# Patient Record
Sex: Female | Born: 1950 | Race: White | Hispanic: No | Marital: Single | Smoking: Former smoker
Health system: Southern US, Community
[De-identification: ages and names within clinical notes are randomized; demographics above are authoritative.]

## PROBLEM LIST (undated history)

## (undated) DIAGNOSIS — I10 Essential (primary) hypertension: Secondary | ICD-10-CM

## (undated) DIAGNOSIS — I509 Heart failure, unspecified: Secondary | ICD-10-CM

## (undated) DIAGNOSIS — M1990 Unspecified osteoarthritis, unspecified site: Secondary | ICD-10-CM

## (undated) DIAGNOSIS — R519 Headache, unspecified: Secondary | ICD-10-CM

## (undated) DIAGNOSIS — E785 Hyperlipidemia, unspecified: Secondary | ICD-10-CM

## (undated) DIAGNOSIS — F0150 Vascular dementia without behavioral disturbance: Secondary | ICD-10-CM

## (undated) DIAGNOSIS — F32A Depression, unspecified: Secondary | ICD-10-CM

## (undated) DIAGNOSIS — T7840XA Allergy, unspecified, initial encounter: Secondary | ICD-10-CM

## (undated) DIAGNOSIS — J449 Chronic obstructive pulmonary disease, unspecified: Secondary | ICD-10-CM

## (undated) DIAGNOSIS — D649 Anemia, unspecified: Secondary | ICD-10-CM

## (undated) DIAGNOSIS — G20A1 Parkinson's disease without dyskinesia, without mention of fluctuations: Secondary | ICD-10-CM

## (undated) DIAGNOSIS — F419 Anxiety disorder, unspecified: Secondary | ICD-10-CM

## (undated) DIAGNOSIS — I341 Nonrheumatic mitral (valve) prolapse: Secondary | ICD-10-CM

## (undated) DIAGNOSIS — N189 Chronic kidney disease, unspecified: Secondary | ICD-10-CM

## (undated) DIAGNOSIS — K219 Gastro-esophageal reflux disease without esophagitis: Secondary | ICD-10-CM

## (undated) DIAGNOSIS — G20 Parkinson's disease: Secondary | ICD-10-CM

## (undated) DIAGNOSIS — F329 Major depressive disorder, single episode, unspecified: Secondary | ICD-10-CM

## (undated) HISTORY — DX: Hyperlipidemia, unspecified: E78.5

## (undated) HISTORY — PX: KNEE SURGERY: SHX244

## (undated) HISTORY — DX: Nonrheumatic mitral (valve) prolapse: I34.1

## (undated) HISTORY — PX: APPENDECTOMY: SHX54

## (undated) HISTORY — DX: Gastro-esophageal reflux disease without esophagitis: K21.9

## (undated) HISTORY — DX: Anxiety disorder, unspecified: F41.9

## (undated) HISTORY — DX: Unspecified osteoarthritis, unspecified site: M19.90

## (undated) HISTORY — DX: Essential (primary) hypertension: I10

## (undated) HISTORY — DX: Depression, unspecified: F32.A

## (undated) HISTORY — PX: ABDOMINAL HYSTERECTOMY: SHX81

## (undated) HISTORY — PX: TUBAL LIGATION: SHX77

## (undated) HISTORY — DX: Anemia, unspecified: D64.9

## (undated) HISTORY — DX: Allergy, unspecified, initial encounter: T78.40XA

## (undated) HISTORY — DX: Major depressive disorder, single episode, unspecified: F32.9

## (undated) SURGERY — ESOPHAGOGASTRODUODENOSCOPY (EGD) WITH PROPOFOL
Anesthesia: Monitor Anesthesia Care

---

## 1986-12-17 HISTORY — PX: BREAST SURGERY: SHX581

## 1998-03-31 ENCOUNTER — Inpatient Hospital Stay (HOSPITAL_COMMUNITY): Admission: RE | Admit: 1998-03-31 | Discharge: 1998-04-01 | Payer: Self-pay | Admitting: Obstetrics and Gynecology

## 1999-01-12 ENCOUNTER — Other Ambulatory Visit: Admission: RE | Admit: 1999-01-12 | Discharge: 1999-01-12 | Payer: Self-pay | Admitting: *Deleted

## 1999-12-25 ENCOUNTER — Other Ambulatory Visit: Admission: RE | Admit: 1999-12-25 | Discharge: 1999-12-25 | Payer: Self-pay | Admitting: Obstetrics and Gynecology

## 2000-01-05 ENCOUNTER — Emergency Department (HOSPITAL_COMMUNITY): Admission: EM | Admit: 2000-01-05 | Discharge: 2000-01-05 | Payer: Self-pay | Admitting: Emergency Medicine

## 2000-01-05 ENCOUNTER — Encounter: Payer: Self-pay | Admitting: Emergency Medicine

## 2000-08-12 ENCOUNTER — Encounter: Payer: Self-pay | Admitting: Family Medicine

## 2000-08-12 ENCOUNTER — Encounter: Admission: RE | Admit: 2000-08-12 | Discharge: 2000-08-12 | Payer: Self-pay | Admitting: Family Medicine

## 2000-09-17 ENCOUNTER — Inpatient Hospital Stay (HOSPITAL_COMMUNITY): Admission: EM | Admit: 2000-09-17 | Discharge: 2000-09-23 | Payer: Self-pay | Admitting: Emergency Medicine

## 2000-09-17 ENCOUNTER — Encounter: Payer: Self-pay | Admitting: Emergency Medicine

## 2000-11-26 ENCOUNTER — Inpatient Hospital Stay (HOSPITAL_COMMUNITY): Admission: EM | Admit: 2000-11-26 | Discharge: 2000-11-28 | Payer: Self-pay | Admitting: Emergency Medicine

## 2000-11-26 ENCOUNTER — Encounter: Payer: Self-pay | Admitting: *Deleted

## 2001-01-01 ENCOUNTER — Ambulatory Visit (HOSPITAL_COMMUNITY): Admission: RE | Admit: 2001-01-01 | Discharge: 2001-01-01 | Payer: Self-pay | Admitting: Internal Medicine

## 2001-02-01 ENCOUNTER — Ambulatory Visit (HOSPITAL_COMMUNITY): Admission: RE | Admit: 2001-02-01 | Discharge: 2001-02-01 | Payer: Self-pay | Admitting: Family Medicine

## 2001-02-01 ENCOUNTER — Encounter: Payer: Self-pay | Admitting: Family Medicine

## 2001-03-10 ENCOUNTER — Encounter: Payer: Self-pay | Admitting: Neurology

## 2001-03-10 ENCOUNTER — Encounter: Admission: RE | Admit: 2001-03-10 | Discharge: 2001-03-10 | Payer: Self-pay | Admitting: Neurology

## 2001-06-10 ENCOUNTER — Emergency Department (HOSPITAL_COMMUNITY): Admission: EM | Admit: 2001-06-10 | Discharge: 2001-06-10 | Payer: Self-pay | Admitting: *Deleted

## 2001-12-12 ENCOUNTER — Emergency Department (HOSPITAL_COMMUNITY): Admission: EM | Admit: 2001-12-12 | Discharge: 2001-12-12 | Payer: Self-pay | Admitting: Emergency Medicine

## 2001-12-12 ENCOUNTER — Encounter: Payer: Self-pay | Admitting: Emergency Medicine

## 2002-02-10 ENCOUNTER — Emergency Department (HOSPITAL_COMMUNITY): Admission: EM | Admit: 2002-02-10 | Discharge: 2002-02-10 | Payer: Self-pay | Admitting: Emergency Medicine

## 2002-02-10 ENCOUNTER — Encounter: Payer: Self-pay | Admitting: Emergency Medicine

## 2002-08-06 ENCOUNTER — Encounter: Payer: Self-pay | Admitting: Specialist

## 2002-08-06 ENCOUNTER — Encounter: Admission: RE | Admit: 2002-08-06 | Discharge: 2002-08-06 | Payer: Self-pay | Admitting: *Deleted

## 2003-06-24 ENCOUNTER — Ambulatory Visit (HOSPITAL_COMMUNITY): Admission: RE | Admit: 2003-06-24 | Discharge: 2003-06-24 | Payer: Self-pay | Admitting: Specialist

## 2003-12-18 HISTORY — PX: KNEE SURGERY: SHX244

## 2005-12-07 ENCOUNTER — Emergency Department (HOSPITAL_COMMUNITY): Admission: EM | Admit: 2005-12-07 | Discharge: 2005-12-07 | Payer: Self-pay | Admitting: Emergency Medicine

## 2006-04-15 ENCOUNTER — Emergency Department (HOSPITAL_COMMUNITY): Admission: EM | Admit: 2006-04-15 | Discharge: 2006-04-16 | Payer: Self-pay | Admitting: Emergency Medicine

## 2006-04-19 ENCOUNTER — Emergency Department (HOSPITAL_COMMUNITY): Admission: EM | Admit: 2006-04-19 | Discharge: 2006-04-19 | Payer: Self-pay | Admitting: Emergency Medicine

## 2006-06-06 ENCOUNTER — Encounter: Admission: RE | Admit: 2006-06-06 | Discharge: 2006-06-06 | Payer: Self-pay | Admitting: Ophthalmology

## 2006-06-11 ENCOUNTER — Emergency Department (HOSPITAL_COMMUNITY): Admission: EM | Admit: 2006-06-11 | Discharge: 2006-06-12 | Payer: Self-pay | Admitting: Emergency Medicine

## 2006-07-29 ENCOUNTER — Emergency Department (HOSPITAL_COMMUNITY): Admission: EM | Admit: 2006-07-29 | Discharge: 2006-07-29 | Payer: Self-pay | Admitting: Emergency Medicine

## 2006-10-01 ENCOUNTER — Emergency Department (HOSPITAL_COMMUNITY): Admission: EM | Admit: 2006-10-01 | Discharge: 2006-10-01 | Payer: Self-pay | Admitting: Emergency Medicine

## 2006-10-29 ENCOUNTER — Emergency Department (HOSPITAL_COMMUNITY): Admission: EM | Admit: 2006-10-29 | Discharge: 2006-10-29 | Payer: Self-pay | Admitting: Emergency Medicine

## 2007-01-15 ENCOUNTER — Emergency Department (HOSPITAL_COMMUNITY): Admission: EM | Admit: 2007-01-15 | Discharge: 2007-01-15 | Payer: Self-pay | Admitting: Emergency Medicine

## 2007-04-04 ENCOUNTER — Emergency Department (HOSPITAL_COMMUNITY): Admission: EM | Admit: 2007-04-04 | Discharge: 2007-04-04 | Payer: Self-pay | Admitting: Emergency Medicine

## 2007-04-21 IMAGING — CT CT CHEST W/ CM
2 of 4 series · 15 of 36 positions shown, 18 images · IV contrast (APPLIED)
Comparison: Chest x-ray 04/15/2006

CLINICAL DATA: Abnormal chest x-ray with lingular density.

CHEST CT WITH CONTRAST
TECHNIQUE: Multidetector CT imaging of the chest was performed following the
standard protocol during bolus administration of intravenous contrast.
Contrast:  100 cc Omnipaque 300

[Series 2: routine chest 5.0 st · axial · 0.62mm/px · z∈[-357,-57]mm · 12 of 70 slices shown, 15 images]
[im 5/70  mediastinal]
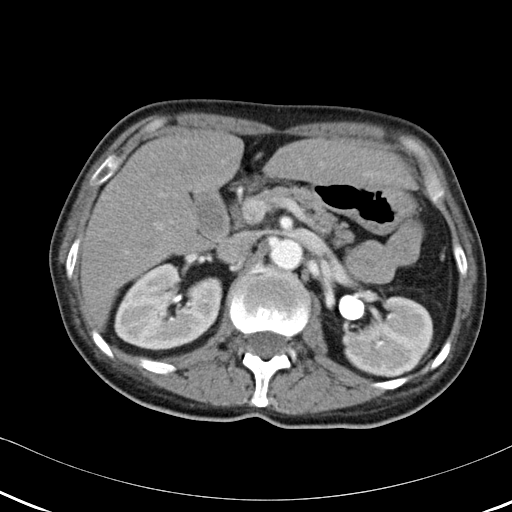
[im 5/70  lung]
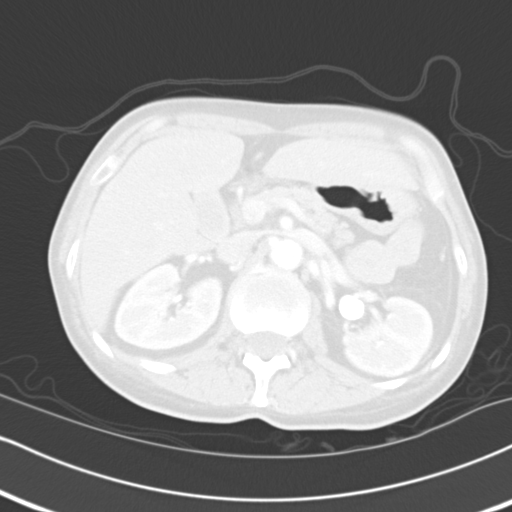
[im 10/70  lung]
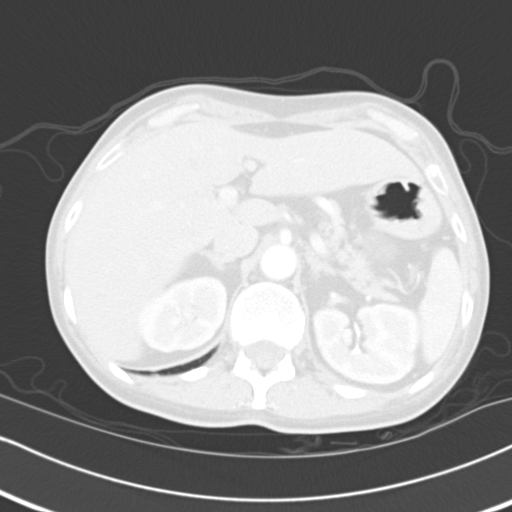
[im 15/70  lung]
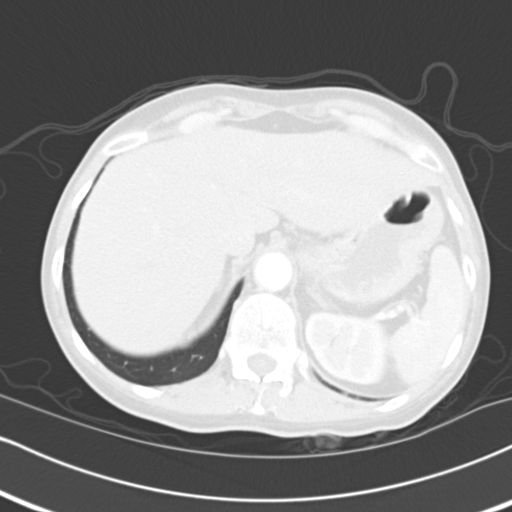
[im 20/70  lung]
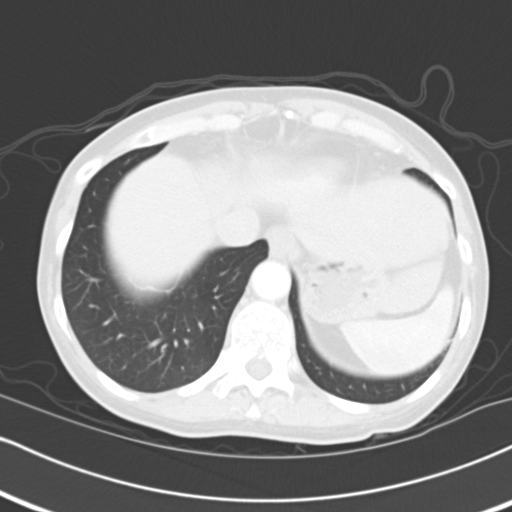
[im 25/70  mediastinal]
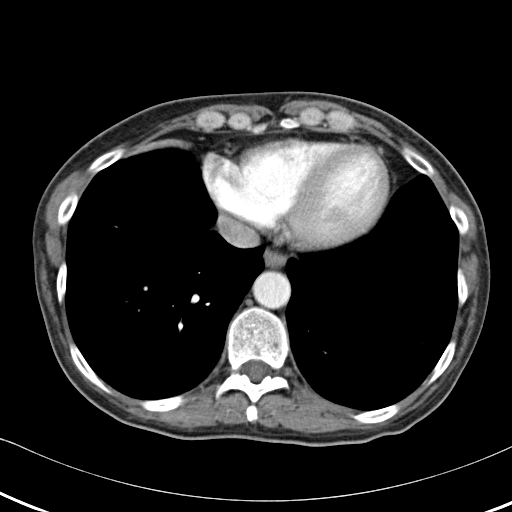
[im 25/70  lung]
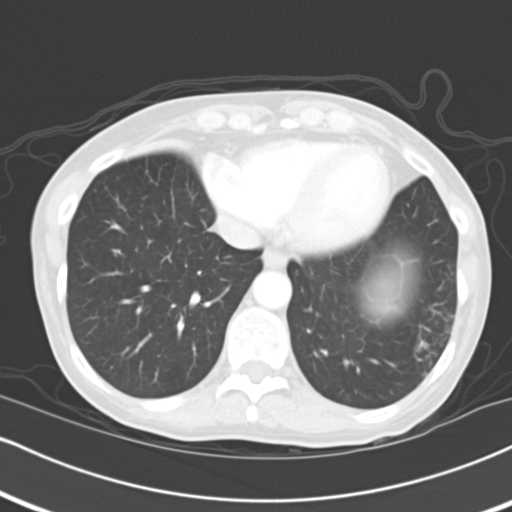
[im 30/70  lung]
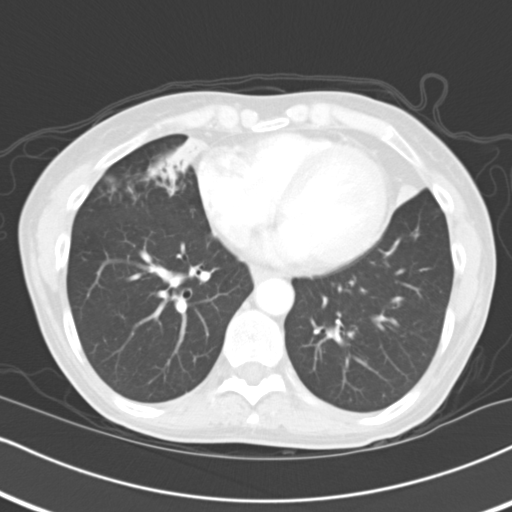
[im 40/70  lung]
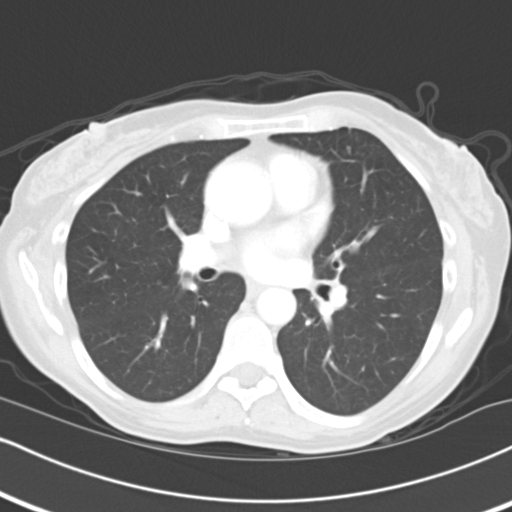
[im 45/70  lung]
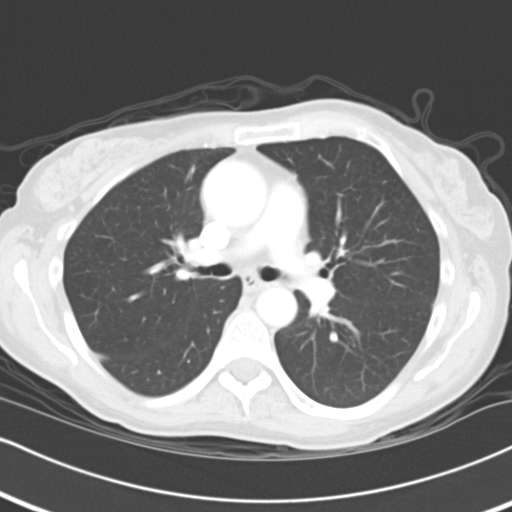
[im 50/70  mediastinal]
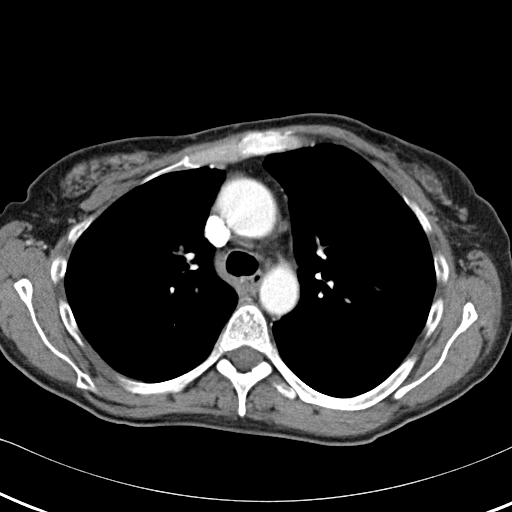
[im 50/70  lung]
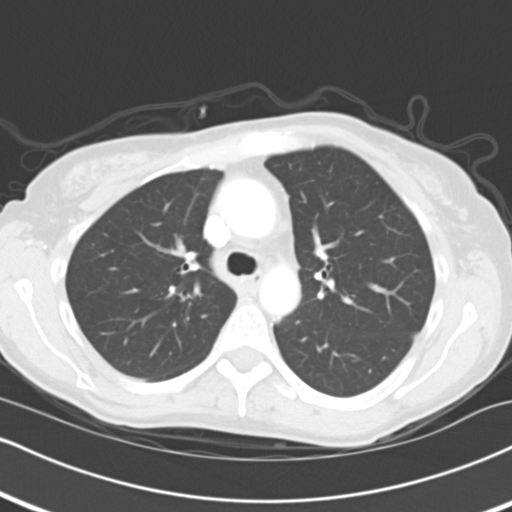
[im 55/70  lung]
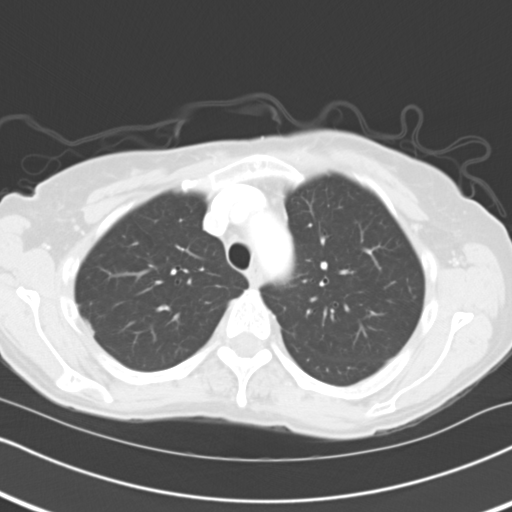
[im 60/70  lung]
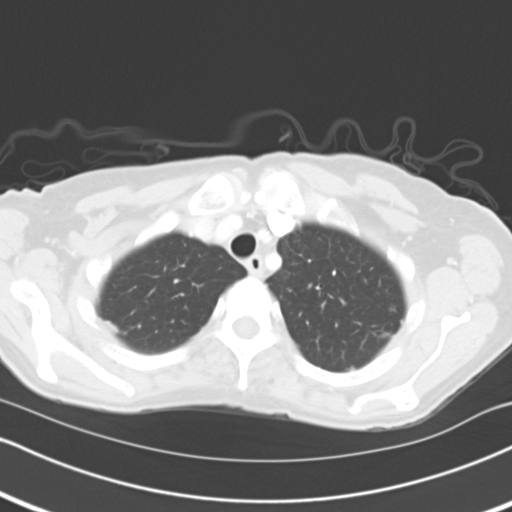
[im 65/70  lung]
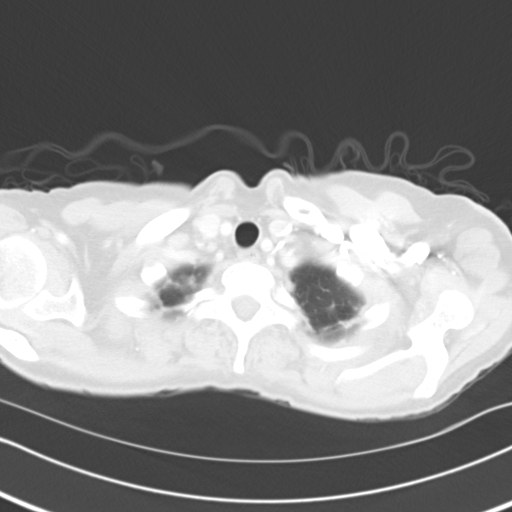

[Series 5: cor · coronal · 0.68mm/px · 3 of 103 slices shown]
[im 21/103  lung]
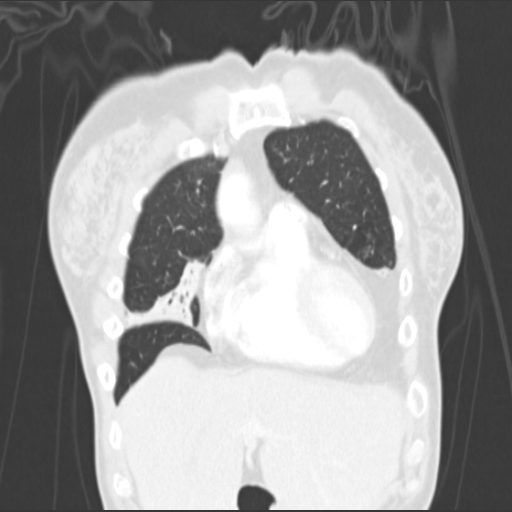
[im 41/103  lung]
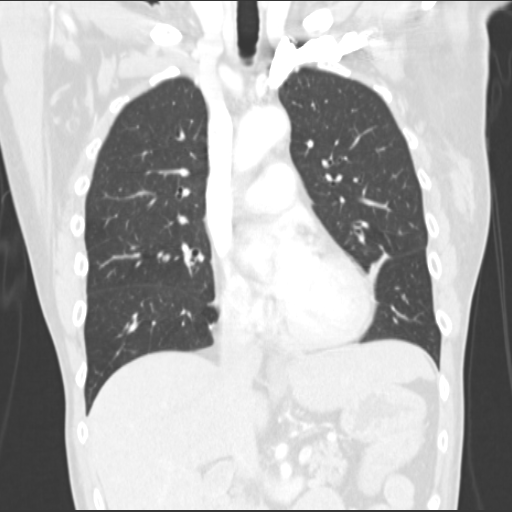
[im 62/103  lung]
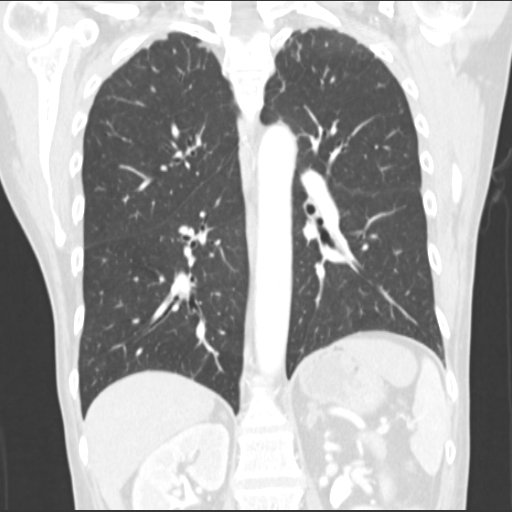

[15 of 36 positions shown; findings below may reference images not displayed]

FINDINGS: Both within the lingula and right middle lobe, there is airspace
disease with air bronchograms. This most likely atelectasis or scarring although
an inflammatory process/pneumonia cannot be completely excluded. No underlying
mass lesions seen to suggest a postobstructive process. No effusions.

There is a 5 mm nodule in the posterior left upper lobe on image 13 and a 2-3 mm
nodule in the right lower lobe on image 38.

Heart is normal size. No mediastinal, hilar, or axillary adenopathy.

IMPRESSION

Lingular and right middle lobe air space opacities. I favor these represent
atelectasis or possibly scarring although pneumonia is not excluded. Recommend
follow-up to resolution.

Two small nodules seen as described above, too small to characterize. These can
be followed with repeat chest CT in 6-12 months.

## 2007-07-29 ENCOUNTER — Emergency Department (HOSPITAL_COMMUNITY): Admission: EM | Admit: 2007-07-29 | Discharge: 2007-07-29 | Payer: Self-pay | Admitting: Emergency Medicine

## 2007-09-20 ENCOUNTER — Emergency Department (HOSPITAL_COMMUNITY): Admission: EM | Admit: 2007-09-20 | Discharge: 2007-09-20 | Payer: Self-pay | Admitting: Emergency Medicine

## 2007-11-18 ENCOUNTER — Ambulatory Visit: Payer: Self-pay | Admitting: Internal Medicine

## 2007-11-18 ENCOUNTER — Observation Stay (HOSPITAL_COMMUNITY): Admission: AD | Admit: 2007-11-18 | Discharge: 2007-11-19 | Payer: Self-pay | Admitting: Family Medicine

## 2007-12-12 ENCOUNTER — Encounter: Admission: RE | Admit: 2007-12-12 | Discharge: 2007-12-12 | Payer: Self-pay | Admitting: Internal Medicine

## 2008-01-09 ENCOUNTER — Ambulatory Visit: Payer: Self-pay | Admitting: Internal Medicine

## 2008-01-14 ENCOUNTER — Ambulatory Visit: Payer: Self-pay | Admitting: Internal Medicine

## 2008-01-14 ENCOUNTER — Encounter: Payer: Self-pay | Admitting: Internal Medicine

## 2008-01-19 ENCOUNTER — Ambulatory Visit (HOSPITAL_COMMUNITY): Admission: RE | Admit: 2008-01-19 | Discharge: 2008-01-19 | Payer: Self-pay | Admitting: Internal Medicine

## 2008-01-21 IMAGING — CT CT PELVIS W/ CM
2 of 4 series · 14 of 32 positions shown, 19 images · IV contrast ([ID]/WATER & 100 ML OMNI 300)
Comparison: 04/15/06.

CLINICAL DATA: Severe right lower quadrant pain with nausea. 
ABDOMEN CT WITH CONTRAST:
TECHNIQUE: Multidetector CT imaging of the abdomen was performed following the standard protocol during bolus administration of intravenous contrast.
Contrast:  100 cc Omnipaque 300.
TECHNIQUE: Multidetector CT imaging of the pelvis was performed following the standard protocol during bolus administration of intravenous contrast.

[Series 2: routine abdomen · axial · 0.76mm/px · z∈[-474,-169]mm · 6 of 87 slices shown, 11 images]
[im 13/87  soft-tissue]
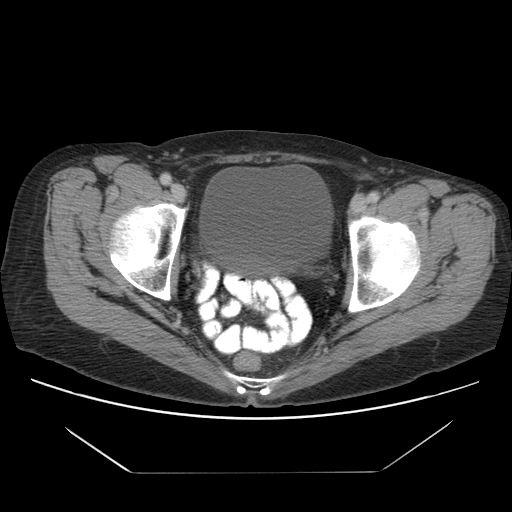
[im 13/87  bone]
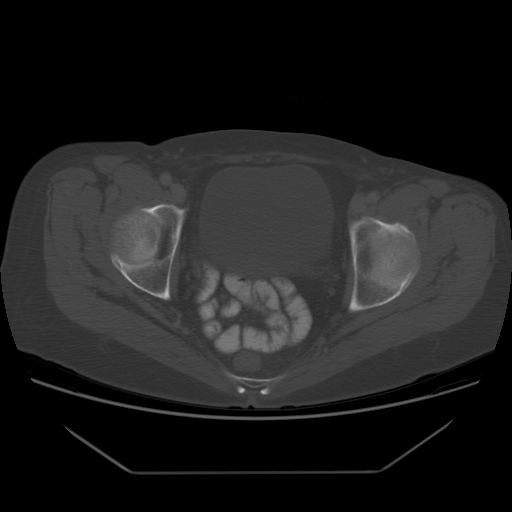
[im 25/87  soft-tissue]
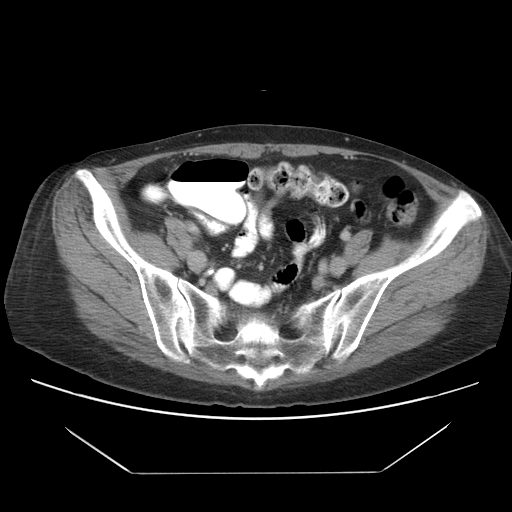
[im 37/87  soft-tissue]
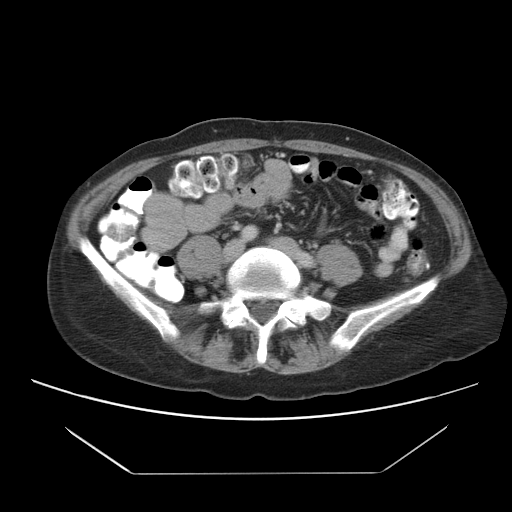
[im 37/87  lung]
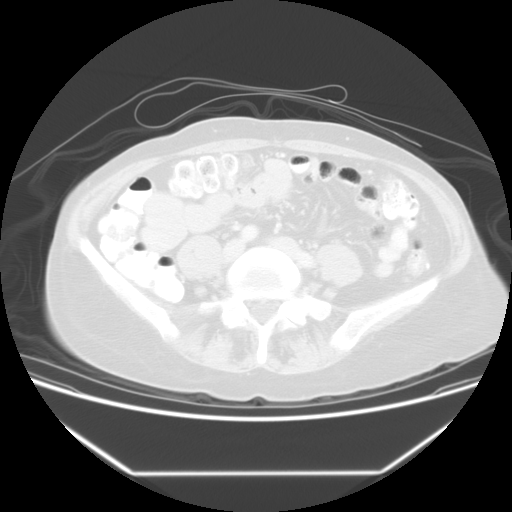
[im 50/87  soft-tissue]
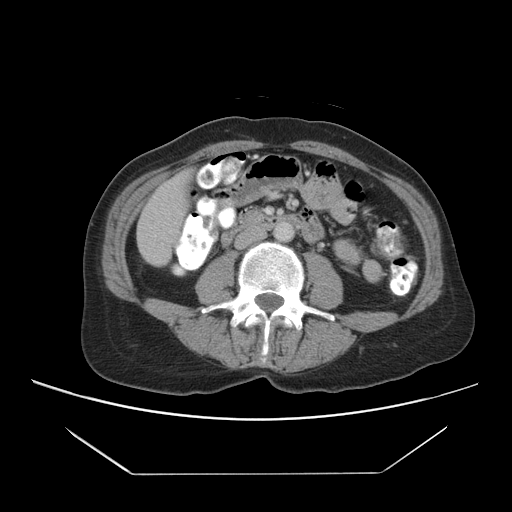
[im 50/87  lung]
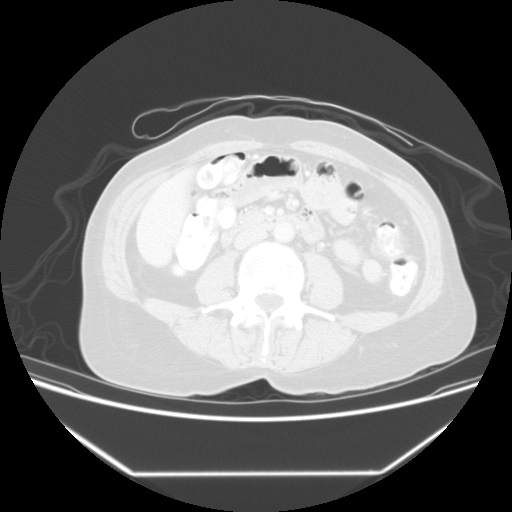
[im 62/87  soft-tissue]
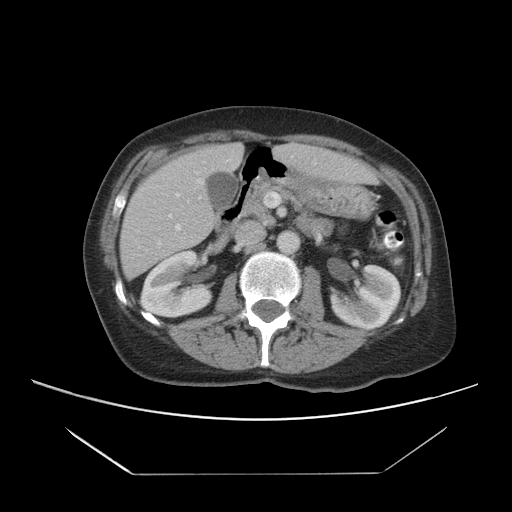
[im 62/87  lung]
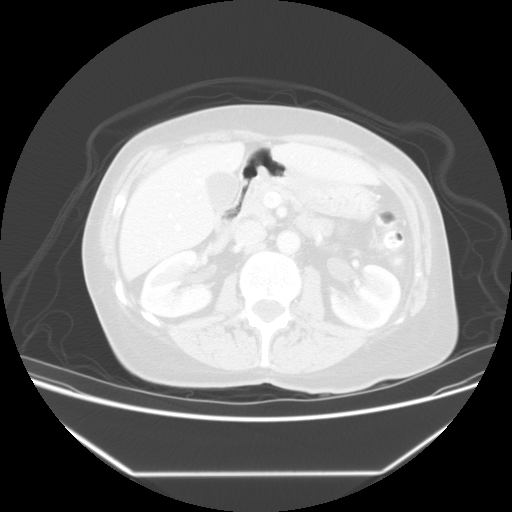
[im 74/87  soft-tissue]
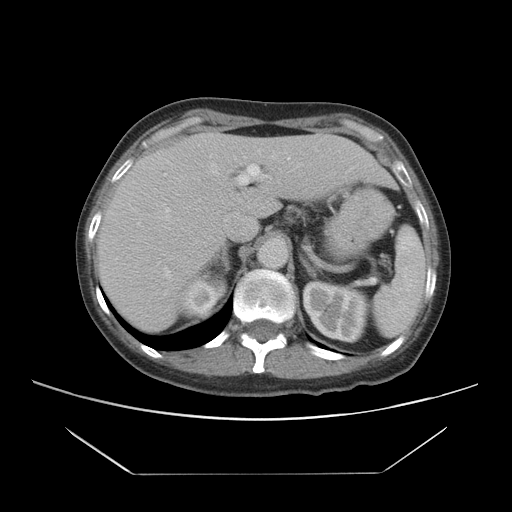
[im 74/87  lung]
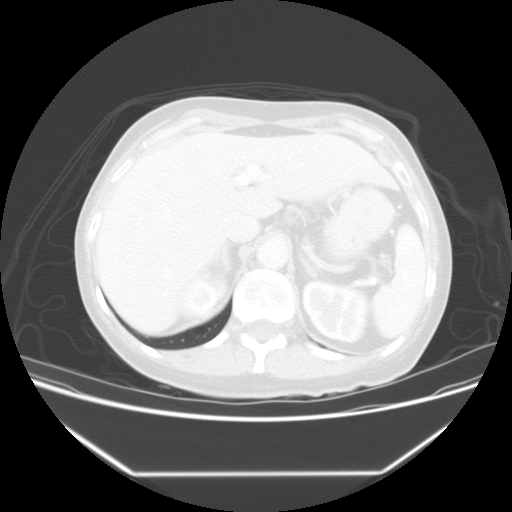

[Series 400: sagittal a/p · sagittal · 0.90mm/px · 8 of 139 slices shown]
[im 13/139  soft-tissue]
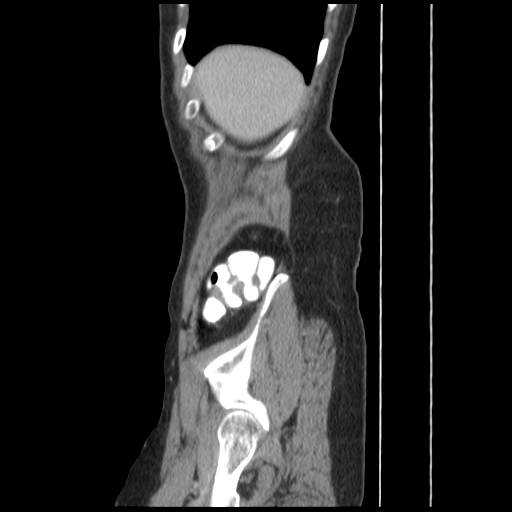
[im 26/139  soft-tissue]
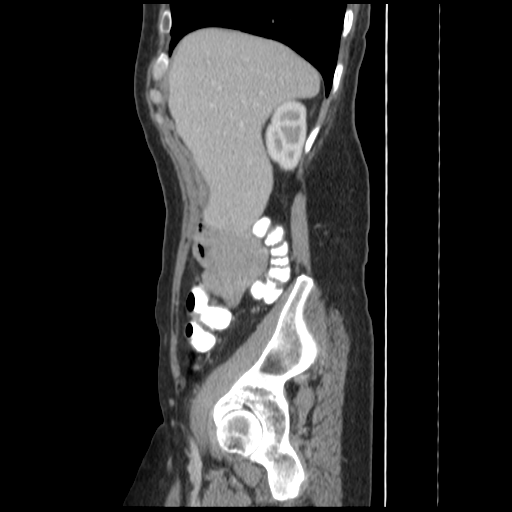
[im 51/139  soft-tissue]
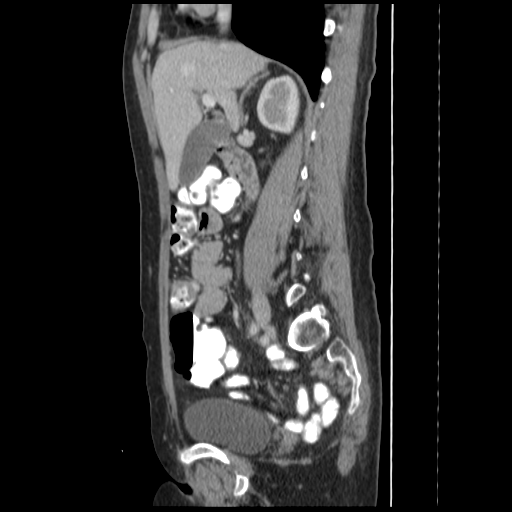
[im 63/139  soft-tissue]
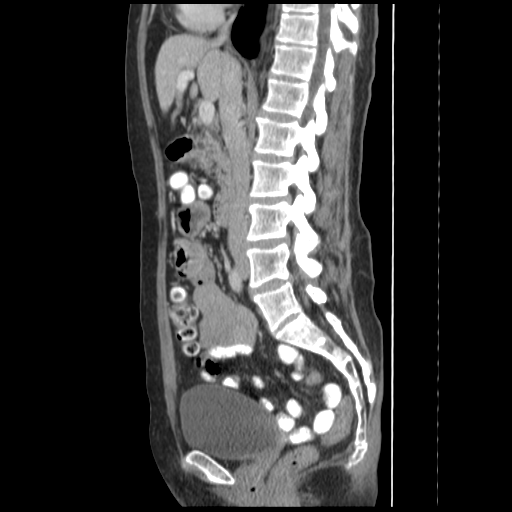
[im 76/139  soft-tissue]
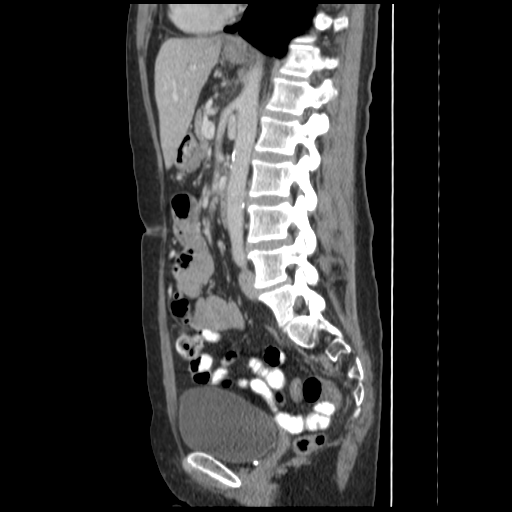
[im 88/139  soft-tissue]
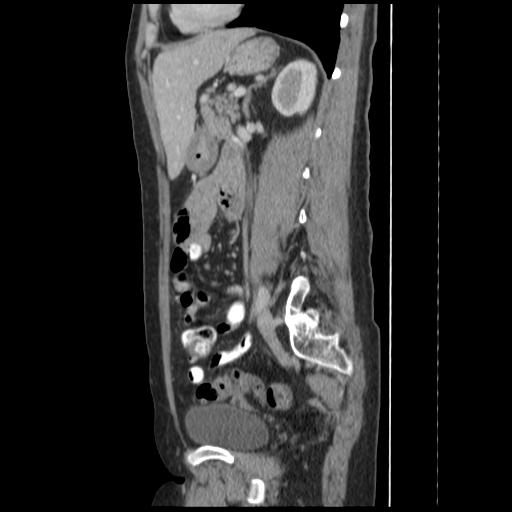
[im 113/139  soft-tissue]
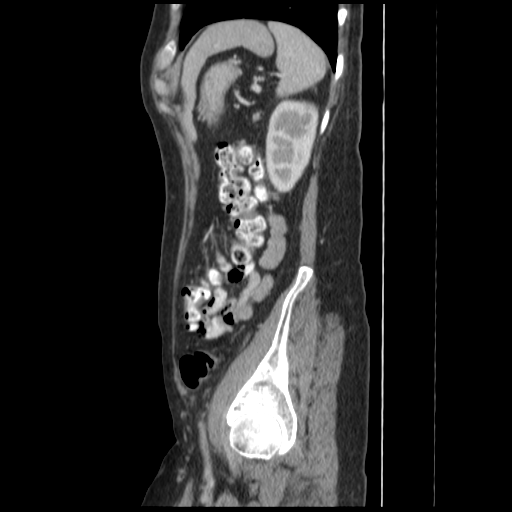
[im 126/139  soft-tissue]
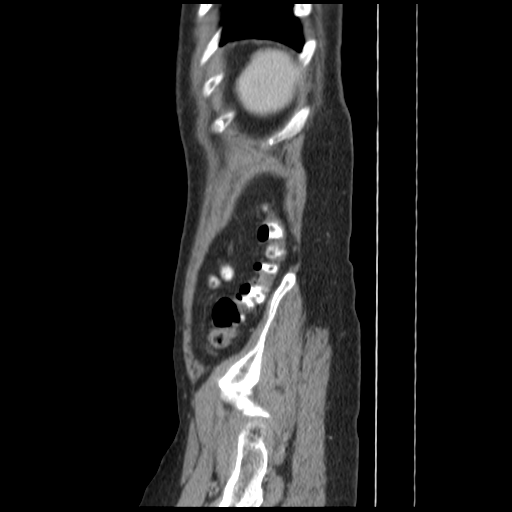

[14 of 32 positions shown; findings below may reference images not displayed]

FINDINGS: The liver measures 18.5 cm.  No focal lesion.  The gallbladder, adrenal glands, kidneys, spleen, pancreas unremarkable.  There is thickening of the gastric antrum measuring 11.5 cm.  This is similar to 04/15/06. 
Small bowel unremarkable.  No pathologically enlarged lymph nodes.  No free fluid.
IMPRESSION: 1.  Persistent gastric antral wall thickening. Upper endoscopy may be useful in further evaluation, as clinically indicated.  
2.  Hepatomegaly.
PELVIS CT WITH CONTRAST:
FINDINGS: Colon unremarkable.  Appendix is not visualized.  No free fluid.  The uterus has been removed.   No pathologically enlarged lymph nodes.  No worrisome lytic or sclerotic lesions.
IMPRESSION: No acute findings.

## 2008-02-04 ENCOUNTER — Ambulatory Visit (HOSPITAL_COMMUNITY): Admission: RE | Admit: 2008-02-04 | Discharge: 2008-02-04 | Payer: Self-pay | Admitting: Internal Medicine

## 2008-02-11 ENCOUNTER — Ambulatory Visit: Payer: Self-pay | Admitting: Internal Medicine

## 2008-04-09 IMAGING — CR DG KNEE COMPLETE 4+V*L*
4 series · 4 of 4 positions shown · non-contrast
Comparison: None.

Examination: Left knee, 4 views.
COMPARISON: None

HISTORY: Fall.

[t knee ap left]
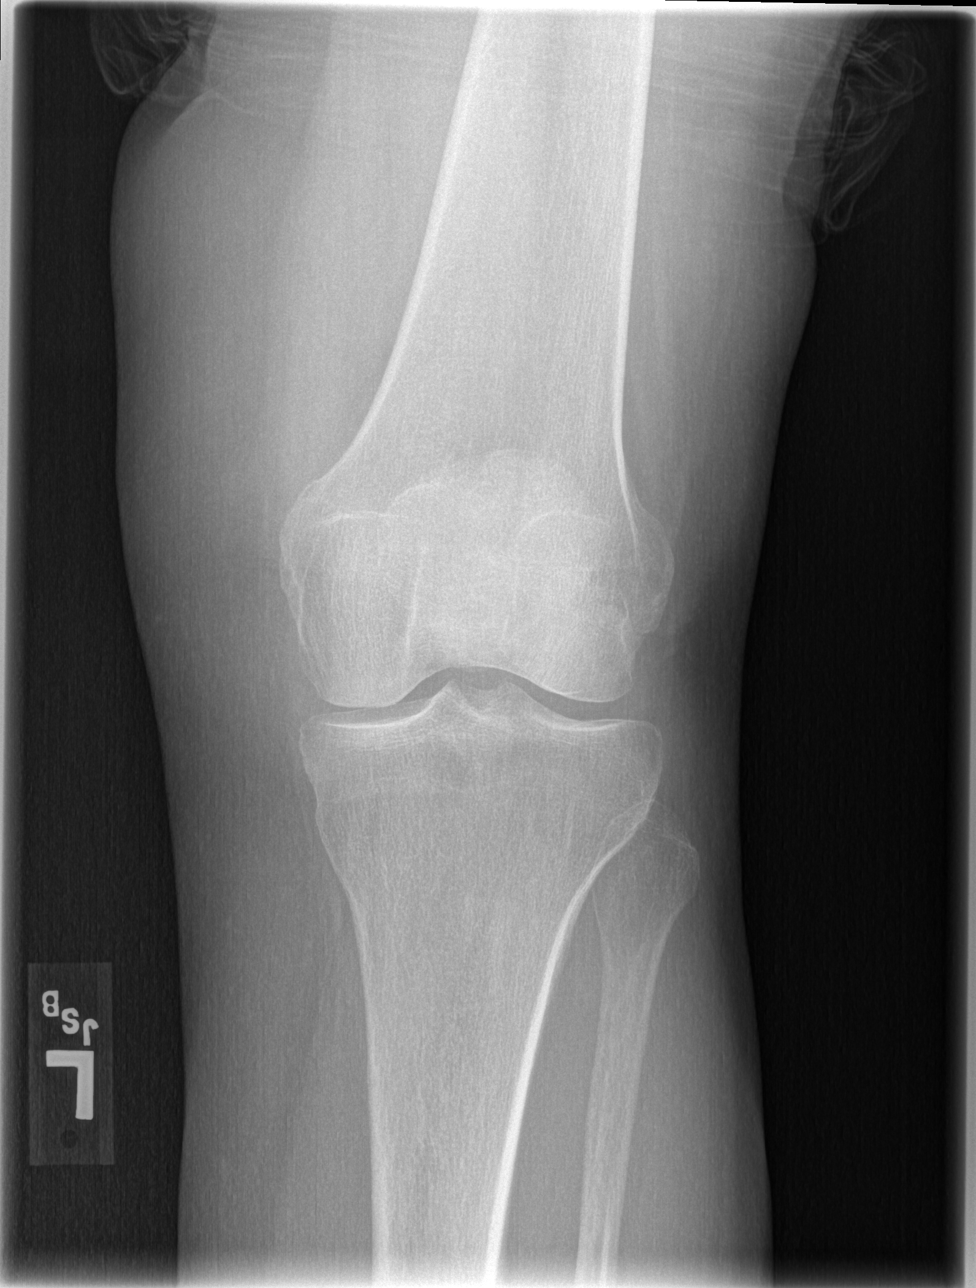

[t knee oblique left (1 of 2)]
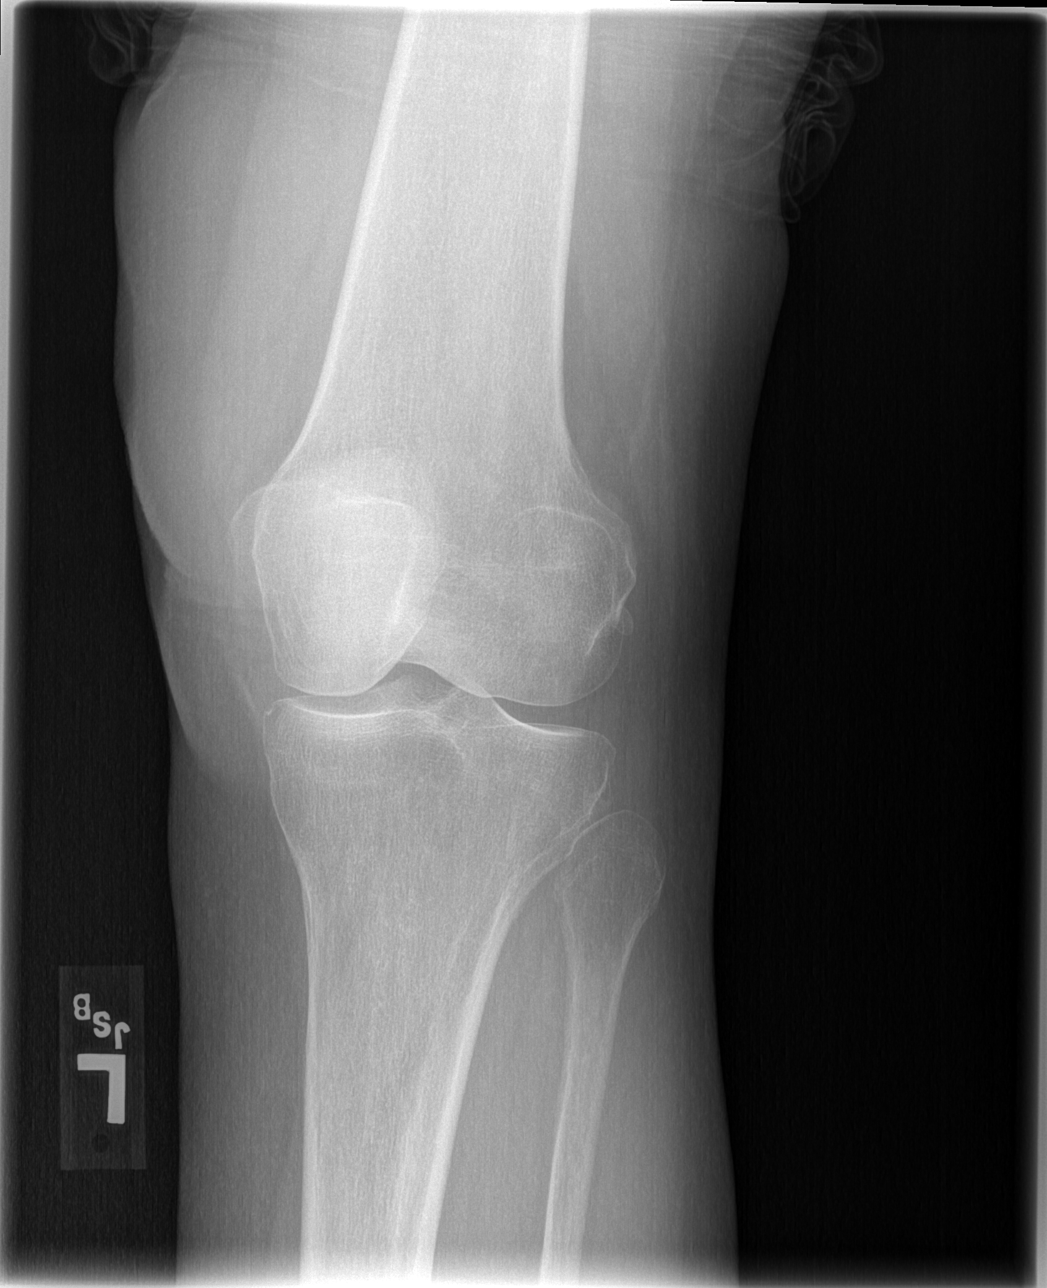

[t knee oblique left (2 of 2)]
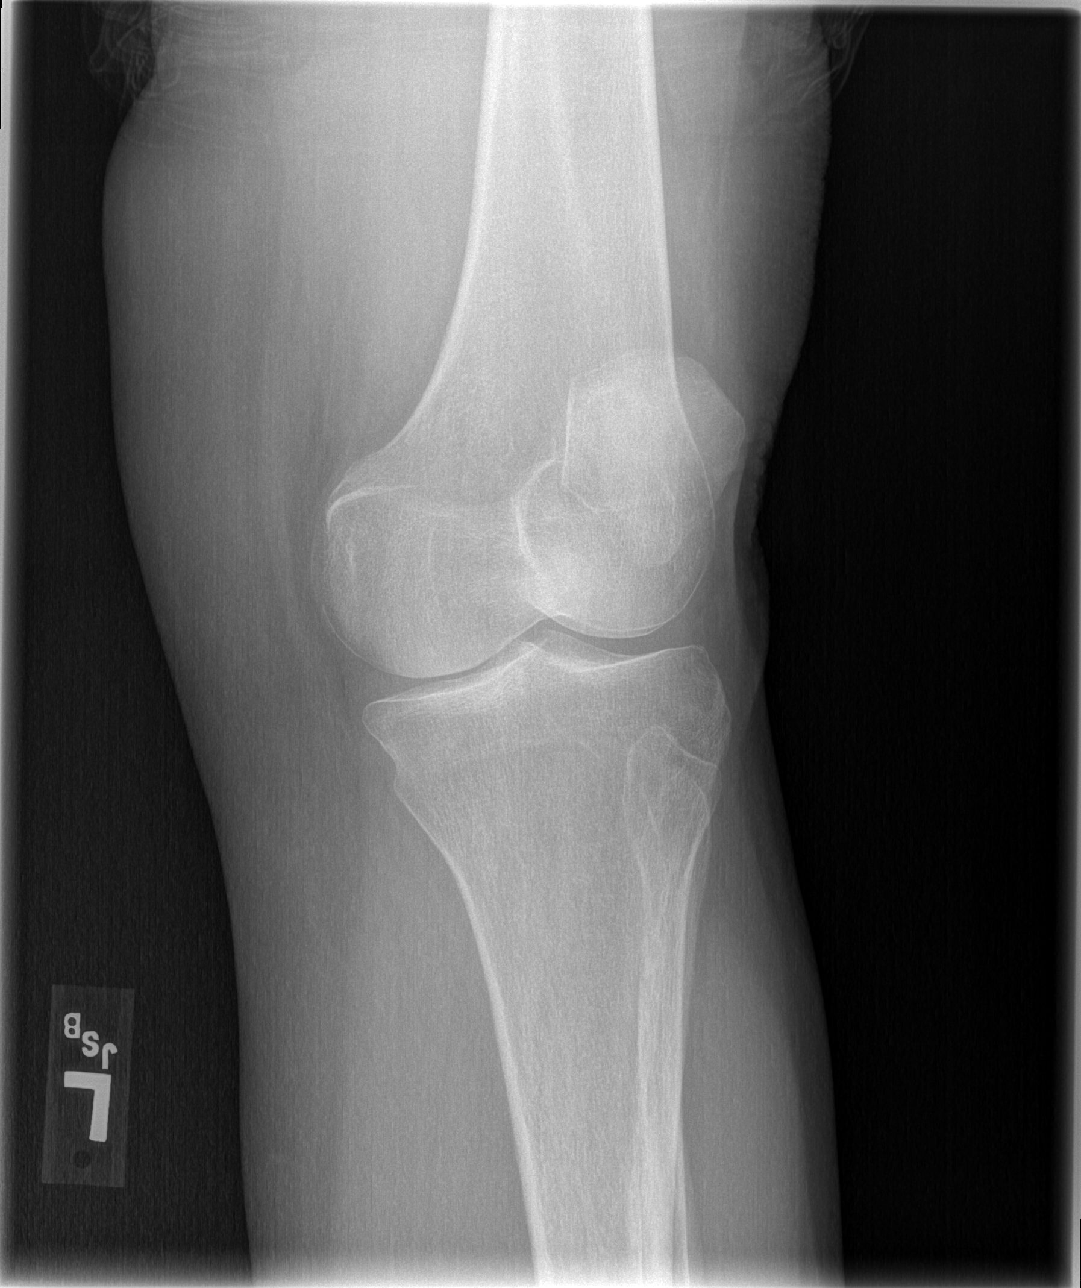

[t knee lat left]
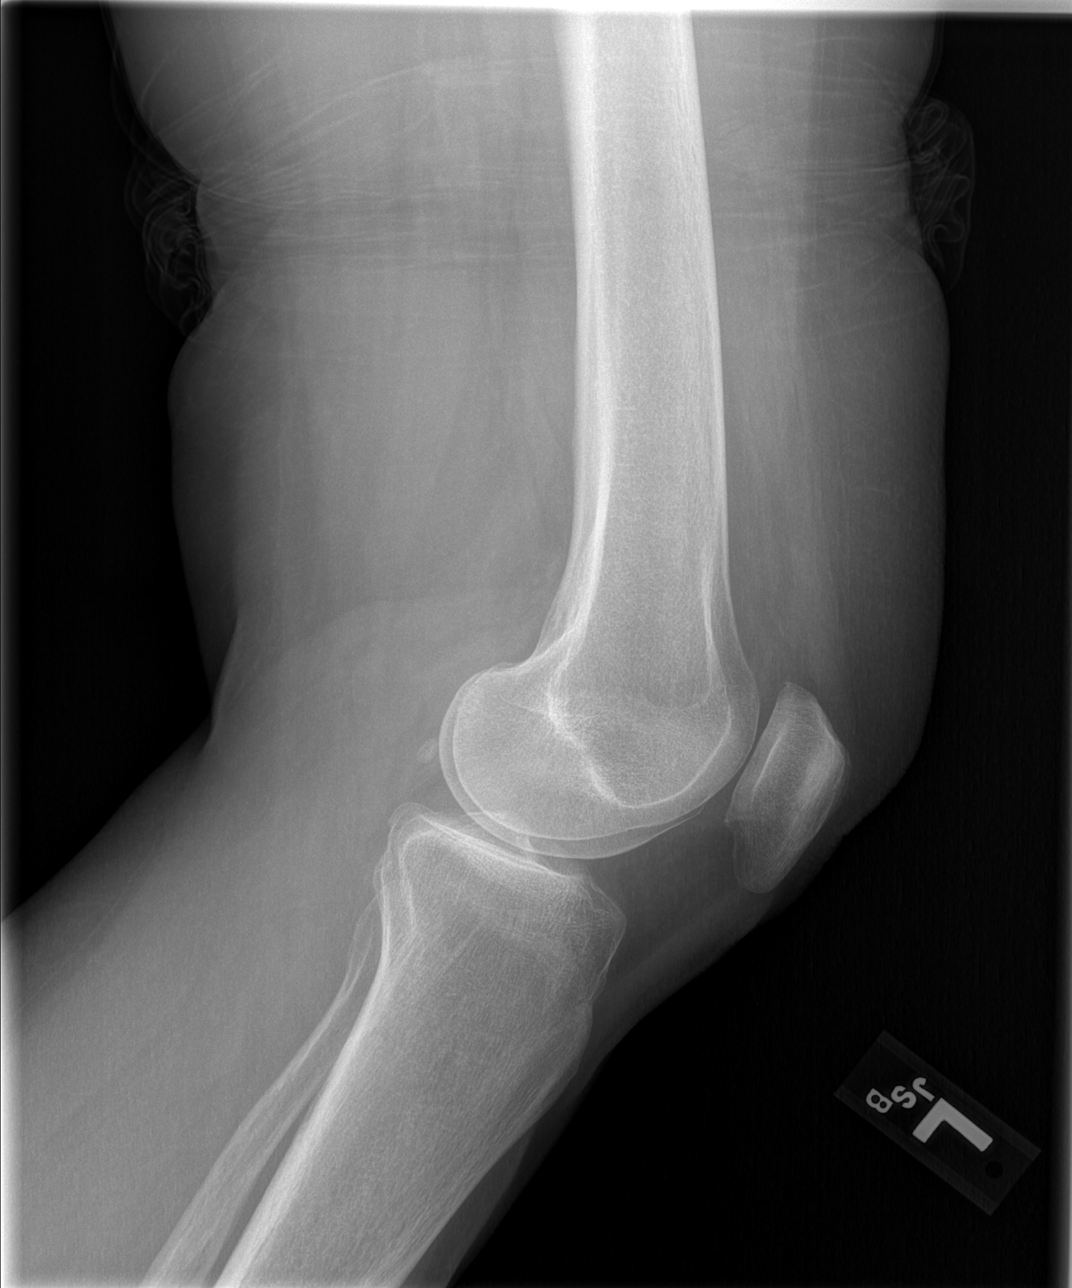

[4 of 4 positions shown; findings below may reference images not displayed]

FINDINGS: There is no joint effusion.

The quadriceps and patellar tendons are intact.

Bones are mildly osteopenic.
IMPRESSION: 1. No acute osseous abnormality.

## 2008-08-03 IMAGING — CR DG THORACIC SPINE 2V
3 series · 3 of 3 positions shown · non-contrast
Comparison: None.

CLINICAL DATA: 56-yaer-old with back pain.
 THORACIC SPINE ? 3 VIEW:

[view not recorded (1 of 3)]
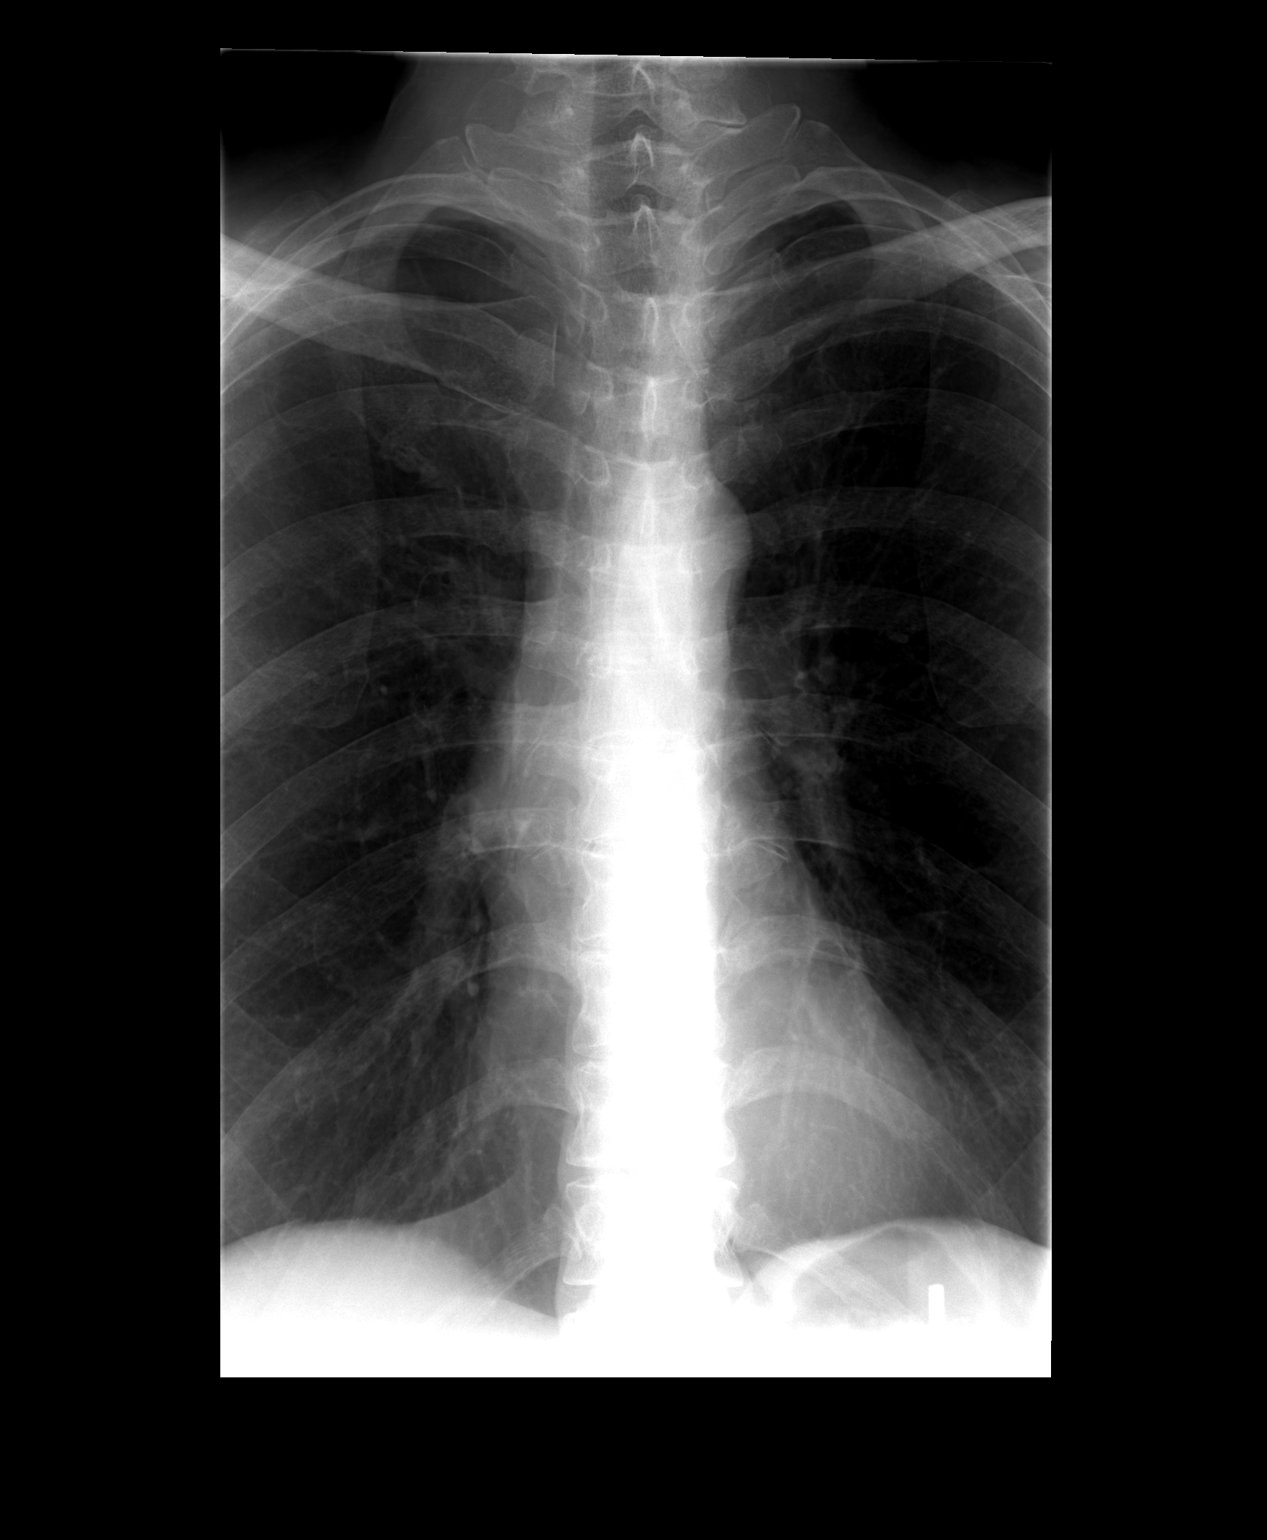

[view not recorded (2 of 3)]
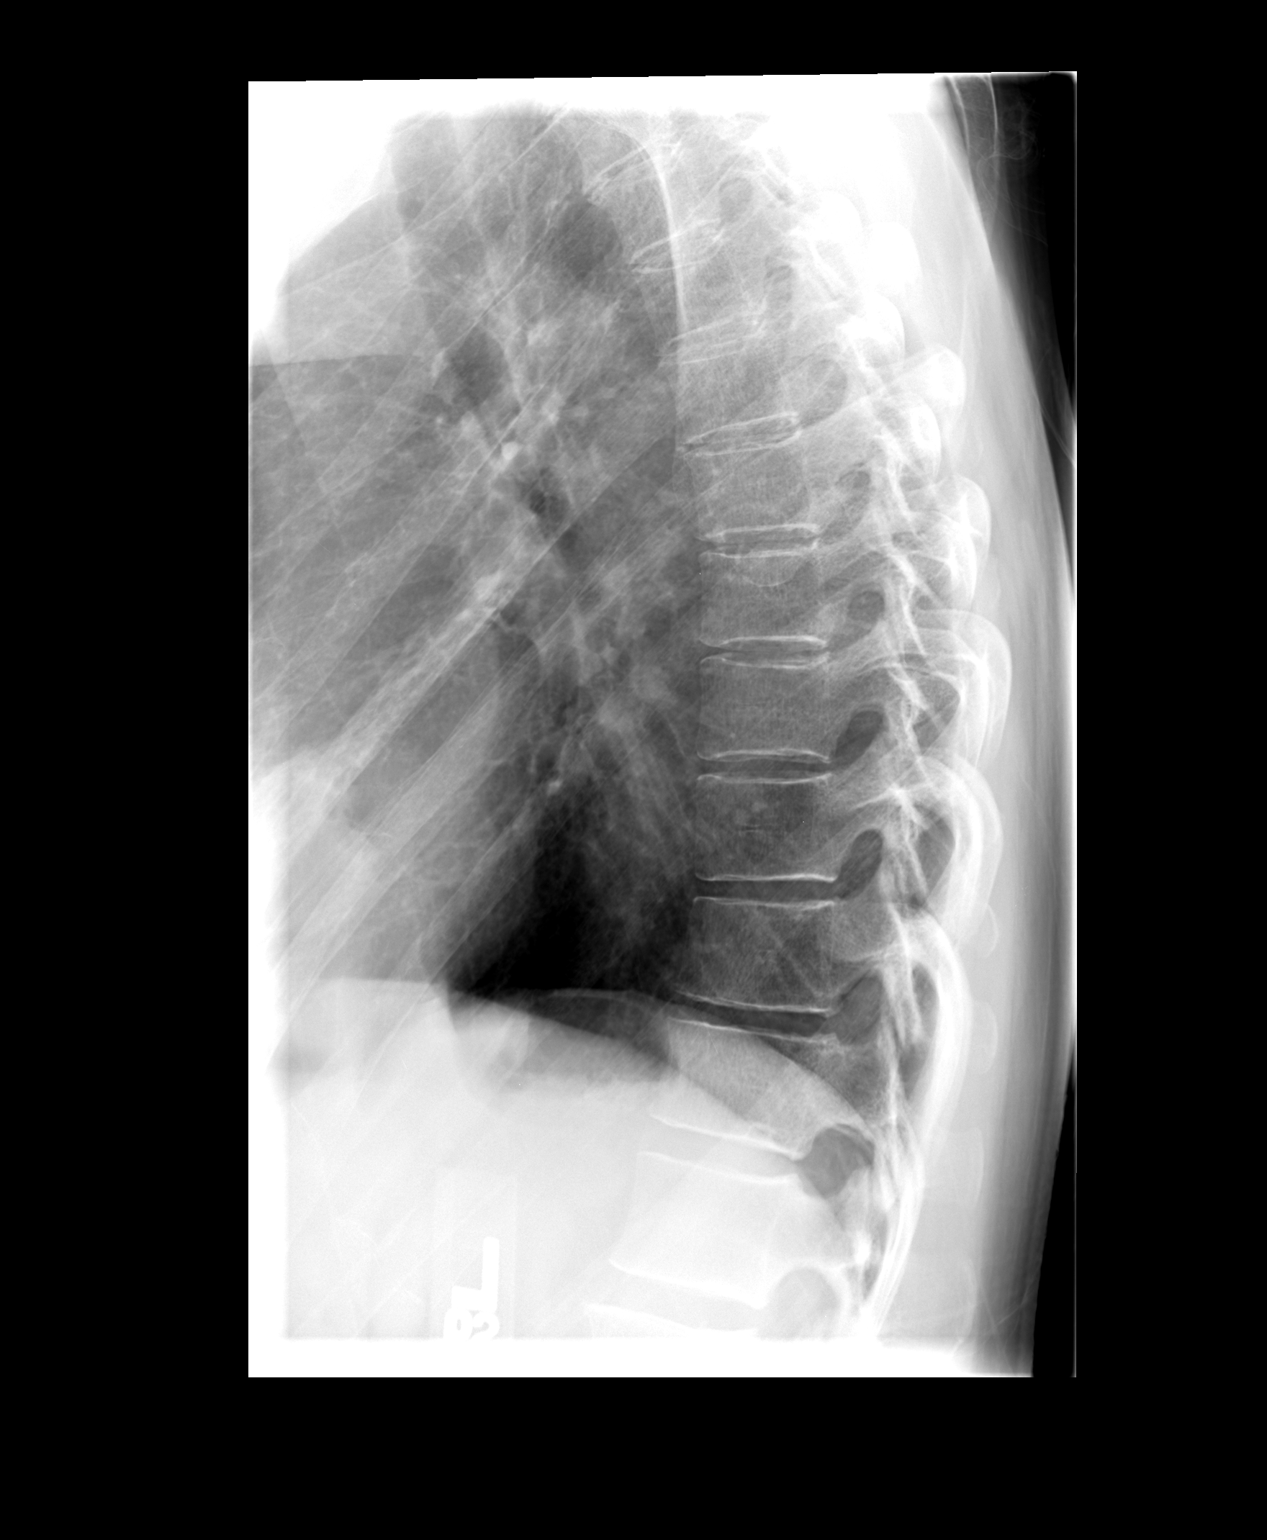

[view not recorded (3 of 3)]
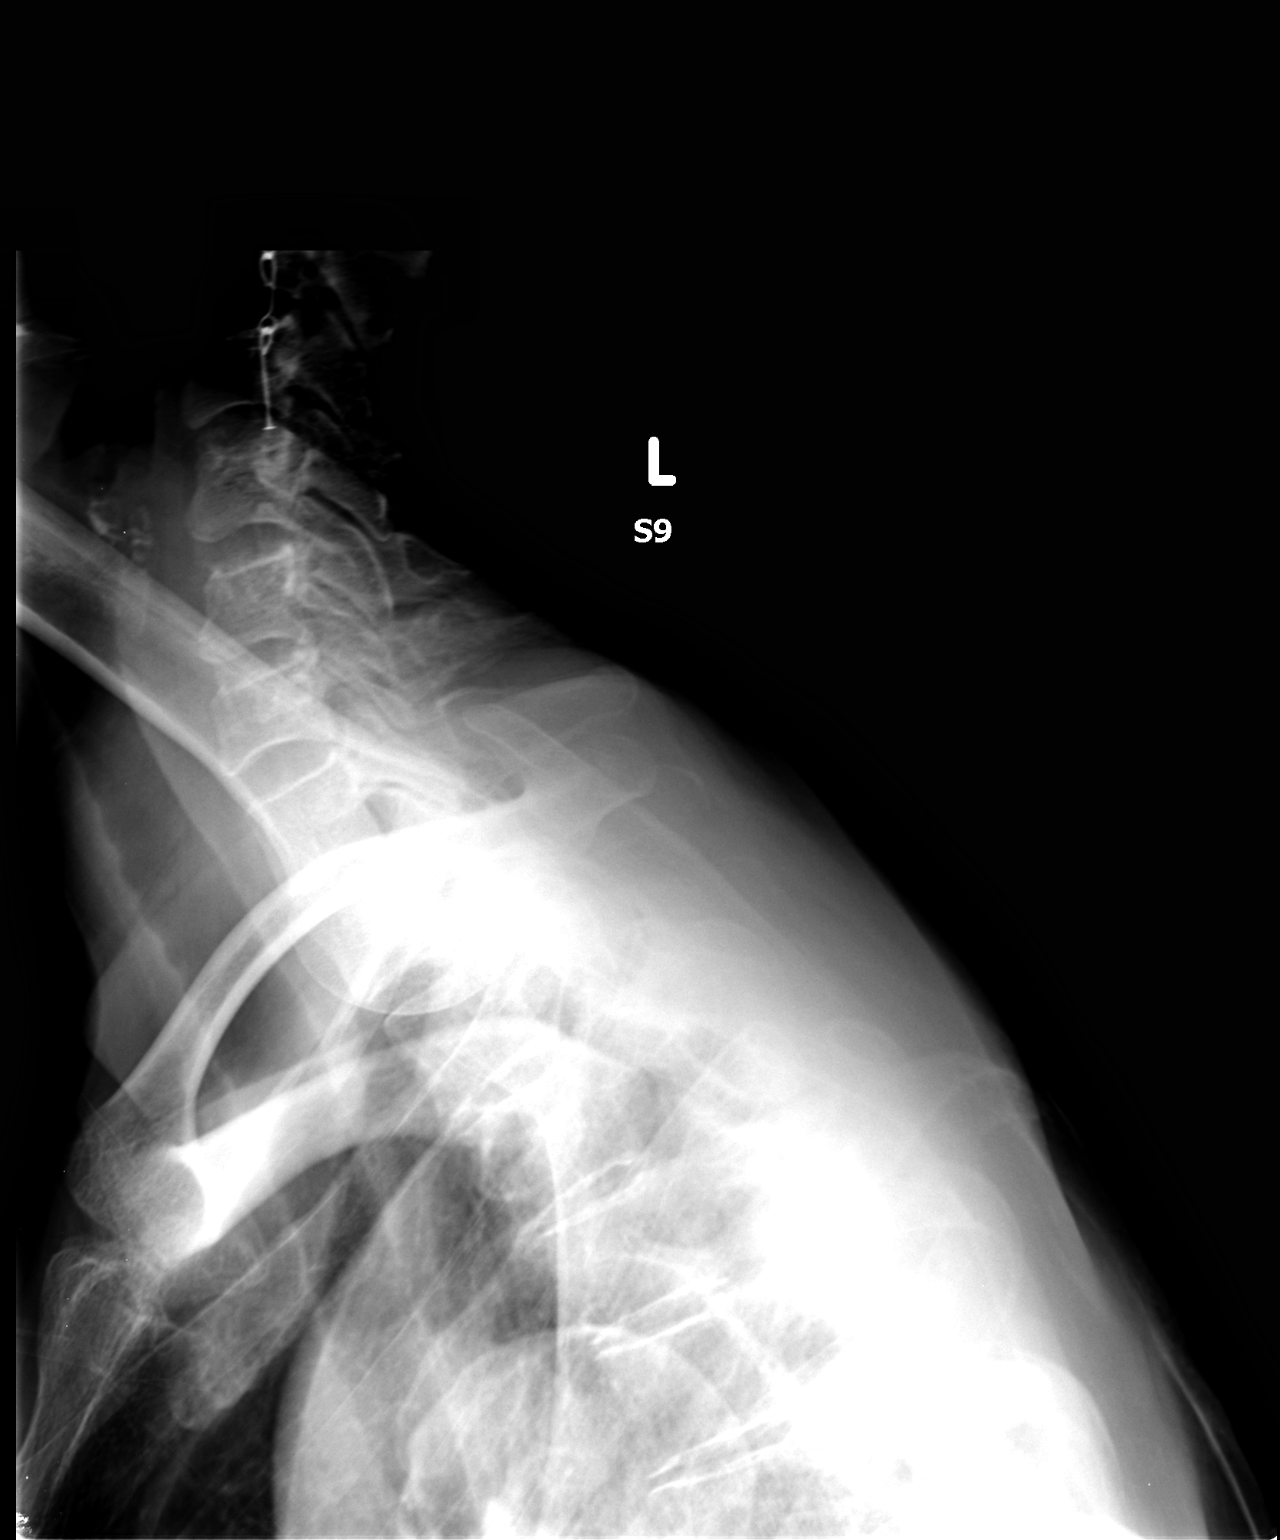

[3 of 3 positions shown; findings below may reference images not displayed]

FINDINGS: The lateral film demonstrates normal alignment.   Disc spaces and vertebral bodies are maintained.  Minimal degenerative changes.  No abnormal paraspinal soft tissue swelling.  Visualized posterior ribs are grossly normal and the visualized lungs are unremarkable.
IMPRESSION: Normal alignment and no acute bony findings.

## 2008-09-25 IMAGING — CR DG CHEST 2V
2 series · 2 of 2 positions shown · non-contrast
Comparison: Thoracic spine of 07/29/07.

CLINICAL DATA: Coughing up blood.
 CHEST ? 2 VIEW:

[w chest pa]
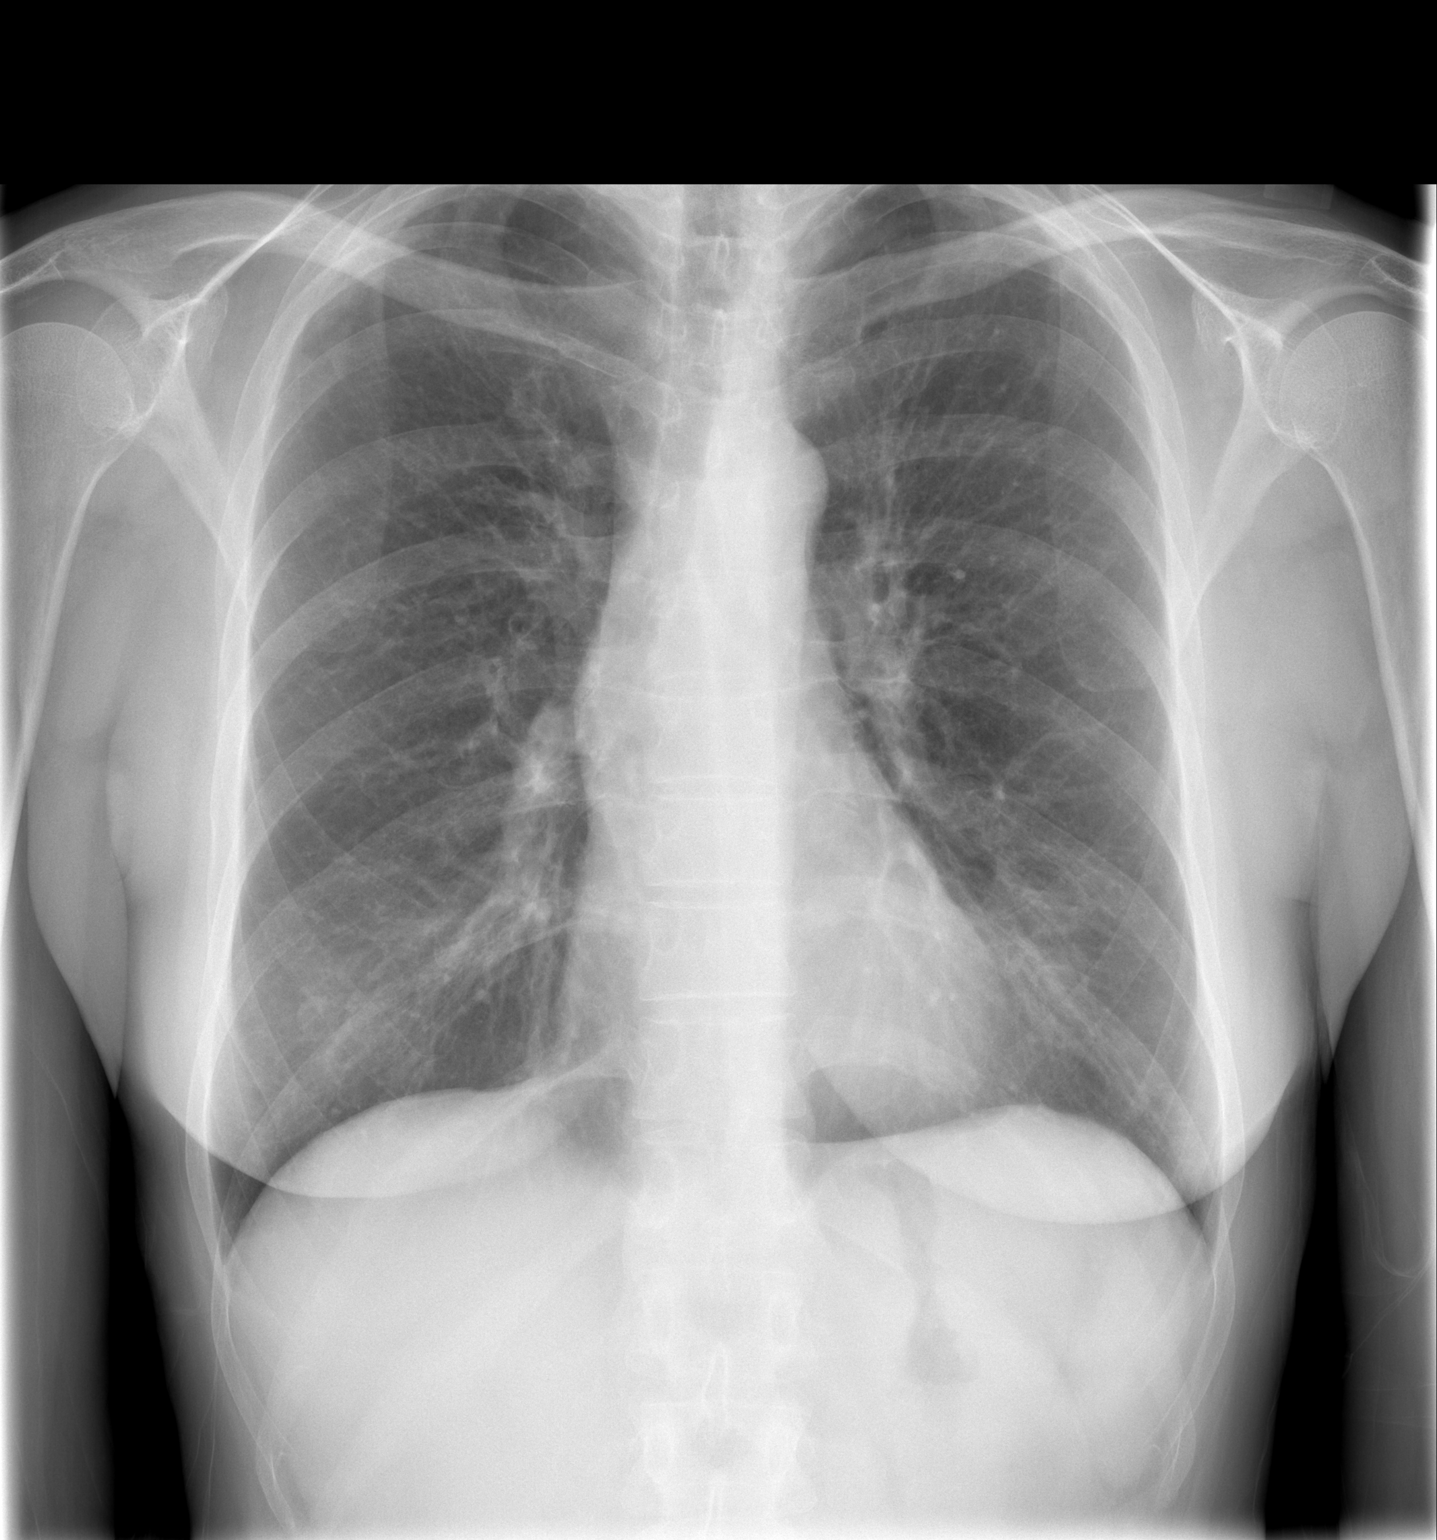

[w chest lat]
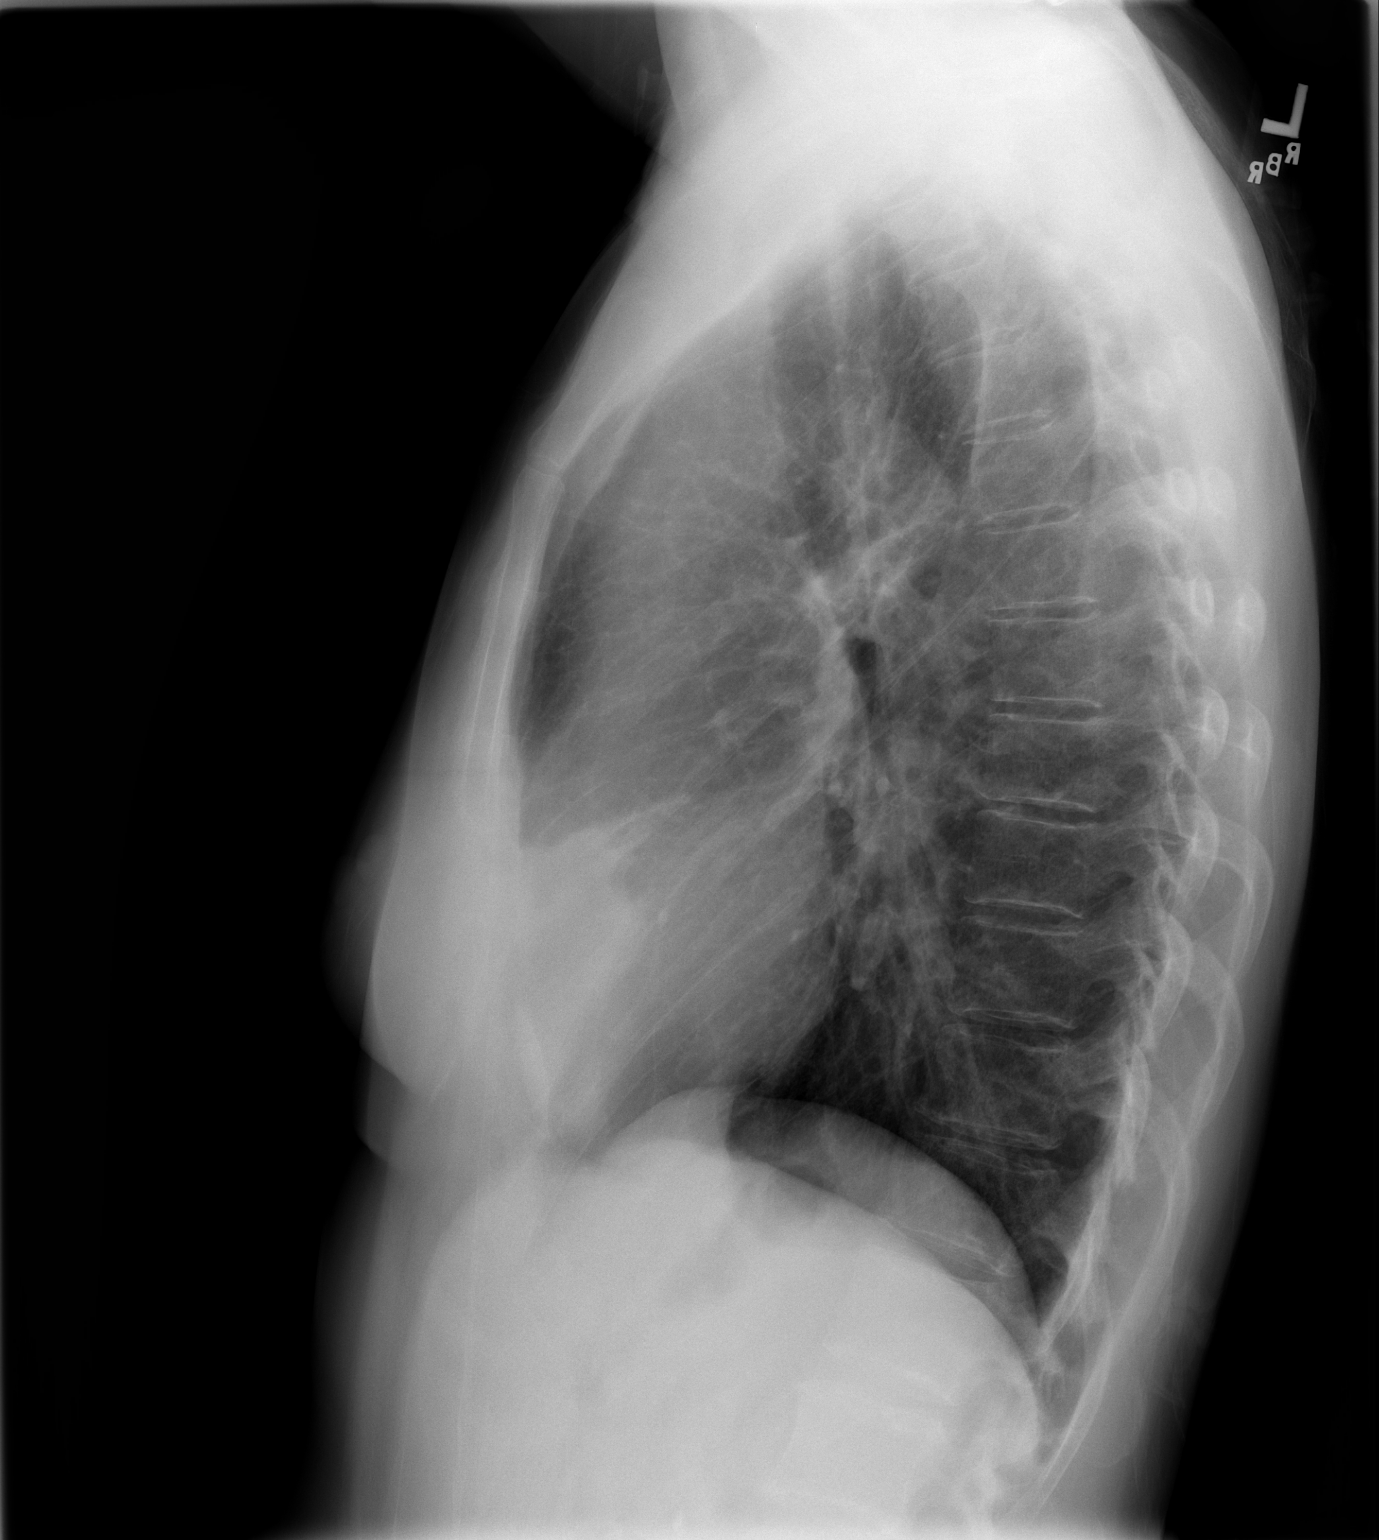

[2 of 2 positions shown; findings below may reference images not displayed]

FINDINGS: Normal mediastinum and cardiac silhouette.  Costophrenic angles are clear.  No evidence of effusion, infiltrate or pneumothorax.  No bony abnormality.
IMPRESSION: No acute cardiopulmonary disease.

## 2008-12-17 IMAGING — CT CT ABDOMEN W/ CM
2 of 5 series · 17 of 46 positions shown, 19 images · IV contrast (READICAT/WATER & [ID] OMNI 300)
Comparison: 01/15/07.

CLINICAL DATA: Lower abdominal pain for several weeks.  Previous hysterotomy and appendectomy.
 ABDOMEN CT WITH CONTRAST:
TECHNIQUE: Multidetector CT imaging of the abdomen was performed following the standard protocol during bolus administration of intravenous contrast.
 Contrast:  100 cc of Omnipaque 300.
TECHNIQUE: Multidetector CT imaging of the pelvis was performed following the standard protocol during bolus administration of intravenous contrast.

[Series 3: routine abdomen · axial · 0.70mm/px · z∈[-444,-44]mm · 14 of 88 slices shown, 16 images]
[im 5/88  soft-tissue]
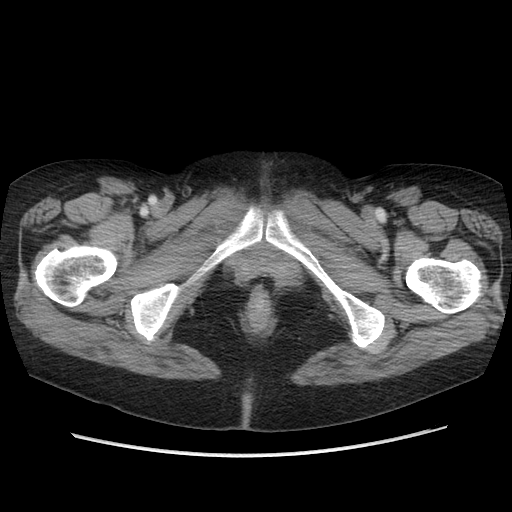
[im 5/88  bone]
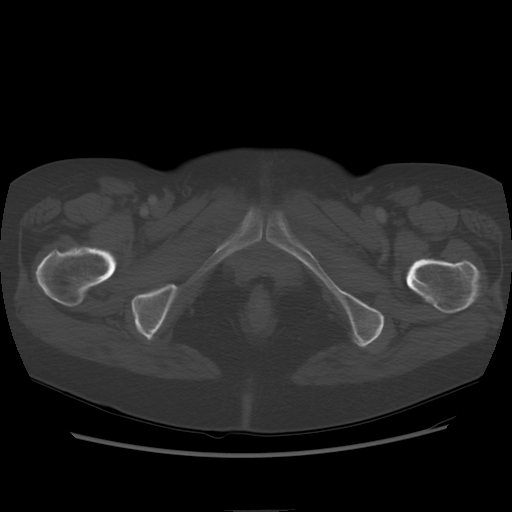
[im 14/88  soft-tissue]
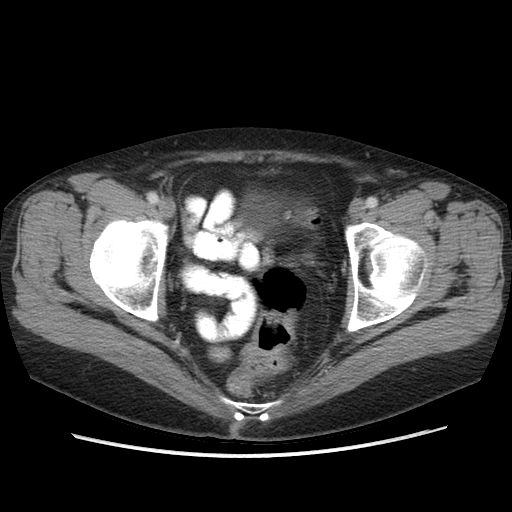
[im 18/88  soft-tissue]
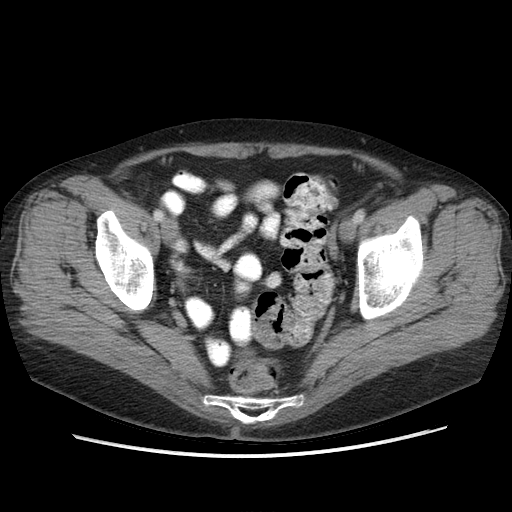
[im 22/88  soft-tissue]
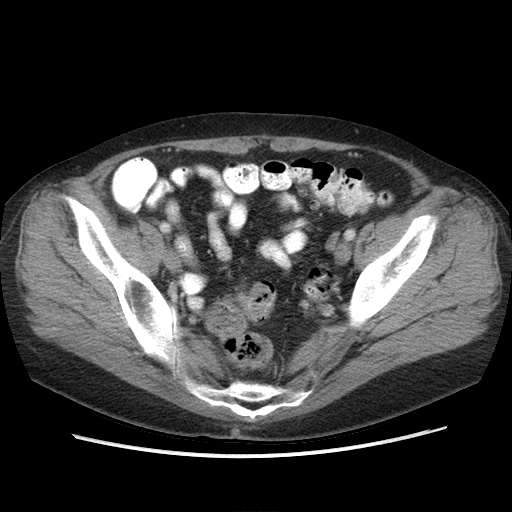
[im 31/88  soft-tissue]
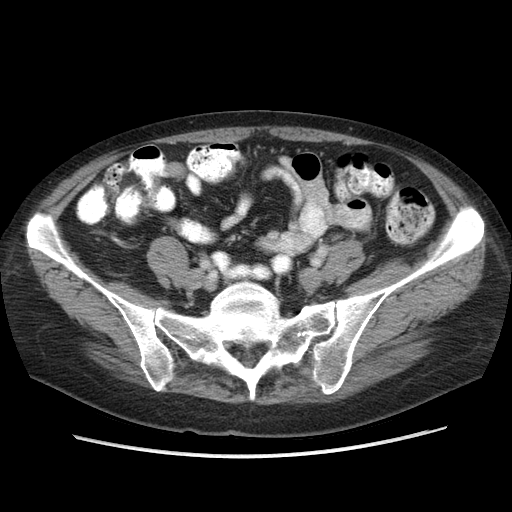
[im 35/88  soft-tissue]
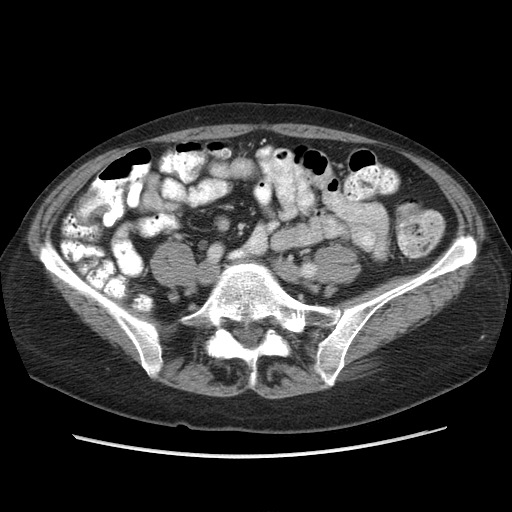
[im 40/88  soft-tissue]
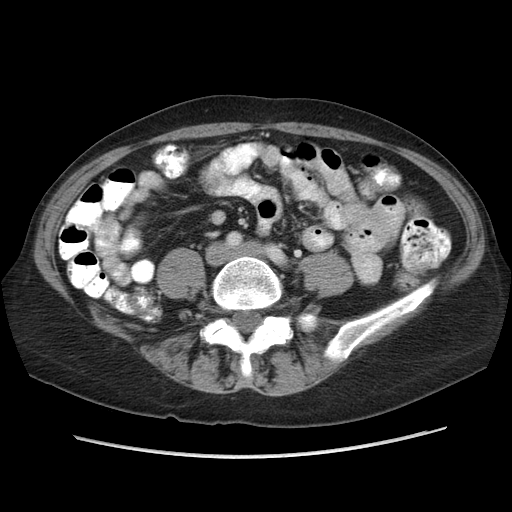
[im 48/88  soft-tissue]
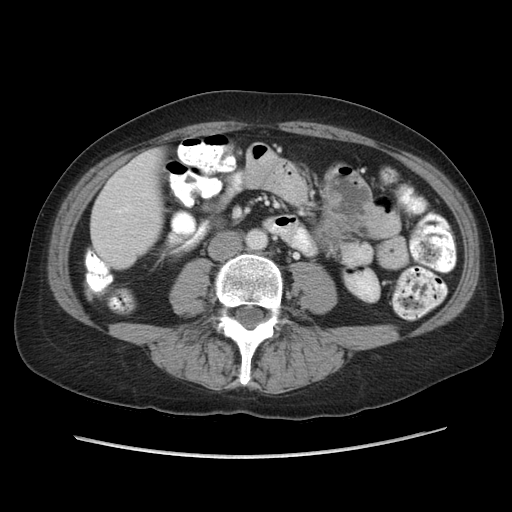
[im 53/88  soft-tissue]
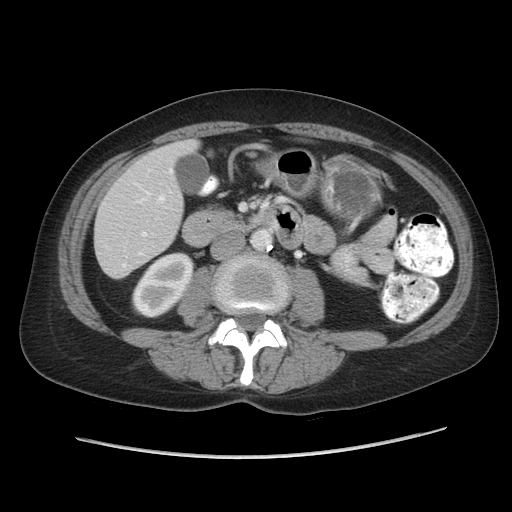
[im 53/88  bone]
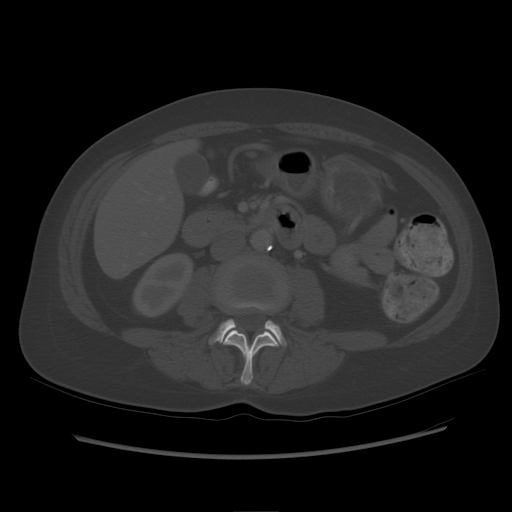
[im 57/88  soft-tissue]
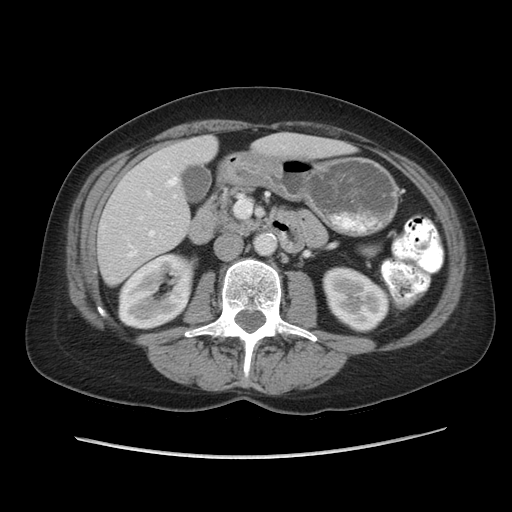
[im 66/88  soft-tissue]
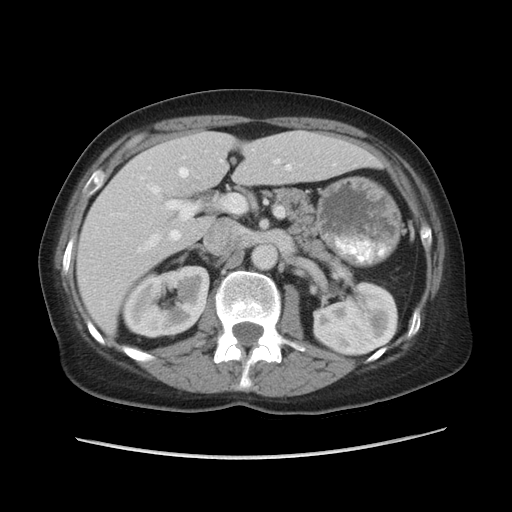
[im 70/88  soft-tissue]
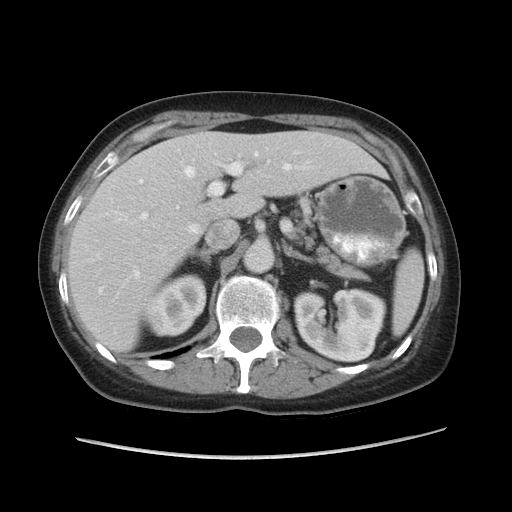
[im 74/88  soft-tissue]
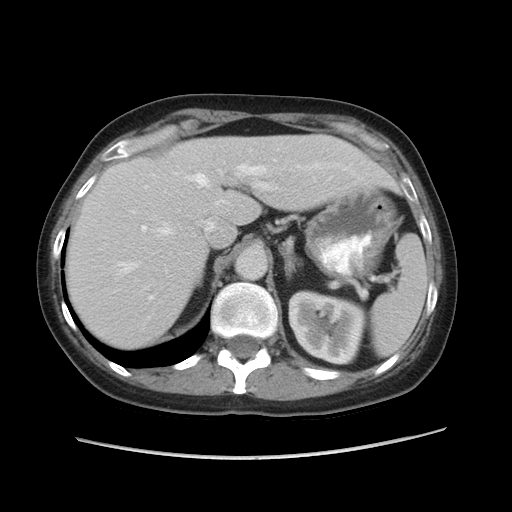
[im 83/88  soft-tissue]
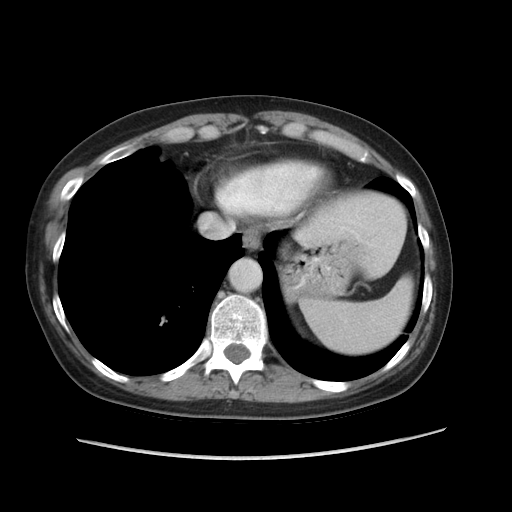

[Series 602: sagittal body · sagittal · 0.89mm/px · 3 of 145 slices shown]
[im 49/145  soft-tissue]
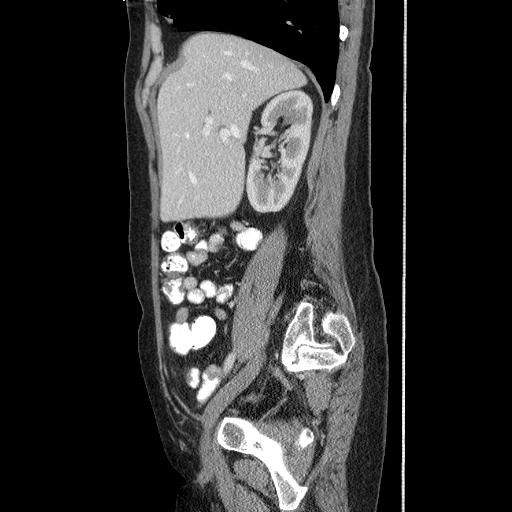
[im 65/145  soft-tissue]
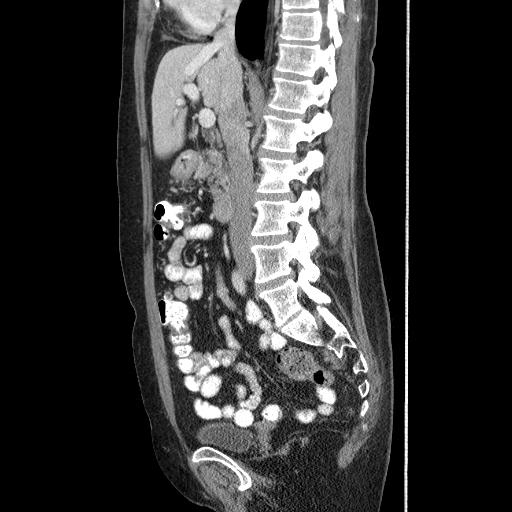
[im 81/145  soft-tissue]
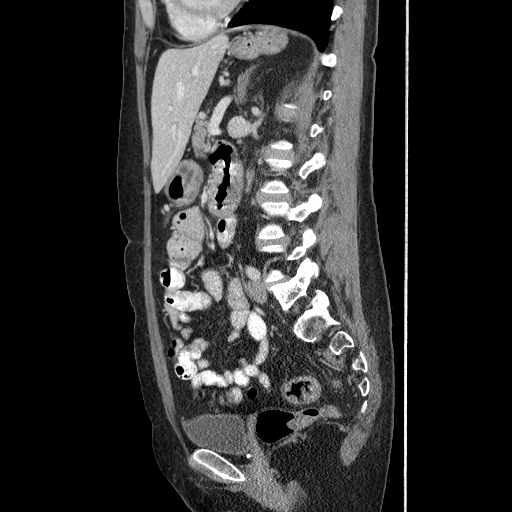

[17 of 46 positions shown; findings below may reference images not displayed]

FINDINGS: The scan demonstrates a 5 mm lesion in the inferior aspect of the right lobe of the liver laterally consistent with a tiny hemangioma and, with benefit of retrospection, it is unchanged since 01/15/07.  The remainder of the liver is normal.  The spleen, pancreas, adrenal glands, and kidneys appear normal.  No dilated loops of large or small bowel.  No diverticular disease or adenopathy.  No significant bony abnormality.  
 There is a small patchy area of atelectasis or infiltrate at the left base posterolaterally as well as in the lateral aspect of the right middle lobe and anterolaterally at the right base. These are new since the prior study.
IMPRESSION: Benign appearing abdomen.  Tiny patchy areas of atelectasis or inflammation in the lung bases. 
 PELVIS CT WITH CONTRAST:
FINDINGS: Uterus and right ovary appear to have been removed. The left ovary is normal.  There are a few diverticula in the mid to proximal sigmoid portion of the colon with no evidence of diverticulitis.  No free fluid or adenopathy or significant bony abnormality.
IMPRESSION: Mild sigmoid diverticulosis.

## 2009-01-24 IMAGING — RF DG UGI W/ KUB
19 of 24 series · 19 of 24 positions shown · non-contrast
Comparison: None available.

CLINICAL DATA: Left abdominal pain/history of ulcers.  
 KUB UGI:
TECHNIQUE: Routine double contrast upper GI.

[Series 1: run · 1 of 1 slices shown (1 of 19)]
[im 1/1]
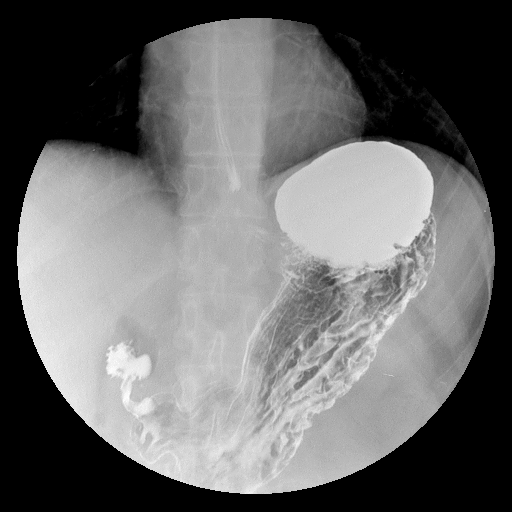

[Series 2: run · 1 of 1 slices shown (2 of 19)]
[im 1/1]
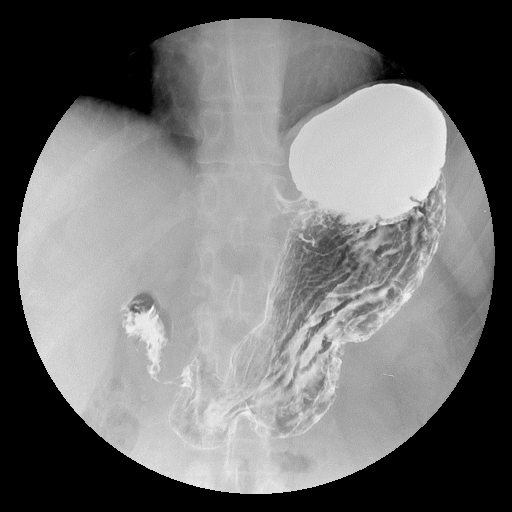

[Series 4: run · 1 of 1 slices shown (3 of 19)]
[im 1/1]
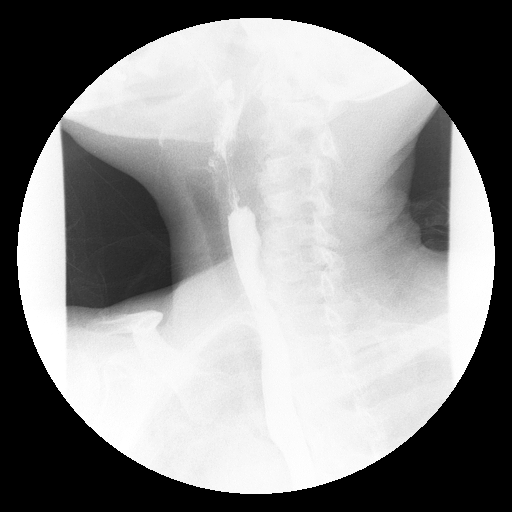

[Series 5: run · 1 of 1 slices shown (4 of 19)]
[im 1/1]
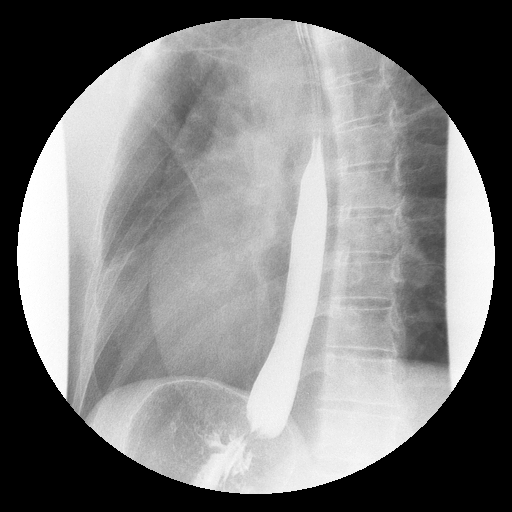

[Series 6: run · 1 of 1 slices shown (5 of 19)]
[im 1/1]
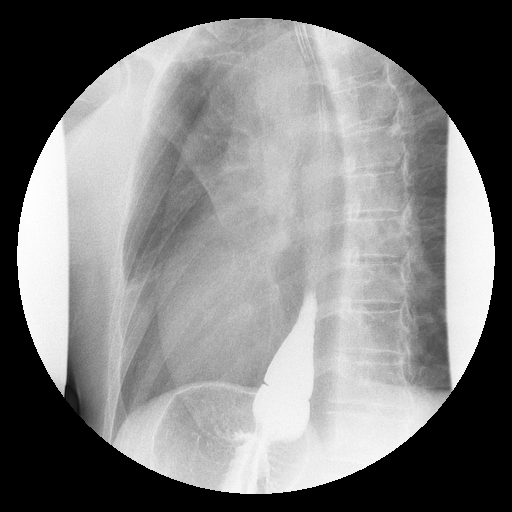

[Series 7: run · 1 of 1 slices shown (6 of 19)]
[im 1/1]
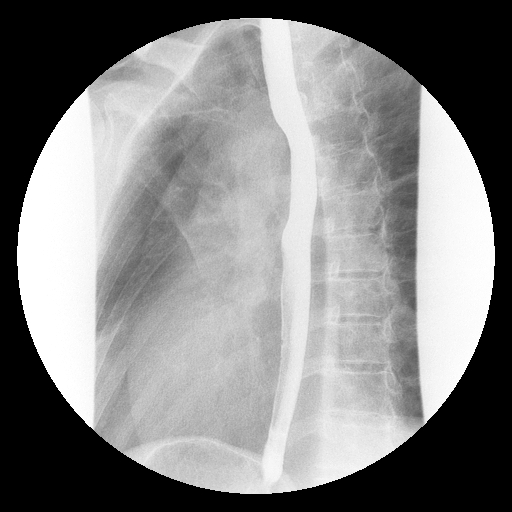

[Series 9: run · 1 of 1 slices shown (7 of 19)]
[im 1/1]
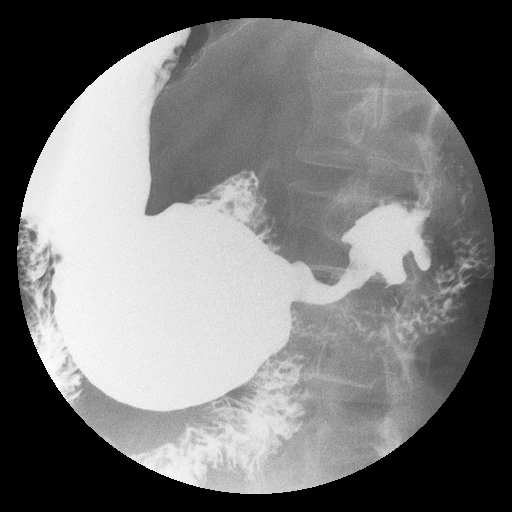

[Series 10: run · 1 of 1 slices shown (8 of 19)]
[im 1/1]
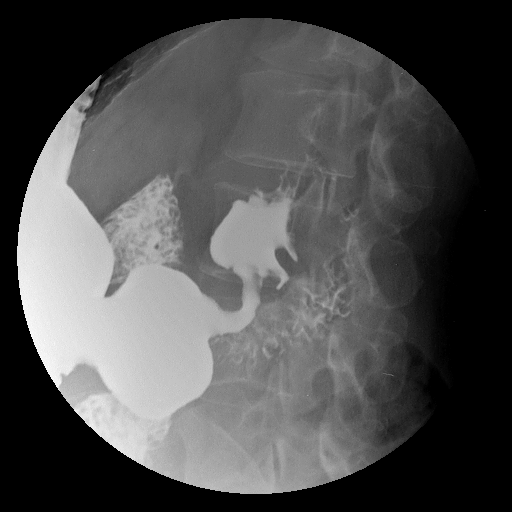

[Series 11: run · 1 of 1 slices shown (9 of 19)]
[im 1/1]
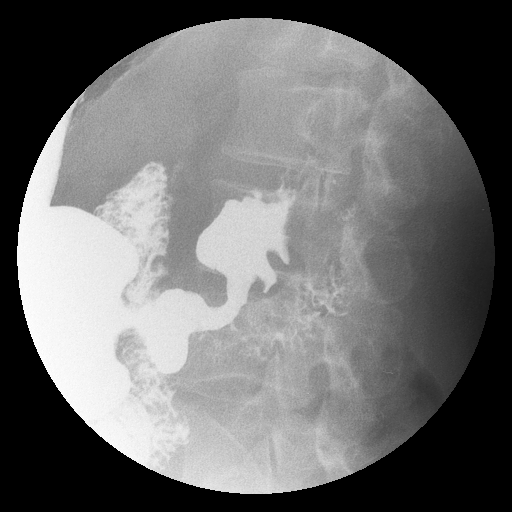

[Series 13: run · 1 of 1 slices shown (10 of 19)]
[im 1/1]
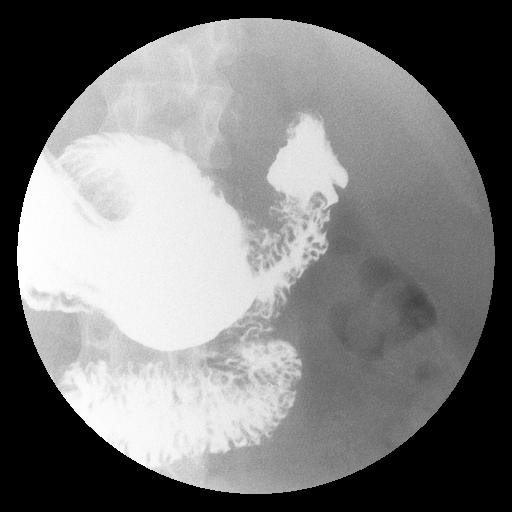

[Series 14: run · 1 of 1 slices shown (11 of 19)]
[im 1/1]
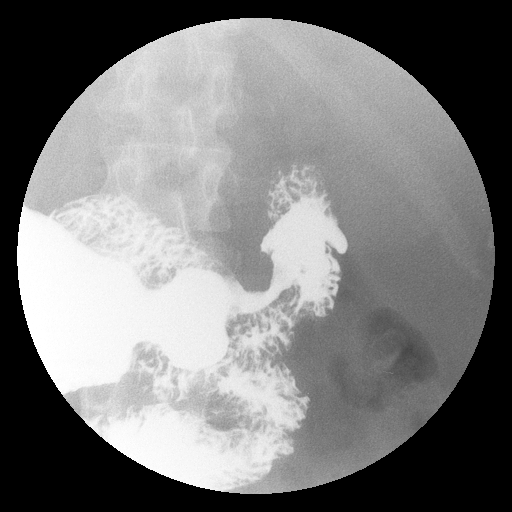

[Series 15: run · 1 of 1 slices shown (12 of 19)]
[im 1/1]
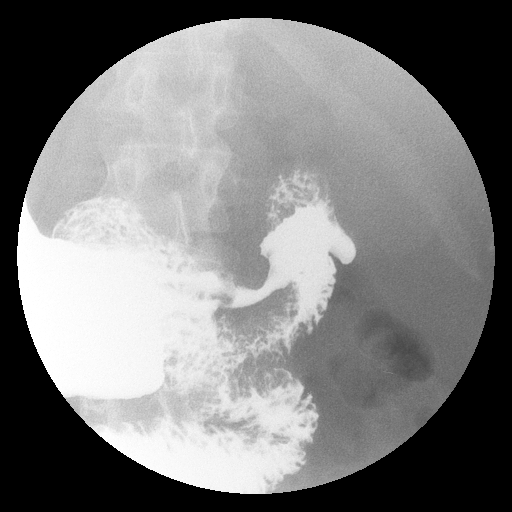

[Series 16: run · 1 of 1 slices shown (13 of 19)]
[im 1/1]
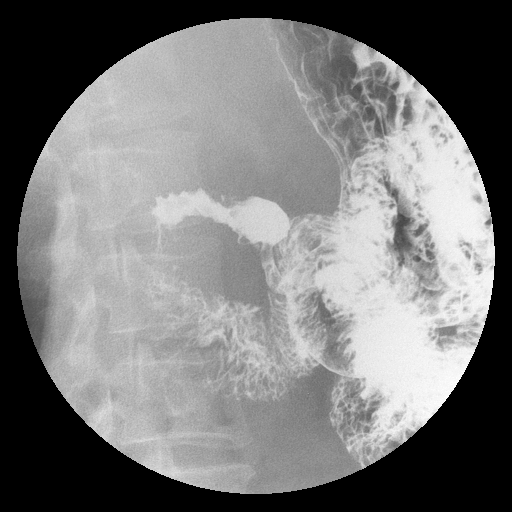

[Series 18: run · 1 of 1 slices shown (14 of 19)]
[im 1/1]
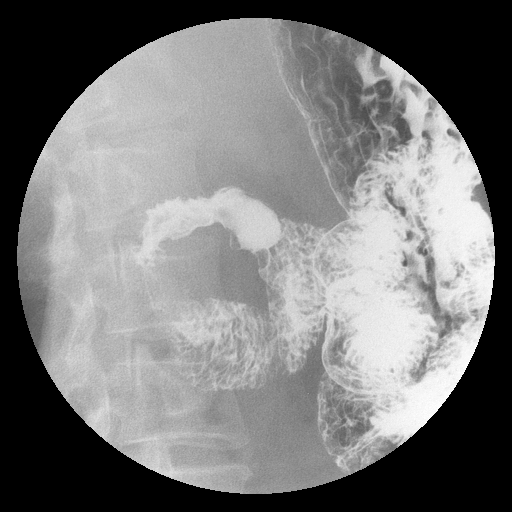

[Series 19: run · 1 of 1 slices shown (15 of 19)]
[im 1/1]
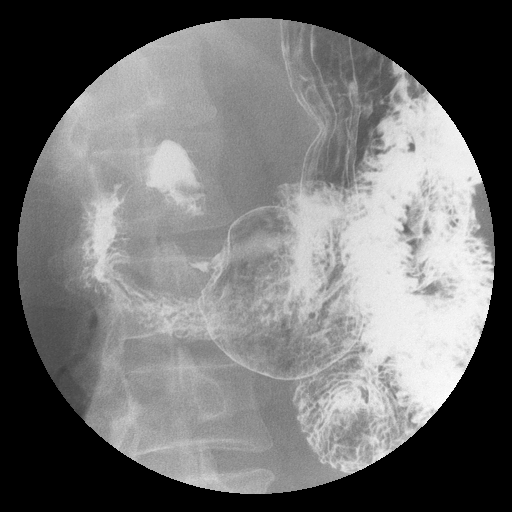

[Series 20: run · 1 of 1 slices shown (16 of 19)]
[im 1/1]
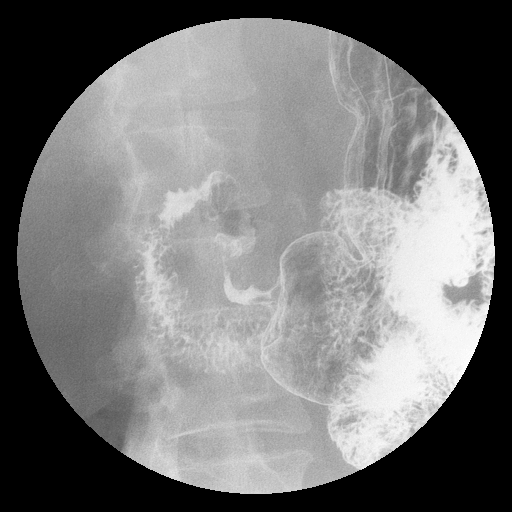

[Series 21: run · 1 of 1 slices shown (17 of 19)]
[im 1/1]
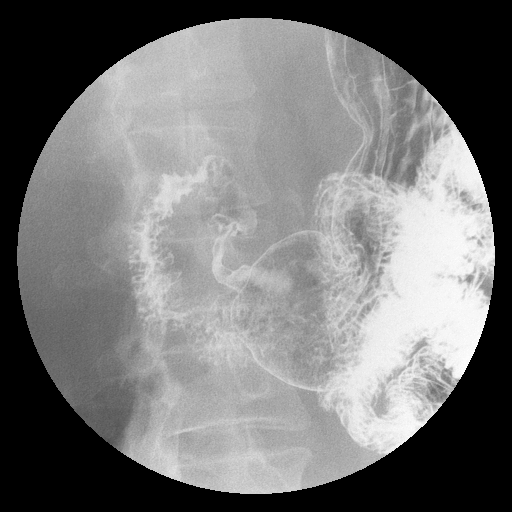

[Series 23: run · 1 of 1 slices shown (18 of 19)]
[im 1/1]
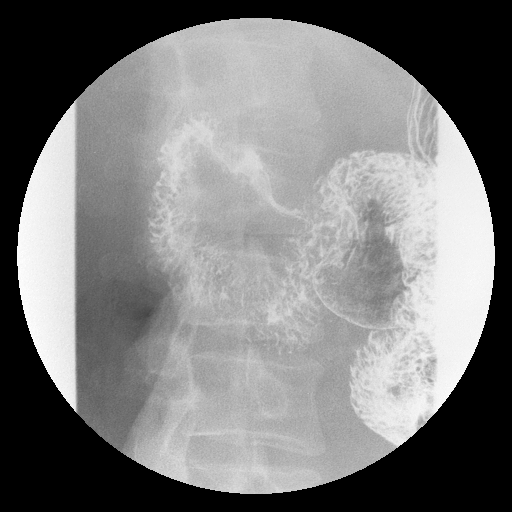

[Series 24: run · 1 of 1 slices shown (19 of 19)]
[im 1/1]
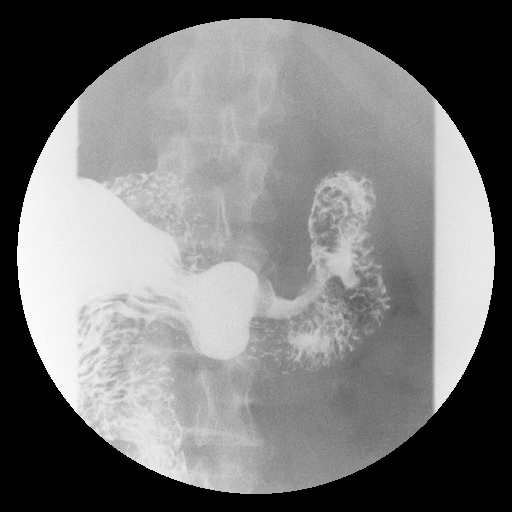

[19 of 24 positions shown; findings below may reference images not displayed]

FINDINGS: Scout KUB unremarkable.
 Swallowing mechanism normal.  No penetration or aspiration.  There is a small sliding hiatal hernia with the Valsalva maneuver demonstrated by a Schatzki ring.  There is a trace of gastroesophageal reflux noted. 
 The proximal and mid stomach are normal.  However, there is fixed narrowing of the distal antrum of the stomach over a relatively short segment that blends in with the pylorus.  There is also deformity of the duodenal bulb, several diverticular-like projections coming off of the greater curvature side.  
 I do not see any definite active ulceration.  Deformities of the distal antrum, pylorus, and duodenal bulb are most likely related to prior peptic disease with scarring.  We have no prior GI studies on this patient.  I do not known whether or not she has been endoscoped.  These findings need clinical correlation with consideration for endoscopy, if endoscopy has not been done recently.  The concern is that the fixed narrowing of the distal antrum and pylorus could be due to infiltrative disease such as lymphoma.  I would be happy to discuss this with you in light of any other pertinent clinical data.  
 C-loop, remainder of duodenum, and proximal small bowel that visualized are normal.
IMPRESSION: 1.  Small sliding hiatal hernia with a trace of reflux. 
 2.  Fixed narrowing of the distal antrum/pylorus, plus deformity of the duodenal bulb.  Findings most consistent with scarring from peptic ulcer disease, but there are no prior studies for correlation.  See above discussion.

## 2010-10-31 ENCOUNTER — Emergency Department (HOSPITAL_COMMUNITY)
Admission: EM | Admit: 2010-10-31 | Discharge: 2010-10-31 | Payer: Self-pay | Source: Home / Self Care | Admitting: Emergency Medicine

## 2010-12-20 ENCOUNTER — Emergency Department (HOSPITAL_COMMUNITY)
Admission: EM | Admit: 2010-12-20 | Discharge: 2010-12-20 | Payer: Self-pay | Source: Home / Self Care | Admitting: Emergency Medicine

## 2010-12-27 ENCOUNTER — Encounter: Payer: Self-pay | Admitting: Internal Medicine

## 2011-01-04 ENCOUNTER — Encounter
Admission: RE | Admit: 2011-01-04 | Discharge: 2011-01-04 | Payer: Self-pay | Source: Home / Self Care | Attending: Specialist | Admitting: Specialist

## 2011-01-07 ENCOUNTER — Encounter: Payer: Self-pay | Admitting: Internal Medicine

## 2011-01-07 ENCOUNTER — Encounter: Payer: Self-pay | Admitting: Family Medicine

## 2011-01-18 NOTE — Letter (Signed)
Summary: Colonoscopy Date Change Letter  [facility]  [address] [place], [place] [address]   Phone: [phone]  Fax: [phone]      December 27, 2010 MRN: [id number]   AVYNN KLASSEN [address] [place], [place]  [address]   Dear Ms. CALOCA,   Previously you were recommended to have a repeat colonoscopy around this time. Your chart was recently reviewed by Dr. Lina Sar of [facility]. Follow up colonoscopy is now recommended in January 2019. This revised recommendation is based on current, nationally recognized guidelines for colorectal cancer screening and polyp surveillance. These guidelines are endorsed by the American Cancer Society, [employer] on Colorectal Cancer as well as numerous other major medical organizations.  Please understand that our recommendation assumes that you do not have any new symptoms such as bleeding, a change in bowel habits, anemia, or significant abdominal discomfort. If you do have any concerning GI symptoms or want to discuss the guideline recommendations, please call to arrange an office visit at your earliest convenience. Otherwise we will keep you in our reminder system and contact you 1-2 months prior to the date listed above to schedule your next colonoscopy.  Thank you,  Hedwig Morton. Juanda Chance, M.D.  [facility] [phone]

## 2011-05-01 NOTE — Discharge Summary (Signed)
NAMEJAEDYN, MARRUFO                 ACCOUNT NO.:  [id number]   MEDICAL RECORD NO.:  [id number]          PATIENT TYPE:  INP   LOCATION:  6715                         FACILITY:  [facility]   PHYSICIAN:  Zenaida Deed. Mayford Knife, M.D.DATE OF BIRTH:  May 14, 1951   DATE OF ADMISSION:  11/18/2007  DATE OF DISCHARGE:  11/19/2007                               DISCHARGE SUMMARY   REASON FOR ADMISSION:  Dehydration.   COURSE OF TREATMENT RENDERED:  Ms. Munley is a 60 year old female who  presented to an Urgent Care Center after several days of intractable  nausea and vomiting.  She was found to be somewhat dehydrated at that  visit and was felt to benefit from IV hydration.  She was admitted and  treated with bolus of IV fluid and they continue at maintenance IV fluid  until she was able to tolerate a clear diet without vomiting.  In  addition, she was started on Zofran 4 mg every 6 hours as needed and  with this regimen, she was able to again tolerate clear fluids.   PERTINENT LABORATORY FINDINGS:  Chemistry was within normal limits with  a sodium of 145, potassium 3.7, chloride 109, bicarb 30, BUN 24,  creatinine 0.72, glucose 103, and calcium was 8.8.  LFTs were within  normal limits.  CBC revealed white blood cell count 8.0, hemoglobin  11.3, hematocrit 34.1, and platelets 333,000 with a normal differential.  Fecal occult blood screen was negative.   COMPLICATIONS AND INFECTIONS:  None.   FINAL DIAGNOSIS:  Viral gastroenteritis with subsequent dehydration.   CONDITION ON DISCHARGE:  Improved.   MEDICATIONS:  1. Omeprazole 20 mg p.o. twice daily for 14 days.  2. Phenergan 25 mg one tablet p.o. q.4-6h. p.r.n. nausea.  3. Tylenol 500 mg p.o. q. 4-6h. p.r.n. pain.   INSTRUCTIONS:  She may return to work on Monday, November 24, 2007.  She  is to follow a low sodium heart healthy diet and may advance her diet as  tolerated.  She has no restrictions with regards to her activity.  She  has been asked to  avoid NSAIDs given her gastroenteritis.   FOLLOWUP:  Followup appointment can be with Dr. Patsy Lager at [facility].  She has been asked to walk in to see her on Monday  between 10 a.m. and 7 p.m. when Dr. Patsy Lager is available.      Ancil Boozer, MD  Electronically Signed      Zenaida Deed. Mayford Knife, M.D.  Electronically Signed    SA/MEDQ  D:  11/19/2007  T:  11/19/2007  Job:  [id number]

## 2011-05-01 NOTE — Assessment & Plan Note (Signed)
[facility]                         GASTROENTEROLOGY OFFICE NOTE   SUAD, AUTREY                        MRN:          [id number]  DATE:02/11/2008                            DOB:          09/16/1951    Ms. Wamboldt is a 60 year old white female with chronic peptic ulcer  disease, partial duodenal outlet obstruction, demonstrated on recent  upper endoscopy on January 14, 2008.  This was confirmed on upper GI  series, which showed a thickened, narrow antrum, pseudodiverticula of  the duodenum.  The radiologist could not rule out possibility of  lymphoma, but on endoscopy, this was clearly a benign chronic peptic  ulcer disease and I do not think that further evaluation of this is  necessary.  Her gastric emptying scan was normal.  Patient has been on  omeprazole 20 mg once a day, but feels that she needs it twice a day  because of the recurrent epigastric discomfort.   Another problem has been left lower quadrant abdominal pain.  Ms. Schwall  was treated with antibiotic courses, at least two of them, without any  relief of the discomfort, on a colonoscopy on August 14, 2007, there was  only mild diverticular disease of the left colon, without any signs of  diverticulitis or colitis.  It has been my feeling that we are dealing  with an irritable bowel syndrome.  Endometriosis was ruled out in the  past.  She is postmenopausal.   Ms. Martello has been under a great deal of stress.  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   PHYSICAL EXAM:  Blood pressure 100/60, pulse 72 and weight 158 pounds.  Patient smelled of tobacco.  LUNGS:  Clear to auscultation.  COR:  Normal S1, normal S2.  ABDOMEN:  Soft, but diffusely tender, more so in left lower quadrant and  in epigastrium.  There was no distention and bowel sounds were normal.  RECTAL EXAM:  Not  repeated.   IMPRESSION:  1. Patient is a 60 year old white female with chronic peptic ulcer      disease of the gastric antrum and duodenum with partial duodenal      outlet obstruction, but normal gastric emptying scan.  No active      ulcers were noted on recent upper endoscopy, although she does have      a personal history of active gastric ulcer in 2002.  2. Chronic left lower quadrant abdominal pain, likely functional.  She      has mild diverticulosis of the left colon, which is not causing her      pain.  3. Smoker.  4. Stress reaction.   PLAN:  1. Increase Prilosec 20 mg p.o. b.i.d.  2. Renew Tramadol 50 mg with one refill.  3. Add Ativan 0.5 mg dispense #60 with one refill to take either at      bedtime or during the day p.r.n. stress.  4. Continue dicyclomine 10 mg one  p.o. t.i.d. a.c.  It seems to be      relieving some of her abdominal discomfort.   Patient will let us know if she moves to [facility], in which case we  will forward her records to a gastroenterologist in [place].     Hedwig Morton. Juanda Chance, MD  Electronically Signed    DMB/MedQ  DD: 02/11/2008  DT: 02/11/2008  Job #: [id number]   cc:   Donia Guiles, M.D.

## 2011-05-01 NOTE — Assessment & Plan Note (Signed)
[facility]                         GASTROENTEROLOGY OFFICE NOTE   Morgan [name], Morgan [name]                        MRN:          [id number]  DATE:01/09/2008                            DOB:          May 24, 1951    Morgan [name] is a very nice 60 year old white female who is here today for  evaluation of a left lower quadrant abdominal pain.  Her acute illness  started the first week in December 2008, with severe nausea, vomiting,  and dehydration which ended up in a hospitalization between November 18, 2007 to November 19, 2007, with a diagnosis of viral gastroenteritis.  Since then she has developed persistent left lower quadrant abdominal  pain.  A CT scan of the abdomen on December 12, 2007, showed a mild  diverticulosis of the left colon but no other abnormality.  We have seen  Morgan [name] in the past for a screening colonoscopy in January 2002 and for  evaluation of iron-deficiency anemia which showed mild diverticulosis of  the left colon.  She had a gastric ulcer on an upper endoscopy in  January 2002, which was most likely responsible for iron-deficiency  anemia.  She remains anemic with last hemoglobin 11 grams.  She has been  on an iron supplement on a daily basis.  She also is a smoker and she  has a history of cardiac catheterization in December 2001, with no  significant disease.   MEDICATIONS:  1. HCTZ 12.5 mg p.o. daily.  2. Tramadol 1 p.o. t.i.d.  3. Omeprazole 20 mg p.o. daily.  4. Xanax 0.5 mg p.r.n.   FAMILY HISTORY:  Positive history of colon in her grandfather and  ulcerative colitis in her mother.  Positive for uterine cancer in the  mother.  Ovarian cancer in an aunt.  Breast cancer in grandmother and  aunt.  Heart disease in mother and grandmother.   PAST MEDICAL HISTORY:  1. High blood pressure.  2. Heart rhythm problems.  3. Depression.  4. Chronic headaches.   SOCIAL HISTORY:  Single with 2 children.  She works in collections.   She  smokes about a pack of cigarettes every 3 days.  She does not drink  alcohol.   REVIEW OF SYSTEMS:  Positive for occasional fever, allergies, frequent  cough, sleep problems, night sweats, heart arrhythmia problems, cough,  back pain, new anxiety, severe fatigue, and new depression.   PHYSICAL EXAMINATION:  VITAL SIGNS:  Blood pressure 112/58, pulse 88,  and weight 164 pounds.  GENERAL:  The patient was alert, oriented, in no distress.  HEENT:  She has poor dentition.  NECK:  Supple.  No adenopathy.  Thyroid not enlarged.  LUNGS:  Clear to auscultation.  No rales or wheezes.  COR:  Rapid S1, S2.  No murmur.  ABDOMEN:  Soft.  Flat.  Relaxed but very tender on even very light touch  mostly in the left lower, middle, and upper quadrants and also occurs in  the epigastrium.  There was a well healed vertical scar below the  umbilical.  Right lower quadrant and right upper quadrant were  unremarkable.  There was no CVA tenderness.  Liver edge was at costal  margin.  RECTAL:  Shows normal rectal tone.  There was no stool in the rectal  ampulla.  Mucus was heme negative.  EXTREMITIES:  No edema.   IMPRESSION:  55. A 60 year old white female with acute illness beginning in the      beginning December 2008, which has persisted and now with localized      pain to the left lower quadrant anteriorly suggesting either      colitis, diverticulitis, or some inflammatory condition of the left      colon.  Her white cell count has been slightly elevated despite      being on several courses of antibiotics, most recent one Septra.      Her last colonoscopy 7 years ago showed diverticulosis of the left      colon but the CT scan has not confirmed any inflammation.  There is      also a possibility of ischemic colitis since she is a smoker and      she is on antihypertensive medications.  2. History of gastric ulcer and iron-deficiency anemia which persists.      The patient also on tramadol  which could cause gastritis and      gastropathy.  Although the pain is mostly localized to the lower      and middle quadrants, she also has tenderness in the epigastrium      and one would wonder if we may not be dealing with recurrent ulcer.   PLAN:  1. Upper and lower endoscopies scheduled.  2. Continue Prilosec 20 mg a day.  3. Try antispasmodic Bentyl 10 mg p.o. b.i.d.  4. The patient will discontinue her iron supplement several days prior      to the colonoscopy which will be done next week with routine      colonoscopy prep.     Hedwig Morton. Juanda Chance, MD  Electronically Signed    DMB/MedQ  DD: 01/09/2008  DT: 01/09/2008  Job #: [id number]   cc:   Pearline Cables, MD

## 2011-05-04 NOTE — Consult Note (Signed)
[place]. [facility]  Patient:    Morgan [name], Morgan [name]                        MRN: [id number] Proc. Date: 09/17/00 Adm. Date:  [id number] Attending:  Anastasio Auerbach                          Consultation Report  DATE OF BIRTH:  Jul 06, 1951.  REFERRING PHYSICIAN:  Anastasio Auerbach, M.D.  REASON FOR EVALUATION:  Syncope, headache, and right-sided weakness.  HISTORY OF PRESENT ILLNESS:  This is the initial ER consultation visit for this 60 year old white female with a past medical history of anxiety, depression, and hypertension, who comes to the ER today with new onset of spells starting this morning.  The patient reports that she has had episodes of seeing "floaters."  This was followed quickly by a sense of lightheadedness, and then she loses consciousness.  By report, she is unconscious for a few minutes without evident motor activity and gradually comes around.  She says that she feels confused and frightened and nervous when she wakes up and "startled," but not particularly confused or drowsy. She came to the [facility] Emergency Room, where she was seen to have about 12 of these.  During the episodes, she was described as "unresponsive" and apparently had no response to an ammonia capsule.  No associated motor activity was noted.  She is now awake and alert.  She admits to increased stress in her life recently.  She also reports that she started a medicine indicated for weight loss for days ago, which she did not realize contained ephedrine.  She has no history of seizure, stroke, or brain injury.  PAST MEDICAL HISTORY:  Remarkable for hypertension, fairly well-controlled, and anxiety and depression as above.  She has never seen a psychiatrist.  FAMILY HISTORY:  Her son has epilepsy.  SOCIAL HISTORY:  She admits to increased stress as above.  She apparently had a big fight with her son last night.  MEDICATIONS:  Hydrochlorothiazide, Paxil, ephedrine-containing  weight loss pill for the past four days.  REVIEW OF SYSTEMS:  Remarkable for a very severe right frontal headache at the moment, also tingling and numbness in the extremities.  No chest pain or shortness of breath.  Otherwise negative.  PHYSICAL EXAMINATION:  VITAL SIGNS:  Temperature 97.1, blood pressure 152/83, pulse 96, respirations 20.  GENERAL:  She is alert.  She appears distressed secondary to headache.  HEENT:  Head:  Cranium is normocephalic and atraumatic.  Oropharynx is benign.  NECK:  Supple without bruits.  CHEST:  Clear to auscultation.  HEART:  Regular rate and rhythm without murmurs.  NEUROLOGIC:  Mental status:  She is alert and fully oriented.  Speech is fluent and without dysarthria or aphasia.  She appears distressed and also rather anxious.  Cranial nerves:  Funduscopic exam is benign.  Pupils are equal and briskly reactive.  Extraocular movements are normal.  Visual field testing reveals an inconstant right homonymous hemianopsia, although she does blink to threat on the right.  The face was symmetrical.  The tongue and palate move well and in the midline.  Motor exam:  Normal bulk and tone throughout.  Strength on the left is normal.  Right is very difficult to test secondary to diffuse give-away.  However, she is able to at least briefly maintain the extremities against gravity.  Sensation:  She claims no sensation to light touch in the right hand and foot and diminished on the right upper and lower extremities, but she does withdraw the extremities to pain.  The left is normal.  Cerebellar:  Rapid alternating movements are slow on the right.  Cerebellar is intact at first, but then a dysmetria which is apparently functional appears on the right with repeated testing.  Deep tendon reflexes are 2+ at the upper extremities and knees and 1+ in the ankles. Toes are downgoing.  Gait examination is deferred.  LABORATORY DATA:  CBC is remarkable for a mild  normocytic anemia.  CMET is unremarkable except for mildly decreased potassium.  Urine drug screen is positive for amphetamines.  EKG is normal, and telemetry reveals no events. The patient underwent EEG, during which she had an episode of unresponsiveness, which was reportedly typical of these events.  The tracing was entirely normal including during the episode.  A CT of the head was normal, although apparently this was done before she complained of right-sided weakness.  IMPRESSION: 1. Unresponsiveness and right-sided weakness with evidence of functional    and/or psychogenic disease on exam.  I suspect a conversion reaction. 2. Hypertension. 3. Anxiety and depression.  RECOMMENDATIONS: 1. Will check MRI as the weakness is of new onset since the CT.  This will    almost certainly be normal. 2. Psychiatric evaluation.  The patient should be prepared for this, but it    seems like she will probably consent. 3. Depending on the speed with which she recovers, she may need physical and    occupational therapy. DD:  09/17/00 TD:  09/18/00 Job: [id number]

## 2011-05-04 NOTE — Discharge Summary (Signed)
[place]. [facility]  Patient:    Morgan [name], Morgan [name]                        MRN: [id number] Adm. Date:  [id number] Disc. Date: [id number] Attending:  Anastasio Auerbach CC:         Desma Maxim, M.D.  Kelli Hope, M.D.  Doylene Canning. Ladona Ridgel, M.D. [facility]   Discharge Summary  DATE OF BIRTH:  1951/12/15  DISCHARGE DIAGNOSES:  1. Neurocardiogenic syncope.  2. Severe migraine headache.  3. Urine drug screen positive for ephedrine.     a. Recent use of diet pills.  4. Escherichia coli urinary tract infection.  5. Urinary retention.     a. Secondary to urinary tract infection.     b. Secondary to narcotic administration.  6. Depression.  7. Anxiety.  8. Iron-deficiency anemia.     a. Outpatient workup.  9. Status post hysterectomy two years ago. 10. Hypertension. 11. History of tobacco use.     a. Quit 10 months ago. 12. Left-sided paresthesias, improving. 13. History of right lower extremity sciatic. 14. Hypokalemia, on hydrochlorothiazide.     a. Replaced orally.  DISCHARGE MEDICATIONS: 1. (New) Septra DS one p.o. q.12h. through September 26, 2000, (total 7 days therapy). 2. Migranol nasal spray 1 spray (0.5 mg) into each nostril at the first sign  of a headache, repeat as needed within 15 minutes up to a total of 6 sprays  in any 24 hour period, and no more than 8 sprays per week. 3. (Increased dose) Paxil 20 mg take two pills daily. 4. Do not take hydrochlorothiazide until followup with Dr. Arvilla Market. 5. Do not take anything over-the-counter without consulting Dr. Arvilla Market first.  CONDITION ON DISCHARGE:  Stable.  Headache-free.  Blood pressure 110 to 120 over 60 to 70.  RECOMMENDED ACTIVITY:  No driving for 3 months.  If no further syncopal episodes, can resume as assessed by Dr. Arvilla Market.  May return to work on Monday, September 30, 2000.  RECOMMENDED DIET:  Low salt.  SPECIAL INSTRUCTIONS: 1. Continue to stay away from cigarettes. 2. No  over-the-counter medications without consulting a physician. 3. Call Dr. Arvilla Market if problems or questions arise.  FOLLOWUP:  Dr. Arvilla Market on Thursday, October 03, 2000, at 10:30.  At that visit she should have her blood pressure checked, her neurologic symptoms re-evaluated, guaiac cards given, iron started, and urinalysis repeated.  CONSULTATIONS: 1. Kelli Hope, M.D. 2. Lewayne Bunting, M.D.  PROCEDURES: 1. EEG (September 17, 2000):  Normal.  No evidence of seizure activity.  Of note,    the patient was having syncopal episodes during this study. 2. EKG:  Normal sinus rhythm.  Nonspecific T-wave abnormalities, to include small inverted T-waves in the anterior leads. 3. A 2-D echocardiogram (September 20, 2000):  Overall left ventricular systolic   function normal.  Ejection fraction 55 to 65%.  LV wall thickness normal.  Aortic valve thickness mildly increased. 4. Head CT without contrast (September 17, 2000):  Normal. 5. MRI brain with and without contrast (September 18, 2000):  Minimal nonspecific    white matter type changes in the right frontal lobe.  Otherwise negative.   Interpretation by Dr. Thad Ranger feels these areas are nonsignificant. 6. Lumbar puncture (September 19, 2000).  HOSPITAL COURSE:  #1 - NEUROCARDIOGENIC SYNCOPE:  Morgan [name] is a 60 year old Caucasian female with a history of depression, anxiety, and hypertension.  She presents to the emergency room complaining  of seeing "floaters" and becoming light-headed and passing out.  She had at least 12 or 15 episodes right in the emergency department and was somewhat difficult to arise, although came to quite quickly.  EEG was done in the emergency room and showed no evidence of seizure activity.  Telemetry at the time demonstrated no abnormalities.  She subsequently developed a severe frontal headache which was ultimately diagnosed as a migraine and treated with DHE.  On September 19, 2000, she had a particularly significant spell  and a code was called.  I had discontinued telemetry by that time because she had no abnormalities.  I saw her almost immediately, and it took about 15 seconds for her to arouse.  She did have a pulse at the time I saw her, and on the monitor less than 1 minute after the code was called she had a normal blood pressure, heart rate, and oxygen saturation.  I feel that this represented her neurocardiogenic syncope, and it did occur while she was laying down.  I do not feel that she was either pulseless or apneic.  A 2-D echo was done which was essentially normal.  Given the nature of her symptoms, it was felt that she should not drive for at least three months.  She will continue on her Paxil which is one of the treatments for neurocardiogenic syncope, and I suspect that possibly an early migraine headache, as well as recent use of ephedrine containing diet pills have precipitated these spells.  She had been on the diet pills for approximately 3 to 4 days.  #2 - SEVERE MIGRAINE HEADACHES:  Morgan [name] has had headaches in the past, but nothing this severe.  Her spells seem to be precipitated by floaters and then lightheadedness.  I suspect the contribution of ephedrine may have precipitated this as well.  It was relieved with DHE.  She will go home with Migranol nasal spray to use as needed.  Because her headaches became extremely severe, a lumbar puncture was performed on September 19, 2000, which demonstrated no evidence of infection, no red blood cells, and normal pressure.  #3 - URINARY TRACT INFECTION:  Morgan [name] had pyuria with 11 to 20 WBCs in her urine on admission.  Culture came back E. coli, and she was placed on Septra. She subsequently developed urinary retention, which I believe may have been secondary to the infection as well as the narcotic use.  On Septra and off  morphine and all narcotics she had no difficulty with urinary retention. Should this recur or should she have  persistent infection, would recommend a neurology referral.  #4 - DEPRESSION/ANXIETY:  Morgan [name] had a significant argument with her 80 year old son on the evening before presentation.  This may have also contributed to the syncope.  Stress and anxiety in addition to ephedrine and headache likely causes.  We did increase her Paxil to 40 mg per day from 20 mg daily.  At this time she is calm, and in no way severely depressed or anxious. Work is a source of stress as well, and she will return in one week.  This will allow her time to recover from the recent hospital stay, as well as to come up with transportation to and from work.  She will talk with Dr. Arvilla Market about the potential need for a psychiatrist or psychologist referral.  #5 - IRON-DEFICIENCY ANEMIA:  Ms. Pope apparently has a history of iron-deficiency.  She had a hysterectomy about two years ago.  Her hemoglobin on admission was 11.3, and with hydration fell to 10.5.  Her MCV was 85, and a retic count was 1.2%.  TSH was 2.73, B12 was 368, RBC folate of 451, and ferratin of 20.  I did not place her on iron at this time as she was having some nausea this admission.  She will see Dr. Arvilla Market in 10 days, and she will be given guaiac cards and started on iron.  #6 - SUBTLE NEUROLOGIC SYMPTOMS:  Ms. Fiala had left extremity numbness and tingling which is somewhat better, but still persistent.  She also had complaints of mild right-sided weakness which has almost completely resolved. We could not find any evidence of structural abnormalities on MRI, and it may be that this was related to the migraine headaches.  At this point would advise no specific therapy, but close follow up.  If she should get worse or have ongoing problems, Dr. Thad Ranger would be happy to see the patient back in his office.  #7 - HYPOKALEMIA:  On admission Ms. Friscia had a slightly low potassium, and I suspect this is due to diuretic therapy.  We replaced her  orally, and she was not left on hydrochlorothiazide because her blood pressures were stable.  If she is put back on this, would recommend checking a potassium in approximately one week.  #8 - HYPERTENSION:  Ms. Reids blood pressure has been stable off all medications.  Her appetite has been poor, and it may be that when she is eating and drinking normally her pressure will go back up.  Dr. Arvilla Market will check this as an outpatient and consider resumption of hydrochlorothiazide.  OTHER PERTINENT LABORATORY DATA:  ESR 20.  Discharge potassium 3.9.  Admission potassium 3.2.  Liver function tests normal.  CSF fluid clear and colorless with 0 RBCs and 1 WBC, protein 28, glucose 60, culture negative. DD:  09/23/00 TD:  09/24/00 Job: [id number]

## 2011-05-04 NOTE — Discharge Summary (Signed)
[place]. [facility]  Patient:    Morgan [name], Morgan [name]                        MRN: [id number] Adm. Date:  [id number] Disc. Date: 11/27/00 Attending:  Alric Quan Dictator:   Delton See, P.A. CC:         Desma Maxim, M.D.   Discharge Summary  DATE OF BIRTH:  29-Nov-1951  HISTORY OF PRESENT ILLNESS:  [name] is a 60 year old female with a history of exertional chest pain x 10 years, a history of hypertension, anxiety, and depression, heat stroke, multiple surgeries, and she was seen in November of this year by Dr. Nathen May, after referral for unresponsive spells and loss of consciousness.  This was not felt to be cardiac.  She a 2-D echocardiogram at that time that was within normal limits.  The patient was to have a stress Cardiolyte on an outpatient basis at some point, although this was not performed.  Her pain was felt to be atypical.  The patient presented to the emergency room at [facility] on November 26, 2000, for an evaluation of chest pain.  She said it felt as if someone were standing on her chest.  She had an electrocardiogram in Dr. Chinita Pester office prior to coming to the emergency room, showing normal sinus rhythm, rate 91 beats per minute, with poor R-wave progression, and nonspecific T-wave abnormalities.  An electrocardiogram at [facility] in the emergency room showed a sinus rhythm, rate 65 beats per minute, and was essentially a normal electrocardiogram.  The patient states she awoke on the morning of admission with left arm numbness at approximately 6 a.m.  She noted a sensation of indigestion in the center of her chest.  She saw Dr. Arvilla Market in his office, and was referred to the emergency room.  She described her pain as a pressure a 5, on a 1/10 scale, associated with shortness of breath.  There was no diaphoresis.  There was mild nausea.  The pain was worse  when lying down.  She had pain for at least three hours.  She received nitroglycerin in Dr. Fonnie Birkenhead office with some relief.  She was pain-free when seen in the emergency room.  CARDIAC RISK FACTORS:  Significant for remote tobacco history.  She quit at least one year ago.  She smoked for 30 years, at least 1-1/2 packs per day. There is an unclear family history of coronary artery disease on her fathers side.  She does not have diabetes mellitus.  She has been treated for hypertension in the past.  She does not know her cholesterol status.  She is not significantly obese.  PAST MEDICAL HISTORY:  Please see above.  ALLERGIES:  No known drug allergies.  CURRENT MEDICATIONS:  Celexa 20 mg q.d.  SOCIAL HISTORY:  The patient has been divorced twice.  She has two children. She lives in [place].  She quit smoking.  She does not use alcohol.  She works as a Occupational psychologist.  She has a lot of stress in her life recently.  HOSPITAL COURSE:  As noted, this patient presented to the emergency room for further evaluation of chest pain.  She was seen by Dr. Cecil Cranker, who felt that a cardiac catheterization was indicated.  The cardiac enzymes were performed, and the patient was ruled out for a myocardial infarction.  On  November 27, 2000, the patient underwent a cardiac catheterization, performed by Dr. Loraine Leriche Pulsipher.  She had a normal left main coronary artery. The left anterior descending coronary artery had a mid-50% lesion.  The left circumflex artery had a proximal 30% lesion.  The right coronary artery had a proximal 20% lesion.  There was some catheter-related spasm of the RCA that was relieved by nitroglycerin.  She had a normal left ventricle with an ejection fraction of 64%.  It was felt that she had moderate nonobstructive coronary artery disease.  DISPOSITION:  Arrangements were made to discharge the patient later that evening, following her cardiac  catheterization.  LABORATORY DATA:  The patient had a fasting lipid profile performed which showed a cholesterol of 177, triglycerides 86, HDL 154, LDL 6.  Chemistry profile was within normal limits, with a BUN of 14, creatinine 0.8, potassium 3.7.  Cardiac enzymes were negative.  The patient was noted to be anemic with a hemoglobin of 10.7, hematocrit 31.3, wbcs 5900, platelets 365,000.  A chest x-ray in the emergency room showed no active disease.  DISCHARGE MEDICATIONS: 1. Celexa, to be taken as previously. 2. Coated aspirin 325 mg q.d. 3. Protonix 40 mg q.d. 4. Xanax 0.25 mg, to be taken 1/2 to 1 tablet, two to three times q.d.    p.r.n., #30, no refills. 5. A prescription for nitroglycerin in case she has further coronary    spasm.  ACTIVITIES:  The patient is told to avoid any strenuous activity or driving for two days.  DIET:  She is to be on a regular diet.  INSTRUCTIONS:  She is told to call the office if she has any increased pain, swelling, or bleeding from her groin.  FOLLOWUP:  She is to have an outpatient tilt table test in approximately one week, to further evaluate her previous syncopal spells.  She is to follow up with Dr. Arvilla Market in two to three weeks for further evaluation of her anemia. She is to see Dr. Graciela Husbands following her tilt table test.  DISCHARGE DIAGNOSES: 1. Chest pain, with negative enzymes. 2. Cardiac catheterization with mild to moderate nonobstructive    coronary artery disease and catheter-induced spasm, with an    ejection fraction of 64%. 3. History of syncope. 4. Anemia, of uncertain etiology. 5. Remote tobacco history. 6. History of anxiety and depression. 7. History of migraine headaches. DD:  11/27/00 TD:  11/27/00 Job: [id number]

## 2011-05-04 NOTE — Cardiovascular Report (Signed)
[place]. [facility]  Patient:    Morgan [name], Morgan [name]                        MRN: [id number] Proc. Date: 11/27/00 Adm. Date:  [id number] Attending:  Alric Quan CC:         Desma Maxim, M.D.  Nathen May, M.D., [facility]  Cath Lab   Cardiac Catheterization  PROCEDURE:  Left heart catheterization with coronary angiography and left ventriculography.  INDICATIONS:  Ms. Hatchell is a 60 year old woman admitted with prolonged episode of substernal chest pain.  DESCRIPTION OF PROCEDURE:  A 6 French sheath was placed in the right femoral artery.  Standard Judkins 6 French catheters were utilized.  The patient did have some catheter-induced spasm of the proximal right coronary artery which was relieved with intracoronary nitroglycerin.  There were no complications.  RESULTS:  HEMODYNAMICS:  Left ventricular pressure 100/16, aortic pressure 102/68. There is no aortic valve gradient.  LEFT VENTRICULOGRAM:  Wall motion is normal.  Ejection fraction calculated at 64%.  No mitral regurgitation.  CORONARY ANGIOGRAPHY:  (Right dominant)  Left main is normal.  Left anterior descending artery has a tubular 40 to 50% stenosis in the mid to distal vessel.  The LAD gives rise to a single small diagonal branch.  Left circumflex has a 30% stenosis in the proximal vessel.  It gives rise to a normal size ramus intermediate, small OM1, large OM2, and a small OM3.  Right coronary artery has a 20% stenosis in the proximal vessel.  There was significant catheter-induced spasm in this area which was relieved with intracoronary nitroglycerin.  The distal right coronary artery gives rise to a normal size posterior descending artery, a small first and second posterolateral branches and a normal size third posterolateral branch.  IMPRESSION: 1. Normal left ventricular systolic function. 2. Moderate, but nonobstructive coronary artery disease. DD:   11/27/00 TD:  11/27/00 Job: [id number]

## 2011-06-12 ENCOUNTER — Inpatient Hospital Stay: Payer: Self-pay | Admitting: Internal Medicine

## 2011-06-13 ENCOUNTER — Inpatient Hospital Stay: Payer: Self-pay | Admitting: Psychiatry

## 2011-09-24 LAB — DIFFERENTIAL
Basophils Absolute: 0
Lymphocytes Relative: 39
Lymphs Abs: 3.1
Monocytes Absolute: 0.5
Monocytes Relative: 7
Neutro Abs: 4.3

## 2011-09-24 LAB — CBC
HCT: 34.1 — ABNORMAL LOW
MCV: 85.5
Platelets: 333
RDW: 14.5

## 2011-09-24 LAB — COMPREHENSIVE METABOLIC PANEL
Albumin: 2.9 — ABNORMAL LOW
BUN: 24 — ABNORMAL HIGH
Calcium: 8.8
Creatinine, Ser: 0.72
Total Protein: 5.4 — ABNORMAL LOW

## 2011-09-27 LAB — PROTIME-INR: INR: 0.8

## 2011-09-27 LAB — DIFFERENTIAL
Basophils Absolute: 0
Eosinophils Relative: 2
Lymphocytes Relative: 30
Monocytes Absolute: 0.4

## 2011-09-27 LAB — CBC
HCT: 33.9 — ABNORMAL LOW
Hemoglobin: 11.2 — ABNORMAL LOW
RDW: 14.3 — ABNORMAL HIGH

## 2011-09-27 LAB — APTT: aPTT: 31

## 2011-09-27 LAB — BASIC METABOLIC PANEL
CO2: 26
GFR calc non Af Amer: >60
Glucose, Bld: 103 — ABNORMAL HIGH
Potassium: 3.4 — ABNORMAL LOW
Sodium: 140

## 2012-01-10 IMAGING — CT CT L SPINE W/ CM
4 of 11 series · 10 of 33 positions shown, 12 images · IV contrast (omnipaque)
Comparison: Outside MRI 11/28/2010.

MYELOGRAM INJECTION
TECHNIQUE: Informed consent was obtained from the patient prior to
the procedure, including potential complications of headache,
allergy, infection and pain. Specific instructions were given
regarding 24 hour bedrest post procedure to prevent post-LP
headache.  A timeout procedure was performed.  With the patient
prone, the lower back was prepped with Betadine.  1% Lidocaine was
used for local anesthesia.  Lumbar puncture was performed by the
radiologist at the L3-L4 level using a 22 gauge needle with return
of clear CSF.  15 cc of Omnipaque 180 was injected into the
subarachnoid space .
CLINICAL DATA: Low back pain radiating to both legs.
TECHNIQUE: Multidetector CT imaging of the lumbar spine was
performed following myelography.  Multiplanar CT image
reconstructions were also generated.

[Series 2: l spine bone · axial · 0.27mm/px · z∈[-290,-200]mm · 2 of 109 slices shown, 3 images]
[im 37/109  soft-tissue]
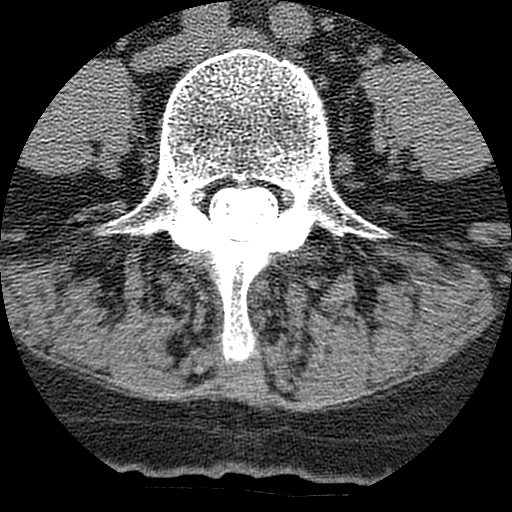
[im 37/109  bone]
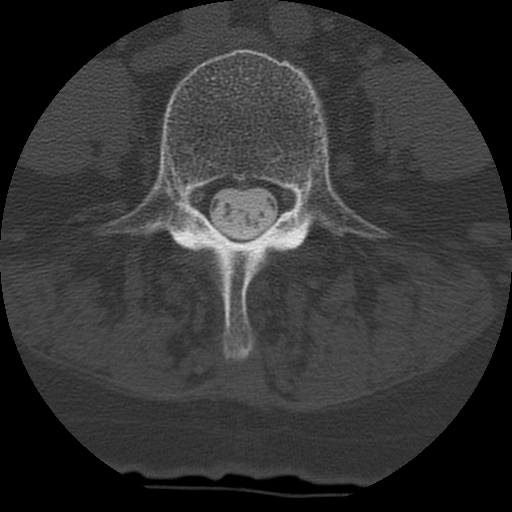
[im 73/109  bone]
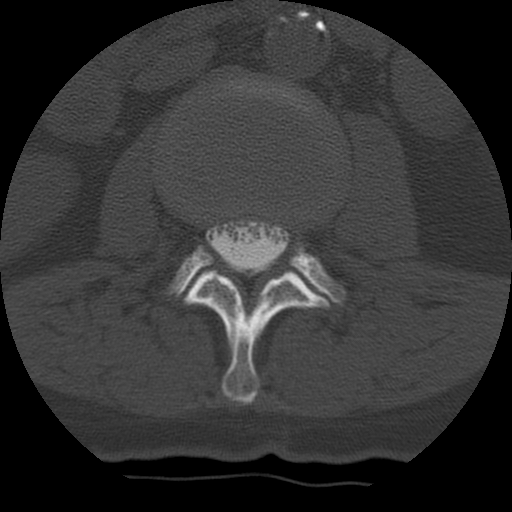

[Series 3: l spine soft · axial · 0.27mm/px · z∈[-290,-200]mm · 2 of 109 slices shown]
[im 37/109  soft-tissue]
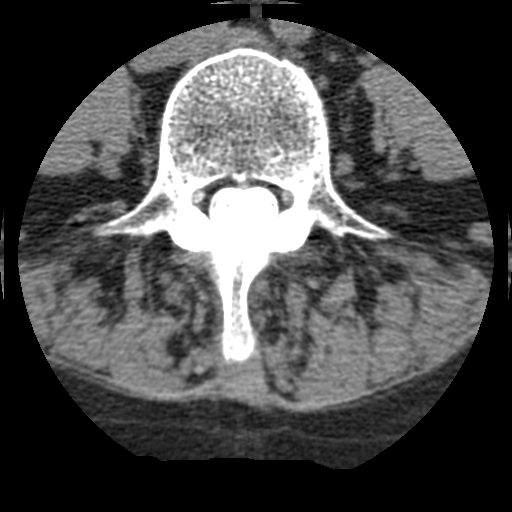
[im 73/109  soft-tissue]
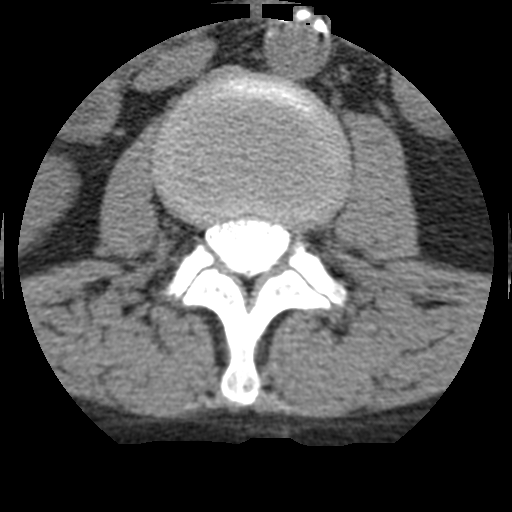

[Series 104: cor/lower level · coronal · 0.27mm/px · 1 of 50 slices shown]
[im 25/50  bone]
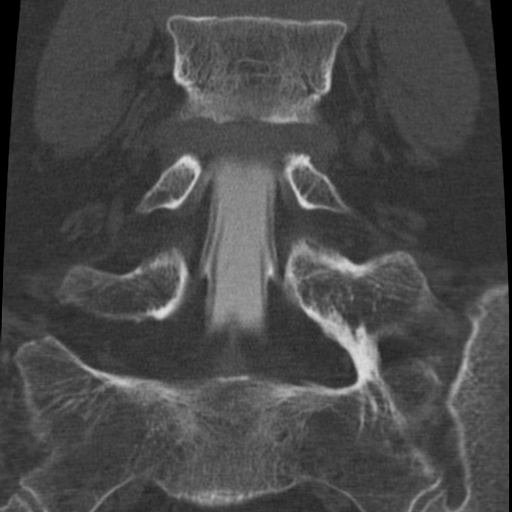

[Series 105: sag · sagittal · 0.54mm/px · 5 of 50 slices shown, 6 images]
[im 17/50  bone]
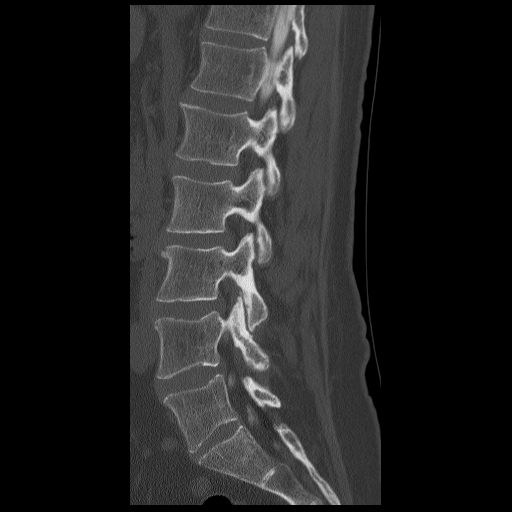
[im 21/50  bone]
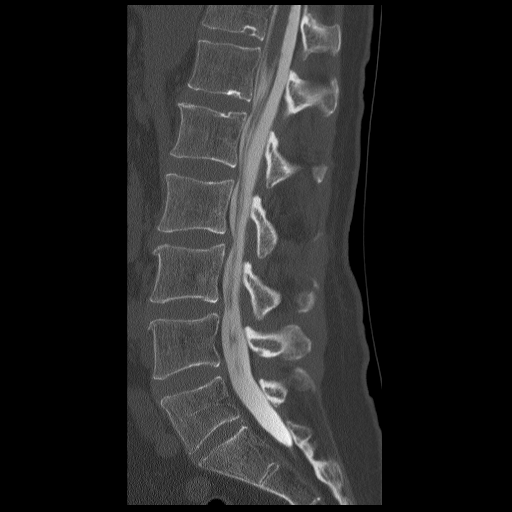
[im 25/50  soft-tissue]
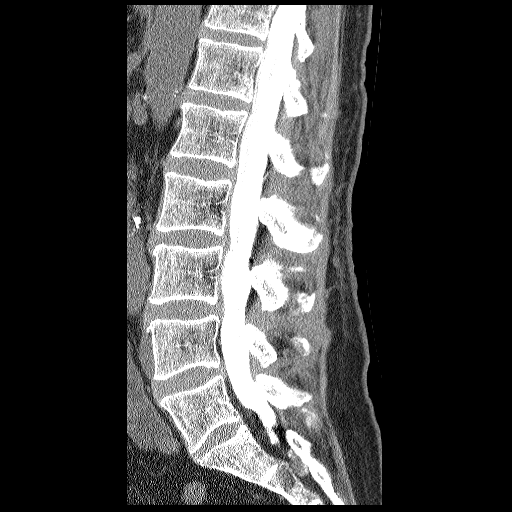
[im 25/50  bone]
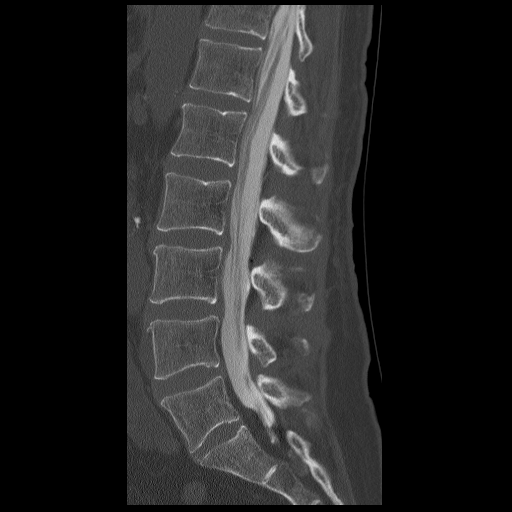
[im 29/50  bone]
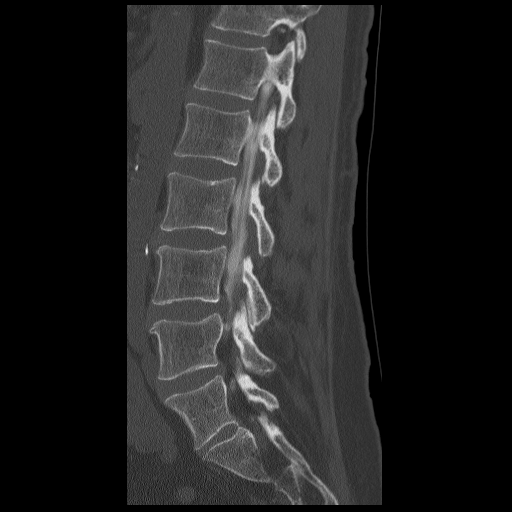
[im 33/50  bone]
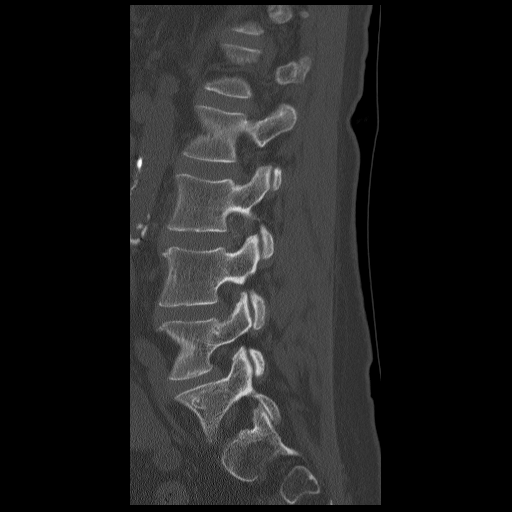

[10 of 33 positions shown; findings below may reference images not displayed]

IMPRESSION: Successful injection of  intrathecal contrast for myelography.

MYELOGRAM LUMBAR
FINDINGS: Good opacification lumbar subarachnoid space.
Transitional anatomy is present. The S1-S2 interspace is
rudimentary.  S1 is  fully sacralized  on the left but only
partially sacralized on the right.  Today's numbering scheme may be
different from that  used on prior MRI.  Correlate  with prior
cross-sectional imaging prior to any type intervention.

Shallow ventral defects at L3-4 and L4-5.  No significant nerve
root cut off or spinal stenosis at any level.  Anatomic  alignment
is present with the patient prone and erect.  Standing films
demonstrate normal alignment without dynamic instability.  No
increased lateral recess stenosis  with side-to-side bending.  No
anterolisthesis with flexion/extension.

Fluoroscopy Time: 1.14 minutes
IMPRESSION: As above.

CT MYELOGRAPHY LUMBAR SPINE
FINDINGS: No prevertebral or paraspinous masses.  Nonaneurysmal
atherosclerotic calcification of the aorta is noted.

L1-2: Conus ends lower aspect of L1.  No stenosis or disc
protrusion.

L2-3: Normal interspace.  No stenosis or disc protrusion.

L3-4: Shallow central and rightward protrusion.  Mild facet
arthropathy.  No L3 or L4 nerve root encroachment.

L4-5: Central and leftward protrusion extends into the foramen.
Mild facet arthropathy.  No L5 nerve root encroachment in the
canal.  No  significant L4 nerve root encroachment in the foramen.

L5-S1: Mild annular bulging.  No stenosis or disc protrusion.

S1-S2:  Rudimentary interspace.
IMPRESSION: Transitional anatomy.  See comments above.  S1 is only partially
sacralized.

Anatomic alignment without dynamic instability.

Small protrusions at L3-4 and L4-5 are non compressive.

## 2012-01-10 IMAGING — CR DG MYELOGRAM LUMBAR
6 series · 6 of 6 positions shown · IV contrast (omnipaque)
Comparison: Outside MRI 11/28/2010.

MYELOGRAM INJECTION
TECHNIQUE: Informed consent was obtained from the patient prior to
the procedure, including potential complications of headache,
allergy, infection and pain. Specific instructions were given
regarding 24 hour bedrest post procedure to prevent post-LP
headache.  A timeout procedure was performed.  With the patient
prone, the lower back was prepped with Betadine.  1% Lidocaine was
used for local anesthesia.  Lumbar puncture was performed by the
radiologist at the L3-L4 level using a 22 gauge needle with return
of clear CSF.  15 cc of Omnipaque 180 was injected into the
subarachnoid space .
CLINICAL DATA: Low back pain radiating to both legs.
TECHNIQUE: Multidetector CT imaging of the lumbar spine was
performed following myelography.  Multiplanar CT image
reconstructions were also generated.

[view not recorded (1 of 6)]
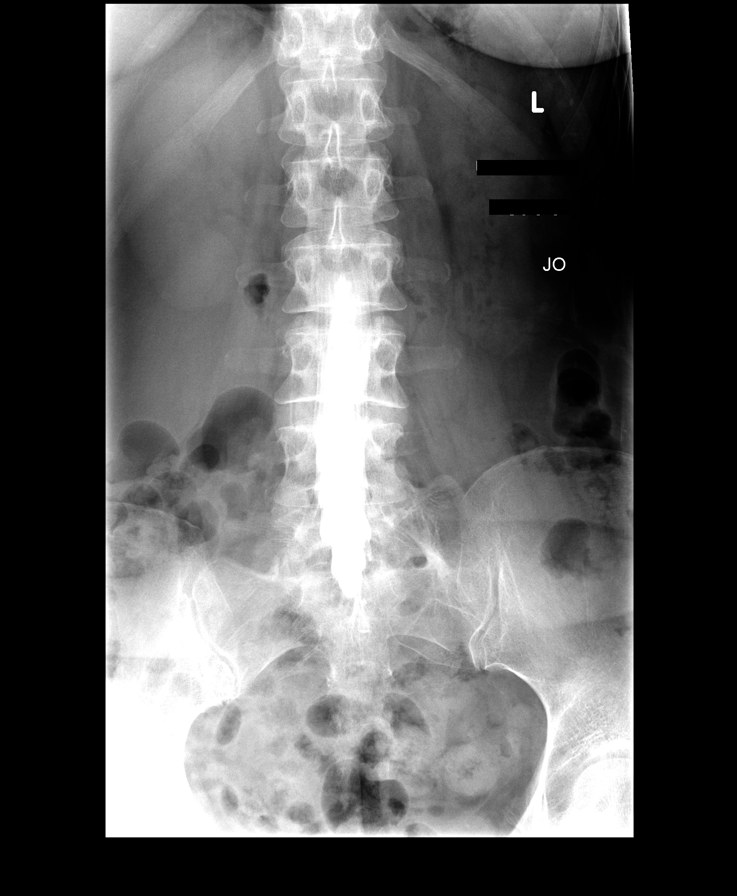

[view not recorded (2 of 6)]
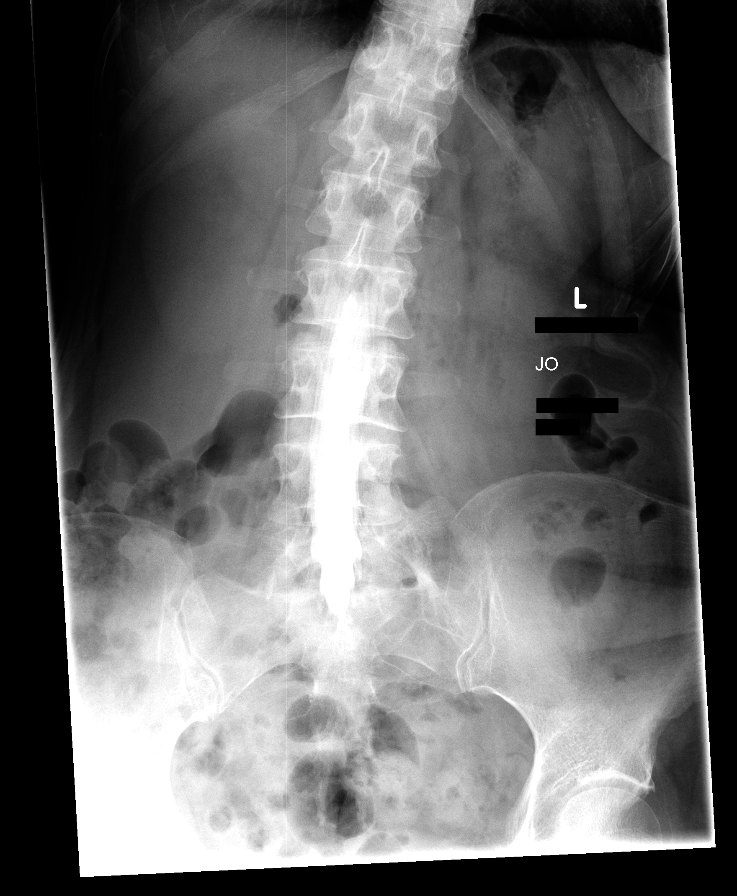

[view not recorded (3 of 6)]
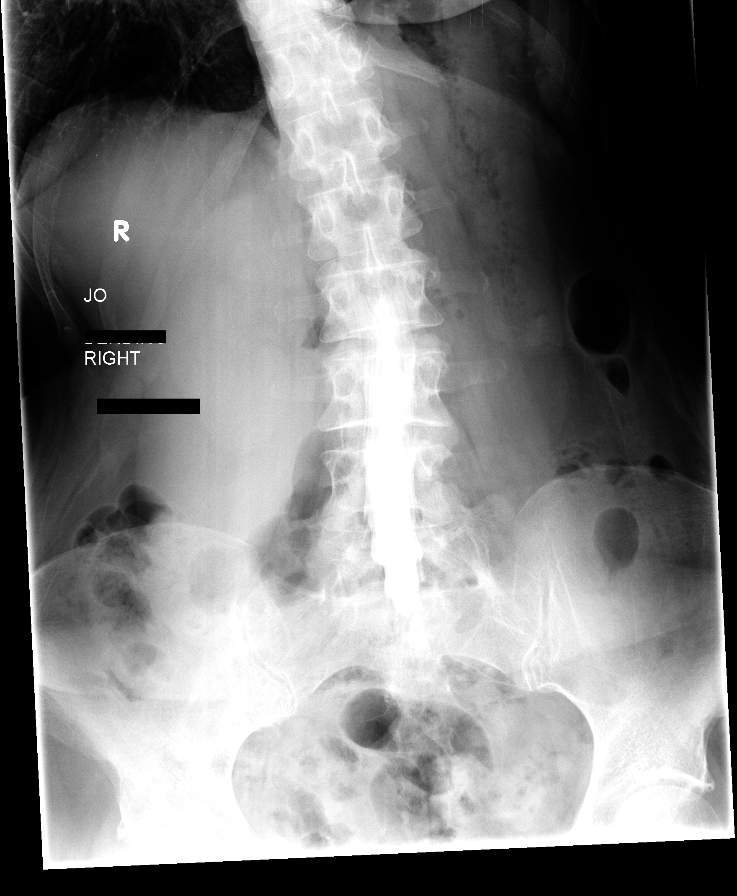

[view not recorded (4 of 6)]
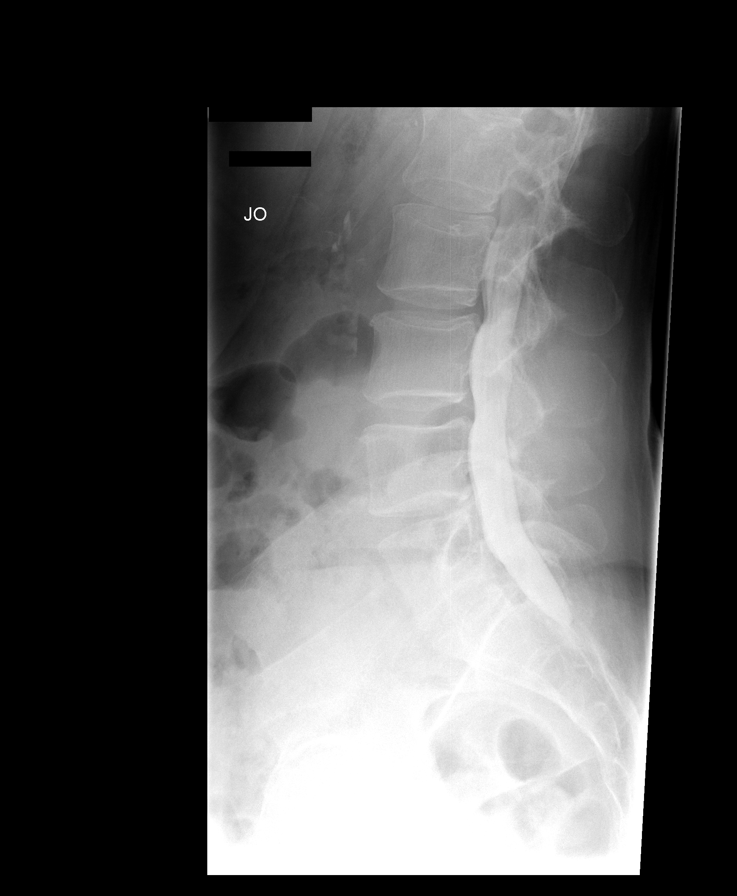

[view not recorded (5 of 6)]
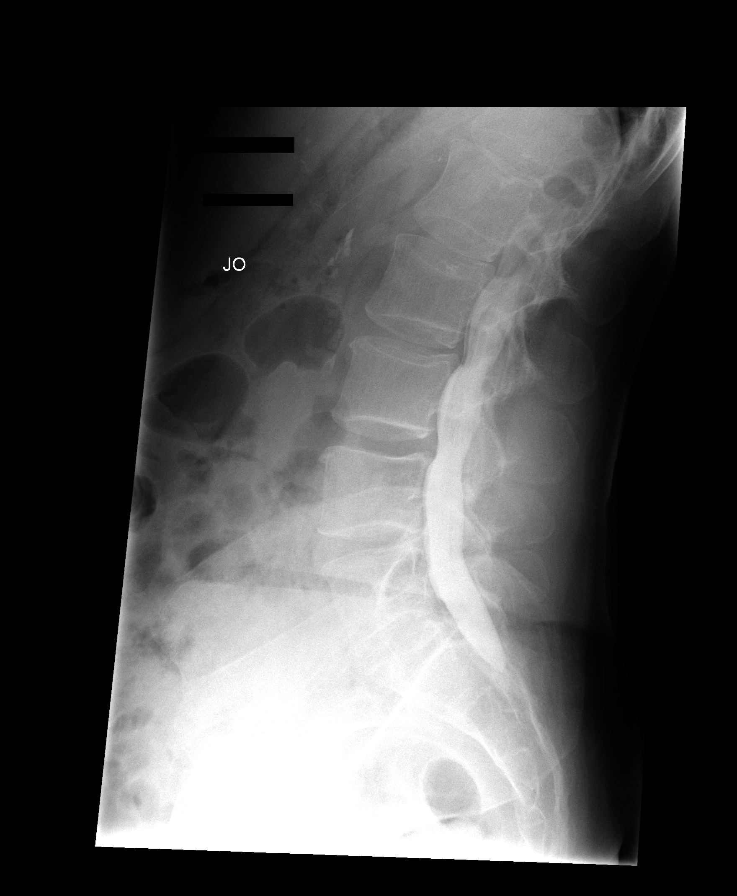

[view not recorded (6 of 6)]
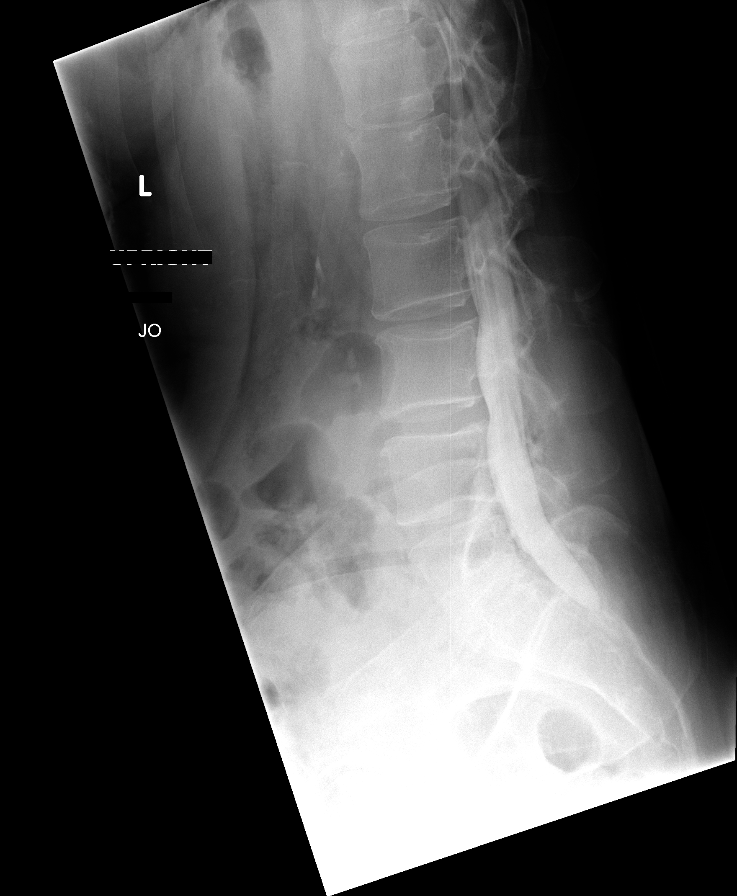

[6 of 6 positions shown; findings below may reference images not displayed]

IMPRESSION: Successful injection of  intrathecal contrast for myelography.

MYELOGRAM LUMBAR
FINDINGS: Good opacification lumbar subarachnoid space.
Transitional anatomy is present. The S1-S2 interspace is
rudimentary.  S1 is  fully sacralized  on the left but only
partially sacralized on the right.  Today's numbering scheme may be
different from that  used on prior MRI.  Correlate  with prior
cross-sectional imaging prior to any type intervention.

Shallow ventral defects at L3-4 and L4-5.  No significant nerve
root cut off or spinal stenosis at any level.  Anatomic  alignment
is present with the patient prone and erect.  Standing films
demonstrate normal alignment without dynamic instability.  No
increased lateral recess stenosis  with side-to-side bending.  No
anterolisthesis with flexion/extension.

Fluoroscopy Time: 1.14 minutes
IMPRESSION: As above.

CT MYELOGRAPHY LUMBAR SPINE
FINDINGS: No prevertebral or paraspinous masses.  Nonaneurysmal
atherosclerotic calcification of the aorta is noted.

L1-2: Conus ends lower aspect of L1.  No stenosis or disc
protrusion.

L2-3: Normal interspace.  No stenosis or disc protrusion.

L3-4: Shallow central and rightward protrusion.  Mild facet
arthropathy.  No L3 or L4 nerve root encroachment.

L4-5: Central and leftward protrusion extends into the foramen.
Mild facet arthropathy.  No L5 nerve root encroachment in the
canal.  No  significant L4 nerve root encroachment in the foramen.

L5-S1: Mild annular bulging.  No stenosis or disc protrusion.

S1-S2:  Rudimentary interspace.
IMPRESSION: Transitional anatomy.  See comments above.  S1 is only partially
sacralized.

Anatomic alignment without dynamic instability.

Small protrusions at L3-4 and L4-5 are non compressive.

## 2012-08-05 ENCOUNTER — Other Ambulatory Visit (HOSPITAL_COMMUNITY): Payer: Self-pay | Admitting: Internal Medicine

## 2012-08-05 DIAGNOSIS — Z1231 Encounter for screening mammogram for malignant neoplasm of breast: Secondary | ICD-10-CM

## 2012-08-27 ENCOUNTER — Ambulatory Visit (HOSPITAL_COMMUNITY)
Admission: RE | Admit: 2012-08-27 | Discharge: 2012-08-27 | Disposition: A | Discharge status: Home / Self Care | Payer: Self-pay | Source: Ambulatory Visit | Attending: Internal Medicine | Admitting: Internal Medicine

## 2012-08-27 DIAGNOSIS — Z1231 Encounter for screening mammogram for malignant neoplasm of breast: Secondary | ICD-10-CM

## 2012-09-01 ENCOUNTER — Other Ambulatory Visit: Payer: Self-pay | Admitting: Internal Medicine

## 2012-09-01 DIAGNOSIS — R928 Other abnormal and inconclusive findings on diagnostic imaging of breast: Secondary | ICD-10-CM

## 2012-09-04 ENCOUNTER — Ambulatory Visit
Admission: RE | Admit: 2012-09-04 | Discharge: 2012-09-04 | Disposition: A | Discharge status: Home / Self Care | Payer: Self-pay | Source: Ambulatory Visit | Attending: Internal Medicine | Admitting: Internal Medicine

## 2012-09-04 DIAGNOSIS — R928 Other abnormal and inconclusive findings on diagnostic imaging of breast: Secondary | ICD-10-CM

## 2012-09-18 ENCOUNTER — Emergency Department: Payer: Self-pay | Admitting: Emergency Medicine

## 2012-09-18 LAB — COMPREHENSIVE METABOLIC PANEL
Alkaline Phosphatase: 163 U/L — ABNORMAL HIGH (ref 50–136)
Anion Gap: 7 (ref 7–16)
Calcium, Total: 9.4 mg/dL (ref 8.5–10.1)
Co2: 33 mmol/L — ABNORMAL HIGH (ref 21–32)
EGFR (African American): >60
Osmolality: 278 (ref 275–301)
Potassium: 3.3 mmol/L — ABNORMAL LOW (ref 3.5–5.1)
SGOT(AST): 19 U/L (ref 15–37)
SGPT (ALT): 10 U/L — ABNORMAL LOW (ref 12–78)

## 2012-09-18 LAB — CBC
HCT: 37.3 % (ref 35.0–47.0)
HGB: 12.4 g/dL (ref 12.0–16.0)
MCH: 28.7 pg (ref 26.0–34.0)
MCHC: 33.3 g/dL (ref 32.0–36.0)
MCV: 86 fL (ref 80–100)
RDW: 14.5 % (ref 11.5–14.5)

## 2012-09-18 LAB — URINALYSIS, COMPLETE
Bilirubin,UR: NEGATIVE
Glucose,UR: NEGATIVE mg/dL (ref 0–75)
Nitrite: POSITIVE
Protein: NEGATIVE
RBC,UR: <1 /HPF (ref 0–5)
Specific Gravity: 1.012 (ref 1.003–1.030)
Squamous Epithelial: 4
WBC UR: 29 /HPF (ref 0–5)

## 2012-09-27 IMAGING — CR DG CHEST 1V PORT
1 series · 1 of 1 positions shown · non-contrast
Comparison: none

REASON FOR EXAM: post intubation
COMMENTS:

[view not recorded]
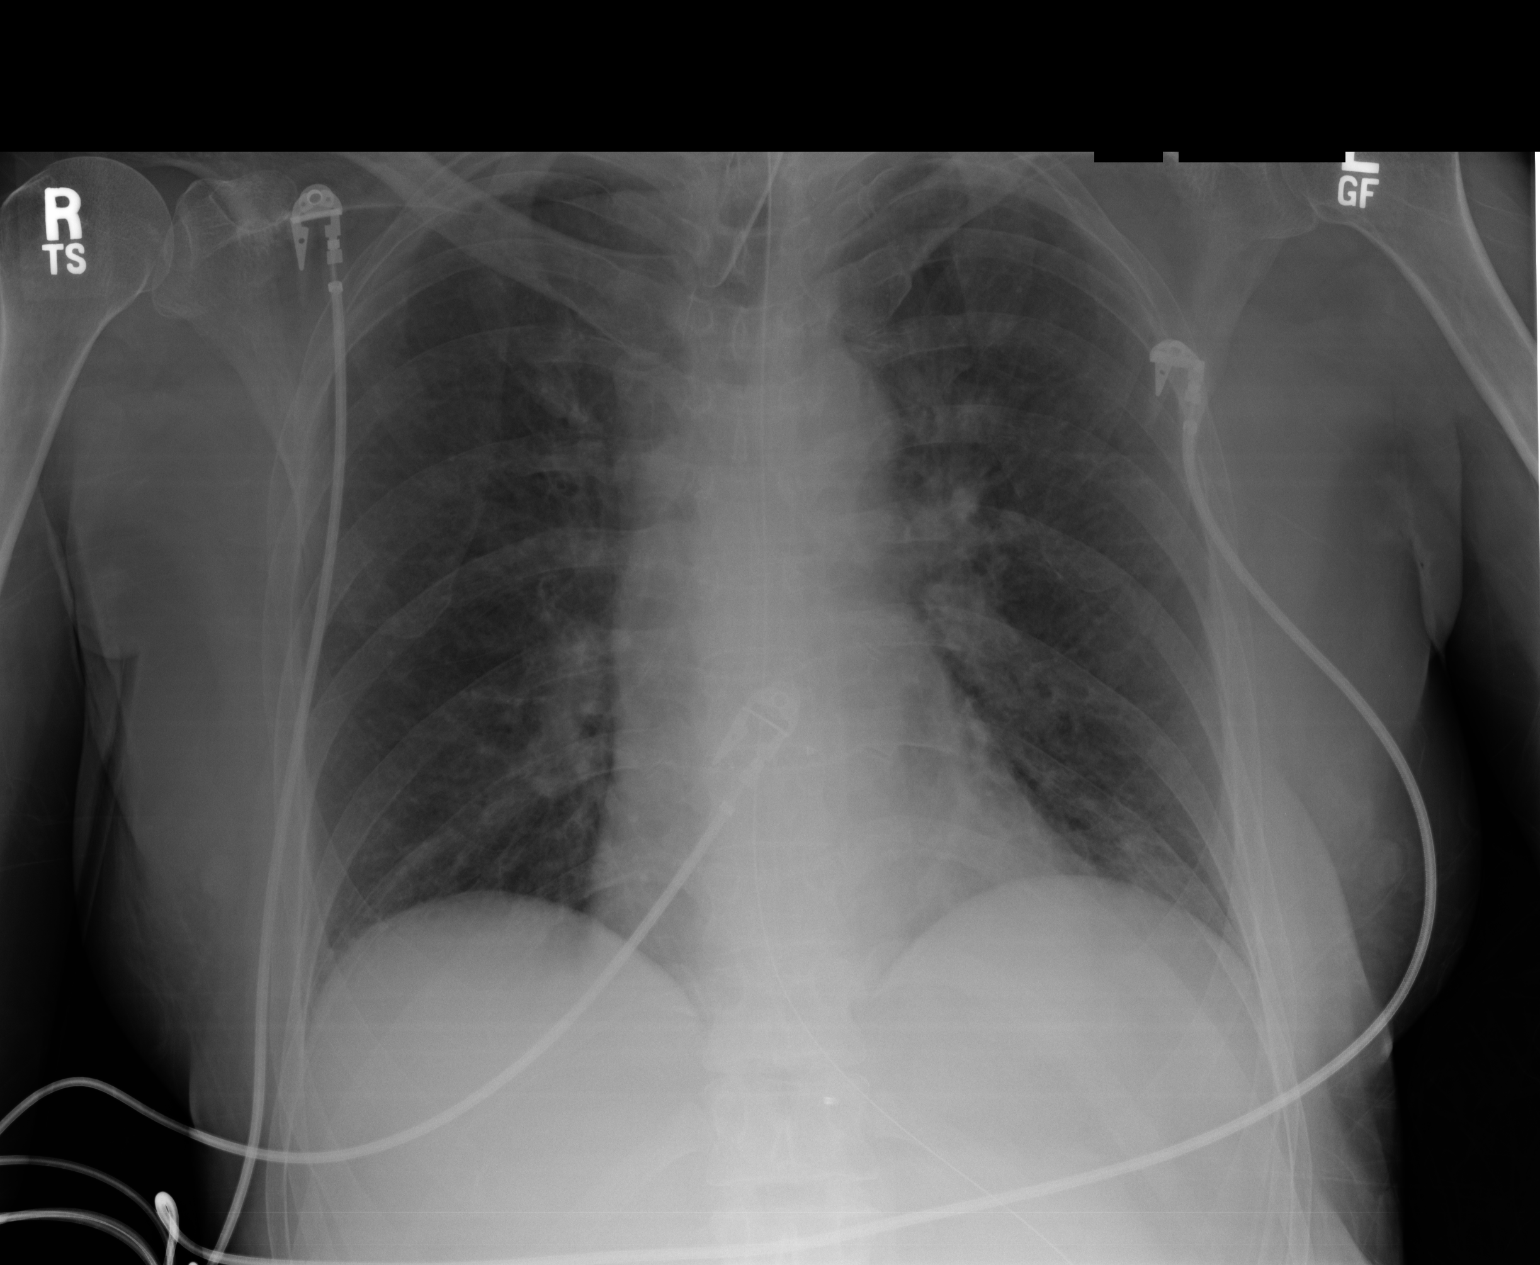

[1 of 1 positions shown; findings below may reference images not displayed]

PROCEDURE:     DXR - DXR PORTABLE CHEST SINGLE VIEW  - June 11, 2011 [DATE]

RESULT:     There is thickening of the lung markings bilaterally compatible
with chronic inflammatory change. No consolidative pulmonary infiltrates are
seen. No pulmonary edema is noted. An endotracheal tube is present with the
tip approximately 6.4 cm above the carina. A nasogastric tube is present
with the distal portion visualized inferior to the left hemidiaphragm.
Monitoring electrodes are present.
IMPRESSION: 1.     An endotracheal tube is present with the tip in good position as
noted above.

## 2013-01-28 ENCOUNTER — Other Ambulatory Visit: Payer: Self-pay | Admitting: Internal Medicine

## 2013-01-28 DIAGNOSIS — N6310 Unspecified lump in the right breast, unspecified quadrant: Secondary | ICD-10-CM

## 2013-04-06 ENCOUNTER — Ambulatory Visit
Admission: RE | Admit: 2013-04-06 | Discharge: 2013-04-06 | Disposition: A | Discharge status: Home / Self Care | Payer: Medicaid Other | Source: Ambulatory Visit | Attending: Internal Medicine | Admitting: Internal Medicine

## 2013-04-06 DIAGNOSIS — N6310 Unspecified lump in the right breast, unspecified quadrant: Secondary | ICD-10-CM

## 2013-04-28 ENCOUNTER — Ambulatory Visit: Payer: Medicare Other

## 2013-04-28 ENCOUNTER — Ambulatory Visit (INDEPENDENT_AMBULATORY_CARE_PROVIDER_SITE_OTHER): Payer: Medicare Other | Admitting: Family Medicine

## 2013-04-28 VITALS — BP 150/90 | HR 80 | Temp 98.0°F | Resp 16 | Ht 73.0 in | Wt 181.0 lb

## 2013-04-28 DIAGNOSIS — M545 Low back pain, unspecified: Secondary | ICD-10-CM

## 2013-04-28 DIAGNOSIS — G8929 Other chronic pain: Secondary | ICD-10-CM

## 2013-04-28 DIAGNOSIS — R202 Paresthesia of skin: Secondary | ICD-10-CM

## 2013-04-28 DIAGNOSIS — R209 Unspecified disturbances of skin sensation: Secondary | ICD-10-CM

## 2013-04-28 MED ORDER — OXYCODONE-ACETAMINOPHEN 10-325 MG PO TABS: 1.0000 | ORAL_TABLET | Freq: Three times a day (TID) | ORAL | Status: DC | PRN
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
## 2013-04-28 NOTE — Patient Instructions (Addendum)
Low Back Sprain  with Rehab   A sprain is an injury in which a ligament is torn. The ligaments of the lower back are vulnerable to sprains. However, they are strong and require great force to be injured. These ligaments are important for stabilizing the spinal column. Sprains are classified into three categories. Grade 1 sprains cause pain, but the tendon is not lengthened. Grade 2 sprains include a lengthened ligament, due to the ligament being stretched or partially ruptured. With grade 2 sprains there is still function, although the function may be decreased. Grade 3 sprains involve a complete tear of the tendon or muscle, and function is usually impaired.  SYMPTOMS   · Severe pain in the lower back.  · Sometimes, a feeling of a "pop," "snap," or tear, at the time of injury.  · Tenderness and sometimes swelling at the injury site.  · Uncommonly, bruising (contusion) within 48 hours of injury.  · Muscle spasms in the back.  CAUSES   Low back sprains occur when a force is placed on the ligaments that is greater than they can handle. Common causes of injury include:  · Performing a stressful act while off-balance.  · Repetitive stressful activities that involve movement of the lower back.  · Direct hit (trauma) to the lower back.  RISK INCREASES WITH:  · Contact sports (football, wrestling).  · Collisions (major skiing accidents).  · Sports that require throwing or lifting (baseball, weightlifting).  · Sports involving twisting of the spine (gymnastics, diving, tennis, golf).  · Poor strength and flexibility.  · Inadequate protection.  · Previous back injury or surgery (especially fusion).  PREVENTION  · Wear properly fitted and padded protective equipment.  · Warm up and stretch properly before activity.  · Allow for adequate recovery between workouts.  · Maintain physical fitness:  · Strength, flexibility, and endurance.  · Cardiovascular fitness.  · Maintain a healthy body weight.  PROGNOSIS   If treated  properly, low back sprains usually heal with non-surgical treatment. The length of time for healing depends on the severity of the injury.   RELATED COMPLICATIONS   · Recurring symptoms, resulting in a chronic problem.  · Chronic inflammation and pain in the low back.  · Delayed healing or resolution of symptoms, especially if activity is resumed too soon.  · Prolonged impairment.  · Unstable or arthritic joints of the low back.  TREATMENT   Treatment first involves the use of ice and medicine, to reduce pain and inflammation. The use of strengthening and stretching exercises may help reduce pain with activity. These exercises may be performed at home or with a therapist. Severe injuries may require referral to a therapist for further evaluation and treatment, such as ultrasound. Your caregiver may advise that you wear a back brace or corset, to help reduce pain and discomfort. Often, prolonged bed rest results in greater harm then benefit. Corticosteroid injections may be recommended. However, these should be reserved for the most serious cases. It is important to avoid using your back when lifting objects. At night, sleep on your back on a firm mattress, with a pillow placed under your knees. If non-surgical treatment is unsuccessful, surgery may be needed.   MEDICATION   · If pain medicine is needed, nonsteroidal anti-inflammatory medicines (aspirin and ibuprofen), or other minor pain relievers (acetaminophen), are often advised.  · Do not take pain medicine for 7 days before surgery.  · Prescription pain relievers may be given, if your caregiver   thinks they are needed. Use only as directed and only as much as you need.  · Ointments applied to the skin may be helpful.  · Corticosteroid injections may be given by your caregiver. These injections should be reserved for the most serious cases, because they may only be given a certain number of times.  HEAT AND COLD  · Cold treatment (icing) should be applied for 10  to 15 minutes every 2 to 3 hours for inflammation and pain, and immediately after activity that aggravates your symptoms. Use ice packs or an ice massage.  · Heat treatment may be used before performing stretching and strengthening activities prescribed by your caregiver, physical therapist, or athletic trainer. Use a heat pack or a warm water soak.  SEEK MEDICAL CARE IF:   · Symptoms get worse or do not improve in 2 to 4 weeks, despite treatment.  · You develop numbness or weakness in either leg.  · You lose bowel or bladder function.  · Any of the following occur after surgery: fever, increased pain, swelling, redness, drainage of fluids, or bleeding in the affected area.  · New, unexplained symptoms develop. (Drugs used in treatment may produce side effects.)  EXERCISES   RANGE OF MOTION (ROM) AND STRETCHING EXERCISES - Low Back Sprain  Most people with lower back pain will find that their symptoms get worse with excessive bending forward (flexion) or arching at the lower back (extension). The exercises that will help resolve your symptoms will focus on the opposite motion.   Your physician, physical therapist or athletic trainer will help you determine which exercises will be most helpful to resolve your lower back pain. Do not complete any exercises without first consulting with your caregiver. Discontinue any exercises which make your symptoms worse, until you speak to your caregiver.  If you have pain, numbness or tingling which travels down into your buttocks, leg or foot, the goal of the therapy is for these symptoms to move closer to your back and eventually resolve. Sometimes, these leg symptoms will get better, but your lower back pain may worsen. This is often an indication of progress in your rehabilitation. Be very alert to any changes in your symptoms and the activities in which you participated in the 24 hours prior to the change. Sharing this information with your caregiver will allow him or her to  most efficiently treat your condition.  These exercises may help you when beginning to rehabilitate your injury. Your symptoms may resolve with or without further involvement from your physician, physical therapist or athletic trainer. While completing these exercises, remember:   · Restoring tissue flexibility helps normal motion to return to the joints. This allows healthier, less painful movement and activity.  · An effective stretch should be held for at least 30 seconds.  · A stretch should never be painful. You should only feel a gentle lengthening or release in the stretched tissue.  FLEXION RANGE OF MOTION AND STRETCHING EXERCISES:  STRETCH - Flexion, Single Knee to Chest   · Lie on a firm bed or floor with both legs extended in front of you.  · Keeping one leg in contact with the floor, bring your opposite knee to your chest. Hold your leg in place by either grabbing behind your thigh or at your knee.  · Pull until you feel a gentle stretch in your low back. Hold __________ seconds.  · Slowly release your grasp and repeat the exercise with the opposite side.  Repeat   __________ times. Complete this exercise __________ times per day.   STRETCH - Flexion, Double Knee to Chest  · Lie on a firm bed or floor with both legs extended in front of you.  · Keeping one leg in contact with the floor, bring your opposite knee to your chest.  · Tense your stomach muscles to support your back and then lift your other knee to your chest. Hold your legs in place by either grabbing behind your thighs or at your knees.  · Pull both knees toward your chest until you feel a gentle stretch in your low back. Hold __________ seconds.  · Tense your stomach muscles and slowly return one leg at a time to the floor.  Repeat __________ times. Complete this exercise __________ times per day.   STRETCH - Low Trunk Rotation  · Lie on a firm bed or floor. Keeping your legs in front of you, bend your knees so they are both pointed toward the  ceiling and your feet are flat on the floor.  · Extend your arms out to the side. This will stabilize your upper body by keeping your shoulders in contact with the floor.  · Gently and slowly drop both knees together to one side until you feel a gentle stretch in your low back. Hold for __________ seconds.  · Tense your stomach muscles to support your lower back as you bring your knees back to the starting position. Repeat the exercise to the other side.  Repeat __________ times. Complete this exercise __________ times per day   EXTENSION RANGE OF MOTION AND FLEXIBILITY EXERCISES:  STRETCH - Extension, Prone on Elbows   · Lie on your stomach on the floor, a bed will be too soft. Place your palms about shoulder width apart and at the height of your head.  · Place your elbows under your shoulders. If this is too painful, stack pillows under your chest.  · Allow your body to relax so that your hips drop lower and make contact more completely with the floor.  · Hold this position for __________ seconds.  · Slowly return to lying flat on the floor.  Repeat __________ times. Complete this exercise __________ times per day.   RANGE OF MOTION - Extension, Prone Press Ups  · Lie on your stomach on the floor, a bed will be too soft. Place your palms about shoulder width apart and at the height of your head.  · Keeping your back as relaxed as possible, slowly straighten your elbows while keeping your hips on the floor. You may adjust the placement of your hands to maximize your comfort. As you gain motion, your hands will come more underneath your shoulders.  · Hold this position __________ seconds.  · Slowly return to lying flat on the floor.  Repeat __________ times. Complete this exercise __________ times per day.   RANGE OF MOTION- Quadruped, Neutral Spine   · Assume a hands and knees position on a firm surface. Keep your hands under your shoulders and your knees under your hips. You may place padding under your knees for  comfort.  · Drop your head and point your tailbone toward the ground below you. This will round out your lower back like an angry cat. Hold this position for __________ seconds.  · Slowly lift your head and release your tail bone so that your back sags into a large arch, like an old horse.  · Hold this position for __________ seconds.  ·   Repeat this until you feel limber in your low back.  · Now, find your "sweet spot." This will be the most comfortable position somewhere between the two previous positions. This is your neutral spine. Once you have found this position, tense your stomach muscles to support your low back.  · Hold this position for __________ seconds.  Repeat __________ times. Complete this exercise __________ times per day.   STRENGTHENING EXERCISES - Low Back Sprain  These exercises may help you when beginning to rehabilitate your injury. These exercises should be done near your "sweet spot." This is the neutral, low-back arch, somewhere between fully rounded and fully arched, that is your least painful position. When performed in this safe range of motion, these exercises can be used for people who have either a flexion or extension based injury. These exercises may resolve your symptoms with or without further involvement from your physician, physical therapist or athletic trainer. While completing these exercises, remember:   · Muscles can gain both the endurance and the strength needed for everyday activities through controlled exercises.  · Complete these exercises as instructed by your physician, physical therapist or athletic trainer. Increase the resistance and repetitions only as guided.  · You may experience muscle soreness or fatigue, but the pain or discomfort you are trying to eliminate should never worsen during these exercises. If this pain does worsen, stop and make certain you are following the directions exactly. If the pain is still present after adjustments, discontinue the  exercise until you can discuss the trouble with your caregiver.  STRENGTHENING - Deep Abdominals, Pelvic Tilt   · Lie on a firm bed or floor. Keeping your legs in front of you, bend your knees so they are both pointed toward the ceiling and your feet are flat on the floor.  · Tense your lower abdominal muscles to press your low back into the floor. This motion will rotate your pelvis so that your tail bone is scooping upwards rather than pointing at your feet or into the floor.  With a gentle tension and even breathing, hold this position for __________ seconds.  Repeat __________ times. Complete this exercise __________ times per day.   STRENGTHENING - Abdominals, Crunches   · Lie on a firm bed or floor. Keeping your legs in front of you, bend your knees so they are both pointed toward the ceiling and your feet are flat on the floor. Cross your arms over your chest.  · Slightly tip your chin down without bending your neck.  · Tense your abdominals and slowly lift your trunk high enough to just clear your shoulder blades. Lifting higher can put excessive stress on the lower back and does not further strengthen your abdominal muscles.  · Control your return to the starting position.  Repeat __________ times. Complete this exercise __________ times per day.   STRENGTHENING - Quadruped, Opposite UE/LE Lift   · Assume a hands and knees position on a firm surface. Keep your hands under your shoulders and your knees under your hips. You may place padding under your knees for comfort.  · Find your neutral spine and gently tense your abdominal muscles so that you can maintain this position. Your shoulders and hips should form a rectangle that is parallel with the floor and is not twisted.  · Keeping your trunk steady, lift your right hand no higher than your shoulder and then your left leg no higher than your hip. Make sure you are not holding   your breath. Hold this position for __________ seconds.  · Continuing to keep  your abdominal muscles tense and your back steady, slowly return to your starting position. Repeat with the opposite arm and leg.  Repeat __________ times. Complete this exercise __________ times per day.   STRENGTHENING - Abdominals and Quadriceps, Straight Leg Raise   · Lie on a firm bed or floor with both legs extended in front of you.  · Keeping one leg in contact with the floor, bend the other knee so that your foot can rest flat on the floor.  · Find your neutral spine, and tense your abdominal muscles to maintain your spinal position throughout the exercise.  · Slowly lift your straight leg off the floor about 6 inches for a count of 15, making sure to not hold your breath.  · Still keeping your neutral spine, slowly lower your leg all the way to the floor.  Repeat this exercise with each leg __________ times. Complete this exercise __________ times per day.  POSTURE AND BODY MECHANICS CONSIDERATIONS - Low Back Sprain  Keeping correct posture when sitting, standing or completing your activities will reduce the stress put on different body tissues, allowing injured tissues a chance to heal and limiting painful experiences. The following are general guidelines for improved posture. Your physician or physical therapist will provide you with any instructions specific to your needs. While reading these guidelines, remember:  · The exercises prescribed by your provider will help you have the flexibility and strength to maintain correct postures.  · The correct posture provides the best environment for your joints to work. All of your joints have less wear and tear when properly supported by a spine with good posture. This means you will experience a healthier, less painful body.  · Correct posture must be practiced with all of your activities, especially prolonged sitting and standing. Correct posture is as important when doing repetitive low-stress activities (typing) as it is when doing a single heavy-load  activity (lifting).  RESTING POSITIONS  Consider which positions are most painful for you when choosing a resting position. If you have pain with flexion-based activities (sitting, bending, stooping, squatting), choose a position that allows you to rest in a less flexed posture. You would want to avoid curling into a fetal position on your side. If your pain worsens with extension-based activities (prolonged standing, working overhead), avoid resting in an extended position such as sleeping on your stomach. Most people will find more comfort when they rest with their spine in a more neutral position, neither too rounded nor too arched. Lying on a non-sagging bed on your side with a pillow between your knees, or on your back with a pillow under your knees will often provide some relief. Keep in mind, being in any one position for a prolonged period of time, no matter how correct your posture, can still lead to stiffness.  PROPER SITTING POSTURE  In order to minimize stress and discomfort on your spine, you must sit with correct posture. Sitting with good posture should be effortless for a healthy body. Returning to good posture is a gradual process. Many people can work toward this most comfortably by using various supports until they have the flexibility and strength to maintain this posture on their own.  When sitting with proper posture, your ears will fall over your shoulders and your shoulders will fall over your hips. You should use the back of the chair to support your upper back. Your   lower back will be in a neutral position, just slightly arched. You may place a small pillow or folded towel at the base of your lower back for   support.   When working at a desk, create an environment that supports good, upright posture. Without extra support, muscles tire, which leads to excessive strain on joints and other tissues. Keep these recommendations in mind:  CHAIR:  · A chair should be able to slide under your desk  when your back makes contact with the back of the chair. This allows you to work closely.  · The chair's height should allow your eyes to be level with the upper part of your monitor and your hands to be slightly lower than your elbows.  BODY POSITION  · Your feet should make contact with the floor. If this is not possible, use a foot rest.  · Keep your ears over your shoulders. This will reduce stress on your neck and low back.  INCORRECT SITTING POSTURES   If you are feeling tired and unable to assume a healthy sitting posture, do not slouch or slump. This puts excessive strain on your back tissues, causing more damage and pain. Healthier options include:  · Using more support, like a lumbar pillow.  · Switching tasks to something that requires you to be upright or walking.  · Talking a brief walk.  · Lying down to rest in a neutral-spine position.  PROLONGED STANDING WHILE SLIGHTLY LEANING FORWARD   When completing a task that requires you to lean forward while standing in one place for a long time, place either foot up on a stationary 2-4 inch high object to help maintain the best posture. When both feet are on the ground, the lower back tends to lose its slight inward curve. If this curve flattens (or becomes too large), then the back and your other joints will experience too much stress, tire more quickly, and can cause pain.  CORRECT STANDING POSTURES  Proper standing posture should be assumed with all daily activities, even if they only take a few moments, like when brushing your teeth. As in sitting, your ears should fall over your shoulders and your shoulders should fall over your hips. You should keep a slight tension in your abdominal muscles to brace your spine. Your tailbone should point down to the ground, not behind your body, resulting in an over-extended swayback posture.   INCORRECT STANDING POSTURES   Common incorrect standing postures include a forward head, locked knees and/or an excessive  swayback.  WALKING  Walk with an upright posture. Your ears, shoulders and hips should all line-up.  PROLONGED ACTIVITY IN A FLEXED POSITION  When completing a task that requires you to bend forward at your waist or lean over a low surface, try to find a way to stabilize 3 out of 4 of your limbs. You can place a hand or elbow on your thigh or rest a knee on the surface you are reaching across. This will provide you more stability, so that your muscles do not tire as quickly. By keeping your knees relaxed, or slightly bent, you will also reduce stress across your lower back.  CORRECT LIFTING TECHNIQUES  DO :  · Assume a wide stance. This will provide you more stability and the opportunity to get as close as possible to the object which you are lifting.  · Tense your abdominals to brace your spine. Bend at the knees and hips. Keeping your back locked in

## 2013-04-29 ENCOUNTER — Encounter: Payer: Self-pay | Admitting: Family Medicine

## 2013-04-29 DIAGNOSIS — G8929 Other chronic pain: Secondary | ICD-10-CM | POA: Insufficient documentation

## 2013-04-29 DIAGNOSIS — G629 Polyneuropathy, unspecified: Secondary | ICD-10-CM | POA: Insufficient documentation

## 2013-04-29 DIAGNOSIS — M545 Low back pain, unspecified: Secondary | ICD-10-CM | POA: Insufficient documentation

## 2013-04-29 DIAGNOSIS — R202 Paresthesia of skin: Secondary | ICD-10-CM | POA: Insufficient documentation

## 2013-05-11 ENCOUNTER — Emergency Department: Payer: Self-pay | Admitting: Emergency Medicine

## 2013-09-22 ENCOUNTER — Other Ambulatory Visit: Payer: Self-pay | Admitting: Internal Medicine

## 2013-09-22 DIAGNOSIS — N63 Unspecified lump in unspecified breast: Secondary | ICD-10-CM

## 2013-10-06 ENCOUNTER — Other Ambulatory Visit: Payer: Medicaid Other

## 2013-10-07 ENCOUNTER — Other Ambulatory Visit: Payer: Self-pay | Admitting: Nurse Practitioner

## 2013-10-07 DIAGNOSIS — N63 Unspecified lump in unspecified breast: Secondary | ICD-10-CM

## 2013-10-20 ENCOUNTER — Ambulatory Visit
Admission: RE | Admit: 2013-10-20 | Discharge: 2013-10-20 | Disposition: A | Discharge status: Home / Self Care | Payer: Medicare Other | Source: Ambulatory Visit | Attending: Nurse Practitioner | Admitting: Nurse Practitioner

## 2013-10-20 ENCOUNTER — Ambulatory Visit
Admission: RE | Admit: 2013-10-20 | Discharge: 2013-10-20 | Disposition: A | Discharge status: Home / Self Care | Payer: Medicaid Other | Source: Ambulatory Visit | Attending: Nurse Practitioner | Admitting: Nurse Practitioner

## 2013-10-20 ENCOUNTER — Other Ambulatory Visit: Payer: Self-pay | Admitting: Nurse Practitioner

## 2013-10-20 DIAGNOSIS — N63 Unspecified lump in unspecified breast: Secondary | ICD-10-CM

## 2013-11-18 ENCOUNTER — Ambulatory Visit (INDEPENDENT_AMBULATORY_CARE_PROVIDER_SITE_OTHER): Payer: Medicare Other | Admitting: Surgery

## 2013-11-18 ENCOUNTER — Encounter (INDEPENDENT_AMBULATORY_CARE_PROVIDER_SITE_OTHER): Payer: Self-pay | Admitting: Surgery

## 2013-11-18 ENCOUNTER — Encounter (INDEPENDENT_AMBULATORY_CARE_PROVIDER_SITE_OTHER): Payer: Self-pay

## 2013-11-18 VITALS — BP 160/100 | HR 72 | Temp 98.6°F | Resp 14 | Ht 73.0 in | Wt 203.2 lb

## 2013-11-18 DIAGNOSIS — N6310 Unspecified lump in the right breast, unspecified quadrant: Secondary | ICD-10-CM | POA: Insufficient documentation

## 2013-11-18 DIAGNOSIS — N63 Unspecified lump in unspecified breast: Secondary | ICD-10-CM

## 2013-11-18 NOTE — Progress Notes (Signed)
General Surgery [facility]  Chief Complaint  Patient presents with  . New Evaluation    eval rt br mass/nodule - referral from [facility]    HISTORY: Patient is a 62 year old female referred by her primary care provider for evaluation of breast mass. Patient states that her primary care provider palpated a mass in the breast. Patient apparently had studies performed in September 2013 including mammogram and ultrasound. Patient underwent repeat studies on 10/20/2013. This shows a an 8 mm mass in the upper outer quadrant of the right breast consistent with a benign fibroadenoma. Remainder of the breast tissue was heterogeneously dense. No worrisome findings were identified. Mass in the upper outer quadrant of the right breast was stable compared to prior studies. Recommendation was for repeat bilateral mammogram and ultrasound in 6 months.  Patient has undergone prior right breast biopsy in 1989 with benign pathology. Family history is notable for breast cancer postmenopausally in the patient's mother. Patient's daughter has undergone a benign breast biopsy.  Patient has had a hysterectomy. She does not take hormone supplements.  Past Medical History  Diagnosis Date  . Allergy   . Anemia   . Anxiety   . Depression   . Neuromuscular disorder   . Hypertension   . Arthritis     Current Outpatient Prescriptions  Medication Sig Dispense Refill  . ARIPiprazole (ABILIFY) 10 MG tablet Take 10 mg by mouth daily.      . DULoxetine (CYMBALTA) 20 MG capsule Take 20 mg by mouth 2 (two) times daily.      Marland Kitchen gabapentin (NEURONTIN) 100 MG capsule Take 100 mg by mouth 3 (three) times daily.      . traZODone (DESYREL) 100 MG tablet Take 200 mg by mouth at bedtime.       No current facility-administered medications for this visit.    Allergies  Allergen Reactions  . Demerol [Meperidine]     Hives     Family History  Problem Relation Age of  Onset  . Cancer Mother     breast and ovarian  . Hypertension Father   . Hyperlipidemia Father   . Cancer Maternal Grandmother     breast  . Cancer Paternal Grandmother   . Cancer Paternal Grandfather   . Cancer Maternal Aunt     breast  . Cancer Maternal Aunt     breast    History   Social History  . Marital Status: Single    Spouse Name: N/A    Number of Children: N/A  . Years of Education: N/A   Social History Main Topics  . Smoking status: Former Smoker    Quit date: 03/17/2013  . Smokeless tobacco: Never Used  . Alcohol Use: No  . Drug Use: No  . Sexual Activity: None   Other Topics Concern  . None   Social History Narrative  . None    REVIEW OF SYSTEMS - PERTINENT POSITIVES ONLY: Patient denies nipple discharge. She notes intermittent breast tenderness and pain.  EXAM: Filed Vitals:   11/18/13 0906  BP: 160/100  Pulse: 72  Temp: 98.6 F (37 C)  Resp: 14    HEENT: normocephalic; pupils equal and reactive; sclerae clear; dentition good; mucous membranes moist NECK:  No palpable masses in the thyroid bed; symmetric on extension; no palpable anterior or posterior cervical lymphadenopathy; no supraclavicular masses; no tenderness CHEST: clear to auscultation bilaterally without rales, rhonchi, or wheezes CARDIAC: regular rate and rhythm  without significant murmur; peripheral pulses are full BREAST: right breast shows normal nipple areolar complex; well-healed incision inferior lateral right breast consistent with previous biopsy; breast parenchyma is dense and diffusely nodular without discrete or dominant mass palpable; right axilla is free of adenopathy; left breast shows no surgical wounds; left nipple areolar complex is normal; breast parenchyma is dense and diffusely nodular without discrete or dominant mass; left axilla is free of adenopathy EXT:  non-tender without edema; no deformity NEURO: no gross focal deficits; no sign of tremor   LABORATORY  RESULTS: See [facility] (CHL-Epic) for most recent results  RADIOLOGY RESULTS: See [facility] (CHL-Epic) for most recent results  IMPRESSION: #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 We discussed her physical examination. I recommended continued close observation with monthly self-examination. There is no evidence of malignancy. Radiographic findings appear benign. Breast tenderness is likely related to underlying fibrocystic change.  I have recommended that the patient undergo short interval followup with radiology for bilateral mammography and right breast ultrasound. I will be happy to see her for physical examination and following those studies. We will make arrangements for her radiographic studies and her clinical evaluation in May 2015.  Velora Heckler, MD, FACS General & Endocrine Surgery [facility]  Primary Care Physician: No primary provider on file.

## 2013-11-18 NOTE — Patient Instructions (Signed)

## 2014-01-05 IMAGING — CR DG CHEST 2V
1 series · 2 of 2 positions shown · non-contrast
Comparison: none

REASON FOR EXAM: cough
COMMENTS:

[Series 1: w chest pa · 0.14mm/px · 2 of 2 slices shown]
[im 1/2]
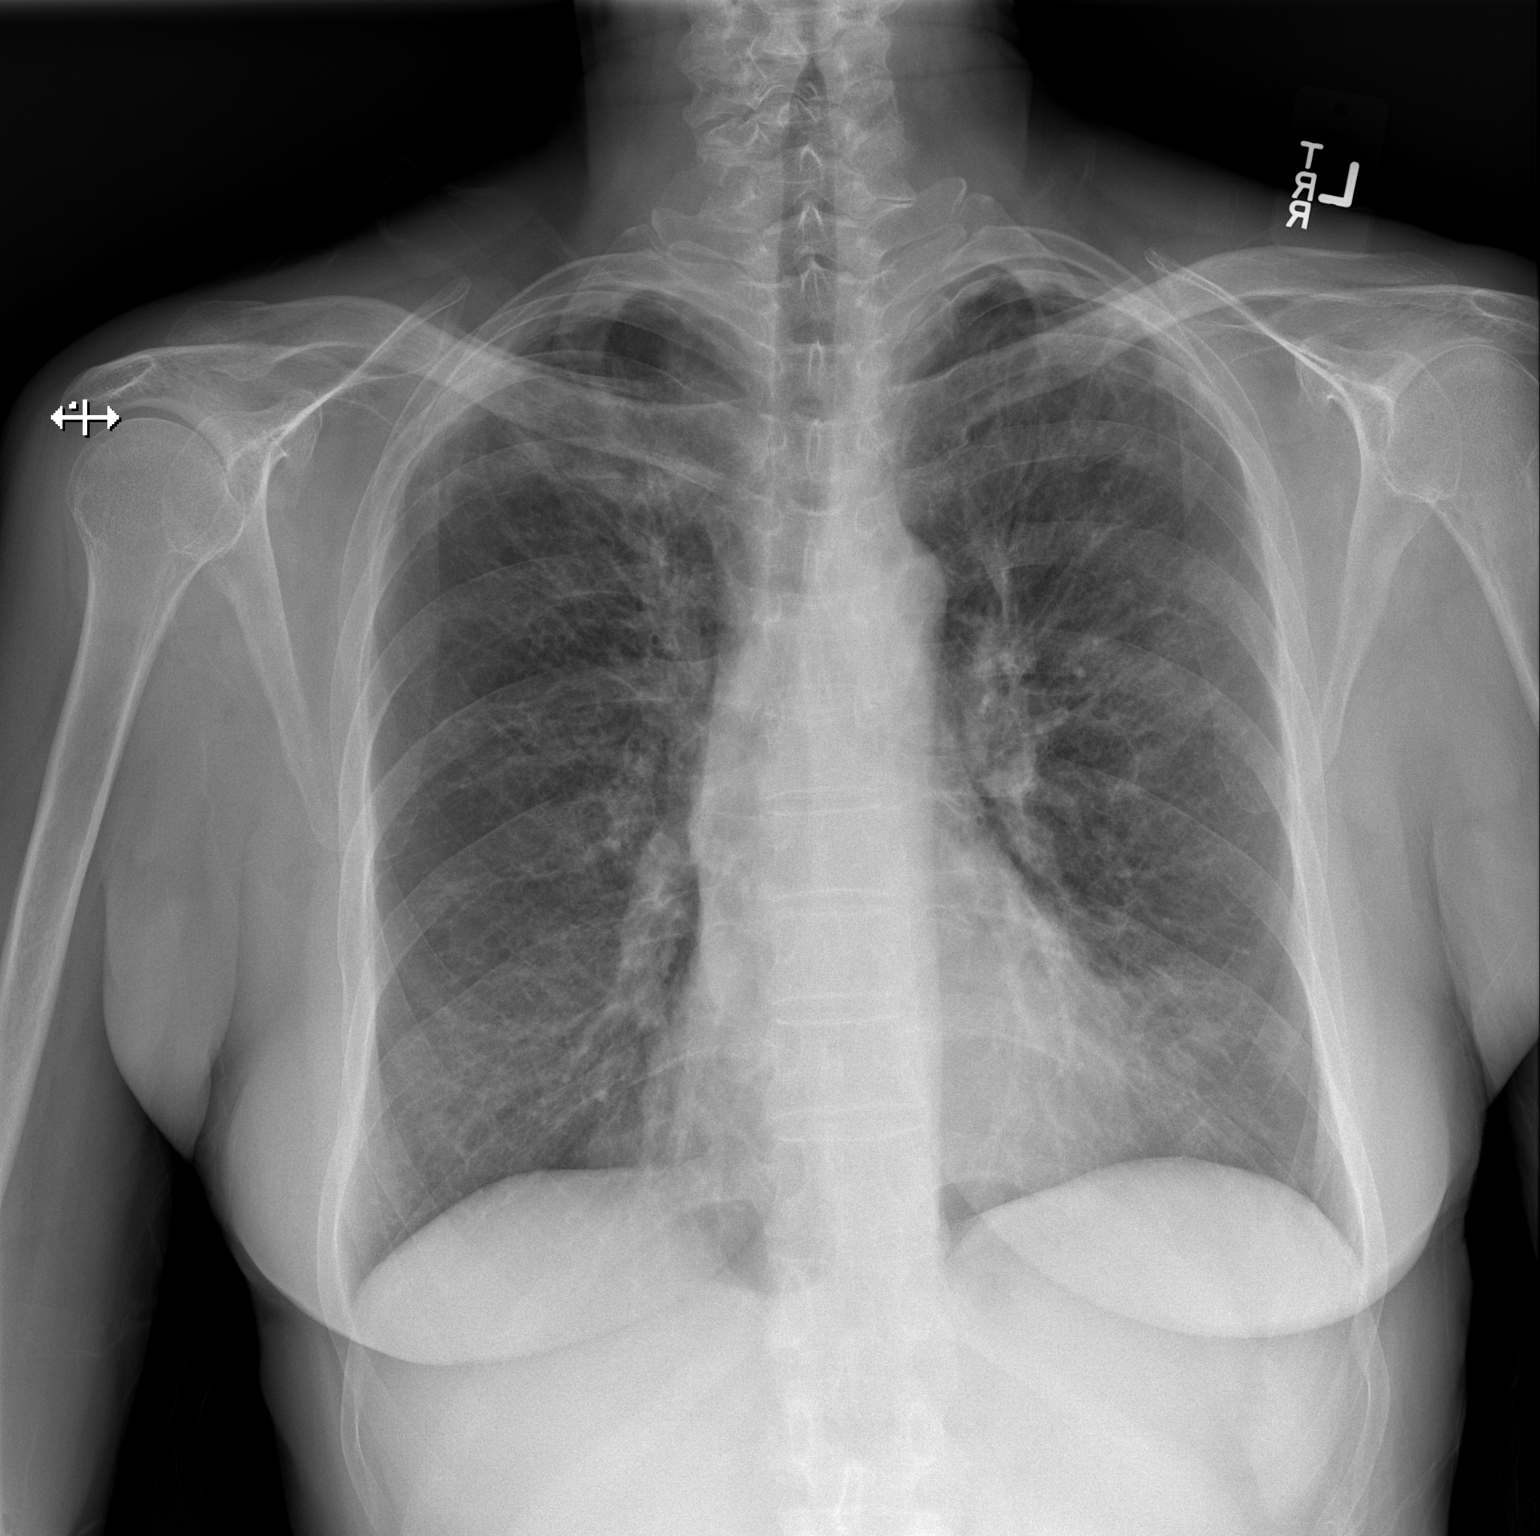
[im 2/2]
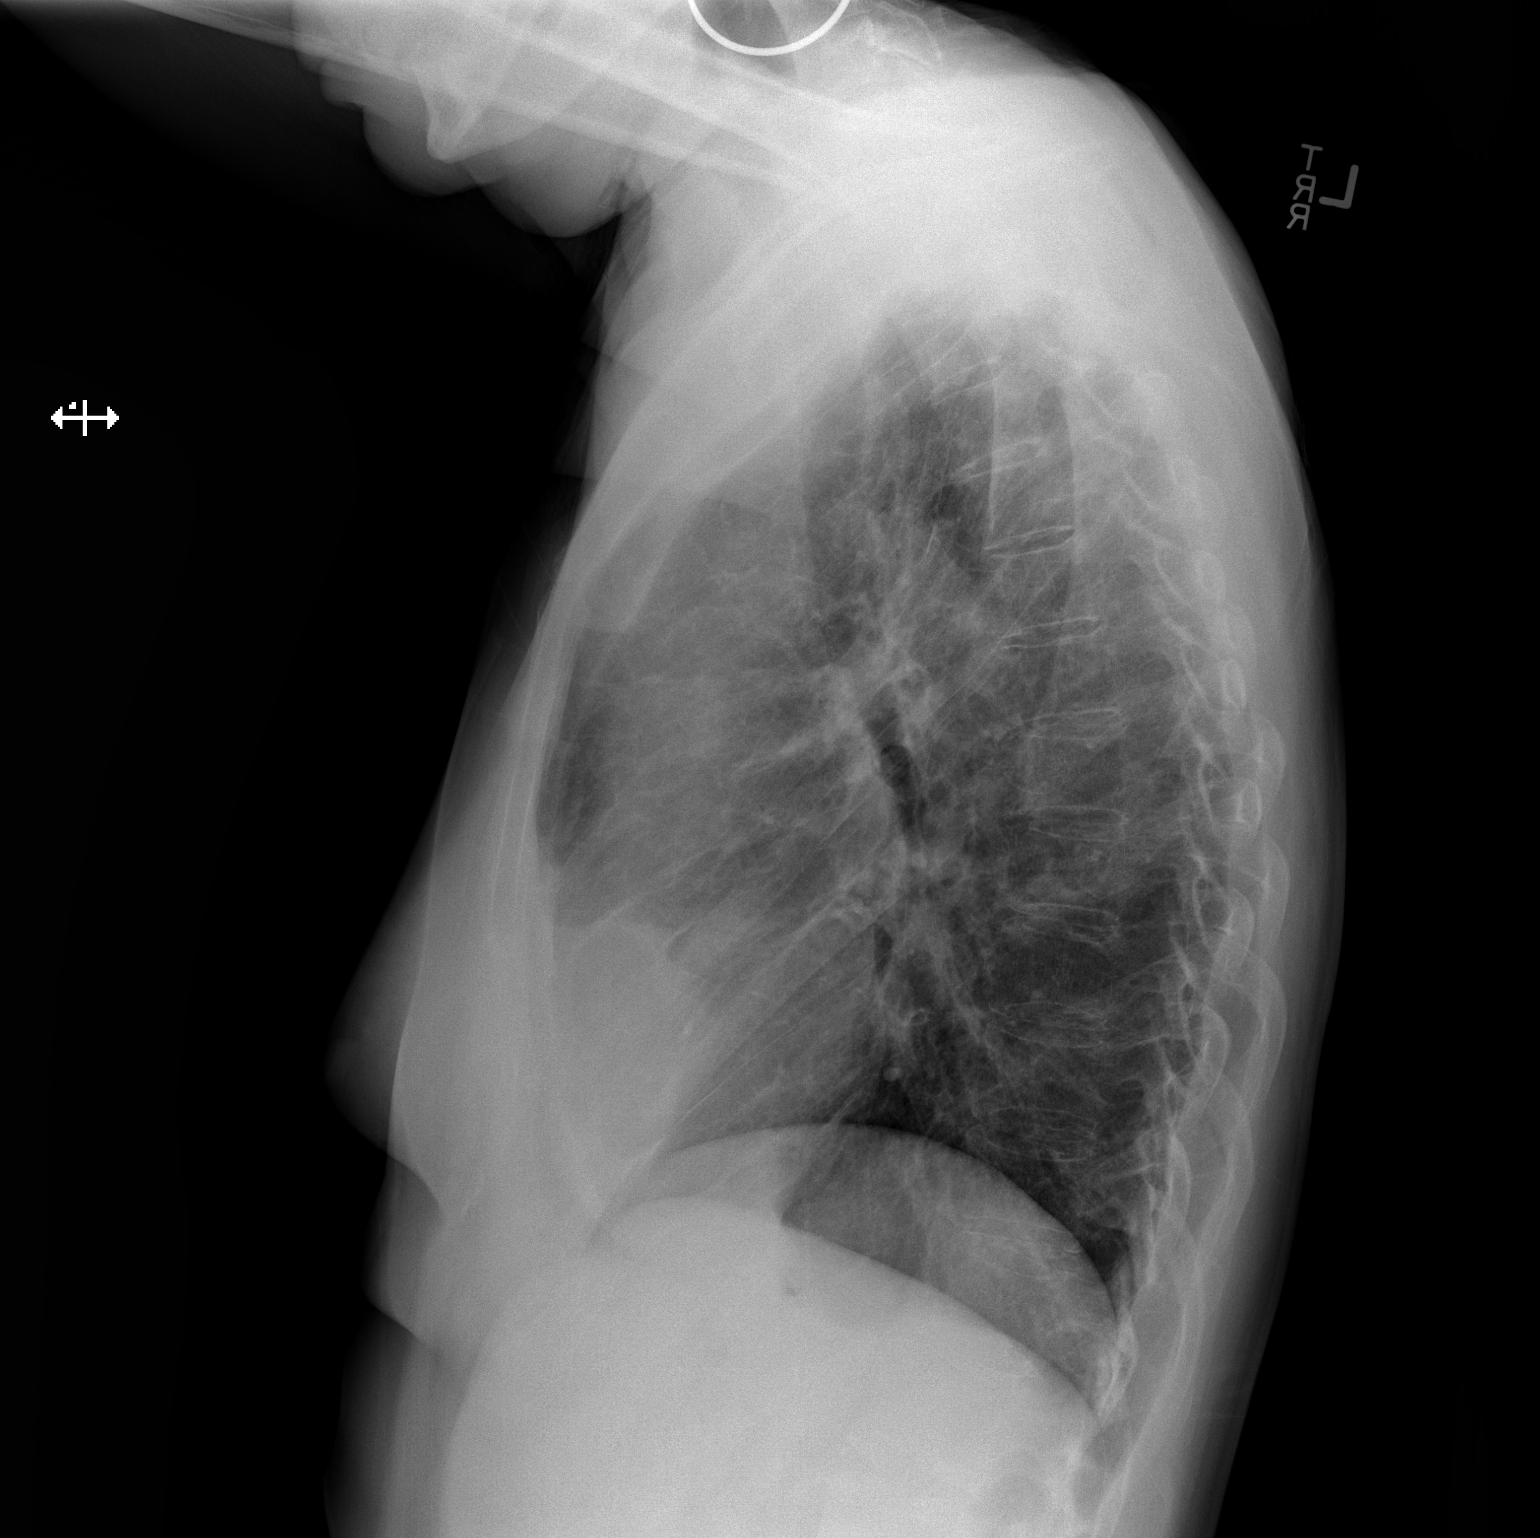

[2 of 2 positions shown; findings below may reference images not displayed]

PROCEDURE:     DXR - DXR CHEST PA (OR AP) AND LATERAL  - September 18, 2012 [DATE]

RESULT:     Comparison is made to the study June 11, 2011.

The lungs are mildly hyperinflated. The interstitial markings are increased.
Confluent alveolar density is present in the right pulmonary apex. The
cardiac silhouette is normal in size. The pulmonary vascularity is not
engorged. The mediastinum is normal in width. There is no pleural effusion
or pneumothorax.
IMPRESSION: There are diffusely increased interstitial markings
suggesting interstitial edema or interstitial pneumonia. Confluent alveolar
density in the right upper hemithorax is worrisome for pneumonia. Followup
films following therapy are recommended to assure clearing.

[REDACTED]

## 2014-03-04 ENCOUNTER — Ambulatory Visit
Admission: RE | Admit: 2014-03-04 | Discharge: 2014-03-04 | Disposition: A | Discharge status: Home / Self Care | Payer: Medicare Other | Source: Ambulatory Visit | Attending: Nurse Practitioner | Admitting: Nurse Practitioner

## 2014-03-04 ENCOUNTER — Other Ambulatory Visit: Payer: Self-pay | Admitting: Nurse Practitioner

## 2014-03-04 DIAGNOSIS — M25569 Pain in unspecified knee: Secondary | ICD-10-CM

## 2014-04-01 ENCOUNTER — Other Ambulatory Visit: Payer: Self-pay | Admitting: Nurse Practitioner

## 2014-04-01 DIAGNOSIS — D249 Benign neoplasm of unspecified breast: Secondary | ICD-10-CM

## 2014-04-01 DIAGNOSIS — N6310 Unspecified lump in the right breast, unspecified quadrant: Secondary | ICD-10-CM

## 2014-04-19 ENCOUNTER — Ambulatory Visit
Admission: RE | Admit: 2014-04-19 | Discharge: 2014-04-19 | Disposition: A | Discharge status: Home / Self Care | Payer: Medicare Other | Source: Ambulatory Visit | Attending: Nurse Practitioner | Admitting: Nurse Practitioner

## 2014-04-19 ENCOUNTER — Encounter (INDEPENDENT_AMBULATORY_CARE_PROVIDER_SITE_OTHER): Payer: Self-pay

## 2014-04-19 DIAGNOSIS — N6310 Unspecified lump in the right breast, unspecified quadrant: Secondary | ICD-10-CM

## 2014-04-19 DIAGNOSIS — D249 Benign neoplasm of unspecified breast: Secondary | ICD-10-CM

## 2014-05-26 ENCOUNTER — Encounter (HOSPITAL_COMMUNITY): Payer: Self-pay | Admitting: Emergency Medicine

## 2014-05-26 ENCOUNTER — Emergency Department (INDEPENDENT_AMBULATORY_CARE_PROVIDER_SITE_OTHER)
Admission: EM | Admit: 2014-05-26 | Discharge: 2014-05-26 | Disposition: A | Discharge status: Home / Self Care | Payer: Medicare Other | Source: Home / Self Care

## 2014-05-26 ENCOUNTER — Emergency Department (INDEPENDENT_AMBULATORY_CARE_PROVIDER_SITE_OTHER): Payer: Medicare Other

## 2014-05-26 DIAGNOSIS — M129 Arthropathy, unspecified: Secondary | ICD-10-CM

## 2014-05-26 DIAGNOSIS — M1990 Unspecified osteoarthritis, unspecified site: Secondary | ICD-10-CM

## 2014-05-26 DIAGNOSIS — M25569 Pain in unspecified knee: Secondary | ICD-10-CM

## 2014-05-26 MED ORDER — KETOROLAC TROMETHAMINE 60 MG/2ML IM SOLN
60.0000 mg | Freq: Once | INTRAMUSCULAR | Status: AC
  Administered 2014-05-26: 60 mg via INTRAMUSCULAR

## 2014-05-26 MED ORDER — KETOROLAC TROMETHAMINE 60 MG/2ML IM SOLN
INTRAMUSCULAR | Status: AC
  Filled 2014-05-26: qty 2

## 2014-05-26 MED ORDER — DICLOFENAC SODIUM 3 % TD GEL: TRANSDERMAL | Status: DC

## 2014-05-26 NOTE — ED Notes (Signed)
Left knee pain, pain for one week, unknown injury.  Patient has had knee surgeries before

## 2014-05-26 NOTE — ED Notes (Signed)
Patient transported to X-ray 

## 2014-05-26 NOTE — Discharge Instructions (Signed)
Your knee pain is likely due to worsening arthritis Please start the voltaren gel for this pain and follow up with your regular doctor sometime in the next 1-2 weeks Please start the exercises to strengthen the muscles around the knee Please come back with any further questions or concerns.  Knee Exercises EXERCISES RANGE OF MOTION(ROM) AND STRETCHING EXERCISES These exercises may help you when beginning to rehabilitate your injury. Your symptoms may resolve with or without further involvement from your physician, physical therapist or athletic trainer. While completing these exercises, remember:   Restoring tissue flexibility helps normal motion to return to the joints. This allows healthier, less painful movement and activity.  An effective stretch should be held for at least 30 seconds.  A stretch should never be painful. You should only feel a gentle lengthening or release in the stretched tissue. STRETCH - Knee Extension, Prone  Lie on your stomach on a firm surface, such as a bed or countertop. Place your right / left knee and leg just beyond the edge of the surface. You may wish to place a towel under the far end of your right / left thigh for comfort.  Relax your leg muscles and allow gravity to straighten your knee. Your clinician may advise you to add an ankle weight if more resistance is helpful for you.  You should feel a stretch in the back of your right / left knee. Hold this position for __________ seconds. Repeat __________ times. Complete this stretch __________ times per day. * Your physician, physical therapist or athletic trainer may ask you to add ankle weight to enhance your stretch.  RANGE OF MOTION - Knee Flexion, Active  Lie on your back with both knees straight. (If this causes back discomfort, bend your opposite knee, placing your foot flat on the floor.)  Slowly slide your heel back toward your buttocks until you feel a gentle stretch in the front of your knee  or thigh.  Hold for __________ seconds. Slowly slide your heel back to the starting position. Repeat __________ times. Complete this exercise __________ times per day.  STRETCH - Quadriceps, Prone   Lie on your stomach on a firm surface, such as a bed or padded floor.  Bend your right / left knee and grasp your ankle. If you are unable to reach, your ankle or pant leg, use a belt around your foot to lengthen your reach.  Gently pull your heel toward your buttocks. Your knee should not slide out to the side. You should feel a stretch in the front of your thigh and/or knee.  Hold this position for __________ seconds. Repeat __________ times. Complete this stretch __________ times per day.  STRETCH  Hamstrings, Supine   Lie on your back. Loop a belt or towel over the ball of your right / left foot.  Straighten your right / left knee and slowly pull on the belt to raise your leg. Do not allow the right / left knee to bend. Keep your opposite leg flat on the floor.  Raise the leg until you feel a gentle stretch behind your right / left knee or thigh. Hold this position for __________ seconds. Repeat __________ times. Complete this stretch __________ times per day.  STRENGTHENING EXERCISES These exercises may help you when beginning to rehabilitate your injury. They may resolve your symptoms with or without further involvement from your physician, physical therapist or athletic trainer. While completing these exercises, remember:   Muscles can gain both the endurance  and the strength needed for everyday activities through controlled exercises.  Complete these exercises as instructed by your physician, physical therapist or athletic trainer. Progress the resistance and repetitions only as guided.  You may experience muscle soreness or fatigue, but the pain or discomfort you are trying to eliminate should never worsen during these exercises. If this pain does worsen, stop and make certain you  are following the directions exactly. If the pain is still present after adjustments, discontinue the exercise until you can discuss the trouble with your clinician. STRENGTH - Quadriceps, Isometrics  Lie on your back with your right / left leg extended and your opposite knee bent.  Gradually tense the muscles in the front of your right / left thigh. You should see either your knee cap slide up toward your hip or increased dimpling just above the knee. This motion will push the back of the knee down toward the floor/mat/bed on which you are lying.  Hold the muscle as tight as you can without increasing your pain for __________ seconds.  Relax the muscles slowly and completely in between each repetition. Repeat __________ times. Complete this exercise __________ times per day.  STRENGTH - Quadriceps, Short Arcs   Lie on your back. Place a __________ inch towel roll under your knee so that the knee slightly bends.  Raise only your lower leg by tightening the muscles in the front of your thigh. Do not allow your thigh to rise.  Hold this position for __________ seconds. Repeat __________ times. Complete this exercise __________ times per day.  OPTIONAL ANKLE WEIGHTS: Begin with ____________________, but DO NOT exceed ____________________. Increase in 1 pound/0.5 kilogram increments.  STRENGTH - Quadriceps, Straight Leg Raises  Quality counts! Watch for signs that the quadriceps muscle is working to insure you are strengthening the correct muscles and not "cheating" by substituting with healthier muscles.  Lay on your back with your right / left leg extended and your opposite knee bent.  Tense the muscles in the front of your right / left thigh. You should see either your knee cap slide up or increased dimpling just above the knee. Your thigh may even quiver.  Tighten these muscles even more and raise your leg 4 to 6 inches off the floor. Hold for __________ seconds.  Keeping these muscles  tense, lower your leg.  Relax the muscles slowly and completely in between each repetition. Repeat __________ times. Complete this exercise __________ times per day.  STRENGTH - Hamstring, Curls  Lay on your stomach with your legs extended. (If you lay on a bed, your feet may hang over the edge.)  Tighten the muscles in the back of your thigh to bend your right / left knee up to 90 degrees. Keep your hips flat on the bed/floor.  Hold this position for __________ seconds.  Slowly lower your leg back to the starting position. Repeat __________ times. Complete this exercise __________ times per day.  OPTIONAL ANKLE WEIGHTS: Begin with ____________________, but DO NOT exceed ____________________. Increase in 1 pound/0.5 kilogram increments.  STRENGTH  Quadriceps, Squats  Stand in a door frame so that your feet and knees are in line with the frame.  Use your hands for balance, not support, on the frame.  Slowly lower your weight, bending at the hips and knees. Keep your lower legs upright so that they are parallel with the door frame. Squat only within the range that does not increase your knee pain. Never let your hips drop below  your knees.  Slowly return upright, pushing with your legs, not pulling with your hands. Repeat __________ times. Complete this exercise __________ times per day.  STRENGTH - Quadriceps, Wall Slides  Follow guidelines for form closely. Increased knee pain often results from poorly placed feet or knees.  Lean against a smooth wall or door and walk your feet out 18-24 inches. Place your feet hip-width apart.  Slowly slide down the wall or door until your knees bend __________ degrees.* Keep your knees over your heels, not your toes, and in line with your hips, not falling to either side.  Hold for __________ seconds. Stand up to rest for __________ seconds in between each repetition. Repeat __________ times. Complete this exercise __________ times per day. * Your  physician, physical therapist or athletic trainer will alter this angle based on your symptoms and progress. Document Released: 10/17/2005 Document Revised: 02/25/2012 Document Reviewed: 03/17/2009 [facility] Patient Information 2014 [place], [place].

## 2014-05-26 NOTE — ED Provider Notes (Signed)
CSN: [id number]     Arrival date & time 05/26/14  0941 History   None    Chief Complaint  Patient presents with  . Knee Pain   (Consider location/radiation/quality/duration/timing/severity/associated sxs/prior Treatment) HPI  Knee pain: started 7 days ago. L knee. Acutely worsened yesterday after busy day on feet. Constant burnign sensation and occasional shooting pain. Minimal radiation down L leg. Now swelling. 7 years ago pt had 2 knee arthroscopy procedures for torn meniscus. No recent trauma. Takes tramadol w/o benefit. Tylenol 1g 4x daily.     Past Medical History  Diagnosis Date  . Allergy   . Anemia   . Anxiety   . Depression   . Neuromuscular disorder   . Hypertension   . Arthritis    Past Surgical History  Procedure Laterality Date  . Appendectomy    . Breast surgery    . Tubal ligation    . Abdominal hysterectomy    . Knee surgery  2005    2 times left   Family History  Problem Relation Age of Onset  . Cancer Mother     breast and ovarian  . Hypertension Father   . Hyperlipidemia Father   . Cancer Maternal Grandmother     breast  . Cancer Paternal Grandmother   . Cancer Paternal Grandfather   . Cancer Maternal Aunt     breast  . Cancer Maternal Aunt     breast   History  Substance Use Topics  . Smoking status: Former Smoker    Quit date: 03/17/2013  . Smokeless tobacco: Never Used  . Alcohol Use: No   OB History   Grav Para Term Preterm Abortions TAB SAB Ect Mult Living                 Review of Systems  Musculoskeletal: Positive for arthralgias.  All other systems reviewed and are negative.   Allergies  Demerol  Home Medications   Prior to Admission medications   Medication Sig Start Date End Date Taking? Authorizing Provider  ARIPiprazole (ABILIFY) 10 MG tablet Take 10 mg by mouth daily.   Yes Historical Provider, MD  HYDROCHLOROTHIAZIDE PO Take 10 mg/day by mouth.   Yes Historical Provider, MD  TRAMADOL HCL ER PO Take 10 mg by  mouth.   Yes Historical Provider, MD  Diclofenac Sodium 3 % GEL Apply to affected area up to 4 times daily 05/26/14   Waldemar Dickens, MD  DULoxetine (CYMBALTA) 20 MG capsule Take 20 mg by mouth 2 (two) times daily.    Historical Provider, MD  gabapentin (NEURONTIN) 100 MG capsule Take 100 mg by mouth 3 (three) times daily.    Historical Provider, MD  traZODone (DESYREL) 100 MG tablet Take 200 mg by mouth at bedtime.    Historical Provider, MD   BP 138/92  Pulse 67  Temp(Src) 97.6 F (36.4 C) (Oral)  Resp 16  SpO2 97% Physical Exam  Constitutional: She appears well-developed and well-nourished. No distress.  HENT:  Head: Normocephalic and atraumatic.  Eyes: Pupils are equal, round, and reactive to light.  Neck: Normal range of motion.  Pulmonary/Chest: Effort normal. No respiratory distress.  Abdominal: She exhibits no distension.  Musculoskeletal:  Knees FROM. L medial knee joint ttp w/ minimal swelling. Crepitus bilat. Lachman's negative bilat. Varrus stress w/ reproducible pain on L  Neurological: She is alert. She exhibits normal muscle tone.  Skin: Skin is warm. She is not diaphoretic.  Psychiatric: She has a normal mood  and affect. Judgment and thought content normal.    ED Course  Procedures (including critical care time) Labs Review Labs Reviewed - No data to display  Imaging Review No results found.   MDM   1. Knee pain   2. Arthritis    Pt w/ significant previous operative knee pathology. Now likely w/ continued meniscal deterioration and injury. Toradol given in office (pt on ppi but states this is well controlled, and renal fxn nml). Rx for voltaren gel given. Start home exercises. Continue home nortriptyline and gabapentin, and tylenol. - precautions given and all questions answered  Linna Darner, MD Family Medicine PGY-3 05/26/2014, 11:49 AM      Waldemar Dickens, MD 05/26/14 1149

## 2014-05-27 NOTE — ED Provider Notes (Signed)
Medical screening examination/treatment/procedure(s) were performed by a resident physician or non-physician practitioner and as the supervising physician I was immediately available for consultation/collaboration.  Filiberto Wamble, MD    Zhoe Catania S Briget Shaheed, MD 05/27/14 0751 

## 2014-07-22 ENCOUNTER — Other Ambulatory Visit: Payer: Self-pay | Admitting: Nurse Practitioner

## 2014-07-22 DIAGNOSIS — N63 Unspecified lump in unspecified breast: Secondary | ICD-10-CM

## 2014-08-15 IMAGING — CR DG LUMBAR SPINE 2-3V
2 series · 2 of 2 positions shown · non-contrast
Comparison: None.

CLINICAL DATA: Back pain

LUMBAR SPINE - 2-3 VIEW

[AP]
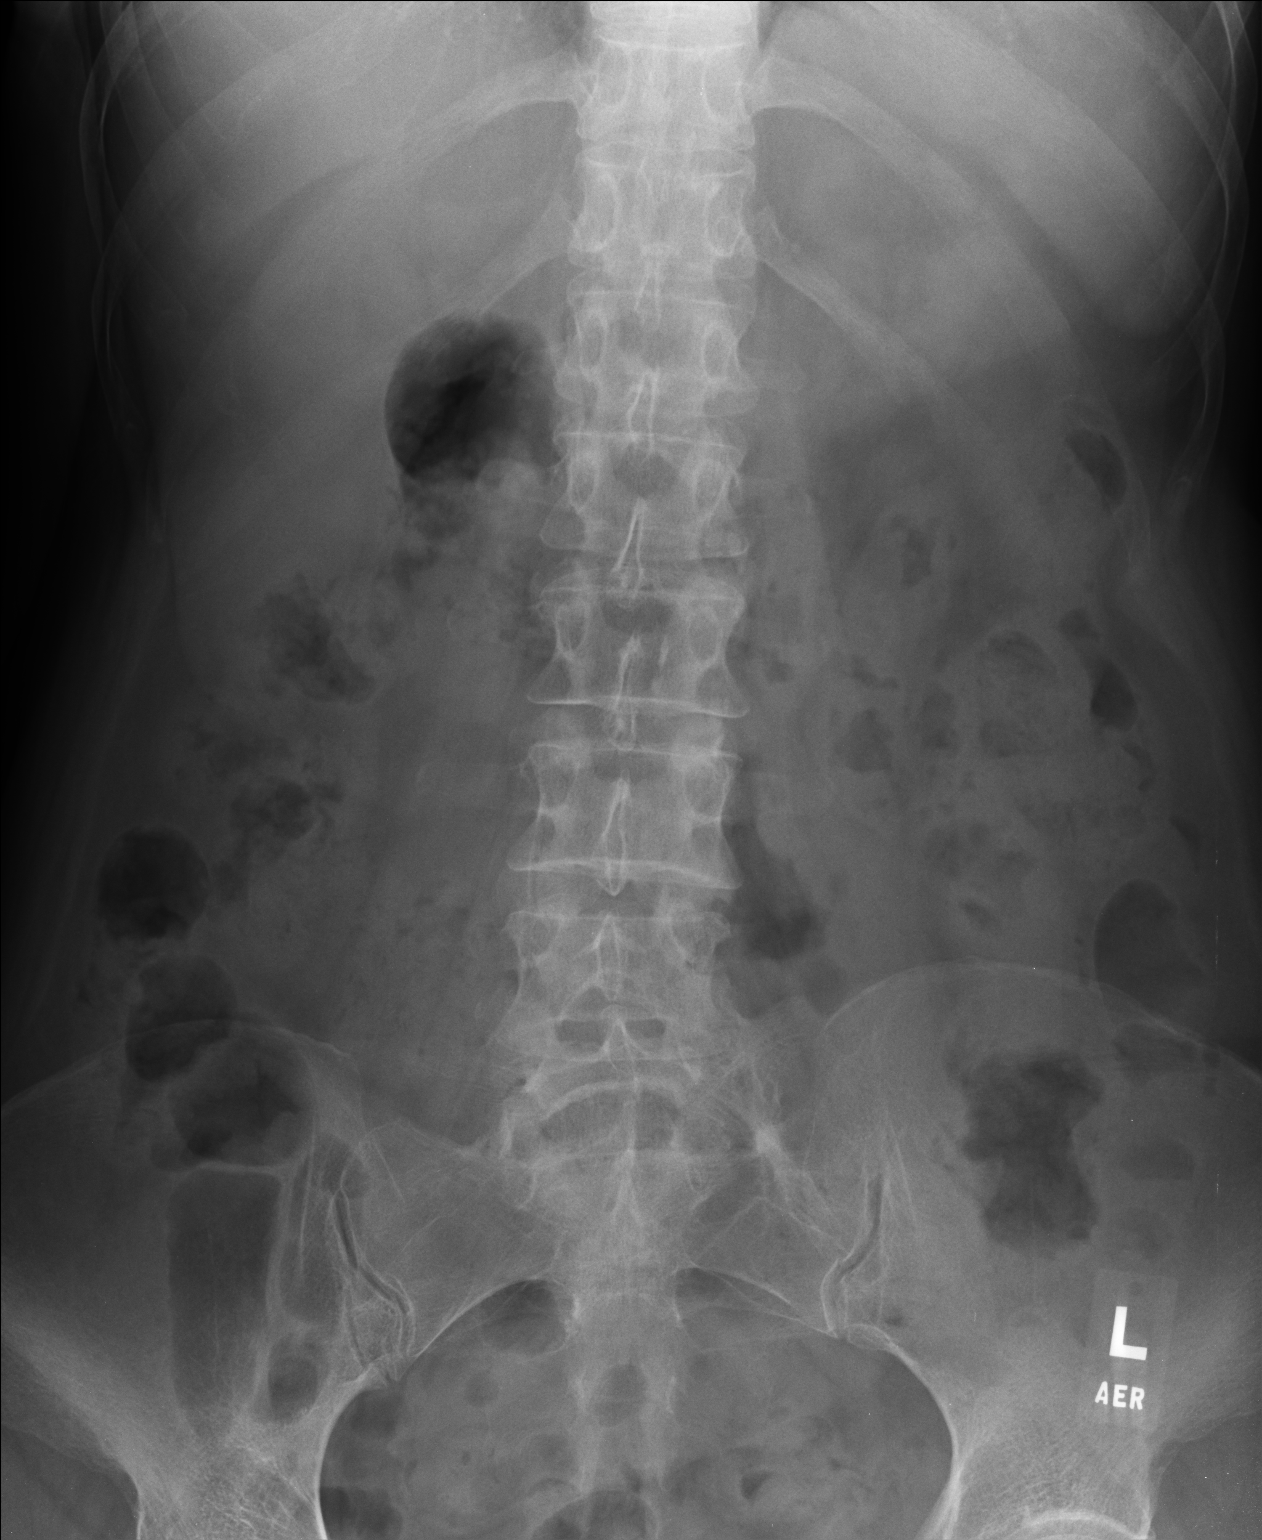

[lateral]
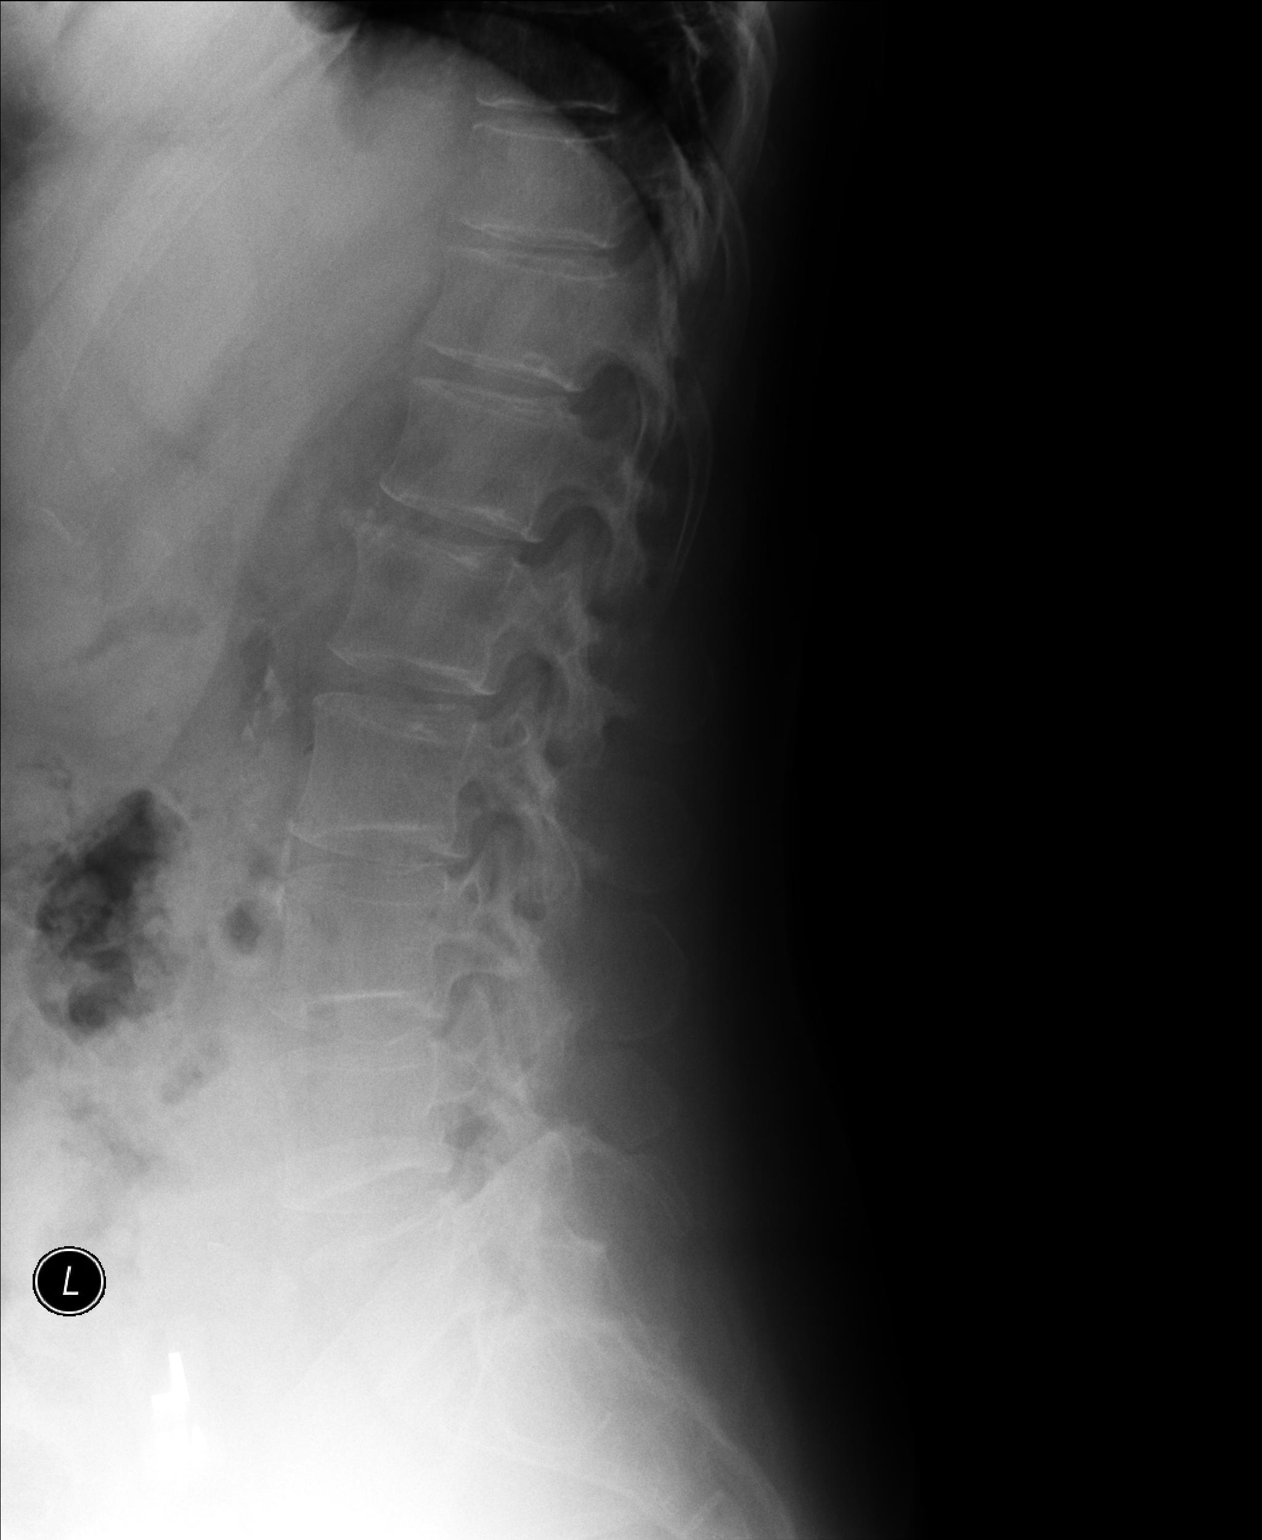

[2 of 2 positions shown; findings below may reference images not displayed]

FINDINGS: There are five non-rib bearing lumbar type vertebral bodies with
possible lumbarization of the S1 vertebral body.  For the purposes
of this dictation, lumbar vertebral body levels will be labeled L1-
L5.

There is a mild scoliotic curvature of the thoracolumbar spine with
inferior curvature convex to the right, possibly positional.  No
definite anterolisthesis or retrolisthesis.

Lumbar vertebral body heights appear preserved.  There is mild
multilevel lumbar spine DDD, likely worse at L1 - L2 with disc
space height loss, end plate irregularity and sclerosis.

Atherosclerotic calcifications within the abdominal aorta.

Nonobstructive bowel gas pattern.  Regional soft tissues are
normal.
IMPRESSION: 1.  Mild multilevel DDD, likely worse at L1 - L2.
2.  Possible transitional anatomy as above.

## 2014-08-17 ENCOUNTER — Encounter (INDEPENDENT_AMBULATORY_CARE_PROVIDER_SITE_OTHER): Payer: Self-pay

## 2014-08-17 ENCOUNTER — Other Ambulatory Visit: Payer: Medicare Other

## 2014-08-17 ENCOUNTER — Ambulatory Visit
Admission: RE | Admit: 2014-08-17 | Discharge: 2014-08-17 | Disposition: A | Discharge status: Home / Self Care | Payer: Medicare Other | Source: Ambulatory Visit | Attending: Nurse Practitioner | Admitting: Nurse Practitioner

## 2014-08-17 DIAGNOSIS — N63 Unspecified lump in unspecified breast: Secondary | ICD-10-CM

## 2014-08-28 IMAGING — CR RIGHT FOOT COMPLETE - 3+ VIEW
1 series · 2 of 2 positions shown · non-contrast
Comparison: none

REASON FOR EXAM: pain/swelling 2nd/3rd digit
COMMENTS:   May transport without cardiac monitor

PROCEDURE:     DXR - DXR FOOT RT COMPLETE W/OBLIQUES  - May 11, 2013  [DATE]
RESULT:     No acute bony or joint abnormality.

[Series 1: ap · 0.17mm/px · 2 of 2 slices shown]
[im 1/2]
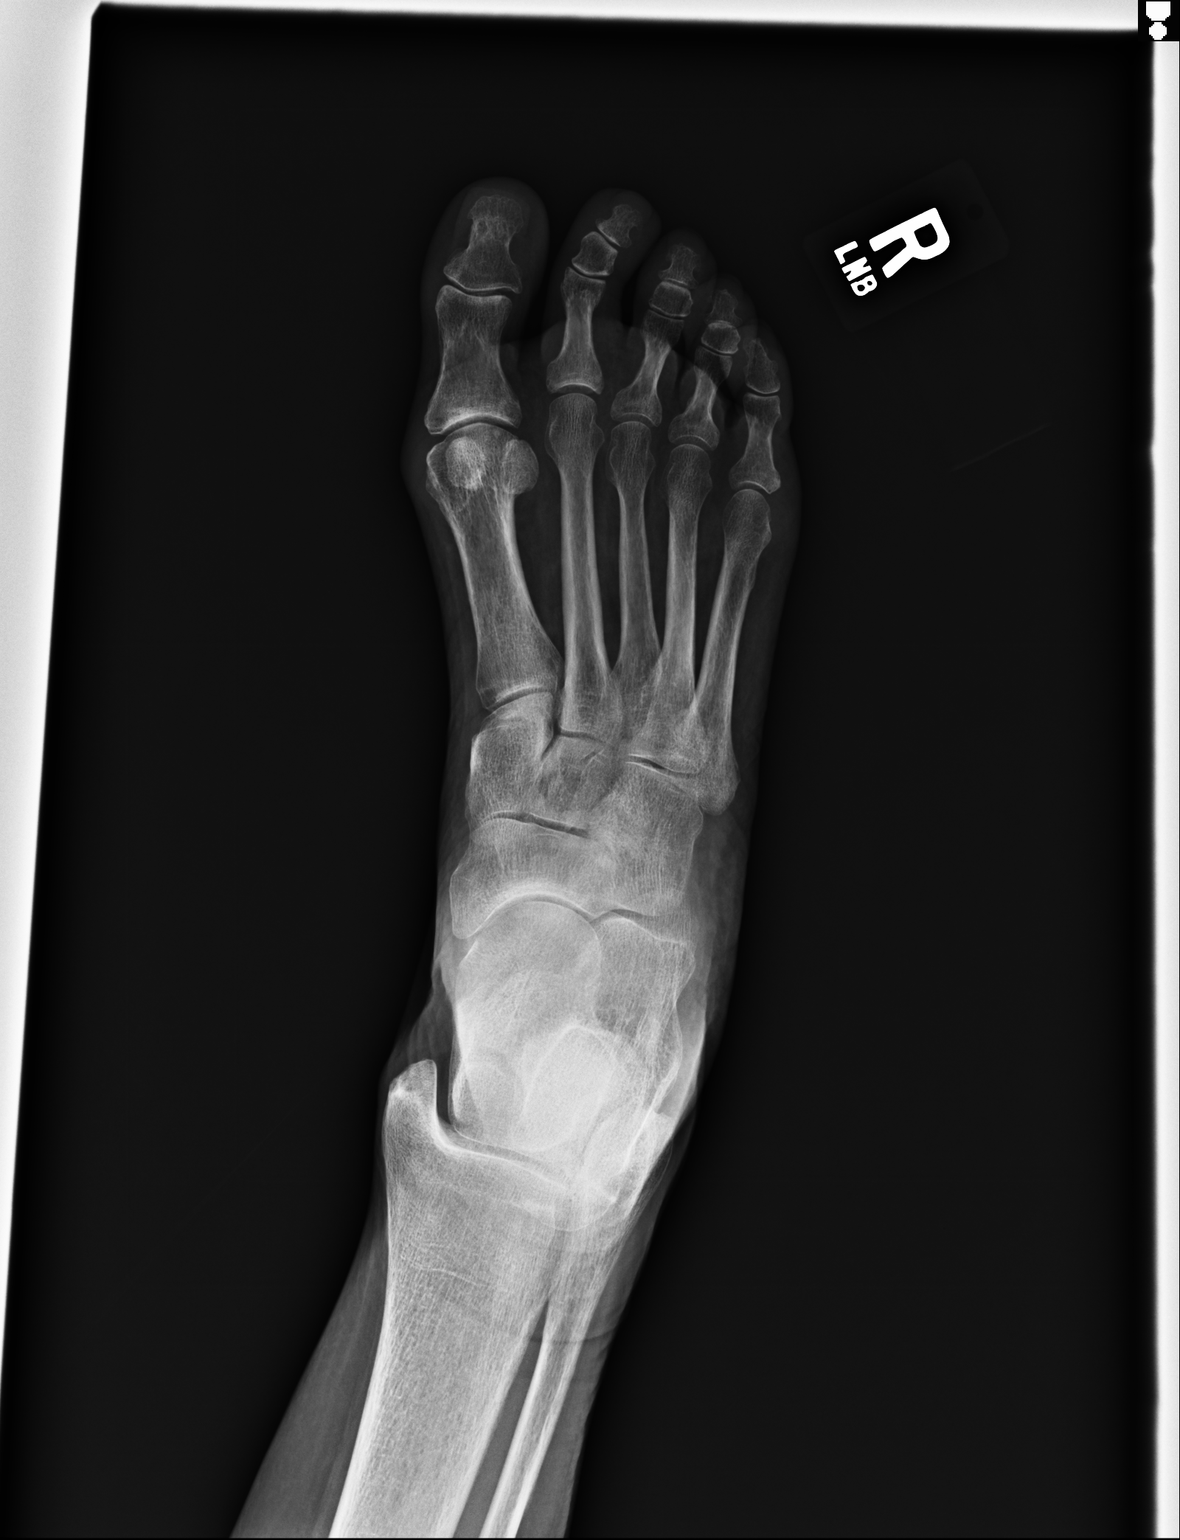
[im 2/2]
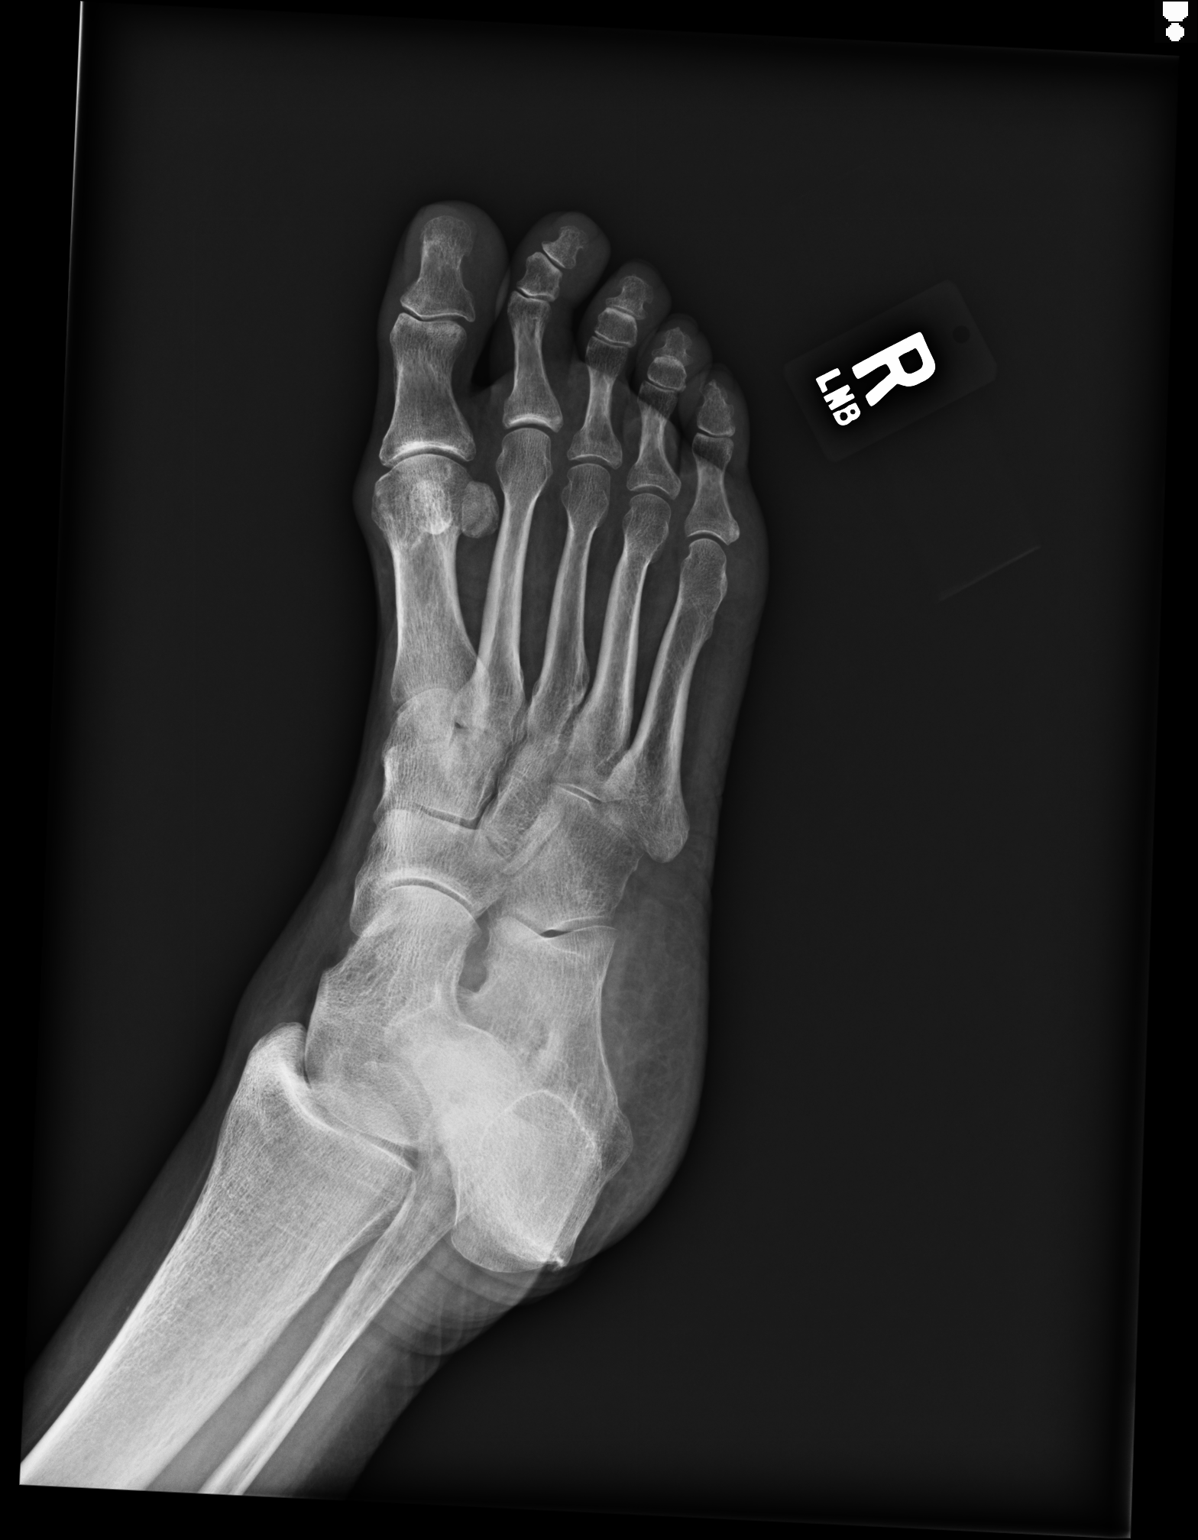

[2 of 2 positions shown; findings below may reference images not displayed]

IMPRESSION: No acute abnormality.

## 2014-08-28 IMAGING — CR RIGHT FOOT COMPLETE - 3+ VIEW
1 series · 1 of 1 positions shown · non-contrast
Comparison: none

REASON FOR EXAM: pain/swelling 2nd/3rd digit
COMMENTS:   May transport without cardiac monitor

PROCEDURE:     DXR - DXR FOOT RT COMPLETE W/OBLIQUES  - May 11, 2013  [DATE]
RESULT:     No acute bony or joint abnormality.

[lat]
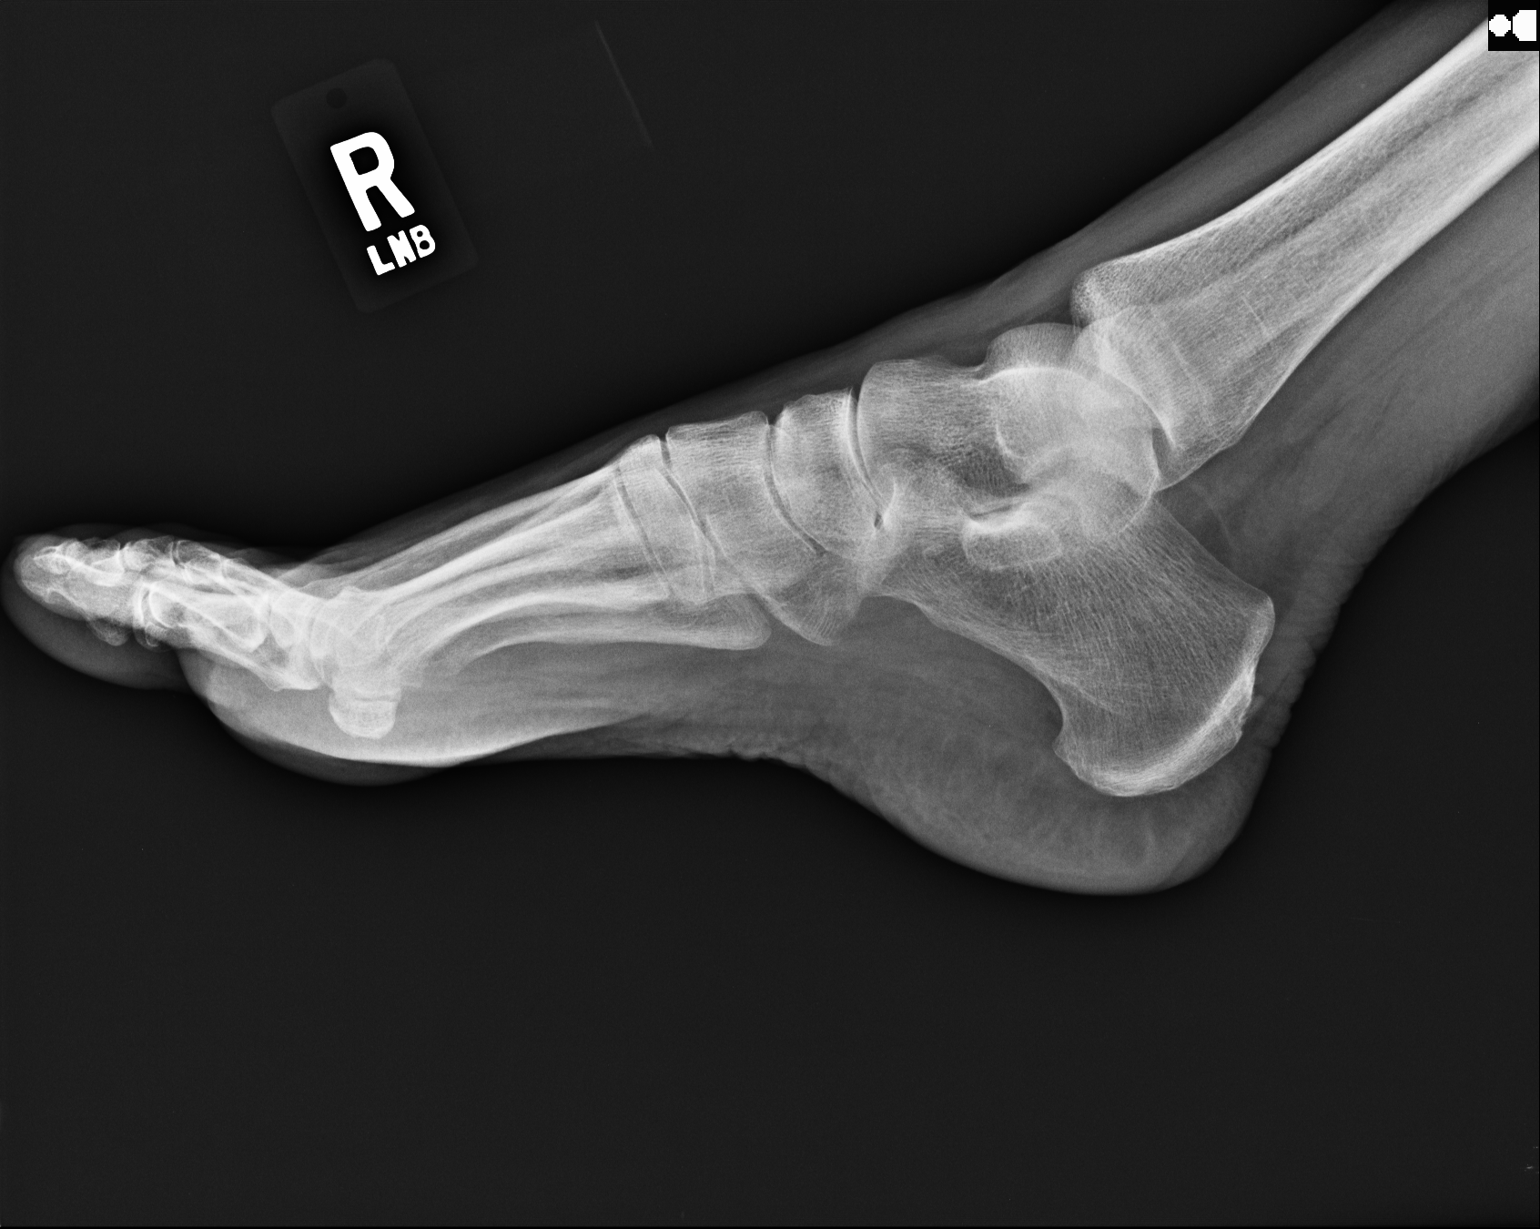

[1 of 1 positions shown; findings below may reference images not displayed]

IMPRESSION: No acute abnormality.

## 2014-12-11 ENCOUNTER — Emergency Department (HOSPITAL_COMMUNITY)
Admission: EM | Admit: 2014-12-11 | Discharge: 2014-12-11 | Disposition: A | Discharge status: Home / Self Care | Payer: Medicare Other | Attending: Emergency Medicine | Admitting: Emergency Medicine

## 2014-12-11 ENCOUNTER — Encounter (HOSPITAL_COMMUNITY): Payer: Self-pay | Admitting: Emergency Medicine

## 2014-12-11 ENCOUNTER — Emergency Department (HOSPITAL_COMMUNITY): Payer: Medicare Other

## 2014-12-11 DIAGNOSIS — F419 Anxiety disorder, unspecified: Secondary | ICD-10-CM | POA: Insufficient documentation

## 2014-12-11 DIAGNOSIS — F329 Major depressive disorder, single episode, unspecified: Secondary | ICD-10-CM | POA: Insufficient documentation

## 2014-12-11 DIAGNOSIS — J159 Unspecified bacterial pneumonia: Secondary | ICD-10-CM | POA: Insufficient documentation

## 2014-12-11 DIAGNOSIS — Z720 Tobacco use: Secondary | ICD-10-CM | POA: Insufficient documentation

## 2014-12-11 DIAGNOSIS — Z79899 Other long term (current) drug therapy: Secondary | ICD-10-CM | POA: Diagnosis not present

## 2014-12-11 DIAGNOSIS — Z862 Personal history of diseases of the blood and blood-forming organs and certain disorders involving the immune mechanism: Secondary | ICD-10-CM | POA: Diagnosis not present

## 2014-12-11 DIAGNOSIS — J189 Pneumonia, unspecified organism: Secondary | ICD-10-CM

## 2014-12-11 DIAGNOSIS — R079 Chest pain, unspecified: Secondary | ICD-10-CM | POA: Diagnosis not present

## 2014-12-11 DIAGNOSIS — R059 Cough, unspecified: Secondary | ICD-10-CM

## 2014-12-11 DIAGNOSIS — R05 Cough: Secondary | ICD-10-CM

## 2014-12-11 DIAGNOSIS — Z8669 Personal history of other diseases of the nervous system and sense organs: Secondary | ICD-10-CM | POA: Insufficient documentation

## 2014-12-11 DIAGNOSIS — I10 Essential (primary) hypertension: Secondary | ICD-10-CM | POA: Insufficient documentation

## 2014-12-11 DIAGNOSIS — M1990 Unspecified osteoarthritis, unspecified site: Secondary | ICD-10-CM | POA: Diagnosis not present

## 2014-12-11 LAB — CBC WITH DIFFERENTIAL/PLATELET
Basophils Absolute: 0 10*3/uL (ref 0.0–0.1)
Basophils Relative: 0 % (ref 0–1)
Eosinophils Absolute: 0.1 10*3/uL (ref 0.0–0.7)
Eosinophils Relative: 1 % (ref 0–5)
HCT: 35.6 % — ABNORMAL LOW (ref 36.0–46.0)
Hemoglobin: 11.3 g/dL — ABNORMAL LOW (ref 12.0–15.0)
Lymphocytes Relative: 18 % (ref 12–46)
Lymphs Abs: 2.7 10*3/uL (ref 0.7–4.0)
MCH: 27.3 pg (ref 26.0–34.0)
MCHC: 31.7 g/dL (ref 30.0–36.0)
MCV: 86 fL (ref 78.0–100.0)
Monocytes Absolute: 0.6 10*3/uL (ref 0.1–1.0)
Monocytes Relative: 4 % (ref 3–12)
Neutro Abs: 11.3 10*3/uL — ABNORMAL HIGH (ref 1.7–7.7)
Neutrophils Relative %: 77 % (ref 43–77)
Platelets: 234 10*3/uL (ref 150–400)
RBC: 4.14 MIL/uL (ref 3.87–5.11)
RDW: 14.6 % (ref 11.5–15.5)
WBC: 14.7 10*3/uL — ABNORMAL HIGH (ref 4.0–10.5)

## 2014-12-11 LAB — URINALYSIS, ROUTINE W REFLEX MICROSCOPIC
Glucose, UA: NEGATIVE mg/dL
Ketones, ur: NEGATIVE mg/dL
Leukocytes, UA: NEGATIVE
Nitrite: NEGATIVE
PH: 6 (ref 5.0–8.0)
Protein, ur: NEGATIVE mg/dL
SPECIFIC GRAVITY, URINE: 1.031 — AB (ref 1.005–1.030)
Urobilinogen, UA: 1 mg/dL (ref 0.0–1.0)

## 2014-12-11 LAB — COMPREHENSIVE METABOLIC PANEL
ALBUMIN: 3.7 g/dL (ref 3.5–5.2)
ALK PHOS: 109 U/L (ref 39–117)
ALT: 8 U/L (ref 0–35)
ANION GAP: 9 (ref 5–15)
AST: 13 U/L (ref 0–37)
BILIRUBIN TOTAL: 0.5 mg/dL (ref 0.3–1.2)
BUN: 14 mg/dL (ref 6–23)
CHLORIDE: 104 meq/L (ref 96–112)
CO2: 29 mmol/L (ref 19–32)
Calcium: 8.8 mg/dL (ref 8.4–10.5)
Creatinine, Ser: 0.66 mg/dL (ref 0.50–1.10)
GFR calc non Af Amer: >90 mL/min (ref >90)
GLUCOSE: 122 mg/dL — AB (ref 70–99)
Potassium: 3.1 mmol/L — ABNORMAL LOW (ref 3.5–5.1)
Sodium: 142 mmol/L (ref 135–145)
Total Protein: 7 g/dL (ref 6.0–8.3)

## 2014-12-11 LAB — URINE MICROSCOPIC-ADD ON

## 2014-12-11 LAB — TROPONIN I: Troponin I: <0.03 ng/mL (ref <0.031)

## 2014-12-11 MED ORDER — LEVOFLOXACIN 500 MG PO TABS: 500.0000 mg | ORAL_TABLET | Freq: Every day | ORAL | Status: DC

## 2014-12-11 MED ORDER — ONDANSETRON HCL 4 MG/2ML IJ SOLN
4.0000 mg | Freq: Once | INTRAMUSCULAR | Status: AC
  Administered 2014-12-11: 4 mg via INTRAVENOUS
  Filled 2014-12-11: qty 2

## 2014-12-11 MED ORDER — MORPHINE SULFATE 4 MG/ML IJ SOLN
6.0000 mg | Freq: Once | INTRAMUSCULAR | Status: AC
  Administered 2014-12-11: 6 mg via INTRAVENOUS
  Filled 2014-12-11: qty 2

## 2014-12-11 MED ORDER — HYDROCODONE-ACETAMINOPHEN 5-325 MG PO TABS: 1.0000 | ORAL_TABLET | ORAL | Status: DC | PRN

## 2014-12-11 MED ORDER — ALBUTEROL SULFATE HFA 108 (90 BASE) MCG/ACT IN AERS: 2.0000 | INHALATION_SPRAY | RESPIRATORY_TRACT | Status: DC | PRN

## 2014-12-11 MED ORDER — LEVOFLOXACIN 500 MG PO TABS
500.0000 mg | ORAL_TABLET | Freq: Once | ORAL | Status: AC
  Administered 2014-12-11: 500 mg via ORAL
  Filled 2014-12-11: qty 1

## 2014-12-11 MED ORDER — ALBUTEROL SULFATE (2.5 MG/3ML) 0.083% IN NEBU
5.0000 mg | INHALATION_SOLUTION | Freq: Once | RESPIRATORY_TRACT | Status: DC
  Filled 2014-12-11: qty 6

## 2014-12-11 MED ORDER — ALBUTEROL SULFATE (2.5 MG/3ML) 0.083% IN NEBU
5.0000 mg | INHALATION_SOLUTION | Freq: Once | RESPIRATORY_TRACT | Status: AC
  Administered 2014-12-11: 5 mg via RESPIRATORY_TRACT
  Filled 2014-12-11: qty 6

## 2014-12-11 NOTE — ED Notes (Signed)
Patient transported to X-ray 

## 2014-12-11 NOTE — ED Notes (Signed)
Patient is visibly more SOB after ambulating O2 sats 94-95% on room air.

## 2014-12-11 NOTE — Discharge Instructions (Signed)

## 2014-12-11 NOTE — ED Notes (Addendum)
Pt c/o cough with white phlegm for 4 days with now rib cage/back pain when she coughs.  Pt states that last night she started having diarrhea, gone to bathroom about 20 times since this morning at 4am.

## 2014-12-11 NOTE — ED Notes (Signed)
IV line established, but unable to obtain lab samples.  Lab notified to send someone to draw blood.

## 2014-12-11 NOTE — ED Notes (Signed)
Patient ambulated in hallway.  Increased coughing with movement.  No increased SOB reported.

## 2014-12-11 NOTE — ED Notes (Signed)
UNSUCCESSFUL ATTEMPT AT BLOOD WORK X1

## 2014-12-11 NOTE — ED Provider Notes (Signed)
CSN: [id number]     Arrival date & time 12/11/14  1025 History   First MD Initiated Contact with Patient 12/11/14 1122     Chief Complaint  Patient presents with  . rib cage pain   . Cough  . Diarrhea     HPI Patient presents complaining of productive cough as well as some shortness of breath with exertion.  She continues to smoke cigarettes but is trying to cut back.  She's had productive cough.  She also reports diarrhea over the past 24 hours.  She has not tried any inhalers.  She does not carry diagnosis of COPD or asthma.  Her symptoms are mild to moderate in severity.  Denies orthopnea.  No history of congestive heart failure.    Past Medical History  Diagnosis Date  . Allergy   . Anemia   . Anxiety   . Depression   . Neuromuscular disorder   . Hypertension   . Arthritis    Past Surgical History  Procedure Laterality Date  . Appendectomy    . Breast surgery    . Tubal ligation    . Abdominal hysterectomy    . Knee surgery  2005    2 times left   Family History  Problem Relation Age of Onset  . Cancer Mother     breast and ovarian  . Hypertension Father   . Hyperlipidemia Father   . Cancer Maternal Grandmother     breast  . Cancer Paternal Grandmother   . Cancer Paternal Grandfather   . Cancer Maternal Aunt     breast  . Cancer Maternal Aunt     breast   History  Substance Use Topics  . Smoking status: Current Every Day Smoker    Types: Cigarettes  . Smokeless tobacco: Never Used  . Alcohol Use: No   OB History    No data available     Review of Systems  All other systems reviewed and are negative.     Allergies  Demerol  Home Medications   Prior to Admission medications   Medication Sig Start Date End Date Taking? Authorizing Provider  acetaminophen (TYLENOL) 500 MG tablet Take 1,000 mg by mouth every 6 (six) hours as needed for moderate pain.   Yes Historical Provider, MD  ARIPiprazole (ABILIFY) 10 MG tablet Take 10 mg by mouth daily.    Yes Historical Provider, MD  DULoxetine (CYMBALTA) 20 MG capsule Take 20 mg by mouth 2 (two) times daily.   Yes Historical Provider, MD  gabapentin (NEURONTIN) 100 MG capsule Take 100 mg by mouth 3 (three) times daily.   Yes Historical Provider, MD  traZODone (DESYREL) 100 MG tablet Take 200 mg by mouth at bedtime.   Yes Historical Provider, MD  albuterol (PROVENTIL HFA;VENTOLIN HFA) 108 (90 BASE) MCG/ACT inhaler Inhale 2 puffs into the lungs every 4 (four) hours as needed for wheezing or shortness of breath. 12/11/14   Hoy Morn, MD  Diclofenac Sodium 3 % GEL Apply to affected area up to 4 times daily Patient not taking: Reported on 12/11/2014 05/26/14   Waldemar Dickens, MD                 BP 125/64 mmHg  Pulse 84  Temp(Src) 97.8 F (36.6 C) (Oral)  Resp 24  SpO2 99% Physical Exam  Constitutional: She is oriented to person, place, and time. She appears well-developed and well-nourished. No distress.  HENT:  Head: Normocephalic and atraumatic.  Eyes: EOM  are normal.  Neck: Normal range of motion.  Cardiovascular: Normal rate, regular rhythm and normal heart sounds.   Pulmonary/Chest: Effort normal. She has no wheezes.  Rhonchi present  Abdominal: Soft. She exhibits no distension. There is no tenderness.  Musculoskeletal: Normal range of motion.  Neurological: She is alert and oriented to person, place, and time.  Skin: Skin is warm and dry.  Psychiatric: She has a normal mood and affect. Judgment normal.  Nursing note and vitals reviewed.   ED Course  Procedures (including critical care time) Labs Review Labs Reviewed  CBC WITH DIFFERENTIAL - Abnormal; Notable for the following:    WBC 14.7 (*)    Hemoglobin 11.3 (*)    HCT 35.6 (*)    Neutro Abs 11.3 (*)    All other components within normal limits  COMPREHENSIVE METABOLIC PANEL - Abnormal; Notable for the following:    Potassium 3.1 (*)    Glucose, Bld 122 (*)    All other components within normal limits   URINALYSIS, ROUTINE W REFLEX MICROSCOPIC - Abnormal; Notable for the following:    Color, Urine AMBER (*)    APPearance CLOUDY (*)    Specific Gravity, Urine 1.031 (*)    Hgb urine dipstick TRACE (*)    Bilirubin Urine SMALL (*)    All other components within normal limits  URINE MICROSCOPIC-ADD ON - Abnormal; Notable for the following:    Squamous Epithelial / LPF MANY (*)    Bacteria, UA FEW (*)    All other components within normal limits  TROPONIN I    Imaging Review Dg Chest 2 View  12/11/2014   CLINICAL DATA:  Cough and congestion for 4 day  EXAM: CHEST  2 VIEW  COMPARISON:  None.  FINDINGS: Patchy airspace opacities at the left base, right base, and right upper lobe. Upper normal heart size. No pneumothorax. No pleural effusion. Mild hyperaeration.  IMPRESSION: Patchy bilateral airspace opacities. Follow-up studies are recommended until completely resolved.   Electronically Signed   By: Maryclare Bean M.D.   On: 12/11/2014 12:37  I personally reviewed the imaging tests through PACS system I reviewed available ER/hospitalization records through the EMR    EKG Interpretation None      MDM   Final diagnoses:  Chest pain  Cough  CAP (community acquired pneumonia)    Patient feels much better after the dilators at this time.  She ambulated in the hallway.  She states some shortness of breath but was not hypoxic.  She states she is feeling better and is agreeable to outpatient management.  Home with albuterol as well as Levaquin.  At this time and do not think she steroids.  She understands to return to the ER for new or worsening symptoms.    Hoy Morn, MD 12/11/14 [phone]

## 2014-12-11 NOTE — ED Notes (Signed)
Patient placed on 2 liters O2 nasal cannula due to sat level of 92%.  Some accessory muscle use noted with respirations.

## 2015-02-22 ENCOUNTER — Encounter (HOSPITAL_COMMUNITY): Payer: Self-pay | Admitting: Emergency Medicine

## 2015-02-22 ENCOUNTER — Inpatient Hospital Stay (HOSPITAL_COMMUNITY)
Admission: EM | Admit: 2015-02-22 | Discharge: 2015-02-24 | DRG: 195 | Disposition: A | Discharge status: Home / Self Care | Payer: Medicare Other | Attending: Internal Medicine | Admitting: Internal Medicine

## 2015-02-22 ENCOUNTER — Emergency Department (HOSPITAL_COMMUNITY): Payer: Medicare Other

## 2015-02-22 DIAGNOSIS — Z7901 Long term (current) use of anticoagulants: Secondary | ICD-10-CM

## 2015-02-22 DIAGNOSIS — M545 Low back pain, unspecified: Secondary | ICD-10-CM | POA: Diagnosis present

## 2015-02-22 DIAGNOSIS — R197 Diarrhea, unspecified: Secondary | ICD-10-CM | POA: Diagnosis present

## 2015-02-22 DIAGNOSIS — F329 Major depressive disorder, single episode, unspecified: Secondary | ICD-10-CM | POA: Diagnosis present

## 2015-02-22 DIAGNOSIS — E876 Hypokalemia: Secondary | ICD-10-CM | POA: Diagnosis present

## 2015-02-22 DIAGNOSIS — R0902 Hypoxemia: Secondary | ICD-10-CM

## 2015-02-22 DIAGNOSIS — R05 Cough: Secondary | ICD-10-CM | POA: Diagnosis present

## 2015-02-22 DIAGNOSIS — D649 Anemia, unspecified: Secondary | ICD-10-CM | POA: Diagnosis present

## 2015-02-22 DIAGNOSIS — Z90710 Acquired absence of both cervix and uterus: Secondary | ICD-10-CM | POA: Diagnosis not present

## 2015-02-22 DIAGNOSIS — R059 Cough, unspecified: Secondary | ICD-10-CM | POA: Diagnosis present

## 2015-02-22 DIAGNOSIS — Z720 Tobacco use: Secondary | ICD-10-CM

## 2015-02-22 DIAGNOSIS — J449 Chronic obstructive pulmonary disease, unspecified: Secondary | ICD-10-CM | POA: Diagnosis present

## 2015-02-22 DIAGNOSIS — G709 Myoneural disorder, unspecified: Secondary | ICD-10-CM | POA: Diagnosis present

## 2015-02-22 DIAGNOSIS — Z888 Allergy status to other drugs, medicaments and biological substances status: Secondary | ICD-10-CM | POA: Diagnosis not present

## 2015-02-22 DIAGNOSIS — I10 Essential (primary) hypertension: Secondary | ICD-10-CM | POA: Diagnosis present

## 2015-02-22 DIAGNOSIS — F339 Major depressive disorder, recurrent, unspecified: Secondary | ICD-10-CM | POA: Diagnosis present

## 2015-02-22 DIAGNOSIS — M1990 Unspecified osteoarthritis, unspecified site: Secondary | ICD-10-CM | POA: Diagnosis present

## 2015-02-22 DIAGNOSIS — J189 Pneumonia, unspecified organism: Principal | ICD-10-CM | POA: Diagnosis present

## 2015-02-22 DIAGNOSIS — F419 Anxiety disorder, unspecified: Secondary | ICD-10-CM | POA: Diagnosis present

## 2015-02-22 DIAGNOSIS — F17210 Nicotine dependence, cigarettes, uncomplicated: Secondary | ICD-10-CM | POA: Diagnosis present

## 2015-02-22 DIAGNOSIS — J101 Influenza due to other identified influenza virus with other respiratory manifestations: Secondary | ICD-10-CM | POA: Diagnosis present

## 2015-02-22 DIAGNOSIS — F32A Depression, unspecified: Secondary | ICD-10-CM | POA: Diagnosis present

## 2015-02-22 DIAGNOSIS — G8929 Other chronic pain: Secondary | ICD-10-CM | POA: Diagnosis present

## 2015-02-22 LAB — CBC WITH DIFFERENTIAL/PLATELET
BASOS PCT: 0 % (ref 0–1)
Basophils Absolute: 0 10*3/uL (ref 0.0–0.1)
Eosinophils Absolute: 0 10*3/uL (ref 0.0–0.7)
Eosinophils Relative: 0 % (ref 0–5)
HCT: 32.6 % — ABNORMAL LOW (ref 36.0–46.0)
Hemoglobin: 10.4 g/dL — ABNORMAL LOW (ref 12.0–15.0)
Lymphocytes Relative: 14 % (ref 12–46)
Lymphs Abs: 0.7 10*3/uL (ref 0.7–4.0)
MCH: 26.5 pg (ref 26.0–34.0)
MCHC: 31.9 g/dL (ref 30.0–36.0)
MCV: 83 fL (ref 78.0–100.0)
Monocytes Absolute: 0.5 10*3/uL (ref 0.1–1.0)
Monocytes Relative: 9 % (ref 3–12)
Neutro Abs: 4 10*3/uL (ref 1.7–7.7)
Neutrophils Relative %: 77 % (ref 43–77)
PLATELETS: 246 10*3/uL (ref 150–400)
RBC: 3.93 MIL/uL (ref 3.87–5.11)
RDW: 16.1 % — AB (ref 11.5–15.5)
WBC: 5.2 10*3/uL (ref 4.0–10.5)

## 2015-02-22 LAB — COMPREHENSIVE METABOLIC PANEL
ALK PHOS: 91 U/L (ref 39–117)
ALT: 11 U/L (ref 0–35)
AST: 23 U/L (ref 0–37)
Albumin: 3.4 g/dL — ABNORMAL LOW (ref 3.5–5.2)
Anion gap: 7 (ref 5–15)
BUN: 16 mg/dL (ref 6–23)
CO2: 31 mmol/L (ref 19–32)
Calcium: 8.6 mg/dL (ref 8.4–10.5)
Chloride: 102 mmol/L (ref 96–112)
Creatinine, Ser: 0.74 mg/dL (ref 0.50–1.10)
GFR calc non Af Amer: 90 mL/min — ABNORMAL LOW (ref >90)
GLUCOSE: 102 mg/dL — AB (ref 70–99)
Potassium: 2.9 mmol/L — ABNORMAL LOW (ref 3.5–5.1)
Sodium: 140 mmol/L (ref 135–145)
TOTAL PROTEIN: 6.7 g/dL (ref 6.0–8.3)
Total Bilirubin: 0.4 mg/dL (ref 0.3–1.2)

## 2015-02-22 LAB — URINALYSIS, ROUTINE W REFLEX MICROSCOPIC
Bilirubin Urine: NEGATIVE
Glucose, UA: NEGATIVE mg/dL
Hgb urine dipstick: NEGATIVE
Ketones, ur: NEGATIVE mg/dL
Leukocytes, UA: NEGATIVE
Nitrite: NEGATIVE
Protein, ur: 30 mg/dL — AB
Specific Gravity, Urine: 1.037 — ABNORMAL HIGH (ref 1.005–1.030)
Urobilinogen, UA: 0.2 mg/dL (ref 0.0–1.0)
pH: 6 (ref 5.0–8.0)

## 2015-02-22 LAB — LIPASE, BLOOD: Lipase: 13 U/L (ref 11–59)

## 2015-02-22 LAB — URINE MICROSCOPIC-ADD ON

## 2015-02-22 MED ORDER — MORPHINE SULFATE 4 MG/ML IJ SOLN
4.0000 mg | Freq: Once | INTRAMUSCULAR | Status: AC
  Administered 2015-02-22: 4 mg via INTRAVENOUS
  Filled 2015-02-22: qty 1

## 2015-02-22 MED ORDER — POTASSIUM CHLORIDE 10 MEQ/100ML IV SOLN
10.0000 meq | Freq: Once | INTRAVENOUS | Status: AC
  Administered 2015-02-22: 10 meq via INTRAVENOUS
  Filled 2015-02-22: qty 100

## 2015-02-22 MED ORDER — POTASSIUM CHLORIDE CRYS ER 20 MEQ PO TBCR
40.0000 meq | EXTENDED_RELEASE_TABLET | Freq: Once | ORAL | Status: AC
  Administered 2015-02-22: 40 meq via ORAL
  Filled 2015-02-22: qty 2

## 2015-02-22 MED ORDER — SODIUM CHLORIDE 0.9 % IV SOLN: 1000.0000 mL | Freq: Once | INTRAVENOUS | Status: DC

## 2015-02-22 MED ORDER — HYDROCODONE-ACETAMINOPHEN 5-325 MG PO TABS
1.0000 | ORAL_TABLET | Freq: Four times a day (QID) | ORAL | Status: DC | PRN
  Administered 2015-02-22 – 2015-02-23 (×4): 1 via ORAL
  Filled 2015-02-22 (×4): qty 1

## 2015-02-22 MED ORDER — SODIUM CHLORIDE 0.9 % IV SOLN
1000.0000 mL | INTRAVENOUS | Status: DC
  Administered 2015-02-22: 1000 mL via INTRAVENOUS

## 2015-02-22 MED ORDER — ONDANSETRON HCL 4 MG/2ML IJ SOLN
4.0000 mg | Freq: Once | INTRAMUSCULAR | Status: AC
  Administered 2015-02-22: 4 mg via INTRAVENOUS
  Filled 2015-02-22: qty 2

## 2015-02-22 MED ORDER — POTASSIUM CHLORIDE CRYS ER 20 MEQ PO TBCR: 40.0000 meq | EXTENDED_RELEASE_TABLET | Freq: Once | ORAL | Status: DC

## 2015-02-22 MED ORDER — SODIUM CHLORIDE 0.9 % IV SOLN
1000.0000 mL | Freq: Once | INTRAVENOUS | Status: AC
  Administered 2015-02-22: 1000 mL via INTRAVENOUS

## 2015-02-22 NOTE — ED Provider Notes (Signed)
CSN: [id number]     Arrival date & time 02/22/15  1724 History   First MD Initiated Contact with Patient 02/22/15 1903     Chief Complaint  Patient presents with  . Diarrhea  . Chills  . Generalized Body Aches    HPI Symptoms started yesterday.  "I feel like crap"  Pt has been having a frequent cough.  She has been having bodyaches  And diarrhea multiple times throughout the day and night.   She has been feeling feverish.  No temp taken.  Room mate is here with similar symptoms.  Pt did not get her flu shot.  Nothing seems to help.  The symptoms have been getting gradually worse. Past Medical History  Diagnosis Date  . Allergy   . Anemia   . Anxiety   . Depression   . Neuromuscular disorder   . Hypertension   . Arthritis    Past Surgical History  Procedure Laterality Date  . Appendectomy    . Breast surgery    . Tubal ligation    . Abdominal hysterectomy    . Knee surgery  2005    2 times left   Family History  Problem Relation Age of Onset  . Cancer Mother     breast and ovarian  . Hypertension Father   . Hyperlipidemia Father   . Cancer Maternal Grandmother     breast  . Cancer Paternal Grandmother   . Cancer Paternal Grandfather   . Cancer Maternal Aunt     breast  . Cancer Maternal Aunt     breast   History  Substance Use Topics  . Smoking status: Current Every Day Smoker    Types: Cigarettes  . Smokeless tobacco: Never Used  . Alcohol Use: No   OB History    No data available     Review of Systems  All other systems reviewed and are negative.     Allergies  Demerol  Home Medications   Prior to Admission medications   Medication Sig Start Date End Date Taking? Authorizing Provider  acetaminophen (TYLENOL) 500 MG tablet Take 1,000 mg by mouth every 6 (six) hours as needed for moderate pain (pain).    Yes Historical Provider, MD  albuterol (PROVENTIL HFA;VENTOLIN HFA) 108 (90 BASE) MCG/ACT inhaler Inhale 2 puffs into the lungs every 4 (four)  hours as needed for wheezing or shortness of breath. 12/11/14  Yes Jola Schmidt, MD  ARIPiprazole (ABILIFY) 10 MG tablet Take 10 mg by mouth daily.   Yes Historical Provider, MD  DULoxetine (CYMBALTA) 20 MG capsule Take 20 mg by mouth 2 (two) times daily.   Yes Historical Provider, MD  gabapentin (NEURONTIN) 100 MG capsule Take 100 mg by mouth 3 (three) times daily.   Yes Historical Provider, MD  traZODone (DESYREL) 100 MG tablet Take 200 mg by mouth at bedtime.   Yes Historical Provider, MD  Diclofenac Sodium 3 % GEL Apply to affected area up to 4 times daily Patient not taking: Reported on 12/11/2014 05/26/14   Waldemar Dickens, MD  HYDROcodone-acetaminophen (NORCO/VICODIN) 5-325 MG per tablet Take 1 tablet by mouth every 4 (four) hours as needed for moderate pain. Patient not taking: Reported on 02/22/2015 12/11/14   Jola Schmidt, MD  levofloxacin (LEVAQUIN) 500 MG tablet Take 1 tablet (500 mg total) by mouth daily. Patient not taking: Reported on 02/22/2015 12/11/14   Jola Schmidt, MD   BP 120/69 mmHg  Pulse 78  Temp(Src) 98.7 F (37.1 C) (  Oral)  Resp 16  SpO2 92% Physical Exam  Constitutional: No distress.  HENT:  Head: Normocephalic and atraumatic.  Right Ear: External ear normal.  Left Ear: External ear normal.  Eyes: Conjunctivae are normal. Right eye exhibits no discharge. Left eye exhibits no discharge. No scleral icterus.  Neck: Neck supple. No tracheal deviation present.  Cardiovascular: Normal rate, regular rhythm and intact distal pulses.   Pulmonary/Chest: Effort normal and breath sounds normal. No stridor. No respiratory distress. She has no wheezes. She has no rales.  Frequent cough   Abdominal: Soft. Bowel sounds are normal. She exhibits no distension. There is no tenderness. There is no rebound and no guarding.  Musculoskeletal: She exhibits no edema or tenderness.  Neurological: She is alert. She has normal strength. No cranial nerve deficit (no facial droop, extraocular  movements intact, no slurred speech) or sensory deficit. She exhibits normal muscle tone. She displays no seizure activity. Coordination normal.  Skin: Skin is warm and dry. No rash noted. She is not diaphoretic.  Psychiatric: She has a normal mood and affect.  Nursing note and vitals reviewed.   ED Course  Procedures (including critical care time) Labs Review Labs Reviewed  CBC WITH DIFFERENTIAL/PLATELET - Abnormal; Notable for the following:    Hemoglobin 10.4 (*)    HCT 32.6 (*)    RDW 16.1 (*)    All other components within normal limits  URINALYSIS, ROUTINE W REFLEX MICROSCOPIC - Abnormal; Notable for the following:    APPearance CLOUDY (*)    Specific Gravity, Urine 1.037 (*)    Protein, ur 30 (*)    All other components within normal limits  COMPREHENSIVE METABOLIC PANEL - Abnormal; Notable for the following:    Potassium 2.9 (*)    Glucose, Bld 102 (*)    Albumin 3.4 (*)    GFR calc non Af Amer 90 (*)    All other components within normal limits  URINE MICROSCOPIC-ADD ON - Abnormal; Notable for the following:    Squamous Epithelial / LPF MANY (*)    Bacteria, UA MANY (*)    All other components within normal limits  LIPASE, BLOOD    Imaging Review Dg Chest 2 View  02/22/2015   CLINICAL DATA:  Chills.  Body aches.  Fever.  EXAM: CHEST  2 VIEW  COMPARISON:  12/11/2014.  FINDINGS: The chest radiograph appears very similar to the prior examination. Cardiopericardial silhouette is within normal limits. Mediastinal contours are normal. There is diffuse prominence of the interstitial markings. The pattern is suggestive of interstitial lung disease. Interstitial pulmonary edema is less likely. No Kerley B-lines are identified. There is no focal consolidation.  IMPRESSION: Similar appearance of the chest to the prior exam of 12/11/2014. Diffuse interstitial opacities are present, which may represent atypical infection such as mycoplasma or chronic interstitial lung disease.    Electronically Signed   By: Dereck Ligas M.D.   On: 02/22/2015 20:45    Medications  0.9 %  sodium chloride infusion (not administered)    Followed by  0.9 %  sodium chloride infusion (not administered)    Followed by  0.9 %  sodium chloride infusion (1,000 mLs Intravenous New Bag/Given 02/22/15 1935)  ondansetron [facility]) injection 4 mg (4 mg Intravenous Given 02/22/15 1933)  morphine 4 MG/ML injection 4 mg (4 mg Intravenous Given 02/22/15 1933)  potassium chloride SA (K-DUR,KLOR-CON) CR tablet 40 mEq (40 mEq Oral Given 02/22/15 2005)  potassium chloride 10 mEq in 100 mL IVPB (10  mEq Intravenous New Bag/Given 02/22/15 2147)  morphine 4 MG/ML injection 4 mg (4 mg Intravenous Given 02/22/15 2221)     MDM   Final diagnoses:  Hypoxia  CAP (community acquired pneumonia)  Hypokalemia    Pt presents with fever, cough, diarrhea and myalgias.  ? Influenza, atypical PNA.  Improved with iv fluids however still having body aches.  Room air oxygen dropped into the 80s.  Pt is not on oxygen at home.  Will start her on iv abx.  Admit for possible CAP.    Dorie Rank, MD 02/23/15 [phone]

## 2015-02-22 NOTE — H&P (Addendum)
[facility] History and Physical  KIRSTYN LEAN WPY:[id number] DOB: [birth year] DOA: 02/22/2015  Referring physician: ED physician PCP: PROVIDER NOT IN SYSTEM  Specialists:   Chief Complaint: Cough, shortness of breath and diarrhea  HPI: Morgan [name] is a 64 y.o. female with past medical history of hypertension (not on medication), depression, anxiety, chronic low back pain, who presents with dry cough, shortness of breath, diarrhea.  Patient reports that she started having dry cough, shortness of breath, runny nose since yesterday. No sore throat. She also has mild chest pain, fever and chills. Patient has sick contact with her roommate, who has flu.  Patient also reports having diarrhea, nausea and vomiting. She vomited once today. She had 9-10 bowel movements with loose stool today. She has mild abdominal pain. Patient also reports having burning on urination and dysuria in the past several days. Patient denies skin rashes or leg swelling. No unilateral weakness, numbness or tingling sensations. No vision change or hearing loss.  In ED, patient was found to have similar appearance of the chest to the prior exam of 12/11/2014. Diffuse interstitial opacities are present, which may represent atypical infection such as mycoplasma or chronic interstitial lung disease per radiologist. Papyracea and a 9.7. No tachycardia. No leukocytosis. Potassium 2.9. Renal function normal. Lipase 13. Negative urinalysis. Patient is admitted to inpatient for further evaluation treatment.  Review of Systems: As presented in the history of presenting illness, rest negative.  Where does patient live?  At home Can patient participate in ADLs? Yes  Allergy:  Allergies  Allergen Reactions  . Demerol [Meperidine]     Hives     Past Medical History  Diagnosis Date  . Allergy   . Anemia   . Anxiety   . Depression   . Neuromuscular disorder   . Hypertension   . Arthritis     Past Surgical History   Procedure Laterality Date  . Appendectomy    . Breast surgery    . Tubal ligation    . Abdominal hysterectomy    . Knee surgery  2005    2 times left    Social History:  reports that she has been smoking Cigarettes.  She has never used smokeless tobacco. She reports that she does not drink alcohol or use illicit drugs.  Family History:  Family History  Problem Relation Age of Onset  . Cancer Mother     breast and ovarian  . Hypertension Father   . Hyperlipidemia Father   . Cancer Maternal Grandmother     breast  . Cancer Paternal Grandmother   . Cancer Paternal Grandfather   . Cancer Maternal Aunt     breast  . Cancer Maternal Aunt     breast     Prior to Admission medications   Medication Sig Start Date End Date Taking? Authorizing Provider  acetaminophen (TYLENOL) 500 MG tablet Take 1,000 mg by mouth every 6 (six) hours as needed for moderate pain (pain).    Yes Historical Provider, MD  albuterol (PROVENTIL HFA;VENTOLIN HFA) 108 (90 BASE) MCG/ACT inhaler Inhale 2 puffs into the lungs every 4 (four) hours as needed for wheezing or shortness of breath. 12/11/14  Yes Jola Schmidt, MD  ARIPiprazole (ABILIFY) 10 MG tablet Take 10 mg by mouth daily.   Yes Historical Provider, MD  DULoxetine (CYMBALTA) 20 MG capsule Take 20 mg by mouth 2 (two) times daily.   Yes Historical Provider, MD  gabapentin (NEURONTIN) 100 MG capsule Take 100 mg by  mouth 3 (three) times daily.   Yes Historical Provider, MD  traZODone (DESYREL) 100 MG tablet Take 200 mg by mouth at bedtime.   Yes Historical Provider, MD  Diclofenac Sodium 3 % GEL Apply to affected area up to 4 times daily Patient not taking: Reported on 12/11/2014 05/26/14   Waldemar Dickens, MD  HYDROcodone-acetaminophen (NORCO/VICODIN) 5-325 MG per tablet Take 1 tablet by mouth every 4 (four) hours as needed for moderate pain. Patient not taking: Reported on 02/22/2015 12/11/14   Jola Schmidt, MD  levofloxacin (LEVAQUIN) 500 MG tablet Take 1  tablet (500 mg total) by mouth daily. Patient not taking: Reported on 02/22/2015 12/11/14   Jola Schmidt, MD    Physical Exam: Filed Vitals:   02/22/15 2209 02/22/15 2326 02/23/15 0030 02/23/15 0107  BP:  129/72 129/73 125/63  Pulse:  81 68 70  Temp: 99.7 F (37.6 C)  99.8 F (37.7 C) 98.8 F (37.1 C)  TempSrc:   Oral Oral  Resp:  20 22 20   Height:    5\' 10"  (1.778 m)  Weight:    84.596 kg (186 lb 8 oz)  SpO2:  91% 97% 92%   General: Not in acute distress HEENT:       Eyes: PERRL, EOMI, no scleral icterus       ENT: No discharge from the ears and nose, no pharynx injection, no tonsillar enlargement.        Neck: No JVD, no bruit, no mass felt. Cardiac: S1/S2, RRR, No murmurs, No gallops or rubs Pulm: fine crackles diffusely, scattered rhonchi.  Abd: Soft, nondistended, nontender, no rebound pain, no organomegaly, BS present Ext: No edema bilaterally. 2+DP/PT pulse bilaterally Musculoskeletal: No joint deformities, erythema, or stiffness, ROM full Skin: No rashes.  Neuro: Alert and oriented X3, cranial nerves II-XII grossly intact, muscle strength 5/5 in all extremeties, sensation to light touch intact. Brachial reflex 2+ bilaterally. Knee reflex 1+ bilaterally. Negative Babinski's sign. Normal finger to nose test. Psych: Patient is not psychotic, no suicidal or hemocidal ideation.  Labs on Admission:  Basic Metabolic Panel:  Recent Labs Lab 02/22/15 1843  NA 140  K 2.9*  CL 102  CO2 31  GLUCOSE 102*  BUN 16  CREATININE 0.74  CALCIUM 8.6   Liver Function Tests:  Recent Labs Lab 02/22/15 1843  AST 23  ALT 11  ALKPHOS 91  BILITOT 0.4  PROT 6.7  ALBUMIN 3.4*    Recent Labs Lab 02/22/15 1843  LIPASE 13   No results for input(s): AMMONIA in the last 168 hours. CBC:  Recent Labs Lab 02/22/15 1803  WBC 5.2  NEUTROABS 4.0  HGB 10.4*  HCT 32.6*  MCV 83.0  PLT 246   Cardiac Enzymes: No results for input(s): CKTOTAL, CKMB, CKMBINDEX, TROPONINI in the  last 168 hours.  BNP (last 3 results) No results for input(s): BNP in the last 8760 hours.  ProBNP (last 3 results) No results for input(s): PROBNP in the last 8760 hours.  CBG: No results for input(s): GLUCAP in the last 168 hours.  Radiological Exams on Admission: Dg Chest 2 View  02/22/2015   CLINICAL DATA:  Chills.  Body aches.  Fever.  EXAM: CHEST  2 VIEW  COMPARISON:  12/11/2014.  FINDINGS: The chest radiograph appears very similar to the prior examination. Cardiopericardial silhouette is within normal limits. Mediastinal contours are normal. There is diffuse prominence of the interstitial markings. The pattern is suggestive of interstitial lung disease. Interstitial pulmonary edema is less  likely. No Kerley B-lines are identified. There is no focal consolidation.  IMPRESSION: Similar appearance of the chest to the prior exam of 12/11/2014. Diffuse interstitial opacities are present, which may represent atypical infection such as mycoplasma or chronic interstitial lung disease.   Electronically Signed   By: Dereck Ligas M.D.   On: 02/22/2015 20:45    EKG: will get one   Assessment/Plan Principal Problem:   Cough Active Problems:   Chronic lower back pain   Diarrhea   Hypokalemia   Tobacco abuse   Depression  Dry cough, shortness of breath, runny nose, mild chest pain: Differential diagnosis including flu given her sick contact with her roommate who has flu, and atypical pneumonia as evidenced by chest x-ray. Patient has long history of smoking, she could have COPD, but she does not seem to have COPD exacerbation given no productive cough or wheezing on auscultation.  - Will admit to med-surg bed - empiric antibiotics: Levaquin for possible pneumonia and Tamiflu for possible flu - Urine legionella and S. pneumococcal antigen - IVF: 125 cc/h - Albuterol and DuoNeb Nebulizer for SOB - solumedrol: 60 mg bid - Follow up blood culture x2, sputum culture and respiratory virus  panel  Diarrhea: Patient has nausea vomiting and diarrhea and mild abdominal pain. No recent antibiotics use. This is likely due to viral gastroenteritis, but cannot rule out other possibility, such as C. difficile colitis. Lipase negative. -IV fluids: 2 L normal saline, followed by 1 25 mL per hour of normal saline -Check C. difficile PCR and GI pathogen panel - Zofran for nausea  Hypokalemia: Potassium 2.9 on admission -Repleted -Check magnesium level  Tobacco abuse -Nicotine patch  Depression: Stable. No suicidal or homicidal ideations. -Continue home Abilify and Cymbalta  Dysuria: Urinalysis is negative.  -follow-up urine culture  DVT ppx: SQ Heparin     Code Status: Full code Family Communication: None at bed side.    Disposition Plan: Admit to inpatient   Date of Service 02/23/2015    Ivor Costa [facility] Pager [phone]  If 7PM-7AM, please contact night-coverage [email or web address] Password [email or web address] 02/23/2015, 3:08 AM

## 2015-02-22 NOTE — ED Notes (Signed)
Pt c/o diarrhea, chills, body aches, "high fever and low fever" since Sunday.  Pt's room mate is here for similar symptoms.

## 2015-02-22 NOTE — Progress Notes (Signed)
EDCM spoke to patient at bedside.  Patient confirms her pcp is located at the [facility].  System updated.

## 2015-02-23 DIAGNOSIS — F339 Major depressive disorder, recurrent, unspecified: Secondary | ICD-10-CM | POA: Diagnosis present

## 2015-02-23 DIAGNOSIS — F32A Depression, unspecified: Secondary | ICD-10-CM | POA: Diagnosis present

## 2015-02-23 DIAGNOSIS — J1100 Influenza due to unidentified influenza virus with unspecified type of pneumonia: Secondary | ICD-10-CM

## 2015-02-23 DIAGNOSIS — I10 Essential (primary) hypertension: Secondary | ICD-10-CM | POA: Insufficient documentation

## 2015-02-23 DIAGNOSIS — F329 Major depressive disorder, single episode, unspecified: Secondary | ICD-10-CM | POA: Diagnosis present

## 2015-02-23 LAB — CBC
HCT: 30.4 % — ABNORMAL LOW (ref 36.0–46.0)
Hemoglobin: 9.8 g/dL — ABNORMAL LOW (ref 12.0–15.0)
MCH: 27.5 pg (ref 26.0–34.0)
MCHC: 32.2 g/dL (ref 30.0–36.0)
MCV: 85.2 fL (ref 78.0–100.0)
Platelets: 215 10*3/uL (ref 150–400)
RBC: 3.57 MIL/uL — ABNORMAL LOW (ref 3.87–5.11)
RDW: 16.3 % — AB (ref 11.5–15.5)
WBC: 4.5 10*3/uL (ref 4.0–10.5)

## 2015-02-23 LAB — BASIC METABOLIC PANEL
Anion gap: 8 (ref 5–15)
BUN: 12 mg/dL (ref 6–23)
CO2: 26 mmol/L (ref 19–32)
Calcium: 7.6 mg/dL — ABNORMAL LOW (ref 8.4–10.5)
Chloride: 101 mmol/L (ref 96–112)
Creatinine, Ser: 0.74 mg/dL (ref 0.50–1.10)
GFR calc non Af Amer: 90 mL/min — ABNORMAL LOW (ref >90)
Glucose, Bld: 121 mg/dL — ABNORMAL HIGH (ref 70–99)
POTASSIUM: 3.6 mmol/L (ref 3.5–5.1)
Sodium: 135 mmol/L (ref 135–145)

## 2015-02-23 LAB — LACTIC ACID, PLASMA
Lactic Acid, Venous: 0.8 mmol/L (ref 0.5–2.0)
Lactic Acid, Venous: 0.6 mmol/L (ref 0.5–2.0)

## 2015-02-23 LAB — CLOSTRIDIUM DIFFICILE BY PCR: Toxigenic C. Difficile by PCR: NEGATIVE

## 2015-02-23 LAB — PROTIME-INR
INR: 1.01 (ref 0.00–1.49)
Prothrombin Time: 13.4 seconds (ref 11.6–15.2)

## 2015-02-23 LAB — MAGNESIUM: MAGNESIUM: 1.6 mg/dL (ref 1.5–2.5)

## 2015-02-23 LAB — APTT: aPTT: 28 seconds (ref 24–37)

## 2015-02-23 LAB — INFLUENZA PANEL BY PCR (TYPE A & B)
H1N1 flu by pcr: DETECTED — AB
INFLAPCR: POSITIVE — AB
Influenza B By PCR: NEGATIVE

## 2015-02-23 LAB — STREP PNEUMONIAE URINARY ANTIGEN: STREP PNEUMO URINARY ANTIGEN: NEGATIVE

## 2015-02-23 LAB — PROCALCITONIN

## 2015-02-23 MED ORDER — METHYLPREDNISOLONE SODIUM SUCC 40 MG IJ SOLR
40.0000 mg | Freq: Two times a day (BID) | INTRAMUSCULAR | Status: DC
  Administered 2015-02-23 – 2015-02-24 (×2): 40 mg via INTRAVENOUS
  Filled 2015-02-23 (×3): qty 1

## 2015-02-23 MED ORDER — ALBUTEROL SULFATE (2.5 MG/3ML) 0.083% IN NEBU
2.5000 mg | INHALATION_SOLUTION | RESPIRATORY_TRACT | Status: DC | PRN
  Filled 2015-02-23: qty 3

## 2015-02-23 MED ORDER — HEPARIN SODIUM (PORCINE) 5000 UNIT/ML IJ SOLN
5000.0000 [IU] | Freq: Three times a day (TID) | INTRAMUSCULAR | Status: DC
  Administered 2015-02-23 – 2015-02-24 (×5): 5000 [IU] via SUBCUTANEOUS
  Filled 2015-02-23 (×8): qty 1

## 2015-02-23 MED ORDER — DULOXETINE HCL 20 MG PO CPEP
20.0000 mg | ORAL_CAPSULE | Freq: Two times a day (BID) | ORAL | Status: DC
  Administered 2015-02-23 – 2015-02-24 (×3): 20 mg via ORAL
  Filled 2015-02-23 (×4): qty 1

## 2015-02-23 MED ORDER — NICOTINE 21 MG/24HR TD PT24
21.0000 mg | MEDICATED_PATCH | Freq: Every day | TRANSDERMAL | Status: DC
  Administered 2015-02-23 – 2015-02-24 (×2): 21 mg via TRANSDERMAL
  Filled 2015-02-23 (×2): qty 1

## 2015-02-23 MED ORDER — ACETAMINOPHEN 325 MG PO TABS
650.0000 mg | ORAL_TABLET | Freq: Four times a day (QID) | ORAL | Status: DC | PRN
Start: 2015-02-23 — End: 2015-02-24
  Administered 2015-02-23 (×3): 650 mg via ORAL
  Filled 2015-02-23 (×3): qty 2

## 2015-02-23 MED ORDER — INFLUENZA VAC SPLIT QUAD 0.5 ML IM SUSY
0.5000 mL | PREFILLED_SYRINGE | INTRAMUSCULAR | Status: AC
  Administered 2015-02-24: 0.5 mL via INTRAMUSCULAR
  Filled 2015-02-23 (×2): qty 0.5

## 2015-02-23 MED ORDER — PNEUMOCOCCAL VAC POLYVALENT 25 MCG/0.5ML IJ INJ
0.5000 mL | INJECTION | INTRAMUSCULAR | Status: AC
  Administered 2015-02-24: 0.5 mL via INTRAMUSCULAR
  Filled 2015-02-23 (×2): qty 0.5

## 2015-02-23 MED ORDER — TRAZODONE HCL 100 MG PO TABS
200.0000 mg | ORAL_TABLET | Freq: Every day | ORAL | Status: DC
  Administered 2015-02-23 (×2): 200 mg via ORAL
  Filled 2015-02-23 (×2): qty 2
  Filled 2015-02-23: qty 4
  Filled 2015-02-23: qty 2

## 2015-02-23 MED ORDER — HYDROCODONE-ACETAMINOPHEN 5-325 MG PO TABS
1.0000 | ORAL_TABLET | Freq: Once | ORAL | Status: AC
  Administered 2015-02-23: 1 via ORAL
  Filled 2015-02-23: qty 1

## 2015-02-23 MED ORDER — IPRATROPIUM-ALBUTEROL 0.5-2.5 (3) MG/3ML IN SOLN
3.0000 mL | RESPIRATORY_TRACT | Status: DC
  Administered 2015-02-23: 3 mL via RESPIRATORY_TRACT
  Filled 2015-02-23: qty 3

## 2015-02-23 MED ORDER — CETYLPYRIDINIUM CHLORIDE 0.05 % MT LIQD
7.0000 mL | Freq: Two times a day (BID) | OROMUCOSAL | Status: DC
  Administered 2015-02-23 (×2): 7 mL via OROMUCOSAL

## 2015-02-23 MED ORDER — ONDANSETRON HCL 4 MG/2ML IJ SOLN: 4.0000 mg | Freq: Three times a day (TID) | INTRAMUSCULAR | Status: DC | PRN

## 2015-02-23 MED ORDER — ARIPIPRAZOLE 10 MG PO TABS
10.0000 mg | ORAL_TABLET | Freq: Every day | ORAL | Status: DC
  Administered 2015-02-23 – 2015-02-24 (×2): 10 mg via ORAL
  Filled 2015-02-23 (×2): qty 1

## 2015-02-23 MED ORDER — CHLORHEXIDINE GLUCONATE 0.12 % MT SOLN
15.0000 mL | Freq: Two times a day (BID) | OROMUCOSAL | Status: DC
  Filled 2015-02-23 (×5): qty 15

## 2015-02-23 MED ORDER — DM-GUAIFENESIN ER 30-600 MG PO TB12
1.0000 | ORAL_TABLET | Freq: Two times a day (BID) | ORAL | Status: DC
  Administered 2015-02-23 – 2015-02-24 (×4): 1 via ORAL
  Filled 2015-02-23 (×5): qty 1

## 2015-02-23 MED ORDER — OSELTAMIVIR PHOSPHATE 75 MG PO CAPS
75.0000 mg | ORAL_CAPSULE | Freq: Two times a day (BID) | ORAL | Status: DC
  Administered 2015-02-23 – 2015-02-24 (×4): 75 mg via ORAL
  Filled 2015-02-23 (×5): qty 1

## 2015-02-23 MED ORDER — SODIUM CHLORIDE 0.9 % IV SOLN
INTRAVENOUS | Status: DC
  Administered 2015-02-23: 02:00:00 via INTRAVENOUS

## 2015-02-23 MED ORDER — HYDROCODONE-ACETAMINOPHEN 5-325 MG PO TABS
1.0000 | ORAL_TABLET | ORAL | Status: DC | PRN
  Administered 2015-02-24 (×2): 1 via ORAL
  Filled 2015-02-23 (×2): qty 1

## 2015-02-23 MED ORDER — HYDROCOD POLST-CHLORPHEN POLST 10-8 MG/5ML PO LQCR
5.0000 mL | Freq: Two times a day (BID) | ORAL | Status: DC | PRN
  Administered 2015-02-24: 5 mL via ORAL
  Filled 2015-02-23 (×2): qty 5

## 2015-02-23 MED ORDER — LEVOFLOXACIN IN D5W 750 MG/150ML IV SOLN
750.0000 mg | Freq: Every day | INTRAVENOUS | Status: DC
  Administered 2015-02-23: 750 mg via INTRAVENOUS
  Filled 2015-02-23: qty 150

## 2015-02-23 MED ORDER — IPRATROPIUM-ALBUTEROL 0.5-2.5 (3) MG/3ML IN SOLN
3.0000 mL | Freq: Three times a day (TID) | RESPIRATORY_TRACT | Status: DC
  Administered 2015-02-23 – 2015-02-24 (×3): 3 mL via RESPIRATORY_TRACT
  Filled 2015-02-23 (×3): qty 3

## 2015-02-23 MED ORDER — GABAPENTIN 100 MG PO CAPS
100.0000 mg | ORAL_CAPSULE | Freq: Three times a day (TID) | ORAL | Status: DC
  Administered 2015-02-23 – 2015-02-24 (×4): 100 mg via ORAL
  Filled 2015-02-23 (×6): qty 1

## 2015-02-23 MED ORDER — METHYLPREDNISOLONE SODIUM SUCC 125 MG IJ SOLR
60.0000 mg | Freq: Two times a day (BID) | INTRAMUSCULAR | Status: DC
  Administered 2015-02-23 (×2): 60 mg via INTRAVENOUS
  Filled 2015-02-23 (×3): qty 0.96

## 2015-02-23 NOTE — Progress Notes (Signed)
ANTIBIOTIC CONSULT NOTE - INITIAL  Pharmacy Consult for Levofloxacin Indication: pneumonia and rule out sepsis  Allergies  Allergen Reactions  . Demerol [Meperidine]     Hives     Patient Measurements: Height: 5\' 10"  (177.8 cm) Weight: 186 lb 8 oz (84.596 kg) IBW/kg (Calculated) : 68.5 Adjusted Body Weight:   Vital Signs: Temp: 98.8 F (37.1 C) (03/09 0107) Temp Source: Oral (03/09 0107) BP: 125/63 mmHg (03/09 0107) Pulse Rate: 70 (03/09 0107) Intake/Output from previous day: 03/08 0701 - 03/09 0700 In: 1000 [I.V.:1000] Out: -  Intake/Output from this shift: Total I/O In: 1000 [I.V.:1000] Out: -   Labs:  Recent Labs  02/22/15 1803 02/22/15 1843  WBC 5.2  --   HGB 10.4*  --   PLT 246  --   CREATININE  --  0.74   Estimated Creatinine Clearance: 87.3 mL/min (by C-G formula based on Cr of 0.74). No results for input(s): VANCOTROUGH, VANCOPEAK, VANCORANDOM, GENTTROUGH, GENTPEAK, GENTRANDOM, TOBRATROUGH, TOBRAPEAK, TOBRARND, AMIKACINPEAK, AMIKACINTROU, AMIKACIN in the last 72 hours.   Microbiology: No results found for this or any previous visit (from the past 720 hour(s)).  Medical History: Past Medical History  Diagnosis Date  . Allergy   . Anemia   . Anxiety   . Depression   . Neuromuscular disorder   . Hypertension   . Arthritis     Medications:  Anti-infectives    Start     Dose/Rate Route Frequency Ordered Stop   02/23/15 0200  levofloxacin (LEVAQUIN) IVPB 750 mg     750 mg 100 mL/hr over 90 Minutes Intravenous Daily at bedtime 02/23/15 0145     02/23/15 0058  oseltamivir (TAMIFLU) capsule 75 mg     75 mg Oral 2 times daily 02/23/15 0058 02/27/15 2159     Assessment: 64 y.o. female with past medical history of hypertension (not on medication), depression, anxiety, chronic low back pain, who presents with dry cough, shortness of breath, diarrhea.  Goal of Therapy:  Levofloxacin dosed based on patient weight and renal function     Plan:  Levofloxacin 750mg  iv q24hr Follow up culture results  [name] 02/23/2015,5:24 AM

## 2015-02-23 NOTE — Progress Notes (Signed)
UR complete 

## 2015-02-23 NOTE — Progress Notes (Addendum)
PATIENT DETAILS Name: Morgan [name] Age: 64 y.o. Sex: female Date of Birth: [birth year] Admit Date: 02/22/2015 Admitting Physician Ivor Costa, MD [id number] NOT IN SYSTEM  Subjective: Feels much better. No further nausea or vomiting. Cough still present but no shortness of breath.  Assessment/Plan: Principal Problem:   Systemic inflammatory response syndrome: Likely secondary to influenza A. continue Tamiflu, will stop levofloxacin. Continue supportive care. Follow blood cultures.  Active Problems:   Influenza A: Presented with fever, myalgias, cough and diarrhea. Continue with Tamiflu. Suspect we could discontinue levofloxacin. Continue with supportive care. Will monitor overnight, if clinical improvement continues, suspect can be discharged home in a.m.    COPD: Continue bronchodilators, rapidly taper her steroids on discharge-decrease Solu-Medrol to 40 mg twice a day. Doubt she has a COPD exacerbation.    Diarrhea: Likely secondary to above. Seems to have resolved. Supportive care and follow.    Hypokalemia: Likely secondary to diarrhea. Resolved. Monitor periodically    Depression: Stable. No suicidal or homicidal ideations. Continue Abilify and Cymbalta    Tobacco abuse: Continue with transdermal nicotine. Counseled.     Dysuria: UA not consistent with UTI. I have discontinued levofloxacin    Chronic lower back pain: Supportive care. Continue with Norco.  Disposition: Remain inpatient-suspect home in a.m. if clinical improvement continues  Antibiotics:  See below   Anti-infectives    Start     Dose/Rate Route Frequency Ordered Stop   02/23/15 0200  levofloxacin (LEVAQUIN) IVPB 750 mg     750 mg 100 mL/hr over 90 Minutes Intravenous Daily at bedtime 02/23/15 0145     02/23/15 0058  oseltamivir (TAMIFLU) capsule 75 mg     75 mg Oral 2 times daily 02/23/15 0058 02/27/15 2159      DVT Prophylaxis: Prophylactic Heparin  Code Status: Full code   Family  Communication None at bedside  Procedures:  None  CONSULTS:  None  Time spent 40 minutes-which includes 50% of the time with face-to-face with patient/ family and coordinating care related to the above assessment and plan.  MEDICATIONS: Scheduled Meds: . sodium chloride  1,000 mL Intravenous Once  . antiseptic oral rinse  7 mL Mouth Rinse q12n4p  . ARIPiprazole  10 mg Oral Daily  . chlorhexidine  15 mL Mouth Rinse BID  . dextromethorphan-guaiFENesin  1 tablet Oral BID  . DULoxetine  20 mg Oral BID  . gabapentin  100 mg Oral TID  . heparin  5,000 Units Subcutaneous 3 times per day  . [START ON 02/24/2015] Influenza vac split quadrivalent PF  0.5 mL Intramuscular Tomorrow-1000  . ipratropium-albuterol  3 mL Nebulization TID  . levofloxacin (LEVAQUIN) IV  750 mg Intravenous QHS  . methylPREDNISolone (SOLU-MEDROL) injection  40 mg Intravenous BID  . nicotine  21 mg Transdermal Daily  . oseltamivir  75 mg Oral BID  . [START ON 02/24/2015] pneumococcal 23 valent vaccine  0.5 mL Intramuscular Tomorrow-1000  . traZODone  200 mg Oral QHS   Continuous Infusions: . sodium chloride Stopped (02/22/15 2345)   PRN Meds:.acetaminophen, albuterol, HYDROcodone-acetaminophen, ondansetron    PHYSICAL EXAM: Vital signs in last 24 hours: Filed Vitals:   02/23/15 0107 02/23/15 0551 02/23/15 0741 02/23/15 1136  BP: 125/63 124/66 130/74 127/84  Pulse: 70 66 63 71  Temp: 98.8 F (37.1 C) 98.4 F (36.9 C) 98.2 F (36.8 C) 98.1 F (36.7 C)  TempSrc: Oral Oral Oral Oral  Resp: 20 18 20 18   Height:  5\' 10"  (1.778 m)     Weight: 84.596 kg (186 lb 8 oz)     SpO2: 92% 96% 94% 97%    Weight change:  Filed Weights   02/23/15 0107  Weight: 84.596 kg (186 lb 8 oz)   Body mass index is 26.76 kg/(m^2).   Gen Exam: Awake and alert with clear speech.   Neck: Supple, No JVD.   Chest: B/L Clear.   CVS: S1 S2 Regular, no murmurs.  Abdomen: soft, BS +, non tender, non distended.  Extremities: no  edema, lower extremities warm to touch. Neurologic: Non Focal.   Skin: No Rash.   Wounds: N/A.    Intake/Output from previous day:  Intake/Output Summary (Last 24 hours) at 02/23/15 1251 Last data filed at 02/23/15 0900  Gross per 24 hour  Intake   1000 ml  Output      0 ml  Net   1000 ml     LAB RESULTS: CBC  Recent Labs Lab 02/22/15 1803 02/23/15 0829  WBC 5.2 4.5  HGB 10.4* 9.8*  HCT 32.6* 30.4*  PLT 246 215  MCV 83.0 85.2  MCH 26.5 27.5  MCHC 31.9 32.2  RDW 16.1* 16.3*  LYMPHSABS 0.7  --   MONOABS 0.5  --   EOSABS 0.0  --   BASOSABS 0.0  --     Chemistries   Recent Labs Lab 02/22/15 1843 02/23/15 0251 02/23/15 0829  NA 140  --  135  K 2.9*  --  3.6  CL 102  --  101  CO2 31  --  26  GLUCOSE 102*  --  121*  BUN 16  --  12  CREATININE 0.74  --  0.74  CALCIUM 8.6  --  7.6*  MG  --  1.6  --     CBG: No results for input(s): GLUCAP in the last 168 hours.  GFR Estimated Creatinine Clearance: 87.3 mL/min (by C-G formula based on Cr of 0.74).  Coagulation profile  Recent Labs Lab 02/23/15 0251  INR 1.01    Cardiac Enzymes No results for input(s): CKMB, TROPONINI, MYOGLOBIN in the last 168 hours.  Invalid input(s): CK  Invalid input(s): POCBNP No results for input(s): DDIMER in the last 72 hours. No results for input(s): HGBA1C in the last 72 hours. No results for input(s): CHOL, HDL, LDLCALC, TRIG, CHOLHDL, LDLDIRECT in the last 72 hours. No results for input(s): TSH, T4TOTAL, T3FREE, THYROIDAB in the last 72 hours.  Invalid input(s): FREET3 No results for input(s): VITAMINB12, FOLATE, FERRITIN, TIBC, IRON, RETICCTPCT in the last 72 hours.  Recent Labs  02/22/15 1843  LIPASE 13    Urine Studies No results for input(s): UHGB, CRYS in the last 72 hours.  Invalid input(s): UACOL, UAPR, USPG, UPH, UTP, UGL, UKET, UBIL, UNIT, UROB, ULEU, UEPI, UWBC, URBC, UBAC, CAST, UCOM, BILUA  MICROBIOLOGY: No results found for this or any  previous visit (from the past 240 hour(s)).  RADIOLOGY STUDIES/RESULTS: Dg Chest 2 View  02/22/2015   CLINICAL DATA:  Chills.  Body aches.  Fever.  EXAM: CHEST  2 VIEW  COMPARISON:  12/11/2014.  FINDINGS: The chest radiograph appears very similar to the prior examination. Cardiopericardial silhouette is within normal limits. Mediastinal contours are normal. There is diffuse prominence of the interstitial markings. The pattern is suggestive of interstitial lung disease. Interstitial pulmonary edema is less likely. No Kerley B-lines are identified. There is no focal consolidation.  IMPRESSION: Similar appearance of the chest to the  prior exam of 12/11/2014. Diffuse interstitial opacities are present, which may represent atypical infection such as mycoplasma or chronic interstitial lung disease.   Electronically Signed   By: Dereck Ligas M.D.   On: 02/22/2015 20:45    Oren Binet, MD  [facility] Pager:[phone]  If 7PM-7AM, please contact night-coverage [email or web address] Password [email or web address] 02/23/2015, 12:51 PM   LOS: 1 day

## 2015-02-24 DIAGNOSIS — J189 Pneumonia, unspecified organism: Secondary | ICD-10-CM | POA: Diagnosis not present

## 2015-02-24 LAB — HIV ANTIBODY (ROUTINE TESTING W REFLEX): HIV Screen 4th Generation wRfx: NONREACTIVE

## 2015-02-24 MED ORDER — HYDROCODONE-ACETAMINOPHEN 5-325 MG PO TABS: 1.0000 | ORAL_TABLET | Freq: Four times a day (QID) | ORAL | Status: DC | PRN

## 2015-02-24 MED ORDER — OSELTAMIVIR PHOSPHATE 75 MG PO CAPS: 75.0000 mg | ORAL_CAPSULE | Freq: Two times a day (BID) | ORAL | Status: DC

## 2015-02-24 MED ORDER — NICOTINE 21 MG/24HR TD PT24: 21.0000 mg | MEDICATED_PATCH | Freq: Every day | TRANSDERMAL | Status: DC

## 2015-02-24 MED ORDER — LEVOFLOXACIN 500 MG PO TABS: 500.0000 mg | ORAL_TABLET | Freq: Every day | ORAL | Status: DC

## 2015-02-24 MED ORDER — GUAIFENESIN ER 600 MG PO TB12: 600.0000 mg | ORAL_TABLET | Freq: Two times a day (BID) | ORAL | Status: DC

## 2015-02-24 MED ORDER — ALBUTEROL SULFATE HFA 108 (90 BASE) MCG/ACT IN AERS: 2.0000 | INHALATION_SPRAY | RESPIRATORY_TRACT | Status: DC | PRN

## 2015-02-24 MED ORDER — PREDNISONE 10 MG PO TABS
ORAL_TABLET | ORAL | Status: DC
Start: 2015-02-24 — End: 2017-08-26

## 2015-02-24 NOTE — Discharge Summary (Signed)
PATIENT DETAILS Name: Morgan [name] Age: 64 y.o. Sex: female Date of Birth: [birth year] MRN: [id number]. Admitting Physician: Ivor Costa, MD NWG:[id number] N., FNP  Admit Date: 02/22/2015 Discharge date: 02/24/2015  Recommendations for Outpatient Follow-up:  1. Please follow blood cultures-pending at time of discharge.  2. Please repeat Chest XRay in 4-6 weeks 3. Counsel against ongoing tobacco abuse 4. Repeat CBC/BMET at next visit  PRIMARY DISCHARGE DIAGNOSIS:  Principal Problem:   Cough Active Problems:   Chronic lower back pain   Diarrhea   Hypokalemia   Tobacco abuse   Depression      PAST MEDICAL HISTORY: Past Medical History  Diagnosis Date  . Allergy   . Anemia   . Anxiety   . Depression   . Neuromuscular disorder   . Hypertension   . Arthritis     DISCHARGE MEDICATIONS: Current Discharge Medication List    START taking these medications   Details  guaiFENesin (MUCINEX) 600 MG 12 hr tablet Take 1 tablet (600 mg total) by mouth 2 (two) times daily. Qty: 14 tablet, Refills: 0    nicotine (NICODERM CQ - DOSED IN MG/24 HOURS) 21 mg/24hr patch Place 1 patch (21 mg total) onto the skin daily. Qty: 28 patch, Refills: 0    oseltamivir (TAMIFLU) 75 MG capsule Take 1 capsule (75 mg total) by mouth 2 (two) times daily. Qty: 6 capsule, Refills: 0    predniSONE (DELTASONE) 10 MG tablet Take 4 tablets (40 mg) daily for 1 days, then, Take 3 tablets (30 mg) daily for 1 days, then, Take 2 tablets (20 mg) daily for 1 day, then, Take 1 tablets (10 mg) daily for 1 days, then stop Qty: 10 tablet, Refills: 0      CONTINUE these medications which have CHANGED   Details  albuterol (PROVENTIL HFA;VENTOLIN HFA) 108 (90 BASE) MCG/ACT inhaler Inhale 2 puffs into the lungs every 4 (four) hours as needed for wheezing or shortness of breath. Qty: 1 Inhaler, Refills: 0    HYDROcodone-acetaminophen (NORCO/VICODIN) 5-325 MG per tablet Take 1 tablet by mouth every 6 (six)  hours as needed for moderate pain. Qty: 25 tablet, Refills: 0    levofloxacin (LEVAQUIN) 500 MG tablet Take 1 tablet (500 mg total) by mouth daily. Qty: 3 tablet, Refills: 0      CONTINUE these medications which have NOT CHANGED   Details  acetaminophen (TYLENOL) 500 MG tablet Take 1,000 mg by mouth every 6 (six) hours as needed for moderate pain (pain).     ARIPiprazole (ABILIFY) 10 MG tablet Take 10 mg by mouth daily.    DULoxetine (CYMBALTA) 20 MG capsule Take 20 mg by mouth 2 (two) times daily.    gabapentin (NEURONTIN) 100 MG capsule Take 100 mg by mouth 3 (three) times daily.    traZODone (DESYREL) 100 MG tablet Take 200 mg by mouth at bedtime.    Diclofenac Sodium 3 % GEL Apply to affected area up to 4 times daily Qty: 100 g, Refills: 0        ALLERGIES:   Allergies  Allergen Reactions  . Demerol [Meperidine]     Hives     BRIEF HPI:  See H&P, Labs, Consult and Test reports for all details in brief, patient was admitted for evaluation of fever, with nausea/vomiting and cough. She was found to have SIR's and admitted   CONSULTATIONS:   None  PERTINENT RADIOLOGIC STUDIES: Dg Chest 2 View  02/22/2015   CLINICAL DATA:  Chills.  Body  aches.  Fever.  EXAM: CHEST  2 VIEW  COMPARISON:  12/11/2014.  FINDINGS: The chest radiograph appears very similar to the prior examination. Cardiopericardial silhouette is within normal limits. Mediastinal contours are normal. There is diffuse prominence of the interstitial markings. The pattern is suggestive of interstitial lung disease. Interstitial pulmonary edema is less likely. No Kerley B-lines are identified. There is no focal consolidation.  IMPRESSION: Similar appearance of the chest to the prior exam of 12/11/2014. Diffuse interstitial opacities are present, which may represent atypical infection such as mycoplasma or chronic interstitial lung disease.   Electronically Signed   By: Dereck Ligas M.D.   On: 02/22/2015 20:45      PERTINENT LAB RESULTS: CBC:  Recent Labs  02/22/15 1803 02/23/15 0829  WBC 5.2 4.5  HGB 10.4* 9.8*  HCT 32.6* 30.4*  PLT 246 215   CMET CMP     Component Value Date/Time   NA 135 02/23/2015 0829   K 3.6 02/23/2015 0829   CL 101 02/23/2015 0829   CO2 26 02/23/2015 0829   GLUCOSE 121* 02/23/2015 0829   BUN 12 02/23/2015 0829   CREATININE 0.74 02/23/2015 0829   CALCIUM 7.6* 02/23/2015 0829   PROT 6.7 02/22/2015 1843   ALBUMIN 3.4* 02/22/2015 1843   AST 23 02/22/2015 1843   ALT 11 02/22/2015 1843   ALKPHOS 91 02/22/2015 1843   BILITOT 0.4 02/22/2015 1843   GFRNONAA 90* 02/23/2015 0829   GFRAA >90 02/23/2015 0829    GFR Estimated Creatinine Clearance: 87.3 mL/min (by C-G formula based on Cr of 0.74).  Recent Labs  02/22/15 1843  LIPASE 13   No results for input(s): CKTOTAL, CKMB, CKMBINDEX, TROPONINI in the last 72 hours. Invalid input(s): POCBNP No results for input(s): DDIMER in the last 72 hours. No results for input(s): HGBA1C in the last 72 hours. No results for input(s): CHOL, HDL, LDLCALC, TRIG, CHOLHDL, LDLDIRECT in the last 72 hours. No results for input(s): TSH, T4TOTAL, T3FREE, THYROIDAB in the last 72 hours.  Invalid input(s): FREET3 No results for input(s): VITAMINB12, FOLATE, FERRITIN, TIBC, IRON, RETICCTPCT in the last 72 hours. Coags:  Recent Labs  02/23/15 0251  INR 1.01   Microbiology: Recent Results (from the past 240 hour(s))  Clostridium Difficile by PCR     Status: None   Collection Time: 02/23/15  2:06 PM  Result Value Ref Range Status   C difficile by pcr NEGATIVE NEGATIVE Final     BRIEF HOSPITAL COURSE:  Systemic inflammatory response syndrome: Likely secondary to influenza A. continue Tamiflu on discharge.Bood cultures still pending at time of discharge, have asked patient to follow up with PCP.  Active Problems:  Influenza A: Presented with fever, myalgias, cough.Started empirically onTamiflu. Clinically much  improved, now afebrile. Vomiting/diarrrhea have resolved.Have encouraged Flu vaccine in the future   Atypical PNA: suspect from above, but will continue with a short course of Levaquin. Have asked patient to discuss with PCP about repeating a CXR in 4-6 weeks. As noted above, afebrile and clinically improved at time of discharge.    COPD: Started on  Bronchodilators and solumedrol on admission. Lungs clear on exam , rapidly taper her steroids on discharge. Doubt she has a COPD exacerbation.   Diarrhea: Likely secondary to above.Resolved. C Diff PCR was negative   Hypokalemia: Likely secondary to diarrhea. Resolved. Monitor periodically as outpatient.   Depression: Stable. No suicidal or homicidal ideations. Continue Abilify and Cymbalta   Tobacco abuse: Continue with transdermal nicotine on discharge,she  is interested in quitting. Counseled.   Dysuria: UA not consistent with UTI. Will be on Levaquin for a few days anyways   Chronic lower back pain: Supportive care. Continue with Norco.   TODAY-DAY OF DISCHARGE:  Subjective:   Morgan [name] today has no headache,no chest abdominal pain,no new weakness tingling or numbness, feels much better wants to go home today. No fever, vomiting and diarrhea. She has been requesting discharge since yesterday  Objective:   Blood pressure 155/87, pulse 64, temperature 98 F (36.7 C), temperature source Oral, resp. rate 18, height 5\' 10"  (1.778 m), weight 84.596 kg (186 lb 8 oz), SpO2 97 %.  Intake/Output Summary (Last 24 hours) at 02/24/15 0806 Last data filed at 02/23/15 1828  Gross per 24 hour  Intake    360 ml  Output      0 ml  Net    360 ml   Filed Weights   02/23/15 0107  Weight: 84.596 kg (186 lb 8 oz)    Exam Awake Alert, Oriented *3, No new F.N deficits, Normal affect [place].AT,PERRAL Supple Neck,No JVD, No cervical lymphadenopathy appriciated.  Symmetrical Chest wall movement, Good air movement bilaterally, CTAB RRR,No  Gallops,Rubs or new Murmurs, No Parasternal Heave +ve B.Sounds, Abd Soft, Non tender, No organomegaly appriciated, No rebound -guarding or rigidity. No Cyanosis, Clubbing or edema, No new Rash or bruise  DISCHARGE CONDITION: Stable  DISPOSITION: Home  DISCHARGE INSTRUCTIONS:    Activity:  As tolerated   Diet recommendation: Regular Diet   Discharge Instructions    Call MD for:  persistant nausea and vomiting    Complete by:  As directed      Call MD for:  temperature >100.4    Complete by:  As directed      Diet - low sodium heart healthy    Complete by:  As directed      Increase activity slowly    Complete by:  As directed            Follow-up Information    Follow up with Vonna Drafts., FNP On 03/08/2015.   Specialty:  Nurse Practitioner   Why:  [facility] March 22 at 9:45am   Contact information:   [address] [place]  [phone]        Total Time spent on discharge equals 45 minutes.  SignedOren Binet 02/24/2015 8:06 AM

## 2015-02-24 NOTE — Progress Notes (Signed)
Morgan [name] to be D/C'd Home per MD order.  Discussed prescriptions and follow up appointments with the patient. Prescriptions given to patient, medication list explained in detail. Pt verbalized understanding.    Medication List    TAKE these medications        acetaminophen 500 MG tablet  Commonly known as:  TYLENOL  Take 1,000 mg by mouth every 6 (six) hours as needed for moderate pain (pain).     albuterol 108 (90 BASE) MCG/ACT inhaler  Commonly known as:  PROVENTIL HFA;VENTOLIN HFA  Inhale 2 puffs into the lungs every 4 (four) hours as needed for wheezing or shortness of breath.     ARIPiprazole 10 MG tablet  Commonly known as:  ABILIFY  Take 10 mg by mouth daily.     Diclofenac Sodium 3 % Gel  Apply to affected area up to 4 times daily     DULoxetine 20 MG capsule  Commonly known as:  CYMBALTA  Take 20 mg by mouth 2 (two) times daily.     gabapentin 100 MG capsule  Commonly known as:  NEURONTIN  Take 100 mg by mouth 3 (three) times daily.     guaiFENesin 600 MG 12 hr tablet  Commonly known as:  MUCINEX  Take 1 tablet (600 mg total) by mouth 2 (two) times daily.     HYDROcodone-acetaminophen 5-325 MG per tablet  Commonly known as:  NORCO/VICODIN  Take 1 tablet by mouth every 6 (six) hours as needed for moderate pain.     levofloxacin 500 MG tablet  Commonly known as:  LEVAQUIN  Take 1 tablet (500 mg total) by mouth daily.     nicotine 21 mg/24hr patch  Commonly known as:  NICODERM CQ - dosed in mg/24 hours  Place 1 patch (21 mg total) onto the skin daily.     oseltamivir 75 MG capsule  Commonly known as:  TAMIFLU  Take 1 capsule (75 mg total) by mouth 2 (two) times daily.     predniSONE 10 MG tablet  Commonly known as:  DELTASONE  - Take 4 tablets (40 mg) daily for 1 days, then,  - Take 3 tablets (30 mg) daily for 1 days, then,  - Take 2 tablets (20 mg) daily for 1 day, then,  - Take 1 tablets (10 mg) daily for 1 days, then stop     traZODone 100 MG  tablet  Commonly known as:  DESYREL  Take 200 mg by mouth at bedtime.        Filed Vitals:   02/24/15 0738  BP: 155/87  Pulse: 64  Temp: 98 F (36.7 C)  Resp: 18    Skin clean, dry and intact without evidence of skin break down, no evidence of skin tears noted. IV catheter discontinued intact. Site without signs and symptoms of complications. Dressing and pressure applied. Pt denies pain at this time. No complaints noted.  An After Visit Summary was printed and given to the patient. Patient escorted via [place], and D/C home via private auto.  Lolita Rieger 02/24/2015 9:56 AM

## 2015-02-25 LAB — GI PATHOGEN PANEL BY PCR, STOOL
C DIFFICILE TOXIN A/B: NOT DETECTED
CAMPYLOBACTER BY PCR: NOT DETECTED
CRYPTOSPORIDIUM BY PCR: NOT DETECTED
E COLI 0157 BY PCR: NOT DETECTED
E coli (ETEC) LT/ST: NOT DETECTED
E coli (STEC): NOT DETECTED
G LAMBLIA BY PCR: NOT DETECTED
Norovirus GI/GII: NOT DETECTED
Rotavirus A by PCR: NOT DETECTED
SALMONELLA BY PCR: NOT DETECTED
Shigella by PCR: NOT DETECTED

## 2015-02-25 LAB — URINE CULTURE: Colony Count: 9000

## 2015-02-25 LAB — LEGIONELLA ANTIGEN, URINE

## 2015-03-01 LAB — CULTURE, BLOOD (ROUTINE X 2)
CULTURE: NO GROWTH
Culture: NO GROWTH

## 2015-05-05 ENCOUNTER — Other Ambulatory Visit: Payer: Self-pay | Admitting: Nurse Practitioner

## 2015-05-05 DIAGNOSIS — D241 Benign neoplasm of right breast: Secondary | ICD-10-CM

## 2015-05-10 ENCOUNTER — Ambulatory Visit
Admission: RE | Admit: 2015-05-10 | Discharge: 2015-05-10 | Disposition: A | Discharge status: Home / Self Care | Payer: Medicare Other | Source: Ambulatory Visit | Attending: Nurse Practitioner | Admitting: Nurse Practitioner

## 2015-05-10 DIAGNOSIS — D241 Benign neoplasm of right breast: Secondary | ICD-10-CM

## 2015-06-13 ENCOUNTER — Emergency Department (INDEPENDENT_AMBULATORY_CARE_PROVIDER_SITE_OTHER)
Admission: EM | Admit: 2015-06-13 | Discharge: 2015-06-13 | Disposition: A | Discharge status: Home / Self Care | Payer: Medicare Other | Source: Home / Self Care | Attending: Family Medicine | Admitting: Family Medicine

## 2015-06-13 ENCOUNTER — Encounter (HOSPITAL_COMMUNITY): Payer: Self-pay | Admitting: Emergency Medicine

## 2015-06-13 DIAGNOSIS — J069 Acute upper respiratory infection, unspecified: Secondary | ICD-10-CM | POA: Diagnosis not present

## 2015-06-13 MED ORDER — AZITHROMYCIN 250 MG PO TABS: ORAL_TABLET | ORAL | Status: DC

## 2015-06-13 MED ORDER — IPRATROPIUM BROMIDE 0.06 % NA SOLN: 2.0000 | Freq: Four times a day (QID) | NASAL | Status: DC

## 2015-06-13 NOTE — ED Notes (Signed)
Sore throat.

## 2015-06-13 NOTE — ED Provider Notes (Signed)
CSN: [id number]     Arrival date & time 06/13/15  1613 History   First MD Initiated Contact with Patient 06/13/15 1658     No chief complaint on file.  (Consider location/radiation/quality/duration/timing/severity/associated sxs/prior Treatment) Patient is a 64 y.o. female presenting with URI. The history is provided by the patient.  URI Presenting symptoms: congestion, facial pain, rhinorrhea and sore throat   Presenting symptoms: no fever   Severity:  Mild Onset quality:  Gradual Duration:  3 days Chronicity:  New Ineffective treatments:  OTC medications Associated symptoms: sinus pain   Risk factors: no sick contacts     Past Medical History  Diagnosis Date  . Allergy   . Anemia   . Anxiety   . Depression   . Neuromuscular disorder   . Hypertension   . Arthritis    Past Surgical History  Procedure Laterality Date  . Appendectomy    . Breast surgery    . Tubal ligation    . Abdominal hysterectomy    . Knee surgery  2005    2 times left   Family History  Problem Relation Age of Onset  . Cancer Mother     breast and ovarian  . Hypertension Father   . Hyperlipidemia Father   . Cancer Maternal Grandmother     breast  . Cancer Paternal Grandmother   . Cancer Paternal Grandfather   . Cancer Maternal Aunt     breast  . Cancer Maternal Aunt     breast   History  Substance Use Topics  . Smoking status: Current Every Day Smoker    Types: Cigarettes  . Smokeless tobacco: Never Used  . Alcohol Use: No   OB History    No data available     Review of Systems  Constitutional: Negative.  Negative for fever.  HENT: Positive for congestion, postnasal drip, rhinorrhea and sore throat.   Respiratory: Negative.   Cardiovascular: Negative.     Allergies  Demerol  Home Medications   Prior to Admission medications   Medication Sig Start Date End Date Taking? Authorizing Provider  acetaminophen (TYLENOL) 500 MG tablet Take 1,000 mg by mouth every 6 (six) hours  as needed for moderate pain (pain).     Historical Provider, MD  albuterol (PROVENTIL HFA;VENTOLIN HFA) 108 (90 BASE) MCG/ACT inhaler Inhale 2 puffs into the lungs every 4 (four) hours as needed for wheezing or shortness of breath. 02/24/15   Shanker Kristeen Mans, MD  ARIPiprazole (ABILIFY) 10 MG tablet Take 10 mg by mouth daily.    Historical Provider, MD  azithromycin (ZITHROMAX Z-PAK) 250 MG tablet Take as directed on pack 06/13/15   Billy Fischer, MD  Diclofenac Sodium 3 % GEL Apply to affected area up to 4 times daily Patient not taking: Reported on 12/11/2014 05/26/14   Waldemar Dickens, MD  DULoxetine (CYMBALTA) 20 MG capsule Take 20 mg by mouth 2 (two) times daily.    Historical Provider, MD  gabapentin (NEURONTIN) 100 MG capsule Take 100 mg by mouth 3 (three) times daily.    Historical Provider, MD  guaiFENesin (MUCINEX) 600 MG 12 hr tablet Take 1 tablet (600 mg total) by mouth 2 (two) times daily. 02/24/15   Shanker Kristeen Mans, MD  HYDROcodone-acetaminophen (NORCO/VICODIN) 5-325 MG per tablet Take 1 tablet by mouth every 6 (six) hours as needed for moderate pain. 02/24/15   Shanker Kristeen Mans, MD  ipratropium (ATROVENT) 0.06 % nasal spray Place 2 sprays into both nostrils 4 (  four) times daily. 06/13/15   Billy Fischer, MD  levofloxacin (LEVAQUIN) 500 MG tablet Take 1 tablet (500 mg total) by mouth daily. 02/24/15   Shanker Kristeen Mans, MD  nicotine (NICODERM CQ - DOSED IN MG/24 HOURS) 21 mg/24hr patch Place 1 patch (21 mg total) onto the skin daily. 02/24/15   Shanker Kristeen Mans, MD  oseltamivir (TAMIFLU) 75 MG capsule Take 1 capsule (75 mg total) by mouth 2 (two) times daily. 02/24/15   Shanker Kristeen Mans, MD  predniSONE (DELTASONE) 10 MG tablet Take 4 tablets (40 mg) daily for 1 days, then, Take 3 tablets (30 mg) daily for 1 days, then, Take 2 tablets (20 mg) daily for 1 day, then, Take 1 tablets (10 mg) daily for 1 days, then stop 02/24/15   Jonetta Osgood, MD  traZODone (DESYREL) 100 MG tablet Take  200 mg by mouth at bedtime.    Historical Provider, MD   BP 176/94 mmHg  Pulse 66  Temp(Src) 98.2 F (36.8 C) (Oral)  Resp 20  SpO2 100% Physical Exam  Constitutional: She is oriented to person, place, and time. She appears well-developed and well-nourished. No distress.  HENT:  Right Ear: External ear normal.  Left Ear: External ear normal.  Nose: Mucosal edema and rhinorrhea present. Right sinus exhibits maxillary sinus tenderness. Left sinus exhibits maxillary sinus tenderness.  Mouth/Throat: Oropharynx is clear and moist.  Eyes: Pupils are equal, round, and reactive to light.  Neck: Normal range of motion. Neck supple.  Lymphadenopathy:    She has no cervical adenopathy.  Neurological: She is alert and oriented to person, place, and time.  Skin: Skin is warm and dry.  Nursing note and vitals reviewed.   ED Course  Procedures (including critical care time) Labs Review Labs Reviewed - No data to display  Imaging Review No results found.   MDM   1. URI (upper respiratory infection)       Billy Fischer, MD 06/13/15 1714

## 2015-06-21 IMAGING — CR DG KNEE COMPLETE 4+V*R*
4 series · 4 of 4 positions shown · non-contrast
Comparison: None.

CLINICAL DATA: Recent fall with knee pain

EXAM:
RIGHT KNEE - COMPLETE 4+ VIEW

[t knee ap right]
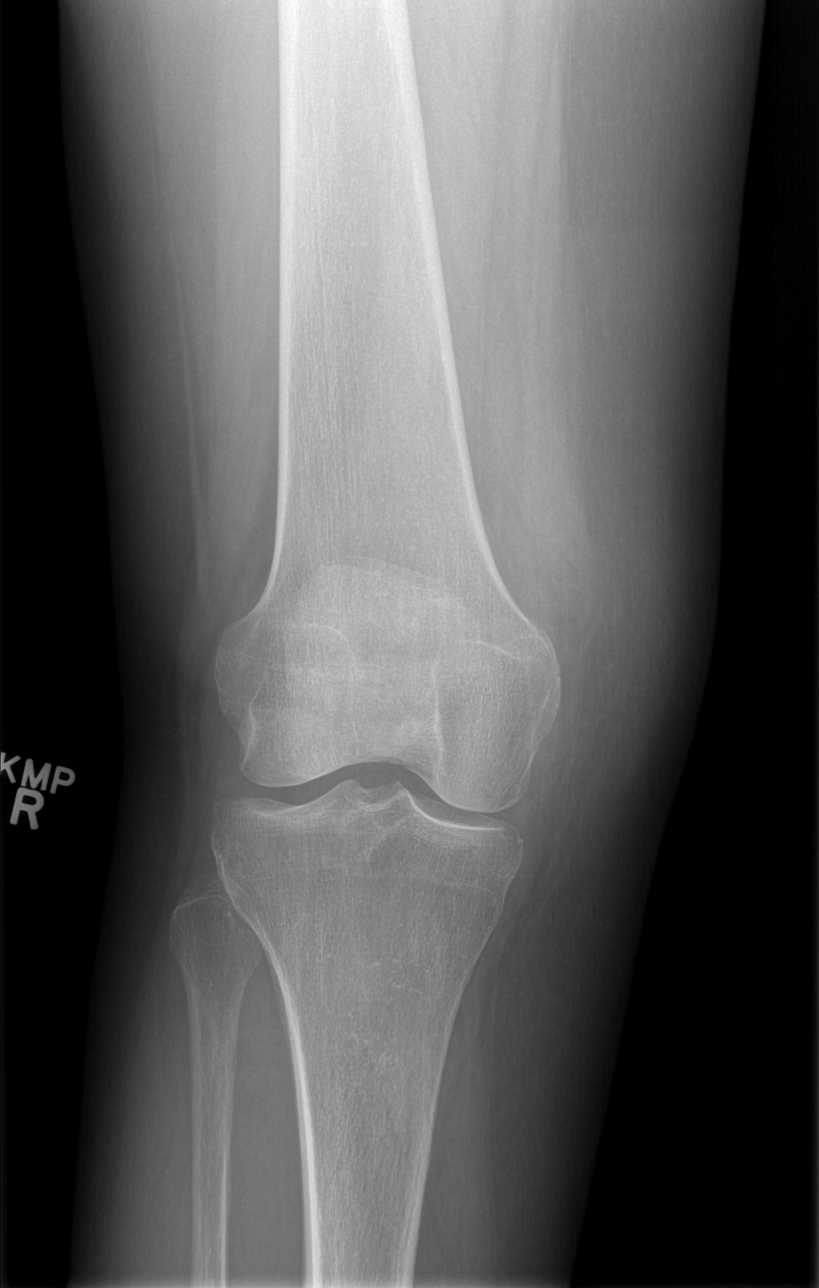

[t knee oblique right (1 of 2)]
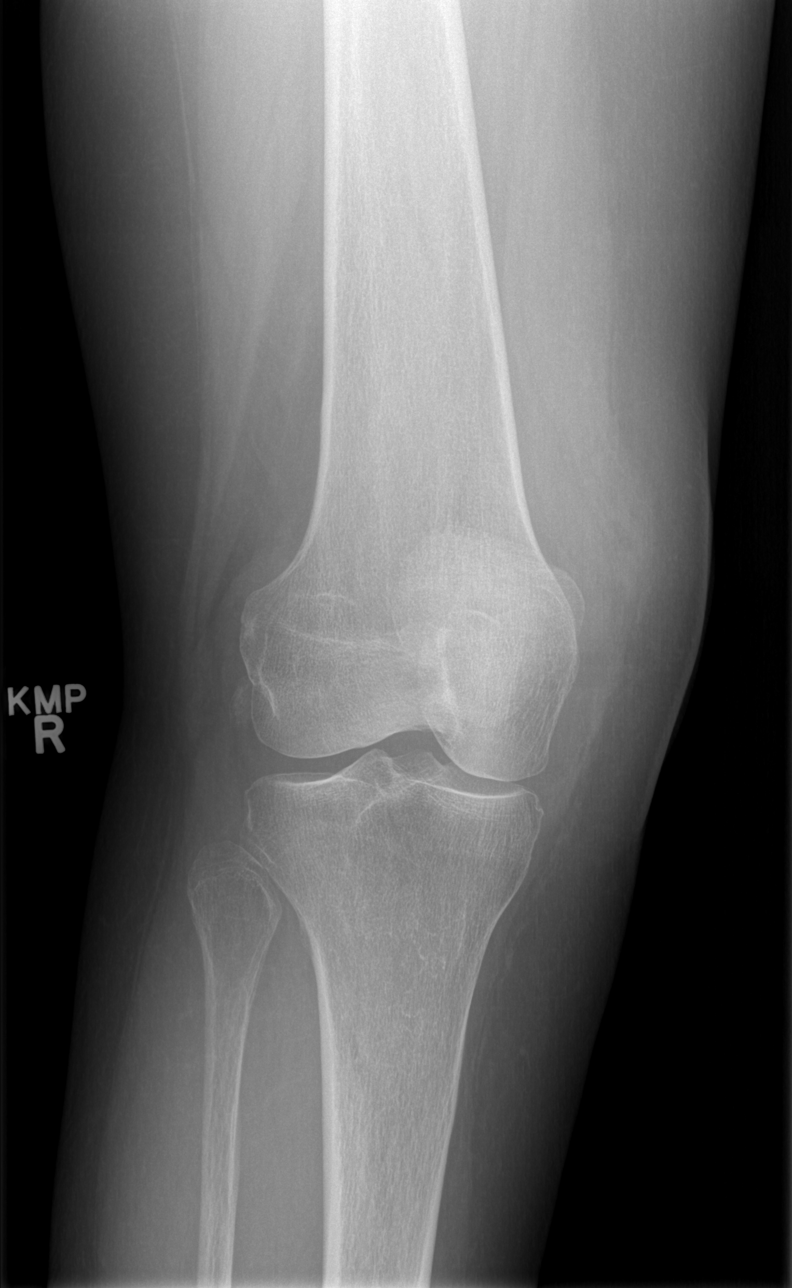

[t knee oblique right (2 of 2)]
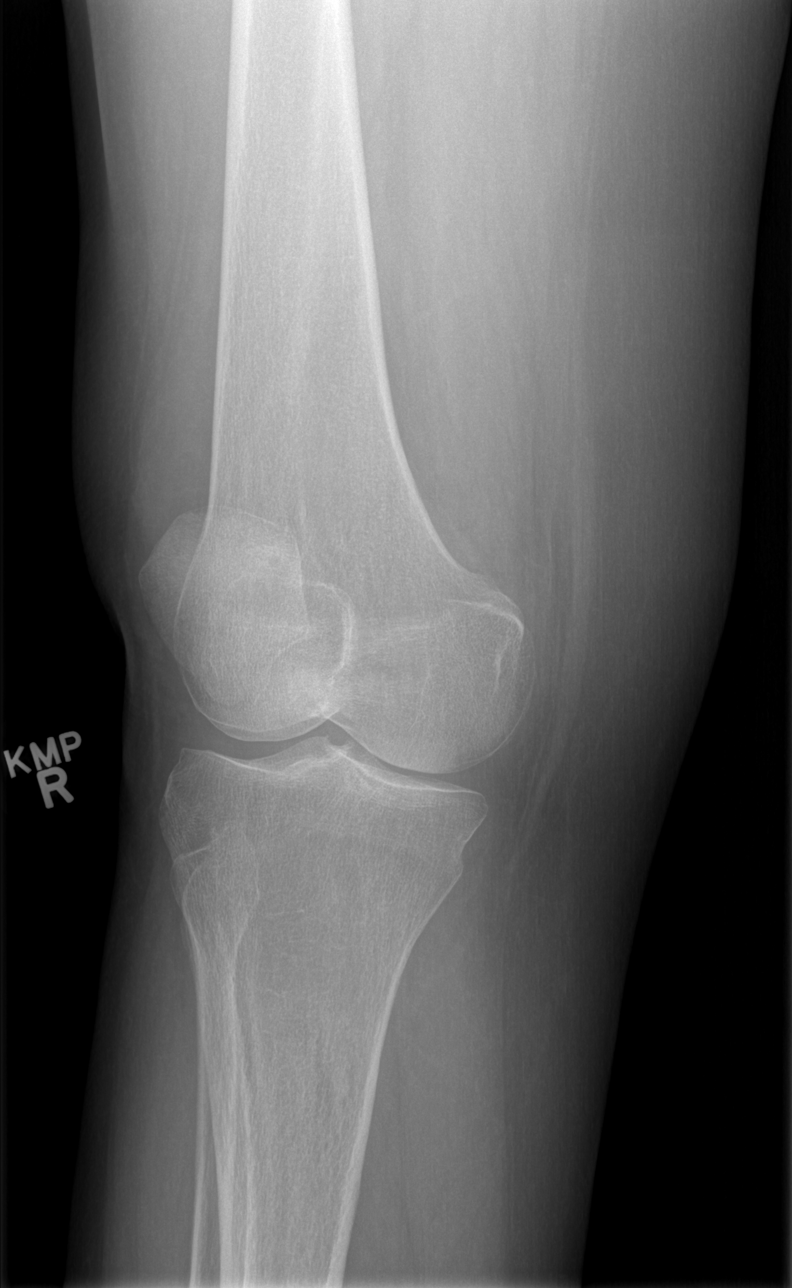

[t knee lat right]
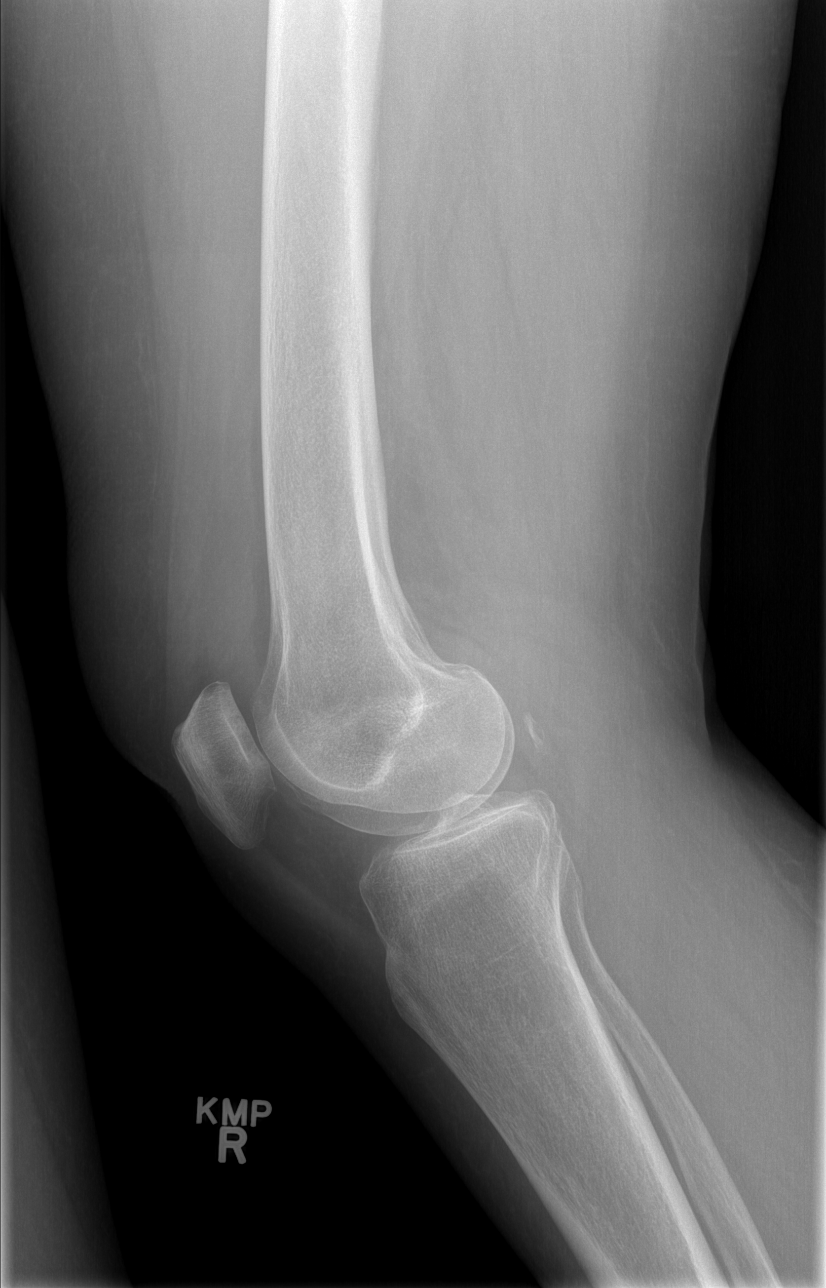

[4 of 4 positions shown; findings below may reference images not displayed]

FINDINGS: No significant degenerative change is seen no fracture is noted.
There does appear to be a small right knee joint effusion present.
IMPRESSION: Small right knee joint effusion.  No fracture.

## 2015-08-23 IMAGING — US US BREAST*R* LIMITED INC AXILLA
1 series · 4 of 4 positions shown · non-contrast
Comparison: Prior ultrasounds dated 09/04/2012, 04/06/2013,
10/20/2013 and 04/19/2014.

CLINICAL DATA: Short-term interval followup of a probable
fibroadenoma or intramammary lymph node in the right breast.

EXAM:
ULTRASOUND OF THE RIGHT BREAST

[Series 1: us breast*right* limited inc axilla · 4 of 4 slices shown]
[im 1/4]
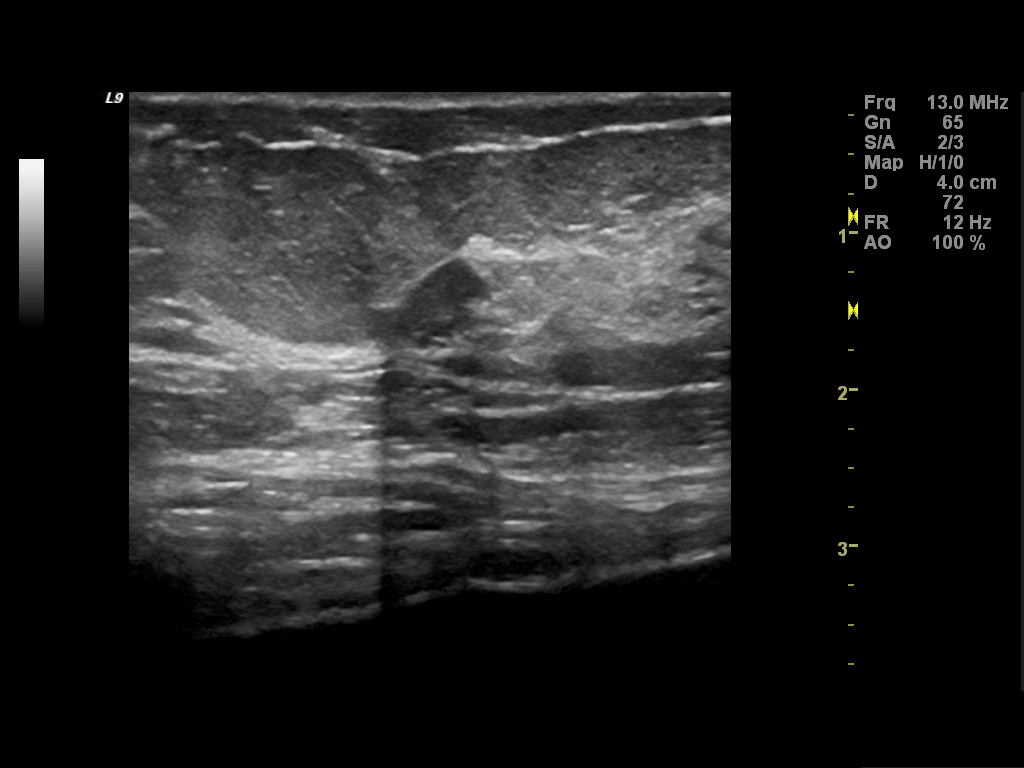
[im 2/4]
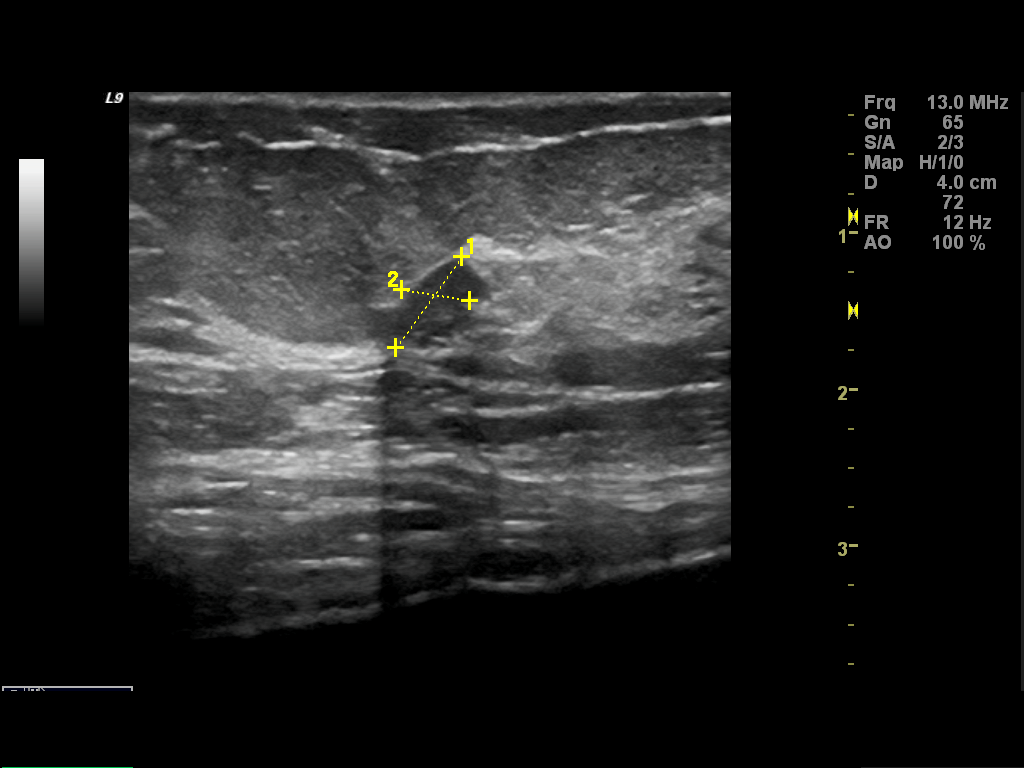
[im 3/4]
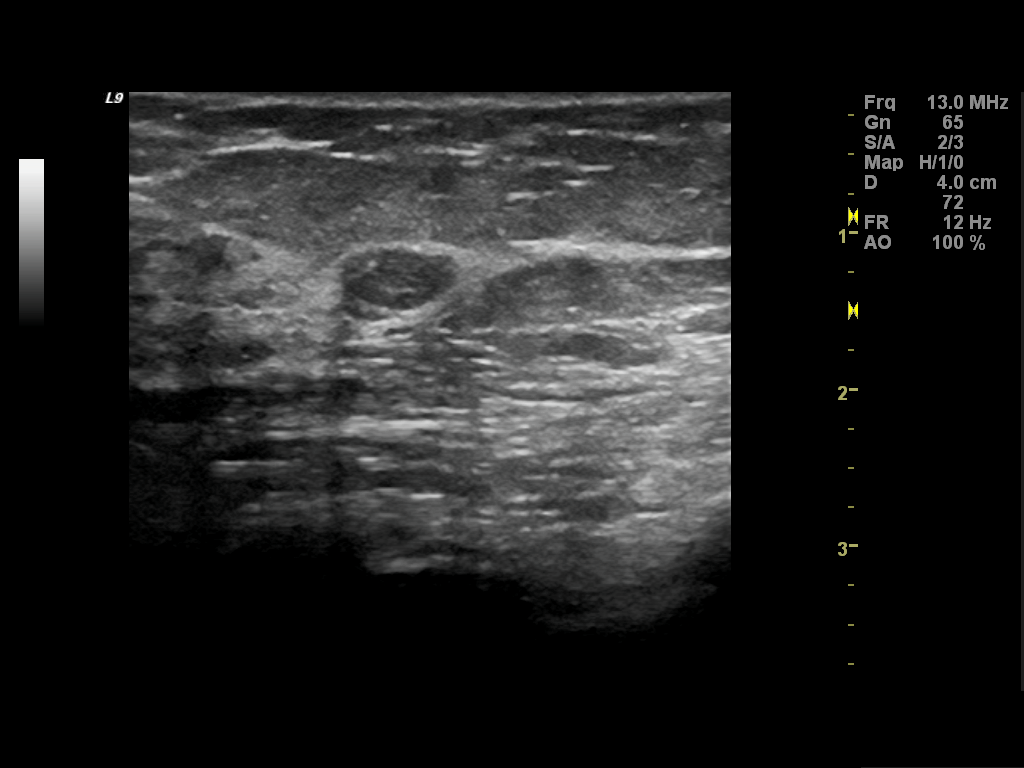
[im 4/4]
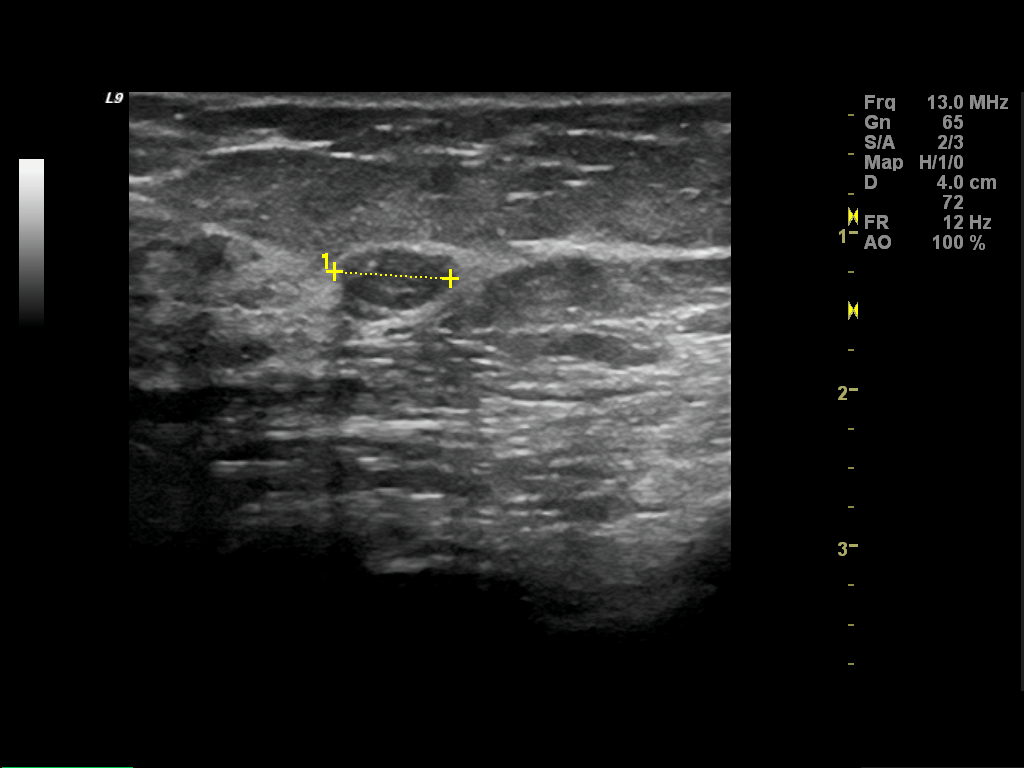

[4 of 4 positions shown; findings below may reference images not displayed]

FINDINGS: On physical exam, I do not palpate a mass in the right breast.

Ultrasound is performed, showing a well-circumscribed, hypoechoic
ovoid lesion with central fat measuring 7 x 4 x 7 mm. On the
ultrasound dated 09/03/2012 it measured 9 x 5 x 8 mm. It appears
stable and is felt to be benign.
IMPRESSION: Stable benign appearing lesion in the right breast likely
representing a fibroadenoma or intramammary lymph node.

RECOMMENDATION:
Bilateral diagnostic mammogram and ultrasound in Friday April, 2015 is
recommended. If the lesion is stable at this time yearly screening
mammography would be recommended.

I have discussed the findings and recommendations with the patient.
Results were also provided in writing at the conclusion of the
visit. If applicable, a reminder letter will be sent to the patient
regarding the next appointment.

BI-RADS CATEGORY  3: Probably benign finding(s) - short interval
follow-up suggested.

## 2015-09-01 ENCOUNTER — Emergency Department (HOSPITAL_COMMUNITY)
Admission: EM | Admit: 2015-09-01 | Discharge: 2015-09-02 | Disposition: A | Discharge status: Home / Self Care | Payer: Medicare Other | Attending: Emergency Medicine | Admitting: Emergency Medicine

## 2015-09-01 ENCOUNTER — Encounter (HOSPITAL_COMMUNITY): Payer: Self-pay | Admitting: Emergency Medicine

## 2015-09-01 DIAGNOSIS — M1990 Unspecified osteoarthritis, unspecified site: Secondary | ICD-10-CM | POA: Insufficient documentation

## 2015-09-01 DIAGNOSIS — Y998 Other external cause status: Secondary | ICD-10-CM | POA: Diagnosis not present

## 2015-09-01 DIAGNOSIS — Y9289 Other specified places as the place of occurrence of the external cause: Secondary | ICD-10-CM | POA: Diagnosis not present

## 2015-09-01 DIAGNOSIS — Z862 Personal history of diseases of the blood and blood-forming organs and certain disorders involving the immune mechanism: Secondary | ICD-10-CM | POA: Insufficient documentation

## 2015-09-01 DIAGNOSIS — I10 Essential (primary) hypertension: Secondary | ICD-10-CM | POA: Diagnosis not present

## 2015-09-01 DIAGNOSIS — Z79899 Other long term (current) drug therapy: Secondary | ICD-10-CM | POA: Diagnosis not present

## 2015-09-01 DIAGNOSIS — Z792 Long term (current) use of antibiotics: Secondary | ICD-10-CM | POA: Insufficient documentation

## 2015-09-01 DIAGNOSIS — W57XXXA Bitten or stung by nonvenomous insect and other nonvenomous arthropods, initial encounter: Secondary | ICD-10-CM | POA: Insufficient documentation

## 2015-09-01 DIAGNOSIS — T39395A Adverse effect of other nonsteroidal anti-inflammatory drugs [NSAID], initial encounter: Secondary | ICD-10-CM | POA: Insufficient documentation

## 2015-09-01 DIAGNOSIS — F329 Major depressive disorder, single episode, unspecified: Secondary | ICD-10-CM | POA: Insufficient documentation

## 2015-09-01 DIAGNOSIS — K2970 Gastritis, unspecified, without bleeding: Secondary | ICD-10-CM | POA: Diagnosis not present

## 2015-09-01 DIAGNOSIS — S30861A Insect bite (nonvenomous) of abdominal wall, initial encounter: Secondary | ICD-10-CM | POA: Diagnosis not present

## 2015-09-01 DIAGNOSIS — F419 Anxiety disorder, unspecified: Secondary | ICD-10-CM | POA: Diagnosis not present

## 2015-09-01 DIAGNOSIS — K2960 Other gastritis without bleeding: Secondary | ICD-10-CM

## 2015-09-01 DIAGNOSIS — M791 Myalgia, unspecified site: Secondary | ICD-10-CM

## 2015-09-01 DIAGNOSIS — Z720 Tobacco use: Secondary | ICD-10-CM | POA: Diagnosis not present

## 2015-09-01 DIAGNOSIS — R52 Pain, unspecified: Secondary | ICD-10-CM | POA: Diagnosis present

## 2015-09-01 DIAGNOSIS — Y9389 Activity, other specified: Secondary | ICD-10-CM | POA: Diagnosis not present

## 2015-09-01 LAB — CBC WITH DIFFERENTIAL/PLATELET
Basophils Absolute: 0 10*3/uL (ref 0.0–0.1)
Basophils Relative: 0 %
EOS ABS: 0.2 10*3/uL (ref 0.0–0.7)
Eosinophils Relative: 2 %
HCT: 35.5 % — ABNORMAL LOW (ref 36.0–46.0)
Hemoglobin: 11.6 g/dL — ABNORMAL LOW (ref 12.0–15.0)
Lymphocytes Relative: 46 %
Lymphs Abs: 3.2 10*3/uL (ref 0.7–4.0)
MCH: 28.3 pg (ref 26.0–34.0)
MCHC: 32.7 g/dL (ref 30.0–36.0)
MCV: 86.6 fL (ref 78.0–100.0)
MONO ABS: 0.4 10*3/uL (ref 0.1–1.0)
Monocytes Relative: 5 %
NEUTROS PCT: 47 %
Neutro Abs: 3.2 10*3/uL (ref 1.7–7.7)
Platelets: 264 10*3/uL (ref 150–400)
RBC: 4.1 MIL/uL (ref 3.87–5.11)
RDW: 15.5 % (ref 11.5–15.5)
WBC: 6.9 10*3/uL (ref 4.0–10.5)

## 2015-09-01 LAB — COMPREHENSIVE METABOLIC PANEL
ALBUMIN: 3.9 g/dL (ref 3.5–5.0)
ALT: 14 U/L (ref 14–54)
ANION GAP: 7 (ref 5–15)
AST: 23 U/L (ref 15–41)
Alkaline Phosphatase: 82 U/L (ref 38–126)
BUN: 12 mg/dL (ref 6–20)
CO2: 27 mmol/L (ref 22–32)
Calcium: 9.3 mg/dL (ref 8.9–10.3)
Chloride: 106 mmol/L (ref 101–111)
Creatinine, Ser: 0.85 mg/dL (ref 0.44–1.00)
GFR calc Af Amer: >60 mL/min (ref >60)
GFR calc non Af Amer: >60 mL/min (ref >60)
GLUCOSE: 105 mg/dL — AB (ref 65–99)
POTASSIUM: 3.3 mmol/L — AB (ref 3.5–5.1)
SODIUM: 140 mmol/L (ref 135–145)
Total Bilirubin: 0.4 mg/dL (ref 0.3–1.2)
Total Protein: 6.6 g/dL (ref 6.5–8.1)

## 2015-09-01 NOTE — ED Notes (Signed)
Pt c/o generalized body aches with nausea and vomiting.  St's she pulled a tick off her abd on Mon or Tues

## 2015-09-01 NOTE — ED Provider Notes (Signed)
CSN: [id number]     Arrival date & time 09/01/15  2117 History   This chart was scribed for Julianne Rice, MD by Forrestine Him, ED Scribe. This patient was seen in room A10C/A10C and the patient's care was started 12:07 AM.   Chief Complaint  Patient presents with  . Generalized Body Aches   The history is provided by the patient. No language interpreter was used.    HPI Comments: Morgan [name] is a 64 y.o. female without any pertinent past medical history who presents to the Emergency Department complaining of generalized body aches onset today. Nausea, chills, ongoing diarrhea, generalized HA, and 6 episodes of vomiting also reported. OTC Ibuporofren attempted prior to arrival without any improvement for symptoms. Denies any recent fever, dysuria, urinary frequency, chest pain, or shortness of breath. Pt states she pulled an attached tick off of her left lower abdomen 3 days ago. Alcohol applied to the area afterwards, however, she reports some ongoing redness to site. 5000 mg of B12 started today in hopes of "building up my energy". Pt with known allergy to Demerol.  Past Medical History  Diagnosis Date  . Allergy   . Anemia   . Anxiety   . Depression   . Neuromuscular disorder   . Hypertension   . Arthritis    Past Surgical History  Procedure Laterality Date  . Appendectomy    . Breast surgery    . Tubal ligation    . Abdominal hysterectomy    . Knee surgery  2005    2 times left   Family History  Problem Relation Age of Onset  . Cancer Mother     breast and ovarian  . Hypertension Father   . Hyperlipidemia Father   . Cancer Maternal Grandmother     breast  . Cancer Paternal Grandmother   . Cancer Paternal Grandfather   . Cancer Maternal Aunt     breast  . Cancer Maternal Aunt     breast   Social History  Substance Use Topics  . Smoking status: Current Every Day Smoker    Types: Cigarettes  . Smokeless tobacco: Never Used  . Alcohol Use: No   OB History     No data available     Review of Systems  Constitutional: Positive for chills and fatigue. Negative for fever.  HENT: Negative for sore throat.   Eyes: Negative for photophobia and visual disturbance.  Respiratory: Negative for cough and shortness of breath.   Cardiovascular: Negative for chest pain.  Gastrointestinal: Positive for nausea, vomiting and abdominal pain. Negative for diarrhea, constipation and blood in stool.  Genitourinary: Negative for dysuria and frequency.  Musculoskeletal: Positive for myalgias and back pain. Negative for neck pain and neck stiffness.  Skin: Negative for rash.  Neurological: Positive for headaches. Negative for dizziness, syncope, weakness, light-headedness and numbness.  Psychiatric/Behavioral: Negative for confusion.  All other systems reviewed and are negative.     Allergies  Demerol  Home Medications   Prior to Admission medications   Medication Sig Start Date End Date Taking? Authorizing Provider  acetaminophen (TYLENOL) 500 MG tablet Take 1,000 mg by mouth every 6 (six) hours as needed for moderate pain (pain).     Historical Provider, MD  albuterol (PROVENTIL HFA;VENTOLIN HFA) 108 (90 BASE) MCG/ACT inhaler Inhale 2 puffs into the lungs every 4 (four) hours as needed for wheezing or shortness of breath. 02/24/15   Shanker Kristeen Mans, MD  ARIPiprazole (ABILIFY) 10 MG tablet Take  10 mg by mouth daily.    Historical Provider, MD  azithromycin (ZITHROMAX Z-PAK) 250 MG tablet Take as directed on pack 06/13/15   Billy Fischer, MD  Diclofenac Sodium 3 % GEL Apply to affected area up to 4 times daily Patient not taking: Reported on 12/11/2014 05/26/14   Waldemar Dickens, MD  doxycycline (VIBRAMYCIN) 100 MG capsule Take 1 capsule (100 mg total) by mouth 2 (two) times daily. One po bid x 7 days 09/02/15   Julianne Rice, MD  DULoxetine (CYMBALTA) 20 MG capsule Take 20 mg by mouth 2 (two) times daily.    Historical Provider, MD  gabapentin (NEURONTIN) 100  MG capsule Take 100 mg by mouth 3 (three) times daily.    Historical Provider, MD  guaiFENesin (MUCINEX) 600 MG 12 hr tablet Take 1 tablet (600 mg total) by mouth 2 (two) times daily. 02/24/15   Shanker Kristeen Mans, MD  HYDROcodone-acetaminophen (NORCO/VICODIN) 5-325 MG per tablet Take 1 tablet by mouth every 6 (six) hours as needed for severe pain. 09/02/15   Julianne Rice, MD  ipratropium (ATROVENT) 0.06 % nasal spray Place 2 sprays into both nostrils 4 (four) times daily. 06/13/15   Billy Fischer, MD  levofloxacin (LEVAQUIN) 500 MG tablet Take 1 tablet (500 mg total) by mouth daily. 02/24/15   Shanker Kristeen Mans, MD  nicotine (NICODERM CQ - DOSED IN MG/24 HOURS) 21 mg/24hr patch Place 1 patch (21 mg total) onto the skin daily. 02/24/15   Shanker Kristeen Mans, MD  ondansetron (ZOFRAN ODT) 4 MG disintegrating tablet 4mg  ODT q4 hours prn nausea/vomit 09/02/15   Julianne Rice, MD  oseltamivir (TAMIFLU) 75 MG capsule Take 1 capsule (75 mg total) by mouth 2 (two) times daily. 02/24/15   Shanker Kristeen Mans, MD  predniSONE (DELTASONE) 10 MG tablet Take 4 tablets (40 mg) daily for 1 days, then, Take 3 tablets (30 mg) daily for 1 days, then, Take 2 tablets (20 mg) daily for 1 day, then, Take 1 tablets (10 mg) daily for 1 days, then stop 02/24/15   Jonetta Osgood, MD  traZODone (DESYREL) 100 MG tablet Take 200 mg by mouth at bedtime.    Historical Provider, MD   Triage Vitals: BP 123/53 mmHg  Pulse 61  Temp(Src) 98.2 F (36.8 C) (Oral)  Resp 20  Ht 6\' 1"  (1.854 m)  Wt 179 lb (81.194 kg)  BMI 23.62 kg/m2  SpO2 97%   Physical Exam  Constitutional: She is oriented to person, place, and time. She appears well-developed and well-nourished. No distress.  HENT:  Head: Normocephalic and atraumatic.  Mouth/Throat: Oropharynx is clear and moist. No oropharyngeal exudate.  Eyes: EOM are normal. Pupils are equal, round, and reactive to light.  Neck: Normal range of motion. Neck supple.  No meningismus   Cardiovascular: Normal rate and regular rhythm.  Exam reveals no gallop and no friction rub.   No murmur heard. Pulmonary/Chest: Effort normal and breath sounds normal. No respiratory distress. She has no wheezes. She has no rales. She exhibits no tenderness.  Abdominal: Soft. Bowel sounds are normal. She exhibits no distension and no mass. There is tenderness (patient with epigastric tenderness with palpation.). There is no rebound and no guarding.  Musculoskeletal: Normal range of motion. She exhibits no edema or tenderness.  Mild lower lumbar and sacral tenderness with palpation. No step-offs or deformity. No evidence of trauma. No lower extremity swelling or pain. Distal pulses intact.  Neurological: She is alert and oriented to person,  place, and time.  Patient is alert and oriented x3 with clear, goal oriented speech. Patient has 5/5 motor in all extremities. Sensation is intact to light touch. Bilateral finger-to-nose is normal with no signs of dysmetria. Patient has a normal gait and walks without assistance.  Skin: Skin is warm and dry. No rash noted. No erythema.  Patient has a small puncture wound to the left lower abdomen with very mild surrounding erythema. There appears to be no secondary infection. No masses or fluctuance. No other rashes noted.  Psychiatric:  Flat affect  Nursing note and vitals reviewed.   ED Course  Procedures (including critical care time)  DIAGNOSTIC STUDIES: Oxygen Saturation is 98% on RA, Normal by my interpretation.    COORDINATION OF CARE: 12:07 PM- Will order rocky [place] spotted fever abs pnl, lipase, urinalysis, CBC, and CMP. Discussed treatment plan with pt at bedside and pt agreed to plan.     Labs Review Labs Reviewed  CBC WITH DIFFERENTIAL/PLATELET - Abnormal; Notable for the following:    Hemoglobin 11.6 (*)    HCT 35.5 (*)    All other components within normal limits  COMPREHENSIVE METABOLIC PANEL - Abnormal; Notable for the  following:    Potassium 3.3 (*)    Glucose, Bld 105 (*)    All other components within normal limits  URINALYSIS, ROUTINE W REFLEX MICROSCOPIC (NOT AT [facility]) - Abnormal; Notable for the following:    Hgb urine dipstick TRACE (*)    All other components within normal limits  URINE MICROSCOPIC-ADD ON - Abnormal; Notable for the following:    Squamous Epithelial / LPF FEW (*)    All other components within normal limits  LIPASE, BLOOD  ROCKY MTN SPOTTED FVR ABS PNL(IGG+IGM)    Imaging Review No results found. I have personally reviewed and evaluated these images and lab results as part of my medical decision-making.   EKG Interpretation None      MDM   Final diagnoses:  NSAID induced gastritis  Myalgia  Tick bite of abdomen, initial encounter      I personally performed the services described in this documentation, which was scribed in my presence. The recorded information has been reviewed and is accurate.  Patient with likely NSAID-induced gastritis. Advised to stop taking ibuprofen. She may take over-the-counter Mylanta or Maalox. She is to continue her PPI and follow-up with gastroenterology should her symptoms persist. Patient also been advised to follow-up with her primary physician early next week. We'll start on doxycycline due to concern for tick borne illness. She's been given return precautions and is voiced understanding.  Julianne Rice, MD 09/02/15 [phone]

## 2015-09-02 LAB — URINALYSIS, ROUTINE W REFLEX MICROSCOPIC
Bilirubin Urine: NEGATIVE
GLUCOSE, UA: NEGATIVE mg/dL
Ketones, ur: NEGATIVE mg/dL
Leukocytes, UA: NEGATIVE
Nitrite: NEGATIVE
PH: 6.5 (ref 5.0–8.0)
Protein, ur: NEGATIVE mg/dL
Specific Gravity, Urine: 1.011 (ref 1.005–1.030)
Urobilinogen, UA: 0.2 mg/dL (ref 0.0–1.0)

## 2015-09-02 LAB — URINE MICROSCOPIC-ADD ON

## 2015-09-02 LAB — LIPASE, BLOOD: LIPASE: 28 U/L (ref 22–51)

## 2015-09-02 MED ORDER — HYDROCODONE-ACETAMINOPHEN 5-325 MG PO TABS
1.0000 | ORAL_TABLET | Freq: Once | ORAL | Status: AC
  Administered 2015-09-02: 1 via ORAL
  Filled 2015-09-02: qty 1

## 2015-09-02 MED ORDER — HYDROCODONE-ACETAMINOPHEN 5-325 MG PO TABS: 1.0000 | ORAL_TABLET | Freq: Four times a day (QID) | ORAL | Status: DC | PRN

## 2015-09-02 MED ORDER — GI COCKTAIL ~~LOC~~
30.0000 mL | Freq: Once | ORAL | Status: AC
  Administered 2015-09-02: 30 mL via ORAL
  Filled 2015-09-02: qty 30

## 2015-09-02 MED ORDER — ONDANSETRON 4 MG PO TBDP: ORAL_TABLET | ORAL | Status: DC

## 2015-09-02 MED ORDER — DOXYCYCLINE HYCLATE 100 MG PO TABS
100.0000 mg | ORAL_TABLET | Freq: Once | ORAL | Status: AC
  Administered 2015-09-02: 100 mg via ORAL
  Filled 2015-09-02: qty 1

## 2015-09-02 MED ORDER — PANTOPRAZOLE SODIUM 20 MG PO TBEC: 20.0000 mg | DELAYED_RELEASE_TABLET | Freq: Every day | ORAL | Status: DC

## 2015-09-02 MED ORDER — ONDANSETRON 4 MG PO TBDP
4.0000 mg | ORAL_TABLET | Freq: Once | ORAL | Status: AC
  Administered 2015-09-02: 4 mg via ORAL
  Filled 2015-09-02: qty 1

## 2015-09-02 MED ORDER — DOXYCYCLINE HYCLATE 100 MG PO CAPS: 100.0000 mg | ORAL_CAPSULE | Freq: Two times a day (BID) | ORAL | Status: DC

## 2015-09-02 NOTE — Discharge Instructions (Signed)
Stop using all NSAIDs. This includes ibuprofen and naproxen. Take medication as prescribed. Follow-up with your primary physician in 2 days to be reevaluated. Return immediately for fever, worsening headache, neck stiffness, visual difficulty or for any concerns.   Gastritis, Adult Gastritis is soreness and swelling (inflammation) of the lining of the stomach. Gastritis can develop as a sudden onset (acute) or long-term (chronic) condition. If gastritis is not treated, it can lead to stomach bleeding and ulcers. CAUSES  Gastritis occurs when the stomach lining is weak or damaged. Digestive juices from the stomach then inflame the weakened stomach lining. The stomach lining may be weak or damaged due to viral or bacterial infections. One common bacterial infection is the Helicobacter pylori infection. Gastritis can also result from excessive alcohol consumption, taking certain medicines, or having too much acid in the stomach.  SYMPTOMS  In some cases, there are no symptoms. When symptoms are present, they may include:  Pain or a burning sensation in the upper abdomen.  Nausea.  Vomiting.  An uncomfortable feeling of fullness after eating. DIAGNOSIS  Your caregiver may suspect you have gastritis based on your symptoms and a physical exam. To determine the cause of your gastritis, your caregiver may perform the following:  Blood or stool tests to check for the H pylori bacterium.  Gastroscopy. A thin, flexible tube (endoscope) is passed down the esophagus and into the stomach. The endoscope has a light and camera on the end. Your caregiver uses the endoscope to view the inside of the stomach.  Taking a tissue sample (biopsy) from the stomach to examine under a microscope. TREATMENT  Depending on the cause of your gastritis, medicines may be prescribed. If you have a bacterial infection, such as an H pylori infection, antibiotics may be given. If your gastritis is caused by too much acid in  the stomach, H2 blockers or antacids may be given. Your caregiver may recommend that you stop taking aspirin, ibuprofen, or other nonsteroidal anti-inflammatory drugs (NSAIDs). HOME CARE INSTRUCTIONS 1. Only take over-the-counter or prescription medicines as directed by your caregiver. 2. If you were given antibiotic medicines, take them as directed. Finish them even if you start to feel better. 3. Drink enough fluids to keep your urine clear or pale yellow. 4. Avoid foods and drinks that make your symptoms worse, such as: 1. Caffeine or alcoholic drinks. 2. Chocolate. 3. Peppermint or mint flavorings. 4. Garlic and onions. 5. Spicy foods. 6. Citrus fruits, such as oranges, lemons, or limes. 7. Tomato-based foods such as sauce, chili, salsa, and pizza. 8. Fried and fatty foods. 5. Eat small, frequent meals instead of large meals. SEEK IMMEDIATE MEDICAL CARE IF:   You have black or dark red stools.  You vomit blood or material that looks like coffee grounds.  You are unable to keep fluids down.  Your abdominal pain gets worse.  You have a fever.  You do not feel better after 1 week.  You have any other questions or concerns. MAKE SURE YOU:  Understand these instructions.  Will watch your condition.  Will get help right away if you are not doing well or get worse. Document Released: 11/27/2001 Document Revised: 06/03/2012 Document Reviewed: 01/16/2012 [facility] Patient Information 2015 [place], [place]. This information is not intended to replace advice given to you by your health care provider. Make sure you discuss any questions you have with your health care provider.  Muscle Pain Muscle pain (myalgia) may be caused by many things, including:  Overuse  or muscle strain, especially if you are not in shape. This is the most common cause of muscle pain.  Injury.  Bruises.  Viruses, such as the flu.  Infectious diseases.  Fibromyalgia, which is a chronic condition that  causes muscle tenderness, fatigue, and headache.  Autoimmune diseases, including lupus.  Certain drugs, including ACE inhibitors and statins. Muscle pain may be mild or severe. In most cases, the pain lasts only a short time and goes away without treatment. To diagnose the cause of your muscle pain, your health care provider will take your medical history. This means he or she will ask you when your muscle pain began and what has been happening. If you have not had muscle pain for very long, your health care provider may want to wait before doing much testing. If your muscle pain has lasted a long time, your health care provider may want to run tests right away. If your health care provider thinks your muscle pain may be caused by illness, you may need to have additional tests to rule out certain conditions.  Treatment for muscle pain depends on the cause. Home care is often enough to relieve muscle pain. Your health care provider may also prescribe anti-inflammatory medicine. HOME CARE INSTRUCTIONS Watch your condition for any changes. The following actions may help to lessen any discomfort you are feeling:  Only take over-the-counter or prescription medicines as directed by your health care provider.  Apply ice to the sore muscle:  Put ice in a plastic bag.  Place a towel between your skin and the bag.  Leave the ice on for 15-20 minutes, 3-4 times a day.  You may alternate applying hot and cold packs to the muscle as directed by your health care provider.  If overuse is causing your muscle pain, slow down your activities until the pain goes away.  Remember that it is normal to feel some muscle pain after starting a workout program. Muscles that have not been used often will be sore at first.  Do regular, gentle exercises if you are not usually active.  Warm up before exercising to lower your risk of muscle pain.  Do not continue working out if the pain is very bad. Bad pain could  mean you have injured a muscle. SEEK MEDICAL CARE IF: 6. Your muscle pain gets worse, and medicines do not help. 7. You have muscle pain that lasts longer than 3 days. 8. You have a rash or fever along with muscle pain. 9. You have muscle pain after a tick bite. 10. You have muscle pain while working out, even though you are in good physical condition. 11. You have redness, soreness, or swelling along with muscle pain. 12. You have muscle pain after starting a new medicine or changing the dose of a medicine. SEEK IMMEDIATE MEDICAL CARE IF:  You have trouble breathing.  You have trouble swallowing.  You have muscle pain along with a stiff neck, fever, and vomiting.  You have severe muscle weakness or cannot move part of your body. MAKE SURE YOU:   Understand these instructions.  Will watch your condition.  Will get help right away if you are not doing well or get worse. Document Released: 10/25/2006 Document Revised: 12/08/2013 Document Reviewed: 09/29/2013 [facility] Patient Information 2015 [place], [place]. This information is not intended to replace advice given to you by your health care provider. Make sure you discuss any questions you have with your health care provider.  Tick Bite Information Ticks  are insects that attach themselves to the skin and draw blood for food. There are various types of ticks. Common types include wood ticks and deer ticks. Most ticks live in shrubs and grassy areas. Ticks can climb onto your body when you make contact with leaves or grass where the tick is waiting. The most common places on the body for ticks to attach themselves are the scalp, neck, armpits, waist, and groin. Most tick bites are harmless, but sometimes ticks carry germs that cause diseases. These germs can be spread to a person during the tick's feeding process. The chance of a disease spreading through a tick bite depends on:   The type of tick.  Time of year.   How long the tick  is attached.   Geographic location.  HOW CAN YOU PREVENT TICK BITES? Take these steps to help prevent tick bites when you are outdoors:  Wear protective clothing. Long sleeves and long pants are best.   Wear white clothes so you can see ticks more easily.  Tuck your pant legs into your socks.   If walking on a trail, stay in the middle of the trail to avoid brushing against bushes.  Avoid walking through areas with long grass.  Put insect repellent on all exposed skin and along boot tops, pant legs, and sleeve cuffs.   Check clothing, hair, and skin repeatedly and before going inside.   Brush off any ticks that are not attached.  Take a shower or bath as soon as possible after being outdoors.  WHAT IS THE PROPER WAY TO REMOVE A TICK? Ticks should be removed as soon as possible to help prevent diseases caused by tick bites. 13. If latex gloves are available, put them on before trying to remove a tick.  14. Using fine-point tweezers, grasp the tick as close to the skin as possible. You may also use curved forceps or a tick removal tool. Grasp the tick as close to its head as possible. Avoid grasping the tick on its body. 15. Pull gently with steady upward pressure until the tick lets go. Do not twist the tick or jerk it suddenly. This may break off the tick's head or mouth parts. 16. Do not squeeze or crush the tick's body. This could force disease-carrying fluids from the tick into your body.  17. After the tick is removed, wash the bite area and your hands with soap and water or other disinfectant such as alcohol. 18. Apply a small amount of antiseptic cream or ointment to the bite site.  19. Wash and disinfect any instruments that were used.  Do not try to remove a tick by applying a hot match, petroleum jelly, or fingernail polish to the tick. These methods do not work and may increase the chances of disease being spread from the tick bite.  WHEN SHOULD YOU SEEK  MEDICAL CARE? Contact your health care provider if you are unable to remove a tick from your skin or if a part of the tick breaks off and is stuck in the skin.  After a tick bite, you need to be aware of signs and symptoms that could be related to diseases spread by ticks. Contact your health care provider if you develop any of the following in the days or weeks after the tick bite:  Unexplained fever.  Rash. A circular rash that appears days or weeks after the tick bite may indicate the possibility of Lyme disease. The rash may resemble a target with  a bull's-eye and may occur at a different part of your body than the tick bite.  Redness and swelling in the area of the tick bite.   Tender, swollen lymph glands.   Diarrhea.   Weight loss.   Cough.   Fatigue.   Muscle, joint, or bone pain.   Abdominal pain.   Headache.   Lethargy or a change in your level of consciousness.  Difficulty walking or moving your legs.   Numbness in the legs.   Paralysis.  Shortness of breath.   Confusion.   Repeated vomiting.  Document Released: 11/30/2000 Document Revised: 09/23/2013 Document Reviewed: 05/13/2013 [facility] Patient Information 2015 [place], [place]. This information is not intended to replace advice given to you by your health care provider. Make sure you discuss any questions you have with your health care provider.

## 2015-09-06 LAB — ROCKY MTN SPOTTED FVR ABS PNL(IGG+IGM)
RMSF IGG: NEGATIVE
RMSF IGM: 0.61 {index} (ref 0.00–0.89)

## 2015-09-12 IMAGING — CR DG KNEE AP/LAT W/ SUNRISE*L*
3 series · 3 of 3 positions shown · non-contrast
Comparison: None.

CLINICAL DATA: Left knee pain.

EXAM:
DG KNEE - 3 VIEWS

[view not recorded (1 of 3)]
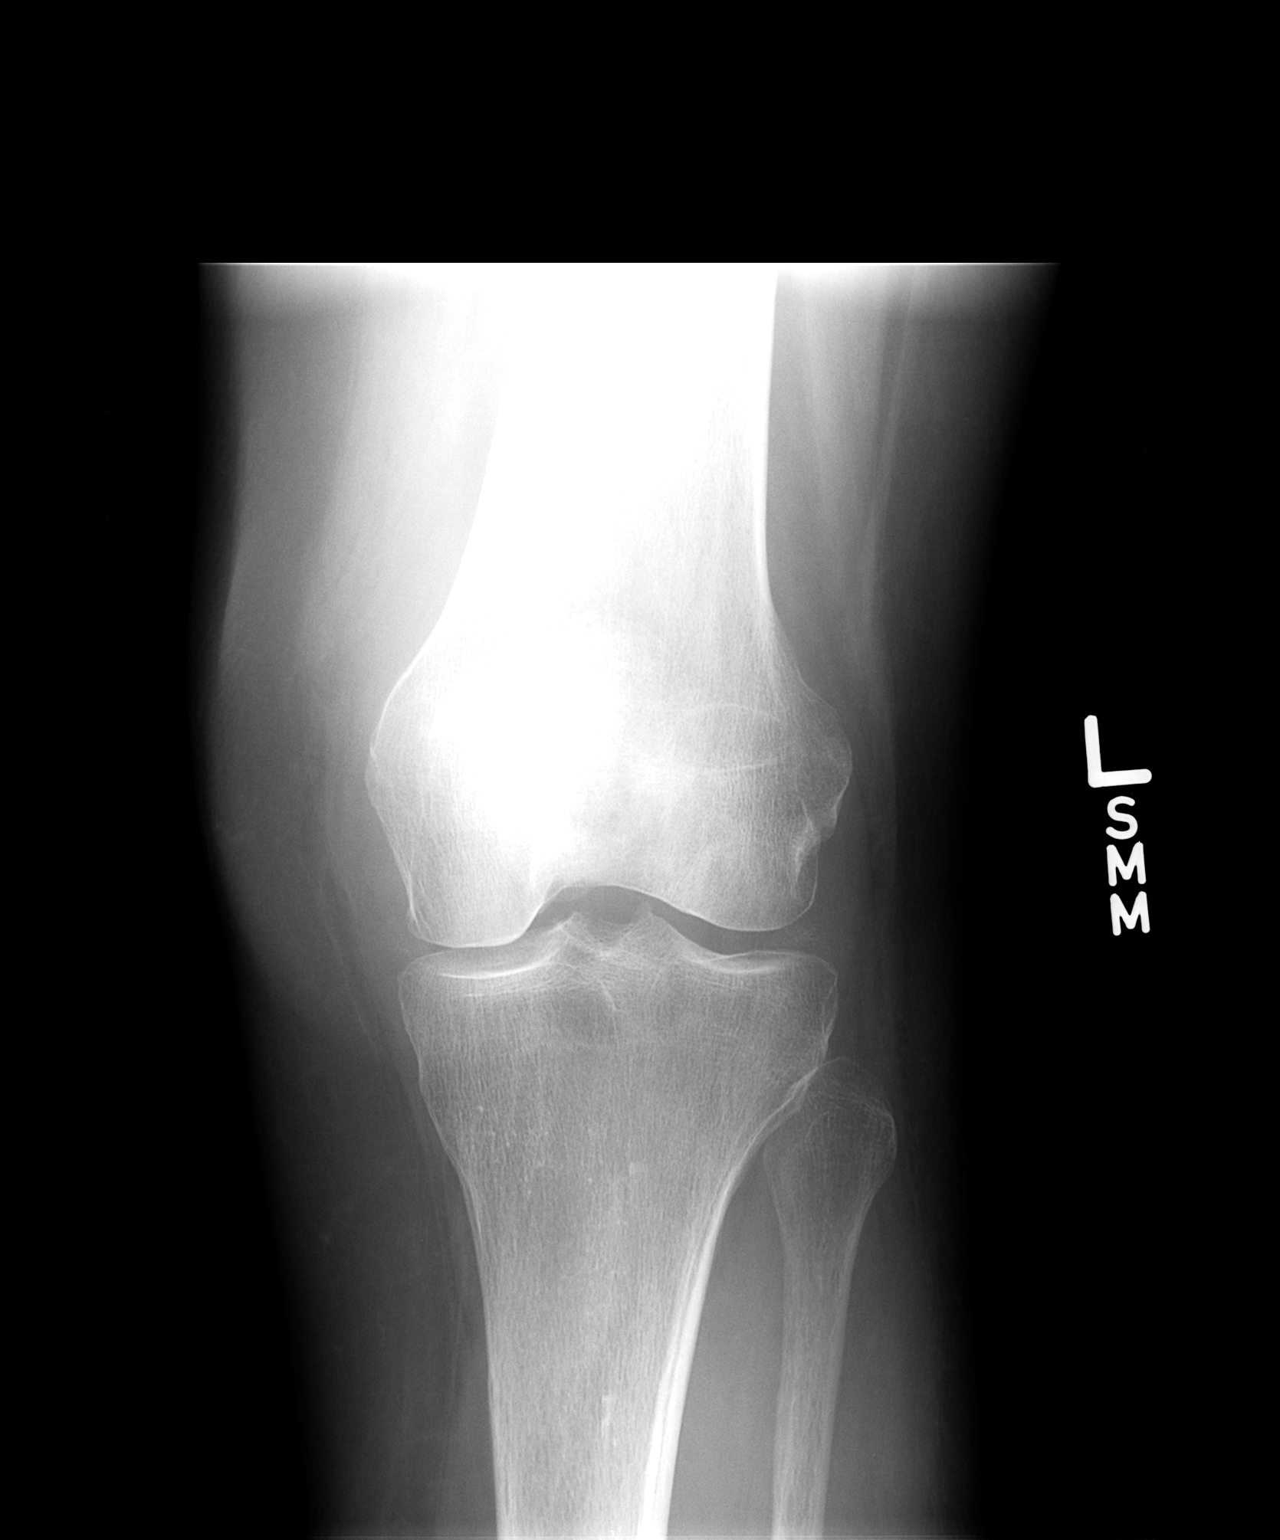

[view not recorded (2 of 3)]
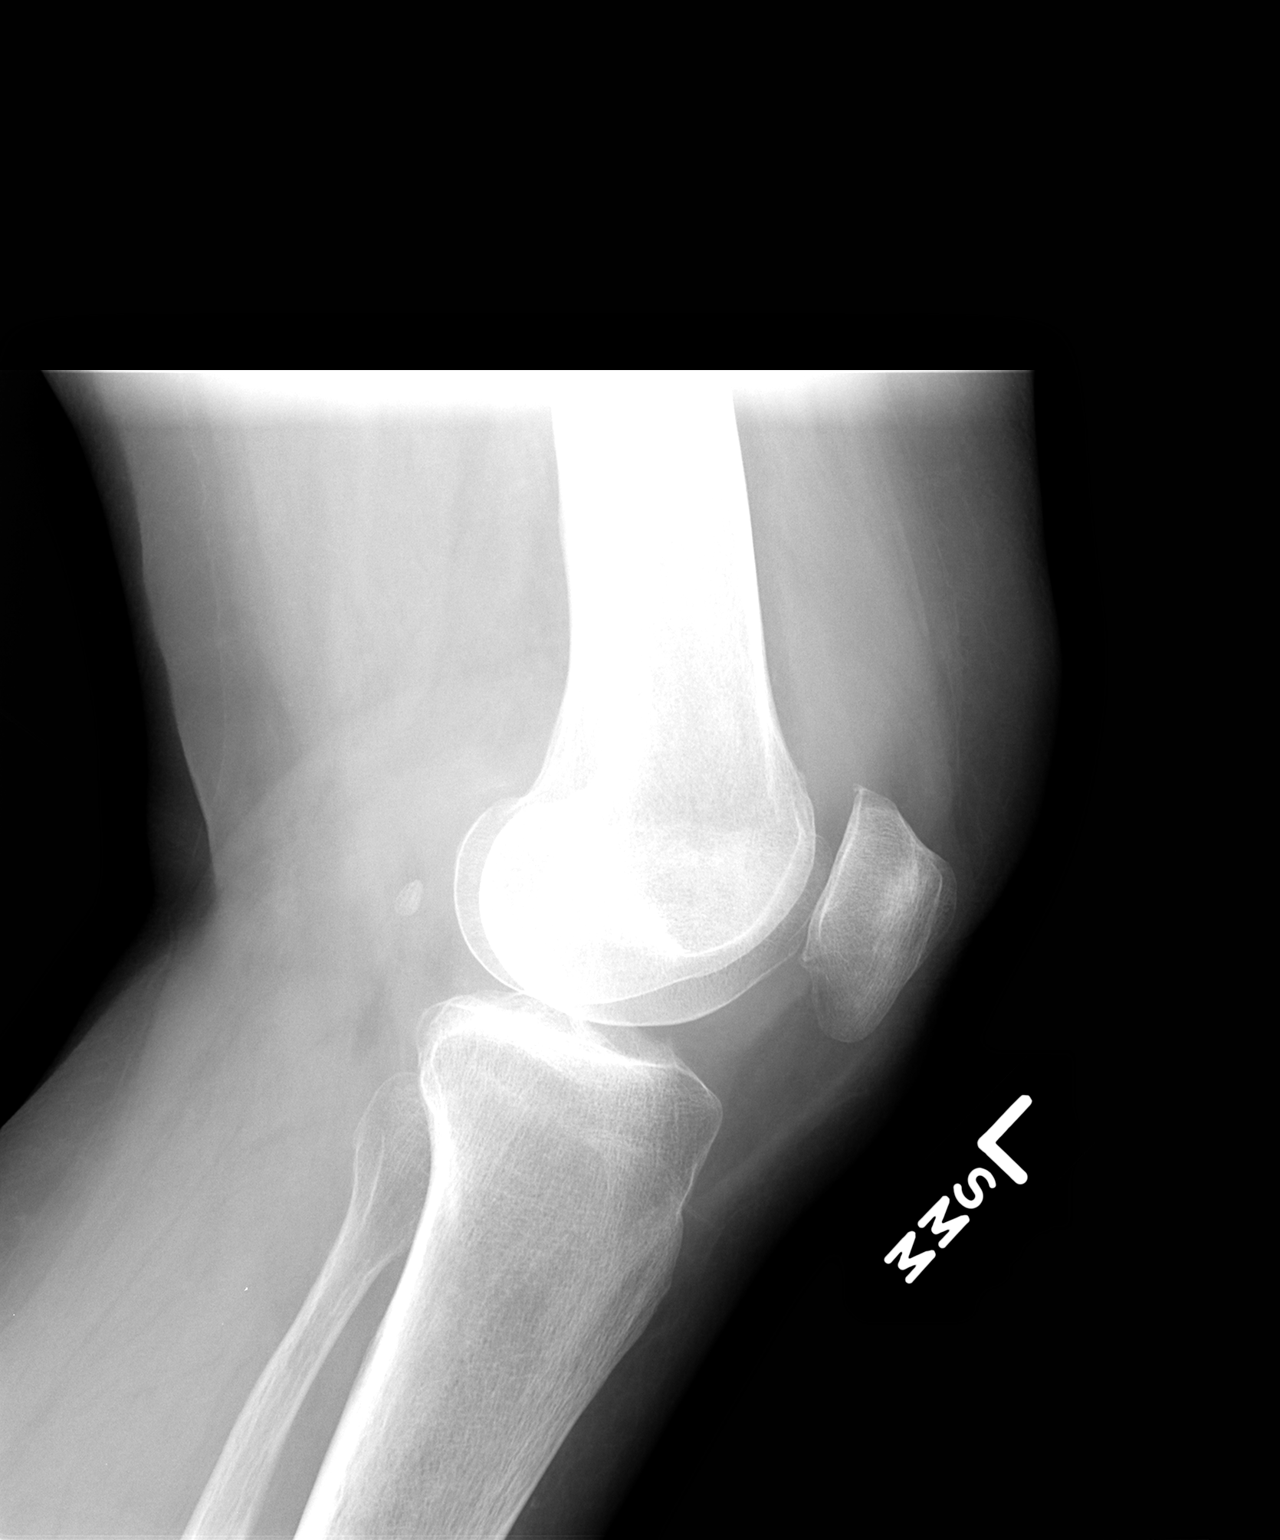

[view not recorded (3 of 3)]
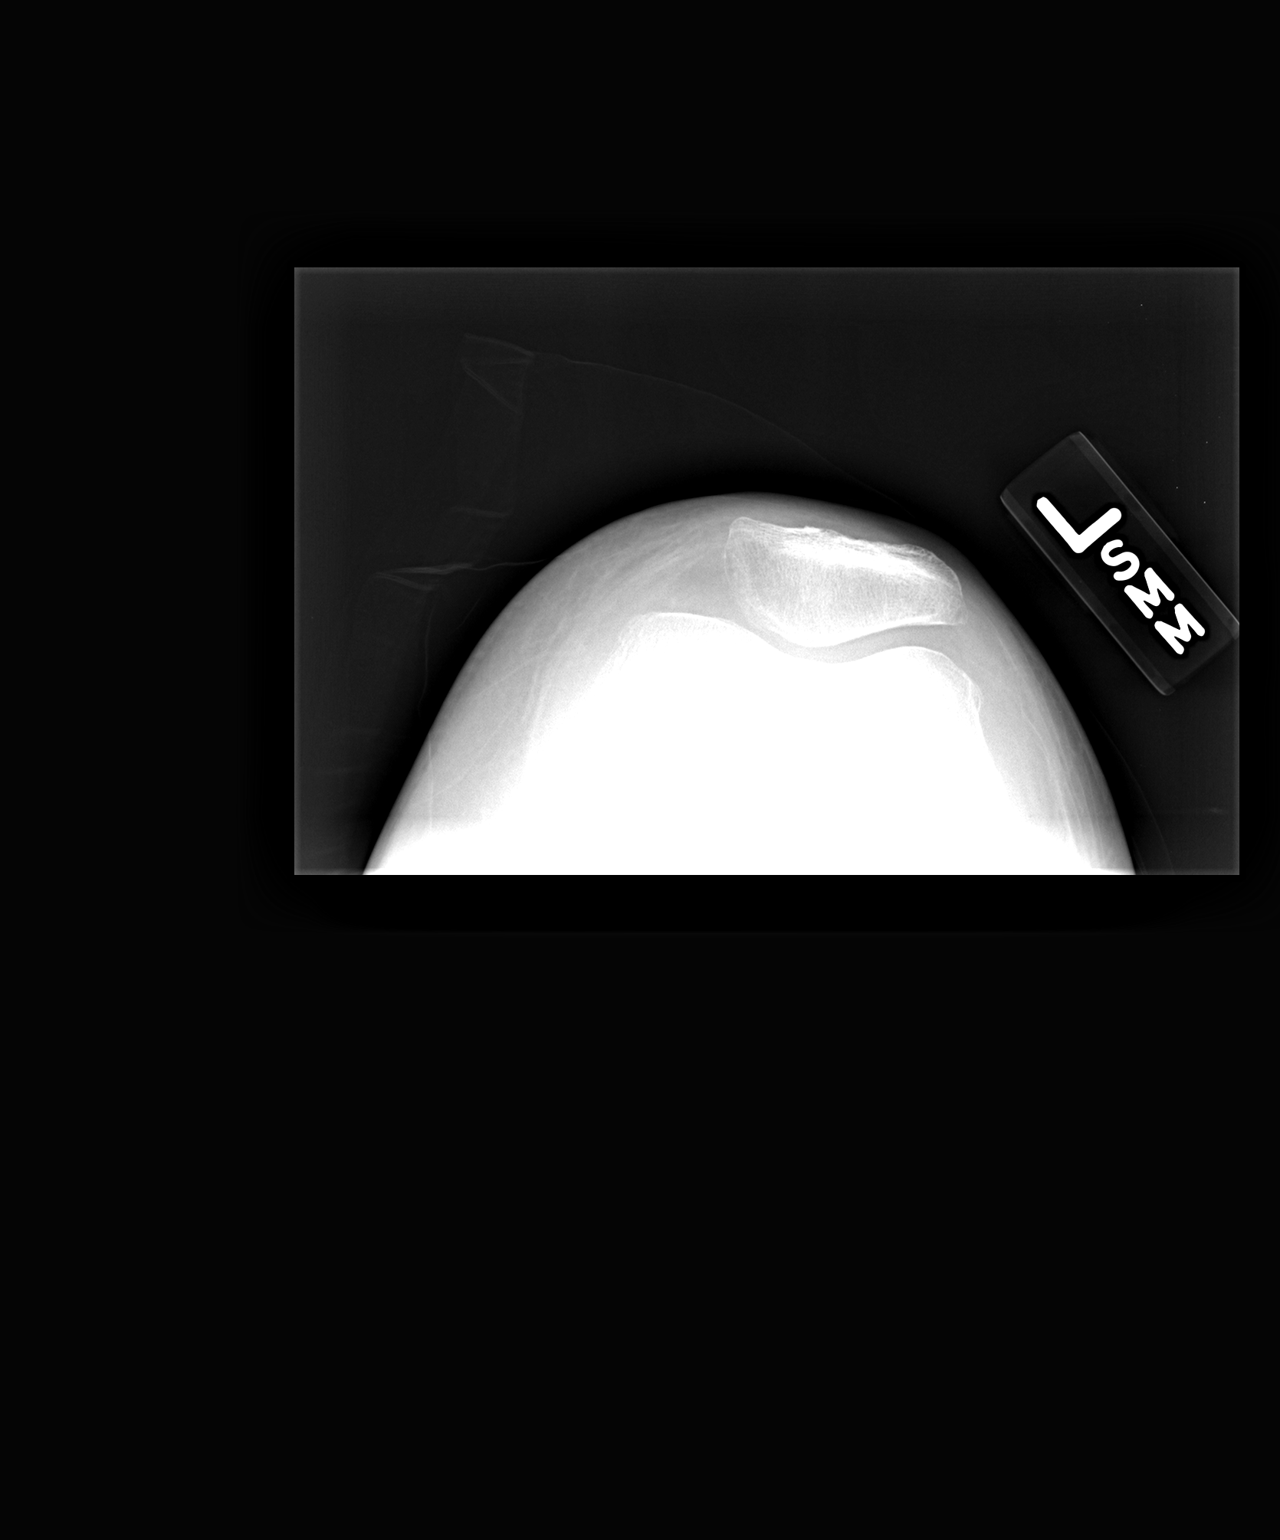

[3 of 3 positions shown; findings below may reference images not displayed]

FINDINGS: Small suprapatellar joint effusion noted. No fracture or subluxation
identified. Medial and patellofemoral compartment narrowing is
noted. There is sharpening of the tibial spines.
IMPRESSION: 1. No acute findings.
2. Osteoarthritis.

## 2015-11-30 ENCOUNTER — Emergency Department (HOSPITAL_COMMUNITY)
Admission: EM | Admit: 2015-11-30 | Discharge: 2015-11-30 | Disposition: A | Discharge status: Home / Self Care | Payer: Medicare Other | Attending: Emergency Medicine | Admitting: Emergency Medicine

## 2015-11-30 ENCOUNTER — Emergency Department (HOSPITAL_COMMUNITY): Payer: Medicare Other

## 2015-11-30 ENCOUNTER — Encounter (HOSPITAL_COMMUNITY): Payer: Self-pay | Admitting: *Deleted

## 2015-11-30 DIAGNOSIS — M1990 Unspecified osteoarthritis, unspecified site: Secondary | ICD-10-CM | POA: Diagnosis not present

## 2015-11-30 DIAGNOSIS — F17210 Nicotine dependence, cigarettes, uncomplicated: Secondary | ICD-10-CM | POA: Insufficient documentation

## 2015-11-30 DIAGNOSIS — Y9289 Other specified places as the place of occurrence of the external cause: Secondary | ICD-10-CM | POA: Insufficient documentation

## 2015-11-30 DIAGNOSIS — I10 Essential (primary) hypertension: Secondary | ICD-10-CM | POA: Insufficient documentation

## 2015-11-30 DIAGNOSIS — F329 Major depressive disorder, single episode, unspecified: Secondary | ICD-10-CM | POA: Diagnosis not present

## 2015-11-30 DIAGNOSIS — Z79899 Other long term (current) drug therapy: Secondary | ICD-10-CM | POA: Diagnosis not present

## 2015-11-30 DIAGNOSIS — Z862 Personal history of diseases of the blood and blood-forming organs and certain disorders involving the immune mechanism: Secondary | ICD-10-CM | POA: Insufficient documentation

## 2015-11-30 DIAGNOSIS — Y9301 Activity, walking, marching and hiking: Secondary | ICD-10-CM | POA: Diagnosis not present

## 2015-11-30 DIAGNOSIS — Y998 Other external cause status: Secondary | ICD-10-CM | POA: Insufficient documentation

## 2015-11-30 DIAGNOSIS — F419 Anxiety disorder, unspecified: Secondary | ICD-10-CM | POA: Insufficient documentation

## 2015-11-30 DIAGNOSIS — W228XXA Striking against or struck by other objects, initial encounter: Secondary | ICD-10-CM | POA: Diagnosis not present

## 2015-11-30 DIAGNOSIS — Z8669 Personal history of other diseases of the nervous system and sense organs: Secondary | ICD-10-CM | POA: Diagnosis not present

## 2015-11-30 DIAGNOSIS — Z792 Long term (current) use of antibiotics: Secondary | ICD-10-CM | POA: Insufficient documentation

## 2015-11-30 DIAGNOSIS — S9031XA Contusion of right foot, initial encounter: Secondary | ICD-10-CM | POA: Diagnosis not present

## 2015-11-30 DIAGNOSIS — S90121A Contusion of right lesser toe(s) without damage to nail, initial encounter: Secondary | ICD-10-CM | POA: Insufficient documentation

## 2015-11-30 DIAGNOSIS — S99921A Unspecified injury of right foot, initial encounter: Secondary | ICD-10-CM | POA: Diagnosis present

## 2015-11-30 MED ORDER — MELOXICAM 15 MG PO TABS: 15.0000 mg | ORAL_TABLET | Freq: Every day | ORAL | Status: DC

## 2015-11-30 MED ORDER — ACETAMINOPHEN 500 MG PO TABS
1000.0000 mg | ORAL_TABLET | Freq: Once | ORAL | Status: AC
  Administered 2015-11-30: 1000 mg via ORAL
  Filled 2015-11-30: qty 2

## 2015-11-30 NOTE — ED Notes (Signed)
Declined W/C at D/C and was escorted to lobby by RN. 

## 2015-11-30 NOTE — ED Provider Notes (Signed)
CSN: [id number]     Arrival date & time 11/30/15  1000 History  By signing my name below, I, Morgan [name], attest that this documentation has been prepared under the direction and in the presence of Margarita Mail, PA-C Electronically Signed: Ladene Artist, ED Scribe 11/30/2015 at 10:44 AM.   Chief Complaint  Patient presents with  . Foot Pain   The history is provided by the patient. No language interpreter was used.   HPI Comments: Morgan [name] is a 64 y.o. female who presents to the Emergency Department complaining of constant, 8/10 right foot pain onset yesterday. Pt states that she was walking around 8 PM last night when she struck her foot on a dresser. Pt reports the most pain in her right third, fourth and fifth toes that is worsened with palpation and bearing weight. Pt has tried Tylenol around 6 AM today with minimal relief.   Past Medical History  Diagnosis Date  . Allergy   . Anemia   . Anxiety   . Depression   . Neuromuscular disorder   . Hypertension   . Arthritis    Past Surgical History  Procedure Laterality Date  . Appendectomy    . Breast surgery    . Tubal ligation    . Abdominal hysterectomy    . Knee surgery  2005    2 times left   Family History  Problem Relation Age of Onset  . Cancer Mother     breast and ovarian  . Hypertension Father   . Hyperlipidemia Father   . Cancer Maternal Grandmother     breast  . Cancer Paternal Grandmother   . Cancer Paternal Grandfather   . Cancer Maternal Aunt     breast  . Cancer Maternal Aunt     breast   Social History  Substance Use Topics  . Smoking status: Current Every Day Smoker    Types: Cigarettes  . Smokeless tobacco: Never Used  . Alcohol Use: No   OB History    No data available     Review of Systems Allergies  Demerol  Home Medications   Prior to Admission medications   Medication Sig Start Date End Date Taking? Authorizing Provider  acetaminophen (TYLENOL) 500 MG tablet Take  1,000 mg by mouth every 6 (six) hours as needed for moderate pain (pain).     Historical Provider, MD  albuterol (PROVENTIL HFA;VENTOLIN HFA) 108 (90 BASE) MCG/ACT inhaler Inhale 2 puffs into the lungs every 4 (four) hours as needed for wheezing or shortness of breath. 02/24/15   Shanker Kristeen Mans, MD  ARIPiprazole (ABILIFY) 10 MG tablet Take 10 mg by mouth daily.    Historical Provider, MD  azithromycin (ZITHROMAX Z-PAK) 250 MG tablet Take as directed on pack 06/13/15   Billy Fischer, MD  Diclofenac Sodium 3 % GEL Apply to affected area up to 4 times daily Patient not taking: Reported on 12/11/2014 05/26/14   Waldemar Dickens, MD  doxycycline (VIBRAMYCIN) 100 MG capsule Take 1 capsule (100 mg total) by mouth 2 (two) times daily. One po bid x 7 days 09/02/15   Julianne Rice, MD  DULoxetine (CYMBALTA) 20 MG capsule Take 20 mg by mouth 2 (two) times daily.    Historical Provider, MD  gabapentin (NEURONTIN) 100 MG capsule Take 100 mg by mouth 3 (three) times daily.    Historical Provider, MD  guaiFENesin (MUCINEX) 600 MG 12 hr tablet Take 1 tablet (600 mg total) by mouth 2 (two)  times daily. 02/24/15   Shanker Kristeen Mans, MD  HYDROcodone-acetaminophen (NORCO/VICODIN) 5-325 MG per tablet Take 1 tablet by mouth every 6 (six) hours as needed for severe pain. 09/02/15   Julianne Rice, MD  ipratropium (ATROVENT) 0.06 % nasal spray Place 2 sprays into both nostrils 4 (four) times daily. 06/13/15   Billy Fischer, MD  levofloxacin (LEVAQUIN) 500 MG tablet Take 1 tablet (500 mg total) by mouth daily. 02/24/15   Shanker Kristeen Mans, MD  nicotine (NICODERM CQ - DOSED IN MG/24 HOURS) 21 mg/24hr patch Place 1 patch (21 mg total) onto the skin daily. 02/24/15   Shanker Kristeen Mans, MD  ondansetron (ZOFRAN ODT) 4 MG disintegrating tablet 4mg  ODT q4 hours prn nausea/vomit 09/02/15   Julianne Rice, MD  oseltamivir (TAMIFLU) 75 MG capsule Take 1 capsule (75 mg total) by mouth 2 (two) times daily. 02/24/15   Shanker Kristeen Mans, MD   predniSONE (DELTASONE) 10 MG tablet Take 4 tablets (40 mg) daily for 1 days, then, Take 3 tablets (30 mg) daily for 1 days, then, Take 2 tablets (20 mg) daily for 1 day, then, Take 1 tablets (10 mg) daily for 1 days, then stop 02/24/15   Jonetta Osgood, MD  traZODone (DESYREL) 100 MG tablet Take 200 mg by mouth at bedtime.    Historical Provider, MD   BP 166/82 mmHg  Pulse 78  Temp(Src) 98 F (36.7 C) (Oral)  Resp 16  Ht 6\' 1"  (1.854 m)  Wt 196 lb (88.905 kg)  BMI 25.86 kg/m2  SpO2 100% Physical Exam  Constitutional: She is oriented to person, place, and time. She appears well-developed and well-nourished. No distress.  HENT:  Head: Normocephalic and atraumatic.  Eyes: Conjunctivae and EOM are normal.  Neck: Neck supple. No tracheal deviation present.  Cardiovascular: Normal rate.   Pulmonary/Chest: Effort normal. No respiratory distress.  Musculoskeletal: Normal range of motion.  Bruising from the mid foot to the toe base of the 3rd, 4th, and 5th toes. The 5th toe is markedly ecchymotic.  Bruising under the ball of the lateral foot on the plantar surface. Exquisitely tender to palpation in the 5th toe. Able to move toes. Some decreased sensation in the 4th and 5th toes. Tenderness to palpation over the 3rd, 4th and 5th metatarsals. No ankle pain.   Neurological: She is alert and oriented to person, place, and time.  Skin: Skin is warm and dry.  Psychiatric: She has a normal mood and affect. Her behavior is normal.  Nursing note and vitals reviewed.  ED Course  Procedures (including critical care time) DIAGNOSTIC STUDIES: Oxygen Saturation is 100% on RA, normal by my interpretation.    COORDINATION OF CARE: 10:35 AM-Discussed treatment plan which includes XR, crutches, post op boot and Mobic with pt at bedside and pt agreed to plan.   Labs Review Labs Reviewed - No data to display  Imaging Review Dg Foot Complete Right  11/30/2015  CLINICAL DATA:  Bruising of the base  of the toes scratch the bruising about the proximal right toes after a blow to the right foot today. Initial encounter. EXAM: RIGHT FOOT COMPLETE - 3+ VIEW COMPARISON:  None. FINDINGS: There is no evidence of fracture or dislocation. There is no evidence of arthropathy or other focal bone abnormality. Soft tissues are unremarkable. IMPRESSION: Negative exam. Electronically Signed   By: Inge Rise M.D.   On: 11/30/2015 10:28   I have personally reviewed and evaluated these images and lab results as part  of my medical decision-making.   EKG Interpretation None      MDM  Patient X-Ray negative for obvious fracture or dislocation. Pain managed in ED. Pt advised to follow up with orthopedics if symptoms persist for possibility of missed fracture diagnosis. Patient given brace while in ED, conservative therapy recommended and discussed. Patient will be dc home & is agreeable with above plan.  Final diagnoses:  None    I personally performed the services described in this documentation, which was scribed in my presence. The recorded information has been reviewed and is accurate.        Margarita Mail, PA-C 11/30/15 1604  Quintella Reichert, MD 12/01/15 [phone]

## 2015-11-30 NOTE — Discharge Instructions (Signed)
Foot Contusion A foot contusion is a deep bruise to the foot. Contusions are the result of an injury that caused bleeding under the skin. The contusion may turn blue, purple, or yellow. Minor injuries will give you a painless contusion, but more severe contusions may stay painful and swollen for a few weeks. CAUSES  A foot contusion comes from a direct blow to that area, such as a heavy object falling on the foot. SYMPTOMS   Swelling of the foot.  Discoloration of the foot.  Tenderness or soreness of the foot. DIAGNOSIS  You will have a physical exam and will be asked about your history. You may need an X-ray of your foot to look for a broken bone (fracture).  TREATMENT  An elastic wrap may be recommended to support your foot. Resting, elevating, and applying cold compresses to your foot are often the best treatments for a foot contusion. Over-the-counter medicines may also be recommended for pain control. HOME CARE INSTRUCTIONS   Put ice on the injured area.  Put ice in a plastic bag.  Place a towel between your skin and the bag.  Leave the ice on for 15-20 minutes, 03-04 times a day.  Only take over-the-counter or prescription medicines for pain, discomfort, or fever as directed by your caregiver.  If told, use an elastic wrap as directed. This can help reduce swelling. You may remove the wrap for sleeping, showering, and bathing. If your toes become numb, cold, or blue, take the wrap off and reapply it more loosely.  Elevate your foot with pillows to reduce swelling.  Try to avoid standing or walking while the foot is painful. Do not resume use until instructed by your caregiver. Then, begin use gradually. If pain develops, decrease use. Gradually increase activities that do not cause discomfort until you have normal use of your foot.  See your caregiver as directed. It is very important to keep all follow-up appointments in order to avoid any lasting problems with your foot,  including long-term (chronic) pain. SEEK IMMEDIATE MEDICAL CARE IF:   You have increased redness, swelling, or pain in your foot.  Your swelling or pain is not relieved with medicines.  You have loss of feeling in your foot or are unable to move your toes.  Your foot turns cold or blue.  You have pain when you move your toes.  Your foot becomes warm to the touch.  Your contusion does not improve in 2 days. MAKE SURE YOU:   Understand these instructions.  Will watch your condition.  Will get help right away if you are not doing well or get worse.   This information is not intended to replace advice given to you by your health care provider. Make sure you discuss any questions you have with your health care provider.   Document Released: 09/24/2006 Document Revised: 06/03/2012 Document Reviewed: 08/09/2015 Elsevier Interactive Patient Education 2016 [place]. Cryotherapy Cryotherapy means treatment with cold. Ice or gel packs can be used to reduce both pain and swelling. Ice is the most helpful within the first 24 to 48 hours after an injury or flare-up from overusing a muscle or joint. Sprains, strains, spasms, burning pain, shooting pain, and aches can all be eased with ice. Ice can also be used when recovering from surgery. Ice is effective, has very few side effects, and is safe for most people to use. PRECAUTIONS  Ice is not a safe treatment option for people with:  Raynaud phenomenon. This is  a condition affecting small blood vessels in the extremities. Exposure to cold may cause your problems to return.  Cold hypersensitivity. There are many forms of cold hypersensitivity, including:  Cold urticaria. Red, itchy hives appear on the skin when the tissues begin to warm after being iced.  Cold erythema. This is a red, itchy rash caused by exposure to cold.  Cold hemoglobinuria. Red blood cells break down when the tissues begin to warm after being iced. The hemoglobin  that carry oxygen are passed into the urine because they cannot combine with blood proteins fast enough.  Numbness or altered sensitivity in the area being iced. If you have any of the following conditions, do not use ice until you have discussed cryotherapy with your caregiver:  Heart conditions, such as arrhythmia, angina, or chronic heart disease.  High blood pressure.  Healing wounds or open skin in the area being iced.  Current infections.  Rheumatoid arthritis.  Poor circulation.  Diabetes. Ice slows the blood flow in the region it is applied. This is beneficial when trying to stop inflamed tissues from spreading irritating chemicals to surrounding tissues. However, if you expose your skin to cold temperatures for too long or without the proper protection, you can damage your skin or nerves. Watch for signs of skin damage due to cold. HOME CARE INSTRUCTIONS Follow these tips to use ice and cold packs safely.  Place a dry or damp towel between the ice and skin. A damp towel will cool the skin more quickly, so you may need to shorten the time that the ice is used.  For a more rapid response, add gentle compression to the ice.  Ice for no more than 10 to 20 minutes at a time. The bonier the area you are icing, the less time it will take to get the benefits of ice.  Check your skin after 5 minutes to make sure there are no signs of a poor response to cold or skin damage.  Rest 20 minutes or more between uses.  Once your skin is numb, you can end your treatment. You can test numbness by very lightly touching your skin. The touch should be so light that you do not see the skin dimple from the pressure of your fingertip. When using ice, most people will feel these normal sensations in this order: cold, burning, aching, and numbness.  Do not use ice on someone who cannot communicate their responses to pain, such as small children or people with dementia. HOW TO MAKE AN ICE PACK Ice  packs are the most common way to use ice therapy. Other methods include ice massage, ice baths, and cryosprays. Muscle creams that cause a cold, tingly feeling do not offer the same benefits that ice offers and should not be used as a substitute unless recommended by your caregiver. To make an ice pack, do one of the following:  Place crushed ice or a bag of frozen vegetables in a sealable plastic bag. Squeeze out the excess air. Place this bag inside another plastic bag. Slide the bag into a pillowcase or place a damp towel between your skin and the bag.  Mix 3 parts water with 1 part rubbing alcohol. Freeze the mixture in a sealable plastic bag. When you remove the mixture from the freezer, it will be slushy. Squeeze out the excess air. Place this bag inside another plastic bag. Slide the bag into a pillowcase or place a damp towel between your skin and the bag. SEEK  MEDICAL CARE IF:  You develop white spots on your skin. This may give the skin a blotchy (mottled) appearance.  Your skin turns blue or pale.  Your skin becomes waxy or hard.  Your swelling gets worse. MAKE SURE YOU:   Understand these instructions.  Will watch your condition.  Will get help right away if you are not doing well or get worse.   This information is not intended to replace advice given to you by your health care provider. Make sure you discuss any questions you have with your health care provider.   Document Released: 07/30/2011 Document Revised: 12/24/2014 Document Reviewed: 07/30/2011 Elsevier Interactive Patient Education [employer].

## 2015-11-30 NOTE — ED Notes (Signed)
SEE A assessment

## 2016-03-29 IMAGING — CR DG CHEST 2V
2 series · 2 of 2 positions shown · non-contrast
Comparison: None.

CLINICAL DATA: Cough and congestion for 4 day

EXAM:
CHEST  2 VIEW

[w chest pa]
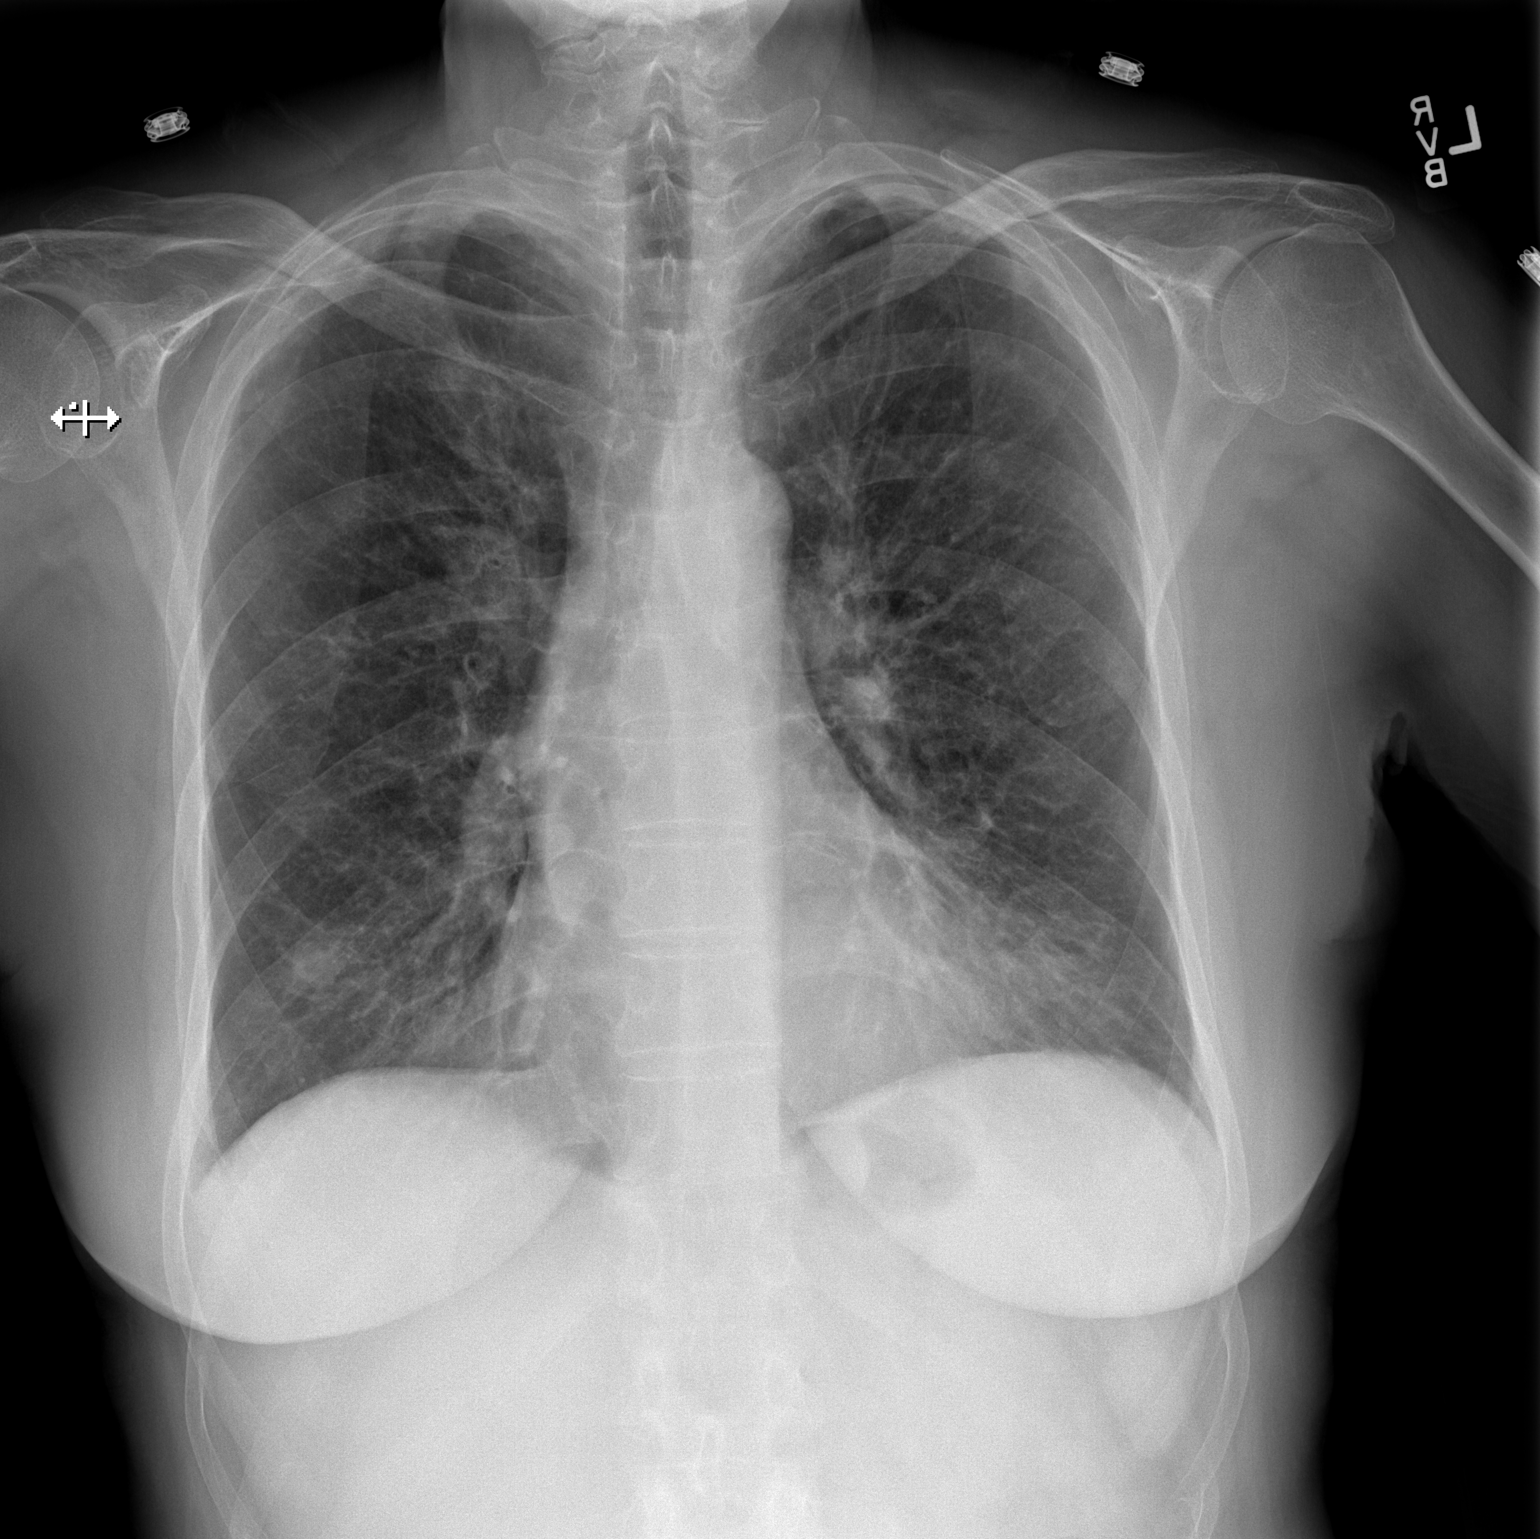

[w chest lat]
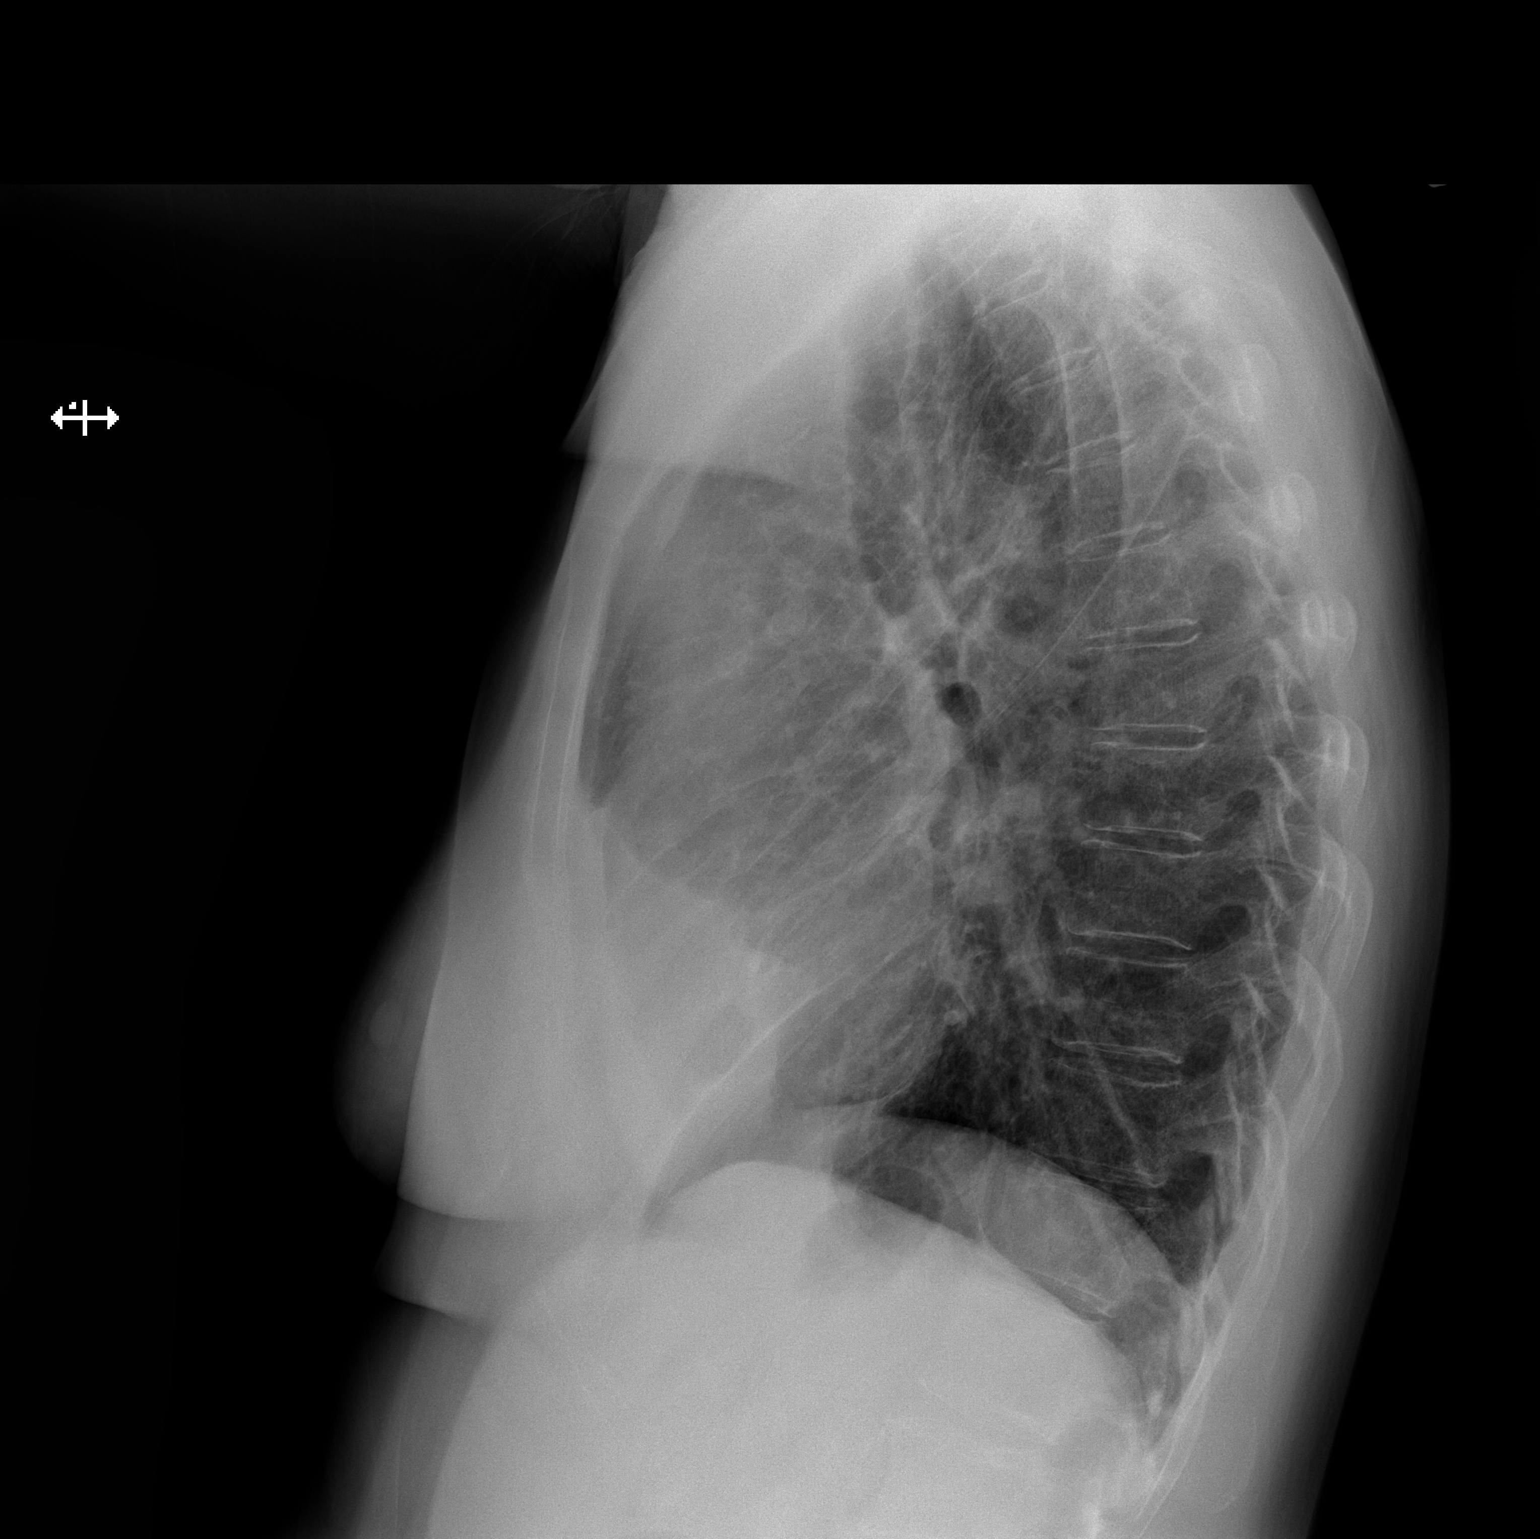

[2 of 2 positions shown; findings below may reference images not displayed]

FINDINGS: Patchy airspace opacities at the left base, right base, and right
upper lobe. Upper normal heart size. No pneumothorax. No pleural
effusion. Mild hyperaeration.
IMPRESSION: Patchy bilateral airspace opacities. Follow-up studies are
recommended until completely resolved.

## 2016-05-15 ENCOUNTER — Encounter (HOSPITAL_COMMUNITY): Payer: Self-pay | Admitting: Emergency Medicine

## 2016-05-15 ENCOUNTER — Emergency Department (HOSPITAL_COMMUNITY): Payer: Medicare Other

## 2016-05-15 ENCOUNTER — Emergency Department (HOSPITAL_COMMUNITY)
Admission: EM | Admit: 2016-05-15 | Discharge: 2016-05-15 | Disposition: A | Discharge status: Home / Self Care | Payer: Medicare Other | Attending: Emergency Medicine | Admitting: Emergency Medicine

## 2016-05-15 DIAGNOSIS — Z79899 Other long term (current) drug therapy: Secondary | ICD-10-CM | POA: Diagnosis not present

## 2016-05-15 DIAGNOSIS — F419 Anxiety disorder, unspecified: Secondary | ICD-10-CM | POA: Diagnosis not present

## 2016-05-15 DIAGNOSIS — J159 Unspecified bacterial pneumonia: Secondary | ICD-10-CM | POA: Insufficient documentation

## 2016-05-15 DIAGNOSIS — R05 Cough: Secondary | ICD-10-CM | POA: Diagnosis present

## 2016-05-15 DIAGNOSIS — G709 Myoneural disorder, unspecified: Secondary | ICD-10-CM | POA: Diagnosis not present

## 2016-05-15 DIAGNOSIS — Z862 Personal history of diseases of the blood and blood-forming organs and certain disorders involving the immune mechanism: Secondary | ICD-10-CM | POA: Diagnosis not present

## 2016-05-15 DIAGNOSIS — F17210 Nicotine dependence, cigarettes, uncomplicated: Secondary | ICD-10-CM | POA: Insufficient documentation

## 2016-05-15 DIAGNOSIS — R079 Chest pain, unspecified: Secondary | ICD-10-CM | POA: Diagnosis not present

## 2016-05-15 DIAGNOSIS — Z792 Long term (current) use of antibiotics: Secondary | ICD-10-CM | POA: Diagnosis not present

## 2016-05-15 DIAGNOSIS — I10 Essential (primary) hypertension: Secondary | ICD-10-CM | POA: Insufficient documentation

## 2016-05-15 DIAGNOSIS — M1990 Unspecified osteoarthritis, unspecified site: Secondary | ICD-10-CM | POA: Insufficient documentation

## 2016-05-15 DIAGNOSIS — F329 Major depressive disorder, single episode, unspecified: Secondary | ICD-10-CM | POA: Insufficient documentation

## 2016-05-15 DIAGNOSIS — J189 Pneumonia, unspecified organism: Secondary | ICD-10-CM

## 2016-05-15 LAB — CBC WITH DIFFERENTIAL/PLATELET
BASOS PCT: 0 %
Basophils Absolute: 0 10*3/uL (ref 0.0–0.1)
EOS ABS: 0.1 10*3/uL (ref 0.0–0.7)
Eosinophils Relative: 3 %
HEMATOCRIT: 32.7 % — AB (ref 36.0–46.0)
Hemoglobin: 10.1 g/dL — ABNORMAL LOW (ref 12.0–15.0)
Lymphocytes Relative: 48 %
Lymphs Abs: 2.5 10*3/uL (ref 0.7–4.0)
MCH: 25.3 pg — AB (ref 26.0–34.0)
MCHC: 30.9 g/dL (ref 30.0–36.0)
MCV: 82 fL (ref 78.0–100.0)
MONO ABS: 0.2 10*3/uL (ref 0.1–1.0)
MONOS PCT: 4 %
Neutro Abs: 2.4 10*3/uL (ref 1.7–7.7)
Neutrophils Relative %: 45 %
Platelets: 328 10*3/uL (ref 150–400)
RBC: 3.99 MIL/uL (ref 3.87–5.11)
RDW: 15 % (ref 11.5–15.5)
WBC: 5.3 10*3/uL (ref 4.0–10.5)

## 2016-05-15 LAB — I-STAT CHEM 8, ED
BUN: 15 mg/dL (ref 6–20)
CALCIUM ION: 1.07 mmol/L — AB (ref 1.13–1.30)
CREATININE: 0.8 mg/dL (ref 0.44–1.00)
Chloride: 99 mmol/L — ABNORMAL LOW (ref 101–111)
Glucose, Bld: 97 mg/dL (ref 65–99)
HEMATOCRIT: 32 % — AB (ref 36.0–46.0)
HEMOGLOBIN: 10.9 g/dL — AB (ref 12.0–15.0)
Potassium: 2.8 mmol/L — ABNORMAL LOW (ref 3.5–5.1)
Sodium: 142 mmol/L (ref 135–145)
TCO2: 30 mmol/L (ref 0–100)

## 2016-05-15 IMAGING — MG MM DIAG BREAST TOMO BILATERAL
8 series · 8 of 24 positions shown · non-contrast
Comparison: Previous exams

CLINICAL DATA: Followup of a probably benign right breast mass.

EXAM:
DIGITAL DIAGNOSTIC BILATERAL MAMMOGRAM WITH 3D TOMOSYNTHESIS AND CAD
RIGHT BREAST ULTRASOUND

[R MLO]
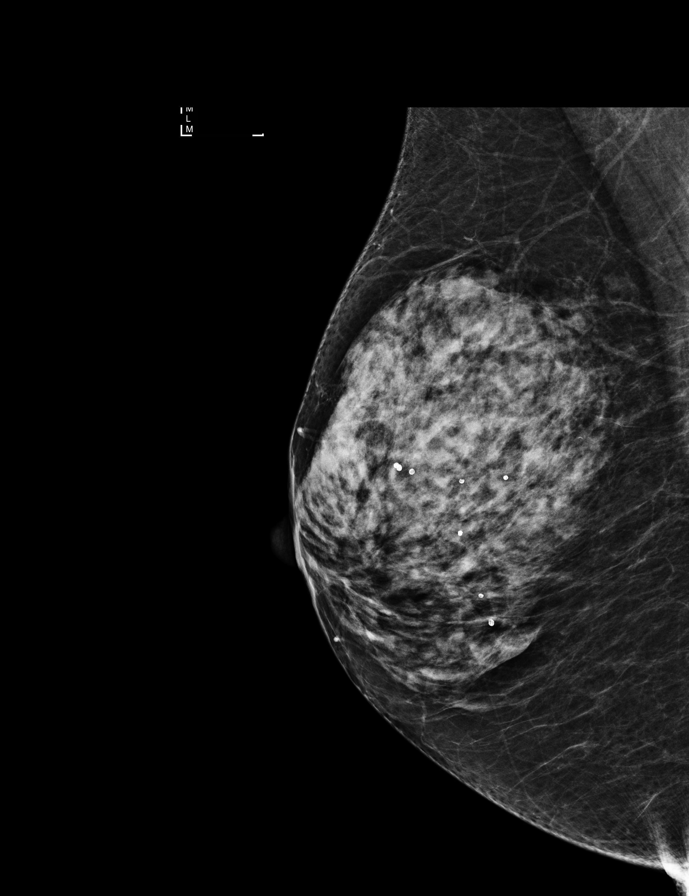

[L MLO]
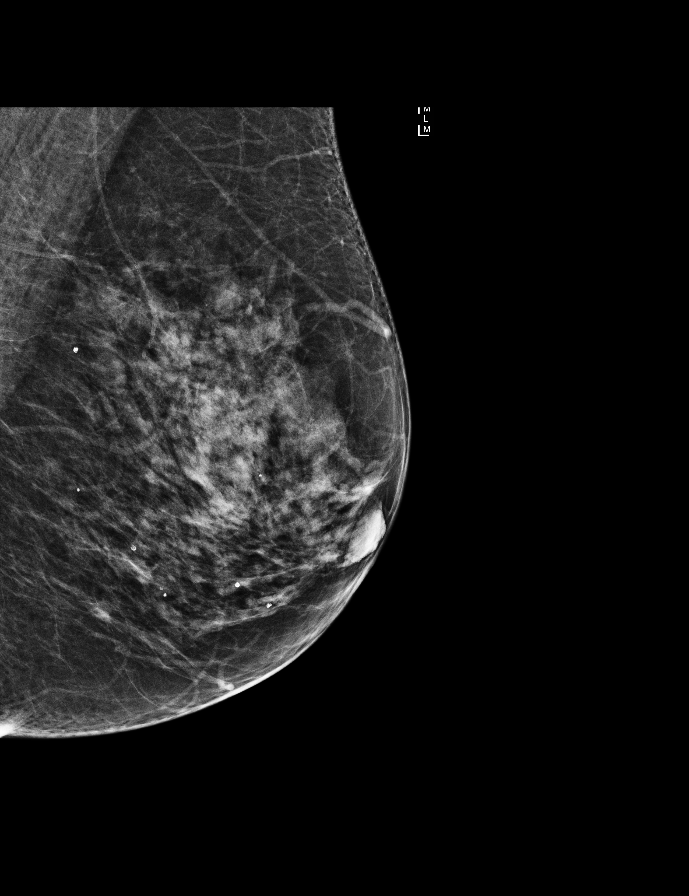

[R CC]
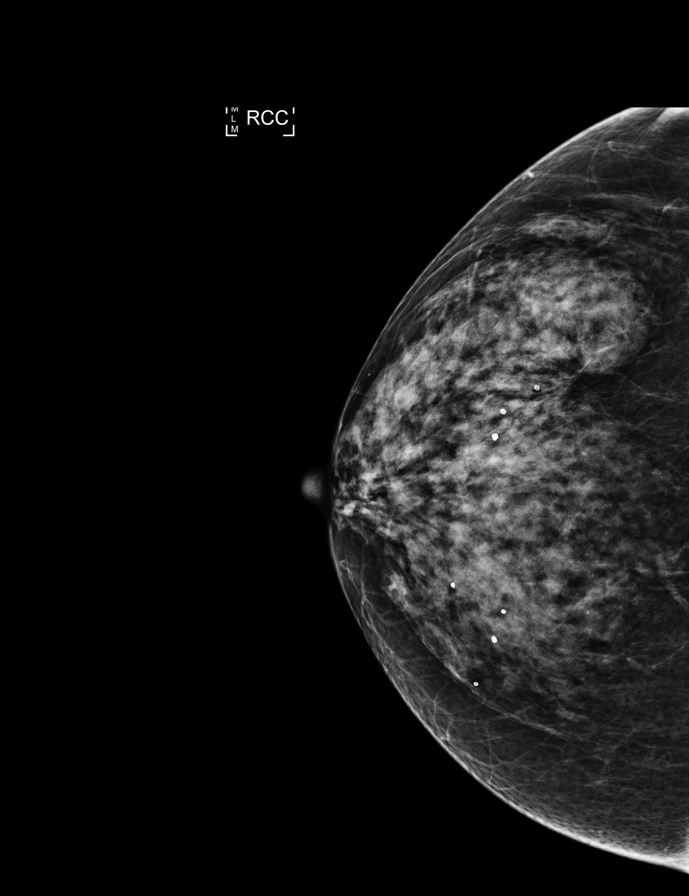

[L CC]
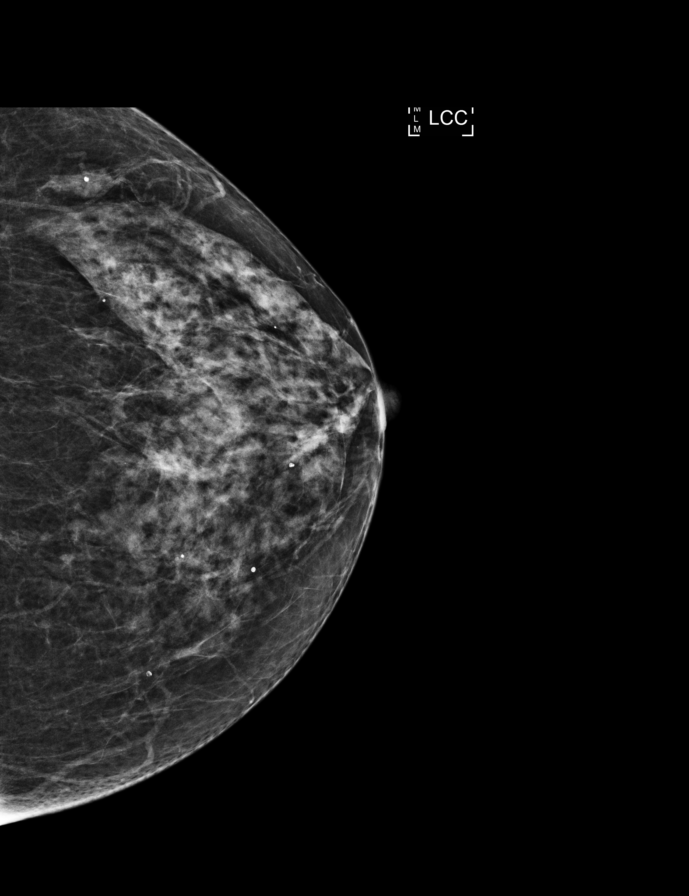

[R MLO tomo · tomo slice 23/45.0]
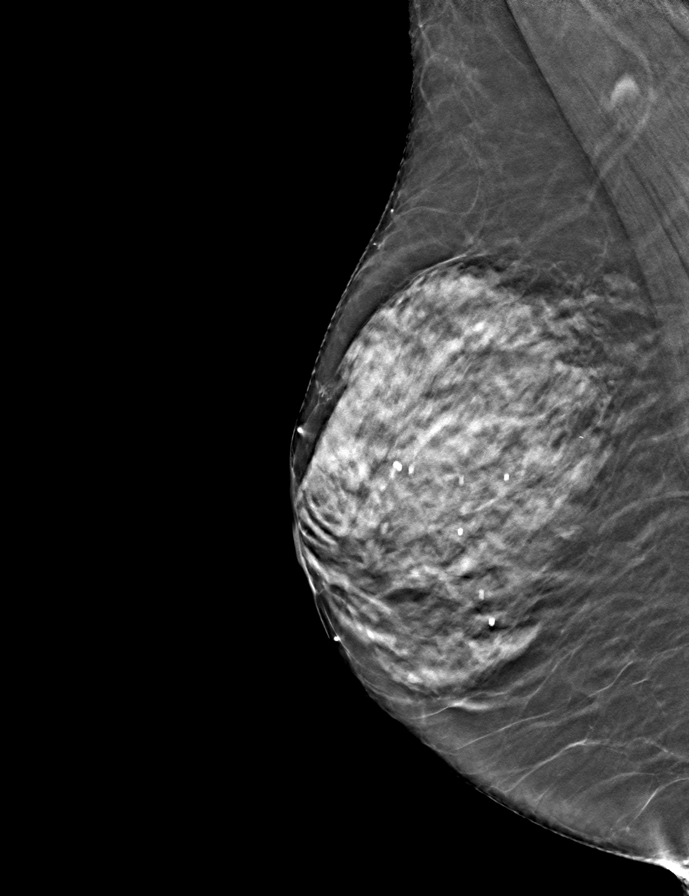

[R CC tomo · tomo slice 22/43.0]
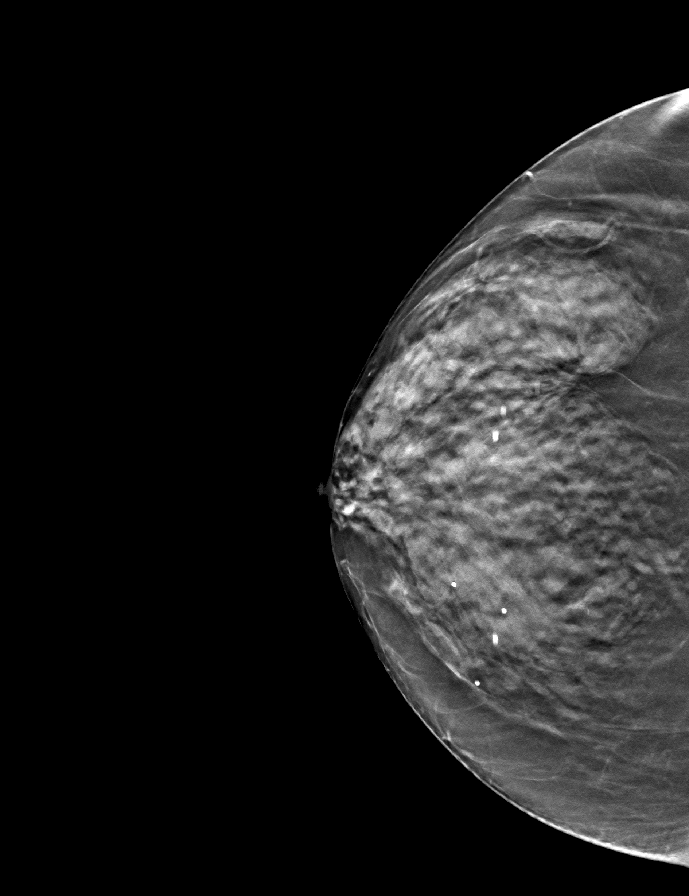

[L CC tomo · tomo slice 21/40.0]
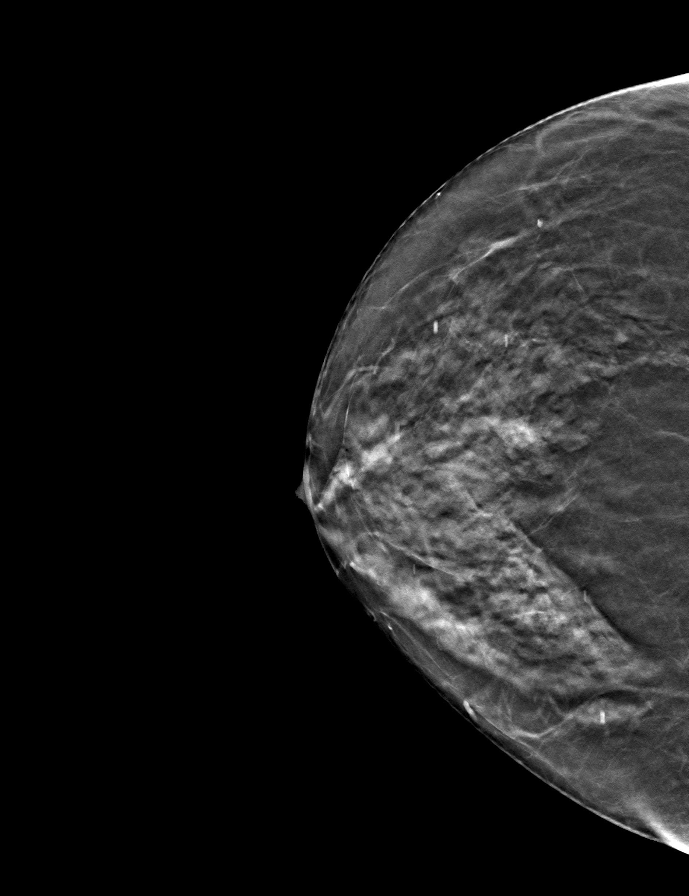

[L MLO tomo · tomo slice 21/41.0]
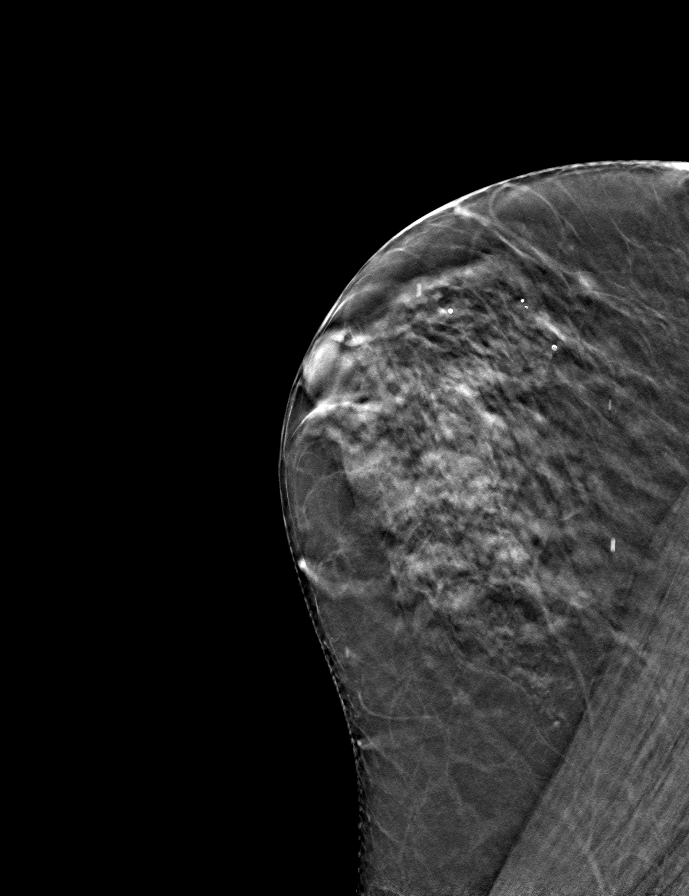

[8 of 24 positions shown; findings below may reference images not displayed]

ACR Breast Density Category c: The breast tissue is heterogeneously
dense, which may obscure small masses.
FINDINGS: No suspicious masses or calcifications are seen in either breast.
The probably benign mass in the outer right breast best seen on the
CC tomosynthesis appears unchanged.

Mammographic images were processed with CAD.

Physical examination of the outer right breast is not reveal any
palpable masses.

Targeted ultrasound of the right breast was performed demonstrating
an oval well-circumscribed hypoechoic mass at 11 o'clock 5 cm from
the nipple measuring 0.8 x 0.3 by 0.7 cm. This is overall unchanged
in size and appearance given differences in imaging in measurement
techniques.
IMPRESSION: 1.  Benign right breast mass, unchanged dating back to 09/04/2012.

2.  No mammographic evidence of malignancy in either breast.

RECOMMENDATION:
Screening mammogram in one year.(Code:PW-9-PVP)

I have discussed the findings and recommendations with the patient.
Results were also provided in writing at the conclusion of the
visit. If applicable, a reminder letter will be sent to the patient
regarding the next appointment.

BI-RADS CATEGORY  2: Benign.

## 2016-05-15 IMAGING — US US BREAST LTD UNI RIGHT INC AXILLA
1 series · 5 of 5 positions shown · non-contrast
Comparison: Previous exams

CLINICAL DATA: Followup of a probably benign right breast mass.

EXAM:
DIGITAL DIAGNOSTIC BILATERAL MAMMOGRAM WITH 3D TOMOSYNTHESIS AND CAD
RIGHT BREAST ULTRASOUND

[Series 1: us breast ltd uni right inc axilla · 0.06mm/px · 5 of 5 slices shown]
[im 1/5]
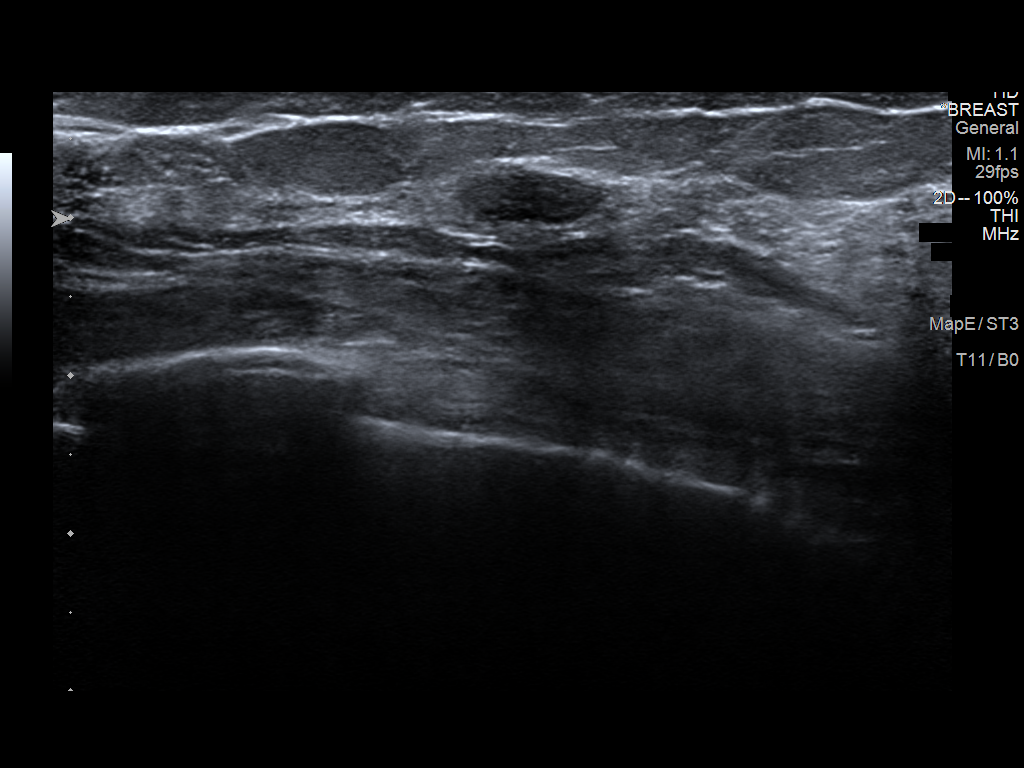
[im 2/5]
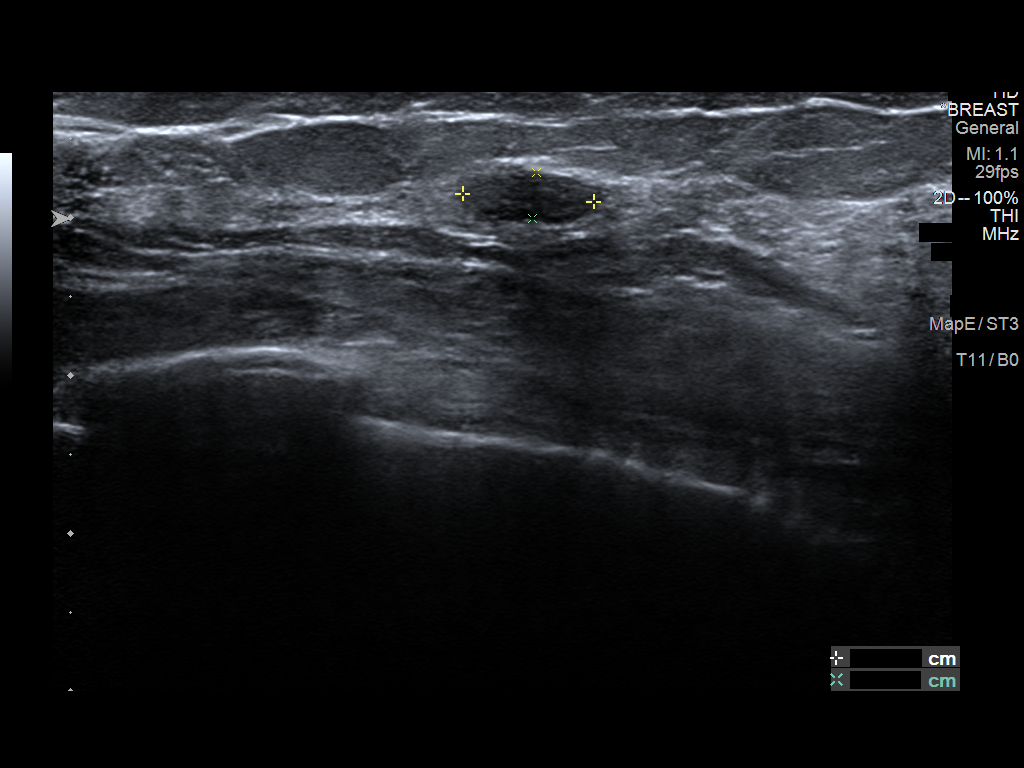
[im 3/5]
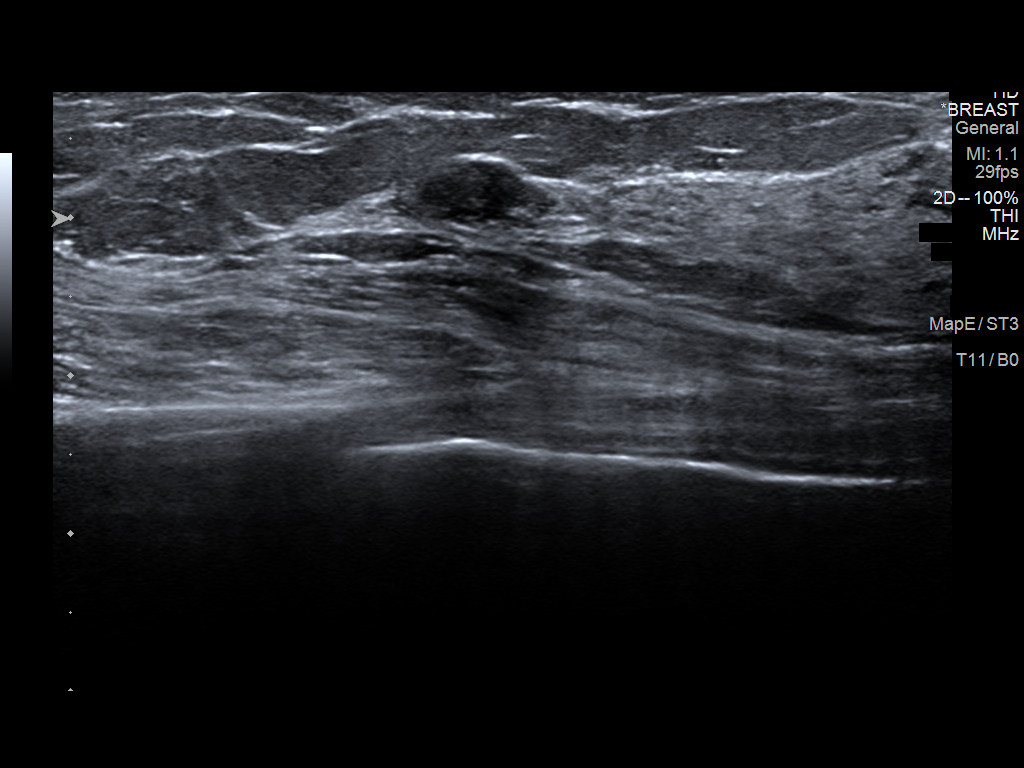
[im 4/5]
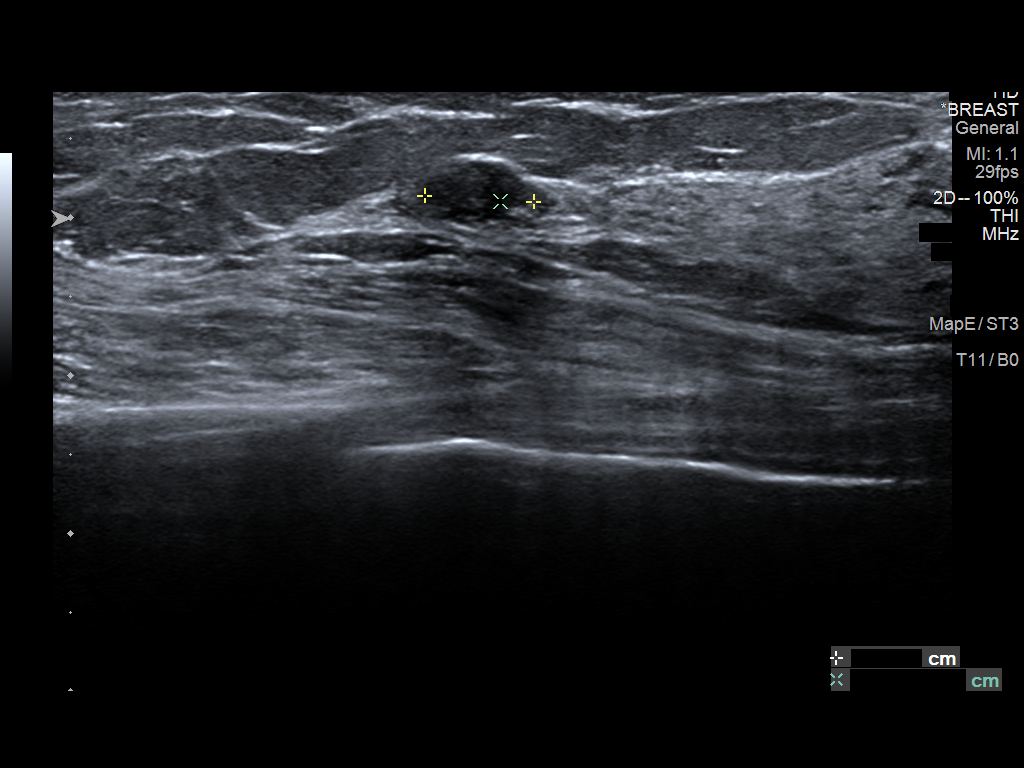
[im 5/5]
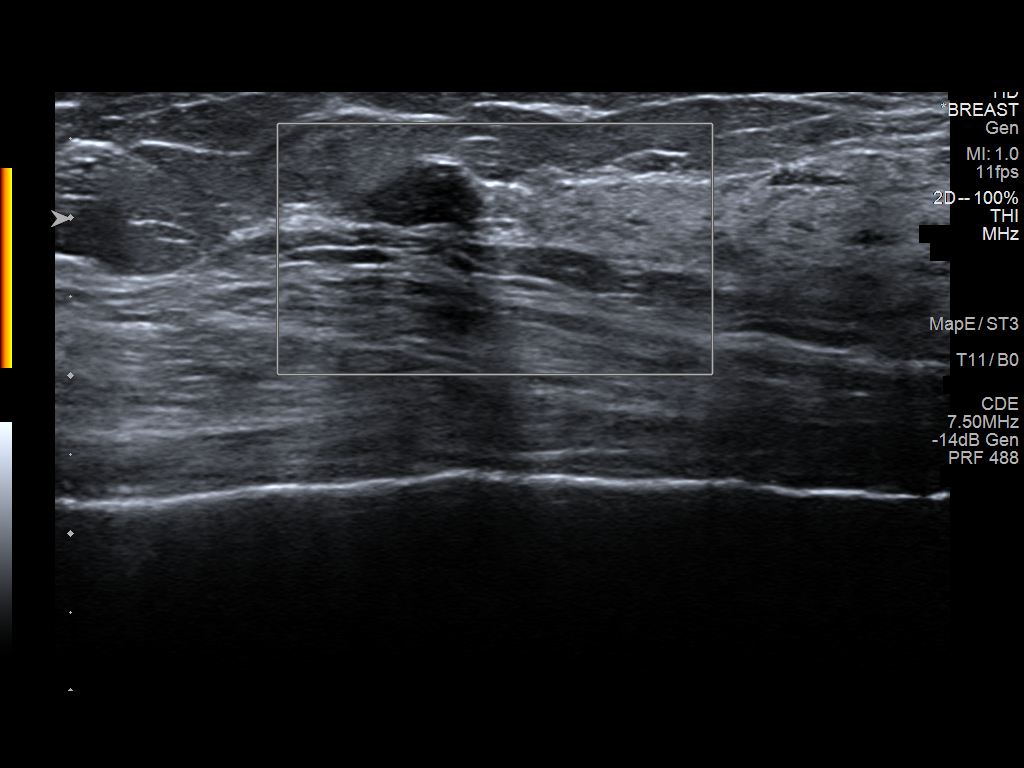

[5 of 5 positions shown; findings below may reference images not displayed]

ACR Breast Density Category c: The breast tissue is heterogeneously
dense, which may obscure small masses.
FINDINGS: No suspicious masses or calcifications are seen in either breast.
The probably benign mass in the outer right breast best seen on the
CC tomosynthesis appears unchanged.

Mammographic images were processed with CAD.

Physical examination of the outer right breast is not reveal any
palpable masses.

Targeted ultrasound of the right breast was performed demonstrating
an oval well-circumscribed hypoechoic mass at 11 o'clock 5 cm from
the nipple measuring 0.8 x 0.3 by 0.7 cm. This is overall unchanged
in size and appearance given differences in imaging in measurement
techniques.
IMPRESSION: 1.  Benign right breast mass, unchanged dating back to 09/04/2012.

2.  No mammographic evidence of malignancy in either breast.

RECOMMENDATION:
Screening mammogram in one year.(Code:PW-9-PVP)

I have discussed the findings and recommendations with the patient.
Results were also provided in writing at the conclusion of the
visit. If applicable, a reminder letter will be sent to the patient
regarding the next appointment.

BI-RADS CATEGORY  2: Benign.

## 2016-05-15 MED ORDER — AZITHROMYCIN 250 MG PO TABS
500.0000 mg | ORAL_TABLET | Freq: Once | ORAL | Status: AC
  Administered 2016-05-15: 500 mg via ORAL
  Filled 2016-05-15: qty 2

## 2016-05-15 MED ORDER — AZITHROMYCIN 250 MG PO TABS: 250.0000 mg | ORAL_TABLET | Freq: Every day | ORAL | Status: DC

## 2016-05-15 MED ORDER — POTASSIUM CHLORIDE CRYS ER 20 MEQ PO TBCR
40.0000 meq | EXTENDED_RELEASE_TABLET | Freq: Once | ORAL | Status: AC
  Administered 2016-05-15: 40 meq via ORAL
  Filled 2016-05-15: qty 2

## 2016-05-15 NOTE — Discharge Instructions (Signed)
Community-Acquired Pneumonia, Adult Take the medication as prescribed. Call your primary care physician at the [facility] clinic to arrange to get your blood pressure rechecked in a week and to get your blood potassium rechecked in a week. Today's blood pressure was 181/96, which is elevated. Your blood potassium was 2.8, which is low. You were given additional potassium here. Also ask your doctor to recheck a chest x-ray on you in the next 3 or 4 weeks to make sure that pneumonia is clearing Pneumonia is an infection of the lungs. One type of pneumonia can happen while a person is in a hospital. A different type can happen when a person is not in a hospital (community-acquired pneumonia). It is easy for this kind to spread from person to person. It can spread to you if you breathe near an infected person who coughs or sneezes. Some symptoms include:  A dry cough.  A wet (productive) cough.  Fever.  Sweating.  Chest pain. HOME CARE  Take over-the-counter and prescription medicines only as told by your doctor.  Only take cough medicine if you are losing sleep.  If you were prescribed an antibiotic medicine, take it as told by your doctor. Do not stop taking the antibiotic even if you start to feel better.  Sleep with your head and neck raised (elevated). You can do this by putting a few pillows under your head, or you can sleep in a recliner.  Do not use tobacco products. These include cigarettes, chewing tobacco, and e-cigarettes. If you need help quitting, ask your doctor.  Drink enough water to keep your pee (urine) clear or pale yellow. A shot (vaccine) can help prevent pneumonia. Shots are often suggested for:  People older than 63 years of age.  People older than 63 years of age:  Who are having cancer treatment.  Who have long-term (chronic) lung disease.  Who have problems with their body's defense system (immune system). You may also prevent pneumonia if you take these  actions:  Get the flu (influenza) shot every year.  Go to the dentist as often as told.  Wash your hands often. If soap and water are not available, use hand sanitizer. GET HELP IF:  You have a fever.  You lose sleep because your cough medicine does not help. GET HELP RIGHT AWAY IF:  You are short of breath and it gets worse.  You have more chest pain.  Your sickness gets worse. This is very serious if:  You are an older adult.  Your body's defense system is weak.  You cough up blood.   This information is not intended to replace advice given to you by your health care provider. Make sure you discuss any questions you have with your health care provider.   Document Released: 05/21/2008 Document Revised: 08/24/2015 Document Reviewed: 03/30/2015 Elsevier Interactive Patient Education [employer].

## 2016-05-15 NOTE — ED Provider Notes (Signed)
CSN: [id number]     Arrival date & time 05/15/16  1111 History   First MD Initiated Contact with Patient 05/15/16 1126     Chief Complaint  Patient presents with  . Cough     (Consider location/radiation/quality/duration/timing/severity/associated sxs/prior Treatment) HPI Presents with cough productive of green sputum for one month. Associated symptoms include left-sided chest pain rating to back , worse with coughing for the past 3 days. She also reports fever 101 three days ago. She's treated self with over-the-counter cough and cold medicine, without relief. No other associated symptoms. Symptoms improved with remaining still. And improved when not coughing. Past Medical History  Diagnosis Date  . Allergy   . Anemia   . Anxiety   . Depression   . Neuromuscular disorder ([place])   . Hypertension   . Arthritis    Past Surgical History  Procedure Laterality Date  . Appendectomy    . Breast surgery    . Tubal ligation    . Abdominal hysterectomy    . Knee surgery  2005    2 times left   Family History  Problem Relation Age of Onset  . Cancer Mother     breast and ovarian  . Hypertension Father   . Hyperlipidemia Father   . Cancer Maternal Grandmother     breast  . Cancer Paternal Grandmother   . Cancer Paternal Grandfather   . Cancer Maternal Aunt     breast  . Cancer Maternal Aunt     breast   Social History  Substance Use Topics  . Smoking status: Current Every Day Smoker    Types: Cigarettes  . Smokeless tobacco: Never Used  . Alcohol Use: No  Ex-smoker quit 1 month ago OB History    No data available     Review of Systems  Constitutional: Positive for fever.  HENT: Negative.   Respiratory: Positive for cough.   Cardiovascular: Positive for chest pain.       Chest pain with cough  Gastrointestinal: Negative.   Musculoskeletal: Negative.   Skin: Negative.   Neurological: Negative.   Psychiatric/Behavioral: Positive for dysphoric mood.       Currently  inpatient at [facility] for depression  All other systems reviewed and are negative.     Allergies  Demerol  Home Medications   Prior to Admission medications   Medication Sig Start Date End Date Taking? Authorizing Provider  acetaminophen (TYLENOL) 500 MG tablet Take 1,000 mg by mouth every 6 (six) hours as needed for moderate pain (pain).     Historical Provider, MD  albuterol (PROVENTIL HFA;VENTOLIN HFA) 108 (90 BASE) MCG/ACT inhaler Inhale 2 puffs into the lungs every 4 (four) hours as needed for wheezing or shortness of breath. 02/24/15   Shanker Kristeen Mans, MD  ARIPiprazole (ABILIFY) 10 MG tablet Take 10 mg by mouth daily.    Historical Provider, MD  azithromycin (ZITHROMAX Z-PAK) 250 MG tablet Take as directed on pack 06/13/15   Billy Fischer, MD  Diclofenac Sodium 3 % GEL Apply to affected area up to 4 times daily Patient not taking: Reported on 12/11/2014 05/26/14   Waldemar Dickens, MD  doxycycline (VIBRAMYCIN) 100 MG capsule Take 1 capsule (100 mg total) by mouth 2 (two) times daily. One po bid x 7 days 09/02/15   Julianne Rice, MD  DULoxetine (CYMBALTA) 20 MG capsule Take 20 mg by mouth 2 (two) times daily.    Historical Provider, MD  gabapentin (NEURONTIN) 100 MG capsule Take  100 mg by mouth 3 (three) times daily.    Historical Provider, MD  guaiFENesin (MUCINEX) 600 MG 12 hr tablet Take 1 tablet (600 mg total) by mouth 2 (two) times daily. 02/24/15   Shanker Kristeen Mans, MD  ipratropium (ATROVENT) 0.06 % nasal spray Place 2 sprays into both nostrils 4 (four) times daily. 06/13/15   Billy Fischer, MD  levofloxacin (LEVAQUIN) 500 MG tablet Take 1 tablet (500 mg total) by mouth daily. 02/24/15   Shanker Kristeen Mans, MD  meloxicam (MOBIC) 15 MG tablet Take 1 tablet (15 mg total) by mouth daily. Take 1 daily with food. 11/30/15   Margarita Mail, PA-C  nicotine (NICODERM CQ - DOSED IN MG/24 HOURS) 21 mg/24hr patch Place 1 patch (21 mg total) onto the skin daily. 02/24/15   Shanker Kristeen Mans, MD   ondansetron (ZOFRAN ODT) 4 MG disintegrating tablet 4mg  ODT q4 hours prn nausea/vomit 09/02/15   Julianne Rice, MD  oseltamivir (TAMIFLU) 75 MG capsule Take 1 capsule (75 mg total) by mouth 2 (two) times daily. 02/24/15   Shanker Kristeen Mans, MD  predniSONE (DELTASONE) 10 MG tablet Take 4 tablets (40 mg) daily for 1 days, then, Take 3 tablets (30 mg) daily for 1 days, then, Take 2 tablets (20 mg) daily for 1 day, then, Take 1 tablets (10 mg) daily for 1 days, then stop 02/24/15   Jonetta Osgood, MD  traZODone (DESYREL) 100 MG tablet Take 200 mg by mouth at bedtime.    Historical Provider, MD   BP 173/84 mmHg  Pulse 83  Temp(Src) 98.4 F (36.9 C) (Oral)  Resp 19  Wt 201 lb 12.8 oz (91.536 kg)  SpO2 94% Physical Exam  Constitutional: She appears well-developed and well-nourished. No distress.  HENT:  Head: Normocephalic and atraumatic.  Eyes: Conjunctivae are normal. Pupils are equal, round, and reactive to light.  Neck: Neck supple. No tracheal deviation present. No thyromegaly present.  Cardiovascular: Normal rate and regular rhythm.   No murmur heard. Pulmonary/Chest: Effort normal and breath sounds normal. No respiratory distress.  Coughing occasionally. Diffuse rhonchi which clear with coughing  Abdominal: Soft. Bowel sounds are normal. She exhibits no distension. There is no tenderness.  Musculoskeletal: Normal range of motion. She exhibits no edema or tenderness.  Neurological: She is alert. Coordination normal.  Skin: Skin is warm and dry. No rash noted.  Psychiatric: She has a normal mood and affect.  Nursing note and vitals reviewed.   ED Course  Procedures (including critical care time) Labs Review Labs Reviewed - No data to display  Imaging Review No results found. I have personally reviewed and evaluated these images and lab results as part of my medical decision-making.   EKG Interpretation None     4:40 PM patient resting comfortably. Speaking on cell  phone. No respiratory distress. Chest x-ray viewed by me. Results for orders placed or performed during the hospital encounter of 05/15/16  CBC with Differential/Platelet  Result Value Ref Range   WBC 5.3 4.0 - 10.5 K/uL   RBC 3.99 3.87 - 5.11 MIL/uL   Hemoglobin 10.1 (L) 12.0 - 15.0 g/dL   HCT 32.7 (L) 36.0 - 46.0 %   MCV 82.0 78.0 - 100.0 fL   MCH 25.3 (L) 26.0 - 34.0 pg   MCHC 30.9 30.0 - 36.0 g/dL   RDW 15.0 11.5 - 15.5 %   Platelets 328 150 - 400 K/uL   Neutrophils Relative % 45 %   Neutro Abs 2.4 1.7 -  7.7 K/uL   Lymphocytes Relative 48 %   Lymphs Abs 2.5 0.7 - 4.0 K/uL   Monocytes Relative 4 %   Monocytes Absolute 0.2 0.1 - 1.0 K/uL   Eosinophils Relative 3 %   Eosinophils Absolute 0.1 0.0 - 0.7 K/uL   Basophils Relative 0 %   Basophils Absolute 0.0 0.0 - 0.1 K/uL  I-Stat Chem 8, ED  Result Value Ref Range   Sodium 142 135 - 145 mmol/L   Potassium 2.8 (L) 3.5 - 5.1 mmol/L   Chloride 99 (L) 101 - 111 mmol/L   BUN 15 6 - 20 mg/dL   Creatinine, Ser 0.80 0.44 - 1.00 mg/dL   Glucose, Bld 97 65 - 99 mg/dL   Calcium, Ion 1.07 (L) 1.13 - 1.30 mmol/L   TCO2 30 0 - 100 mmol/L   Hemoglobin 10.9 (L) 12.0 - 15.0 g/dL   HCT 32.0 (L) 36.0 - 46.0 %   Dg Chest 2 View  05/15/2016  CLINICAL DATA:  Productive cough for 1 month EXAM: CHEST  2 VIEW COMPARISON:  02/22/2015 FINDINGS: Cardiac shadow is within normal limits. Lingular infiltrate is seen. No sizable effusion is noted. Apical scarring is noted bilaterally. No bony abnormality is seen. IMPRESSION: Lingular infiltrate. Followup PA and lateral chest X-ray is recommended in 3-4 weeks following trial of antibiotic therapy to ensure resolution and exclude underlying malignancy. Electronically Signed   By: Inez Catalina M.D.   On: 05/15/2016 12:53    MDM  She received first dose of azithromycin here prior to discharge. Also received potassium supplementation orally. Plan prescription azithromycin. Contact primary care physician to  schedule repeat chest x-ray in 3-4 weeks and repeat blood pressure and recheck serum potassium in one week Diagnosis #1 community-acquired pneumonia #2 elevated blood pressure #3 hypokalemia Final diagnoses:  None        Orlie Dakin, MD 05/15/16 [phone]

## 2016-05-15 NOTE — ED Notes (Signed)
Pt sts cough x 1 month productive in nature with green sputum; pt sts pain with cough and some fever at times

## 2016-06-10 IMAGING — CR DG CHEST 2V
2 series · 2 of 2 positions shown · non-contrast
Comparison: 12/11/2014.

CLINICAL DATA: Chills.  Body aches.  Fever.

EXAM:
CHEST  2 VIEW

[w chest pa]
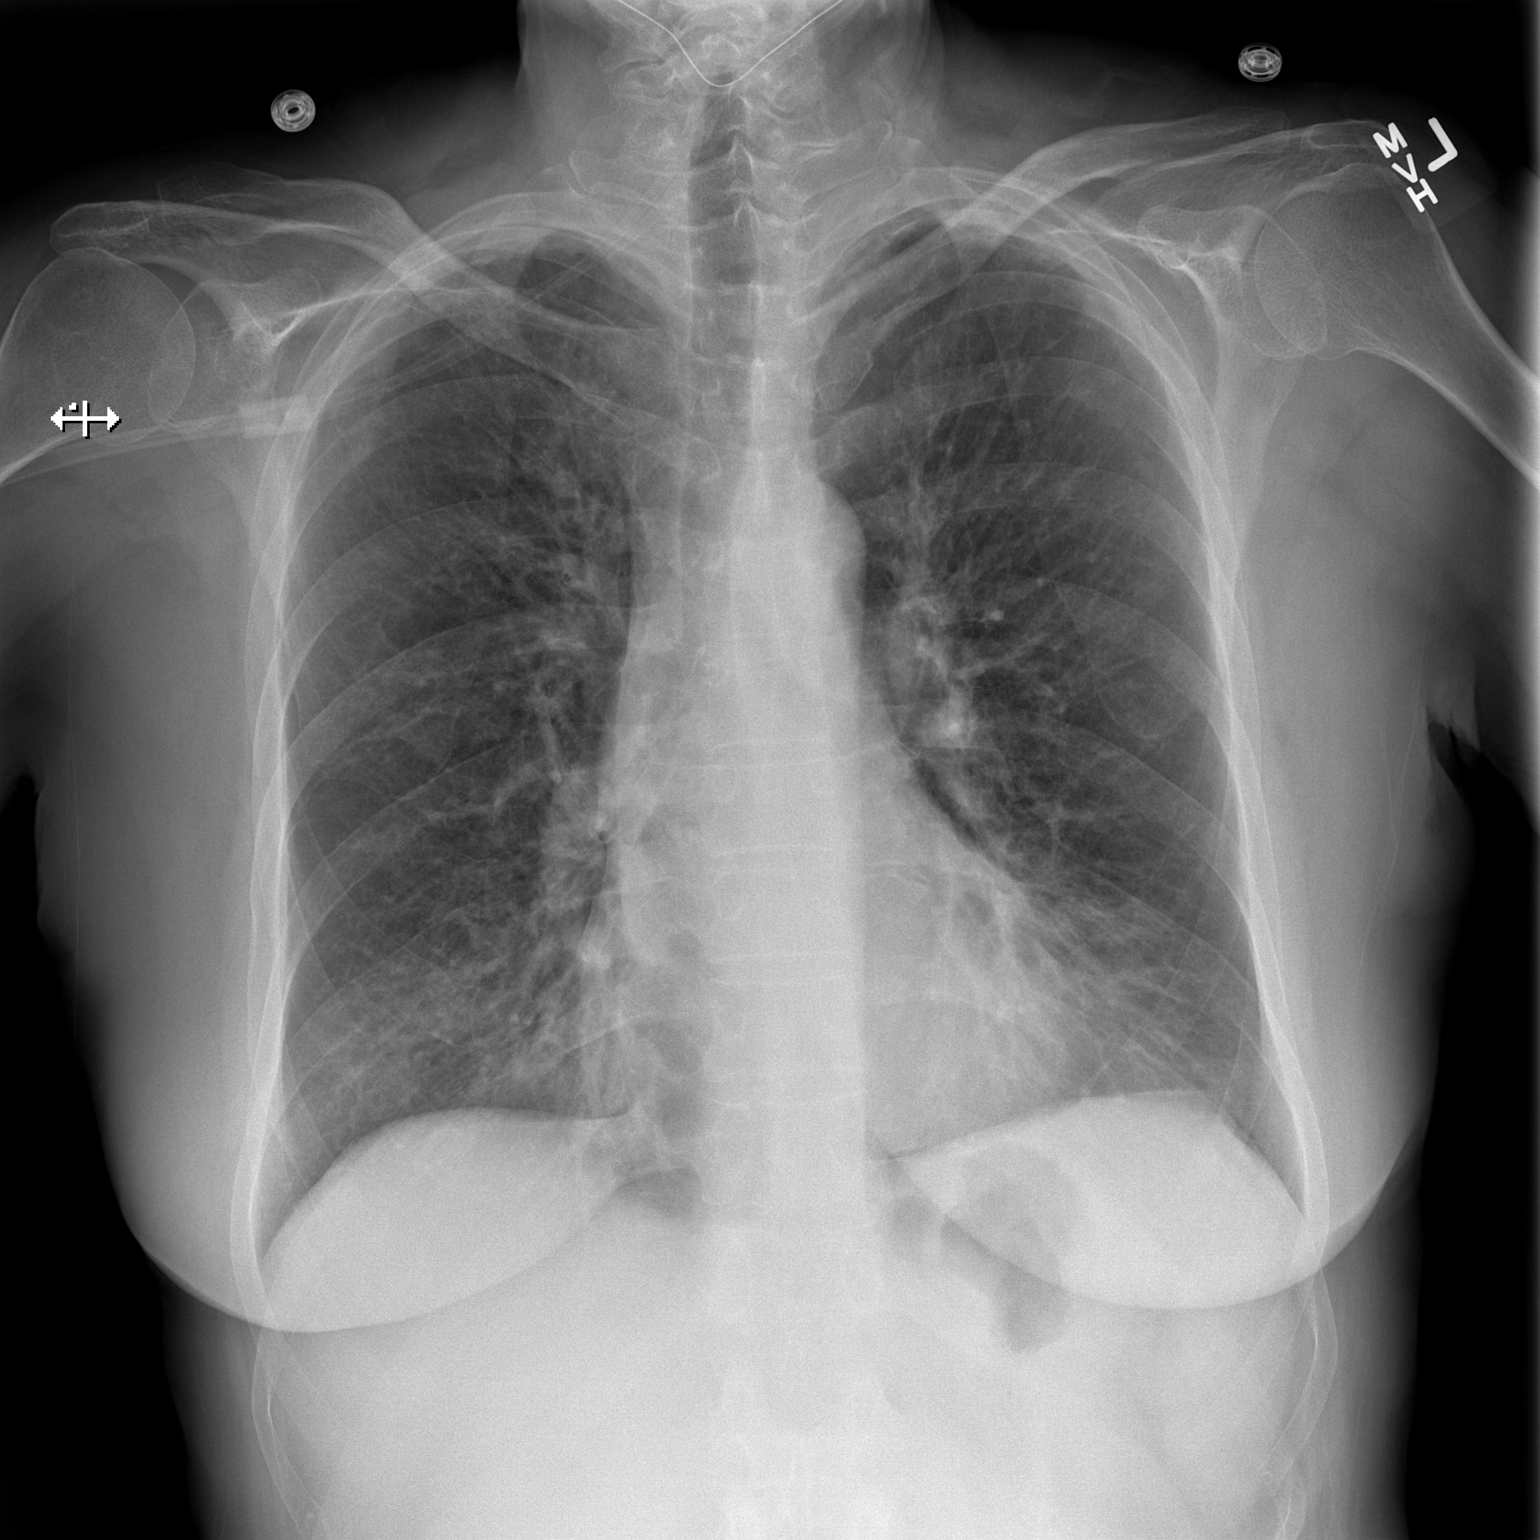

[w chest lat]
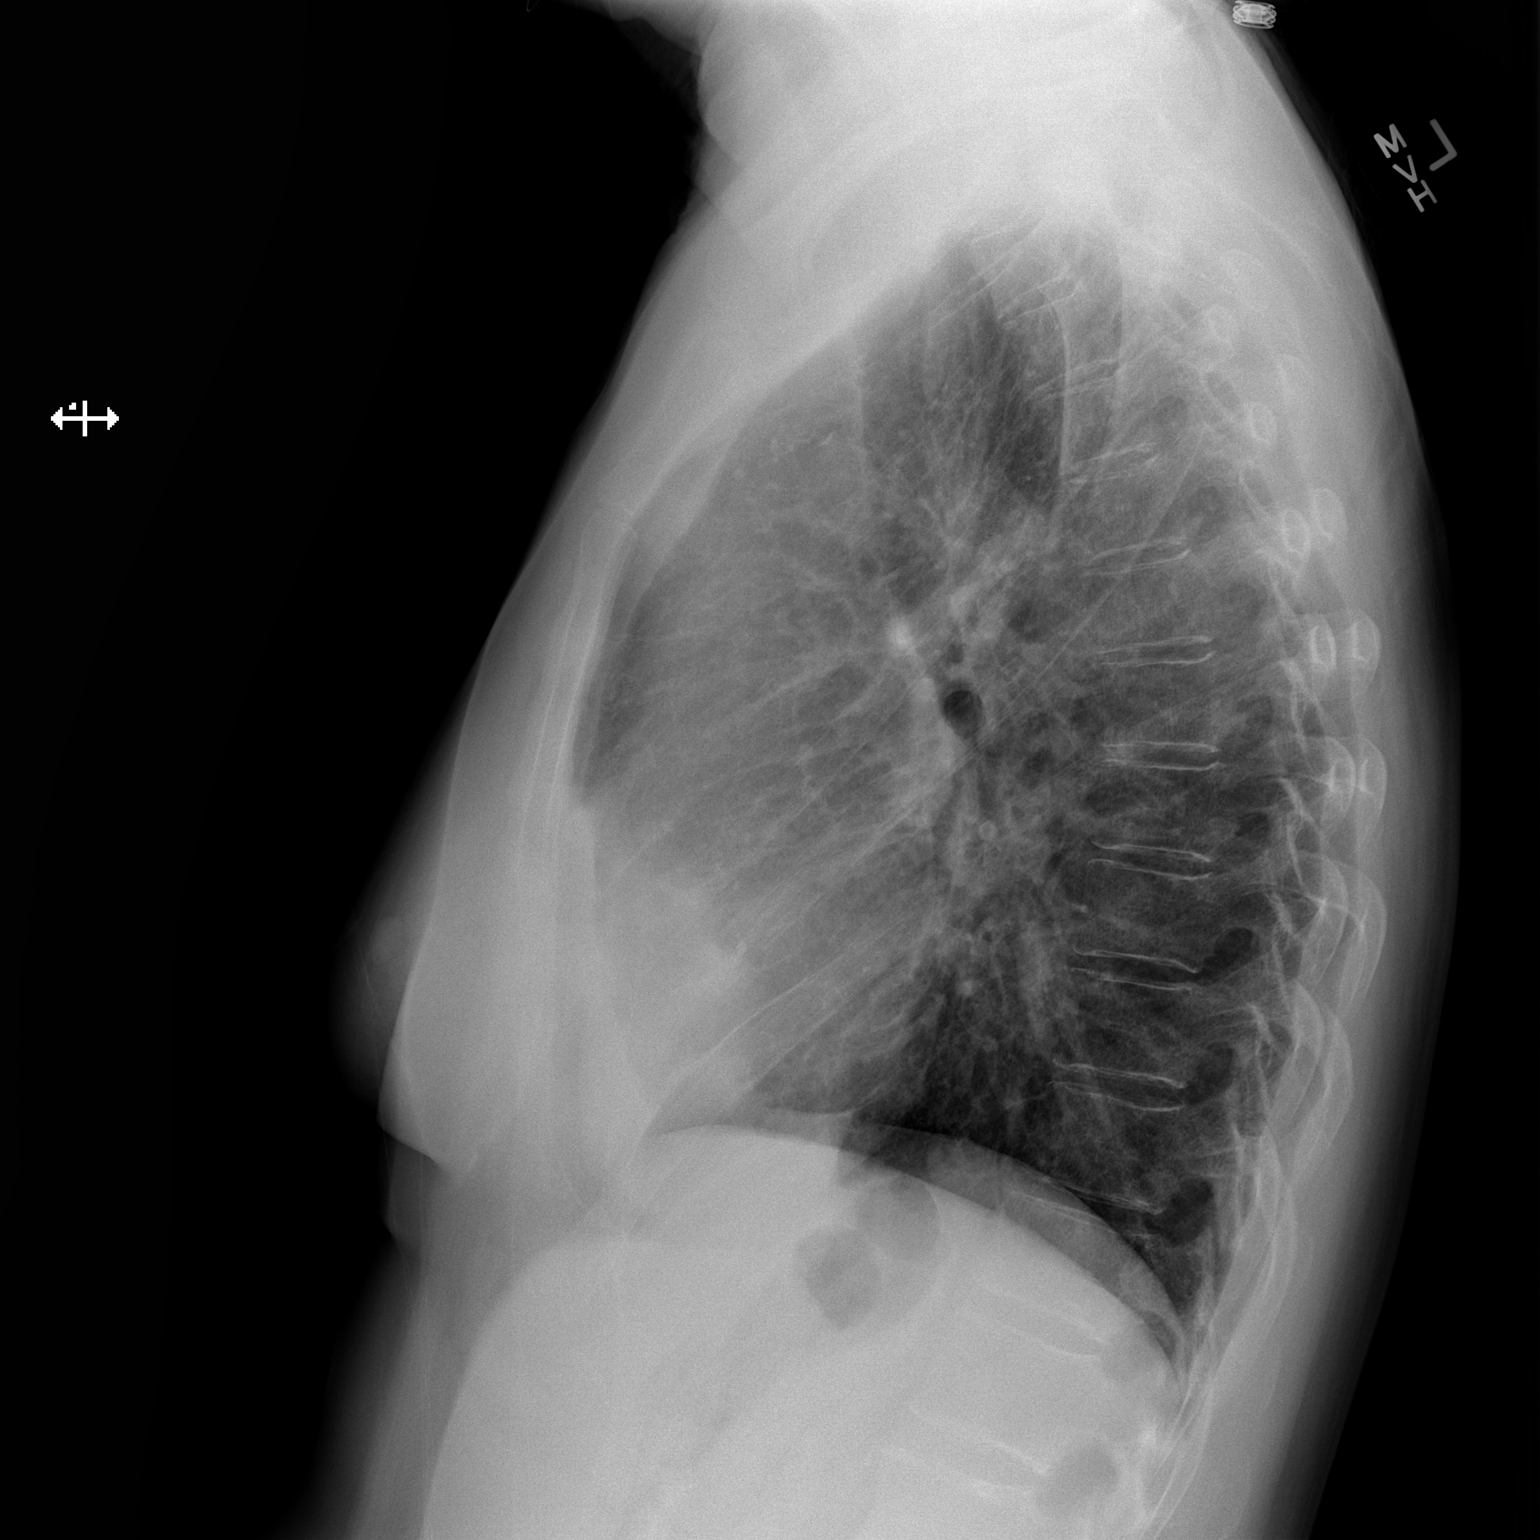

[2 of 2 positions shown; findings below may reference images not displayed]

FINDINGS: The chest radiograph appears very similar to the prior examination.
Cardiopericardial silhouette is within normal limits. Mediastinal
contours are normal. There is diffuse prominence of the interstitial
markings. The pattern is suggestive of interstitial lung disease.
Interstitial pulmonary edema is less likely. No Kerley B-lines are
identified. There is no focal consolidation.
IMPRESSION: Similar appearance of the chest to the prior exam of 12/11/2014.
Diffuse interstitial opacities are present, which may represent
atypical infection such as mycoplasma or chronic interstitial lung
disease.

## 2016-07-02 ENCOUNTER — Other Ambulatory Visit: Payer: Self-pay | Admitting: Nurse Practitioner

## 2016-07-02 DIAGNOSIS — Z1231 Encounter for screening mammogram for malignant neoplasm of breast: Secondary | ICD-10-CM

## 2016-07-13 ENCOUNTER — Ambulatory Visit
Admission: RE | Admit: 2016-07-13 | Discharge: 2016-07-13 | Disposition: A | Discharge status: Home / Self Care | Payer: Medicare Other | Source: Ambulatory Visit | Attending: Nurse Practitioner | Admitting: Nurse Practitioner

## 2016-07-13 DIAGNOSIS — Z1231 Encounter for screening mammogram for malignant neoplasm of breast: Secondary | ICD-10-CM

## 2016-07-22 DIAGNOSIS — F32A Depression, unspecified: Secondary | ICD-10-CM | POA: Insufficient documentation

## 2017-03-18 IMAGING — CR DG FOOT COMPLETE 3+V*R*
3 series · 3 of 3 positions shown · non-contrast
Comparison: None.

CLINICAL DATA: Bruising of the base of the toes scratch the
bruising about the proximal right toes after a blow to the right
foot today. Initial encounter.

EXAM:
RIGHT FOOT COMPLETE - 3+ VIEW

[AP]
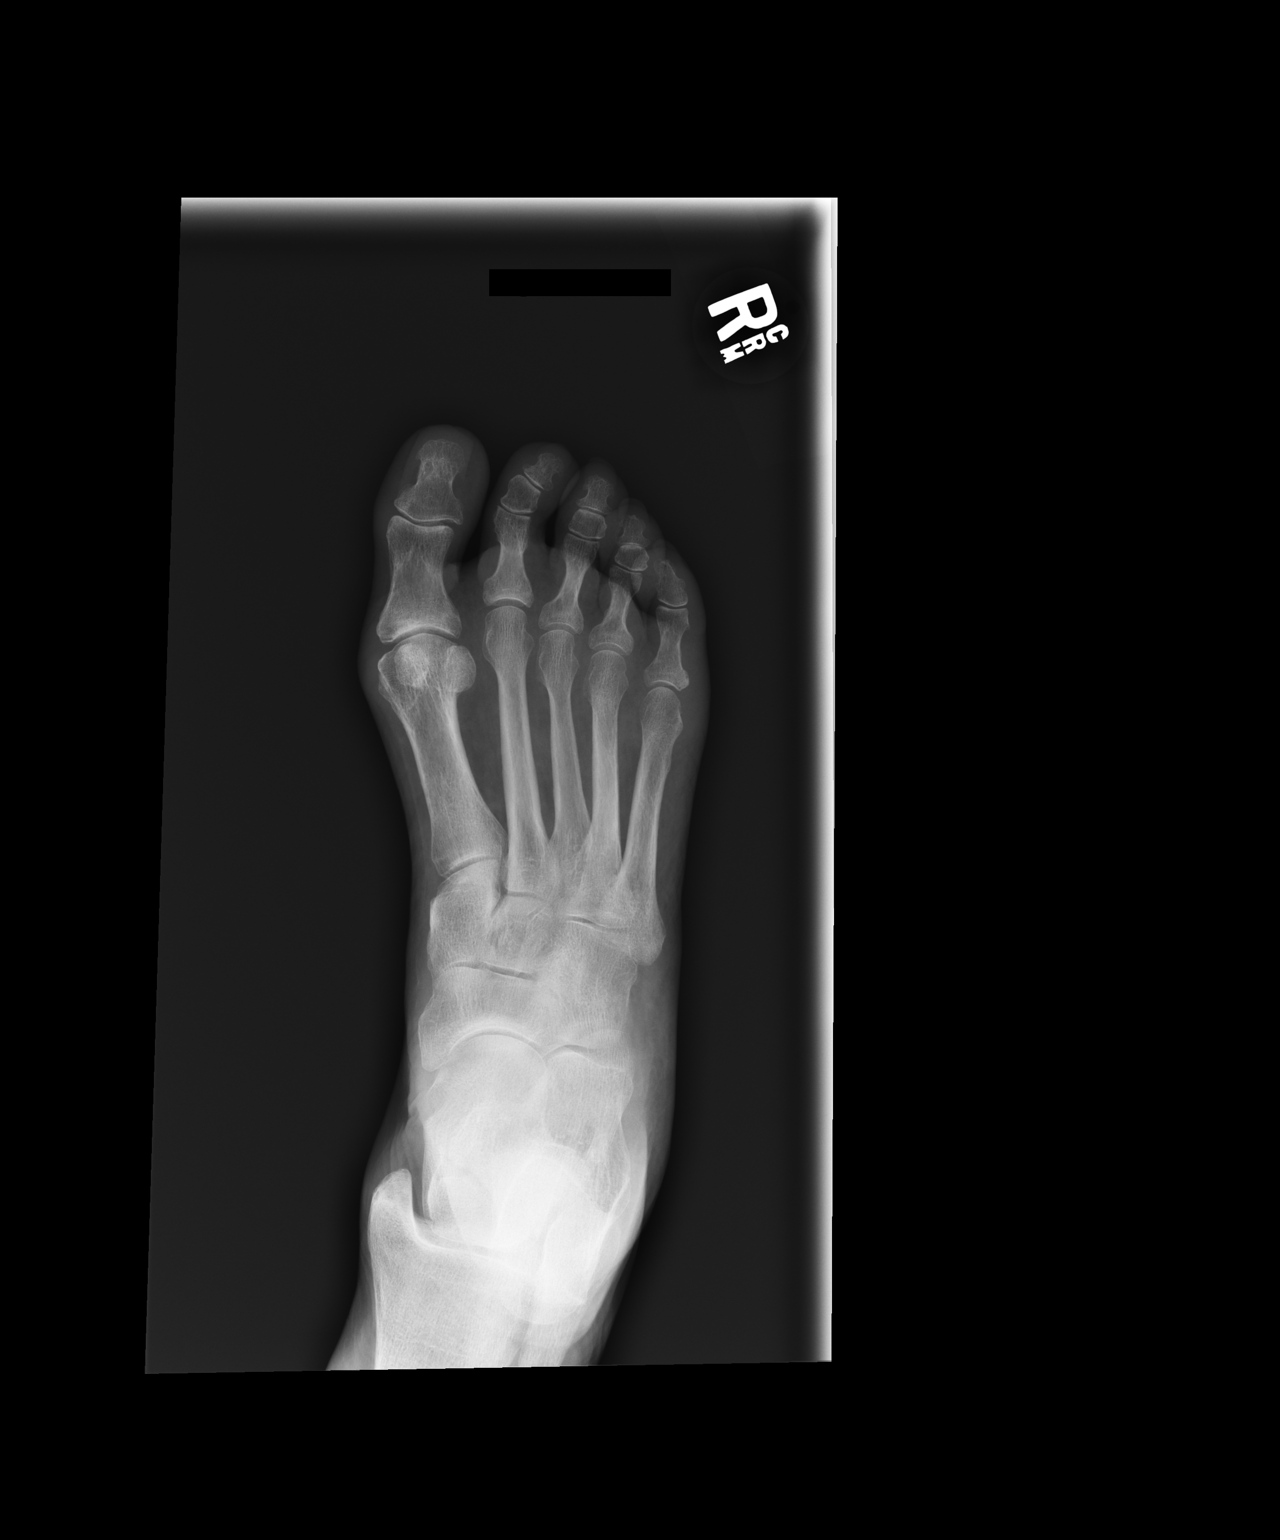

[ap obl int rot]
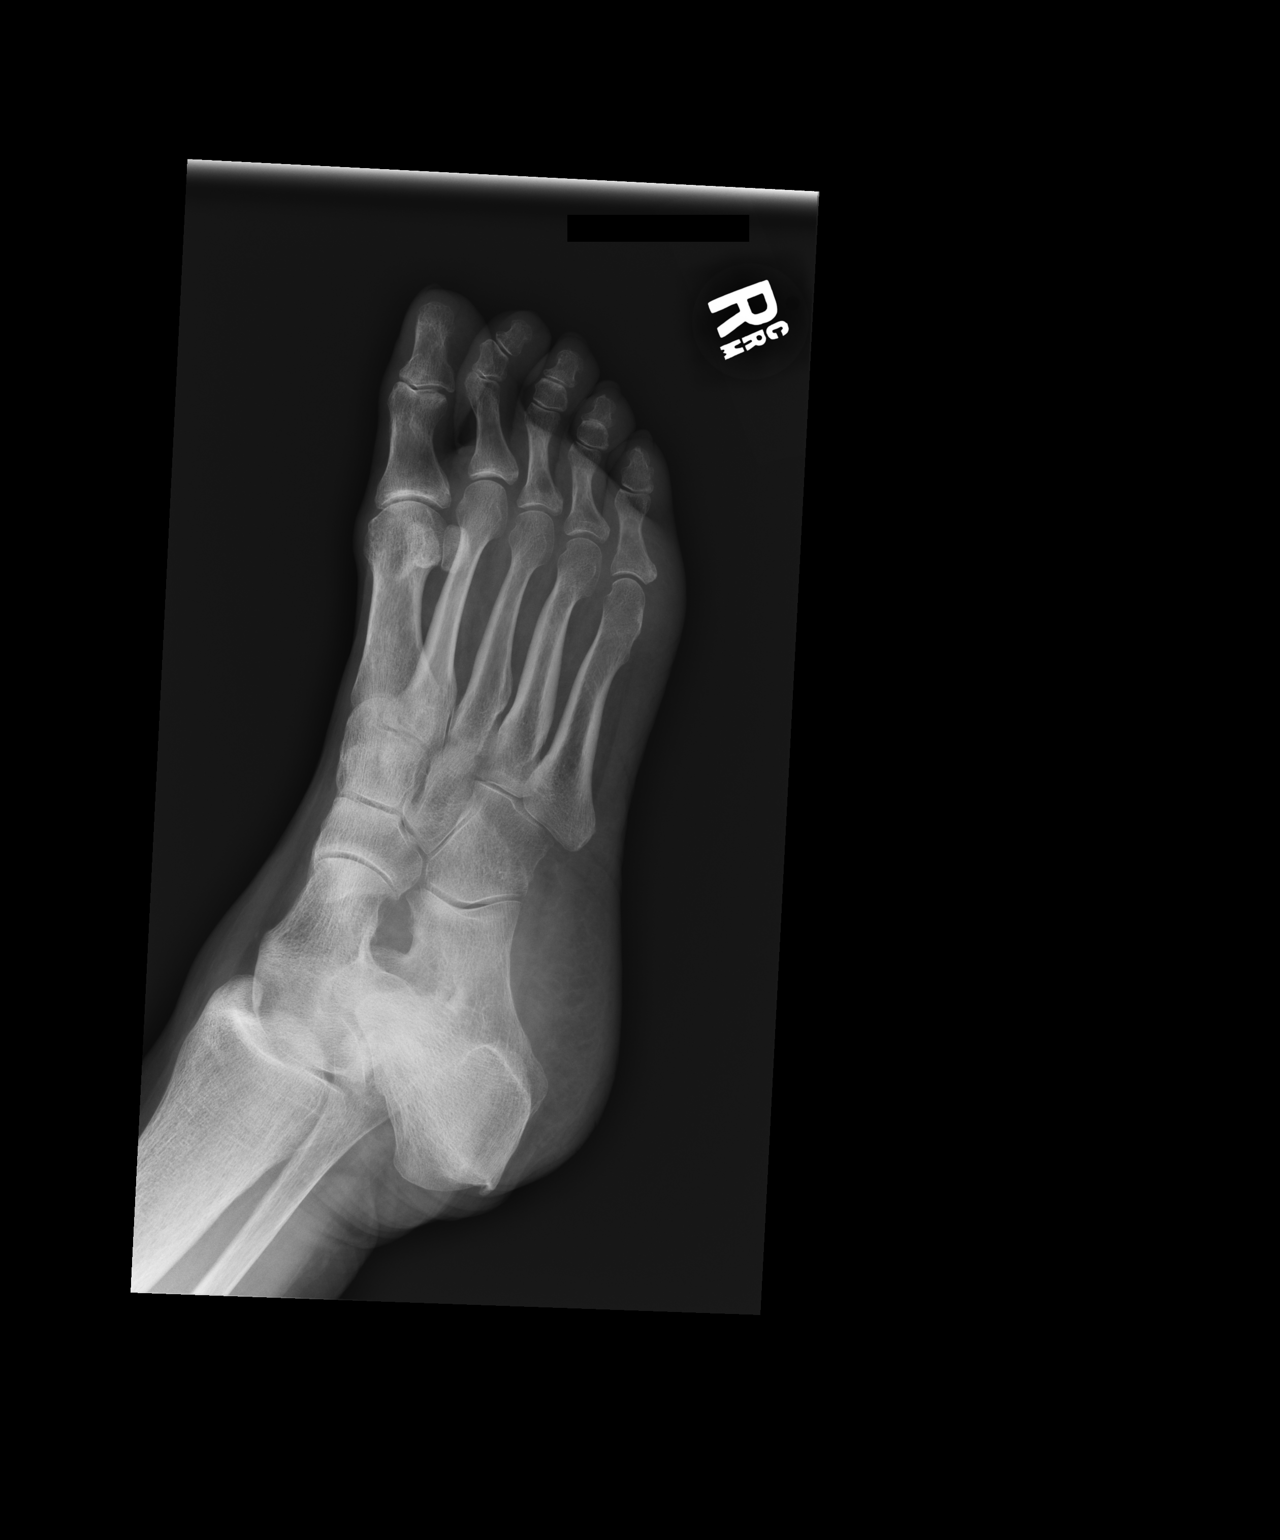

[lateral]
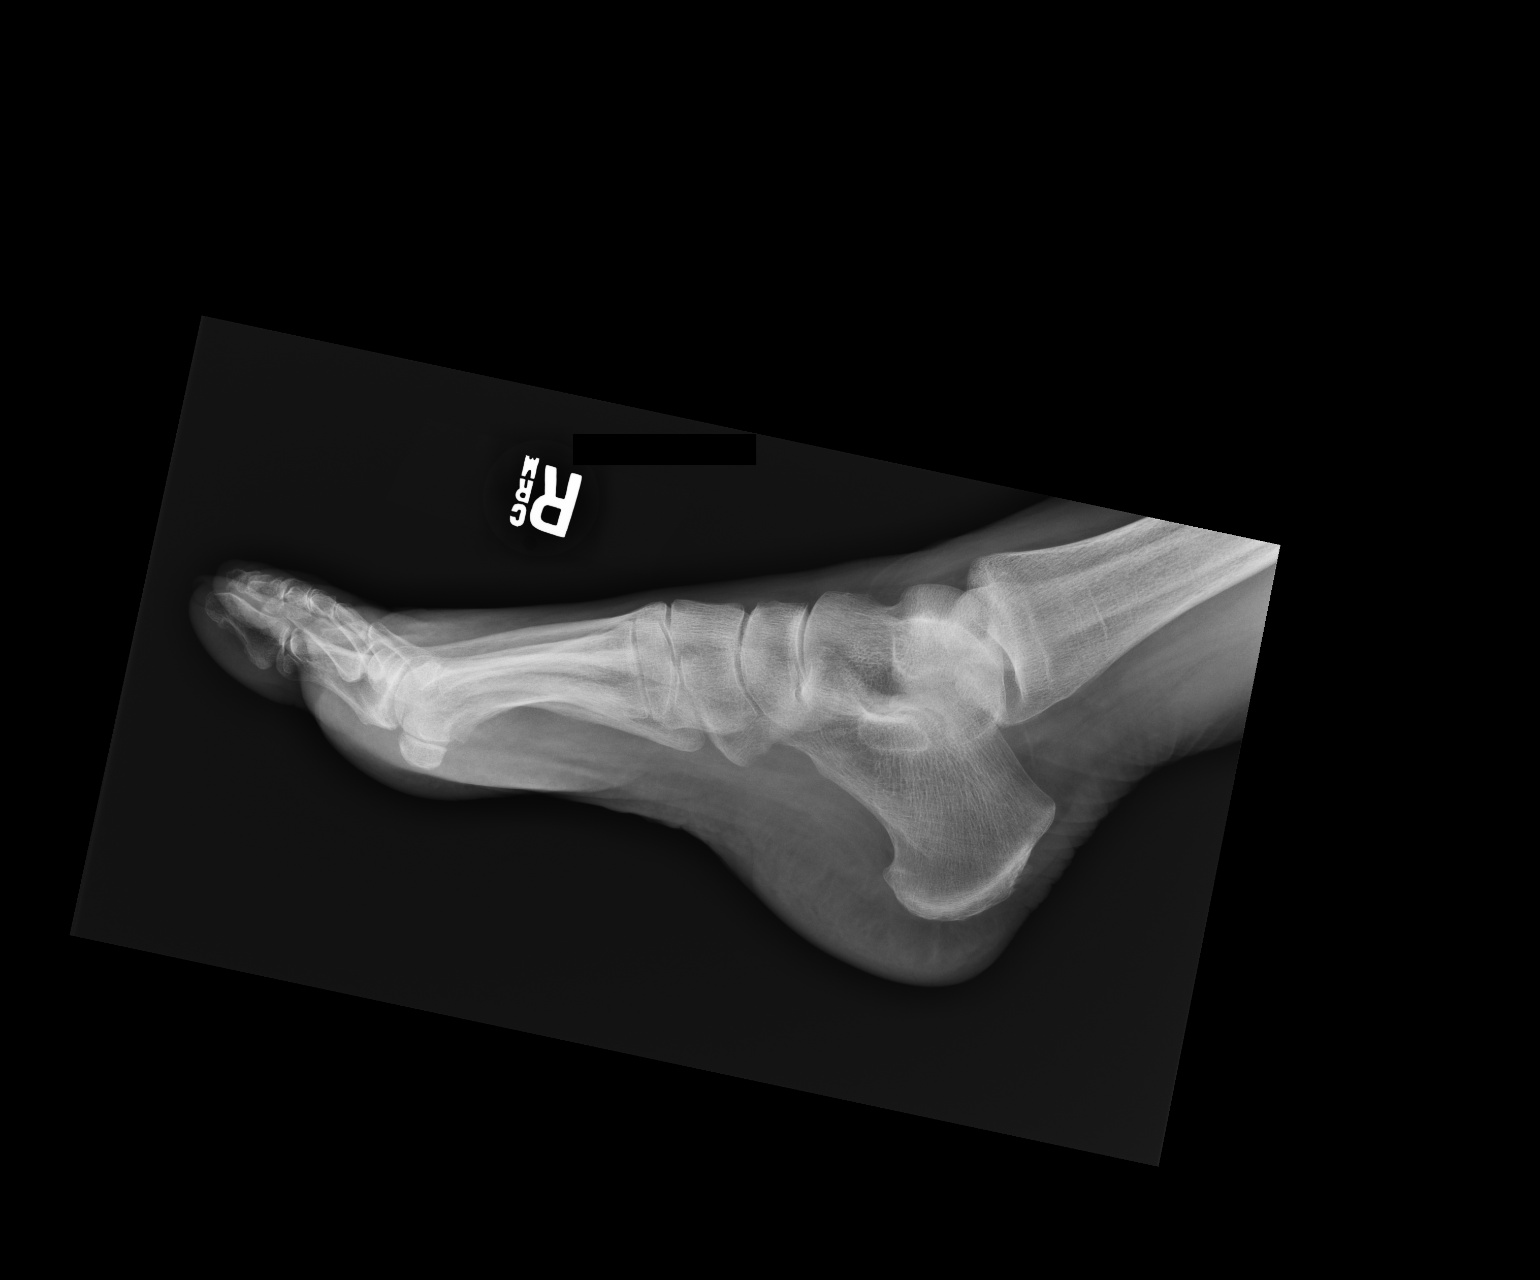

[3 of 3 positions shown; findings below may reference images not displayed]

FINDINGS: There is no evidence of fracture or dislocation. There is no
evidence of arthropathy or other focal bone abnormality. Soft
tissues are unremarkable.
IMPRESSION: Negative exam.

## 2017-05-25 ENCOUNTER — Ambulatory Visit (HOSPITAL_COMMUNITY)
Admission: EM | Admit: 2017-05-25 | Discharge: 2017-05-25 | Disposition: A | Discharge status: Home / Self Care | Payer: Medicare HMO | Attending: Internal Medicine | Admitting: Internal Medicine

## 2017-05-25 ENCOUNTER — Encounter (HOSPITAL_COMMUNITY): Payer: Self-pay | Admitting: Emergency Medicine

## 2017-05-25 DIAGNOSIS — R350 Frequency of micturition: Secondary | ICD-10-CM | POA: Insufficient documentation

## 2017-05-25 DIAGNOSIS — R1030 Lower abdominal pain, unspecified: Secondary | ICD-10-CM | POA: Insufficient documentation

## 2017-05-25 DIAGNOSIS — R300 Dysuria: Secondary | ICD-10-CM | POA: Diagnosis not present

## 2017-05-25 DIAGNOSIS — N3001 Acute cystitis with hematuria: Secondary | ICD-10-CM | POA: Diagnosis not present

## 2017-05-25 LAB — POCT URINALYSIS DIP (DEVICE)
GLUCOSE, UA: NEGATIVE mg/dL
Ketones, ur: NEGATIVE mg/dL
LEUKOCYTES UA: NEGATIVE
NITRITE: POSITIVE — AB
Protein, ur: 30 mg/dL — AB
Specific Gravity, Urine: >=1.03 (ref 1.005–1.030)
UROBILINOGEN UA: 0.2 mg/dL (ref 0.0–1.0)
pH: 5.5 (ref 5.0–8.0)

## 2017-05-25 MED ORDER — NITROFURANTOIN MONOHYD MACRO 100 MG PO CAPS: 100.0000 mg | ORAL_CAPSULE | Freq: Two times a day (BID) | ORAL | 0 refills | Status: DC

## 2017-05-25 NOTE — ED Provider Notes (Signed)
CSN: [id number]     Arrival date & time 05/25/17  1652 History   None    Chief Complaint  Patient presents with  . Urinary Tract Infection   (Consider location/radiation/quality/duration/timing/severity/associated sxs/prior Treatment) Patient is a 66 y.o. Female, here for UTI x 3 days with dysuria, urinary frequency, incomplete emptying of her bladder and lower abdominal pain. She denies fever, back pain or nausea. She denies abnormal vaginal discharge.       Past Medical History:  Diagnosis Date  . Allergy   . Anemia   . Anxiety   . Arthritis   . Depression   . Hypertension   . Neuromuscular disorder [facility])    Past Surgical History:  Procedure Laterality Date  . ABDOMINAL HYSTERECTOMY    . APPENDECTOMY    . BREAST SURGERY    . KNEE SURGERY  2005   2 times left  . TUBAL LIGATION     Family History  Problem Relation Age of Onset  . Cancer Mother        breast and ovarian  . Hypertension Father   . Hyperlipidemia Father   . Cancer Maternal Grandmother        breast  . Cancer Paternal Grandmother   . Cancer Paternal Grandfather   . Cancer Maternal Aunt        breast  . Cancer Maternal Aunt        breast   Social History  Substance Use Topics  . Smoking status: Current Every Day Smoker    Types: Cigarettes  . Smokeless tobacco: Never Used  . Alcohol use No   OB History    No data available     Review of Systems  Constitutional:       As stated in the HPI    Allergies  Demerol [meperidine]  Home Medications   Prior to Admission medications   Medication Sig Start Date End Date Taking? Authorizing Provider  albuterol (PROVENTIL HFA;VENTOLIN HFA) 108 (90 BASE) MCG/ACT inhaler Inhale 2 puffs into the lungs every 4 (four) hours as needed for wheezing or shortness of breath. 02/24/15  Yes Ghimire, Henreitta Leber, MD  ARIPiprazole (ABILIFY) 10 MG tablet Take 10 mg by mouth daily.   Yes [provider]  gabapentin (NEURONTIN) 100 MG capsule Take 100 mg by  mouth 3 (three) times daily.   Yes [provider]  acetaminophen (TYLENOL) 500 MG tablet Take 1,000 mg by mouth every 6 (six) hours as needed for moderate pain (pain).     [provider]  azithromycin (ZITHROMAX) 250 MG tablet Take 1 tablet (250 mg total) by mouth daily. 1 tablet daily starting 05/16/2016 05/15/16   Orlie Dakin, MD  Diclofenac Sodium 3 % GEL Apply to affected area up to 4 times daily Patient not taking: Reported on 12/11/2014 05/26/14   Waldemar Dickens, MD  doxycycline (VIBRAMYCIN) 100 MG capsule Take 1 capsule (100 mg total) by mouth 2 (two) times daily. One po bid x 7 days Patient not taking: Reported on 05/15/2016 09/02/15   Julianne Rice, MD  DULoxetine (CYMBALTA) 20 MG capsule Take 20 mg by mouth 2 (two) times daily.    [provider]  guaiFENesin (MUCINEX) 600 MG 12 hr tablet Take 1 tablet (600 mg total) by mouth 2 (two) times daily. Patient not taking: Reported on 05/15/2016 02/24/15   Jonetta Osgood, MD  ipratropium (ATROVENT) 0.06 % nasal spray Place 2 sprays into both nostrils 4 (four) times daily. Patient not taking:  Reported on 05/15/2016 06/13/15   Billy Fischer, MD  levofloxacin (LEVAQUIN) 500 MG tablet Take 1 tablet (500 mg total) by mouth daily. Patient not taking: Reported on 05/15/2016 02/24/15   Jonetta Osgood, MD  meloxicam (MOBIC) 15 MG tablet Take 1 tablet (15 mg total) by mouth daily. Take 1 daily with food. Patient not taking: Reported on 05/15/2016 11/30/15   Margarita Mail, PA-C  mirtazapine (REMERON) 15 MG tablet Take 15 mg by mouth at bedtime.    [provider]  nicotine (NICODERM CQ - DOSED IN MG/24 HOURS) 21 mg/24hr patch Place 1 patch (21 mg total) onto the skin daily. Patient not taking: Reported on 05/15/2016 02/24/15   Jonetta Osgood, MD  nitrofurantoin, macrocrystal-monohydrate, (MACROBID) 100 MG capsule Take 1 capsule (100 mg total) by mouth 2 (two) times daily. 05/25/17   Barry Dienes, NP   ondansetron (ZOFRAN ODT) 4 MG disintegrating tablet 4mg  ODT q4 hours prn nausea/vomit Patient not taking: Reported on 05/15/2016 09/02/15   Julianne Rice, MD  oseltamivir (TAMIFLU) 75 MG capsule Take 1 capsule (75 mg total) by mouth 2 (two) times daily. Patient not taking: Reported on 05/15/2016 02/24/15   Jonetta Osgood, MD  predniSONE (DELTASONE) 10 MG tablet Take 4 tablets (40 mg) daily for 1 days, then, Take 3 tablets (30 mg) daily for 1 days, then, Take 2 tablets (20 mg) daily for 1 day, then, Take 1 tablets (10 mg) daily for 1 days, then stop Patient not taking: Reported on 05/15/2016 02/24/15   Jonetta Osgood, MD  traZODone (DESYREL) 100 MG tablet Take 200 mg by mouth at bedtime.    [provider]   Meds Ordered and Administered this Visit  Medications - No data to display  BP (!) 153/87 (BP Location: Left Arm)   Pulse 67   Temp 98.5 F (36.9 C) (Oral)   Resp 16   SpO2 97%  No data found.   Physical Exam  Constitutional: She is oriented to person, place, and time. She appears well-developed and well-nourished.  Cardiovascular: Normal rate, regular rhythm and normal heart sounds.   Pulmonary/Chest: Effort normal. No respiratory distress. She has wheezes.  Abdominal: Soft. Bowel sounds are normal.  Suprapubic tenderness present.   Genitourinary:  Genitourinary Comments: Positive bilateral CVA Tenderness  Neurological: She is alert and oriented to person, place, and time.  Skin: Skin is warm and dry.  Nursing note and vitals reviewed.   Urgent Care Course     Procedures (including critical care time)  Labs Review Labs Reviewed  POCT URINALYSIS DIP (DEVICE) - Abnormal; Notable for the following:       Result Value   Bilirubin Urine SMALL (*)    Hgb urine dipstick TRACE (*)    Protein, ur 30 (*)    Nitrite POSITIVE (*)    All other components within normal limits  URINE CULTURE    Imaging Review No results found.  MDM   1. Acute cystitis  with hematuria    UA indicative of UTI. Urine culture pending. Macrobid BID x 5 days given.   Informed that she has wheezes; patient states that she stays wheezing due to COPD. She denies SOB.   Patient instructed to call or follow up with his/her primary care doctor if failure to improve or change in symptoms. Discharge instruction given.     Barry Dienes, NP 05/25/17 [phone]

## 2017-05-25 NOTE — ED Triage Notes (Signed)
Pt here for UTI sx onset 2 days associated w/dysuria, urinary freq/urgency, diarrhea  Denies fevers, chills, n/v, hematuria  A&O x4... NAD.[facility]

## 2017-05-28 LAB — URINE CULTURE: Culture: >=100000 — AB

## 2017-06-17 ENCOUNTER — Other Ambulatory Visit: Payer: Self-pay | Admitting: Specialist

## 2017-06-17 DIAGNOSIS — Z1231 Encounter for screening mammogram for malignant neoplasm of breast: Secondary | ICD-10-CM

## 2017-07-15 ENCOUNTER — Ambulatory Visit
Admission: RE | Admit: 2017-07-15 | Discharge: 2017-07-15 | Disposition: A | Discharge status: Home / Self Care | Payer: Medicare HMO | Source: Ambulatory Visit | Attending: Specialist | Admitting: Specialist

## 2017-07-15 DIAGNOSIS — Z1231 Encounter for screening mammogram for malignant neoplasm of breast: Secondary | ICD-10-CM

## 2017-08-08 ENCOUNTER — Encounter: Payer: Self-pay | Admitting: Gastroenterology

## 2017-08-26 ENCOUNTER — Emergency Department: Payer: Medicare HMO

## 2017-08-26 ENCOUNTER — Inpatient Hospital Stay
Admission: EM | Admit: 2017-08-26 | Discharge: 2017-09-12 | DRG: 870 | Disposition: A | Discharge status: Skilled Nursing Facility | Payer: Medicare HMO | Attending: Internal Medicine | Admitting: Internal Medicine

## 2017-08-26 DIAGNOSIS — Y95 Nosocomial condition: Secondary | ICD-10-CM | POA: Diagnosis present

## 2017-08-26 DIAGNOSIS — G709 Myoneural disorder, unspecified: Secondary | ICD-10-CM | POA: Diagnosis present

## 2017-08-26 DIAGNOSIS — R197 Diarrhea, unspecified: Secondary | ICD-10-CM | POA: Diagnosis present

## 2017-08-26 DIAGNOSIS — R05 Cough: Secondary | ICD-10-CM

## 2017-08-26 DIAGNOSIS — G9340 Encephalopathy, unspecified: Secondary | ICD-10-CM | POA: Diagnosis not present

## 2017-08-26 DIAGNOSIS — F329 Major depressive disorder, single episode, unspecified: Secondary | ICD-10-CM | POA: Diagnosis present

## 2017-08-26 DIAGNOSIS — Z885 Allergy status to narcotic agent status: Secondary | ICD-10-CM

## 2017-08-26 DIAGNOSIS — G9341 Metabolic encephalopathy: Secondary | ICD-10-CM | POA: Diagnosis not present

## 2017-08-26 DIAGNOSIS — E870 Hyperosmolality and hypernatremia: Secondary | ICD-10-CM | POA: Diagnosis present

## 2017-08-26 DIAGNOSIS — F32A Depression, unspecified: Secondary | ICD-10-CM | POA: Diagnosis present

## 2017-08-26 DIAGNOSIS — K7689 Other specified diseases of liver: Secondary | ICD-10-CM | POA: Diagnosis present

## 2017-08-26 DIAGNOSIS — I340 Nonrheumatic mitral (valve) insufficiency: Secondary | ICD-10-CM | POA: Diagnosis not present

## 2017-08-26 DIAGNOSIS — I110 Hypertensive heart disease with heart failure: Secondary | ICD-10-CM | POA: Diagnosis present

## 2017-08-26 DIAGNOSIS — Z90710 Acquired absence of both cervix and uterus: Secondary | ICD-10-CM

## 2017-08-26 DIAGNOSIS — J9600 Acute respiratory failure, unspecified whether with hypoxia or hypercapnia: Secondary | ICD-10-CM

## 2017-08-26 DIAGNOSIS — N179 Acute kidney failure, unspecified: Secondary | ICD-10-CM | POA: Diagnosis present

## 2017-08-26 DIAGNOSIS — D6489 Other specified anemias: Secondary | ICD-10-CM | POA: Diagnosis present

## 2017-08-26 DIAGNOSIS — Z9911 Dependence on respirator [ventilator] status: Secondary | ICD-10-CM

## 2017-08-26 DIAGNOSIS — M1990 Unspecified osteoarthritis, unspecified site: Secondary | ICD-10-CM | POA: Diagnosis present

## 2017-08-26 DIAGNOSIS — G43909 Migraine, unspecified, not intractable, without status migrainosus: Secondary | ICD-10-CM | POA: Diagnosis present

## 2017-08-26 DIAGNOSIS — G92 Toxic encephalopathy: Secondary | ICD-10-CM | POA: Diagnosis present

## 2017-08-26 DIAGNOSIS — F419 Anxiety disorder, unspecified: Secondary | ICD-10-CM | POA: Diagnosis present

## 2017-08-26 DIAGNOSIS — E875 Hyperkalemia: Secondary | ICD-10-CM | POA: Diagnosis not present

## 2017-08-26 DIAGNOSIS — F339 Major depressive disorder, recurrent, unspecified: Secondary | ICD-10-CM | POA: Diagnosis present

## 2017-08-26 DIAGNOSIS — J189 Pneumonia, unspecified organism: Secondary | ICD-10-CM | POA: Diagnosis not present

## 2017-08-26 DIAGNOSIS — Z79899 Other long term (current) drug therapy: Secondary | ICD-10-CM

## 2017-08-26 DIAGNOSIS — Z87891 Personal history of nicotine dependence: Secondary | ICD-10-CM

## 2017-08-26 DIAGNOSIS — R140 Abdominal distension (gaseous): Secondary | ICD-10-CM

## 2017-08-26 DIAGNOSIS — Z91018 Allergy to other foods: Secondary | ICD-10-CM

## 2017-08-26 DIAGNOSIS — R6520 Severe sepsis without septic shock: Secondary | ICD-10-CM | POA: Diagnosis present

## 2017-08-26 DIAGNOSIS — I5023 Acute on chronic systolic (congestive) heart failure: Secondary | ICD-10-CM | POA: Diagnosis present

## 2017-08-26 DIAGNOSIS — E876 Hypokalemia: Secondary | ICD-10-CM | POA: Diagnosis present

## 2017-08-26 DIAGNOSIS — R7989 Other specified abnormal findings of blood chemistry: Secondary | ICD-10-CM | POA: Diagnosis not present

## 2017-08-26 DIAGNOSIS — Z8249 Family history of ischemic heart disease and other diseases of the circulatory system: Secondary | ICD-10-CM

## 2017-08-26 DIAGNOSIS — J9601 Acute respiratory failure with hypoxia: Secondary | ICD-10-CM | POA: Diagnosis not present

## 2017-08-26 DIAGNOSIS — A419 Sepsis, unspecified organism: Secondary | ICD-10-CM | POA: Diagnosis present

## 2017-08-26 DIAGNOSIS — R109 Unspecified abdominal pain: Secondary | ICD-10-CM

## 2017-08-26 DIAGNOSIS — I10 Essential (primary) hypertension: Secondary | ICD-10-CM | POA: Diagnosis present

## 2017-08-26 DIAGNOSIS — J80 Acute respiratory distress syndrome: Secondary | ICD-10-CM | POA: Diagnosis present

## 2017-08-26 DIAGNOSIS — R059 Cough, unspecified: Secondary | ICD-10-CM

## 2017-08-26 DIAGNOSIS — J810 Acute pulmonary edema: Secondary | ICD-10-CM | POA: Diagnosis not present

## 2017-08-26 DIAGNOSIS — I248 Other forms of acute ischemic heart disease: Secondary | ICD-10-CM | POA: Diagnosis present

## 2017-08-26 DIAGNOSIS — R509 Fever, unspecified: Secondary | ICD-10-CM | POA: Diagnosis present

## 2017-08-26 DIAGNOSIS — E871 Hypo-osmolality and hyponatremia: Secondary | ICD-10-CM | POA: Diagnosis not present

## 2017-08-26 DIAGNOSIS — Z8349 Family history of other endocrine, nutritional and metabolic diseases: Secondary | ICD-10-CM

## 2017-08-26 DIAGNOSIS — R918 Other nonspecific abnormal finding of lung field: Secondary | ICD-10-CM | POA: Diagnosis not present

## 2017-08-26 DIAGNOSIS — I2510 Atherosclerotic heart disease of native coronary artery without angina pectoris: Secondary | ICD-10-CM | POA: Diagnosis present

## 2017-08-26 DIAGNOSIS — Z4659 Encounter for fitting and adjustment of other gastrointestinal appliance and device: Secondary | ICD-10-CM

## 2017-08-26 DIAGNOSIS — J9690 Respiratory failure, unspecified, unspecified whether with hypoxia or hypercapnia: Secondary | ICD-10-CM

## 2017-08-26 LAB — CBC WITH DIFFERENTIAL/PLATELET
BASOS ABS: 0 10*3/uL (ref 0–0.1)
Basophils Relative: 0 %
EOS PCT: 1 %
Eosinophils Absolute: 0.1 10*3/uL (ref 0–0.7)
HEMATOCRIT: 30.8 % — AB (ref 35.0–47.0)
Hemoglobin: 10.3 g/dL — ABNORMAL LOW (ref 12.0–16.0)
LYMPHS ABS: 1.1 10*3/uL (ref 1.0–3.6)
LYMPHS PCT: 7 %
MCH: 26.3 pg (ref 26.0–34.0)
MCHC: 33.3 g/dL (ref 32.0–36.0)
MCV: 79.1 fL — ABNORMAL LOW (ref 80.0–100.0)
MONO ABS: 0.7 10*3/uL (ref 0.2–0.9)
Monocytes Relative: 5 %
NEUTROS ABS: 13.3 10*3/uL — AB (ref 1.4–6.5)
Neutrophils Relative %: 87 %
Platelets: 300 10*3/uL (ref 150–440)
RBC: 3.9 MIL/uL (ref 3.80–5.20)
RDW: 14.5 % (ref 11.5–14.5)
WBC: 15.3 10*3/uL — AB (ref 3.6–11.0)

## 2017-08-26 LAB — COMPREHENSIVE METABOLIC PANEL
ALBUMIN: 3.1 g/dL — AB (ref 3.5–5.0)
ALT: 11 U/L — ABNORMAL LOW (ref 14–54)
ANION GAP: 14 (ref 5–15)
AST: 22 U/L (ref 15–41)
Alkaline Phosphatase: 121 U/L (ref 38–126)
BILIRUBIN TOTAL: 0.3 mg/dL (ref 0.3–1.2)
BUN: 10 mg/dL (ref 6–20)
CHLORIDE: 97 mmol/L — AB (ref 101–111)
CO2: 29 mmol/L (ref 22–32)
Calcium: 8.4 mg/dL — ABNORMAL LOW (ref 8.9–10.3)
Creatinine, Ser: 0.82 mg/dL (ref 0.44–1.00)
GFR calc Af Amer: >60 mL/min (ref >60)
GFR calc non Af Amer: >60 mL/min (ref >60)
GLUCOSE: 116 mg/dL — AB (ref 65–99)
Potassium: 2.6 mmol/L — CL (ref 3.5–5.1)
SODIUM: 140 mmol/L (ref 135–145)
TOTAL PROTEIN: 6.7 g/dL (ref 6.5–8.1)

## 2017-08-26 LAB — LACTIC ACID, PLASMA: Lactic Acid, Venous: 1.9 mmol/L (ref 0.5–1.9)

## 2017-08-26 LAB — C DIFFICILE QUICK SCREEN W PCR REFLEX
C DIFFICILE (CDIFF) INTERP: NOT DETECTED
C Diff antigen: NEGATIVE
C Diff toxin: NEGATIVE

## 2017-08-26 MED ORDER — POTASSIUM CHLORIDE 10 MEQ/100ML IV SOLN
10.0000 meq | INTRAVENOUS | Status: AC
Start: 2017-08-26 — End: 2017-08-27
  Administered 2017-08-26 – 2017-08-27 (×5): 10 meq via INTRAVENOUS
  Filled 2017-08-26 (×6): qty 100

## 2017-08-26 MED ORDER — GUAIFENESIN-DM 100-10 MG/5ML PO SYRP
5.0000 mL | ORAL_SOLUTION | ORAL | Status: DC | PRN
  Administered 2017-08-27 – 2017-08-28 (×3): 5 mL via ORAL
  Filled 2017-08-26 (×5): qty 5

## 2017-08-26 MED ORDER — IPRATROPIUM-ALBUTEROL 0.5-2.5 (3) MG/3ML IN SOLN: 3.0000 mL | RESPIRATORY_TRACT | Status: DC | PRN

## 2017-08-26 MED ORDER — BENZTROPINE MESYLATE 0.5 MG PO TABS
0.5000 mg | ORAL_TABLET | Freq: Two times a day (BID) | ORAL | Status: DC
  Administered 2017-08-26 – 2017-09-01 (×12): 0.5 mg via ORAL
  Filled 2017-08-26 (×14): qty 1

## 2017-08-26 MED ORDER — VANCOMYCIN HCL IN DEXTROSE 1-5 GM/200ML-% IV SOLN
1000.0000 mg | Freq: Once | INTRAVENOUS | Status: AC
  Administered 2017-08-26: 1000 mg via INTRAVENOUS
  Filled 2017-08-26: qty 200

## 2017-08-26 MED ORDER — ONDANSETRON HCL 4 MG/2ML IJ SOLN
4.0000 mg | Freq: Four times a day (QID) | INTRAMUSCULAR | Status: DC | PRN
Start: 2017-08-26 — End: 2017-08-28
  Administered 2017-08-27 – 2017-08-28 (×4): 4 mg via INTRAVENOUS
  Filled 2017-08-26 (×5): qty 2

## 2017-08-26 MED ORDER — ONDANSETRON HCL 4 MG PO TABS
4.0000 mg | ORAL_TABLET | Freq: Four times a day (QID) | ORAL | Status: DC | PRN
  Administered 2017-08-27 (×2): 4 mg via ORAL
  Filled 2017-08-26 (×2): qty 1

## 2017-08-26 MED ORDER — HYDROXYZINE HCL 25 MG PO TABS
25.0000 mg | ORAL_TABLET | Freq: Two times a day (BID) | ORAL | Status: DC
  Administered 2017-08-27 – 2017-08-30 (×7): 25 mg via ORAL
  Filled 2017-08-26 (×7): qty 1

## 2017-08-26 MED ORDER — ACETAMINOPHEN 325 MG PO TABS
650.0000 mg | ORAL_TABLET | Freq: Four times a day (QID) | ORAL | Status: DC | PRN
  Administered 2017-08-27 (×2): 650 mg via ORAL
  Filled 2017-08-26 (×2): qty 2

## 2017-08-26 MED ORDER — HYDROXYZINE HCL 50 MG PO TABS
50.0000 mg | ORAL_TABLET | Freq: Every evening | ORAL | Status: DC | PRN
  Filled 2017-08-26: qty 2

## 2017-08-26 MED ORDER — PANTOPRAZOLE SODIUM 40 MG PO TBEC
40.0000 mg | DELAYED_RELEASE_TABLET | Freq: Every day | ORAL | Status: DC
  Administered 2017-08-26 – 2017-08-28 (×3): 40 mg via ORAL
  Filled 2017-08-26 (×4): qty 1

## 2017-08-26 MED ORDER — SODIUM CHLORIDE 0.9 % IV BOLUS (SEPSIS)
1000.0000 mL | Freq: Once | INTRAVENOUS | Status: AC
  Administered 2017-08-26: 1000 mL via INTRAVENOUS

## 2017-08-26 MED ORDER — ONDANSETRON HCL 4 MG/2ML IJ SOLN
4.0000 mg | Freq: Once | INTRAMUSCULAR | Status: AC
  Administered 2017-08-26: 4 mg via INTRAVENOUS

## 2017-08-26 MED ORDER — AMLODIPINE BESYLATE 5 MG PO TABS
2.5000 mg | ORAL_TABLET | Freq: Every day | ORAL | Status: DC
  Administered 2017-08-27 – 2017-08-31 (×4): 2.5 mg via ORAL
  Filled 2017-08-26 (×5): qty 1

## 2017-08-26 MED ORDER — IBUPROFEN 800 MG PO TABS
800.0000 mg | ORAL_TABLET | Freq: Once | ORAL | Status: AC
  Administered 2017-08-26: 800 mg via ORAL
  Filled 2017-08-26: qty 1

## 2017-08-26 MED ORDER — DEXTROSE 5 % IV SOLN
1.0000 g | Freq: Three times a day (TID) | INTRAVENOUS | Status: DC
  Administered 2017-08-27 – 2017-08-30 (×11): 1 g via INTRAVENOUS
  Filled 2017-08-26 (×16): qty 1

## 2017-08-26 MED ORDER — VANCOMYCIN HCL IN DEXTROSE 1-5 GM/200ML-% IV SOLN
1000.0000 mg | Freq: Three times a day (TID) | INTRAVENOUS | Status: DC
  Administered 2017-08-27 – 2017-08-28 (×4): 1000 mg via INTRAVENOUS
  Filled 2017-08-26 (×7): qty 200

## 2017-08-26 MED ORDER — MIRTAZAPINE 15 MG PO TABS
45.0000 mg | ORAL_TABLET | Freq: Every day | ORAL | Status: DC
  Administered 2017-08-26 – 2017-08-29 (×4): 45 mg via ORAL
  Filled 2017-08-26 (×4): qty 3

## 2017-08-26 MED ORDER — CEFEPIME HCL 2 G IJ SOLR
2.0000 g | Freq: Once | INTRAMUSCULAR | Status: AC
  Administered 2017-08-26: 2 g via INTRAVENOUS
  Filled 2017-08-26: qty 2

## 2017-08-26 MED ORDER — ARIPIPRAZOLE 15 MG PO TABS
15.0000 mg | ORAL_TABLET | Freq: Every day | ORAL | Status: DC
  Administered 2017-08-26 – 2017-09-01 (×6): 15 mg via ORAL
  Filled 2017-08-26 (×8): qty 1

## 2017-08-26 MED ORDER — ENOXAPARIN SODIUM 40 MG/0.4ML ~~LOC~~ SOLN
40.0000 mg | SUBCUTANEOUS | Status: DC
  Administered 2017-08-26 – 2017-09-01 (×7): 40 mg via SUBCUTANEOUS
  Filled 2017-08-26 (×7): qty 0.4

## 2017-08-26 MED ORDER — PROMETHAZINE HCL 25 MG/ML IJ SOLN
12.5000 mg | Freq: Once | INTRAMUSCULAR | Status: AC
  Administered 2017-08-26: 12.5 mg via INTRAVENOUS
  Filled 2017-08-26: qty 1

## 2017-08-26 MED ORDER — ACETAMINOPHEN 650 MG RE SUPP
650.0000 mg | Freq: Four times a day (QID) | RECTAL | Status: DC | PRN
  Administered 2017-08-31: 650 mg via RECTAL
  Filled 2017-08-26: qty 1

## 2017-08-26 MED ORDER — ONDANSETRON HCL 4 MG/2ML IJ SOLN
INTRAMUSCULAR | Status: AC
Start: 2017-08-26 — End: 2017-08-27
  Filled 2017-08-26: qty 2

## 2017-08-26 MED ORDER — FLUOXETINE HCL 20 MG PO CAPS
20.0000 mg | ORAL_CAPSULE | Freq: Every day | ORAL | Status: DC
  Administered 2017-08-26 – 2017-09-01 (×6): 20 mg via ORAL
  Filled 2017-08-26 (×8): qty 1

## 2017-08-26 MED ORDER — KETOROLAC TROMETHAMINE 30 MG/ML IJ SOLN
INTRAMUSCULAR | Status: AC
  Filled 2017-08-26: qty 1

## 2017-08-26 MED ORDER — BENZONATATE 100 MG PO CAPS
200.0000 mg | ORAL_CAPSULE | Freq: Three times a day (TID) | ORAL | Status: DC | PRN
  Administered 2017-08-27 (×2): 200 mg via ORAL
  Filled 2017-08-26 (×2): qty 2

## 2017-08-26 MED ORDER — KETOROLAC TROMETHAMINE 30 MG/ML IJ SOLN
30.0000 mg | Freq: Once | INTRAMUSCULAR | Status: AC
  Administered 2017-08-26: 30 mg via INTRAVENOUS

## 2017-08-26 NOTE — ED Notes (Signed)
Pt reports dry heaves and diarrhea x 4 today.  Pt states cdiff exposure recently.   Pt also reports abd pain.  Pt alert.  nsr on monitor.  Family with pt.

## 2017-08-26 NOTE — ED Notes (Signed)
Report off to allison rn  

## 2017-08-26 NOTE — H&P (Signed)
[facility] NAME: Morgan [name]    MR#:  [id number]  DATE OF BIRTH:  04-28-1951  DATE OF ADMISSION:  08/26/2017  PRIMARY CARE PHYSICIAN: [name], [name], MD   REQUESTING/REFERRING PHYSICIAN: Joni Fears, MD  CHIEF COMPLAINT:   Chief Complaint  Patient presents with  . URI  . Fever    HISTORY OF PRESENT ILLNESS:  Morgan [name]  is a 66 y.o. female who presents with fever, cough, malaise and myalgias. Morgan [name] states it is been going on for the past 3 days. It is getting worse. Here in the ED Morgan [name] was found to have multifocal pneumonia and meets sepsis criteria. hospitalists were called for admission and further treatment  PAST MEDICAL HISTORY:   Past Medical History:  Diagnosis Date  . Allergy   . Anemia   . Anxiety   . Arthritis   . Depression   . Hypertension   . Neuromuscular disorder ([place])     PAST SURGICAL HISTORY:   Past Surgical History:  Procedure Laterality Date  . ABDOMINAL HYSTERECTOMY    . APPENDECTOMY    . BREAST SURGERY    . KNEE SURGERY  2005   2 times left  . TUBAL LIGATION      SOCIAL HISTORY:   Social History  Substance Use Topics  . Smoking status: Former Smoker    Types: Cigarettes  . Smokeless tobacco: Never Used  . Alcohol use No    FAMILY HISTORY:   Family History  Problem Relation Age of Onset  . Cancer Mother        breast and ovarian  . Breast cancer Mother   . Hypertension Father   . Hyperlipidemia Father   . Cancer Maternal Grandmother        breast  . Cancer Paternal Grandmother   . Cancer Paternal Grandfather   . Cancer Maternal Aunt        breast  . Breast cancer Maternal Aunt   . Cancer Maternal Aunt        breast  . Breast cancer Maternal Aunt     DRUG ALLERGIES:   Allergies  Allergen Reactions  . Demerol [Meperidine]     Hives   . Strawberry (Diagnostic) Hives    MEDICATIONS AT HOME:   Prior to Admission medications   Medication Sig Start Date  End Date Taking? Authorizing Provider  acetaminophen (TYLENOL) 500 MG tablet Take 1,000 mg by mouth every 6 (six) hours as needed for moderate pain (pain).    Yes [provider]  albuterol (PROVENTIL HFA;VENTOLIN HFA) 108 (90 BASE) MCG/ACT inhaler Inhale 2 puffs into the lungs every 4 (four) hours as needed for wheezing or shortness of breath. 02/24/15  Yes Ghimire, Henreitta Leber, MD  amLODipine (NORVASC) 2.5 MG tablet Take 2.5 mg by mouth daily.   Yes [provider]  ARIPiprazole (ABILIFY) 15 MG tablet Take 15 mg by mouth daily.    Yes [provider]  azithromycin (ZITHROMAX) 250 MG tablet Take 1 tablet (250 mg total) by mouth daily. 1 tablet daily starting 05/16/2016 05/15/16  Yes Orlie Dakin, MD  benztropine (COGENTIN) 0.5 MG tablet Take 0.5 mg by mouth 2 (two) times daily.   Yes [provider]  FLUoxetine (PROZAC) 20 MG capsule Take 20 mg by mouth daily.   Yes [provider]  gabapentin (NEURONTIN) 400 MG capsule Take 400 mg by mouth 3 (three) times daily as needed.    Yes [provider]  hydrOXYzine (VISTARIL) 25 MG capsule Take 25-50 mg by mouth 3 (three) times daily as needed. Take 25 mg by mouth twice daily and take 50 mg by mouth at bedtime as needed   Yes [provider]  mirtazapine (REMERON) 45 MG tablet Take 45 mg by mouth at bedtime.    Yes [provider]  pantoprazole (PROTONIX) 40 MG tablet Take 40 mg by mouth daily.   Yes [provider]  potassium chloride (K-DUR) 10 MEQ tablet Take 10 mEq by mouth daily.   Yes [provider]    REVIEW OF SYSTEMS:  Review of Systems  Constitutional: Positive for chills, fever and malaise/fatigue. Negative for weight loss.  HENT: Negative for ear pain, hearing loss and tinnitus.   Eyes: Negative for blurred vision, double vision, pain and redness.  Respiratory: Positive for cough. Negative for hemoptysis and shortness of breath.   Cardiovascular:  Negative for chest pain, palpitations, orthopnea and leg swelling.  Gastrointestinal: Positive for nausea. Negative for abdominal pain, constipation, diarrhea and vomiting.  Genitourinary: Negative for dysuria, frequency and hematuria.  Musculoskeletal: Negative for back pain, joint pain and neck pain.  Skin:       No acne, rash, or lesions  Neurological: Negative for dizziness, tremors, focal weakness and weakness.  Endo/Heme/Allergies: Negative for polydipsia. Does not bruise/bleed easily.  Psychiatric/Behavioral: Negative for depression. The patient is not nervous/anxious and does not have insomnia.      VITAL SIGNS:   Vitals:   08/26/17 1930 08/26/17 2000 08/26/17 2030 08/26/17 2042  BP: (!) 159/82 (!) 162/78 (!) 158/84   Pulse: 69 65 62   Resp: 17 18 20    Temp:    98.5 F (36.9 C)  TempSrc:    Oral  SpO2: 92% 95% 94%   Weight:      Height:       Wt Readings from Last 3 Encounters:  08/26/17 94.8 kg (209 lb)  05/15/16 91.5 kg (201 lb 12.8 oz)  11/30/15 88.9 kg (196 lb)    PHYSICAL EXAMINATION:  Physical Exam  Vitals reviewed. Constitutional: Morgan [name] is oriented to person, place, and time. Morgan [name] appears well-developed and well-nourished. No distress.  HENT:  Head: Normocephalic and atraumatic.  Mouth/Throat: Oropharynx is clear and moist.  Eyes: Pupils are equal, round, and reactive to light. Conjunctivae and EOM are normal. No scleral icterus.  Neck: Normal range of motion. Neck supple. No JVD present. No thyromegaly present.  Cardiovascular: Normal rate, regular rhythm and intact distal pulses.  Exam reveals no gallop and no friction rub.   No murmur heard. Respiratory: Effort normal. No respiratory distress. Morgan [name] has no wheezes. Morgan [name] has no rales.  Bilateral patchy coarse breath sounds  GI: Soft. Bowel sounds are normal. Morgan [name] exhibits no distension. There is no tenderness.  Musculoskeletal: Normal range of motion. Morgan [name] exhibits no edema.  No arthritis, no gout   Lymphadenopathy:    Morgan [name] has no cervical adenopathy.  Neurological: Morgan [name] is alert and oriented to person, place, and time. No cranial nerve deficit.  No dysarthria, no aphasia  Skin: Skin is warm and dry. No rash noted. No erythema.  Psychiatric: Morgan [name] has a normal mood and affect. Her behavior is normal. Judgment and thought content normal.    LABORATORY PANEL:   CBC  Recent Labs Lab 08/26/17 1700  WBC 15.3*  HGB 10.3*  HCT 30.8*  PLT 300   ------------------------------------------------------------------------------------------------------------------  Chemistries   Recent Labs Lab 08/26/17 1700  NA 140  K 2.6*  CL 97*  CO2 29  GLUCOSE 116*  BUN 10  CREATININE 0.82  CALCIUM 8.4*  AST 22  ALT 11*  ALKPHOS 121  BILITOT 0.3   ------------------------------------------------------------------------------------------------------------------  Cardiac Enzymes No results for input(s): TROPONINI in the last 168 hours. ------------------------------------------------------------------------------------------------------------------  RADIOLOGY:  Dg Chest 2 View  Result Date: 08/26/2017 CLINICAL DATA:  Cough x3 weeks with vomiting and fever today. Mid diarrhea x3 months. History of mitral valve prolapse. Former smoker. EXAM: CHEST  2 VIEW COMPARISON:  09/18/2012 FINDINGS: Heart is normal in size. No aortic aneurysm. Patchy bilateral upper, right middle lobe and lingular airspace opacities are noted superimposed on chronic interstitial pulmonary change. Findings are consistent with multilobar pneumonia. No effusion or pneumothorax. Biapical pleuroparenchymal thickening and scarring is stable. No acute nor suspicious osseous lesions. IMPRESSION: Bilateral upper lobe, right middle lobe and lingular airspace opacities consistent with multilobar pneumonia. Followup PA and lateral chest X-ray is recommended in 3-4 weeks following trial of antibiotic therapy to ensure resolution and  exclude underlying malignancy. Electronically Signed   By: Ashley Royalty M.D.   On: 08/26/2017 18:50    EKG:   Orders placed or performed during the hospital encounter of 02/22/15  . EKG 12-Lead  . EKG 12-Lead    IMPRESSION AND PLAN:  Principal Problem:   Sepsis ([place]) - IV antibiotics, hemodynamically stable, lactic acid pending, cultures sent from ED Active Problems:   HCAP (healthcare-associated pneumonia) - IV antibiotics and cultures as above, supportive treatment PRN   Essential hypertension - continue home meds   Depression - home medications   Anxiety - home dose anxiolytics  All the records are reviewed and case discussed with ED provider. Management plans discussed with the patient and/or family.  DVT PROPHYLAXIS: SubQ lovenox  GI PROPHYLAXIS: PPI  ADMISSION STATUS: Inpatient  CODE STATUS: Full Code Status History    Date Active Date Inactive Code Status Order ID Comments User Context   02/23/2015 12:58 AM 02/24/2015 12:53 PM Full Code [id number]  Ivor Costa, MD Inpatient      TOTAL TIME TAKING CARE OF THIS PATIENT: 45 minutes.   Gabrella Stroh Reyno 08/26/2017, 8:47 PM  [facility]  Office  [phone]  CC: Primary care physician; Javier Docker, MD  Note:  This document was prepared using Dragon voice recognition software and may include unintentional dictation errors.

## 2017-08-26 NOTE — ED Notes (Signed)
Patient transported to X-ray 

## 2017-08-26 NOTE — ED Triage Notes (Signed)
Pt c/o cough with congestion for the past 3 weeks, was recently seen at PCP and Rx zpack with no relief. Today running a fever.Marland Kitchen

## 2017-08-26 NOTE — ED Provider Notes (Signed)
[facility] Emergency Department Provider Note  ____________________________________________  Time seen: Approximately 7:32 PM  I have reviewed the triage vital signs and the nursing notes.   HISTORY  Chief Complaint URI and Fever    HPI Morgan [name] is a 66 y.o. female who reports 3 weeks of worsening shortness of breath and nonproductive cough. She has seen her doctor on 2 occasions, first was given an antibiotic which did not help. She saw them again and they gave her different antibiotic. Now today she has a fever to 101. She continues to have worsening shortness of breath, and cannot lay flat because she feels like she can't breathe in that position. She is more comfortable sitting upright. Denies any syncope. Denies any underlying lung disease. Shortness of breath is moderate in severity. Also has aching generalized chest pain with it.     Past Medical History:  Diagnosis Date  . Allergy   . Anemia   . Anxiety   . Arthritis   . Depression   . Hypertension   . Neuromuscular disorder [facility])      Patient Active Problem List   Diagnosis Date Noted  . Essential hypertension   . Depression   . CAP (community acquired pneumonia) 02/22/2015  . Cough 02/22/2015  . Diarrhea 02/22/2015  . Hypokalemia 02/22/2015  . Tobacco abuse 02/22/2015  . Hypertension   . Mass of breast, right 11/18/2013  . Chronic lower back pain 04/29/2013  . Paresthesia 04/29/2013     Past Surgical History:  Procedure Laterality Date  . ABDOMINAL HYSTERECTOMY    . APPENDECTOMY    . BREAST SURGERY    . KNEE SURGERY  2005   2 times left  . TUBAL LIGATION       Prior to Admission medications   Medication Sig Start Date End Date Taking? Authorizing Provider  acetaminophen (TYLENOL) 500 MG tablet Take 1,000 mg by mouth every 6 (six) hours as needed for moderate pain (pain).    Yes [provider]  albuterol (PROVENTIL HFA;VENTOLIN HFA) 108 (90 BASE) MCG/ACT  inhaler Inhale 2 puffs into the lungs every 4 (four) hours as needed for wheezing or shortness of breath. 02/24/15  Yes Ghimire, Henreitta Leber, MD  amLODipine (NORVASC) 2.5 MG tablet Take 2.5 mg by mouth daily.   Yes [provider]  ARIPiprazole (ABILIFY) 15 MG tablet Take 15 mg by mouth daily.    Yes [provider]  azithromycin (ZITHROMAX) 250 MG tablet Take 1 tablet (250 mg total) by mouth daily. 1 tablet daily starting 05/16/2016 05/15/16  Yes Orlie Dakin, MD  benztropine (COGENTIN) 0.5 MG tablet Take 0.5 mg by mouth 2 (two) times daily.   Yes [provider]  FLUoxetine (PROZAC) 20 MG capsule Take 20 mg by mouth daily.   Yes [provider]  gabapentin (NEURONTIN) 400 MG capsule Take 400 mg by mouth 3 (three) times daily as needed.    Yes [provider]  hydrOXYzine (VISTARIL) 25 MG capsule Take 25-50 mg by mouth 3 (three) times daily as needed. Take 25 mg by mouth twice daily and take 50 mg by mouth at bedtime as needed   Yes [provider]  mirtazapine (REMERON) 45 MG tablet Take 45 mg by mouth at bedtime.    Yes [provider]  pantoprazole (PROTONIX) 40 MG tablet Take 40 mg by mouth daily.   Yes [provider]  potassium chloride (K-DUR) 10 MEQ tablet Take 10 mEq by mouth daily.  Yes [provider]  Diclofenac Sodium 3 % GEL Apply to affected area up to 4 times daily Patient not taking: Reported on 12/11/2014 05/26/14   Waldemar Dickens, MD  doxycycline (VIBRAMYCIN) 100 MG capsule Take 1 capsule (100 mg total) by mouth 2 (two) times daily. One po bid x 7 days Patient not taking: Reported on 05/15/2016 09/02/15   Julianne Rice, MD  guaiFENesin (MUCINEX) 600 MG 12 hr tablet Take 1 tablet (600 mg total) by mouth 2 (two) times daily. Patient not taking: Reported on 05/15/2016 02/24/15   Jonetta Osgood, MD  ipratropium (ATROVENT) 0.06 % nasal spray Place 2 sprays into both nostrils 4 (four) times  daily. Patient not taking: Reported on 05/15/2016 06/13/15   Billy Fischer, MD  levofloxacin (LEVAQUIN) 500 MG tablet Take 1 tablet (500 mg total) by mouth daily. Patient not taking: Reported on 05/15/2016 02/24/15   Jonetta Osgood, MD  meloxicam (MOBIC) 15 MG tablet Take 1 tablet (15 mg total) by mouth daily. Take 1 daily with food. Patient not taking: Reported on 05/15/2016 11/30/15   Margarita Mail, PA-C  nicotine (NICODERM CQ - DOSED IN MG/24 HOURS) 21 mg/24hr patch Place 1 patch (21 mg total) onto the skin daily. Patient not taking: Reported on 05/15/2016 02/24/15   Jonetta Osgood, MD  nitrofurantoin, macrocrystal-monohydrate, (MACROBID) 100 MG capsule Take 1 capsule (100 mg total) by mouth 2 (two) times daily. Patient not taking: Reported on 08/26/2017 05/25/17   Barry Dienes, NP  ondansetron (ZOFRAN ODT) 4 MG disintegrating tablet 4mg  ODT q4 hours prn nausea/vomit Patient not taking: Reported on 05/15/2016 09/02/15   Julianne Rice, MD  oseltamivir (TAMIFLU) 75 MG capsule Take 1 capsule (75 mg total) by mouth 2 (two) times daily. Patient not taking: Reported on 05/15/2016 02/24/15   Jonetta Osgood, MD  predniSONE (DELTASONE) 10 MG tablet Take 4 tablets (40 mg) daily for 1 days, then, Take 3 tablets (30 mg) daily for 1 days, then, Take 2 tablets (20 mg) daily for 1 day, then, Take 1 tablets (10 mg) daily for 1 days, then stop Patient not taking: Reported on 05/15/2016 02/24/15   Jonetta Osgood, MD     Allergies Demerol [meperidine] and Strawberry (diagnostic)   Family History  Problem Relation Age of Onset  . Cancer Mother        breast and ovarian  . Breast cancer Mother   . Hypertension Father   . Hyperlipidemia Father   . Cancer Maternal Grandmother        breast  . Cancer Paternal Grandmother   . Cancer Paternal Grandfather   . Cancer Maternal Aunt        breast  . Breast cancer Maternal Aunt   . Cancer Maternal Aunt        breast  . Breast cancer Maternal Aunt      Social History Social History  Substance Use Topics  . Smoking status: Former Smoker    Types: Cigarettes  . Smokeless tobacco: Never Used  . Alcohol use No    Review of Systems  Constitutional:   positive fever and chills  ENT:   No sore throat. No rhinorrhea. Cardiovascular:  positive chest pain as above without syncope. Respiratory:   positive shortness of breath and nonproductive cough. Gastrointestinal:   Negative for abdominal painor vomiting. Positive for diarrhea..  Musculoskeletal:   Negative for focal pain or swelling All other systems reviewed and are negative except as documented above in ROS  and HPI.  ____________________________________________   PHYSICAL EXAM:  VITAL SIGNS: ED Triage Vitals  Enc Vitals Group     BP 08/26/17 1658 (!) 171/91     Pulse Rate 08/26/17 1658 80     Resp 08/26/17 1658 18     Temp 08/26/17 1658 (!) 101.6 F (38.7 C)     Temp Source 08/26/17 1658 Oral     SpO2 08/26/17 1658 96 %     Weight 08/26/17 1659 209 lb (94.8 kg)     Height 08/26/17 1659 6\' 1"  (1.854 m)     Head Circumference --      Peak Flow --      Pain Score 08/26/17 1657 7     Pain Loc --      Pain Edu? --      Excl. in [place]? --     Vital signs reviewed, nursing assessments reviewed.   Constitutional:   Alert and oriented. not in distress Eyes:   No scleral icterus.  EOMI. No nystagmus. No conjunctival pallor. PERRL. ENT   Head:   Normocephalic and atraumatic.   Nose:   No congestion/rhinnorhea.    Mouth/Throat:   MMM, no pharyngeal erythema. No peritonsillar mass.    Neck:   No meningismus. Full ROM Hematological/Lymphatic/Immunilogical:   No cervical lymphadenopathy. Cardiovascular:   RRR. Symmetric bilateral radial and DP pulses.  No murmurs.  Respiratory:   Normal respiratory effort without tachypnea/retractions.normal expiratory phase, diffuse expiratory wheezing. Gastrointestinal:   Soft and nontender. Non distended. There is no CVA  tenderness.  No rebound, rigidity, or guarding. Genitourinary:   deferred Musculoskeletal:   Normal range of motion in all extremities. No joint effusions.  No lower extremity tenderness.  No edema. Neurologic:   Normal speech and language.  Motor grossly intact. No gross focal neurologic deficits are appreciated.  Skin:    Skin is warm, dry and intact. No rash noted.  No petechiae, purpura, or bullae.  ____________________________________________    LABS (pertinent positives/negatives) (all labs ordered are listed, but only abnormal results are displayed) Labs Reviewed  COMPREHENSIVE METABOLIC PANEL - Abnormal; Notable for the following:       Result Value   Potassium 2.6 (*)    Chloride 97 (*)    Glucose, Bld 116 (*)    Calcium 8.4 (*)    Albumin 3.1 (*)    ALT 11 (*)    All other components within normal limits  CBC WITH DIFFERENTIAL/PLATELET - Abnormal; Notable for the following:    WBC 15.3 (*)    Hemoglobin 10.3 (*)    HCT 30.8 (*)    MCV 79.1 (*)    Neutro Abs 13.3 (*)    All other components within normal limits  C DIFFICILE QUICK SCREEN W PCR REFLEX   ____________________________________________   EKG    ____________________________________________    RADIOLOGY  Dg Chest 2 View  Result Date: 08/26/2017 CLINICAL DATA:  Cough x3 weeks with vomiting and fever today. Mid diarrhea x3 months. History of mitral valve prolapse. Former smoker. EXAM: CHEST  2 VIEW COMPARISON:  09/18/2012 FINDINGS: Heart is normal in size. No aortic aneurysm. Patchy bilateral upper, right middle lobe and lingular airspace opacities are noted superimposed on chronic interstitial pulmonary change. Findings are consistent with multilobar pneumonia. No effusion or pneumothorax. Biapical pleuroparenchymal thickening and scarring is stable. No acute nor suspicious osseous lesions. IMPRESSION: Bilateral upper lobe, right middle lobe and lingular airspace opacities consistent with multilobar  pneumonia. Followup PA and lateral  chest X-ray is recommended in 3-4 weeks following trial of antibiotic therapy to ensure resolution and exclude underlying malignancy. Electronically Signed   By: Ashley Royalty M.D.   On: 08/26/2017 18:50    ____________________________________________   PROCEDURES Procedures  ____________________________________________   INITIAL IMPRESSION / ASSESSMENT AND PLAN / ED COURSE  Pertinent labs & imaging results that were available during my care of the patient were reviewed by me and considered in my medical decision making (see chart for details).  patient presents with persistent shortness of breath and nonproductive cough and fever. Chest x-ray reveals multifocal pneumonia involving middle and upper lobes bilaterally. Patient is newly developed a fever and now has a leukocytosis of 15,000. Has failed outpatient treatment twice. At this point with the failure to improve and bilateral involvement, but be worried that she would suffer significant respiratory compromise at this continues to evolve, so we'll place her on cefepime and vancomycin and admit her to the hospital.  patient also reports persistent diarrhea. With her recent anabolic's we will screen for C. difficile as well.      ____________________________________________   FINAL CLINICAL IMPRESSION(S) / ED DIAGNOSES  Final diagnoses:  HCAP (healthcare-associated pneumonia)  Multifocal pneumonia      New Prescriptions   No medications on file     Portions of this note were generated with dragon dictation software. Dictation errors may occur despite best attempts at proofreading.    Carrie Mew, MD 08/26/17 [phone]

## 2017-08-26 NOTE — ED Notes (Signed)
Pt transported to floor by Willow Ora, EDT following this RN collect blood and start second IV start.

## 2017-08-26 NOTE — ED Notes (Signed)
EDP Stafford consulted prior to antibiotic administration to verify need for blood culturesx2 and lactic. EDP stated no need. MD Jannifer Franklin ordered blood cultures x2 and lactic acid at admission. Pt had already received 2 gram cefepime, 1g vancomycin prior to collect of blood cultures and lactic.

## 2017-08-26 NOTE — ED Notes (Signed)
Admitting MD at bedside.

## 2017-08-26 NOTE — Progress Notes (Signed)
Pharmacy Antibiotic Note  Morgan [name] is a 66 y.o. female admitted on 08/26/2017 with pneumonia.  Pharmacy has been consulted for vanc/cefepime dosing.  Plan: Patient received vanc 1g and cefepime 2g IV x 1 in ED  Will f/u w/ vanc 1g IV q8h w/ 8 hour stack dose. Will draw vanc trough 9/11 @ 1700 prior to 4th dose. Will start cefepime 1g IV q8h.  Ke 0.0799 T1/2 8 hrs Goal trough 15 - 20 mcg/mL  Height: 6\' 1"  (185.4 cm) Weight: 209 lb (94.8 kg) IBW/kg (Calculated) : 75.4  Temp (24hrs), Avg:99.4 F (37.4 C), Min:98.1 F (36.7 C), Max:101.6 F (38.7 C)   Recent Labs Lab 08/26/17 1700  WBC 15.3*  CREATININE 0.82    Estimated Creatinine Clearance: 91 mL/min (by C-G formula based on SCr of 0.82 mg/dL).    Allergies  Allergen Reactions  . Demerol [Meperidine]     Hives   . Strawberry (Diagnostic) Hives    Thank you for allowing pharmacy to be a part of this patient's care.  Tobie Lords, PharmD, BCPS Clinical Pharmacist 08/26/2017

## 2017-08-26 NOTE — Progress Notes (Signed)
Pt. Takes ,remeron 45mg  hs at home. Dr. Jannifer Franklin gave verbal order for medicine to be given.

## 2017-08-26 NOTE — ED Notes (Signed)
MD Jannifer Franklin verbalized order for 12.5 mg iv phenergan d/t pt nausea/dry heaves/diarrhea return. Stool specimen sent to lab

## 2017-08-27 ENCOUNTER — Inpatient Hospital Stay: Payer: Medicare HMO

## 2017-08-27 LAB — BASIC METABOLIC PANEL
Anion gap: 7 (ref 5–15)
BUN: 10 mg/dL (ref 6–20)
CO2: 31 mmol/L (ref 22–32)
CREATININE: 0.79 mg/dL (ref 0.44–1.00)
Calcium: 7.7 mg/dL — ABNORMAL LOW (ref 8.9–10.3)
Chloride: 102 mmol/L (ref 101–111)
GFR calc Af Amer: >60 mL/min (ref >60)
GFR calc non Af Amer: >60 mL/min (ref >60)
Glucose, Bld: 103 mg/dL — ABNORMAL HIGH (ref 65–99)
Potassium: 2.9 mmol/L — ABNORMAL LOW (ref 3.5–5.1)
SODIUM: 140 mmol/L (ref 135–145)

## 2017-08-27 LAB — CBC
HEMATOCRIT: 27.7 % — AB (ref 35.0–47.0)
Hemoglobin: 9.2 g/dL — ABNORMAL LOW (ref 12.0–16.0)
MCH: 26.4 pg (ref 26.0–34.0)
MCHC: 33.2 g/dL (ref 32.0–36.0)
MCV: 79.7 fL — AB (ref 80.0–100.0)
PLATELETS: 278 10*3/uL (ref 150–440)
RBC: 3.48 MIL/uL — AB (ref 3.80–5.20)
RDW: 14.1 % (ref 11.5–14.5)
WBC: 15.7 10*3/uL — ABNORMAL HIGH (ref 3.6–11.0)

## 2017-08-27 LAB — VANCOMYCIN, TROUGH: Vancomycin Tr: 18 ug/mL (ref 15–20)

## 2017-08-27 MED ORDER — IPRATROPIUM-ALBUTEROL 0.5-2.5 (3) MG/3ML IN SOLN
3.0000 mL | RESPIRATORY_TRACT | Status: DC
  Administered 2017-08-27 – 2017-08-30 (×16): 3 mL via RESPIRATORY_TRACT
  Filled 2017-08-27 (×15): qty 3

## 2017-08-27 MED ORDER — FUROSEMIDE 10 MG/ML IJ SOLN
20.0000 mg | Freq: Once | INTRAMUSCULAR | Status: AC
  Administered 2017-08-27: 20 mg via INTRAVENOUS
  Filled 2017-08-27: qty 4

## 2017-08-27 MED ORDER — ASPIRIN-ACETAMINOPHEN-CAFFEINE 250-250-65 MG PO TABS
1.0000 | ORAL_TABLET | Freq: Four times a day (QID) | ORAL | Status: DC | PRN
  Administered 2017-08-27: 1 via ORAL
  Filled 2017-08-27 (×3): qty 1

## 2017-08-27 MED ORDER — POTASSIUM CHLORIDE CRYS ER 20 MEQ PO TBCR
40.0000 meq | EXTENDED_RELEASE_TABLET | ORAL | Status: AC
  Administered 2017-08-27 (×2): 40 meq via ORAL
  Filled 2017-08-27 (×2): qty 2

## 2017-08-27 MED ORDER — POTASSIUM CHLORIDE CRYS ER 20 MEQ PO TBCR: 40.0000 meq | EXTENDED_RELEASE_TABLET | ORAL | Status: DC | PRN

## 2017-08-27 NOTE — Progress Notes (Signed)
Pharmacy Antibiotic Note  Morgan [name] is a 66 y.o. female admitted on 08/26/2017 with pneumonia.  Pharmacy has been consulted for vanc/cefepime dosing.  Plan: Vanc trough 18. Continue  Vancomycin 1g IV q8h. Monitor renal function and recheck trough as needed.    Continue cefepime 1g IV q8h.  Ke 0.0799 T1/2 8 hrs Goal trough 15 - 20 mcg/mL  Height: 6\' 1"  (185.4 cm) Weight: 209 lb (94.8 kg) IBW/kg (Calculated) : 75.4  Temp (24hrs), Avg:98.3 F (36.8 C), Min:98.1 F (36.7 C), Max:98.5 F (36.9 C)   Recent Labs Lab 08/26/17 1700 08/26/17 2148 08/27/17 0405 08/27/17 1903  WBC 15.3*  --  15.7*  --   CREATININE 0.82  --  0.79  --   LATICACIDVEN  --  1.9  --   --   VANCOTROUGH  --   --   --  18    Estimated Creatinine Clearance: 93.3 mL/min (by C-G formula based on SCr of 0.79 mg/dL).    Allergies  Allergen Reactions  . Demerol [Meperidine]     Hives   . Strawberry (Diagnostic) Hives    Thank you for allowing pharmacy to be a part of this patient's care.  Pernell Dupre, PharmD, BCPS Clinical Pharmacist 08/27/2017 7:37 PM

## 2017-08-27 NOTE — Progress Notes (Signed)
[place] [facility] [place] NAME: Morgan [name]    MR#:  [id number]  DATE OF BIRTH:  06/23/1951  SUBJECTIVE:  CHIEF COMPLAINT:  Patient is feeling sick reporting cough and migraine headache. Patient usually takes Excedrin Migraine at home which helps with her migraine headache  REVIEW OF SYSTEMS:  CONSTITUTIONAL: No fever, fatigue or weakness.  EYES: No blurred or double vision.  EARS, NOSE, AND THROAT: No tinnitus or ear pain. Reporting migraine headache RESPIRATORY: Reporting cough, denies shortness of breath, wheezing or hemoptysis.  CARDIOVASCULAR: No chest pain, orthopnea, edema.  GASTROINTESTINAL: No nausea, vomiting, diarrhea or abdominal pain.  GENITOURINARY: No dysuria, hematuria.  ENDOCRINE: No polyuria, nocturia,  HEMATOLOGY: No anemia, easy bruising or bleeding SKIN: No rash or lesion. MUSCULOSKELETAL: No joint pain or arthritis.   NEUROLOGIC: No tingling, numbness, weakness.  PSYCHIATRY: No anxiety or depression.   DRUG ALLERGIES:   Allergies  Allergen Reactions  . Demerol [Meperidine]     Hives   . Strawberry (Diagnostic) Hives    VITALS:  Blood pressure (!) 152/66, pulse 71, temperature 98.3 F (36.8 C), temperature source Oral, resp. rate 12, height 6\' 1"  (1.854 m), weight 94.8 kg (209 lb), SpO2 91 %.  PHYSICAL EXAMINATION:  GENERAL:  66 y.o.-year-old patient lying in the bed with no acute distress.  EYES: Pupils equal, round, reactive to light and accommodation. No scleral icterus. Extraocular muscles intact.  HEENT: Head atraumatic, normocephalic. Oropharynx and nasopharynx clear.  NECK:  Supple, no jugular venous distention. No thyroid enlargement, no tenderness.  LUNGS: Moderate breath sounds bilaterally, positive crackles no wheezing, rales,rhonchi  No use of accessory muscles of respiration.  CARDIOVASCULAR: S1, S2 normal. No murmurs, rubs, or gallops.  ABDOMEN: Soft, nontender, nondistended. Bowel sounds  present. No organomegaly or mass.  EXTREMITIES: No pedal edema, cyanosis, or clubbing.  NEUROLOGIC: Cranial nerves II through XII are intact. Muscle strength 5/5 in all extremities. Sensation intact. Gait not checked.  PSYCHIATRIC: The patient is alert and oriented x 3.  SKIN: No obvious rash, lesion, or ulcer.    LABORATORY PANEL:   CBC  Recent Labs Lab 08/27/17 0405  WBC 15.7*  HGB 9.2*  HCT 27.7*  PLT 278   ------------------------------------------------------------------------------------------------------------------  Chemistries   Recent Labs Lab 08/26/17 1700 08/27/17 0405  NA 140 140  K 2.6* 2.9*  CL 97* 102  CO2 29 31  GLUCOSE 116* 103*  BUN 10 10  CREATININE 0.82 0.79  CALCIUM 8.4* 7.7*  AST 22  --   ALT 11*  --   ALKPHOS 121  --   BILITOT 0.3  --    ------------------------------------------------------------------------------------------------------------------  Cardiac Enzymes No results for input(s): TROPONINI in the last 168 hours. ------------------------------------------------------------------------------------------------------------------  RADIOLOGY:  Dg Chest 2 View  Result Date: 08/26/2017 CLINICAL DATA:  Cough x3 weeks with vomiting and fever today. Mid diarrhea x3 months. History of mitral valve prolapse. Former smoker. EXAM: CHEST  2 VIEW COMPARISON:  09/18/2012 FINDINGS: Heart is normal in size. No aortic aneurysm. Patchy bilateral upper, right middle lobe and lingular airspace opacities are noted superimposed on chronic interstitial pulmonary change. Findings are consistent with multilobar pneumonia. No effusion or pneumothorax. Biapical pleuroparenchymal thickening and scarring is stable. No acute nor suspicious osseous lesions. IMPRESSION: Bilateral upper lobe, right middle lobe and lingular airspace opacities consistent with multilobar pneumonia. Followup PA and lateral chest X-ray is recommended in 3-4 weeks following trial of  antibiotic therapy to ensure resolution and exclude underlying malignancy.  Electronically Signed   By: Ashley Royalty M.D.   On: 08/26/2017 18:50    EKG:   Orders placed or performed during the hospital encounter of 02/22/15  . EKG 12-Lead  . EKG 12-Lead    ASSESSMENT AND PLAN:   Sepsis ([place]) -Secondary to healthcare associated pneumonia  IV antibiotics cefepime   hemodynamically stable Lactic acid normal at 1.9 Follow-up on the cultures are pending    #hypokalemia replete and recheck   #Migraine headache Excedrin Migraine as needed   #  Essential hypertension - continue home med amlodipine     #depression and anxiety continue home medication Prozac      All the records are reviewed and case discussed with Care Management/Social Workerr. Management plans discussed with the patient, family and they are in agreement.  CODE STATUS: fc   TOTAL TIME TAKING CARE OF THIS PATIENT: 35  minutes.   POSSIBLE D/C IN 2  DAYS, DEPENDING ON CLINICAL CONDITION.  Note: This dictation was prepared with Dragon dictation along with smaller phrase technology. Any transcriptional errors that result from this process are unintentional.   Nicholes Mango M.D on 08/27/2017 at 4:03 PM  Between 7am to 6pm - Pager - [phone] After 6pm go to [email or web address] - password [email or web address] [facility]  Office  [phone]  CC: Primary care physician; Javier Docker, MD

## 2017-08-28 ENCOUNTER — Inpatient Hospital Stay: Payer: Medicare HMO

## 2017-08-28 DIAGNOSIS — R918 Other nonspecific abnormal finding of lung field: Secondary | ICD-10-CM

## 2017-08-28 DIAGNOSIS — J9601 Acute respiratory failure with hypoxia: Secondary | ICD-10-CM

## 2017-08-28 DIAGNOSIS — R7989 Other specified abnormal findings of blood chemistry: Secondary | ICD-10-CM

## 2017-08-28 LAB — HIV ANTIBODY (ROUTINE TESTING W REFLEX): HIV Screen 4th Generation wRfx: NONREACTIVE

## 2017-08-28 LAB — GLUCOSE, CAPILLARY
GLUCOSE-CAPILLARY: 169 mg/dL — AB (ref 65–99)
GLUCOSE-CAPILLARY: 195 mg/dL — AB (ref 65–99)
Glucose-Capillary: 134 mg/dL — ABNORMAL HIGH (ref 65–99)
Glucose-Capillary: 154 mg/dL — ABNORMAL HIGH (ref 65–99)

## 2017-08-28 LAB — BRAIN NATRIURETIC PEPTIDE: B Natriuretic Peptide: 2321 pg/mL — ABNORMAL HIGH (ref 0.0–100.0)

## 2017-08-28 LAB — BASIC METABOLIC PANEL
Anion gap: 10 (ref 5–15)
BUN: 10 mg/dL (ref 6–20)
CALCIUM: 8.3 mg/dL — AB (ref 8.9–10.3)
CO2: 31 mmol/L (ref 22–32)
CREATININE: 0.53 mg/dL (ref 0.44–1.00)
Chloride: 96 mmol/L — ABNORMAL LOW (ref 101–111)
Glucose, Bld: 144 mg/dL — ABNORMAL HIGH (ref 65–99)
Potassium: 3.2 mmol/L — ABNORMAL LOW (ref 3.5–5.1)
Sodium: 137 mmol/L (ref 135–145)

## 2017-08-28 LAB — CBC
HEMATOCRIT: 29.9 % — AB (ref 35.0–47.0)
Hemoglobin: 10.4 g/dL — ABNORMAL LOW (ref 12.0–16.0)
MCH: 27.7 pg (ref 26.0–34.0)
MCHC: 34.8 g/dL (ref 32.0–36.0)
MCV: 79.7 fL — ABNORMAL LOW (ref 80.0–100.0)
PLATELETS: 322 10*3/uL (ref 150–440)
RBC: 3.75 MIL/uL — ABNORMAL LOW (ref 3.80–5.20)
RDW: 14.3 % (ref 11.5–14.5)
WBC: 20.3 10*3/uL — AB (ref 3.6–11.0)

## 2017-08-28 LAB — MRSA PCR SCREENING: MRSA by PCR: NEGATIVE

## 2017-08-28 LAB — MAGNESIUM: MAGNESIUM: 1.6 mg/dL — AB (ref 1.7–2.4)

## 2017-08-28 LAB — PROCALCITONIN

## 2017-08-28 MED ORDER — BUDESONIDE 0.25 MG/2ML IN SUSP
0.2500 mg | Freq: Two times a day (BID) | RESPIRATORY_TRACT | Status: DC
  Administered 2017-08-28 – 2017-08-29 (×3): 0.25 mg via RESPIRATORY_TRACT
  Filled 2017-08-28 (×3): qty 2

## 2017-08-28 MED ORDER — ORAL CARE MOUTH RINSE
15.0000 mL | Freq: Two times a day (BID) | OROMUCOSAL | Status: DC
  Administered 2017-08-30: 15 mL via OROMUCOSAL

## 2017-08-28 MED ORDER — CHLORHEXIDINE GLUCONATE 0.12 % MT SOLN
15.0000 mL | Freq: Two times a day (BID) | OROMUCOSAL | Status: DC
  Administered 2017-08-28 (×2): 15 mL via OROMUCOSAL
  Filled 2017-08-28 (×2): qty 15

## 2017-08-28 MED ORDER — METHYLPREDNISOLONE SODIUM SUCC 40 MG IJ SOLR
40.0000 mg | Freq: Two times a day (BID) | INTRAMUSCULAR | Status: DC
  Administered 2017-08-28: 40 mg via INTRAVENOUS
  Filled 2017-08-28: qty 1

## 2017-08-28 MED ORDER — ONDANSETRON HCL 4 MG PO TABS: 4.0000 mg | ORAL_TABLET | ORAL | Status: DC | PRN

## 2017-08-28 MED ORDER — MORPHINE SULFATE (PF) 2 MG/ML IV SOLN
1.0000 mg | INTRAVENOUS | Status: DC | PRN
  Administered 2017-08-28: 2 mg via INTRAVENOUS
  Administered 2017-08-28 (×2): 1 mg via INTRAVENOUS
  Administered 2017-08-28 – 2017-08-29 (×2): 2 mg via INTRAVENOUS
  Administered 2017-08-29: 1 mg via INTRAVENOUS
  Filled 2017-08-28 (×6): qty 1

## 2017-08-28 MED ORDER — POTASSIUM CHLORIDE 10 MEQ/100ML IV SOLN
10.0000 meq | INTRAVENOUS | Status: AC
  Administered 2017-08-28 (×4): 10 meq via INTRAVENOUS
  Filled 2017-08-28 (×4): qty 100

## 2017-08-28 MED ORDER — POTASSIUM CHLORIDE CRYS ER 20 MEQ PO TBCR: 40.0000 meq | EXTENDED_RELEASE_TABLET | Freq: Once | ORAL | Status: DC

## 2017-08-28 MED ORDER — ONDANSETRON HCL 4 MG/2ML IJ SOLN
4.0000 mg | Freq: Four times a day (QID) | INTRAMUSCULAR | Status: DC | PRN
  Administered 2017-08-28 – 2017-09-12 (×6): 4 mg via INTRAVENOUS
  Filled 2017-08-28 (×5): qty 2

## 2017-08-28 MED ORDER — OXYCODONE-ACETAMINOPHEN 7.5-325 MG PO TABS
1.0000 | ORAL_TABLET | Freq: Four times a day (QID) | ORAL | Status: DC | PRN
  Administered 2017-08-28: 1 via ORAL
  Filled 2017-08-28: qty 1

## 2017-08-28 MED ORDER — MAGNESIUM SULFATE 2 GM/50ML IV SOLN
2.0000 g | Freq: Once | INTRAVENOUS | Status: AC
  Administered 2017-08-28: 2 g via INTRAVENOUS
  Filled 2017-08-28: qty 50

## 2017-08-28 MED ORDER — METHYLPREDNISOLONE SODIUM SUCC 125 MG IJ SOLR
60.0000 mg | Freq: Four times a day (QID) | INTRAMUSCULAR | Status: DC
  Administered 2017-08-28: 60 mg via INTRAVENOUS
  Filled 2017-08-28: qty 2

## 2017-08-28 MED ORDER — DEXTROSE 5 % IV SOLN
500.0000 mg | INTRAVENOUS | Status: DC
  Administered 2017-08-28 – 2017-09-01 (×5): 500 mg via INTRAVENOUS
  Filled 2017-08-28 (×6): qty 500

## 2017-08-28 MED ORDER — FUROSEMIDE 10 MG/ML IJ SOLN
20.0000 mg | INTRAMUSCULAR | Status: AC
  Administered 2017-08-28: 20 mg via INTRAVENOUS
  Filled 2017-08-28: qty 2

## 2017-08-28 MED ORDER — IPRATROPIUM-ALBUTEROL 0.5-2.5 (3) MG/3ML IN SOLN: 3.0000 mL | Freq: Four times a day (QID) | RESPIRATORY_TRACT | Status: DC | PRN

## 2017-08-28 NOTE — Consult Note (Signed)
Name: Morgan [name] MRN: [id number] DOB: May 02, 1951    ADMISSION DATE:  08/26/2017 CONSULTATION DATE: 08/28/2017  REFERRING MD : Dr. Anselm Jungling   CHIEF COMPLAINT: Shortness of Breath   BRIEF PATIENT DESCRIPTION:  66 yo female admitted to medsurg unit 09/10 with acute hypoxic respiratory failure secondary to multifocal pneumonia transferred to ICU 09/12 due to worsening respiratory failure requiring continuous Bipap   SIGNIFICANT EVENTS  09/10-Pt admitted to [facility] unit  09/12-Pt transferred to ICU   STUDIES:  CT Chest 09/12>> Echo 09/12>>  HISTORY OF PRESENT ILLNESS:   This is a 66 yo female with PMH of Former Smoker, Neuromuscular Disorder, HTN, Depression, Arthritis, Anxiety, Anemia, and Allergies.  She presented to [facility] ER 09/10 with c/o fever, cough, malaise, and myalgias onset of symptoms 3 days prior to presentation to ER.  She also states she's had diarrhea for the last 3 months on average 6 stools daily.  In the ER CXR revealed multifocal pneumonia and she met sepsis criteria, therefore iv abx were initiated.  She was subsequently admitted to the [facility] unit by hospitalist team for further workup and treatment.  On 09/12 she developed worsening hypoxia, therefore she was placed on a non rebreather mask with O2 sats remaining in the 80's.  She was subsequently placed on continuous Bipap and transferred to ICU 09/12 and PCCM consulted.   PAST MEDICAL HISTORY :   has a past medical history of Allergy; Anemia; Anxiety; Arthritis; Depression; Hypertension; and Neuromuscular disorder ([place]).  has a past surgical history that includes Appendectomy; Breast surgery; Tubal ligation; Abdominal hysterectomy; and Knee surgery (2005). Prior to Admission medications   Medication Sig Start Date End Date Taking? Authorizing Provider  acetaminophen (TYLENOL) 500 MG tablet Take 1,000 mg by mouth every 6 (six) hours as needed for moderate pain (pain).    Yes [provider]  albuterol  (PROVENTIL HFA;VENTOLIN HFA) 108 (90 BASE) MCG/ACT inhaler Inhale 2 puffs into the lungs every 4 (four) hours as needed for wheezing or shortness of breath. 02/24/15  Yes Ghimire, Henreitta Leber, MD  amLODipine (NORVASC) 2.5 MG tablet Take 2.5 mg by mouth daily.   Yes [provider]  ARIPiprazole (ABILIFY) 15 MG tablet Take 15 mg by mouth daily.    Yes [provider]  azithromycin (ZITHROMAX) 250 MG tablet Take 1 tablet (250 mg total) by mouth daily. 1 tablet daily starting 05/16/2016 05/15/16  Yes Orlie Dakin, MD  benztropine (COGENTIN) 0.5 MG tablet Take 0.5 mg by mouth 2 (two) times daily.   Yes [provider]  FLUoxetine (PROZAC) 20 MG capsule Take 20 mg by mouth daily.   Yes [provider]  gabapentin (NEURONTIN) 400 MG capsule Take 400 mg by mouth 3 (three) times daily as needed.    Yes [provider]  hydrOXYzine (VISTARIL) 25 MG capsule Take 25-50 mg by mouth 3 (three) times daily as needed. Take 25 mg by mouth twice daily and take 50 mg by mouth at bedtime as needed   Yes [provider]  mirtazapine (REMERON) 45 MG tablet Take 45 mg by mouth at bedtime.    Yes [provider]  pantoprazole (PROTONIX) 40 MG tablet Take 40 mg by mouth daily.   Yes [provider]  potassium chloride (K-DUR) 10 MEQ tablet Take 10 mEq by mouth daily.   Yes [provider]   Allergies  Allergen Reactions  . Demerol [Meperidine]     Hives   . Strawberry (Diagnostic) Hives  FAMILY HISTORY:  family history includes Breast cancer in her maternal aunt, maternal aunt, and mother; Cancer in her maternal aunt, maternal aunt, maternal grandmother, mother, paternal grandfather, and paternal grandmother; Hyperlipidemia in her father; Hypertension in her father. SOCIAL HISTORY:  reports that she has quit smoking. Her smoking use included Cigarettes. She has never used smokeless tobacco. She reports that she does not drink alcohol or  use drugs.  REVIEW OF SYSTEMS: Positives in BOLD   Constitutional: fever, chills, weight loss, malaise/fatigue and diaphoresis.  HENT: Negative for hearing loss, ear pain, nosebleeds, congestion, sore throat, neck pain, tinnitus and ear discharge.   Eyes: Negative for blurred vision, double vision, photophobia, pain, discharge and redness.  Respiratory: cough, hemoptysis, sputum production, shortness of breath, wheezing and stridor.   Cardiovascular: Negative for chest pain, palpitations, orthopnea, claudication, leg swelling and PND.  Gastrointestinal: Negative for heartburn, nausea, vomiting, abdominal pain, diarrhea, constipation, blood in stool and melena.  Genitourinary: Negative for dysuria, urgency, frequency, hematuria and flank pain.  Musculoskeletal: Negative for myalgias, back pain, joint pain and falls.  Skin: Negative for itching and rash.  Neurological: Negative for dizziness, tingling, tremors, sensory change, speech change, focal weakness, seizures, loss of consciousness, weakness and headaches.  Endo/Heme/Allergies: Negative for environmental allergies and polydipsia. Does not bruise/bleed easily.  SUBJECTIVE:  Pt stating breathing has improved since she was placed on Bipap.   VITAL SIGNS: Temp:  [98.4 F (36.9 C)-98.8 F (37.1 C)] 98.8 F (37.1 C) (09/12 0745) Pulse Rate:  [91-95] 95 (09/12 0745) Resp:  [16-20] 16 (09/12 0745) BP: (114-137)/(87-103) 137/87 (09/12 0745) SpO2:  [82 %-98 %] 91 % (09/12 0754) FiO2 (%):  [100 %] 100 % (09/12 0754)  PHYSICAL EXAMINATION: General: acutely ill appearing Caucasian female  Neuro: alert and oriented, follows commands  HEENT: supple, mild JVD  Cardiovascular: sinus arrhythmia, no M/R/G Lungs: faint inspiratory wheezes and crackles throughout, even, non labored on continuous Bipap Abdomen: +BS x4, soft, obese, non tender, non distended  Musculoskeletal: normal bulk and tone, no edema  Skin: intact no rashes or lesions     Recent Labs Lab 08/26/17 1700 08/27/17 0405 08/28/17 0416  NA 140 140 137  K 2.6* 2.9* 3.2*  CL 97* 102 96*  CO2 29 31 31   BUN 10 10 10   CREATININE 0.82 0.79 0.53  GLUCOSE 116* 103* 144*    Recent Labs Lab 08/26/17 1700 08/27/17 0405 08/28/17 0416  HGB 10.3* 9.2* 10.4*  HCT 30.8* 27.7* 29.9*  WBC 15.3* 15.7* 20.3*  PLT 300 278 322   Dg Chest 2 View  Result Date: 08/26/2017 CLINICAL DATA:  Cough x3 weeks with vomiting and fever today. Mid diarrhea x3 months. History of mitral valve prolapse. Former smoker. EXAM: CHEST  2 VIEW COMPARISON:  09/18/2012 FINDINGS: Heart is normal in size. No aortic aneurysm. Patchy bilateral upper, right middle lobe and lingular airspace opacities are noted superimposed on chronic interstitial pulmonary change. Findings are consistent with multilobar pneumonia. No effusion or pneumothorax. Biapical pleuroparenchymal thickening and scarring is stable. No acute nor suspicious osseous lesions. IMPRESSION: Bilateral upper lobe, right middle lobe and lingular airspace opacities consistent with multilobar pneumonia. Followup PA and lateral chest X-ray is recommended in 3-4 weeks following trial of antibiotic therapy to ensure resolution and exclude underlying malignancy. Electronically Signed   By: Ashley Royalty M.D.   On: 08/26/2017 18:50   Dg Chest Port 1 View  Result Date: 08/27/2017 CLINICAL DATA:  Cough EXAM: PORTABLE CHEST 1 VIEW COMPARISON:  08/26/2017 FINDINGS: Cardiac shadow is stable. Previously seen bilateral multifocal infiltrates have increased in the interval from the prior exam particularly in the upper lobes. No sizable effusion is seen. No acute bony abnormality is noted. IMPRESSION: Increase in bilateral infiltrates when compared with the prior exam. Electronically Signed   By: Inez Catalina M.D.   On: 08/27/2017 20:04    ASSESSMENT / PLAN: Acute hypoxic respiratory failure secondary to multifocal pneumonia Diarrhea  Hypokalemia   Hypomagnesemia  Hx: Former Smoker, HTN, and Neuromuscular Disorder    P: Prn Bipap for dyspnea and to maintain O2 sats >92% Scheduled and prn bronchodilator therapy  Continue IV steroids will add nebulized steroids  Repeat CXR in am Trend WBC and monitor fever curve Trend PCT Continue vancomycin and cefepime, will add azithromycin for atypical coverage CT Chest pending  Legionella pneumophilia and Strep pneumoniae urinary antigen pending Follow cultures  Trend BMP Replace electrolytes as indicated Monitor UOP  Lovenox for VTE prophylaxis  Trend CBC   Marda Stalker, Melvin Pager [phone] (please enter 7 digits) PCCM Consult Pager [phone] (please enter 7 digits)  PCCM ATTENDING ATTESTATION:  I have evaluated patient with the APP [place], reviewed database in its entirety and discussed care plan in detail. In addition, this patient was discussed on multidisciplinary rounds.   Her history is consistent with an infectious process. She started out with a "cold" approximately 2 weeks ago. She developed increasing respiratory symptoms with cough and purulent sputum approximately one week ago. She was treated with a azithromycin without improvement. She was admitted 09/10 with pulmonary infiltrates. She developed progressive respiratory distress and hypoxemia. She is now transferred to ICU/SDU for noninvasive ventilation.  Important exam findings: Cognition intact Dyspneic with speech when BiPAP removed Bilateral crackles Coarse left basilar breath sounds (? Possible early pleural friction rub) RRR, no M NABS, NT Extremities warm, no edema  CXR: Increased bilateral, multi lobar pulmonary infiltrates  Major problems addressed by PCCM team: Acute hypoxemic respiratory failure with extensive bilateral pulmonary infiltrates. This appears to be infectious in etiology. There is probably a component of a ALI/ARDS. This is less likely to be pulmonary edema.  It is possible that she has a noninfectious pneumonitis.   She is well supported on BiPAP  PLAN/REC: Continue BiPAP as needed Continue vancomycin and cefepime And azithromycin for atypical coverage Continue systemic steroids Check BNP, PCT, ESR Check urine Legionella and strep antigens Echocardiogram has been ordered Due to her tenuous status, I am going to cancel the CT chest for now   Merton Border, MD PCCM service Mobile [phone] Pager [phone] 08/28/2017 2:29 PM

## 2017-08-28 NOTE — Progress Notes (Signed)
Pt's IPAP was increased from 12 to 16 to help her achieve adequate ventilation.

## 2017-08-28 NOTE — Progress Notes (Signed)
Notified family of transfer

## 2017-08-28 NOTE — Progress Notes (Signed)
[place] at [place] NAME: Morgan [name]    MR#:  [id number]  DATE OF BIRTH:  1951-10-19  SUBJECTIVE:  CHIEF COMPLAINT:  Very SOB overnight, and from room air yesterday, today on NRBM.  REVIEW OF SYSTEMS:  CONSTITUTIONAL: No fever, fatigue or weakness.  EYES: No blurred or double vision.  EARS, NOSE, AND THROAT: No tinnitus or ear pain. Reporting migraine headache RESPIRATORY: Reporting cough, shortness of breath,no wheezing or hemoptysis.  CARDIOVASCULAR: No chest pain, orthopnea, edema.  GASTROINTESTINAL: No nausea, vomiting, diarrhea or abdominal pain.  GENITOURINARY: No dysuria, hematuria.  ENDOCRINE: No polyuria, nocturia,  HEMATOLOGY: No anemia, easy bruising or bleeding SKIN: No rash or lesion. MUSCULOSKELETAL: No joint pain or arthritis.   NEUROLOGIC: No tingling, numbness, weakness.  PSYCHIATRY: No anxiety or depression.   DRUG ALLERGIES:   Allergies  Allergen Reactions  . Demerol [Meperidine]     Hives   . Strawberry (Diagnostic) Hives    VITALS:  Blood pressure 137/87, pulse 95, temperature 98.8 F (37.1 C), temperature source Oral, resp. rate 16, height 6\' 1"  (1.854 m), weight 94.8 kg (209 lb), SpO2 91 %.  PHYSICAL EXAMINATION:  GENERAL:  66 y.o.-year-old patient lying in the bed with no acute distress.  EYES: Pupils equal, round, reactive to light and accommodation. No scleral icterus. Extraocular muscles intact.  HEENT: Head atraumatic, normocephalic. Oropharynx and nasopharynx clear.  NECK:  Supple, no jugular venous distention. No thyroid enlargement, no tenderness.  LUNGS: Moderate breath sounds bilaterally, positive extensive crackles no wheezing,   Positive for use of accessory muscles of respiration. On NRBM CARDIOVASCULAR: S1, S2 normal. No murmurs, rubs, or gallops.  ABDOMEN: Soft, nontender, nondistended. Bowel sounds present. No organomegaly or mass.  EXTREMITIES: No pedal edema, cyanosis, or  clubbing.  NEUROLOGIC: Cranial nerves II through XII are intact. Muscle strength 5/5 in all extremities. Sensation intact. Gait not checked.  PSYCHIATRIC: The patient is alert and oriented x 3.  SKIN: No obvious rash, lesion, or ulcer.      LABORATORY PANEL:   CBC  Recent Labs Lab 08/28/17 0416  WBC 20.3*  HGB 10.4*  HCT 29.9*  PLT 322   ------------------------------------------------------------------------------------------------------------------  Chemistries   Recent Labs Lab 08/26/17 1700  08/28/17 0416  NA 140  < > 137  K 2.6*  < > 3.2*  CL 97*  < > 96*  CO2 29  < > 31  GLUCOSE 116*  < > 144*  BUN 10  < > 10  CREATININE 0.82  < > 0.53  CALCIUM 8.4*  < > 8.3*  MG  --   --  1.6*  AST 22  --   --   ALT 11*  --   --   ALKPHOS 121  --   --   BILITOT 0.3  --   --   < > = values in this interval not displayed. ------------------------------------------------------------------------------------------------------------------  Cardiac Enzymes No results for input(s): TROPONINI in the last 168 hours. ------------------------------------------------------------------------------------------------------------------  RADIOLOGY:  Dg Chest 2 View  Result Date: 08/26/2017 CLINICAL DATA:  Cough x3 weeks with vomiting and fever today. Mid diarrhea x3 months. History of mitral valve prolapse. Former smoker. EXAM: CHEST  2 VIEW COMPARISON:  09/18/2012 FINDINGS: Heart is normal in size. No aortic aneurysm. Patchy bilateral upper, right middle lobe and lingular airspace opacities are noted superimposed on chronic interstitial pulmonary change. Findings are consistent with multilobar pneumonia. No effusion or pneumothorax. Biapical pleuroparenchymal thickening and scarring is  stable. No acute nor suspicious osseous lesions. IMPRESSION: Bilateral upper lobe, right middle lobe and lingular airspace opacities consistent with multilobar pneumonia. Followup PA and lateral chest X-ray is  recommended in 3-4 weeks following trial of antibiotic therapy to ensure resolution and exclude underlying malignancy. Electronically Signed   By: Ashley Royalty M.D.   On: 08/26/2017 18:50   Dg Chest Port 1 View  Result Date: 08/27/2017 CLINICAL DATA:  Cough EXAM: PORTABLE CHEST 1 VIEW COMPARISON:  08/26/2017 FINDINGS: Cardiac shadow is stable. Previously seen bilateral multifocal infiltrates have increased in the interval from the prior exam particularly in the upper lobes. No sizable effusion is seen. No acute bony abnormality is noted. IMPRESSION: Increase in bilateral infiltrates when compared with the prior exam. Electronically Signed   By: Inez Catalina M.D.   On: 08/27/2017 20:04    EKG:   Orders placed or performed during the hospital encounter of 02/22/15  . EKG 12-Lead  . EKG 12-Lead    ASSESSMENT AND PLAN:   * Sepsis ([place]) -Secondary to healthcare associated pneumonia, multilobar pneumonia  IV antibiotics cefepime and vanc   hemodynamically stable Lactic acid normal at 1.9 Follow-up on the blood cultures are pending Sputum cx are not sent.  Worsening respi failure today, get CT chest Transfer to ICU, I spoke to Dr. Alva Garnet.  # hypokalemia replete and recheck   # Migraine headache Excedrin Migraine as needed   #  Essential hypertension - continue home med amlodipine     #depression and anxiety continue home medication Prozac      All the records are reviewed and case discussed with Care Management/Social Workerr. Management plans discussed with the patient, family and they are in agreement.  CODE STATUS: fc   TOTAL TIME TAKING CARE OF THIS PATIENT: 40 critical care  minutes.   POSSIBLE D/C IN 2  DAYS, DEPENDING ON CLINICAL CONDITION.  Note: This dictation was prepared with Dragon dictation along with smaller phrase technology. Any transcriptional errors that result from this process are unintentional.   Vaughan Basta M.D on 08/28/2017 at 10:18  AM  Between 7am to 6pm - Pager - [phone] After 6pm go to [email or web address] - password [email or web address] [facility] [facility]  Office  [phone]  CC: Primary care physician; Javier Docker, MD

## 2017-08-28 NOTE — Progress Notes (Signed)
Family Meeting Note  Advance Directive:no  Today a meeting took place with the Patient.   The following clinical team members were present during this meeting:MD and RN  The following were discussed:Patient's diagnosis: multi lobar pneumonia, worsening respi failure , Patient's progosis: Unable to determine and Goals for treatment: Full Code  I discussed about the worsening condition and possibility of requiring ventilator, she agreed to have full scope of treatment. She want to appoint her aunt as her 49, called chaplin to help her with paper works.  Additional follow-up to be provided: ICU consult.  Time spent during discussion:20 minutes  Morgan [name], Rosalio Macadamia, MD

## 2017-08-28 NOTE — Progress Notes (Signed)
Chaplain responded to an OR for Advanced Directives. [place] met visited the pt in [id number] but pt had been moved to [place]. [facility] met pt. Pt had breathing apparatus on her mouth, but [facility] was able to educated pt on Advanced Directives. Pt states she will review AD with a family member and will contact [place] when ready to complete AD. [place] offered prayers and pastoral presence.    08/28/17 1500  Clinical Encounter Type  Visited With Patient;Health care provider  Visit Type Initial;Spiritual support;Other (Comment)  Referral From Nurse  Consult/Referral To Chaplain  Spiritual Encounters  Spiritual Needs Brochure;Prayer

## 2017-08-28 NOTE — Progress Notes (Signed)
Pharmacy Antibiotic Note  Morgan [name] is a 66 y.o. female admitted on 08/26/2017 with pneumonia.  Pharmacy has been consulted for vanc/cefepime dosing. Azithromycin added 9/12.   Plan: After discussion with CCM, will d/c vancomycin due to negative MRSA PCR.    Continue cefepime 1g IV q8h.   Height: 6\' 1"  (185.4 cm) Weight: 209 lb (94.8 kg) IBW/kg (Calculated) : 75.4  Temp (24hrs), Avg:98.6 F (37 C), Min:98.4 F (36.9 C), Max:98.8 F (37.1 C)   Recent Labs Lab 08/26/17 1700 08/26/17 2148 08/27/17 0405 08/27/17 1903 08/28/17 0416  WBC 15.3*  --  15.7*  --  20.3*  CREATININE 0.82  --  0.79  --  0.53  LATICACIDVEN  --  1.9  --   --   --   VANCOTROUGH  --   --   --  18  --     Estimated Creatinine Clearance: 93.3 mL/min (by C-G formula based on SCr of 0.53 mg/dL).    Allergies  Allergen Reactions  . Demerol [Meperidine]     Hives   . Strawberry (Diagnostic) Hives   ABX: Vancomycin 9/10 >> 9/12 Cefepime 9/10 >> Azithromycin 9/12 >>  Micro: 9/12 MRSA PCR: negative 9/10 BCx: NGTD 9/10 C diff: negative  Thank you for allowing pharmacy to be a part of this patient's care.  Napoleon Form, PharmD, BCPS Clinical Pharmacist 08/28/2017 2:52 PM

## 2017-08-28 NOTE — Progress Notes (Signed)
Pt O2 sats dropped into 70s at the beginning of the shift. Placed on non re-breather to achieve sats above 90. Expiratory wheezing and crackles auscultated bilaterally. RN called respiratory and MD. Respiratory assessed the Pt. Pt lung sounds have improved and pt verbalizes feeling better after non re-breather, Lasix, duo nebs, and CXR orders implemented. No acute distress at this time. Will continue to monitor.

## 2017-08-28 NOTE — Progress Notes (Signed)
Patient on non-rebreather on 15L upon taking over assignment 88%. Patient is A&Ox4. Refused oral meds and food relating to nausea. MD new orders for CT and chaplain upon rounding. Patient stated to reach out to her aunt to help with decisions at this time if things progressed. O2 continued to decline. MD/RT notified, new orders to transport to stepdown. Bedside report given

## 2017-08-29 ENCOUNTER — Inpatient Hospital Stay: Payer: Medicare HMO

## 2017-08-29 ENCOUNTER — Inpatient Hospital Stay (HOSPITAL_COMMUNITY)
Admit: 2017-08-29 | Discharge: 2017-08-29 | Disposition: A | Discharge status: Home / Self Care | Payer: Medicare HMO | Attending: Internal Medicine | Admitting: Internal Medicine

## 2017-08-29 DIAGNOSIS — I340 Nonrheumatic mitral (valve) insufficiency: Secondary | ICD-10-CM

## 2017-08-29 DIAGNOSIS — J189 Pneumonia, unspecified organism: Secondary | ICD-10-CM

## 2017-08-29 DIAGNOSIS — A419 Sepsis, unspecified organism: Principal | ICD-10-CM

## 2017-08-29 LAB — COMPREHENSIVE METABOLIC PANEL
ALBUMIN: 2.4 g/dL — AB (ref 3.5–5.0)
ALT: 12 U/L — ABNORMAL LOW (ref 14–54)
ANION GAP: 11 (ref 5–15)
AST: 22 U/L (ref 15–41)
Alkaline Phosphatase: 105 U/L (ref 38–126)
BUN: 19 mg/dL (ref 6–20)
CO2: 32 mmol/L (ref 22–32)
Calcium: 8.3 mg/dL — ABNORMAL LOW (ref 8.9–10.3)
Chloride: 91 mmol/L — ABNORMAL LOW (ref 101–111)
Creatinine, Ser: 0.7 mg/dL (ref 0.44–1.00)
GFR calc Af Amer: >60 mL/min (ref >60)
GFR calc non Af Amer: >60 mL/min (ref >60)
GLUCOSE: 136 mg/dL — AB (ref 65–99)
POTASSIUM: 3.4 mmol/L — AB (ref 3.5–5.1)
SODIUM: 134 mmol/L — AB (ref 135–145)
Total Bilirubin: 0.4 mg/dL (ref 0.3–1.2)
Total Protein: 5.8 g/dL — ABNORMAL LOW (ref 6.5–8.1)

## 2017-08-29 LAB — CBC
HCT: 27.7 % — ABNORMAL LOW (ref 35.0–47.0)
Hemoglobin: 9.2 g/dL — ABNORMAL LOW (ref 12.0–16.0)
MCH: 26.4 pg (ref 26.0–34.0)
MCHC: 33.3 g/dL (ref 32.0–36.0)
MCV: 79.1 fL — ABNORMAL LOW (ref 80.0–100.0)
Platelets: 321 10*3/uL (ref 150–440)
RBC: 3.5 MIL/uL — ABNORMAL LOW (ref 3.80–5.20)
RDW: 14.1 % (ref 11.5–14.5)
WBC: 21.4 10*3/uL — ABNORMAL HIGH (ref 3.6–11.0)

## 2017-08-29 LAB — GLUCOSE, CAPILLARY
GLUCOSE-CAPILLARY: 118 mg/dL — AB (ref 65–99)
GLUCOSE-CAPILLARY: 120 mg/dL — AB (ref 65–99)
GLUCOSE-CAPILLARY: 130 mg/dL — AB (ref 65–99)
GLUCOSE-CAPILLARY: 150 mg/dL — AB (ref 65–99)
Glucose-Capillary: 126 mg/dL — ABNORMAL HIGH (ref 65–99)
Glucose-Capillary: 102 mg/dL — ABNORMAL HIGH (ref 65–99)

## 2017-08-29 LAB — ECHOCARDIOGRAM COMPLETE
Height: 73 in
WEIGHTICAEL: 3344 [oz_av]

## 2017-08-29 LAB — TROPONIN I: Troponin I: 0.67 ng/mL (ref <0.03)

## 2017-08-29 LAB — PROCALCITONIN: PROCALCITONIN: 0.24 ng/mL

## 2017-08-29 LAB — STREP PNEUMONIAE URINARY ANTIGEN: STREP PNEUMO URINARY ANTIGEN: NEGATIVE

## 2017-08-29 LAB — TRIGLYCERIDES: Triglycerides: 107 mg/dL (ref <150)

## 2017-08-29 LAB — MAGNESIUM: Magnesium: 2 mg/dL (ref 1.7–2.4)

## 2017-08-29 MED ORDER — VITAL AF 1.2 CAL PO LIQD
1000.0000 mL | ORAL | Status: DC
  Administered 2017-08-29 – 2017-08-30 (×2): 1000 mL

## 2017-08-29 MED ORDER — SODIUM CHLORIDE 0.9% FLUSH
10.0000 mL | INTRAVENOUS | Status: DC | PRN
  Administered 2017-08-30: 10 mL
  Filled 2017-08-29: qty 40

## 2017-08-29 MED ORDER — VITAL HIGH PROTEIN PO LIQD: 1000.0000 mL | ORAL | Status: DC

## 2017-08-29 MED ORDER — FENTANYL CITRATE (PF) 100 MCG/2ML IJ SOLN
INTRAMUSCULAR | Status: AC
  Administered 2017-08-29: 100 ug
  Filled 2017-08-29: qty 4

## 2017-08-29 MED ORDER — POTASSIUM CHLORIDE 20 MEQ PO PACK
40.0000 meq | PACK | ORAL | Status: AC
  Administered 2017-08-29 (×2): 40 meq via ORAL
  Filled 2017-08-29 (×2): qty 2

## 2017-08-29 MED ORDER — ROCURONIUM BROMIDE 50 MG/5ML IV SOLN
INTRAVENOUS | Status: AC
  Administered 2017-08-29: 50 mg
  Filled 2017-08-29: qty 1

## 2017-08-29 MED ORDER — POTASSIUM CHLORIDE 20 MEQ PO PACK
40.0000 meq | PACK | ORAL | Status: DC
Start: 2017-08-29 — End: 2017-08-29
  Filled 2017-08-29: qty 2

## 2017-08-29 MED ORDER — SODIUM CHLORIDE 0.9% FLUSH
10.0000 mL | Freq: Two times a day (BID) | INTRAVENOUS | Status: DC
  Administered 2017-08-29: 10 mL
  Administered 2017-08-30: 20 mL
  Administered 2017-08-31: 10 mL
  Administered 2017-08-31: 3 mL
  Administered 2017-09-01: 10 mL
  Administered 2017-09-02: 20 mL
  Administered 2017-09-02: 10 mL
  Administered 2017-09-03: 20 mL
  Administered 2017-09-03 – 2017-09-05 (×4): 10 mL
  Administered 2017-09-05: 20 mL
  Administered 2017-09-06: 10 mL
  Administered 2017-09-07: 3 mL
  Administered 2017-09-07: 20 mL
  Administered 2017-09-08 – 2017-09-11 (×8): 10 mL
  Administered 2017-09-12: 09:00:00 20 mL

## 2017-08-29 MED ORDER — ORAL CARE MOUTH RINSE
15.0000 mL | Freq: Four times a day (QID) | OROMUCOSAL | Status: DC
  Administered 2017-08-29 – 2017-08-30 (×3): 15 mL via OROMUCOSAL

## 2017-08-29 MED ORDER — MIDAZOLAM HCL 2 MG/2ML IJ SOLN
INTRAMUSCULAR | Status: AC
  Administered 2017-08-29: 14:00:00
  Filled 2017-08-29: qty 4

## 2017-08-29 MED ORDER — PROPOFOL 1000 MG/100ML IV EMUL
0.0000 ug/kg/min | INTRAVENOUS | Status: DC
  Administered 2017-08-29: 30 ug/kg/min via INTRAVENOUS
  Administered 2017-08-29: 5 ug/kg/min via INTRAVENOUS
  Administered 2017-08-30: 20 ug/kg/min via INTRAVENOUS
  Administered 2017-08-30: 30 ug/kg/min via INTRAVENOUS
  Administered 2017-08-30: 20 ug/kg/min via INTRAVENOUS
  Administered 2017-08-31 – 2017-09-01 (×2): 10 ug/kg/min via INTRAVENOUS
  Administered 2017-09-01: 25 ug/kg/min via INTRAVENOUS
  Administered 2017-09-01 – 2017-09-02 (×2): 20 ug/kg/min via INTRAVENOUS
  Filled 2017-08-29 (×11): qty 100

## 2017-08-29 MED ORDER — FENTANYL BOLUS VIA INFUSION
50.0000 ug | INTRAVENOUS | Status: DC | PRN
  Administered 2017-08-31 – 2017-09-03 (×9): 50 ug via INTRAVENOUS
  Filled 2017-08-29: qty 50

## 2017-08-29 MED ORDER — FENTANYL CITRATE (PF) 100 MCG/2ML IJ SOLN
50.0000 ug | Freq: Once | INTRAMUSCULAR | Status: AC
  Administered 2017-08-29: 50 ug via INTRAVENOUS

## 2017-08-29 MED ORDER — FENTANYL 2500MCG IN NS 250ML (10MCG/ML) PREMIX INFUSION
0.0000 ug/h | INTRAVENOUS | Status: DC
  Administered 2017-08-29: 25 ug/h via INTRAVENOUS
  Administered 2017-08-31 – 2017-09-02 (×2): 50 ug/h via INTRAVENOUS
  Administered 2017-09-03 – 2017-09-04 (×2): 80 ug/h via INTRAVENOUS
  Filled 2017-08-29 (×5): qty 250

## 2017-08-29 MED ORDER — DOCUSATE SODIUM 50 MG/5ML PO LIQD
100.0000 mg | Freq: Two times a day (BID) | ORAL | Status: DC | PRN
  Filled 2017-08-29: qty 10

## 2017-08-29 MED ORDER — CHLORHEXIDINE GLUCONATE 0.12% ORAL RINSE (MEDLINE KIT)
15.0000 mL | Freq: Two times a day (BID) | OROMUCOSAL | Status: DC
  Administered 2017-08-29 (×2): 15 mL via OROMUCOSAL

## 2017-08-29 MED ORDER — BUDESONIDE 0.5 MG/2ML IN SUSP
0.5000 mg | Freq: Two times a day (BID) | RESPIRATORY_TRACT | Status: DC
  Administered 2017-08-29 – 2017-09-02 (×8): 0.5 mg via RESPIRATORY_TRACT
  Filled 2017-08-29 (×8): qty 2

## 2017-08-29 MED ORDER — FUROSEMIDE 10 MG/ML IJ SOLN
40.0000 mg | Freq: Once | INTRAMUSCULAR | Status: AC
  Administered 2017-08-29: 40 mg via INTRAVENOUS
  Filled 2017-08-29: qty 4

## 2017-08-29 MED ORDER — VANCOMYCIN HCL IN DEXTROSE 1-5 GM/200ML-% IV SOLN
1000.0000 mg | Freq: Three times a day (TID) | INTRAVENOUS | Status: DC
  Administered 2017-08-29 – 2017-08-30 (×4): 1000 mg via INTRAVENOUS
  Filled 2017-08-29 (×7): qty 200

## 2017-08-29 NOTE — Progress Notes (Signed)
*  PRELIMINARY RESULTS* Echocardiogram 2D Echocardiogram has been performed.  Sherrie Sport 08/29/2017, 8:51 AM

## 2017-08-29 NOTE — Progress Notes (Signed)
Peripherally Inserted Central Catheter/Midline Placement  The IV Nurse has discussed with the patient and/or persons authorized to consent for the patient, the purpose of this procedure and the potential benefits and risks involved with this procedure.  The benefits include less needle sticks, lab draws from the catheter, and the patient may be discharged home with the catheter. Risks include, but not limited to, infection, bleeding, blood clot (thrombus formation), and puncture of an artery; nerve damage and irregular heartbeat and possibility to perform a PICC exchange if needed/ordered by physician.  Alternatives to this procedure were also discussed.  Bard Power PICC patient education guide, fact sheet on infection prevention and patient information card has been provided to patient /or left at bedside.    PICC/Midline Placement Documentation        Lorain Fettes, Nicolette Bang 08/29/2017, 4:40 PM

## 2017-08-29 NOTE — Progress Notes (Signed)
Pharmacy Antibiotic Note  Morgan [name] is a 66 y.o. female admitted on 08/26/2017 with pneumonia.  Pharmacy has been consulted for vanc/cefepime dosing. Azithromycin added 9/12.   Plan: Vancomycin reinitiated 9/13 due to declining status clinically and on Xray. Will continue vancomycin 1g IV Q8hr. Will obtain trough prior to 5th dose.   Continue cefepime 1g IV q8h.   Height: 6\' 1"  (185.4 cm) Weight: 197 lb 8.5 oz (89.6 kg) IBW/kg (Calculated) : 75.4  Temp (24hrs), Avg:99 F (37.2 C), Min:98.4 F (36.9 C), Max:99.9 F (37.7 C)   Recent Labs Lab 08/26/17 1700 08/26/17 2148 08/27/17 0405 08/27/17 1903 08/28/17 0416 08/29/17 0451  WBC 15.3*  --  15.7*  --  20.3* 21.4*  CREATININE 0.82  --  0.79  --  0.53 0.70  LATICACIDVEN  --  1.9  --   --   --   --   VANCOTROUGH  --   --   --  18  --   --     Estimated Creatinine Clearance: 84.6 mL/min (by C-G formula based on SCr of 0.7 mg/dL).    Allergies  Allergen Reactions  . Demerol [Meperidine]     Hives   . Strawberry (Diagnostic) Hives   ABX: Vancomycin 9/10 >> 9/12, 9/13 >> Cefepime 9/10 >> Azithromycin 9/12 >>  Micro: 9/12 MRSA PCR: negative 9/10 BCx: NGTD 9/10 C diff: negative  Thank you for allowing pharmacy to be a part of this patient's care.  Simpson,Michael L, PharmD, BCPS Clinical Pharmacist 08/29/2017 7:10 PM

## 2017-08-29 NOTE — Progress Notes (Signed)
Name: Morgan [name] MRN: [id number] DOB: 01-12-1951    ADMISSION DATE:  08/26/2017 CONSULTATION DATE: 08/28/2017  REFERRING MD : Dr. Anselm Jungling   CHIEF COMPLAINT: Shortness of Breath   BRIEF PATIENT DESCRIPTION:  66 yo female admitted to medsurg unit 09/10 with acute hypoxic respiratory failure secondary to component of ARDS and pneumonitis transferred to ICU 09/12 due to worsening respiratory failure requiring continuous Bipap   SIGNIFICANT EVENTS  09/10-Pt admitted to [facility] unit  09/12-Pt transferred to ICU   STUDIES:  Echo 09/12>>   SUBJECTIVE:  Pt states she gets severely short of breath when Bipap removed and states she's getting tired.   VITAL SIGNS: Temp:  [98.1 F (36.7 C)-99.9 F (37.7 C)] 98.8 F (37.1 C) (09/13 0900) Pulse Rate:  [70-113] 77 (09/13 0900) Resp:  [19-47] 34 (09/13 0900) BP: (99-147)/(65-104) 126/72 (09/13 0900) SpO2:  [90 %-98 %] 97 % (09/13 0900) FiO2 (%):  [65 %-70 %] 65 % (09/12 1526)  PHYSICAL EXAMINATION: General: acutely ill appearing Caucasian female  Neuro: alert and oriented, follows commands  HEENT: supple, no JVD  Cardiovascular: s1s2, rrrr, no M/R/G Lungs: faint inspiratory wheezes and crackles throughout, even, non labored on continuous Bipap  Abdomen: +BS x4, soft, obese, tenderness, non distended  Musculoskeletal: normal bulk and tone, no edema  Skin: intact no rashes or lesions    Recent Labs Lab 08/27/17 0405 08/28/17 0416 08/29/17 0451  NA 140 137 134*  K 2.9* 3.2* 3.4*  CL 102 96* 91*  CO2 31 31 32  BUN 10 10 19   CREATININE 0.79 0.53 0.70  GLUCOSE 103* 144* 136*    Recent Labs Lab 08/27/17 0405 08/28/17 0416 08/29/17 0451  HGB 9.2* 10.4* 9.2*  HCT 27.7* 29.9* 27.7*  WBC 15.7* 20.3* 21.4*  PLT 278 322 321   Dg Chest Port 1 View  Result Date: 08/29/2017 CLINICAL DATA:  Respiratory failure. EXAM: PORTABLE CHEST 1 VIEW COMPARISON:  08/28/2017. FINDINGS: Heart size is stable. Diffuse bilateral severe  airspace disease again noted with interim progression from prior exam. Small left pleural effusion. No pneumothorax. IMPRESSION: Diffuse severe bilateral airspace disease with progression from prior exam. Small left pleural effusion. Electronically Signed   By: Marcello Moores  Register   On: 08/29/2017 06:12   Dg Chest Port 1 View  Result Date: 08/28/2017 CLINICAL DATA:  Acute respiratory failure. EXAM: PORTABLE CHEST 1 VIEW COMPARISON:  Radiograph of August 27, 2017. FINDINGS: Stable cardiomediastinal silhouette. No pneumothorax is noted. No significant pleural effusion is noted. Stable bilateral airspace opacities are noted in the upper lobes most consistent with pneumonia or edema. Bony thorax is unremarkable. IMPRESSION: Stable bilateral lung opacities are noted concerning for pneumonia. Electronically Signed   By: Marijo Conception, M.D.   On: 08/28/2017 13:38   Dg Chest Port 1 View  Result Date: 08/27/2017 CLINICAL DATA:  Cough EXAM: PORTABLE CHEST 1 VIEW COMPARISON:  08/26/2017 FINDINGS: Cardiac shadow is stable. Previously seen bilateral multifocal infiltrates have increased in the interval from the prior exam particularly in the upper lobes. No sizable effusion is seen. No acute bony abnormality is noted. IMPRESSION: Increase in bilateral infiltrates when compared with the prior exam. Electronically Signed   By: Inez Catalina M.D.   On: 08/27/2017 20:04    ASSESSMENT / PLAN: Acute hypoxic respiratory failure secondary to component of ARDS and pneumonitis  Diarrhea  Hypokalemia  Hypomagnesemia  Hx: Former Smoker, HTN, and Neuromuscular Disorder    P: Prn Bipap for dyspnea and to maintain  O2 sats >92% Scheduled and prn bronchodilator therapy  Continue nebulized steroids will stop IV steroids  IV lasix x1 dose  Repeat CXR in am Trend WBC and monitor fever curve Trend PCT Continue vancomycin,cefepime, and azithromycin  Follow cultures  Trend BMP Replace electrolytes as indicated Monitor UOP   Lovenox for VTE prophylaxis  Trend CBC   Marda Stalker, [place] Pager [phone] (please enter 7 digits) PCCM Consult Pager [phone] (please enter 7 digits)   STAFF NOTE: I. Dr. Ashby Dawes, have personally reviewed the patient's available data including medical history , events of notes, physican examination and test results as part of my evaluation. I have discussed with the  Care with the NP and other care providers including  pharmacist, ICU RN, RRT, dietary.  Physical Exam  Lungs - Decreased air entry bilaterally.   Pt presented with progressive respiratory failure. I personally reviewed CXR films, there appears to be diffuse infiltrates suggestive of pneumonia, suspect aspiration, ARDS. Pt with high AA gradient, severly hypoxic, requiring 100% bipap with RR of 30-35.  Will intubate, continue empiric abx. Monitor closely. Pt was given an empiric dose of lasix with no improvement.   Marda Stalker, MD.   Board Certified in Internal Medicine, Pulmonary Medicine, [place], and Sleep Medicine.  [facility] Office Number: [phone] Pager: [phone]  Patricia Pesa, M.D.  Merton Border, M.D 08/29/2017  Critical Care Attestation.  I have personally obtained a history, examined the patient, evaluated laboratory and imaging results, formulated the assessment and plan and placed orders. The Patient requires high complexity decision making for assessment and support, frequent evaluation and titration of therapies, application of advanced monitoring technologies and extensive interpretation of multiple databases. The patient has critical illness that could lead imminently to failure of 1 or more organ systems and requires the highest level of physician preparedness to intervene.  Critical Care Time devoted to patient care services described in this note is 35 minutes and is exclusive of time spent in procedures  supervisory time of NP.

## 2017-08-29 NOTE — Progress Notes (Addendum)
Initial Nutrition Assessment  DOCUMENTATION CODES:   Not applicable  INTERVENTION:   Vital AF 1.2- Initiate at 2m/hr and increase by 153mhr every 12 hrs until goal rate of 6052mr is reached  Propofol: 5 ml/hr- provides 132kcal/day   Regimen provides 1860kcal/day, 108g/day protein, 1168m58mee water   NUTRITION DIAGNOSIS:   Inadequate oral intake related to inability to eat (pt sedated on vent) as evidenced by NPO status.  GOAL:   Provide needs based on ASPEN/SCCM guidelines  MONITOR:   Vent status, Labs, Weight trends, I & O's, TF tolerance  REASON FOR ASSESSMENT:   Consult Enteral/tube feeding initiation and management  ASSESSMENT:   66 y36female with PMH of Former Smoker, Neuromuscular Disorder, HTN, Depression, Arthritis, Anxiety, Anemia, and Allergies.  [facility] c/o fever, cough, malaise, and myalgias onset of symptoms 3 days prior to presentation to ER.  She also states she's had diarrhea for the last 3 months on average 6 stools daily.  In the ER CXR revealed multifocal pneumonia and she met sepsis criteria, therefore iv abx were initiated.  [facility] and treatment.  On 09/12 she developed worsening hypoxia, therefore she was placed on a non rebreather mask with O2 sats remaining in the 80's.  She was subsequently placed on continuous Bipap and transferred to ICU 09/12   Pt sedated and ventilated. No family at bedside. Elevated BNP; will hold off on free water flushes at this time. Pt with hyponatremia and hypokalemia; monitor and supplement as needed. Recommend check Phosphorus as pt not eating since admit and at risk for refeeding. Pt does not have OGT in place yet. Tube feeds ordered for when tube in place. There is no recent weight history in chart for this pt. Unsure if any recent wt loss. Unsure if pt is truly as tall as documented.      Medications reviewed  and include: lovenox, remeron, protonix, azithromax, cefepime, propofol, vancomycin, morphine, zofran   Labs reviewed: Na 134(L), K 3.4(L), Cl 91(L), Mg 2.0 wnl, Ca 8.3(L) adj. 9.58 wnl, alb 2.4(L) BNP- 2321(H)- 9/12 Wbc- 21.4(H), Hgb 9.2(L), Hct 27.7(L)  Nutrition-Focused physical exam completed. Findings are no fat depletion, no muscle depletion, and no edema.   Patient is currently intubated on ventilator support MV: 9.3 L/min Temp (24hrs), Avg:98.8 F (37.1 C), Min:98.3 F (36.8 C), Max:99.9 F (37.7 C)  Propofol: 5 ml/hr- provides 132kcal/day   Diet Order:  Diet Heart Room service appropriate? Yes; Fluid consistency: Thin  Skin:  Reviewed, no issues  Last BM:  9/10  Height:   Ht Readings from Last 1 Encounters:  08/26/17 6' 1"  (1.854 m)    Weight:   Wt Readings from Last 1 Encounters:  08/29/17 197 lb 8.5 oz (89.6 kg)    Ideal Body Weight:  75 kg  BMI:  Body mass index is 26.06 kg/m.  Estimated Nutritional Needs:   Kcal:  1804kcal/day   Protein:  97-113g/day   Fluid:  per MD  EDUCATION NEEDS:   Education needs no appropriate at this time  CaseKoleen Distance RD, LDN [place] #- [phone] Hours Pager: [phone]

## 2017-08-29 NOTE — Progress Notes (Signed)
[place] at [place] NAME: Morgan [name]    MR#:  [id number]  DATE OF BIRTH:  1951/01/13  SUBJECTIVE:  CHIEF COMPLAINT:  Was on NRBM , so transferred to ICU, remained on cont bipap, still have worsening in her Xray chest, so decided to intubate today.  REVIEW OF SYSTEMS:   Could not provide due to respi failure and intubation.   DRUG ALLERGIES:   Allergies  Allergen Reactions  . Demerol [Meperidine]     Hives   . Strawberry (Diagnostic) Hives    VITALS:  Blood pressure (!) 81/56, pulse 69, temperature 99.6 F (37.6 C), temperature source Oral, resp. rate 20, height 6\' 1"  (1.854 m), weight 89.6 kg (197 lb 8.5 oz), SpO2 97 %.  PHYSICAL EXAMINATION:  GENERAL:  66 y.o.-year-old patient lying in the bed with acute distress.  EYES: Pupils equal, round, reactive to light and accommodation. No scleral icterus. Extraocular muscles intact.  HEENT: Head atraumatic, normocephalic. Oropharynx and nasopharynx clear.  NECK:  Supple, no jugular venous distention. No thyroid enlargement, no tenderness.  LUNGS: Moderate breath sounds bilaterally, positive extensive crackles no wheezing,   Positive for use of accessory muscles of respiration. On BIPAP, ICU team was preparing for intubation, when I saw. CARDIOVASCULAR: S1, S2 normal. No murmurs, rubs, or gallops.  ABDOMEN: Soft, nontender, nondistended. Bowel sounds present. No organomegaly or mass.  EXTREMITIES: No pedal edema, cyanosis, or clubbing.  NEUROLOGIC: Cranial nerves II through XII are intact. Muscle strength 4-5/5 in all extremities. Sensation intact. Gait not checked.  PSYCHIATRIC: The patient is alert and oriented x 3. But distressed.  SKIN: No obvious rash, lesion, or ulcer.      LABORATORY PANEL:   CBC  Recent Labs Lab 08/29/17 0451  WBC 21.4*  HGB 9.2*  HCT 27.7*  PLT 321    ------------------------------------------------------------------------------------------------------------------  Chemistries   Recent Labs Lab 08/29/17 0451  NA 134*  K 3.4*  CL 91*  CO2 32  GLUCOSE 136*  BUN 19  CREATININE 0.70  CALCIUM 8.3*  MG 2.0  AST 22  ALT 12*  ALKPHOS 105  BILITOT 0.4   ------------------------------------------------------------------------------------------------------------------  Cardiac Enzymes  Recent Labs Lab 08/29/17 0451  TROPONINI 0.67*   ------------------------------------------------------------------------------------------------------------------  RADIOLOGY:  Dg Chest 1 View  Result Date: 08/29/2017 CLINICAL DATA:  Encounter for evaluation of nasogastric tube placement EXAM: CHEST 1 VIEW COMPARISON:  Portable exam 1445 hours compared to 0355 hours FINDINGS: New endotracheal tube with tip projecting 4.5 cm above carina. New nasogastric tube with tip projecting over stomach. Borderline enlargement of cardiac silhouette. Extensive BILATERAL pulmonary infiltrates, little changed. No gross pleural effusion or pneumothorax. Bones demineralized. IMPRESSION: Persistent severe diffuse BILATERAL pulmonary infiltrates. Electronically Signed   By: Lavonia Dana M.D.   On: 08/29/2017 15:02   Dg Abd 1 View  Result Date: 08/29/2017 CLINICAL DATA:  Abdominal pain EXAM: ABDOMEN - 1 VIEW COMPARISON:  01/19/2008 FINDINGS: Supine view the abdomen shows diffuse gaseous distention of large and small bowel without an overtly obstructive pattern. Lucency identified in the region of the left hemidiaphragm, nonspecific but likely related to left lower lobe disease better seen on the upright portable chest x-ray obtained at the same time and reported separately. Multiple phleboliths are seen over the inferior anatomic pelvis. There may be a stone in the lower pole the right kidney. Visualized bony anatomy is unremarkable. IMPRESSION: Diffuse gaseous bowel  distention without overt obstruction by x-ray. Electronically Signed   By: Randall Hiss  Tery Sanfilippo M.D.   On: 08/29/2017 10:17   Dg Chest Port 1 View  Result Date: 08/29/2017 CLINICAL DATA:  Confirm line placement. EXAM: PORTABLE CHEST 1 VIEW COMPARISON:  Portable chest x-ray of earlier today. FINDINGS: The patient has undergone placement of a right-sided PICC line. The tip projects over the midportion of the SVC. The endotracheal tube tip projects approximately 3.6 cm above the carina. The esophagogastric tube tip projects below the inferior margin of the image. The lungs are well-expanded. The interstitial markings remain increased with areas of patchy alveolar opacity greatest at the left base. The heart is top-normal in size. The pulmonary vascularity is not clearly engorged. IMPRESSION: Interval placement of right-sided PICC line with the tip in the region of the midportion of the SVC. The other support tubes are in reasonable position. Persistent bilateral interstitial and alveolar infiltrates compatible with pneumonia. Electronically Signed   By: David  Martinique M.D.   On: 08/29/2017 16:51   Dg Chest Port 1 View  Result Date: 08/29/2017 CLINICAL DATA:  Respiratory failure. EXAM: PORTABLE CHEST 1 VIEW COMPARISON:  08/28/2017. FINDINGS: Heart size is stable. Diffuse bilateral severe airspace disease again noted with interim progression from prior exam. Small left pleural effusion. No pneumothorax. IMPRESSION: Diffuse severe bilateral airspace disease with progression from prior exam. Small left pleural effusion. Electronically Signed   By: Marcello Moores  Register   On: 08/29/2017 06:12   Dg Chest Port 1 View  Result Date: 08/28/2017 CLINICAL DATA:  Acute respiratory failure. EXAM: PORTABLE CHEST 1 VIEW COMPARISON:  Radiograph of August 27, 2017. FINDINGS: Stable cardiomediastinal silhouette. No pneumothorax is noted. No significant pleural effusion is noted. Stable bilateral airspace opacities are noted in the  upper lobes most consistent with pneumonia or edema. Bony thorax is unremarkable. IMPRESSION: Stable bilateral lung opacities are noted concerning for pneumonia. Electronically Signed   By: Marijo Conception, M.D.   On: 08/28/2017 13:38   Dg Chest Port 1 View  Result Date: 08/27/2017 CLINICAL DATA:  Cough EXAM: PORTABLE CHEST 1 VIEW COMPARISON:  08/26/2017 FINDINGS: Cardiac shadow is stable. Previously seen bilateral multifocal infiltrates have increased in the interval from the prior exam particularly in the upper lobes. No sizable effusion is seen. No acute bony abnormality is noted. IMPRESSION: Increase in bilateral infiltrates when compared with the prior exam. Electronically Signed   By: Inez Catalina M.D.   On: 08/27/2017 20:04    EKG:   Orders placed or performed during the hospital encounter of 08/26/17  . EKG 12-Lead  . EKG 12-Lead  . EKG 12-Lead  . EKG 12-Lead    ASSESSMENT AND PLAN:   * Sepsis ([place]) -Secondary to healthcare associated pneumonia, multilobar pneumonia  IV antibiotics cefepime and vanc, added azithromycin. Lactic acid normal at 1.9 Follow-up on the blood cultures are pending Sputum cx are not sent.  Worsening respi failure - transferred to ICU. Remained on Cont Bipap, still worsening, Intubation.  # hypokalemia replete and recheck   # Migraine headache Excedrin Migraine as needed   #  Essential hypertension - continue home med amlodipine     #depression and anxiety continue home medication Prozac      All the records are reviewed and case discussed with Care Management/Social Workerr. Management plans discussed with the patient, family and they are in agreement.  CODE STATUS: fc   TOTAL TIME TAKING CARE OF THIS PATIENT: 40  minutes.   POSSIBLE D/C IN 2-3  DAYS, DEPENDING ON CLINICAL CONDITION.  Note: This dictation was  prepared with Dragon dictation along with smaller phrase technology. Any transcriptional errors that result from this process are  unintentional.   Vaughan Basta M.D on 08/29/2017 at 6:12 PM  Between 7am to 6pm - Pager - [phone] After 6pm go to [email or web address] - password [email or web address] [facility]  Office  [phone]  CC: Primary care physician; Javier Docker, MD

## 2017-08-29 NOTE — Procedures (Signed)
Endotracheal Intubation: Patient required placement of an artificial airway secondary to resp failure.   Consent: Emergent.   Hand washing performed prior to starting the procedure.   Medications administered for sedation prior to procedure: Midazolam 4 mg IV,  Vecuronium 10 mg IV, Fentanyl 200 mcg IV.   Procedure: A time out procedure was called and correct patient, name, & ID confirmed. Needed supplies and equipment were assembled and checked to include ETT, 10 ml syringe, Glidescope, Mac and Miller blades, suction, oxygen and bag mask valve, end tidal CO2 monitor. Patient was positioned to align the mouth and pharynx to facilitate visualization of the glottis.  Heart rate, SpO2 and blood pressure was continuously monitored during the procedure. Pre-oxygenation was conducted prior to intubation and endotracheal tube was placed through the vocal cords into the trachea.     The artificial airway was placed under direct visualization via glidescope route using a 7.5 ETT on the first attempt.    ETT was secured at 23cm mark.    Placement was confirmed by auscuitation of lungs with good breath sounds bilaterally and no stomach sounds.  Condensation was noted on endotracheal tube.  Pulse ox %.  CO2 detector in place with appropriate color change.   Complications: None .   Operator:  Delight Ovens NP. Dr. Ashby Dawes was present for the duration of the procedure.   Chest radiograph ordered and pending.     Morgan [name], M.D. 08/29/2017

## 2017-08-30 ENCOUNTER — Inpatient Hospital Stay: Payer: Medicare HMO

## 2017-08-30 DIAGNOSIS — J9600 Acute respiratory failure, unspecified whether with hypoxia or hypercapnia: Secondary | ICD-10-CM

## 2017-08-30 LAB — GLUCOSE, CAPILLARY
GLUCOSE-CAPILLARY: 130 mg/dL — AB (ref 65–99)
Glucose-Capillary: 134 mg/dL — ABNORMAL HIGH (ref 65–99)
Glucose-Capillary: 133 mg/dL — ABNORMAL HIGH (ref 65–99)
Glucose-Capillary: 121 mg/dL — ABNORMAL HIGH (ref 65–99)

## 2017-08-30 LAB — VANCOMYCIN, TROUGH: Vancomycin Tr: 40 ug/mL (ref 15–20)

## 2017-08-30 LAB — CBC WITH DIFFERENTIAL/PLATELET
BASOS ABS: 0 10*3/uL (ref 0–0.1)
Basophils Relative: 0 %
Eosinophils Absolute: 0 10*3/uL (ref 0–0.7)
Eosinophils Relative: 0 %
HEMATOCRIT: 25.1 % — AB (ref 35.0–47.0)
Hemoglobin: 8.1 g/dL — ABNORMAL LOW (ref 12.0–16.0)
LYMPHS PCT: 9 %
Lymphs Abs: 1.2 10*3/uL (ref 1.0–3.6)
MCH: 25.8 pg — AB (ref 26.0–34.0)
MCHC: 32.4 g/dL (ref 32.0–36.0)
MCV: 79.6 fL — AB (ref 80.0–100.0)
MONO ABS: 0.6 10*3/uL (ref 0.2–0.9)
Monocytes Relative: 4 %
Neutro Abs: 11.9 10*3/uL — ABNORMAL HIGH (ref 1.4–6.5)
Neutrophils Relative %: 87 %
Platelets: 300 10*3/uL (ref 150–440)
RBC: 3.15 MIL/uL — AB (ref 3.80–5.20)
RDW: 14.4 % (ref 11.5–14.5)
WBC: 13.9 10*3/uL — AB (ref 3.6–11.0)

## 2017-08-30 LAB — COMPREHENSIVE METABOLIC PANEL
ALBUMIN: 2.1 g/dL — AB (ref 3.5–5.0)
ALK PHOS: 91 U/L (ref 38–126)
ALT: 12 U/L — AB (ref 14–54)
AST: 21 U/L (ref 15–41)
Anion gap: 6 (ref 5–15)
BUN: 31 mg/dL — ABNORMAL HIGH (ref 6–20)
CALCIUM: 8.4 mg/dL — AB (ref 8.9–10.3)
CO2: 36 mmol/L — AB (ref 22–32)
Chloride: 94 mmol/L — ABNORMAL LOW (ref 101–111)
Creatinine, Ser: 1.16 mg/dL — ABNORMAL HIGH (ref 0.44–1.00)
GFR calc Af Amer: 56 mL/min — ABNORMAL LOW (ref >60)
GFR calc non Af Amer: 49 mL/min — ABNORMAL LOW (ref >60)
GLUCOSE: 115 mg/dL — AB (ref 65–99)
Potassium: 4.1 mmol/L (ref 3.5–5.1)
SODIUM: 136 mmol/L (ref 135–145)
Total Bilirubin: 0.4 mg/dL (ref 0.3–1.2)
Total Protein: 5.6 g/dL — ABNORMAL LOW (ref 6.5–8.1)

## 2017-08-30 LAB — LEGIONELLA PNEUMOPHILA SEROGP 1 UR AG: L. pneumophila Serogp 1 Ur Ag: NEGATIVE

## 2017-08-30 LAB — PHOSPHORUS
PHOSPHORUS: 3.7 mg/dL (ref 2.5–4.6)
Phosphorus: 2.6 mg/dL (ref 2.5–4.6)

## 2017-08-30 LAB — MAGNESIUM
Magnesium: 2.1 mg/dL (ref 1.7–2.4)
Magnesium: 2.2 mg/dL (ref 1.7–2.4)

## 2017-08-30 LAB — PROCALCITONIN: PROCALCITONIN: 0.44 ng/mL

## 2017-08-30 MED ORDER — VITAL AF 1.2 CAL PO LIQD
1000.0000 mL | ORAL | Status: DC
  Administered 2017-08-30 – 2017-09-02 (×3): 1000 mL

## 2017-08-30 MED ORDER — SENNOSIDES 8.8 MG/5ML PO SYRP
5.0000 mL | ORAL_SOLUTION | Freq: Two times a day (BID) | ORAL | Status: DC
  Administered 2017-08-30 – 2017-09-01 (×5): 5 mL
  Filled 2017-08-30 (×13): qty 5

## 2017-08-30 MED ORDER — IPRATROPIUM-ALBUTEROL 0.5-2.5 (3) MG/3ML IN SOLN
3.0000 mL | Freq: Four times a day (QID) | RESPIRATORY_TRACT | Status: DC
  Administered 2017-08-30 – 2017-09-05 (×24): 3 mL via RESPIRATORY_TRACT
  Filled 2017-08-30 (×24): qty 3

## 2017-08-30 MED ORDER — ORAL CARE MOUTH RINSE: 15.0000 mL | Freq: Four times a day (QID) | OROMUCOSAL | Status: DC

## 2017-08-30 MED ORDER — DOCUSATE SODIUM 50 MG/5ML PO LIQD
100.0000 mg | Freq: Two times a day (BID) | ORAL | Status: DC
  Administered 2017-08-30 – 2017-09-01 (×5): 100 mg
  Filled 2017-08-30 (×6): qty 10

## 2017-08-30 MED ORDER — DEXTROSE 5 % IV SOLN
1.0000 g | Freq: Two times a day (BID) | INTRAVENOUS | Status: DC
  Administered 2017-08-30 – 2017-09-01 (×4): 1 g via INTRAVENOUS
  Filled 2017-08-30 (×5): qty 1

## 2017-08-30 MED ORDER — PRO-STAT SUGAR FREE PO LIQD
30.0000 mL | Freq: Two times a day (BID) | ORAL | Status: DC
  Administered 2017-08-30: 30 mL

## 2017-08-30 MED ORDER — VITAL HIGH PROTEIN PO LIQD
1000.0000 mL | ORAL | Status: DC
  Administered 2017-08-30: 14:00:00
  Administered 2017-08-30: 1000 mL

## 2017-08-30 MED ORDER — ORAL CARE MOUTH RINSE
15.0000 mL | OROMUCOSAL | Status: DC
  Administered 2017-08-30 – 2017-09-04 (×51): 15 mL via OROMUCOSAL

## 2017-08-30 MED ORDER — FAMOTIDINE IN NACL 20-0.9 MG/50ML-% IV SOLN
20.0000 mg | Freq: Two times a day (BID) | INTRAVENOUS | Status: DC
  Administered 2017-08-30 – 2017-09-01 (×6): 20 mg via INTRAVENOUS
  Filled 2017-08-30 (×6): qty 50

## 2017-08-30 MED ORDER — SODIUM CHLORIDE 0.9 % IV SOLN
INTRAVENOUS | Status: DC
  Administered 2017-08-30 – 2017-09-02 (×2): via INTRAVENOUS

## 2017-08-30 MED ORDER — CHLORHEXIDINE GLUCONATE 0.12% ORAL RINSE (MEDLINE KIT): 15.0000 mL | Freq: Two times a day (BID) | OROMUCOSAL | Status: DC

## 2017-08-30 MED ORDER — CHLORHEXIDINE GLUCONATE 0.12% ORAL RINSE (MEDLINE KIT)
15.0000 mL | Freq: Two times a day (BID) | OROMUCOSAL | Status: DC
  Administered 2017-08-30 – 2017-09-04 (×11): 15 mL via OROMUCOSAL

## 2017-08-30 NOTE — Progress Notes (Signed)
Name: Morgan [name] MRN: [id number] DOB: 05-Dec-1951    BRIEF PATIENT DESCRIPTION:  66 yo female admitted to medsurg unit 09/10 with acute hypoxic respiratory failure secondary to multifocal pneumonia transferred to ICU 09/12 due to worsening respiratory failure requiring continuous Bipap.  Pt required mechanical intubation 09/13.  SIGNIFICANT EVENTS  09/10-Pt admitted to [facility] unit  09/12-Pt transferred to ICU  09/13-Pt mechanically intubated   STUDIES:  Echo 09/12>>EF 40%-45%  REVIEW OF SYSTEMS:  Unable to assess pt intubated   SUBJECTIVE:  Pt intubated  VITAL SIGNS: Temp:  [98.4 F (36.9 C)-100 F (37.8 C)] 100 F (37.8 C) (09/14 0800) Pulse Rate:  [66-111] 87 (09/14 0900) Resp:  [18-34] 23 (09/14 0900) BP: (74-145)/(53-100) 106/63 (09/14 0900) SpO2:  [80 %-100 %] 93 % (09/14 0900) FiO2 (%):  [50 %-100 %] 60 % (09/14 0900) Weight:  [89.6 kg (197 lb 8.5 oz)] 89.6 kg (197 lb 8.5 oz) (09/13 1400)  PHYSICAL EXAMINATION: General: acutely ill appearing Caucasian female  Neuro: sedated, follows commands, PERRL   HEENT: supple, no JVD  Cardiovascular: s1s2, RRR, no M/R/G Lungs: rhonchi throughout, even, non labored mechanically intubated  Abdomen: +BS x4, soft, obese, non tender, non distended  Musculoskeletal: normal bulk and tone, no edema  Skin: intact no rashes or lesions    Recent Labs Lab 08/28/17 0416 08/29/17 0451 08/30/17 0500  NA 137 134* 136  K 3.2* 3.4* 4.1  CL 96* 91* 94*  CO2 31 32 36*  BUN 10 19 31*  CREATININE 0.53 0.70 1.16*  GLUCOSE 144* 136* 115*    Recent Labs Lab 08/28/17 0416 08/29/17 0451 08/30/17 0454  HGB 10.4* 9.2* 8.1*  HCT 29.9* 27.7* 25.1*  WBC 20.3* 21.4* 13.9*  PLT 322 321 300   Dg Chest 1 View  Result Date: 08/29/2017 CLINICAL DATA:  Encounter for evaluation of nasogastric tube placement EXAM: CHEST 1 VIEW COMPARISON:  Portable exam 1445 hours compared to 0355 hours FINDINGS: New endotracheal tube with tip projecting  4.5 cm above carina. New nasogastric tube with tip projecting over stomach. Borderline enlargement of cardiac silhouette. Extensive BILATERAL pulmonary infiltrates, little changed. No gross pleural effusion or pneumothorax. Bones demineralized. IMPRESSION: Persistent severe diffuse BILATERAL pulmonary infiltrates. Electronically Signed   By: Lavonia Dana M.D.   On: 08/29/2017 15:02   Dg Abd 1 View  Result Date: 08/29/2017 CLINICAL DATA:  Abdominal pain EXAM: ABDOMEN - 1 VIEW COMPARISON:  01/19/2008 FINDINGS: Supine view the abdomen shows diffuse gaseous distention of large and small bowel without an overtly obstructive pattern. Lucency identified in the region of the left hemidiaphragm, nonspecific but likely related to left lower lobe disease better seen on the upright portable chest x-ray obtained at the same time and reported separately. Multiple phleboliths are seen over the inferior anatomic pelvis. There may be a stone in the lower pole the right kidney. Visualized bony anatomy is unremarkable. IMPRESSION: Diffuse gaseous bowel distention without overt obstruction by x-ray. Electronically Signed   By: Misty Stanley M.D.   On: 08/29/2017 10:17   Dg Chest Port 1 View  Result Date: 08/30/2017 CLINICAL DATA:  66 y/o  F; acute respiratory failure. EXAM: PORTABLE CHEST 1 VIEW COMPARISON:  08/29/2017 chest radiograph FINDINGS: Stable cardiac silhouette. Stable endotracheal tube 3.5 cm from carina. Stable right PICC line tip projecting over lower SVC. Enteric tube tip below the field of view and abdomen. Stable diffuse airspace consolidations. No acute osseous abnormality is evident. IMPRESSION: Stable lines and tubes.  Stable  diffuse airspace consolidations. Electronically Signed   By: Kristine Garbe M.D.   On: 08/30/2017 05:11   Dg Chest Port 1 View  Result Date: 08/29/2017 CLINICAL DATA:  Confirm line placement. EXAM: PORTABLE CHEST 1 VIEW COMPARISON:  Portable chest x-ray of earlier today.  FINDINGS: The patient has undergone placement of a right-sided PICC line. The tip projects over the midportion of the SVC. The endotracheal tube tip projects approximately 3.6 cm above the carina. The esophagogastric tube tip projects below the inferior margin of the image. The lungs are well-expanded. The interstitial markings remain increased with areas of patchy alveolar opacity greatest at the left base. The heart is top-normal in size. The pulmonary vascularity is not clearly engorged. IMPRESSION: Interval placement of right-sided PICC line with the tip in the region of the midportion of the SVC. The other support tubes are in reasonable position. Persistent bilateral interstitial and alveolar infiltrates compatible with pneumonia. Electronically Signed   By: David  Martinique M.D.   On: 08/29/2017 16:51   Dg Chest Port 1 View  Result Date: 08/29/2017 CLINICAL DATA:  Respiratory failure. EXAM: PORTABLE CHEST 1 VIEW COMPARISON:  08/28/2017. FINDINGS: Heart size is stable. Diffuse bilateral severe airspace disease again noted with interim progression from prior exam. Small left pleural effusion. No pneumothorax. IMPRESSION: Diffuse severe bilateral airspace disease with progression from prior exam. Small left pleural effusion. Electronically Signed   By: Marcello Moores  Register   On: 08/29/2017 06:12   Dg Chest Port 1 View  Result Date: 08/28/2017 CLINICAL DATA:  Acute respiratory failure. EXAM: PORTABLE CHEST 1 VIEW COMPARISON:  Radiograph of August 27, 2017. FINDINGS: Stable cardiomediastinal silhouette. No pneumothorax is noted. No significant pleural effusion is noted. Stable bilateral airspace opacities are noted in the upper lobes most consistent with pneumonia or edema. Bony thorax is unremarkable. IMPRESSION: Stable bilateral lung opacities are noted concerning for pneumonia. Electronically Signed   By: Marijo Conception, M.D.   On: 08/28/2017 13:38    ASSESSMENT / PLAN: Acute hypoxic respiratory  failure secondary to multifocal pneumonia and with component of ARDS  Mechanical Intubation Diarrhea  Acute renal failure  Hypokalemia-resolved   Hypomagnesemia-resolved   Anemia without acute blood loss  Hx: Former Smoker, HTN, and Neuromuscular Disorder    P: Full vent support wean as tolerated Maintain O2 sats >92% VAP Bundle  Scheduled and prn bronchodilator therapy  Continue IV and nebulized steroids  Repeat CXR in am Trend WBC and monitor fever curve Trend PCT Follow cultures  Trend BMP Replace electrolytes as indicated Monitor UOP  Lovenox for VTE prophylaxis  Trend CBC  Maintain RASS goal 0 to -1 Propofol and Fentanyl gtts to maintain RASS goal Prn IV Fentanyl for pain management  WUA daily  TF protocol initiated-09/14 IV Pepcid for PUD prophylaxis   Marda Stalker, [place] Pager [phone] (please enter 7 digits) PCCM Consult Pager [phone] (please enter 7 digits)  Pulmonary/critical care attending  I have personally seen and examined. Mrs. Ivey with his practitioner. I have reviewed revise and confirm note. I have individually performed exam, reviewed laboratory studies and imaging studies.patient with hypoxemic respiratory failure multi lobar pneumonia. We'll continue on mechanical ventilation, continue antimicrobial coverage, checking respiratory culture and blood culture. If patient has improved by tomorrow will start spontaneous awakening and breathing trials  Hermelinda Dellen, D.O.

## 2017-08-30 NOTE — Progress Notes (Signed)
Pharmacy Antibiotic Note  Morgan [name] is a 66 y.o. female admitted on 08/26/2017 with pneumonia.  Pharmacy has been consulted for vanc/cefepime dosing. Azithromycin added 9/12.   Plan: Will discontinue vancomycin, check a random vancomycin level tomorrow AM, and reassess dosing from there.   Will decrease cefepime to 1g IV q 12 hours.    Height: 6\' 1"  (185.4 cm) Weight: 197 lb 8.5 oz (89.6 kg) IBW/kg (Calculated) : 75.4  Temp (24hrs), Avg:99.8 F (37.7 C), Min:98.9 F (37.2 C), Max:100.8 F (38.2 C)   Recent Labs Lab 08/26/17 1700 08/26/17 2148 08/27/17 0405 08/27/17 1903 08/28/17 0416 08/29/17 0451 08/30/17 0454 08/30/17 0500 08/30/17 1738  WBC 15.3*  --  15.7*  --  20.3* 21.4* 13.9*  --   --   CREATININE 0.82  --  0.79  --  0.53 0.70  --  1.16*  --   LATICACIDVEN  --  1.9  --   --   --   --   --   --   --   VANCOTROUGH  --   --   --  18  --   --   --   --  40*    Estimated Creatinine Clearance: 58.3 mL/min (A) (by C-G formula based on SCr of 1.16 mg/dL (H)).    Allergies  Allergen Reactions  . Demerol [Meperidine]     Hives   . Strawberry (Diagnostic) Hives   ABX: Vancomycin 9/10 >> 9/12, 9/13 >> Cefepime 9/10 >> Azithromycin 9/12 >>  Micro: 9/13 SCx: penidng 9/12 MRSA PCR: negative 9/10 BCx: NGTD 9/10 C diff: negative  Thank you for allowing pharmacy to be a part of this patient's care.  Napoleon Form, PharmD, BCPS Clinical Pharmacist 08/30/2017 6:51 PM

## 2017-08-30 NOTE — Progress Notes (Addendum)
Nutrition Follow-up  DOCUMENTATION CODES:   Not applicable  INTERVENTION:  Continue Vital 1.2 @ 40mL/hr, increase by 10 every 12 hours to 55mL/hr At goal, provides 1584 calories, 99 grams of protein, and 1071mL free water  With propofol at 14.2mL/hr (375 calories), provides 1959 calories (98.7% estimated needs)  NUTRITION DIAGNOSIS:   Inadequate oral intake related to inability to eat (pt sedated on vent) as evidenced by NPO status. -ongoing  GOAL:   Provide needs based on ASPEN/SCCM guidelines -will meet  MONITOR:   Vent status, Labs, Weight trends, I & O's, TF tolerance  REASON FOR ASSESSMENT:   Consult Enteral/tube feeding initiation and management  ASSESSMENT:   66 yo female with PMH of Former Smoker, Neuromuscular Disorder, HTN, Depression, Arthritis, Anxiety, Anemia, and Allergies.  She presented to [facility] ER 09/10 with c/o fever, cough, malaise, and myalgias onset of symptoms 3 days prior to presentation to ER.  She also states she's had diarrhea for the last 3 months on average 6 stools daily.  In the ER CXR revealed multifocal pneumonia and she met sepsis criteria, therefore iv abx were initiated.  She was subsequently admitted to the medsurg unit by hospitalist team for further workup and treatment.  On 09/12 she developed worsening hypoxia, therefore she was placed on a non rebreather mask with O2 sats remaining in the 80's.  She was subsequently placed on continuous Bipap and transferred to ICU 09/12  Patient is currently intubated on ventilator support MV: 10.8 L/min Temp (24hrs), Avg:99.4 F (37.4 C), Min:98.4 F (36.9 C), Max:100.4 F (38 C) Propofol: 14.2 ml/hr --> 375 calories  Discussed with charge nurse and patient's RN  RD consulted to begin tubefeeding yesterday, Vital 1.2 was ordered and hanging. Tube feed protocol was entered again today, as a result Vital 1.2 was ordered along with Vital High Protein and Pro-stat 30mL BID Vital 1.2 was running at  40 this morning, now Vital High Protein at 40.   RD discontinued order for Vital High Protein, Pro-stat 30mL BID.  Correct order should be Vital 1.2 at 40mL/hr, increase by 10 every 12 hours to goal rate of 55mL/hr  Needs re-estimated, tf regimen adjusted for propofol.   Labs reviewed:  BNP 2321 CBGs 133, 134, 118  Medications reviewed and include:  Senokot 5mL BID, Colace 100mg BID Famotidine gtt, Fentanyl gtt  Diet Order:     Skin:  Reviewed, no issues  Last BM:  9/10  Height:   Ht Readings from Last 1 Encounters:  08/26/17 6' 1" (1.854 m)    Weight:   Wt Readings from Last 1 Encounters:  08/29/17 197 lb 8.5 oz (89.6 kg)    Ideal Body Weight:  75 kg  BMI:  Body mass index is 26.06 kg/m.  Estimated Nutritional Needs:   Kcal:  1983 calories (MSJ 1558, Ve 10.8, TMax 100.4)  Protein:  97-113g/day   Fluid:  per MD  EDUCATION NEEDS:   Education needs no appropriate at this time   M. , MS, RD LDN Inpatient Clinical Dietitian Pager [phone]  

## 2017-08-30 NOTE — Progress Notes (Signed)
[place] [facility] [place] NAME: Morgan [name]    MR#:  [id number]  DATE OF BIRTH:  1951/11/07  SUBJECTIVE:  CHIEF COMPLAINT:  Was on NRBM , so transferred to ICU, remained on cont bipap, still have worsening in her Xray chest, so decided to intubate 08/29/17.  REVIEW OF SYSTEMS:   Could not provide due to respi failure and intubation.   DRUG ALLERGIES:   Allergies  Allergen Reactions  . Demerol [Meperidine]     Hives   . Strawberry (Diagnostic) Hives    VITALS:  Blood pressure 116/70, pulse 82, temperature (!) 100.8 F (38.2 C), temperature source Oral, resp. rate (!) 22, height 6\' 1"  (1.854 m), weight 89.6 kg (197 lb 8.5 oz), SpO2 91 %.  PHYSICAL EXAMINATION:  GENERAL:  66 y.o.-year-old patient lying in the bed with no acute distress.  EYES: Pupils equal, round, reactive to light . No scleral icterus. Marland Kitchen  HEENT: Head atraumatic, normocephalic. Oropharynx and nasopharynx clear.  NECK:  Supple, no jugular venous distention. No thyroid enlargement, no tenderness.  LUNGS: Moderate breath sounds bilaterally, positive extensive crackles no wheezing,   no for use of accessory muscles of respiration. On vent support, sedated. CARDIOVASCULAR: S1, S2 normal. No murmurs, rubs, or gallops.  ABDOMEN: Soft, nontender, nondistended. Bowel sounds present. No organomegaly or mass.  EXTREMITIES: No pedal edema, cyanosis, or clubbing.  NEUROLOGIC: sedated on ventilator support. PSYCHIATRIC: The patient is sedated.  SKIN: No obvious rash, lesion, or ulcer.      LABORATORY PANEL:   CBC  Recent Labs Lab 08/30/17 0454  WBC 13.9*  HGB 8.1*  HCT 25.1*  PLT 300   ------------------------------------------------------------------------------------------------------------------  Chemistries   Recent Labs Lab 08/30/17 0500 08/30/17 1738  NA 136  --   K 4.1  --   CL 94*  --   CO2 36*  --   GLUCOSE 115*  --   BUN 31*  --   CREATININE 1.16*   --   CALCIUM 8.4*  --   MG  --  2.2  AST 21  --   ALT 12*  --   ALKPHOS 91  --   BILITOT 0.4  --    ------------------------------------------------------------------------------------------------------------------  Cardiac Enzymes  Recent Labs Lab 08/29/17 0451  TROPONINI 0.67*   ------------------------------------------------------------------------------------------------------------------  RADIOLOGY:  Dg Chest 1 View  Result Date: 08/29/2017 CLINICAL DATA:  Encounter for evaluation of nasogastric tube placement EXAM: CHEST 1 VIEW COMPARISON:  Portable exam 1445 hours compared to 0355 hours FINDINGS: New endotracheal tube with tip projecting 4.5 cm above carina. New nasogastric tube with tip projecting over stomach. Borderline enlargement of cardiac silhouette. Extensive BILATERAL pulmonary infiltrates, little changed. No gross pleural effusion or pneumothorax. Bones demineralized. IMPRESSION: Persistent severe diffuse BILATERAL pulmonary infiltrates. Electronically Signed   By: Lavonia Dana M.D.   On: 08/29/2017 15:02   Dg Abd 1 View  Result Date: 08/29/2017 CLINICAL DATA:  Abdominal pain EXAM: ABDOMEN - 1 VIEW COMPARISON:  01/19/2008 FINDINGS: Supine view the abdomen shows diffuse gaseous distention of large and small bowel without an overtly obstructive pattern. Lucency identified in the region of the left hemidiaphragm, nonspecific but likely related to left lower lobe disease better seen on the upright portable chest x-ray obtained at the same time and reported separately. Multiple phleboliths are seen over the inferior anatomic pelvis. There may be a stone in the lower pole the right kidney. Visualized bony anatomy is unremarkable. IMPRESSION: Diffuse gaseous bowel distention  without overt obstruction by x-ray. Electronically Signed   By: Misty Stanley M.D.   On: 08/29/2017 10:17   Dg Chest Port 1 View  Result Date: 08/30/2017 CLINICAL DATA:  66 y/o  F; acute respiratory  failure. EXAM: PORTABLE CHEST 1 VIEW COMPARISON:  08/29/2017 chest radiograph FINDINGS: Stable cardiac silhouette. Stable endotracheal tube 3.5 cm from carina. Stable right PICC line tip projecting over lower SVC. Enteric tube tip below the field of view and abdomen. Stable diffuse airspace consolidations. No acute osseous abnormality is evident. IMPRESSION: Stable lines and tubes.  Stable diffuse airspace consolidations. Electronically Signed   By: Kristine Garbe M.D.   On: 08/30/2017 05:11   Dg Chest Port 1 View  Result Date: 08/29/2017 CLINICAL DATA:  Confirm line placement. EXAM: PORTABLE CHEST 1 VIEW COMPARISON:  Portable chest x-ray of earlier today. FINDINGS: The patient has undergone placement of a right-sided PICC line. The tip projects over the midportion of the SVC. The endotracheal tube tip projects approximately 3.6 cm above the carina. The esophagogastric tube tip projects below the inferior margin of the image. The lungs are well-expanded. The interstitial markings remain increased with areas of patchy alveolar opacity greatest at the left base. The heart is top-normal in size. The pulmonary vascularity is not clearly engorged. IMPRESSION: Interval placement of right-sided PICC line with the tip in the region of the midportion of the SVC. The other support tubes are in reasonable position. Persistent bilateral interstitial and alveolar infiltrates compatible with pneumonia. Electronically Signed   By: David  Martinique M.D.   On: 08/29/2017 16:51   Dg Chest Port 1 View  Result Date: 08/29/2017 CLINICAL DATA:  Respiratory failure. EXAM: PORTABLE CHEST 1 VIEW COMPARISON:  08/28/2017. FINDINGS: Heart size is stable. Diffuse bilateral severe airspace disease again noted with interim progression from prior exam. Small left pleural effusion. No pneumothorax. IMPRESSION: Diffuse severe bilateral airspace disease with progression from prior exam. Small left pleural effusion. Electronically  Signed   By: Marcello Moores  Register   On: 08/29/2017 06:12    EKG:   Orders placed or performed during the hospital encounter of 08/26/17  . EKG 12-Lead  . EKG 12-Lead  . EKG 12-Lead  . EKG 12-Lead    ASSESSMENT AND PLAN:   * Sepsis ([place]) -Secondary to healthcare associated pneumonia, multilobar pneumonia  IV antibiotics cefepime and vanc, added azithromycin. Lactic acid normal at 1.9 Follow-up on the blood cultures are pending Sputum cx are not sent.  Worsening respi failure - transferred to ICU. Remained on Cont Bipap, still worsening, Intubation.   Appreciated help by pulm.  # hypokalemia replete and recheck   # Migraine headache Excedrin Migraine as needed   #  Essential hypertension - continue home med amlodipine     #depression and anxiety continue home medication Prozac      All the records are reviewed and case discussed with Care Management/Social Workerr. Management plans discussed with the patient, family and they are in agreement.  CODE STATUS: fc   TOTAL TIME TAKING CARE OF THIS PATIENT: 35  minutes.   POSSIBLE D/C IN 2-3  DAYS, DEPENDING ON CLINICAL CONDITION.  Note: This dictation was prepared with Dragon dictation along with smaller phrase technology. Any transcriptional errors that result from this process are unintentional.   Vaughan Basta M.D on 08/30/2017 at 6:25 PM  Between 7am to 6pm - Pager - [phone] After 6pm go to [email or web address] - password [email or web address] [facility]  [place] [facility]  Office  [phone]  CC:  Primary care physician; [name], [name], MD

## 2017-08-31 ENCOUNTER — Inpatient Hospital Stay: Payer: Medicare HMO

## 2017-08-31 LAB — CBC WITH DIFFERENTIAL/PLATELET
Basophils Absolute: 0 10*3/uL (ref 0–0.1)
Basophils Relative: 0 %
EOS PCT: 2 %
Eosinophils Absolute: 0.2 10*3/uL (ref 0–0.7)
HEMATOCRIT: 23.8 % — AB (ref 35.0–47.0)
Hemoglobin: 7.9 g/dL — ABNORMAL LOW (ref 12.0–16.0)
LYMPHS ABS: 1 10*3/uL (ref 1.0–3.6)
LYMPHS PCT: 10 %
MCH: 26.8 pg (ref 26.0–34.0)
MCHC: 33.1 g/dL (ref 32.0–36.0)
MCV: 80.9 fL (ref 80.0–100.0)
Monocytes Absolute: 0.4 10*3/uL (ref 0.2–0.9)
Monocytes Relative: 4 %
NEUTROS ABS: 8.4 10*3/uL — AB (ref 1.4–6.5)
Neutrophils Relative %: 84 %
PLATELETS: 246 10*3/uL (ref 150–440)
RBC: 2.94 MIL/uL — AB (ref 3.80–5.20)
RDW: 14.7 % — ABNORMAL HIGH (ref 11.5–14.5)
WBC: 10 10*3/uL (ref 3.6–11.0)

## 2017-08-31 LAB — MAGNESIUM
MAGNESIUM: 2.2 mg/dL (ref 1.7–2.4)
MAGNESIUM: 2.3 mg/dL (ref 1.7–2.4)

## 2017-08-31 LAB — CULTURE, BLOOD (ROUTINE X 2)
Culture: NO GROWTH
Culture: NO GROWTH
Special Requests: ADEQUATE

## 2017-08-31 LAB — BASIC METABOLIC PANEL
ANION GAP: 9 (ref 5–15)
BUN: 45 mg/dL — AB (ref 6–20)
CO2: 33 mmol/L — AB (ref 22–32)
Calcium: 8 mg/dL — ABNORMAL LOW (ref 8.9–10.3)
Chloride: 93 mmol/L — ABNORMAL LOW (ref 101–111)
Creatinine, Ser: 1.68 mg/dL — ABNORMAL HIGH (ref 0.44–1.00)
GFR calc Af Amer: 36 mL/min — ABNORMAL LOW (ref >60)
GFR calc non Af Amer: 31 mL/min — ABNORMAL LOW (ref >60)
GLUCOSE: 136 mg/dL — AB (ref 65–99)
POTASSIUM: 3.8 mmol/L (ref 3.5–5.1)
Sodium: 135 mmol/L (ref 135–145)

## 2017-08-31 LAB — GLUCOSE, CAPILLARY
GLUCOSE-CAPILLARY: 126 mg/dL — AB (ref 65–99)
GLUCOSE-CAPILLARY: 122 mg/dL — AB (ref 65–99)
GLUCOSE-CAPILLARY: 153 mg/dL — AB (ref 65–99)
Glucose-Capillary: 135 mg/dL — ABNORMAL HIGH (ref 65–99)
Glucose-Capillary: 137 mg/dL — ABNORMAL HIGH (ref 65–99)
Glucose-Capillary: 156 mg/dL — ABNORMAL HIGH (ref 65–99)

## 2017-08-31 LAB — PHOSPHORUS
PHOSPHORUS: 4 mg/dL (ref 2.5–4.6)
Phosphorus: 4.5 mg/dL (ref 2.5–4.6)

## 2017-08-31 LAB — VANCOMYCIN, RANDOM: Vancomycin Rm: 29

## 2017-08-31 NOTE — Progress Notes (Signed)
Patient has been asleep most of the shift. Can open eyes and follow commands. Has safety mitts on for reassurance. Pt tends to move hands toward ETT tube when she is suctioned. Continue to monitor.

## 2017-08-31 NOTE — Progress Notes (Addendum)
Name: Morgan [name] MRN: [id number] DOB: [birth year]    BRIEF PATIENT DESCRIPTION:  66 yo female admitted to medsurg unit 09/10 with acute hypoxic respiratory failure secondary to multifocal pneumonia transferred to ICU 09/12 due to worsening respiratory failure requiring continuous Bipap.  Pt required mechanical intubation 09/13.  SIGNIFICANT EVENTS  09/10-Pt admitted to [facility] unit  09/12-Pt transferred to ICU  09/13-Pt mechanically intubated   STUDIES:  Echo 09/12>>EF 40%-45%  REVIEW OF SYSTEMS:  Unable to assess pt intubated   SUBJECTIVE:  Pt intubated  VITAL SIGNS: Temp:  [97.8 F (36.6 C)-100.8 F (38.2 C)] 98.7 F (37.1 C) (09/15 0800) Pulse Rate:  [64-87] 79 (09/15 0800) Resp:  [17-23] 20 (09/15 0800) BP: (81-116)/(51-70) 107/58 (09/15 0800) SpO2:  [91 %-99 %] 93 % (09/15 0800) FiO2 (%):  [40 %-60 %] 40 % (09/15 0742) Weight:  [97.2 kg (214 lb 4.6 oz)] 97.2 kg (214 lb 4.6 oz) (09/15 0500)  PHYSICAL EXAMINATION: General: acutely ill appearing Caucasian female  Neuro: sedated, follows commands, PERRL   HEENT: supple, no JVD  Cardiovascular: s1s2, RRR, no M/R/G Lungs: rhonchi throughout, even, non labored mechanically intubated  Abdomen: +BS x4, soft, obese, non tender, non distended  Musculoskeletal: normal bulk and tone, no edema  Skin: intact no rashes or lesions    Recent Labs Lab 08/29/17 0451 08/30/17 0500 08/31/17 0500  NA 134* 136 135  K 3.4* 4.1 3.8  CL 91* 94* 93*  CO2 32 36* 33*  BUN 19 31* 45*  CREATININE 0.70 1.16* 1.68*  GLUCOSE 136* 115* 136*    Recent Labs Lab 08/29/17 0451 08/30/17 0454 08/31/17 0500  HGB 9.2* 8.1* 7.9*  HCT 27.7* 25.1* 23.8*  WBC 21.4* 13.9* 10.0  PLT 321 300 246   Dg Chest 1 View  Result Date: 08/29/2017 CLINICAL DATA:  Encounter for evaluation of nasogastric tube placement EXAM: CHEST 1 VIEW COMPARISON:  Portable exam 1445 hours compared to 0355 hours FINDINGS: New endotracheal tube with tip projecting  4.5 cm above carina. New nasogastric tube with tip projecting over stomach. Borderline enlargement of cardiac silhouette. Extensive BILATERAL pulmonary infiltrates, little changed. No gross pleural effusion or pneumothorax. Bones demineralized. IMPRESSION: Persistent severe diffuse BILATERAL pulmonary infiltrates. Electronically Signed   By: Lavonia Dana M.D.   On: 08/29/2017 15:02   Dg Abd 1 View  Result Date: 08/29/2017 CLINICAL DATA:  Abdominal pain EXAM: ABDOMEN - 1 VIEW COMPARISON:  01/19/2008 FINDINGS: Supine view the abdomen shows diffuse gaseous distention of large and small bowel without an overtly obstructive pattern. Lucency identified in the region of the left hemidiaphragm, nonspecific but likely related to left lower lobe disease better seen on the upright portable chest x-ray obtained at the same time and reported separately. Multiple phleboliths are seen over the inferior anatomic pelvis. There may be a stone in the lower pole the right kidney. Visualized bony anatomy is unremarkable. IMPRESSION: Diffuse gaseous bowel distention without overt obstruction by x-ray. Electronically Signed   By: Misty Stanley M.D.   On: 08/29/2017 10:17   Dg Chest Port 1 View  Result Date: 08/31/2017 CLINICAL DATA:  Acute respiratory failure. EXAM: PORTABLE CHEST 1 VIEW COMPARISON:  08/30/2017 and previous FINDINGS: Endotracheal tube tip is 6 cm above the carina. Nasogastric tube enters the abdomen. Right arm PICC tip is at the SVC RA junction. Patchy bilateral airspace density persists, but is improved in the lower lungs with better aeration. No worsening or new finding. IMPRESSION: Slight radiographic improvement with better  aeration in the lower lungs. Electronically Signed   By: Nelson Chimes M.D.   On: 08/31/2017 06:56   Dg Chest Port 1 View  Result Date: 08/30/2017 CLINICAL DATA:  66 y/o  F; acute respiratory failure. EXAM: PORTABLE CHEST 1 VIEW COMPARISON:  08/29/2017 chest radiograph FINDINGS: Stable  cardiac silhouette. Stable endotracheal tube 3.5 cm from carina. Stable right PICC line tip projecting over lower SVC. Enteric tube tip below the field of view and abdomen. Stable diffuse airspace consolidations. No acute osseous abnormality is evident. IMPRESSION: Stable lines and tubes.  Stable diffuse airspace consolidations. Electronically Signed   By: Kristine Garbe M.D.   On: 08/30/2017 05:11   Dg Chest Port 1 View  Result Date: 08/29/2017 CLINICAL DATA:  Confirm line placement. EXAM: PORTABLE CHEST 1 VIEW COMPARISON:  Portable chest x-ray of earlier today. FINDINGS: The patient has undergone placement of a right-sided PICC line. The tip projects over the midportion of the SVC. The endotracheal tube tip projects approximately 3.6 cm above the carina. The esophagogastric tube tip projects below the inferior margin of the image. The lungs are well-expanded. The interstitial markings remain increased with areas of patchy alveolar opacity greatest at the left base. The heart is top-normal in size. The pulmonary vascularity is not clearly engorged. IMPRESSION: Interval placement of right-sided PICC line with the tip in the region of the midportion of the SVC. The other support tubes are in reasonable position. Persistent bilateral interstitial and alveolar infiltrates compatible with pneumonia. Electronically Signed   By: David  Martinique M.D.   On: 08/29/2017 16:51    ASSESSMENT / PLAN: Acute hypoxic respiratory failure secondary to multifocal pneumonia and with component of ARDS. Patient has had some chest x-ray improvement. Has weaned down on FiO2. We'll attempt spontaneous awakening and breathing trial with post trial blood gas.   Acute renal failure. Has worsened. Will Monitor closely, no acute indication for hemodialysis. If increases again will check renal ultrasound   Anemia without acute blood loss Stable hemodynamics off of pressors  Critical care time 40 minutes, respiratory  failure, sepsis, pneumonia, renal insufficiency  Hermelinda Dellen, D.O.

## 2017-08-31 NOTE — Progress Notes (Signed)
[name]: pt tolerated about 2 hours on half sedation, then RR increased to 30's and sats dropped to 89.  Resumed full sedation dosage at Propofol @10mcg  and Fentanyl @50mcg .  Pt responded immediately and became more relaxed and breathing with the vent.  Morgan [name]

## 2017-08-31 NOTE — Progress Notes (Signed)
[place] [facility] [place] NAME: Morgan [name]    MR#:  [id number]  DATE OF BIRTH:  01-05-1951  SUBJECTIVE: Tolerated  Weaning trials only 2 hours. After that patient became tachypneic.now on  Full vent support.    CHIEF COMPLAINT:  Was on NRBM , so transferred to ICU, remained on cont bipap, still have worsening in her Xray chest, so decided to intubate 08/29/17.  REVIEW OF SYSTEMS:   Could not provide due to respi failure and intubation.   DRUG ALLERGIES:   Allergies  Allergen Reactions  . Demerol [Meperidine]     Hives   . Strawberry (Diagnostic) Hives    VITALS:  Blood pressure 113/66, pulse 79, temperature 98.7 F (37.1 C), resp. rate (!) 21, height 6\' 1"  (1.854 m), weight 97.2 kg (214 lb 4.6 oz), SpO2 95 %.  PHYSICAL EXAMINATION:  GENERAL:  66 y.o.-year-old patient lying in the bed with no acute distress.  EYES: Pupils equal, round, reactive to light . No scleral icterus. Marland Kitchen  HEENT: Head atraumatic, normocephalic. Oropharynx and nasopharynx clear.  NECK:  Supple, no jugular venous distention. No thyroid enlargement, no tenderness.  LUNGS: Moderate breath sounds bilaterally, positive extensive crackles no wheezing,   no for use of accessory muscles of respiration. On vent support, sedated. CARDIOVASCULAR: S1, S2 normal. No murmurs, rubs, or gallops.  ABDOMEN: Soft, nontender, nondistended. Bowel sounds present. No organomegaly or mass.  EXTREMITIES: No pedal edema, cyanosis, or clubbing.  NEUROLOGIC: sedated on ventilator support. PSYCHIATRIC: The patient is sedated.  SKIN: No obvious rash, lesion, or ulcer.      LABORATORY PANEL:   CBC  Recent Labs Lab 08/31/17 0500  WBC 10.0  HGB 7.9*  HCT 23.8*  PLT 246   ------------------------------------------------------------------------------------------------------------------  Chemistries   Recent Labs Lab 08/30/17 0500  08/31/17 0500  NA 136  --  135  K 4.1  --   3.8  CL 94*  --  93*  CO2 36*  --  33*  GLUCOSE 115*  --  136*  BUN 31*  --  45*  CREATININE 1.16*  --  1.68*  CALCIUM 8.4*  --  8.0*  MG  --   < > 2.2  AST 21  --   --   ALT 12*  --   --   ALKPHOS 91  --   --   BILITOT 0.4  --   --   < > = values in this interval not displayed. ------------------------------------------------------------------------------------------------------------------  Cardiac Enzymes  Recent Labs Lab 08/29/17 0451  TROPONINI 0.67*   ------------------------------------------------------------------------------------------------------------------  RADIOLOGY:  Dg Chest 1 View  Result Date: 08/29/2017 CLINICAL DATA:  Encounter for evaluation of nasogastric tube placement EXAM: CHEST 1 VIEW COMPARISON:  Portable exam 1445 hours compared to 0355 hours FINDINGS: New endotracheal tube with tip projecting 4.5 cm above carina. New nasogastric tube with tip projecting over stomach. Borderline enlargement of cardiac silhouette. Extensive BILATERAL pulmonary infiltrates, little changed. No gross pleural effusion or pneumothorax. Bones demineralized. IMPRESSION: Persistent severe diffuse BILATERAL pulmonary infiltrates. Electronically Signed   By: Lavonia Dana M.D.   On: 08/29/2017 15:02   Dg Chest Port 1 View  Result Date: 08/31/2017 CLINICAL DATA:  Acute respiratory failure. EXAM: PORTABLE CHEST 1 VIEW COMPARISON:  08/30/2017 and previous FINDINGS: Endotracheal tube tip is 6 cm above the carina. Nasogastric tube enters the abdomen. Right arm PICC tip is at the SVC RA junction. Patchy bilateral airspace density persists, but is improved  in the lower lungs with better aeration. No worsening or new finding. IMPRESSION: Slight radiographic improvement with better aeration in the lower lungs. Electronically Signed   By: Nelson Chimes M.D.   On: 08/31/2017 06:56   Dg Chest Port 1 View  Result Date: 08/30/2017 CLINICAL DATA:  66 y/o  F; acute respiratory failure. EXAM:  PORTABLE CHEST 1 VIEW COMPARISON:  08/29/2017 chest radiograph FINDINGS: Stable cardiac silhouette. Stable endotracheal tube 3.5 cm from carina. Stable right PICC line tip projecting over lower SVC. Enteric tube tip below the field of view and abdomen. Stable diffuse airspace consolidations. No acute osseous abnormality is evident. IMPRESSION: Stable lines and tubes.  Stable diffuse airspace consolidations. Electronically Signed   By: Kristine Garbe M.D.   On: 08/30/2017 05:11   Dg Chest Port 1 View  Result Date: 08/29/2017 CLINICAL DATA:  Confirm line placement. EXAM: PORTABLE CHEST 1 VIEW COMPARISON:  Portable chest x-ray of earlier today. FINDINGS: The patient has undergone placement of a right-sided PICC line. The tip projects over the midportion of the SVC. The endotracheal tube tip projects approximately 3.6 cm above the carina. The esophagogastric tube tip projects below the inferior margin of the image. The lungs are well-expanded. The interstitial markings remain increased with areas of patchy alveolar opacity greatest at the left base. The heart is top-normal in size. The pulmonary vascularity is not clearly engorged. IMPRESSION: Interval placement of right-sided PICC line with the tip in the region of the midportion of the SVC. The other support tubes are in reasonable position. Persistent bilateral interstitial and alveolar infiltrates compatible with pneumonia. Electronically Signed   By: David  Martinique M.D.   On: 08/29/2017 16:51    EKG:   Orders placed or performed during the hospital encounter of 08/26/17  . EKG 12-Lead  . EKG 12-Lead  . EKG 12-Lead  . EKG 12-Lead    ASSESSMENT AND PLAN:   * Sepsis ([place]) -Secondary to healthcare associated pneumonia, multilobar pneumonia  IV antibiotics cefepime and vanc, added azithromycin. Lactic acid normal at 1.9 bloodCultures negative. Sputum cx are not sent.  Acute respiratory failure due to multifocal pneumonia. Status post  intubation, continue full ventilator, bronchodilators, Pulmicort nebulizers. Continue IV antibiotics. Wean as tolerated. Elevated pro-calcitonin levels up to 0.44.  hypokalemia replete and recheck     #  Essential hypertension - continue home med amlodipine     acute kidney injury secondary to sepsis. Continue IV fluids, monitoring urine output, avoid nephrotoxic agents, check daily daily function.  All the records are reviewed and case discussed with Care Management/Social Workerr. Management plans discussed with the patient, family and they are in agreement.  CODE STATUS: fc   TOTAL TIME TAKING CARE OF THIS PATIENT: 35  minutes.   POSSIBLE D/C IN 2-3  DAYS, DEPENDING ON CLINICAL CONDITION.  Note: This dictation was prepared with Dragon dictation along with smaller phrase technology. Any transcriptional errors that result from this process are unintentional.   Birney Belshe M.D on 08/31/2017 at 12:48 PM  Between 7am to 6pm - Pager - [phone] After 6pm go to [email or web address] - password [email or web address] [facility]  Office  [phone]  CC: Primary care physician; Javier Docker, MD

## 2017-08-31 NOTE — Progress Notes (Signed)
Pharmacy Antibiotic Note  Morgan [name] is a 66 y.o. female admitted on 08/26/2017 with pneumonia.  Pharmacy has been consulted for vanc/cefepime dosing. Azithromycin added 9/12.   Plan: Random level ~24 hours post dose still 29. Will recheck Vancomycin random level with am labs.   Will continue cefepime to 1g IV q 12 hours.    Height: 6\' 1"  (185.4 cm) Weight: 214 lb 4.6 oz (97.2 kg) IBW/kg (Calculated) : 75.4  Temp (24hrs), Avg:99 F (37.2 C), Min:97.8 F (36.6 C), Max:100.8 F (38.2 C)   Recent Labs Lab 08/26/17 2148 08/27/17 0405 08/27/17 1903 08/28/17 0416 08/29/17 0451 08/30/17 0454 08/30/17 0500 08/30/17 1738 08/31/17 0500 08/31/17 0849  WBC  --  15.7*  --  20.3* 21.4* 13.9*  --   --  10.0  --   CREATININE  --  0.79  --  0.53 0.70  --  1.16*  --  1.68*  --   LATICACIDVEN 1.9  --   --   --   --   --   --   --   --   --   VANCOTROUGH  --   --  18  --   --   --   --  40*  --   --   VANCORANDOM  --   --   --   --   --   --   --   --   --  29    Estimated Creatinine Clearance: 44.9 mL/min (A) (by C-G formula based on SCr of 1.68 mg/dL (H)).    Allergies  Allergen Reactions  . Demerol [Meperidine]     Hives   . Strawberry (Diagnostic) Hives   ABX: Vancomycin 9/10 >> 9/12, 9/13 >> Cefepime 9/10 >> Azithromycin 9/12 >>  Micro: 9/13 SCx: penidng 9/12 MRSA PCR: negative 9/10 BCx: NGTD 9/10 C diff: negative  Thank you for allowing pharmacy to be a part of this patient's care.  Larene Beach, PharmD, BCPS Clinical Pharmacist 08/31/2017 10:18 AM

## 2017-08-31 NOTE — Progress Notes (Signed)
Turned and repositioned pt in bed.  Coughing stimulated with movement and she became restless.  Bolused 28mcg Fentanyl and stayed with pt.  Sats down to 88 and never fully recovered back to 90's.  Dr. Jefferson Fuel by to see and neb/blood gas ordered.  Bubba Camp, RN

## 2017-09-01 ENCOUNTER — Inpatient Hospital Stay: Payer: Medicare HMO

## 2017-09-01 LAB — GLUCOSE, CAPILLARY
GLUCOSE-CAPILLARY: 113 mg/dL — AB (ref 65–99)
GLUCOSE-CAPILLARY: 120 mg/dL — AB (ref 65–99)
GLUCOSE-CAPILLARY: 135 mg/dL — AB (ref 65–99)
Glucose-Capillary: 116 mg/dL — ABNORMAL HIGH (ref 65–99)
Glucose-Capillary: 127 mg/dL — ABNORMAL HIGH (ref 65–99)
Glucose-Capillary: 139 mg/dL — ABNORMAL HIGH (ref 65–99)

## 2017-09-01 LAB — BLOOD GAS, ARTERIAL
Acid-Base Excess: 17.4 mmol/L — ABNORMAL HIGH (ref 0.0–2.0)
Bicarbonate: 42.7 mmol/L — ABNORMAL HIGH (ref 20.0–28.0)
FIO2: 0.6
MECHVT: 500 mL
O2 Saturation: 98.8 %
PATIENT TEMPERATURE: 37
PCO2 ART: 56 mmHg — AB (ref 32.0–48.0)
PEEP/CPAP: 5 cmH2O
PO2 ART: 114 mmHg — AB (ref 83.0–108.0)
RATE: 20 resp/min
pH, Arterial: 7.49 — ABNORMAL HIGH (ref 7.350–7.450)

## 2017-09-01 LAB — BASIC METABOLIC PANEL
Anion gap: 8 (ref 5–15)
BUN: 58 mg/dL — ABNORMAL HIGH (ref 6–20)
CALCIUM: 7.9 mg/dL — AB (ref 8.9–10.3)
CO2: 33 mmol/L — AB (ref 22–32)
CREATININE: 2.08 mg/dL — AB (ref 0.44–1.00)
Chloride: 96 mmol/L — ABNORMAL LOW (ref 101–111)
GFR, EST AFRICAN AMERICAN: 28 mL/min — AB (ref >60)
GFR, EST NON AFRICAN AMERICAN: 24 mL/min — AB (ref >60)
GLUCOSE: 119 mg/dL — AB (ref 65–99)
Potassium: 4.1 mmol/L (ref 3.5–5.1)
Sodium: 137 mmol/L (ref 135–145)

## 2017-09-01 LAB — MAGNESIUM: Magnesium: 2.4 mg/dL (ref 1.7–2.4)

## 2017-09-01 LAB — CBC
HCT: 22.4 % — ABNORMAL LOW (ref 35.0–47.0)
Hemoglobin: 7.6 g/dL — ABNORMAL LOW (ref 12.0–16.0)
MCH: 26.9 pg (ref 26.0–34.0)
MCHC: 34 g/dL (ref 32.0–36.0)
MCV: 79 fL — ABNORMAL LOW (ref 80.0–100.0)
PLATELETS: 267 10*3/uL (ref 150–440)
RBC: 2.83 MIL/uL — ABNORMAL LOW (ref 3.80–5.20)
RDW: 14.8 % — AB (ref 11.5–14.5)
WBC: 10.9 10*3/uL (ref 3.6–11.0)

## 2017-09-01 LAB — CULTURE, RESPIRATORY

## 2017-09-01 LAB — VANCOMYCIN, RANDOM: VANCOMYCIN RM: 20

## 2017-09-01 LAB — TRIGLYCERIDES: TRIGLYCERIDES: 106 mg/dL (ref <150)

## 2017-09-01 LAB — PHOSPHORUS: Phosphorus: 4.6 mg/dL (ref 2.5–4.6)

## 2017-09-01 IMAGING — CR DG CHEST 2V
2 series · 2 of 2 positions shown · non-contrast
Comparison: 02/22/2015

CLINICAL DATA: Productive cough for 1 month

EXAM:
CHEST  2 VIEW

[chest pa]
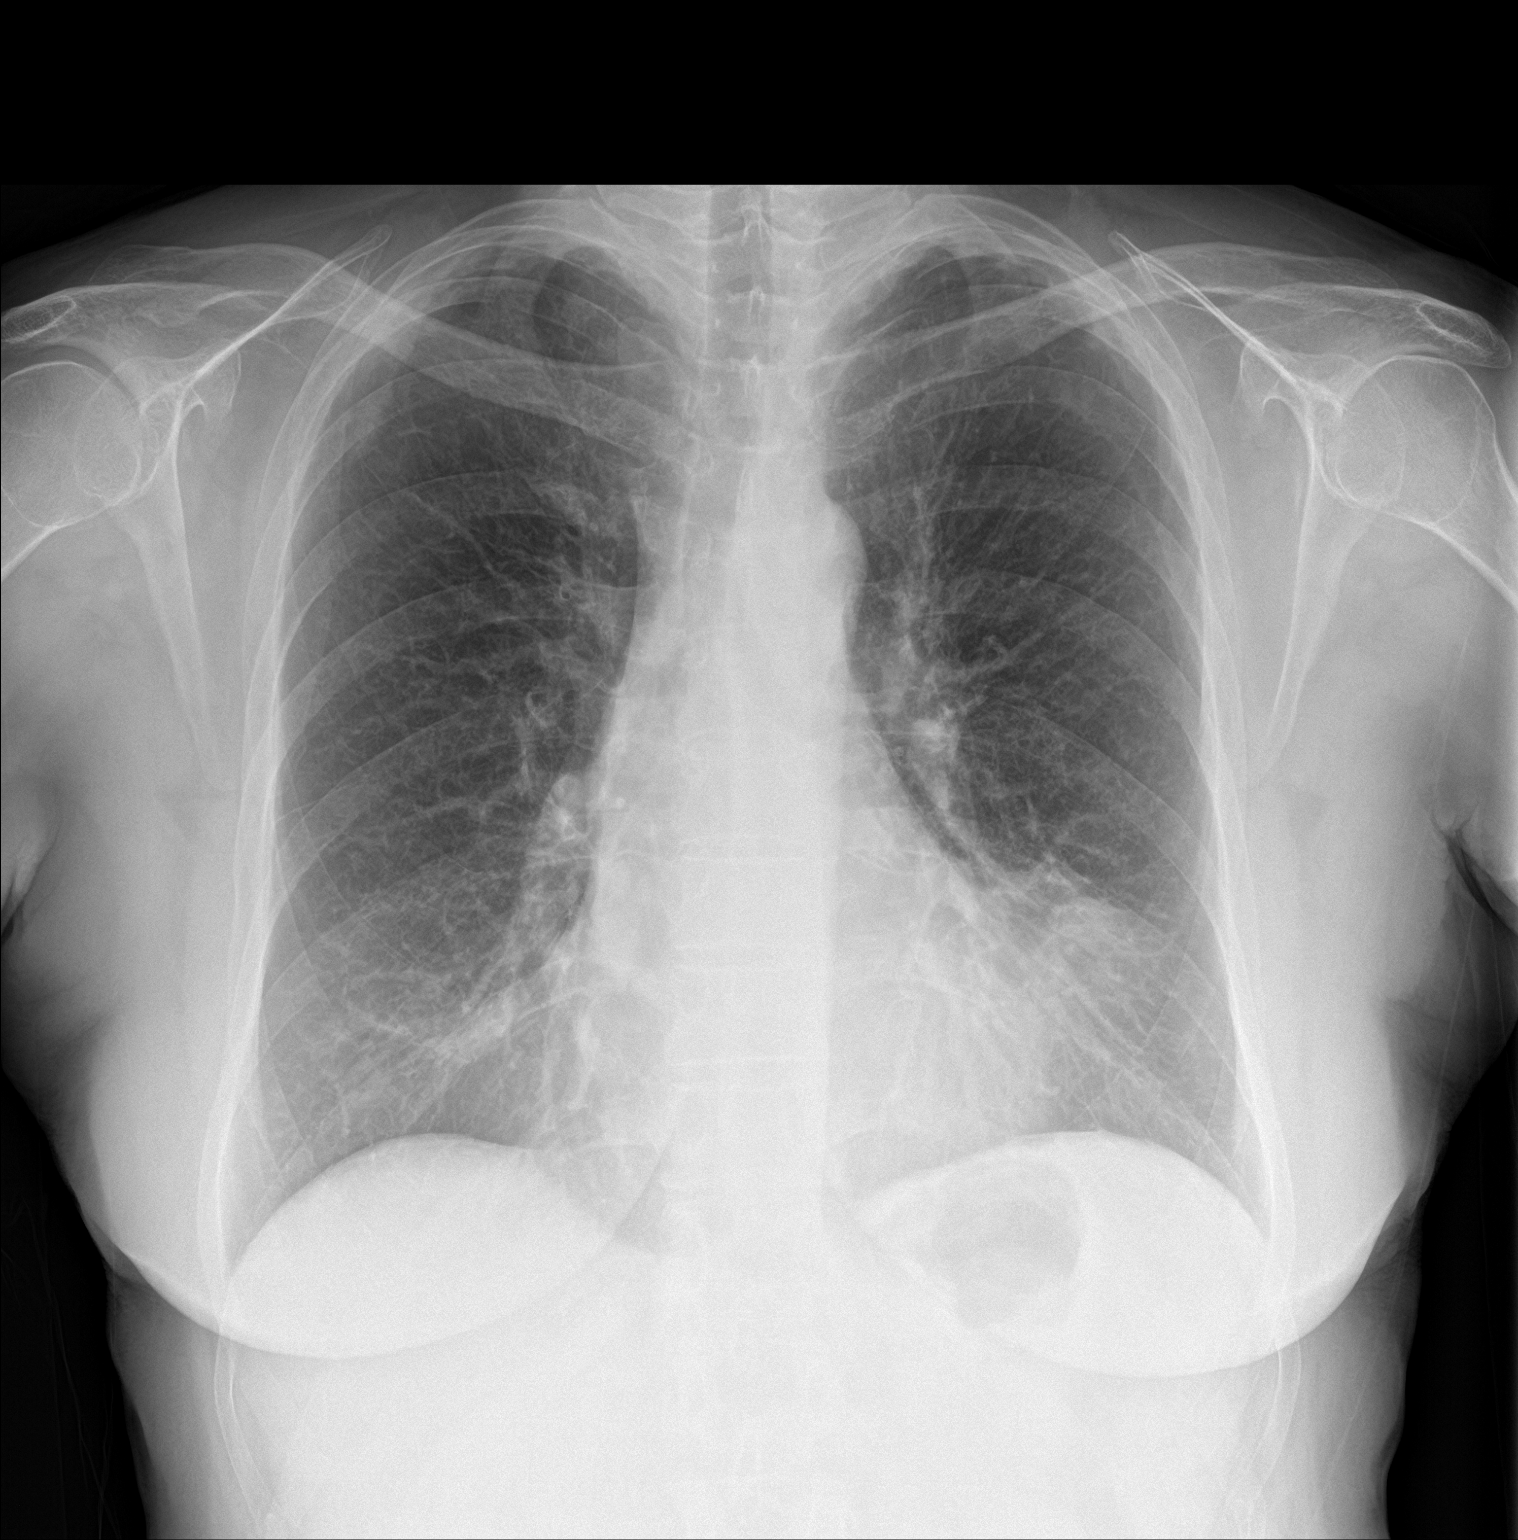

[chest lat]
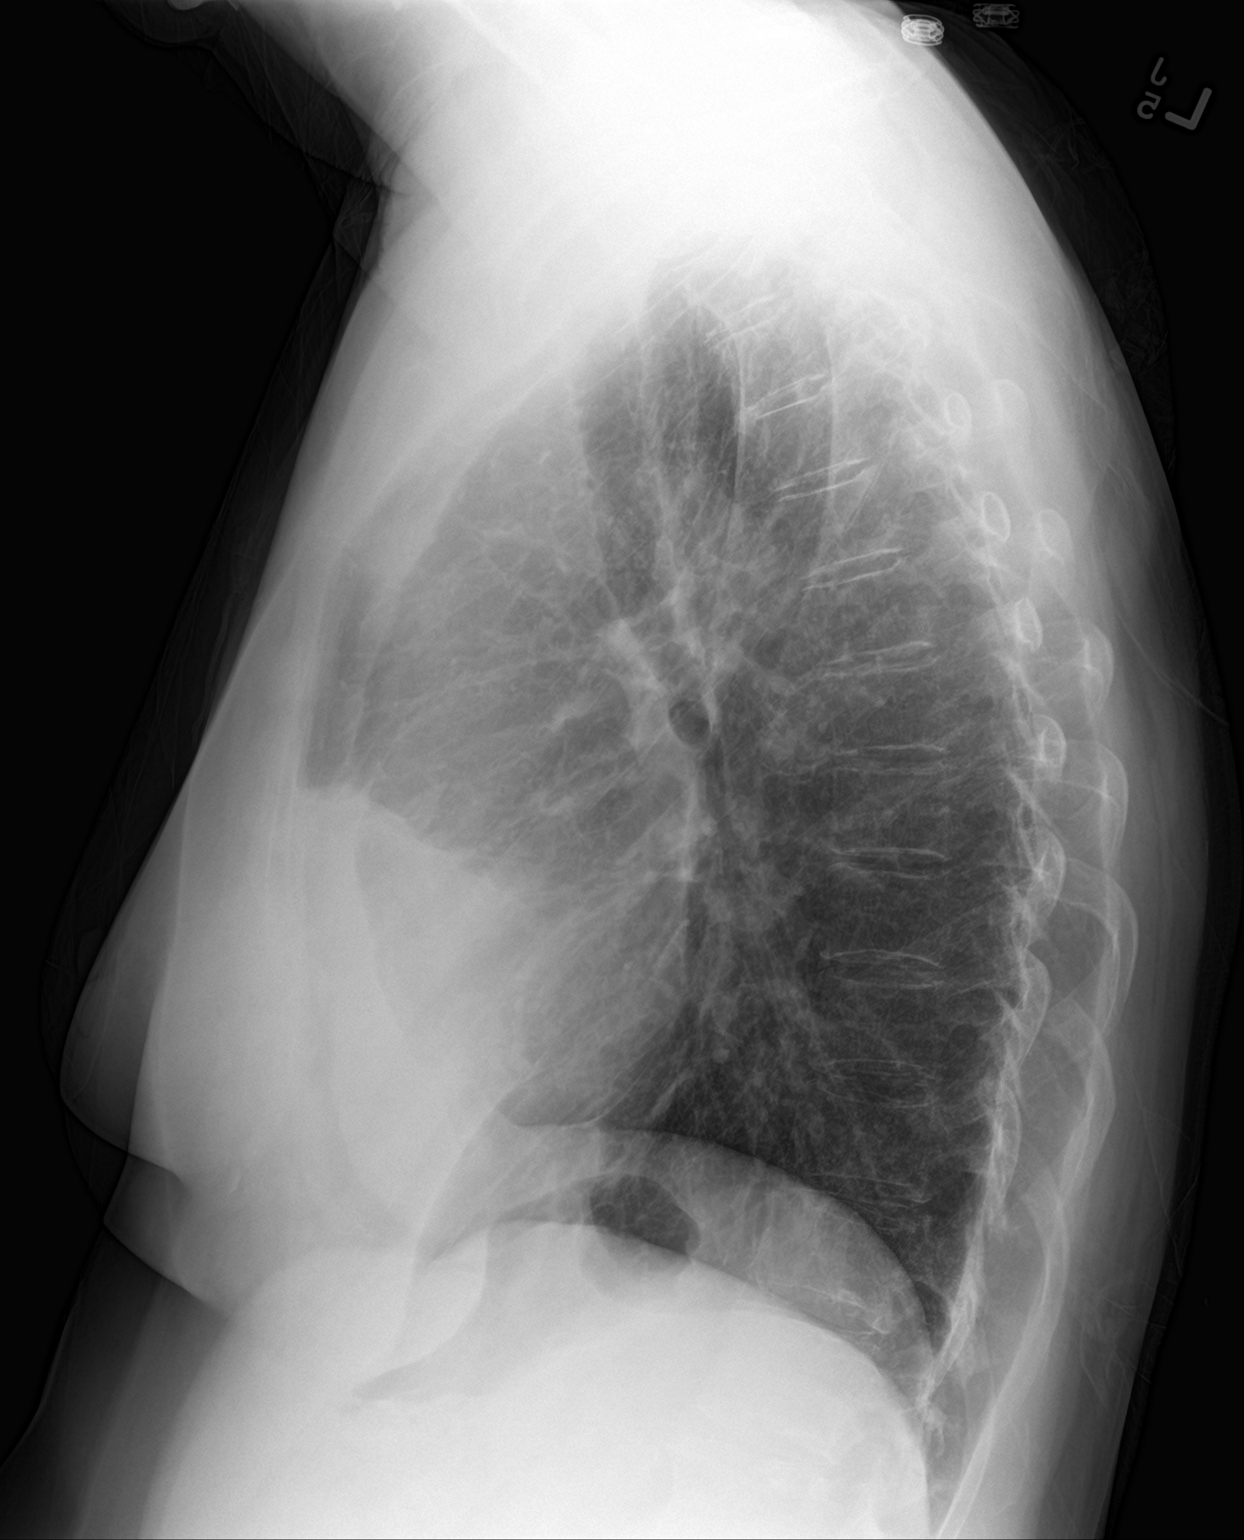

[2 of 2 positions shown; findings below may reference images not displayed]

FINDINGS: Cardiac shadow is within normal limits. Lingular infiltrate is seen.
No sizable effusion is noted. Apical scarring is noted bilaterally.
No bony abnormality is seen.
IMPRESSION: Lingular infiltrate. Followup PA and lateral chest X-ray is
recommended in 3-4 weeks following trial of antibiotic therapy to
ensure resolution and exclude underlying malignancy.

## 2017-09-01 MED ORDER — DEXTROSE 5 % IV SOLN
1.0000 g | INTRAVENOUS | Status: DC
  Administered 2017-09-02: 1 g via INTRAVENOUS
  Filled 2017-09-01: qty 1

## 2017-09-01 MED ORDER — VANCOMYCIN HCL IN DEXTROSE 1-5 GM/200ML-% IV SOLN
1000.0000 mg | Freq: Once | INTRAVENOUS | Status: AC
  Administered 2017-09-01: 1000 mg via INTRAVENOUS
  Filled 2017-09-01 (×2): qty 200

## 2017-09-01 NOTE — Progress Notes (Signed)
Pharmacy Antibiotic Note  Morgan [name] is a 66 y.o. female admitted on 08/26/2017 with pneumonia.  Pharmacy has been consulted for vanc/cefepime dosing. Azithromycin added 9/12.   Plan: Will discontinue vancomycin, check a random vancomycin level tomorrow AM, and reassess dosing from there.   9/16 @ 0400 VR 20 therapeutic. Patient is still in acute renal failure and it is progressing, however, her vanc level is at 20 so will administer vanc 1g IV x 1 and will recheck VR w/ am labs.  Cefepime: Will dose as if CrCl <10 ml/min due to rapidly changing Scr. Will change to 1g q24 hours.     Height: 6\' 1"  (185.4 cm) Weight: 212 lb 4.9 oz (96.3 kg) IBW/kg (Calculated) : 75.4  Temp (24hrs), Avg:99.1 F (37.3 C), Min:97.6 F (36.4 C), Max:101.2 F (38.4 C)   Recent Labs Lab 08/26/17 2148  08/27/17 1903 08/28/17 0416 08/29/17 0451 08/30/17 0454 08/30/17 0500 08/30/17 1738 08/31/17 0500 08/31/17 0849 09/01/17 0410  WBC  --   < >  --  20.3* 21.4* 13.9*  --   --  10.0  --  10.9  CREATININE  --   < >  --  0.53 0.70  --  1.16*  --  1.68*  --  2.08*  LATICACIDVEN 1.9  --   --   --   --   --   --   --   --   --   --   VANCOTROUGH  --   --  18  --   --   --   --  40*  --   --   --   VANCORANDOM  --   --   --   --   --   --   --   --   --  29 20  < > = values in this interval not displayed.  Estimated Creatinine Clearance: 36.1 mL/min (A) (by C-G formula based on SCr of 2.08 mg/dL (H)).    Allergies  Allergen Reactions  . Demerol [Meperidine]     Hives   . Strawberry (Diagnostic) Hives   ABX: Vancomycin 9/10 >> 9/12, 9/13 >> Cefepime 9/10 >> Azithromycin 9/12 >>  Micro: 9/13 SCx: penidng 9/12 MRSA PCR: negative 9/10 BCx: NGTD 9/10 C diff: negative  Thank you for allowing pharmacy to be a part of this patient's care.  Larene Beach, PharmD, BCPS Clinical Pharmacist 09/01/2017 2:46 PM

## 2017-09-01 NOTE — Progress Notes (Signed)
Pharmacy Antibiotic Note  Morgan [name] is a 66 y.o. female admitted on 08/26/2017 with pneumonia.  Pharmacy has been consulted for vanc/cefepime dosing. Azithromycin added 9/12.   Plan: Will discontinue vancomycin, check a random vancomycin level tomorrow AM, and reassess dosing from there.   9/16 @ 0400 VR 20 therapeutic. Patient is still in acute renal failure and it is progressing, however, her vanc level is at 20 so will administer vanc 1g IV x 1 and will recheck VR w/ am labs.  Will decrease cefepime to 1g IV q 12 hours.    Height: 6\' 1"  (185.4 cm) Weight: 212 lb 4.9 oz (96.3 kg) IBW/kg (Calculated) : 75.4  Temp (24hrs), Avg:99 F (37.2 C), Min:97.6 F (36.4 C), Max:101.2 F (38.4 C)   Recent Labs Lab 08/26/17 2148  08/27/17 1903 08/28/17 0416 08/29/17 0451 08/30/17 0454 08/30/17 0500 08/30/17 1738 08/31/17 0500 08/31/17 0849 09/01/17 0410  WBC  --   < >  --  20.3* 21.4* 13.9*  --   --  10.0  --  10.9  CREATININE  --   < >  --  0.53 0.70  --  1.16*  --  1.68*  --  2.08*  LATICACIDVEN 1.9  --   --   --   --   --   --   --   --   --   --   VANCOTROUGH  --   --  18  --   --   --   --  40*  --   --   --   VANCORANDOM  --   --   --   --   --   --   --   --   --  29 20  < > = values in this interval not displayed.  Estimated Creatinine Clearance: 36.1 mL/min (A) (by C-G formula based on SCr of 2.08 mg/dL (H)).    Allergies  Allergen Reactions  . Demerol [Meperidine]     Hives   . Strawberry (Diagnostic) Hives   ABX: Vancomycin 9/10 >> 9/12, 9/13 >> Cefepime 9/10 >> Azithromycin 9/12 >>  Micro: 9/13 SCx: penidng 9/12 MRSA PCR: negative 9/10 BCx: NGTD 9/10 C diff: negative  Thank you for allowing pharmacy to be a part of this patient's care.  Tobie Lords, PharmD, BCPS Clinical Pharmacist 09/01/2017 6:32 AM

## 2017-09-01 NOTE — Progress Notes (Signed)
[place] [facility] [place] NAME: Morgan [name]    MR#:  [id number]  DATE OF BIRTH:  November 04, 1951  SUBJECTIVE: On full vent support.   CHIEF COMPLAINT:  Was on NRBM , so transferred to ICU, remained on cont bipap, still have worsening in her Xray chest, so decided to intubate 08/29/17.  REVIEW OF SYSTEMS:   Could not provide due to respi failure and intubation.   DRUG ALLERGIES:   Allergies  Allergen Reactions  . Demerol [Meperidine]     Hives   . Strawberry (Diagnostic) Hives    VITALS:  Blood pressure 99/64, pulse 78, temperature 99.1 F (37.3 C), resp. rate 20, height 6\' 1"  (1.854 m), weight 96.3 kg (212 lb 4.9 oz), SpO2 96 %.  PHYSICAL EXAMINATION:  GENERAL:  66 y.o.-year-old patient lying in the bed with no acute distress. Currently intubated orally. EYES: Pupils equal, round, reactive to light . No scleral icterus. Marland Kitchen  HEENT: Head atraumatic, normocephalic. Oropharynx and nasopharynx clear.  NECK:  Supple, no jugular venous distention. No thyroid enlargement, no tenderness.  LUNGS: clear  to auscultation, no wheezing. CARDIOVASCULAR: S1, S2 normal. No murmurs, rubs, or gallops.  ABDOMEN: Soft, nontender, nondistended. Bowel sounds present. No organomegaly or mass.  EXTREMITIES: No pedal edema, cyanosis, or clubbing.  NEUROLOGIC: sedated on ventilator support. PSYCHIATRIC: The patient is sedated.  SKIN: No obvious rash, lesion, or ulcer.      LABORATORY PANEL:   CBC  Recent Labs Lab 09/01/17 0410  WBC 10.9  HGB 7.6*  HCT 22.4*  PLT 267   ------------------------------------------------------------------------------------------------------------------  Chemistries   Recent Labs Lab 08/30/17 0500  09/01/17 0410  NA 136  < > 137  K 4.1  < > 4.1  CL 94*  < > 96*  CO2 36*  < > 33*  GLUCOSE 115*  < > 119*  BUN 31*  < > 58*  CREATININE 1.16*  < > 2.08*  CALCIUM 8.4*  < > 7.9*  MG  --   < > 2.4  AST 21  --   --    ALT 12*  --   --   ALKPHOS 91  --   --   BILITOT 0.4  --   --   < > = values in this interval not displayed. ------------------------------------------------------------------------------------------------------------------  Cardiac Enzymes  Recent Labs Lab 08/29/17 0451  TROPONINI 0.67*   ------------------------------------------------------------------------------------------------------------------  RADIOLOGY:  Dg Chest Port 1 View  Result Date: 09/01/2017 CLINICAL DATA:  assisted ventilation. EXAM: PORTABLE CHEST 1 VIEW COMPARISON:  08/31/2017 FINDINGS: Endotracheal tube terminates 5.7 cm above carina.Nasogastric extends beyond the inferior aspect of the film. Right-sided PICC line terminates at the superior caval/ atrial junction. Midline trachea. Borderline cardiomegaly. No pleural effusion or pneumothorax. Patchy bibasilar airspace disease is similar. Underlying interstitial thickening remains. IMPRESSION: No significant change in bibasilar airspace disease. Electronically Signed   By: Abigail Miyamoto M.D.   On: 09/01/2017 11:05   Dg Chest Port 1 View  Result Date: 08/31/2017 CLINICAL DATA:  Acute respiratory failure. EXAM: PORTABLE CHEST 1 VIEW COMPARISON:  08/30/2017 and previous FINDINGS: Endotracheal tube tip is 6 cm above the carina. Nasogastric tube enters the abdomen. Right arm PICC tip is at the SVC RA junction. Patchy bilateral airspace density persists, but is improved in the lower lungs with better aeration. No worsening or new finding. IMPRESSION: Slight radiographic improvement with better aeration in the lower lungs. Electronically Signed   By: Jan Fireman.D.  On: 08/31/2017 06:56    EKG:   Orders placed or performed during the hospital encounter of 08/26/17  . EKG 12-Lead  . EKG 12-Lead  . EKG 12-Lead  . EKG 12-Lead    ASSESSMENT AND PLAN:   * Sepsis ([place]) -Secondary to healthcare associated pneumonia, multilobar pneumonia  IV antibiotics cefepime and  vanc, added azithromycin. Lactic acid normal at 1.9 bloodCultures negative. Sputum cx are not sent.  Acute respiratory failure due to multifocal pneumonia. Status post intubation, continue full ventilator, bronchodilators, Pulmicort nebulizers. Continue IV antibiotics. Wean as tolerated. Elevated pro-calcitonin levels up to 0.44.  hypokalemia replete and recheck   #  Essential hypertension - continue home med amlodipine     acute kidney injury secondary to sepsis. Continue IV fluids, worsening kidney function. Creatinine up to 2 daily. Went up from 1.6822. Monitor kidney function closely, monitor urine output.  All the records are reviewed and case discussed with Care Management/Social Workerr. Management plans discussed with the patient, family and they are in agreement.  CODE STATUS: fc   TOTAL TIME TAKING CARE OF THIS PATIENT: 35  minutes.   POSSIBLE D/C IN 2-3  DAYS, DEPENDING ON CLINICAL CONDITION.  Note: This dictation was prepared with Dragon dictation along with smaller phrase technology. Any transcriptional errors that result from this process are unintentional.   Indiana Pechacek M.D on 09/01/2017 at 11:30 AM  Between 7am to 6pm - Pager - [phone] After 6pm go to [email or web address] - password [email or web address] [place] [facility]  Office  [phone]  CC: Primary care physician; Javier Docker, MD

## 2017-09-01 NOTE — Progress Notes (Signed)
   Name: Morgan [name] MRN: [id number] DOB: 1951-05-17    BRIEF PATIENT DESCRIPTION:  66 yo female admitted to medsurg unit 09/10 with acute hypoxic respiratory failure secondary to multifocal pneumonia transferred to ICU 09/12 due to worsening respiratory failure requiring continuous Bipap.  Pt required mechanical intubation 09/13.  SIGNIFICANT EVENTS  09/10-Pt admitted to [facility] unit  09/12-Pt transferred to ICU  09/13-Pt mechanically intubated   STUDIES:  Echo 09/12>>EF 40%-45%  REVIEW OF SYSTEMS:  Unable to assess pt intubated   SUBJECTIVE:  Last 24 hours patient has some difficulty with desaturations requiring increasing FiO2. Also was agitated requiring increasing sedation last p.m.  VITAL SIGNS: Temp:  [97.6 F (36.4 C)-101.2 F (38.4 C)] 97.6 F (36.4 C) (09/16 0400) Pulse Rate:  [73-92] 76 (09/16 0600) Resp:  [18-22] 19 (09/16 0600) BP: (81-119)/(49-86) 87/55 (09/16 0600) SpO2:  [86 %-96 %] 96 % (09/16 0832) FiO2 (%):  [30 %-40 %] 40 % (09/16 0832) Weight:  [96.3 kg (212 lb 4.9 oz)] 96.3 kg (212 lb 4.9 oz) (09/16 0415)  PHYSICAL EXAMINATION: General: acutely ill appearing Caucasian female  Neuro: sedated, follows commands, PERRL   HEENT: supple, no JVD  Cardiovascular: s1s2, RRR, no M/R/G Lungs: rhonchi throughout, even, non labored mechanically intubated  Abdomen: +BS x4, soft, obese, non tender, non distended  Musculoskeletal: normal bulk and tone, no edema  Skin: intact no rashes or lesions    Recent Labs Lab 08/30/17 0500 08/31/17 0500 09/01/17 0410  NA 136 135 137  K 4.1 3.8 4.1  CL 94* 93* 96*  CO2 36* 33* 33*  BUN 31* 45* 58*  CREATININE 1.16* 1.68* 2.08*  GLUCOSE 115* 136* 119*    Recent Labs Lab 08/30/17 0454 08/31/17 0500 09/01/17 0410  HGB 8.1* 7.9* 7.6*  HCT 25.1* 23.8* 22.4*  WBC 13.9* 10.0 10.9  PLT 300 246 267   Dg Chest Port 1 View  Result Date: 08/31/2017 CLINICAL DATA:  Acute respiratory failure. EXAM: PORTABLE CHEST 1  VIEW COMPARISON:  08/30/2017 and previous FINDINGS: Endotracheal tube tip is 6 cm above the carina. Nasogastric tube enters the abdomen. Right arm PICC tip is at the SVC RA junction. Patchy bilateral airspace density persists, but is improved in the lower lungs with better aeration. No worsening or new finding. IMPRESSION: Slight radiographic improvement with better aeration in the lower lungs. Electronically Signed   By: Nelson Chimes M.D.   On: 08/31/2017 06:56    ASSESSMENT / PLAN: Acute hypoxic respiratory failure secondary to multifocal pneumonia and with component of ARDS. Patient has had some chest x-ray improvement. Has weaned down on FiO2. We'll attempt spontaneous awakening and breathing trial with post trial blood gas.   Acute renal failure. Has worsened. Will Monitor closely, no acute indication for hemodialysis.Will check renal ultrasound. May require nephrology consult tomorrow  Anemia without acute blood loss Stable hemodynamics off of pressors  Critical care time 40 minutes, respiratory failure, sepsis, pneumonia, renal insufficiency  Hermelinda Dellen, D.O.

## 2017-09-01 NOTE — Progress Notes (Signed)
Patient did well with WUA and was off sedation for 1 hour and fifteen minutes, then had to be returned to full sedation with bolus. Has had to go up on sedation during the shift with multi boluses. Tried to pull at ETT several times even with the soft mitts. Not following commands as well this shift.

## 2017-09-02 DIAGNOSIS — N179 Acute kidney failure, unspecified: Secondary | ICD-10-CM

## 2017-09-02 DIAGNOSIS — J810 Acute pulmonary edema: Secondary | ICD-10-CM

## 2017-09-02 DIAGNOSIS — J80 Acute respiratory distress syndrome: Secondary | ICD-10-CM

## 2017-09-02 LAB — BASIC METABOLIC PANEL
Anion gap: 7 (ref 5–15)
BUN: 71 mg/dL — AB (ref 6–20)
CHLORIDE: 100 mmol/L — AB (ref 101–111)
CO2: 31 mmol/L (ref 22–32)
CREATININE: 2.2 mg/dL — AB (ref 0.44–1.00)
Calcium: 8.2 mg/dL — ABNORMAL LOW (ref 8.9–10.3)
GFR calc Af Amer: 26 mL/min — ABNORMAL LOW (ref >60)
GFR calc non Af Amer: 22 mL/min — ABNORMAL LOW (ref >60)
GLUCOSE: 133 mg/dL — AB (ref 65–99)
POTASSIUM: 4.4 mmol/L (ref 3.5–5.1)
Sodium: 138 mmol/L (ref 135–145)

## 2017-09-02 LAB — CBC
HEMATOCRIT: 22.2 % — AB (ref 35.0–47.0)
HEMOGLOBIN: 7.3 g/dL — AB (ref 12.0–16.0)
MCH: 26.6 pg (ref 26.0–34.0)
MCHC: 33.1 g/dL (ref 32.0–36.0)
MCV: 80.3 fL (ref 80.0–100.0)
Platelets: 260 10*3/uL (ref 150–440)
RBC: 2.76 MIL/uL — AB (ref 3.80–5.20)
RDW: 14.8 % — ABNORMAL HIGH (ref 11.5–14.5)
WBC: 11 10*3/uL (ref 3.6–11.0)

## 2017-09-02 LAB — GLUCOSE, CAPILLARY
GLUCOSE-CAPILLARY: 157 mg/dL — AB (ref 65–99)
Glucose-Capillary: 104 mg/dL — ABNORMAL HIGH (ref 65–99)
Glucose-Capillary: 115 mg/dL — ABNORMAL HIGH (ref 65–99)
Glucose-Capillary: 119 mg/dL — ABNORMAL HIGH (ref 65–99)
Glucose-Capillary: 122 mg/dL — ABNORMAL HIGH (ref 65–99)
Glucose-Capillary: 129 mg/dL — ABNORMAL HIGH (ref 65–99)
Glucose-Capillary: 141 mg/dL — ABNORMAL HIGH (ref 65–99)

## 2017-09-02 MED ORDER — VITAL AF 1.2 CAL PO LIQD: 1000.0000 mL | ORAL | Status: DC

## 2017-09-02 MED ORDER — FLUOXETINE HCL 20 MG PO CAPS
20.0000 mg | ORAL_CAPSULE | Freq: Every day | ORAL | Status: DC
  Administered 2017-09-03: 20 mg
  Filled 2017-09-02 (×3): qty 1

## 2017-09-02 MED ORDER — FAMOTIDINE 20 MG PO TABS
20.0000 mg | ORAL_TABLET | Freq: Every day | ORAL | Status: DC
  Administered 2017-09-02 – 2017-09-03 (×2): 20 mg
  Filled 2017-09-02 (×2): qty 1

## 2017-09-02 MED ORDER — INSULIN ASPART 100 UNIT/ML ~~LOC~~ SOLN
0.0000 [IU] | SUBCUTANEOUS | Status: DC
  Administered 2017-09-03 – 2017-09-05 (×7): 1 [IU] via SUBCUTANEOUS
  Filled 2017-09-02 (×7): qty 1

## 2017-09-02 MED ORDER — FUROSEMIDE 10 MG/ML IJ SOLN
40.0000 mg | Freq: Once | INTRAMUSCULAR | Status: AC
  Administered 2017-09-02: 40 mg via INTRAVENOUS
  Filled 2017-09-02: qty 4

## 2017-09-02 MED ORDER — ENOXAPARIN SODIUM 30 MG/0.3ML ~~LOC~~ SOLN
30.0000 mg | SUBCUTANEOUS | Status: DC
  Administered 2017-09-02: 30 mg via SUBCUTANEOUS
  Filled 2017-09-02: qty 0.3

## 2017-09-02 NOTE — Progress Notes (Signed)
Nutrition Follow-up  DOCUMENTATION CODES:   Not applicable  INTERVENTION:  Recommend new goal TF regimen of Vital AF 1.2 at 65 ml/hr (1560 goal daily volume) per OGT. Provides 1872 kcal, 117 grams of protein, 1264 ml H2O daily.  Continue minimum free water flush on pump of 30 ml Q4hrs as patient's weight has trended up. Noted patient has been ordered for BNP with AM labs.  NUTRITION DIAGNOSIS:   Inadequate oral intake related to inability to eat (pt sedated on vent) as evidenced by NPO status.  Ongoing.  GOAL:   Provide needs based on ASPEN/SCCM guidelines  Met with TF regimen.  MONITOR:   Vent status, Labs, Weight trends, I & O's, TF tolerance  REASON FOR ASSESSMENT:   Consult Enteral/tube feeding initiation and management  ASSESSMENT:   66 yo female with PMH of Former Smoker, Neuromuscular Disorder, HTN, Depression, Arthritis, Anxiety, Anemia, and Allergies.  She presented to [facility] ER 09/10 with c/o fever, cough, malaise, and myalgias onset of symptoms 3 days prior to presentation to ER.  She also states she's had diarrhea for the last 3 months on average 6 stools daily.  In the ER CXR revealed multifocal pneumonia and she met sepsis criteria, therefore iv abx were initiated.  She was subsequently admitted to the [facility] unit by hospitalist team for further workup and treatment.  On 09/12 she developed worsening hypoxia, therefore she was placed on a non rebreather mask with O2 sats remaining in the 80's.  She was subsequently placed on continuous Bipap and transferred to ICU 09/12  -On 9/15 only tolerated 2 hours on WUA. On 9/16 was off sedation for 1.25 hours. Not following commands.   No family at bedside during assessment. Noted on assessment patient had rectal tube, but did not see it documented in chart.  Access: 16 Fr. OGT placed 08/29/2017; tip confirmed to project over stomach per chest x-ray on 9/13; 66 cm at corner of mouth  TF: patient tolerating Vital AF 1.2 at  55 ml/hr; per pump history patient has received 1216 ml of Vital AF 1.2 in past 24 hrs (92% goal volume of tube feeding)  Patient is currently intubated on ventilator support MV: 9.8 L/min Temp (24hrs), Avg:98.8 F (37.1 C), Min:98.1 F (36.7 C), Max:99.3 F (37.4 C)  Propofol: N/A; was at 11.4 ml/hr this morning (301 kcal over 24 hrs)  Medications reviewed and include: Senokot, Colace, famotidine, fentanyl gtt.  Labs reviewed: CBG 113-141 past 24 hrs, Chloride 100, BUN 71, Creatinine 2.2. Triglycerides 106 on 9/16.  I/O: from 0700 9/16 to 0700 9/17 patient had 900 ml UOP (only 0.4 ml/kg/hr) + 4 bowel movements; so far today UOP has increased and she has had 1175 ml UOP this shift  Weight trend: 98.8 kg on 9/17 (+9.2 kg from weight on 9/13); will use weight of 89.6 kg to estimate needs  Discussed with RN. Patient was able to transition off propofol today.  Diet Order:     Skin:  Reviewed, no issues  Last BM:  09/02/2017 - medium type 7  Height:   Ht Readings from Last 1 Encounters:  08/26/17 6' 1" (1.854 m)    Weight:   Wt Readings from Last 1 Encounters:  09/02/17 217 lb 13 oz (98.8 kg)    Ideal Body Weight:  75 kg  BMI:  Body mass index is 28.74 kg/m.  Estimated Nutritional Needs:   Kcal:  7408 (PSU 2003b w/ MSJ 1578, Ve 9.8, Tmax 99.3)  Protein:  108-125  grams (1.2-1.4 grams/kg)  Fluid:  2.2 L/day (25 ml/kg)  EDUCATION NEEDS:   Education needs no appropriate at this time  Willey Blade, MS, RD, LDN Pager: [phone] After Hours Pager: [phone]

## 2017-09-02 NOTE — Progress Notes (Signed)
Patient continues on mech vent, sedation, and tube feedings. Abdomen was distended, but with laxatives and stool softeners, patient finally able to have multiple loose BMs. Family to discuss plan of care with MD this week.

## 2017-09-02 NOTE — Progress Notes (Signed)
Name: Morgan [name] MRN: [id number] DOB: [birth year]    BRIEF PATIENT DESCRIPTION:  88 F admitted to medsurg unit 09/10 with acute hypoxic respiratory failure secondary to multifocal pneumonia transferred to ICU 09/12 due to worsening respiratory failure requiring continuous Bipap.  Pt required mechanical intubation 09/13.  SIGNIFICANT EVENTS  09/10 admitted to medsurg unit  09/12 transferred to ICU  09/13  intubated   STUDIES:  Echo 09/12>>EF 40%-45%    SUBJECTIVE:  Sedated, intubated. Minimally responsive. Not following commands  VITAL SIGNS: Temp:  [98.1 F (36.7 C)-98.9 F (37.2 C)] 98.8 F (37.1 C) (09/17 1200) Pulse Rate:  [74-86] 78 (09/17 1200) Resp:  [14-30] 15 (09/17 1200) BP: (92-122)/(59-81) 122/78 (09/17 1200) SpO2:  [92 %-98 %] 98 % (09/17 1200) FiO2 (%):  [35 %] 35 % (09/17 1133) Weight:  [98.8 kg (217 lb 13 oz)] 98.8 kg (217 lb 13 oz) (09/17 0500)  PHYSICAL EXAMINATION: General: Sedated, intubated  Neuro: CNs intact, no focal deficits  HEENT: NCAT, sclerae white  Cardiovascular: Regular, no M Lungs: Clear anteriorly  Abdomen: Mildly distended, soft, + BS Extremities: No edema, warm Skin: intact no rashes or lesions    Recent Labs Lab 08/31/17 0500 09/01/17 0410 09/02/17 0523  NA 135 137 138  K 3.8 4.1 4.4  CL 93* 96* 100*  CO2 33* 33* 31  BUN 45* 58* 71*  CREATININE 1.68* 2.08* 2.20*  GLUCOSE 136* 119* 133*    Recent Labs Lab 08/31/17 0500 09/01/17 0410 09/02/17 0523  HGB 7.9* 7.6* 7.3*  HCT 23.8* 22.4* 22.2*  WBC 10.0 10.9 11.0  PLT 246 267 260   Results for orders placed or performed during the hospital encounter of 08/26/17  C difficile quick scan w PCR reflex     Status: None   Collection Time: 08/26/17  8:37 PM  Result Value Ref Range Status   C Diff antigen NEGATIVE NEGATIVE Final   C Diff toxin NEGATIVE NEGATIVE Final   C Diff interpretation No C. difficile detected.  Final  Culture, blood (Routine X 2) w Reflex to ID  Panel     Status: None   Collection Time: 08/26/17  9:48 PM  Result Value Ref Range Status   Specimen Description BLOOD LEFT HAND  Final   Special Requests   Final    BOTTLES DRAWN AEROBIC AND ANAEROBIC Blood Culture adequate volume   Culture NO GROWTH 5 DAYS  Final   Report Status 08/31/2017 FINAL  Final  Culture, blood (Routine X 2) w Reflex to ID Panel     Status: None   Collection Time: 08/26/17  9:48 PM  Result Value Ref Range Status   Specimen Description BLOOD RAC  Final   Special Requests   Final    BOTTLES DRAWN AEROBIC AND ANAEROBIC Blood Culture results may not be optimal due to an excessive volume of blood received in culture bottles   Culture NO GROWTH 5 DAYS  Final   Report Status 08/31/2017 FINAL  Final  MRSA PCR Screening     Status: None   Collection Time: 08/28/17 11:12 AM  Result Value Ref Range Status   MRSA by PCR NEGATIVE NEGATIVE Final    Comment:        The GeneXpert MRSA Assay (FDA approved for NASAL specimens only), is one component of a comprehensive MRSA colonization surveillance program. It is not intended to diagnose MRSA infection nor to guide or monitor treatment for MRSA infections.   Culture, respiratory (NON-Expectorated)  Status: Abnormal   Collection Time: 08/29/17 10:46 PM  Result Value Ref Range Status   Specimen Description SPUTUM  Final   Special Requests NONE  Final   Gram Stain   Final    RARE WBC PRESENT,BOTH PMN AND MONONUCLEAR NO SQUAMOUS EPITHELIAL CELLS SEEN NO ORGANISMS SEEN Performed at [facility], [address], [place],  [address]    Culture MULTIPLE ORGANISMS PRESENT, NONE PREDOMINANT (A)  Final   Report Status 09/01/2017 FINAL  Final   Anti-infectives    Start     Dose/Rate Route Frequency Ordered Stop   09/02/17 0900  ceFEPIme (MAXIPIME) 1 g in dextrose 5 % 50 mL IVPB  Status:  Discontinued     1 g 100 mL/hr over 30 Minutes Intravenous Every 24 hours 09/01/17 1514 09/02/17 1118   09/01/17 0645   vancomycin (VANCOCIN) IVPB 1000 mg/200 mL premix     1,000 mg 200 mL/hr over 60 Minutes Intravenous  Once 09/01/17 0631 09/01/17 1234   08/30/17 2200  ceFEPIme (MAXIPIME) 1 g in dextrose 5 % 50 mL IVPB  Status:  Discontinued     1 g 100 mL/hr over 30 Minutes Intravenous Every 12 hours 08/30/17 1848 09/01/17 1514   08/29/17 1000  vancomycin (VANCOCIN) IVPB 1000 mg/200 mL premix  Status:  Discontinued     1,000 mg 200 mL/hr over 60 Minutes Intravenous Every 8 hours 08/29/17 0923 08/30/17 1848   08/28/17 1230  azithromycin (ZITHROMAX) 500 mg in dextrose 5 % 250 mL IVPB  Status:  Discontinued     500 mg 250 mL/hr over 60 Minutes Intravenous Every 24 hours 08/28/17 1134 09/02/17 1118   08/27/17 0400  vancomycin (VANCOCIN) IVPB 1000 mg/200 mL premix  Status:  Discontinued     1,000 mg 200 mL/hr over 60 Minutes Intravenous Every 8 hours 08/26/17 2226 08/28/17 1416   08/27/17 0400  ceFEPIme (MAXIPIME) 1 g in dextrose 5 % 50 mL IVPB  Status:  Discontinued     1 g 100 mL/hr over 30 Minutes Intravenous Every 8 hours 08/26/17 2226 08/30/17 1848   08/26/17 1915  ceFEPIme (MAXIPIME) 2 g in dextrose 5 % 50 mL IVPB     2 g 100 mL/hr over 30 Minutes Intravenous  Once 08/26/17 1911 08/26/17 2010   08/26/17 1915  vancomycin (VANCOCIN) IVPB 1000 mg/200 mL premix     1,000 mg 200 mL/hr over 60 Minutes Intravenous  Once 08/26/17 1911 08/26/17 2119      CXR: Diffuse bilateral patchy infiltrates with basilar predominance  ASSESSMENT / PLAN: Ventilator dependent respiratory failure Bilateral pulmonary infiltrates - edema versus ARDS Suspected pneumonia, NOS AKI ICU acquired anemia without overt blood loss ICU/vent associated discomfort  Cont vent support - settings reviewed and/or adjusted Wean in PSV mode as tolerated Cont vent bundle Daily SBT if/when meets criteria Monitor temp, WBC count Micro and abx as above - antibiotics discontinued 09/17 Diuresis as permitted by BP and renal  function Monitor BMET intermittently Monitor I/Os Correct electrolytes as indicated  PAD protocol with daily WUA DVT px: Enoxaparin Monitor CBC intermittently Transfuse per usual guidelines   CCM time: 40 mins The above time includes time spent in consultation with patient and/or family members and reviewing care plan on multidisciplinary rounds  Merton Border, MD PCCM service Mobile [phone] Pager [phone]

## 2017-09-02 NOTE — Progress Notes (Signed)
[place] [facility] [place] NAME: Morgan [name]    MR#:  [id number]  DATE OF BIRTH:  01-23-1951  SUBJECTIVE: On full vent support.weaning trial in progress with pressure support/   CHIEF COMPLAINT:  Was on NRBM , so transferred to ICU, remained on cont bipap, still have worsening in her Xray chest, so decided to intubate 08/29/17.  REVIEW OF SYSTEMS:   Could not provide due to respi failure and intubation.   DRUG ALLERGIES:   Allergies  Allergen Reactions  . Demerol [Meperidine]     Hives   . Strawberry (Diagnostic) Hives    VITALS:  Blood pressure 122/78, pulse 78, temperature 98.8 F (37.1 C), temperature source Oral, resp. rate 15, height 6\' 1"  (1.854 m), weight 98.8 kg (217 lb 13 oz), SpO2 98 %.  PHYSICAL EXAMINATION:  GENERAL:  66 y.o.-year-old patient lying in the bed with no acute distress. Currently intubated orally. EYES: Pupils equal, round, reactive to light . No scleral icterus. Marland Kitchen  HEENT: Head atraumatic, normocephalic. Oropharynx and nasopharynx clear.  NECK:  Supple, no jugular venous distention. No thyroid enlargement, no tenderness.  LUNGS: clear  to auscultation, no wheezing. CARDIOVASCULAR: S1, S2 normal. No murmurs, rubs, or gallops.  ABDOMEN: Soft, nontender, nondistended. Bowel sounds present. No organomegaly or mass.  EXTREMITIES: No pedal edema, cyanosis, or clubbing.  NEUROLOGIC: sedated on ventilator support. PSYCHIATRIC: The patient is sedated.  SKIN: No obvious rash, lesion, or ulcer.      LABORATORY PANEL:   CBC  Recent Labs Lab 09/02/17 0523  WBC 11.0  HGB 7.3*  HCT 22.2*  PLT 260   ------------------------------------------------------------------------------------------------------------------  Chemistries   Recent Labs Lab 08/30/17 0500  09/01/17 0410 09/02/17 0523  NA 136  < > 137 138  K 4.1  < > 4.1 4.4  CL 94*  < > 96* 100*  CO2 36*  < > 33* 31  GLUCOSE 115*  < > 119* 133*   BUN 31*  < > 58* 71*  CREATININE 1.16*  < > 2.08* 2.20*  CALCIUM 8.4*  < > 7.9* 8.2*  MG  --   < > 2.4  --   AST 21  --   --   --   ALT 12*  --   --   --   ALKPHOS 91  --   --   --   BILITOT 0.4  --   --   --   < > = values in this interval not displayed. ------------------------------------------------------------------------------------------------------------------  Cardiac Enzymes  Recent Labs Lab 08/29/17 0451  TROPONINI 0.67*   ------------------------------------------------------------------------------------------------------------------  RADIOLOGY:  Dg Chest Port 1 View  Result Date: 09/01/2017 CLINICAL DATA:  assisted ventilation. EXAM: PORTABLE CHEST 1 VIEW COMPARISON:  08/31/2017 FINDINGS: Endotracheal tube terminates 5.7 cm above carina.Nasogastric extends beyond the inferior aspect of the film. Right-sided PICC line terminates at the superior caval/ atrial junction. Midline trachea. Borderline cardiomegaly. No pleural effusion or pneumothorax. Patchy bibasilar airspace disease is similar. Underlying interstitial thickening remains. IMPRESSION: No significant change in bibasilar airspace disease. Electronically Signed   By: Abigail Miyamoto M.D.   On: 09/01/2017 11:05    EKG:   Orders placed or performed during the hospital encounter of 08/26/17  . EKG 12-Lead  . EKG 12-Lead  . EKG 12-Lead  . EKG 12-Lead    ASSESSMENT AND PLAN:   * Sepsis ([place]) -Secondary to healthcare associated pneumonia, multilobar pneumonia  IV antibiotics cefepime and vanc, added azithromycin.  Lactic acid normal at 1.9 bloodCultures negative. Sputum cx are not sent.   Acute respiratory failure due to multifocal pneumonia. Status post intubation, continue full ventilator,  bronchodilators, Pulmicort nebulizers. Continue IV antibiotics. Wean as tolerated. Elevated pro-calcitonin levels up to 0.44.  hypokalemia replete and recheck   #  Essential hypertension - continue home med amlodipine      acute kidney injury secondary to sepsis. Continue IV fluids, worsening kidney function. Creatinine up to 2 .20; Monitor closely.  All the records are reviewed and case discussed with Care Management/Social Workerr. Management plans discussed with the patient, family and they are in agreement.  CODE STATUS: fc   TOTAL TIME TAKING CARE OF THIS PATIENT: 35  minutes.   POSSIBLE D/C IN 2-3  DAYS, DEPENDING ON CLINICAL CONDITION.  Note: This dictation was prepared with Dragon dictation along with smaller phrase technology. Any transcriptional errors that result from this process are unintentional.   Lukis Bunt M.D on 09/02/2017 at 12:43 PM  Between 7am to 6pm - Pager - [phone] After 6pm go to [email or web address] - password [email or web address] [place] [facility]  Office  [phone]  CC: Primary care physician; Javier Docker, MD

## 2017-09-03 ENCOUNTER — Inpatient Hospital Stay: Payer: Medicare HMO

## 2017-09-03 ENCOUNTER — Encounter: Payer: Self-pay | Admitting: Pulmonary Disease

## 2017-09-03 LAB — COMPREHENSIVE METABOLIC PANEL
ALT: 77 U/L — AB (ref 14–54)
ANION GAP: 12 (ref 5–15)
AST: 58 U/L — ABNORMAL HIGH (ref 15–41)
Albumin: 2.2 g/dL — ABNORMAL LOW (ref 3.5–5.0)
Alkaline Phosphatase: 175 U/L — ABNORMAL HIGH (ref 38–126)
BUN: 78 mg/dL — ABNORMAL HIGH (ref 6–20)
CHLORIDE: 100 mmol/L — AB (ref 101–111)
CO2: 31 mmol/L (ref 22–32)
CREATININE: 2.32 mg/dL — AB (ref 0.44–1.00)
Calcium: 8.8 mg/dL — ABNORMAL LOW (ref 8.9–10.3)
GFR calc non Af Amer: 21 mL/min — ABNORMAL LOW (ref >60)
GFR, EST AFRICAN AMERICAN: 24 mL/min — AB (ref >60)
Glucose, Bld: 119 mg/dL — ABNORMAL HIGH (ref 65–99)
Potassium: 4.9 mmol/L (ref 3.5–5.1)
SODIUM: 143 mmol/L (ref 135–145)
Total Bilirubin: 0.7 mg/dL (ref 0.3–1.2)
Total Protein: 6.5 g/dL (ref 6.5–8.1)

## 2017-09-03 LAB — GLUCOSE, CAPILLARY
GLUCOSE-CAPILLARY: 114 mg/dL — AB (ref 65–99)
GLUCOSE-CAPILLARY: 111 mg/dL — AB (ref 65–99)
Glucose-Capillary: 136 mg/dL — ABNORMAL HIGH (ref 65–99)
Glucose-Capillary: 117 mg/dL — ABNORMAL HIGH (ref 65–99)
Glucose-Capillary: 113 mg/dL — ABNORMAL HIGH (ref 65–99)

## 2017-09-03 LAB — CBC
HEMATOCRIT: 24.2 % — AB (ref 35.0–47.0)
HEMOGLOBIN: 8 g/dL — AB (ref 12.0–16.0)
MCH: 26.1 pg (ref 26.0–34.0)
MCHC: 33.1 g/dL (ref 32.0–36.0)
MCV: 79.1 fL — ABNORMAL LOW (ref 80.0–100.0)
PLATELETS: 331 10*3/uL (ref 150–440)
RBC: 3.06 MIL/uL — AB (ref 3.80–5.20)
RDW: 15 % — ABNORMAL HIGH (ref 11.5–14.5)
WBC: 14.6 10*3/uL — ABNORMAL HIGH (ref 3.6–11.0)

## 2017-09-03 LAB — BRAIN NATRIURETIC PEPTIDE: B Natriuretic Peptide: 1234 pg/mL — ABNORMAL HIGH (ref 0.0–100.0)

## 2017-09-03 MED ORDER — VITAL AF 1.2 CAL PO LIQD: 1000.0000 mL | ORAL | Status: DC

## 2017-09-03 MED ORDER — VITAL HIGH PROTEIN PO LIQD
1000.0000 mL | ORAL | Status: DC
  Administered 2017-09-03: 1000 mL

## 2017-09-03 MED ORDER — FUROSEMIDE 10 MG/ML IJ SOLN
40.0000 mg | Freq: Once | INTRAMUSCULAR | Status: AC
  Administered 2017-09-03: 40 mg via INTRAVENOUS
  Filled 2017-09-03: qty 4

## 2017-09-03 MED ORDER — ACETAMINOPHEN 325 MG PO TABS
650.0000 mg | ORAL_TABLET | Freq: Four times a day (QID) | ORAL | Status: DC | PRN
  Administered 2017-09-07 – 2017-09-12 (×4): 650 mg
  Filled 2017-09-03 (×4): qty 2

## 2017-09-03 MED ORDER — PRO-STAT SUGAR FREE PO LIQD: 30.0000 mL | Freq: Four times a day (QID) | ORAL | Status: DC

## 2017-09-03 MED ORDER — ACETAMINOPHEN 650 MG RE SUPP
650.0000 mg | Freq: Four times a day (QID) | RECTAL | Status: DC | PRN
Start: 2017-09-03 — End: 2017-09-12
  Administered 2017-09-04 (×2): 650 mg via RECTAL
  Filled 2017-09-03 (×2): qty 1

## 2017-09-03 MED ORDER — ENOXAPARIN SODIUM 40 MG/0.4ML ~~LOC~~ SOLN
40.0000 mg | SUBCUTANEOUS | Status: DC
  Administered 2017-09-03 – 2017-09-04 (×2): 40 mg via SUBCUTANEOUS
  Filled 2017-09-03 (×2): qty 0.4

## 2017-09-03 NOTE — Progress Notes (Signed)
Patient had several episodes of emesis this morning, Dr. Alva Garnet notified and changed tube feeds to 20 mL/hour. Zofran also given. Patient had another episode this afternoon. Dr. Ashby Dawes notified and gave verbal order for KUB, low intermittent sx, hold tube feeds, and hold prostat. Orders placed. Will continue to assess. No signs of aspiration.  Wilnette Kales

## 2017-09-03 NOTE — Progress Notes (Signed)
Nutrition Follow-up  DOCUMENTATION CODES:   Not applicable  INTERVENTION:   Change TFs to Vital HP @ goal rate of 36m/hr  Add Prostat 373mQID  Discontinue free water flushes- tube will get flushed with Prostat administration   Regimen provides 880kcal/day, 102g/day protein, 40116may free water (plus free water flushes with Prostat)  Daily weights  NUTRITION DIAGNOSIS:   Inadequate oral intake related to inability to eat (pt sedated on vent) as evidenced by NPO status.  Ongoing.  GOAL:   Provide needs based on ASPEN/SCCM guidelines  -not meeting as of today; tube feeds decreased as pt vomiting  MONITOR:   Vent status, Labs, Weight trends, I & O's, TF tolerance  ASSESSMENT:   [age] female with PMH of Former Smoker, Neuromuscular Disorder, HTN, Depression, Arthritis, Anxiety, Anemia, and Allergies.  [facility] with c/o fever, cough, malaise, and myalgias onset of symptoms 3 days prior to presentation to ER.  She also states she's had diarrhea for the last 3 months on average 6 stools daily.  In the ER CXR revealed multifocal pneumonia and she met sepsis criteria, therefore iv abx were initiated.  [facility] workup and treatment.  On 09/12 she developed worsening hypoxia, therefore she was placed on a non rebreather mask with O2 sats remaining in the 80's.  She was subsequently placed on continuous Bipap and transferred to ICU 09/12   Pt ventilated. Continues to have have diarrhea (pt admitted with complaint of diarrhea for the past 3 months up to 6x per day). Pt vomited twice this morning (brown liquid). Per MD, will decrease tube feeds to 2m39m. RD will change TF formula and add Prostat to help pt better meet estimated protein needs. Pt with AKI and progressive upward trend of BUN and creatinine. Pt may benefit from decreased protein intake at this point. Pt also with elevated BNP and  20lb weight gain since admit; will hold off on free water flushes for now. Per MD, plan to attempt to extubate soon.      Medications reviewed and include: senokot, colace, lovenox, pepcid, insulin, fentanyl, zofran  Labs reviewed: K 4.9 wnl, Cl 100(L), BUN 78(H), creat 2.32(H), Ca 8.8(L), AlkPhos 175(H), alb 2.2(L), AST 58(H), ALT 77(H) BNP- 1234(H) P 4.6 wnl, Mg 2.4 wnl- 9/16 Hgb 7.9(L), Hct 23.8(L) cbgs- 119, 133, 119 x 48 hrs  Patient is currently intubated on ventilator support MV: 14 L/min Temp (24hrs), Avg:99.4 F (37.4 C), Min:99 F (37.2 C), Max:99.8 F (37.7 C)  Propofol: none   MAP- >60mm3mDiet Order:   NPO  Skin:  Reviewed, no issues  Last BM:  rectal tube in place- pt with continued type 7 stools   Height:   Ht Readings from Last 1 Encounters:  08/26/17 _0  (1.854 m)    Weight:   Wt Readings from Last 1 Encounters:  09/02/17 217 lb 13 oz (98.8 kg)    Ideal Body Weight:  75 kg  BMI:  Body mass index is 28.74 kg/m.  Estimated Nutritional Needs:   Kcal:  1852 3474 2003b w/ MSJ 1578, Ve 9.8, Tmax 99.3)  Protein:  108-125 grams (1.2-1.4 grams/kg)  Fluid:  2.2 L/day (25 ml/kg)  EDUCATION NEEDS:   Education needs no appropriate at this time  CaseyKoleen DistanceRD, LDN PSchoolcraftr #- [phone] Hours Pager: [phone]

## 2017-09-03 NOTE — Progress Notes (Signed)
Anticoagulation Monitoring:   Patient currently ordered enoxparin 30mg  SQ Q24hr. Patient's estimated creatinine clearance is > 52mL/min; will transition patient to enoxaparin 40mg  SQ Q24hr.   Pharmacy will continue to monitor and adjust per protocol.   MLS

## 2017-09-03 NOTE — Progress Notes (Signed)
[place] at [place] NAME: Morgan [name]    MR#:  [id number]  DATE OF BIRTH:  June 19, 1951  SUBJECTIVE: On full vent support.weaning trial in progress with pressure support  Today  also.   CHIEF COMPLAINT:  Was on NRBM , so transferred to ICU, remained on cont bipap, still have worsening in her Xray chest, so decided to intubate 08/29/17.  REVIEW OF SYSTEMS:   Could not provide due to respi failure and intubation.   DRUG ALLERGIES:   Allergies  Allergen Reactions  . Demerol [Meperidine]     Hives   . Strawberry (Diagnostic) Hives    VITALS:  Blood pressure 131/81, pulse 96, temperature 99.9 F (37.7 C), temperature source Oral, resp. rate 16, height 6\' 1"  (1.854 m), weight 98.8 kg (217 lb 13 oz), SpO2 95 %.  PHYSICAL EXAMINATION:  GENERAL:  66 y.o.-year-old patient lying in the bed with no acute distress. Currently intubated orally. EYES: Pupils equal, round, reactive to light . No scleral icterus. Marland Kitchen  HEENT: Head atraumatic, normocephalic. Oropharynx and nasopharynx clear.  NECK:  Supple, no jugular venous distention. No thyroid enlargement, no tenderness.  LUNGS: clear  to auscultation, no wheezing. CARDIOVASCULAR: S1, S2 normal. No murmurs, rubs, or gallops.  ABDOMEN: Soft, nontender, nondistended. Bowel sounds present. No organomegaly or mass.  EXTREMITIES: No pedal edema, cyanosis, or clubbing.  NEUROLOGIC: sedated on ventilator support. PSYCHIATRIC: The patient is sedated.  SKIN: No obvious rash, lesion, or ulcer.      LABORATORY PANEL:   CBC  Recent Labs Lab 09/03/17 0441  WBC 14.6*  HGB 8.0*  HCT 24.2*  PLT 331   ------------------------------------------------------------------------------------------------------------------  Chemistries   Recent Labs Lab 09/01/17 0410  09/03/17 0441  NA 137  < > 143  K 4.1  < > 4.9  CL 96*  < > 100*  CO2 33*  < > 31  GLUCOSE 119*  < > 119*  BUN 58*  < > 78*   CREATININE 2.08*  < > 2.32*  CALCIUM 7.9*  < > 8.8*  MG 2.4  --   --   AST  --   --  58*  ALT  --   --  77*  ALKPHOS  --   --  175*  BILITOT  --   --  0.7  < > = values in this interval not displayed. ------------------------------------------------------------------------------------------------------------------  Cardiac Enzymes  Recent Labs Lab 08/29/17 0451  TROPONINI 0.67*   ------------------------------------------------------------------------------------------------------------------  RADIOLOGY:  Dg Chest Port 1 View  Result Date: 09/03/2017 CLINICAL DATA:  Respiratory failure. EXAM: PORTABLE CHEST 1 VIEW COMPARISON:  09/01/2017.  08/26/2017.  09/18/2012 . FINDINGS: Endotracheal tube, NG tube, right PICC line in stable position. Stable cardiomegaly. Persistent patchy bilateral airspace disease unchanged from prior exam. Underlying chronic interstitial prominence again noted. No pleural effusion or pneumothorax. Biapical pleural thickening noted most likely secondary to scarring. IMPRESSION: 1. Lines and tubes in stable position. 2. Persistent patchy bilateral airspace disease. Underlying chronic interstitial lung disease. 3. Stable cardiomegaly. Electronically Signed   By: Marcello Moores  Register   On: 09/03/2017 06:29    EKG:   Orders placed or performed during the hospital encounter of 08/26/17  . EKG 12-Lead  . EKG 12-Lead  . EKG 12-Lead  . EKG 12-Lead    ASSESSMENT AND PLAN:   * Sepsis ([place]) -Secondary to healthcare associated pneumonia, multilobar pneumonia  IV antibiotics cefepime and vanc, added azithromycin. Lactic acid normal at 1.9 bloodCultures  negative. Sputum cx are not sent.     Acute respiratory failure due to multifocal pneumonia. Status post intubation, continue full ventilator,  bronchodilators, Pulmicort nebulizers. Continue IV antibiotics. Wean as tolerated. Elevated pro-calcitonin levels up to 0.44.His IV diuretics as needed.  hypokalemia replete  and recheck  On full vent support, sedation with fentanyl #  Essential hypertension - continue home med amlodipine     acute kidney injury secondary to sepsis. Continue IV fluids, worsening kidney function. Creatinine up to 2 .20; Monitor closely.  Nutrition: Continue tube feedings, sliding scale insulin with coverage  All the records are reviewed and case discussed with Care Management/Social Workerr. Management plans discussed with the patient, family and they are in agreement.  CODE STATUS: fc   TOTAL TIME TAKING CARE OF THIS PATIENT: 35  minutes.   Note: This dictation was prepared with Dragon dictation along with smaller phrase technology. Any transcriptional errors that result from this process are unintentional.   Jeorgia Helming M.D on 09/03/2017 at 2:08 PM  Between 7am to 6pm - Pager - [phone] After 6pm go to [email or web address] - password [email or web address] [facility]  Office  [phone]  CC: Primary care physician; Javier Docker, MD

## 2017-09-03 NOTE — Progress Notes (Signed)
Name: Morgan [name] MRN: [id number] DOB: 1951/11/04    BRIEF PATIENT DESCRIPTION:  63 F admitted to medsurg unit 09/10 with acute hypoxic respiratory failure secondary to multifocal pneumonia transferred to ICU 09/12 due to worsening respiratory failure requiring continuous Bipap.  Pt required mechanical intubation 09/13.  SIGNIFICANT EVENTS  09/10 admitted to medsurg unit  09/12 transferred to ICU  09/13  intubated   STUDIES:  Echo 09/12>>EF 40%-45%    SUBJECTIVE:  Tolerating PSV 10 cm H2O. Diffusely weak  VITAL SIGNS: Temp:  [99 F (37.2 C)-99.9 F (37.7 C)] 99.9 F (37.7 C) (09/18 1200) Pulse Rate:  [78-108] 96 (09/18 1200) Resp:  [12-33] 16 (09/18 1200) BP: (105-169)/(63-96) 131/81 (09/18 1200) SpO2:  [87 %-97 %] 95 % (09/18 1200) FiO2 (%):  [35 %] 35 % (09/18 0722)  PHYSICAL EXAMINATION: General: Sedated, intubated  Neuro: CNs intact, no focal deficits  HEENT: NCAT, sclerae white  Cardiovascular: Regular, no M Lungs: Clear anteriorly  Abdomen: Mildly distended, soft, + BS Extremities: No edema, warm Skin: intact no rashes or lesions    Recent Labs Lab 09/01/17 0410 09/02/17 0523 09/03/17 0441  NA 137 138 143  K 4.1 4.4 4.9  CL 96* 100* 100*  CO2 33* 31 31  BUN 58* 71* 78*  CREATININE 2.08* 2.20* 2.32*  GLUCOSE 119* 133* 119*    Recent Labs Lab 09/01/17 0410 09/02/17 0523 09/03/17 0441  HGB 7.6* 7.3* 8.0*  HCT 22.4* 22.2* 24.2*  WBC 10.9 11.0 14.6*  PLT 267 260 331   Results for orders placed or performed during the hospital encounter of 08/26/17  C difficile quick scan w PCR reflex     Status: None   Collection Time: 08/26/17  8:37 PM  Result Value Ref Range Status   C Diff antigen NEGATIVE NEGATIVE Final   C Diff toxin NEGATIVE NEGATIVE Final   C Diff interpretation No C. difficile detected.  Final  Culture, blood (Routine X 2) w Reflex to ID Panel     Status: None   Collection Time: 08/26/17  9:48 PM  Result Value Ref Range Status   Specimen Description BLOOD LEFT HAND  Final   Special Requests   Final    BOTTLES DRAWN AEROBIC AND ANAEROBIC Blood Culture adequate volume   Culture NO GROWTH 5 DAYS  Final   Report Status 08/31/2017 FINAL  Final  Culture, blood (Routine X 2) w Reflex to ID Panel     Status: None   Collection Time: 08/26/17  9:48 PM  Result Value Ref Range Status   Specimen Description BLOOD RAC  Final   Special Requests   Final    BOTTLES DRAWN AEROBIC AND ANAEROBIC Blood Culture results may not be optimal due to an excessive volume of blood received in culture bottles   Culture NO GROWTH 5 DAYS  Final   Report Status 08/31/2017 FINAL  Final  MRSA PCR Screening     Status: None   Collection Time: 08/28/17 11:12 AM  Result Value Ref Range Status   MRSA by PCR NEGATIVE NEGATIVE Final    Comment:        The GeneXpert MRSA Assay (FDA approved for NASAL specimens only), is one component of a comprehensive MRSA colonization surveillance program. It is not intended to diagnose MRSA infection nor to guide or monitor treatment for MRSA infections.   Culture, respiratory (NON-Expectorated)     Status: Abnormal   Collection Time: 08/29/17 10:46 PM  Result Value Ref Range Status  Specimen Description SPUTUM  Final   Special Requests NONE  Final   Gram Stain   Final    RARE WBC PRESENT,BOTH PMN AND MONONUCLEAR NO SQUAMOUS EPITHELIAL CELLS SEEN NO ORGANISMS SEEN Performed at [facility], [address], [place], [place] [address]    Culture MULTIPLE ORGANISMS PRESENT, NONE PREDOMINANT (A)  Final   Report Status 09/01/2017 FINAL  Final   Anti-infectives    Start     Dose/Rate Route Frequency Ordered Stop   09/02/17 0900  ceFEPIme (MAXIPIME) 1 g in dextrose 5 % 50 mL IVPB  Status:  Discontinued     1 g 100 mL/hr over 30 Minutes Intravenous Every 24 hours 09/01/17 1514 09/02/17 1118   09/01/17 0645  vancomycin (VANCOCIN) IVPB 1000 mg/200 mL premix     1,000 mg 200 mL/hr over 60 Minutes  Intravenous  Once 09/01/17 0631 09/01/17 1234   08/30/17 2200  ceFEPIme (MAXIPIME) 1 g in dextrose 5 % 50 mL IVPB  Status:  Discontinued     1 g 100 mL/hr over 30 Minutes Intravenous Every 12 hours 08/30/17 1848 09/01/17 1514   08/29/17 1000  vancomycin (VANCOCIN) IVPB 1000 mg/200 mL premix  Status:  Discontinued     1,000 mg 200 mL/hr over 60 Minutes Intravenous Every 8 hours 08/29/17 0923 08/30/17 1848   08/28/17 1230  azithromycin (ZITHROMAX) 500 mg in dextrose 5 % 250 mL IVPB  Status:  Discontinued     500 mg 250 mL/hr over 60 Minutes Intravenous Every 24 hours 08/28/17 1134 09/02/17 1118   08/27/17 0400  vancomycin (VANCOCIN) IVPB 1000 mg/200 mL premix  Status:  Discontinued     1,000 mg 200 mL/hr over 60 Minutes Intravenous Every 8 hours 08/26/17 2226 08/28/17 1416   08/27/17 0400  ceFEPIme (MAXIPIME) 1 g in dextrose 5 % 50 mL IVPB  Status:  Discontinued     1 g 100 mL/hr over 30 Minutes Intravenous Every 8 hours 08/26/17 2226 08/30/17 1848   08/26/17 1915  ceFEPIme (MAXIPIME) 2 g in dextrose 5 % 50 mL IVPB     2 g 100 mL/hr over 30 Minutes Intravenous  Once 08/26/17 1911 08/26/17 2010   08/26/17 1915  vancomycin (VANCOCIN) IVPB 1000 mg/200 mL premix     1,000 mg 200 mL/hr over 60 Minutes Intravenous  Once 08/26/17 1911 08/26/17 2119      CXR: Diffuse bilateral patchy infiltrates with basilar predominance  ASSESSMENT / PLAN: Ventilator dependent respiratory failure Bilateral pulmonary infiltrates - edema versus ARDS Suspected pneumonia, NOS AKI ICU acquired anemia without overt blood loss ICU/vent associated discomfort  Cont vent support - settings reviewed and/or adjusted Continue to wean in PSV mode as tolerated Cont vent bundle Daily SBT if/when meets criteria Hopefully extubate 09/19 Monitor temp, WBC count Micro and abx as above - antibiotics discontinued 09/17 Cont diuresis as permitted by BP and renal function Monitor BMET intermittently Monitor I/Os Correct  electrolytes as indicated  PAD protocol with daily WUA DVT px: Enoxaparin Monitor CBC intermittently Transfuse per usual guidelines   CCM time: 30 mins The above time includes time spent in consultation with patient and/or family members and reviewing care plan on multidisciplinary rounds  I will contact patient's family today  Merton Border, MD PCCM service Mobile [phone] Pager [phone] 09/03/2017 2:22 PM

## 2017-09-04 ENCOUNTER — Inpatient Hospital Stay: Payer: Medicare HMO

## 2017-09-04 DIAGNOSIS — G9340 Encephalopathy, unspecified: Secondary | ICD-10-CM

## 2017-09-04 LAB — BASIC METABOLIC PANEL
ANION GAP: 12 (ref 5–15)
BUN: 78 mg/dL — ABNORMAL HIGH (ref 6–20)
CO2: 36 mmol/L — ABNORMAL HIGH (ref 22–32)
CREATININE: 2.23 mg/dL — AB (ref 0.44–1.00)
Calcium: 9 mg/dL (ref 8.9–10.3)
Chloride: 101 mmol/L (ref 101–111)
GFR calc non Af Amer: 22 mL/min — ABNORMAL LOW (ref >60)
GFR, EST AFRICAN AMERICAN: 26 mL/min — AB (ref >60)
Glucose, Bld: 115 mg/dL — ABNORMAL HIGH (ref 65–99)
Potassium: 5.3 mmol/L — ABNORMAL HIGH (ref 3.5–5.1)
SODIUM: 149 mmol/L — AB (ref 135–145)

## 2017-09-04 LAB — GLUCOSE, CAPILLARY
GLUCOSE-CAPILLARY: 117 mg/dL — AB (ref 65–99)
GLUCOSE-CAPILLARY: 123 mg/dL — AB (ref 65–99)
GLUCOSE-CAPILLARY: 141 mg/dL — AB (ref 65–99)
Glucose-Capillary: 101 mg/dL — ABNORMAL HIGH (ref 65–99)
Glucose-Capillary: 117 mg/dL — ABNORMAL HIGH (ref 65–99)
Glucose-Capillary: 121 mg/dL — ABNORMAL HIGH (ref 65–99)
Glucose-Capillary: 136 mg/dL — ABNORMAL HIGH (ref 65–99)

## 2017-09-04 LAB — CBC
HCT: 24.8 % — ABNORMAL LOW (ref 35.0–47.0)
Hemoglobin: 8.2 g/dL — ABNORMAL LOW (ref 12.0–16.0)
MCH: 26.8 pg (ref 26.0–34.0)
MCHC: 33.3 g/dL (ref 32.0–36.0)
MCV: 80.7 fL (ref 80.0–100.0)
PLATELETS: 402 10*3/uL (ref 150–440)
RBC: 3.07 MIL/uL — AB (ref 3.80–5.20)
RDW: 15.2 % — ABNORMAL HIGH (ref 11.5–14.5)
WBC: 13.8 10*3/uL — AB (ref 3.6–11.0)

## 2017-09-04 MED ORDER — HYDRALAZINE HCL 20 MG/ML IJ SOLN
INTRAMUSCULAR | Status: AC
  Administered 2017-09-04: 10 mg via INTRAVENOUS
  Filled 2017-09-04: qty 1

## 2017-09-04 MED ORDER — FUROSEMIDE 10 MG/ML IJ SOLN
40.0000 mg | Freq: Once | INTRAMUSCULAR | Status: AC
  Administered 2017-09-04: 40 mg via INTRAVENOUS
  Filled 2017-09-04: qty 4

## 2017-09-04 MED ORDER — DEXTROSE 5 % IV SOLN
INTRAVENOUS | Status: DC
  Administered 2017-09-04 – 2017-09-05 (×2): via INTRAVENOUS

## 2017-09-04 MED ORDER — ORAL CARE MOUTH RINSE
15.0000 mL | Freq: Two times a day (BID) | OROMUCOSAL | Status: DC
  Administered 2017-09-04 – 2017-09-12 (×15): 15 mL via OROMUCOSAL

## 2017-09-04 MED ORDER — HYDRALAZINE HCL 20 MG/ML IJ SOLN
10.0000 mg | INTRAMUSCULAR | Status: DC | PRN
  Administered 2017-09-04 (×2): 10 mg via INTRAVENOUS
  Filled 2017-09-04: qty 1

## 2017-09-04 NOTE — Progress Notes (Signed)
RT notified patient needs to go on BiPap d/t tachypnea and O2 sats in high 80s, WOB increased

## 2017-09-04 NOTE — Progress Notes (Signed)
[place] at [place] NAME: Morgan [name]    MR#:  [id number]  DATE OF BIRTH:  Jan 26, 1951  SUBJECTIVE: Extubated today morning, now on 5 L of oxygen. However still unresponsive even off sedation.   CHIEF COMPLAINT:  Was on NRBM , so transferred to ICU, remained on cont bipap, still have worsening in her Xray chest, so decided to intubate 08/29/17.  REVIEW OF SYSTEMS:   Could not provide due to respi failure and intubation.   DRUG ALLERGIES:   Allergies  Allergen Reactions  . Demerol [Meperidine]     Hives   . Strawberry (Diagnostic) Hives    VITALS:  Blood pressure (!) 157/89, pulse (!) 102, temperature 100.2 F (37.9 C), temperature source Axillary, resp. rate (!) 26, height 6\' 1"  (1.854 m), weight 95.7 kg (210 lb 15.7 oz), SpO2 97 %.  PHYSICAL EXAMINATION:  GENERAL:  66 y.o.-year-old patient lying in the bed with no acute distress. And minimally responsive, opens eyes to calling her name. EYES: Pupils equal, round, reactive to light . No scleral icterus. Marland Kitchen  HEENT: Head atraumatic, normocephalic. Oropharynx and nasopharynx clear.  NECK:  Supple, no jugular venous distention. No thyroid enlargement, no tenderness.  LUNGS: clear  to auscultation, no wheezing. CARDIOVASCULAR: S1, S2 normal. No murmurs, rubs, or gallops.  ABDOMEN: Soft, nontender, nondistended. Bowel sounds present. No organomegaly or mass.  EXTREMITIES: No pedal edema, cyanosis, or clubbing.  NEUROLOGIC: off sedation but poorly responsive.Marland Kitchen PSYCHIATRIC: The patient is sedated.  SKIN: No obvious rash, lesion, or ulcer.      LABORATORY PANEL:   CBC  Recent Labs Lab 09/04/17 0538  WBC 13.8*  HGB 8.2*  HCT 24.8*  PLT 402   ------------------------------------------------------------------------------------------------------------------  Chemistries   Recent Labs Lab 09/01/17 0410  09/03/17 0441 09/04/17 0538  NA 137  < > 143 149*  K 4.1  < > 4.9  5.3*  CL 96*  < > 100* 101  CO2 33*  < > 31 36*  GLUCOSE 119*  < > 119* 115*  BUN 58*  < > 78* 78*  CREATININE 2.08*  < > 2.32* 2.23*  CALCIUM 7.9*  < > 8.8* 9.0  MG 2.4  --   --   --   AST  --   --  58*  --   ALT  --   --  77*  --   ALKPHOS  --   --  175*  --   BILITOT  --   --  0.7  --   < > = values in this interval not displayed. ------------------------------------------------------------------------------------------------------------------  Cardiac Enzymes  Recent Labs Lab 08/29/17 0451  TROPONINI 0.67*   ------------------------------------------------------------------------------------------------------------------  RADIOLOGY:  Dg Chest Port 1 View  Result Date: 09/04/2017 CLINICAL DATA:  Respiratory failure EXAM: PORTABLE CHEST 1 VIEW COMPARISON:  09/03/2017 FINDINGS: Support devices are stable. Bibasilar atelectasis and diffuse interstitial and alveolar opacities, slightly improved since prior study. Heart is borderline in size. IMPRESSION: Improving bilateral airspace opacities. Electronically Signed   By: Rolm Baptise M.D.   On: 09/04/2017 08:22   Dg Chest Port 1 View  Result Date: 09/03/2017 CLINICAL DATA:  Respiratory failure. EXAM: PORTABLE CHEST 1 VIEW COMPARISON:  09/01/2017.  08/26/2017.  09/18/2012 . FINDINGS: Endotracheal tube, NG tube, right PICC line in stable position. Stable cardiomegaly. Persistent patchy bilateral airspace disease unchanged from prior exam. Underlying chronic interstitial prominence again noted. No pleural effusion or pneumothorax. Biapical pleural thickening noted most likely  secondary to scarring. IMPRESSION: 1. Lines and tubes in stable position. 2. Persistent patchy bilateral airspace disease. Underlying chronic interstitial lung disease. 3. Stable cardiomegaly. Electronically Signed   By: Marcello Moores  Register   On: 09/03/2017 06:29   Dg Abd Portable 1v  Result Date: 09/03/2017 CLINICAL DATA:  Abdominal distention, vomiting EXAM:  PORTABLE ABDOMEN - 1 VIEW COMPARISON:  Abdomen films of 08/29/2017 FINDINGS: Supine views of the abdomen show knee NG tube present with the tip in the proximal body the stomach. No bowel obstruction is seen. Faint calcifications are present in the right upper quadrant local which could represent gallstones although right renal calculi cannot be excluded. There is a circular opacity within the mid pelvis which most likely is due to prior surgery, possibly an atypical pessary. Correlate clinically and surgically. IMPRESSION: 1. No bowel obstruction.  NG tube tip in proximal body of stomach. 2. Cannot exclude small gallstones and/or right renal calculi. 3. Unusual rounded opacity in the pelvis. Possible atypical pessary. Correlate clinically. Electronically Signed   By: Ivar Drape M.D.   On: 09/03/2017 15:56    EKG:   Orders placed or performed during the hospital encounter of 08/26/17  . EKG 12-Lead  . EKG 12-Lead  . EKG 12-Lead  . EKG 12-Lead    ASSESSMENT AND PLAN:   * Sepsis ([place]) -Secondary to healthcare associated pneumonia, multilobar pneumonia  IV antibiotics cefepime and vanc, added azithromycin. Lactic acid normal at 1.9 bloodCultures negative.     Acute respiratory failure due to multifocal pneumonia. Status post intubation,/Extubation today.  bronchodilators, Pulmicort nebulizers. Continue IV antibiotics.  Elevated pro-calcitonin levels up to 0.44.His IV diuretics as needed.  hypokalemia replete and recheck   #  Essential hypertension - continue home med amlodipine     acute kidney injury secondary to sepsis. Continue IV fluids,And function slightly better.  Nutrition: Continue tube feedings, sliding scale insulin with coverage  Unresponsiveness even after sedation is turned off, extubated ; ct head in a day if still unresponsive  All the records are reviewed and case discussed with Care Management/Social Workerr. Management plans discussed with the patient, family and they  are in agreement.  CODE STATUS: fc   TOTAL TIME TAKING CARE OF THIS PATIENT: 35  minutes.   Note: This dictation was prepared with Dragon dictation along with smaller phrase technology. Any transcriptional errors that result from this process are unintentional.   Ylonda Storr M.D on 09/04/2017 at 12:44 PM  Between 7am to 6pm - Pager - [phone] After 6pm go to [email or web address] - password [email or web address] [facility]  Office  [phone]  CC: Primary care physician; Javier Docker, MD

## 2017-09-04 NOTE — Progress Notes (Signed)
Name: Morgan [name] MRN: [id number] DOB: 03/18/1951    BRIEF PATIENT DESCRIPTION:  55 F admitted to medsurg unit 09/10 with acute hypoxic respiratory failure secondary to multifocal pneumonia transferred to ICU 09/12 due to worsening respiratory failure requiring continuous Bipap.  Pt required mechanical intubation 09/13.  SIGNIFICANT EVENTS  09/10 admitted to medsurg unit  09/12 transferred to ICU  09/13  intubated   STUDIES:  Echo 09/12>>EF 40%-45% CT head 09/19 >>    SUBJECTIVE:  Lethargic but passed SBT this a.m. extubated and tolerating well. However, remains minimally responsive with a glassy eyed stare. Therefore CT head ordered.  VITAL SIGNS: Temp:  [99.1 F (37.3 C)-100.2 F (37.9 C)] 99.1 F (37.3 C) (09/19 1600) Pulse Rate:  [82-113] 102 (09/19 1200) Resp:  [12-31] 26 (09/19 1200) BP: (123-174)/(68-105) 157/89 (09/19 1200) SpO2:  [94 %-97 %] 97 % (09/19 1200) FiO2 (%):  [35 %] 35 % (09/19 0800) Weight:  [95.7 kg (210 lb 15.7 oz)] 95.7 kg (210 lb 15.7 oz) (09/19 0800)  PHYSICAL EXAMINATION: General: NAD post extubation. Staring blankly. Not F/C Neuro: CNs intact, minimal spontaneous movement. Tone normal. DTRs symmetric. Minimal withdrawal from pain. HEENT: NCAT, sclerae white  Cardiovascular: Regular, no M Lungs: Clear anteriorly  Abdomen: Mildly distended, soft, + BS Extremities: No edema, warm Skin: intact no rashes or lesions    Recent Labs Lab 09/02/17 0523 09/03/17 0441 09/04/17 0538  NA 138 143 149*  K 4.4 4.9 5.3*  CL 100* 100* 101  CO2 31 31 36*  BUN 71* 78* 78*  CREATININE 2.20* 2.32* 2.23*  GLUCOSE 133* 119* 115*    Recent Labs Lab 09/02/17 0523 09/03/17 0441 09/04/17 0538  HGB 7.3* 8.0* 8.2*  HCT 22.2* 24.2* 24.8*  WBC 11.0 14.6* 13.8*  PLT 260 331 402   Results for orders placed or performed during the hospital encounter of 08/26/17  C difficile quick scan w PCR reflex     Status: None   Collection Time: 08/26/17  8:37 PM    Result Value Ref Range Status   C Diff antigen NEGATIVE NEGATIVE Final   C Diff toxin NEGATIVE NEGATIVE Final   C Diff interpretation No C. difficile detected.  Final  Culture, blood (Routine X 2) w Reflex to ID Panel     Status: None   Collection Time: 08/26/17  9:48 PM  Result Value Ref Range Status   Specimen Description BLOOD LEFT HAND  Final   Special Requests   Final    BOTTLES DRAWN AEROBIC AND ANAEROBIC Blood Culture adequate volume   Culture NO GROWTH 5 DAYS  Final   Report Status 08/31/2017 FINAL  Final  Culture, blood (Routine X 2) w Reflex to ID Panel     Status: None   Collection Time: 08/26/17  9:48 PM  Result Value Ref Range Status   Specimen Description BLOOD RAC  Final   Special Requests   Final    BOTTLES DRAWN AEROBIC AND ANAEROBIC Blood Culture results may not be optimal due to an excessive volume of blood received in culture bottles   Culture NO GROWTH 5 DAYS  Final   Report Status 08/31/2017 FINAL  Final  MRSA PCR Screening     Status: None   Collection Time: 08/28/17 11:12 AM  Result Value Ref Range Status   MRSA by PCR NEGATIVE NEGATIVE Final    Comment:        The GeneXpert MRSA Assay (FDA approved for NASAL specimens only), is one  component of a comprehensive MRSA colonization surveillance program. It is not intended to diagnose MRSA infection nor to guide or monitor treatment for MRSA infections.   Culture, respiratory (NON-Expectorated)     Status: Abnormal   Collection Time: 08/29/17 10:46 PM  Result Value Ref Range Status   Specimen Description SPUTUM  Final   Special Requests NONE  Final   Gram Stain   Final    RARE WBC PRESENT,BOTH PMN AND MONONUCLEAR NO SQUAMOUS EPITHELIAL CELLS SEEN NO ORGANISMS SEEN Performed at [facility], [address], [place], [place] [address]    Culture MULTIPLE ORGANISMS PRESENT, NONE PREDOMINANT (A)  Final   Report Status 09/01/2017 FINAL  Final   Anti-infectives    Start     Dose/Rate Route  Frequency Ordered Stop   09/02/17 0900  ceFEPIme (MAXIPIME) 1 g in dextrose 5 % 50 mL IVPB  Status:  Discontinued     1 g 100 mL/hr over 30 Minutes Intravenous Every 24 hours 09/01/17 1514 09/02/17 1118   09/01/17 0645  vancomycin (VANCOCIN) IVPB 1000 mg/200 mL premix     1,000 mg 200 mL/hr over 60 Minutes Intravenous  Once 09/01/17 0631 09/01/17 1234   08/30/17 2200  ceFEPIme (MAXIPIME) 1 g in dextrose 5 % 50 mL IVPB  Status:  Discontinued     1 g 100 mL/hr over 30 Minutes Intravenous Every 12 hours 08/30/17 1848 09/01/17 1514   08/29/17 1000  vancomycin (VANCOCIN) IVPB 1000 mg/200 mL premix  Status:  Discontinued     1,000 mg 200 mL/hr over 60 Minutes Intravenous Every 8 hours 08/29/17 0923 08/30/17 1848   08/28/17 1230  azithromycin (ZITHROMAX) 500 mg in dextrose 5 % 250 mL IVPB  Status:  Discontinued     500 mg 250 mL/hr over 60 Minutes Intravenous Every 24 hours 08/28/17 1134 09/02/17 1118   08/27/17 0400  vancomycin (VANCOCIN) IVPB 1000 mg/200 mL premix  Status:  Discontinued     1,000 mg 200 mL/hr over 60 Minutes Intravenous Every 8 hours 08/26/17 2226 08/28/17 1416   08/27/17 0400  ceFEPIme (MAXIPIME) 1 g in dextrose 5 % 50 mL IVPB  Status:  Discontinued     1 g 100 mL/hr over 30 Minutes Intravenous Every 8 hours 08/26/17 2226 08/30/17 1848   08/26/17 1915  ceFEPIme (MAXIPIME) 2 g in dextrose 5 % 50 mL IVPB     2 g 100 mL/hr over 30 Minutes Intravenous  Once 08/26/17 1911 08/26/17 2010   08/26/17 1915  vancomycin (VANCOCIN) IVPB 1000 mg/200 mL premix     1,000 mg 200 mL/hr over 60 Minutes Intravenous  Once 08/26/17 1911 08/26/17 2119      CXR: Slight improvement in patchy bilateral infiltrates with basilar predominance  ASSESSMENT / PLAN: Ventilator dependent respiratory failure Bilateral pulmonary infiltrates - suspect components of both edema versus ARDS  Improving Suspected pneumonia, NOS - now fully treated AKI - nonoliguric. Creatinine improving Hypernatremia ICU  acquired anemia without overt blood loss Acute encephalopathy, hypoactive delirium  Supplemental nasal cannula oxygen as needed to maintain SPO2 >92% Monitor temp, WBC count Micro and abx as above - antibiotics discontinued 09/17 Hold further diuresis Monitor BMET intermittently Monitor I/Os Correct electrolytes as indicated  D5W initiated 09/19 DVT px: Enoxaparin Monitor CBC intermittently CT head ordered 09/19  CCM time: 30 mins The above time includes time spent in consultation with patient and/or family members and reviewing care plan on multidisciplinary rounds  I will contact patient's family today  Merton Border, MD PCCM service Mobile [phone] Pager [phone] 09/04/2017 4:16 PM

## 2017-09-04 NOTE — Progress Notes (Addendum)
Dr Alva Garnet notified of K 5.3 and hypertension, we will give IV lasix

## 2017-09-04 NOTE — Progress Notes (Signed)
CH made a follow up visit. Pt is extubated and breathing on her own. Sister is bedside. She stated that they are waiting for the sedation to leave her system for a better assessment. [place] provided prayer and will follow up as needed.    09/04/17 1200  Clinical Encounter Type  Visited With Health care provider  Visit Type Initial;Spiritual support  Referral From Nurse  Consult/Referral To Chaplain  Spiritual Encounters  Spiritual Needs Prayer

## 2017-09-04 NOTE — Progress Notes (Signed)
NP Hinton Dyer notified of unrelieved HPTN, pt on BiPap for increased WOB,  She will assess patient's hypertension when she returns to the unit

## 2017-09-04 NOTE — Progress Notes (Signed)
Patient is extubated and placed on 5 lpm O2 [place]

## 2017-09-04 NOTE — Progress Notes (Signed)
Dr Alva Garnet alerted that writer finds OG tube at 45 cm, was supposed to be at 67 cms per report.  We will probably extubate pt, OG is currently to LIS.  Do not advance at this time

## 2017-09-04 NOTE — Progress Notes (Signed)
CH offered silent prayer as the Pt is unavailable.    09/04/17 1200  Clinical Encounter Type  Visited With Health care provider  Visit Type Initial;Spiritual support  Referral From Nurse  Consult/Referral To Chaplain  Spiritual Encounters  Spiritual Needs Prayer

## 2017-09-05 ENCOUNTER — Inpatient Hospital Stay: Payer: Medicare HMO

## 2017-09-05 LAB — CBC
HCT: 26.4 % — ABNORMAL LOW (ref 35.0–47.0)
Hemoglobin: 8.6 g/dL — ABNORMAL LOW (ref 12.0–16.0)
MCH: 26.1 pg (ref 26.0–34.0)
MCHC: 32.5 g/dL (ref 32.0–36.0)
MCV: 80.4 fL (ref 80.0–100.0)
Platelets: 450 10*3/uL — ABNORMAL HIGH (ref 150–440)
RBC: 3.28 MIL/uL — ABNORMAL LOW (ref 3.80–5.20)
RDW: 14.9 % — AB (ref 11.5–14.5)
WBC: 15.6 10*3/uL — AB (ref 3.6–11.0)

## 2017-09-05 LAB — BASIC METABOLIC PANEL
ANION GAP: 12 (ref 5–15)
Anion gap: 11 (ref 5–15)
BUN: 74 mg/dL — AB (ref 6–20)
BUN: 76 mg/dL — ABNORMAL HIGH (ref 6–20)
CALCIUM: 8.9 mg/dL (ref 8.9–10.3)
CHLORIDE: 93 mmol/L — AB (ref 101–111)
CO2: 36 mmol/L — AB (ref 22–32)
CO2: 39 mmol/L — AB (ref 22–32)
Calcium: 8.4 mg/dL — ABNORMAL LOW (ref 8.9–10.3)
Chloride: 99 mmol/L — ABNORMAL LOW (ref 101–111)
Creatinine, Ser: 1.9 mg/dL — ABNORMAL HIGH (ref 0.44–1.00)
Creatinine, Ser: 2.01 mg/dL — ABNORMAL HIGH (ref 0.44–1.00)
GFR calc Af Amer: 31 mL/min — ABNORMAL LOW (ref >60)
GFR calc Af Amer: 29 mL/min — ABNORMAL LOW (ref >60)
GFR calc non Af Amer: 27 mL/min — ABNORMAL LOW (ref >60)
GFR calc non Af Amer: 25 mL/min — ABNORMAL LOW (ref >60)
GLUCOSE: 412 mg/dL — AB (ref 65–99)
GLUCOSE: 131 mg/dL — AB (ref 65–99)
POTASSIUM: 3.5 mmol/L (ref 3.5–5.1)
Potassium: 3.7 mmol/L (ref 3.5–5.1)
Sodium: 140 mmol/L (ref 135–145)
Sodium: 150 mmol/L — ABNORMAL HIGH (ref 135–145)

## 2017-09-05 LAB — GLUCOSE, CAPILLARY
GLUCOSE-CAPILLARY: 140 mg/dL — AB (ref 65–99)
Glucose-Capillary: 139 mg/dL — ABNORMAL HIGH (ref 65–99)
Glucose-Capillary: 131 mg/dL — ABNORMAL HIGH (ref 65–99)
Glucose-Capillary: 114 mg/dL — ABNORMAL HIGH (ref 65–99)

## 2017-09-05 MED ORDER — POTASSIUM CL IN DEXTROSE 5% 20 MEQ/L IV SOLN
20.0000 meq | INTRAVENOUS | Status: DC
  Administered 2017-09-05: 20 meq via INTRAVENOUS
  Filled 2017-09-05 (×3): qty 1000

## 2017-09-05 MED ORDER — SODIUM CHLORIDE 0.9 % IV SOLN: 1.0000 mg/h | INTRAVENOUS | Status: DC

## 2017-09-05 MED ORDER — POTASSIUM CHLORIDE 2 MEQ/ML IV SOLN
INTRAVENOUS | Status: DC
  Filled 2017-09-05: qty 1000

## 2017-09-05 MED ORDER — ENOXAPARIN SODIUM 40 MG/0.4ML ~~LOC~~ SOLN
40.0000 mg | SUBCUTANEOUS | Status: DC
  Administered 2017-09-05 – 2017-09-12 (×8): 40 mg via SUBCUTANEOUS
  Filled 2017-09-05 (×8): qty 0.4

## 2017-09-05 MED ORDER — MORPHINE SULFATE (PF) 2 MG/ML IV SOLN: 2.0000 mg | INTRAVENOUS | Status: DC | PRN

## 2017-09-05 NOTE — Progress Notes (Signed)
[place] at [place] NAME: Morgan [name]    MR#:  [id number]  DATE OF BIRTH:  [birth year]  SUBJECTIVE: Extubated yesterday, more alert today.   CHIEF COMPLAINT:  Was on NRBM , so transferred to ICU, remained on cont bipap, still have worsening in her Xray chest, so decided to intubate 08/29/17.  REVIEW OF SYSTEMS:   Could not provide due to respi failure and intubation.   DRUG ALLERGIES:   Allergies  Allergen Reactions  . Demerol [Meperidine]     Hives   . Strawberry (Diagnostic) Hives    VITALS:  Blood pressure (!) 147/81, pulse 91, temperature 98.8 F (37.1 C), temperature source Oral, resp. rate (!) 25, height 6\' 1"  (1.854 m), weight 90.8 kg (200 lb 2.8 oz), SpO2 98 %.  PHYSICAL EXAMINATION:  GENERAL:  66 y.o.-year-old patient lying in the bed with no acute distress. More alert, opens eyes to calling her name. EYES: Pupils equal, round, reactive to light . No scleral icterus. Marland Kitchen  HEENT: Head atraumatic, normocephalic. Oropharynx and nasopharynx clear.  NECK:  Supple, no jugular venous distention. No thyroid enlargement, no tenderness.  LUNGS: clear  to auscultation, no wheezing. CARDIOVASCULAR: S1, S2 normal. No murmurs, rubs, or gallops.  ABDOMEN: Soft, nontender, nondistended. Bowel sounds present. No organomegaly or mass.  EXTREMITIES: No pedal edema, cyanosis, or clubbing.  NEUROLOGIC: off sedation but poorly responsive.Marland Kitchen PSYCHIATRIC: The patient is sedated.  SKIN: No obvious rash, lesion, or ulcer.      LABORATORY PANEL:   CBC  Recent Labs Lab 09/05/17 1006  WBC 15.6*  HGB 8.6*  HCT 26.4*  PLT 450*   ------------------------------------------------------------------------------------------------------------------  Chemistries   Recent Labs Lab 09/01/17 0410  09/03/17 0441  09/05/17 1108  NA 137  < > 143  < > 150*  K 4.1  < > 4.9  < > 3.7  CL 96*  < > 100*  < > 99*  CO2 33*  < > 31  < > 39*  GLUCOSE  119*  < > 119*  < > 131*  BUN 58*  < > 78*  < > 76*  CREATININE 2.08*  < > 2.32*  < > 2.01*  CALCIUM 7.9*  < > 8.8*  < > 8.9  MG 2.4  --   --   --   --   AST  --   --  58*  --   --   ALT  --   --  77*  --   --   ALKPHOS  --   --  175*  --   --   BILITOT  --   --  0.7  --   --   < > = values in this interval not displayed. ------------------------------------------------------------------------------------------------------------------  Cardiac Enzymes No results for input(s): TROPONINI in the last 168 hours. ------------------------------------------------------------------------------------------------------------------  RADIOLOGY:  Ct Head Wo Contrast  Result Date: 09/04/2017 CLINICAL DATA:  Altered mental status. EXAM: CT HEAD WITHOUT CONTRAST TECHNIQUE: Contiguous axial images were obtained from the base of the skull through the vertex without intravenous contrast. COMPARISON:  None. FINDINGS: Brain: No evidence of acute infarction, hemorrhage, hydrocephalus, extra-axial collection or mass lesion/mass effect. Vascular: No hyperdense vessel or unexpected calcification. Skull: Normal. Negative for fracture or focal lesion. Sinuses/Orbits: No acute finding. Other: None. IMPRESSION: Normal head CT. Electronically Signed   By: Marijo Conception, M.D.   On: 09/04/2017 16:34   Dg Chest Port 1 View  Result Date:  09/04/2017 CLINICAL DATA:  Respiratory failure EXAM: PORTABLE CHEST 1 VIEW COMPARISON:  09/03/2017 FINDINGS: Support devices are stable. Bibasilar atelectasis and diffuse interstitial and alveolar opacities, slightly improved since prior study. Heart is borderline in size. IMPRESSION: Improving bilateral airspace opacities. Electronically Signed   By: Rolm Baptise M.D.   On: 09/04/2017 08:22   Dg Abd Portable 1v  Result Date: 09/03/2017 CLINICAL DATA:  Abdominal distention, vomiting EXAM: PORTABLE ABDOMEN - 1 VIEW COMPARISON:  Abdomen films of 08/29/2017 FINDINGS: Supine views of the  abdomen show knee NG tube present with the tip in the proximal body the stomach. No bowel obstruction is seen. Faint calcifications are present in the right upper quadrant local which could represent gallstones although right renal calculi cannot be excluded. There is a circular opacity within the mid pelvis which most likely is due to prior surgery, possibly an atypical pessary. Correlate clinically and surgically. IMPRESSION: 1. No bowel obstruction.  NG tube tip in proximal body of stomach. 2. Cannot exclude small gallstones and/or right renal calculi. 3. Unusual rounded opacity in the pelvis. Possible atypical pessary. Correlate clinically. Electronically Signed   By: Ivar Drape M.D.   On: 09/03/2017 15:56    EKG:   Orders placed or performed during the hospital encounter of 08/26/17  . EKG 12-Lead  . EKG 12-Lead  . EKG 12-Lead  . EKG 12-Lead    ASSESSMENT AND PLAN:   * Sepsis ([place]) -Secondary to healthcare associated pneumonia, multilobar pneumonia  CBC is still up at 15.6. Chest x-ray  yesterdayshowed improving pneumonia. Lactic acid normal at 1.9 bloodCultures negative.     Acute respiratory failure due to multifocal pneumonia. Status post intubation,/Extubation .  bronchodilators, Pulmicort nebulizers. Continue IV antibiotics.  Elevated pro-calcitonin levels up to 0.44.His IV diuretics as needed.  hypokalemia Improved. Now has hyponatremia: Continue D5 containing IV fluids, recheck sodium tomorrow #  Essential hypertension - continue home med amlodipine     acute kidney injury secondary to sepsis. Continue IV fluids,  Nutrition: Continue tube feedings, sliding scale insulin with coverage  The unresponsiveness: CT head is normal. MRI of the brain pending, patient more alert, awake today.  All the records are reviewed and case discussed with Care Management/Social Workerr. Management plans discussed with the patient, family and they are in agreement.  CODE STATUS: fc   TOTAL  TIME TAKING CARE OF THIS PATIENT: 35  minutes.   Note: This dictation was prepared with Dragon dictation along with smaller phrase technology. Any transcriptional errors that result from this process are unintentional.   Roye Gustafson M.D on 09/05/2017 at 2:41 PM  Between 7am to 6pm - Pager - [phone] After 6pm go to [email or web address] - password [email or web address] [facility]  Office  [phone]  CC: Primary care physician; Javier Docker, MD

## 2017-09-05 NOTE — Progress Notes (Signed)
Anticoagulation monitoring(Lovenox):  66yo  female ordered Lovenox 30 mg Q24h  Filed Weights   09/02/17 0500 09/04/17 0800 09/05/17 0436  Weight: 217 lb 13 oz (98.8 kg) 210 lb 15.7 oz (95.7 kg) 200 lb 2.8 oz (90.8 kg)   BMI 26.5   Lab Results  Component Value Date   CREATININE 2.23 (H) 09/04/2017   CREATININE 2.32 (H) 09/03/2017   CREATININE 2.20 (H) 09/02/2017   Estimated Creatinine Clearance: 32.8 mL/min (A) (by C-G formula based on SCr of 2.23 mg/dL (H)). Hemoglobin & Hematocrit     Component Value Date/Time   HGB 8.2 (L) 09/04/2017 0538   HGB 12.4 09/18/2012 2000   HCT 24.8 (L) 09/04/2017 0538   HCT 37.3 09/18/2012 2000     Per Protocol for Patient with estCrcl > 30 ml/min and BMI < 40, will transition to Lovenox 40 mg Q24h.

## 2017-09-05 NOTE — Progress Notes (Signed)
Name: Morgan [name] MRN: [id number] DOB: [birth year]    BRIEF PATIENT DESCRIPTION:  18 F admitted to medsurg unit 09/10 with acute hypoxic respiratory failure secondary to multifocal pneumonia transferred to ICU 09/12 due to worsening respiratory failure requiring continuous Bipap.  Pt required mechanical intubation 09/13.  SIGNIFICANT EVENTS  09/10 admitted to medsurg unit  09/12 transferred to ICU  09/13  intubated   STUDIES:  Echo 09/12>>EF 40%-45% CT head 09/19 >> MRI Head 09/20>>  SUBJECTIVE:  Pt lethargic gazing towards the right   VITAL SIGNS: Temp:  [98.8 F (37.1 C)-100.2 F (37.9 C)] 98.8 F (37.1 C) (09/20 0800) Pulse Rate:  [81-111] 91 (09/20 0900) Resp:  [20-45] 25 (09/20 0900) BP: (123-174)/(67-112) 147/81 (09/20 0900) SpO2:  [91 %-100 %] 98 % (09/20 0900) FiO2 (%):  [40 %-50 %] 40 % (09/20 0306) Weight:  [90.8 kg (200 lb 2.8 oz)] 90.8 kg (200 lb 2.8 oz) (09/20 0436)  PHYSICAL EXAMINATION: General: NAD post extubation. Staring blankly. Not F/C Neuro: alert to self only, left hand grip weak, right hand grip strong, DTRs symmetric  HEENT: NCAT, sclerae white  Cardiovascular: Regular, no M Lungs: diminished throughout, even, non labored   Abdomen: Mildly distended, soft, + BS x4 Extremities: No edema, warm Skin: intact no rashes or lesions    Recent Labs Lab 09/02/17 0523 09/03/17 0441 09/04/17 0538  NA 138 143 149*  K 4.4 4.9 5.3*  CL 100* 100* 101  CO2 31 31 36*  BUN 71* 78* 78*  CREATININE 2.20* 2.32* 2.23*  GLUCOSE 133* 119* 115*    Recent Labs Lab 09/02/17 0523 09/03/17 0441 09/04/17 0538  HGB 7.3* 8.0* 8.2*  HCT 22.2* 24.2* 24.8*  WBC 11.0 14.6* 13.8*  PLT 260 331 402   Results for orders placed or performed during the hospital encounter of 08/26/17  C difficile quick scan w PCR reflex     Status: None   Collection Time: 08/26/17  8:37 PM  Result Value Ref Range Status   C Diff antigen NEGATIVE NEGATIVE Final   C Diff toxin  NEGATIVE NEGATIVE Final   C Diff interpretation No C. difficile detected.  Final  Culture, blood (Routine X 2) w Reflex to ID Panel     Status: None   Collection Time: 08/26/17  9:48 PM  Result Value Ref Range Status   Specimen Description BLOOD LEFT HAND  Final   Special Requests   Final    BOTTLES DRAWN AEROBIC AND ANAEROBIC Blood Culture adequate volume   Culture NO GROWTH 5 DAYS  Final   Report Status 08/31/2017 FINAL  Final  Culture, blood (Routine X 2) w Reflex to ID Panel     Status: None   Collection Time: 08/26/17  9:48 PM  Result Value Ref Range Status   Specimen Description BLOOD RAC  Final   Special Requests   Final    BOTTLES DRAWN AEROBIC AND ANAEROBIC Blood Culture results may not be optimal due to an excessive volume of blood received in culture bottles   Culture NO GROWTH 5 DAYS  Final   Report Status 08/31/2017 FINAL  Final  MRSA PCR Screening     Status: None   Collection Time: 08/28/17 11:12 AM  Result Value Ref Range Status   MRSA by PCR NEGATIVE NEGATIVE Final    Comment:        The GeneXpert MRSA Assay (FDA approved for NASAL specimens only), is one component of a comprehensive MRSA colonization surveillance program.  It is not intended to diagnose MRSA infection nor to guide or monitor treatment for MRSA infections.   Culture, respiratory (NON-Expectorated)     Status: Abnormal   Collection Time: 08/29/17 10:46 PM  Result Value Ref Range Status   Specimen Description SPUTUM  Final   Special Requests NONE  Final   Gram Stain   Final    RARE WBC PRESENT,BOTH PMN AND MONONUCLEAR NO SQUAMOUS EPITHELIAL CELLS SEEN NO ORGANISMS SEEN Performed at [facility], [address] [place], [place] [address]    Culture MULTIPLE ORGANISMS PRESENT, NONE PREDOMINANT (A)  Final   Report Status 09/01/2017 FINAL  Final   Anti-infectives    Start     Dose/Rate Route Frequency Ordered Stop   09/02/17 0900  ceFEPIme (MAXIPIME) 1 g in dextrose 5 % 50 mL IVPB   Status:  Discontinued     1 g 100 mL/hr over 30 Minutes Intravenous Every 24 hours 09/01/17 1514 09/02/17 1118   09/01/17 0645  vancomycin (VANCOCIN) IVPB 1000 mg/200 mL premix     1,000 mg 200 mL/hr over 60 Minutes Intravenous  Once 09/01/17 0631 09/01/17 1234   08/30/17 2200  ceFEPIme (MAXIPIME) 1 g in dextrose 5 % 50 mL IVPB  Status:  Discontinued     1 g 100 mL/hr over 30 Minutes Intravenous Every 12 hours 08/30/17 1848 09/01/17 1514   08/29/17 1000  vancomycin (VANCOCIN) IVPB 1000 mg/200 mL premix  Status:  Discontinued     1,000 mg 200 mL/hr over 60 Minutes Intravenous Every 8 hours 08/29/17 0923 08/30/17 1848   08/28/17 1230  azithromycin (ZITHROMAX) 500 mg in dextrose 5 % 250 mL IVPB  Status:  Discontinued     500 mg 250 mL/hr over 60 Minutes Intravenous Every 24 hours 08/28/17 1134 09/02/17 1118   08/27/17 0400  vancomycin (VANCOCIN) IVPB 1000 mg/200 mL premix  Status:  Discontinued     1,000 mg 200 mL/hr over 60 Minutes Intravenous Every 8 hours 08/26/17 2226 08/28/17 1416   08/27/17 0400  ceFEPIme (MAXIPIME) 1 g in dextrose 5 % 50 mL IVPB  Status:  Discontinued     1 g 100 mL/hr over 30 Minutes Intravenous Every 8 hours 08/26/17 2226 08/30/17 1848   08/26/17 1915  ceFEPIme (MAXIPIME) 2 g in dextrose 5 % 50 mL IVPB     2 g 100 mL/hr over 30 Minutes Intravenous  Once 08/26/17 1911 08/26/17 2010   08/26/17 1915  vancomycin (VANCOCIN) IVPB 1000 mg/200 mL premix     1,000 mg 200 mL/hr over 60 Minutes Intravenous  Once 08/26/17 1911 08/26/17 2119      CXR: Slight improvement in patchy bilateral infiltrates with basilar predominance  ASSESSMENT / PLAN: Ventilator dependent respiratory failure - resolved Bilateral pulmonary infiltrates - suspect components of both edema versus ARDS  Improving Suspected pneumonia, NOS - now fully treated AKI - nonoliguric. Creatinine improving Hypernatremia Mild Hyperkalemia  ICU acquired anemia without overt blood loss Acute encephalopathy,  hypoactive delirium  Supplemental nasal cannula oxygen as needed to maintain SPO2 >92% Monitor temp, WBC count Micro and abx as above - antibiotics discontinued 09/17 Hold further diuresis Monitor BMET intermittently Monitor I/Os Correct electrolytes as indicated  D5W initiated 09/19 DVT px: Enoxaparin Monitor CBC intermittently MRI pending 09/20   Marda Stalker, [place] Pager [phone] (please enter 7 digits) PCCM Consult Pager [phone] (please enter 7 digits)  PCCM ATTENDING ATTESTATION:  I have evaluated patient with the APP [place], reviewed  database in its entirety and discussed care plan in detail. In addition, this patient was discussed on multidisciplinary rounds.   Important exam findings: More responsive and interactive but still very poorly oriented Diffusely weak with some asymmetry in upper extremity strength No respiratory distress No wheezes Regular, no M NABS, soft No lower extremity edema  Major problems addressed by PCCM team: Resolving hypoxemic respiratory failure Resolving pulmonary infiltrates - edema and/or ARDS Hypernatremia Hypokalemia Acute encephalopathy  PLAN/REC: Care plan is as outlined above. MRI today to evaluate encephalopathy. If any significant findings, will request neurology consultation. Will leave in ICU/SDU for today. Recheck chest x-ray in the morning. Progress diet and activity as able.    Merton Border, MD PCCM service Mobile [phone] Pager [phone] 09/05/2017 3:29 PM

## 2017-09-06 ENCOUNTER — Inpatient Hospital Stay: Payer: Medicare HMO

## 2017-09-06 DIAGNOSIS — G9341 Metabolic encephalopathy: Secondary | ICD-10-CM

## 2017-09-06 LAB — BASIC METABOLIC PANEL
ANION GAP: 11 (ref 5–15)
Anion gap: 11 (ref 5–15)
BUN: 68 mg/dL — AB (ref 6–20)
BUN: 59 mg/dL — AB (ref 6–20)
CALCIUM: 8.7 mg/dL — AB (ref 8.9–10.3)
CHLORIDE: 103 mmol/L (ref 101–111)
CO2: 38 mmol/L — AB (ref 22–32)
CO2: 36 mmol/L — AB (ref 22–32)
Calcium: 8.8 mg/dL — ABNORMAL LOW (ref 8.9–10.3)
Chloride: 100 mmol/L — ABNORMAL LOW (ref 101–111)
Creatinine, Ser: 1.69 mg/dL — ABNORMAL HIGH (ref 0.44–1.00)
Creatinine, Ser: 1.7 mg/dL — ABNORMAL HIGH (ref 0.44–1.00)
GFR calc Af Amer: 36 mL/min — ABNORMAL LOW (ref >60)
GFR calc Af Amer: 36 mL/min — ABNORMAL LOW (ref >60)
GFR calc non Af Amer: 31 mL/min — ABNORMAL LOW (ref >60)
GFR, EST NON AFRICAN AMERICAN: 31 mL/min — AB (ref >60)
GLUCOSE: 116 mg/dL — AB (ref 65–99)
Glucose, Bld: 117 mg/dL — ABNORMAL HIGH (ref 65–99)
POTASSIUM: 3.4 mmol/L — AB (ref 3.5–5.1)
Potassium: 3.3 mmol/L — ABNORMAL LOW (ref 3.5–5.1)
SODIUM: 152 mmol/L — AB (ref 135–145)
Sodium: 147 mmol/L — ABNORMAL HIGH (ref 135–145)

## 2017-09-06 LAB — CBC
HEMATOCRIT: 30 % — AB (ref 35.0–47.0)
Hemoglobin: 9.8 g/dL — ABNORMAL LOW (ref 12.0–16.0)
MCH: 26.3 pg (ref 26.0–34.0)
MCHC: 32.5 g/dL (ref 32.0–36.0)
MCV: 80.9 fL (ref 80.0–100.0)
PLATELETS: 512 10*3/uL — AB (ref 150–440)
RBC: 3.71 MIL/uL — ABNORMAL LOW (ref 3.80–5.20)
RDW: 14.7 % — AB (ref 11.5–14.5)
WBC: 11.5 10*3/uL — ABNORMAL HIGH (ref 3.6–11.0)

## 2017-09-06 LAB — PHOSPHORUS: Phosphorus: 3.8 mg/dL (ref 2.5–4.6)

## 2017-09-06 LAB — MAGNESIUM: Magnesium: 2.2 mg/dL (ref 1.7–2.4)

## 2017-09-06 LAB — GLUCOSE, CAPILLARY
GLUCOSE-CAPILLARY: 105 mg/dL — AB (ref 65–99)
GLUCOSE-CAPILLARY: 109 mg/dL — AB (ref 65–99)
Glucose-Capillary: 110 mg/dL — ABNORMAL HIGH (ref 65–99)
Glucose-Capillary: 119 mg/dL — ABNORMAL HIGH (ref 65–99)

## 2017-09-06 MED ORDER — POTASSIUM CHLORIDE 2 MEQ/ML IV SOLN
INTRAVENOUS | Status: DC
  Administered 2017-09-06 – 2017-09-08 (×2): via INTRAVENOUS
  Filled 2017-09-06 (×5): qty 1000

## 2017-09-06 MED ORDER — POTASSIUM CL IN DEXTROSE 5% 20 MEQ/L IV SOLN
20.0000 meq | INTRAVENOUS | Status: DC
  Filled 2017-09-06 (×3): qty 1000

## 2017-09-06 MED ORDER — POTASSIUM CHLORIDE 10 MEQ/50ML IV SOLN
10.0000 meq | INTRAVENOUS | Status: AC
  Administered 2017-09-06 (×3): 10 meq via INTRAVENOUS
  Filled 2017-09-06 (×3): qty 50

## 2017-09-06 MED ORDER — HYPROMELLOSE (GONIOSCOPIC) 2.5 % OP SOLN: 1.0000 [drp] | OPHTHALMIC | Status: DC | PRN

## 2017-09-06 MED ORDER — POTASSIUM CHLORIDE 10 MEQ/50ML IV SOLN
10.0000 meq | INTRAVENOUS | Status: AC
  Administered 2017-09-06 – 2017-09-07 (×4): 10 meq via INTRAVENOUS
  Filled 2017-09-06 (×4): qty 50

## 2017-09-06 MED ORDER — POLYVINYL ALCOHOL 1.4 % OP SOLN
1.0000 [drp] | OPHTHALMIC | Status: DC | PRN
  Administered 2017-09-06: 1 [drp] via OPHTHALMIC
  Filled 2017-09-06 (×2): qty 15

## 2017-09-06 MED ORDER — FUROSEMIDE 10 MG/ML IJ SOLN
20.0000 mg | Freq: Two times a day (BID) | INTRAMUSCULAR | Status: AC
  Administered 2017-09-06 (×2): 20 mg via INTRAVENOUS
  Filled 2017-09-06 (×2): qty 2

## 2017-09-06 NOTE — Progress Notes (Signed)
[facility] NAME: Morgan [name]    MR#:  [id number]  DATE OF BIRTH:  Apr 24, 1951  SUBJECTIVE:extubated 2 days ago but still has poor mental status. Neurology saw the patient. Head CAT scan, MRI of the brain did not show acute stroke. Patient is more alert and talking to me, better than yesterday. Speech therapy consult pending.   CHIEF COMPLAINT:  Was on NRBM , so transferred to ICU, remained on cont bipap, still have worsening in her Xray chest, so decided to intubate 08/29/17.  REVIEW OF SYSTEMS:  Alert, awake, oriented. No chest pain or palpitations. No shortness of breath. No abdominal pain or nausea. No headache or blurred vision.   DRUG ALLERGIES:   Allergies  Allergen Reactions  . Demerol [Meperidine]     Hives   . Strawberry (Diagnostic) Hives    VITALS:  Blood pressure (!) 141/84, pulse 90, temperature 97.9 F (36.6 C), resp. rate (!) 24, height 6\' 1"  (1.854 m), weight 93.2 kg (205 lb 7.5 oz), SpO2 94 %.  PHYSICAL EXAMINATION:  GENERAL:  66 y.o.-year-old patient lying in the bed with no acute distress. patient is watching TV. Denies any complaints  EYES: Pupils equal, round, reactive to light . No scleral icterus. Marland Kitchen  HEENT: Head atraumatic, normocephalic. Oropharynx and nasopharynx clear.  NECK:  Supple, no jugular venous distention. No thyroid enlargement, no tenderness.  LUNGS: clear  to auscultation, no wheezing. CARDIOVASCULAR: S1, S2 normal. No murmurs, rubs, or gallops.  ABDOMEN: Soft, nontender, nondistended. Bowel sounds present. No organomegaly or mass.  EXTREMITIES: No pedal edema, cyanosis, or clubbing.  NEUROLOGIC: off sedation /bilateral weakness... PSYCHIATRIC: The patient is sedated.  SKIN: No obvious rash, lesion, or ulcer.      LABORATORY PANEL:   CBC  Recent Labs Lab 09/05/17 1006  WBC 15.6*  HGB 8.6*  HCT 26.4*  PLT 450*    ------------------------------------------------------------------------------------------------------------------  Chemistries   Recent Labs Lab 09/01/17 0410  09/03/17 0441  09/06/17 0407  NA 137  < > 143  < > 152*  K 4.1  < > 4.9  < > 3.4*  CL 96*  < > 100*  < > 103  CO2 33*  < > 31  < > 38*  GLUCOSE 119*  < > 119*  < > 117*  BUN 58*  < > 78*  < > 68*  CREATININE 2.08*  < > 2.32*  < > 1.69*  CALCIUM 7.9*  < > 8.8*  < > 8.8*  MG 2.4  --   --   --   --   AST  --   --  58*  --   --   ALT  --   --  77*  --   --   ALKPHOS  --   --  175*  --   --   BILITOT  --   --  0.7  --   --   < > = values in this interval not displayed. ------------------------------------------------------------------------------------------------------------------  Cardiac Enzymes No results for input(s): TROPONINI in the last 168 hours. ------------------------------------------------------------------------------------------------------------------  RADIOLOGY:  Ct Head Wo Contrast  Result Date: 09/04/2017 CLINICAL DATA:  Altered mental status. EXAM: CT HEAD WITHOUT CONTRAST TECHNIQUE: Contiguous axial images were obtained from the base of the skull through the vertex without intravenous contrast. COMPARISON:  None. FINDINGS: Brain: No evidence of acute infarction, hemorrhage, hydrocephalus, extra-axial collection or mass lesion/mass effect. Vascular: No hyperdense vessel or unexpected calcification.  Skull: Normal. Negative for fracture or focal lesion. Sinuses/Orbits: No acute finding. Other: None. IMPRESSION: Normal head CT. Electronically Signed   By: Marijo Conception, M.D.   On: 09/04/2017 16:34   Mr Brain Wo Contrast  Result Date: 09/05/2017 CLINICAL DATA:  Initial evaluation for acute altered mental status, unresponsiveness. EXAM: MRI HEAD WITHOUT CONTRAST TECHNIQUE: Multiplanar, multiecho pulse sequences of the brain and surrounding structures were obtained without intravenous contrast. COMPARISON:   Prior CT from 09/04/2017. FINDINGS: Brain: Study degraded by motion. Cerebral volume within normal limits for age. No significant cerebral white matter changes identified. No abnormal foci of restricted diffusion to suggest acute or subacute ischemia. Gray-white matter differentiation maintained. Tiny remote lacunar infarct noted within the left thalamus (series 10, image 26). No other evidence for chronic infarction. No susceptibility artifact to suggest acute or chronic intracranial hemorrhage. No mass lesion, midline shift or mass effect. No hydrocephalus. No extra-axial fluid collection. Major dural sinuses are patent. Pituitary gland suprasellar region normal. Midline structures intact and normal. Vascular: Major intracranial vascular flow voids well maintained. Skull and upper cervical spine: Craniocervical junction normal. Mild degenerate spondylolysis noted within the upper cervical spine without stenosis. Bone marrow signal intensity normal. No scalp soft tissue abnormality. Sinuses/Orbits: Globes and orbital soft tissues within normal limits. Paranasal sinuses clear. Small bilateral mastoid effusions, of doubtful significance. Inner ear structures normal. Other: None. IMPRESSION: 1. No acute intracranial abnormality. 2. Tiny remote left thalamic lacunar infarct. 3. Otherwise normal brain MRI. Electronically Signed   By: Jeannine Boga M.D.   On: 09/05/2017 16:47   Dg Chest Port 1 View  Result Date: 09/06/2017 CLINICAL DATA:  Respiratory failure. EXAM: PORTABLE CHEST 1 VIEW COMPARISON:  09/04/2017. FINDINGS: Interim extubation and removal of NG tube. Right PICC line noted with tip over right atrium. Cardiomegaly. Diffuse progressive bilateral from interstitial prominence noted most consistent interstitial edema. Bilateral pneumonia cannot be excluded. Small bilateral pleural effusions. Biapical pleural thickening consistent scarring again noted. No pneumothorax. IMPRESSION: 1. Interim extubation  and removal of NG tube. Right PICC line in stable position. 2. Cardiomegaly with diffuse bilateral pulmonary infiltrates, progressed from prior exam. Small bilateral pleural effusions. Findings most consistent with congestive heart failure. Bilateral pneumonia cannot be excluded. Electronically Signed   By: Marcello Moores  Register   On: 09/06/2017 06:18    EKG:   Orders placed or performed during the hospital encounter of 08/26/17  . EKG 12-Lead  . EKG 12-Lead  . EKG 12-Lead  . EKG 12-Lead    ASSESSMENT AND PLAN:   * Sepsis ([place]) -Secondary to healthcare associated pneumonia, multilobar pneumonia 6. Chest x-ray  yesterdayshowed improving pneumonia. Lactic acid normal at 1.9 bloodCultures negative.     Acute respiratory failure due to multifocal pneumonia. Status post intubation,/Extubation .  bronchodilators, Pulmicort nebulizers. Chest x-ray showed pulmonary edema. Patient has acute on chronic systolic heart failure: On IV Lasix. EF  40-45% by echocardiogram in September 13. she is also on IV fluids for hypernatremia.   hypokalemia Improved. Now has hypernatremia, sodium is up from 150-152;Continue D5 with 20 MG Q KCl at 75 cc an hour. Check Chem-7 every 6 hours.    #  Essential hypertension - continue home med amlodipine     acute kidney injury secondary to sepsis. Continue IV fluids,kidney function is improving, creatinine down to 1.69.  Nutrition: npo now.76 for speech therapy evaluation  Metabolic encephalopathy due to pneumonia, hypernatremia: Neurology is following. Moderate the brain is due for acute stroke.speech herapy consult,  physical therapy consult.    All the records are reviewed and case discussed with Care Management/Social Workerr. Management plans discussed with the patient, family and they are in agreement.  CODE STATUS: fc   TOTAL TIME TAKING CARE OF THIS PATIENT: 35  minutes.   Note: This dictation was prepared with Dragon dictation along with smaller phrase  technology. Any transcriptional errors that result from this process are unintentional.   Semaj Kham M.D on 09/06/2017 at 1:11 PM  Between 7am to 6pm - Pager - [phone] After 6pm go to [email or web address] - password [email or web address] [facility]  Office  [phone]  CC: Primary care physician; Javier Docker, MD

## 2017-09-06 NOTE — Consult Note (Signed)
Reason for Consult:AMS Referring Physician: Alva Garnet  CC: AMS  HPI: Morgan [name] is an 66 y.o. female admitted with multifocal PNA and sepsis.  Patient required intubation on 9/13 due to progressive respiratory failure.  Extubated 9/19 but did not regain premorbid mental status.  Consult called for further recommendations.    Past Medical History:  Diagnosis Date  . Allergy   . Anemia   . Anxiety   . Arthritis   . Depression   . Hypertension     Past Surgical History:  Procedure Laterality Date  . ABDOMINAL HYSTERECTOMY    . APPENDECTOMY    . BREAST SURGERY    . KNEE SURGERY  2005   2 times left  . TUBAL LIGATION      Family History  Problem Relation Age of Onset  . Cancer Mother        breast and ovarian  . Breast cancer Mother   . Hypertension Father   . Hyperlipidemia Father   . Cancer Maternal Grandmother        breast  . Cancer Paternal Grandmother   . Cancer Paternal Grandfather   . Cancer Maternal Aunt        breast  . Breast cancer Maternal Aunt   . Cancer Maternal Aunt        breast  . Breast cancer Maternal Aunt     Social History:  reports that she has quit smoking. Her smoking use included Cigarettes. She has never used smokeless tobacco. She reports that she does not drink alcohol or use drugs.  Allergies  Allergen Reactions  . Demerol [Meperidine]     Hives   . Strawberry (Diagnostic) Hives    Medications:  I have reviewed the patient's current medications. Prior to Admission:  Prescriptions Prior to Admission  Medication Sig Dispense Refill Last Dose  . acetaminophen (TYLENOL) 500 MG tablet Take 1,000 mg by mouth every 6 (six) hours as needed for moderate pain (pain).    PRN at PRN  . albuterol (PROVENTIL HFA;VENTOLIN HFA) 108 (90 BASE) MCG/ACT inhaler Inhale 2 puffs into the lungs every 4 (four) hours as needed for wheezing or shortness of breath. 1 Inhaler 0 PRN at PRN  . amLODipine (NORVASC) 2.5 MG tablet Take 2.5 mg by mouth daily.    08/26/2017 at 0800  . ARIPiprazole (ABILIFY) 15 MG tablet Take 15 mg by mouth daily.    08/25/2017 at 2000  . azithromycin (ZITHROMAX) 250 MG tablet Take 1 tablet (250 mg total) by mouth daily. 1 tablet daily starting 05/16/2016 4 tablet 0 08/26/2017 at 0800  . benztropine (COGENTIN) 0.5 MG tablet Take 0.5 mg by mouth 2 (two) times daily.   08/26/2017 at 0800  . FLUoxetine (PROZAC) 20 MG capsule Take 20 mg by mouth daily.   08/25/2017 at 0800  . gabapentin (NEURONTIN) 400 MG capsule Take 400 mg by mouth 3 (three) times daily as needed.    08/26/2017 at 0800  . hydrOXYzine (VISTARIL) 25 MG capsule Take 25-50 mg by mouth 3 (three) times daily as needed. Take 25 mg by mouth twice daily and take 50 mg by mouth at bedtime as needed   PRN at PRN  . mirtazapine (REMERON) 45 MG tablet Take 45 mg by mouth at bedtime.    08/25/2017 at 2000  . pantoprazole (PROTONIX) 40 MG tablet Take 40 mg by mouth daily.   08/25/2017 at 0800  . potassium chloride (K-DUR) 10 MEQ tablet Take 10 mEq by mouth daily.  08/26/2017 at 0800   Scheduled: . enoxaparin (LOVENOX) injection  40 mg Subcutaneous Q24H  . furosemide  20 mg Intravenous Q12H  . mouth rinse  15 mL Mouth Rinse BID  . sodium chloride flush  10-40 mL Intracatheter Q12H    ROS: History obtained from the patient  General ROS: negative for - chills, fatigue, fever, night sweats, weight gain or weight loss Psychological ROS: negative for - behavioral disorder, hallucinations, memory difficulties, mood swings or suicidal ideation Ophthalmic ROS: negative for - blurry vision, double vision, eye pain or loss of vision ENT ROS: negative for - epistaxis, nasal discharge, oral lesions, sore throat, tinnitus or vertigo Allergy and Immunology ROS: negative for - hives or itchy/watery eyes Hematological and Lymphatic ROS: negative for - bleeding problems, bruising or swollen lymph nodes Endocrine ROS: negative for - galactorrhea, hair pattern changes, polydipsia/polyuria or  temperature intolerance Respiratory ROS: cough Cardiovascular ROS: negative for - chest pain, dyspnea on exertion, edema or irregular heartbeat Gastrointestinal ROS: negative for - abdominal pain, diarrhea, hematemesis, nausea/vomiting or stool incontinence Genito-Urinary ROS: negative for - dysuria, hematuria, incontinence or urinary frequency/urgency Musculoskeletal ROS: negative for - joint swelling or muscular weakness Neurological ROS: as noted in HPI Dermatological ROS: negative for rash and skin lesion changes  Physical Examination: Blood pressure (!) 141/84, pulse 90, temperature 97.9 F (36.6 C), resp. rate (!) 24, height 6\' 1"  (1.854 m), weight 93.2 kg (205 lb 7.5 oz), SpO2 94 %.  HEENT-  Normocephalic, no lesions, without obvious abnormality.  Normal external eye and conjunctiva.  Normal TM's bilaterally.  Normal auditory canals and external ears. Normal external nose, mucus membranes and septum.  Normal pharynx. Cardiovascular- S1, S2 normal, pulses palpable throughout   Lungs- chest clear, no wheezing, rales, normal symmetric air entry Abdomen- soft, non-tender; bowel sounds normal; no masses,  no organomegaly Extremities- no edema Lymph-no adenopathy palpable Musculoskeletal-no joint tenderness, deformity or swelling Skin-warm and dry, no hyperpigmentation, vitiligo, or suspicious lesions  Neurological Examination   Mental Status: Flat affect.  Alert, oriented to name and that in the hospital.  Knows age but unable to tell me the month or the year.  Speech fluent without evidence of aphasia.  Able to follow 3 step commands without difficulty. Cranial Nerves: II: Discs flat bilaterally; Visual fields grossly normal, pupils equal, round, reactive to light and accommodation III,IV, VI: ptosis not present, extra-ocular motions intact bilaterally V,VII: smile symmetric, facial light touch sensation normal bilaterally VIII: hearing normal bilaterally IX,X: gag reflex  present XI: bilateral shoulder shrug XII: midline tongue extension Motor: Moves all extremities weakly against gravity.  No focal weakness noted.  Tremor noted in the LUE.   Sensory: Pinprick and light touch intact throughout, bilaterally Deep Tendon Reflexes: 2+ and symmetric with absent AJ's bilaterally Plantars: Right: downgoing   Left: downgoing Cerebellar: Normal finger-to-nose and normal heel-to-shin testing bilaterally Gait: not tested due to safety concerns    Laboratory Studies:   Basic Metabolic Panel:  Recent Labs Lab 08/30/17 1738  08/31/17 0500 08/31/17 1927 09/01/17 0410  09/03/17 0441 09/04/17 0538 09/05/17 1006 09/05/17 1108 09/06/17 0407  NA  --   < > 135  --  137  < > 143 149* 140 150* 152*  K  --   < > 3.8  --  4.1  < > 4.9 5.3* 3.5 3.7 3.4*  CL  --   < > 93*  --  96*  < > 100* 101 93* 99* 103  CO2  --   < >  33*  --  33*  < > 31 36* 36* 39* 38*  GLUCOSE  --   < > 136*  --  119*  < > 119* 115* 412* 131* 117*  BUN  --   < > 45*  --  58*  < > 78* 78* 74* 76* 68*  CREATININE  --   < > 1.68*  --  2.08*  < > 2.32* 2.23* 1.90* 2.01* 1.69*  CALCIUM  --   < > 8.0*  --  7.9*  < > 8.8* 9.0 8.4* 8.9 8.8*  MG 2.2  --  2.2 2.3 2.4  --   --   --   --   --   --   PHOS 3.7  --  4.5 4.0 4.6  --   --   --   --   --   --   < > = values in this interval not displayed.  Liver Function Tests:  Recent Labs Lab 09/03/17 0441  AST 58*  ALT 77*  ALKPHOS 175*  BILITOT 0.7  PROT 6.5  ALBUMIN 2.2*   No results for input(s): LIPASE, AMYLASE in the last 168 hours. No results for input(s): AMMONIA in the last 168 hours.  CBC:  Recent Labs Lab 08/31/17 0500 09/01/17 0410 09/02/17 0523 09/03/17 0441 09/04/17 0538 09/05/17 1006  WBC 10.0 10.9 11.0 14.6* 13.8* 15.6*  NEUTROABS 8.4*  --   --   --   --   --   HGB 7.9* 7.6* 7.3* 8.0* 8.2* 8.6*  HCT 23.8* 22.4* 22.2* 24.2* 24.8* 26.4*  MCV 80.9 79.0* 80.3 79.1* 80.7 80.4  PLT 246 267 260 331 402 450*    Cardiac  Enzymes: No results for input(s): CKTOTAL, CKMB, CKMBINDEX, TROPONINI in the last 168 hours.  BNP: Invalid input(s): POCBNP  CBG:  Recent Labs Lab 09/05/17 0721 09/05/17 1141 09/05/17 1842 09/06/17 0011 09/06/17 0532  GLUCAP 139* 131* 114* 109* 110*    Microbiology: Results for orders placed or performed during the hospital encounter of 08/26/17  C difficile quick scan w PCR reflex     Status: None   Collection Time: 08/26/17  8:37 PM  Result Value Ref Range Status   C Diff antigen NEGATIVE NEGATIVE Final   C Diff toxin NEGATIVE NEGATIVE Final   C Diff interpretation No C. difficile detected.  Final  Culture, blood (Routine X 2) w Reflex to ID Panel     Status: None   Collection Time: 08/26/17  9:48 PM  Result Value Ref Range Status   Specimen Description BLOOD LEFT HAND  Final   Special Requests   Final    BOTTLES DRAWN AEROBIC AND ANAEROBIC Blood Culture adequate volume   Culture NO GROWTH 5 DAYS  Final   Report Status 08/31/2017 FINAL  Final  Culture, blood (Routine X 2) w Reflex to ID Panel     Status: None   Collection Time: 08/26/17  9:48 PM  Result Value Ref Range Status   Specimen Description BLOOD RAC  Final   Special Requests   Final    BOTTLES DRAWN AEROBIC AND ANAEROBIC Blood Culture results may not be optimal due to an excessive volume of blood received in culture bottles   Culture NO GROWTH 5 DAYS  Final   Report Status 08/31/2017 FINAL  Final  MRSA PCR Screening     Status: None   Collection Time: 08/28/17 11:12 AM  Result Value Ref Range Status   MRSA by PCR NEGATIVE  NEGATIVE Final    Comment:        The GeneXpert MRSA Assay (FDA approved for NASAL specimens only), is one component of a comprehensive MRSA colonization surveillance program. It is not intended to diagnose MRSA infection nor to guide or monitor treatment for MRSA infections.   Culture, respiratory (NON-Expectorated)     Status: Abnormal   Collection Time: 08/29/17 10:46 PM   Result Value Ref Range Status   Specimen Description SPUTUM  Final   Special Requests NONE  Final   Gram Stain   Final    RARE WBC PRESENT,BOTH PMN AND MONONUCLEAR NO SQUAMOUS EPITHELIAL CELLS SEEN NO ORGANISMS SEEN Performed at [facility], [address], [place], [place] [address]    Culture MULTIPLE ORGANISMS PRESENT, NONE PREDOMINANT (A)  Final   Report Status 09/01/2017 FINAL  Final    Coagulation Studies: No results for input(s): LABPROT, INR in the last 72 hours.  Urinalysis: No results for input(s): COLORURINE, LABSPEC, PHURINE, GLUCOSEU, HGBUR, BILIRUBINUR, KETONESUR, PROTEINUR, UROBILINOGEN, NITRITE, LEUKOCYTESUR in the last 168 hours.  Invalid input(s): APPERANCEUR  Lipid Panel:     Component Value Date/Time   TRIG 106 09/01/2017 1336    HgbA1C: No results found for: HGBA1C  Urine Drug Screen:  No results found for: LABOPIA, COCAINSCRNUR, LABBENZ, AMPHETMU, THCU, LABBARB  Alcohol Level: No results for input(s): ETH in the last 168 hours.   Imaging: Ct Head Wo Contrast  Result Date: 09/04/2017 CLINICAL DATA:  Altered mental status. EXAM: CT HEAD WITHOUT CONTRAST TECHNIQUE: Contiguous axial images were obtained from the base of the skull through the vertex without intravenous contrast. COMPARISON:  None. FINDINGS: Brain: No evidence of acute infarction, hemorrhage, hydrocephalus, extra-axial collection or mass lesion/mass effect. Vascular: No hyperdense vessel or unexpected calcification. Skull: Normal. Negative for fracture or focal lesion. Sinuses/Orbits: No acute finding. Other: None. IMPRESSION: Normal head CT. Electronically Signed   By: Marijo Conception, M.D.   On: 09/04/2017 16:34   Mr Brain Wo Contrast  Result Date: 09/05/2017 CLINICAL DATA:  Initial evaluation for acute altered mental status, unresponsiveness. EXAM: MRI HEAD WITHOUT CONTRAST TECHNIQUE: Multiplanar, multiecho pulse sequences of the brain and surrounding structures were obtained without  intravenous contrast. COMPARISON:  Prior CT from 09/04/2017. FINDINGS: Brain: Study degraded by motion. Cerebral volume within normal limits for age. No significant cerebral white matter changes identified. No abnormal foci of restricted diffusion to suggest acute or subacute ischemia. Gray-white matter differentiation maintained. Tiny remote lacunar infarct noted within the left thalamus (series 10, image 26). No other evidence for chronic infarction. No susceptibility artifact to suggest acute or chronic intracranial hemorrhage. No mass lesion, midline shift or mass effect. No hydrocephalus. No extra-axial fluid collection. Major dural sinuses are patent. Pituitary gland suprasellar region normal. Midline structures intact and normal. Vascular: Major intracranial vascular flow voids well maintained. Skull and upper cervical spine: Craniocervical junction normal. Mild degenerate spondylolysis noted within the upper cervical spine without stenosis. Bone marrow signal intensity normal. No scalp soft tissue abnormality. Sinuses/Orbits: Globes and orbital soft tissues within normal limits. Paranasal sinuses clear. Small bilateral mastoid effusions, of doubtful significance. Inner ear structures normal. Other: None. IMPRESSION: 1. No acute intracranial abnormality. 2. Tiny remote left thalamic lacunar infarct. 3. Otherwise normal brain MRI. Electronically Signed   By: Jeannine Boga M.D.   On: 09/05/2017 16:47   Dg Chest Port 1 View  Result Date: 09/06/2017 CLINICAL DATA:  Respiratory failure. EXAM: PORTABLE CHEST 1 VIEW COMPARISON:  09/04/2017.  FINDINGS: Interim extubation and removal of NG tube. Right PICC line noted with tip over right atrium. Cardiomegaly. Diffuse progressive bilateral from interstitial prominence noted most consistent interstitial edema. Bilateral pneumonia cannot be excluded. Small bilateral pleural effusions. Biapical pleural thickening consistent scarring again noted. No pneumothorax.  IMPRESSION: 1. Interim extubation and removal of NG tube. Right PICC line in stable position. 2. Cardiomegaly with diffuse bilateral pulmonary infiltrates, progressed from prior exam. Small bilateral pleural effusions. Findings most consistent with congestive heart failure. Bilateral pneumonia cannot be excluded. Electronically Signed   By: Marcello Moores  Register   On: 09/06/2017 06:18     Assessment/Plan: 66 year old female admitted with multifocal PNA requiring intubation, now extubated but not at baseline mental status.  MRI performed and reviewed.  MRI of the brain shows no acute changes.  Suspect mental status metabolic in origin.  Patient with infection (white blood cell count increasing), hypernatremia, renal insufficiency and hepatic dysfunction.    Recommendations: 1.  Agree with continuing to address medical concerns with the knowledge that mental status may lag behind normalization of lab studies.   2.  Will continue to follow prn.    Alexis Goodell, MD Neurology [phone] 09/06/2017, 11:18 AM

## 2017-09-06 NOTE — Progress Notes (Signed)
Nutrition Follow-up  DOCUMENTATION CODES:   Not applicable  INTERVENTION:  Will recommend appropriate oral nutrition supplement to help patient meet calorie/protein needs pending SLP assessment.  If diet unable to be advanced, would recommend placement of small-bore feeding tube to initiate enteral nutrition by 9/26.  NUTRITION DIAGNOSIS:   Inadequate oral intake related to inability to eat, lethargy/confusion as evidenced by NPO status.  Ongoing.  GOAL:   Patient will meet greater than or equal to 90% of their needs  Not met.  MONITOR:   Diet advancement, Labs, Weight trends, I & O's  REASON FOR ASSESSMENT:   Consult Enteral/tube feeding initiation and management  ASSESSMENT:   66 yo female with PMH of Former Smoker, Neuromuscular Disorder, HTN, Depression, Arthritis, Anxiety, Anemia, and Allergies.  She presented to [facility] ER 09/10 with c/o fever, cough, malaise, and myalgias onset of symptoms 3 days prior to presentation to ER.  She also states she's had diarrhea for the last 3 months on average 6 stools daily.  In the ER CXR revealed multifocal pneumonia and she met sepsis criteria, therefore iv abx were initiated.  She was subsequently admitted to the [facility] unit by hospitalist team for further workup and treatment.  On 09/12 she developed worsening hypoxia, therefore she was placed on a non rebreather mask with O2 sats remaining in the 80's.  She was subsequently placed on continuous Bipap and transferred to ICU 09/12  -Patient was extubated 9/19. OGT was removed.  -Neurology was consulted as patient's mental status has not returned to baseline following extubation. -Underwent CT head on 9/19, found to have normal head CT. Also underwent MRI brain on 9/20, which only found tiny remote left thalamic lacunar infarct, otherwise normal brain MRI. -Rectal tube removed today (9/21). -Pending SLP consult prior to diet advancement.  Met with patient and several family members  at bedside (her aunt and uncle). Patient is slow to respond, but able to respond to questions. She is hungry and ready for a diet. 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 She does not eat fish. She avoid milk, but does eat ice cream. She reports she was weight stable prior to admission.  Medications reviewed and include: Lasix 20 mg Q12hrs, D5W with KCl 20 mEq/L @ 75 ml/hr (90 grams dextrose, 306 kcal daily).  Labs reviewed: CBG 110-119 past 24 hrs, Sodium 152, Potassium 3.4, BUN 68 (trending down), Creatinine 1.69 (trending down).  Discussed with RN.  Diet Order:  Diet NPO time specified  Skin:  Wound (see comment) (MSAD to bilateral medial thighs)  Last BM:  09/04/2017 - medium type 7 per rectal tube (now removed)  Height:   Ht Readings from Last 1 Encounters:  08/26/17 _0  (1.854 m)    Weight:   Wt Readings from Last 1 Encounters:  09/06/17 205 lb 7.5 oz (93.2 kg)    Ideal Body Weight:  75 kg  BMI:  Body mass index is 27.11 kg/m.  Estimated Nutritional Needs:   Kcal:  1900-2200 (MSJ x 1.2-1.4)  Protein:  90-110 grams (1-1.2 grams/kg)  Fluid:  2.2 L/day (25 ml/kg)  EDUCATION NEEDS:   No education needs identified at this time  Willey Blade, MS, RD, LDN Pager: [phone] After Hours Pager: [phone]

## 2017-09-06 NOTE — Progress Notes (Signed)
[facility] Progess Note    SYNOPSIS     ASSESSMENT/PLAN   ASSESSMENT / PLAN: Ventilator dependent respiratory failure - resolved Bilateral pulmonary infiltrates - suspect components of both edema versus ARDS-- Improving Suspected pneumonia, NOS - now fully treated-- off abx.  AKI - nonoliguric. Creatinine improving 2.06>>1.69 Hypernatremia Mild Hyperkalemia  ICU acquired anemia without overt blood loss Acute encephalopathy, hypoactive delirium; MRI 9/20; no acute change, tiny remote lacunar infarct.   Supplemental nasal cannula oxygen as needed to maintain SPO2 >92% Monitor temp, WBC count Micro and abx as above - antibiotics discontinued 09/17 restart diuresis Monitor BMET intermittently Monitor I/Os Correct electrolytes as indicated  D5W initiated 09/19-- needs free water replacement.  DVT px: Enoxaparin Monitor CBC intermittently   CXR images personally reviewed; increasing pulm edema.  INTAKE / OUTPUT:  Intake/Output Summary (Last 24 hours) at 09/06/17 0833 Last data filed at 09/06/17 0500  Gross per 24 hour  Intake           729.17 ml  Output             1400 ml  Net          -670.83 ml    Micro/culture results:  BCx2 9/10; NTD UC -- Sputum--  Antibiotics:  MAJOR EVENTS/TEST RESULTS:   Best Practices  DVT Prophylaxis: enoxaparin GI Prophylaxis: --   ---------------------------------------   ----------------------------------------   Name: DORIA FERN MRN: [id number] DOB: 1951-09-08    ADMISSION DATE:  08/26/2017      SUBJECTIVE:   Pt awake, alert but confused, no new complaints.    Review of Systems:  Constitutional: Feels well. Cardiovascular: No chest pain.  Pulmonary: Denies dyspnea.   10 point review of systems was negative  other than what is documented in the HPI.    VITAL SIGNS: Temp:  [97.6 F (36.4 C)-98.7 F (37.1 C)] 97.9 F (36.6 C) (09/21 0700) Pulse Rate:  [54-94] 90 (09/21  0800) Resp:  [19-33] 24 (09/21 0800) BP: (108-156)/(66-98) 141/84 (09/21 0800) SpO2:  [88 %-100 %] 94 % (09/21 0800) Weight:  [205 lb 7.5 oz (93.2 kg)] 205 lb 7.5 oz (93.2 kg) (09/21 0408)     PHYSICAL EXAMINATION: Physical Examination:   VS: BP (!) 141/84   Pulse 90   Temp 97.9 F (36.6 C)   Resp (!) 24   Ht 6\' 1"  (1.854 m)   Wt 205 lb 7.5 oz (93.2 kg)   SpO2 94%   BMI 27.11 kg/m   General Appearance: No distress  Neuro:without focal findings, mental status normal, confused.  HEENT: PERRLA, EOM intact. Pulmonary: normal breath sounds   CardiovascularNormal S1,S2.  No m/r/g.   Abdomen: Benign, Soft, non-tender. Renal:  No costovertebral tenderness  GU:  Not performed at this time. Endocrine: No evident thyromegaly. Skin:   warm, no rashes, no ecchymosis  Extremities: normal, no cyanosis, clubbing.    LABORATORY PANEL:   CBC  Recent Labs Lab 09/05/17 1006  WBC 15.6*  HGB 8.6*  HCT 26.4*  PLT 450*    Chemistries   Recent Labs Lab 09/01/17 0410  09/03/17 0441  09/06/17 0407  NA 137  < > 143  < > 152*  K 4.1  < > 4.9  < > 3.4*  CL 96*  < > 100*  < > 103  CO2 33*  < > 31  < > 38*  GLUCOSE 119*  < > 119*  < > 117*  BUN 58*  < >  78*  < > 68*  CREATININE 2.08*  < > 2.32*  < > 1.69*  CALCIUM 7.9*  < > 8.8*  < > 8.8*  MG 2.4  --   --   --   --   PHOS 4.6  --   --   --   --   AST  --   --  58*  --   --   ALT  --   --  77*  --   --   ALKPHOS  --   --  175*  --   --   BILITOT  --   --  0.7  --   --   < > = values in this interval not displayed.   Recent Labs Lab 09/05/17 0428 09/05/17 0721 09/05/17 1141 09/05/17 1842 09/06/17 0011 09/06/17 0532  GLUCAP 140* 139* 131* 114* 109* 110*   No results for input(s): PHART, PCO2ART, PO2ART in the last 168 hours.  Recent Labs Lab 09/03/17 0441  AST 58*  ALT 77*  ALKPHOS 175*  BILITOT 0.7  ALBUMIN 2.2*    Cardiac Enzymes No results for input(s): TROPONINI in the last 168 hours.  RADIOLOGY:   Ct Head Wo Contrast  Result Date: 09/04/2017 CLINICAL DATA:  Altered mental status. EXAM: CT HEAD WITHOUT CONTRAST TECHNIQUE: Contiguous axial images were obtained from the base of the skull through the vertex without intravenous contrast. COMPARISON:  None. FINDINGS: Brain: No evidence of acute infarction, hemorrhage, hydrocephalus, extra-axial collection or mass lesion/mass effect. Vascular: No hyperdense vessel or unexpected calcification. Skull: Normal. Negative for fracture or focal lesion. Sinuses/Orbits: No acute finding. Other: None. IMPRESSION: Normal head CT. Electronically Signed   By: Marijo Conception, M.D.   On: 09/04/2017 16:34   Mr Brain Wo Contrast  Result Date: 09/05/2017 CLINICAL DATA:  Initial evaluation for acute altered mental status, unresponsiveness. EXAM: MRI HEAD WITHOUT CONTRAST TECHNIQUE: Multiplanar, multiecho pulse sequences of the brain and surrounding structures were obtained without intravenous contrast. COMPARISON:  Prior CT from 09/04/2017. FINDINGS: Brain: Study degraded by motion. Cerebral volume within normal limits for age. No significant cerebral white matter changes identified. No abnormal foci of restricted diffusion to suggest acute or subacute ischemia. Gray-white matter differentiation maintained. Tiny remote lacunar infarct noted within the left thalamus (series 10, image 26). No other evidence for chronic infarction. No susceptibility artifact to suggest acute or chronic intracranial hemorrhage. No mass lesion, midline shift or mass effect. No hydrocephalus. No extra-axial fluid collection. Major dural sinuses are patent. Pituitary gland suprasellar region normal. Midline structures intact and normal. Vascular: Major intracranial vascular flow voids well maintained. Skull and upper cervical spine: Craniocervical junction normal. Mild degenerate spondylolysis noted within the upper cervical spine without stenosis. Bone marrow signal intensity normal. No scalp soft  tissue abnormality. Sinuses/Orbits: Globes and orbital soft tissues within normal limits. Paranasal sinuses clear. Small bilateral mastoid effusions, of doubtful significance. Inner ear structures normal. Other: None. IMPRESSION: 1. No acute intracranial abnormality. 2. Tiny remote left thalamic lacunar infarct. 3. Otherwise normal brain MRI. Electronically Signed   By: Jeannine Boga M.D.   On: 09/05/2017 16:47   Dg Chest Port 1 View  Result Date: 09/06/2017 CLINICAL DATA:  Respiratory failure. EXAM: PORTABLE CHEST 1 VIEW COMPARISON:  09/04/2017. FINDINGS: Interim extubation and removal of NG tube. Right PICC line noted with tip over right atrium. Cardiomegaly. Diffuse progressive bilateral from interstitial prominence noted most consistent interstitial edema. Bilateral pneumonia cannot be excluded. Small bilateral pleural effusions.  Biapical pleural thickening consistent scarring again noted. No pneumothorax. IMPRESSION: 1. Interim extubation and removal of NG tube. Right PICC line in stable position. 2. Cardiomegaly with diffuse bilateral pulmonary infiltrates, progressed from prior exam. Small bilateral pleural effusions. Findings most consistent with congestive heart failure. Bilateral pneumonia cannot be excluded. Electronically Signed   By: Marcello Moores  Register   On: 09/06/2017 06:18        --Marda Stalker, MD.  ICU Pager: [phone] [facility] Office Number: [phone]   09/06/2017

## 2017-09-06 NOTE — Progress Notes (Signed)
Spoke with patients aunt. Patient states that she is having dry eyes. She states that she has contacts in and she wears them all the time. I spoke with Hinton Dyer PA. Orders for drops and to take contacts out. Patient states that she does not know where at her house her glasses are at and she wont be able to see. She did state that she would take them out at bedtime.

## 2017-09-06 NOTE — Progress Notes (Signed)
[facility] Progress Note Patient Name: Morgan [name] DOB: 11/15/1951 MRN: [id number]   Date of Service  09/06/2017  HPI/Events of Note  K+ = 3.4 and Creatinine = 1.69.  eICU Interventions  Will replace K+.     Intervention Category Major Interventions: Electrolyte abnormality - evaluation and management  Sommer,Steven Eugene 09/06/2017, 5:48 AM

## 2017-09-07 LAB — GLUCOSE, CAPILLARY
GLUCOSE-CAPILLARY: 108 mg/dL — AB (ref 65–99)
Glucose-Capillary: 108 mg/dL — ABNORMAL HIGH (ref 65–99)
Glucose-Capillary: 120 mg/dL — ABNORMAL HIGH (ref 65–99)
Glucose-Capillary: 162 mg/dL — ABNORMAL HIGH (ref 65–99)

## 2017-09-07 LAB — BASIC METABOLIC PANEL
ANION GAP: 9 (ref 5–15)
BUN: 56 mg/dL — AB (ref 6–20)
CO2: 35 mmol/L — ABNORMAL HIGH (ref 22–32)
Calcium: 8.6 mg/dL — ABNORMAL LOW (ref 8.9–10.3)
Chloride: 105 mmol/L (ref 101–111)
Creatinine, Ser: 1.7 mg/dL — ABNORMAL HIGH (ref 0.44–1.00)
GFR, EST AFRICAN AMERICAN: 36 mL/min — AB (ref >60)
GFR, EST NON AFRICAN AMERICAN: 31 mL/min — AB (ref >60)
Glucose, Bld: 118 mg/dL — ABNORMAL HIGH (ref 65–99)
POTASSIUM: 3.9 mmol/L (ref 3.5–5.1)
SODIUM: 149 mmol/L — AB (ref 135–145)

## 2017-09-07 LAB — CBC
HCT: 29.5 % — ABNORMAL LOW (ref 35.0–47.0)
HEMOGLOBIN: 9.7 g/dL — AB (ref 12.0–16.0)
MCH: 26.1 pg (ref 26.0–34.0)
MCHC: 32.9 g/dL (ref 32.0–36.0)
MCV: 79.4 fL — ABNORMAL LOW (ref 80.0–100.0)
PLATELETS: 512 10*3/uL — AB (ref 150–440)
RBC: 3.72 MIL/uL — AB (ref 3.80–5.20)
RDW: 14.8 % — ABNORMAL HIGH (ref 11.5–14.5)
WBC: 14.6 10*3/uL — AB (ref 3.6–11.0)

## 2017-09-07 MED ORDER — ARIPIPRAZOLE 15 MG PO TABS
15.0000 mg | ORAL_TABLET | Freq: Every day | ORAL | Status: DC
  Administered 2017-09-07 – 2017-09-12 (×6): 15 mg via ORAL
  Filled 2017-09-07 (×6): qty 1

## 2017-09-07 MED ORDER — FLUOXETINE HCL 20 MG PO CAPS
20.0000 mg | ORAL_CAPSULE | Freq: Every day | ORAL | Status: DC
  Administered 2017-09-08 – 2017-09-12 (×5): 20 mg via ORAL
  Filled 2017-09-07 (×5): qty 1

## 2017-09-07 MED ORDER — AMLODIPINE BESYLATE 5 MG PO TABS
2.5000 mg | ORAL_TABLET | Freq: Every day | ORAL | Status: DC
  Administered 2017-09-08 – 2017-09-12 (×5): 2.5 mg via ORAL
  Filled 2017-09-07 (×5): qty 1

## 2017-09-07 MED ORDER — BENZTROPINE MESYLATE 0.5 MG PO TABS
0.5000 mg | ORAL_TABLET | Freq: Two times a day (BID) | ORAL | Status: DC
  Administered 2017-09-07 – 2017-09-12 (×11): 0.5 mg via ORAL
  Filled 2017-09-07 (×12): qty 1

## 2017-09-07 NOTE — Progress Notes (Signed)
[facility] [place] NAME: Morgan [name]    MR#:  [id number]  DATE OF BIRTH:  1951/07/08  SUBJECTIVE:   Patient here due to acute respiratory failure secondary to pneumonia. Was intubated but now extubated and doing well. Mental status is improving. No other acute events overnight.  REVIEW OF SYSTEMS:    Review of Systems  Constitutional: Negative for chills and fever.  HENT: Negative for congestion and tinnitus.   Eyes: Negative for blurred vision and double vision.  Respiratory: Negative for cough, shortness of breath and wheezing.   Cardiovascular: Negative for chest pain, orthopnea and PND.  Gastrointestinal: Negative for abdominal pain, diarrhea, nausea and vomiting.  Genitourinary: Negative for dysuria and hematuria.  Neurological: Positive for weakness. Negative for dizziness, sensory change and focal weakness.  All other systems reviewed and are negative.   Nutrition: Dysphagia II with nectar thick liquids Tolerating Diet: Yes Tolerating PT: Await Eval.   DRUG ALLERGIES:   Allergies  Allergen Reactions  . Demerol [Meperidine]     Hives   . Strawberry (Diagnostic) Hives    VITALS:  Blood pressure 140/88, pulse 89, temperature 98.1 F (36.7 C), resp. rate (!) 22, height 6\' 1"  (1.854 m), weight 89.5 kg (197 lb 5 oz), SpO2 94 %.  PHYSICAL EXAMINATION:   Physical Exam  GENERAL:  66 y.o.-year-old patient lying in bed in no acute distress.  EYES: Pupils equal, round, reactive to light and accommodation. No scleral icterus. Extraocular muscles intact.  HEENT: Head atraumatic, normocephalic. Oropharynx and nasopharynx clear.  NECK:  Supple, no jugular venous distention. No thyroid enlargement, no tenderness.  LUNGS: Poor Resp. effort, no wheezing, rales, rhonchi. No use of accessory muscles of respiration.  CARDIOVASCULAR: S1, S2 normal. No murmurs, rubs, or gallops.  ABDOMEN: Soft, nontender, nondistended. Bowel sounds  present. No organomegaly or mass.  EXTREMITIES: No cyanosis, clubbing or edema b/l.    NEUROLOGIC: Cranial nerves II through XII are intact. No focal Motor or sensory deficits b/l.  Globally weak PSYCHIATRIC: The patient is alert and oriented x 1.  SKIN: No obvious rash, lesion, or ulcer.    LABORATORY PANEL:   CBC  Recent Labs Lab 09/07/17 0443  WBC 14.6*  HGB 9.7*  HCT 29.5*  PLT 512*   ------------------------------------------------------------------------------------------------------------------  Chemistries   Recent Labs Lab 09/03/17 0441  09/06/17 1936 09/07/17 0443  NA 143  < > 147* 149*  K 4.9  < > 3.3* 3.9  CL 100*  < > 100* 105  CO2 31  < > 36* 35*  GLUCOSE 119*  < > 116* 118*  BUN 78*  < > 59* 56*  CREATININE 2.32*  < > 1.70* 1.70*  CALCIUM 8.8*  < > 8.7* 8.6*  MG  --   --  2.2  --   AST 58*  --   --   --   ALT 77*  --   --   --   ALKPHOS 175*  --   --   --   BILITOT 0.7  --   --   --   < > = values in this interval not displayed. ------------------------------------------------------------------------------------------------------------------  Cardiac Enzymes No results for input(s): TROPONINI in the last 168 hours. ------------------------------------------------------------------------------------------------------------------  RADIOLOGY:  Mr Brain Wo Contrast  Result Date: 09/05/2017 CLINICAL DATA:  Initial evaluation for acute altered mental status, unresponsiveness. EXAM: MRI HEAD WITHOUT CONTRAST TECHNIQUE: Multiplanar, multiecho pulse sequences of the brain and surrounding structures were obtained without  intravenous contrast. COMPARISON:  Prior CT from 09/04/2017. FINDINGS: Brain: Study degraded by motion. Cerebral volume within normal limits for age. No significant cerebral white matter changes identified. No abnormal foci of restricted diffusion to suggest acute or subacute ischemia. Gray-white matter differentiation maintained. Tiny remote  lacunar infarct noted within the left thalamus (series 10, image 26). No other evidence for chronic infarction. No susceptibility artifact to suggest acute or chronic intracranial hemorrhage. No mass lesion, midline shift or mass effect. No hydrocephalus. No extra-axial fluid collection. Major dural sinuses are patent. Pituitary gland suprasellar region normal. Midline structures intact and normal. Vascular: Major intracranial vascular flow voids well maintained. Skull and upper cervical spine: Craniocervical junction normal. Mild degenerate spondylolysis noted within the upper cervical spine without stenosis. Bone marrow signal intensity normal. No scalp soft tissue abnormality. Sinuses/Orbits: Globes and orbital soft tissues within normal limits. Paranasal sinuses clear. Small bilateral mastoid effusions, of doubtful significance. Inner ear structures normal. Other: None. IMPRESSION: 1. No acute intracranial abnormality. 2. Tiny remote left thalamic lacunar infarct. 3. Otherwise normal brain MRI. Electronically Signed   By: Jeannine Boga M.D.   On: 09/05/2017 16:47   Dg Chest Port 1 View  Result Date: 09/06/2017 CLINICAL DATA:  Respiratory failure. EXAM: PORTABLE CHEST 1 VIEW COMPARISON:  09/04/2017. FINDINGS: Interim extubation and removal of NG tube. Right PICC line noted with tip over right atrium. Cardiomegaly. Diffuse progressive bilateral from interstitial prominence noted most consistent interstitial edema. Bilateral pneumonia cannot be excluded. Small bilateral pleural effusions. Biapical pleural thickening consistent scarring again noted. No pneumothorax. IMPRESSION: 1. Interim extubation and removal of NG tube. Right PICC line in stable position. 2. Cardiomegaly with diffuse bilateral pulmonary infiltrates, progressed from prior exam. Small bilateral pleural effusions. Findings most consistent with congestive heart failure. Bilateral pneumonia cannot be excluded. Electronically Signed   By:  Marcello Moores  Register   On: 09/06/2017 06:18     ASSESSMENT AND PLAN:   66 year old female with past medical history of hypertension, depression, anxiety, osteoarthritis who presented to the hospital due to shortness of breath and noted to be in acute respiratory failure secondary to pneumonia.  1. Acute surgery failure with hypoxia-secondary to multifocal pneumonia/CHF/Pulm edema. Initially patient was intubated but now extubated and doing well. - patient was on broad-spectrum IV antibiotics but now have been discontinued as pneumonia has been ruled out. Also given IV diuresis and responded to it and her hypoxia is improved and she is extubated.  2.sepsis-initially thought to be secondary to pneumonia. Now has been ruled out. Patient off antibiotics. Lactic acid has normalized.  3. Hypernatremia-continue D5W. Follow sodium.  4. Hypokalemia-improving with supplementation will continue to monitor.  5. Altered mental status-etiology unclear but suspected to be metabolic encephalopathy. CT scan, MRI of the brain negative for acute neurologic process. Mental status is improving as patient's respiratory status has improved. - We'll continue to monitor.  6. Depression-continue Abilify, Prozac.    All the records are reviewed and case discussed with Care Management/Social Worker. Management plans discussed with the patient, family and they are in agreement.  CODE STATUS: Full code  DVT Prophylaxis: Lovenox  TOTAL TIME TAKING CARE OF THIS PATIENT: 30 minutes.   POSSIBLE D/C IN 2-3 DAYS, DEPENDING ON CLINICAL CONDITION.   Henreitta Leber M.D on 09/07/2017 at 3:21 PM  Between 7am to 6pm - Pager - [phone]  After 6pm go to [email or web address] - [facility]  Office  [phone]  CC: Primary care physician; [place],  [name], MD

## 2017-09-07 NOTE — Evaluation (Signed)
Clinical/Bedside Swallow Evaluation Patient Details  Name: Morgan [name] MRN: [id number] Date of Birth: 1951-06-21  Today's Date: 09/07/2017 Time: SLP Start Time (ACUTE ONLY): 7616 SLP Stop Time (ACUTE ONLY): 0953 SLP Time Calculation (min) (ACUTE ONLY): 28 min  Past Medical History:  Past Medical History:  Diagnosis Date  . Allergy   . Anemia   . Anxiety   . Arthritis   . Depression   . Hypertension    Past Surgical History:  Past Surgical History:  Procedure Laterality Date  . ABDOMINAL HYSTERECTOMY    . APPENDECTOMY    . BREAST SURGERY    . KNEE SURGERY  2005   2 times left  . TUBAL LIGATION     HPI:      Assessment / Plan / Recommendation Clinical Impression  pt presents with a mild to moderate risk for aspiration as characterized by increased lingual and labial movements during swallow of liquids and solids. pt had poor awareness of bolus and would attempt mastication with liquids and puree trials. pt had increased respirations throughout intake and decreased O2 with thin liquids. pt had no overt ssx aspiration with nectar thick liquids and solid trials. due to increased effort for regular textures, dys 2 is recommended. NSG notified and nsg and md agree to diet change. pt educated on diet recommendations and verbally agreed. slp guarded on pt awareness  and understnding. SLP Visit Diagnosis: Dysphagia, oropharyngeal phase (R13.12)    Aspiration Risk  Mild aspiration risk;Moderate aspiration risk    Diet Recommendation Dysphagia 2 (Fine chop);Nectar-thick liquid   Liquid Administration via: Spoon;Cup Medication Administration: Crushed with puree Supervision: Patient able to self feed;Staff to assist with self feeding Compensations: Slow rate;Small sips/bites;Follow solids with liquid Postural Changes: Seated upright at 90 degrees    Other  Recommendations Oral Care Recommendations: Oral care BID   Follow up Recommendations        Frequency and Duration min  4x/week  2 weeks       Prognosis Prognosis for Safe Diet Advancement: Good      Swallow Study   General Date of Onset: 09/05/17 Type of Study: Bedside Swallow Evaluation Diet Prior to this Study: NPO Temperature Spikes Noted: No Respiratory Status: Nasal cannula History of Recent Intubation: Yes Date extubated: 09/06/17 Behavior/Cognition: Alert;Cooperative;Pleasant mood Oral Cavity Assessment: Within Functional Limits Oral Care Completed by SLP: No Oral Cavity - Dentition: Adequate natural dentition Vision: Functional for self-feeding Self-Feeding Abilities: Able to feed self Patient Positioning: Upright in bed Baseline Vocal Quality: Normal Volitional Cough: Strong Volitional Swallow: Able to elicit    Oral/Motor/Sensory Function Overall Oral Motor/Sensory Function: Within functional limits   Ice Chips Ice chips: Within functional limits Presentation: Cup;Spoon   Thin Liquid Thin Liquid: Impaired Presentation: Cup;Straw Pharyngeal  Phase Impairments: Multiple swallows;Change in Vital Signs    Nectar Thick Nectar Thick Liquid: Within functional limits Presentation: Cup;Self Fed   Honey Thick Honey Thick Liquid: Not tested   Puree Puree: Within functional limits Presentation: Spoon   Solid   GO   Solid: Within functional limits Presentation: Spoon    Functional Limitations: Swallowing Swallow Current Status (W7371): At least 20 percent but less than 40 percent impaired, limited or restricted Swallow Goal Status [phone]): At least 1 percent but less than 20 percent impaired, limited or restricted   [employer] 09/07/2017,10:34 AM

## 2017-09-07 NOTE — Progress Notes (Signed)
[facility] Progess Note    SYNOPSIS   Acute hypoxemic respiratory failure with extensive bilateral pulmonary infiltrates  edema vs ARDS. Failed bipap; intubated  9/13. Extubated 09/19. Hypoactive delirium and profound deconditioning.   ASSESSMENT/PLAN   ASSESSMENT / PLAN: Ventilator dependent respiratory failure - resolved Bilateral pulmonary infiltrates - suspect components of both edema versus ARDS-- Improving Suspected pneumonia, NOS - now fully treated-- off abx.  AKI - nonoliguric. Creatinine improving 2.06>>1.69 Hypernatremia Mild Hyperkalemia  ICU acquired anemia without overt blood loss Acute encephalopathy, hypoactive delirium; MRI 9/20; no acute change, tiny remote lacunar infarct.   Supplemental nasal cannula oxygen as needed to maintain SPO2 >92% Monitor temp, WBC count Micro and abx as above - antibiotics discontinued 09/17 restart diuresis Monitor BMET intermittently Monitor I/Os Correct electrolytes as indicated  D5W initiated 09/19-- needs free water replacement.  DVT px: Enoxaparin Monitor CBC intermittently   CXR images personally reviewed; increasing pulm edema.  INTAKE / OUTPUT:  Intake/Output Summary (Last 24 hours) at 09/07/17 1132 Last data filed at 09/07/17 0600  Gross per 24 hour  Intake           1332.5 ml  Output             3185 ml  Net          -1852.5 ml    Micro/culture results:  BCx2 9/10; NTD UC -- Sputum--  Antibiotics:  MAJOR EVENTS/TEST RESULTS:   Best Practices  DVT Prophylaxis: enoxaparin GI Prophylaxis: --   ---------------------------------------   ----------------------------------------   Name: Morgan [name] MRN: [id number] DOB: March 05, 1951    ADMISSION DATE:  08/26/2017      SUBJECTIVE:   Pt awake, alert, follows commands, remains confused, no new complaints.    Review of Systems:  Constitutional: Feels well. Cardiovascular: No chest pain.  Pulmonary: Denies dyspnea.     10 point review of systems was negative  other than what is documented in the HPI.    VITAL SIGNS: Temp:  [98.1 F (36.7 C)-98.7 F (37.1 C)] 98.1 F (36.7 C) (09/22 0800) Pulse Rate:  [71-98] 84 (09/22 0800) Resp:  [21-38] 34 (09/22 0800) BP: (135-163)/(69-96) 151/92 (09/22 0800) SpO2:  [93 %-96 %] 95 % (09/22 0800) Weight:  [197 lb 5 oz (89.5 kg)] 197 lb 5 oz (89.5 kg) (09/22 0448)     PHYSICAL EXAMINATION: Physical Examination:   VS: BP (!) 151/92   Pulse 84   Temp 98.1 F (36.7 C)   Resp (!) 34   Ht 6\' 1"  (1.854 m)   Wt 197 lb 5 oz (89.5 kg)   SpO2 95%   BMI 26.03 kg/m   General Appearance: No distress  Neuro:without focal findings, mental status normal, confused.  HEENT: PERRLA, EOM intact. Pulmonary: normal breath sounds   CardiovascularNormal S1,S2.  No m/r/g.   Abdomen: Benign, Soft, non-tender. Renal:  No costovertebral tenderness  GU:  Not performed at this time. Endocrine: No evident thyromegaly. Skin:   warm, no rashes, no ecchymosis  Extremities: normal, no cyanosis, clubbing.    LABORATORY PANEL:   CBC  Recent Labs Lab 09/07/17 0443  WBC 14.6*  HGB 9.7*  HCT 29.5*  PLT 512*    Chemistries   Recent Labs Lab 09/03/17 0441  09/06/17 1936 09/07/17 0443  NA 143  < > 147* 149*  K 4.9  < > 3.3* 3.9  CL 100*  < > 100* 105  CO2 31  < > 36* 35*  GLUCOSE  119*  < > 116* 118*  BUN 78*  < > 59* 56*  CREATININE 2.32*  < > 1.70* 1.70*  CALCIUM 8.8*  < > 8.7* 8.6*  MG  --   --  2.2  --   PHOS  --   --  3.8  --   AST 58*  --   --   --   ALT 77*  --   --   --   ALKPHOS 175*  --   --   --   BILITOT 0.7  --   --   --   < > = values in this interval not displayed.   Recent Labs Lab 09/06/17 0011 09/06/17 0532 09/06/17 1132 09/06/17 1744 09/06/17 2357 09/07/17 0604  GLUCAP 109* 110* 119* 105* 108* 108*   No results for input(s): PHART, PCO2ART, PO2ART in the last 168 hours.  Recent Labs Lab 09/03/17 0441  AST 58*  ALT 77*   ALKPHOS 175*  BILITOT 0.7  ALBUMIN 2.2*    Cardiac Enzymes No results for input(s): TROPONINI in the last 168 hours.  RADIOLOGY:  Mr Brain Wo Contrast  Result Date: 09/05/2017 CLINICAL DATA:  Initial evaluation for acute altered mental status, unresponsiveness. EXAM: MRI HEAD WITHOUT CONTRAST TECHNIQUE: Multiplanar, multiecho pulse sequences of the brain and surrounding structures were obtained without intravenous contrast. COMPARISON:  Prior CT from 09/04/2017. FINDINGS: Brain: Study degraded by motion. Cerebral volume within normal limits for age. No significant cerebral white matter changes identified. No abnormal foci of restricted diffusion to suggest acute or subacute ischemia. Gray-white matter differentiation maintained. Tiny remote lacunar infarct noted within the left thalamus (series 10, image 26). No other evidence for chronic infarction. No susceptibility artifact to suggest acute or chronic intracranial hemorrhage. No mass lesion, midline shift or mass effect. No hydrocephalus. No extra-axial fluid collection. Major dural sinuses are patent. Pituitary gland suprasellar region normal. Midline structures intact and normal. Vascular: Major intracranial vascular flow voids well maintained. Skull and upper cervical spine: Craniocervical junction normal. Mild degenerate spondylolysis noted within the upper cervical spine without stenosis. Bone marrow signal intensity normal. No scalp soft tissue abnormality. Sinuses/Orbits: Globes and orbital soft tissues within normal limits. Paranasal sinuses clear. Small bilateral mastoid effusions, of doubtful significance. Inner ear structures normal. Other: None. IMPRESSION: 1. No acute intracranial abnormality. 2. Tiny remote left thalamic lacunar infarct. 3. Otherwise normal brain MRI. Electronically Signed   By: Jeannine Boga M.D.   On: 09/05/2017 16:47   Dg Chest Port 1 View  Result Date: 09/06/2017 CLINICAL DATA:  Respiratory failure. EXAM:  PORTABLE CHEST 1 VIEW COMPARISON:  09/04/2017. FINDINGS: Interim extubation and removal of NG tube. Right PICC line noted with tip over right atrium. Cardiomegaly. Diffuse progressive bilateral from interstitial prominence noted most consistent interstitial edema. Bilateral pneumonia cannot be excluded. Small bilateral pleural effusions. Biapical pleural thickening consistent scarring again noted. No pneumothorax. IMPRESSION: 1. Interim extubation and removal of NG tube. Right PICC line in stable position. 2. Cardiomegaly with diffuse bilateral pulmonary infiltrates, progressed from prior exam. Small bilateral pleural effusions. Findings most consistent with congestive heart failure. Bilateral pneumonia cannot be excluded. Electronically Signed   By: Marcello Moores  Register   On: 09/06/2017 06:18        --Marda Stalker, MD.  ICU Pager: [phone] [facility] Office Number: [phone]   09/07/2017

## 2017-09-08 LAB — BASIC METABOLIC PANEL
ANION GAP: 9 (ref 5–15)
BUN: 46 mg/dL — ABNORMAL HIGH (ref 6–20)
CALCIUM: 8.5 mg/dL — AB (ref 8.9–10.3)
CHLORIDE: 104 mmol/L (ref 101–111)
CO2: 33 mmol/L — AB (ref 22–32)
Creatinine, Ser: 1.45 mg/dL — ABNORMAL HIGH (ref 0.44–1.00)
GFR calc non Af Amer: 37 mL/min — ABNORMAL LOW (ref >60)
GFR, EST AFRICAN AMERICAN: 43 mL/min — AB (ref >60)
Glucose, Bld: 111 mg/dL — ABNORMAL HIGH (ref 65–99)
Potassium: 4 mmol/L (ref 3.5–5.1)
Sodium: 146 mmol/L — ABNORMAL HIGH (ref 135–145)

## 2017-09-08 LAB — CBC
HEMATOCRIT: 30 % — AB (ref 35.0–47.0)
HEMOGLOBIN: 9.7 g/dL — AB (ref 12.0–16.0)
MCH: 25.8 pg — ABNORMAL LOW (ref 26.0–34.0)
MCHC: 32.2 g/dL (ref 32.0–36.0)
MCV: 80.2 fL (ref 80.0–100.0)
Platelets: 461 10*3/uL — ABNORMAL HIGH (ref 150–440)
RBC: 3.74 MIL/uL — ABNORMAL LOW (ref 3.80–5.20)
RDW: 14.9 % — ABNORMAL HIGH (ref 11.5–14.5)
WBC: 14.3 10*3/uL — AB (ref 3.6–11.0)

## 2017-09-08 LAB — GLUCOSE, CAPILLARY
GLUCOSE-CAPILLARY: 112 mg/dL — AB (ref 65–99)
GLUCOSE-CAPILLARY: 104 mg/dL — AB (ref 65–99)
Glucose-Capillary: 101 mg/dL — ABNORMAL HIGH (ref 65–99)
Glucose-Capillary: 114 mg/dL — ABNORMAL HIGH (ref 65–99)
Glucose-Capillary: 123 mg/dL — ABNORMAL HIGH (ref 65–99)

## 2017-09-08 MED ORDER — POTASSIUM CHLORIDE 2 MEQ/ML IV SOLN
INTRAVENOUS | Status: DC
  Administered 2017-09-08 – 2017-09-10 (×5): via INTRAVENOUS
  Filled 2017-09-08 (×6): qty 1000

## 2017-09-08 MED ORDER — INSULIN ASPART 100 UNIT/ML ~~LOC~~ SOLN: 0.0000 [IU] | Freq: Every day | SUBCUTANEOUS | Status: DC

## 2017-09-08 MED ORDER — INSULIN ASPART 100 UNIT/ML ~~LOC~~ SOLN
0.0000 [IU] | Freq: Three times a day (TID) | SUBCUTANEOUS | Status: DC
  Administered 2017-09-09: 18:00:00 1 [IU] via SUBCUTANEOUS
  Filled 2017-09-08: qty 1

## 2017-09-08 NOTE — Progress Notes (Signed)
[facility] [place] NAME: Morgan [name]    MR#:  [id number]  DATE OF BIRTH:  [birth year]  SUBJECTIVE:   Patient here due to acute respiratory failure secondary to pneumonia/CHF. Extubated and doing better. Mental status improving but still seems lethargic and PO intake poor.   REVIEW OF SYSTEMS:    Review of Systems  Constitutional: Negative for chills and fever.  HENT: Negative for congestion and tinnitus.   Eyes: Negative for blurred vision and double vision.  Respiratory: Negative for cough, shortness of breath and wheezing.   Cardiovascular: Negative for chest pain, orthopnea and PND.  Gastrointestinal: Negative for abdominal pain, diarrhea, nausea and vomiting.  Genitourinary: Negative for dysuria and hematuria.  Neurological: Positive for weakness. Negative for dizziness, sensory change and focal weakness.  All other systems reviewed and are negative.   Nutrition: Dysphagia II with nectar thick liquids Tolerating Diet: Yes Tolerating PT: Await Eval.   DRUG ALLERGIES:   Allergies  Allergen Reactions  . Demerol [Meperidine]     Hives   . Strawberry (Diagnostic) Hives    VITALS:  Blood pressure 139/72, pulse 68, temperature 98.4 F (36.9 C), resp. rate 20, height 6\' 1"  (1.854 m), weight 89 kg (196 lb 4.8 oz), SpO2 98 %.  PHYSICAL EXAMINATION:   Physical Exam  GENERAL:  66 y.o.-year-old patient lying in bed in no acute distress.  EYES: Pupils equal, round, reactive to light and accommodation. No scleral icterus. Extraocular muscles intact.  HEENT: Head atraumatic, normocephalic. Oropharynx and nasopharynx clear.  NECK:  Supple, no jugular venous distention. No thyroid enlargement, no tenderness.  LUNGS: Poor Resp. effort, no wheezing, rales, rhonchi. No use of accessory muscles of respiration.  CARDIOVASCULAR: S1, S2 normal. No murmurs, rubs, or gallops.  ABDOMEN: Soft, nontender, nondistended. Bowel sounds present. No  organomegaly or mass.  EXTREMITIES: No cyanosis, clubbing or edema b/l.    NEUROLOGIC: Cranial nerves II through XII are intact. No focal Motor or sensory deficits b/l.  Globally weak PSYCHIATRIC: The patient is alert and oriented x 2.  SKIN: No obvious rash, lesion, or ulcer.    LABORATORY PANEL:   CBC  Recent Labs Lab 09/08/17 0510  WBC 14.3*  HGB 9.7*  HCT 30.0*  PLT 461*   ------------------------------------------------------------------------------------------------------------------  Chemistries   Recent Labs Lab 09/03/17 0441  09/06/17 1936  09/08/17 0510  NA 143  < > 147*  < > 146*  K 4.9  < > 3.3*  < > 4.0  CL 100*  < > 100*  < > 104  CO2 31  < > 36*  < > 33*  GLUCOSE 119*  < > 116*  < > 111*  BUN 78*  < > 59*  < > 46*  CREATININE 2.32*  < > 1.70*  < > 1.45*  CALCIUM 8.8*  < > 8.7*  < > 8.5*  MG  --   --  2.2  --   --   AST 58*  --   --   --   --   ALT 77*  --   --   --   --   ALKPHOS 175*  --   --   --   --   BILITOT 0.7  --   --   --   --   < > = values in this interval not displayed. ------------------------------------------------------------------------------------------------------------------  Cardiac Enzymes No results for input(s): TROPONINI in the last 168 hours. ------------------------------------------------------------------------------------------------------------------  RADIOLOGY:  No results found.   ASSESSMENT AND PLAN:   66 year old female with past medical history of hypertension, depression, anxiety, osteoarthritis who presented to the hospital due to shortness of breath and noted to be in acute respiratory failure secondary to pneumonia.  1. Acute respiratory failure with hypoxia-secondary to multifocal pneumonia/CHF/Pulm edema. Initially patient was intubated but now extubated and doing well. - patient was on broad-spectrum IV antibiotics but now have been discontinued as pneumonia has been ruled out. Also given IV diuresis  and responded to it and her hypoxia is improved and she is extubated. - no acute issue and doing well from resp. Standpoint.   2.sepsis-initially thought to be secondary to pneumonia. Now has been ruled out. Patient off antibiotics. Lactic acid has normalized.  3. Hypernatremia-continue D5W.  - sodium level is improving  4. Hypokalemia-improving with supplementation will continue to monitor.  5. Altered mental status-etiology unclear but suspected to be metabolic encephalopathy. CT scan, MRI of the brain  negative for acute neurologic process.  - continues to remain somewhat lethargic but mental status has improved since admission  6. Depression-continue Abilify, Prozac.  Encourage PO intake, PT eval pending.   All the records are reviewed and case discussed with Care Management/Social Worker. Management plans discussed with the patient, family and they are in agreement.  CODE STATUS: Full code  DVT Prophylaxis: Lovenox  TOTAL TIME TAKING CARE OF THIS PATIENT: 30 minutes.   POSSIBLE D/C IN 2-3 DAYS, DEPENDING ON CLINICAL CONDITION.   Henreitta Leber M.D on 09/08/2017 at 11:40 AM  Between 7am to 6pm - Pager - [phone]  After 6pm go to [email or web address] - Proofreader  [facility]  Office  [phone]  CC: Primary care physician; Javier Docker, MD

## 2017-09-08 NOTE — Progress Notes (Signed)
Physical Therapy Evaluation Patient Details Name: Morgan [name] MRN: [id number] DOB: 31-Oct-1951 Today's Date: 09/08/2017   History of Present Illness  66 yo female with admission 2 weeks ago for pulm effusion/infiltrates with CHF and suspected PNA leading to sepsis and AMS, intubated finally and just extubated in last 24 hours. (-) brain MRI.  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  PT blocked her L knee to avoid pt trying to move off bed and then pt was assisted to shift to L.  Her plan is to work on sit balance and control to try to stand for progressing to SNF therapy goals.    Follow Up Recommendations SNF    Equipment Recommendations  None recommended by PT    Recommendations for Other Services       Precautions / Restrictions Precautions Precautions: Fall (telemetry) Restrictions Weight Bearing Restrictions: No      Mobility  Bed Mobility Overal bed mobility: Needs Assistance Bed Mobility: Supine to Sit;Sit to Supine     Supine to sit: Mod assist;Max assist Sit to supine: Max assist   General bed mobility comments: pt is not helping move limbs well and once sitting likes to list to R side  Transfers                 General transfer comment: unable to attempt as pt is too weak and has list to R, resisted PT trying to lean to L side  Ambulation/Gait             General Gait Details: unable  Stairs            Wheelchair Mobility    Modified Rankin (Stroke Patients Only)       Balance Overall balance assessment:  (Pt is leaning to R and needs mod to max assist to lean to L )                                           Pertinent Vitals/Pain Pain Assessment: No/denies pain    Home Living Family/patient expects to be discharged to:: Unsure Living Arrangements:  Non-relatives/Friends               Additional Comments: Pt is not able to give an accurate history    Prior Function           Comments: unsure of PLOF     Hand Dominance        Extremity/Trunk Assessment   Upper Extremity Assessment Upper Extremity Assessment: Generalized weakness    Lower Extremity Assessment Lower Extremity Assessment: Generalized weakness    Cervical / Trunk Assessment Cervical / Trunk Assessment: Normal  Communication   Communication: Expressive difficulties  Cognition Arousal/Alertness: Lethargic Behavior During Therapy: Flat affect Overall Cognitive Status: No family/caregiver present to determine baseline cognitive functioning                                 General Comments: pt is demonstrating inability to move limbs without full support and cues      General Comments      Exercises     Assessment/Plan    PT Assessment Patient needs  continued PT services  PT Problem List Decreased strength;Decreased activity tolerance;Decreased range of motion;Decreased balance;Decreased mobility;Decreased coordination;Decreased knowledge of use of DME;Decreased safety awareness;Decreased knowledge of precautions;Cardiopulmonary status limiting activity       PT Treatment Interventions DME instruction;Gait training;Stair training;Functional mobility training;Therapeutic activities;Therapeutic exercise;Balance training;Neuromuscular re-education;Patient/family education    PT Goals (Current goals can be found in the Care Plan section)  Acute Rehab PT Goals Patient Stated Goal: to get onto [facility] PT Goal Formulation: Patient unable to participate in goal setting Time For Goal Achievement: 09/29/17 Potential to Achieve Goals: Good    Frequency Min 2X/week   Barriers to discharge Other (comment) (no clear information from pt to know what she faces at home)      Co-evaluation               AM-PAC PT "6 Clicks" Daily  Activity  Outcome Measure Difficulty turning over in bed (including adjusting bedclothes, sheets and blankets)?: Unable Difficulty moving from lying on back to sitting on the side of the bed? : Unable Difficulty sitting down on and standing up from a chair with arms (e.g., wheelchair, bedside commode, etc,.)?: Unable Help needed moving to and from a bed to chair (including a wheelchair)?: Total Help needed walking in hospital room?: Total Help needed climbing 3-5 steps with a railing? : Total 6 Click Score: 6    End of Session Equipment Utilized During Treatment: Gait belt;Oxygen (2L since her extubation) Activity Tolerance: Patient limited by fatigue;Other (comment) (inability to control awareness of midline) Patient left: in bed;with call bell/phone within reach;with bed alarm set Nurse Communication: Mobility status;Need for lift equipment PT Visit Diagnosis: Muscle weakness (generalized) (M62.81);Other abnormalities of gait and mobility (R26.89);Adult, failure to thrive (R62.7);Hemiplegia and hemiparesis Hemiplegia - Right/Left: Left Hemiplegia - dominant/non-dominant: Non-dominant Hemiplegia - caused by: Unspecified    Time: 0454-0981 PT Time Calculation (min) (ACUTE ONLY): 32 min   Charges:   PT Evaluation $PT Eval Moderate Complexity: 1 Mod PT Treatments $Therapeutic Activity: 8-22 mins   PT G Codes:   PT G-Codes **NOT FOR INPATIENT CLASS** Functional Assessment Tool Used: AM-PAC 6 Clicks Basic Mobility    Ramond Dial 09/08/2017, 4:35 PM   Mee Hives, PT MS Acute Rehab Dept. Number: [place] [facility] [place]

## 2017-09-09 LAB — BASIC METABOLIC PANEL
ANION GAP: 8 (ref 5–15)
BUN: 35 mg/dL — ABNORMAL HIGH (ref 6–20)
CALCIUM: 8.4 mg/dL — AB (ref 8.9–10.3)
CHLORIDE: 102 mmol/L (ref 101–111)
CO2: 32 mmol/L (ref 22–32)
Creatinine, Ser: 1.21 mg/dL — ABNORMAL HIGH (ref 0.44–1.00)
GFR calc non Af Amer: 46 mL/min — ABNORMAL LOW (ref >60)
GFR, EST AFRICAN AMERICAN: 54 mL/min — AB (ref >60)
GLUCOSE: 109 mg/dL — AB (ref 65–99)
Potassium: 3.6 mmol/L (ref 3.5–5.1)
Sodium: 142 mmol/L (ref 135–145)

## 2017-09-09 LAB — GLUCOSE, CAPILLARY
GLUCOSE-CAPILLARY: 114 mg/dL — AB (ref 65–99)
GLUCOSE-CAPILLARY: 130 mg/dL — AB (ref 65–99)
Glucose-Capillary: 102 mg/dL — ABNORMAL HIGH (ref 65–99)
Glucose-Capillary: 92 mg/dL (ref 65–99)

## 2017-09-09 NOTE — Progress Notes (Signed)
Nutrition Follow-up  DOCUMENTATION CODES:   Non-severe (moderate) malnutrition in context of acute illness/injury  INTERVENTION:  Recommend placement of small-bore NGT for initiation of enteral nutrition. Patient is unable to meet her needs with PO intake. Short-term tube feeds may stimulate patient's appetite and desire to eat. If her altered mental status is expected to last longer than 30 days, may need to consider placement of G-tube for long-term enteral nutrition and PMT consult. Plan is for MD to discuss with patient tomorrow.  In the meantime, will provide Magic Cup (290 kcal, 9 grams of protein) and Mighty Shake II (480-500 kcal, 20-23 grams of protein) on each tray. Not likely to improve intake as patient is not eating.  NUTRITION DIAGNOSIS:   Malnutrition (Moderate) related to acute illness (AMS following extubation, inadequate oral intake) as evidenced by 1.5 percent weight loss over 1 week, energy intake < or equal to 50% for > or equal to 5 days.  New nutrition diagnosis.  GOAL:   Patient will meet greater than or equal to 90% of their needs  Not met. Patient consuming 0% of meals.  MONITOR:   PO intake, Supplement acceptance, Diet advancement, Labs, Weight trends, I & O's  REASON FOR ASSESSMENT:   Consult Enteral/tube feeding initiation and management  ASSESSMENT:   66 year old female with PMHx of anxiety, depression, HTN, OA who presented with SOB and found to have acute respiratory failure with hypoxia. Patient was intubated 9/13-9/19. Patient has had AMS following extubation thought to be metabolic in nature.  -Rectal tube was removed on 9/21. -On 9/22 patient was advanced to dysphagia 2 diet with nectar-thick liquids following SLP evaluation.  Met with patient at bedside. She remains confused and somewhat lethargic. No family members present at time of assessment. Patient had not touched tray at bedside. When first asked how she was eating, patient reported  she was eating well. Then discussed that she actually was not eating well. Discussed that she has eaten at most only bites of meals since she was advanced to a diet 2 days ago. Patient reports she just doesn't want to eat. Denied any N/V, abdominal pain, or any other issues impacting intake. Offered to assist patient with eating meal at bedside, but she refused saying she did not want anything to eat.  Meal Completion: 0% to bites  Medications reviewed and include: Novolog 0-9 units TID, Novolog 0-5 units QHS, D5W with Potassium Chloride 20 mEq @ 75 ml/hr (90 grams dextrose, 306 kcal daily).  Labs reviewed: Sodium now WNL at 142 following D5W w/ KCl, Glucose 101-114 past 24 hrs, BUN 35, Creatinine 1.21.  I/O 0700 9/23 to 0700 9/24: 250 ml UOP + 2 occurrences unmeasured output (catheter removed); one bowel movement  Weight trend: 89.4 kg on 9/24; weight trending back down following diuresis from highest weight of 98.8 kg on 9/17; per chart review it appears Lasix was discontinued on 9/19; since 9/20 patient has lost 1.4 kg (1.5% body weight) over <1 week, which is significant for time frame  Patient now meets criteria for moderate acute malnutrition in setting of 1.5% weight loss over 1 week, energy intake </=50% of estimated energy requirements for >/=5 days. May progress to severe acute malnutrition if patient continues to have inadequate intake and lose weight.  Diet Order:  DIET DYS 2 Room service appropriate? Yes; Fluid consistency: Nectar Thick  Skin:  Wound (see comment) (MSAD to bilateral groin/thighs, skin tear to left arm)  Last BM:  09/08/2017 -  medium type 7  Height:   Ht Readings from Last 1 Encounters:  08/26/17 6' 1"  (1.854 m)    Weight:   Wt Readings from Last 1 Encounters:  09/09/17 197 lb 1.6 oz (89.4 kg)    Ideal Body Weight:  75 kg  BMI:  Body mass index is 26 kg/m.  Estimated Nutritional Needs:   Kcal:  1900-2200 (MSJ x 1.2-1.4)  Protein:  90-110 grams  (1-1.2 grams/kg)  Fluid:  2.2 L/day (25 ml/kg)  EDUCATION NEEDS:   Education needs no appropriate at this time  Willey Blade, MS, RD, LDN Pager: [phone] After Hours Pager: [phone]

## 2017-09-09 NOTE — Progress Notes (Signed)
[place] at [place] NAME: Morgan [name]    MR#:  [id number]  DATE OF BIRTH:  [birth year]  SUBJECTIVE:   No acute events overnight.  Mental status improving.  PO intake remains poor.    REVIEW OF SYSTEMS:    Review of Systems  Constitutional: Negative for chills and fever.  HENT: Negative for congestion and tinnitus.   Eyes: Negative for blurred vision and double vision.  Respiratory: Negative for cough, shortness of breath and wheezing.   Cardiovascular: Negative for chest pain, orthopnea and PND.  Gastrointestinal: Negative for abdominal pain, diarrhea, nausea and vomiting.  Genitourinary: Negative for dysuria and hematuria.  Neurological: Positive for weakness. Negative for dizziness, sensory change and focal weakness.  All other systems reviewed and are negative.   Nutrition: Dysphagia II with nectar thick liquids Tolerating Diet: Yes but poor. Tolerating PT: Eval noted.  DRUG ALLERGIES:   Allergies  Allergen Reactions  . Demerol [Meperidine]     Hives   . Strawberry (Diagnostic) Hives    VITALS:  Blood pressure 105/71, pulse 68, temperature 98 F (36.7 C), resp. rate 20, height 6\' 1"  (1.854 m), weight 89.4 kg (197 lb 1.6 oz), SpO2 99 %.  PHYSICAL EXAMINATION:   Physical Exam  GENERAL:  66 y.o.-year-old patient lying in bed in no acute distress.  EYES: Pupils equal, round, reactive to light and accommodation. No scleral icterus. Extraocular muscles intact.  HEENT: Head atraumatic, normocephalic. Oropharynx and nasopharynx clear.  NECK:  Supple, no jugular venous distention. No thyroid enlargement, no tenderness.  LUNGS: Poor Resp. effort, no wheezing, rales, rhonchi. No use of accessory muscles of respiration.  CARDIOVASCULAR: S1, S2 normal. No murmurs, rubs, or gallops.  ABDOMEN: Soft, nontender, nondistended. Bowel sounds present. No organomegaly or mass.  EXTREMITIES: No cyanosis, clubbing or edema b/l.    NEUROLOGIC:  Cranial nerves II through XII are intact. No focal Motor or sensory deficits b/l.  Globally weak PSYCHIATRIC: The patient is alert and oriented x 2.  SKIN: No obvious rash, lesion, or ulcer.    LABORATORY PANEL:   CBC  Recent Labs Lab 09/08/17 0510  WBC 14.3*  HGB 9.7*  HCT 30.0*  PLT 461*   ------------------------------------------------------------------------------------------------------------------  Chemistries   Recent Labs Lab 09/03/17 0441  09/06/17 1936  09/09/17 0426  NA 143  < > 147*  < > 142  K 4.9  < > 3.3*  < > 3.6  CL 100*  < > 100*  < > 102  CO2 31  < > 36*  < > 32  GLUCOSE 119*  < > 116*  < > 109*  BUN 78*  < > 59*  < > 35*  CREATININE 2.32*  < > 1.70*  < > 1.21*  CALCIUM 8.8*  < > 8.7*  < > 8.4*  MG  --   --  2.2  --   --   AST 58*  --   --   --   --   ALT 77*  --   --   --   --   ALKPHOS 175*  --   --   --   --   BILITOT 0.7  --   --   --   --   < > = values in this interval not displayed. ------------------------------------------------------------------------------------------------------------------  Cardiac Enzymes No results for input(s): TROPONINI in the last 168 hours. ------------------------------------------------------------------------------------------------------------------  RADIOLOGY:  No results found.   ASSESSMENT AND PLAN:  66 year old female with past medical history of hypertension, depression, anxiety, osteoarthritis who presented to the hospital due to shortness of breath and noted to be in acute respiratory failure secondary to pneumonia.  1. Acute respiratory failure with hypoxia-secondary to multifocal pneumonia/CHF/Pulm edema. Initially patient was intubated but now extubated and doing well. - patient was on broad-spectrum IV antibiotics but now have been discontinued as pneumonia has been ruled out. Also given IV diuresis and responded to it and her hypoxia has improved and she is extubated. - no acute issue and  doing well from resp. Standpoint.   2.sepsis-initially thought to be secondary to pneumonia. Now has been ruled out. Patient off antibiotics. Lactic acid has normalized.  3. Hypernatremia-continue D5W.  - sodium level normalized now.  4. Hypokalemia- resolved w/ supplementation.   5. Altered mental status-etiology unclear but suspected to be metabolic encephalopathy. CT scan, MRI of the brain  negative for acute neurologic process.  - appreciate Neuro input and likely metabolic in nature and improving.  Close to baseline now.   6. Depression-continue Abilify, Prozac.  Encourage PO intake, PT recommending SNF/STR and Social work made aware.   All the records are reviewed and case discussed with Care Management/Social Worker. Management plans discussed with the patient, family and they are in agreement.  CODE STATUS: Full code  DVT Prophylaxis: Lovenox  TOTAL TIME TAKING CARE OF THIS PATIENT: 30 minutes.   POSSIBLE D/C IN 1-2 DAYS, DEPENDING ON CLINICAL CONDITION.   Henreitta Leber M.D on 09/09/2017 at 1:05 PM  Between 7am to 6pm - Pager - [phone]  After 6pm go to [email or web address] - Proofreader  [facility]  Office  [phone]  CC: Primary care physician; Javier Docker, MD

## 2017-09-09 NOTE — Progress Notes (Signed)
Subjective: Patient more alert  Objective: Current vital signs: BP 131/66 (BP Location: Left Arm)   Pulse 69   Temp 98.1 F (36.7 C) (Oral)   Resp 20   Ht 6\' 1"  (1.854 m)   Wt 89.4 kg (197 lb 1.6 oz)   SpO2 99%   BMI 26.00 kg/m  Vital signs in last 24 hours: Temp:  [98 F (36.7 C)-98.8 F (37.1 C)] 98.1 F (36.7 C) (09/24 2017) Pulse Rate:  [68-84] 69 (09/24 2017) Resp:  [20] 20 (09/24 2017) BP: (105-152)/(66-87) 131/66 (09/24 2017) SpO2:  [98 %-99 %] 99 % (09/24 2017) Weight:  [89.4 kg (197 lb 1.6 oz)] 89.4 kg (197 lb 1.6 oz) (09/24 0500)  Intake/Output from previous day: 09/23 0701 - 09/24 0700 In: 2773.9 [I.V.:2773.9] Out: 250 [Urine:250] Intake/Output this shift: No intake/output data recorded. Nutritional status: DIET DYS 2 Room service appropriate? Yes; Fluid consistency: Nectar Thick  Neurologic Exam: Mental Status:  Alert, oriented to name, place, year but not month.  Knows age.  Speech fluent without evidence of aphasia.  Able to follow 3 step commands without difficulty. Cranial Nerves: II: Discs flat bilaterally; Visual fields grossly normal, pupils equal, round, reactive to light and accommodation III,IV, VI: ptosis not present, extra-ocular motions intact bilaterally V,VII: smile symmetric, facial light touch sensation normal bilaterally VIII: hearing normal bilaterally IX,X: gag reflex present XI: bilateral shoulder shrug XII: midline tongue extension Motor: Moves all extremities weakly against gravity.  No focal weakness noted.  Tremor noted in the LUE.     Lab Results: Basic Metabolic Panel:  Recent Labs Lab 09/06/17 0407 09/06/17 1936 09/07/17 0443 09/08/17 0510 09/09/17 0426  NA 152* 147* 149* 146* 142  K 3.4* 3.3* 3.9 4.0 3.6  CL 103 100* 105 104 102  CO2 38* 36* 35* 33* 32  GLUCOSE 117* 116* 118* 111* 109*  BUN 68* 59* 56* 46* 35*  CREATININE 1.69* 1.70* 1.70* 1.45* 1.21*  CALCIUM 8.8* 8.7* 8.6* 8.5* 8.4*  MG  --  2.2  --   --    --   PHOS  --  3.8  --   --   --     Liver Function Tests:  Recent Labs Lab 09/03/17 0441  AST 58*  ALT 77*  ALKPHOS 175*  BILITOT 0.7  PROT 6.5  ALBUMIN 2.2*   No results for input(s): LIPASE, AMYLASE in the last 168 hours. No results for input(s): AMMONIA in the last 168 hours.  CBC:  Recent Labs Lab 09/04/17 0538 09/05/17 1006 09/06/17 1936 09/07/17 0443 09/08/17 0510  WBC 13.8* 15.6* 11.5* 14.6* 14.3*  HGB 8.2* 8.6* 9.8* 9.7* 9.7*  HCT 24.8* 26.4* 30.0* 29.5* 30.0*  MCV 80.7 80.4 80.9 79.4* 80.2  PLT 402 450* 512* 512* 461*    Cardiac Enzymes: No results for input(s): CKTOTAL, CKMB, CKMBINDEX, TROPONINI in the last 168 hours.  Lipid Panel: No results for input(s): CHOL, TRIG, HDL, CHOLHDL, VLDL, LDLCALC in the last 168 hours.  CBG:  Recent Labs Lab 09/08/17 2142 09/09/17 0751 09/09/17 1154 09/09/17 1703 09/09/17 2016  GLUCAP 114* 102* 114* 130* 62    Microbiology: Results for orders placed or performed during the hospital encounter of 08/26/17  C difficile quick scan w PCR reflex     Status: None   Collection Time: 08/26/17  8:37 PM  Result Value Ref Range Status   C Diff antigen NEGATIVE NEGATIVE Final   C Diff toxin NEGATIVE NEGATIVE Final   C Diff interpretation No  C. difficile detected.  Final  Culture, blood (Routine X 2) w Reflex to ID Panel     Status: None   Collection Time: 08/26/17  9:48 PM  Result Value Ref Range Status   Specimen Description BLOOD LEFT HAND  Final   Special Requests   Final    BOTTLES DRAWN AEROBIC AND ANAEROBIC Blood Culture adequate volume   Culture NO GROWTH 5 DAYS  Final   Report Status 08/31/2017 FINAL  Final  Culture, blood (Routine X 2) w Reflex to ID Panel     Status: None   Collection Time: 08/26/17  9:48 PM  Result Value Ref Range Status   Specimen Description BLOOD RAC  Final   Special Requests   Final    BOTTLES DRAWN AEROBIC AND ANAEROBIC Blood Culture results may not be optimal due to an excessive  volume of blood received in culture bottles   Culture NO GROWTH 5 DAYS  Final   Report Status 08/31/2017 FINAL  Final  MRSA PCR Screening     Status: None   Collection Time: 08/28/17 11:12 AM  Result Value Ref Range Status   MRSA by PCR NEGATIVE NEGATIVE Final    Comment:        The GeneXpert MRSA Assay (FDA approved for NASAL specimens only), is one component of a comprehensive MRSA colonization surveillance program. It is not intended to diagnose MRSA infection nor to guide or monitor treatment for MRSA infections.   Culture, respiratory (NON-Expectorated)     Status: Abnormal   Collection Time: 08/29/17 10:46 PM  Result Value Ref Range Status   Specimen Description SPUTUM  Final   Special Requests NONE  Final   Gram Stain   Final    RARE WBC PRESENT,BOTH PMN AND MONONUCLEAR NO SQUAMOUS EPITHELIAL CELLS SEEN NO ORGANISMS SEEN Performed at [facility], [address], [place], [place] [address]    Culture MULTIPLE ORGANISMS PRESENT, NONE PREDOMINANT (A)  Final   Report Status 09/01/2017 FINAL  Final    Coagulation Studies: No results for input(s): LABPROT, INR in the last 72 hours.  Imaging: No results found.  Medications:  I have reviewed the patient's current medications. Scheduled: . amLODipine  2.5 mg Oral Daily  . ARIPiprazole  15 mg Oral Daily  . benztropine  0.5 mg Oral BID  . enoxaparin (LOVENOX) injection  40 mg Subcutaneous Q24H  . FLUoxetine  20 mg Oral Daily  . insulin aspart  0-5 Units Subcutaneous QHS  . insulin aspart  0-9 Units Subcutaneous TID WC  . mouth rinse  15 mL Mouth Rinse BID  . sodium chloride flush  10-40 mL Intracatheter Q12H    Assessment/Plan: Mental status slowly improving.  Will continue to follow with you prn.   LOS: 14 days   Alexis Goodell, MD Neurology [phone] 09/09/2017  9:12 PM

## 2017-09-09 NOTE — NC FL2 (Signed)
[place] LEVEL OF CARE SCREENING TOOL     IDENTIFICATION  Patient Name: Morgan [name] Birthdate: 11/28/1951 Sex: female Admission Date (Current Location): 08/26/2017  [place] and [place] Number:  Engineering geologist and Address:  [facility], [address], [place], [place] [address]      Provider Number: [id number]  Attending Physician Name and Address:  Henreitta Leber, MD  Relative Name and Phone Number:       Current Level of Care: Hospital Recommended Level of Care: [place] Prior Approval Number:    Date Approved/Denied:   PASRR Number:  pending  Discharge Plan: SNF    Current Diagnoses: Patient Active Problem List   Diagnosis Date Noted  . Acute respiratory failure ([place])   . Anxiety 08/26/2017  . Sepsis ([place]) 08/26/2017  . HCAP (healthcare-associated pneumonia) 08/26/2017  . Essential hypertension   . Depression   . CAP (community acquired pneumonia) 02/22/2015  . Cough 02/22/2015  . Diarrhea 02/22/2015  . Hypokalemia 02/22/2015  . Tobacco abuse 02/22/2015  . Hypertension   . Mass of breast, right 11/18/2013  . Chronic lower back pain 04/29/2013  . Paresthesia 04/29/2013    Orientation RESPIRATION BLADDER Height & Weight     Self, Time, Situation, Place  O2 (2L) Incontinent Weight: 197 lb 1.6 oz (89.4 kg) Height:  6\' 1"  (185.4 cm)  BEHAVIORAL SYMPTOMS/MOOD NEUROLOGICAL BOWEL NUTRITION STATUS      Incontinent Diet (See DC summary)  Dysphagia 2 (Fine chop);Nectar-thick liquid   Liquid Administration via: Spoon;Cup Medication Administration: Crushed with puree Supervision: Patient able to self feed;Staff to assist with self feeding Compensations: Slow rate;Small sips/bites;Follow solids with liquid Postural Changes: Seated upright at 90 degrees   AMBULATORY STATUS COMMUNICATION OF NEEDS Skin   Extensive Assist Verbally Normal                       Personal Care Assistance Level of  Assistance  Bathing, Feeding, Dressing Bathing Assistance: Maximum assistance Feeding assistance: Limited assistance Dressing Assistance: Maximum assistance     Functional Limitations Info  Sight, Hearing, Speech Sight Info: Adequate Hearing Info: Adequate Speech Info: Adequate    SPECIAL CARE FACTORS FREQUENCY  PT (By licensed PT)     PT Frequency: 2x a week      Speech Language :  4x a week min 2 weeks        Contractures Contractures Info: Not present    Additional Factors Info  Code Status Code Status Info: Full Code             Current Medications (09/09/2017):  This is the current hospital active medication list Current Facility-Administered Medications  Medication Dose Route Frequency Provider Last Rate Last Dose  . acetaminophen (TYLENOL) tablet 650 mg  650 mg Per Tube Q6H PRN Wilhelmina Mcardle, MD   650 mg at 09/07/17 1039   Or  . acetaminophen (TYLENOL) suppository 650 mg  650 mg Rectal Q6H PRN Wilhelmina Mcardle, MD   650 mg at 09/04/17 1957  . amLODipine (NORVASC) tablet 2.5 mg  2.5 mg Oral Daily Henreitta Leber, MD   2.5 mg at 09/09/17 0907  . ARIPiprazole (ABILIFY) tablet 15 mg  15 mg Oral Daily Henreitta Leber, MD   15 mg at 09/09/17 2458  . benztropine (COGENTIN) tablet 0.5 mg  0.5 mg Oral BID Henreitta Leber, MD   0.5 mg at 09/09/17 0906  . dextrose  5 % 1,000 mL with potassium chloride 20 mEq infusion   Intravenous Continuous Henreitta Leber, MD 75 mL/hr at 09/09/17 0007    . docusate (COLACE) 50 MG/5ML liquid 100 mg  100 mg Per Tube BID PRN Laverle Hobby, MD      . enoxaparin (LOVENOX) injection 40 mg  40 mg Subcutaneous Q24H Wilhelmina Mcardle, MD   40 mg at 09/09/17 0906  . FLUoxetine (PROZAC) capsule 20 mg  20 mg Oral Daily Henreitta Leber, MD   20 mg at 09/09/17 0906  . insulin aspart (novoLOG) injection 0-5 Units  0-5 Units Subcutaneous QHS Sainani, Vivek J, MD      . insulin aspart (novoLOG) injection 0-9 Units  0-9 Units Subcutaneous  TID WC Sainani, Belia Heman, MD      . MEDLINE mouth rinse  15 mL Mouth Rinse BID Wilhelmina Mcardle, MD   15 mL at 09/09/17 0915  . ondansetron (ZOFRAN) injection 4 mg  4 mg Intravenous Q6H PRN Varughese, Bincy S, NP   4 mg at 09/03/17 1030  . polyvinyl alcohol (LIQUIFILM TEARS) 1.4 % ophthalmic solution 1 drop  1 drop Both Eyes PRN Awilda Bill, NP   1 drop at 09/06/17 2125  . sodium chloride flush (NS) 0.9 % injection 10-40 mL  10-40 mL Intracatheter Q12H Vaughan Basta, MD   10 mL at 09/09/17 0916  . sodium chloride flush (NS) 0.9 % injection 10-40 mL  10-40 mL Intracatheter PRN Vaughan Basta, MD   10 mL at 08/30/17 2149     Discharge Medications: Please see discharge summary for a list of discharge medications.  Relevant Imaging Results:  Relevant Lab Results:   Additional Information SSN:  [id number]   [name], [place]

## 2017-09-09 NOTE — Clinical Social Work Note (Signed)
Clinical Social Work Assessment  Patient Details  Name: Morgan [name] MRN: [id number] Date of Birth: 02/10/1951  Date of referral:  09/09/17               Reason for consult:  Facility Placement, Discharge Planning                Permission sought to share information with:  Case Manager, Facility Sport and exercise psychologist Permission granted to share information::     Name::        Agency::     Relationship::  [facility], preference for placement  Contact Information:     Housing/Transportation Living arrangements for the past 2 months:  Single Family Home Source of Information:  Patient, Medical Team, Case Manager, Facility Patient Interpreter Needed:  None Criminal Activity/Legal Involvement Pertinent to Current Situation/Hospitalization:  No - Comment as needed Significant Relationships:  Other Family Members Lives with:  Self Do you feel safe going back to the place where you live?  No Need for family participation in patient care:  No (Coment)  Care giving concerns:  Patient admitted from home for fever, cough.  Patient has become progressively weakened and per therapy evals, will benefit from placement.  Patient reports she is in agreement and this will be her first admission to SNF. She reports her father had a good experience with [facility], therefore hopeful for placement with [facility].    Social Worker assessment / plan:  LCSW consulted for SNF placement.  Assessment completed and patient requesting Morgan [name] for discharge. Patient requires insurance auth, thus will send referrals out and follow up with bed offers.  Plan: SNF, barriers: insurance auth and medical clearance  Employment status:  Retired Nurse, adult PT Recommendations:  [place] / Referral to community resources:     Patient/Family's Response to care:  Agreeable to plan  Patient/Family's Understanding of and Emotional Response to  Diagnosis, Current Treatment, and Prognosis:  Patient was very lethargic in assessment, however agreeable and aware of plans for SNF. She voices understanding of her needs and decrease in strength.   Emotional Assessment Appearance:  Appears stated age Attitude/Demeanor/Rapport:    Affect (typically observed):  Accepting, Adaptable Orientation:  Oriented to Self, Oriented to Place, Oriented to  Time, Oriented to Situation Alcohol / Substance use:  Not Applicable Psych involvement (Current and /or in the community):  No (Comment)  Discharge Needs  Concerns to be addressed:  No discharge needs identified Readmission within the last 30 days:  No Current discharge risk:  None Barriers to Discharge:  Continued Medical Work up, [place], [place], [place] 09/09/2017, 1:00 PM

## 2017-09-10 LAB — GLUCOSE, CAPILLARY
Glucose-Capillary: 99 mg/dL (ref 65–99)
Glucose-Capillary: 96 mg/dL (ref 65–99)
Glucose-Capillary: 95 mg/dL (ref 65–99)
Glucose-Capillary: 116 mg/dL — ABNORMAL HIGH (ref 65–99)

## 2017-09-10 LAB — POCT CBG MONITORING: CBG: 95

## 2017-09-10 MED ORDER — ALUM & MAG HYDROXIDE-SIMETH 200-200-20 MG/5ML PO SUSP
30.0000 mL | ORAL | Status: DC | PRN
  Administered 2017-09-10: 30 mL via ORAL
  Filled 2017-09-10: qty 30

## 2017-09-10 NOTE — Plan of Care (Signed)
Problem: Activity: Goal: Risk for activity intolerance will decrease Outcome: Progressing 1 assist to [facility]  Problem: Nutrition: Goal: Adequate nutrition will be maintained Outcome: Progressing Patient's po intake has been better today

## 2017-09-10 NOTE — Progress Notes (Signed)
[place] at [place] NAME: Morgan [name]    MR#:  [id number]  DATE OF BIRTH:  1951/05/11  SUBJECTIVE:   PO intake remains poor and will have speech see pt. To see if diet can be advanced.   REVIEW OF SYSTEMS:    Review of Systems  Constitutional: Negative for chills and fever.  HENT: Negative for congestion and tinnitus.   Eyes: Negative for blurred vision and double vision.  Respiratory: Negative for cough, shortness of breath and wheezing.   Cardiovascular: Negative for chest pain, orthopnea and PND.  Gastrointestinal: Negative for abdominal pain, diarrhea, nausea and vomiting.  Genitourinary: Negative for dysuria and hematuria.  Neurological: Positive for weakness. Negative for dizziness, sensory change and focal weakness.  All other systems reviewed and are negative.   Nutrition: Dysphagia II with nectar thick liquids Tolerating Diet: Yes but poor. Tolerating PT: Eval noted.  DRUG ALLERGIES:   Allergies  Allergen Reactions  . Demerol [Meperidine]     Hives   . Strawberry (Diagnostic) Hives    VITALS:  Blood pressure 126/73, pulse 73, temperature 98 F (36.7 C), temperature source Oral, resp. rate 16, height 6\' 1"  (1.854 m), weight 88.3 kg (194 lb 11.2 oz), SpO2 100 %.  PHYSICAL EXAMINATION:   Physical Exam  GENERAL:  66 y.o.-year-old patient lying in bed in no acute distress.  EYES: Pupils equal, round, reactive to light and accommodation. No scleral icterus. Extraocular muscles intact.  HEENT: Head atraumatic, normocephalic. Oropharynx and nasopharynx clear.  NECK:  Supple, no jugular venous distention. No thyroid enlargement, no tenderness.  LUNGS: Poor Resp. effort, no wheezing, rales, minimal rhonchi @ bases. No use of accessory muscles of respiration.  CARDIOVASCULAR: S1, S2 normal. No murmurs, rubs, or gallops.  ABDOMEN: Soft, nontender, nondistended. Bowel sounds present. No organomegaly or mass.  EXTREMITIES: No  cyanosis, clubbing or edema b/l.    NEUROLOGIC: Cranial nerves II through XII are intact. No focal Motor or sensory deficits b/l.  Globally weak PSYCHIATRIC: The patient is alert and oriented x 2.  SKIN: No obvious rash, lesion, or ulcer.    LABORATORY PANEL:   CBC  Recent Labs Lab 09/08/17 0510  WBC 14.3*  HGB 9.7*  HCT 30.0*  PLT 461*   ------------------------------------------------------------------------------------------------------------------  Chemistries   Recent Labs Lab 09/06/17 1936  09/09/17 0426  NA 147*  < > 142  K 3.3*  < > 3.6  CL 100*  < > 102  CO2 36*  < > 32  GLUCOSE 116*  < > 109*  BUN 59*  < > 35*  CREATININE 1.70*  < > 1.21*  CALCIUM 8.7*  < > 8.4*  MG 2.2  --   --   < > = values in this interval not displayed. ------------------------------------------------------------------------------------------------------------------  Cardiac Enzymes No results for input(s): TROPONINI in the last 168 hours. ------------------------------------------------------------------------------------------------------------------  RADIOLOGY:  No results found.   ASSESSMENT AND PLAN:   66 year old female with past medical history of hypertension, depression, anxiety, osteoarthritis who presented to the hospital due to shortness of breath and noted to be in acute respiratory failure secondary to pneumonia.  1. Acute respiratory failure with hypoxia-secondary to multifocal pneumonia/CHF/Pulm edema. Initially patient was intubated but now extubated and doing well. - patient was on broad-spectrum IV antibiotics but now have been discontinued as pneumonia has been ruled out. Also given IV diuresis and responded to it and her hypoxia has improved and she is extubated. - resolved.  2.sepsis-initially thought to be secondary to pneumonia. Now has been ruled out. Patient off antibiotics. Lactic acid has normalized.  3. Hypernatremia-continue D5W and would like to  d/c IV fluids once PO intake improves.  - sodium level normalized now.  4. Hypokalemia- resolved w/ supplementation.   5. Altered mental status-etiology unclear but suspected to be metabolic encephalopathy. CT scan, MRI of the brain  negative for acute neurologic process.  - appreciate Neuro input and likely metabolic in nature and improving.  Close to baseline now.   6. Depression-continue Abilify, Prozac.  7. Nutrition - pt. PO intake continues to be very poor. We'll have speech therapy advance her diet if tolerated. Discussed with patient that if her by mouth intake does not improve would consider tube feedings via [place] for NG tube temporarily.   All the records are reviewed and case discussed with Care Management/Social Worker. Management plans discussed with the patient, family and they are in agreement.  CODE STATUS: Full code  DVT Prophylaxis: Lovenox  TOTAL TIME TAKING CARE OF THIS PATIENT: 30 minutes.   POSSIBLE D/C IN 1-2 DAYS, DEPENDING ON CLINICAL CONDITION.   Henreitta Leber M.D on 09/10/2017 at 1:00 PM  Between 7am to 6pm - Pager - [phone]  After 6pm go to [email or web address] - Proofreader  [facility]  Office  [phone]  CC: Primary care physician; Javier Docker, MD

## 2017-09-10 NOTE — Progress Notes (Signed)
Physical Therapy Treatment Patient Details Name: Morgan [name] MRN: [id number] DOB: 03-09-1951 Today's Date: 09/10/2017    History of Present Illness 66 yo female with admission 2 weeks ago for pulm effusion/infiltrates with CHF and suspected PNA leading to sepsis and AMS, intubated finally and just extubated in last 24 hours. (-) brain MRI.  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  To edge of bed on right with mod a x 1 to roll and max a x 1 to lift trunk off bed.  Once sitting she was able to remain with min guard.  She stated she needed to use bathroom upon sitting.  Returned to supine with mod a x 1 as commode was on other side of bed and assistance was needed.  Sat on left side of bed with same assist.  She was able to stand with walker and mod a x 2 and turn to commode at bedside.  Knees did buckle during transfer but she was guided safely to commode.  After voiding and small loose BM she was able to perform her own self care.  Returned to bed with same assist and no buckling noted but generally weak.  Remained sitting edge of bed for a few minutes before requesting to return to supine due to fatigue.  O2 remained 97% during session P 83.   Pt continues with overall poor activity tolerance, general weakness and decreased standing balance.  Gait not appropriate at this time for safety.  SNF remains appropriate.   Follow Up Recommendations  SNF     Equipment Recommendations  None recommended by PT    Recommendations for Other Services       Precautions / Restrictions Precautions Precautions: Fall Restrictions Weight Bearing Restrictions: No    Mobility  Bed Mobility Overal bed mobility: Needs Assistance Bed Mobility: Supine to Sit;Sit to Supine     Supine to sit: Mod assist Sit to supine: Mod assist   General bed mobility comments: unable to push up from sidelying or get legs on/off of  bed  Transfers Overall transfer level: Needs assistance Equipment used: Rolling walker (2 wheeled) Transfers: Sit to/from Stand Sit to Stand: +2 physical assistance;+2 safety/equipment;Min assist;Mod assist         General transfer comment: knees buckle during transfer   Ambulation/Gait Ambulation/Gait assistance: Mod assist;+2 physical assistance;+2 safety/equipment Ambulation Distance (Feet): 2 Feet Assistive device: Rolling walker (2 wheeled) Gait Pattern/deviations: Step-to pattern;Decreased step length - right;Decreased step length - left   Gait velocity interpretation: <1.8 ft/sec, indicative of risk for recurrent falls General Gait Details: to commode at bedside   Stairs            Wheelchair Mobility    Modified Rankin (Stroke Patients Only)       Balance Overall balance assessment: Needs assistance Sitting-balance support: Feet supported;Bilateral upper extremity supported Sitting balance-Leahy Scale: Fair Sitting balance - Comments: able to maintain balance but often rests forearms on knees due to fatigue   Standing balance support: Bilateral upper extremity supported Standing balance-Leahy Scale: Poor Standing balance comment: knees buckle                            Cognition Arousal/Alertness: Awake/alert Behavior During Therapy: WFL for tasks assessed/performed Overall Cognitive Status: Within Functional Limits for tasks assessed  Exercises Other Exercises Other Exercises: focused on functional bed mobility skills left and right and transfers to commode Other Exercises: BLE AAROM ankle pumps, heels slides,  SLR, ab/add x 10 in supine    General Comments        Pertinent Vitals/Pain Pain Assessment: 0-10 Pain Score: 4  Pain Location: back Pain Descriptors / Indicators: Aching Pain Intervention(s): Monitored during session    Home Living                       Prior Function            PT Goals (current goals can now be found in the care plan section) Progress towards PT goals: Progressing toward goals    Frequency    Min 2X/week      PT Plan Current plan remains appropriate    Co-evaluation              AM-PAC PT "6 Clicks" Daily Activity  Outcome Measure  Difficulty turning over in bed (including adjusting bedclothes, sheets and blankets)?: Unable Difficulty moving from lying on back to sitting on the side of the bed? : Unable Difficulty sitting down on and standing up from a chair with arms (e.g., wheelchair, bedside commode, etc,.)?: Unable Help needed moving to and from a bed to chair (including a wheelchair)?: Total Help needed walking in hospital room?: Total Help needed climbing 3-5 steps with a railing? : Total 6 Click Score: 6    End of Session Equipment Utilized During Treatment: Gait belt;Oxygen Activity Tolerance: Patient limited by fatigue Patient left: in bed;with call bell/phone within reach;with bed alarm set Nurse Communication: Mobility status Hemiplegia - Right/Left: Left Hemiplegia - dominant/non-dominant: Non-dominant     Time: 1165-7903 PT Time Calculation (min) (ACUTE ONLY): 27 min  Charges:  $Therapeutic Exercise: 8-22 mins $Therapeutic Activity: 8-22 mins                    G Codes:       Chesley Noon, PTA 09/10/17, 12:06 PM

## 2017-09-10 NOTE — Progress Notes (Signed)
  Speech Language Pathology Treatment: Dysphagia  Patient Details Name: Morgan [name] MRN: [id number] DOB: [birth year] Today's Date: 09/10/2017 Time: 1345-1415 SLP Time Calculation (min) (ACUTE ONLY): 30 min  Assessment / Plan / Recommendation Clinical Impression  Pt was seen for ongoing assessment of po trials in order to upgrade liquid consistency to thin liquids and Dysphagia diet to a level 3; Pt has been tolerating Dysphagia 2 w/ Nectar thick consistency w/ no immediate overt s/s of aspiration per NSG. Pt has not been eating much at meals per report. Dietician is following. Pt was given 3 trials of ice chips and 4 trials of thin liquid via cup (no straw) - pt would not accept any more trials despite encouragement. Pt exhibited mild coughing 1x when she attempted to talk after swallowing, however, coughing did not increase in frequency or intensity throughout the remainder of the trials (unsure if related to po trials).  Pt appeared to have a timely swallow and adequate oral clearing w/ lingual sweeping. No decline in vocal quality or respiratory status were noted during the session.  Pt was given 4 trials soft solids (graham cracker w/ ice cream to moisten). Pt appeared to have adequate mastication and bolus management w/ timely swallow function and oral clearing. No immediate overt s/s of aspiration occurred during po trials of soft solids. No decline in vocal quality or respiratory status were noted during the session.  Pt was educated on general aspiration precautions - small sips/bites, eating slowly, no straws, and minimizing distractions/talking during meals.  Recommend upgrade to Dysphagia 3 w/ thin liquids via cup (NO STRAWS); general aspiration precautions.  ST services will f/u w/ diet toleration, and pt education.    HPI HPI: Pt is a 66 yo female with admission 2 weeks ago for pulm effusion/infiltrates with CHF and suspected PNA leading to sepsis and AMS, intubated finally and just  extubated in last 24 hours. (-) brain MRI.  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      SLP Plan  Continue with current plan of care       Recommendations  Diet recommendations: Dysphagia 3 (mechanical soft);Thin liquid Liquids provided via: Cup;No straw Medication Administration: Whole meds with liquid (1 pill at a time; puree if needed) Supervision: Patient able to self feed;Intermittent supervision to cue for compensatory strategies Compensations: Minimize environmental distractions;Slow rate;Small sips/bites;Lingual sweep for clearance of pocketing;Follow solids with liquid Postural Changes and/or Swallow Maneuvers: Seated upright 90 degrees;Upright 30-60 min after meal                General recommendations:  (dietician following) Oral Care Recommendations: Oral care BID;Patient independent with oral care Follow up Recommendations: None SLP Visit Diagnosis: Dysphagia, oropharyngeal phase (R13.12) Plan: Continue with current plan of care       GO               Carolynn Sayers, SLP-Graduate Student Carolynn Sayers 09/10/2017, 2:28 PM  This information has been reviewed and agreed upon by this supervising clinician.  This patient note, response to treatment and overall treatment plan has been reviewed and this clinician agrees with the information provided.  09/10/17, 2:41 PM [place], [employer], CCC-SLP

## 2017-09-10 NOTE — Clinical Social Work Note (Signed)
Patient is a manual screen for [facility], CSW faxed requested clinical information, awaiting for [facility] number and bed offers.  Jones Broom. [place], MSW, [place]  09/10/2017 10:05 AM

## 2017-09-10 NOTE — Clinical Social Work Note (Addendum)
CSW presented bed offers to patient.  Patient is requesting to go to [facility].  CSW spoke to [facility] and they will review patient's information and contact CSW back if they can offer.  CSW to continue to follow patient's progress throughout discharge planning.  CSW received [facility] number back on patient, [facility] number is [phone].  4:45pm  CSW received phone call from [facility] they can accept patient and will begin insurance authorization.     Jones Broom. [place], MSW, [place]  09/10/2017 2:49 PM

## 2017-09-11 LAB — BASIC METABOLIC PANEL
Anion gap: 9 (ref 5–15)
BUN: 20 mg/dL (ref 6–20)
CHLORIDE: 99 mmol/L — AB (ref 101–111)
CO2: 30 mmol/L (ref 22–32)
CREATININE: 1.3 mg/dL — AB (ref 0.44–1.00)
Calcium: 8.4 mg/dL — ABNORMAL LOW (ref 8.9–10.3)
GFR calc Af Amer: 49 mL/min — ABNORMAL LOW (ref >60)
GFR calc non Af Amer: 42 mL/min — ABNORMAL LOW (ref >60)
Glucose, Bld: 107 mg/dL — ABNORMAL HIGH (ref 65–99)
Potassium: 3.2 mmol/L — ABNORMAL LOW (ref 3.5–5.1)
SODIUM: 138 mmol/L (ref 135–145)

## 2017-09-11 LAB — GLUCOSE, CAPILLARY
GLUCOSE-CAPILLARY: 93 mg/dL (ref 65–99)
GLUCOSE-CAPILLARY: 109 mg/dL — AB (ref 65–99)
GLUCOSE-CAPILLARY: 100 mg/dL — AB (ref 65–99)
Glucose-Capillary: 102 mg/dL — ABNORMAL HIGH (ref 65–99)

## 2017-09-11 MED ORDER — ENSURE ENLIVE PO LIQD
237.0000 mL | Freq: Three times a day (TID) | ORAL | Status: DC
  Administered 2017-09-11 – 2017-09-12 (×3): 237 mL via ORAL

## 2017-09-11 MED ORDER — POTASSIUM CHLORIDE CRYS ER 20 MEQ PO TBCR
40.0000 meq | EXTENDED_RELEASE_TABLET | Freq: Once | ORAL | Status: AC
  Administered 2017-09-11: 14:00:00 40 meq via ORAL
  Filled 2017-09-11: qty 2

## 2017-09-11 NOTE — Evaluation (Signed)
Occupational Therapy Evaluation Patient Details Name: VERNECIA UMBLE MRN: [id number] DOB: 01-06-1951 Today's Date: 09/11/2017    History of Present Illness 66 yo female with admission 2 weeks ago for pulm effusion/infiltrates with CHF and suspected PNA leading to sepsis and AMS, intubated 9/13, extubated 9/19. (-) brain MRI.  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   Clinical Impression   Pt seen for OT evaluation this date. Prior to hospital admission, pt was independent with all ADL/IADL, mobility, driving, and working.  Pt lives with a roommate and roommate's husband in a single family home with 5 steps to enter with bilateral hand rails and a walk in shower. Pt reports she has access to a shower chair and grab bar in shower.  Currently pt is min-mod assist for LB ADL tasks and min assist for bed mobility due to impaired activity tolerance, decreased strength, and impaired balance. Pt alert and oriented to self, place, generally to situation, and disoriented to date (pt believed it was Saturday in August, actually Wednesday in Sept). Pt able to follow all commands consistently throughout session. VSS throughout session and 1L O2 via nasal cannula. Pt noted mild lightheadedness upon initial sit to stand that resolved quickly once seated again. On second attempt, pt able to perform sit to stand and stand pivot transfers with RW and mod assist to recliner with verbal cues for hand placement. Pt noting it "feels good to be up." Pt would benefit from skilled OT services to address noted impairments and functional limitations (see below for any additional details).  Upon hospital discharge, recommend pt discharge to STR in order to maximize return to PLOF and minimize risk of falls.    Follow Up Recommendations  SNF    Equipment Recommendations  3 in 1 bedside commode    Recommendations for Other Services       Precautions / Restrictions Precautions Precautions:  Fall Restrictions Weight Bearing Restrictions: No      Mobility Bed Mobility Overal bed mobility: Needs Assistance Bed Mobility: Supine to Sit     Supine to sit: Min assist;HOB elevated     General bed mobility comments: additional time/effort, heavy use of bed rails, and min assist to support trunk to complete supine to sit EOB, min guard while pt scooted closer EOB so feet were flat on the floor.  Transfers Overall transfer level: Needs assistance Equipment used: Rolling walker (2 wheeled) Transfers: Sit to/from [facility] Sit to Stand: Mod assist Stand pivot transfers: Mod assist       General transfer comment: mod assist and VC for hand placement, initially became slightly lightheaded upon initial stand and carefully sat back down. On second attempt, pt reported no dizziness/lightheadedness.    Balance Overall balance assessment: Needs assistance Sitting-balance support: Feet supported;Bilateral upper extremity supported Sitting balance-Leahy Scale: Fair Sitting balance - Comments: able to maintain balance with BUE support, occasional min guard while pt adjusted her socks    Standing balance support: Bilateral upper extremity supported Standing balance-Leahy Scale: Poor                             ADL either performed or assessed with clinical judgement   ADL Overall ADL's : Needs assistance/impaired Eating/Feeding: Sitting;Set up   Grooming: Sitting;Set up   Upper Body Bathing: Sitting;Set up;Min guard   Lower Body Bathing: Sitting/lateral leans;Minimal assistance;Moderate assistance   Upper Body Dressing : Sitting;Set up;Supervision/safety   Lower Body  Dressing: Sitting/lateral leans;Sit to/from stand;Minimal assistance;Moderate assistance   Toilet Transfer: Moderate assistance;BSC;Stand-pivot;Minimal assistance;RW Armed forces technical officer Details (indicate cue type and reason): VC for hand placement to maximize safety Toileting-  Clothing Manipulation and Hygiene: Min guard;Set up;Sitting/lateral lean         General ADL Comments: pt generally min-mod assist for LB ADL tasks due to very poor activity tolerance     Vision Baseline Vision/History: Wears glasses Wears Glasses: Reading only Patient Visual Report: No change from baseline Vision Assessment?: No apparent visual deficits     Perception     Praxis      Pertinent Vitals/Pain Pain Assessment: No/denies pain     Hand Dominance     Extremity/Trunk Assessment Upper Extremity Assessment Upper Extremity Assessment: Overall WFL for tasks assessed (grossly 4/5 bilaterally)   Lower Extremity Assessment Lower Extremity Assessment: Generalized weakness (grossly 4-/5 bilaterally)   Cervical / Trunk Assessment Cervical / Trunk Assessment: Normal   Communication Communication Communication: No difficulties   Cognition Arousal/Alertness: Awake/alert Behavior During Therapy: WFL for tasks assessed/performed Overall Cognitive Status: Within Functional Limits for tasks assessed                                 General Comments: Pt alert and oriented to self, place, and somewhat to situation, disoriented to date; able to follow all commands consistently   General Comments       Exercises Other Exercises Other Exercises: functional mobility training and falls prevention education.    Shoulder Instructions      Home Living Family/patient expects to be discharged to:: Skilled nursing facility Living Arrangements: Non-relatives/Friends (pt reports living with roommate and roommate's husband)                               Additional Comments: single family house, 5 steps to enter with bilat hand rails, walk-in shower with grab bar, regular height toilet      Prior Functioning/Environment Level of Independence: Independent        Comments: Pt independent with mobility, ADL, IADL including driving and working at [employer] List: Decreased activity tolerance;Decreased strength;Decreased knowledge of use of DME or AE;Impaired balance (sitting and/or standing)      OT Treatment/Interventions: Self-care/ADL training;Therapeutic exercise;Therapeutic activities;DME and/or AE instruction;Patient/family education;Energy conservation    OT Goals(Current goals can be found in the care plan section) Acute Rehab OT Goals Patient Stated Goal: get stronger and return to PLOF OT Goal Formulation: With patient Time For Goal Achievement: 09/25/17 Potential to Achieve Goals: Good  OT Frequency: Min 2X/week   Barriers to D/C: Inaccessible home environment;Decreased caregiver support          Co-evaluation              AM-PAC PT "6 Clicks" Daily Activity     Outcome Measure Help from another person eating meals?: A Little Help from another person taking care of personal grooming?: A Little Help from another person toileting, which includes using toliet, bedpan, or urinal?: A Lot Help from another person bathing (including washing, rinsing, drying)?: A Lot Help from another person to put on and taking off regular upper body clothing?: A Little Help from another person to put on and taking off regular lower body clothing?: A Lot 6 Click Score: 15   End of Session Equipment  Utilized During Treatment: Gait belt;Rolling walker  Activity Tolerance: Patient tolerated treatment well Patient left: in chair;with call bell/phone within reach;with chair alarm set  OT Visit Diagnosis: Other abnormalities of gait and mobility (R26.89);Muscle weakness (generalized) (M62.81)                Time: 4709-6283 OT Time Calculation (min): 24 min Charges:  OT General Charges $OT Visit: 1 Visit OT Evaluation $OT Eval Low Complexity: 1 Low OT Treatments $Self Care/Home Management : 8-22 mins G-Codes: OT G-codes **NOT FOR INPATIENT CLASS** Functional Assessment Tool Used: AM-PAC 6 Clicks Daily  Activity;Clinical judgement Functional Limitation: Self care Self Care Current Status (M6294): At least 40 percent but less than 60 percent impaired, limited or restricted Self Care Goal Status (T6546): At least 20 percent but less than 40 percent impaired, limited or restricted   Jeni Salles, MPH, MS, OTR/L ascom [phone] 09/11/17, 9:23 AM

## 2017-09-11 NOTE — Progress Notes (Signed)
Nutrition Follow-up  DOCUMENTATION CODES:   Non-severe (moderate) malnutrition in context of acute illness/injury  INTERVENTION:  As patient's appetite is steadily improving now, likely will be able to meet her needs with meals and oral nutrition supplements.  Discontinued Mighty Shake II.  Continue Magic cup TID with meals, each supplement provides 290 kcal and 9 grams of protein.  Provide Ensure Enlive po TID between meals, each supplement provides 350 kcal and 20 grams of protein.  NUTRITION DIAGNOSIS:   Malnutrition (Moderate) related to acute illness (AMS following extubation, inadequate oral intake) as evidenced by percent weight loss, energy intake < or equal to 50% for > or equal to 5 days.  Ongoing - PO intake approaching 50% of needs. Will also add Ensure.  GOAL:   Patient will meet greater than or equal to 90% of their needs  Progressing.  MONITOR:   PO intake, Supplement acceptance, Diet advancement, Labs, Weight trends, I & O's  REASON FOR ASSESSMENT:   Consult Enteral/tube feeding initiation and management  ASSESSMENT:   66 year old female with PMHx of anxiety, depression, HTN, OA who presented with SOB and found to have acute respiratory failure with hypoxia. Patient was intubated 9/13-9/19. Patient has had AMS following extubation thought to be metabolic in nature.  -Following SLP evaluation on 9/25 patient was advanced to dysphagia 3 diet with thin liquids. SLP also approved patient to have cheeseburgers that are not chopped.   Met with patient at bedside. She was sitting up in chair and appeared much more alert than previous assessment. She was still somewhat confused. Patient reports her appetite is coming back finally. She still only had 0% of breakfast yesterday, but per chart she had 25% of both lunch and dinner yesterday. Patient reports she enjoyed the burger and macaroni and cheese. She reports that for breakfast this morning she ate her eggs, a  biscuit with jelly, and some of her Magic Cup. She did not like the Mighty Shake II on tray.   Meal Completion: 25 over the past 24 hrs - improvement In the past 24 hrs patient has had approximately 940 kcal (49% minimum estimated kcal needs) and 42 grams of protein (47% minimum estimated protein needs).  Medications reviewed and include: Novolog 0-9 units TID, Novolog 0-5 units QHS, D5W with KCl 20 mEq/L @ 50 ml/hr (60 grams dextrose, 204 kcal daily).  Labs reviewed: CBG 93-116 past 24 hrs, Potassium 3.2, Chloride 99, Creatinine 1.3.  Weight trend: wt continues to trend down; 86.8 kg on 9/26  Discussed with RN.  Diet Order:  DIET DYS 3 Room service appropriate? Yes with Assist; Fluid consistency: Thin  Skin:  Wound (see comment) (MSAD to bilateral groin/thighs, skin tear to left arm)  Last BM:  09/11/2017 - medium type 7  Height:   Ht Readings from Last 1 Encounters:  08/26/17 6' 1"  (1.854 m)    Weight:   Wt Readings from Last 1 Encounters:  09/11/17 191 lb 7 oz (86.8 kg)    Ideal Body Weight:  75 kg  BMI:  Body mass index is 25.26 kg/m.  Estimated Nutritional Needs:   Kcal:  1900-2200 (MSJ x 1.2-1.4)  Protein:  90-110 grams (1-1.2 grams/kg)  Fluid:  2.2 L/day (25 ml/kg)  EDUCATION NEEDS:   Education needs no appropriate at this time  Willey Blade, MS, RD, LDN Pager: [phone] After Hours Pager: [phone]

## 2017-09-11 NOTE — Progress Notes (Signed)
PT Cancellation Note  Patient Details Name: Morgan [name] MRN: [id number] DOB: Jun 06, 1951   Cancelled Treatment:    Reason Eval/Treat Not Completed: Patient declined, no reason specified. Treatment attempted; pt sleeping and awoken through voice and touch. Pt refuses therapy at this time noting she is tired, very cold and is not feeling well. With gentle encouragement, "please, I can't do it now" Re attempt at a later time/date as the schedule allows.    Larae Grooms, PTA 09/11/2017, 12:32 PM

## 2017-09-11 NOTE — Plan of Care (Signed)
Patient has had much better po intake today, family bringing in outside food.  Patient up in chair for most of the day.  Clarise Cruz, RN

## 2017-09-11 NOTE — Progress Notes (Signed)
[facility] at [place] NAME: Morgan [name]    MR#:  [id number]  DATE OF BIRTH:  [birth year]  SUBJECTIVE:   Patient's by mouth intake has significantly improved, she is not on a dysphagia 3 diet and tolerating it well. No other acute events or complaints presently. Awaiting insurance authorization for placement to short-term rehabilitation.  REVIEW OF SYSTEMS:    Review of Systems  Constitutional: Negative for chills and fever.  HENT: Negative for congestion and tinnitus.   Eyes: Negative for blurred vision and double vision.  Respiratory: Negative for cough, shortness of breath and wheezing.   Cardiovascular: Negative for chest pain, orthopnea and PND.  Gastrointestinal: Negative for abdominal pain, diarrhea, nausea and vomiting.  Genitourinary: Negative for dysuria and hematuria.  Neurological: Positive for weakness. Negative for dizziness, sensory change and focal weakness.  All other systems reviewed and are negative.   Nutrition: Dysphagia III with thin liquids Tolerating Diet: Yes  Tolerating PT: Eval noted.  DRUG ALLERGIES:   Allergies  Allergen Reactions  . Demerol [Meperidine]     Hives   . Strawberry (Diagnostic) Hives    VITALS:  Blood pressure 125/66, pulse 80, temperature 98.5 F (36.9 C), temperature source Oral, resp. rate 18, height 6\' 1"  (1.854 m), weight 86.8 kg (191 lb 7 oz), SpO2 100 %.  PHYSICAL EXAMINATION:   Physical Exam  GENERAL:  66 y.o.-year-old patient sitting up in chair in no acute distress.  EYES: Pupils equal, round, reactive to light and accommodation. No scleral icterus. Extraocular muscles intact.  HEENT: Head atraumatic, normocephalic. Oropharynx and nasopharynx clear.  NECK:  Supple, no jugular venous distention. No thyroid enlargement, no tenderness.  LUNGS: Poor Resp. effort, no wheezing, rales, rhonchi. No use of accessory muscles of respiration.  CARDIOVASCULAR: S1, S2 normal. No murmurs,  rubs, or gallops.  ABDOMEN: Soft, nontender, nondistended. Bowel sounds present. No organomegaly or mass.  EXTREMITIES: No cyanosis, clubbing or edema b/l.    NEUROLOGIC: Cranial nerves II through XII are intact. No focal Motor or sensory deficits b/l.  Globally weak PSYCHIATRIC: The patient is alert and oriented x 3.  SKIN: No obvious rash, lesion, or ulcer.    LABORATORY PANEL:   CBC  Recent Labs Lab 09/08/17 0510  WBC 14.3*  HGB 9.7*  HCT 30.0*  PLT 461*   ------------------------------------------------------------------------------------------------------------------  Chemistries   Recent Labs Lab 09/06/17 1936  09/11/17 0550  NA 147*  < > 138  K 3.3*  < > 3.2*  CL 100*  < > 99*  CO2 36*  < > 30  GLUCOSE 116*  < > 107*  BUN 59*  < > 20  CREATININE 1.70*  < > 1.30*  CALCIUM 8.7*  < > 8.4*  MG 2.2  --   --   < > = values in this interval not displayed. ------------------------------------------------------------------------------------------------------------------  Cardiac Enzymes No results for input(s): TROPONINI in the last 168 hours. ------------------------------------------------------------------------------------------------------------------  RADIOLOGY:  No results found.   ASSESSMENT AND PLAN:   66 year old female with past medical history of hypertension, depression, anxiety, osteoarthritis who presented to the hospital due to shortness of breath and noted to be in acute respiratory failure secondary to pneumonia.  1. Acute respiratory failure with hypoxia-secondary to multifocal pneumonia/CHF/Pulm edema. Initially patient was intubated but now extubated and doing well. - patient was on broad-spectrum IV antibiotics but now have been discontinued as pneumonia has been ruled out. Also given IV diuresis and responded to it and  her hypoxia has improved and she is extubated. - no acute events overnight and resp. Status stable.   2.sepsis-initially  thought to be secondary to pneumonia. Now has been ruled out. Patient off antibiotics. Lactic acid has normalized.  3. Hypernatremia- sodium normalized now. Taking Po well now.  Will d/c D5W and follow sodium.    4. Hypokalemia- will supplement and repeat in a.m.   5. Altered mental status-etiology unclear but suspected to be metabolic encephalopathy. CT scan, MRI of the brain  negative for acute neurologic process.  - appreciate Neuro input and likely metabolic in nature and improving.  - mental status back to baseline now.   6. Depression-continue Abilify, Prozac.  7. Nutrition - PO intake much improved now.  Taking dysphagia III diet with thin liquids and doing well.   Awaiting Insurance authorization for placement to skilled nursing facility.  All the records are reviewed and case discussed with Care Management/Social Worker. Management plans discussed with the patient, family and they are in agreement.  CODE STATUS: Full code  DVT Prophylaxis: Lovenox  TOTAL TIME TAKING CARE OF THIS PATIENT: 30 minutes.   POSSIBLE D/C IN 1-2 DAYS, DEPENDING ON CLINICAL CONDITION.   Henreitta Leber M.D on 09/11/2017 at 1:36 PM  Between 7am to 6pm - Pager - [phone]  After 6pm go to [email or web address] - Proofreader  [facility]  Office  [phone]  CC: Primary care physician; Javier Docker, MD

## 2017-09-12 LAB — GLUCOSE, CAPILLARY
GLUCOSE-CAPILLARY: 110 mg/dL — AB (ref 65–99)
Glucose-Capillary: 103 mg/dL — ABNORMAL HIGH (ref 65–99)

## 2017-09-12 LAB — POTASSIUM: POTASSIUM: 3.1 mmol/L — AB (ref 3.5–5.1)

## 2017-09-12 MED ORDER — GABAPENTIN 300 MG PO CAPS: 300.0000 mg | ORAL_CAPSULE | Freq: Three times a day (TID) | ORAL | Status: DC | PRN

## 2017-09-12 MED ORDER — HYDROXYZINE PAMOATE 25 MG PO CAPS: 25.0000 mg | ORAL_CAPSULE | Freq: Three times a day (TID) | ORAL | 0 refills | Status: DC | PRN

## 2017-09-12 MED ORDER — ENSURE ENLIVE PO LIQD: 237.0000 mL | Freq: Three times a day (TID) | ORAL | 12 refills | Status: DC

## 2017-09-12 MED ORDER — POTASSIUM CHLORIDE CRYS ER 20 MEQ PO TBCR
40.0000 meq | EXTENDED_RELEASE_TABLET | ORAL | Status: AC
  Administered 2017-09-12 (×2): 40 meq via ORAL
  Filled 2017-09-12 (×2): qty 2

## 2017-09-12 MED ORDER — FUROSEMIDE 40 MG PO TABS
40.0000 mg | ORAL_TABLET | Freq: Every day | ORAL | Status: DC
  Administered 2017-09-12: 40 mg via ORAL
  Filled 2017-09-12: qty 1

## 2017-09-12 NOTE — Clinical Social Work Note (Signed)
CSW received phone call that patient has been approved for SNF placement.    Patient to be d/c'ed today to [facility].  Patient and family agreeable to plans will transport via ems RN to call report to [facility], room 1206.  Evette Cristal, MSW, [place]

## 2017-09-12 NOTE — Progress Notes (Signed)
Report given to Nicole Kindred at [facility]. Pt going to Rm 1206 [phone]). Pt dressed and ready for transfer. PICC removed. Will call EMS for transportation.

## 2017-09-12 NOTE — Progress Notes (Signed)
OT Cancellation Note  Patient Details Name: Morgan [name] MRN: [id number] DOB: 03-17-1951   Cancelled Treatment:    Reason Eval/Treat Not Completed: Other (comment). Upon attempt to treat this afternoon, pt preparing for discharge to STR. Will hold OT for now as pt unavailable.  Jeni Salles, MPH, MS, OTR/L ascom [phone] 09/12/17, 3:22 PM

## 2017-09-12 NOTE — Progress Notes (Signed)
Physical Therapy Treatment Patient Details Name: Morgan [name] MRN: [id number] DOB: 1951/06/17 Today's Date: 09/12/2017    History of Present Illness 66 yo female with admission 2 weeks ago for pulm effusion/infiltrates with CHF and suspected PNA leading to sepsis and AMS, intubated 9/13, extubated 9/19. (-) brain MRI.  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  Pt was able to stand and ambulate 8' x 2 with min a x 1 and close recliner follow for safety as her LE's remain generally weak.  No buckling noted today.  Pt very pleased with her ability to ambulate today.   Follow Up Recommendations  SNF     Equipment Recommendations  None recommended by PT    Recommendations for Other Services       Precautions / Restrictions Precautions Precautions: Fall Restrictions Weight Bearing Restrictions: No    Mobility  Bed Mobility               General bed mobility comments: in recliner upon arrival  Transfers Overall transfer level: Needs assistance Equipment used: Rolling walker (2 wheeled) Transfers: Sit to/from Stand Sit to Stand: Min assist         General transfer comment: no dizziness noted  Ambulation/Gait Ambulation/Gait assistance: Min assist;+2 safety/equipment Ambulation Distance (Feet): 8 Feet Assistive device: Rolling walker (2 wheeled)     Gait velocity interpretation: <1.8 ft/sec, indicative of risk for recurrent falls General Gait Details: 8' x 2 with recliner follow as she fatigues quickly   Stairs            Wheelchair Mobility    Modified Rankin (Stroke Patients Only)       Balance Overall balance assessment: Needs assistance Sitting-balance support: Feet supported;Bilateral upper extremity supported Sitting balance-Leahy Scale: Fair     Standing balance support: Bilateral upper extremity supported Standing balance-Leahy Scale: Poor                               Cognition Arousal/Alertness: Awake/alert Behavior During Therapy: WFL for tasks assessed/performed Overall Cognitive Status: Within Functional Limits for tasks assessed                                        Exercises Other Exercises Other Exercises: seated AROM ankle pumps, LAQ, marches and ab/add.  marching in place x 10 prior to gait.    General Comments        Pertinent Vitals/Pain Pain Assessment: 0-10 Pain Score: 4  Pain Location: legs Pain Descriptors / Indicators: Aching Pain Intervention(s): Limited activity within patient's tolerance;Monitored during session    Home Living                      Prior Function            PT Goals (current goals can now be found in the care plan section) Progress towards PT goals: Progressing toward goals    Frequency    Min 2X/week      PT Plan      Co-evaluation              AM-PAC PT "6 Clicks" Daily Activity  Outcome Measure  Difficulty turning over in bed (including adjusting bedclothes, sheets and blankets)?: Unable Difficulty moving from lying  on back to sitting on the side of the bed? : Unable Difficulty sitting down on and standing up from a chair with arms (e.g., wheelchair, bedside commode, etc,.)?: Unable Help needed moving to and from a bed to chair (including a wheelchair)?: A Little Help needed walking in hospital room?: A Lot Help needed climbing 3-5 steps with a railing? : Total 6 Click Score: 9    End of Session Equipment Utilized During Treatment: Gait belt Activity Tolerance: Patient tolerated treatment well;Patient limited by fatigue Patient left: in chair;with chair alarm set;with call bell/phone within reach Nurse Communication: Mobility status Hemiplegia - Right/Left: Left Hemiplegia - dominant/non-dominant: Non-dominant     Time: 7062-3762 PT Time Calculation (min) (ACUTE ONLY): 10 min  Charges:  $Gait Training: 8-22 mins                     G Codes:       Chesley Noon, PTA 09/12/17, 9:28 AM

## 2017-09-12 NOTE — Progress Notes (Signed)
Weaned to room air, oxygen saturations 95%. Up to chair for 2 hours with 1 assist. Appetite improving. Pt updated awaiting insurance approval for SNF rehab.

## 2017-09-12 NOTE — Discharge Summary (Signed)
[place] at [place] NAME: Morgan [name]    MR#:  [id number]  DATE OF BIRTH:  [birth year]  DATE OF ADMISSION:  08/26/2017 ADMITTING PHYSICIAN: Lance Coon, MD  DATE OF DISCHARGE: 09/12/2017  PRIMARY CARE PHYSICIAN: [name], [name], MD   ADMISSION DIAGNOSIS:  HCAP (healthcare-associated pneumonia) [J18.9] Multifocal pneumonia [J18.9]  DISCHARGE DIAGNOSIS:  Principal Problem:   Sepsis ([place]) Active Problems:   Essential hypertension   Depression   Anxiety   HCAP (healthcare-associated pneumonia)   Acute respiratory failure ([place])   SECONDARY DIAGNOSIS:   Past Medical History:  Diagnosis Date  . Allergy   . Anemia   . Anxiety   . Arthritis   . Depression   . Hypertension      ADMITTING HISTORY  HISTORY OF PRESENT ILLNESS:  Morgan [name]  is a 66 y.o. female who presents with fever, cough, malaise and myalgias. She states it is been going on for the past 3 days. It is getting worse. Here in the ED she was found to have multifocal pneumonia and meets sepsis criteria. hospitalists were called for admission and further treatment   HOSPITAL COURSE:   66 year old female with past medical history of hypertension, depression, anxiety, osteoarthritis who presented to the hospital due to shortness of breath and noted to be in acute respiratory failure secondary to pneumonia.  * Acute respiratory failure with hypoxia secondary to bilateral pneumonia and congestive heart failure  Finished antibiotics. Today on room air.  *  Acute on chronic diastolic congestive heart falure. Diursed with IV Lasix. Good response. Now resolved. No signs of fluid overload  *  Sepsis present on admission due to bilateral pneumonia has resolved  * Hypernatremia- sodium normalized now.  * Hypokalemia  Replace orally toda.  *  Acute toxic metabolic  Encephalopathy and ICU delirium  CT scan, MRI of the brain - negative for acute neurologic process.   - appreciate Neuro input - mental status back to baseline now.   * Depression-continue Abilify, Prozac.  * Nutrition - PO intake improving Ensure enlive TID.  Dysphgia 3 diet  *  Acute kidney injury due to sepsis has resolved  * Elevated troponin due to demand ischemia. Echocardiogram similar to echocardiogram from 2017. Mild CAD and catheterization 1 year back. Will need outpatient cardiology follow-up.  Stable for discharge to SNF for PT  CONSULTS OBTAINED:  Treatment Team:  Catarina Hartshorn, MD  DRUG ALLERGIES:   Allergies  Allergen Reactions  . Demerol [Meperidine]     Hives   . Strawberry (Diagnostic) Hives    DISCHARGE MEDICATIONS:   Current Discharge Medication List    START taking these medications   Details  feeding supplement, ENSURE ENLIVE, (ENSURE ENLIVE) LIQD Take 237 mLs by mouth 3 (three) times daily between meals. Qty: 237 mL, Refills: 12      CONTINUE these medications which have CHANGED   Details  gabapentin (NEURONTIN) 300 MG capsule Take 1 capsule (300 mg total) by mouth 3 (three) times daily as needed.    hydrOXYzine (VISTARIL) 25 MG capsule Take 1 capsule (25 mg total) by mouth 3 (three) times daily as needed for anxiety. Take 25 mg by mouth twice daily and take 50 mg by mouth at bedtime as needed Qty: 30 capsule, Refills: 0      CONTINUE these medications which have NOT CHANGED   Details  acetaminophen (TYLENOL) 500 MG tablet Take 1,000 mg by mouth every 6 (six) hours as needed  for moderate pain (pain).     albuterol (PROVENTIL HFA;VENTOLIN HFA) 108 (90 BASE) MCG/ACT inhaler Inhale 2 puffs into the lungs every 4 (four) hours as needed for wheezing or shortness of breath. Qty: 1 Inhaler, Refills: 0    amLODipine (NORVASC) 2.5 MG tablet Take 2.5 mg by mouth daily.    ARIPiprazole (ABILIFY) 15 MG tablet Take 15 mg by mouth daily.     benztropine (COGENTIN) 0.5 MG tablet Take 0.5 mg by mouth 2 (two) times daily.    FLUoxetine  (PROZAC) 20 MG capsule Take 20 mg by mouth daily.    mirtazapine (REMERON) 45 MG tablet Take 45 mg by mouth at bedtime.     pantoprazole (PROTONIX) 40 MG tablet Take 40 mg by mouth daily.    potassium chloride (K-DUR) 10 MEQ tablet Take 10 mEq by mouth daily.      STOP taking these medications     azithromycin (ZITHROMAX) 250 MG tablet         Today   VITAL SIGNS:  Blood pressure 122/65, pulse 68, temperature 98.7 F (37.1 C), temperature source Oral, resp. rate 14, height 6\' 1"  (1.854 m), weight 86.3 kg (190 lb 4.8 oz), SpO2 95 %.  I/O:   Intake/Output Summary (Last 24 hours) at 09/12/17 1307 Last data filed at 09/12/17 0953  Gross per 24 hour  Intake              120 ml  Output                0 ml  Net              120 ml    PHYSICAL EXAMINATION:  Physical Exam  GENERAL:  66 y.o.-year-old patient lying in the bed with no acute distress.  LUNGS: Normal breath sounds bilaterally, no wheezing, rales,rhonchi or crepitation. No use of accessory muscles of respiration.  CARDIOVASCULAR: S1, S2 normal. No murmurs, rubs, or gallops.  ABDOMEN: Soft, non-tender, non-distended. Bowel sounds present. No organomegaly or mass.  NEUROLOGIC: Moves all 4 extremities. PSYCHIATRIC: The patient is alert and awake SKIN: No obvious rash, lesion, or ulcer.   DATA REVIEW:   CBC  Recent Labs Lab 09/08/17 0510  WBC 14.3*  HGB 9.7*  HCT 30.0*  PLT 461*    Chemistries   Recent Labs Lab 09/06/17 1936  09/11/17 0550 09/12/17 0602  NA 147*  < > 138  --   K 3.3*  < > 3.2* 3.1*  CL 100*  < > 99*  --   CO2 36*  < > 30  --   GLUCOSE 116*  < > 107*  --   BUN 59*  < > 20  --   CREATININE 1.70*  < > 1.30*  --   CALCIUM 8.7*  < > 8.4*  --   MG 2.2  --   --   --   < > = values in this interval not displayed.  Cardiac Enzymes No results for input(s): TROPONINI in the last 168 hours.  Microbiology Results  Results for orders placed or performed during the hospital encounter of  08/26/17  C difficile quick scan w PCR reflex     Status: None   Collection Time: 08/26/17  8:37 PM  Result Value Ref Range Status   C Diff antigen NEGATIVE NEGATIVE Final   C Diff toxin NEGATIVE NEGATIVE Final   C Diff interpretation No C. difficile detected.  Final  Culture, blood (Routine X 2) w  Reflex to ID Panel     Status: None   Collection Time: 08/26/17  9:48 PM  Result Value Ref Range Status   Specimen Description BLOOD LEFT HAND  Final   Special Requests   Final    BOTTLES DRAWN AEROBIC AND ANAEROBIC Blood Culture adequate volume   Culture NO GROWTH 5 DAYS  Final   Report Status 08/31/2017 FINAL  Final  Culture, blood (Routine X 2) w Reflex to ID Panel     Status: None   Collection Time: 08/26/17  9:48 PM  Result Value Ref Range Status   Specimen Description BLOOD RAC  Final   Special Requests   Final    BOTTLES DRAWN AEROBIC AND ANAEROBIC Blood Culture results may not be optimal due to an excessive volume of blood received in culture bottles   Culture NO GROWTH 5 DAYS  Final   Report Status 08/31/2017 FINAL  Final  MRSA PCR Screening     Status: None   Collection Time: 08/28/17 11:12 AM  Result Value Ref Range Status   MRSA by PCR NEGATIVE NEGATIVE Final    Comment:        The GeneXpert MRSA Assay (FDA approved for NASAL specimens only), is one component of a comprehensive MRSA colonization surveillance program. It is not intended to diagnose MRSA infection nor to guide or monitor treatment for MRSA infections.   Culture, respiratory (NON-Expectorated)     Status: Abnormal   Collection Time: 08/29/17 10:46 PM  Result Value Ref Range Status   Specimen Description SPUTUM  Final   Special Requests NONE  Final   Gram Stain   Final    RARE WBC PRESENT,BOTH PMN AND MONONUCLEAR NO SQUAMOUS EPITHELIAL CELLS SEEN NO ORGANISMS SEEN Performed at [facility], [address] [place], [place] [address]    Culture MULTIPLE ORGANISMS PRESENT, NONE PREDOMINANT (A)   Final   Report Status 09/01/2017 FINAL  Final    RADIOLOGY:  No results found.  Follow up with PCP in 1 week.  Management plans discussed with the patient, family and they are in agreement.  CODE STATUS:     Code Status Orders        Start     Ordered   08/26/17 2213  Full code  Continuous     08/26/17 2212    Code Status History    Date Active Date Inactive Code Status Order ID Comments User Context   02/23/2015 12:58 AM 02/24/2015 12:53 PM Full Code [id number]  Ivor Costa, MD Inpatient      TOTAL TIME TAKING CARE OF THIS PATIENT ON DAY OF DISCHARGE: more than 30 minutes.   Hillary Bow R M.D on 09/12/2017 at 1:07 PM  Between 7am to 6pm - Pager - [phone]  After 6pm go to [email or web address] - password [email or web address] [facility]  Office  [phone]  CC: Primary care physician; Javier Docker, MD  Note: This dictation was prepared with Dragon dictation along with smaller phrase technology. Any transcriptional errors that result from this process are unintentional.

## 2017-09-12 NOTE — Progress Notes (Signed)
Speech Therapy Note: Reviewed chart notes. Consulted NSG then pt.  Pt denied any difficulty swallowing and is currently on a Dysphagia level 3 diet(cut meats) w/ Thin liquids; tolerates swallowing pills w/ thin liquid per NSG. Pt denied any further concerns when asked by ST student and has done well w/ meals per NSG. ST student reinforced aspiration precautions; education.  No further skilled ST services indicated as pt appears at her baseline. Pt agreed. NSG to reconsult if any change in status.    Carolynn Sayers, SLP-Graduate Student Carolynn Sayers 09/12/17, 13:41    This information has been reviewed and agreed upon by this supervising clinician.  This patient note, response to treatment and overall treatment plan has been reviewed and this clinician agrees with the information provided.  09/12/17, 2:06 PM [place], [place], CCC-SLP

## 2017-09-12 NOTE — Discharge Instructions (Signed)
Dysphagia 3 diet  Activity with assistance

## 2017-09-12 NOTE — Progress Notes (Signed)
[place] at [place] NAME: Morgan [name]    MR#:  [id number]  DATE OF BIRTH:  1951-12-10  SUBJECTIVE:    patient admitted for pneumonia/CHF and intubated. Later extubated and transferred 2 floor. Today she is on room air.  alert and awake. ambulated with physical therapy. REVIEW OF SYSTEMS:    Review of Systems  Constitutional: Negative for chills and fever.  HENT: Negative for congestion and tinnitus.   Eyes: Negative for blurred vision and double vision.  Respiratory: Negative for cough, shortness of breath and wheezing.   Cardiovascular: Negative for chest pain, orthopnea and PND.  Gastrointestinal: Negative for abdominal pain, diarrhea, nausea and vomiting.  Genitourinary: Negative for dysuria and hematuria.  Neurological: Positive for weakness. Negative for dizziness, sensory change and focal weakness.  All other systems reviewed and are negative.   Nutrition: Dysphagia III with thin liquids Tolerating Diet: Yes  Tolerating PT: Eval noted.  DRUG ALLERGIES:   Allergies  Allergen Reactions  . Demerol [Meperidine]     Hives   . Strawberry (Diagnostic) Hives    VITALS:  Blood pressure 122/65, pulse 68, temperature 98.7 F (37.1 C), temperature source Oral, resp. rate 14, height 6\' 1"  (1.854 m), weight 86.3 kg (190 lb 4.8 oz), SpO2 95 %.  PHYSICAL EXAMINATION:   Physical Exam  GENERAL:  66 y.o.-year-old patient sitting up in chair in no acute distress.  EYES: Pupils equal, round, reactive to light and accommodation. No scleral icterus. Extraocular muscles intact.  HEENT: Head atraumatic, normocephalic. Oropharynx and nasopharynx clear.  NECK:  Supple, no jugular venous distention. No thyroid enlargement, no tenderness.  LUNGS: Poor Resp. effort, no wheezing, rales, rhonchi. No use of accessory muscles of respiration.  CARDIOVASCULAR: S1, S2 normal. No murmurs, rubs, or gallops.  ABDOMEN: Soft, nontender, nondistended. Bowel  sounds present. No organomegaly or mass.  EXTREMITIES: No cyanosis, clubbing or edema b/l.    NEUROLOGIC: Cranial nerves II through XII are intact. No focal Motor or sensory deficits b/l.  Globally weak PSYCHIATRIC: The patient is alert and  awake SKIN: No obvious rash, lesion, or ulcer.   LABORATORY PANEL:   CBC  Recent Labs Lab 09/08/17 0510  WBC 14.3*  HGB 9.7*  HCT 30.0*  PLT 461*   ------------------------------------------------------------------------------------------------------------------  Chemistries   Recent Labs Lab 09/06/17 1936  09/11/17 0550 09/12/17 0602  NA 147*  < > 138  --   K 3.3*  < > 3.2* 3.1*  CL 100*  < > 99*  --   CO2 36*  < > 30  --   GLUCOSE 116*  < > 107*  --   BUN 59*  < > 20  --   CREATININE 1.70*  < > 1.30*  --   CALCIUM 8.7*  < > 8.4*  --   MG 2.2  --   --   --   < > = values in this interval not displayed. ------------------------------------------------------------------------------------------------------------------  Cardiac Enzymes No results for input(s): TROPONINI in the last 168 hours. ------------------------------------------------------------------------------------------------------------------  RADIOLOGY:  No results found.   ASSESSMENT AND PLAN:   66 year old female with past medical history of hypertension, depression, anxiety, osteoarthritis who presented to the hospital due to shortness of breath and noted to be in acute respiratory failure secondary to pneumonia.  * Acute respiratory failure with hypoxia secondary to bilateral pneumonia and congestive heart failure  Finished antibiotics. Today on room air.  *  Acute on chronic diastolic congestive heart falure.  Diursed with IV Lasix. Good response. Now resolved. No signs of fluid overload  *  Sepsis present on amission due to bilateral pneumonia hasresolve  * Hypernatremia- sodium normalized now.  * Hypokalemia  Replace orally toda.  *  Acute toxic  metabolic  Encephalopathy and ICU delirium  CT scan, MRI of the brain - negative for acute neurologic process.  - appreciate Neuro input - mental status back to baseline now.   * Depression-continue Abilify, Prozac.  * Nutrition - PO intake improving Ensure enlive TID.  Dysphgia 3 diet  *  Acute kidney injury due to sepsis has resolved  Awaiting Insurance authorization for placement to skilled nursing facility.  All the records are reviewed and case discussed with Care Management/Social Worker. Management plans discussed with the patient, family and they are in agreement.  CODE STATUS: Full code  DVT Prophylaxis: Lovenox  TOTAL TIME TAKING CARE OF THIS PATIENT: 30 minutes.   POSSIBLE D/C IN 1-2 DAYS, DEPENDING ON CLINICAL CONDITION.  Hillary Bow R M.D on 09/12/2017 at 10:30 AM  Between 7am to 6pm - Pager - [phone]  After 6pm go to [email or web address] - Proofreader  [employer] [facility]  Office  [phone]  CC: Primary care physician; Javier Docker, MD

## 2017-09-12 NOTE — Care Management Important Message (Signed)
Important Message  Patient Details  Name: Morgan [name] MRN: [id number] Date of Birth: Aug 08, 1951   Medicare Important Message Given:  Yes    Beverly Sessions, RN 09/12/2017, 2:46 PM

## 2017-09-30 ENCOUNTER — Ambulatory Visit: Payer: Medicare HMO | Admitting: Gastroenterology

## 2017-09-30 ENCOUNTER — Other Ambulatory Visit: Payer: Self-pay

## 2017-10-04 ENCOUNTER — Emergency Department: Payer: Medicare HMO

## 2017-10-04 ENCOUNTER — Observation Stay
Admission: EM | Admit: 2017-10-04 | Discharge: 2017-10-06 | Disposition: A | Discharge status: Home / Self Care | Payer: Medicare HMO | Attending: Internal Medicine | Admitting: Internal Medicine

## 2017-10-04 DIAGNOSIS — G8194 Hemiplegia, unspecified affecting left nondominant side: Secondary | ICD-10-CM | POA: Insufficient documentation

## 2017-10-04 DIAGNOSIS — E785 Hyperlipidemia, unspecified: Secondary | ICD-10-CM | POA: Diagnosis not present

## 2017-10-04 DIAGNOSIS — F319 Bipolar disorder, unspecified: Secondary | ICD-10-CM | POA: Diagnosis not present

## 2017-10-04 DIAGNOSIS — Z79899 Other long term (current) drug therapy: Secondary | ICD-10-CM | POA: Insufficient documentation

## 2017-10-04 DIAGNOSIS — K219 Gastro-esophageal reflux disease without esophagitis: Secondary | ICD-10-CM | POA: Insufficient documentation

## 2017-10-04 DIAGNOSIS — R531 Weakness: Secondary | ICD-10-CM

## 2017-10-04 DIAGNOSIS — R2689 Other abnormalities of gait and mobility: Secondary | ICD-10-CM | POA: Diagnosis not present

## 2017-10-04 DIAGNOSIS — I639 Cerebral infarction, unspecified: Secondary | ICD-10-CM | POA: Diagnosis present

## 2017-10-04 DIAGNOSIS — Z7982 Long term (current) use of aspirin: Secondary | ICD-10-CM | POA: Diagnosis not present

## 2017-10-04 DIAGNOSIS — M6281 Muscle weakness (generalized): Secondary | ICD-10-CM | POA: Diagnosis not present

## 2017-10-04 DIAGNOSIS — F419 Anxiety disorder, unspecified: Secondary | ICD-10-CM | POA: Diagnosis not present

## 2017-10-04 DIAGNOSIS — I10 Essential (primary) hypertension: Secondary | ICD-10-CM | POA: Diagnosis not present

## 2017-10-04 DIAGNOSIS — I341 Nonrheumatic mitral (valve) prolapse: Secondary | ICD-10-CM | POA: Diagnosis not present

## 2017-10-04 DIAGNOSIS — Z87891 Personal history of nicotine dependence: Secondary | ICD-10-CM | POA: Diagnosis not present

## 2017-10-04 LAB — COMPREHENSIVE METABOLIC PANEL
ALT: 14 U/L (ref 14–54)
ANION GAP: 8 (ref 5–15)
AST: 21 U/L (ref 15–41)
Albumin: 3.1 g/dL — ABNORMAL LOW (ref 3.5–5.0)
Alkaline Phosphatase: 152 U/L — ABNORMAL HIGH (ref 38–126)
BILIRUBIN TOTAL: 0.7 mg/dL (ref 0.3–1.2)
BUN: 18 mg/dL (ref 6–20)
CO2: 24 mmol/L (ref 22–32)
Calcium: 8.5 mg/dL — ABNORMAL LOW (ref 8.9–10.3)
Chloride: 107 mmol/L (ref 101–111)
Creatinine, Ser: 1.54 mg/dL — ABNORMAL HIGH (ref 0.44–1.00)
GFR, EST AFRICAN AMERICAN: 40 mL/min — AB (ref >60)
GFR, EST NON AFRICAN AMERICAN: 35 mL/min — AB (ref >60)
Glucose, Bld: 115 mg/dL — ABNORMAL HIGH (ref 65–99)
POTASSIUM: 4.7 mmol/L (ref 3.5–5.1)
Sodium: 139 mmol/L (ref 135–145)
TOTAL PROTEIN: 6.6 g/dL (ref 6.5–8.1)

## 2017-10-04 LAB — TROPONIN I

## 2017-10-04 LAB — DIFFERENTIAL
Basophils Absolute: 0 10*3/uL (ref 0–0.1)
Basophils Relative: 0 %
EOS ABS: 0.4 10*3/uL (ref 0–0.7)
EOS PCT: 5 %
LYMPHS ABS: 2.8 10*3/uL (ref 1.0–3.6)
Lymphocytes Relative: 35 %
MONO ABS: 0.6 10*3/uL (ref 0.2–0.9)
MONOS PCT: 7 %
Neutro Abs: 4.2 10*3/uL (ref 1.4–6.5)
Neutrophils Relative %: 53 %

## 2017-10-04 LAB — CBC
HEMATOCRIT: 28 % — AB (ref 35.0–47.0)
HEMOGLOBIN: 9.1 g/dL — AB (ref 12.0–16.0)
MCH: 26.2 pg (ref 26.0–34.0)
MCHC: 32.5 g/dL (ref 32.0–36.0)
MCV: 80.6 fL (ref 80.0–100.0)
Platelets: 287 10*3/uL (ref 150–440)
RBC: 3.48 MIL/uL — ABNORMAL LOW (ref 3.80–5.20)
RDW: 16.4 % — AB (ref 11.5–14.5)
WBC: 8 10*3/uL (ref 3.6–11.0)

## 2017-10-04 LAB — GLUCOSE, CAPILLARY: GLUCOSE-CAPILLARY: 120 mg/dL — AB (ref 65–99)

## 2017-10-04 LAB — APTT: aPTT: 29 seconds (ref 24–36)

## 2017-10-04 LAB — PROTIME-INR
INR: 0.95
Prothrombin Time: 12.6 seconds (ref 11.4–15.2)

## 2017-10-04 NOTE — ED Triage Notes (Signed)
Patient c/o weakness in right side, has visible right-sided facial droop beginng upon awakening this am at 07:30. Last known well last night at 2000 yesterday.

## 2017-10-04 NOTE — ED Provider Notes (Signed)
[facility] Emergency Department Provider Note   ____________________________________________    I have reviewed the triage vital signs and the nursing notes.   HISTORY  Chief Complaint Code Stroke     HPI Morgan [name] is a 66 y.o. female who presents with right sided weakness which she woke up with.  Patient with recent history of stroke leaving her with left-sided deficits and prolonged hospital stay, discharged a few weeks ago.  Patient reports she woke up this morning with weakness in her right arm, she was able to get herself out of bed and decided to go play with her grandchildren but apparently fell because her right leg felt weak.  Symptoms have not improved significantly throughout the day.   Past Medical History:  Diagnosis Date  . Allergy   . Anemia   . Anxiety   . Arthritis   . Bipolar 1 disorder ([place])   . Depression   . GERD (gastroesophageal reflux disease)   . Hyperlipidemia   . Hypertension   . MVP (mitral valve prolapse)     Patient Active Problem List   Diagnosis Date Noted  . Acute respiratory failure ([place])   . Anxiety 08/26/2017  . Sepsis ([place]) 08/26/2017  . HCAP (healthcare-associated pneumonia) 08/26/2017  . Essential hypertension   . Depression   . CAP (community acquired pneumonia) 02/22/2015  . Cough 02/22/2015  . Diarrhea 02/22/2015  . Hypokalemia 02/22/2015  . Tobacco abuse 02/22/2015  . Hypertension   . Mass of breast, right 11/18/2013  . Chronic lower [name] pain 04/29/2013  . Paresthesia 04/29/2013    Past Surgical History:  Procedure Laterality Date  . ABDOMINAL HYSTERECTOMY    . APPENDECTOMY    . BREAST SURGERY Right 1988   tumor removal  . KNEE SURGERY  2005   2 times left  . TUBAL LIGATION      Prior to Admission medications   Medication Sig Start Date End Date Taking? Authorizing Provider  acetaminophen (TYLENOL) 500 MG tablet Take 1,000 mg by mouth every 6 (six) hours as needed for  moderate pain (pain).     [provider]  albuterol (PROVENTIL HFA;VENTOLIN HFA) 108 (90 BASE) MCG/ACT inhaler Inhale 2 puffs into the lungs every 4 (four) hours as needed for wheezing or shortness of breath. 02/24/15   Ghimire, Henreitta Leber, MD  amLODipine (NORVASC) 2.5 MG tablet Take 2.5 mg by mouth daily.    [provider]  ARIPiprazole (ABILIFY) 15 MG tablet Take 15 mg by mouth daily.     [provider]  benztropine (COGENTIN) 0.5 MG tablet Take 0.5 mg by mouth 2 (two) times daily.    [provider]  feeding supplement, ENSURE ENLIVE, (ENSURE ENLIVE) LIQD Take 237 mLs by mouth 3 (three) times daily between meals. 09/12/17   Hillary Bow, MD  FLUoxetine (PROZAC) 20 MG capsule Take 20 mg by mouth daily.    [provider]  gabapentin (NEURONTIN) 300 MG capsule Take 1 capsule (300 mg total) by mouth 3 (three) times daily as needed. 09/12/17   Hillary Bow, MD  hydrOXYzine (VISTARIL) 25 MG capsule Take 1 capsule (25 mg total) by mouth 3 (three) times daily as needed for anxiety. Take 25 mg by mouth twice daily and take 50 mg by mouth at bedtime as needed 09/12/17   Hillary Bow, MD  mirtazapine (REMERON) 45 MG tablet Take 45 mg by mouth at bedtime.     [provider]  pantoprazole ([place])  40 MG tablet Take 40 mg by mouth daily.    [provider]  potassium chloride (K-DUR) 10 MEQ tablet Take 10 mEq by mouth daily.    [provider]     Allergies Demerol [meperidine] and Strawberry (diagnostic)  Family History  Problem Relation Age of Onset  . Breast cancer Mother   . Ovarian cancer Mother   . Hypertension Father   . Hyperlipidemia Father   . Heart disease Father   . Breast cancer Maternal Grandmother   . Cancer Paternal Grandmother   . Diabetes Paternal Grandmother   . Cancer Paternal Grandfather   . Breast cancer Maternal Aunt   . Breast cancer Maternal Aunt     Social History Social History  Substance  Use Topics  . Smoking status: Former Smoker    Types: Cigarettes  . Smokeless tobacco: Never Used  . Alcohol use Yes    Review of Systems  Constitutional: No fever/chills Eyes: No visual changes.  ENT: No sore throat. Cardiovascular: Denies chest pain. Respiratory: Denies shortness of breath. Gastrointestinal: No abdominal pain.  No nausea, no vomiting.   Genitourinary: Negative for dysuria. Musculoskeletal: Negative for [name] pain. Skin: Negative for rash. Neurological: Negative for headaches    ____________________________________________   PHYSICAL EXAM:  VITAL SIGNS: ED Triage Vitals  Enc Vitals Group     BP 10/04/17 2200 116/72     Pulse Rate 10/04/17 2200 75     Resp 10/04/17 2200 (!) 27     Temp --      Temp src --      SpO2 10/04/17 2200 99 %     Weight --      Height --      Head Circumference --      Peak Flow --      Pain Score 10/04/17 2134 6     Pain Loc --      Pain Edu? --      Excl. in [place]? --     Constitutional: Alert and oriented. No acute distress.  Eyes: Conjunctivae are normal.   Nose: No congestion/rhinnorhea. Mouth/Throat: Mucous membranes are moist.   Neck:  Painless ROM Cardiovascular: Normal rate, regular rhythm. Grossly normal heart sounds.  Good peripheral circulation. Respiratory: Normal respiratory effort.  No retractions. Lungs CTAB. Gastrointestinal: Soft and nontender. No distention.   Genitourinary: deferred Musculoskeletal: No lower extremity tenderness nor edema.  Warm and well perfused Neurologic:  Normal speech and language.  Some weakness in right grip strength, patient is able to lift her right leg against gravity with significant difficulty.  Possible right facial droop Skin:  Skin is warm, dry and intact. No rash noted. Psychiatric: Mood and affect are normal. Speech and behavior are normal.  ____________________________________________   LABS (all labs ordered are listed, but only abnormal results are  displayed)  Labs Reviewed  CBC - Abnormal; Notable for the following:       Result Value   RBC 3.48 (*)    Hemoglobin 9.1 (*)    HCT 28.0 (*)    RDW 16.4 (*)    All other components within normal limits  COMPREHENSIVE METABOLIC PANEL - Abnormal; Notable for the following:    Glucose, Bld 115 (*)    Creatinine, Ser 1.54 (*)    Calcium 8.5 (*)    Albumin 3.1 (*)    Alkaline Phosphatase 152 (*)    GFR calc non Af Amer 35 (*)    GFR calc Af Amer 40 (*)  All other components within normal limits  GLUCOSE, CAPILLARY - Abnormal; Notable for the following:    Glucose-Capillary 120 (*)    All other components within normal limits  PROTIME-INR  APTT  DIFFERENTIAL  TROPONIN I  CBG MONITORING, ED   ____________________________________________  EKG  ED ECG REPORT I, Lavonia Drafts, the attending physician, personally viewed and interpreted this ECG.  Date: 10/04/2017  Rhythm: normal sinus rhythm QRS Axis: normal Intervals: normal ST/T Wave abnormalities: Nonspecific changes Narrative Interpretation: no evidence of acute ischemia  ____________________________________________  RADIOLOGY  CT head unremarkable ____________________________________________   PROCEDURES  Procedure(s) performed: No    Critical Care performed: No ____________________________________________   INITIAL IMPRESSION / ASSESSMENT AND PLAN / ED COURSE  Pertinent labs & imaging results that were available during my care of the patient were reviewed by me and considered in my medical decision making (see chart for details).  Patient presents with new right-sided weakness with a history of CVA.  CT scan is reassuring however she does have weakness on the right.  Symptoms started when she woke up this morning so she is not within the window for TPA.  Lab work is overall reassuring, will admit for further evaluation    ____________________________________________   FINAL CLINICAL IMPRESSION(S)  / ED DIAGNOSES  Final diagnoses:  Cerebrovascular accident (CVA), unspecified mechanism ([place])      NEW MEDICATIONS STARTED DURING THIS VISIT:  New Prescriptions   No medications on file     Note:  This document was prepared using Dragon voice recognition software and may include unintentional dictation errors.    Lavonia Drafts, MD 10/04/17 [phone]

## 2017-10-04 NOTE — ED Notes (Signed)
ED Provider at bedside. 

## 2017-10-04 NOTE — H&P (Signed)
[place] at [place] NAME: Morgan [name]    MR#:  [id number]  DATE OF BIRTH:  03/19/1951  DATE OF ADMISSION:  10/04/2017  PRIMARY CARE PHYSICIAN: [name], [name], MD   REQUESTING/REFERRING PHYSICIAN: Corky Downs, MD  CHIEF COMPLAINT:   Chief Complaint  Patient presents with  . Code Stroke    HISTORY OF PRESENT ILLNESS:  Morgan [name]  is a 66 y.o. female who presents with right-sided weakness. Patient states that she woke up and fell due to her right-sided weakness. She waited some time to see if it would improve, and when it didn't get better she called her home health nurse who told her to come straight to the hospital. She came late in the evening. Given her persistent symptoms hospitals were called for admission for workup of stroke. Patient recently had a stroke which left her with some left-sided deficits.  PAST MEDICAL HISTORY:   Past Medical History:  Diagnosis Date  . Allergy   . Anemia   . Anxiety   . Arthritis   . Bipolar 1 disorder ([place])   . Depression   . GERD (gastroesophageal reflux disease)   . Hyperlipidemia   . Hypertension   . MVP (mitral valve prolapse)     PAST SURGICAL HISTORY:   Past Surgical History:  Procedure Laterality Date  . ABDOMINAL HYSTERECTOMY    . APPENDECTOMY    . BREAST SURGERY Right 1988   tumor removal  . KNEE SURGERY  2005   2 times left  . TUBAL LIGATION      SOCIAL HISTORY:   Social History  Substance Use Topics  . Smoking status: Former Smoker    Types: Cigarettes  . Smokeless tobacco: Never Used  . Alcohol use Yes    FAMILY HISTORY:   Family History  Problem Relation Age of Onset  . Breast cancer Mother   . Ovarian cancer Mother   . Hypertension Father   . Hyperlipidemia Father   . Heart disease Father   . Breast cancer Maternal Grandmother   . Cancer Paternal Grandmother   . Diabetes Paternal Grandmother   . Cancer Paternal Grandfather   . Breast cancer  Maternal Aunt   . Breast cancer Maternal Aunt     DRUG ALLERGIES:   Allergies  Allergen Reactions  . Demerol [Meperidine] Hives  . Strawberry (Diagnostic) Hives    MEDICATIONS AT HOME:   Prior to Admission medications   Medication Sig Start Date End Date Taking? Authorizing Provider  acetaminophen (TYLENOL) 500 MG tablet Take 1,000 mg by mouth every 6 (six) hours as needed for moderate pain (pain).    Yes [provider]  albuterol (PROVENTIL HFA;VENTOLIN HFA) 108 (90 BASE) MCG/ACT inhaler Inhale 2 puffs into the lungs every 4 (four) hours as needed for wheezing or shortness of breath. 02/24/15  Yes Ghimire, Henreitta Leber, MD  amLODipine (NORVASC) 2.5 MG tablet Take 2.5 mg by mouth daily.   Yes [provider]  ARIPiprazole (ABILIFY) 15 MG tablet Take 15 mg by mouth daily.    Yes [provider]  benztropine (COGENTIN) 0.5 MG tablet Take 0.5 mg by mouth 2 (two) times daily.   Yes [provider]  FLUoxetine (PROZAC) 20 MG capsule Take 20 mg by mouth daily.   Yes [provider]  gabapentin (NEURONTIN) 300 MG capsule Take 1 capsule (300 mg total) by mouth 3 (three) times daily as needed. Patient taking differently: Take 300 mg by  mouth 3 (three) times daily as needed (pain).  09/12/17  Yes Hillary Bow, MD  hydrOXYzine (VISTARIL) 25 MG capsule Take 1 capsule (25 mg total) by mouth 3 (three) times daily as needed for anxiety. Take 25 mg by mouth twice daily and take 50 mg by mouth at bedtime as needed 09/12/17  Yes Sudini, Alveta Heimlich, MD  mirtazapine (REMERON) 45 MG tablet Take 45 mg by mouth at bedtime.    Yes [provider]  pantoprazole (PROTONIX) 40 MG tablet Take 40 mg by mouth daily.   Yes [provider]  potassium chloride (K-DUR) 10 MEQ tablet Take 10 mEq by mouth daily.   Yes [provider]  nabumetone (RELAFEN) 500 MG tablet Take 500 mg by mouth 2 (two) times daily as needed for pain. 10/01/17   [provider]    REVIEW OF SYSTEMS:  Review of Systems  Constitutional: Negative for chills, fever, malaise/fatigue and weight loss.  HENT: Negative for ear pain, hearing loss and tinnitus.   Eyes: Negative for blurred vision, double vision, pain and redness.  Respiratory: Negative for cough, hemoptysis and shortness of breath.   Cardiovascular: Negative for chest pain, palpitations, orthopnea and leg swelling.  Gastrointestinal: Negative for abdominal pain, constipation, diarrhea, nausea and vomiting.  Genitourinary: Negative for dysuria, frequency and hematuria.  Musculoskeletal: Negative for back pain, joint pain and neck pain.  Skin:       No acne, rash, or lesions  Neurological: Positive for focal weakness. Negative for dizziness, tremors and weakness.  Endo/Heme/Allergies: Negative for polydipsia. Does not bruise/bleed easily.  Psychiatric/Behavioral: Negative for depression. The patient is not nervous/anxious and does not have insomnia.      VITAL SIGNS:   Vitals:   10/04/17 2215 10/04/17 2230 10/04/17 2245 10/04/17 2300  BP: 126/68 123/75 117/71 125/69  Pulse: 73 71 70 71  Resp: (!) 22 (!) 25 18 19   SpO2: 99% 100% 100% 100%   Wt Readings from Last 3 Encounters:  09/12/17 86.3 kg (190 lb 4.8 oz)  05/15/16 91.5 kg (201 lb 12.8 oz)  11/30/15 88.9 kg (196 lb)    PHYSICAL EXAMINATION:  Physical Exam  Vitals reviewed. Constitutional: She is oriented to person, place, and time. She appears well-developed and well-nourished. No distress.  HENT:  Head: Normocephalic and atraumatic.  Mouth/Throat: Oropharynx is clear and moist.  Eyes: Pupils are equal, round, and reactive to light. Conjunctivae and EOM are normal. No scleral icterus.  Neck: Normal range of motion. Neck supple. No JVD present. No thyromegaly present.  Cardiovascular: Normal rate, regular rhythm and intact distal pulses.  Exam reveals no gallop and no friction rub.   No murmur heard. Respiratory: Effort  normal and breath sounds normal. No respiratory distress. She has no wheezes. She has no rales.  GI: Soft. Bowel sounds are normal. She exhibits no distension. There is no tenderness.  Musculoskeletal: Normal range of motion. She exhibits no edema.  No arthritis, no gout  Lymphadenopathy:    She has no cervical adenopathy.  Neurological: She is alert and oriented to person, place, and time. No cranial nerve deficit.  Neurologic: Cranial nerves II-XII intact, Sensation intact to light touch/pinprick on left but decreased on right, 4/5 strength in LUE, 3/5 strength LLE RUE RLE, no dysarthria, no aphasia, no dysphagia, memory intact   Skin: Skin is warm and dry. No rash noted. No erythema.  Psychiatric: She has a normal mood and affect. Her behavior is normal. Judgment and thought content normal.  LABORATORY PANEL:   CBC  Recent Labs Lab 10/04/17 2149  WBC 8.0  HGB 9.1*  HCT 28.0*  PLT 287   ------------------------------------------------------------------------------------------------------------------  Chemistries   Recent Labs Lab 10/04/17 2149  NA 139  K 4.7  CL 107  CO2 24  GLUCOSE 115*  BUN 18  CREATININE 1.54*  CALCIUM 8.5*  AST 21  ALT 14  ALKPHOS 152*  BILITOT 0.7   ------------------------------------------------------------------------------------------------------------------  Cardiac Enzymes  Recent Labs Lab 10/04/17 2149  TROPONINI <0.03   ------------------------------------------------------------------------------------------------------------------  RADIOLOGY:  Ct Head Code Stroke Wo Contrast  Result Date: 10/04/2017 CLINICAL DATA:  Code stroke.  Right-sided weakness EXAM: CT HEAD WITHOUT CONTRAST TECHNIQUE: Contiguous axial images were obtained from the base of the skull through the vertex without intravenous contrast. COMPARISON:  Brain MRI 09/05/2017 FINDINGS: Brain: No mass lesion or acute hemorrhage. No focal hypoattenuation of the  basal ganglia or cortex to indicate infarcted tissue. No hydrocephalus or age advanced atrophy. Vascular: No hyperdense vessel. No advanced atherosclerotic calcification of the arteries at the skull base. Skull: Normal visualized skull base, calvarium and extracranial soft tissues. Sinuses/Orbits: No sinus fluid levels or advanced mucosal thickening. No mastoid effusion. Normal orbits. ASPECTS [facility] Stroke Program Early CT Score) - Ganglionic level infarction (caudate, lentiform nuclei, internal capsule, insula, M1-M3 cortex): 7 - Supraganglionic infarction (M4-M6 cortex): 3 Total score (0-10 with 10 being normal): 10 IMPRESSION: 1. Normal head CT 2. ASPECTS is 10. These results were called by telephone at the time of interpretation on 10/04/2017 at 9:55 pm to Dr. Lavonia Drafts , who verbally acknowledged these results. Electronically Signed   By: Ulyses Jarred M.D.   On: 10/04/2017 21:55    EKG:   Orders placed or performed during the hospital encounter of 10/04/17  . ED EKG  . ED EKG  . EKG 12-Lead  . EKG 12-Lead    IMPRESSION AND PLAN:  Principal Problem:   Right sided weakness - admit her stroke admission orders set with appropriate imaging, labs, consults including neurology consult Active Problems:   Essential hypertension - hold antihypertensives for now for permissive hypertension with a blood pressure goal less than 220/120, restart antihypertensives tomorrow   Anxiety - home dose anxiolytics   HLD (hyperlipidemia) - home dose antilipid   GERD (gastroesophageal reflux disease) - home dose PPI  All the records are reviewed and case discussed with ED provider. Management plans discussed with the patient and/or family.  DVT PROPHYLAXIS: SubQ lovenox  GI PROPHYLAXIS: PPI  ADMISSION STATUS: Observation  CODE STATUS: Full Code Status History    Date Active Date Inactive Code Status Order ID Comments User Context   08/26/2017 10:12 PM 09/12/2017  8:32 PM Full Code [id number]   Lance Coon, MD Inpatient   02/23/2015 12:58 AM 02/24/2015 12:53 PM Full Code [id number]  Ivor Costa, MD Inpatient    Advance Directive Documentation     Most Recent Value  Type of Advance Directive  Healthcare Power of Attorney  Pre-existing out of facility DNR order (yellow form or pink MOST form)  -  "MOST" Form in Place?  -      TOTAL TIME TAKING CARE OF THIS PATIENT: 40 minutes.   Morgan [name] 10/04/2017, 11:56 PM  [employer]  [phone]  CC: Primary care physician; Javier Docker, MD  Note:  This document was prepared using Dragon voice recognition software and may include unintentional dictation errors.

## 2017-10-05 ENCOUNTER — Observation Stay: Payer: Medicare HMO

## 2017-10-05 DIAGNOSIS — R531 Weakness: Secondary | ICD-10-CM | POA: Diagnosis not present

## 2017-10-05 LAB — CREATININE, SERUM
CREATININE: 1.21 mg/dL — AB (ref 0.44–1.00)
GFR, EST AFRICAN AMERICAN: 54 mL/min — AB (ref >60)
GFR, EST NON AFRICAN AMERICAN: 46 mL/min — AB (ref >60)

## 2017-10-05 LAB — LIPID PANEL
Cholesterol: 195 mg/dL (ref 0–200)
HDL: 45 mg/dL (ref >40)
LDL Cholesterol: 116 mg/dL — ABNORMAL HIGH (ref 0–99)
Total CHOL/HDL Ratio: 4.3 RATIO
Triglycerides: 169 mg/dL — ABNORMAL HIGH (ref <150)
VLDL: 34 mg/dL (ref 0–40)

## 2017-10-05 LAB — CBC
HEMATOCRIT: 26 % — AB (ref 35.0–47.0)
Hemoglobin: 8.6 g/dL — ABNORMAL LOW (ref 12.0–16.0)
MCH: 26.6 pg (ref 26.0–34.0)
MCHC: 33.2 g/dL (ref 32.0–36.0)
MCV: 80.2 fL (ref 80.0–100.0)
Platelets: 259 10*3/uL (ref 150–440)
RBC: 3.24 MIL/uL — AB (ref 3.80–5.20)
RDW: 16.5 % — ABNORMAL HIGH (ref 11.5–14.5)
WBC: 6.2 10*3/uL (ref 3.6–11.0)

## 2017-10-05 MED ORDER — ENOXAPARIN SODIUM 40 MG/0.4ML ~~LOC~~ SOLN
40.0000 mg | SUBCUTANEOUS | Status: DC
  Administered 2017-10-05: 20:00:00 40 mg via SUBCUTANEOUS
  Filled 2017-10-05: qty 0.4

## 2017-10-05 MED ORDER — ARIPIPRAZOLE 15 MG PO TABS
15.0000 mg | ORAL_TABLET | Freq: Every day | ORAL | Status: DC
  Administered 2017-10-05 – 2017-10-06 (×2): 15 mg via ORAL
  Filled 2017-10-05 (×2): qty 1

## 2017-10-05 MED ORDER — ACETAMINOPHEN 650 MG RE SUPP: 650.0000 mg | RECTAL | Status: DC | PRN

## 2017-10-05 MED ORDER — BENZTROPINE MESYLATE 0.5 MG PO TABS
0.5000 mg | ORAL_TABLET | Freq: Two times a day (BID) | ORAL | Status: DC
  Administered 2017-10-05 – 2017-10-06 (×2): 0.5 mg via ORAL
  Filled 2017-10-05 (×3): qty 1

## 2017-10-05 MED ORDER — ASPIRIN EC 325 MG PO TBEC
325.0000 mg | DELAYED_RELEASE_TABLET | Freq: Every day | ORAL | Status: DC
  Administered 2017-10-05 – 2017-10-06 (×2): 325 mg via ORAL
  Filled 2017-10-05 (×2): qty 1

## 2017-10-05 MED ORDER — MIRTAZAPINE 15 MG PO TABS
45.0000 mg | ORAL_TABLET | Freq: Every day | ORAL | Status: DC
  Administered 2017-10-05: 45 mg via ORAL
  Filled 2017-10-05: qty 3

## 2017-10-05 MED ORDER — GABAPENTIN 300 MG PO CAPS
300.0000 mg | ORAL_CAPSULE | Freq: Three times a day (TID) | ORAL | Status: DC
  Administered 2017-10-05 – 2017-10-06 (×3): 300 mg via ORAL
  Filled 2017-10-05 (×3): qty 1

## 2017-10-05 MED ORDER — FLUOXETINE HCL 20 MG PO CAPS
20.0000 mg | ORAL_CAPSULE | Freq: Every day | ORAL | Status: DC
  Administered 2017-10-05 – 2017-10-06 (×2): 20 mg via ORAL
  Filled 2017-10-05 (×2): qty 1

## 2017-10-05 MED ORDER — ACETAMINOPHEN 160 MG/5ML PO SOLN
650.0000 mg | ORAL | Status: DC | PRN
  Filled 2017-10-05: qty 20.3

## 2017-10-05 MED ORDER — ACETAMINOPHEN 325 MG PO TABS
650.0000 mg | ORAL_TABLET | ORAL | Status: DC | PRN
  Administered 2017-10-05 – 2017-10-06 (×4): 650 mg via ORAL
  Filled 2017-10-05 (×4): qty 2

## 2017-10-05 MED ORDER — AMLODIPINE BESYLATE 5 MG PO TABS
2.5000 mg | ORAL_TABLET | Freq: Every day | ORAL | Status: DC
  Administered 2017-10-05 – 2017-10-06 (×2): 2.5 mg via ORAL
  Filled 2017-10-05 (×2): qty 1

## 2017-10-05 MED ORDER — HYDROXYZINE HCL 50 MG PO TABS
50.0000 mg | ORAL_TABLET | Freq: Every evening | ORAL | Status: DC | PRN
  Filled 2017-10-05: qty 1

## 2017-10-05 MED ORDER — ALBUTEROL SULFATE (2.5 MG/3ML) 0.083% IN NEBU: 2.5000 mg | INHALATION_SOLUTION | RESPIRATORY_TRACT | Status: DC | PRN

## 2017-10-05 MED ORDER — PANTOPRAZOLE SODIUM 40 MG PO TBEC
40.0000 mg | DELAYED_RELEASE_TABLET | Freq: Every day | ORAL | Status: DC
  Administered 2017-10-05 – 2017-10-06 (×2): 40 mg via ORAL
  Filled 2017-10-05 (×2): qty 1

## 2017-10-05 MED ORDER — HYDROXYZINE HCL 25 MG PO TABS
25.0000 mg | ORAL_TABLET | Freq: Two times a day (BID) | ORAL | Status: DC | PRN
  Filled 2017-10-05: qty 1

## 2017-10-05 MED ORDER — ALBUTEROL SULFATE HFA 108 (90 BASE) MCG/ACT IN AERS: 2.0000 | INHALATION_SPRAY | RESPIRATORY_TRACT | Status: DC | PRN

## 2017-10-05 MED ORDER — STROKE: EARLY STAGES OF RECOVERY BOOK
Freq: Once | Status: AC
  Administered 2017-10-05: 02:00:00

## 2017-10-05 NOTE — Consult Note (Signed)
Reason for Consult:Right sided weakness Referring Physician: Posey Pronto  CC: Right sided weakness  HPI: Morgan [name] is an 66 y.o. female recently discharged from the hospital where she was seen by neurology and diagnosed with a metabolic encephalopathy.  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  Patient reports she woke up yesterday morning with weakness in her right arm, she was able to get herself out of bed and decided to go play with her grandchildren but apparently fell because her right leg felt weak.  Symptoms did not improve and she presented for further evaluation.   Patient seen as an outpatient by neurology for right sided headache.  Past Medical History:  Diagnosis Date  . Allergy   . Anemia   . Anxiety   . Arthritis   . Bipolar 1 disorder ([place])   . Depression   . GERD (gastroesophageal reflux disease)   . Hyperlipidemia   . Hypertension   . MVP (mitral valve prolapse)     Past Surgical History:  Procedure Laterality Date  . ABDOMINAL HYSTERECTOMY    . APPENDECTOMY    . BREAST SURGERY Right 1988   tumor removal  . KNEE SURGERY  2005   2 times left  . TUBAL LIGATION      Family History  Problem Relation Age of Onset  . Breast cancer Mother   . Ovarian cancer Mother   . Hypertension Father   . Hyperlipidemia Father   . Heart disease Father   . Breast cancer Maternal Grandmother   . Cancer Paternal Grandmother   . Diabetes Paternal Grandmother   . Cancer Paternal Grandfather   . Breast cancer Maternal Aunt   . Breast cancer Maternal Aunt     Social History:  reports that she has quit smoking. Her smoking use included Cigarettes. She has never used smokeless tobacco. She reports that she drinks alcohol. She reports that she does not use drugs.  Allergies  Allergen Reactions  . Demerol [Meperidine] Hives  .  Strawberry (Diagnostic) Hives    Medications:  I have reviewed the patient's current medications. Prior to Admission:  Prescriptions Prior to Admission  Medication Sig Dispense Refill Last Dose  . acetaminophen (TYLENOL) 500 MG tablet Take 1,000 mg by mouth every 6 (six) hours as needed for moderate pain (pain).    prn at prn  . albuterol (PROVENTIL HFA;VENTOLIN HFA) 108 (90 BASE) MCG/ACT inhaler Inhale 2 puffs into the lungs every 4 (four) hours as needed for wheezing or shortness of breath. 1 Inhaler 0 prn at prn  . amLODipine (NORVASC) 2.5 MG tablet Take 2.5 mg by mouth daily.   10/04/2017 at Unknown time  . ARIPiprazole (ABILIFY) 15 MG tablet Take 15 mg by mouth daily.    10/04/2017 at Unknown time  . benztropine (COGENTIN) 0.5 MG tablet Take 0.5 mg by mouth 2 (two) times daily.   10/04/2017 at Unknown time  . FLUoxetine (PROZAC) 20 MG capsule Take 20 mg by mouth daily.   10/04/2017 at Unknown time  . gabapentin (NEURONTIN) 300 MG capsule Take 1 capsule (300 mg total) by mouth 3 (three) times daily as needed. (Patient taking differently: Take 300 mg by mouth 3 (three) times daily as needed (pain). )   prn at prn  . hydrOXYzine (VISTARIL) 25 MG capsule Take 1 capsule (25 mg total) by mouth  3 (three) times daily as needed for anxiety. Take 25 mg by mouth twice daily and take 50 mg by mouth at bedtime as needed 30 capsule 0 prn at prn  . mirtazapine (REMERON) 45 MG tablet Take 45 mg by mouth at bedtime.    10/03/2017 at Unknown time  . nabumetone (RELAFEN) 500 MG tablet Take 500 mg by mouth 2 (two) times daily as needed for pain.  11 prn at prn  . pantoprazole (PROTONIX) 40 MG tablet Take 40 mg by mouth daily.   10/04/2017 at Unknown time  . potassium chloride (K-DUR) 10 MEQ tablet Take 10 mEq by mouth daily.   10/04/2017 at Unknown time   Scheduled: . ARIPiprazole  15 mg Oral Daily  . aspirin EC  325 mg Oral Daily  . enoxaparin (LOVENOX) injection  40 mg Subcutaneous Q24H  . FLUoxetine  20  mg Oral Daily  . pantoprazole  40 mg Oral Daily    ROS: History obtained from the patient  General ROS: negative for - chills, fatigue, fever, night sweats, weight gain or weight loss Psychological ROS: negative for - behavioral disorder, hallucinations, memory difficulties, mood swings or suicidal ideation Ophthalmic ROS: blurred vision ENT ROS: negative for - epistaxis, nasal discharge, oral lesions, sore throat, tinnitus or vertigo Allergy and Immunology ROS: negative for - hives or itchy/watery eyes Hematological and Lymphatic ROS: negative for - bleeding problems, bruising or swollen lymph nodes Endocrine ROS: negative for - galactorrhea, hair pattern changes, polydipsia/polyuria or temperature intolerance Respiratory ROS: negative for - cough, hemoptysis, shortness of breath or wheezing Cardiovascular ROS: negative for - chest pain, dyspnea on exertion, edema or irregular heartbeat Gastrointestinal ROS: negative for - abdominal pain, diarrhea, hematemesis, nausea/vomiting or stool incontinence Genito-Urinary ROS: negative for - dysuria, hematuria, incontinence or urinary frequency/urgency Musculoskeletal ROS: neck pain, joint pain Neurological ROS: as noted in HPI Dermatological ROS: negative for rash and skin lesion changes  Physical Examination: Blood pressure 127/66, pulse 61, temperature 97.9 F (36.6 C), temperature source Oral, resp. rate 18, height 6\' 1"  (1.854 m), weight 93.2 kg (205 lb 6.4 oz), SpO2 100 %.  HEENT-  Normocephalic, no lesions, without obvious abnormality.  Normal external eye and conjunctiva.  Normal TM's bilaterally.  Normal auditory canals and external ears. Normal external nose, mucus membranes and septum.  Normal pharynx. Cardiovascular- S1, S2 normal, pulses palpable throughout   Lungs- chest clear, no wheezing, rales, normal symmetric air entry Abdomen- soft, non-tender; bowel sounds normal; no masses,  no organomegaly Extremities- no edema Lymph-no  adenopathy palpable Musculoskeletal-pain on palpation of the right neck and occipital ridge Skin-warm and dry, no hyperpigmentation, vitiligo, or suspicious lesions  Neurological Examination   Mental Status: Alert, oriented, thought content appropriate.  Speech fluent without evidence of aphasia.  Able to follow 3 step commands without difficulty. Cranial Nerves: II: Discs flat bilaterally; Visual fields grossly normal with right eye blurry vision, pupils equal, round, reactive to light and accommodation III,IV, VI: ptosis not present, extra-ocular motions intact bilaterally V,VII: mild decrease in right NLF, facial light touch sensation decreased on the right forehead VIII: hearing normal bilaterally IX,X: gag reflex present XI: bilateral shoulder shrug XII: midline tongue extension Motor: Right : Upper extremity   5-/5 +drift without pronation   Left:     Upper extremity   5/5  Lower extremity   4-/5  Lower extremity   5-/5 Decreased strength on the right has a great effort component.  Patient does better with strength when moving spontaneously.  Some limitations due to pain   Sensory: Pinprick and light touch decreased on the right splitting the midline Deep Tendon Reflexes: 2+ in the upper extremities and absent in the lower extremities Plantars: Right: mute   Left: mute Cerebellar: Normal finger-to-nose and normal heel-to-shin testing bilaterally Gait: not tested due to safety concerns    Laboratory Studies:   Basic Metabolic Panel:  Recent Labs Lab 10/04/17 2149 10/05/17 0350  NA 139  --   K 4.7  --   CL 107  --   CO2 24  --   GLUCOSE 115*  --   BUN 18  --   CREATININE 1.54* 1.21*  CALCIUM 8.5*  --     Liver Function Tests:  Recent Labs Lab 10/04/17 2149  AST 21  ALT 14  ALKPHOS 152*  BILITOT 0.7  PROT 6.6  ALBUMIN 3.1*   No results for input(s): LIPASE, AMYLASE in the last 168 hours. No results for input(s):  AMMONIA in the last 168 hours.  CBC:  Recent Labs Lab 10/04/17 2149 10/05/17 0350  WBC 8.0 6.2  NEUTROABS 4.2  --   HGB 9.1* 8.6*  HCT 28.0* 26.0*  MCV 80.6 80.2  PLT 287 259    Cardiac Enzymes:  Recent Labs Lab 10/04/17 2149  TROPONINI <0.03    BNP: Invalid input(s): POCBNP  CBG:  Recent Labs Lab 10/04/17 2149  GLUCAP 120*    Microbiology: Results for orders placed or performed during the hospital encounter of 08/26/17  C difficile quick scan w PCR reflex     Status: None   Collection Time: 08/26/17  8:37 PM  Result Value Ref Range Status   C Diff antigen NEGATIVE NEGATIVE Final   C Diff toxin NEGATIVE NEGATIVE Final   C Diff interpretation No C. difficile detected.  Final  Culture, blood (Routine X 2) w Reflex to ID Panel     Status: None   Collection Time: 08/26/17  9:48 PM  Result Value Ref Range Status   Specimen Description BLOOD LEFT HAND  Final   Special Requests   Final    BOTTLES DRAWN AEROBIC AND ANAEROBIC Blood Culture adequate volume   Culture NO GROWTH 5 DAYS  Final   Report Status 08/31/2017 FINAL  Final  Culture, blood (Routine X 2) w Reflex to ID Panel     Status: None   Collection Time: 08/26/17  9:48 PM  Result Value Ref Range Status   Specimen Description BLOOD RAC  Final   Special Requests   Final    BOTTLES DRAWN AEROBIC AND ANAEROBIC Blood Culture results may not be optimal due to an excessive volume of blood received in culture bottles   Culture NO GROWTH 5 DAYS  Final   Report Status 08/31/2017 FINAL  Final  MRSA PCR Screening     Status: None   Collection Time: 08/28/17 11:12 AM  Result Value Ref Range Status   MRSA by PCR NEGATIVE NEGATIVE Final    Comment:        The GeneXpert MRSA Assay (FDA approved for NASAL specimens only), is one component of a comprehensive MRSA colonization surveillance program. It is not intended to diagnose MRSA infection nor to guide or monitor treatment for MRSA infections.   Culture,  respiratory (NON-Expectorated)     Status: Abnormal   Collection Time: 08/29/17 10:46 PM  Result Value Ref Range Status   Specimen Description SPUTUM  Final   Special Requests NONE  Final   Gram Stain   Final    RARE WBC PRESENT,BOTH PMN AND MONONUCLEAR NO SQUAMOUS EPITHELIAL CELLS SEEN NO ORGANISMS SEEN Performed at [facility], [address], [place], [place] [address]    Culture MULTIPLE ORGANISMS PRESENT, NONE PREDOMINANT (A)  Final   Report Status 09/01/2017 FINAL  Final    Coagulation Studies:  Recent Labs  10/04/17 2149  LABPROT 12.6  INR 0.95    Urinalysis: No results for input(s): COLORURINE, LABSPEC, PHURINE, GLUCOSEU, HGBUR, BILIRUBINUR, KETONESUR, PROTEINUR, UROBILINOGEN, NITRITE, LEUKOCYTESUR in the last 168 hours.  Invalid input(s): APPERANCEUR  Lipid Panel:     Component Value Date/Time   CHOL 195 10/05/2017 0350   TRIG 169 (H) 10/05/2017 0350   HDL 45 10/05/2017 0350   CHOLHDL 4.3 10/05/2017 0350   VLDL 34 10/05/2017 0350   LDLCALC 116 (H) 10/05/2017 0350    HgbA1C: No results found for: HGBA1C  Urine Drug Screen:  No results found for: LABOPIA, COCAINSCRNUR, LABBENZ, AMPHETMU, THCU, LABBARB  Alcohol Level: No results for input(s): ETH in the last 168 hours.  Other results: EKG: normal sinus rhythm at 74 bpm.  Imaging: Mr Brain Wo Contrast  Result Date: 10/05/2017 CLINICAL DATA:  RIGHT-sided weakness.  Patient woke up and fell. EXAM: MRI HEAD WITHOUT CONTRAST MRA HEAD WITHOUT CONTRAST TECHNIQUE: Multiplanar, multiecho pulse sequences of the brain and surrounding structures were obtained without intravenous contrast. Angiographic images of the head were obtained using MRA technique without contrast. COMPARISON:  MR brain 09/05/2017. FINDINGS: MRI HEAD FINDINGS Brain: No evidence for acute infarction, hemorrhage, mass lesion, hydrocephalus, or extra-axial fluid. Normal for age cerebral volume. Minor white matter disease likely chronic  microvascular ischemic change from hypertension and/or hyperlipidemia. Chronic LEFT thalamic lacunar stroke. Vascular: Normal flow voids. Skull and upper cervical spine: Normal marrow signal. Sinuses/Orbits: Negative. Other: None. MRA HEAD FINDINGS The internal carotid arteries are widely patent. The basilar artery is widely patent with vertebrals codominant. There is no proximal stenosis of the anterior, middle, or posterior cerebral arteries. No cerebellar branch occlusion. No intracranial saccular aneurysm. IMPRESSION: Unremarkable MRI brain. No acute stroke or interval change from priors. Minor white matter disease. Unremarkable MRA of the intracranial circulation. Electronically Signed   By: Staci Righter M.D.   On: 10/05/2017 09:41   US Carotid Bilateral (at [facility] Only)  Result Date: 10/05/2017 CLINICAL DATA:  Right-sided weakness, hypertension, syncope EXAM: BILATERAL CAROTID DUPLEX ULTRASOUND TECHNIQUE: Pearline Cables scale imaging, color Doppler and duplex ultrasound were performed of bilateral carotid and vertebral arteries in the neck. COMPARISON:  10/05/2017 MRI FINDINGS: Criteria: Quantification of carotid stenosis is based on velocity parameters that correlate the residual internal carotid diameter with NASCET-based stenosis levels, using the diameter of the distal internal carotid lumen as the denominator for stenosis measurement. The following velocity measurements were obtained: RIGHT ICA:  150/54 cm/sec CCA:  78/93 cm/sec SYSTOLIC ICA/CCA RATIO:  1.9 DIASTOLIC ICA/CCA RATIO:  2.3 ECA:  99 cm/sec LEFT ICA:  121/46 cm/sec CCA:  810/17 cm/sec SYSTOLIC ICA/CCA RATIO:  1.1 DIASTOLIC ICA/CCA RATIO:  1.8 ECA:  76 cm/sec RIGHT CAROTID ARTERY: Minor echogenic shadowing plaque formation. No hemodynamically significant right ICA stenosis, velocity elevation, or turbulent flow. Degree of narrowing less than 50%. RIGHT VERTEBRAL ARTERY:  Antegrade LEFT CAROTID ARTERY: Similar scattered minor echogenic plaque  formation. No hemodynamically significant left ICA stenosis, velocity elevation, or  turbulent flow. LEFT VERTEBRAL ARTERY:  Antegrade IMPRESSION: Moderate bilateral carotid atherosclerosis. No hemodynamically significant ICA stenosis. Degree of narrowing less than 50% bilaterally by ultrasound criteria. Patent antegrade vertebral flow bilaterally Electronically Signed   By: Jerilynn Mages.  Shick M.D.   On: 10/05/2017 10:31   Mr Jodene Nam Head/brain BD Cm  Result Date: 10/05/2017 CLINICAL DATA:  RIGHT-sided weakness.  Patient woke up and fell. EXAM: MRI HEAD WITHOUT CONTRAST MRA HEAD WITHOUT CONTRAST TECHNIQUE: Multiplanar, multiecho pulse sequences of the brain and surrounding structures were obtained without intravenous contrast. Angiographic images of the head were obtained using MRA technique without contrast. COMPARISON:  MR brain 09/05/2017. FINDINGS: MRI HEAD FINDINGS Brain: No evidence for acute infarction, hemorrhage, mass lesion, hydrocephalus, or extra-axial fluid. Normal for age cerebral volume. Minor white matter disease likely chronic microvascular ischemic change from hypertension and/or hyperlipidemia. Chronic LEFT thalamic lacunar stroke. Vascular: Normal flow voids. Skull and upper cervical spine: Normal marrow signal. Sinuses/Orbits: Negative. Other: None. MRA HEAD FINDINGS The internal carotid arteries are widely patent. The basilar artery is widely patent with vertebrals codominant. There is no proximal stenosis of the anterior, middle, or posterior cerebral arteries. No cerebellar branch occlusion. No intracranial saccular aneurysm. IMPRESSION: Unremarkable MRI brain. No acute stroke or interval change from priors. Minor white matter disease. Unremarkable MRA of the intracranial circulation. Electronically Signed   By: Staci Righter M.D.   On: 10/05/2017 09:41   Ct Head Code Stroke Wo Contrast  Result Date: 10/04/2017 CLINICAL DATA:  Code stroke.  Right-sided weakness EXAM: CT HEAD WITHOUT CONTRAST  TECHNIQUE: Contiguous axial images were obtained from the base of the skull through the vertex without intravenous contrast. COMPARISON:  Brain MRI 09/05/2017 FINDINGS: Brain: No mass lesion or acute hemorrhage. No focal hypoattenuation of the basal ganglia or cortex to indicate infarcted tissue. No hydrocephalus or age advanced atrophy. Vascular: No hyperdense vessel. No advanced atherosclerotic calcification of the arteries at the skull base. Skull: Normal visualized skull base, calvarium and extracranial soft tissues. Sinuses/Orbits: No sinus fluid levels or advanced mucosal thickening. No mastoid effusion. Normal orbits. ASPECTS [facility] Stroke Program Early CT Score) - Ganglionic level infarction (caudate, lentiform nuclei, internal capsule, insula, M1-M3 cortex): 7 - Supraganglionic infarction (M4-M6 cortex): 3 Total score (0-10 with 10 being normal): 10 IMPRESSION: 1. Normal head CT 2. ASPECTS is 10. These results were called by telephone at the time of interpretation on 10/04/2017 at 9:55 pm to Dr. Lavonia Drafts , who verbally acknowledged these results. Electronically Signed   By: Ulyses Jarred M.D.   On: 10/04/2017 21:55     Assessment/Plan: 66 year old female presenting with right sided hemiparesis.  MRI of the brain reviewed and shows no acute changes.  Neurological examination limited by effort.  Patient describes a lot of pain, particularly at the right neck.  Further work up recommended.    Recommendations: 1.  MRI of the cervical spine 2.  PT/OT evaluation 3.  If MRI unremarkable patient to continue follow up with neurology on an outpatient basis.    Alexis Goodell, MD Neurology [phone] 10/05/2017, 2:06 PM

## 2017-10-05 NOTE — Progress Notes (Signed)
PT Cancellation Note  Patient Details Name: Morgan [name] MRN: [id number] DOB: [birth year]   Cancelled Treatment:    Reason Eval/Treat Not Completed: Patient at procedure or test/unavailable (Pt currently off floor for cervical MRI).  This is the second attempt to see pt today.  Earlier today, pt was unavailable as she was off the floor for a brain MRI.  Will continue to follow pt acutely and will attempt to see again today, schedule permitting.   Collie Siad PT, DPT 10/05/2017, 2:48 PM

## 2017-10-05 NOTE — Evaluation (Signed)
Occupational Therapy Evaluation Patient Details Name: Morgan [name] MRN: [id number] DOB: February 12, 1951 Today's Date: 10/05/2017    History of Present Illness 66yo female pt admitted 10/19 with R sided weakness. 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 MRI and CT show no acute infarct.   Clinical Impression   Pt seen for OT evaluation this date. Pt reports having been home 1 day from STR following recent admission where she was on a ventilator and then ICU for multiple days prior to STR before being admitted this time with R sided weakness resulting in a fall in the sandbox as she was playing with 3yo grandson outside. Per chart review, prior to recent admission approx 3 weeks ago, pt was indep with mobility, ADL, iADL including driving and working at [employer]. Since getting back home from STR, pt ambulating w/ 2WW, getting assist from roommate with shower. 1 fall in past 12 months leading to this admission.   Pt presents with generalized weakness, pt reporting decreased sensation in RUE/RLE and forehead, 8/10 pain in R side of neck and RUE with light touch and active movement, inconsistent RUE MMT, and blurry vision (worse in R eye than L). Finger to nose testing grossly normal. RUE mild pronator drift noted. Supervision required for bed mobility and min guard for safety for sit to stand transfers EOB with RW as pt reporting mild dizziness. VSS throughout session in lying and sitting after ambulating approx 10 feet. BP 142/72, 137/75. Pt noting fatigue. No LOB noted during ambulation. Good bilateral UE use on RW. All testing negative thus far for acute infarct. MD has ordered a MRI of cervical spine which is pending.   Pt would benefit from skilled OT services to address deficits in strength, balance, and activity tolerance in order to maximize return to PLOF and minimize risk of falls, injury, and readmission. Recommend HHOT upon  discharge.    Follow Up Recommendations  Home health OT    Equipment Recommendations  None recommended by OT    Recommendations for Other Services       Precautions / Restrictions Precautions Precautions: Fall Restrictions Weight Bearing Restrictions: No      Mobility Bed Mobility Overal bed mobility: Needs Assistance Bed Mobility: Supine to Sit;Sit to Supine     Supine to sit: Modified independent (Device/Increase time) Sit to supine: Modified independent (Device/Increase time)   General bed mobility comments: no assist required, no difficulty noted  Transfers Overall transfer level: Needs assistance Equipment used: Rolling walker (2 wheeled) Transfers: Sit to/from Stand Sit to Stand: Supervision;Min guard         General transfer comment: min guard for safety due to pt's report of light dizziness, no LOB noted    Balance Overall balance assessment: Needs assistance Sitting-balance support: Single extremity supported;Feet supported Sitting balance-Leahy Scale: Good     Standing balance support: Bilateral upper extremity supported Standing balance-Leahy Scale: Good                             ADL either performed or assessed with clinical judgement   ADL Overall ADL's : Needs assistance/impaired;At baseline             Lower Body Bathing: Sitting/lateral leans;Sit to/from stand;Minimal assistance;Moderate assistance   Upper Body Dressing : Sitting;Set up   Lower Body Dressing: Sitting/lateral leans;Sit to/from stand;Minimal assistance   Toilet Transfer: Ambulation;RW;Min guard;Comfort height toilet  Functional mobility during ADLs: Min guard;Supervision/safety;Rolling walker General ADL Comments: grossly at baseline assist level for ADL tasks, min guard for initial transfer EOB and ambulating with RW in room due to pt's reported dizziness, with no LOB noted with mobility.     Vision Baseline Vision/History: Wears glasses  (wears contacts) Wears Glasses: At all times Patient Visual Report: Blurring of vision (R worse than L per pt report) Vision Assessment?: No apparent visual deficits Additional Comments: closed R eye during testing to help with "focus"      Perception     Praxis      Pertinent Vitals/Pain Pain Assessment: 0-10 Pain Score: 8  Pain Location: R side of neck, RUE to light touch and with active movement Pain Descriptors / Indicators: Aching Pain Intervention(s): Limited activity within patient's tolerance;Monitored during session;RN gave pain meds during session     Hand Dominance Left   Extremity/Trunk Assessment Upper Extremity Assessment Upper Extremity Assessment: RUE deficits/detail;LUE deficits/detail RUE Deficits / Details: grossly 4-/5 with inconsistent give away in strength; reports decreased sensation, mild pronator drift noted with mild tremor noted RUE Sensation: decreased light touch LUE Deficits / Details: grossly 4-/5, intact sensation   Lower Extremity Assessment Lower Extremity Assessment: Generalized weakness;RLE deficits/detail (grossly 4/5 bilaterally) RLE Deficits / Details: pt reports decreased sensation in RLE RLE Sensation: decreased light touch   Cervical / Trunk Assessment Cervical / Trunk Assessment: Normal   Communication Communication Communication: No difficulties   Cognition Arousal/Alertness: Awake/alert Behavior During Therapy: WFL for tasks assessed/performed Overall Cognitive Status: Within Functional Limits for tasks assessed                                     General Comments       Exercises     Shoulder Instructions      Home Living Family/patient expects to be discharged to:: Private residence Living Arrangements: Non-relatives/Friends;Other relatives (lives w/ roommate, roommate's husband, and now 30yo grandson) Available Help at Discharge: Family;Available 24 hours/day Type of Home: House Home Access: Stairs to  enter;Ramped entrance Entrance Stairs-Number of Steps: 6 steps in front; ramp in back of house   Home Layout: One level     Bathroom Shower/Tub: Occupational psychologist: Standard     Home Equipment: Shower seat - built in;Grab bars - tub/shower;Walker - 2 wheels          Prior Functioning/Environment Level of Independence: Needs assistance        Comments: prior to recent admission approx 3 weeks ago, pt was indep with mobility, ADL, iADL including driving and working at [employer]. Since getting back home from STR, pt amb w/ 2WW, getting assist from roommate with shower. Fell right before this admission.        OT Problem List: Decreased strength;Pain;Decreased activity tolerance      OT Treatment/Interventions: Self-care/ADL training;DME and/or AE instruction;Patient/family education;Therapeutic exercise;Therapeutic activities    OT Goals(Current goals can be found in the care plan section) Acute Rehab OT Goals Patient Stated Goal: get better OT Goal Formulation: With patient Time For Goal Achievement: 10/19/17 Potential to Achieve Goals: Good  OT Frequency: Min 1X/week   Barriers to D/C:            Co-evaluation              AM-PAC PT "6 Clicks" Daily Activity     Outcome Measure Help from another person  eating meals?: None Help from another person taking care of personal grooming?: None Help from another person toileting, which includes using toliet, bedpan, or urinal?: A Little Help from another person bathing (including washing, rinsing, drying)?: A Little Help from another person to put on and taking off regular upper body clothing?: None Help from another person to put on and taking off regular lower body clothing?: A Little 6 Click Score: 21   End of Session Equipment Utilized During Treatment: Gait belt;Rolling walker  Activity Tolerance: Patient tolerated treatment well Patient left: in bed;with call bell/phone within reach;with bed alarm  set  OT Visit Diagnosis: Other abnormalities of gait and mobility (R26.89);Muscle weakness (generalized) (M62.81)                Time: 1791-5056 OT Time Calculation (min): 41 min Charges:  OT General Charges $OT Visit: 1 Visit OT Evaluation $OT Eval Low Complexity: 1 Low OT Treatments $Self Care/Home Management : 8-22 mins G-Codes: OT G-codes **NOT FOR INPATIENT CLASS** Functional Assessment Tool Used: AM-PAC 6 Clicks Daily Activity;Clinical judgement Functional Limitation: Self care Self Care Current Status (P7948): At least 20 percent but less than 40 percent impaired, limited or restricted Self Care Goal Status (A1655): At least 1 percent but less than 20 percent impaired, limited or restricted   Jeni Salles, MPH, MS, OTR/L ascom [phone] 10/05/17, 1:23 PM

## 2017-10-05 NOTE — Evaluation (Signed)
Physical Therapy Evaluation Patient Details Name: Morgan [name] MRN: [id number] DOB: 1951/06/11 Today's Date: 10/05/2017   History of Present Illness  66yo female pt admitted 10/19 with R sided weakness. 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 MRI and CT show no acute infarct. Cervical MRI confirms multilevel spondylosis involving both disc and facet pathology; potentially symptomatic foraminal narrowing at C3-4 through C6-7.  Clinical Impression  Prior to recent hospital admission (08/26/17-09/12/17), pt was independent with all ADLs, working part time.  Pt lives with roommate in a single story home with a ramped entrance.  Currently pt is min guard for bed mobility, transfers and gait with RW (7f with chair follow) for safety; generalized strength, range of motion and balance deficits R>L. Some inconsistencies noted throughout session with respect to specifically testing strength/range of motion versus functional strength and mobility. Pt did note dizziness upon standing and sitting, resolved within 1 minute.  Pt would benefit from skilled PT to address noted impairments and functional limitations (see below for any additional details).  Upon hospital discharge, [place] [facility]     Follow Up Recommendations Home health PT    Equipment Recommendations  Rolling walker with 5" wheels (Pt has PT recommended DME)    Recommendations for Other Services       Precautions / Restrictions Precautions Precautions: Fall Restrictions Weight Bearing Restrictions: No      Mobility  Bed Mobility Overal bed mobility: Needs Assistance Bed Mobility: Supine to Sit     Supine to sit: Modified independent (Device/Increase time);HOB elevated Sit to supine: Modified independent (Device/Increase time)   General bed mobility comments: no assist or difficulties noted  Transfers Overall transfer level: Needs  assistance Equipment used: Rolling walker (2 wheeled) Transfers: Sit to/from Stand Sit to Stand: Min guard         General transfer comment: min guard for safety d/t pt's report of dizziness  Ambulation/Gait Ambulation/Gait assistance: Min guard Ambulation Distance (Feet): 40 Feet Assistive device: Rolling walker (2 wheeled) Gait Pattern/deviations: WFL(Within Functional Limits)     General Gait Details: decreased gait speed; short steps with minimal foot clearance bilaterally secondary to decreased DF bilaterally; slight trunk flexion  Stairs            Wheelchair Mobility    Modified Rankin (Stroke Patients Only)       Balance Overall balance assessment: Needs assistance Sitting-balance support: Feet supported Sitting balance-Leahy Scale: Good Sitting balance - Comments: able to reach outside of BOS, perform LE MMT testing without LOB   Standing balance support: Bilateral upper extremity supported;During functional activity Standing balance-Leahy Scale: Fair Standing balance comment: BUE support on RW during gait, able to stand with UUE support on RW in standing while reaching slightly forward to open door                             Pertinent Vitals/Pain Pain Assessment: 0-10 Pain Score: 7  Pain Location: R side of neck Pain Descriptors / Indicators: Aching Pain Intervention(s): Limited activity within patient's tolerance;Monitored during session    Home Living Family/patient expects to be discharged to:: Private residence Living Arrangements: Non-relatives/Friends Available Help at Discharge: Family;Friend(s);Available 24 hours/day Type of Home: House Home Access: Ramped entrance   Entrance Stairs-Number of Steps: 6 steps in front; ramp in back of house Home Layout: One level Home Equipment: WVictor- 2 wheels;Shower seat;Grab bars -  toilet;Grab bars - tub/shower      Prior Function Level of Independence: Needs assistance          Comments: Some inconsistencies noted between PT and OT evaluations about PLOF and home layout. 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 Since getting back home from STR, pt amb w/ 2WW, getting assist from roommate with shower. Fell right before this admission. Pt reports feeling dizzy 5-6x/wk upon standing, resolves in 2-3 minutes.     Hand Dominance   Dominant Hand: Left    Extremity/Trunk Assessment   Upper Extremity Assessment Upper Extremity Assessment: RUE deficits/detail;LUE deficits/detail RUE Deficits / Details: shoulder flexion 2/5, elbow flexion 3/5, elbow extension 3/5, grip 3/5; tremor noted with BUE elevation; functional shoulder flexion to 150 degrees RUE Sensation: decreased light touch LUE Deficits / Details: shoulder flexion 2/5, elbow flexion 4-/5, elbow extension 4-/5, grip 4-/5; tremor noted with BUE elevation; functional shoulder flexion to 150 degrees    Lower Extremity Assessment Lower Extremity Assessment: RLE deficits/detail;LLE deficits/detail RLE Deficits / Details: hip flexion 3/5, knee extension 4-/5, knee flexion 4-/5, DF/PF WFL; unable to test tone d/t patient resisting; inconsistencies noted with R foot sensation, pt reports "I'm not sure" when testing R foot sensation; proprioception intact RLE Sensation: decreased light touch LLE Deficits / Details: hip flexion 3/5; knee extension/flexion, DF/PF WFL; unable to test tone d/t patient resisting; sensation intact, proprioception intact    Cervical / Trunk Assessment Cervical / Trunk Assessment: Normal  Communication   Communication: No difficulties  Cognition Arousal/Alertness: Awake/alert Behavior During Therapy: WFL for tasks assessed/performed Overall Cognitive Status: Within Functional Limits for tasks assessed                                        General Comments       Exercises     Assessment/Plan    PT Assessment Patient needs continued PT services  PT Problem List Decreased strength;Decreased range of motion;Decreased activity tolerance;Decreased balance;Decreased mobility;Pain       PT Treatment Interventions DME instruction;Gait training;Functional mobility training;Therapeutic activities;Therapeutic exercise;Balance training;Patient/family education    PT Goals (Current goals can be found in the Care Plan section)  Acute Rehab PT Goals Patient Stated Goal: to go home PT Goal Formulation: With patient Time For Goal Achievement: 10/19/17 Potential to Achieve Goals: Good    Frequency Min 2X/week   Barriers to discharge        Co-evaluation               AM-PAC PT "6 Clicks" Daily Activity  Outcome Measure Difficulty turning over in bed (including adjusting bedclothes, sheets and blankets)?: None Difficulty moving from lying on back to sitting on the side of the bed? : None Difficulty sitting down on and standing up from a chair with arms (e.g., wheelchair, bedside commode, etc,.)?: A Little Help needed moving to and from a bed to chair (including a wheelchair)?: A Little Help needed walking in hospital room?: A Little Help needed climbing 3-5 steps with a railing? : A Little 6 Click Score: 20    End of Session Equipment Utilized During Treatment: Gait belt Activity Tolerance: Patient tolerated treatment well Patient left: in chair;with call bell/phone within reach;with chair alarm set Nurse Communication: Mobility status PT Visit Diagnosis: Unsteadiness on  feet (R26.81);Muscle weakness (generalized) (M62.81);Dizziness and giddiness (R42)    Time: 2429-9806 PT Time Calculation (min) (ACUTE ONLY): 35 min   Charges:         PT G CodesWetzel Bjornstad, SPT 10/05/2017, 4:43 PM

## 2017-10-05 NOTE — Evaluation (Signed)
Clinical/Bedside Swallow Evaluation Patient Details  Name: Morgan [name] MRN: [id number] Date of Birth: January 07, 1951  Today's Date: 10/05/2017 Time: SLP Start Time (ACUTE ONLY): 1015 SLP Stop Time (ACUTE ONLY): 1115 SLP Time Calculation (min) (ACUTE ONLY): 60 min  Past Medical History:  Past Medical History:  Diagnosis Date  . Allergy   . Anemia   . Anxiety   . Arthritis   . Bipolar 1 disorder ([place])   . Depression   . GERD (gastroesophageal reflux disease)   . Hyperlipidemia   . Hypertension   . MVP (mitral valve prolapse)    Past Surgical History:  Past Surgical History:  Procedure Laterality Date  . ABDOMINAL HYSTERECTOMY    . APPENDECTOMY    . BREAST SURGERY Right 1988   tumor removal  . KNEE SURGERY  2005   2 times left  . TUBAL LIGATION     HPI:  Pt is a 66 y.o. female who presents with multiple medical issues including Bipolar Dis., anxiety, depression, GERD who had a recent hospitalization w/ oral intubation/extubation and was just discharged from Rehab, per pt/NSG report. She c/o right-sided weakness stating that she woke up and fell due to her right-sided weakness. She waited some time to see if it would improve, and when it didn't get better she called her home health nurse who told her to come straight to the hospital. Currently, verbally conversive and following all instructions.    Assessment / Plan / Recommendation Clinical Impression  Pt appears to present w/ adequate oropharyngeal phase swallow function w/ reduced risk for aspiration when following general aspiration precautions. Pt consumed po trials w/ no overt s/s of aspiration noted, no decline in vocal quality or respiratory status. Oral phase was wfl for bolus management and clearing. Pt fed self but stated it was difficult for her to cut foods at times d/t UE shakiness. She avoids particulate foods. Recommend a mech soft diet w/ thin liquids; general aspiration precautions and Pills w/ a puree for easier  swallowing. NSG to reconsult if any deciline in status while admitted. Of note, pt denies any change in cognitive-linguistic status; no deficits w/ her speech.  SLP Visit Diagnosis: Dysphagia, unspecified (R13.10)    Aspiration Risk   (reduced )    Diet Recommendation  mech soft diet w/ thin liquids; general aspiration and Reflux precautions  Medication Administration: Whole meds with puree    Other  Recommendations Recommended Consults:  (dietician f/u as needed) Oral Care Recommendations: Oral care BID;Patient independent with oral care;Staff/trained caregiver to provide oral care Other Recommendations:  (n/a)   Follow up Recommendations None      Frequency and Duration     (n/a)       Prognosis Prognosis for Safe Diet Advancement: Good      Swallow Study   General Date of Onset: 10/04/17 HPI: Pt is a 66 y.o. female who presents with multiple medical issues including Bipolar Dis., anxiety, depression, GERD who had a recent hospitalization w/ oral intubation/extubation and was just discharged from Rehab, per pt/NSG report. She c/o right-sided weakness stating that she woke up and fell due to her right-sided weakness. She waited some time to see if it would improve, and when it didn't get better she called her home health nurse who told her to come straight to the hospital. Currently, verbally conversive and following all instructions.  Type of Study: Bedside Swallow Evaluation Previous Swallow Assessment: during previous hospitalization per report Diet Prior to this Study: Regular;Thin liquids (  per pt) Temperature Spikes Noted: No (wbc not elevated) Respiratory Status: Room air History of Recent Intubation: No Behavior/Cognition: Alert;Cooperative;Pleasant mood Oral Cavity Assessment: Within Functional Limits;Dry Oral Care Completed by SLP: Recent completion by staff Oral Cavity - Dentition: Dentures, top;Dentures, bottom Vision: Functional for self-feeding Self-Feeding  Abilities: Able to feed self;Needs set up (min ) Patient Positioning: Upright in bed Baseline Vocal Quality: Normal Volitional Cough: Strong Volitional Swallow: Able to elicit    Oral/Motor/Sensory Function Overall Oral Motor/Sensory Function: Within functional limits   Ice Chips Ice chips: Within functional limits Presentation: Spoon (fed; 3 trials)   Thin Liquid Thin Liquid: Within functional limits Presentation: Cup;Self Fed;Straw (~4 ozs total)    Nectar Thick Nectar Thick Liquid: Not tested   Honey Thick Honey Thick Liquid: Not tested   Puree Puree: Within functional limits Presentation: Self Fed;Spoon (5 trials)   Solid   GO   Solid: Within functional limits (mech soft foods) Presentation: Self Fed (3 trials) Other Comments: stated particulate foods were difficult for her to eat    Functional Assessment Tool Used: clinicial judgement Functional Limitations: Swallowing Swallow Current Status (K8638): At least 1 percent but less than 20 percent impaired, limited or restricted Swallow Goal Status [phone]): At least 1 percent but less than 20 percent impaired, limited or restricted Swallow Discharge Status [id number]): At least 1 percent but less than 20 percent impaired, limited or restricted     Orinda Kenner, MS, CCC-SLP Teva Bronkema 10/05/2017,11:23 AM

## 2017-10-05 NOTE — Progress Notes (Signed)
[place] at [facility]                                                                                                                                                                                  Patient Demographics   Morgan [name], is a 66 y.o. female, DOB - [birth year], QZE:[id number]  Admit date - 10/04/2017   Admitting Physician Lance Coon, MD  Outpatient Primary MD for the patient is [name], [name], MD   LOS - 0  Subjective: Patient recently discharged after ICU admission requiring intubation At that time patient had some neurological symptoms and had MRI and neurology follow-up which was negative. Now she is admitted with right sided weakness continues to complain of right-sided weakness complains of neck pain on the side.   Review of Systems:   CONSTITUTIONAL: No documented fever. No fatigue, weakness. No weight gain, no weight loss.  EYES: No blurry or double vision.  ENT: No tinnitus. No postnasal drip. No redness of the oropharynx.  RESPIRATORY: No cough, no wheeze, no hemoptysis. No dyspnea.  CARDIOVASCULAR: No chest pain. No orthopnea. No palpitations. No syncope.  GASTROINTESTINAL: No nausea, no vomiting or diarrhea. No abdominal pain. No melena or hematochezia.  GENITOURINARY: No dysuria or hematuria.  ENDOCRINE: No polyuria or nocturia. No heat or cold intolerance.  HEMATOLOGY: No anemia. No bruising. No bleeding.  INTEGUMENTARY: No rashes. No lesions.  MUSCULOSKELETAL: No arthritis. No swelling. No gout.  Complaints of neck pain  NEUROLOGIC: complains of right sided weakness PSYCHIATRIC: No anxiety. No insomnia. No ADD.    Vitals:   Vitals:   10/05/17 0557 10/05/17 0759 10/05/17 1015 10/05/17 1138  BP: 115/60 124/64 (!) 141/80 127/66  Pulse: 69 70 72 61  Resp: 18 18 18 18   Temp: 97.9 F (36.6 C) 97.8 F (36.6 C) 98.3 F (36.8 C) 97.9 F (36.6 C)  TempSrc: Oral Oral Oral Oral  SpO2: 97% 97% 99% 100%  Weight:       Height:        Wt Readings from Last 3 Encounters:  10/05/17 205 lb 6.4 oz (93.2 kg)  09/12/17 190 lb 4.8 oz (86.3 kg)  05/15/16 201 lb 12.8 oz (91.5 kg)    No intake or output data in the 24 hours ending 10/05/17 1520  Physical Exam:   GENERAL: Pleasant-appearing in no apparent distress.  HEAD, EYES, EARS, NOSE AND THROAT: Atraumatic, normocephalic. Extraocular muscles are intact. Pupils equal and reactive to light. Sclerae anicteric. No conjunctival injection. No oro-pharyngeal erythema.  NECK: Supple. There is no jugular venous distention. No bruits, no lymphadenopathy, no thyromegaly.  HEART: Regular rate and rhythm,. No murmurs, no rubs,  no clicks.  LUNGS: Clear to auscultation bilaterally. No rales or rhonchi. No wheezes.  ABDOMEN: Soft, flat, nontender, nondistended. Has good bowel sounds. No hepatosplenomegaly appreciated.  EXTREMITIES: No evidence of any cyanosis, clubbing, or peripheral edema.  +2 pedal and radial pulses bilaterally.  NEUROLOGIC: The patient is alert, awake, and oriented x3 with no focal motor or sensory deficits appreciated bilaterally.  SKIN: Moist and warm with no rashes appreciated.  Psych: Not anxious, depressed LN: No inguinal LN enlargement    Antibiotics   Anti-infectives    None      Medications   Scheduled Meds: . ARIPiprazole  15 mg Oral Daily  . aspirin EC  325 mg Oral Daily  . enoxaparin (LOVENOX) injection  40 mg Subcutaneous Q24H  . FLUoxetine  20 mg Oral Daily  . pantoprazole  40 mg Oral Daily   Continuous Infusions: PRN Meds:.acetaminophen **OR** acetaminophen (TYLENOL) oral liquid 160 mg/5 mL **OR** acetaminophen, hydrOXYzine, hydrOXYzine   Data Review:   Micro Results No results found for this or any previous visit (from the past 240 hour(s)).  Radiology Reports Mr Brain Wo Contrast  Result Date: 10/05/2017 CLINICAL DATA:  RIGHT-sided weakness.  Patient woke up and fell. EXAM: MRI HEAD WITHOUT CONTRAST MRA HEAD  WITHOUT CONTRAST TECHNIQUE: Multiplanar, multiecho pulse sequences of the brain and surrounding structures were obtained without intravenous contrast. Angiographic images of the head were obtained using MRA technique without contrast. COMPARISON:  MR brain 09/05/2017. FINDINGS: MRI HEAD FINDINGS Brain: No evidence for acute infarction, hemorrhage, mass lesion, hydrocephalus, or extra-axial fluid. Normal for age cerebral volume. Minor white matter disease likely chronic microvascular ischemic change from hypertension and/or hyperlipidemia. Chronic LEFT thalamic lacunar stroke. Vascular: Normal flow voids. Skull and upper cervical spine: Normal marrow signal. Sinuses/Orbits: Negative. Other: None. MRA HEAD FINDINGS The internal carotid arteries are widely patent. The basilar artery is widely patent with vertebrals codominant. There is no proximal stenosis of the anterior, middle, or posterior cerebral arteries. No cerebellar branch occlusion. No intracranial saccular aneurysm. IMPRESSION: Unremarkable MRI brain. No acute stroke or interval change from priors. Minor white matter disease. Unremarkable MRA of the intracranial circulation. Electronically Signed   By: Staci Righter M.D.   On: 10/05/2017 09:41   Mr Brain Wo Contrast  Result Date: 09/05/2017 CLINICAL DATA:  Initial evaluation for acute altered mental status, unresponsiveness. EXAM: MRI HEAD WITHOUT CONTRAST TECHNIQUE: Multiplanar, multiecho pulse sequences of the brain and surrounding structures were obtained without intravenous contrast. COMPARISON:  Prior CT from 09/04/2017. FINDINGS: Brain: Study degraded by motion. Cerebral volume within normal limits for age. No significant cerebral white matter changes identified. No abnormal foci of restricted diffusion to suggest acute or subacute ischemia. Gray-white matter differentiation maintained. Tiny remote lacunar infarct noted within the left thalamus (series 10, image 26). No other evidence for chronic  infarction. No susceptibility artifact to suggest acute or chronic intracranial hemorrhage. No mass lesion, midline shift or mass effect. No hydrocephalus. No extra-axial fluid collection. Major dural sinuses are patent. Pituitary gland suprasellar region normal. Midline structures intact and normal. Vascular: Major intracranial vascular flow voids well maintained. Skull and upper cervical spine: Craniocervical junction normal. Mild degenerate spondylolysis noted within the upper cervical spine without stenosis. Bone marrow signal intensity normal. No scalp soft tissue abnormality. Sinuses/Orbits: Globes and orbital soft tissues within normal limits. Paranasal sinuses clear. Small bilateral mastoid effusions, of doubtful significance. Inner ear structures normal. Other: None. IMPRESSION: 1. No acute intracranial abnormality. 2. Tiny remote left thalamic lacunar  infarct. 3. Otherwise normal brain MRI. Electronically Signed   By: Jeannine Boga M.D.   On: 09/05/2017 16:47   Mr Cervical Spine Wo Contrast  Result Date: 10/05/2017 CLINICAL DATA:  Neck and BILATERAL arm pain. EXAM: MRI CERVICAL SPINE WITHOUT CONTRAST TECHNIQUE: Multiplanar, multisequence MR imaging of the cervical spine was performed. No intravenous contrast was administered. COMPARISON:  None. FINDINGS: Alignment: Trace anterolisthesis C3-4, trace retrolisthesis C4-5, both facet mediated. Vertebrae: No fracture, evidence of discitis, or bone lesion. Cord: Normal signal and morphology. Posterior Fossa, vertebral arteries, paraspinal tissues: Negative. Disc levels: C2-3:  Normal. C3-4: Trace anterolisthesis. LEFT greater than RIGHT facet arthropathy. Uncinate spurring also asymmetric to the LEFT. LEFT C4 foraminal narrowing. No soft disc protrusion. C4-5: Trace retrolisthesis. BILATERAL facet arthropathy. LEFT uncinate spurring contributes to LEFT C5 foraminal narrowing. C5-6: BILATERAL facet arthropathy. Shallow central protrusion. RIGHT  greater than LEFT C6 foraminal narrowing C6-7: LEFT greater than RIGHT facet arthropathy and uncinate spurring. Mild LEFT C7 foraminal narrowing. C7-T1:  Unremarkable IMPRESSION: Multilevel spondylosis involving both disc and facet pathology. Potentially symptomatic foraminal narrowing at C3-4 through C6-7 as described above. If further investigation desired, cervical myelogram and postmyelogram CT, could provide additional information regarding the relative contributions of each level to the patient's pain syndrome. Electronically Signed   By: Staci Righter M.D.   On: 10/05/2017 15:12   US Carotid Bilateral (at [facility] Only)  Result Date: 10/05/2017 CLINICAL DATA:  Right-sided weakness, hypertension, syncope EXAM: BILATERAL CAROTID DUPLEX ULTRASOUND TECHNIQUE: Pearline Cables scale imaging, color Doppler and duplex ultrasound were performed of bilateral carotid and vertebral arteries in the neck. COMPARISON:  10/05/2017 MRI FINDINGS: Criteria: Quantification of carotid stenosis is based on velocity parameters that correlate the residual internal carotid diameter with NASCET-based stenosis levels, using the diameter of the distal internal carotid lumen as the denominator for stenosis measurement. The following velocity measurements were obtained: RIGHT ICA:  150/54 cm/sec CCA:  40/98 cm/sec SYSTOLIC ICA/CCA RATIO:  1.9 DIASTOLIC ICA/CCA RATIO:  2.3 ECA:  99 cm/sec LEFT ICA:  121/46 cm/sec CCA:  119/14 cm/sec SYSTOLIC ICA/CCA RATIO:  1.1 DIASTOLIC ICA/CCA RATIO:  1.8 ECA:  76 cm/sec RIGHT CAROTID ARTERY: Minor echogenic shadowing plaque formation. No hemodynamically significant right ICA stenosis, velocity elevation, or turbulent flow. Degree of narrowing less than 50%. RIGHT VERTEBRAL ARTERY:  Antegrade LEFT CAROTID ARTERY: Similar scattered minor echogenic plaque formation. No hemodynamically significant left ICA stenosis, velocity elevation, or turbulent flow. LEFT VERTEBRAL ARTERY:  Antegrade IMPRESSION: Moderate  bilateral carotid atherosclerosis. No hemodynamically significant ICA stenosis. Degree of narrowing less than 50% bilaterally by ultrasound criteria. Patent antegrade vertebral flow bilaterally Electronically Signed   By: Jerilynn Mages.  Shick M.D.   On: 10/05/2017 10:31   Dg Chest Port 1 View  Result Date: 09/06/2017 CLINICAL DATA:  Respiratory failure. EXAM: PORTABLE CHEST 1 VIEW COMPARISON:  09/04/2017. FINDINGS: Interim extubation and removal of NG tube. Right PICC line noted with tip over right atrium. Cardiomegaly. Diffuse progressive bilateral from interstitial prominence noted most consistent interstitial edema. Bilateral pneumonia cannot be excluded. Small bilateral pleural effusions. Biapical pleural thickening consistent scarring again noted. No pneumothorax. IMPRESSION: 1. Interim extubation and removal of NG tube. Right PICC line in stable position. 2. Cardiomegaly with diffuse bilateral pulmonary infiltrates, progressed from prior exam. Small bilateral pleural effusions. Findings most consistent with congestive heart failure. Bilateral pneumonia cannot be excluded. Electronically Signed   By: Marcello Moores  Register   On: 09/06/2017 06:18   Mr Jodene Nam Head/brain Wo Cm  Result Date: 10/05/2017 CLINICAL DATA:  RIGHT-sided weakness.  Patient woke up and fell. EXAM: MRI HEAD WITHOUT CONTRAST MRA HEAD WITHOUT CONTRAST TECHNIQUE: Multiplanar, multiecho pulse sequences of the brain and surrounding structures were obtained without intravenous contrast. Angiographic images of the head were obtained using MRA technique without contrast. COMPARISON:  MR brain 09/05/2017. FINDINGS: MRI HEAD FINDINGS Brain: No evidence for acute infarction, hemorrhage, mass lesion, hydrocephalus, or extra-axial fluid. Normal for age cerebral volume. Minor white matter disease likely chronic microvascular ischemic change from hypertension and/or hyperlipidemia. Chronic LEFT thalamic lacunar stroke. Vascular: Normal flow voids. Skull and upper  cervical spine: Normal marrow signal. Sinuses/Orbits: Negative. Other: None. MRA HEAD FINDINGS The internal carotid arteries are widely patent. The basilar artery is widely patent with vertebrals codominant. There is no proximal stenosis of the anterior, middle, or posterior cerebral arteries. No cerebellar branch occlusion. No intracranial saccular aneurysm. IMPRESSION: Unremarkable MRI brain. No acute stroke or interval change from priors. Minor white matter disease. Unremarkable MRA of the intracranial circulation. Electronically Signed   By: Staci Righter M.D.   On: 10/05/2017 09:41   Ct Head Code Stroke Wo Contrast  Result Date: 10/04/2017 CLINICAL DATA:  Code stroke.  Right-sided weakness EXAM: CT HEAD WITHOUT CONTRAST TECHNIQUE: Contiguous axial images were obtained from the base of the skull through the vertex without intravenous contrast. COMPARISON:  Brain MRI 09/05/2017 FINDINGS: Brain: No mass lesion or acute hemorrhage. No focal hypoattenuation of the basal ganglia or cortex to indicate infarcted tissue. No hydrocephalus or age advanced atrophy. Vascular: No hyperdense vessel. No advanced atherosclerotic calcification of the arteries at the skull base. Skull: Normal visualized skull base, calvarium and extracranial soft tissues. Sinuses/Orbits: No sinus fluid levels or advanced mucosal thickening. No mastoid effusion. Normal orbits. ASPECTS [facility] Stroke Program Early CT Score) - Ganglionic level infarction (caudate, lentiform nuclei, internal capsule, insula, M1-M3 cortex): 7 - Supraganglionic infarction (M4-M6 cortex): 3 Total score (0-10 with 10 being normal): 10 IMPRESSION: 1. Normal head CT 2. ASPECTS is 10. These results were called by telephone at the time of interpretation on 10/04/2017 at 9:55 pm to Dr. Lavonia Drafts , who verbally acknowledged these results. Electronically Signed   By: Ulyses Jarred M.D.   On: 10/04/2017 21:55     CBC  Recent Labs Lab 10/04/17 2149 10/05/17 0350   WBC 8.0 6.2  HGB 9.1* 8.6*  HCT 28.0* 26.0*  PLT 287 259  MCV 80.6 80.2  MCH 26.2 26.6  MCHC 32.5 33.2  RDW 16.4* 16.5*  LYMPHSABS 2.8  --   MONOABS 0.6  --   EOSABS 0.4  --   BASOSABS 0.0  --     Chemistries   Recent Labs Lab 10/04/17 2149 10/05/17 0350  NA 139  --   K 4.7  --   CL 107  --   CO2 24  --   GLUCOSE 115*  --   BUN 18  --   CREATININE 1.54* 1.21*  CALCIUM 8.5*  --   AST 21  --   ALT 14  --   ALKPHOS 152*  --   BILITOT 0.7  --    ------------------------------------------------------------------------------------------------------------------ estimated creatinine clearance is 61.2 mL/min (A) (by C-G formula based on SCr of 1.21 mg/dL (H)). ------------------------------------------------------------------------------------------------------------------ No results for input(s): HGBA1C in the last 72 hours. ------------------------------------------------------------------------------------------------------------------  Recent Labs  10/05/17 0350  CHOL 195  HDL 45  LDLCALC 116*  TRIG 169*  CHOLHDL 4.3   ------------------------------------------------------------------------------------------------------------------ No results for input(s): TSH,  T4TOTAL, T3FREE, THYROIDAB in the last 72 hours.  Invalid input(s): FREET3 ------------------------------------------------------------------------------------------------------------------ No results for input(s): VITAMINB12, FOLATE, FERRITIN, TIBC, IRON, RETICCTPCT in the last 72 hours.  Coagulation profile  Recent Labs Lab 10/04/17 2149  INR 0.95    No results for input(s): DDIMER in the last 72 hours.  Cardiac Enzymes  Recent Labs Lab 10/04/17 2149  TROPONINI <0.03   ------------------------------------------------------------------------------------------------------------------ Invalid input(s): POCBNP    Assessment & Plan  Pt is 66 y.o with recent hospitalization for acute  respiratory failure and requiring ICU stay  1. Right sided weakness - symptoms very atypical physical examination consistent, MRI negative for stroke will obtain MRI of the cervical spine PT evaluation and I discussed with the neurologist Start aspirin therapy 2.   Essential hypertension - resume Norvasc 3. Anxiety - resume therapy with ablify  4.   HLD (hyperlipidemia) -  Currently not on any therapy 5.  GERD (gastroesophageal reflux disease) - continue Protonix     Code Status Orders        Start     Ordered   10/05/17 0053  Full code  Continuous     10/05/17 0052    Code Status History    Date Active Date Inactive Code Status Order ID Comments User Context   08/26/2017 10:12 PM 09/12/2017  8:32 PM Full Code [id number]  Lance Coon, MD Inpatient   02/23/2015 12:58 AM 02/24/2015 12:53 PM Full Code [id number]  Ivor Costa, MD Inpatient    Advance Directive Documentation     Most Recent Value  Type of Advance Directive  Healthcare Power of Attorney  Pre-existing out of facility DNR order (yellow form or pink MOST form)  -  "MOST" Form in Place?  -           Consults  neurology   DVT Prophylaxis  Lovenox  Lab Results  Component Value Date   PLT 259 10/05/2017     Time Spent in minutes   42min Greater than 50% of time spent in care coordination and counseling patient regarding the condition and plan of care.   Dustin Flock M.D on 10/05/2017 at 3:20 PM  Between 7am to 6pm - Pager - [phone]  After 6pm go to [email or web address] - password [email or web address] [facility]   Office  [phone]

## 2017-10-05 NOTE — Progress Notes (Signed)
OT Cancellation Note  Patient Details Name: Morgan [name] MRN: [id number] DOB: 12-29-1950   Cancelled Treatment:    Reason Eval/Treat Not Completed: Patient at procedure or test/ unavailable. Order received, chart reviewed. Pt out of room for MRI/MRA. Will re-attempt OT evaluation at later time this date as pt is available.  Jeni Salles, MPH, MS, OTR/L ascom [phone] 10/05/17, 9:19 AM

## 2017-10-06 DIAGNOSIS — R531 Weakness: Secondary | ICD-10-CM | POA: Diagnosis not present

## 2017-10-06 LAB — HEMOGLOBIN A1C
Hgb A1c MFr Bld: 5.8 % — ABNORMAL HIGH (ref 4.8–5.6)
Mean Plasma Glucose: 119.76 mg/dL

## 2017-10-06 MED ORDER — ASPIRIN 325 MG PO TBEC: 325.0000 mg | DELAYED_RELEASE_TABLET | Freq: Every day | ORAL | 0 refills | Status: DC

## 2017-10-06 NOTE — Progress Notes (Signed)
Pt is being discharged home with [facility].  Discharge papers given and explained to pt. Pt verbalized understanding. Meds and f/u appointments reviewed with pt.

## 2017-10-06 NOTE — Discharge Instructions (Signed)
[place] at [place]:  Dysphagia 3 diet with thin liquids  DISCHARGE CONDITION:  Stable  ACTIVITY:  Activity as tolerated with walker  OXYGEN:  Home Oxygen: No.   Oxygen Delivery: room air  DISCHARGE LOCATION:  home    ADDITIONAL DISCHARGE INSTRUCTION:   If you experience worsening of your admission symptoms, develop shortness of breath, life threatening emergency, suicidal or homicidal thoughts you must seek medical attention immediately by calling 911 or calling your MD immediately  if symptoms less severe.  You Must read complete instructions/literature along with all the possible adverse reactions/side effects for all the Medicines you take and that have been prescribed to you. Take any new Medicines after you have completely understood and accpet all the possible adverse reactions/side effects.   Please note  You were cared for by a hospitalist during your hospital stay. If you have any questions about your discharge medications or the care you received while you were in the hospital after you are discharged, you can call the unit and asked to speak with the hospitalist on call if the hospitalist that took care of you is not available. Once you are discharged, your primary care physician will handle any further medical issues. Please note that NO REFILLS for any discharge medications will be authorized once you are discharged, as it is imperative that you return to your primary care physician (or establish a relationship with a primary care physician if you do not have one) for your aftercare needs so that they can reassess your need for medications and monitor your lab values.

## 2017-10-06 NOTE — Care Management Note (Signed)
Case Management Note  Patient Details  Name: AVILENE MARRIN MRN: [id number] Date of Birth: 12/09/1951  Subjective/Objective:     Discussed discharge planning with Mrs Madrigal. She is already open to [facility] and will resume care with [facility]. Cheryl at [facility] was updated.                Action/Plan:   Expected Discharge Date:  10/06/17               Expected Discharge Plan:  [place]  In-House Referral:  NA  Discharge planning Services  CM Consult  Post Acute Care Choice:  Home Health Choice offered to:  Patient  DME Arranged:  N/A DME Agency:  NA  HH Arranged:  PT, Nurse's Aide HH Agency:  [place]  Status of Service:  Completed, signed off  If discussed at [place] of Stay Meetings, dates discussed:    Additional Comments:  Zayyan Mullen A, RN 10/06/2017, 10:40 AM

## 2017-10-06 NOTE — Discharge Summary (Signed)
[facility], [place] y.o., DOB 1951-01-09, MRN [id number]. Admission date: 10/04/2017 Discharge Date 10/06/2017 Primary MD [name], [name], MD Admitting Physician Lance Coon, MD  Admission Diagnosis  Cerebrovascular accident (CVA), unspecified mechanism [facility]) [I63.9]  Discharge Diagnosis   Principal Problem:   Right sided weakness  Etiology unclear negative MRI    Essential hypertension   Anxiety   HLD (hyperlipidemia)   GERD (gastroesophageal reflux disease)    Bipolar 1 disorder   MVP       Hospital Course  Morgan [name]  is a 66 y.o. female who presents with right-sided weakness. Patient states that she woke up and fell due to her right-sided weakness.patient initially was admitted for possible stroke. MRI of the brain was negative. Workup was negative. Physical exam did not match her symptoms. Patient also had a C-spine MRI which did not explain her symptoms. She was seen in consultation by neurology. Patient was recently hospitalized with acute respiratory failure requiring ventilator. Patient's right-sided weakness is improved. She was also already receiving home health which will continue.          Consults  neurology  Significant Tests:  See full reports for all details     Mr Brain Wo Contrast  Result Date: 10/05/2017 CLINICAL DATA:  RIGHT-sided weakness.  Patient woke up and fell. EXAM: MRI HEAD WITHOUT CONTRAST MRA HEAD WITHOUT CONTRAST TECHNIQUE: Multiplanar, multiecho pulse sequences of the brain and surrounding structures were obtained without intravenous contrast. Angiographic images of the head were obtained using MRA technique without contrast. COMPARISON:  MR brain 09/05/2017. FINDINGS: MRI HEAD FINDINGS Brain: No evidence for acute infarction, hemorrhage, mass lesion, hydrocephalus, or extra-axial fluid. Normal for age cerebral volume. Minor white matter disease likely chronic microvascular ischemic change  from hypertension and/or hyperlipidemia. Chronic LEFT thalamic lacunar stroke. Vascular: Normal flow voids. Skull and upper cervical spine: Normal marrow signal. Sinuses/Orbits: Negative. Other: None. MRA HEAD FINDINGS The internal carotid arteries are widely patent. The basilar artery is widely patent with vertebrals codominant. There is no proximal stenosis of the anterior, middle, or posterior cerebral arteries. No cerebellar branch occlusion. No intracranial saccular aneurysm. IMPRESSION: Unremarkable MRI brain. No acute stroke or interval change from priors. Minor white matter disease. Unremarkable MRA of the intracranial circulation. Electronically Signed   By: Staci Righter M.D.   On: 10/05/2017 09:41   Mr Cervical Spine Wo Contrast  Result Date: 10/05/2017 CLINICAL DATA:  Neck and BILATERAL arm pain. EXAM: MRI CERVICAL SPINE WITHOUT CONTRAST TECHNIQUE: Multiplanar, multisequence MR imaging of the cervical spine was performed. No intravenous contrast was administered. COMPARISON:  None. FINDINGS: Alignment: Trace anterolisthesis C3-4, trace retrolisthesis C4-5, both facet mediated. Vertebrae: No fracture, evidence of discitis, or bone lesion. Cord: Normal signal and morphology. Posterior Fossa, vertebral arteries, paraspinal tissues: Negative. Disc levels: C2-3:  Normal. C3-4: Trace anterolisthesis. LEFT greater than RIGHT facet arthropathy. Uncinate spurring also asymmetric to the LEFT. LEFT C4 foraminal narrowing. No soft disc protrusion. C4-5: Trace retrolisthesis. BILATERAL facet arthropathy. LEFT uncinate spurring contributes to LEFT C5 foraminal narrowing. C5-6: BILATERAL facet arthropathy. Shallow central protrusion. RIGHT greater than LEFT C6 foraminal narrowing C6-7: LEFT greater than RIGHT facet arthropathy and uncinate spurring. Mild LEFT C7 foraminal narrowing. C7-T1:  Unremarkable IMPRESSION: Multilevel spondylosis involving both disc and facet pathology. Potentially symptomatic foraminal  narrowing at C3-4 through C6-7 as described above. If further investigation desired, cervical myelogram and postmyelogram CT, could provide additional information regarding the relative  contributions of each level to the patient's pain syndrome. Electronically Signed   By: Staci Righter M.D.   On: 10/05/2017 15:12   US Carotid Bilateral (at [facility])  Result Date: 10/05/2017 CLINICAL DATA:  Right-sided weakness, hypertension, syncope EXAM: BILATERAL CAROTID DUPLEX ULTRASOUND TECHNIQUE: Pearline Cables scale imaging, color Doppler and duplex ultrasound were performed of bilateral carotid and vertebral arteries in the neck. COMPARISON:  10/05/2017 MRI FINDINGS: Criteria: Quantification of carotid stenosis is based on velocity parameters that correlate the residual internal carotid diameter with NASCET-based stenosis levels, using the diameter of the distal internal carotid lumen as the denominator for stenosis measurement. The following velocity measurements were obtained: RIGHT ICA:  150/54 cm/sec CCA:  24/40 cm/sec SYSTOLIC ICA/CCA RATIO:  1.9 DIASTOLIC ICA/CCA RATIO:  2.3 ECA:  99 cm/sec LEFT ICA:  121/46 cm/sec CCA:  102/72 cm/sec SYSTOLIC ICA/CCA RATIO:  1.1 DIASTOLIC ICA/CCA RATIO:  1.8 ECA:  76 cm/sec RIGHT CAROTID ARTERY: Minor echogenic shadowing plaque formation. No hemodynamically significant right ICA stenosis, velocity elevation, or turbulent flow. Degree of narrowing less than 50%. RIGHT VERTEBRAL ARTERY:  Antegrade LEFT CAROTID ARTERY: Similar scattered minor echogenic plaque formation. No hemodynamically significant left ICA stenosis, velocity elevation, or turbulent flow. LEFT VERTEBRAL ARTERY:  Antegrade IMPRESSION: Moderate bilateral carotid atherosclerosis. No hemodynamically significant ICA stenosis. Degree of narrowing less than 50% bilaterally by ultrasound criteria. Patent antegrade vertebral flow bilaterally Electronically Signed   By: Jerilynn Mages.  Shick M.D.   On: 10/05/2017 10:31   Mr Jodene Nam  Head/brain ZD Cm  Result Date: 10/05/2017 CLINICAL DATA:  RIGHT-sided weakness.  Patient woke up and fell. EXAM: MRI HEAD WITHOUT CONTRAST MRA HEAD WITHOUT CONTRAST TECHNIQUE: Multiplanar, multiecho pulse sequences of the brain and surrounding structures were obtained without intravenous contrast. Angiographic images of the head were obtained using MRA technique without contrast. COMPARISON:  MR brain 09/05/2017. FINDINGS: MRI HEAD FINDINGS Brain: No evidence for acute infarction, hemorrhage, mass lesion, hydrocephalus, or extra-axial fluid. Normal for age cerebral volume. Minor white matter disease likely chronic microvascular ischemic change from hypertension and/or hyperlipidemia. Chronic LEFT thalamic lacunar stroke. Vascular: Normal flow voids. Skull and upper cervical spine: Normal marrow signal. Sinuses/Orbits: Negative. Other: None. MRA HEAD FINDINGS The internal carotid arteries are widely patent. The basilar artery is widely patent with vertebrals codominant. There is no proximal stenosis of the anterior, middle, or posterior cerebral arteries. No cerebellar branch occlusion. No intracranial saccular aneurysm. IMPRESSION: Unremarkable MRI brain. No acute stroke or interval change from priors. Minor white matter disease. Unremarkable MRA of the intracranial circulation. Electronically Signed   By: Staci Righter M.D.   On: 10/05/2017 09:41   Ct Head Code Stroke Wo Contrast  Result Date: 10/04/2017 CLINICAL DATA:  Code stroke.  Right-sided weakness EXAM: CT HEAD WITHOUT CONTRAST TECHNIQUE: Contiguous axial images were obtained from the base of the skull through the vertex without intravenous contrast. COMPARISON:  Brain MRI 09/05/2017 FINDINGS: Brain: No mass lesion or acute hemorrhage. No focal hypoattenuation of the basal ganglia or cortex to indicate infarcted tissue. No hydrocephalus or age advanced atrophy. Vascular: No hyperdense vessel. No advanced atherosclerotic calcification of the arteries  at the skull base. Skull: Normal visualized skull base, calvarium and extracranial soft tissues. Sinuses/Orbits: No sinus fluid levels or advanced mucosal thickening. No mastoid effusion. Normal orbits. ASPECTS [facility] Stroke Program Early CT Score) - Ganglionic level infarction (caudate, lentiform nuclei, internal capsule, insula, M1-M3 cortex): 7 - Supraganglionic infarction (M4-M6 cortex): 3 Total score (0-10 with 10 being  normal): 10 IMPRESSION: 1. Normal head CT 2. ASPECTS is 10. These results were called by telephone at the time of interpretation on 10/04/2017 at 9:55 pm to Dr. Lavonia Drafts , who verbally acknowledged these results. Electronically Signed   By: Ulyses Jarred M.D.   On: 10/04/2017 21:55       Today   Subjective:   Vinnie Level patient probably feels a little better right-sided weakness improved  Objective:   Blood pressure 136/75, pulse 72, temperature 98.6 F (37 C), resp. rate 18, height 6\' 1"  (1.854 m), weight 205 lb 6.4 oz (93.2 kg), SpO2 96 %.  .  Intake/Output Summary (Last 24 hours) at 10/06/17 1325 Last data filed at 10/06/17 0800  Gross per 24 hour  Intake              600 ml  Output                0 ml  Net              600 ml    Exam VITAL SIGNS: Blood pressure 136/75, pulse 72, temperature 98.6 F (37 C), resp. rate 18, height 6\' 1"  (1.854 m), weight 205 lb 6.4 oz (93.2 kg), SpO2 96 %.  GENERAL:  66 y.o.-year-old patient lying in the bed with no acute distress.  EYES: Pupils equal, round, reactive to light and accommodation. No scleral icterus. Extraocular muscles intact.  HEENT: Head atraumatic, normocephalic. Oropharynx and nasopharynx clear.  NECK:  Supple, no jugular venous distention. No thyroid enlargement, no tenderness.  LUNGS: Normal breath sounds bilaterally, no wheezing, rales,rhonchi or crepitation. No use of accessory muscles of respiration.  CARDIOVASCULAR: S1, S2 normal. No murmurs, rubs, or gallops.  ABDOMEN: Soft, nontender,  nondistended. Bowel sounds present. No organomegaly or mass.  EXTREMITIES: No pedal edema, cyanosis, or clubbing.  NEUROLOGIC: Cranial nerves II through XII are intact. Muscle strength 5/5 in all extremities. Sensation intact. Gait not checked.  PSYCHIATRIC: The patient is alert and oriented x 3.  SKIN: No obvious rash, lesion, or ulcer.   Data Review     CBC w Diff: Lab Results  Component Value Date   WBC 6.2 10/05/2017   HGB 8.6 (L) 10/05/2017   HGB 12.4 09/18/2012   HCT 26.0 (L) 10/05/2017   HCT 37.3 09/18/2012   PLT 259 10/05/2017   PLT 450 (H) 09/18/2012   LYMPHOPCT 35 10/04/2017   MONOPCT 7 10/04/2017   EOSPCT 5 10/04/2017   BASOPCT 0 10/04/2017   CMP: Lab Results  Component Value Date   NA 139 10/04/2017   NA 139 09/18/2012   K 4.7 10/04/2017   K 3.3 (L) 09/18/2012   CL 107 10/04/2017   CL 99 09/18/2012   CO2 24 10/04/2017   CO2 33 (H) 09/18/2012   BUN 18 10/04/2017   BUN 13 09/18/2012   CREATININE 1.21 (H) 10/05/2017   CREATININE 0.72 09/18/2012   PROT 6.6 10/04/2017   PROT 7.3 09/18/2012   ALBUMIN 3.1 (L) 10/04/2017   ALBUMIN 2.8 (L) 09/18/2012   BILITOT 0.7 10/04/2017   BILITOT 0.5 09/18/2012   ALKPHOS 152 (H) 10/04/2017   ALKPHOS 163 (H) 09/18/2012   AST 21 10/04/2017   AST 19 09/18/2012   ALT 14 10/04/2017   ALT 10 (L) 09/18/2012  .  Micro Results No results found for this or any previous visit (from the past 240 hour(s)).      Code Status Orders  Start     Ordered   10/05/17 0053  Full code  Continuous     10/05/17 0052    Code Status History    Date Active Date Inactive Code Status Order ID Comments User Context   08/26/2017 10:12 PM 09/12/2017  8:32 PM Full Code [id number]  Lance Coon, MD Inpatient   02/23/2015 12:58 AM 02/24/2015 12:53 PM Full Code [id number]  Ivor Costa, MD Inpatient    Advance Directive Documentation     Most Recent Value  Type of Advance Directive  Healthcare Power of Attorney  Pre-existing out of facility  DNR order (yellow form or pink MOST form)  -  "MOST" Form in Place?  -          Follow-up Information    Meade Maw, MD. Schedule an appointment as soon as possible for a visit in 2 week(s).   Specialty:  Neurosurgery Why:  cervical spine stenosis -- Office Closed  Contact information: [place] [address] [phone]        [name], [name], MD. Schedule an appointment as soon as possible for a visit in 1 week(s).   Specialty:  Internal Medicine Why:  Office Closed  Contact information: [address] [place] [address] [phone]           Discharge Medications   Allergies as of 10/06/2017      Reactions   Demerol [meperidine] Hives   Strawberry (diagnostic) Hives      Medication List    TAKE these medications   acetaminophen 500 MG tablet Commonly known as:  TYLENOL Take 1,000 mg by mouth every 6 (six) hours as needed for moderate pain (pain).   albuterol 108 (90 Base) MCG/ACT inhaler Commonly known as:  PROVENTIL HFA;VENTOLIN HFA Inhale 2 puffs into the lungs every 4 (four) hours as needed for wheezing or shortness of breath.   amLODipine 2.5 MG tablet Commonly known as:  NORVASC Take 2.5 mg by mouth daily.   ARIPiprazole 15 MG tablet Commonly known as:  ABILIFY Take 15 mg by mouth daily.   aspirin 325 MG EC tablet Take 1 tablet (325 mg total) by mouth daily.   benztropine 0.5 MG tablet Commonly known as:  COGENTIN Take 0.5 mg by mouth 2 (two) times daily.   FLUoxetine 20 MG capsule Commonly known as:  PROZAC Take 20 mg by mouth daily.   gabapentin 300 MG capsule Commonly known as:  NEURONTIN Take 1 capsule (300 mg total) by mouth 3 (three) times daily as needed. What changed:  reasons to take this   hydrOXYzine 25 MG capsule Commonly known as:  VISTARIL Take 1 capsule (25 mg total) by mouth 3 (three) times daily as needed for anxiety. Take 25 mg by mouth twice daily and take 50 mg by  mouth at bedtime as needed   mirtazapine 45 MG tablet Commonly known as:  REMERON Take 45 mg by mouth at bedtime.   nabumetone 500 MG tablet Commonly known as:  RELAFEN Take 500 mg by mouth 2 (two) times daily as needed for pain.   pantoprazole 40 MG tablet Commonly known as:  PROTONIX Take 40 mg by mouth daily.   potassium chloride 10 MEQ tablet Commonly known as:  K-DUR Take 10 mEq by mouth daily.          Total Time in preparing paper work, data evaluation and todays exam - 35 minutes  Posey Pronto, [facility] M.D on 10/06/2017 at 1:25 PM  [facility]   Office  [phone]

## 2017-10-06 NOTE — Progress Notes (Signed)
Subjective: Right sided weakness improved but still continues.  Patient reports numbness some better as well but continues.  Ambulating with walker.  Continued pain.    Objective: Current vital signs: BP 136/75 (BP Location: Right Arm)   Pulse 72   Temp 98.6 F (37 C)   Resp 18   Ht 6\' 1"  (1.854 m)   Wt 93.2 kg (205 lb 6.4 oz)   SpO2 96%   BMI 27.10 kg/m  Vital signs in last 24 hours: Temp:  [97.8 F (36.6 C)-98.6 F (37 C)] 98.6 F (37 C) (10/21 1217) Pulse Rate:  [68-96] 72 (10/21 1217) Resp:  [14-21] 18 (10/21 1217) BP: (113-141)/(55-75) 136/75 (10/21 1217) SpO2:  [93 %-100 %] 96 % (10/21 1217)  Intake/Output from previous day: 10/20 0701 - 10/21 0700 In: 360 [P.O.:360] Out: -  Intake/Output this shift: Total I/O In: 240 [P.O.:240] Out: -  Nutritional status: DIET DYS 3 Room service appropriate? Yes with Assist; Fluid consistency: Thin  Cervical: No significant pain to palpation at the neck and occipital ridge.   Neurologic Exam: Mental Status: Alert, oriented, thought content appropriate.  Speech fluent without evidence of aphasia.  Able to follow 3 step commands without difficulty. Cranial Nerves: II: Discs flat bilaterally; Visual fields grossly normal with right eye blurry vision, pupils equal, round, reactive to light and accommodation III,IV, VI: ptosis not present, extra-ocular motions intact bilaterally V,VII: mild decrease in right NLF, facial light touch sensation decreased on the right forehead VIII: hearing normal bilaterally IX,X: gag reflex present XI: bilateral shoulder shrug XII: midline tongue extension Motor: Right :  Upper extremity   5-/5 +drift without pronation                                   Left:     Upper extremity   5/5             Lower extremity   5-/5                                                                                     Lower extremity   5-/5   Some limitations due to pain   Sensory: Pinprick and light touch decreased  on the right splitting the midline  Lab Results: Basic Metabolic Panel:  Recent Labs Lab 10/04/17 2149 10/05/17 0350  NA 139  --   K 4.7  --   CL 107  --   CO2 24  --   GLUCOSE 115*  --   BUN 18  --   CREATININE 1.54* 1.21*  CALCIUM 8.5*  --     Liver Function Tests:  Recent Labs Lab 10/04/17 2149  AST 21  ALT 14  ALKPHOS 152*  BILITOT 0.7  PROT 6.6  ALBUMIN 3.1*   No results for input(s): LIPASE, AMYLASE in the last 168 hours. No results for input(s): AMMONIA in the last 168 hours.  CBC:  Recent Labs Lab 10/04/17 2149 10/05/17 0350  WBC 8.0 6.2  NEUTROABS 4.2  --   HGB 9.1* 8.6*  HCT 28.0* 26.0*  MCV 80.6 80.2  PLT 287 259    Cardiac Enzymes:  Recent Labs Lab 10/04/17 2149  TROPONINI <0.03    Lipid Panel:  Recent Labs Lab 10/05/17 0350  CHOL 195  TRIG 169*  HDL 45  CHOLHDL 4.3  VLDL 34  LDLCALC 116*    CBG:  Recent Labs Lab 10/04/17 2149  GLUCAP 120*    Microbiology: Results for orders placed or performed during the hospital encounter of 08/26/17  C difficile quick scan w PCR reflex     Status: None   Collection Time: 08/26/17  8:37 PM  Result Value Ref Range Status   C Diff antigen NEGATIVE NEGATIVE Final   C Diff toxin NEGATIVE NEGATIVE Final   C Diff interpretation No C. difficile detected.  Final  Culture, blood (Routine X 2) w Reflex to ID Panel     Status: None   Collection Time: 08/26/17  9:48 PM  Result Value Ref Range Status   Specimen Description BLOOD LEFT HAND  Final   Special Requests   Final    BOTTLES DRAWN AEROBIC AND ANAEROBIC Blood Culture adequate volume   Culture NO GROWTH 5 DAYS  Final   Report Status 08/31/2017 FINAL  Final  Culture, blood (Routine X 2) w Reflex to ID Panel     Status: None   Collection Time: 08/26/17  9:48 PM  Result Value Ref Range Status   Specimen Description BLOOD RAC  Final   Special Requests   Final    BOTTLES DRAWN AEROBIC AND ANAEROBIC Blood Culture results may not be  optimal due to an excessive volume of blood received in culture bottles   Culture NO GROWTH 5 DAYS  Final   Report Status 08/31/2017 FINAL  Final  MRSA PCR Screening     Status: None   Collection Time: 08/28/17 11:12 AM  Result Value Ref Range Status   MRSA by PCR NEGATIVE NEGATIVE Final    Comment:        The GeneXpert MRSA Assay (FDA approved for NASAL specimens only), is one component of a comprehensive MRSA colonization surveillance program. It is not intended to diagnose MRSA infection nor to guide or monitor treatment for MRSA infections.   Culture, respiratory (NON-Expectorated)     Status: Abnormal   Collection Time: 08/29/17 10:46 PM  Result Value Ref Range Status   Specimen Description SPUTUM  Final   Special Requests NONE  Final   Gram Stain   Final    RARE WBC PRESENT,BOTH PMN AND MONONUCLEAR NO SQUAMOUS EPITHELIAL CELLS SEEN NO ORGANISMS SEEN Performed at [facility], [address], [place], [place] [address]    Culture MULTIPLE ORGANISMS PRESENT, NONE PREDOMINANT (A)  Final   Report Status 09/01/2017 FINAL  Final    Coagulation Studies:  Recent Labs  10/04/17 2149  LABPROT 12.6  INR 0.95    Imaging: Mr Brain Wo Contrast  Result Date: 10/05/2017 CLINICAL DATA:  RIGHT-sided weakness.  Patient woke up and fell. EXAM: MRI HEAD WITHOUT CONTRAST MRA HEAD WITHOUT CONTRAST TECHNIQUE: Multiplanar, multiecho pulse sequences of the brain and surrounding structures were obtained without intravenous contrast. Angiographic images of the head were obtained using MRA technique without contrast. COMPARISON:  MR brain 09/05/2017. FINDINGS: MRI HEAD FINDINGS Brain: No evidence for acute infarction, hemorrhage, mass lesion, hydrocephalus, or extra-axial fluid. Normal for age cerebral volume. Minor white matter disease likely chronic microvascular ischemic change from hypertension and/or hyperlipidemia. Chronic LEFT thalamic lacunar stroke. Vascular: Normal flow voids.  Skull and upper  cervical spine: Normal marrow signal. Sinuses/Orbits: Negative. Other: None. MRA HEAD FINDINGS The internal carotid arteries are widely patent. The basilar artery is widely patent with vertebrals codominant. There is no proximal stenosis of the anterior, middle, or posterior cerebral arteries. No cerebellar branch occlusion. No intracranial saccular aneurysm. IMPRESSION: Unremarkable MRI brain. No acute stroke or interval change from priors. Minor white matter disease. Unremarkable MRA of the intracranial circulation. Electronically Signed   By: Staci Righter M.D.   On: 10/05/2017 09:41   Mr Cervical Spine Wo Contrast  Result Date: 10/05/2017 CLINICAL DATA:  Neck and BILATERAL arm pain. EXAM: MRI CERVICAL SPINE WITHOUT CONTRAST TECHNIQUE: Multiplanar, multisequence MR imaging of the cervical spine was performed. No intravenous contrast was administered. COMPARISON:  None. FINDINGS: Alignment: Trace anterolisthesis C3-4, trace retrolisthesis C4-5, both facet mediated. Vertebrae: No fracture, evidence of discitis, or bone lesion. Cord: Normal signal and morphology. Posterior Fossa, vertebral arteries, paraspinal tissues: Negative. Disc levels: C2-3:  Normal. C3-4: Trace anterolisthesis. LEFT greater than RIGHT facet arthropathy. Uncinate spurring also asymmetric to the LEFT. LEFT C4 foraminal narrowing. No soft disc protrusion. C4-5: Trace retrolisthesis. BILATERAL facet arthropathy. LEFT uncinate spurring contributes to LEFT C5 foraminal narrowing. C5-6: BILATERAL facet arthropathy. Shallow central protrusion. RIGHT greater than LEFT C6 foraminal narrowing C6-7: LEFT greater than RIGHT facet arthropathy and uncinate spurring. Mild LEFT C7 foraminal narrowing. C7-T1:  Unremarkable IMPRESSION: Multilevel spondylosis involving both disc and facet pathology. Potentially symptomatic foraminal narrowing at C3-4 through C6-7 as described above. If further investigation desired, cervical myelogram and  postmyelogram CT, could provide additional information regarding the relative contributions of each level to the patient's pain syndrome. Electronically Signed   By: Staci Righter M.D.   On: 10/05/2017 15:12   US Carotid Bilateral (at [facility] Only)  Result Date: 10/05/2017 CLINICAL DATA:  Right-sided weakness, hypertension, syncope EXAM: BILATERAL CAROTID DUPLEX ULTRASOUND TECHNIQUE: Pearline Cables scale imaging, color Doppler and duplex ultrasound were performed of bilateral carotid and vertebral arteries in the neck. COMPARISON:  10/05/2017 MRI FINDINGS: Criteria: Quantification of carotid stenosis is based on velocity parameters that correlate the residual internal carotid diameter with NASCET-based stenosis levels, using the diameter of the distal internal carotid lumen as the denominator for stenosis measurement. The following velocity measurements were obtained: RIGHT ICA:  150/54 cm/sec CCA:  16/10 cm/sec SYSTOLIC ICA/CCA RATIO:  1.9 DIASTOLIC ICA/CCA RATIO:  2.3 ECA:  99 cm/sec LEFT ICA:  121/46 cm/sec CCA:  960/45 cm/sec SYSTOLIC ICA/CCA RATIO:  1.1 DIASTOLIC ICA/CCA RATIO:  1.8 ECA:  76 cm/sec RIGHT CAROTID ARTERY: Minor echogenic shadowing plaque formation. No hemodynamically significant right ICA stenosis, velocity elevation, or turbulent flow. Degree of narrowing less than 50%. RIGHT VERTEBRAL ARTERY:  Antegrade LEFT CAROTID ARTERY: Similar scattered minor echogenic plaque formation. No hemodynamically significant left ICA stenosis, velocity elevation, or turbulent flow. LEFT VERTEBRAL ARTERY:  Antegrade IMPRESSION: Moderate bilateral carotid atherosclerosis. No hemodynamically significant ICA stenosis. Degree of narrowing less than 50% bilaterally by ultrasound criteria. Patent antegrade vertebral flow bilaterally Electronically Signed   By: Jerilynn Mages.  Shick M.D.   On: 10/05/2017 10:31   Mr Jodene Nam Head/brain [facility] Cm  Result Date: 10/05/2017 CLINICAL DATA:  RIGHT-sided weakness.  Patient woke up and fell. EXAM:  MRI HEAD WITHOUT CONTRAST MRA HEAD WITHOUT CONTRAST TECHNIQUE: Multiplanar, multiecho pulse sequences of the brain and surrounding structures were obtained without intravenous contrast. Angiographic images of the head were obtained using MRA technique without contrast. COMPARISON:  MR brain 09/05/2017. FINDINGS: MRI HEAD FINDINGS Brain:  No evidence for acute infarction, hemorrhage, mass lesion, hydrocephalus, or extra-axial fluid. Normal for age cerebral volume. Minor white matter disease likely chronic microvascular ischemic change from hypertension and/or hyperlipidemia. Chronic LEFT thalamic lacunar stroke. Vascular: Normal flow voids. Skull and upper cervical spine: Normal marrow signal. Sinuses/Orbits: Negative. Other: None. MRA HEAD FINDINGS The internal carotid arteries are widely patent. The basilar artery is widely patent with vertebrals codominant. There is no proximal stenosis of the anterior, middle, or posterior cerebral arteries. No cerebellar branch occlusion. No intracranial saccular aneurysm. IMPRESSION: Unremarkable MRI brain. No acute stroke or interval change from priors. Minor white matter disease. Unremarkable MRA of the intracranial circulation. Electronically Signed   By: Staci Righter M.D.   On: 10/05/2017 09:41   Ct Head Code Stroke Wo Contrast  Result Date: 10/04/2017 CLINICAL DATA:  Code stroke.  Right-sided weakness EXAM: CT HEAD WITHOUT CONTRAST TECHNIQUE: Contiguous axial images were obtained from the base of the skull through the vertex without intravenous contrast. COMPARISON:  Brain MRI 09/05/2017 FINDINGS: Brain: No mass lesion or acute hemorrhage. No focal hypoattenuation of the basal ganglia or cortex to indicate infarcted tissue. No hydrocephalus or age advanced atrophy. Vascular: No hyperdense vessel. No advanced atherosclerotic calcification of the arteries at the skull base. Skull: Normal visualized skull base, calvarium and extracranial soft tissues. Sinuses/Orbits: No  sinus fluid levels or advanced mucosal thickening. No mastoid effusion. Normal orbits. ASPECTS [facility] Stroke Program Early CT Score) - Ganglionic level infarction (caudate, lentiform nuclei, internal capsule, insula, M1-M3 cortex): 7 - Supraganglionic infarction (M4-M6 cortex): 3 Total score (0-10 with 10 being normal): 10 IMPRESSION: 1. Normal head CT 2. ASPECTS is 10. These results were called by telephone at the time of interpretation on 10/04/2017 at 9:55 pm to Dr. Lavonia Drafts , who verbally acknowledged these results. Electronically Signed   By: Ulyses Jarred M.D.   On: 10/04/2017 21:55    Medications:  I have reviewed the patient's current medications. Scheduled: . amLODipine  2.5 mg Oral Daily  . ARIPiprazole  15 mg Oral Daily  . aspirin EC  325 mg Oral Daily  . benztropine  0.5 mg Oral BID  . enoxaparin (LOVENOX) injection  40 mg Subcutaneous Q24H  . FLUoxetine  20 mg Oral Daily  . gabapentin  300 mg Oral TID  . mirtazapine  45 mg Oral QHS  . pantoprazole  40 mg Oral Daily    Assessment/Plan: Patient with continued complaints although better.  MRI of the brain shows no acute changes and MRI of the cervical spine shows nothing to explain current symptoms.    Recommendations: 1.  Agree with therapy. May continue on an outpatient basis. 2.  Patient to continue follow up with neurology on an outpatient basis.     LOS: 0 days   Alexis Goodell, MD Neurology [phone] 10/06/2017  12:48 PM

## 2017-10-08 ENCOUNTER — Other Ambulatory Visit: Payer: Self-pay | Admitting: Nurse Practitioner

## 2017-10-08 DIAGNOSIS — R51 Headache: Principal | ICD-10-CM

## 2017-10-08 DIAGNOSIS — R519 Headache, unspecified: Secondary | ICD-10-CM

## 2017-10-14 ENCOUNTER — Ambulatory Visit: Payer: Medicare HMO

## 2017-10-18 ENCOUNTER — Ambulatory Visit: Payer: Medicare HMO

## 2017-10-21 ENCOUNTER — Ambulatory Visit: Payer: Medicare HMO

## 2017-10-22 ENCOUNTER — Emergency Department: Payer: Medicare HMO

## 2017-10-22 ENCOUNTER — Ambulatory Visit: Payer: Medicare HMO

## 2017-10-22 ENCOUNTER — Emergency Department
Admission: EM | Admit: 2017-10-22 | Discharge: 2017-10-22 | Disposition: A | Discharge status: Home / Self Care | Payer: Medicare HMO | Attending: Emergency Medicine | Admitting: Emergency Medicine

## 2017-10-22 ENCOUNTER — Encounter: Payer: Self-pay | Admitting: *Deleted

## 2017-10-22 DIAGNOSIS — Z79899 Other long term (current) drug therapy: Secondary | ICD-10-CM | POA: Insufficient documentation

## 2017-10-22 DIAGNOSIS — S99912D Unspecified injury of left ankle, subsequent encounter: Secondary | ICD-10-CM | POA: Diagnosis present

## 2017-10-22 DIAGNOSIS — S93402D Sprain of unspecified ligament of left ankle, subsequent encounter: Secondary | ICD-10-CM | POA: Insufficient documentation

## 2017-10-22 DIAGNOSIS — I10 Essential (primary) hypertension: Secondary | ICD-10-CM | POA: Insufficient documentation

## 2017-10-22 DIAGNOSIS — W06XXXD Fall from bed, subsequent encounter: Secondary | ICD-10-CM | POA: Diagnosis not present

## 2017-10-22 DIAGNOSIS — Z7982 Long term (current) use of aspirin: Secondary | ICD-10-CM | POA: Insufficient documentation

## 2017-10-22 DIAGNOSIS — M25572 Pain in left ankle and joints of left foot: Secondary | ICD-10-CM | POA: Diagnosis not present

## 2017-10-22 DIAGNOSIS — Z87891 Personal history of nicotine dependence: Secondary | ICD-10-CM | POA: Diagnosis not present

## 2017-10-22 NOTE — ED Triage Notes (Signed)
Pt states she fell out of bed yesterday and landed on her left foot, pt using walker at baseline

## 2017-10-22 NOTE — Discharge Instructions (Signed)
Ankle sprain as needed

## 2017-10-22 NOTE — ED Provider Notes (Signed)
[facility] Emergency Department Provider Note   ____________________________________________   First MD Initiated Contact with Patient 10/22/17 1119     (approximate)  I have reviewed the triage vital signs and the nursing notes.   HISTORY  Chief Complaint Foot Pain    HPI Morgan [name] is a 66 y.o. female patient complaining of left ankle and foot pain secondary to a fall from a bed last week. Patient state pain is not increased or decreased from baseline since injury. Patient and placed for support before incident secondary to the CVA. Only supportive measures since incident. She rates pain as a 5/10. Patient described a pain as "achy".   Past Medical History:  Diagnosis Date  . Allergy   . Anemia   . Anxiety   . Arthritis   . Bipolar 1 disorder ([place])   . Depression   . GERD (gastroesophageal reflux disease)   . Hyperlipidemia   . Hypertension   . MVP (mitral valve prolapse)     Patient Active Problem List   Diagnosis Date Noted  . Right sided weakness 10/04/2017  . HLD (hyperlipidemia) 10/04/2017  . GERD (gastroesophageal reflux disease) 10/04/2017  . Acute respiratory failure ([place])   . Anxiety 08/26/2017  . Sepsis ([place]) 08/26/2017  . HCAP (healthcare-associated pneumonia) 08/26/2017  . Essential hypertension   . Depression   . CAP (community acquired pneumonia) 02/22/2015  . Cough 02/22/2015  . Diarrhea 02/22/2015  . Hypokalemia 02/22/2015  . Tobacco abuse 02/22/2015  . Mass of breast, right 11/18/2013  . Chronic lower back pain 04/29/2013  . Paresthesia 04/29/2013    Past Surgical History:  Procedure Laterality Date  . ABDOMINAL HYSTERECTOMY    . APPENDECTOMY    . BREAST SURGERY Right 1988   tumor removal  . KNEE SURGERY  2005   2 times left  . TUBAL LIGATION      Prior to Admission medications   Medication Sig Start Date End Date Taking? Authorizing Provider  acetaminophen (TYLENOL) 500 MG tablet Take 1,000 mg by  mouth every 6 (six) hours as needed for moderate pain (pain).     [provider]  albuterol (PROVENTIL HFA;VENTOLIN HFA) 108 (90 BASE) MCG/ACT inhaler Inhale 2 puffs into the lungs every 4 (four) hours as needed for wheezing or shortness of breath. 02/24/15   Ghimire, Henreitta Leber, MD  amLODipine (NORVASC) 2.5 MG tablet Take 2.5 mg by mouth daily.    [provider]  ARIPiprazole (ABILIFY) 15 MG tablet Take 15 mg by mouth daily.     [provider]  aspirin EC 325 MG EC tablet Take 1 tablet (325 mg total) by mouth daily. 10/07/17   Dustin Flock, MD  benztropine (COGENTIN) 0.5 MG tablet Take 0.5 mg by mouth 2 (two) times daily.    [provider]  FLUoxetine (PROZAC) 20 MG capsule Take 20 mg by mouth daily.    [provider]  gabapentin (NEURONTIN) 300 MG capsule Take 1 capsule (300 mg total) by mouth 3 (three) times daily as needed. Patient taking differently: Take 300 mg by mouth 3 (three) times daily as needed (pain).  09/12/17   Hillary Bow, MD  hydrOXYzine (VISTARIL) 25 MG capsule Take 1 capsule (25 mg total) by mouth 3 (three) times daily as needed for anxiety. Take 25 mg by mouth twice daily and take 50 mg by mouth at bedtime as needed 09/12/17   Hillary Bow, MD  mirtazapine (REMERON) 45 MG tablet Take 45 mg  by mouth at bedtime.     [provider]  nabumetone (RELAFEN) 500 MG tablet Take 500 mg by mouth 2 (two) times daily as needed for pain. 10/01/17   [provider]  pantoprazole (PROTONIX) 40 MG tablet Take 40 mg by mouth daily.    [provider]  potassium chloride (K-DUR) 10 MEQ tablet Take 10 mEq by mouth daily.    [provider]    Allergies Demerol [meperidine] and Strawberry (diagnostic)  Family History  Problem Relation Age of Onset  . Breast cancer Mother   . Ovarian cancer Mother   . Hypertension Father   . Hyperlipidemia Father   . Heart disease Father   . Breast cancer Maternal  Grandmother   . Cancer Paternal Grandmother   . Diabetes Paternal Grandmother   . Cancer Paternal Grandfather   . Breast cancer Maternal Aunt   . Breast cancer Maternal Aunt     Social History Social History   Tobacco Use  . Smoking status: Former Smoker    Types: Cigarettes  . Smokeless tobacco: Never Used  Substance Use Topics  . Alcohol use: Yes  . Drug use: No    Review of Systems  Constitutional: No fever/chills Eyes: No visual changes. ENT: No sore throat. Cardiovascular: Denies chest pain. Respiratory: Denies shortness of breath. Gastrointestinal: No abdominal pain.  No nausea, no vomiting.  No diarrhea.  No constipation. Genitourinary: Negative for dysuria. Musculoskeletal: Negative for back pain. Skin: Negative for rash. Neurological: Negative for headaches, focal weakness or numbness. Psychiatric:Anxiety, bipolar, depression. Endocrine:Hyperlipidemia and hypertension. Allergic/Immunilogical: Demerol and strawberries  ____________________________________________   PHYSICAL EXAM:  VITAL SIGNS: ED Triage Vitals [10/22/17 1113]  Enc Vitals Group     BP      Pulse      Resp      Temp      Temp src      SpO2      Weight 209 lb (94.8 kg)     Height 6\' 1"  (1.854 m)     Head Circumference      Peak Flow      Pain Score 5     Pain Loc      Pain Edu?      Excl. in [place]?    Constitutional: Alert and oriented. Well appearing and in no acute distress. Neck: No stridor.  No cervical spine tenderness to palpation. Cardiovascular: Normal rate, regular rhythm. Grossly normal heart sounds.  Good peripheral circulation. Respiratory: Normal respiratory effort.  No retractions. Lungs CTAB. Musculoskeletal: No obvious deformity to the left foot and ankle. Patient is moderate guarding palpation of the lateral malleolus and the cuboid area of the foot.  Neurologic:  Normal speech and language. No gross focal neurologic deficits are appreciated. No gait  instability. Skin:  Skin is warm, dry and intact. No rash noted. Psychiatric: Mood and affect are normal. Speech and behavior are normal.  ____________________________________________   LABS (all labs ordered are listed, but only abnormal results are displayed)  Labs Reviewed - No data to display ____________________________________________  EKG   ____________________________________________  RADIOLOGY  Dg Foot Complete Left  Result Date: 10/22/2017 CLINICAL DATA:  Left foot pain for 4 weeks after fall EXAM: LEFT FOOT - COMPLETE 3+ VIEW COMPARISON:  None. FINDINGS: No fracture or dislocation. No suspicious focal osseous lesion. Mild first metatarsal-phalangeal joint osteoarthritis. Tiny Achilles left calcaneal spurs. No radiopaque foreign body. IMPRESSION: No fracture or malalignment. Mild first metatarsal-phalangeal joint osteoarthritis. Electronically Signed  By: Ilona Sorrel M.D.   On: 10/22/2017 11:46    ____________________________________________   PROCEDURES  Procedure(s) performed: None  Procedures  Critical Care performed: No  ____________________________________________   INITIAL IMPRESSION / ASSESSMENT AND PLAN / ED COURSE  As part of my medical decision making, I reviewed the following data within the electronic MEDICAL RECORD NUMBER    Left foot and ankle pain secondary to a fall. Discussed no acute findings on x-ray. Patient given discharge care instructions. Patient placed in a stirrup splint to assist with ambulation. Patient advised follow-up PCP as needed.      ____________________________________________   FINAL CLINICAL IMPRESSION(S) / ED DIAGNOSES  Final diagnoses:  Sprain of left ankle, unspecified ligament, subsequent encounter      Note:  This document was prepared using Dragon voice recognition software and may include unintentional dictation errors.    Sable Feil, PA-C 10/22/17 1211    Arta Silence, MD 10/22/17  [phone]

## 2017-10-30 IMAGING — MG DIGITAL SCREENING BILATERAL MAMMOGRAM WITH CAD
4 series · 4 of 4 positions shown · non-contrast
Comparison: Previous exam(s).

CLINICAL DATA: Screening.

EXAM:
DIGITAL SCREENING BILATERAL MAMMOGRAM WITH CAD

[L CC]
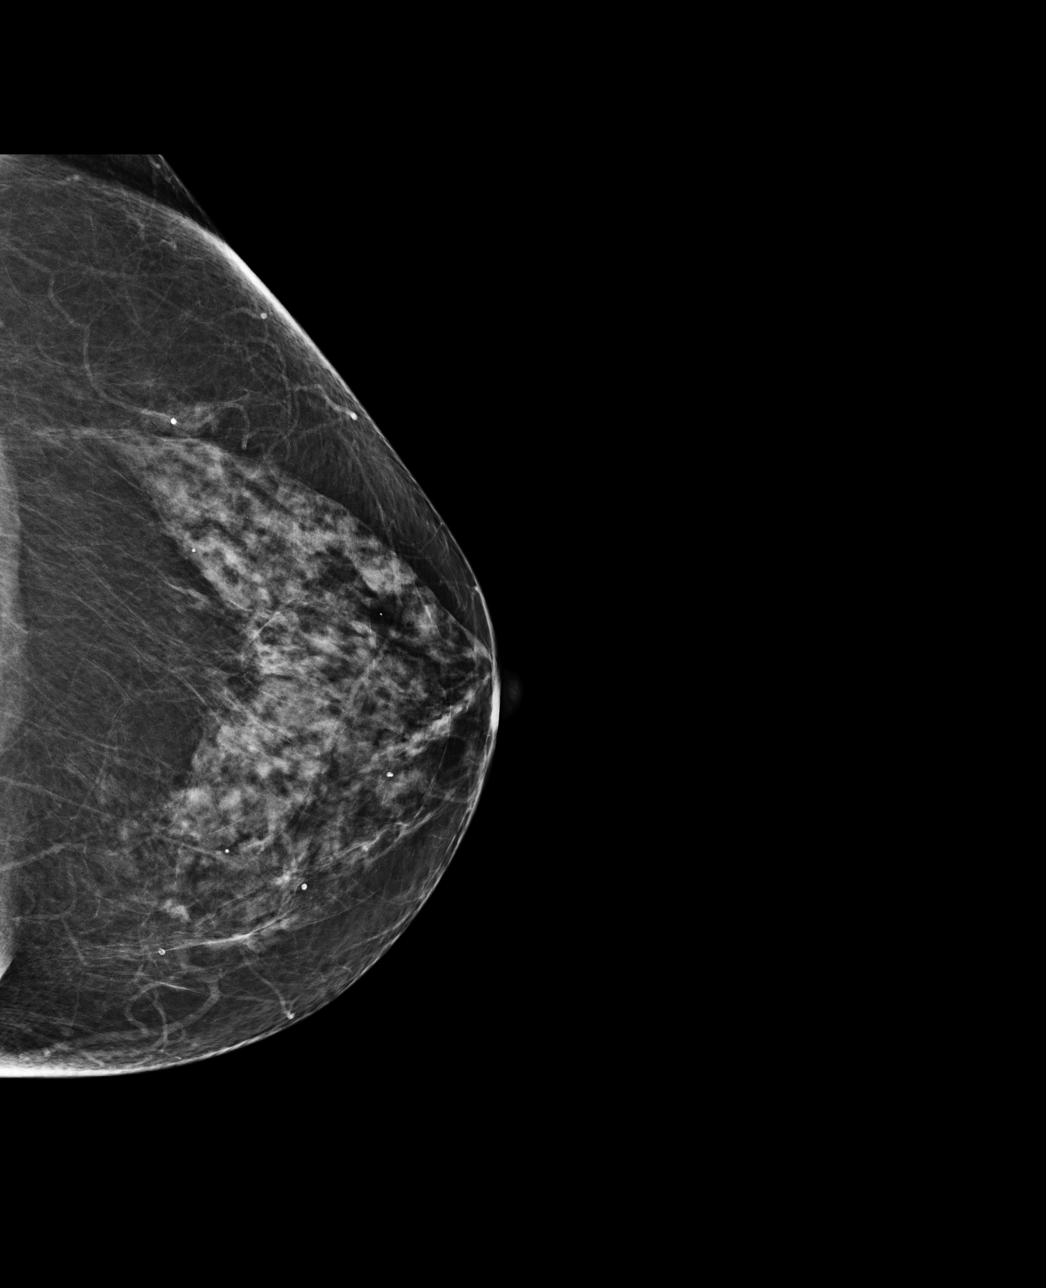

[R MLO]
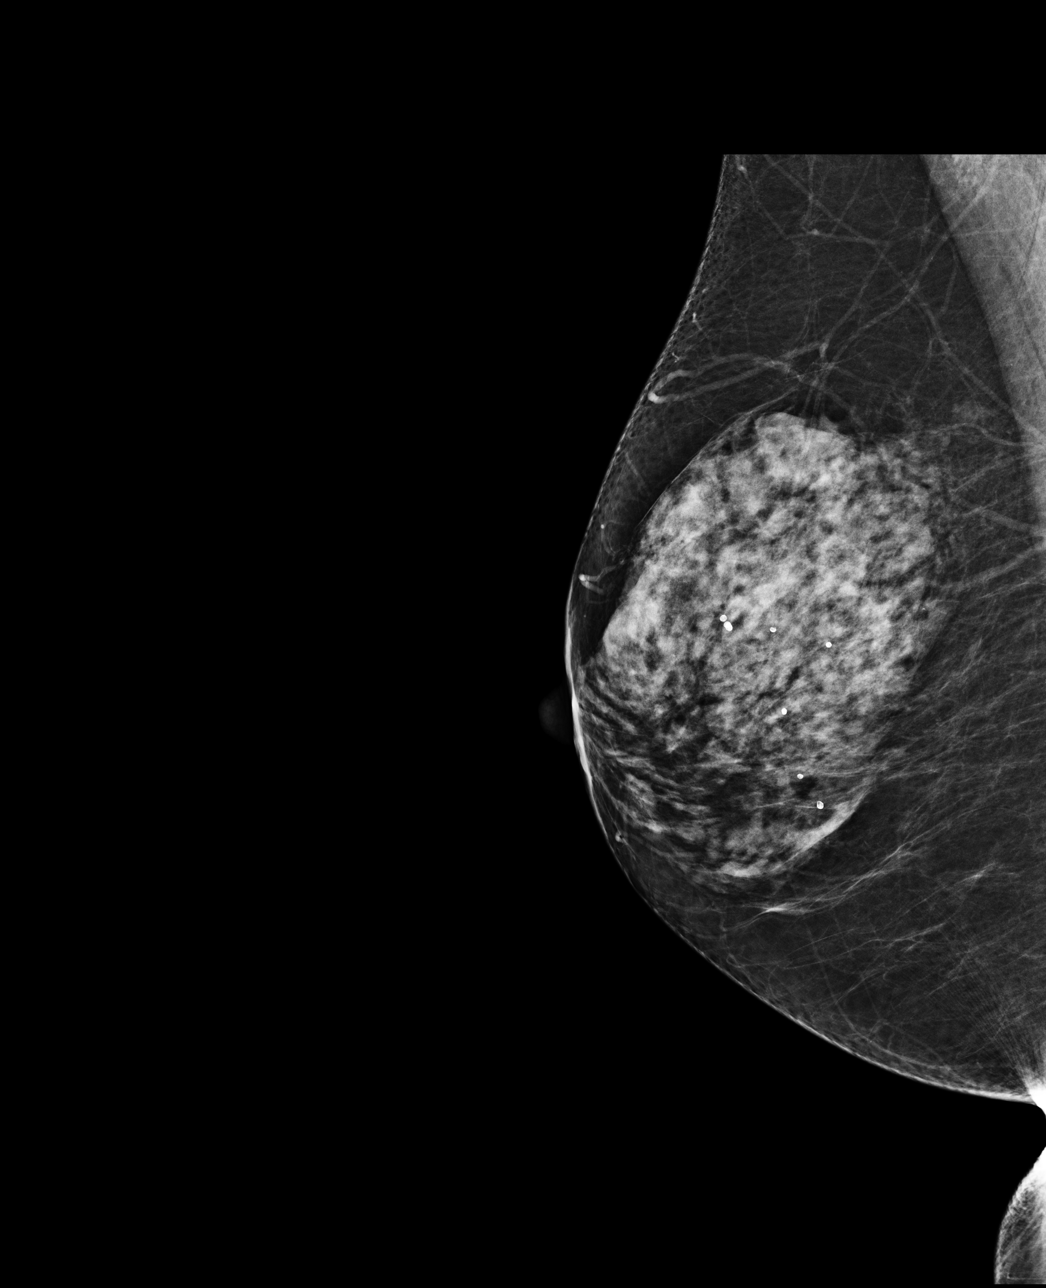

[R CC]
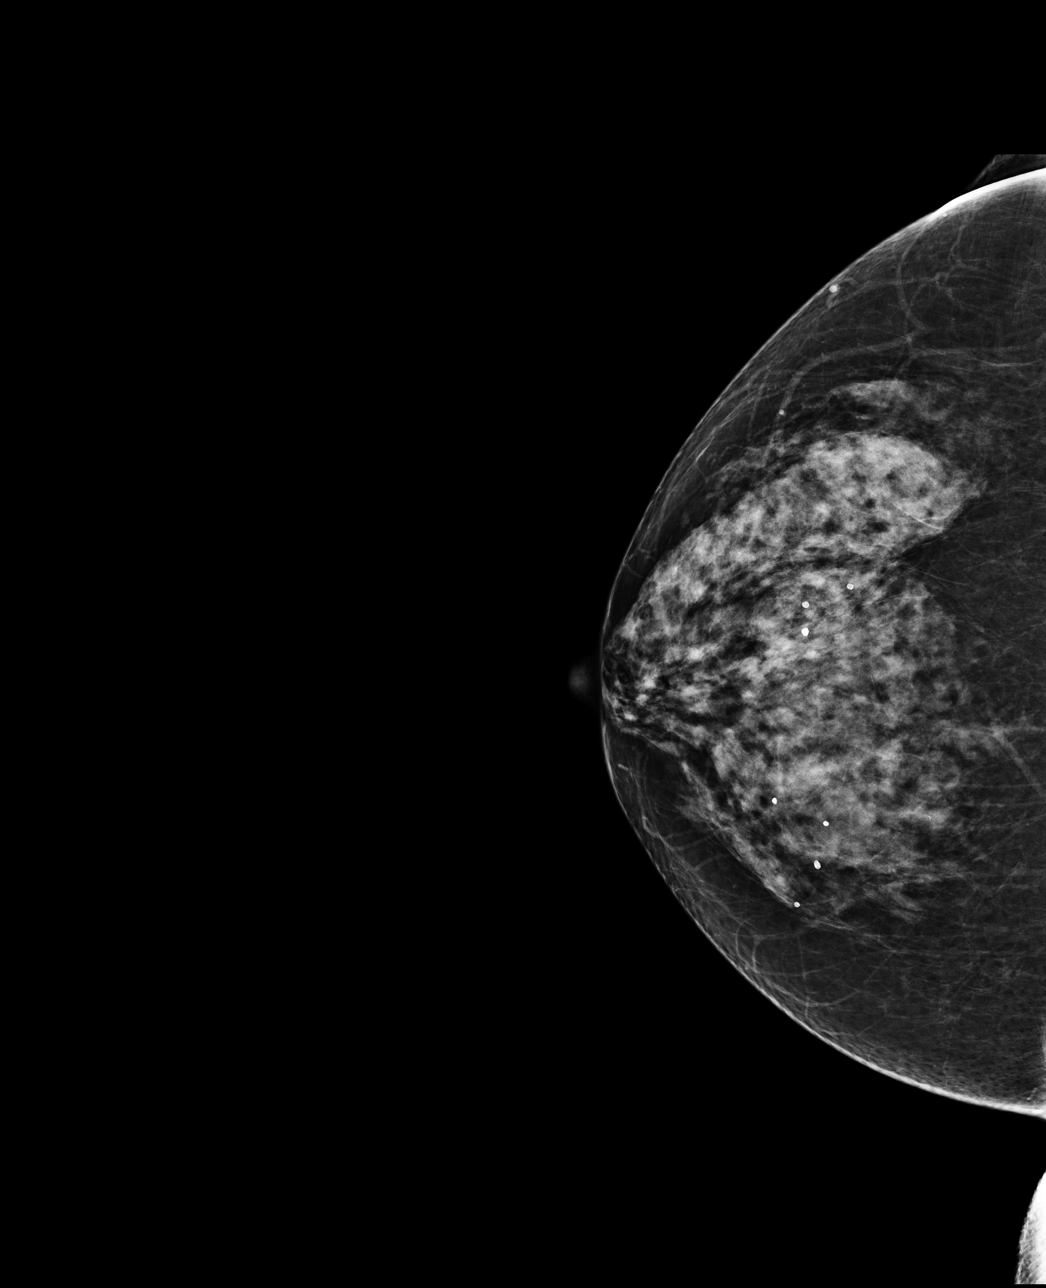

[L MLO]
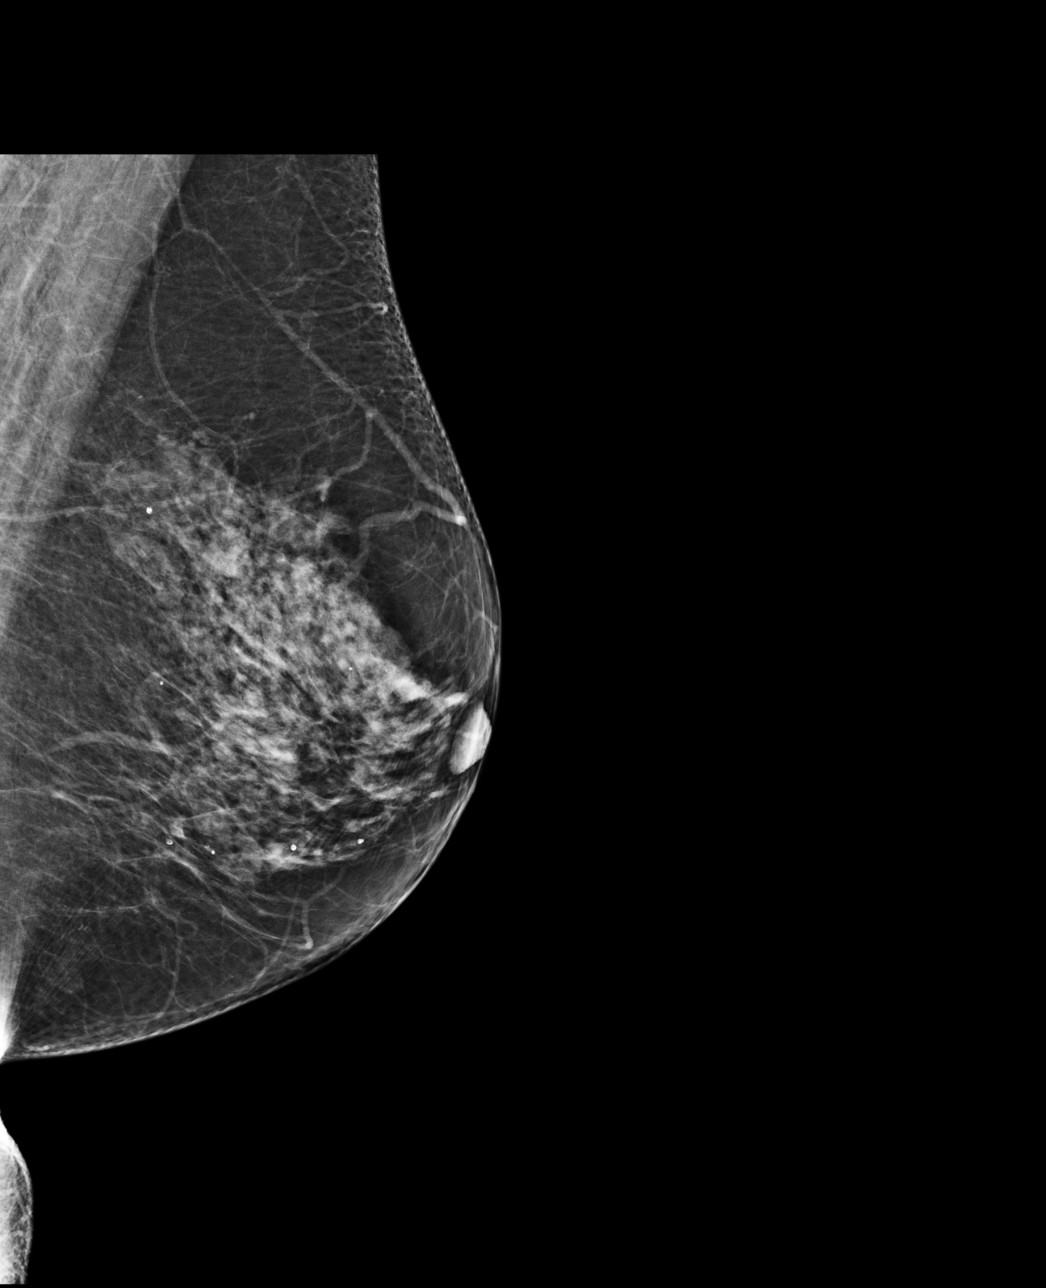

[4 of 4 positions shown; findings below may reference images not displayed]

ACR Breast Density Category c: The breast tissue is heterogeneously
dense, which may obscure small masses.
FINDINGS: There are no findings suspicious for malignancy. Images were
processed with CAD.
IMPRESSION: No mammographic evidence of malignancy. A result letter of this
screening mammogram will be mailed directly to the patient.

RECOMMENDATION:
Screening mammogram in one year. (Code:YJ-[id number])

BI-RADS CATEGORY  1: Negative.

## 2017-11-21 DIAGNOSIS — I340 Nonrheumatic mitral (valve) insufficiency: Secondary | ICD-10-CM | POA: Insufficient documentation

## 2017-11-28 ENCOUNTER — Encounter: Payer: Self-pay | Admitting: Emergency Medicine

## 2017-11-28 ENCOUNTER — Emergency Department: Payer: Medicare HMO

## 2017-11-28 ENCOUNTER — Emergency Department
Admission: EM | Admit: 2017-11-28 | Discharge: 2017-11-28 | Disposition: A | Discharge status: Home / Self Care | Payer: Medicare HMO | Attending: Emergency Medicine | Admitting: Emergency Medicine

## 2017-11-28 DIAGNOSIS — S299XXA Unspecified injury of thorax, initial encounter: Secondary | ICD-10-CM | POA: Diagnosis present

## 2017-11-28 DIAGNOSIS — Y998 Other external cause status: Secondary | ICD-10-CM | POA: Diagnosis not present

## 2017-11-28 DIAGNOSIS — S2232XA Fracture of one rib, left side, initial encounter for closed fracture: Secondary | ICD-10-CM | POA: Diagnosis not present

## 2017-11-28 DIAGNOSIS — I10 Essential (primary) hypertension: Secondary | ICD-10-CM | POA: Insufficient documentation

## 2017-11-28 DIAGNOSIS — W010XXA Fall on same level from slipping, tripping and stumbling without subsequent striking against object, initial encounter: Secondary | ICD-10-CM | POA: Diagnosis not present

## 2017-11-28 DIAGNOSIS — Y9389 Activity, other specified: Secondary | ICD-10-CM | POA: Insufficient documentation

## 2017-11-28 DIAGNOSIS — Z7982 Long term (current) use of aspirin: Secondary | ICD-10-CM | POA: Diagnosis not present

## 2017-11-28 DIAGNOSIS — F419 Anxiety disorder, unspecified: Secondary | ICD-10-CM | POA: Diagnosis not present

## 2017-11-28 DIAGNOSIS — Y929 Unspecified place or not applicable: Secondary | ICD-10-CM | POA: Diagnosis not present

## 2017-11-28 DIAGNOSIS — Z79899 Other long term (current) drug therapy: Secondary | ICD-10-CM | POA: Insufficient documentation

## 2017-11-28 DIAGNOSIS — F319 Bipolar disorder, unspecified: Secondary | ICD-10-CM | POA: Insufficient documentation

## 2017-11-28 DIAGNOSIS — Z87891 Personal history of nicotine dependence: Secondary | ICD-10-CM | POA: Diagnosis not present

## 2017-11-28 MED ORDER — HYDROCODONE-ACETAMINOPHEN 5-325 MG PO TABS
1.0000 | ORAL_TABLET | Freq: Once | ORAL | Status: AC
  Administered 2017-11-28: 1 via ORAL
  Filled 2017-11-28: qty 1

## 2017-11-28 MED ORDER — HYDROCODONE-ACETAMINOPHEN 5-325 MG PO TABS: 1.0000 | ORAL_TABLET | ORAL | 0 refills | Status: DC | PRN

## 2017-11-28 NOTE — Discharge Instructions (Signed)
Please follow up with your primary care provider if you are not feeling better over the next couple of weeks.  Return to the ER for any urgent concerns.

## 2017-11-28 NOTE — ED Triage Notes (Signed)
Pt to ED via POV with c/o mechanical fall today with lft rib pain,  Pt states she felt a "pop" with fall. Pt also states she "bumped" her head, pt takes Asprin daily . Pt denies any LOC. Pt ambulatory, A&OX4

## 2017-11-28 NOTE — ED Notes (Addendum)
Spoke with MD Paduchowski about pt , per MD pt appro for flex , no orders given at this time

## 2017-11-28 NOTE — ED Notes (Signed)
Pt states fell on the ice today and c/o left rib pain. xr ordered. Pt states pain with deep breath and movement

## 2017-11-28 NOTE — ED Provider Notes (Signed)
[facility] Emergency Department Provider Note ____________________________________________  Time seen: Approximately 4:28 PM  I have reviewed the triage vital signs and the nursing notes.   HISTORY  Chief Complaint Rib Injury    HPI Morgan [name] is a 66 y.o. female who presents to the emergency department for evaluation and treatment after sustaining a mechanical, non-syncopal fall today.  She struck her left rib and felt a "pop."  She also states that she "bumped" her head, but denies any loss of consciousness, dizziness, nausea, vomiting, or blurred vision.  No alleviating measures have been attempted for this complaint. Past Medical History:  Diagnosis Date  . Allergy   . Anemia   . Anxiety   . Arthritis   . Bipolar 1 disorder ([place])   . Depression   . GERD (gastroesophageal reflux disease)   . Hyperlipidemia   . Hypertension   . MVP (mitral valve prolapse)     Patient Active Problem List   Diagnosis Date Noted  . Right sided weakness 10/04/2017  . HLD (hyperlipidemia) 10/04/2017  . GERD (gastroesophageal reflux disease) 10/04/2017  . Acute respiratory failure ([place])   . Anxiety 08/26/2017  . Sepsis ([place]) 08/26/2017  . HCAP (healthcare-associated pneumonia) 08/26/2017  . Essential hypertension   . Depression   . CAP (community acquired pneumonia) 02/22/2015  . Cough 02/22/2015  . Diarrhea 02/22/2015  . Hypokalemia 02/22/2015  . Tobacco abuse 02/22/2015  . Mass of breast, right 11/18/2013  . Chronic lower back pain 04/29/2013  . Paresthesia 04/29/2013    Past Surgical History:  Procedure Laterality Date  . ABDOMINAL HYSTERECTOMY    . APPENDECTOMY    . BREAST SURGERY Right 1988   tumor removal  . KNEE SURGERY  2005   2 times left  . TUBAL LIGATION      Prior to Admission medications   Medication Sig Start Date End Date Taking? Authorizing Provider  acetaminophen (TYLENOL) 500 MG tablet Take 1,000 mg by mouth every 6 (six) hours  as needed for moderate pain (pain).     [provider]  albuterol (PROVENTIL HFA;VENTOLIN HFA) 108 (90 BASE) MCG/ACT inhaler Inhale 2 puffs into the lungs every 4 (four) hours as needed for wheezing or shortness of breath. 02/24/15   Ghimire, Henreitta Leber, MD  amLODipine (NORVASC) 2.5 MG tablet Take 2.5 mg by mouth daily.    [provider]  ARIPiprazole (ABILIFY) 15 MG tablet Take 15 mg by mouth daily.     [provider]  aspirin EC 325 MG EC tablet Take 1 tablet (325 mg total) by mouth daily. 10/07/17   Dustin Flock, MD  benztropine (COGENTIN) 0.5 MG tablet Take 0.5 mg by mouth 2 (two) times daily.    [provider]  FLUoxetine (PROZAC) 20 MG capsule Take 20 mg by mouth daily.    [provider]  gabapentin (NEURONTIN) 300 MG capsule Take 1 capsule (300 mg total) by mouth 3 (three) times daily as needed. Patient taking differently: Take 300 mg by mouth 3 (three) times daily as needed (pain).  09/12/17   Hillary Bow, MD  HYDROcodone-acetaminophen (NORCO/VICODIN) 5-325 MG tablet Take 1 tablet by mouth every 4 (four) hours as needed for moderate pain. 11/28/17 11/28/18  Aleayah Chico, Dessa Phi, FNP  hydrOXYzine (VISTARIL) 25 MG capsule Take 1 capsule (25 mg total) by mouth 3 (three) times daily as needed for anxiety. Take 25 mg by mouth twice daily and take 50 mg by mouth at bedtime as needed 09/12/17  Hillary Bow, MD  mirtazapine (REMERON) 45 MG tablet Take 45 mg by mouth at bedtime.     [provider]  nabumetone (RELAFEN) 500 MG tablet Take 500 mg by mouth 2 (two) times daily as needed for pain. 10/01/17   [provider]  pantoprazole (PROTONIX) 40 MG tablet Take 40 mg by mouth daily.    [provider]  potassium chloride (K-DUR) 10 MEQ tablet Take 10 mEq by mouth daily.    [provider]    Allergies Demerol [meperidine] and Strawberry (diagnostic)  Family History  Problem Relation Age of Onset  . Breast  cancer Mother   . Ovarian cancer Mother   . Hypertension Father   . Hyperlipidemia Father   . Heart disease Father   . Breast cancer Maternal Grandmother   . Cancer Paternal Grandmother   . Diabetes Paternal Grandmother   . Cancer Paternal Grandfather   . Breast cancer Maternal Aunt   . Breast cancer Maternal Aunt     Social History Social History   Tobacco Use  . Smoking status: Former Smoker    Types: Cigarettes  . Smokeless tobacco: Never Used  Substance Use Topics  . Alcohol use: Yes  . Drug use: No    Review of Systems Constitutional: Positive for fall.  Negative for recent illness or fever. Cardiovascular: Negative for chest pain Respiratory: Negative for shortness of breath Musculoskeletal: Positive for left rib pain. Skin: Negative for open wound or lesion. Neurological: Negative for headache.  Negative for loss of consciousness.  ____________________________________________   PHYSICAL EXAM:  VITAL SIGNS: ED Triage Vitals  Enc Vitals Group     BP 11/28/17 1548 (!) 144/85     Pulse Rate 11/28/17 1548 66     Resp 11/28/17 1548 18     Temp 11/28/17 1548 98.5 F (36.9 C)     Temp Source 11/28/17 1548 Oral     SpO2 11/28/17 1548 96 %     Weight 11/28/17 1548 210 lb (95.3 kg)     Height 11/28/17 1548 6\' 1"  (1.854 m)     Head Circumference --      Peak Flow --      Pain Score 11/28/17 1555 9     Pain Loc --      Pain Edu? --      Excl. in [place]? --     Constitutional: Alert and oriented. Well appearing and in no acute distress. Eyes: Conjunctivae are clear without discharge or drainage Head: Atraumatic Neck: Supple, Nexus criteria is negative. Respiratory: Respirations even and unlabored.  Breath sounds clear to auscultation. Musculoskeletal: Focal tenderness over the lateral seventh rib.  No focal midline tenderness over the length of the spine.  Active range of motion of all extremities is observed Neurologic: Awake, alert, oriented x4. Skin:  Intact Psychiatric: Affect and behavior are appropriate.  ____________________________________________   LABS (all labs ordered are listed, but only abnormal results are displayed)  Labs Reviewed - No data to display ____________________________________________  RADIOLOGY  Left anterior lateral seventh rib is fractured but not displaced. ____________________________________________   PROCEDURES  Procedures  ____________________________________________   INITIAL IMPRESSION / ASSESSMENT AND PLAN / ED COURSE  Morgan [name] is a 66 y.o. female who presents to the emergency department for evaluation and treatment after mechanical, non-syncopal fall.  Potential nondisplaced left seventh rib fracture is consistent with exam.  Patient states that she normally has some hydrocodone at home that helps with her pain, but she  does not have any at this time.  She will be given a prescription and advised to follow-up with her primary care provider for symptoms that are not improving over the next couple of days.  She was given warnings of potential pneumonia if she does not take several deep breaths every hour and move around.  She was encouraged to follow-up with her primary care provider for any new symptoms or return to the emergency department if she is unable to schedule an appointment.  Medications  HYDROcodone-acetaminophen (NORCO/VICODIN) 5-325 MG per tablet 1 tablet (1 tablet Oral Given 11/28/17 1749)    Pertinent labs & imaging results that were available during my care of the patient were reviewed by me and considered in my medical decision making (see chart for details).  _________________________________________   FINAL CLINICAL IMPRESSION(S) / ED DIAGNOSES  Final diagnoses:  Closed fracture of one rib of left side, initial encounter    ED Discharge Orders        Ordered    HYDROcodone-acetaminophen (NORCO/VICODIN) 5-325 MG tablet  Every 4 hours PRN     11/28/17 1734        If controlled substance prescribed during this visit, 12 month history viewed on the [facility] prior to issuing an initial prescription for Schedule II or III opiod.    Victorino Dike, FNP 11/28/17 1826    Nance Pear, MD 11/28/17 [phone]

## 2017-12-20 ENCOUNTER — Emergency Department (HOSPITAL_COMMUNITY)
Admission: EM | Admit: 2017-12-20 | Discharge: 2017-12-20 | Disposition: A | Discharge status: Home / Self Care | Payer: Medicare HMO | Attending: Emergency Medicine | Admitting: Emergency Medicine

## 2017-12-20 ENCOUNTER — Emergency Department (HOSPITAL_COMMUNITY): Payer: Medicare HMO

## 2017-12-20 ENCOUNTER — Encounter (HOSPITAL_COMMUNITY): Payer: Self-pay

## 2017-12-20 ENCOUNTER — Other Ambulatory Visit: Payer: Self-pay

## 2017-12-20 DIAGNOSIS — M545 Low back pain: Secondary | ICD-10-CM | POA: Insufficient documentation

## 2017-12-20 DIAGNOSIS — M25531 Pain in right wrist: Secondary | ICD-10-CM | POA: Diagnosis not present

## 2017-12-20 DIAGNOSIS — Y929 Unspecified place or not applicable: Secondary | ICD-10-CM | POA: Diagnosis not present

## 2017-12-20 DIAGNOSIS — I10 Essential (primary) hypertension: Secondary | ICD-10-CM | POA: Insufficient documentation

## 2017-12-20 DIAGNOSIS — S62231A Other displaced fracture of base of first metacarpal bone, right hand, initial encounter for closed fracture: Secondary | ICD-10-CM | POA: Diagnosis not present

## 2017-12-20 DIAGNOSIS — Z79899 Other long term (current) drug therapy: Secondary | ICD-10-CM | POA: Insufficient documentation

## 2017-12-20 DIAGNOSIS — Y999 Unspecified external cause status: Secondary | ICD-10-CM | POA: Insufficient documentation

## 2017-12-20 DIAGNOSIS — Z87891 Personal history of nicotine dependence: Secondary | ICD-10-CM | POA: Diagnosis not present

## 2017-12-20 DIAGNOSIS — Z7982 Long term (current) use of aspirin: Secondary | ICD-10-CM | POA: Diagnosis not present

## 2017-12-20 DIAGNOSIS — Y939 Activity, unspecified: Secondary | ICD-10-CM | POA: Diagnosis not present

## 2017-12-20 DIAGNOSIS — S6991XA Unspecified injury of right wrist, hand and finger(s), initial encounter: Secondary | ICD-10-CM | POA: Diagnosis present

## 2017-12-20 MED ORDER — HYDROCODONE-ACETAMINOPHEN 5-325 MG PO TABS: 1.0000 | ORAL_TABLET | Freq: Four times a day (QID) | ORAL | 0 refills | Status: AC | PRN

## 2017-12-20 MED ORDER — OXYCODONE-ACETAMINOPHEN 5-325 MG PO TABS
1.0000 | ORAL_TABLET | Freq: Once | ORAL | Status: AC
  Administered 2017-12-20: 1 via ORAL
  Filled 2017-12-20: qty 1

## 2017-12-20 NOTE — ED Notes (Signed)
Dorothea Ogle PA assessed pt in triage.
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
## 2017-12-20 NOTE — ED Provider Notes (Signed)
Patient seen and evaluated. Fractured thumb metacarpal, o/w Negative studies. F/u arranged with hand surgery arranged. Splint appoprriate.    Tanna Furry, MD 12/20/17 2123

## 2017-12-20 NOTE — ED Notes (Signed)
Ortho tech at bedside 

## 2017-12-20 NOTE — ED Notes (Signed)
Ortho tech paged  
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
## 2017-12-20 NOTE — ED Notes (Signed)
Patient transported to X-ray 

## 2017-12-20 NOTE — Progress Notes (Signed)
Orthopedic Tech Progress Note Patient Details:  Morgan [name] [birth year] [id number]  Ortho Devices Type of Ortho Device: Thumb spica splint, Arm sling Splint Material: Fiberglass Ortho Device/Splint Location: Right Ortho Device/Splint Interventions: Application   Post Interventions Patient Tolerated: Well Instructions Provided: Care of device, Adjustment of device   Kristopher Oppenheim 12/20/2017, 9:28 PM

## 2018-01-09 ENCOUNTER — Emergency Department
Admission: EM | Admit: 2018-01-09 | Discharge: 2018-01-09 | Disposition: A | Discharge status: Home / Self Care | Payer: Medicare HMO | Attending: Emergency Medicine | Admitting: Emergency Medicine

## 2018-01-09 ENCOUNTER — Encounter: Payer: Self-pay | Admitting: Emergency Medicine

## 2018-01-09 ENCOUNTER — Emergency Department: Payer: Medicare HMO

## 2018-01-09 DIAGNOSIS — R0789 Other chest pain: Secondary | ICD-10-CM | POA: Diagnosis not present

## 2018-01-09 DIAGNOSIS — Z79899 Other long term (current) drug therapy: Secondary | ICD-10-CM | POA: Diagnosis not present

## 2018-01-09 DIAGNOSIS — R079 Chest pain, unspecified: Secondary | ICD-10-CM

## 2018-01-09 DIAGNOSIS — I509 Heart failure, unspecified: Secondary | ICD-10-CM | POA: Insufficient documentation

## 2018-01-09 DIAGNOSIS — R0602 Shortness of breath: Secondary | ICD-10-CM | POA: Diagnosis not present

## 2018-01-09 DIAGNOSIS — Z87891 Personal history of nicotine dependence: Secondary | ICD-10-CM | POA: Diagnosis not present

## 2018-01-09 DIAGNOSIS — Z7982 Long term (current) use of aspirin: Secondary | ICD-10-CM | POA: Insufficient documentation

## 2018-01-09 DIAGNOSIS — I110 Hypertensive heart disease with heart failure: Secondary | ICD-10-CM | POA: Insufficient documentation

## 2018-01-09 LAB — CBC
HEMATOCRIT: 34 % — AB (ref 35.0–47.0)
Hemoglobin: 11.3 g/dL — ABNORMAL LOW (ref 12.0–16.0)
MCH: 26.3 pg (ref 26.0–34.0)
MCHC: 33.3 g/dL (ref 32.0–36.0)
MCV: 79.2 fL — AB (ref 80.0–100.0)
Platelets: 379 10*3/uL (ref 150–440)
RBC: 4.3 MIL/uL (ref 3.80–5.20)
RDW: 13.9 % (ref 11.5–14.5)
WBC: 6.9 10*3/uL (ref 3.6–11.0)

## 2018-01-09 LAB — BASIC METABOLIC PANEL
Anion gap: 11 (ref 5–15)
BUN: 13 mg/dL (ref 6–20)
CHLORIDE: 103 mmol/L (ref 101–111)
CO2: 24 mmol/L (ref 22–32)
Calcium: 9.2 mg/dL (ref 8.9–10.3)
Creatinine, Ser: 0.89 mg/dL (ref 0.44–1.00)
Glucose, Bld: 122 mg/dL — ABNORMAL HIGH (ref 65–99)
POTASSIUM: 2.9 mmol/L — AB (ref 3.5–5.1)
SODIUM: 138 mmol/L (ref 135–145)

## 2018-01-09 LAB — TROPONIN I: Troponin I: <0.03 ng/mL (ref <0.03)

## 2018-01-09 MED ORDER — ONDANSETRON HCL 4 MG/2ML IJ SOLN
INTRAMUSCULAR | Status: AC
  Administered 2018-01-09: 4 mg via INTRAVENOUS
  Filled 2018-01-09: qty 2

## 2018-01-09 MED ORDER — FUROSEMIDE 10 MG/ML IJ SOLN
60.0000 mg | Freq: Once | INTRAMUSCULAR | Status: AC
  Administered 2018-01-09: 60 mg via INTRAVENOUS
  Filled 2018-01-09: qty 8

## 2018-01-09 MED ORDER — POTASSIUM CHLORIDE CRYS ER 20 MEQ PO TBCR
40.0000 meq | EXTENDED_RELEASE_TABLET | Freq: Once | ORAL | Status: AC
  Administered 2018-01-09: 40 meq via ORAL
  Filled 2018-01-09: qty 2

## 2018-01-09 MED ORDER — ONDANSETRON HCL 4 MG/2ML IJ SOLN
4.0000 mg | Freq: Once | INTRAMUSCULAR | Status: AC
  Administered 2018-01-09: 4 mg via INTRAVENOUS

## 2018-01-09 NOTE — ED Notes (Signed)
Spoke with pt about wait times and what to expect next. Advised pt that I am available for further questions if needed.  

## 2018-01-09 NOTE — ED Notes (Signed)

## 2018-01-09 NOTE — ED Notes (Signed)
Pt offered toiletry and pt ambulated to toilet with little to no assistance
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
## 2018-01-09 NOTE — ED Triage Notes (Addendum)
Pt comes into the ED via POV c/o shortness of breath, chest pain, and weakness.  Patient states this started yesterday but denies any cough or fevers.  Patient explains that she has been nauseas but with no emesis present.  Patient has increased breathing at this moment but can slow it down when asked to or when patient is speaking.  Patient is a CHF patient and states this feels similar to when she has issues with her CHF.  Denies being able to weigh herself recently.

## 2018-01-14 DIAGNOSIS — J449 Chronic obstructive pulmonary disease, unspecified: Secondary | ICD-10-CM | POA: Diagnosis present

## 2018-01-22 ENCOUNTER — Encounter: Payer: Self-pay | Admitting: Gastroenterology

## 2018-01-23 ENCOUNTER — Encounter: Payer: Self-pay | Admitting: Gastroenterology

## 2018-03-17 ENCOUNTER — Ambulatory Visit: Payer: Medicare HMO | Admitting: Gastroenterology

## 2018-05-02 ENCOUNTER — Encounter: Payer: Self-pay | Admitting: Gastroenterology

## 2018-05-02 ENCOUNTER — Ambulatory Visit: Payer: Medicare HMO | Admitting: Gastroenterology

## 2018-05-02 ENCOUNTER — Encounter (INDEPENDENT_AMBULATORY_CARE_PROVIDER_SITE_OTHER): Payer: Self-pay

## 2018-05-02 VITALS — BP 140/80 | HR 80 | Ht 70.5 in | Wt 237.0 lb

## 2018-05-02 DIAGNOSIS — D638 Anemia in other chronic diseases classified elsewhere: Secondary | ICD-10-CM | POA: Diagnosis not present

## 2018-05-02 DIAGNOSIS — D508 Other iron deficiency anemias: Secondary | ICD-10-CM

## 2018-05-02 DIAGNOSIS — R197 Diarrhea, unspecified: Secondary | ICD-10-CM | POA: Diagnosis not present

## 2018-05-02 DIAGNOSIS — K219 Gastro-esophageal reflux disease without esophagitis: Secondary | ICD-10-CM | POA: Diagnosis not present

## 2018-05-02 MED ORDER — COLESTIPOL HCL 1 G PO TABS: 1.0000 g | ORAL_TABLET | Freq: Two times a day (BID) | ORAL | 3 refills | Status: DC

## 2018-05-02 MED ORDER — ESOMEPRAZOLE MAGNESIUM 40 MG PO CPDR: 40.0000 mg | DELAYED_RELEASE_CAPSULE | Freq: Every day | ORAL | 3 refills | Status: DC

## 2018-05-02 MED ORDER — NA SULFATE-K SULFATE-MG SULF 17.5-3.13-1.6 GM/177ML PO SOLN: 1.0000 | Freq: Once | ORAL | 0 refills | Status: AC

## 2018-05-02 NOTE — Progress Notes (Signed)
Morgan [name]    [id number]    [birth year]  Primary Care Physician:[name], [name], MD  Referring Physician: Javier Docker, MD [address] [place], [place] [address]  Chief complaint: Chronic microcytic anemia, GERD HPI: 67 year old female remotely followed by Dr. Olevia Perches, last seen in 2012 is here to establish care. She has chronic microcytic anemia.  Based on labs from January 09, 2018 hemoglobin 11.3, hematocrit 34, MCV 79, platelets 379.  She has chronic anemia, hemoglobin ranging from 10-11 going back to 2008 EGD January 2009 showed pyloric stenosis can cause gastric antrum, biopsies were obtained showed benign antral mucosa with no dysplasia, intestinal metaplasia or malignancy. Colonoscopy January 2009 showed sigmoid and descending colon diverticulosis and small internal hemorrhoids Chronic diarrhea with 2-3 semi-formed bowel movements daily.  Avoids milk and dairy products.  She drinks 2 to 3 glasses of lemonade daily.  Denies any abdominal pain or melena or bright red blood per rectum. She has CHF and mitral regurgitation with EF 40-45%.  She is currently taking Lasix daily.  Patient reports significant weight gain since surgery and has been gaining being almost 10 pounds every month. She has follow up appointment with Cardiology History of chronic GERD with breakthrough heartburn once or twice a week, takes baking soda when she gets breakthrough symptoms.  She is currently on Protonix once daily, previously was on omeprazole, feels Protonix is working better No dysphagia, melena, bright red blood per rectum or epigastric abdominal pain   Outpatient Encounter Medications as of 05/02/2018  Medication Sig  . acetaminophen (TYLENOL) 500 MG tablet Take 1,000 mg by mouth every 6 (six) hours as needed for moderate pain (pain).   Marland Kitchen albuterol (PROVENTIL HFA;VENTOLIN HFA) 108 (90 BASE) MCG/ACT inhaler Inhale 2 puffs into the lungs every 4 (four) hours as needed  for wheezing or shortness of breath.  Marland Kitchen amLODipine (NORVASC) 2.5 MG tablet Take 2.5 mg by mouth daily.  . ARIPiprazole (ABILIFY) 15 MG tablet Take 15 mg by mouth daily.   Marland Kitchen aspirin EC 325 MG EC tablet Take 1 tablet (325 mg total) by mouth daily.  . benztropine (COGENTIN) 0.5 MG tablet Take 0.5 mg by mouth 2 (two) times daily.  Marland Kitchen FLUoxetine (PROZAC) 20 MG capsule Take 20 mg by mouth daily.  Marland Kitchen gabapentin (NEURONTIN) 300 MG capsule Take 1 capsule (300 mg total) by mouth 3 (three) times daily as needed. (Patient taking differently: Take 300 mg by mouth 3 (three) times daily as needed (pain). )  . hydrOXYzine (VISTARIL) 25 MG capsule Take 1 capsule (25 mg total) by mouth 3 (three) times daily as needed for anxiety. Take 25 mg by mouth twice daily and take 50 mg by mouth at bedtime as needed  . mirtazapine (REMERON) 45 MG tablet Take 45 mg by mouth at bedtime.   . nabumetone (RELAFEN) 500 MG tablet Take 500 mg by mouth 2 (two) times daily as needed for pain.  . pantoprazole (PROTONIX) 40 MG tablet Take 40 mg by mouth daily.  . potassium chloride (K-DUR) 10 MEQ tablet Take 10 mEq by mouth daily.   No facility-administered encounter medications on file as of 05/02/2018.     Allergies as of 05/02/2018 - Review Complete 05/02/2018  Allergen Reaction Noted  . Demerol [meperidine] Hives 04/28/2013  . Strawberry (diagnostic) Hives 08/26/2017    Past Medical History:  Diagnosis Date  . Allergy   . Anemia   . Anxiety   .  Arthritis   . Bipolar 1 disorder ([place])   . Depression   . GERD (gastroesophageal reflux disease)   . Hyperlipidemia   . Hypertension   . MVP (mitral valve prolapse)     Past Surgical History:  Procedure Laterality Date  . ABDOMINAL HYSTERECTOMY    . APPENDECTOMY    . BREAST SURGERY Right 1988   tumor removal  . KNEE SURGERY  2005   2 times left  . TUBAL LIGATION      Family History  Problem Relation Age of Onset  . Breast cancer Mother   . Ovarian cancer Mother   .  Hypertension Father   . Hyperlipidemia Father   . Heart disease Father   . Breast cancer Maternal Grandmother   . Cancer Paternal Grandmother   . Diabetes Paternal Grandmother   . Cancer Paternal Grandfather   . Breast cancer Maternal Aunt   . Breast cancer Maternal Aunt     Social History   Socioeconomic History  . Marital status: Single    Spouse name: Not on file  . Number of children: Not on file  . Years of education: Not on file  . Highest education level: Not on file  Occupational History  . Not on file  Social Needs  . Financial resource strain: Not on file  . Food insecurity:    Worry: Not on file    Inability: Not on file  . Transportation needs:    Medical: Not on file    Non-medical: Not on file  Tobacco Use  . Smoking status: Former Smoker    Types: Cigarettes  . Smokeless tobacco: Never Used  Substance and Sexual Activity  . Alcohol use: Yes  . Drug use: No  . Sexual activity: Not on file  Lifestyle  . Physical activity:    Days per week: Not on file    Minutes per session: Not on file  . Stress: Not on file  Relationships  . Social connections:    Talks on phone: Not on file    Gets together: Not on file    Attends religious service: Not on file    Active member of club or organization: Not on file    Attends meetings of clubs or organizations: Not on file    Relationship status: Not on file  . Intimate partner violence:    Fear of current or ex partner: Not on file    Emotionally abused: Not on file    Physically abused: Not on file    Forced sexual activity: Not on file  Other Topics Concern  . Not on file  Social History Narrative  . Not on file      Review of systems: Review of Systems  Constitutional: Negative for fever and chills.  HENT: Negative.   Eyes: Negative for blurred vision.  Respiratory: Negative for cough, shortness of breath and wheezing.   Cardiovascular: Negative for chest pain and palpitations.    Gastrointestinal: as per HPI Genitourinary: Negative for dysuria, urgency, frequency and hematuria.  Musculoskeletal: Negative for myalgias, back pain and joint pain.  Skin: Negative for itching and rash.  Neurological: Negative for dizziness, tremors, focal weakness, seizures and loss of consciousness.  Endo/Heme/Allergies: Negative for seasonal allergies.  Psychiatric/Behavioral: Negative for suicidal ideas and hallucinations.  Positive for anxiety and depression All other systems reviewed and are negative.   Physical Exam: Vitals:   05/02/18 0938  BP: 140/80  Pulse: 80   Body mass index is 33.53  kg/m. Gen:      No acute distress HEENT:  EOMI, sclera anicteric Neck:     No masses; no thyromegaly Lungs:    Clear to auscultation bilaterally; normal respiratory effort CV:         Regular rate and rhythm; no murmurs Abd:      + bowel sounds; soft, non-tender; no palpable masses, no distension Ext:    + edema; adequate peripheral perfusion Skin:      Warm and dry; no rash Neuro: alert and oriented x 3 Psych: normal mood and affect  Data Reviewed:  Reviewed labs, radiology imaging, old records and pertinent past GI work up   Assessment and Plan/Recommendations:  67 year old female with history of CAD, CHF (EF 40-45%), mitral valve regurgitation, cva, chronic microcytic anemia Due for colorectal cancer screening, last colonoscopy January 2009.  She has history of peptic ulcer disease. Scheduled for EGD and colonoscopy to evaluate chronic microcytic anemia and exclude GI blood loss Avoid NSAIDs  Chronic intermittent diarrhea: Likely secondary to diet Advised patient to decrease the amount of lemonade, artificial sweeteners or fructose drinks Start Colestid 1 g twice daily  GERD: We will switch to Nexium 40 mg daily Antireflux measures and lifestyle modification  The risks and benefits as well as alternatives of endoscopic procedure(s) have been discussed and reviewed. All  questions answered. The patient agrees to proceed.    Damaris Hippo , MD [phone]    CC: [name], [name], MD

## 2018-05-02 NOTE — Patient Instructions (Signed)
You have been scheduled for an endoscopy and colonoscopy. Please follow the written instructions given to you at your visit today. Please pick up your prep supplies at the pharmacy within the next 1-3 days. If you use inhalers (even only as needed), please bring them with you on the day of your procedure. Your physician has requested that you go to www.startemmi.com and enter the access code given to you at your visit today. This web site gives a general overview about your procedure. However, you should still follow specific instructions given to you by our office regarding your preparation for the procedure.  We have sent Nexium and Colestid to your pharmacy  If you are age 75 or older, your body mass index should be between 23-30. Your Body mass index is 33.53 kg/m. If this is out of the aforementioned range listed, please consider follow up with your Primary Care Provider.  If you are age 22 or younger, your body mass index should be between 19-25. Your Body mass index is 33.53 kg/m. If this is out of the aformentioned range listed, please consider follow up with your Primary Care Provider.

## 2018-05-05 ENCOUNTER — Encounter: Payer: Self-pay | Admitting: Emergency Medicine

## 2018-05-05 ENCOUNTER — Emergency Department
Admission: EM | Admit: 2018-05-05 | Discharge: 2018-05-05 | Disposition: A | Discharge status: Home / Self Care | Payer: Medicare HMO | Attending: Emergency Medicine | Admitting: Emergency Medicine

## 2018-05-05 ENCOUNTER — Other Ambulatory Visit: Payer: Self-pay

## 2018-05-05 ENCOUNTER — Emergency Department: Payer: Medicare HMO

## 2018-05-05 DIAGNOSIS — Z79899 Other long term (current) drug therapy: Secondary | ICD-10-CM | POA: Diagnosis not present

## 2018-05-05 DIAGNOSIS — R0789 Other chest pain: Secondary | ICD-10-CM | POA: Insufficient documentation

## 2018-05-05 DIAGNOSIS — R0602 Shortness of breath: Secondary | ICD-10-CM | POA: Diagnosis present

## 2018-05-05 DIAGNOSIS — I10 Essential (primary) hypertension: Secondary | ICD-10-CM | POA: Diagnosis not present

## 2018-05-05 LAB — BASIC METABOLIC PANEL
Anion gap: 9 (ref 5–15)
BUN: 13 mg/dL (ref 6–20)
CO2: 31 mmol/L (ref 22–32)
CREATININE: 0.85 mg/dL (ref 0.44–1.00)
Calcium: 8.7 mg/dL — ABNORMAL LOW (ref 8.9–10.3)
Chloride: 99 mmol/L — ABNORMAL LOW (ref 101–111)
GFR calc Af Amer: >60 mL/min (ref >60)
GLUCOSE: 94 mg/dL (ref 65–99)
POTASSIUM: 3.1 mmol/L — AB (ref 3.5–5.1)
Sodium: 139 mmol/L (ref 135–145)

## 2018-05-05 LAB — CBC
HEMATOCRIT: 32.5 % — AB (ref 35.0–47.0)
Hemoglobin: 10.6 g/dL — ABNORMAL LOW (ref 12.0–16.0)
MCH: 26.1 pg (ref 26.0–34.0)
MCHC: 32.8 g/dL (ref 32.0–36.0)
MCV: 79.5 fL — ABNORMAL LOW (ref 80.0–100.0)
Platelets: 320 10*3/uL (ref 150–440)
RBC: 4.09 MIL/uL (ref 3.80–5.20)
RDW: 14.9 % — AB (ref 11.5–14.5)
WBC: 6.9 10*3/uL (ref 3.6–11.0)

## 2018-05-05 LAB — TROPONIN I: Troponin I: <0.03 ng/mL (ref <0.03)

## 2018-05-05 MED ORDER — IOPAMIDOL (ISOVUE-370) INJECTION 76%
75.0000 mL | Freq: Once | INTRAVENOUS | Status: AC | PRN
  Administered 2018-05-05: 75 mL via INTRAVENOUS

## 2018-05-05 MED ORDER — FUROSEMIDE 10 MG/ML IJ SOLN
60.0000 mg | Freq: Once | INTRAMUSCULAR | Status: AC
  Administered 2018-05-05: 60 mg via INTRAVENOUS
  Filled 2018-05-05: qty 8

## 2018-05-05 NOTE — ED Provider Notes (Signed)
[facility] Emergency Department Provider Note   ____________________________________________    I have reviewed the triage vital signs and the nursing notes.   HISTORY  Chief Complaint Chest Pain and Leg Swelling     HPI Morgan [name] is a 67 y.o. female who presents with complaints of shortness of breath and some chest tightness.  Patient reports over the last week she is gained 13 pounds, she has increased her Lasix to 80 mg daily but reports over the last 24 hours she is felt somewhat short of breath and has had central chest tightness.  Denies fevers, intermittent cough noted.  No recent travel.  No calf pain.  No nausea vomiting or diaphoresis  Past Medical History:  Diagnosis Date  . Allergy   . Anemia   . Anxiety   . Arthritis   . Bipolar 1 disorder ([place])   . Depression   . GERD (gastroesophageal reflux disease)   . Hyperlipidemia   . Hypertension   . MVP (mitral valve prolapse)     Patient Active Problem List   Diagnosis Date Noted  . Right sided weakness 10/04/2017  . HLD (hyperlipidemia) 10/04/2017  . GERD (gastroesophageal reflux disease) 10/04/2017  . Acute respiratory failure ([place])   . Anxiety 08/26/2017  . Sepsis ([place]) 08/26/2017  . HCAP (healthcare-associated pneumonia) 08/26/2017  . Essential hypertension   . Depression   . CAP (community acquired pneumonia) 02/22/2015  . Cough 02/22/2015  . Diarrhea 02/22/2015  . Hypokalemia 02/22/2015  . Tobacco abuse 02/22/2015  . Mass of breast, right 11/18/2013  . Chronic lower back pain 04/29/2013  . Paresthesia 04/29/2013    Past Surgical History:  Procedure Laterality Date  . ABDOMINAL HYSTERECTOMY    . APPENDECTOMY    . BREAST SURGERY Right 1988   tumor removal  . KNEE SURGERY  2005   2 times left  . TUBAL LIGATION      Prior to Admission medications   Medication Sig Start Date End Date Taking? Authorizing Provider  acetaminophen (TYLENOL) 500 MG tablet Take  1,000 mg by mouth every 6 (six) hours as needed for moderate pain (pain).     [provider]  albuterol (PROVENTIL HFA;VENTOLIN HFA) 108 (90 BASE) MCG/ACT inhaler Inhale 2 puffs into the lungs every 4 (four) hours as needed for wheezing or shortness of breath. 02/24/15   Ghimire, Henreitta Leber, MD  amLODipine (NORVASC) 2.5 MG tablet Take 2.5 mg by mouth daily.    [provider]  ARIPiprazole (ABILIFY) 15 MG tablet Take 15 mg by mouth daily.     [provider]  aspirin EC 325 MG EC tablet Take 1 tablet (325 mg total) by mouth daily. 10/07/17   Dustin Flock, MD  benztropine (COGENTIN) 0.5 MG tablet Take 0.5 mg by mouth 2 (two) times daily.    [provider]  colestipol (COLESTID) 1 g tablet Take 1 tablet (1 g total) by mouth 2 (two) times daily. 05/02/18   Mauri Pole, MD  esomeprazole (NEXIUM) 40 MG capsule Take 1 capsule (40 mg total) by mouth daily at 12 noon. 05/02/18   Mauri Pole, MD  FLUoxetine (PROZAC) 20 MG capsule Take 20 mg by mouth daily.    [provider]  gabapentin (NEURONTIN) 300 MG capsule Take 1 capsule (300 mg total) by mouth 3 (three) times daily as needed. Patient taking differently: Take 300 mg by mouth 3 (three) times daily as needed (pain).  09/12/17  Hillary Bow, MD  hydrOXYzine (VISTARIL) 25 MG capsule Take 1 capsule (25 mg total) by mouth 3 (three) times daily as needed for anxiety. Take 25 mg by mouth twice daily and take 50 mg by mouth at bedtime as needed 09/12/17   Hillary Bow, MD  mirtazapine (REMERON) 45 MG tablet Take 45 mg by mouth at bedtime.     [provider]  nabumetone (RELAFEN) 500 MG tablet Take 500 mg by mouth 2 (two) times daily as needed for pain. 10/01/17   [provider]  pantoprazole (PROTONIX) 40 MG tablet Take 40 mg by mouth daily.    [provider]  potassium chloride (K-DUR) 10 MEQ tablet Take 10 mEq by mouth daily.    [provider]      Allergies Demerol [meperidine] and Strawberry (diagnostic)  Family History  Problem Relation Age of Onset  . Breast cancer Mother   . Ovarian cancer Mother   . Hypertension Father   . Hyperlipidemia Father   . Heart disease Father   . Breast cancer Maternal Grandmother   . Cancer Paternal Grandmother   . Diabetes Paternal Grandmother   . Cancer Paternal Grandfather   . Breast cancer Maternal Aunt   . Breast cancer Maternal Aunt     Social History Social History   Tobacco Use  . Smoking status: Former Smoker    Types: Cigarettes  . Smokeless tobacco: Never Used  Substance Use Topics  . Alcohol use: Yes  . Drug use: No    Review of Systems  Constitutional: No fever/chills Eyes: No visual changes.  ENT: No sore throat. Cardiovascular: As above Respiratory: As above Gastrointestinal: No abdominal pain.  No nausea, no vomiting.   Genitourinary: Negative for dysuria. Musculoskeletal: Negative for back pain. Skin: Negative for rash. Neurological: Negative for headaches    ____________________________________________   PHYSICAL EXAM:  VITAL SIGNS: ED Triage Vitals  Enc Vitals Group     BP 05/05/18 1244 (!) 152/82     Pulse Rate 05/05/18 1244 69     Resp 05/05/18 1244 18     Temp 05/05/18 1244 98.5 F (36.9 C)     Temp Source 05/05/18 1244 Oral     SpO2 05/05/18 1244 99 %     Weight 05/05/18 1248 106.7 kg (235 lb 3.2 oz)     Height 05/05/18 1248 1.854 m (6\' 1" )     Head Circumference --      Peak Flow --      Pain Score 05/05/18 1244 7     Pain Loc --      Pain Edu? --      Excl. in [place]? --     Constitutional: Alert and oriented. . Pleasant and interactive Eyes: Conjunctivae are normal.   Nose: No congestion/rhinnorhea. Mouth/Throat: Mucous membranes are moist.   Neck:  Painless ROM Cardiovascular: Normal rate, regular rhythm. Good peripheral circulation. Respiratory: Normal respiratory effort.  No retractions. Lungs CTAB. Gastrointestinal: Soft  and nontender. No distention.   Genitourinary: deferred Musculoskeletal: Mild lower extremity edema, no calf tenderness warm and well perfused Neurologic:  Normal speech and language. No gross focal neurologic deficits are appreciated.  Skin:  Skin is warm, dry and intact. No rash noted. Psychiatric: Mood and affect are normal. Speech and behavior are normal.  ____________________________________________   LABS (all labs ordered are listed, but only abnormal results are displayed)  Labs Reviewed  BASIC METABOLIC PANEL - Abnormal; Notable for the following components:  Result Value   Potassium 3.1 (*)    Chloride 99 (*)    Calcium 8.7 (*)    All other components within normal limits  CBC - Abnormal; Notable for the following components:   Hemoglobin 10.6 (*)    HCT 32.5 (*)    MCV 79.5 (*)    RDW 14.9 (*)    All other components within normal limits  TROPONIN I  TROPONIN I   ____________________________________________  EKG  ED ECG REPORT I, Lavonia Drafts, the attending physician, personally viewed and interpreted this ECG.  Date: 05/05/2018   Rhythm: normal sinus rhythm QRS Axis: normal Intervals: normal ST/T Wave abnormalities: Nonspecific ST changes Narrative Interpretation: no evidence of acute ischemia  ____________________________________________  RADIOLOGY  Chest x-ray demonstrates possible infiltrate versus atelectasis, minimal edema CT scan negative for pneumonia or PE, discussed aneurysmal dilatation with the patient and the need for annual follow-up ____________________________________________   PROCEDURES  Procedure(s) performed: No  Procedures   Critical Care performed: No ____________________________________________   INITIAL IMPRESSION / ASSESSMENT AND PLAN / ED COURSE  Pertinent labs & imaging results that were available during my care of the patient were reviewed by me and considered in my medical decision making (see chart for  details).  Patient with a history of CHF presents with weight gain, mild shortness of breath, cough as well as chest pressure.  Lab work is overall reassuring, chest x-ray suspicious for atelectasis versus infiltrate.  Also possibility of mild edema.  Will send for CT of the chest to further delineate x-ray findings.  Patient treated with IV Lasix with good response  Imaging is reassuring.  Lab work is unremarkable.  Troponin is negative x2.  ----------------------------------------- 6:42 PM on 05/05/2018 -----------------------------------------  Patient felt much better after treatment, ambulatory pulse ox normal.  No shortness of breath.  No further chest pain, has follow-up with PCP arranged.  Okay for discharge, return precautions discussed    ____________________________________________   FINAL CLINICAL IMPRESSION(S) / ED DIAGNOSES  Final diagnoses:  Atypical chest pain        Note:  This document was prepared using Dragon voice recognition software and may include unintentional dictation errors.    Lavonia Drafts, MD 05/05/18 [phone]

## 2018-05-05 NOTE — ED Notes (Signed)
Pt requesting to have monitors removed due to administration on lasix and the need to get up to toilet

## 2018-05-05 NOTE — ED Triage Notes (Signed)
Pt here with c/o mid sternal to right sided cp that began yesterday while she was resting at home, states hx of tia's and chf, states a 13lb weight gain in the past week, called Dr and increased Lasix to 80mg  once a day.  States her feet and ankles look "really good today," 1+ pedal and ble edema noted.  Pt states cp feels like pressure, radiates to left shoulder and neck. Appears in NAD at this time.

## 2018-05-08 DIAGNOSIS — I7789 Other specified disorders of arteries and arterioles: Secondary | ICD-10-CM | POA: Insufficient documentation

## 2018-05-28 ENCOUNTER — Encounter: Payer: Medicare HMO | Admitting: Gastroenterology

## 2018-07-28 ENCOUNTER — Other Ambulatory Visit: Payer: Self-pay | Admitting: Gastroenterology

## 2018-08-22 ENCOUNTER — Encounter: Payer: Self-pay | Admitting: Emergency Medicine

## 2018-08-22 ENCOUNTER — Emergency Department: Payer: Medicare HMO

## 2018-08-22 ENCOUNTER — Other Ambulatory Visit: Payer: Self-pay | Admitting: Specialist

## 2018-08-22 ENCOUNTER — Observation Stay: Admit: 2018-08-22 | Payer: Medicare HMO

## 2018-08-22 ENCOUNTER — Observation Stay
Admission: EM | Admit: 2018-08-22 | Discharge: 2018-08-24 | Disposition: A | Discharge status: Home / Self Care | Payer: Medicare HMO | Attending: Internal Medicine | Admitting: Internal Medicine

## 2018-08-22 ENCOUNTER — Other Ambulatory Visit: Payer: Self-pay

## 2018-08-22 DIAGNOSIS — Z720 Tobacco use: Secondary | ICD-10-CM | POA: Insufficient documentation

## 2018-08-22 DIAGNOSIS — I341 Nonrheumatic mitral (valve) prolapse: Secondary | ICD-10-CM | POA: Insufficient documentation

## 2018-08-22 DIAGNOSIS — Z8673 Personal history of transient ischemic attack (TIA), and cerebral infarction without residual deficits: Secondary | ICD-10-CM | POA: Insufficient documentation

## 2018-08-22 DIAGNOSIS — Z23 Encounter for immunization: Secondary | ICD-10-CM | POA: Diagnosis not present

## 2018-08-22 DIAGNOSIS — Z1231 Encounter for screening mammogram for malignant neoplasm of breast: Secondary | ICD-10-CM

## 2018-08-22 DIAGNOSIS — X58XXXA Exposure to other specified factors, initial encounter: Secondary | ICD-10-CM | POA: Insufficient documentation

## 2018-08-22 DIAGNOSIS — E876 Hypokalemia: Secondary | ICD-10-CM | POA: Insufficient documentation

## 2018-08-22 DIAGNOSIS — I340 Nonrheumatic mitral (valve) insufficiency: Secondary | ICD-10-CM | POA: Insufficient documentation

## 2018-08-22 DIAGNOSIS — M1990 Unspecified osteoarthritis, unspecified site: Secondary | ICD-10-CM | POA: Insufficient documentation

## 2018-08-22 DIAGNOSIS — I491 Atrial premature depolarization: Secondary | ICD-10-CM | POA: Insufficient documentation

## 2018-08-22 DIAGNOSIS — R4702 Dysphasia: Secondary | ICD-10-CM | POA: Diagnosis not present

## 2018-08-22 DIAGNOSIS — R079 Chest pain, unspecified: Secondary | ICD-10-CM

## 2018-08-22 DIAGNOSIS — Z79899 Other long term (current) drug therapy: Secondary | ICD-10-CM | POA: Insufficient documentation

## 2018-08-22 DIAGNOSIS — G43909 Migraine, unspecified, not intractable, without status migrainosus: Secondary | ICD-10-CM | POA: Insufficient documentation

## 2018-08-22 DIAGNOSIS — G43809 Other migraine, not intractable, without status migrainosus: Principal | ICD-10-CM | POA: Insufficient documentation

## 2018-08-22 DIAGNOSIS — Z7982 Long term (current) use of aspirin: Secondary | ICD-10-CM | POA: Insufficient documentation

## 2018-08-22 DIAGNOSIS — F319 Bipolar disorder, unspecified: Secondary | ICD-10-CM | POA: Insufficient documentation

## 2018-08-22 DIAGNOSIS — S2222XA Fracture of body of sternum, initial encounter for closed fracture: Secondary | ICD-10-CM | POA: Diagnosis not present

## 2018-08-22 DIAGNOSIS — E785 Hyperlipidemia, unspecified: Secondary | ICD-10-CM | POA: Diagnosis not present

## 2018-08-22 DIAGNOSIS — N6489 Other specified disorders of breast: Secondary | ICD-10-CM | POA: Diagnosis not present

## 2018-08-22 DIAGNOSIS — G8929 Other chronic pain: Secondary | ICD-10-CM | POA: Insufficient documentation

## 2018-08-22 DIAGNOSIS — I639 Cerebral infarction, unspecified: Secondary | ICD-10-CM | POA: Insufficient documentation

## 2018-08-22 DIAGNOSIS — R1310 Dysphagia, unspecified: Secondary | ICD-10-CM | POA: Diagnosis not present

## 2018-08-22 DIAGNOSIS — I119 Hypertensive heart disease without heart failure: Secondary | ICD-10-CM | POA: Insufficient documentation

## 2018-08-22 DIAGNOSIS — I2510 Atherosclerotic heart disease of native coronary artery without angina pectoris: Secondary | ICD-10-CM | POA: Insufficient documentation

## 2018-08-22 DIAGNOSIS — F419 Anxiety disorder, unspecified: Secondary | ICD-10-CM | POA: Diagnosis not present

## 2018-08-22 DIAGNOSIS — R200 Anesthesia of skin: Secondary | ICD-10-CM | POA: Diagnosis not present

## 2018-08-22 DIAGNOSIS — Z885 Allergy status to narcotic agent status: Secondary | ICD-10-CM | POA: Insufficient documentation

## 2018-08-22 DIAGNOSIS — K219 Gastro-esophageal reflux disease without esophagitis: Secondary | ICD-10-CM | POA: Insufficient documentation

## 2018-08-22 DIAGNOSIS — I712 Thoracic aortic aneurysm, without rupture: Secondary | ICD-10-CM | POA: Insufficient documentation

## 2018-08-22 DIAGNOSIS — N6310 Unspecified lump in the right breast, unspecified quadrant: Secondary | ICD-10-CM | POA: Insufficient documentation

## 2018-08-22 LAB — CBC
HEMATOCRIT: 34 % — AB (ref 35.0–47.0)
Hemoglobin: 11.1 g/dL — ABNORMAL LOW (ref 12.0–16.0)
MCH: 25.7 pg — AB (ref 26.0–34.0)
MCHC: 32.7 g/dL (ref 32.0–36.0)
MCV: 78.5 fL — AB (ref 80.0–100.0)
Platelets: 310 10*3/uL (ref 150–440)
RBC: 4.33 MIL/uL (ref 3.80–5.20)
RDW: 15.3 % — ABNORMAL HIGH (ref 11.5–14.5)
WBC: 9.7 10*3/uL (ref 3.6–11.0)

## 2018-08-22 LAB — BASIC METABOLIC PANEL
Anion gap: 11 (ref 5–15)
BUN: 16 mg/dL (ref 8–23)
CO2: 28 mmol/L (ref 22–32)
Calcium: 8.7 mg/dL — ABNORMAL LOW (ref 8.9–10.3)
Chloride: 101 mmol/L (ref 98–111)
Creatinine, Ser: 1.53 mg/dL — ABNORMAL HIGH (ref 0.44–1.00)
GFR calc Af Amer: 40 mL/min — ABNORMAL LOW (ref >60)
GFR calc non Af Amer: 35 mL/min — ABNORMAL LOW (ref >60)
GLUCOSE: 148 mg/dL — AB (ref 70–99)
Potassium: 2.9 mmol/L — ABNORMAL LOW (ref 3.5–5.1)
SODIUM: 140 mmol/L (ref 135–145)

## 2018-08-22 LAB — PROTIME-INR
INR: 1.02
Prothrombin Time: 13.3 seconds (ref 11.4–15.2)

## 2018-08-22 LAB — APTT: aPTT: 27 seconds (ref 24–36)

## 2018-08-22 LAB — MAGNESIUM: Magnesium: 1.8 mg/dL (ref 1.7–2.4)

## 2018-08-22 LAB — TROPONIN I: Troponin I: <0.03 ng/mL (ref <0.03)

## 2018-08-22 MED ORDER — ATORVASTATIN CALCIUM 20 MG PO TABS
40.0000 mg | ORAL_TABLET | Freq: Every day | ORAL | Status: DC
  Administered 2018-08-22 – 2018-08-23 (×2): 40 mg via ORAL
  Filled 2018-08-22 (×2): qty 2

## 2018-08-22 MED ORDER — IOHEXOL 350 MG/ML SOLN
60.0000 mL | Freq: Once | INTRAVENOUS | Status: AC | PRN
  Administered 2018-08-22: 60 mL via INTRAVENOUS

## 2018-08-22 MED ORDER — ALBUTEROL SULFATE (2.5 MG/3ML) 0.083% IN NEBU: 2.5000 mg | INHALATION_SOLUTION | RESPIRATORY_TRACT | Status: DC | PRN

## 2018-08-22 MED ORDER — GABAPENTIN 300 MG PO CAPS
300.0000 mg | ORAL_CAPSULE | Freq: Every day | ORAL | Status: DC
  Administered 2018-08-22 – 2018-08-23 (×2): 300 mg via ORAL
  Filled 2018-08-22 (×2): qty 1

## 2018-08-22 MED ORDER — CARVEDILOL 3.125 MG PO TABS
3.1250 mg | ORAL_TABLET | Freq: Two times a day (BID) | ORAL | Status: DC
  Administered 2018-08-22 – 2018-08-24 (×4): 3.125 mg via ORAL
  Filled 2018-08-22 (×4): qty 1

## 2018-08-22 MED ORDER — POTASSIUM CHLORIDE CRYS ER 20 MEQ PO TBCR
40.0000 meq | EXTENDED_RELEASE_TABLET | Freq: Once | ORAL | Status: AC
  Administered 2018-08-22: 40 meq via ORAL
  Filled 2018-08-22: qty 2

## 2018-08-22 MED ORDER — ASPIRIN EC 325 MG PO TBEC
325.0000 mg | DELAYED_RELEASE_TABLET | Freq: Every day | ORAL | Status: DC
  Administered 2018-08-22 – 2018-08-24 (×3): 325 mg via ORAL
  Filled 2018-08-22 (×3): qty 1

## 2018-08-22 MED ORDER — ASPIRIN EC 325 MG PO TBEC: 325.0000 mg | DELAYED_RELEASE_TABLET | Freq: Every day | ORAL | Status: DC

## 2018-08-22 MED ORDER — STROKE: EARLY STAGES OF RECOVERY BOOK
Freq: Once | Status: AC
  Administered 2018-08-22: 17:00:00

## 2018-08-22 MED ORDER — ACETAMINOPHEN 650 MG RE SUPP: 650.0000 mg | RECTAL | Status: DC | PRN

## 2018-08-22 MED ORDER — AMLODIPINE BESYLATE 5 MG PO TABS
5.0000 mg | ORAL_TABLET | Freq: Every day | ORAL | Status: DC
  Administered 2018-08-23 – 2018-08-24 (×2): 5 mg via ORAL
  Filled 2018-08-22 (×2): qty 1

## 2018-08-22 MED ORDER — ASPIRIN 300 MG RE SUPP: 300.0000 mg | Freq: Every day | RECTAL | Status: DC

## 2018-08-22 MED ORDER — FUROSEMIDE 40 MG PO TABS
40.0000 mg | ORAL_TABLET | Freq: Every day | ORAL | Status: DC
  Administered 2018-08-23 – 2018-08-24 (×2): 40 mg via ORAL
  Filled 2018-08-22 (×2): qty 1

## 2018-08-22 MED ORDER — INFLUENZA VAC SPLIT HIGH-DOSE 0.5 ML IM SUSY
0.5000 mL | PREFILLED_SYRINGE | INTRAMUSCULAR | Status: AC
  Administered 2018-08-23: 0.5 mL via INTRAMUSCULAR
  Filled 2018-08-22 (×3): qty 0.5

## 2018-08-22 MED ORDER — ENOXAPARIN SODIUM 40 MG/0.4ML ~~LOC~~ SOLN
40.0000 mg | SUBCUTANEOUS | Status: DC
  Administered 2018-08-22 – 2018-08-23 (×2): 40 mg via SUBCUTANEOUS
  Filled 2018-08-22 (×2): qty 0.4

## 2018-08-22 MED ORDER — SPIRONOLACTONE 25 MG PO TABS
25.0000 mg | ORAL_TABLET | Freq: Every day | ORAL | Status: DC
  Administered 2018-08-23 – 2018-08-24 (×2): 25 mg via ORAL
  Filled 2018-08-22 (×2): qty 1

## 2018-08-22 MED ORDER — ACETAMINOPHEN 160 MG/5ML PO SOLN
650.0000 mg | ORAL | Status: DC | PRN
  Filled 2018-08-22: qty 20.3

## 2018-08-22 MED ORDER — MIRTAZAPINE 15 MG PO TABS
45.0000 mg | ORAL_TABLET | Freq: Every day | ORAL | Status: DC
  Administered 2018-08-22 – 2018-08-23 (×2): 45 mg via ORAL
  Filled 2018-08-22 (×2): qty 3

## 2018-08-22 MED ORDER — ACETAMINOPHEN 325 MG PO TABS
650.0000 mg | ORAL_TABLET | ORAL | Status: DC | PRN
  Administered 2018-08-22 – 2018-08-23 (×7): 650 mg via ORAL
  Filled 2018-08-22 (×8): qty 2

## 2018-08-22 NOTE — ED Notes (Signed)
Pt taken to CT.

## 2018-08-22 NOTE — ED Triage Notes (Signed)
Pt reports that she developed left sided chest pain last night with N/V, and SOB that radiates to her back.

## 2018-08-22 NOTE — Progress Notes (Signed)
Pharmacy Electrolyte Monitoring Consult:  Pharmacy consulted to assist in monitoring and replacing electrolytes in this 67 y.o. female admitted on 08/22/2018 with Chest Pain   Labs:  Sodium (mmol/L)  Date Value  08/22/2018 140  09/18/2012 139   Potassium (mmol/L)  Date Value  08/22/2018 2.9 (L)  09/18/2012 3.3 (L)   Magnesium (mg/dL)  Date Value  08/22/2018 1.8   Phosphorus (mg/dL)  Date Value  09/06/2017 3.8   Calcium (mg/dL)  Date Value  08/22/2018 8.7 (L)   Calcium, Total (mg/dL)  Date Value  09/18/2012 9.4   Albumin (g/dL)  Date Value  10/04/2017 3.1 (L)  09/18/2012 2.8 (L)   Patient on lasix and spironolactone PTA  Assessment/Plan: K 2.9  Mag 1.8  Scr 1.53 KCL 40 meq PO x 1 ordered (per MD patient's swallowing is ok and PO is ok). Patient has Lasix 40 mg po daily and spironolactone 25 mg daily ordered. Will f/u labs in am.  Taiyo Kozma A 08/22/2018 6:40 PM

## 2018-08-22 NOTE — Plan of Care (Signed)
  Problem: Education: Goal: Knowledge of General Education information will improve Description Including pain rating scale, medication(s)/side effects and non-pharmacologic comfort measures Outcome: Progressing   Problem: Health Behavior/Discharge Planning: Goal: Ability to manage health-related needs will improve Outcome: Progressing   Problem: Clinical Measurements: Goal: Ability to maintain clinical measurements within normal limits will improve Outcome: Progressing Goal: Will remain free from infection Outcome: Progressing Goal: Diagnostic test results will improve Outcome: Progressing Goal: Respiratory complications will improve Outcome: Progressing Goal: Cardiovascular complication will be avoided Outcome: Progressing   Problem: Activity: Goal: Risk for activity intolerance will decrease Outcome: Progressing   Problem: Nutrition: Goal: Adequate nutrition will be maintained Outcome: Progressing   Problem: Coping: Goal: Level of anxiety will decrease Outcome: Progressing   Problem: Elimination: Goal: Will not experience complications related to bowel motility Outcome: Progressing Goal: Will not experience complications related to urinary retention Outcome: Progressing   Problem: Pain Managment: Goal: General experience of comfort will improve Outcome: Progressing   Problem: Safety: Goal: Ability to remain free from injury will improve Outcome: Progressing   Problem: Skin Integrity: Goal: Risk for impaired skin integrity will decrease Outcome: Progressing   Problem: Education: Goal: Knowledge of disease or condition will improve Outcome: Progressing Goal: Knowledge of secondary prevention will improve Outcome: Progressing Goal: Knowledge of patient specific risk factors addressed and post discharge goals established will improve Outcome: Progressing   Problem: Self-Care: Goal: Ability to participate in self-care as condition permits will  improve Outcome: Progressing Goal: Verbalization of feelings and concerns over difficulty with self-care will improve Outcome: Progressing Goal: Ability to communicate needs accurately will improve Outcome: Progressing   Problem: Nutrition: Goal: Risk of aspiration will decrease Outcome: Progressing   Problem: Ischemic Stroke/TIA Tissue Perfusion: Goal: Complications of ischemic stroke/TIA will be minimized Outcome: Progressing

## 2018-08-22 NOTE — ED Notes (Signed)
Pt transported to floor by Wilfred Lacy, EDT.

## 2018-08-22 NOTE — ED Notes (Signed)
Pt presents with sudden onset L sided numbness that started last night. Pt also c/o L chest pain that started. Pt states hx of stroke and TIA at this time. Pt also c/o intermittent HA at this time.

## 2018-08-22 NOTE — H&P (Addendum)
[facility] NAME: Morgan [name]    MR#:  [id number]  DATE OF BIRTH:  [birth year]  DATE OF ADMISSION:  08/22/2018  PRIMARY CARE PHYSICIAN: [name], [name], MD   REQUESTING/REFERRING PHYSICIAN:   CHIEF COMPLAINT:   Numbness left upper and lower extremity since yesterday HISTORY OF PRESENT ILLNESS:  Morgan [name]  is a 67 y.o. female with a known history of previous CVA recovered completely, anxiety, bipolar, hypertension comes to the emergency room after she started noticing numbness and heavy feeling in her left upper and lower extremity. Patient came to the emergency room CT head was negative for stroke. Given her symptoms which have been persistent and previous history of stroke she is being admitted for suspected CVA. MRI of the brain is pending.  pt is hemodynamically stable. She denies any dysphagia or slurred speech or blurred vision.  No family in the ER  PAST MEDICAL HISTORY:   Past Medical History:  Diagnosis Date  . Allergy   . Anemia   . Anxiety   . Arthritis   . Bipolar 1 disorder ([place])   . Depression   . GERD (gastroesophageal reflux disease)   . Hyperlipidemia   . Hypertension   . MVP (mitral valve prolapse)     PAST SURGICAL HISTOIRY:   Past Surgical History:  Procedure Laterality Date  . ABDOMINAL HYSTERECTOMY    . APPENDECTOMY    . BREAST SURGERY Right 1988   tumor removal  . KNEE SURGERY  2005   2 times left  . TUBAL LIGATION      SOCIAL HISTORY:   Social History   Tobacco Use  . Smoking status: Former Smoker    Types: Cigarettes  . Smokeless tobacco: Never Used  Substance Use Topics  . Alcohol use: Yes    FAMILY HISTORY:   Family History  Problem Relation Age of Onset  . Breast cancer Mother   . Ovarian cancer Mother   . Hypertension Father   . Hyperlipidemia Father   . Heart disease Father   . Breast cancer Maternal Grandmother   . Cancer Paternal Grandmother   .  Diabetes Paternal Grandmother   . Cancer Paternal Grandfather   . Breast cancer Maternal Aunt   . Breast cancer Maternal Aunt     DRUG ALLERGIES:   Allergies  Allergen Reactions  . Demerol [Meperidine] Hives  . Strawberry (Diagnostic) Hives    REVIEW OF SYSTEMS:  Review of Systems  Constitutional: Negative for chills, fever and weight loss.  HENT: Negative for ear discharge, ear pain and nosebleeds.   Eyes: Negative for blurred vision, pain and discharge.  Respiratory: Negative for sputum production, shortness of breath, wheezing and stridor.   Cardiovascular: Negative for chest pain, palpitations, orthopnea and PND.  Gastrointestinal: Negative for abdominal pain, diarrhea, nausea and vomiting.  Genitourinary: Negative for frequency and urgency.  Musculoskeletal: Negative for back pain and joint pain.  Neurological: Positive for tingling and focal weakness. Negative for sensory change, speech change and weakness.  Psychiatric/Behavioral: Negative for depression and hallucinations. The patient is not nervous/anxious.      MEDICATIONS AT HOME:   Prior to Admission medications   Medication Sig Start Date End Date Taking? Authorizing Provider  acetaminophen (TYLENOL) 500 MG tablet Take 1,000 mg by mouth every 6 (six) hours as needed for moderate pain (pain).    Yes [provider]  albuterol (PROVENTIL HFA;VENTOLIN HFA) 108 (90 BASE) MCG/ACT inhaler Inhale 2  puffs into the lungs every 4 (four) hours as needed for wheezing or shortness of breath. 02/24/15  Yes Ghimire, Henreitta Leber, MD  amLODipine (NORVASC) 5 MG tablet Take 5 mg by mouth daily.    Yes [provider]  aspirin EC 325 MG EC tablet Take 1 tablet (325 mg total) by mouth daily. 10/07/17  Yes Dustin Flock, MD  atorvastatin (LIPITOR) 40 MG tablet Take 40 mg by mouth at bedtime.   Yes [provider]  carvedilol (COREG) 3.125 MG tablet Take 3.125 mg by mouth 2 (two) times daily with a meal.   Yes  [provider]  furosemide (LASIX) 40 MG tablet Take 40 mg by mouth.   Yes [provider]  gabapentin (NEURONTIN) 300 MG capsule Take 1 capsule (300 mg total) by mouth 3 (three) times daily as needed. Patient taking differently: Take 300 mg by mouth at bedtime.  09/12/17  Yes Sudini, Alveta Heimlich, MD  mirtazapine (REMERON) 45 MG tablet Take 45 mg by mouth at bedtime.    Yes [provider]  spironolactone (ALDACTONE) 25 MG tablet Take 25 mg by mouth daily.   Yes [provider]      VITAL SIGNS:  Blood pressure (!) 154/89, pulse 71, temperature 98.6 F (37 C), temperature source Oral, resp. rate 13, height 6\' 1"  (1.854 m), weight 104.3 kg, SpO2 99 %.  PHYSICAL EXAMINATION:  GENERAL:  67 y.o.-year-old patient lying in the bed with no acute distress.  EYES: Pupils equal, round, reactive to light and accommodation. No scleral icterus. Extraocular muscles intact.  HEENT: Head atraumatic, normocephalic. Oropharynx and nasopharynx clear.  NECK:  Supple, no jugular venous distention. No thyroid enlargement, no tenderness.  LUNGS: Normal breath sounds bilaterally, no wheezing, rales,rhonchi or crepitation. No use of accessory muscles of respiration.  CARDIOVASCULAR: S1, S2 normal. No murmurs, rubs, or gallops.  ABDOMEN: Soft, nontender, nondistended. Bowel sounds present. No organomegaly or mass.  EXTREMITIES: No pedal edema, cyanosis, or clubbing.  NEUROLOGIC: Cranial nerves II through XII are intact. Muscle strength 4/5 in all extremities. It is decreased sensation and left and upper and lower extremity. gait not tested. PSYCHIATRIC: The patient is alert and oriented x 3.  SKIN: No obvious rash, lesion, or ulcer.   LABORATORY PANEL:   CBC Recent Labs  Lab 08/22/18 1307  WBC 9.7  HGB 11.1*  HCT 34.0*  PLT 310   ------------------------------------------------------------------------------------------------------------------  Chemistries  Recent Labs   Lab 08/22/18 1307  NA 140  K 2.9*  CL 101  CO2 28  GLUCOSE 148*  BUN 16  CREATININE 1.53*  CALCIUM 8.7*   ------------------------------------------------------------------------------------------------------------------  Cardiac Enzymes Recent Labs  Lab 08/22/18 1307  TROPONINI <0.03   ------------------------------------------------------------------------------------------------------------------  RADIOLOGY:  Dg Chest 2 View  Result Date: 08/22/2018 CLINICAL DATA:  LEFT side chest pain last night with nausea and vomiting. Shortness of breath. EXAM: CHEST - 2 VIEW COMPARISON:  Chest x-rays dated 05/05/2018 and 11/28/2017. Chest CT dated 05/05/2018. FINDINGS: Heart size and mediastinal contours are stable. Prominent epicardial fat pad, as demonstrated on recent chest CT. Chronic bronchitic changes. Additional coarse lung markings bilaterally, upper lobe predominant, suggesting some degree of chronic interstitial lung disease/fibrosis. No new lung abnormality. No confluent opacity to suggest a developing pneumonia. No pleural effusion or pneumothorax seen. No acute or suspicious osseous finding. IMPRESSION: 1. No active cardiopulmonary disease. No evidence of pneumonia or pulmonary edema. 2. 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 Electronically Signed   By: Cherlynn Kaiser  Enriqueta Shutter M.D.   On: 08/22/2018 13:38   Ct Head Wo Contrast  Result Date: 08/22/2018 CLINICAL DATA:  Sudden onset left-sided numbness last night. EXAM: CT HEAD WITHOUT CONTRAST TECHNIQUE: Contiguous axial images were obtained from the base of the skull through the vertex without intravenous contrast. COMPARISON:  Multiple exams, including 10/04/2017, and MRI from 10/05/2017 FINDINGS: Brain: The brainstem, cerebellum, cerebral peduncles, thalami, basal ganglia, basilar cisterns, and ventricular system appear within normal limits. No intracranial hemorrhage, mass lesion, or acute CVA.  Vascular: Unremarkable Skull: Unremarkable Sinuses/Orbits: Unremarkable Other: No supplemental non-categorized findings. IMPRESSION: 1.  No significant abnormality identified. Electronically Signed   By: Van Clines M.D.   On: 08/22/2018 15:20   Ct Angio Chest Pe W And/or Wo Contrast  Result Date: 08/22/2018 CLINICAL DATA:  Sudden onset left-sided numbness and left chest pain. EXAM: CT ANGIOGRAPHY CHEST WITH CONTRAST TECHNIQUE: Multidetector CT imaging of the chest was performed using the standard protocol during bolus administration of intravenous contrast. Multiplanar CT image reconstructions and MIPs were obtained to evaluate the vascular anatomy. CONTRAST:  42mL OMNIPAQUE IOHEXOL 350 MG/ML SOLN COMPARISON:  CT chest 05/05/2017 and chest radiograph from 08/22/2018 FINDINGS: Cardiovascular: No filling defect is identified in the pulmonary arterial tree to suggest pulmonary embolus. Coronary, aortic arch, and branch vessel atherosclerotic vascular disease. 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 Borderline cardiomegaly. Mediastinum/Nodes: Slightly asymmetric glandular tissues in the breasts, greater in the right upper breast, but without a discrete focal lesion observed. Small supraclavicular and mediastinal lymph nodes are not pathologically enlarged. Lungs/Pleura: Biapical pleuroparenchymal scarring. Mild ground-glass opacity and bandlike density anteriorly in the right upper lobe for example on image 35/6, new compared to the prior exam of 05/05/2018. Mild airway thickening. Mild scarring anteromedially in the left upper lobe, unchanged, with associated volume loss. There is also some scarring inferiorly in the lingula, likewise stable. A dense calcified granuloma in the left lower lobe adjacent to the diaphragm is stable on image 67/6. No pleural effusion. Upper Abdomen: Unremarkable Musculoskeletal: Old healed left rib  fractures. Old healed upper sternal body fracture. Review of the MIP images confirms the above findings. IMPRESSION: 1. No filling defect is identified in the pulmonary arterial tree to suggest pulmonary embolus. 2. Stable appearance of 4.3 cm ascending aortic aneurysm. Recommend annual imaging followup by CTA or MRA. This recommendation follows 2010 ACCF/AHA/AATS/ACR/ASA/SCA/SCAI/SIR/STS/SVM Guidelines for the Diagnosis and Management of Patients with Thoracic Aortic Disease. Circulation. 2010; 121: T614-E315 3. Mild airway thickening with some ground-glass and bandlike opacities anteriorly in the right upper lobe which are new from 05/05/2018, and may represent a small region of atypical infection. 4. Aortic Atherosclerosis (ICD10-I70.0). Coronary atherosclerosis with borderline cardiomegaly. Electronically Signed   By: Van Clines M.D.   On: 08/22/2018 15:18    EKG:    IMPRESSION AND PLAN:   Morgan [name]  is a 67 y.o. female with a known history of previous CVA recovered completely, anxiety, bipolar, hypertension comes to the emergency room after she started noticing numbness and heavy feeling in her left upper and lower extremity. Patient came to the emergency room CT head was negative for stroke. Given her symptoms which have been persistent and previous history of stroke she is being admitted for suspected CVA. MRI of the brain is pending.  1. left upper and lower extremity numbness suspected CVA -admit to medical floor -aspirin 325 mg PO daily -MRI of the brain pending -ultrasound carotid Doppler, echo of  the heart -neurology consultation. Dr. Doy Mince aware-- informed by ER physician -continue statins -PT OT speech therapy  2. hypertension continue home  3. Hyperlipidemia continue statins  4. DVT prophylaxis subcu Lovenox  5. Stable ascending thoracic aneurysm at 4.3 according to CT angiogram  6. Hypokalemia Pharmacy consult placed  No family in the ER  All the records  are reviewed and case discussed with ED provider. Management plans discussed with the patient, family and they are in agreement.  CODE STATUS: full  TOTAL TIME TAKING CARE OF THIS PATIENT: *50* minutes.    Fritzi Mandes M.D on 08/22/2018 at 4:06 PM  Between 7am to 6pm - Pager - [phone]  After 6pm go to [email or web address] - password [email or web address]  [facility]  Office  [phone]  CC: Primary care physician; Javier Docker, MD

## 2018-08-22 NOTE — ED Provider Notes (Signed)
[facility] Emergency Department Provider Note   ____________________________________________    I have reviewed the triage vital signs and the nursing notes.   HISTORY  Chief Complaint Chest Pain     HPI Morgan [name] is a 67 y.o. female who presents with complaints of left arm and left leg "heaviness ".  She also reports chest pressure which developed around the same time as her left sided complaints.  Patient reports last night she developed tingling in her left arm and left leg, this morning when she woke up she felt heaviness in her left arm and left leg.  She reports a history of a CVA in the past with no deficits.  Also reports intermittent headache over the last several days.  Occasional shortness of breath, chronic cough.   Past Medical History:  Diagnosis Date  . Allergy   . Anemia   . Anxiety   . Arthritis   . Bipolar 1 disorder ([place])   . Depression   . GERD (gastroesophageal reflux disease)   . Hyperlipidemia   . Hypertension   . MVP (mitral valve prolapse)     Patient Active Problem List   Diagnosis Date Noted  . Right sided weakness 10/04/2017  . HLD (hyperlipidemia) 10/04/2017  . GERD (gastroesophageal reflux disease) 10/04/2017  . Acute respiratory failure ([place])   . Anxiety 08/26/2017  . Sepsis ([place]) 08/26/2017  . HCAP (healthcare-associated pneumonia) 08/26/2017  . Essential hypertension   . Depression   . CAP (community acquired pneumonia) 02/22/2015  . Cough 02/22/2015  . Diarrhea 02/22/2015  . Hypokalemia 02/22/2015  . Tobacco abuse 02/22/2015  . Mass of breast, right 11/18/2013  . Chronic lower back pain 04/29/2013  . Paresthesia 04/29/2013    Past Surgical History:  Procedure Laterality Date  . ABDOMINAL HYSTERECTOMY    . APPENDECTOMY    . BREAST SURGERY Right 1988   tumor removal  . KNEE SURGERY  2005   2 times left  . TUBAL LIGATION      Prior to Admission medications   Medication Sig Start Date  End Date Taking? Authorizing Provider  acetaminophen (TYLENOL) 500 MG tablet Take 1,000 mg by mouth every 6 (six) hours as needed for moderate pain (pain).     [provider]  albuterol (PROVENTIL HFA;VENTOLIN HFA) 108 (90 BASE) MCG/ACT inhaler Inhale 2 puffs into the lungs every 4 (four) hours as needed for wheezing or shortness of breath. 02/24/15   Ghimire, Henreitta Leber, MD  amLODipine (NORVASC) 2.5 MG tablet Take 2.5 mg by mouth daily.    [provider]  ARIPiprazole (ABILIFY) 15 MG tablet Take 15 mg by mouth daily.     [provider]  aspirin EC 325 MG EC tablet Take 1 tablet (325 mg total) by mouth daily. 10/07/17   Dustin Flock, MD  benztropine (COGENTIN) 0.5 MG tablet Take 0.5 mg by mouth 2 (two) times daily.    [provider]  colestipol (COLESTID) 1 g tablet TAKE 1 TABLET (1 G TOTAL) BY MOUTH 2 (TWO) TIMES DAILY. 07/30/18   Mauri Pole, MD  esomeprazole (NEXIUM) 40 MG capsule TAKE 1 CAPSULE (40 MG TOTAL) BY MOUTH DAILY AT 12 NOON. 07/30/18   Mauri Pole, MD  FLUoxetine (PROZAC) 20 MG capsule Take 20 mg by mouth daily.    [provider]  gabapentin (NEURONTIN) 300 MG capsule Take 1 capsule (300 mg total) by mouth 3 (three) times daily as needed. Patient taking differently:  Take 300 mg by mouth 3 (three) times daily as needed (pain).  09/12/17   Hillary Bow, MD  hydrOXYzine (VISTARIL) 25 MG capsule Take 1 capsule (25 mg total) by mouth 3 (three) times daily as needed for anxiety. Take 25 mg by mouth twice daily and take 50 mg by mouth at bedtime as needed 09/12/17   Hillary Bow, MD  mirtazapine (REMERON) 45 MG tablet Take 45 mg by mouth at bedtime.     [provider]  nabumetone (RELAFEN) 500 MG tablet Take 500 mg by mouth 2 (two) times daily as needed for pain. 10/01/17   [provider]  pantoprazole (PROTONIX) 40 MG tablet Take 40 mg by mouth daily.    [provider]  potassium chloride (K-DUR)  10 MEQ tablet Take 10 mEq by mouth daily.    [provider]     Allergies Demerol [meperidine] and Strawberry (diagnostic)  Family History  Problem Relation Age of Onset  . Breast cancer Mother   . Ovarian cancer Mother   . Hypertension Father   . Hyperlipidemia Father   . Heart disease Father   . Breast cancer Maternal Grandmother   . Cancer Paternal Grandmother   . Diabetes Paternal Grandmother   . Cancer Paternal Grandfather   . Breast cancer Maternal Aunt   . Breast cancer Maternal Aunt     Social History Social History   Tobacco Use  . Smoking status: Former Smoker    Types: Cigarettes  . Smokeless tobacco: Never Used  Substance Use Topics  . Alcohol use: Yes  . Drug use: No    Review of Systems  Constitutional: No fever/chills Eyes: No visual changes.  ENT: No sore throat. Cardiovascular: As above Respiratory: As above Gastrointestinal: No abdominal pain.  No nausea, no vomiting.   Genitourinary: Negative for dysuria. Musculoskeletal: Negative for back pain. Skin: Negative for rash. Neurological: As above   ____________________________________________   PHYSICAL EXAM:  VITAL SIGNS: ED Triage Vitals  Enc Vitals Group     BP 08/22/18 1311 127/72     Pulse Rate 08/22/18 1311 82     Resp 08/22/18 1311 20     Temp 08/22/18 1311 98.6 F (37 C)     Temp Source 08/22/18 1311 Oral     SpO2 08/22/18 1311 95 %     Weight 08/22/18 1313 104.3 kg (230 lb)     Height 08/22/18 1313 1.854 m (6\' 1" )     Head Circumference --      Peak Flow --      Pain Score 08/22/18 1313 6     Pain Loc --      Pain Edu? --      Excl. in [place]? --     Constitutional: Alert and oriented. No acute distress. Pleasant and interactive  Nose: No congestion/rhinnorhea. Mouth/Throat: Mucous membranes are moist.    Cardiovascular: Normal rate, regular rhythm. Grossly normal heart sounds.  Good peripheral circulation. Respiratory: Normal respiratory effort.  No  retractions.  Scattered mild wheezes Gastrointestinal: Soft and nontender. No distention.  N Genitourinary: deferred Musculoskeletal: No lower extremity tenderness nor edema.  Warm and well perfused Neurologic:  Normal speech and language.  Possible left-sided facial droop, mild, patient is able to hold left arm and left leg up against gravity but only briefly Skin:  Skin is warm, dry and intact. No rash noted. Psychiatric: Mood and affect are normal. Speech and behavior are normal.  ____________________________________________   LABS (all labs ordered  are listed, but only abnormal results are displayed)  Labs Reviewed  BASIC METABOLIC PANEL - Abnormal; Notable for the following components:      Result Value   Potassium 2.9 (*)    Glucose, Bld 148 (*)    Creatinine, Ser 1.53 (*)    Calcium 8.7 (*)    GFR calc non Af Amer 35 (*)    GFR calc Af Amer 40 (*)    All other components within normal limits  CBC - Abnormal; Notable for the following components:   Hemoglobin 11.1 (*)    HCT 34.0 (*)    MCV 78.5 (*)    MCH 25.7 (*)    RDW 15.3 (*)    All other components within normal limits  TROPONIN I  PROTIME-INR  APTT   ____________________________________________  EKG  ED ECG REPORT I, Lavonia Drafts, the attending physician, personally viewed and interpreted this ECG.  Date: 08/22/2018  Rhythm: normal sinus rhythm QRS Axis: normal Intervals: normal ST/T Wave abnormalities: Nonspecific ST changes   ____________________________________________  RADIOLOGY  CT angio, stable aneurysm, no PE CT head, no acute stroke Chest x-ray no acute distress ____________________________________________   PROCEDURES  Procedure(s) performed: No  Procedures   Critical Care performed: No ____________________________________________   INITIAL IMPRESSION / ASSESSMENT AND PLAN / ED COURSE  Pertinent labs & imaging results that were available during my care of the patient were  reviewed by me and considered in my medical decision making (see chart for details).  Patient presents with complaints of left-sided "heaviness "which is concerning for CVA, out of the window given that she woke up with symptoms.  She also complains of chest pain.  Has a history of an ascending aneurysm which is concerning giving her constellation of symptoms.  Troponin is normal.  Gust with Dr. Doy Mince of neurology who recommends CT head without contrast and CTA of the chest at this time  CT angiography of the chest demonstrates a stable aneurysm, no PE.  CT head is unremarkable.  Will discuss with hospitalist for admission for CVA    ____________________________________________   FINAL CLINICAL IMPRESSION(S) / ED DIAGNOSES  Final diagnoses:  Chest pain, unspecified type  Cerebrovascular accident (CVA), unspecified mechanism ([place])        Note:  This document was prepared using Dragon voice recognition software and may include unintentional dictation errors.    Lavonia Drafts, MD 08/22/18 [phone]

## 2018-08-23 ENCOUNTER — Observation Stay
Admit: 2018-08-23 | Discharge: 2018-08-23 | Disposition: A | Discharge status: Home / Self Care | Payer: Medicare HMO | Attending: Internal Medicine | Admitting: Internal Medicine

## 2018-08-23 ENCOUNTER — Observation Stay: Payer: Medicare HMO

## 2018-08-23 DIAGNOSIS — G43101 Migraine with aura, not intractable, with status migrainosus: Secondary | ICD-10-CM | POA: Diagnosis not present

## 2018-08-23 LAB — BASIC METABOLIC PANEL
Anion gap: 5 (ref 5–15)
BUN: 17 mg/dL (ref 8–23)
CHLORIDE: 103 mmol/L (ref 98–111)
CO2: 32 mmol/L (ref 22–32)
CREATININE: 1.08 mg/dL — AB (ref 0.44–1.00)
Calcium: 8.5 mg/dL — ABNORMAL LOW (ref 8.9–10.3)
GFR calc Af Amer: >60 mL/min (ref >60)
GFR calc non Af Amer: 53 mL/min — ABNORMAL LOW (ref >60)
GLUCOSE: 104 mg/dL — AB (ref 70–99)
Potassium: 2.8 mmol/L — ABNORMAL LOW (ref 3.5–5.1)
Sodium: 140 mmol/L (ref 135–145)

## 2018-08-23 LAB — ECHOCARDIOGRAM COMPLETE
Height: 73 in
Weight: 3680 oz

## 2018-08-23 LAB — POTASSIUM: POTASSIUM: 3.6 mmol/L (ref 3.5–5.1)

## 2018-08-23 MED ORDER — SUMATRIPTAN SUCCINATE 6 MG/0.5ML ~~LOC~~ SOLN
6.0000 mg | Freq: Once | SUBCUTANEOUS | Status: AC
  Administered 2018-08-23: 13:00:00 6 mg via SUBCUTANEOUS
  Filled 2018-08-23: qty 0.5

## 2018-08-23 MED ORDER — VALPROATE SODIUM 100 MG/ML IV SOLN: 500.0000 mg | Freq: Once | INTRAVENOUS | Status: DC

## 2018-08-23 MED ORDER — KETOROLAC TROMETHAMINE 30 MG/ML IJ SOLN
30.0000 mg | Freq: Three times a day (TID) | INTRAMUSCULAR | Status: DC | PRN
  Administered 2018-08-24: 30 mg via INTRAVENOUS
  Filled 2018-08-23: qty 1

## 2018-08-23 MED ORDER — VALPROATE SODIUM 500 MG/5ML IV SOLN
500.0000 mg | Freq: Once | INTRAVENOUS | Status: AC
  Administered 2018-08-23: 500 mg via INTRAVENOUS
  Filled 2018-08-23: qty 5

## 2018-08-23 MED ORDER — POTASSIUM CHLORIDE CRYS ER 20 MEQ PO TBCR
40.0000 meq | EXTENDED_RELEASE_TABLET | ORAL | Status: AC
  Administered 2018-08-23 (×2): 40 meq via ORAL
  Filled 2018-08-23: qty 2

## 2018-08-23 MED ORDER — PROCHLORPERAZINE EDISYLATE 10 MG/2ML IJ SOLN
10.0000 mg | Freq: Once | INTRAMUSCULAR | Status: AC
  Administered 2018-08-23: 16:00:00 10 mg via INTRAVENOUS
  Filled 2018-08-23: qty 2

## 2018-08-23 MED ORDER — METHOCARBAMOL 1000 MG/10ML IJ SOLN
500.0000 mg | Freq: Once | INTRAVENOUS | Status: AC
  Administered 2018-08-23: 500 mg via INTRAVENOUS
  Filled 2018-08-23: qty 5

## 2018-08-23 NOTE — Care Management Obs Status (Signed)
[facility] NOTIFICATION   Patient Details  Name: Morgan [name] MRN: [id number] Date of Birth: [birth year]   Medicare Observation Status Notification Given:  Yes    Kanden Carey A Tirso Laws, RN 08/23/2018, 9:26 AM

## 2018-08-23 NOTE — Progress Notes (Signed)
Pharmacy Electrolyte Monitoring Consult:  Pharmacy consulted to assist in monitoring and replacing electrolytes in this 67 y.o. female admitted on 08/22/2018 with Chest Pain   Labs:  Sodium (mmol/L)  Date Value  08/23/2018 140  09/18/2012 139   Potassium (mmol/L)  Date Value  08/23/2018 2.8 (L)  09/18/2012 3.3 (L)   Magnesium (mg/dL)  Date Value  08/22/2018 1.8   Phosphorus (mg/dL)  Date Value  09/06/2017 3.8   Calcium (mg/dL)  Date Value  08/23/2018 8.5 (L)   Calcium, Total (mg/dL)  Date Value  09/18/2012 9.4   Albumin (g/dL)  Date Value  10/04/2017 3.1 (L)  09/18/2012 2.8 (L)   Patient on lasix and spironolactone PTA  Assessment/Plan: K 2.8  Scr 1.08 Will order KCL 40 meq PO q2h x 2 ordered (per MD patient's swallowing is ok and PO is ok). Patient has Lasix 40 mg po daily and spironolactone 25 mg daily ordered. May need KCL daily while on lasix. Will assess. Will recheck K at 1600 Will f/u labs in am.  Cassey Hurrell A 08/23/2018 7:22 AM

## 2018-08-23 NOTE — Evaluation (Signed)
Occupational Therapy Evaluation Patient Details Name: Morgan [name] MRN: [id number] DOB: [birth year] Today's Date: 08/23/2018    History of Present Illness Patient is a 67 y.o. female who presented to the [facility] emergency room for numbness and heaviness of LUE and LE with heavy feeling of chest radiating to back.  PMH includes previous CVA, anxiety, bipolar, hypertension. CT scan in ER was negative to stroke. MRI pending. Prior to ER patient was independent and ambulating without AD.    Clinical Impression   Pt. presents with 7/10 head, and neck pain, LUE weakness, impaired sensation, impaired proprioceptive input, limited activity tolerance, left sided visual file impairment, and limited functional mobility which limits her ability to complete basic ADL and IADL functioning. Pt. Resides at home alone. Pt. was independent with ADLs, and IADL functioning: including meal preparation, medication management, and driving. Pt. has friends who are able to assist her. Pt. education was provided about positioning, self-ROM, and visual scanning to the left. Pt. could benefit from OT services for ADL training, A/E training, UE neuromuscular re-education, and pt. education about visual compensatory strategies, home modification, and DME. Pt. would benefit from SNF level of care upon discharge. Pt. Could benefit from follow-up OT services at discharge.    Follow Up Recommendations  SNF    Equipment Recommendations       Recommendations for Other Services       Precautions / Restrictions Precautions Precautions: None Restrictions Weight Bearing Restrictions: No      Mobility Bed Mobility Overal bed mobility: Modified Independent             General bed mobility comments: Lifted LLE with hands to bring into bed, unable to bring into bed by self. Used handrail to transition from supine <> sit EOB  Transfers Overall transfer level: Needs assistance Equipment used: Standard walker Transfers: Sit  to/from Stand Sit to Stand: Min guard         General transfer comment: Per PT    Balance                                           ADL either performed or assessed with clinical judgement   ADL Overall ADL's : Needs assistance/impaired Eating/Feeding: Minimal assistance;Set up   Grooming: Set up;Moderate assistance   Upper Body Bathing: Set up;Maximal assistance   Lower Body Bathing: Set up;Maximal assistance   Upper Body Dressing : Set up;Maximal assistance   Lower Body Dressing: Set up;Maximal assistance                       Vision Patient Visual Report: Peripheral vision impairment(Left side visual field deficit) Vision Assessment?: Vision impaired- to be further tested in functional context     Perception     Praxis      Pertinent Vitals/Pain Pain Assessment: 0-10 Pain Score: 7  Pain Location: headache Pain Descriptors / Indicators: Aching Pain Intervention(s): Monitored during session     Hand Dominance Left   Extremity/Trunk Assessment Upper Extremity Assessment Upper Extremity Assessment: LUE deficits/detail LUE Deficits / Details: Left shoulder flexion, abduction 1/5, elbow flexion, extension 2-/5, wrist 0/5, thumb abduction 2/5, digit flexion, extension 1/5 LUE Sensation: decreased light touch;decreased proprioception LUE Coordination: decreased fine motor;decreased gross motor           Communication Communication Communication: No difficulties   Cognition Arousal/Alertness: Awake/alert Behavior During  Therapy: WFL for tasks assessed/performed Overall Cognitive Status: Difficult to assess                                 General Comments: Patient followed complex and simple commands   General Comments       Exercises     Shoulder Instructions      Home Living Family/patient expects to be discharged to:: Private residence Living Arrangements: Other (Comment);Non-relatives/Friends Available  Help at Discharge: Friend(s) Type of Home: House Home Access: Ramped entrance     Home Layout: One level     Bathroom Shower/Tub: Hospital doctor Toilet: Handicapped height     Home Equipment: Environmental consultant - 2 wheels;Toilet riser;Grab bars - tub/shower;Hand held shower head   Additional Comments: 5 steps to enter with bilateral railing, raised toilet seat, patient not using AD      Prior Functioning/Environment          Comments: Patient reports she was independent with ADLs, and IADLs, driving medication management.        OT Problem List: Decreased strength;Decreased range of motion;Impaired UE functional use;Pain;Decreased activity tolerance;Impaired vision/perception;Decreased knowledge of use of DME or AE      OT Treatment/Interventions: Self-care/ADL training;Therapeutic exercise;DME and/or AE instruction;Therapeutic activities;Patient/family education;Neuromuscular education    OT Goals(Current goals can be found in the care plan section) Acute Rehab OT Goals Patient Stated Goal: To return home OT Goal Formulation: With patient Potential to Achieve Goals: Good  OT Frequency: Min 2X/week   Barriers to D/C:            Co-evaluation              AM-PAC PT "6 Clicks" Daily Activity     Outcome Measure Help from another person eating meals?: A Little Help from another person taking care of personal grooming?: A Lot Help from another person toileting, which includes using toliet, bedpan, or urinal?: A Lot Help from another person bathing (including washing, rinsing, drying)?: A Lot Help from another person to put on and taking off regular upper body clothing?: A Lot Help from another person to put on and taking off regular lower body clothing?: A Lot 6 Click Score: 13   End of Session    Activity Tolerance: Patient tolerated treatment well Patient left:    OT Visit Diagnosis: Unsteadiness on feet (R26.81);Pain;Muscle weakness (generalized)  (M62.81);Feeding difficulties (R63.3)                Time: 1340-1405 OT Time Calculation (min): 25 min Charges:  OT General Charges $OT Visit: 1 Visit OT Evaluation $OT Eval Moderate Complexity: 1 Mod  Harrel Carina, MS, OTR/L  Harrel Carina 08/23/2018, 3:28 PM

## 2018-08-23 NOTE — Progress Notes (Signed)
SLP Cancellation Note  Patient Details Name: Morgan [name] MRN: [id number] DOB: 1951-07-30   Cancelled treatment:       Reason Eval/Treat Not Completed: Patient at procedure or test/unavailable(chart reviewed; NSG consulted. Pt leaving for test.). Consulted briefly w/ pt re: any changes from her status. Pt did not state any changes in her speech but noted min low volume and slower rate; intelligibility was 100%. Unsure if related to her previous stroke.  Pt denied any difficulty swallowing and had eaten some of her breakfast and swallowed pills w/ NSG w/out difficulty. HOWEVER, pt c/o a "stuck" feeling pointing to her sternal notch area. She indicated this feeling(globus?) occurred at home prior to her admission describing it as "intermittent". She also stated it caused her to not eat as much. She denied any GI history or reflux-like issues at home. NSG consulted re: pt's report and the recommendation for potential f/u by GI for further assessment and/or tx as indicated as pt discharges. NSG agreed.  ST services will f/u w/ pt's status again on Monday for any speech needs. Pt agreed.    Orinda Kenner, MS, CCC-SLP Morgan [name] 08/23/2018, 12:06 PM

## 2018-08-23 NOTE — Plan of Care (Addendum)
Patient has been struggling with on-going HA since 7AM shift change.  Tylenol x2 doses, immetrex, warm washcloth over eyes, dimmed lights, closed shade, Peppermint oil applied to Rt/Lf temples. back of neck, ice pack and peppermint oil placed at back of neck.  Patient still states 7 of 10 with no resolution. Dr. Darlyn Chamber.  Gave Compazine IV, Depacon IV drip, Robaxin IV drip and another dose Tylenol PO. - Patient reports little relief. (Still reports 6 out of 10 HA pain). Dr will be informed.

## 2018-08-23 NOTE — Progress Notes (Signed)
Advanced care plan.  Purpose of the Encounter: CODE STATUS  Parties in Attendance: Patient herself  Patient's Decision Capacity: Intact  Subjective/Patient's story: Patient 67 year old with history of hypertension hyperlipidemia, previous stroke and hypokalemia presenting with left upper extremity left lower extremity weakness numbness   Objective/Medical story I discussed with the patient regarding her desires for cardiac and pulmonary resuscitation   Goals of care determination: She would like to be a full code    CODE STATUS: Full code   Time spent discussing advanced care planning: 16 minutes

## 2018-08-23 NOTE — Plan of Care (Signed)
Per conversation with patient - she states she's been having a "stuck" feeling in the base of her esophagus. This problem has been occurring at home - prior to this admit. Patient has no s/sx of visible difficulties swallowing. SLP indicates that GI would need to help assess/treat as needed.

## 2018-08-23 NOTE — Progress Notes (Signed)
[place] [facility]                                                                                                                                                                                  Patient Demographics   Morgan [name], is a 67 y.o. female, DOB - [birth year], EUM:[id number]  Admit date - 08/22/2018   Admitting Physician Fritzi Mandes, MD  Outpatient Primary MD for the patient is [name], [name], MD   LOS - 0  Subjective:  She continues to complain of heavy feeling in the left upper and lower extremity.   Review of Systems:   CONSTITUTIONAL: No documented fever. No fatigue, weakness. No weight gain, no weight loss.  EYES: No blurry or double vision.  ENT: No tinnitus. No postnasal drip. No redness of the oropharynx.  RESPIRATORY: No cough, no wheeze, no hemoptysis. No dyspnea.  CARDIOVASCULAR: No chest pain. No orthopnea. No palpitations. No syncope.  GASTROINTESTINAL: No nausea, no vomiting or diarrhea. No abdominal pain. No melena or hematochezia.  Chronic difficulty with swelling GENITOURINARY: No dysuria or hematuria.  ENDOCRINE: No polyuria or nocturia. No heat or cold intolerance.  HEMATOLOGY: No anemia. No bruising. No bleeding.  INTEGUMENTARY: No rashes. No lesions.  MUSCULOSKELETAL: No arthritis. No swelling. No gout.  NEUROLOGIC: Complains of heavy feeling in the left upper and left lower extremity PSYCHIATRIC: No anxiety. No insomnia. No ADD.    Vitals:   Vitals:   08/23/18 0608 08/23/18 0923 08/23/18 0924 08/23/18 1008  BP: 118/66 122/68    Pulse: 60  65 78  Resp: 16     Temp: 98.4 F (36.9 C)     TempSrc: Oral     SpO2: 97%   98%  Weight:      Height:        Wt Readings from Last 3 Encounters:  08/22/18 104.3 kg  05/05/18 106.7 kg  05/02/18 107.5 kg     Intake/Output Summary (Last 24 hours) at 08/23/2018 1151 Last data filed at 08/22/2018 1927 Gross per 24 hour  Intake 100 ml  Output -  Net 100 ml     Physical Exam:   GENERAL: Pleasant-appearing in no apparent distress.  HEAD, EYES, EARS, NOSE AND THROAT: Atraumatic, normocephalic. Extraocular muscles are intact. Pupils equal and reactive to light. Sclerae anicteric. No conjunctival injection. No oro-pharyngeal erythema.  NECK: Supple. There is no jugular venous distention. No bruits, no lymphadenopathy, no thyromegaly.  HEART: Regular rate and rhythm,. No murmurs, no rubs, no clicks.  LUNGS: Clear to auscultation bilaterally. No rales or rhonchi. No wheezes.  ABDOMEN: Soft, flat, nontender, nondistended. Has good bowel sounds. No hepatosplenomegaly appreciated.  EXTREMITIES: No evidence of any cyanosis, clubbing, or peripheral edema.  +2 pedal and radial pulses bilaterally.  NEUROLOGIC: Diminished sensation of the left upper extremity left lower extremity SKIN: Moist and warm with no rashes appreciated.  Psych: Not anxious, depressed LN: No inguinal LN enlargement    Antibiotics   Anti-infectives (From admission, onward)   None      Medications   Scheduled Meds: . amLODipine  5 mg Oral Daily  . aspirin  300 mg Rectal Daily   Or  . aspirin EC  325 mg Oral Daily  . atorvastatin  40 mg Oral QHS  . carvedilol  3.125 mg Oral BID WC  . enoxaparin (LOVENOX) injection  40 mg Subcutaneous Q24H  . furosemide  40 mg Oral Daily  . gabapentin  300 mg Oral QHS  . Influenza vac split quadrivalent PF  0.5 mL Intramuscular Tomorrow-1000  . mirtazapine  45 mg Oral QHS  . spironolactone  25 mg Oral Daily   Continuous Infusions: PRN Meds:.acetaminophen **OR** acetaminophen (TYLENOL) oral liquid 160 mg/5 mL **OR** acetaminophen, albuterol   Data Review:   Micro Results No results found for this or any previous visit (from the past 240 hour(s)).  Radiology Reports Dg Chest 2 View  Result Date: 08/22/2018 CLINICAL DATA:  LEFT side chest pain last night with nausea and vomiting. Shortness of breath. EXAM: CHEST - 2 VIEW COMPARISON:   Chest x-rays dated 05/05/2018 and 11/28/2017. Chest CT dated 05/05/2018. FINDINGS: Heart size and mediastinal contours are stable. Prominent epicardial fat pad, as demonstrated on recent chest CT. Chronic bronchitic changes. Additional coarse lung markings bilaterally, upper lobe predominant, suggesting some degree of chronic interstitial lung disease/fibrosis. No new lung abnormality. No confluent opacity to suggest a developing pneumonia. No pleural effusion or pneumothorax seen. No acute or suspicious osseous finding. IMPRESSION: 1. No active cardiopulmonary disease. No evidence of pneumonia or pulmonary edema. 2. 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 Electronically Signed   By: Franki Cabot M.D.   On: 08/22/2018 13:38   Ct Head Wo Contrast  Result Date: 08/22/2018 CLINICAL DATA:  Sudden onset left-sided numbness last night. EXAM: CT HEAD WITHOUT CONTRAST TECHNIQUE: Contiguous axial images were obtained from the base of the skull through the vertex without intravenous contrast. COMPARISON:  Multiple exams, including 10/04/2017, and MRI from 10/05/2017 FINDINGS: Brain: The brainstem, cerebellum, cerebral peduncles, thalami, basal ganglia, basilar cisterns, and ventricular system appear within normal limits. No intracranial hemorrhage, mass lesion, or acute CVA. Vascular: Unremarkable Skull: Unremarkable Sinuses/Orbits: Unremarkable Other: No supplemental non-categorized findings. IMPRESSION: 1.  No significant abnormality identified. Electronically Signed   By: Van Clines M.D.   On: 08/22/2018 15:20   Ct Angio Chest Pe W And/or Wo Contrast  Result Date: 08/22/2018 CLINICAL DATA:  Sudden onset left-sided numbness and left chest pain. EXAM: CT ANGIOGRAPHY CHEST WITH CONTRAST TECHNIQUE: Multidetector CT imaging of the chest was performed using the standard protocol during bolus administration of intravenous contrast. Multiplanar CT image reconstructions  and MIPs were obtained to evaluate the vascular anatomy. CONTRAST:  52mL OMNIPAQUE IOHEXOL 350 MG/ML SOLN COMPARISON:  CT chest 05/05/2017 and chest radiograph from 08/22/2018 FINDINGS: Cardiovascular: No filling defect is identified in the pulmonary arterial tree to suggest pulmonary embolus. Coronary, aortic arch, and branch vessel atherosclerotic vascular disease. 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 Borderline cardiomegaly. Mediastinum/Nodes: Slightly asymmetric glandular tissues in the breasts, greater in the right upper breast, but  without a discrete focal lesion observed. Small supraclavicular and mediastinal lymph nodes are not pathologically enlarged. Lungs/Pleura: Biapical pleuroparenchymal scarring. Mild ground-glass opacity and bandlike density anteriorly in the right upper lobe for example on image 35/6, new compared to the prior exam of 05/05/2018. Mild airway thickening. Mild scarring anteromedially in the left upper lobe, unchanged, with associated volume loss. There is also some scarring inferiorly in the lingula, likewise stable. A dense calcified granuloma in the left lower lobe adjacent to the diaphragm is stable on image 67/6. No pleural effusion. Upper Abdomen: Unremarkable Musculoskeletal: Old healed left rib fractures. Old healed upper sternal body fracture. Review of the MIP images confirms the above findings. IMPRESSION: 1. No filling defect is identified in the pulmonary arterial tree to suggest pulmonary embolus. 2. Stable appearance of 4.3 cm ascending aortic aneurysm. Recommend annual imaging followup by CTA or MRA. This recommendation follows 2010 ACCF/AHA/AATS/ACR/ASA/SCA/SCAI/SIR/STS/SVM Guidelines for the Diagnosis and Management of Patients with Thoracic Aortic Disease. Circulation. 2010; 121: Y101-B510 3. Mild airway thickening with some ground-glass and bandlike opacities anteriorly in the right upper  lobe which are new from 05/05/2018, and may represent a small region of atypical infection. 4. Aortic Atherosclerosis (ICD10-I70.0). Coronary atherosclerosis with borderline cardiomegaly. Electronically Signed   By: Van Clines M.D.   On: 08/22/2018 15:18   US Carotid Bilateral (at [facility] And [facility] Only)  Result Date: 08/23/2018 CLINICAL DATA:  Stroke. EXAM: BILATERAL CAROTID DUPLEX ULTRASOUND TECHNIQUE: Pearline Cables scale imaging, color Doppler and duplex ultrasound were performed of bilateral carotid and vertebral arteries in the neck. COMPARISON:  10/05/2017 FINDINGS: Criteria: Quantification of carotid stenosis is based on velocity parameters that correlate the residual internal carotid diameter with NASCET-based stenosis levels, using the diameter of the distal internal carotid lumen as the denominator for stenosis measurement. The following velocity measurements were obtained: RIGHT ICA: 144/46 cm/sec CCA: 25/85 cm/sec SYSTOLIC ICA/CCA RATIO:  1.9 ECA: 70 cm/sec LEFT ICA: 128/45 cm/sec CCA: 27/78 cm/sec SYSTOLIC ICA/CCA RATIO:  1.6 ECA: 111 cm/sec RIGHT CAROTID ARTERY: Eccentric partially calcified plaque at the carotid bifurcation extending into the ICA resulting in at least mild stenosis. Mildly elevated peak systolic velocities at the level of stenosis. Otherwise normal waveforms and color Doppler signal. RIGHT VERTEBRAL ARTERY:  Normal flow direction and waveform. LEFT CAROTID ARTERY: Eccentric partially calcified plaque in the bulb. Normal waveforms and color Doppler signal. Mildly tortuous ICA. LEFT VERTEBRAL ARTERY:  Normal flow direction and waveform. IMPRESSION: 1. 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 2.  Antegrade bilateral vertebral arterial flow. Electronically Signed   By: Lucrezia Europe M.D.   On: 08/23/2018 09:15     CBC Recent Labs  Lab 08/22/18 1307  WBC 9.7  HGB 11.1*  HCT 34.0*  PLT 310   MCV 78.5*  MCH 25.7*  MCHC 32.7  RDW 15.3*    Chemistries  Recent Labs  Lab 08/22/18 1307 08/23/18 0357  NA 140 140  K 2.9* 2.8*  CL 101 103  CO2 28 32  GLUCOSE 148* 104*  BUN 16 17  CREATININE 1.53* 1.08*  CALCIUM 8.7* 8.5*  MG 1.8  --    ------------------------------------------------------------------------------------------------------------------ estimated creatinine clearance is 71.3 mL/min (A) (by C-G formula based on SCr of 1.08 mg/dL (H)). ------------------------------------------------------------------------------------------------------------------ No results for input(s): HGBA1C in the last 72 hours. ------------------------------------------------------------------------------------------------------------------ No results for input(s): CHOL, HDL, LDLCALC, TRIG, CHOLHDL, LDLDIRECT in the last 72 hours. ------------------------------------------------------------------------------------------------------------------ No results for  input(s): TSH, T4TOTAL, T3FREE, THYROIDAB in the last 72 hours.  Invalid input(s): FREET3 ------------------------------------------------------------------------------------------------------------------ No results for input(s): VITAMINB12, FOLATE, FERRITIN, TIBC, IRON, RETICCTPCT in the last 72 hours.  Coagulation profile Recent Labs  Lab 08/22/18 1436  INR 1.02    No results for input(s): DDIMER in the last 72 hours.  Cardiac Enzymes Recent Labs  Lab 08/22/18 1307  TROPONINI <0.03   ------------------------------------------------------------------------------------------------------------------ Invalid input(s): POCBNP    Assessment & Plan    Prudy Candy  is a 67 y.o. female with a known history of previous CVA recovered completely, anxiety, bipolar, hypertension comes to the emergency room after she started noticing numbness and heavy feeling in her left upper and lower extremity. Patient came to the emergency  room CT head was negative for stroke.   1. left upper and lower extremity numbness suspected CVA -Continue aspirin 325 mg PO daily -MRI of the brain is not been ordered I will go ahead and order that -ultrasound carotid Doppler results reviewed -Echo of the heart pending -continue statins -PT OT speech therapy  2. hypertension continue home medication  3. Hyperlipidemia continue statins  4.  Dysphagia we will obtain upper GI series to concern for possible stricture  5. Stable ascending thoracic aneurysm at 4.3 according to CT angiogram  6. Hypokalemia Pharmacy consult placed     Code Status Orders  (From admission, onward)         Start     Ordered   08/22/18 1719  Full code  Continuous     08/22/18 1718        Code Status History    Date Active Date Inactive Code Status Order ID Comments User Context   10/05/2017 0052 10/06/2017 1655 Full Code [id number]  Lance Coon, MD Inpatient   08/26/2017 2212 09/12/2017 2032 Full Code [id number]  Lance Coon, MD Inpatient   02/23/2015 0058 02/24/2015 1253 Full Code [id number]  Ivor Costa, MD Inpatient           Consults neurology  DVT Prophylaxis  Lovenox   Lab Results  Component Value Date   PLT 310 08/22/2018     Time Spent in minutes   35 minutes  Greater than 50% of time spent in care coordination and counseling patient regarding the condition and plan of care.   Dustin Flock M.D on 08/23/2018 at 11:51 AM  Between 7am to 6pm - Pager - [phone]  After 6pm go to [email or web address] - Proofreader  [employer]   Office  [phone]

## 2018-08-23 NOTE — Evaluation (Signed)
Physical Therapy Evaluation Patient Details Name: Morgan [name] MRN: [id number] DOB: Dec 16, 1951 Today's Date: 08/23/2018   History of Present Illness  Patient is a 67 y.o. female who presented to emergency room for numbness and heaviness of LUE and LE with heavy feeling of chest radiating to back.  PMH includes previous CVA, anxiety, bipolar, hypertension. CT scan in ER was negative to stroke. MRI pending. Prior to ER patient was independent and ambulating without AD.   Clinical Impression  Patient is a pleasant 67 year old female who presents with limited strength of LLE resulting in poor capacity for mobility and requires contact guard assist in standing. Patient challenged with lifting LLE against gravity stating "it feels heavy and won't do what I ask". Headache increased from 7/10 to 9/10 upon ambulation within room. Patient standing tolerance limited to 2 minutes with CGA and use of RW. Patient will require continued skilled physical therapy to increase LE strength for improved mobility and to decrease fall risk. Patient would benefit from skilled nursing facility due to limited safety in standing and poor activity tolerance secondary to new onset of LLE weakness and instability.     Follow Up Recommendations SNF(patient requires assistance for mobility at this time.)    Equipment Recommendations  Standard walker    Recommendations for Other Services       Precautions / Restrictions Precautions Precautions: None Restrictions Weight Bearing Restrictions: No      Mobility  Bed Mobility Overal bed mobility: Modified Independent             General bed mobility comments: Lifted LLE with hands to bring into bed, unable to bring into bed by self. Used handrail to transition from supine <> sit EOB  Transfers Overall transfer level: Needs assistance Equipment used: Standard walker Transfers: Sit to/from Stand Sit to Stand: Min guard         General transfer comment: Patient  requires CGA due to difficulty with motor activation of LLE and severe headache.   Ambulation/Gait Ambulation/Gait assistance: Min guard Gait Distance (Feet): 9 Feet Assistive device: Standard walker       General Gait Details: Patient has limited L knee and hip flexion, creating a vaulting ambulatory pattern. Fatigued quickly with concurrent increase in headache.   Stairs            Wheelchair Mobility    Modified Rankin (Stroke Patients Only)       Balance Overall balance assessment: Needs assistance Sitting-balance support: Single extremity supported Sitting balance-Leahy Scale: Fair Sitting balance - Comments: able to reach inside and outside BOS with SUE support   Standing balance support: Bilateral upper extremity supported Standing balance-Leahy Scale: Poor Standing balance comment: Requires bilateral UE support due to fear of LLE "giving way"                             Pertinent Vitals/Pain Pain Assessment: 0-10 Pain Score: 7  Pain Location: headache Pain Descriptors / Indicators: Headache Pain Intervention(s): Monitored during session(increased with ambulation)    Home Living Family/patient expects to be discharged to:: Private residence Living Arrangements: Other (Comment);Non-relatives/Friends(roommate) Available Help at Discharge: Friend(s) Type of Home: House Home Access: Ramped entrance(5 stairs )     Home Layout: One level Home Equipment: Walker - 2 wheels;Toilet riser;Grab bars - tub/shower Additional Comments: 5 steps to enter with bilateral railing, raised toilet seat, patient not using AD    Prior Function Level of Independence:  Independent         Comments: Patient reports she was independent with ADLs and mobility without AD.      Hand Dominance   Dominant Hand: Left    Extremity/Trunk Assessment   Upper Extremity Assessment Upper Extremity Assessment: Defer to OT evaluation    Lower Extremity  Assessment Lower Extremity Assessment: Generalized weakness;RLE deficits/detail;LLE deficits/detail RLE Deficits / Details: 4-/5 gross assessment  RLE Sensation: WNL RLE Coordination: WNL LLE Deficits / Details: gross 2/5; difficult for patient to coordinate/control movement: states leg feels too heavy to lift LLE Sensation: decreased light touch LLE Coordination: decreased gross motor       Communication   Communication: No difficulties  Cognition Arousal/Alertness: Awake/alert Behavior During Therapy: WFL for tasks assessed/performed Overall Cognitive Status: Difficult to assess                                 General Comments: Patient followed complex and simple commands however was difficult to ascertain history from.       General Comments General comments (skin integrity, edema, etc.): slight swelling of LE's, no noticeable bruising or open wounds.     Exercises General Exercises - Lower Extremity Ankle Circles/Pumps: AROM;Both;10 reps;Supine(difficulty with LLE) Heel Slides: AROM;Both;5 reps;Supine(difficulty with LLE) Straight Leg Raises: AROM;Strengthening;Both;5 reps(unable to perform with LLE) Other Exercises Other Exercises: static standing tolerance 2 minutes with RW and CGA, decreased weight shift onto LLE noted and corrected for   Assessment/Plan    PT Assessment Patient needs continued PT services  PT Problem List Decreased strength;Decreased activity tolerance;Decreased balance;Decreased mobility;Decreased coordination;Impaired sensation;Pain;Impaired tone       PT Treatment Interventions DME instruction;Gait training;Stair training;Functional mobility training;Neuromuscular re-education;Balance training;Therapeutic exercise;Therapeutic activities;Patient/family education;Manual techniques    PT Goals (Current goals can be found in the Care Plan section)  Acute Rehab PT Goals Patient Stated Goal: to return home PT Goal Formulation: With  patient Time For Goal Achievement: 09/06/18 Potential to Achieve Goals: Fair    Frequency Min 2X/week   Barriers to discharge Decreased caregiver support Patient requires CGA for mobility at this time. Poor capacity for functional mobility.     Co-evaluation               AM-PAC PT "6 Clicks" Daily Activity  Outcome Measure Difficulty turning over in bed (including adjusting bedclothes, sheets and blankets)?: None Difficulty moving from lying on back to sitting on the side of the bed? : A Little Difficulty sitting down on and standing up from a chair with arms (e.g., wheelchair, bedside commode, etc,.)?: A Lot Help needed moving to and from a bed to chair (including a wheelchair)?: None Help needed walking in hospital room?: A Little Help needed climbing 3-5 steps with a railing? : A Lot 6 Click Score: 18    End of Session Equipment Utilized During Treatment: Gait belt Activity Tolerance: Patient tolerated treatment well;Patient limited by pain(headache) Patient left: in bed;with bed alarm set;with nursing/sitter in room;with SCD's reapplied Nurse Communication: Mobility status PT Visit Diagnosis: Unsteadiness on feet (R26.81);Other abnormalities of gait and mobility (R26.89);Muscle weakness (generalized) (M62.81)    Time: 7035-0093 PT Time Calculation (min) (ACUTE ONLY): 24 min   Charges:   PT Evaluation $PT Eval Low Complexity: 1 Low PT Treatments $Gait Training: 8-22 mins        Janna Arch, PT, DPT    Janna Arch 08/23/2018, 10:29 AM

## 2018-08-23 NOTE — Progress Notes (Signed)
Pharmacy Electrolyte Monitoring Consult:  Pharmacy consulted to assist in monitoring and replacing electrolytes in this 67 y.o. female admitted on 08/22/2018 with Chest Pain   Labs:  Sodium (mmol/L)  Date Value  08/23/2018 140  09/18/2012 139   Potassium (mmol/L)  Date Value  08/23/2018 2.8 (L)  09/18/2012 3.3 (L)   Magnesium (mg/dL)  Date Value  08/22/2018 1.8   Phosphorus (mg/dL)  Date Value  09/06/2017 3.8   Calcium (mg/dL)  Date Value  08/23/2018 8.5 (L)   Calcium, Total (mg/dL)  Date Value  09/18/2012 9.4   Albumin (g/dL)  Date Value  10/04/2017 3.1 (L)  09/18/2012 2.8 (L)   Patient on lasix and spironolactone PTA  Assessment/Plan: 09/07 am: K 2.8   KCL 40 meq PO q2h x 2 ordered 09/07 pm: K 3.6   No further replacement warranted at this time  Will f/u labs in am.  Dallie Piles, PharmD 08/23/2018 4:15 PM

## 2018-08-23 NOTE — Consult Note (Signed)
[facility]    Provider:  Dr Jaynee Eagles Primary Care Physician:  Javier Docker, MD  CC:  Sudden left-sided numbness  HPI:  Morgan [name] is a 67 y.o. female who presented to [facility] with left-sided numbness and weakness in the setting of headache. PMHx previous CVA recovered completely, anxiety, bipolar, hypertension. CT head negative. MRI without acute events.  Review of Systems: Patient complains of symptoms per HPI as well as the following symptoms: headache. Pertinent negatives and positives per HPI. All others negative.   Social History   Socioeconomic History  . Marital status: Single    Spouse name: Not on file  . Number of children: Not on file  . Years of education: Not on file  . Highest education level: Not on file  Occupational History  . Not on file  Social Needs  . Financial resource strain: Not on file  . Food insecurity:    Worry: Not on file    Inability: Not on file  . Transportation needs:    Medical: Not on file    Non-medical: Not on file  Tobacco Use  . Smoking status: Former Smoker    Types: Cigarettes  . Smokeless tobacco: Never Used  Substance and Sexual Activity  . Alcohol use: Yes  . Drug use: No  . Sexual activity: Not on file  Lifestyle  . Physical activity:    Days per week: Not on file    Minutes per session: Not on file  . Stress: Not on file  Relationships  . Social connections:    Talks on phone: Not on file    Gets together: Not on file    Attends religious service: Not on file    Active member of club or organization: Not on file    Attends meetings of clubs or organizations: Not on file    Relationship status: Not on file  . Intimate partner violence:    Fear of current or ex partner: Not on file    Emotionally abused: Not on file    Physically abused: Not on file    Forced sexual activity: Not on file  Other Topics Concern  . Not on file  Social History Narrative  . Not on file    Family  History  Problem Relation Age of Onset  . Breast cancer Mother   . Ovarian cancer Mother   . Hypertension Father   . Hyperlipidemia Father   . Heart disease Father   . Breast cancer Maternal Grandmother   . Cancer Paternal Grandmother   . Diabetes Paternal Grandmother   . Cancer Paternal Grandfather   . Breast cancer Maternal Aunt   . Breast cancer Maternal Aunt     Past Medical History:  Diagnosis Date  . Allergy   . Anemia   . Anxiety   . Arthritis   . Bipolar 1 disorder ([place])   . Depression   . GERD (gastroesophageal reflux disease)   . Hyperlipidemia   . Hypertension   . MVP (mitral valve prolapse)     Past Surgical History:  Procedure Laterality Date  . ABDOMINAL HYSTERECTOMY    . APPENDECTOMY    . BREAST SURGERY Right 1988   tumor removal  . KNEE SURGERY  2005   2 times left  . TUBAL LIGATION      Current Facility-Administered Medications  Medication Dose Route Frequency Provider Last Rate Last Dose  . acetaminophen (TYLENOL) tablet 650 mg  650 mg Oral Q4H PRN  Fritzi Mandes, MD   650 mg at 08/23/18 0930   Or  . acetaminophen (TYLENOL) solution 650 mg  650 mg Per Tube Q4H PRN Fritzi Mandes, MD       Or  . acetaminophen (TYLENOL) suppository 650 mg  650 mg Rectal Q4H PRN Fritzi Mandes, MD      . albuterol (PROVENTIL) (2.5 MG/3ML) 0.083% nebulizer solution 2.5 mg  2.5 mg Inhalation Q4H PRN Fritzi Mandes, MD      . amLODipine (NORVASC) tablet 5 mg  5 mg Oral Daily Fritzi Mandes, MD   5 mg at 08/23/18 1700  . aspirin suppository 300 mg  300 mg Rectal Daily Fritzi Mandes, MD       Or  . aspirin EC tablet 325 mg  325 mg Oral Daily Fritzi Mandes, MD   325 mg at 08/23/18 0923  . atorvastatin (LIPITOR) tablet 40 mg  40 mg Oral QHS Fritzi Mandes, MD   40 mg at 08/22/18 2017  . carvedilol (COREG) tablet 3.125 mg  3.125 mg Oral BID WC Fritzi Mandes, MD   3.125 mg at 08/23/18 0924  . enoxaparin (LOVENOX) injection 40 mg  40 mg Subcutaneous Q24H Fritzi Mandes, MD   40 mg at 08/22/18 2017  .  furosemide (LASIX) tablet 40 mg  40 mg Oral Daily Fritzi Mandes, MD   40 mg at 08/23/18 0923  . gabapentin (NEURONTIN) capsule 300 mg  300 mg Oral QHS Fritzi Mandes, MD   300 mg at 08/22/18 2017  . Influenza vac split quadrivalent PF (FLUZONE HIGH-DOSE) injection 0.5 mL  0.5 mL Intramuscular Tomorrow-1000 Fritzi Mandes, MD      . ketorolac (TORADOL) 30 MG/ML injection 30 mg  30 mg Intravenous Q8H PRN Dustin Flock, MD      . mirtazapine (REMERON) tablet 45 mg  45 mg Oral QHS Fritzi Mandes, MD   45 mg at 08/22/18 2018  . spironolactone (ALDACTONE) tablet 25 mg  25 mg Oral Daily Fritzi Mandes, MD   25 mg at 08/23/18 1749    Allergies as of 08/22/2018 - Review Complete 08/22/2018  Allergen Reaction Noted  . Demerol [meperidine] Hives 04/28/2013  . Strawberry (diagnostic) Hives 08/26/2017    Vitals: BP 131/72 (BP Location: Right Arm)   Pulse 60   Temp 97.6 F (36.4 C) (Oral)   Resp 20   Ht 6' 1"  (1.854 m)   Wt 104.3 kg   SpO2 98%   BMI 30.34 kg/m  Last Weight:  Wt Readings from Last 1 Encounters:  08/22/18 104.3 kg   Last Height:   Ht Readings from Last 1 Encounters:  08/22/18 6' 1"  (1.854 m)   Physical exam: Exam: Gen: appears distressed with headache                   CV: RRR, no MRG. No Carotid Bruits. No peripheral edema, warm, nontender Eyes: Conjunctivae clear without exudates or hemorrhage  Neuro: Detailed Neurologic Exam  Speech:    Speech is normal; fluent and spontaneous with normal comprehension.  Cognition:    The patient is oriented to person, place, and time;   Cranial Nerves:    The pupils are equal, round, and reactive to light. Attempted fundoscopic exam could not visualize due to noncooperation.  Visual fields are full to finger confrontation. Extraocular movements are intact. Trigeminal sensation is intact and the muscles of mastication are normal. The face is symmetric. The palate elevates in the midline. Hearing intact. Voice is normal. Shoulder shrug is  normal. The tongue has normal motion without fasciculations.   Coordination:    No dysmetria  Gait:    Deferred  Motor Observation:    No asymmetry, no atrophy, and no involuntary movements noted. Tone:    Decreased left extremity    Strength: poor effort left arm appears weak otherwise other 3 limbs anti-gravity and equal.       Sensation: decreased in the left upper extremity     Reflex Exam:  DTR's:    Deep tendon reflexes in the upper and lower extremities are symmetrical bilaterally.   Toes:    The toes are equiv bilaterally.   Clonus:    Clonus is absent.  MRI brain/MRA head: ersonally reviewed images amd agree with the following:  1. No acute intracranial abnormality. 2. Scattered periventricular and subcortical T2 hyperintensities bilaterally are mildly advanced for age. The finding is nonspecific but can be seen in the setting of chronic microvascular ischemia, a demyelinating process such as multiple sclerosis, vasculitis, complicated migraine headaches, or as the sequelae of a prior infectious or inflammatory process. 3. Normal MRA circle-of-Willis without significant proximal stenosis, aneurysm, or branch vessel occlusion.     Assessment/Plan:  CIENA SAMPLEY is a 67 y.o. female who presented to [place] with left-sided numbness and weakness in the setting of headache. PMHx previous CVA recovered completely, anxiety, bipolar, hypertension. CT head negative. MRI without acute events.  - Likely a complicated migraine. She has pulsating headache with light/sound sensitivity, nausea. Weakness of the left arm started in the setting of migrainous headache. No new stroke on MRI.  - previous episodes of right hemiparesis in the setting of neck and head pain in 09/2017 as well may have been due to complicated migraine  - manage migraine symptomatically, needs physical therapy, metabolic/infectious workup  and needs follow up outpatient to further explore and treat for  migraines.  - check esr/crp but very unlikely [facility]  - Please call if neurology needed to follow. Call if recommendation for acute migraine management needed.    Sarina Ill, MD [facility]

## 2018-08-24 LAB — BASIC METABOLIC PANEL
Anion gap: 10 (ref 5–15)
BUN: 18 mg/dL (ref 8–23)
CHLORIDE: 102 mmol/L (ref 98–111)
CO2: 28 mmol/L (ref 22–32)
CREATININE: 1.24 mg/dL — AB (ref 0.44–1.00)
Calcium: 8.7 mg/dL — ABNORMAL LOW (ref 8.9–10.3)
GFR calc non Af Amer: 45 mL/min — ABNORMAL LOW (ref >60)
GFR, EST AFRICAN AMERICAN: 52 mL/min — AB (ref >60)
GLUCOSE: 102 mg/dL — AB (ref 70–99)
Potassium: 3.6 mmol/L (ref 3.5–5.1)
Sodium: 140 mmol/L (ref 135–145)

## 2018-08-24 MED ORDER — POTASSIUM CHLORIDE CRYS ER 20 MEQ PO TBCR
20.0000 meq | EXTENDED_RELEASE_TABLET | Freq: Once | ORAL | Status: AC
  Administered 2018-08-24: 11:00:00 20 meq via ORAL
  Filled 2018-08-24: qty 1

## 2018-08-24 NOTE — Care Management Note (Signed)
Case Management Note  Patient Details  Name: Morgan [name] MRN: [id number] Date of Birth: 05-27-1951  Subjective/Objective:  Patient to be discharged per MD order. Orders in place for home health services. Patient agreeable to home health services. Per request referral placed with Malachy Mood from [facility] who accepts the patient for PT and aide services. DME walker obtained from [place]. No further needs, family to provide transport.  Ines Bloomer RN BSN RNCM [phone]                    Action/Plan:   Expected Discharge Date:  08/24/18               Expected Discharge Plan:  [place]  In-House Referral:     Discharge planning Services  CM Consult  Post Acute Care Choice:  Home Health, Durable Medical Equipment Choice offered to:  Patient  DME Arranged:  Walker rolling DME Agency:  [place] Arranged:  PT, Nurse's Aide [place] Agency:  [place]  Status of Service:  Completed, signed off  If discussed at [place] of Stay Meetings, dates discussed:    Additional Comments:  Latanya Maudlin, RN 08/24/2018, 12:14 PM

## 2018-08-24 NOTE — Discharge Summary (Signed)
[employer] [facility] [place], 67 y.o., DOB [birth year], MRN [id number]. Admission date: 08/22/2018 Discharge Date 08/24/2018 Primary MD [name], [name], MD Admitting Physician Fritzi Mandes, MD  Admission Diagnosis  Cerebrovascular accident (CVA), unspecified mechanism [facility]) [I63.9] Chest pain, unspecified type [R07.9]  Discharge Diagnosis   Active Problems: Left upper extremity and left lower extremity numbness this was related to a complex migraine stroke ruled out Essential hypertension Hyperlipidemia Dysphasia outpatient GI follow-up Stable ascending thoracic aneurysm outpatient follow-up with vascular primary care provider needs to arrange Hypokalemia Anxiety disorder Bipolar disorder  Hospital Course Morgan [name] a67 y.o.femalewith a known history of previous CVA recovered completely, anxiety, bipolar, hypertension comes to the emergency room after she started noticing numbness and heavy feeling in her left upper and lower extremity. Patient came to the emergency room CT head was negative for stroke.  Patient was admitted and continued to have symptoms therefore was seen by neurology and had an MRI of the brain which showed no evidence of stroke.  Patient had severe migraine her migraine was treated.  Now symptoms significantly improved.  She continues to complain of weakness therefore home PT will be arranged.  She has a neurologist that she needs to follow-up for her migraines. She also complained of dysphasia I will have her see GI as outpatient for the dysphasia.            Consults  neurology  Significant Tests:  See full reports for all details     Dg Chest 2 View  Result Date: 08/22/2018 CLINICAL DATA:  LEFT side chest pain last night with nausea and vomiting. Shortness of breath. EXAM: CHEST - 2 VIEW COMPARISON:  Chest x-rays dated 05/05/2018 and 11/28/2017. Chest CT dated 05/05/2018. FINDINGS: Heart size and mediastinal  contours are stable. Prominent epicardial fat pad, as demonstrated on recent chest CT. Chronic bronchitic changes. Additional coarse lung markings bilaterally, upper lobe predominant, suggesting some degree of chronic interstitial lung disease/fibrosis. No new lung abnormality. No confluent opacity to suggest a developing pneumonia. No pleural effusion or pneumothorax seen. No acute or suspicious osseous finding. IMPRESSION: 1. No active cardiopulmonary disease. No evidence of pneumonia or pulmonary edema. 2. 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 Electronically Signed   By: Franki Cabot M.D.   On: 08/22/2018 13:38   Ct Head Wo Contrast  Result Date: 08/22/2018 CLINICAL DATA:  Sudden onset left-sided numbness last night. EXAM: CT HEAD WITHOUT CONTRAST TECHNIQUE: Contiguous axial images were obtained from the base of the skull through the vertex without intravenous contrast. COMPARISON:  Multiple exams, including 10/04/2017, and MRI from 10/05/2017 FINDINGS: Brain: The brainstem, cerebellum, cerebral peduncles, thalami, basal ganglia, basilar cisterns, and ventricular system appear within normal limits. No intracranial hemorrhage, mass lesion, or acute CVA. Vascular: Unremarkable Skull: Unremarkable Sinuses/Orbits: Unremarkable Other: No supplemental non-categorized findings. IMPRESSION: 1.  No significant abnormality identified. Electronically Signed   By: Van Clines M.D.   On: 08/22/2018 15:20   Ct Angio Chest Pe W And/or Wo Contrast  Result Date: 08/22/2018 CLINICAL DATA:  Sudden onset left-sided numbness and left chest pain. EXAM: CT ANGIOGRAPHY CHEST WITH CONTRAST TECHNIQUE: Multidetector CT imaging of the chest was performed using the standard protocol during bolus administration of intravenous contrast. Multiplanar CT image reconstructions and MIPs were obtained to evaluate the vascular anatomy. CONTRAST:  54mL OMNIPAQUE IOHEXOL 350 MG/ML SOLN  COMPARISON:  CT chest 05/05/2017 and chest radiograph from 08/22/2018 FINDINGS: Cardiovascular: No filling defect  is identified in the pulmonary arterial tree to suggest pulmonary embolus. Coronary, aortic arch, and branch vessel atherosclerotic vascular disease. 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 Borderline cardiomegaly. Mediastinum/Nodes: Slightly asymmetric glandular tissues in the breasts, greater in the right upper breast, but without a discrete focal lesion observed. Small supraclavicular and mediastinal lymph nodes are not pathologically enlarged. Lungs/Pleura: Biapical pleuroparenchymal scarring. Mild ground-glass opacity and bandlike density anteriorly in the right upper lobe for example on image 35/6, new compared to the prior exam of 05/05/2018. Mild airway thickening. Mild scarring anteromedially in the left upper lobe, unchanged, with associated volume loss. There is also some scarring inferiorly in the lingula, likewise stable. A dense calcified granuloma in the left lower lobe adjacent to the diaphragm is stable on image 67/6. No pleural effusion. Upper Abdomen: Unremarkable Musculoskeletal: Old healed left rib fractures. Old healed upper sternal body fracture. Review of the MIP images confirms the above findings. IMPRESSION: 1. No filling defect is identified in the pulmonary arterial tree to suggest pulmonary embolus. 2. Stable appearance of 4.3 cm ascending aortic aneurysm. Recommend annual imaging followup by CTA or MRA. This recommendation follows 2010 ACCF/AHA/AATS/ACR/ASA/SCA/SCAI/SIR/STS/SVM Guidelines for the Diagnosis and Management of Patients with Thoracic Aortic Disease. Circulation. 2010; 121: Z610-R604 3. Mild airway thickening with some ground-glass and bandlike opacities anteriorly in the right upper lobe which are new from 05/05/2018, and may represent a small region of atypical infection. 4. Aortic  Atherosclerosis (ICD10-I70.0). Coronary atherosclerosis with borderline cardiomegaly. Electronically Signed   By: Van Clines M.D.   On: 08/22/2018 15:18   Mr Jodene Nam Head Wo Contrast  Result Date: 08/23/2018 CLINICAL DATA:  Neuro deficits, subacute. Heaviness of the left upper and lower extremity upon waking today. Personal history of CVA. Headaches over the last several days. Numbness in the left upper and lower extremities. Strength is preserved. EXAM: MRI HEAD WITHOUT CONTRAST MRA HEAD WITHOUT CONTRAST TECHNIQUE: Multiplanar, multiecho pulse sequences of the brain and surrounding structures were obtained without intravenous contrast. Angiographic images of the head were obtained using MRA technique without contrast. COMPARISON:  CT head without contrast 08/22/2018. MRI brain 10/05/2017 FINDINGS: MRI HEAD FINDINGS Brain: The diffusion-weighted images demonstrate no acute or subacute infarction. Scattered periventricular and subcortical T2 hyperintensities are mildly advanced for age. No acute hemorrhage or mass lesion is present. The ventricles are of normal size. No significant extra-axial fluid collection is present. The brainstem and cerebellum are normal. Vascular: Flow is present in the major intracranial arteries. Skull and upper cervical spine: The skull base is within normal limits. The craniocervical junction is normal. Upper cervical spine is within normal limits. Sinuses/Orbits: The paranasal sinuses and mastoid air cells are clear. Globes and orbits are within normal limits. MRA HEAD FINDINGS Internal carotid arteries are within normal limits from the high cervical segments through the ICA termini. The A1 and M1 segments are normal. No definite anterior communicating artery is patent. There is noted bifurcation of the right MCA, a normal variant. The ACA and MCA branch vessels are within normal limits. The vertebral arteries are codominant. PICA origin is visualized and within normal limits  bilaterally. The basilar artery is normal. Both posterior cerebral arteries originate from the basilar tip. PCA branch vessels are within normal limits. IMPRESSION: 1. No acute intracranial abnormality. 2. Scattered periventricular and subcortical T2 hyperintensities bilaterally are mildly advanced for age. The finding is nonspecific but can be seen in the setting of chronic microvascular ischemia, a  demyelinating process such as multiple sclerosis, vasculitis, complicated migraine headaches, or as the sequelae of a prior infectious or inflammatory process. 3. Normal MRA circle-of-Willis without significant proximal stenosis, aneurysm, or branch vessel occlusion. Electronically Signed   By: San Morelle M.D.   On: 08/23/2018 12:38   Mr Brain Wo Contrast  Result Date: 08/23/2018 CLINICAL DATA:  Neuro deficits, subacute. Heaviness of the left upper and lower extremity upon waking today. Personal history of CVA. Headaches over the last several days. Numbness in the left upper and lower extremities. Strength is preserved. EXAM: MRI HEAD WITHOUT CONTRAST MRA HEAD WITHOUT CONTRAST TECHNIQUE: Multiplanar, multiecho pulse sequences of the brain and surrounding structures were obtained without intravenous contrast. Angiographic images of the head were obtained using MRA technique without contrast. COMPARISON:  CT head without contrast 08/22/2018. MRI brain 10/05/2017 FINDINGS: MRI HEAD FINDINGS Brain: The diffusion-weighted images demonstrate no acute or subacute infarction. Scattered periventricular and subcortical T2 hyperintensities are mildly advanced for age. No acute hemorrhage or mass lesion is present. The ventricles are of normal size. No significant extra-axial fluid collection is present. The brainstem and cerebellum are normal. Vascular: Flow is present in the major intracranial arteries. Skull and upper cervical spine: The skull base is within normal limits. The craniocervical junction is normal.  Upper cervical spine is within normal limits. Sinuses/Orbits: The paranasal sinuses and mastoid air cells are clear. Globes and orbits are within normal limits. MRA HEAD FINDINGS Internal carotid arteries are within normal limits from the high cervical segments through the ICA termini. The A1 and M1 segments are normal. No definite anterior communicating artery is patent. There is noted bifurcation of the right MCA, a normal variant. The ACA and MCA branch vessels are within normal limits. The vertebral arteries are codominant. PICA origin is visualized and within normal limits bilaterally. The basilar artery is normal. Both posterior cerebral arteries originate from the basilar tip. PCA branch vessels are within normal limits. IMPRESSION: 1. No acute intracranial abnormality. 2. Scattered periventricular and subcortical T2 hyperintensities bilaterally are mildly advanced for age. The finding is nonspecific but can be seen in the setting of chronic microvascular ischemia, a demyelinating process such as multiple sclerosis, vasculitis, complicated migraine headaches, or as the sequelae of a prior infectious or inflammatory process. 3. Normal MRA circle-of-Willis without significant proximal stenosis, aneurysm, or branch vessel occlusion. Electronically Signed   By: San Morelle M.D.   On: 08/23/2018 12:38   US Carotid Bilateral (at [facility] Only)  Result Date: 08/23/2018 CLINICAL DATA:  Stroke. EXAM: BILATERAL CAROTID DUPLEX ULTRASOUND TECHNIQUE: Pearline Cables scale imaging, color Doppler and duplex ultrasound were performed of bilateral carotid and vertebral arteries in the neck. COMPARISON:  10/05/2017 FINDINGS: Criteria: Quantification of carotid stenosis is based on velocity parameters that correlate the residual internal carotid diameter with NASCET-based stenosis levels, using the diameter of the distal internal carotid lumen as the denominator for stenosis measurement. The following velocity measurements  were obtained: RIGHT ICA: 144/46 cm/sec CCA: 62/70 cm/sec SYSTOLIC ICA/CCA RATIO:  1.9 ECA: 70 cm/sec LEFT ICA: 128/45 cm/sec CCA: 35/00 cm/sec SYSTOLIC ICA/CCA RATIO:  1.6 ECA: 111 cm/sec RIGHT CAROTID ARTERY: Eccentric partially calcified plaque at the carotid bifurcation extending into the ICA resulting in at least mild stenosis. Mildly elevated peak systolic velocities at the level of stenosis. Otherwise normal waveforms and color Doppler signal. RIGHT VERTEBRAL ARTERY:  Normal flow direction and waveform. LEFT CAROTID ARTERY: Eccentric partially calcified plaque in the bulb. Normal waveforms and color Doppler  signal. Mildly tortuous ICA. LEFT VERTEBRAL ARTERY:  Normal flow direction and waveform. IMPRESSION: 1. 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 2.  Antegrade bilateral vertebral arterial flow. Electronically Signed   By: Lucrezia Europe M.D.   On: 08/23/2018 09:15       Today   Subjective:   Morgan [name] she states that headache is gone the numbness on the left side is resolved Objective:   Blood pressure 129/68, pulse 66, temperature 98.5 F (36.9 C), temperature source Oral, resp. rate 16, height 6\' 1"  (1.854 m), weight 104.3 kg, SpO2 95 %.  .  Intake/Output Summary (Last 24 hours) at 08/24/2018 1152 Last data filed at 08/23/2018 1832 Gross per 24 hour  Intake 106.64 ml  Output -  Net 106.64 ml    Exam VITAL SIGNS: Blood pressure 129/68, pulse 66, temperature 98.5 F (36.9 C), temperature source Oral, resp. rate 16, height 6\' 1"  (1.854 m), weight 104.3 kg, SpO2 95 %.  GENERAL:  67 y.o.-year-old patient lying in the bed with no acute distress.  EYES: Pupils equal, round, reactive to light and accommodation. No scleral icterus. Extraocular muscles intact.  HEENT: Head atraumatic, normocephalic. Oropharynx and nasopharynx clear.  NECK:  Supple, no jugular venous distention. No thyroid  enlargement, no tenderness.  LUNGS: Normal breath sounds bilaterally, no wheezing, rales,rhonchi or crepitation. No use of accessory muscles of respiration.  CARDIOVASCULAR: S1, S2 normal. No murmurs, rubs, or gallops.  ABDOMEN: Soft, nontender, nondistended. Bowel sounds present. No organomegaly or mass.  EXTREMITIES: No pedal edema, cyanosis, or clubbing.  NEUROLOGIC: Cranial nerves II through XII are intact. Muscle strength 5/5 in all extremities. Sensation intact. Gait not checked.  PSYCHIATRIC: The patient is alert and oriented x 3.  SKIN: No obvious rash, lesion, or ulcer.   Data Review     CBC w Diff:  Lab Results  Component Value Date   WBC 9.7 08/22/2018   HGB 11.1 (L) 08/22/2018   HGB 12.4 09/18/2012   HCT 34.0 (L) 08/22/2018   HCT 37.3 09/18/2012   PLT 310 08/22/2018   PLT 450 (H) 09/18/2012   LYMPHOPCT 35 10/04/2017   MONOPCT 7 10/04/2017   EOSPCT 5 10/04/2017   BASOPCT 0 10/04/2017   CMP:  Lab Results  Component Value Date   NA 140 08/24/2018   NA 139 09/18/2012   K 3.6 08/24/2018   K 3.3 (L) 09/18/2012   CL 102 08/24/2018   CL 99 09/18/2012   CO2 28 08/24/2018   CO2 33 (H) 09/18/2012   BUN 18 08/24/2018   BUN 13 09/18/2012   CREATININE 1.24 (H) 08/24/2018   CREATININE 0.72 09/18/2012   PROT 6.6 10/04/2017   PROT 7.3 09/18/2012   ALBUMIN 3.1 (L) 10/04/2017   ALBUMIN 2.8 (L) 09/18/2012   BILITOT 0.7 10/04/2017   BILITOT 0.5 09/18/2012   ALKPHOS 152 (H) 10/04/2017   ALKPHOS 163 (H) 09/18/2012   AST 21 10/04/2017   AST 19 09/18/2012   ALT 14 10/04/2017   ALT 10 (L) 09/18/2012  .  Micro Results No results found for this or any previous visit (from the past 240 hour(s)).      Code Status Orders  (From admission, onward)         Start     Ordered   08/22/18 1719  Full code  Continuous     08/22/18 1718        Code  Status History    Date Active Date Inactive Code Status Order ID Comments User Context   10/05/2017 0052 10/06/2017 1655  Full Code [id number]  Lance Coon, MD Inpatient   08/26/2017 2212 09/12/2017 2032 Full Code [id number]  Lance Coon, MD Inpatient   02/23/2015 0058 02/24/2015 1253 Full Code [id number]  Ivor Costa, MD Inpatient          Follow-up Information    [name], [name], MD Follow up in 1 week(s).   Specialty:  Internal Medicine Contact information: [address] [place] [address] [phone]        Efrain Sella, MD Follow up in 2 week(s).   Specialty:  Gastroenterology Why:  dysphagia Contact information: [place] [address] [phone]        Vladimir Crofts, MD Follow up in 2 week(s).   Specialty:  Neurology Contact information: [facility] [place] [address] [phone]           Discharge Medications   Allergies as of 08/24/2018      Reactions   Demerol [meperidine] Hives   Strawberry (diagnostic) Hives      Medication List    TAKE these medications   acetaminophen 500 MG tablet Commonly known as:  TYLENOL Take 1,000 mg by mouth every 6 (six) hours as needed for moderate pain (pain).   albuterol 108 (90 Base) MCG/ACT inhaler Commonly known as:  PROVENTIL HFA;VENTOLIN HFA Inhale 2 puffs into the lungs every 4 (four) hours as needed for wheezing or shortness of breath.   amLODipine 5 MG tablet Commonly known as:  NORVASC Take 5 mg by mouth daily.   aspirin 325 MG EC tablet Take 1 tablet (325 mg total) by mouth daily.   atorvastatin 40 MG tablet Commonly known as:  LIPITOR Take 40 mg by mouth at bedtime.   carvedilol 3.125 MG tablet Commonly known as:  COREG Take 3.125 mg by mouth 2 (two) times daily with a meal.   furosemide 40 MG tablet Commonly known as:  LASIX Take 40 mg by mouth.   gabapentin 300 MG capsule Commonly known as:  NEURONTIN Take 1 capsule (300 mg total) by mouth 3 (three) times daily as needed. What changed:  when to take this    mirtazapine 45 MG tablet Commonly known as:  REMERON Take 45 mg by mouth at bedtime.   spironolactone 25 MG tablet Commonly known as:  ALDACTONE Take 25 mg by mouth daily.            Durable Medical Equipment  (From admission, onward)         Start     Ordered   08/24/18 1014  For home use only DME Walker  Once    Question:  Patient needs a walker to treat with the following condition  Answer:  Gait abnormality   08/24/18 1013             Total Time in preparing paper work, data evaluation and todays exam - 71 minutes  Dustin Flock M.D on 08/24/2018 at 11:52 AM [place]  [phone]

## 2018-08-24 NOTE — Progress Notes (Signed)
Pharmacy Electrolyte Monitoring Consult:  Pharmacy consulted to assist in monitoring and replacing electrolytes in this 67 y.o. female admitted on 08/22/2018 with Chest Pain   Labs:  Sodium (mmol/L)  Date Value  08/24/2018 140  09/18/2012 139   Potassium (mmol/L)  Date Value  08/24/2018 3.6  09/18/2012 3.3 (L)   Magnesium (mg/dL)  Date Value  08/22/2018 1.8   Phosphorus (mg/dL)  Date Value  09/06/2017 3.8   Calcium (mg/dL)  Date Value  08/24/2018 8.7 (L)   Calcium, Total (mg/dL)  Date Value  09/18/2012 9.4   Albumin (g/dL)  Date Value  10/04/2017 3.1 (L)  09/18/2012 2.8 (L)   Patient on lasix and spironolactone PTA  Assessment/Plan: 09/07 am: K 2.8   KCL 40 meq PO q2h x 2 ordered 09/07 pm: K 3.6   No further replacement warranted at this time  9/8 am: K 3.6  Scr 1.24 Patient on lasix 40 daily and spironolactone 25 daily. Will order KCL 20 meq po x 1 as patient on lasix.  Will f/u labs in am.  Noralee Space, PharmD 08/24/2018 8:17 AM

## 2018-09-08 ENCOUNTER — Emergency Department
Admission: EM | Admit: 2018-09-08 | Discharge: 2018-09-08 | Disposition: A | Discharge status: Home / Self Care | Payer: Medicare HMO | Attending: Emergency Medicine | Admitting: Emergency Medicine

## 2018-09-08 ENCOUNTER — Other Ambulatory Visit: Payer: Self-pay

## 2018-09-08 DIAGNOSIS — Z7982 Long term (current) use of aspirin: Secondary | ICD-10-CM | POA: Diagnosis not present

## 2018-09-08 DIAGNOSIS — H53149 Visual discomfort, unspecified: Secondary | ICD-10-CM | POA: Diagnosis not present

## 2018-09-08 DIAGNOSIS — R110 Nausea: Secondary | ICD-10-CM | POA: Insufficient documentation

## 2018-09-08 DIAGNOSIS — R200 Anesthesia of skin: Secondary | ICD-10-CM | POA: Diagnosis not present

## 2018-09-08 DIAGNOSIS — Z79899 Other long term (current) drug therapy: Secondary | ICD-10-CM | POA: Diagnosis not present

## 2018-09-08 DIAGNOSIS — I10 Essential (primary) hypertension: Secondary | ICD-10-CM | POA: Insufficient documentation

## 2018-09-08 DIAGNOSIS — Z87891 Personal history of nicotine dependence: Secondary | ICD-10-CM | POA: Diagnosis not present

## 2018-09-08 DIAGNOSIS — Z8673 Personal history of transient ischemic attack (TIA), and cerebral infarction without residual deficits: Secondary | ICD-10-CM | POA: Insufficient documentation

## 2018-09-08 DIAGNOSIS — R202 Paresthesia of skin: Secondary | ICD-10-CM | POA: Insufficient documentation

## 2018-09-08 DIAGNOSIS — G43109 Migraine with aura, not intractable, without status migrainosus: Secondary | ICD-10-CM | POA: Diagnosis not present

## 2018-09-08 DIAGNOSIS — R51 Headache: Secondary | ICD-10-CM | POA: Diagnosis present

## 2018-09-08 LAB — URINALYSIS, COMPLETE (UACMP) WITH MICROSCOPIC
Bilirubin Urine: NEGATIVE
Glucose, UA: NEGATIVE mg/dL
Ketones, ur: NEGATIVE mg/dL
NITRITE: NEGATIVE
PH: 5 (ref 5.0–8.0)
Protein, ur: NEGATIVE mg/dL
SPECIFIC GRAVITY, URINE: 1.01 (ref 1.005–1.030)

## 2018-09-08 LAB — BASIC METABOLIC PANEL
ANION GAP: 13 (ref 5–15)
BUN: 21 mg/dL (ref 8–23)
CALCIUM: 9 mg/dL (ref 8.9–10.3)
CO2: 28 mmol/L (ref 22–32)
Chloride: 98 mmol/L (ref 98–111)
Creatinine, Ser: 1.23 mg/dL — ABNORMAL HIGH (ref 0.44–1.00)
GFR calc non Af Amer: 45 mL/min — ABNORMAL LOW (ref >60)
GFR, EST AFRICAN AMERICAN: 52 mL/min — AB (ref >60)
Glucose, Bld: 125 mg/dL — ABNORMAL HIGH (ref 70–99)
Potassium: 3.2 mmol/L — ABNORMAL LOW (ref 3.5–5.1)
SODIUM: 139 mmol/L (ref 135–145)

## 2018-09-08 LAB — CBC
HEMATOCRIT: 36.7 % (ref 35.0–47.0)
Hemoglobin: 12 g/dL (ref 12.0–16.0)
MCH: 25.7 pg — ABNORMAL LOW (ref 26.0–34.0)
MCHC: 32.8 g/dL (ref 32.0–36.0)
MCV: 78.4 fL — ABNORMAL LOW (ref 80.0–100.0)
Platelets: 344 10*3/uL (ref 150–440)
RBC: 4.68 MIL/uL (ref 3.80–5.20)
RDW: 15.1 % — AB (ref 11.5–14.5)
WBC: 7.9 10*3/uL (ref 3.6–11.0)

## 2018-09-08 MED ORDER — SUMATRIPTAN SUCCINATE 50 MG PO TABS: 50.0000 mg | ORAL_TABLET | Freq: Once | ORAL | 0 refills | Status: DC | PRN

## 2018-09-08 MED ORDER — BELLADONNA ALKALOIDS-OPIUM 16.2-60 MG RE SUPP
1.0000 | Freq: Once | RECTAL | Status: AC
  Administered 2018-09-08: 1 via RECTAL
  Filled 2018-09-08: qty 1

## 2018-09-08 MED ORDER — DEXAMETHASONE SODIUM PHOSPHATE 10 MG/ML IJ SOLN
10.0000 mg | Freq: Once | INTRAMUSCULAR | Status: AC
  Administered 2018-09-08: 10 mg via INTRAVENOUS
  Filled 2018-09-08: qty 1

## 2018-09-08 MED ORDER — PROCHLORPERAZINE EDISYLATE 10 MG/2ML IJ SOLN
10.0000 mg | Freq: Once | INTRAMUSCULAR | Status: AC
  Administered 2018-09-08: 10 mg via INTRAVENOUS
  Filled 2018-09-08: qty 2

## 2018-09-08 MED ORDER — DIPHENHYDRAMINE HCL 50 MG/ML IJ SOLN
25.0000 mg | Freq: Once | INTRAMUSCULAR | Status: AC
  Administered 2018-09-08: 25 mg via INTRAVENOUS
  Filled 2018-09-08: qty 1

## 2018-09-08 MED ORDER — SODIUM CHLORIDE 0.9 % IV BOLUS
1000.0000 mL | Freq: Once | INTRAVENOUS | Status: AC
  Administered 2018-09-08: 1000 mL via INTRAVENOUS

## 2018-09-08 MED ORDER — KETOROLAC TROMETHAMINE 30 MG/ML IJ SOLN
15.0000 mg | Freq: Once | INTRAMUSCULAR | Status: AC
  Administered 2018-09-08: 15 mg via INTRAVENOUS
  Filled 2018-09-08: qty 1

## 2018-09-08 NOTE — ED Triage Notes (Addendum)
Pt c/o pressure like headache to the top of her head with photophobia and  nausea. Denies vision changes or other sx at present. Pt states she had a TIA at the first of the month with a HA.

## 2018-09-08 NOTE — ED Notes (Signed)
Pt c/o not being able to urinate since last night and takes 40mg  lasix daily. Bladder scan shows 418. EDP notified, no new orders at this time. Continue to monitor the pt.

## 2018-09-08 NOTE — Discharge Instructions (Addendum)
Please continue taking magnesium supplementation every single day to help prevent these headaches from happening in the future.  When you begin a migraine coming on its important to take your Imitrex right away as this medication works to abort headaches when they are coming up.  Follow-up with Dr. Manuella Ghazi your neurologist next month as scheduled and return to the emergency department for any concerns.  It was a pleasure to take care of you today, and thank you for coming to our emergency department.  If you have any questions or concerns before leaving please ask the nurse to grab me and I'm more than happy to go through your aftercare instructions again.  If you were prescribed any opioid pain medication today such as Norco, Vicodin, Percocet, morphine, hydrocodone, or oxycodone please make sure you do not drive when you are taking this medication as it can alter your ability to drive safely.  If you have any concerns once you are home that you are not improving or are in fact getting worse before you can make it to your follow-up appointment, please do not hesitate to call 911 and come back for further evaluation.  Darel Hong, MD  Results for orders placed or performed during the hospital encounter of 60/10/93  Basic metabolic panel  Result Value Ref Range   Sodium 139 135 - 145 mmol/L   Potassium 3.2 (L) 3.5 - 5.1 mmol/L   Chloride 98 98 - 111 mmol/L   CO2 28 22 - 32 mmol/L   Glucose, Bld 125 (H) 70 - 99 mg/dL   BUN 21 8 - 23 mg/dL   Creatinine, Ser 1.23 (H) 0.44 - 1.00 mg/dL   Calcium 9.0 8.9 - 10.3 mg/dL   GFR calc non Af Amer 45 (L) >60 mL/min   GFR calc Af Amer 52 (L) >60 mL/min   Anion gap 13 5 - 15  CBC  Result Value Ref Range   WBC 7.9 3.6 - 11.0 K/uL   RBC 4.68 3.80 - 5.20 MIL/uL   Hemoglobin 12.0 12.0 - 16.0 g/dL   HCT 36.7 35.0 - 47.0 %   MCV 78.4 (L) 80.0 - 100.0 fL   MCH 25.7 (L) 26.0 - 34.0 pg   MCHC 32.8 32.0 - 36.0 g/dL   RDW 15.1 (H) 11.5 - 14.5 %   Platelets  344 150 - 440 K/uL   Dg Chest 2 View  Result Date: 08/22/2018 CLINICAL DATA:  LEFT side chest pain last night with nausea and vomiting. Shortness of breath. EXAM: CHEST - 2 VIEW COMPARISON:  Chest x-rays dated 05/05/2018 and 11/28/2017. Chest CT dated 05/05/2018. FINDINGS: Heart size and mediastinal contours are stable. Prominent epicardial fat pad, as demonstrated on recent chest CT. Chronic bronchitic changes. Additional coarse lung markings bilaterally, upper lobe predominant, suggesting some degree of chronic interstitial lung disease/fibrosis. No new lung abnormality. No confluent opacity to suggest a developing pneumonia. No pleural effusion or pneumothorax seen. No acute or suspicious osseous finding. IMPRESSION: 1. No active cardiopulmonary disease. No evidence of pneumonia or pulmonary edema. 2. 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 Electronically Signed   By: Franki Cabot M.D.   On: 08/22/2018 13:38   Ct Head Wo Contrast  Result Date: 08/22/2018 CLINICAL DATA:  Sudden onset left-sided numbness last night. EXAM: CT HEAD WITHOUT CONTRAST TECHNIQUE: Contiguous axial images were obtained from the base of the skull through the vertex without intravenous contrast. COMPARISON:  Multiple exams, including 10/04/2017, and MRI from 10/05/2017 FINDINGS:  Brain: The brainstem, cerebellum, cerebral peduncles, thalami, basal ganglia, basilar cisterns, and ventricular system appear within normal limits. No intracranial hemorrhage, mass lesion, or acute CVA. Vascular: Unremarkable Skull: Unremarkable Sinuses/Orbits: Unremarkable Other: No supplemental non-categorized findings. IMPRESSION: 1.  No significant abnormality identified. Electronically Signed   By: Van Clines M.D.   On: 08/22/2018 15:20   Ct Angio Chest Pe W And/or Wo Contrast  Result Date: 08/22/2018 CLINICAL DATA:  Sudden onset left-sided numbness and left chest pain. EXAM: CT ANGIOGRAPHY CHEST WITH  CONTRAST TECHNIQUE: Multidetector CT imaging of the chest was performed using the standard protocol during bolus administration of intravenous contrast. Multiplanar CT image reconstructions and MIPs were obtained to evaluate the vascular anatomy. CONTRAST:  25mL OMNIPAQUE IOHEXOL 350 MG/ML SOLN COMPARISON:  CT chest 05/05/2017 and chest radiograph from 08/22/2018 FINDINGS: Cardiovascular: No filling defect is identified in the pulmonary arterial tree to suggest pulmonary embolus. Coronary, aortic arch, and branch vessel atherosclerotic vascular disease. 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 Borderline cardiomegaly. Mediastinum/Nodes: Slightly asymmetric glandular tissues in the breasts, greater in the right upper breast, but without a discrete focal lesion observed. Small supraclavicular and mediastinal lymph nodes are not pathologically enlarged. Lungs/Pleura: Biapical pleuroparenchymal scarring. Mild ground-glass opacity and bandlike density anteriorly in the right upper lobe for example on image 35/6, new compared to the prior exam of 05/05/2018. Mild airway thickening. Mild scarring anteromedially in the left upper lobe, unchanged, with associated volume loss. There is also some scarring inferiorly in the lingula, likewise stable. A dense calcified granuloma in the left lower lobe adjacent to the diaphragm is stable on image 67/6. No pleural effusion. Upper Abdomen: Unremarkable Musculoskeletal: Old healed left rib fractures. Old healed upper sternal body fracture. Review of the MIP images confirms the above findings. IMPRESSION: 1. No filling defect is identified in the pulmonary arterial tree to suggest pulmonary embolus. 2. Stable appearance of 4.3 cm ascending aortic aneurysm. Recommend annual imaging followup by CTA or MRA. This recommendation follows 2010 ACCF/AHA/AATS/ACR/ASA/SCA/SCAI/SIR/STS/SVM Guidelines for the Diagnosis and  Management of Patients with Thoracic Aortic Disease. Circulation. 2010; 121: F681-E751 3. Mild airway thickening with some ground-glass and bandlike opacities anteriorly in the right upper lobe which are new from 05/05/2018, and may represent a small region of atypical infection. 4. Aortic Atherosclerosis (ICD10-I70.0). Coronary atherosclerosis with borderline cardiomegaly. Electronically Signed   By: Van Clines M.D.   On: 08/22/2018 15:18   Mr Jodene Nam Head Wo Contrast  Result Date: 08/23/2018 CLINICAL DATA:  Neuro deficits, subacute. Heaviness of the left upper and lower extremity upon waking today. Personal history of CVA. Headaches over the last several days. Numbness in the left upper and lower extremities. Strength is preserved. EXAM: MRI HEAD WITHOUT CONTRAST MRA HEAD WITHOUT CONTRAST TECHNIQUE: Multiplanar, multiecho pulse sequences of the brain and surrounding structures were obtained without intravenous contrast. Angiographic images of the head were obtained using MRA technique without contrast. COMPARISON:  CT head without contrast 08/22/2018. MRI brain 10/05/2017 FINDINGS: MRI HEAD FINDINGS Brain: The diffusion-weighted images demonstrate no acute or subacute infarction. Scattered periventricular and subcortical T2 hyperintensities are mildly advanced for age. No acute hemorrhage or mass lesion is present. The ventricles are of normal size. No significant extra-axial fluid collection is present. The brainstem and cerebellum are normal. Vascular: Flow is present in the major intracranial arteries. Skull and upper cervical spine: The skull base is within normal limits. The craniocervical junction is normal. Upper cervical spine is  within normal limits. Sinuses/Orbits: The paranasal sinuses and mastoid air cells are clear. Globes and orbits are within normal limits. MRA HEAD FINDINGS Internal carotid arteries are within normal limits from the high cervical segments through the ICA termini. The A1 and  M1 segments are normal. No definite anterior communicating artery is patent. There is noted bifurcation of the right MCA, a normal variant. The ACA and MCA branch vessels are within normal limits. The vertebral arteries are codominant. PICA origin is visualized and within normal limits bilaterally. The basilar artery is normal. Both posterior cerebral arteries originate from the basilar tip. PCA branch vessels are within normal limits. IMPRESSION: 1. No acute intracranial abnormality. 2. Scattered periventricular and subcortical T2 hyperintensities bilaterally are mildly advanced for age. The finding is nonspecific but can be seen in the setting of chronic microvascular ischemia, a demyelinating process such as multiple sclerosis, vasculitis, complicated migraine headaches, or as the sequelae of a prior infectious or inflammatory process. 3. Normal MRA circle-of-Willis without significant proximal stenosis, aneurysm, or branch vessel occlusion. Electronically Signed   By: San Morelle M.D.   On: 08/23/2018 12:38   Mr Brain Wo Contrast  Result Date: 08/23/2018 CLINICAL DATA:  Neuro deficits, subacute. Heaviness of the left upper and lower extremity upon waking today. Personal history of CVA. Headaches over the last several days. Numbness in the left upper and lower extremities. Strength is preserved. EXAM: MRI HEAD WITHOUT CONTRAST MRA HEAD WITHOUT CONTRAST TECHNIQUE: Multiplanar, multiecho pulse sequences of the brain and surrounding structures were obtained without intravenous contrast. Angiographic images of the head were obtained using MRA technique without contrast. COMPARISON:  CT head without contrast 08/22/2018. MRI brain 10/05/2017 FINDINGS: MRI HEAD FINDINGS Brain: The diffusion-weighted images demonstrate no acute or subacute infarction. Scattered periventricular and subcortical T2 hyperintensities are mildly advanced for age. No acute hemorrhage or mass lesion is present. The ventricles are of  normal size. No significant extra-axial fluid collection is present. The brainstem and cerebellum are normal. Vascular: Flow is present in the major intracranial arteries. Skull and upper cervical spine: The skull base is within normal limits. The craniocervical junction is normal. Upper cervical spine is within normal limits. Sinuses/Orbits: The paranasal sinuses and mastoid air cells are clear. Globes and orbits are within normal limits. MRA HEAD FINDINGS Internal carotid arteries are within normal limits from the high cervical segments through the ICA termini. The A1 and M1 segments are normal. No definite anterior communicating artery is patent. There is noted bifurcation of the right MCA, a normal variant. The ACA and MCA branch vessels are within normal limits. The vertebral arteries are codominant. PICA origin is visualized and within normal limits bilaterally. The basilar artery is normal. Both posterior cerebral arteries originate from the basilar tip. PCA branch vessels are within normal limits. IMPRESSION: 1. No acute intracranial abnormality. 2. Scattered periventricular and subcortical T2 hyperintensities bilaterally are mildly advanced for age. The finding is nonspecific but can be seen in the setting of chronic microvascular ischemia, a demyelinating process such as multiple sclerosis, vasculitis, complicated migraine headaches, or as the sequelae of a prior infectious or inflammatory process. 3. Normal MRA circle-of-Willis without significant proximal stenosis, aneurysm, or branch vessel occlusion. Electronically Signed   By: San Morelle M.D.   On: 08/23/2018 12:38   US Carotid Bilateral (at [facility] Only)  Result Date: 08/23/2018 CLINICAL DATA:  Stroke. EXAM: BILATERAL CAROTID DUPLEX ULTRASOUND TECHNIQUE: Pearline Cables scale imaging, color Doppler and duplex ultrasound were performed of bilateral carotid  and vertebral arteries in the neck. COMPARISON:  10/05/2017 FINDINGS: Criteria:  Quantification of carotid stenosis is based on velocity parameters that correlate the residual internal carotid diameter with NASCET-based stenosis levels, using the diameter of the distal internal carotid lumen as the denominator for stenosis measurement. The following velocity measurements were obtained: RIGHT ICA: 144/46 cm/sec CCA: 46/27 cm/sec SYSTOLIC ICA/CCA RATIO:  1.9 ECA: 70 cm/sec LEFT ICA: 128/45 cm/sec CCA: 03/50 cm/sec SYSTOLIC ICA/CCA RATIO:  1.6 ECA: 111 cm/sec RIGHT CAROTID ARTERY: Eccentric partially calcified plaque at the carotid bifurcation extending into the ICA resulting in at least mild stenosis. Mildly elevated peak systolic velocities at the level of stenosis. Otherwise normal waveforms and color Doppler signal. RIGHT VERTEBRAL ARTERY:  Normal flow direction and waveform. LEFT CAROTID ARTERY: Eccentric partially calcified plaque in the bulb. Normal waveforms and color Doppler signal. Mildly tortuous ICA. LEFT VERTEBRAL ARTERY:  Normal flow direction and waveform. IMPRESSION: 1. 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 2.  Antegrade bilateral vertebral arterial flow. Electronically Signed   By: Lucrezia Europe M.D.   On: 08/23/2018 09:15

## 2018-09-08 NOTE — ED Provider Notes (Signed)
[facility] Emergency Department Provider Note  ____________________________________________   None    (approximate)  I have reviewed the triage vital signs and the nursing notes.   HISTORY  Chief Complaint Headache   HPI Morgan [name] is a 67 y.o. female presents to the emergency department with gradual onset not maximal onset right frontal throbbing headache that began last night and is identical to previous headaches she has had for the past month.  Associated with photophobia and nausea.  She is has some tingling and numbness on her left face and left arm.  She is concerned because she is had a stroke in the past and is worried that this could represent a stroke.  She takes no blood thinning medication.  No fevers or chills.  No chest pain or shortness of breath.  Nothing seems to make the pain better.    Past Medical History:  Diagnosis Date  . Allergy   . Anemia   . Anxiety   . Arthritis   . Bipolar 1 disorder ([place])   . Depression   . GERD (gastroesophageal reflux disease)   . Hyperlipidemia   . Hypertension   . MVP (mitral valve prolapse)     Patient Active Problem List   Diagnosis Date Noted  . CVA (cerebral vascular accident) ([place]) 08/22/2018  . Right sided weakness 10/04/2017  . HLD (hyperlipidemia) 10/04/2017  . GERD (gastroesophageal reflux disease) 10/04/2017  . Acute respiratory failure ([place])   . Anxiety 08/26/2017  . Sepsis ([place]) 08/26/2017  . HCAP (healthcare-associated pneumonia) 08/26/2017  . Essential hypertension   . Depression   . CAP (community acquired pneumonia) 02/22/2015  . Cough 02/22/2015  . Diarrhea 02/22/2015  . Hypokalemia 02/22/2015  . Tobacco abuse 02/22/2015  . Mass of breast, right 11/18/2013  . Chronic lower back pain 04/29/2013  . Paresthesia 04/29/2013    Past Surgical History:  Procedure Laterality Date  . ABDOMINAL HYSTERECTOMY    . APPENDECTOMY    . BREAST SURGERY Right 1988   tumor removal   . KNEE SURGERY  2005   2 times left  . TUBAL LIGATION      Prior to Admission medications   Medication Sig Start Date End Date Taking? Authorizing Provider  acetaminophen (TYLENOL) 500 MG tablet Take 1,000 mg by mouth every 6 (six) hours as needed for moderate pain (pain).     [provider]  albuterol (PROVENTIL HFA;VENTOLIN HFA) 108 (90 BASE) MCG/ACT inhaler Inhale 2 puffs into the lungs every 4 (four) hours as needed for wheezing or shortness of breath. 02/24/15   Ghimire, Henreitta Leber, MD  amLODipine (NORVASC) 5 MG tablet Take 5 mg by mouth daily.     [provider]  aspirin EC 325 MG EC tablet Take 1 tablet (325 mg total) by mouth daily. 10/07/17   Dustin Flock, MD  atorvastatin (LIPITOR) 40 MG tablet Take 40 mg by mouth at bedtime.    [provider]  carvedilol (COREG) 3.125 MG tablet Take 3.125 mg by mouth 2 (two) times daily with a meal.    [provider]  furosemide (LASIX) 40 MG tablet Take 40 mg by mouth.    [provider]  gabapentin (NEURONTIN) 300 MG capsule Take 1 capsule (300 mg total) by mouth 3 (three) times daily as needed. Patient taking differently: Take 300 mg by mouth at bedtime.  09/12/17   Hillary Bow, MD  mirtazapine (REMERON) 45 MG tablet Take 45 mg by mouth at  bedtime.     [provider]  spironolactone (ALDACTONE) 25 MG tablet Take 25 mg by mouth daily.    [provider]  SUMAtriptan (IMITREX) 50 MG tablet Take 1 tablet (50 mg total) by mouth once as needed for migraine. May repeat in 2 hours if headache persists or recurs. 09/08/18 09/09/19  Darel Hong, MD    Allergies Demerol [meperidine] and Strawberry (diagnostic)  Family History  Problem Relation Age of Onset  . Breast cancer Mother   . Ovarian cancer Mother   . Hypertension Father   . Hyperlipidemia Father   . Heart disease Father   . Breast cancer Maternal Grandmother   . Cancer Paternal Grandmother   . Diabetes Paternal  Grandmother   . Cancer Paternal Grandfather   . Breast cancer Maternal Aunt   . Breast cancer Maternal Aunt     Social History Social History   Tobacco Use  . Smoking status: Former Smoker    Types: Cigarettes  . Smokeless tobacco: Never Used  Substance Use Topics  . Alcohol use: Yes  . Drug use: No    Review of Systems Constitutional: No fever/chills Eyes: Negative for visual changes ENT: No sore throat. Cardiovascular: Denies chest pain. Respiratory: Denies shortness of breath. Gastrointestinal: No abdominal pain.  Positive for nausea, no vomiting.  No diarrhea.  No constipation. Genitourinary: Negative for dysuria. Musculoskeletal: Negative for back pain. Skin: Negative for rash. Neurological: Positive for headache   ____________________________________________   PHYSICAL EXAM:  VITAL SIGNS: ED Triage Vitals  Enc Vitals Group     BP 09/08/18 1018 (!) 144/97     Pulse Rate 09/08/18 1018 67     Resp 09/08/18 1018 17     Temp 09/08/18 1018 98.1 F (36.7 C)     Temp Source 09/08/18 1018 Oral     SpO2 09/08/18 1018 98 %     Weight 09/08/18 1019 222 lb (100.7 kg)     Height 09/08/18 1019 6\' 1"  (1.854 m)     Head Circumference --      Peak Flow --      Pain Score 09/08/18 1018 8     Pain Loc --      Pain Edu? --      Excl. in [place]? --     Constitutional: Alert and oriented x4 appears extremely uncomfortable wearing sunglasses in a darkened room Eyes: PERRL EOMI. midrange and brisk.  No nystagmus Head: Atraumatic. Nose: No congestion/rhinnorhea. Mouth/Throat: No trismus Neck: No stridor.  No meningismus Cardiovascular: Normal rate, regular rhythm. Grossly normal heart sounds.  Good peripheral circulation. Respiratory: Normal respiratory effort.  No retractions. Lungs CTAB and moving good air Gastrointestinal: Soft nontender Musculoskeletal: No lower extremity edema   Neurologic:  Normal speech and language.  Mild left upper extremity and left lower extremity  weakness with some numbness Skin:  Skin is warm, dry and intact. No rash noted. Psychiatric: Mood and affect are normal. Speech and behavior are normal.    ____________________________________________   DIFFERENTIAL includes but not limited to  Complex migraine, stroke, intracerebral hemorrhage, meningitis ____________________________________________   LABS (all labs ordered are listed, but only abnormal results are displayed)  Labs Reviewed  BASIC METABOLIC PANEL - Abnormal; Notable for the following components:      Result Value   Potassium 3.2 (*)    Glucose, Bld 125 (*)    Creatinine, Ser 1.23 (*)    GFR calc non Af Amer 45 (*)    GFR calc  Af Amer 52 (*)    All other components within normal limits  CBC - Abnormal; Notable for the following components:   MCV 78.4 (*)    MCH 25.7 (*)    RDW 15.1 (*)    All other components within normal limits  URINALYSIS, COMPLETE (UACMP) WITH MICROSCOPIC    Lab work reviewed by me with chronic kidney disease but no acute disease __________________________________________  EKG  ED ECG REPORT I, Darel Hong, the attending physician, personally viewed and interpreted this ECG.  Date: 09/08/2018 EKG Time:  Rate: 65 Rhythm: normal sinus rhythm QRS Axis: Leftward axis Intervals: normal ST/T Wave abnormalities: Nonspecific lateral T wave abnormalities Narrative Interpretation: no evidence of acute ischemia  ____________________________________________  RADIOLOGY  MRI performed on previous hospitalization reviewed by me showed no stroke ____________________________________________   PROCEDURES  Procedure(s) performed: no  Procedures  Critical Care performed: no  ____________________________________________   INITIAL IMPRESSION / ASSESSMENT AND PLAN / ED COURSE  Pertinent labs & imaging results that were available during my care of the patient were reviewed by me and considered in my medical decision making (see  chart for details).   As part of my medical decision making, I reviewed the following data within the [place] History obtained from family if available, nursing notes, old chart and ekg, as well as notes from prior ED visits.  Patient comes to the emergency department with a gradual onset not maximal onset right frontal and parietal headache that is identical to previous headaches associated with nausea photophobia and some left-sided numbness and weakness.  This is consistent with a complex migraine.  On her previous hospitalization 2 weeks ago she had an MRI showing no acute infarct and after her migraine was treated her neuro exam normalized.  I will give her a trial of 10 mg of IV Compazine, 25 mg of IV Benadryl, 15 mg of IV Toradol, and gentle IV hydration and reevaluate.  No indication for advanced imaging at this time.     ----------------------------------------- 1:50 PM on 09/08/2018 -----------------------------------------  The patient's headache is now completely resolved.  I appreciate that she has some mild urinary retention however she has normal renal function.  We will give her a Ladonna and opium suppository today and refer her back to her neurologist next month.  She currently has no medications for migraines and I will begin her on sumatriptan.  Strict return precautions have been given. ____________________________________________   FINAL CLINICAL IMPRESSION(S) / ED DIAGNOSES  Final diagnoses:  Migraine with aura and without status migrainosus, not intractable      NEW MEDICATIONS STARTED DURING THIS VISIT:  New Prescriptions   SUMATRIPTAN (IMITREX) 50 MG TABLET    Take 1 tablet (50 mg total) by mouth once as needed for migraine. May repeat in 2 hours if headache persists or recurs.     Note:  This document was prepared using Dragon voice recognition software and may include unintentional dictation errors.     Darel Hong, MD 09/08/18  1407

## 2018-09-18 ENCOUNTER — Ambulatory Visit: Payer: Medicare HMO

## 2018-10-16 ENCOUNTER — Emergency Department
Admission: EM | Admit: 2018-10-16 | Discharge: 2018-10-16 | Disposition: A | Discharge status: Home / Self Care | Payer: Medicare HMO | Attending: Emergency Medicine | Admitting: Emergency Medicine

## 2018-10-16 ENCOUNTER — Emergency Department: Payer: Medicare HMO

## 2018-10-16 ENCOUNTER — Encounter: Payer: Self-pay | Admitting: Emergency Medicine

## 2018-10-16 ENCOUNTER — Other Ambulatory Visit: Payer: Self-pay

## 2018-10-16 DIAGNOSIS — I10 Essential (primary) hypertension: Secondary | ICD-10-CM | POA: Diagnosis not present

## 2018-10-16 DIAGNOSIS — Z79899 Other long term (current) drug therapy: Secondary | ICD-10-CM | POA: Diagnosis not present

## 2018-10-16 DIAGNOSIS — Z862 Personal history of diseases of the blood and blood-forming organs and certain disorders involving the immune mechanism: Secondary | ICD-10-CM | POA: Insufficient documentation

## 2018-10-16 DIAGNOSIS — R202 Paresthesia of skin: Secondary | ICD-10-CM | POA: Insufficient documentation

## 2018-10-16 DIAGNOSIS — F419 Anxiety disorder, unspecified: Secondary | ICD-10-CM | POA: Insufficient documentation

## 2018-10-16 DIAGNOSIS — Z8673 Personal history of transient ischemic attack (TIA), and cerebral infarction without residual deficits: Secondary | ICD-10-CM | POA: Insufficient documentation

## 2018-10-16 DIAGNOSIS — R220 Localized swelling, mass and lump, head: Secondary | ICD-10-CM | POA: Insufficient documentation

## 2018-10-16 DIAGNOSIS — F319 Bipolar disorder, unspecified: Secondary | ICD-10-CM | POA: Diagnosis not present

## 2018-10-16 DIAGNOSIS — Z7982 Long term (current) use of aspirin: Secondary | ICD-10-CM | POA: Diagnosis not present

## 2018-10-16 DIAGNOSIS — Z87891 Personal history of nicotine dependence: Secondary | ICD-10-CM | POA: Insufficient documentation

## 2018-10-16 LAB — COMPREHENSIVE METABOLIC PANEL
ALT: 9 U/L (ref 0–44)
AST: 15 U/L (ref 15–41)
Albumin: 3.7 g/dL (ref 3.5–5.0)
Alkaline Phosphatase: 155 U/L — ABNORMAL HIGH (ref 38–126)
Anion gap: 9 (ref 5–15)
BILIRUBIN TOTAL: 0.5 mg/dL (ref 0.3–1.2)
BUN: 20 mg/dL (ref 8–23)
CALCIUM: 8.9 mg/dL (ref 8.9–10.3)
CO2: 29 mmol/L (ref 22–32)
CREATININE: 1.22 mg/dL — AB (ref 0.44–1.00)
Chloride: 102 mmol/L (ref 98–111)
GFR, EST AFRICAN AMERICAN: 53 mL/min — AB (ref >60)
GFR, EST NON AFRICAN AMERICAN: 45 mL/min — AB (ref >60)
Glucose, Bld: 109 mg/dL — ABNORMAL HIGH (ref 70–99)
Potassium: 3.4 mmol/L — ABNORMAL LOW (ref 3.5–5.1)
Sodium: 140 mmol/L (ref 135–145)
TOTAL PROTEIN: 7.5 g/dL (ref 6.5–8.1)

## 2018-10-16 LAB — DIFFERENTIAL
ABS IMMATURE GRANULOCYTES: 0.03 10*3/uL (ref 0.00–0.07)
Basophils Absolute: 0 10*3/uL (ref 0.0–0.1)
Basophils Relative: 0 %
Eosinophils Absolute: 0.2 10*3/uL (ref 0.0–0.5)
Eosinophils Relative: 3 %
Immature Granulocytes: 0 %
LYMPHS ABS: 2.3 10*3/uL (ref 0.7–4.0)
Lymphocytes Relative: 29 %
MONO ABS: 0.5 10*3/uL (ref 0.1–1.0)
MONOS PCT: 6 %
NEUTROS ABS: 4.9 10*3/uL (ref 1.7–7.7)
NEUTROS PCT: 62 %

## 2018-10-16 LAB — CBC
HCT: 37 % (ref 36.0–46.0)
Hemoglobin: 11.7 g/dL — ABNORMAL LOW (ref 12.0–15.0)
MCH: 25.4 pg — AB (ref 26.0–34.0)
MCHC: 31.6 g/dL (ref 30.0–36.0)
MCV: 80.3 fL (ref 80.0–100.0)
Platelets: 310 10*3/uL (ref 150–400)
RBC: 4.61 MIL/uL (ref 3.87–5.11)
RDW: 14.8 % (ref 11.5–15.5)
WBC: 7.9 10*3/uL (ref 4.0–10.5)
nRBC: 0 % (ref 0.0–0.2)

## 2018-10-16 LAB — TROPONIN I

## 2018-10-16 LAB — APTT: aPTT: 31 seconds (ref 24–36)

## 2018-10-16 LAB — GLUCOSE, CAPILLARY: GLUCOSE-CAPILLARY: 111 mg/dL — AB (ref 70–99)

## 2018-10-16 LAB — PROTIME-INR
INR: 0.96
Prothrombin Time: 12.7 seconds (ref 11.4–15.2)

## 2018-10-16 MED ORDER — IOHEXOL 300 MG/ML  SOLN
75.0000 mL | Freq: Once | INTRAMUSCULAR | Status: AC | PRN
  Administered 2018-10-16: 60 mL via INTRAVENOUS

## 2018-10-16 NOTE — ED Provider Notes (Signed)
[facility] Emergency Department Provider Note   ____________________________________________    I have reviewed the triage vital signs and the nursing notes.   HISTORY  Chief Complaint Facial Swelling and Numbness     HPI Morgan [name] is a 67 y.o. female who presents with complaints of left-sided jaw swelling.  She also reports yesterday she developed tingling in her left arm and left leg.  Patient reports history of CVA which is left her with weakness in the left arm and left leg.  She states yesterday early morning she developed some mild tingling in both the arm and leg.  Additionally yesterday she developed some swelling in the left jaw which is mildly uncomfortable.  Denies fevers or chills or viral symptoms.  No headache.  No new weakness.  Review of medical records demonstrates patient had an MRI in September which was negative for CVA   Past Medical History:  Diagnosis Date  . Allergy   . Anemia   . Anxiety   . Arthritis   . Bipolar 1 disorder ([place])   . Depression   . GERD (gastroesophageal reflux disease)   . Hyperlipidemia   . Hypertension   . MVP (mitral valve prolapse)     Patient Active Problem List   Diagnosis Date Noted  . CVA (cerebral vascular accident) ([place]) 08/22/2018  . Right sided weakness 10/04/2017  . HLD (hyperlipidemia) 10/04/2017  . GERD (gastroesophageal reflux disease) 10/04/2017  . Acute respiratory failure ([place])   . Anxiety 08/26/2017  . Sepsis ([place]) 08/26/2017  . HCAP (healthcare-associated pneumonia) 08/26/2017  . Essential hypertension   . Depression   . CAP (community acquired pneumonia) 02/22/2015  . Cough 02/22/2015  . Diarrhea 02/22/2015  . Hypokalemia 02/22/2015  . Tobacco abuse 02/22/2015  . Mass of breast, right 11/18/2013  . Chronic lower back pain 04/29/2013  . Paresthesia 04/29/2013    Past Surgical History:  Procedure Laterality Date  . ABDOMINAL HYSTERECTOMY    . APPENDECTOMY    .  BREAST SURGERY Right 1988   tumor removal  . KNEE SURGERY  2005   2 times left  . TUBAL LIGATION      Prior to Admission medications   Medication Sig Start Date End Date Taking? Authorizing Provider  acetaminophen (TYLENOL) 500 MG tablet Take 1,000 mg by mouth every 6 (six) hours as needed for moderate pain (pain).     [provider]  albuterol (PROVENTIL HFA;VENTOLIN HFA) 108 (90 BASE) MCG/ACT inhaler Inhale 2 puffs into the lungs every 4 (four) hours as needed for wheezing or shortness of breath. 02/24/15   Ghimire, Henreitta Leber, MD  amLODipine (NORVASC) 5 MG tablet Take 5 mg by mouth daily.     [provider]  aspirin EC 325 MG EC tablet Take 1 tablet (325 mg total) by mouth daily. 10/07/17   Dustin Flock, MD  atorvastatin (LIPITOR) 40 MG tablet Take 40 mg by mouth at bedtime.    [provider]  carvedilol (COREG) 3.125 MG tablet Take 3.125 mg by mouth 2 (two) times daily with a meal.    [provider]  furosemide (LASIX) 40 MG tablet Take 40 mg by mouth.    [provider]  gabapentin (NEURONTIN) 300 MG capsule Take 1 capsule (300 mg total) by mouth 3 (three) times daily as needed. Patient taking differently: Take 300 mg by mouth at bedtime.  09/12/17   Hillary Bow, MD  mirtazapine (REMERON) 45 MG tablet Take 45  mg by mouth at bedtime.     [provider]  spironolactone (ALDACTONE) 25 MG tablet Take 25 mg by mouth daily.    [provider]  SUMAtriptan (IMITREX) 50 MG tablet Take 1 tablet (50 mg total) by mouth once as needed for migraine. May repeat in 2 hours if headache persists or recurs. 09/08/18 09/09/19  Darel Hong, MD     Allergies Demerol [meperidine] and Strawberry (diagnostic)  Family History  Problem Relation Age of Onset  . Breast cancer Mother   . Ovarian cancer Mother   . Hypertension Father   . Hyperlipidemia Father   . Heart disease Father   . Breast cancer Maternal Grandmother   . Cancer  Paternal Grandmother   . Diabetes Paternal Grandmother   . Cancer Paternal Grandfather   . Breast cancer Maternal Aunt   . Breast cancer Maternal Aunt     Social History Social History   Tobacco Use  . Smoking status: Former Smoker    Types: Cigarettes  . Smokeless tobacco: Never Used  Substance Use Topics  . Alcohol use: Yes  . Drug use: No    Review of Systems  Constitutional: No fever/chills Eyes: No visual changes.  ENT: As above, patient reports occasional difficulty swallowing Cardiovascular: Denies chest pain. Respiratory: Denies shortness of breath. Gastrointestinal: No abdominal pain.  No nausea, no vomiting.   Genitourinary: Negative for dysuria. Musculoskeletal: Negative for back pain. Skin: Negative for rash. Neurological: As above   ____________________________________________   PHYSICAL EXAM:  VITAL SIGNS: ED Triage Vitals  Enc Vitals Group     BP 10/16/18 1013 136/84     Pulse Rate 10/16/18 1013 70     Resp 10/16/18 1013 20     Temp 10/16/18 1013 98.3 F (36.8 C)     Temp Source 10/16/18 1013 Oral     SpO2 10/16/18 1013 97 %     Weight 10/16/18 1014 103.4 kg (228 lb)     Height 10/16/18 1014 1.854 m (6\' 1" )     Head Circumference --      Peak Flow --      Pain Score 10/16/18 1014 7     Pain Loc --      Pain Edu? --      Excl. in [place]? --     Constitutional: Alert and oriented. Eyes: Conjunctivae are normal.   Nose: No congestion/rhinnorhea. Mouth/Throat: Mucous membranes are moist.  Mild left jaw swelling, the patient has dentures, no intraoral swelling noted no abscess erythema or lesions noted.  Pharynx normal Neck: No significant swelling Cardiovascular: Normal rate, regular rhythm.  Good peripheral circulation.  Gastrointestinal: Soft and nontender. No distention.   Musculoskeletal:  Warm and well perfused Neurologic:  Normal speech and language.  No new focal neurological deficits on exam Skin:  Skin is warm, dry and intact. No  rash noted. Psychiatric: Mood and affect are normal. Speech and behavior are normal.  ____________________________________________   LABS (all labs ordered are listed, but only abnormal results are displayed)  Labs Reviewed  CBC - Abnormal; Notable for the following components:      Result Value   Hemoglobin 11.7 (*)    MCH 25.4 (*)    All other components within normal limits  COMPREHENSIVE METABOLIC PANEL - Abnormal; Notable for the following components:   Potassium 3.4 (*)    Glucose, Bld 109 (*)    Creatinine, Ser 1.22 (*)    Alkaline Phosphatase 155 (*)    GFR  calc non Af Amer 45 (*)    GFR calc Af Amer 53 (*)    All other components within normal limits  GLUCOSE, CAPILLARY - Abnormal; Notable for the following components:   Glucose-Capillary 111 (*)    All other components within normal limits  PROTIME-INR  APTT  DIFFERENTIAL  TROPONIN I  CBG MONITORING, ED   ____________________________________________  EKG  Rhythm: normal sinus rhythm QRS Axis: normal Intervals: normal ST/T Wave abnormalities: normal Narrative Interpretation: Nonspecific ST changes  ____________________________________________  RADIOLOGY  CT head unremarkable CT soft tissue neck unremarkable ____________________________________________   PROCEDURES  Procedure(s) performed: No  Procedures   Critical Care performed: No ____________________________________________   INITIAL IMPRESSION / ASSESSMENT AND PLAN / ED COURSE  Pertinent labs & imaging results that were available during my care of the patient were reviewed by me and considered in my medical decision making (see chart for details).  Patient presents with 2 separate complaints, mild left jaw swelling, not consistent with infection, no erythema, no fluctuance.  She has dentures.  She also describes dysphasia, and review of records this seems to be chronic however given jaw swelling and dysphasia obtain CT imaging which was  overall reassuring, no evidence of deep space infection.  Lab work is also unremarkable, white blood cell count 7.9.  Regarding her paresthesias which are greater than 24 hours old CT head is unremarkable.  No new weakness.  Review of records demonstrates an MRI in September when she developed the left-sided weakness which was negative for stroke.  No indication for admission at this time, will have patient follow-up with her neurologist.  I recommend that she ice the left side of her jaw, if any worsening see PCP immediately or return to the ED   ____________________________________________   FINAL CLINICAL IMPRESSION(S) / ED DIAGNOSES  Final diagnoses:  Facial swelling  Paresthesias        Note:  This document was prepared using Dragon voice recognition software and may include unintentional dictation errors.    Lavonia Drafts, MD 10/16/18 1322

## 2018-10-16 NOTE — ED Triage Notes (Signed)
Patient to ER for c/o numbness/tingling to left side (arm and leg). Able to feel left side, but sensation is diminished. Patient also reports headache and swelling to left face. Patient reports temp of 99.4 at home. Patient states all symptoms began yesterday. Denies dental pain (has dentures).

## 2018-11-01 IMAGING — MG DIGITAL SCREENING BILATERAL MAMMOGRAM WITH CAD
4 series · 4 of 4 positions shown · non-contrast
Comparison: Previous exam(s).

CLINICAL DATA: Screening.

EXAM:
DIGITAL SCREENING BILATERAL MAMMOGRAM WITH CAD

[R CC]
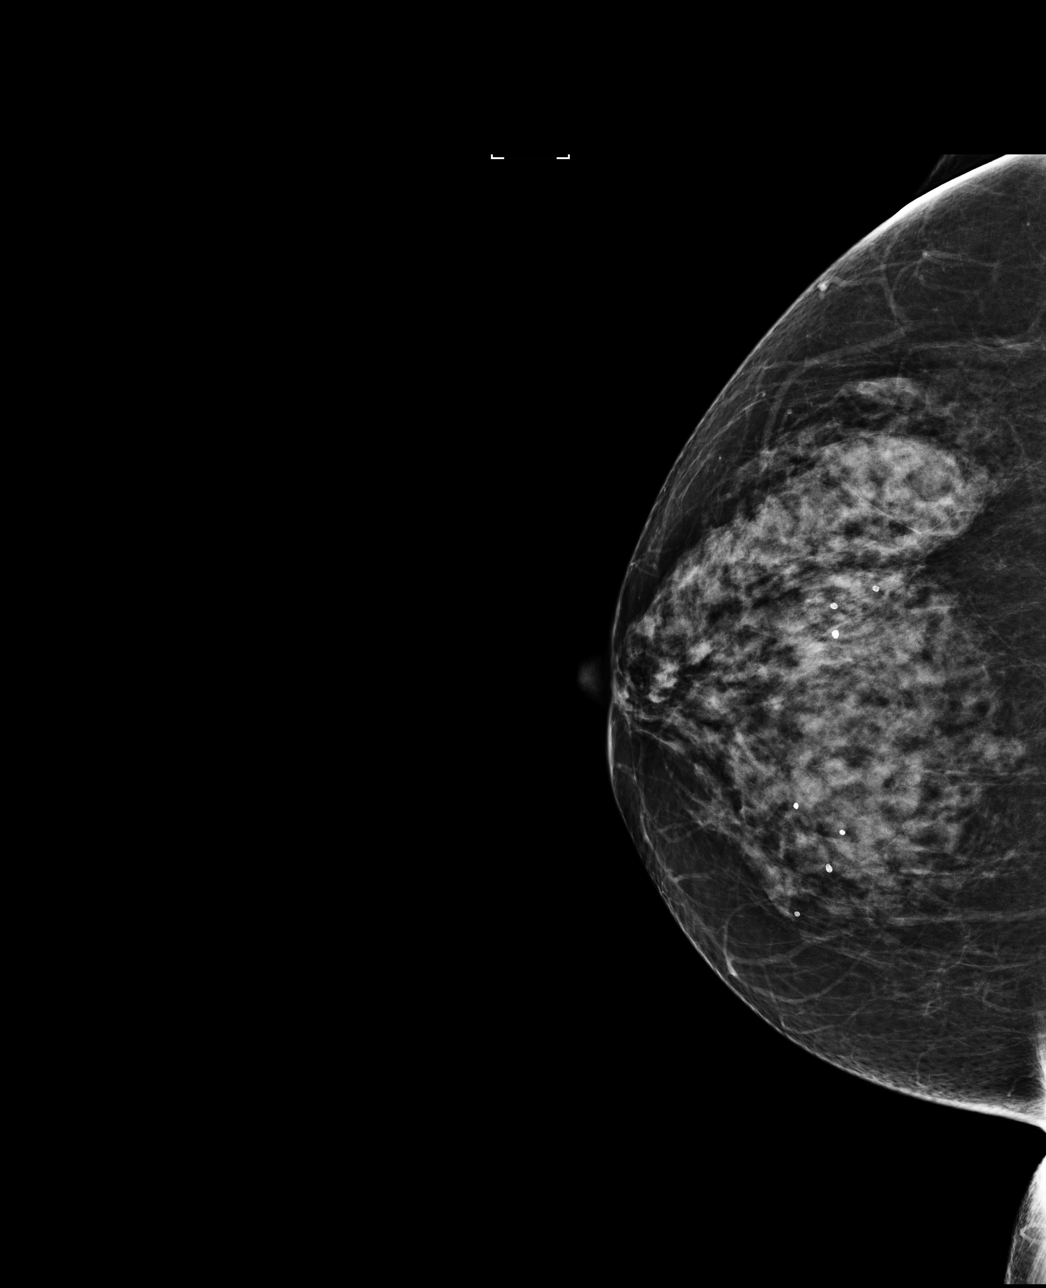

[L MLO]
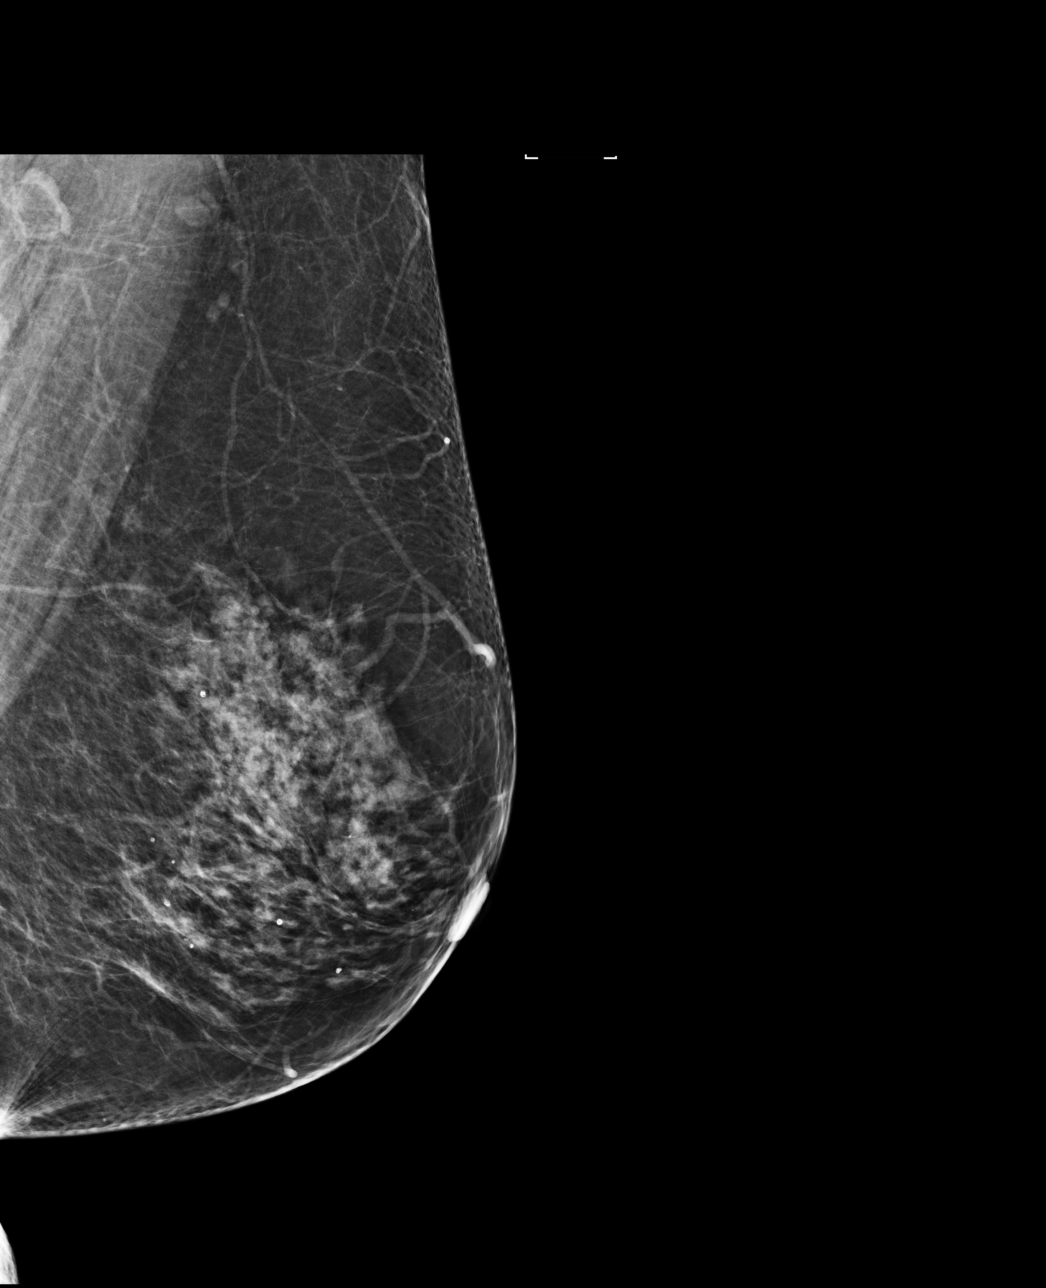

[L CC]
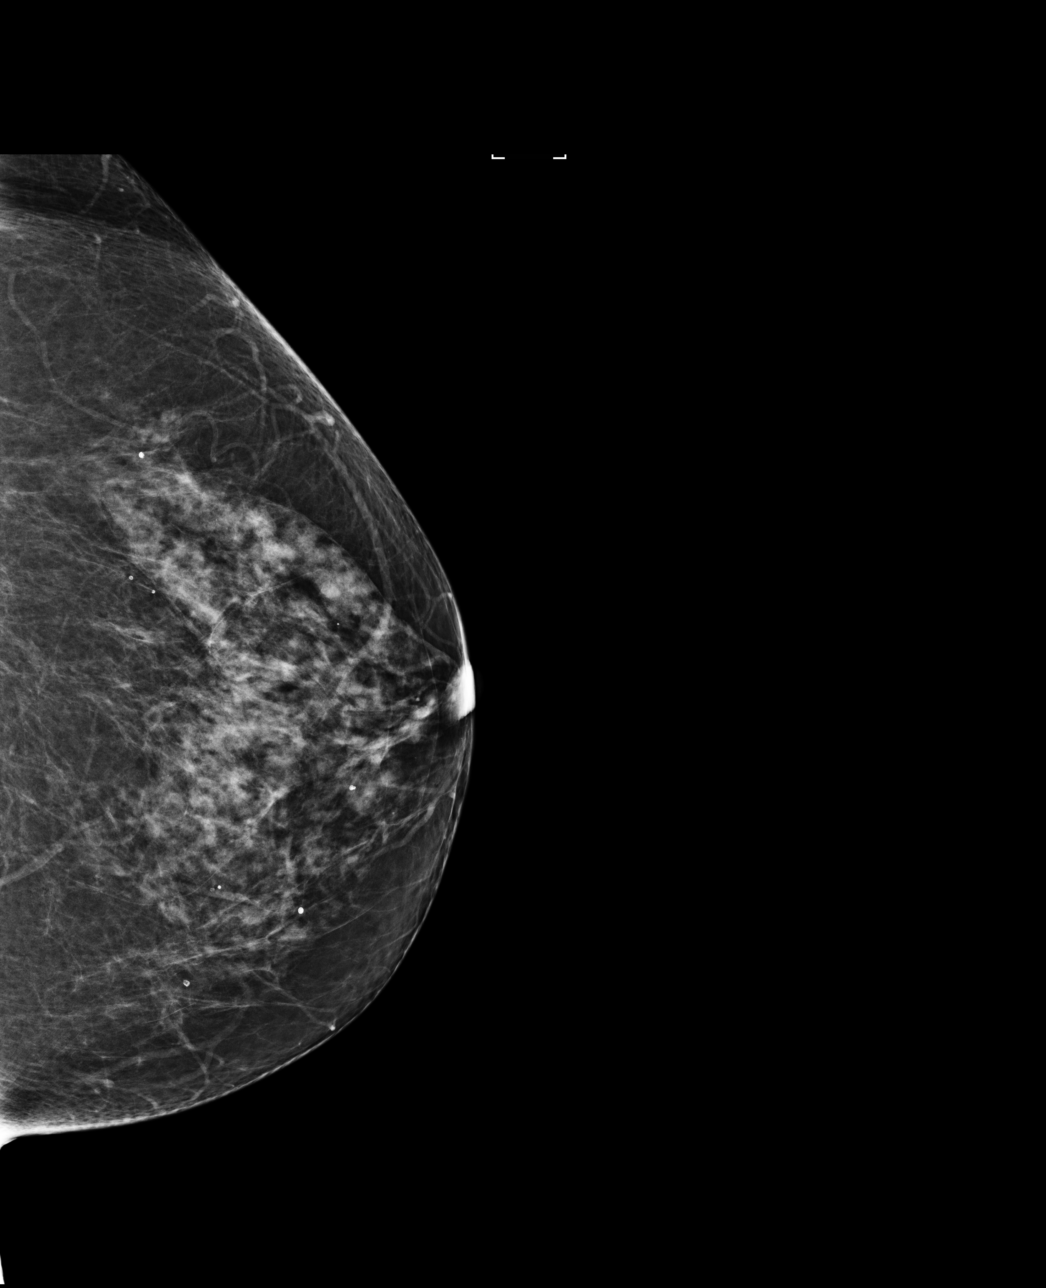

[R MLO]
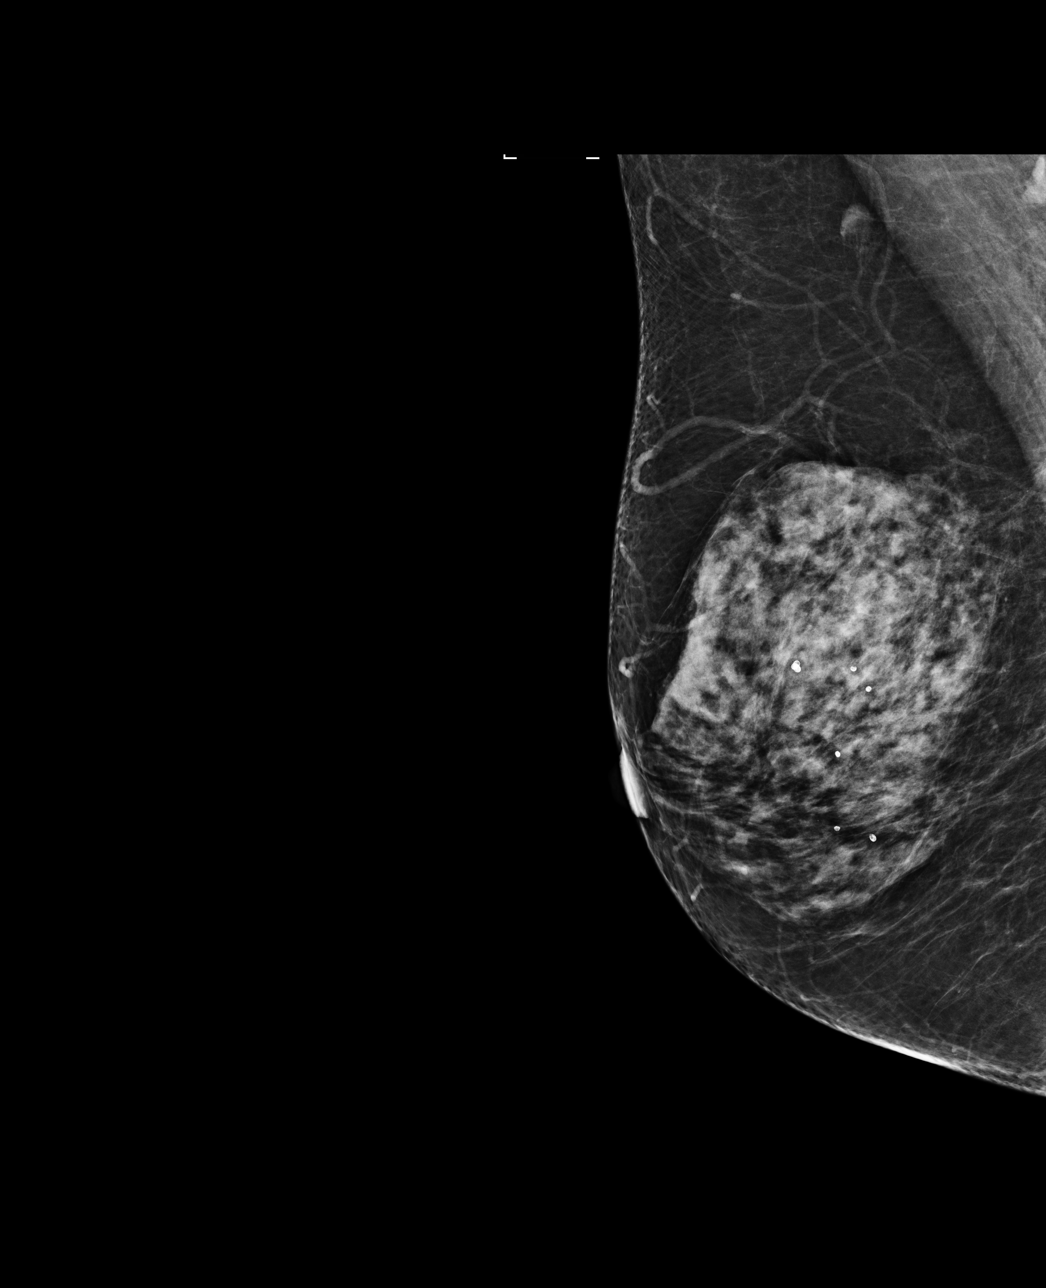

[4 of 4 positions shown; findings below may reference images not displayed]

ACR Breast Density Category c: The breast tissue is heterogeneously
dense, which may obscure small masses.
FINDINGS: There are no findings suspicious for malignancy. Images were
processed with CAD.
IMPRESSION: No mammographic evidence of malignancy. A result letter of this
screening mammogram will be mailed directly to the patient.

RECOMMENDATION:
Screening mammogram in one year. (Code:YJ-[id number])

BI-RADS CATEGORY  1: Negative.

## 2018-11-10 ENCOUNTER — Telehealth: Payer: Self-pay | Admitting: Radiology

## 2018-11-10 NOTE — Telephone Encounter (Signed)
Patient called stating that she has recently had a stroke and has been in the bed for 4 days no energy and not feeling well, states that her PCP is not seeing patients due to being so busy. I explained that I would call [place] @ [facility] to refer her. Called the office spoke with scheduling, explained situation and the office will contact patient to schedule appointment. Called patient to inform her that [place] @ [facility] will be contacting her to schedule appointment.

## 2018-11-11 ENCOUNTER — Encounter: Payer: Self-pay | Admitting: *Deleted

## 2018-11-12 ENCOUNTER — Encounter: Payer: Self-pay | Admitting: Family Medicine

## 2018-11-12 ENCOUNTER — Telehealth: Payer: Self-pay | Admitting: *Deleted

## 2018-11-12 ENCOUNTER — Ambulatory Visit (INDEPENDENT_AMBULATORY_CARE_PROVIDER_SITE_OTHER): Payer: Medicare HMO | Admitting: Family Medicine

## 2018-11-12 VITALS — BP 120/80 | HR 81 | Temp 98.0°F | Ht 70.5 in | Wt 225.0 lb

## 2018-11-12 DIAGNOSIS — R1310 Dysphagia, unspecified: Secondary | ICD-10-CM | POA: Insufficient documentation

## 2018-11-12 DIAGNOSIS — R1312 Dysphagia, oropharyngeal phase: Secondary | ICD-10-CM | POA: Insufficient documentation

## 2018-11-12 DIAGNOSIS — R400 Somnolence: Secondary | ICD-10-CM | POA: Diagnosis not present

## 2018-11-12 DIAGNOSIS — I10 Essential (primary) hypertension: Secondary | ICD-10-CM

## 2018-11-12 DIAGNOSIS — R490 Dysphonia: Secondary | ICD-10-CM | POA: Diagnosis not present

## 2018-11-12 DIAGNOSIS — Z23 Encounter for immunization: Secondary | ICD-10-CM

## 2018-11-12 DIAGNOSIS — E876 Hypokalemia: Secondary | ICD-10-CM

## 2018-11-12 LAB — CBC WITH DIFFERENTIAL/PLATELET
BASOS PCT: 0.4 % (ref 0.0–3.0)
Basophils Absolute: 0 10*3/uL (ref 0.0–0.1)
Eosinophils Absolute: 0.2 10*3/uL (ref 0.0–0.7)
Eosinophils Relative: 2.6 % (ref 0.0–5.0)
HEMATOCRIT: 38 % (ref 36.0–46.0)
HEMOGLOBIN: 12.4 g/dL (ref 12.0–15.0)
LYMPHS PCT: 28.9 % (ref 12.0–46.0)
Lymphs Abs: 2.8 10*3/uL (ref 0.7–4.0)
MCHC: 32.6 g/dL (ref 30.0–36.0)
MCV: 78.1 fl (ref 78.0–100.0)
MONOS PCT: 5.9 % (ref 3.0–12.0)
Monocytes Absolute: 0.6 10*3/uL (ref 0.1–1.0)
Neutro Abs: 6 10*3/uL (ref 1.4–7.7)
Neutrophils Relative %: 62.2 % (ref 43.0–77.0)
Platelets: 378 10*3/uL (ref 150.0–400.0)
RBC: 4.87 Mil/uL (ref 3.87–5.11)
RDW: 15.9 % — AB (ref 11.5–15.5)
WBC: 9.6 10*3/uL (ref 4.0–10.5)

## 2018-11-12 LAB — BASIC METABOLIC PANEL
BUN: 25 mg/dL — ABNORMAL HIGH (ref 6–23)
CHLORIDE: 94 meq/L — AB (ref 96–112)
CO2: 31 mEq/L (ref 19–32)
Calcium: 9.7 mg/dL (ref 8.4–10.5)
Creatinine, Ser: 1.27 mg/dL — ABNORMAL HIGH (ref 0.40–1.20)
GFR: 44.83 mL/min — AB (ref >60.00)
Glucose, Bld: 116 mg/dL — ABNORMAL HIGH (ref 70–99)
Potassium: 3.7 mEq/L (ref 3.5–5.1)
SODIUM: 137 meq/L (ref 135–145)

## 2018-11-12 NOTE — Telephone Encounter (Signed)
Spoke to pt who states she was seen this am by Dr Einar Pheasant and advised to contact office back with med information. Pt states she was prescribed K+ once daily, and Keppra 500mg  BID

## 2018-11-12 NOTE — Assessment & Plan Note (Signed)
Sleepiness + some confusion in setting of recent seizure diagnosis concerning for possible post-ictal state, though no seizure episodes. Will get some labs today, but also advised reaching out to Neurologist

## 2018-11-12 NOTE — Assessment & Plan Note (Signed)
BP at goal 

## 2018-11-12 NOTE — Assessment & Plan Note (Signed)
Persisting beyond CVA. Ref to ENT to evaluate

## 2018-11-12 NOTE — Patient Instructions (Addendum)
Call back -- with potassium dose, and the seizure medication   Recommend -- calling Dr. Manuella Ghazi office to discuss the sleepiness  Referral for ENT placed  Labs today - to check for anemia and to check potassium

## 2018-11-12 NOTE — Assessment & Plan Note (Signed)
Pt to call back with current potassium dose. Last K 3.4 in our system, will repeat today.

## 2018-11-12 NOTE — Telephone Encounter (Signed)
[id number]

## 2018-11-12 NOTE — Addendum Note (Signed)
Addended by: Carter Kitten on: 11/12/2018 10:21 AM   Modules accepted: Orders

## 2018-11-12 NOTE — Telephone Encounter (Signed)
Updated med list to reflex medication.   Please call patient back to clarify dose of Potassium

## 2018-11-12 NOTE — Progress Notes (Addendum)
Subjective:     Morgan [name] is a 67 y.o. female presenting for Establish Care and Fatigue     HPI   2 weeks ago thinks she had a seizure- went to [place]  #mental health - goes to [facility] for depression - denies recent depression - 8 years ago had a suicide attempt, but none since   #prior stroke 08/2017 - hospital for 1 month and followed by rehab - sees Dr. Manuella Ghazi - got better from stoke - had another episode in 08/2018 - and has been in PT since then  - [place] and got put back on the walker - with some weakness - left sided weakness  #Cardiac - uses the lasix -- for heart   #excessive fatigue - no fever/chills - no cold symptoms - just extremely tired - no exercise or change in routine prior to sleepiness - continued remeron during episodes - no palpitations - endorses some confusion during episodes - endorses HA during episodes    10/16/18: ER - Jaw swelling, neg work-up 09/08/18: ER - migraine - treated and improved 9/6-8/19: Admission -  r/o stroke with normal MRI - suspect migraine  Review of Systems See HPI  Social History   Tobacco Use  Smoking Status Former Smoker  . Packs/day: 1.00  . Years: 15.00  . Pack years: 15.00  . Types: Cigarettes  . Last attempt to quit: 09/12/2017  . Years since quitting: 1.1  Smokeless Tobacco Never Used        Objective:    BP Readings from Last 3 Encounters:  11/12/18 120/80  10/16/18 (!) 143/90  09/08/18 137/78   Wt Readings from Last 3 Encounters:  11/12/18 225 lb (102.1 kg)  10/16/18 228 lb (103.4 kg)  09/08/18 222 lb (100.7 kg)    BP 120/80   Pulse 81   Temp 98 F (36.7 C) (Oral)   Ht 5' 10.5" (1.791 m)   Wt 225 lb (102.1 kg)   BMI 31.83 kg/m    Physical Exam  Constitutional: She appears well-developed and well-nourished. No distress.  HENT:  Head: Normocephalic and atraumatic.  Right Ear: External ear normal.  Left Ear: External  ear normal.  Eyes: EOM are normal. No scleral icterus.  Lower lid pallor  Neck: Neck supple.  Cardiovascular: Normal rate, regular rhythm and normal heart sounds.  Pulmonary/Chest: Effort normal and breath sounds normal. No stridor. No respiratory distress. She has no wheezes.  Neurological: She is alert.  Skin: Skin is warm and dry. Capillary refill takes less than 2 seconds. She is not diaphoretic.  Psychiatric: She has a normal mood and affect.          Assessment & Plan:   Problem List Items Addressed This Visit      Cardiovascular and Mediastinum   Essential hypertension - Primary    BP at goal      Relevant Orders   Basic metabolic panel     Respiratory   Oropharyngeal dysphagia   Relevant Orders   Ambulatory referral to ENT     Other   Hypokalemia    Pt to call back with current potassium dose. Last K 3.4 in our system, will repeat today.       Relevant Orders   Basic metabolic panel   Hoarseness    Persisting beyond CVA. Ref to ENT to evaluate      Relevant Orders   Ambulatory referral to ENT  Sleepiness    Sleepiness + some confusion in setting of recent seizure diagnosis concerning for possible post-ictal state, though no seizure episodes. Will get some labs today, but also advised reaching out to Neurologist      Relevant Orders   CBC with Differential      Unclear exactly what the gabapentin and mirtazapine are for. Will get [facility] records to gain better understanding of mental health. Appears stable today per history with lots of support.   Return in about 3 months (around 02/12/2019).  [name], MD

## 2018-11-18 ENCOUNTER — Telehealth: Payer: Self-pay

## 2018-11-18 NOTE — Telephone Encounter (Signed)
Left message for patient to call back, see lab results note.

## 2018-12-10 IMAGING — US US CAROTID DUPLEX BILAT
1 series · 13 of 24 positions shown · non-contrast
Comparison: 10/05/2017 MRI

CLINICAL DATA: Right-sided weakness, hypertension, syncope

EXAM:
BILATERAL CAROTID DUPLEX ULTRASOUND
TECHNIQUE: Gray scale imaging, color Doppler and duplex ultrasound were
performed of bilateral carotid and vertebral arteries in the neck.

[Series 1: us carotid duplex bilat · 0.06mm/px · 13 of 64 slices shown]
[im 1/64]
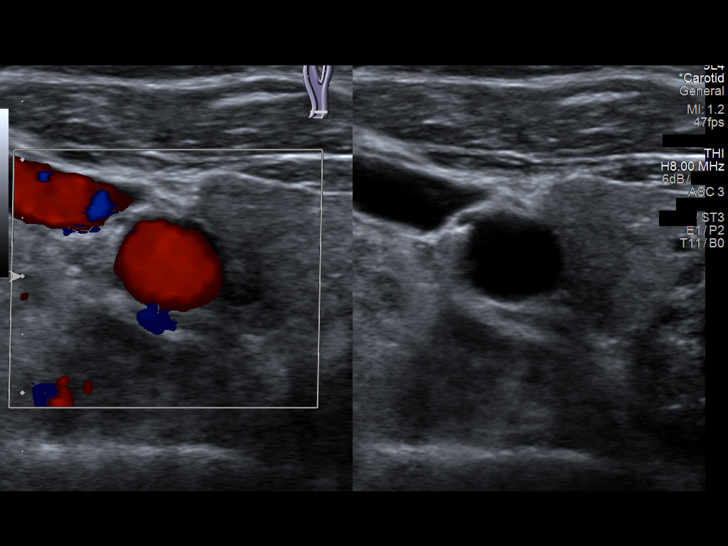
[im 6/64]
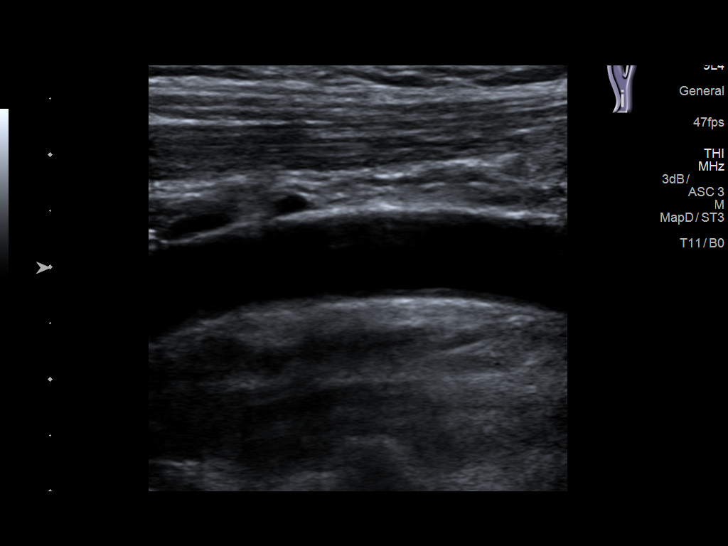
[im 11/64]
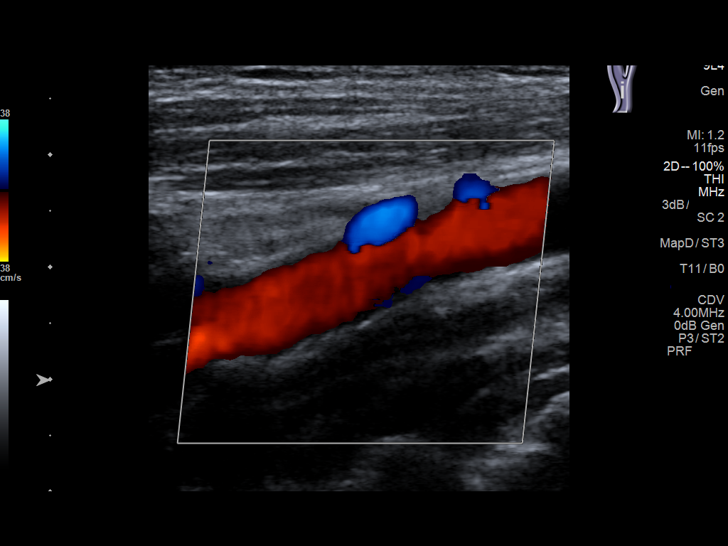
[im 17/64]
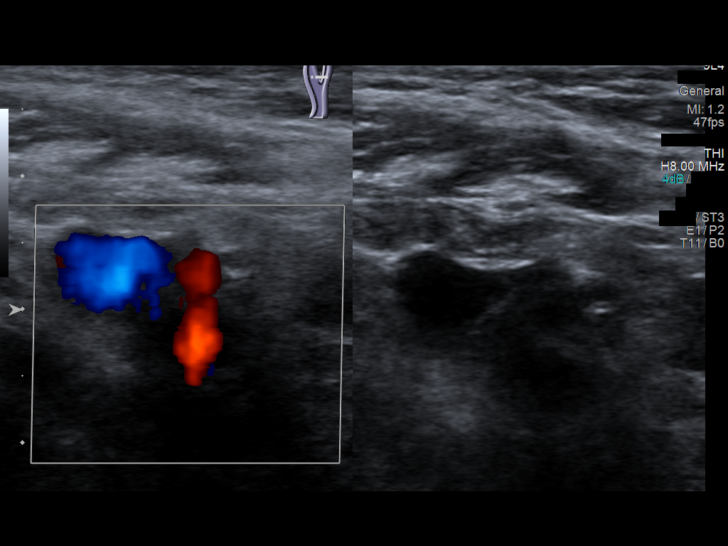
[im 22/64]
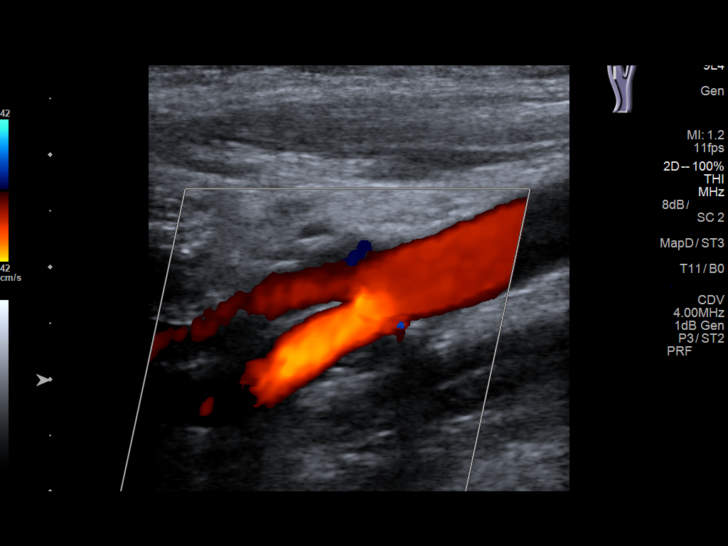
[im 28/64]
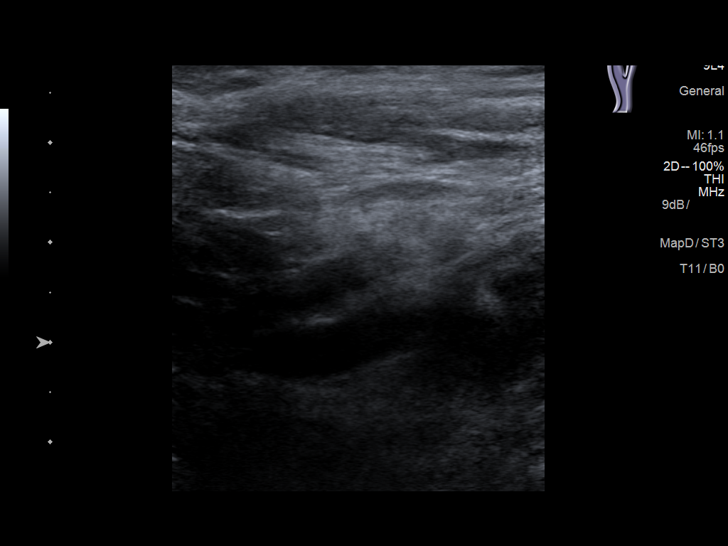
[im 33/64]
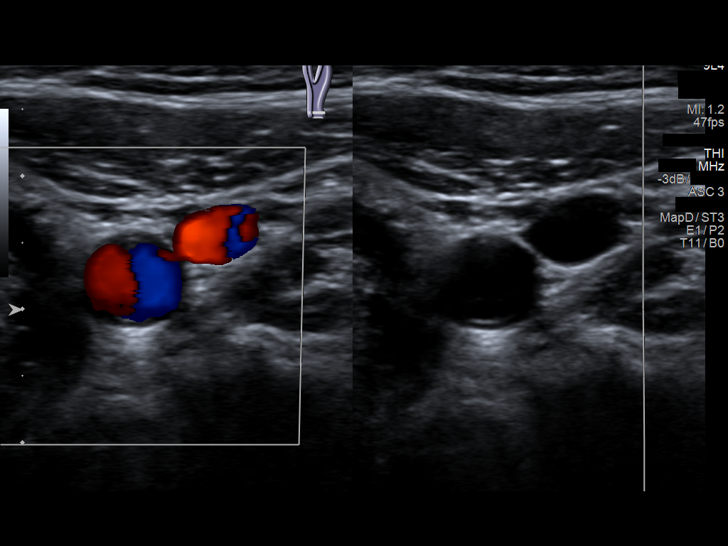
[im 36/64]
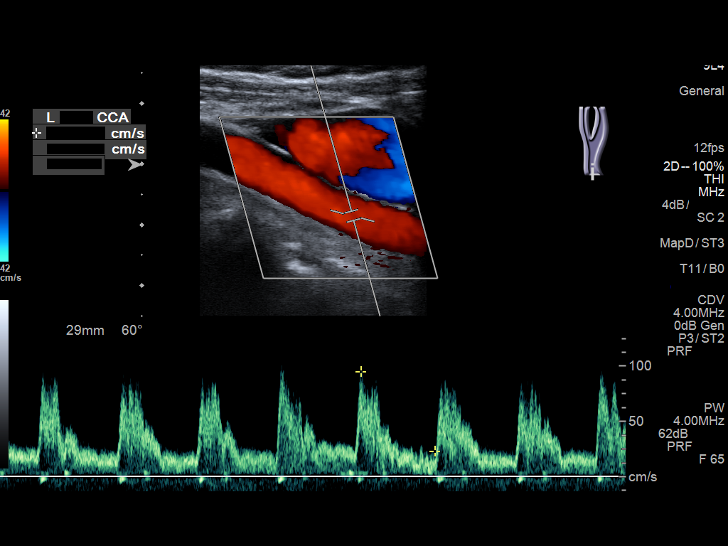
[im 42/64]
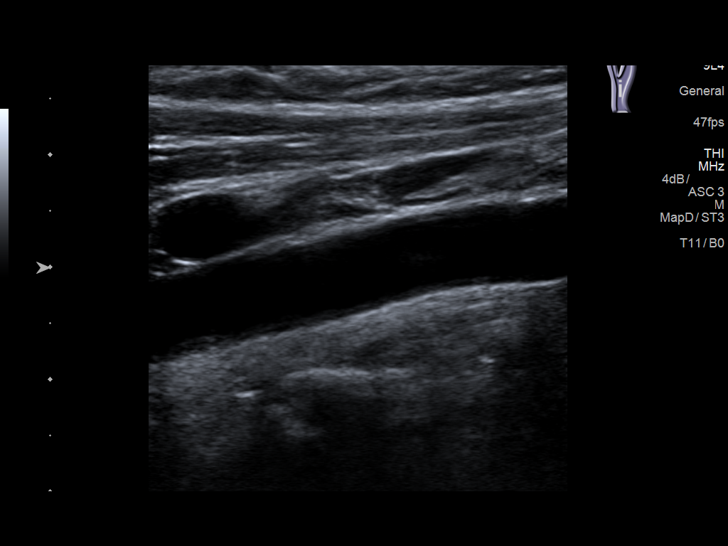
[im 47/64]
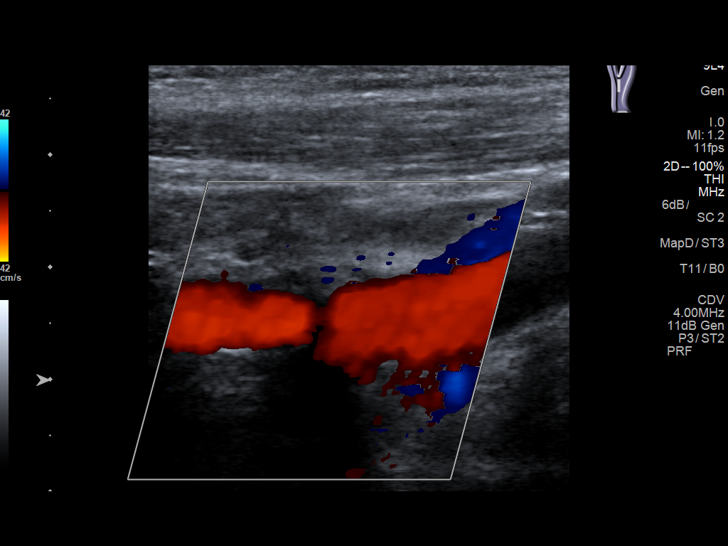
[im 53/64]
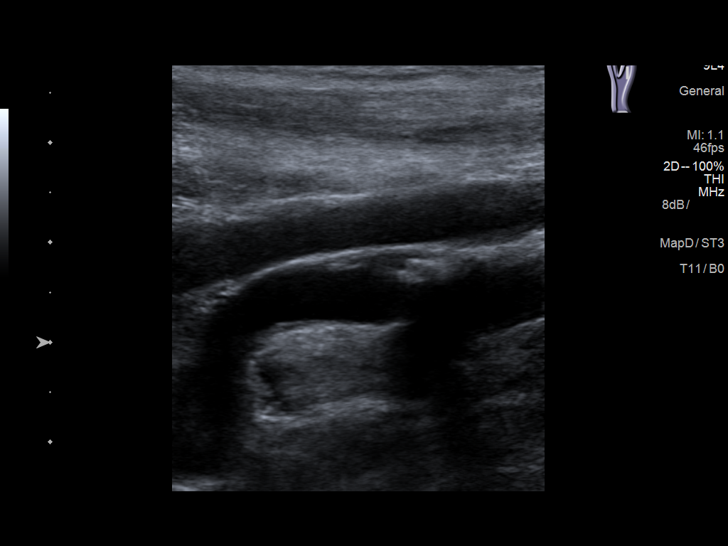
[im 58/64]
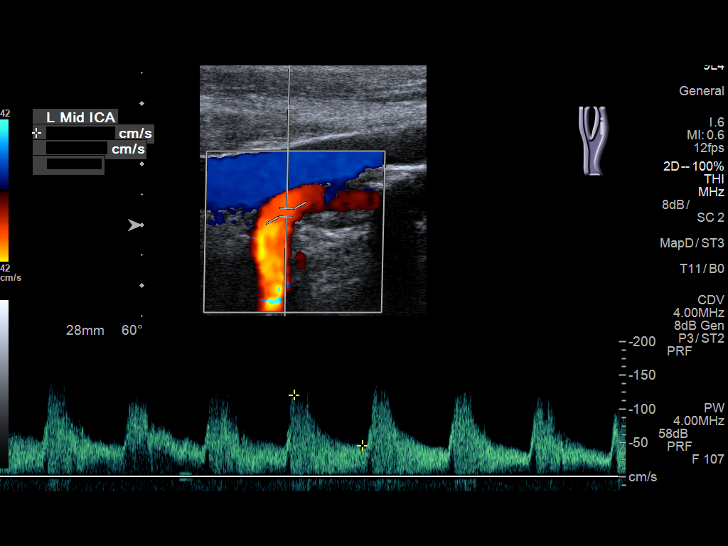
[im 64/64]
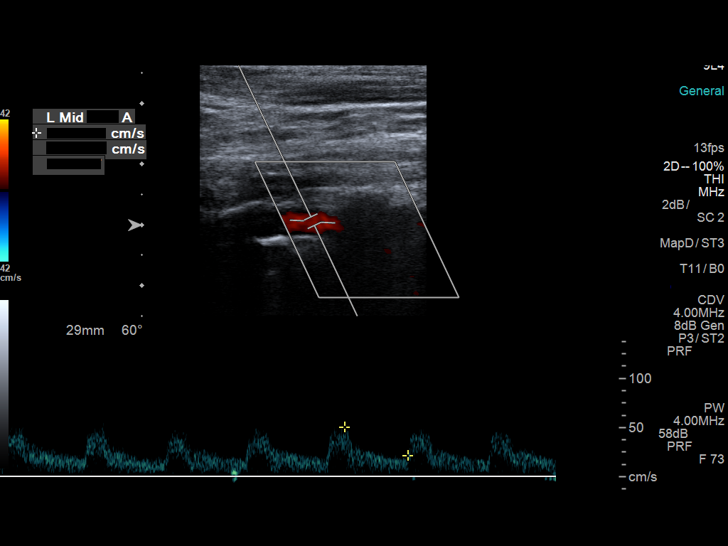

[13 of 24 positions shown; findings below may reference images not displayed]

FINDINGS: Criteria: Quantification of carotid stenosis is based on velocity
parameters that correlate the residual internal carotid diameter
with NASCET-based stenosis levels, using the diameter of the distal
internal carotid lumen as the denominator for stenosis measurement.

The following velocity measurements were obtained:

RIGHT

ICA:  150/54 cm/sec

CCA:  78/24 cm/sec

SYSTOLIC ICA/CCA RATIO:

DIASTOLIC ICA/CCA RATIO:

ECA:  99 cm/sec

LEFT

ICA:  121/46 cm/sec

CCA:  107/26 cm/sec

SYSTOLIC ICA/CCA RATIO:

DIASTOLIC ICA/CCA RATIO:

ECA:  76 cm/sec

RIGHT CAROTID ARTERY: Minor echogenic shadowing plaque formation. No
hemodynamically significant right ICA stenosis, velocity elevation,
or turbulent flow. Degree of narrowing less than 50%.

RIGHT VERTEBRAL ARTERY:  Antegrade

LEFT CAROTID ARTERY: Similar scattered minor echogenic plaque
formation. No hemodynamically significant left ICA stenosis,
velocity elevation, or turbulent flow.

LEFT VERTEBRAL ARTERY:  Antegrade
IMPRESSION: Moderate bilateral carotid atherosclerosis. No hemodynamically
significant ICA stenosis. Degree of narrowing less than 50%
bilaterally by ultrasound criteria.

Patent antegrade vertebral flow bilaterally

## 2018-12-14 IMAGING — DX DG CHEST 1V PORT
1 series · 1 of 1 positions shown · non-contrast
Comparison: 08/26/2017

CLINICAL DATA: Cough

EXAM:
PORTABLE CHEST 1 VIEW

[chest ap]
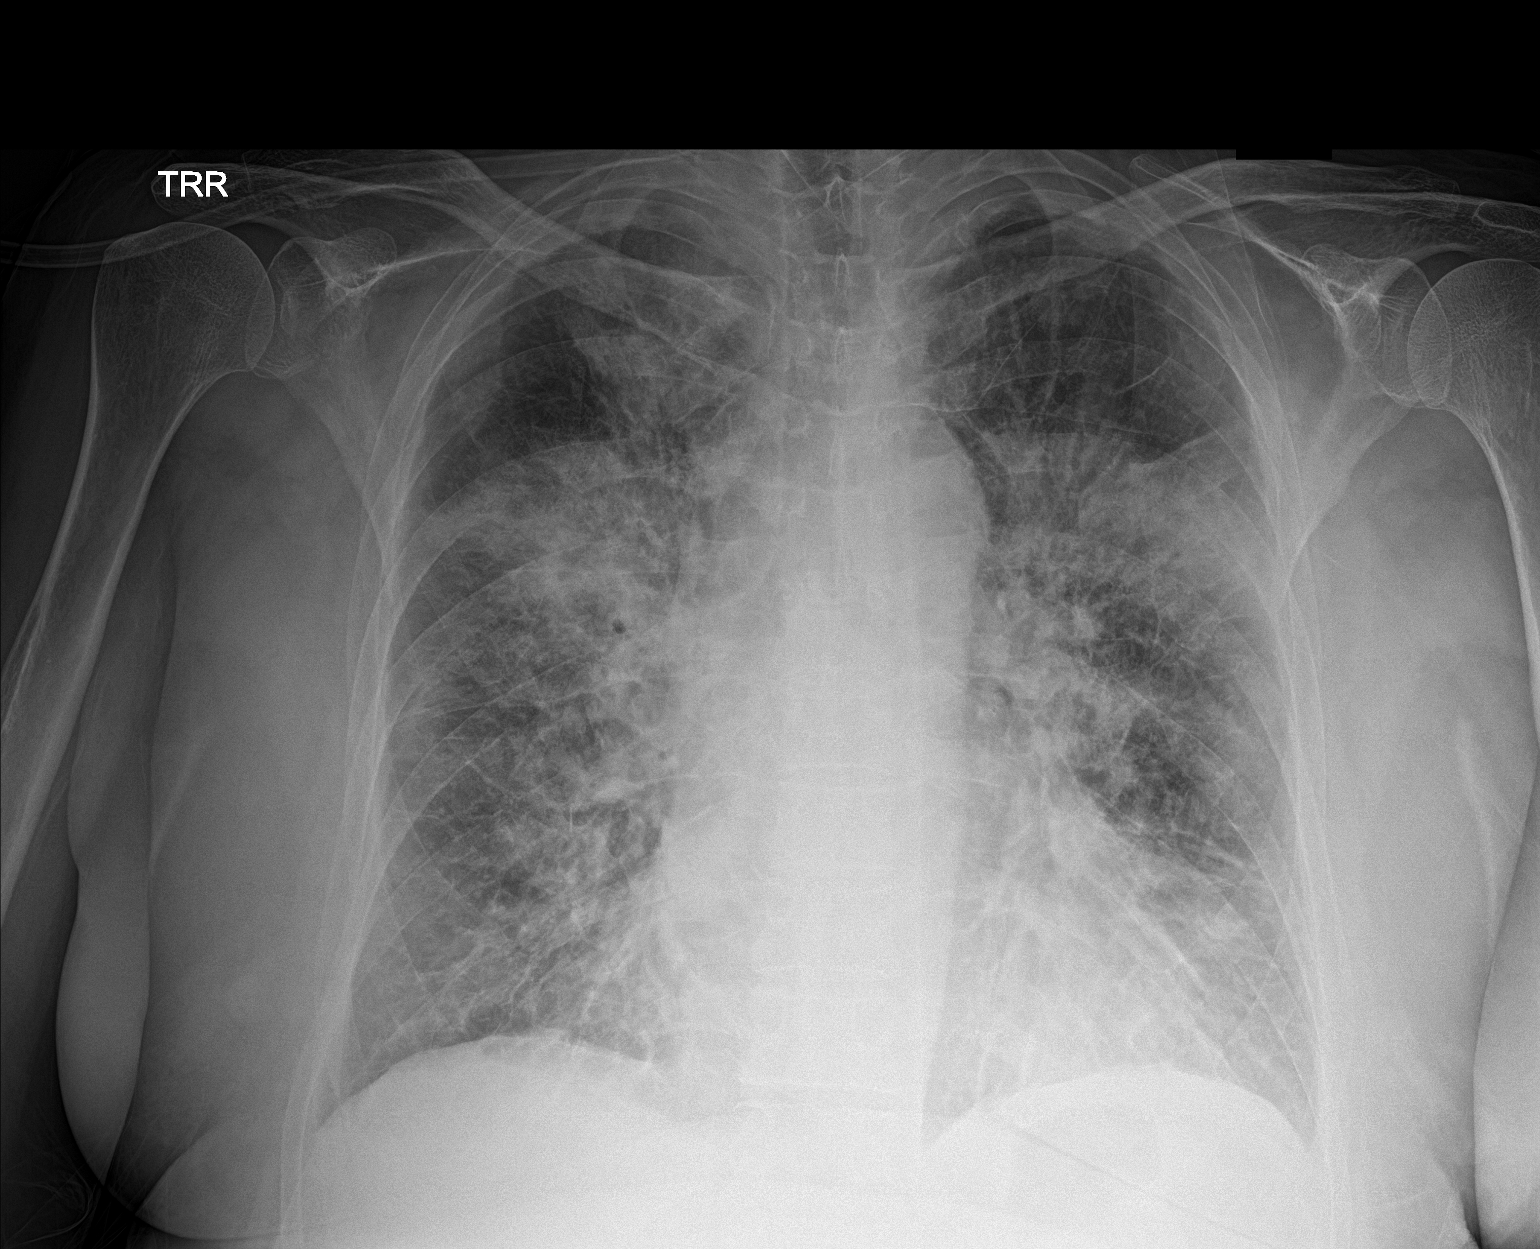

[1 of 1 positions shown; findings below may reference images not displayed]

FINDINGS: Cardiac shadow is stable. Previously seen bilateral multifocal
infiltrates have increased in the interval from the prior exam
particularly in the upper lobes. No sizable effusion is seen. No
acute bony abnormality is noted.
IMPRESSION: Increase in bilateral infiltrates when compared with the prior exam.

## 2018-12-16 IMAGING — DX DG CHEST 1V
1 series · 1 of 1 positions shown · non-contrast
Comparison: Portable exam 3009 hours compared to 0388 hours

CLINICAL DATA: Encounter for evaluation of nasogastric tube
placement

EXAM:
CHEST 1 VIEW

[chest ap]
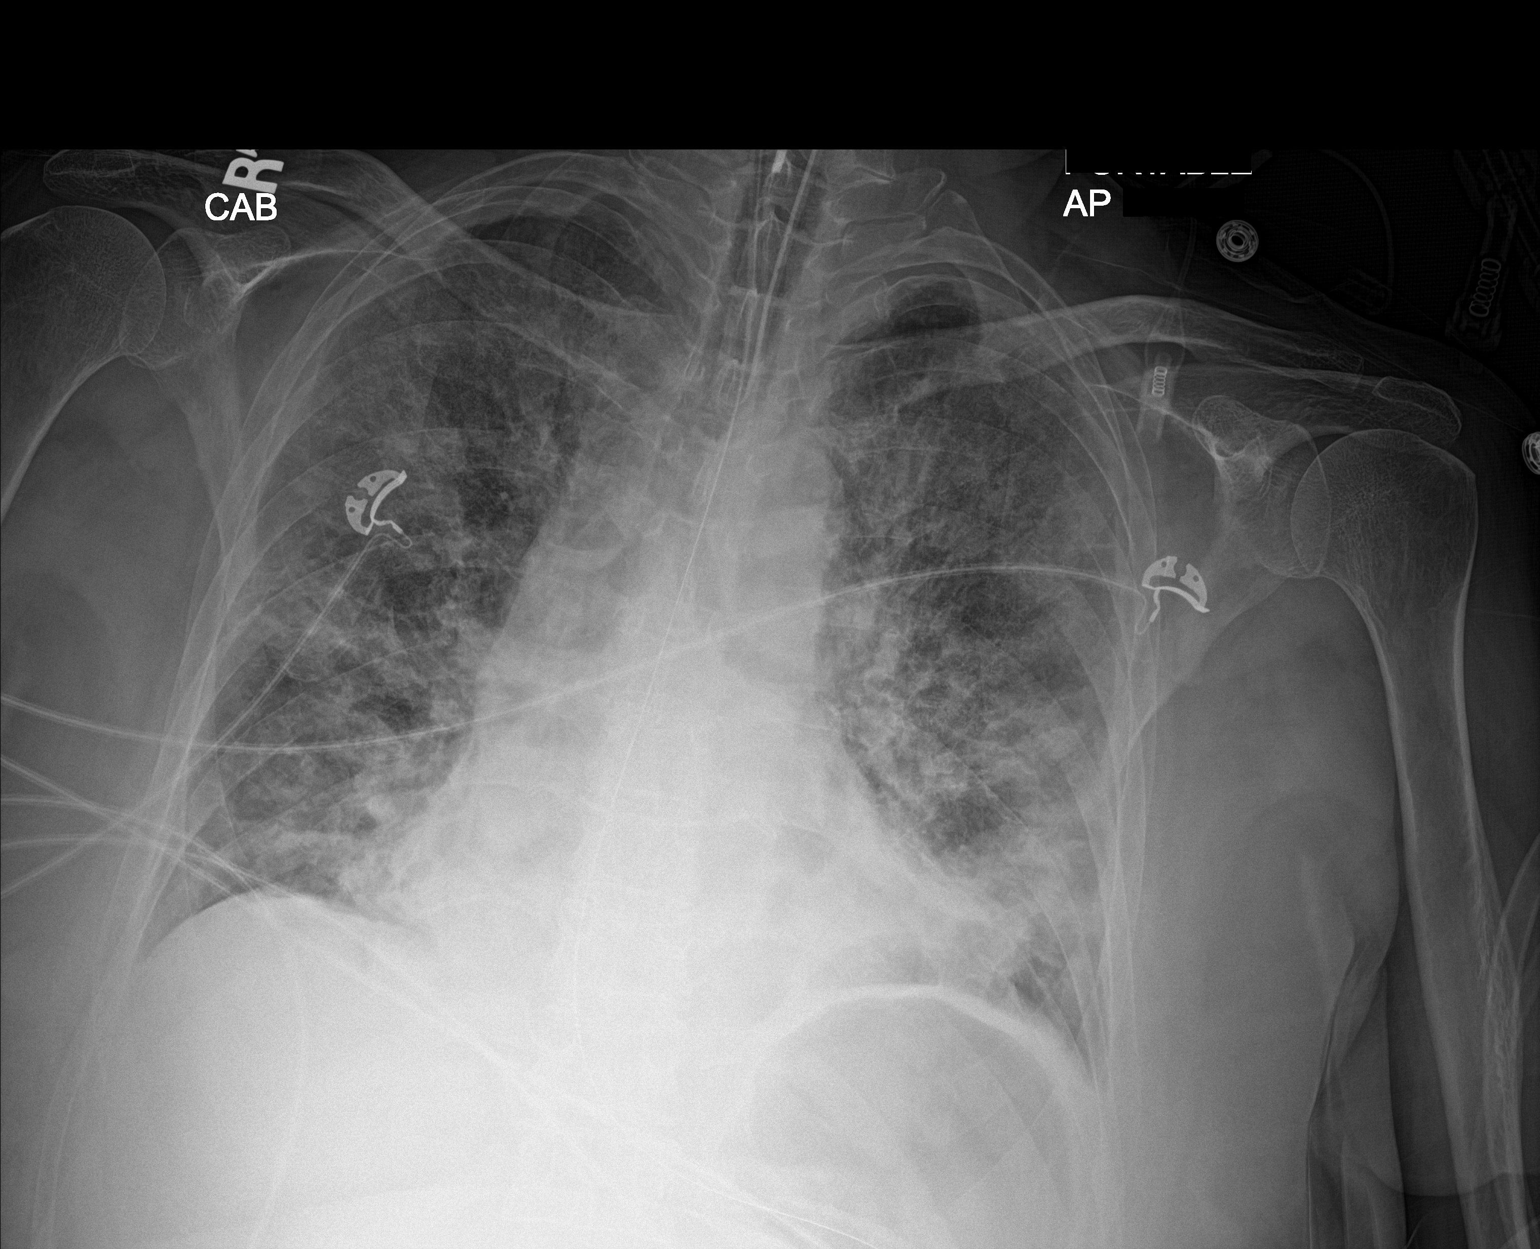

[1 of 1 positions shown; findings below may reference images not displayed]

FINDINGS: New endotracheal tube with tip projecting 4.5 cm above carina.

New nasogastric tube with tip projecting over stomach.

Borderline enlargement of cardiac silhouette.

Extensive BILATERAL pulmonary infiltrates, little changed.

No gross pleural effusion or pneumothorax.

Bones demineralized.
IMPRESSION: Persistent severe diffuse BILATERAL pulmonary infiltrates.

## 2018-12-16 IMAGING — DX DG ABDOMEN 1V
2 series · 2 of 2 positions shown · non-contrast
Comparison: 01/19/2008

CLINICAL DATA: Abdominal pain

EXAM:
ABDOMEN - 1 VIEW

[abdomen kub (1 of 2)]
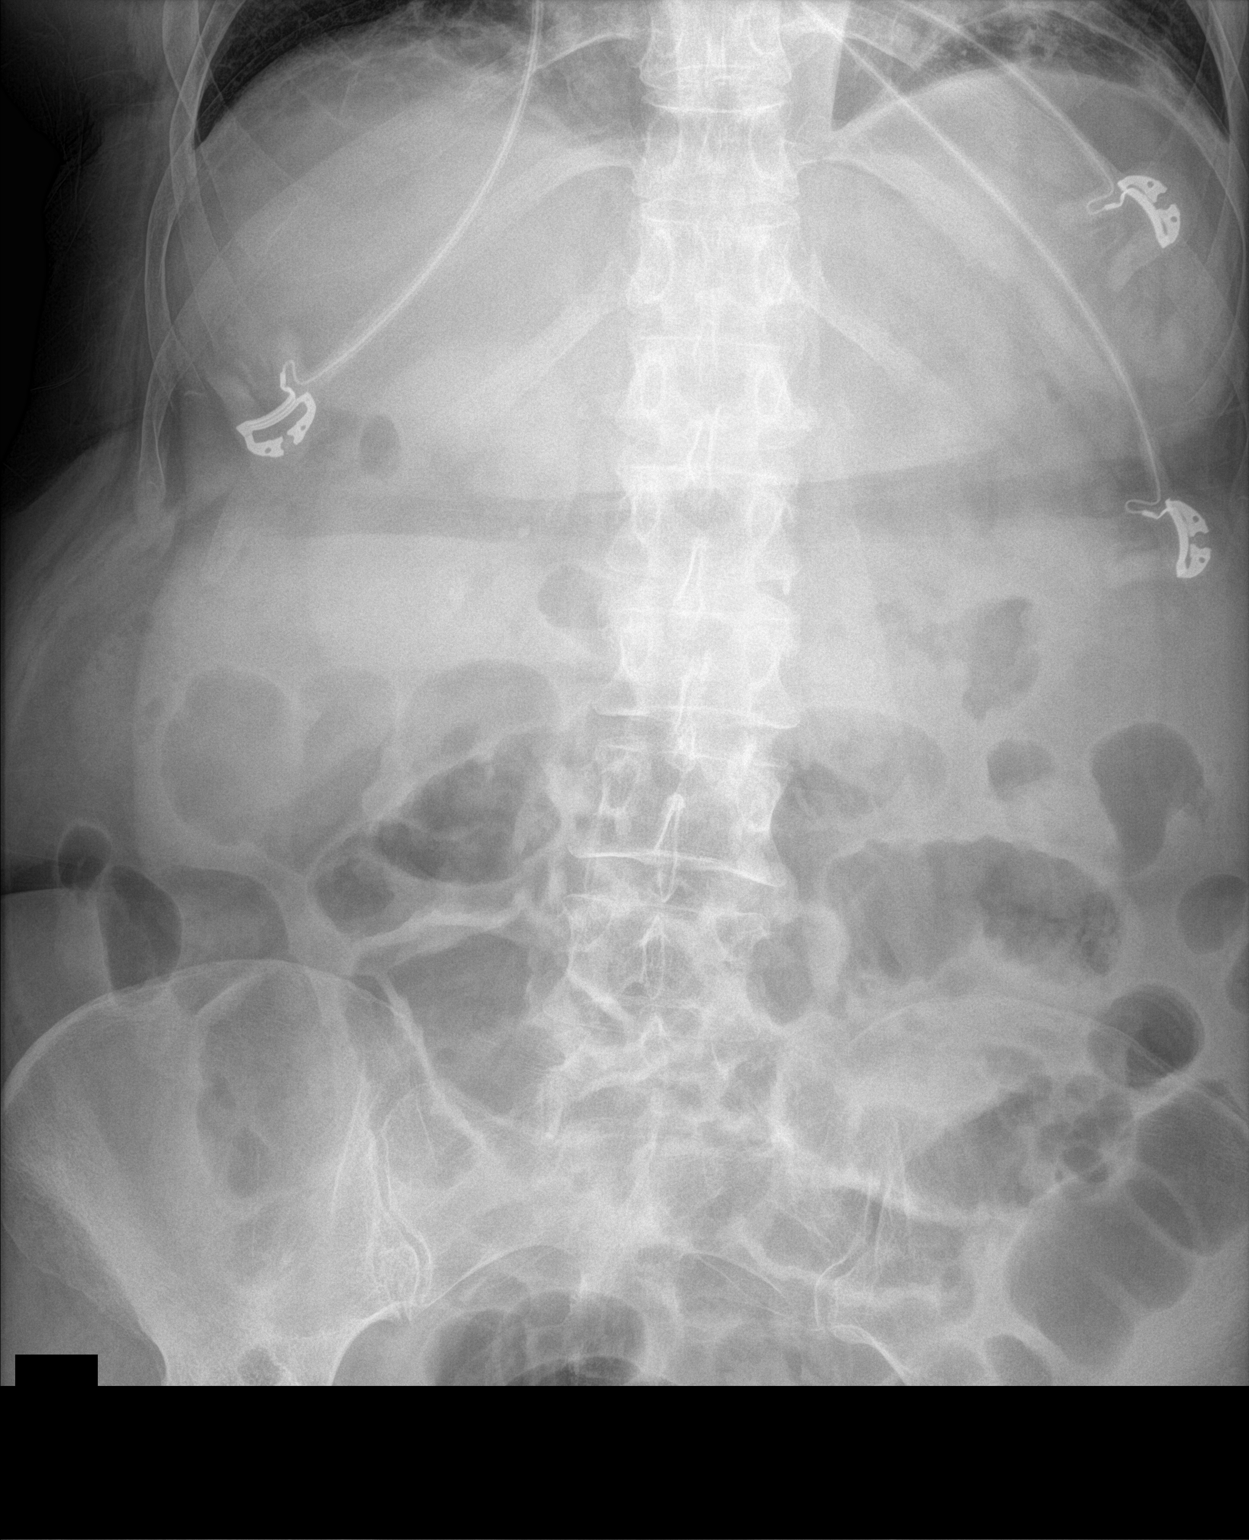

[abdomen kub (2 of 2)]
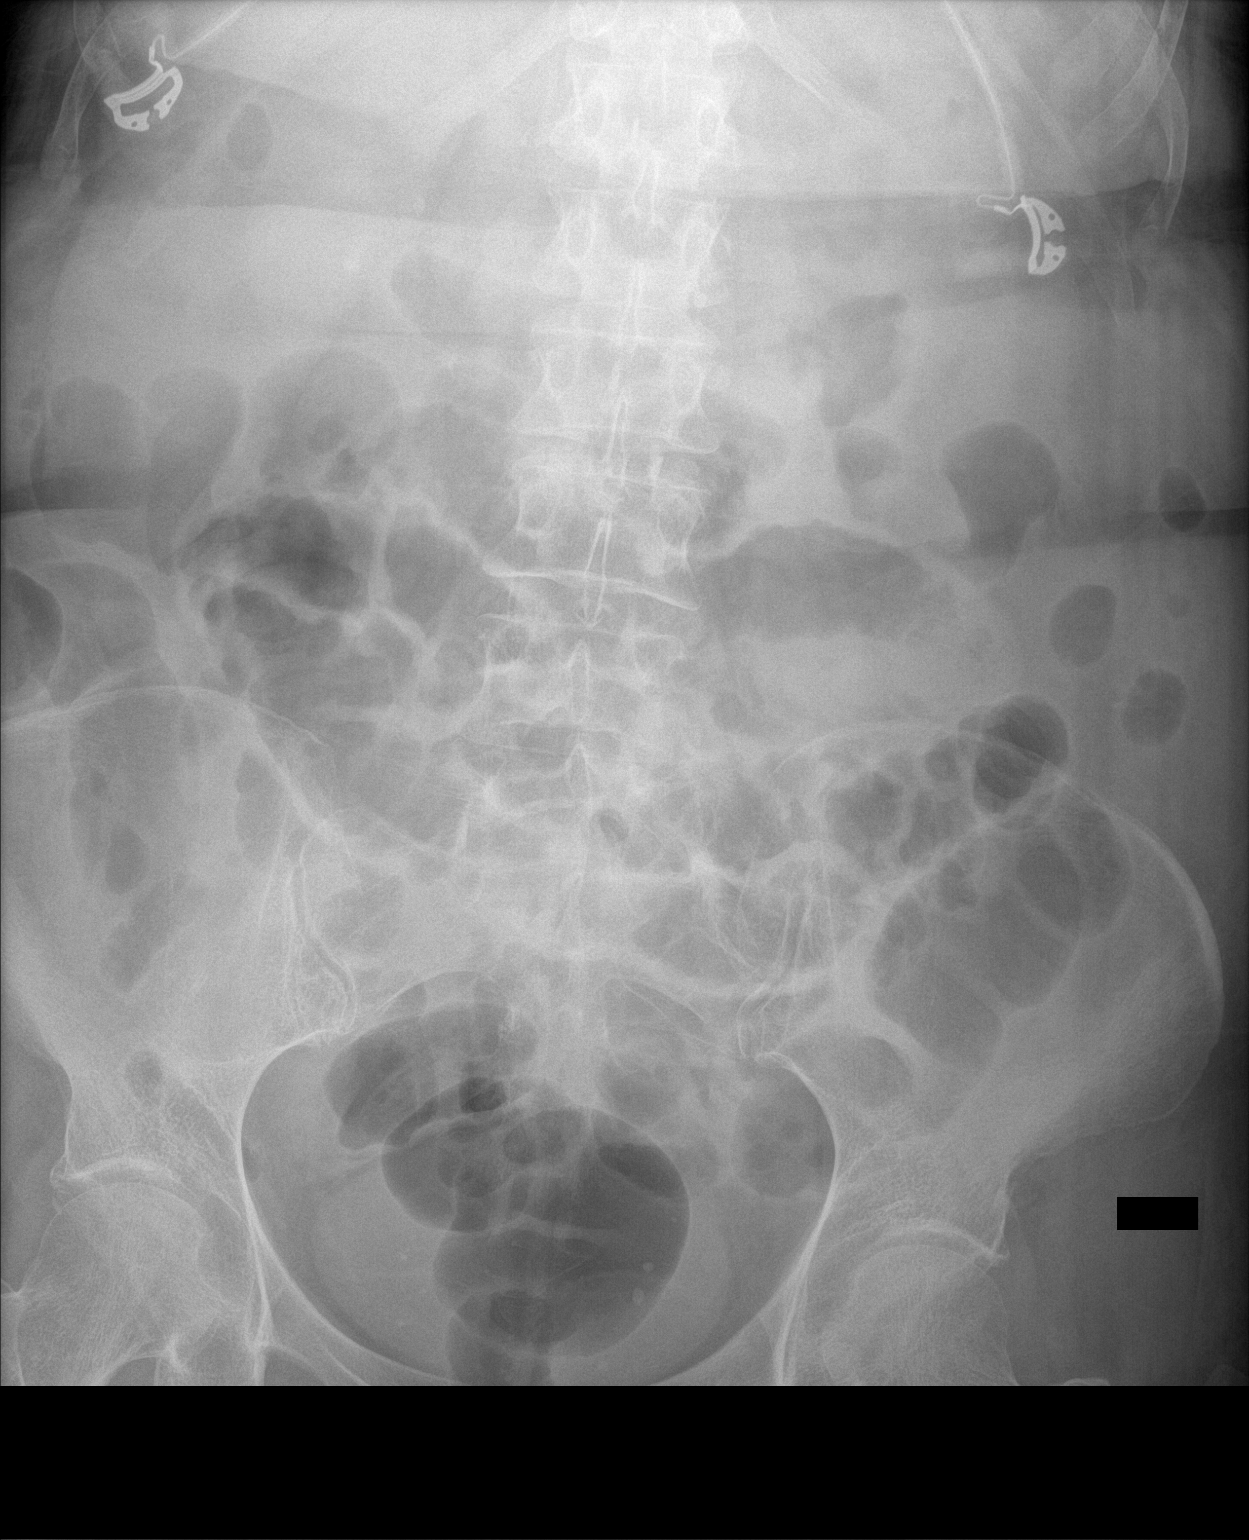

[2 of 2 positions shown; findings below may reference images not displayed]

FINDINGS: Supine view the abdomen shows diffuse gaseous distention of large
and small bowel without an overtly obstructive pattern. Lucency
identified in the region of the left hemidiaphragm, nonspecific but
likely related to left lower lobe disease better seen on the upright
portable chest x-ray obtained at the same time and reported
separately. Multiple phleboliths are seen over the inferior anatomic
pelvis. There may be a stone in the lower pole the right kidney.
Visualized bony anatomy is unremarkable.
IMPRESSION: Diffuse gaseous bowel distention without overt obstruction by x-ray.

## 2018-12-16 IMAGING — DX DG CHEST 1V PORT
1 series · 1 of 1 positions shown · non-contrast
Comparison: 08/28/2017.

CLINICAL DATA: Respiratory failure.

EXAM:
PORTABLE CHEST 1 VIEW

[chest ap]
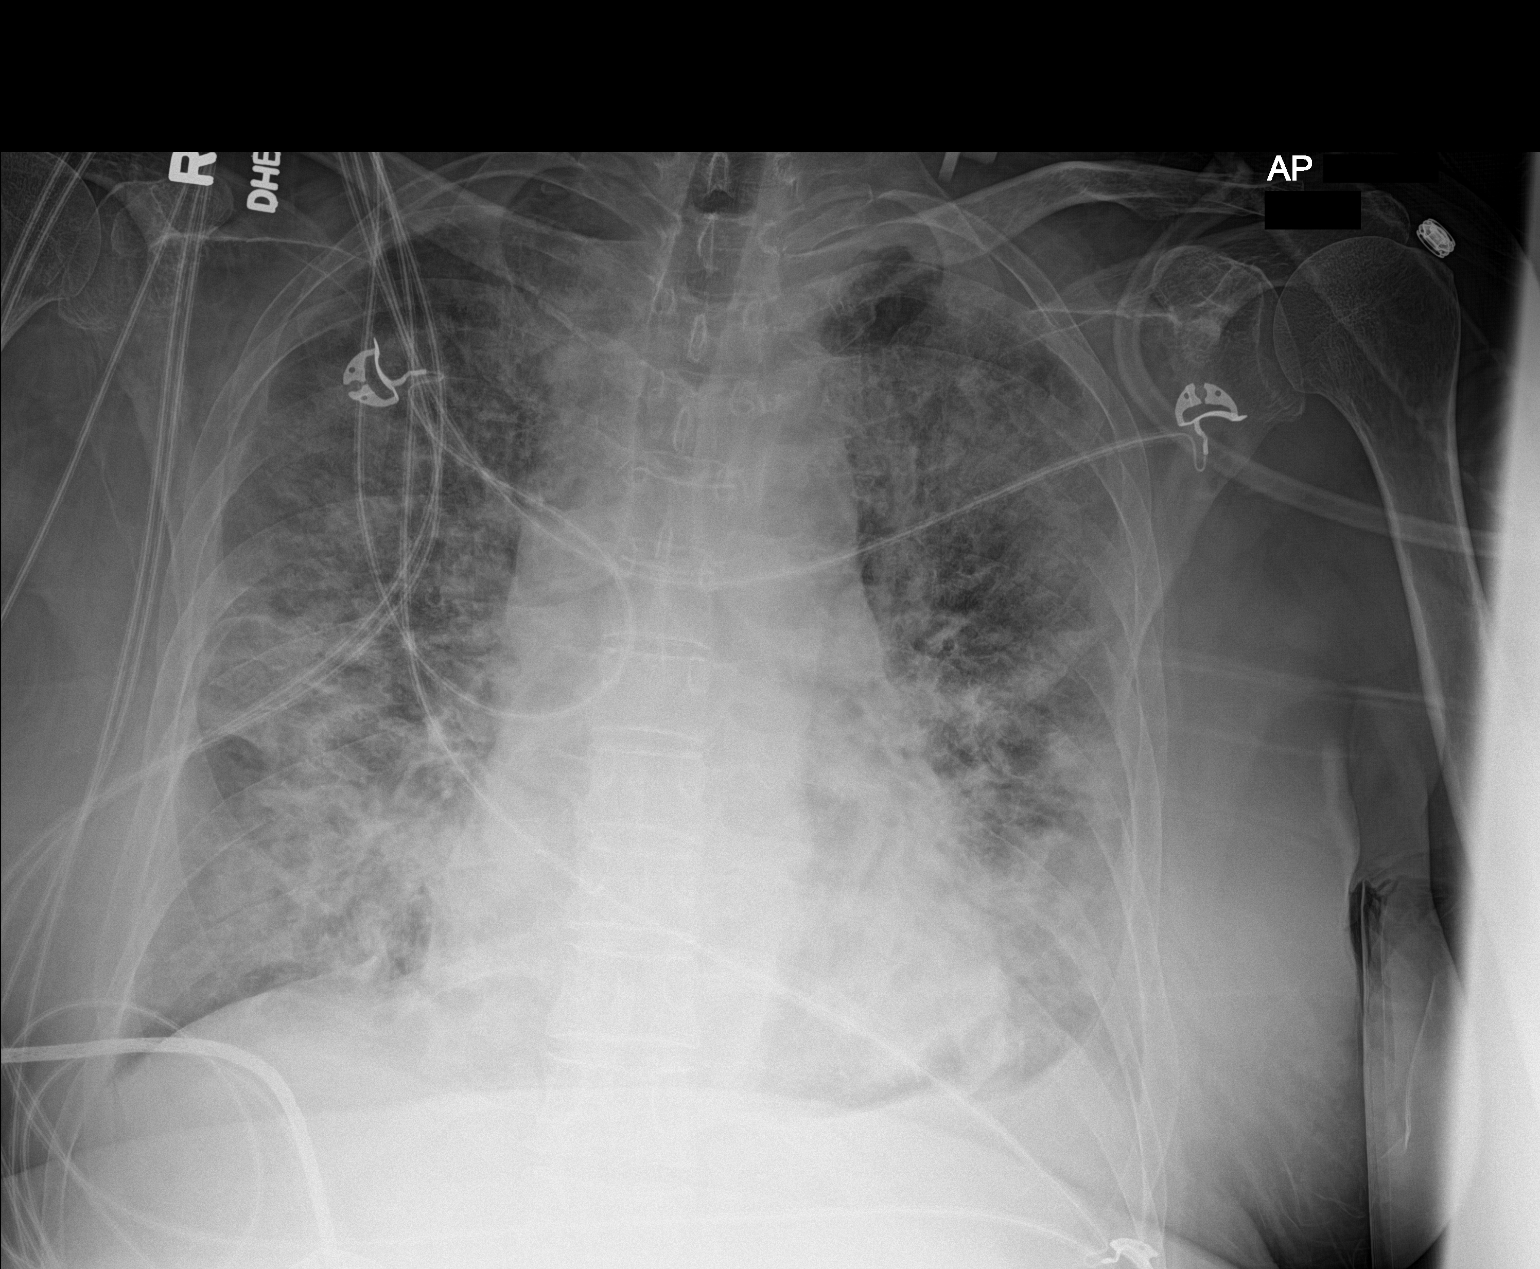

[1 of 1 positions shown; findings below may reference images not displayed]

FINDINGS: Heart size is stable. Diffuse bilateral severe airspace disease
again noted with interim progression from prior exam. Small left
pleural effusion. No pneumothorax.
IMPRESSION: Diffuse severe bilateral airspace disease with progression from
prior exam. Small left pleural effusion.

## 2018-12-16 IMAGING — DX DG CHEST 1V PORT
1 series · 1 of 1 positions shown · non-contrast
Comparison: Portable chest x-ray of earlier today.

CLINICAL DATA: Confirm line placement.

EXAM:
PORTABLE CHEST 1 VIEW

[chest ap]
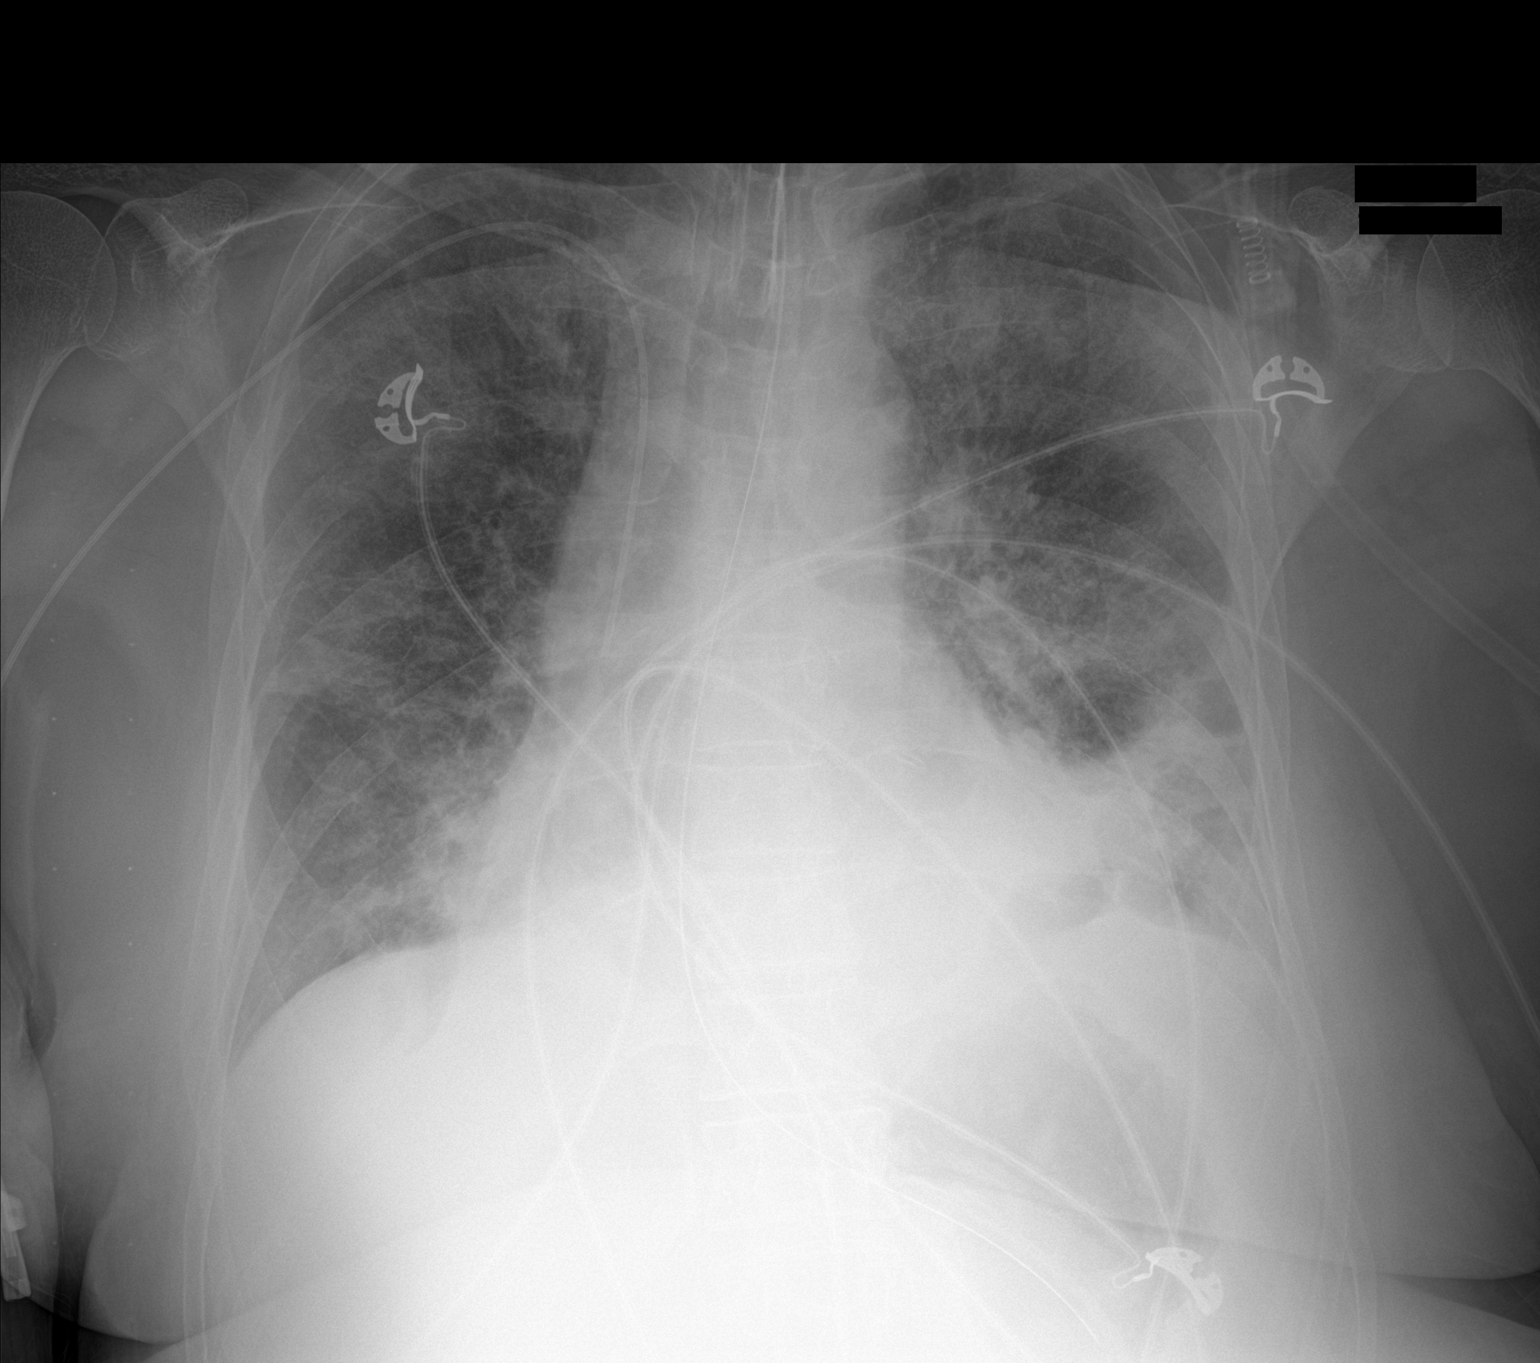

[1 of 1 positions shown; findings below may reference images not displayed]

FINDINGS: The patient has undergone placement of a right-sided PICC line. The
tip projects over the midportion of the SVC. The endotracheal tube
tip projects approximately 3.6 cm above the carina. The
esophagogastric tube tip projects below the inferior margin of the
image. The lungs are well-expanded. The interstitial markings remain
increased with areas of patchy alveolar opacity greatest at the left
base. The heart is top-normal in size. The pulmonary vascularity is
not clearly engorged.
IMPRESSION: Interval placement of right-sided PICC line with the tip in the
region of the midportion of the SVC. The other support tubes are in
reasonable position.

Persistent bilateral interstitial and alveolar infiltrates
compatible with pneumonia.

## 2018-12-17 IMAGING — DX DG CHEST 1V PORT
1 series · 1 of 1 positions shown · non-contrast
Comparison: 08/29/2017 chest radiograph

CLINICAL DATA: 66 y/o  F; acute respiratory failure.

EXAM:
PORTABLE CHEST 1 VIEW

[chest ap]
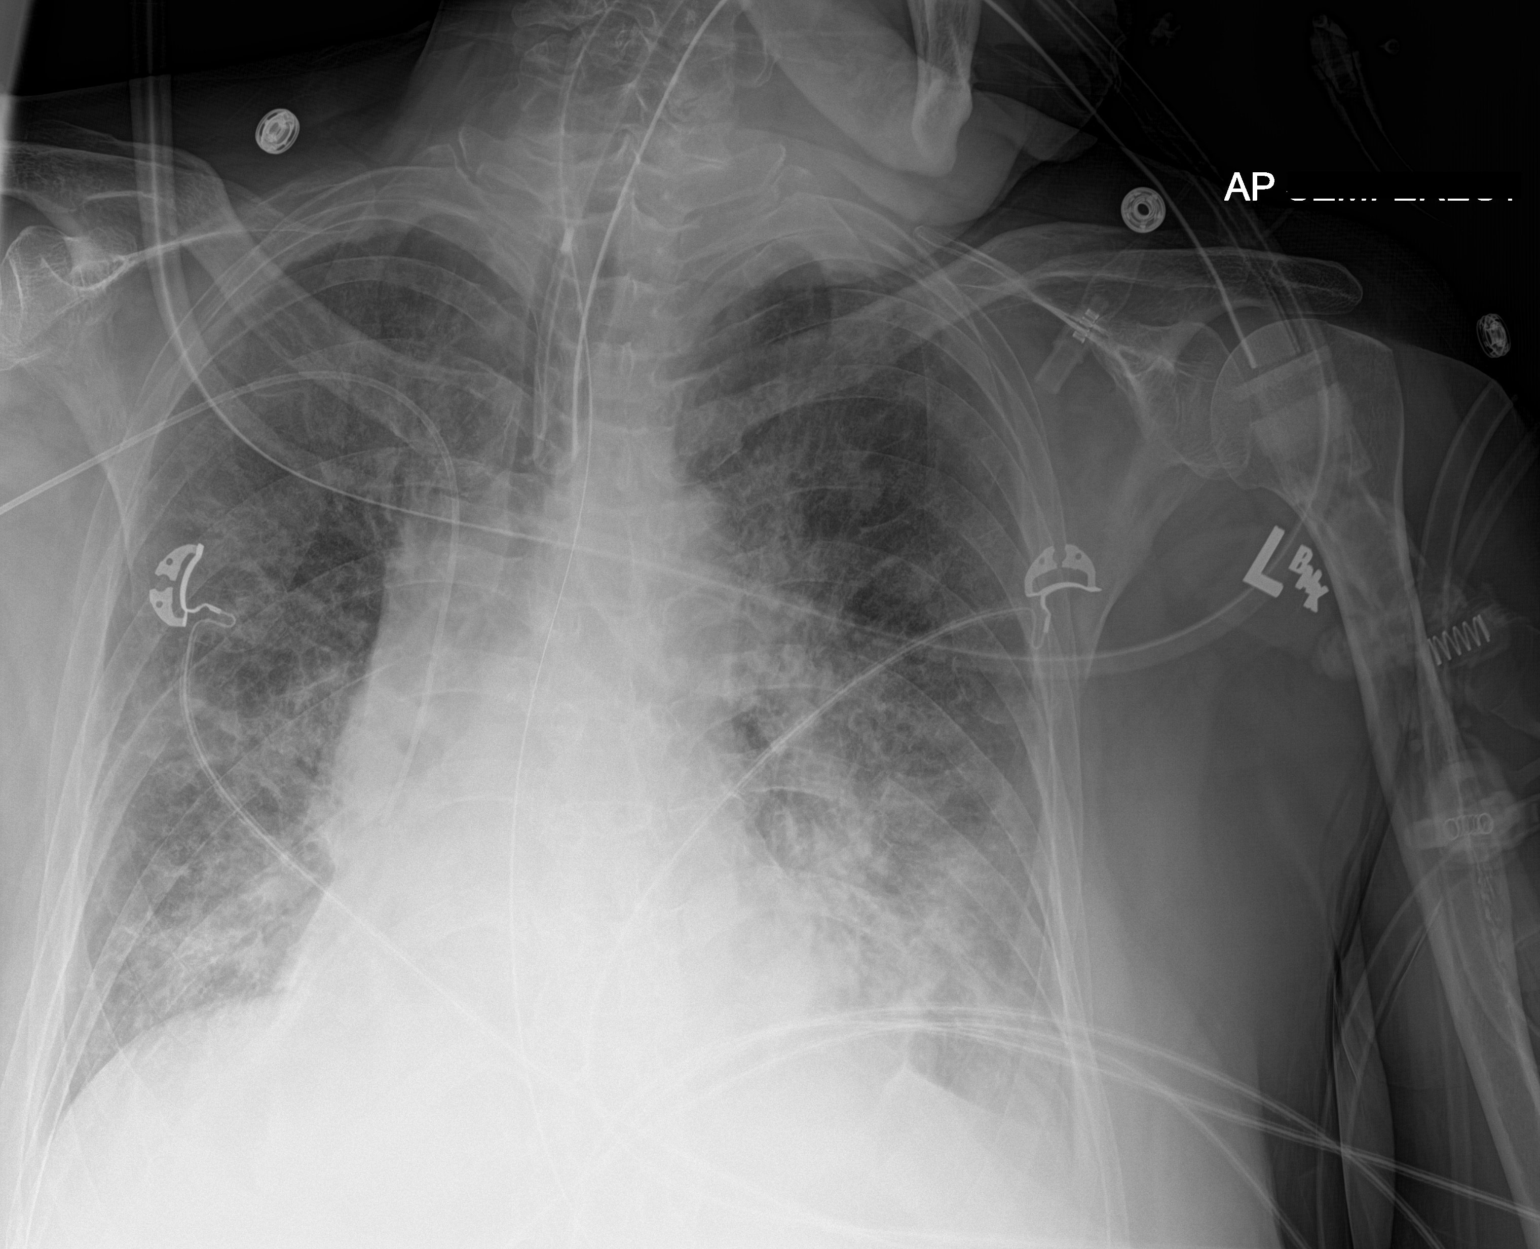

[1 of 1 positions shown; findings below may reference images not displayed]

FINDINGS: Stable cardiac silhouette. Stable endotracheal tube 3.5 cm from
carina. Stable right PICC line tip projecting over lower SVC.
Enteric tube tip below the field of view and abdomen. Stable diffuse
airspace consolidations. No acute osseous abnormality is evident.
IMPRESSION: Stable lines and tubes.  Stable diffuse airspace consolidations.

By: Blanca Nieves Paez Garcia M.D.

## 2018-12-19 IMAGING — DX DG CHEST 1V PORT
1 series · 1 of 1 positions shown · non-contrast
Comparison: 08/31/2017

CLINICAL DATA: assisted ventilation.

EXAM:
PORTABLE CHEST 1 VIEW

[chest ap]
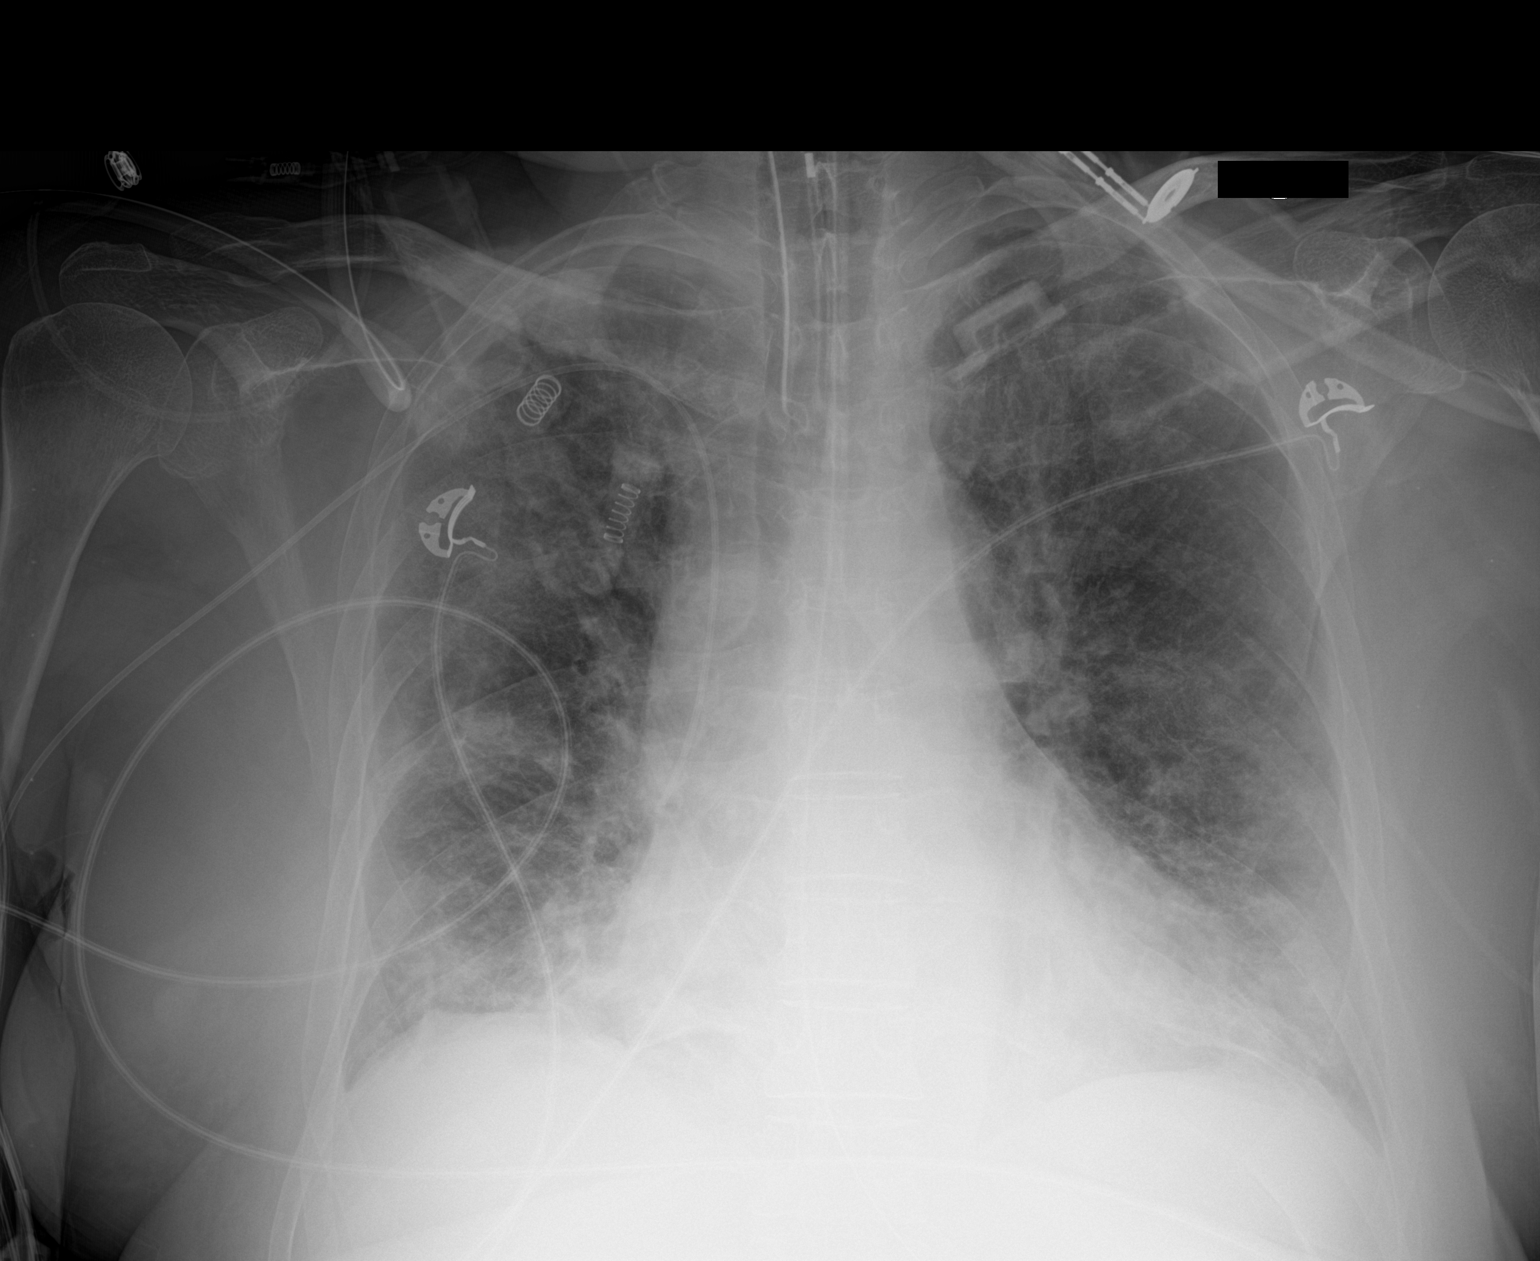

[1 of 1 positions shown; findings below may reference images not displayed]

FINDINGS: Endotracheal tube terminates 5.7 cm above carina.Nasogastric extends
beyond the inferior aspect of the film. Right-sided PICC line
terminates at the superior caval/ atrial junction. Midline trachea.
Borderline cardiomegaly. No pleural effusion or pneumothorax. Patchy
bibasilar airspace disease is similar. Underlying interstitial
thickening remains.
IMPRESSION: No significant change in bibasilar airspace disease.

## 2018-12-21 IMAGING — DX DG ABD PORTABLE 1V
3 series · 3 of 3 positions shown · non-contrast
Comparison: Abdomen films of 08/29/2017

CLINICAL DATA: Abdominal distention, vomiting

EXAM:
PORTABLE ABDOMEN - 1 VIEW

[abdomen kub (1 of 3)]
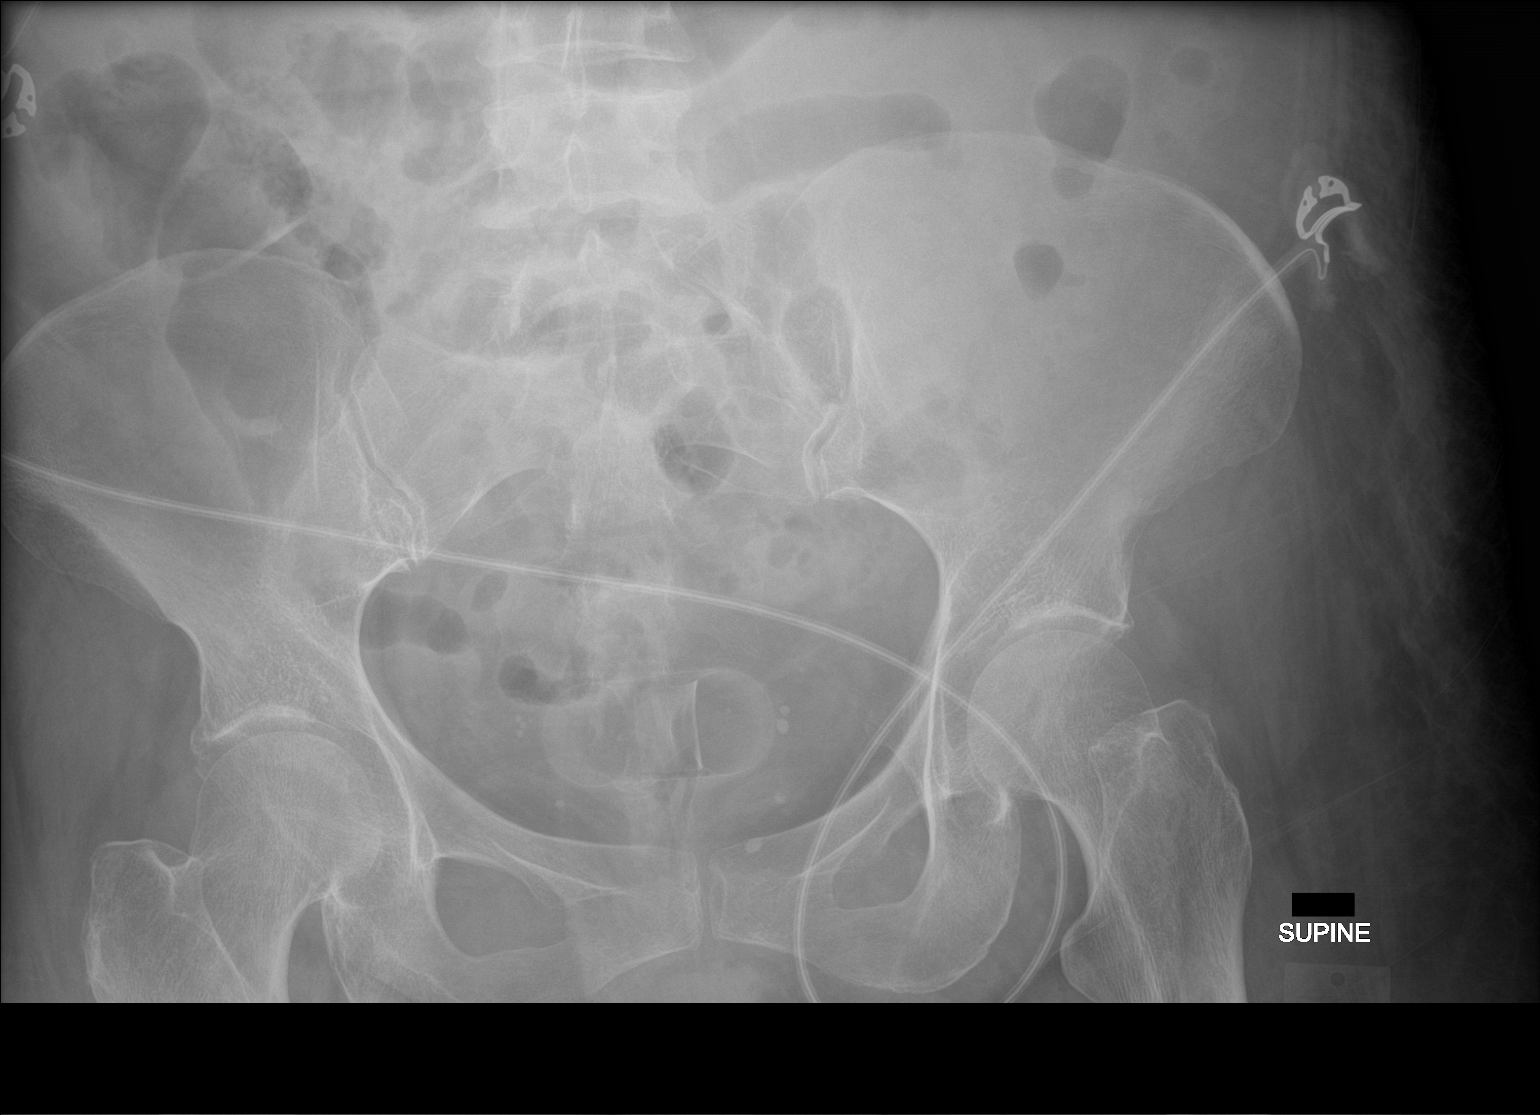

[abdomen kub (2 of 3)]
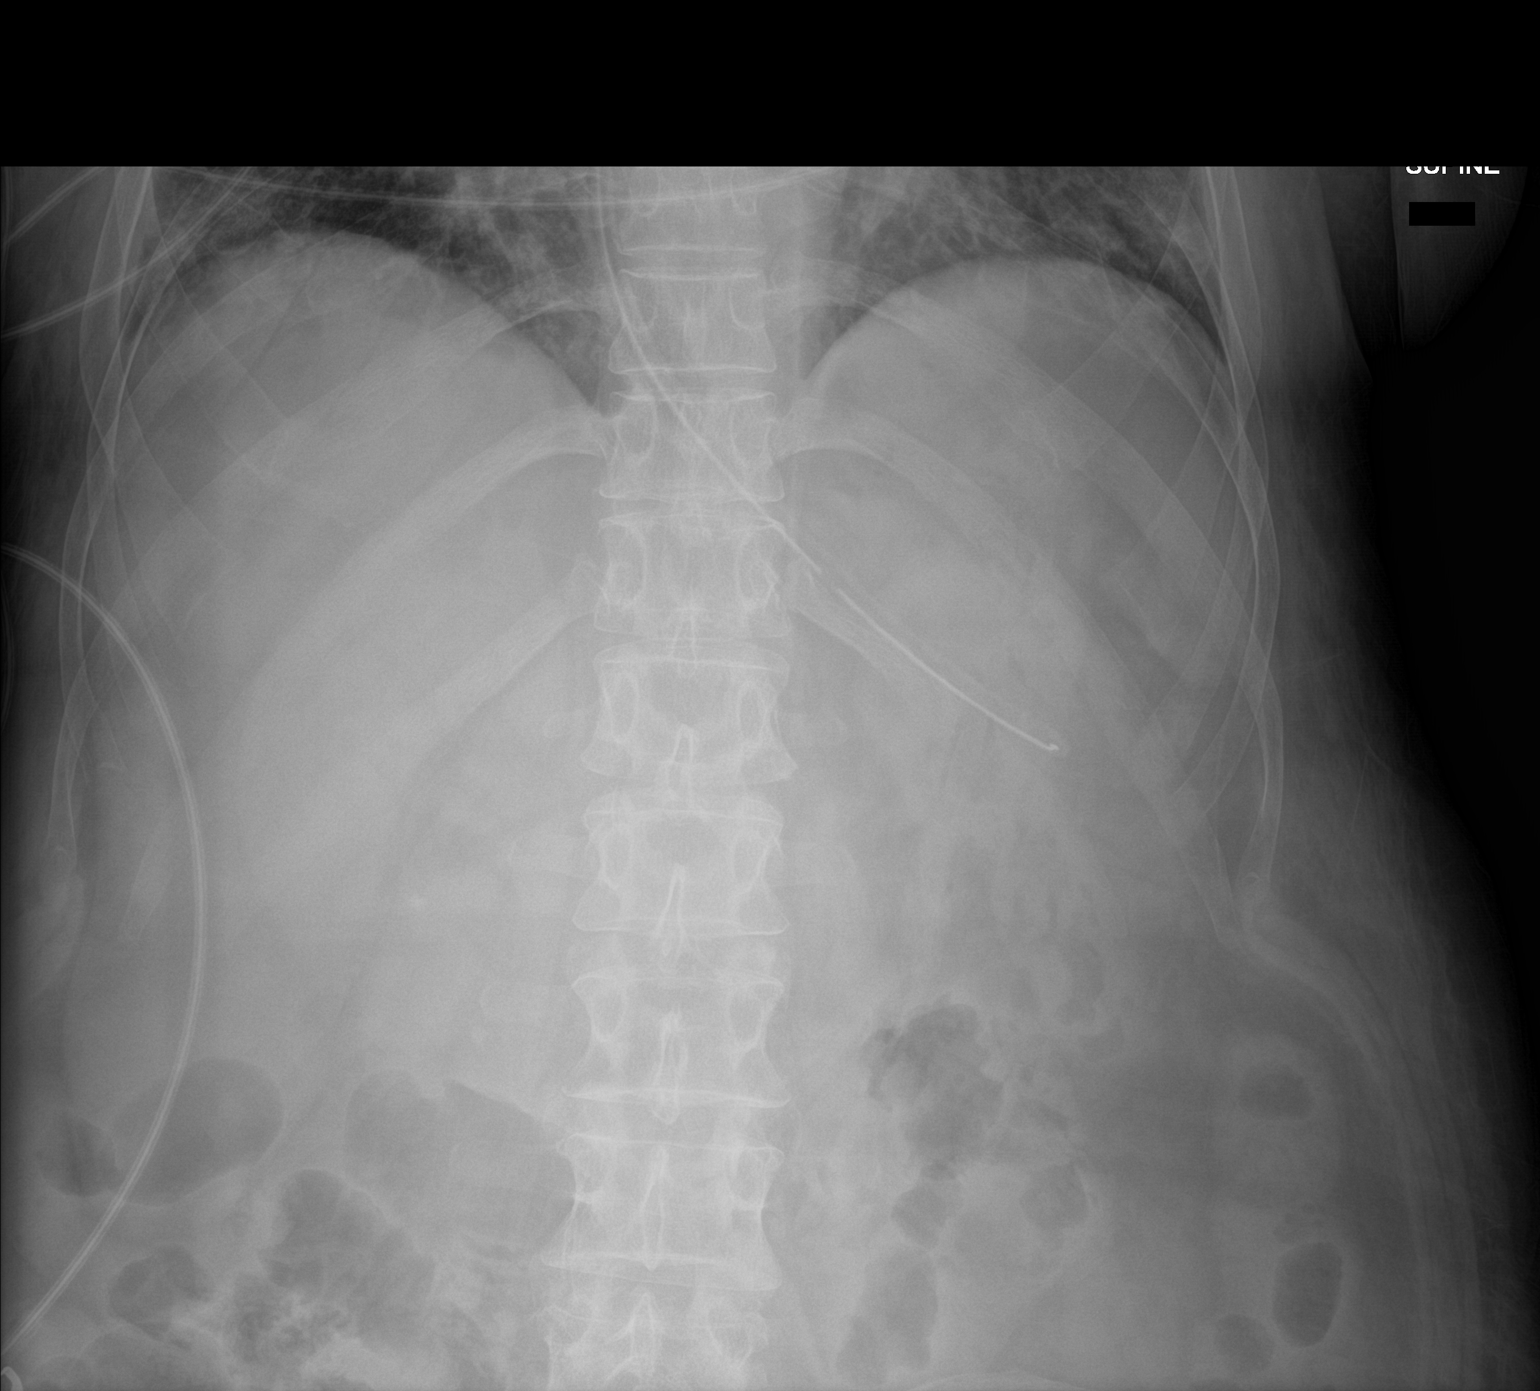

[abdomen kub (3 of 3)]
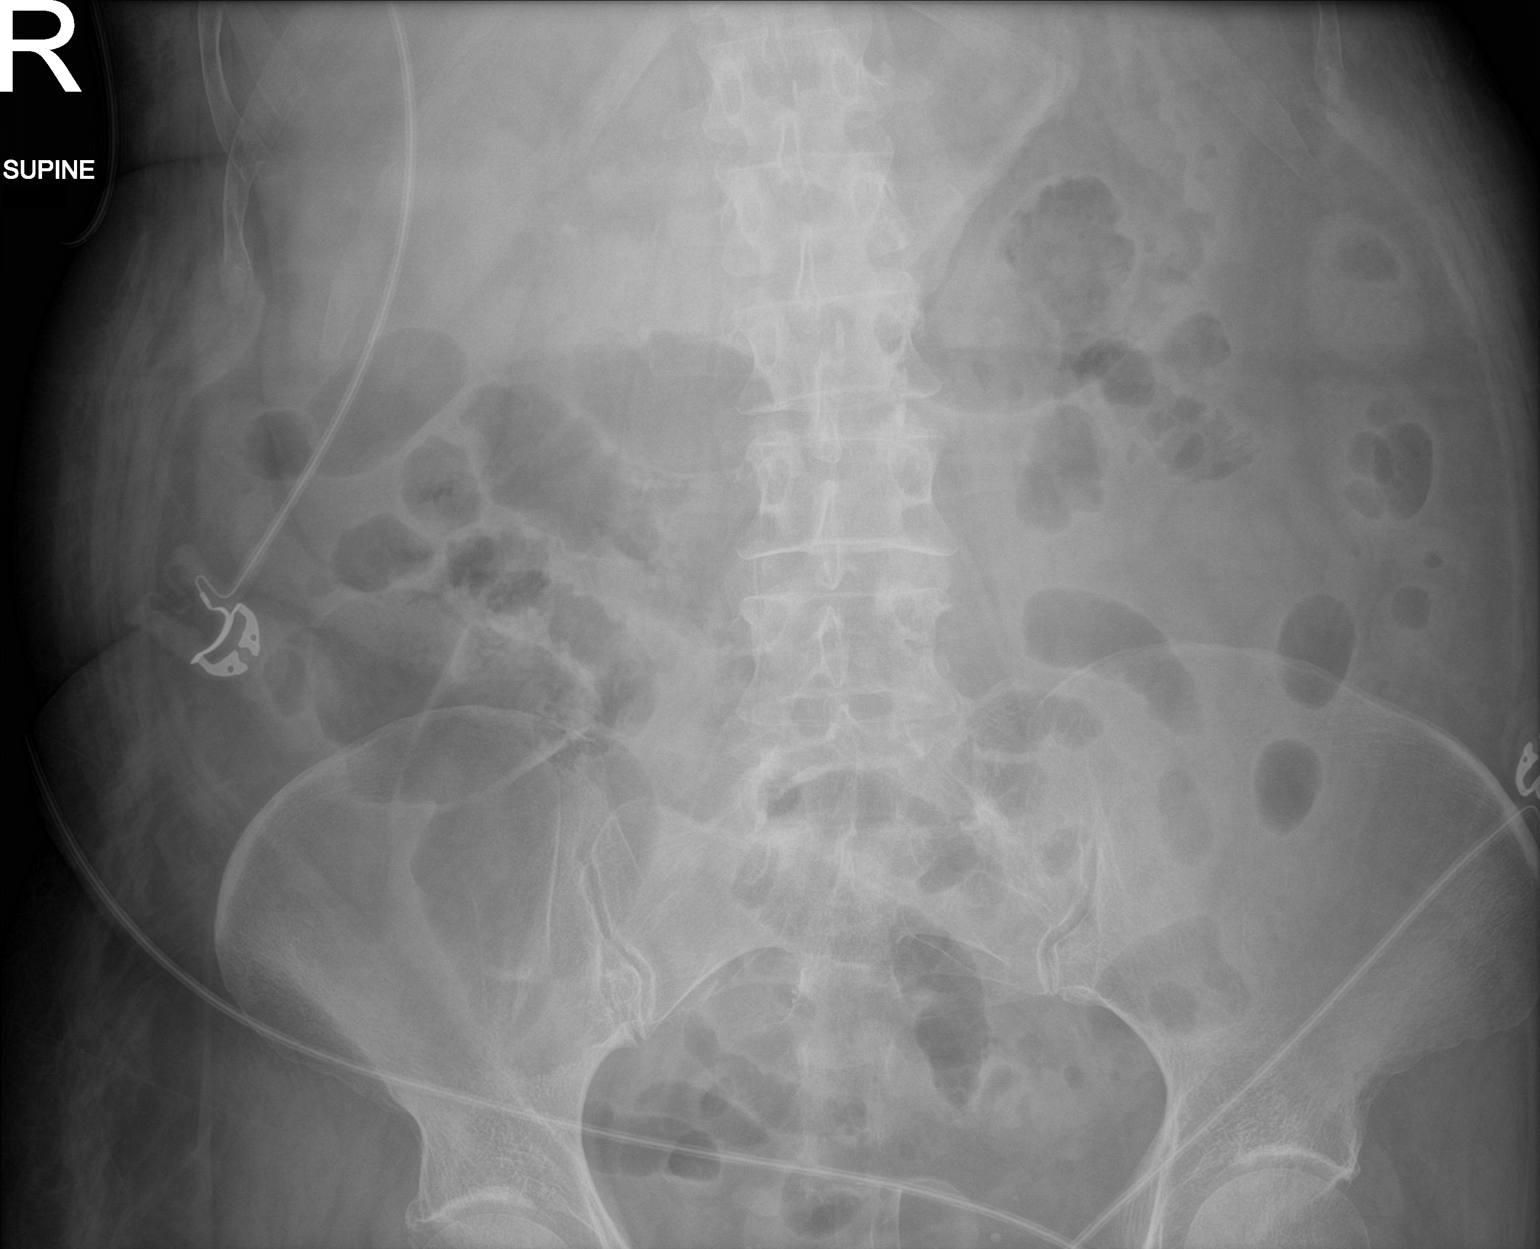

[3 of 3 positions shown; findings below may reference images not displayed]

FINDINGS: Supine views of the abdomen show knee NG tube present with the tip
in the proximal body the stomach. No bowel obstruction is seen.
Faint calcifications are present in the right upper quadrant local
which could represent gallstones although right renal calculi cannot
be excluded. There is a circular opacity within the mid pelvis which
most likely is due to prior surgery, possibly an atypical pessary.
Correlate clinically and surgically.
IMPRESSION: 1. No bowel obstruction.  NG tube tip in proximal body of stomach.
2. Cannot exclude small gallstones and/or right renal calculi.
3. Unusual rounded opacity in the pelvis. Possible atypical pessary.
Correlate clinically.

## 2018-12-22 IMAGING — DX DG CHEST 1V PORT
1 series · 1 of 1 positions shown · non-contrast
Comparison: 09/03/2017

CLINICAL DATA: Respiratory failure

EXAM:
PORTABLE CHEST 1 VIEW

[chest ap]
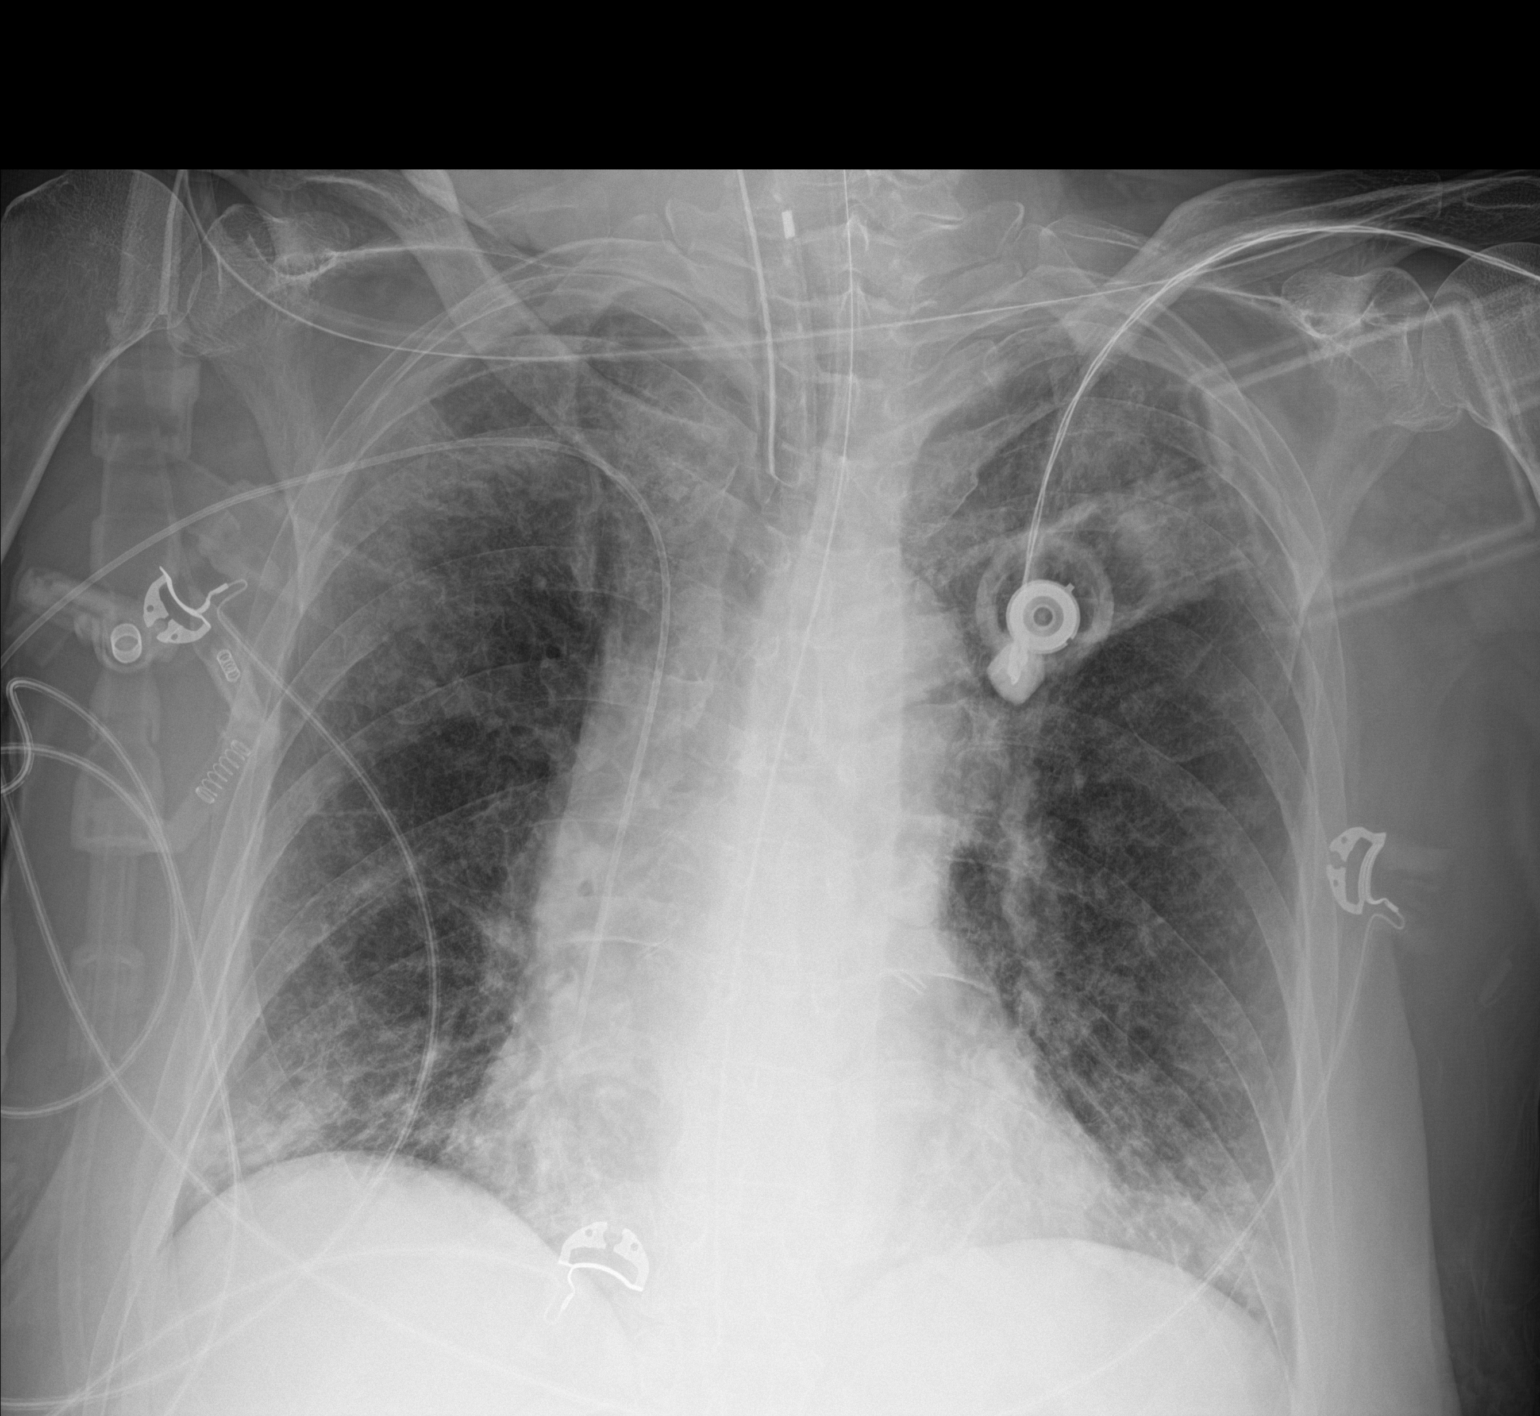

[1 of 1 positions shown; findings below may reference images not displayed]

FINDINGS: Support devices are stable. Bibasilar atelectasis and diffuse
interstitial and alveolar opacities, slightly improved since prior
study. Heart is borderline in size.
IMPRESSION: Improving bilateral airspace opacities.

## 2018-12-22 IMAGING — CT CT HEAD W/O CM
3 of 4 series · 15 of 47 positions shown, 18 images · non-contrast
Comparison: None.

CLINICAL DATA: Altered mental status.

EXAM:
CT HEAD WITHOUT CONTRAST
TECHNIQUE: Contiguous axial images were obtained from the base of the skull
through the vertex without intravenous contrast.

[Series 2: head wo · axial · 0.39mm/px · z∈[+438,+558]mm · 9 of 29 slices shown, 12 images]
[im 3/29  brain]
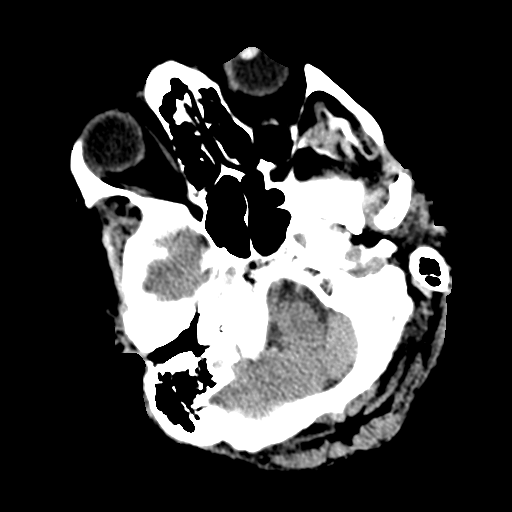
[im 3/29  bone]
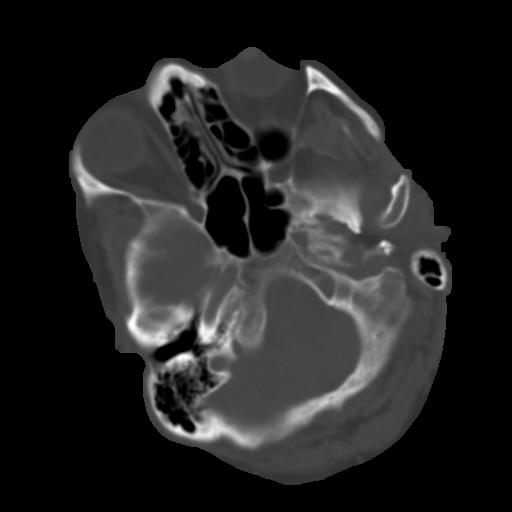
[im 7/29  brain]
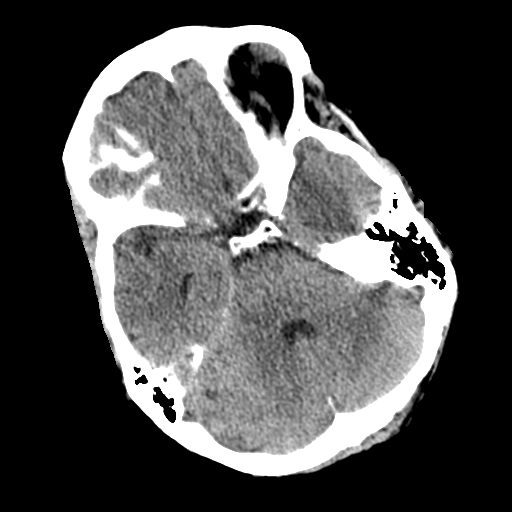
[im 9/29  brain]
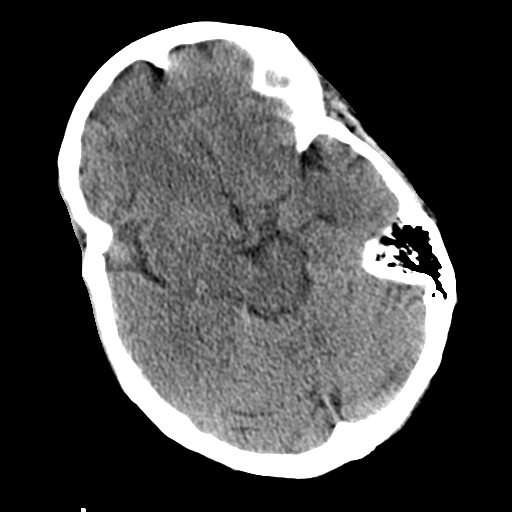
[im 13/29  brain]
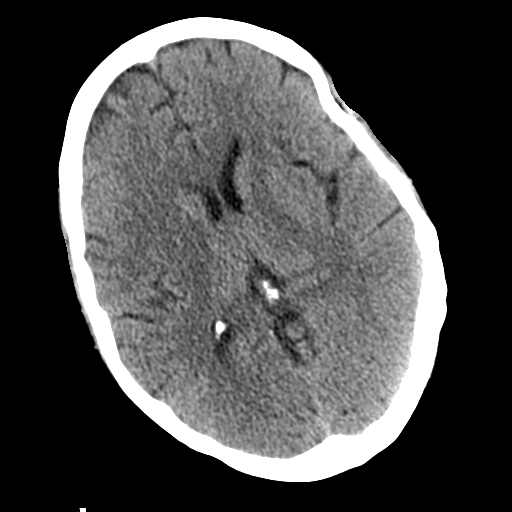
[im 15/29  brain]
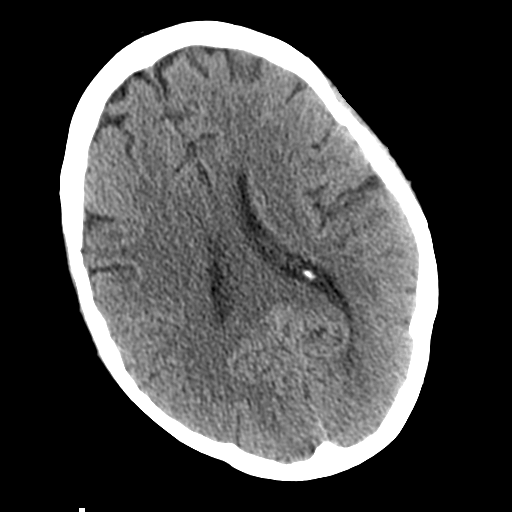
[im 15/29  bone]
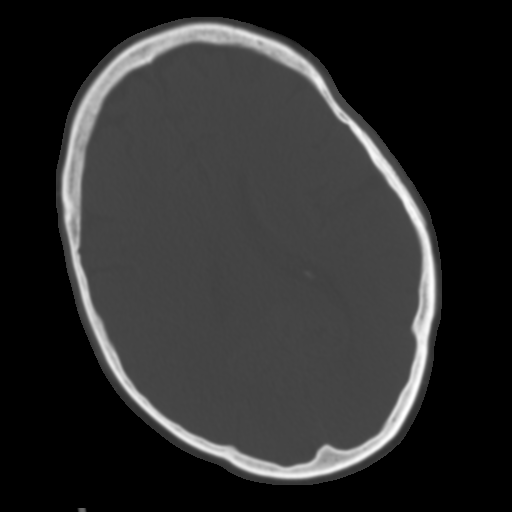
[im 17/29  brain]
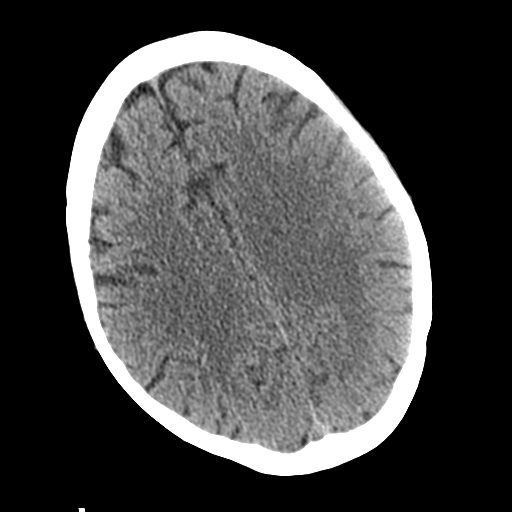
[im 21/29  brain]
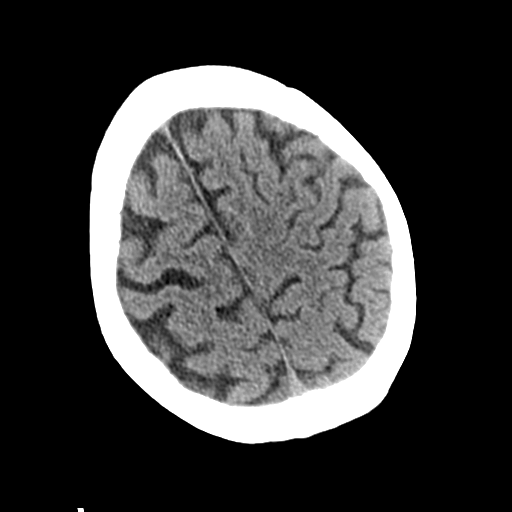
[im 23/29  brain]
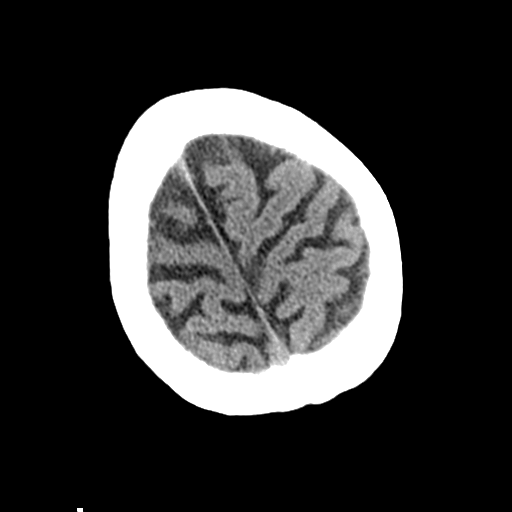
[im 27/29  brain]
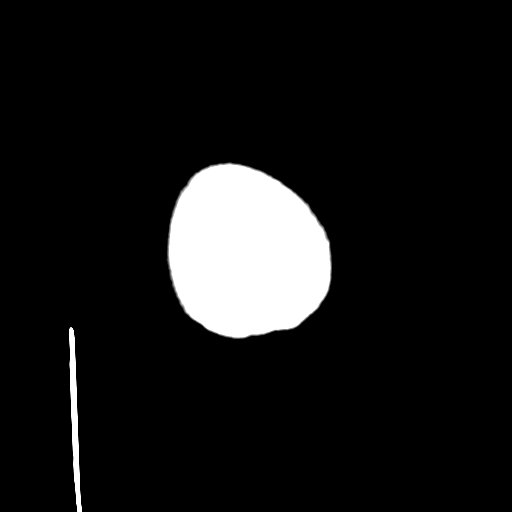
[im 27/29  bone]
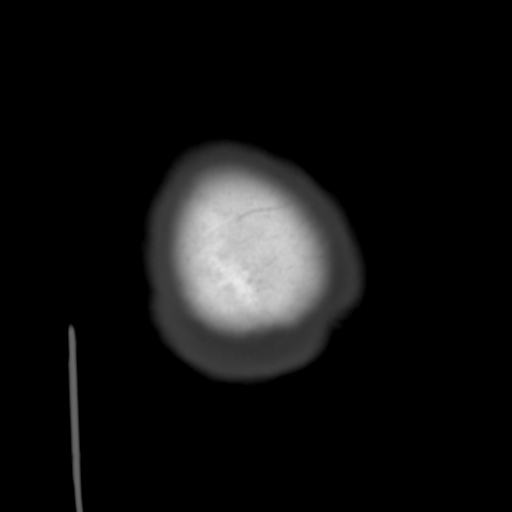

[Series 6: coronal soft tissue · coronal · 0.28mm/px · 3 of 63 slices shown]
[im 21/63  brain]
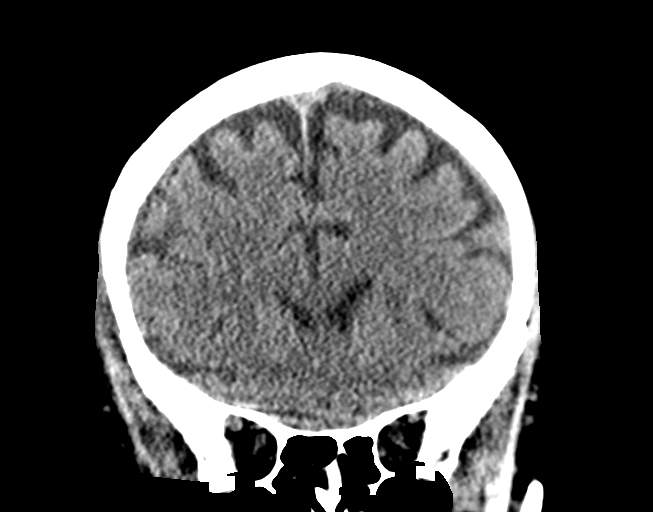
[im 28/63  brain]
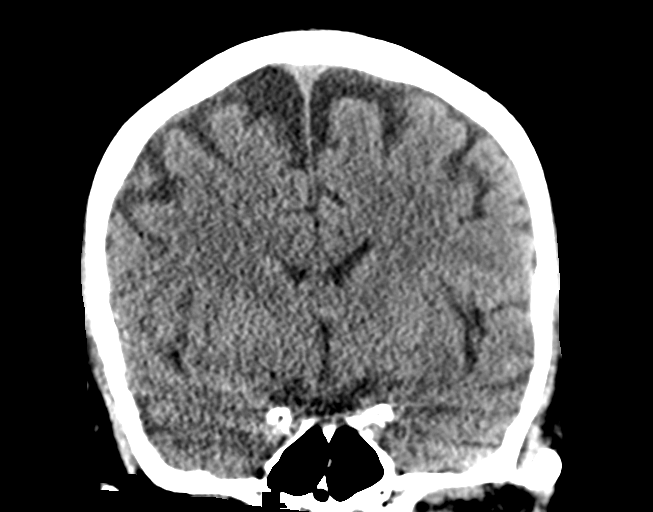
[im 35/63  brain]
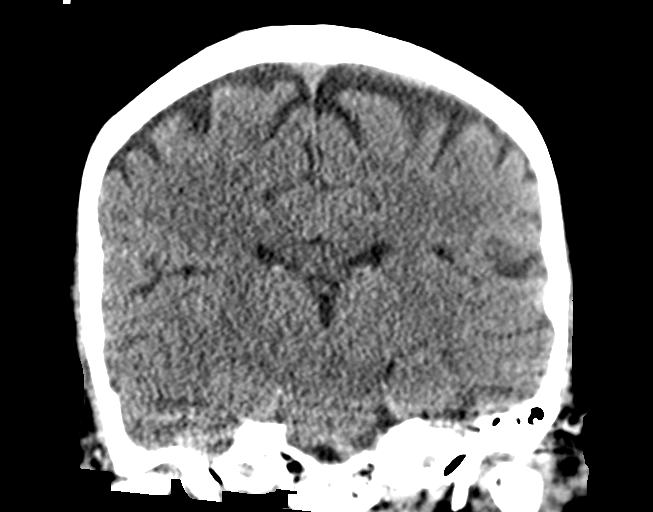

[Series 7: sagittal soft tissue · sagittal · 0.30mm/px · 3 of 49 slices shown]
[im 17/49  brain]
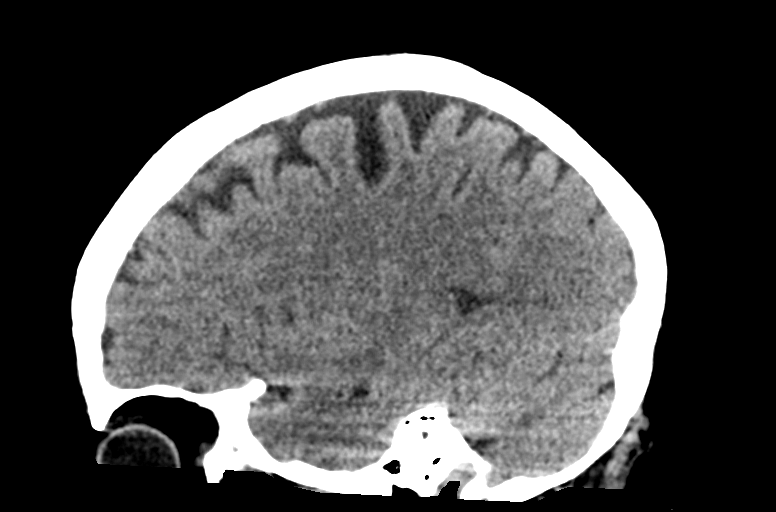
[im 25/49  brain]
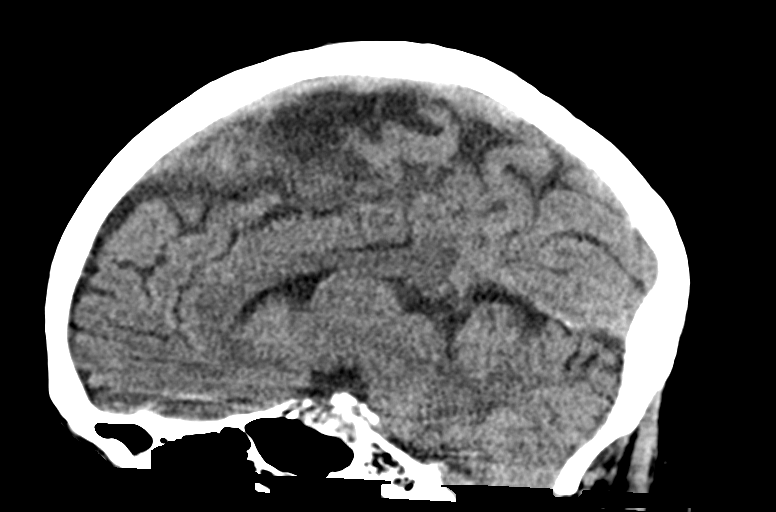
[im 33/49  brain]
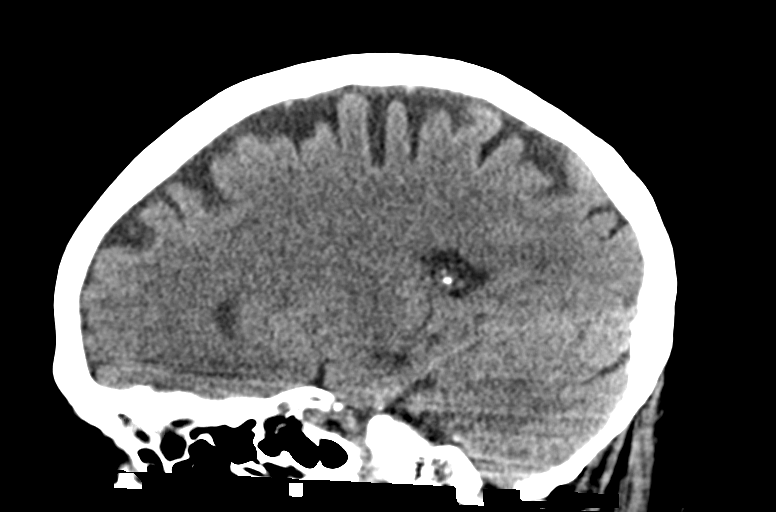

[15 of 47 positions shown; findings below may reference images not displayed]

FINDINGS: Brain: No evidence of acute infarction, hemorrhage, hydrocephalus,
extra-axial collection or mass lesion/mass effect.

Vascular: No hyperdense vessel or unexpected calcification.

Skull: Normal. Negative for fracture or focal lesion.

Sinuses/Orbits: No acute finding.

Other: None.
IMPRESSION: Normal head CT.

## 2018-12-23 IMAGING — MR MR HEAD W/O CM
9 of 10 series · 40 of 48 positions shown · non-contrast
Comparison: Prior CT from 09/04/2017.

CLINICAL DATA: Initial evaluation for acute altered mental status,
unresponsiveness.

EXAM:
MRI HEAD WITHOUT CONTRAST
TECHNIQUE: Multiplanar, multiecho pulse sequences of the brain and surrounding
structures were obtained without intravenous contrast.

[Series 3: T1 · sagittal · 5.0mm · 0.45mm/px · 3 of 25 slices shown]
[im 1/25]
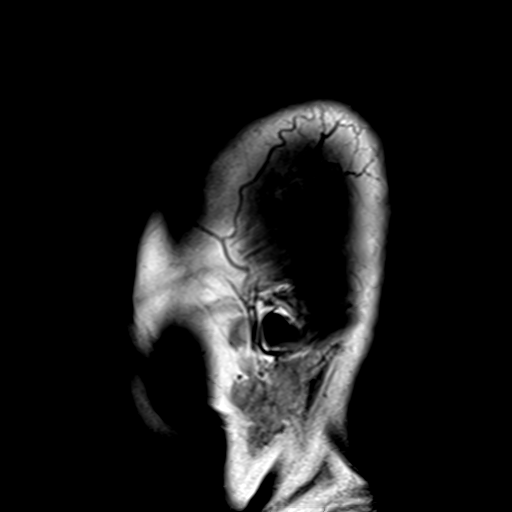
[im 13/25]
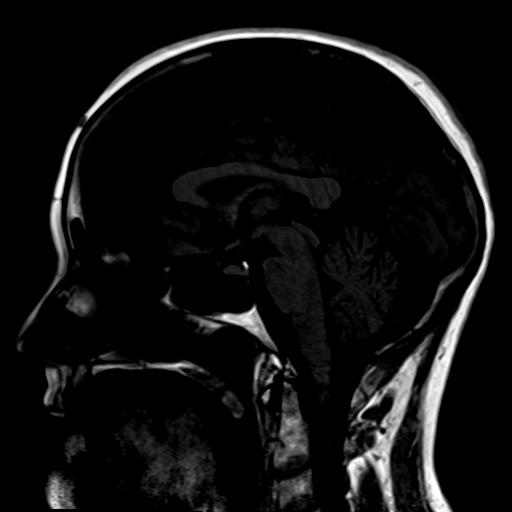
[im 25/25]
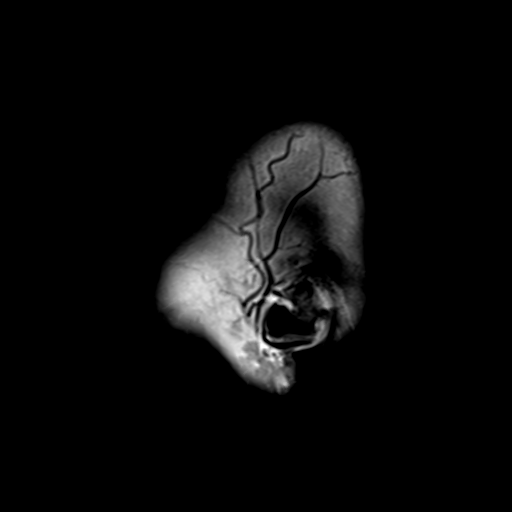

[Series 5: DWI · axial · 3.0mm · 1.80mm/px · z∈[-21,+140]mm · 7 of 53 slices shown (1 of 2)]
[im 1/53]
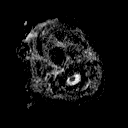
[im 9/53]
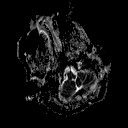
[im 18/53]
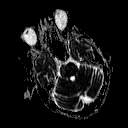
[im 27/53]
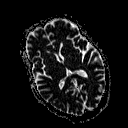
[im 35/53]
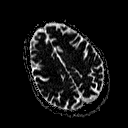
[im 44/53]
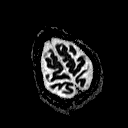
[im 53/53]
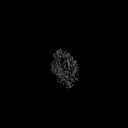

[Series 7: DWI · coronal · 3.0mm · 1.80mm/px · 6 of 45 slices shown (2 of 2)]
[im 1/45]
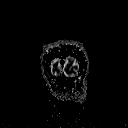
[im 9/45]
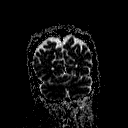
[im 18/45]
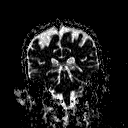
[im 27/45]
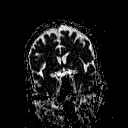
[im 36/45]
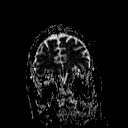
[im 45/45]
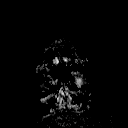

[Series 9: T2 · axial · 5.0mm · 0.45mm/px · z∈[-29,+126]mm · 3 of 25 slices shown (1 of 3)]
[im 1/25]
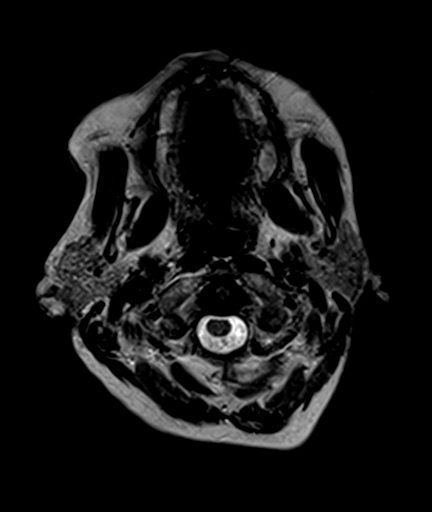
[im 13/25]
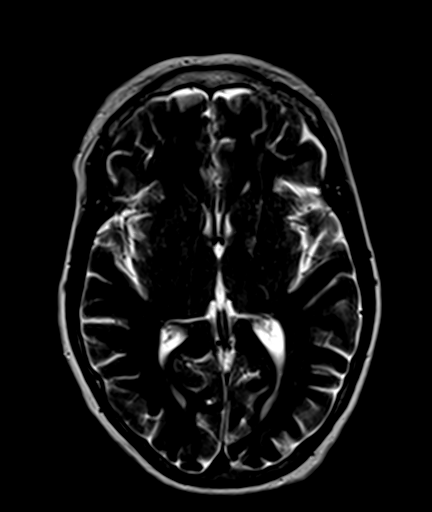
[im 25/25]
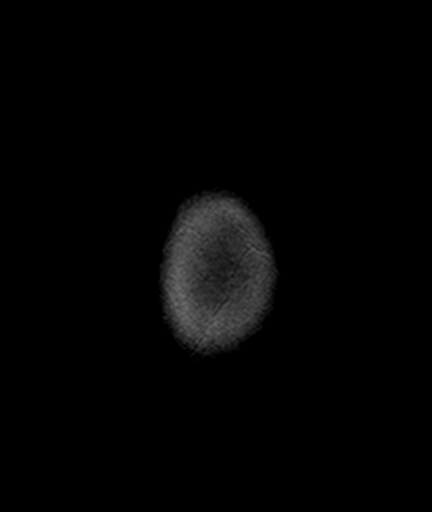

[Series 10: FLAIR · axial · 3.0mm · 0.45mm/px · z∈[-28,+127]mm · 7 of 53 slices shown]
[im 1/53]
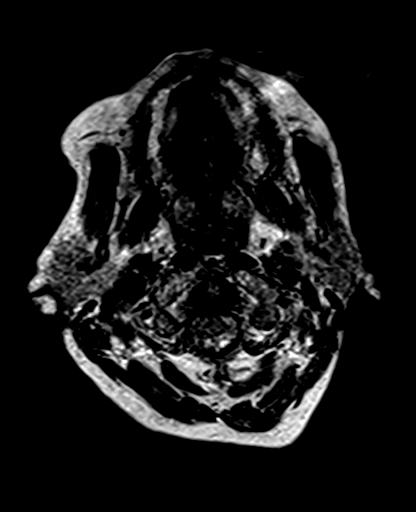
[im 9/53]
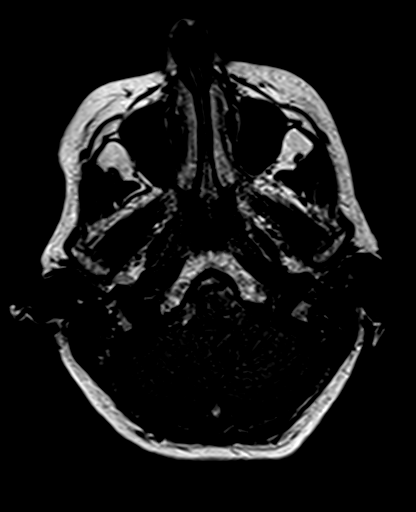
[im 18/53]
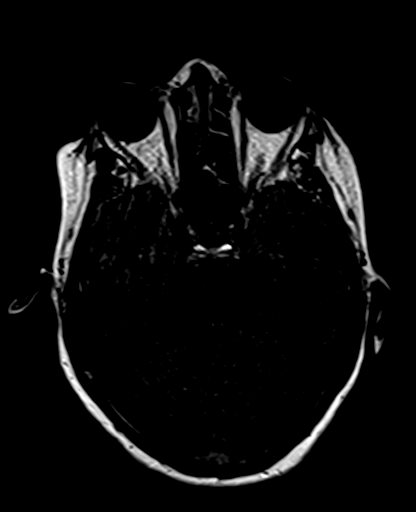
[im 27/53]
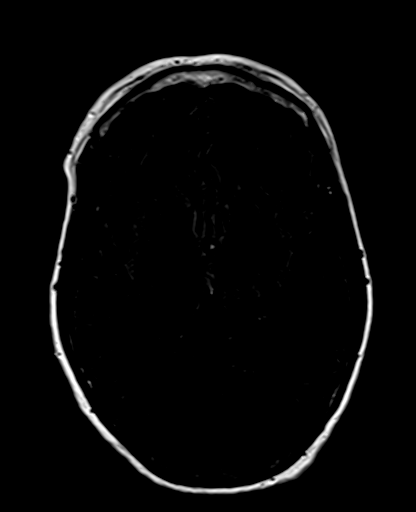
[im 35/53]
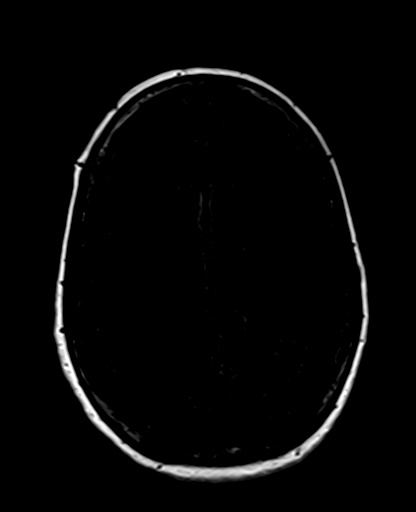
[im 44/53]
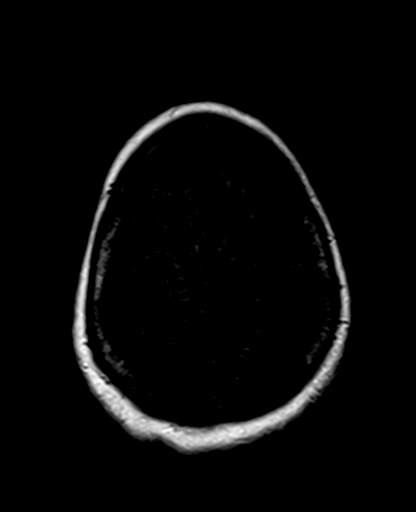
[im 53/53]
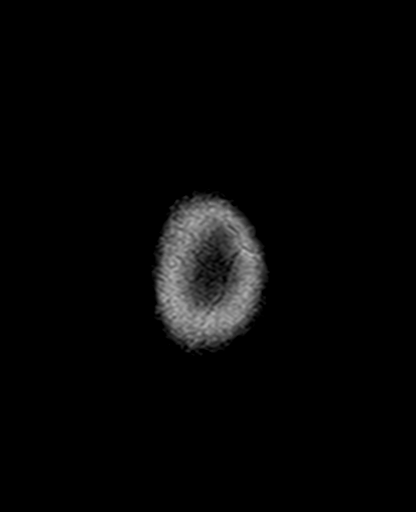

[Series 11: T2 · axial · 5.0mm · 1.20mm/px · z∈[-31,+124]mm · 3 of 25 slices shown (2 of 3)]
[im 1/25]
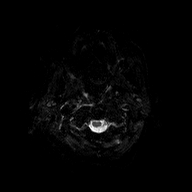
[im 13/25]
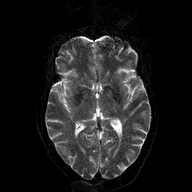
[im 25/25]
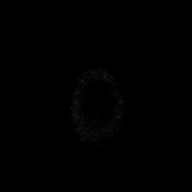

[Series 12: GRE · axial · 5.0mm · 0.45mm/px · z∈[-31,+124]mm · 3 of 25 slices shown]
[im 1/25]
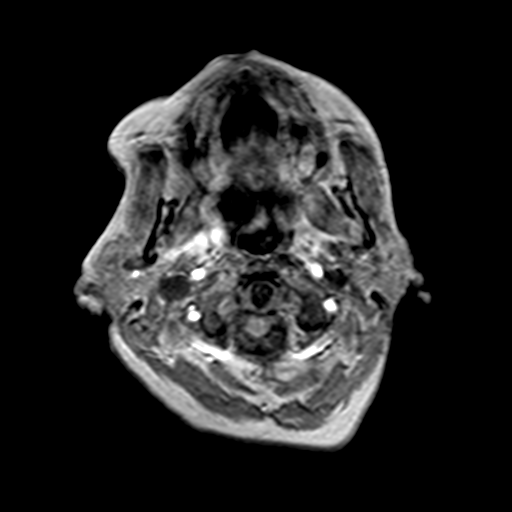
[im 13/25]
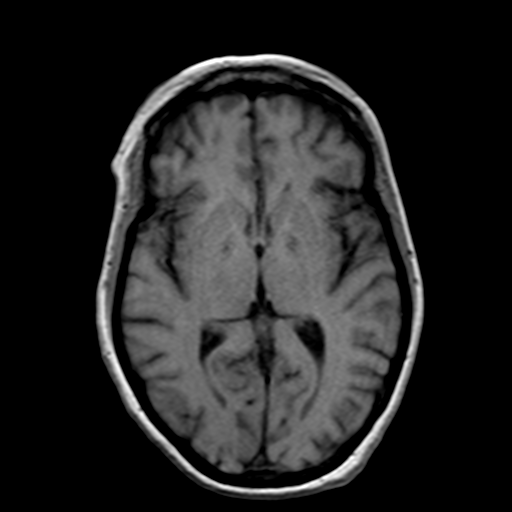
[im 25/25]
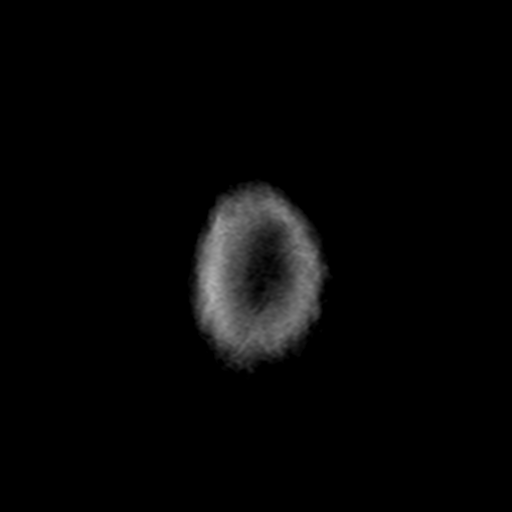

[Series 13: T2 · coronal · 5.0mm · 0.45mm/px · 3 of 27 slices shown (3 of 3)]
[im 1/27]
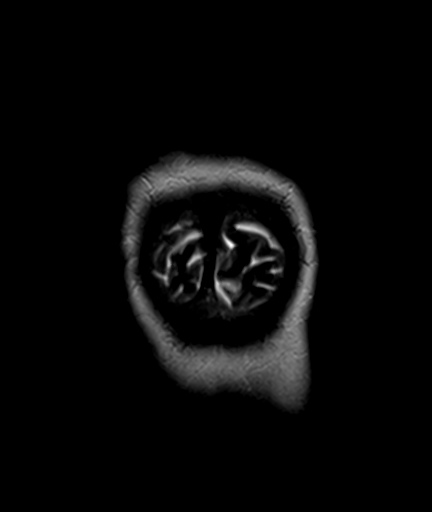
[im 14/27]
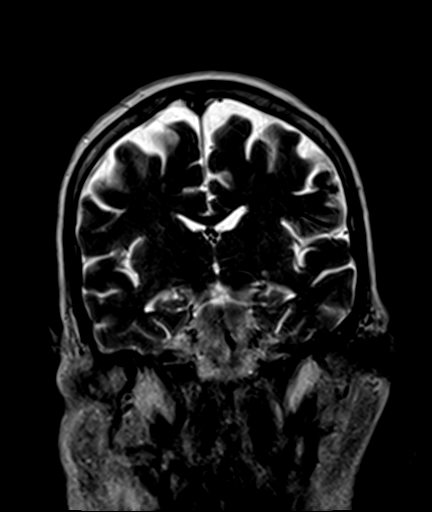
[im 27/27]
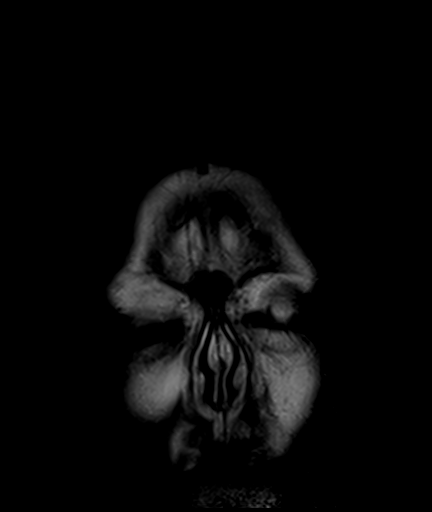

[Series 100: (id) ax · axial · 3.0mm · 1.80mm/px · z∈[-21,+86]mm · 5 of 55 slices shown]
[im 1/55]
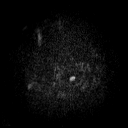
[im 10/55]
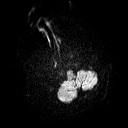
[im 19/55]
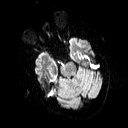
[im 28/55]
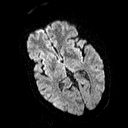
[im 37/55]
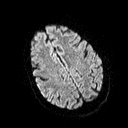

[40 of 48 positions shown; findings below may reference images not displayed]

FINDINGS: Brain: Study degraded by motion.

Cerebral volume within normal limits for age. No significant
cerebral white matter changes identified. No abnormal foci of
restricted diffusion to suggest acute or subacute ischemia.
Gray-white matter differentiation maintained. Tiny remote lacunar
infarct noted within the left thalamus (series 10, image 26). No
other evidence for chronic infarction. No susceptibility artifact to
suggest acute or chronic intracranial hemorrhage.

No mass lesion, midline shift or mass effect. No hydrocephalus. No
extra-axial fluid collection. Major dural sinuses are patent.

Pituitary gland suprasellar region normal. Midline structures intact
and normal.

Vascular: Major intracranial vascular flow voids well maintained.

Skull and upper cervical spine: Craniocervical junction normal. Mild
degenerate spondylolysis noted within the upper cervical spine
without stenosis. Bone marrow signal intensity normal. No scalp soft
tissue abnormality.

Sinuses/Orbits: Globes and orbital soft tissues within normal
limits. Paranasal sinuses clear. Small bilateral mastoid effusions,
of doubtful significance. Inner ear structures normal.

Other: None.
IMPRESSION: 1. No acute intracranial abnormality.
2. Tiny remote left thalamic lacunar infarct.
3. Otherwise normal brain MRI.

## 2018-12-24 IMAGING — DX DG CHEST 1V PORT
2 series · 2 of 2 positions shown · non-contrast
Comparison: 09/04/2017.

CLINICAL DATA: Respiratory failure.

EXAM:
PORTABLE CHEST 1 VIEW

[chest ap (1 of 2)]
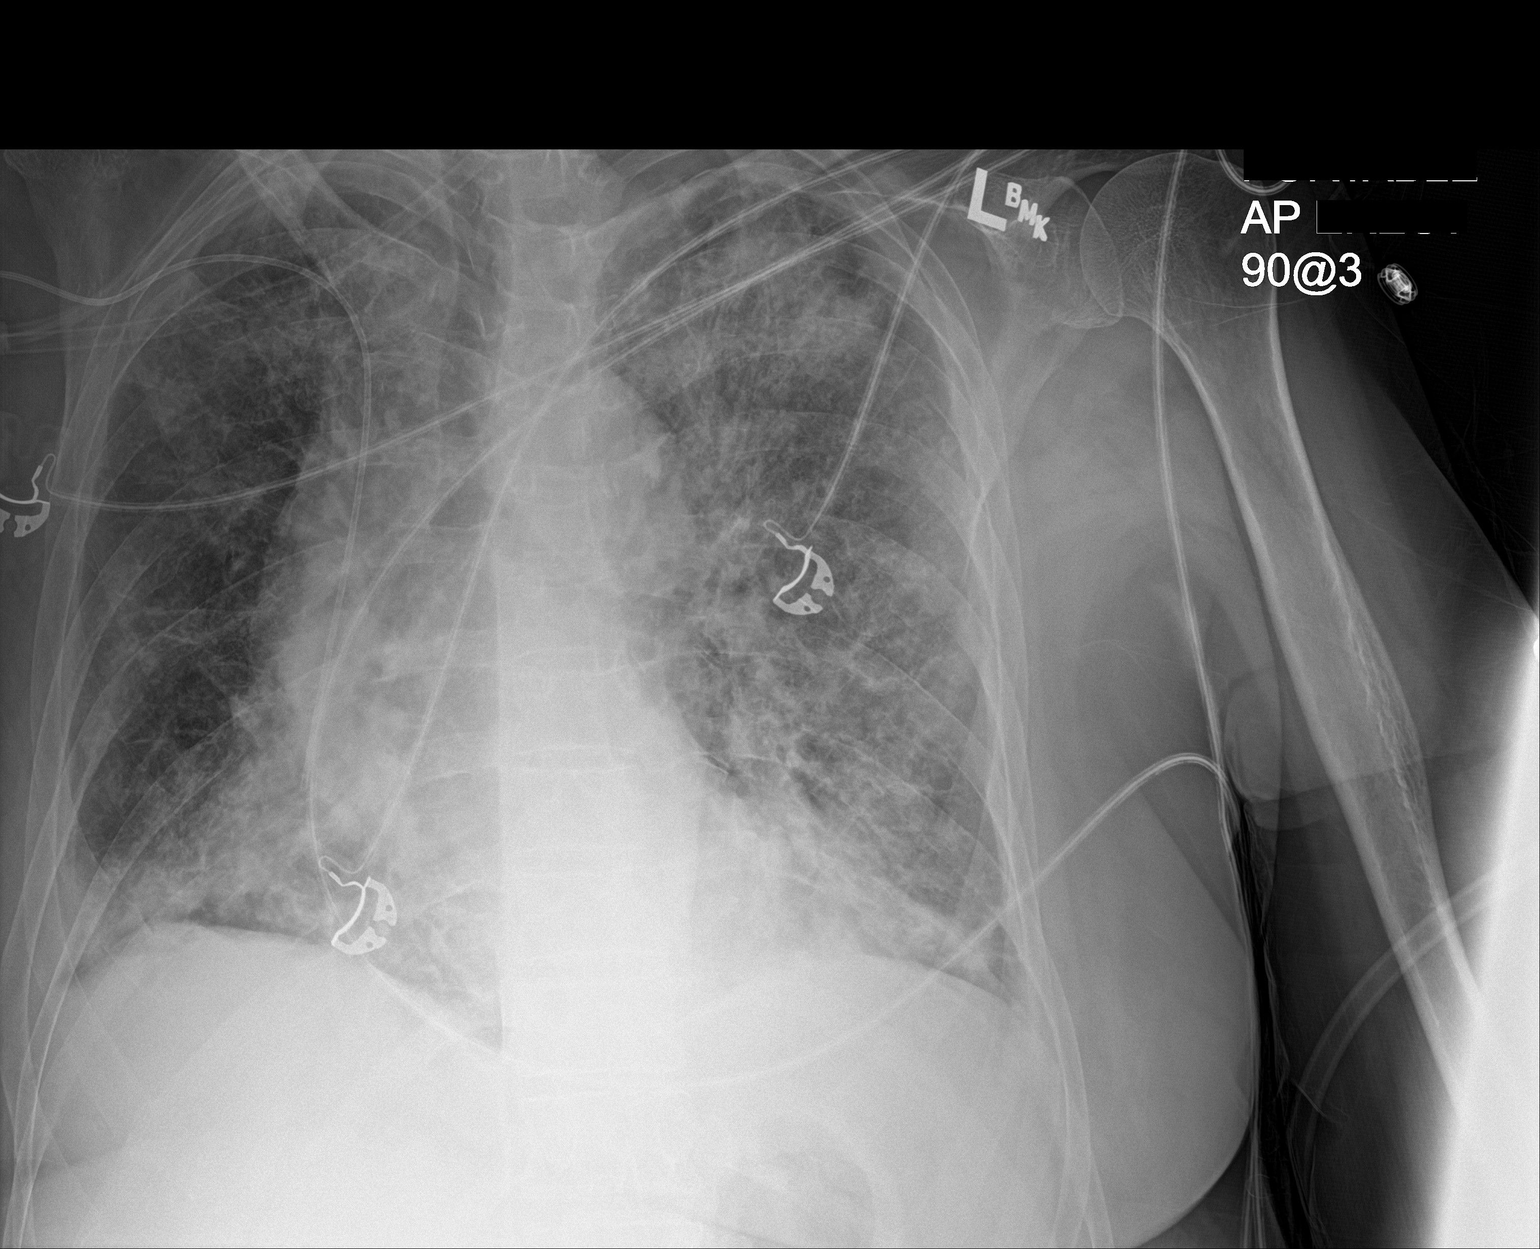

[chest ap (2 of 2)]
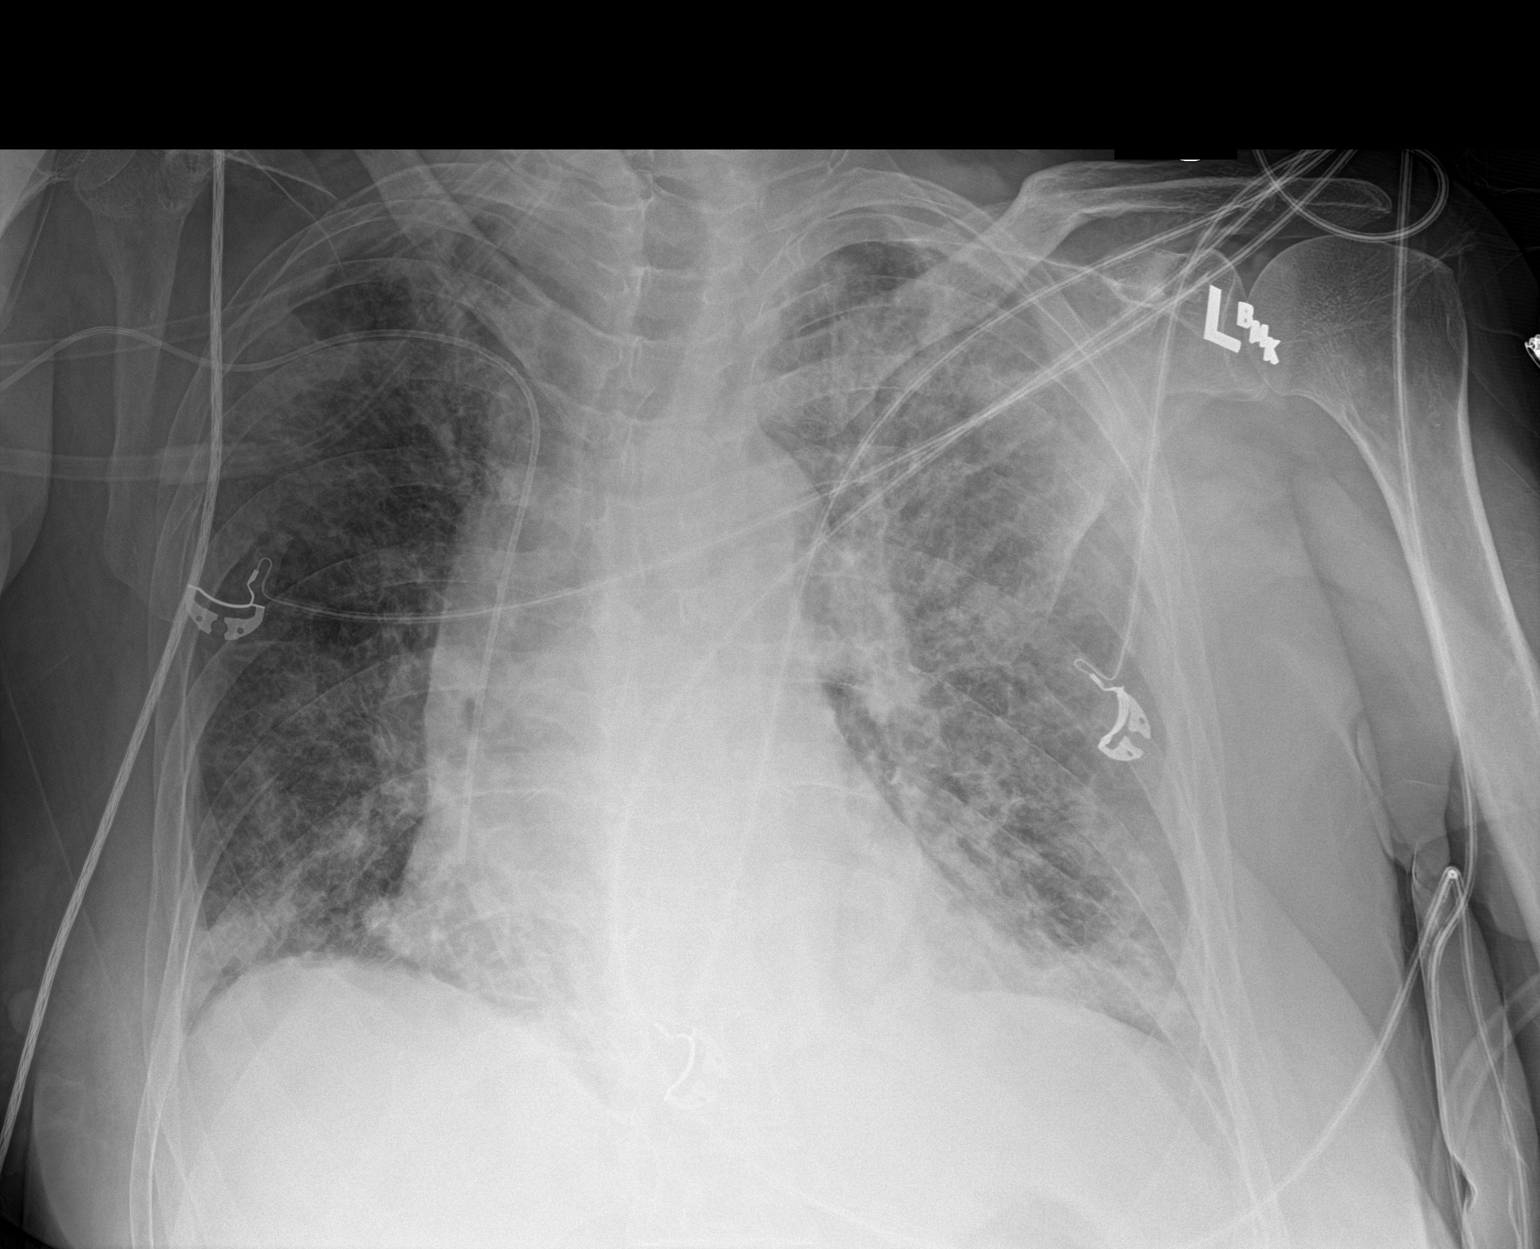

[2 of 2 positions shown; findings below may reference images not displayed]

FINDINGS: Interim extubation and removal of NG tube. Right PICC line noted
with tip over right atrium. Cardiomegaly. Diffuse progressive
bilateral from interstitial prominence noted most consistent
interstitial edema. Bilateral pneumonia cannot be excluded. Small
bilateral pleural effusions. Biapical pleural thickening consistent
scarring again noted. No pneumothorax.
IMPRESSION: 1. Interim extubation and removal of NG tube. Right PICC line in
stable position.

2. Cardiomegaly with diffuse bilateral pulmonary infiltrates,
progressed from prior exam. Small bilateral pleural effusions.
Findings most consistent with congestive heart failure. Bilateral
pneumonia cannot be excluded.

## 2018-12-26 LAB — HEPATIC FUNCTION PANEL
ALT: 7 (ref 7–35)
AST: 9 — AB (ref 13–35)
Alkaline Phosphatase: 150 — AB (ref 25–125)
BILIRUBIN, TOTAL: 0.3

## 2018-12-26 LAB — BASIC METABOLIC PANEL
BUN: 16 (ref 4–21)
Creatinine: 1.2 — AB (ref 0.5–1.1)
Glucose: 99
POTASSIUM: 3.1 — AB (ref 3.4–5.3)
SODIUM: 143 (ref 137–147)

## 2018-12-26 LAB — TSH: TSH: 1.89 (ref 0.41–5.90)

## 2018-12-26 LAB — LIPID PANEL
CHOLESTEROL: 137 (ref 0–200)
HDL: 49 (ref 35–70)
LDL CALC: 69
TRIGLYCERIDES: 96 (ref 40–160)

## 2019-01-01 ENCOUNTER — Encounter: Payer: Self-pay | Admitting: Neurology

## 2019-01-13 ENCOUNTER — Telehealth: Payer: Self-pay

## 2019-01-13 ENCOUNTER — Encounter: Payer: Self-pay | Admitting: Family Medicine

## 2019-01-13 ENCOUNTER — Ambulatory Visit (INDEPENDENT_AMBULATORY_CARE_PROVIDER_SITE_OTHER): Payer: Medicare HMO | Admitting: Family Medicine

## 2019-01-13 VITALS — BP 126/82 | HR 78 | Temp 98.1°F | Ht 70.5 in | Wt 229.0 lb

## 2019-01-13 DIAGNOSIS — I509 Heart failure, unspecified: Secondary | ICD-10-CM | POA: Insufficient documentation

## 2019-01-13 DIAGNOSIS — N183 Chronic kidney disease, stage 3 unspecified: Secondary | ICD-10-CM | POA: Insufficient documentation

## 2019-01-13 DIAGNOSIS — R42 Dizziness and giddiness: Secondary | ICD-10-CM | POA: Diagnosis not present

## 2019-01-13 DIAGNOSIS — E876 Hypokalemia: Secondary | ICD-10-CM | POA: Diagnosis not present

## 2019-01-13 LAB — BASIC METABOLIC PANEL
BUN: 13 mg/dL (ref 6–23)
CO2: 36 mEq/L — ABNORMAL HIGH (ref 19–32)
CREATININE: 1.18 mg/dL (ref 0.40–1.20)
Calcium: 9.1 mg/dL (ref 8.4–10.5)
Chloride: 96 mEq/L (ref 96–112)
GFR: 45.89 mL/min — ABNORMAL LOW (ref >60.00)
Glucose, Bld: 99 mg/dL (ref 70–99)
Potassium: 3.4 mEq/L — ABNORMAL LOW (ref 3.5–5.1)
Sodium: 141 mEq/L (ref 135–145)

## 2019-01-13 NOTE — Patient Instructions (Addendum)
Go home and check the bottle for potassium.   Call back the clinic to let us know what dose you are on.   We will check potassium today  If still low - will plan to add additional dose (3 pills per day) and return for labs next week.   Make slow movements Vertigo  Vertigo means that you feel like you are moving when you are not. Vertigo can also make you feel like things around you are moving when they are not. This feeling can come and go at any time. Vertigo often goes away on its own. Follow these instructions at home:  Avoid making fast movements.  Avoid driving.  Avoid using heavy machinery.  Avoid doing any task or activity that might cause danger to you or other people if you would have a vertigo attack while you are doing it.  Sit down right away if you feel dizzy or have trouble with your balance.  Take over-the-counter and prescription medicines only as told by your doctor.  Follow instructions from your doctor about which positions or movements you should avoid.  Drink enough fluid to keep your pee (urine) clear or pale yellow.  Keep all follow-up visits as told by your doctor. This is important. Contact a doctor if:  Medicine does not help your vertigo.  You have a fever.  Your problems get worse or you have new symptoms.  Your family or friends see changes in your behavior.  You feel sick to your stomach (nauseous) or you throw up (vomit).  You have a "pins and needles" feeling or you are numb in part of your body. Get help right away if:  You have trouble moving or talking.  You are always dizzy.  You pass out (faint).  You get very bad headaches.  You feel weak or have trouble using your hands, arms, or legs.  You have changes in your hearing.  You have changes in your seeing (vision).  You get a stiff neck.  Bright light starts to bother you. This information is not intended to replace advice given to you by your health care provider.  Make sure you discuss any questions you have with your health care provider. Document Released: 09/11/2008 Document Revised: 05/10/2016 Document Reviewed: 03/28/2015 Elsevier Interactive Patient Education  [employer].

## 2019-01-13 NOTE — Assessment & Plan Note (Signed)
Repeat labs. Plan to increase dose if still low.

## 2019-01-13 NOTE — Progress Notes (Signed)
Subjective:     Morgan [name] is a 68 y.o. female presenting for Discuss lab results (done by Dr. Manuella Ghazi. patient was advised to follow up with PCP on this.)     HPI   Labs ordered by her Neurologist  K 3.1 Cr 1.2 - stable and baseline Alk Phos 150 HCO2 - 33.0  Cannot remember dose of potassium 20 meq - takes 2 pills per day  CHF - taking lasix for the last year - Se[facility] cardiology at [facility]  #Dizziness - occurs when walking - does not happen when laying in bed - feels like the room is spinning - does not feel like she will pass out - occasionally happens when she goes from sitting to standing - symptoms since her last stroke - happens every few days - lasts for a few seconds - has to sit and wait for it to catch up  Has ENT appointment in a few weeks  Review of Systems  Constitutional: Negative for chills and fever.  Respiratory: Negative for shortness of breath.   Cardiovascular: Negative for chest pain and palpitations.  Gastrointestinal: Negative for abdominal pain, nausea and vomiting.  Neurological: Positive for dizziness. Negative for light-headedness.     Social History   Tobacco Use  Smoking Status Former Smoker  . Packs/day: 1.00  . Years: 15.00  . Pack years: 15.00  . Types: Cigarettes  . Last attempt to quit: 09/12/2017  . Years since quitting: 1.3  Smokeless Tobacco Never Used        Objective:    BP Readings from Last 3 Encounters:  01/13/19 126/82  11/12/18 120/80  10/16/18 (!) 143/90   Wt Readings from Last 3 Encounters:  01/13/19 229 lb (103.9 kg)  11/12/18 225 lb (102.1 kg)  10/16/18 228 lb (103.4 kg)    BP 126/82   Pulse 78   Temp 98.1 F (36.7 C)   Ht 5' 10.5" (1.791 m)   Wt 229 lb (103.9 kg)   SpO2 97%   BMI 32.39 kg/m    Physical Exam Constitutional:      General: She is not in acute distress.    Appearance: She is well-developed. She is not diaphoretic.  HENT:     Right Ear: External ear normal.     Left  Ear: External ear normal.     Nose: Nose normal.  Eyes:     Conjunctiva/sclera: Conjunctivae normal.  Neck:     Musculoskeletal: Neck supple.  Cardiovascular:     Rate and Rhythm: Normal rate and regular rhythm.     Heart sounds: No murmur.  Pulmonary:     Effort: Pulmonary effort is normal. No respiratory distress.     Breath sounds: Normal breath sounds. No wheezing or rales.  Musculoskeletal:     Comments: Using cane to ambulate. Trace LE edema  Skin:    General: Skin is warm and dry.     Capillary Refill: Capillary refill takes less than 2 seconds.  Neurological:     Mental Status: She is alert. Mental status is at baseline.  Psychiatric:        Mood and Affect: Mood normal.        Behavior: Behavior normal.     BMP Latest Ref Rng & Units 12/26/2018 11/12/2018 10/16/2018  Glucose 70 - 99 mg/dL - 116(H) 109(H)  BUN 4 - 21 16 25(H) 20  Creatinine 0.5 - 1.1 1.2(A) 1.27(H) 1.22(H)  Sodium 137 - 147 143 137 140  Potassium  3.4 - 5.3 3.1(A) 3.7 3.4(L)  Chloride 96 - 112 mEq/L - 94(L) 102  CO2 19 - 32 mEq/L - 31 29  Calcium 8.4 - 10.5 mg/dL - 9.7 8.9         Assessment & Plan:   Problem List Items Addressed This Visit      Cardiovascular and Mediastinum   CHF (congestive heart failure) ([place])    Appears euvolemic today. Potassium low and we do not have the current potassium supplement on our records. Repeat labs today and pt to call back. Last EF normal.         Genitourinary   CKD (chronic kidney disease) stage 3, GFR 30-59 ml/min ([facility])    Cr stable        Other   Hypokalemia - Primary    Repeat labs. Plan to increase dose if still low.       Relevant Orders   Basic metabolic panel   Dizziness    Symptoms unclear. It could be vertigo based on room spinning vs orthostatic given lasix. However, description more consistent with vertigo. Short lasting symptoms - hand out provided and pt already with plan to see ENT - recommended mention during that visit.             Return in about 4 weeks (around 02/10/2019) for annual with pap .  [name], MD

## 2019-01-13 NOTE — Addendum Note (Signed)
Addended by: [name] on: 01/13/2019 12:43 PM   Modules accepted: Orders

## 2019-01-13 NOTE — Assessment & Plan Note (Signed)
Cr stable 

## 2019-01-13 NOTE — Telephone Encounter (Signed)
Left message for patient to call back  

## 2019-01-13 NOTE — Telephone Encounter (Signed)
Updated in chart. Routing to MA as FYI - see result note for more information

## 2019-01-13 NOTE — Telephone Encounter (Signed)
Pt left v/m that she has Potassium 20 meq and takes two tabs daily.pt has only restarted taking Potassium for the last 2 weeks. Pt was seen earlier this morning and was to cb with Potassium dosage. [facility]. FYI to Dr Einar Pheasant.

## 2019-01-13 NOTE — Assessment & Plan Note (Signed)
Appears euvolemic today. Potassium low and we do not have the current potassium supplement on our records. Repeat labs today and pt to call back. Last EF normal.

## 2019-01-13 NOTE — Assessment & Plan Note (Signed)
Symptoms unclear. It could be vertigo based on room spinning vs orthostatic given lasix. However, description more consistent with vertigo. Short lasting symptoms - hand out provided and pt already with plan to see ENT - recommended mention during that visit.

## 2019-01-14 NOTE — Telephone Encounter (Signed)
Left detailed message on patient's phone-ok per DPR.

## 2019-01-14 NOTE — Telephone Encounter (Signed)
Pt returning your call

## 2019-01-21 ENCOUNTER — Other Ambulatory Visit: Payer: Self-pay | Admitting: Gastroenterology

## 2019-01-21 IMAGING — CT CT HEAD CODE STROKE
3 series · 15 of 45 positions shown, 18 images · non-contrast
Comparison: Brain MRI 09/05/2017

CLINICAL DATA: Code stroke.  Right-sided weakness

EXAM:
CT HEAD WITHOUT CONTRAST
TECHNIQUE: Contiguous axial images were obtained from the base of the skull
through the vertex without intravenous contrast.

[Series 2: head wo · axial · 0.42mm/px · z∈[+463,+578]mm · 9 of 28 slices shown, 12 images]
[im 3/28  brain]
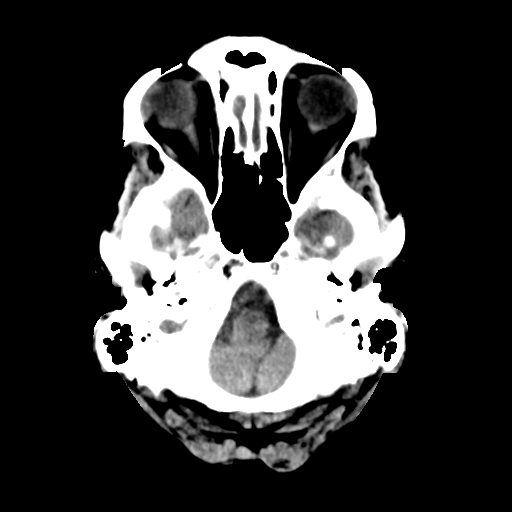
[im 3/28  bone]
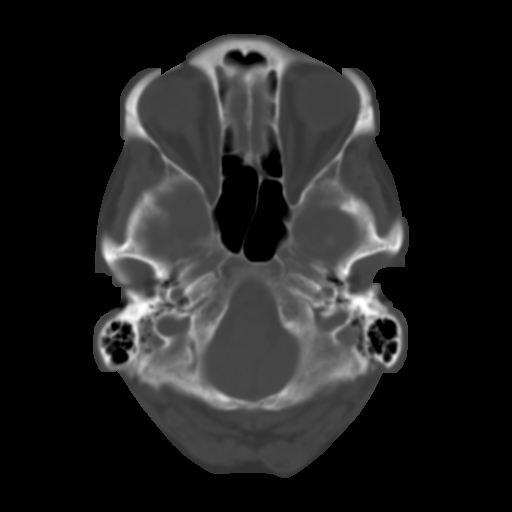
[im 6/28  brain]
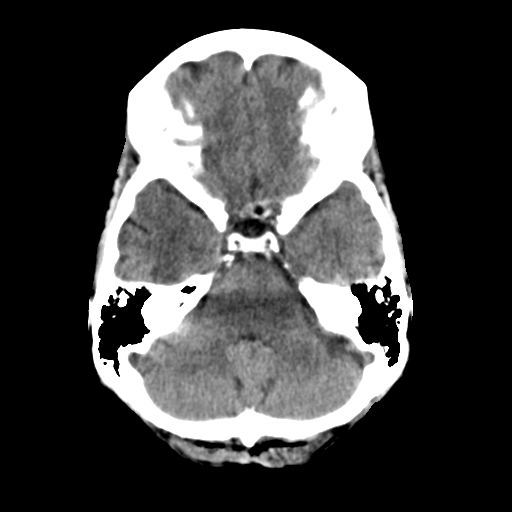
[im 9/28  brain]
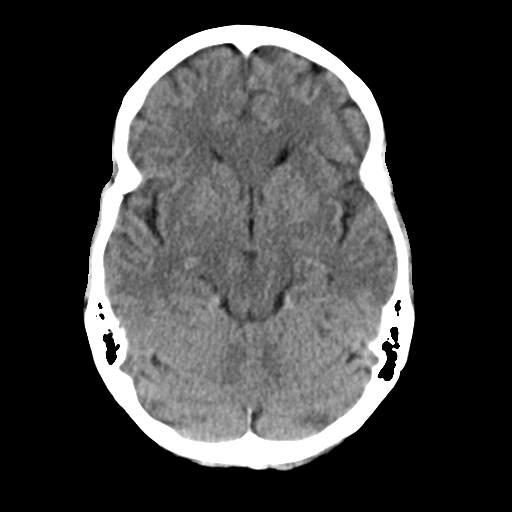
[im 12/28  brain]
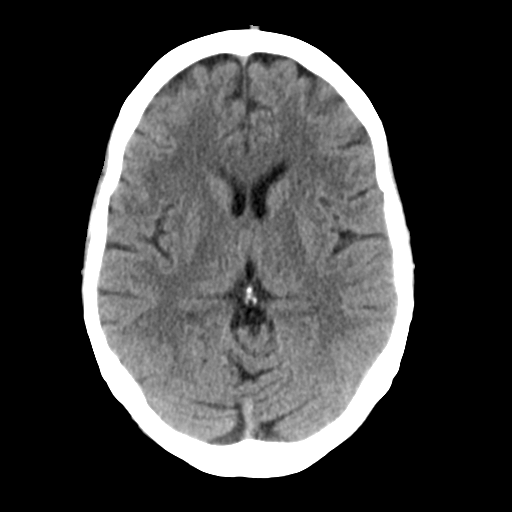
[im 15/28  brain]
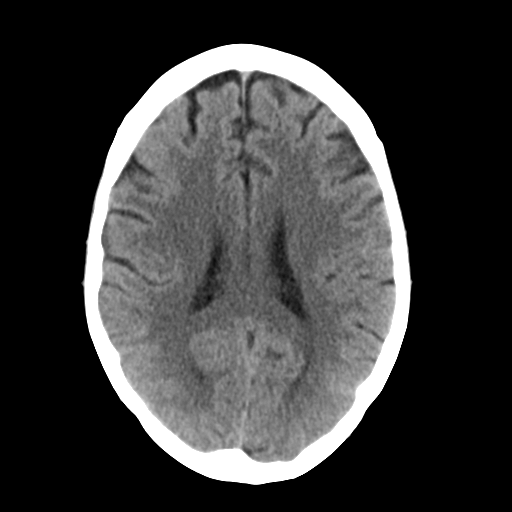
[im 15/28  bone]
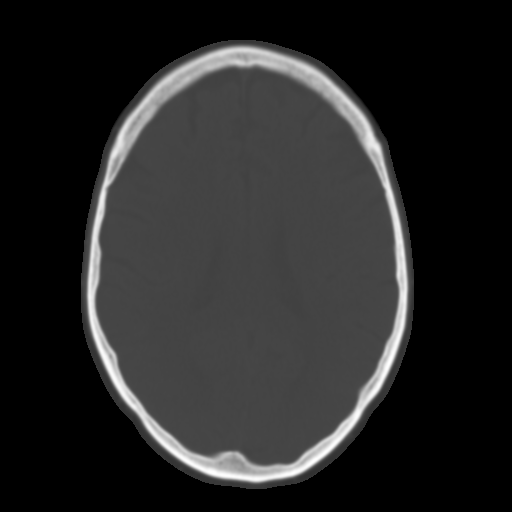
[im 17/28  brain]
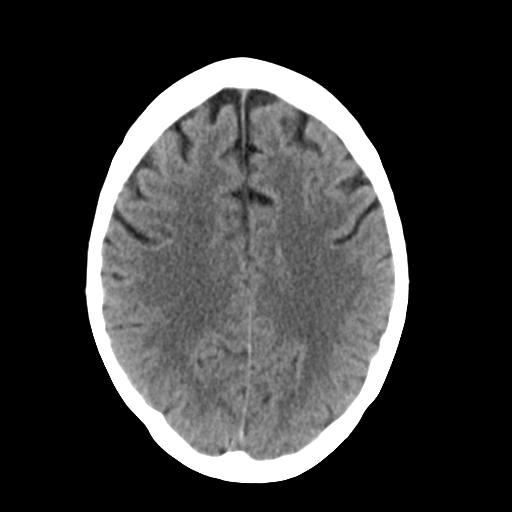
[im 20/28  brain]
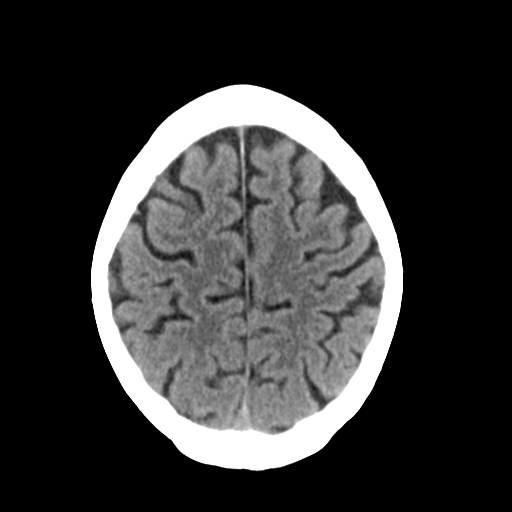
[im 23/28  brain]
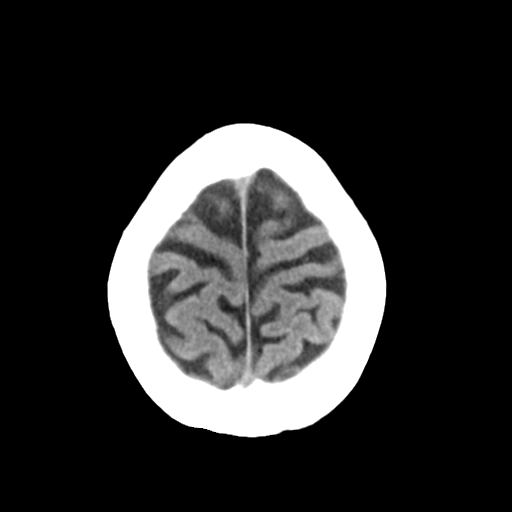
[im 26/28  brain]
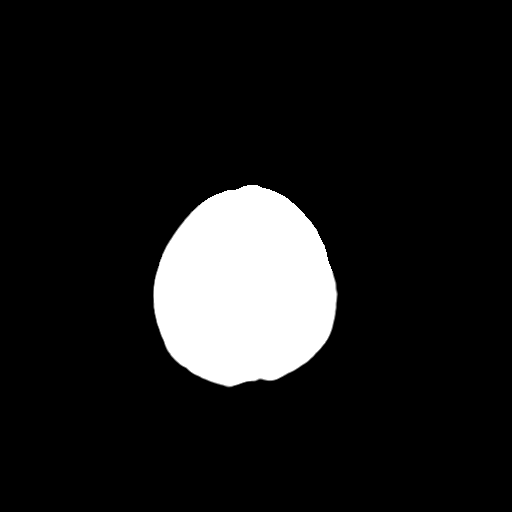
[im 26/28  bone]
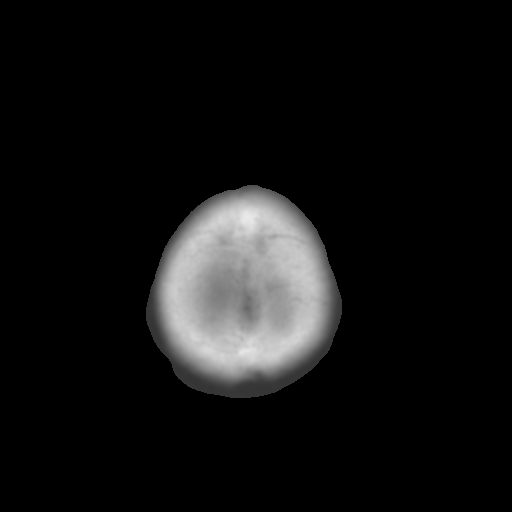

[Series 4: coronal soft tissue · coronal · 0.30mm/px · 3 of 65 slices shown]
[im 22/65  brain]
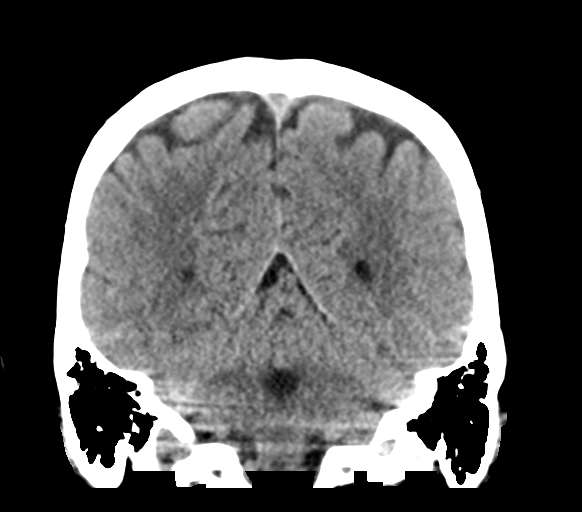
[im 29/65  brain]
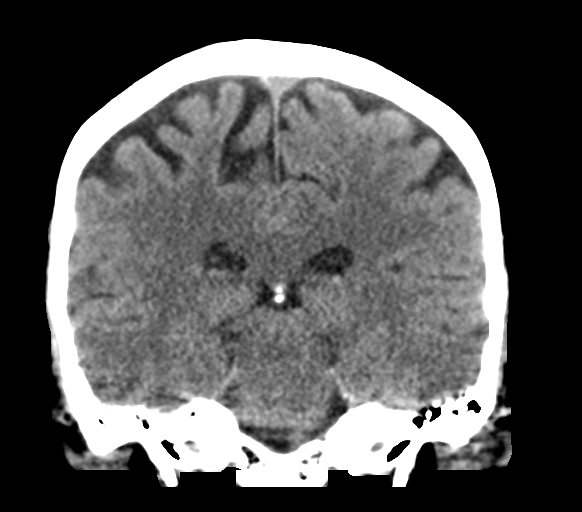
[im 36/65  brain]
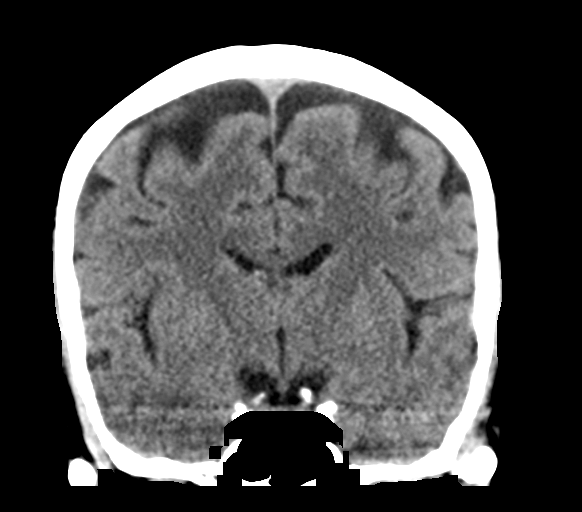

[Series 5: sagittal soft tissue · sagittal · 0.29mm/px · 3 of 49 slices shown]
[im 17/49  brain]
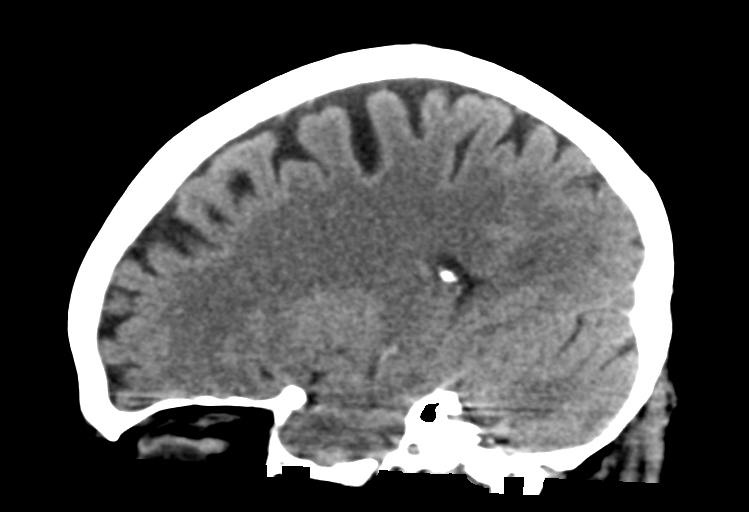
[im 25/49  brain]
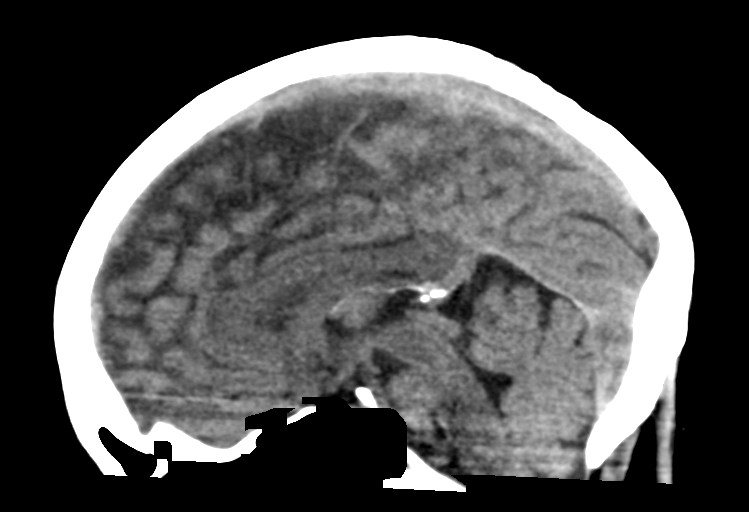
[im 33/49  brain]
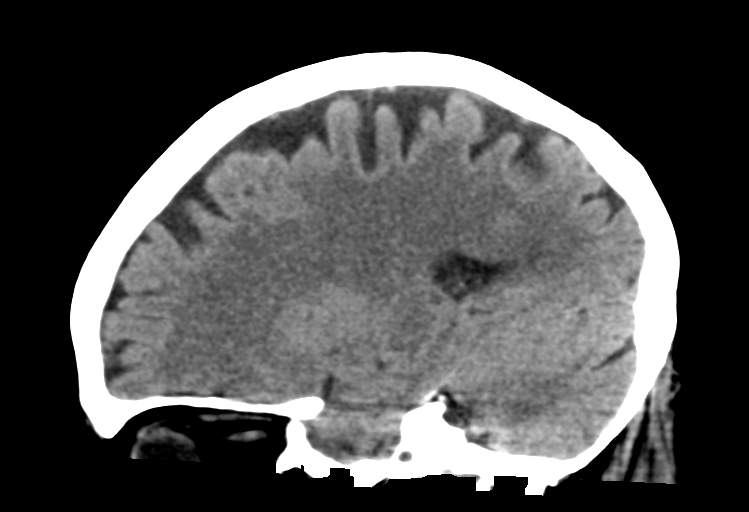

[15 of 45 positions shown; findings below may reference images not displayed]

FINDINGS: Brain: No mass lesion or acute hemorrhage. No focal hypoattenuation
of the basal ganglia or cortex to indicate infarcted tissue. No
hydrocephalus or age advanced atrophy.

Vascular: No hyperdense vessel. No advanced atherosclerotic
calcification of the arteries at the skull base.

Skull: Normal visualized skull base, calvarium and extracranial soft
tissues.

Sinuses/Orbits: No sinus fluid levels or advanced mucosal
thickening. No mastoid effusion. Normal orbits.

ASPECTS (Alberta Stroke Program Early CT Score)

- Ganglionic level infarction (caudate, lentiform nuclei, internal
capsule, insula, M1-M3 cortex): 7

- Supraganglionic infarction (M4-M6 cortex): 3

Total score (0-10 with 10 being normal): 10
IMPRESSION: 1. Normal head CT
2. ASPECTS is 10.

These results were called by telephone at the time of interpretation
on 10/04/2017 at [DATE] to Dr. CECIBEL SILBER , who verbally
acknowledged these results.

## 2019-01-22 ENCOUNTER — Telehealth: Payer: Self-pay

## 2019-01-22 ENCOUNTER — Other Ambulatory Visit (INDEPENDENT_AMBULATORY_CARE_PROVIDER_SITE_OTHER): Payer: Medicare HMO

## 2019-01-22 DIAGNOSIS — E876 Hypokalemia: Secondary | ICD-10-CM | POA: Diagnosis not present

## 2019-01-22 LAB — BASIC METABOLIC PANEL
BUN: 14 mg/dL (ref 6–23)
CO2: 36 mEq/L — ABNORMAL HIGH (ref 19–32)
Calcium: 8.7 mg/dL (ref 8.4–10.5)
Chloride: 99 mEq/L (ref 96–112)
Creatinine, Ser: 1.19 mg/dL (ref 0.40–1.20)
GFR: 45.44 mL/min — AB (ref >60.00)
GLUCOSE: 96 mg/dL (ref 70–99)
Potassium: 3.1 mEq/L — ABNORMAL LOW (ref 3.5–5.1)
Sodium: 143 mEq/L (ref 135–145)

## 2019-01-22 IMAGING — MR MR HEAD W/O CM
11 series · 48 of 48 positions shown · non-contrast
Comparison: MR brain 09/05/2017.

CLINICAL DATA: RIGHT-sided weakness.  Patient woke up and fell.

EXAM:
MRI HEAD WITHOUT CONTRAST
MRA HEAD WITHOUT CONTRAST
TECHNIQUE: Multiplanar, multiecho pulse sequences of the brain and surrounding
structures were obtained without intravenous contrast. Angiographic
images of the head were obtained using MRA technique without
contrast.

[Series 2: TOF · axial · non-contrast · 0.7mm · 0.37mm/px · z∈[+17,+92]mm · 8 of 110 slices shown]
[im 1/110]
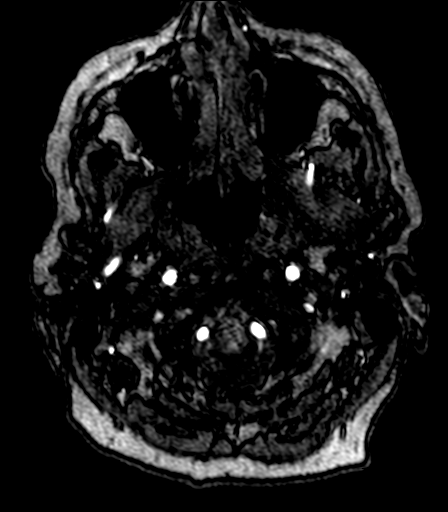
[im 16/110]
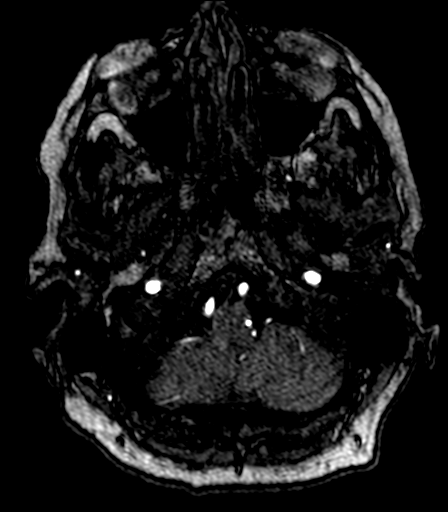
[im 32/110]
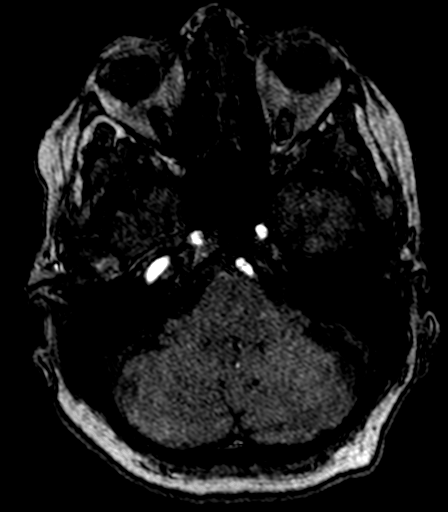
[im 47/110]
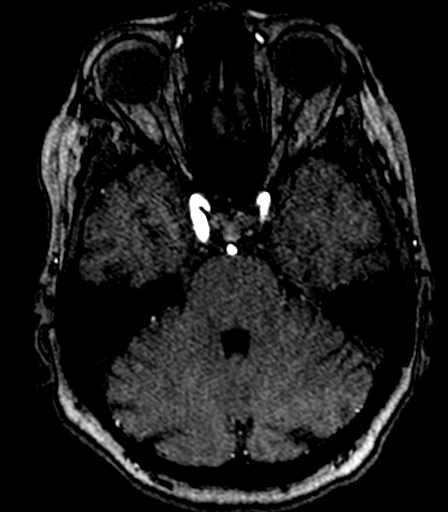
[im 63/110]
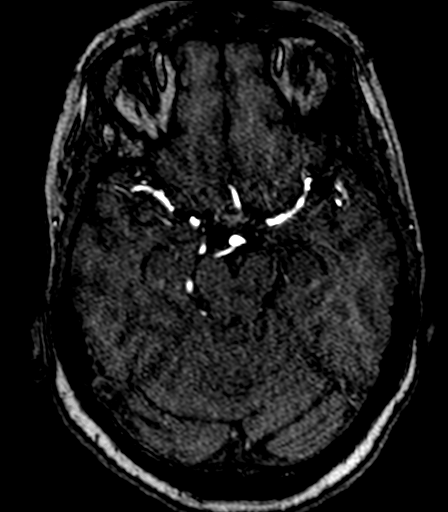
[im 78/110]
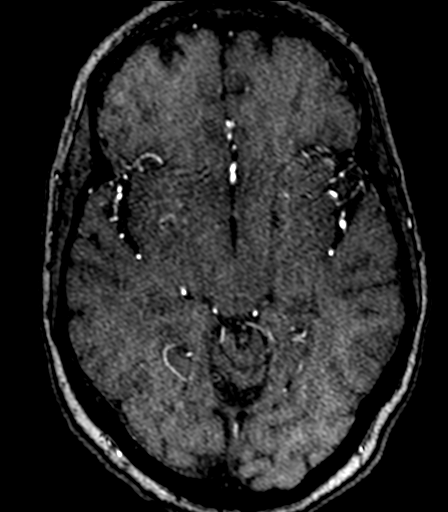
[im 94/110]
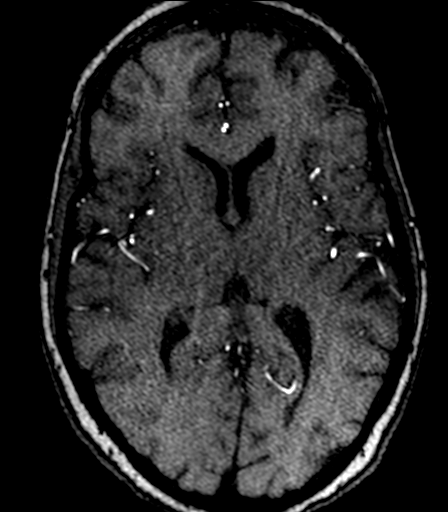
[im 110/110]
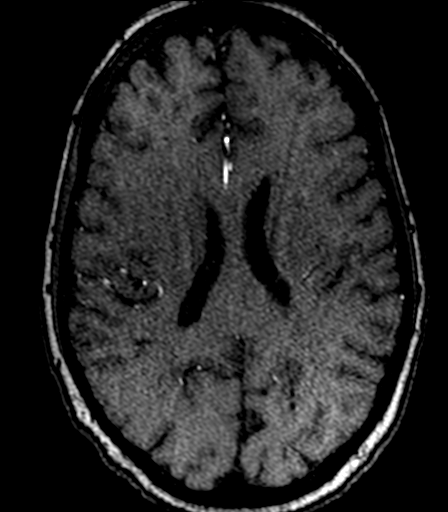

[Series 7: T1 · sagittal · 5.0mm · 0.45mm/px · 2 of 25 slices shown (1 of 2)]
[im 1/25]
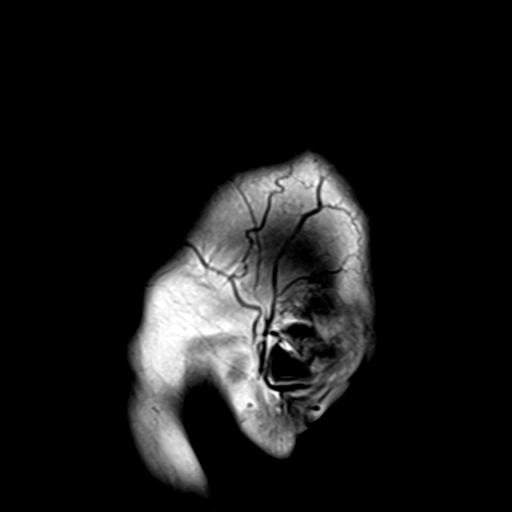
[im 25/25]
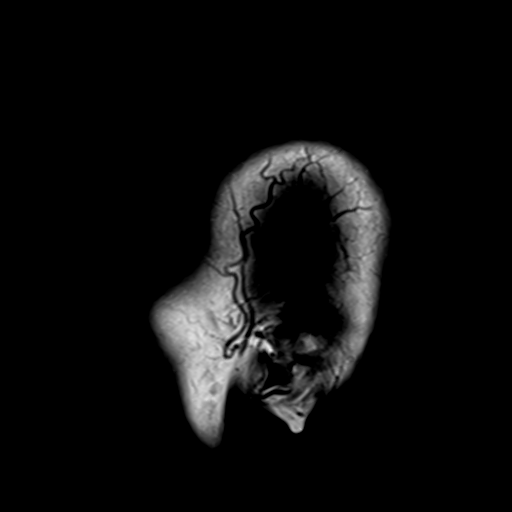

[Series 9: DWI · axial · 3.0mm · 1.80mm/px · z∈[-6,+155]mm · 4 of 53 slices shown (1 of 3)]
[im 1/53]
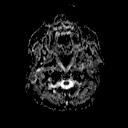
[im 18/53]
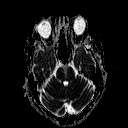
[im 35/53]
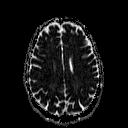
[im 53/53]
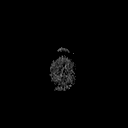

[Series 11: DWI · coronal · 3.0mm · 1.80mm/px · 4 of 45 slices shown (2 of 3)]
[im 1/45]
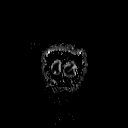
[im 15/45]
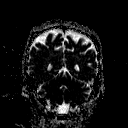
[im 30/45]
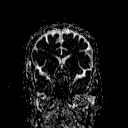
[im 45/45]
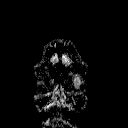

[Series 12: T2 · axial · 5.0mm · 0.60mm/px · z∈[-23,+140]mm · 2 of 26 slices shown (1 of 3)]
[im 1/26]
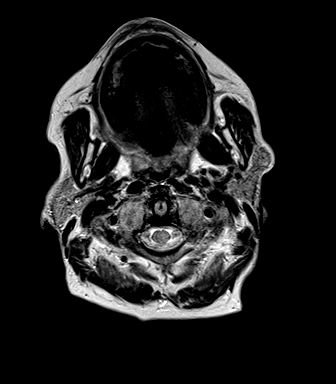
[im 26/26]
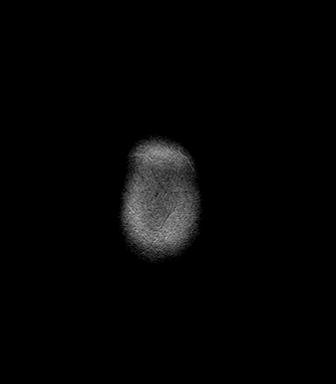

[Series 13: FLAIR · axial · 5.0mm · 0.45mm/px · z∈[-23,+140]mm · 2 of 26 slices shown]
[im 1/26]
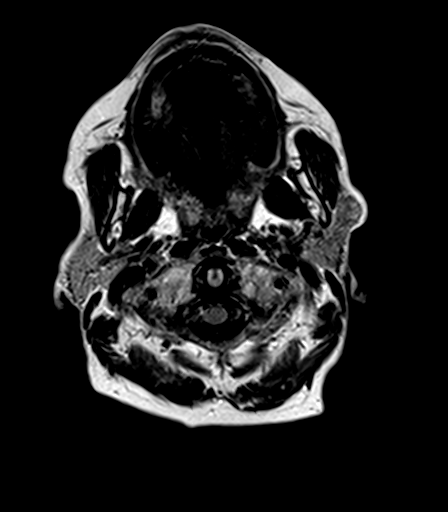
[im 26/26]
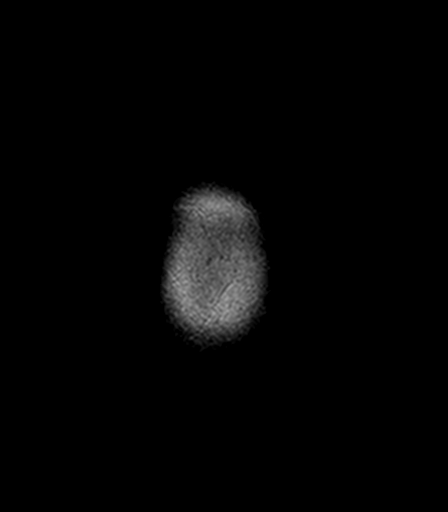

[Series 14: T2 · axial · 5.0mm · 0.45mm/px · z∈[-23,+140]mm · 2 of 26 slices shown (2 of 3)]
[im 1/26]
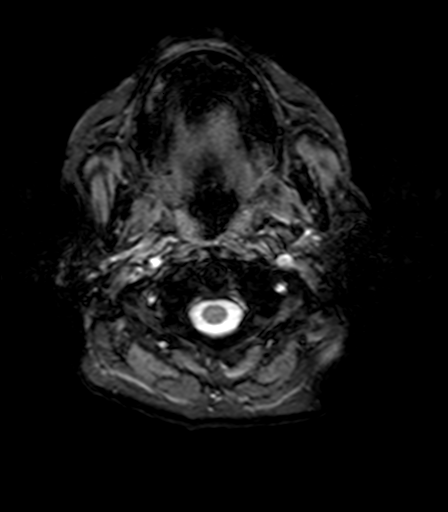
[im 26/26]
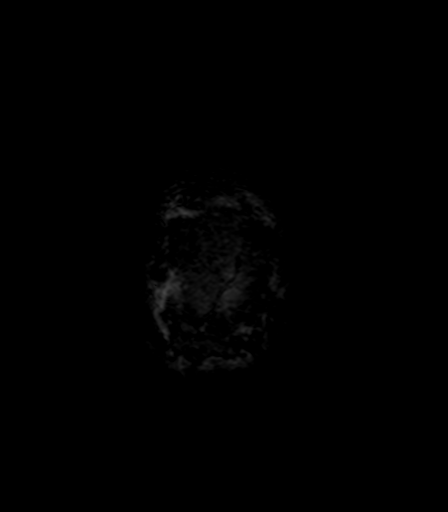

[Series 15: T1 · axial · 1.0mm · 1.00mm/px · z∈[-29,+146]mm · 14 of 176 slices shown (2 of 2)]
[im 1/176]
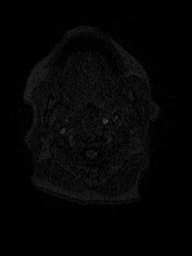
[im 14/176]
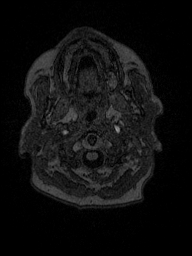
[im 27/176]
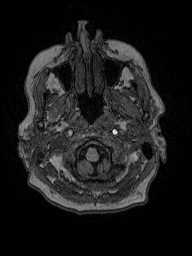
[im 41/176]
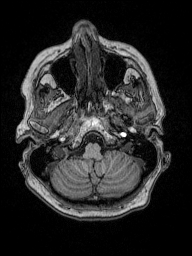
[im 54/176]
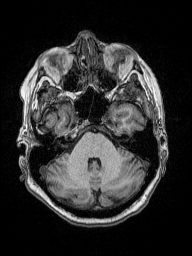
[im 68/176]
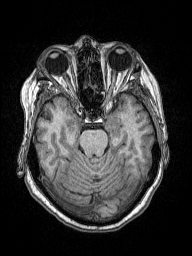
[im 81/176]
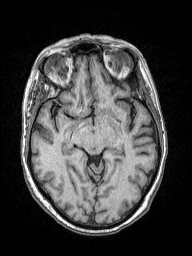
[im 95/176]
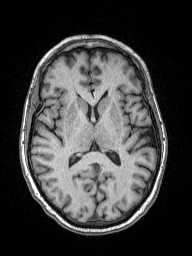
[im 108/176]
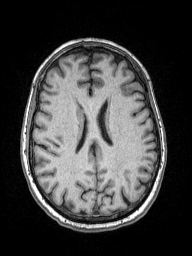
[im 122/176]
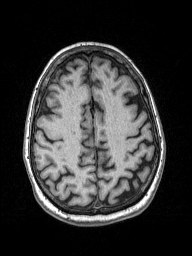
[im 135/176]
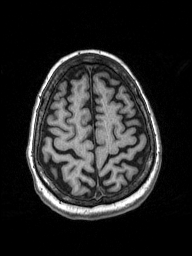
[im 149/176]
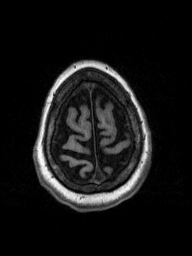
[im 162/176]
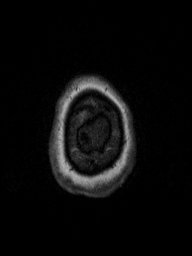
[im 176/176]
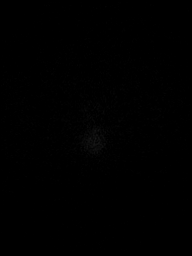

[Series 16: T2 · coronal · 5.0mm · 0.49mm/px · 2 of 28 slices shown (3 of 3)]
[im 1/28]
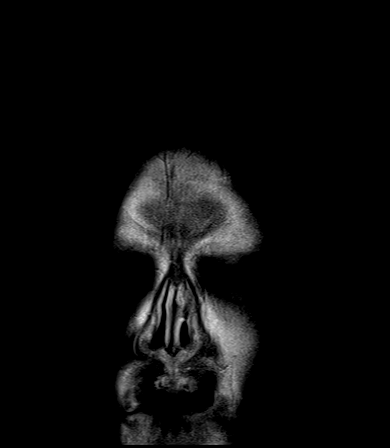
[im 28/28]
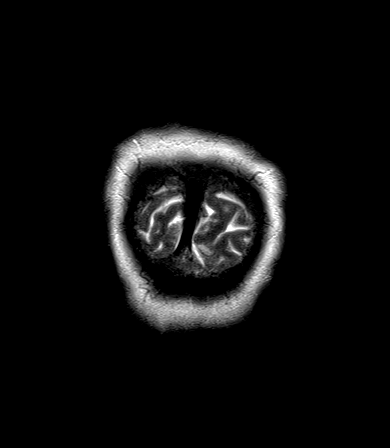

[Series 100: DWI · coronal · 3.0mm · 1.80mm/px · 4 of 45 slices shown (3 of 3)]
[im 1/45]
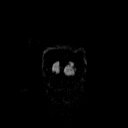
[im 15/45]
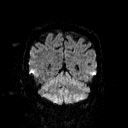
[im 30/45]
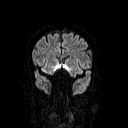
[im 45/45]
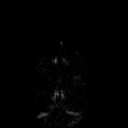

[Series 101: (id) · axial · 3.0mm · 1.80mm/px · z∈[-6,+155]mm · 4 of 55 slices shown]
[im 1/55]
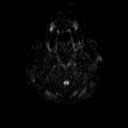
[im 19/55]
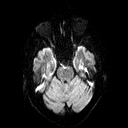
[im 37/55]
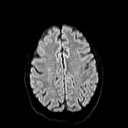
[im 55/55]
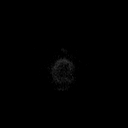

[48 of 48 positions shown; findings below may reference images not displayed]

FINDINGS: MRI HEAD FINDINGS

Brain: No evidence for acute infarction, hemorrhage, mass lesion,
hydrocephalus, or extra-axial fluid. Normal for age cerebral volume.
Minor white matter disease likely chronic microvascular ischemic
change from hypertension and/or hyperlipidemia. Chronic LEFT
thalamic lacunar stroke.

Vascular: Normal flow voids.

Skull and upper cervical spine: Normal marrow signal.

Sinuses/Orbits: Negative.

Other: None.

MRA HEAD FINDINGS

The internal carotid arteries are widely patent. The basilar artery
is widely patent with vertebrals codominant. There is no proximal
stenosis of the anterior, middle, or posterior cerebral arteries. No
cerebellar branch occlusion. No intracranial saccular aneurysm.
IMPRESSION: Unremarkable MRI brain. No acute stroke or interval change from
priors. Minor white matter disease.

Unremarkable MRA of the intracranial circulation.

## 2019-01-22 IMAGING — MR MR CERVICAL SPINE W/O CM
5 series · 33 of 48 positions shown · non-contrast
Comparison: None.

CLINICAL DATA: Neck and BILATERAL arm pain.

EXAM:
MRI CERVICAL SPINE WITHOUT CONTRAST
TECHNIQUE: Multiplanar, multisequence MR imaging of the cervical spine was
performed. No intravenous contrast was administered.

[Series 2: T2 · sagittal · 3.0mm · 0.70mm/px · 6 of 13 slices shown (1 of 2)]
[im 1/13]
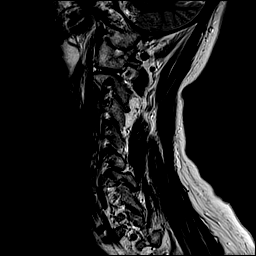
[im 3/13]
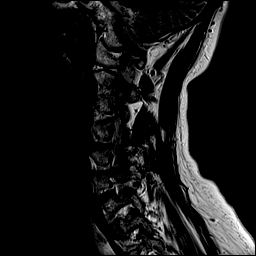
[im 5/13]
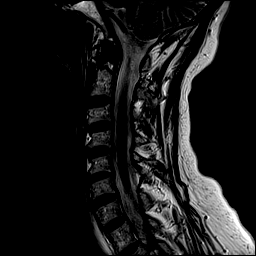
[im 8/13]
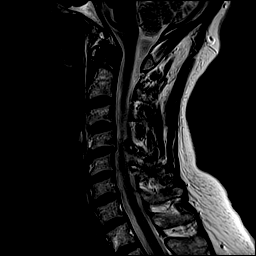
[im 10/13]
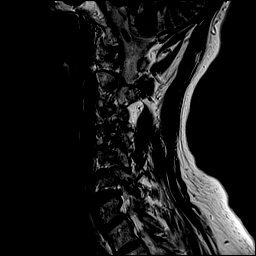
[im 13/13]
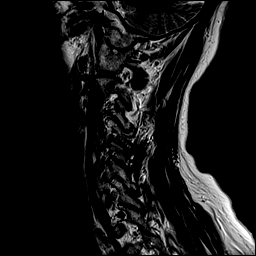

[Series 3: T1 · sagittal · 3.0mm · 0.70mm/px · 7 of 13 slices shown]
[im 1/13]
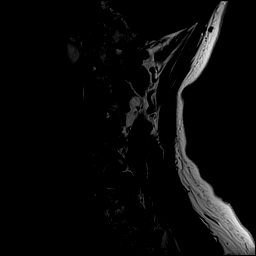
[im 3/13]
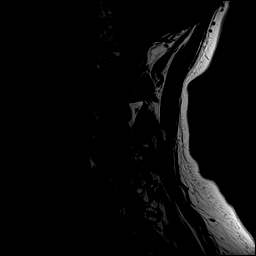
[im 5/13]
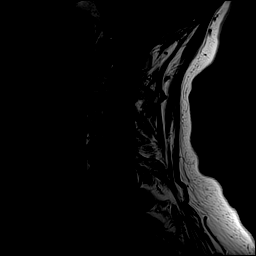
[im 7/13]
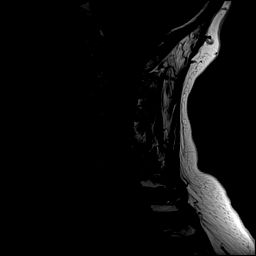
[im 9/13]
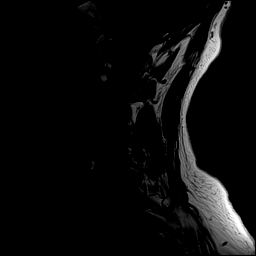
[im 11/13]
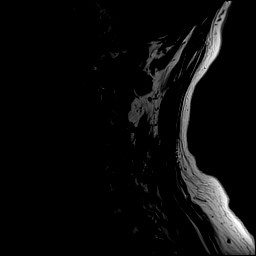
[im 13/13]
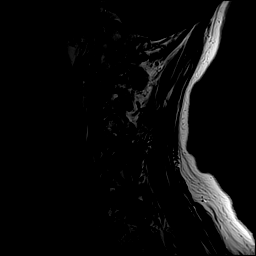

[Series 4: STIR · sagittal · 3.0mm · 0.35mm/px · 7 of 13 slices shown]
[im 1/13]
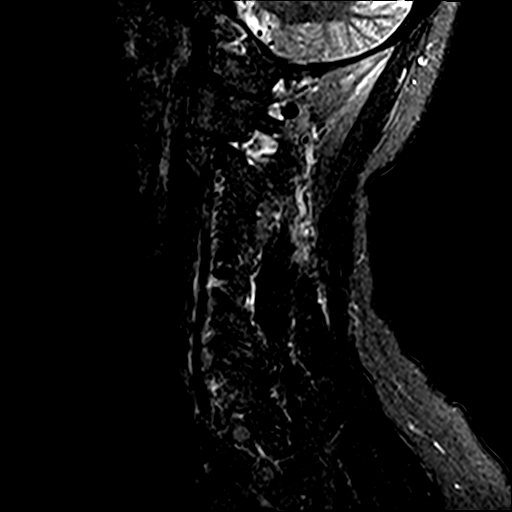
[im 3/13]
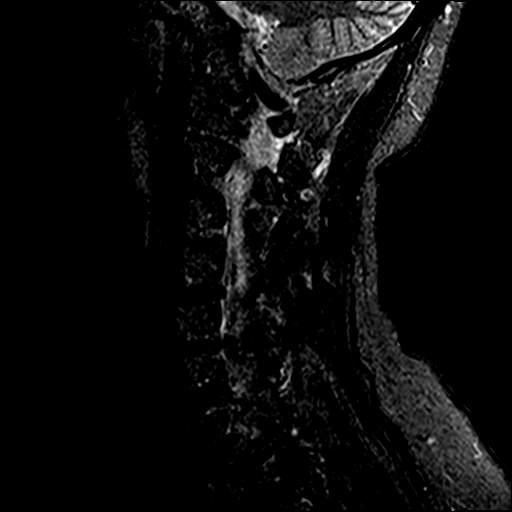
[im 5/13]
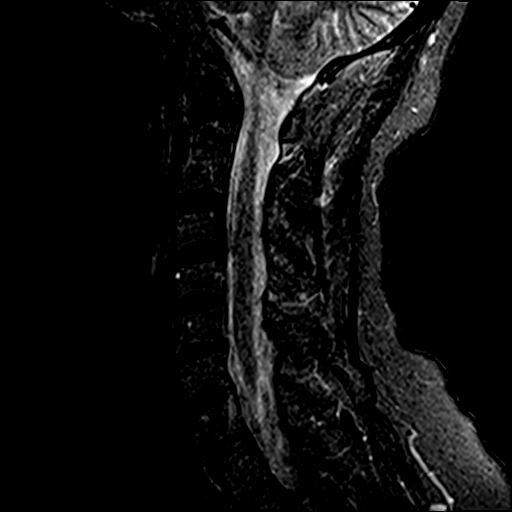
[im 7/13]
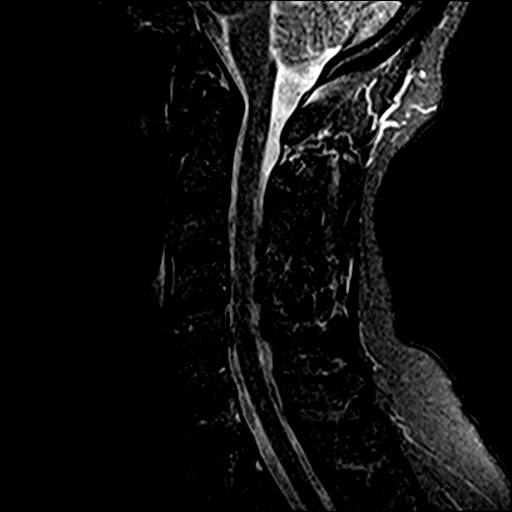
[im 9/13]
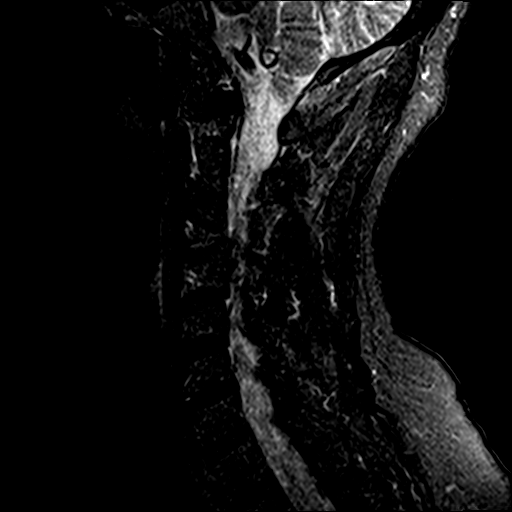
[im 11/13]
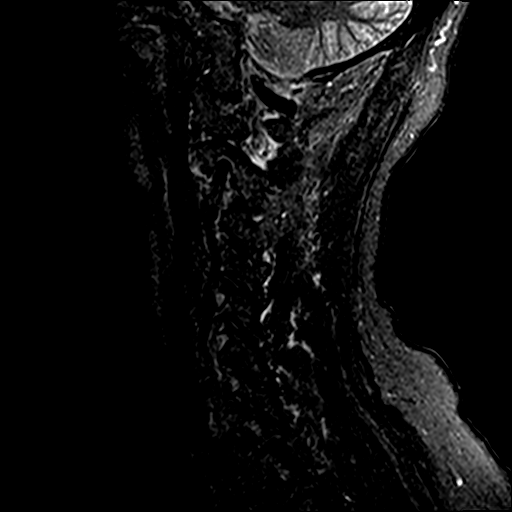
[im 13/13]
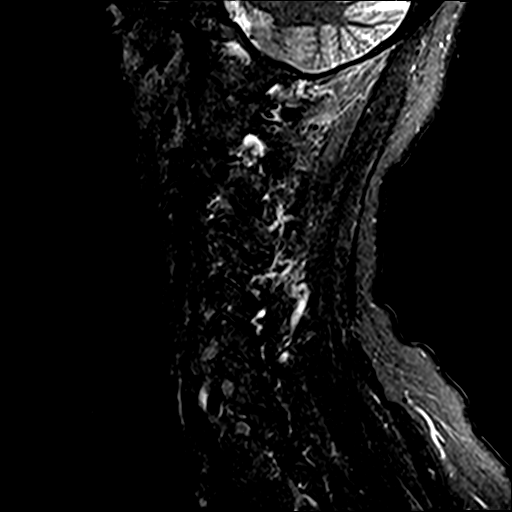

[Series 5: T2 · axial · 3.0mm · 0.70mm/px · z∈[-87,+16]mm · 8 of 28 slices shown (2 of 2)]
[im 1/28]
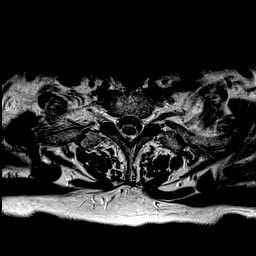
[im 5/28]
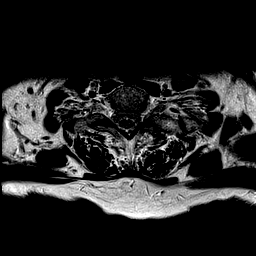
[im 9/28]
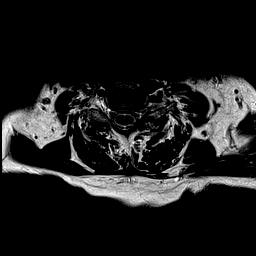
[im 13/28]
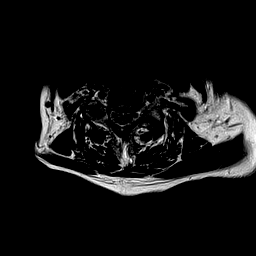
[im 15/28]
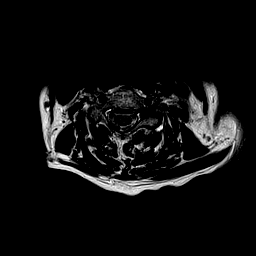
[im 19/28]
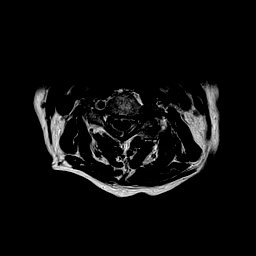
[im 23/28]
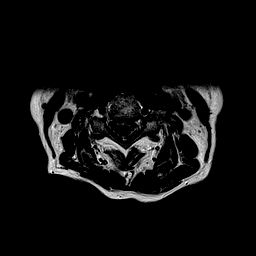
[im 28/28]
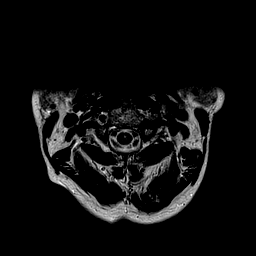

[Series 6: mpgr ax · axial · 3.0mm · 0.35mm/px · z∈[-87,-33]mm · 5 of 28 slices shown]
[im 1/28]
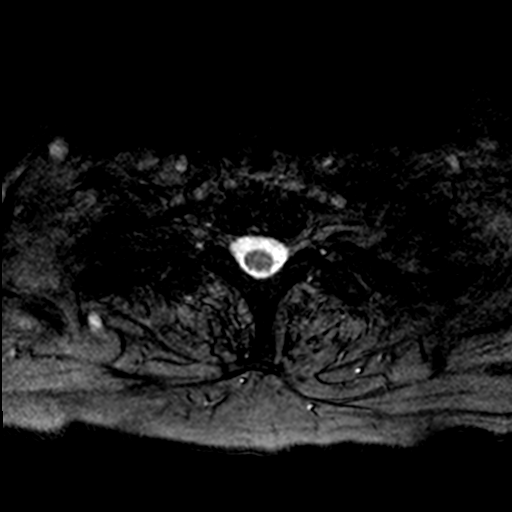
[im 5/28]
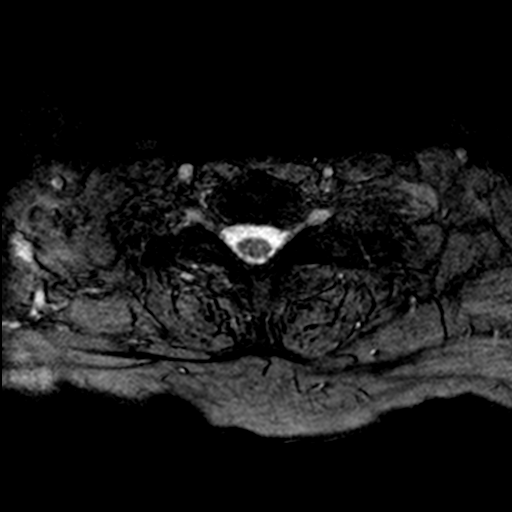
[im 9/28]
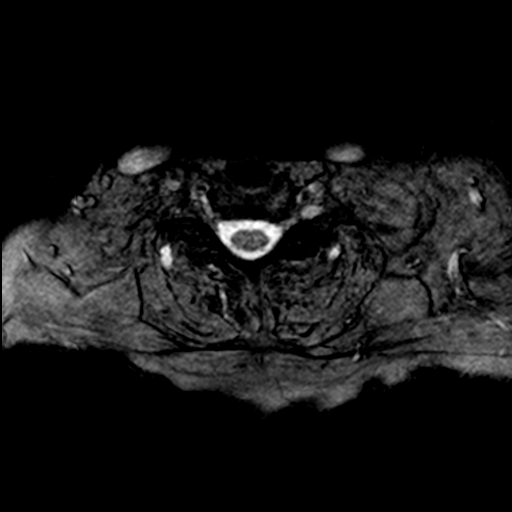
[im 13/28]
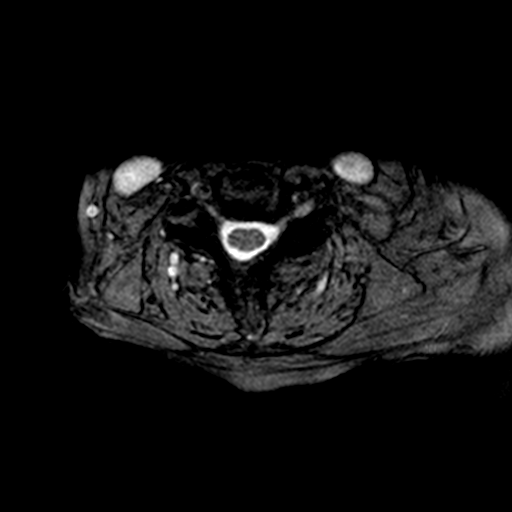
[im 15/28]
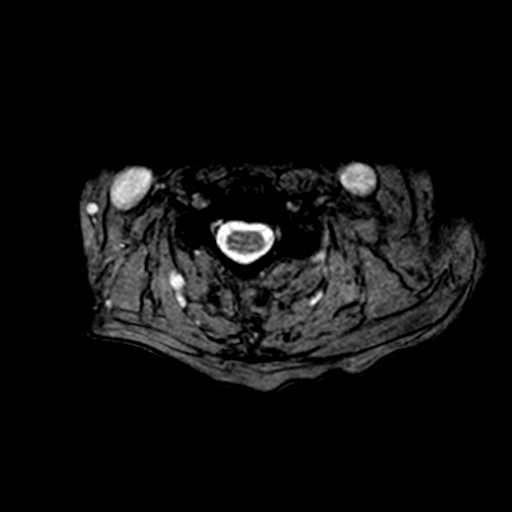

[33 of 48 positions shown; findings below may reference images not displayed]

FINDINGS: Alignment: Trace anterolisthesis C3-4, trace retrolisthesis C4-5,
both facet mediated.

Vertebrae: No fracture, evidence of discitis, or bone lesion.

Cord: Normal signal and morphology.

Posterior Fossa, vertebral arteries, paraspinal tissues: Negative.

Disc levels:

C2-3:  Normal.

C3-4: Trace anterolisthesis. LEFT greater than RIGHT facet
arthropathy. Uncinate spurring also asymmetric to the LEFT. LEFT C4
foraminal narrowing. No soft disc protrusion.

C4-5: Trace retrolisthesis. BILATERAL facet arthropathy. LEFT
uncinate spurring contributes to LEFT C5 foraminal narrowing.

C5-6: BILATERAL facet arthropathy. Shallow central protrusion. RIGHT
greater than LEFT C6 foraminal narrowing

C6-7: LEFT greater than RIGHT facet arthropathy and uncinate
spurring. Mild LEFT C7 foraminal narrowing.

C7-T1:  Unremarkable
IMPRESSION: Multilevel spondylosis involving both disc and facet pathology.

Potentially symptomatic foraminal narrowing at C3-4 through C6-7 as
described above.

If further investigation desired, cervical myelogram and
postmyelogram CT, could provide additional information regarding the
relative contributions of each level to the patient's pain syndrome.

## 2019-01-22 NOTE — Addendum Note (Signed)
Addended by: Ellamae Sia on: 01/22/2019 02:53 PM   Modules accepted: Orders

## 2019-01-22 NOTE — Addendum Note (Signed)
Addended by: [name] on: 01/22/2019 02:17 PM   Modules accepted: Orders

## 2019-01-22 NOTE — Telephone Encounter (Signed)
Left message for patient to call back. See result notes

## 2019-01-22 NOTE — Progress Notes (Signed)
Hypokalemia persisting and worsening even with more replacement  Increase Potassium Repeat in 1 week Recommend patient reach out to Cardiologist as well

## 2019-01-30 ENCOUNTER — Other Ambulatory Visit: Payer: Medicare HMO

## 2019-01-30 ENCOUNTER — Other Ambulatory Visit (INDEPENDENT_AMBULATORY_CARE_PROVIDER_SITE_OTHER): Payer: Medicare HMO

## 2019-01-30 DIAGNOSIS — E876 Hypokalemia: Secondary | ICD-10-CM | POA: Diagnosis not present

## 2019-01-30 LAB — BASIC METABOLIC PANEL
BUN: 15 mg/dL (ref 6–23)
CHLORIDE: 94 meq/L — AB (ref 96–112)
CO2: 37 mEq/L — ABNORMAL HIGH (ref 19–32)
Calcium: 9 mg/dL (ref 8.4–10.5)
Creatinine, Ser: 1.25 mg/dL — ABNORMAL HIGH (ref 0.40–1.20)
GFR: 42.93 mL/min — ABNORMAL LOW (ref >60.00)
Glucose, Bld: 104 mg/dL — ABNORMAL HIGH (ref 70–99)
Potassium: 3.2 mEq/L — ABNORMAL LOW (ref 3.5–5.1)
Sodium: 141 mEq/L (ref 135–145)

## 2019-02-02 ENCOUNTER — Telehealth: Payer: Self-pay | Admitting: Family Medicine

## 2019-02-02 NOTE — Telephone Encounter (Signed)
Called pt to discuss lab results  Notes that she is otherwise feeling fine.   Taking >100 meq of potassium daily and K is still low.   Missed her Cardiology appointment a few months ago due to illness.   Advised rescheduling as we are having a hard time controlling potassium and think that cardiology will be helpful.   She agreed and will call cardiology  Advised her calling us if not able to get in during the next 1-2 weeks as I can call the office and see if they will see her sooner.   [name]

## 2019-02-10 ENCOUNTER — Encounter: Payer: Medicare HMO | Admitting: Family Medicine

## 2019-02-16 ENCOUNTER — Emergency Department (HOSPITAL_COMMUNITY): Payer: Medicare HMO

## 2019-02-16 ENCOUNTER — Other Ambulatory Visit: Payer: Self-pay

## 2019-02-16 ENCOUNTER — Encounter: Payer: Self-pay | Admitting: Family Medicine

## 2019-02-16 ENCOUNTER — Encounter (HOSPITAL_COMMUNITY): Payer: Self-pay | Admitting: Emergency Medicine

## 2019-02-16 ENCOUNTER — Inpatient Hospital Stay (HOSPITAL_COMMUNITY): Payer: Medicare HMO

## 2019-02-16 ENCOUNTER — Ambulatory Visit (INDEPENDENT_AMBULATORY_CARE_PROVIDER_SITE_OTHER): Payer: Medicare HMO | Admitting: Family Medicine

## 2019-02-16 ENCOUNTER — Inpatient Hospital Stay (HOSPITAL_COMMUNITY)
Admission: EM | Admit: 2019-02-16 | Discharge: 2019-02-19 | DRG: 391 | Disposition: A | Discharge status: Home / Self Care | Payer: Medicare HMO | Attending: Internal Medicine | Admitting: Internal Medicine

## 2019-02-16 VITALS — BP 156/92 | HR 94 | Temp 98.3°F | Resp 20 | Ht 70.5 in | Wt 219.8 lb

## 2019-02-16 DIAGNOSIS — Z90710 Acquired absence of both cervix and uterus: Secondary | ICD-10-CM

## 2019-02-16 DIAGNOSIS — F329 Major depressive disorder, single episode, unspecified: Secondary | ICD-10-CM | POA: Diagnosis present

## 2019-02-16 DIAGNOSIS — R8271 Bacteriuria: Secondary | ICD-10-CM | POA: Diagnosis present

## 2019-02-16 DIAGNOSIS — A084 Viral intestinal infection, unspecified: Secondary | ICD-10-CM | POA: Diagnosis not present

## 2019-02-16 DIAGNOSIS — K219 Gastro-esophageal reflux disease without esophagitis: Secondary | ICD-10-CM | POA: Diagnosis present

## 2019-02-16 DIAGNOSIS — I10 Essential (primary) hypertension: Secondary | ICD-10-CM | POA: Diagnosis not present

## 2019-02-16 DIAGNOSIS — D509 Iron deficiency anemia, unspecified: Secondary | ICD-10-CM | POA: Diagnosis present

## 2019-02-16 DIAGNOSIS — J9601 Acute respiratory failure with hypoxia: Secondary | ICD-10-CM | POA: Diagnosis present

## 2019-02-16 DIAGNOSIS — F339 Major depressive disorder, recurrent, unspecified: Secondary | ICD-10-CM | POA: Diagnosis present

## 2019-02-16 DIAGNOSIS — I509 Heart failure, unspecified: Secondary | ICD-10-CM | POA: Diagnosis not present

## 2019-02-16 DIAGNOSIS — E785 Hyperlipidemia, unspecified: Secondary | ICD-10-CM | POA: Diagnosis present

## 2019-02-16 DIAGNOSIS — J441 Chronic obstructive pulmonary disease with (acute) exacerbation: Secondary | ICD-10-CM | POA: Diagnosis present

## 2019-02-16 DIAGNOSIS — Z79899 Other long term (current) drug therapy: Secondary | ICD-10-CM

## 2019-02-16 DIAGNOSIS — F32A Depression, unspecified: Secondary | ICD-10-CM | POA: Diagnosis present

## 2019-02-16 DIAGNOSIS — N183 Chronic kidney disease, stage 3 (moderate): Secondary | ICD-10-CM | POA: Diagnosis present

## 2019-02-16 DIAGNOSIS — R197 Diarrhea, unspecified: Secondary | ICD-10-CM | POA: Diagnosis not present

## 2019-02-16 DIAGNOSIS — D72829 Elevated white blood cell count, unspecified: Secondary | ICD-10-CM | POA: Diagnosis present

## 2019-02-16 DIAGNOSIS — Z885 Allergy status to narcotic agent status: Secondary | ICD-10-CM | POA: Diagnosis not present

## 2019-02-16 DIAGNOSIS — R112 Nausea with vomiting, unspecified: Secondary | ICD-10-CM

## 2019-02-16 DIAGNOSIS — Z8349 Family history of other endocrine, nutritional and metabolic diseases: Secondary | ICD-10-CM | POA: Diagnosis not present

## 2019-02-16 DIAGNOSIS — E876 Hypokalemia: Secondary | ICD-10-CM | POA: Diagnosis present

## 2019-02-16 DIAGNOSIS — R1114 Bilious vomiting: Secondary | ICD-10-CM

## 2019-02-16 DIAGNOSIS — Z87891 Personal history of nicotine dependence: Secondary | ICD-10-CM | POA: Diagnosis not present

## 2019-02-16 DIAGNOSIS — E860 Dehydration: Secondary | ICD-10-CM | POA: Diagnosis not present

## 2019-02-16 DIAGNOSIS — I341 Nonrheumatic mitral (valve) prolapse: Secondary | ICD-10-CM | POA: Diagnosis present

## 2019-02-16 DIAGNOSIS — I5032 Chronic diastolic (congestive) heart failure: Secondary | ICD-10-CM | POA: Diagnosis present

## 2019-02-16 DIAGNOSIS — G8929 Other chronic pain: Secondary | ICD-10-CM | POA: Diagnosis present

## 2019-02-16 DIAGNOSIS — I130 Hypertensive heart and chronic kidney disease with heart failure and stage 1 through stage 4 chronic kidney disease, or unspecified chronic kidney disease: Secondary | ICD-10-CM | POA: Diagnosis present

## 2019-02-16 DIAGNOSIS — R0602 Shortness of breath: Secondary | ICD-10-CM | POA: Diagnosis not present

## 2019-02-16 DIAGNOSIS — I340 Nonrheumatic mitral (valve) insufficiency: Secondary | ICD-10-CM | POA: Diagnosis present

## 2019-02-16 DIAGNOSIS — Z8249 Family history of ischemic heart disease and other diseases of the circulatory system: Secondary | ICD-10-CM

## 2019-02-16 DIAGNOSIS — R748 Abnormal levels of other serum enzymes: Secondary | ICD-10-CM | POA: Diagnosis present

## 2019-02-16 DIAGNOSIS — R1084 Generalized abdominal pain: Secondary | ICD-10-CM | POA: Diagnosis not present

## 2019-02-16 DIAGNOSIS — B9620 Unspecified Escherichia coli [E. coli] as the cause of diseases classified elsewhere: Secondary | ICD-10-CM | POA: Diagnosis present

## 2019-02-16 DIAGNOSIS — Z7982 Long term (current) use of aspirin: Secondary | ICD-10-CM

## 2019-02-16 DIAGNOSIS — R109 Unspecified abdominal pain: Secondary | ICD-10-CM | POA: Diagnosis present

## 2019-02-16 DIAGNOSIS — Z91018 Allergy to other foods: Secondary | ICD-10-CM

## 2019-02-16 DIAGNOSIS — T380X5A Adverse effect of glucocorticoids and synthetic analogues, initial encounter: Secondary | ICD-10-CM | POA: Diagnosis present

## 2019-02-16 LAB — URINALYSIS, ROUTINE W REFLEX MICROSCOPIC
Bilirubin Urine: NEGATIVE
Glucose, UA: NEGATIVE mg/dL
Ketones, ur: NEGATIVE mg/dL
Nitrite: NEGATIVE
PROTEIN: 100 mg/dL — AB
Specific Gravity, Urine: 1.024 (ref 1.005–1.030)
pH: 5 (ref 5.0–8.0)

## 2019-02-16 LAB — CBC WITH DIFFERENTIAL/PLATELET
Abs Immature Granulocytes: 0.21 10*3/uL — ABNORMAL HIGH (ref 0.00–0.07)
Basophils Absolute: 0 10*3/uL (ref 0.0–0.1)
Basophils Relative: 0 %
Eosinophils Absolute: 0.1 10*3/uL (ref 0.0–0.5)
Eosinophils Relative: 0 %
HCT: 33.7 % — ABNORMAL LOW (ref 36.0–46.0)
Hemoglobin: 10.6 g/dL — ABNORMAL LOW (ref 12.0–15.0)
Immature Granulocytes: 1 %
Lymphocytes Relative: 6 %
Lymphs Abs: 1.2 10*3/uL (ref 0.7–4.0)
MCH: 24.7 pg — ABNORMAL LOW (ref 26.0–34.0)
MCHC: 31.5 g/dL (ref 30.0–36.0)
MCV: 78.4 fL — ABNORMAL LOW (ref 80.0–100.0)
Monocytes Absolute: 0.7 10*3/uL (ref 0.1–1.0)
Monocytes Relative: 3 %
Neutro Abs: 19.9 10*3/uL — ABNORMAL HIGH (ref 1.7–7.7)
Neutrophils Relative %: 90 %
Platelets: 290 10*3/uL (ref 150–400)
RBC: 4.3 MIL/uL (ref 3.87–5.11)
RDW: 14.7 % (ref 11.5–15.5)
WBC: 22.1 10*3/uL — ABNORMAL HIGH (ref 4.0–10.5)
nRBC: 0 % (ref 0.0–0.2)

## 2019-02-16 LAB — COMPREHENSIVE METABOLIC PANEL
ALT: 12 U/L (ref 0–44)
AST: 19 U/L (ref 15–41)
Albumin: 2.8 g/dL — ABNORMAL LOW (ref 3.5–5.0)
Alkaline Phosphatase: 148 U/L — ABNORMAL HIGH (ref 38–126)
Anion gap: 11 (ref 5–15)
BUN: 10 mg/dL (ref 8–23)
CO2: 33 mmol/L — ABNORMAL HIGH (ref 22–32)
Calcium: 8.5 mg/dL — ABNORMAL LOW (ref 8.9–10.3)
Chloride: 95 mmol/L — ABNORMAL LOW (ref 98–111)
Creatinine, Ser: 0.94 mg/dL (ref 0.44–1.00)
GFR calc Af Amer: >60 mL/min (ref >60)
GFR calc non Af Amer: >60 mL/min (ref >60)
Glucose, Bld: 128 mg/dL — ABNORMAL HIGH (ref 70–99)
Potassium: 2.2 mmol/L — CL (ref 3.5–5.1)
Sodium: 139 mmol/L (ref 135–145)
Total Bilirubin: 0.6 mg/dL (ref 0.3–1.2)
Total Protein: 6.4 g/dL — ABNORMAL LOW (ref 6.5–8.1)

## 2019-02-16 LAB — INFLUENZA PANEL BY PCR (TYPE A & B)
Influenza A By PCR: NEGATIVE
Influenza B By PCR: NEGATIVE

## 2019-02-16 MED ORDER — ASPIRIN EC 325 MG PO TBEC
325.0000 mg | DELAYED_RELEASE_TABLET | Freq: Every day | ORAL | Status: DC
  Administered 2019-02-17 – 2019-02-19 (×3): 325 mg via ORAL
  Filled 2019-02-16 (×4): qty 1

## 2019-02-16 MED ORDER — ALBUTEROL SULFATE (2.5 MG/3ML) 0.083% IN NEBU: 2.5000 mg | INHALATION_SOLUTION | RESPIRATORY_TRACT | Status: DC | PRN

## 2019-02-16 MED ORDER — POTASSIUM CHLORIDE 10 MEQ/100ML IV SOLN
10.0000 meq | INTRAVENOUS | Status: AC
  Filled 2019-02-16: qty 100

## 2019-02-16 MED ORDER — ATORVASTATIN CALCIUM 40 MG PO TABS
40.0000 mg | ORAL_TABLET | Freq: Every day | ORAL | Status: DC
  Administered 2019-02-16 – 2019-02-18 (×3): 40 mg via ORAL
  Filled 2019-02-16 (×3): qty 1

## 2019-02-16 MED ORDER — MIRTAZAPINE 15 MG PO TABS
45.0000 mg | ORAL_TABLET | Freq: Every day | ORAL | Status: DC
  Administered 2019-02-16 – 2019-02-18 (×3): 45 mg via ORAL
  Filled 2019-02-16 (×3): qty 3

## 2019-02-16 MED ORDER — POTASSIUM CHLORIDE IN NACL 20-0.9 MEQ/L-% IV SOLN
INTRAVENOUS | Status: DC
  Administered 2019-02-16 – 2019-02-17 (×2): via INTRAVENOUS
  Filled 2019-02-16 (×4): qty 1000

## 2019-02-16 MED ORDER — GABAPENTIN 400 MG PO CAPS
400.0000 mg | ORAL_CAPSULE | Freq: Three times a day (TID) | ORAL | Status: DC
  Administered 2019-02-16 – 2019-02-19 (×8): 400 mg via ORAL
  Filled 2019-02-16 (×8): qty 1

## 2019-02-16 MED ORDER — IPRATROPIUM-ALBUTEROL 0.5-2.5 (3) MG/3ML IN SOLN
3.0000 mL | Freq: Three times a day (TID) | RESPIRATORY_TRACT | Status: DC
  Administered 2019-02-17 – 2019-02-18 (×4): 3 mL via RESPIRATORY_TRACT
  Filled 2019-02-16 (×4): qty 3

## 2019-02-16 MED ORDER — ENOXAPARIN SODIUM 40 MG/0.4ML ~~LOC~~ SOLN
40.0000 mg | SUBCUTANEOUS | Status: DC
  Administered 2019-02-16 – 2019-02-18 (×3): 40 mg via SUBCUTANEOUS
  Filled 2019-02-16 (×3): qty 0.4

## 2019-02-16 MED ORDER — IPRATROPIUM-ALBUTEROL 0.5-2.5 (3) MG/3ML IN SOLN
3.0000 mL | Freq: Four times a day (QID) | RESPIRATORY_TRACT | Status: DC
  Administered 2019-02-16: 3 mL via RESPIRATORY_TRACT
  Filled 2019-02-16: qty 3

## 2019-02-16 MED ORDER — ONDANSETRON HCL 4 MG/2ML IJ SOLN
4.0000 mg | Freq: Four times a day (QID) | INTRAMUSCULAR | Status: DC | PRN
  Administered 2019-02-17 – 2019-02-18 (×2): 4 mg via INTRAVENOUS
  Filled 2019-02-16 (×2): qty 2

## 2019-02-16 MED ORDER — ACETAMINOPHEN 500 MG PO TABS
1000.0000 mg | ORAL_TABLET | Freq: Four times a day (QID) | ORAL | Status: DC | PRN
  Administered 2019-02-16: 1000 mg via ORAL
  Filled 2019-02-16: qty 2

## 2019-02-16 MED ORDER — GUAIFENESIN-DM 100-10 MG/5ML PO SYRP
5.0000 mL | ORAL_SOLUTION | ORAL | Status: DC | PRN
  Administered 2019-02-16 – 2019-02-17 (×3): 5 mL via ORAL
  Filled 2019-02-16 (×3): qty 5

## 2019-02-16 MED ORDER — METHYLPREDNISOLONE SODIUM SUCC 125 MG IJ SOLR
60.0000 mg | Freq: Four times a day (QID) | INTRAMUSCULAR | Status: DC
  Administered 2019-02-16 – 2019-02-17 (×4): 60 mg via INTRAVENOUS
  Filled 2019-02-16 (×4): qty 2

## 2019-02-16 MED ORDER — IPRATROPIUM BROMIDE 0.02 % IN SOLN: 0.5000 mg | Freq: Four times a day (QID) | RESPIRATORY_TRACT | Status: DC

## 2019-02-16 MED ORDER — COLESTIPOL HCL 1 G PO TABS: 1.0000 g | ORAL_TABLET | Freq: Two times a day (BID) | ORAL | Status: DC

## 2019-02-16 MED ORDER — PANTOPRAZOLE SODIUM 40 MG PO TBEC
40.0000 mg | DELAYED_RELEASE_TABLET | Freq: Every day | ORAL | Status: DC
  Administered 2019-02-16 – 2019-02-19 (×4): 40 mg via ORAL
  Filled 2019-02-16 (×4): qty 1

## 2019-02-16 MED ORDER — ALBUTEROL SULFATE (2.5 MG/3ML) 0.083% IN NEBU
5.0000 mg | INHALATION_SOLUTION | Freq: Once | RESPIRATORY_TRACT | Status: AC
  Administered 2019-02-16: 5 mg via RESPIRATORY_TRACT
  Filled 2019-02-16: qty 6

## 2019-02-16 MED ORDER — POTASSIUM CHLORIDE CRYS ER 20 MEQ PO TBCR
40.0000 meq | EXTENDED_RELEASE_TABLET | Freq: Once | ORAL | Status: AC
  Administered 2019-02-16: 40 meq via ORAL
  Filled 2019-02-16: qty 2

## 2019-02-16 MED ORDER — HYDROCODONE-ACETAMINOPHEN 5-325 MG PO TABS
1.0000 | ORAL_TABLET | ORAL | Status: DC | PRN
  Administered 2019-02-17: 1 via ORAL
  Administered 2019-02-18: 2 via ORAL
  Administered 2019-02-18: 1 via ORAL
  Administered 2019-02-19: 2 via ORAL
  Filled 2019-02-16 (×2): qty 2
  Filled 2019-02-16 (×2): qty 1

## 2019-02-16 MED ORDER — FUROSEMIDE 40 MG PO TABS
40.0000 mg | ORAL_TABLET | Freq: Every day | ORAL | Status: DC
  Administered 2019-02-16 – 2019-02-18 (×3): 40 mg via ORAL
  Filled 2019-02-16 (×3): qty 1

## 2019-02-16 MED ORDER — ACETAMINOPHEN 650 MG RE SUPP: 650.0000 mg | Freq: Four times a day (QID) | RECTAL | Status: DC | PRN

## 2019-02-16 MED ORDER — ALBUTEROL SULFATE (2.5 MG/3ML) 0.083% IN NEBU: 2.5000 mg | INHALATION_SOLUTION | Freq: Four times a day (QID) | RESPIRATORY_TRACT | Status: DC

## 2019-02-16 MED ORDER — ONDANSETRON HCL 4 MG PO TABS: 4.0000 mg | ORAL_TABLET | Freq: Four times a day (QID) | ORAL | Status: DC | PRN

## 2019-02-16 MED ORDER — ACETAMINOPHEN 325 MG PO TABS: 650.0000 mg | ORAL_TABLET | Freq: Four times a day (QID) | ORAL | Status: DC | PRN

## 2019-02-16 NOTE — Patient Instructions (Signed)
Not sure what is causing your symptoms.   But I am concerned that your abdomen is so tender and that you are dehydrated. I think going to the ER to get IV fluids, blood work and imaging if necessary is a good next step.

## 2019-02-16 NOTE — Progress Notes (Signed)
Subjective:     Morgan [name] is a 68 y.o. female presenting for Emesis (started on 02/13/2019. Nausea, diarrhea, cough, fatigue. No fever that she knows of. Body aches present. )     Emesis   This is a new problem. The current episode started in the past 7 days. The problem occurs more than 10 times per day. The problem has been gradually worsening. The emesis has an appearance of bile. The maximum temperature recorded prior to her arrival was 100.4 - 100.9 F. Associated symptoms include chest pain, chills, coughing, diarrhea, a fever, headaches, myalgias, URI and weight loss. Pertinent negatives include no abdominal pain or sweats. She has tried increased fluids and bed rest (alkasezter plus) for the symptoms. The treatment provided no relief.   Not doing well with keeping things down Trying to drink Gatorade w/o anything staying down Lost 4 lbs since Friday Saw cards on Friday and increased potassium and started magnesium  Diarrhea - 5-6 times yesterday No recent abx use   Review of Systems  Constitutional: Positive for chills, fever and weight loss.  Respiratory: Positive for cough.   Cardiovascular: Positive for chest pain.  Gastrointestinal: Positive for diarrhea and vomiting. Negative for abdominal pain.  Musculoskeletal: Positive for myalgias.  Neurological: Positive for headaches.     Social History   Tobacco Use  Smoking Status Former Smoker  . Packs/day: 1.00  . Years: 15.00  . Pack years: 15.00  . Types: Cigarettes  . Last attempt to quit: 09/12/2017  . Years since quitting: 1.4  Smokeless Tobacco Never Used        Objective:    BP Readings from Last 3 Encounters:  02/16/19 (!) 156/92  01/13/19 126/82  11/12/18 120/80   Wt Readings from Last 3 Encounters:  02/16/19 219 lb 12 oz (99.7 kg)  01/13/19 229 lb (103.9 kg)  11/12/18 225 lb (102.1 kg)    BP (!) 156/92   Pulse 94   Temp 98.3 F (36.8 C)   Resp 20   Ht 5' 10.5" (1.791 m)   Wt 219 lb  12 oz (99.7 kg)   SpO2 95%   BMI 31.09 kg/m    Physical Exam Constitutional:      General: She is not in acute distress.    Appearance: She is ill-appearing. She is not diaphoretic.  HENT:     Head: Normocephalic and atraumatic.     Right Ear: Tympanic membrane and ear canal normal.     Left Ear: Tympanic membrane and ear canal normal.     Nose: Congestion and rhinorrhea present.     Mouth/Throat:     Mouth: Mucous membranes are dry.     Pharynx: No posterior oropharyngeal erythema.  Eyes:     General: No scleral icterus.    Conjunctiva/sclera: Conjunctivae normal.  Neck:     Musculoskeletal: Normal range of motion.  Cardiovascular:     Rate and Rhythm: Regular rhythm. Tachycardia present.     Heart sounds: No murmur.  Pulmonary:     Effort: Pulmonary effort is normal. No respiratory distress.     Breath sounds: Rales (scattered) present. No wheezing.  Abdominal:     General: Abdomen is flat. Bowel sounds are normal. There is no distension.     Palpations: Abdomen is soft.     Tenderness: There is abdominal tenderness (worse on LLQ and left side).  Musculoskeletal: Normal range of motion.  Skin:    General: Skin is warm and dry.  Capillary Refill: Capillary refill takes less than 2 seconds.  Neurological:     Mental Status: She is alert. Mental status is at baseline.  Psychiatric:        Mood and Affect: Mood normal.           Assessment & Plan:   Problem List Items Addressed This Visit    None    Visit Diagnoses    Nausea and vomiting, intractability of vomiting not specified, unspecified vomiting type    -  Primary   Dehydration       Diarrhea of presumed infectious origin         Called report to the ER.   Pt with CHF and presenting with N/V/Diarrhea x 3 days and intermittent fevers. Suspect viral etiology, however, abdominal exam is concerning for possible abdominal pathology. Discussed outpatient work-up vs ER and patient preferred the ER which  given dehydration would likely benefit from IV fluids with monitoring given CHF diagnosis  Return if symptoms worsen or fail to improve.  [name], MD

## 2019-02-16 NOTE — H&P (Signed)
[facility]                                                                                    Patient Demographics  Morgan [name], is a 68 y.o. female  CSN: [id number]  MRN: [id number]  DOB - 1951-01-29  Admit Date - 02/16/2019  Outpatient Primary MD for the patient is [name], MD   With History of -  Past Medical History:  Diagnosis Date  . Allergy   . Anemia   . Anxiety   . Arthritis   . Depression   . GERD (gastroesophageal reflux disease)   . Hypertension   . MVP (mitral valve prolapse)       Past Surgical History:  Procedure Laterality Date  . ABDOMINAL HYSTERECTOMY    . APPENDECTOMY    . BREAST SURGERY Right 1988   tumor removal  . KNEE SURGERY  2005   2 times left  . TUBAL LIGATION      in for   Chief Complaint  Patient presents with  . Emesis  . Diarrhea     HPI  Morgan [name]  is a 68 y.o. female, with past medical history significant for COPD, not on home oxygen, history of allergies, and anemia, hypertension and mitral valve prolapse presenting with 1 week history of nausea vomiting and diarrhea worsening for the last 3 days.  Patient complaining of shortness of breath as well.  Patient reports fever and chills as well. In the emergency room work-up included negative flu test, negative chest x-ray.  Her white blood cell count was elevated at 22 .  Higher alkaline phosphatase was elevated and gallbladder ultrasound is pending.  No significant improvement with breath sounds.    Review of Systems    In addition to the HPI above, No Headache, No changes with Vision or hearing, No problems swallowing food or Liquids, No Chest pain, No dysuria, No new skin rashes or bruises, No new joints pains-aches,  No new weakness, tingling, numbness in any extremity, No recent weight gain or loss, No polyuria, polydypsia or polyphagia, No significant Mental Stressors.  A full 10 point Review of Systems was done, except as stated above,  all other Review of Systems were negative.   Social History Social History   Tobacco Use  . Smoking status: Former Smoker    Packs/day: 1.00    Years: 15.00    Pack years: 15.00    Types: Cigarettes    Last attempt to quit: 09/12/2017    Years since quitting: 1.4  . Smokeless tobacco: Never Used  Substance Use Topics  . Alcohol use: Not Currently     Family History Family History  Problem Relation Age of Onset  . Breast cancer Mother 5  . Ovarian cancer Mother 16  . Brain cancer Mother   . Hypertension Father   . Hyperlipidemia Father   . Heart disease Father   . Breast cancer Maternal Grandmother 81  . Diabetes Paternal Grandmother   . Breast cancer Paternal Grandmother 17  . Cancer Paternal Grandfather   . Breast cancer Maternal Aunt 60  . Breast cancer Maternal Aunt 60  Prior to Admission medications   Medication Sig Start Date End Date Taking? Authorizing Provider  acetaminophen (TYLENOL) 500 MG tablet Take 1,000 mg by mouth every 6 (six) hours as needed for moderate pain (pain).     [provider]  aspirin EC 325 MG EC tablet Take 1 tablet (325 mg total) by mouth daily. 10/07/17   Dustin Flock, MD  atorvastatin (LIPITOR) 40 MG tablet Take 40 mg by mouth at bedtime.    [provider]  carvedilol (COREG) 3.125 MG tablet Take 3.125 mg by mouth 2 (two) times daily with a meal.    [provider]  colestipol (COLESTID) 1 g tablet TAKE 1 TABLET (1 G TOTAL) BY MOUTH 2 (TWO) TIMES DAILY. 01/21/19   Mauri Pole, MD  cyclobenzaprine (FLEXERIL) 10 MG tablet TAKE 1 TABLET (10 MG TOTAL) BY MOUTH 2 (TWO) TIMES DAILY AS NEEDED (PAIN) 11/22/18   [provider]  esomeprazole (NEXIUM) 40 MG capsule TAKE 1 CAPSULE (40 MG TOTAL) BY MOUTH DAILY AT 12 NOON. 01/21/19   Nandigam, Kavitha V, MD  furosemide (LASIX) 40 MG tablet Take 40 mg by mouth.    [provider]  gabapentin (NEURONTIN) 400 MG capsule Take 1,200 mg by mouth at  bedtime. 10/20/18   [provider]  magnesium oxide (MAG-OX) 400 MG tablet Take by mouth. 02/13/19 02/13/20  [provider]  mirtazapine (REMERON) 45 MG tablet Take 45 mg by mouth at bedtime.     [provider]  omeprazole (PRILOSEC) 40 MG capsule Take 40 mg by mouth at bedtime.    [provider]  potassium chloride SA (K-DUR,KLOR-CON) 20 MEQ tablet Take 20 mEq by mouth 2 (two) times daily.    [provider]  spironolactone (ALDACTONE) 25 MG tablet Take 25 mg by mouth daily.    [provider]    Allergies  Allergen Reactions  . Demerol [Meperidine] Hives  . Strawberry (Diagnostic) Hives    Physical Exam  Vitals  Blood pressure (!) 164/86, pulse 90, temperature 98.6 F (37 C), temperature source Oral, resp. rate 20, height 6\' 1"  (1.854 m), weight 99.3 kg, SpO2 96 %.   1. General looks acutely ill and tired  2. Normal affect and insight, Not Suicidal or Homicidal, Awake Alert, Oriented X 3.  3. No F.N deficits, grossly, patient moving all extremities.  4. Ears and Eyes appear Normal, Conjunctivae clear, PERRLA. Moist Oral Mucosa.  5. Supple Neck, No JVD, No cervical lymphadenopathy appriciated, No Carotid Bruits.  6. Symmetrical Chest wall movement, mild scattered rhonchi.  7. RRR, No Gallops, Rubs or Murmurs, No Parasternal Heave.  8. Positive Bowel Sounds, Abdomen Soft, positive Murphy sign.  9.  No Cyanosis, Normal Skin Turgor, No Skin Rash or Bruise.  10. Good muscle tone,  joints appear normal , no effusions, Normal ROM.    Data Review  CBC Recent Labs  Lab 02/16/19 1134  WBC 22.1*  HGB 10.6*  HCT 33.7*  PLT 290  MCV 78.4*  MCH 24.7*  MCHC 31.5  RDW 14.7  LYMPHSABS 1.2  MONOABS 0.7  EOSABS 0.1  BASOSABS 0.0   ------------------------------------------------------------------------------------------------------------------  Chemistries  Recent Labs  Lab 02/16/19 1134  NA 139  K 2.2*  CL  95*  CO2 33*  GLUCOSE 128*  BUN 10  CREATININE 0.94  CALCIUM 8.5*  AST 19  ALT 12  ALKPHOS 148*  BILITOT 0.6   ------------------------------------------------------------------------------------------------------------------ estimated creatinine clearance is 80.1 mL/min (by C-G formula based on  SCr of 0.94 mg/dL). ------------------------------------------------------------------------------------------------------------------ No results for input(s): TSH, T4TOTAL, T3FREE, THYROIDAB in the last 72 hours.  Invalid input(s): FREET3   Coagulation profile No results for input(s): INR, PROTIME in the last 168 hours. ------------------------------------------------------------------------------------------------------------------- No results for input(s): DDIMER in the last 72 hours. -------------------------------------------------------------------------------------------------------------------  Cardiac Enzymes No results for input(s): CKMB, TROPONINI, MYOGLOBIN in the last 168 hours.  Invalid input(s): CK ------------------------------------------------------------------------------------------------------------------ Invalid input(s): POCBNP   ---------------------------------------------------------------------------------------------------------------  Urinalysis    Component Value Date/Time   COLORURINE YELLOW (A) 09/08/2018 1425   APPEARANCEUR HAZY (A) 09/08/2018 1425   APPEARANCEUR Cloudy 09/18/2012 2000   LABSPEC 1.010 09/08/2018 1425   LABSPEC 1.012 09/18/2012 2000   PHURINE 5.0 09/08/2018 1425   GLUCOSEU NEGATIVE 09/08/2018 1425   GLUCOSEU Negative 09/18/2012 2000   HGBUR SMALL (A) 09/08/2018 1425   BILIRUBINUR NEGATIVE 09/08/2018 1425   BILIRUBINUR Negative 09/18/2012 2000   KETONESUR NEGATIVE 09/08/2018 1425   PROTEINUR NEGATIVE 09/08/2018 1425   UROBILINOGEN 0.2 05/25/2017 1743   NITRITE NEGATIVE 09/08/2018 1425   LEUKOCYTESUR SMALL (A) 09/08/2018 1425    LEUKOCYTESUR Trace 09/18/2012 2000    ----------------------------------------------------------------------------------------------------------------     Imaging results:   Dg Chest 2 View  Result Date: 02/16/2019 CLINICAL DATA:  Productive cough, fever, shortness of Breath EXAM: CHEST - 2 VIEW COMPARISON:  12/20/2017 FINDINGS: Lingular scarring. Heart is upper limits normal in size. No acute confluent airspace opacities or effusions. No acute bony abnormality. IMPRESSION: No active cardiopulmonary disease. Electronically Signed   By: Rolm Baptise M.D.   On: 02/16/2019 12:25      Assessment & Plan  COPD exacerbation with hypoxemic respiratory failure Solu-Medrol/neb treatments Titrate oxygen  ?  Viral illness with negative flu  Leukocytosis We will check gallbladder ultrasound.  Chest x-ray is negative/ urine analysis is pending Monitor CBC in a.m.  Elevated alkaline phosphatase with positive Murphy sign Check ultrasound  History of migraine headaches Pain control  Anxiety/bipolar disorder Continue with Remeron/Neurontin  Hypertension, continue with Coreg  Hyperlipidemia continue with Lipitor  DVT Prophylaxis Lovenox  AM Labs Ordered, also please review Full Orders    Code Status full  Disposition Plan: Home  Time spent in minutes : 41 minutes  Condition GUARDED   @SIGNATURE @

## 2019-02-16 NOTE — ED Triage Notes (Signed)
Pt reports feeling bad Friday, coughing, vomiting, diarrhea over the weekend. Pt reports subjective fever and generalized weakness. Pt alert, oriented x4.

## 2019-02-16 NOTE — ED Notes (Signed)
ED TO INPATIENT HANDOFF REPORT  ED Nurse Name and Phone #: Hassan Rowan RN [phone]  S Name/Age/Gender Morgan [name] 68 y.o. female Room/Bed: TRAAC/TRAAC  Code Status   Code Status: Prior  Home/SNF/Other Home Patient oriented to: self, place, time and situation Is this baseline? Yes   Triage Complete: Triage complete  Chief Complaint Dehydration  Triage Note Pt reports feeling bad Friday, coughing, vomiting, diarrhea over the weekend. Pt reports subjective fever and generalized weakness. Pt alert, oriented x4.    Allergies Allergies  Allergen Reactions  . Demerol [Meperidine] Hives  . Strawberry (Diagnostic) Hives    Level of Care/Admitting Diagnosis ED Disposition    ED Disposition Condition Comment   Admit  [facility] [[id number]]  Level of Care: Med-Surg [16]  Diagnosis: COPD exacerbation [facility]) [[id number]]  Admitting Physician: Merton Border Marshal.Browner  Attending Physician: HIJAZI, Lloyd  Estimated length of stay: past midnight tomorrow  Certification:: I certify this patient will need inpatient services for at least 2 midnights  PT Class (Do Not Modify): Inpatient [101]  PT Acc Code (Do Not Modify): Private [1]       B Medical/Surgery History Past Medical History:  Diagnosis Date  . Allergy   . Anemia   . Anxiety   . Arthritis   . Depression   . GERD (gastroesophageal reflux disease)   . Hypertension   . MVP (mitral valve prolapse)    Past Surgical History:  Procedure Laterality Date  . ABDOMINAL HYSTERECTOMY    . APPENDECTOMY    . BREAST SURGERY Right 1988   tumor removal  . KNEE SURGERY  2005   2 times left  . TUBAL LIGATION       A IV Location/Drains/Wounds Patient Lines/Drains/Airways Status   Active Line/Drains/Airways    Name:   Placement date:   Placement time:   Site:   Days:   Peripheral IV 02/16/19 Right Hand   02/16/19    1035    Hand   less than 1          Intake/Output Last 24 hours No intake or  output data in the 24 hours ending 02/16/19 1650  Labs/Imaging Results for orders placed or performed during the hospital encounter of 02/16/19 (from the past 48 hour(s))  Comprehensive metabolic panel     Status: Abnormal   Collection Time: 02/16/19 11:34 AM  Result Value Ref Range   Sodium 139 135 - 145 mmol/L   Potassium 2.2 (LL) 3.5 - 5.1 mmol/L    Comment: CRITICAL RESULT CALLED TO, READ BACK BY AND VERIFIED WITH: M.BRADLEY,RN 1347 02/16/2019 CLARK,S    Chloride 95 (L) 98 - 111 mmol/L   CO2 33 (H) 22 - 32 mmol/L   Glucose, Bld 128 (H) 70 - 99 mg/dL   BUN 10 8 - 23 mg/dL   Creatinine, Ser 0.94 0.44 - 1.00 mg/dL   Calcium 8.5 (L) 8.9 - 10.3 mg/dL   Total Protein 6.4 (L) 6.5 - 8.1 g/dL   Albumin 2.8 (L) 3.5 - 5.0 g/dL   AST 19 15 - 41 U/L   ALT 12 0 - 44 U/L   Alkaline Phosphatase 148 (H) 38 - 126 U/L   Total Bilirubin 0.6 0.3 - 1.2 mg/dL   GFR calc non Af Amer >60 >60 mL/min   GFR calc Af Amer >60 >60 mL/min   Anion gap 11 5 - 15    Comment: Performed at [facility], [address],  [place], [place] [address]  CBC with Differential     Status: Abnormal   Collection Time: 02/16/19 11:34 AM  Result Value Ref Range   WBC 22.1 (H) 4.0 - 10.5 K/uL   RBC 4.30 3.87 - 5.11 MIL/uL   Hemoglobin 10.6 (L) 12.0 - 15.0 g/dL   HCT 33.7 (L) 36.0 - 46.0 %   MCV 78.4 (L) 80.0 - 100.0 fL   MCH 24.7 (L) 26.0 - 34.0 pg   MCHC 31.5 30.0 - 36.0 g/dL   RDW 14.7 11.5 - 15.5 %   Platelets 290 150 - 400 K/uL   nRBC 0.0 0.0 - 0.2 %   Neutrophils Relative % 90 %   Neutro Abs 19.9 (H) 1.7 - 7.7 K/uL   Lymphocytes Relative 6 %   Lymphs Abs 1.2 0.7 - 4.0 K/uL   Monocytes Relative 3 %   Monocytes Absolute 0.7 0.1 - 1.0 K/uL   Eosinophils Relative 0 %   Eosinophils Absolute 0.1 0.0 - 0.5 K/uL   Basophils Relative 0 %   Basophils Absolute 0.0 0.0 - 0.1 K/uL   Immature Granulocytes 1 %   Abs Immature Granulocytes 0.21 (H) 0.00 - 0.07 K/uL    Comment: Performed at [facility] [address] [place], [place] [address]  Influenza panel by PCR (type A & B)     Status: None   Collection Time: 02/16/19 11:35 AM  Result Value Ref Range   Influenza A By PCR NEGATIVE NEGATIVE   Influenza B By PCR NEGATIVE NEGATIVE    Comment: (NOTE) The Xpert Xpress Flu assay is intended as an aid in the diagnosis of  influenza and should not be used as a sole basis for treatment.  This  assay is FDA approved for nasopharyngeal swab specimens only. Nasal  washings and aspirates are unacceptable for Xpert Xpress Flu testing. Performed at [facility], [place] [address], [place], [place] [address]    Dg Chest 2 View  Result Date: 02/16/2019 CLINICAL DATA:  Productive cough, fever, shortness of Breath EXAM: CHEST - 2 VIEW COMPARISON:  12/20/2017 FINDINGS: Lingular scarring. Heart is upper limits normal in size. No acute confluent airspace opacities or effusions. No acute bony abnormality. IMPRESSION: No active cardiopulmonary disease. Electronically Signed   By: Rolm Baptise M.D.   On: 02/16/2019 12:25   US Abdomen Limited Ruq  Result Date: 02/16/2019 CLINICAL DATA:  Nausea and vomiting. Right upper quadrant pain for 5 days. EXAM: ULTRASOUND ABDOMEN LIMITED RIGHT UPPER QUADRANT COMPARISON:  None. FINDINGS: Gallbladder: A single mobile shadowing stone measures up to 1.0 cm. The patient does exhibit a sonographic Murphy's sign with tenderness over the gallbladder. There is no gallbladder wall thickening. Common bile duct: Diameter: 4.7 mm, within normal limits Liver: No focal lesion identified. Within normal limits in parenchymal echogenicity. Portal vein is patent on color Doppler imaging with normal direction of blood flow towards the liver. IMPRESSION: 1. Cholelithiasis.  A single shadowing stone measures up to 10 mm. 2. Positive sonographic Murphy sign. In the setting of cholelithiasis, this raises concern for early cholecystitis despite absence of gallbladder wall thickening. Electronically  Signed   By: San Morelle M.D.   On: 02/16/2019 15:56    Pending Labs Unresulted Labs (From admission, onward)    Start     Ordered   02/16/19 1134  Urinalysis, Routine w reflex microscopic  ONCE - STAT,   STAT     02/16/19 1134   Signed and Held  HIV antibody (Routine Testing)  Once,   R     Signed and Held   Signed and Held  Basic metabolic panel  Tomorrow morning,   R     Signed and Held          Vitals/Pain Today's Vitals   02/16/19 1019 02/16/19 1022 02/16/19 1437 02/16/19 1639  BP:  (!) 164/86  (!) 150/94  Pulse:  90  97  Resp:  20  16  Temp:  98.6 F (37 C)    TempSrc:  Oral    SpO2:  96% 91% 94%  Weight: 99.3 kg     Height: 6\' 1"  (1.854 m)     PainSc: 5        Isolation Precautions No active isolations  Medications Medications  potassium chloride 10 mEq in 100 mL IVPB (has no administration in time range)  acetaminophen (TYLENOL) tablet 1,000 mg (has no administration in time range)  gabapentin (NEURONTIN) capsule 400 mg (400 mg Oral Not Given 02/16/19 1641)  albuterol (PROVENTIL) (2.5 MG/3ML) 0.083% nebulizer solution 5 mg (5 mg Nebulization Given 02/16/19 1433)    Mobility walks with device Low fall risk   Focused Assessments Cardiac Assessment Handoff:  Cardiac Rhythm: Normal sinus rhythm Lab Results  Component Value Date   TROPONINI <0.03 10/16/2018   No results found for: DDIMER Does the Patient currently have chest pain? No  , Pulmonary Assessment Handoff:  Lung sounds: Bilateral Breath Sounds: Coarse crackles, Inspiratory wheezes L Breath Sounds: Inspiratory wheezes, Coarse crackles R Breath Sounds: Coarse crackles, Inspiratory wheezes O2 Device: Room Air        R Recommendations: See Admitting Provider Note  Report given to:   Additional Notes:  Low K

## 2019-02-17 DIAGNOSIS — I5032 Chronic diastolic (congestive) heart failure: Secondary | ICD-10-CM

## 2019-02-17 DIAGNOSIS — E785 Hyperlipidemia, unspecified: Secondary | ICD-10-CM

## 2019-02-17 DIAGNOSIS — E876 Hypokalemia: Secondary | ICD-10-CM

## 2019-02-17 LAB — BASIC METABOLIC PANEL
Anion gap: 12 (ref 5–15)
BUN: 8 mg/dL (ref 8–23)
CO2: 31 mmol/L (ref 22–32)
CREATININE: 1.01 mg/dL — AB (ref 0.44–1.00)
Calcium: 8.1 mg/dL — ABNORMAL LOW (ref 8.9–10.3)
Chloride: 97 mmol/L — ABNORMAL LOW (ref 98–111)
GFR calc Af Amer: >60 mL/min (ref >60)
GFR, EST NON AFRICAN AMERICAN: 58 mL/min — AB (ref >60)
Glucose, Bld: 174 mg/dL — ABNORMAL HIGH (ref 70–99)
Potassium: 2.6 mmol/L — CL (ref 3.5–5.1)
Sodium: 140 mmol/L (ref 135–145)

## 2019-02-17 LAB — HIV ANTIBODY (ROUTINE TESTING W REFLEX): HIV Screen 4th Generation wRfx: NONREACTIVE

## 2019-02-17 LAB — MAGNESIUM: Magnesium: 1.6 mg/dL — ABNORMAL LOW (ref 1.7–2.4)

## 2019-02-17 MED ORDER — METHYLPREDNISOLONE SODIUM SUCC 40 MG IJ SOLR
40.0000 mg | Freq: Four times a day (QID) | INTRAMUSCULAR | Status: DC
  Administered 2019-02-18 – 2019-02-19 (×6): 40 mg via INTRAVENOUS
  Filled 2019-02-17 (×6): qty 1

## 2019-02-17 MED ORDER — MAGNESIUM SULFATE 2 GM/50ML IV SOLN
2.0000 g | Freq: Once | INTRAVENOUS | Status: AC
  Administered 2019-02-17: 2 g via INTRAVENOUS
  Filled 2019-02-17: qty 50

## 2019-02-17 MED ORDER — POTASSIUM CHLORIDE CRYS ER 20 MEQ PO TBCR
40.0000 meq | EXTENDED_RELEASE_TABLET | ORAL | Status: AC
  Administered 2019-02-17 (×2): 40 meq via ORAL
  Filled 2019-02-17 (×2): qty 2

## 2019-02-17 MED ORDER — GUAIFENESIN-DM 100-10 MG/5ML PO SYRP
10.0000 mL | ORAL_SOLUTION | ORAL | Status: DC | PRN
  Administered 2019-02-17 – 2019-02-19 (×6): 10 mL via ORAL
  Filled 2019-02-17 (×6): qty 10

## 2019-02-17 NOTE — Progress Notes (Signed)
PROGRESS NOTE    Morgan [name]  LPF:[id number] DOB: 1951/12/14 DOA: 02/16/2019 PCP: [name], MD    Brief Narrative:  68 year old female who presented with diarrhea and emesis.  He does have significant past medical history for COPD, anemia, hypertension, diastolic heart failure with mild mitral valve prolapse.  Reported 1 week history of nausea, vomiting and diarrhea, evolved into dyspnea for the last 3 days, associated with fevers and chills.  On her initial physical examination blood pressure was 164/86, heart rate 90, respiratory rate 20, oxygen saturation 96%, temperature 37 C.  Her lungs had mild scattered rhonchi, heart S1-S2 present with me, abdomen was soft, protuberant, no lower extremity edema.  Sodium 139, potassium 2.2, chloride 95, bicarb 33, glucose 128, BUN 10, creatinine 0.4, white count 22.1, hemoglobin 9.6, hematocrit 3.7, platelets 290, 90% neutrophils.  Influenza A/B negative.  Her urinalysis had 25-50 white cells, specific gravity 1.024, her chest x-ray was hypoinflated, no infiltrates.  Patient was admitted to the hospital with a working diagnosis of acute COPD exacerbation complicated by hypokalemia and leukocytosis.  Assessment & Plan:   Active Problems:   COPD exacerbation ([place])   1. Acute COPD exacerbation. Patient continue to have dyspnea, not yet back to baseline, will continue aggressive bronchodilator therapy, supplemental 02 per [place] and oxymetry monitoring. Will decrease dose of steroids. Considering gastrointestinal symptoms, possible viral triggered COPD exacerbation.   2. Hypokalemia and hypomagnesemia. Likely due to gastrointestinal losses, will continue correction with Kcl and Mg, follow on renal panel in am. Kidney function is preserved with serum cr at 1.10. Today's K is 2,6 and Mg at 1,6.   3. Chronic diastolic heart failure with mild mitral regurgitation. Will continue furosemide per home regimen, stop IV fluids, keep negative fluids balance. Old  records personally reviewed, she follows with Cardiology at [facility], has been on torsemide before. No clinical signs of acute decompensation.   4. Dyslipidemia. Continue statin therapy with atorvastatin.   5. Depression. Continue mirtazapine.   DVT prophylaxis: enoxaparin   Code Status:  full Family Communication: no family at the bedside Disposition Plan/ discharge barriers:  Pending clinical improvement.   Body mass index is 28.89 kg/m. Malnutrition Type:      Malnutrition Characteristics:      Nutrition Interventions:     RN Pressure Injury Documentation:     Consultants:     Procedures:     Antimicrobials:       Subjective: Patient continue to feel very poorly, positive cough and dyspnea, reports having nausea and diarrhea for last few days prior to hospitalization with decreased po intake. No using bronchodilators at home.   Objective: Vitals:   02/16/19 2332 02/17/19 0036 02/17/19 0716 02/17/19 0807  BP: (!) 156/100 131/86  122/81  Pulse: 99 81  88  Resp:      Temp: 98.3 F (36.8 C)   98.1 F (36.7 C)  TempSrc: Oral   Oral  SpO2: 95%  95% 96%  Weight:      Height:        Intake/Output Summary (Last 24 hours) at 02/17/2019 1259 Last data filed at 02/17/2019 0800 Gross per 24 hour  Intake 1491 ml  Output 300 ml  Net 1191 ml   Filed Weights   02/16/19 1019  Weight: 99.3 kg    Examination:   General: deconditioned and ill looking appearing  Neurology: Awake and alert, non focal  E ENT: positive pallor, no icterus, oral mucosa moist Cardiovascular: No JVD. S1-S2 present,  rhythmic, no gallops, rubs, or murmurs. Trace lower extremity edema. Pulmonary: positive breath sounds bilaterally, decreased air movement, no significant wheezing or rhonchi, positive bilateral rales. Gastrointestinal. Abdomen with no organomegaly, non tender, no rebound or guarding Skin. No rashes Musculoskeletal: no joint deformities     Data Reviewed: I have  personally reviewed following labs and imaging studies  CBC: Recent Labs  Lab 02/16/19 1134  WBC 22.1*  NEUTROABS 19.9*  HGB 10.6*  HCT 33.7*  MCV 78.4*  PLT 786   Basic Metabolic Panel: Recent Labs  Lab 02/16/19 1134 02/17/19 0210  NA 139 140  K 2.2* 2.6*  CL 95* 97*  CO2 33* 31  GLUCOSE 128* 174*  BUN 10 8  CREATININE 0.94 1.01*  CALCIUM 8.5* 8.1*  MG  --  1.6*   GFR: Estimated Creatinine Clearance: 74.5 mL/min (A) (by C-G formula based on SCr of 1.01 mg/dL (H)). Liver Function Tests: Recent Labs  Lab 02/16/19 1134  AST 19  ALT 12  ALKPHOS 148*  BILITOT 0.6  PROT 6.4*  ALBUMIN 2.8*   No results for input(s): LIPASE, AMYLASE in the last 168 hours. No results for input(s): AMMONIA in the last 168 hours. Coagulation Profile: No results for input(s): INR, PROTIME in the last 168 hours. Cardiac Enzymes: No results for input(s): CKTOTAL, CKMB, CKMBINDEX, TROPONINI in the last 168 hours. BNP (last 3 results) No results for input(s): PROBNP in the last 8760 hours. HbA1C: No results for input(s): HGBA1C in the last 72 hours. CBG: No results for input(s): GLUCAP in the last 168 hours. Lipid Profile: No results for input(s): CHOL, HDL, LDLCALC, TRIG, CHOLHDL, LDLDIRECT in the last 72 hours. Thyroid Function Tests: No results for input(s): TSH, T4TOTAL, FREET4, T3FREE, THYROIDAB in the last 72 hours. Anemia Panel: No results for input(s): VITAMINB12, FOLATE, FERRITIN, TIBC, IRON, RETICCTPCT in the last 72 hours.    Radiology Studies: I have reviewed all of the imaging during this hospital visit personally     Scheduled Meds: . aspirin  325 mg Oral Daily  . atorvastatin  40 mg Oral QHS  . enoxaparin (LOVENOX) injection  40 mg Subcutaneous Q24H  . furosemide  40 mg Oral Daily  . gabapentin  400 mg Oral TID  . ipratropium-albuterol  3 mL Nebulization TID  . methylPREDNISolone (SOLU-MEDROL) injection  60 mg Intravenous Q6H  . mirtazapine  45 mg Oral QHS    . pantoprazole  40 mg Oral Daily   Continuous Infusions:   LOS: 1 day        [name], MD

## 2019-02-17 NOTE — Progress Notes (Signed)
CRITICAL VALUE ALERT  Critical Value: Potassium 2.6  Date & Time Notied:  02/17/2019  :  0303  Provider Notified: Tylene Fantasia, NP  Orders Received/Actions taken: See new orders

## 2019-02-17 NOTE — ED Provider Notes (Signed)
[facility] Provider Note   CSN: [id number] Arrival date & time: 02/16/19  1013    History   Chief Complaint Chief Complaint  Patient presents with  . Emesis  . Diarrhea    HPI Morgan [name] is a 68 y.o. female.     HPI Patient presents to the emergency department with nausea vomiting along with coughing and body aches over the last 3 days.  The patient states she was seen by her doctor today and they were sent her to the emergency department.  The patient states that she does have a history of COPD she feels like that is exacerbated by this upper respiratory illness.  The patient denies chest pain, shortness of breath, headache,blurred vision, neck pain, weakness, numbness, dizziness, anorexia, edema, abdominal pain, nausea, vomiting, diarrhea, rash, back pain, dysuria, hematemesis, bloody stool, near syncope, or syncope. Past Medical History:  Diagnosis Date  . Allergy   . Anemia   . Anxiety   . Arthritis   . Depression   . GERD (gastroesophageal reflux disease)   . Hypertension   . MVP (mitral valve prolapse)     Patient Active Problem List   Diagnosis Date Noted  . COPD exacerbation ([place]) 02/16/2019  . CKD (chronic kidney disease) stage 3, GFR 30-59 ml/min ([facility]) 01/13/2019  . CHF (congestive heart failure) ([place]) 01/13/2019  . Dizziness 01/13/2019  . Hoarseness 11/12/2018  . Oropharyngeal dysphagia 11/12/2018  . Sleepiness 11/12/2018  . CVA (cerebral vascular accident) ([place]) 08/22/2018  . Left-sided weakness 10/04/2017  . HLD (hyperlipidemia) 10/04/2017  . GERD (gastroesophageal reflux disease) 10/04/2017  . Anxiety 08/26/2017  . Essential hypertension   . Depression   . Hypokalemia 02/22/2015  . Chronic lower back pain 04/29/2013  . Paresthesia 04/29/2013    Past Surgical History:  Procedure Laterality Date  . ABDOMINAL HYSTERECTOMY    . APPENDECTOMY    . BREAST SURGERY Right 1988   tumor removal  . KNEE SURGERY  2005   2 times  left  . TUBAL LIGATION       OB History   No obstetric history on file.      Home Medications    Prior to Admission medications   Medication Sig Start Date End Date Taking? Authorizing Provider  acetaminophen (TYLENOL) 500 MG tablet Take 1,000 mg by mouth every 6 (six) hours as needed (pain).    Yes [provider]  aspirin EC 325 MG EC tablet Take 1 tablet (325 mg total) by mouth daily. 10/07/17  Yes Dustin Flock, MD  atorvastatin (LIPITOR) 40 MG tablet Take 40 mg by mouth at bedtime.   Yes [provider]  esomeprazole (NEXIUM) 40 MG capsule TAKE 1 CAPSULE (40 MG TOTAL) BY MOUTH DAILY AT 12 NOON. Patient taking differently: Take 40 mg by mouth at bedtime.  01/21/19  Yes Nandigam, Venia Minks, MD  furosemide (LASIX) 40 MG tablet Take 40 mg by mouth daily.    Yes [provider]  gabapentin (NEURONTIN) 400 MG capsule Take 400 mg by mouth 3 (three) times daily.  10/20/18  Yes [provider]  magnesium oxide (MAG-OX) 400 MG tablet Take 1,200 mg by mouth 2 (two) times daily.  02/13/19 02/13/20 Yes [provider]  mirtazapine (REMERON) 45 MG tablet Take 45 mg by mouth at bedtime.    Yes [provider]  potassium chloride (K-DUR,KLOR-CON) 10 MEQ tablet Take 30 mEq by mouth 2 (two) times daily.    Yes [provider]  colestipol (COLESTID) 1 g tablet TAKE 1 TABLET (1 G TOTAL) BY MOUTH 2 (TWO) TIMES DAILY. Patient not taking: Reported on 02/16/2019 01/21/19   Mauri Pole, MD    Family History Family History  Problem Relation Age of Onset  . Breast cancer Mother 2  . Ovarian cancer Mother 61  . Brain cancer Mother   . Hypertension Father   . Hyperlipidemia Father   . Heart disease Father   . Breast cancer Maternal Grandmother 65  . Diabetes Paternal Grandmother   . Breast cancer Paternal Grandmother 64  . Cancer Paternal Grandfather   . Breast cancer Maternal Aunt 60  . Breast cancer Maternal Aunt 60    Social  History Social History   Tobacco Use  . Smoking status: Former Smoker    Packs/day: 1.00    Years: 15.00    Pack years: 15.00    Types: Cigarettes    Last attempt to quit: 09/12/2017    Years since quitting: 1.4  . Smokeless tobacco: Never Used  Substance Use Topics  . Alcohol use: Not Currently  . Drug use: No     Allergies   Demerol [meperidine] and Strawberry (diagnostic)   Review of Systems Review of Systems All other systems negative except as documented in the HPI. All pertinent positives and negatives as reviewed in the HPI.  Physical Exam Updated Vital Signs BP 122/81   Pulse 88   Temp 98.1 F (36.7 C) (Oral)   Resp 16   Ht 6\' 1"  (1.854 m)   Wt 99.3 kg   SpO2 97%   BMI 28.89 kg/m   Physical Exam Vitals signs and nursing note reviewed.  Constitutional:      General: She is not in acute distress.    Appearance: She is well-developed.  HENT:     Head: Normocephalic and atraumatic.  Eyes:     Pupils: Pupils are equal, round, and reactive to light.  Neck:     Musculoskeletal: Normal range of motion and neck supple.  Cardiovascular:     Rate and Rhythm: Normal rate and regular rhythm.     Heart sounds: Normal heart sounds. No murmur. No friction rub. No gallop.   Pulmonary:     Effort: Pulmonary effort is normal. Tachypnea present. No respiratory distress.     Breath sounds: No stridor. Wheezing present. No rhonchi.  Abdominal:     General: Bowel sounds are normal. There is no distension.     Palpations: Abdomen is soft.     Tenderness: There is no abdominal tenderness.  Skin:    General: Skin is warm and dry.     Capillary Refill: Capillary refill takes less than 2 seconds.     Findings: No erythema or rash.  Neurological:     Mental Status: She is alert and oriented to person, place, and time.     Motor: No abnormal muscle tone.     Coordination: Coordination normal.  Psychiatric:        Behavior: Behavior normal.      ED Treatments /  Results  Labs (all labs ordered are listed, but only abnormal results are displayed) Labs Reviewed  COMPREHENSIVE METABOLIC PANEL - Abnormal; Notable for the following components:      Result Value   Potassium 2.2 (*)    Chloride 95 (*)    CO2 33 (*)    Glucose, Bld 128 (*)    Calcium 8.5 (*)    Total Protein 6.4 (*)  Albumin 2.8 (*)    Alkaline Phosphatase 148 (*)    All other components within normal limits  CBC WITH DIFFERENTIAL/PLATELET - Abnormal; Notable for the following components:   WBC 22.1 (*)    Hemoglobin 10.6 (*)    HCT 33.7 (*)    MCV 78.4 (*)    MCH 24.7 (*)    Neutro Abs 19.9 (*)    Abs Immature Granulocytes 0.21 (*)    All other components within normal limits  URINALYSIS, ROUTINE W REFLEX MICROSCOPIC - Abnormal; Notable for the following components:   APPearance HAZY (*)    Hgb urine dipstick MODERATE (*)    Protein, ur 100 (*)    Leukocytes,Ua MODERATE (*)    Bacteria, UA MANY (*)    All other components within normal limits  BASIC METABOLIC PANEL - Abnormal; Notable for the following components:   Potassium 2.6 (*)    Chloride 97 (*)    Glucose, Bld 174 (*)    Creatinine, Ser 1.01 (*)    Calcium 8.1 (*)    GFR calc non Af Amer 58 (*)    All other components within normal limits  MAGNESIUM - Abnormal; Notable for the following components:   Magnesium 1.6 (*)    All other components within normal limits  INFLUENZA PANEL BY PCR (TYPE A & B)  HIV ANTIBODY (ROUTINE TESTING W REFLEX)    EKG None  Radiology Dg Chest 2 View  Result Date: 02/16/2019 CLINICAL DATA:  Productive cough, fever, shortness of Breath EXAM: CHEST - 2 VIEW COMPARISON:  12/20/2017 FINDINGS: Lingular scarring. Heart is upper limits normal in size. No acute confluent airspace opacities or effusions. No acute bony abnormality. IMPRESSION: No active cardiopulmonary disease. Electronically Signed   By: Rolm Baptise M.D.   On: 02/16/2019 12:25   US Abdomen Limited Ruq  Result Date:  02/16/2019 CLINICAL DATA:  Nausea and vomiting. Right upper quadrant pain for 5 days. EXAM: ULTRASOUND ABDOMEN LIMITED RIGHT UPPER QUADRANT COMPARISON:  None. FINDINGS: Gallbladder: A single mobile shadowing stone measures up to 1.0 cm. The patient does exhibit a sonographic Murphy's sign with tenderness over the gallbladder. There is no gallbladder wall thickening. Common bile duct: Diameter: 4.7 mm, within normal limits Liver: No focal lesion identified. Within normal limits in parenchymal echogenicity. Portal vein is patent on color Doppler imaging with normal direction of blood flow towards the liver. IMPRESSION: 1. Cholelithiasis.  A single shadowing stone measures up to 10 mm. 2. Positive sonographic Murphy sign. In the setting of cholelithiasis, this raises concern for early cholecystitis despite absence of gallbladder wall thickening. Electronically Signed   By: San Morelle M.D.   On: 02/16/2019 15:56    Procedures Procedures (including critical care time)  Medications Ordered in ED Medications  potassium chloride 10 mEq in 100 mL IVPB (has no administration in time range)  acetaminophen (TYLENOL) tablet 1,000 mg (1,000 mg Oral Given 02/16/19 1655)  aspirin EC tablet 325 mg (325 mg Oral Given 02/17/19 0957)  atorvastatin (LIPITOR) tablet 40 mg (40 mg Oral Given 02/16/19 2256)  furosemide (LASIX) tablet 40 mg (40 mg Oral Given 02/17/19 0957)  mirtazapine (REMERON) tablet 45 mg (45 mg Oral Given 02/16/19 2256)  pantoprazole (PROTONIX) EC tablet 40 mg (40 mg Oral Given 02/17/19 0957)  gabapentin (NEURONTIN) capsule 400 mg (400 mg Oral Given 02/17/19 1530)  enoxaparin (LOVENOX) injection 40 mg (40 mg Subcutaneous Given 02/16/19 1846)  acetaminophen (TYLENOL) tablet 650 mg (has no administration in time range)  Or  acetaminophen (TYLENOL) suppository 650 mg (has no administration in time range)  HYDROcodone-acetaminophen (NORCO/VICODIN) 5-325 MG per tablet 1-2 tablet (has no administration in time  range)  ondansetron (ZOFRAN) tablet 4 mg (has no administration in time range)    Or  ondansetron (ZOFRAN) injection 4 mg (has no administration in time range)  albuterol (PROVENTIL) (2.5 MG/3ML) 0.083% nebulizer solution 2.5 mg (has no administration in time range)  ipratropium-albuterol (DUONEB) 0.5-2.5 (3) MG/3ML nebulizer solution 3 mL (3 mLs Nebulization Given 02/17/19 1410)  guaiFENesin-dextromethorphan (ROBITUSSIN DM) 100-10 MG/5ML syrup 10 mL (10 mLs Oral Given 02/17/19 1524)  methylPREDNISolone sodium succinate (SOLU-MEDROL) 40 mg/mL injection 40 mg (has no administration in time range)  albuterol (PROVENTIL) (2.5 MG/3ML) 0.083% nebulizer solution 5 mg (5 mg Nebulization Given 02/16/19 1433)  potassium chloride SA (K-DUR,KLOR-CON) CR tablet 40 mEq (40 mEq Oral Given 02/16/19 1845)  potassium chloride SA (K-DUR,KLOR-CON) CR tablet 40 mEq (40 mEq Oral Given 02/16/19 1742)  potassium chloride SA (K-DUR,KLOR-CON) CR tablet 40 mEq (40 mEq Oral Given 02/17/19 0748)  magnesium sulfate IVPB 2 g 50 mL (2 g Intravenous New Bag/Given 02/17/19 1113)     Initial Impression / Assessment and Plan / ED Course  I have reviewed the triage vital signs and the nursing notes.  Pertinent labs & imaging results that were available during my care of the patient were reviewed by me and considered in my medical decision making (see chart for details).       Patient will need admission to the hospital for COPD exacerbation along with low potassium.  The patient is somewhat improved after an hour-long neb treatment but not significantly.  Final Clinical Impressions(s) / ED Diagnoses   Final diagnoses:  Nausea & vomiting    ED Discharge Orders    None       Dalia Heading, PA-C 02/17/19 1533    Valarie Merino, MD 02/19/19 2032

## 2019-02-18 ENCOUNTER — Inpatient Hospital Stay (HOSPITAL_COMMUNITY): Payer: Medicare HMO

## 2019-02-18 DIAGNOSIS — R1084 Generalized abdominal pain: Secondary | ICD-10-CM

## 2019-02-18 DIAGNOSIS — F329 Major depressive disorder, single episode, unspecified: Secondary | ICD-10-CM

## 2019-02-18 DIAGNOSIS — R112 Nausea with vomiting, unspecified: Secondary | ICD-10-CM

## 2019-02-18 DIAGNOSIS — R197 Diarrhea, unspecified: Secondary | ICD-10-CM

## 2019-02-18 DIAGNOSIS — I509 Heart failure, unspecified: Secondary | ICD-10-CM

## 2019-02-18 LAB — HEPATIC FUNCTION PANEL
ALT: 19 U/L (ref 0–44)
AST: 41 U/L (ref 15–41)
Albumin: 2.4 g/dL — ABNORMAL LOW (ref 3.5–5.0)
Alkaline Phosphatase: 127 U/L — ABNORMAL HIGH (ref 38–126)
BILIRUBIN TOTAL: 0.7 mg/dL (ref 0.3–1.2)
Bilirubin, Direct: 0.4 mg/dL — ABNORMAL HIGH (ref 0.0–0.2)
Indirect Bilirubin: 0.3 mg/dL (ref 0.3–0.9)
Total Protein: 6.3 g/dL — ABNORMAL LOW (ref 6.5–8.1)

## 2019-02-18 LAB — CBC
HCT: 33.2 % — ABNORMAL LOW (ref 36.0–46.0)
Hemoglobin: 10.8 g/dL — ABNORMAL LOW (ref 12.0–15.0)
MCH: 25.2 pg — ABNORMAL LOW (ref 26.0–34.0)
MCHC: 32.5 g/dL (ref 30.0–36.0)
MCV: 77.6 fL — ABNORMAL LOW (ref 80.0–100.0)
Platelets: 213 10*3/uL (ref 150–400)
RBC: 4.28 MIL/uL (ref 3.87–5.11)
RDW: 14.8 % (ref 11.5–15.5)
WBC: 19.3 10*3/uL — ABNORMAL HIGH (ref 4.0–10.5)
nRBC: 0 % (ref 0.0–0.2)

## 2019-02-18 LAB — BASIC METABOLIC PANEL
Anion gap: 12 (ref 5–15)
BUN: 11 mg/dL (ref 8–23)
CO2: 29 mmol/L (ref 22–32)
Calcium: 8.4 mg/dL — ABNORMAL LOW (ref 8.9–10.3)
Chloride: 97 mmol/L — ABNORMAL LOW (ref 98–111)
Creatinine, Ser: 1.18 mg/dL — ABNORMAL HIGH (ref 0.44–1.00)
GFR calc Af Amer: 56 mL/min — ABNORMAL LOW (ref >60)
GFR calc non Af Amer: 48 mL/min — ABNORMAL LOW (ref >60)
GLUCOSE: 141 mg/dL — AB (ref 70–99)
Potassium: 2.6 mmol/L — CL (ref 3.5–5.1)
Sodium: 138 mmol/L (ref 135–145)

## 2019-02-18 LAB — MAGNESIUM: Magnesium: 2 mg/dL (ref 1.7–2.4)

## 2019-02-18 LAB — LIPASE, BLOOD: Lipase: 29 U/L (ref 11–51)

## 2019-02-18 MED ORDER — POTASSIUM CHLORIDE CRYS ER 20 MEQ PO TBCR
40.0000 meq | EXTENDED_RELEASE_TABLET | Freq: Once | ORAL | Status: AC
  Administered 2019-02-18: 40 meq via ORAL
  Filled 2019-02-18: qty 2

## 2019-02-18 MED ORDER — IPRATROPIUM-ALBUTEROL 0.5-2.5 (3) MG/3ML IN SOLN
3.0000 mL | Freq: Two times a day (BID) | RESPIRATORY_TRACT | Status: DC
  Administered 2019-02-18 – 2019-02-19 (×2): 3 mL via RESPIRATORY_TRACT
  Filled 2019-02-18: qty 3

## 2019-02-18 MED ORDER — POTASSIUM CHLORIDE CRYS ER 20 MEQ PO TBCR
40.0000 meq | EXTENDED_RELEASE_TABLET | ORAL | Status: AC
  Administered 2019-02-18 (×2): 40 meq via ORAL
  Filled 2019-02-18 (×2): qty 2

## 2019-02-18 MED ORDER — TECHNETIUM TC 99M MEBROFENIN IV KIT
5.3000 | PACK | Freq: Once | INTRAVENOUS | Status: AC | PRN
  Administered 2019-02-18: 5.3 via INTRAVENOUS

## 2019-02-18 NOTE — Progress Notes (Signed)
HIDA scan completed:  Normal hepatobiliary scan. No scintigraphic evidence of acute cholecystitis or biliary obstruction.  I discussed with Dr. Lonny Prude.

## 2019-02-18 NOTE — Consult Note (Signed)
[facility] Consult Note  Morgan [name] 28-Aug-1951  [id number].    Requesting MD: Lonny Prude  Chief Complaint/Reason for Consult: RUQ pain  HPI:  Patient is a 68 year old female who was admitted to the hospital yesterday with 5 day history of nausea, vomiting, abdominal pain, diarrhea, cough and increased shortness of breath. Associated body aches, fever and chills. Patient reports abdominal pain is in the RUQ and is constant. Vomits every time she eats. Has been having small bouts of diarrhea but denies bloody or melenotic stools. Patient reports she has never experienced symptoms like this in the past. Cough feels like it is productive but sputum gets stuck in her throat. She also reports decreased urination and foul smelling urine but denies dysuria.   PMH significant for COPD, chronic CHF with mitral regurgitation, HTN, GERD, Depression and anxiety. Patient takes 325 mg ASA daily, no other blood thinning medications. Past abdominal surgeries include appendectomy, tubal ligation and abdominal hysterectomy. Patient is retired. Quit smoking cigarettes 4 years ago. Denies alcohol or illicit drug use.   ROS: Review of Systems  Constitutional: Positive for chills and fever.  Respiratory: Positive for cough and shortness of breath.   Cardiovascular: Negative for chest pain and palpitations.  Gastrointestinal: Positive for abdominal pain, diarrhea, nausea and vomiting. Negative for blood in stool and melena.  Genitourinary: Positive for frequency (decreased). Negative for dysuria.       Foul smelling urine  Musculoskeletal: Positive for myalgias.  All other systems reviewed and are negative.   Family History  Problem Relation Age of Onset  . Breast cancer Mother 54  . Ovarian cancer Mother 56  . Brain cancer Mother   . Hypertension Father   . Hyperlipidemia Father   . Heart disease Father   . Breast cancer Maternal Grandmother 21  . Diabetes Paternal Grandmother   . Breast  cancer Paternal Grandmother 85  . Cancer Paternal Grandfather   . Breast cancer Maternal Aunt 60  . Breast cancer Maternal Aunt 60    Past Medical History:  Diagnosis Date  . Allergy   . Anemia   . Anxiety   . Arthritis   . Depression   . GERD (gastroesophageal reflux disease)   . Hypertension   . MVP (mitral valve prolapse)     Past Surgical History:  Procedure Laterality Date  . ABDOMINAL HYSTERECTOMY    . APPENDECTOMY    . BREAST SURGERY Right 1988   tumor removal  . KNEE SURGERY  2005   2 times left  . TUBAL LIGATION      Social History:  reports that she quit smoking about 17 months ago. Her smoking use included cigarettes. She has a 15.00 pack-year smoking history. She has never used smokeless tobacco. She reports previous alcohol use. She reports that she does not use drugs.  Allergies:  Allergies  Allergen Reactions  . Demerol [Meperidine] Hives  . Strawberry (Diagnostic) Hives    Medications Prior to Admission  Medication Sig Dispense Refill  . acetaminophen (TYLENOL) 500 MG tablet Take 1,000 mg by mouth every 6 (six) hours as needed (pain).     Marland Kitchen aspirin EC 325 MG EC tablet Take 1 tablet (325 mg total) by mouth daily. 30 tablet 0  . atorvastatin (LIPITOR) 40 MG tablet Take 40 mg by mouth at bedtime.    Marland Kitchen esomeprazole (NEXIUM) 40 MG capsule TAKE 1 CAPSULE (40 MG TOTAL) BY MOUTH DAILY AT 12 NOON. (Patient taking differently: Take 40 mg by  mouth at bedtime. ) 90 capsule 1  . furosemide (LASIX) 40 MG tablet Take 40 mg by mouth daily.     Marland Kitchen gabapentin (NEURONTIN) 400 MG capsule Take 400 mg by mouth 3 (three) times daily.     . magnesium oxide (MAG-OX) 400 MG tablet Take 1,200 mg by mouth 2 (two) times daily.     . mirtazapine (REMERON) 45 MG tablet Take 45 mg by mouth at bedtime.     . potassium chloride (K-DUR,KLOR-CON) 10 MEQ tablet Take 30 mEq by mouth 2 (two) times daily.     . colestipol (COLESTID) 1 g tablet TAKE 1 TABLET (1 G TOTAL) BY MOUTH 2 (TWO) TIMES  DAILY. (Patient not taking: Reported on 02/16/2019) 180 tablet 1    Blood pressure 129/72, pulse 88, temperature (!) 97.5 F (36.4 C), temperature source Oral, resp. rate 20, height 6\' 1"  (1.854 m), weight 99.3 kg, SpO2 97 %. Physical Exam: Physical Exam Constitutional:      General: She is not in acute distress.    Appearance: She is well-developed. She is obese. She is not toxic-appearing.     Interventions: Nasal cannula in place.  HENT:     Head: Normocephalic and atraumatic.     Right Ear: External ear normal.     Left Ear: External ear normal.     Mouth/Throat:     Lips: Pink.  Eyes:     General: Lids are normal. No scleral icterus.    Extraocular Movements: Extraocular movements intact.     Conjunctiva/sclera: Conjunctivae normal.  Neck:     Musculoskeletal: Normal range of motion and neck supple.     Comments: Voice is hoarse Cardiovascular:     Rate and Rhythm: Normal rate and regular rhythm.     Pulses:          Radial pulses are 2+ on the right side and 2+ on the left side.       Dorsalis pedis pulses are 2+ on the right side and 2+ on the left side.  Pulmonary:     Effort: Pulmonary effort is normal.     Breath sounds: No stridor. Examination of the right-lower field reveals wheezing. Examination of the left-lower field reveals wheezing. Wheezing present.  Abdominal:     General: Bowel sounds are normal. There is no distension.     Palpations: Abdomen is soft. There is no hepatomegaly or splenomegaly.     Tenderness: There is abdominal tenderness in the right upper quadrant. There is no guarding or rebound. Positive signs include Murphy's sign.     Hernia: No hernia is present.  Musculoskeletal:     Comments: ROM grossly intact in bilateral upper and lower extremities  Skin:    General: Skin is dry.     Coloration: Skin is not jaundiced.     Findings: No rash.  Neurological:     Mental Status: She is alert and oriented to person, place, and time.  Psychiatric:         Attention and Perception: Attention and perception normal.        Mood and Affect: Mood and affect normal.        Behavior: Behavior normal. Behavior is cooperative.     Results for orders placed or performed during the hospital encounter of 02/16/19 (from the past 48 hour(s))  Comprehensive metabolic panel     Status: Abnormal   Collection Time: 02/16/19 11:34 AM  Result Value Ref Range   Sodium 139 135 -  145 mmol/L   Potassium 2.2 (LL) 3.5 - 5.1 mmol/L    Comment: CRITICAL RESULT CALLED TO, READ BACK BY AND VERIFIED WITH: M.BRADLEY,RN 1347 02/16/2019 CLARK,S    Chloride 95 (L) 98 - 111 mmol/L   CO2 33 (H) 22 - 32 mmol/L   Glucose, Bld 128 (H) 70 - 99 mg/dL   BUN 10 8 - 23 mg/dL   Creatinine, Ser 0.94 0.44 - 1.00 mg/dL   Calcium 8.5 (L) 8.9 - 10.3 mg/dL   Total Protein 6.4 (L) 6.5 - 8.1 g/dL   Albumin 2.8 (L) 3.5 - 5.0 g/dL   AST 19 15 - 41 U/L   ALT 12 0 - 44 U/L   Alkaline Phosphatase 148 (H) 38 - 126 U/L   Total Bilirubin 0.6 0.3 - 1.2 mg/dL   GFR calc non Af Amer >60 >60 mL/min   GFR calc Af Amer >60 >60 mL/min   Anion gap 11 5 - 15    Comment: Performed at [facility], [place] [address] [place], [place] [address]  CBC with Differential     Status: Abnormal   Collection Time: 02/16/19 11:34 AM  Result Value Ref Range   WBC 22.1 (H) 4.0 - 10.5 K/uL   RBC 4.30 3.87 - 5.11 MIL/uL   Hemoglobin 10.6 (L) 12.0 - 15.0 g/dL   HCT 33.7 (L) 36.0 - 46.0 %   MCV 78.4 (L) 80.0 - 100.0 fL   MCH 24.7 (L) 26.0 - 34.0 pg   MCHC 31.5 30.0 - 36.0 g/dL   RDW 14.7 11.5 - 15.5 %   Platelets 290 150 - 400 K/uL   nRBC 0.0 0.0 - 0.2 %   Neutrophils Relative % 90 %   Neutro Abs 19.9 (H) 1.7 - 7.7 K/uL   Lymphocytes Relative 6 %   Lymphs Abs 1.2 0.7 - 4.0 K/uL   Monocytes Relative 3 %   Monocytes Absolute 0.7 0.1 - 1.0 K/uL   Eosinophils Relative 0 %   Eosinophils Absolute 0.1 0.0 - 0.5 K/uL   Basophils Relative 0 %   Basophils Absolute 0.0 0.0 - 0.1 K/uL   Immature  Granulocytes 1 %   Abs Immature Granulocytes 0.21 (H) 0.00 - 0.07 K/uL    Comment: Performed at [facility] [address], [place], [place] [address]  Influenza panel by PCR (type A & B)     Status: None   Collection Time: 02/16/19 11:35 AM  Result Value Ref Range   Influenza A By PCR NEGATIVE NEGATIVE   Influenza B By PCR NEGATIVE NEGATIVE    Comment: (NOTE) The Xpert Xpress Flu assay is intended as an aid in the diagnosis of  influenza and should not be used as a sole basis for treatment.  This  assay is FDA approved for nasopharyngeal swab specimens only. Nasal  washings and aspirates are unacceptable for Xpert Xpress Flu testing. Performed at [facility], [place] [address], [place], [place] [address]   Urinalysis, Routine w reflex microscopic     Status: Abnormal   Collection Time: 02/16/19  9:13 PM  Result Value Ref Range   Color, Urine YELLOW YELLOW   APPearance HAZY (A) CLEAR   Specific Gravity, Urine 1.024 1.005 - 1.030   pH 5.0 5.0 - 8.0   Glucose, UA NEGATIVE NEGATIVE mg/dL   Hgb urine dipstick MODERATE (A) NEGATIVE   Bilirubin Urine NEGATIVE NEGATIVE   Ketones, ur NEGATIVE NEGATIVE mg/dL   Protein, ur 100 (  A) NEGATIVE mg/dL   Nitrite NEGATIVE NEGATIVE   Leukocytes,Ua MODERATE (A) NEGATIVE   RBC / HPF 11-20 0 - 5 RBC/hpf   WBC, UA 21-50 0 - 5 WBC/hpf   Bacteria, UA MANY (A) NONE SEEN   Squamous Epithelial / LPF 0-5 0 - 5   Mucus PRESENT     Comment: Performed at [facility], [place] [address], [place], [place] [address]  HIV antibody (Routine Testing)     Status: None   Collection Time: 02/17/19  2:10 AM  Result Value Ref Range   HIV Screen 4th Generation wRfx Non Reactive Non Reactive    Comment: (NOTE) Performed At: [facility] [place], [place] [id number] Rush Farmer MD VO:[id number]   Basic metabolic panel     Status: Abnormal   Collection Time: 02/17/19  2:10 AM  Result Value Ref Range   Sodium 140 135 - 145  mmol/L   Potassium 2.6 (LL) 3.5 - 5.1 mmol/L    Comment: CRITICAL RESULT CALLED TO, READ BACK BY AND VERIFIED WITH: L.DOBSON,RN 0303 02/17/2019 M.CAMPBELL    Chloride 97 (L) 98 - 111 mmol/L   CO2 31 22 - 32 mmol/L   Glucose, Bld 174 (H) 70 - 99 mg/dL   BUN 8 8 - 23 mg/dL   Creatinine, Ser 1.01 (H) 0.44 - 1.00 mg/dL   Calcium 8.1 (L) 8.9 - 10.3 mg/dL   GFR calc non Af Amer 58 (L) >60 mL/min   GFR calc Af Amer >60 >60 mL/min   Anion gap 12 5 - 15    Comment: Performed at [facility] [address] [place], [place] [address]  Magnesium     Status: Abnormal   Collection Time: 02/17/19  2:10 AM  Result Value Ref Range   Magnesium 1.6 (L) 1.7 - 2.4 mg/dL    Comment: Performed at [facility] [address] [place], [place] [address]  Basic metabolic panel     Status: Abnormal   Collection Time: 02/18/19  3:33 AM  Result Value Ref Range   Sodium 138 135 - 145 mmol/L   Potassium 2.6 (LL) 3.5 - 5.1 mmol/L    Comment: CRITICAL RESULT CALLED TO, READ BACK BY AND VERIFIED WITH: DOBSON T,RN 02/18/19 0433 WAYK    Chloride 97 (L) 98 - 111 mmol/L   CO2 29 22 - 32 mmol/L   Glucose, Bld 141 (H) 70 - 99 mg/dL   BUN 11 8 - 23 mg/dL   Creatinine, Ser 1.18 (H) 0.44 - 1.00 mg/dL   Calcium 8.4 (L) 8.9 - 10.3 mg/dL   GFR calc non Af Amer 48 (L) >60 mL/min   GFR calc Af Amer 56 (L) >60 mL/min   Anion gap 12 5 - 15    Comment: Performed at [facility] [address] [place], [place] [address]  Magnesium     Status: None   Collection Time: 02/18/19  3:33 AM  Result Value Ref Range   Magnesium 2.0 1.7 - 2.4 mg/dL    Comment: Performed at [facility] [address], [place], [place] [address]   Dg Chest 2 View  Result Date: 02/16/2019 CLINICAL DATA:  Productive cough, fever, shortness of Breath EXAM: CHEST - 2 VIEW COMPARISON:  12/20/2017 FINDINGS: Lingular scarring. Heart is upper limits normal in size. No acute confluent airspace opacities or effusions. No acute  bony abnormality. IMPRESSION: No active cardiopulmonary disease. Electronically Signed   By:  Rolm Baptise M.D.   On: 02/16/2019 12:25   US Abdomen Limited Ruq  Result Date: 02/16/2019 CLINICAL DATA:  Nausea and vomiting. Right upper quadrant pain for 5 days. EXAM: ULTRASOUND ABDOMEN LIMITED RIGHT UPPER QUADRANT COMPARISON:  None. FINDINGS: Gallbladder: A single mobile shadowing stone measures up to 1.0 cm. The patient does exhibit a sonographic Murphy's sign with tenderness over the gallbladder. There is no gallbladder wall thickening. Common bile duct: Diameter: 4.7 mm, within normal limits Liver: No focal lesion identified. Within normal limits in parenchymal echogenicity. Portal vein is patent on color Doppler imaging with normal direction of blood flow towards the liver. IMPRESSION: 1. Cholelithiasis.  A single shadowing stone measures up to 10 mm. 2. Positive sonographic Murphy sign. In the setting of cholelithiasis, this raises concern for early cholecystitis despite absence of gallbladder wall thickening. Electronically Signed   By: San Morelle M.D.   On: 02/16/2019 15:56      Assessment/Plan COPD with exacerbation - flu negative, could consider respiratory panel but will defer to medicine on this Chronic diastolic CHf with mild mitral regurgitation  HTN GERD Possible UTI - UA suggestive of possible UTI, recommend sending urine culture Hypokalemia - K 2.6, replacement per primary team  Leukocytosis - WBC 22 on admission, 19 this AM Diarrhea - GI panel, not typical of cholecystitis RUQ pain with nausea and vomiting Cholelithiasis - RUQ US shows mobile stone without any gallbladder wall thickening, sonographic Murphy sign present - LFTs normal on admission, ALP mildly elevated at 127, Tbili normal - lipase 29 - recommend HIDA scan in setting of unclear etiology of abdominal symptoms with elevated WBC - if patient ends up requiring surgery will need medical clearance and  optimization prior to any surgical intervention  FEN: NPO - ok to resume liquids as tolerated after HIDA VTE: SCDs, lovenox ID: no current abx  Brigid Re, [facility] Surgery 02/18/2019, 9:13 AM Pager: [phone] Consults: [phone]

## 2019-02-18 NOTE — Progress Notes (Signed)
CRITICAL VALUE ALERT  Critical Value: Potassium 2.6  Date & Time Notied: 02/18/2019   0443  Provider Notified: Tylene Fantasia, NP  Orders Received/Actions taken: See new orders

## 2019-02-18 NOTE — Progress Notes (Signed)
Dr. Oretha Milch sent text page to notify of K2.6.

## 2019-02-18 NOTE — Progress Notes (Signed)
[facility] PROGRESS NOTE  Morgan [name]:[id number] DOB: 1951-01-25 DOA: 02/16/2019 PCP: [name], MD  Assessment/Plan: 1. Right upper quadrant abdominal pain and diarrhea.  Right upper quadrant with positive Murphy sign but no overt cholecystitis.  HIDA scan recommended by general surgery was unremarkable.  Lipase and LFTs also unremarkable.  Flu panel negative follow GI pathogen panel.  Closely monitor electrolytes and volume status.  Resume clear liquid diet, IV antiemetics.  If persists may obtain CT abdomen for further evaluation 2. Leukocytosis, improving.  22 on admission, slowly downtrending to 19.  Remains afebrile.  No localizing signs or symptoms of infection.  Chest x-ray unremarkable, do not suspect cholecystitis given negative HIDA. 3. Hypokalemia, secondary to GI loss.  Discontinue Lasix regimen.  Replete orally, monitor BMP.  Mag within normal limits. 4. COPD exacerbation, improving chest x-ray shows no opacity or vascular congestion, only on 1 L O2 will wean oxygen for goal SPO2 greater than 88%, transition from IV Solu-Medrol to oral prednisone, continue scheduled duo nebs and cough regimen 5. Chronic CHF with preserved EF, stable.  (EF 55-65%, 08/2018).  Discontinue home Lasix given persistent hypokalemia from GI losses.  Euvolemic currently, continue to monitor. 6. GERD, stable continue Protonix 7. Hyperlipidemia, stable.  Continue pravastatin 8. Depression, stable.  Continue Remeron 9. CKD, stage III, stable.  Baseline 0.9-1.2.  Avoid nephrotoxins, closely monitor BMP 10. Chronic microcytic anemia, stable.  Baseline hemoglobin 10-12.  Closely monitor no active signs of bleeding   Code Status: Full code Family Communication: No family at bedside  Disposition Plan: Wean O2, transition from IV steroids to oral prednisone, GI pathogen panel, improvement in persistent abdominal pain (barely tolerating oral diet)   Consultants:  General  surgery  Procedures:  HIDA scan 3/4  Antibiotics:  None   HPI/Subjective:  Morgan [name] is a 68 y.o. year old female with medical history significant for COPD, HTN, MVP, chronic anemia who presented on 02/16/2019 with days of cough, emesis and diarrhea with subjective fever and weakness and was found to have leukocytosis, COPD exacerbation and abdominal pain with diarrhea.  This a.m. reports some improvement in breathing but persistent belly pain.  Decreased appetite given nausea and persistent abdominal pain  Objective: Vitals:   02/18/19 0817 02/18/19 1500  BP: 129/72 130/74  Pulse: 88 87  Resp:    Temp: (!) 97.5 F (36.4 C) 98 F (36.7 C)  SpO2: 97% 97%    Intake/Output Summary (Last 24 hours) at 02/18/2019 1740 Last data filed at 02/18/2019 0900 Gross per 24 hour  Intake 891 ml  Output -  Net 891 ml   Filed Weights   02/16/19 1019  Weight: 99.3 kg    Exam:  Constitutional:normal appearing female, in pain but no acute distress Eyes: EOMI, anicteric, normal conjunctivae ENMT: Oropharynx with moist mucous membranes,  Neck: FROM Cardiovascular: RRR no MRGs, with no peripheral edema Respiratory: Normal respiratory effort 1 L, diffuse crackles at bases and rhonchi  Abdomen: Soft,generalized tenderness, especially in RUQ, pos murphy's sign, hyperactive bowel sounds, no rebound tenderness, with no HSM Skin: No rash ulcers, or lesions. Without skin tenting  Neurologic: Grossly no focal neuro deficit. Psychiatric:Appropriate affect, and mood. Mental status AAOx3  Data Reviewed: Basic Metabolic Panel: Recent Labs  Lab 02/16/19 1134 02/17/19 0210 02/18/19 0333  NA 139 140 138  K 2.2* 2.6* 2.6*  CL 95* 97* 97*  CO2 33* 31 29  GLUCOSE 128* 174* 141*  BUN 10 8 11   CREATININE  0.94 1.01* 1.18*  CALCIUM 8.5* 8.1* 8.4*  MG  --  1.6* 2.0   Liver Function Tests: Recent Labs  Lab 02/16/19 1134 02/18/19 0846  AST 19 41  ALT 12 19  ALKPHOS 148* 127*  BILITOT 0.6  0.7  PROT 6.4* 6.3*  ALBUMIN 2.8* 2.4*   Recent Labs  Lab 02/18/19 0846  LIPASE 29   No results for input(s): AMMONIA in the last 168 hours. CBC: Recent Labs  Lab 02/16/19 1134 02/18/19 0846  WBC 22.1* 19.3*  NEUTROABS 19.9*  --   HGB 10.6* 10.8*  HCT 33.7* 33.2*  MCV 78.4* 77.6*  PLT 290 213   Cardiac Enzymes: No results for input(s): CKTOTAL, CKMB, CKMBINDEX, TROPONINI in the last 168 hours. BNP (last 3 results) No results for input(s): BNP in the last 8760 hours.  ProBNP (last 3 results) No results for input(s): PROBNP in the last 8760 hours.  CBG: No results for input(s): GLUCAP in the last 168 hours.  No results found for this or any previous visit (from the past 240 hour(s)).   Studies: Nm Hepatobiliary Liver Func  Result Date: 02/18/2019 CLINICAL DATA:  Nausea, vomiting, right upper quadrant pain for 5 days EXAM: NUCLEAR MEDICINE HEPATOBILIARY IMAGING TECHNIQUE: Sequential images of the abdomen were obtained out to 60 minutes following intravenous administration of radiopharmaceutical. RADIOPHARMACEUTICALS:  5.3 mCi Tc-31m  Choletec IV COMPARISON:  None. FINDINGS: Prompt uptake and biliary excretion of activity by the liver is seen. Gallbladder activity is visualized, consistent with patency of cystic duct. Biliary activity passes into small bowel, consistent with patent common bile duct. IMPRESSION: Normal hepatobiliary scan. No scintigraphic evidence of acute cholecystitis or biliary obstruction. Electronically Signed   By: Kathreen Devoid   On: 02/18/2019 15:44   Dg Chest Port 1 View  Result Date: 02/18/2019 CLINICAL DATA:  Shortness of breath and cough EXAM: PORTABLE CHEST 1 VIEW COMPARISON:  02/16/2019 FINDINGS: Cardiac shadows within normal limits. The lungs are well aerated bilaterally. Mild left basilar atelectasis is noted. No sizable effusion is seen. No bony abnormality is noted. IMPRESSION: Mild left basilar atelectasis. Electronically Signed   By: Inez Catalina  M.D.   On: 02/18/2019 10:59    Scheduled Meds: . aspirin  325 mg Oral Daily  . atorvastatin  40 mg Oral QHS  . enoxaparin (LOVENOX) injection  40 mg Subcutaneous Q24H  . gabapentin  400 mg Oral TID  . ipratropium-albuterol  3 mL Nebulization BID  . methylPREDNISolone (SOLU-MEDROL) injection  40 mg Intravenous Q6H  . mirtazapine  45 mg Oral QHS  . pantoprazole  40 mg Oral Daily  . potassium chloride  40 mEq Oral Once   Continuous Infusions:  Active Problems:   Hypokalemia   Essential hypertension   Depression   CHF (congestive heart failure) (HCC)   COPD exacerbation ([place])      Desiree Hane  [facility]

## 2019-02-19 DIAGNOSIS — R0602 Shortness of breath: Secondary | ICD-10-CM

## 2019-02-19 DIAGNOSIS — A084 Viral intestinal infection, unspecified: Secondary | ICD-10-CM | POA: Diagnosis present

## 2019-02-19 DIAGNOSIS — G8929 Other chronic pain: Secondary | ICD-10-CM | POA: Diagnosis present

## 2019-02-19 DIAGNOSIS — R8271 Bacteriuria: Secondary | ICD-10-CM | POA: Clinically undetermined

## 2019-02-19 DIAGNOSIS — R109 Unspecified abdominal pain: Secondary | ICD-10-CM | POA: Diagnosis present

## 2019-02-19 DIAGNOSIS — I10 Essential (primary) hypertension: Secondary | ICD-10-CM

## 2019-02-19 LAB — BASIC METABOLIC PANEL
Anion gap: 12 (ref 5–15)
BUN: 17 mg/dL (ref 8–23)
CO2: 27 mmol/L (ref 22–32)
Calcium: 8.2 mg/dL — ABNORMAL LOW (ref 8.9–10.3)
Chloride: 96 mmol/L — ABNORMAL LOW (ref 98–111)
Creatinine, Ser: 1.15 mg/dL — ABNORMAL HIGH (ref 0.44–1.00)
GFR calc Af Amer: 58 mL/min — ABNORMAL LOW (ref >60)
GFR calc non Af Amer: 50 mL/min — ABNORMAL LOW (ref >60)
Glucose, Bld: 164 mg/dL — ABNORMAL HIGH (ref 70–99)
Potassium: 3.7 mmol/L (ref 3.5–5.1)
SODIUM: 135 mmol/L (ref 135–145)

## 2019-02-19 LAB — CBC
HCT: 35.5 % — ABNORMAL LOW (ref 36.0–46.0)
Hemoglobin: 10.7 g/dL — ABNORMAL LOW (ref 12.0–15.0)
MCH: 24.1 pg — AB (ref 26.0–34.0)
MCHC: 30.1 g/dL (ref 30.0–36.0)
MCV: 80 fL (ref 80.0–100.0)
Platelets: 330 10*3/uL (ref 150–400)
RBC: 4.44 MIL/uL (ref 3.87–5.11)
RDW: 14.9 % (ref 11.5–15.5)
WBC: 18.1 10*3/uL — ABNORMAL HIGH (ref 4.0–10.5)
nRBC: 0 % (ref 0.0–0.2)

## 2019-02-19 MED ORDER — PREDNISONE 20 MG PO TABS: 40.0000 mg | ORAL_TABLET | Freq: Every day | ORAL | 0 refills | Status: AC

## 2019-02-19 MED ORDER — FUROSEMIDE 40 MG PO TABS: ORAL_TABLET | ORAL | Status: DC

## 2019-02-19 MED ORDER — IPRATROPIUM-ALBUTEROL 0.5-2.5 (3) MG/3ML IN SOLN: 3.0000 mL | RESPIRATORY_TRACT | Status: DC | PRN

## 2019-02-19 MED ORDER — MAGNESIUM OXIDE 400 MG PO TABS: ORAL_TABLET | ORAL | 11 refills | Status: DC

## 2019-02-19 NOTE — Discharge Summary (Addendum)
Discharge Summary  Morgan [name] [id number] DOB: 02-Aug-1951  PCP: [name], MD  Admit date: 02/16/2019 Discharge date: 02/19/2019   Time spent: < 25 minutes  Admitted From: Home Disposition: Home  Recommendations for Outpatient Follow-up:  1. Follow up with PCP in 1 to 2 weeks (check CBC for leukocytosis, reassess restarting lasix-held during acute diarrhea) 2. Prednisone 40 mg x 2 days 3. Surgery recommends outpatient elective cholecystectomy if she ever develops symptoms related to her gallbladder   4. Was unable to keep Spo2 > 88% on room air on ambulation so on discharge was provided with 3L [place] on discharge   Discharge Diagnoses:  Active Hospital Problems   Diagnosis Date Noted  . COPD exacerbation ([place]) 02/16/2019  . CHF (congestive heart failure) ([place]) 01/13/2019  . Essential hypertension   . Depression   . Hypokalemia 02/22/2015    Resolved Hospital Problems  No resolved problems to display.    Discharge Condition: STABLE   CODE STATUS:FULL    History of present illness:  Morgan [name] is a 68 y.o. year old female with medical history significant for COPD, HTN, MVP, chronic anemia who presented on 02/16/2019 with days of cough, emesis and diarrhea with subjective fever and weakness and was found to have leukocytosis, COPD exacerbation and abdominal pain with diarrhea presumed related to viral gastroenteritis. Remaining hospital course addressed in problem based format below:   Hospital Course:   1. Right upper quadrant abdominal pain and diarrhea, suspect viral gastroenteritis.    Right upper quadrant ultrasound was negative for overt signs of cholecystitis but concerning for positive Murphy sign sonographically.   HIDA scan recommended by general surgery was unremarkable.  Lipase and LFTs were also unremarkable.  Flu panel negative.  With supportive care diarrhea resolved and patient was able to tolerate regular diet with only minimal abdominal pain.  Suspect  most likely viral gastroenteritis given resolution with no acute intervention.  Advised patient to remain well-hydrated on discharge and follow-up with PCP within 1 week.  Surgery recommends elective outpatient cholecystectomy if she ever develops symptoms related to gallbladder.  2. Leukocytosis, improving.    On admission WBC was as high as 22, slowly downtrending to 18.  Had no localizing signs or symptoms of infection.  Likely remain elevated due to IV steroids.  Anticipate this will stay high while on prednisone burst for COPD flare.  3. Hypokalemia, secondary to GI loss.  Responded well to oral repletion Discontinue Lasix regimen during hospital stay can resume as outpatient once acute GI illness resolved.    Can resume home potassium regimen  4. Acute hypoxic respiratory failure secondary to COPD exacerbation, improving.  Suspect acute flare related to GI illness above admitting chest x-ray shows no opacity or vascular congestion, she needed supplemental oxygen and was unable to keep Spo2 > 88% on room air on ambulation so on discharge was provided with 3L [place].  Responded well to scheduled duo nebs, IV Solu-Medrol.  We will continue prednisone burst on discharge and resume previous inhaler regimen   5. Chronic CHF with preserved EF, stable.  (EF 55-65%, 08/2018).  Remained euvolemic on exam with no signs of vascular congestion on chest x-ray.  Her Lasix was held intermittently during hospital stay given GI volume loss.  Advised to continue holding until follow-up with PCP in setting of acute GI illness/diarrhea   6. GERD, stable continue Protonix  7. Hyperlipidemia, stable.  Continue pravastatin  8. Depression, stable.  Continue Remeron  9.  CKD, stage III, stable.  Baseline 0.9-1.2.    10. Chronic microcytic anemia, stable.  Baseline hemoglobin 10-12.   11. Asymptomatic bacteriuria.  UA urine culture sent initial work-up of abdominal pain.  Patient without urinary symptoms.  Urine culture  grew E. coli however given asymptomatic no need to initiate antibiotics   Consultations:  Surgery  Procedures/Studies: HIDA scan, 02/18/2019  FINDINGS: Prompt uptake and biliary excretion of activity by the liver is seen. Gallbladder activity is visualized, consistent with patency of cystic duct. Biliary activity passes into small bowel, consistent with patent common bile duct.  IMPRESSION: Normal hepatobiliary scan. No scintigraphic evidence of acute cholecystitis or biliary obstruction.  Discharge Exam: BP 125/84 (BP Location: Left Arm)   Pulse 88   Temp 97.7 F (36.5 C) (Oral)   Resp 20   Ht 6\' 1"  (1.854 m)   Wt 99.3 kg   SpO2 97%   BMI 28.89 kg/m   General: Lying in bed, no apparent distress Eyes: EOMI, anicteric ENT: Oral Mucosa clear and moist Cardiovascular: regular rate and rhythm, no murmurs, rubs or gallops, no edema, Respiratory: Normal respiratory effort on 3 L, lungs clear to auscultation bilaterally Abdomen: soft, non-distended, non-tender, normal bowel sounds Skin: No Rash Neurologic: Grossly no focal neuro deficit.Mental status AAOx3, speech normal, Psychiatric:Appropriate affect, and mood   Discharge Instructions You were cared for by a hospitalist during your hospital stay. If you have any questions about your discharge medications or the care you received while you were in the hospital after you are discharged, you can call the unit and asked to speak with the hospitalist on call if the hospitalist that took care of you is not available. Once you are discharged, your primary care physician will handle any further medical issues. Please note that NO REFILLS for any discharge medications will be authorized once you are discharged, as it is imperative that you return to your primary care physician (or establish a relationship with a primary care physician if you do not have one) for your aftercare needs so that they can reassess your need for medications and  monitor your lab values.  Discharge Instructions    Diet - low sodium heart healthy   Complete by:  As directed    Increase activity slowly   Complete by:  As directed      Allergies as of 02/19/2019      Reactions   Demerol [meperidine] Hives   Strawberry (diagnostic) Hives      Medication List    STOP taking these medications   colestipol 1 g tablet Commonly known as:  COLESTID     TAKE these medications   acetaminophen 500 MG tablet Commonly known as:  TYLENOL Take 1,000 mg by mouth every 6 (six) hours as needed (pain).   aspirin 325 MG EC tablet Take 1 tablet (325 mg total) by mouth daily.   atorvastatin 40 MG tablet Commonly known as:  LIPITOR Take 40 mg by mouth at bedtime.   esomeprazole 40 MG capsule Commonly known as:  NEXIUM TAKE 1 CAPSULE (40 MG TOTAL) BY MOUTH DAILY AT 12 NOON. What changed:  when to take this   furosemide 40 MG tablet Commonly known as:  LASIX HOLD UNTIL seen by your doctor What changed:    how much to take  how to take this  when to take this  additional instructions   gabapentin 400 MG capsule Commonly known as:  NEURONTIN Take 400 mg by mouth 3 (three) times  daily.   magnesium oxide 400 MG tablet Commonly known as:  MAG-OX HOLD UNTIL seen by your regular doctor What changed:    how much to take  how to take this  when to take this  additional instructions   mirtazapine 45 MG tablet Commonly known as:  REMERON Take 45 mg by mouth at bedtime.   potassium chloride 10 MEQ tablet Commonly known as:  K-DUR,KLOR-CON Take 30 mEq by mouth 2 (two) times daily.   predniSONE 20 MG tablet Commonly known as:  DELTASONE Take 2 tablets (40 mg total) by mouth daily for 2 days. Start taking on:  February 20, 2019      Allergies  Allergen Reactions  . Demerol [Meperidine] Hives  . Strawberry (Diagnostic) Hives      The results of significant diagnostics from this hospitalization (including imaging, microbiology,  ancillary and laboratory) are listed below for reference.    Significant Diagnostic Studies: Dg Chest 2 View  Result Date: 02/16/2019 CLINICAL DATA:  Productive cough, fever, shortness of Breath EXAM: CHEST - 2 VIEW COMPARISON:  12/20/2017 FINDINGS: Lingular scarring. Heart is upper limits normal in size. No acute confluent airspace opacities or effusions. No acute bony abnormality. IMPRESSION: No active cardiopulmonary disease. Electronically Signed   By: Rolm Baptise M.D.   On: 02/16/2019 12:25   Nm Hepatobiliary Liver Func  Result Date: 02/18/2019 CLINICAL DATA:  Nausea, vomiting, right upper quadrant pain for 5 days EXAM: NUCLEAR MEDICINE HEPATOBILIARY IMAGING TECHNIQUE: Sequential images of the abdomen were obtained out to 60 minutes following intravenous administration of radiopharmaceutical. RADIOPHARMACEUTICALS:  5.3 mCi Tc-66m  Choletec IV COMPARISON:  None. FINDINGS: Prompt uptake and biliary excretion of activity by the liver is seen. Gallbladder activity is visualized, consistent with patency of cystic duct. Biliary activity passes into small bowel, consistent with patent common bile duct. IMPRESSION: Normal hepatobiliary scan. No scintigraphic evidence of acute cholecystitis or biliary obstruction. Electronically Signed   By: Kathreen Devoid   On: 02/18/2019 15:44   Dg Chest Port 1 View  Result Date: 02/18/2019 CLINICAL DATA:  Shortness of breath and cough EXAM: PORTABLE CHEST 1 VIEW COMPARISON:  02/16/2019 FINDINGS: Cardiac shadows within normal limits. The lungs are well aerated bilaterally. Mild left basilar atelectasis is noted. No sizable effusion is seen. No bony abnormality is noted. IMPRESSION: Mild left basilar atelectasis. Electronically Signed   By: Inez Catalina M.D.   On: 02/18/2019 10:59   US Abdomen Limited Ruq  Result Date: 02/16/2019 CLINICAL DATA:  Nausea and vomiting. Right upper quadrant pain for 5 days. EXAM: ULTRASOUND ABDOMEN LIMITED RIGHT UPPER QUADRANT COMPARISON:   None. FINDINGS: Gallbladder: A single mobile shadowing stone measures up to 1.0 cm. The patient does exhibit a sonographic Murphy's sign with tenderness over the gallbladder. There is no gallbladder wall thickening. Common bile duct: Diameter: 4.7 mm, within normal limits Liver: No focal lesion identified. Within normal limits in parenchymal echogenicity. Portal vein is patent on color Doppler imaging with normal direction of blood flow towards the liver. IMPRESSION: 1. Cholelithiasis.  A single shadowing stone measures up to 10 mm. 2. Positive sonographic Murphy sign. In the setting of cholelithiasis, this raises concern for early cholecystitis despite absence of gallbladder wall thickening. Electronically Signed   By: San Morelle M.D.   On: 02/16/2019 15:56    Microbiology: Recent Results (from the past 240 hour(s))  Culture, Urine     Status: Abnormal (Preliminary result)   Collection Time: 02/18/19 12:35 PM  Result Value Ref  Range Status   Specimen Description URINE, CLEAN CATCH  Final   Special Requests   Final    NONE Performed at [facility], [place] [address], [place], [place] [address]    Culture >=100,000 COLONIES/mL GRAM NEGATIVE RODS (A)  Final   Report Status PENDING  Incomplete     Labs: Basic Metabolic Panel: Recent Labs  Lab 02/16/19 1134 02/17/19 0210 02/18/19 0333 02/19/19 0314  NA 139 140 138 135  K 2.2* 2.6* 2.6* 3.7  CL 95* 97* 97* 96*  CO2 33* 31 29 27   GLUCOSE 128* 174* 141* 164*  BUN 10 8 11 17   CREATININE 0.94 1.01* 1.18* 1.15*  CALCIUM 8.5* 8.1* 8.4* 8.2*  MG  --  1.6* 2.0  --    Liver Function Tests: Recent Labs  Lab 02/16/19 1134 02/18/19 0846  AST 19 41  ALT 12 19  ALKPHOS 148* 127*  BILITOT 0.6 0.7  PROT 6.4* 6.3*  ALBUMIN 2.8* 2.4*   Recent Labs  Lab 02/18/19 0846  LIPASE 29   No results for input(s): AMMONIA in the last 168 hours. CBC: Recent Labs  Lab 02/16/19 1134 02/18/19 0846 02/19/19 0314  WBC 22.1* 19.3*  18.1*  NEUTROABS 19.9*  --   --   HGB 10.6* 10.8* 10.7*  HCT 33.7* 33.2* 35.5*  MCV 78.4* 77.6* 80.0  PLT 290 213 330   Cardiac Enzymes: No results for input(s): CKTOTAL, CKMB, CKMBINDEX, TROPONINI in the last 168 hours. BNP: BNP (last 3 results) No results for input(s): BNP in the last 8760 hours.  ProBNP (last 3 results) No results for input(s): PROBNP in the last 8760 hours.  CBG: No results for input(s): GLUCAP in the last 168 hours.     Signed:  Desiree Hane, MD [facility] 02/19/2019, 3:15 PM

## 2019-02-19 NOTE — Final Consult Note (Signed)
Consultant Final Sign-Off Note    Assessment/Final recommendations  Morgan [name] is a 68 y.o. female followed by me for:  RUQ pain with nausea and vomiting Cholelithiasis Diarrhea  - RUQ US shows mobile stone without any gallbladder wall thickening, sonographic Murphy sign present - LFTs normal on admission, ALP mildly elevated at 127, Tbili normal - HIDA scan was normal, no evidence of acute cholecystitis or biliary obstruction - no indication for surgical intervention   Wound care (if applicable):    Diet at discharge: per primary team   Activity at discharge: per primary team   Follow-up appointment:  None needed   Pending results:  Unresulted Labs (From admission, onward)    Start     Ordered   02/18/19 0940  Gastrointestinal Panel by PCR , Stool  (Gastrointestinal Panel by PCR, Stool)  Once,   R     02/18/19 0940   02/18/19 0940  Culture, Urine  Once,   R     02/18/19 0940           Medication recommendations:   Other recommendations:     Thank you for allowing [place] to participate in the care of your patient!  Please consult [place] again if you have further needs for your patient.  Kalman Drape 02/19/2019 8:05 AM    Subjective   CC: diarrhea and right sided abdominal pain  Pt states her diarrhea this am was more formed and without blood. She is still having right sided abdominal pain. No vomiting. She is eating eggs for breakfast.   Objective  Vital signs in last 24 hours: Temp:  [97.5 F (36.4 C)-98 F (36.7 C)] 97.7 F (36.5 C) (03/05 0000) Pulse Rate:  [87-88] 87 (03/05 0000) BP: (112-130)/(72-74) 112/73 (03/05 0000) SpO2:  [92 %-97 %] 92 % (03/05 0000)  PE: General: well appearing, NAD, eating breakfast Lungs: rate and effort normal GI: soft, ND, TTP of right hemiabdomen, no guarding, no peritonitis    Pertinent labs and Studies: Recent Labs    02/16/19 1134 02/18/19 0846 02/19/19 0314  WBC 22.1* 19.3* 18.1*  HGB 10.6* 10.8* 10.7*   HCT 33.7* 33.2* 35.5*   BMET Recent Labs    02/18/19 0333 02/19/19 0314  NA 138 135  K 2.6* 3.7  CL 97* 96*  CO2 29 27  GLUCOSE 141* 164*  BUN 11 17  CREATININE 1.18* 1.15*  CALCIUM 8.4* 8.2*   No results for input(s): LABURIN in the last 72 hours. Results for orders placed or performed during the hospital encounter of 08/26/17  C difficile quick scan w PCR reflex     Status: None   Collection Time: 08/26/17  8:37 PM  Result Value Ref Range Status   C Diff antigen NEGATIVE NEGATIVE Final   C Diff toxin NEGATIVE NEGATIVE Final   C Diff interpretation No C. difficile detected.  Final  Culture, blood (Routine X 2) w Reflex to ID Panel     Status: None   Collection Time: 08/26/17  9:48 PM  Result Value Ref Range Status   Specimen Description BLOOD LEFT HAND  Final   Special Requests   Final    BOTTLES DRAWN AEROBIC AND ANAEROBIC Blood Culture adequate volume   Culture NO GROWTH 5 DAYS  Final   Report Status 08/31/2017 FINAL  Final  Culture, blood (Routine X 2) w Reflex to ID Panel     Status: None   Collection Time: 08/26/17  9:48 PM  Result Value Ref  Range Status   Specimen Description BLOOD RAC  Final   Special Requests   Final    BOTTLES DRAWN AEROBIC AND ANAEROBIC Blood Culture results may not be optimal due to an excessive volume of blood received in culture bottles   Culture NO GROWTH 5 DAYS  Final   Report Status 08/31/2017 FINAL  Final  MRSA PCR Screening     Status: None   Collection Time: 08/28/17 11:12 AM  Result Value Ref Range Status   MRSA by PCR NEGATIVE NEGATIVE Final    Comment:        The GeneXpert MRSA Assay (FDA approved for NASAL specimens only), is one component of a comprehensive MRSA colonization surveillance program. It is not intended to diagnose MRSA infection nor to guide or monitor treatment for MRSA infections.   Culture, respiratory (NON-Expectorated)     Status: Abnormal   Collection Time: 08/29/17 10:46 PM  Result Value Ref Range  Status   Specimen Description SPUTUM  Final   Special Requests NONE  Final   Gram Stain   Final    RARE WBC PRESENT,BOTH PMN AND MONONUCLEAR NO SQUAMOUS EPITHELIAL CELLS SEEN NO ORGANISMS SEEN Performed at [facility], [address] [place], [place] [address]    Culture MULTIPLE ORGANISMS PRESENT, NONE PREDOMINANT (A)  Final   Report Status 09/01/2017 FINAL  Final    Imaging: Nm Hepatobiliary Liver Func  Result Date: 02/18/2019 CLINICAL DATA:  Nausea, vomiting, right upper quadrant pain for 5 days EXAM: NUCLEAR MEDICINE HEPATOBILIARY IMAGING TECHNIQUE: Sequential images of the abdomen were obtained out to 60 minutes following intravenous administration of radiopharmaceutical. RADIOPHARMACEUTICALS:  5.3 mCi Tc-54m  Choletec IV COMPARISON:  None. FINDINGS: Prompt uptake and biliary excretion of activity by the liver is seen. Gallbladder activity is visualized, consistent with patency of cystic duct. Biliary activity passes into small bowel, consistent with patent common bile duct. IMPRESSION: Normal hepatobiliary scan. No scintigraphic evidence of acute cholecystitis or biliary obstruction. Electronically Signed   By: Kathreen Devoid   On: 02/18/2019 15:44   Dg Chest Port 1 View  Result Date: 02/18/2019 CLINICAL DATA:  Shortness of breath and cough EXAM: PORTABLE CHEST 1 VIEW COMPARISON:  02/16/2019 FINDINGS: Cardiac shadows within normal limits. The lungs are well aerated bilaterally. Mild left basilar atelectasis is noted. No sizable effusion is seen. No bony abnormality is noted. IMPRESSION: Mild left basilar atelectasis. Electronically Signed   By: Inez Catalina M.D.   On: 02/18/2019 10:59

## 2019-02-19 NOTE — Progress Notes (Addendum)
SATURATION QUALIFICATIONS: (This note is used to comply with regulatory documentation for home oxygen)  Patient Saturations on Room Air at Rest = 92%  Patient Saturations on Room Air while Ambulating = 88%  Patient Saturations on 3 L/min [place] while ambulating = 92%  Please briefly explain why patient needs home oxygen: COPD

## 2019-02-19 NOTE — Care Management Note (Signed)
Case Management Note  Patient Details  Name: Morgan [name] MRN: [id number] Date of Birth: 08-14-1951  Subjective/Objective:  From home, copd ex, will need home oxygen at discharge, Jermaine with [employer] will bring oxygen up to patient's room prior to dc  and facilitate getting it  set up to be delivered to her home.                   Action/Plan: DC home when oxygen received in patient room.   Expected Discharge Date:  02/19/19               Expected Discharge Plan:  Home/Self Care  In-House Referral:     Discharge planning Services  CM Consult  Post Acute Care Choice:  Durable Medical Equipment Choice offered to:  Patient  DME Arranged:  Oxygen DME Agency:  [employer]  HH Arranged:    [place] Agency:     Status of Service:  Completed, signed off  If discussed at [place] of Stay Meetings, dates discussed:    Additional Comments:  Zenon Mayo, RN 02/19/2019, 4:29 PM

## 2019-02-20 ENCOUNTER — Telehealth: Payer: Self-pay

## 2019-02-20 LAB — URINE CULTURE: Culture: >=100000 — AB

## 2019-02-20 NOTE — Telephone Encounter (Signed)
Transition Care Management Follow-up Telephone Call   Date discharged? 02/19/2019   How have you been since you were released from the hospital? Spoke with patient and she states she is really weak still, she was put on 3 liters of oxygen. She is able to keep liquids down for now and is about to try and eat some crackers. She is trying to keep to blend diet. No vomiting or diarrhea so far at this time.   Do you understand why you were in the hospital? yes   Do you understand the discharge instructions? yes   Where were you discharged to? home    Items Reviewed:   Medications reviewed: yes  Allergies reviewed: yes  Dietary changes reviewed: yes  Referrals reviewed: yes-to follow up with PCP   Functional Questionnaire:   Activities of Daily Living (ADLs):   She states they are independent in the following: feeding, bathing, dressing, hygiene, grooming, toileting. States they require assistance with the following: ambulation-uses a cane   Any transportation issues/concerns?: no   Any patient concerns? Patient is worried because she was told to stop taking Lasix due to her potassium level was low she was told. She wanted to ask Dr. Einar Pheasant is this ok to be off of this medication and any other suggestions.    Confirmed importance and date/time of follow-up visits scheduled yes Provider Appointment booked with Dr. Einar Pheasant on 02/26/2011.  Confirmed with patient if condition begins to worsen call PCP or go to the ER.  Patient was given the office number and encouraged to call back with question or concerns.  : yes

## 2019-02-20 NOTE — Telephone Encounter (Signed)
Please review patient's concern comment below. Thank you.

## 2019-02-20 NOTE — Telephone Encounter (Signed)
Per chart it seems lasix was held due to fluid loss from diarrhea/nausea/vomiting.   Would recommend continuing to hold lasix.   Check weight daily. If gaining weight, then call the clinic or cardiology to discuss restarting Lasix.   [name]

## 2019-02-23 NOTE — Telephone Encounter (Signed)
FYI Spoke with patient. Patient does not have a scale at home to check her weight. Patient does not feel like she gained weight. Patient is still feeling weak but some better not worse. She is eating and drinking. Advised patient if she starts to feel worse or notices swelling or dyspnea that is not normal for her to let us know otherwise would follow up this week on 02/26/2019.

## 2019-02-26 ENCOUNTER — Ambulatory Visit (INDEPENDENT_AMBULATORY_CARE_PROVIDER_SITE_OTHER): Payer: Medicare HMO | Admitting: Family Medicine

## 2019-02-26 ENCOUNTER — Other Ambulatory Visit: Payer: Self-pay

## 2019-02-26 ENCOUNTER — Encounter: Payer: Self-pay | Admitting: Family Medicine

## 2019-02-26 VITALS — BP 136/82 | HR 96 | Temp 99.8°F | Resp 22 | Wt 222.1 lb

## 2019-02-26 DIAGNOSIS — D72829 Elevated white blood cell count, unspecified: Secondary | ICD-10-CM

## 2019-02-26 DIAGNOSIS — J441 Chronic obstructive pulmonary disease with (acute) exacerbation: Secondary | ICD-10-CM | POA: Diagnosis not present

## 2019-02-26 DIAGNOSIS — A084 Viral intestinal infection, unspecified: Secondary | ICD-10-CM

## 2019-02-26 DIAGNOSIS — I509 Heart failure, unspecified: Secondary | ICD-10-CM | POA: Diagnosis not present

## 2019-02-26 DIAGNOSIS — E876 Hypokalemia: Secondary | ICD-10-CM | POA: Diagnosis not present

## 2019-02-26 LAB — CBC WITH DIFFERENTIAL/PLATELET
Basophils Absolute: 0 10*3/uL (ref 0.0–0.1)
Basophils Relative: 0.2 % (ref 0.0–3.0)
Eosinophils Absolute: 0.1 10*3/uL (ref 0.0–0.7)
Eosinophils Relative: 0.6 % (ref 0.0–5.0)
HCT: 31.2 % — ABNORMAL LOW (ref 36.0–46.0)
Hemoglobin: 9.9 g/dL — ABNORMAL LOW (ref 12.0–15.0)
LYMPHS ABS: 1.8 10*3/uL (ref 0.7–4.0)
LYMPHS PCT: 13.6 % (ref 12.0–46.0)
MCHC: 31.8 g/dL (ref 30.0–36.0)
MCV: 77.2 fl — AB (ref 78.0–100.0)
MONOS PCT: 5.6 % (ref 3.0–12.0)
Monocytes Absolute: 0.7 10*3/uL (ref 0.1–1.0)
Neutro Abs: 10.7 10*3/uL — ABNORMAL HIGH (ref 1.4–7.7)
Neutrophils Relative %: 80 % — ABNORMAL HIGH (ref 43.0–77.0)
Platelets: 266 10*3/uL (ref 150.0–400.0)
RBC: 4.04 Mil/uL (ref 3.87–5.11)
RDW: 15.1 % (ref 11.5–15.5)
WBC: 13.4 10*3/uL — ABNORMAL HIGH (ref 4.0–10.5)

## 2019-02-26 LAB — BASIC METABOLIC PANEL
BUN: 8 mg/dL (ref 6–23)
CHLORIDE: 96 meq/L (ref 96–112)
CO2: 31 mEq/L (ref 19–32)
Calcium: 8.5 mg/dL (ref 8.4–10.5)
Creatinine, Ser: 0.93 mg/dL (ref 0.40–1.20)
GFR: 60.38 mL/min (ref >60.00)
Glucose, Bld: 110 mg/dL — ABNORMAL HIGH (ref 70–99)
Potassium: 4.3 mEq/L (ref 3.5–5.1)
Sodium: 136 mEq/L (ref 135–145)

## 2019-02-26 LAB — MAGNESIUM: Magnesium: 1.8 mg/dL (ref 1.5–2.5)

## 2019-02-26 MED ORDER — ALBUTEROL SULFATE HFA 108 (90 BASE) MCG/ACT IN AERS: 2.0000 | INHALATION_SPRAY | Freq: Four times a day (QID) | RESPIRATORY_TRACT | 2 refills | Status: DC | PRN

## 2019-02-26 NOTE — Assessment & Plan Note (Signed)
Restart Lasix at  dose as appears euvolemic today. Will slowly restart. Check labs today. Restart Mg. Return in 1 week, will reassess and repeat labs given hx of hypokalemia

## 2019-02-26 NOTE — Assessment & Plan Note (Signed)
Cont home O2 as short of breath in the office, still with some scattered wheezing. Albuterol prn. Can try to wean O2 at home. Will eventually need a walk test in the office before stopping. And plan for PFTs at that time.

## 2019-02-26 NOTE — Assessment & Plan Note (Signed)
Repeat CBC to follow-up leukocytosis, improved and eating/drinking.

## 2019-02-26 NOTE — Patient Instructions (Signed)
Written instructions provided.

## 2019-02-26 NOTE — Progress Notes (Signed)
Subjective:     Morgan [name] is a 68 y.o. female presenting for Hospitalization Follow-up     HPI   #COPD - Still using O2 at home - Feels she is improving - Did not bring o2 today b/c of no travel tank - Feeling very weak - Hospital did not prescribe inhalers for home - Unclear if breathing treatments in the hospital were helping #GI illness - n/v resolved - normal bm - eating and drinking ok - no abdominal pain  #CHF - still holding Lasix - no swelling - no worsening breathing - wants to know when she will be able to stop O2   Review of Systems  Respiratory: Positive for shortness of breath. Negative for cough.   Cardiovascular: Negative for chest pain and leg swelling.   Hospital Chart Reviewed 02/16/2019-02/19/2019: Hospital: viral gastroenteritis, COPD exacerbation, leukocytosis, hypokalemia. Discharged on O2. Lasix held  Social History   Tobacco Use  Smoking Status Former Smoker  . Packs/day: 1.00  . Years: 15.00  . Pack years: 15.00  . Types: Cigarettes  . Last attempt to quit: 09/12/2017  . Years since quitting: 1.4  Smokeless Tobacco Never Used        Objective:    BP Readings from Last 3 Encounters:  02/26/19 136/82  02/19/19 125/84  02/16/19 (!) 156/92   Wt Readings from Last 3 Encounters:  02/26/19 222 lb 1.9 oz (100.8 kg)  02/16/19 219 lb (99.3 kg)  02/16/19 219 lb 12 oz (99.7 kg)    BP 136/82   Pulse 96   Temp 99.8 F (37.7 C)   Resp (!) 22   Wt 222 lb 1.9 oz (100.8 kg)   SpO2 93%   BMI 29.31 kg/m    Physical Exam Constitutional:      General: She is not in acute distress.    Appearance: She is well-developed. She is not diaphoretic.  HENT:     Head: Normocephalic and atraumatic.     Right Ear: Tympanic membrane and ear canal normal.     Left Ear: Tympanic membrane and ear canal normal.     Nose: Mucosal edema and rhinorrhea present.     Right Sinus: No maxillary sinus tenderness or frontal sinus tenderness.     Left  Sinus: No maxillary sinus tenderness or frontal sinus tenderness.     Mouth/Throat:     Pharynx: Uvula midline. Posterior oropharyngeal erythema present. No oropharyngeal exudate.     Tonsils: Swelling: 0 on the right. 0 on the left.  Eyes:     General: No scleral icterus.    Conjunctiva/sclera: Conjunctivae normal.  Neck:     Musculoskeletal: Neck supple.     Vascular: No JVD.  Cardiovascular:     Rate and Rhythm: Normal rate and regular rhythm.     Heart sounds: Murmur present.  Pulmonary:     Effort: Pulmonary effort is normal. No respiratory distress.     Breath sounds: Wheezing present. No rales.  Abdominal:     General: Abdomen is flat. There is no distension.     Tenderness: There is abdominal tenderness.  Musculoskeletal:     Right lower leg: No edema.     Left lower leg: No edema.  Lymphadenopathy:     Cervical: No cervical adenopathy.  Skin:    General: Skin is warm and dry.     Capillary Refill: Capillary refill takes less than 2 seconds.  Neurological:     Mental Status: She is alert.  Assessment & Plan:   Problem List Items Addressed This Visit      Cardiovascular and Mediastinum   CHF (congestive heart failure) (HCC)    Restart Lasix at  dose as appears euvolemic today. Will slowly restart. Check labs today. Restart Mg. Return in 1 week, will reassess and repeat labs given hx of hypokalemia      Relevant Orders   Magnesium     Respiratory   COPD exacerbation ([place]) - Primary    Cont home O2 as short of breath in the office, still with some scattered wheezing. Albuterol prn. Can try to wean O2 at home. Will eventually need a walk test in the office before stopping. And plan for PFTs at that time.       Relevant Medications   albuterol (PROVENTIL HFA;VENTOLIN HFA) 108 (90 Base) MCG/ACT inhaler     Digestive   Viral gastroenteritis    Repeat CBC to follow-up leukocytosis, improved and eating/drinking.         Other   Hypokalemia    Relevant Orders   Basic metabolic panel    Other Visit Diagnoses    Leukocytosis, unspecified type       Relevant Orders   CBC with Differential       Return in about 1 week (around 03/05/2019).  [name], MD

## 2019-03-02 ENCOUNTER — Telehealth: Payer: Self-pay | Admitting: Family Medicine

## 2019-03-02 DIAGNOSIS — I509 Heart failure, unspecified: Secondary | ICD-10-CM

## 2019-03-02 DIAGNOSIS — J441 Chronic obstructive pulmonary disease with (acute) exacerbation: Secondary | ICD-10-CM

## 2019-03-02 NOTE — Telephone Encounter (Signed)
Need new referral for pt to start care with [facility] in [place] due to pt will be moved. Please advise.

## 2019-03-02 NOTE — Telephone Encounter (Signed)
Spoke with Erline Levine at [facility]. Patient has been seen them per patient's previous PCP orders in [place] location. Now patient lives in [place] area and will have home health from [facility] there. Because the original order is old they need a new one. They were able to pull recent hospital notes and last office visit note with [place]. Need order for home health and for services that provider thinks are necessary (PT, OT or SN or all). Order along with insurance, demographic, medication list information to be faxed to [phone]. Please review.

## 2019-03-02 NOTE — Telephone Encounter (Signed)
done

## 2019-03-04 ENCOUNTER — Telehealth: Payer: Self-pay | Admitting: Family Medicine

## 2019-03-04 NOTE — Telephone Encounter (Signed)
Left detailed message advising patient of what Dr. Einar Pheasant said to her earlier. She will not be charged for missed or no show fee, or if she does not cancel appointment on time. Left all of the information on voicemail. Dr. Einar Pheasant is gone for the day and I am leaving now also and will not be back until tomorrow. Below are recommendations from Dr. Einar Pheasant from previous conversation  "Advised discussing symptoms with home health nurse and having vitals checked.   If [facility] nurse feels she should be seen - then keep appointment.   If [facility] nurse feels she can manage at home and will have another visit - postpone appointment.   If at any point she is worsening or concerned -- do not hesitate to keep appointment. Given current COVID pandemic and her comorbidities and age would ideally be isolated as much as possible. "

## 2019-03-04 NOTE — Telephone Encounter (Signed)
Heath Gold nurse with [facility] left v/m requesting cb with update on pt.

## 2019-03-04 NOTE — Telephone Encounter (Signed)
Pt returned your call.  

## 2019-03-04 NOTE — Telephone Encounter (Signed)
Called to remind patient that she is scheduled to come in tomorrow morning. She asked about the nurse today and I found phone note. Asked her if she was concerned or worse and if she wanted to keep appointment. She said she is unsure and wants to be advised on what to do.

## 2019-03-04 NOTE — Telephone Encounter (Signed)
-----   Message from [facility] V sent at 03/03/2019  2:28 PM EDT ----- Regarding: RE: cancel appointment Spoke with patient and advised of recommendations. Patient would like to speak with you tomorrow if you can. Have not cancelled appointment yet. I asked her how she was feeling and she said she had a couple good days but overall just feels weak/washed out. Not able to sleep at night, tossing and turning and she is not sure why. ----- Message ----- From: [name], MD Sent: 03/03/2019   1:06 PM EDT To: Tiffany Kocher V Subject: cancel appointment                             Can you call her and see if we can cancel her appointment? I also just put in those orders for home health nursing and with the virus she is such high risk if she is doing OK she should stay home to avoid exposures.  I can also call her tomorrow if she has more questions.

## 2019-03-04 NOTE — Telephone Encounter (Signed)
Still feeling very weak Cannot sleep  Is having a home health nurse come today  Is drinking lots of fluids and eating well, overall feeling weak and low energy No fever or cough Just feeling very weak Does not feel worse than when she left the hospital Endorses a headache - taking tylenol and Excedrin and HA still present Breathing is OK - still on the Oxygen  Advised discussing symptoms with home health nurse and having vitals checked.   If [facility] nurse feels she should be seen - then keep appointment.   If [facility] nurse feels she can manage at home and will have another visit - postpone appointment.   If at any point she is worsening or concerned -- do not hesitate to keep appointment. Given current COVID pandemic and her comorbidities and age would ideally be isolated as much as possible.   Morgan [name]

## 2019-03-04 NOTE — Telephone Encounter (Signed)
Called to speak with patient to check in. No answer, left voicemail to call back  02/26/2019: Clinic - restarted lasix at 1/2 dose. Still on home O2 (3L) though in clinic not using O2 and was SOB with O2 sats 93% on RA. Labs had normal Mg and Na. Down trending WBC.   03/02/2019: Home Health order - for nursing (monitor weights and O2) and PT order for strengthening.    Will try to call back later

## 2019-03-05 ENCOUNTER — Ambulatory Visit: Payer: Medicare HMO | Admitting: Family Medicine

## 2019-03-05 NOTE — Telephone Encounter (Signed)
Spoke with Rachel Bo, home health nurse. She saw patient yesterday-03/04/2019. States patient's vital were good, no fever. She does have SOB but it is not worse since her last office visit, staying the same. Overall looked pretty good just could tell even a little bit of movement was making her short of breath. She is using oxygen. On the exam nurse does mention hearing a little bit of wheezing and wetness on the right side, mainly middle and upper right lobe.

## 2019-03-05 NOTE — Telephone Encounter (Signed)
Please relay to home health  If no improvement on next home health visit, would recommend appointment.   If worsening - recommend appointment  Morgan [name]

## 2019-03-05 NOTE — Telephone Encounter (Signed)
Spoke with Ovid Curd and advised as below. She will speak with patient. She did tell patient yesterday that if she felt worse to let them know or to call us here. Next home health visit is scheduled for 03/11/2019 but she will see if this could be moved up to 03/09/2019. She will let us know if anything changes

## 2019-03-05 NOTE — Telephone Encounter (Signed)
Left message for Rachel Bo to call back

## 2019-03-09 ENCOUNTER — Telehealth: Payer: Self-pay | Admitting: *Deleted

## 2019-03-09 NOTE — Telephone Encounter (Signed)
Annett Gula Speech therapist with [facility] left a voicemail requesting orders for approval for speech therapy because patient had a  stroke. Malachy Mood stated that patient is having difficulty with her voice, difficulty with swallowing and cognitive deficit. Malachy Mood would like a call back with verbal orders.

## 2019-03-09 NOTE — Telephone Encounter (Signed)
OK for SLP evaluation.   Morgan [name]

## 2019-03-09 NOTE — Telephone Encounter (Signed)
Cheryl advised

## 2019-03-17 IMAGING — CR DG RIBS W/ CHEST 3+V*L*
5 series · 5 of 5 positions shown · non-contrast
Comparison: Chest x-ray 09/06/2017 .

CLINICAL DATA: Fall on ice.  Left anterior rib pain .

EXAM:
LEFT RIBS AND CHEST - 3+ VIEW

[chest pa]
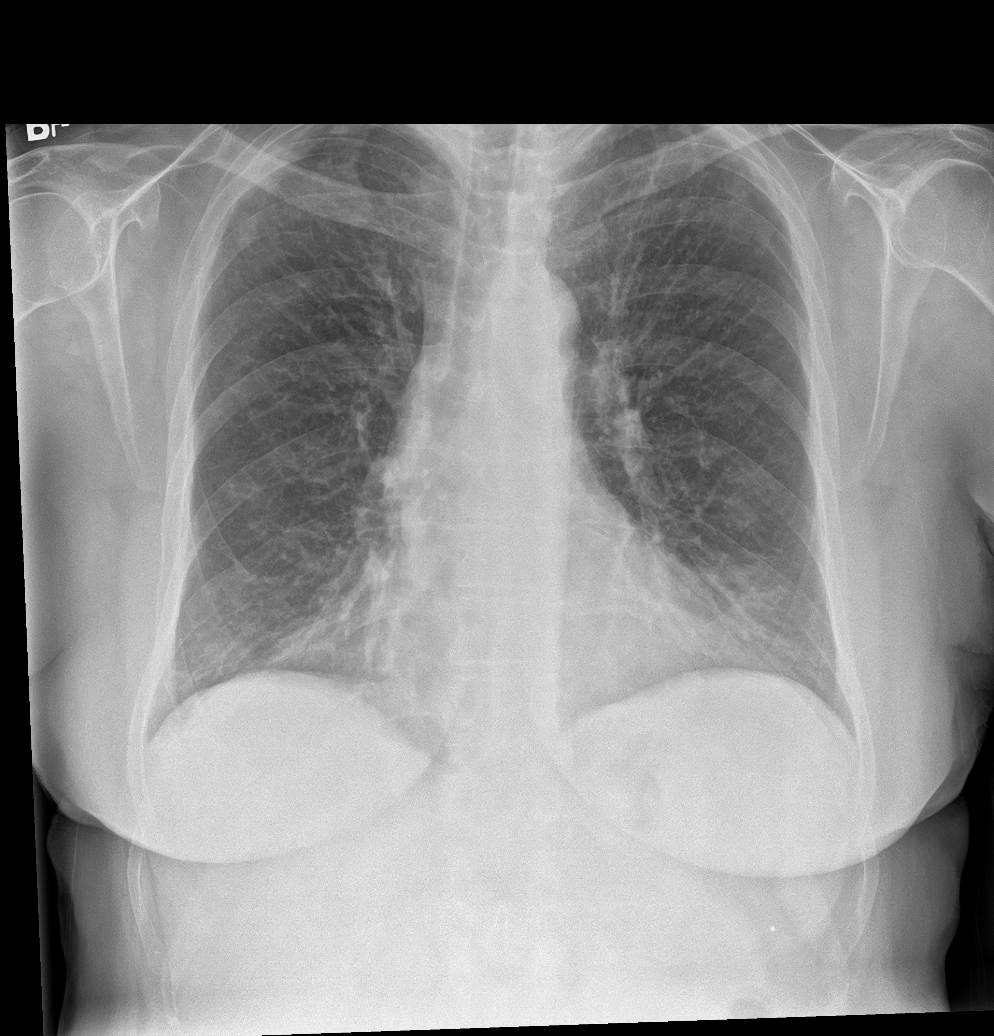

[rib pa (1 of 2)]
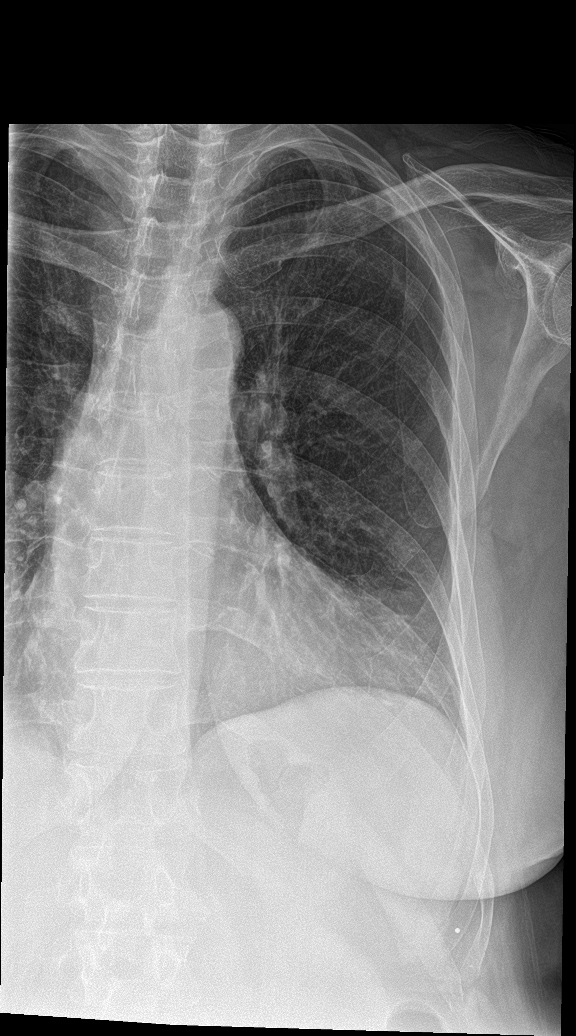

[rib pa obl]
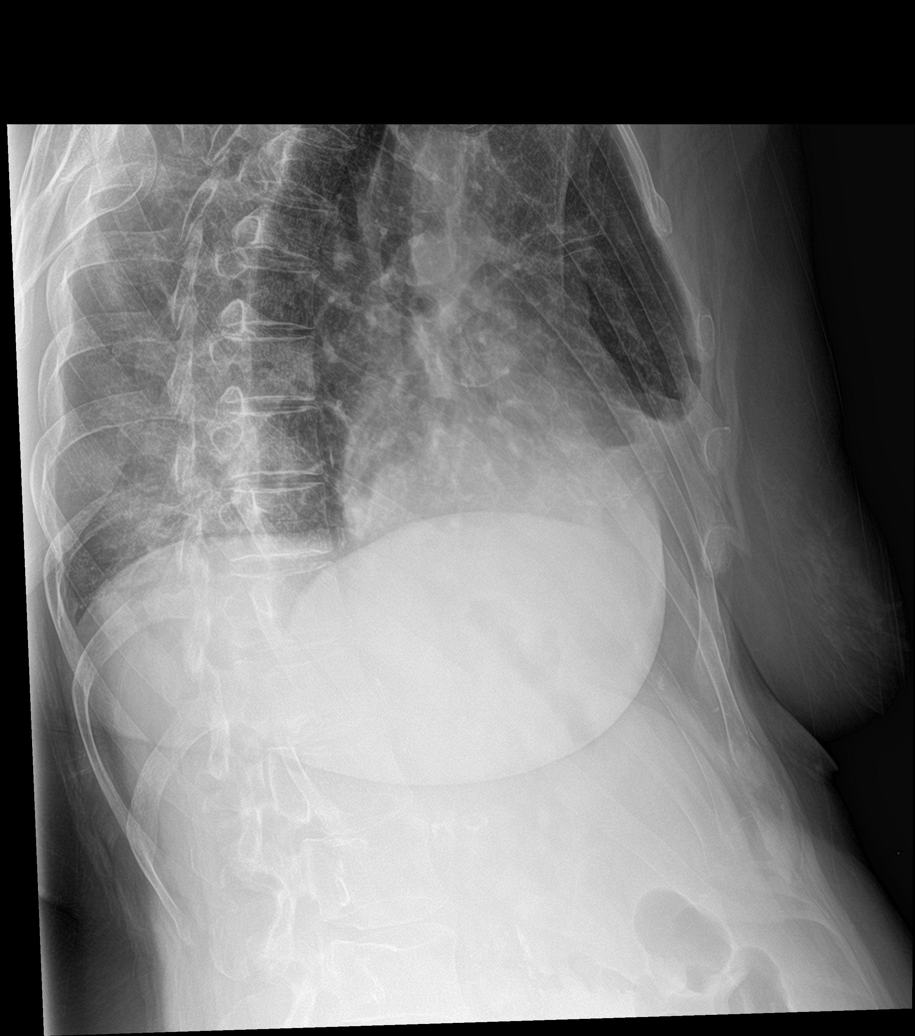

[rib ap obl]
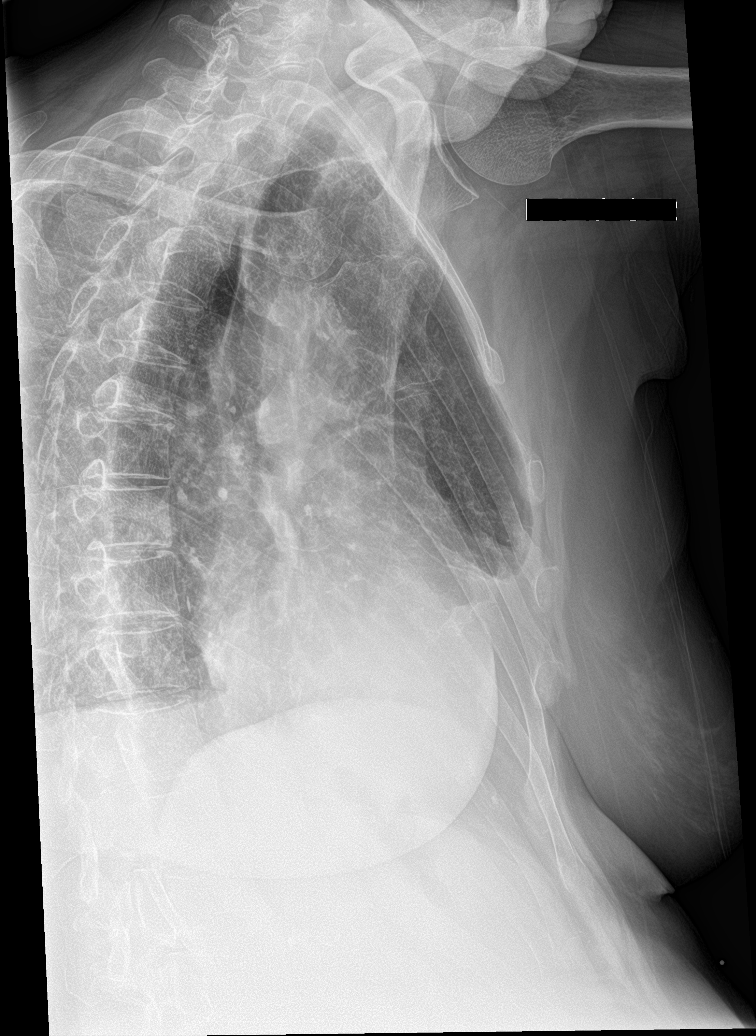

[rib pa (2 of 2)]
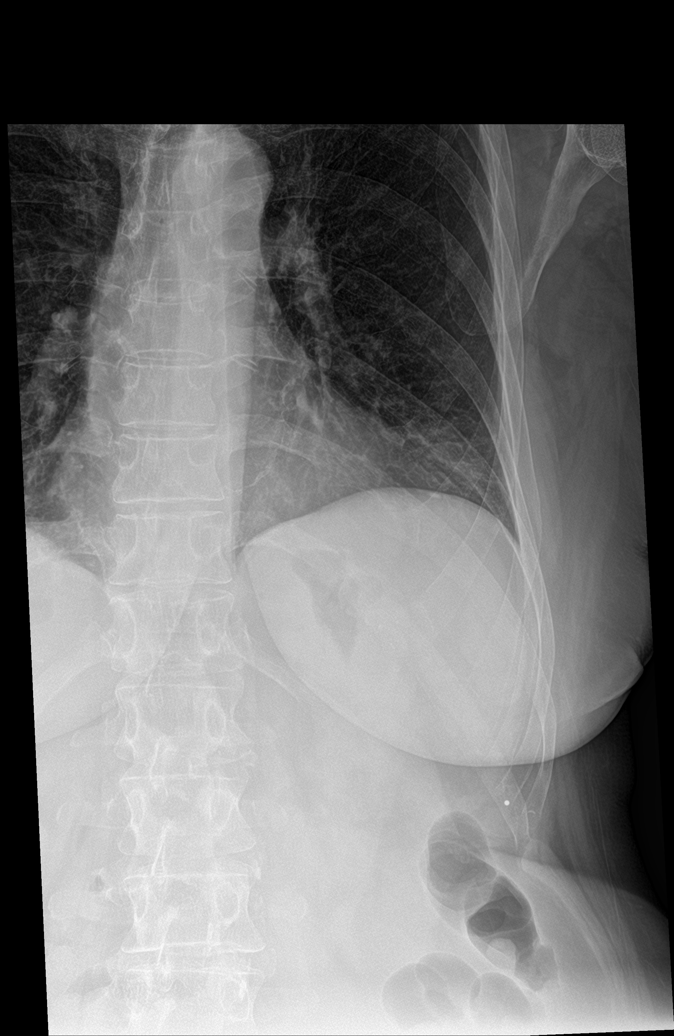

[5 of 5 positions shown; findings below may reference images not displayed]

FINDINGS: Mild basilar atelectasis. Interval clearing of bilateral pulmonary
infiltrates noted on chest x-ray of 09/06/2017. Heart size stable.
Diffuse osteopenia. No evidence of displaced rib fracture or
pneumothorax. Left anterolateral seventh rib fracture cannot be
completely excluded.
IMPRESSION: Left anterolateral left seventh rib nondisplaced fracture cannot be
completely excluded . No pneumothorax.

## 2019-04-07 ENCOUNTER — Telehealth: Payer: Self-pay

## 2019-04-07 DIAGNOSIS — W19XXXA Unspecified fall, initial encounter: Secondary | ICD-10-CM

## 2019-04-07 DIAGNOSIS — D72829 Elevated white blood cell count, unspecified: Secondary | ICD-10-CM

## 2019-04-07 DIAGNOSIS — I509 Heart failure, unspecified: Secondary | ICD-10-CM

## 2019-04-07 NOTE — Telephone Encounter (Signed)
Left message for patient to call back  

## 2019-04-07 NOTE — Telephone Encounter (Signed)
Called patient.   She said that the fall occurred around 2:30 am and she does not recall the details of what happened.   Advised that she have a visit and get some repeat labs to make sure we aren't missing anything. Reassuring that she has normal vital signs.   Routing to MA to schedule virtual visit and labs before the visit. Will also get a urine given prior asymptomatic bacteremia to follow-up.

## 2019-04-07 NOTE — Telephone Encounter (Signed)
Cheryl speech therapist with [facility] said that pt fell this morning and 2:30. Pt has no visible injury and vitals are normal. FYI to Dr Einar Pheasant.

## 2019-04-07 NOTE — Telephone Encounter (Signed)
Spoke with patient. Patient does not have video capabilities, no smart phone or tablet. Phone visit scheduled for 04/08/2019 and labs to be done prior to that

## 2019-04-08 ENCOUNTER — Other Ambulatory Visit: Payer: Self-pay

## 2019-04-08 ENCOUNTER — Encounter: Payer: Self-pay | Admitting: Family Medicine

## 2019-04-08 ENCOUNTER — Telehealth: Payer: Self-pay

## 2019-04-08 ENCOUNTER — Other Ambulatory Visit (INDEPENDENT_AMBULATORY_CARE_PROVIDER_SITE_OTHER): Payer: Medicare HMO

## 2019-04-08 ENCOUNTER — Ambulatory Visit (INDEPENDENT_AMBULATORY_CARE_PROVIDER_SITE_OTHER): Payer: Medicare HMO | Admitting: Family Medicine

## 2019-04-08 VITALS — Ht 70.5 in

## 2019-04-08 DIAGNOSIS — I509 Heart failure, unspecified: Secondary | ICD-10-CM

## 2019-04-08 DIAGNOSIS — R8271 Bacteriuria: Secondary | ICD-10-CM

## 2019-04-08 DIAGNOSIS — W19XXXA Unspecified fall, initial encounter: Secondary | ICD-10-CM | POA: Insufficient documentation

## 2019-04-08 DIAGNOSIS — D72829 Elevated white blood cell count, unspecified: Secondary | ICD-10-CM | POA: Diagnosis not present

## 2019-04-08 DIAGNOSIS — J441 Chronic obstructive pulmonary disease with (acute) exacerbation: Secondary | ICD-10-CM | POA: Diagnosis not present

## 2019-04-08 LAB — CBC WITH DIFFERENTIAL/PLATELET
Basophils Absolute: 0 10*3/uL (ref 0.0–0.1)
Basophils Relative: 0.3 % (ref 0.0–3.0)
Eosinophils Absolute: 0.3 10*3/uL (ref 0.0–0.7)
Eosinophils Relative: 3.8 % (ref 0.0–5.0)
HCT: 32.3 % — ABNORMAL LOW (ref 36.0–46.0)
Hemoglobin: 10.4 g/dL — ABNORMAL LOW (ref 12.0–15.0)
Lymphocytes Relative: 29.8 % (ref 12.0–46.0)
Lymphs Abs: 2.6 10*3/uL (ref 0.7–4.0)
MCHC: 32.3 g/dL (ref 30.0–36.0)
MCV: 77.3 fl — ABNORMAL LOW (ref 78.0–100.0)
Monocytes Absolute: 0.4 10*3/uL (ref 0.1–1.0)
Monocytes Relative: 4.4 % (ref 3.0–12.0)
Neutro Abs: 5.4 10*3/uL (ref 1.4–7.7)
Neutrophils Relative %: 61.7 % (ref 43.0–77.0)
Platelets: 294 10*3/uL (ref 150.0–400.0)
RBC: 4.17 Mil/uL (ref 3.87–5.11)
RDW: 17.3 % — ABNORMAL HIGH (ref 11.5–15.5)
WBC: 8.8 10*3/uL (ref 4.0–10.5)

## 2019-04-08 LAB — POCT URINALYSIS DIPSTICK
Bilirubin, UA: NEGATIVE
Blood, UA: POSITIVE
Glucose, UA: NEGATIVE
Ketones, UA: NEGATIVE
Nitrite, UA: POSITIVE
Protein, UA: POSITIVE — AB
Spec Grav, UA: 1.025 (ref 1.010–1.025)
Urobilinogen, UA: 0.2 E.U./dL
pH, UA: 6 (ref 5.0–8.0)

## 2019-04-08 LAB — COMPREHENSIVE METABOLIC PANEL
ALT: 10 U/L (ref 0–35)
AST: 14 U/L (ref 0–37)
Albumin: 3.5 g/dL (ref 3.5–5.2)
Alkaline Phosphatase: 120 U/L — ABNORMAL HIGH (ref 39–117)
BUN: 18 mg/dL (ref 6–23)
CO2: 33 mEq/L — ABNORMAL HIGH (ref 19–32)
Calcium: 8.8 mg/dL (ref 8.4–10.5)
Chloride: 100 mEq/L (ref 96–112)
Creatinine, Ser: 1.02 mg/dL (ref 0.40–1.20)
GFR: 54.25 mL/min — ABNORMAL LOW (ref >60.00)
Glucose, Bld: 122 mg/dL — ABNORMAL HIGH (ref 70–99)
Potassium: 3.8 mEq/L (ref 3.5–5.1)
Sodium: 139 mEq/L (ref 135–145)
Total Bilirubin: 0.3 mg/dL (ref 0.2–1.2)
Total Protein: 6.6 g/dL (ref 6.0–8.3)

## 2019-04-08 LAB — MAGNESIUM: Magnesium: 1.8 mg/dL (ref 1.5–2.5)
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
## 2019-04-08 NOTE — Assessment & Plan Note (Signed)
Slowly weaning off O2 with home health support. Still using at night.

## 2019-04-08 NOTE — Telephone Encounter (Signed)
Spoke with Charlene who is at the patient's home now and she will call back with vitals.
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
## 2019-04-08 NOTE — Telephone Encounter (Signed)
Pulse was not checked by the nurse.

## 2019-04-08 NOTE — Assessment & Plan Note (Signed)
Seems most consistent with orthostatic syncope possibly based on middle of the night and getting up. Will see if home health can obtain orthostatic vital signs today. Advised slow transitions. If persisting or recurrent will need follow-up appointment and possibly cardiology appointment

## 2019-04-08 NOTE — Telephone Encounter (Signed)
Morgan [name] called with b/p readings: Laying 130/64, sitting 126/68, standing 122/60.

## 2019-04-08 NOTE — Telephone Encounter (Signed)
Left message for  [facility] nurse with [facility] [phone]   To call back. Dr. Einar Pheasant wants patient to have orthostatic vitals done today and let us know what they are. Patient said her nurse would be coming out to her home today around 2:30 pm and per last note in march Janeese was the nurse.
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
## 2019-04-08 NOTE — Assessment & Plan Note (Signed)
Weight stable, denies fluid build up on legs. Will follow-up labs today. Cont current medication

## 2019-04-09 ENCOUNTER — Telehealth: Payer: Self-pay | Admitting: Family Medicine

## 2019-04-09 ENCOUNTER — Other Ambulatory Visit (INDEPENDENT_AMBULATORY_CARE_PROVIDER_SITE_OTHER): Payer: Medicare HMO

## 2019-04-09 DIAGNOSIS — D509 Iron deficiency anemia, unspecified: Secondary | ICD-10-CM

## 2019-04-09 DIAGNOSIS — D649 Anemia, unspecified: Secondary | ICD-10-CM | POA: Diagnosis not present

## 2019-04-09 LAB — FERRITIN: Ferritin: 40.5 ng/mL (ref 10.0–291.0)

## 2019-04-09 MED ORDER — IRON 325 (65 FE) MG PO TABS: 1.0000 | ORAL_TABLET | Freq: Every day | ORAL | 0 refills | Status: DC

## 2019-04-09 NOTE — Telephone Encounter (Signed)
Called to review labs.   Overall normal liver, kidney, electrolytes.   Microcytic anemia, ferritin is normal but still feel iron replacement trial may be helpful.   Repeat anemia labs in 4 weeks.

## 2019-04-09 NOTE — Telephone Encounter (Signed)
Reassuring vital signs.   Will continue to follow if recurrent falls.

## 2019-04-10 LAB — URINE CULTURE
MICRO NUMBER:: 413861
SPECIMEN QUALITY:: ADEQUATE

## 2019-04-14 ENCOUNTER — Telehealth: Payer: Self-pay

## 2019-04-14 NOTE — Telephone Encounter (Signed)
Ms Rosana Hoes with [facility] left v/m that pt was due for d/c today from [facility] nursing for COPD mgt but due to interruption of getting out supplies pt does not have pulse ox or BP cuff. Ms Rosana Hoes requesting to continue [facility] nursing for COPD education program for 1 x a wk for 2 wks.

## 2019-04-14 NOTE — Telephone Encounter (Signed)
Agree with continuing [place], as requested  Anything we can do to help with pulse ox or BP cuff?

## 2019-04-14 NOTE — Telephone Encounter (Signed)
Left detailed message for Ms Rosana Hoes in regards to orders and if there is anything we can do to help with supplies and if there is to let us know.

## 2019-04-28 IMAGING — CR DG CHEST 2V
2 series · 2 of 2 positions shown · non-contrast
Comparison: November 28, 2017

CLINICAL DATA: Shortness of breath and chest pain

EXAM:
CHEST  2 VIEW

[chest lat]
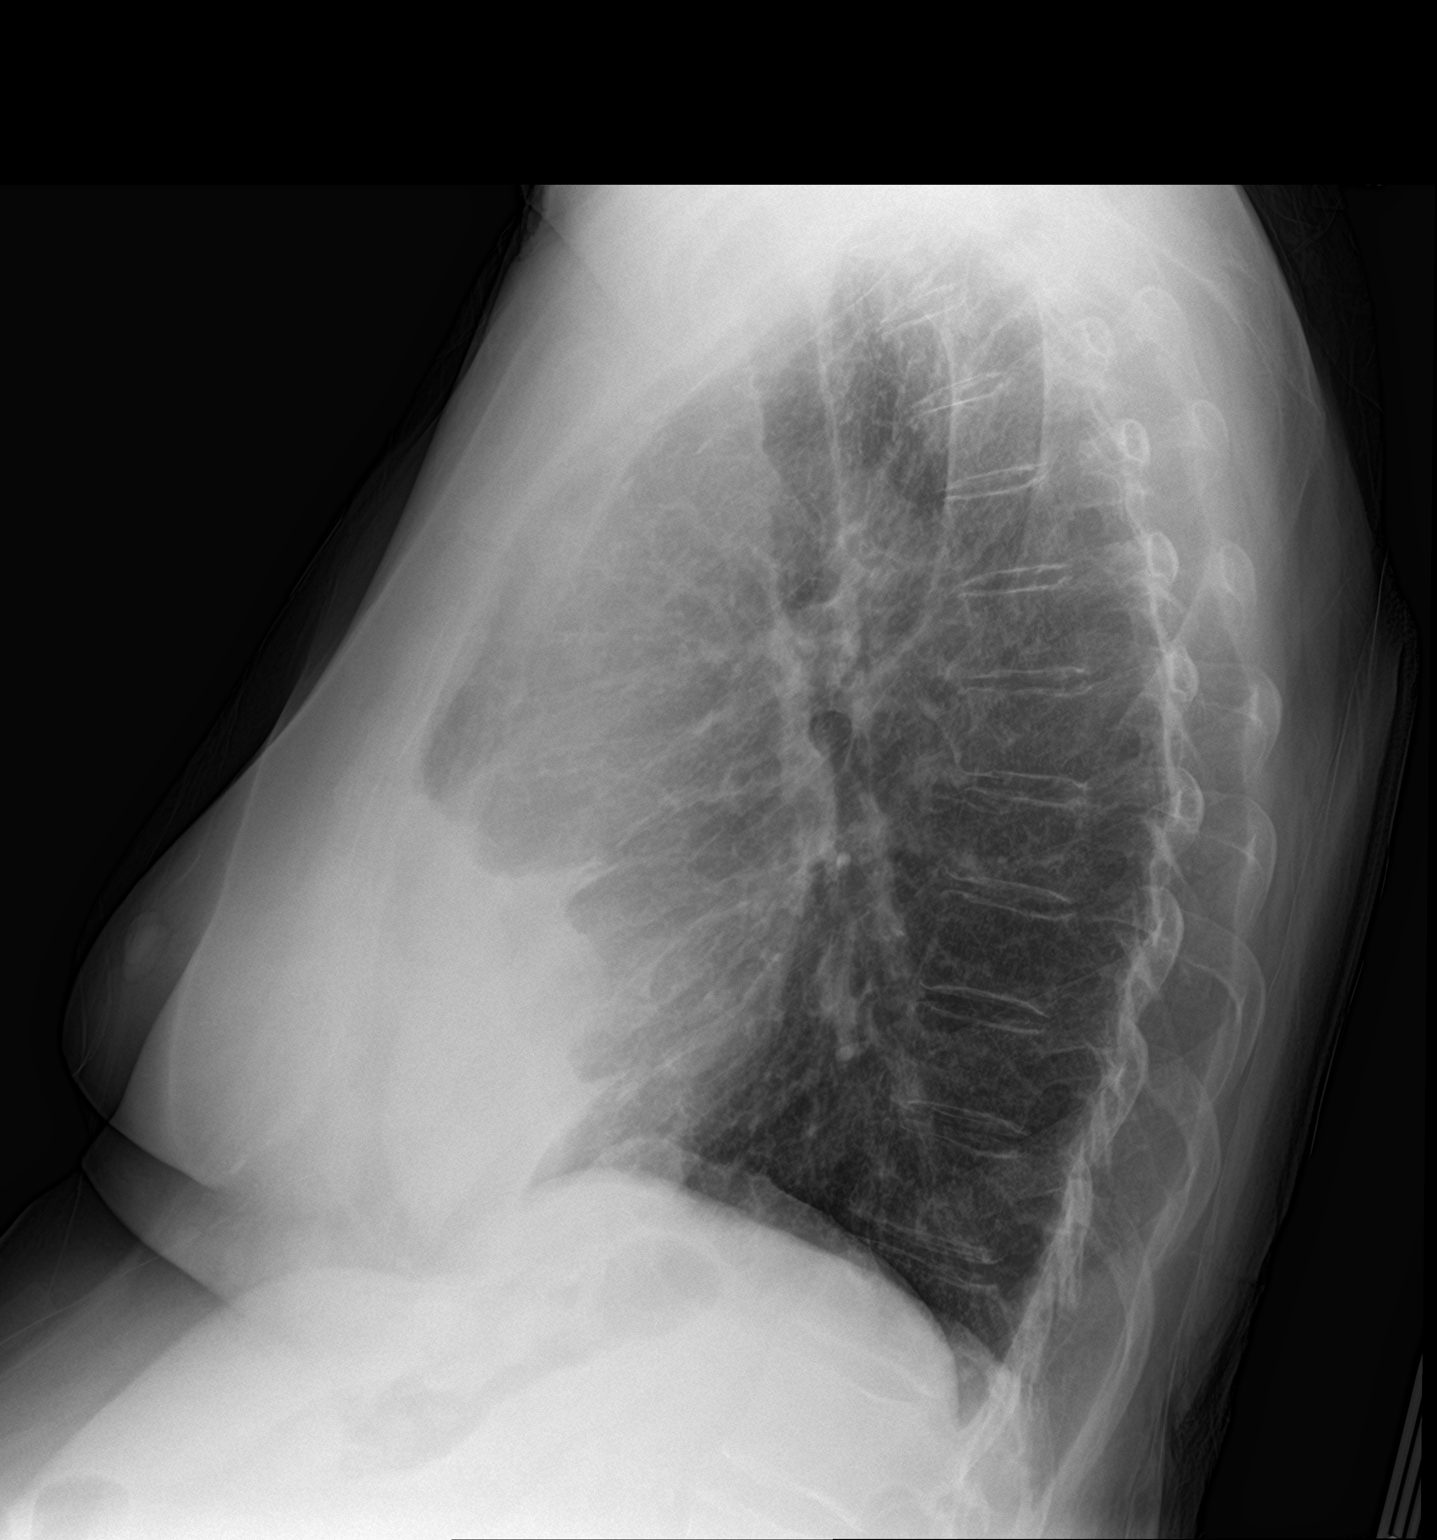

[chest ap]
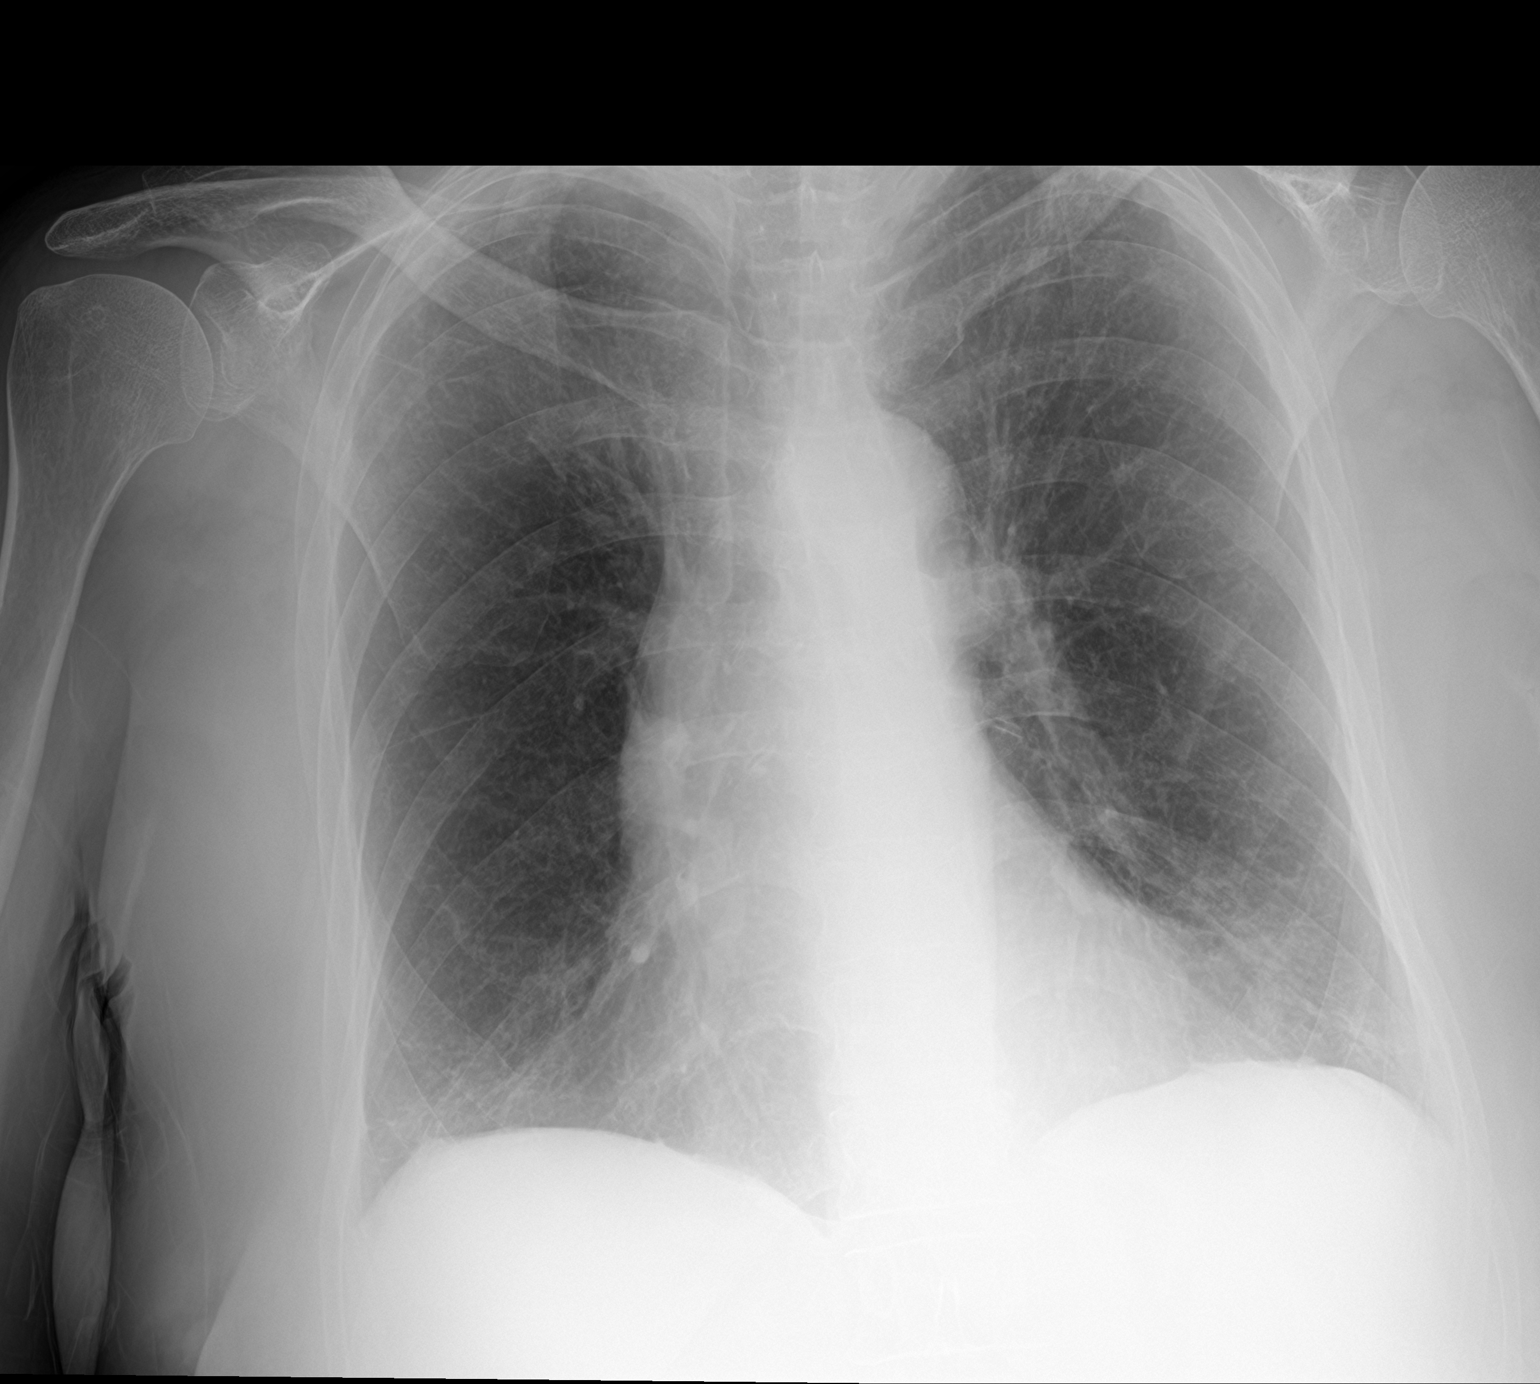

[2 of 2 positions shown; findings below may reference images not displayed]

FINDINGS: There is no edema or consolidation. The heart size and pulmonary
vascularity are normal. No adenopathy. No pneumothorax. No bone
lesions. There is epicardial fat prominence, a benign finding.
IMPRESSION: No edema or consolidation.

## 2019-04-29 ENCOUNTER — Other Ambulatory Visit: Payer: Self-pay | Admitting: Family Medicine

## 2019-04-29 DIAGNOSIS — D649 Anemia, unspecified: Secondary | ICD-10-CM

## 2019-04-29 NOTE — Progress Notes (Unsigned)
Placing orders to follow-up on anemia

## 2019-04-30 ENCOUNTER — Other Ambulatory Visit: Payer: Medicare HMO

## 2019-05-07 ENCOUNTER — Other Ambulatory Visit (INDEPENDENT_AMBULATORY_CARE_PROVIDER_SITE_OTHER): Payer: Medicare HMO

## 2019-05-07 DIAGNOSIS — D649 Anemia, unspecified: Secondary | ICD-10-CM | POA: Diagnosis not present

## 2019-05-07 LAB — CBC
HCT: 31.8 % — ABNORMAL LOW (ref 36.0–46.0)
Hemoglobin: 10.5 g/dL — ABNORMAL LOW (ref 12.0–15.0)
MCHC: 33.1 g/dL (ref 30.0–36.0)
MCV: 77.9 fl — ABNORMAL LOW (ref 78.0–100.0)
Platelets: 305 10*3/uL (ref 150.0–400.0)
RBC: 4.09 Mil/uL (ref 3.87–5.11)
RDW: 17.2 % — ABNORMAL HIGH (ref 11.5–15.5)
WBC: 5.6 10*3/uL (ref 4.0–10.5)

## 2019-05-07 LAB — FERRITIN: Ferritin: 24.6 ng/mL (ref 10.0–291.0)

## 2019-05-07 LAB — IBC PANEL
Iron: 164 ug/dL — ABNORMAL HIGH (ref 42–145)
Saturation Ratios: 54.7 % — ABNORMAL HIGH (ref 20.0–50.0)
Transferrin: 214 mg/dL (ref 212.0–360.0)

## 2019-07-02 ENCOUNTER — Other Ambulatory Visit: Payer: Self-pay | Admitting: Family Medicine

## 2019-07-02 DIAGNOSIS — D509 Iron deficiency anemia, unspecified: Secondary | ICD-10-CM

## 2019-07-02 NOTE — Telephone Encounter (Signed)
Per last lab result note patient was advised to stop taking Iron supplements due to her levels were normal. Refill denied at this time

## 2019-07-15 ENCOUNTER — Ambulatory Visit (INDEPENDENT_AMBULATORY_CARE_PROVIDER_SITE_OTHER): Payer: Medicare HMO | Admitting: Family Medicine

## 2019-07-15 ENCOUNTER — Telehealth: Payer: Self-pay

## 2019-07-15 ENCOUNTER — Other Ambulatory Visit: Payer: Self-pay

## 2019-07-15 ENCOUNTER — Encounter: Payer: Self-pay | Admitting: Family Medicine

## 2019-07-15 VITALS — BP 140/82 | HR 67 | Temp 98.7°F | Ht 70.5 in | Wt 230.5 lb

## 2019-07-15 DIAGNOSIS — S29012A Strain of muscle and tendon of back wall of thorax, initial encounter: Secondary | ICD-10-CM

## 2019-07-15 MED ORDER — TIZANIDINE HCL 4 MG PO TABS: 4.0000 mg | ORAL_TABLET | Freq: Every evening | ORAL | 0 refills | Status: AC | PRN

## 2019-07-15 MED ORDER — TRAMADOL HCL 50 MG PO TABS: 50.0000 mg | ORAL_TABLET | Freq: Three times a day (TID) | ORAL | 0 refills | Status: AC | PRN

## 2019-07-15 MED ORDER — PREDNISONE 20 MG PO TABS: ORAL_TABLET | ORAL | 0 refills | Status: DC

## 2019-07-15 NOTE — Progress Notes (Signed)
Morgan Farace T. Burley Kopka, MD Primary Care and Sports Medicine [facility] at [facility] [place], [address] Phone: [phone]  FAX: [phone]  Morgan [name] - 68 y.o. female  MRN [id number]  Date of Birth: 1951-07-02  Visit Date: 07/15/2019  PCP: Morgan Noe, MD  Referred by: Morgan Noe, MD  Chief Complaint  Patient presents with  . Back Pain    Upper Back   Subjective:   Morgan [name] is a 68 y.o. very pleasant female patient with Body mass index is 32.61 kg/m. who presents with the following:  Sneezing and had a lot of pain in her back.  After her sneeze she developed some acute pain in the thoracic spine region, in the musculature.  This is been ongoing since yesterday and caper awake at night last night.  She is not having any numbness, tingling, weakness or other associated symptoms.  She has tried a warm shower and a heating pad, this did not really provide much relief.  She is also tried some aspirin as well as ibuprofen, and this did not help all that well.  Hurts worse when she lays down.  No back surgery.   Lower thoracic.  Tried a heating pad and has been taking some aspirin and ibuprofen.  Normally sleeps OK    Past Medical History, Surgical History, Social History, Family History, Problem List, Medications, and Allergies have been reviewed and updated if relevant.  Patient Active Problem List   Diagnosis Date Noted  . Fall 04/08/2019  . Abdominal pain 02/19/2019  . Viral gastroenteritis 02/19/2019  . Asymptomatic bacteriuria 02/19/2019  . COPD exacerbation ([place]) 02/16/2019  . CKD (chronic kidney disease) stage 3, GFR 30-59 ml/min (HCC) 01/13/2019  . CHF (congestive heart failure) ([place]) 01/13/2019  . Dizziness 01/13/2019  . Hoarseness 11/12/2018  . Oropharyngeal dysphagia 11/12/2018  . Sleepiness 11/12/2018  . CVA (cerebral vascular accident) ([place]) 08/22/2018  . Left-sided weakness 10/04/2017  . HLD  (hyperlipidemia) 10/04/2017  . GERD (gastroesophageal reflux disease) 10/04/2017  . Anxiety 08/26/2017  . Essential hypertension   . Depression   . Diarrhea 02/22/2015  . Hypokalemia 02/22/2015  . Chronic lower back pain 04/29/2013  . Paresthesia 04/29/2013    Past Medical History:  Diagnosis Date  . Allergy   . Anemia   . Anxiety   . Arthritis   . Depression   . GERD (gastroesophageal reflux disease)   . Hypertension   . MVP (mitral valve prolapse)     Past Surgical History:  Procedure Laterality Date  . ABDOMINAL HYSTERECTOMY    . APPENDECTOMY    . BREAST SURGERY Right 1988   tumor removal  . KNEE SURGERY  2005   2 times left  . TUBAL LIGATION      Social History   Socioeconomic History  . Marital status: Single    Spouse name: Not on file  . Number of children: 2  . Years of education: Not on file  . Highest education level: Not on file  Occupational History  . Not on file  Social Needs  . Financial resource strain: Not hard at all  . Food insecurity    Worry: Not on file    Inability: Not on file  . Transportation needs    Medical: Not on file    Non-medical: Not on file  Tobacco Use  . Smoking status: Former Smoker    Packs/day: 1.00    Years: 15.00  Pack years: 15.00    Types: Cigarettes    Quit date: 09/12/2017    Years since quitting: 1.8  . Smokeless tobacco: Never Used  Substance and Sexual Activity  . Alcohol use: Not Currently  . Drug use: No  . Sexual activity: Not Currently  Lifestyle  . Physical activity    Days per week: Not on file    Minutes per session: Not on file  . Stress: Not on file  Relationships  . Social Herbalist on phone: Not on file    Gets together: Not on file    Attends religious service: Not on file    Active member of club or organization: Not on file    Attends meetings of clubs or organizations: Not on file    Relationship status: Not on file  . Intimate partner violence    Fear of current  or ex partner: Not on file    Emotionally abused: Not on file    Physically abused: Not on file    Forced sexual activity: Not on file  Other Topics Concern  . 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 Breast cancer Mother 33  . Ovarian cancer Mother 37  . Brain cancer Mother   . Hypertension Father   . Hyperlipidemia Father   . Heart disease Father   . Breast cancer Maternal Grandmother 46  . Diabetes Paternal Grandmother   . Breast cancer Paternal Grandmother 75  . Cancer Paternal Grandfather   . Breast cancer Maternal Aunt 60  . Breast cancer Maternal Aunt 60    Allergies  Allergen Reactions  . Demerol [Meperidine] Hives  . Strawberry (Diagnostic) Hives    Medication list reviewed and updated in full in [facility].  GEN: No fevers, chills. Nontoxic. Primarily MSK c/o today. MSK: Detailed in the HPI GI: tolerating PO intake without difficulty Neuro: No numbness, parasthesias, or tingling associated. Otherwise the pertinent positives of the ROS are noted above.   Objective:   BP 140/82   Pulse 67   Temp 98.7 F (37.1 C) (Temporal)   Ht 5' 10.5" (1.791 m)   Wt 230 lb 8 oz (104.6 kg)   SpO2 98%   BMI 32.61 kg/m    GEN: WDWN, NAD, Non-toxic, Alert & Oriented x 3 HEENT: Atraumatic, Normocephalic.  Ears and Nose: No external deformity. EXTR: No clubbing/cyanosis/edema NEURO: Normal gait.  PSYCH: Normally interactive. Conversant. Not depressed or anxious appearing.  Calm demeanor.    She does have full  passive range of motion in the shoulder in all directions, but she does have pain in the thorax area with abduction.  Rotator cuff musculature is all 5/5.  She does have some pain in the rhomboid and lower trap region directly in the musculature but exclusive of the scapula and the spinous processes.  Radiology: No results found.  Assessment and Plan:     ICD-10-CM   1. Rhomboid muscle strain,  initial encounter  S29.012A    DOI: 07/14/2019  The patient is in fairly severe pain today in the office, and I am getting give her some prednisone for 10 days as well as some tramadol to use as needed.  Zanaflex hopefully will help her sleep and be comfortable at nighttime.  Follow-up: No follow-ups on file.  Meds ordered this encounter  Medications  . predniSONE (DELTASONE) 20 MG tablet    Sig: 2 tabs po daily for 5 days, then 1 tab po daily for 5 days    Dispense:  15 tablet    Refill:  0  . traMADol (ULTRAM) 50 MG tablet    Sig: Take 1 tablet (50 mg total) by mouth every 8 (eight) hours as needed for up to 5 days for moderate pain.    Dispense:  20 tablet    Refill:  0  . tiZANidine (ZANAFLEX) 4 MG tablet    Sig: Take 1 tablet (4 mg total) by mouth at bedtime as needed for muscle spasms.    Dispense:  30 tablet    Refill:  0   No orders of the defined types were placed in this encounter.   Signed,  Morgan Deed. Annslee Tercero, MD   Outpatient Encounter Medications as of 07/15/2019  Medication Sig  . acetaminophen (TYLENOL) 500 MG tablet Take 1,000 mg by mouth every 6 (six) hours as needed (pain).   Marland Kitchen albuterol (PROVENTIL HFA;VENTOLIN HFA) 108 (90 Base) MCG/ACT inhaler Inhale 2 puffs into the lungs every 6 (six) hours as needed for wheezing or shortness of breath.  Marland Kitchen aspirin EC 325 MG EC tablet Take 1 tablet (325 mg total) by mouth daily.  Marland Kitchen atorvastatin (LIPITOR) 40 MG tablet Take 40 mg by mouth at bedtime.  Marland Kitchen esomeprazole (NEXIUM) 40 MG capsule Take 40 mg by mouth at bedtime.  .  Ferrous Sulfate (IRON) 325 (65 Fe) MG TABS Take 1 tablet (325 mg total) by mouth daily.  . furosemide (LASIX) 40 MG tablet HOLD UNTIL seen by your doctor (Patient taking differently: Take 40 mg by mouth daily. )  . gabapentin (NEURONTIN) 400 MG capsule Take 400 mg by mouth 3 (three) times daily.   . mirtazapine (REMERON) 45 MG tablet Take 45 mg by mouth at bedtime.   . potassium chloride (K-DUR,KLOR-CON) 10 MEQ tablet Take 30 mEq by mouth 2 (two) times daily.   . predniSONE (DELTASONE) 20 MG tablet 2 tabs po daily for 5 days, then 1 tab po daily for 5 days  . tiZANidine (ZANAFLEX) 4 MG tablet Take 1 tablet (4 mg total) by mouth at bedtime as needed for muscle spasms.  . traMADol (ULTRAM) 50 MG tablet Take 1 tablet (50 mg total) by mouth every 8 (eight) hours as needed for up to 5 days for moderate pain.  . [DISCONTINUED] esomeprazole (NEXIUM) 40 MG capsule TAKE 1 CAPSULE (40 MG TOTAL) BY MOUTH DAILY AT 12 NOON. (Patient taking differently: Take 40 mg by mouth at bedtime. )   No facility-administered encounter medications on file as of 07/15/2019.

## 2019-07-15 NOTE — Telephone Encounter (Signed)
Pt left v/m having back pain and pt thinks having pulled muscle in back. Hurts worse when moves.I spoke with pt;no known injury but pt sneezed several times yesterday and started upper part of back to hurt; hurts all the time but hurts worse when pt moves. Could not sleep last night due to pain.Pt has no covid symptoms, no travel and no known exposure to + covid. Pt said she cannot do a virtual visit and wants to be seen in office. Pt scheduled in office appt today at 12 noon with Dr Lorelei Pont (Dr Einar Pheasant out of office today.) ED precautions given and pt voiced understanding.

## 2019-07-15 NOTE — Telephone Encounter (Signed)
I am always happy to see musculoskeletal issues in our office.  This is my primary role and decreases multiple visits from my partners prior to me seeing the patient.

## 2019-07-18 ENCOUNTER — Other Ambulatory Visit: Payer: Self-pay | Admitting: Gastroenterology

## 2019-08-06 ENCOUNTER — Other Ambulatory Visit: Payer: Self-pay | Admitting: Family Medicine

## 2019-08-06 NOTE — Telephone Encounter (Signed)
Left message for patient to call back to see if she still needs this medication. Still having pain?

## 2019-08-07 NOTE — Telephone Encounter (Signed)
Patient states she does not need this medication. Pain has resolved. This was automatic refill request from pharmacy.

## 2019-08-22 IMAGING — CT CT ANGIO CHEST
2 of 6 series · 18 of 36 positions shown · IV contrast (APPLIED)
Comparison: 05/05/2018 CXR

CLINICAL DATA: Midsternal and right-sided chest pain beginning
yesterday.

EXAM:
CT ANGIOGRAPHY CHEST WITH CONTRAST
TECHNIQUE: Multidetector CT imaging of the chest was performed using the
standard protocol during bolus administration of intravenous
contrast. Multiplanar CT image reconstructions and MIPs were
obtained to evaluate the vascular anatomy.
CONTRAST:  75mL 92ECB4-TQ0 IOPAMIDOL (92ECB4-TQ0) INJECTION 76%

[Series 5: thins · axial · 0.69mm/px · z∈[-376,-106]mm · 17 of 302 slices shown]
[im 16/302  lung]
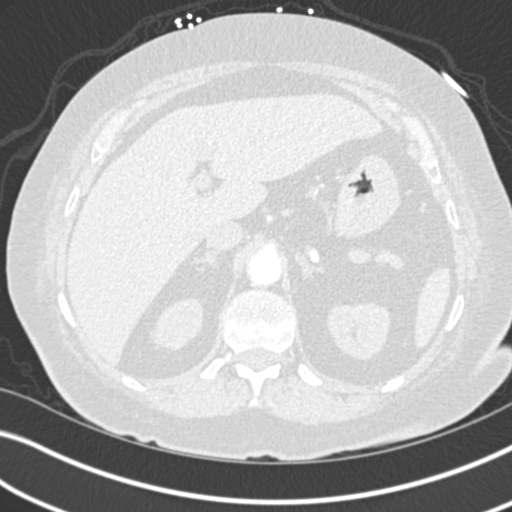
[im 31/302  mediastinal]
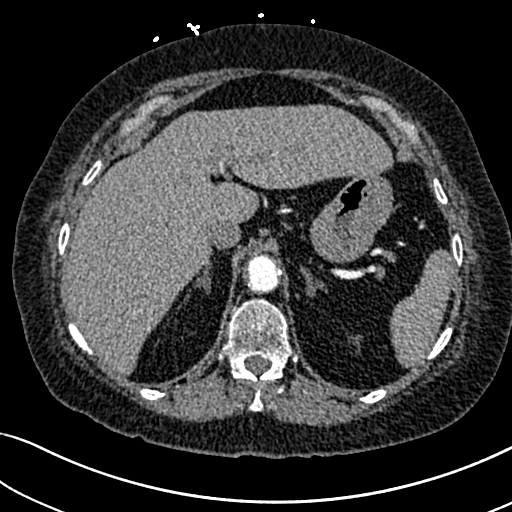
[im 46/302  lung]
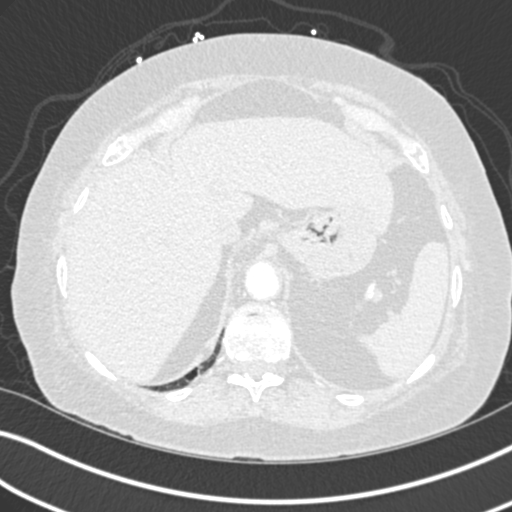
[im 61/302  mediastinal]
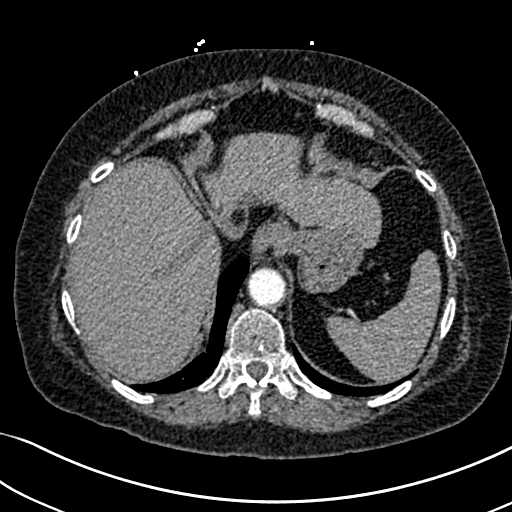
[im 91/302  lung]
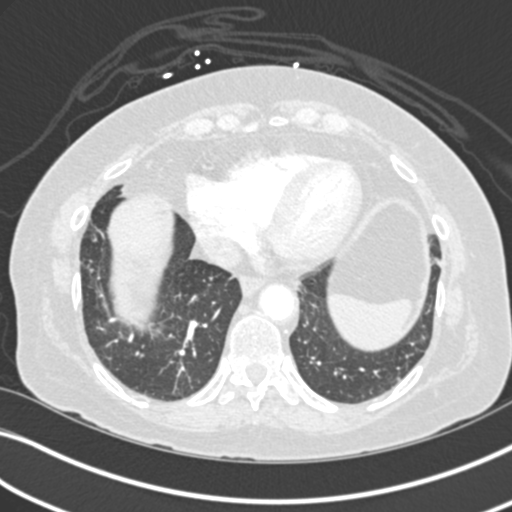
[im 106/302  mediastinal]
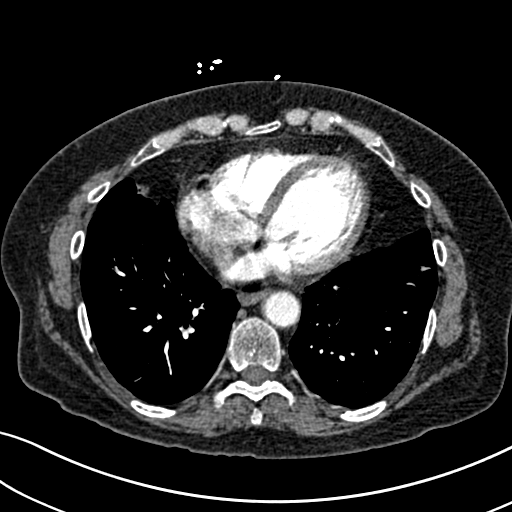
[im 121/302  lung]
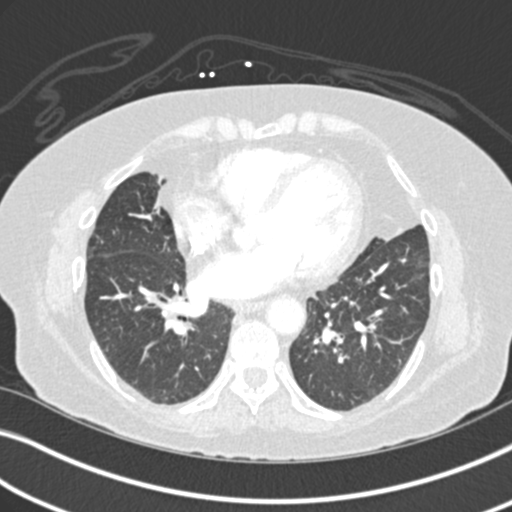
[im 136/302  mediastinal]
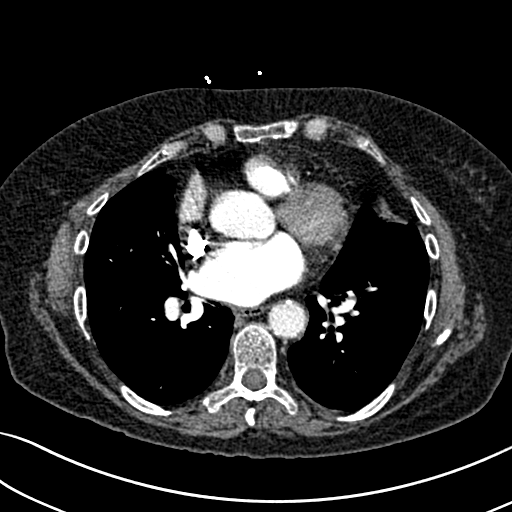
[im 151/302  lung]
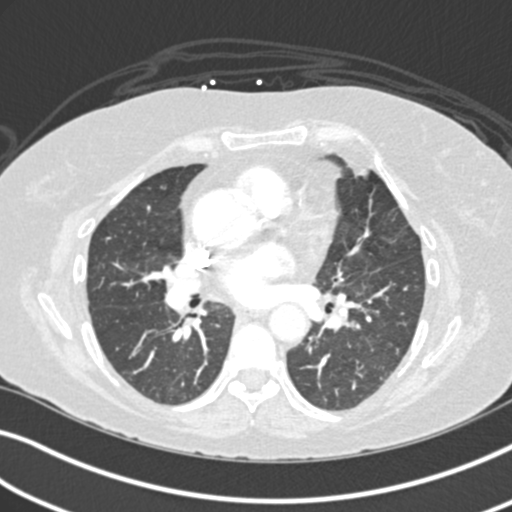
[im 166/302  mediastinal]
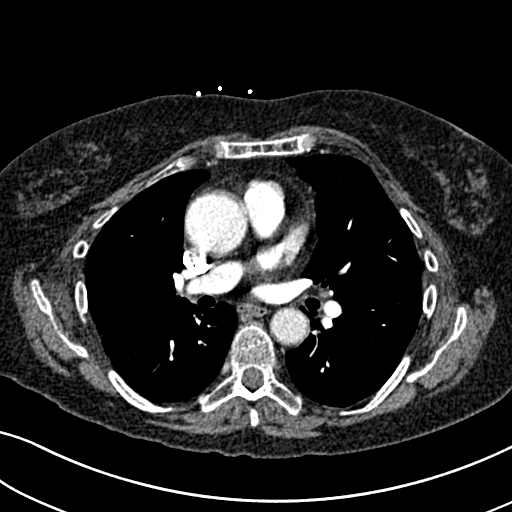
[im 181/302  lung]
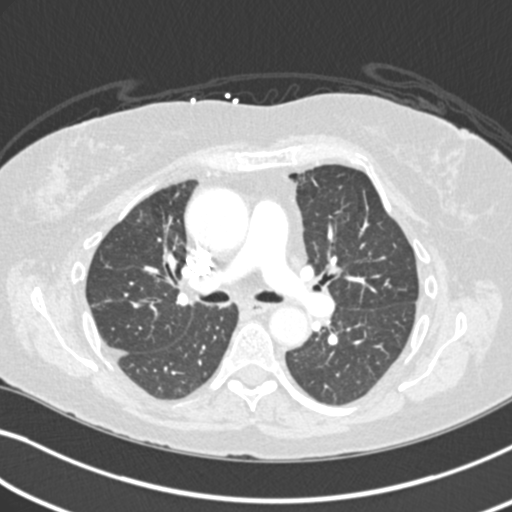
[im 196/302  mediastinal]
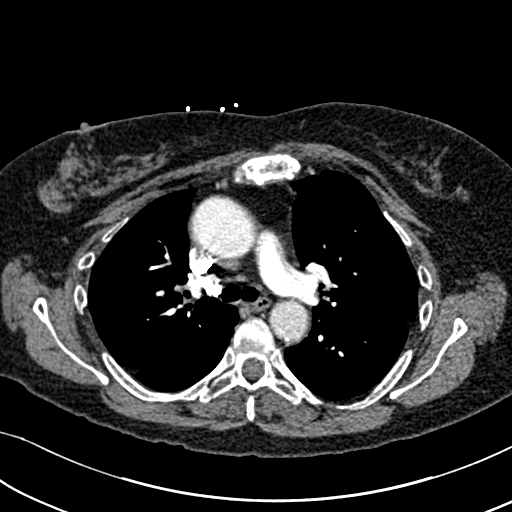
[im 211/302  lung]
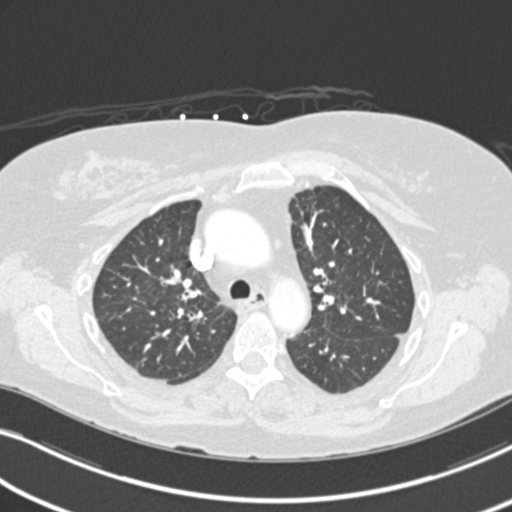
[im 241/302  mediastinal]
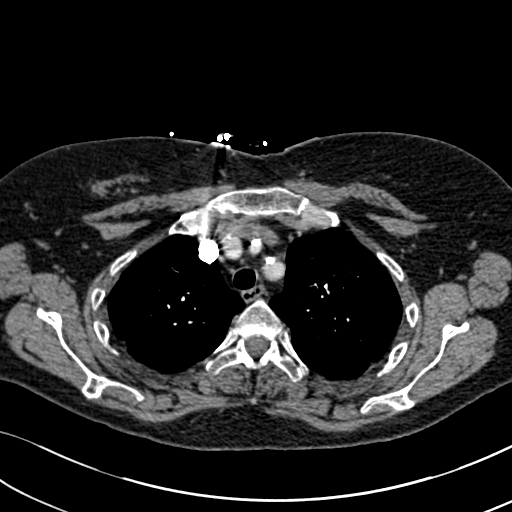
[im 256/302  lung]
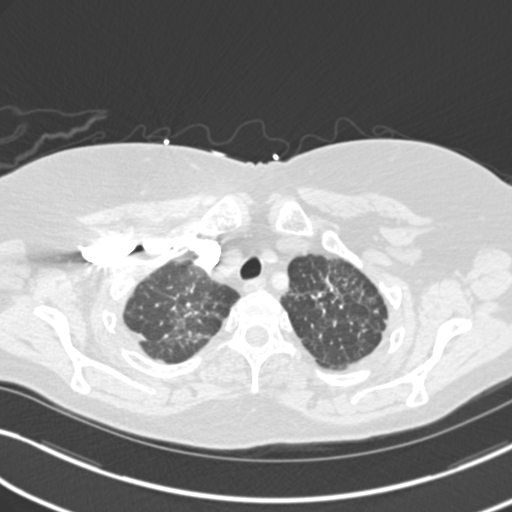
[im 271/302  mediastinal]
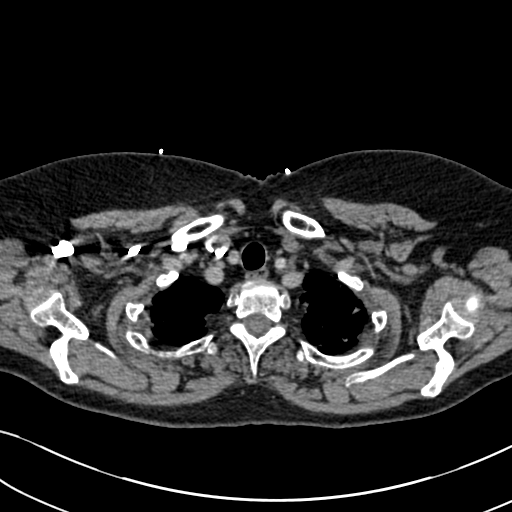
[im 286/302  lung]
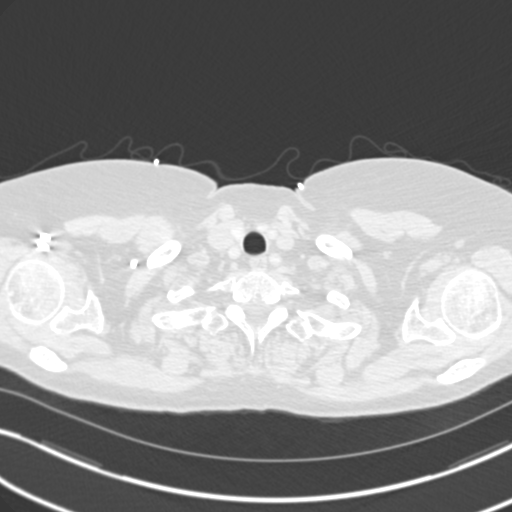

[Series 7: coronal mpr · coronal · 0.59mm/px · 1 of 92 slices shown]
[im 46/92  mediastinal]
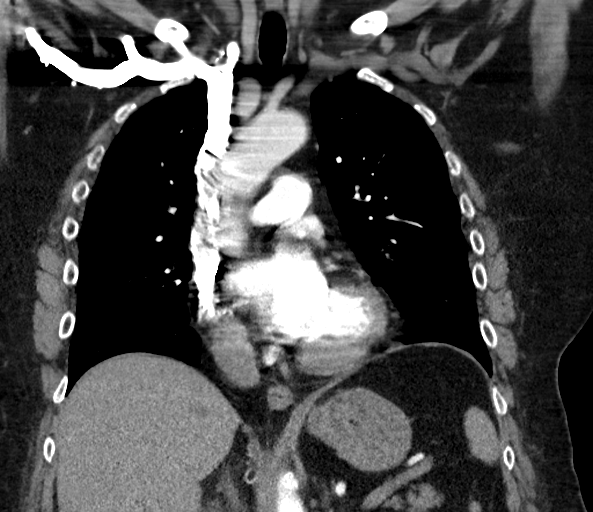

[18 of 36 positions shown; findings below may reference images not displayed]

FINDINGS: Cardiovascular: The study is of quality for the evaluation of
pulmonary embolism. There are no filling defects in the central,
lobar, segmental or subsegmental pulmonary artery branches to
suggest acute pulmonary embolism. Great vessels are normal in course
and caliber with minimal atherosclerosis at the origins.
Conventional branch pattern noted.. Normal heart size. No
significant pericardial fluid/thickening. There is coronary
arteriosclerosis along the left main and LAD. 4.3 cm ascending
thoracic aortic aneurysm without definite dissection. Cardiac motion
artifacts noted near the origin of the sending thoracic aorta
limiting assessment.

Mediastinum/Nodes: No discrete thyroid nodules. Unremarkable
esophagus. No pathologically enlarged axillary, mediastinal or hilar
lymph nodes.

Lungs/Pleura: No pneumothorax. No pleural effusion. Biapical
pleuroparenchymal scarring. 3.7 mm subpleural nodule in the left
upper lobe possibly postinfectious or postinflammatory, series [DATE].
Minimal atelectasis in the right middle lobe and lingula. Subpleural
atelectasis and/or scarring in the left lower lobe laterally and
posterior right lower lobe. No effusion or pneumothorax.

Upper abdomen: Unremarkable.

Musculoskeletal: No aggressive appearing focal osseous lesions.
Remote healed sternal fracture.

Review of the MIP images confirms the above findings.
IMPRESSION: 1. 4.3 cm ascending thoracic aortic aneurysm with atherosclerosis.
Recommend annual imaging followup by CTA or MRA. This recommendation
follows 9393 ACCF/AHA/AATS/ACR/ASA/SCA/VANIELLY/ANA CELINA/INTARKO/SAING Guidelines
for the Diagnosis and Management of Patients with Thoracic Aortic
Disease. Circulation. 9393; 121: [id number]
2. No acute pulmonary embolus.
3. Probable postinfectious or postinflammatory nodules left upper
lobe measuring 3.7 mm in average. No follow-up needed if patient is
low-risk. Non-contrast chest CT can be considered in 12 months if
patient is high-risk. This recommendation follows the consensus
statement: Guidelines for Management of Incidental Pulmonary Nodules
Detected on CT Images: From the [HOSPITAL] 7338; Radiology
7338; [DATE].
4. Coronary arteriosclerosis.

Aortic Atherosclerosis ([id number]).

## 2019-08-22 IMAGING — CR DG CHEST 2V
1 series · 2 of 2 positions shown · non-contrast
Comparison: 01/09/2018

CLINICAL DATA: RIGHT-sided chest pain.

EXAM:
CHEST - 2 VIEW

[Series 1: dg chest 2 view · 0.14mm/px · 2 of 2 slices shown]
[im 1/2]
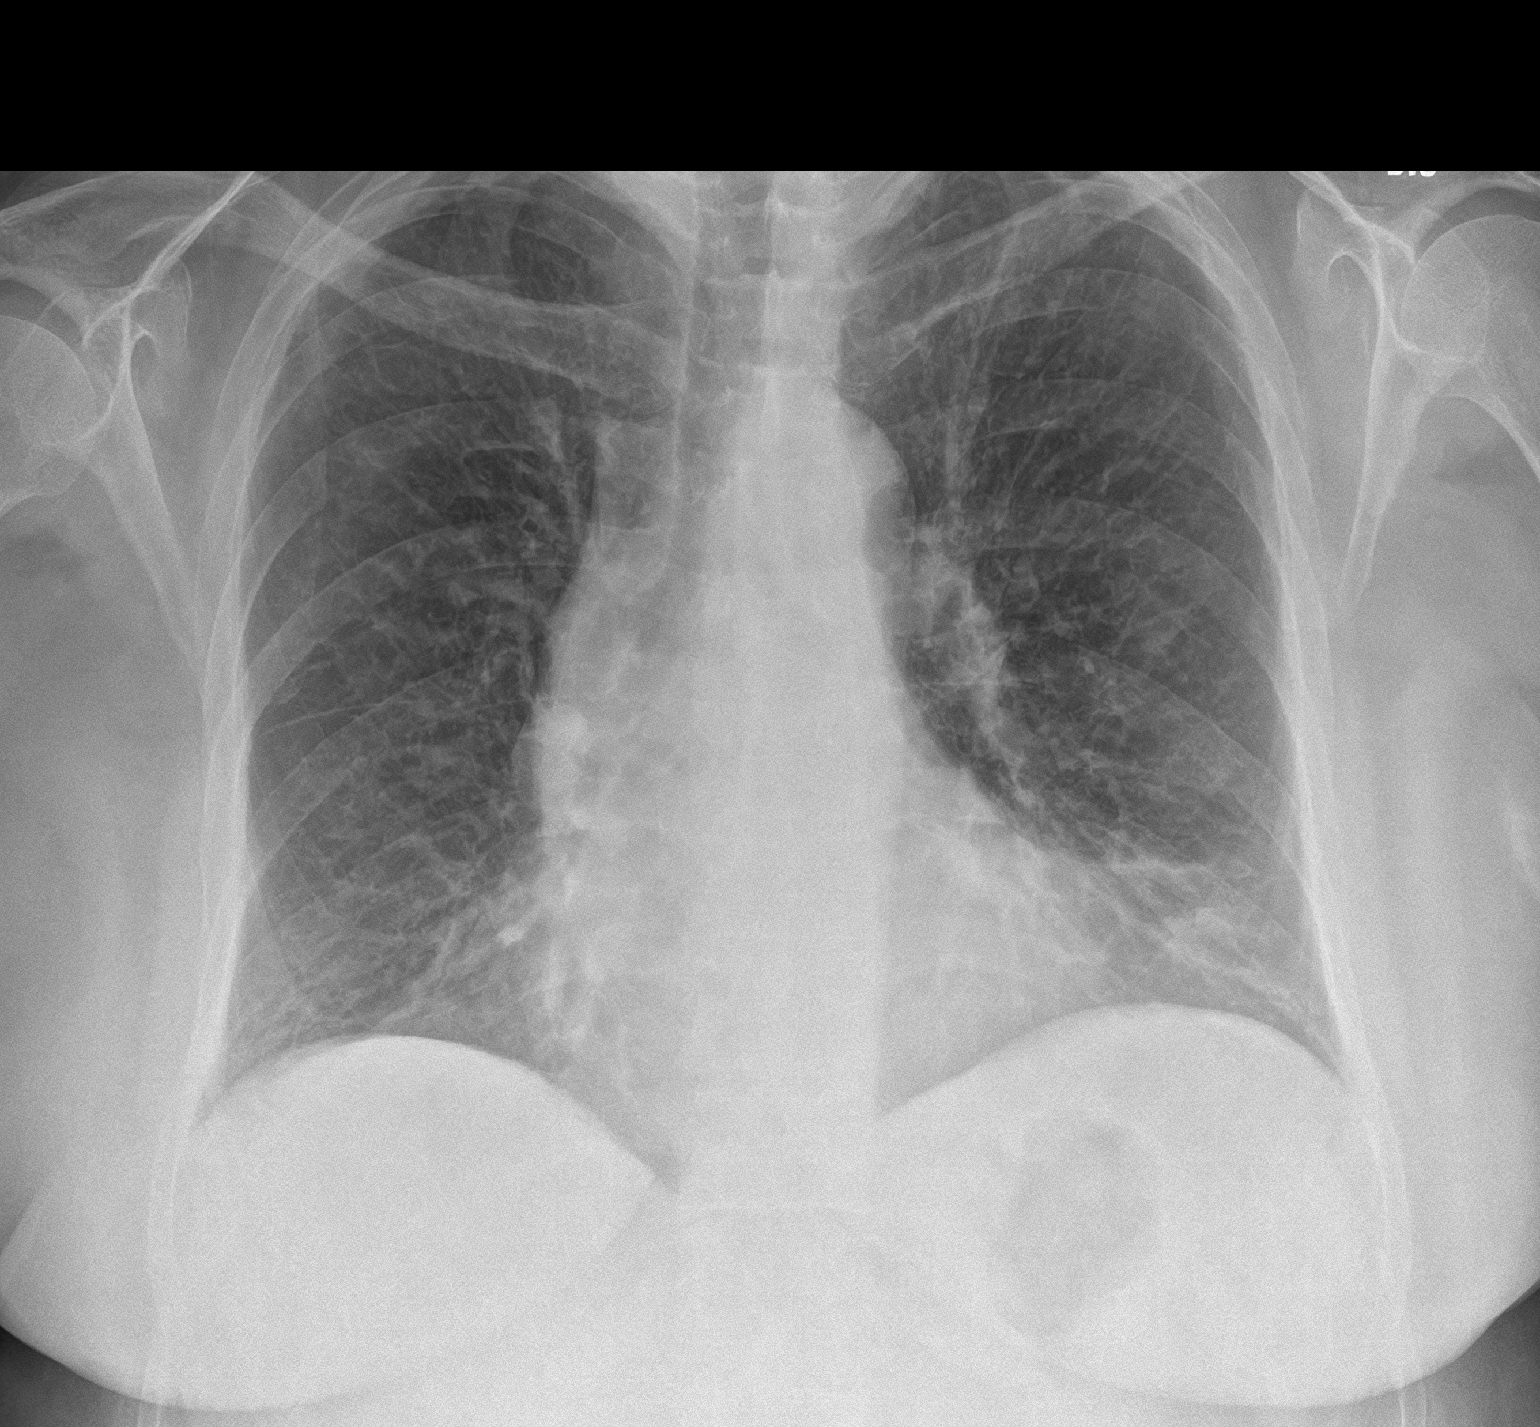
[im 2/2]
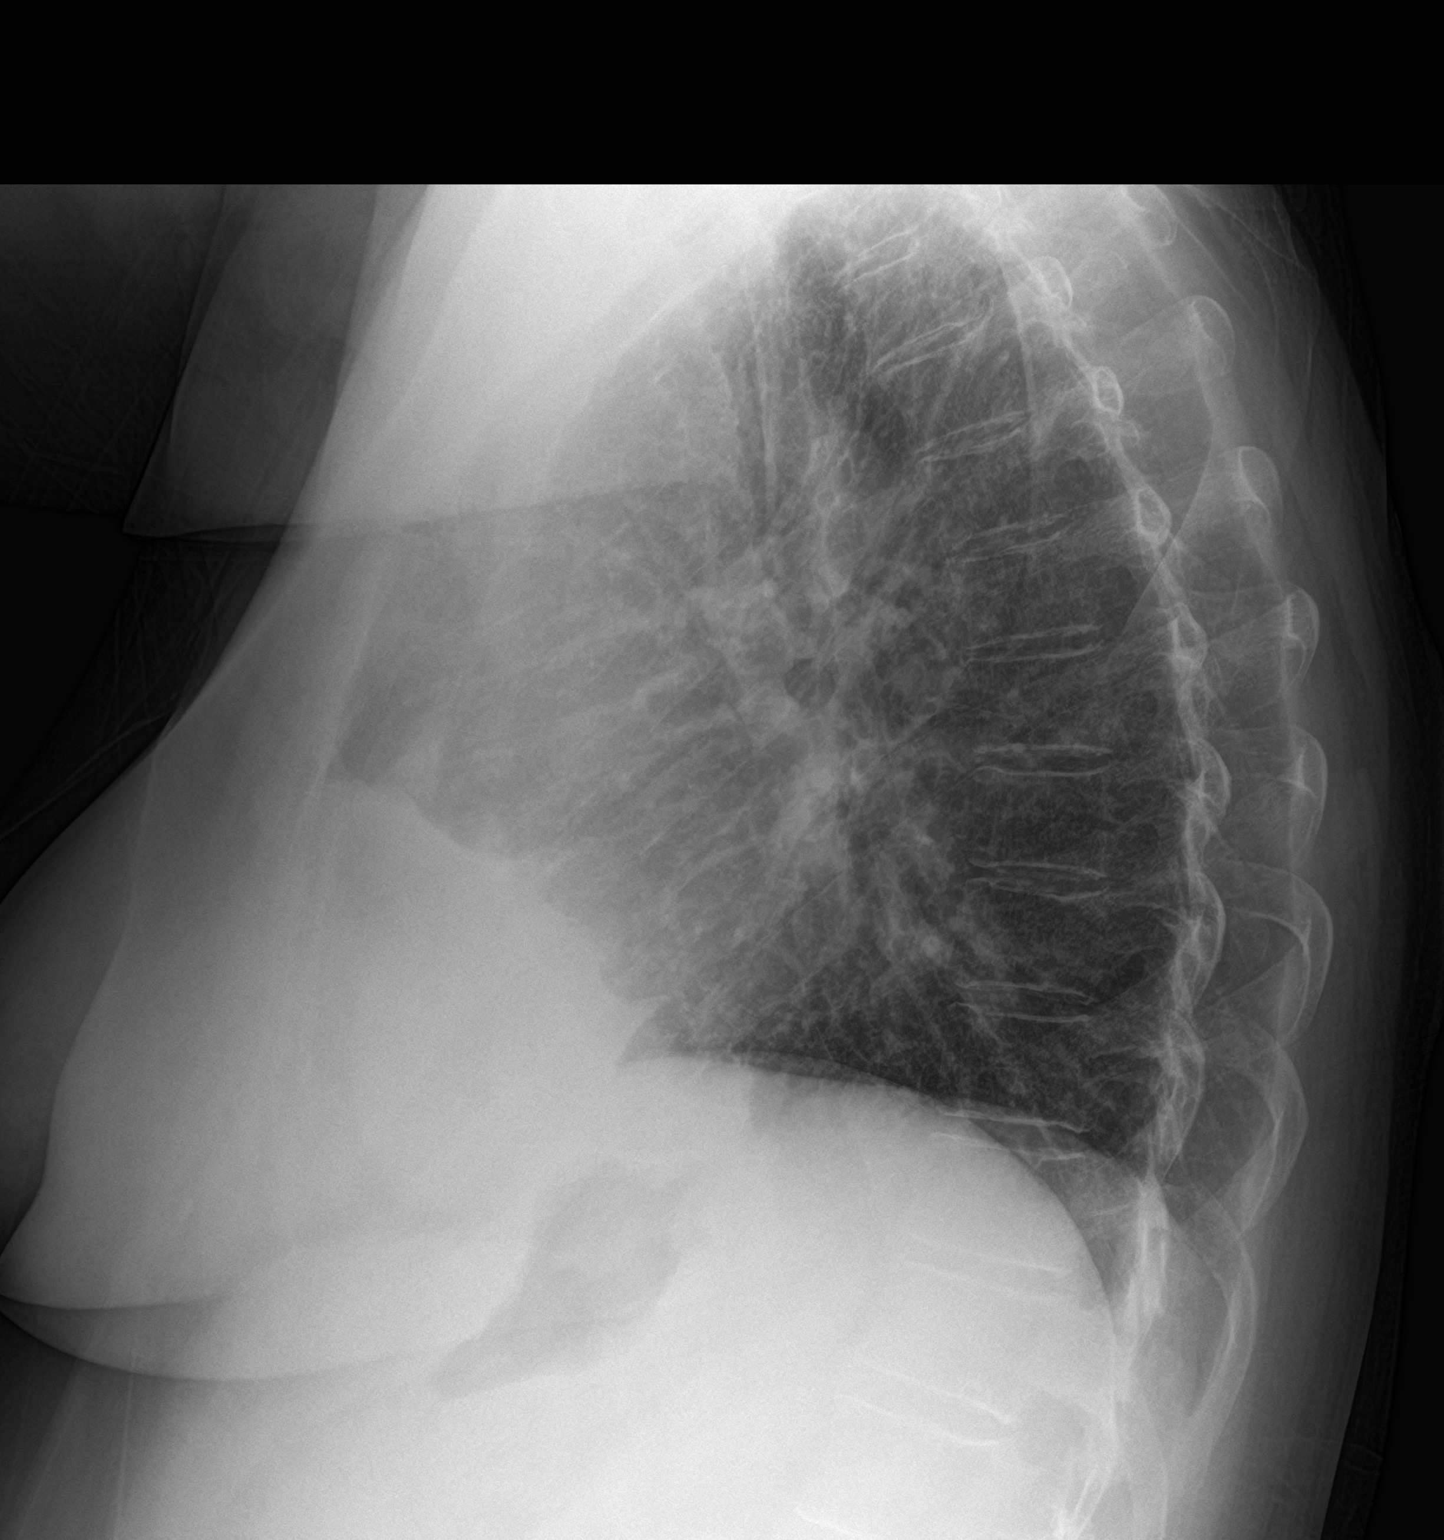

[2 of 2 positions shown; findings below may reference images not displayed]

FINDINGS: Normal cardiac silhouette. There is increased streaky parenchymal
opacity projecting over the LEFT heart border. Overall mild
interstitial linear pattern is increased from comparison exam. There
is biapical pleural capping which is similar.
IMPRESSION: 1. RIGHT lingular atelectasis versus infiltrate
2. New mild interstitial edema pattern.

## 2019-09-14 ENCOUNTER — Telehealth: Payer: Self-pay

## 2019-09-14 NOTE — Telephone Encounter (Signed)
Aware and agree with that advisement

## 2019-09-14 NOTE — Telephone Encounter (Signed)
Pt said 09/11/19 began with black stringy diarrhea and last diarrhea was 09/13/19 at 11 PM and BM was black,watery and stringy. Today pts stomach is crampy pain today below the waist at belly button.pain level now is 4. No UTI symptoms and pt has not been taking pepto bismol. Pt has hx of ulcers and symptoms where the same. Pt has SOB for 2 years due to COPD.Pt has no covid symptoms except diarrhea and SOB;  no travel and no known exposure to + covid. Pt will go to UC (pt is not sure which UC she will go to but will decide and go to UC now.FYI to Dr Glori Bickers since Dr Einar Pheasant is out of office.

## 2019-10-20 ENCOUNTER — Telehealth: Payer: Self-pay | Admitting: *Deleted

## 2019-10-20 ENCOUNTER — Other Ambulatory Visit: Payer: Self-pay

## 2019-10-20 ENCOUNTER — Emergency Department: Payer: Medicare HMO

## 2019-10-20 ENCOUNTER — Emergency Department
Admission: EM | Admit: 2019-10-20 | Discharge: 2019-10-20 | Disposition: A | Discharge status: Home / Self Care | Payer: Medicare HMO | Attending: Emergency Medicine | Admitting: Emergency Medicine

## 2019-10-20 DIAGNOSIS — R079 Chest pain, unspecified: Secondary | ICD-10-CM | POA: Insufficient documentation

## 2019-10-20 DIAGNOSIS — R42 Dizziness and giddiness: Secondary | ICD-10-CM | POA: Diagnosis not present

## 2019-10-20 DIAGNOSIS — Z5321 Procedure and treatment not carried out due to patient leaving prior to being seen by health care provider: Secondary | ICD-10-CM | POA: Diagnosis not present

## 2019-10-20 DIAGNOSIS — R200 Anesthesia of skin: Secondary | ICD-10-CM | POA: Insufficient documentation

## 2019-10-20 DIAGNOSIS — Z8673 Personal history of transient ischemic attack (TIA), and cerebral infarction without residual deficits: Secondary | ICD-10-CM | POA: Insufficient documentation

## 2019-10-20 LAB — COMPREHENSIVE METABOLIC PANEL
ALT: 10 U/L (ref 0–44)
AST: 14 U/L — ABNORMAL LOW (ref 15–41)
Albumin: 3.3 g/dL — ABNORMAL LOW (ref 3.5–5.0)
Alkaline Phosphatase: 141 U/L — ABNORMAL HIGH (ref 38–126)
Anion gap: 12 (ref 5–15)
BUN: 20 mg/dL (ref 8–23)
CO2: 29 mmol/L (ref 22–32)
Calcium: 8.9 mg/dL (ref 8.9–10.3)
Chloride: 101 mmol/L (ref 98–111)
Creatinine, Ser: 1.07 mg/dL — ABNORMAL HIGH (ref 0.44–1.00)
GFR calc Af Amer: >60 mL/min (ref >60)
GFR calc non Af Amer: 54 mL/min — ABNORMAL LOW (ref >60)
Glucose, Bld: 102 mg/dL — ABNORMAL HIGH (ref 70–99)
Potassium: 3.5 mmol/L (ref 3.5–5.1)
Sodium: 142 mmol/L (ref 135–145)
Total Bilirubin: 0.4 mg/dL (ref 0.3–1.2)
Total Protein: 6.9 g/dL (ref 6.5–8.1)

## 2019-10-20 LAB — DIFFERENTIAL
Abs Immature Granulocytes: 0.05 10*3/uL (ref 0.00–0.07)
Basophils Absolute: 0 10*3/uL (ref 0.0–0.1)
Basophils Relative: 0 %
Eosinophils Absolute: 0.2 10*3/uL (ref 0.0–0.5)
Eosinophils Relative: 2 %
Immature Granulocytes: 1 %
Lymphocytes Relative: 28 %
Lymphs Abs: 2.6 10*3/uL (ref 0.7–4.0)
Monocytes Absolute: 0.5 10*3/uL (ref 0.1–1.0)
Monocytes Relative: 5 %
Neutro Abs: 5.9 10*3/uL (ref 1.7–7.7)
Neutrophils Relative %: 64 %

## 2019-10-20 LAB — CBC
HCT: 33.1 % — ABNORMAL LOW (ref 36.0–46.0)
Hemoglobin: 9.9 g/dL — ABNORMAL LOW (ref 12.0–15.0)
MCH: 24.8 pg — ABNORMAL LOW (ref 26.0–34.0)
MCHC: 29.9 g/dL — ABNORMAL LOW (ref 30.0–36.0)
MCV: 82.8 fL (ref 80.0–100.0)
Platelets: 325 10*3/uL (ref 150–400)
RBC: 4 MIL/uL (ref 3.87–5.11)
RDW: 15.7 % — ABNORMAL HIGH (ref 11.5–15.5)
WBC: 9.3 10*3/uL (ref 4.0–10.5)
nRBC: 0 % (ref 0.0–0.2)

## 2019-10-20 LAB — PROTIME-INR
INR: 1 (ref 0.8–1.2)
Prothrombin Time: 12.7 seconds (ref 11.4–15.2)

## 2019-10-20 LAB — APTT: aPTT: 30 seconds (ref 24–36)

## 2019-10-20 MED ORDER — SODIUM CHLORIDE 0.9% FLUSH: 3.0000 mL | Freq: Once | INTRAVENOUS | Status: DC

## 2019-10-20 NOTE — Telephone Encounter (Signed)
Patient called stating that she has had left arm numbness that started Saturday and has a history of stroke.  Patient also stated that she started with dizzy spells and her SOB has been worse since Saturday. Patient  stated that she had diarrhea the first of the week.  After speaking with Dr. Einar Pheasant patient was advised that she needs to go to the ER to be evaluated and she verbalized understanding. Patient stated that she will go to [facility] to be evaluated.

## 2019-10-20 NOTE — Telephone Encounter (Signed)
Agree with plan for ER

## 2019-10-20 NOTE — ED Triage Notes (Signed)
Pt c/o left arm numbness and dizziness with movement and chest pain since Saturday, pt has a hx of stroke in the past.

## 2019-10-21 NOTE — Telephone Encounter (Signed)
Patient advised and verbalized understanding 

## 2019-10-21 NOTE — Telephone Encounter (Signed)
Pt left v/m that she did go to [facility] ED and waited 4 hrs; was getting dark and pt cannot drive after dark and pt left ED without being seen. Pt did have lab testing,EKG and CT of head. Pt ask that Dr Einar Pheasant review results of all testing done at ED and call pt with results and advice of what to do.

## 2019-10-21 NOTE — Telephone Encounter (Signed)
Spoke with patient and advised of everything.  1) patient checked her b/p firs time since leaving ER now while on the phone with me and it was 148/83 and pulse 84.  2) patient states her breathing difficulty is chronic and not different from her usual. Diarrhea has been present for several weeks. No fever. Patient was advised of recommendation of been tested for COVID but patient said she honestly does not think she has this, does not have symptoms of it (advised patient diarrhea and breathing issues are possible symptoms of COVID) and has not been out and around people and declines getting tested at this time.  Advised patient I would call her back with any other recommendations from Dr Einar Pheasant.

## 2019-10-21 NOTE — Telephone Encounter (Signed)
Please let patient know that her head CT scan was normal.   Her labs overall look OK. She still has an anemia.   Her EKG was also similar to prior.   Follow-up questions:   1) What is her blood pressure at home?  --- It was very high in the ER  2) Is she still having diarrhea and breathing difficulty?  --- If so, I would recommend we have her tested for COVID-19. They did not do this in the ER

## 2019-10-21 NOTE — Telephone Encounter (Signed)
Would recommend appointment if symptoms persist. And encourage hydration.

## 2019-10-22 ENCOUNTER — Telehealth: Payer: Self-pay | Admitting: Emergency Medicine

## 2019-10-22 NOTE — Telephone Encounter (Signed)
Called patient due to lwot to inquire about condition and follow up plans. She says she has contacted her pcp .

## 2019-12-09 IMAGING — CR DG CHEST 2V
2 series · 2 of 2 positions shown · non-contrast
Comparison: Chest x-rays dated 05/05/2018 and 11/28/2017. Chest CT
dated 05/05/2018.

CLINICAL DATA: LEFT side chest pain last night with nausea and
vomiting. Shortness of breath.

EXAM:
CHEST - 2 VIEW

[chest pa]
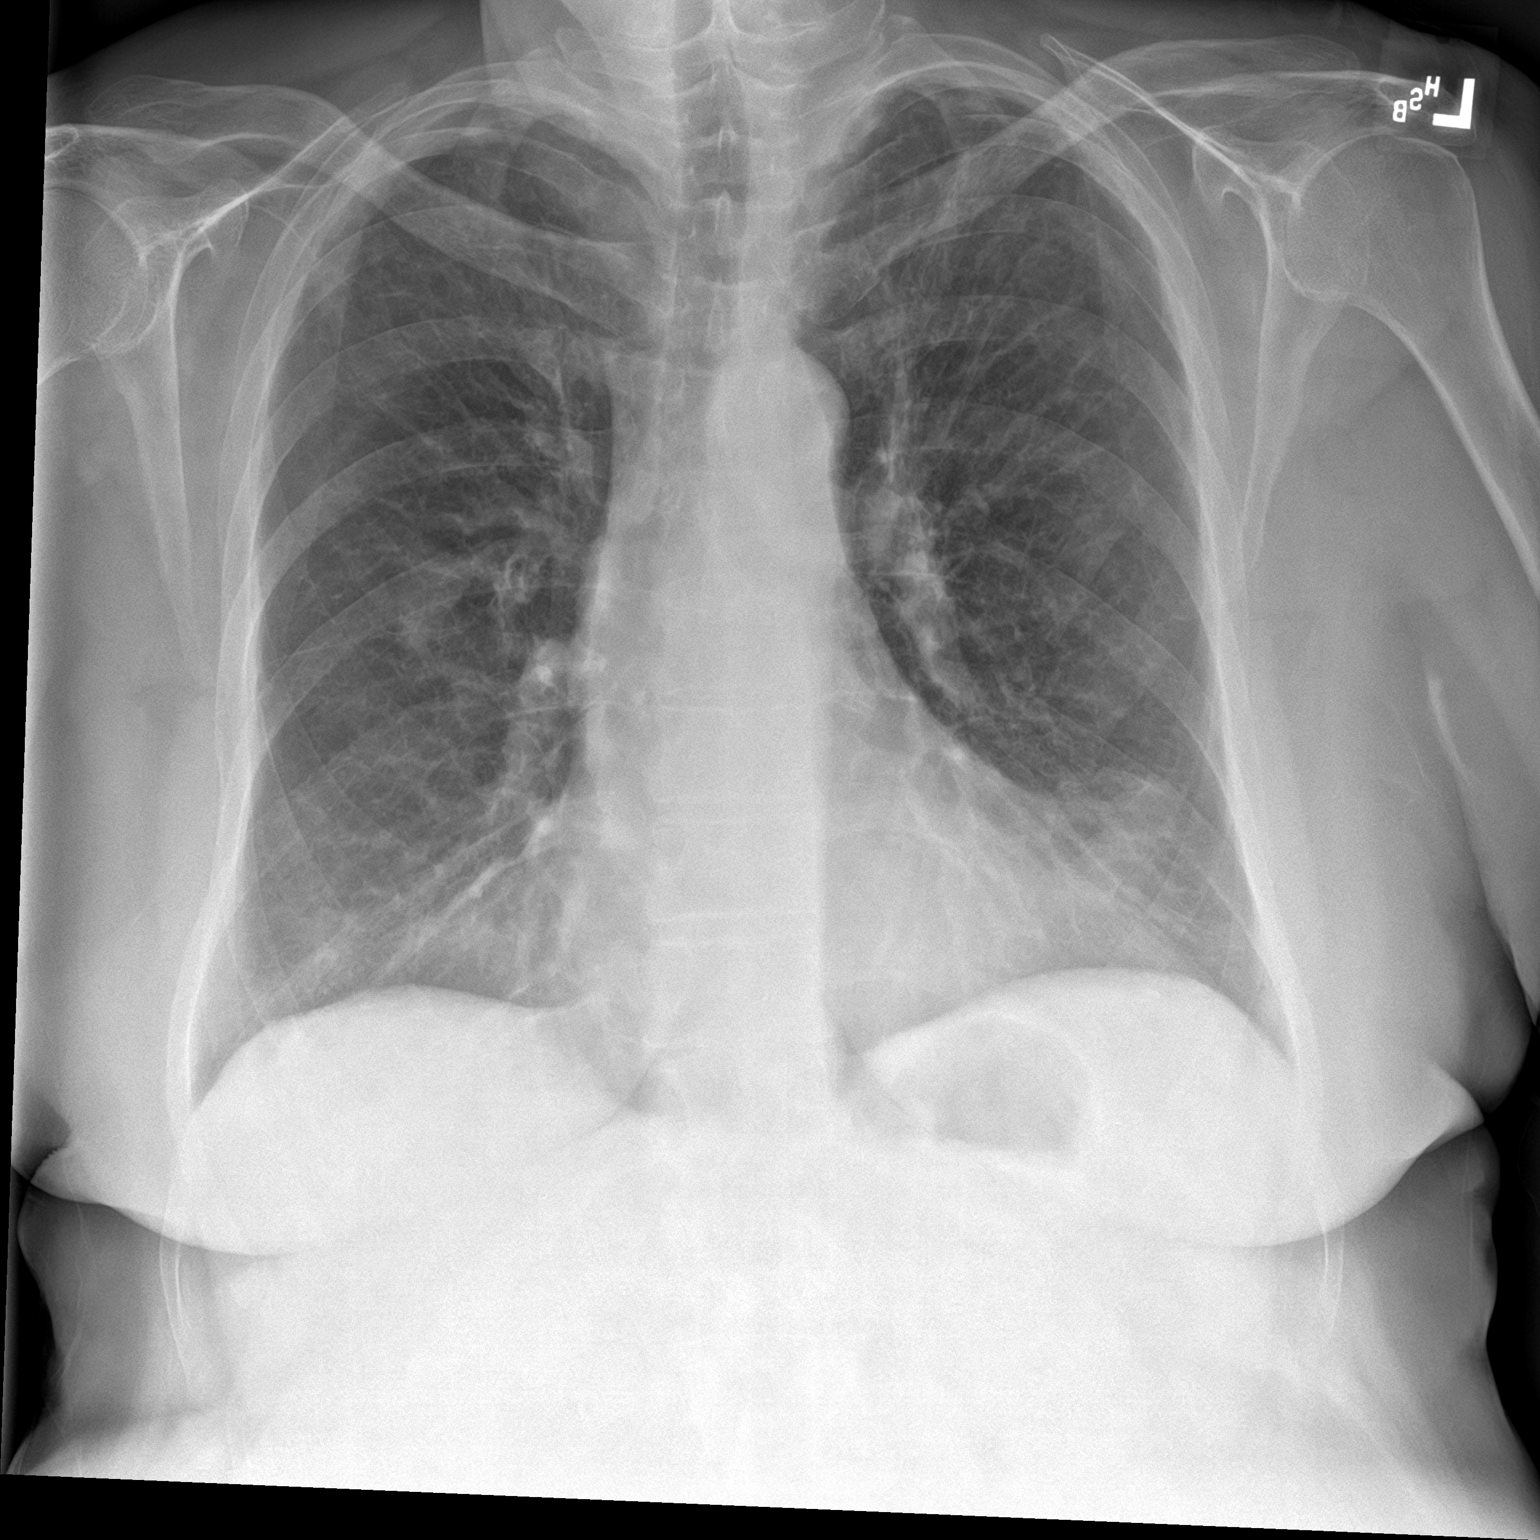

[chest lat]
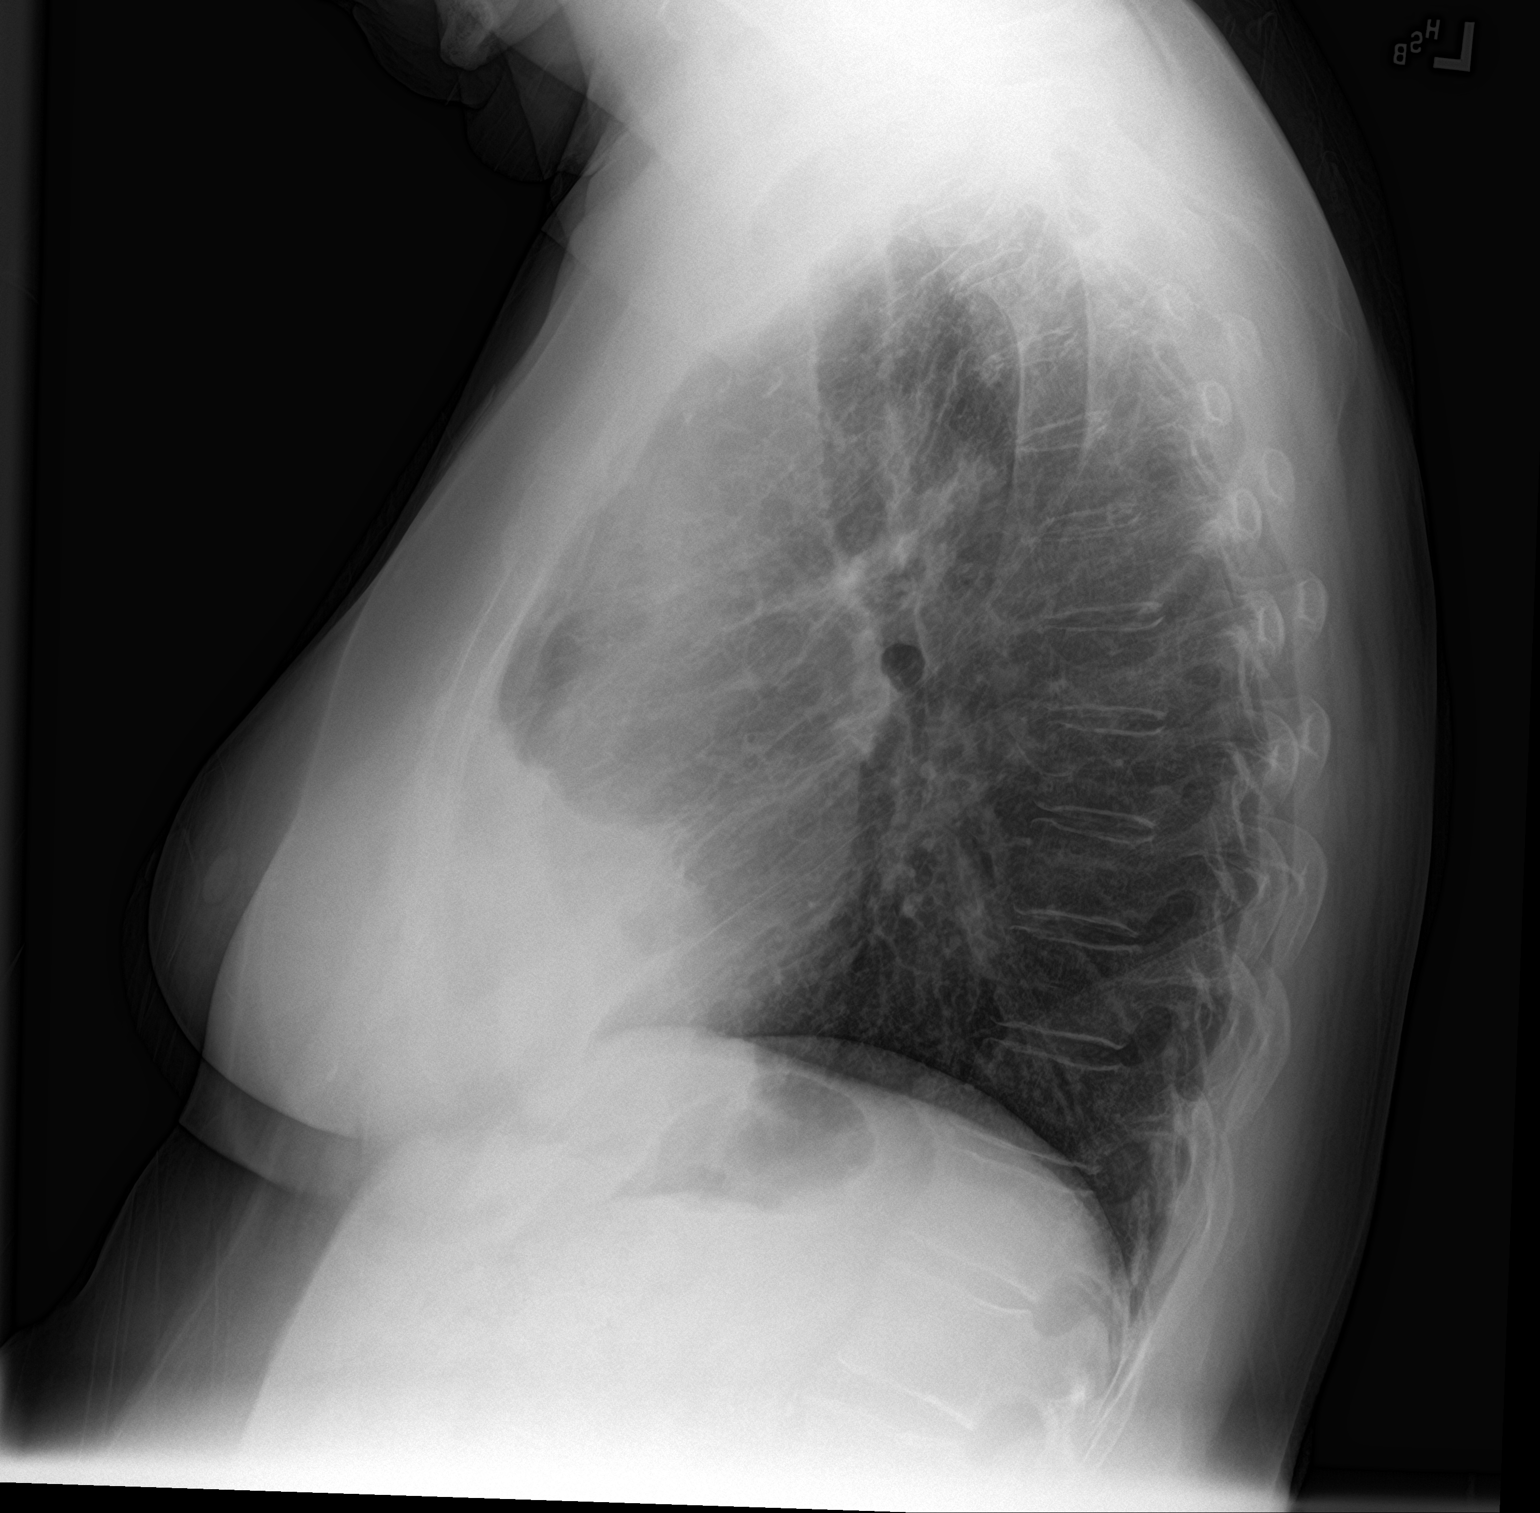

[2 of 2 positions shown; findings below may reference images not displayed]

FINDINGS: Heart size and mediastinal contours are stable. Prominent epicardial
fat pad, as demonstrated on recent chest CT. Chronic bronchitic
changes. Additional coarse lung markings bilaterally, upper lobe
predominant, suggesting some degree of chronic interstitial lung
disease/fibrosis.

No new lung abnormality. No confluent opacity to suggest a
developing pneumonia. No pleural effusion or pneumothorax seen. No
acute or suspicious osseous finding.
IMPRESSION: 1. No active cardiopulmonary disease. No evidence of pneumonia or
pulmonary edema.
2. Chronic bronchitic changes and probable chronic interstitial lung
disease/fibrosis.

## 2019-12-09 IMAGING — CT CT ANGIO CHEST
2 of 6 series · 18 of 46 positions shown · IV contrast (APPLIED)
Comparison: CT chest 05/05/2017 and chest radiograph from
08/22/2018

CLINICAL DATA: Sudden onset left-sided numbness and left chest
pain.

EXAM:
CT ANGIOGRAPHY CHEST WITH CONTRAST
TECHNIQUE: Multidetector CT imaging of the chest was performed using the
standard protocol during bolus administration of intravenous
contrast. Multiplanar CT image reconstructions and MIPs were
obtained to evaluate the vascular anatomy.
CONTRAST:  60mL OMNIPAQUE IOHEXOL 350 MG/ML SOLN

[Series 5: thins · axial · 0.62mm/px · z∈[-545,-276]mm · 15 of 295 slices shown]
[im 13/295  lung]
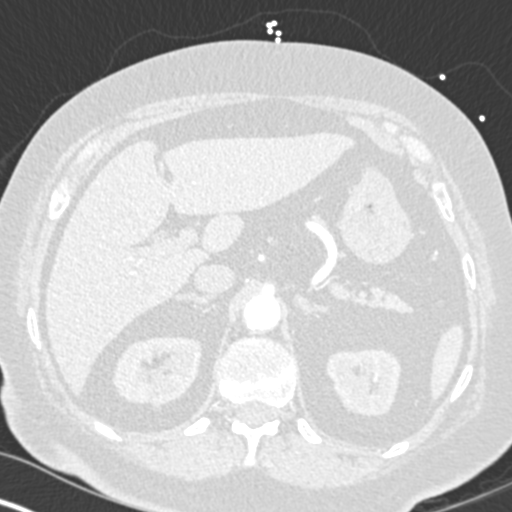
[im 39/295  soft-tissue]
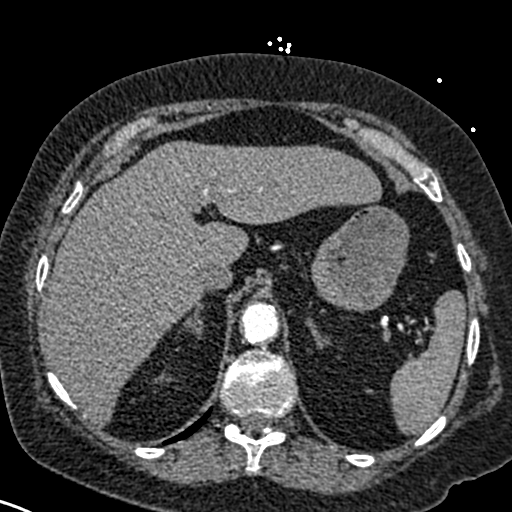
[im 52/295  lung]
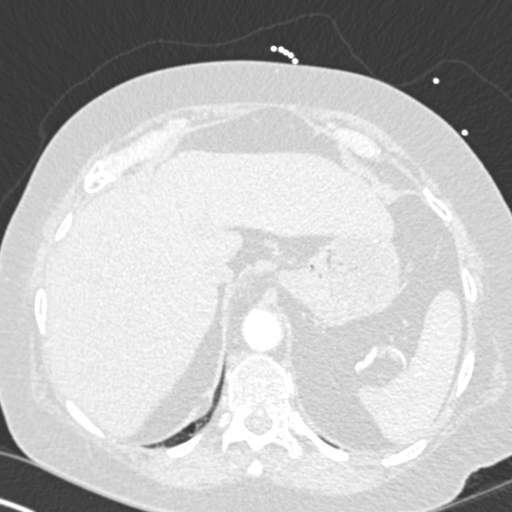
[im 77/295  soft-tissue]
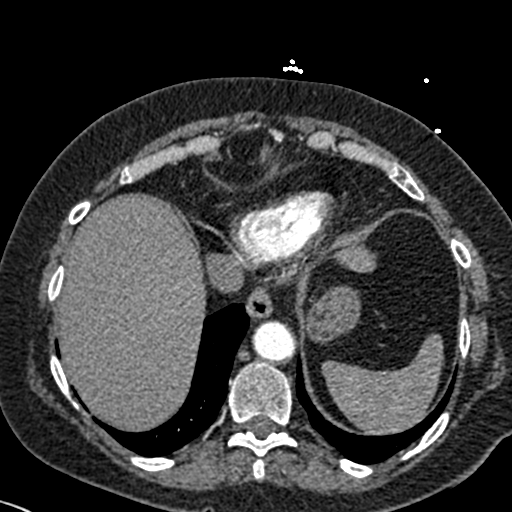
[im 90/295  lung]
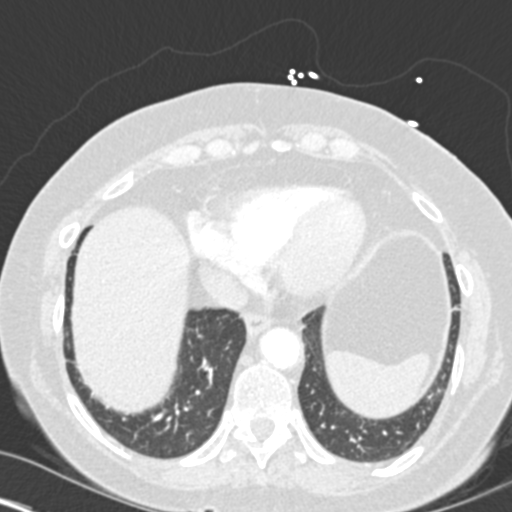
[im 116/295  soft-tissue]
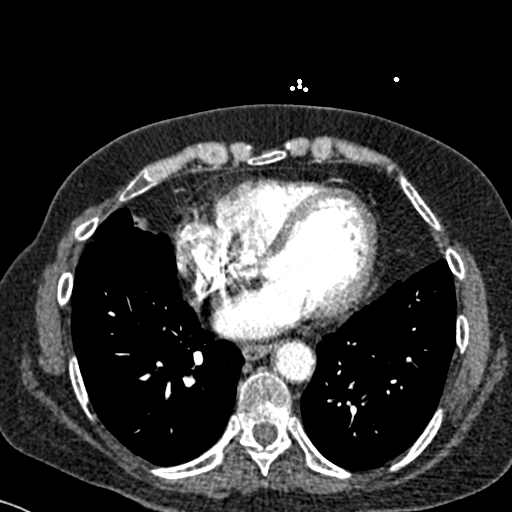
[im 128/295  lung]
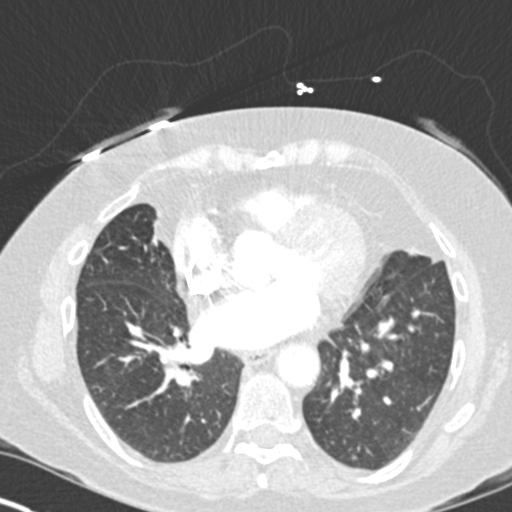
[im 154/295  soft-tissue]
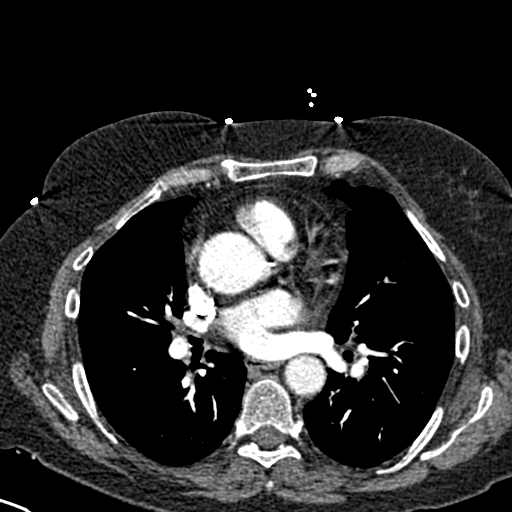
[im 167/295  lung]
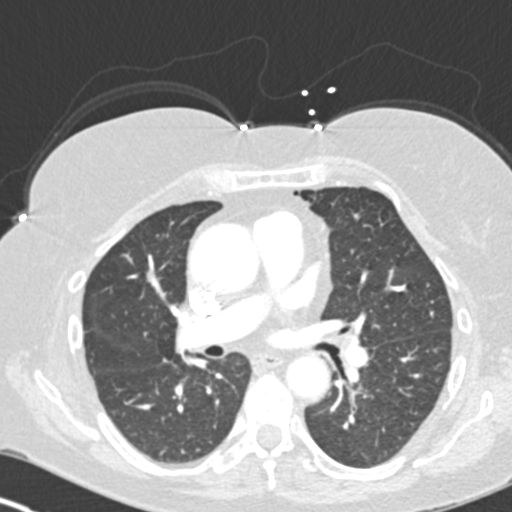
[im 179/295  soft-tissue]
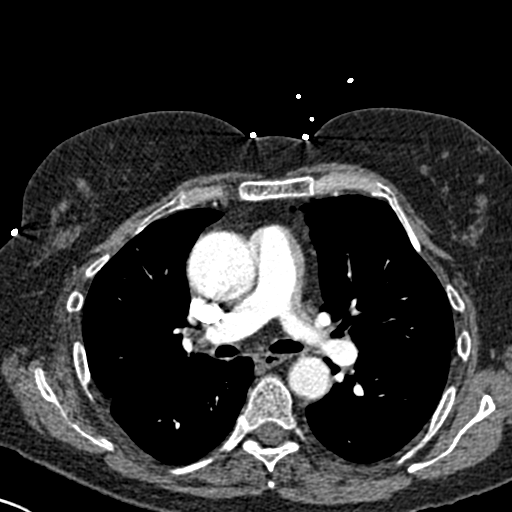
[im 205/295  lung]
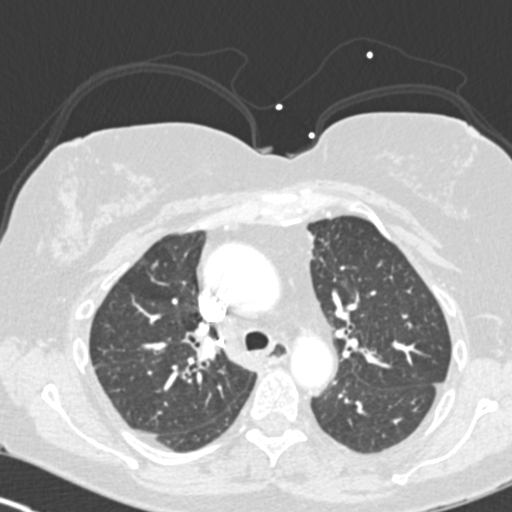
[im 218/295  soft-tissue]
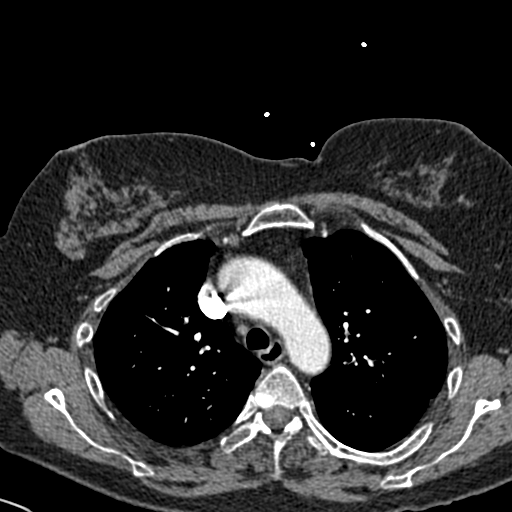
[im 243/295  lung]
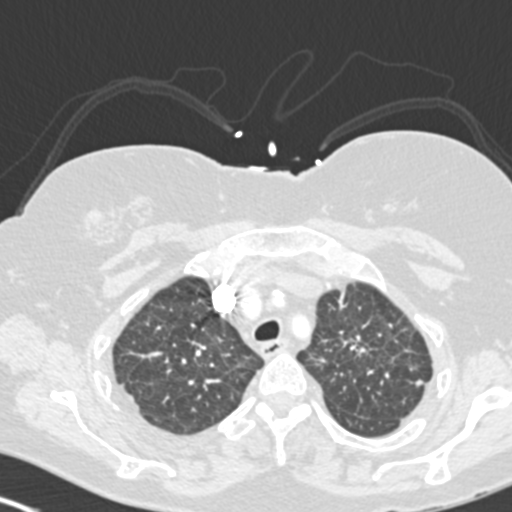
[im 256/295  soft-tissue]
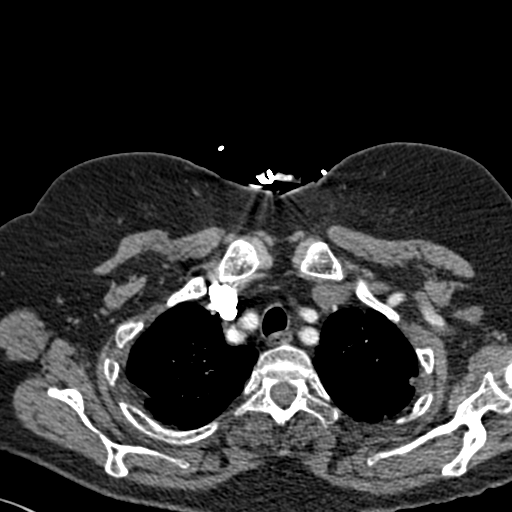
[im 282/295  lung]
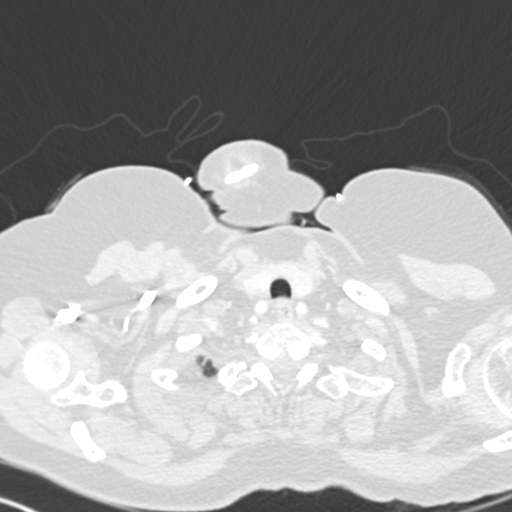

[Series 7: coronal mpr · coronal · 0.61mm/px · 3 of 89 slices shown]
[im 23/89  soft-tissue]
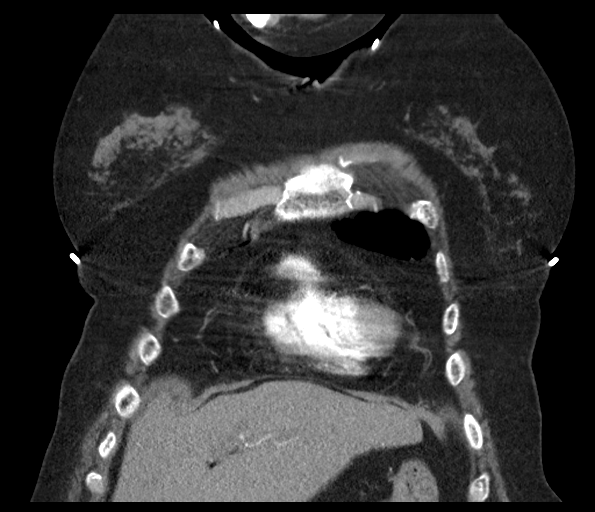
[im 45/89  soft-tissue]
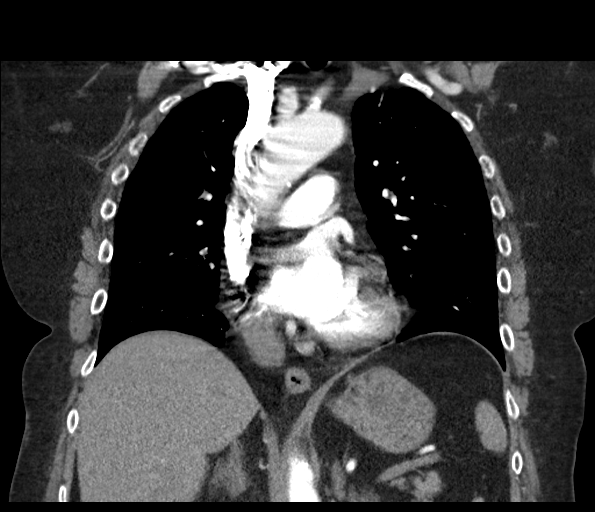
[im 67/89  soft-tissue]
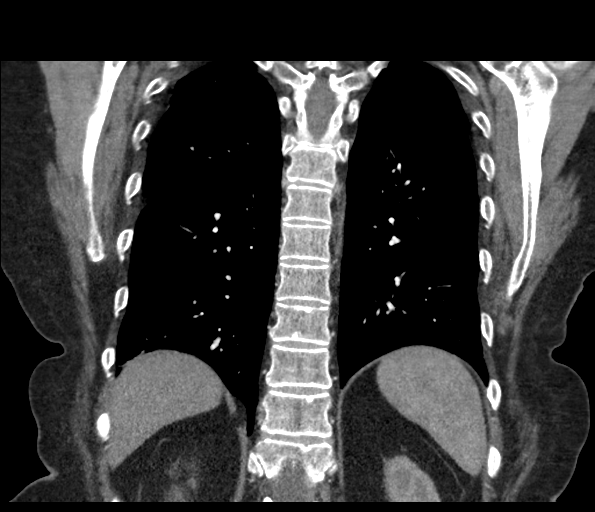

[18 of 46 positions shown; findings below may reference images not displayed]

FINDINGS: Cardiovascular: No filling defect is identified in the pulmonary
arterial tree to suggest pulmonary embolus. Coronary, aortic arch,
and branch vessel atherosclerotic vascular disease. Ascending
thoracic aortic aneurysm 4.3 cm in diameter, stable, without
dissection or acute vascular abnormality. Borderline cardiomegaly.

Mediastinum/Nodes: Slightly asymmetric glandular tissues in the
breasts, greater in the right upper breast, but without a discrete
focal lesion observed.

Small supraclavicular and mediastinal lymph nodes are not
pathologically enlarged.

Lungs/Pleura: Biapical pleuroparenchymal scarring. Mild ground-glass
opacity and bandlike density anteriorly in the right upper lobe for
example on image 35/6, new compared to the prior exam of 05/05/2018.
Mild airway thickening. Mild scarring anteromedially in the left
upper lobe, unchanged, with associated volume loss. There is also
some scarring inferiorly in the lingula, likewise stable. A dense
calcified granuloma in the left lower lobe adjacent to the diaphragm
is stable on image 67/6. No pleural effusion.

Upper Abdomen: Unremarkable

Musculoskeletal: Old healed left rib fractures. Old healed upper
sternal body fracture.

Review of the MIP images confirms the above findings.
IMPRESSION: 1. No filling defect is identified in the pulmonary arterial tree to
suggest pulmonary embolus.
2. Stable appearance of 4.3 cm ascending aortic aneurysm. Recommend
annual imaging followup by CTA or MRA. This recommendation follows
8656 ACCF/AHA/AATS/ACR/ASA/SCA/TIGER/TADASHI FUTIGAMI/MISAEL/NERA Guidelines for the
Diagnosis and Management of Patients with Thoracic Aortic Disease.
Circulation. 8656; 121: [id number]
3. Mild airway thickening with some ground-glass and bandlike
opacities anteriorly in the right upper lobe which are new from
05/05/2018, and may represent a small region of atypical infection.
4. Aortic Atherosclerosis ([id number]-[id number].E). Coronary atherosclerosis
with borderline cardiomegaly.

## 2019-12-10 IMAGING — US US CAROTID DUPLEX BILAT
1 series · 13 of 24 positions shown · non-contrast
Comparison: 10/05/2017

CLINICAL DATA: Stroke.

EXAM:
BILATERAL CAROTID DUPLEX ULTRASOUND
TECHNIQUE: Gray scale imaging, color Doppler and duplex ultrasound were
performed of bilateral carotid and vertebral arteries in the neck.

[Series 1: us carotid duplex bilat · 13 of 66 slices shown]
[im 1/66]
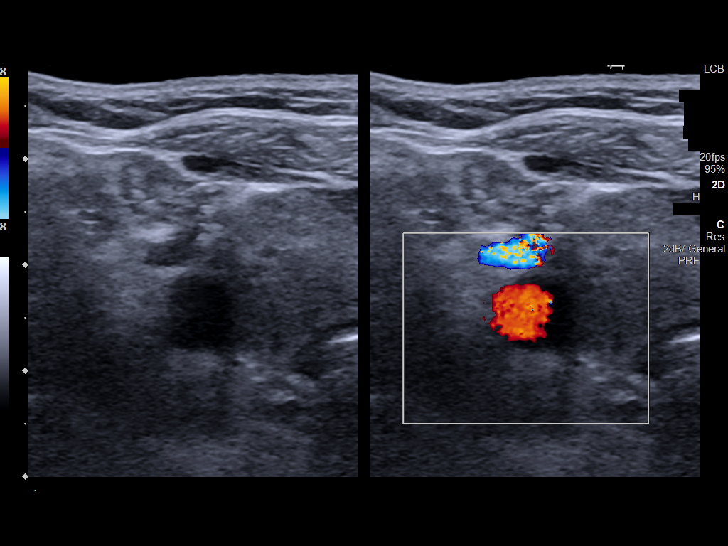
[im 6/66]
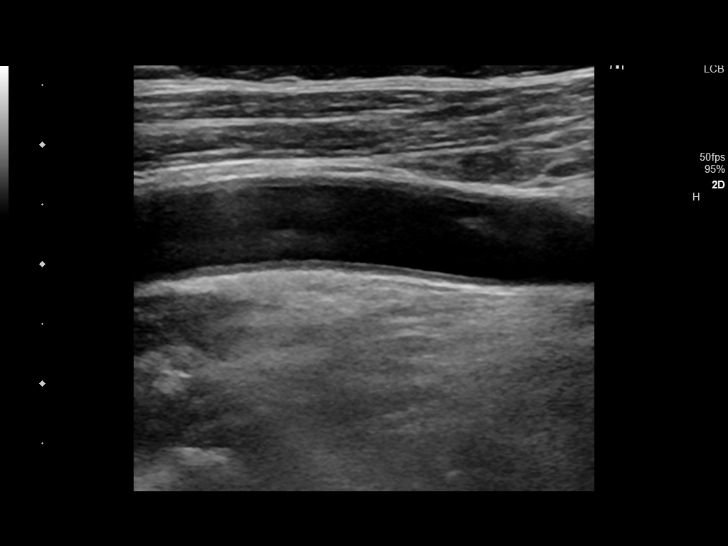
[im 12/66]
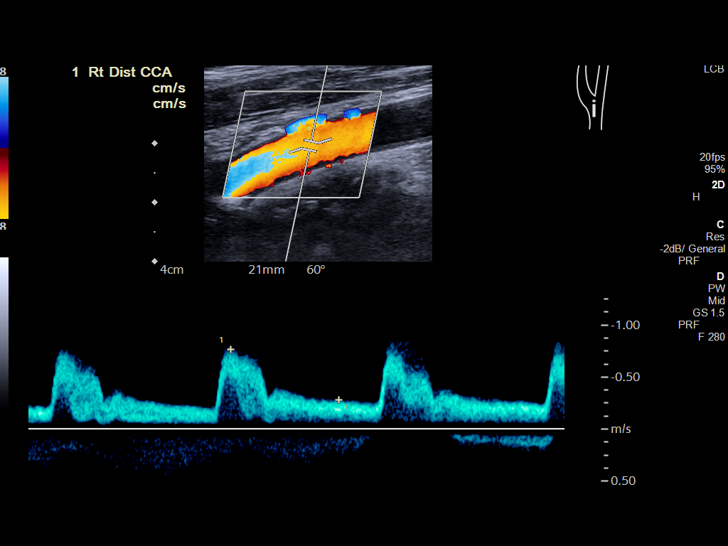
[im 17/66]
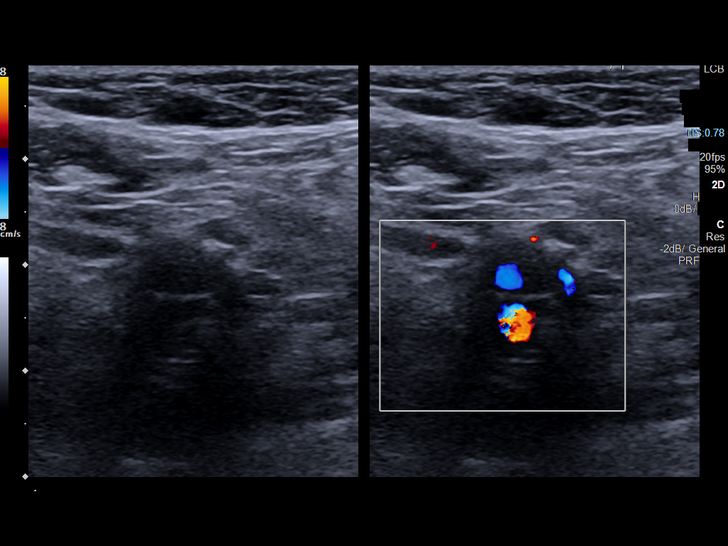
[im 23/66]
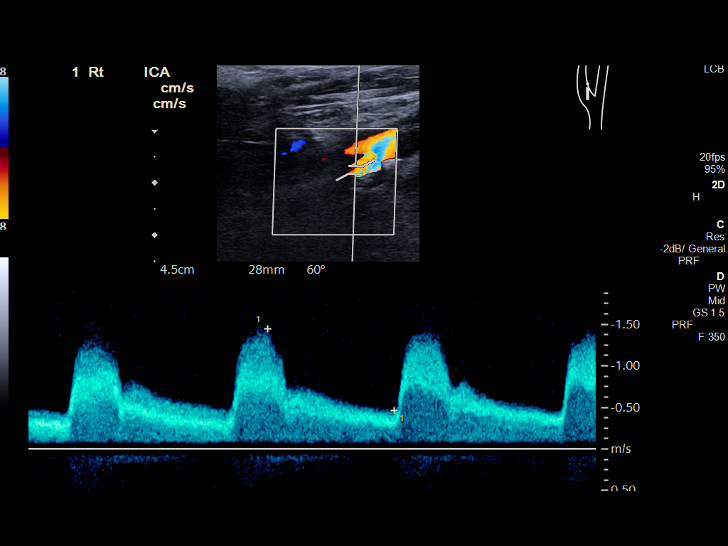
[im 29/66]
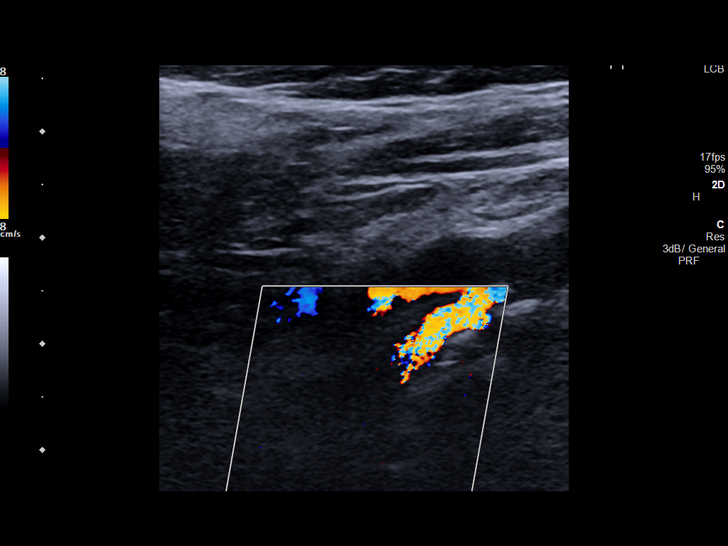
[im 34/66]
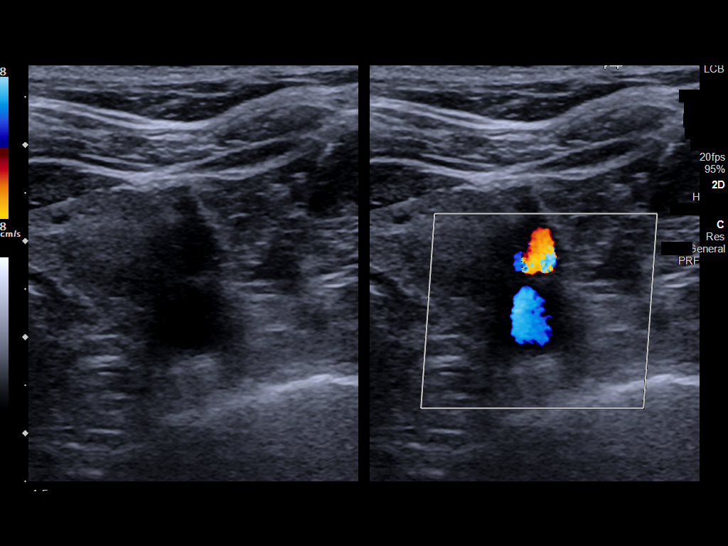
[im 37/66]
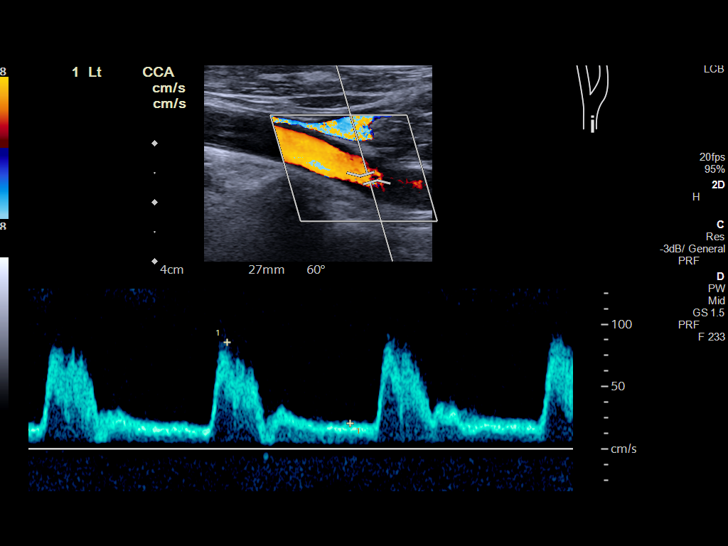
[im 43/66]
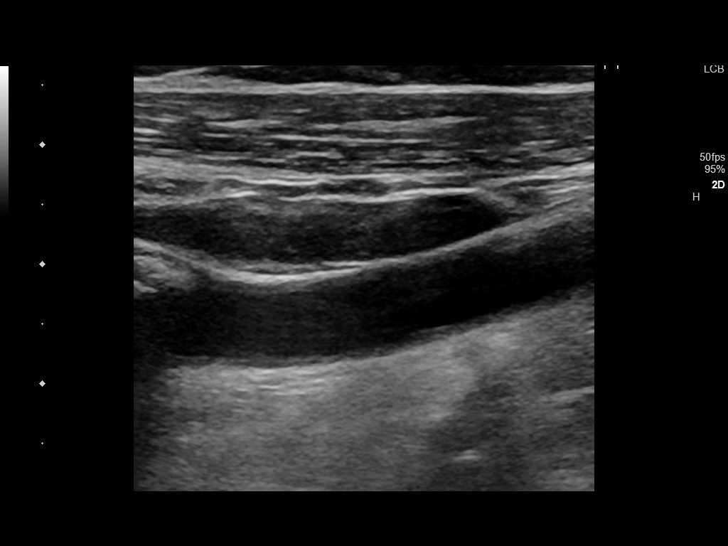
[im 49/66]
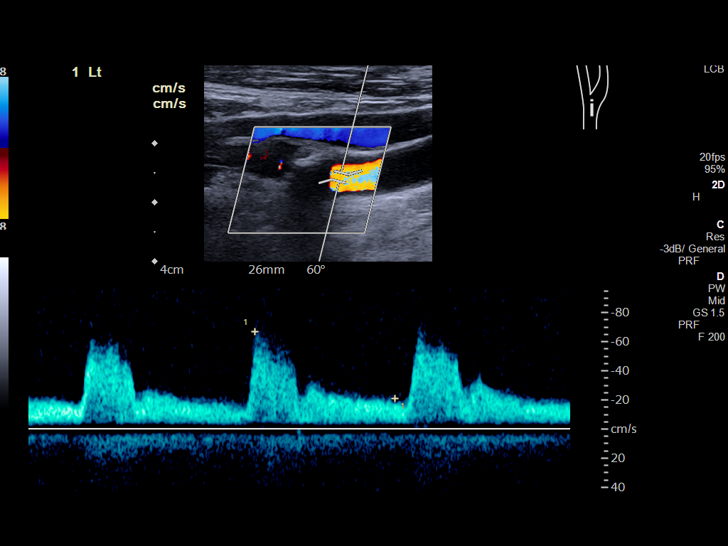
[im 54/66]
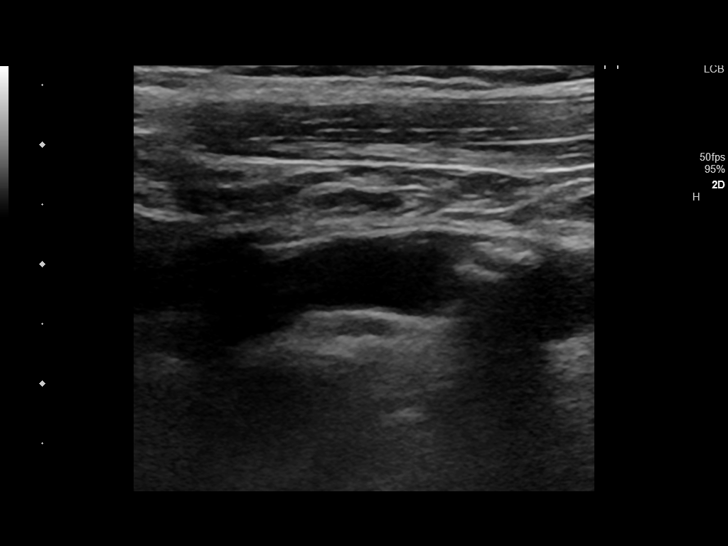
[im 60/66]
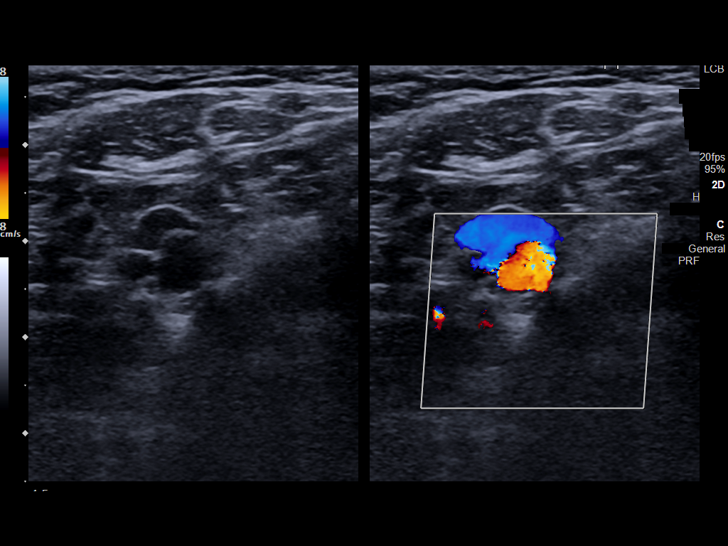
[im 66/66]
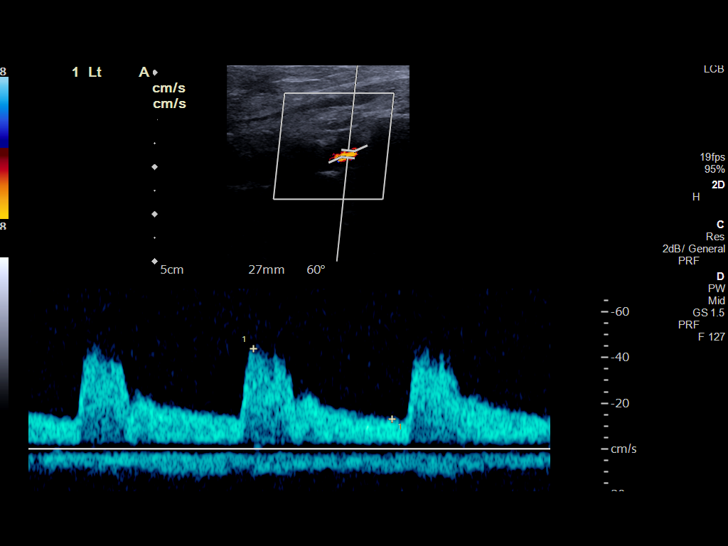

[13 of 24 positions shown; findings below may reference images not displayed]

FINDINGS: Criteria: Quantification of carotid stenosis is based on velocity
parameters that correlate the residual internal carotid diameter
with NASCET-based stenosis levels, using the diameter of the distal
internal carotid lumen as the denominator for stenosis measurement.

The following velocity measurements were obtained:

RIGHT

ICA: 144/46 cm/sec

CCA: 75/21 cm/sec

SYSTOLIC ICA/CCA RATIO:

ECA: 70 cm/sec

LEFT

ICA: 128/45 cm/sec

CCA: 82/28 cm/sec

SYSTOLIC ICA/CCA RATIO:

ECA: 111 cm/sec

RIGHT CAROTID ARTERY: Eccentric partially calcified plaque at the
carotid bifurcation extending into the ICA resulting in at least
mild stenosis. Mildly elevated peak systolic velocities at the level
of stenosis. Otherwise normal waveforms and color Doppler signal.

RIGHT VERTEBRAL ARTERY:  Normal flow direction and waveform.

LEFT CAROTID ARTERY: Eccentric partially calcified plaque in the
bulb. Normal waveforms and color Doppler signal. Mildly tortuous
ICA.

LEFT VERTEBRAL ARTERY:  Normal flow direction and waveform.
IMPRESSION: 1. Bilateral carotid bifurcation plaque resulting in less than 50%
diameter stenosis on the left, 50-69% diameter proximal right ICA
stenosis.
2.  Antegrade bilateral vertebral arterial flow.

## 2019-12-10 IMAGING — MR MR MRA HEAD W/O CM
13 of 15 series · 36 of 48 positions shown · non-contrast
Comparison: CT head without contrast 08/22/2018. MRI brain
10/05/2017

CLINICAL DATA: Neuro deficits, subacute. Heaviness of the left
upper and lower extremity upon waking today. Personal history of
CVA. Headaches over the last several days. Numbness in the left
upper and lower extremities. Strength is preserved.

EXAM:
MRI HEAD WITHOUT CONTRAST
MRA HEAD WITHOUT CONTRAST
TECHNIQUE: Multiplanar, multiecho pulse sequences of the brain and surrounding
structures were obtained without intravenous contrast. Angiographic
images of the head were obtained using MRA technique without
contrast.

[Series 5: ax dwi_tracew · axial · 3.0mm · 0.73mm/px · z∈[-36,+123]mm · 3 of 54 slices shown (1 of 2)]
[im 1/54]
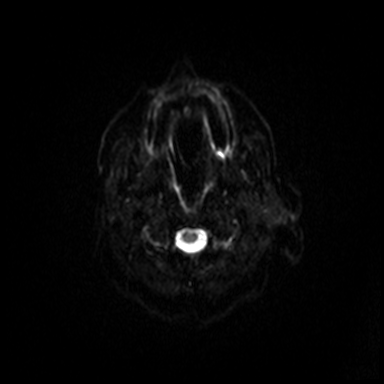
[im 27/54]
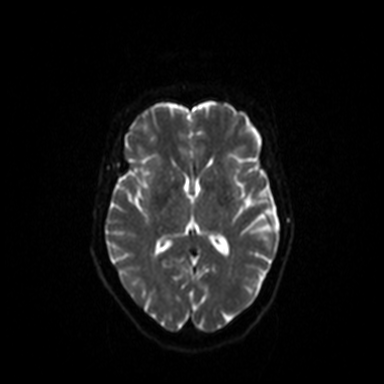
[im 54/54]
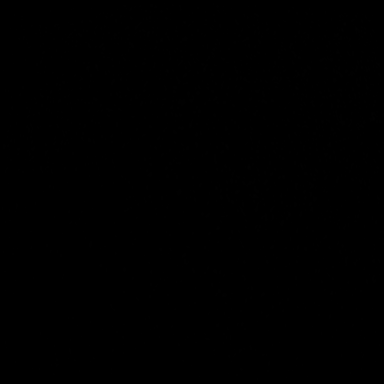

[Series 5: ax dwi_tracew · axial · 3.0mm · 0.73mm/px · z∈[-39,+123]mm · 3 of 55 slices shown (2 of 2)]
[im 1/55]
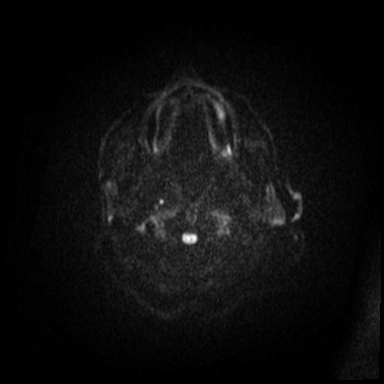
[im 28/55]
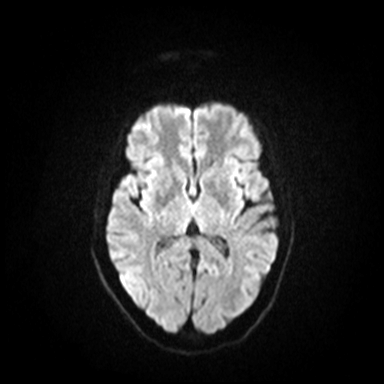
[im 55/55]
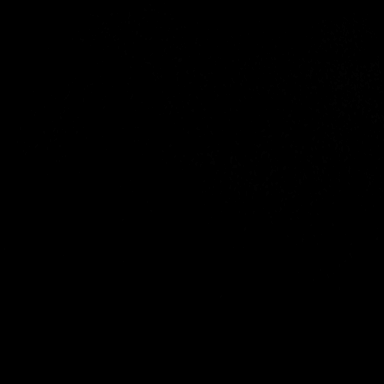

[Series 6: ax dwi_adc · axial · 3.0mm · 0.73mm/px · z∈[-39,+120]mm · 3 of 54 slices shown]
[im 1/54]
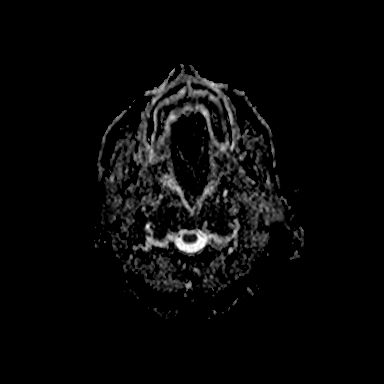
[im 27/54]
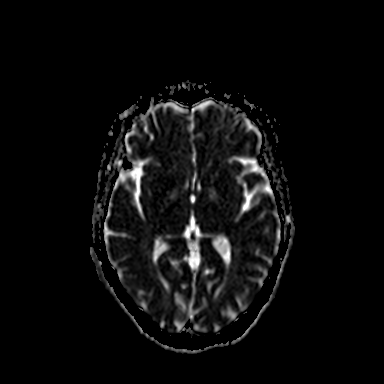
[im 54/54]
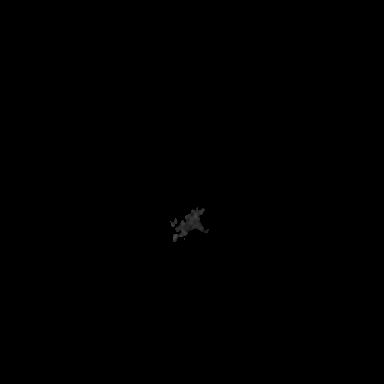

[Series 7: cor dwi_tracew · coronal · 5.0mm · 0.60mm/px · 2 of 38 slices shown (1 of 2)]
[im 1/38]
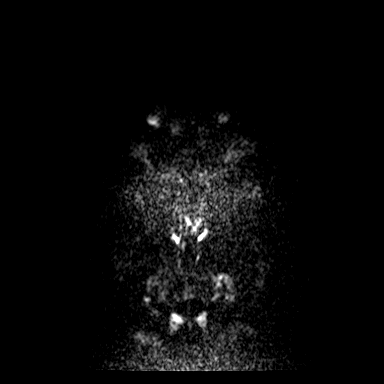
[im 38/38]
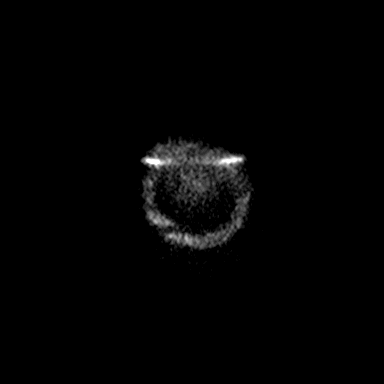

[Series 7: cor dwi_tracew · coronal · 5.0mm · 0.60mm/px · 2 of 38 slices shown (2 of 2)]
[im 1/38]
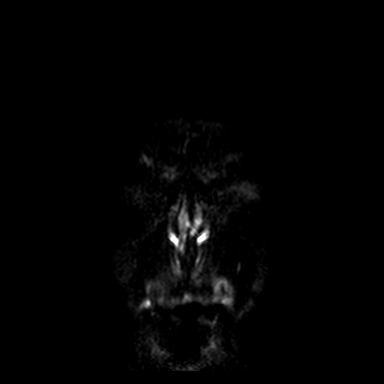
[im 38/38]
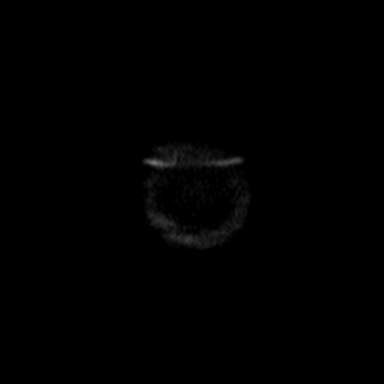

[Series 8: cor dwi_adc · coronal · 5.0mm · 0.60mm/px · 2 of 38 slices shown]
[im 1/38]
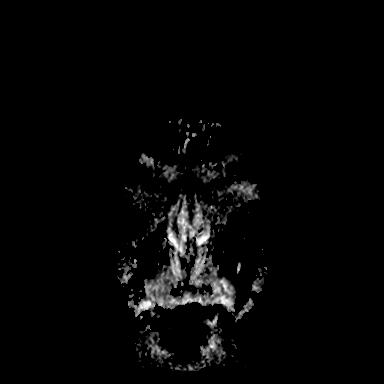
[im 38/38]
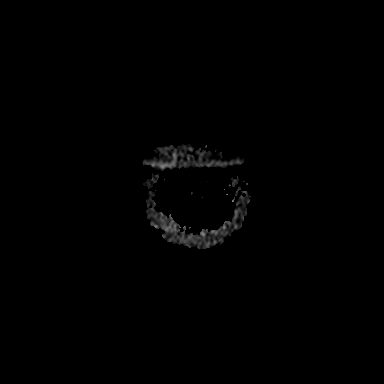

[Series 14: T1 · sagittal · 5.0mm · 0.62mm/px · 1 of 25 slices shown (1 of 2)]
[im 1/25]
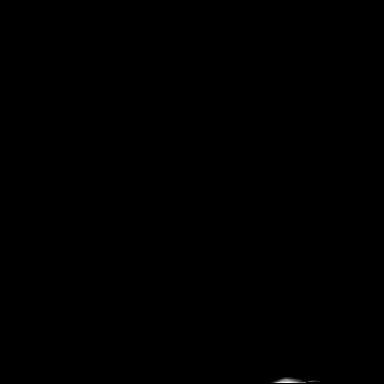

[Series 15: T2 · axial · 5.0mm · 0.53mm/px · 1 of 27 slices shown (1 of 2)]
[im 1/27]
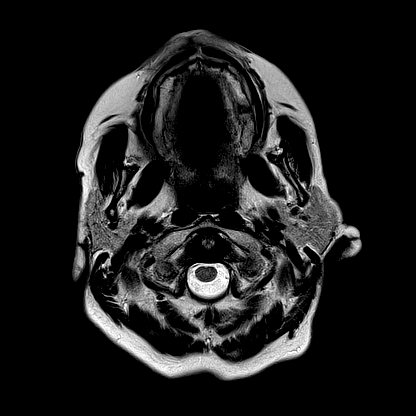

[Series 16: swi_images · axial · 3.0mm · 0.90mm/px · z∈[-46,+131]mm · 3 of 60 slices shown]
[im 1/60]
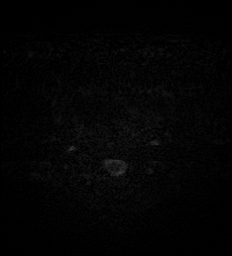
[im 30/60]
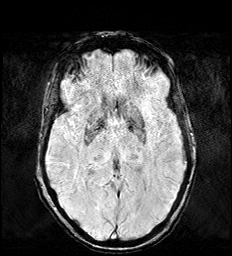
[im 60/60]
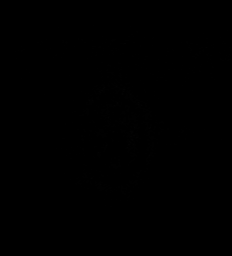

[Series 17: mip_images(sw) · axial · 24.0mm · 0.90mm/px · z∈[-36,+120]mm · 3 of 53 slices shown]
[im 1/53]
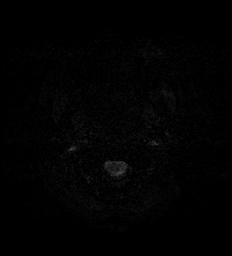
[im 27/53]
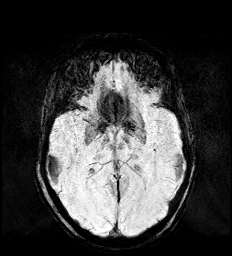
[im 53/53]
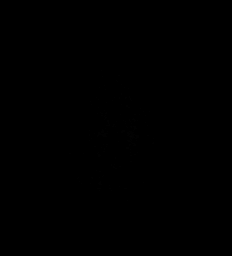

[Series 18: FLAIR · axial · 3.0mm · 0.53mm/px · z∈[-38,+124]mm · 3 of 55 slices shown]
[im 1/55]
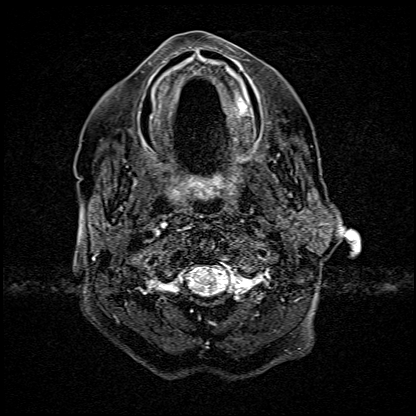
[im 28/55]
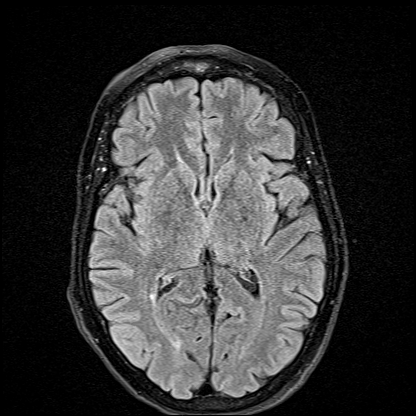
[im 55/55]
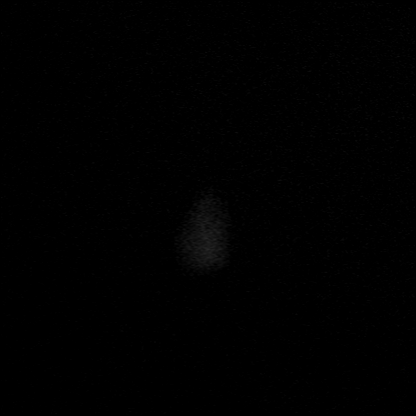

[Series 19: T1 · axial · 1.0mm · 0.98mm/px · z∈[-45,+130]mm · 8 of 175 slices shown (2 of 2)]
[im 1/175]
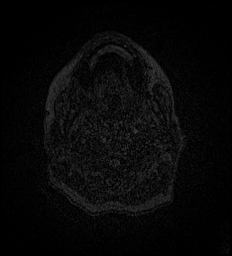
[im 20/175]
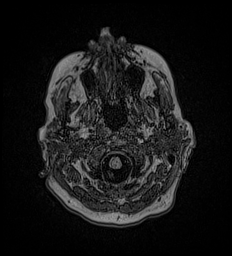
[im 59/175]
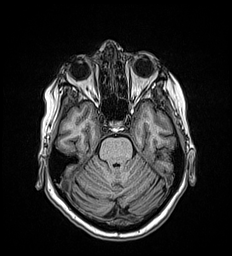
[im 78/175]
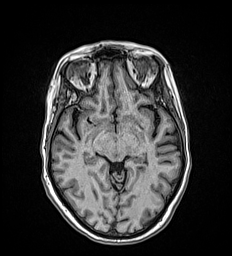
[im 97/175]
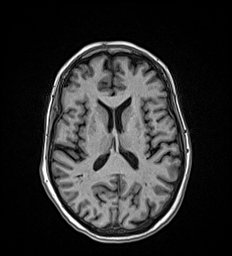
[im 117/175]
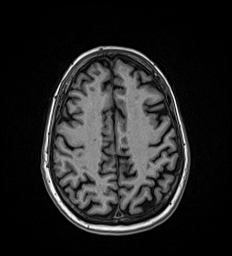
[im 155/175]
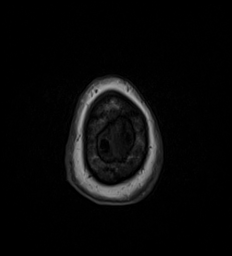
[im 175/175]
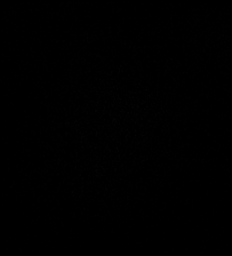

[Series 20: T2 · coronal · 5.0mm · 0.57mm/px · 2 of 29 slices shown (2 of 2)]
[im 1/29]
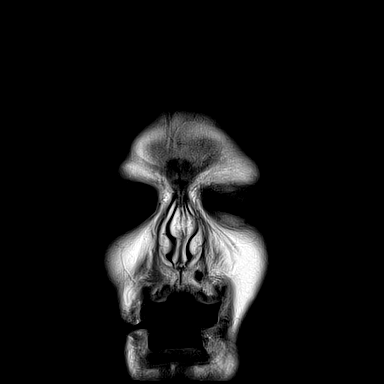
[im 29/29]
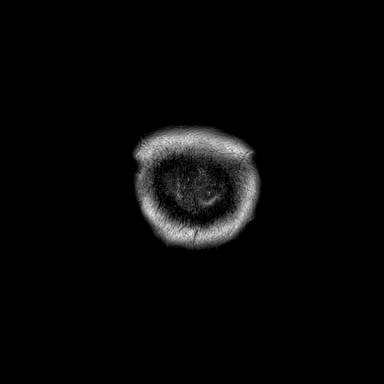

[36 of 48 positions shown; findings below may reference images not displayed]

FINDINGS: MRI HEAD FINDINGS

Brain: The diffusion-weighted images demonstrate no acute or
subacute infarction. Scattered periventricular and subcortical T2
hyperintensities are mildly advanced for age. No acute hemorrhage or
mass lesion is present. The ventricles are of normal size. No
significant extra-axial fluid collection is present.

The brainstem and cerebellum are normal.

Vascular: Flow is present in the major intracranial arteries.

Skull and upper cervical spine: The skull base is within normal
limits. The craniocervical junction is normal. Upper cervical spine
is within normal limits.

Sinuses/Orbits: The paranasal sinuses and mastoid air cells are
clear. Globes and orbits are within normal limits.

MRA HEAD FINDINGS

Internal carotid arteries are within normal limits from the high
cervical segments through the ICA termini. The A1 and M1 segments
are normal. No definite anterior communicating artery is patent.
There is noted bifurcation of the right MCA, a normal variant. The
ACA and MCA branch vessels are within normal limits.

The vertebral arteries are codominant. PICA origin is visualized and
within normal limits bilaterally. The basilar artery is normal. Both
posterior cerebral arteries originate from the basilar tip. PCA
branch vessels are within normal limits.
IMPRESSION: 1. No acute intracranial abnormality.
2. Scattered periventricular and subcortical T2 hyperintensities
bilaterally are mildly advanced for age. The finding is nonspecific
but can be seen in the setting of chronic microvascular ischemia, a
demyelinating process such as multiple sclerosis, vasculitis,
complicated migraine headaches, or as the sequelae of a prior
infectious or inflammatory process.
3. Normal MRA circle-of-Willis without significant proximal
stenosis, aneurysm, or branch vessel occlusion.

## 2019-12-21 ENCOUNTER — Encounter: Payer: Self-pay | Admitting: Family Medicine

## 2019-12-21 ENCOUNTER — Other Ambulatory Visit: Payer: Self-pay

## 2019-12-21 ENCOUNTER — Emergency Department: Payer: Medicare HMO

## 2019-12-21 ENCOUNTER — Encounter: Payer: Self-pay | Admitting: Emergency Medicine

## 2019-12-21 ENCOUNTER — Emergency Department
Admission: EM | Admit: 2019-12-21 | Discharge: 2019-12-21 | Disposition: A | Discharge status: Home / Self Care | Payer: Medicare HMO | Attending: Emergency Medicine | Admitting: Emergency Medicine

## 2019-12-21 ENCOUNTER — Ambulatory Visit (INDEPENDENT_AMBULATORY_CARE_PROVIDER_SITE_OTHER): Payer: Medicare HMO | Admitting: Family Medicine

## 2019-12-21 DIAGNOSIS — R05 Cough: Secondary | ICD-10-CM | POA: Insufficient documentation

## 2019-12-21 DIAGNOSIS — Z20822 Contact with and (suspected) exposure to covid-19: Secondary | ICD-10-CM | POA: Diagnosis not present

## 2019-12-21 DIAGNOSIS — Z8673 Personal history of transient ischemic attack (TIA), and cerebral infarction without residual deficits: Secondary | ICD-10-CM | POA: Diagnosis not present

## 2019-12-21 DIAGNOSIS — I509 Heart failure, unspecified: Secondary | ICD-10-CM

## 2019-12-21 DIAGNOSIS — R5383 Other fatigue: Secondary | ICD-10-CM | POA: Diagnosis not present

## 2019-12-21 DIAGNOSIS — J189 Pneumonia, unspecified organism: Secondary | ICD-10-CM

## 2019-12-21 DIAGNOSIS — G4736 Sleep related hypoventilation in conditions classified elsewhere: Secondary | ICD-10-CM

## 2019-12-21 DIAGNOSIS — J449 Chronic obstructive pulmonary disease, unspecified: Secondary | ICD-10-CM | POA: Diagnosis not present

## 2019-12-21 DIAGNOSIS — R0789 Other chest pain: Secondary | ICD-10-CM | POA: Diagnosis present

## 2019-12-21 DIAGNOSIS — N183 Chronic kidney disease, stage 3 unspecified: Secondary | ICD-10-CM | POA: Insufficient documentation

## 2019-12-21 DIAGNOSIS — Z7982 Long term (current) use of aspirin: Secondary | ICD-10-CM | POA: Diagnosis not present

## 2019-12-21 DIAGNOSIS — Z87891 Personal history of nicotine dependence: Secondary | ICD-10-CM | POA: Insufficient documentation

## 2019-12-21 DIAGNOSIS — I130 Hypertensive heart and chronic kidney disease with heart failure and stage 1 through stage 4 chronic kidney disease, or unspecified chronic kidney disease: Secondary | ICD-10-CM | POA: Diagnosis not present

## 2019-12-21 DIAGNOSIS — Z20828 Contact with and (suspected) exposure to other viral communicable diseases: Secondary | ICD-10-CM | POA: Insufficient documentation

## 2019-12-21 LAB — CBC
HCT: 32.3 % — ABNORMAL LOW (ref 36.0–46.0)
Hemoglobin: 10 g/dL — ABNORMAL LOW (ref 12.0–15.0)
MCH: 24.2 pg — ABNORMAL LOW (ref 26.0–34.0)
MCHC: 31 g/dL (ref 30.0–36.0)
MCV: 78 fL — ABNORMAL LOW (ref 80.0–100.0)
Platelets: 333 10*3/uL (ref 150–400)
RBC: 4.14 MIL/uL (ref 3.87–5.11)
RDW: 15.6 % — ABNORMAL HIGH (ref 11.5–15.5)
WBC: 10.3 10*3/uL (ref 4.0–10.5)
nRBC: 0 % (ref 0.0–0.2)

## 2019-12-21 LAB — BASIC METABOLIC PANEL
Anion gap: 11 (ref 5–15)
BUN: 15 mg/dL (ref 8–23)
CO2: 31 mmol/L (ref 22–32)
Calcium: 8.7 mg/dL — ABNORMAL LOW (ref 8.9–10.3)
Chloride: 97 mmol/L — ABNORMAL LOW (ref 98–111)
Creatinine, Ser: 0.94 mg/dL (ref 0.44–1.00)
GFR calc Af Amer: >60 mL/min (ref >60)
GFR calc non Af Amer: >60 mL/min (ref >60)
Glucose, Bld: 123 mg/dL — ABNORMAL HIGH (ref 70–99)
Potassium: 3.8 mmol/L (ref 3.5–5.1)
Sodium: 139 mmol/L (ref 135–145)

## 2019-12-21 LAB — TROPONIN I (HIGH SENSITIVITY): Troponin I (High Sensitivity): 4 ng/L (ref <18)

## 2019-12-21 LAB — POC SARS CORONAVIRUS 2 AG: SARS Coronavirus 2 Ag: NEGATIVE

## 2019-12-21 MED ORDER — ALBUTEROL SULFATE HFA 108 (90 BASE) MCG/ACT IN AERS: 2.0000 | INHALATION_SPRAY | Freq: Four times a day (QID) | RESPIRATORY_TRACT | 1 refills | Status: DC | PRN

## 2019-12-21 MED ORDER — LEVOFLOXACIN 750 MG PO TABS: 750.0000 mg | ORAL_TABLET | Freq: Every day | ORAL | 0 refills | Status: AC

## 2019-12-21 MED ORDER — ALBUTEROL SULFATE HFA 108 (90 BASE) MCG/ACT IN AERS: 2.0000 | INHALATION_SPRAY | Freq: Four times a day (QID) | RESPIRATORY_TRACT | 0 refills | Status: DC | PRN

## 2019-12-21 NOTE — ED Triage Notes (Signed)
Pt reports CP, cough, weakness and bodyaches since Friday. Pt reports called MD and was advised to come to the ED and be checked for COVID.

## 2019-12-21 NOTE — ED Notes (Signed)
Esign not working at this time. Pt verbalized discharge instructions and has no questions at this time. 

## 2019-12-21 NOTE — Progress Notes (Signed)
Virtual Visit via Telephone Note  I connected with Morgan [name] on 12/21/19 at 10:20 AM EST by telephone and verified that I am speaking with the correct person using two identifiers.   I discussed the limitations, risks, security and privacy concerns of performing an evaluation and management service by telephone and the availability of in person appointments. I also discussed with the patient that there may be a patient responsible charge related to this service. The patient expressed understanding and agreed to proceed.  Patient location: Home Provider Location: [place] Participants: Morgan [name] and Valaria Good   History of Present Illness: Chief Complaint  Patient presents with  . Cough    Sob, weak all over. Started on 12/18/2019. Getting worse.    Cough This is a new problem. The current episode started in the past 7 days. The problem has been gradually worsening. The cough is non-productive. Associated symptoms include chest pain (general symptoms, worse with deep breath), a fever, headaches and shortness of breath. Pertinent negatives include no chills, myalgias, nasal congestion, rhinorrhea, sweats or wheezing. She has tried a beta-agonist inhaler (theraflu ) for the symptoms. The treatment provided no relief. Her past medical history is significant for COPD.   Did get the flu shot Still on oxygen - cannot breath without her oxygen - on 3 L Getting more SOB - normally she can only do oxygen at night - is having to keep the O2  No weight gain Has noticed that her ankles are swelling  Sick contact: no No loss of taste or smell  Review of Systems  Constitutional: Positive for fever. Negative for chills.  HENT: Negative for rhinorrhea.   Respiratory: Positive for cough and shortness of breath. Negative for wheezing.   Cardiovascular: Positive for chest pain (general symptoms, worse with deep breath).  Musculoskeletal: Negative for myalgias.  Neurological:  Positive for headaches.      Observations/Objective: There were no vitals taken for this visit.  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 Unable to do virtual visit. Offered evening clinic, however, pt cannot drive at night.   Due to increased O2 need - recommended that she go to urgent care to be evaluated. Does not have home O2 monitor. Also with some CP - which sounds related to coughing and MSK but also warrants evaluation. Lower suspicion for COPD exacerbation (no sputum production) or CHF (no weight gain, though did not not ankle swelling) - though difficult to determine without exam.   Follow Up Instructions:  Return if symptoms worsen or fail to improve.   I discussed the assessment and treatment plan with the patient. The patient was provided an opportunity to ask questions and all were answered. The patient agreed with the plan and demonstrated an understanding of the instructions.   The patient was advised to call back or seek an in-person evaluation if the symptoms worsen or if the condition fails  to improve as anticipated.  I provided 11 minutes of non-face-to-face time during this encounter.   Morgan Noe, MD

## 2019-12-21 NOTE — ED Provider Notes (Signed)
[facility] Emergency Department Provider Note   ____________________________________________    I have reviewed the triage vital signs and the nursing notes.   HISTORY  Chief Complaint Chest Pain, Cough, and Weakness     HPI Morgan [name] is a 69 y.o. female who presents with complaints of chest discomfort, frequent cough and fatigue.  Patient has a history of COPD, CKD, CHF.  She reports she started feeling this way 3 days ago.  Does not believe that she has had any exposures to positive coronavirus patients.  No loss of taste or smell.  Reports productive cough.  Has not take anything for this  Past Medical History:  Diagnosis Date  . Allergy   . Anemia   . Anxiety   . Arthritis   . Depression   . GERD (gastroesophageal reflux disease)   . Hypertension   . MVP (mitral valve prolapse)     Patient Active Problem List   Diagnosis Date Noted  . Fall 04/08/2019  . Abdominal pain 02/19/2019  . Viral gastroenteritis 02/19/2019  . Asymptomatic bacteriuria 02/19/2019  . COPD exacerbation ([place]) 02/16/2019  . CKD (chronic kidney disease) stage 3, GFR 30-59 ml/min 01/13/2019  . CHF (congestive heart failure) ([place]) 01/13/2019  . Dizziness 01/13/2019  . Hoarseness 11/12/2018  . Oropharyngeal dysphagia 11/12/2018  . Sleepiness 11/12/2018  . CVA (cerebral vascular accident) ([place]) 08/22/2018  . Left-sided weakness 10/04/2017  . HLD (hyperlipidemia) 10/04/2017  . GERD (gastroesophageal reflux disease) 10/04/2017  . Anxiety 08/26/2017  . Essential hypertension   . Depression   . Diarrhea 02/22/2015  . Hypokalemia 02/22/2015  . Chronic lower back pain 04/29/2013  . Paresthesia 04/29/2013    Past Surgical History:  Procedure Laterality Date  . ABDOMINAL HYSTERECTOMY    . APPENDECTOMY    . BREAST SURGERY Right 1988   tumor removal  . KNEE SURGERY  2005   2 times left  . TUBAL LIGATION      Prior to Admission medications   Medication Sig  Start Date End Date Taking? Authorizing Provider  acetaminophen (TYLENOL) 500 MG tablet Take 1,000 mg by mouth every 6 (six) hours as needed (pain).     [provider]  albuterol (VENTOLIN HFA) 108 (90 Base) MCG/ACT inhaler Inhale 2 puffs into the lungs every 6 (six) hours as needed for wheezing or shortness of breath. 12/21/19   Lavonia Drafts, MD  aspirin EC 325 MG EC tablet Take 1 tablet (325 mg total) by mouth daily. 10/07/17   Dustin Flock, MD  atorvastatin (LIPITOR) 40 MG tablet Take 40 mg by mouth at bedtime.    [provider]  colestipol (COLESTID) 1 g tablet TAKE 1 TABLET (1 G TOTAL) BY MOUTH 2 (TWO) TIMES DAILY. 07/20/19   Mauri Pole, MD  esomeprazole (NEXIUM) 40 MG capsule TAKE 1 CAPSULE (40 MG TOTAL) BY MOUTH DAILY AT 12 NOON. 07/20/19   Nandigam, Venia Minks, MD  Ferrous Sulfate (IRON) 325 (65 Fe) MG TABS Take 1 tablet (325 mg total) by mouth daily. 04/09/19   [name], MD  furosemide (LASIX) 40 MG tablet HOLD UNTIL seen by your doctor Patient taking differently: Take 40 mg by mouth daily.  02/19/19   Oretha Milch D, MD  gabapentin (NEURONTIN) 400 MG capsule Take 400 mg by mouth 3 (three) times daily.  10/20/18   [provider]  levofloxacin (LEVAQUIN) 750 MG tablet Take 1 tablet (750 mg total) by mouth daily for 7 days.  12/21/19 12/28/19  Lavonia Drafts, MD  mirtazapine (REMERON) 45 MG tablet Take 45 mg by mouth at bedtime.     [provider]     Allergies Demerol [meperidine] and Strawberry (diagnostic)  Family History  Problem Relation Age of Onset  . Breast cancer Mother 4  . Ovarian cancer Mother 40  . Brain cancer Mother   . Hypertension Father   . Hyperlipidemia Father   . Heart disease Father   . Breast cancer Maternal Grandmother 40  . Diabetes Paternal Grandmother   . Breast cancer Paternal Grandmother 37  . Cancer Paternal Grandfather   . Breast cancer Maternal Aunt 60  . Breast cancer Maternal Aunt 60    Social  History Social History   Tobacco Use  . Smoking status: Former Smoker    Packs/day: 1.00    Years: 15.00    Pack years: 15.00    Types: Cigarettes    Quit date: 09/12/2017    Years since quitting: 2.2  . Smokeless tobacco: Never Used  Substance Use Topics  . Alcohol use: Not Currently  . Drug use: No    Review of Systems  Constitutional: Fatigue Eyes: No visual changes.  ENT: No sore throat. Cardiovascular: As above Respiratory: As above Gastrointestinal: No abdominal pain.  No nausea, no vomiting.   Genitourinary: Negative for dysuria. Musculoskeletal: Negative for back pain. Skin: Negative for rash. Neurological: Negative for headaches   ____________________________________________   PHYSICAL EXAM:  VITAL SIGNS: ED Triage Vitals  Enc Vitals Group     BP 12/21/19 1139 (!) 193/87     Pulse Rate 12/21/19 1139 68     Resp 12/21/19 1139 18     Temp 12/21/19 1139 98.9 F (37.2 C)     Temp src --      SpO2 12/21/19 1139 97 %     Weight 12/21/19 1125 117.9 kg (260 lb)     Height --      Head Circumference --      Peak Flow --      Pain Score 12/21/19 1125 5     Pain Loc --      Pain Edu? --      Excl. in [place]? --     Constitutional: Alert and oriented.  Nose: No congestion/rhinnorhea. Mouth/Throat: Mucous membranes are moist.    Cardiovascular: Normal rate, regular rhythm. Grossly normal heart sounds.  Good peripheral circulation. Respiratory: Normal respiratory effort.  No retractions.  Scattered wheezes Gastrointestinal: Soft and nontender. No distention.  No CVA tenderness.  Musculoskeletal:   Warm and well perfused Neurologic:  Normal speech and language. No gross focal neurologic deficits are appreciated.  Skin:  Skin is warm, dry and intact. No rash noted. Psychiatric: Mood and affect are normal. Speech and behavior are normal.  ____________________________________________   LABS (all labs ordered are listed, but only abnormal results are  displayed)  Labs Reviewed  BASIC METABOLIC PANEL - Abnormal; Notable for the following components:      Result Value   Chloride 97 (*)    Glucose, Bld 123 (*)    Calcium 8.7 (*)    All other components within normal limits  CBC - Abnormal; Notable for the following components:   Hemoglobin 10.0 (*)    HCT 32.3 (*)    MCV 78.0 (*)    MCH 24.2 (*)    RDW 15.6 (*)    All other components within normal limits  POC SARS CORONAVIRUS 2 AG -  ED  POC SARS CORONAVIRUS 2 AG  TROPONIN I (HIGH SENSITIVITY)   ____________________________________________  EKG  ED ECG REPORT I, Lavonia Drafts, the attending physician, personally viewed and interpreted this ECG.  Date: 12/21/2019  Rhythm: normal sinus rhythm QRS Axis: normal Intervals: normal ST/T Wave abnormalities: normal Narrative Interpretation: no evidence of acute ischemia  ____________________________________________  RADIOLOGY  Chest x-ray consistent with lingular pneumonia ____________________________________________   PROCEDURES  Procedure(s) performed: No  Procedures   Critical Care performed: No ____________________________________________   INITIAL IMPRESSION / ASSESSMENT AND PLAN / ED COURSE  Pertinent labs & imaging results that were available during my care of the patient were reviewed by me and considered in my medical decision making (see chart for details).  Patient presents with cough, mild shortness of breath, chest discomfort.  Afebrile but significant fatigue.  Differential includes novel coronavirus, pneumonia.  Lab work is overall quite reassuring, EKG is unremarkable, not consistent with ACS.  Chest x-ray demonstrates lingular pneumonia most consistent with bacterial CAP.  Rapid Covid swab is negative.  We will treat with Levaquin, albuterol inhaler, strict return precautions, outpatient follow-up with PCP.    ____________________________________________   FINAL CLINICAL IMPRESSION(S) /  ED DIAGNOSES  Final diagnoses:  Community acquired pneumonia of left lung, unspecified part of lung        Note:  This document was prepared using Dragon voice recognition software and may include unintentional dictation errors.   Lavonia Drafts, MD 12/21/19 [phone]

## 2019-12-25 ENCOUNTER — Telehealth: Payer: Self-pay

## 2019-12-25 MED ORDER — METHOCARBAMOL 500 MG PO TABS: 500.0000 mg | ORAL_TABLET | Freq: Two times a day (BID) | ORAL | 0 refills | Status: DC | PRN

## 2019-12-25 MED ORDER — TIZANIDINE HCL 2 MG PO TABS: 2.0000 mg | ORAL_TABLET | Freq: Two times a day (BID) | ORAL | 0 refills | Status: DC | PRN

## 2019-12-25 NOTE — Telephone Encounter (Signed)
Spoke with Morgan [name] at [facility] and Robaxin is not covered but out of pocket it will be $9.40. I advised patient of this and she can do that. Tizanidine was cancelled.

## 2019-12-25 NOTE — Telephone Encounter (Signed)
Anna pharmacist at [facility] left v/m that they received rx for tizanidine and pt was also prescribed by Dr Corky Downs levofloxacin which can have an drug interaction with tizanidine that might result in interval prolongation and Morgan [name] wants to know what to do and request cb.

## 2019-12-25 NOTE — Telephone Encounter (Signed)
Ok let's price out robaxin in its place.

## 2019-12-25 NOTE — Telephone Encounter (Signed)
Patient called stating that she pulled her back, she was buckling in her grandson in the car seat yesterday and when she reached out to buckle him in she felt a pull/twinge. Her pain is getting worse, it is like a spasm, "a knot feeling that is been pulled on." pain located in the lower left back and pain radiates down to left leg with movements. She is ok if she is standing or been still but things like bending down she hurts. Patient has tried ice, heat and CBD oil with no relief. Symptoms got worse since yesterday. (no appointments for this afternoon left)  If able to send something is [facility] uses [facility] [place] Dr. Verda Cumins patient

## 2019-12-25 NOTE — Addendum Note (Signed)
Addended by: Ria Bush on: 12/25/2019 05:04 PM   Modules accepted: Orders

## 2019-12-25 NOTE — Telephone Encounter (Signed)
Recommend scheduled tylenol 500mg  TID with meals and heating pad to back.  May try short course of tizanidine muscle relaxant over weekend, if no better schedule appt with PCP on Monday.

## 2019-12-25 NOTE — Telephone Encounter (Signed)
Patient advised.

## 2019-12-28 NOTE — Telephone Encounter (Signed)
Pt left v/m that her back is killing her and she has twisted her knee and now knee is swollen and hurting. Pt has been alternating ice and heat and CBD oil which is not helping.  Unable to reach pt and left v/m per DPR to call office for covid screening and in office appt if possible with Dr Einar Pheasant on 12/29/19. If pt needs to be seen before tomorrow will need to go [facility] or ED. Pt to cb [facility] for scheduling.

## 2019-12-29 ENCOUNTER — Telehealth: Payer: Self-pay

## 2019-12-29 ENCOUNTER — Ambulatory Visit (INDEPENDENT_AMBULATORY_CARE_PROVIDER_SITE_OTHER): Payer: Medicare HMO | Admitting: Family Medicine

## 2019-12-29 ENCOUNTER — Other Ambulatory Visit: Payer: Self-pay

## 2019-12-29 VITALS — BP 154/100 | HR 84 | Temp 96.5°F | Resp 24 | Ht 70.5 in | Wt 240.5 lb

## 2019-12-29 DIAGNOSIS — M545 Low back pain, unspecified: Secondary | ICD-10-CM

## 2019-12-29 DIAGNOSIS — M25561 Pain in right knee: Secondary | ICD-10-CM | POA: Diagnosis not present

## 2019-12-29 MED ORDER — CYCLOBENZAPRINE HCL 5 MG PO TABS: 5.0000 mg | ORAL_TABLET | Freq: Three times a day (TID) | ORAL | 1 refills | Status: DC | PRN

## 2019-12-29 NOTE — Telephone Encounter (Signed)
Patient made an appointment for today

## 2019-12-29 NOTE — Telephone Encounter (Signed)
PA for flexeril submitted through covermymeds and awaiting reply.

## 2019-12-29 NOTE — Assessment & Plan Note (Signed)
Suspect low back strain. Pt avoids NSAIDs due to chronic medication condition. Cont tylenol, flexeril prn. Discussed importance of stretching but given severity of knee pain will focus on treating that before back pain.

## 2019-12-29 NOTE — Assessment & Plan Note (Signed)
Pt with hx of meniscus tear requiring surgery now with acute knee pain unable to ambulate. No specific injury so did not get XR today, but given surgical hx referral to ortho for further evaluation and treatment.

## 2019-12-29 NOTE — Progress Notes (Signed)
Subjective:     Morgan [name] is a 69 y.o. female presenting for Back Pain (left lower back) and Knee Pain (right knee)     HPI  #Back pain - pulled her back out - not sure how she did this - still bad - treatment: tylenol - on the left side - no specific injury - was buckling her grandson in the car and thinks she pulled something   #Right Knee pain - using crutches - thinks she was putting pressure on the wrong side - swelling and pain in the knee - not sure of a specific injury  - not sure if twisted or just pressure related - treatment: tylenol, ice, heat   Review of Systems  Constitutional: Negative for chills and fever.     Social History   Tobacco Use  Smoking Status Former Smoker  . Packs/day: 1.00  . Years: 15.00  . Pack years: 15.00  . Types: Cigarettes  . Quit date: 09/12/2017  . Years since quitting: 2.2  Smokeless Tobacco Never Used        Objective:    BP Readings from Last 3 Encounters:  12/29/19 (!) 154/100  12/21/19 (!) 193/87  07/15/19 140/82   Wt Readings from Last 3 Encounters:  12/29/19 240 lb 8 oz (109.1 kg)  12/21/19 260 lb (117.9 kg)  07/15/19 230 lb 8 oz (104.6 kg)    BP (!) 154/100   Pulse 84   Temp (!) 96.5 F (35.8 C)   Resp (!) 24   Ht 5' 10.5" (1.791 m)   Wt 240 lb 8 oz (109.1 kg)   SpO2 99%   BMI 34.02 kg/m    Physical Exam Constitutional:      General: She is not in acute distress.    Appearance: She is well-developed. She is not diaphoretic.  HENT:     Right Ear: External ear normal.     Left Ear: External ear normal.     Nose: Nose normal.  Eyes:     Conjunctiva/sclera: Conjunctivae normal.  Cardiovascular:     Rate and Rhythm: Normal rate.  Pulmonary:     Effort: Pulmonary effort is normal.  Musculoskeletal:     Cervical back: Neck supple.     Comments: Uncomfortable with movement. Avoids weight bearing on the knee.  Right Knee - swelling  - extremely tender to light touch along the joint  space medial >>lateral - ROM around 110 - 130, beyond is avoid due to significant pain.  - no erythema - ligament testing deferred due to acute pain and guarding Back: TTP along the spine and left paraspinous muscles Normal strength on the left leg and normal ROM/sensation Deferred full exam due to severe knee pain  Skin:    General: Skin is warm and dry.     Capillary Refill: Capillary refill takes less than 2 seconds.  Neurological:     Mental Status: She is alert. Mental status is at baseline.  Psychiatric:        Mood and Affect: Mood normal.        Behavior: Behavior normal.           Assessment & Plan:   Problem List Items Addressed This Visit      Other   Acute pain of right knee - Primary    Pt with hx of meniscus tear requiring surgery now with acute knee pain unable to ambulate. No specific injury so did not get XR today, but given  surgical hx referral to ortho for further evaluation and treatment.       Relevant Orders   Ambulatory referral to Orthopedic Surgery   Acute left-sided low back pain without sciatica    Suspect low back strain. Pt avoids NSAIDs due to chronic medication condition. Cont tylenol, flexeril prn. Discussed importance of stretching but given severity of knee pain will focus on treating that before back pain.       Relevant Medications   cyclobenzaprine (FLEXERIL) 5 MG tablet       Return if symptoms worsen or fail to improve.  [name], MD

## 2019-12-29 NOTE — Patient Instructions (Addendum)
1) Knee Pain  - urgent referral back to your previous ortho doctor   2) Back pain - move as much as able - tylenol as needed  - flexeril for nighttime - may make you sleepy - if worsening or no improvement - return once knee pain improved

## 2019-12-30 DIAGNOSIS — M25561 Pain in right knee: Secondary | ICD-10-CM | POA: Insufficient documentation

## 2019-12-30 DIAGNOSIS — M545 Low back pain, unspecified: Secondary | ICD-10-CM | POA: Insufficient documentation

## 2019-12-30 NOTE — Telephone Encounter (Signed)
PA approved, pharmacy notified.

## 2020-01-18 ENCOUNTER — Other Ambulatory Visit: Payer: Self-pay | Admitting: Gastroenterology

## 2020-02-02 IMAGING — CT CT NECK W/ CM
3 of 5 series · 12 of 33 positions shown, 14 images · IV contrast (omnipaque)
Comparison: Head CT without contrast 7877 hours today. Brain MRI
08/23/2018, Cervical spine MRI 10/05/2017. Chest CTA 08/22/2018.

CLINICAL DATA: 67-year-old female with left side jaw swelling,
difficulty swallowing, unexplained dysphagia. Headache. Left side
extremity numbness and tingling.

EXAM:
CT NECK WITH CONTRAST
TECHNIQUE: Multidetector CT imaging of the neck was performed using the
standard protocol following the bolus administration of intravenous
contrast.
CONTRAST:  60mL OMNIPAQUE IOHEXOL 300 MG/ML  SOLN

[Series 6: sag neck · sagittal · 0.39mm/px · 5 of 128 slices shown, 6 images]
[im 43/128  bone]
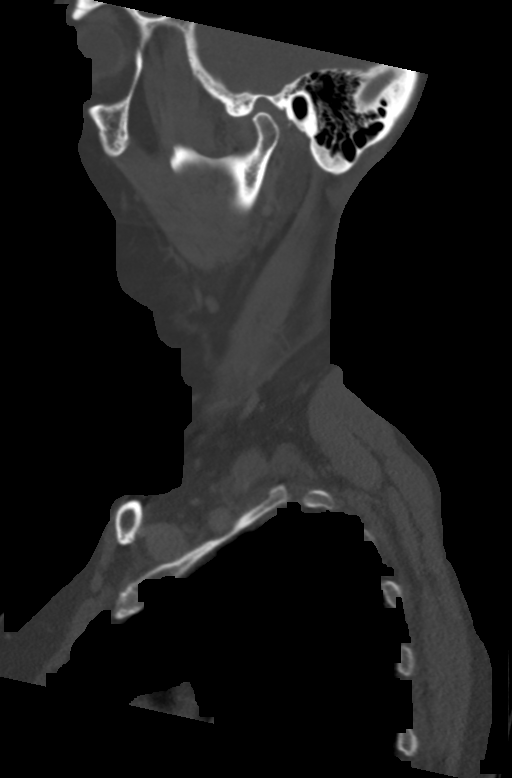
[im 53/128  bone]
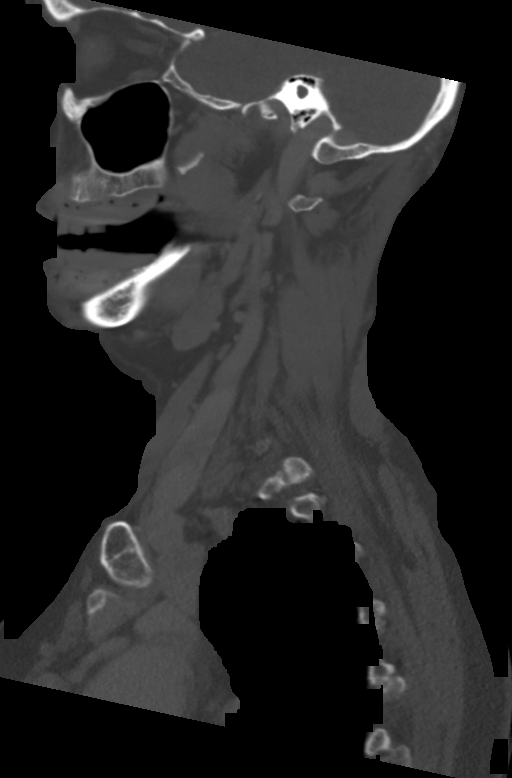
[im 64/128  soft-tissue]
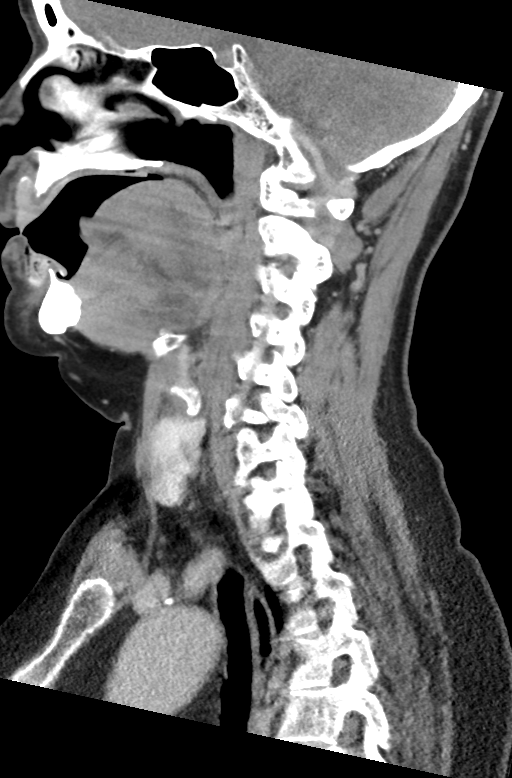
[im 64/128  bone]
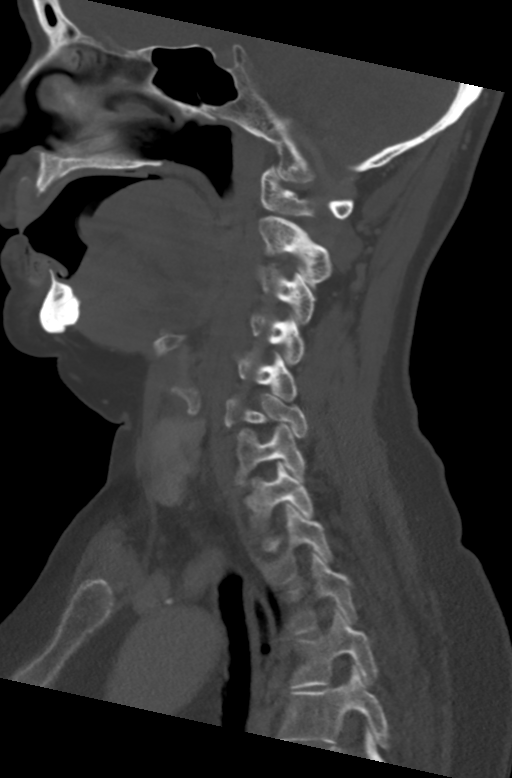
[im 75/128  bone]
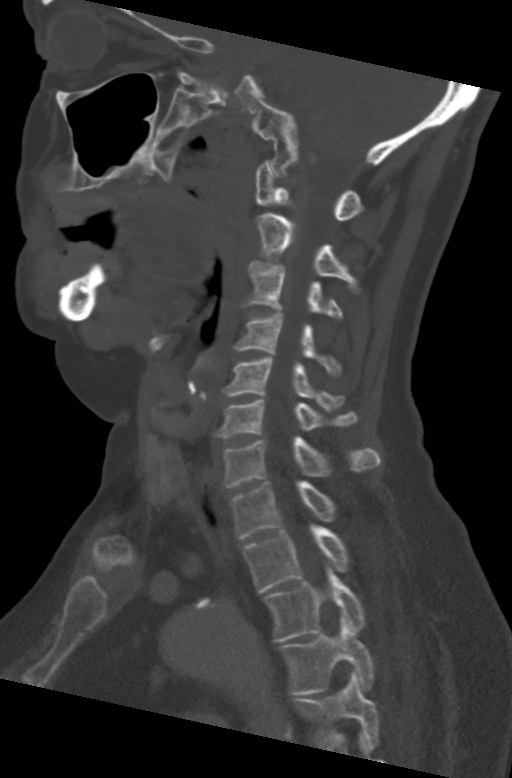
[im 85/128  bone]
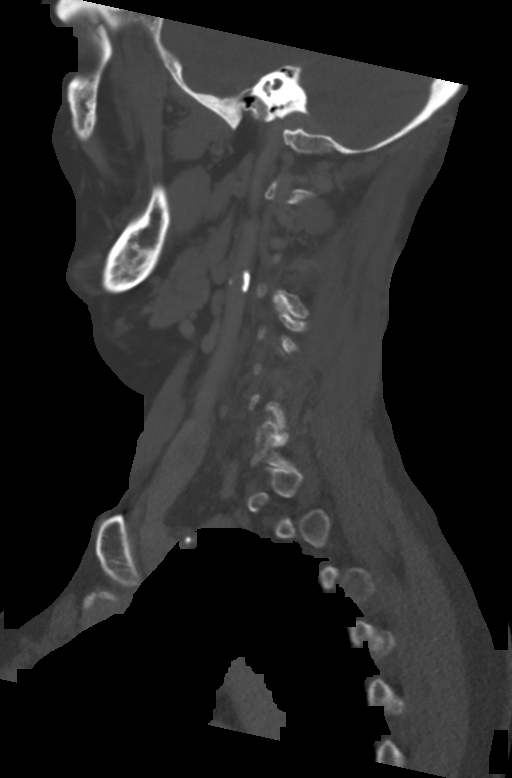

[Series 7: cor neck · coronal · 0.50mm/px · 3 of 107 slices shown]
[im 22/107  bone]
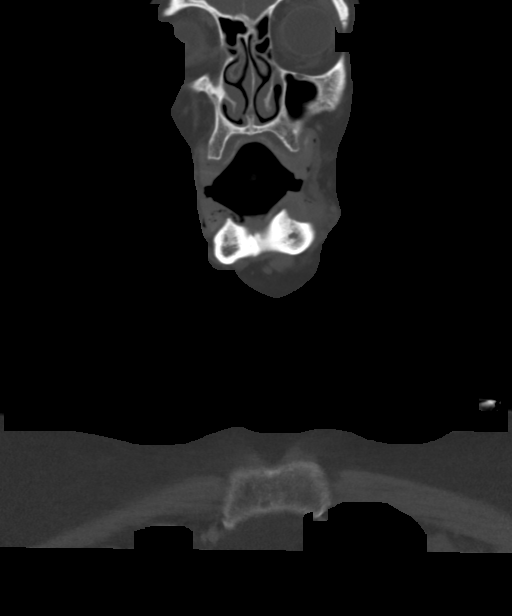
[im 43/107  bone]
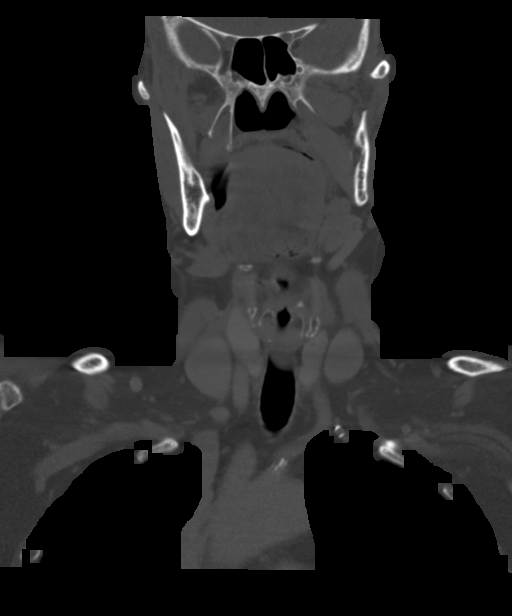
[im 64/107  bone]
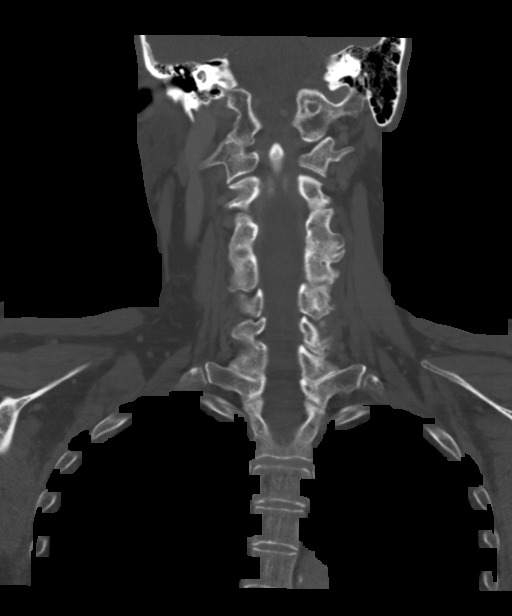

[Series 8: orthogonal ax · axial · 0.39mm/px · z∈[-326,-115]mm · 4 of 162 slices shown, 5 images]
[im 27/162  soft-tissue]
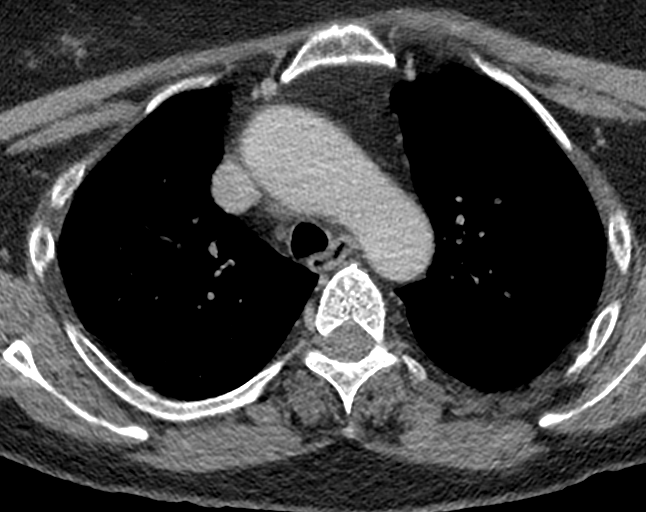
[im 27/162  bone]
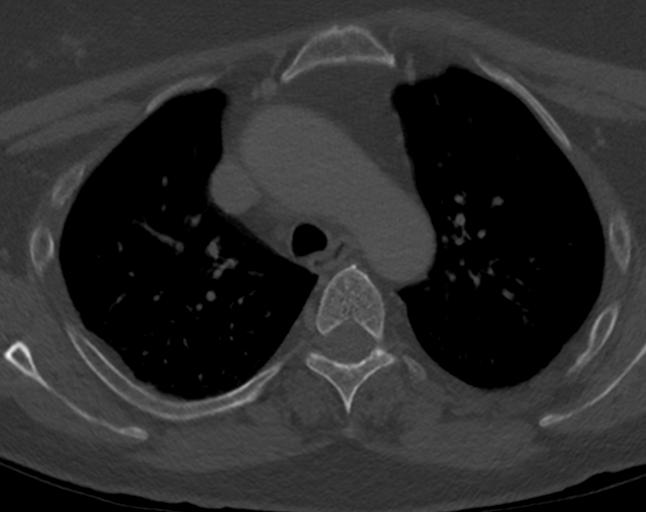
[im 54/162  bone]
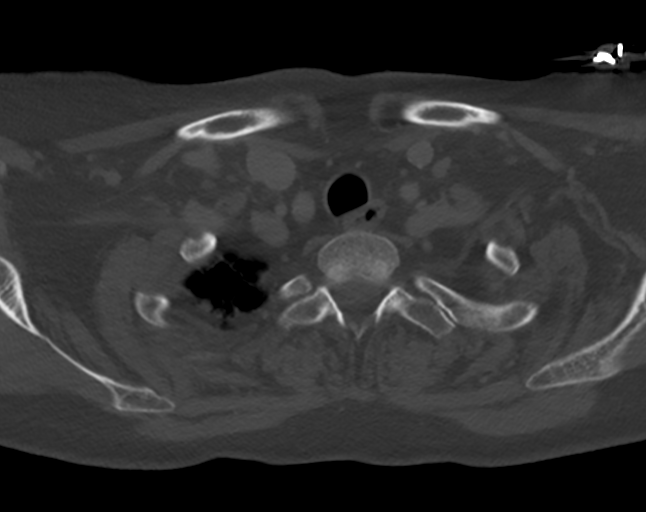
[im 108/162  bone]
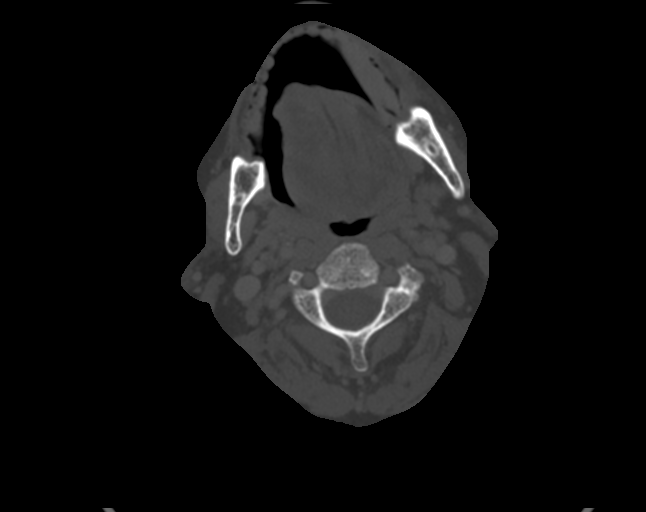
[im 135/162  bone]
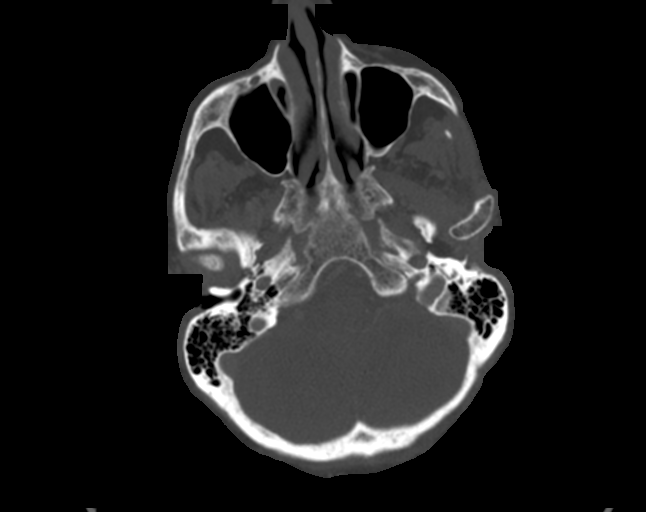

[12 of 33 positions shown; findings below may reference images not displayed]

FINDINGS: Pharynx and larynx: The glottis is closed and there is motion
artifact at the hypopharynx. No laryngeal or hypopharyngeal mass or
edema is evident. The epiglottis appears to remain normal.

Oropharynx and nasopharynx contours are within normal limits.
Negative parapharyngeal and retropharyngeal spaces.

Salivary glands: Sublingual space, submandibular glands, and parotid
glands are within normal limits.

Thyroid: Negative.

Lymph nodes: Negative. Bilateral cervical lymph nodes are within
normal limits.

Vascular: The major vascular structures in the neck and at the skull
base are patent. There is bulky calcified atherosclerosis at both
ICA origins. Superimposed bilateral ICA siphon calcified plaque.

Limited intracranial: Negative.

Visualized orbits: Negative.

Mastoids and visualized paranasal sinuses: Paranasal sinuses and
mastoids are stable and clear.

Skeleton: Absent dentition. Intact mandible. Congenital C6 spina
bifida occulta and additional dysplasia of the right C6 posterior
elements affecting the right C5-C6 and C6-C7 facets (sagittal image
62). This is much more apparent by CT than on the prior MRI, of the
cervical spine is stable. No acute osseous abnormality identified.

Upper chest: Calcified aortic atherosclerosis. No superior
mediastinal lymphadenopathy. Abnormal lung apices are stable from
the Roberto CTA and appear to reflect chronic scarring. Negative
trachea and visible esophagus.

Other: No asymmetric or convincing soft tissue inflammation is
identified about the mouth or jaw.
IMPRESSION: 1. No acute or inflammatory process identified in the neck or upper
chest.
2. Calcified carotid atherosclerosis.
3. C6 spina bifida occulta and right facet dysplasia.

## 2020-02-02 IMAGING — CT CT HEAD W/O CM
3 series · 16 of 45 positions shown, 19 images · non-contrast
Comparison: Head CT August 22, 2018 and brain MRI August 23, 2018

CLINICAL DATA: Left sided numbness. Left facial swelling. Headache.

EXAM:
CT HEAD WITHOUT CONTRAST
TECHNIQUE: Contiguous axial images were obtained from the base of the skull
through the vertex without intravenous contrast.

[Series 3: head wo · axial · 0.42mm/px · z∈[+310,+425]mm · 10 of 28 slices shown, 13 images]
[im 3/28  brain]
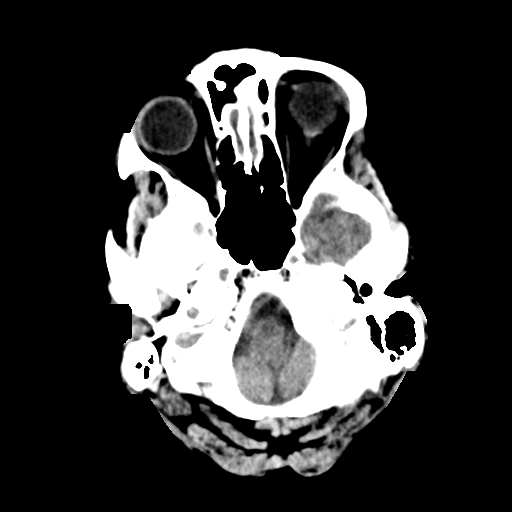
[im 3/28  bone]
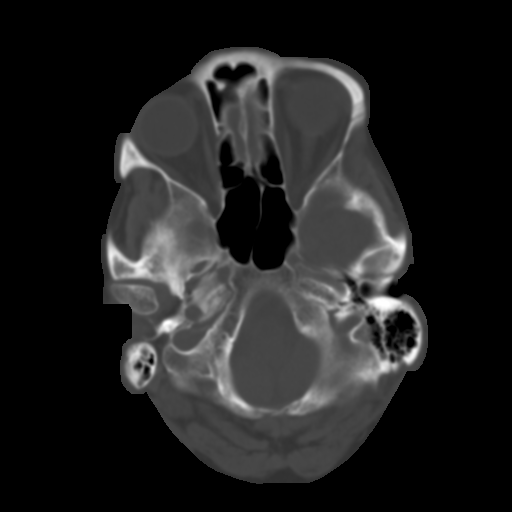
[im 5/28  brain]
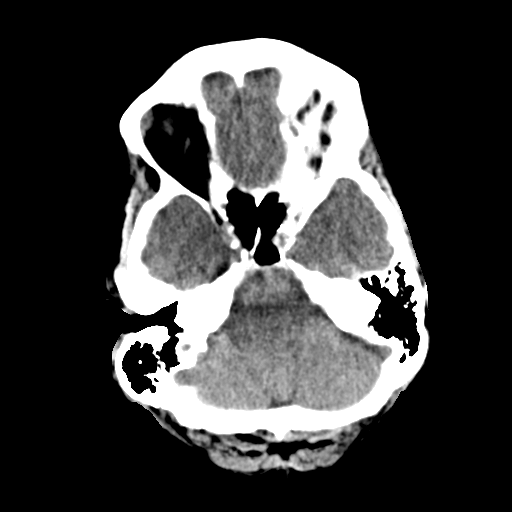
[im 8/28  brain]
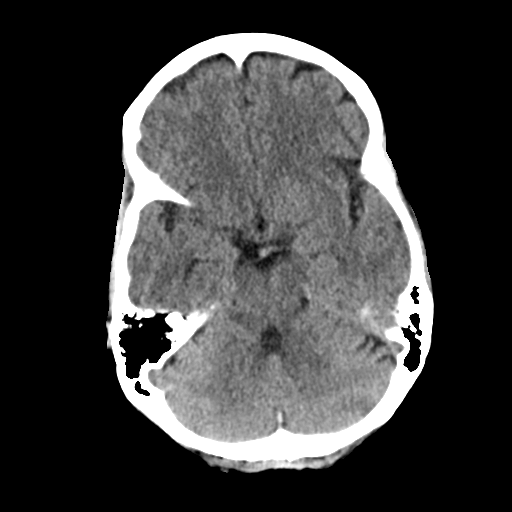
[im 11/28  brain]
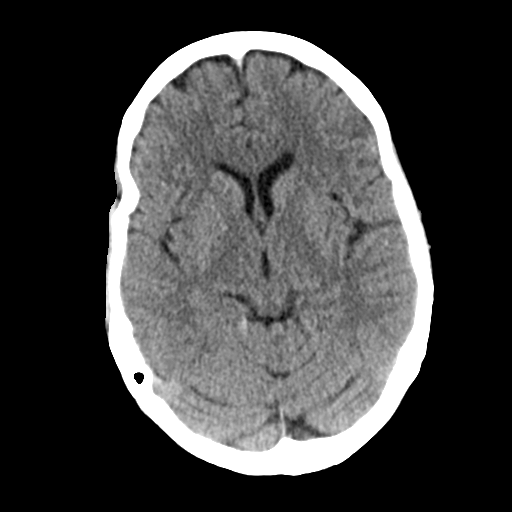
[im 13/28  brain]
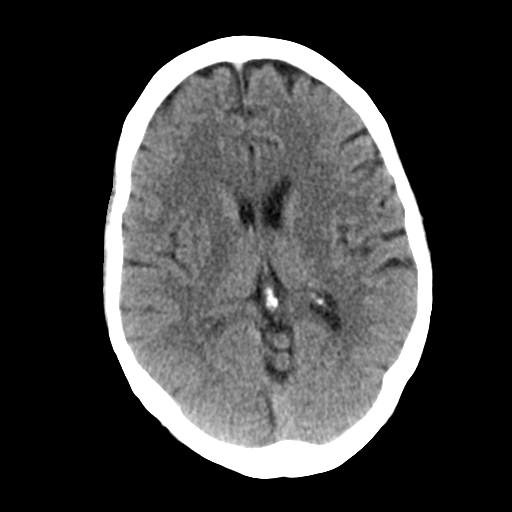
[im 13/28  bone]
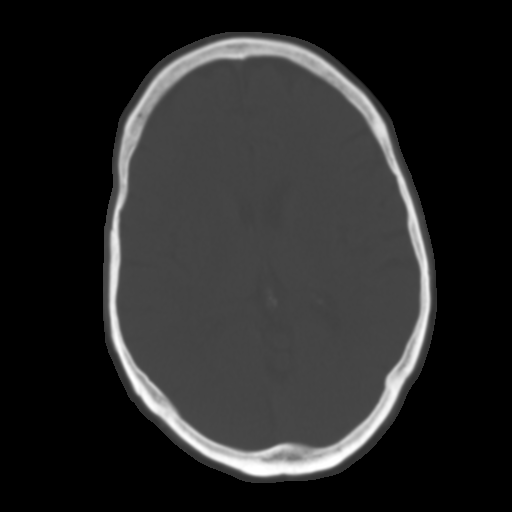
[im 16/28  brain]
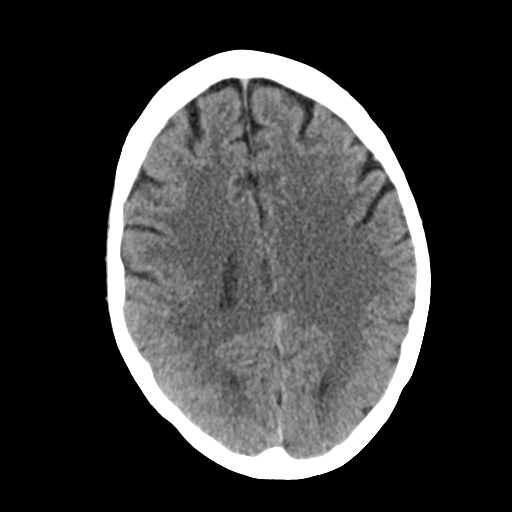
[im 18/28  brain]
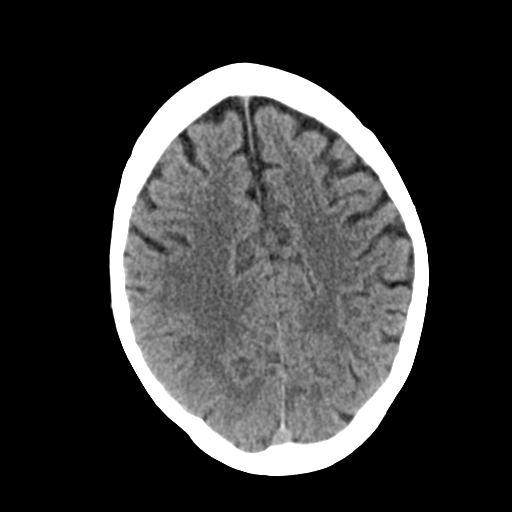
[im 21/28  brain]
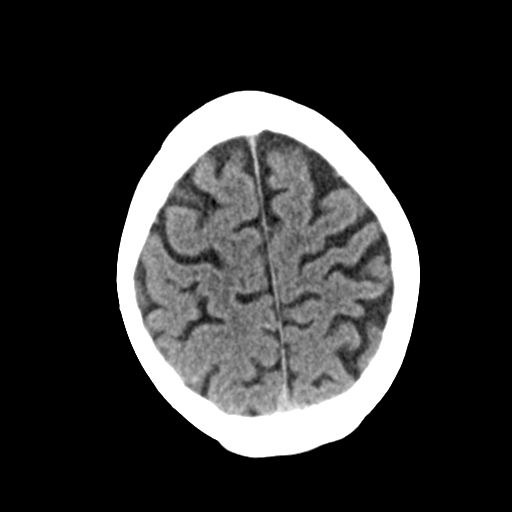
[im 24/28  brain]
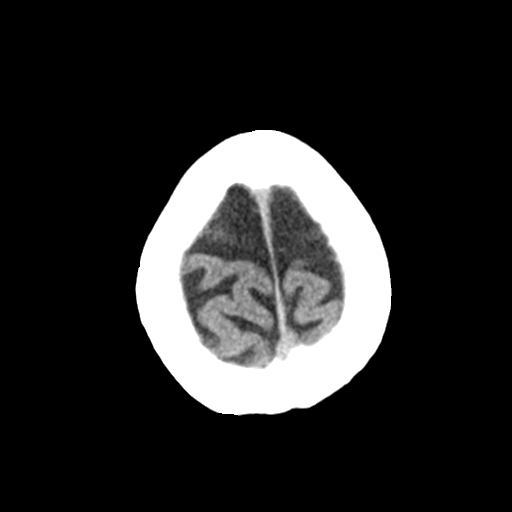
[im 24/28  bone]
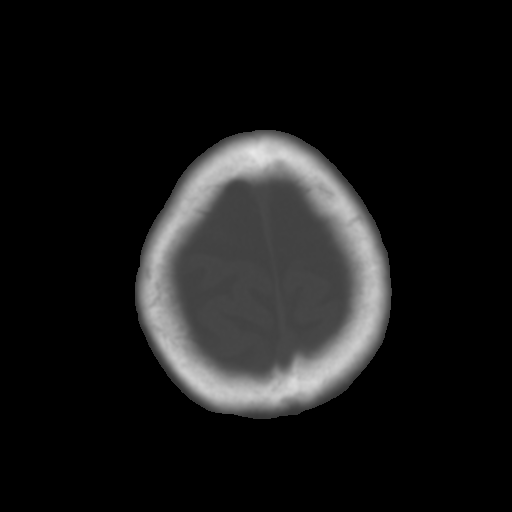
[im 26/28  brain]
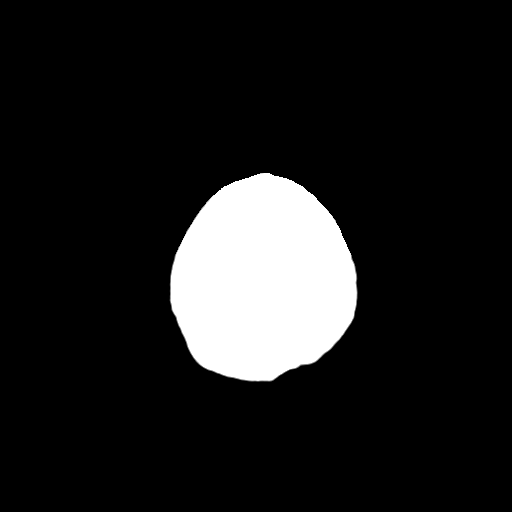

[Series 4: coronal soft tissue · coronal · 0.29mm/px · 3 of 64 slices shown]
[im 22/64  brain]
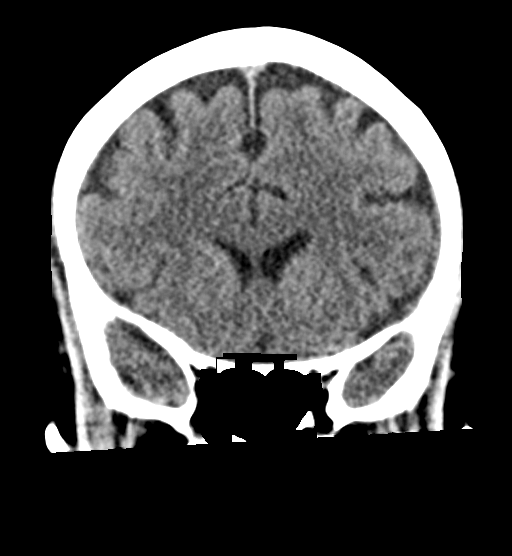
[im 29/64  brain]
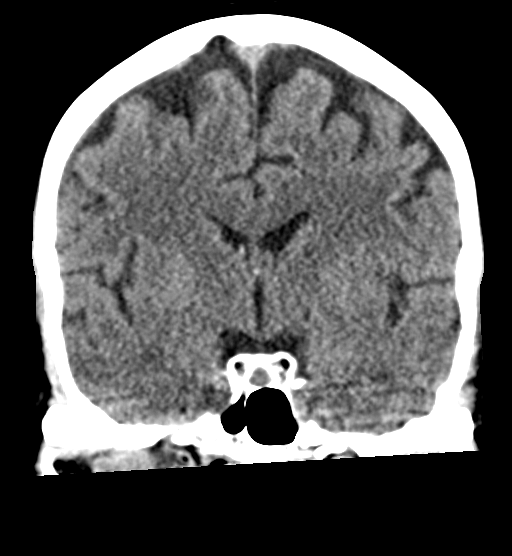
[im 36/64  brain]
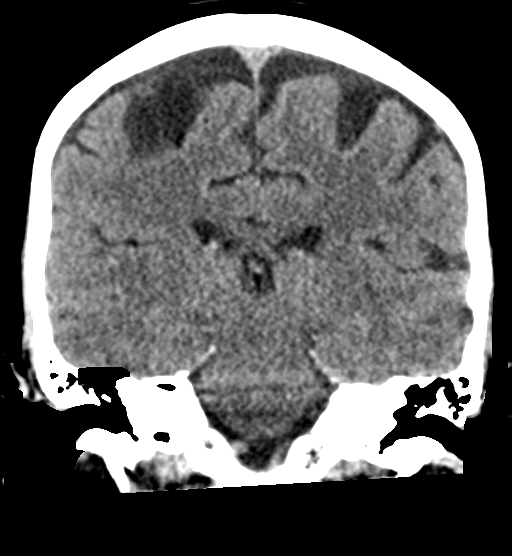

[Series 5: sagittal soft tissue · sagittal · 0.32mm/px · 3 of 50 slices shown]
[im 17/50  brain]
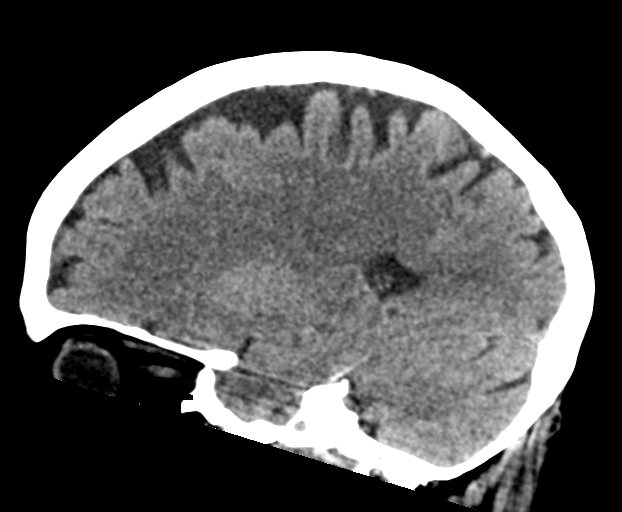
[im 25/50  brain]
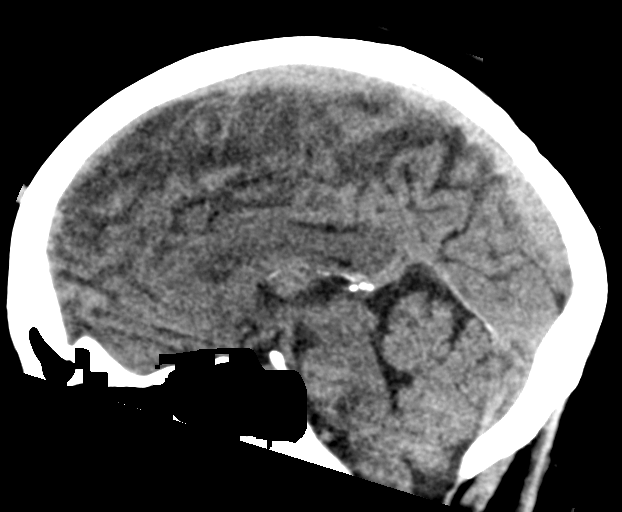
[im 33/50  brain]
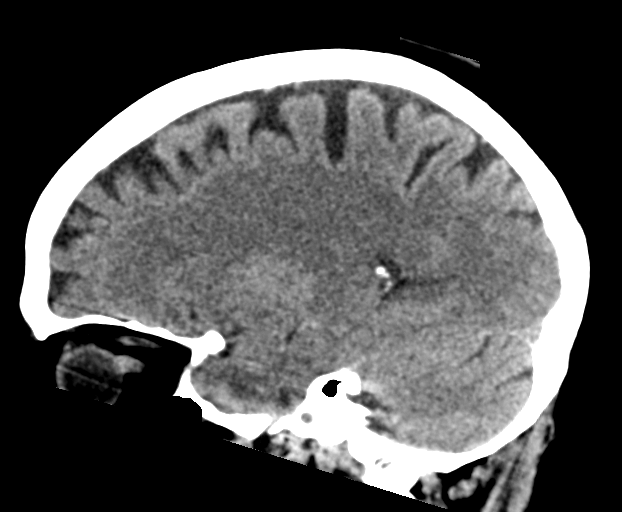

[16 of 45 positions shown; findings below may reference images not displayed]

FINDINGS: Brain: The ventricles are normal in size and configuration. There is
no intracranial mass, hemorrhage, extra-axial fluid collection, or
midline shift. The brain parenchyma appears unremarkable. No acute
infarct evident.

Vascular: No hyperdense vessel. There is calcification in each
carotid siphon region.

Skull: The bony calvarium appears intact.

Sinuses/Orbits: There is mucosal thickening in several ethmoid air
cells. Other visualized paranasal sinuses are clear. Visualized
orbits appear symmetric bilaterally.

Other: Visualized mastoid air cells are clear.
IMPRESSION: Brain parenchyma appears unremarkable. No mass or hemorrhage. Foci
of carotid artery calcification noted. Mucosal thickening noted in
several ethmoid air cells.

## 2020-02-05 ENCOUNTER — Telehealth: Payer: Self-pay

## 2020-02-05 DIAGNOSIS — J449 Chronic obstructive pulmonary disease, unspecified: Secondary | ICD-10-CM

## 2020-02-05 DIAGNOSIS — I509 Heart failure, unspecified: Secondary | ICD-10-CM

## 2020-02-05 NOTE — Telephone Encounter (Signed)
Patient called asking how she can get portable oxygen concentrator. I asked patient to call me back with company's name that she is using now for her home oxygen. Then would send this to Dr Einar Pheasant to get a RX approved, then I would send it to the company to see if patient is qualified. Patient understood and would call me back.

## 2020-02-21 ENCOUNTER — Other Ambulatory Visit: Payer: Self-pay | Admitting: Family Medicine

## 2020-03-09 ENCOUNTER — Other Ambulatory Visit: Payer: Self-pay

## 2020-03-09 ENCOUNTER — Other Ambulatory Visit: Payer: Self-pay | Admitting: Specialist

## 2020-03-09 ENCOUNTER — Ambulatory Visit
Admission: RE | Admit: 2020-03-09 | Discharge: 2020-03-09 | Disposition: A | Discharge status: Home / Self Care | Payer: Medicare HMO | Source: Ambulatory Visit | Attending: Specialist | Admitting: Specialist

## 2020-03-09 DIAGNOSIS — M79604 Pain in right leg: Secondary | ICD-10-CM | POA: Insufficient documentation

## 2020-03-16 MED ORDER — ESOMEPRAZOLE MAGNESIUM 40 MG PO CPDR: DELAYED_RELEASE_CAPSULE | ORAL | 1 refills | Status: DC

## 2020-03-16 NOTE — Addendum Note (Signed)
Addended by: [name] R on: 03/16/2020 02:03 PM   Modules accepted: Orders

## 2020-03-16 NOTE — Telephone Encounter (Addendum)
Pt left v/m returning call to Anastasiya CMA; pt uses [facility] and request new order for the [employer]ogen 1 air oxygen machine(oxygen reconstitution); pt also request to reinstate pts heart burn medication. Pt could not remember name of med.(on med list ? Nexium) pt request cb from Anastasiya. Pt last seen acute visit for knee and back pain 12/29/2019. I spoke with pt and the esomeprazole 40 mg is the med pt is requesting for burping but no CP. Pt said she has recently started back to burping and has been out of esomeprazole for some time. Last refilled on med list # 90 x 1 on 07/20/2019. [facility].

## 2020-03-16 NOTE — Addendum Note (Signed)
Addended by: Helene Shoe on: 03/16/2020 01:56 PM   Modules accepted: Orders

## 2020-03-16 NOTE — Telephone Encounter (Signed)
Placed DME order for [employer] oxygen as requested and sent community message to [place] representative to help getting this set up for the patient. FYI to PCP

## 2020-03-16 NOTE — Addendum Note (Signed)
Addended by: Kris Mouton on: 03/16/2020 04:29 PM   Modules accepted: Orders

## 2020-03-22 ENCOUNTER — Other Ambulatory Visit: Payer: Self-pay

## 2020-03-22 ENCOUNTER — Ambulatory Visit: Payer: Medicare HMO | Admitting: Family Medicine

## 2020-03-22 ENCOUNTER — Encounter: Payer: Self-pay | Admitting: Family Medicine

## 2020-03-22 VITALS — BP 112/88 | HR 74 | Temp 97.3°F | Resp 22 | Ht 70.5 in | Wt 243.5 lb

## 2020-03-22 DIAGNOSIS — I10 Essential (primary) hypertension: Secondary | ICD-10-CM

## 2020-03-22 DIAGNOSIS — R110 Nausea: Secondary | ICD-10-CM

## 2020-03-22 DIAGNOSIS — R112 Nausea with vomiting, unspecified: Secondary | ICD-10-CM | POA: Insufficient documentation

## 2020-03-22 DIAGNOSIS — D509 Iron deficiency anemia, unspecified: Secondary | ICD-10-CM

## 2020-03-22 DIAGNOSIS — R1310 Dysphagia, unspecified: Secondary | ICD-10-CM

## 2020-03-22 DIAGNOSIS — K279 Peptic ulcer, site unspecified, unspecified as acute or chronic, without hemorrhage or perforation: Secondary | ICD-10-CM

## 2020-03-22 LAB — CBC
HCT: 32.9 % — ABNORMAL LOW (ref 36.0–46.0)
Hemoglobin: 10.5 g/dL — ABNORMAL LOW (ref 12.0–15.0)
MCHC: 31.8 g/dL (ref 30.0–36.0)
MCV: 78.2 fl (ref 78.0–100.0)
Platelets: 328 10*3/uL (ref 150.0–400.0)
RBC: 4.21 Mil/uL (ref 3.87–5.11)
RDW: 16.2 % — ABNORMAL HIGH (ref 11.5–15.5)
WBC: 9.9 10*3/uL (ref 4.0–10.5)

## 2020-03-22 LAB — COMPREHENSIVE METABOLIC PANEL
ALT: 8 U/L (ref 0–35)
AST: 14 U/L (ref 0–37)
Albumin: 3.9 g/dL (ref 3.5–5.2)
Alkaline Phosphatase: 113 U/L (ref 39–117)
BUN: 17 mg/dL (ref 6–23)
CO2: 32 mEq/L (ref 19–32)
Calcium: 9.2 mg/dL (ref 8.4–10.5)
Chloride: 100 mEq/L (ref 96–112)
Creatinine, Ser: 1.05 mg/dL (ref 0.40–1.20)
GFR: 52.31 mL/min — ABNORMAL LOW (ref >60.00)
Glucose, Bld: 103 mg/dL — ABNORMAL HIGH (ref 70–99)
Potassium: 3.9 mEq/L (ref 3.5–5.1)
Sodium: 144 mEq/L (ref 135–145)
Total Bilirubin: 0.2 mg/dL (ref 0.2–1.2)
Total Protein: 6.4 g/dL (ref 6.0–8.3)

## 2020-03-22 LAB — IBC PANEL
Iron: 24 ug/dL — ABNORMAL LOW (ref 42–145)
Saturation Ratios: 6.4 % — ABNORMAL LOW (ref 20.0–50.0)
Transferrin: 269 mg/dL (ref 212.0–360.0)

## 2020-03-22 LAB — FERRITIN: Ferritin: 13.9 ng/mL (ref 10.0–291.0)

## 2020-03-22 LAB — H. PYLORI ANTIBODY, IGG: H Pylori IgG: NEGATIVE

## 2020-03-22 MED ORDER — PROMETHAZINE HCL 25 MG PO TABS: 25.0000 mg | ORAL_TABLET | Freq: Four times a day (QID) | ORAL | 0 refills | Status: DC | PRN

## 2020-03-22 NOTE — Assessment & Plan Note (Signed)
Notes hx of esophageal strictures and needing them dilated and now with 6 months of progressive swallowing difficulty with saliva. Is able to swallow food/water w/o difficulty. GI referral

## 2020-03-22 NOTE — Assessment & Plan Note (Signed)
BP normal. Cont meds

## 2020-03-22 NOTE — Assessment & Plan Note (Signed)
Endorses some dark stools which have improved. She is overdue for colon cancer screening and anemia has persisted in setting of iron supplement. Repeat labs today and urgent referral placed. If abdominal bloating does no improve will get CT depending on when GI appointment is made

## 2020-03-22 NOTE — Patient Instructions (Signed)
#  Swallowing Difficulty - referral to GI  #Nausea - anti nausea medication - labs - consider trying tums or antiacid   If your nausea does not improve or you notice worsening abdominal pain or symptoms call immediately. If it is not gone by Monday call and we will get a CT scan.

## 2020-03-22 NOTE — Assessment & Plan Note (Signed)
Hx of presence on EGD from 10 years ago but no follow-up. H pylori testing. Encouraged trying prn heartburn medication. On nexium 40 mg daily, but if some improvement with prn may increase to BID. F/u labs.

## 2020-03-22 NOTE — Assessment & Plan Note (Signed)
New onset and etiology unclear. Possible vaccine side effect vs peptic ulcer disease given hx. Labs today. Anti-nausea medication and GI referral given hx. If not improved by next week or worsening will likely get CT scan given persistent anemia and new bloating.

## 2020-03-22 NOTE — Progress Notes (Signed)
Subjective:     Morgan [name] is a 69 y.o. female presenting for Nausea (x 2 days. Stomach cramps with these episodes, some dysphagia issue off and on.)     HPI   #Nausea - endorses stomach cramps - watering in the mouth - also noticing some swallowing difficulty - used to only be with laying down and no is having issues when sitting  - swallowing difficulty x 6 months - is able to swallow food but is having issues more with salvia swallowing - relieved: breathing and working through the discomfort - symptoms last 45 minutes - dry heaves last night - today just consistent symptoms - no desire to eat - Covid shot on 4/2 and nausea started on 4/3 - taking nexium daily - has not tried any medications for the symptoms - noticing abdominal fullness and swelling recently  Endorses dark stools no blood in the stools - thought was related to vitamin "alive" and they are not as dark  Endorses occasional SOB.  Recent DVT rule out due to unilateral swelling   Review of Systems  Constitutional: Positive for chills (w/ the nausea).     Social History   Tobacco Use  Smoking Status Former Smoker  . Packs/day: 1.00  . Years: 15.00  . Pack years: 15.00  . Types: Cigarettes  . Quit date: 09/12/2017  . Years since quitting: 2.5  Smokeless Tobacco Never Used        Objective:    BP Readings from Last 3 Encounters:  03/22/20 112/88  12/29/19 (!) 154/100  12/21/19 (!) 193/87   Wt Readings from Last 3 Encounters:  03/22/20 243 lb 8 oz (110.5 kg)  12/29/19 240 lb 8 oz (109.1 kg)  12/21/19 260 lb (117.9 kg)    BP 112/88   Pulse 74   Temp (!) 97.3 F (36.3 C)   Resp (!) 22   Ht 5' 10.5" (1.791 m)   Wt 243 lb 8 oz (110.5 kg)   SpO2 98%   BMI 34.44 kg/m    Physical Exam Constitutional:      General: She is not in acute distress.    Appearance: She is well-developed. She is not diaphoretic.  HENT:     Right Ear: External ear normal.     Left Ear: External ear  normal.     Nose: Nose normal.  Eyes:     Conjunctiva/sclera: Conjunctivae normal.  Cardiovascular:     Rate and Rhythm: Normal rate and regular rhythm.     Heart sounds: No murmur.  Pulmonary:     Effort: Pulmonary effort is normal. No respiratory distress.     Breath sounds: Normal breath sounds. No wheezing.  Abdominal:     General: Bowel sounds are normal. There is distension.     Palpations: Abdomen is soft.     Tenderness: There is no abdominal tenderness. There is no guarding or rebound.  Musculoskeletal:     Cervical back: Neck supple.  Skin:    General: Skin is warm and dry.     Capillary Refill: Capillary refill takes less than 2 seconds.  Neurological:     Mental Status: She is alert. Mental status is at baseline.     Comments: Ambulating with crutch  Psychiatric:        Mood and Affect: Mood normal.        Behavior: Behavior normal.           Assessment & Plan:   Problem List Items  Addressed This Visit      Cardiovascular and Mediastinum   Essential hypertension    BP normal. Cont meds      Relevant Medications   aspirin 81 MG EC tablet   Other Relevant Orders   Comprehensive metabolic panel     Digestive   Dysphagia    Notes hx of esophageal strictures and needing them dilated and now with 6 months of progressive swallowing difficulty with saliva. Is able to swallow food/water w/o difficulty. GI referral      Relevant Orders   Ambulatory referral to Gastroenterology   H. pylori antibody, IgG   Peptic ulcer disease    Hx of presence on EGD from 10 years ago but no follow-up. H pylori testing. Encouraged trying prn heartburn medication. On nexium 40 mg daily, but if some improvement with prn may increase to BID. F/u labs.       Relevant Orders   Ambulatory referral to Gastroenterology   H. pylori antibody, IgG     Other   Nausea - Primary    New onset and etiology unclear. Possible vaccine side effect vs peptic ulcer disease given hx. Labs  today. Anti-nausea medication and GI referral given hx. If not improved by next week or worsening will likely get CT scan given persistent anemia and new bloating.       Relevant Medications   promethazine (PHENERGAN) 25 MG tablet   Other Relevant Orders   Ambulatory referral to Gastroenterology   H. pylori antibody, IgG   Iron deficiency anemia    Endorses some dark stools which have improved. She is overdue for colon cancer screening and anemia has persisted in setting of iron supplement. Repeat labs today and urgent referral placed. If abdominal bloating does no improve will get CT depending on when GI appointment is made      Relevant Orders   CBC   Ferritin   IBC panel       Return if symptoms worsen or fail to improve.  [name], MD

## 2020-03-28 ENCOUNTER — Ambulatory Visit
Admission: RE | Admit: 2020-03-28 | Discharge: 2020-03-28 | Disposition: A | Discharge status: Home / Self Care | Payer: Medicare HMO | Source: Ambulatory Visit | Attending: Family Medicine | Admitting: Family Medicine

## 2020-03-28 ENCOUNTER — Telehealth: Payer: Self-pay | Admitting: *Deleted

## 2020-03-28 ENCOUNTER — Other Ambulatory Visit: Payer: Self-pay

## 2020-03-28 DIAGNOSIS — I700 Atherosclerosis of aorta: Secondary | ICD-10-CM | POA: Insufficient documentation

## 2020-03-28 DIAGNOSIS — R112 Nausea with vomiting, unspecified: Secondary | ICD-10-CM | POA: Diagnosis present

## 2020-03-28 DIAGNOSIS — K8020 Calculus of gallbladder without cholecystitis without obstruction: Secondary | ICD-10-CM | POA: Insufficient documentation

## 2020-03-28 DIAGNOSIS — K5730 Diverticulosis of large intestine without perforation or abscess without bleeding: Secondary | ICD-10-CM | POA: Diagnosis not present

## 2020-03-28 MED ORDER — IOHEXOL 300 MG/ML  SOLN
125.0000 mL | Freq: Once | INTRAMUSCULAR | Status: AC | PRN
  Administered 2020-03-28: 17:00:00 125 mL via INTRAVENOUS

## 2020-03-28 NOTE — Telephone Encounter (Signed)
Patient called stating that she was in recently because of nausea. Patient stated that she is still having nausea, stomach cramps and dry heaves. Patient stated that she was advised to call back if her nausea continued and it has. Pharmacy [facility]

## 2020-03-28 NOTE — Telephone Encounter (Signed)
Will plan to order stat CT abdomen to further evaluate.   Please call patient to see how she is doing with drinking fluids.

## 2020-03-28 NOTE — Telephone Encounter (Signed)
Patient advised. Patient states she is going to PT now and has an appointment after that but would be available for CT after 12 pm today. She has not had any food today yet. I advised patient to not eat. She has been drinking some lemonade today. She is aware that she would have to drink 2 bottles of contrast for CT and she was ok with that. She prefers [employer] location.

## 2020-03-30 ENCOUNTER — Ambulatory Visit: Payer: Medicare HMO | Admitting: Physician Assistant

## 2020-03-30 ENCOUNTER — Encounter: Payer: Self-pay | Admitting: Physician Assistant

## 2020-03-30 VITALS — BP 150/82 | HR 86 | Temp 98.1°F | Ht 73.0 in | Wt 244.0 lb

## 2020-03-30 DIAGNOSIS — R110 Nausea: Secondary | ICD-10-CM | POA: Diagnosis not present

## 2020-03-30 DIAGNOSIS — Z01818 Encounter for other preprocedural examination: Secondary | ICD-10-CM

## 2020-03-30 DIAGNOSIS — K8020 Calculus of gallbladder without cholecystitis without obstruction: Secondary | ICD-10-CM

## 2020-03-30 DIAGNOSIS — R6881 Early satiety: Secondary | ICD-10-CM

## 2020-03-30 DIAGNOSIS — R1310 Dysphagia, unspecified: Secondary | ICD-10-CM

## 2020-03-30 DIAGNOSIS — K219 Gastro-esophageal reflux disease without esophagitis: Secondary | ICD-10-CM

## 2020-03-30 DIAGNOSIS — Z9981 Dependence on supplemental oxygen: Secondary | ICD-10-CM | POA: Insufficient documentation

## 2020-03-30 MED ORDER — ESOMEPRAZOLE MAGNESIUM 40 MG PO CPDR: DELAYED_RELEASE_CAPSULE | ORAL | 6 refills | Status: DC

## 2020-03-30 MED ORDER — PROMETHAZINE HCL 25 MG PO TABS: 25.0000 mg | ORAL_TABLET | Freq: Four times a day (QID) | ORAL | 2 refills | Status: DC | PRN

## 2020-03-30 NOTE — Progress Notes (Signed)
Subjective:    Patient ID: Morgan [name], female    DOB: 05-15-1951, 69 y.o.   MRN: [id number]  HPI Morgan [name] is a pleasant 69 year old white female, new to GI today referred by Morgan Schooner, MD with complaints of nausea dysphagia and prior history of peptic ulcer disease. Patient is known remotely to Dr. Delfin [name] last seen here in 2009.  She had EGD in 2009 which showed a partial gastric outlet obstruction with a fixed stenosis at the pylorus/antrum with lumen of 8 mm.  She subsequently had upper GI which showed fixed narrowing of the pylorus/antrum and bulb felt to be secondary to scarring from peptic ulcer disease.  Gastric emptying scan at that time was normal. Colonoscopy 2009 pertinent for internal hemorrhoids and left-sided diverticulosis, no polyps. Patient has history of hypertension, congestive heart failure, COPD for which she uses oxygen at bedtime, chronic kidney disease stage III.  Last EF 55 to 60%.  She is also had prior CVA.  She says she has been on Nexium 40 mg daily long-term.  She has had gradual progression of solid food dysphagia over the past several months.  Generally she does not have any difficulty with liquids but says sometimes she will have difficulty swallowing saliva which she attributes to prior CVA.  She is having fairly frequent episodes of solid food dysphagia with sense of food sticking "" in her esophagus.  She says this will be uncomfortable.  Has not had any episodes requiring regurgitation.  She is also developed a bit of hoarseness and raspiness in her voice over the past few months and has had nausea and queasiness without vomiting over the past few months as well.  She endorses early satiety, says she feels full and bloated many hours after eating.  She is not using any regular NSAIDs, no aspirin and no blood thinners. She did have CT of the abdomen pelvis done on 03/28/2020 which showed a calcified gallstone near the neck of the gallbladder no gallbladder wall  thickening, noted diverticulosis. Labs on 03/22/2020 WBC of 9.9 hemoglobin 10.5 hematocrit 32.9 ferritin 13.9/serum iron 24/TIBC 269/iron saturation 6.4  Review of Systems Pertinent positive and negative review of systems were noted in the above HPI section.  All other review of systems was otherwise negative.  Outpatient Encounter Medications as of 03/30/2020  Medication Sig  . acetaminophen (TYLENOL) 500 MG tablet Take 1,000 mg by mouth every 6 (six) hours as needed (pain).   Marland Kitchen albuterol (VENTOLIN HFA) 108 (90 Base) MCG/ACT inhaler TAKE 2 PUFFS BY MOUTH EVERY 6 HOURS AS NEEDED FOR WHEEZE OR SHORTNESS OF BREATH  . aspirin 81 MG EC tablet Take by mouth. daily  . atorvastatin (LIPITOR) 40 MG tablet Take 40 mg by mouth at bedtime.  . colestipol (COLESTID) 1 g tablet TAKE 1 TABLET (1 G TOTAL) BY MOUTH 2 (TWO) TIMES DAILY.  . cyclobenzaprine (FLEXERIL) 5 MG tablet Take 1 tablet (5 mg total) by mouth 3 (three) times daily as needed for muscle spasms.  Marland Kitchen esomeprazole (NEXIUM) 40 MG capsule Take 1 capsule by mouth twice daily.  . Ferrous Sulfate (IRON) 325 (65 Fe) MG TABS Take 1 tablet (325 mg total) by mouth daily.  . furosemide (LASIX) 40 MG tablet HOLD UNTIL seen by your doctor (Patient taking differently: Take 40 mg by mouth daily. )  . gabapentin (NEURONTIN) 400 MG capsule Take 400 mg by mouth 3 (three) times daily.   . mirtazapine (REMERON) 45 MG tablet Take 45 mg by  mouth at bedtime.   . promethazine (PHENERGAN) 25 MG tablet Take 1 tablet (25 mg total) by mouth every 6 (six) hours as needed for nausea or vomiting.  . [DISCONTINUED] esomeprazole (NEXIUM) 40 MG capsule TAKE 1 CAPSULE (40 MG TOTAL) BY MOUTH DAILY AT 12 NOON.  . [DISCONTINUED] promethazine (PHENERGAN) 25 MG tablet Take 1 tablet (25 mg total) by mouth every 6 (six) hours as needed for nausea or vomiting.   No facility-administered encounter medications on file as of 03/30/2020.   Allergies  Allergen Reactions  . Demerol [Meperidine]  Hives  . Strawberry (Diagnostic) Hives   Patient Active Problem List   Diagnosis Date Noted  . Oxygen dependent 03/30/2020  . Nausea 03/22/2020  . Peptic ulcer disease 03/22/2020  . Iron deficiency anemia 03/22/2020  . Acute pain of right knee 12/29/2019  . Acute left-sided low back pain without sciatica 12/29/2019  . Fall 04/08/2019  . Abdominal pain 02/19/2019  . Viral gastroenteritis 02/19/2019  . Asymptomatic bacteriuria 02/19/2019  . COPD exacerbation ([place]) 02/16/2019  . CKD (chronic kidney disease) stage 3, GFR 30-59 ml/min 01/13/2019  . CHF (congestive heart failure) ([place]) 01/13/2019  . Dizziness 01/13/2019  . Hoarseness 11/12/2018  . Dysphagia 11/12/2018  . Sleepiness 11/12/2018  . CVA (cerebral vascular accident) ([place]) 08/22/2018  . Left-sided weakness 10/04/2017  . HLD (hyperlipidemia) 10/04/2017  . GERD (gastroesophageal reflux disease) 10/04/2017  . Anxiety 08/26/2017  . Essential hypertension   . Depression   . Diarrhea 02/22/2015  . Hypokalemia 02/22/2015  . Chronic lower back pain 04/29/2013  . Paresthesia 04/29/2013   Social History   Socioeconomic History  . Marital status: Single    Spouse name: Not on file  . Number of children: 2  . Years of education: Not on file  . Highest education level: Not on file  Occupational History  . Not on file  Tobacco Use  . Smoking status: Former Smoker    Packs/day: 1.00    Years: 15.00    Pack years: 15.00    Types: Cigarettes    Quit date: 09/12/2017    Years since quitting: 2.5  . Smokeless tobacco: Never Used  Substance and Sexual Activity  . Alcohol use: Not Currently  . Drug use: No  . Sexual activity: Not Currently  Other Topics Concern  . 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 Difficulty of Paying Living Expenses:   Food Insecurity:   . Worried About Charity fundraiser in the Last Year:   . Arboriculturist in the Last Year:   Transportation Needs:   . Film/video editor (Medical):   Marland Kitchen Lack of Transportation (Non-Medical):   Physical Activity:   . Days of Exercise per Week:   . Minutes of Exercise per Session:   Stress:   . Feeling of Stress :   Social Connections:   . Frequency of Communication with Friends and Family:   . Frequency  of Social Gatherings with Friends and Family:   . Attends Religious Services:   . Active Member of Clubs or Organizations:   . Attends Archivist Meetings:   Marland Kitchen Marital Status:   Intimate Partner Violence:   . Fear of Current or Ex-Partner:   . Emotionally Abused:   Marland Kitchen Physically Abused:   . Sexually Abused:     Ms. Eckstein's family history includes Brain cancer in her mother; Breast cancer (age of onset: 4) in her mother; Breast cancer (age of onset: 70) in her maternal aunt and maternal aunt; Breast cancer (age of onset: 64) in her paternal grandmother; Breast cancer (age of onset: 21) in her maternal grandmother; Cancer in her paternal grandfather; Diabetes in her paternal grandmother; Heart disease in her father; Hyperlipidemia in her father; Hypertension in her father; Ovarian cancer (age of onset: 61) in her mother.      Objective:    Vitals:   03/30/20 1346  BP: (!) 150/82  Pulse: 86  Temp: 98.1 F (36.7 C)    Physical Exam Well-developed well-nourished femalein no acute distress.  Height, [id number] BMI 32.1  HEENT; nontraumatic normocephalic, EOMI, PER R LA, sclera anicteric. 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  I am concerned that she may have recurrent peptic ulcer disease and or recurrent partial gastric outlet obstruction with secondary increase in GERD symptoms.  #2 solid food dysphagia, rule out esophageal stricture, versus motility issues. #3 diverticulosis 4.  Hypertension 5.  Prior history of CVA 6.  COPD on nocturnal O2 7.  Congestive heart failure with EF 55% 8.  Chronic kidney disease stage III  Plan increase Nexium to 40 mg p.o. twice daily AC breakfast and AC dinner. Refill Phenergan 25 mg every 6 to 8 hours as needed for nausea. We discussed an antireflux regimen, and low residue with small frequent feedings. Will schedule patient for upper endoscopy with possible esophageal dilation with Dr. Silverio Decamp.  Due to nocturnal oxygen use this will need to be scheduled at the hospital.  Procedure was discussed in detail with the patient including indications risks benefits and she is agreeable to proceed. She is also due for colonoscopy.  She would like to get her upper GI symptoms sorted out prior to  consideration of colonoscopy.    Isiah Scheel Genia Harold PA-C 03/30/2020   Cc: [name], MD

## 2020-03-30 NOTE — H&P (View-Only) (Signed)
Subjective:    Patient ID: Morgan [name], female    DOB: 06-Oct-1951, 69 y.o.   MRN: [id number]  HPI Morgan [name] is a pleasant 69 year old white female, new to GI today referred by Morgan Schooner, MD with complaints of nausea dysphagia and prior history of peptic ulcer disease. Patient is known remotely to Morgan [name] last seen here in 2009.  She had EGD in 2009 which showed a partial gastric outlet obstruction with a fixed stenosis at the pylorus/antrum with lumen of 8 mm.  She subsequently had upper GI which showed fixed narrowing of the pylorus/antrum and bulb felt to be secondary to scarring from peptic ulcer disease.  Gastric emptying scan at that time was normal. Colonoscopy 2009 pertinent for internal hemorrhoids and left-sided diverticulosis, no polyps. Patient has history of hypertension, congestive heart failure, COPD for which she uses oxygen at bedtime, chronic kidney disease stage III.  Last EF 55 to 60%.  She is also had prior CVA.  She says she has been on Nexium 40 mg daily long-term.  She has had gradual progression of solid food dysphagia over the past several months.  Generally she does not have any difficulty with liquids but says sometimes she will have difficulty swallowing saliva which she attributes to prior CVA.  She is having fairly frequent episodes of solid food dysphagia with sense of food sticking "" in her esophagus.  She says this will be uncomfortable.  Has not had any episodes requiring regurgitation.  She is also developed a bit of hoarseness and raspiness in her voice over the past few months and has had nausea and queasiness without vomiting over the past few months as well.  She endorses early satiety, says she feels full and bloated many hours after eating.  She is not using any regular NSAIDs, no aspirin and no blood thinners. She did have CT of the abdomen pelvis done on 03/28/2020 which showed a calcified gallstone near the neck of the gallbladder no gallbladder wall  thickening, noted diverticulosis. Labs on 03/22/2020 WBC of 9.9 hemoglobin 10.5 hematocrit 32.9 ferritin 13.9/serum iron 24/TIBC 269/iron saturation 6.4  Review of Systems Pertinent positive and negative review of systems were noted in the above HPI section.  All other review of systems was otherwise negative.  Outpatient Encounter Medications as of 03/30/2020  Medication Sig  . acetaminophen (TYLENOL) 500 MG tablet Take 1,000 mg by mouth every 6 (six) hours as needed (pain).   Marland Kitchen albuterol (VENTOLIN HFA) 108 (90 Base) MCG/ACT inhaler TAKE 2 PUFFS BY MOUTH EVERY 6 HOURS AS NEEDED FOR WHEEZE OR SHORTNESS OF BREATH  . aspirin 81 MG EC tablet Take by mouth. daily  . atorvastatin (LIPITOR) 40 MG tablet Take 40 mg by mouth at bedtime.  . colestipol (COLESTID) 1 g tablet TAKE 1 TABLET (1 G TOTAL) BY MOUTH 2 (TWO) TIMES DAILY.  . cyclobenzaprine (FLEXERIL) 5 MG tablet Take 1 tablet (5 mg total) by mouth 3 (three) times daily as needed for muscle spasms.  Marland Kitchen esomeprazole (NEXIUM) 40 MG capsule Take 1 capsule by mouth twice daily.  . Ferrous Sulfate (IRON) 325 (65 Fe) MG TABS Take 1 tablet (325 mg total) by mouth daily.  . furosemide (LASIX) 40 MG tablet HOLD UNTIL seen by your doctor (Patient taking differently: Take 40 mg by mouth daily. )  . gabapentin (NEURONTIN) 400 MG capsule Take 400 mg by mouth 3 (three) times daily.   . mirtazapine (REMERON) 45 MG tablet Take 45 mg by  mouth at bedtime.   . promethazine (PHENERGAN) 25 MG tablet Take 1 tablet (25 mg total) by mouth every 6 (six) hours as needed for nausea or vomiting.  . [DISCONTINUED] esomeprazole (NEXIUM) 40 MG capsule TAKE 1 CAPSULE (40 MG TOTAL) BY MOUTH DAILY AT 12 NOON.  . [DISCONTINUED] promethazine (PHENERGAN) 25 MG tablet Take 1 tablet (25 mg total) by mouth every 6 (six) hours as needed for nausea or vomiting.   No facility-administered encounter medications on file as of 03/30/2020.   Allergies  Allergen Reactions  . Demerol [Meperidine]  Hives  . Strawberry (Diagnostic) Hives   Patient Active Problem List   Diagnosis Date Noted  . Oxygen dependent 03/30/2020  . Nausea 03/22/2020  . Peptic ulcer disease 03/22/2020  . Iron deficiency anemia 03/22/2020  . Acute pain of right knee 12/29/2019  . Acute left-sided low back pain without sciatica 12/29/2019  . Fall 04/08/2019  . Abdominal pain 02/19/2019  . Viral gastroenteritis 02/19/2019  . Asymptomatic bacteriuria 02/19/2019  . COPD exacerbation ([place]) 02/16/2019  . CKD (chronic kidney disease) stage 3, GFR 30-59 ml/min 01/13/2019  . CHF (congestive heart failure) ([place]) 01/13/2019  . Dizziness 01/13/2019  . Hoarseness 11/12/2018  . Dysphagia 11/12/2018  . Sleepiness 11/12/2018  . CVA (cerebral vascular accident) ([place]) 08/22/2018  . Left-sided weakness 10/04/2017  . HLD (hyperlipidemia) 10/04/2017  . GERD (gastroesophageal reflux disease) 10/04/2017  . Anxiety 08/26/2017  . Essential hypertension   . Depression   . Diarrhea 02/22/2015  . Hypokalemia 02/22/2015  . Chronic lower back pain 04/29/2013  . Paresthesia 04/29/2013   Social History   Socioeconomic History  . Marital status: Single    Spouse name: Not on file  . Number of children: 2  . Years of education: Not on file  . Highest education level: Not on file  Occupational History  . Not on file  Tobacco Use  . Smoking status: Former Smoker    Packs/day: 1.00    Years: 15.00    Pack years: 15.00    Types: Cigarettes    Quit date: 09/12/2017    Years since quitting: 2.5  . Smokeless tobacco: Never Used  Substance and Sexual Activity  . Alcohol use: Not Currently  . Drug use: No  . Sexual activity: Not Currently  Other Topics Concern  . 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 Difficulty of Paying Living Expenses:   Food Insecurity:   . Worried About Charity fundraiser in the Last Year:   . Arboriculturist in the Last Year:   Transportation Needs:   . Film/video editor (Medical):   Marland Kitchen Lack of Transportation (Non-Medical):   Physical Activity:   . Days of Exercise per Week:   . Minutes of Exercise per Session:   Stress:   . Feeling of Stress :   Social Connections:   . Frequency of Communication with Friends and Family:   . Frequency  of Social Gatherings with Friends and Family:   . Attends Religious Services:   . Active Member of Clubs or Organizations:   . Attends Archivist Meetings:   Marland Kitchen Marital Status:   Intimate Partner Violence:   . Fear of Current or Ex-Partner:   . Emotionally Abused:   Marland Kitchen Physically Abused:   . Sexually Abused:     Ms. Easton's family history includes Brain cancer in her mother; Breast cancer (age of onset: 44) in her mother; Breast cancer (age of onset: 38) in her maternal aunt and maternal aunt; Breast cancer (age of onset: 36) in her paternal grandmother; Breast cancer (age of onset: 83) in her maternal grandmother; Cancer in her paternal grandfather; Diabetes in her paternal grandmother; Heart disease in her father; Hyperlipidemia in her father; Hypertension in her father; Ovarian cancer (age of onset: 19) in her mother.      Objective:    Vitals:   03/30/20 1346  BP: (!) 150/82  Pulse: 86  Temp: 98.1 F (36.7 C)    Physical Exam Well-developed well-nourished femalein no acute distress.  Height, [id number] BMI 32.1  HEENT; nontraumatic normocephalic, EOMI, PER R LA, sclera anicteric. 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  I am concerned that she may have recurrent peptic ulcer disease and or recurrent partial gastric outlet obstruction with secondary increase in GERD symptoms.  #2 solid food dysphagia, rule out esophageal stricture, versus motility issues. #3 diverticulosis 4.  Hypertension 5.  Prior history of CVA 6.  COPD on nocturnal O2 7.  Congestive heart failure with EF 55% 8.  Chronic kidney disease stage III  Plan increase Nexium to 40 mg p.o. twice daily AC breakfast and AC dinner. Refill Phenergan 25 mg every 6 to 8 hours as needed for nausea. We discussed an antireflux regimen, and low residue with small frequent feedings. Will schedule patient for upper endoscopy with possible esophageal dilation with Dr. Silverio Decamp.  Due to nocturnal oxygen use this will need to be scheduled at the hospital.  Procedure was discussed in detail with the patient including indications risks benefits and she is agreeable to proceed. She is also due for colonoscopy.  She would like to get her upper GI symptoms sorted out prior to  consideration of colonoscopy.    Makahla Kiser Genia Harold PA-C 03/30/2020   Cc: [name], MD

## 2020-03-30 NOTE — Patient Instructions (Signed)
If you are age 67 or older, your body mass index should be between 23-30. Your Body mass index is 32.19 kg/m. If this is out of the aforementioned range listed, please consider follow up with your Primary Care Provider.  If you are age 52 or younger, your body mass index should be between 19-25. Your Body mass index is 32.19 kg/m. If this is out of the aformentioned range listed, please consider follow up with your Primary Care Provider.    We have sent the following medications to your pharmacy for you to pick up at your convenience: Nexium and Phenergan  I will call you with an appointment at the hospital for Endoscopy.  Increase your Nexium to 40mg  - twice daily.   Thank you for choosing me and [facility].  Amy Esterwood-PA

## 2020-03-31 ENCOUNTER — Other Ambulatory Visit: Payer: Self-pay

## 2020-03-31 ENCOUNTER — Telehealth: Payer: Self-pay | Admitting: Physician Assistant

## 2020-03-31 DIAGNOSIS — R110 Nausea: Secondary | ICD-10-CM

## 2020-03-31 DIAGNOSIS — R1310 Dysphagia, unspecified: Secondary | ICD-10-CM

## 2020-03-31 DIAGNOSIS — R6881 Early satiety: Secondary | ICD-10-CM

## 2020-03-31 DIAGNOSIS — K219 Gastro-esophageal reflux disease without esophagitis: Secondary | ICD-10-CM

## 2020-03-31 NOTE — Telephone Encounter (Signed)
Pt is scheduled for 04/21/20 @ 10:45am at [facility] for Endo w/ dil. Covid Test 04/18/20 @1 :30pm. Instructions given to pt over the phone as well as being mailed to pt. Pt voiced understanding and knows to call office with any questions.

## 2020-03-31 NOTE — Progress Notes (Signed)
Amb

## 2020-03-31 NOTE — Addendum Note (Signed)
Addended byDebbe Mounts on: 03/31/2020 04:55 PM   Modules accepted: Orders

## 2020-04-05 NOTE — Progress Notes (Signed)
Reviewed and agree with documentation and assessment and plan. K. Veena Rhiann Boucher , MD   

## 2020-04-07 ENCOUNTER — Encounter: Payer: Medicare HMO | Admitting: Gastroenterology

## 2020-04-18 ENCOUNTER — Other Ambulatory Visit (HOSPITAL_COMMUNITY)
Admission: RE | Admit: 2020-04-18 | Discharge: 2020-04-18 | Disposition: A | Discharge status: Home / Self Care | Payer: Medicare HMO | Source: Ambulatory Visit | Attending: Gastroenterology | Admitting: Gastroenterology

## 2020-04-18 DIAGNOSIS — Z01812 Encounter for preprocedural laboratory examination: Secondary | ICD-10-CM | POA: Insufficient documentation

## 2020-04-18 DIAGNOSIS — Z20822 Contact with and (suspected) exposure to covid-19: Secondary | ICD-10-CM | POA: Diagnosis not present

## 2020-04-18 LAB — SARS CORONAVIRUS 2 (TAT 6-24 HRS): SARS Coronavirus 2: NEGATIVE

## 2020-04-21 ENCOUNTER — Encounter (HOSPITAL_COMMUNITY)
Admission: RE | Disposition: A | Discharge status: Home / Self Care | Payer: Self-pay | Source: Home / Self Care | Attending: Gastroenterology

## 2020-04-21 ENCOUNTER — Other Ambulatory Visit: Payer: Self-pay

## 2020-04-21 ENCOUNTER — Ambulatory Visit (HOSPITAL_COMMUNITY): Payer: Medicare HMO | Admitting: Anesthesiology

## 2020-04-21 ENCOUNTER — Ambulatory Visit (HOSPITAL_COMMUNITY)
Admission: RE | Admit: 2020-04-21 | Discharge: 2020-04-21 | Disposition: A | Discharge status: Home / Self Care | Payer: Medicare HMO | Attending: Gastroenterology | Admitting: Gastroenterology

## 2020-04-21 ENCOUNTER — Telehealth: Payer: Self-pay

## 2020-04-21 ENCOUNTER — Encounter (HOSPITAL_COMMUNITY): Payer: Self-pay | Admitting: Gastroenterology

## 2020-04-21 DIAGNOSIS — I130 Hypertensive heart and chronic kidney disease with heart failure and stage 1 through stage 4 chronic kidney disease, or unspecified chronic kidney disease: Secondary | ICD-10-CM | POA: Insufficient documentation

## 2020-04-21 DIAGNOSIS — K259 Gastric ulcer, unspecified as acute or chronic, without hemorrhage or perforation: Secondary | ICD-10-CM | POA: Diagnosis not present

## 2020-04-21 DIAGNOSIS — M545 Low back pain: Secondary | ICD-10-CM | POA: Diagnosis not present

## 2020-04-21 DIAGNOSIS — R1310 Dysphagia, unspecified: Secondary | ICD-10-CM | POA: Insufficient documentation

## 2020-04-21 DIAGNOSIS — F419 Anxiety disorder, unspecified: Secondary | ICD-10-CM | POA: Diagnosis not present

## 2020-04-21 DIAGNOSIS — K311 Adult hypertrophic pyloric stenosis: Secondary | ICD-10-CM | POA: Diagnosis not present

## 2020-04-21 DIAGNOSIS — K319 Disease of stomach and duodenum, unspecified: Secondary | ICD-10-CM | POA: Diagnosis not present

## 2020-04-21 DIAGNOSIS — K30 Functional dyspepsia: Secondary | ICD-10-CM | POA: Diagnosis not present

## 2020-04-21 DIAGNOSIS — J449 Chronic obstructive pulmonary disease, unspecified: Secondary | ICD-10-CM | POA: Insufficient documentation

## 2020-04-21 DIAGNOSIS — Z9981 Dependence on supplemental oxygen: Secondary | ICD-10-CM | POA: Diagnosis not present

## 2020-04-21 DIAGNOSIS — G8929 Other chronic pain: Secondary | ICD-10-CM | POA: Insufficient documentation

## 2020-04-21 DIAGNOSIS — F329 Major depressive disorder, single episode, unspecified: Secondary | ICD-10-CM | POA: Diagnosis not present

## 2020-04-21 DIAGNOSIS — D509 Iron deficiency anemia, unspecified: Secondary | ICD-10-CM | POA: Diagnosis not present

## 2020-04-21 DIAGNOSIS — E876 Hypokalemia: Secondary | ICD-10-CM | POA: Diagnosis not present

## 2020-04-21 DIAGNOSIS — K648 Other hemorrhoids: Secondary | ICD-10-CM | POA: Insufficient documentation

## 2020-04-21 DIAGNOSIS — K219 Gastro-esophageal reflux disease without esophagitis: Secondary | ICD-10-CM | POA: Insufficient documentation

## 2020-04-21 DIAGNOSIS — Z87891 Personal history of nicotine dependence: Secondary | ICD-10-CM | POA: Insufficient documentation

## 2020-04-21 DIAGNOSIS — Z91018 Allergy to other foods: Secondary | ICD-10-CM | POA: Insufficient documentation

## 2020-04-21 DIAGNOSIS — K2970 Gastritis, unspecified, without bleeding: Secondary | ICD-10-CM | POA: Diagnosis not present

## 2020-04-21 DIAGNOSIS — I509 Heart failure, unspecified: Secondary | ICD-10-CM | POA: Insufficient documentation

## 2020-04-21 DIAGNOSIS — R6881 Early satiety: Secondary | ICD-10-CM

## 2020-04-21 DIAGNOSIS — Z79899 Other long term (current) drug therapy: Secondary | ICD-10-CM | POA: Insufficient documentation

## 2020-04-21 DIAGNOSIS — E785 Hyperlipidemia, unspecified: Secondary | ICD-10-CM | POA: Diagnosis not present

## 2020-04-21 DIAGNOSIS — K5730 Diverticulosis of large intestine without perforation or abscess without bleeding: Secondary | ICD-10-CM | POA: Insufficient documentation

## 2020-04-21 DIAGNOSIS — M1990 Unspecified osteoarthritis, unspecified site: Secondary | ICD-10-CM | POA: Insufficient documentation

## 2020-04-21 DIAGNOSIS — Z8673 Personal history of transient ischemic attack (TIA), and cerebral infarction without residual deficits: Secondary | ICD-10-CM | POA: Diagnosis not present

## 2020-04-21 DIAGNOSIS — R110 Nausea: Secondary | ICD-10-CM

## 2020-04-21 DIAGNOSIS — R1013 Epigastric pain: Secondary | ICD-10-CM | POA: Insufficient documentation

## 2020-04-21 DIAGNOSIS — N183 Chronic kidney disease, stage 3 unspecified: Secondary | ICD-10-CM | POA: Insufficient documentation

## 2020-04-21 DIAGNOSIS — Z8711 Personal history of peptic ulcer disease: Secondary | ICD-10-CM | POA: Insufficient documentation

## 2020-04-21 DIAGNOSIS — Z7982 Long term (current) use of aspirin: Secondary | ICD-10-CM | POA: Insufficient documentation

## 2020-04-21 DIAGNOSIS — Z885 Allergy status to narcotic agent status: Secondary | ICD-10-CM | POA: Insufficient documentation

## 2020-04-21 HISTORY — PX: ESOPHAGOGASTRODUODENOSCOPY (EGD) WITH PROPOFOL: SHX5813

## 2020-04-21 HISTORY — PX: BALLOON DILATION: SHX5330

## 2020-04-21 HISTORY — PX: BIOPSY: SHX5522

## 2020-04-21 SURGERY — ESOPHAGOGASTRODUODENOSCOPY (EGD) WITH PROPOFOL
Anesthesia: Monitor Anesthesia Care

## 2020-04-21 MED ORDER — PROPOFOL 500 MG/50ML IV EMUL
INTRAVENOUS | Status: DC | PRN
  Administered 2020-04-21: 70 mg via INTRAVENOUS
  Administered 2020-04-21: 150 ug/kg/min via INTRAVENOUS

## 2020-04-21 MED ORDER — LACTATED RINGERS IV SOLN: INTRAVENOUS | Status: DC

## 2020-04-21 MED ORDER — SODIUM CHLORIDE 0.9 % IV SOLN: INTRAVENOUS | Status: DC

## 2020-04-21 SURGICAL SUPPLY — 14 items

## 2020-04-21 NOTE — Telephone Encounter (Signed)
Instructions mailed to the pt home

## 2020-04-21 NOTE — Telephone Encounter (Signed)
-----   Message from Milus Banister, MD sent at 04/21/2020  9:11 AM EDT ----- Sorry, She also needs [facility] referral to consider elective chlolecystectomy for symptomatic gallstones.  Thanks

## 2020-04-21 NOTE — Op Note (Addendum)
[facility] Patient Name: Morgan [name] Procedure Date: 04/21/2020 MRN: [id number] Attending MD: Milus Banister , MD Date of Birth: May 09, 1951 CSN: [id number] Age: 69 Admit Type: Outpatient Procedure:                Upper GI endoscopy Indications:              Dyspepsia, Indigestion, Dysphagia, pyloric stenosis                            noted 2009 Dr. Olevia Perches felt likely due to previous                            ulcer disease Providers:                Milus Banister, MD, Elmer Ramp. Tilden Dome, RN, Clyde Lundborg, RN, Laverda Sorenson, Technician, Eliberto Ivory, CRNA Referring MD:              Medicines:                Monitored Anesthesia Care Complications:            No immediate complications. Estimated blood loss:                            None. Estimated Blood Loss:     Estimated blood loss: none. Procedure:                Pre-Anesthesia Assessment:                           - Prior to the procedure, a History and Physical                            was performed, and patient medications and                            allergies were reviewed. The patient's tolerance of                            previous anesthesia was also reviewed. The risks                            and benefits of the procedure and the sedation                            options and risks were discussed with the patient.                            All questions were answered, and informed consent                            was obtained. Prior  Anticoagulants: The patient has                            taken no previous anticoagulant or antiplatelet                            agents. ASA Grade Assessment: IV - A patient with                            severe systemic disease that is a constant threat                            to life. After reviewing the risks and benefits,                            the patient was deemed in satisfactory condition  to                            undergo the procedure.                           After obtaining informed consent, the endoscope was                            passed under direct vision. Throughout the                            procedure, the patient's blood pressure, pulse, and                            oxygen saturations were monitored continuously. The                            [id number]-[id number] Olympus gastroscope was                            introduced through the mouth, and advanced to the                            second part of duodenum. The upper GI endoscopy was                            accomplished without difficulty. The patient                            tolerated the procedure well. Scope In: Scope Out: Findings:      Normal GE junction.      Mild inflammation characterized by erythema was found in the gastric       antrum. Biopsies were taken with a cold forceps for histology.      A benign-appearing, intrinsic moderate stenosis was found at the       pylorus. A TTS dilator was passed through the scope. Dilation with a       12-13.5-15 mm balloon dilator was performed to 15 mm. There  was a       typical post dilation mucosal disruption and self limited minor       bleeding. I biopsied the mucosa within the stenosis following dilation       to confirm no underlying neoplasm.      The exam was otherwise without abnormality. Impression:               - Normal GE junction.                           - Chronic, benign appearing pyloric stenosis.                            Biopsied and dilated to 32mm with CRE balloon.                           - Mild, non-specific gastritis was biopsied to                            check for H. pylori. Moderate Sedation:      Not Applicable - Patient had care per Anesthesia. Recommendation:           - Patient has a contact number available for                            emergencies. The signs and symptoms of potential                             delayed complications were discussed with the                            patient. Return to normal activities tomorrow.                            Written discharge instructions were provided to the                            patient.                           - Resume previous diet.                           - Continue present medications.                           - Await pathology results.                           - My office will arrange repeat EGD at [facility] in 2-3                            weeks to repeat the pyloric dilation, hopefully to                            68mm or larger.                           -  My office will also arrange referral to                            [facility]; some of her GI symptoms, especially                            epigastric and RUQ pains, may be due to the                            calcified gallstone in neck of the gallbladder seen                            on recent CT scan. Procedure Code(s):        --- Professional ---                           [phone], Esophagogastroduodenoscopy, flexible,                            transoral; with dilation of gastric/duodenal                            stricture(s) (eg, balloon, bougie)                           43239, 65, Esophagogastroduodenoscopy, flexible,                            transoral; with biopsy, single or multiple Diagnosis Code(s):        --- Professional ---                           K29.70, Gastritis, unspecified, without bleeding                           K31.1, Adult hypertrophic pyloric stenosis                           R10.13, Epigastric pain                           K30, Functional dyspepsia                           R13.10, Dysphagia, unspecified CPT copyright 2019 American Medical Association. All rights reserved. The codes documented in this report are preliminary and upon coder review may  be revised to meet current compliance requirements. Milus Banister,  MD 04/21/2020 8:57:58 AM This report has been signed electronically. Number of Addenda: 0

## 2020-04-21 NOTE — Telephone Encounter (Signed)
The pt has been advised of the appt for 05/26/20 at 930 am [facility] with Dr Ardis Hughs.  COVID test 05/23/20 at 1015 am

## 2020-04-21 NOTE — Telephone Encounter (Signed)
Yes thanks 

## 2020-04-21 NOTE — Transfer of Care (Signed)
Immediate Anesthesia Transfer of Care Note  Patient: Morgan [name]  Procedure(s) Performed: Procedure(s): ESOPHAGOGASTRODUODENOSCOPY (EGD) WITH PROPOFOL (N/A)  Patient Location: PACU and Endoscopy Unit  Anesthesia Type:MAC  Level of Consciousness: awake, alert  and oriented  Airway & Oxygen Therapy: Patient Spontanous Breathing and Patient connected to nasal cannula oxygen  Post-op Assessment: Report given to RN and Post -op Vital signs reviewed and stable  Post vital signs: Reviewed and stable  Last Vitals:  Vitals:   04/21/20 0756  BP: (!) 168/86  Pulse: 71  Resp: 17  Temp: 36.4 C  SpO2: 22%    Complications: No apparent anesthesia complications

## 2020-04-21 NOTE — Telephone Encounter (Signed)
Dr Ardis Hughs the pt can not do 5/27 she has to take her grandson for surgery.  This is going to push her out into June is that ok?

## 2020-04-21 NOTE — Anesthesia Postprocedure Evaluation (Signed)
Anesthesia Post Note  Patient: Morgan [name]  Procedure(s) Performed: ESOPHAGOGASTRODUODENOSCOPY (EGD) WITH PROPOFOL (N/A ) BIOPSY BALLOON DILATION (N/A )     Patient location during evaluation: Endoscopy Anesthesia Type: MAC Level of consciousness: awake and alert Pain management: pain level controlled Vital Signs Assessment: post-procedure vital signs reviewed and stable Respiratory status: spontaneous breathing, nonlabored ventilation and respiratory function stable Cardiovascular status: blood pressure returned to baseline and stable Postop Assessment: no apparent nausea or vomiting Anesthetic complications: no    Last Vitals:  Vitals:   04/21/20 0900 04/21/20 0915  BP: (!) 161/86 (!) 160/69  Pulse: 70 68  Resp: 19 19  Temp:    SpO2: 96% 97%    Last Pain:  Vitals:   04/21/20 0915  TempSrc:   PainSc: 0-No pain                 Lidia Collum

## 2020-04-21 NOTE — Discharge Instructions (Signed)
YOU HAD AN ENDOSCOPIC PROCEDURE TODAY: Refer to the procedure report and other information in the discharge instructions given to you for any specific questions about what was found during the examination. If this information does not answer your questions, please call [place] office at [phone] to clarify.  ° °YOU SHOULD EXPECT: Some feelings of bloating in the abdomen. Passage of more gas than usual. Walking can help get rid of the air that was put into your GI tract during the procedure and reduce the bloating. If you had a lower endoscopy (such as a colonoscopy or flexible sigmoidoscopy) you may notice spotting of blood in your stool or on the toilet paper. Some abdominal soreness may be present for a day or two, also. ° °DIET: Your first meal following the procedure should be a light meal and then it is ok to progress to your normal diet. A half-sandwich or bowl of soup is an example of a good first meal. Heavy or fried foods are harder to digest and may make you feel nauseous or bloated. Drink plenty of fluids but you should avoid alcoholic beverages for 24 hours. If you had a esophageal dilation, please see attached instructions for diet.   ° °ACTIVITY: Your care partner should take you home directly after the procedure. You should plan to take it easy, moving slowly for the rest of the day. You can resume normal activity the day after the procedure however YOU SHOULD NOT DRIVE, use power tools, machinery or perform tasks that involve climbing or major physical exertion for 24 hours (because of the sedation medicines used during the test).  ° °SYMPTOMS TO REPORT IMMEDIATELY: °A gastroenterologist can be reached at any hour. Please call [phone]  for any of the following symptoms:  °Following lower endoscopy (colonoscopy, flexible sigmoidoscopy) °Excessive amounts of blood in the stool  °Significant tenderness, worsening of abdominal pains  °Swelling of the abdomen that is new, acute  °Fever of 100° or  higher  °Following upper endoscopy (EGD, EUS, ERCP, esophageal dilation) °Vomiting of blood or coffee ground material  °New, significant abdominal pain  °New, significant chest pain or pain under the shoulder blades  °Painful or persistently difficult swallowing  °New shortness of breath  °Black, tarry-looking or red, bloody stools ° °FOLLOW UP:  °If any biopsies were taken you will be contacted by phone or by letter within the next 1-3 weeks. Call [phone]  if you have not heard about the biopsies in 3 weeks.  °Please also call with any specific questions about appointments or follow up tests. ° °

## 2020-04-21 NOTE — Telephone Encounter (Signed)
Milus Banister, MD  Timothy Lasso, RN  Hey, she needs repeat EGD at [facility] in 2-3 weeks on a Thursday (not during my upcoming hosp week) for pyloric stricture, dilation. Thanks

## 2020-04-21 NOTE — Interval H&P Note (Signed)
History and Physical Interval Note:  04/21/2020 8:04 AM  Morgan [name]  has presented today for surgery, with the diagnosis of Julaine Hua, Nausea, Early Satiety.  The various methods of treatment have been discussed with the patient and family. After consideration of risks, benefits and other options for treatment, the patient has consented to  Procedure(s): ESOPHAGOGASTRODUODENOSCOPY (EGD) WITH PROPOFOL (N/A) SAVORY DILATION (N/A) as a surgical intervention.  The patient's history has been reviewed, patient examined, no change in status, stable for surgery.  I have reviewed the patient's chart and labs.  Questions were answered to the patient's satisfaction.     Milus Banister

## 2020-04-21 NOTE — Anesthesia Preprocedure Evaluation (Signed)
Anesthesia Evaluation  Patient identified by MRN, date of birth, ID band Patient awake    Reviewed: Allergy & Precautions, NPO status , Patient's Chart, lab work & pertinent test results  History of Anesthesia Complications Negative for: history of anesthetic complications  Airway Mallampati: II  TM Distance: >3 FB Neck ROM: Full    Dental  (+) Upper Dentures, Lower Dentures   Pulmonary COPD,  oxygen dependent, former smoker,    Pulmonary exam normal        Cardiovascular hypertension, +CHF  Normal cardiovascular exam     Neuro/Psych CVA negative psych ROS   GI/Hepatic Neg liver ROS, PUD, GERD  ,  Endo/Other  negative endocrine ROS  Renal/GU Renal InsufficiencyRenal disease  negative genitourinary   Musculoskeletal  (+) Arthritis ,   Abdominal   Peds  Hematology negative hematology ROS (+)   Anesthesia Other Findings   Reproductive/Obstetrics                            Anesthesia Physical Anesthesia Plan  ASA: IV  Anesthesia Plan: MAC   Post-op Pain Management:    Induction: Intravenous  PONV Risk Score and Plan: 2 and Propofol infusion, TIVA and Treatment may vary due to age or medical condition  Airway Management Planned: Natural Airway, Nasal Cannula and Simple Face Mask  Additional Equipment: None  Intra-op Plan:   Post-operative Plan:   Informed Consent: I have reviewed the patients History and Physical, chart, labs and discussed the procedure including the risks, benefits and alternatives for the proposed anesthesia with the patient or authorized representative who has indicated his/her understanding and acceptance.       Plan Discussed with:   Anesthesia Plan Comments:         Anesthesia Quick Evaluation

## 2020-04-22 ENCOUNTER — Other Ambulatory Visit: Payer: Self-pay

## 2020-04-22 ENCOUNTER — Encounter: Payer: Self-pay | Admitting: *Deleted

## 2020-04-22 LAB — SURGICAL PATHOLOGY

## 2020-05-04 ENCOUNTER — Encounter: Payer: Self-pay | Admitting: Gastroenterology

## 2020-05-04 ENCOUNTER — Ambulatory Visit: Payer: Medicare HMO | Admitting: Gastroenterology

## 2020-05-04 VITALS — BP 124/74 | HR 62 | Ht 73.0 in | Wt 242.0 lb

## 2020-05-04 DIAGNOSIS — R110 Nausea: Secondary | ICD-10-CM

## 2020-05-04 DIAGNOSIS — K5904 Chronic idiopathic constipation: Secondary | ICD-10-CM

## 2020-05-04 DIAGNOSIS — R1011 Right upper quadrant pain: Secondary | ICD-10-CM

## 2020-05-04 DIAGNOSIS — R109 Unspecified abdominal pain: Secondary | ICD-10-CM

## 2020-05-04 DIAGNOSIS — K219 Gastro-esophageal reflux disease without esophagitis: Secondary | ICD-10-CM | POA: Diagnosis not present

## 2020-05-04 DIAGNOSIS — D509 Iron deficiency anemia, unspecified: Secondary | ICD-10-CM

## 2020-05-04 DIAGNOSIS — R1310 Dysphagia, unspecified: Secondary | ICD-10-CM | POA: Diagnosis not present

## 2020-05-04 MED ORDER — SUCRALFATE 1 G PO TABS
1.0000 g | ORAL_TABLET | Freq: Three times a day (TID) | ORAL | 3 refills | Status: DC
Start: 2020-05-04 — End: 2020-07-26

## 2020-05-04 MED ORDER — DEXILANT 60 MG PO CPDR
60.0000 mg | DELAYED_RELEASE_CAPSULE | Freq: Every day | ORAL | 3 refills | Status: DC
Start: 2020-05-04 — End: 2020-05-20

## 2020-05-04 MED ORDER — PROMETHAZINE HCL 25 MG PO TABS: 25.0000 mg | ORAL_TABLET | Freq: Every day | ORAL | 1 refills | Status: DC | PRN

## 2020-05-04 MED ORDER — SUPREP BOWEL PREP KIT 17.5-3.13-1.6 GM/177ML PO SOLN
1.0000 | ORAL | 0 refills | Status: DC
Start: 2020-05-04 — End: 2020-05-20

## 2020-05-04 MED ORDER — LINACLOTIDE 145 MCG PO CAPS: 145.0000 ug | ORAL_CAPSULE | Freq: Every day | ORAL | 3 refills | Status: DC

## 2020-05-04 NOTE — Progress Notes (Signed)
Morgan [name]    [id number]    24-Nov-1951  Primary Care Physician:Cody, Jobe Marker, MD  Referring Physician: [name], MD [place],  [place] [address]    Chief complaint:  Nausea, GERD  HPI: 69 year old female with chronic iron deficiency anemia here with complaints of worsening reflux symptoms and dysphagia  Feels her symptoms are worse after EGD Apr 21, 2020.  She feels food is getting hung up in her throat.  Complains of intermittent nausea  History of chronic constipation and intermittent pain in the right side, upper abdomen, lower abdomen and flank  Takes NSAIDs, Excedrin Migraine as needed  Iron/TIBC/Ferritin/ %Sat    Component Value Date/Time   IRON 24 (L) 03/22/2020 1240   FERRITIN 13.9 03/22/2020 1235   IRONPCTSAT 6.4 (L) 03/22/2020 1240   CBC Latest Ref Rng & Units 03/22/2020 12/21/2019 10/20/2019  WBC 4.0 - 10.5 K/uL 9.9 10.3 9.3  Hemoglobin 12.0 - 15.0 g/dL 10.5(L) 10.0(L) 9.9(L)  Hematocrit 36.0 - 46.0 % 32.9(L) 32.3(L) 33.1(L)  Platelets 150.0 - 400.0 K/uL 328.0 333 325   EGD Apr 21, 2020: By Dr. Ardis Hughs: Benign appearing pyloric channel stenosis, biopsied and dilated with CRE balloon to 15 mm  EGD January 2009 showed pyloric stenosis. Gastric antrum biopsies were obtained showed benign antral mucosa with no dysplasia, intestinal metaplasia or malignancy. Colonoscopy January 2009 showed sigmoid and descending colon diverticulosis and small internal hemorrhoids  Outpatient Encounter Medications as of 05/04/2020  Medication Sig  . albuterol (VENTOLIN HFA) 108 (90 Base) MCG/ACT inhaler TAKE 2 PUFFS BY MOUTH EVERY 6 HOURS AS NEEDED FOR WHEEZE OR SHORTNESS OF BREATH  . aspirin 81 MG EC tablet Take 81 mg by mouth daily. daily  . aspirin-acetaminophen-caffeine (EXCEDRIN MIGRAINE) 250-250-65 MG tablet Take 2 tablets by mouth every 6 (six) hours as needed for headache.  Marland Kitchen atorvastatin (LIPITOR) 40 MG tablet Take 40 mg by mouth daily.   .  carvedilol (COREG) 3.125 MG tablet Take 3.125 mg by mouth in the morning and at bedtime.  . colestipol (COLESTID) 1 g tablet TAKE 1 TABLET (1 G TOTAL) BY MOUTH 2 (TWO) TIMES DAILY.  Marland Kitchen esomeprazole (NEXIUM) 40 MG capsule Take 1 capsule by mouth twice daily. (Patient taking differently: Take 40 mg by mouth 2 (two) times daily. )  . Ferrous Sulfate (IRON) 325 (65 Fe) MG TABS Take 1 tablet (325 mg total) by mouth daily.  Marland Kitchen FLUoxetine (PROZAC) 40 MG capsule Take 40 mg by mouth daily.  . furosemide (LASIX) 40 MG tablet HOLD UNTIL seen by your doctor (Patient taking differently: Take 40 mg by mouth daily. )  . gabapentin (NEURONTIN) 400 MG capsule Take 400 mg by mouth 3 (three) times daily.   . hydrOXYzine (ATARAX/VISTARIL) 25 MG tablet Take 12.5-25 mg by mouth 3 (three) times daily as needed for anxiety.  . mirtazapine (REMERON) 45 MG tablet Take 45 mg by mouth at bedtime.   . OXYGEN Inhale into the lungs. 02 at bedtime  . promethazine (PHENERGAN) 25 MG tablet Take 1 tablet (25 mg total) by mouth every 6 (six) hours as needed for nausea or vomiting.  . [DISCONTINUED] cyclobenzaprine (FLEXERIL) 5 MG tablet Take 1 tablet (5 mg total) by mouth 3 (three) times daily as needed for muscle spasms. (Patient not taking: Reported on 04/11/2020)   No facility-administered encounter medications on file as of 05/04/2020.    Allergies as of 05/04/2020 - Review Complete  04/21/2020  Allergen Reaction Noted  . Demerol [meperidine] Hives 04/28/2013  . Strawberry (diagnostic) Hives 08/26/2017  . Wasp venom Hives 04/11/2020    Past Medical History:  Diagnosis Date  . Allergy   . Anemia   . Anxiety   . Arthritis   . Depression   . GERD (gastroesophageal reflux disease)   . Hypertension   . MVP (mitral valve prolapse)     Past Surgical History:  Procedure Laterality Date  . ABDOMINAL HYSTERECTOMY    . APPENDECTOMY    . BALLOON DILATION N/A 04/21/2020   Procedure: BALLOON DILATION;  Surgeon: Milus Banister,  MD;  Location: [facility];  Service: Endoscopy;  Laterality: N/A;  pyloric/ GI  . BIOPSY  04/21/2020   Procedure: BIOPSY;  Surgeon: Milus Banister, MD;  Location: [facility] ENDOSCOPY;  Service: Endoscopy;;  . BREAST SURGERY Right 1988   tumor removal  . ESOPHAGOGASTRODUODENOSCOPY (EGD) WITH PROPOFOL N/A 04/21/2020   Procedure: ESOPHAGOGASTRODUODENOSCOPY (EGD) WITH PROPOFOL;  Surgeon: Milus Banister, MD;  Location: [facility] ENDOSCOPY;  Service: Endoscopy;  Laterality: N/A;  . KNEE SURGERY  2005   2 times left  . TUBAL LIGATION      Family History  Problem Relation Age of Onset  . Breast cancer Mother 30  . Ovarian cancer Mother 37  . Brain cancer Mother   . Hypertension Father   . Hyperlipidemia Father   . Heart disease Father   . Breast cancer Maternal Grandmother 84  . Diabetes Paternal Grandmother   . Breast cancer Paternal Grandmother 38  . Cancer Paternal Grandfather   . Breast cancer Maternal Aunt 60  . Breast cancer Maternal Aunt 60    Social History   Socioeconomic History  . Marital status: Single    Spouse name: Not on file  . Number of children: 2  . Years of education: Not on file  . Highest education level: Not on file  Occupational History  . Not on file  Tobacco Use  . Smoking status: Former Smoker    Packs/day: 1.00    Years: 15.00    Pack years: 15.00    Types: Cigarettes    Quit date: 09/12/2017    Years since quitting: 2.6  . Smokeless tobacco: Never Used  Substance and Sexual Activity  . Alcohol use: Not Currently  . Drug use: No  . Sexual activity: Not Currently  Other Topics Concern  . 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 Difficulty of Paying Living Expenses:   Food Insecurity:   . Worried About Charity fundraiser in the Last Year:   . Arboriculturist in the Last Year:   Transportation Needs:   . Film/video editor (Medical):   Marland Kitchen Lack of Transportation (Non-Medical):   Physical Activity:   . Days of Exercise per Week:   . Minutes of Exercise per Session:   Stress:   . Feeling of Stress :  Social Connections:   . Frequency of Communication with Friends and Family:   . Frequency of Social Gatherings with Friends and Family:   . Attends Religious Services:   . Active Member of Clubs or Organizations:   . Attends Archivist Meetings:   Marland Kitchen Marital Status:   Intimate Partner Violence:   . Fear of Current or Ex-Partner:   . Emotionally Abused:   Marland Kitchen Physically Abused:   . Sexually Abused:       Review of systems: All other review of systems negative except as mentioned in the HPI.   Physical Exam: Vitals:   05/04/20 0816  BP: 124/74  Pulse: 62  SpO2: 97%   Body mass index is 31.93 kg/m. Gen:      No acute distress Abd:      soft, non-tender; no palpable masses, no distension Neuro: alert and oriented x 3 Psych: normal mood and affect  Data Reviewed:  Reviewed labs, radiology imaging, old records and pertinent past GI work up   Assessment and Plan/Recommendations:  69 year old female with history of chronic GERD with complaints of nausea, intermittent dysphagia and right-sided abdominal pain  Obtain abdominal ultrasound to exclude gallbladder disease  Upper GI series to further evaluate pyloric channel stenosis and any significant delay in flow of contrast through the stomach  GERD: Worsening symptoms.  Start Dexilant 60 mg daily, discussed antireflux measures Carafate 1 g daily before meals and at bedtime  Nausea: Use  Phenergan 25 mg daily as needed for severe symptoms  Chronic idiopathic constipation: Linzess 145 mcg daily before breakfast Increase dietary fiber and fluid intake  Iron deficiency anemia, change in bowel habits, right side abdominal pain, dysphagia, persistent GERD symptoms and nausea Schedule for EGD and colonoscopy for further evaluation Past due for colorectal cancer screening The risks and benefits as well as alternatives of endoscopic procedure(s) have been discussed and reviewed. All questions answered. The patient agrees to proceed.   Return in 2 to 3 months  This visit required 45 minutes of patient care (this includes precharting, chart review, review of results, face-to-face time used for counseling as well as treatment plan and follow-up. The patient was provided an opportunity to ask questions and all were answered. The patient agreed with the plan and demonstrated an understanding of the instructions.  Damaris Hippo , MD    CC: [name], MD

## 2020-05-04 NOTE — Patient Instructions (Addendum)
If you are age 67 or older, your body mass index should be between 23-30. Your Body mass index is 31.93 kg/m. If this is out of the aforementioned range listed, please consider follow up with your Primary Care Provider.  If you are age 30 or younger, your body mass index should be between 19-25. Your Body mass index is 31.93 kg/m. If this is out of the aformentioned range listed, please consider follow up with your Primary Care Provider.   You have been scheduled for an endoscopy and colonoscopy. Please follow the written instructions given to you at your visit today. Please pick up your prep supplies at the pharmacy within the next 1-3 days. If you use inhalers (even only as needed), please bring them with you on the day of your procedure.  You have been scheduled for an abdominal ultrasound at [facility] (1st floor of hospital) on 05-10-20 at 8:30. Please arrive 15 minutes prior to your appointment for registration. Make certain not to have anything to eat or drink after midnight prior to your appointment. Should you need to reschedule your appointment, please contact radiology at [phone]. This test typically takes about 30 minutes to perform.  You have been scheduled for an Upper GI Series at [facility] (1st floor on hospital). Your appointment is on 05-11-20 at 11:00am. Please arrive 15 minutes prior to your test for registration. Make sure not to eat or drink anything after midnight on the night before your test. If you need to reschedule, please call radiology at [phone]. __________________________________________________________ An upper GI series uses x rays to help diagnose problems of the upper GI tract, which includes the esophagus, stomach, and duodenum. The duodenum is the first part of the small intestine. An upper GI series is conducted by a radiology technologist or a radiologist--a doctor who specializes in x-ray imaging--at a hospital or outpatient  center. While sitting or standing in front of an x-ray machine, the patient drinks barium liquid, which is often white and has a chalky consistency and taste. The barium liquid coats the lining of the upper GI tract and makes signs of disease show up more clearly on x rays. X-ray video, called fluoroscopy, is used to view the barium liquid moving through the esophagus, stomach, and duodenum. Additional x rays and fluoroscopy are performed while the patient lies on an x-ray table. To fully coat the upper GI tract with barium liquid, the technologist or radiologist may press on the abdomen or ask the patient to change position. Patients hold still in various positions, allowing the technologist or radiologist to take x rays of the upper GI tract at different angles. If a technologist conducts the upper GI series, a radiologist will later examine the images to look for problems.  This test typically takes about 1 hour to complete. __________________________________________________________    Due to recent COVID-19 restrictions implemented by our local and state authorities and in an effort to keep both patients and staff as safe as possible, our hospital system now requires COVID-19 testing prior to any scheduled hospital procedure. Please go to our [facility] location drive thru testing site ([address] [place], [place] [address]) on 06-29-20 at  9:30am. There will be multiple testing areas, the first checkpoint being for pre-procedure/surgery testing. Get into the right (yellow) lane that leads to the PAT testing team. You will not be billed at the time of testing but may receive a bill later depending on your insurance. The approximate  cost of the test is $100. You must agree to quarantine from the time of your testing until the procedure date on 07-04-20 . This should include staying at home with ONLY the people you live with. Avoid take-out, grocery store shopping or leaving the house for any  non-emergent reason. Failure to have your COVID-19 test done on the date and time you have been scheduled will result in cancellation of procedure. Please call our office at [phone] if you have any questions.    Due to recent changes in healthcare laws, you may see the results of your imaging and laboratory studies on MyChart before your provider has had a chance to review them.  We understand that in some cases there may be results that are confusing or concerning to you. Not all laboratory results come back in the same time frame and the provider may be waiting for multiple results in order to interpret others.  Please give [place] 48 hours in order for your provider to thoroughly review all the results before contacting the office for clarification of your results.   We have sent the following medications to your pharmacy for you to pick up at your convenience:  START: Dexilant 60mg  take one capsule daily.   START: Linzess 145 mcg take one capsule with breakfast daily.  START: Carafate 1 gram tablet take 1 tablet 4 times daily- with meals and at bedtime.  CHANGE: Phenergan 25mg  to once daily as needed.   You will be seen in the office for follow up in August 2021.   I appreciate the opportunity to care for you. Thank you for choosing me and [facility],  Dr. Harl Bowie

## 2020-05-06 ENCOUNTER — Encounter: Payer: Self-pay | Admitting: Gastroenterology

## 2020-05-10 ENCOUNTER — Ambulatory Visit (HOSPITAL_COMMUNITY)
Admission: RE | Admit: 2020-05-10 | Discharge: 2020-05-10 | Disposition: A | Discharge status: Home / Self Care | Payer: Medicare HMO | Source: Ambulatory Visit | Attending: Gastroenterology | Admitting: Gastroenterology

## 2020-05-10 ENCOUNTER — Other Ambulatory Visit: Payer: Self-pay

## 2020-05-10 DIAGNOSIS — K219 Gastro-esophageal reflux disease without esophagitis: Secondary | ICD-10-CM | POA: Insufficient documentation

## 2020-05-10 DIAGNOSIS — R110 Nausea: Secondary | ICD-10-CM | POA: Diagnosis present

## 2020-05-10 DIAGNOSIS — R1310 Dysphagia, unspecified: Secondary | ICD-10-CM | POA: Diagnosis present

## 2020-05-10 DIAGNOSIS — R1011 Right upper quadrant pain: Secondary | ICD-10-CM | POA: Insufficient documentation

## 2020-05-10 DIAGNOSIS — D509 Iron deficiency anemia, unspecified: Secondary | ICD-10-CM | POA: Diagnosis present

## 2020-05-10 DIAGNOSIS — K5904 Chronic idiopathic constipation: Secondary | ICD-10-CM | POA: Diagnosis present

## 2020-05-11 ENCOUNTER — Other Ambulatory Visit (HOSPITAL_COMMUNITY): Payer: Medicare HMO

## 2020-05-16 ENCOUNTER — Encounter (HOSPITAL_COMMUNITY): Payer: Self-pay | Admitting: Emergency Medicine

## 2020-05-16 ENCOUNTER — Other Ambulatory Visit: Payer: Self-pay

## 2020-05-16 ENCOUNTER — Emergency Department (HOSPITAL_COMMUNITY): Payer: Medicare HMO

## 2020-05-16 ENCOUNTER — Telehealth: Payer: Self-pay | Admitting: Physician Assistant

## 2020-05-16 ENCOUNTER — Inpatient Hospital Stay (HOSPITAL_COMMUNITY)
Admission: EM | Admit: 2020-05-16 | Discharge: 2020-05-20 | DRG: 418 | Disposition: A | Discharge status: Home / Self Care | Payer: Medicare HMO | Attending: Internal Medicine | Admitting: Internal Medicine

## 2020-05-16 DIAGNOSIS — Z885 Allergy status to narcotic agent status: Secondary | ICD-10-CM

## 2020-05-16 DIAGNOSIS — Z1612 Extended spectrum beta lactamase (ESBL) resistance: Secondary | ICD-10-CM | POA: Diagnosis present

## 2020-05-16 DIAGNOSIS — Z8249 Family history of ischemic heart disease and other diseases of the circulatory system: Secondary | ICD-10-CM

## 2020-05-16 DIAGNOSIS — G8929 Other chronic pain: Secondary | ICD-10-CM | POA: Diagnosis present

## 2020-05-16 DIAGNOSIS — Z20822 Contact with and (suspected) exposure to covid-19: Secondary | ICD-10-CM | POA: Diagnosis present

## 2020-05-16 DIAGNOSIS — K222 Esophageal obstruction: Secondary | ICD-10-CM | POA: Diagnosis present

## 2020-05-16 DIAGNOSIS — Z7982 Long term (current) use of aspirin: Secondary | ICD-10-CM

## 2020-05-16 DIAGNOSIS — R8271 Bacteriuria: Secondary | ICD-10-CM | POA: Diagnosis present

## 2020-05-16 DIAGNOSIS — R1310 Dysphagia, unspecified: Secondary | ICD-10-CM

## 2020-05-16 DIAGNOSIS — Z9981 Dependence on supplemental oxygen: Secondary | ICD-10-CM

## 2020-05-16 DIAGNOSIS — F339 Major depressive disorder, recurrent, unspecified: Secondary | ICD-10-CM | POA: Diagnosis present

## 2020-05-16 DIAGNOSIS — Z87891 Personal history of nicotine dependence: Secondary | ICD-10-CM

## 2020-05-16 DIAGNOSIS — K811 Chronic cholecystitis: Secondary | ICD-10-CM

## 2020-05-16 DIAGNOSIS — F419 Anxiety disorder, unspecified: Secondary | ICD-10-CM | POA: Diagnosis present

## 2020-05-16 DIAGNOSIS — I509 Heart failure, unspecified: Secondary | ICD-10-CM | POA: Diagnosis present

## 2020-05-16 DIAGNOSIS — K219 Gastro-esophageal reflux disease without esophagitis: Secondary | ICD-10-CM | POA: Diagnosis present

## 2020-05-16 DIAGNOSIS — F418 Other specified anxiety disorders: Secondary | ICD-10-CM | POA: Diagnosis present

## 2020-05-16 DIAGNOSIS — I130 Hypertensive heart and chronic kidney disease with heart failure and stage 1 through stage 4 chronic kidney disease, or unspecified chronic kidney disease: Secondary | ICD-10-CM | POA: Diagnosis present

## 2020-05-16 DIAGNOSIS — R1011 Right upper quadrant pain: Secondary | ICD-10-CM | POA: Diagnosis not present

## 2020-05-16 DIAGNOSIS — R109 Unspecified abdominal pain: Secondary | ICD-10-CM

## 2020-05-16 DIAGNOSIS — I10 Essential (primary) hypertension: Secondary | ICD-10-CM | POA: Diagnosis not present

## 2020-05-16 DIAGNOSIS — J449 Chronic obstructive pulmonary disease, unspecified: Secondary | ICD-10-CM | POA: Diagnosis present

## 2020-05-16 DIAGNOSIS — Z83438 Family history of other disorder of lipoprotein metabolism and other lipidemia: Secondary | ICD-10-CM

## 2020-05-16 DIAGNOSIS — N1831 Chronic kidney disease, stage 3a: Secondary | ICD-10-CM | POA: Diagnosis present

## 2020-05-16 DIAGNOSIS — K8012 Calculus of gallbladder with acute and chronic cholecystitis without obstruction: Secondary | ICD-10-CM | POA: Diagnosis not present

## 2020-05-16 DIAGNOSIS — Z833 Family history of diabetes mellitus: Secondary | ICD-10-CM

## 2020-05-16 DIAGNOSIS — Z79899 Other long term (current) drug therapy: Secondary | ICD-10-CM

## 2020-05-16 DIAGNOSIS — Z91018 Allergy to other foods: Secondary | ICD-10-CM

## 2020-05-16 DIAGNOSIS — F329 Major depressive disorder, single episode, unspecified: Secondary | ICD-10-CM | POA: Diagnosis present

## 2020-05-16 DIAGNOSIS — K5904 Chronic idiopathic constipation: Secondary | ICD-10-CM | POA: Diagnosis present

## 2020-05-16 DIAGNOSIS — K8000 Calculus of gallbladder with acute cholecystitis without obstruction: Secondary | ICD-10-CM

## 2020-05-16 DIAGNOSIS — D509 Iron deficiency anemia, unspecified: Secondary | ICD-10-CM | POA: Diagnosis present

## 2020-05-16 DIAGNOSIS — N183 Chronic kidney disease, stage 3 unspecified: Secondary | ICD-10-CM | POA: Diagnosis present

## 2020-05-16 DIAGNOSIS — F32A Depression, unspecified: Secondary | ICD-10-CM | POA: Diagnosis present

## 2020-05-16 DIAGNOSIS — B9620 Unspecified Escherichia coli [E. coli] as the cause of diseases classified elsewhere: Secondary | ICD-10-CM | POA: Diagnosis present

## 2020-05-16 DIAGNOSIS — R112 Nausea with vomiting, unspecified: Secondary | ICD-10-CM

## 2020-05-16 LAB — COMPREHENSIVE METABOLIC PANEL
ALT: 13 U/L (ref 0–44)
AST: 16 U/L (ref 15–41)
Albumin: 3.4 g/dL — ABNORMAL LOW (ref 3.5–5.0)
Alkaline Phosphatase: 109 U/L (ref 38–126)
Anion gap: 9 (ref 5–15)
BUN: 22 mg/dL (ref 8–23)
CO2: 29 mmol/L (ref 22–32)
Calcium: 8.6 mg/dL — ABNORMAL LOW (ref 8.9–10.3)
Chloride: 103 mmol/L (ref 98–111)
Creatinine, Ser: 0.95 mg/dL (ref 0.44–1.00)
GFR calc Af Amer: >60 mL/min (ref >60)
GFR calc non Af Amer: >60 mL/min (ref >60)
Glucose, Bld: 114 mg/dL — ABNORMAL HIGH (ref 70–99)
Potassium: 4.1 mmol/L (ref 3.5–5.1)
Sodium: 141 mmol/L (ref 135–145)
Total Bilirubin: 0.5 mg/dL (ref 0.3–1.2)
Total Protein: 6.8 g/dL (ref 6.5–8.1)

## 2020-05-16 LAB — URINALYSIS, ROUTINE W REFLEX MICROSCOPIC
Bilirubin Urine: NEGATIVE
Glucose, UA: NEGATIVE mg/dL
Ketones, ur: NEGATIVE mg/dL
Nitrite: NEGATIVE
Protein, ur: NEGATIVE mg/dL
Specific Gravity, Urine: 1.031 — ABNORMAL HIGH (ref 1.005–1.030)
WBC, UA: >50 WBC/hpf — ABNORMAL HIGH (ref 0–5)
pH: 7 (ref 5.0–8.0)

## 2020-05-16 LAB — CBC
HCT: 35.7 % — ABNORMAL LOW (ref 36.0–46.0)
Hemoglobin: 10.9 g/dL — ABNORMAL LOW (ref 12.0–15.0)
MCH: 25.2 pg — ABNORMAL LOW (ref 26.0–34.0)
MCHC: 30.5 g/dL (ref 30.0–36.0)
MCV: 82.4 fL (ref 80.0–100.0)
Platelets: 296 10*3/uL (ref 150–400)
RBC: 4.33 MIL/uL (ref 3.87–5.11)
RDW: 15.6 % — ABNORMAL HIGH (ref 11.5–15.5)
WBC: 10.2 10*3/uL (ref 4.0–10.5)
nRBC: 0 % (ref 0.0–0.2)

## 2020-05-16 LAB — LIPASE, BLOOD: Lipase: 23 U/L (ref 11–51)

## 2020-05-16 MED ORDER — ONDANSETRON HCL 4 MG/2ML IJ SOLN
4.0000 mg | Freq: Four times a day (QID) | INTRAMUSCULAR | Status: DC | PRN
  Administered 2020-05-17 – 2020-05-18 (×3): 4 mg via INTRAVENOUS
  Filled 2020-05-16 (×3): qty 2

## 2020-05-16 MED ORDER — LABETALOL HCL 5 MG/ML IV SOLN
10.0000 mg | INTRAVENOUS | Status: DC | PRN
  Administered 2020-05-19: 10 mg via INTRAVENOUS
  Filled 2020-05-16 (×3): qty 4

## 2020-05-16 MED ORDER — PROMETHAZINE HCL 25 MG/ML IJ SOLN
12.5000 mg | Freq: Once | INTRAMUSCULAR | Status: AC
  Administered 2020-05-16: 12.5 mg via INTRAVENOUS
  Filled 2020-05-16: qty 1

## 2020-05-16 MED ORDER — PANTOPRAZOLE SODIUM 40 MG IV SOLR
40.0000 mg | Freq: Every day | INTRAVENOUS | Status: DC
  Administered 2020-05-17: 40 mg via INTRAVENOUS
  Filled 2020-05-16: qty 40

## 2020-05-16 MED ORDER — MORPHINE SULFATE (PF) 4 MG/ML IV SOLN
4.0000 mg | Freq: Once | INTRAVENOUS | Status: AC
  Administered 2020-05-16: 4 mg via INTRAVENOUS
  Filled 2020-05-16: qty 1

## 2020-05-16 MED ORDER — FAMOTIDINE IN NACL 20-0.9 MG/50ML-% IV SOLN
20.0000 mg | Freq: Once | INTRAVENOUS | Status: AC
  Administered 2020-05-16: 20 mg via INTRAVENOUS
  Filled 2020-05-16: qty 50

## 2020-05-16 MED ORDER — ALUM & MAG HYDROXIDE-SIMETH 200-200-20 MG/5ML PO SUSP
30.0000 mL | Freq: Once | ORAL | Status: AC
  Administered 2020-05-16: 30 mL via ORAL
  Filled 2020-05-16: qty 30

## 2020-05-16 MED ORDER — HYDROMORPHONE HCL 1 MG/ML IJ SOLN
1.0000 mg | Freq: Once | INTRAMUSCULAR | Status: AC
  Administered 2020-05-16: 1 mg via INTRAVENOUS
  Filled 2020-05-16: qty 1

## 2020-05-16 MED ORDER — ONDANSETRON HCL 4 MG/2ML IJ SOLN
4.0000 mg | Freq: Once | INTRAMUSCULAR | Status: AC
  Administered 2020-05-16: 4 mg via INTRAVENOUS
  Filled 2020-05-16: qty 2

## 2020-05-16 MED ORDER — SODIUM CHLORIDE (PF) 0.9 % IJ SOLN
INTRAMUSCULAR | Status: AC
  Filled 2020-05-16: qty 50

## 2020-05-16 MED ORDER — LIDOCAINE VISCOUS HCL 2 % MT SOLN
15.0000 mL | Freq: Once | OROMUCOSAL | Status: AC
  Administered 2020-05-16: 15 mL via ORAL
  Filled 2020-05-16: qty 15

## 2020-05-16 MED ORDER — ALBUTEROL SULFATE (2.5 MG/3ML) 0.083% IN NEBU: 2.5000 mg | INHALATION_SOLUTION | Freq: Four times a day (QID) | RESPIRATORY_TRACT | Status: DC | PRN

## 2020-05-16 MED ORDER — ALBUTEROL SULFATE HFA 108 (90 BASE) MCG/ACT IN AERS: 2.0000 | INHALATION_SPRAY | Freq: Four times a day (QID) | RESPIRATORY_TRACT | Status: DC | PRN

## 2020-05-16 MED ORDER — SODIUM CHLORIDE 0.9% FLUSH
3.0000 mL | Freq: Once | INTRAVENOUS | Status: AC
  Administered 2020-05-16: 3 mL via INTRAVENOUS

## 2020-05-16 MED ORDER — ONDANSETRON HCL 4 MG PO TABS: 4.0000 mg | ORAL_TABLET | Freq: Four times a day (QID) | ORAL | Status: DC | PRN

## 2020-05-16 MED ORDER — DEXTROSE-NACL 5-0.9 % IV SOLN: INTRAVENOUS | Status: AC

## 2020-05-16 MED ORDER — IOHEXOL 300 MG/ML  SOLN
100.0000 mL | Freq: Once | INTRAMUSCULAR | Status: AC | PRN
  Administered 2020-05-16: 100 mL via INTRAVENOUS

## 2020-05-16 MED ORDER — SODIUM CHLORIDE 0.9 % IV BOLUS
1000.0000 mL | Freq: Once | INTRAVENOUS | Status: AC
  Administered 2020-05-16: 1000 mL via INTRAVENOUS

## 2020-05-16 NOTE — Telephone Encounter (Signed)
Telephone call: 05/16/2020 1225  Patient called on-call service on memorial day:  Patient describes worsened nausea with vomiting and increased right upper quadrant pain over the past 12 hours.  Apparently scheduled for an upper GI series tomorrow for further evaluation from Dr. Silverio Decamp and wondering if she should proceed with this.  Also tells me she has run out of her Phenergan and that Tylenol is not touching her pain.  Denies fever.  Has been having okay bowel movements.  Discussed with the patient that I can send her in some Phenergan, but I will not prescribe her any pain medications.  I want her to pay close attention to her pain level and if it worsens over the next 8 hours or so or if she starts with fever/chills or worsening nausea/vomiting, then she should proceed to the urgent care/ER for further evaluation.  It could be that she is having acute cholecystitis given her knowledge of her gallstone.  Patient verbalized understanding.  We will cancel her upper GI for tomorrow.  This will need to be rescheduled pending her course over the next day or so.  Ellouise Newer, PA-C  [name]- can you cancel upper GI series for tomorrow, thanks-JLL

## 2020-05-16 NOTE — ED Provider Notes (Signed)
[facility] DEPT Provider Note   CSN: [id number] Arrival date & time: 05/16/20  1413     History Chief Complaint  Patient presents with  . Abdominal Pain  . Diarrhea    Morgan [name] is a 69 y.o. female with GERD, PUD, esophageal stricture, pyloric stenosis, chronic constipation who presents with abdominal pain. She states that the pain started yesterday out of nowhere. It is in the RUQ and constant and radiates across the upper abdomen and to the back. It feels like a "grabbing" pain. It's 8/10. She tried Tylenol without relief. She has had this pain intermittently since April and told that it was likely due to her gallbladder. She is prescribed Phenergan which she is out of. She has had 2 episodes of vomiting and reports alternating constipation and diarrhea. She takes Linzess for constipation. She ate some grilled chicken and potato salad around 11AM today and felt very nauseous and pain was severe so she decided to come to the ED. She denies fever, chills, chest pain, SOB, urinary symptoms. Past surgical hx significant for ex-lap, appendectomy, hysterectomy. She is supposed to have an EGD with dilation done for dysphagia problems in July. RUQ US done on May 10, 2020 showed a large stone in the gallbladder without evidence of cholecystitis.  HPI     Past Medical History:  Diagnosis Date  . Allergy   . Anemia   . Anxiety   . Arthritis   . Depression   . GERD (gastroesophageal reflux disease)   . Hypertension   . MVP (mitral valve prolapse)     Patient Active Problem List   Diagnosis Date Noted  . Oxygen dependent 03/30/2020  . Nausea 03/22/2020  . Peptic ulcer disease 03/22/2020  . Iron deficiency anemia 03/22/2020  . Acute pain of right knee 12/29/2019  . Acute left-sided low back pain without sciatica 12/29/2019  . Fall 04/08/2019  . Abdominal pain 02/19/2019  . Viral gastroenteritis 02/19/2019  . Asymptomatic bacteriuria 02/19/2019  .  COPD exacerbation ([place]) 02/16/2019  . CKD (chronic kidney disease) stage 3, GFR 30-59 ml/min 01/13/2019  . CHF (congestive heart failure) ([place]) 01/13/2019  . Dizziness 01/13/2019  . Hoarseness 11/12/2018  . Dysphagia 11/12/2018  . Sleepiness 11/12/2018  . CVA (cerebral vascular accident) ([place]) 08/22/2018  . Left-sided weakness 10/04/2017  . HLD (hyperlipidemia) 10/04/2017  . GERD (gastroesophageal reflux disease) 10/04/2017  . Anxiety 08/26/2017  . Essential hypertension   . Depression   . Diarrhea 02/22/2015  . Hypokalemia 02/22/2015  . Chronic lower back pain 04/29/2013  . Paresthesia 04/29/2013    Past Surgical History:  Procedure Laterality Date  . ABDOMINAL HYSTERECTOMY    . APPENDECTOMY    . BALLOON DILATION N/A 04/21/2020   Procedure: BALLOON DILATION;  Surgeon: Milus Banister, MD;  Location: [facility];  Service: Endoscopy;  Laterality: N/A;  pyloric/ GI  . BIOPSY  04/21/2020   Procedure: BIOPSY;  Surgeon: Milus Banister, MD;  Location: [facility];  Service: Endoscopy;;  . BREAST SURGERY Right 1988   tumor removal  . ESOPHAGOGASTRODUODENOSCOPY (EGD) WITH PROPOFOL N/A 04/21/2020   Procedure: ESOPHAGOGASTRODUODENOSCOPY (EGD) WITH PROPOFOL;  Surgeon: Milus Banister, MD;  Location: [facility];  Service: Endoscopy;  Laterality: N/A;  . KNEE SURGERY  2005   2 times left  . TUBAL LIGATION       OB History   No obstetric history on file.     Family History  Problem Relation Age of Onset  .  Breast cancer Mother 35  . Ovarian cancer Mother 63  . Brain cancer Mother   . Hypertension Father   . Hyperlipidemia Father   . Heart disease Father   . Breast cancer Maternal Grandmother 53  . Diabetes Paternal Grandmother   . Breast cancer Paternal Grandmother 23  . Cancer Paternal Grandfather   . Breast cancer Maternal Aunt 60  . Breast cancer Maternal Aunt 89    Social History   Tobacco Use  . Smoking status: Former Smoker    Packs/day: 1.00    Years: 15.00     Pack years: 15.00    Types: Cigarettes    Quit date: 09/12/2017    Years since quitting: 2.6  . Smokeless tobacco: Never Used  Substance Use Topics  . Alcohol use: Not Currently  . Drug use: No    Home Medications Prior to Admission medications   Medication Sig Start Date End Date Taking? Authorizing Provider  albuterol (VENTOLIN HFA) 108 (90 Base) MCG/ACT inhaler TAKE 2 PUFFS BY MOUTH EVERY 6 HOURS AS NEEDED FOR WHEEZE OR SHORTNESS OF BREATH 02/22/20   [name], MD  aspirin 81 MG EC tablet Take 81 mg by mouth daily. daily    [provider]  aspirin-acetaminophen-caffeine (EXCEDRIN MIGRAINE) [id number] MG tablet Take 2 tablets by mouth every 6 (six) hours as needed for headache.    [provider]  atorvastatin (LIPITOR) 40 MG tablet Take 40 mg by mouth daily.     [provider]  carvedilol (COREG) 3.125 MG tablet Take 3.125 mg by mouth in the morning and at bedtime.    [provider]  colestipol (COLESTID) 1 g tablet TAKE 1 TABLET (1 G TOTAL) BY MOUTH 2 (TWO) TIMES DAILY. 07/20/19   Mauri Pole, MD  dexlansoprazole (DEXILANT) 60 MG capsule Take 1 capsule (60 mg total) by mouth daily. 05/04/20   Mauri Pole, MD  esomeprazole (NEXIUM) 40 MG capsule Take 1 capsule by mouth twice daily. Patient taking differently: Take 40 mg by mouth 2 (two) times daily.  03/30/20   Esterwood, Amy S, PA-C  Ferrous Sulfate (IRON) 325 (65 Fe) MG TABS Take 1 tablet (325 mg total) by mouth daily. 04/09/19   [name], MD  FLUoxetine (PROZAC) 40 MG capsule Take 40 mg by mouth daily.    [provider]  furosemide (LASIX) 40 MG tablet HOLD UNTIL seen by your doctor Patient taking differently: Take 40 mg by mouth daily.  02/19/19   Oretha Milch D, MD  gabapentin (NEURONTIN) 400 MG capsule Take 400 mg by mouth 3 (three) times daily.  10/20/18   [provider]  hydrOXYzine (ATARAX/VISTARIL) 25 MG tablet Take 12.5-25 mg by mouth 3 (three)  times daily as needed for anxiety.    [provider]  linaclotide Rolan Lipa) 145 MCG CAPS capsule Take 1 capsule (145 mcg total) by mouth daily before breakfast. 05/04/20   Nandigam, Venia Minks, MD  mirtazapine (REMERON) 45 MG tablet Take 45 mg by mouth at bedtime.     [provider]  Na Sulfate-K Sulfate-Mg Sulf (SUPREP BOWEL PREP KIT) 17.5-3.13-1.6 GM/177ML SOLN Take 1 kit by mouth as directed. 05/04/20   Mauri Pole, MD  OXYGEN Inhale into the lungs. 02 at bedtime    [provider]  promethazine (PHENERGAN) 25 MG tablet Take 1 tablet (25 mg total) by mouth daily as needed for nausea or vomiting. 05/04/20   Mauri Pole, MD  sucralfate (CARAFATE) 1  g tablet Take 1 tablet (1 g total) by mouth 4 (four) times daily -  with meals and at bedtime. 05/04/20   Mauri Pole, MD    Allergies    Demerol [meperidine], Strawberry (diagnostic), and Wasp venom  Review of Systems   Review of Systems  Constitutional: Negative for chills and fever.  Respiratory: Negative for shortness of breath.   Cardiovascular: Negative for chest pain.  Gastrointestinal: Positive for abdominal pain, constipation, diarrhea, nausea and vomiting.  Genitourinary: Negative for difficulty urinating, dysuria, flank pain and hematuria.  All other systems reviewed and are negative.   Physical Exam Updated Vital Signs BP (!) 163/90   Pulse 60   Temp 98.2 F (36.8 C)   Resp 16   SpO2 98%   Physical Exam Vitals and nursing note reviewed.  Constitutional:      General: She is not in acute distress.    Appearance: She is well-developed.     Comments: Uncomfortable appearing. Cooperative. NAD  HENT:     Head: Normocephalic and atraumatic.  Eyes:     General: No scleral icterus.       Right eye: No discharge.        Left eye: No discharge.     Conjunctiva/sclera: Conjunctivae normal.     Pupils: Pupils are equal, round, and reactive to light.  Cardiovascular:     Rate and  Rhythm: Normal rate and regular rhythm.  Pulmonary:     Effort: Pulmonary effort is normal. No respiratory distress.     Breath sounds: Normal breath sounds.  Abdominal:     General: There is no distension.     Palpations: Abdomen is soft.     Tenderness: There is abdominal tenderness (epigastric and RUQ).  Musculoskeletal:     Cervical back: Normal range of motion.  Skin:    General: Skin is warm and dry.  Neurological:     Mental Status: She is alert and oriented to person, place, and time.  Psychiatric:        Behavior: Behavior normal.     ED Results / Procedures / Treatments   Labs (all labs ordered are listed, but only abnormal results are displayed) Labs Reviewed  COMPREHENSIVE METABOLIC PANEL - Abnormal; Notable for the following components:      Result Value   Glucose, Bld 114 (*)    Calcium 8.6 (*)    Albumin 3.4 (*)    All other components within normal limits  CBC - Abnormal; Notable for the following components:   Hemoglobin 10.9 (*)    HCT 35.7 (*)    MCH 25.2 (*)    RDW 15.6 (*)    All other components within normal limits  URINALYSIS, ROUTINE W REFLEX MICROSCOPIC - Abnormal; Notable for the following components:   APPearance HAZY (*)    Specific Gravity, Urine 1.031 (*)    Hgb urine dipstick SMALL (*)    Leukocytes,Ua LARGE (*)    WBC, UA >50 (*)    Bacteria, UA RARE (*)    All other components within normal limits  URINE CULTURE  LIPASE, BLOOD    EKG None  Radiology CT ABDOMEN PELVIS W CONTRAST  Result Date: 05/16/2020 CLINICAL DATA:  69 year old female with right upper quadrant abdominal pain. EXAM: CT ABDOMEN AND PELVIS WITH CONTRAST TECHNIQUE: Multidetector CT imaging of the abdomen and pelvis was performed using the standard protocol following bolus administration of intravenous contrast. CONTRAST:  12m OMNIPAQUE IOHEXOL 300 MG/ML  SOLN COMPARISON:  CT abdomen pelvis dated 03/28/2020. FINDINGS: Lower chest: The visualized lung bases are  clear. No intra-abdominal free air or free fluid. Hepatobiliary: The liver is unremarkable. No intrahepatic biliary ductal dilatation. There is a gallstone. No pericholecystic fluid or evidence of acute cholecystitis. Pancreas: Unremarkable. No pancreatic ductal dilatation or surrounding inflammatory changes. Spleen: Normal in size without focal abnormality. Adrenals/Urinary Tract: The adrenal glands are unremarkable. There is no hydronephrosis on either side. There is symmetric enhancement and excretion of contrast by both kidneys. The visualized ureters and urinary bladder appear unremarkable. Stomach/Bowel: There is sigmoid diverticulosis and several scattered colonic diverticula without active inflammatory changes. There is no bowel obstruction or active inflammation. Appendectomy. Vascular/Lymphatic: Mild aortoiliac atherosclerotic disease. The IVC is unremarkable. No portal venous gas. There is no adenopathy. Reproductive: Hysterectomy. Other: None Musculoskeletal: No acute osseous pathology. IMPRESSION: 1. No acute intra-abdominal or pelvic pathology. 2. Cholelithiasis. 3. Colonic diverticulosis. 4. Aortic Atherosclerosis (ICD10-I70.0). Electronically Signed   By: Anner Crete M.D.   On: 05/16/2020 18:46   US Abdomen Limited RUQ  Result Date: 05/16/2020 CLINICAL DATA:  Right upper quadrant pain EXAM: ULTRASOUND ABDOMEN LIMITED RIGHT UPPER QUADRANT COMPARISON:  05/06/2020 FINDINGS: Gallbladder: No gallstones. Top normal gallbladder wall thickness. No sonographic Murphy sign noted by sonographer. Common bile duct: Diameter: 3.9 mm Liver: No focal lesion identified. Increased hepatic parenchymal echogenicity. Portal vein is patent on color Doppler imaging with normal direction of blood flow towards the liver. Other: None. IMPRESSION: 1. No cholelithiasis or sonographic evidence of acute cholecystitis. Previously demonstrated 10 mm gallstone is not visualized on the current examination and may reflect  recent passage of the stone. 2. Increased hepatic echogenicity as can be seen with hepatic steatosis. Electronically Signed   By: Kathreen Devoid   On: 05/16/2020 16:37    Procedures Procedures (including critical care time)  Medications Ordered in ED Medications  sodium chloride (PF) 0.9 % injection (has no administration in time range)  sodium chloride flush (NS) 0.9 % injection 3 mL (3 mLs Intravenous Given 05/16/20 1812)  sodium chloride 0.9 % bolus 1,000 mL (0 mLs Intravenous Stopped 05/16/20 1939)  morphine 4 MG/ML injection 4 mg (4 mg Intravenous Given 05/16/20 1811)  ondansetron (ZOFRAN) injection 4 mg (4 mg Intravenous Given 05/16/20 1811)  iohexol (OMNIPAQUE) 300 MG/ML solution 100 mL (100 mLs Intravenous Contrast Given 05/16/20 1816)  morphine 4 MG/ML injection 4 mg (4 mg Intravenous Given 05/16/20 1938)  promethazine (PHENERGAN) injection 12.5 mg (12.5 mg Intravenous Given 05/16/20 1940)  HYDROmorphone (DILAUDID) injection 1 mg (1 mg Intravenous Given 05/16/20 2140)  famotidine (PEPCID) IVPB 20 mg premix (20 mg Intravenous New Bag/Given 05/16/20 2221)  alum & mag hydroxide-simeth (MAALOX/MYLANTA) 200-200-20 MG/5ML suspension 30 mL (30 mLs Oral Given 05/16/20 2223)    And  lidocaine (XYLOCAINE) 2 % viscous mouth solution 15 mL (15 mLs Oral Given 05/16/20 2223)    ED Course  I have reviewed the triage vital signs and the nursing notes.  Pertinent labs & imaging results that were available during my care of the patient were reviewed by me and considered in my medical decision making (see chart for details).  69 year old who presents with RUQ/epigastric abdominal pain that has been constant for 1 day but intermittent for months. Vitals are reassuring. She is tender in the epigastric and RUQ on exam. CBC shows mild anemia. CMP and lipase are reassuring. UA has small hgb, large leukocytes, rare bacteria but appears contaminated and she has no urinary symptoms. Will  send off urine culture. RUQ [place]  and pain control ordered.  [place] is negative. Previously seen stone is not visualized. Will order CT A/P.   CT shows cholelithiasis but no cholecystitis or any other acute findings. Pain has been uncontrolled with multiple rounds of pain meds. Shared visit with Dr. Stark Jock. Will discuss with GI.  Discussed with Dr. Lyndel Safe who reviewed work up. He recommends getting HIDA scan and admission for pain control. He would like her to be NPO after midnight.  Discussed with Dr. Hal Hope who will admit.   MDM Rules/Calculators/A&P   Final Clinical Impression(s) / ED Diagnoses Final diagnoses:  RUQ pain    Rx / DC Orders ED Discharge Orders    None       Recardo Evangelist, PA-C 05/17/20 0008    Veryl Speak, MD 05/19/20 [phone]

## 2020-05-16 NOTE — ED Notes (Signed)
Pt provided labeled specimen cup for urine collection. ENMiles 

## 2020-05-16 NOTE — H&P (Signed)
History and Physical    Morgan [name] ZJQ:[id number] DOB: December 03, 1951 DOA: 05/16/2020  PCP: [name], MD  Patient coming from: Home.  Chief Complaint: Abdominal pain.  HPI: Morgan [name] is a 69 y.o. female with history of chronic abdominal pain hypertension follows up with [facility] had recent EGD done on Apr 21, 2020 which showed benign-appearing pyloric stenosis for which patient underwent dilatation and had subsequently followed up with gastroenterologist presents to the ER because of worsening abdominal pain last couple of days with some vomiting.  Pain is mostly localized to the right upper quadrant.  Has chronic constipation does not recall her last bowel movement.  Denies any fever chills.  ED Course: In the ER patient had CT abdomen and pelvis and sonogram of the abdomen which both showed cholelithiasis with no definite evidence of cholecystitis.  On exam patient has right upper quadrant tenderness.  LFTs are normal with normal WBC count patient afebrile.  UA shows features concerning for UTI.  ER physician discussed with on-call gastroenterologist for [place] Dr. Lyndel Safe who at this time advised keeping patient n.p.o. and will be seeing patient in consult and HIDA scan to be ordered.  Covid test is pending.  Review of Systems: As per HPI, rest all negative.   Past Medical History:  Diagnosis Date  . Allergy   . Anemia   . Anxiety   . Arthritis   . Depression   . GERD (gastroesophageal reflux disease)   . Hypertension   . MVP (mitral valve prolapse)     Past Surgical History:  Procedure Laterality Date  . ABDOMINAL HYSTERECTOMY    . APPENDECTOMY    . BALLOON DILATION N/A 04/21/2020   Procedure: BALLOON DILATION;  Surgeon: Milus Banister, MD;  Location: [facility];  Service: Endoscopy;  Laterality: N/A;  pyloric/ GI  . BIOPSY  04/21/2020   Procedure: BIOPSY;  Surgeon: Milus Banister, MD;  Location: [facility];  Service: Endoscopy;;  . BREAST SURGERY Right 1988    tumor removal  . ESOPHAGOGASTRODUODENOSCOPY (EGD) WITH PROPOFOL N/A 04/21/2020   Procedure: ESOPHAGOGASTRODUODENOSCOPY (EGD) WITH PROPOFOL;  Surgeon: Milus Banister, MD;  Location: [facility];  Service: Endoscopy;  Laterality: N/A;  . KNEE SURGERY  2005   2 times left  . TUBAL LIGATION       reports that she quit smoking about 2 years ago. Her smoking use included cigarettes. She has a 15.00 pack-year smoking history. She has never used smokeless tobacco. She reports previous alcohol use. She reports that she does not use drugs.  Allergies  Allergen Reactions  . Demerol [Meperidine] Hives  . Strawberry (Diagnostic) Hives  . Wasp Venom Hives    Family History  Problem Relation Age of Onset  . Breast cancer Mother 10  . Ovarian cancer Mother 69  . Brain cancer Mother   . Hypertension Father   . Hyperlipidemia Father   . Heart disease Father   . Breast cancer Maternal Grandmother 86  . Diabetes Paternal Grandmother   . Breast cancer Paternal Grandmother 56  . Cancer Paternal Grandfather   . Breast cancer Maternal Aunt 60  . Breast cancer Maternal Aunt 60    Prior to Admission medications   Medication Sig Start Date End Date Taking? Authorizing Provider  albuterol (VENTOLIN HFA) 108 (90 Base) MCG/ACT inhaler TAKE 2 PUFFS BY MOUTH EVERY 6 HOURS AS NEEDED FOR WHEEZE OR SHORTNESS OF BREATH Patient taking differently: Inhale 2 puffs into the lungs every  6 (six) hours as needed for wheezing or shortness of breath.  02/22/20  Yes [name], MD  aspirin 81 MG EC tablet Take 81 mg by mouth daily. daily   Yes [provider]  aspirin-acetaminophen-caffeine (EXCEDRIN MIGRAINE) [phone] MG tablet Take 2 tablets by mouth every 6 (six) hours as needed for headache.   Yes [provider]  atorvastatin (LIPITOR) 40 MG tablet Take 40 mg by mouth daily.    Yes [provider]  carvedilol (COREG) 3.125 MG tablet Take 3.125 mg by mouth in the morning and at  bedtime.   Yes [provider]  colestipol (COLESTID) 1 g tablet TAKE 1 TABLET (1 G TOTAL) BY MOUTH 2 (TWO) TIMES DAILY. 07/20/19  Yes Nandigam, Venia Minks, MD  dexlansoprazole (DEXILANT) 60 MG capsule Take 1 capsule (60 mg total) by mouth daily. 05/04/20  Yes Nandigam, Venia Minks, MD  esomeprazole (NEXIUM) 40 MG capsule Take 1 capsule by mouth twice daily. Patient taking differently: Take 40 mg by mouth 2 (two) times daily.  03/30/20  Yes Esterwood, Amy S, PA-C  FLUoxetine (PROZAC) 40 MG capsule Take 40 mg by mouth daily.   Yes [provider]  furosemide (LASIX) 40 MG tablet HOLD UNTIL seen by your doctor Patient taking differently: Take 40 mg by mouth daily.  02/19/19  Yes Oretha Milch D, MD  gabapentin (NEURONTIN) 400 MG capsule Take 400 mg by mouth 3 (three) times daily.  10/20/18  Yes [provider]  hydrOXYzine (ATARAX/VISTARIL) 25 MG tablet Take 12.5-25 mg by mouth 3 (three) times daily as needed for anxiety.   Yes [provider]  linaclotide Rolan Lipa) 145 MCG CAPS capsule Take 1 capsule (145 mcg total) by mouth daily before breakfast. 05/04/20  Yes Nandigam, Venia Minks, MD  mirtazapine (REMERON) 45 MG tablet Take 45 mg by mouth at bedtime.    Yes [provider]  promethazine (PHENERGAN) 25 MG tablet Take 1 tablet (25 mg total) by mouth daily as needed for nausea or vomiting. 05/04/20  Yes Nandigam, Venia Minks, MD  Ferrous Sulfate (IRON) 325 (65 Fe) MG TABS Take 1 tablet (325 mg total) by mouth daily. Patient not taking: Reported on 05/16/2020 04/09/19   [name], MD  Na Sulfate-K Sulfate-Mg Sulf (SUPREP BOWEL PREP KIT) 17.5-3.13-1.6 GM/177ML SOLN Take 1 kit by mouth as directed. Patient not taking: Reported on 05/16/2020 05/04/20   Mauri Pole, MD  OXYGEN Inhale into the lungs. 02 at bedtime    [provider]  sucralfate (CARAFATE) 1 g tablet Take 1 tablet (1 g total) by mouth 4 (four) times daily -  with meals and at bedtime.  05/04/20   Mauri Pole, MD    Physical Exam: Constitutional: Moderately built and nourished. Vitals:   05/16/20 2030 05/16/20 2130 05/16/20 2230 05/16/20 2300  BP: (!) 163/86 (!) 163/90 131/78   Pulse: (!) 58 60 (!) 59   Resp: _0 Temp:      SpO2: 95% 98% 97%    Eyes: Anicteric no pallor. ENMT: No discharge from the ears eyes nose or mouth. Neck: No mass felt.  No neck rigidity. Respiratory: No rhonchi or crepitations. Cardiovascular: S1-S2 heard. Abdomen: Right upper quadrant tenderness no guarding or rigidity. Musculoskeletal: No edema. Skin: No rash. Neurologic: Alert awake oriented to time place and person.  Moves all extremities. Psychiatric: Appears normal per normal affect.   Labs on Admission: I have personally reviewed following labs and imaging studies  CBC: Recent Labs  Lab 05/16/20 1442  WBC 10.2  HGB 10.9*  HCT 35.7*  MCV 82.4  PLT 924   Basic Metabolic Panel: Recent Labs  Lab 05/16/20 1442  NA 141  K 4.1  CL 103  CO2 29  GLUCOSE 114*  BUN 22  CREATININE 0.95  CALCIUM 8.6*   GFR: Estimated Creatinine Clearance: 82 mL/min (by C-G formula based on SCr of 0.95 mg/dL). Liver Function Tests: Recent Labs  Lab 05/16/20 1442  AST 16  ALT 13  ALKPHOS 109  BILITOT 0.5  PROT 6.8  ALBUMIN 3.4*   Recent Labs  Lab 05/16/20 1442  LIPASE 23   No results for input(s): AMMONIA in the last 168 hours. Coagulation Profile: No results for input(s): INR, PROTIME in the last 168 hours. Cardiac Enzymes: No results for input(s): CKTOTAL, CKMB, CKMBINDEX, TROPONINI in the last 168 hours. BNP (last 3 results) No results for input(s): PROBNP in the last 8760 hours. HbA1C: No results for input(s): HGBA1C in the last 72 hours. CBG: No results for input(s): GLUCAP in the last 168 hours. Lipid Profile: No results for input(s): CHOL, HDL, LDLCALC, TRIG, CHOLHDL, LDLDIRECT in the last 72 hours. Thyroid Function Tests: No results for input(s):  TSH, T4TOTAL, FREET4, T3FREE, THYROIDAB in the last 72 hours. Anemia Panel: No results for input(s): VITAMINB12, FOLATE, FERRITIN, TIBC, IRON, RETICCTPCT in the last 72 hours. Urine analysis:    Component Value Date/Time   COLORURINE YELLOW 05/16/2020 2100   APPEARANCEUR HAZY (A) 05/16/2020 2100   APPEARANCEUR Cloudy 09/18/2012 2000   LABSPEC 1.031 (H) 05/16/2020 2100   LABSPEC 1.012 09/18/2012 2000   PHURINE 7.0 05/16/2020 2100   GLUCOSEU NEGATIVE 05/16/2020 2100   GLUCOSEU Negative 09/18/2012 2000   HGBUR SMALL (A) 05/16/2020 2100   BILIRUBINUR NEGATIVE 05/16/2020 2100   BILIRUBINUR negative 04/08/2019 1103   BILIRUBINUR Negative 09/18/2012 2000   KETONESUR NEGATIVE 05/16/2020 2100   PROTEINUR NEGATIVE 05/16/2020 2100   UROBILINOGEN 0.2 04/08/2019 1103   UROBILINOGEN 0.2 05/25/2017 1743   NITRITE NEGATIVE 05/16/2020 2100   LEUKOCYTESUR LARGE (A) 05/16/2020 2100   LEUKOCYTESUR Trace 09/18/2012 2000   Sepsis Labs: _0 (procalcitonin:4,lacticidven:4) )No results found for this or any previous visit (from the past 240 hour(s)).   Radiological Exams on Admission: CT ABDOMEN PELVIS W CONTRAST  Result Date: 05/16/2020 CLINICAL DATA:  70 year old female with right upper quadrant abdominal pain. EXAM: CT ABDOMEN AND PELVIS WITH CONTRAST TECHNIQUE: Multidetector CT imaging of the abdomen and pelvis was performed using the standard protocol following bolus administration of intravenous contrast. CONTRAST:  126m OMNIPAQUE IOHEXOL 300 MG/ML  SOLN COMPARISON:  CT abdomen pelvis dated 03/28/2020. FINDINGS: Lower chest: The visualized lung bases are clear. No intra-abdominal free air or free fluid. Hepatobiliary: The liver is unremarkable. No intrahepatic biliary ductal dilatation. There is a gallstone. No pericholecystic fluid or evidence of acute cholecystitis. Pancreas: Unremarkable. No pancreatic ductal dilatation or surrounding inflammatory changes. Spleen: Normal in size without focal  abnormality. Adrenals/Urinary Tract: The adrenal glands are unremarkable. There is no hydronephrosis on either side. There is symmetric enhancement and excretion of contrast by both kidneys. The visualized ureters and urinary bladder appear unremarkable. Stomach/Bowel: There is sigmoid diverticulosis and several scattered colonic diverticula without active inflammatory changes. There is no bowel obstruction or active inflammation. Appendectomy. Vascular/Lymphatic: Mild aortoiliac atherosclerotic disease. The IVC is unremarkable. No portal venous gas. There is no adenopathy. Reproductive: Hysterectomy. Other: None Musculoskeletal: No acute osseous pathology. IMPRESSION: 1. No acute intra-abdominal or pelvic  pathology. 2. Cholelithiasis. 3. Colonic diverticulosis. 4. Aortic Atherosclerosis (ICD10-I70.0). Electronically Signed   By: Anner Crete M.D.   On: 05/16/2020 18:46   US Abdomen Limited RUQ  Result Date: 05/16/2020 CLINICAL DATA:  Right upper quadrant pain EXAM: ULTRASOUND ABDOMEN LIMITED RIGHT UPPER QUADRANT COMPARISON:  05/06/2020 FINDINGS: Gallbladder: No gallstones. Top normal gallbladder wall thickness. No sonographic Murphy sign noted by sonographer. Common bile duct: Diameter: 3.9 mm Liver: No focal lesion identified. Increased hepatic parenchymal echogenicity. Portal vein is patent on color Doppler imaging with normal direction of blood flow towards the liver. Other: None. IMPRESSION: 1. No cholelithiasis or sonographic evidence of acute cholecystitis. Previously demonstrated 10 mm gallstone is not visualized on the current examination and may reflect recent passage of the stone. 2. Increased hepatic echogenicity as can be seen with hepatic steatosis. Electronically Signed   By: Kathreen Devoid   On: 05/16/2020 16:37    EKG: Independently reviewed.  Normal sinus rhythm.  Assessment/Plan Principal Problem:   Abdominal pain Active Problems:   Essential hypertension   CKD (chronic kidney  disease) stage 3, GFR 30-59 ml/min    1. Right upper quadrant pain cause not clear has some cholelithiasis with no definite evidence of cholecystitis.  Will get HIDA scan gastroenterology has been consulted keep patient n.p.o.  Patient has had underwent recent EGD which showed benign-appearing pelvic stenosis and underwent dilatation.  Patient is on IV fluids pain relief medications further recommendation per GI. 2. Hypertension we will keep patient as needed IV hydralazine. 3. Anemia on iron supplements.  Has chronic iron deficiency anemia.  Follow CBC. 4. History of chronic constipation.  Covid test is pending.   DVT prophylaxis: SCDs.  Anticipation of procedure. Code Status: Full code. Family Communication: Discussed with patient. Disposition Plan: Home. Consults called:  gastroenterologist. Admission status: Observation.   Rise Patience MD [facility] Pager [phone].  If 7PM-7AM, please contact night-coverage [email or web address] Password [email or web address]  05/16/2020, 11:46 PM

## 2020-05-16 NOTE — ED Notes (Signed)
Multiple unsuccessful IV start attempts. IV Team notified will establish as soon as possible.

## 2020-05-16 NOTE — ED Triage Notes (Signed)
Pt c/o abd pains with nausea and diarrhea. Reports has enlarged gallstone and PCP advised to go to Ed for further evaluation if symptoms got worse.

## 2020-05-17 ENCOUNTER — Observation Stay (HOSPITAL_COMMUNITY): Payer: Medicare HMO

## 2020-05-17 ENCOUNTER — Inpatient Hospital Stay (HOSPITAL_COMMUNITY): Admission: RE | Admit: 2020-05-17 | Payer: Medicare HMO | Source: Ambulatory Visit

## 2020-05-17 DIAGNOSIS — F419 Anxiety disorder, unspecified: Secondary | ICD-10-CM | POA: Diagnosis present

## 2020-05-17 DIAGNOSIS — Z885 Allergy status to narcotic agent status: Secondary | ICD-10-CM | POA: Diagnosis not present

## 2020-05-17 DIAGNOSIS — Z9981 Dependence on supplemental oxygen: Secondary | ICD-10-CM | POA: Diagnosis not present

## 2020-05-17 DIAGNOSIS — R1011 Right upper quadrant pain: Secondary | ICD-10-CM

## 2020-05-17 DIAGNOSIS — N183 Chronic kidney disease, stage 3 unspecified: Secondary | ICD-10-CM | POA: Diagnosis not present

## 2020-05-17 DIAGNOSIS — D509 Iron deficiency anemia, unspecified: Secondary | ICD-10-CM | POA: Diagnosis present

## 2020-05-17 DIAGNOSIS — Z83438 Family history of other disorder of lipoprotein metabolism and other lipidemia: Secondary | ICD-10-CM | POA: Diagnosis not present

## 2020-05-17 DIAGNOSIS — I509 Heart failure, unspecified: Secondary | ICD-10-CM | POA: Diagnosis present

## 2020-05-17 DIAGNOSIS — K222 Esophageal obstruction: Secondary | ICD-10-CM | POA: Diagnosis present

## 2020-05-17 DIAGNOSIS — K8020 Calculus of gallbladder without cholecystitis without obstruction: Secondary | ICD-10-CM

## 2020-05-17 DIAGNOSIS — I130 Hypertensive heart and chronic kidney disease with heart failure and stage 1 through stage 4 chronic kidney disease, or unspecified chronic kidney disease: Secondary | ICD-10-CM | POA: Diagnosis present

## 2020-05-17 DIAGNOSIS — R112 Nausea with vomiting, unspecified: Secondary | ICD-10-CM | POA: Diagnosis not present

## 2020-05-17 DIAGNOSIS — Z20822 Contact with and (suspected) exposure to covid-19: Secondary | ICD-10-CM | POA: Diagnosis present

## 2020-05-17 DIAGNOSIS — N1831 Chronic kidney disease, stage 3a: Secondary | ICD-10-CM | POA: Diagnosis present

## 2020-05-17 DIAGNOSIS — I10 Essential (primary) hypertension: Secondary | ICD-10-CM | POA: Diagnosis not present

## 2020-05-17 DIAGNOSIS — K8000 Calculus of gallbladder with acute cholecystitis without obstruction: Secondary | ICD-10-CM | POA: Diagnosis not present

## 2020-05-17 DIAGNOSIS — F418 Other specified anxiety disorders: Secondary | ICD-10-CM | POA: Diagnosis present

## 2020-05-17 DIAGNOSIS — Z1612 Extended spectrum beta lactamase (ESBL) resistance: Secondary | ICD-10-CM | POA: Diagnosis present

## 2020-05-17 DIAGNOSIS — J449 Chronic obstructive pulmonary disease, unspecified: Secondary | ICD-10-CM | POA: Diagnosis present

## 2020-05-17 DIAGNOSIS — K219 Gastro-esophageal reflux disease without esophagitis: Secondary | ICD-10-CM | POA: Diagnosis present

## 2020-05-17 DIAGNOSIS — Z8249 Family history of ischemic heart disease and other diseases of the circulatory system: Secondary | ICD-10-CM | POA: Diagnosis not present

## 2020-05-17 DIAGNOSIS — K5904 Chronic idiopathic constipation: Secondary | ICD-10-CM | POA: Diagnosis present

## 2020-05-17 DIAGNOSIS — Z91018 Allergy to other foods: Secondary | ICD-10-CM | POA: Diagnosis not present

## 2020-05-17 DIAGNOSIS — Z79899 Other long term (current) drug therapy: Secondary | ICD-10-CM | POA: Diagnosis not present

## 2020-05-17 DIAGNOSIS — K8012 Calculus of gallbladder with acute and chronic cholecystitis without obstruction: Secondary | ICD-10-CM | POA: Diagnosis present

## 2020-05-17 DIAGNOSIS — F329 Major depressive disorder, single episode, unspecified: Secondary | ICD-10-CM | POA: Diagnosis present

## 2020-05-17 DIAGNOSIS — Z833 Family history of diabetes mellitus: Secondary | ICD-10-CM | POA: Diagnosis not present

## 2020-05-17 DIAGNOSIS — Z87891 Personal history of nicotine dependence: Secondary | ICD-10-CM | POA: Diagnosis not present

## 2020-05-17 DIAGNOSIS — K811 Chronic cholecystitis: Secondary | ICD-10-CM | POA: Diagnosis not present

## 2020-05-17 DIAGNOSIS — B9620 Unspecified Escherichia coli [E. coli] as the cause of diseases classified elsewhere: Secondary | ICD-10-CM | POA: Diagnosis present

## 2020-05-17 DIAGNOSIS — Z7982 Long term (current) use of aspirin: Secondary | ICD-10-CM | POA: Diagnosis not present

## 2020-05-17 LAB — CBC
HCT: 34.8 % — ABNORMAL LOW (ref 36.0–46.0)
Hemoglobin: 10.2 g/dL — ABNORMAL LOW (ref 12.0–15.0)
MCH: 24.9 pg — ABNORMAL LOW (ref 26.0–34.0)
MCHC: 29.3 g/dL — ABNORMAL LOW (ref 30.0–36.0)
MCV: 85.1 fL (ref 80.0–100.0)
Platelets: 270 10*3/uL (ref 150–400)
RBC: 4.09 MIL/uL (ref 3.87–5.11)
RDW: 15.9 % — ABNORMAL HIGH (ref 11.5–15.5)
WBC: 9.2 10*3/uL (ref 4.0–10.5)
nRBC: 0 % (ref 0.0–0.2)

## 2020-05-17 LAB — BASIC METABOLIC PANEL WITH GFR
Anion gap: 7 (ref 5–15)
BUN: 20 mg/dL (ref 8–23)
CO2: 31 mmol/L (ref 22–32)
Calcium: 8.3 mg/dL — ABNORMAL LOW (ref 8.9–10.3)
Chloride: 105 mmol/L (ref 98–111)
Creatinine, Ser: 0.96 mg/dL (ref 0.44–1.00)
GFR calc Af Amer: >60 mL/min
GFR calc non Af Amer: >60 mL/min
Glucose, Bld: 123 mg/dL — ABNORMAL HIGH (ref 70–99)
Potassium: 3.8 mmol/L (ref 3.5–5.1)
Sodium: 143 mmol/L (ref 135–145)

## 2020-05-17 LAB — HEPATIC FUNCTION PANEL
ALT: 13 U/L (ref 0–44)
AST: 14 U/L — ABNORMAL LOW (ref 15–41)
Albumin: 3.5 g/dL (ref 3.5–5.0)
Alkaline Phosphatase: 103 U/L (ref 38–126)
Bilirubin, Direct: <0.1 mg/dL (ref 0.0–0.2)
Total Bilirubin: 0.5 mg/dL (ref 0.3–1.2)
Total Protein: 6.7 g/dL (ref 6.5–8.1)

## 2020-05-17 LAB — SARS CORONAVIRUS 2 BY RT PCR (HOSPITAL ORDER, PERFORMED IN ~~LOC~~ HOSPITAL LAB): SARS Coronavirus 2: NEGATIVE

## 2020-05-17 LAB — HIV ANTIBODY (ROUTINE TESTING W REFLEX): HIV Screen 4th Generation wRfx: NONREACTIVE

## 2020-05-17 LAB — GLUCOSE, CAPILLARY
Glucose-Capillary: 101 mg/dL — ABNORMAL HIGH (ref 70–99)
Glucose-Capillary: 107 mg/dL — ABNORMAL HIGH (ref 70–99)

## 2020-05-17 MED ORDER — SODIUM CHLORIDE 0.9 % IV SOLN
1.0000 g | Freq: Every day | INTRAVENOUS | Status: DC
  Administered 2020-05-17 – 2020-05-19 (×3): 1 g via INTRAVENOUS
  Filled 2020-05-17 (×2): qty 1
  Filled 2020-05-17: qty 10

## 2020-05-17 MED ORDER — HYDROMORPHONE HCL 1 MG/ML IJ SOLN
1.0000 mg | Freq: Once | INTRAMUSCULAR | Status: AC
  Administered 2020-05-17: 1 mg via INTRAVENOUS
  Filled 2020-05-17: qty 1

## 2020-05-17 MED ORDER — HYDROMORPHONE HCL 1 MG/ML IJ SOLN
0.5000 mg | INTRAMUSCULAR | Status: DC | PRN
  Administered 2020-05-17 – 2020-05-18 (×5): 0.5 mg via INTRAVENOUS
  Filled 2020-05-17 (×5): qty 0.5

## 2020-05-17 MED ORDER — PANTOPRAZOLE SODIUM 40 MG IV SOLR
40.0000 mg | Freq: Two times a day (BID) | INTRAVENOUS | Status: DC
  Administered 2020-05-17 – 2020-05-20 (×6): 40 mg via INTRAVENOUS
  Filled 2020-05-17 (×6): qty 40

## 2020-05-17 MED ORDER — TECHNETIUM TC 99M MEBROFENIN IV KIT
4.6500 | PACK | Freq: Once | INTRAVENOUS | Status: AC
  Administered 2020-05-17: 4.65 via INTRAVENOUS

## 2020-05-17 MED ORDER — CARVEDILOL 3.125 MG PO TABS
3.1250 mg | ORAL_TABLET | Freq: Two times a day (BID) | ORAL | Status: DC
  Administered 2020-05-17 – 2020-05-20 (×4): 3.125 mg via ORAL
  Filled 2020-05-17 (×4): qty 1

## 2020-05-17 NOTE — Consult Note (Addendum)
Referring Provider:  EDP Primary Care Physician:  [name], MD Primary Gastroenterologist:  Dr. Silverio Decamp  Reason for Consultation:  RUQ abdominal pain, nausea, vomiting  HPI: Morgan [name] is a 69 y.o. female with past medical history listed below who presented to [facility] ED yesterday with complaints of worsening right upper quadrant abdominal pain and nausea/vomiting.  She was just seen by Dr. Silverio Decamp in the office on May 19 and pain was thought to possibly be gallbladder related.  Apparently she had been feeling okay and then on Saturday again had sudden onset of right upper quadrant abdominal pain with nausea and vomiting again.  Plan per Dr. Woodward Ku recent office visit was ultrasound to evaluate gallbladder, upper GI series to further evaluate history of pyloric channel stenosis and evaluate for any significant delay in flow of contrast through the stomach, and then (possible) EGD and colonoscopy for evaluation of iron deficiency anemia.  She was given Dexilant 60 mg daily, Carafate 1 g before meals and at bedtime, and Phenergan 25 mg daily for severe nausea.  Was also started on Linzess 145 mcg daily for chronic idiopathic constipation.  Imaging and recent endoscopic studies as follows:  Ultrasound 05/10/2020:  IMPRESSION: Large solitary gallstone is noted without cholecystitis. No other abnormality seen in the right upper quadrant of the abdomen.  Ultrasound 05/16/2020:  IMPRESSION: 1. No cholelithiasis or sonographic evidence of acute cholecystitis.  Previously demonstrated 10 mm gallstone is not visualized on the current examination and may reflect recent passage of the stone. 2. Increased hepatic echogenicity as can be seen with hepatic steatosis.  CT scan abdomen and pelvis with contrast 05/16/2020:  IMPRESSION: 1. No acute intra-abdominal or pelvic pathology. 2. Cholelithiasis. 3. Colonic diverticulosis. 4. Aortic Atherosclerosis (ICD10-I70.0).   CT scan  abdomen and pelvis with contrast 03/28/2020:  IMPRESSION: 1. No acute CT findings of the abdomen or pelvis to explain nausea or vomiting.  2.  No large burden of stool in the colon.  3. Calcified gallstone near the gallbladder neck without findings to suggest acute cholecystitis.  4.  Diverticulosis without evidence of acute diverticulitis.  5.  Aortic Atherosclerosis (ICD10-I70.0).   EGD Apr 21, 2020: By Dr. Ardis Hughs: Benign appearing pyloric channel stenosis, biopsied and dilated with CRE balloon to 15 mm  EGD January 2009 showed pyloric stenosis. Gastric antrum biopsies were obtained showed benign antral mucosa with no dysplasia, intestinal metaplasia or malignancy.Colonoscopy January 2009 showed sigmoid and descending colon diverticulosis and small internal hemorrhoids.  She tells me that she has been taking all of the new medications as prescribed.   Past Medical History:  Diagnosis Date  . Allergy   . Anemia   . Anxiety   . Arthritis   . Depression   . GERD (gastroesophageal reflux disease)   . Hypertension   . MVP (mitral valve prolapse)     Past Surgical History:  Procedure Laterality Date  . ABDOMINAL HYSTERECTOMY    . APPENDECTOMY    . BALLOON DILATION N/A 04/21/2020   Procedure: BALLOON DILATION;  Surgeon: Milus Banister, MD;  Location: [facility];  Service: Endoscopy;  Laterality: N/A;  pyloric/ GI  . BIOPSY  04/21/2020   Procedure: BIOPSY;  Surgeon: Milus Banister, MD;  Location: [facility];  Service: Endoscopy;;  . BREAST SURGERY Right 1988   tumor removal  . ESOPHAGOGASTRODUODENOSCOPY (EGD) WITH PROPOFOL N/A 04/21/2020   Procedure: ESOPHAGOGASTRODUODENOSCOPY (EGD) WITH PROPOFOL;  Surgeon: Milus Banister, MD;  Location: [facility];  Service:  Endoscopy;  Laterality: N/A;  . KNEE SURGERY  2005   2 times left  . TUBAL LIGATION      Prior to Admission medications   Medication Sig Start Date End Date Taking? Authorizing Provider  albuterol  (VENTOLIN HFA) 108 (90 Base) MCG/ACT inhaler TAKE 2 PUFFS BY MOUTH EVERY 6 HOURS AS NEEDED FOR WHEEZE OR SHORTNESS OF BREATH Patient taking differently: Inhale 2 puffs into the lungs every 6 (six) hours as needed for wheezing or shortness of breath.  02/22/20  Yes [name], MD  aspirin 81 MG EC tablet Take 81 mg by mouth daily. daily   Yes [provider]  aspirin-acetaminophen-caffeine (EXCEDRIN MIGRAINE) 225 544 3302 MG tablet Take 2 tablets by mouth every 6 (six) hours as needed for headache.   Yes [provider]  atorvastatin (LIPITOR) 40 MG tablet Take 40 mg by mouth daily.    Yes [provider]  carvedilol (COREG) 3.125 MG tablet Take 3.125 mg by mouth in the morning and at bedtime.   Yes [provider]  colestipol (COLESTID) 1 g tablet TAKE 1 TABLET (1 G TOTAL) BY MOUTH 2 (TWO) TIMES DAILY. 07/20/19  Yes Kairon Shock, Venia Minks, MD  dexlansoprazole (DEXILANT) 60 MG capsule Take 1 capsule (60 mg total) by mouth daily. 05/04/20  Yes Maryclare Nydam, Venia Minks, MD  esomeprazole (NEXIUM) 40 MG capsule Take 1 capsule by mouth twice daily. Patient taking differently: Take 40 mg by mouth 2 (two) times daily.  03/30/20  Yes Esterwood, Amy S, PA-C  FLUoxetine (PROZAC) 40 MG capsule Take 40 mg by mouth daily.   Yes [provider]  furosemide (LASIX) 40 MG tablet HOLD UNTIL seen by your doctor Patient taking differently: Take 40 mg by mouth daily.  02/19/19  Yes Oretha Milch D, MD  gabapentin (NEURONTIN) 400 MG capsule Take 400 mg by mouth 3 (three) times daily.  10/20/18  Yes [provider]  hydrOXYzine (ATARAX/VISTARIL) 25 MG tablet Take 12.5-25 mg by mouth 3 (three) times daily as needed for anxiety.   Yes [provider]  linaclotide Rolan Lipa) 145 MCG CAPS capsule Take 1 capsule (145 mcg total) by mouth daily before breakfast. 05/04/20  Yes Egan Sahlin, Venia Minks, MD  mirtazapine (REMERON) 45 MG tablet Take 45 mg by mouth at bedtime.    Yes [provider]  promethazine (PHENERGAN) 25 MG tablet Take 1 tablet (25 mg total) by mouth daily as needed for nausea or vomiting. 05/04/20  Yes Alayja Armas, Venia Minks, MD  Ferrous Sulfate (IRON) 325 (65 Fe) MG TABS Take 1 tablet (325 mg total) by mouth daily. Patient not taking: Reported on 05/16/2020 04/09/19   [name], MD  Na Sulfate-K Sulfate-Mg Sulf (SUPREP BOWEL PREP KIT) 17.5-3.13-1.6 GM/177ML SOLN Take 1 kit by mouth as directed. Patient not taking: Reported on 05/16/2020 05/04/20   Mauri Pole, MD  OXYGEN Inhale into the lungs. 02 at bedtime    [provider]  sucralfate (CARAFATE) 1 g tablet Take 1 tablet (1 g total) by mouth 4 (four) times daily -  with meals and at bedtime. 05/04/20   Mauri Pole, MD    Current Facility-Administered Medications  Medication Dose Route Frequency Provider Last Rate Last Admin  . albuterol (PROVENTIL) (2.5 MG/3ML) 0.083% nebulizer solution 2.5 mg  2.5 mg Nebulization Q6H PRN Rise Patience, MD      . cefTRIAXone (ROCEPHIN) 1 g in sodium chloride 0.9 % 100 mL IVPB  1 g Intravenous Q0600 Hal Hope,  Doreatha Lew, MD 200 mL/hr at 05/17/20 0640 1 g at 05/17/20 0640  . dextrose 5 %-0.9 % sodium chloride infusion   Intravenous Continuous Rise Patience, MD 75 mL/hr at 05/17/20 0400 Rate Verify at 05/17/20 0400  . HYDROmorphone (DILAUDID) injection 0.5 mg  0.5 mg Intravenous Q3H PRN Rise Patience, MD   0.5 mg at 05/17/20 0411  . labetalol (NORMODYNE) injection 10 mg  10 mg Intravenous Q2H PRN Rise Patience, MD      . ondansetron [facility]) tablet 4 mg  4 mg Oral Q6H PRN Rise Patience, MD       Or  . ondansetron [facility]) injection 4 mg  4 mg Intravenous Q6H PRN Rise Patience, MD   4 mg at 05/17/20 0409  . pantoprazole (PROTONIX) injection 40 mg  40 mg Intravenous QHS Rise Patience, MD   40 mg at 05/17/20 0023    Allergies as of 05/16/2020 - Review Complete 05/16/2020  Allergen Reaction Noted    . Demerol [meperidine] Hives 04/28/2013  . Strawberry (diagnostic) Hives 08/26/2017  . Wasp venom Hives 04/11/2020    Family History  Problem Relation Age of Onset  . Breast cancer Mother 67  . Ovarian cancer Mother 89  . Brain cancer Mother   . Hypertension Father   . Hyperlipidemia Father   . Heart disease Father   . Breast cancer Maternal Grandmother 93  . Diabetes Paternal Grandmother   . Breast cancer Paternal Grandmother 74  . Cancer Paternal Grandfather   . Breast cancer Maternal Aunt 60  . Breast cancer Maternal Aunt 60    Social History   Socioeconomic History  . Marital status: Single    Spouse name: Not on file  . Number of children: 2  . Years of education: Not on file  . Highest education level: Not on file  Occupational History  . Not on file  Tobacco Use  . Smoking status: Former Smoker    Packs/day: 1.00    Years: 15.00    Pack years: 15.00    Types: Cigarettes    Quit date: 09/12/2017    Years since quitting: 2.6  . Smokeless tobacco: Never Used  Substance and Sexual Activity  . Alcohol use: Not Currently  . Drug use: No  . Sexual activity: Not Currently  Other Topics Concern  . 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 Difficulty of Paying Living Expenses:   Food Insecurity:   . Worried About Charity fundraiser in the Last Year:   . Arboriculturist in the Last Year:   Transportation Needs:   . Film/video editor (Medical):   Marland Kitchen Lack of Transportation (Non-Medical):    Physical Activity:   . Days of Exercise per Week:   . Minutes of Exercise per Session:   Stress:   . Feeling of Stress :   Social Connections:   . Frequency of Communication with Friends  and Family:   . Frequency of Social Gatherings with Friends and Family:   . Attends Religious Services:   . Active Member of Clubs or Organizations:   . Attends Archivist Meetings:   Marland Kitchen Marital Status:   Intimate Partner Violence:   . Fear of Current or Ex-Partner:   . Emotionally Abused:   Marland Kitchen Physically Abused:   . Sexually Abused:     Review of Systems: ROS is O/W negative except as mentioned in HPI.  Physical Exam: Vital signs in last 24 hours: Temp:  [97.6 F (36.4 C)-98.3 F (36.8 C)] 97.6 F (36.4 C) (06/01 0518) Pulse Rate:  [58-77] 74 (06/01 0518) Resp:  [16-20] 17 (06/01 0518) BP: (125-173)/(78-93) 125/78 (06/01 0518) SpO2:  [95 %-99 %] 96 % (06/01 0518) Last BM Date: 05/15/20 General:  Alert, Well-developed, well-nourished, pleasant and cooperative in NAD Head:  Normocephalic and atraumatic. Eyes:  Sclera clear, no icterus.  Conjunctiva pink. Ears:  Normal auditory acuity. Mouth:  No deformity or lesions.   Lungs:  Clear throughout to auscultation.  No wheezes, crackles, or rhonchi.  Heart:  Regular rate and rhythm; no murmurs, clicks, rubs, or gallops. Abdomen:  Soft, non-distended.  BS present.  Diffuse TTP, but mostly localized to RUQ. Msk:  Symmetrical without gross deformities. Pulses:  Normal pulses noted. Extremities:  Without clubbing or edema. Neurologic:  Alert and oriented x 4;  grossly normal neurologically. Skin:  Intact without significant lesions or rashes. Psych:  Alert and cooperative. Normal mood and affect.  Intake/Output from previous day: 05/31 0701 - 06/01 0700 In: 1362.6 [I.V.:257.6; IV Piggyback:1105] Out: -   Lab Results: Recent Labs    05/16/20 1442 05/17/20 0513  WBC 10.2 9.2  HGB 10.9* 10.2*  HCT 35.7* 34.8*  PLT 296 270    BMET Recent Labs    05/16/20 1442 05/17/20 0513  NA 141 143  K 4.1 3.8  CL 103 105  CO2 29 31  GLUCOSE 114* 123*  BUN 22 20  CREATININE 0.95 0.96  CALCIUM 8.6* 8.3*   LFT Recent Labs    05/17/20 0513  PROT 6.7  ALBUMIN 3.5  AST 14*  ALT 13  ALKPHOS 103  BILITOT 0.5  BILIDIR <0.1  IBILI NOT CALCULATED    Studies/Results: CT ABDOMEN PELVIS W CONTRAST  Result Date: 05/16/2020 CLINICAL DATA:  69 year old female with right upper quadrant abdominal pain. EXAM: CT ABDOMEN AND PELVIS WITH CONTRAST TECHNIQUE: Multidetector CT imaging of the abdomen and pelvis was performed using the standard protocol following bolus administration of intravenous contrast. CONTRAST:  153m OMNIPAQUE IOHEXOL 300 MG/ML  SOLN COMPARISON:  CT abdomen pelvis dated 03/28/2020. FINDINGS: Lower chest: The visualized lung bases are clear. No intra-abdominal free air or free fluid. Hepatobiliary: The liver is unremarkable. No intrahepatic biliary ductal dilatation. There is a gallstone. No pericholecystic fluid or evidence of acute cholecystitis. Pancreas: Unremarkable. No pancreatic ductal dilatation or surrounding inflammatory changes. Spleen: Normal in size without focal abnormality. Adrenals/Urinary Tract: The adrenal glands are unremarkable. There is no hydronephrosis on either side. There is symmetric enhancement and excretion of contrast by both kidneys. The visualized ureters and urinary bladder appear unremarkable. Stomach/Bowel: There is sigmoid diverticulosis and several scattered colonic diverticula without active inflammatory changes. There is no bowel obstruction or active inflammation. Appendectomy. Vascular/Lymphatic: Mild aortoiliac atherosclerotic disease. The IVC is unremarkable. No portal venous gas. There is no adenopathy. Reproductive: Hysterectomy. Other: None Musculoskeletal: No acute osseous pathology. IMPRESSION: 1. No acute intra-abdominal or pelvic  pathology. 2. Cholelithiasis. 3. Colonic  diverticulosis. 4. Aortic Atherosclerosis (ICD10-I70.0). Electronically Signed   By: Anner Crete M.D.   On: 05/16/2020 18:46   US Abdomen Limited RUQ  Result Date: 05/16/2020 CLINICAL DATA:  Right upper quadrant pain EXAM: ULTRASOUND ABDOMEN LIMITED RIGHT UPPER QUADRANT COMPARISON:  05/06/2020 FINDINGS: Gallbladder: No gallstones. Top normal gallbladder wall thickness. No sonographic Murphy sign noted by sonographer. Common bile duct: Diameter: 3.9 mm Liver: No focal lesion identified. Increased hepatic parenchymal echogenicity. Portal vein is patent on color Doppler imaging with normal direction of blood flow towards the liver. Other: None. IMPRESSION: 1. No cholelithiasis or sonographic evidence of acute cholecystitis. Previously demonstrated 10 mm gallstone is not visualized on the current examination and may reflect recent passage of the stone. 2. Increased hepatic echogenicity as can be seen with hepatic steatosis. Electronically Signed   By: Kathreen Devoid   On: 05/16/2020 16:37   IMPRESSION/PLAN:  69 year old female with complaints of right-sided abdominal pain with associated nausea and vomiting.  Previously had a 10 mm solitary gallstone noted, but is not seen on ultrasound yesterday.  HIDA scan has been ordered by the hospitalist.  GERD and dysphagia:  Upper GI series tomorrow AM to further evaluate pyloric channel stenosis and any significant delay in flow of contrast through the stomach.  Chronic idiopathic constipation: Doing well overall on Linzess 145 mcg daily as an outpatient.  Will resume this when she is taking p.o. meds.  Iron deficiency anemia:  Hgb is stable.  Colonoscopy and possible repeat EGD likely as scheduled as an outpatient but will await results of the above studies and see how her hospital course goes.  -Pending results of the HIDA scan and the upper GI series may need surgical consult for possible cholecystectomy. -We will increase pantoprazole to 40 mg IV twice  daily. -Antiemetics prn.  IVF's and supportive care.   Laban Emperor. Zehr  05/17/2020, 9:14 AM    Attending physician's note   I have taken a history, examined the patient and reviewed the chart. I agree with the Advanced Practitioner's note, impression and recommendations.  63 yr F admitted with worsening RUQ abd pain, nausea and vomiting.  RUQ ultrasound with large solitary gallstone. HIDA scan is pending, depending on the HIDA scan will request surgery consult for possible cholecystectomy.   Upper GI series to evaluate the pyloric stenosis s/p dilation and also c/o intermittent dysphagia. ?hiatal hernia, will request it for tomorrow  Plan for colonoscopy for iron deficiency anemia as outpatient along with +/- EGD  Continue supportive care  GI will continue to follow.   Damaris Hippo , MD [phone]

## 2020-05-17 NOTE — Progress Notes (Signed)
Pt arrived on 5E unit at [place] abd pain getting more intense than in the ED. Contacted [facility] on call - Ardith Dark and got a 1xDilaudid order. Pt is reporting the pain (8/10) with no nausea. No PRN pain meds available. [facility] paged again

## 2020-05-17 NOTE — Telephone Encounter (Signed)
Patient is admitted to the hospital, will see her as inpatient and proceed with GI work-up as needed.  Thank you

## 2020-05-17 NOTE — Progress Notes (Signed)
PROGRESS NOTE  Morgan [name] [id number] DOB: 08/14/1951 DOA: 05/16/2020 PCP: [name], MD   LOS: 0 days   Brief narrative: As per HPI,   Morgan [name] is a 69 y.o. female with history of chronic abdominal pain, essential hypertension follows up with [facility] had recent EGD done on Apr 21, 2020 which showed benign-appearing pyloric stenosis for which patient underwent dilatation and had subsequently followed up with gastroenterologist presents to the ER because of worsening abdominal pain for few days withvomiting.ED Course: In the ER patient had CT abdomen and pelvis and sonogram of the abdomen which both showed cholelithiasis with no definite evidence of cholecystitis.  On exam, patient had right upper quadrant tenderness.  LFTs were normal with normal WBC count patient afebrile.  UA shows features concerning for UTI.  ER physician discussed with on-call gastroenterologist for [place] Dr. Lyndel Safe who advised HIDA scan.  Patient was then admitted to the hospital for further evaluation and treatment.    Assessment/Plan:  Principal Problem:   Abdominal pain Active Problems:   Essential hypertension   CKD (chronic kidney disease) stage 3, GFR 30-59 ml/min  Right upper quadrant pain CT scan of the abdomen shows cholelithiasis.  No cholecystitis.  HIDA scan has been ordered.  GI on board.  Follow recommendations.  Scan of the abdomen and pelvis showed no acute findings except calcified gallstone near the gallbladder.  Continue Protonix IV twice daily.  Continue antiemetics IV fluids.  History of GERD.  Dysphagia.  Upper GI series to assess for pyloric stenosis.  Idiopathic constipation.  On Linzess.  Continue.  Iron deficiency anemia.  GI on board.  Essential hypertension we will keep patient as needed IV hydralazine. Oral medications on hold at this time.  VTE Prophylaxis: SCD  Code Status: Full code  Family Communication: None  Status is: Observation  The patient will  require care spanning > 2 midnights and should be moved to inpatient because: Ongoing diagnostic testing needed not appropriate for outpatient work up and Further GI work-up/possible surgical intervention  Dispo: The patient is from: Home              Anticipated d/c is to: Home              Anticipated d/c date is: 2 days              Patient currently is not medically stable to d/c.  Consultants:  GI  Procedures:  HIDA scan  Antibiotics:  . Rocephin IV  Anti-infectives (From admission, onward)   Start     Dose/Rate Route Frequency Ordered Stop   05/17/20 0530  cefTRIAXone (ROCEPHIN) 1 g in sodium chloride 0.9 % 100 mL IVPB     1 g 200 mL/hr over 30 Minutes Intravenous Daily 05/17/20 0516       Subjective: Today, patient was seen and examined at bedside.  Patient still complains of right upper quadrant pain.  Nauseated and had one episode of vomiting yesterday.  This seems to be ongoing in nature.  Nausea+.  Had bowel movements 2 days back.  Objective: Vitals:   05/17/20 0956 05/17/20 1108  BP: (!) 171/93 (!) 171/93  Pulse: 72 72  Resp: 16 16  Temp: 98.1 F (36.7 C) 98.1 F (36.7 C)  SpO2: 100%     Intake/Output Summary (Last 24 hours) at 05/17/2020 1313 Last data filed at 05/17/2020 0400 Gross per 24 hour  Intake 1362.6 ml  Output --  Net 1362.6 ml  Filed Weights   05/17/20 1108  Weight: 109.8 kg   Body mass index is 31.93 kg/m.   Physical Exam:  GENERAL: Patient is alert awake and oriented. Not in obvious distress.  Obese HENT: No scleral pallor or icterus. Pupils equally reactive to light. Oral mucosa is moist NECK: is supple, no gross swelling noted. CHEST: Coarse breath sounds bilaterally  CVS: S1 and S2 heard, no murmur. Regular rate and rhythm.  ABDOMEN: Soft, diffusely tender bowel sounds are present. EXTREMITIES: No edema. CNS: Cranial nerves are intact. No focal motor deficits. SKIN: warm and dry without rashes.  Data Review: I have  personally reviewed the following laboratory data and studies,  CBC: Recent Labs  Lab 05/16/20 1442 05/17/20 0513  WBC 10.2 9.2  HGB 10.9* 10.2*  HCT 35.7* 34.8*  MCV 82.4 85.1  PLT 296 [id number]   Basic Metabolic Panel: Recent Labs  Lab 05/16/20 1442 05/17/20 0513  NA 141 143  K 4.1 3.8  CL 103 105  CO2 29 31  GLUCOSE 114* 123*  BUN 22 20  CREATININE 0.95 0.96  CALCIUM 8.6* 8.3*   Liver Function Tests: Recent Labs  Lab 05/16/20 1442 05/17/20 0513  AST 16 14*  ALT 13 13  ALKPHOS 109 103  BILITOT 0.5 0.5  PROT 6.8 6.7  ALBUMIN 3.4* 3.5   Recent Labs  Lab 05/16/20 1442  LIPASE 23   No results for input(s): AMMONIA in the last 168 hours. Cardiac Enzymes: No results for input(s): CKTOTAL, CKMB, CKMBINDEX, TROPONINI in the last 168 hours. BNP (last 3 results) No results for input(s): BNP in the last 8760 hours.  ProBNP (last 3 results) No results for input(s): PROBNP in the last 8760 hours.  CBG: Recent Labs  Lab 05/17/20 0049 05/17/20 0741  GLUCAP 107* 101*   Recent Results (from the past 240 hour(s))  SARS Coronavirus 2 by RT PCR (hospital order, performed in [facility] hospital lab) Nasopharyngeal Nasopharyngeal Swab     Status: None   Collection Time: 05/17/20  7:49 AM   Specimen: Nasopharyngeal Swab  Result Value Ref Range Status   SARS Coronavirus 2 NEGATIVE NEGATIVE Final    Comment: (NOTE) SARS-CoV-2 target nucleic acids are NOT DETECTED. The SARS-CoV-2 RNA is generally detectable in upper and lower respiratory specimens during the acute phase of infection. The lowest concentration of SARS-CoV-2 viral copies this assay can detect is 250 copies / mL. A negative result does not preclude SARS-CoV-2 infection and should not be used as the sole basis for treatment or other patient management decisions.  A negative result may occur with improper specimen collection / handling, submission of specimen other than nasopharyngeal swab, presence of viral  mutation(s) within the areas targeted by this assay, and inadequate number of viral copies (<250 copies / mL). A negative result must be combined with clinical observations, patient history, and epidemiological information. Fact Sheet for Patients:   StrictlyIdeas.no Fact Sheet for Healthcare Providers: BankingDealers.co.za This test is not yet approved or cleared  by the [place] FDA and has been authorized for detection and/or diagnosis of SARS-CoV-2 by FDA under an Emergency Use Authorization (EUA).  This EUA will remain in effect (meaning this test can be used) for the duration of the COVID-19 declaration under Section 564(b)(1) of the Act, 21 U.S.C. section 360bbb-3(b)(1), unless the authorization is terminated or revoked sooner. Performed at [facility], [place] [address], [place], [place] [address]      Studies: NM Hepatobiliary Liver Func  Result Date: 05/17/2020 CLINICAL DATA:  Right upper quadrant pain, cholelithiasis EXAM: NUCLEAR MEDICINE HEPATOBILIARY IMAGING TECHNIQUE: Sequential images of the abdomen were obtained out to 60 minutes following intravenous administration of radiopharmaceutical. RADIOPHARMACEUTICALS:  4.65 mCi Tc-21m  Choletec IV COMPARISON:  CT abdomen pelvis, 05/16/2020 FINDINGS: Prompt uptake and biliary excretion of activity by the liver is seen. Gallbladder activity is visualized at 10 minutes, consistent with patency of cystic duct. Biliary activity passes into small bowel at 35 minutes, consistent with patent common bile duct. IMPRESSION: The cystic and common bile ducts are patent. Electronically Signed   By: Eddie Candle M.D.   On: 05/17/2020 12:38   CT ABDOMEN PELVIS W CONTRAST  Result Date: 05/16/2020 CLINICAL DATA:  69 year old female with right upper quadrant abdominal pain. EXAM: CT ABDOMEN AND PELVIS WITH CONTRAST TECHNIQUE: Multidetector CT imaging of the abdomen and pelvis was  performed using the standard protocol following bolus administration of intravenous contrast. CONTRAST:  118mL OMNIPAQUE IOHEXOL 300 MG/ML  SOLN COMPARISON:  CT abdomen pelvis dated 03/28/2020. FINDINGS: Lower chest: The visualized lung bases are clear. No intra-abdominal free air or free fluid. Hepatobiliary: The liver is unremarkable. No intrahepatic biliary ductal dilatation. There is a gallstone. No pericholecystic fluid or evidence of acute cholecystitis. Pancreas: Unremarkable. No pancreatic ductal dilatation or surrounding inflammatory changes. Spleen: Normal in size without focal abnormality. Adrenals/Urinary Tract: The adrenal glands are unremarkable. There is no hydronephrosis on either side. There is symmetric enhancement and excretion of contrast by both kidneys. The visualized ureters and urinary bladder appear unremarkable. Stomach/Bowel: There is sigmoid diverticulosis and several scattered colonic diverticula without active inflammatory changes. There is no bowel obstruction or active inflammation. Appendectomy. Vascular/Lymphatic: Mild aortoiliac atherosclerotic disease. The IVC is unremarkable. No portal venous gas. There is no adenopathy. Reproductive: Hysterectomy. Other: None Musculoskeletal: No acute osseous pathology. IMPRESSION: 1. No acute intra-abdominal or pelvic pathology. 2. Cholelithiasis. 3. Colonic diverticulosis. 4. Aortic Atherosclerosis (ICD10-I70.0). Electronically Signed   By: Anner Crete M.D.   On: 05/16/2020 18:46   US Abdomen Limited RUQ  Result Date: 05/16/2020 CLINICAL DATA:  Right upper quadrant pain EXAM: ULTRASOUND ABDOMEN LIMITED RIGHT UPPER QUADRANT COMPARISON:  05/06/2020 FINDINGS: Gallbladder: No gallstones. Top normal gallbladder wall thickness. No sonographic Murphy sign noted by sonographer. Common bile duct: Diameter: 3.9 mm Liver: No focal lesion identified. Increased hepatic parenchymal echogenicity. Portal vein is patent on color Doppler imaging with  normal direction of blood flow towards the liver. Other: None. IMPRESSION: 1. No cholelithiasis or sonographic evidence of acute cholecystitis. Previously demonstrated 10 mm gallstone is not visualized on the current examination and may reflect recent passage of the stone. 2. Increased hepatic echogenicity as can be seen with hepatic steatosis. Electronically Signed   By: Kathreen Devoid   On: 05/16/2020 16:37      Flora Lipps, MD  [facility] 05/17/2020

## 2020-05-18 ENCOUNTER — Inpatient Hospital Stay (HOSPITAL_COMMUNITY): Payer: Medicare HMO | Admitting: Certified Registered Nurse Anesthetist

## 2020-05-18 ENCOUNTER — Inpatient Hospital Stay (HOSPITAL_COMMUNITY): Payer: Medicare HMO

## 2020-05-18 ENCOUNTER — Encounter (HOSPITAL_COMMUNITY)
Admission: EM | Disposition: A | Discharge status: Home / Self Care | Payer: Self-pay | Source: Home / Self Care | Attending: Internal Medicine

## 2020-05-18 ENCOUNTER — Encounter (HOSPITAL_COMMUNITY): Payer: Self-pay | Admitting: Internal Medicine

## 2020-05-18 HISTORY — PX: CHOLECYSTECTOMY: SHX55

## 2020-05-18 LAB — MAGNESIUM: Magnesium: 2.1 mg/dL (ref 1.7–2.4)

## 2020-05-18 LAB — GLUCOSE, CAPILLARY
Glucose-Capillary: 103 mg/dL — ABNORMAL HIGH (ref 70–99)
Glucose-Capillary: 113 mg/dL — ABNORMAL HIGH (ref 70–99)
Glucose-Capillary: 119 mg/dL — ABNORMAL HIGH (ref 70–99)
Glucose-Capillary: 121 mg/dL — ABNORMAL HIGH (ref 70–99)

## 2020-05-18 LAB — PHOSPHORUS: Phosphorus: 3.3 mg/dL (ref 2.5–4.6)

## 2020-05-18 LAB — CBC
HCT: 32 % — ABNORMAL LOW (ref 36.0–46.0)
Hemoglobin: 9.4 g/dL — ABNORMAL LOW (ref 12.0–15.0)
MCH: 25 pg — ABNORMAL LOW (ref 26.0–34.0)
MCHC: 29.4 g/dL — ABNORMAL LOW (ref 30.0–36.0)
MCV: 85.1 fL (ref 80.0–100.0)
Platelets: 238 10*3/uL (ref 150–400)
RBC: 3.76 MIL/uL — ABNORMAL LOW (ref 3.87–5.11)
RDW: 15.9 % — ABNORMAL HIGH (ref 11.5–15.5)
WBC: 7.5 10*3/uL (ref 4.0–10.5)
nRBC: 0 % (ref 0.0–0.2)

## 2020-05-18 LAB — COMPREHENSIVE METABOLIC PANEL
ALT: 11 U/L (ref 0–44)
AST: 10 U/L — ABNORMAL LOW (ref 15–41)
Albumin: 3 g/dL — ABNORMAL LOW (ref 3.5–5.0)
Alkaline Phosphatase: 92 U/L (ref 38–126)
Anion gap: 8 (ref 5–15)
BUN: 17 mg/dL (ref 8–23)
CO2: 29 mmol/L (ref 22–32)
Calcium: 8.2 mg/dL — ABNORMAL LOW (ref 8.9–10.3)
Chloride: 103 mmol/L (ref 98–111)
Creatinine, Ser: 0.76 mg/dL (ref 0.44–1.00)
GFR calc Af Amer: >60 mL/min (ref >60)
GFR calc non Af Amer: >60 mL/min (ref >60)
Glucose, Bld: 112 mg/dL — ABNORMAL HIGH (ref 70–99)
Potassium: 3.5 mmol/L (ref 3.5–5.1)
Sodium: 140 mmol/L (ref 135–145)
Total Bilirubin: 0.4 mg/dL (ref 0.3–1.2)
Total Protein: 5.9 g/dL — ABNORMAL LOW (ref 6.5–8.1)

## 2020-05-18 SURGERY — LAPAROSCOPIC CHOLECYSTECTOMY WITH INTRAOPERATIVE CHOLANGIOGRAM
Anesthesia: General | Site: Abdomen

## 2020-05-18 MED ORDER — HYDROCODONE-ACETAMINOPHEN 5-325 MG PO TABS
1.0000 | ORAL_TABLET | ORAL | Status: DC | PRN
  Administered 2020-05-20: 1 via ORAL
  Filled 2020-05-18: qty 1

## 2020-05-18 MED ORDER — HYDROMORPHONE HCL 1 MG/ML IJ SOLN: 0.5000 mg | INTRAMUSCULAR | Status: DC | PRN

## 2020-05-18 MED ORDER — FENTANYL CITRATE (PF) 100 MCG/2ML IJ SOLN
INTRAMUSCULAR | Status: AC
  Administered 2020-05-18: 50 ug via INTRAVENOUS
  Filled 2020-05-18: qty 2

## 2020-05-18 MED ORDER — LACTATED RINGERS IV SOLN: INTRAVENOUS | Status: DC

## 2020-05-18 MED ORDER — ROCURONIUM BROMIDE 10 MG/ML (PF) SYRINGE
PREFILLED_SYRINGE | INTRAVENOUS | Status: AC
  Filled 2020-05-18: qty 10

## 2020-05-18 MED ORDER — METOPROLOL TARTRATE 5 MG/5ML IV SOLN
5.0000 mg | Freq: Four times a day (QID) | INTRAVENOUS | Status: DC | PRN
  Filled 2020-05-18: qty 5

## 2020-05-18 MED ORDER — FENTANYL CITRATE (PF) 100 MCG/2ML IJ SOLN
INTRAMUSCULAR | Status: DC | PRN
  Administered 2020-05-18 (×5): 50 ug via INTRAVENOUS
  Administered 2020-05-18: 100 ug via INTRAVENOUS

## 2020-05-18 MED ORDER — PROPOFOL 10 MG/ML IV BOLUS
INTRAVENOUS | Status: DC | PRN
  Administered 2020-05-18: 140 mg via INTRAVENOUS

## 2020-05-18 MED ORDER — PROMETHAZINE HCL 25 MG/ML IJ SOLN
12.5000 mg | Freq: Four times a day (QID) | INTRAMUSCULAR | Status: DC | PRN
  Administered 2020-05-18: 12.5 mg via INTRAVENOUS
  Filled 2020-05-18: qty 1

## 2020-05-18 MED ORDER — HYDROMORPHONE HCL 1 MG/ML IJ SOLN
0.5000 mg | INTRAMUSCULAR | Status: DC | PRN
  Administered 2020-05-18 – 2020-05-19 (×4): 0.5 mg via INTRAVENOUS
  Filled 2020-05-18 (×4): qty 0.5

## 2020-05-18 MED ORDER — OXYCODONE HCL 5 MG PO TABS: 5.0000 mg | ORAL_TABLET | Freq: Once | ORAL | Status: DC | PRN

## 2020-05-18 MED ORDER — FENTANYL CITRATE (PF) 100 MCG/2ML IJ SOLN
50.0000 ug | INTRAMUSCULAR | Status: DC
  Administered 2020-05-18: 50 ug via INTRAVENOUS

## 2020-05-18 MED ORDER — ONDANSETRON HCL 4 MG/2ML IJ SOLN
INTRAMUSCULAR | Status: DC | PRN
  Administered 2020-05-18: 40 mg via INTRAVENOUS

## 2020-05-18 MED ORDER — DEXAMETHASONE SODIUM PHOSPHATE 4 MG/ML IJ SOLN
INTRAMUSCULAR | Status: DC | PRN
  Administered 2020-05-18: 5 mg via INTRAVENOUS

## 2020-05-18 MED ORDER — BUPIVACAINE LIPOSOME 1.3 % IJ SUSP
20.0000 mL | Freq: Once | INTRAMUSCULAR | Status: AC
  Administered 2020-05-18: 20 mL
  Filled 2020-05-18: qty 20

## 2020-05-18 MED ORDER — 0.9 % SODIUM CHLORIDE (POUR BTL) OPTIME
TOPICAL | Status: DC | PRN
  Administered 2020-05-18: 1000 mL

## 2020-05-18 MED ORDER — ONDANSETRON HCL 4 MG/2ML IJ SOLN
4.0000 mg | Freq: Four times a day (QID) | INTRAMUSCULAR | Status: DC | PRN
  Administered 2020-05-19 – 2020-05-20 (×2): 4 mg via INTRAVENOUS
  Filled 2020-05-18 (×2): qty 2

## 2020-05-18 MED ORDER — DEXAMETHASONE SODIUM PHOSPHATE 10 MG/ML IJ SOLN
INTRAMUSCULAR | Status: AC
  Filled 2020-05-18: qty 1

## 2020-05-18 MED ORDER — ONDANSETRON 4 MG PO TBDP: 4.0000 mg | ORAL_TABLET | Freq: Four times a day (QID) | ORAL | Status: DC | PRN

## 2020-05-18 MED ORDER — PANTOPRAZOLE SODIUM 40 MG IV SOLR: 40.0000 mg | Freq: Every day | INTRAVENOUS | Status: DC

## 2020-05-18 MED ORDER — LACTATED RINGERS IR SOLN
Status: DC | PRN
  Administered 2020-05-18: 1000 mL

## 2020-05-18 MED ORDER — PROPOFOL 10 MG/ML IV BOLUS
INTRAVENOUS | Status: AC
  Filled 2020-05-18: qty 20

## 2020-05-18 MED ORDER — ROCURONIUM BROMIDE 10 MG/ML (PF) SYRINGE
PREFILLED_SYRINGE | INTRAVENOUS | Status: DC | PRN
  Administered 2020-05-18: 60 mg via INTRAVENOUS

## 2020-05-18 MED ORDER — OXYCODONE HCL 5 MG PO TABS
5.0000 mg | ORAL_TABLET | ORAL | Status: DC | PRN
  Administered 2020-05-19 – 2020-05-20 (×2): 10 mg via ORAL
  Filled 2020-05-18 (×2): qty 2

## 2020-05-18 MED ORDER — FENTANYL CITRATE (PF) 100 MCG/2ML IJ SOLN
INTRAMUSCULAR | Status: AC
  Filled 2020-05-18: qty 2

## 2020-05-18 MED ORDER — HEPARIN SODIUM (PORCINE) 5000 UNIT/ML IJ SOLN
5000.0000 [IU] | Freq: Three times a day (TID) | INTRAMUSCULAR | Status: DC
  Administered 2020-05-19 – 2020-05-20 (×5): 5000 [IU] via SUBCUTANEOUS
  Filled 2020-05-18 (×5): qty 1

## 2020-05-18 MED ORDER — HYDROMORPHONE HCL 1 MG/ML IJ SOLN: 0.2500 mg | INTRAMUSCULAR | Status: DC | PRN

## 2020-05-18 MED ORDER — FENTANYL CITRATE (PF) 250 MCG/5ML IJ SOLN
INTRAMUSCULAR | Status: AC
  Filled 2020-05-18: qty 5

## 2020-05-18 MED ORDER — ONDANSETRON HCL 4 MG/2ML IJ SOLN
INTRAMUSCULAR | Status: AC
  Filled 2020-05-18: qty 2

## 2020-05-18 MED ORDER — ACETAMINOPHEN 500 MG PO TABS
1000.0000 mg | ORAL_TABLET | Freq: Three times a day (TID) | ORAL | Status: DC
  Administered 2020-05-18 – 2020-05-20 (×5): 1000 mg via ORAL
  Filled 2020-05-18 (×5): qty 2

## 2020-05-18 MED ORDER — LIDOCAINE 2% (20 MG/ML) 5 ML SYRINGE
INTRAMUSCULAR | Status: DC | PRN
  Administered 2020-05-18: 60 mg via INTRAVENOUS

## 2020-05-18 MED ORDER — OXYCODONE HCL 5 MG/5ML PO SOLN: 5.0000 mg | Freq: Once | ORAL | Status: DC | PRN

## 2020-05-18 MED ORDER — ONDANSETRON HCL 4 MG/2ML IJ SOLN: 4.0000 mg | Freq: Once | INTRAMUSCULAR | Status: DC | PRN

## 2020-05-18 MED ORDER — KETOROLAC TROMETHAMINE 30 MG/ML IJ SOLN
15.0000 mg | Freq: Three times a day (TID) | INTRAMUSCULAR | Status: DC
  Administered 2020-05-18 – 2020-05-20 (×6): 15 mg via INTRAVENOUS
  Filled 2020-05-18 (×6): qty 1

## 2020-05-18 MED ORDER — SUGAMMADEX SODIUM 200 MG/2ML IV SOLN
INTRAVENOUS | Status: DC | PRN
  Administered 2020-05-18: 200 mg via INTRAVENOUS

## 2020-05-18 SURGICAL SUPPLY — 39 items
APL SKNCLS STERI-STRIP NONHPOA (GAUZE/BANDAGES/DRESSINGS)
APPLIER CLIP 5 13 M/L LIGAMAX5 (MISCELLANEOUS)
APPLIER CLIP ROT 10 11.4 M/L (STAPLE)
BENZOIN TINCTURE PRP APPL 2/3 (GAUZE/BANDAGES/DRESSINGS) IMPLANT
CABLE HIGH FREQUENCY MONO STRZ (ELECTRODE) IMPLANT
CATH REDDICK CHOLANGI 4FR 50CM (CATHETERS) ×2 IMPLANT
CLIP APPLIE 5 13 M/L LIGAMAX5 (MISCELLANEOUS) IMPLANT
CLIP APPLIE ROT 10 11.4 M/L (STAPLE) IMPLANT
COVER MAYO STAND STRL (DRAPES) ×2 IMPLANT
COVER SURGICAL LIGHT HANDLE (MISCELLANEOUS) ×2 IMPLANT
COVER WAND RF STERILE (DRAPES) IMPLANT
DECANTER SPIKE VIAL GLASS SM (MISCELLANEOUS) ×2 IMPLANT
DERMABOND ADVANCED (GAUZE/BANDAGES/DRESSINGS) ×1
DERMABOND ADVANCED .7 DNX12 (GAUZE/BANDAGES/DRESSINGS) ×1 IMPLANT
DRAPE C-ARM 42X120 X-RAY (DRAPES) ×2 IMPLANT
ELECT L-HOOK LAP 45CM DISP (ELECTROSURGICAL) ×2
ELECT PENCIL ROCKER SW 15FT (MISCELLANEOUS) ×1 IMPLANT
ELECT REM PT RETURN 15FT ADLT (MISCELLANEOUS) ×2 IMPLANT
ELECTRODE L-HOOK LAP 45CM DISP (ELECTROSURGICAL) IMPLANT
GLOVE BIOGEL M 8.0 STRL (GLOVE) ×2 IMPLANT
GOWN STRL REUS W/TWL XL LVL3 (GOWN DISPOSABLE) ×2 IMPLANT
HEMOSTAT SURGICEL 4X8 (HEMOSTASIS) IMPLANT
IV CATH 14GX2 1/4 (CATHETERS) ×2 IMPLANT
KIT BASIN (CUSTOM PROCEDURE TRAY) ×2 IMPLANT
KIT TURNOVER KIT A (KITS) IMPLANT
POUCH RETRIEVAL ECOSAC 10 (ENDOMECHANICALS) IMPLANT
POUCH RETRIEVAL ECOSAC 10MM (ENDOMECHANICALS) ×2
SCISSORS LAP 5X45 EPIX DISP (ENDOMECHANICALS) ×2 IMPLANT
SET IRRIG TUBING LAPAROSCOPIC (IRRIGATION / IRRIGATOR) ×2 IMPLANT
SET TUBE SMOKE EVAC HIGH FLOW (TUBING) ×2 IMPLANT
SLEEVE XCEL OPT CAN 5 100 (ENDOMECHANICALS) ×3 IMPLANT
STRIP CLOSURE SKIN 1/2X4 (GAUZE/BANDAGES/DRESSINGS) IMPLANT
SUT MNCRL AB 4-0 PS2 18 (SUTURE) ×4 IMPLANT
SYR 20ML LL LF (SYRINGE) ×2 IMPLANT
TOWEL OR 17X26 10 PK STRL BLUE (TOWEL DISPOSABLE) ×2 IMPLANT
TRAY LAPAROSCOPIC (CUSTOM PROCEDURE TRAY) ×2 IMPLANT
TROCAR BLADELESS OPT 5 100 (ENDOMECHANICALS) ×2 IMPLANT
TROCAR XCEL BLUNT TIP 100MML (ENDOMECHANICALS) IMPLANT
TROCAR XCEL NON-BLD 11X100MML (ENDOMECHANICALS) ×1 IMPLANT

## 2020-05-18 NOTE — Op Note (Signed)
Val[facility] Good  Primary Care Physician:  [name], MD    05/18/2020  4:01 PM  Procedure: Laparoscopic Cholecystectomy with intraoperative cholangiogram  Surgeon: Catalina Antigua B. Hassell Done, MD, FACS Asst:  Modena Jansky, PA  Anes:  General  Drains:  None  Findings: Chronic cholecystitis  Description of Procedure: The patient was taken to OR 1 and given general anesthesia.  The patient was prepped with chlorohexidine prep and draped sterilely. A time out was performed including identifying the patient and discussing their procedure.  Access to the abdomen was achieved with a 5 mm Optiview through the right upper quadrant.  Port placement included four 5 mm trocars and one 11-12 in the midline.  There was a redundant segment of liver flopping down and obscuring the infundibular dissection.  An extra trocar was placed to retract this.  The gallbladder was visualized and appeared chronically inflamed.   The fundus of the gallbaldder was grasped and the gallbladder was elevated.  The infundibulum was covered in fat which incised and stripped away to reveal the final of the gallbladder into the cystic duct.  Cystic artery was noted.  I made an incision into the proximal cystic duct and but I could not get the catheter to thread so after multiple attempts I elected to triple clipped the cystic duct and then divide that in and clipped the artery and divide those and remove the gallbladder without entering it.    The cystic duct was then triple clipped and divided, the cystic artery was double clipped and divided and then the gallbladder was removed from the gallbladder bed. Removal of the gallbladder from the gallbladder bed was without entering it.  The gallbladder was then placed in a bag and brought out through one of the trocar sites. The gallbladder bed was inspected and no bleeding or bile leaks were seen.   Laparoscopic visualization was used when closing fascial defects for trocar sites.   Incisions  were injected with Exparel and closed with 4-0 Monocryl and Dermabond on the skin.  Sponge and needle count were correct.    The patient was taken to the recovery room in satisfactory condition.    Kaylyn Lim, MD, FACS

## 2020-05-18 NOTE — Anesthesia Preprocedure Evaluation (Addendum)
Anesthesia Evaluation  Patient identified by MRN, date of birth, ID band Patient awake    Reviewed: Allergy & Precautions, NPO status , Patient's Chart, lab work & pertinent test results, reviewed documented beta blocker date and time   Airway Mallampati: II  TM Distance: >3 FB Neck ROM: Full    Dental  (+) Upper Dentures, Lower Dentures   Pulmonary COPD,  COPD inhaler, former smoker,    Pulmonary exam normal breath sounds clear to auscultation       Cardiovascular hypertension, Pt. on medications and Pt. on home beta blockers +CHF  Normal cardiovascular exam Rhythm:Regular Rate:Normal  EKG 12/22/19 NSR, Septal infarct age indeterminate   Neuro/Psych PSYCHIATRIC DISORDERS Anxiety Depression CVA, No Residual Symptoms    GI/Hepatic Neg liver ROS, PUD, GERD  Medicated and Controlled,Cholelithiasis with acute cholecystitis   Endo/Other  Hyperlipidemia Obesity  Renal/GU Hx/o renal calculi  negative genitourinary   Musculoskeletal  (+) Arthritis , Osteoarthritis,    Abdominal (+) + obese,   Peds  Hematology  (+) anemia ,   Anesthesia Other Findings   Reproductive/Obstetrics                            Anesthesia Physical Anesthesia Plan  ASA: III  Anesthesia Plan: General   Post-op Pain Management:    Induction: Intravenous, Cricoid pressure planned and Rapid sequence  PONV Risk Score and Plan: 4 or greater and Ondansetron, Treatment may vary due to age or medical condition and Dexamethasone  Airway Management Planned: Oral ETT  Additional Equipment:   Intra-op Plan:   Post-operative Plan: Extubation in OR  Informed Consent: I have reviewed the patients History and Physical, chart, labs and discussed the procedure including the risks, benefits and alternatives for the proposed anesthesia with the patient or authorized representative who has indicated his/her understanding and  acceptance.     Dental advisory given  Plan Discussed with: CRNA and Surgeon  Anesthesia Plan Comments:         Anesthesia Quick Evaluation

## 2020-05-18 NOTE — H&P (View-Only) (Signed)
[facility] °Consult Note ° °Evynn F Vacca °06/04/1951  °[id number].   ° °Requesting MD: Nandigam  °Chief Complaint/Reason for Consult: RUQ pain, cholelithiasis °HPI:  °Patient is a 69 year old female who has had intermittent symptoms of abdominal pain with nausea and vomiting for several weeks. She has been evaluated as an outpatient with [facility], had recent EGD done on Apr 21, 2020 which showed benign-appearing pyloric stenosis for which patient underwent dilatation. CT 4/12 showed a large gallstone, RUQ US 5/25 in GI office showed large gallstone again. She presented to the ED 5/31 with worsening abdominal pain, nausea and vomiting. Pain is in RUQ and is severe, does not go anywhere else. Repeat US 5/31 did not see gallstone but it was noted on CT same day. HIDA yesterday was negative for cholecystitis. LFTs within normal limits. Denies fever, chills, chest pain, SOB, urinary symptoms. PMH significant for constipation, GERD, CHF, HTN, CKD stage III, COPD. Echo in 2019 showed EF 55-65%. She reports she takes a blood thinner but I do not see any record of this in her chart and she states it would have been 3 days since taking. Past abdominal surgery includes appendectomy and hysterectomy. Allergic to demerol.  ° °ROS: °Review of Systems  °Constitutional: Negative for chills and fever.  °Respiratory: Negative for shortness of breath.   °Cardiovascular: Negative for chest pain and palpitations.  °Gastrointestinal: Positive for abdominal pain, constipation, nausea and vomiting. Negative for diarrhea.  °Genitourinary: Negative for dysuria and urgency.  °All other systems reviewed and are negative. ° ° °Family History  °Problem Relation Age of Onset  °• Breast cancer Mother 40  °• Ovarian cancer Mother 40  °• Brain cancer Mother   °• Hypertension Father   °• Hyperlipidemia Father   °• Heart disease Father   °• Breast cancer Maternal Grandmother 94  °• Diabetes Paternal Grandmother   °• Breast cancer Paternal  Grandmother 70  °• Cancer Paternal Grandfather   °• Breast cancer Maternal Aunt 60  °• Breast cancer Maternal Aunt 60  ° ° °Past Medical History:  °Diagnosis Date  °• Allergy   °• Anemia   °• Anxiety   °• Arthritis   °• Depression   °• GERD (gastroesophageal reflux disease)   °• Hypertension   °• MVP (mitral valve prolapse)   ° ° °Past Surgical History:  °Procedure Laterality Date  °• ABDOMINAL HYSTERECTOMY    °• APPENDECTOMY    °• BALLOON DILATION N/A 04/21/2020  ° Procedure: BALLOON DILATION;  Surgeon: Jacobs, Daniel P, MD;  Location: [facility];  Service: Endoscopy;  Laterality: N/A;  pyloric/ GI  °• BIOPSY  04/21/2020  ° Procedure: BIOPSY;  Surgeon: Jacobs, Daniel P, MD;  Location: [facility];  Service: Endoscopy;;  °• BREAST SURGERY Right 1988  ° tumor removal  °• ESOPHAGOGASTRODUODENOSCOPY (EGD) WITH PROPOFOL N/A 04/21/2020  ° Procedure: ESOPHAGOGASTRODUODENOSCOPY (EGD) WITH PROPOFOL;  Surgeon: Jacobs, Daniel P, MD;  Location: [facility];  Service: Endoscopy;  Laterality: N/A;  °• KNEE SURGERY  2005  ° 2 times left  °• TUBAL LIGATION    ° ° °Social History:  reports that she quit smoking about 2 years ago. Her smoking use included cigarettes. She has a 15.00 pack-year smoking history. She has never used smokeless tobacco. She reports previous alcohol use. She reports that she does not use drugs. ° °Allergies:  °Allergies  °Allergen Reactions  °• Demerol [Meperidine] Hives  °• Strawberry (Diagnostic) Hives  °• Wasp Venom Hives  ° ° °  Medications Prior to Admission  °Medication Sig Dispense Refill  °• albuterol (VENTOLIN HFA) 108 (90 Base) MCG/ACT inhaler TAKE 2 PUFFS BY MOUTH EVERY 6 HOURS AS NEEDED FOR WHEEZE OR SHORTNESS OF BREATH (Patient taking differently: Inhale 2 puffs into the lungs every 6 (six) hours as needed for wheezing or shortness of breath. ) 18 g 5  °• aspirin 81 MG EC tablet Take 81 mg by mouth daily. daily    °• aspirin-acetaminophen-caffeine (EXCEDRIN MIGRAINE) 250-250-65 MG tablet Take 2  tablets by mouth every 6 (six) hours as needed for headache.    °• atorvastatin (LIPITOR) 40 MG tablet Take 40 mg by mouth daily.     °• carvedilol (COREG) 3.125 MG tablet Take 3.125 mg by mouth in the morning and at bedtime.    °• colestipol (COLESTID) 1 g tablet TAKE 1 TABLET (1 G TOTAL) BY MOUTH 2 (TWO) TIMES DAILY. 180 tablet 1  °• dexlansoprazole (DEXILANT) 60 MG capsule Take 1 capsule (60 mg total) by mouth daily. 30 capsule 3  °• esomeprazole (NEXIUM) 40 MG capsule Take 1 capsule by mouth twice daily. (Patient taking differently: Take 40 mg by mouth 2 (two) times daily. ) 60 capsule 6  °• FLUoxetine (PROZAC) 40 MG capsule Take 40 mg by mouth daily.    °• furosemide (LASIX) 40 MG tablet HOLD UNTIL seen by your doctor (Patient taking differently: Take 40 mg by mouth daily. ) 30 tablet   °• gabapentin (NEURONTIN) 400 MG capsule Take 400 mg by mouth 3 (three) times daily.     °• hydrOXYzine (ATARAX/VISTARIL) 25 MG tablet Take 12.5-25 mg by mouth 3 (three) times daily as needed for anxiety.    °• linaclotide (LINZESS) 145 MCG CAPS capsule Take 1 capsule (145 mcg total) by mouth daily before breakfast. 30 capsule 3  °• mirtazapine (REMERON) 45 MG tablet Take 45 mg by mouth at bedtime.     °• promethazine (PHENERGAN) 25 MG tablet Take 1 tablet (25 mg total) by mouth daily as needed for nausea or vomiting. 30 tablet 1  °• Ferrous Sulfate (IRON) 325 (65 Fe) MG TABS Take 1 tablet (325 mg total) by mouth daily. (Patient not taking: Reported on 05/16/2020) 90 each 0  °• Na Sulfate-K Sulfate-Mg Sulf (SUPREP BOWEL PREP KIT) 17.5-3.13-1.6 GM/177ML SOLN Take 1 kit by mouth as directed. (Patient not taking: Reported on 05/16/2020) 324 mL 0  °• OXYGEN Inhale into the lungs. 02 at bedtime    °• sucralfate (CARAFATE) 1 g tablet Take 1 tablet (1 g total) by mouth 4 (four) times daily -  with meals and at bedtime. 120 tablet 3  ° ° °Blood pressure 135/74, pulse 70, temperature 98.2 °F (36.8 °C), resp. rate 19, height 6' 0.99" (1.854  m), weight 109.8 kg, SpO2 98 %. °Physical Exam:  °General: WD, obese white female who is laying in bed in clear discomfort °HEENT:    Ears and nose without any masses or lesions.  Mouth is pink and moist °Heart: regular, rate, and rhythm.  Normal s1,s2. No obvious murmurs, gallops, or rubs noted.  Palpable radial and pedal pulses bilaterally °Lungs: CTAB, no wheezes, rhonchi, or rales noted.  Respiratory effort nonlabored °Abd: soft, very ttp in RUQ, +Murphy sign, ND, +BS °MS: all 4 extremities are symmetrical with no cyanosis, clubbing, or edema. °Skin: warm and dry with no masses, lesions, or rashes °Neuro: Cranial nerves 2-12 grossly intact, sensation grossly intact throughout °Psych: A&Ox3 with an appropriate affect.  ° °Results for orders   placed or performed during the hospital encounter of 05/16/20 (from the past 48 hour(s))  °Lipase, blood     Status: None  ° Collection Time: 05/16/20  2:42 PM  °Result Value Ref Range  ° Lipase 23 11 - 51 U/L  °  Comment: Performed at [facility], [address] [place], [place] [address]  °Comprehensive metabolic panel     Status: Abnormal  ° Collection Time: 05/16/20  2:42 PM  °Result Value Ref Range  ° Sodium 141 135 - 145 mmol/L  ° Potassium 4.1 3.5 - 5.1 mmol/L  ° Chloride 103 98 - 111 mmol/L  ° CO2 29 22 - 32 mmol/L  ° Glucose, Bld 114 (H) 70 - 99 mg/dL  °  Comment: Glucose reference range applies only to samples taken after fasting for at least 8 hours.  ° BUN 22 8 - 23 mg/dL  ° Creatinine, Ser 0.95 0.44 - 1.00 mg/dL  ° Calcium 8.6 (L) 8.9 - 10.3 mg/dL  ° Total Protein 6.8 6.5 - 8.1 g/dL  ° Albumin 3.4 (L) 3.5 - 5.0 g/dL  ° AST 16 15 - 41 U/L  ° ALT 13 0 - 44 U/L  ° Alkaline Phosphatase 109 38 - 126 U/L  ° Total Bilirubin 0.5 0.3 - 1.2 mg/dL  ° GFR calc non Af Amer >60 >60 mL/min  ° GFR calc Af Amer >60 >60 mL/min  ° Anion gap 9 5 - 15  °  Comment: Performed at [facility], [address] [place], [place] [address]  °CBC      Status: Abnormal  ° Collection Time: 05/16/20  2:42 PM  °Result Value Ref Range  ° WBC 10.2 4.0 - 10.5 K/uL  ° RBC 4.33 3.87 - 5.11 MIL/uL  ° Hemoglobin 10.9 (L) 12.0 - 15.0 g/dL  ° HCT 35.7 (L) 36.0 - 46.0 %  ° MCV 82.4 80.0 - 100.0 fL  ° MCH 25.2 (L) 26.0 - 34.0 pg  ° MCHC 30.5 30.0 - 36.0 g/dL  ° RDW 15.6 (H) 11.5 - 15.5 %  ° Platelets 296 150 - 400 K/uL  ° nRBC 0.0 0.0 - 0.2 %  °  Comment: Performed at [facility], [address] [place], [place] [address]  °Urinalysis, Routine w reflex microscopic     Status: Abnormal  ° Collection Time: 05/16/20  9:00 PM  °Result Value Ref Range  ° Color, Urine YELLOW YELLOW  ° APPearance HAZY (A) CLEAR  ° Specific Gravity, Urine 1.031 (H) 1.005 - 1.030  ° pH 7.0 5.0 - 8.0  ° Glucose, UA NEGATIVE NEGATIVE mg/dL  ° Hgb urine dipstick SMALL (A) NEGATIVE  ° Bilirubin Urine NEGATIVE NEGATIVE  ° Ketones, ur NEGATIVE NEGATIVE mg/dL  ° Protein, ur NEGATIVE NEGATIVE mg/dL  ° Nitrite NEGATIVE NEGATIVE  ° Leukocytes,Ua LARGE (A) NEGATIVE  ° RBC / HPF 0-5 0 - 5 RBC/hpf  ° WBC, UA >50 (H) 0 - 5 WBC/hpf  ° Bacteria, UA RARE (A) NONE SEEN  ° Squamous Epithelial / LPF 11-20 0 - 5  ° Mucus PRESENT   °  Comment: Performed at [facility], [address] [place], [place] [address]  °Urine culture     Status: Abnormal (Preliminary result)  ° Collection Time: 05/16/20  9:00 PM  ° Specimen: Urine, Clean Catch  °Result Value Ref Range  ° Specimen Description    °  URINE, CLEAN CATCH °Performed at [facility], [address] [place], [place] [address] °  ° Special Requests    °  NONE °Performed at [facility], [address] [place], [place] [address] °  ° Culture >=100,000 COLONIES/mL GRAM NEGATIVE RODS (A)   ° Report Status PENDING   °Glucose, capillary     Status: Abnormal  ° Collection Time: 05/17/20 12:49 AM  °Result Value Ref Range  ° Glucose-Capillary 107 (H) 70 - 99 mg/dL  °  Comment: Glucose reference range  applies only to samples taken after fasting for at least 8 hours.  °HIV Antibody (routine testing w rflx)     Status: None  ° Collection Time: 05/17/20  5:13 AM  °Result Value Ref Range  ° HIV Screen 4th Generation wRfx Non Reactive Non Reactive  °  Comment: Performed at [facility], [address] [place], [place] [address]  °Basic metabolic panel     Status: Abnormal  ° Collection Time: 05/17/20  5:13 AM  °Result Value Ref Range  ° Sodium 143 135 - 145 mmol/L  ° Potassium 3.8 3.5 - 5.1 mmol/L  ° Chloride 105 98 - 111 mmol/L  ° CO2 31 22 - 32 mmol/L  ° Glucose, Bld 123 (H) 70 - 99 mg/dL  °  Comment: Glucose reference range applies only to samples taken after fasting for at least 8 hours.  ° BUN 20 8 - 23 mg/dL  ° Creatinine, Ser 0.96 0.44 - 1.00 mg/dL  ° Calcium 8.3 (L) 8.9 - 10.3 mg/dL  ° GFR calc non Af Amer >60 >60 mL/min  ° GFR calc Af Amer >60 >60 mL/min  ° Anion gap 7 5 - 15  °  Comment: Performed at [facility], [address] [place], [place] [address]  °CBC     Status: Abnormal  ° Collection Time: 05/17/20  5:13 AM  °Result Value Ref Range  ° WBC 9.2 4.0 - 10.5 K/uL  ° RBC 4.09 3.87 - 5.11 MIL/uL  ° Hemoglobin 10.2 (L) 12.0 - 15.0 g/dL  ° HCT 34.8 (L) 36.0 - 46.0 %  ° MCV 85.1 80.0 - 100.0 fL  ° MCH 24.9 (L) 26.0 - 34.0 pg  ° MCHC 29.3 (L) 30.0 - 36.0 g/dL  ° RDW 15.9 (H) 11.5 - 15.5 %  ° Platelets 270 150 - 400 K/uL  ° nRBC 0.0 0.0 - 0.2 %  °  Comment: Performed at [facility], [address] [place], [place] [address]  °Hepatic function panel     Status: Abnormal  ° Collection Time: 05/17/20  5:13 AM  °Result Value Ref Range  ° Total Protein 6.7 6.5 - 8.1 g/dL  ° Albumin 3.5 3.5 - 5.0 g/dL  ° AST 14 (L) 15 - 41 U/L  ° ALT 13 0 - 44 U/L  ° Alkaline Phosphatase 103 38 - 126 U/L  ° Total Bilirubin 0.5 0.3 - 1.2 mg/dL  ° Bilirubin, Direct <0.1 0.0 - 0.2 mg/dL  ° Indirect Bilirubin NOT CALCULATED 0.3 - 0.9 mg/dL  °  Comment: Performed at [facility], [address] [place], [place] [address]  °Glucose, capillary     Status: Abnormal  ° Collection Time: 05/17/20  7:41 AM  °Result Value Ref Range  ° Glucose-Capillary 101 (H) 70 - 99 mg/dL  °  Comment: Glucose reference range applies only to samples taken after fasting for at least 8 hours.  °SARS Coronavirus 2 by RT PCR (hospital order, performed in   [place] hospital lab) Nasopharyngeal Nasopharyngeal Swab     Status: None  ° Collection Time: 05/17/20  7:49 AM  ° Specimen: Nasopharyngeal Swab  °Result Value Ref Range  ° SARS Coronavirus 2 NEGATIVE NEGATIVE  °  Comment: (NOTE) °SARS-CoV-2 target nucleic acids are NOT DETECTED. °The SARS-CoV-2 RNA is generally detectable in upper and lower °respiratory specimens during the acute phase of infection. The lowest °concentration of SARS-CoV-2 viral copies this assay can detect is 250 °copies / mL. A negative result does not preclude SARS-CoV-2 infection °and should not be used as the sole basis for treatment or other °patient management decisions.  A negative result may occur with °improper specimen collection / handling, submission of specimen other °than nasopharyngeal swab, presence of viral mutation(s) within the °areas targeted by this assay, and inadequate number of viral copies °(<250 copies / mL). A negative result must be combined with clinical °observations, patient history, and epidemiological information. °Fact Sheet for Patients:   °https://www.fda.gov/media/136312/download °Fact Sheet for Healthcare Providers: °https://www.fda.gov/media/136313/download °This test is not yet approved or cleared  by the [place] FDA and °has been authorized for detection and/or diagnosis of SARS-CoV-2 by °FDA under an Emergency Use Authorization (EUA).  This EUA will remain °in effect (meaning this test can be used) for the duration of the °COVID-19 declaration under Section 564(b)(1) of the Act, 21 U.S.C. °section 360bbb-3(b)(1), unless the authorization  is terminated or °revoked sooner. °Performed at [facility], [address] °[place], [place] [address] °  °Glucose, capillary     Status: Abnormal  ° Collection Time: 05/18/20 12:44 AM  °Result Value Ref Range  ° Glucose-Capillary 119 (H) 70 - 99 mg/dL  °  Comment: Glucose reference range applies only to samples taken after fasting for at least 8 hours.  °CBC     Status: Abnormal  ° Collection Time: 05/18/20  5:53 AM  °Result Value Ref Range  ° WBC 7.5 4.0 - 10.5 K/uL  ° RBC 3.76 (L) 3.87 - 5.11 MIL/uL  ° Hemoglobin 9.4 (L) 12.0 - 15.0 g/dL  ° HCT 32.0 (L) 36.0 - 46.0 %  ° MCV 85.1 80.0 - 100.0 fL  ° MCH 25.0 (L) 26.0 - 34.0 pg  ° MCHC 29.4 (L) 30.0 - 36.0 g/dL  ° RDW 15.9 (H) 11.5 - 15.5 %  ° Platelets 238 150 - 400 K/uL  ° nRBC 0.0 0.0 - 0.2 %  °  Comment: Performed at [facility], [address] [place], [place] [address]  °Comprehensive metabolic panel     Status: Abnormal  ° Collection Time: 05/18/20  5:53 AM  °Result Value Ref Range  ° Sodium 140 135 - 145 mmol/L  ° Potassium 3.5 3.5 - 5.1 mmol/L  ° Chloride 103 98 - 111 mmol/L  ° CO2 29 22 - 32 mmol/L  ° Glucose, Bld 112 (H) 70 - 99 mg/dL  °  Comment: Glucose reference range applies only to samples taken after fasting for at least 8 hours.  ° BUN 17 8 - 23 mg/dL  ° Creatinine, Ser 0.76 0.44 - 1.00 mg/dL  ° Calcium 8.2 (L) 8.9 - 10.3 mg/dL  ° Total Protein 5.9 (L) 6.5 - 8.1 g/dL  ° Albumin 3.0 (L) 3.5 - 5.0 g/dL  ° AST 10 (L) 15 - 41 U/L  ° ALT 11 0 - 44 U/L  ° Alkaline Phosphatase 92 38 - 126 U/L  ° Total Bilirubin 0.4 0.3 - 1.2 mg/dL  ° GFR   calc non Af Amer >60 >60 mL/min  ° GFR calc Af Amer >60 >60 mL/min  ° Anion gap 8 5 - 15  °  Comment: Performed at [facility], [address] [place], [place] [address]  °Magnesium     Status: None  ° Collection Time: 05/18/20  5:53 AM  °Result Value Ref Range  ° Magnesium 2.1 1.7 - 2.4 mg/dL  °  Comment: Performed at [facility], [address] [place], [place] [address]  °Phosphorus     Status: None  ° Collection Time: 05/18/20  5:53 AM  °Result Value Ref Range  ° Phosphorus 3.3 2.5 - 4.6 mg/dL  °  Comment: Performed at [facility], [address] [place], [place] [address]  °Glucose, capillary     Status: Abnormal  ° Collection Time: 05/18/20  7:43 AM  °Result Value Ref Range  ° Glucose-Capillary 113 (H) 70 - 99 mg/dL  °  Comment: Glucose reference range applies only to samples taken after fasting for at least 8 hours.  ° Comment 1 Notify RN   ° Comment 2 Document in Chart   ° Comment 3 Repeat Test   ° °NM Hepatobiliary Liver Func ° °Result Date: 05/17/2020 °CLINICAL DATA:  Right upper quadrant pain, cholelithiasis EXAM: NUCLEAR MEDICINE HEPATOBILIARY IMAGING TECHNIQUE: Sequential images of the abdomen were obtained out to 60 minutes following intravenous administration of radiopharmaceutical. RADIOPHARMACEUTICALS:  4.65 mCi Tc-99m  Choletec IV COMPARISON:  CT abdomen pelvis, 05/16/2020 FINDINGS: Prompt uptake and biliary excretion of activity by the liver is seen. Gallbladder activity is visualized at 10 minutes, consistent with patency of cystic duct. Biliary activity passes into small bowel at 35 minutes, consistent with patent common bile duct. IMPRESSION: The cystic and common bile ducts are patent. Electronically Signed   By: Alex  Bibbey M.D.   On: 05/17/2020 12:38  ° °CT ABDOMEN PELVIS W CONTRAST ° °Result Date: 05/16/2020 °CLINICAL DATA:  68-year-old female with right upper quadrant abdominal pain. EXAM: CT ABDOMEN AND PELVIS WITH CONTRAST TECHNIQUE: Multidetector CT imaging of the abdomen and pelvis was performed using the standard protocol following bolus administration of intravenous contrast. CONTRAST:  100mL OMNIPAQUE IOHEXOL 300 MG/ML  SOLN COMPARISON:  CT abdomen pelvis dated 03/28/2020. FINDINGS: Lower chest: The visualized lung bases are clear. No intra-abdominal free air or free fluid. Hepatobiliary: The  liver is unremarkable. No intrahepatic biliary ductal dilatation. There is a gallstone. No pericholecystic fluid or evidence of acute cholecystitis. Pancreas: Unremarkable. No pancreatic ductal dilatation or surrounding inflammatory changes. Spleen: Normal in size without focal abnormality. Adrenals/Urinary Tract: The adrenal glands are unremarkable. There is no hydronephrosis on either side. There is symmetric enhancement and excretion of contrast by both kidneys. The visualized ureters and urinary bladder appear unremarkable. Stomach/Bowel: There is sigmoid diverticulosis and several scattered colonic diverticula without active inflammatory changes. There is no bowel obstruction or active inflammation. Appendectomy. Vascular/Lymphatic: Mild aortoiliac atherosclerotic disease. The IVC is unremarkable. No portal venous gas. There is no adenopathy. Reproductive: Hysterectomy. Other: None Musculoskeletal: No acute osseous pathology. IMPRESSION: 1. No acute intra-abdominal or pelvic pathology. 2. Cholelithiasis. 3. Colonic diverticulosis. 4. Aortic Atherosclerosis (ICD10-I70.0). Electronically Signed   By: Arash  Radparvar M.D.   On: 05/16/2020 18:46  ° °US Abdomen Limited RUQ ° °Result Date: 05/16/2020 °CLINICAL DATA:  Right upper quadrant pain EXAM: ULTRASOUND ABDOMEN LIMITED RIGHT UPPER QUADRANT COMPARISON:  05/06/2020 FINDINGS: Gallbladder: No gallstones. Top normal gallbladder wall thickness. No sonographic Murphy sign noted   by sonographer. Common bile duct: Diameter: 3.9 mm Liver: No focal lesion identified. Increased hepatic parenchymal echogenicity. Portal vein is patent on color Doppler imaging with normal direction of blood flow towards the liver. Other: None. IMPRESSION: 1. No cholelithiasis or sonographic evidence of acute cholecystitis. Previously demonstrated 10 mm gallstone is not visualized on the current examination and may reflect recent passage of the stone. 2. Increased hepatic echogenicity as can be  seen with hepatic steatosis. Electronically Signed   By: Hetal  Patel   On: 05/16/2020 16:37  ° ° ° ° °Assessment/Plan °CHF °HTN °COPD °CKD stage III °Chronic constipation °GERD and dysphagia °Hx of benign breast tumor ° °RUQ pain °Nausea and vomiting °Cholelithiasis °- large gallstone 1.1 cm seen on initial US but not seen on US yesterday. Large gallstone noted on CT yesterday so I don't think that she has passed stone. HIDA negative for acute cholecystitis °- patient acutely very ttp in RUQ °- discussed with GI and internal medicine who agree with lap chole today °- discussed with patient and explained that I can't guarantee that this will make symptoms better but that large gallstone seems the most likely etiology for current symptoms, she is agreeable to proceed °- to OR later today for lap chole  ° °FEN: NPO, IVF °VTE: SCDs °ID: rocephin 6/1>> ° °Eleno Weimar R Takera Rayl, PA-C °[facility] °05/18/2020, 10:34 AM °Please see Amion for pager number during day hours 7:00am-4:30pm ° ° °

## 2020-05-18 NOTE — Progress Notes (Addendum)
[facility] Progress Note  CC:  RUQ abdominal pain, nausea and vomiting  Subjective:  Supposed to have UGI this AM, but has not been able to perform due to nausea and vomiting (dry heaves) all morning.  Zofran not helping so just receiving dose of phenergan as well as some dilaudid.  Complaining of a lot of RUQ abdominal pain as well.  Objective:  Vital signs in last 24 hours: Temp:  [98.1 F (36.7 C)-98.4 F (36.9 C)] 98.2 F (36.8 C) (06/02 0449) Pulse Rate:  [69-72] 70 (06/02 0449) Resp:  [16-19] 19 (06/02 0449) BP: (135-171)/(73-99) 135/74 (06/02 0449) SpO2:  [96 %-100 %] 98 % (06/02 0449) Weight:  [109.8 kg] 109.8 kg (06/01 1108) Last BM Date: 05/15/20 General:  Alert, Well-developed, uncomfortable, dry heaving. Heart:  Regular rate and rhythm; no murmurs Pulm:  CTAB.  No increased WOB. Abdomen:  Soft, non-distended.  BS present.  RUQ TTP. Extremities:  Without edema. Neurologic:  Alert and oriented x 4;  grossly normal neurologically. Psych:  Alert and cooperative. Normal mood and affect.  Intake/Output from previous day: 06/01 0701 - 06/02 0700 In: 564.1 [I.V.:464.1; IV Piggyback:100] Out: 450 [Urine:450]  Lab Results: Recent Labs    05/16/20 1442 05/17/20 0513 05/18/20 0553  WBC 10.2 9.2 7.5  HGB 10.9* 10.2* 9.4*  HCT 35.7* 34.8* 32.0*  PLT 296 270 238   BMET Recent Labs    05/16/20 1442 05/17/20 0513 05/18/20 0553  NA 141 143 140  K 4.1 3.8 3.5  CL 103 105 103  CO2 29 31 29   GLUCOSE 114* 123* 112*  BUN 22 20 17   CREATININE 0.95 0.96 0.76  CALCIUM 8.6* 8.3* 8.2*   LFT Recent Labs    05/17/20 0513 05/17/20 0513 05/18/20 0553  PROT 6.7   < > 5.9*  ALBUMIN 3.5   < > 3.0*  AST 14*   < > 10*  ALT 13   < > 11  ALKPHOS 103   < > 92  BILITOT 0.5   < > 0.4  BILIDIR <0.1  --   --   IBILI NOT CALCULATED  --   --    < > = values in this interval not displayed.   NM Hepatobiliary Liver Func  Result Date: 05/17/2020 CLINICAL  DATA:  Right upper quadrant pain, cholelithiasis EXAM: NUCLEAR MEDICINE HEPATOBILIARY IMAGING TECHNIQUE: Sequential images of the abdomen were obtained out to 60 minutes following intravenous administration of radiopharmaceutical. RADIOPHARMACEUTICALS:  4.65 mCi Tc-32m  Choletec IV COMPARISON:  CT abdomen pelvis, 05/16/2020 FINDINGS: Prompt uptake and biliary excretion of activity by the liver is seen. Gallbladder activity is visualized at 10 minutes, consistent with patency of cystic duct. Biliary activity passes into small bowel at 35 minutes, consistent with patent common bile duct. IMPRESSION: The cystic and common bile ducts are patent. Electronically Signed   By: Eddie Candle M.D.   On: 05/17/2020 12:38   CT ABDOMEN PELVIS W CONTRAST  Result Date: 05/16/2020 CLINICAL DATA:  69 year old female with right upper quadrant abdominal pain. EXAM: CT ABDOMEN AND PELVIS WITH CONTRAST TECHNIQUE: Multidetector CT imaging of the abdomen and pelvis was performed using the standard protocol following bolus administration of intravenous contrast. CONTRAST:  140mL OMNIPAQUE IOHEXOL 300 MG/ML  SOLN COMPARISON:  CT abdomen pelvis dated 03/28/2020. FINDINGS: Lower chest: The visualized lung bases are clear. No intra-abdominal free air or free fluid. Hepatobiliary: The liver is unremarkable. No intrahepatic biliary ductal dilatation. There is a gallstone. No  pericholecystic fluid or evidence of acute cholecystitis. Pancreas: Unremarkable. No pancreatic ductal dilatation or surrounding inflammatory changes. Spleen: Normal in size without focal abnormality. Adrenals/Urinary Tract: The adrenal glands are unremarkable. There is no hydronephrosis on either side. There is symmetric enhancement and excretion of contrast by both kidneys. The visualized ureters and urinary bladder appear unremarkable. Stomach/Bowel: There is sigmoid diverticulosis and several scattered colonic diverticula without active inflammatory changes. There is  no bowel obstruction or active inflammation. Appendectomy. Vascular/Lymphatic: Mild aortoiliac atherosclerotic disease. The IVC is unremarkable. No portal venous gas. There is no adenopathy. Reproductive: Hysterectomy. Other: None Musculoskeletal: No acute osseous pathology. IMPRESSION: 1. No acute intra-abdominal or pelvic pathology. 2. Cholelithiasis. 3. Colonic diverticulosis. 4. Aortic Atherosclerosis (ICD10-I70.0). Electronically Signed   By: Anner Crete M.D.   On: 05/16/2020 18:46   US Abdomen Limited RUQ  Result Date: 05/16/2020 CLINICAL DATA:  Right upper quadrant pain EXAM: ULTRASOUND ABDOMEN LIMITED RIGHT UPPER QUADRANT COMPARISON:  05/06/2020 FINDINGS: Gallbladder: No gallstones. Top normal gallbladder wall thickness. No sonographic Murphy sign noted by sonographer. Common bile duct: Diameter: 3.9 mm Liver: No focal lesion identified. Increased hepatic parenchymal echogenicity. Portal vein is patent on color Doppler imaging with normal direction of blood flow towards the liver. Other: None. IMPRESSION: 1. No cholelithiasis or sonographic evidence of acute cholecystitis. Previously demonstrated 10 mm gallstone is not visualized on the current examination and may reflect recent passage of the stone. 2. Increased hepatic echogenicity as can be seen with hepatic steatosis. Electronically Signed   By: Kathreen Devoid   On: 05/16/2020 16:37   Assessment / Plan: 69 year old female with complaints of right-sided abdominal pain with associated nausea and vomiting.  Previously had a 10 mm solitary gallstone noted, but is not seen on ultrasound yesterday.  HIDA scan negative.  GERD and dysphagia:  Supposed to have upper GI series this AM to further evaluate pyloric channel stenosis and any significant delay in flow of contrast through the stomach, but she has not been able to have it done due to severe nausea and vomiting.  Chronic idiopathic constipation: Doing well overall on Linzess 145 mcg daily  as an outpatient.  Will resume this when she is taking p.o. meds.  Iron deficiency anemia:  Hgb is stable.  Colonoscopy and possible repeat EGD likely as scheduled as an outpatient but will await results of the above studies and see how her hospital course goes.  -Upper GI series pending if she is able to perform. -Will tentatively put her on for EGD for tomorrow AM.  If she is able to complete the UGI and it is normal then we will cancel EGD. -We have requested a surgical consult for their opinion regarding possible cholecystectomy. -Continue pantoprazole to 40 mg IV twice daily. -Antiemetics prn.  IVF's and supportive care.   LOS: 1 day   Laban Emperor. Zehr  05/18/2020, 9:08 AM    Attending physician's note   I have taken an interval history, reviewed the chart and examined the patient. I agree with the Advanced Practitioner's note, impression and recommendations.   Complains of severe right upper quadrant pain.  She also has associated nausea, patient states she is having trouble breathing because of pain.  Unable to do upper GI series this morning  Significant abdominal right upper quadrant tenderness on exam  Surgery consult for evaluation for acute cholecystitis even though imaging negative other than large solitary gallstone  Recent EGD with no high risk lesion.  Will plan to proceed with upper  GI series when able to perform and consider repeat EGD based on the findings if needed. Colonoscopy as outpatient for iron deficiency anemia.   Damaris Hippo , MD [phone]

## 2020-05-18 NOTE — Transfer of Care (Signed)
Immediate Anesthesia Transfer of Care Note  Patient: Morgan [name]  Procedure(s) Performed: LAPAROSCOPIC CHOLECYSTECTOMY (N/A Abdomen)  Patient Location: PACU  Anesthesia Type:General  Level of Consciousness: awake and patient cooperative  Airway & Oxygen Therapy: Patient Spontanous Breathing and Patient connected to face mask  Post-op Assessment: Report given to RN and Post -op Vital signs reviewed and stable  Post vital signs: Reviewed and stable  Last Vitals:  Vitals Value Taken Time  BP    Temp    Pulse    Resp    SpO2      Last Pain:  Vitals:   05/18/20 1306  TempSrc:   PainSc: 6          Complications: No apparent anesthesia complications

## 2020-05-18 NOTE — Progress Notes (Signed)
Pt scheduled for upper GI series. C/o constant nausea and vomiting not relieved by Zofran. Per Radiology, pt will not be able to have upper GI series if cannot keep oral contrast down. MD paged for additional nausea med orders? Awaiting response.

## 2020-05-18 NOTE — Discharge Instructions (Signed)
[facility] ______[facility] °LAPAROSCOPIC SURGERY: POST OP INSTRUCTIONS °Always review your discharge instruction sheet given to you by the facility where your surgery was performed. °IF YOU HAVE DISABILITY OR FAMILY LEAVE FORMS, YOU MUST BRING THEM TO THE OFFICE FOR PROCESSING.   °DO NOT GIVE THEM TO YOUR DOCTOR. ° °1. A prescription for pain medication may be given to you upon discharge.  Take your pain medication as prescribed, if needed.  If narcotic pain medicine is not needed, then you may take acetaminophen (Tylenol) or ibuprofen (Advil) as needed. °2. Take your usually prescribed medications unless otherwise directed. °3. If you need a refill on your pain medication, please contact your pharmacy.  They will contact our office to request authorization. Prescriptions will not be filled after 5pm or on week-ends. °4. You should follow a light diet the first few days after arrival home, such as soup and crackers, etc.  Be sure to include lots of fluids daily. °5. Most patients will experience some swelling and bruising in the area of the incisions.  Ice packs will help.  Swelling and bruising can take several days to resolve.  °6. It is common to experience some constipation if taking pain medication after surgery.  Increasing fluid intake and taking a stool softener (such as Colace) will usually help or prevent this problem from occurring.  A mild laxative (Milk of Magnesia or Miralax) should be taken according to package instructions if there are no bowel movements after 48 hours. °7. Unless discharge instructions indicate otherwise, you may remove your bandages 24-48 hours after surgery, and you may shower at that time.  You may have steri-strips (small skin tapes) in place directly over the incision.  These strips should be left on the skin for 7-10 days.  If your surgeon used skin glue on the incision, you may shower in 24 hours.  The glue will flake off over the next 2-3 weeks.  Any sutures or  staples will be removed at the office during your follow-up visit. °8. ACTIVITIES:  You may resume regular (light) daily activities beginning the next day--such as daily self-care, walking, climbing stairs--gradually increasing activities as tolerated.  You may have sexual intercourse when it is comfortable.  Refrain from any heavy lifting or straining until approved by your doctor. °a. You may drive when you are no longer taking prescription pain medication, you can comfortably wear a seatbelt, and you can safely maneuver your car and apply brakes. °b. RETURN TO WORK:  __________________________________________________________ °9. You should see your doctor in the office for a follow-up appointment approximately 2-3 weeks after your surgery.  Make sure that you call for this appointment within a day or two after you arrive home to insure a convenient appointment time. °10. OTHER INSTRUCTIONS: __________________________________________________________________________________________________________________________ __________________________________________________________________________________________________________________________ °WHEN TO CALL YOUR DOCTOR: °1. Fever over 101.0 °2. Inability to urinate °3. Continued bleeding from incision. °4. Increased pain, redness, or drainage from the incision. °5. Increasing abdominal pain ° °The clinic staff is available to answer your questions during regular business hours.  Please don’t hesitate to call and ask to speak to one of the nurses for clinical concerns.  If you have a medical emergency, go to the nearest emergency room or call 911.  A surgeon from [facility] is always on call at the hospital. °[address], [address], [place], [place]  [address] ? [address] [address], [place], [place]   [address] °[phone] ? [phone] ? FAX [phone] °Web site:   [email or web address] °

## 2020-05-18 NOTE — Progress Notes (Addendum)
PROGRESS NOTE  SIMIYAH [name] [id number] DOB: 10/25/1951 DOA: 05/16/2020 PCP: [name], MD   LOS: 1 day   Brief narrative: As per HPI,   Morgan [name] is a 69 y.o. female with history of chronic abdominal pain, essential hypertension follows up with [facility] had recent EGD done on Apr 21, 2020 which showed benign-appearing pyloric stenosis for which patient underwent dilatation and had subsequently followed up with gastroenterologist presents to the ER because of worsening abdominal pain for few days withvomiting.ED Course: In the ER patient had CT abdomen and pelvis and sonogram of the abdomen which both showed cholelithiasis with no definite evidence of cholecystitis.  On exam, patient had right upper quadrant tenderness.  LFTs were normal with normal WBC count patient afebrile.  UA shows features concerning for UTI.  ER physician discussed with on-call gastroenterologist for [place] Dr. Lyndel Safe who advised HIDA scan.  Patient was then admitted to the hospital for further evaluation and treatment.    Assessment/Plan:  Principal Problem:   Abdominal pain Active Problems:   Essential hypertension   CKD (chronic kidney disease) stage 3, GFR 30-59 ml/min  Right upper quadrant pain CT scan of the abdomen shows cholelithiasis.  No cholecystitis.  HIDA scan shows patency of cystic duct.  GI on board.  CT Scan of the abdomen and pelvis showed no acute findings except calcified gallstone near the gallbladder.  Continue Protonix IV twice daily.  Continue antiemetics IV fluids.  Spoke with general surgery this morning.  Patient will be taken for cholecystectomy today.  Chronic kidney disease stage IIIa.  Monitor BMP.  History of GERD.  Dysphagia.  Continue PPI.  Idiopathic constipation.  On Linzess.  Continue.  Iron deficiency anemia.  Monitor CBC.  Essential hypertension t as needed IV hydralazine. Oral medications on hold at this time.  VTE Prophylaxis: SCD  Code Status: Full  code  Family Communication: None  Status is: Inpatient  The patient will require  inpatient because: Ongoing diagnostic testing needed not appropriate for outpatient work up, for cholecystectomy today  Dispo: The patient is from: Home              Anticipated d/c is to: Home              Anticipated d/c date is: 1 to 2 days, for cholecystectomy today              Patient currently is not medically stable to d/c.  Consultants:  GI  General surgery  Procedures:  HIDA scan  Antibiotics:  . Rocephin IV  Anti-infectives (From admission, onward)   Start     Dose/Rate Route Frequency Ordered Stop   05/17/20 0530  [MAR Hold]  cefTRIAXone (ROCEPHIN) 1 g in sodium chloride 0.9 % 100 mL IVPB     (MAR Hold since Wed 05/18/2020 at 1150.Hold Reason: Transfer to a Procedural area.)   1 g 200 mL/hr over 30 Minutes Intravenous Daily 05/17/20 0516       Subjective: Today, patient was seen and examined at bedside.  Complains of nausea persistent abdominal pain.  Was unable to lie down for upper GI series.   Objective: Vitals:   05/18/20 1200 05/18/20 1226  BP: (!) 149/84 (!) 142/77  Pulse: 67 69  Resp: 18 (!) 24  Temp: 98.2 F (36.8 C)   SpO2: 95% 94%    Intake/Output Summary (Last 24 hours) at 05/18/2020 1300 Last data filed at 05/17/2020 1500 Gross per 24 hour  Intake  564.11 ml  Output 450 ml  Net 114.11 ml   Filed Weights   05/17/20 1108  Weight: 109.8 kg   Body mass index is 31.93 kg/m.   Physical Exam:  GENERAL: Patient is alert awake and oriented.   Obese, in moderate distress due to pain and nausea HENT: No scleral pallor or icterus. Pupils equally reactive to light. Oral mucosa is dry NECK: is supple, no gross swelling noted. CHEST: Coarse breath sounds bilaterally  CVS: S1 and S2 heard, no murmur. Regular rate and rhythm.  ABDOMEN: Soft, diffusely tender abdomen mostly in the right upper quadrant, bowel sounds are present. EXTREMITIES: No edema. CNS: Cranial  nerves are intact. No focal motor deficits. SKIN: warm and dry without rashes.  Data Review: I have personally reviewed the following laboratory data and studies,  CBC: Recent Labs  Lab 05/16/20 1442 05/17/20 0513 05/18/20 0553  WBC 10.2 9.2 7.5  HGB 10.9* 10.2* 9.4*  HCT 35.7* 34.8* 32.0*  MCV 82.4 85.1 85.1  PLT 296 270 [id number]   Basic Metabolic Panel: Recent Labs  Lab 05/16/20 1442 05/17/20 0513 05/18/20 0553  NA 141 143 140  K 4.1 3.8 3.5  CL 103 105 103  CO2 29 31 29   GLUCOSE 114* 123* 112*  BUN 22 20 17   CREATININE 0.95 0.96 0.76  CALCIUM 8.6* 8.3* 8.2*  MG  --   --  2.1  PHOS  --   --  3.3   Liver Function Tests: Recent Labs  Lab 05/16/20 1442 05/17/20 0513 05/18/20 0553  AST 16 14* 10*  ALT 13 13 11   ALKPHOS 109 103 92  BILITOT 0.5 0.5 0.4  PROT 6.8 6.7 5.9*  ALBUMIN 3.4* 3.5 3.0*   Recent Labs  Lab 05/16/20 1442  LIPASE 23   No results for input(s): AMMONIA in the last 168 hours. Cardiac Enzymes: No results for input(s): CKTOTAL, CKMB, CKMBINDEX, TROPONINI in the last 168 hours. BNP (last 3 results) No results for input(s): BNP in the last 8760 hours.  ProBNP (last 3 results) No results for input(s): PROBNP in the last 8760 hours.  CBG: Recent Labs  Lab 05/17/20 0049 05/17/20 0741 05/18/20 0044 05/18/20 0743  GLUCAP 107* 101* 119* 113*   Recent Results (from the past 240 hour(s))  Urine culture     Status: Abnormal (Preliminary result)   Collection Time: 05/16/20  9:00 PM   Specimen: Urine, Clean Catch  Result Value Ref Range Status   Specimen Description   Final    URINE, CLEAN CATCH Performed at [facility], [place] [address], [place], [place] [address]    Special Requests   Final    NONE Performed at [facility], [place] [address] [place], [place] [address]    Culture >=100,000 COLONIES/mL GRAM NEGATIVE RODS (A)  Final   Report Status PENDING  Incomplete  SARS Coronavirus 2 by RT PCR  (hospital order, performed in [facility] hospital lab) Nasopharyngeal Nasopharyngeal Swab     Status: None   Collection Time: 05/17/20  7:49 AM   Specimen: Nasopharyngeal Swab  Result Value Ref Range Status   SARS Coronavirus 2 NEGATIVE NEGATIVE Final    Comment: (NOTE) SARS-CoV-2 target nucleic acids are NOT DETECTED. The SARS-CoV-2 RNA is generally detectable in upper and lower respiratory specimens during the acute phase of infection. The lowest concentration of SARS-CoV-2 viral copies this assay can detect is 250 copies / mL. A negative result does not preclude SARS-CoV-2 infection and should  not be used as the sole basis for treatment or other patient management decisions.  A negative result may occur with improper specimen collection / handling, submission of specimen other than nasopharyngeal swab, presence of viral mutation(s) within the areas targeted by this assay, and inadequate number of viral copies (<250 copies / mL). A negative result must be combined with clinical observations, patient history, and epidemiological information. Fact Sheet for Patients:   StrictlyIdeas.no Fact Sheet for Healthcare Providers: BankingDealers.co.za This test is not yet approved or cleared  by the [place] FDA and has been authorized for detection and/or diagnosis of SARS-CoV-2 by FDA under an Emergency Use Authorization (EUA).  This EUA will remain in effect (meaning this test can be used) for the duration of the COVID-19 declaration under Section 564(b)(1) of the Act, 21 U.S.C. section 360bbb-3(b)(1), unless the authorization is terminated or revoked sooner. Performed at [facility], [place] [address], [place],  [address]      Studies: NM Hepatobiliary Liver Func  Result Date: 05/17/2020 CLINICAL DATA:  Right upper quadrant pain, cholelithiasis EXAM: NUCLEAR MEDICINE HEPATOBILIARY IMAGING TECHNIQUE: Sequential  images of the abdomen were obtained out to 60 minutes following intravenous administration of radiopharmaceutical. RADIOPHARMACEUTICALS:  4.65 mCi Tc-96m  Choletec IV COMPARISON:  CT abdomen pelvis, 05/16/2020 FINDINGS: Prompt uptake and biliary excretion of activity by the liver is seen. Gallbladder activity is visualized at 10 minutes, consistent with patency of cystic duct. Biliary activity passes into small bowel at 35 minutes, consistent with patent common bile duct. IMPRESSION: The cystic and common bile ducts are patent. Electronically Signed   By: Eddie Candle M.D.   On: 05/17/2020 12:38   CT ABDOMEN PELVIS W CONTRAST  Result Date: 05/16/2020 CLINICAL DATA:  69 year old female with right upper quadrant abdominal pain. EXAM: CT ABDOMEN AND PELVIS WITH CONTRAST TECHNIQUE: Multidetector CT imaging of the abdomen and pelvis was performed using the standard protocol following bolus administration of intravenous contrast. CONTRAST:  132mL OMNIPAQUE IOHEXOL 300 MG/ML  SOLN COMPARISON:  CT abdomen pelvis dated 03/28/2020. FINDINGS: Lower chest: The visualized lung bases are clear. No intra-abdominal free air or free fluid. Hepatobiliary: The liver is unremarkable. No intrahepatic biliary ductal dilatation. There is a gallstone. No pericholecystic fluid or evidence of acute cholecystitis. Pancreas: Unremarkable. No pancreatic ductal dilatation or surrounding inflammatory changes. Spleen: Normal in size without focal abnormality. Adrenals/Urinary Tract: The adrenal glands are unremarkable. There is no hydronephrosis on either side. There is symmetric enhancement and excretion of contrast by both kidneys. The visualized ureters and urinary bladder appear unremarkable. Stomach/Bowel: There is sigmoid diverticulosis and several scattered colonic diverticula without active inflammatory changes. There is no bowel obstruction or active inflammation. Appendectomy. Vascular/Lymphatic: Mild aortoiliac atherosclerotic  disease. The IVC is unremarkable. No portal venous gas. There is no adenopathy. Reproductive: Hysterectomy. Other: None Musculoskeletal: No acute osseous pathology. IMPRESSION: 1. No acute intra-abdominal or pelvic pathology. 2. Cholelithiasis. 3. Colonic diverticulosis. 4. Aortic Atherosclerosis (ICD10-I70.0). Electronically Signed   By: Anner Crete M.D.   On: 05/16/2020 18:46   US Abdomen Limited RUQ  Result Date: 05/16/2020 CLINICAL DATA:  Right upper quadrant pain EXAM: ULTRASOUND ABDOMEN LIMITED RIGHT UPPER QUADRANT COMPARISON:  05/06/2020 FINDINGS: Gallbladder: No gallstones. Top normal gallbladder wall thickness. No sonographic Murphy sign noted by sonographer. Common bile duct: Diameter: 3.9 mm Liver: No focal lesion identified. Increased hepatic parenchymal echogenicity. Portal vein is patent on color Doppler imaging with normal direction of blood flow towards the liver. Other: None. IMPRESSION:  1. No cholelithiasis or sonographic evidence of acute cholecystitis. Previously demonstrated 10 mm gallstone is not visualized on the current examination and may reflect recent passage of the stone. 2. Increased hepatic echogenicity as can be seen with hepatic steatosis. Electronically Signed   By: Kathreen Devoid   On: 05/16/2020 16:37      Flora Lipps, MD  [facility] 05/18/2020

## 2020-05-18 NOTE — Consult Note (Signed)
[facility] Consult Note  Morgan [name] 05-09-1951  [id number].    Requesting MD: Nandigam  Chief Complaint/Reason for Consult: RUQ pain, cholelithiasis HPI:  Patient is a 69 year old female who has had intermittent symptoms of abdominal pain with nausea and vomiting for several weeks. She has been evaluated as an outpatient with [facility], had recent EGD done on Apr 21, 2020 which showed benign-appearing pyloric stenosis for which patient underwent dilatation. CT 4/12 showed a large gallstone, RUQ [place] 5/25 in GI office showed large gallstone again. She presented to the ED 5/31 with worsening abdominal pain, nausea and vomiting. Pain is in RUQ and is severe, does not go anywhere else. Repeat US 5/31 did not see gallstone but it was noted on CT same day. HIDA yesterday was negative for cholecystitis. LFTs within normal limits. Denies fever, chills, chest pain, SOB, urinary symptoms. PMH significant for constipation, GERD, CHF, HTN, CKD stage III, COPD. Echo in 2019 showed EF 55-65%. She reports she takes a blood thinner but I do not see any record of this in her chart and she states it would have been 3 days since taking. Past abdominal surgery includes appendectomy and hysterectomy. Allergic to demerol.   ROS: Review of Systems  Constitutional: Negative for chills and fever.  Respiratory: Negative for shortness of breath.   Cardiovascular: Negative for chest pain and palpitations.  Gastrointestinal: Positive for abdominal pain, constipation, nausea and vomiting. Negative for diarrhea.  Genitourinary: Negative for dysuria and urgency.  All other systems reviewed and are negative.   Family History  Problem Relation Age of Onset   Breast cancer Mother 31   Ovarian cancer Mother 53   Brain cancer Mother    Hypertension Father    Hyperlipidemia Father    Heart disease Father    Breast cancer Maternal Grandmother 94   Diabetes Paternal Grandmother    Breast cancer Paternal  Grandmother 38   Cancer Paternal Grandfather    Breast cancer Maternal Aunt 60   Breast cancer Maternal Aunt 60    Past Medical History:  Diagnosis Date   Allergy    Anemia    Anxiety    Arthritis    Depression    GERD (gastroesophageal reflux disease)    Hypertension    MVP (mitral valve prolapse)     Past Surgical History:  Procedure Laterality Date   ABDOMINAL HYSTERECTOMY     APPENDECTOMY     BALLOON DILATION N/A 04/21/2020   Procedure: BALLOON DILATION;  Surgeon: Milus Banister, MD;  Location: [facility];  Service: Endoscopy;  Laterality: N/A;  pyloric/ GI   BIOPSY  04/21/2020   Procedure: BIOPSY;  Surgeon: Milus Banister, MD;  Location: [facility];  Service: Endoscopy;;   BREAST SURGERY Right 1988   tumor removal   ESOPHAGOGASTRODUODENOSCOPY (EGD) WITH PROPOFOL N/A 04/21/2020   Procedure: ESOPHAGOGASTRODUODENOSCOPY (EGD) WITH PROPOFOL;  Surgeon: Milus Banister, MD;  Location: [facility];  Service: Endoscopy;  Laterality: N/A;   KNEE SURGERY  2005   2 times left   TUBAL LIGATION      Social History:  reports that she quit smoking about 2 years ago. Her smoking use included cigarettes. She has a 15.00 pack-year smoking history. She has never used smokeless tobacco. She reports previous alcohol use. She reports that she does not use drugs.  Allergies:  Allergies  Allergen Reactions   Demerol [Meperidine] Hives   Strawberry (Diagnostic) Hives   Wasp Venom Hives  Medications Prior to Admission  Medication Sig Dispense Refill   albuterol (VENTOLIN HFA) 108 (90 Base) MCG/ACT inhaler TAKE 2 PUFFS BY MOUTH EVERY 6 HOURS AS NEEDED FOR WHEEZE OR SHORTNESS OF BREATH (Patient taking differently: Inhale 2 puffs into the lungs every 6 (six) hours as needed for wheezing or shortness of breath. ) 18 g 5   aspirin 81 MG EC tablet Take 81 mg by mouth daily. daily     aspirin-acetaminophen-caffeine (EXCEDRIN MIGRAINE) 250-250-65 MG tablet Take 2  tablets by mouth every 6 (six) hours as needed for headache.     atorvastatin (LIPITOR) 40 MG tablet Take 40 mg by mouth daily.      carvedilol (COREG) 3.125 MG tablet Take 3.125 mg by mouth in the morning and at bedtime.     colestipol (COLESTID) 1 g tablet TAKE 1 TABLET (1 G TOTAL) BY MOUTH 2 (TWO) TIMES DAILY. 180 tablet 1   dexlansoprazole (DEXILANT) 60 MG capsule Take 1 capsule (60 mg total) by mouth daily. 30 capsule 3   esomeprazole (NEXIUM) 40 MG capsule Take 1 capsule by mouth twice daily. (Patient taking differently: Take 40 mg by mouth 2 (two) times daily. ) 60 capsule 6   FLUoxetine (PROZAC) 40 MG capsule Take 40 mg by mouth daily.     furosemide (LASIX) 40 MG tablet HOLD UNTIL seen by your doctor (Patient taking differently: Take 40 mg by mouth daily. ) 30 tablet    gabapentin (NEURONTIN) 400 MG capsule Take 400 mg by mouth 3 (three) times daily.      hydrOXYzine (ATARAX/VISTARIL) 25 MG tablet Take 12.5-25 mg by mouth 3 (three) times daily as needed for anxiety.     linaclotide (LINZESS) 145 MCG CAPS capsule Take 1 capsule (145 mcg total) by mouth daily before breakfast. 30 capsule 3   mirtazapine (REMERON) 45 MG tablet Take 45 mg by mouth at bedtime.      promethazine (PHENERGAN) 25 MG tablet Take 1 tablet (25 mg total) by mouth daily as needed for nausea or vomiting. 30 tablet 1   Ferrous Sulfate (IRON) 325 (65 Fe) MG TABS Take 1 tablet (325 mg total) by mouth daily. (Patient not taking: Reported on 05/16/2020) 90 each 0   Na Sulfate-K Sulfate-Mg Sulf (SUPREP BOWEL PREP KIT) 17.5-3.13-1.6 GM/177ML SOLN Take 1 kit by mouth as directed. (Patient not taking: Reported on 05/16/2020) 324 mL 0   OXYGEN Inhale into the lungs. 02 at bedtime     sucralfate (CARAFATE) 1 g tablet Take 1 tablet (1 g total) by mouth 4 (four) times daily -  with meals and at bedtime. 120 tablet 3    Blood pressure 135/74, pulse 70, temperature 98.2 F (36.8 C), resp. rate 19, height 6' 0.99" (1.854  m), weight 109.8 kg, SpO2 98 %. Physical Exam:  General: WD, obese white female who is laying in bed in clear discomfort HEENT:    Ears and nose without any masses or lesions.  Mouth is pink and moist Heart: regular, rate, and rhythm.  Normal s1,s2. No obvious murmurs, gallops, or rubs noted.  Palpable radial and pedal pulses bilaterally Lungs: CTAB, no wheezes, rhonchi, or rales noted.  Respiratory effort nonlabored Abd: soft, very ttp in RUQ, +Murphy sign, ND, +BS MS: all 4 extremities are symmetrical with no cyanosis, clubbing, or edema. Skin: warm and dry with no masses, lesions, or rashes Neuro: Cranial nerves 2-12 grossly intact, sensation grossly intact throughout Psych: A&Ox3 with an appropriate affect.   Results for orders  placed or performed during the hospital encounter of 05/16/20 (from the past 48 hour(s))  Lipase, blood     Status: None   Collection Time: 05/16/20  2:42 PM  Result Value Ref Range   Lipase 23 11 - 51 U/L    Comment: Performed at [facility], [place] [address], [place], [place] [address]  Comprehensive metabolic panel     Status: Abnormal   Collection Time: 05/16/20  2:42 PM  Result Value Ref Range   Sodium 141 135 - 145 mmol/L   Potassium 4.1 3.5 - 5.1 mmol/L   Chloride 103 98 - 111 mmol/L   CO2 29 22 - 32 mmol/L   Glucose, Bld 114 (H) 70 - 99 mg/dL    Comment: Glucose reference range applies only to samples taken after fasting for at least 8 hours.   BUN 22 8 - 23 mg/dL   Creatinine, Ser 0.95 0.44 - 1.00 mg/dL   Calcium 8.6 (L) 8.9 - 10.3 mg/dL   Total Protein 6.8 6.5 - 8.1 g/dL   Albumin 3.4 (L) 3.5 - 5.0 g/dL   AST 16 15 - 41 U/L   ALT 13 0 - 44 U/L   Alkaline Phosphatase 109 38 - 126 U/L   Total Bilirubin 0.5 0.3 - 1.2 mg/dL   GFR calc non Af Amer >60 >60 mL/min   GFR calc Af Amer >60 >60 mL/min   Anion gap 9 5 - 15    Comment: Performed at [facility], [place] [address] [place], [place] [address]  CBC      Status: Abnormal   Collection Time: 05/16/20  2:42 PM  Result Value Ref Range   WBC 10.2 4.0 - 10.5 K/uL   RBC 4.33 3.87 - 5.11 MIL/uL   Hemoglobin 10.9 (L) 12.0 - 15.0 g/dL   HCT 35.7 (L) 36.0 - 46.0 %   MCV 82.4 80.0 - 100.0 fL   MCH 25.2 (L) 26.0 - 34.0 pg   MCHC 30.5 30.0 - 36.0 g/dL   RDW 15.6 (H) 11.5 - 15.5 %   Platelets 296 150 - 400 K/uL   nRBC 0.0 0.0 - 0.2 %    Comment: Performed at [facility], [place] [address], [place], [place] [address]  Urinalysis, Routine w reflex microscopic     Status: Abnormal   Collection Time: 05/16/20  9:00 PM  Result Value Ref Range   Color, Urine YELLOW YELLOW   APPearance HAZY (A) CLEAR   Specific Gravity, Urine 1.031 (H) 1.005 - 1.030   pH 7.0 5.0 - 8.0   Glucose, UA NEGATIVE NEGATIVE mg/dL   Hgb urine dipstick SMALL (A) NEGATIVE   Bilirubin Urine NEGATIVE NEGATIVE   Ketones, ur NEGATIVE NEGATIVE mg/dL   Protein, ur NEGATIVE NEGATIVE mg/dL   Nitrite NEGATIVE NEGATIVE   Leukocytes,Ua LARGE (A) NEGATIVE   RBC / HPF 0-5 0 - 5 RBC/hpf   WBC, UA >50 (H) 0 - 5 WBC/hpf   Bacteria, UA RARE (A) NONE SEEN   Squamous Epithelial / LPF 11-20 0 - 5   Mucus PRESENT     Comment: Performed at [facility], [place] [address], [place], [place] [address]  Urine culture     Status: Abnormal (Preliminary result)   Collection Time: 05/16/20  9:00 PM   Specimen: Urine, Clean Catch  Result Value Ref Range   Specimen Description      URINE, CLEAN CATCH Performed at [facility], [place]  [address], [place], [place] [address]    Special Requests      NONE Performed at [facility], [place] [address], [place], [place] [address]    Culture >=100,000 COLONIES/mL GRAM NEGATIVE RODS (A)    Report Status PENDING   Glucose, capillary     Status: Abnormal   Collection Time: 05/17/20 12:49 AM  Result Value Ref Range   Glucose-Capillary 107 (H) 70 - 99 mg/dL    Comment: Glucose reference range  applies only to samples taken after fasting for at least 8 hours.  HIV Antibody (routine testing w rflx)     Status: None   Collection Time: 05/17/20  5:13 AM  Result Value Ref Range   HIV Screen 4th Generation wRfx Non Reactive Non Reactive    Comment: Performed at [facility], [place] [address] [place], [place] [address]  Basic metabolic panel     Status: Abnormal   Collection Time: 05/17/20  5:13 AM  Result Value Ref Range   Sodium 143 135 - 145 mmol/L   Potassium 3.8 3.5 - 5.1 mmol/L   Chloride 105 98 - 111 mmol/L   CO2 31 22 - 32 mmol/L   Glucose, Bld 123 (H) 70 - 99 mg/dL    Comment: Glucose reference range applies only to samples taken after fasting for at least 8 hours.   BUN 20 8 - 23 mg/dL   Creatinine, Ser 0.96 0.44 - 1.00 mg/dL   Calcium 8.3 (L) 8.9 - 10.3 mg/dL   GFR calc non Af Amer >60 >60 mL/min   GFR calc Af Amer >60 >60 mL/min   Anion gap 7 5 - 15    Comment: Performed at [facility], [place] [address], [place], [place] [address]  CBC     Status: Abnormal   Collection Time: 05/17/20  5:13 AM  Result Value Ref Range   WBC 9.2 4.0 - 10.5 K/uL   RBC 4.09 3.87 - 5.11 MIL/uL   Hemoglobin 10.2 (L) 12.0 - 15.0 g/dL   HCT 34.8 (L) 36.0 - 46.0 %   MCV 85.1 80.0 - 100.0 fL   MCH 24.9 (L) 26.0 - 34.0 pg   MCHC 29.3 (L) 30.0 - 36.0 g/dL   RDW 15.9 (H) 11.5 - 15.5 %   Platelets 270 150 - 400 K/uL   nRBC 0.0 0.0 - 0.2 %    Comment: Performed at [facility], [place] [address], [place],  [address]  Hepatic function panel     Status: Abnormal   Collection Time: 05/17/20  5:13 AM  Result Value Ref Range   Total Protein 6.7 6.5 - 8.1 g/dL   Albumin 3.5 3.5 - 5.0 g/dL   AST 14 (L) 15 - 41 U/L   ALT 13 0 - 44 U/L   Alkaline Phosphatase 103 38 - 126 U/L   Total Bilirubin 0.5 0.3 - 1.2 mg/dL   Bilirubin, Direct <0.1 0.0 - 0.2 mg/dL   Indirect Bilirubin NOT CALCULATED 0.3 - 0.9 mg/dL    Comment: Performed at [facility], [place] [address], [place],  [address]  Glucose, capillary     Status: Abnormal   Collection Time: 05/17/20  7:41 AM  Result Value Ref Range   Glucose-Capillary 101 (H) 70 - 99 mg/dL    Comment: Glucose reference range applies only to samples taken after fasting for at least 8 hours.  SARS Coronavirus 2 by RT PCR (hospital order, performed in  [facility] hospital lab) Nasopharyngeal Nasopharyngeal Swab     Status: None   Collection Time: 05/17/20  7:49 AM   Specimen: Nasopharyngeal Swab  Result Value Ref Range   SARS Coronavirus 2 NEGATIVE NEGATIVE    Comment: (NOTE) SARS-CoV-2 target nucleic acids are NOT DETECTED. The SARS-CoV-2 RNA is generally detectable in upper and lower respiratory specimens during the acute phase of infection. The lowest concentration of SARS-CoV-2 viral copies this assay can detect is 250 copies / mL. A negative result does not preclude SARS-CoV-2 infection and should not be used as the sole basis for treatment or other patient management decisions.  A negative result may occur with improper specimen collection / handling, submission of specimen other than nasopharyngeal swab, presence of viral mutation(s) within the areas targeted by this assay, and inadequate number of viral copies (<250 copies / mL). A negative result must be combined with clinical observations, patient history, and epidemiological information. Fact Sheet for Patients:   StrictlyIdeas.no Fact Sheet for Healthcare Providers: BankingDealers.co.za This test is not yet approved or cleared  by the [place] FDA and has been authorized for detection and/or diagnosis of SARS-CoV-2 by FDA under an Emergency Use Authorization (EUA).  This EUA will remain in effect (meaning this test can be used) for the duration of the COVID-19 declaration under Section 564(b)(1) of the Act, 21 U.S.C. section 360bbb-3(b)(1), unless the authorization  is terminated or revoked sooner. Performed at [facility], [place] [address], [place], [place] [address]   Glucose, capillary     Status: Abnormal   Collection Time: 05/18/20 12:44 AM  Result Value Ref Range   Glucose-Capillary 119 (H) 70 - 99 mg/dL    Comment: Glucose reference range applies only to samples taken after fasting for at least 8 hours.  CBC     Status: Abnormal   Collection Time: 05/18/20  5:53 AM  Result Value Ref Range   WBC 7.5 4.0 - 10.5 K/uL   RBC 3.76 (L) 3.87 - 5.11 MIL/uL   Hemoglobin 9.4 (L) 12.0 - 15.0 g/dL   HCT 32.0 (L) 36.0 - 46.0 %   MCV 85.1 80.0 - 100.0 fL   MCH 25.0 (L) 26.0 - 34.0 pg   MCHC 29.4 (L) 30.0 - 36.0 g/dL   RDW 15.9 (H) 11.5 - 15.5 %   Platelets 238 150 - 400 K/uL   nRBC 0.0 0.0 - 0.2 %    Comment: Performed at [facility], [place] [address], [place], [place] [address]  Comprehensive metabolic panel     Status: Abnormal   Collection Time: 05/18/20  5:53 AM  Result Value Ref Range   Sodium 140 135 - 145 mmol/L   Potassium 3.5 3.5 - 5.1 mmol/L   Chloride 103 98 - 111 mmol/L   CO2 29 22 - 32 mmol/L   Glucose, Bld 112 (H) 70 - 99 mg/dL    Comment: Glucose reference range applies only to samples taken after fasting for at least 8 hours.   BUN 17 8 - 23 mg/dL   Creatinine, Ser 0.76 0.44 - 1.00 mg/dL   Calcium 8.2 (L) 8.9 - 10.3 mg/dL   Total Protein 5.9 (L) 6.5 - 8.1 g/dL   Albumin 3.0 (L) 3.5 - 5.0 g/dL   AST 10 (L) 15 - 41 U/L   ALT 11 0 - 44 U/L   Alkaline Phosphatase 92 38 - 126 U/L   Total Bilirubin 0.4 0.3 - 1.2 mg/dL   GFR  calc non Af Amer >60 >60 mL/min   GFR calc Af Amer >60 >60 mL/min   Anion gap 8 5 - 15    Comment: Performed at [facility], [place] [address], [place], [place] [address]  Magnesium     Status: None   Collection Time: 05/18/20  5:53 AM  Result Value Ref Range   Magnesium 2.1 1.7 - 2.4 mg/dL    Comment: Performed at [facility], [place]  [address] [place], [place] [address]  Phosphorus     Status: None   Collection Time: 05/18/20  5:53 AM  Result Value Ref Range   Phosphorus 3.3 2.5 - 4.6 mg/dL    Comment: Performed at [facility], [place] [address], [place], [place] [address]  Glucose, capillary     Status: Abnormal   Collection Time: 05/18/20  7:43 AM  Result Value Ref Range   Glucose-Capillary 113 (H) 70 - 99 mg/dL    Comment: Glucose reference range applies only to samples taken after fasting for at least 8 hours.   Comment 1 Notify RN    Comment 2 Document in Chart    Comment 3 Repeat Test    NM Hepatobiliary Liver Func  Result Date: 05/17/2020 CLINICAL DATA:  Right upper quadrant pain, cholelithiasis EXAM: NUCLEAR MEDICINE HEPATOBILIARY IMAGING TECHNIQUE: Sequential images of the abdomen were obtained out to 60 minutes following intravenous administration of radiopharmaceutical. RADIOPHARMACEUTICALS:  4.65 mCi Tc-79m Choletec IV COMPARISON:  CT abdomen pelvis, 05/16/2020 FINDINGS: Prompt uptake and biliary excretion of activity by the liver is seen. Gallbladder activity is visualized at 10 minutes, consistent with patency of cystic duct. Biliary activity passes into small bowel at 35 minutes, consistent with patent common bile duct. IMPRESSION: The cystic and common bile ducts are patent. Electronically Signed   By: AEddie CandleM.D.   On: 05/17/2020 12:38   CT ABDOMEN PELVIS W CONTRAST  Result Date: 05/16/2020 CLINICAL DATA:  69year old female with right upper quadrant abdominal pain. EXAM: CT ABDOMEN AND PELVIS WITH CONTRAST TECHNIQUE: Multidetector CT imaging of the abdomen and pelvis was performed using the standard protocol following bolus administration of intravenous contrast. CONTRAST:  1051mOMNIPAQUE IOHEXOL 300 MG/ML  SOLN COMPARISON:  CT abdomen pelvis dated 03/28/2020. FINDINGS: Lower chest: The visualized lung bases are clear. No intra-abdominal free air or free fluid. Hepatobiliary: The  liver is unremarkable. No intrahepatic biliary ductal dilatation. There is a gallstone. No pericholecystic fluid or evidence of acute cholecystitis. Pancreas: Unremarkable. No pancreatic ductal dilatation or surrounding inflammatory changes. Spleen: Normal in size without focal abnormality. Adrenals/Urinary Tract: The adrenal glands are unremarkable. There is no hydronephrosis on either side. There is symmetric enhancement and excretion of contrast by both kidneys. The visualized ureters and urinary bladder appear unremarkable. Stomach/Bowel: There is sigmoid diverticulosis and several scattered colonic diverticula without active inflammatory changes. There is no bowel obstruction or active inflammation. Appendectomy. Vascular/Lymphatic: Mild aortoiliac atherosclerotic disease. The IVC is unremarkable. No portal venous gas. There is no adenopathy. Reproductive: Hysterectomy. Other: None Musculoskeletal: No acute osseous pathology. IMPRESSION: 1. No acute intra-abdominal or pelvic pathology. 2. Cholelithiasis. 3. Colonic diverticulosis. 4. Aortic Atherosclerosis (ICD10-I70.0). Electronically Signed   By: ArAnner Crete.D.   On: 05/16/2020 18:46   USKoreabdomen Limited RUQ  Result Date: 05/16/2020 CLINICAL DATA:  Right upper quadrant pain EXAM: ULTRASOUND ABDOMEN LIMITED RIGHT UPPER QUADRANT COMPARISON:  05/06/2020 FINDINGS: Gallbladder: No gallstones. Top normal gallbladder wall thickness. No sonographic Murphy sign noted  by sonographer. Common bile duct: Diameter: 3.9 mm Liver: No focal lesion identified. Increased hepatic parenchymal echogenicity. Portal vein is patent on color Doppler imaging with normal direction of blood flow towards the liver. Other: None. IMPRESSION: 1. No cholelithiasis or sonographic evidence of acute cholecystitis. Previously demonstrated 10 mm gallstone is not visualized on the current examination and may reflect recent passage of the stone. 2. Increased hepatic echogenicity as can be  seen with hepatic steatosis. Electronically Signed   By: Kathreen Devoid   On: 05/16/2020 16:37      Assessment/Plan CHF HTN COPD CKD stage III Chronic constipation GERD and dysphagia Hx of benign breast tumor  RUQ pain Nausea and vomiting Cholelithiasis - large gallstone 1.1 cm seen on initial [place] but not seen on [place] yesterday. Large gallstone noted on CT yesterday so I don't think that she has passed stone. HIDA negative for acute cholecystitis - patient acutely very ttp in RUQ - discussed with GI and internal medicine who agree with lap chole today - discussed with patient and explained that I can't guarantee that this will make symptoms better but that large gallstone seems the most likely etiology for current symptoms, she is agreeable to proceed - to OR later today for lap chole   FEN: NPO, IVF VTE: SCDs ID: [email or web address] 6/1>>  Norm Parcel, [facility] 05/18/2020, 10:34 AM Please see Amion for pager number during day hours 7:00am-4:30pm

## 2020-05-18 NOTE — Interval H&P Note (Signed)
History and Physical Interval Note:  05/18/2020 1:55 PM  Morgan [name]  has presented today for surgery, with the diagnosis of ACUTE CHOLELITHIASIS.  The various methods of treatment have been discussed with the patient and family. After consideration of risks, benefits and other options for treatment, the patient has consented to  Procedure(s): LAPAROSCOPIC CHOLECYSTECTOMY WITH POSSIBLE INTRAOPERATIVE CHOLANGIOGRAM (N/A) as a surgical intervention.  The patient's history has been reviewed, patient examined, no change in status, stable for surgery.  I have reviewed the patient's chart and labs.  Questions were answered to the patient's satisfaction.     [name]

## 2020-05-18 NOTE — Anesthesia Postprocedure Evaluation (Signed)
Anesthesia Post Note  Patient: Morgan [name]  Procedure(s) Performed: LAPAROSCOPIC CHOLECYSTECTOMY (N/A Abdomen)     Patient location during evaluation: PACU Anesthesia Type: General Level of consciousness: awake and alert and oriented Pain management: pain level controlled Vital Signs Assessment: post-procedure vital signs reviewed and stable Respiratory status: spontaneous breathing, nonlabored ventilation and respiratory function stable Cardiovascular status: blood pressure returned to baseline and stable Postop Assessment: no apparent nausea or vomiting Anesthetic complications: no    Last Vitals:  Vitals:   05/18/20 1615 05/18/20 1630  BP: (!) 141/81 (!) 141/86  Pulse: 72 75  Resp: (!) 21 14  Temp:    SpO2: 100% 95%    Last Pain:  Vitals:   05/18/20 1630  TempSrc:   PainSc: Asleep                 Seibert Keeter A.

## 2020-05-18 NOTE — Anesthesia Procedure Notes (Signed)
Procedure Name: Intubation Date/Time: 05/18/2020 2:13 PM Performed by: Claudia Desanctis, CRNA Pre-anesthesia Checklist: Patient identified, Emergency Drugs available, Suction available and Patient being monitored Patient Re-evaluated:Patient Re-evaluated prior to induction Oxygen Delivery Method: Circle system utilized Preoxygenation: Pre-oxygenation with 100% oxygen Induction Type: IV induction, Rapid sequence and Cricoid Pressure applied Ventilation: Mask ventilation without difficulty Laryngoscope Size: 2 and Miller Grade View: Grade I Tube type: Oral Tube size: 7.0 mm Number of attempts: 1 Airway Equipment and Method: Stylet Placement Confirmation: ETT inserted through vocal cords under direct vision,  positive ETCO2 and breath sounds checked- equal and bilateral Secured at: 22 cm Tube secured with: Tape Dental Injury: Teeth and Oropharynx as per pre-operative assessment

## 2020-05-18 NOTE — Progress Notes (Signed)
Canceling EGD for 6/3 that was previously planned before decision was made to go to the OR for cholecystectomy.

## 2020-05-19 ENCOUNTER — Encounter (HOSPITAL_COMMUNITY)
Admission: EM | Disposition: A | Discharge status: Home / Self Care | Payer: Self-pay | Source: Home / Self Care | Attending: Internal Medicine

## 2020-05-19 DIAGNOSIS — K811 Chronic cholecystitis: Secondary | ICD-10-CM

## 2020-05-19 DIAGNOSIS — K8000 Calculus of gallbladder with acute cholecystitis without obstruction: Secondary | ICD-10-CM

## 2020-05-19 LAB — COMPREHENSIVE METABOLIC PANEL
ALT: 16 U/L (ref 0–44)
AST: 25 U/L (ref 15–41)
Albumin: 3 g/dL — ABNORMAL LOW (ref 3.5–5.0)
Alkaline Phosphatase: 87 U/L (ref 38–126)
Anion gap: 9 (ref 5–15)
BUN: 22 mg/dL (ref 8–23)
CO2: 25 mmol/L (ref 22–32)
Calcium: 8.2 mg/dL — ABNORMAL LOW (ref 8.9–10.3)
Chloride: 101 mmol/L (ref 98–111)
Creatinine, Ser: 0.85 mg/dL (ref 0.44–1.00)
GFR calc Af Amer: >60 mL/min (ref >60)
GFR calc non Af Amer: >60 mL/min (ref >60)
Glucose, Bld: 100 mg/dL — ABNORMAL HIGH (ref 70–99)
Potassium: 4.3 mmol/L (ref 3.5–5.1)
Sodium: 135 mmol/L (ref 135–145)
Total Bilirubin: 0.5 mg/dL (ref 0.3–1.2)
Total Protein: 5.9 g/dL — ABNORMAL LOW (ref 6.5–8.1)

## 2020-05-19 LAB — URINE CULTURE: Culture: >=100000 — AB

## 2020-05-19 LAB — CBC
HCT: 32.3 % — ABNORMAL LOW (ref 36.0–46.0)
Hemoglobin: 9.6 g/dL — ABNORMAL LOW (ref 12.0–15.0)
MCH: 24.8 pg — ABNORMAL LOW (ref 26.0–34.0)
MCHC: 29.7 g/dL — ABNORMAL LOW (ref 30.0–36.0)
MCV: 83.5 fL (ref 80.0–100.0)
Platelets: 230 10*3/uL (ref 150–400)
RBC: 3.87 MIL/uL (ref 3.87–5.11)
RDW: 15.5 % (ref 11.5–15.5)
WBC: 7.9 10*3/uL (ref 4.0–10.5)
nRBC: 0 % (ref 0.0–0.2)

## 2020-05-19 LAB — SURGICAL PATHOLOGY

## 2020-05-19 LAB — GLUCOSE, CAPILLARY
Glucose-Capillary: 95 mg/dL (ref 70–99)
Glucose-Capillary: 92 mg/dL (ref 70–99)
Glucose-Capillary: 91 mg/dL (ref 70–99)

## 2020-05-19 SURGERY — ESOPHAGOGASTRODUODENOSCOPY (EGD) WITH PROPOFOL
Anesthesia: Monitor Anesthesia Care

## 2020-05-19 MED ORDER — HYDROXYZINE HCL 25 MG PO TABS: 12.5000 mg | ORAL_TABLET | Freq: Three times a day (TID) | ORAL | Status: DC | PRN

## 2020-05-19 MED ORDER — ASPIRIN EC 81 MG PO TBEC: 81.0000 mg | DELAYED_RELEASE_TABLET | Freq: Every day | ORAL | Status: DC

## 2020-05-19 MED ORDER — FLUOXETINE HCL 20 MG PO CAPS
40.0000 mg | ORAL_CAPSULE | Freq: Every day | ORAL | Status: DC
  Administered 2020-05-19 – 2020-05-20 (×2): 40 mg via ORAL
  Filled 2020-05-19 (×2): qty 2

## 2020-05-19 MED ORDER — SODIUM CHLORIDE 0.9 % IV SOLN: INTRAVENOUS | Status: DC

## 2020-05-19 MED ORDER — ASPIRIN-ACETAMINOPHEN-CAFFEINE 250-250-65 MG PO TABS
2.0000 | ORAL_TABLET | Freq: Four times a day (QID) | ORAL | Status: DC | PRN
  Filled 2020-05-19: qty 2

## 2020-05-19 MED ORDER — GABAPENTIN 400 MG PO CAPS
400.0000 mg | ORAL_CAPSULE | Freq: Three times a day (TID) | ORAL | Status: DC
  Administered 2020-05-19 – 2020-05-20 (×3): 400 mg via ORAL
  Filled 2020-05-19 (×3): qty 1

## 2020-05-19 MED ORDER — LINACLOTIDE 145 MCG PO CAPS
145.0000 ug | ORAL_CAPSULE | Freq: Every day | ORAL | Status: DC
  Administered 2020-05-20: 145 ug via ORAL
  Filled 2020-05-19: qty 1

## 2020-05-19 MED ORDER — MIRTAZAPINE 15 MG PO TABS
45.0000 mg | ORAL_TABLET | Freq: Every day | ORAL | Status: DC
  Administered 2020-05-19: 45 mg via ORAL
  Filled 2020-05-19: qty 3

## 2020-05-19 MED ORDER — PROMETHAZINE HCL 25 MG PO TABS: 25.0000 mg | ORAL_TABLET | Freq: Every day | ORAL | Status: DC | PRN

## 2020-05-19 MED ORDER — SUCRALFATE 1 G PO TABS
1.0000 g | ORAL_TABLET | Freq: Three times a day (TID) | ORAL | Status: DC
  Administered 2020-05-19 – 2020-05-20 (×4): 1 g via ORAL
  Filled 2020-05-19 (×4): qty 1

## 2020-05-19 NOTE — Progress Notes (Addendum)
PROGRESS NOTE  Morgan [name] [id number] DOB: 1951/11/27 DOA: 05/16/2020 PCP: [name], MD   LOS: 2 days   Brief narrative: As per HPI,   Morgan [name] is a 69 y.o. female with history of chronic abdominal pain, essential hypertension follows up with [facility] had recent EGD done on Apr 21, 2020 which showed benign-appearing pyloric stenosis for which patient underwent dilatation and had subsequently followed up with gastroenterologist presents to the ER because of worsening abdominal pain for few days withvomiting.  ED Course: In the ER patient had CT abdomen and pelvis and sonogram of the abdomen which both showed cholelithiasis with no definite evidence of cholecystitis.  On exam, patient had right upper quadrant tenderness.  LFTs were normal with normal WBC count patient afebrile.  UA shows features concerning for UTI.  ER physician discussed with on-call gastroenterologist for [facility] Dr. Lyndel Safe who advised HIDA scan.  Patient was then admitted to the hospital for further evaluation and treatment.    Assessment/Plan:  Principal Problem:   Abdominal pain Active Problems:   Essential hypertension   CKD (chronic kidney disease) stage 3, GFR 30-59 ml/min  Right upper quadrant pain CT scan of the abdomen showed cholelithiasis.    HIDA scan shows patency of cystic duct.  GI was consulted due to persistent symptoms.  Subsequently general surgery was also consulted for symptomatic cholelithiasis.  Patient underwent laparoscopic cholecystectomy on 05/18/2020 which showed findings history of chronic cholecystitis.  On presentation CT Scan of the abdomen and pelvis showed no acute findings except calcified gallstone near the gallbladder.  Continue Protonix IV twice daily.  Continue antiemetics IV fluids.  She has been advanced to full liquids today.  Patient still feels tenderness in her abdomen with mild nausea.  Does not feel completely ready for discharge.    Chronic kidney disease stage  IIIa.  Latest creatinine of 0.8.  History of COPD with chronic respiratory failure on 2 L of nasal cannula at baseline.  Continue incentive spirometry.  History of GERD.  Dysphagia.  Pyloric stenosis status post balloon dilation on 04/21/2020.  Seen by GI this admission.  Continue PPI, sucralfate..  Will need outpatient follow-up with GI after discharge.  Plan is to do colonoscopy and endoscopy as outpatient.  Patient is supposed to get EGD for further dilatation.  Patient does have an appointment to follow-up with GI on 06/01/2020 at 1:30 PM  Idiopathic constipation.  On Linzess.  Continue on discharge  Iron deficiency anemia.  Hemoglobin of 9.6.  Resume iron supplement on discharge.  Patient will receive a colonoscopy as outpatient with GI.  Essential hypertension.  Continue Coreg.  History of anxiety depression.  Will resume home medication with gabapentin, Prozac, Atarax, Neurontin  Abnormal urinalysis, bacteriuria.  Asymptomatic bacteria.  Urine culture showed ESBL E. coli.  Patient does not have urinary symptoms and is afebrile.  Was on IV Rocephin empirically.  Will discontinue.  VTE Prophylaxis: SCD  Code Status: Full code  Family Communication: None.  Patient stated that she will update her aunt.  Status is: Inpatient  The patient will require  inpatient because: status post cholecystectomy, started on full liquid today, still with epigastric pain and nausea, patient feels unsafe for discharge today, not at baseline.  Dispo: The patient is from: Home              Anticipated d/c is to: Home              Anticipated d/c date  is: Likely tomorrow.  Will resume home medications today.  On full liquids.  Will advance to solid diet by tomorrow.              Patient currently is not medically stable to d/c.  Consultants:  GI  General surgery  Procedures:  HIDA scan  Laparoscopic cholecystectomy with intraoperative cholangiogram on 05/18/2020 by Dr. Hassell Done general  surgery.  Antibiotics:  . Rocephin IV 6/1-6/3  Anti-infectives (From admission, onward)   Start     Dose/Rate Route Frequency Ordered Stop   05/17/20 0530  cefTRIAXone (ROCEPHIN) 1 g in sodium chloride 0.9 % 100 mL IVPB  Status:  Discontinued     1 g 200 mL/hr over 30 Minutes Intravenous Daily 05/17/20 0516 05/19/20 1106     Subjective: Today, patient was seen and examined at bedside.  Complains of mild epigastric pain but less nausea no vomiting.  Was on clears.  Has been started on full liquids.   Objective: Vitals:   05/18/20 2352 05/19/20 0548  BP: (!) 142/78 (!) 171/88  Pulse: 72 76  Resp: 18 17  Temp: 98.3 F (36.8 C) 97.9 F (36.6 C)  SpO2: 99% 99%    Intake/Output Summary (Last 24 hours) at 05/19/2020 1309 Last data filed at 05/19/2020 1045 Gross per 24 hour  Intake 2063.03 ml  Output 110 ml  Net 1953.03 ml   Filed Weights   05/17/20 1108  Weight: 109.8 kg   Body mass index is 31.93 kg/m.   Physical Exam:  GENERAL: Patient is alert awake and oriented.   Obese not in obvious distress .  On nasal cannula oxygen. HENT: No scleral pallor or icterus. Pupils equally reactive to light. Oral mucosa is moist NECK: is supple, no gross swelling noted. CHEST: Coarse breath sounds bilaterally , no wheezes noted. CVS: S1 and S2 heard, no murmur. Regular rate and rhythm.  ABDOMEN: Soft, appropriately tender tender abdomen post laparoscopic cholecystectomy bowel sounds are present. EXTREMITIES: No edema. CNS: Cranial nerves are intact. No focal motor deficits. SKIN: warm and dry without rashes.  Data Review: I have personally reviewed the following laboratory data and studies,  CBC: Recent Labs  Lab 05/16/20 1442 05/17/20 0513 05/18/20 0553 05/19/20 0544  WBC 10.2 9.2 7.5 7.9  HGB 10.9* 10.2* 9.4* 9.6*  HCT 35.7* 34.8* 32.0* 32.3*  MCV 82.4 85.1 85.1 83.5  PLT 296 270 238 [id number]   Basic Metabolic Panel: Recent Labs  Lab 05/16/20 1442 05/17/20 0513  05/18/20 0553 05/19/20 0544  NA 141 143 140 135  K 4.1 3.8 3.5 4.3  CL 103 105 103 101  CO2 29 31 29 25   GLUCOSE 114* 123* 112* 100*  BUN 22 20 17 22   CREATININE 0.95 0.96 0.76 0.85  CALCIUM 8.6* 8.3* 8.2* 8.2*  MG  --   --  2.1  --   PHOS  --   --  3.3  --    Liver Function Tests: Recent Labs  Lab 05/16/20 1442 05/17/20 0513 05/18/20 0553 05/19/20 0544  AST 16 14* 10* 25  ALT 13 13 11 16   ALKPHOS 109 103 92 87  BILITOT 0.5 0.5 0.4 0.5  PROT 6.8 6.7 5.9* 5.9*  ALBUMIN 3.4* 3.5 3.0* 3.0*   Recent Labs  Lab 05/16/20 1442  LIPASE 23   No results for input(s): AMMONIA in the last 168 hours. Cardiac Enzymes: No results for input(s): CKTOTAL, CKMB, CKMBINDEX, TROPONINI in the last 168 hours. BNP (last 3 results) No results for  input(s): BNP in the last 8760 hours.  ProBNP (last 3 results) No results for input(s): PROBNP in the last 8760 hours.  CBG: Recent Labs  Lab 05/18/20 0044 05/18/20 0743 05/18/20 1648 05/18/20 2351 05/19/20 0751  GLUCAP 119* 113* 103* 121* 95   Recent Results (from the past 240 hour(s))  Urine culture     Status: Abnormal   Collection Time: 05/16/20  9:00 PM   Specimen: Urine, Clean Catch  Result Value Ref Range Status   Specimen Description   Final    URINE, CLEAN CATCH Performed at [facility], [place] [address], [place], [place] [address]    Special Requests   Final    NONE Performed at [facility], [place] [address], [place], [place] [address]    Culture (A)  Final    >=100,000 COLONIES/mL ESCHERICHIA COLI Confirmed Extended Spectrum Beta-Lactamase Producer (ESBL).  In bloodstream infections from ESBL organisms, carbapenems are preferred over piperacillin/tazobactam. They are shown to have a lower risk of mortality.    Report Status 05/19/2020 FINAL  Final   Organism ID, Bacteria ESCHERICHIA COLI (A)  Final      Susceptibility   Escherichia coli - MIC*    AMPICILLIN >=32 RESISTANT Resistant      CEFAZOLIN >=64 RESISTANT Resistant     CEFTRIAXONE >=64 RESISTANT Resistant     CIPROFLOXACIN >=4 RESISTANT Resistant     GENTAMICIN <=1 SENSITIVE Sensitive     IMIPENEM <=0.25 SENSITIVE Sensitive     NITROFURANTOIN <=16 SENSITIVE Sensitive     TRIMETH/SULFA <=20 SENSITIVE Sensitive     AMPICILLIN/SULBACTAM 4 SENSITIVE Sensitive     PIP/TAZO <=4 SENSITIVE Sensitive     * >=100,000 COLONIES/mL ESCHERICHIA COLI  SARS Coronavirus 2 by RT PCR (hospital order, performed in [facility] hospital lab) Nasopharyngeal Nasopharyngeal Swab     Status: None   Collection Time: 05/17/20  7:49 AM   Specimen: Nasopharyngeal Swab  Result Value Ref Range Status   SARS Coronavirus 2 NEGATIVE NEGATIVE Final    Comment: (NOTE) SARS-CoV-2 target nucleic acids are NOT DETECTED. The SARS-CoV-2 RNA is generally detectable in upper and lower respiratory specimens during the acute phase of infection. The lowest concentration of SARS-CoV-2 viral copies this assay can detect is 250 copies / mL. A negative result does not preclude SARS-CoV-2 infection and should not be used as the sole basis for treatment or other patient management decisions.  A negative result may occur with improper specimen collection / handling, submission of specimen other than nasopharyngeal swab, presence of viral mutation(s) within the areas targeted by this assay, and inadequate number of viral copies (<250 copies / mL). A negative result must be combined with clinical observations, patient history, and epidemiological information. Fact Sheet for Patients:   StrictlyIdeas.no Fact Sheet for Healthcare Providers: BankingDealers.co.za This test is not yet approved or cleared  by the [place] FDA and has been authorized for detection and/or diagnosis of SARS-CoV-2 by FDA under an Emergency Use Authorization (EUA).  This EUA will remain in effect (meaning this test can be used) for the  duration of the COVID-19 declaration under Section 564(b)(1) of the Act, 21 U.S.C. section 360bbb-3(b)(1), unless the authorization is terminated or revoked sooner. Performed at [facility], [place] [address] [place], [place] [address]      Studies: No results found.    Flora Lipps, MD  [facility] 05/19/2020       Attending physician's note   I have  taken an interval history, reviewed the chart and examined the patient. I agree with the Advanced Practitioner's note, impression and recommendations.   S/p cholecystectomy with cholelithiasis and chronic cholecystitis  Feels much better.  No longer has nausea or vomiting. Has some discomfort at surgical site otherwise pain is also improved   Follow-up in GI office as scheduled  GI will sign off, available if have any questions   K. Denzil Magnuson , MD [phone]

## 2020-05-19 NOTE — Progress Notes (Signed)
Antibiotic stewardship note  Morgan [name] grew out [employer] from a urine culture and is on ceftriaxone.  The organism is resistant to ceftriaxone, the patient is afebrile and is without urinary symptoms.  Asked Dr. Louanne Belton about the possibility of monitoring off antibiotics as this seems to be asymptomatic bacteriuria.  Orders received to stop ceftriaxone.  Heide Guile, PharmD, BCPS-AQ ID Clinical Pharmacist

## 2020-05-19 NOTE — Progress Notes (Signed)
[facility] Progress Note  1 Day Post-Op  Subjective: Patient sore but feeling much better than before surgery. No nausea this AM. No flatus but has not been up walking yet.   Objective: Vital signs in last 24 hours: Temp:  [97.9 F (36.6 C)-98.7 F (37.1 C)] 97.9 F (36.6 C) (06/03 0548) Pulse Rate:  [67-76] 76 (06/03 0548) Resp:  [11-24] 17 (06/03 0548) BP: (122-171)/(75-90) 171/88 (06/03 0548) SpO2:  [94 %-100 %] 99 % (06/03 0548) Last BM Date: 05/15/20  Intake/Output from previous day: 06/02 0701 - 06/03 0700 In: 2063 [P.O.:355; I.V.:1557.9; IV Piggyback:150.1] Out: 10 [Blood:10] Intake/Output this shift: No intake/output data recorded.  PE: Abd: soft, appropriately ttp, ND, BS hypoactive, incisions c/d/i    Lab Results:  Recent Labs    05/18/20 0553 05/19/20 0544  WBC 7.5 7.9  HGB 9.4* 9.6*  HCT 32.0* 32.3*  PLT 238 230   BMET Recent Labs    05/18/20 0553 05/19/20 0544  NA 140 135  K 3.5 4.3  CL 103 101  CO2 29 25  GLUCOSE 112* 100*  BUN 17 22  CREATININE 0.76 0.85  CALCIUM 8.2* 8.2*   PT/INR No results for input(s): LABPROT, INR in the last 72 hours. CMP     Component Value Date/Time   NA 135 05/19/2020 0544   NA 143 12/26/2018 0000   NA 139 09/18/2012 2000   K 4.3 05/19/2020 0544   K 3.3 (L) 09/18/2012 2000   CL 101 05/19/2020 0544   CL 99 09/18/2012 2000   CO2 25 05/19/2020 0544   CO2 33 (H) 09/18/2012 2000   GLUCOSE 100 (H) 05/19/2020 0544   GLUCOSE 111 (H) 09/18/2012 2000   BUN 22 05/19/2020 0544   BUN 16 12/26/2018 0000   BUN 13 09/18/2012 2000   CREATININE 0.85 05/19/2020 0544   CREATININE 0.72 09/18/2012 2000   CALCIUM 8.2 (L) 05/19/2020 0544   CALCIUM 9.4 09/18/2012 2000   PROT 5.9 (L) 05/19/2020 0544   PROT 7.3 09/18/2012 2000   ALBUMIN 3.0 (L) 05/19/2020 0544   ALBUMIN 2.8 (L) 09/18/2012 2000   AST 25 05/19/2020 0544   AST 19 09/18/2012 2000   ALT 16 05/19/2020 0544   ALT 10 (L) 09/18/2012 2000    ALKPHOS 87 05/19/2020 0544   ALKPHOS 163 (H) 09/18/2012 2000   BILITOT 0.5 05/19/2020 0544   BILITOT 0.5 09/18/2012 2000   GFRNONAA >60 05/19/2020 0544   GFRNONAA >60 09/18/2012 2000   GFRAA >60 05/19/2020 0544   GFRAA >60 09/18/2012 2000   Lipase     Component Value Date/Time   LIPASE 23 05/16/2020 1442       Studies/Results: NM Hepatobiliary Liver Func  Result Date: 05/17/2020 CLINICAL DATA:  Right upper quadrant pain, cholelithiasis EXAM: NUCLEAR MEDICINE HEPATOBILIARY IMAGING TECHNIQUE: Sequential images of the abdomen were obtained out to 60 minutes following intravenous administration of radiopharmaceutical. RADIOPHARMACEUTICALS:  4.65 mCi Tc-44m  Choletec IV COMPARISON:  CT abdomen pelvis, 05/16/2020 FINDINGS: Prompt uptake and biliary excretion of activity by the liver is seen. Gallbladder activity is visualized at 10 minutes, consistent with patency of cystic duct. Biliary activity passes into small bowel at 35 minutes, consistent with patent common bile duct. IMPRESSION: The cystic and common bile ducts are patent. Electronically Signed   By: Eddie Candle M.D.   On: 05/17/2020 12:38    Anti-infectives: Anti-infectives (From admission, onward)   Start     Dose/Rate Route Frequency Ordered Stop   05/17/20 0530  cefTRIAXone (ROCEPHIN) 1 g in sodium chloride 0.9 % 100 mL IVPB     1 g 200 mL/hr over 30 Minutes Intravenous Daily 05/17/20 0516         Assessment/Plan CHF HTN COPD CKD stage III Chronic constipation GERD and dysphagia - UGI per GI  Hx of benign breast tumor  Acute on Chronic cholecystitis S/p laparoscopic cholecystectomy 05/18/20 Dr. Hassell Done - POD#1 - needs to mobilize - FLD and ADAT  - ok to shower - no further abx needed from a gallbladder standpoint - follow up in AVS already, ok to discharge from a surgery standpoint once tolerating PO without nausea and medically cleared  FEN: FLD VTE: SCDs, SQ heparin  ID: rocephin 6/1>>  LOS: 2 days     Norm Parcel , [facility] 05/19/2020, 10:16 AM Please see Amion for pager number during day hours 7:00am-4:30pm

## 2020-05-19 NOTE — Progress Notes (Signed)
Progress Note  CC:     Abdominal pain, nausea / vomiting     ASSESSMENT AND PLAN:   69 yo female with pmh significant for HTN, CKD3, COPD, appendectomy, hysterectomy, cholecystectomy  # Right sided abdominal pain / nausea and vomiting/ cholelithiasis ( on initial [place] but not on repeat).  --HIDA negative.  --Plan was for EGD today but cancelled after surgical evaluated and took to OR out of concern for acute cholecystitis. She is now s/p lap cholecystectomy with IOC. Found to have chronic cholecystitis. I couldn't find IOC report. LFTs have been normal.   # Pyloric stenosis, s/p balloon dilation on 04/21/20  --Was supposed to get follow up EGD for further dilation but due to significant RUQ pain ended up being hospitalized --At time of outpatient follow up we will plan for scheduled repeat EGD to reassess pyloric stenosis and also evaluate IDA.  --Follow up with me on 06/01/20 at 1:30 pm   # Iron deficiency anemia --outpatient colonoscopy to be scheduled, along with EGD. Marland KitchenProcedures need to be done at hospital due to 02 requirements    SUBJECTIVE    Having post-op pain. Getting Toradol Q 8. Last Dilaudid dose was around 6am.    OBJECTIVE:     Vital signs in last 24 hours: Temp:  [97.9 F (36.6 C)-98.7 F (37.1 C)] 97.9 F (36.6 C) (06/03 0548) Pulse Rate:  [67-76] 76 (06/03 0548) Resp:  [11-24] 17 (06/03 0548) BP: (122-171)/(75-90) 171/88 (06/03 0548) SpO2:  [94 %-100 %] 99 % (06/03 0548) Last BM Date: 05/15/20 General:   Alert, mild wincing with pain Heart:  Regular rate and rhythm.  No lower extremity edema   Pulm: Normal respiratory effort   Abdomen:  Soft,  nontender, nondistended.  Normal bowel sounds.          Neurologic:  Alert and  oriented,  grossly normal neurologically. Psych:  Pleasant, cooperative.  Normal mood and affect.   Intake/Output from previous day: 06/02 0701 - 06/03 0700 In: 2063 [P.O.:355; I.V.:1557.9; IV Piggyback:150.1] Out: 10  [Blood:10] Intake/Output this shift: No intake/output data recorded.  Lab Results: Recent Labs    05/17/20 0513 05/18/20 0553 05/19/20 0544  WBC 9.2 7.5 7.9  HGB 10.2* 9.4* 9.6*  HCT 34.8* 32.0* 32.3*  PLT 270 238 230   BMET Recent Labs    05/17/20 0513 05/18/20 0553 05/19/20 0544  NA 143 140 135  K 3.8 3.5 4.3  CL 105 103 101  CO2 31 29 25   GLUCOSE 123* 112* 100*  BUN 20 17 22   CREATININE 0.96 0.76 0.85  CALCIUM 8.3* 8.2* 8.2*   LFT Recent Labs    05/17/20 0513 05/18/20 0553 05/19/20 0544  PROT 6.7   < > 5.9*  ALBUMIN 3.5   < > 3.0*  AST 14*   < > 25  ALT 13   < > 16  ALKPHOS 103   < > 87  BILITOT 0.5   < > 0.5  BILIDIR <0.1  --   --   IBILI NOT CALCULATED  --   --    < > = values in this interval not displayed.   PT/INR No results for input(s): LABPROT, INR in the last 72 hours. Hepatitis Panel No results for input(s): HEPBSAG, HCVAB, HEPAIGM, HEPBIGM in the last 72 hours.  NM Hepatobiliary Liver Func  Result Date: 05/17/2020 CLINICAL DATA:  Right upper quadrant pain, cholelithiasis EXAM: NUCLEAR MEDICINE HEPATOBILIARY IMAGING TECHNIQUE: Sequential images of the abdomen  were obtained out to 60 minutes following intravenous administration of radiopharmaceutical. RADIOPHARMACEUTICALS:  4.65 mCi Tc-29m  Choletec IV COMPARISON:  CT abdomen pelvis, 05/16/2020 FINDINGS: Prompt uptake and biliary excretion of activity by the liver is seen. Gallbladder activity is visualized at 10 minutes, consistent with patency of cystic duct. Biliary activity passes into small bowel at 35 minutes, consistent with patent common bile duct. IMPRESSION: The cystic and common bile ducts are patent. Electronically Signed   By: Eddie Candle M.D.   On: 05/17/2020 12:38      Principal Problem:   Abdominal pain Active Problems:   Essential hypertension   CKD (chronic kidney disease) stage 3, GFR 30-59 ml/min     LOS: 2 days   Tye Savoy ,NP 05/19/2020, 9:25 AM

## 2020-05-19 NOTE — Progress Notes (Signed)
Attempted to get patient out of bed this shift several times.  Patient refused, patient educated.

## 2020-05-19 NOTE — Plan of Care (Signed)

## 2020-05-20 DIAGNOSIS — Z9981 Dependence on supplemental oxygen: Secondary | ICD-10-CM

## 2020-05-20 DIAGNOSIS — F329 Major depressive disorder, single episode, unspecified: Secondary | ICD-10-CM

## 2020-05-20 DIAGNOSIS — F419 Anxiety disorder, unspecified: Secondary | ICD-10-CM

## 2020-05-20 DIAGNOSIS — K219 Gastro-esophageal reflux disease without esophagitis: Secondary | ICD-10-CM

## 2020-05-20 LAB — GLUCOSE, CAPILLARY: Glucose-Capillary: 86 mg/dL (ref 70–99)

## 2020-05-20 LAB — CBC
HCT: 31.2 % — ABNORMAL LOW (ref 36.0–46.0)
Hemoglobin: 9.3 g/dL — ABNORMAL LOW (ref 12.0–15.0)
MCH: 24.9 pg — ABNORMAL LOW (ref 26.0–34.0)
MCHC: 29.8 g/dL — ABNORMAL LOW (ref 30.0–36.0)
MCV: 83.4 fL (ref 80.0–100.0)
Platelets: 199 10*3/uL (ref 150–400)
RBC: 3.74 MIL/uL — ABNORMAL LOW (ref 3.87–5.11)
RDW: 15.8 % — ABNORMAL HIGH (ref 11.5–15.5)
WBC: 6.6 10*3/uL (ref 4.0–10.5)
nRBC: 0 % (ref 0.0–0.2)

## 2020-05-20 LAB — COMPREHENSIVE METABOLIC PANEL
ALT: 17 U/L (ref 0–44)
AST: 19 U/L (ref 15–41)
Albumin: 2.8 g/dL — ABNORMAL LOW (ref 3.5–5.0)
Alkaline Phosphatase: 80 U/L (ref 38–126)
Anion gap: 11 (ref 5–15)
BUN: 26 mg/dL — ABNORMAL HIGH (ref 8–23)
CO2: 29 mmol/L (ref 22–32)
Calcium: 8.5 mg/dL — ABNORMAL LOW (ref 8.9–10.3)
Chloride: 104 mmol/L (ref 98–111)
Creatinine, Ser: 0.89 mg/dL (ref 0.44–1.00)
GFR calc Af Amer: >60 mL/min (ref >60)
GFR calc non Af Amer: >60 mL/min (ref >60)
Glucose, Bld: 86 mg/dL (ref 70–99)
Potassium: 3.5 mmol/L (ref 3.5–5.1)
Sodium: 144 mmol/L (ref 135–145)
Total Bilirubin: 0.5 mg/dL (ref 0.3–1.2)
Total Protein: 5.7 g/dL — ABNORMAL LOW (ref 6.5–8.1)

## 2020-05-20 LAB — MAGNESIUM: Magnesium: 2.1 mg/dL (ref 1.7–2.4)

## 2020-05-20 MED ORDER — OXYCODONE HCL 5 MG PO TABS: 5.0000 mg | ORAL_TABLET | Freq: Four times a day (QID) | ORAL | 0 refills | Status: DC | PRN

## 2020-05-20 NOTE — Plan of Care (Signed)
  Problem: Clinical Measurements: Goal: Will remain free from infection Outcome: Progressing   Problem: Clinical Measurements: Goal: Diagnostic test results will improve Outcome: Progressing   Problem: Clinical Measurements: Goal: Respiratory complications will improve Outcome: Progressing   Problem: Clinical Measurements: Goal: Cardiovascular complication will be avoided Outcome: Progressing   

## 2020-05-20 NOTE — Discharge Summary (Signed)
Physician Discharge Summary  Morgan [name] EUM:[id number] DOB: 14-Oct-1951 DOA: 05/16/2020  PCP: [name], MD  Admit date: 05/16/2020 Discharge date: 05/20/2020  Admitted From: Home  Discharge disposition: Home  Recommendations for Outpatient Follow-Up:   . Follow up with your primary care provider in one week.  . Check CBC, BMP,magnesium in the next visit . Follow-up with GI and general surgery as has been scheduled.  Discharge Diagnosis:   Principal Problem:   Chronic cholecystitis Active Problems:   Essential hypertension   Depression   Anxiety   GERD (gastroesophageal reflux disease)   CKD (chronic kidney disease) stage 3, GFR 30-59 ml/min   Abdominal pain   Oxygen dependent  Discharge Condition: Improved.  Diet recommendation: Low sodium, heart healthy.    Code status: Full.   History of Present Illness:   Morgan [name] a 69 y.o.femalewithhistory of chronic abdominal pain, essential hypertension who follows up with [facility] had recent EGD done on Apr 21, 2020 which showed benign-appearing pyloric stenosis for which patient underwent dilatation and had subsequently followed up with gastroenterologist. She presented to the ER because of worsening abdominal pain for few days with vomiting.In the ER, patient had CT abdomen and pelvis and sonogram of the abdomen which both showed cholelithiasis with no definite evidence of cholecystitis. On exam, patient had right upper quadrant tenderness. LFTs were normal with normal WBC count and patient afebrile. Urinalysis showed features concerning for UTI. ER physician discussed with on-call gastroenterologist who advised HIDA scan.  Patient was then admitted to the hospital for further evaluation and treatment.   Hospital Course:   Following conditions were addressed during hospitalization as listed below,  Right upper quadrant pain likely secondary to acute on chronic cholecystitis. CT scan of the abdomen showed  cholelithiasis. HIDA scan showed patency of cystic duct.  GI was consulted due to persistent symptoms.  Subsequently, general surgery was also consulted for symptomatic cholelithiasis.  Patient underwent laparoscopic cholecystectomy on 05/18/2020 which showed findings of acute on chronic cholecystitis.    Patient was gradually advanced on the diet.  At this time, general surgery has seen the patient and have cleared the patient for discharge home.  Chronic kidney disease stage IIIa.  Latest creatinine of 0.8.  Will need outpatient follow-up with BMP  History of COPD with chronic respiratory failure on 2 L of nasal cannula at baseline.    Will continue oxygen on discharge.  History of GERD.  Dysphagia.    History of pyloric stenosis status post balloon dilation on 04/21/2020 by [facility].  Seen by GI this admission.  Continue PPI, sucralfate on discharge. Will need outpatient follow-up with GI after discharge.  Plan is to do colonoscopy and endoscopy as outpatient.  Patient is supposed to get EGD for further dilatation.  Patient does have an appointment to follow-up with GI on 06/01/2020 at 1:30 PM  Idiopathic constipation.  On Linzess.  Continue on discharge  Iron deficiency anemia.  Hemoglobin of 9.6.  Resume iron supplement on discharge.  Patient will receive a colonoscopy as outpatient with GI.  Essential hypertension.  Continue Coreg.  History of anxiety depression.  Will resume home medication with gabapentin, Prozac, Atarax, Neurontin  Abnormal urinalysis, bacteriuria.  Asymptomatic bacteria.  Urine culture showed ESBL E. coli.  Patient did not have urinary symptoms and is afebrile.  Was on IV Rocephin empirically.  Will discontinue.  Disposition.  At this time, patient is stable for disposition home.  Patient will need to  follow-up with GI and general surgery as outpatient.  Medical Consultants:    GI  General surgery  Procedures:     HIDA scan  Laparoscopic cholecystectomy  with intraoperative cholangiogram on 05/18/2020 by Dr. Hassell Done general surgery. Subjective:   Today, patient feels okay has mild epigastric discomfort and nausea.  Seen by general surgery today.  Discharge Exam:   Vitals:   05/20/20 0555 05/20/20 0814  BP: (!) 152/83 (!) 156/80  Pulse: 64   Resp: 18   Temp: 98.4 F (36.9 C)   SpO2: 99%    Vitals:   05/19/20 1414 05/19/20 2059 05/20/20 0555 05/20/20 0814  BP: (!) 153/86 138/74 (!) 152/83 (!) 156/80  Pulse: 69 64 64   Resp: 18 18 18    Temp: 98.2 F (36.8 C) 98.6 F (37 C) 98.4 F (36.9 C)   TempSrc: Oral Oral Oral   SpO2: 100% 97% 99%   Weight:      Height:       General: Alert awake, not in obvious distress, obese HENT: pupils equally reacting to light,  No scleral pallor or icterus noted. Oral mucosa is moist.  Chest: Diminished breath sounds bilaterally.  CVS: S1 &S2 heard. No murmur.  Regular rate and rhythm. Abdomen: Soft, moderately tender over the abdomen status post laparoscopic cholecystectomy,, nondistended.  Bowel sounds are heard.   Extremities: No cyanosis, clubbing or edema.  Peripheral pulses are palpable. Psych: Alert, awake and oriented, normal mood CNS:  No cranial nerve deficits.  Power equal in all extremities.   Skin: Warm and dry.  No rashes noted.  The results of significant diagnostics from this hospitalization (including imaging, microbiology, ancillary and laboratory) are listed below for reference.     Diagnostic Studies:   NM Hepatobiliary Liver Func  Result Date: 05/17/2020 CLINICAL DATA:  Right upper quadrant pain, cholelithiasis EXAM: NUCLEAR MEDICINE HEPATOBILIARY IMAGING TECHNIQUE: Sequential images of the abdomen were obtained out to 60 minutes following intravenous administration of radiopharmaceutical. RADIOPHARMACEUTICALS:  4.65 mCi Tc-36m Choletec IV COMPARISON:  CT abdomen pelvis, 05/16/2020 FINDINGS: Prompt uptake and biliary excretion of activity by the liver is seen. Gallbladder  activity is visualized at 10 minutes, consistent with patency of cystic duct. Biliary activity passes into small bowel at 35 minutes, consistent with patent common bile duct. IMPRESSION: The cystic and common bile ducts are patent. Electronically Signed   By: AEddie CandleM.D.   On: 05/17/2020 12:38   CT ABDOMEN PELVIS W CONTRAST  Result Date: 05/16/2020 CLINICAL DATA:  69year old female with right upper quadrant abdominal pain. EXAM: CT ABDOMEN AND PELVIS WITH CONTRAST TECHNIQUE: Multidetector CT imaging of the abdomen and pelvis was performed using the standard protocol following bolus administration of intravenous contrast. CONTRAST:  1077mOMNIPAQUE IOHEXOL 300 MG/ML  SOLN COMPARISON:  CT abdomen pelvis dated 03/28/2020. FINDINGS: Lower chest: The visualized lung bases are clear. No intra-abdominal free air or free fluid. Hepatobiliary: The liver is unremarkable. No intrahepatic biliary ductal dilatation. There is a gallstone. No pericholecystic fluid or evidence of acute cholecystitis. Pancreas: Unremarkable. No pancreatic ductal dilatation or surrounding inflammatory changes. Spleen: Normal in size without focal abnormality. Adrenals/Urinary Tract: The adrenal glands are unremarkable. There is no hydronephrosis on either side. There is symmetric enhancement and excretion of contrast by both kidneys. The visualized ureters and urinary bladder appear unremarkable. Stomach/Bowel: There is sigmoid diverticulosis and several scattered colonic diverticula without active inflammatory changes. There is no bowel obstruction or active inflammation. Appendectomy. Vascular/Lymphatic: Mild aortoiliac atherosclerotic disease.  The IVC is unremarkable. No portal venous gas. There is no adenopathy. Reproductive: Hysterectomy. Other: None Musculoskeletal: No acute osseous pathology. IMPRESSION: 1. No acute intra-abdominal or pelvic pathology. 2. Cholelithiasis. 3. Colonic diverticulosis. 4. Aortic Atherosclerosis  (ICD10-I70.0). Electronically Signed   By: Anner Crete M.D.   On: 05/16/2020 18:46   US Abdomen Limited RUQ  Result Date: 05/16/2020 CLINICAL DATA:  Right upper quadrant pain EXAM: ULTRASOUND ABDOMEN LIMITED RIGHT UPPER QUADRANT COMPARISON:  05/06/2020 FINDINGS: Gallbladder: No gallstones. Top normal gallbladder wall thickness. No sonographic Murphy sign noted by sonographer. Common bile duct: Diameter: 3.9 mm Liver: No focal lesion identified. Increased hepatic parenchymal echogenicity. Portal vein is patent on color Doppler imaging with normal direction of blood flow towards the liver. Other: None. IMPRESSION: 1. No cholelithiasis or sonographic evidence of acute cholecystitis. Previously demonstrated 10 mm gallstone is not visualized on the current examination and may reflect recent passage of the stone. 2. Increased hepatic echogenicity as can be seen with hepatic steatosis. Electronically Signed   By: Kathreen Devoid   On: 05/16/2020 16:37     Labs:   Basic Metabolic Panel: Recent Labs  Lab 05/16/20 1442 05/16/20 1442 05/17/20 5035 05/17/20 4656 05/18/20 0553 05/18/20 0553 05/19/20 0544 05/20/20 0522  NA 141  --  143  --  140  --  135 144  K 4.1   < > 3.8   < > 3.5   < > 4.3 3.5  CL 103  --  105  --  103  --  101 104  CO2 29  --  31  --  29  --  25 29  GLUCOSE 114*  --  123*  --  112*  --  100* 86  BUN 22  --  20  --  17  --  22 26*  CREATININE 0.95  --  0.96  --  0.76  --  0.85 0.89  CALCIUM 8.6*  --  8.3*  --  8.2*  --  8.2* 8.5*  MG  --   --   --   --  2.1  --   --  2.1  PHOS  --   --   --   --  3.3  --   --   --    < > = values in this interval not displayed.   GFR Estimated Creatinine Clearance: 87.6 mL/min (by C-G formula based on SCr of 0.89 mg/dL). Liver Function Tests: Recent Labs  Lab 05/16/20 1442 05/17/20 0513 05/18/20 0553 05/19/20 0544 05/20/20 0522  AST 16 14* 10* 25 19  ALT 13 13 11 16 17   ALKPHOS 109 103 92 87 80  BILITOT 0.5 0.5 0.4 0.5 0.5    PROT 6.8 6.7 5.9* 5.9* 5.7*  ALBUMIN 3.4* 3.5 3.0* 3.0* 2.8*   Recent Labs  Lab 05/16/20 1442  LIPASE 23   No results for input(s): AMMONIA in the last 168 hours. Coagulation profile No results for input(s): INR, PROTIME in the last 168 hours.  CBC: Recent Labs  Lab 05/16/20 1442 05/17/20 0513 05/18/20 0553 05/19/20 0544 05/20/20 0522  WBC 10.2 9.2 7.5 7.9 6.6  HGB 10.9* 10.2* 9.4* 9.6* 9.3*  HCT 35.7* 34.8* 32.0* 32.3* 31.2*  MCV 82.4 85.1 85.1 83.5 83.4  PLT 296 270 238 230 199   Cardiac Enzymes: No results for input(s): CKTOTAL, CKMB, CKMBINDEX, TROPONINI in the last 168 hours. BNP: Invalid input(s): POCBNP CBG: Recent Labs  Lab 05/18/20 2351 05/19/20 0751 05/19/20 1617 05/19/20  2324 05/20/20 0814  GLUCAP 121* 95 92 91 86   D-Dimer No results for input(s): DDIMER in the last 72 hours. Hgb A1c No results for input(s): HGBA1C in the last 72 hours. Lipid Profile No results for input(s): CHOL, HDL, LDLCALC, TRIG, CHOLHDL, LDLDIRECT in the last 72 hours. Thyroid function studies No results for input(s): TSH, T4TOTAL, T3FREE, THYROIDAB in the last 72 hours.  Invalid input(s): FREET3 Anemia work up No results for input(s): VITAMINB12, FOLATE, FERRITIN, TIBC, IRON, RETICCTPCT in the last 72 hours. Microbiology Recent Results (from the past 240 hour(s))  Urine culture     Status: Abnormal   Collection Time: 05/16/20  9:00 PM   Specimen: Urine, Clean Catch  Result Value Ref Range Status   Specimen Description   Final    URINE, CLEAN CATCH Performed at [facility], [place] [address] [place], [place] [address]    Special Requests   Final    NONE Performed at [facility], [place] [address], [place], [place] [address]    Culture (A)  Final    >=100,000 COLONIES/mL ESCHERICHIA COLI Confirmed Extended Spectrum Beta-Lactamase Producer (ESBL).  In bloodstream infections from ESBL organisms, carbapenems are preferred over  piperacillin/tazobactam. They are shown to have a lower risk of mortality.    Report Status 05/19/2020 FINAL  Final   Organism ID, Bacteria ESCHERICHIA COLI (A)  Final      Susceptibility   Escherichia coli - MIC*    AMPICILLIN >=32 RESISTANT Resistant     CEFAZOLIN >=64 RESISTANT Resistant     CEFTRIAXONE >=64 RESISTANT Resistant     CIPROFLOXACIN >=4 RESISTANT Resistant     GENTAMICIN <=1 SENSITIVE Sensitive     IMIPENEM <=0.25 SENSITIVE Sensitive     NITROFURANTOIN <=16 SENSITIVE Sensitive     TRIMETH/SULFA <=20 SENSITIVE Sensitive     AMPICILLIN/SULBACTAM 4 SENSITIVE Sensitive     PIP/TAZO <=4 SENSITIVE Sensitive     * >=100,000 COLONIES/mL ESCHERICHIA COLI  SARS Coronavirus 2 by RT PCR (hospital order, performed in [place] hospital lab) Nasopharyngeal Nasopharyngeal Swab     Status: None   Collection Time: 05/17/20  7:49 AM   Specimen: Nasopharyngeal Swab  Result Value Ref Range Status   SARS Coronavirus 2 NEGATIVE NEGATIVE Final    Comment: (NOTE) SARS-CoV-2 target nucleic acids are NOT DETECTED. The SARS-CoV-2 RNA is generally detectable in upper and lower respiratory specimens during the acute phase of infection. The lowest concentration of SARS-CoV-2 viral copies this assay can detect is 250 copies / mL. A negative result does not preclude SARS-CoV-2 infection and should not be used as the sole basis for treatment or other patient management decisions.  A negative result may occur with improper specimen collection / handling, submission of specimen other than nasopharyngeal swab, presence of viral mutation(s) within the areas targeted by this assay, and inadequate number of viral copies (<250 copies / mL). A negative result must be combined with clinical observations, patient history, and epidemiological information. Fact Sheet for Patients:   StrictlyIdeas.no Fact Sheet for Healthcare  Providers: BankingDealers.co.za This test is not yet approved or cleared  by the [place] FDA and has been authorized for detection and/or diagnosis of SARS-CoV-2 by FDA under an Emergency Use Authorization (EUA).  This EUA will remain in effect (meaning this test can be used) for the duration of the COVID-19 declaration under Section 564(b)(1) of the Act, 21 U.S.C. section 360bbb-3(b)(1), unless the authorization is terminated or revoked sooner. Performed  at [facility], [place] [address], [place], [place] [address]      Discharge Instructions:   Discharge Instructions    Call MD for:   Complete by: As directed    Worsening symptoms   Diet - low sodium heart healthy   Complete by: As directed    Discharge instructions   Complete by: As directed    Follow-up with general surgery as scheduled by the clinic.  Follow-up with GI clinic as has been scheduled.  Follow-up with your primary care physician in 1 to 2 weeks. Continue long deep breaths, oxygen at home. If you experience worsening symptoms, please seek medical attention.   Discharge wound care:   Complete by: As directed    As per surgery   Increase activity slowly   Complete by: As directed      Allergies as of 05/20/2020      Reactions   Demerol [meperidine] Hives   Strawberry (diagnostic) Hives   Wasp Venom Hives      Medication List    STOP taking these medications   Dexilant 60 MG capsule Generic drug: dexlansoprazole   Suprep Bowel Prep Kit 17.5-3.13-1.6 GM/177ML Soln Generic drug: Na Sulfate-K Sulfate-Mg Sulf     TAKE these medications   albuterol 108 (90 Base) MCG/ACT inhaler Commonly known as: VENTOLIN HFA TAKE 2 PUFFS BY MOUTH EVERY 6 HOURS AS NEEDED FOR WHEEZE OR SHORTNESS OF BREATH What changed: See the new instructions.   aspirin 81 MG EC tablet Take 81 mg by mouth daily. daily   aspirin-acetaminophen-caffeine 250-250-65 MG tablet Commonly known as:  EXCEDRIN MIGRAINE Take 2 tablets by mouth every 6 (six) hours as needed for headache.   atorvastatin 40 MG tablet Commonly known as: LIPITOR Take 40 mg by mouth daily.   carvedilol 3.125 MG tablet Commonly known as: COREG Take 3.125 mg by mouth in the morning and at bedtime.   colestipol 1 g tablet Commonly known as: COLESTID TAKE 1 TABLET (1 G TOTAL) BY MOUTH 2 (TWO) TIMES DAILY.   esomeprazole 40 MG capsule Commonly known as: NEXIUM Take 1 capsule by mouth twice daily. What changed:   how much to take  how to take this  when to take this  additional instructions   FLUoxetine 40 MG capsule Commonly known as: PROZAC Take 40 mg by mouth daily.   furosemide 40 MG tablet Commonly known as: LASIX HOLD UNTIL seen by your doctor What changed:   how much to take  how to take this  when to take this  additional instructions   gabapentin 400 MG capsule Commonly known as: NEURONTIN Take 400 mg by mouth 3 (three) times daily.   hydrOXYzine 25 MG tablet Commonly known as: ATARAX/VISTARIL Take 12.5-25 mg by mouth 3 (three) times daily as needed for anxiety.   Iron 325 (65 Fe) MG Tabs Take 1 tablet (325 mg total) by mouth daily.   linaclotide 145 MCG Caps capsule Commonly known as: Linzess Take 1 capsule (145 mcg total) by mouth daily before breakfast.   mirtazapine 45 MG tablet Commonly known as: REMERON Take 45 mg by mouth at bedtime.   oxyCODONE 5 MG immediate release tablet Commonly known as: Oxy IR/ROXICODONE Take 1-2 tablets (5-10 mg total) by mouth every 6 (six) hours as needed for moderate pain, severe pain or breakthrough pain.   OXYGEN Inhale into the lungs. 02 at bedtime   promethazine 25 MG tablet Commonly known as: PHENERGAN Take 1 tablet (25 mg total)  by mouth daily as needed for nausea or vomiting.   sucralfate 1 g tablet Commonly known as: Carafate Take 1 tablet (1 g total) by mouth 4 (four) times daily -  with meals and at bedtime.             Discharge Care Instructions  (From admission, onward)         Start     Ordered   05/20/20 0000  Discharge wound care:    Comments: As per surgery   05/20/20 1229         Follow-up Information    [facility] Follow up on 06/07/2020.   Specialty: General Surgery Why: Your appointment is at 10:30AM.  Be at the office 30 minutes early for check in.  Bring photo ID and insurance information.   Contact information: [address] [place] [address] [phone]        Willia Craze, NP Follow up on 06/01/2020.   Specialty: Gastroenterology Why: at 1:30 pm Contact information: [place] [id number] [phone]            Time coordinating discharge: 39 minutes  Signed:  Charniece Venturino  [facility] 05/20/2020, 12:30 PM

## 2020-05-20 NOTE — Care Management Important Message (Signed)
Important Message  Patient Details  Name: Morgan [name] MRN: [id number] Date of Birth: 06/19/1951   Medicare Important Message Given:     Document has been given to [place], NCM  to give to the patient.   Crista Luria 05/20/2020, 11:19 AM

## 2020-05-20 NOTE — Progress Notes (Signed)
[facility] Progress Note  2 Days Post-Op  Subjective: Patient with some RUQ soreness. Some nausea but no vomiting. Tolerating FLD.+flatus.   Objective: Vital signs in last 24 hours: Temp:  [98.2 F (36.8 C)-98.6 F (37 C)] 98.4 F (36.9 C) (06/04 0555) Pulse Rate:  [64-69] 64 (06/04 0555) Resp:  [18] 18 (06/04 0555) BP: (138-156)/(74-86) 156/80 (06/04 0814) SpO2:  [97 %-100 %] 99 % (06/04 0555) Last BM Date: 05/15/20  Intake/Output from previous day: 06/03 0701 - 06/04 0700 In: 0  Out: 200 [Urine:200] Intake/Output this shift: No intake/output data recorded.  PE: Abd: soft, appropriately ttp, ND, BS hypoactive, incisions c/d/i   Lab Results:  Recent Labs    05/19/20 0544 05/20/20 0522  WBC 7.9 6.6  HGB 9.6* 9.3*  HCT 32.3* 31.2*  PLT 230 199   BMET Recent Labs    05/19/20 0544 05/20/20 0522  NA 135 144  K 4.3 3.5  CL 101 104  CO2 25 29  GLUCOSE 100* 86  BUN 22 26*  CREATININE 0.85 0.89  CALCIUM 8.2* 8.5*   PT/INR No results for input(s): LABPROT, INR in the last 72 hours. CMP     Component Value Date/Time   NA 144 05/20/2020 0522   NA 143 12/26/2018 0000   NA 139 09/18/2012 2000   K 3.5 05/20/2020 0522   K 3.3 (L) 09/18/2012 2000   CL 104 05/20/2020 0522   CL 99 09/18/2012 2000   CO2 29 05/20/2020 0522   CO2 33 (H) 09/18/2012 2000   GLUCOSE 86 05/20/2020 0522   GLUCOSE 111 (H) 09/18/2012 2000   BUN 26 (H) 05/20/2020 0522   BUN 16 12/26/2018 0000   BUN 13 09/18/2012 2000   CREATININE 0.89 05/20/2020 0522   CREATININE 0.72 09/18/2012 2000   CALCIUM 8.5 (L) 05/20/2020 0522   CALCIUM 9.4 09/18/2012 2000   PROT 5.7 (L) 05/20/2020 0522   PROT 7.3 09/18/2012 2000   ALBUMIN 2.8 (L) 05/20/2020 0522   ALBUMIN 2.8 (L) 09/18/2012 2000   AST 19 05/20/2020 0522   AST 19 09/18/2012 2000   ALT 17 05/20/2020 0522   ALT 10 (L) 09/18/2012 2000   ALKPHOS 80 05/20/2020 0522   ALKPHOS 163 (H) 09/18/2012 2000   BILITOT 0.5 05/20/2020 0522    BILITOT 0.5 09/18/2012 2000   GFRNONAA >60 05/20/2020 0522   GFRNONAA >60 09/18/2012 2000   GFRAA >60 05/20/2020 0522   GFRAA >60 09/18/2012 2000   Lipase     Component Value Date/Time   LIPASE 23 05/16/2020 1442       Studies/Results: No results found.  Anti-infectives: Anti-infectives (From admission, onward)   Start     Dose/Rate Route Frequency Ordered Stop   05/17/20 0530  cefTRIAXone (ROCEPHIN) 1 g in sodium chloride 0.9 % 100 mL IVPB  Status:  Discontinued     1 g 200 mL/hr over 30 Minutes Intravenous Daily 05/17/20 0516 05/19/20 1106       Assessment/Plan CHF HTN COPD CKDstage III Chronic constipation GERD and dysphagia - UGI per GI, can likely be done as an outpatient Hx of benign breast tumor  Acute on Chronic cholecystitis S/p laparoscopic cholecystectomy 05/18/20 Dr. Hassell Done - POD#2 - continue to mobilize - FLD and ADAT  - ok to shower - no further abx needed from a gallbladder standpoint - follow up in [facility] already, stable for discharge from a surgery standpoint   FEN: FLD VTE: SCDs, SQ heparin  ID: rocephin 6/1>>  LOS:  3 days    [facility] 05/20/2020, 9:28 AM Please see Amion for pager number during day hours 7:00am-4:30pm

## 2020-05-20 NOTE — Progress Notes (Signed)
Pt discharged to home. Discharge instructions and medication education provided to pt.

## 2020-05-23 ENCOUNTER — Other Ambulatory Visit (HOSPITAL_COMMUNITY): Payer: Medicare HMO

## 2020-05-23 ENCOUNTER — Telehealth: Payer: Self-pay | Admitting: Family Medicine

## 2020-05-23 NOTE — Telephone Encounter (Signed)
Patient notified of Dr. Verda Cumins recommendations and she verbalized understanding.

## 2020-05-23 NOTE — Telephone Encounter (Signed)
Patient called today to schedule appointment She stated she recently had surgery and was advised to follow up with her primary. Patient scheduled appointment on June 14th. She wanted to know if you could call in a medication for her to help with nausea before then   Please advise

## 2020-05-23 NOTE — Telephone Encounter (Signed)
She had a GI surgery and was seeing them for her nausea. I would recommend she reach out to their office as the surgery was related to ongoing nausea.   If their office will not prescribe, call back.   Last nausea medication prescribed by GI on 5/19

## 2020-05-26 ENCOUNTER — Telehealth: Payer: Self-pay | Admitting: Gastroenterology

## 2020-05-26 NOTE — Telephone Encounter (Signed)
Spoke with this very kind patient. She is 1 week s/p cholecystectomy. She has been off of the pain medication (oxycodone) for >48 hours and continues to be nauseated. She has not experienced vomiting. She has purchased and is taking OTC dramamine without relief.  Afebrile. Normal bowel movements. She is eating non-greasy, low-fat foods. Encouraged to call the surgeon's office and discuss because she is so recently had the surgery. Patient agrees to this. Phone number provided to her.

## 2020-05-30 ENCOUNTER — Ambulatory Visit: Payer: Medicare HMO | Admitting: Family Medicine

## 2020-06-01 ENCOUNTER — Ambulatory Visit: Payer: Medicare HMO | Admitting: Nurse Practitioner

## 2020-06-01 ENCOUNTER — Encounter: Payer: Self-pay | Admitting: Nurse Practitioner

## 2020-06-01 VITALS — BP 176/90 | HR 80 | Ht 70.5 in | Wt 246.0 lb

## 2020-06-01 DIAGNOSIS — K5904 Chronic idiopathic constipation: Secondary | ICD-10-CM | POA: Diagnosis not present

## 2020-06-01 DIAGNOSIS — K311 Adult hypertrophic pyloric stenosis: Secondary | ICD-10-CM | POA: Diagnosis not present

## 2020-06-01 DIAGNOSIS — D509 Iron deficiency anemia, unspecified: Secondary | ICD-10-CM

## 2020-06-01 NOTE — Progress Notes (Signed)
IMPRESSION and PLAN:    69 year old female with PMH significant for iron deficiency anemia, PUD/partial pyloric stenosis status post balloon dilation, hypertension, CHF, COPD, CKD, appendectomy, hysterectomy, cholecystectomy     # IDA --She is already scheduled for colonoscopy and EGD to be done at [facility] July 19 with Dr. Silverio Decamp.  I reviewed the risk and benefits of the procedures  # Acute on chronic constipation --No bowel movement in 2 weeks despite daily Ex-Lax and stool softeners -- MiraLAX purge ASAP.  Following that patient will start 1 capful of MiraLAX daily  # Pyloric stenosis, status post balloon dilation 04/21/2020 --Reevaluation, possible repeat dilation at time of upcoming EGD.  Of note she has not had any nausea, abdominal pain or GERD symptoms since cholecystectomy later this month  # Chronic cholecystitis --That is post laparoscopic cholecystectomy 05/20/2020.  Doing well postoperatively.  No further symptoms HPI:    Primary GI: Harl Bowie, MD   Chief complaint : Hospital follow-up  Patient is a 69 year old female who was seen in the office by Dr. Silverio Decamp in April of this year for evaluation of dysphagia, GERD symptoms and nausea.  She had a remote history of PUD/gastric outlet obstruction.  Her symptoms were concerning for recurrent disease.  She underwent EGD which showed chronic benign-appearing pyloric stenosis, status post dilation, nonspecific gastritis.  Pyloric stricture biopsy showed reactive gastropathy with erosions  05/04/20   return office visit with Dr. Silverio Decamp.  Symptoms worsen post EGD.  Plan to start Dexilant and Carafate.  She was have possible constipation so Linzess was started.  For evaluation bar deficiency anemia, bowel habit changes, persistent GERD symptoms and nausea patient was scheduled for another EGD as well as colonoscopy.  At some point following that visit she was scheduled for an upper GI series as well  05/17/20  right-sided abdominal pain worsened in the interim.  Patient went to the ED.  She had known cholelithiasis but ultrasound at hospital did not menstruated.  HIDA scan obtained anda negative.  Plan was for EGD in the hospital however [facility] evaluated and took patient right away to the OR for cholecystectomy.  We made patient an office follow-up with plans to schedule her outpatient procedures for evaluation of IDA and pyloric stenosis.  She is already scheduled for those procedures to be done on July 19   INTERVAL HISTORY: Marelyn feels fine.  She has had no further abdominal pain nor nausea.  Her only complaint is that of constipation.  She has not had a bowel movement in 2 weeks.  She has been taking 2 stool softeners and 2 Ex-Lax every day for the last 2 weeks without any results.  He has a follow-up with [facility] next week  Previous Endoscopic Evaluations: 04/21/2020 EGD --Normal GE junction. - Chronic, benign appearing pyloric stenosis. Biopsied and dilated to 36mm with CRE balloon. - Mild, non-specific gastritis was biopsied to check for H. Pylori.  FINAL MICROSCOPIC DIAGNOSIS:   A. PYLORIC STRICTURE, BIOPSY:  - Reactive gastropathy with erosions.  - No intestinal metaplasia, dysplasia, or malignancy.   B. STOMACH, BIOPSY:  - Mild reactive gastropathy with erosions.  - Warthin-Starry is negative for Helicobacter pylori.  - No intestinal metaplasia, dysplasia, or malignancy.    Review of systems:     No chest pain, no SOB, no fevers, no urinary sx   Past Medical History:  Diagnosis Date  . Allergy   . Anemia   . Anxiety   .  Arthritis   . Depression   . GERD (gastroesophageal reflux disease)   . Hypertension   . MVP (mitral valve prolapse)     Patient's surgical history, family medical history, social history, medications and allergies were all reviewed in Epic   Serum creatinine: 0.89 mg/dL 05/20/20 0522 Estimated creatinine clearance: 84.9 mL/min  Current Outpatient Medications    Medication Sig Dispense Refill  . albuterol (VENTOLIN HFA) 108 (90 Base) MCG/ACT inhaler TAKE 2 PUFFS BY MOUTH EVERY 6 HOURS AS NEEDED FOR WHEEZE OR SHORTNESS OF BREATH (Patient taking differently: Inhale 2 puffs into the lungs every 6 (six) hours as needed for wheezing or shortness of breath. ) 18 g 5  . aspirin 81 MG EC tablet Take 81 mg by mouth daily. daily    . aspirin-acetaminophen-caffeine (EXCEDRIN MIGRAINE) 250-250-65 MG tablet Take 2 tablets by mouth every 6 (six) hours as needed for headache.    Marland Kitchen atorvastatin (LIPITOR) 40 MG tablet Take 40 mg by mouth daily.     . carvedilol (COREG) 3.125 MG tablet Take 3.125 mg by mouth in the morning and at bedtime.    . colestipol (COLESTID) 1 g tablet TAKE 1 TABLET (1 G TOTAL) BY MOUTH 2 (TWO) TIMES DAILY. 180 tablet 1  . esomeprazole (NEXIUM) 40 MG capsule Take 1 capsule by mouth twice daily. (Patient taking differently: Take 40 mg by mouth 2 (two) times daily. ) 60 capsule 6  . Ferrous Sulfate (IRON) 325 (65 Fe) MG TABS Take 1 tablet (325 mg total) by mouth daily. 90 each 0  . FLUoxetine (PROZAC) 40 MG capsule Take 40 mg by mouth daily.    . furosemide (LASIX) 40 MG tablet HOLD UNTIL seen by your doctor (Patient taking differently: Take 40 mg by mouth daily. ) 30 tablet   . gabapentin (NEURONTIN) 400 MG capsule Take 400 mg by mouth 3 (three) times daily.     . hydrOXYzine (ATARAX/VISTARIL) 25 MG tablet Take 12.5-25 mg by mouth 3 (three) times daily as needed for anxiety.    Marland Kitchen linaclotide (LINZESS) 145 MCG CAPS capsule Take 1 capsule (145 mcg total) by mouth daily before breakfast. 30 capsule 3  . mirtazapine (REMERON) 45 MG tablet Take 45 mg by mouth at bedtime.     Marland Kitchen oxyCODONE (OXY IR/ROXICODONE) 5 MG immediate release tablet Take 1-2 tablets (5-10 mg total) by mouth every 6 (six) hours as needed for moderate pain, severe pain or breakthrough pain. 15 tablet 0  . OXYGEN Inhale into the lungs. 02 at bedtime    . sucralfate (CARAFATE) 1 g tablet  Take 1 tablet (1 g total) by mouth 4 (four) times daily -  with meals and at bedtime. 120 tablet 3  . ondansetron (ZOFRAN-ODT) 4 MG disintegrating tablet Take 1 tablet by mouth as needed. (Patient not taking: Reported on 06/01/2020)     No current facility-administered medications for this visit.    Physical Exam:     Ht 5' 10.5" (1.791 m)   Wt 246 lb (111.6 kg)   BMI 34.80 kg/m   GENERAL:  Pleasant female in NAD PSYCH: : Cooperative, normal affect CARDIAC:  RRR PULM: Normal respiratory effort, lungs CTA bilaterally, no wheezing ABDOMEN:  Nondistended, soft, mild LLQ tenderness. No obvious masses, no hepatomegaly,  normal bowel sounds SKIN:  turgor, no lesions seen Musculoskeletal:  Normal muscle tone, normal strength NEURO: Alert and oriented x 3, no focal neurologic deficits   Tye Savoy , NP 06/01/2020, 1:40 PM

## 2020-06-01 NOTE — Patient Instructions (Signed)
If you are age 67 or older, your body mass index should be between 23-30. Your Body mass index is 34.8 kg/m. If this is out of the aforementioned range listed, please consider follow up with your Primary Care Provider.  If you are age 61 or younger, your body mass index should be between 19-25. Your Body mass index is 34.8 kg/m. If this is out of the aformentioned range listed, please consider follow up with your Primary Care Provider.   Tye Savoy PA-C recommends that you complete a bowel purge (to clean out your bowels). Please do the following: Purchase a bottle of Miralax over the counter as well as a box of 5 mg dulcolax tablets. Take 4 dulcolax tablets. Wait 1 hour. You will then drink 6-8 capfuls of Miralax mixed in an adequate amount of water/juice/gatorade (you may choose which of these liquids to drink) over the next 2-3 hours. You should expect results within 1 to 6 hours after completing the bowel purge.  After you have completed your Miralax bowel purge, start using 1 capful of Miralax daily in 6-8 ounces of juice or water.  Be sure to increase your water intake to 64 ounces of water daily.  Your follow up will be pending your colonoscopy/endoscopy.

## 2020-06-04 IMAGING — US US ABDOMEN LIMITED
1 series · 14 of 25 positions shown · non-contrast
Comparison: None.

CLINICAL DATA: Nausea and vomiting. Right upper quadrant pain for 5
days.

EXAM:
ULTRASOUND ABDOMEN LIMITED RIGHT UPPER QUADRANT

[Series 1: us abdomen limited · 14 of 44 slices shown]
[im 1/44]
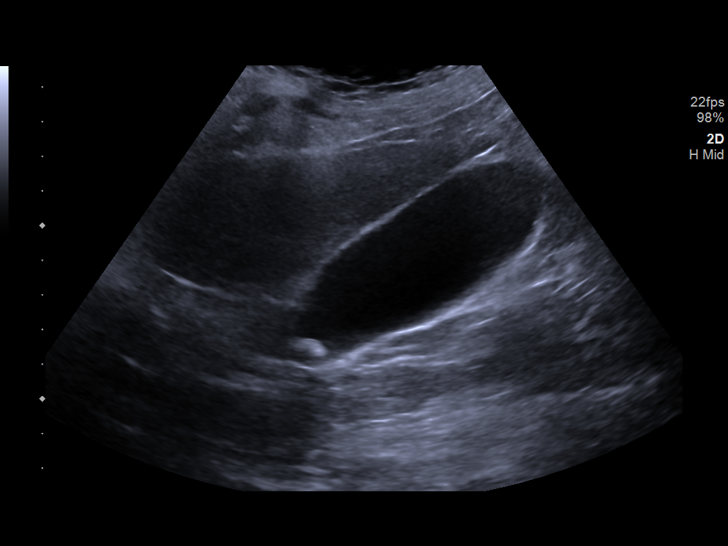
[im 4/44]
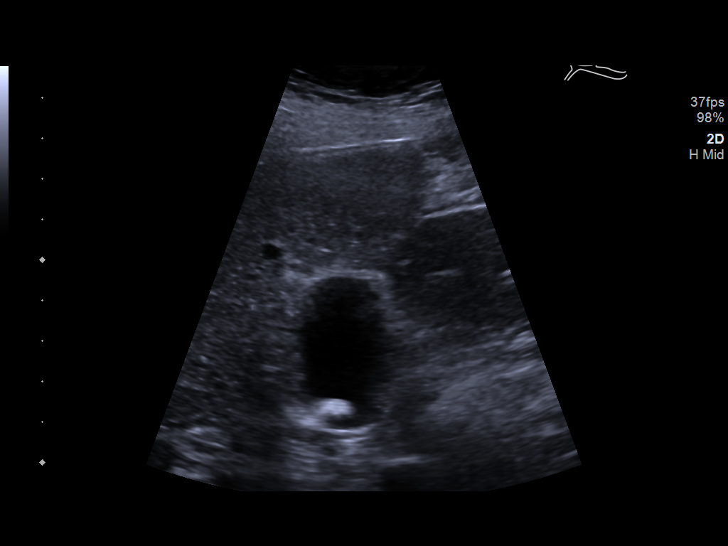
[im 8/44]
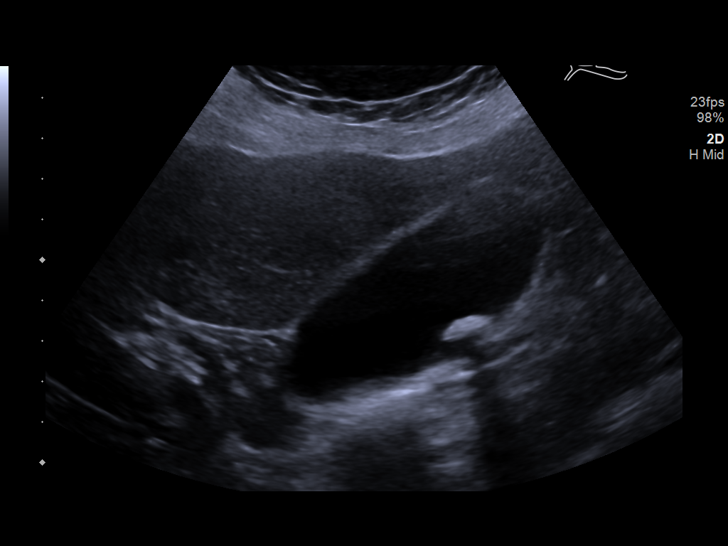
[im 11/44]
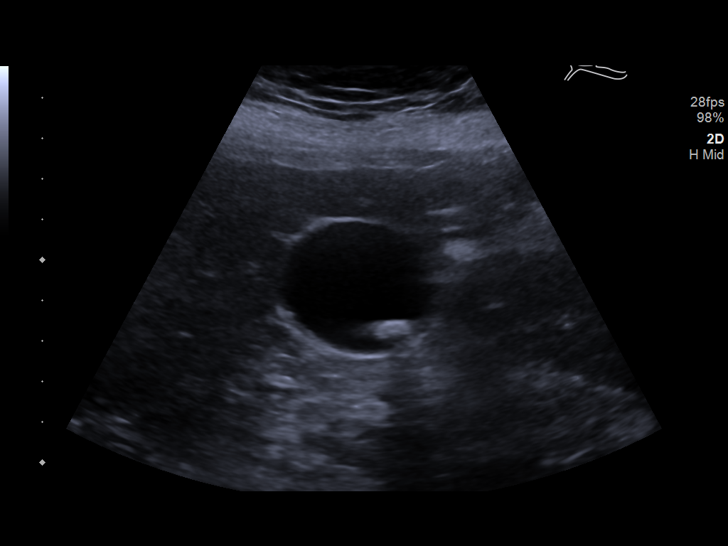
[im 15/44]
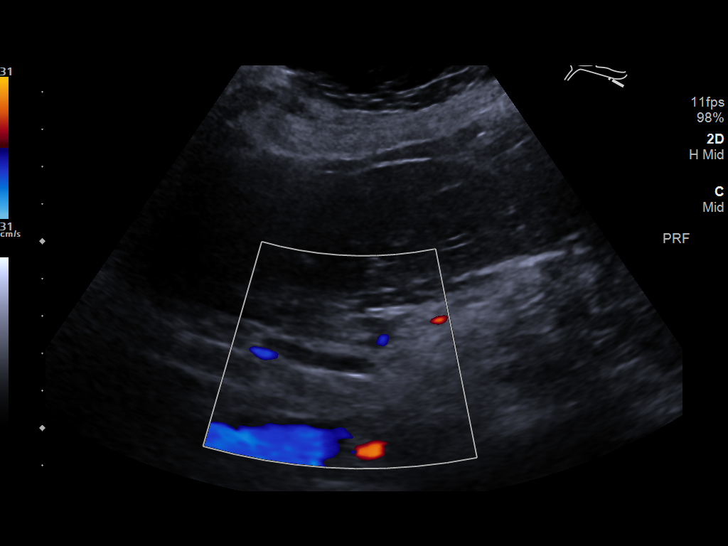
[im 17/44]
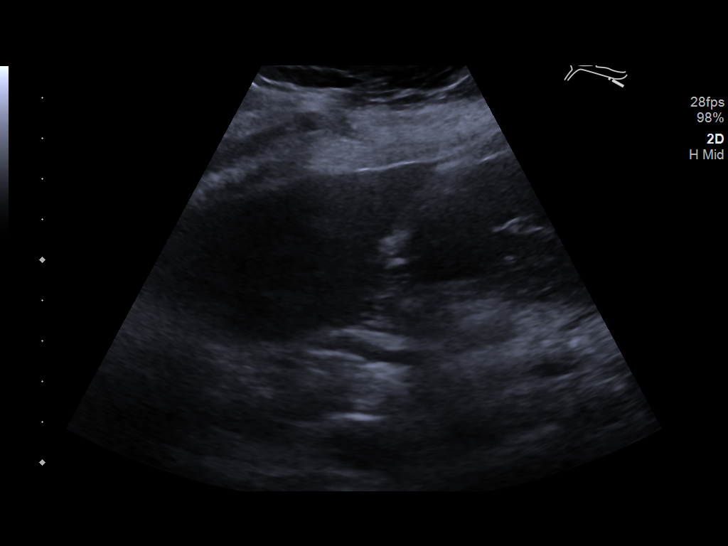
[im 20/44]
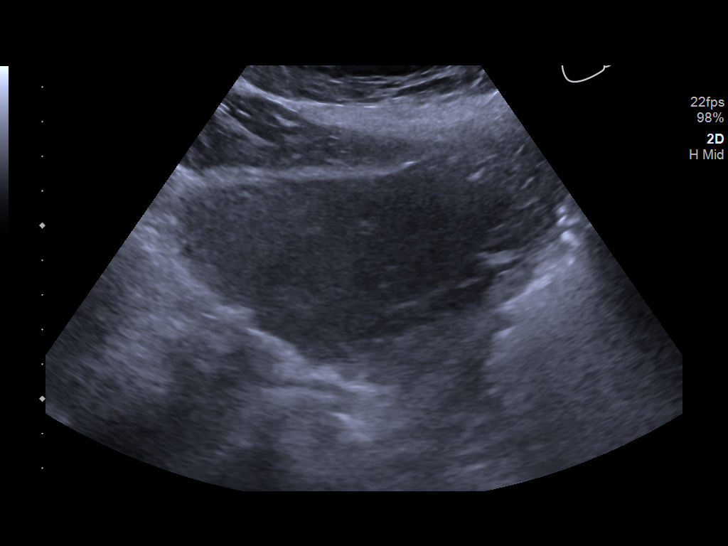
[im 24/44]
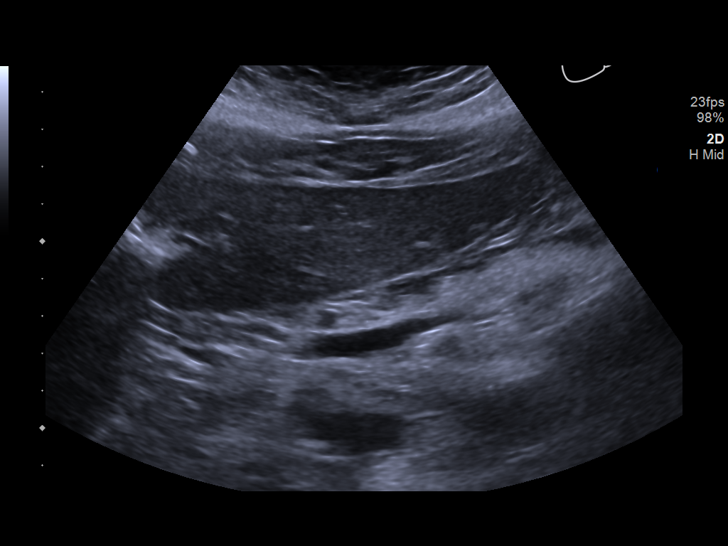
[im 27/44]
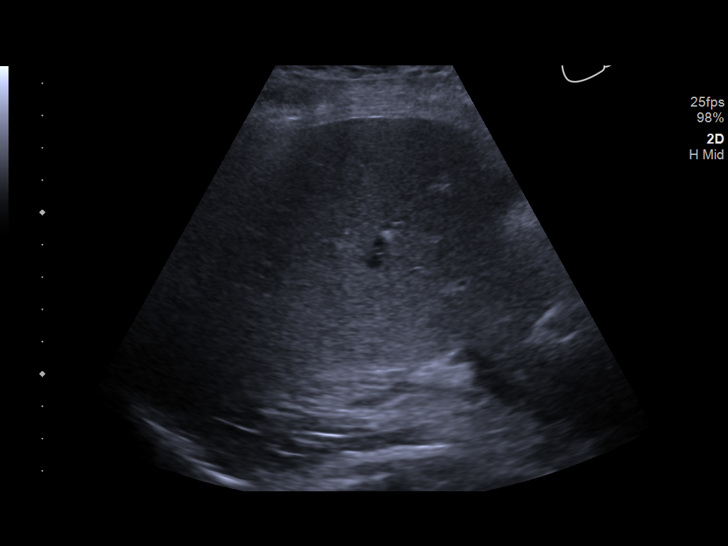
[im 29/44]
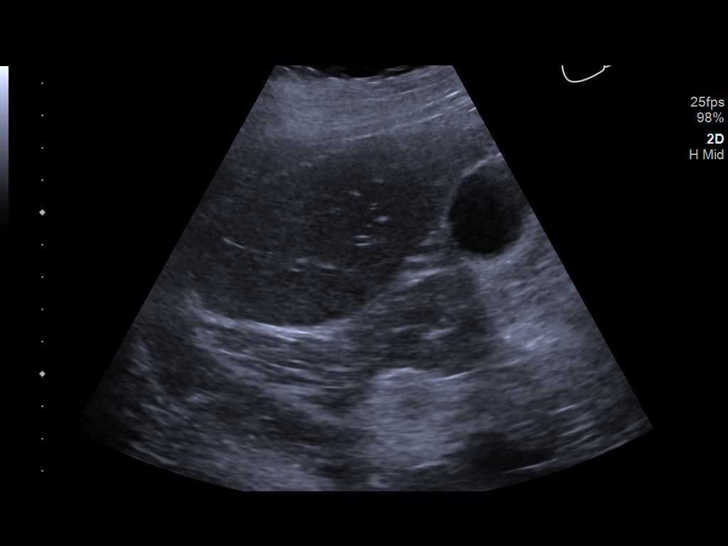
[im 33/44]
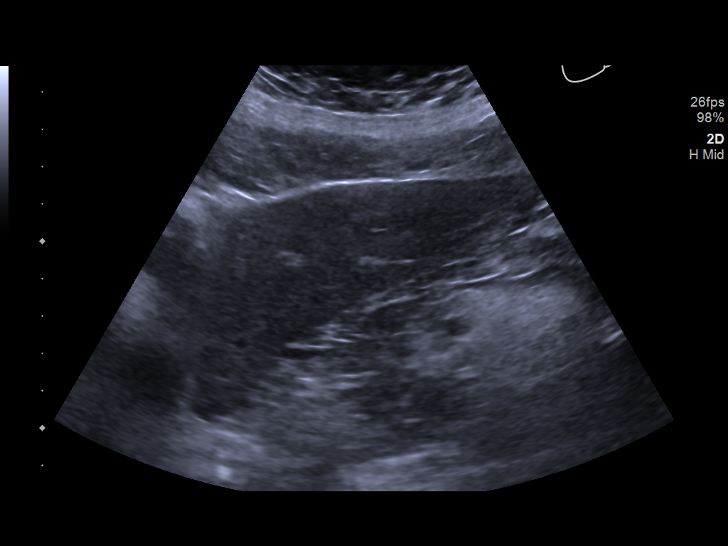
[im 36/44]
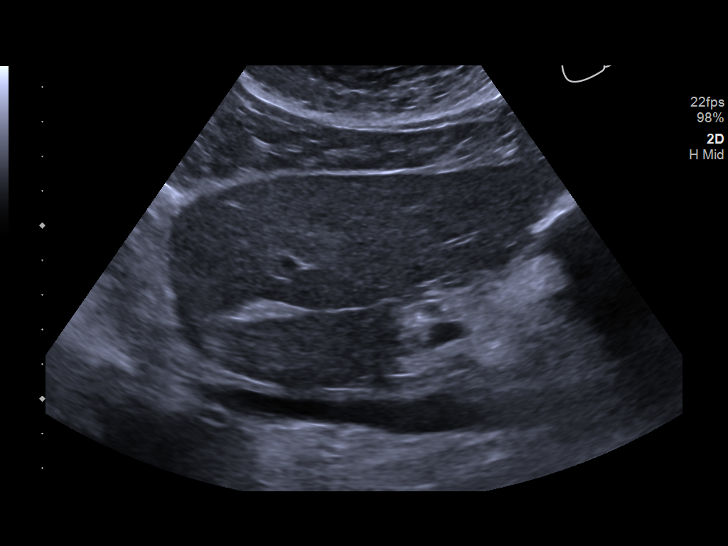
[im 40/44]
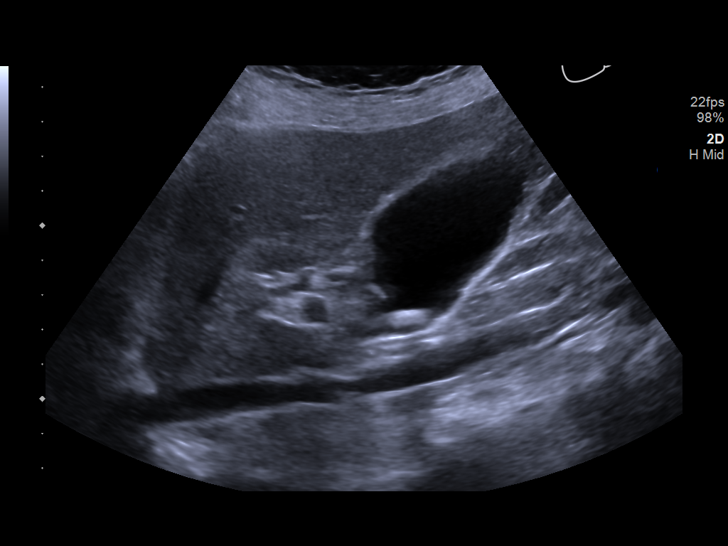
[im 44/44]
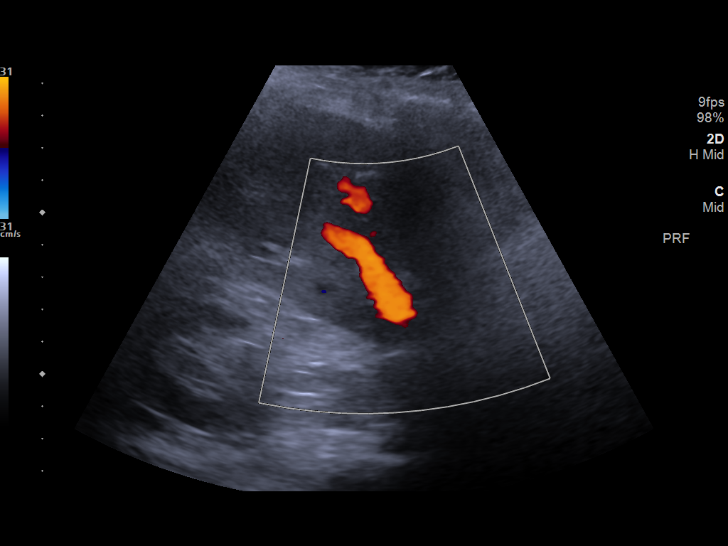

[14 of 25 positions shown; findings below may reference images not displayed]

FINDINGS: Gallbladder:

A single mobile shadowing stone measures up to 1.0 cm. The patient
does exhibit a sonographic Murphy's sign with tenderness over the
gallbladder. There is no gallbladder wall thickening.

Common bile duct:

Diameter: 4.7 mm, within normal limits

Liver:

No focal lesion identified. Within normal limits in parenchymal
echogenicity. Portal vein is patent on color Doppler imaging with
normal direction of blood flow towards the liver.
IMPRESSION: 1. Cholelithiasis.  A single shadowing stone measures up to 10 mm.
2. Positive sonographic Murphy sign. In the setting of
cholelithiasis, this raises concern for early cholecystitis despite
absence of gallbladder wall thickening.

## 2020-06-06 IMAGING — NM NM HEPATOBILIARY IMAGE, INC GB
1 series · 6 of 6 positions shown · non-contrast
Comparison: None.

CLINICAL DATA: Nausea, vomiting, right upper quadrant pain for 5
days

EXAM:
NUCLEAR MEDICINE HEPATOBILIARY IMAGING
TECHNIQUE: Sequential images of the abdomen were obtained [DATE] minutes
following intravenous administration of radiopharmaceutical.
RADIOPHARMACEUTICALS:  5.3 mCi 5c-AAm  Choletec IV

[he hepatobiliary · 4.52mm/px · 6 of 60 frames shown]
[frame 6/60]
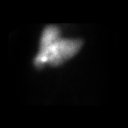
[frame 16/60]
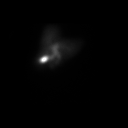
[frame 26/60]
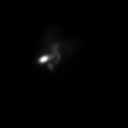
[frame 36/60]
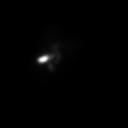
[frame 46/60]
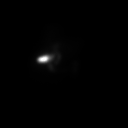
[frame 56/60]
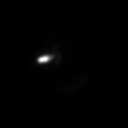

[6 of 6 positions shown; findings below may reference images not displayed]

FINDINGS: Prompt uptake and biliary excretion of activity by the liver is
seen. Gallbladder activity is visualized, consistent with patency of
cystic duct. Biliary activity passes into small bowel, consistent
with patent common bile duct.
IMPRESSION: Normal hepatobiliary scan. No scintigraphic evidence of acute
cholecystitis or biliary obstruction.

## 2020-06-06 IMAGING — DX DG CHEST 1V PORT
1 series · 1 of 1 positions shown · non-contrast
Comparison: 02/16/2019

CLINICAL DATA: Shortness of breath and cough

EXAM:
PORTABLE CHEST 1 VIEW

[chest ap]
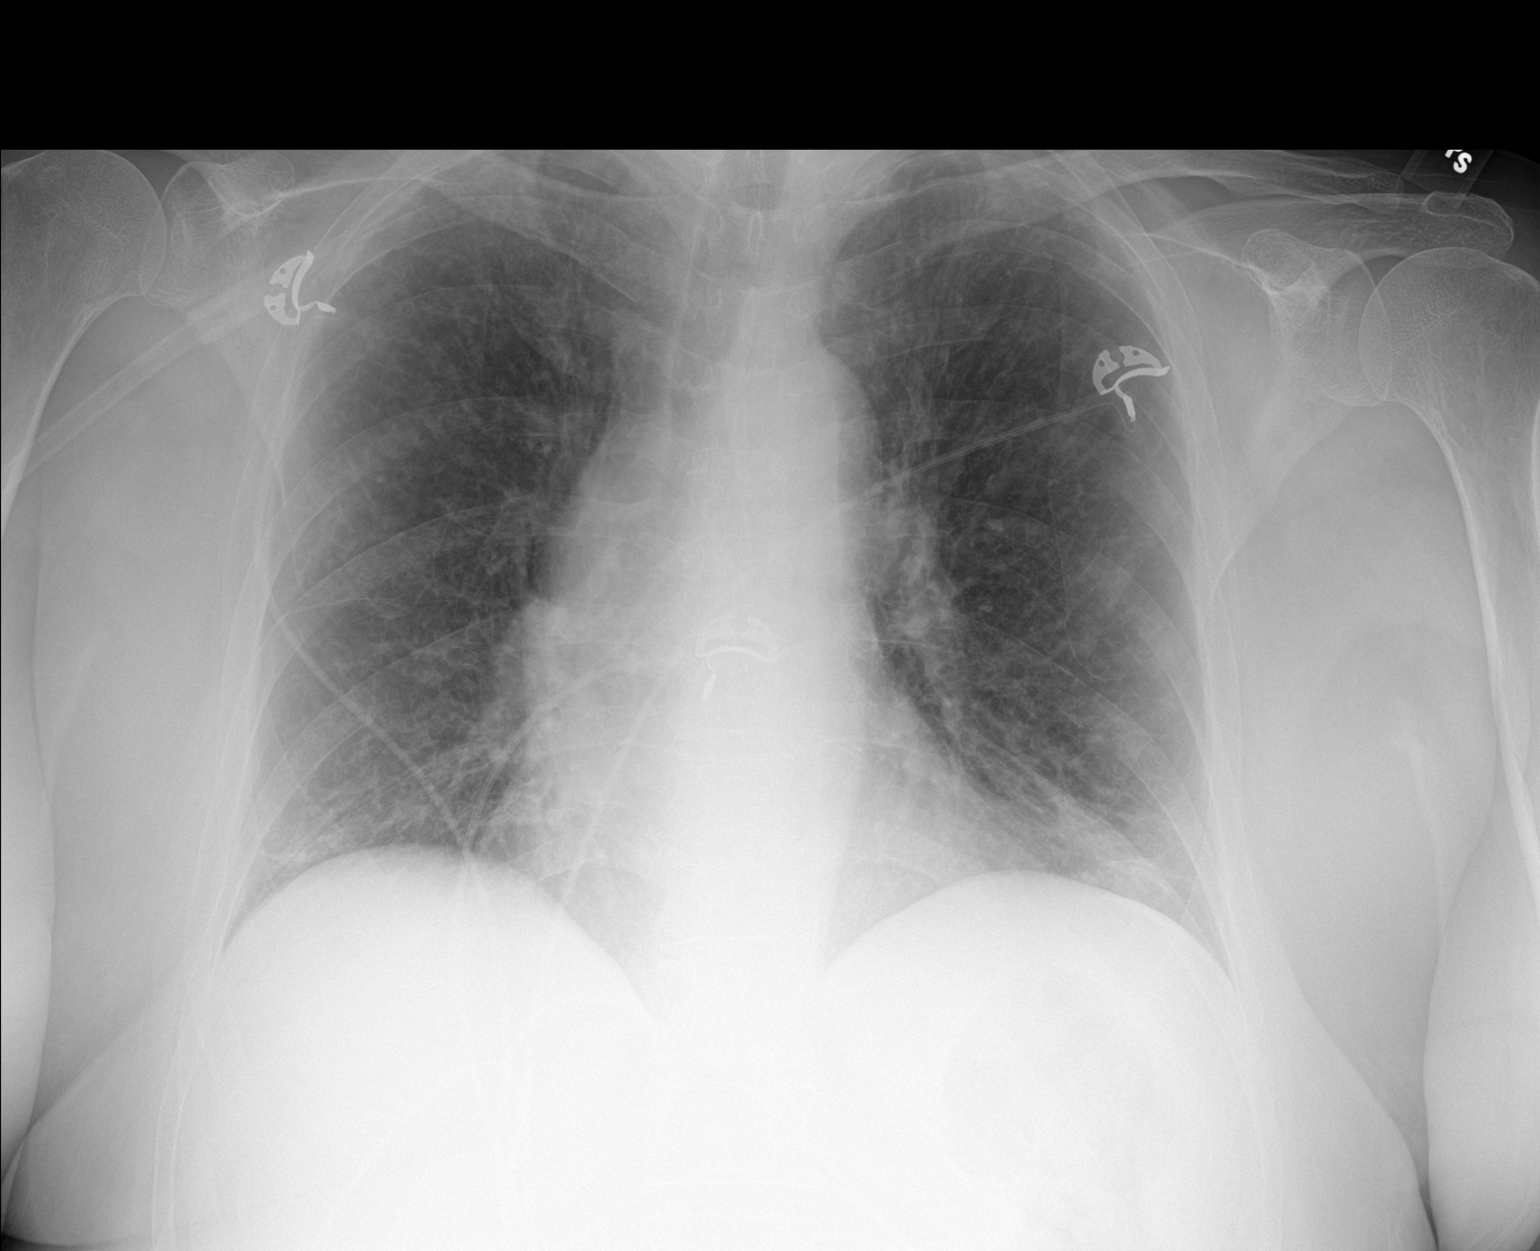

[1 of 1 positions shown; findings below may reference images not displayed]

FINDINGS: Cardiac shadows within normal limits. The lungs are well aerated
bilaterally. Mild left basilar atelectasis is noted. No sizable
effusion is seen. No bony abnormality is noted.
IMPRESSION: Mild left basilar atelectasis.

## 2020-06-16 NOTE — Progress Notes (Signed)
Reviewed and agree with documentation and assessment and plan. K. Veena Ethelwyn Gilbertson , MD   

## 2020-06-29 ENCOUNTER — Other Ambulatory Visit (HOSPITAL_COMMUNITY)
Admission: RE | Admit: 2020-06-29 | Discharge: 2020-06-29 | Disposition: A | Discharge status: Home / Self Care | Payer: Medicare HMO | Source: Ambulatory Visit | Attending: Gastroenterology | Admitting: Gastroenterology

## 2020-06-29 DIAGNOSIS — Z20822 Contact with and (suspected) exposure to covid-19: Secondary | ICD-10-CM | POA: Insufficient documentation

## 2020-06-29 DIAGNOSIS — Z01812 Encounter for preprocedural laboratory examination: Secondary | ICD-10-CM | POA: Insufficient documentation

## 2020-06-29 LAB — SARS CORONAVIRUS 2 (TAT 6-24 HRS): SARS Coronavirus 2: NEGATIVE

## 2020-06-30 ENCOUNTER — Other Ambulatory Visit: Payer: Self-pay

## 2020-06-30 ENCOUNTER — Telehealth: Payer: Self-pay | Admitting: Nurse Practitioner

## 2020-06-30 MED ORDER — ONDANSETRON 4 MG PO TBDP: 4.0000 mg | ORAL_TABLET | Freq: Three times a day (TID) | ORAL | 2 refills | Status: DC | PRN

## 2020-06-30 NOTE — Telephone Encounter (Signed)
Called the patient. No answer. Left her a message that the prescription has been sent in to her [facility] in [place].

## 2020-06-30 NOTE — Telephone Encounter (Signed)
Yes, please give her Zofran 4 mg PO q 8 hours prn nausea # 30 with 2 refills. Thanks

## 2020-06-30 NOTE — Telephone Encounter (Signed)
Pt would like something prescribed for nausea. She states that she has been experiencing that for a couple of days. [facility] is [facility] on [address].

## 2020-06-30 NOTE — Telephone Encounter (Signed)
[facility] Patient scheduled for EGD with Dr Silverio Decamp on 07/04/20. You saw her 06/01/20 for IDA. Cholecystectomy 05/18/20. She is retaining fluids today. Last time she vomited was last night. Complaint is of ongoing queasy, nauseous feeling and food aversion. She has had Zofran in the past. Can she have it refilled for her nausea?

## 2020-06-30 NOTE — Telephone Encounter (Signed)
Pt saw Morgan Savoy NP last, note should be sent to [facility].

## 2020-07-03 ENCOUNTER — Encounter (HOSPITAL_COMMUNITY): Payer: Self-pay | Admitting: Gastroenterology

## 2020-07-03 NOTE — Anesthesia Preprocedure Evaluation (Addendum)
Anesthesia Evaluation  Patient identified by MRN, date of birth, ID band Patient awake    Reviewed: Allergy & Precautions, NPO status , Patient's Chart, lab work & pertinent test results, reviewed documented beta blocker date and time   Airway Mallampati: II  TM Distance: >3 FB Neck ROM: Full    Dental  (+) Edentulous Upper, Edentulous Lower   Pulmonary COPD,  COPD inhaler, former smoker,    Pulmonary exam normal breath sounds clear to auscultation       Cardiovascular hypertension, Pt. on medications and Pt. on home beta blockers + CAD and +CHF  Normal cardiovascular exam+ Valvular Problems/Murmurs MR  Rhythm:Regular Rate:Normal     Neuro/Psych  Headaches, PSYCHIATRIC DISORDERS Anxiety Depression Tremor Occipital neuralgia right CVA, No Residual Symptoms    GI/Hepatic PUD, GERD  Medicated and Controlled,  Endo/Other  Hyperlipidemia  Renal/GU Renal InsufficiencyRenal disease  negative genitourinary   Musculoskeletal  (+) Arthritis , Osteoarthritis,    Abdominal (+) + obese,   Peds  Hematology  (+) anemia ,   Anesthesia Other Findings   Reproductive/Obstetrics                            Anesthesia Physical Anesthesia Plan  ASA: III  Anesthesia Plan: MAC   Post-op Pain Management:    Induction: Intravenous  PONV Risk Score and Plan: Propofol infusion, Ondansetron and Treatment may vary due to age or medical condition  Airway Management Planned: Natural Airway, Nasal Cannula and Simple Face Mask  Additional Equipment:   Intra-op Plan:   Post-operative Plan:   Informed Consent: I have reviewed the patients History and Physical, chart, labs and discussed the procedure including the risks, benefits and alternatives for the proposed anesthesia with the patient or authorized representative who has indicated his/her understanding and acceptance.       Plan Discussed with: CRNA and  Anesthesiologist  Anesthesia Plan Comments:        Anesthesia Quick Evaluation

## 2020-07-04 ENCOUNTER — Encounter (HOSPITAL_COMMUNITY)
Admission: RE | Disposition: A | Discharge status: Home / Self Care | Payer: Self-pay | Source: Home / Self Care | Attending: Gastroenterology

## 2020-07-04 ENCOUNTER — Ambulatory Visit (HOSPITAL_COMMUNITY): Payer: Medicare HMO | Admitting: Anesthesiology

## 2020-07-04 ENCOUNTER — Ambulatory Visit (HOSPITAL_COMMUNITY)
Admission: RE | Admit: 2020-07-04 | Discharge: 2020-07-04 | Disposition: A | Discharge status: Home / Self Care | Payer: Medicare HMO | Attending: Gastroenterology | Admitting: Gastroenterology

## 2020-07-04 ENCOUNTER — Encounter (HOSPITAL_COMMUNITY): Payer: Self-pay | Admitting: Gastroenterology

## 2020-07-04 DIAGNOSIS — I130 Hypertensive heart and chronic kidney disease with heart failure and stage 1 through stage 4 chronic kidney disease, or unspecified chronic kidney disease: Secondary | ICD-10-CM | POA: Insufficient documentation

## 2020-07-04 DIAGNOSIS — F329 Major depressive disorder, single episode, unspecified: Secondary | ICD-10-CM | POA: Diagnosis not present

## 2020-07-04 DIAGNOSIS — K2970 Gastritis, unspecified, without bleeding: Secondary | ICD-10-CM

## 2020-07-04 DIAGNOSIS — D509 Iron deficiency anemia, unspecified: Secondary | ICD-10-CM | POA: Diagnosis not present

## 2020-07-04 DIAGNOSIS — K259 Gastric ulcer, unspecified as acute or chronic, without hemorrhage or perforation: Secondary | ICD-10-CM | POA: Insufficient documentation

## 2020-07-04 DIAGNOSIS — D120 Benign neoplasm of cecum: Secondary | ICD-10-CM | POA: Insufficient documentation

## 2020-07-04 DIAGNOSIS — K2990 Gastroduodenitis, unspecified, without bleeding: Secondary | ICD-10-CM

## 2020-07-04 DIAGNOSIS — K648 Other hemorrhoids: Secondary | ICD-10-CM | POA: Insufficient documentation

## 2020-07-04 DIAGNOSIS — D125 Benign neoplasm of sigmoid colon: Secondary | ICD-10-CM | POA: Diagnosis not present

## 2020-07-04 DIAGNOSIS — K2100 Gastro-esophageal reflux disease with esophagitis, without bleeding: Secondary | ICD-10-CM | POA: Insufficient documentation

## 2020-07-04 DIAGNOSIS — Z7982 Long term (current) use of aspirin: Secondary | ICD-10-CM | POA: Diagnosis not present

## 2020-07-04 DIAGNOSIS — Z79899 Other long term (current) drug therapy: Secondary | ICD-10-CM | POA: Diagnosis not present

## 2020-07-04 DIAGNOSIS — N189 Chronic kidney disease, unspecified: Secondary | ICD-10-CM | POA: Diagnosis not present

## 2020-07-04 DIAGNOSIS — Z87891 Personal history of nicotine dependence: Secondary | ICD-10-CM | POA: Diagnosis not present

## 2020-07-04 DIAGNOSIS — R1310 Dysphagia, unspecified: Secondary | ICD-10-CM

## 2020-07-04 DIAGNOSIS — K635 Polyp of colon: Secondary | ICD-10-CM | POA: Diagnosis not present

## 2020-07-04 DIAGNOSIS — K2971 Gastritis, unspecified, with bleeding: Secondary | ICD-10-CM | POA: Insufficient documentation

## 2020-07-04 DIAGNOSIS — I509 Heart failure, unspecified: Secondary | ICD-10-CM | POA: Insufficient documentation

## 2020-07-04 DIAGNOSIS — J449 Chronic obstructive pulmonary disease, unspecified: Secondary | ICD-10-CM | POA: Insufficient documentation

## 2020-07-04 DIAGNOSIS — F419 Anxiety disorder, unspecified: Secondary | ICD-10-CM | POA: Insufficient documentation

## 2020-07-04 DIAGNOSIS — E785 Hyperlipidemia, unspecified: Secondary | ICD-10-CM | POA: Insufficient documentation

## 2020-07-04 DIAGNOSIS — K5730 Diverticulosis of large intestine without perforation or abscess without bleeding: Secondary | ICD-10-CM | POA: Diagnosis not present

## 2020-07-04 DIAGNOSIS — K311 Adult hypertrophic pyloric stenosis: Secondary | ICD-10-CM | POA: Diagnosis not present

## 2020-07-04 DIAGNOSIS — K319 Disease of stomach and duodenum, unspecified: Secondary | ICD-10-CM | POA: Insufficient documentation

## 2020-07-04 DIAGNOSIS — K449 Diaphragmatic hernia without obstruction or gangrene: Secondary | ICD-10-CM | POA: Diagnosis not present

## 2020-07-04 HISTORY — PX: ESOPHAGOGASTRODUODENOSCOPY (EGD) WITH PROPOFOL: SHX5813

## 2020-07-04 HISTORY — PX: POLYPECTOMY: SHX5525

## 2020-07-04 HISTORY — PX: COLONOSCOPY WITH PROPOFOL: SHX5780

## 2020-07-04 HISTORY — PX: BALLOON DILATION: SHX5330

## 2020-07-04 HISTORY — PX: BIOPSY: SHX5522

## 2020-07-04 SURGERY — ESOPHAGOGASTRODUODENOSCOPY (EGD) WITH PROPOFOL
Anesthesia: Monitor Anesthesia Care

## 2020-07-04 MED ORDER — PROPOFOL 500 MG/50ML IV EMUL
INTRAVENOUS | Status: AC
  Filled 2020-07-04: qty 100

## 2020-07-04 MED ORDER — HYDRALAZINE HCL 20 MG/ML IJ SOLN
INTRAMUSCULAR | Status: AC
  Filled 2020-07-04: qty 1

## 2020-07-04 MED ORDER — SODIUM CHLORIDE 0.9 % IV SOLN: INTRAVENOUS | Status: DC

## 2020-07-04 MED ORDER — LIDOCAINE 2% (20 MG/ML) 5 ML SYRINGE
INTRAMUSCULAR | Status: DC | PRN
  Administered 2020-07-04: 140 mg via INTRAVENOUS

## 2020-07-04 MED ORDER — ONDANSETRON HCL 4 MG/2ML IJ SOLN
INTRAMUSCULAR | Status: AC
  Filled 2020-07-04: qty 2

## 2020-07-04 MED ORDER — PROPOFOL 10 MG/ML IV BOLUS
INTRAVENOUS | Status: AC
  Filled 2020-07-04: qty 20

## 2020-07-04 MED ORDER — LACTATED RINGERS IV SOLN: INTRAVENOUS | Status: DC

## 2020-07-04 MED ORDER — HYDRALAZINE HCL 20 MG/ML IJ SOLN
10.0000 mg | Freq: Once | INTRAMUSCULAR | Status: AC
  Administered 2020-07-04: 10 mg via INTRAVENOUS

## 2020-07-04 MED ORDER — ONDANSETRON HCL 4 MG/2ML IJ SOLN
4.0000 mg | Freq: Once | INTRAMUSCULAR | Status: AC
  Administered 2020-07-04: 4 mg via INTRAVENOUS

## 2020-07-04 MED ORDER — PROPOFOL 500 MG/50ML IV EMUL
INTRAVENOUS | Status: DC | PRN
  Administered 2020-07-04: 150 ug/kg/min via INTRAVENOUS

## 2020-07-04 MED ORDER — PROPOFOL 10 MG/ML IV BOLUS
INTRAVENOUS | Status: DC | PRN
  Administered 2020-07-04: 20 mg via INTRAVENOUS
  Administered 2020-07-04: 50 mg via INTRAVENOUS
  Administered 2020-07-04: 30 mg via INTRAVENOUS
  Administered 2020-07-04: 20 mg via INTRAVENOUS
  Administered 2020-07-04: 30 mg via INTRAVENOUS

## 2020-07-04 SURGICAL SUPPLY — 24 items

## 2020-07-04 NOTE — Transfer of Care (Signed)
Immediate Anesthesia Transfer of Care Note  Patient: Morgan [name]  Procedure(s) Performed: Procedure(s): ESOPHAGOGASTRODUODENOSCOPY (EGD) WITH PROPOFOL (N/A) COLONOSCOPY WITH PROPOFOL (N/A) BIOPSY BALLOON DILATION (N/A) POLYPECTOMY  Patient Location: PACU  Anesthesia Type:MAC  Level of Consciousness: Patient easily awoken, sedated, comfortable, cooperative, following commands, responds to stimulation.   Airway & Oxygen Therapy: Patient spontaneously breathing, ventilating well, oxygen via simple oxygen mask.  Post-op Assessment: Report given to PACU RN, vital signs reviewed and stable, moving all extremities.   Post vital signs: Reviewed and stable.  Complications: No apparent anesthesia complications  Last Vitals:  Vitals Value Taken Time  BP 167/86 07/04/20 1023  Temp    Pulse 63 07/04/20 1025  Resp 21 07/04/20 1025  SpO2 100 % 07/04/20 1025  Vitals shown include unvalidated device data.  Last Pain:  Vitals:   07/04/20 0744  TempSrc: Oral  PainSc: 0-No pain         Complications: No complications documented.

## 2020-07-04 NOTE — Progress Notes (Signed)
Pt blood pressure remaining high 180s-200s after attempt to lower with IV hydralazine 10 mg. Verbal orders give by Royce Macadamia, MD to administer another 10 mg monitor pt and then ok to send home to take home blood pressure medications. Pt states she took now of her medications this morning prior to her schedule procedure. Will continue to monitor.

## 2020-07-04 NOTE — H&P (Signed)
[facility] History and Physical   Primary Care Physician:  [name], MD   Reason for Procedure:  Iron deficiency anemia, dysphagia, GERD, nausea  Plan:    EGD and colonoscopy with possible intervention including dilation     HPI: Morgan [name] is a 69 y.o. female with h/o COPD, CHF, CKD and recent iron deficiency anemia here for diagnostic EGD and colonoscopy.  H/o pyloric stenosis s/p dilation The risks and benefits as well as alternatives of endoscopic procedure(s) have been discussed and reviewed. All questions answered. The patient agrees to proceed.    Past Medical History:  Diagnosis Date  . Allergy   . Anemia   . Anxiety   . Arthritis   . Depression   . GERD (gastroesophageal reflux disease)   . Hypertension   . MVP (mitral valve prolapse)     Past Surgical History:  Procedure Laterality Date  . ABDOMINAL HYSTERECTOMY    . APPENDECTOMY    . BALLOON DILATION N/A 04/21/2020   Procedure: BALLOON DILATION;  Surgeon: Milus Banister, MD;  Location: [facility];  Service: Endoscopy;  Laterality: N/A;  pyloric/ GI  . BIOPSY  04/21/2020   Procedure: BIOPSY;  Surgeon: Milus Banister, MD;  Location: [facility];  Service: Endoscopy;;  . BREAST SURGERY Right 1988   tumor removal  . CHOLECYSTECTOMY N/A 05/18/2020   Procedure: LAPAROSCOPIC CHOLECYSTECTOMY;  Surgeon: Johnathan Hausen, MD;  Location: [facility];  Service: General;  Laterality: N/A;  . ESOPHAGOGASTRODUODENOSCOPY (EGD) WITH PROPOFOL N/A 04/21/2020   Procedure: ESOPHAGOGASTRODUODENOSCOPY (EGD) WITH PROPOFOL;  Surgeon: Milus Banister, MD;  Location: [facility];  Service: Endoscopy;  Laterality: N/A;  . KNEE SURGERY  2005   2 times left  . TUBAL LIGATION      Prior to Admission medications   Medication Sig Start Date End Date Taking? Authorizing Provider  aspirin 81 MG EC tablet Take 81 mg by mouth daily. daily   Yes [provider]  aspirin-acetaminophen-caffeine (EXCEDRIN MIGRAINE)  [phone] MG tablet Take 2 tablets by mouth every 6 (six) hours as needed for headache.   Yes [provider]  atorvastatin (LIPITOR) 40 MG tablet Take 40 mg by mouth daily.    Yes [provider]  carvedilol (COREG) 3.125 MG tablet Take 3.125 mg by mouth in the morning and at bedtime.   Yes [provider]  colestipol (COLESTID) 1 g tablet TAKE 1 TABLET (1 G TOTAL) BY MOUTH 2 (TWO) TIMES DAILY. 07/20/19  Yes Ilithyia Titzer, Venia Minks, MD  esomeprazole (NEXIUM) 40 MG capsule Take 1 capsule by mouth twice daily. Patient taking differently: Take 40 mg by mouth 2 (two) times daily.  03/30/20  Yes Esterwood, Amy S, PA-C  Ferrous Sulfate (IRON) 325 (65 Fe) MG TABS Take 1 tablet (325 mg total) by mouth daily. 04/09/19  Yes [name], MD  FLUoxetine (PROZAC) 40 MG capsule Take 40 mg by mouth daily.   Yes [provider]  furosemide (LASIX) 40 MG tablet HOLD UNTIL seen by your doctor Patient taking differently: Take 40 mg by mouth daily.  02/19/19  Yes Oretha Milch D, MD  gabapentin (NEURONTIN) 400 MG capsule Take 400 mg by mouth 3 (three) times daily.  10/20/18  Yes [provider]  hydrOXYzine (ATARAX/VISTARIL) 25 MG tablet Take 12.5-25 mg by mouth 3 (three) times daily as needed for anxiety.   Yes [provider]  linaclotide (LINZESS) 145 MCG CAPS capsule Take 1 capsule (145 mcg total) by mouth  daily before breakfast. 05/04/20  Yes Sheridan Hew, Venia Minks, MD  mirtazapine (REMERON) 45 MG tablet Take 45 mg by mouth at bedtime.    Yes [provider]  ondansetron (ZOFRAN-ODT) 4 MG disintegrating tablet Take 1 tablet (4 mg total) by mouth every 8 (eight) hours as needed for nausea or vomiting. 06/30/20  Yes Willia Craze, NP  OXYGEN Inhale into the lungs. 02 at bedtime   Yes [provider]  albuterol (VENTOLIN HFA) 108 (90 Base) MCG/ACT inhaler TAKE 2 PUFFS BY MOUTH EVERY 6 HOURS AS NEEDED FOR WHEEZE OR SHORTNESS OF BREATH Patient not  taking: Reported on 06/21/2020 02/22/20   [name], MD  oxyCODONE (OXY IR/ROXICODONE) 5 MG immediate release tablet Take 1-2 tablets (5-10 mg total) by mouth every 6 (six) hours as needed for moderate pain, severe pain or breakthrough pain. Patient not taking: Reported on 06/21/2020 05/20/20   Norm Parcel, PA-C  sucralfate (CARAFATE) 1 g tablet Take 1 tablet (1 g total) by mouth 4 (four) times daily -  with meals and at bedtime. Patient not taking: Reported on 06/21/2020 05/04/20   Mauri Pole, MD    Current Facility-Administered Medications  Medication Dose Route Frequency Provider Last Rate Last Admin  . lactated ringers infusion   Intravenous Continuous Mauri Pole, MD 10 mL/hr at 07/04/20 0757 New Bag at 07/04/20 0757    Allergies as of 04/21/2020 - Review Complete 04/21/2020  Allergen Reaction Noted  . Demerol [meperidine] Hives 04/28/2013  . Strawberry (diagnostic) Hives 08/26/2017  . Wasp venom Hives 04/11/2020    Family History  Problem Relation Age of Onset  . Breast cancer Mother 20  . Ovarian cancer Mother 73  . Brain cancer Mother   . Hypertension Father   . Hyperlipidemia Father   . Heart disease Father   . Breast cancer Maternal Grandmother 65  . Diabetes Paternal Grandmother   . Breast cancer Paternal Grandmother 12  . Cancer Paternal Grandfather   . Breast cancer Maternal Aunt 60  . Breast cancer Maternal Aunt 60    Social History   Socioeconomic History  . Marital status: Single    Spouse name: Not on file  . Number of children: 2  . Years of education: Not on file  . Highest education level: Not on file  Occupational History  . Not on file  Tobacco Use  . Smoking status: Former Smoker    Packs/day: 1.00    Years: 15.00    Pack years: 15.00    Types: Cigarettes    Quit date: 09/12/2017    Years since quitting: 2.8  . Smokeless tobacco: Never Used  Vaping Use  . Vaping Use: Never used  Substance and Sexual Activity  . Alcohol  use: Not Currently  . Drug use: No  . Sexual activity: Not Currently  Other Topics Concern  . 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 Difficulty of Paying Living Expenses:  Food Insecurity:   . Worried About Charity fundraiser in the Last Year:   . Arboriculturist in the Last Year:   Transportation Needs:   . Film/video editor (Medical):   Marland Kitchen Lack of Transportation (Non-Medical):   Physical Activity:   . Days of Exercise per Week:   . Minutes of Exercise per Session:   Stress:   . Feeling of Stress :   Social Connections:   . Frequency of Communication with Friends and Family:   . Frequency of Social Gatherings with Friends and Family:   . Attends Religious Services:   . Active Member of Clubs or Organizations:   . Attends Archivist Meetings:   Marland Kitchen Marital Status:   Intimate Partner Violence:   . Fear of Current or Ex-Partner:   . Emotionally Abused:   Marland Kitchen Physically Abused:   . Sexually Abused:     Review of Systems: All other review of systems negative except as mentioned in the HPI.  Physical Exam: Vital signs in last 24 hours: Temp:  [98.5 F (36.9 C)] 98.5 F (36.9 C) (07/19 0744) Pulse Rate:  [78] 78 (07/19 0744) Resp:  [20] 20 (07/19 0744) BP: (174-192)/(78-83) 174/83 (07/19 0751) SpO2:  [95 %] 95 % (07/19 0744) Weight:  [110.2 kg] 110.2 kg (07/19 0744)   General:   Alert,  pleasant and cooperative in NAD Lungs:  Clear throughout to auscultation.   Heart:  Regular rate and  rhythm; no murmurs, clicks, rubs,  or gallops. Abdomen:  Soft, nontender and nondistended. Normal bowel sounds.   Neuro/Psych:  Alert and cooperative. Normal mood and affect. A and O x 3   K. Denzil Magnuson , MD [phone]

## 2020-07-04 NOTE — Op Note (Signed)
[facility] Patient Name: Morgan [name] Procedure Date: 07/04/2020 MRN: [id number] Attending MD: Mauri Pole , MD Date of Birth: 1951/07/21 CSN: [id number] Age: 69 Admit Type: Outpatient Procedure:                Colonoscopy Indications:              Unexplained iron deficiency anemia Providers:                Mauri Pole, MD, Burtis Junes, RN, Elspeth Cho Tech., Technician, Heide Scales, CRNA Referring MD:              Medicines:                Monitored Anesthesia Care Complications:            No immediate complications. Estimated Blood Loss:     Estimated blood loss was minimal. Procedure:                Pre-Anesthesia Assessment:                           - Prior to the procedure, a History and Physical                            was performed, and patient medications and                            allergies were reviewed. The patient's tolerance of                            previous anesthesia was also reviewed. The risks                            and benefits of the procedure and the sedation                            options and risks were discussed with the patient.                            All questions were answered, and informed consent                            was obtained. Prior Anticoagulants: The patient has                            taken no previous anticoagulant or antiplatelet                            agents. ASA Grade Assessment: III - A patient with                            severe systemic disease. After reviewing the risks  and benefits, the patient was deemed in                            satisfactory condition to undergo the procedure.                           After obtaining informed consent, the colonoscope                            was passed under direct vision. Throughout the                            procedure, the patient's blood pressure, pulse, and                             oxygen saturations were monitored continuously. The                            PCF-H190DL ([id number]) Olympus pediatric colonscope                            was introduced through the anus and advanced to the                            the cecum, identified by appendiceal orifice and                            ileocecal valve. The colonoscopy was performed                            without difficulty. The patient tolerated the                            procedure well. The quality of the bowel                            preparation was good. The ileocecal valve,                            appendiceal orifice, and rectum were photographed. Scope In: 9:42:44 AM Scope Out: 10:16:35 AM Scope Withdrawal Time: 0 hours 28 minutes 15 seconds  Total Procedure Duration: 0 hours 33 minutes 51 seconds  Findings:      The perianal and digital rectal examinations were normal.      Three sessile polyps were found in the sigmoid colon and cecum. The       polyps were 1 to 2 mm in size. These polyps were removed with a cold       biopsy forceps. Resection and retrieval were complete.      Two sessile polyps were found in the sigmoid colon. The polyps were 4 to       6 mm in size. These polyps were removed with a cold snare. Resection       were complete . Retrieved 1 of the 2 polyps.      Scattered small and large-mouthed diverticula were found  in the sigmoid       colon, descending colon and ascending colon. There was evidence of an       impacted diverticulum.      Non-bleeding internal hemorrhoids were found during retroflexion. The       hemorrhoids were small. Impression:               - Three 1 to 2 mm polyps in the sigmoid colon and                            in the cecum, removed with a cold biopsy forceps.                            Resected and retrieved.                           - Two 4 to 6 mm polyps in the sigmoid colon,                            removed with a cold  snare. Resected and 1 of the 2                            polyps retrieved.                           - Moderate diverticulosis in the sigmoid colon, in                            the descending colon and in the ascending colon.                            There was evidence of an impacted diverticulum.                           - Non-bleeding internal hemorrhoids. Moderate Sedation:      Not Applicable - Patient had care per Anesthesia. Recommendation:           - Patient has a contact number available for                            emergencies. The signs and symptoms of potential                            delayed complications were discussed with the                            patient. Return to normal activities tomorrow.                            Written discharge instructions were provided to the                            patient.                           -  See the other procedure note for documentation of                            additional recommendations.                           - Repeat colonoscopy in 3 - 5 years for                            surveillance based on pathology results. Procedure Code(s):        --- Professional ---                           [phone], Colonoscopy, flexible; with removal of                            tumor(s), polyp(s), or other lesion(s) by snare                            technique                           45380, 80, Colonoscopy, flexible; with biopsy,                            single or multiple Diagnosis Code(s):        --- Professional ---                           K63.5, Polyp of colon                           K64.8, Other hemorrhoids                           D50.9, Iron deficiency anemia, unspecified                           K57.30, Diverticulosis of large intestine without                            perforation or abscess without bleeding CPT copyright 2019 American Medical Association. All rights reserved. The codes documented in  this report are preliminary and upon coder review may  be revised to meet current compliance requirements. Mauri Pole, MD 07/04/2020 10:42:20 AM This report has been signed electronically. Number of Addenda: 0

## 2020-07-04 NOTE — Op Note (Signed)
[facility] Patient Name: Morgan [name] Procedure Date: 07/04/2020 MRN: [id number] Attending MD: Mauri Pole , MD Date of Birth: 1951-02-18 CSN: [id number] Age: 69 Admit Type: Outpatient Procedure:                Upper GI endoscopy Indications:              Suspected upper gastrointestinal bleeding in                            patient with unexplained iron deficiency anemia,                            Dysphagia, Nausea Providers:                Mauri Pole, MD, Burtis Junes, RN, Elspeth Cho Tech., Technician, Heide Scales, CRNA Referring MD:              Medicines:                Monitored Anesthesia Care Complications:            No immediate complications. Estimated Blood Loss:     Estimated blood loss was minimal. Procedure:                Pre-Anesthesia Assessment:                           - Prior to the procedure, a History and Physical                            was performed, and patient medications and                            allergies were reviewed. The patient's tolerance of                            previous anesthesia was also reviewed. The risks                            and benefits of the procedure and the sedation                            options and risks were discussed with the patient.                            All questions were answered, and informed consent                            was obtained. Prior Anticoagulants: The patient has                            taken no previous anticoagulant or antiplatelet  agents. ASA Grade Assessment: III - A patient with                            severe systemic disease. After reviewing the risks                            and benefits, the patient was deemed in                            satisfactory condition to undergo the procedure.                           After obtaining informed consent, the endoscope was                             passed under direct vision. Throughout the                            procedure, the patient's blood pressure, pulse, and                            oxygen saturations were monitored continuously. The                            GIF-H190 ([id number]) Olympus gastroscope was                            introduced through the mouth, and advanced to the                            second part of duodenum. The upper GI endoscopy was                            somewhat difficult due to stenosis. The patient                            tolerated the procedure well. Scope In: Scope Out: Findings:      LA Grade B (one or more mucosal breaks greater than 5 mm, not extending       between the tops of two mucosal folds) esophagitis with no bleeding was       found 34 to 35 cm from the incisors.      A small hiatal hernia was present.      Patchy mild inflammation with hemorrhage characterized by congestion       (edema), erosions and erythema was found in the entire examined stomach.       Biopsies were taken with a cold forceps for Helicobacter pylori testing.      One non-bleeding cratered gastric ulcer with a clean ulcer base (Forrest       Class III) was found at the pylorus. The lesion was 7 mm in largest       dimension. Biopsies were taken with a cold forceps for histology.      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 This was non-traversed. A  TTS dilator was passed through the scope. Dilation with an 07-25-09 mm       pyloric balloon dilator was performed. The dilation site was examined       following endoscope reinsertion and showed moderate improvement in       luminal narrowing.      A small infiltrative mass with no bleeding was found in the duodenal       bulb near pyloric channel. Biopsies were taken with a cold forceps for       histology.      The second portion of the duodenum was normal. Impression:               - LA Grade B reflux esophagitis with no bleeding.                            - Small hiatal hernia.                           - Gastritis with hemorrhage. Biopsied.                           - Non-bleeding gastric ulcer with a clean ulcer                            base (Forrest Class III). Biopsied.                           - Gastric stenosis was found at the pylorus.                            Dilated.                           - Rule out malignancy, duodenal mass. Biopsied.                           - Normal second portion of the duodenum. Moderate Sedation:      Not Applicable - Patient had care per Anesthesia. Recommendation:           - Soft diet.                           - Continue present medications.                           - Await pathology results.                           - Repeat upper endoscopy in 2 months to check                            healing.                           - Use Nexium (esomeprazole) 40 mg PO BID.                           - Use sucralfate  tablets 1 gram PO QID.                           - No ibuprofen, naproxen, or other non-steroidal                            anti-inflammatory drugs. Procedure Code(s):        --- Professional ---                           [phone], Esophagogastroduodenoscopy, flexible,                            transoral; with dilation of gastric/duodenal                            stricture(s) (eg, balloon, bougie)                           43239, 70, Esophagogastroduodenoscopy, flexible,                            transoral; with biopsy, single or multiple Diagnosis Code(s):        --- Professional ---                           K21.00, Gastro-esophageal reflux disease with                            esophagitis, without bleeding                           K44.9, Diaphragmatic hernia without obstruction or                            gangrene                           K29.71, Gastritis, unspecified, with bleeding                           K25.9, Gastric ulcer, unspecified as acute or                             chronic, without hemorrhage or perforation                           K31.1, Adult hypertrophic pyloric stenosis                           K31.89, Other diseases of stomach and duodenum                           D50.9, Iron deficiency anemia, unspecified                           R13.10, Dysphagia, unspecified  R11.0, Nausea CPT copyright 2019 American Medical Association. All rights reserved. The codes documented in this report are preliminary and upon coder review may  be revised to meet current compliance requirements. Mauri Pole, MD 07/04/2020 10:38:19 AM This report has been signed electronically. Number of Addenda: 0

## 2020-07-04 NOTE — Anesthesia Procedure Notes (Signed)
Procedure Name: MAC Date/Time: 07/04/2020 9:13 AM Performed by: Deliah Boston, CRNA Pre-anesthesia Checklist: Patient identified, Emergency Drugs available, Suction available and Patient being monitored Patient Re-evaluated:Patient Re-evaluated prior to induction Oxygen Delivery Method: Simple face mask Preoxygenation: Pre-oxygenation with 100% oxygen Placement Confirmation: positive ETCO2 and breath sounds checked- equal and bilateral

## 2020-07-04 NOTE — Anesthesia Postprocedure Evaluation (Signed)
Anesthesia Post Note  Patient: Morgan [name]  Procedure(s) Performed: ESOPHAGOGASTRODUODENOSCOPY (EGD) WITH PROPOFOL (N/A ) COLONOSCOPY WITH PROPOFOL (N/A ) BIOPSY BALLOON DILATION (N/A ) POLYPECTOMY     Patient location during evaluation: PACU Anesthesia Type: MAC Level of consciousness: awake and alert Pain management: pain level controlled Vital Signs Assessment: post-procedure vital signs reviewed and stable Respiratory status: spontaneous breathing, nonlabored ventilation and respiratory function stable Cardiovascular status: stable and blood pressure returned to baseline Postop Assessment: no apparent nausea or vomiting Anesthetic complications: no   No complications documented.  Last Vitals:  Vitals:   07/04/20 1030 07/04/20 1040  BP: (!) 184/93 (!) 188/87  Pulse: 60 60  Resp: (!) 21 15  Temp:    SpO2: 100% 99%    Last Pain:  Vitals:   07/04/20 1040  TempSrc:   PainSc: 0-No pain                 Theon Sobotka A.

## 2020-07-04 NOTE — Discharge Instructions (Signed)
YOU HAD AN ENDOSCOPIC PROCEDURE TODAY: Refer to the procedure report and other information in the discharge instructions given to you for any specific questions about what was found during the examination. If this information does not answer your questions, please call [place] office at [phone] to clarify.   YOU SHOULD EXPECT: Some feelings of bloating in the abdomen. Passage of more gas than usual. Walking can help get rid of the air that was put into your GI tract during the procedure and reduce the bloating. If you had a lower endoscopy (such as a colonoscopy or flexible sigmoidoscopy) you may notice spotting of blood in your stool or on the toilet paper. Some abdominal soreness may be present for a day or two, also.  DIET: Your first meal following the procedure should be a light meal and then it is ok to progress to your normal diet. A half-sandwich or bowl of soup is an example of a good first meal. Heavy or fried foods are harder to digest and may make you feel nauseous or bloated. Drink plenty of fluids but you should avoid alcoholic beverages for 24 hours. If you had a esophageal dilation, please see attached instructions for diet.    ACTIVITY: Your care partner should take you home directly after the procedure. You should plan to take it easy, moving slowly for the rest of the day. You can resume normal activity the day after the procedure however YOU SHOULD NOT DRIVE, use power tools, machinery or perform tasks that involve climbing or major physical exertion for 24 hours (because of the sedation medicines used during the test).   SYMPTOMS TO REPORT IMMEDIATELY: A gastroenterologist can be reached at any hour. Please call [phone]  for any of the following symptoms:   Following upper endoscopy (EGD, EUS, ERCP, esophageal dilation) Vomiting of blood or coffee ground material  New, significant abdominal pain  New, significant chest pain or pain under the shoulder blades  Painful or  persistently difficult swallowing  New shortness of breath  Black, tarry-looking or red, bloody stools  FOLLOW UP:  If any biopsies were taken you will be contacted by phone or by letter within the next 1-3 weeks. Call [phone]  if you have not heard about the biopsies in 3 weeks.  Please also call with any specific questions about appointments or follow up tests.  

## 2020-07-05 ENCOUNTER — Encounter (HOSPITAL_COMMUNITY): Payer: Self-pay | Admitting: Gastroenterology

## 2020-07-05 ENCOUNTER — Other Ambulatory Visit: Payer: Self-pay

## 2020-07-05 LAB — SURGICAL PATHOLOGY

## 2020-07-07 ENCOUNTER — Encounter: Payer: Self-pay | Admitting: Gastroenterology

## 2020-07-11 ENCOUNTER — Telehealth: Payer: Self-pay

## 2020-07-11 NOTE — Telephone Encounter (Signed)
-----   Message from Stevan Born, [place] sent at 05/04/2020  9:22 AM EDT ----- Regarding: August Appr Needs to see nandigam in August

## 2020-07-11 NOTE — Telephone Encounter (Signed)
Patient returned call and was informed that she will need 3 month follow up in October 2021.  Patient's questions regarding recent endoscopy/colonoscopy were answered per Dr Woodward Ku recommendations.  Staff message sent to myself to schedule patient in October.  Patient was also advised to call office in 1 - 2 weeks to see if schedule has been opened for her to schedule.  Patient agreed to plan and verbalized understanding. No further questions.

## 2020-07-11 NOTE — Telephone Encounter (Signed)
Left message for patient to return call to schedule 3 month follow up with Dr Silverio Decamp. Will continue efforts.

## 2020-07-20 ENCOUNTER — Other Ambulatory Visit: Payer: Self-pay | Admitting: Gastroenterology

## 2020-07-26 ENCOUNTER — Other Ambulatory Visit: Payer: Self-pay | Admitting: Gastroenterology

## 2020-07-29 ENCOUNTER — Telehealth: Payer: Self-pay

## 2020-07-29 NOTE — Telephone Encounter (Signed)
Agree with ER evaluation

## 2020-07-29 NOTE — Telephone Encounter (Signed)
[facility] - Client TELEPHONE ADVICE RECORD [facility] Patient Name: Morgan [name] Gender: Female DOB: 12-Sep-1951 Age: 69 Y 1 M 90 D Return Phone Number: [phone] (Primary) Address: City/State/Zip: [place] [address] Client [facility] - Client Client Site [place] - Day Physician [name]- MD Contact Type Call Who Is Calling Patient / Member / Family / Caregiver Call Type Triage / Clinical Relationship To Patient Self Return Phone Number [phone] (Primary) Chief Complaint Dizziness Reason for Call Symptomatic / Request for Health Information Initial Comment Has upset stomach, nausea and dizzy. Translation No Nurse Assessment Nurse: Loletha Carrow, RN, Ronalee Belts Date/Time (Eastern Time): 07/29/2020 10:50:51 AM Confirm and document reason for call. If symptomatic, describe symptoms. ---Caller States: X 2-3 days Has upset stomach, nausea and dizzy, diarrhea. has been taking phenergan. denies any other symptoms Has the patient had close contact with a person known or suspected to have the novel coronavirus illness OR traveled / lives in area with major community spread (including international travel) in the last 14 days from the onset of symptoms? * If Asymptomatic, screen for exposure and travel within the last 14 days. ---No Does the patient have any new or worsening symptoms? ---Yes Will a triage be completed? ---Yes Related visit to physician within the last 2 weeks? ---No Does the PT have any chronic conditions? (i.e. diabetes, asthma, this includes High risk factors for pregnancy, etc.) ---Yes List chronic conditions. ---HTN Is this a behavioral health or substance abuse call? ---No Guidelines Guideline Title Affirmed Question Affirmed Notes Nurse Date/Time (Eastern Time) Nausea [1] Drinking very little AND [2] dehydration suspected (e.g., no urine > 12 hours, very dry mouth, very  lightheaded) Emch, RN, Ronalee Belts 07/29/2020 10:52:57 AM Disp. Time Eilene Ghazi Time) Disposition Final User 07/29/2020 10:13:51 AM Attempt made - no message left Emch, RN, Ronalee Belts PLEASE NOTE: All timestamps contained within this report are represented as [place] Standard Time. CONFIDENTIALTY NOTICE: This fax transmission is intended only for the addressee. It contains information that is legally privileged, confidential or otherwise protected from use or disclosure. If you are not the intended recipient, you are strictly prohibited from reviewing, disclosing, copying using or disseminating any of this information or taking any action in reliance on or regarding this information. If you have received this fax in error, please notify us immediately by telephone so that we can arrange for its return to [place]. Phone: [phone], Toll-Free: [phone], Fax: [phone] Page: 2 of 2 Call Id: [id number] [place]. Time Eilene Ghazi Time) Disposition Final User 07/29/2020 10:41:58 AM Attempt made - no message left Emch, RN, Ronalee Belts 07/29/2020 10:55:56 AM Go to ED Now (or PCP triage) Yes Emch, RN, Vicenta Dunning Disagree/Comply Comply Caller Understands Yes PreDisposition Did not know what to do Care Advice Given Per Guideline GO TO ED NOW (OR PCP TRIAGE): * IF PCP SECOND-LEVEL TRIAGE REQUIRED: You may need to be seen. Your doctor (or NP/PA) will want to talk with you to decide what's best. I'll page the provider on-call now. If you haven't heard from the provider (or me) within 30 minutes, go directly to the Icard at _____________ [employer]: * Please bring a list of your current medicines when you go to the Emergency Department (ER). CARE ADVICE given per Nausea (Adult) guideline. Comments User: Doretha Sou, RN Date/Time Eilene Ghazi Time): 07/29/2020 10:23:09 AM Attempted to reach this caller X 2 attempts and the line does not ring. I will transfer this call to another  RN for the final attempt User: Doretha Sou, RN  Date/Time Eilene Ghazi Time): 07/29/2020 10:54:22 AM very poor cell connection Referrals GO TO FACILITY UNDECIDED

## 2020-07-29 NOTE — Telephone Encounter (Signed)
Pt called back; pt has been taking phenergan for nausea; pt has been nauseated for 2 months, has had watery diarrhea on and off for 2 months; pt has had dizziness and ringing in ears for a while off and on but dizziness to the point pt feels like she is going to pass out. No way to ck BP. Pt will go to [facility] ED now. FYI to Dr Einar Pheasant.

## 2020-07-29 NOTE — Telephone Encounter (Signed)
Unable to reach pt by phone FYI to [facility] and [facility] LPN.

## 2020-08-09 ENCOUNTER — Encounter (HOSPITAL_COMMUNITY): Payer: Self-pay

## 2020-08-09 ENCOUNTER — Telehealth: Payer: Self-pay | Admitting: Nurse Practitioner

## 2020-08-09 ENCOUNTER — Other Ambulatory Visit: Payer: Self-pay

## 2020-08-09 ENCOUNTER — Emergency Department (HOSPITAL_COMMUNITY): Payer: Medicare HMO

## 2020-08-09 ENCOUNTER — Inpatient Hospital Stay (HOSPITAL_COMMUNITY)
Admission: EM | Admit: 2020-08-09 | Discharge: 2020-08-18 | DRG: 392 | Disposition: A | Discharge status: Home / Self Care | Payer: Medicare HMO | Attending: Student | Admitting: Student

## 2020-08-09 DIAGNOSIS — Z20822 Contact with and (suspected) exposure to covid-19: Secondary | ICD-10-CM | POA: Diagnosis present

## 2020-08-09 DIAGNOSIS — B9620 Unspecified Escherichia coli [E. coli] as the cause of diseases classified elsewhere: Secondary | ICD-10-CM | POA: Diagnosis present

## 2020-08-09 DIAGNOSIS — D631 Anemia in chronic kidney disease: Secondary | ICD-10-CM | POA: Diagnosis present

## 2020-08-09 DIAGNOSIS — K311 Adult hypertrophic pyloric stenosis: Secondary | ICD-10-CM | POA: Diagnosis present

## 2020-08-09 DIAGNOSIS — Z87891 Personal history of nicotine dependence: Secondary | ICD-10-CM

## 2020-08-09 DIAGNOSIS — D509 Iron deficiency anemia, unspecified: Secondary | ICD-10-CM | POA: Diagnosis present

## 2020-08-09 DIAGNOSIS — Z9049 Acquired absence of other specified parts of digestive tract: Secondary | ICD-10-CM | POA: Diagnosis not present

## 2020-08-09 DIAGNOSIS — A084 Viral intestinal infection, unspecified: Principal | ICD-10-CM | POA: Diagnosis present

## 2020-08-09 DIAGNOSIS — J449 Chronic obstructive pulmonary disease, unspecified: Secondary | ICD-10-CM | POA: Diagnosis present

## 2020-08-09 DIAGNOSIS — I130 Hypertensive heart and chronic kidney disease with heart failure and stage 1 through stage 4 chronic kidney disease, or unspecified chronic kidney disease: Secondary | ICD-10-CM | POA: Diagnosis present

## 2020-08-09 DIAGNOSIS — R1010 Upper abdominal pain, unspecified: Secondary | ICD-10-CM | POA: Diagnosis not present

## 2020-08-09 DIAGNOSIS — I5032 Chronic diastolic (congestive) heart failure: Secondary | ICD-10-CM | POA: Diagnosis present

## 2020-08-09 DIAGNOSIS — Z8673 Personal history of transient ischemic attack (TIA), and cerebral infarction without residual deficits: Secondary | ICD-10-CM | POA: Diagnosis not present

## 2020-08-09 DIAGNOSIS — Z803 Family history of malignant neoplasm of breast: Secondary | ICD-10-CM

## 2020-08-09 DIAGNOSIS — E876 Hypokalemia: Secondary | ICD-10-CM | POA: Diagnosis present

## 2020-08-09 DIAGNOSIS — K219 Gastro-esophageal reflux disease without esophagitis: Secondary | ICD-10-CM | POA: Diagnosis not present

## 2020-08-09 DIAGNOSIS — N183 Chronic kidney disease, stage 3 unspecified: Secondary | ICD-10-CM | POA: Diagnosis present

## 2020-08-09 DIAGNOSIS — J9611 Chronic respiratory failure with hypoxia: Secondary | ICD-10-CM | POA: Diagnosis present

## 2020-08-09 DIAGNOSIS — Z833 Family history of diabetes mellitus: Secondary | ICD-10-CM

## 2020-08-09 DIAGNOSIS — E669 Obesity, unspecified: Secondary | ICD-10-CM | POA: Diagnosis present

## 2020-08-09 DIAGNOSIS — R197 Diarrhea, unspecified: Secondary | ICD-10-CM | POA: Diagnosis not present

## 2020-08-09 DIAGNOSIS — Z79899 Other long term (current) drug therapy: Secondary | ICD-10-CM

## 2020-08-09 DIAGNOSIS — E785 Hyperlipidemia, unspecified: Secondary | ICD-10-CM | POA: Diagnosis present

## 2020-08-09 DIAGNOSIS — Z83438 Family history of other disorder of lipoprotein metabolism and other lipidemia: Secondary | ICD-10-CM

## 2020-08-09 DIAGNOSIS — K257 Chronic gastric ulcer without hemorrhage or perforation: Secondary | ICD-10-CM | POA: Diagnosis not present

## 2020-08-09 DIAGNOSIS — Z91018 Allergy to other foods: Secondary | ICD-10-CM

## 2020-08-09 DIAGNOSIS — Z1612 Extended spectrum beta lactamase (ESBL) resistance: Secondary | ICD-10-CM | POA: Diagnosis present

## 2020-08-09 DIAGNOSIS — K580 Irritable bowel syndrome with diarrhea: Secondary | ICD-10-CM | POA: Diagnosis present

## 2020-08-09 DIAGNOSIS — Z8041 Family history of malignant neoplasm of ovary: Secondary | ICD-10-CM | POA: Diagnosis not present

## 2020-08-09 DIAGNOSIS — N390 Urinary tract infection, site not specified: Secondary | ICD-10-CM | POA: Diagnosis present

## 2020-08-09 DIAGNOSIS — R933 Abnormal findings on diagnostic imaging of other parts of digestive tract: Secondary | ICD-10-CM | POA: Diagnosis not present

## 2020-08-09 DIAGNOSIS — G894 Chronic pain syndrome: Secondary | ICD-10-CM | POA: Diagnosis present

## 2020-08-09 DIAGNOSIS — Z6832 Body mass index (BMI) 32.0-32.9, adult: Secondary | ICD-10-CM

## 2020-08-09 DIAGNOSIS — N182 Chronic kidney disease, stage 2 (mild): Secondary | ICD-10-CM | POA: Diagnosis present

## 2020-08-09 DIAGNOSIS — Z808 Family history of malignant neoplasm of other organs or systems: Secondary | ICD-10-CM

## 2020-08-09 DIAGNOSIS — Z7982 Long term (current) use of aspirin: Secondary | ICD-10-CM

## 2020-08-09 DIAGNOSIS — K56609 Unspecified intestinal obstruction, unspecified as to partial versus complete obstruction: Secondary | ICD-10-CM | POA: Diagnosis not present

## 2020-08-09 DIAGNOSIS — G8929 Other chronic pain: Secondary | ICD-10-CM | POA: Diagnosis present

## 2020-08-09 DIAGNOSIS — R112 Nausea with vomiting, unspecified: Secondary | ICD-10-CM | POA: Diagnosis not present

## 2020-08-09 DIAGNOSIS — I10 Essential (primary) hypertension: Secondary | ICD-10-CM

## 2020-08-09 DIAGNOSIS — R1084 Generalized abdominal pain: Secondary | ICD-10-CM | POA: Diagnosis not present

## 2020-08-09 DIAGNOSIS — Z888 Allergy status to other drugs, medicaments and biological substances status: Secondary | ICD-10-CM

## 2020-08-09 DIAGNOSIS — K56600 Partial intestinal obstruction, unspecified as to cause: Secondary | ICD-10-CM | POA: Diagnosis present

## 2020-08-09 DIAGNOSIS — Z9981 Dependence on supplemental oxygen: Secondary | ICD-10-CM | POA: Diagnosis not present

## 2020-08-09 DIAGNOSIS — E860 Dehydration: Secondary | ICD-10-CM | POA: Diagnosis present

## 2020-08-09 DIAGNOSIS — Z86010 Personal history of colonic polyps: Secondary | ICD-10-CM | POA: Diagnosis not present

## 2020-08-09 DIAGNOSIS — Z91038 Other insect allergy status: Secondary | ICD-10-CM

## 2020-08-09 DIAGNOSIS — R1032 Left lower quadrant pain: Secondary | ICD-10-CM | POA: Diagnosis not present

## 2020-08-09 DIAGNOSIS — Z8249 Family history of ischemic heart disease and other diseases of the circulatory system: Secondary | ICD-10-CM

## 2020-08-09 LAB — URINALYSIS, ROUTINE W REFLEX MICROSCOPIC
Bilirubin Urine: NEGATIVE
Glucose, UA: NEGATIVE mg/dL
Ketones, ur: 5 mg/dL — AB
Nitrite: POSITIVE — AB
Protein, ur: 30 mg/dL — AB
Specific Gravity, Urine: 1.028 (ref 1.005–1.030)
WBC, UA: >50 WBC/hpf — ABNORMAL HIGH (ref 0–5)
pH: 5 (ref 5.0–8.0)

## 2020-08-09 LAB — CBC
HCT: 44 % (ref 36.0–46.0)
Hemoglobin: 12.3 g/dL (ref 12.0–15.0)
MCH: 24.8 pg — ABNORMAL LOW (ref 26.0–34.0)
MCHC: 28 g/dL — ABNORMAL LOW (ref 30.0–36.0)
MCV: 88.7 fL (ref 80.0–100.0)
Platelets: 215 10*3/uL (ref 150–400)
RBC: 4.96 MIL/uL (ref 3.87–5.11)
RDW: 16.1 % — ABNORMAL HIGH (ref 11.5–15.5)
WBC: 12.1 10*3/uL — ABNORMAL HIGH (ref 4.0–10.5)
nRBC: 0 % (ref 0.0–0.2)

## 2020-08-09 LAB — COMPREHENSIVE METABOLIC PANEL
ALT: 9 U/L (ref 0–44)
AST: 20 U/L (ref 15–41)
Albumin: 3.3 g/dL — ABNORMAL LOW (ref 3.5–5.0)
Alkaline Phosphatase: 119 U/L (ref 38–126)
Anion gap: 16 — ABNORMAL HIGH (ref 5–15)
BUN: 22 mg/dL (ref 8–23)
CO2: 22 mmol/L (ref 22–32)
Calcium: 8.8 mg/dL — ABNORMAL LOW (ref 8.9–10.3)
Chloride: 103 mmol/L (ref 98–111)
Creatinine, Ser: 0.84 mg/dL (ref 0.44–1.00)
GFR calc Af Amer: >60 mL/min (ref >60)
GFR calc non Af Amer: >60 mL/min (ref >60)
Glucose, Bld: 107 mg/dL — ABNORMAL HIGH (ref 70–99)
Potassium: 3.6 mmol/L (ref 3.5–5.1)
Sodium: 141 mmol/L (ref 135–145)
Total Bilirubin: 0.4 mg/dL (ref 0.3–1.2)
Total Protein: 6.6 g/dL (ref 6.5–8.1)

## 2020-08-09 LAB — LIPASE, BLOOD: Lipase: 18 U/L (ref 11–51)

## 2020-08-09 LAB — SARS CORONAVIRUS 2 BY RT PCR (HOSPITAL ORDER, PERFORMED IN ~~LOC~~ HOSPITAL LAB): SARS Coronavirus 2: NEGATIVE

## 2020-08-09 MED ORDER — IOHEXOL 300 MG/ML  SOLN
100.0000 mL | Freq: Once | INTRAMUSCULAR | Status: AC | PRN
  Administered 2020-08-09: 100 mL via INTRAVENOUS

## 2020-08-09 MED ORDER — ONDANSETRON HCL 4 MG/2ML IJ SOLN
4.0000 mg | Freq: Four times a day (QID) | INTRAMUSCULAR | Status: DC | PRN
  Administered 2020-08-10 – 2020-08-13 (×10): 4 mg via INTRAVENOUS
  Filled 2020-08-09 (×10): qty 2

## 2020-08-09 MED ORDER — DIPHENHYDRAMINE HCL 50 MG/ML IJ SOLN
25.0000 mg | Freq: Once | INTRAMUSCULAR | Status: AC
  Administered 2020-08-09: 25 mg via INTRAVENOUS
  Filled 2020-08-09: qty 1

## 2020-08-09 MED ORDER — SODIUM CHLORIDE 0.9 % IV BOLUS
1000.0000 mL | Freq: Once | INTRAVENOUS | Status: AC
  Administered 2020-08-09: 1000 mL via INTRAVENOUS

## 2020-08-09 MED ORDER — SODIUM CHLORIDE 0.9% FLUSH
3.0000 mL | Freq: Two times a day (BID) | INTRAVENOUS | Status: DC
  Administered 2020-08-10 – 2020-08-17 (×7): 3 mL via INTRAVENOUS

## 2020-08-09 MED ORDER — ATORVASTATIN CALCIUM 40 MG PO TABS
40.0000 mg | ORAL_TABLET | Freq: Every day | ORAL | Status: DC
  Administered 2020-08-10 – 2020-08-18 (×9): 40 mg via ORAL
  Filled 2020-08-09 (×9): qty 1

## 2020-08-09 MED ORDER — MORPHINE SULFATE (PF) 4 MG/ML IV SOLN
4.0000 mg | Freq: Once | INTRAVENOUS | Status: AC
  Administered 2020-08-09: 4 mg via INTRAVENOUS
  Filled 2020-08-09: qty 1

## 2020-08-09 MED ORDER — PANTOPRAZOLE SODIUM 40 MG IV SOLR
40.0000 mg | Freq: Every day | INTRAVENOUS | Status: DC
  Administered 2020-08-09 – 2020-08-11 (×3): 40 mg via INTRAVENOUS
  Filled 2020-08-09 (×3): qty 40

## 2020-08-09 MED ORDER — ONDANSETRON HCL 4 MG/2ML IJ SOLN
4.0000 mg | Freq: Once | INTRAMUSCULAR | Status: AC
  Administered 2020-08-09: 4 mg via INTRAVENOUS
  Filled 2020-08-09: qty 2

## 2020-08-09 MED ORDER — METOCLOPRAMIDE HCL 5 MG/ML IJ SOLN
10.0000 mg | Freq: Once | INTRAMUSCULAR | Status: AC
  Administered 2020-08-09: 10 mg via INTRAVENOUS
  Filled 2020-08-09: qty 2

## 2020-08-09 MED ORDER — SODIUM CHLORIDE 0.9 % IV SOLN
2.0000 g | INTRAVENOUS | Status: DC
  Administered 2020-08-10 – 2020-08-13 (×4): 2 g via INTRAVENOUS
  Filled 2020-08-09: qty 2
  Filled 2020-08-09: qty 20
  Filled 2020-08-09 (×2): qty 2

## 2020-08-09 MED ORDER — POTASSIUM CHLORIDE 10 MEQ/100ML IV SOLN
10.0000 meq | INTRAVENOUS | Status: AC
  Administered 2020-08-09 – 2020-08-10 (×2): 10 meq via INTRAVENOUS
  Filled 2020-08-09 (×2): qty 100

## 2020-08-09 MED ORDER — SODIUM CHLORIDE 0.9 % IV SOLN: INTRAVENOUS | Status: AC

## 2020-08-09 MED ORDER — ONDANSETRON HCL 4 MG PO TABS: 4.0000 mg | ORAL_TABLET | Freq: Four times a day (QID) | ORAL | Status: DC | PRN

## 2020-08-09 MED ORDER — ALBUTEROL SULFATE (2.5 MG/3ML) 0.083% IN NEBU: 2.5000 mg | INHALATION_SOLUTION | RESPIRATORY_TRACT | Status: DC | PRN

## 2020-08-09 MED ORDER — MORPHINE SULFATE (PF) 2 MG/ML IV SOLN
2.0000 mg | INTRAVENOUS | Status: DC | PRN
  Administered 2020-08-09 – 2020-08-13 (×16): 2 mg via INTRAVENOUS
  Filled 2020-08-09 (×17): qty 1

## 2020-08-09 MED ORDER — SODIUM CHLORIDE 0.9 % IV SOLN
1.0000 g | Freq: Once | INTRAVENOUS | Status: AC
  Administered 2020-08-09: 1 g via INTRAVENOUS
  Filled 2020-08-09: qty 10

## 2020-08-09 MED ORDER — MORPHINE SULFATE (PF) 2 MG/ML IV SOLN: 2.0000 mg | INTRAVENOUS | Status: DC | PRN

## 2020-08-09 NOTE — H&P (Signed)
Morgan [name] YKZ:[id number] DOB: 04/14/1951 DOA: 08/09/2020     PCP: [name], MD   Outpatient Specialists:   CARDS:   At [place] ( [facility])    Patient arrived to ER on 08/09/20 at 0634 Referred by Attending Drenda Freeze, MD   Patient coming from: home Lives   With roomates    Chief Complaint:   Chief Complaint  Patient presents with  . Abdominal Pain    HPI: Morgan [name] is a 69 y.o. female with medical history significant of COPD on 2L , CHF, CKD, iron deficiency,     Presented with she has been having significant nausea and have hard time drinking she have   Acute worsening of her abdominal pain  Decreased Urine output   Since her EGD colonoscopy patient have had some nausea and diarrhea Recently had diagnostic EGD and colonoscopy showed pyloric stenosis she underwent dilatation   Infectious risk factors:  Reports  N/V/Diarrhea/abdominal pain,    Has been vaccinated against COVID    Initial COVID TEST  NEGATIVE   Lab Results  Component Value Date   [place] 08/09/2020   [place] NEGATIVE 06/29/2020   [place] NEGATIVE 05/17/2020   [place] NEGATIVE 04/18/2020     Regarding pertinent Chronic problems:      HTN on not on any meds   chronic CHF diastolic    - last echo 9390 preserved EF       COPD - not followed by pulmonology    on baseline oxygen  2L,        Hx of CVA 2017-  with/out residual deficits on Aspirin 81 mg,      Chronic anemia - baseline hg Hemoglobin & Hematocrit  Recent Labs    05/19/20 0544 05/20/20 0522 08/09/20 1144  HGB 9.6* 9.3* 12.3   While in ER: Found to have UTI Lipase 18 CT showing partial SBO vs enteritis Rocephin   ER Provider Called:  General surgery   Dr. Donne Hazel They Recommend admit to medicine   Will see in AM   ER Provider Called:  [facility] Dr. Elita Boone They Recommend admit to medicine  Place NG if starts to vomit Will see in AM   Hospitalist was called  for admission for SBO  The following Work up has been ordered so far:  Orders Placed This Encounter  Procedures  . SARS Coronavirus 2 by RT PCR (hospital order, performed in [facility] hospital lab) Nasopharyngeal Nasopharyngeal Swab  . CT ABDOMEN PELVIS W CONTRAST  . Lipase, blood  . Comprehensive metabolic panel  . CBC  . Urinalysis, Routine w reflex microscopic  . Diet NPO time specified  . Consult to general surgery  ALL PATIENTS BEING ADMITTED/HAVING PROCEDURES NEED COVID-19 SCREENING  . Consult to hospitalist  ALL PATIENTS BEING ADMITTED/HAVING PROCEDURES NEED COVID-19 SCREENING    Following Medications were ordered in ER: Medications  cefTRIAXone (ROCEPHIN) 1 g in sodium chloride 0.9 % 100 mL IVPB (has no administration in time range)  sodium chloride 0.9 % bolus 1,000 mL (1,000 mLs Intravenous New Bag/Given 08/09/20 1910)  ondansetron (ZOFRAN) injection 4 mg (4 mg Intravenous Given 08/09/20 1909)  morphine 4 MG/ML injection 4 mg (4 mg Intravenous Given 08/09/20 1909)  metoCLOPramide (REGLAN) injection 10 mg (10 mg Intravenous Given 08/09/20 1909)  diphenhydrAMINE (BENADRYL) injection 25 mg (25 mg Intravenous Given 08/09/20 1910)  iohexol (OMNIPAQUE) 300 MG/ML solution 100 mL (100 mLs Intravenous Contrast Given 08/09/20 1926)  Consult Orders  (From admission, onward)         Start     Ordered   08/09/20 2029  Consult to hospitalist  ALL PATIENTS BEING ADMITTED/HAVING PROCEDURES NEED COVID-19 SCREENING  Once       Comments: ALL PATIENTS BEING ADMITTED/HAVING PROCEDURES NEED COVID-19 SCREENING  Provider:  (Not yet assigned)  Question Answer Comment  Place call to: [facility]   Reason for Consult Admit      08/09/20 2028           Significant initial  Findings: Abnormal Labs Reviewed  COMPREHENSIVE METABOLIC PANEL - Abnormal; Notable for the following components:      Result Value   Glucose, Bld 107 (*)    Calcium 8.8 (*)    Albumin 3.3 (*)    Anion  gap 16 (*)    All other components within normal limits  CBC - Abnormal; Notable for the following components:   WBC 12.1 (*)    MCH 24.8 (*)    MCHC 28.0 (*)    RDW 16.1 (*)    All other components within normal limits  URINALYSIS, ROUTINE W REFLEX MICROSCOPIC - Abnormal; Notable for the following components:   Color, Urine AMBER (*)    APPearance CLOUDY (*)    Hgb urine dipstick MODERATE (*)    Ketones, ur 5 (*)    Protein, ur 30 (*)    Nitrite POSITIVE (*)    Leukocytes,Ua LARGE (*)    WBC, UA >50 (*)    Bacteria, UA MANY (*)    All other components within normal limits     Otherwise labs showing:    Recent Labs  Lab 08/09/20 1144  NA 141  K 3.6  CO2 22  GLUCOSE 107*  BUN 22  CREATININE 0.84  CALCIUM 8.8*    Cr  Stable,  Lab Results  Component Value Date   CREATININE 0.84 08/09/2020   CREATININE 0.89 05/20/2020   CREATININE 0.85 05/19/2020    Recent Labs  Lab 08/09/20 1144  AST 20  ALT 9  ALKPHOS 119  BILITOT 0.4  PROT 6.6  ALBUMIN 3.3*   Lab Results  Component Value Date   CALCIUM 8.8 (L) 08/09/2020   PHOS 3.3 05/18/2020     WBC      Component Value Date/Time   WBC 12.1 (H) 08/09/2020 1144   ANC    Component Value Date/Time   NEUTROABS 5.9 10/20/2019 1523   ALC No components found for: LYMPHAB    Plt: Lab Results  Component Value Date   PLT 215 08/09/2020        COVID-19 Labs  No results for input(s): DDIMER, FERRITIN, LDH, CRP in the last 72 hours.  Lab Results  Component Value Date   SARSCOV2NAA NEGATIVE 06/29/2020   [place] NEGATIVE 05/17/2020   [place] NEGATIVE 04/18/2020    HG/HCT   stable,      Component Value Date/Time   HGB 12.3 08/09/2020 1144   HGB 12.4 09/18/2012 2000   HCT 44.0 08/09/2020 1144   HCT 37.3 09/18/2012 2000    Recent Labs  Lab 08/09/20 1144  LIPASE 18   No results for input(s): AMMONIA in the last 168 hours.      ECG: not Ordered     UA   evidence of UTI      Urine  analysis:    Component Value Date/Time   COLORURINE AMBER (A) 08/09/2020 1842   APPEARANCEUR CLOUDY (A) 08/09/2020 1842  APPEARANCEUR Cloudy 09/18/2012 2000   LABSPEC 1.028 08/09/2020 1842   LABSPEC 1.012 09/18/2012 2000   PHURINE 5.0 08/09/2020 1842   GLUCOSEU NEGATIVE 08/09/2020 1842   GLUCOSEU Negative 09/18/2012 2000   HGBUR MODERATE (A) 08/09/2020 1842   BILIRUBINUR NEGATIVE 08/09/2020 1842   BILIRUBINUR negative 04/08/2019 1103   BILIRUBINUR Negative 09/18/2012 2000   KETONESUR 5 (A) 08/09/2020 1842   PROTEINUR 30 (A) 08/09/2020 1842   UROBILINOGEN 0.2 04/08/2019 1103   UROBILINOGEN 0.2 05/25/2017 1743   NITRITE POSITIVE (A) 08/09/2020 1842   LEUKOCYTESUR LARGE (A) 08/09/2020 1842   LEUKOCYTESUR Trace 09/18/2012 2000     Urine cult  Ordered   CTabd/pelvis - partial SBO       ED Triage Vitals  Enc Vitals Group     BP 08/09/20 0647 (!) 152/79     Pulse Rate 08/09/20 0647 85     Resp 08/09/20 0647 17     Temp 08/09/20 0647 99.4 F (37.4 C)     Temp Source 08/09/20 0647 Oral     SpO2 08/09/20 0643 99 %     Weight 08/09/20 0647 243 lb (110.2 kg)     Height 08/09/20 0647 6\' 1"  (1.854 m)     Head Circumference --      Peak Flow --      Pain Score 08/09/20 0647 10     Pain Loc --      Pain Edu? --      Excl. in [place]? --   TMAX(24)@       Latest  Blood pressure (!) 161/88, pulse 75, temperature 97.6 F (36.4 C), temperature source Oral, resp. rate 16, height 6\' 1"  (1.854 m), weight 110.2 kg, SpO2 97 %.     Review of Systems:    Pertinent positives include:  abdominal pain, nausea, vomiting,  Constitutional:  No weight loss, night sweats, Fevers, chills, fatigue, weight loss  HEENT:  No headaches, Difficulty swallowing,Tooth/dental problems,Sore throat,  No sneezing, itching, ear ache, nasal congestion, post nasal drip,  Cardio-vascular:  No chest pain, Orthopnea, PND, anasarca, dizziness, palpitations.no Bilateral lower extremity swelling  GI:  No  heartburn, indigestion,  diarrhea, change in bowel habits, loss of appetite, melena, blood in stool, hematemesis Resp:  no shortness of breath at rest. No dyspnea on exertion, No excess mucus, no productive cough, No non-productive cough, No coughing up of blood.No change in color of mucus.No wheezing. Skin:  no rash or lesions. No jaundice GU:  no dysuria, change in color of urine, no urgency or frequency. No straining to urinate.  No flank pain.  Musculoskeletal:  No joint pain or no joint swelling. No decreased range of motion. No back pain.  Psych:  No change in mood or affect. No depression or anxiety. No memory loss.  Neuro: no localizing neurological complaints, no tingling, no weakness, no double vision, no gait abnormality, no slurred speech, no confusion  All systems reviewed and apart from [place] all are negative  Past Medical History:   Past Medical History:  Diagnosis Date  . Allergy   . Anemia   . Anxiety   . Arthritis   . Depression   . GERD (gastroesophageal reflux disease)   . Hypertension   . MVP (mitral valve prolapse)       Past Surgical History:  Procedure Laterality Date  . ABDOMINAL HYSTERECTOMY    . APPENDECTOMY    . BALLOON DILATION N/A 04/21/2020   Procedure: BALLOON DILATION;  Surgeon: Milus Banister,  MD;  Location: [facility];  Service: Endoscopy;  Laterality: N/A;  pyloric/ GI  . BALLOON DILATION N/A 07/04/2020   Procedure: BALLOON DILATION;  Surgeon: Mauri Pole, MD;  Location: [facility];  Service: Endoscopy;  Laterality: N/A;  . BIOPSY  04/21/2020   Procedure: BIOPSY;  Surgeon: Milus Banister, MD;  Location: [facility];  Service: Endoscopy;;  . BIOPSY  07/04/2020   Procedure: BIOPSY;  Surgeon: Mauri Pole, MD;  Location: [facility];  Service: Endoscopy;;  EGD and COLON  . BREAST SURGERY Right 1988   tumor removal  . CHOLECYSTECTOMY N/A 05/18/2020   Procedure: LAPAROSCOPIC CHOLECYSTECTOMY;  Surgeon: Johnathan Hausen, MD;   Location: [facility];  Service: General;  Laterality: N/A;  . COLONOSCOPY WITH PROPOFOL N/A 07/04/2020   Procedure: COLONOSCOPY WITH PROPOFOL;  Surgeon: Mauri Pole, MD;  Location: [facility];  Service: Endoscopy;  Laterality: N/A;  . ESOPHAGOGASTRODUODENOSCOPY (EGD) WITH PROPOFOL N/A 04/21/2020   Procedure: ESOPHAGOGASTRODUODENOSCOPY (EGD) WITH PROPOFOL;  Surgeon: Milus Banister, MD;  Location: [facility];  Service: Endoscopy;  Laterality: N/A;  . ESOPHAGOGASTRODUODENOSCOPY (EGD) WITH PROPOFOL N/A 07/04/2020   Procedure: ESOPHAGOGASTRODUODENOSCOPY (EGD) WITH PROPOFOL;  Surgeon: Mauri Pole, MD;  Location: [facility];  Service: Endoscopy;  Laterality: N/A;  . KNEE SURGERY  2005   2 times left  . POLYPECTOMY  07/04/2020   Procedure: POLYPECTOMY;  Surgeon: Mauri Pole, MD;  Location: [facility];  Service: Endoscopy;;  . TUBAL LIGATION      Social History:  Ambulatory   Independently      reports that she quit smoking about 2 years ago. Her smoking use included cigarettes. She has a 15.00 pack-year smoking history. She has never used smokeless tobacco. She reports previous alcohol use. She reports that she does not use drugs.    Family History:   Family History  Problem Relation Age of Onset  . Breast cancer Mother 45  . Ovarian cancer Mother 76  . Brain cancer Mother   . Hypertension Father   . Hyperlipidemia Father   . Heart disease Father   . Breast cancer Maternal Grandmother 9  . Diabetes Paternal Grandmother   . Breast cancer Paternal Grandmother 71  . Cancer Paternal Grandfather   . Breast cancer Maternal Aunt 60  . Breast cancer Maternal Aunt 60    Allergies: Allergies  Allergen Reactions  . Meperidine Hives and Anaphylaxis  . Strawberry Extract Swelling  . Strawberry (Diagnostic) Hives  . Wasp Venom Hives  . Wasp Venom Protein      Prior to Admission medications   Medication Sig Start Date End Date Taking? Authorizing Provider    albuterol (VENTOLIN HFA) 108 (90 Base) MCG/ACT inhaler TAKE 2 PUFFS BY MOUTH EVERY 6 HOURS AS NEEDED FOR WHEEZE OR SHORTNESS OF BREATH Patient not taking: Reported on 06/21/2020 02/22/20   [name], MD  aspirin 81 MG EC tablet Take 81 mg by mouth daily. daily    [provider]  aspirin-acetaminophen-caffeine (EXCEDRIN MIGRAINE) [phone] MG tablet Take 2 tablets by mouth every 6 (six) hours as needed for headache.    [provider]  atorvastatin (LIPITOR) 40 MG tablet Take 40 mg by mouth daily.     [provider]  carvedilol (COREG) 3.125 MG tablet Take 3.125 mg by mouth in the morning and at bedtime.    [provider]  colestipol (COLESTID) 1 g tablet TAKE 1 TABLET (1 G TOTAL) BY MOUTH 2 (TWO) TIMES DAILY. 07/20/19  Mauri Pole, MD  DEXILANT 60 MG capsule TAKE 1 CAPSULE BY MOUTH EVERY DAY 07/20/20   Mauri Pole, MD  esomeprazole (NEXIUM) 40 MG capsule Take 1 capsule by mouth twice daily. Patient taking differently: Take 40 mg by mouth 2 (two) times daily.  03/30/20   Esterwood, Amy S, PA-C  Ferrous Sulfate (IRON) 325 (65 Fe) MG TABS Take 1 tablet (325 mg total) by mouth daily. 04/09/19   [name], MD  FLUoxetine (PROZAC) 40 MG capsule Take 40 mg by mouth daily.    [provider]  furosemide (LASIX) 40 MG tablet HOLD UNTIL seen by your doctor Patient taking differently: Take 40 mg by mouth daily.  02/19/19   Oretha Milch D, MD  gabapentin (NEURONTIN) 400 MG capsule Take 400 mg by mouth 3 (three) times daily.  10/20/18   [provider]  hydrOXYzine (ATARAX/VISTARIL) 25 MG tablet Take 12.5-25 mg by mouth 3 (three) times daily as needed for anxiety.    [provider]  linaclotide Rolan Lipa) 145 MCG CAPS capsule Take 1 capsule (145 mcg total) by mouth daily before breakfast. 05/04/20   Nandigam, Venia Minks, MD  mirtazapine (REMERON) 45 MG tablet Take 45 mg by mouth at bedtime.     [provider]   ondansetron (ZOFRAN-ODT) 4 MG disintegrating tablet Take 1 tablet (4 mg total) by mouth every 8 (eight) hours as needed for nausea or vomiting. 06/30/20   Willia Craze, NP  OXYGEN Inhale into the lungs. 02 at bedtime    [provider]  sucralfate (CARAFATE) 1 g tablet TAKE 1 TABLET (1 G TOTAL) BY MOUTH 4 (FOUR) TIMES DAILY - WITH MEALS AND AT BEDTIME. 07/26/20   Mauri Pole, MD   Physical Exam: Vitals with BMI 08/09/2020 08/09/2020 08/09/2020  Height - - -  Weight - - -  BMI - - -  Systolic 371 696 789  Diastolic 88 381 88  Pulse 75 71 73     1. General:  in No Acute distress   Chronically ill -appearing 2. Psychological: Alert and  Oriented 3. Head/ENT:      Dry Mucous Membranes                          Head Non traumatic, neck supple                           Poor Dentition 4. SKIN:  decreased Skin turgor,  Skin clean Dry and intact no rash 5. Heart: Regular rate and rhythm no  Murmur, no Rub or gallop 6. Lungs:  no wheezes or crackles   7. Abdomen: Soft,  non-tender, Non distended bowel sounds present 8. Lower extremities: no clubbing, cyanosis, no  edema 9. Neurologically Grossly intact, moving all 4 extremities equally   10. MSK: Normal range of motion   All other LABS:     Recent Labs  Lab 08/09/20 1144  WBC 12.1*  HGB 12.3  HCT 44.0  MCV 88.7  PLT 215     Recent Labs  Lab 08/09/20 1144  NA 141  K 3.6  CL 103  CO2 22  GLUCOSE 107*  BUN 22  CREATININE 0.84  CALCIUM 8.8*     Recent Labs  Lab 08/09/20 1144  AST 20  ALT 9  ALKPHOS 119  BILITOT 0.4  PROT 6.6  ALBUMIN 3.3*       Cultures:  Component Value Date/Time   SDES  05/16/2020 2100    URINE, CLEAN CATCH Performed at [facility], [place] [address] [place], [place] [address]    [place]  05/16/2020 2100    NONE Performed at [facility], [place] [address], [place], [place] [address]    CULT (A) 05/16/2020 2100    >=100,000  COLONIES/mL ESCHERICHIA COLI Confirmed Extended Spectrum Beta-Lactamase Producer (ESBL).  In bloodstream infections from ESBL organisms, carbapenems are preferred over piperacillin/tazobactam. They are shown to have a lower risk of mortality.    REPTSTATUS 05/19/2020 FINAL 05/16/2020 2100     Radiological Exams on Admission: CT ABDOMEN PELVIS W CONTRAST  Result Date: 08/09/2020 CLINICAL DATA:  Abdominal distension EXAM: CT ABDOMEN AND PELVIS WITH CONTRAST TECHNIQUE: Multidetector CT imaging of the abdomen and pelvis was performed using the standard protocol following bolus administration of intravenous contrast. CONTRAST:  161mL OMNIPAQUE IOHEXOL 300 MG/ML  SOLN COMPARISON:  05/17/2019 FINDINGS: Lower chest: No acute abnormality. Hepatobiliary: No focal liver abnormality is seen. Status post interval cholecystectomy. No biliary dilatation. Pancreas: Unremarkable. Spleen: Unremarkable. Adrenals/Urinary Tract: Adrenals are unremarkable. Kidneys and partially distended bladder are unremarkable. Stomach/Bowel: Stomach is within normal limits. There are few mildly dilated loops of small bowel with air-fluid levels. Distal colonic diverticulosis. Vascular/Lymphatic: Aortic atherosclerosis. No enlarged lymph nodes identified. Reproductive: Status post hysterectomy. No adnexal masses. Other: Small volume free fluid in the pelvis presumably related to above. No acute abnormality of the abdominal wall. Musculoskeletal: No acute osseous abnormality. IMPRESSION: Few mildly dilated loops of small bowel in the mid abdomen with air-fluid levels suggesting low-grade obstruction. Interval cholecystectomy. Colonic diverticulosis. Electronically Signed   By: Macy Mis M.D.   On: 08/09/2020 19:47    Chart has been reviewed    Assessment/Plan  69 y.o. female with medical history significant of COPD, CHF, CKD, iron deficiency,  Admitted for SBO  Present on Admission: . Abdominal pain CT scan for possible SBO     . CKD (chronic kidney disease) stage 3, GFR 30-59 ml/min chronic stable avoid nephrotoxic medication   . Essential hypertension patient does not take blood pressure medications resume Lasix if evidence of fluid overload   . GERD (gastroesophageal reflux disease) changed to Protonix IV     . HLD (hyperlipidemia) resume home medications when able to tolerate   . SBO (small bowel obstruction) (HCC) -  Likely cause  adhesions, history of cholecystectomy  - admit for conservative management  - NG tube at this point not indicated as patient has noted issues with nausea or vomiting for right now - NPO - KUB in AM - appreciate General surgery consult. Given history of pyloric stenosis appreciate GI consult as No evidence of stomach distention on CT   . Pyloric stenosis in adult appreciate GI involvement.  At this point no evidence of stomach dilatation on CT scan   . Hypokalemia mild we will replace check magnesium level   . Chronic diastolic CHF (congestive heart failure) (HCC) at this point appears to be in a dry side will hold Lasix gently rehydrate monitor for fluid overload   . COPD (chronic obstructive pulmonary disease) (HCC) chronic stable continue home   . Chronic respiratory failure with hypoxia (HCC) continue 2 L of oxygen  . Acute lower UTI -  - treat with Rocephin        await results of urine culture and adjust antibiotic coverage as needed    Other plan as per orders.  DVT  prophylaxis:  SCD         Code Status:    Code Status: Prior FULL CODE   as per patient   I had personally discussed CODE STATUS with patient   Family Communication:   Family not at  Bedside   Disposition Plan:     To home once workup is complete and patient is stable   Following barriers for discharge:                            Electrolytes corrected                                                           Pain controlled with PO medications                                transition to PO  antibiotics                             Will need to be able to tolerate PO                                                      Will need consultants to evaluate patient prior to discharge                      Would benefit from PT/OT eval prior to DC  Ordered                                        Consults called: general surgery and GI  Admission status:  ED Disposition    ED Disposition Condition [place]: [place] [[id number]]  Level of Care: Med-Surg [16]  May admit patient to [facility] or [facility] if equivalent level of care is available:: No  Covid Evaluation: Symptomatic Person Under Investigation (PUI)  Diagnosis: SBO (small bowel obstruction) ([place]) [[id number]]  Admitting Physician: Toy Baker [[id number]]  Attending Physician: Toy Baker [[id number]]  Estimated length of stay: past midnight tomorrow  Certification:: I certify this patient will need inpatient services for at least 2 midnights           inpatient     I Expect 2 midnight stay secondary to severity of patient's current illness need for inpatient interventions justified by the following:    Severe lab/radiological/exam abnormalities including:    SBO and extensive comorbidities including:     CHF     COPD/asthma     CKD .    That are currently affecting medical management.   I expect  patient to be hospitalized for 2 midnights requiring inpatient medical care.  Patient is at high risk for adverse outcome (such as loss of life or disability) if not treated.  Indication for inpatient stay as follows:    severe pain requiring acute inpatient management,  inability  to maintain oral hydration    Need for operative/procedural  intervention    Need for IV antibiotics, IV fluids,  , IV pain medications,     Level of care        medical floor      Lab Results  Component Value Date   [place] 08/09/2020     Precautions: admitted  as   Covid Negative     PPE: Used by the provider:   P100  eye Goggles,  Gloves     Marselino Slayton 08/09/2020, 11:36 PM     [facility]     after 2 AM please page floor coverage PA If 7AM-7PM, please contact the day team taking care of the patient using [email or web address]   Patient was evaluated in the context of the global COVID-19 pandemic, which necessitated consideration that the patient might be at risk for infection with the SARS-CoV-2 virus that causes COVID-19. Institutional protocols and algorithms that pertain to the evaluation of patients at risk for COVID-19 are in a state of rapid change based on information released by regulatory bodies including the CDC and federal and state organizations. These policies and algorithms were followed during the patient's care.

## 2020-08-09 NOTE — ED Provider Notes (Signed)
[place] [facility] DEPT Provider Note   CSN: [id number] Arrival date & time: 08/09/20  4680     History Chief Complaint  Patient presents with  . Abdominal Pain    BERKLEY WRIGHTSMAN is a 69 y.o. female history of hypertension, reflux, here presenting with abdominal pain.  Patient has chronic abdominal pain and diarrhea for the last several months.  Patient has seen Dr. Silverio Decamp about a month ago and had upper endoscopy and colonoscopy that were unremarkable.  Patient states that since yesterday, she had worsening epigastric and periumbilical pain.  Associated with some nausea as well as headaches.  Patient states that she is still passing gas.  Denies any fever or eating bad food.  Patient was given Zofran prior to arrival.  The history is provided by the patient.       Past Medical History:  Diagnosis Date  . Allergy   . Anemia   . Anxiety   . Arthritis   . Depression   . GERD (gastroesophageal reflux disease)   . Hypertension   . MVP (mitral valve prolapse)     Patient Active Problem List   Diagnosis Date Noted  . Polyp of sigmoid colon   . Polyp of cecum   . Gastritis and gastroduodenitis   . Pylorus ulcer   . Pyloric stenosis in adult   . Chronic cholecystitis 05/19/2020  . Oxygen dependent 03/30/2020  . Nausea 03/22/2020  . Peptic ulcer disease 03/22/2020  . Iron deficiency anemia 03/22/2020  . Acute pain of right knee 12/29/2019  . Acute left-sided low back pain without sciatica 12/29/2019  . Fall 04/08/2019  . Abdominal pain 02/19/2019  . Viral gastroenteritis 02/19/2019  . Asymptomatic bacteriuria 02/19/2019  . COPD exacerbation ([place]) 02/16/2019  . CKD (chronic kidney disease) stage 3, GFR 30-59 ml/min 01/13/2019  . CHF (congestive heart failure) ([place]) 01/13/2019  . Dizziness 01/13/2019  . Hoarseness 11/12/2018  . Dysphagia 11/12/2018  . Sleepiness 11/12/2018  . CVA (cerebral vascular accident) ([place]) 08/22/2018  . Left-sided  weakness 10/04/2017  . HLD (hyperlipidemia) 10/04/2017  . GERD (gastroesophageal reflux disease) 10/04/2017  . Anxiety 08/26/2017  . Essential hypertension   . Depression   . Diarrhea 02/22/2015  . Hypokalemia 02/22/2015  . Chronic lower back pain 04/29/2013  . Paresthesia 04/29/2013    Past Surgical History:  Procedure Laterality Date  . ABDOMINAL HYSTERECTOMY    . APPENDECTOMY    . BALLOON DILATION N/A 04/21/2020   Procedure: BALLOON DILATION;  Surgeon: Milus Banister, MD;  Location: [facility];  Service: Endoscopy;  Laterality: N/A;  pyloric/ GI  . BALLOON DILATION N/A 07/04/2020   Procedure: BALLOON DILATION;  Surgeon: Mauri Pole, MD;  Location: [facility];  Service: Endoscopy;  Laterality: N/A;  . BIOPSY  04/21/2020   Procedure: BIOPSY;  Surgeon: Milus Banister, MD;  Location: [facility];  Service: Endoscopy;;  . BIOPSY  07/04/2020   Procedure: BIOPSY;  Surgeon: Mauri Pole, MD;  Location: [facility];  Service: Endoscopy;;  EGD and COLON  . BREAST SURGERY Right 1988   tumor removal  . CHOLECYSTECTOMY N/A 05/18/2020   Procedure: LAPAROSCOPIC CHOLECYSTECTOMY;  Surgeon: Johnathan Hausen, MD;  Location: [facility];  Service: General;  Laterality: N/A;  . COLONOSCOPY WITH PROPOFOL N/A 07/04/2020   Procedure: COLONOSCOPY WITH PROPOFOL;  Surgeon: Mauri Pole, MD;  Location: [facility];  Service: Endoscopy;  Laterality: N/A;  . ESOPHAGOGASTRODUODENOSCOPY (EGD) WITH PROPOFOL N/A 04/21/2020   Procedure: ESOPHAGOGASTRODUODENOSCOPY (EGD) WITH  PROPOFOL;  Surgeon: Milus Banister, MD;  Location: [facility];  Service: Endoscopy;  Laterality: N/A;  . ESOPHAGOGASTRODUODENOSCOPY (EGD) WITH PROPOFOL N/A 07/04/2020   Procedure: ESOPHAGOGASTRODUODENOSCOPY (EGD) WITH PROPOFOL;  Surgeon: Mauri Pole, MD;  Location: [facility];  Service: Endoscopy;  Laterality: N/A;  . KNEE SURGERY  2005   2 times left  . POLYPECTOMY  07/04/2020   Procedure: POLYPECTOMY;  Surgeon:  Mauri Pole, MD;  Location: [facility];  Service: Endoscopy;;  . TUBAL LIGATION       OB History   No obstetric history on file.     Family History  Problem Relation Age of Onset  . Breast cancer Mother 13  . Ovarian cancer Mother 15  . Brain cancer Mother   . Hypertension Father   . Hyperlipidemia Father   . Heart disease Father   . Breast cancer Maternal Grandmother 73  . Diabetes Paternal Grandmother   . Breast cancer Paternal Grandmother 73  . Cancer Paternal Grandfather   . Breast cancer Maternal Aunt 60  . Breast cancer Maternal Aunt 61    Social History   Tobacco Use  . Smoking status: Former Smoker    Packs/day: 1.00    Years: 15.00    Pack years: 15.00    Types: Cigarettes    Quit date: 09/12/2017    Years since quitting: 2.9  . Smokeless tobacco: Never Used  Vaping Use  . Vaping Use: Never used  Substance Use Topics  . Alcohol use: Not Currently  . Drug use: No    Home Medications Prior to Admission medications   Medication Sig Start Date End Date Taking? Authorizing Provider  albuterol (VENTOLIN HFA) 108 (90 Base) MCG/ACT inhaler TAKE 2 PUFFS BY MOUTH EVERY 6 HOURS AS NEEDED FOR WHEEZE OR SHORTNESS OF BREATH Patient not taking: Reported on 06/21/2020 02/22/20   [name], MD  aspirin 81 MG EC tablet Take 81 mg by mouth daily. daily    [provider]  aspirin-acetaminophen-caffeine (EXCEDRIN MIGRAINE) [phone] MG tablet Take 2 tablets by mouth every 6 (six) hours as needed for headache.    [provider]  atorvastatin (LIPITOR) 40 MG tablet Take 40 mg by mouth daily.     [provider]  carvedilol (COREG) 3.125 MG tablet Take 3.125 mg by mouth in the morning and at bedtime.    [provider]  colestipol (COLESTID) 1 g tablet TAKE 1 TABLET (1 G TOTAL) BY MOUTH 2 (TWO) TIMES DAILY. 07/20/19   Mauri Pole, MD  DEXILANT 60 MG capsule TAKE 1 CAPSULE BY MOUTH EVERY DAY 07/20/20   Mauri Pole, MD    esomeprazole (NEXIUM) 40 MG capsule Take 1 capsule by mouth twice daily. Patient taking differently: Take 40 mg by mouth 2 (two) times daily.  03/30/20   Esterwood, Amy S, PA-C  Ferrous Sulfate (IRON) 325 (65 Fe) MG TABS Take 1 tablet (325 mg total) by mouth daily. 04/09/19   [name], MD  FLUoxetine (PROZAC) 40 MG capsule Take 40 mg by mouth daily.    [provider]  furosemide (LASIX) 40 MG tablet HOLD UNTIL seen by your doctor Patient taking differently: Take 40 mg by mouth daily.  02/19/19   Oretha Milch D, MD  gabapentin (NEURONTIN) 400 MG capsule Take 400 mg by mouth 3 (three) times daily.  10/20/18   [provider]  hydrOXYzine (ATARAX/VISTARIL) 25 MG tablet Take 12.5-25 mg by mouth 3 (three) times daily as  needed for anxiety.    [provider]  linaclotide Rolan Lipa) 145 MCG CAPS capsule Take 1 capsule (145 mcg total) by mouth daily before breakfast. 05/04/20   Nandigam, Venia Minks, MD  mirtazapine (REMERON) 45 MG tablet Take 45 mg by mouth at bedtime.     [provider]  ondansetron (ZOFRAN-ODT) 4 MG disintegrating tablet Take 1 tablet (4 mg total) by mouth every 8 (eight) hours as needed for nausea or vomiting. 06/30/20   Willia Craze, NP  OXYGEN Inhale into the lungs. 02 at bedtime    [provider]  sucralfate (CARAFATE) 1 g tablet TAKE 1 TABLET (1 G TOTAL) BY MOUTH 4 (FOUR) TIMES DAILY - WITH MEALS AND AT BEDTIME. 07/26/20   Mauri Pole, MD    Allergies    Meperidine, Strawberry extract, Strawberry (diagnostic), Wasp venom, and Wasp venom protein  Review of Systems   Review of Systems  Gastrointestinal: Positive for abdominal pain.  All other systems reviewed and are negative.   Physical Exam Updated Vital Signs BP (!) 185/101   Pulse 71   Temp 99.2 F (37.3 C) (Oral)   Resp 15   Ht 6\' 1"  (1.854 m)   Wt 110.2 kg   SpO2 98%   BMI 32.06 kg/m   Physical Exam Vitals and nursing note reviewed.   Constitutional:      Comments: Uncomfortable   HENT:     Head: Normocephalic.     Mouth/Throat:     Mouth: Mucous membranes are moist.  Eyes:     Extraocular Movements: Extraocular movements intact.  Cardiovascular:     Rate and Rhythm: Normal rate and regular rhythm.     Heart sounds: Normal heart sounds.  Pulmonary:     Effort: Pulmonary effort is normal.  Abdominal:     General: Abdomen is flat.     Comments: + epigastric tenderness   Skin:    General: Skin is warm.     Capillary Refill: Capillary refill takes less than 2 seconds.  Neurological:     General: No focal deficit present.     Mental Status: She is oriented to person, place, and time.  Psychiatric:        Mood and Affect: Mood normal.        Behavior: Behavior normal.     ED Results / Procedures / Treatments   Labs (all labs ordered are listed, but only abnormal results are displayed) Labs Reviewed  COMPREHENSIVE METABOLIC PANEL - Abnormal; Notable for the following components:      Result Value   Glucose, Bld 107 (*)    Calcium 8.8 (*)    Albumin 3.3 (*)    Anion gap 16 (*)    All other components within normal limits  CBC - Abnormal; Notable for the following components:   WBC 12.1 (*)    MCH 24.8 (*)    MCHC 28.0 (*)    RDW 16.1 (*)    All other components within normal limits  LIPASE, BLOOD  URINALYSIS, ROUTINE W REFLEX MICROSCOPIC    EKG None  Radiology No results found.  Procedures Procedures (including critical care time)  Medications Ordered in ED Medications  sodium chloride 0.9 % bolus 1,000 mL (1,000 mLs Intravenous New Bag/Given 08/09/20 1910)  ondansetron (ZOFRAN) injection 4 mg (4 mg Intravenous Given 08/09/20 1909)  morphine 4 MG/ML injection 4 mg (4 mg Intravenous Given 08/09/20 1909)  metoCLOPramide (REGLAN) injection 10 mg (10 mg Intravenous Given 08/09/20 1909)  diphenhydrAMINE (BENADRYL) injection 25 mg (25 mg Intravenous Given 08/09/20 1910)  iohexol (OMNIPAQUE) 300  MG/ML solution 100 mL (100 mLs Intravenous Contrast Given 08/09/20 1926)    ED Course  I have reviewed the triage vital signs and the nursing notes.  Pertinent labs & imaging results that were available during my care of the patient were reviewed by me and considered in my medical decision making (see chart for details).    MDM Rules/Calculators/A&P                          EMMIE FRAKES is a 69 y.o. female who presented with abdominal pain and loose stools.  This has been going on for the last several months and no obvious cause was found.  Patient had endoscopy and colonoscopy already.  Plan to get CBC, CMP, lipase, CT abdomen pelvis, urinalysis to further assess.  I suspect some viral gastroenteritis or IBS.  Plan to hydrate patient and give nausea medicine and reassess.   9:07 PM UA + UTI. CT showed possible SBO. Stopped vomiting after meds. Talked to Dr. Donne Hazel from surgery. He states that its likely enteritis and less likely small bowel obstruction.  Patient still vomiting after meds.  She also has a UTI and I ordered antibiotics.  At this point, he recommends not doing the NG tube. Medicine service to admit for possible partial SBO.  Final Clinical Impression(s) / ED Diagnoses Final diagnoses:  None    Rx / DC Orders ED Discharge Orders    None       Drenda Freeze, MD 08/09/20 2113

## 2020-08-09 NOTE — Telephone Encounter (Signed)
Called the patient back. No answer. Left her a message on her voicemail to stay there until evaluated. She has been dealing with nausea and vomiting since 07/29/20. She is in the ED at the suggestion of PCP back on 07/29/20. Assume she has exhausted her outpatient options and she is not getting better.

## 2020-08-09 NOTE — ED Triage Notes (Signed)
Pt states LUQ abdominal pain since 0100 followed with vomiting diarrhea. EMS administered 4 of zofran. 22 L hand.

## 2020-08-10 ENCOUNTER — Inpatient Hospital Stay (HOSPITAL_COMMUNITY): Payer: Medicare HMO

## 2020-08-10 DIAGNOSIS — K311 Adult hypertrophic pyloric stenosis: Secondary | ICD-10-CM

## 2020-08-10 DIAGNOSIS — R112 Nausea with vomiting, unspecified: Secondary | ICD-10-CM

## 2020-08-10 DIAGNOSIS — R1084 Generalized abdominal pain: Secondary | ICD-10-CM

## 2020-08-10 DIAGNOSIS — R933 Abnormal findings on diagnostic imaging of other parts of digestive tract: Secondary | ICD-10-CM

## 2020-08-10 DIAGNOSIS — K257 Chronic gastric ulcer without hemorrhage or perforation: Secondary | ICD-10-CM

## 2020-08-10 LAB — CBC WITH DIFFERENTIAL/PLATELET
Abs Immature Granulocytes: 0.03 10*3/uL (ref 0.00–0.07)
Basophils Absolute: 0 10*3/uL (ref 0.0–0.1)
Basophils Relative: 0 %
Eosinophils Absolute: 0.2 10*3/uL (ref 0.0–0.5)
Eosinophils Relative: 2 %
HCT: 34.3 % — ABNORMAL LOW (ref 36.0–46.0)
Hemoglobin: 10.3 g/dL — ABNORMAL LOW (ref 12.0–15.0)
Immature Granulocytes: 0 %
Lymphocytes Relative: 17 %
Lymphs Abs: 1.7 10*3/uL (ref 0.7–4.0)
MCH: 24.8 pg — ABNORMAL LOW (ref 26.0–34.0)
MCHC: 30 g/dL (ref 30.0–36.0)
MCV: 82.5 fL (ref 80.0–100.0)
Monocytes Absolute: 0.7 10*3/uL (ref 0.1–1.0)
Monocytes Relative: 6 %
Neutro Abs: 7.6 10*3/uL (ref 1.7–7.7)
Neutrophils Relative %: 75 %
Platelets: 299 10*3/uL (ref 150–400)
RBC: 4.16 MIL/uL (ref 3.87–5.11)
RDW: 16.3 % — ABNORMAL HIGH (ref 11.5–15.5)
WBC: 10.3 10*3/uL (ref 4.0–10.5)
nRBC: 0 % (ref 0.0–0.2)

## 2020-08-10 LAB — COMPREHENSIVE METABOLIC PANEL
ALT: 22 U/L (ref 0–44)
AST: 54 U/L — ABNORMAL HIGH (ref 15–41)
Albumin: 3.2 g/dL — ABNORMAL LOW (ref 3.5–5.0)
Alkaline Phosphatase: 116 U/L (ref 38–126)
Anion gap: 9 (ref 5–15)
BUN: 23 mg/dL (ref 8–23)
CO2: 27 mmol/L (ref 22–32)
Calcium: 8.3 mg/dL — ABNORMAL LOW (ref 8.9–10.3)
Chloride: 105 mmol/L (ref 98–111)
Creatinine, Ser: 0.97 mg/dL (ref 0.44–1.00)
GFR calc Af Amer: >60 mL/min (ref >60)
GFR calc non Af Amer: >60 mL/min (ref >60)
Glucose, Bld: 108 mg/dL — ABNORMAL HIGH (ref 70–99)
Potassium: 3.1 mmol/L — ABNORMAL LOW (ref 3.5–5.1)
Sodium: 141 mmol/L (ref 135–145)
Total Bilirubin: 0.6 mg/dL (ref 0.3–1.2)
Total Protein: 6.1 g/dL — ABNORMAL LOW (ref 6.5–8.1)

## 2020-08-10 LAB — PHOSPHORUS: Phosphorus: 2.8 mg/dL (ref 2.5–4.6)

## 2020-08-10 LAB — MAGNESIUM
Magnesium: 2.3 mg/dL (ref 1.7–2.4)
Magnesium: 2.3 mg/dL (ref 1.7–2.4)

## 2020-08-10 LAB — LACTIC ACID, PLASMA: Lactic Acid, Venous: 1 mmol/L (ref 0.5–1.9)

## 2020-08-10 LAB — TSH: TSH: 4.591 u[IU]/mL — ABNORMAL HIGH (ref 0.350–4.500)

## 2020-08-10 MED ORDER — ACETAMINOPHEN 325 MG PO TABS
650.0000 mg | ORAL_TABLET | ORAL | Status: DC | PRN
  Administered 2020-08-10 – 2020-08-15 (×7): 650 mg via ORAL
  Filled 2020-08-10 (×9): qty 2

## 2020-08-10 MED ORDER — KCL IN DEXTROSE-NACL 20-5-0.45 MEQ/L-%-% IV SOLN
INTRAVENOUS | Status: DC
  Filled 2020-08-10 (×5): qty 1000

## 2020-08-10 NOTE — Consult Note (Signed)
[facility] Consult Note  Morgan [name] 1951/11/06  [id number].    Requesting MD: Toy Baker Chief Complaint/Reason for Consult: SBO vs enteritis  HPI:  Morgan [name] is a 69yo female PMH HTN, HLD, hx CVA, anxiety/depression, migraines, GERD, who presented to [facility] yesterday with 2 days of worsening abdominal pain. She has a h/o IDA, PUD, and pyloric stenosis for which she sees [facility]. 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 She is currently on nexium, carafate, and linzess PRN at home for her abdominal issues. Patient presented to the ED yesterday with 2 days of worsening abdominal pain, nausea, vomiting, and diarrhea. Pain is diffuse but mostly in her central/upper abdomen. She feels bloated. She has had multiple episodes of nausea and vomiting. The first day she experienced several loose stools, nearly every 30 minutes. Then she had a day with constipation/ no BM, and then another day of frequent loose stools. Last BM was last night and loose. She still feels bloated and intermittently nausea. No flatus. No emesis since admission.  In the ED she underwent CT scan which showed few mildly dilated loops of small bowel in the mid abdomen with air-fluid levels suggesting low-grade obstruction. WBC 12.1, lactic acid 1.0. U/a concerning for UTI, Ucx pending. Patient was admitted to the medical service. Gastroenterology consulted. General surgery asked to see.  Abdominal surgical history: cholecystectomy, appendectomy, hysterectomy, tubal ligation Anticoagulants: none Nonsmoker Denies alcohol use Lives at home with roommates  Retired CNA  Review of Systems  Constitutional: Negative.   Respiratory: Negative.   Cardiovascular: Negative.    Gastrointestinal: Positive for abdominal pain, constipation, diarrhea, nausea and vomiting. Negative for blood in stool.  Genitourinary: Negative.    All systems reviewed and otherwise negative except for as above  Family History  Problem Relation Age of Onset  . Breast cancer Mother 15  . Ovarian cancer Mother 18  . Brain cancer Mother   . Hypertension Father   . Hyperlipidemia Father   . Heart disease Father   . Breast cancer Maternal Grandmother 52  . Diabetes Paternal Grandmother   . Breast cancer Paternal Grandmother 41  . Cancer Paternal Grandfather   . Breast cancer Maternal Aunt 60  . Breast cancer Maternal Aunt 60    Past Medical History:  Diagnosis Date  . Allergy   . Anemia   . Anxiety   . Arthritis   . Depression   . GERD (gastroesophageal reflux disease)   . Hypertension   . MVP (mitral valve prolapse)     Past Surgical History:  Procedure Laterality Date  . ABDOMINAL HYSTERECTOMY    . APPENDECTOMY    . BALLOON DILATION N/A 04/21/2020   Procedure: BALLOON DILATION;  Surgeon: Milus Banister, MD;  Location: [facility];  Service: Endoscopy;  Laterality: N/A;  pyloric/ GI  . BALLOON DILATION N/A 07/04/2020   Procedure: BALLOON DILATION;  Surgeon: Mauri Pole, MD;  Location: [facility];  Service: Endoscopy;  Laterality: N/A;  . BIOPSY  04/21/2020   Procedure: BIOPSY;  Surgeon: Milus Banister, MD;  Location: [facility];  Service: Endoscopy;;  . BIOPSY  07/04/2020   Procedure: BIOPSY;  Surgeon: Mauri Pole, MD;  Location: [facility];  Service: Endoscopy;;  EGD and COLON  . BREAST SURGERY Right 1988   tumor removal  .  CHOLECYSTECTOMY N/A 05/18/2020   Procedure: LAPAROSCOPIC CHOLECYSTECTOMY;  Surgeon: Johnathan Hausen, MD;  Location: [facility];  Service: General;  Laterality: N/A;  . COLONOSCOPY WITH PROPOFOL N/A 07/04/2020   Procedure: COLONOSCOPY WITH PROPOFOL;  Surgeon: Mauri Pole, MD;  Location: [facility];  Service: Endoscopy;   Laterality: N/A;  . ESOPHAGOGASTRODUODENOSCOPY (EGD) WITH PROPOFOL N/A 04/21/2020   Procedure: ESOPHAGOGASTRODUODENOSCOPY (EGD) WITH PROPOFOL;  Surgeon: Milus Banister, MD;  Location[facility];  Service: Endoscopy;  Laterality: N/A;  . ESOPHAGOGASTRODUODENOSCOPY (EGD) WITH PROPOFOL N/A 07/04/2020   Procedure: ESOPHAGOGASTRODUODENOSCOPY (EGD) WITH PROPOFOL;  Surgeon: Mauri Pole, MD;  Location[facility];  Service: Endoscopy;  Laterality: N/A;  . KNEE SURGERY  2005   2 times left  . POLYPECTOMY  07/04/2020   Procedure: POLYPECTOMY;  Surgeon: Mauri Pole, MD;  Location: [facility];  Service: Endoscopy;;  . TUBAL LIGATION      Social History:  reports that she quit smoking about 2 years ago. Her smoking use included cigarettes. She has a 15.00 pack-year smoking history. She has never used smokeless tobacco. She reports previous alcohol use. She reports that she does not use drugs.  Allergies:  Allergies  Allergen Reactions  . Meperidine Hives and Anaphylaxis  . Strawberry Extract Swelling  . Strawberry (Diagnostic) Hives  . Wasp Venom Hives  . Wasp Venom Protein     Medications Prior to Admission  Medication Sig Dispense Refill  . ARIPiprazole (ABILIFY) 2 MG tablet Take 2 mg by mouth daily.    Marland Kitchen aspirin 81 MG EC tablet Take 81 mg by mouth daily. daily    . aspirin-acetaminophen-caffeine (EXCEDRIN MIGRAINE) 250-250-65 MG tablet Take 2 tablets by mouth every 6 (six) hours as needed for headache.    Marland Kitchen atorvastatin (LIPITOR) 40 MG tablet Take 40 mg by mouth daily.     . carvedilol (COREG) 3.125 MG tablet Take 3.125 mg by mouth in the morning and at bedtime.    Marland Kitchen DEXILANT 60 MG capsule TAKE 1 CAPSULE BY MOUTH EVERY DAY (Patient taking differently: Take 60 mg by mouth daily. ) 90 capsule 1  . esomeprazole (NEXIUM) 40 MG capsule Take 1 capsule by mouth twice daily. (Patient taking differently: Take 40 mg by mouth 2 (two) times daily. ) 60 capsule 6  . Ferrous Sulfate  (IRON) 325 (65 Fe) MG TABS Take 1 tablet (325 mg total) by mouth daily. 90 each 0  . FLUoxetine (PROZAC) 40 MG capsule Take 40 mg by mouth daily.    . furosemide (LASIX) 40 MG tablet HOLD UNTIL seen by your doctor (Patient taking differently: Take 40 mg by mouth daily. ) 30 tablet   . gabapentin (NEURONTIN) 400 MG capsule Take 400 mg by mouth 3 (three) times daily.     . hydrOXYzine (ATARAX/VISTARIL) 25 MG tablet Take 12.5-25 mg by mouth as needed for anxiety.     Marland Kitchen linaclotide (LINZESS) 145 MCG CAPS capsule Take 1 capsule (145 mcg total) by mouth daily before breakfast. (Patient taking differently: Take 145 mcg by mouth as needed (constipation). ) 30 capsule 3  . mirtazapine (REMERON) 45 MG tablet Take 45 mg by mouth at bedtime.     . ondansetron (ZOFRAN-ODT) 4 MG disintegrating tablet Take 1 tablet (4 mg total) by mouth every 8 (eight) hours as needed for nausea or vomiting. 30 tablet 2  . OXYGEN Inhale into the lungs. 02 at bedtime    . sucralfate (CARAFATE) 1 g tablet TAKE 1 TABLET (1 G TOTAL) BY  MOUTH 4 (FOUR) TIMES DAILY - WITH MEALS AND AT BEDTIME. 360 tablet 1  . albuterol (VENTOLIN HFA) 108 (90 Base) MCG/ACT inhaler TAKE 2 PUFFS BY MOUTH EVERY 6 HOURS AS NEEDED FOR WHEEZE OR SHORTNESS OF BREATH (Patient not taking: Reported on 06/21/2020) 18 g 5  . colestipol (COLESTID) 1 g tablet TAKE 1 TABLET (1 G TOTAL) BY MOUTH 2 (TWO) TIMES DAILY. (Patient not taking: Reported on 08/09/2020) 180 tablet 1    Prior to Admission medications   Medication Sig Start Date End Date Taking? Authorizing Provider  ARIPiprazole (ABILIFY) 2 MG tablet Take 2 mg by mouth daily. 07/14/20  Yes [provider]  aspirin 81 MG EC tablet Take 81 mg by mouth daily. daily   Yes [provider]  aspirin-acetaminophen-caffeine (EXCEDRIN MIGRAINE) [phone] MG tablet Take 2 tablets by mouth every 6 (six) hours as needed for headache.   Yes [provider]  atorvastatin (LIPITOR) 40 MG tablet Take 40 mg  by mouth daily.    Yes [provider]  carvedilol (COREG) 3.125 MG tablet Take 3.125 mg by mouth in the morning and at bedtime.   Yes [provider]  DEXILANT 60 MG capsule TAKE 1 CAPSULE BY MOUTH EVERY DAY Patient taking differently: Take 60 mg by mouth daily.  07/20/20  Yes Nandigam, Venia Minks, MD  esomeprazole (NEXIUM) 40 MG capsule Take 1 capsule by mouth twice daily. Patient taking differently: Take 40 mg by mouth 2 (two) times daily.  03/30/20  Yes Esterwood, Amy S, PA-C  Ferrous Sulfate (IRON) 325 (65 Fe) MG TABS Take 1 tablet (325 mg total) by mouth daily. 04/09/19  Yes [name], MD  FLUoxetine (PROZAC) 40 MG capsule Take 40 mg by mouth daily.   Yes [provider]  furosemide (LASIX) 40 MG tablet HOLD UNTIL seen by your doctor Patient taking differently: Take 40 mg by mouth daily.  02/19/19  Yes Oretha Milch D, MD  gabapentin (NEURONTIN) 400 MG capsule Take 400 mg by mouth 3 (three) times daily.  10/20/18  Yes [provider]  hydrOXYzine (ATARAX/VISTARIL) 25 MG tablet Take 12.5-25 mg by mouth as needed for anxiety.    Yes [provider]  linaclotide (LINZESS) 145 MCG CAPS capsule Take 1 capsule (145 mcg total) by mouth daily before breakfast. Patient taking differently: Take 145 mcg by mouth as needed (constipation).  05/04/20  Yes Nandigam, Venia Minks, MD  mirtazapine (REMERON) 45 MG tablet Take 45 mg by mouth at bedtime.    Yes [provider]  ondansetron (ZOFRAN-ODT) 4 MG disintegrating tablet Take 1 tablet (4 mg total) by mouth every 8 (eight) hours as needed for nausea or vomiting. 06/30/20  Yes Willia Craze, NP  OXYGEN Inhale into the lungs. 02 at bedtime   Yes [provider]  sucralfate (CARAFATE) 1 g tablet TAKE 1 TABLET (1 G TOTAL) BY MOUTH 4 (FOUR) TIMES DAILY - WITH MEALS AND AT BEDTIME. 07/26/20  Yes Nandigam, Kavitha V, MD  albuterol (VENTOLIN HFA) 108 (90 Base) MCG/ACT inhaler TAKE 2 PUFFS BY MOUTH EVERY  6 HOURS AS NEEDED FOR WHEEZE OR SHORTNESS OF BREATH Patient not taking: Reported on 06/21/2020 02/22/20   [name], MD  colestipol (COLESTID) 1 g tablet TAKE 1 TABLET (1 G TOTAL) BY MOUTH 2 (TWO) TIMES DAILY. Patient not taking: Reported on 08/09/2020 07/20/19   Mauri Pole, MD    Blood pressure (!) 157/84, pulse 79, temperature 98.3 F (36.8 C), temperature source  Oral, resp. rate 18, height 6\' 1"  (1.854 m), weight 110.2 kg, SpO2 100 %. Physical Exam: General: pleasant, WD/WN white female who is laying in bed in NAD HEENT: head is normocephalic, atraumatic.  Sclera are noninjected.  PERRL.  Ears and nose without any masses or lesions.  Mouth is pink and moist. Dentition fair Heart: regular, rate, and rhythm.  Normal s1,s2. No obvious murmurs, gallops, or rubs noted.  Palpable pedal pulses bilaterally  Lungs: CTAB, no wheezes, rhonchi, or rales noted.  Respiratory effort nonlabored Abd: well healed laparoscopic and vertical incision distal to umbilicus, soft, mild distension, +BS, no masses, hernias, or organomegaly. Mild diffuse TTP without rebound or guarding, no peritonitis MS: no BUE/BLE edema, calves soft and nontender Skin: warm and dry with no masses, lesions, or rashes Psych: A&Ox4 with an appropriate affect Neuro: cranial nerves grossly intact, equal strength in BUE/BLE bilaterally, normal speech, thought process intact  Results for orders placed or performed during the hospital encounter of 08/09/20 (from the past 48 hour(s))  Lipase, blood     Status: None   Collection Time: 08/09/20 11:44 AM  Result Value Ref Range   Lipase 18 11 - 51 U/L    Comment: Performed at [facility], [place] [address], [place], [place] [address]  Comprehensive metabolic panel     Status: Abnormal   Collection Time: 08/09/20 11:44 AM  Result Value Ref Range   Sodium 141 135 - 145 mmol/L   Potassium 3.6 3.5 - 5.1 mmol/L   Chloride 103 98 - 111 mmol/L   CO2 22 22 - 32 mmol/L    Glucose, Bld 107 (H) 70 - 99 mg/dL    Comment: Glucose reference range applies only to samples taken after fasting for at least 8 hours.   BUN 22 8 - 23 mg/dL   Creatinine, Ser 0.84 0.44 - 1.00 mg/dL   Calcium 8.8 (L) 8.9 - 10.3 mg/dL   Total Protein 6.6 6.5 - 8.1 g/dL   Albumin 3.3 (L) 3.5 - 5.0 g/dL   AST 20 15 - 41 U/L   ALT 9 0 - 44 U/L   Alkaline Phosphatase 119 38 - 126 U/L   Total Bilirubin 0.4 0.3 - 1.2 mg/dL   GFR calc non Af Amer >60 >60 mL/min   GFR calc Af Amer >60 >60 mL/min   Anion gap 16 (H) 5 - 15    Comment: Performed at [facility], [place] [address], [place], [place] [address]  CBC     Status: Abnormal   Collection Time: 08/09/20 11:44 AM  Result Value Ref Range   WBC 12.1 (H) 4.0 - 10.5 K/uL   RBC 4.96 3.87 - 5.11 MIL/uL   Hemoglobin 12.3 12.0 - 15.0 g/dL   HCT 44.0 36 - 46 %   MCV 88.7 80.0 - 100.0 fL   MCH 24.8 (L) 26.0 - 34.0 pg   MCHC 28.0 (L) 30.0 - 36.0 g/dL   RDW 16.1 (H) 11.5 - 15.5 %   Platelets 215 150 - 400 K/uL   nRBC 0.0 0.0 - 0.2 %    Comment: Performed at [facility], [place] [address], [place], [place] [address]  Urinalysis, Routine w reflex microscopic Urine, Clean Catch     Status: Abnormal   Collection Time: 08/09/20  6:42 PM  Result Value Ref Range   Color, Urine AMBER (A) YELLOW    Comment: BIOCHEMICALS MAY BE AFFECTED BY COLOR   APPearance CLOUDY (A) CLEAR  Specific Gravity, Urine 1.028 1.005 - 1.030   pH 5.0 5.0 - 8.0   Glucose, UA NEGATIVE NEGATIVE mg/dL   Hgb urine dipstick MODERATE (A) NEGATIVE   Bilirubin Urine NEGATIVE NEGATIVE   Ketones, ur 5 (A) NEGATIVE mg/dL   Protein, ur 30 (A) NEGATIVE mg/dL   Nitrite POSITIVE (A) NEGATIVE   Leukocytes,Ua LARGE (A) NEGATIVE   RBC / HPF 6-10 0 - 5 RBC/hpf   WBC, UA >50 (H) 0 - 5 WBC/hpf   Bacteria, UA MANY (A) NONE SEEN   Squamous Epithelial / LPF 11-20 0 - 5   WBC Clumps PRESENT    Mucus PRESENT    Ca Oxalate Crys, UA PRESENT     Comment:  Performed at [facility], [place] [address], [place], [place] [address]  SARS Coronavirus 2 by RT PCR (hospital order, performed in [facility] hospital lab) Nasopharyngeal Nasopharyngeal Swab     Status: None   Collection Time: 08/09/20  8:59 PM   Specimen: Nasopharyngeal Swab  Result Value Ref Range   SARS Coronavirus 2 NEGATIVE NEGATIVE    Comment: (NOTE) SARS-CoV-2 target nucleic acids are NOT DETECTED.  The SARS-CoV-2 RNA is generally detectable in upper and lower respiratory specimens during the acute phase of infection. The lowest concentration of SARS-CoV-2 viral copies this assay can detect is 250 copies / mL. A negative result does not preclude SARS-CoV-2 infection and should not be used as the sole basis for treatment or other patient management decisions.  A negative result may occur with improper specimen collection / handling, submission of specimen other than nasopharyngeal swab, presence of viral mutation(s) within the areas targeted by this assay, and inadequate number of viral copies (<250 copies / mL). A negative result must be combined with clinical observations, patient history, and epidemiological information.  Fact Sheet for Patients:   StrictlyIdeas.no  Fact Sheet for Healthcare Providers: BankingDealers.co.za  This test is not yet approved or  cleared by the [place] FDA and has been authorized for detection and/or diagnosis of SARS-CoV-2 by FDA under an Emergency Use Authorization (EUA).  This EUA will remain in effect (meaning this test can be used) for the duration of the COVID-19 declaration under Section 564(b)(1) of the Act, 21 U.S.C. section 360bbb-3(b)(1), unless the authorization is terminated or revoked sooner.  Performed at [facility], [place] [address], [place], [place] [address]   Lactic acid, plasma     Status: None   Collection Time: 08/10/20  5:49 AM   Result Value Ref Range   Lactic Acid, Venous 1.0 0.5 - 1.9 mmol/L    Comment: Performed at [facility], [place] [address] [place], [place] [address]  Magnesium     Status: None   Collection Time: 08/10/20  5:49 AM  Result Value Ref Range   Magnesium 2.3 1.7 - 2.4 mg/dL    Comment: Performed at [facility], [place] [address], [place], [place] [address]  Magnesium     Status: None   Collection Time: 08/10/20  5:49 AM  Result Value Ref Range   Magnesium 2.3 1.7 - 2.4 mg/dL    Comment: Performed at [facility], [place] [address] [place], [place] [address]  Phosphorus     Status: None   Collection Time: 08/10/20  5:49 AM  Result Value Ref Range   Phosphorus 2.8 2.5 - 4.6 mg/dL    Comment: Performed at [facility], [place] [address] [place],  [place] [address]  CBC WITH DIFFERENTIAL     Status: Abnormal   Collection Time: 08/10/20  5:49 AM  Result Value Ref Range   WBC 10.3 4.0 - 10.5 K/uL   RBC 4.16 3.87 - 5.11 MIL/uL   Hemoglobin 10.3 (L) 12.0 - 15.0 g/dL   HCT 34.3 (L) 36 - 46 %   MCV 82.5 80.0 - 100.0 fL   MCH 24.8 (L) 26.0 - 34.0 pg   MCHC 30.0 30.0 - 36.0 g/dL   RDW 16.3 (H) 11.5 - 15.5 %   Platelets 299 150 - 400 K/uL   nRBC 0.0 0.0 - 0.2 %   Neutrophils Relative % 75 %   Neutro Abs 7.6 1.7 - 7.7 K/uL   Lymphocytes Relative 17 %   Lymphs Abs 1.7 0.7 - 4.0 K/uL   Monocytes Relative 6 %   Monocytes Absolute 0.7 0 - 1 K/uL   Eosinophils Relative 2 %   Eosinophils Absolute 0.2 0 - 0 K/uL   Basophils Relative 0 %   Basophils Absolute 0.0 0 - 0 K/uL   Immature Granulocytes 0 %   Abs Immature Granulocytes 0.03 0.00 - 0.07 K/uL    Comment: Performed at [facility], [place] [address] [place], [place] [address]  TSH     Status: Abnormal   Collection Time: 08/10/20  5:49 AM  Result Value Ref Range   TSH 4.591 (H) 0.350 - 4.500 uIU/mL    Comment: Performed by a 3rd Generation assay with a  functional sensitivity of <=0.01 uIU/mL. Performed at [facility], [place] [address], [place], [place] [address]   Comprehensive metabolic panel     Status: Abnormal   Collection Time: 08/10/20  5:49 AM  Result Value Ref Range   Sodium 141 135 - 145 mmol/L   Potassium 3.1 (L) 3.5 - 5.1 mmol/L   Chloride 105 98 - 111 mmol/L   CO2 27 22 - 32 mmol/L   Glucose, Bld 108 (H) 70 - 99 mg/dL    Comment: Glucose reference range applies only to samples taken after fasting for at least 8 hours.   BUN 23 8 - 23 mg/dL   Creatinine, Ser 0.97 0.44 - 1.00 mg/dL   Calcium 8.3 (L) 8.9 - 10.3 mg/dL   Total Protein 6.1 (L) 6.5 - 8.1 g/dL   Albumin 3.2 (L) 3.5 - 5.0 g/dL   AST 54 (H) 15 - 41 U/L   ALT 22 0 - 44 U/L   Alkaline Phosphatase 116 38 - 126 U/L   Total Bilirubin 0.6 0.3 - 1.2 mg/dL   GFR calc non Af Amer >60 >60 mL/min   GFR calc Af Amer >60 >60 mL/min   Anion gap 9 5 - 15    Comment: Performed at [facility], [place] [address], [place], [place] [address]   DG Abd 1 View  Result Date: 08/10/2020 CLINICAL DATA:  Small-bowel obstruction, not feeling better EXAM: ABDOMEN - 1 VIEW COMPARISON:  Portable exam 0838 hours compared to 09/03/2017 FINDINGS: Excreted contrast material within renal collecting systems and bladder. Decreased small bowel distension since prior study. Increased colonic gas. No bowel wall thickening. Surgical clips RIGHT upper quadrant likely reflect cholecystectomy. Bones demineralized. IMPRESSION: Decreased small bowel distension since prior study Electronically Signed   By: Lavonia Dana M.D.   On: 08/10/2020 08:54   CT ABDOMEN PELVIS W CONTRAST  Result Date: 08/09/2020 CLINICAL DATA:  Abdominal distension EXAM: CT ABDOMEN AND PELVIS WITH CONTRAST  TECHNIQUE: Multidetector CT imaging of the abdomen and pelvis was performed using the standard protocol following bolus administration of intravenous contrast. CONTRAST:  168mL OMNIPAQUE IOHEXOL 300  MG/ML  SOLN COMPARISON:  05/17/2019 FINDINGS: Lower chest: No acute abnormality. Hepatobiliary: No focal liver abnormality is seen. Status post interval cholecystectomy. No biliary dilatation. Pancreas: Unremarkable. Spleen: Unremarkable. Adrenals/Urinary Tract: Adrenals are unremarkable. Kidneys and partially distended bladder are unremarkable. Stomach/Bowel: Stomach is within normal limits. There are few mildly dilated loops of small bowel with air-fluid levels. Distal colonic diverticulosis. Vascular/Lymphatic: Aortic atherosclerosis. No enlarged lymph nodes identified. Reproductive: Status post hysterectomy. No adnexal masses. Other: Small volume free fluid in the pelvis presumably related to above. No acute abnormality of the abdominal wall. Musculoskeletal: No acute osseous abnormality. IMPRESSION: Few mildly dilated loops of small bowel in the mid abdomen with air-fluid levels suggesting low-grade obstruction. Interval cholecystectomy. Colonic diverticulosis. Electronically Signed   By: Macy Mis M.D.   On: 08/09/2020 19:47   Anti-infectives (From admission, onward)   Start     Dose/Rate Route Frequency Ordered Stop   08/10/20 1000  cefTRIAXone (ROCEPHIN) 2 g in sodium chloride 0.9 % 100 mL IVPB        2 g 200 mL/hr over 30 Minutes Intravenous Every 24 hours 08/09/20 2226     08/09/20 2030  cefTRIAXone (ROCEPHIN) 1 g in sodium chloride 0.9 % 100 mL IVPB        1 g 200 mL/hr over 30 Minutes Intravenous  Once 08/09/20 2016 08/09/20 2329        Assessment/Plan HTN HLD Hx CVA Anxiety/depression Migraines GERD  IDA PUD Pyloric stenosis - most recent EGD 07/04/20, dilated UTI  Abdominal pain, nausea, vomiting, diarrhea ?SBO - Patient has had 2 days of worsening abdominal pain, nausea, vomiting, and diarrhea. CT scan shows few mildly dilated loops of small bowel in the mid abdomen with air-fluid levels questioning low-grade obstruction. She is having bowel function and no n/v since  admission. I do not think that she has an obstruction. May be more of an enteritis. Would not recommend small bowel protocol at this time. If n/v returns and she stops having bowel function could consider NG tube and gastrograffin. Gastroenterology to consult as well. General surgery will sign off, please call with concerns.  ID - rocephin 8/24>> VTE - SCDs, ok for chemical DVT prophylaxis FEN - IVF, NPO Foley - none Follow up - no surgical follow up  [place], [facility] Surgery 08/10/2020, 9:47 AM Please see Amion for pager number during day hours 7:00am-4:30pm

## 2020-08-10 NOTE — Evaluation (Addendum)
Occupational Therapy Evaluation Patient Details Name: Morgan [name] MRN: [id number] DOB: 1951/06/15 Today's Date: 08/10/2020    History of Present Illness Morgan [name] is a 69yo female PMH HTN, HLD, hx CVA, anxiety/depression, migraines, GERD, who presented to [facility] yesterday with 2 days of worsening abdominal pain.   Clinical Impression   Morgan [name] is a 69 year old woman admitted to hospital with abdominal pain. On evaluation patient demonstrates functional strength, ability to perform ADLs and in room ambulation with min guard and holding onto IV pole due to decreased activity tolerance, generalized weakness and complaints of pain. Patient will benefit from skilled OT services to improve deficits and return to independence in order to return home at discharge. Unlikely that patient will need OT services at discharge. Patient on 2 liters at beginning of evaluation. Oxygen removed and o2 sats remained at 96% and above on room air. Rn notified.     Follow Up Recommendations  No OT follow up    Equipment Recommendations  None recommended by OT    Recommendations for Other Services       Precautions / Restrictions Precautions Precautions: None Restrictions Weight Bearing Restrictions: No      Mobility Bed Mobility Overal bed mobility: Modified Independent                Transfers Overall transfer level: Needs assistance Equipment used: None Transfers: Sit to/from Stand Sit to Stand: Min guard         General transfer comment: Min guard for ambulation in room for safety. Patient walking stiffly and slightly bent at waist due to abdominal pain while holding onto IV pole.    Balance Overall balance assessment: No apparent balance deficits (not formally assessed)                                         ADL either performed or assessed with clinical judgement   ADL Overall ADL's : Needs assistance/impaired Eating/Feeding: NPO   Grooming: Min  guard;Standing;Wash/dry hands   Upper Body Bathing: Set up;Sitting   Lower Body Bathing: Min guard;Sit to/from stand   Upper Body Dressing : Set up   Lower Body Dressing: Sit to/from stand;Min guard   Toilet Transfer: Min guard;Grab bars;Ambulation   Toileting- Clothing Manipulation and Hygiene: Sit to/from stand;Min guard       Functional mobility during ADLs: Min guard       Vision Baseline Vision/History: No visual deficits Vision Assessment?: No apparent visual deficits     Perception     Praxis      Pertinent Vitals/Pain Pain Assessment: Faces Faces Pain Scale: Hurts a little bit Pain Location: headache Pain Descriptors / Indicators: Aching;Grimacing Pain Intervention(s): Monitored during session     Hand Dominance Left   Extremity/Trunk Assessment Upper Extremity Assessment Upper Extremity Assessment: Overall WFL for tasks assessed   Lower Extremity Assessment Lower Extremity Assessment: Defer to PT evaluation   Cervical / Trunk Assessment Cervical / Trunk Assessment: Normal   Communication Communication Communication: No difficulties   Cognition Arousal/Alertness: Awake/alert Behavior During Therapy: WFL for tasks assessed/performed Overall Cognitive Status: Within Functional Limits for tasks assessed                                     General Comments  Exercises     Shoulder Instructions      Home Living Family/patient expects to be discharged to:: Private residence Living Arrangements: Non-relatives/Friends;Other relatives Available Help at Discharge: Family;Available 24 hours/day Type of Home: House       Home Layout: One level     Bathroom Shower/Tub: Occupational psychologist: Handicapped height     Home Equipment: Walker - 2 wheels;Grab bars - tub/shower;Hand held shower head;Shower seat;Bedside commode          Prior Functioning/Environment Level of Independence: Independent                  OT Problem List: Decreased activity tolerance;Pain      OT Treatment/Interventions: Self-care/ADL training;DME and/or AE instruction;Patient/family education;Therapeutic activities    OT Goals(Current goals can be found in the care plan section) Acute Rehab OT Goals Patient Stated Goal: To have pain improve OT Goal Formulation: With patient Time For Goal Achievement: 08/25/20 Potential to Achieve Goals: Good  OT Frequency:     Barriers to D/C:            Co-evaluation              AM-PAC OT "6 Clicks" Daily Activity     Outcome Measure Help from another person eating meals?: None Help from another person taking care of personal grooming?: A Little Help from another person toileting, which includes using toliet, bedpan, or urinal?: A Little Help from another person bathing (including washing, rinsing, drying)?: A Little Help from another person to put on and taking off regular upper body clothing?: A Little Help from another person to put on and taking off regular lower body clothing?: A Little 6 Click Score: 19   End of Session Equipment Utilized During Treatment: Oxygen Nurse Communication: Patient requests pain meds  Activity Tolerance: Patient tolerated treatment well Patient left: in chair;with call bell/phone within reach  OT Visit Diagnosis: Muscle weakness (generalized) (M62.81);Pain                Time: 1128-1140 OT Time Calculation (min): 12 min Charges:  OT General Charges $OT Visit: 1 Visit OT Evaluation $OT Eval Low Complexity: 1 Low  Sherrica Niehaus, OTR/L [place]  Office [phone] Pager: [place] 08/10/2020, 1:46 PM

## 2020-08-10 NOTE — Progress Notes (Signed)
PT Cancellation Note  Patient Details Name: Morgan [name] MRN: [id number] DOB: 10-30-1951   Cancelled Treatment:    Reason Eval/Treat Not Completed: Medical issues which prohibited therapy, patient reports nausea and just ambulated self to BR. Will check back tomorrow.    Claretha Cooper 08/10/2020, 2:32 PM  Tresa Endo PT Acute Rehabilitation Services Pager [phone] Office [phone]

## 2020-08-10 NOTE — Consult Note (Addendum)
Referring Provider:  [facility]         Primary Care Physician:  [name], MD Primary Gastroenterologist: Denzil Magnuson, MD            We were asked to see this patient for:   Nausea, vomiting and abdominal pain                ASSESSMENT /  PLAN    69 year old female with PMH significant for iron deficiency anemia, PUD/partial pyloric stenosis status post balloon dilation, hypertension, CHF, COPD, CKD, colon polyps, appendectomy, hysterectomy, cholecystectomy  # Nausea, vomiting, recent loose stools, generalized abdominal pain / partial small bowel obstruction by  CT scan.  --partial bowel obstruction? Ileus from viral enteritis?  --NPO for now --Currently not vomiting so holding off NG decompression.  --General Surgery will be evaluating --Stool studies if has significant diarrhea  # Hx of PUD / pyloric stenosis, s/p dilation in July 2021.  --No gastric distention on imaging so GOO related to pyloric stenosis seems unlikely at this point.  --Continue IV PPI   # Hx, normocytic but recently found to be IDA --Already evaluated by EGD / colonoscopy in July 2021 with findings of gastritis with hemorrhage / PUD / colon polyps.  --Continue PPI --Hgb is stable at 10.3 ( baseline). The 12.3 yesterday was likely inaccurate from hemoconcentration in setting of vomiting.    HPI:    Chief Complaint: nausea, vomiting and abdominal pain.   Morgan [name] is a 69 y.o. female not [place] for history of chronic iron deficiency anemia and PUD / partial pyloric stenosis.  She was hospitalized in June of this year for chronic cholecystitis, underwent cholecystectomy.  She was seen in the office mid June for hospital follow-up and evaluation of iron deficiency anemia. She was scheduled for colonoscopy as well as an EGD ( evaluate IDA and also pyloric stenosis).  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  Several colon polyps  were removed. She underwent dilation of pyloric stenosis.   Patient presented to ED yesterday with nausea, vomiting and abdominal pain. She has continued to have frequent nausea without vomiting since EGD with dilation of pylorus in July. However, 1.5 weeks ago she developed increasing nausea with associated vomiting and mid generalized abdominal pain. The pain is nearly constant as is nausea but vomiting is mainly just associated with trying to eat. She has post-prandial vomiting about 50 % of the time. Liquids are tolerated much better than solids. No fevers. She had diarrhea a couple of days ago and then a couple of episodes of loose stool yesterday. No BMs today. Recently resumed Remeron after having been switched to a different medication( which caused nightmares)   In ED, her CMP, lipase were unremarkable. WBC 12.1. CT scan  abd/ pelvis with contrast remarkable for a few mildly dilated loops of small bowel in mid abdomen with air fluid levels. No gastric distention.    PREVIOUS ENDOSCOPIC EVALUATIONS / GI STUDIES :  8/224/21 CT scan  abd/ pelvis with contrast -Few mildly dilated loops of small bowel in the mid abdomen with air-fluid levels suggesting low-grade obstruction.  07/04/20 EGD LA Grade B reflux esophagitis with no bleeding. - Small hiatal hernia. - Gastritis with hemorrhage. Biopsied. - Non-bleeding gastric ulcer with a clean ulcer base (Forrest Class III). Biopsied. - Gastric stenosis was found at the pylorus. Dilated. - Rule out malignancy, duodenal mass. Biopsied. - Normal second portion of  the duodenum.  07/04/20 Colonoscopy Three 1 to 2 mm polyps in the sigmoid colon and in the cecum, removed with a cold biopsy forceps. Resected and retrieved. - Two 4 to 6 mm polyps in the sigmoid colon, removed with a cold snare. Resected and 1 of the 2 polyps retrieved. - Moderate diverticulosis in the sigmoid colon, in the descending colon and in the ascending colon. There was  evidence of an impacted diverticulum. - Non-bleeding internal hemorrhoids.  FINAL MICROSCOPIC DIAGNOSIS:   A. PYLORIC CHANNEL, BIOPSY:  - Gastric antral mucosa with nonspecific reactive gastropathy  - Focal intestinal metaplasia, negative for dysplasia or carcinoma   B. GASTRIC, RANDOM, BIOPSY:  - Gastric antral and oxyntic mucosa with nonspecific reactive  gastropathy  - Warthin Starry stain is negative for Helicobacter pylori   C. CECAL POLYP, SIGMOID COLON POLYPS, POLYPECTOMY:  - Tubular adenoma without high-grade dysplasia or malignancy  - Hyperplastic polyp(s)   04/21/20 EGD Normal GE junction. - Chronic, benign appearing pyloric stenosis. Biopsied and dilated to 39mm with CRE balloon. - Mild, non-specific gastritis was biopsied to check for H. pylori.    Past Medical History:  Diagnosis Date  . Allergy   . Anemia   . Anxiety   . Arthritis   . Depression   . GERD (gastroesophageal reflux disease)   . Hypertension   . MVP (mitral valve prolapse)     Past Surgical History:  Procedure Laterality Date  . ABDOMINAL HYSTERECTOMY    . APPENDECTOMY    . BALLOON DILATION N/A 04/21/2020   Procedure: BALLOON DILATION;  Surgeon: Milus Banister, MD;  Location: [facility];  Service: Endoscopy;  Laterality: N/A;  pyloric/ GI  . BALLOON DILATION N/A 07/04/2020   Procedure: BALLOON DILATION;  Surgeon: Mauri Pole, MD;  Location: [facility];  Service: Endoscopy;  Laterality: N/A;  . BIOPSY  04/21/2020   Procedure: BIOPSY;  Surgeon: Milus Banister, MD;  Location: [facility];  Service: Endoscopy;;  . BIOPSY  07/04/2020   Procedure: BIOPSY;  Surgeon: Mauri Pole, MD;  Location: [facility];  Service: Endoscopy;;  EGD and COLON  . BREAST SURGERY Right 1988   tumor removal  . CHOLECYSTECTOMY N/A 05/18/2020   Procedure: LAPAROSCOPIC CHOLECYSTECTOMY;  Surgeon: Johnathan Hausen, MD;  Location: [facility];  Service: General;  Laterality: N/A;  . COLONOSCOPY WITH PROPOFOL  N/A 07/04/2020   Procedure: COLONOSCOPY WITH PROPOFOL;  Surgeon: Mauri Pole, MD;  Location: [facility];  Service: Endoscopy;  Laterality: N/A;  . ESOPHAGOGASTRODUODENOSCOPY (EGD) WITH PROPOFOL N/A 04/21/2020   Procedure: ESOPHAGOGASTRODUODENOSCOPY (EGD) WITH PROPOFOL;  Surgeon: Milus Banister, MD;  Location: [facility];  Service: Endoscopy;  Laterality: N/A;  . ESOPHAGOGASTRODUODENOSCOPY (EGD) WITH PROPOFOL N/A 07/04/2020   Procedure: ESOPHAGOGASTRODUODENOSCOPY (EGD) WITH PROPOFOL;  Surgeon: Mauri Pole, MD;  Location: [facility];  Service: Endoscopy;  Laterality: N/A;  . KNEE SURGERY  2005   2 times left  . POLYPECTOMY  07/04/2020   Procedure: POLYPECTOMY;  Surgeon: Mauri Pole, MD;  Location: [facility];  Service: Endoscopy;;  . TUBAL LIGATION      Prior to Admission medications   Medication Sig Start Date End Date Taking? Authorizing Provider  ARIPiprazole (ABILIFY) 2 MG tablet Take 2 mg by mouth daily. 07/14/20  Yes [provider]  aspirin 81 MG EC tablet Take 81 mg by mouth daily. daily   Yes [provider]  aspirin-acetaminophen-caffeine (EXCEDRIN MIGRAINE) [phone] MG tablet Take 2 tablets by mouth every 6 (six)  hours as needed for headache.   Yes [provider]  atorvastatin (LIPITOR) 40 MG tablet Take 40 mg by mouth daily.    Yes [provider]  carvedilol (COREG) 3.125 MG tablet Take 3.125 mg by mouth in the morning and at bedtime.   Yes [provider]  DEXILANT 60 MG capsule TAKE 1 CAPSULE BY MOUTH EVERY DAY Patient taking differently: Take 60 mg by mouth daily.  07/20/20  Yes Nandigam, Venia Minks, MD  esomeprazole (NEXIUM) 40 MG capsule Take 1 capsule by mouth twice daily. Patient taking differently: Take 40 mg by mouth 2 (two) times daily.  03/30/20  Yes Esterwood, Amy S, PA-C  Ferrous Sulfate (IRON) 325 (65 Fe) MG TABS Take 1 tablet (325 mg total) by mouth daily. 04/09/19  Yes [name], MD    FLUoxetine (PROZAC) 40 MG capsule Take 40 mg by mouth daily.   Yes [provider]  furosemide (LASIX) 40 MG tablet HOLD UNTIL seen by your doctor Patient taking differently: Take 40 mg by mouth daily.  02/19/19  Yes Oretha Milch D, MD  gabapentin (NEURONTIN) 400 MG capsule Take 400 mg by mouth 3 (three) times daily.  10/20/18  Yes [provider]  hydrOXYzine (ATARAX/VISTARIL) 25 MG tablet Take 12.5-25 mg by mouth as needed for anxiety.    Yes [provider]  linaclotide (LINZESS) 145 MCG CAPS capsule Take 1 capsule (145 mcg total) by mouth daily before breakfast. Patient taking differently: Take 145 mcg by mouth as needed (constipation).  05/04/20  Yes Nandigam, Venia Minks, MD  mirtazapine (REMERON) 45 MG tablet Take 45 mg by mouth at bedtime.    Yes [provider]  ondansetron (ZOFRAN-ODT) 4 MG disintegrating tablet Take 1 tablet (4 mg total) by mouth every 8 (eight) hours as needed for nausea or vomiting. 06/30/20  Yes Willia Craze, NP  OXYGEN Inhale into the lungs. 02 at bedtime   Yes [provider]  sucralfate (CARAFATE) 1 g tablet TAKE 1 TABLET (1 G TOTAL) BY MOUTH 4 (FOUR) TIMES DAILY - WITH MEALS AND AT BEDTIME. 07/26/20  Yes Nandigam, Kavitha V, MD  albuterol (VENTOLIN HFA) 108 (90 Base) MCG/ACT inhaler TAKE 2 PUFFS BY MOUTH EVERY 6 HOURS AS NEEDED FOR WHEEZE OR SHORTNESS OF BREATH Patient not taking: Reported on 06/21/2020 02/22/20   [name], MD  colestipol (COLESTID) 1 g tablet TAKE 1 TABLET (1 G TOTAL) BY MOUTH 2 (TWO) TIMES DAILY. Patient not taking: Reported on 08/09/2020 07/20/19   Mauri Pole, MD    Current Facility-Administered Medications  Medication Dose Route Frequency Provider Last Rate Last Admin  . 0.9 %  sodium chloride infusion   Intravenous Continuous Toy Baker, MD 75 mL/hr at 08/09/20 2342 New Bag at 08/09/20 2342  . albuterol (PROVENTIL) (2.5 MG/3ML) 0.083% nebulizer solution 2.5 mg  2.5 mg  Nebulization Q2H PRN Doutova, Anastassia, MD      . atorvastatin (LIPITOR) tablet 40 mg  40 mg Oral Daily Doutova, Anastassia, MD      . cefTRIAXone (ROCEPHIN) 2 g in sodium chloride 0.9 % 100 mL IVPB  2 g Intravenous Q24H Doutova, Anastassia, MD      . morphine 2 MG/ML injection 2 mg  2 mg Intravenous Q2H PRN Toy Baker, MD   2 mg at 08/10/20 0340  . morphine 2 MG/ML injection 2-4 mg  2-4 mg Intravenous Q4H PRN Doutova, Anastassia, MD      . ondansetron (ZOFRAN) tablet 4 mg  4 mg Oral Q6H PRN Toy Baker, MD       Or  . ondansetron (ZOFRAN) injection 4 mg  4 mg Intravenous Q6H PRN Doutova, Anastassia, MD   4 mg at 08/10/20 0340  . pantoprazole (PROTONIX) injection 40 mg  40 mg Intravenous QHS Doutova, Nyoka Lint, MD   40 mg at 08/09/20 2351  . sodium chloride flush (NS) 0.9 % injection 3 mL  3 mL Intravenous Q12H Toy Baker, MD        Allergies as of 08/09/2020 - Review Complete 08/09/2020  Allergen Reaction Noted  . Meperidine Hives and Anaphylaxis 04/28/2013  . Strawberry extract Swelling 08/09/2020  . Strawberry (diagnostic) Hives 08/26/2017  . Wasp venom Hives 04/11/2020  . Wasp venom protein  08/09/2020    Family History  Problem Relation Age of Onset  . Breast cancer Mother 73  . Ovarian cancer Mother 69  . Brain cancer Mother   . Hypertension Father   . Hyperlipidemia Father   . Heart disease Father   . Breast cancer Maternal Grandmother 38  . Diabetes Paternal Grandmother   . Breast cancer Paternal Grandmother 95  . Cancer Paternal Grandfather   . Breast cancer Maternal Aunt 60  . Breast cancer Maternal Aunt 60    Social History   Socioeconomic History  . Marital status: Single    Spouse name: Not on file  . Number of children: 2  . Years of education: Not on file  . Highest education level: Not on file  Occupational History  . Not on file  Tobacco Use  . Smoking status: Former Smoker    Packs/day: 1.00    Years: 15.00    Pack  years: 15.00    Types: Cigarettes    Quit date: 09/12/2017    Years since quitting: 2.9  . Smokeless tobacco: Never Used  Vaping Use  . Vaping Use: Never used  Substance and Sexual Activity  . Alcohol use: Not Currently  . Drug use: No  . Sexual activity: Not Currently  Other Topics Concern  . 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 Difficulty of Paying Living Expenses: Not on file  Food Insecurity:   . Worried About Charity fundraiser in the Last Year: Not on file  . Ran Out of Food in the Last Year: Not on file  Transportation Needs:   . Lack of Transportation (Medical): Not on file  . Lack of Transportation (Non-Medical): Not on file  Physical Activity:   . Days of Exercise per Week: Not on file  . Minutes of Exercise per Session: Not on file  Stress:   . Feeling of Stress : Not on file  Social Connections:   . Frequency of Communication with Friends and Family: Not on file  . Frequency of Social Gatherings with Friends and Family: Not on file  . Attends Religious Services: Not on file  . Active Member of Clubs or Organizations: Not on  file  . Attends Archivist Meetings: Not on file  . Marital Status: Not on file  Intimate Partner Violence:   . Fear of Current or Ex-Partner: Not on file  . Emotionally Abused: Not on file  . Physically Abused: Not on file  . Sexually Abused: Not on file    Review of Systems: All systems reviewed and negative except where noted in HPI.  Physical  Exam: Vital signs in last 24 hours: Temp:  [97.6 F (36.4 C)-98.3 F (36.8 C)] 98.3 F (36.8 C) (08/25 0519) Pulse Rate:  [65-79] 79 (08/25 0519) Resp:  [12-18] 18 (08/25 0519) BP: (114-185)/(60-101) 157/84 (08/25 0519) SpO2:  [97 %-100 %] 100 % (08/25 0519) Last BM Date: 08/10/20 General:   Alert, well-developed,  female in NAD Psych:  Pleasant, cooperative. Normal mood and affect. Eyes:  Pupils equal, sclera clear, no icterus.   Conjunctiva pink. Ears:  Normal auditory acuity. Nose:  No deformity, discharge,  or lesions. Neck:  Supple; no masses Lungs:  Clear throughout to auscultation.   No wheezes, crackles, or rhonchi.  Heart:  Regular rate and rhythm; no murmurs, no lower extremity edema Abdomen:  Soft, mild mid abdominal tenderness.  BS active, no palp mass   Rectal:  Deferred  Msk:  Symmetrical without gross deformities. . Neurologic:  Alert and  oriented x4;  grossly normal neurologically. Skin:  Intact without significant lesions or rashes.   Intake/Output from previous day: 08/24 0701 - 08/25 0700 In: 1666.3 [I.V.:466.3; IV Piggyback:1200] Out: -  Intake/Output this shift: No intake/output data recorded.  Lab Results: Recent Labs    08/09/20 1144 08/10/20 0549  WBC 12.1* 10.3  HGB 12.3 10.3*  HCT 44.0 34.3*  PLT 215 299   BMET Recent Labs    08/09/20 1144 08/10/20 0549  NA 141 141  K 3.6 3.1*  CL 103 105  CO2 22 27  GLUCOSE 107* 108*  BUN 22 23  CREATININE 0.84 0.97  CALCIUM 8.8* 8.3*   LFT Recent Labs    08/10/20 0549  PROT 6.1*  ALBUMIN 3.2*  AST 54*  ALT 22  ALKPHOS 116  BILITOT 0.6   PT/INR No results for input(s): LABPROT, INR in the last 72 hours. Hepatitis Panel No results for input(s): HEPBSAG, HCVAB, HEPAIGM, HEPBIGM in the last 72 hours.   . CBC Latest Ref Rng & Units 08/10/2020 08/09/2020 05/20/2020  WBC 4.0 - 10.5 K/uL 10.3 12.1(H) 6.6  Hemoglobin 12.0 - 15.0 g/dL 10.3(L) 12.3 9.3(L)  Hematocrit 36 - 46 % 34.3(L) 44.0  31.2(L)  Platelets 150 - 400 K/uL 299 215 199    . CMP Latest Ref Rng & Units 08/10/2020 08/09/2020 05/20/2020  Glucose 70 - 99 mg/dL 108(H) 107(H) 86  BUN 8 - 23 mg/dL 23 22 26(H)  Creatinine 0.44 - 1.00 mg/dL 0.97 0.84 0.89  Sodium 135 - 145 mmol/L 141 141 144  Potassium 3.5 - 5.1 mmol/L 3.1(L) 3.6 3.5  Chloride 98 - 111 mmol/L 105 103 104  CO2 22 - 32 mmol/L 27 22 29   Calcium 8.9 - 10.3 mg/dL 8.3(L) 8.8(L) 8.5(L)  Total Protein 6.5 - 8.1 g/dL 6.1(L) 6.6 5.7(L)  Total Bilirubin 0.3 - 1.2 mg/dL 0.6 0.4 0.5  Alkaline Phos 38 - 126 U/L 116 119 80  AST 15 - 41 U/L 54(H) 20 19  ALT 0 - 44 U/L 22 9 17    Studies/Results: DG Abd 1 View  Result Date: 08/10/2020 CLINICAL DATA:  Small-bowel obstruction, not feeling better  EXAM: ABDOMEN - 1 VIEW COMPARISON:  Portable exam 0838 hours compared to 09/03/2017 FINDINGS: Excreted contrast material within renal collecting systems and bladder. Decreased small bowel distension since prior study. Increased colonic gas. No bowel wall thickening. Surgical clips RIGHT upper quadrant likely reflect cholecystectomy. Bones demineralized. IMPRESSION: Decreased small bowel distension since prior study Electronically Signed   By: Lavonia Dana M.D.   On: 08/10/2020 08:54   CT ABDOMEN PELVIS W CONTRAST  Result Date: 08/09/2020 CLINICAL DATA:  Abdominal distension EXAM: CT ABDOMEN AND PELVIS WITH CONTRAST TECHNIQUE: Multidetector CT imaging of the abdomen and pelvis was performed using the standard protocol following bolus administration of intravenous contrast. CONTRAST:  115mL OMNIPAQUE IOHEXOL 300 MG/ML  SOLN COMPARISON:  05/17/2019 FINDINGS: Lower chest: No acute abnormality. Hepatobiliary: No focal liver abnormality is seen. Status post interval cholecystectomy. No biliary dilatation. Pancreas: Unremarkable. Spleen: Unremarkable. Adrenals/Urinary Tract: Adrenals are unremarkable. Kidneys and partially distended bladder are unremarkable. Stomach/Bowel: Stomach is within  normal limits. There are few mildly dilated loops of small bowel with air-fluid levels. Distal colonic diverticulosis. Vascular/Lymphatic: Aortic atherosclerosis. No enlarged lymph nodes identified. Reproductive: Status post hysterectomy. No adnexal masses. Other: Small volume free fluid in the pelvis presumably related to above. No acute abnormality of the abdominal wall. Musculoskeletal: No acute osseous abnormality. IMPRESSION: Few mildly dilated loops of small bowel in the mid abdomen with air-fluid levels suggesting low-grade obstruction. Interval cholecystectomy. Colonic diverticulosis. Electronically Signed   By: Macy Mis M.D.   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  Therefore, I agree with the diagnoses recorded above. In addition,  I have personally interviewed and examined the patient and have personally reviewed any abdominal/pelvic CT scan images. I also reviewed recent EGD and colonoscopy report by Dr. Silverio Decamp as well as her most recent office note.  My additional thoughts are as follows:  This patient has multiple chronic digestive symptoms including fairly generalized abdominal pain as well as nausea with intermittent vomiting.  Clinical course over many months suggest there is probably an underlying functional constipation contributing to some of the symptoms.  She was then having more focal  right upper quadrant pain which resolved after cholecystitis.  She tells me she really just has not felt well for months, and is not clear to what extent the pyloric stenosis is contributing.  There was a prepyloric ulcer from concomitant use of low-dose aspirin and Excedrin Migraine. In recent days I think she probably got a superimposed acute gastroenteritis accounting for her more severe nausea, new onset watery diarrhea and the CT scan findings.   Our plan is for an upper endoscopy with probable balloon dilation of the pyloric stenosis tomorrow.  I am hopeful that will help some of her nausea and vomiting, but have told her if she will have some persistent symptoms even after discharge due to the chronic  and complex nature of her condition. She was agreeable to an upper endoscopy after discussion of procedure and risks.  The benefits and risks of the planned procedure were described in detail with the patient or (when appropriate) their health care proxy.  Risks were outlined as including, but not limited to, bleeding, infection, perforation, adverse medication reaction leading to cardiac or pulmonary decompensation, pancreatitis (if ERCP).  The limitation of incomplete mucosal visualization was also discussed.  No guarantees or warranties were given.  Patient at increased risk for cardiopulmonary complications of procedure due to medical comorbidities.  Stool studies ordered for infectious pathogens.  Sips of water and ice chips only today, n.p.o. after midnight.  [name] [phone]

## 2020-08-10 NOTE — Progress Notes (Signed)
[facility]                                                                              Patient Demographics  Morgan [name], is a 69 y.o. female, DOB - 1951-09-18, WUJ:[id number]  Admit date - 08/09/2020   Admitting Physician Toy Baker, MD  Outpatient Primary MD for the patient is [name], MD  Outpatient specialists:   LOS - 1  days   Medical records reviewed and are as summarized below:    Chief Complaint  Patient presents with  . Abdominal Pain       Brief summary   SHAWNTIA [name] is a 69 y.o. female with medical history significant of COPD on 2L , CHF, CKD, chronic iron deficiency, PUD/partial pyloric stenosis presented with significant nausea, hard time drinking.  Patient also reported acute worsening of her chronic abdominal pain, decreased urine output.   Was hospitalized in June, 2021 for chronic cholecystitis, underwent cholecystectomy.  She was evaluated by GI outpatient, was scheduled for colonoscopy and EGD, findings include gastric stenosis of the pylorus, nonbleeding gastric ulcer, gastritis with hemorrhage, several colon polyps were removed.  She underwent dilation of the pyloric stenosis.   Patient reported nausea, vomiting, abdominal pain, frequent nausea without vomiting since the endoscopy.  Also had diarrhea couple days ago and a day prior to admission. CT abdomen pelvis showed few mildly dilated loops of small bowel in the midabdomen with air-fluid levels, no gastric distention.   UA positive for UTI.  Assessment & Plan    Principal problem  Acute nausea vomiting abdominal pain, diarrhea, partial SBO on CT -Currently nauseous but no vomiting.  GI and general surgery were consulted, hold off on NG decompression -Placed on IV fluid hydration, appears to be dehydrated -N.p.o. after midnight for possible endoscopy   Active problems  History of PUD, pyloric stenosis status post dilation in 06/2020, GERD -Continue IV PPI, n.p.o.  for possible EGD in a.m.  Dehydration -Current IV fluid hydration - UA + ketones    Acute lower UTI -Follow urine culture and sensitivities, continue IV Rocephin  Hypokalemia -Replaced    Essential hypertension -BP currently stable     HLD (hyperlipidemia) -Continue Lipitor     CKD (chronic kidney disease) stage 3a, GFR 30-59 ml/min -Creatinine currently at baseline    Chronic diastolic CHF (congestive heart failure) (HCC) -Currently appears to be dehydrated, placed on IV fluid hydration -Follow I's and O's closely, avoid volume overload    COPD (chronic obstructive pulmonary disease) (HCC), chronic respiratory failure with hypoxia on 2 L O2 -Currently no wheezing, continue bronchodilators as needed   History of chronic iron deficiency anemia -Previous EGD and colonoscopy on 07/04/2020, negative for H. pylori, cecal polyp with tubular adenoma without high-grade dysplasia or malignancy GI following   Obesity Estimated body mass index is 32.06 kg/m as calculated from the following:   Height as of this encounter: 6\' 1"  (1.854 m).   Weight as of this encounter: 110.2 kg.  Code Status: Full code DVT Prophylaxis: SCDs Family Communication: Discussed all imaging results, lab results, explained to the patient   Disposition Plan:  Status is: Inpatient  Remains inpatient appropriate because:IV treatments appropriate due to intensity of illness or inability to take PO   Dispo: The patient is from: Home              Anticipated d/c is to: Home              Anticipated d/c date is: 2 days              Patient currently is not medically stable to d/c.  Persistent nausea, clinically dehydrated, n.p.o. for endoscopy      Time Spent in minutes 35 minutes  Procedures:  None  Consultants:   GI General surgery  Antimicrobials:   Anti-infectives (From admission, onward)   Start     Dose/Rate Route Frequency Ordered Stop   08/10/20 1000  cefTRIAXone (ROCEPHIN) 2  g in sodium chloride 0.9 % 100 mL IVPB        2 g 200 mL/hr over 30 Minutes Intravenous Every 24 hours 08/09/20 2226     08/09/20 2030  cefTRIAXone (ROCEPHIN) 1 g in sodium chloride 0.9 % 100 mL IVPB        1 g 200 mL/hr over 30 Minutes Intravenous  Once 08/09/20 2016 08/09/20 2329          Medications  Scheduled Meds: . atorvastatin  40 mg Oral Daily  . pantoprazole (PROTONIX) IV  40 mg Intravenous QHS  . sodium chloride flush  3 mL Intravenous Q12H   Continuous Infusions: . cefTRIAXone (ROCEPHIN)  IV 2 g (08/10/20 0918)   PRN Meds:.acetaminophen, albuterol, morphine injection, morphine injection, ondansetron **OR** ondansetron (ZOFRAN) IV      Subjective:   [name] was seen and examined today.  Appears to be fatigued, nauseous.  No active vomiting.  No fevers or chills.  Periumbilical abdominal pain. No acute events overnight  Objective:   Vitals:   08/10/20 0300 08/10/20 0435 08/10/20 0519 08/10/20 1336  BP: 114/68 137/74 (!) 157/84 138/75  Pulse: 65 67 79 73  Resp: 13 12 18    Temp:   98.3 F (36.8 C) 98.2 F (36.8 C)  TempSrc:   Oral Oral  SpO2: 98% 99% 100% 98%  Weight:      Height:        Intake/Output Summary (Last 24 hours) at 08/10/2020 1348 Last data filed at 08/10/2020 1336 Gross per 24 hour  Intake 2100.73 ml  Output 0 ml  Net 2100.73 ml     Wt Readings from Last 3 Encounters:  08/09/20 110.2 kg  07/04/20 110.2 kg  06/01/20 111.6 kg     Exam  General: Alert and oriented x 3, NAD, ill-appearing  Cardiovascular: S1 S2 auscultated, no murmurs, RRR  Respiratory: Clear to auscultation bilaterally, no wheezing, rales or rhonchi  Gastrointestinal: Soft, distention with mild diffuse TTP, no peritonitis, no rebound or guarding, + BS   Ext: no pedal edema bilaterally  Neuro: AAOx3, Cr N's II- XII. Strength 5/5 upper and lower extremities bilaterally  Musculoskeletal: No digital cyanosis, clubbing  Skin: No rashes  Psych: Normal  affect and demeanor, alert and oriented x3    Data Reviewed:  I have personally reviewed following labs and imaging studies  Micro Results Recent Results (from the past 240 hour(s))  SARS Coronavirus 2 by RT PCR (hospital order, performed in [facility] hospital lab) Nasopharyngeal Nasopharyngeal Swab     Status: None   Collection Time: 08/09/20  8:59 PM   Specimen: Nasopharyngeal Swab  Result Value  Ref Range Status   SARS Coronavirus 2 NEGATIVE NEGATIVE Final    Comment: (NOTE) SARS-CoV-2 target nucleic acids are NOT DETECTED.  The SARS-CoV-2 RNA is generally detectable in upper and lower respiratory specimens during the acute phase of infection. The lowest concentration of SARS-CoV-2 viral copies this assay can detect is 250 copies / mL. A negative result does not preclude SARS-CoV-2 infection and should not be used as the sole basis for treatment or other patient management decisions.  A negative result may occur with improper specimen collection / handling, submission of specimen other than nasopharyngeal swab, presence of viral mutation(s) within the areas targeted by this assay, and inadequate number of viral copies (<250 copies / mL). A negative result must be combined with clinical observations, patient history, and epidemiological information.  Fact Sheet for Patients:   StrictlyIdeas.no  Fact Sheet for Healthcare Providers: BankingDealers.co.za  This test is not yet approved or  cleared by the [place] FDA and has been authorized for detection and/or diagnosis of SARS-CoV-2 by FDA under an Emergency Use Authorization (EUA).  This EUA will remain in effect (meaning this test can be used) for the duration of the COVID-19 declaration under Section 564(b)(1) of the Act, 21 U.S.C. section 360bbb-3(b)(1), unless the authorization is terminated or revoked sooner.  Performed at [facility], [place]  [address], [place], [place] [address]     Radiology Reports DG Abd 1 View  Result Date: 08/10/2020 CLINICAL DATA:  Small-bowel obstruction, not feeling better EXAM: ABDOMEN - 1 VIEW COMPARISON:  Portable exam 0838 hours compared to 09/03/2017 FINDINGS: Excreted contrast material within renal collecting systems and bladder. Decreased small bowel distension since prior study. Increased colonic gas. No bowel wall thickening. Surgical clips RIGHT upper quadrant likely reflect cholecystectomy. Bones demineralized. IMPRESSION: Decreased small bowel distension since prior study Electronically Signed   By: Lavonia Dana M.D.   On: 08/10/2020 08:54   CT ABDOMEN PELVIS W CONTRAST  Result Date: 08/09/2020 CLINICAL DATA:  Abdominal distension EXAM: CT ABDOMEN AND PELVIS WITH CONTRAST TECHNIQUE: Multidetector CT imaging of the abdomen and pelvis was performed using the standard protocol following bolus administration of intravenous contrast. CONTRAST:  11mL OMNIPAQUE IOHEXOL 300 MG/ML  SOLN COMPARISON:  05/17/2019 FINDINGS: Lower chest: No acute abnormality. Hepatobiliary: No focal liver abnormality is seen. Status post interval cholecystectomy. No biliary dilatation. Pancreas: Unremarkable. Spleen: Unremarkable. Adrenals/Urinary Tract: Adrenals are unremarkable. Kidneys and partially distended bladder are unremarkable. Stomach/Bowel: Stomach is within normal limits. There are few mildly dilated loops of small bowel with air-fluid levels. Distal colonic diverticulosis. Vascular/Lymphatic: Aortic atherosclerosis. No enlarged lymph nodes identified. Reproductive: Status post hysterectomy. No adnexal masses. Other: Small volume free fluid in the pelvis presumably related to above. No acute abnormality of the abdominal wall. Musculoskeletal: No acute osseous abnormality. IMPRESSION: Few mildly dilated loops of small bowel in the mid abdomen with air-fluid levels suggesting low-grade obstruction. Interval cholecystectomy.  Colonic diverticulosis. Electronically Signed   By: Macy Mis M.D.   On: 08/09/2020 19:47    Lab Data:  CBC: Recent Labs  Lab 08/09/20 1144 08/10/20 0549  WBC 12.1* 10.3  NEUTROABS  --  7.6  HGB 12.3 10.3*  HCT 44.0 34.3*  MCV 88.7 82.5  PLT 215 902   Basic Metabolic Panel: Recent Labs  Lab 08/09/20 1144 08/10/20 0549  NA 141 141  K 3.6 3.1*  CL 103 105  CO2 22 27  GLUCOSE 107* 108*  BUN 22 23  CREATININE 0.84 0.97  CALCIUM 8.8* 8.3*  MG  --  2.3  2.3  PHOS  --  2.8   GFR: Estimated Creatinine Clearance: 79.3 mL/min (by C-G formula based on SCr of 0.97 mg/dL). Liver Function Tests: Recent Labs  Lab 08/09/20 1144 08/10/20 0549  AST 20 54*  ALT 9 22  ALKPHOS 119 116  BILITOT 0.4 0.6  PROT 6.6 6.1*  ALBUMIN 3.3* 3.2*   Recent Labs  Lab 08/09/20 1144  LIPASE 18   No results for input(s): AMMONIA in the last 168 hours. Coagulation Profile: No results for input(s): INR, PROTIME in the last 168 hours. Cardiac Enzymes: No results for input(s): CKTOTAL, CKMB, CKMBINDEX, TROPONINI in the last 168 hours. BNP (last 3 results) No results for input(s): PROBNP in the last 8760 hours. HbA1C: No results for input(s): HGBA1C in the last 72 hours. CBG: No results for input(s): GLUCAP in the last 168 hours. Lipid Profile: No results for input(s): CHOL, HDL, LDLCALC, TRIG, CHOLHDL, LDLDIRECT in the last 72 hours. Thyroid Function Tests: Recent Labs    08/10/20 0549  TSH 4.591*   Anemia Panel: No results for input(s): VITAMINB12, FOLATE, FERRITIN, TIBC, IRON, RETICCTPCT in the last 72 hours. Urine analysis:    Component Value Date/Time   COLORURINE AMBER (A) 08/09/2020 1842   APPEARANCEUR CLOUDY (A) 08/09/2020 1842   APPEARANCEUR Cloudy 09/18/2012 2000   LABSPEC 1.028 08/09/2020 1842   LABSPEC 1.012 09/18/2012 2000   PHURINE 5.0 08/09/2020 1842   GLUCOSEU NEGATIVE 08/09/2020 1842   GLUCOSEU Negative 09/18/2012 2000   HGBUR MODERATE (A) 08/09/2020  1842   BILIRUBINUR NEGATIVE 08/09/2020 1842   BILIRUBINUR negative 04/08/2019 1103   BILIRUBINUR Negative 09/18/2012 2000   KETONESUR 5 (A) 08/09/2020 1842   PROTEINUR 30 (A) 08/09/2020 1842   UROBILINOGEN 0.2 04/08/2019 1103   UROBILINOGEN 0.2 05/25/2017 1743   NITRITE POSITIVE (A) 08/09/2020 1842   LEUKOCYTESUR LARGE (A) 08/09/2020 1842   LEUKOCYTESUR Trace 09/18/2012 2000     Celester Lech M.D. [facility] 08/10/2020, 1:48 PM   Call night coverage person covering after 7pm

## 2020-08-10 NOTE — H&P (View-Only) (Signed)
Referring Provider:  [facility]         Primary Care Physician:  [name], MD Primary Gastroenterologist: Denzil Magnuson, MD            We were asked to see this patient for:   Nausea, vomiting and abdominal pain                ASSESSMENT /  PLAN    69 year old female with PMH significant for iron deficiency anemia, PUD/partial pyloric stenosis status post balloon dilation, hypertension, CHF, COPD, CKD, colon polyps, appendectomy, hysterectomy, cholecystectomy  # Nausea, vomiting, recent loose stools, generalized abdominal pain / partial small bowel obstruction by  CT scan.  --partial bowel obstruction? Ileus from viral enteritis?  --NPO for now --Currently not vomiting so holding off NG decompression.  --General Surgery will be evaluating --Stool studies if has significant diarrhea  # Hx of PUD / pyloric stenosis, s/p dilation in July 2021.  --No gastric distention on imaging so GOO related to pyloric stenosis seems unlikely at this point.  --Continue IV PPI   # Hx, normocytic but recently found to be IDA --Already evaluated by EGD / colonoscopy in July 2021 with findings of gastritis with hemorrhage / PUD / colon polyps.  --Continue PPI --Hgb is stable at 10.3 ( baseline). The 12.3 yesterday was likely inaccurate from hemoconcentration in setting of vomiting.    HPI:    Chief Complaint: nausea, vomiting and abdominal pain.   Morgan [name] is a 69 y.o. female not [place] for history of chronic iron deficiency anemia and PUD / partial pyloric stenosis.  She was hospitalized in June of this year for chronic cholecystitis, underwent cholecystectomy.  She was seen in the office mid June for hospital follow-up and evaluation of iron deficiency anemia. She was scheduled for colonoscopy as well as an EGD ( evaluate IDA and also pyloric stenosis).  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  Several colon polyps  were removed. She underwent dilation of pyloric stenosis.   Patient presented to ED yesterday with nausea, vomiting and abdominal pain. She has continued to have frequent nausea without vomiting since EGD with dilation of pylorus in July. However, 1.5 weeks ago she developed increasing nausea with associated vomiting and mid generalized abdominal pain. The pain is nearly constant as is nausea but vomiting is mainly just associated with trying to eat. She has post-prandial vomiting about 50 % of the time. Liquids are tolerated much better than solids. No fevers. She had diarrhea a couple of days ago and then a couple of episodes of loose stool yesterday. No BMs today. Recently resumed Remeron after having been switched to a different medication( which caused nightmares)   In ED, her CMP, lipase were unremarkable. WBC 12.1. CT scan  abd/ pelvis with contrast remarkable for a few mildly dilated loops of small bowel in mid abdomen with air fluid levels. No gastric distention.    PREVIOUS ENDOSCOPIC EVALUATIONS / GI STUDIES :  8/224/21 CT scan  abd/ pelvis with contrast -Few mildly dilated loops of small bowel in the mid abdomen with air-fluid levels suggesting low-grade obstruction.  07/04/20 EGD LA Grade B reflux esophagitis with no bleeding. - Small hiatal hernia. - Gastritis with hemorrhage. Biopsied. - Non-bleeding gastric ulcer with a clean ulcer base (Forrest Class III). Biopsied. - Gastric stenosis was found at the pylorus. Dilated. - Rule out malignancy, duodenal mass. Biopsied. - Normal second portion of  the duodenum.  07/04/20 Colonoscopy Three 1 to 2 mm polyps in the sigmoid colon and in the cecum, removed with a cold biopsy forceps. Resected and retrieved. - Two 4 to 6 mm polyps in the sigmoid colon, removed with a cold snare. Resected and 1 of the 2 polyps retrieved. - Moderate diverticulosis in the sigmoid colon, in the descending colon and in the ascending colon. There was  evidence of an impacted diverticulum. - Non-bleeding internal hemorrhoids.  FINAL MICROSCOPIC DIAGNOSIS:   A. PYLORIC CHANNEL, BIOPSY:  - Gastric antral mucosa with nonspecific reactive gastropathy  - Focal intestinal metaplasia, negative for dysplasia or carcinoma   B. GASTRIC, RANDOM, BIOPSY:  - Gastric antral and oxyntic mucosa with nonspecific reactive  gastropathy  - Warthin Starry stain is negative for Helicobacter pylori   C. CECAL POLYP, SIGMOID COLON POLYPS, POLYPECTOMY:  - Tubular adenoma without high-grade dysplasia or malignancy  - Hyperplastic polyp(s)   04/21/20 EGD Normal GE junction. - Chronic, benign appearing pyloric stenosis. Biopsied and dilated to 15mm with CRE balloon. - Mild, non-specific gastritis was biopsied to check for H. pylori.    Past Medical History:  Diagnosis Date  . Allergy   . Anemia   . Anxiety   . Arthritis   . Depression   . GERD (gastroesophageal reflux disease)   . Hypertension   . MVP (mitral valve prolapse)     Past Surgical History:  Procedure Laterality Date  . ABDOMINAL HYSTERECTOMY    . APPENDECTOMY    . BALLOON DILATION N/A 04/21/2020   Procedure: BALLOON DILATION;  Surgeon: Milus Banister, MD;  Location: [facility];  Service: Endoscopy;  Laterality: N/A;  pyloric/ GI  . BALLOON DILATION N/A 07/04/2020   Procedure: BALLOON DILATION;  Surgeon: Mauri Pole, MD;  Location: [facility];  Service: Endoscopy;  Laterality: N/A;  . BIOPSY  04/21/2020   Procedure: BIOPSY;  Surgeon: Milus Banister, MD;  Location: [facility];  Service: Endoscopy;;  . BIOPSY  07/04/2020   Procedure: BIOPSY;  Surgeon: Mauri Pole, MD;  Location: [facility];  Service: Endoscopy;;  EGD and COLON  . BREAST SURGERY Right 1988   tumor removal  . CHOLECYSTECTOMY N/A 05/18/2020   Procedure: LAPAROSCOPIC CHOLECYSTECTOMY;  Surgeon: Johnathan Hausen, MD;  Location: [facility];  Service: General;  Laterality: N/A;  . COLONOSCOPY WITH PROPOFOL  N/A 07/04/2020   Procedure: COLONOSCOPY WITH PROPOFOL;  Surgeon: Mauri Pole, MD;  Location: [facility];  Service: Endoscopy;  Laterality: N/A;  . ESOPHAGOGASTRODUODENOSCOPY (EGD) WITH PROPOFOL N/A 04/21/2020   Procedure: ESOPHAGOGASTRODUODENOSCOPY (EGD) WITH PROPOFOL;  Surgeon: Milus Banister, MD;  Location: [facility];  Service: Endoscopy;  Laterality: N/A;  . ESOPHAGOGASTRODUODENOSCOPY (EGD) WITH PROPOFOL N/A 07/04/2020   Procedure: ESOPHAGOGASTRODUODENOSCOPY (EGD) WITH PROPOFOL;  Surgeon: Mauri Pole, MD;  Location: [facility];  Service: Endoscopy;  Laterality: N/A;  . KNEE SURGERY  2005   2 times left  . POLYPECTOMY  07/04/2020   Procedure: POLYPECTOMY;  Surgeon: Mauri Pole, MD;  Location: [facility];  Service: Endoscopy;;  . TUBAL LIGATION      Prior to Admission medications   Medication Sig Start Date End Date Taking? Authorizing Provider  ARIPiprazole (ABILIFY) 2 MG tablet Take 2 mg by mouth daily. 07/14/20  Yes [provider]  aspirin 81 MG EC tablet Take 81 mg by mouth daily. daily   Yes [provider]  aspirin-acetaminophen-caffeine (EXCEDRIN MIGRAINE) [id number] MG tablet Take 2 tablets by mouth every 6 (six)  hours as needed for headache.   Yes [provider]  atorvastatin (LIPITOR) 40 MG tablet Take 40 mg by mouth daily.    Yes [provider]  carvedilol (COREG) 3.125 MG tablet Take 3.125 mg by mouth in the morning and at bedtime.   Yes [provider]  DEXILANT 60 MG capsule TAKE 1 CAPSULE BY MOUTH EVERY DAY Patient taking differently: Take 60 mg by mouth daily.  07/20/20  Yes Nandigam, Venia Minks, MD  esomeprazole (NEXIUM) 40 MG capsule Take 1 capsule by mouth twice daily. Patient taking differently: Take 40 mg by mouth 2 (two) times daily.  03/30/20  Yes Esterwood, Amy S, PA-C  Ferrous Sulfate (IRON) 325 (65 Fe) MG TABS Take 1 tablet (325 mg total) by mouth daily. 04/09/19  Yes [name], MD    FLUoxetine (PROZAC) 40 MG capsule Take 40 mg by mouth daily.   Yes [provider]  furosemide (LASIX) 40 MG tablet HOLD UNTIL seen by your doctor Patient taking differently: Take 40 mg by mouth daily.  02/19/19  Yes Oretha Milch D, MD  gabapentin (NEURONTIN) 400 MG capsule Take 400 mg by mouth 3 (three) times daily.  10/20/18  Yes [provider]  hydrOXYzine (ATARAX/VISTARIL) 25 MG tablet Take 12.5-25 mg by mouth as needed for anxiety.    Yes [provider]  linaclotide (LINZESS) 145 MCG CAPS capsule Take 1 capsule (145 mcg total) by mouth daily before breakfast. Patient taking differently: Take 145 mcg by mouth as needed (constipation).  05/04/20  Yes Nandigam, Venia Minks, MD  mirtazapine (REMERON) 45 MG tablet Take 45 mg by mouth at bedtime.    Yes [provider]  ondansetron (ZOFRAN-ODT) 4 MG disintegrating tablet Take 1 tablet (4 mg total) by mouth every 8 (eight) hours as needed for nausea or vomiting. 06/30/20  Yes Willia Craze, NP  OXYGEN Inhale into the lungs. 02 at bedtime   Yes [provider]  sucralfate (CARAFATE) 1 g tablet TAKE 1 TABLET (1 G TOTAL) BY MOUTH 4 (FOUR) TIMES DAILY - WITH MEALS AND AT BEDTIME. 07/26/20  Yes Nandigam, Kavitha V, MD  albuterol (VENTOLIN HFA) 108 (90 Base) MCG/ACT inhaler TAKE 2 PUFFS BY MOUTH EVERY 6 HOURS AS NEEDED FOR WHEEZE OR SHORTNESS OF BREATH Patient not taking: Reported on 06/21/2020 02/22/20   [name], MD  colestipol (COLESTID) 1 g tablet TAKE 1 TABLET (1 G TOTAL) BY MOUTH 2 (TWO) TIMES DAILY. Patient not taking: Reported on 08/09/2020 07/20/19   Mauri Pole, MD    Current Facility-Administered Medications  Medication Dose Route Frequency Provider Last Rate Last Admin  . 0.9 %  sodium chloride infusion   Intravenous Continuous Toy Baker, MD 75 mL/hr at 08/09/20 2342 New Bag at 08/09/20 2342  . albuterol (PROVENTIL) (2.5 MG/3ML) 0.083% nebulizer solution 2.5 mg  2.5 mg  Nebulization Q2H PRN Doutova, Anastassia, MD      . atorvastatin (LIPITOR) tablet 40 mg  40 mg Oral Daily Doutova, Anastassia, MD      . cefTRIAXone (ROCEPHIN) 2 g in sodium chloride 0.9 % 100 mL IVPB  2 g Intravenous Q24H Doutova, Anastassia, MD      . morphine 2 MG/ML injection 2 mg  2 mg Intravenous Q2H PRN Toy Baker, MD   2 mg at 08/10/20 0340  . morphine 2 MG/ML injection 2-4 mg  2-4 mg Intravenous Q4H PRN Doutova, Anastassia, MD      . ondansetron (ZOFRAN) tablet 4 mg  4 mg Oral Q6H PRN Toy Baker, MD       Or  . ondansetron (ZOFRAN) injection 4 mg  4 mg Intravenous Q6H PRN Doutova, Anastassia, MD   4 mg at 08/10/20 0340  . pantoprazole (PROTONIX) injection 40 mg  40 mg Intravenous QHS Doutova, Nyoka Lint, MD   40 mg at 08/09/20 2351  . sodium chloride flush (NS) 0.9 % injection 3 mL  3 mL Intravenous Q12H Toy Baker, MD        Allergies as of 08/09/2020 - Review Complete 08/09/2020  Allergen Reaction Noted  . Meperidine Hives and Anaphylaxis 04/28/2013  . Strawberry extract Swelling 08/09/2020  . Strawberry (diagnostic) Hives 08/26/2017  . Wasp venom Hives 04/11/2020  . Wasp venom protein  08/09/2020    Family History  Problem Relation Age of Onset  . Breast cancer Mother 52  . Ovarian cancer Mother 73  . Brain cancer Mother   . Hypertension Father   . Hyperlipidemia Father   . Heart disease Father   . Breast cancer Maternal Grandmother 4  . Diabetes Paternal Grandmother   . Breast cancer Paternal Grandmother 72  . Cancer Paternal Grandfather   . Breast cancer Maternal Aunt 60  . Breast cancer Maternal Aunt 60    Social History   Socioeconomic History  . Marital status: Single    Spouse name: Not on file  . Number of children: 2  . Years of education: Not on file  . Highest education level: Not on file  Occupational History  . Not on file  Tobacco Use  . Smoking status: Former Smoker    Packs/day: 1.00    Years: 15.00    Pack  years: 15.00    Types: Cigarettes    Quit date: 09/12/2017    Years since quitting: 2.9  . Smokeless tobacco: Never Used  Vaping Use  . Vaping Use: Never used  Substance and Sexual Activity  . Alcohol use: Not Currently  . Drug use: No  . Sexual activity: Not Currently  Other Topics Concern  . 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 Difficulty of Paying Living Expenses: Not on file  Food Insecurity:   . Worried About Charity fundraiser in the Last Year: Not on file  . Ran Out of Food in the Last Year: Not on file  Transportation Needs:   . Lack of Transportation (Medical): Not on file  . Lack of Transportation (Non-Medical): Not on file  Physical Activity:   . Days of Exercise per Week: Not on file  . Minutes of Exercise per Session: Not on file  Stress:   . Feeling of Stress : Not on file  Social Connections:   . Frequency of Communication with Friends and Family: Not on file  . Frequency of Social Gatherings with Friends and Family: Not on file  . Attends Religious Services: Not on file  . Active Member of Clubs or Organizations: Not on  file  . Attends Archivist Meetings: Not on file  . Marital Status: Not on file  Intimate Partner Violence:   . Fear of Current or Ex-Partner: Not on file  . Emotionally Abused: Not on file  . Physically Abused: Not on file  . Sexually Abused: Not on file    Review of Systems: All systems reviewed and negative except where noted in HPI.  Physical  Exam: Vital signs in last 24 hours: Temp:  [97.6 F (36.4 C)-98.3 F (36.8 C)] 98.3 F (36.8 C) (08/25 0519) Pulse Rate:  [65-79] 79 (08/25 0519) Resp:  [12-18] 18 (08/25 0519) BP: (114-185)/(60-101) 157/84 (08/25 0519) SpO2:  [97 %-100 %] 100 % (08/25 0519) Last BM Date: 08/10/20 General:   Alert, well-developed,  female in NAD Psych:  Pleasant, cooperative. Normal mood and affect. Eyes:  Pupils equal, sclera clear, no icterus.   Conjunctiva pink. Ears:  Normal auditory acuity. Nose:  No deformity, discharge,  or lesions. Neck:  Supple; no masses Lungs:  Clear throughout to auscultation.   No wheezes, crackles, or rhonchi.  Heart:  Regular rate and rhythm; no murmurs, no lower extremity edema Abdomen:  Soft, mild mid abdominal tenderness.  BS active, no palp mass   Rectal:  Deferred  Msk:  Symmetrical without gross deformities. . Neurologic:  Alert and  oriented x4;  grossly normal neurologically. Skin:  Intact without significant lesions or rashes.   Intake/Output from previous day: 08/24 0701 - 08/25 0700 In: 1666.3 [I.V.:466.3; IV Piggyback:1200] Out: -  Intake/Output this shift: No intake/output data recorded.  Lab Results: Recent Labs    08/09/20 1144 08/10/20 0549  WBC 12.1* 10.3  HGB 12.3 10.3*  HCT 44.0 34.3*  PLT 215 299   BMET Recent Labs    08/09/20 1144 08/10/20 0549  NA 141 141  K 3.6 3.1*  CL 103 105  CO2 22 27  GLUCOSE 107* 108*  BUN 22 23  CREATININE 0.84 0.97  CALCIUM 8.8* 8.3*   LFT Recent Labs    08/10/20 0549  PROT 6.1*  ALBUMIN 3.2*  AST 54*  ALT 22  ALKPHOS 116  BILITOT 0.6   PT/INR No results for input(s): LABPROT, INR in the last 72 hours. Hepatitis Panel No results for input(s): HEPBSAG, HCVAB, HEPAIGM, HEPBIGM in the last 72 hours.   . CBC Latest Ref Rng & Units 08/10/2020 08/09/2020 05/20/2020  WBC 4.0 - 10.5 K/uL 10.3 12.1(H) 6.6  Hemoglobin 12.0 - 15.0 g/dL 10.3(L) 12.3 9.3(L)  Hematocrit 36 - 46 % 34.3(L) 44.0  31.2(L)  Platelets 150 - 400 K/uL 299 215 199    . CMP Latest Ref Rng & Units 08/10/2020 08/09/2020 05/20/2020  Glucose 70 - 99 mg/dL 108(H) 107(H) 86  BUN 8 - 23 mg/dL 23 22 26(H)  Creatinine 0.44 - 1.00 mg/dL 0.97 0.84 0.89  Sodium 135 - 145 mmol/L 141 141 144  Potassium 3.5 - 5.1 mmol/L 3.1(L) 3.6 3.5  Chloride 98 - 111 mmol/L 105 103 104  CO2 22 - 32 mmol/L 27 22 29   Calcium 8.9 - 10.3 mg/dL 8.3(L) 8.8(L) 8.5(L)  Total Protein 6.5 - 8.1 g/dL 6.1(L) 6.6 5.7(L)  Total Bilirubin 0.3 - 1.2 mg/dL 0.6 0.4 0.5  Alkaline Phos 38 - 126 U/L 116 119 80  AST 15 - 41 U/L 54(H) 20 19  ALT 0 - 44 U/L 22 9 17    Studies/Results: DG Abd 1 View  Result Date: 08/10/2020 CLINICAL DATA:  Small-bowel obstruction, not feeling better  EXAM: ABDOMEN - 1 VIEW COMPARISON:  Portable exam 0838 hours compared to 09/03/2017 FINDINGS: Excreted contrast material within renal collecting systems and bladder. Decreased small bowel distension since prior study. Increased colonic gas. No bowel wall thickening. Surgical clips RIGHT upper quadrant likely reflect cholecystectomy. Bones demineralized. IMPRESSION: Decreased small bowel distension since prior study Electronically Signed   By: Lavonia Dana M.D.   On: 08/10/2020 08:54   CT ABDOMEN PELVIS W CONTRAST  Result Date: 08/09/2020 CLINICAL DATA:  Abdominal distension EXAM: CT ABDOMEN AND PELVIS WITH CONTRAST TECHNIQUE: Multidetector CT imaging of the abdomen and pelvis was performed using the standard protocol following bolus administration of intravenous contrast. CONTRAST:  135mL OMNIPAQUE IOHEXOL 300 MG/ML  SOLN COMPARISON:  05/17/2019 FINDINGS: Lower chest: No acute abnormality. Hepatobiliary: No focal liver abnormality is seen. Status post interval cholecystectomy. No biliary dilatation. Pancreas: Unremarkable. Spleen: Unremarkable. Adrenals/Urinary Tract: Adrenals are unremarkable. Kidneys and partially distended bladder are unremarkable. Stomach/Bowel: Stomach is within  normal limits. There are few mildly dilated loops of small bowel with air-fluid levels. Distal colonic diverticulosis. Vascular/Lymphatic: Aortic atherosclerosis. No enlarged lymph nodes identified. Reproductive: Status post hysterectomy. No adnexal masses. Other: Small volume free fluid in the pelvis presumably related to above. No acute abnormality of the abdominal wall. Musculoskeletal: No acute osseous abnormality. IMPRESSION: Few mildly dilated loops of small bowel in the mid abdomen with air-fluid levels suggesting low-grade obstruction. Interval cholecystectomy. Colonic diverticulosis. Electronically Signed   By: Macy Mis M.D.   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  Therefore, I agree with the diagnoses recorded above. In addition,  I have personally interviewed and examined the patient and have personally reviewed any abdominal/pelvic CT scan images. I also reviewed recent EGD and colonoscopy report by Dr. Silverio Decamp as well as her most recent office note.  My additional thoughts are as follows:  This patient has multiple chronic digestive symptoms including fairly generalized abdominal pain as well as nausea with intermittent vomiting.  Clinical course over many months suggest there is probably an underlying functional constipation contributing to some of the symptoms.  She was then having more focal  right upper quadrant pain which resolved after cholecystitis.  She tells me she really just has not felt well for months, and is not clear to what extent the pyloric stenosis is contributing.  There was a prepyloric ulcer from concomitant use of low-dose aspirin and Excedrin Migraine. In recent days I think she probably got a superimposed acute gastroenteritis accounting for her more severe nausea, new onset watery diarrhea and the CT scan findings.   Our plan is for an upper endoscopy with probable balloon dilation of the pyloric stenosis tomorrow.  I am hopeful that will help some of her nausea and vomiting, but have told her if she will have some persistent symptoms even after discharge due to the chronic  and complex nature of her condition. She was agreeable to an upper endoscopy after discussion of procedure and risks.  The benefits and risks of the planned procedure were described in detail with the patient or (when appropriate) their health care proxy.  Risks were outlined as including, but not limited to, bleeding, infection, perforation, adverse medication reaction leading to cardiac or pulmonary decompensation, pancreatitis (if ERCP).  The limitation of incomplete mucosal visualization was also discussed.  No guarantees or warranties were given.  Patient at increased risk for cardiopulmonary complications of procedure due to medical comorbidities.  Stool studies ordered for infectious pathogens.  Sips of water and ice chips only today, n.p.o. after midnight.  [name] Office:[phone]

## 2020-08-11 ENCOUNTER — Inpatient Hospital Stay (HOSPITAL_COMMUNITY): Payer: Medicare HMO | Admitting: Certified Registered"

## 2020-08-11 ENCOUNTER — Encounter (HOSPITAL_COMMUNITY)
Admission: EM | Disposition: A | Discharge status: Home / Self Care | Payer: Self-pay | Source: Home / Self Care | Attending: Internal Medicine

## 2020-08-11 ENCOUNTER — Encounter (HOSPITAL_COMMUNITY): Payer: Self-pay | Admitting: Internal Medicine

## 2020-08-11 HISTORY — PX: ESOPHAGOGASTRODUODENOSCOPY (EGD) WITH PROPOFOL: SHX5813

## 2020-08-11 HISTORY — PX: ESOPHAGEAL DILATION: SHX303

## 2020-08-11 LAB — BASIC METABOLIC PANEL
Anion gap: 8 (ref 5–15)
BUN: 16 mg/dL (ref 8–23)
CO2: 27 mmol/L (ref 22–32)
Calcium: 7.9 mg/dL — ABNORMAL LOW (ref 8.9–10.3)
Chloride: 105 mmol/L (ref 98–111)
Creatinine, Ser: 0.77 mg/dL (ref 0.44–1.00)
GFR calc Af Amer: >60 mL/min (ref >60)
GFR calc non Af Amer: >60 mL/min (ref >60)
Glucose, Bld: 102 mg/dL — ABNORMAL HIGH (ref 70–99)
Potassium: 3.2 mmol/L — ABNORMAL LOW (ref 3.5–5.1)
Sodium: 140 mmol/L (ref 135–145)

## 2020-08-11 LAB — CBC
HCT: 30.8 % — ABNORMAL LOW (ref 36.0–46.0)
Hemoglobin: 9 g/dL — ABNORMAL LOW (ref 12.0–15.0)
MCH: 24.5 pg — ABNORMAL LOW (ref 26.0–34.0)
MCHC: 29.2 g/dL — ABNORMAL LOW (ref 30.0–36.0)
MCV: 83.9 fL (ref 80.0–100.0)
Platelets: 275 10*3/uL (ref 150–400)
RBC: 3.67 MIL/uL — ABNORMAL LOW (ref 3.87–5.11)
RDW: 16.3 % — ABNORMAL HIGH (ref 11.5–15.5)
WBC: 7.9 10*3/uL (ref 4.0–10.5)
nRBC: 0 % (ref 0.0–0.2)

## 2020-08-11 LAB — MAGNESIUM: Magnesium: 2.2 mg/dL (ref 1.7–2.4)

## 2020-08-11 SURGERY — ESOPHAGOGASTRODUODENOSCOPY (EGD) WITH PROPOFOL
Anesthesia: General

## 2020-08-11 MED ORDER — GABAPENTIN 400 MG PO CAPS
400.0000 mg | ORAL_CAPSULE | Freq: Three times a day (TID) | ORAL | Status: DC
  Administered 2020-08-11 – 2020-08-18 (×21): 400 mg via ORAL
  Filled 2020-08-11 (×21): qty 1

## 2020-08-11 MED ORDER — SUCCINYLCHOLINE CHLORIDE 200 MG/10ML IV SOSY
PREFILLED_SYRINGE | INTRAVENOUS | Status: DC | PRN
  Administered 2020-08-11: 100 mg via INTRAVENOUS

## 2020-08-11 MED ORDER — MIRTAZAPINE 15 MG PO TABS
45.0000 mg | ORAL_TABLET | Freq: Every day | ORAL | Status: DC
  Administered 2020-08-11 – 2020-08-17 (×7): 45 mg via ORAL
  Filled 2020-08-11 (×7): qty 3

## 2020-08-11 MED ORDER — ONDANSETRON HCL 4 MG/2ML IJ SOLN
INTRAMUSCULAR | Status: DC | PRN
  Administered 2020-08-11: 4 mg via INTRAVENOUS

## 2020-08-11 MED ORDER — CARVEDILOL 3.125 MG PO TABS
3.1250 mg | ORAL_TABLET | Freq: Two times a day (BID) | ORAL | Status: DC
  Administered 2020-08-11 – 2020-08-18 (×14): 3.125 mg via ORAL
  Filled 2020-08-11 (×14): qty 1

## 2020-08-11 MED ORDER — PROPOFOL 10 MG/ML IV BOLUS
INTRAVENOUS | Status: DC | PRN
  Administered 2020-08-11: 150 mg via INTRAVENOUS

## 2020-08-11 MED ORDER — FLUOXETINE HCL 20 MG PO CAPS
40.0000 mg | ORAL_CAPSULE | Freq: Every day | ORAL | Status: DC
  Administered 2020-08-11 – 2020-08-18 (×8): 40 mg via ORAL
  Filled 2020-08-11 (×8): qty 2

## 2020-08-11 MED ORDER — ARIPIPRAZOLE 2 MG PO TABS
2.0000 mg | ORAL_TABLET | Freq: Every day | ORAL | Status: DC
  Administered 2020-08-11 – 2020-08-18 (×8): 2 mg via ORAL
  Filled 2020-08-11 (×8): qty 1

## 2020-08-11 MED ORDER — LIDOCAINE 2% (20 MG/ML) 5 ML SYRINGE
INTRAMUSCULAR | Status: DC | PRN
  Administered 2020-08-11: 40 mg via INTRAVENOUS

## 2020-08-11 MED ORDER — SUCRALFATE 1 G PO TABS
1.0000 g | ORAL_TABLET | Freq: Three times a day (TID) | ORAL | Status: DC
  Administered 2020-08-11 – 2020-08-12 (×4): 1 g via ORAL
  Filled 2020-08-11 (×4): qty 1

## 2020-08-11 MED ORDER — LACTATED RINGERS IV SOLN: INTRAVENOUS | Status: DC

## 2020-08-11 MED ORDER — HYDRALAZINE HCL 20 MG/ML IJ SOLN: 10.0000 mg | Freq: Four times a day (QID) | INTRAMUSCULAR | Status: DC | PRN

## 2020-08-11 SURGICAL SUPPLY — 15 items

## 2020-08-11 NOTE — Anesthesia Procedure Notes (Signed)
Procedure Name: Intubation Date/Time: 08/11/2020 11:10 AM Performed by: Cynda Familia, CRNA Pre-anesthesia Checklist: Patient identified, Emergency Drugs available, Suction available and Patient being monitored Patient Re-evaluated:Patient Re-evaluated prior to induction Oxygen Delivery Method: Circle System Utilized Preoxygenation: Pre-oxygenation with 100% oxygen Induction Type: IV induction, Rapid sequence and Cricoid Pressure applied Ventilation: Mask ventilation without difficulty Laryngoscope Size: Miller and 2 Grade View: Grade I Tube type: Oral Number of attempts: 1 Airway Equipment and Method: Stylet Placement Confirmation: ETT inserted through vocal cords under direct vision,  positive ETCO2 and breath sounds checked- equal and bilateral Secured at: 20 cm Tube secured with: Tape Dental Injury: Teeth and Oropharynx as per pre-operative assessment  Comments: Smooth RSI by Jackson--intubation AM CRNA atraumatic-- mouth as preop no teeth-- bilat BS Glennon Mac

## 2020-08-11 NOTE — Anesthesia Postprocedure Evaluation (Signed)
Anesthesia Post Note  Patient: Morgan [name]  Procedure(s) Performed: ESOPHAGOGASTRODUODENOSCOPY (EGD) WITH PROPOFOL (N/A ) PYLORIC DILATION     Patient location during evaluation: Endoscopy Anesthesia Type: General Level of consciousness: awake and alert, patient cooperative and oriented Pain management: pain level controlled Vital Signs Assessment: post-procedure vital signs reviewed and stable Respiratory status: spontaneous breathing, nonlabored ventilation and respiratory function stable Cardiovascular status: blood pressure returned to baseline and stable Postop Assessment: no apparent nausea or vomiting and able to ambulate Anesthetic complications: no   No complications documented.  Last Vitals:  Vitals:   08/11/20 1200 08/11/20 1229  BP: (!) 185/76 (!) 157/79  Pulse: 80 71  Resp: (!) 23 (!) 24  Temp:  36.7 C  SpO2: 100% 100%    Last Pain:  Vitals:   08/11/20 1229  TempSrc: Oral  PainSc:                  Valjean Ruppel,E. Rainier Feuerborn

## 2020-08-11 NOTE — Op Note (Addendum)
[facility] Patient Name: Morgan [name] Procedure Date: 08/11/2020 MRN: [id number] Attending MD: Estill Cotta. Loletha Carrow , MD Date of Birth: 1951/05/27 CSN: [id number] Age: 69 Admit Type: Inpatient Procedure:                Upper GI endoscopy Indications:              Generalized abdominal pain, For therapy of pyloric                            stenosis, Follow-up of chronic gastric ulcer,                            Nausea with vomiting Providers:                Mallie Mussel L. Loletha Carrow, MD, Josie Dixon, RN, Burtis Junes,                            RN, Tyna Jaksch Technician Referring MD:             [facility] Medicines:                General Anesthesia Complications:            No immediate complications. Estimated Blood Loss:     Estimated blood loss was minimal. Procedure:                Pre-Anesthesia Assessment:                           - Prior to the procedure, a History and Physical                            was performed, and patient medications and                            allergies were reviewed. The patient's tolerance of                            previous anesthesia was also reviewed. The risks                            and benefits of the procedure and the sedation                            options and risks were discussed with the patient.                            All questions were answered, and informed consent                            was obtained. Prior Anticoagulants: The patient has                            taken no previous anticoagulant or antiplatelet  agents. ASA Grade Assessment: III - A patient with                            severe systemic disease. After reviewing the risks                            and benefits, the patient was deemed in                            satisfactory condition to undergo the procedure.                           After obtaining informed consent, the endoscope was                             passed under direct vision. Throughout the                            procedure, the patient's blood pressure, pulse, and                            oxygen saturations were monitored continuously. The                            GIF-H190 ([id number]) Olympus gastroscope was                            introduced through the mouth, and advanced to the                            third part of duodenum. The upper GI endoscopy was                            accomplished without difficulty. The patient                            tolerated the procedure well. Scope In: Scope Out: Findings:      The esophagus was normal.      A benign-appearing, intrinsic moderate stenosis was found at the       pylorus. This was traversed. A TTS dilator was passed through the scope.       Dilation with a 12-13.5-15 mm pyloric balloon dilator was performed to       15 mm. The dilation site was examined and showed moderate mucosal       disruption and moderate improvement in luminal narrowing.      The exam of the stomach was otherwise normal.      The examined duodenum was normal. Impression:               - Normal esophagus.                           - Gastric stenosis was found at the pylorus.  Dilated.                           - Normal examined duodenum.                           - No specimens collected.                           It is not clear to what extent this stricture is                            contributing to chronic symptoms. No prior or                            current evidence of GOO. Much of current acute                            symptoms appear to be from acute gastroenteritis. Moderate Sedation:      GETA Recommendation:           - Return patient to hospital ward for ongoing care.                           - Full liquid diet.                           - Continue present medications. Symptomatic therapy                            for symptoms. Slowly  advance diet as tolerated. Procedure Code(s):        --- Professional ---                           [phone], Esophagogastroduodenoscopy, flexible,                            transoral; with dilation of gastric/duodenal                            stricture(s) (eg, balloon, bougie) Diagnosis Code(s):        --- Professional ---                           K31.1, Adult hypertrophic pyloric stenosis                           R10.84, Generalized abdominal pain                           K25.7, Chronic gastric ulcer without hemorrhage or                            perforation                           R11.2, Nausea with vomiting, unspecified CPT copyright 2019 American Medical Association.  All rights reserved. The codes documented in this report are preliminary and upon coder review may  be revised to meet current compliance requirements. Shavonna Corella L. Loletha Carrow, MD 08/11/2020 11:40:00 AM This report has been signed electronically. Number of Addenda: 0

## 2020-08-11 NOTE — Interval H&P Note (Signed)
History and Physical Interval Note:  08/11/2020 10:49 AM  Morgan [name]  has presented today for surgery, with the diagnosis of nausea, vomiting.  The various methods of treatment have been discussed with the patient and family. After consideration of risks, benefits and other options for treatment, the patient has consented to  Procedure(s): ESOPHAGOGASTRODUODENOSCOPY (EGD) WITH PROPOFOL (N/A) as a surgical intervention.  The patient's history has been reviewed, patient examined, no change in status, stable for surgery.  I have reviewed the patient's chart and labs.  Questions were answered to the patient's satisfaction.     [name]

## 2020-08-11 NOTE — Transfer of Care (Signed)
Immediate Anesthesia Transfer of Care Note  Patient: Morgan [name]  Procedure(s) Performed: ESOPHAGOGASTRODUODENOSCOPY (EGD) WITH PROPOFOL (N/A ) PYLORIC DILATION  Patient Location: PACU and Endoscopy Unit  Anesthesia Type:General  Level of Consciousness: awake and alert   Airway & Oxygen Therapy: Patient Spontanous Breathing and Patient connected to face mask oxygen  Post-op Assessment: Report given to RN and Post -op Vital signs reviewed and stable  Post vital signs: Reviewed and stable  Last Vitals:  Vitals Value Taken Time  BP    Temp    Pulse    Resp    SpO2      Last Pain:  Vitals:   08/11/20 1011  TempSrc: Oral  PainSc: 6       Patients Stated Pain Goal: 3 (97/94/80 1655)  Complications: No complications documented.

## 2020-08-11 NOTE — Progress Notes (Signed)
PT Cancellation Note  Patient Details Name: Morgan [name] MRN: [id number] DOB: Oct 07, 1951   Cancelled Treatment:    Reason Eval/Treat Not Completed: Patient at procedure or test/unavailable. Pt off floor for a procedure, will follow.   Blondell Reveal Kistler PT 08/11/2020  Acute Rehabilitation Services Pager [phone] Office [phone]

## 2020-08-11 NOTE — Anesthesia Preprocedure Evaluation (Addendum)
Anesthesia Evaluation  Patient identified by MRN, date of birth, ID band Patient awake    Reviewed: Allergy & Precautions, NPO status , Patient's Chart, lab work & pertinent test results, reviewed documented beta blocker date and time   History of Anesthesia Complications Negative for: history of anesthetic complications  Airway Mallampati: II  TM Distance: >3 FB Neck ROM: Full    Dental  (+) Lower Dentures, Upper Dentures, Dental Advisory Given   Pulmonary COPD (O2 at night),  COPD inhaler and oxygen dependent, former smoker,  08/09/2020 SARS coronavirus NEG   breath sounds clear to auscultation       Cardiovascular hypertension, Pt. on medications and Pt. on home beta blockers (-) angina Rhythm:Regular Rate:Normal  '19 ECHO: Systolic function was normal. EF 55- 65%.  Mild MR   Neuro/Psych Anxiety Depression TIA   GI/Hepatic Neg liver ROS, GERD  Medicated and Poorly Controlled,Current nausea and vomiting, abdominal pain   Endo/Other  obese  Renal/GU negative Renal ROS     Musculoskeletal   Abdominal (+) + obese,   Peds  Hematology  (+) Blood dyscrasia (Hb 9.0), anemia ,   Anesthesia Other Findings   Reproductive/Obstetrics                            Anesthesia Physical Anesthesia Plan  ASA: III  Anesthesia Plan: General   Post-op Pain Management:    Induction: Intravenous, Rapid sequence and Cricoid pressure planned  PONV Risk Score and Plan: 3 and Treatment may vary due to age or medical condition, Ondansetron and Dexamethasone  Airway Management Planned: Oral ETT  Additional Equipment: None  Intra-op Plan:   Post-operative Plan: Extubation in OR  Informed Consent: I have reviewed the patients History and Physical, chart, labs and discussed the procedure including the risks, benefits and alternatives for the proposed anesthesia with the patient or authorized representative  who has indicated his/her understanding and acceptance.       Plan Discussed with: CRNA and Surgeon  Anesthesia Plan Comments:        Anesthesia Quick Evaluation

## 2020-08-11 NOTE — Anesthesia Procedure Notes (Signed)
Date/Time: 08/11/2020 11:39 AM Performed by: Cynda Familia, CRNA Oxygen Delivery Method: Simple face mask Placement Confirmation: positive ETCO2 and breath sounds checked- equal and bilateral Dental Injury: Teeth and Oropharynx as per pre-operative assessment

## 2020-08-11 NOTE — Progress Notes (Addendum)
[facility]                                                                              Patient Demographics  Morgan [name], is a 69 y.o. female, DOB - 01/19/1951, ZSW:[id number]  Admit date - 08/09/2020   Admitting Physician Toy Baker, MD  Outpatient Primary MD for the patient is [name], MD  Outpatient specialists:   LOS - 2  days   Medical records reviewed and are as summarized below:    Chief Complaint  Patient presents with   Abdominal Pain       Brief summary   Morgan [name] is a 69 y.o. female with medical history significant of COPD on 2L , CHF, CKD, chronic iron deficiency, PUD/partial pyloric stenosis presented with significant nausea, hard time drinking.  Patient also reported acute worsening of her chronic abdominal pain, decreased urine output.   Was hospitalized in June, 2021 for chronic cholecystitis, underwent cholecystectomy.  She was evaluated by GI outpatient, was scheduled for colonoscopy and EGD, findings include gastric stenosis of the pylorus, nonbleeding gastric ulcer, gastritis with hemorrhage, several colon polyps were removed.  She underwent dilation of the pyloric stenosis.   Patient reported nausea, vomiting, abdominal pain, frequent nausea without vomiting since the endoscopy.  Also had diarrhea couple days ago and a day prior to admission. CT abdomen pelvis showed few mildly dilated loops of small bowel in the midabdomen with air-fluid levels, no gastric distention.   UA positive for UTI.  Assessment & Plan    Principal problem  Acute nausea vomiting abdominal pain, diarrhea, partial SBO on CT -Currently nauseous but no vomiting.  GI and general surgery were consulted, hold off on NG decompression -Patient was placed on IV fluid hydration, n.p.o. for endoscopy -Underwent EGD today, benign-appearing intrinsic moderate stenosis found at the pylorus, dilated to 15 mm, -Start full liquid diet, will evaluate if able to  tolerate.  Continue IV fluids for now until tolerating diet   Active problems  History of PUD, pyloric stenosis status post dilation in 06/2020, GERD -Continue PPI, gastric pyloric stenosis dilated today on EGD  Dehydration -Current IV fluid hydration - UA + ketones    Acute lower UTI -Urine culture showed more than 1000 colonies of E. 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 pylori, cecal polyp with tubular adenoma without high-grade dysplasia or malignancy GI following  Obesity Estimated body mass index is 32.06 kg/m as calculated from the following:   Height as of this encounter: 6\' 1"  (1.854 m).   Weight as of this encounter: 110.2 kg.  Code Status: Full code DVT Prophylaxis: SCDs Family Communication: Discussed all imaging results, lab results, explained to the patient   Disposition Plan:     Status is: Inpatient  Remains inpatient appropriate because:IV treatments appropriate due to intensity of illness or inability to take PO   Dispo: The patient is from: Home              Anticipated d/c is to: Home              Anticipated d/c date is: 2 days              Patient currently is not  medically stable to d/c.  Status post EGD, gastric pyloric stenosis dilated, starting on diet today, if able to tolerate, hopefully can DC in a.m.     Time Spent in minutes 35 minutes  Procedures:  EGD  Consultants:   GI General surgery  Antimicrobials:   Anti-infectives (From admission, onward)   Start     Dose/Rate Route Frequency Ordered Stop   08/10/20 1000  cefTRIAXone (ROCEPHIN) 2 g in sodium chloride 0.9 % 100 mL IVPB        2 g 200 mL/hr over 30 Minutes Intravenous Every 24 hours 08/09/20 2226     08/09/20 2030  cefTRIAXone (ROCEPHIN) 1 g in sodium chloride 0.9 % 100 mL IVPB        1 g 200 mL/hr over 30 Minutes Intravenous  Once 08/09/20 2016 08/09/20 2329         Medications  Scheduled Meds:  atorvastatin  40 mg Oral Daily   pantoprazole (PROTONIX) IV  40 mg Intravenous QHS   sodium chloride flush  3 mL Intravenous Q12H   Continuous Infusions:  cefTRIAXone (ROCEPHIN)  IV 2 g (08/11/20 1248)   dextrose 5 % and 0.45 % NaCl with KCl 20 mEq/L 100 mL/hr at 08/11/20 1000   PRN Meds:.acetaminophen, albuterol, morphine injection, morphine injection, ondansetron **OR** ondansetron (ZOFRAN) IV      Subjective:   Morgan [name] was seen and examined today.  Seen in a.m., persistent nausea, waiting for EGD.  No active vomiting, no fever chills. + upper abdominal pain. No chest pain, shortness of breath or dizziness. No acute events overnight  Objective:   Vitals:   08/11/20 1147 08/11/20 1156 08/11/20 1200 08/11/20 1229  BP: (!) 169/84 (!) 182/88 (!) 185/76 (!) 157/79  Pulse:  87 80 71  Resp: (!) 21 20 (!) 23 (!) 24  Temp:    98 F (36.7 C)  TempSrc:    Oral  SpO2: 100% 100% 100% 100%  Weight:      Height:        Intake/Output Summary (Last 24 hours) at 08/11/2020 1334 Last data filed at 08/11/2020 1318 Gross per 24 hour  Intake 2877.04 ml  Output 100 ml  Net 2777.04 ml     Wt Readings from Last 3 Encounters:  08/11/20 110.2 kg  07/04/20  110.2 kg  06/01/20 111.6 kg   Physical Exam  General: Alert and oriented x 3, NAD  Cardiovascular: S1 S2 clear, RRR. No pedal edema b/l  Respiratory: CTAB, no wheezing, rales or rhonchi  Gastrointestinal: Soft, mild diffuse TTP, nondistended, NBS  Ext: no pedal edema bilaterally  Neuro: no new deficits  Musculoskeletal: No cyanosis,  clubbing  Skin: No rashes  Psych: Normal affect and demeanor, alert and oriented x3    Data Reviewed:  I have personally reviewed following labs and imaging studies  Micro Results Recent Results (from the past 240 hour(s))  SARS Coronavirus 2 by RT PCR (hospital order, performed in [facility] hospital lab) Nasopharyngeal Nasopharyngeal Swab     Status: None   Collection Time: 08/09/20  8:59 PM   Specimen: Nasopharyngeal Swab  Result Value Ref Range Status   SARS Coronavirus 2 NEGATIVE NEGATIVE Final    Comment: (NOTE) SARS-CoV-2 target nucleic acids are NOT DETECTED.  The SARS-CoV-2 RNA is generally detectable in upper and lower respiratory specimens during the acute phase of infection. The lowest concentration of SARS-CoV-2 viral copies this assay can detect is 250 copies / mL. A negative result does not preclude SARS-CoV-2 infection and should not be used as the sole basis for treatment or other patient management decisions.  A negative result may occur with improper specimen collection / handling, submission of specimen other than nasopharyngeal swab, presence of viral mutation(s) within the areas targeted by this assay, and inadequate number of viral copies (<250 copies / mL). A negative result must be combined with clinical observations, patient history, and epidemiological information.  Fact Sheet for Patients:   StrictlyIdeas.no  Fact Sheet for Healthcare Providers: BankingDealers.co.za  This test is not yet approved or  cleared by the [place] FDA and has been authorized for  detection and/or diagnosis of SARS-CoV-2 by FDA under an Emergency Use Authorization (EUA).  This EUA will remain in effect (meaning this test can be used) for the duration of the COVID-19 declaration under Section 564(b)(1) of the Act, 21 U.S.C. section 360bbb-3(b)(1), unless the authorization is terminated or revoked sooner.  Performed at [facility], [place] [address] [place], [place] [address]   Culture, Urine     Status: Abnormal (Preliminary result)   Collection Time: 08/09/20  9:12 PM   Specimen: Urine, Clean Catch  Result Value Ref Range Status   Specimen Description   Final    URINE, CLEAN CATCH Performed at [facility], [place] [address] [place], [place] [address]    Special Requests   Final    NONE Performed at [facility], [place] [address], [place], [place] [address]    Culture >=100,000 COLONIES/mL ESCHERICHIA COLI (A)  Final   Report Status PENDING  Incomplete    Radiology Reports DG Abd 1 View  Result Date: 08/10/2020 CLINICAL DATA:  Small-bowel obstruction, not feeling better EXAM: ABDOMEN - 1 VIEW COMPARISON:  Portable exam 0838 hours compared to 09/03/2017 FINDINGS: Excreted contrast material within renal collecting systems and bladder. Decreased small bowel distension since prior study. Increased colonic gas. No bowel wall thickening. Surgical clips RIGHT upper quadrant likely reflect cholecystectomy. Bones demineralized. IMPRESSION: Decreased small bowel distension since prior study Electronically Signed   By: Lavonia Dana M.D.   On: 08/10/2020 08:54   CT ABDOMEN PELVIS W CONTRAST  Result Date: 08/09/2020 CLINICAL DATA:  Abdominal distension EXAM: CT ABDOMEN AND PELVIS WITH CONTRAST TECHNIQUE: Multidetector CT imaging of the abdomen and pelvis was performed using the standard protocol following bolus administration of intravenous contrast. CONTRAST:  1106mL OMNIPAQUE IOHEXOL 300 MG/ML  SOLN COMPARISON:   05/17/2019 FINDINGS: Lower chest: No acute abnormality. Hepatobiliary: No focal liver abnormality is seen. Status post interval cholecystectomy. No biliary dilatation. Pancreas: Unremarkable. Spleen: Unremarkable. Adrenals/Urinary Tract: Adrenals are unremarkable. Kidneys and partially distended bladder  are unremarkable. Stomach/Bowel: Stomach is within normal limits. There are few mildly dilated loops of small bowel with air-fluid levels. Distal colonic diverticulosis. Vascular/Lymphatic: Aortic atherosclerosis. No enlarged lymph nodes identified. Reproductive: Status post hysterectomy. No adnexal masses. Other: Small volume free fluid in the pelvis presumably related to above. No acute abnormality of the abdominal wall. Musculoskeletal: No acute osseous abnormality. IMPRESSION: Few mildly dilated loops of small bowel in the mid abdomen with air-fluid levels suggesting low-grade obstruction. Interval cholecystectomy. Colonic diverticulosis. Electronically Signed   By: Macy Mis M.D.   On: 08/09/2020 19:47    Lab Data:  CBC: Recent Labs  Lab 08/09/20 1144 08/10/20 0549 08/11/20 0504  WBC 12.1* 10.3 7.9  NEUTROABS  --  7.6  --   HGB 12.3 10.3* 9.0*  HCT 44.0 34.3* 30.8*  MCV 88.7 82.5 83.9  PLT 215 299 881   Basic Metabolic Panel: Recent Labs  Lab 08/09/20 1144 08/10/20 0549 08/11/20 0504  NA 141 141 140  K 3.6 3.1* 3.2*  CL 103 105 105  CO2 22 27 27   GLUCOSE 107* 108* 102*  BUN 22 23 16   CREATININE 0.84 0.97 0.77  CALCIUM 8.8* 8.3* 7.9*  MG  --  2.3   2.3 2.2  PHOS  --  2.8  --    GFR: Estimated Creatinine Clearance: 96.2 mL/min (by C-G formula based on SCr of 0.77 mg/dL). Liver Function Tests: Recent Labs  Lab 08/09/20 1144 08/10/20 0549  AST 20 54*  ALT 9 22  ALKPHOS 119 116  BILITOT 0.4 0.6  PROT 6.6 6.1*  ALBUMIN 3.3* 3.2*   Recent Labs  Lab 08/09/20 1144  LIPASE 18   No results for input(s): AMMONIA in the last 168 hours. Coagulation Profile: No  results for input(s): INR, PROTIME in the last 168 hours. Cardiac Enzymes: No results for input(s): CKTOTAL, CKMB, CKMBINDEX, TROPONINI in the last 168 hours. BNP (last 3 results) No results for input(s): PROBNP in the last 8760 hours. HbA1C: No results for input(s): HGBA1C in the last 72 hours. CBG: No results for input(s): GLUCAP in the last 168 hours. Lipid Profile: No results for input(s): CHOL, HDL, LDLCALC, TRIG, CHOLHDL, LDLDIRECT in the last 72 hours. Thyroid Function Tests: Recent Labs    08/10/20 0549  TSH 4.591*   Anemia Panel: No results for input(s): VITAMINB12, FOLATE, FERRITIN, TIBC, IRON, RETICCTPCT in the last 72 hours. Urine analysis:    Component Value Date/Time   COLORURINE AMBER (A) 08/09/2020 1842   APPEARANCEUR CLOUDY (A) 08/09/2020 1842   APPEARANCEUR Cloudy 09/18/2012 2000   LABSPEC 1.028 08/09/2020 1842   LABSPEC 1.012 09/18/2012 2000   PHURINE 5.0 08/09/2020 1842   GLUCOSEU NEGATIVE 08/09/2020 1842   GLUCOSEU Negative 09/18/2012 2000   HGBUR MODERATE (A) 08/09/2020 1842   BILIRUBINUR NEGATIVE 08/09/2020 1842   BILIRUBINUR negative 04/08/2019 1103   BILIRUBINUR Negative 09/18/2012 2000   KETONESUR 5 (A) 08/09/2020 1842   PROTEINUR 30 (A) 08/09/2020 1842   UROBILINOGEN 0.2 04/08/2019 1103   UROBILINOGEN 0.2 05/25/2017 1743   NITRITE POSITIVE (A) 08/09/2020 1842   LEUKOCYTESUR LARGE (A) 08/09/2020 1842   LEUKOCYTESUR Trace 09/18/2012 2000     Juanjose Mojica M.D. [facility] 08/11/2020, 1:34 PM   Call night coverage person covering after 7pm

## 2020-08-11 NOTE — Interval H&P Note (Signed)
History and Physical Interval Note:  08/11/2020 10:50 AM  Morgan [name]  has presented today for surgery, with the diagnosis of nausea, vomiting.  The various methods of treatment have been discussed with the patient and family. After consideration of risks, benefits and other options for treatment, the patient has consented to  Procedure(s): ESOPHAGOGASTRODUODENOSCOPY (EGD) WITH PROPOFOL (N/A) as a surgical intervention.  The patient's history has been reviewed, patient examined, no change in status, stable for surgery.  I have reviewed the patient's chart and labs.  Questions were answered to the patient's satisfaction.    Again discussed with patient the plan for pylorus dilation, and she was agreeable.  [name]

## 2020-08-12 LAB — BASIC METABOLIC PANEL
Anion gap: 9 (ref 5–15)
BUN: 8 mg/dL (ref 8–23)
CO2: 24 mmol/L (ref 22–32)
Calcium: 8 mg/dL — ABNORMAL LOW (ref 8.9–10.3)
Chloride: 108 mmol/L (ref 98–111)
Creatinine, Ser: 0.78 mg/dL (ref 0.44–1.00)
GFR calc Af Amer: >60 mL/min (ref >60)
GFR calc non Af Amer: >60 mL/min (ref >60)
Glucose, Bld: 116 mg/dL — ABNORMAL HIGH (ref 70–99)
Potassium: 4.1 mmol/L (ref 3.5–5.1)
Sodium: 141 mmol/L (ref 135–145)

## 2020-08-12 LAB — CBC
HCT: 29.8 % — ABNORMAL LOW (ref 36.0–46.0)
Hemoglobin: 9.1 g/dL — ABNORMAL LOW (ref 12.0–15.0)
MCH: 24.9 pg — ABNORMAL LOW (ref 26.0–34.0)
MCHC: 30.5 g/dL (ref 30.0–36.0)
MCV: 81.6 fL (ref 80.0–100.0)
Platelets: 171 10*3/uL (ref 150–400)
RBC: 3.65 MIL/uL — ABNORMAL LOW (ref 3.87–5.11)
RDW: 15.8 % — ABNORMAL HIGH (ref 11.5–15.5)
WBC: 6.3 10*3/uL (ref 4.0–10.5)
nRBC: 0 % (ref 0.0–0.2)

## 2020-08-12 LAB — C DIFFICILE (CDIFF) QUICK SCRN (NO PCR REFLEX)
C Diff antigen: NEGATIVE
C Diff interpretation: NOT DETECTED
C Diff toxin: NEGATIVE

## 2020-08-12 NOTE — Evaluation (Signed)
Physical Therapy Evaluation Patient Details Name: Morgan [name] MRN: [id number] DOB: 1951/11/02 Today's Date: 08/12/2020   History of Present Illness  Morgan [name] is a 69 y.o. female with medical history significant of COPD on 2L , CHF, CKD, chronic iron deficiency, PUD/partial pyloric stenosis presented with significant nausea, hard time drinking.  Patient also reported acute worsening of her chronic abdominal pain, decreased urine output.   Was hospitalized in June, 2021 for chronic cholecystitis, underwent cholecystectomy.  She was evaluated by GI outpatient, was scheduled for colonoscopy and EGD, findings include gastric stenosis of the pylorus, nonbleeding gastric ulcer, gastritis with hemorrhage, several colon polyps were removed.  She underwent dilation of the pyloric stenosis.  Clinical Impression  Pt presents with decreased mobility secondary to the above diagnosis. Pt c/o nausea, but noted she has increased dizziness with position changes. Pt tested positive for bilateral nystagmus left beat. Pt had increased dizziness with head turns. Educated pt on vertigo and habituation training exercises. Pt is independent with transfers, but gait not fully assessed due to nausea limiting ability to participate. Pt may benefit from outpatient pt to fully assess BPPV. Pt will benefit from continued acute skilled PT to maximize mobility and Independence.    Follow Up Recommendations No PT follow up    Equipment Recommendations  None recommended by PT    Recommendations for Other Services  (vestibular rehab)     Precautions / Restrictions Precautions Precautions: Other (comment) Precaution Comments: dizzy Restrictions Weight Bearing Restrictions: No      Mobility  Bed Mobility Overal bed mobility: Independent                Transfers Overall transfer level: Modified independent Equipment used: None Transfers: Sit to/from Stand Sit to Stand: Modified independent (Device/Increase  time)            Ambulation/Gait Ambulation/Gait assistance: Supervision Gait Distance (Feet): 6 Feet Assistive device: None Gait Pattern/deviations: Decreased stride length;Step-through pattern Gait velocity: decreased   General Gait Details: gait distance limited by nausea and dizziness. Pt verbalized increased dizziness with turns.  Stairs            Wheelchair Mobility    Modified Rankin (Stroke Patients Only)       Balance Overall balance assessment: No apparent balance deficits (not formally assessed)                                           Pertinent Vitals/Pain Pain Score: 4  Pain Location: abdomen Pain Descriptors / Indicators: Discomfort Pain Intervention(s): Limited activity within patient's tolerance;Monitored during session    Home Living Family/patient expects to be discharged to:: Private residence Living Arrangements: Non-relatives/Friends;Other relatives Available Help at Discharge: Available 24 hours/day;Family Type of Home: House       Home Layout: One level Home Equipment: Walker - 2 wheels;Grab bars - tub/shower;Hand held shower head;Shower seat;Bedside commode      Prior Function Level of Independence: Independent               Hand Dominance        Extremity/Trunk Assessment   Upper Extremity Assessment Upper Extremity Assessment: Defer to OT evaluation    Lower Extremity Assessment Lower Extremity Assessment: Overall WFL for tasks assessed    Cervical / Trunk Assessment Cervical / Trunk Assessment: Normal  Communication   Communication: No difficulties  Cognition Arousal/Alertness: Awake/alert Behavior  During Therapy: WFL for tasks assessed/performed Overall Cognitive Status: Within Functional Limits for tasks assessed                                        General Comments General comments (skin integrity, edema, etc.): Pt had + nystagmus in both eyes beating to the left  with eyes focused following my finger. Increased dizziness with fast head turns.    Exercises     Assessment/Plan    PT Assessment Patient needs continued PT services  PT Problem List Decreased mobility;Decreased activity tolerance;Decreased balance;Pain       PT Treatment Interventions Functional mobility training;Balance training;Patient/family education;Gait training;Therapeutic activities;Therapeutic exercise;Other (comment) (vestibular training)    PT Goals (Current goals can be found in the Care Plan section)  Acute Rehab PT Goals Patient Stated Goal: To feel better PT Goal Formulation: With patient Time For Goal Achievement: 08/26/20 Potential to Achieve Goals: Good    Frequency Min 3X/week   Barriers to discharge        Co-evaluation               AM-PAC PT "6 Clicks" Mobility  Outcome Measure Help needed turning from your back to your side while in a flat bed without using bedrails?: None Help needed moving from lying on your back to sitting on the side of a flat bed without using bedrails?: None Help needed moving to and from a bed to a chair (including a wheelchair)?: None Help needed standing up from a chair using your arms (e.g., wheelchair or bedside chair)?: None Help needed to walk in hospital room?: A Little Help needed climbing 3-5 steps with a railing? : A Little 6 Click Score: 22    End of Session   Activity Tolerance: Treatment limited secondary to medical complications (Comment) Patient left: in bed;with call bell/phone within reach Nurse Communication: Mobility status PT Visit Diagnosis: BPPV;Dizziness and giddiness (R42);Unsteadiness on feet (R26.81)    Time: 4920-1007 PT Time Calculation (min) (ACUTE ONLY): 40 min   Charges:   PT Evaluation $PT Eval Moderate Complexity: 1 Mod PT Treatments $Gait Training: 8-22 mins $Therapeutic Activity: 8-22 mins        Lelon Mast 08/12/2020, 12:43 PM

## 2020-08-12 NOTE — Progress Notes (Signed)
[facility]                                                                              Patient Demographics  Morgan [name], is a 69 y.o. female, DOB - September 13, 1951, AST:[id number]  Admit date - 08/09/2020   Admitting Physician Morgan Baker, MD  Outpatient Primary MD for the patient is Morgan Noe, MD  Outpatient specialists:   LOS - 3  days   Medical records reviewed and are as summarized below:    Chief Complaint  Patient presents with  . Abdominal Pain       Brief summary   Morgan [name] is a 69 y.o. female with medical history significant of COPD on 2L , CHF, CKD, chronic iron deficiency, PUD/partial pyloric stenosis presented with significant nausea, hard time drinking.  Patient also reported acute worsening of her chronic abdominal pain, decreased urine output.   Was hospitalized in June, 2021 for chronic cholecystitis, underwent cholecystectomy.  She was evaluated by GI outpatient, was scheduled for colonoscopy and EGD, findings include gastric stenosis of the pylorus, nonbleeding gastric ulcer, gastritis with hemorrhage, several colon polyps were removed.  She underwent dilation of the pyloric stenosis.   Patient reported nausea, vomiting, abdominal pain, frequent nausea without vomiting since the endoscopy.  Also had diarrhea couple days ago and a day prior to admission. CT abdomen pelvis showed few mildly dilated loops of small bowel in the midabdomen with air-fluid levels, no gastric distention.   UA positive for UTI.  Assessment & Plan    Principal problem  Acute nausea vomiting abdominal pain, diarrhea, partial SBO on CT -Currently nauseous but no vomiting.  GI and general surgery were consulted, hold off on NG decompression -Patient was placed on IV fluid hydration, n.p.o. for endoscopy -EGD 8/26 benign-appearing intrinsic moderate stenosis found at the pylorus, dilated to 15 mm, -On full liquid diet, will advance to soft solids   Active  problems  History of PUD, pyloric stenosis status post dilation in 06/2020, GERD -Gastric pyloric stenosis dilated on 8/26, continue PPI  Dehydration -KVO IV fluids  E. 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 pylori, cecal polyp with tubular adenoma without high-grade dysplasia or malignancy GI following   Obesity Estimated body mass index is 32.06 kg/m as calculated from the following:   Height as of this encounter: 6\' 1"  (1.854 m).   Weight as  of this encounter: 110.2 kg.  Code Status: Full code DVT Prophylaxis: SCDs Family Communication: Discussed all imaging results, lab results, explained to the patient   Disposition Plan:     Status is: Inpatient  Remains inpatient appropriate because:IV treatments appropriate due to intensity of illness or inability to take PO   Dispo: The patient is from: Home              Anticipated d/c is to: Home              Anticipated d/c date is: 2 days              Patient currently is not medically stable to d/c.  Advancing to solid diet today, if able to tolerate, will DC home in a.m.    Time Spent in minutes 25 minutes  Procedures:  EGD  Consultants:    GI General surgery  Antimicrobials:   Anti-infectives (From admission, onward)   Start     Dose/Rate Route Frequency Ordered Stop   08/10/20 1000  cefTRIAXone (ROCEPHIN) 2 g in sodium chloride 0.9 % 100 mL IVPB        2 g 200 mL/hr over 30 Minutes Intravenous Every 24 hours 08/09/20 2226     08/09/20 2030  cefTRIAXone (ROCEPHIN) 1 g in sodium chloride 0.9 % 100 mL IVPB        1 g 200 mL/hr over 30 Minutes Intravenous  Once 08/09/20 2016 08/09/20 2329         Medications  Scheduled Meds: . ARIPiprazole  2 mg Oral Daily  . atorvastatin  40 mg Oral Daily  . carvedilol  3.125 mg Oral BID WC  . FLUoxetine  40 mg Oral Daily  . gabapentin  400 mg Oral TID  . mirtazapine  45 mg Oral QHS  . pantoprazole (PROTONIX) IV  40 mg Intravenous QHS  . sodium chloride flush  3 mL Intravenous Q12H  . sucralfate  1 g Oral TID WC & HS   Continuous Infusions: . cefTRIAXone (ROCEPHIN)  IV 2 g (08/12/20 0942)  . dextrose 5 % and 0.45 % NaCl with KCl 20 mEq/L 50 mL/hr at 08/12/20 1036   PRN Meds:.acetaminophen, albuterol, hydrALAZINE, morphine injection, morphine injection, ondansetron **OR** ondansetron (ZOFRAN) IV      Subjective:   Morgan [name] was seen and examined today.  Complaining of sore throat, mild upper abdominal pain, not feeling too good with the diet.  Nausea is improving.   No chest pain, shortness of breath or dizziness. No acute events overnight  Objective:   Vitals:   08/11/20 2219 08/12/20 0530 08/12/20 1225 08/12/20 1416  BP: 132/65 134/68  130/64  Pulse: 65 (!) 55  (!) 58  Resp: 18 18  18   Temp: 98.2 F (36.8 C) 97.9 F (36.6 C)  98.2 F (36.8 C)  TempSrc: Oral Oral  Oral  SpO2: 99% 100% 98% 100%  Weight:      Height:        Intake/Output Summary (Last 24 hours) at 08/12/2020 1417 Last data filed at 08/12/2020 1000 Gross per 24 hour  Intake 420 ml  Output 1450 ml  Net -1030 ml     Wt Readings from Last 3 Encounters:  08/11/20 110.2 kg  07/04/20  110.2 kg  06/01/20 111.6 kg   Physical Exam  General: Alert and oriented x 3, NAD  Cardiovascular: S1 S2 clear, RRR. No pedal edema b/l  Respiratory: CTAB, no wheezing, rales or rhonchi  Gastrointestinal:  Soft, nontender, nondistended, NBS  Ext: no pedal edema bilaterally  Neuro: no new deficits  Musculoskeletal: No cyanosis, clubbing  Skin: No rashes  Psych: Normal affect and demeanor, alert and oriented x3   Data Reviewed:  I have personally reviewed following labs and imaging studies  Micro Results Recent Results (from the past 240 hour(s))  SARS Coronavirus 2 by RT PCR (hospital order, performed in [facility] hospital lab) Nasopharyngeal Nasopharyngeal Swab     Status: None   Collection Time: 08/09/20  8:59 PM   Specimen: Nasopharyngeal Swab  Result Value Ref Range Status   SARS Coronavirus 2 NEGATIVE NEGATIVE Final    Comment: (NOTE) SARS-CoV-2 target nucleic acids are NOT DETECTED.  The SARS-CoV-2 RNA is generally detectable in upper and lower respiratory specimens during the acute phase of infection. The lowest concentration of SARS-CoV-2 viral copies this assay can detect is 250 copies / mL. A negative result does not preclude SARS-CoV-2 infection and should not be used as the sole basis for treatment or other patient management decisions.  A negative result may occur with improper specimen collection / handling, submission of specimen other than nasopharyngeal swab, presence of viral mutation(s) within the areas targeted by this assay, and inadequate number of viral copies (<250 copies / mL). A negative result must be combined with clinical observations, patient history, and epidemiological information.  Fact Sheet for Patients:   StrictlyIdeas.no  Fact Sheet for Healthcare Providers: BankingDealers.co.za  This test is not yet approved or  cleared by the [place] FDA and has been authorized for  detection and/or diagnosis of SARS-CoV-2 by FDA under an Emergency Use Authorization (EUA).  This EUA will remain in effect (meaning this test can be used) for the duration of the COVID-19 declaration under Section 564(b)(1) of the Act, 21 U.S.C. section 360bbb-3(b)(1), unless the authorization is terminated or revoked sooner.  Performed at [facility], [place] [address], [place], [place] [address]   Culture, Urine     Status: Abnormal (Preliminary result)   Collection Time: 08/09/20  9:12 PM   Specimen: Urine, Clean Catch  Result Value Ref Range Status   Specimen Description   Final    URINE, CLEAN CATCH Performed at [facility], [place] [address] [place], [place] [address]    Special Requests   Final    NONE Performed at [facility], [place] [address], [place], [place] [address]    Culture (A)  Final    >=100,000 COLONIES/mL ESCHERICHIA COLI SUSCEPTIBILITIES TO FOLLOW Performed at [facility], [place] [address], [place], [place] [address]    Report Status PENDING  Incomplete    Radiology Reports DG Abd 1 View  Result Date: 08/10/2020 CLINICAL DATA:  Small-bowel obstruction, not feeling better EXAM: ABDOMEN - 1 VIEW COMPARISON:  Portable exam 0838 hours compared to 09/03/2017 FINDINGS: Excreted contrast material within renal collecting systems and bladder. Decreased small bowel distension since prior study. Increased colonic gas. No bowel wall thickening. Surgical clips RIGHT upper quadrant likely reflect cholecystectomy. Bones demineralized. IMPRESSION: Decreased small bowel distension since prior study Electronically Signed   By: Lavonia Dana M.D.   On: 08/10/2020 08:54   CT ABDOMEN PELVIS W CONTRAST  Result Date: 08/09/2020 CLINICAL DATA:  Abdominal distension EXAM: CT ABDOMEN AND PELVIS WITH CONTRAST TECHNIQUE: Multidetector CT imaging of the abdomen and pelvis was performed using the standard protocol following bolus  administration of intravenous contrast. CONTRAST:  180mL OMNIPAQUE IOHEXOL 300 MG/ML  SOLN COMPARISON:  05/17/2019 FINDINGS: Lower chest: No acute abnormality. Hepatobiliary: No focal liver abnormality is seen. Status post interval cholecystectomy. No biliary dilatation. Pancreas: Unremarkable. Spleen: Unremarkable. Adrenals/Urinary Tract: Adrenals are unremarkable. Kidneys and partially distended bladder are unremarkable. Stomach/Bowel: Stomach is within normal limits. There are few mildly dilated loops of small bowel with air-fluid levels. Distal colonic diverticulosis. Vascular/Lymphatic: Aortic atherosclerosis. No enlarged lymph nodes identified. Reproductive: Status post hysterectomy. No adnexal masses. Other: Small volume free fluid in the pelvis presumably related to above. No acute abnormality of the abdominal wall. Musculoskeletal: No acute osseous abnormality. IMPRESSION: Few mildly dilated loops of small bowel in the mid abdomen with air-fluid levels suggesting low-grade obstruction. Interval cholecystectomy. Colonic diverticulosis. Electronically Signed   By: Macy Mis M.D.   On: 08/09/2020 19:47    Lab Data:  CBC: Recent Labs  Lab 08/09/20 1144 08/10/20 0549 08/11/20 0504 08/12/20 0519  WBC 12.1* 10.3 7.9 6.3  NEUTROABS  --  7.6  --   --   HGB 12.3 10.3* 9.0* 9.1*  HCT 44.0 34.3* 30.8* 29.8*  MCV 88.7 82.5 83.9 81.6  PLT 215 299 275 427   Basic Metabolic Panel: Recent Labs  Lab 08/09/20 1144 08/10/20 0549 08/11/20 0504 08/12/20 0519  NA 141 141 140 141  K 3.6 3.1* 3.2* 4.1  CL 103 105 105 108  CO2 22 27 27 24   GLUCOSE 107* 108* 102* 116*  BUN 22 23 16 8   CREATININE 0.84 0.97 0.77 0.78  CALCIUM 8.8* 8.3* 7.9* 8.0*  MG  --  2.3  2.3 2.2  --   PHOS  --  2.8  --   --    GFR: Estimated Creatinine Clearance: 96.2 mL/min (by C-G formula based on SCr of 0.78 mg/dL). Liver Function Tests: Recent Labs  Lab 08/09/20 1144 08/10/20 0549  AST 20 54*  ALT 9 22    ALKPHOS 119 116  BILITOT 0.4 0.6  PROT 6.6 6.1*  ALBUMIN 3.3* 3.2*   Recent Labs  Lab 08/09/20 1144  LIPASE 18   No results for input(s): AMMONIA in the last 168 hours. Coagulation Profile: No results for input(s): INR, PROTIME in the last 168 hours. Cardiac Enzymes: No results for input(s): CKTOTAL, CKMB, CKMBINDEX, TROPONINI in the last 168 hours. BNP (last 3 results) No results for input(s): PROBNP in the last 8760 hours. HbA1C: No results for input(s): HGBA1C in the last 72 hours. CBG: No results for input(s): GLUCAP in the last 168 hours. Lipid Profile: No results for input(s): CHOL, HDL, LDLCALC, TRIG, CHOLHDL, LDLDIRECT in the last 72 hours. Thyroid Function Tests: Recent Labs    08/10/20 0549  TSH 4.591*   Anemia Panel: No results for input(s): VITAMINB12, FOLATE, FERRITIN, TIBC, IRON, RETICCTPCT in the last 72 hours. Urine analysis:    Component Value Date/Time   COLORURINE AMBER (A) 08/09/2020 1842   APPEARANCEUR CLOUDY (A) 08/09/2020 1842   APPEARANCEUR Cloudy 09/18/2012 2000   LABSPEC 1.028 08/09/2020 1842   LABSPEC 1.012 09/18/2012 2000   PHURINE 5.0 08/09/2020 1842   GLUCOSEU NEGATIVE 08/09/2020 1842   GLUCOSEU Negative 09/18/2012 2000   HGBUR MODERATE (A) 08/09/2020 1842   BILIRUBINUR NEGATIVE 08/09/2020 1842   BILIRUBINUR negative 04/08/2019 1103   BILIRUBINUR Negative 09/18/2012 2000   KETONESUR 5 (A) 08/09/2020 1842   PROTEINUR 30 (A) 08/09/2020 1842   UROBILINOGEN 0.2 04/08/2019 1103   UROBILINOGEN 0.2 05/25/2017 1743   NITRITE POSITIVE (A) 08/09/2020 1842   LEUKOCYTESUR LARGE (A) 08/09/2020 1842  LEUKOCYTESUR Trace 09/18/2012 2000     Estill Cotta M.D. [facility] 08/12/2020, 2:17 PM   Call night coverage person covering after 7pm

## 2020-08-12 NOTE — Progress Notes (Addendum)
[facility] Progress Note  Chief Complaint: Generalized abdominal pain, nausea and vomiting  History:  Morgan [name] feels about the same as yesterday, but her nausea has subsided and she would like to try some food. She still has generalized abdominal pain which has been present for months, worse in with some associated diarrhea in the last several days. She does not feel any different after the balloon dilation of the pyloric stricture yesterday. She was glad to hear that the gastric ulcer had resolved. She had chronically taken Excedrin Migraine containing aspirin as well as baby aspirin, and these were felt to been the cause of ulcer.  ROS: Cardiovascular: Denies chest pain Respiratory: Denies dyspnea Urinary: She has urinary frequency without dysuria as near as I can tell. 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  acetaminophen (TYLENOL) tablet 650 mg, 650 mg, Oral, Q4H PRN, [name], MD, 650 mg at 08/11/20 2137 .  albuterol (PROVENTIL) (2.5 MG/3ML) 0.083% nebulizer solution 2.5 mg, 2.5 mg, Nebulization, Q2H PRN, Danis, Estill Cotta III, MD .  ARIPiprazole (ABILIFY) tablet 2 mg, 2 mg, Oral, Daily, Rai, Ripudeep K, MD, 2 mg at 08/12/20 0943 .  atorvastatin (LIPITOR) tablet 40 mg, 40 mg, Oral, Daily, Danis, Estill Cotta III, MD, 40 mg at 08/12/20 0943 .  carvedilol (COREG) tablet 3.125 mg, 3.125 mg, Oral, BID WC, Rai, Ripudeep K, MD, 3.125 mg at 08/12/20 0943 .  cefTRIAXone (ROCEPHIN) 2 g in sodium chloride 0.9 % 100 mL IVPB, 2 g, Intravenous, Q24H, Danis, Jaydenn Boccio L III, MD, Last Rate: 200 mL/hr at 08/12/20 0942, 2 g at 08/12/20 0942 .  FLUoxetine (PROZAC) capsule 40 mg, 40 mg, Oral, Daily, Rai, Ripudeep K, MD, 40 mg at 08/12/20 0943 .  gabapentin (NEURONTIN) capsule 400 mg, 400 mg, Oral, TID, Rai, Ripudeep K, MD, 400 mg at 08/12/20 0943 .  hydrALAZINE (APRESOLINE) injection 10 mg, 10 mg, Intravenous, Q6H PRN,  Rai, Ripudeep K, MD .  mirtazapine (REMERON) tablet 45 mg, 45 mg, Oral, QHS, Rai, Ripudeep K, MD, 45 mg at 08/11/20 2136 .  morphine 2 MG/ML injection 2 mg, 2 mg, Intravenous, Q2H PRN, [name], MD, 2 mg at 08/12/20 1331 .  morphine 2 MG/ML injection 2-4 mg, 2-4 mg, Intravenous, Q4H PRN, Danis, Estill Cotta III, MD .  ondansetron (ZOFRAN) tablet 4 mg, 4 mg, Oral, Q6H PRN **OR** ondansetron (ZOFRAN) injection 4 mg, 4 mg, Intravenous, Q6H PRN, [name], MD, 4 mg at 08/12/20 1610 .  sodium chloride flush (NS) 0.9 % injection 3 mL, 3 mL, Intravenous, Q12H, Danis, Estill Cotta III, MD, 3 mL at 08/10/20 0906  . cefTRIAXone (ROCEPHIN)  IV 2 g (08/12/20 0942)     Vital signs in last 24 hrs: Vitals:   08/12/20 1225 08/12/20 1416  BP:  130/64  Pulse:  (!) 58  Resp:  18  Temp:  98.2 F (36.8 C)  SpO2: 98% 100%    Intake/Output Summary (Last 24 hours) at 08/12/2020 1455 Last data filed at 08/12/2020 1000 Gross per 24 hour  Intake 420 ml  Output 1450 ml  Net -1030 ml     Physical Exam   HEENT: sclera anicteric, oral mucosa without lesions  Neck: supple, no thyromegaly, JVD or lymphadenopathy  Cardiac: RRR without murmurs, S1S2 heard, no peripheral edema  Pulm: clear to auscultation bilaterally, normal RR and effort noted  Abdomen: soft, generalized tenderness  to light palpation of the abdominal wall, with active bowel sounds. No guarding or palpable hepatosplenomegaly  Skin; warm and dry, no jaundice  Recent Labs:  CBC Latest Ref Rng & Units 08/12/2020 08/11/2020 08/10/2020  WBC 4.0 - 10.5 K/uL 6.3 7.9 10.3  Hemoglobin 12.0 - 15.0 g/dL 9.1(L) 9.0(L) 10.3(L)  Hematocrit 36 - 46 % 29.8(L) 30.8(L) 34.3(L)  Platelets 150 - 400 K/uL 171 275 299    No results for input(s): INR in the last 168 hours. CMP Latest Ref Rng & Units 08/12/2020 08/11/2020 08/10/2020  Glucose 70 - 99 mg/dL 116(H) 102(H) 108(H)  BUN 8 - 23 mg/dL 8 16 23   Creatinine 3.61 - 1.00 mg/dL 0.78 0.77 0.97   Sodium 135 - 145 mmol/L 141 140 141  Potassium 3.5 - 5.1 mmol/L 4.1 3.2(L) 3.1(L)  Chloride 98 - 111 mmol/L 108 105 105  CO2 22 - 32 mmol/L 24 27 27   Calcium 8.9 - 10.3 mg/dL 8.0(L) 7.9(L) 8.3(L)  Total Protein 6.5 - 8.1 g/dL - - 6.1(L)  Total Bilirubin 0.3 - 1.2 mg/dL - - 0.6  Alkaline Phos 38 - 126 U/L - - 116  AST 15 - 41 U/L - - 54(H)  ALT 0 - 44 U/L - - 22   Assessment & Plan  Assessment: Chronic generalized abdominal pain chronic constipation, but acute diarrhea with some CT scan findings suggesting probable acute gastroenteritis. Chronic nausea and vomiting. Pyloric stricture of now doubtful relation to the patient's symptoms. She has not previously demonstrated gastric outlet obstruction, nor of her symptoms improved after balloon dilation of the stricture.  I suspect this patient has functional abdominal pain as part of a chronic pain syndrome, and I am not sure how much of this we can improve while she is hospitalized. I would avoid opioids. We are trying to settle down her nausea best we can with symptomatic treatment. She would like to advance diet slowly and I am agreeable.  Also not clear how or if this chronic UTI is related to her feeling generally unwell.  Protonix discontinued Carafate discontinued  C. difficile PCR, toxin and antigen ordered since she has had recurrent rounds of antibiotics for the UTI in the last few months. I do not think she picked up C. difficile during his hospitalization, as she had diarrhea prior to arrival.  When she is tolerating some solid food and liquids, she can be discharged home for outpatient follow-up with Dr. Silverio Decamp. I will see her over the weekend if called.   Total time 25 minutes [name] Office: [phone]

## 2020-08-13 DIAGNOSIS — R1010 Upper abdominal pain, unspecified: Secondary | ICD-10-CM

## 2020-08-13 LAB — CBC
HCT: 31 % — ABNORMAL LOW (ref 36.0–46.0)
Hemoglobin: 9.2 g/dL — ABNORMAL LOW (ref 12.0–15.0)
MCH: 24.7 pg — ABNORMAL LOW (ref 26.0–34.0)
MCHC: 29.7 g/dL — ABNORMAL LOW (ref 30.0–36.0)
MCV: 83.3 fL (ref 80.0–100.0)
Platelets: 268 10*3/uL (ref 150–400)
RBC: 3.72 MIL/uL — ABNORMAL LOW (ref 3.87–5.11)
RDW: 15.9 % — ABNORMAL HIGH (ref 11.5–15.5)
WBC: 7.1 10*3/uL (ref 4.0–10.5)
nRBC: 0 % (ref 0.0–0.2)

## 2020-08-13 LAB — BASIC METABOLIC PANEL
Anion gap: 8 (ref 5–15)
BUN: 6 mg/dL — ABNORMAL LOW (ref 8–23)
CO2: 29 mmol/L (ref 22–32)
Calcium: 8.2 mg/dL — ABNORMAL LOW (ref 8.9–10.3)
Chloride: 105 mmol/L (ref 98–111)
Creatinine, Ser: 0.81 mg/dL (ref 0.44–1.00)
GFR calc Af Amer: >60 mL/min (ref >60)
GFR calc non Af Amer: >60 mL/min (ref >60)
Glucose, Bld: 100 mg/dL — ABNORMAL HIGH (ref 70–99)
Potassium: 2.9 mmol/L — ABNORMAL LOW (ref 3.5–5.1)
Sodium: 142 mmol/L (ref 135–145)

## 2020-08-13 LAB — URINE CULTURE: Culture: >=100000 — AB

## 2020-08-13 MED ORDER — FUROSEMIDE 40 MG PO TABS
40.0000 mg | ORAL_TABLET | Freq: Every day | ORAL | Status: DC
  Administered 2020-08-13 – 2020-08-16 (×4): 40 mg via ORAL
  Filled 2020-08-13 (×4): qty 1

## 2020-08-13 MED ORDER — SODIUM CHLORIDE 0.9 % IV SOLN
8.0000 mg | Freq: Three times a day (TID) | INTRAVENOUS | Status: DC | PRN
  Administered 2020-08-13 – 2020-08-15 (×3): 8 mg via INTRAVENOUS
  Filled 2020-08-13 (×3): qty 4

## 2020-08-13 MED ORDER — POTASSIUM CHLORIDE CRYS ER 20 MEQ PO TBCR
40.0000 meq | EXTENDED_RELEASE_TABLET | ORAL | Status: AC
  Administered 2020-08-13 (×2): 40 meq via ORAL
  Filled 2020-08-13 (×2): qty 2

## 2020-08-13 MED ORDER — OXYCODONE-ACETAMINOPHEN 5-325 MG PO TABS
1.0000 | ORAL_TABLET | ORAL | Status: DC | PRN
  Administered 2020-08-13 – 2020-08-14 (×7): 2 via ORAL
  Administered 2020-08-15: 1 via ORAL
  Administered 2020-08-15 – 2020-08-18 (×17): 2 via ORAL
  Filled 2020-08-13 (×25): qty 2

## 2020-08-13 MED ORDER — LOPERAMIDE HCL 2 MG PO CAPS
2.0000 mg | ORAL_CAPSULE | Freq: Three times a day (TID) | ORAL | Status: DC | PRN
  Administered 2020-08-15 – 2020-08-17 (×3): 2 mg via ORAL
  Filled 2020-08-13 (×3): qty 1

## 2020-08-13 MED ORDER — SODIUM CHLORIDE 0.9 % IV SOLN
1.0000 g | Freq: Three times a day (TID) | INTRAVENOUS | Status: AC
  Administered 2020-08-13 – 2020-08-18 (×14): 1 g via INTRAVENOUS
  Filled 2020-08-13 (×15): qty 1

## 2020-08-13 NOTE — Progress Notes (Addendum)
[name] Progress Note  Chief Complaint: Generalized abdominal pain  History:  Morgan [name] feels about the same overall, with generalized abdominal pain and nausea.  Her vomiting is stopped, and she was able to eat solid food yesterday including meatloaf. She again asks "when will the pain and nausea stop?"   ROS: Cardiovascular: Denies chest pain Respiratory: Denies dyspnea Urinary: Denies dysuria these are chronic symptoms  Objective:   Current Facility-Administered Medications:  .  acetaminophen (TYLENOL) tablet 650 mg, 650 mg, Oral, Q4H PRN, [name], MD, 650 mg at 08/12/20 2059 .  albuterol (PROVENTIL) (2.5 MG/3ML) 0.083% nebulizer solution 2.5 mg, 2.5 mg, Nebulization, Q2H PRN, Danis, Estill Cotta III, MD .  ARIPiprazole (ABILIFY) tablet 2 mg, 2 mg, Oral, Daily, Rai, Ripudeep K, MD, 2 mg at 08/12/20 0943 .  atorvastatin (LIPITOR) tablet 40 mg, 40 mg, Oral, Daily, Danis, Estill Cotta III, MD, 40 mg at 08/12/20 0943 .  carvedilol (COREG) tablet 3.125 mg, 3.125 mg, Oral, BID WC, Rai, Ripudeep K, MD, 3.125 mg at 08/13/20 0733 .  cefTRIAXone (ROCEPHIN) 2 g in sodium chloride 0.9 % 100 mL IVPB, 2 g, Intravenous, Q24H, Danis, Davell Beckstead L III, MD, Last Rate: 200 mL/hr at 08/12/20 0942, 2 g at 08/12/20 0942 .  FLUoxetine (PROZAC) capsule 40 mg, 40 mg, Oral, Daily, Rai, Ripudeep K, MD, 40 mg at 08/12/20 0943 .  gabapentin (NEURONTIN) capsule 400 mg, 400 mg, Oral, TID, Rai, Ripudeep K, MD, 400 mg at 08/12/20 2100 .  hydrALAZINE (APRESOLINE) injection 10 mg, 10 mg, Intravenous, Q6H PRN, Rai, Ripudeep K, MD .  loperamide (IMODIUM) capsule 2 mg, 2 mg, Oral, Q8H PRN, Rai, Ripudeep K, MD .  mirtazapine (REMERON) tablet 45 mg, 45 mg, Oral, QHS, Rai, Ripudeep K, MD, 45 mg at 08/12/20 2100 .  morphine 2 MG/ML injection 2 mg, 2 mg, Intravenous, Q2H PRN, [name], MD, 2 mg at 08/13/20 0620 .  morphine 2 MG/ML injection 2-4 mg, 2-4 mg, Intravenous, Q4H PRN, Danis, Estill Cotta III, MD .  ondansetron  (ZOFRAN) tablet 4 mg, 4 mg, Oral, Q6H PRN **OR** ondansetron (ZOFRAN) injection 4 mg, 4 mg, Intravenous, Q6H PRN, [name], MD, 4 mg at 08/13/20 0620 .  sodium chloride flush (NS) 0.9 % injection 3 mL, 3 mL, Intravenous, Q12H, Danis, Estill Cotta III, MD, 3 mL at 08/10/20 0906  . cefTRIAXone (ROCEPHIN)  IV 2 g (08/12/20 0942)     Vital signs in last 24 hrs: Vitals:   08/12/20 2215 08/13/20 0624  BP: 115/64 137/72  Pulse: 60 (!) 58  Resp: 15 16  Temp: 97.8 F (36.6 C) 98.1 F (36.7 C)  SpO2: 98% 99%    Intake/Output Summary (Last 24 hours) at 08/13/2020 0930 Last data filed at 08/12/2020 2221 Gross per 24 hour  Intake 350 ml  Output 0 ml  Net 350 ml     Physical Exam Nontoxic.  Alert conversational and pleasant.  Has her hand rubbing her upper abdomen as before  HEENT: sclera anicteric, oral mucosa without lesions  Neck: supple, no thyromegaly, JVD or lymphadenopathy  Cardiac: RRR without murmurs, S1S2 heard, no peripheral edema  Pulm: clear to auscultation bilaterally, normal RR and effort noted  Abdomen: soft, diffuse upper tenderness to light palpation of abdominal wall, with active bowel sounds. No guarding or palpable hepatosplenomegaly  Skin; warm and dry, no jaundice  Recent Labs:  CBC Latest Ref Rng & Units 08/13/2020 08/12/2020 08/11/2020  WBC 4.0 - 10.5  K/uL 7.1 6.3 7.9  Hemoglobin 12.0 - 15.0 g/dL 9.2(L) 9.1(L) 9.0(L)  Hematocrit 36 - 46 % 31.0(L) 29.8(L) 30.8(L)  Platelets 150 - 400 K/uL 268 171 275    No results for input(s): INR in the last 168 hours. CMP Latest Ref Rng & Units 08/13/2020 08/12/2020 08/11/2020  Glucose 70 - 99 mg/dL 100(H) 116(H) 102(H)  BUN 8 - 23 mg/dL 6(L) 8 16  Creatinine 0.44 - 1.00 mg/dL 0.81 0.78 0.77  Sodium 135 - 145 mmol/L 142 141 140  Potassium 3.5 - 5.1 mmol/L 2.9(L) 4.1 3.2(L)  Chloride 98 - 111 mmol/L 105 108 105  CO2 22 - 32 mmol/L 29 24 27   Calcium 8.9 - 10.3 mg/dL 8.2(L) 8.0(L) 7.9(L)  Total Protein 6.5 - 8.1  g/dL - - -  Total Bilirubin 0.3 - 1.2 mg/dL - - -  Alkaline Phos 38 - 126 U/L - - -  AST 15 - 41 U/L - - -  ALT 0 - 44 U/L - - -   C. difficile negative   Assessment & Plan  Assessment: Nausea and vomiting Generalized abdominal pain Diarrhea Chronic pyloric stricture, again balloon dilated this admission but without symptomatic improvement.  Acute on chronic symptoms, probably triggered by acute viral gastroenteritis.  I think the underlying chronic functional bowel symptoms and chronic pain syndrome of the reason she has protracted symptoms during this admission. I am encouraged by her ability to tolerate solid foods for the last 24 hours, and I have tried to help her be encouraged by that as well. I do not think we can make her pain-free nor completely control her nausea prior to discharge given the chronicity of the symptoms. She still reports loose stool, which is probably residual effect of infection and perhaps now exacerbated by antibiotics for her ESBL.  Plan: Recommend a trial of oral opioids such as Percocet or Vicodin rather than morphine to see if that will reasonably control her symptoms to hopefully allow discharge soon.  This would certainly not be a chronic management solution. I have increased the dose of Zofran.  I had rather not give her metoclopramide because she is having diarrhea.  Prn imodium - it is not clear she is asking for that when needed  Potassium repletion  Our service will see this patient again on Monday if she is still hospitalized.  Total time 25 minutes [name] Office: [phone]

## 2020-08-13 NOTE — Progress Notes (Addendum)
Pharmacy Antibiotic Note  Morgan [name] is a 69 y.o. female admitted on 08/09/2020 with UTI.  Pharmacy has been consulted for meropenem dosing. 8/24 UCx: > 100 K ESBL E coli  Plan: Meropenem 1 gm IV q8h x 5 days per MD request Pharmacy to sign off  Height: 6\' 1"  (185.4 cm) Weight: 110.2 kg (243 lb) IBW/kg (Calculated) : 75.4  Temp (24hrs), Avg:98 F (36.7 C), Min:97.8 F (36.6 C), Max:98.2 F (36.8 C)  Recent Labs  Lab 08/09/20 1144 08/10/20 0549 08/11/20 0504 08/12/20 0519 08/13/20 0618  WBC 12.1* 10.3 7.9 6.3 7.1  CREATININE 0.84 0.97 0.77 0.78 0.81  LATICACIDVEN  --  1.0  --   --   --     Estimated Creatinine Clearance: 95 mL/min (by C-G formula based on SCr of 0.81 mg/dL).    Allergies  Allergen Reactions  . Meperidine Hives and Anaphylaxis  . Strawberry Extract Swelling  . Strawberry (Diagnostic) Hives  . Wasp Venom Hives  . Wasp Venom Protein    Thank you for allowing pharmacy to be a part of this patient's care.  Eudelia Bunch, Pharm.D 08/13/2020 2:08 PM

## 2020-08-13 NOTE — Progress Notes (Signed)
[facility]                                                                              Patient Demographics  Morgan [name], is a 69 y.o. female, DOB - 07/13/1951, MBW:[id number]  Admit date - 08/09/2020   Admitting Physician Toy Baker, MD  Outpatient Primary MD for the patient is [name], MD  Outpatient specialists:   LOS - 4  days   Medical records reviewed and are as summarized below:    Chief Complaint  Patient presents with  . Abdominal Pain       Brief summary   Morgan [name] is a 69 y.o. female with medical history significant of COPD on 2L , CHF, CKD, chronic iron deficiency, PUD/partial pyloric stenosis presented with significant nausea, hard time drinking.  Patient also reported acute worsening of her chronic abdominal pain, decreased urine output.   Was hospitalized in June, 2021 for chronic cholecystitis, underwent cholecystectomy.  She was evaluated by GI outpatient, was scheduled for colonoscopy and EGD, findings include gastric stenosis of the pylorus, nonbleeding gastric ulcer, gastritis with hemorrhage, several colon polyps were removed.  She underwent dilation of the pyloric stenosis.   Patient reported nausea, vomiting, abdominal pain, frequent nausea without vomiting since the endoscopy.  Also had diarrhea couple days ago and a day prior to admission. CT abdomen pelvis showed few mildly dilated loops of small bowel in the midabdomen with air-fluid levels, no gastric distention.   UA positive for UTI.  Assessment & Plan    Principal problem  Acute nausea vomiting abdominal pain, diarrhea, partial SBO on CT, enteritis Patient presented with nausea and vomiting  GI and general surgery were consulted, hold off on NG decompression -Underwent EGD 8/26 benign-appearing intrinsic moderate stenosis found at the pylorus, dilated to 15 mm, -Continues to still feel nauseous, sore throat abdominal pain, multiple complaints.  GI  following -Reviewed recommendations, trial of oral Percocet, increase to Zofran, as needed Imodium -Continue soft diet as tolerated   Active problems  History of PUD, pyloric stenosis status post dilation in 06/2020, GERD -Gastric pyloric stenosis dilated on 8/26, continue PPI  Dehydration -DC IV fluids, 4.3 L positive net balance  E. coli acute lower UTI - Urine culture consistent with more than 100,000 colonies of E. coli ESBL, changed to meropenem for 5 days  Hypokalemia - severe K 2.9, placed on K-Dur 40 mEq p.o. 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 pylori, cecal polyp with tubular adenoma without high-grade dysplasia or malignancy    Obesity Estimated body mass index is 32.06 kg/m as calculated from the following:   Height as of this encounter: 6\' 1"  (1.854 m).   Weight as of this encounter: 110.2 kg.  Code Status: Full code DVT Prophylaxis: SCDs Family Communication: Discussed all imaging results, lab results, explained to the patient   Disposition Plan:     Status is: Inpatient  Remains inpatient appropriate because:IV treatments appropriate due to intensity of illness or inability to take PO   Dispo: The patient is  from: Home              Anticipated d/c is to: Home              Anticipated d/c date is: 2 days              Patient currently is not medically stable to d/c.  Advanced to solid diet however still complaining of nausea and vomiting.    Time Spent in minutes 25 minutes  Procedures:  EGD  Consultants:   GI General surgery  Antimicrobials:   Anti-infectives (From admission, onward)   Start     Dose/Rate Route Frequency Ordered Stop   08/10/20 1000  cefTRIAXone (ROCEPHIN) 2 g in sodium chloride 0.9 % 100 mL IVPB        2 g 200 mL/hr over 30 Minutes Intravenous Every 24 hours 08/09/20 2226     08/09/20 2030  cefTRIAXone (ROCEPHIN) 1 g in sodium chloride 0.9 % 100 mL IVPB        1 g 200 mL/hr over 30 Minutes Intravenous  Once 08/09/20 2016 08/09/20 2329         Medications  Scheduled Meds: . ARIPiprazole  2 mg Oral Daily  . atorvastatin  40 mg Oral Daily  . carvedilol  3.125 mg Oral BID WC  . FLUoxetine  40 mg Oral Daily  . furosemide  40 mg Oral Daily  . gabapentin  400 mg Oral TID  . mirtazapine  45 mg Oral QHS  . potassium chloride  40 mEq Oral Q4H  . sodium chloride flush  3 mL Intravenous Q12H   Continuous Infusions: . cefTRIAXone (ROCEPHIN)  IV 2 g (08/13/20 1020)  . ondansetron (ZOFRAN) IV     PRN Meds:.acetaminophen, albuterol, hydrALAZINE, loperamide, morphine injection, morphine injection, ondansetron (ZOFRAN) IV      Subjective:   Morgan [name] was seen and examined today.  Feeling miserable, complaining of abdominal pain, sore throat, nausea.  No chest pain, dizziness, shortness of breath.  No acute events overnight.  No fevers or chills.    Objective:   Vitals:   08/12/20 1225 08/12/20 1416 08/12/20 2215 08/13/20 0624  BP:  130/64 115/64 137/72  Pulse:  (!) 58 60 (!) 58  Resp:  18 15 16   Temp:  98.2 F (36.8 C) 97.8 F (36.6 C) 98.1 F (36.7 C)  TempSrc:  Oral Oral Oral  SpO2: 98% 100% 98% 99%  Weight:      Height:        Intake/Output  Summary (Last 24 hours) at 08/13/2020 1353 Last data filed at 08/13/2020 1020 Gross per 24 hour  Intake 490 ml  Output 0 ml  Net 490 ml     Wt Readings from Last 3 Encounters:  08/11/20 110.2 kg  07/04/20 110.2 kg  06/01/20 111.6 kg  Physical Exam  General: Alert and oriented x 3, NAD, ill-appearing  Cardiovascular: S1 S2 clear, RRR. No pedal edema b/l  Respiratory: Bilateral scattered wheezing  Gastrointestinal: Soft, mild upper abdomen TTP, nondistended, NBS  Ext: no pedal edema bilaterally  Neuro: no new deficits  Musculoskeletal: No cyanosis, clubbing  Skin: No rashes  Psych: anxious  Data Reviewed:  I have personally reviewed following labs and imaging studies  Micro Results Recent Results (from the past 240 hour(s))  SARS Coronavirus 2 by RT PCR (hospital order, performed in [place] hospital lab) Nasopharyngeal Nasopharyngeal Swab     Status: None   Collection Time: 08/09/20  8:59 PM   Specimen: Nasopharyngeal Swab  Result Value Ref Range Status   SARS Coronavirus 2 NEGATIVE NEGATIVE Final    Comment: (NOTE) SARS-CoV-2 target nucleic acids are NOT DETECTED.  The SARS-CoV-2 RNA is generally detectable in upper and lower respiratory specimens during the acute phase of infection. The lowest concentration of SARS-CoV-2 viral copies this assay can detect is 250 copies / mL. A negative result does not preclude SARS-CoV-2 infection and should not be used as the sole basis for treatment or other patient management decisions.  A negative result may occur with improper specimen collection / handling, submission of specimen other than nasopharyngeal swab, presence of viral mutation(s) within the areas targeted by this assay, and inadequate number of viral copies (<250 copies / mL). A negative result must be combined with clinical observations, patient history, and epidemiological information.  Fact Sheet for Patients:    StrictlyIdeas.no  Fact Sheet for Healthcare Providers: BankingDealers.co.za  This test is not yet approved or  cleared by the [place] FDA and has been authorized for detection and/or diagnosis of SARS-CoV-2 by FDA under an Emergency Use Authorization (EUA).  This EUA will remain in effect (meaning this test can be used) for the duration of the COVID-19 declaration under Section 564(b)(1) of the Act, 21 U.S.C. section 360bbb-3(b)(1), unless the authorization is terminated or revoked sooner.  Performed at [facility], [place] [address], [place], [place] [address]   Culture, Urine     Status: Abnormal   Collection Time: 08/09/20  9:12 PM   Specimen: Urine, Clean Catch  Result Value Ref Range Status   Specimen Description   Final    URINE, CLEAN CATCH Performed at [facility], [place] [address], [place], [place] [address]    Special Requests   Final    NONE Performed at [facility], [place] [address], [place], [place] [address]    Culture (A)  Final    >=100,000 COLONIES/mL ESCHERICHIA COLI Confirmed Extended Spectrum Beta-Lactamase Producer (ESBL).  In bloodstream infections from ESBL organisms, carbapenems are preferred over piperacillin/tazobactam. They are shown to have a lower risk of mortality.    Report Status 08/13/2020 FINAL  Final   Organism ID, Bacteria ESCHERICHIA COLI (A)  Final      Susceptibility   Escherichia coli - MIC*    AMPICILLIN >=32 RESISTANT Resistant     CEFAZOLIN >=64 RESISTANT Resistant     CEFTRIAXONE >=64 RESISTANT Resistant     CIPROFLOXACIN >=4 RESISTANT Resistant     GENTAMICIN <=1 SENSITIVE Sensitive     IMIPENEM <=0.25 SENSITIVE Sensitive     NITROFURANTOIN <=16 SENSITIVE Sensitive     TRIMETH/SULFA 40 SENSITIVE Sensitive     AMPICILLIN/SULBACTAM >=32 RESISTANT Resistant     PIP/TAZO <=4 SENSITIVE Sensitive     * >=100,000 COLONIES/mL  ESCHERICHIA  COLI  C Difficile Quick Screen (NO PCR Reflex)     Status: None   Collection Time: 08/12/20  5:47 PM   Specimen: STOOL  Result Value Ref Range Status   C Diff antigen NEGATIVE NEGATIVE Final   C Diff toxin NEGATIVE NEGATIVE Final   C Diff interpretation No C. difficile detected.  Final    Comment: Performed at [facility], [place] [address] [place], [place] [address]    Radiology Reports DG Abd 1 View  Result Date: 08/10/2020 CLINICAL DATA:  Small-bowel obstruction, not feeling better EXAM: ABDOMEN - 1 VIEW COMPARISON:  Portable exam 0838 hours compared to 09/03/2017 FINDINGS: Excreted contrast material within renal collecting systems and bladder. Decreased small bowel distension since prior study. Increased colonic gas. No bowel wall thickening. Surgical clips RIGHT upper quadrant likely reflect cholecystectomy. Bones demineralized. IMPRESSION: Decreased small bowel distension since prior study Electronically Signed   By: Lavonia Dana M.D.   On: 08/10/2020 08:54   CT ABDOMEN PELVIS W CONTRAST  Result Date: 08/09/2020 CLINICAL DATA:  Abdominal distension EXAM: CT ABDOMEN AND PELVIS WITH CONTRAST TECHNIQUE: Multidetector CT imaging of the abdomen and pelvis was performed using the standard protocol following bolus administration of intravenous contrast. CONTRAST:  124mL OMNIPAQUE IOHEXOL 300 MG/ML  SOLN COMPARISON:  05/17/2019 FINDINGS: Lower chest: No acute abnormality. Hepatobiliary: No focal liver abnormality is seen. Status post interval cholecystectomy. No biliary dilatation. Pancreas: Unremarkable. Spleen: Unremarkable. Adrenals/Urinary Tract: Adrenals are unremarkable. Kidneys and partially distended bladder are unremarkable. Stomach/Bowel: Stomach is within normal limits. There are few mildly dilated loops of small bowel with air-fluid levels. Distal colonic diverticulosis. Vascular/Lymphatic: Aortic atherosclerosis. No enlarged lymph nodes identified.  Reproductive: Status post hysterectomy. No adnexal masses. Other: Small volume free fluid in the pelvis presumably related to above. No acute abnormality of the abdominal wall. Musculoskeletal: No acute osseous abnormality. IMPRESSION: Few mildly dilated loops of small bowel in the mid abdomen with air-fluid levels suggesting low-grade obstruction. Interval cholecystectomy. Colonic diverticulosis. Electronically Signed   By: Macy Mis M.D.   On: 08/09/2020 19:47    Lab Data:  CBC: Recent Labs  Lab 08/09/20 1144 08/10/20 0549 08/11/20 0504 08/12/20 0519 08/13/20 0618  WBC 12.1* 10.3 7.9 6.3 7.1  NEUTROABS  --  7.6  --   --   --   HGB 12.3 10.3* 9.0* 9.1* 9.2*  HCT 44.0 34.3* 30.8* 29.8* 31.0*  MCV 88.7 82.5 83.9 81.6 83.3  PLT 215 299 275 171 812   Basic Metabolic Panel: Recent Labs  Lab 08/09/20 1144 08/10/20 0549 08/11/20 0504 08/12/20 0519 08/13/20 0618  NA 141 141 140 141 142  K 3.6 3.1* 3.2* 4.1 2.9*  CL 103 105 105 108 105  CO2 22 27 27 24 29   GLUCOSE 107* 108* 102* 116* 100*  BUN 22 23 16 8  6*  CREATININE 0.84 0.97 0.77 0.78 0.81  CALCIUM 8.8* 8.3* 7.9* 8.0* 8.2*  MG  --  2.3  2.3 2.2  --   --   PHOS  --  2.8  --   --   --    GFR: Estimated Creatinine Clearance: 95 mL/min (by C-G formula based on SCr of 0.81 mg/dL). Liver Function Tests: Recent Labs  Lab 08/09/20 1144 08/10/20 0549  AST 20 54*  ALT 9 22  ALKPHOS 119 116  BILITOT 0.4 0.6  PROT 6.6 6.1*  ALBUMIN 3.3* 3.2*   Recent Labs  Lab 08/09/20 1144  LIPASE 18   No results  for input(s): AMMONIA in the last 168 hours. Coagulation Profile: No results for input(s): INR, PROTIME in the last 168 hours. Cardiac Enzymes: No results for input(s): CKTOTAL, CKMB, CKMBINDEX, TROPONINI in the last 168 hours. BNP (last 3 results) No results for input(s): PROBNP in the last 8760 hours. HbA1C: No results for input(s): HGBA1C in the last 72 hours. CBG: No results for input(s): GLUCAP in the last 168  hours. Lipid Profile: No results for input(s): CHOL, HDL, LDLCALC, TRIG, CHOLHDL, LDLDIRECT in the last 72 hours. Thyroid Function Tests: No results for input(s): TSH, T4TOTAL, FREET4, T3FREE, THYROIDAB in the last 72 hours. Anemia Panel: No results for input(s): VITAMINB12, FOLATE, FERRITIN, TIBC, IRON, RETICCTPCT in the last 72 hours. Urine analysis:    Component Value Date/Time   COLORURINE AMBER (A) 08/09/2020 1842   APPEARANCEUR CLOUDY (A) 08/09/2020 1842   APPEARANCEUR Cloudy 09/18/2012 2000   LABSPEC 1.028 08/09/2020 1842   LABSPEC 1.012 09/18/2012 2000   PHURINE 5.0 08/09/2020 1842   GLUCOSEU NEGATIVE 08/09/2020 1842   GLUCOSEU Negative 09/18/2012 2000   HGBUR MODERATE (A) 08/09/2020 1842   BILIRUBINUR NEGATIVE 08/09/2020 1842   BILIRUBINUR negative 04/08/2019 1103   BILIRUBINUR Negative 09/18/2012 2000   KETONESUR 5 (A) 08/09/2020 1842   PROTEINUR 30 (A) 08/09/2020 1842   UROBILINOGEN 0.2 04/08/2019 1103   UROBILINOGEN 0.2 05/25/2017 1743   NITRITE POSITIVE (A) 08/09/2020 1842   LEUKOCYTESUR LARGE (A) 08/09/2020 1842   LEUKOCYTESUR Trace 09/18/2012 2000     Arihana Ambrocio M.D. [facility] 08/13/2020, 1:53 PM   Call night coverage person covering after 7pm

## 2020-08-14 LAB — BASIC METABOLIC PANEL
Anion gap: 8 (ref 5–15)
BUN: 8 mg/dL (ref 8–23)
CO2: 31 mmol/L (ref 22–32)
Calcium: 8.1 mg/dL — ABNORMAL LOW (ref 8.9–10.3)
Chloride: 103 mmol/L (ref 98–111)
Creatinine, Ser: 1.11 mg/dL — ABNORMAL HIGH (ref 0.44–1.00)
GFR calc Af Amer: 60 mL/min — ABNORMAL LOW (ref >60)
GFR calc non Af Amer: 51 mL/min — ABNORMAL LOW (ref >60)
Glucose, Bld: 97 mg/dL (ref 70–99)
Potassium: 3.3 mmol/L — ABNORMAL LOW (ref 3.5–5.1)
Sodium: 142 mmol/L (ref 135–145)

## 2020-08-14 MED ORDER — POTASSIUM CHLORIDE CRYS ER 20 MEQ PO TBCR
40.0000 meq | EXTENDED_RELEASE_TABLET | Freq: Once | ORAL | Status: AC
  Administered 2020-08-14: 40 meq via ORAL
  Filled 2020-08-14: qty 2

## 2020-08-14 MED ORDER — DICYCLOMINE HCL 10 MG PO CAPS
10.0000 mg | ORAL_CAPSULE | Freq: Three times a day (TID) | ORAL | Status: DC
  Administered 2020-08-14 – 2020-08-15 (×2): 10 mg via ORAL
  Filled 2020-08-14 (×2): qty 1

## 2020-08-14 NOTE — Progress Notes (Signed)
Occupational Therapy Treatment Patient Details Name: Morgan [name] MRN: [id number] DOB: July 20, 1951 Today's Date: 08/14/2020    History of present illness Morgan [name] is a 69 y.o. female with medical history significant of COPD on 2L , CHF, CKD, chronic iron deficiency, PUD/partial pyloric stenosis presented with significant nausea, hard time drinking.  Patient also reported acute worsening of her chronic abdominal pain, decreased urine output.   Was hospitalized in June, 2021 for chronic cholecystitis, underwent cholecystectomy.  She was evaluated by GI outpatient, was scheduled for colonoscopy and EGD, findings include gastric stenosis of the pylorus, nonbleeding gastric ulcer, gastritis with hemorrhage, several colon polyps were removed.  She underwent dilation of the pyloric stenosis.   OT comments  Patient continues to report abdominal pain and overall feeling weak but demonstrates ability to perform functional mobility and toileting with supervision. Patient uses one hand hold to ambulate around room while other hand pushes IV pole. Patient reports mild dizziness with eye movement, especially down, and with standing but no overt loss of balance. Therapist reiterated patient need to f/u with vestibular rehab as PT recommended. Patient has progressed from min guard to supervision. Will continue to follow while in hospital. No home needs.    Follow Up Recommendations  No OT follow up    Equipment Recommendations  None recommended by OT    Recommendations for Other Services      Precautions / Restrictions Precautions Precaution Comments: Dizzy with eye movement, standing       Mobility Bed Mobility Overal bed mobility: Independent                Transfers Overall transfer level: Modified independent Equipment used: None Transfers: Sit to/from Stand Sit to Stand: Modified independent (Device/Increase time)         General transfer comment: Supervision for ambulation in  room. Patient ambulating with IV pole and using hand holds on wall and furniture. reports mild dizziness.    Balance Overall balance assessment: Needs assistance Sitting-balance support: No upper extremity supported;Feet supported Sitting balance-Leahy Scale: Good     Standing balance support: Single extremity supported Standing balance-Leahy Scale: Fair Standing balance comment: No hand hold needed with static standing, one hand hold with ambulation.                           ADL either performed or assessed with clinical judgement   ADL                           Toilet Transfer: Supervision/safety;Grab Haematologist;Ambulation   Toileting- Clothing Manipulation and Hygiene: Supervision/safety;Sit to/from stand       Functional mobility during ADLs: Supervision/safety General ADL Comments: supervision with patient using hand holds on wall and furniture as well as IV pole.     Vision   Vision Assessment?: No apparent visual deficits   Perception     Praxis      Cognition Arousal/Alertness: Awake/alert Behavior During Therapy: WFL for tasks assessed/performed Overall Cognitive Status: Within Functional Limits for tasks assessed                                          Exercises     Shoulder Instructions       General Comments      Pertinent Vitals/ Pain  Pain Assessment: Faces Faces Pain Scale: Hurts a little bit Pain Location: abdomen Pain Descriptors / Indicators: Discomfort;Grimacing  Home Living                                          Prior Functioning/Environment              Frequency           Progress Toward Goals  OT Goals(current goals can now be found in the care plan section)  Progress towards OT goals: Progressing toward goals  Acute Rehab OT Goals Patient Stated Goal: To feel better OT Goal Formulation: With patient Time For Goal Achievement:  08/25/20 Potential to Achieve Goals: Good  Plan Discharge plan remains appropriate    Co-evaluation                 AM-PAC OT "6 Clicks" Daily Activity     Outcome Measure   Help from another person eating meals?: None Help from another person taking care of personal grooming?: A Little Help from another person toileting, which includes using toliet, bedpan, or urinal?: A Little Help from another person bathing (including washing, rinsing, drying)?: A Little Help from another person to put on and taking off regular upper body clothing?: A Little Help from another person to put on and taking off regular lower body clothing?: A Little 6 Click Score: 19    End of Session    OT Visit Diagnosis: Muscle weakness (generalized) (M62.81);Pain Pain - part of body:  (abdomen)   Activity Tolerance Patient tolerated treatment well   Patient Left in chair;with call bell/phone within reach   Nurse Communication Other (comment) (requests for bed change and drink)        Time: 1537-9432 OT Time Calculation (min): 17 min  Charges: OT General Charges $OT Visit: 1 Visit OT Treatments $Self Care/Home Management : 8-22 mins  Morgan [name], OTR/L Morgan [name]  Office [phone] Pager: [place] 08/14/2020, 4:03 PM

## 2020-08-14 NOTE — Progress Notes (Addendum)
[facility]                                                                              Patient Demographics  Morgan [name], is a 69 y.o. female, DOB - May 29, 1951, BJY:[id number]  Admit date - 08/09/2020   Admitting Physician Toy Baker, MD  Outpatient Primary MD for the patient is [name], MD  Outpatient specialists:   LOS - 5  days   Medical records reviewed and are as summarized below:    Chief Complaint  Patient presents with   Abdominal Pain       Brief summary   Morgan [name] is a 69 y.o. female with medical history significant of COPD on 2L , CHF, CKD, chronic iron deficiency, PUD/partial pyloric stenosis presented with significant nausea, hard time drinking.  Patient also reported acute worsening of her chronic abdominal pain, decreased urine output.   Was hospitalized in June, 2021 for chronic cholecystitis, underwent cholecystectomy.  She was evaluated by GI outpatient, was scheduled for colonoscopy and EGD, findings include gastric stenosis of the pylorus, nonbleeding gastric ulcer, gastritis with hemorrhage, several colon polyps were removed.  She underwent dilation of the pyloric stenosis.   Patient reported nausea, vomiting, abdominal pain, frequent nausea without vomiting since the endoscopy.  Also had diarrhea couple days ago and a day prior to admission. CT abdomen pelvis showed few mildly dilated loops of small bowel in the midabdomen with air-fluid levels, no gastric distention.   UA positive for UTI.  Assessment & Plan    Principal problem  Acute nausea vomiting abdominal pain, diarrhea, partial SBO on CT, enteritis Patient presented with nausea and vomiting  GI and general surgery were consulted -Underwent EGD 8/26 benign-appearing intrinsic moderate stenosis found at the pylorus, dilated to 15 mm, -Continues to feel nauseous, states had a vomiting episode this morning, abdominal pain, will try Bentyl 10 mg TID -Discussed  with GI, Dr. Loletha Carrow and patient, she appears to have these chronic symptoms with nausea vomiting and abdominal pain -Continue Percocet, Zofran, as needed Imodium.  C. difficile negative. - , Continue soft diet   Active problems  History of PUD, pyloric stenosis status post dilation in 06/2020, GERD -Gastric pyloric stenosis dilated on 8/26, continue PPI  Dehydration -Resolved, IV fluids discontinued  E. coli acute lower UTI - Urine culture consistent with more than 100,000 colonies of E. 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 pylori, cecal  polyp with tubular adenoma without high-grade dysplasia or malignancy    Obesity Estimated body mass index is 32.06 kg/m as calculated from the following:   Height as of this encounter: 6\' 1"  (1.854 m).   Weight as of this encounter: 110.2 kg.  Code Status: Full code DVT Prophylaxis: SCDs Family Communication: Discussed all imaging results, lab results, explained to the patient   Disposition Plan:     Status is: Inpatient  Remains inpatient appropriate because:IV treatments appropriate due to intensity of illness or inability to take PO   Dispo: The patient is from: Home              Anticipated d/c is to: Home              Anticipated d/c  date is: 2 days              Patient currently is not medically stable to d/c.  Advanced to solid diet however still complaining of nausea and vomiting.    Time Spent in minutes 25 minutes  Procedures:  EGD  Consultants:   GI General surgery  Antimicrobials:   Anti-infectives (From admission, onward)   Start     Dose/Rate Route Frequency Ordered Stop   08/13/20 1430  meropenem (MERREM) 1 g in sodium chloride 0.9 % 100 mL IVPB        1 g 200 mL/hr over 30 Minutes Intravenous Every 8 hours 08/13/20 1406 08/18/20 1359   08/10/20 1000  cefTRIAXone (ROCEPHIN) 2 g in sodium chloride 0.9 % 100 mL IVPB  Status:  Discontinued        2 g 200 mL/hr over 30 Minutes Intravenous Every 24 hours 08/09/20 2226 08/13/20 1357   08/09/20 2030  cefTRIAXone (ROCEPHIN) 1 g in sodium chloride 0.9 % 100 mL IVPB        1 g 200 mL/hr over 30 Minutes Intravenous  Once 08/09/20 2016 08/09/20 2329         Medications  Scheduled Meds:  ARIPiprazole  2 mg Oral Daily   atorvastatin  40 mg Oral Daily   carvedilol  3.125 mg Oral BID WC   FLUoxetine  40 mg Oral Daily   furosemide  40 mg Oral Daily   gabapentin  400 mg Oral TID   mirtazapine  45 mg Oral QHS   sodium chloride flush  3 mL Intravenous Q12H   Continuous Infusions:  meropenem (MERREM) IV 1 g (08/14/20 0601)   ondansetron (ZOFRAN) IV 8 mg (08/14/20 0936)   PRN Meds:.acetaminophen, albuterol, hydrALAZINE, loperamide, ondansetron (ZOFRAN) IV, oxyCODONE-acetaminophen      Subjective:   Boni Maclellan was seen and examined today.  Continues to feel miserable and poorly.  States has nausea and abdominal pain.  Had one episode of vomiting this morning.  No fevers or chills.  Sore throat is improving.    Objective:   Vitals:   08/13/20 0624 08/13/20 1704 08/13/20 2131 08/14/20 0627  BP: 137/72 125/69 (!) 109/48 127/74  Pulse: (!) 58 (!) 55 (!) 58 (!) 59  Resp: 16 16 14    Temp: 98.1 F (36.7 C) 98.2 F (36.8 C) 98.4 F (36.9  C) 98.2 F (36.8 C)  TempSrc: Oral Oral Oral Oral  SpO2: 99% 99% 97% 99%  Weight:      Height:        Intake/Output Summary (Last 24 hours) at 08/14/2020 1225 Last data filed at 08/14/2020 0601 Gross per 24 hour  Intake 671.99 ml  Output 0  ml  Net 671.99 ml     Wt Readings from Last 3 Encounters:  08/11/20 110.2 kg  07/04/20 110.2 kg  06/01/20 111.6 kg   Physical Exam  General: Alert and oriented x 3, ill-appearing  Cardiovascular: S1 S2 clear, RRR. No pedal edema b/l  Respiratory: CTAB, no wheezing, rales or rhonchi  Gastrointestinal: Soft, diffuse TTP, no peritoneal signs, nondistended, NBS  Ext: no pedal edema bilaterally  Neuro: no new deficits  Musculoskeletal: No cyanosis, clubbing  Skin: No rashes  Psych: anxious   Data Reviewed:  I have personally reviewed following labs and imaging studies  Micro Results Recent Results (from the past 240 hour(s))  SARS Coronavirus 2 by RT PCR (hospital order, performed in [facility] hospital lab) Nasopharyngeal Nasopharyngeal Swab     Status: None   Collection Time: 08/09/20  8:59 PM   Specimen: Nasopharyngeal Swab  Result Value Ref Range Status   SARS Coronavirus 2 NEGATIVE NEGATIVE Final    Comment: (NOTE) SARS-CoV-2 target nucleic acids are NOT DETECTED.  The SARS-CoV-2 RNA is generally detectable in upper and lower respiratory specimens during the acute phase of infection. The lowest concentration of SARS-CoV-2 viral copies this assay can detect is 250 copies / mL. A negative result does not preclude SARS-CoV-2 infection and should not be used as the sole basis for treatment or other patient management decisions.  A negative result may occur with improper specimen collection / handling, submission of specimen other than nasopharyngeal swab, presence of viral mutation(s) within the areas targeted by this assay, and inadequate number of viral copies (<250 copies / mL). A negative result must be combined with  clinical observations, patient history, and epidemiological information.  Fact Sheet for Patients:   StrictlyIdeas.no  Fact Sheet for Healthcare Providers: BankingDealers.co.za  This test is not yet approved or  cleared by the [place] FDA and has been authorized for detection and/or diagnosis of SARS-CoV-2 by FDA under an Emergency Use Authorization (EUA).  This EUA will remain in effect (meaning this test can be used) for the duration of the COVID-19 declaration under Section 564(b)(1) of the Act, 21 U.S.C. section 360bbb-3(b)(1), unless the authorization is terminated or revoked sooner.  Performed at [facility], [place], [place] [address] [place], [place] [address]   Culture, Urine     Status: Abnormal   Collection Time: 08/09/20  9:12 PM   Specimen: Urine, Clean Catch  Result Value Ref Range Status   Specimen Description   Final    URINE, CLEAN CATCH Performed at [facility], [place] [address] [place], [place] [address]    Special Requests   Final    NONE Performed at [facility], [place] [address], [place], [place] [address]    Culture (A)  Final    >=100,000 COLONIES/mL ESCHERICHIA COLI Confirmed Extended Spectrum Beta-Lactamase Producer (ESBL).  In bloodstream infections from ESBL organisms, carbapenems are preferred over piperacillin/tazobactam. They are shown to have a lower risk of mortality.    Report Status 08/13/2020 FINAL  Final   Organism ID, Bacteria ESCHERICHIA COLI (A)  Final      Susceptibility   Escherichia coli - MIC*    AMPICILLIN >=32 RESISTANT Resistant     CEFAZOLIN >=64 RESISTANT Resistant     CEFTRIAXONE >=64 RESISTANT Resistant     CIPROFLOXACIN >=4 RESISTANT Resistant     GENTAMICIN <=1 SENSITIVE Sensitive     IMIPENEM <=0.25 SENSITIVE Sensitive     NITROFURANTOIN <=16 SENSITIVE Sensitive  TRIMETH/SULFA 40 SENSITIVE Sensitive      AMPICILLIN/SULBACTAM >=32 RESISTANT Resistant     PIP/TAZO <=4 SENSITIVE Sensitive     * >=100,000 COLONIES/mL ESCHERICHIA COLI  C Difficile Quick Screen (NO PCR Reflex)     Status: None   Collection Time: 08/12/20  5:47 PM   Specimen: STOOL  Result Value Ref Range Status   C Diff antigen NEGATIVE NEGATIVE Final   C Diff toxin NEGATIVE NEGATIVE Final   C Diff interpretation No C. difficile detected.  Final    Comment: Performed at [facility], [place] [address] [place], [place] [address]    Radiology Reports DG Abd 1 View  Result Date: 08/10/2020 CLINICAL DATA:  Small-bowel obstruction, not feeling better EXAM: ABDOMEN - 1 VIEW COMPARISON:  Portable exam 0838 hours compared to 09/03/2017 FINDINGS: Excreted contrast material within renal collecting systems and bladder. Decreased small bowel distension since prior study. Increased colonic gas. No bowel wall thickening. Surgical clips RIGHT upper quadrant likely reflect cholecystectomy. Bones demineralized. IMPRESSION: Decreased small bowel distension since prior study Electronically Signed   By: Lavonia Dana M.D.   On: 08/10/2020 08:54   CT ABDOMEN PELVIS W CONTRAST  Result Date: 08/09/2020 CLINICAL DATA:  Abdominal distension EXAM: CT ABDOMEN AND PELVIS WITH CONTRAST TECHNIQUE: Multidetector CT imaging of the abdomen and pelvis was performed using the standard protocol following bolus administration of intravenous contrast. CONTRAST:  150mL OMNIPAQUE IOHEXOL 300 MG/ML  SOLN COMPARISON:  05/17/2019 FINDINGS: Lower chest: No acute abnormality. Hepatobiliary: No focal liver abnormality is seen. Status post interval cholecystectomy. No biliary dilatation. Pancreas: Unremarkable. Spleen: Unremarkable. Adrenals/Urinary Tract: Adrenals are unremarkable. Kidneys and partially distended bladder are unremarkable. Stomach/Bowel: Stomach is within normal limits. There are few mildly dilated loops of small bowel with air-fluid levels. Distal  colonic diverticulosis. Vascular/Lymphatic: Aortic atherosclerosis. No enlarged lymph nodes identified. Reproductive: Status post hysterectomy. No adnexal masses. Other: Small volume free fluid in the pelvis presumably related to above. No acute abnormality of the abdominal wall. Musculoskeletal: No acute osseous abnormality. IMPRESSION: Few mildly dilated loops of small bowel in the mid abdomen with air-fluid levels suggesting low-grade obstruction. Interval cholecystectomy. Colonic diverticulosis. Electronically Signed   By: Macy Mis M.D.   On: 08/09/2020 19:47    Lab Data:  CBC: Recent Labs  Lab 08/09/20 1144 08/10/20 0549 08/11/20 0504 08/12/20 0519 08/13/20 0618  WBC 12.1* 10.3 7.9 6.3 7.1  NEUTROABS  --  7.6  --   --   --   HGB 12.3 10.3* 9.0* 9.1* 9.2*  HCT 44.0 34.3* 30.8* 29.8* 31.0*  MCV 88.7 82.5 83.9 81.6 83.3  PLT 215 299 275 171 235   Basic Metabolic Panel: Recent Labs  Lab 08/10/20 0549 08/11/20 0504 08/12/20 0519 08/13/20 0618 08/14/20 0531  NA 141 140 141 142 142  K 3.1* 3.2* 4.1 2.9* 3.3*  CL 105 105 108 105 103  CO2 27 27 24 29 31   GLUCOSE 108* 102* 116* 100* 97  BUN 23 16 8  6* 8  CREATININE 0.97 0.77 0.78 0.81 1.11*  CALCIUM 8.3* 7.9* 8.0* 8.2* 8.1*  MG 2.3   2.3 2.2  --   --   --   PHOS 2.8  --   --   --   --    GFR: Estimated Creatinine Clearance: 69.3 mL/min (A) (by C-G formula based on SCr of 1.11 mg/dL (H)). Liver Function Tests: Recent Labs  Lab 08/09/20 1144 08/10/20 0549  AST 20 54*  ALT 9  22  ALKPHOS 119 116  BILITOT 0.4 0.6  PROT 6.6 6.1*  ALBUMIN 3.3* 3.2*   Recent Labs  Lab 08/09/20 1144  LIPASE 18   No results for input(s): AMMONIA in the last 168 hours. Coagulation Profile: No results for input(s): INR, PROTIME in the last 168 hours. Cardiac Enzymes: No results for input(s): CKTOTAL, CKMB, CKMBINDEX, TROPONINI in the last 168 hours. BNP (last 3 results) No results for input(s): PROBNP in the last 8760  hours. HbA1C: No results for input(s): HGBA1C in the last 72 hours. CBG: No results for input(s): GLUCAP in the last 168 hours. Lipid Profile: No results for input(s): CHOL, HDL, LDLCALC, TRIG, CHOLHDL, LDLDIRECT in the last 72 hours. Thyroid Function Tests: No results for input(s): TSH, T4TOTAL, FREET4, T3FREE, THYROIDAB in the last 72 hours. Anemia Panel: No results for input(s): VITAMINB12, FOLATE, FERRITIN, TIBC, IRON, RETICCTPCT in the last 72 hours. Urine analysis:    Component Value Date/Time   COLORURINE AMBER (A) 08/09/2020 1842   APPEARANCEUR CLOUDY (A) 08/09/2020 1842   APPEARANCEUR Cloudy 09/18/2012 2000   LABSPEC 1.028 08/09/2020 1842   LABSPEC 1.012 09/18/2012 2000   PHURINE 5.0 08/09/2020 1842   GLUCOSEU NEGATIVE 08/09/2020 1842   GLUCOSEU Negative 09/18/2012 2000   HGBUR MODERATE (A) 08/09/2020 1842   BILIRUBINUR NEGATIVE 08/09/2020 1842   BILIRUBINUR negative 04/08/2019 1103   BILIRUBINUR Negative 09/18/2012 2000   KETONESUR 5 (A) 08/09/2020 1842   PROTEINUR 30 (A) 08/09/2020 1842   UROBILINOGEN 0.2 04/08/2019 1103   UROBILINOGEN 0.2 05/25/2017 1743   NITRITE POSITIVE (A) 08/09/2020 1842   LEUKOCYTESUR LARGE (A) 08/09/2020 1842   LEUKOCYTESUR Trace 09/18/2012 2000     Tiffani Kadow M.D. [facility] 08/14/2020, 12:25 PM   Call night coverage person covering after 7pm

## 2020-08-15 DIAGNOSIS — R1032 Left lower quadrant pain: Secondary | ICD-10-CM

## 2020-08-15 DIAGNOSIS — N181 Chronic kidney disease, stage 1: Secondary | ICD-10-CM

## 2020-08-15 LAB — BASIC METABOLIC PANEL
Anion gap: 7 (ref 5–15)
BUN: 8 mg/dL (ref 8–23)
CO2: 36 mmol/L — ABNORMAL HIGH (ref 22–32)
Calcium: 8.4 mg/dL — ABNORMAL LOW (ref 8.9–10.3)
Chloride: 99 mmol/L (ref 98–111)
Creatinine, Ser: 1.01 mg/dL — ABNORMAL HIGH (ref 0.44–1.00)
GFR calc Af Amer: >60 mL/min (ref >60)
GFR calc non Af Amer: 58 mL/min — ABNORMAL LOW (ref >60)
Glucose, Bld: 99 mg/dL (ref 70–99)
Potassium: 3.1 mmol/L — ABNORMAL LOW (ref 3.5–5.1)
Sodium: 142 mmol/L (ref 135–145)

## 2020-08-15 MED ORDER — ALUM & MAG HYDROXIDE-SIMETH 200-200-20 MG/5ML PO SUSP
30.0000 mL | ORAL | Status: DC | PRN
  Administered 2020-08-15: 30 mL via ORAL
  Filled 2020-08-15: qty 30

## 2020-08-15 MED ORDER — SODIUM CHLORIDE 0.9 % IV SOLN
INTRAVENOUS | Status: DC | PRN
  Administered 2020-08-15: 14:00:00 250 mL via INTRAVENOUS

## 2020-08-15 MED ORDER — POTASSIUM CHLORIDE 20 MEQ PO PACK: 40.0000 meq | PACK | Freq: Once | ORAL | Status: DC

## 2020-08-15 MED ORDER — ONDANSETRON HCL 4 MG PO TABS
8.0000 mg | ORAL_TABLET | Freq: Three times a day (TID) | ORAL | Status: DC
  Administered 2020-08-16 – 2020-08-18 (×8): 8 mg via ORAL
  Filled 2020-08-15 (×10): qty 2

## 2020-08-15 MED ORDER — DICYCLOMINE HCL 10 MG PO CAPS
20.0000 mg | ORAL_CAPSULE | Freq: Three times a day (TID) | ORAL | Status: DC
  Administered 2020-08-15 – 2020-08-18 (×10): 20 mg via ORAL
  Filled 2020-08-15 (×10): qty 2

## 2020-08-15 MED ORDER — PANTOPRAZOLE SODIUM 40 MG PO TBEC
40.0000 mg | DELAYED_RELEASE_TABLET | Freq: Two times a day (BID) | ORAL | Status: DC
  Administered 2020-08-15 – 2020-08-18 (×7): 40 mg via ORAL
  Filled 2020-08-15 (×7): qty 1

## 2020-08-15 MED ORDER — OXYCODONE-ACETAMINOPHEN 5-325 MG PO TABS
1.0000 | ORAL_TABLET | Freq: Once | ORAL | Status: AC
  Administered 2020-08-15: 1 via ORAL
  Filled 2020-08-15: qty 1

## 2020-08-15 MED ORDER — METRONIDAZOLE IN NACL 5-0.79 MG/ML-% IV SOLN: 500.0000 mg | Freq: Three times a day (TID) | INTRAVENOUS | Status: DC

## 2020-08-15 NOTE — Progress Notes (Addendum)
    Progress Note   Subjective  Tolerating soft diet with persistent but improved N/V. Continued chronic abdominal pain. Loose stools post meals.   Objective  Vital signs in last 24 hours: Temp:  [98.1 F (36.7 C)-98.4 F (36.9 C)] 98.1 F (36.7 C) (08/30 0651) Pulse Rate:  [58-61] 58 (08/30 0651) Resp:  [16-17] 17 (08/30 0651) BP: (126-144)/(70-77) 144/77 (08/30 0651) SpO2:  [97 %-100 %] 99 % (08/30 0651) Last BM Date: 08/14/20  General: Alert, well-developed, in NAD Heart:  Regular rate and rhythm; no murmurs Chest: Clear to ascultation bilaterally Abdomen:  Soft, diffuse mild tenderness to palpation and nondistended. Normal bowel sounds, without guarding, and without rebound.   Extremities:  Without edema. Neurologic:  Alert and  oriented x4; grossly normal neurologically. Psych:  Alert and cooperative. Normal mood and affect.  Intake/Output from previous day: 08/29 0701 - 08/30 0700 In: 594 [P.O.:240; IV Piggyback:354] Out: -  Intake/Output this shift: Total I/O In: 120 [P.O.:120] Out: -   Lab Results: Recent Labs    08/13/20 0618  WBC 7.1  HGB 9.2*  HCT 31.0*  PLT 268   BMET Recent Labs    08/13/20 0618 08/14/20 0531 08/15/20 0606  NA 142 142 142  K 2.9* 3.3* 3.1*  CL 105 103 99  CO2 29 31 36*  GLUCOSE 100* 97 99  BUN 6* 8 8  CREATININE 0.81 1.11* 1.01*  CALCIUM 8.2* 8.1* 8.4*      Assessment & Recommendations   1. Chronic abdominal pain, post prandial loose stools. No additional GI evaluation is planned at this time. Treating UTI. Increase dicyclomine to 20 mg po tid ac.   2. Pyloric stenosis dilated on 8/26. Add pantoprazole 40 mg bid for now and then qam at discharge.   3. Nausea, vomiting has improved but persists. Treating UTI. Pantoprazole as above. Change Zofran 8 mg po tid ac (not prn) for several days then tid prn. Maintain soft diet for now and at discharge. GI signing off. Outpatient GI follow up with Denzil Magnuson, MD.   4.  Hypokalemia. Per primary service.   5. UTI could be contributing to GI symptoms. Per primary service.     LOS: 6 days   Norberto Sorenson T. Fuller Plan MD  08/15/2020, 10:48 AM

## 2020-08-15 NOTE — Care Management Important Message (Signed)
Important Message  Patient Details IM Letter given to the Patient Name: Morgan [name] MRN: [id number] Date of Birth: [birth year]   Medicare Important Message Given:  Yes     Kerin Salen 08/15/2020, 10:42 AM

## 2020-08-15 NOTE — Progress Notes (Addendum)
PROGRESS NOTE    Morgan [name]  VOJ:[id number]  DOB: 02-Aug-1951  PCP: [name], MD Admit date:08/09/2020 Chief compliant: Abdominal pain, nausea 69 y.o.femalewith medical history significant of COPD on 2L , CHF, CKD, chronic iron deficiency, PUD/partial pyloric stenosis presented with significant nausea, poor oral intake.  Patient also reported acute worsening of her chronic abdominal pain, decreased urine output.  Was hospitalized in June, 2021 for chronic cholecystitis, underwent cholecystectomy.  She was evaluated by GI outpatient, was scheduled for colonoscopy and EGD, findings include gastric stenosis of the pylorus, nonbleeding gastric ulcer, gastritis with hemorrhage, several colon polyps were removed.  She underwent dilation of the pyloric stenosis.  Patient reported nausea, vomiting, abdominal pain, frequent nausea without vomiting since the endoscopy.  Also had diarrhea couple days ago and a day prior to admission. CT abdomen pelvis showed few mildly dilated loops of small bowel in the midabdomen with air-fluid levels, no gastric distention.   UA positive for UTI.   Subjective:  Patient resting comfortably.  She reports left lower quadrant abdominal pain and some suprapubic pain.  She required an extra dose of oxycodone earlier this morning.  Has had loose stool/diarrhea over the last couple of days.  Stool C. difficile negative.  Had 3 loose BM this morning.  Seen by GI.  Objective: Vitals:   08/14/20 1551 08/14/20 2149 08/15/20 0651 08/15/20 1434  BP:  126/70 (!) 144/77 132/65  Pulse:  (!) 59 (!) 58 62  Resp:  17 17 18   Temp:  98.4 F (36.9 C) 98.1 F (36.7 C) 98 F (36.7 C)  TempSrc:  Oral Oral Oral  SpO2: 98% 97% 99% 100%  Weight:      Height:        Intake/Output Summary (Last 24 hours) at 08/15/2020 1441 Last data filed at 08/15/2020 1437 Gross per 24 hour  Intake 940 ml  Output --  Net 940 ml   Filed Weights   08/09/20 0647 08/11/20 1011  Weight:  110.2 kg 110.2 kg    Physical Examination:  General: Moderately built, no acute distress noted Head ENT: Atraumatic normocephalic, PERRLA, neck supple Heart: S1-S2 heard, regular rate and rhythm, no murmurs.  No leg edema noted Lungs: Equal air entry bilaterally, no rhonchi or rales on exam, no accessory muscle use Abdomen: Soft overall, left lower quadrant tenderness mostly, has some suprapubic/right lower quadrant tenderness as well.  No rebound or guarding, nondistended. No organomegaly.  No CVA tenderness Extremities: No pedal edema.  No cyanosis or clubbing. Neurological: Awake alert oriented x3, no focal weakness or numbness, strength and sensations to crude touch intact Skin: No wounds or rashes.   Data Reviewed: I have personally reviewed following labs and imaging studies  CBC: Recent Labs  Lab 08/09/20 1144 08/10/20 0549 08/11/20 0504 08/12/20 0519 08/13/20 0618  WBC 12.1* 10.3 7.9 6.3 7.1  NEUTROABS  --  7.6  --   --   --   HGB 12.3 10.3* 9.0* 9.1* 9.2*  HCT 44.0 34.3* 30.8* 29.8* 31.0*  MCV 88.7 82.5 83.9 81.6 83.3  PLT 215 299 275 171 993   Basic Metabolic Panel: Recent Labs  Lab 08/10/20 0549 08/10/20 0549 08/11/20 0504 08/12/20 0519 08/13/20 0618 08/14/20 0531 08/15/20 0606  NA 141   < > 140 141 142 142 142  K 3.1*   < > 3.2* 4.1 2.9* 3.3* 3.1*  CL 105   < > 105 108 105 103 99  CO2 27   < >  27 24 29 31  36*  GLUCOSE 108*   < > 102* 116* 100* 97 99  BUN 23   < > 16 8 6* 8 8  CREATININE 0.97   < > 0.77 0.78 0.81 1.11* 1.01*  CALCIUM 8.3*   < > 7.9* 8.0* 8.2* 8.1* 8.4*  MG 2.3  2.3  --  2.2  --   --   --   --   PHOS 2.8  --   --   --   --   --   --    < > = values in this interval not displayed.   GFR: Estimated Creatinine Clearance: 76.2 mL/min (A) (by C-G formula based on SCr of 1.01 mg/dL (H)). Liver Function Tests: Recent Labs  Lab 08/09/20 1144 08/10/20 0549  AST 20 54*  ALT 9 22  ALKPHOS 119 116  BILITOT 0.4 0.6  PROT 6.6 6.1*    ALBUMIN 3.3* 3.2*   Recent Labs  Lab 08/09/20 1144  LIPASE 18   No results for input(s): AMMONIA in the last 168 hours. Coagulation Profile: No results for input(s): INR, PROTIME in the last 168 hours. Cardiac Enzymes: No results for input(s): CKTOTAL, CKMB, CKMBINDEX, TROPONINI in the last 168 hours. BNP (last 3 results) No results for input(s): PROBNP in the last 8760 hours. HbA1C: No results for input(s): HGBA1C in the last 72 hours. CBG: No results for input(s): GLUCAP in the last 168 hours. Lipid Profile: No results for input(s): CHOL, HDL, LDLCALC, TRIG, CHOLHDL, LDLDIRECT in the last 72 hours. Thyroid Function Tests: No results for input(s): TSH, T4TOTAL, FREET4, T3FREE, THYROIDAB in the last 72 hours. Anemia Panel: No results for input(s): VITAMINB12, FOLATE, FERRITIN, TIBC, IRON, RETICCTPCT in the last 72 hours. Sepsis Labs: Recent Labs  Lab 08/10/20 0549  LATICACIDVEN 1.0    Recent Results (from the past 240 hour(s))  SARS Coronavirus 2 by RT PCR (hospital order, performed in [facility] hospital lab) Nasopharyngeal Nasopharyngeal Swab     Status: None   Collection Time: 08/09/20  8:59 PM   Specimen: Nasopharyngeal Swab  Result Value Ref Range Status   SARS Coronavirus 2 NEGATIVE NEGATIVE Final    Comment: (NOTE) SARS-CoV-2 target nucleic acids are NOT DETECTED.  The SARS-CoV-2 RNA is generally detectable in upper and lower respiratory specimens during the acute phase of infection. The lowest concentration of SARS-CoV-2 viral copies this assay can detect is 250 copies / mL. A negative result does not preclude SARS-CoV-2 infection and should not be used as the sole basis for treatment or other patient management decisions.  A negative result may occur with improper specimen collection / handling, submission of specimen other than nasopharyngeal swab, presence of viral mutation(s) within the areas targeted by this assay, and inadequate number of viral  copies (<250 copies / mL). A negative result must be combined with clinical observations, patient history, and epidemiological information.  Fact Sheet for Patients:   StrictlyIdeas.no  Fact Sheet for Healthcare Providers: BankingDealers.co.za  This test is not yet approved or  cleared by the [place] FDA and has been authorized for detection and/or diagnosis of SARS-CoV-2 by FDA under an Emergency Use Authorization (EUA).  This EUA will remain in effect (meaning this test can be used) for the duration of the COVID-19 declaration under Section 564(b)(1) of the Act, 21 U.S.C. section 360bbb-3(b)(1), unless the authorization is terminated or revoked sooner.  Performed at [facility], [place] [address], [place], [place] [address]  Culture, Urine     Status: Abnormal   Collection Time: 08/09/20  9:12 PM   Specimen: Urine, Clean Catch  Result Value Ref Range Status   Specimen Description   Final    URINE, CLEAN CATCH Performed at [facility], [place] [address], [place], [place] [address]    Special Requests   Final    NONE Performed at [facility], [place] [address] [place], [place] [address]    Culture (A)  Final    >=100,000 COLONIES/mL ESCHERICHIA COLI Confirmed Extended Spectrum Beta-Lactamase Producer (ESBL).  In bloodstream infections from ESBL organisms, carbapenems are preferred over piperacillin/tazobactam. They are shown to have a lower risk of mortality.    Report Status 08/13/2020 FINAL  Final   Organism ID, Bacteria ESCHERICHIA COLI (A)  Final      Susceptibility   Escherichia coli - MIC*    AMPICILLIN >=32 RESISTANT Resistant     CEFAZOLIN >=64 RESISTANT Resistant     CEFTRIAXONE >=64 RESISTANT Resistant     CIPROFLOXACIN >=4 RESISTANT Resistant     GENTAMICIN <=1 SENSITIVE Sensitive     IMIPENEM <=0.25 SENSITIVE Sensitive     NITROFURANTOIN <=16 SENSITIVE  Sensitive     TRIMETH/SULFA 40 SENSITIVE Sensitive     AMPICILLIN/SULBACTAM >=32 RESISTANT Resistant     PIP/TAZO <=4 SENSITIVE Sensitive     * >=100,000 COLONIES/mL ESCHERICHIA COLI  C Difficile Quick Screen (NO PCR Reflex)     Status: None   Collection Time: 08/12/20  5:47 PM   Specimen: STOOL  Result Value Ref Range Status   C Diff antigen NEGATIVE NEGATIVE Final   C Diff toxin NEGATIVE NEGATIVE Final   C Diff interpretation No C. difficile detected.  Final    Comment: Performed at [facility], [place] [address] [place], [place] [address]      Radiology Studies: No results found.    Scheduled Meds: . ARIPiprazole  2 mg Oral Daily  . atorvastatin  40 mg Oral Daily  . carvedilol  3.125 mg Oral BID WC  . dicyclomine  20 mg Oral TID AC  . FLUoxetine  40 mg Oral Daily  . furosemide  40 mg Oral Daily  . gabapentin  400 mg Oral TID  . mirtazapine  45 mg Oral QHS  . ondansetron  8 mg Oral TID AC  . pantoprazole  40 mg Oral BID  . sodium chloride flush  3 mL Intravenous Q12H   Continuous Infusions: . sodium chloride 250 mL (08/15/20 1344)  . meropenem (MERREM) IV Stopped (08/15/20 1349)  . ondansetron [facility]) IV 8 mg (08/15/20 1042)      Assessment/Plan:  1.  Partial SBO/enteritis: Patient had abdominal pain, nausea/vomiting on presentation.  Has also had diarrhea over the last couple of days.  Currently reporting left lower quadrant pain persistent diarrhea.  Stool C. difficile negative.    Will add loperamide as needed.Has Bentyl/oxycodone as needed available.  On exam she is mostly tender in left lower quadrant.?  Enteritis versus diverticulitis related. On IV meropenem for ESBL UTI. Will add Flagyl.  2.  Pyloric stenosis with history of PUD: Patient underwent EGD/dilation in July 2021.  Continue PPI-now twice daily dosing per GI.Underwent EGD 8/26 benign-appearing intrinsic moderate stenosis found at the pylorus, dilated to 15 mm.given persistent  complaints of nausea and abdominal pain, Bentyl started yesterday at 10 mg 3 times daily-increased dose by GI to 20 3 times daily this morning.  Also has oxycodone  as needed available.    3.  ESBL UTI/cystitis: Patient has some suprapubic tenderness as well.  On meropenem day 3/5.  GI feels her abdominal pain complaints could be related to UTI.  4.  Hypertension/hyperlipidemia: Resume home meds  5.  Hypokalemia: Secondary to GI losses likely.  Potassium again low at 3.1 today.  Will replace.  6.?  CKD stage I: Creatinine currently stable at 0.8 to 0.9 with GFR greater than 60  7.  Chronic respiratory failure/COPD: Resume home O2 2 L and home inhaler therapy.  8.  Chronic iron deficiency anemia: EGD in July 2021 showed pyloric stenosis but negative for H. pylori, on colonoscopy noted to have cecal polyps with tubular adenoma (without high-grade dysplasia or malignancy).  No signs of active GI bleeding.  Seen by GI for problem #1 in this admission  9.  Chronic diastolic CHF: No signs of volume overload.  Continue home meds.  10.  Obesity: BMI 32.06 kg/m, diet modifications and exercise counseling prior to discharge.   DVT prophylaxis: SCDs Code Status: Full code Family / Patient Communication: Discussed with patient Disposition Plan:   Status is: Inpatient  Remains inpatient appropriate because:IV treatments appropriate due to intensity of illness or inability to take PO   Dispo: The patient is from: Home              Anticipated d/c is to: Home              Anticipated d/c date is: 2 days              Patient currently is not medically stable to d/c.           Time spent: 35 minutes     >50% time spent in discussions with care team and coordination of care.    Guilford Shi, MD [facility] Pager in [place]  If 7PM-7AM, please contact night-coverage [email or web address] 08/15/2020, 2:41 PM

## 2020-08-15 NOTE — Progress Notes (Signed)
Physical Therapy Treatment Patient Details Name: Morgan [name] MRN: [id number] DOB: [birth year] Today's Date: 08/15/2020    History of Present Illness CHEYNE BUNGERT is a 69 y.o. female with medical history significant of COPD on 2L , CHF, CKD, chronic iron deficiency, PUD/partial pyloric stenosis presented with significant nausea, hard time drinking.  Patient also reported acute worsening of her chronic abdominal pain, decreased urine output.   Was hospitalized in June, 2021 for chronic cholecystitis, underwent cholecystectomy.  She was evaluated by GI outpatient, was scheduled for colonoscopy and EGD, findings include gastric stenosis of the pylorus, nonbleeding gastric ulcer, gastritis with hemorrhage, several colon polyps were removed.  She underwent dilation of the pyloric stenosis.    PT Comments    Pt is progressing toward goals, no dizziness today with position changes, incr amb distance (although in room only)  Follow Up Recommendations  No PT follow up     Equipment Recommendations  None recommended by PT    Recommendations for Other Services       Precautions / Restrictions Precautions Precaution Comments: no dizziness today, reviewed habituation exercises if symptoms should return Restrictions Weight Bearing Restrictions: No    Mobility  Bed Mobility Overal bed mobility: Independent                Transfers Overall transfer level: Modified independent Equipment used: None                Ambulation/Gait Ambulation/Gait assistance: Supervision;Min guard Gait Distance (Feet): 50 Feet Assistive device: IV Pole Gait Pattern/deviations: Decreased stride length;Step-through pattern     General Gait Details: in room distance (pt did not want to leave room (d/t bowel urgency). steady gait with IV pole (has cane at home if needed)   Stairs             Wheelchair Mobility    Modified Rankin (Stroke Patients Only)       Balance              Standing balance-Leahy Scale: Fair Standing balance comment: unilateral UE support for dynamic tasks, pt is able to reach ~6" out of BOS with min/guard                             Cognition Arousal/Alertness: Awake/alert Behavior During Therapy: WFL for tasks assessed/performed Overall Cognitive Status: Within Functional Limits for tasks assessed                                        Exercises Other Exercises Other Exercises: ankle pumps x 10, reviewed quad sets/glut sets, SLRs adn heel slides which pt state she is doing own her own     General Comments        Pertinent Vitals/Pain Pain Assessment: Faces Faces Pain Scale: Hurts little more Pain Location: abdomen Pain Descriptors / Indicators: Discomfort;Grimacing Pain Intervention(s): Limited activity within patient's tolerance;Monitored during session    Home Living                      Prior Function            PT Goals (current goals can now be found in the care plan section) Acute Rehab PT Goals Patient Stated Goal: To feel better PT Goal Formulation: With patient Time For Goal Achievement: 08/26/20 Potential to Achieve Goals: Good  Progress towards PT goals: Progressing toward goals    Frequency    Min 3X/week      PT Plan Current plan remains appropriate    Co-evaluation              AM-PAC PT "6 Clicks" Mobility   Outcome Measure  Help needed turning from your back to your side while in a flat bed without using bedrails?: None Help needed moving from lying on your back to sitting on the side of a flat bed without using bedrails?: None Help needed moving to and from a bed to a chair (including a wheelchair)?: None Help needed standing up from a chair using your arms (e.g., wheelchair or bedside chair)?: None Help needed to walk in hospital room?: A Little Help needed climbing 3-5 steps with a railing? : A Little 6 Click Score: 22    End of Session    Activity Tolerance: Patient tolerated treatment well Patient left: in bed;with call bell/phone within reach Nurse Communication: Mobility status (alarm not set on arrival ) PT Visit Diagnosis: Other abnormalities of gait and mobility (R26.89)     Time: 1235-1250 PT Time Calculation (min) (ACUTE ONLY): 15 min  Charges:  $Gait Training: 8-22 mins                     Baxter Flattery, PT  Acute Rehab Dept ([place]) [phone] Pager [phone]  08/15/2020    Kenyon Ana 08/15/2020, 1:28 PM

## 2020-08-16 LAB — BASIC METABOLIC PANEL
Anion gap: 8 (ref 5–15)
BUN: 7 mg/dL — ABNORMAL LOW (ref 8–23)
CO2: 37 mmol/L — ABNORMAL HIGH (ref 22–32)
Calcium: 8.3 mg/dL — ABNORMAL LOW (ref 8.9–10.3)
Chloride: 93 mmol/L — ABNORMAL LOW (ref 98–111)
Creatinine, Ser: 1.04 mg/dL — ABNORMAL HIGH (ref 0.44–1.00)
GFR calc Af Amer: >60 mL/min (ref >60)
GFR calc non Af Amer: 56 mL/min — ABNORMAL LOW (ref >60)
Glucose, Bld: 119 mg/dL — ABNORMAL HIGH (ref 70–99)
Potassium: 3.2 mmol/L — ABNORMAL LOW (ref 3.5–5.1)
Sodium: 138 mmol/L (ref 135–145)

## 2020-08-16 MED ORDER — POTASSIUM CHLORIDE 10 MEQ/100ML IV SOLN
10.0000 meq | INTRAVENOUS | Status: AC
  Administered 2020-08-16 (×2): 10 meq via INTRAVENOUS
  Filled 2020-08-16: qty 100

## 2020-08-16 NOTE — Progress Notes (Signed)
PROGRESS NOTE    Morgan [name]  PPJ:[id number]  DOB: [birth year]  PCP: [name], MD Admit date:08/09/2020 Chief compliant: Abdominal pain, nausea 69 y.o.femalewith medical history significant of COPD on 2L , CHF, CKD, chronic iron deficiency, PUD/partial pyloric stenosis presented with significant nausea, poor oral intake.  Patient also reported acute worsening of her chronic abdominal pain, decreased urine output.  Was hospitalized in June, 2021 for chronic cholecystitis, underwent cholecystectomy.  She was evaluated by GI outpatient, was scheduled for colonoscopy and EGD, findings include gastric stenosis of the pylorus, nonbleeding gastric ulcer, gastritis with hemorrhage, several colon polyps were removed.  She underwent dilation of the pyloric stenosis.  Patient reported nausea, vomiting, abdominal pain, frequent nausea without vomiting since the endoscopy.  Also had diarrhea couple days ago and a day prior to admission. CT abdomen pelvis showed few mildly dilated loops of small bowel in the midabdomen with air-fluid levels, no gastric distention.   UA positive for UTI.   Subjective:  Patient resting comfortably.  She reports persistent diarrhea -2 loose stools this morning-per patient black-nurse reports noting dark brown semiformed stools. Also has left lower quadrant abdominal pain and some suprapubic pain.   Objective: Vitals:   08/15/20 1434 08/15/20 2014 08/16/20 0607 08/16/20 1307  BP: 132/65 130/70 123/65 117/71  Pulse: 62 61 60 61  Resp: 18 18 18 19   Temp: 98 F (36.7 C) 98.1 F (36.7 C) 98.1 F (36.7 C) 97.8 F (36.6 C)  TempSrc: Oral Oral Oral Oral  SpO2: 100% 98% 97% 100%  Weight:      Height:        Intake/Output Summary (Last 24 hours) at 08/16/2020 1543 Last data filed at 08/16/2020 1400 Gross per 24 hour  Intake 2357.16 ml  Output --  Net 2357.16 ml   Filed Weights   08/09/20 0647 08/11/20 1011  Weight: 110.2 kg 110.2 kg    Physical  Examination:  General: Moderately built, no acute distress noted Head ENT: Atraumatic normocephalic, PERRLA, neck supple Heart: S1-S2 heard, regular rate and rhythm, no murmurs.  No leg edema noted Lungs: Equal air entry bilaterally, no rhonchi or rales on exam, no accessory muscle use Abdomen: Soft overall, left lower quadrant tenderness mostly, has some suprapubic/right lower quadrant tenderness as well.  No rebound or guarding, nondistended. No organomegaly.  No CVA tenderness Extremities: No pedal edema.  No cyanosis or clubbing. Neurological: Awake alert oriented x3, no focal weakness or numbness, strength and sensations to crude touch intact Skin: No wounds or rashes.   Data Reviewed: I have personally reviewed following labs and imaging studies  CBC: Recent Labs  Lab 08/10/20 0549 08/11/20 0504 08/12/20 0519 08/13/20 0618  WBC 10.3 7.9 6.3 7.1  NEUTROABS 7.6  --   --   --   HGB 10.3* 9.0* 9.1* 9.2*  HCT 34.3* 30.8* 29.8* 31.0*  MCV 82.5 83.9 81.6 83.3  PLT 299 275 171 580   Basic Metabolic Panel: Recent Labs  Lab 08/10/20 0549 08/10/20 0549 08/11/20 0504 08/11/20 0504 08/12/20 0519 08/13/20 0618 08/14/20 0531 08/15/20 0606 08/16/20 0514  NA 141   < > 140   < > 141 142 142 142 138  K 3.1*   < > 3.2*   < > 4.1 2.9* 3.3* 3.1* 3.2*  CL 105   < > 105   < > 108 105 103 99 93*  CO2 27   < > 27   < > 24 29 31  36* 37*  GLUCOSE 108*   < > 102*   < > 116* 100* 97 99 119*  BUN 23   < > 16   < > 8 6* 8 8 7*  CREATININE 0.97   < > 0.77   < > 0.78 0.81 1.11* 1.01* 1.04*  CALCIUM 8.3*   < > 7.9*   < > 8.0* 8.2* 8.1* 8.4* 8.3*  MG 2.3  2.3  --  2.2  --   --   --   --   --   --   PHOS 2.8  --   --   --   --   --   --   --   --    < > = values in this interval not displayed.   GFR: Estimated Creatinine Clearance: 74 mL/min (A) (by C-G formula based on SCr of 1.04 mg/dL (H)). Liver Function Tests: Recent Labs  Lab 08/10/20 0549  AST 54*  ALT 22  ALKPHOS 116  BILITOT  0.6  PROT 6.1*  ALBUMIN 3.2*   No results for input(s): LIPASE, AMYLASE in the last 168 hours. No results for input(s): AMMONIA in the last 168 hours. Coagulation Profile: No results for input(s): INR, PROTIME in the last 168 hours. Cardiac Enzymes: No results for input(s): CKTOTAL, CKMB, CKMBINDEX, TROPONINI in the last 168 hours. BNP (last 3 results) No results for input(s): PROBNP in the last 8760 hours. HbA1C: No results for input(s): HGBA1C in the last 72 hours. CBG: No results for input(s): GLUCAP in the last 168 hours. Lipid Profile: No results for input(s): CHOL, HDL, LDLCALC, TRIG, CHOLHDL, LDLDIRECT in the last 72 hours. Thyroid Function Tests: No results for input(s): TSH, T4TOTAL, FREET4, T3FREE, THYROIDAB in the last 72 hours. Anemia Panel: No results for input(s): VITAMINB12, FOLATE, FERRITIN, TIBC, IRON, RETICCTPCT in the last 72 hours. Sepsis Labs: Recent Labs  Lab 08/10/20 0549  LATICACIDVEN 1.0    Recent Results (from the past 240 hour(s))  SARS Coronavirus 2 by RT PCR (hospital order, performed in [facility] hospital lab) Nasopharyngeal Nasopharyngeal Swab     Status: None   Collection Time: 08/09/20  8:59 PM   Specimen: Nasopharyngeal Swab  Result Value Ref Range Status   SARS Coronavirus 2 NEGATIVE NEGATIVE Final    Comment: (NOTE) SARS-CoV-2 target nucleic acids are NOT DETECTED.  The SARS-CoV-2 RNA is generally detectable in upper and lower respiratory specimens during the acute phase of infection. The lowest concentration of SARS-CoV-2 viral copies this assay can detect is 250 copies / mL. A negative result does not preclude SARS-CoV-2 infection and should not be used as the sole basis for treatment or other patient management decisions.  A negative result may occur with improper specimen collection / handling, submission of specimen other than nasopharyngeal swab, presence of viral mutation(s) within the areas targeted by this assay, and  inadequate number of viral copies (<250 copies / mL). A negative result must be combined with clinical observations, patient history, and epidemiological information.  Fact Sheet for Patients:   StrictlyIdeas.no  Fact Sheet for Healthcare Providers: BankingDealers.co.za  This test is not yet approved or  cleared by the [place] FDA and has been authorized for detection and/or diagnosis of SARS-CoV-2 by FDA under an Emergency Use Authorization (EUA).  This EUA will remain in effect (meaning this test can be used) for the duration of the COVID-19 declaration under Section 564(b)(1) of the Act, 21 U.S.C. section 360bbb-3(b)(1), unless the authorization is terminated or revoked  sooner.  Performed at [facility], [place] [address] [place], [place] [address]   Culture, Urine     Status: Abnormal   Collection Time: 08/09/20  9:12 PM   Specimen: Urine, Clean Catch  Result Value Ref Range Status   Specimen Description   Final    URINE, CLEAN CATCH Performed at [facility], [place] [address] [place], [place] [address]    Special Requests   Final    NONE Performed at [facility], [place] [address], [place], [place] [address]    Culture (A)  Final    >=100,000 COLONIES/mL ESCHERICHIA COLI Confirmed Extended Spectrum Beta-Lactamase Producer (ESBL).  In bloodstream infections from ESBL organisms, carbapenems are preferred over piperacillin/tazobactam. They are shown to have a lower risk of mortality.    Report Status 08/13/2020 FINAL  Final   Organism ID, Bacteria ESCHERICHIA COLI (A)  Final      Susceptibility   Escherichia coli - MIC*    AMPICILLIN >=32 RESISTANT Resistant     CEFAZOLIN >=64 RESISTANT Resistant     CEFTRIAXONE >=64 RESISTANT Resistant     CIPROFLOXACIN >=4 RESISTANT Resistant     GENTAMICIN <=1 SENSITIVE Sensitive     IMIPENEM <=0.25 SENSITIVE Sensitive      NITROFURANTOIN <=16 SENSITIVE Sensitive     TRIMETH/SULFA 40 SENSITIVE Sensitive     AMPICILLIN/SULBACTAM >=32 RESISTANT Resistant     PIP/TAZO <=4 SENSITIVE Sensitive     * >=100,000 COLONIES/mL ESCHERICHIA COLI  C Difficile Quick Screen (NO PCR Reflex)     Status: None   Collection Time: 08/12/20  5:47 PM   Specimen: STOOL  Result Value Ref Range Status   C Diff antigen NEGATIVE NEGATIVE Final   C Diff toxin NEGATIVE NEGATIVE Final   C Diff interpretation No C. difficile detected.  Final    Comment: Performed at [facility], [place] [address], [place], [place] [address]      Radiology Studies: No results found.    Scheduled Meds: . ARIPiprazole  2 mg Oral Daily  . atorvastatin  40 mg Oral Daily  . carvedilol  3.125 mg Oral BID WC  . dicyclomine  20 mg Oral TID AC  . FLUoxetine  40 mg Oral Daily  . furosemide  40 mg Oral Daily  . gabapentin  400 mg Oral TID  . mirtazapine  45 mg Oral QHS  . ondansetron  8 mg Oral TID AC  . pantoprazole  40 mg Oral BID  . potassium chloride  40 mEq Oral Once  . sodium chloride flush  3 mL Intravenous Q12H   Continuous Infusions: . sodium chloride 10 mL/hr at 08/16/20 1344  . meropenem (MERREM) IV 1 g (08/16/20 1346)  . ondansetron [facility]) IV 8 mg (08/15/20 1042)      Assessment/Plan:  1.  Partial SBO/enteritis: Patient had abdominal pain, nausea/vomiting on presentation.  Has also had diarrhea over the last couple of days.  Currently reporting left lower quadrant pain persistent diarrhea.  Stool C. difficile negative.  Requested nurse to give loperamide as needed.Has Bentyl/oxycodone as needed available as well.  On exam she is mostly tender in left lower quadrant.?  Enteritis versus diverticulitis related. On IV meropenem for ESBL UTI which should cover gm -ve and anaerobes.  2.  Pyloric stenosis with history of PUD: Patient underwent EGD/dilation in July 2021.  Continue PPI-now twice daily dosing per GI.Underwent EGD  8/26 benign-appearing intrinsic moderate stenosis found at the pylorus,  dilated to 15 mm. Given persistent complaints of nausea and abdominal pain, Bentyl started at 10 mg 3 times daily-increased dose by GI to 20mg  3 times daily yesterday.  Also has oxycodone as needed available.    3.  ESBL UTI/cystitis: Patient has some suprapubic tenderness as well.  On meropenem day 4/5.  GI feels her abdominal pain complaints could be related to UTI.  4.  Hypertension/hyperlipidemia: Resume home meds  5.  Hypokalemia: Secondary to GI losses likely.  Potassium better but still low at 3.2 today.  Will replace.  6.?  CKD stage I: Creatinine currently stable at 0.8 to 0.9 with GFR greater than 60  7.  Chronic respiratory failure/COPD: Resume home O2 2 L and home inhaler therapy.  8.  Chronic iron deficiency anemia: EGD in July 2021 showed pyloric stenosis but negative for H. pylori, on colonoscopy noted to have cecal polyps with tubular adenoma (without high-grade dysplasia or malignancy).  No signs of active GI bleeding.  Seen by GI for problem #1 in this admission  9.  Chronic diastolic CHF: No signs of volume overload.  Continue home meds.  10.  Obesity: BMI 32.06 kg/m, diet modifications and exercise counseling prior to discharge.   DVT prophylaxis: SCDs Code Status: Full code Family / Patient Communication: Discussed with patient Disposition Plan:   Status is: Inpatient  Remains inpatient appropriate because:IV treatments appropriate due to intensity of illness or inability to take PO   Dispo: The patient is from: Home              Anticipated d/c is to: Home              Anticipated d/c date is: 1 day if cleared by GI and finishes meropenem, pain controlled              Patient currently is not medically stable to d/c.           Time spent: 35 minutes     >50% time spent in discussions with care team and coordination of care.    Guilford Shi, MD [facility] Pager in  [place]  If 7PM-7AM, please contact night-coverage [email or web address] 08/16/2020, 3:43 PM

## 2020-08-17 ENCOUNTER — Encounter (HOSPITAL_COMMUNITY): Payer: Self-pay | Admitting: Internal Medicine

## 2020-08-17 DIAGNOSIS — R197 Diarrhea, unspecified: Secondary | ICD-10-CM

## 2020-08-17 LAB — BASIC METABOLIC PANEL
Anion gap: 10 (ref 5–15)
BUN: 9 mg/dL (ref 8–23)
CO2: 39 mmol/L — ABNORMAL HIGH (ref 22–32)
Calcium: 8.6 mg/dL — ABNORMAL LOW (ref 8.9–10.3)
Chloride: 92 mmol/L — ABNORMAL LOW (ref 98–111)
Creatinine, Ser: 0.95 mg/dL (ref 0.44–1.00)
GFR calc Af Amer: >60 mL/min (ref >60)
GFR calc non Af Amer: >60 mL/min (ref >60)
Glucose, Bld: 97 mg/dL (ref 70–99)
Potassium: 3.1 mmol/L — ABNORMAL LOW (ref 3.5–5.1)
Sodium: 141 mmol/L (ref 135–145)

## 2020-08-17 MED ORDER — POTASSIUM CHLORIDE CRYS ER 20 MEQ PO TBCR
40.0000 meq | EXTENDED_RELEASE_TABLET | ORAL | Status: DC
  Administered 2020-08-17: 08:00:00 40 meq via ORAL
  Filled 2020-08-17: qty 2

## 2020-08-17 NOTE — Progress Notes (Signed)
PROGRESS NOTE  Morgan [name] YFV:[id number] DOB: [birth year]   PCP: [name], MD  Patient is from: Home  DOA: 08/09/2020 LOS: 8  Brief Narrative / Interim history: 69 y.o.femalewith medical history significant of COPD on 2L , diastolic CHF, chronic iron deficiency, PUD/partial pyloric stenosis presented with significant nausea, poor oral intake. Patient also reported acute worsening of her chronic abdominal pain, decreased urine output.Was hospitalized in June, 2021 for chronic cholecystitis, underwent cholecystectomy. She was evaluated by GI outpatient, was scheduled for colonoscopy and EGD, findings include gastric stenosis of the pylorus, nonbleeding gastric ulcer, gastritis with hemorrhage, several colon polyps were removed. She underwent dilation of the pyloric stenosis. Patient reported nausea, vomiting, abdominal pain, frequent nausea without vomiting since the endoscopy. Also had diarrhea couple days ago and a day prior to admission.  CT abdomen pelvis showed few mildly dilated loops of small bowel in the midabdomen with air-fluid levels, no gastric distention. UA positive for UTI.  Admitted and being treated for partial SBO/enteritis and ESBL UTI. She is on IV meropenem.  GI involved.  Subjective: Seen and examined earlier this morning.  She had an episode of emesis and watery diarrhea last night.  Also reports abdominal pain mainly on the left.  She describes the pain as pressure with breakthrough sharp pain.  No radiation to the back.  She reports hoarse voice since she had an EGD and dilation.  She denies UTI symptoms.  Objective: Vitals:   08/16/20 1307 08/16/20 2017 08/17/20 0521 08/17/20 1331  BP: 117/71 124/71 (!) 146/79 117/64  Pulse: 61 64 (!) 55 62  Resp: 19 18 18 18   Temp: 97.8 F (36.6 C) 98.2 F (36.8 C) 98.3 F (36.8 C) 98.4 F (36.9 C)  TempSrc: Oral Oral Oral Oral  SpO2: 100% 97% 97% 92%  Weight:      Height:        Intake/Output Summary  (Last 24 hours) at 08/17/2020 1344 Last data filed at 08/17/2020 1334 Gross per 24 hour  Intake 3464.48 ml  Output 0 ml  Net 3464.48 ml   Filed Weights   08/09/20 0647 08/11/20 1011  Weight: 110.2 kg 110.2 kg    Examination:  GENERAL: No apparent distress.  Nontoxic. HEENT: MMM.  Vision and hearing grossly intact.  NECK: Supple.  No apparent JVD.  RESP:  No IWOB.  Fair aeration bilaterally. CVS:  RRR. Heart sounds normal.  ABD/GI/GU: BS+. Abd soft.  Tenderness and rebound over left abdomen mainly LLQ.  MSK/EXT:  Moves extremities. No apparent deformity. No edema.  SKIN: no apparent skin lesion or wound NEURO: Awake, alert and oriented appropriately.  No apparent focal neuro deficit. PSYCH: Calm. Normal affect.  Procedures:  None  Microbiology summarized: COVID-19 PCR negative. C. difficile negative. Urine culture with ESBL E. coli  Assessment & Plan: Intractable nausea/vomiting/diarrhea-due to enteritis/partial SBO?  IBS?  Iatrogenic?  C. difficile negative.  CT abdomen and pelvis on admission concerning for partial SBO.  -Symptomatic care with antiemetics, loperamide and Bentyl -Will follow GI recommendations.  Partial SBO/enteritis: on IV meropenem for ESBL UTI which should cover gm -ve and anaerobes. -Continue symptomatic care -Continue soft diet. -If no improvement, will obtain repeat CT abdomen and pelvis.  Pyloric stenosis with history of PUD: Had EGD/dilation in July 2021. EGD 8/26 benign-appearing intrinsic moderate stenosis found at the pylorus, dilated to 15 mm.  -Continue PPI-now twice daily dosing per GI. Underwent   -Bentyl-increased dose by GI to 20mg  3 times daily on  8/30  ESBL UTI/cystitis?  She denies UTI symptoms but GI symptoms as above. -Completing IV meropenem for 5 days today  Hypertension: Normotensive  -Continue home medications  Hypokalemia: K3.1. -K-Dur 80 mEq x 1 -Check magnesium.  Hyperlipidemia: -Continue statin.  Chronic  COPD/chronic RF on 2 L -Continue home medications  Chronic iron deficiency anemia: H&H stable. -Continue monitoring  Chronic diastolic CHF: Appears euvolemic. -Continue home medications  Mood disorder: Stable. -Continue home Abilify, Prozac, Remeron and gabapentin  Class I obesity: BMI 32.06 kg/m -Encourage lifestyle change to lose weight.   Body mass index is 32.06 kg/m.         DVT prophylaxis:  SCDs Start: 08/09/20 2342  Code Status: Full code Family Communication: Patient and/or RN. Available if any question. Status is: Inpatient  Remains inpatient appropriate because:IV treatments appropriate due to intensity of illness or inability to take PO, Inpatient level of care appropriate due to severity of illness and Ongoing nausea, vomiting and diarrhea On IV antibiotics for ESBL UTI and possible enteritis.  Dispo: The patient is from: Home              Anticipated d/c is to: Home              Anticipated d/c date is: 1 day              Patient currently is not medically stable to d/c.       Consultants:  Gastroenterology   Sch Meds:  Scheduled Meds: . ARIPiprazole  2 mg Oral Daily  . atorvastatin  40 mg Oral Daily  . carvedilol  3.125 mg Oral BID WC  . dicyclomine  20 mg Oral TID AC  . FLUoxetine  40 mg Oral Daily  . gabapentin  400 mg Oral TID  . mirtazapine  45 mg Oral QHS  . ondansetron  8 mg Oral TID AC  . pantoprazole  40 mg Oral BID  . sodium chloride flush  3 mL Intravenous Q12H   Continuous Infusions: . sodium chloride 10 mL/hr at 08/16/20 1344  . meropenem (MERREM) IV Stopped (08/17/20 0617)  . ondansetron (ZOFRAN) IV 8 mg (08/15/20 1042)   PRN Meds:.sodium chloride, acetaminophen, albuterol, alum & mag hydroxide-simeth, hydrALAZINE, loperamide, ondansetron (ZOFRAN) IV, oxyCODONE-acetaminophen  Antimicrobials: Anti-infectives (From admission, onward)   Start     Dose/Rate Route Frequency Ordered Stop   08/15/20 1700  metroNIDAZOLE  (FLAGYL) IVPB 500 mg  Status:  Discontinued        500 mg 100 mL/hr over 60 Minutes Intravenous Every 8 hours 08/15/20 1652 08/15/20 1702   08/13/20 1430  meropenem (MERREM) 1 g in sodium chloride 0.9 % 100 mL IVPB        1 g 200 mL/hr over 30 Minutes Intravenous Every 8 hours 08/13/20 1406 08/18/20 1359   08/10/20 1000  cefTRIAXone (ROCEPHIN) 2 g in sodium chloride 0.9 % 100 mL IVPB  Status:  Discontinued        2 g 200 mL/hr over 30 Minutes Intravenous Every 24 hours 08/09/20 2226 08/13/20 1357   08/09/20 2030  cefTRIAXone (ROCEPHIN) 1 g in sodium chloride 0.9 % 100 mL IVPB        1 g 200 mL/hr over 30 Minutes Intravenous  Once 08/09/20 2016 08/09/20 2329       I have personally reviewed the following labs and images: CBC: Recent Labs  Lab 08/11/20 0504 08/12/20 0519 08/13/20 0618  WBC 7.9 6.3 7.1  HGB 9.0* 9.1* 9.2*  HCT 30.8* 29.8* 31.0*  MCV 83.9 81.6 83.3  PLT 275 171 268   BMP &GFR Recent Labs  Lab 08/11/20 0504 08/12/20 0519 08/13/20 0618 08/14/20 0531 08/15/20 0606 08/16/20 0514 08/17/20 0518  NA 140   < > 142 142 142 138 141  K 3.2*   < > 2.9* 3.3* 3.1* 3.2* 3.1*  CL 105   < > 105 103 99 93* 92*  CO2 27   < > 29 31 36* 37* 39*  GLUCOSE 102*   < > 100* 97 99 119* 97  BUN 16   < > 6* 8 8 7* 9  CREATININE 0.77   < > 0.81 1.11* 1.01* 1.04* 0.95  CALCIUM 7.9*   < > 8.2* 8.1* 8.4* 8.3* 8.6*  MG 2.2  --   --   --   --   --   --    < > = values in this interval not displayed.   Estimated Creatinine Clearance: 81 mL/min (by C-G formula based on SCr of 0.95 mg/dL). Liver & Pancreas: No results for input(s): AST, ALT, ALKPHOS, BILITOT, PROT, ALBUMIN in the last 168 hours. No results for input(s): LIPASE, AMYLASE in the last 168 hours. No results for input(s): AMMONIA in the last 168 hours. Diabetic: No results for input(s): HGBA1C in the last 72 hours. No results for input(s): GLUCAP in the last 168 hours. Cardiac Enzymes: No results for input(s): CKTOTAL,  CKMB, CKMBINDEX, TROPONINI in the last 168 hours. No results for input(s): PROBNP in the last 8760 hours. Coagulation Profile: No results for input(s): INR, PROTIME in the last 168 hours. Thyroid Function Tests: No results for input(s): TSH, T4TOTAL, FREET4, T3FREE, THYROIDAB in the last 72 hours. Lipid Profile: No results for input(s): CHOL, HDL, LDLCALC, TRIG, CHOLHDL, LDLDIRECT in the last 72 hours. Anemia Panel: No results for input(s): VITAMINB12, FOLATE, FERRITIN, TIBC, IRON, RETICCTPCT in the last 72 hours. Urine analysis:    Component Value Date/Time   COLORURINE AMBER (A) 08/09/2020 1842   APPEARANCEUR CLOUDY (A) 08/09/2020 1842   APPEARANCEUR Cloudy 09/18/2012 2000   LABSPEC 1.028 08/09/2020 1842   LABSPEC 1.012 09/18/2012 2000   PHURINE 5.0 08/09/2020 1842   GLUCOSEU NEGATIVE 08/09/2020 1842   GLUCOSEU Negative 09/18/2012 2000   HGBUR MODERATE (A) 08/09/2020 1842   BILIRUBINUR NEGATIVE 08/09/2020 1842   BILIRUBINUR negative 04/08/2019 1103   BILIRUBINUR Negative 09/18/2012 2000   KETONESUR 5 (A) 08/09/2020 1842   PROTEINUR 30 (A) 08/09/2020 1842   UROBILINOGEN 0.2 04/08/2019 1103   UROBILINOGEN 0.2 05/25/2017 1743   NITRITE POSITIVE (A) 08/09/2020 1842   LEUKOCYTESUR LARGE (A) 08/09/2020 1842   LEUKOCYTESUR Trace 09/18/2012 2000   Sepsis Labs: Invalid input(s): PROCALCITONIN, [place]  Microbiology: Recent Results (from the past 240 hour(s))  SARS Coronavirus 2 by RT PCR (hospital order, performed in [facility] hospital lab) Nasopharyngeal Nasopharyngeal Swab     Status: None   Collection Time: 08/09/20  8:59 PM   Specimen: Nasopharyngeal Swab  Result Value Ref Range Status   SARS Coronavirus 2 NEGATIVE NEGATIVE Final    Comment: (NOTE) SARS-CoV-2 target nucleic acids are NOT DETECTED.  The SARS-CoV-2 RNA is generally detectable in upper and lower respiratory specimens during the acute phase of infection. The lowest concentration of SARS-CoV-2 viral  copies this assay can detect is 250 copies / mL. A negative result does not preclude SARS-CoV-2 infection and should not be used as the sole basis for treatment or other patient management decisions.  A negative result may occur with improper specimen collection / handling, submission of specimen other than nasopharyngeal swab, presence of viral mutation(s) within the areas targeted by this assay, and inadequate number of viral copies (<250 copies / mL). A negative result must be combined with clinical observations, patient history, and epidemiological information.  Fact Sheet for Patients:   StrictlyIdeas.no  Fact Sheet for Healthcare Providers: BankingDealers.co.za  This test is not yet approved or  cleared by the [place] FDA and has been authorized for detection and/or diagnosis of SARS-CoV-2 by FDA under an Emergency Use Authorization (EUA).  This EUA will remain in effect (meaning this test can be used) for the duration of the COVID-19 declaration under Section 564(b)(1) of the Act, 21 U.S.C. section 360bbb-3(b)(1), unless the authorization is terminated or revoked sooner.  Performed at [facility], [place] [address], [place], [place] [address]   Culture, Urine     Status: Abnormal   Collection Time: 08/09/20  9:12 PM   Specimen: Urine, Clean Catch  Result Value Ref Range Status   Specimen Description   Final    URINE, CLEAN CATCH Performed at [facility], [place] [address], [place], [place] [address]    Special Requests   Final    NONE Performed at [facility], [place] [address] [place], [place] [address]    Culture (A)  Final    >=100,000 COLONIES/mL ESCHERICHIA COLI Confirmed Extended Spectrum Beta-Lactamase Producer (ESBL).  In bloodstream infections from ESBL organisms, carbapenems are preferred over piperacillin/tazobactam. They are shown to have a lower risk of  mortality.    Report Status 08/13/2020 FINAL  Final   Organism ID, Bacteria ESCHERICHIA COLI (A)  Final      Susceptibility   Escherichia coli - MIC*    AMPICILLIN >=32 RESISTANT Resistant     CEFAZOLIN >=64 RESISTANT Resistant     CEFTRIAXONE >=64 RESISTANT Resistant     CIPROFLOXACIN >=4 RESISTANT Resistant     GENTAMICIN <=1 SENSITIVE Sensitive     IMIPENEM <=0.25 SENSITIVE Sensitive     NITROFURANTOIN <=16 SENSITIVE Sensitive     TRIMETH/SULFA 40 SENSITIVE Sensitive     AMPICILLIN/SULBACTAM >=32 RESISTANT Resistant     PIP/TAZO <=4 SENSITIVE Sensitive     * >=100,000 COLONIES/mL ESCHERICHIA COLI  C Difficile Quick Screen (NO PCR Reflex)     Status: None   Collection Time: 08/12/20  5:47 PM   Specimen: STOOL  Result Value Ref Range Status   C Diff antigen NEGATIVE NEGATIVE Final   C Diff toxin NEGATIVE NEGATIVE Final   C Diff interpretation No C. difficile detected.  Final    Comment: Performed at [facility], [place] [address] [place], [place] [address]    Radiology Studies: No results found.    Syrita Dovel T. [place]  If 7PM-7AM, please contact night-coverage [email or web address] 08/17/2020, 1:44 PM

## 2020-08-17 NOTE — Progress Notes (Signed)
Physical Therapy Treatment Patient Details Name: Morgan [name] MRN: [id number] DOB: 1951/02/03 Today's Date: 08/17/2020    History of Present Illness [name] is a 69 y.o. female with medical history significant of COPD on 2L , CHF, CKD, chronic iron deficiency, PUD/partial pyloric stenosis presented with significant nausea, hard time drinking.  Patient also reported acute worsening of her chronic abdominal pain, decreased urine output.   Was hospitalized in June, 2021 for chronic cholecystitis, underwent cholecystectomy.  She was evaluated by GI outpatient, was scheduled for colonoscopy and EGD, findings include gastric stenosis of the pylorus, nonbleeding gastric ulcer, gastritis with hemorrhage, several colon polyps were removed.  She underwent dilation of the pyloric stenosis.    PT Comments    Assisted OOB to bathroom then amb in hallway.  General Gait Details: amb in hallway an increased distance with SPC.  Amb on RA sats avg 96% and HR 88. Tolerated distance well.  Min/trace dyspnea.  No c/o dizziness this session.    Follow Up Recommendations  No PT follow up     Equipment Recommendations  None recommended by PT    Recommendations for Other Services       Precautions / Restrictions Precautions Precautions: Fall Precaution Comments: monitor sats Restrictions Weight Bearing Restrictions: No    Mobility  Bed Mobility Overal bed mobility: Modified Independent             General bed mobility comments: increased time  Transfers Overall transfer level: Modified independent Equipment used: None Transfers: Sit to/from [facility] Sit to Stand: Modified independent (Device/Increase time)         General transfer comment: good safety cognition and use of hands to steady self.  Also self able with toileting and hygiene.  Ambulation/Gait Ambulation/Gait assistance: Supervision Gait Distance (Feet): 145 Feet Assistive device: Straight cane Gait  Pattern/deviations: WFL(Within Functional Limits) Gait velocity: decreased   General Gait Details: amb in hallway an increased distance with SPC.  Amb on RA sats avg 96% and HR 88. Tolerated distance well.  Min/trace dyspnea.   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        Pertinent Vitals/Pain Pain Assessment: No/denies pain    Home Living                      Prior Function            PT Goals (current goals can now be found in the care plan section) Progress towards PT goals: Progressing toward goals    Frequency    Min 3X/week      PT Plan Current plan remains appropriate    Co-evaluation  AM-PAC PT "6 Clicks" Mobility   Outcome Measure  Help needed turning from your back to your side while in a flat bed without using bedrails?: None Help needed moving from lying on your back to sitting on the side of a flat bed without using bedrails?: None Help needed moving to and from a bed to a chair (including a wheelchair)?: None Help needed standing up from a chair using your arms (e.g., wheelchair or bedside chair)?: None Help needed to walk in hospital room?: None Help needed climbing 3-5 steps with a railing? : A Little 6 Click Score: 23    End of Session Equipment Utilized During Treatment: Gait belt Activity Tolerance: Patient tolerated treatment well Patient left: in chair;with call bell/phone within reach Nurse Communication: Mobility status PT Visit Diagnosis: Other abnormalities of gait  and mobility (R26.89)     Time: 6122-4497 PT Time Calculation (min) (ACUTE ONLY): 18 min  Charges:  $Gait Training: 8-22 mins                     Rica Koyanagi  PTA Acute  Rehabilitation Services Pager      [phone] Office      [phone]

## 2020-08-17 NOTE — Progress Notes (Signed)
Patient had c/o itching and redness @ iv site. This was second dose of antibiotic MERREM given during this shift, patient mentioned the first dose given during day shift caused a little redness around IV as well. New IV was used during this shift and itching and some redness occurred during dose, antibiotic was stopped with only about 15 ml left due to patient itching complaint. Lavonna Monarch

## 2020-08-18 ENCOUNTER — Inpatient Hospital Stay (HOSPITAL_COMMUNITY): Payer: Medicare HMO

## 2020-08-18 ENCOUNTER — Telehealth: Payer: Self-pay | Admitting: *Deleted

## 2020-08-18 LAB — CBC
HCT: 37 % (ref 36.0–46.0)
Hemoglobin: 11.2 g/dL — ABNORMAL LOW (ref 12.0–15.0)
MCH: 24.9 pg — ABNORMAL LOW (ref 26.0–34.0)
MCHC: 30.3 g/dL (ref 30.0–36.0)
MCV: 82.4 fL (ref 80.0–100.0)
Platelets: 269 10*3/uL (ref 150–400)
RBC: 4.49 MIL/uL (ref 3.87–5.11)
RDW: 15.1 % (ref 11.5–15.5)
WBC: 6.8 10*3/uL (ref 4.0–10.5)
nRBC: 0 % (ref 0.0–0.2)

## 2020-08-18 LAB — RENAL FUNCTION PANEL
Albumin: 3 g/dL — ABNORMAL LOW (ref 3.5–5.0)
Anion gap: 12 (ref 5–15)
BUN: 12 mg/dL (ref 8–23)
CO2: 35 mmol/L — ABNORMAL HIGH (ref 22–32)
Calcium: 8.9 mg/dL (ref 8.9–10.3)
Chloride: 96 mmol/L — ABNORMAL LOW (ref 98–111)
Creatinine, Ser: 0.95 mg/dL (ref 0.44–1.00)
GFR calc Af Amer: >60 mL/min (ref >60)
GFR calc non Af Amer: >60 mL/min (ref >60)
Glucose, Bld: 98 mg/dL (ref 70–99)
Phosphorus: 3.1 mg/dL (ref 2.5–4.6)
Potassium: 3.3 mmol/L — ABNORMAL LOW (ref 3.5–5.1)
Sodium: 143 mmol/L (ref 135–145)

## 2020-08-18 LAB — MAGNESIUM: Magnesium: 1.9 mg/dL (ref 1.7–2.4)

## 2020-08-18 MED ORDER — METAMUCIL SMOOTH TEXTURE 58.6 % PO POWD: 1.0000 | Freq: Three times a day (TID) | ORAL | 12 refills | Status: DC

## 2020-08-18 MED ORDER — IOHEXOL 9 MG/ML PO SOLN
ORAL | Status: AC
  Filled 2020-08-18: qty 1000

## 2020-08-18 MED ORDER — POTASSIUM CHLORIDE ER 20 MEQ PO TBCR: 10.0000 meq | EXTENDED_RELEASE_TABLET | Freq: Every day | ORAL | 1 refills | Status: DC

## 2020-08-18 MED ORDER — PROBIOTIC ACIDOPHILUS PO CAPS: 1.0000 | ORAL_CAPSULE | Freq: Two times a day (BID) | ORAL | 0 refills | Status: DC

## 2020-08-18 MED ORDER — POTASSIUM CHLORIDE CRYS ER 20 MEQ PO TBCR
40.0000 meq | EXTENDED_RELEASE_TABLET | ORAL | Status: AC
  Administered 2020-08-18 (×2): 40 meq via ORAL
  Filled 2020-08-18 (×2): qty 2

## 2020-08-18 MED ORDER — IOHEXOL 300 MG/ML  SOLN
100.0000 mL | Freq: Once | INTRAMUSCULAR | Status: AC | PRN
  Administered 2020-08-18: 13:00:00 100 mL via INTRAVENOUS

## 2020-08-18 MED ORDER — OXYCODONE HCL 5 MG PO TABS: 5.0000 mg | ORAL_TABLET | Freq: Three times a day (TID) | ORAL | 0 refills | Status: AC | PRN

## 2020-08-18 MED ORDER — ONDANSETRON 4 MG PO TBDP: 4.0000 mg | ORAL_TABLET | Freq: Three times a day (TID) | ORAL | 0 refills | Status: DC | PRN

## 2020-08-18 MED ORDER — FUROSEMIDE 40 MG PO TABS: 40.0000 mg | ORAL_TABLET | Freq: Every day | ORAL | Status: DC | PRN

## 2020-08-18 MED ORDER — IOHEXOL 9 MG/ML PO SOLN
500.0000 mL | ORAL | Status: AC
  Administered 2020-08-18 (×2): 500 mL via ORAL

## 2020-08-18 NOTE — Telephone Encounter (Signed)
Pt left VM at Triage, she is being d/c from hospital. She asked for an Rx for pain med and med for nausea and she said on the VM that the hospital doctors siad that they can't write an Rx and she would have to call her PCP.   Pt request call back [phone]

## 2020-08-18 NOTE — Discharge Summary (Addendum)
Physician Discharge Summary  [name] OJJ:[id number] DOB: [birth year] DOA: 08/09/2020  PCP: Morgan Noe, MD  Admit date: 08/09/2020 Discharge date: 08/18/2020  Admitted From: Home Disposition: Home  Recommendations for Outpatient Follow-up:  1. Follow ups as below. 2. Please obtain CBC/BMP/Mag at follow up 3. Please follow up on the following pending results: None  Home Health: None required Equipment/Devices: None required  Discharge Condition: Stable CODE STATUS: Full code   Follow-up Information    Morgan Noe, MD. Schedule an appointment as soon as possible for a visit in 1 week(s).   Specialty: Family Medicine Contact information: [place] [address] [phone]               Hospital Course: 69 y.o.femalewith medical history significant of COPD on 2L , diastolic CHF, chronic iron deficiency, PUD/partial pyloric stenosis presented with significant nausea, poor oral intake. Patient also reported acute worsening of her chronic abdominal pain, decreased urine output.Was hospitalized in June, 2021 for chronic cholecystitis, underwent cholecystectomy. She was evaluated by GI outpatient, was scheduled for colonoscopy and EGD, findings include gastric stenosis of the pylorus, nonbleeding gastric ulcer, gastritis with hemorrhage, several colon polyps were removed. She underwent dilation of the pyloric stenosis. Patient reported nausea, vomiting and abdominal pain since the endoscopy. Also had diarrhea couple days ago and a day prior to admission.  On admission, CT abdomen pelvis showed few mildly dilated loops of small bowel in the midabdomen with air-fluid levels, no gastric distention. UA positive for UTI.  Admitted and being treated for partial SBO/enteritis and ESBL UTI.  Started on IV meropenem.  GI consulted and recommended a trial of opiate, and Imodium.  Eventually, GI symptoms improved.  Repeat CT abdomen and pelvis on the day  of discharge without significant finding.  She completed IV meropenem for 5 days for ESBL UTI and possible enteritis.  She was discharged to follow-up with PCP and gastroenterology.  Recommended Metamucil and probiotics.   See individual problem list below for more hospital course.  Discharge Diagnoses:  Intractable nausea/vomiting/diarrhea/abdominal pain-suspecting IBS or Iatrogenic. C. difficile negative.  CT abdomen and pelvis on admission concerning for partial SBO.  Repeat CT abdomen and pelvis without significant finding.  Overall, patient symptoms improved.  -Outpatient follow-up with PCP and GI. -Continue home Zofran.  Added Metamucil and probiotics.  Addendum: -Added oxycodone 5 mg every 8 hours as needed, #21 per recommendation by GI, Dr. Einar Pheasant.  Partial SBO/enteritis: on IV meropenem for ESBL UTI which should cover gm -ve and anaerobes.  Repeat CT abdomen and pelvis without significant finding.  Pyloric stenosis with history of PUD: Had EGD/dilation in July 2021. EGD 8/26 benign-appearing intrinsic moderate stenosis found at the pylorus, dilated to 15 mm. -Continue PPI and Carafate  ESBL UTI/cystitis?  She denies UTI symptoms but GI symptoms as above. -Completed IV meropenem for 5 days  Hypertension: Normotensive  -Continue home medications  Hypokalemia: K3.3.  Replenished prior to discharge. -Changed Lasix to as needed.  Patient to start in 5 days. -Discharged on K-Dur 20 mEq daily.  Hyperlipidemia: -Continue statin.  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 -Continue monitoring  Chronic diastolic CHF: Appears euvolemic. -Patient to start Lasix in 5 days as needed  Mood disorder: Stable. -Continue home Abilify, Prozac, Remeron and gabapentin  Class I obesity:BMI 32.06 kg/m -Encourage lifestyle change to lose weight.   Body mass index is 32.06 kg/m.  Discharge  Exam: Vitals:   08/18/20 0553 08/18/20 1423  BP: 131/67 134/70  Pulse: 61 65  Resp: 15 14  Temp: 97.6 F (36.4 C) 98.2 F (36.8 C)  SpO2: 100% 100%    GENERAL: No apparent distress.  Nontoxic. HEENT: MMM.  Vision and hearing grossly intact.  NECK: Supple.  No apparent JVD.  RESP: On 2 L.  No IWOB.  Fair aeration bilaterally. CVS:  RRR. Heart sounds normal.  ABD/GI/GU: Bowel sounds present. Soft.  Diffuse tenderness.  No rebound. MSK/EXT:  Moves extremities. No apparent deformity. No edema.  SKIN: no apparent skin lesion or wound NEURO: Awake, alert and oriented appropriately.  No apparent focal neuro deficit. PSYCH: Calm. Normal affect.  Discharge Instructions  Discharge Instructions    Call MD for:  extreme fatigue   Complete by: As directed    Call MD for:  persistant dizziness or light-headedness   Complete by: As directed    Call MD for:  persistant nausea and vomiting   Complete by: As directed    Call MD for:  severe uncontrolled pain   Complete by: As directed    Call MD for:  temperature >100.4   Complete by: As directed    Diet - low sodium heart healthy   Complete by: As directed    Discharge instructions   Complete by: As directed    It has been a pleasure taking care of you!  You were hospitalized altered mental status, nausea, vomiting, abdominal pain, diarrhea and UTI.  There was some concern about partial bowel obstruction of your bowel on initial CT scan of the abdomen that seems to have resolved on repeat CT. We have treated you with IV antibiotic for urinary tract infection and possible bowel infection.  We are discharging you to follow-up with your primary care doctor and gastroenterology.   We may have started you on other new medications or made some changes to your home medications during this hospitalization. Please review your new medication list and the directions carefully before you take them.    Please go to your hospital follow-up  appointments or call to schedule as recommended.   Take care,   Increase activity slowly   Complete by: As directed      Allergies as of 08/18/2020      Reactions   Meperidine Hives, Anaphylaxis   Strawberry Extract Swelling   Strawberry (diagnostic) Hives   Wasp Venom Hives   Wasp Venom Protein       Medication List    STOP taking these medications   colestipol 1 g tablet Commonly known as: COLESTID   esomeprazole 40 MG capsule Commonly known as: NEXIUM     TAKE these medications   albuterol 108 (90 Base) MCG/ACT inhaler Commonly known as: VENTOLIN HFA TAKE 2 PUFFS BY MOUTH EVERY 6 HOURS AS NEEDED FOR WHEEZE OR SHORTNESS OF BREATH   ARIPiprazole 2 MG tablet Commonly known as: ABILIFY Take 2 mg by mouth daily.   aspirin 81 MG EC tablet Take 81 mg by mouth daily. daily   aspirin-acetaminophen-caffeine 250-250-65 MG tablet Commonly known as: EXCEDRIN MIGRAINE Take 2 tablets by mouth every 6 (six) hours as needed for headache.   atorvastatin 40 MG tablet Commonly known as: LIPITOR Take 40 mg by mouth daily.   carvedilol 3.125 MG tablet Commonly known as: COREG Take 3.125 mg by mouth in the morning and at bedtime.   Dexilant 60 MG capsule Generic drug: dexlansoprazole TAKE 1 CAPSULE BY  MOUTH EVERY DAY What changed: how much to take   FLUoxetine 40 MG capsule Commonly known as: PROZAC Take 40 mg by mouth daily.   furosemide 40 MG tablet Commonly known as: LASIX Take 1 tablet (40 mg total) by mouth daily as needed for fluid or edema. Start taking on: August 22, 2020 What changed:   how much to take  how to take this  when to take this  reasons to take this  additional instructions  These instructions start on August 22, 2020. If you are unsure what to do until then, ask your doctor or other care provider.   gabapentin 400 MG capsule Commonly known as: NEURONTIN Take 400 mg by mouth 3 (three) times daily.   hydrOXYzine 25 MG tablet Commonly  known as: ATARAX/VISTARIL Take 12.5-25 mg by mouth as needed for anxiety.   Iron 325 (65 Fe) MG Tabs Take 1 tablet (325 mg total) by mouth daily.   linaclotide 145 MCG Caps capsule Commonly known as: Linzess Take 1 capsule (145 mcg total) by mouth daily before breakfast. What changed:   when to take this  reasons to take this   Metamucil Smooth Texture 58.6 % powder Generic drug: psyllium Take 1 packet by mouth 3 (three) times daily.   mirtazapine 45 MG tablet Commonly known as: REMERON Take 45 mg by mouth at bedtime.   ondansetron 4 MG disintegrating tablet Commonly known as: ZOFRAN-ODT Take 1 tablet (4 mg total) by mouth every 8 (eight) hours as needed for nausea or vomiting.   OXYGEN Inhale into the lungs. 02 at bedtime   Potassium Chloride ER 20 MEQ Tbcr Take 10 mEq by mouth daily.   Probiotic Acidophilus Caps Take 1 tablet by mouth in the morning and at bedtime.   sucralfate 1 g tablet Commonly known as: CARAFATE TAKE 1 TABLET (1 G TOTAL) BY MOUTH 4 (FOUR) TIMES DAILY - WITH MEALS AND AT BEDTIME.       Consultations:  Gastroenterology  Procedures/Studies:   DG Abd 1 View  Result Date: 08/10/2020 CLINICAL DATA:  Small-bowel obstruction, not feeling better EXAM: ABDOMEN - 1 VIEW COMPARISON:  Portable exam 0838 hours compared to 09/03/2017 FINDINGS: Excreted contrast material within renal collecting systems and bladder. Decreased small bowel distension since prior study. Increased colonic gas. No bowel wall thickening. Surgical clips RIGHT upper quadrant likely reflect cholecystectomy. Bones demineralized. IMPRESSION: Decreased small bowel distension since prior study Electronically Signed   By: Lavonia Dana M.D.   On: 08/10/2020 08:54   CT ABDOMEN PELVIS W CONTRAST  Result Date: 08/18/2020 CLINICAL DATA:  Left lower quadrant abdominal pain. EXAM: CT ABDOMEN AND PELVIS WITH CONTRAST TECHNIQUE: Multidetector CT imaging of the abdomen and pelvis was performed  using the standard protocol following bolus administration of intravenous contrast. CONTRAST:  122mL OMNIPAQUE IOHEXOL 300 MG/ML  SOLN COMPARISON:  Abdominal radiographs, 08/10/2020.  CT, 08/09/2020. FINDINGS: Lower chest: No acute abnormality. Hepatobiliary: Liver normal in size and overall attenuation. No mass or focal lesion. Status post cholecystectomy. Mild intra and extrahepatic bile duct dilation with normal distal tapering common bile duct. This is stable from the prior CT. Pancreas: Unremarkable. No pancreatic ductal dilatation or surrounding inflammatory changes. Spleen: Normal in size without focal abnormality. Adrenals/Urinary Tract: No adrenal masses. Kidneys are normal in orientation and position. There is symmetric renal enhancement and excretion. Bilateral renal cortical thinning. No mass, stone or hydronephrosis. Normal ureters. Normal bladder. Stomach/Bowel: Normal stomach and small bowel. Colon is normal in caliber. Mild generalized  increase colonic stool burden. Diverticula noted along the sigmoid colon. No diverticulitis, colonic wall thickening or other inflammatory process. Vascular/Lymphatic: Aortic atherosclerosis. No enlarged abdominal or pelvic lymph nodes. Reproductive: Status post hysterectomy. No adnexal masses. Other: No abdominal wall hernia or abnormality. No abdominopelvic ascites. Musculoskeletal: No fracture or acute finding. No osteoblastic or osteolytic lesions. IMPRESSION: 1. No acute findings.  No bowel obstruction. 2. Mild generalized increase in the colonic stool burden. No bowel inflammation. 3. Mild chronic intra and extrahepatic bile duct dilation stable from the prior CT. 4. Bilateral renal cortical thinning. 5. Aortic atherosclerosis. Electronically Signed   By: Lajean Manes M.D.   On: 08/18/2020 13:51   CT ABDOMEN PELVIS W CONTRAST  Result Date: 08/09/2020 CLINICAL DATA:  Abdominal distension EXAM: CT ABDOMEN AND PELVIS WITH CONTRAST TECHNIQUE: Multidetector CT  imaging of the abdomen and pelvis was performed using the standard protocol following bolus administration of intravenous contrast. CONTRAST:  164mL OMNIPAQUE IOHEXOL 300 MG/ML  SOLN COMPARISON:  05/17/2019 FINDINGS: Lower chest: No acute abnormality. Hepatobiliary: No focal liver abnormality is seen. Status post interval cholecystectomy. No biliary dilatation. Pancreas: Unremarkable. Spleen: Unremarkable. Adrenals/Urinary Tract: Adrenals are unremarkable. Kidneys and partially distended bladder are unremarkable. Stomach/Bowel: Stomach is within normal limits. There are few mildly dilated loops of small bowel with air-fluid levels. Distal colonic diverticulosis. Vascular/Lymphatic: Aortic atherosclerosis. No enlarged lymph nodes identified. Reproductive: Status post hysterectomy. No adnexal masses. Other: Small volume free fluid in the pelvis presumably related to above. No acute abnormality of the abdominal wall. Musculoskeletal: No acute osseous abnormality. IMPRESSION: Few mildly dilated loops of small bowel in the mid abdomen with air-fluid levels suggesting low-grade obstruction. Interval cholecystectomy. Colonic diverticulosis. Electronically Signed   By: Macy Mis M.D.   On: 08/09/2020 19:47       The results of significant diagnostics from this hospitalization (including imaging, microbiology, ancillary and laboratory) are listed below for reference.     Microbiology: Recent Results (from the past 240 hour(s))  SARS Coronavirus 2 by RT PCR (hospital order, performed in [facility] hospital lab) Nasopharyngeal Nasopharyngeal Swab     Status: None   Collection Time: 08/09/20  8:59 PM   Specimen: Nasopharyngeal Swab  Result Value Ref Range Status   SARS Coronavirus 2 NEGATIVE NEGATIVE Final    Comment: (NOTE) SARS-CoV-2 target nucleic acids are NOT DETECTED.  The SARS-CoV-2 RNA is generally detectable in upper and lower respiratory specimens during the acute phase of infection. The  lowest concentration of SARS-CoV-2 viral copies this assay can detect is 250 copies / mL. A negative result does not preclude SARS-CoV-2 infection and should not be used as the sole basis for treatment or other patient management decisions.  A negative result may occur with improper specimen collection / handling, submission of specimen other than nasopharyngeal swab, presence of viral mutation(s) within the areas targeted by this assay, and inadequate number of viral copies (<250 copies / mL). A negative result must be combined with clinical observations, patient history, and epidemiological information.  Fact Sheet for Patients:   StrictlyIdeas.no  Fact Sheet for Healthcare Providers: BankingDealers.co.za  This test is not yet approved or  cleared by the [place] FDA and has been authorized for detection and/or diagnosis of SARS-CoV-2 by FDA under an Emergency Use Authorization (EUA).  This EUA will remain in effect (meaning this test can be used) for the duration of the COVID-19 declaration under Section 564(b)(1) of the Act, 21 U.S.C. section 360bbb-3(b)(1), unless the authorization is terminated  or revoked sooner.  Performed at [facility], [place] [address], [place], [place] [address]   Culture, Urine     Status: Abnormal   Collection Time: 08/09/20  9:12 PM   Specimen: Urine, Clean Catch  Result Value Ref Range Status   Specimen Description   Final    URINE, CLEAN CATCH Performed at [facility], [place] [address] [place], [place] [address]    Special Requests   Final    NONE Performed at [facility], [place] [address], [place], [place] [address]    Culture (A)  Final    >=100,000 COLONIES/mL ESCHERICHIA COLI Confirmed Extended Spectrum Beta-Lactamase Producer (ESBL).  In bloodstream infections from ESBL organisms, carbapenems are preferred over  piperacillin/tazobactam. They are shown to have a lower risk of mortality.    Report Status 08/13/2020 FINAL  Final   Organism ID, Bacteria ESCHERICHIA COLI (A)  Final      Susceptibility   Escherichia coli - MIC*    AMPICILLIN >=32 RESISTANT Resistant     CEFAZOLIN >=64 RESISTANT Resistant     CEFTRIAXONE >=64 RESISTANT Resistant     CIPROFLOXACIN >=4 RESISTANT Resistant     GENTAMICIN <=1 SENSITIVE Sensitive     IMIPENEM <=0.25 SENSITIVE Sensitive     NITROFURANTOIN <=16 SENSITIVE Sensitive     TRIMETH/SULFA 40 SENSITIVE Sensitive     AMPICILLIN/SULBACTAM >=32 RESISTANT Resistant     PIP/TAZO <=4 SENSITIVE Sensitive     * >=100,000 COLONIES/mL ESCHERICHIA COLI  C Difficile Quick Screen (NO PCR Reflex)     Status: None   Collection Time: 08/12/20  5:47 PM   Specimen: STOOL  Result Value Ref Range Status   C Diff antigen NEGATIVE NEGATIVE Final   C Diff toxin NEGATIVE NEGATIVE Final   C Diff interpretation No C. difficile detected.  Final    Comment: Performed at [facility], [place] [address], [place],  [address]     Labs: BNP (last 3 results) No results for input(s): BNP in the last 8760 hours. Basic Metabolic Panel: Recent Labs  Lab 08/14/20 0531 08/15/20 0606 08/16/20 0514 08/17/20 0518 08/18/20 0535  NA 142 142 138 141 143  K 3.3* 3.1* 3.2* 3.1* 3.3*  CL 103 99 93* 92* 96*  CO2 31 36* 37* 39* 35*  GLUCOSE 97 99 119* 97 98  BUN 8 8 7* 9 12  CREATININE 1.11* 1.01* 1.04* 0.95 0.95  CALCIUM 8.1* 8.4* 8.3* 8.6* 8.9  MG  --   --   --   --  1.9  PHOS  --   --   --   --  3.1   Liver Function Tests: Recent Labs  Lab 08/18/20 0535  ALBUMIN 3.0*   No results for input(s): LIPASE, AMYLASE in the last 168 hours. No results for input(s): AMMONIA in the last 168 hours. CBC: Recent Labs  Lab 08/12/20 0519 08/13/20 0618 08/18/20 0535  WBC 6.3 7.1 6.8  HGB 9.1* 9.2* 11.2*  HCT 29.8* 31.0* 37.0  MCV 81.6 83.3 82.4  PLT 171 268 269    Cardiac Enzymes: No results for input(s): CKTOTAL, CKMB, CKMBINDEX, TROPONINI in the last 168 hours. BNP: Invalid input(s): POCBNP CBG: No results for input(s): GLUCAP in the last 168 hours. D-Dimer No results for input(s): DDIMER in the last 72 hours. Hgb A1c No results for input(s): HGBA1C in the last 72 hours. Lipid Profile No results for input(s): CHOL, HDL, LDLCALC, TRIG, CHOLHDL, LDLDIRECT in  the last 72 hours. Thyroid function studies No results for input(s): TSH, T4TOTAL, T3FREE, THYROIDAB in the last 72 hours.  Invalid input(s): FREET3 Anemia work up No results for input(s): VITAMINB12, FOLATE, FERRITIN, TIBC, IRON, RETICCTPCT in the last 72 hours. Urinalysis    Component Value Date/Time   COLORURINE AMBER (A) 08/09/2020 1842   APPEARANCEUR CLOUDY (A) 08/09/2020 1842   APPEARANCEUR Cloudy 09/18/2012 2000   LABSPEC 1.028 08/09/2020 1842   LABSPEC 1.012 09/18/2012 2000   PHURINE 5.0 08/09/2020 1842   GLUCOSEU NEGATIVE 08/09/2020 1842   GLUCOSEU Negative 09/18/2012 2000   HGBUR MODERATE (A) 08/09/2020 1842   BILIRUBINUR NEGATIVE 08/09/2020 1842   BILIRUBINUR negative 04/08/2019 1103   BILIRUBINUR Negative 09/18/2012 2000   KETONESUR 5 (A) 08/09/2020 1842   PROTEINUR 30 (A) 08/09/2020 1842   UROBILINOGEN 0.2 04/08/2019 1103   UROBILINOGEN 0.2 05/25/2017 1743   NITRITE POSITIVE (A) 08/09/2020 1842   LEUKOCYTESUR LARGE (A) 08/09/2020 1842   LEUKOCYTESUR Trace 09/18/2012 2000   Sepsis Labs Invalid input(s): PROCALCITONIN,  WBC,  LACTICIDVEN   Time coordinating discharge: 35 minutes  SIGNED:  Mercy Riding, MD  [facility] 08/18/2020, 3:14 PM  If 7PM-7AM, please contact night-coverage [email or web address]

## 2020-08-18 NOTE — Telephone Encounter (Signed)
Discussed with hospitalist caring for patient. He will send her home with some pain medication and nausea medication.

## 2020-08-18 NOTE — Plan of Care (Signed)
Pt was discharged home today. Instructions were reviewed with patient, and questions were answered. Pt was taken to main entrance via wheelchair by NT.  

## 2020-08-18 NOTE — Care Management Important Message (Signed)
Important Message  Patient Details IM Letter given to the Patient Name: Morgan [name] MRN: [id number] Date of Birth: [birth year]   Medicare Important Message Given:  Yes     Kerin Salen 08/18/2020, 2:23 PM

## 2020-08-19 ENCOUNTER — Telehealth: Payer: Self-pay

## 2020-08-19 NOTE — Telephone Encounter (Signed)
I spoke to pt and relayed information, per Dr. Einar Pheasant. Pt has a follow up appt scheduled for 08/29/20.

## 2020-08-19 NOTE — Telephone Encounter (Signed)
Patient states that the hospital stopped her Lasix. She is concerned about retaining fluid and needs someone to call her and let her know if this is correct.

## 2020-08-19 NOTE — Telephone Encounter (Signed)
In review of the chart I believe they wanted her to hold it for a few days and restart. This is likely due to anticipated nausea and decreased hydration.  Would recommend that she monitor her weight and fluid status (swelling). If noticing weight gain or swelling can restart.  Please also make sure she has a hospital follow-up next week.

## 2020-08-19 NOTE — Telephone Encounter (Signed)
Transition Care Management Follow-up Telephone Call  Date of discharge and from where: 08/18/2020, [facility]   How have you been since you were released from the hospital? Patient states that she is feeling better.  Any questions or concerns? Yes, hospital stopped her lasix and she is concerned about retaining fluid.   Items Reviewed:  Did the pt receive and understand the discharge instructions provided? Yes   Medications obtained and verified? Yes   Any new allergies since your discharge? No   Dietary orders reviewed? Yes  Do you have support at home? Yes   Functional Questionnaire: (I = Independent and D = Dependent) ADLs: I  Bathing/Dressing- I  Meal Prep- I  Eating- I  Maintaining continence- I  Transferring/Ambulation- I  Managing Meds- I  Follow up appointments reviewed:   PCP Hospital f/u appt confirmed? Yes  Scheduled to see Dr. Einar Pheasant on 08/29/2020 @ 8:40 am.  [facility] f/u appt confirmed? N/A   Are transportation arrangements needed? No   If their condition worsens, is the pt aware to call PCP or go to the Emergency Dept.? Yes  Was the patient provided with contact information for the PCP's office or ED? Yes  Was to pt encouraged to call back with questions or concerns? Yes

## 2020-08-19 NOTE — Telephone Encounter (Signed)
Hospital follow up was scheduled for 08/29/2020 @ 8:40 am

## 2020-08-23 NOTE — Telephone Encounter (Signed)
Please check in with patient to see if she is doing OK and offer earlier appointment if needed - pain control and lasix management.

## 2020-08-24 NOTE — Telephone Encounter (Signed)
If she is still in significant pain and running out of medication she needs an appointment to be seen. Please see if an urgent visit can be scheduled

## 2020-08-24 NOTE — Telephone Encounter (Signed)
Spoke to pt and she is still in a great amount of pain. She states that it hasn't gotten any better since she has been out of the hospital and it is the worse pain she has ever been in. She has asked if she can get something sent in for pain due to almost being out of the oxycodone she got from hospital. If she can get pain meds to get her to her appt on Monday then that's what she prefers.

## 2020-08-24 NOTE — Telephone Encounter (Signed)
Will you please see if any other provider can see pt this week and call pt to schedule if we can?

## 2020-08-25 ENCOUNTER — Telehealth: Payer: Self-pay

## 2020-08-25 NOTE — Telephone Encounter (Signed)
Pt called triage line and left a message. I called her back. She said she was returning a call from [place] about being seen for her abd pain. She want to be seen sooner than Monday with Dr Einar Pheasant. I offered her tomorrow with Dr Diona Browner. She stated if she had to wait until tomorrow (Friday), she would wait until Monday to see Dr Einar Pheasant.

## 2020-08-25 NOTE — Telephone Encounter (Signed)
opened in error

## 2020-08-29 ENCOUNTER — Other Ambulatory Visit: Payer: Self-pay

## 2020-08-29 ENCOUNTER — Ambulatory Visit (INDEPENDENT_AMBULATORY_CARE_PROVIDER_SITE_OTHER): Payer: Medicare HMO | Admitting: Family Medicine

## 2020-08-29 VITALS — BP 140/78 | HR 64 | Temp 97.2°F | Ht 73.0 in | Wt 237.5 lb

## 2020-08-29 DIAGNOSIS — K311 Adult hypertrophic pyloric stenosis: Secondary | ICD-10-CM

## 2020-08-29 DIAGNOSIS — J9611 Chronic respiratory failure with hypoxia: Secondary | ICD-10-CM

## 2020-08-29 DIAGNOSIS — R1030 Lower abdominal pain, unspecified: Secondary | ICD-10-CM

## 2020-08-29 DIAGNOSIS — D509 Iron deficiency anemia, unspecified: Secondary | ICD-10-CM

## 2020-08-29 DIAGNOSIS — Z23 Encounter for immunization: Secondary | ICD-10-CM

## 2020-08-29 DIAGNOSIS — K279 Peptic ulcer, site unspecified, unspecified as acute or chronic, without hemorrhage or perforation: Secondary | ICD-10-CM

## 2020-08-29 DIAGNOSIS — I509 Heart failure, unspecified: Secondary | ICD-10-CM | POA: Diagnosis not present

## 2020-08-29 LAB — CBC
HCT: 31.8 % — ABNORMAL LOW (ref 36.0–46.0)
Hemoglobin: 10 g/dL — ABNORMAL LOW (ref 12.0–15.0)
MCHC: 31.4 g/dL (ref 30.0–36.0)
MCV: 77.3 fl — ABNORMAL LOW (ref 78.0–100.0)
Platelets: 338 10*3/uL (ref 150.0–400.0)
RBC: 4.11 Mil/uL (ref 3.87–5.11)
RDW: 15.5 % (ref 11.5–15.5)
WBC: 8.2 10*3/uL (ref 4.0–10.5)

## 2020-08-29 LAB — BASIC METABOLIC PANEL
BUN: 20 mg/dL (ref 6–23)
CO2: 32 mEq/L (ref 19–32)
Calcium: 9.3 mg/dL (ref 8.4–10.5)
Chloride: 102 mEq/L (ref 96–112)
Creatinine, Ser: 1.19 mg/dL (ref 0.40–1.20)
GFR: 45.22 mL/min — ABNORMAL LOW (ref >60.00)
Glucose, Bld: 92 mg/dL (ref 70–99)
Potassium: 4.4 mEq/L (ref 3.5–5.1)
Sodium: 143 mEq/L (ref 135–145)

## 2020-08-29 LAB — MAGNESIUM: Magnesium: 2.3 mg/dL (ref 1.5–2.5)

## 2020-08-29 MED ORDER — OXYCODONE HCL 5 MG PO CAPS: 5.0000 mg | ORAL_CAPSULE | Freq: Three times a day (TID) | ORAL | 0 refills | Status: DC | PRN

## 2020-08-29 MED ORDER — OMEPRAZOLE 40 MG PO CPDR: 40.0000 mg | DELAYED_RELEASE_CAPSULE | Freq: Every day | ORAL | 3 refills | Status: DC

## 2020-08-29 NOTE — Assessment & Plan Note (Addendum)
With nausea, vomiting resolved. Trial of liquid diet. Has had 2 dilations and reports another planned at next GI appt. Advised reaching out to GI given persistence of symptoms (nausea/abdominal pain). Advised full liquid diet for a few days to see if symptoms improve and slow advancement. Difficult to determine what is causing her severe symptoms.

## 2020-08-29 NOTE — Assessment & Plan Note (Signed)
Restarted lasix with some edema today but baseline per patient. Lung clear. Cont lasix. Labs today to assess electrolytes.

## 2020-08-29 NOTE — Assessment & Plan Note (Signed)
Breathing well on RA. Lungs clear. Continue home management

## 2020-08-29 NOTE — Patient Instructions (Addendum)
Call GI to see if they can see you sooner    Food Choices for Gastroesophageal Reflux Disease, Adult When you have gastroesophageal reflux disease (GERD), the foods you eat and your eating habits are very important. Choosing the right foods can help ease your discomfort. Think about working with a nutrition specialist (dietitian) to help you make good choices. What are tips for following this plan?  Meals  Choose healthy foods that are low in fat, such as fruits, vegetables, whole grains, low-fat dairy products, and lean meat, fish, and poultry.  Eat small meals often instead of 3 large meals a day. Eat your meals slowly, and in a place where you are relaxed. Avoid bending over or lying down until 2-3 hours after eating.  Avoid eating meals 2-3 hours before bed.  Avoid drinking a lot of liquid with meals.  Cook foods using methods other than frying. Bake, grill, or broil food instead.  Avoid or limit: ? Chocolate. ? Peppermint or spearmint. ? Alcohol. ? Pepper. ? Black and decaffeinated coffee. ? Black and decaffeinated tea. ? Bubbly (carbonated) soft drinks. ? Caffeinated energy drinks and soft drinks.  Limit high-fat foods such as: ? Fatty meat or fried foods. ? Whole milk, cream, butter, or ice cream. ? Nuts and nut butters. ? Pastries, donuts, and sweets made with butter or shortening.  Avoid foods that cause symptoms. These foods may be different for everyone. Common foods that cause symptoms include: ? Tomatoes. ? Oranges, lemons, and limes. ? Peppers. ? Spicy food. ? Onions and garlic. ? Vinegar.   Full Liquid Diet - follow this diet for 1-2 days, then slowly add in more solid food A full liquid diet refers to fluids and foods that are liquid or will become liquid at room temperature. This diet should only be used for a short period of time to help you recover from illness or surgery. Your health care provider or dietitian will help you determine when it is safe  to eat regular foods. What are tips for following this plan?     Reading food labels  Check food labels of nutrition shakes for the amount of protein. Look for nutrition shakes that have at least 8-10 grams of protein in each serving.  Look for drinks, such as milks and juices, that are "fortified" or "enriched." This means that vitamins and minerals have been added. Shopping  Buy premade nutritional shakes to keep on hand.  To vary your choices, buy different flavors of milks and shakes. Meal planning  Choose flavors and foods that you enjoy.  To make sure you get enough energy from food (calories): ? Eat 3 full liquid meals each day. Have a liquid snack between each meal. ? Drink 6-8 ounces (177-237 ml) of a nutrition supplement shake with meals or as snacks. ? Add protein powder, powdered milk, milk, or yogurt to shakes to increase the amount of protein.  Drink at least one serving a day of citrus fruit juice or fruit juice that has vitamin C added. General guidelines  Before starting the full-liquid diet, check with your health care provider to know what foods you should avoid. These may include full-fat or high-fiber liquids.  You may have any liquid or food that becomes a liquid at room temperature. The food is considered a liquid if it can be poured off a spoon at room temperature.  Do not drink alcohol unless approved by your health care provider.  This diet gives you most of the  nutrients that you need for energy, but you may not get enough of certain vitamins, minerals, and fiber. Make sure to talk to your health care provider or dietitian about: ? How many calories you need to eat get day. ? How much fluid you should have each day. ? Taking a multivitamin or a nutritional supplement. What foods are allowed? The items listed may not be a complete list. Talk with your dietitian about what dietary choices are best for you. Grains Thin hot cereal, such as cream of  wheat. Soft-cooked pasta or rice pured in soup. Vegetables Pulp-free tomato or vegetable juice. Vegetables pured in soup. Fruits Fruit juice without pulp. Strained fruit pures (seeds and skins removed). Meats and other protein foods Beef, chicken, and fish broths. Powdered protein supplements. Dairy Milk and milk-based beverages, including milk shakes and instant breakfast mixes. Smooth yogurt. Pured cottage cheese. Beverages Water. Coffee and tea (caffeinated or decaffeinated). Cocoa. Liquid nutritional supplements. Soft drinks. Nondairy milks, such as almond, coconut, rice, or soy milk. Fats and oils Melted margarine and butter. Cream. Canola, almond, avocado, corn, grapeseed, sunflower, and sesame oils. Gravy. Sweets and desserts Custard. Pudding. Flavored gelatin. Smooth ice cream (without nuts or candy pieces). Sherbet. Popsicles. [place] ice. Pudding pops. Seasoning and other foods Salt and pepper. Spices. Cocoa powder. Vinegar. Ketchup. Yellow mustard. Smooth sauces, such as [place], cheese sauce, or white sauce. Soy sauce. Cream soups. Strained soups. Syrup. Honey. Jelly (without fruit pieces). What foods are not allowed? The items listed may not be a complete list. Talk with your dietitian about what dietary choices are best for you. Grains Whole grains. Pasta. Rice. Cold cereal. Bread. Crackers. Vegetables All whole fresh, frozen, or canned vegetables. Fruits All whole fresh, frozen, or canned fruits. Meats and other protein foods All cuts of meat, poultry, and fish. Eggs. Tofu and soy protein. Nuts and nut butters. Lunch meat. Sausage. Dairy Hard cheese. Yogurt with fruit chunks. Fats and oils Coconut oil. Palm oil. Lard. Cold butter. Sweets and desserts Ice cream or other frozen desserts that have any solids in them or on top, such as nuts, chocolate chips, and pieces of cookies. Cakes. Cookies. Candy. Seasoning and other foods Stone-ground mustards. Soups with  chunks or pieces. Summary  A full liquid diet refers to fluids and foods that are liquid or will become liquid at room temperature.  This diet should only be used for a short period of time to help you recover from illness or surgery. Ask your health care provider or dietitian when it is safe for you to eat regular foods.  To make sure you get enough calories and nutrients, eat 3 meals each day with snacks between. Drink premade nutrition supplement shakes or add protein powder to homemade shakes. Take a vitamin and mineral supplement as told by your health care provider. This information is not intended to replace advice given to you by your health care provider. Make sure you discuss any questions you have with your health care provider. Document Revised: 03/01/2018 Document Reviewed: 01/16/2017 Elsevier Patient Education  2020 [employer].

## 2020-08-29 NOTE — Assessment & Plan Note (Signed)
Check labs given recent hospital stay.

## 2020-08-29 NOTE — Assessment & Plan Note (Signed)
Continue sucralfate and omeprazole. Notes worsening abdominal pain w/ eating and unclear if heartburn related. Advised GERD diet to see if symptoms improved. As worse with [place] food

## 2020-08-29 NOTE — Assessment & Plan Note (Addendum)
Pt with recent hospitalization for SBO partial and with noted diverticulosis. She notes worsening abdominal pain since her discharge. Is tender and complains of LUQ and LLQ abdominal pain. Concerning for possible diverticulitis. Will get CT scan to assess. Of note, she also has GERD and pyloric stenosis and pain worse with eating which could be clouding clinical diagnosis but without serial abdominal exam and given high risk patient discussed imaging today to rule out. Oxycodone provided but advised limiting use as can worsening abdominal pain/constipation

## 2020-08-29 NOTE — Progress Notes (Addendum)
Subjective:     Morgan [name] is a 69 y.o. female presenting for Hospitalization Follow-up     HPI   #Diet restriction questions - last Monday - ate [place] and had severe pain - still having constant pain along the left side - has lost 12 lbs since being the hospital - everything she eats causes pain - pyloric stenosis - did a dilation in the hospital and planning at in October - taking oxycodone for pain - took her last pill last Thursday - has been in bed all weekend due to pain - not having medication  - loose stools - not constipated  Continues to take PPI and sucralfate  Abdominal pain is worse after eating   Endorses persistent nausea but no vomiting Feels like she gets flare ups and will try different things and feel sick for days  #CHF - restarted lasix on 08/25/2020 - was swelling bad and this has improved - breathing is good - breathing is worse in the heat - was on vacation and had a hard time getting back into the hotel   Review of Systems  Constitutional: Negative for chills and fever.    8/24-08/18/2020: Admission - SBO - s/p recent dialation of the pylorus with persistent symptoms - Treated for partial SBO and UTI w/ meropenum. GI recommended opiate and imodium. Repeat CT w/o findings. Metamucil and probiotics on discharge  Social History   Tobacco Use  Smoking Status Former Smoker  . Packs/day: 1.00  . Years: 15.00  . Pack years: 15.00  . Types: Cigarettes  . Quit date: 09/12/2017  . Years since quitting: 2.9  Smokeless Tobacco Never Used        Objective:    BP Readings from Last 3 Encounters:  08/29/20 140/78  08/18/20 134/70  07/04/20 (!) 164/77   Wt Readings from Last 3 Encounters:  08/29/20 237 lb 8 oz (107.7 kg)  08/11/20 243 lb (110.2 kg)  07/04/20 243 lb (110.2 kg)    BP 140/78   Pulse 64   Temp (!) 97.2 F (36.2 C) (Temporal)   Ht 6\' 1"  (1.854 m)   Wt 237 lb 8 oz (107.7 kg)   SpO2 98%   BMI 31.33 kg/m    Physical  Exam Constitutional:      General: She is not in acute distress.    Appearance: She is well-developed. She is obese. She is not diaphoretic.  HENT:     Right Ear: External ear normal.     Left Ear: External ear normal.     Nose: Nose normal.  Eyes:     Conjunctiva/sclera: Conjunctivae normal.  Cardiovascular:     Rate and Rhythm: Normal rate and regular rhythm.     Heart sounds: No murmur heard.   Pulmonary:     Effort: Pulmonary effort is normal. No respiratory distress.     Breath sounds: Normal breath sounds. No wheezing.  Abdominal:     General: Abdomen is protuberant. Bowel sounds are normal. There is distension.     Palpations: There is no hepatomegaly or splenomegaly.     Tenderness: There is abdominal tenderness in the epigastric area, periumbilical area, left upper quadrant and left lower quadrant. There is guarding. There is no rebound.  Musculoskeletal:     Cervical back: Neck supple.  Skin:    General: Skin is warm and dry.     Capillary Refill: Capillary refill takes less than 2 seconds.  Neurological:     Mental Status:  She is alert. Mental status is at baseline.  Psychiatric:        Mood and Affect: Mood normal.        Behavior: Behavior normal.           Assessment & Plan:   Problem List Items Addressed This Visit      Cardiovascular and Mediastinum   CHF (congestive heart failure) ([facility])    Restarted lasix with some edema today but baseline per patient. Lung clear. Cont lasix. Labs today to assess electrolytes.       Relevant Orders   Basic metabolic panel   Magnesium     Respiratory   Chronic respiratory failure with hypoxia (HCC)    Breathing well on RA. Lungs clear. Continue home management        Digestive   Peptic ulcer disease    Continue sucralfate and omeprazole. Notes worsening abdominal pain w/ eating and unclear if heartburn related. Advised GERD diet to see if symptoms improved. As worse with [place] food      Relevant  Medications   omeprazole (PRILOSEC) 40 MG capsule   Pyloric stenosis in adult    With nausea, vomiting resolved. Trial of liquid diet. Has had 2 dilations and reports another planned at next GI appt. Advised reaching out to GI given persistence of symptoms (nausea/abdominal pain). Advised full liquid diet for a few days to see if symptoms improve and slow advancement. Difficult to determine what is causing her severe symptoms.         Other   Abdominal pain    Pt with recent hospitalization for SBO partial and with noted diverticulosis. She notes worsening abdominal pain since her discharge. Is tender and complains of LUQ and LLQ abdominal pain. Concerning for possible diverticulitis. Will get CT scan to assess. Of note, she also has GERD and pyloric stenosis and pain worse with eating which could be clouding clinical diagnosis but without serial abdominal exam and given high risk patient discussed imaging today to rule out. Oxycodone provided but advised limiting use as can worsening abdominal pain/constipation      Relevant Medications   oxycodone (OXY-IR) 5 MG capsule   Other Relevant Orders   CT Abdomen Pelvis W Contrast   Iron deficiency anemia    Check labs given recent hospital stay.       Relevant Orders   CBC    Other Visit Diagnoses    Need for influenza vaccination    -  Primary   Relevant Orders   Flu Vaccine QUAD High Dose(Fluad) (Completed)       Return in about 4 weeks (around 09/26/2020) for check in .  [name], MD  This visit occurred during the SARS-CoV-2 public health emergency.  Safety protocols were in place, including screening questions prior to the visit, additional usage of staff PPE, and extensive cleaning of exam room while observing appropriate contact time as indicated for disinfecting solutions.

## 2020-08-30 ENCOUNTER — Ambulatory Visit
Admission: RE | Admit: 2020-08-30 | Discharge: 2020-08-30 | Disposition: A | Discharge status: Home / Self Care | Payer: Medicare HMO | Source: Ambulatory Visit | Attending: Family Medicine | Admitting: Family Medicine

## 2020-08-30 ENCOUNTER — Telehealth: Payer: Self-pay | Admitting: Family Medicine

## 2020-08-30 DIAGNOSIS — R1030 Lower abdominal pain, unspecified: Secondary | ICD-10-CM | POA: Insufficient documentation

## 2020-08-30 DIAGNOSIS — K5792 Diverticulitis of intestine, part unspecified, without perforation or abscess without bleeding: Secondary | ICD-10-CM

## 2020-08-30 HISTORY — DX: Heart failure, unspecified: I50.9

## 2020-08-30 MED ORDER — CIPROFLOXACIN HCL 500 MG PO TABS: 500.0000 mg | ORAL_TABLET | Freq: Two times a day (BID) | ORAL | 0 refills | Status: DC

## 2020-08-30 MED ORDER — IOHEXOL 300 MG/ML  SOLN
100.0000 mL | Freq: Once | INTRAMUSCULAR | Status: AC | PRN
  Administered 2020-08-30: 100 mL via INTRAVENOUS

## 2020-08-30 MED ORDER — METRONIDAZOLE 500 MG PO TABS: 500.0000 mg | ORAL_TABLET | Freq: Three times a day (TID) | ORAL | 0 refills | Status: AC

## 2020-08-30 NOTE — Telephone Encounter (Signed)
Please call patient and let her know she may have a slight infection as we discussed. Based on her CT scan.   Given her pain I would recommend antibiotics to treat this.   Would also recommend appointment on Thursday with me to make sure she is improving. She can cancel if she is significantly improved.

## 2020-08-30 NOTE — Telephone Encounter (Signed)
Patient notified as instructed by telephone and verbalized understanding. Patient scheduled for an appointment Thursday 09/01/20 at 9:00 am with Dr. Einar Pheasant.

## 2020-09-01 ENCOUNTER — Encounter: Payer: Self-pay | Admitting: Family Medicine

## 2020-09-01 ENCOUNTER — Ambulatory Visit (INDEPENDENT_AMBULATORY_CARE_PROVIDER_SITE_OTHER): Payer: Medicare HMO | Admitting: Family Medicine

## 2020-09-01 ENCOUNTER — Other Ambulatory Visit: Payer: Self-pay

## 2020-09-01 VITALS — BP 122/62 | HR 69 | Temp 98.2°F | Wt 239.0 lb

## 2020-09-01 DIAGNOSIS — J449 Chronic obstructive pulmonary disease, unspecified: Secondary | ICD-10-CM | POA: Diagnosis not present

## 2020-09-01 DIAGNOSIS — K5792 Diverticulitis of intestine, part unspecified, without perforation or abscess without bleeding: Secondary | ICD-10-CM | POA: Diagnosis not present

## 2020-09-01 DIAGNOSIS — I5032 Chronic diastolic (congestive) heart failure: Secondary | ICD-10-CM

## 2020-09-01 LAB — CBC WITH DIFFERENTIAL/PLATELET
Basophils Absolute: 0 10*3/uL (ref 0.0–0.1)
Basophils Relative: 0.4 % (ref 0.0–3.0)
Eosinophils Absolute: 0.4 10*3/uL (ref 0.0–0.7)
Eosinophils Relative: 6 % — ABNORMAL HIGH (ref 0.0–5.0)
HCT: 30.9 % — ABNORMAL LOW (ref 36.0–46.0)
Hemoglobin: 10 g/dL — ABNORMAL LOW (ref 12.0–15.0)
Lymphocytes Relative: 30.1 % (ref 12.0–46.0)
Lymphs Abs: 2.1 10*3/uL (ref 0.7–4.0)
MCHC: 32.3 g/dL (ref 30.0–36.0)
MCV: 76.6 fl — ABNORMAL LOW (ref 78.0–100.0)
Monocytes Absolute: 0.4 10*3/uL (ref 0.1–1.0)
Monocytes Relative: 5.4 % (ref 3.0–12.0)
Neutro Abs: 4.1 10*3/uL (ref 1.4–7.7)
Neutrophils Relative %: 58.1 % (ref 43.0–77.0)
Platelets: 295 10*3/uL (ref 150.0–400.0)
RBC: 4.04 Mil/uL (ref 3.87–5.11)
RDW: 15.5 % (ref 11.5–15.5)
WBC: 7 10*3/uL (ref 4.0–10.5)

## 2020-09-01 LAB — COMPREHENSIVE METABOLIC PANEL
ALT: 10 U/L (ref 0–35)
AST: 16 U/L (ref 0–37)
Albumin: 3.5 g/dL (ref 3.5–5.2)
Alkaline Phosphatase: 123 U/L — ABNORMAL HIGH (ref 39–117)
BUN: 16 mg/dL (ref 6–23)
CO2: 32 mEq/L (ref 19–32)
Calcium: 8.6 mg/dL (ref 8.4–10.5)
Chloride: 101 mEq/L (ref 96–112)
Creatinine, Ser: 1.09 mg/dL (ref 0.40–1.20)
GFR: 50.04 mL/min — ABNORMAL LOW (ref >60.00)
Glucose, Bld: 101 mg/dL — ABNORMAL HIGH (ref 70–99)
Potassium: 3.6 mEq/L (ref 3.5–5.1)
Sodium: 139 mEq/L (ref 135–145)
Total Bilirubin: 0.3 mg/dL (ref 0.2–1.2)
Total Protein: 6.2 g/dL (ref 6.0–8.3)

## 2020-09-01 LAB — LIPASE: Lipase: 7 U/L — ABNORMAL LOW (ref 11.0–59.0)

## 2020-09-01 NOTE — Assessment & Plan Note (Signed)
Stable edema and no crackles. Cont current management

## 2020-09-01 NOTE — Patient Instructions (Signed)
Continue Antibiotics Do Clear liquid diet - water, broth

## 2020-09-01 NOTE — Progress Notes (Signed)
Subjective:     Morgan [name] is a 69 y.o. female presenting for Follow-up (3 day)     HPI   #LLQ abdominal pain - still with shooting pain - nausea is still persistent - no improvement - no vomiting - still with normal BM - limited eating - oatmeal, mash potatoes, tomato soup  - drinking - water bottle - large drinks 2-3 per day - started antibiotics - just feeling tired, otherwise good   Breathing is not good - feeling out of breath easily - just taking taking a shower  - no chest pain - sob - no wheezing - no cough  Swelling is OK    Review of Systems   Social History   Tobacco Use  Smoking Status Former Smoker  . Packs/day: 1.00  . Years: 15.00  . Pack years: 15.00  . Types: Cigarettes  . Quit date: 09/12/2017  . Years since quitting: 2.9  Smokeless Tobacco Never Used        Objective:    BP Readings from Last 3 Encounters:  09/01/20 122/62  08/29/20 140/78  08/18/20 134/70   Wt Readings from Last 3 Encounters:  09/01/20 239 lb (108.4 kg)  08/29/20 237 lb 8 oz (107.7 kg)  08/11/20 243 lb (110.2 kg)    BP 122/62   Pulse 69   Temp 98.2 F (36.8 C) (Temporal)   Wt 239 lb (108.4 kg)   SpO2 95%   BMI 31.53 kg/m    Physical Exam Constitutional:      General: She is not in acute distress.    Appearance: She is well-developed. She is not diaphoretic.  HENT:     Right Ear: External ear normal.     Left Ear: External ear normal.     Nose: Nose normal.  Eyes:     Conjunctiva/sclera: Conjunctivae normal.  Cardiovascular:     Rate and Rhythm: Normal rate and regular rhythm.     Heart sounds: No murmur heard.   Pulmonary:     Effort: Pulmonary effort is normal. No respiratory distress.     Breath sounds: Normal breath sounds. No wheezing or rales.  Abdominal:     General: Bowel sounds are normal. There is distension.     Palpations: Abdomen is soft.     Tenderness: There is abdominal tenderness in the right upper quadrant, epigastric  area, periumbilical area and left lower quadrant.     Comments: Generalized ttp  Musculoskeletal:     Cervical back: Neck supple.     Comments: Trace to 1+ edema bilateral  Skin:    General: Skin is warm and dry.     Capillary Refill: Capillary refill takes less than 2 seconds.  Neurological:     Mental Status: She is alert. Mental status is at baseline.  Psychiatric:        Mood and Affect: Mood normal.        Behavior: Behavior normal.    Lab Results  Component Value Date   WBC 7.0 09/01/2020   HGB 10.0 (L) 09/01/2020   HCT 30.9 (L) 09/01/2020   MCV 76.6 (L) 09/01/2020   PLT 295.0 09/01/2020    Lab Results  Component Value Date   LIPASE 7.0 (L) 09/01/2020   CMP Latest Ref Rng & Units 09/01/2020 08/29/2020 08/18/2020  Glucose 70 - 99 mg/dL 101(H) 92 98  BUN 6 - 23 mg/dL 16 20 12   Creatinine 0.40 - 1.20 mg/dL 1.09 1.19 0.95  Sodium 135 -  145 mEq/L 139 143 143  Potassium 3.5 - 5.1 mEq/L 3.6 4.4 3.3(L)  Chloride 96 - 112 mEq/L 101 102 96(L)  CO2 19 - 32 mEq/L 32 32 35(H)  Calcium 8.4 - 10.5 mg/dL 8.6 9.3 8.9  Total Protein 6.0 - 8.3 g/dL 6.2 - -  Total Bilirubin 0.2 - 1.2 mg/dL 0.3 - -  Alkaline Phos 39 - 117 U/L 123(H) - -  AST 0 - 37 U/L 16 - -  ALT 0 - 35 U/L 10 - -         Assessment & Plan:   Problem List Items Addressed This Visit      Cardiovascular and Mediastinum   Chronic diastolic CHF (congestive heart failure) (HCC)    Stable edema and no crackles. Cont current management        Respiratory   COPD (chronic obstructive pulmonary disease) (HCC)    Good air movement, no wheezes. Endorses sob with activity and fatigue but low suspicion for COPD exacerbation. Suspect is related to pain and post-hospital fatigue.         Other   Diverticulitis - Primary    On day 2 of antibiotic treatment with worsening abdominal pain on the left side. No fever/chills. Does have significant guarding without rebound symptoms. Advised clear liquid diet, cont abx, return  1 day for serial abdominal exam. Labs reassuring with normal WBC and stable liver/kidney function. ER precautions discussed      Relevant Orders   Comprehensive metabolic panel (Completed)   CBC with Differential (Completed)   Lipase (Completed)       Return in about 1 day (around 09/02/2020) for Dr. Damita Dunnings.  [name], MD  This visit occurred during the SARS-CoV-2 public health emergency.  Safety protocols were in place, including screening questions prior to the visit, additional usage of staff PPE, and extensive cleaning of exam room while observing appropriate contact time as indicated for disinfecting solutions.

## 2020-09-01 NOTE — Assessment & Plan Note (Signed)
Good air movement, no wheezes. Endorses sob with activity and fatigue but low suspicion for COPD exacerbation. Suspect is related to pain and post-hospital fatigue.

## 2020-09-01 NOTE — Assessment & Plan Note (Addendum)
On day 2 of antibiotic treatment with worsening abdominal pain on the left side. No fever/chills. Does have significant guarding without rebound symptoms. Advised clear liquid diet, cont abx, return 1 day for serial abdominal exam. Labs reassuring with normal WBC and stable liver/kidney function. ER precautions discussed

## 2020-09-02 ENCOUNTER — Inpatient Hospital Stay (HOSPITAL_COMMUNITY): Payer: Medicare HMO

## 2020-09-02 ENCOUNTER — Encounter (HOSPITAL_COMMUNITY): Payer: Self-pay

## 2020-09-02 ENCOUNTER — Other Ambulatory Visit: Payer: Self-pay

## 2020-09-02 ENCOUNTER — Ambulatory Visit (INDEPENDENT_AMBULATORY_CARE_PROVIDER_SITE_OTHER): Payer: Medicare HMO | Admitting: Family Medicine

## 2020-09-02 ENCOUNTER — Encounter: Payer: Self-pay | Admitting: Family Medicine

## 2020-09-02 ENCOUNTER — Emergency Department (HOSPITAL_COMMUNITY)
Admission: EM | Admit: 2020-09-02 | Discharge: 2020-09-02 | DRG: 392 | Disposition: A | Discharge status: Home / Self Care | Payer: Medicare HMO | Attending: Internal Medicine | Admitting: Internal Medicine

## 2020-09-02 DIAGNOSIS — I5032 Chronic diastolic (congestive) heart failure: Secondary | ICD-10-CM | POA: Diagnosis not present

## 2020-09-02 DIAGNOSIS — K219 Gastro-esophageal reflux disease without esophagitis: Secondary | ICD-10-CM | POA: Diagnosis not present

## 2020-09-02 DIAGNOSIS — Z79899 Other long term (current) drug therapy: Secondary | ICD-10-CM | POA: Diagnosis not present

## 2020-09-02 DIAGNOSIS — N183 Chronic kidney disease, stage 3 unspecified: Secondary | ICD-10-CM | POA: Diagnosis present

## 2020-09-02 DIAGNOSIS — Z91018 Allergy to other foods: Secondary | ICD-10-CM | POA: Diagnosis not present

## 2020-09-02 DIAGNOSIS — R109 Unspecified abdominal pain: Secondary | ICD-10-CM | POA: Diagnosis present

## 2020-09-02 DIAGNOSIS — Z83438 Family history of other disorder of lipoprotein metabolism and other lipidemia: Secondary | ICD-10-CM | POA: Diagnosis not present

## 2020-09-02 DIAGNOSIS — Z20822 Contact with and (suspected) exposure to covid-19: Secondary | ICD-10-CM | POA: Diagnosis present

## 2020-09-02 DIAGNOSIS — K5792 Diverticulitis of intestine, part unspecified, without perforation or abscess without bleeding: Secondary | ICD-10-CM

## 2020-09-02 DIAGNOSIS — Z8249 Family history of ischemic heart disease and other diseases of the circulatory system: Secondary | ICD-10-CM | POA: Diagnosis not present

## 2020-09-02 DIAGNOSIS — Z91030 Bee allergy status: Secondary | ICD-10-CM | POA: Diagnosis not present

## 2020-09-02 DIAGNOSIS — Z9981 Dependence on supplemental oxygen: Secondary | ICD-10-CM

## 2020-09-02 DIAGNOSIS — I130 Hypertensive heart and chronic kidney disease with heart failure and stage 1 through stage 4 chronic kidney disease, or unspecified chronic kidney disease: Secondary | ICD-10-CM | POA: Diagnosis present

## 2020-09-02 DIAGNOSIS — J449 Chronic obstructive pulmonary disease, unspecified: Secondary | ICD-10-CM | POA: Diagnosis not present

## 2020-09-02 DIAGNOSIS — E785 Hyperlipidemia, unspecified: Secondary | ICD-10-CM | POA: Diagnosis not present

## 2020-09-02 DIAGNOSIS — Z888 Allergy status to other drugs, medicaments and biological substances status: Secondary | ICD-10-CM | POA: Diagnosis not present

## 2020-09-02 LAB — COMPREHENSIVE METABOLIC PANEL
ALT: 12 U/L (ref 0–44)
AST: 20 U/L (ref 15–41)
Albumin: 3.4 g/dL — ABNORMAL LOW (ref 3.5–5.0)
Alkaline Phosphatase: 122 U/L (ref 38–126)
Anion gap: 8 (ref 5–15)
BUN: 13 mg/dL (ref 8–23)
CO2: 28 mmol/L (ref 22–32)
Calcium: 8.6 mg/dL — ABNORMAL LOW (ref 8.9–10.3)
Chloride: 103 mmol/L (ref 98–111)
Creatinine, Ser: 1.01 mg/dL — ABNORMAL HIGH (ref 0.44–1.00)
GFR calc Af Amer: >60 mL/min (ref >60)
GFR calc non Af Amer: 58 mL/min — ABNORMAL LOW (ref >60)
Glucose, Bld: 107 mg/dL — ABNORMAL HIGH (ref 70–99)
Potassium: 3.3 mmol/L — ABNORMAL LOW (ref 3.5–5.1)
Sodium: 139 mmol/L (ref 135–145)
Total Bilirubin: 0.5 mg/dL (ref 0.3–1.2)
Total Protein: 6.9 g/dL (ref 6.5–8.1)

## 2020-09-02 LAB — URINALYSIS, ROUTINE W REFLEX MICROSCOPIC
Bilirubin Urine: NEGATIVE
Glucose, UA: NEGATIVE mg/dL
Ketones, ur: NEGATIVE mg/dL
Nitrite: NEGATIVE
Protein, ur: NEGATIVE mg/dL
Specific Gravity, Urine: 1.03 (ref 1.005–1.030)
pH: 7 (ref 5.0–8.0)

## 2020-09-02 LAB — CBC
HCT: 33.9 % — ABNORMAL LOW (ref 36.0–46.0)
Hemoglobin: 10.4 g/dL — ABNORMAL LOW (ref 12.0–15.0)
MCH: 24.2 pg — ABNORMAL LOW (ref 26.0–34.0)
MCHC: 30.7 g/dL (ref 30.0–36.0)
MCV: 79 fL — ABNORMAL LOW (ref 80.0–100.0)
Platelets: 326 10*3/uL (ref 150–400)
RBC: 4.29 MIL/uL (ref 3.87–5.11)
RDW: 15.1 % (ref 11.5–15.5)
WBC: 7.3 10*3/uL (ref 4.0–10.5)
nRBC: 0 % (ref 0.0–0.2)

## 2020-09-02 LAB — LIPASE, BLOOD: Lipase: 22 U/L (ref 11–51)

## 2020-09-02 MED ORDER — IOHEXOL 300 MG/ML  SOLN
100.0000 mL | Freq: Once | INTRAMUSCULAR | Status: AC | PRN
  Administered 2020-09-02: 100 mL via INTRAVENOUS

## 2020-09-02 MED ORDER — SODIUM CHLORIDE 0.9 % IV BOLUS
500.0000 mL | Freq: Once | INTRAVENOUS | Status: AC
  Administered 2020-09-02: 500 mL via INTRAVENOUS

## 2020-09-02 MED ORDER — ONDANSETRON HCL 4 MG/2ML IJ SOLN
4.0000 mg | Freq: Once | INTRAMUSCULAR | Status: AC
  Administered 2020-09-02: 4 mg via INTRAVENOUS
  Filled 2020-09-02: qty 2

## 2020-09-02 MED ORDER — METRONIDAZOLE IN NACL 5-0.79 MG/ML-% IV SOLN
500.0000 mg | Freq: Once | INTRAVENOUS | Status: DC
  Filled 2020-09-02: qty 100

## 2020-09-02 MED ORDER — FENTANYL CITRATE (PF) 100 MCG/2ML IJ SOLN
100.0000 ug | INTRAMUSCULAR | Status: DC | PRN
  Administered 2020-09-02: 100 ug via INTRAVENOUS
  Filled 2020-09-02: qty 2

## 2020-09-02 MED ORDER — CIPROFLOXACIN IN D5W 400 MG/200ML IV SOLN
400.0000 mg | Freq: Once | INTRAVENOUS | Status: AC
  Administered 2020-09-02: 400 mg via INTRAVENOUS
  Filled 2020-09-02: qty 200

## 2020-09-02 NOTE — ED Provider Notes (Addendum)
[facility] DEPT Provider Note   CSN: [id number] Arrival date & time: 09/02/20  1615     History Chief Complaint  Patient presents with  . Abdominal Pain  . Hypertension    Morgan [name] is a 69 y.o. female.  HPI She presents for evaluation of abdominal pain and suspected diverticulitis.  She has been treated by her PCP with Flagyl and Cipro based on findings for early diverticulitis, and a CAT scan done on 08/30/2020.  Patient states her pain is worsened.  She is unable to eat or drink much.  She denies having fever.  She has nausea without vomiting.  She has had diarrhea yesterday but not today.  There is been no blood in the stool.  She is taking oxycodone without relief of her pain.  She has had multiple endoscopies, upper, this year for dilation of the pylorus.  She also had colonoscopy done 2 weeks ago.  She has not had a repeat urinalysis done since she was diagnosed with UTI during recent hospitalization.  No known sick contacts.  She has had her Covid vaccines.  There are no other known modifying factors.    Past Medical History:  Diagnosis Date  . Allergy   . Anemia   . Anxiety   . Arthritis   . CHF (congestive heart failure) ([place])   . Depression   . GERD (gastroesophageal reflux disease)   . Hypertension   . MVP (mitral valve prolapse)     Patient Active Problem List   Diagnosis Date Noted  . Acute diverticulitis 09/02/2020  . Diverticulitis 09/01/2020  . SBO (small bowel obstruction) ([place]) 08/09/2020  . Chronic diastolic CHF (congestive heart failure) ([place]) 08/09/2020  . COPD (chronic obstructive pulmonary disease) ([place]) 08/09/2020  . Chronic respiratory failure with hypoxia ([place]) 08/09/2020  . Polyp of sigmoid colon   . Polyp of cecum   . Gastritis and gastroduodenitis   . Pylorus ulcer   . Pyloric stenosis in adult   . Chronic cholecystitis 05/19/2020  . Oxygen dependent 03/30/2020  . Nausea and vomiting in adult 03/22/2020    . Peptic ulcer disease 03/22/2020  . Iron deficiency anemia 03/22/2020  . Acute pain of right knee 12/29/2019  . Acute left-sided low back pain without sciatica 12/29/2019  . Fall 04/08/2019  . Abdominal pain 02/19/2019  . Viral gastroenteritis 02/19/2019  . Asymptomatic bacteriuria 02/19/2019  . COPD exacerbation ([place]) 02/16/2019  . CKD (chronic kidney disease) stage 3, GFR 30-59 ml/min 01/13/2019  . CHF (congestive heart failure) ([place]) 01/13/2019  . Dizziness 01/13/2019  . Hoarseness 11/12/2018  . Dysphagia 11/12/2018  . Sleepiness 11/12/2018  . CVA (cerebral vascular accident) ([place]) 08/22/2018  . Left-sided weakness 10/04/2017  . HLD (hyperlipidemia) 10/04/2017  . GERD (gastroesophageal reflux disease) 10/04/2017  . Anxiety 08/26/2017  . Essential hypertension   . Depression   . Diarrhea 02/22/2015  . Hypokalemia 02/22/2015  . Chronic lower back pain 04/29/2013  . Paresthesia 04/29/2013    Past Surgical History:  Procedure Laterality Date  . ABDOMINAL HYSTERECTOMY    . APPENDECTOMY    . BALLOON DILATION N/A 04/21/2020   Procedure: BALLOON DILATION;  Surgeon: Milus Banister, MD;  Location: [facility];  Service: Endoscopy;  Laterality: N/A;  pyloric/ GI  . BALLOON DILATION N/A 07/04/2020   Procedure: BALLOON DILATION;  Surgeon: Mauri Pole, MD;  Location: [facility];  Service: Endoscopy;  Laterality: N/A;  . BIOPSY  04/21/2020   Procedure: BIOPSY;  Surgeon:  Milus Banister, MD;  Location: [facility];  Service: Endoscopy;;  . BIOPSY  07/04/2020   Procedure: BIOPSY;  Surgeon: Mauri Pole, MD;  Location: [facility];  Service: Endoscopy;;  EGD and COLON  . BREAST SURGERY Right 1988   tumor removal  . CHOLECYSTECTOMY N/A 05/18/2020   Procedure: LAPAROSCOPIC CHOLECYSTECTOMY;  Surgeon: Johnathan Hausen, MD;  Location: [facility];  Service: General;  Laterality: N/A;  . COLONOSCOPY WITH PROPOFOL N/A 07/04/2020   Procedure: COLONOSCOPY WITH PROPOFOL;  Surgeon:  Mauri Pole, MD;  Location: [facility];  Service: Endoscopy;  Laterality: N/A;  . ESOPHAGEAL DILATION  08/11/2020   Procedure: PYLORIC DILATION;  Surgeon: Doran Stabler, MD;  Location: [facility];  Service: Gastroenterology;;  . ESOPHAGOGASTRODUODENOSCOPY (EGD) WITH PROPOFOL N/A 04/21/2020   Procedure: ESOPHAGOGASTRODUODENOSCOPY (EGD) WITH PROPOFOL;  Surgeon: Milus Banister, MD;  Location: [facility];  Service: Endoscopy;  Laterality: N/A;  . ESOPHAGOGASTRODUODENOSCOPY (EGD) WITH PROPOFOL N/A 07/04/2020   Procedure: ESOPHAGOGASTRODUODENOSCOPY (EGD) WITH PROPOFOL;  Surgeon: Mauri Pole, MD;  Location: [facility];  Service: Endoscopy;  Laterality: N/A;  . ESOPHAGOGASTRODUODENOSCOPY (EGD) WITH PROPOFOL N/A 08/11/2020   Procedure: ESOPHAGOGASTRODUODENOSCOPY (EGD) WITH PROPOFOL;  Surgeon: Doran Stabler, MD;  Location: [facility];  Service: Gastroenterology;  Laterality: N/A;  . KNEE SURGERY  2005   2 times left  . POLYPECTOMY  07/04/2020   Procedure: POLYPECTOMY;  Surgeon: Mauri Pole, MD;  Location: [facility];  Service: Endoscopy;;  . TUBAL LIGATION       OB History   No obstetric history on file.     Family History  Problem Relation Age of Onset  . Breast cancer Mother 81  . Ovarian cancer Mother 59  . Brain cancer Mother   . Hypertension Father   . Hyperlipidemia Father   . Heart disease Father   . Breast cancer Maternal Grandmother 39  . Diabetes Paternal Grandmother   . Breast cancer Paternal Grandmother 24  . Cancer Paternal Grandfather   . Breast cancer Maternal Aunt 60  . Breast cancer Maternal Aunt 29    Social History   Tobacco Use  . Smoking status: Former Smoker    Packs/day: 1.00    Years: 15.00    Pack years: 15.00    Types: Cigarettes    Quit date: 09/12/2017    Years since quitting: 2.9  . Smokeless tobacco: Never Used  Vaping Use  . Vaping Use: Never used  Substance Use Topics  . Alcohol use: Not Currently  .  Drug use: No    Home Medications Prior to Admission medications   Medication Sig Start Date End Date Taking? Authorizing Provider  ARIPiprazole (ABILIFY) 2 MG tablet Take 2 mg by mouth daily. 07/14/20  Yes [provider]  atorvastatin (LIPITOR) 40 MG tablet Take 40 mg by mouth daily.    Yes [provider]  carvedilol (COREG) 3.125 MG tablet Take 3.125 mg by mouth in the morning and at bedtime.   Yes [provider]  ciprofloxacin (CIPRO) 500 MG tablet Take 1 tablet (500 mg total) by mouth 2 (two) times daily. 08/30/20  Yes [name], MD  FLUoxetine (PROZAC) 40 MG capsule Take 40 mg by mouth daily.   Yes [provider]  furosemide (LASIX) 40 MG tablet Take 1 tablet (40 mg total) by mouth daily as needed for fluid or edema. Patient taking differently: Take 40 mg by mouth daily.  08/22/20  Yes Mercy Riding, MD  gabapentin (NEURONTIN) 400 MG capsule Take 400 mg by mouth 3 (three) times daily.  10/20/18  Yes [provider]  hydrOXYzine (ATARAX/VISTARIL) 25 MG tablet Take 12.5-25 mg by mouth as needed for anxiety.    Yes [provider]  Lactobacillus (PROBIOTIC ACIDOPHILUS) CAPS Take 1 tablet by mouth in the morning and at bedtime. Patient taking differently: Take 1 tablet by mouth at bedtime.  08/18/20  Yes Mercy Riding, MD  linaclotide (LINZESS) 145 MCG CAPS capsule Take 1 capsule (145 mcg total) by mouth daily before breakfast. Patient taking differently: Take 145 mcg by mouth as needed (constipation).  05/04/20  Yes Nandigam, Venia Minks, MD  metroNIDAZOLE (FLAGYL) 500 MG tablet Take 1 tablet (500 mg total) by mouth 3 (three) times daily for 10 days. 08/30/20 09/09/20 Yes [name], MD  mirtazapine (REMERON) 45 MG tablet Take 45 mg by mouth at bedtime.    Yes [provider]  omeprazole (PRILOSEC) 40 MG capsule Take 1 capsule (40 mg total) by mouth daily. 08/29/20  Yes [name], MD  ondansetron (ZOFRAN-ODT) 4 MG  disintegrating tablet Take 1 tablet (4 mg total) by mouth every 8 (eight) hours as needed for nausea or vomiting. 08/18/20  Yes Mercy Riding, MD  oxycodone (OXY-IR) 5 MG capsule Take 1 capsule (5 mg total) by mouth every 8 (eight) hours as needed. Patient taking differently: Take 5 mg by mouth every 8 (eight) hours as needed for pain.  08/29/20  Yes [name], MD  Ferrous Sulfate (IRON) 325 (65 Fe) MG TABS Take 1 tablet (325 mg total) by mouth daily. Patient not taking: Reported on 09/02/2020 04/09/19   [name], MD  OXYGEN Inhale into the lungs. 02 at bedtime    [provider]  potassium chloride 20 MEQ TBCR Take 10 mEq by mouth daily. Patient not taking: Reported on 09/02/2020 08/18/20   Mercy Riding, MD  psyllium (METAMUCIL SMOOTH TEXTURE) 58.6 % powder Take 1 packet by mouth 3 (three) times daily. Patient not taking: Reported on 09/02/2020 08/18/20   Mercy Riding, MD  sucralfate (CARAFATE) 1 g tablet TAKE 1 TABLET (1 G TOTAL) BY MOUTH 4 (FOUR) TIMES DAILY - WITH MEALS AND AT BEDTIME. Patient not taking: Reported on 09/02/2020 07/26/20   Mauri Pole, MD    Allergies    Meperidine, Strawberry extract, Strawberry (diagnostic), Wasp venom, and Wasp venom protein  Review of Systems   Review of Systems  All other systems reviewed and are negative.   Physical Exam Updated Vital Signs BP (!) 153/75   Pulse 64   Temp 98.4 F (36.9 C) (Oral)   Resp 20   SpO2 93%   Physical Exam Vitals and nursing note reviewed.  Constitutional:      General: She is in acute distress (She is uncomfortable).     Appearance: She is well-developed. She is obese. She is not ill-appearing, toxic-appearing or diaphoretic.  HENT:     Head: Normocephalic and atraumatic.     Right Ear: External ear normal.     Left Ear: External ear normal.  Eyes:     Conjunctiva/sclera: Conjunctivae normal.     Pupils: Pupils are equal, round, and reactive to light.  Neck:     Trachea: Phonation  normal.  Cardiovascular:     Rate and Rhythm: Normal rate and regular rhythm.     Heart sounds: Normal heart sounds.  Pulmonary:     Effort: Pulmonary effort is  normal.     Breath sounds: Normal breath sounds.  Abdominal:     General: There is no distension.     Palpations: Abdomen is soft. There is no mass.     Tenderness: There is abdominal tenderness (Moderate left upper and lower quadrant tenderness). There is guarding. There is no rebound.     Hernia: No hernia is present.  Musculoskeletal:        General: Normal range of motion.     Cervical back: Normal range of motion and neck supple.  Skin:    General: Skin is warm and dry.  Neurological:     Mental Status: She is alert and oriented to person, place, and time.     Cranial Nerves: No cranial nerve deficit.     Sensory: No sensory deficit.     Motor: No abnormal muscle tone.     Coordination: Coordination normal.  Psychiatric:        Mood and Affect: Mood normal.        Behavior: Behavior normal.        Thought Content: Thought content normal.        Judgment: Judgment normal.     ED Results / Procedures / Treatments   Labs (all labs ordered are listed, but only abnormal results are displayed) Labs Reviewed  COMPREHENSIVE METABOLIC PANEL - Abnormal; Notable for the following components:      Result Value   Potassium 3.3 (*)    Glucose, Bld 107 (*)    Creatinine, Ser 1.01 (*)    Calcium 8.6 (*)    Albumin 3.4 (*)    GFR calc non Af Amer 58 (*)    All other components within normal limits  CBC - Abnormal; Notable for the following components:   Hemoglobin 10.4 (*)    HCT 33.9 (*)    MCV 79.0 (*)    MCH 24.2 (*)    All other components within normal limits  SARS CORONAVIRUS 2 BY RT PCR (HOSPITAL ORDER, [facility])  LIPASE, BLOOD  URINALYSIS, ROUTINE W REFLEX MICROSCOPIC    EKG None  Radiology CT Abdomen Pelvis W Contrast  Result Date: 09/02/2020 CLINICAL DATA:  Left side  abdominal pain, diverticulitis on recent CT. EXAM: CT ABDOMEN AND PELVIS WITH CONTRAST TECHNIQUE: Multidetector CT imaging of the abdomen and pelvis was performed using the standard protocol following bolus administration of intravenous contrast. CONTRAST:  142mL OMNIPAQUE IOHEXOL 300 MG/ML  SOLN COMPARISON:  CT abdomen pelvis 08/30/2020 FINDINGS: Lower chest: Left lower lobe calcified granuloma. Bibasilar linear atelectasis. Otherwise no acute abnormality. Hepatobiliary: No focal liver abnormality is seen. Status post cholecystectomy. Stable trace intrahepatic biliary dilatation and mildly enlarged common bile duct measuring up to 1.1 cm which can be seen in the post cholecystectomy setting. Pancreas: Markedly diffusely atrophic. No pancreatic ductal dilatation or surrounding inflammatory changes. Spleen: Normal in size without focal abnormality. A splenule is identified. Adrenals/Urinary Tract: No adrenal nodule bilaterally. Bilateral kidneys enhance symmetrically. Redemonstration of an extra renal pelvis bilaterally. No hydronephrosis. No nephrolithiasis. No hydroureter. No ureterolithiasis. The urinary bladder is unremarkable. Stomach/Bowel: PO contrast reaches the rectum. No extravasation of PO contrast. Stomach is within normal limits. Status post appendectomy. No evidence of bowel wall thickening, distention, or inflammatory changes. Diffuse sigmoid diverticulosis. Scattered colonic diverticulosis within the remainder of the colon proximally. Vascular/Lymphatic: No abdominal aorta or iliac aneurysm. Mild to moderate atherosclerotic plaque of the aorta and its branches. No abdominal, pelvic, or inguinal lymphadenopathy.  Reproductive: Status post hysterectomy. No adnexal masses. Other: No intraperitoneal free fluid. No intraperitoneal free gas. No organized fluid collection. Similar-appearing nonspecific (possibly postsurgical in the setting of hysterectomy) soft tissue density just medial to the left external  iliac vessels and just lateral to the sigmoid colon appears grossly similar compared to most recent CT as well as similar compared to the CT in 2012. Musculoskeletal: Tiny fat containing umbilical hernia (3:14, 9:702). No acute displaced fracture. No suspicious lytic or blastic osseous lesions. IMPRESSION: 1. Colonic diverticulosis with no acute diverticulitis. 2. Status post cholecystectomy, hysterectomy, appendectomy. 3. Aortic Atherosclerosis (ICD10-I70.0). Electronically Signed   By: Iven Finn M.D.   On: 09/02/2020 22:12    Procedures Procedures (including critical care time)  Medications Ordered in ED Medications  fentaNYL (SUBLIMAZE) injection 100 mcg (100 mcg Intravenous Given 09/02/20 2009)  metroNIDAZOLE (FLAGYL) IVPB 500 mg (has no administration in time range)  sodium chloride 0.9 % bolus 500 mL (0 mLs Intravenous Stopped 09/02/20 2109)  ondansetron (ZOFRAN) injection 4 mg (4 mg Intravenous Given 09/02/20 2009)  ciprofloxacin (CIPRO) IVPB 400 mg (0 mg Intravenous Stopped 09/02/20 2109)  iohexol (OMNIPAQUE) 300 MG/ML solution 100 mL (100 mLs Intravenous Contrast Given 09/02/20 2146)    ED Course  I have reviewed the triage vital signs and the nursing notes.  Pertinent labs & imaging results that were available during my care of the patient were reviewed by me and considered in my medical decision making (see chart for details).  Clinical Course as of Sep 02 2254  Fri Sep 02, 2020  1947 Normal except potassium low, glucose high, creatinine high, calcium low, albumin low, GFR low  Comprehensive metabolic panel(!) [EW]  6378 Normal  Lipase, blood [EW]  1948 Normal set hemoglobin low, MCV low  CBC(!) [EW]    Clinical Course User Index [EW] Daleen Bo, MD   MDM Rules/Calculators/A&P                           Patient Vitals for the past 24 hrs:  BP Temp Temp src Pulse Resp SpO2  09/02/20 2030 (!) 153/75 -- -- 64 20 93 %  09/02/20 2015 (!) 146/66 -- -- 69 -- 97 %   09/02/20 2000 (!) 157/84 -- -- 73 -- 98 %  09/02/20 1945 (!) 170/75 -- -- 69 -- 98 %  09/02/20 1644 (!) 152/81 98.4 F (36.9 C) Oral 82 20 96 %    8:20 PM Reevaluation with update and discussion. After initial assessment and treatment, an updated evaluation reveals she continues to be uncomfortable.  Findings discussed with the patient.  She is agreeable to hospitalization.Daleen Bo   Medical Decision Making:  This patient is presenting for evaluation of diverticulitis, worsening despite antibiotic treatment, which does require a range of treatment options, and is a complaint that involves a high risk of morbidity and mortality. The differential diagnoses include diverticulitis, diverticular abscess, bowel obstruction. I decided to review old records, and in summary patient with recurrent GI problems, now with diverticulitis, failing outpatient treatment with oral antibiotics.  I did not require additional historical information from anyone.  Clinical Laboratory Tests Ordered, included CBC, Metabolic panel, Urinalysis and Lipase. Review indicates hemoglobin low, potassium low, glucose high, creatinine high, calcium low, albumin low. Radiologic Tests Ordered, included CT abdomen pelvis.  I independently Visualized: Radiographic images, pending at the time of admission    Critical Interventions-medical evaluation, narcotic analgesia, laboratory testing, CT imaging, observation  reassessment  After These Interventions, the Patient was reevaluated and was found still uncomfortable requiring hospitalization for further treatment with parenteral medications and IV fluids.  Repeat CT scan ordered  CRITICAL CARE-no Performed by: Daleen Bo  Nursing Notes Reviewed/ Care Coordinated Applicable Imaging Reviewed Interpretation of Laboratory Data incorporated into ED treatment   8:15 PM-Consult complete with hospitalist. Patient case explained and discussed. He agrees to admit patient  for further evaluation and treatment. Call ended at 8:20 PM  10:55 PM-hospitalist requested a CT scan of the abdomen pelvis, prior to seeing the patient.  CT scan returned showing normal colon, without diverticulitis.  At this time the patient states she is comfortable going home.  Findings discussed with the patient and all questions were answered  Final Clinical Impression(s) / ED Diagnoses Final diagnoses:  Abdominal pain, unspecified abdominal location    Rx / DC Orders ED Discharge Orders    None          Daleen Bo, MD 09/02/20 2259

## 2020-09-02 NOTE — Patient Instructions (Signed)
Go to the ER at [facility].  I called ahead.  Presumed diverticulitis.   Take care.  Glad to see you.

## 2020-09-02 NOTE — Progress Notes (Signed)
This visit occurred during the SARS-CoV-2 public health emergency.  Safety protocols were in place, including screening questions prior to the visit, additional usage of staff PPE, and extensive cleaning of exam room while observing appropriate contact time as indicated for disinfecting solutions.  Abd pain follow up.  More nausea and pain.  She is clearly worse than yesterday.  abd pain noted in spite of oxycodone.  Having diarrhea.  No vomiting.  No fevers.  Today is day #3 of abx.  Burning with urination, over the last 2 days.  Brownish urine.    Prev CT suggestive of diverticulitis.    Meds, vitals, and allergies reviewed.   ROS: Per HPI unless specifically indicated in ROS section   nad but uncomfortable from abd pain.  ncat Neck supple, no LA rrr ctab abd soft, ttp on the L side of the abd.  Ext with 1+ BLE edema.  Skin well perfused.   Recent labs d/w pt.

## 2020-09-02 NOTE — ED Notes (Signed)
Pt ambulatory to RR w/out assistance.

## 2020-09-02 NOTE — ED Notes (Signed)
Per PCP-CT scan shows diverticulitis-history of the same-left abdominal pain

## 2020-09-02 NOTE — Discharge Instructions (Signed)
The CT scan did not show diverticulitis or any complications in the abdomen.  It is safe to start taking the antibiotic medications at this time.  Try to avoid using the narcotic pain reliever and instead use Tylenol for pain.  Consider trying a stool softener such as Colace, twice a day to ensure normal daily bowel movements.  Follow-up with your doctor for checkup, next week.  Return here if needed for severe pain.

## 2020-09-02 NOTE — ED Notes (Signed)
Patient transported to CT 

## 2020-09-02 NOTE — ED Triage Notes (Signed)
Patient states she saw her PCP today. pataent reports that she has diverticulitis and was told to come to the ED to be admitted for IV antibiotics. patient also reports hypertension. BP-152/81 in triage.

## 2020-09-03 LAB — SARS CORONAVIRUS 2 BY RT PCR (HOSPITAL ORDER, PERFORMED IN ~~LOC~~ HOSPITAL LAB): SARS Coronavirus 2: NEGATIVE

## 2020-09-04 NOTE — Assessment & Plan Note (Signed)
Worsening abdominal pain, presumed from diverticulitis, failed outpatient treatment.  Discussed options.  Reasonable to go to the emergency room.  She can drive short distance to her home and then get a driver to take her to [facility] emergency room.  I called ahead to the charge nurse/triage about the incoming ambulatory patient.  Routine cautions given.  Patient agreed with plan.  Routed to PCP as FYI.

## 2020-09-07 ENCOUNTER — Other Ambulatory Visit: Payer: Self-pay | Admitting: Gastroenterology

## 2020-09-29 ENCOUNTER — Encounter: Payer: Self-pay | Admitting: Gastroenterology

## 2020-09-29 ENCOUNTER — Telehealth: Payer: Self-pay | Admitting: Family Medicine

## 2020-09-29 ENCOUNTER — Ambulatory Visit: Payer: Medicare HMO | Admitting: Gastroenterology

## 2020-09-29 VITALS — BP 124/70 | HR 67 | Ht 73.0 in | Wt 235.2 lb

## 2020-09-29 DIAGNOSIS — K219 Gastro-esophageal reflux disease without esophagitis: Secondary | ICD-10-CM

## 2020-09-29 DIAGNOSIS — R109 Unspecified abdominal pain: Secondary | ICD-10-CM | POA: Diagnosis not present

## 2020-09-29 DIAGNOSIS — T402X5A Adverse effect of other opioids, initial encounter: Secondary | ICD-10-CM

## 2020-09-29 DIAGNOSIS — R1030 Lower abdominal pain, unspecified: Secondary | ICD-10-CM

## 2020-09-29 DIAGNOSIS — K311 Adult hypertrophic pyloric stenosis: Secondary | ICD-10-CM | POA: Diagnosis not present

## 2020-09-29 DIAGNOSIS — K5903 Drug induced constipation: Secondary | ICD-10-CM | POA: Diagnosis not present

## 2020-09-29 MED ORDER — NALOXEGOL OXALATE 25 MG PO TABS: 25.0000 mg | ORAL_TABLET | Freq: Every day | ORAL | 3 refills | Status: DC

## 2020-09-29 MED ORDER — OXYCODONE HCL 5 MG PO CAPS: 5.0000 mg | ORAL_CAPSULE | Freq: Three times a day (TID) | ORAL | 0 refills | Status: DC | PRN

## 2020-09-29 NOTE — Addendum Note (Signed)
Addended by: [name] R on: 09/29/2020 02:05 PM   Modules accepted: Orders

## 2020-09-29 NOTE — Telephone Encounter (Signed)
Please check in with patient regarding pain.   Is she working to reduce use of oxycodone? She saw GI today, do they think it is OK for her to continue narcotics? It can often worsening stomach issues.

## 2020-09-29 NOTE — Telephone Encounter (Signed)
Spoke with patient. Still with severe pain. Following with GI and getting additional treatment support and work-up. Doing ok with one oxycodone per day.   Discussed only taking if severe pain and to return in person before next refill

## 2020-09-29 NOTE — Progress Notes (Signed)
Morgan [name]    [id number]    May 31, 1951  Primary Care Physician:Cody, Jobe Marker, MD  Referring Physician: [name], MD [place],  [place] [address]   Chief complaint: Abdominal pain, GERD, dysphagia  HPI:  69 year old very pleasant female here for follow-up visit for abdominal pain, dysphagia, constipation and GERD  She was recently in the ER last month, prior to that 4 other admissions within this calendar year  Continues to have left-sided abdominal pain. She was empirically treated with Cipro Flagyl for possible mild diverticulitis.  She has irregular bowel habits with alternating constipation diarrhea, abdominal bloating, nausea, regurgitation and abdominal fullness  CT abdomen and pelvis 09/02/20: Colonic diverticulosis with no acute diverticulitis  PREVIOUS ENDOSCOPIC EVALUATIONS / GI STUDIES :  8/224/21 CT scan  abd/ pelvis with contrast -Few mildly dilated loops of small bowel in the mid abdomen with air-fluid levels suggesting low-grade obstruction.  07/04/20 EGD LA Grade B reflux esophagitis with no bleeding. - Small hiatal hernia. - Gastritis with hemorrhage. Biopsied. - Non-bleeding gastric ulcer with a clean ulcer base (Forrest Class III). Biopsied. - Gastric stenosis was found at the pylorus. Dilated. - Rule out malignancy, duodenal mass. Biopsied. - Normal second portion of the duodenum.  07/04/20 Colonoscopy Three 1 to 2 mm polyps in the sigmoid colon and in the cecum, removed with a cold biopsy forceps. Resected and retrieved. - Two 4 to 6 mm polyps in the sigmoid colon, removed with a cold snare. Resected and 1 of the 2 polyps retrieved. - Moderate diverticulosis in the sigmoid colon, in the descending colon and in the ascending colon. There was evidence of an impacted diverticulum. - Non-bleeding internal hemorrhoids.  FINAL MICROSCOPIC DIAGNOSIS:   A. PYLORIC CHANNEL, BIOPSY:  - Gastric antral mucosa with  nonspecific reactive gastropathy  - Focal intestinal metaplasia, negative for dysplasia or carcinoma   B. GASTRIC, RANDOM, BIOPSY:  - Gastric antral and oxyntic mucosa with nonspecific reactive  gastropathy  - Warthin Starry stain is negative for Helicobacter pylori   C. CECAL POLYP, SIGMOID COLON POLYPS, POLYPECTOMY:  - Tubular adenoma without high-grade dysplasia or malignancy  - Hyperplastic polyp(s)   04/21/20 EGD Normal GE junction. - Chronic, benign appearing pyloric stenosis. Biopsied and dilated to 71mm with CRE balloon. - Mild, non-specific gastritis was biopsied to check for H. pylori.  Outpatient Encounter Medications as of 09/29/2020  Medication Sig   ARIPiprazole (ABILIFY) 2 MG tablet Take 2 mg by mouth daily.   atorvastatin (LIPITOR) 40 MG tablet Take 40 mg by mouth daily.    carvedilol (COREG) 3.125 MG tablet Take 3.125 mg by mouth in the morning and at bedtime.   ciprofloxacin (CIPRO) 500 MG tablet Take 1 tablet (500 mg total) by mouth 2 (two) times daily.   Ferrous Sulfate (IRON) 325 (65 Fe) MG TABS Take 1 tablet (325 mg total) by mouth daily. (Patient not taking: Reported on 09/02/2020)   FLUoxetine (PROZAC) 40 MG capsule Take 40 mg by mouth daily.   furosemide (LASIX) 40 MG tablet Take 1 tablet (40 mg total) by mouth daily as needed for fluid or edema. (Patient taking differently: Take 40 mg by mouth daily. )   gabapentin (NEURONTIN) 400 MG capsule Take 400 mg by mouth 3 (three) times daily.    hydrOXYzine (ATARAX/VISTARIL) 25 MG tablet Take 12.5-25 mg by mouth as needed for anxiety.  Lactobacillus (PROBIOTIC ACIDOPHILUS) CAPS Take 1 tablet by mouth in the morning and at bedtime. (Patient taking differently: Take 1 tablet by mouth at bedtime. )   LINZESS 145 MCG CAPS capsule TAKE 1 CAPSULE (145 MCG TOTAL) BY MOUTH DAILY BEFORE BREAKFAST.   mirtazapine (REMERON) 45 MG tablet Take 45 mg by mouth at bedtime.    omeprazole (PRILOSEC) 40 MG capsule Take 1  capsule (40 mg total) by mouth daily.   ondansetron (ZOFRAN-ODT) 4 MG disintegrating tablet Take 1 tablet (4 mg total) by mouth every 8 (eight) hours as needed for nausea or vomiting.   oxycodone (OXY-IR) 5 MG capsule Take 1 capsule (5 mg total) by mouth every 8 (eight) hours as needed. (Patient taking differently: Take 5 mg by mouth every 8 (eight) hours as needed for pain. )   OXYGEN Inhale into the lungs. 02 at bedtime   potassium chloride 20 MEQ TBCR Take 10 mEq by mouth daily. (Patient not taking: Reported on 09/02/2020)   psyllium (METAMUCIL SMOOTH TEXTURE) 58.6 % powder Take 1 packet by mouth 3 (three) times daily. (Patient not taking: Reported on 09/02/2020)   sucralfate (CARAFATE) 1 g tablet TAKE 1 TABLET (1 G TOTAL) BY MOUTH 4 (FOUR) TIMES DAILY - WITH MEALS AND AT BEDTIME. (Patient not taking: Reported on 09/02/2020)   No facility-administered encounter medications on file as of 09/29/2020.    Allergies as of 09/29/2020 - Review Complete 09/02/2020  Allergen Reaction Noted   Meperidine Hives and Anaphylaxis 04/28/2013   Strawberry extract Swelling 08/09/2020   Strawberry (diagnostic) Hives 08/26/2017   Wasp venom Hives 04/11/2020   Wasp venom protein  08/09/2020    Past Medical History:  Diagnosis Date   Allergy    Anemia    Anxiety    Arthritis    CHF (congestive heart failure) (HCC)    Depression    GERD (gastroesophageal reflux disease)    Hypertension    MVP (mitral valve prolapse)     Past Surgical History:  Procedure Laterality Date   ABDOMINAL HYSTERECTOMY     APPENDECTOMY     BALLOON DILATION N/A 04/21/2020   Procedure: BALLOON DILATION;  Surgeon: Milus Banister, MD;  Location: [facility];  Service: Endoscopy;  Laterality: N/A;  pyloric/ GI   BALLOON DILATION N/A 07/04/2020   Procedure: BALLOON DILATION;  Surgeon: Mauri Pole, MD;  Location: [facility];  Service: Endoscopy;  Laterality: N/A;   BIOPSY  04/21/2020   Procedure:  BIOPSY;  Surgeon: Milus Banister, MD;  Location: [facility];  Service: Endoscopy;;   BIOPSY  07/04/2020   Procedure: BIOPSY;  Surgeon: Mauri Pole, MD;  Location: [facility];  Service: Endoscopy;;  EGD and COLON   BREAST SURGERY Right 1988   tumor removal   CHOLECYSTECTOMY N/A 05/18/2020   Procedure: LAPAROSCOPIC CHOLECYSTECTOMY;  Surgeon: Johnathan Hausen, MD;  Location: [facility];  Service: General;  Laterality: N/A;   COLONOSCOPY WITH PROPOFOL N/A 07/04/2020   Procedure: COLONOSCOPY WITH PROPOFOL;  Surgeon: Mauri Pole, MD;  Location: [facility];  Service: Endoscopy;  Laterality: N/A;   ESOPHAGEAL DILATION  08/11/2020   Procedure: PYLORIC DILATION;  Surgeon: Doran Stabler, MD;  Location: [facility];  Service: Gastroenterology;;   ESOPHAGOGASTRODUODENOSCOPY (EGD) WITH PROPOFOL N/A 04/21/2020   Procedure: ESOPHAGOGASTRODUODENOSCOPY (EGD) WITH PROPOFOL;  Surgeon: Milus Banister, MD;  Location: [facility];  Service: Endoscopy;  Laterality: N/A;   ESOPHAGOGASTRODUODENOSCOPY (EGD) WITH PROPOFOL N/A 07/04/2020   Procedure: ESOPHAGOGASTRODUODENOSCOPY (EGD) WITH PROPOFOL;  Surgeon: Mauri Pole, MD;  Location: [facility];  Service: Endoscopy;  Laterality: N/A;   ESOPHAGOGASTRODUODENOSCOPY (EGD) WITH PROPOFOL N/A 08/11/2020   Procedure: ESOPHAGOGASTRODUODENOSCOPY (EGD) WITH PROPOFOL;  Surgeon: Doran Stabler, MD;  Location: [facility];  Service: Gastroenterology;  Laterality: N/A;   KNEE SURGERY  2005   2 times left   POLYPECTOMY  07/04/2020   Procedure: POLYPECTOMY;  Surgeon: Mauri Pole, MD;  Location: [facility];  Service: Endoscopy;;   TUBAL LIGATION      Family History  Problem Relation Age of Onset   Breast cancer Mother 30   Ovarian cancer Mother 81   Brain cancer Mother    Hypertension Father    Hyperlipidemia Father    Heart disease Father    Breast cancer Maternal Grandmother 94   Diabetes Paternal Grandmother     Breast cancer Paternal Grandmother 57   Cancer Paternal Grandfather    Breast cancer Maternal Aunt 60   Breast cancer Maternal Aunt 60    Social History   Socioeconomic History   Marital status: Single    Spouse name: Not on file   Number of children: 2   Years of education: Not on file   Highest education level: Not on file  Occupational History   Not on file  Tobacco Use   Smoking status: Former Smoker    Packs/day: 1.00    Years: 15.00    Pack years: 15.00    Types: Cigarettes    Quit date: 09/12/2017    Years since quitting: 3.0   Smokeless tobacco: Never Used  Vaping Use   Vaping Use: Never used  Substance and Sexual Activity   Alcohol use: Not Currently   Drug use: No   Sexual activity: Not Currently  Other Topics Concern   Not on file  Social History Narrative   Lives with Grandson's great grandparents    44 - 68 years old ([place])   Daughter - [name] - lives in the [place] system - family, aunt, [name] and [name] (who she lives with), brother   Transportation: roommates   Enjoy: [employer], playing with grandson   Exercise: PT exercises   Diet: good - chicken, some steak, tries to eat healthy, avoids fried foods   Retired from being a Quarry manager   Social Determinants of Radio broadcast assistant Strain:    Difficulty of Paying Living Expenses: Not on file  Food Insecurity:    Worried About Charity fundraiser in the Last Year: Not on file   Yellow Medicine in the Last Year: Not on file  Transportation Needs:    Lack of Transportation (Medical): Not on file   Lack of Transportation (Non-Medical): Not on file  Physical Activity:    Days of Exercise per Week: Not on file   Minutes of Exercise per Session: Not on file  Stress:    Feeling of Stress : Not on file  Social Connections:    Frequency of Communication with Friends and Family: Not on file   Frequency of Social Gatherings with Friends and Family: Not on  file   Attends Religious Services: Not on file   Active Member of Clubs or Organizations: Not on file   Attends Archivist Meetings: Not on file   Marital Status: Not on file  Intimate Partner Violence:    Fear of Current or Ex-Partner: Not on file   Emotionally Abused: Not on file   Physically  Abused: Not on file   Sexually Abused: Not on file      Review of systems: All other review of systems negative except as mentioned in the HPI.   Physical Exam: Vitals:   09/29/20 1045  BP: 124/70  Pulse: 67  SpO2: 98%   Body mass index is 31.03 kg/m. Gen:      No acute distress HEENT:  sclera anicteric Abd:      soft, non-tender; no palpable masses, no distension Ext:    No edema Neuro: alert and oriented x 3 Psych: normal mood and affect  Data Reviewed:  Reviewed labs, radiology imaging, old records and pertinent past GI work up   Assessment and Plan/Recommendations:  69 year old very pleasant female with history of peptic ulcer disease, pyloric stenosis s/p multiple EGD with dilation, CHF, COPD on oxygen, CKD, appendectomy hysterectomy and cholecystectomy with chronic iron deficiency anemia on chronic opiates with chronic GERD and constipation  Left side abdominal pain: Persistent, unclear etiology with no acute pathology noted on recent CT abdomen and pelvis  We will obtain upper GI series with small bowel follow-through  GERD: Continue PPI and antireflux measures  Nausea: Unclear could be secondary to adverse reaction to medications/opiates Use Zofran as needed for severe symptoms  Dysphagia unclear etiology no mechanical stricture or esophagitis on recent EGD could be secondary to GERD.  Will consider esophageal manometry if continues to have persistent symptoms and if has no abnormality on barium study  Pyloric stenosis s/p multiple EGDs with dilation, will evaluate on upper GI series flow through the pyloric channel and exclude any gastric outlet  obstruction  Anemia of chronic disease and iron deficiency Secondary to chronic occult GI blood loss with erosive gastritis and peptic ulcer disease She is up-to-date with colorectal cancer screening  Chronic constipation opiate induced: We will do a trial of Movantik 25 mg daily Increase dietary fiber and water intake Start Metamucil, will switch to soluble fiber Benefiber 1 tablespoon 3 times daily with meals Discontinue Linzess  This visit required 40 minutes of patient care (this includes precharting, chart review, review of results, face-to-face time used for counseling as well as treatment plan and follow-up. The patient was provided an opportunity to ask questions and all were answered. The patient agreed with the plan and demonstrated an understanding of the instructions.  Damaris Hippo , MD    CC: [name], MD

## 2020-09-29 NOTE — Telephone Encounter (Signed)
Medication Refill - Medication:  oxycodone (OXY-IR) 5mg   Has the patient contacted their pharmacy?  No controlled substance.  Preferred Pharmacy (with phone number or street name):  [id number] - [place], [place] Phone:  [phone]  Fax:  [phone]

## 2020-09-29 NOTE — Patient Instructions (Signed)
You have been scheduled for an Upper GI Series and Small Bowel Follow Thru at [facility] Your appointment is on 10/17/2020  at 11am. Please arrive 15 minutes prior to your test for registration. Make certain not to have anything to eat or drink after midnight on the night before your test. If you need to reschedule, please contact radiology at [phone]. --------------------------------------------------------------------------------------------------------------- An upper GI series uses x rays to help diagnose problems of the upper GI tract, which includes the esophagus, stomach, and duodenum. The duodenum is the first part of the small intestine. An upper GI series is conducted by a radiology technologist or a radiologist--a doctor who specializes in x-ray imaging--at a hospital or outpatient center. While sitting or standing in front of an x-ray machine, the patient drinks barium liquid, which is often white and has a chalky consistency and taste. The barium liquid coats the lining of the upper GI tract and makes signs of disease show up more clearly on x rays. X-ray video, called fluoroscopy, is used to view the barium liquid moving through the esophagus, stomach, and duodenum. Additional x rays and fluoroscopy are performed while the patient lies on an x-ray table. To fully coat the upper GI tract with barium liquid, the technologist or radiologist may press on the abdomen or ask the patient to change position. Patients hold still in various positions, allowing the technologist or radiologist to take x rays of the upper GI tract at different angles. If a technologist conducts the upper GI series, a radiologist will later examine the images to look for problems.  This test typically takes about 1 hour to complete --------------------------------------------------------------------------------------------------------------------------------------------- The Small Bowel Follow Thru  examination is used to visualize the entire small bowel (intestines); specifically the connection between the small and large intestine. You will be positioned on a flat x-ray table and an image of your abdomen taken. Then the technologist will show the x-ray to the radiologist. The radiologist will instruct your technologist how much (1-2 cups) barium sulfate you will drink and when to begin taking the timed x-rays, usually 15-30 minutes after you begin drinking. Barium is a harmless substance that will highlight your small intestine by absorbing x-ray. The taste is chalky and it feels very heavy both in the cup and in your stomach.  After the first x-ray is taken and shown to the radiologist, he/she will determine when the next image is to be taken. This is repeated until the barium has reached the end of the small intestine and enters the beginning of the colon (cecum). At such time when the barium spills into the colon, you will be positioned on the x-ray table once again. The radiologist will use a fluoroscopic camera to take some detailed pictures of the connection between your small intestine and colon. The fluoroscope is an x-ray unit that works with a television/computer screen. The radiologist will apply pressure to your abdomen with his/her hand and a lead glove, a plastic paddle, or a paddle with an inflated rubber balloon on the end. This is to spread apart your loops of intestine so he/she can see all areas.   This test typically takes around 1 hour to complete.   Drink plenty of water (8-10 cups/day) for a few days following the procedure to avoid constipation and blockage. The barium will make your stools white for a few days. --------------------------------------------------------------------------------------------------------------------------------------------  Discontinue Linzess and Metamucil   We will refill your zofran and send movantik to your pharmacy today  Take benefiber 1  tablespoon three times a day with meals  Increase water intake daily to 8-10 cups per day   Constipation, Adult Constipation is when a person:  Poops (has a bowel movement) fewer times in a week than normal.  Has a hard time pooping.  Has poop that is dry, hard, or bigger than normal. Follow these instructions at home: Eating and drinking   Eat foods that have a lot of fiber, such as: ? Fresh fruits and vegetables. ? Whole grains. ? Beans.  Eat less of foods that are high in fat, low in fiber, or overly processed, such as: ? [place] fries. ? Hamburgers. ? Cookies. ? Candy. ? Soda.  Drink enough fluid to keep your pee (urine) clear or pale yellow. General instructions  Exercise regularly or as told by your doctor.  Go to the restroom when you feel like you need to poop. Do not hold it in.  Take over-the-counter and prescription medicines only as told by your doctor. These include any fiber supplements.  Do pelvic floor retraining exercises, such as: ? Doing deep breathing while relaxing your lower belly (abdomen). ? Relaxing your pelvic floor while pooping.  Watch your condition for any changes.  Keep all follow-up visits as told by your doctor. This is important. Contact a doctor if:  You have pain that gets worse.  You have a fever.  You have not pooped for 4 days.  You throw up (vomit).  You are not hungry.  You lose weight.  You are bleeding from the anus.  You have thin, pencil-like poop (stool). Get help right away if:  You have a fever, and your symptoms suddenly get worse.  You leak poop or have blood in your poop.  Your belly feels hard or bigger than normal (is bloated).  You have very bad belly pain.  You feel dizzy or you faint. This information is not intended to replace advice given to you by your health care provider. Make sure you discuss any questions you have with your health care provider. Document Revised: 11/15/2017 Document  Reviewed: 05/23/2016 Elsevier Patient Education  [employer].  Due to recent changes in healthcare laws, you may see the results of your imaging and laboratory studies on MyChart before your provider has had a chance to review them.  We understand that in some cases there may be results that are confusing or concerning to you. Not all laboratory results come back in the same time frame and the provider may be waiting for multiple results in order to interpret others.  Please give [place] 48 hours in order for your provider to thoroughly review all the results before contacting the office for clarification of your results.   I appreciate the  opportunity to care for you  Thank You   Harl Bowie , MD

## 2020-09-30 ENCOUNTER — Other Ambulatory Visit: Payer: Self-pay | Admitting: Nurse Practitioner

## 2020-09-30 ENCOUNTER — Other Ambulatory Visit: Payer: Self-pay

## 2020-09-30 MED ORDER — ONDANSETRON 4 MG PO TBDP: 4.0000 mg | ORAL_TABLET | Freq: Three times a day (TID) | ORAL | 0 refills | Status: DC | PRN

## 2020-09-30 NOTE — Telephone Encounter (Signed)
Pt called requesting refill of Zofran last filled by GI 08/18/2020... pt states she call Dr Lorie Apley office and they are closed and she is unable to request for refill... pt is aware that Dr Einar Pheasant is out of the office and may not be able to get to message before the end of the day... pt expressed understanding...Marland Kitchen please advise

## 2020-10-03 MED ORDER — ONDANSETRON 4 MG PO TBDP: 4.0000 mg | ORAL_TABLET | Freq: Three times a day (TID) | ORAL | 0 refills | Status: DC | PRN

## 2020-10-03 NOTE — Addendum Note (Signed)
Addended by: Julieanne Cotton K on: 10/03/2020 11:45 AM   Modules accepted: Orders

## 2020-10-17 ENCOUNTER — Ambulatory Visit (HOSPITAL_COMMUNITY): Admission: RE | Admit: 2020-10-17 | Payer: Medicare HMO | Source: Ambulatory Visit

## 2020-10-20 ENCOUNTER — Ambulatory Visit (INDEPENDENT_AMBULATORY_CARE_PROVIDER_SITE_OTHER): Payer: Medicare HMO | Admitting: Family Medicine

## 2020-10-20 ENCOUNTER — Other Ambulatory Visit: Payer: Self-pay

## 2020-10-20 ENCOUNTER — Telehealth: Payer: Self-pay

## 2020-10-20 ENCOUNTER — Telehealth: Payer: Self-pay | Admitting: Gastroenterology

## 2020-10-20 VITALS — BP 128/78 | HR 73 | Temp 98.1°F | Wt 230.8 lb

## 2020-10-20 DIAGNOSIS — K5792 Diverticulitis of intestine, part unspecified, without perforation or abscess without bleeding: Secondary | ICD-10-CM | POA: Diagnosis not present

## 2020-10-20 DIAGNOSIS — I10 Essential (primary) hypertension: Secondary | ICD-10-CM | POA: Diagnosis not present

## 2020-10-20 DIAGNOSIS — R1030 Lower abdominal pain, unspecified: Secondary | ICD-10-CM | POA: Diagnosis not present

## 2020-10-20 MED ORDER — OXYCODONE HCL 5 MG PO CAPS: 5.0000 mg | ORAL_CAPSULE | Freq: Three times a day (TID) | ORAL | 0 refills | Status: DC | PRN

## 2020-10-20 MED ORDER — CIPROFLOXACIN HCL 500 MG PO TABS: 500.0000 mg | ORAL_TABLET | Freq: Two times a day (BID) | ORAL | 0 refills | Status: DC

## 2020-10-20 MED ORDER — CIPROFLOXACIN HCL 500 MG PO TABS: 500.0000 mg | ORAL_TABLET | Freq: Two times a day (BID) | ORAL | 0 refills | Status: AC

## 2020-10-20 MED ORDER — LEVOFLOXACIN 750 MG PO TABS: 750.0000 mg | ORAL_TABLET | Freq: Every day | ORAL | 0 refills | Status: AC

## 2020-10-20 MED ORDER — METRONIDAZOLE 500 MG PO TABS: 500.0000 mg | ORAL_TABLET | Freq: Three times a day (TID) | ORAL | 0 refills | Status: DC

## 2020-10-20 NOTE — Telephone Encounter (Signed)
Pt left a message on triage line stating she has left a message for Dr Silverio Decamp (GI) because she is in more pain than usual and did not think that Dr Einar Pheasant could or would be able to send her anything in for it, but if she could please send it in to [place].

## 2020-10-20 NOTE — Progress Notes (Addendum)
Subjective:     Morgan [name] is a 69 y.o. female presenting for Diverticulitis (nausea, diarrhea, abdominal pain x 2 days )     HPI    #Lower abdominal pain - has been following her diet - nausea and diarrhea over the last 2 days - got worse of the last 2 days - last night on the way to church - took 3 nausea pills w/in 2 hours - up all night - left upper quadrant pain - had been improving - the pain is causing the nausea to be worse  Following with Dr. Silverio Decamp w/ GI - no clear cause for her GI symptoms   Review of Systems   Social History   Tobacco Use  Smoking Status Former Smoker  . Packs/day: 1.00  . Years: 15.00  . Pack years: 15.00  . Types: Cigarettes  . Quit date: 09/12/2017  . Years since quitting: 3.1  Smokeless Tobacco Never Used        Objective:    BP Readings from Last 3 Encounters:  10/20/20 128/78  09/29/20 124/70  09/02/20 (!) 150/79   Wt Readings from Last 3 Encounters:  10/20/20 230 lb 12 oz (104.7 kg)  09/29/20 235 lb 3.2 oz (106.7 kg)  09/02/20 236 lb 3 oz (107.1 kg)    BP 128/78   Pulse 73   Temp 98.1 F (36.7 C) (Temporal)   Wt 230 lb 12 oz (104.7 kg)   SpO2 97%   BMI 30.44 kg/m    Physical Exam Constitutional:      General: She is not in acute distress.    Appearance: She is well-developed. She is not diaphoretic.  HENT:     Right Ear: External ear normal.     Left Ear: External ear normal.     Nose: Nose normal.  Eyes:     Conjunctiva/sclera: Conjunctivae normal.  Cardiovascular:     Rate and Rhythm: Normal rate and regular rhythm.     Heart sounds: No murmur heard.   Pulmonary:     Effort: Pulmonary effort is normal. No respiratory distress.     Breath sounds: Normal breath sounds. No wheezing.  Abdominal:     General: Abdomen is protuberant. Bowel sounds are normal. There is distension.     Palpations: Abdomen is soft.     Tenderness: There is abdominal tenderness in the epigastric area, left upper  quadrant and left lower quadrant. There is guarding. There is no rebound.  Musculoskeletal:     Cervical back: Neck supple.  Skin:    General: Skin is warm and dry.     Capillary Refill: Capillary refill takes less than 2 seconds.  Neurological:     Mental Status: She is alert. Mental status is at baseline.  Psychiatric:        Mood and Affect: Mood normal.        Behavior: Behavior normal.           Assessment & Plan:   Problem List Items Addressed This Visit      Cardiovascular and Mediastinum   Essential hypertension    BP at goal. Cont carvedilol 3.125 mg BID        Digestive   Acute diverticulitis - Primary    Acute worsening of abdominal pain. Will treat with abx. Opiates for severe pain.       Relevant Medications   metroNIDAZOLE (FLAGYL) 500 MG tablet   oxycodone (OXY-IR) 5 MG capsule   levofloxacin (LEVAQUIN) 750  MG tablet   Other Relevant Orders   Comprehensive metabolic panel   CBC with Differential     Other   Abdominal pain    Reports some improvement in pain since 6 weeks ago, however, worsening over the last 2 days. Pt with chronic abdominal pain and hx of diverticulosis. She is most tender in LUQ, however, given hx discussed option of CT scan now vs trial of abx and pain management as prior CT scans w/o acute findings. Will treat for diverticulitis. Strict ER and return precautions discussed. If worsening tomorrow or no improvement would recommend CT given acute worsening of pain. Labs today to help guide work-up. She follows with GI and appreciate their support -is waiting for some additional work-up. Cont nausea management. She declined C diff testing but if worsening diarrhea would recommend testing.       Relevant Medications   oxycodone (OXY-IR) 5 MG capsule   Other Relevant Orders   Comprehensive metabolic panel   CBC with Differential     [facility]ipro not available at [facility]. Discussed with patient and sent alternative but if very expensive -- cipro  sent to [facility] to fill.   Return in about 4 days (around 10/24/2020).  [name], MD  This visit occurred during the SARS-CoV-2 public health emergency.  Safety protocols were in place, including screening questions prior to the visit, additional usage of staff PPE, and extensive cleaning of exam room while observing appropriate contact time as indicated for disinfecting solutions.

## 2020-10-20 NOTE — Patient Instructions (Signed)
Take antibiotics  If worsening abdominal pain, nausea/vomiting, diarrhea, fevers or chills over the weekend go to the ER  If not improved, return on Monday

## 2020-10-20 NOTE — Assessment & Plan Note (Signed)
Acute worsening of abdominal pain. Will treat with abx. Opiates for severe pain.

## 2020-10-20 NOTE — Telephone Encounter (Signed)
Please see if pt can come in for add-on visit at 2:40 pm today.

## 2020-10-20 NOTE — Assessment & Plan Note (Addendum)
Reports some improvement in pain since 6 weeks ago, however, worsening over the last 2 days. Pt with chronic abdominal pain and hx of diverticulosis. She is most tender in LUQ, however, given hx discussed option of CT scan now vs trial of abx and pain management as prior CT scans w/o acute findings. Will treat for diverticulitis. Strict ER and return precautions discussed. If worsening tomorrow or no improvement would recommend CT given acute worsening of pain. Labs today to help guide work-up. She follows with GI and appreciate their support -is waiting for some additional work-up. Cont nausea management. She declined C diff testing but if worsening diarrhea would recommend testing.

## 2020-10-20 NOTE — Telephone Encounter (Signed)
Can triage return her call to get more information regarding her symptoms and see if she would benefit from evaluation.   Agree with reaching out to GI as she has done.

## 2020-10-20 NOTE — Assessment & Plan Note (Signed)
BP at goal. Cont carvedilol 3.125 mg BID

## 2020-10-20 NOTE — Telephone Encounter (Signed)
Pt has been scheduled to come in today at 2:40.

## 2020-10-20 NOTE — Telephone Encounter (Addendum)
Tried to call pt. No answer. Advised to call and speak to someone in triage. Will send to [facility] also just in case she calls triage back and I am not available as I will be with Dr Silvio Pate at 130.

## 2020-10-20 NOTE — Addendum Note (Signed)
Addended by: [name] on: 10/20/2020 05:31 PM   Modules accepted: Orders

## 2020-10-20 NOTE — Telephone Encounter (Signed)
Pt is having diarrhea, nausea and severe abdominal pain from diverticulosis/diverticulitis as she has been having the last several months. Has been seen several times The GI office does not write pain meds for her so they told her to call PCP to do it. She said had a rx for something and threw them away because she did not think she would need them. She uses [facility].

## 2020-10-20 NOTE — Telephone Encounter (Signed)
Called the patient back. No answer. Left a message of the return call. Records indicate the patient was successful in being seen for the symptoms by her PCP. Diagnosis is acute diverticulitis.

## 2020-10-21 LAB — COMPREHENSIVE METABOLIC PANEL
ALT: 11 U/L (ref 0–35)
AST: 18 U/L (ref 0–37)
Albumin: 3.7 g/dL (ref 3.5–5.2)
Alkaline Phosphatase: 109 U/L (ref 39–117)
BUN: 27 mg/dL — ABNORMAL HIGH (ref 6–23)
CO2: 30 mEq/L (ref 19–32)
Calcium: 9 mg/dL (ref 8.4–10.5)
Chloride: 99 mEq/L (ref 96–112)
Creatinine, Ser: 1.44 mg/dL — ABNORMAL HIGH (ref 0.40–1.20)
GFR: 37.68 mL/min — ABNORMAL LOW (ref >60.00)
Glucose, Bld: 108 mg/dL — ABNORMAL HIGH (ref 70–99)
Potassium: 4.2 mEq/L (ref 3.5–5.1)
Sodium: 139 mEq/L (ref 135–145)
Total Bilirubin: 0.2 mg/dL (ref 0.2–1.2)
Total Protein: 6.7 g/dL (ref 6.0–8.3)

## 2020-10-21 LAB — CBC WITH DIFFERENTIAL/PLATELET
Basophils Absolute: 0.1 10*3/uL (ref 0.0–0.1)
Basophils Relative: 0.8 % (ref 0.0–3.0)
Eosinophils Absolute: 0.3 10*3/uL (ref 0.0–0.7)
Eosinophils Relative: 2.7 % (ref 0.0–5.0)
HCT: 34.8 % — ABNORMAL LOW (ref 36.0–46.0)
Hemoglobin: 10.9 g/dL — ABNORMAL LOW (ref 12.0–15.0)
Lymphocytes Relative: 31.1 % (ref 12.0–46.0)
Lymphs Abs: 2.9 10*3/uL (ref 0.7–4.0)
MCHC: 31.4 g/dL (ref 30.0–36.0)
MCV: 74.6 fl — ABNORMAL LOW (ref 78.0–100.0)
Monocytes Absolute: 0.5 10*3/uL (ref 0.1–1.0)
Monocytes Relative: 5.2 % (ref 3.0–12.0)
Neutro Abs: 5.6 10*3/uL (ref 1.4–7.7)
Neutrophils Relative %: 60.2 % (ref 43.0–77.0)
Platelets: 362 10*3/uL (ref 150.0–400.0)
RBC: 4.67 Mil/uL (ref 3.87–5.11)
RDW: 16.7 % — ABNORMAL HIGH (ref 11.5–15.5)
WBC: 9.3 10*3/uL (ref 4.0–10.5)

## 2020-10-25 ENCOUNTER — Ambulatory Visit (INDEPENDENT_AMBULATORY_CARE_PROVIDER_SITE_OTHER): Payer: Medicare HMO | Admitting: Family Medicine

## 2020-10-25 ENCOUNTER — Telehealth: Payer: Self-pay

## 2020-10-25 ENCOUNTER — Other Ambulatory Visit: Payer: Self-pay

## 2020-10-25 VITALS — BP 150/84 | HR 71 | Temp 98.4°F | Wt 233.0 lb

## 2020-10-25 DIAGNOSIS — K5792 Diverticulitis of intestine, part unspecified, without perforation or abscess without bleeding: Secondary | ICD-10-CM

## 2020-10-25 DIAGNOSIS — R1012 Left upper quadrant pain: Secondary | ICD-10-CM

## 2020-10-25 DIAGNOSIS — R197 Diarrhea, unspecified: Secondary | ICD-10-CM

## 2020-10-25 NOTE — Telephone Encounter (Signed)
lvm asking pt to call office to schedule 

## 2020-10-25 NOTE — Patient Instructions (Signed)
Go to the lab to do stool test  Urgent imaging

## 2020-10-25 NOTE — Telephone Encounter (Signed)
Would recommend re-evaluation in the office. She can see another provider today if available.

## 2020-10-25 NOTE — Assessment & Plan Note (Signed)
Abdominal pain and exam are worse and more concerning today - rebound tenderness and hypoactive bowel sounds today. Will get CT scan to evaluate.

## 2020-10-25 NOTE — Telephone Encounter (Signed)
Viewed schedule and pt scheduled today with pcp.

## 2020-10-25 NOTE — Assessment & Plan Note (Signed)
Pt with new onset diarrhea in setting of antibiotic treatment. She has had some recent hospitalizations and abdominal pain worsening with hypoactive bowel sounds. Will test Cdiff to rule out, though may be an antibiotic side effect. Encouraged hydration

## 2020-10-25 NOTE — Progress Notes (Signed)
Subjective:     Morgan [name] is a 69 y.o. female presenting for Follow-up (5 days )     HPI   #Abdominal pain - have diarrhea - started antibiotics with improvement in pain - yesterday had diarrhea - no did have vomiting Sunday night - still feeling nauseous even with medication   - 10 BM today  Trying to drink as much as she can   Review of Systems   Social History   Tobacco Use  Smoking Status Former Smoker  . Packs/day: 1.00  . Years: 15.00  . Pack years: 15.00  . Types: Cigarettes  . Quit date: 09/12/2017  . Years since quitting: 3.1  Smokeless Tobacco Never Used        Objective:    BP Readings from Last 3 Encounters:  10/25/20 (!) 150/84  10/20/20 128/78  09/29/20 124/70   Wt Readings from Last 3 Encounters:  10/25/20 233 lb (105.7 kg)  10/20/20 230 lb 12 oz (104.7 kg)  09/29/20 235 lb 3.2 oz (106.7 kg)    BP (!) 150/84   Pulse 71   Temp 98.4 F (36.9 C) (Temporal)   Wt 233 lb (105.7 kg)   SpO2 96%   BMI 30.74 kg/m    Physical Exam Constitutional:      General: She is not in acute distress.    Appearance: She is well-developed. She is ill-appearing. She is not diaphoretic.  HENT:     Right Ear: External ear normal.     Left Ear: External ear normal.     Nose: Nose normal.  Eyes:     Conjunctiva/sclera: Conjunctivae normal.  Cardiovascular:     Rate and Rhythm: Normal rate and regular rhythm.     Heart sounds: No murmur heard.   Pulmonary:     Effort: Pulmonary effort is normal. No respiratory distress.     Breath sounds: Normal breath sounds. No wheezing.  Abdominal:     General: Abdomen is protuberant. Bowel sounds are decreased. There is distension.     Palpations: Abdomen is soft.     Tenderness: There is generalized abdominal tenderness and tenderness in the left upper quadrant. There is guarding and rebound.  Musculoskeletal:     Cervical back: Neck supple.  Skin:    General: Skin is warm and dry.     Capillary  Refill: Capillary refill takes less than 2 seconds.  Neurological:     Mental Status: She is alert. Mental status is at baseline.  Psychiatric:        Mood and Affect: Mood normal.        Behavior: Behavior normal.           Assessment & Plan:   Problem List Items Addressed This Visit      Other   Diarrhea - Primary    Pt with new onset diarrhea in setting of antibiotic treatment. She has had some recent hospitalizations and abdominal pain worsening with hypoactive bowel sounds. Will test Cdiff to rule out, though may be an antibiotic side effect. Encouraged hydration      Relevant Orders   C. Difficile Toxin   CT Abdomen Pelvis W Contrast   Abdominal pain    Abdominal pain and exam are worse and more concerning today - rebound tenderness and hypoactive bowel sounds today. Will get CT scan to evaluate.       Relevant Orders   CT Abdomen Pelvis W Contrast   Diverticulitis    Treated based  on exam - some initial improvement but this may be due to pain medication. Now with worsening abdominal pain likely either other etiology or failed oral treatment of diverticulitis. Blood work from last week was reassuring but will get CT scan to help with diagnosis and management. Cont antibiotics pending results of CT          Return if symptoms worsen or fail to improve.  [name], MD  This visit occurred during the SARS-CoV-2 public health emergency.  Safety protocols were in place, including screening questions prior to the visit, additional usage of staff PPE, and extensive cleaning of exam room while observing appropriate contact time as indicated for disinfecting solutions.

## 2020-10-25 NOTE — Addendum Note (Signed)
Addended by: Ellamae Sia on: 10/25/2020 04:31 PM   Modules accepted: Orders

## 2020-10-25 NOTE — Telephone Encounter (Signed)
Called patient to schedule. Held slot on [facility] in case.

## 2020-10-25 NOTE — Telephone Encounter (Signed)
Can you assist with this, please?

## 2020-10-25 NOTE — Assessment & Plan Note (Signed)
Treated based on exam - some initial improvement but this may be due to pain medication. Now with worsening abdominal pain likely either other etiology or failed oral treatment of diverticulitis. Blood work from last week was reassuring but will get CT scan to help with diagnosis and management. Cont antibiotics pending results of CT

## 2020-10-25 NOTE — Telephone Encounter (Signed)
Pt left v/m that pt felt better on 10/24/20; then in v/m had diarrhea all day on 10/24/20 and immodium was not helpful. 10/25/20 pt is really hurting. Pt seen 10/20/20 and noted if not better to be seen in 4 days; Dr Einar Pheasant does not have available appts on 10/25/20 or 10/26/20 and in 10/20/20 office note CT if pain worsens and c diff test if diarrhea worsens. Pt request cb after review by Dr Einar Pheasant. Sending to Dr Einar Pheasant and Adonis Brook CMA.

## 2020-10-26 ENCOUNTER — Telehealth: Payer: Self-pay | Admitting: *Deleted

## 2020-10-26 ENCOUNTER — Ambulatory Visit
Admission: RE | Admit: 2020-10-26 | Discharge: 2020-10-26 | Disposition: A | Discharge status: Home / Self Care | Payer: Medicare HMO | Source: Ambulatory Visit | Attending: Family Medicine | Admitting: Family Medicine

## 2020-10-26 DIAGNOSIS — R197 Diarrhea, unspecified: Secondary | ICD-10-CM | POA: Diagnosis present

## 2020-10-26 DIAGNOSIS — R1012 Left upper quadrant pain: Secondary | ICD-10-CM | POA: Diagnosis present

## 2020-10-26 LAB — C. DIFFICILE GDH AND TOXIN A/B
GDH ANTIGEN: NOT DETECTED
MICRO NUMBER:: 11179768
SPECIMEN QUALITY:: ADEQUATE
TOXIN A AND B: NOT DETECTED

## 2020-10-26 MED ORDER — IOHEXOL 300 MG/ML  SOLN
75.0000 mL | Freq: Once | INTRAMUSCULAR | Status: AC | PRN
  Administered 2020-10-26: 75 mL via INTRAVENOUS

## 2020-10-26 NOTE — Telephone Encounter (Signed)
Colletta Maryland with radiology called to let Dr. Einar Pheasant know that the CT results are in Epic for her to see. Colletta Maryland stated that the patient is there waiting. Colletta Maryland stated that they were advised to keep the patient there until the doctor saw the results. Information given to Dr. Einar Pheasant to call radiology back.

## 2020-10-26 NOTE — Telephone Encounter (Signed)
Pt notified of results. Cont home pain management.

## 2020-10-31 ENCOUNTER — Ambulatory Visit
Admission: EM | Admit: 2020-10-31 | Discharge: 2020-10-31 | Disposition: A | Discharge status: Home / Self Care | Payer: Medicare HMO | Attending: Physician Assistant | Admitting: Physician Assistant

## 2020-10-31 ENCOUNTER — Other Ambulatory Visit: Payer: Self-pay

## 2020-10-31 ENCOUNTER — Ambulatory Visit (INDEPENDENT_AMBULATORY_CARE_PROVIDER_SITE_OTHER): Payer: Medicare HMO

## 2020-10-31 ENCOUNTER — Telehealth: Payer: Self-pay

## 2020-10-31 DIAGNOSIS — R0602 Shortness of breath: Secondary | ICD-10-CM | POA: Diagnosis not present

## 2020-10-31 DIAGNOSIS — Z87891 Personal history of nicotine dependence: Secondary | ICD-10-CM

## 2020-10-31 DIAGNOSIS — J441 Chronic obstructive pulmonary disease with (acute) exacerbation: Secondary | ICD-10-CM

## 2020-10-31 DIAGNOSIS — R059 Cough, unspecified: Secondary | ICD-10-CM

## 2020-10-31 DIAGNOSIS — Z20822 Contact with and (suspected) exposure to covid-19: Secondary | ICD-10-CM | POA: Diagnosis not present

## 2020-10-31 MED ORDER — HYDROCODONE-HOMATROPINE 5-1.5 MG/5ML PO SYRP
5.0000 mL | ORAL_SOLUTION | Freq: Four times a day (QID) | ORAL | 0 refills | Status: DC | PRN
Start: 2020-10-31 — End: 2020-11-22

## 2020-10-31 MED ORDER — ALBUTEROL SULFATE HFA 108 (90 BASE) MCG/ACT IN AERS
1.0000 | INHALATION_SPRAY | Freq: Four times a day (QID) | RESPIRATORY_TRACT | 0 refills | Status: DC | PRN
Start: 2020-10-31 — End: 2022-01-30

## 2020-10-31 MED ORDER — PREDNISONE 50 MG PO TABS: 50.0000 mg | ORAL_TABLET | Freq: Every day | ORAL | 0 refills | Status: DC

## 2020-10-31 NOTE — Telephone Encounter (Signed)
Pt left v/m but the v/m was broken and unable to understand; got the phone # off caller ID and left v/m for pt to please cb with symptoms and her needs.

## 2020-10-31 NOTE — ED Provider Notes (Signed)
[name]    CSN: [id number] Arrival date & time: 10/31/20  1434      History   Chief Complaint Chief Complaint  Patient presents with  . Cough  . Nasal Congestion    HPI Morgan [name] is a 69 y.o. female.   69 year old female with history of CHF, HTN, COPD on 2L O2 at night comes in for 3 day of URI symptoms. Has had nonproductive cough, nasal congestion, sore throat, shortness of breath. Denies fever. Has had mid sternum and bilateral rib pain, worse with cough. States O2 ranging 89%-98% at home. Gained 3-4 lbs from baseline. Denies obvious leg swelling. Does admit to some orthopnea. Takes lasix 40mg  daily, has not increased doseage.      Past Medical History:  Diagnosis Date  . Allergy   . Anemia   . Anxiety   . Arthritis   . CHF (congestive heart failure) ([place])   . Depression   . GERD (gastroesophageal reflux disease)   . Hypertension   . MVP (mitral valve prolapse)     Patient Active Problem List   Diagnosis Date Noted  . Acute diverticulitis 09/02/2020  . Diverticulitis 09/01/2020  . SBO (small bowel obstruction) ([place]) 08/09/2020  . Chronic diastolic CHF (congestive heart failure) ([place]) 08/09/2020  . COPD (chronic obstructive pulmonary disease) ([place]) 08/09/2020  . Chronic respiratory failure with hypoxia ([place]) 08/09/2020  . Polyp of sigmoid colon   . Polyp of cecum   . Gastritis and gastroduodenitis   . Pylorus ulcer   . Pyloric stenosis in adult   . Chronic cholecystitis 05/19/2020  . Oxygen dependent 03/30/2020  . Nausea and vomiting in adult 03/22/2020  . Peptic ulcer disease 03/22/2020  . Iron deficiency anemia 03/22/2020  . Acute pain of right knee 12/29/2019  . Acute left-sided low back pain without sciatica 12/29/2019  . Fall 04/08/2019  . Abdominal pain 02/19/2019  . Asymptomatic bacteriuria 02/19/2019  . CKD (chronic kidney disease) stage 3, GFR 30-59 ml/min (HCC) 01/13/2019  . CHF (congestive heart failure) ([place]) 01/13/2019   . Dizziness 01/13/2019  . Hoarseness 11/12/2018  . Dysphagia 11/12/2018  . Sleepiness 11/12/2018  . CVA (cerebral vascular accident) ([place]) 08/22/2018  . Left-sided weakness 10/04/2017  . HLD (hyperlipidemia) 10/04/2017  . GERD (gastroesophageal reflux disease) 10/04/2017  . Anxiety 08/26/2017  . Essential hypertension   . Depression   . Diarrhea 02/22/2015  . Hypokalemia 02/22/2015  . Chronic lower back pain 04/29/2013  . Paresthesia 04/29/2013    Past Surgical History:  Procedure Laterality Date  . ABDOMINAL HYSTERECTOMY    . APPENDECTOMY    . BALLOON DILATION N/A 04/21/2020   Procedure: BALLOON DILATION;  Surgeon: Milus Banister, MD;  Location: [facility];  Service: Endoscopy;  Laterality: N/A;  pyloric/ GI  . BALLOON DILATION N/A 07/04/2020   Procedure: BALLOON DILATION;  Surgeon: Mauri Pole, MD;  Location: [facility];  Service: Endoscopy;  Laterality: N/A;  . BIOPSY  04/21/2020   Procedure: BIOPSY;  Surgeon: Milus Banister, MD;  Location: [facility];  Service: Endoscopy;;  . BIOPSY  07/04/2020   Procedure: BIOPSY;  Surgeon: Mauri Pole, MD;  Location: [facility];  Service: Endoscopy;;  EGD and COLON  . BREAST SURGERY Right 1988   tumor removal  . CHOLECYSTECTOMY N/A 05/18/2020   Procedure: LAPAROSCOPIC CHOLECYSTECTOMY;  Surgeon: Johnathan Hausen, MD;  Location: [facility];  Service: General;  Laterality: N/A;  . COLONOSCOPY WITH PROPOFOL N/A 07/04/2020   Procedure:  COLONOSCOPY WITH PROPOFOL;  Surgeon: Mauri Pole, MD;  Location: [facility];  Service: Endoscopy;  Laterality: N/A;  . ESOPHAGEAL DILATION  08/11/2020   Procedure: PYLORIC DILATION;  Surgeon: Doran Stabler, MD;  Location: [facility];  Service: Gastroenterology;;  . ESOPHAGOGASTRODUODENOSCOPY (EGD) WITH PROPOFOL N/A 04/21/2020   Procedure: ESOPHAGOGASTRODUODENOSCOPY (EGD) WITH PROPOFOL;  Surgeon: Milus Banister, MD;  Location: [facility];  Service: Endoscopy;  Laterality: N/A;  .  ESOPHAGOGASTRODUODENOSCOPY (EGD) WITH PROPOFOL N/A 07/04/2020   Procedure: ESOPHAGOGASTRODUODENOSCOPY (EGD) WITH PROPOFOL;  Surgeon: Mauri Pole, MD;  Location: [facility];  Service: Endoscopy;  Laterality: N/A;  . ESOPHAGOGASTRODUODENOSCOPY (EGD) WITH PROPOFOL N/A 08/11/2020   Procedure: ESOPHAGOGASTRODUODENOSCOPY (EGD) WITH PROPOFOL;  Surgeon: Doran Stabler, MD;  Location: [facility];  Service: Gastroenterology;  Laterality: N/A;  . KNEE SURGERY  2005   2 times left  . POLYPECTOMY  07/04/2020   Procedure: POLYPECTOMY;  Surgeon: Mauri Pole, MD;  Location: [facility];  Service: Endoscopy;;  . TUBAL LIGATION      OB History   No obstetric history on file.      Home Medications    Prior to Admission medications   Medication Sig Start Date End Date Taking? Authorizing Provider  albuterol (VENTOLIN HFA) 108 (90 Base) MCG/ACT inhaler Inhale 1-2 puffs into the lungs every 6 (six) hours as needed for wheezing or shortness of breath. 10/31/20   Tasia Catchings, Jentry Mcqueary V, PA-C  ARIPiprazole (ABILIFY) 2 MG tablet Take 2 mg by mouth daily. 07/14/20   [provider]  atorvastatin (LIPITOR) 40 MG tablet Take 40 mg by mouth daily.     [provider]  carvedilol (COREG) 3.125 MG tablet Take 3.125 mg by mouth in the morning and at bedtime.    [provider]  Ferrous Sulfate (IRON) 325 (65 Fe) MG TABS Take 1 tablet (325 mg total) by mouth daily. 04/09/19   [name], MD  FLUoxetine (PROZAC) 40 MG capsule Take 40 mg by mouth daily.    [provider]  furosemide (LASIX) 40 MG tablet Take 1 tablet (40 mg total) by mouth daily as needed for fluid or edema. Patient taking differently: Take 40 mg by mouth daily.  08/22/20   Mercy Riding, MD  gabapentin (NEURONTIN) 400 MG capsule Take 400 mg by mouth 3 (three) times daily.  10/20/18   [provider]  HYDROcodone-homatropine (HYCODAN) 5-1.5 MG/5ML syrup Take 5 mLs by mouth every 6 (six) hours as  needed for cough. 10/31/20   Tasia Catchings, Dmario Russom V, PA-C  hydrOXYzine (ATARAX/VISTARIL) 25 MG tablet Take 12.5-25 mg by mouth as needed for anxiety.     [provider]  Lactobacillus (PROBIOTIC ACIDOPHILUS) CAPS Take 1 tablet by mouth in the morning and at bedtime. Patient taking differently: Take 1 tablet by mouth at bedtime.  08/18/20   Mercy Riding, MD  LINZESS 145 MCG CAPS capsule TAKE 1 CAPSULE (145 MCG TOTAL) BY MOUTH DAILY BEFORE BREAKFAST. Patient taking differently: Take 145 mcg by mouth daily.  09/07/20   Mauri Pole, MD  metroNIDAZOLE (FLAGYL) 500 MG tablet Take 1 tablet (500 mg total) by mouth 3 (three) times daily. 10/20/20   [name], MD  mirtazapine (REMERON) 15 MG tablet Take 15 mg by mouth at bedtime. 10/20/20   [provider]  naloxegol oxalate (MOVANTIK) 25 MG TABS tablet Take 1 tablet (25 mg total) by mouth daily. 09/29/20   Mauri Pole, MD  omeprazole ([place])  40 MG capsule Take 1 capsule (40 mg total) by mouth daily. 08/29/20   [name], MD  ondansetron (ZOFRAN-ODT) 4 MG disintegrating tablet Take 1 tablet (4 mg total) by mouth every 8 (eight) hours as needed for nausea or vomiting. 10/03/20   Willia Craze, NP  oxycodone (OXY-IR) 5 MG capsule Take 1 capsule (5 mg total) by mouth every 8 (eight) hours as needed for pain. 10/20/20   [name], MD  OXYGEN Inhale into the lungs. 02 at bedtime    [provider]  potassium chloride 20 MEQ TBCR Take 10 mEq by mouth daily. 08/18/20   Mercy Riding, MD  predniSONE (DELTASONE) 50 MG tablet Take 1 tablet (50 mg total) by mouth daily with breakfast. 10/31/20   Tasia Catchings, Wanette Robison V, PA-C  psyllium (METAMUCIL SMOOTH TEXTURE) 58.6 % powder Take 1 packet by mouth 3 (three) times daily. 08/18/20   Mercy Riding, MD  sucralfate (CARAFATE) 1 g tablet TAKE 1 TABLET (1 G TOTAL) BY MOUTH 4 (FOUR) TIMES DAILY - WITH MEALS AND AT BEDTIME. 07/26/20   Mauri Pole, MD    Family History Family History    Problem Relation Age of Onset  . Breast cancer Mother 92  . Ovarian cancer Mother 63  . Brain cancer Mother   . Hypertension Father   . Hyperlipidemia Father   . Heart disease Father   . Colon cancer Father   . Breast cancer Maternal Grandmother 81  . Diabetes Paternal Grandmother   . Breast cancer Paternal Grandmother 71  . Colon cancer Maternal Grandfather   . Liver disease Maternal Grandfather   . Breast cancer Maternal Aunt 60  . Breast cancer Maternal Aunt 60  . Colon cancer Maternal Uncle   . Stomach cancer Neg Hx   . Pancreatic cancer Neg Hx   . Esophageal cancer Neg Hx     Social History Social History   Tobacco Use  . Smoking status: Former Smoker    Packs/day: 1.00    Years: 15.00    Pack years: 15.00    Types: Cigarettes    Quit date: 09/12/2017    Years since quitting: 3.1  . Smokeless tobacco: Never Used  Vaping Use  . Vaping Use: Never used  Substance Use Topics  . Alcohol use: Not Currently  . Drug use: No     Allergies   Meperidine, Strawberry extract, Strawberry (diagnostic), Wasp venom, and Wasp venom protein   Review of Systems Review of Systems  Reason unable to perform ROS: See HPI as above.     Physical Exam Triage Vital Signs ED Triage Vitals  Enc Vitals Group     BP 10/31/20 1443 (!) 163/90     Pulse Rate 10/31/20 1443 82     Resp 10/31/20 1443 20     Temp 10/31/20 1443 98 F (36.7 C)     Temp Source 10/31/20 1443 Oral     SpO2 10/31/20 1443 95 %     Weight --      Height --      Head Circumference --      Peak Flow --      Pain Score 10/31/20 1459 8     Pain Loc --      Pain Edu? --      Excl. in [place]? --    No data found.  Updated Vital Signs BP (!) 163/90 (BP Location: Left Arm)   Pulse 82   Temp 98  F (36.7 C) (Oral)   Resp 20   SpO2 95%   Physical Exam Constitutional:      General: She is not in acute distress.    Appearance: Normal appearance. She is well-developed. She is not toxic-appearing or  diaphoretic.  HENT:     Head: Normocephalic and atraumatic.  Eyes:     Conjunctiva/sclera: Conjunctivae normal.     Pupils: Pupils are equal, round, and reactive to light.  Cardiovascular:     Rate and Rhythm: Normal rate and regular rhythm.  Pulmonary:     Effort: Pulmonary effort is normal. No respiratory distress.     Comments: Diffuse rhonchi with occasional expiratory wheezing. Coughing throughout exam Musculoskeletal:     Cervical back: Normal range of motion and neck supple.     Right lower leg: No edema.     Left lower leg: No edema.  Skin:    General: Skin is warm and dry.  Neurological:     Mental Status: She is alert and oriented to person, place, and time.      UC Treatments / Results  Labs (all labs ordered are listed, but only abnormal results are displayed) Labs Reviewed  COVID-19, FLU A+B AND RSV    EKG   Radiology No results found.  Procedures Procedures (including critical care time)  Medications Ordered in UC Medications - No data to display  Initial Impression / Assessment and Plan / UC Course  I have reviewed the triage vital signs and the nursing notes.  Pertinent labs & imaging results that were available during my care of the patient were reviewed by me and considered in my medical decision making (see chart for details).    Discussed chest x-ray results with patient.  Currently history and exam most consistent with COPD exacerbation.  However, patient to continue to monitor weight.  Covid testing ordered, patient to quarantine until testing results return.  Prednisone, albuterol as directed.  Hycodan as needed for cough.  Low threshold for ED visit.  Strict return precautions given.  Otherwise PCP follow-up.  Patient expresses understanding and agrees to plan.  Patient takes oxycodone intermittently for diverticulitis.  Not taking at this time.  Final Clinical Impressions(s) / UC Diagnoses   Final diagnoses:  Encounter for screening  laboratory testing for COVID-19 virus  COPD exacerbation [facility])    ED Prescriptions    Medication Sig Dispense Auth. Provider   predniSONE (DELTASONE) 50 MG tablet Take 1 tablet (50 mg total) by mouth daily with breakfast. 5 tablet Alice Burnside V, PA-C   albuterol (VENTOLIN HFA) 108 (90 Base) MCG/ACT inhaler Inhale 1-2 puffs into the lungs every 6 (six) hours as needed for wheezing or shortness of breath. 18 g Ronisha Herringshaw V, PA-C   HYDROcodone-homatropine (HYCODAN) 5-1.5 MG/5ML syrup Take 5 mLs by mouth every 6 (six) hours as needed for cough. 120 mL Ok Edwards, PA-C     PDMP not reviewed this encounter.   Ok Edwards, PA-C 10/31/20 1600

## 2020-10-31 NOTE — Telephone Encounter (Signed)
I spoke with pt;starting on 10/29/20 head and chest congestion, S/T. Wheezing in chest; pt has deep dry hacky cough and has CP when coughs; pt is not sure if fever or not; pt has SOB all the time; pt is taking thera flu. Pt was on 2 L of oxygen on 10/30/20 that helped a little bit. Pt has not used oxygen today. Pt had office visit on 10/25/20 but did not have any of these symptoms and pt was wondering if could get an abx. I advised pt as congested and deep cough as pt has on phone I think she needs a face to face visit with possible CXR. Pt voiced understanding and will go to [facility] on [address]. FYI to Dr Einar Pheasant.

## 2020-10-31 NOTE — Discharge Instructions (Signed)
Chest xray without any new changes.  Covid testing ordered, please quarantine until testing results return.  Start albuterol and prednisone as directed.  Hycodan as needed for cough.  Keep checking your oxygen level, if dropping below 90%, please go to the emergency department for further evaluation.  If having chest pain, worsening shortness of breath, significant weight gain, go to the emergency department for further evaluation.  If Covid testing negative, please follow-up with PCP for further evaluation management needed.

## 2020-10-31 NOTE — Telephone Encounter (Signed)
Agree with triage advice.

## 2020-10-31 NOTE — ED Triage Notes (Signed)
Pt presents with complaints of shortness of breath at all time, dry cough, nasal congestion, and sore throat since Saturday. Pt denies any fever. Reports pain in her ribs and back from coughing. Pt denies any other symptoms. Reports no relief with otc treatment such as theraflu. Pt wears 2L of oxygen at night and last night had to use it most of the day.

## 2020-10-31 NOTE — Telephone Encounter (Signed)
Returning Mrs Mearl Latin phone call

## 2020-11-01 ENCOUNTER — Telehealth: Payer: Self-pay | Admitting: Family Medicine

## 2020-11-01 ENCOUNTER — Telehealth: Payer: Self-pay

## 2020-11-01 LAB — COVID-19, FLU A+B AND RSV
Influenza A, NAA: NOT DETECTED
Influenza B, NAA: NOT DETECTED
RSV, NAA: NOT DETECTED
SARS-CoV-2, NAA: DETECTED — AB

## 2020-11-01 MED ORDER — GUAIFENESIN-CODEINE 100-10 MG/5ML PO SOLN
5.0000 mL | Freq: Four times a day (QID) | ORAL | 0 refills | Status: DC | PRN
Start: 2020-11-01 — End: 2020-11-22

## 2020-11-01 NOTE — Telephone Encounter (Signed)
Pt called in due to she went to urgent care yesterday and they informed her she had broconttis and also tested positive for covid and she had both vaccines

## 2020-11-01 NOTE — Telephone Encounter (Signed)
Appreciate the update. Routing to nurse and Allie Bossier to see if we can get her on the list for monoclonal antibody treatment.

## 2020-11-01 NOTE — Telephone Encounter (Signed)
Patient placed on monoclonal antibody treatment list. She should be contacted either tonight or tomorrow.

## 2020-11-02 ENCOUNTER — Telehealth: Payer: Self-pay | Admitting: Nurse Practitioner

## 2020-11-02 NOTE — Telephone Encounter (Signed)
Called to Discuss with patient about Covid symptoms and the use of the monoclonal antibody infusion for those with mild to moderate Covid symptoms and at a high risk of hospitalization.     Pt appears to qualify for this infusion due to co-morbid conditions and/or a member of an at-risk group in accordance with the FDA Emergency Use Authorization. (BMI.25, hypertension, oxygen dependent/COPD).   After discussing with patient she has chosen to decline infusion at this time. States she does not feel bad and starting to feel better. She has contact information to the hotline if she changes her mind or symptoms worsen. Sx onset reported as 10/31/20.   Morgan Lea, NP [facility]  [phone]

## 2020-11-22 ENCOUNTER — Ambulatory Visit (INDEPENDENT_AMBULATORY_CARE_PROVIDER_SITE_OTHER): Payer: Medicare HMO | Admitting: Physician Assistant

## 2020-11-22 ENCOUNTER — Encounter: Payer: Self-pay | Admitting: Physician Assistant

## 2020-11-22 ENCOUNTER — Other Ambulatory Visit: Payer: Self-pay | Admitting: *Deleted

## 2020-11-22 VITALS — BP 138/80 | HR 81 | Ht 73.0 in | Wt 235.0 lb

## 2020-11-22 DIAGNOSIS — K5900 Constipation, unspecified: Secondary | ICD-10-CM | POA: Diagnosis not present

## 2020-11-22 DIAGNOSIS — R109 Unspecified abdominal pain: Secondary | ICD-10-CM

## 2020-11-22 DIAGNOSIS — R110 Nausea: Secondary | ICD-10-CM | POA: Diagnosis not present

## 2020-11-22 DIAGNOSIS — K5792 Diverticulitis of intestine, part unspecified, without perforation or abscess without bleeding: Secondary | ICD-10-CM

## 2020-11-22 DIAGNOSIS — R1030 Lower abdominal pain, unspecified: Secondary | ICD-10-CM

## 2020-11-22 MED ORDER — OXYCODONE HCL 5 MG PO CAPS: 5.0000 mg | ORAL_CAPSULE | Freq: Three times a day (TID) | ORAL | 0 refills | Status: DC | PRN

## 2020-11-22 MED ORDER — ONDANSETRON 4 MG PO TBDP: 4.0000 mg | ORAL_TABLET | Freq: Three times a day (TID) | ORAL | 3 refills | Status: DC | PRN

## 2020-11-22 NOTE — Patient Instructions (Signed)
If you are age 67 or older, your body mass index should be between 23-30. Your Body mass index is 31 kg/m. If this is out of the aforementioned range listed, please consider follow up with your Primary Care Provider.  If you are age 67 or younger, your body mass index should be between 19-25. Your Body mass index is 31 kg/m. If this is out of the aformentioned range listed, please consider follow up with your Primary Care Provider.   You have been scheduled for an Upper GI Series and Small Bowel Follow Thru at [facility] 1st floor. Your appointment is on 11/29/20 at 9:30am. Please arrive 15 minutes prior to your test for registration. Make certain not to have anything to eat or drink after midnight on the night before your test. If you need to reschedule, please contact radiology at [phone]. --------------------------------------------------------------------------------------------------------------- An upper GI series uses x rays to help diagnose problems of the upper GI tract, which includes the esophagus, stomach, and duodenum. The duodenum is the first part of the small intestine. An upper GI series is conducted by a radiology technologist or a radiologist--a doctor who specializes in x-ray imaging--at a hospital or outpatient center. While sitting or standing in front of an x-ray machine, the patient drinks barium liquid, which is often white and has a chalky consistency and taste. The barium liquid coats the lining of the upper GI tract and makes signs of disease show up more clearly on x rays. X-ray video, called fluoroscopy, is used to view the barium liquid moving through the esophagus, stomach, and duodenum. Additional x rays and fluoroscopy are performed while the patient lies on an x-ray table. To fully coat the upper GI tract with barium liquid, the technologist or radiologist may press on the abdomen or ask the patient to change position. Patients hold still in various  positions, allowing the technologist or radiologist to take x rays of the upper GI tract at different angles. If a technologist conducts the upper GI series, a radiologist will later examine the images to look for problems.  This test typically takes about 1 hour to complete --------------------------------------------------------------------------------------------------------------------------------------------- The Small Bowel Follow Thru examination is used to visualize the entire small bowel (intestines); specifically the connection between the small and large intestine. You will be positioned on a flat x-ray table and an image of your abdomen taken. Then the technologist will show the x-ray to the radiologist. The radiologist will instruct your technologist how much (1-2 cups) barium sulfate you will drink and when to begin taking the timed x-rays, usually 15-30 minutes after you begin drinking. Barium is a harmless substance that will highlight your small intestine by absorbing x-ray. The taste is chalky and it feels very heavy both in the cup and in your stomach.  After the first x-ray is taken and shown to the radiologist, he/she will determine when the next image is to be taken. This is repeated until the barium has reached the end of the small intestine and enters the beginning of the colon (cecum). At such time when the barium spills into the colon, you will be positioned on the x-ray table once again. The radiologist will use a fluoroscopic camera to take some detailed pictures of the connection between your small intestine and colon. The fluoroscope is an x-ray unit that works with a television/computer screen. The radiologist will apply pressure to your abdomen with his/her hand and a lead glove, a plastic paddle, or a paddle with an  inflated rubber balloon on the end. This is to spread apart your loops of intestine so he/she can see all areas.   This test typically takes around 1 hour to  complete.  Important  Drink plenty of water (8-10 cups/day) for a few days following the procedure to avoid constipation and blockage. The barium will make your stools white for a few days. -------------------------------------------------------------------------------------------------------------------------------------------- We have sent a referral to Sports Medicine they will call you to make an appointment.  Follow up as needed   Thank you for choosing me and [facility].  Ellouise Newer, PA-C

## 2020-11-22 NOTE — Progress Notes (Signed)
Chief Complaint: Nausea and abdominal pain  HPI:    Morgan [name] is a 69 year old Caucasian female with a past medical history as listed below including CHF and GERD, known to Dr. Silverio Decamp, who presents clinic today with a complaint of nausea and abdominal pain.    09/29/2020 patient seen in clinic by Dr. Silverio Decamp for abdominal pain, GERD and dysphagia.  At that time described continued left-sided abdominal pain, apparently had been put empirically treated with Cipro Flagyl for possible mild diverticulitis.  Described irregular bowel habits with alternating constipation diarrhea, abdominal bloating nausea and regurgitation.  CT abdomen pelvis 09/02/2020 showed diverticulosis with no acute diverticulitis.  At that time discussed that left-sided abdominal pain had unclear etiology with no acute pathology noted on recent CT.  An upper GI series with small bowel follow-through was ordered.  She was continued on PPI.  Unclear etiology of nausea and was given Zofran as needed for severe symptoms.  Also discussed considering esophageal manometry of continued to have persistent symptoms with dysphagia if she had no abnormality on barium study.  Also given a trial of Movantik 25 mg daily for her opiate induced constipation.  Also recommended Benefiber 1 tablespoon 3 times daily with meals.    Patient never had upper GI study with small bowel follow-through.    10/26/2020 CT abdomen pelvis with contrast showed colonic diverticulosis without evidence of acute diverticulitis.    10/31/2020 patient diagnosed with Covid.  She declined monoclonal antibody infusion.    Today, the patient returns to clinic and continues to describe the same complaint she had with Dr. Silverio Decamp a month and a half ago.  Most concerning to her today is her left upper quadrant pain which she tells "flares up" and is a 10/10 and sharp in nature and can be quite constant, the only thing that seems to dampen the slightly is Oxycodone which she has  run out of, typically prescribed by her PCP.  Nothing else seems to help including heating pads etc.  Also describes that nothing typically makes this worse.  No change with bowel movements or eating.    Currently using the Benefiber 3 times a day and Movantik.  Tells me that she has a "sludgy" loose stool after every meal that she eats during the day which feels like she is emptying out, this is an improvement from her constipation.    Continues to describe nausea, sometimes taking 2 or 3 Zofran a day, this is unchanged from previous.    Denies fever, chills, blood in her stool or weight loss.   PREVIOUS ENDOSCOPIC EVALUATIONS / GI STUDIES :   8/224/21 CT scan  abd/ pelvis with contrast -Few mildly dilated loops of small bowel in the mid abdomen with air-fluid levels suggesting low-grade obstruction.   07/04/20 EGD LA Grade B reflux esophagitis with no bleeding. - Small hiatal hernia. - Gastritis with hemorrhage. Biopsied. - Non-bleeding gastric ulcer with a clean ulcer base (Forrest Class III). Biopsied. - Gastric stenosis was found at the pylorus. Dilated. - Rule out malignancy, duodenal mass. Biopsied. - Normal second portion of the duodenum.   07/04/20 Colonoscopy Three 1 to 2 mm polyps in the sigmoid colon and in the cecum, removed with a cold biopsy forceps. Resected and retrieved. - Two 4 to 6 mm polyps in the sigmoid colon, removed with a cold snare. Resected and 1 of the 2 polyps retrieved. - Moderate diverticulosis in the sigmoid colon, in the descending colon and in the ascending colon.  There was evidence of an impacted diverticulum. - Non-bleeding internal hemorrhoids.   FINAL MICROSCOPIC DIAGNOSIS:   A. PYLORIC CHANNEL, BIOPSY:  - Gastric antral mucosa with nonspecific reactive gastropathy  - Focal intestinal metaplasia, negative for dysplasia or carcinoma   B. GASTRIC, RANDOM, BIOPSY:  - Gastric antral and oxyntic mucosa with nonspecific reactive  gastropathy  -  Warthin Starry stain is negative for Helicobacter pylori   C. CECAL POLYP, SIGMOID COLON POLYPS, POLYPECTOMY:  - Tubular adenoma without high-grade dysplasia or malignancy  - Hyperplastic polyp(s)    04/21/20 EGD Normal GE junction. - Chronic, benign appearing pyloric stenosis. Biopsied and dilated to 61mm with CRE balloon. - Mild, non-specific gastritis was biopsied to check for H. pylori.    Past Medical History:  Diagnosis Date  . Allergy   . Anemia   . Anxiety   . Arthritis   . CHF (congestive heart failure) ([place])   . Depression   . GERD (gastroesophageal reflux disease)   . Hypertension   . MVP (mitral valve prolapse)     Past Surgical History:  Procedure Laterality Date  . ABDOMINAL HYSTERECTOMY    . APPENDECTOMY    . BALLOON DILATION N/A 04/21/2020   Procedure: BALLOON DILATION;  Surgeon: Milus Banister, MD;  Location: [facility];  Service: Endoscopy;  Laterality: N/A;  pyloric/ GI  . BALLOON DILATION N/A 07/04/2020   Procedure: BALLOON DILATION;  Surgeon: Mauri Pole, MD;  Location: [facility];  Service: Endoscopy;  Laterality: N/A;  . BIOPSY  04/21/2020   Procedure: BIOPSY;  Surgeon: Milus Banister, MD;  Location: [facility];  Service: Endoscopy;;  . BIOPSY  07/04/2020   Procedure: BIOPSY;  Surgeon: Mauri Pole, MD;  Location: [facility];  Service: Endoscopy;;  EGD and COLON  . BREAST SURGERY Right 1988   tumor removal  . CHOLECYSTECTOMY N/A 05/18/2020   Procedure: LAPAROSCOPIC CHOLECYSTECTOMY;  Surgeon: Johnathan Hausen, MD;  Location: [facility];  Service: General;  Laterality: N/A;  . COLONOSCOPY WITH PROPOFOL N/A 07/04/2020   Procedure: COLONOSCOPY WITH PROPOFOL;  Surgeon: Mauri Pole, MD;  Location: [facility];  Service: Endoscopy;  Laterality: N/A;  . ESOPHAGEAL DILATION  08/11/2020   Procedure: PYLORIC DILATION;  Surgeon: Doran Stabler, MD;  Location: [facility];  Service: Gastroenterology;;  . ESOPHAGOGASTRODUODENOSCOPY (EGD)  WITH PROPOFOL N/A 04/21/2020   Procedure: ESOPHAGOGASTRODUODENOSCOPY (EGD) WITH PROPOFOL;  Surgeon: Milus Banister, MD;  Location: [facility];  Service: Endoscopy;  Laterality: N/A;  . ESOPHAGOGASTRODUODENOSCOPY (EGD) WITH PROPOFOL N/A 07/04/2020   Procedure: ESOPHAGOGASTRODUODENOSCOPY (EGD) WITH PROPOFOL;  Surgeon: Mauri Pole, MD;  Location: [facility];  Service: Endoscopy;  Laterality: N/A;  . ESOPHAGOGASTRODUODENOSCOPY (EGD) WITH PROPOFOL N/A 08/11/2020   Procedure: ESOPHAGOGASTRODUODENOSCOPY (EGD) WITH PROPOFOL;  Surgeon: Doran Stabler, MD;  Location: [facility];  Service: Gastroenterology;  Laterality: N/A;  . KNEE SURGERY  2005   2 times left  . POLYPECTOMY  07/04/2020   Procedure: POLYPECTOMY;  Surgeon: Mauri Pole, MD;  Location: [facility];  Service: Endoscopy;;  . TUBAL LIGATION      Current Outpatient Medications  Medication Sig Dispense Refill  . albuterol (VENTOLIN HFA) 108 (90 Base) MCG/ACT inhaler Inhale 1-2 puffs into the lungs every 6 (six) hours as needed for wheezing or shortness of breath. 18 g 0  . ARIPiprazole (ABILIFY) 2 MG tablet Take 2 mg by mouth daily.    Marland Kitchen atorvastatin (LIPITOR) 40 MG tablet Take 40 mg by mouth daily.     Marland Kitchen  carvedilol (COREG) 3.125 MG tablet Take 3.125 mg by mouth in the morning and at bedtime.    . Ferrous Sulfate (IRON) 325 (65 Fe) MG TABS Take 1 tablet (325 mg total) by mouth daily. 90 each 0  . FLUoxetine (PROZAC) 40 MG capsule Take 40 mg by mouth daily.    . furosemide (LASIX) 40 MG tablet Take 1 tablet (40 mg total) by mouth daily as needed for fluid or edema. (Patient taking differently: Take 40 mg by mouth daily. ) 30 tablet   . gabapentin (NEURONTIN) 400 MG capsule Take 400 mg by mouth 3 (three) times daily.     Marland Kitchen guaiFENesin-codeine 100-10 MG/5ML syrup Take 5 mLs by mouth every 6 (six) hours as needed for cough. 120 mL 0  . HYDROcodone-homatropine (HYCODAN) 5-1.5 MG/5ML syrup Take 5 mLs by mouth every 6 (six)  hours as needed for cough. 120 mL 0  . hydrOXYzine (ATARAX/VISTARIL) 25 MG tablet Take 12.5-25 mg by mouth as needed for anxiety.     . Lactobacillus (PROBIOTIC ACIDOPHILUS) CAPS Take 1 tablet by mouth in the morning and at bedtime. (Patient taking differently: Take 1 tablet by mouth at bedtime. ) 60 capsule 0  . LINZESS 145 MCG CAPS capsule TAKE 1 CAPSULE (145 MCG TOTAL) BY MOUTH DAILY BEFORE BREAKFAST. (Patient taking differently: Take 145 mcg by mouth daily. ) 30 capsule 3  . metroNIDAZOLE (FLAGYL) 500 MG tablet Take 1 tablet (500 mg total) by mouth 3 (three) times daily. 21 tablet 0  . mirtazapine (REMERON) 15 MG tablet Take 15 mg by mouth at bedtime.    . naloxegol oxalate (MOVANTIK) 25 MG TABS tablet Take 1 tablet (25 mg total) by mouth daily. 30 tablet 3  . omeprazole (PRILOSEC) 40 MG capsule Take 1 capsule (40 mg total) by mouth daily. 90 capsule 3  . ondansetron (ZOFRAN-ODT) 4 MG disintegrating tablet Take 1 tablet (4 mg total) by mouth every 8 (eight) hours as needed for nausea or vomiting. 30 tablet 0  . oxycodone (OXY-IR) 5 MG capsule Take 1 capsule (5 mg total) by mouth every 8 (eight) hours as needed for pain. 20 capsule 0  . OXYGEN Inhale into the lungs. 02 at bedtime    . potassium chloride 20 MEQ TBCR Take 10 mEq by mouth daily. 30 tablet 1  . predniSONE (DELTASONE) 50 MG tablet Take 1 tablet (50 mg total) by mouth daily with breakfast. 5 tablet 0  . psyllium (METAMUCIL SMOOTH TEXTURE) 58.6 % powder Take 1 packet by mouth 3 (three) times daily. 283 g 12  . sucralfate (CARAFATE) 1 g tablet TAKE 1 TABLET (1 G TOTAL) BY MOUTH 4 (FOUR) TIMES DAILY - WITH MEALS AND AT BEDTIME. 360 tablet 1   No current facility-administered medications for this visit.    Allergies as of 11/22/2020 - Review Complete 10/31/2020  Allergen Reaction Noted  . Meperidine Hives and Anaphylaxis 04/28/2013  . Strawberry extract Swelling 08/09/2020  . Strawberry (diagnostic) Hives 08/26/2017  . Wasp venom  Hives 04/11/2020  . Wasp venom protein  08/09/2020    Family History  Problem Relation Age of Onset  . Breast cancer Mother 61  . Ovarian cancer Mother 47  . Brain cancer Mother   . Hypertension Father   . Hyperlipidemia Father   . Heart disease Father   . Colon cancer Father   . Breast cancer Maternal Grandmother 61  . Diabetes Paternal Grandmother   . Breast cancer Paternal Grandmother 68  . Colon cancer Maternal  Grandfather   . Liver disease Maternal Grandfather   . Breast cancer Maternal Aunt 60  . Breast cancer Maternal Aunt 60  . Colon cancer Maternal Uncle   . Stomach cancer Neg Hx   . Pancreatic cancer Neg Hx   . Esophageal cancer Neg Hx     Social History   Socioeconomic History  . Marital status: Single    Spouse name: Not on file  . Number of children: 2  . Years of education: Not on file  . Highest education level: Not on file  Occupational History  . Not on file  Tobacco Use  . Smoking status: Former Smoker    Packs/day: 1.00    Years: 15.00    Pack years: 15.00    Types: Cigarettes    Quit date: 09/12/2017    Years since quitting: 3.1  . Smokeless tobacco: Never Used  Vaping Use  . Vaping Use: Never used  Substance and Sexual Activity  . Alcohol use: Not Currently  . Drug use: No  . Sexual activity: Not Currently  Other Topics Concern  . 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 Difficulty of Paying Living Expenses: Not on file  Food Insecurity:   . Worried About Ship broker in the Last Year: Not on file  . Ran Out of Food in the Last Year: Not on file  Transportation Needs:   . Lack of Transportation (Medical): Not on file  . Lack of Transportation (Non-Medical): Not on file  Physical Activity:   . Days of Exercise per Week: Not on file  . Minutes of Exercise per Session: Not on file  Stress:   . Feeling of Stress : Not on file  Social Connections:   . Frequency of Communication with Friends and Family: Not on file  . Frequency of Social Gatherings with Friends and Family: Not on file  . Attends Religious Services: Not on file  . Active Member of Clubs or Organizations: Not on file  . Attends Archivist Meetings: Not on file  . Marital Status: Not on file  Intimate Partner Violence:   . Fear of Current or Ex-Partner: Not on file  . Emotionally Abused: Not on file  . Physically Abused: Not on file  . Sexually Abused: Not on file    Review of Systems:    Constitutional: No weight loss, fever or chills Cardiovascular: No chest pain Respiratory: No SOB Gastrointestinal: See HPI and otherwise negative   Physical Exam:  Vital signs: BP 138/80   Pulse 81   Ht 6\' 1"  (1.854 m)   Wt 235 lb (106.6 kg)   BMI 31.00 kg/m   Constitutional:   Pleasant overweight Caucasian female appears to be in NAD, Well developed, Well nourished, alert and cooperative Respiratory: Respirations even and unlabored. Lungs clear to auscultation bilaterally.   No wheezes, crackles, or rhonchi.  Cardiovascular: Normal S1, S2. No MRG. Regular rate and rhythm.  No peripheral edema, cyanosis or pallor.  Gastrointestinal:  Soft, nondistended, +left sided abdominal pain to light palpation. No rebound or guarding. Normal bowel sounds. No appreciable masses or hepatomegaly. Rectal:  Not performed.  Msk:  Symmetrical without gross deformities. +tender to palpation along left thoracic spine and left sided rib cage and also to light palpation on left side of  abdomen Psychiatric:  Demonstrates good judgement and reason without abnormal affect or behaviors.  RELEVANT LABS AND IMAGING: CBC    Component Value Date/Time   WBC 9.3 10/20/2020 1511   RBC 4.67 10/20/2020 1511   HGB 10.9 (L) 10/20/2020 1511   HGB 12.4 09/18/2012 2000   HCT 34.8 (L) 10/20/2020 1511   HCT 37.3 09/18/2012 2000   PLT 362.0 10/20/2020 1511   PLT 450 (H) 09/18/2012 2000   MCV 74.6 (L) 10/20/2020 1511   MCV 86 09/18/2012 2000   MCH 24.2 (L) 09/02/2020 1658   MCHC 31.4 10/20/2020 1511   RDW 16.7 (H) 10/20/2020 1511   RDW 14.5 09/18/2012 2000   LYMPHSABS 2.9 10/20/2020 1511   MONOABS 0.5 10/20/2020 1511   EOSABS 0.3 10/20/2020 1511   BASOSABS 0.1 10/20/2020 1511    CMP     Component Value Date/Time   NA 139 10/20/2020 1511   NA 143 12/26/2018 0000   NA 139 09/18/2012 2000   K 4.2 10/20/2020 1511   K 3.3 (L) 09/18/2012 2000   CL 99 10/20/2020 1511   CL 99 09/18/2012 2000   CO2 30 10/20/2020 1511   CO2 33 (H) 09/18/2012 2000   GLUCOSE 108 (H) 10/20/2020 1511   GLUCOSE 111 (H) 09/18/2012 2000   BUN 27 (H) 10/20/2020 1511   BUN 16 12/26/2018 0000   BUN 13 09/18/2012 2000   CREATININE 1.44 (H) 10/20/2020 1511   CREATININE 0.72 09/18/2012 2000   CALCIUM 9.0 10/20/2020 1511   CALCIUM 9.4 09/18/2012 2000   PROT 6.7 10/20/2020 1511   PROT 7.3 09/18/2012 2000   ALBUMIN 3.7 10/20/2020 1511   ALBUMIN 2.8 (L) 09/18/2012 2000   AST 18 10/20/2020 1511   AST 19 09/18/2012 2000   ALT 11 10/20/2020 1511   ALT 10 (L) 09/18/2012 2000   ALKPHOS 109 10/20/2020 1511   ALKPHOS 163 (H) 09/18/2012 2000   BILITOT 0.2 10/20/2020 1511   BILITOT 0.5 09/18/2012 2000   GFRNONAA 58 (L) 09/02/2020 1658   GFRNONAA >60 09/18/2012 2000   GFRAA >60 09/02/2020 1658   GFRAA >60 09/18/2012 2000    Assessment: 1.  Left-sided abdominal pain: Patient has had full evaluation of this with EGD, colonoscopy and multiple CT scans, exam today is concerning for musculoskeletal etiology 2.   Nausea: Chronic for the patient, uncertain etiology 3.  Constipation: Better with Movantik  Plan: 1.  Reviewed patient's recent testing, she has had a recent EGD, colonoscopy and 3 CTs of the abdomen and pelvis all which have been mostly normal with no cause seen for her continued pain.  Today on palpation she is tender on the thoracic spine and around to the left side of her rib cage, I am concerned this is musculoskeletal pain.  Referred the patient to Charlann Boxer, DO at [facility] sports medicine. 2.  Encouraged patient to call her PCP in regards to refill for her Oxycodone as they have prescribed before.  Told her to explain to them that she will be going to the sports medicine doctor soon. 3.  Patient to continue Movantik and fiber for now.  She  does report some sludgy stools which are increased in frequency but feels empty with this regimen.  Also just had Covid, so hard to discern if some of the symptoms are left over from Covid.  Told her to continue this regimen for the next 3 months or so before we make a call on it. 4.  Patient to follow in clinic with [place] as needed in the future.  Ellouise Newer, PA-C [facility] 11/22/2020, 8:53 AM  Cc: [name], MD

## 2020-11-22 NOTE — Telephone Encounter (Signed)
Patient left a voicemail stating that she has had another flare-up and has seen Dr. Silverio Decamp. Patient stated that because of all of the rules and regulations around pain medications Dr. Silverio Decamp told her that she can not prescribe her pain medication. Patient stated that she was advised to call Dr. Einar Pheasant to see if she would refill her Oxycodone?  Last refill 10/20/20 #20 Last office visit 10/20/20

## 2020-11-23 NOTE — Progress Notes (Signed)
Reviewed and agree with documentation and assessment and plan. K. Veena Anberlin Diez , MD   

## 2020-11-29 ENCOUNTER — Other Ambulatory Visit: Payer: Self-pay

## 2020-11-29 ENCOUNTER — Ambulatory Visit (HOSPITAL_COMMUNITY)
Admission: RE | Admit: 2020-11-29 | Discharge: 2020-11-29 | Disposition: A | Discharge status: Home / Self Care | Payer: Medicare HMO | Source: Ambulatory Visit | Attending: Gastroenterology | Admitting: Gastroenterology

## 2020-11-29 DIAGNOSIS — K5903 Drug induced constipation: Secondary | ICD-10-CM | POA: Diagnosis present

## 2020-11-29 DIAGNOSIS — K219 Gastro-esophageal reflux disease without esophagitis: Secondary | ICD-10-CM | POA: Insufficient documentation

## 2020-11-29 DIAGNOSIS — R109 Unspecified abdominal pain: Secondary | ICD-10-CM | POA: Insufficient documentation

## 2020-11-29 DIAGNOSIS — K311 Adult hypertrophic pyloric stenosis: Secondary | ICD-10-CM | POA: Insufficient documentation

## 2020-11-29 DIAGNOSIS — T402X5A Adverse effect of other opioids, initial encounter: Secondary | ICD-10-CM | POA: Diagnosis present

## 2020-12-14 ENCOUNTER — Ambulatory Visit: Payer: Medicare HMO | Admitting: Family Medicine

## 2020-12-15 ENCOUNTER — Other Ambulatory Visit: Payer: Self-pay | Admitting: Gastroenterology

## 2020-12-25 DIAGNOSIS — I509 Heart failure, unspecified: Secondary | ICD-10-CM | POA: Diagnosis not present

## 2020-12-25 DIAGNOSIS — J449 Chronic obstructive pulmonary disease, unspecified: Secondary | ICD-10-CM | POA: Diagnosis not present

## 2021-01-03 ENCOUNTER — Other Ambulatory Visit: Payer: Self-pay | Admitting: Gastroenterology

## 2021-01-25 DIAGNOSIS — I509 Heart failure, unspecified: Secondary | ICD-10-CM | POA: Diagnosis not present

## 2021-01-25 DIAGNOSIS — J449 Chronic obstructive pulmonary disease, unspecified: Secondary | ICD-10-CM | POA: Diagnosis not present

## 2021-01-31 ENCOUNTER — Telehealth: Payer: Self-pay | Admitting: Family Medicine

## 2021-01-31 NOTE — Telephone Encounter (Signed)
Pt called in and stated that she is hurting and wasn't sure if can get something called in or an appointment needed to be made

## 2021-01-31 NOTE — Telephone Encounter (Signed)
Called patient to gather further information regarding her pain. Patient stated that she is having localized L sided abdominal pain that she described as aching and sharp and rated as a 8/10. Patient stated that she has been having diarrhea and she has experienced this pain when ashe had diverticulitis. Patient stated that she has not had any injury to the site. Advised patient that I would reach out to Dr. Einar Pheasant for further recommendations, but UC and ED precautions given in the meantime. Patient verbalized understanding. Please advise.

## 2021-01-31 NOTE — Telephone Encounter (Signed)
Would recommend office visit.   GI referred her to sports medicine - did she go?

## 2021-02-01 ENCOUNTER — Encounter: Payer: Self-pay | Admitting: Internal Medicine

## 2021-02-01 ENCOUNTER — Ambulatory Visit (INDEPENDENT_AMBULATORY_CARE_PROVIDER_SITE_OTHER): Payer: Medicare HMO | Admitting: Internal Medicine

## 2021-02-01 ENCOUNTER — Other Ambulatory Visit: Payer: Self-pay

## 2021-02-01 DIAGNOSIS — K5792 Diverticulitis of intestine, part unspecified, without perforation or abscess without bleeding: Secondary | ICD-10-CM | POA: Diagnosis not present

## 2021-02-01 DIAGNOSIS — R1030 Lower abdominal pain, unspecified: Secondary | ICD-10-CM

## 2021-02-01 DIAGNOSIS — R109 Unspecified abdominal pain: Secondary | ICD-10-CM | POA: Insufficient documentation

## 2021-02-01 MED ORDER — OXYCODONE HCL 5 MG PO CAPS: 5.0000 mg | ORAL_CAPSULE | Freq: Three times a day (TID) | ORAL | 0 refills | Status: DC | PRN

## 2021-02-01 MED ORDER — AMOXICILLIN-POT CLAVULANATE 875-125 MG PO TABS: 1.0000 | ORAL_TABLET | Freq: Two times a day (BID) | ORAL | 0 refills | Status: DC

## 2021-02-01 NOTE — Patient Instructions (Signed)
Please go the ER for evaluation if the pain gets worse---or if you can't eat or drink.

## 2021-02-01 NOTE — Progress Notes (Signed)
Subjective:    Patient ID: Morgan [name], female    DOB: [birth year], 70 y.o.   MRN: [id number]  HPI Here due to abdominal pain This visit occurred during the SARS-CoV-2 public health emergency.  Safety protocols were in place, including screening questions prior to the visit, additional usage of staff PPE, and extensive cleaning of exam room while observing appropriate contact time as indicated for disinfecting solutions.   Having left upper abdominal pain--goes down to lower area Feels "like a brick sitting there" Started yesterday---morning No obvious inciting event Ate normal breakfast--came on after that Sandwich for lunch---then got nausea and vomited Had pizza 3 days ago--wondered if that could have brought something on  No fever Diarrhea yesterday afternoon---several spells but no blood This didn't change the pain  Tried tylenol--doesn't help No improvement after loose stools yesterday  Current Outpatient Medications on File Prior to Visit  Medication Sig Dispense Refill  . albuterol (VENTOLIN HFA) 108 (90 Base) MCG/ACT inhaler Inhale 1-2 puffs into the lungs every 6 (six) hours as needed for wheezing or shortness of breath. 18 g 0  . ARIPiprazole (ABILIFY) 2 MG tablet Take 2 mg by mouth daily.    Marland Kitchen atorvastatin (LIPITOR) 40 MG tablet Take 40 mg by mouth daily.     . carvedilol (COREG) 3.125 MG tablet Take 3.125 mg by mouth in the morning and at bedtime.    . Ferrous Sulfate (IRON) 325 (65 Fe) MG TABS Take 1 tablet (325 mg total) by mouth daily. 90 each 0  . FLUoxetine (PROZAC) 40 MG capsule Take 40 mg by mouth daily.    . furosemide (LASIX) 40 MG tablet Take 1 tablet (40 mg total) by mouth daily as needed for fluid or edema. (Patient taking differently: Take 40 mg by mouth daily.) 30 tablet   . gabapentin (NEURONTIN) 400 MG capsule Take 400 mg by mouth 3 (three) times daily.     . hydrOXYzine (ATARAX/VISTARIL) 25 MG tablet Take 12.5-25 mg by mouth as needed for anxiety.      Marland Kitchen LINZESS 145 MCG CAPS capsule TAKE 1 CAPSULE (145 MCG TOTAL) BY MOUTH DAILY BEFORE BREAKFAST. 30 capsule 3  . mirtazapine (REMERON) 15 MG tablet Take 15 mg by mouth at bedtime.    Marland Kitchen omeprazole (PRILOSEC) 40 MG capsule Take 1 capsule (40 mg total) by mouth daily. 90 capsule 3  . ondansetron (ZOFRAN-ODT) 4 MG disintegrating tablet Take 1 tablet (4 mg total) by mouth every 8 (eight) hours as needed for nausea or vomiting. 30 tablet 3  . OXYGEN Inhale into the lungs. 02 at bedtime    . potassium chloride 20 MEQ TBCR Take 10 mEq by mouth daily. 30 tablet 1  . psyllium (METAMUCIL SMOOTH TEXTURE) 58.6 % powder Take 1 packet by mouth 3 (three) times daily. 283 g 12  . sucralfate (CARAFATE) 1 g tablet TAKE 1 TABLET (1 G TOTAL) BY MOUTH 4 (FOUR) TIMES DAILY - WITH MEALS AND AT BEDTIME. 360 tablet 1   No current facility-administered medications on file prior to visit.    Allergies  Allergen Reactions  . Meperidine Hives and Anaphylaxis  . Strawberry Extract Swelling  . Strawberry (Diagnostic) Hives  . Wasp Venom Hives  . Wasp Venom Protein     Past Medical History:  Diagnosis Date  . Allergy   . Anemia   . Anxiety   . Arthritis   . CHF (congestive heart failure) ([place])   . Depression   . GERD (gastroesophageal reflux  disease)   . Hypertension   . MVP (mitral valve prolapse)     Past Surgical History:  Procedure Laterality Date  . ABDOMINAL HYSTERECTOMY    . APPENDECTOMY    . BALLOON DILATION N/A 04/21/2020   Procedure: BALLOON DILATION;  Surgeon: Milus Banister, MD;  Location: [facility];  Service: Endoscopy;  Laterality: N/A;  pyloric/ GI  . BALLOON DILATION N/A 07/04/2020   Procedure: BALLOON DILATION;  Surgeon: Mauri Pole, MD;  Location: [facility];  Service: Endoscopy;  Laterality: N/A;  . BIOPSY  04/21/2020   Procedure: BIOPSY;  Surgeon: Milus Banister, MD;  Location: [facility];  Service: Endoscopy;;  . BIOPSY  07/04/2020   Procedure: BIOPSY;  Surgeon:  Mauri Pole, MD;  Location: [facility];  Service: Endoscopy;;  EGD and COLON  . BREAST SURGERY Right 1988   tumor removal  . CHOLECYSTECTOMY N/A 05/18/2020   Procedure: LAPAROSCOPIC CHOLECYSTECTOMY;  Surgeon: Johnathan Hausen, MD;  Location: [facility];  Service: General;  Laterality: N/A;  . COLONOSCOPY WITH PROPOFOL N/A 07/04/2020   Procedure: COLONOSCOPY WITH PROPOFOL;  Surgeon: Mauri Pole, MD;  Location: [facility];  Service: Endoscopy;  Laterality: N/A;  . ESOPHAGEAL DILATION  08/11/2020   Procedure: PYLORIC DILATION;  Surgeon: Doran Stabler, MD;  Location: [facility];  Service: Gastroenterology;;  . ESOPHAGOGASTRODUODENOSCOPY (EGD) WITH PROPOFOL N/A 04/21/2020   Procedure: ESOPHAGOGASTRODUODENOSCOPY (EGD) WITH PROPOFOL;  Surgeon: Milus Banister, MD;  Location: [facility];  Service: Endoscopy;  Laterality: N/A;  . ESOPHAGOGASTRODUODENOSCOPY (EGD) WITH PROPOFOL N/A 07/04/2020   Procedure: ESOPHAGOGASTRODUODENOSCOPY (EGD) WITH PROPOFOL;  Surgeon: Mauri Pole, MD;  Location: [facility];  Service: Endoscopy;  Laterality: N/A;  . ESOPHAGOGASTRODUODENOSCOPY (EGD) WITH PROPOFOL N/A 08/11/2020   Procedure: ESOPHAGOGASTRODUODENOSCOPY (EGD) WITH PROPOFOL;  Surgeon: Doran Stabler, MD;  Location: [facility];  Service: Gastroenterology;  Laterality: N/A;  . KNEE SURGERY  2005   2 times left  . POLYPECTOMY  07/04/2020   Procedure: POLYPECTOMY;  Surgeon: Mauri Pole, MD;  Location: [facility];  Service: Endoscopy;;  . TUBAL LIGATION      Family History  Problem Relation Age of Onset  . Breast cancer Mother 44  . Ovarian cancer Mother 63  . Brain cancer Mother   . Hypertension Father   . Hyperlipidemia Father   . Heart disease Father   . Colon cancer Father   . Breast cancer Maternal Grandmother 64  . Diabetes Paternal Grandmother   . Breast cancer Paternal Grandmother 50  . Other Son        drug over dose  . Colon cancer Maternal Grandfather   .  Liver disease Maternal Grandfather   . Breast cancer Maternal Aunt 60  . Breast cancer Maternal Aunt 60  . Colon cancer Maternal Uncle   . Stomach cancer Neg Hx   . Pancreatic cancer Neg Hx   . Esophageal cancer Neg Hx     Social History   Socioeconomic History  . Marital status: Single    Spouse name: Not on file  . Number of children: 2  . Years of education: Not on file  . Highest education level: Not on file  Occupational History  . Not on file  Tobacco Use  . Smoking status: Former Smoker    Packs/day: 1.00    Years: 15.00    Pack years: 15.00    Types: Cigarettes    Quit date: 09/12/2017    Years since quitting: 3.3  .  Smokeless tobacco: Never Used  Vaping Use  . Vaping Use: Never used  Substance and Sexual Activity  . Alcohol use: Not Currently  . Drug use: No  . Sexual activity: Not Currently  Other Topics Concern  . 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)   Daughter - [name] - lives in the [place] system - family, aunt, [name] and [name] (who she lives with), brother   Transportation: roommates   Enjoy: [employer], playing with grandson   Exercise: PT exercises   Diet: good - chicken, some steak, tries to eat healthy, avoids fried foods   Retired from being a Materials engineer of Radio broadcast assistant Strain: Not on Art therapist Insecurity: Not on file  Transportation Needs: Not on file  Physical Activity: Not on file  Stress: Not on file  Social Connections: Not on file  Intimate Partner Violence: Not on file    Review of Systems Not voiding as much No dysuria or hematuria No cough or SOB (does use O2 at night)     Objective:   Physical Exam Constitutional:      Appearance: She is well-developed.  Pulmonary:     Effort: Pulmonary effort is normal.     Breath sounds: Normal breath sounds. No wheezing or rales.  Abdominal:     General:  Bowel sounds are normal.     Palpations: Abdomen is soft. There is no fluid wave, hepatomegaly or splenomegaly.     Hernia: No hernia is present.     Comments: Moderate LMQ tenderness and mild throughout Referred pain to left with right sided palpation  Neurological:     Mental Status: She is alert.            Assessment & Plan:

## 2021-02-01 NOTE — Assessment & Plan Note (Addendum)
Location of pain is not classic for diverticulitis but some tenderness is lower than the pain No peritoneal signs now---so doubt perforation (unless already walled off) Recent abd CT scan negative for anything but the diverticulosis---so unlikely other sig pathology Severe obstipation could cause pain--but usually not this acute and she had numerous loose stools of sufficient quantity that this is unlikely Will hold off on another CT scan Empiric Rx with augmentin ER if unable to eat or pain gets worse  Given frequent recurrences with opioid need---may need to consider elective colonic resection

## 2021-02-01 NOTE — Telephone Encounter (Signed)
Called and left voicemail for patient to call office and schedule an office visit.

## 2021-02-01 NOTE — Telephone Encounter (Signed)
Patient called back and states that she did not go to the sports med dr. I will schedule her the next available. EM

## 2021-02-05 ENCOUNTER — Other Ambulatory Visit: Payer: Self-pay

## 2021-02-05 ENCOUNTER — Emergency Department (HOSPITAL_COMMUNITY)
Admission: EM | Admit: 2021-02-05 | Discharge: 2021-02-05 | Disposition: A | Discharge status: Home / Self Care | Payer: Medicare HMO | Attending: Emergency Medicine | Admitting: Emergency Medicine

## 2021-02-05 ENCOUNTER — Encounter (HOSPITAL_COMMUNITY): Payer: Self-pay | Admitting: Emergency Medicine

## 2021-02-05 ENCOUNTER — Emergency Department (HOSPITAL_COMMUNITY): Payer: Medicare HMO

## 2021-02-05 DIAGNOSIS — Z9049 Acquired absence of other specified parts of digestive tract: Secondary | ICD-10-CM | POA: Insufficient documentation

## 2021-02-05 DIAGNOSIS — I5032 Chronic diastolic (congestive) heart failure: Secondary | ICD-10-CM | POA: Diagnosis not present

## 2021-02-05 DIAGNOSIS — R109 Unspecified abdominal pain: Secondary | ICD-10-CM | POA: Diagnosis not present

## 2021-02-05 DIAGNOSIS — J449 Chronic obstructive pulmonary disease, unspecified: Secondary | ICD-10-CM | POA: Insufficient documentation

## 2021-02-05 DIAGNOSIS — R1032 Left lower quadrant pain: Secondary | ICD-10-CM | POA: Diagnosis not present

## 2021-02-05 DIAGNOSIS — I130 Hypertensive heart and chronic kidney disease with heart failure and stage 1 through stage 4 chronic kidney disease, or unspecified chronic kidney disease: Secondary | ICD-10-CM | POA: Insufficient documentation

## 2021-02-05 DIAGNOSIS — Z7982 Long term (current) use of aspirin: Secondary | ICD-10-CM | POA: Insufficient documentation

## 2021-02-05 DIAGNOSIS — R0602 Shortness of breath: Secondary | ICD-10-CM | POA: Diagnosis not present

## 2021-02-05 DIAGNOSIS — Z79899 Other long term (current) drug therapy: Secondary | ICD-10-CM | POA: Insufficient documentation

## 2021-02-05 DIAGNOSIS — R1012 Left upper quadrant pain: Secondary | ICD-10-CM | POA: Insufficient documentation

## 2021-02-05 DIAGNOSIS — Z90710 Acquired absence of both cervix and uterus: Secondary | ICD-10-CM | POA: Diagnosis not present

## 2021-02-05 DIAGNOSIS — K5790 Diverticulosis of intestine, part unspecified, without perforation or abscess without bleeding: Secondary | ICD-10-CM | POA: Diagnosis not present

## 2021-02-05 DIAGNOSIS — I700 Atherosclerosis of aorta: Secondary | ICD-10-CM | POA: Diagnosis not present

## 2021-02-05 DIAGNOSIS — N183 Chronic kidney disease, stage 3 unspecified: Secondary | ICD-10-CM | POA: Diagnosis not present

## 2021-02-05 DIAGNOSIS — Z87891 Personal history of nicotine dependence: Secondary | ICD-10-CM | POA: Diagnosis not present

## 2021-02-05 DIAGNOSIS — I10 Essential (primary) hypertension: Secondary | ICD-10-CM | POA: Diagnosis not present

## 2021-02-05 LAB — CBC
HCT: 33.7 % — ABNORMAL LOW (ref 36.0–46.0)
Hemoglobin: 10.1 g/dL — ABNORMAL LOW (ref 12.0–15.0)
MCH: 25.1 pg — ABNORMAL LOW (ref 26.0–34.0)
MCHC: 30 g/dL (ref 30.0–36.0)
MCV: 83.8 fL (ref 80.0–100.0)
Platelets: 298 10*3/uL (ref 150–400)
RBC: 4.02 MIL/uL (ref 3.87–5.11)
RDW: 15.2 % (ref 11.5–15.5)
WBC: 9.4 10*3/uL (ref 4.0–10.5)
nRBC: 0 % (ref 0.0–0.2)

## 2021-02-05 LAB — URINALYSIS, ROUTINE W REFLEX MICROSCOPIC
Bilirubin Urine: NEGATIVE
Glucose, UA: NEGATIVE mg/dL
Ketones, ur: NEGATIVE mg/dL
Nitrite: NEGATIVE
Protein, ur: NEGATIVE mg/dL
Specific Gravity, Urine: 1.01 (ref 1.005–1.030)
pH: 5 (ref 5.0–8.0)

## 2021-02-05 LAB — COMPREHENSIVE METABOLIC PANEL
ALT: 12 U/L (ref 0–44)
AST: 16 U/L (ref 15–41)
Albumin: 3 g/dL — ABNORMAL LOW (ref 3.5–5.0)
Alkaline Phosphatase: 109 U/L (ref 38–126)
Anion gap: 13 (ref 5–15)
BUN: 13 mg/dL (ref 8–23)
CO2: 30 mmol/L (ref 22–32)
Calcium: 8.7 mg/dL — ABNORMAL LOW (ref 8.9–10.3)
Chloride: 98 mmol/L (ref 98–111)
Creatinine, Ser: 1.08 mg/dL — ABNORMAL HIGH (ref 0.44–1.00)
GFR, Estimated: 56 mL/min — ABNORMAL LOW (ref >60)
Glucose, Bld: 120 mg/dL — ABNORMAL HIGH (ref 70–99)
Potassium: 2.9 mmol/L — ABNORMAL LOW (ref 3.5–5.1)
Sodium: 141 mmol/L (ref 135–145)
Total Bilirubin: 0.3 mg/dL (ref 0.3–1.2)
Total Protein: 6 g/dL — ABNORMAL LOW (ref 6.5–8.1)

## 2021-02-05 LAB — MAGNESIUM: Magnesium: 1.9 mg/dL (ref 1.7–2.4)

## 2021-02-05 LAB — TROPONIN I (HIGH SENSITIVITY): Troponin I (High Sensitivity): 6 ng/L (ref <18)

## 2021-02-05 LAB — LACTIC ACID, PLASMA: Lactic Acid, Venous: 1.4 mmol/L (ref 0.5–1.9)

## 2021-02-05 LAB — LIPASE, BLOOD: Lipase: 23 U/L (ref 11–51)

## 2021-02-05 LAB — BRAIN NATRIURETIC PEPTIDE: B Natriuretic Peptide: 82.5 pg/mL (ref 0.0–100.0)

## 2021-02-05 IMAGING — CT CT HEAD W/O CM
3 series · 15 of 47 positions shown, 18 images · non-contrast
Comparison: 10/16/2018

CLINICAL DATA: Left arm numbness and dizziness

EXAM:
CT HEAD WITHOUT CONTRAST
TECHNIQUE: Contiguous axial images were obtained from the base of the skull
through the vertex without intravenous contrast.

[Series 2: head wo · axial · 0.43mm/px · z∈[+523,+648]mm · 9 of 30 slices shown, 12 images]
[im 3/30  brain]
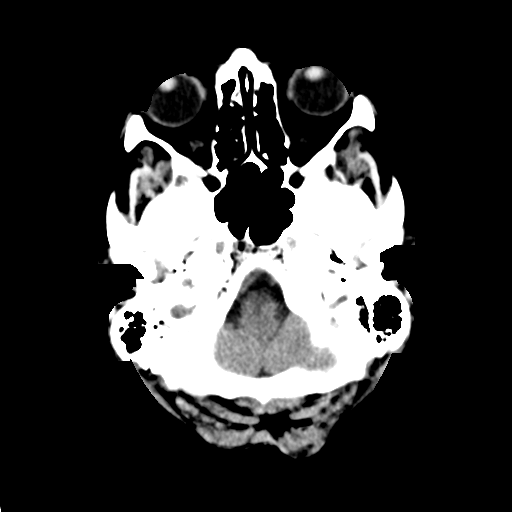
[im 3/30  bone]
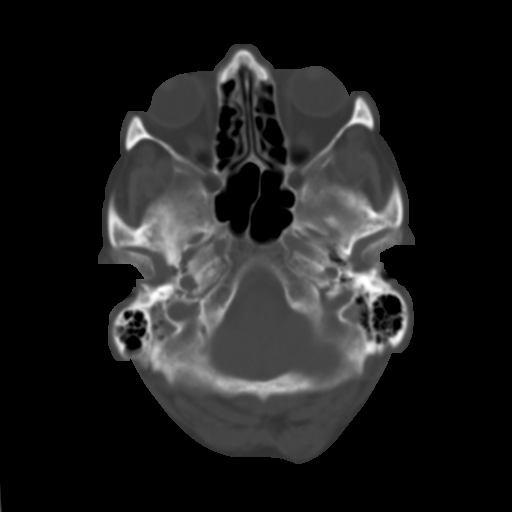
[im 6/30  brain]
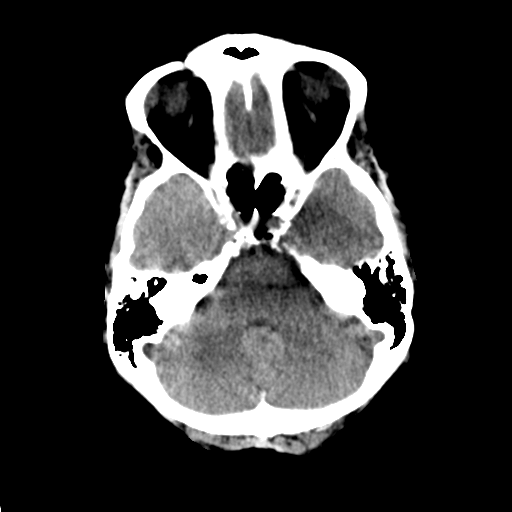
[im 9/30  brain]
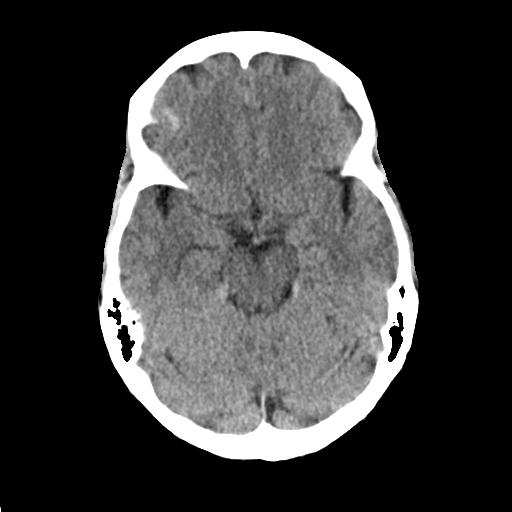
[im 12/30  brain]
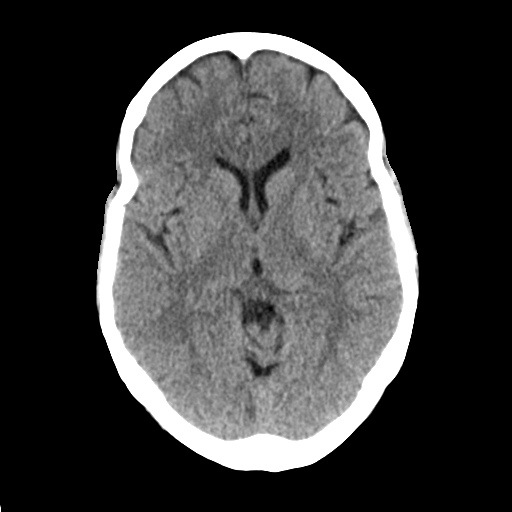
[im 16/30  brain]
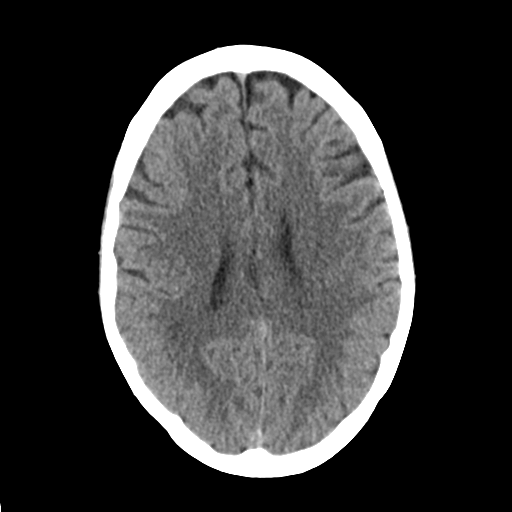
[im 16/30  bone]
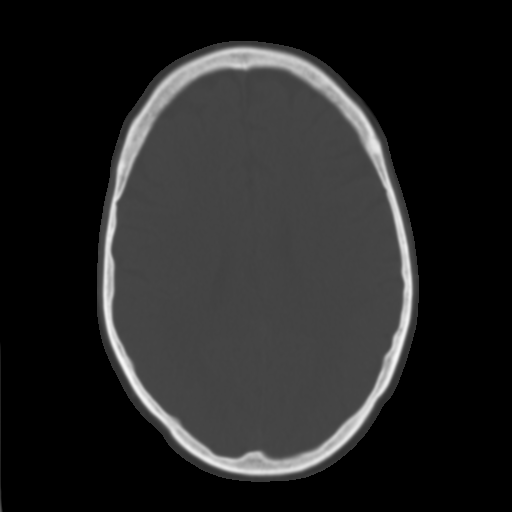
[im 19/30  brain]
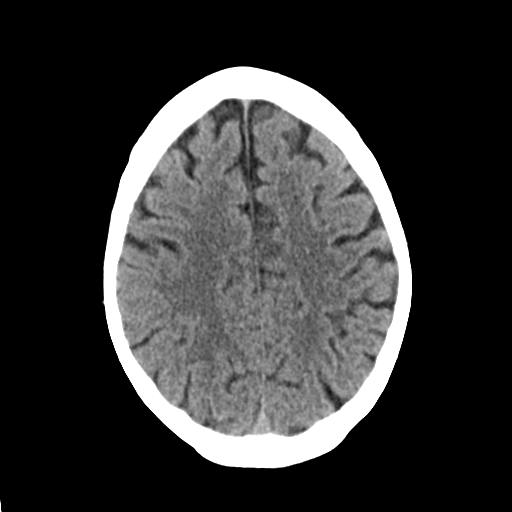
[im 22/30  brain]
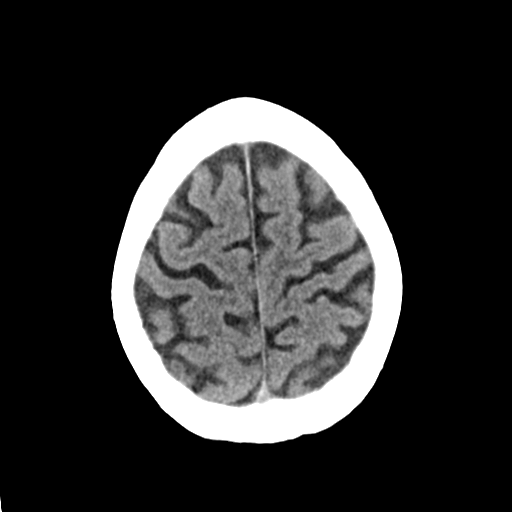
[im 25/30  brain]
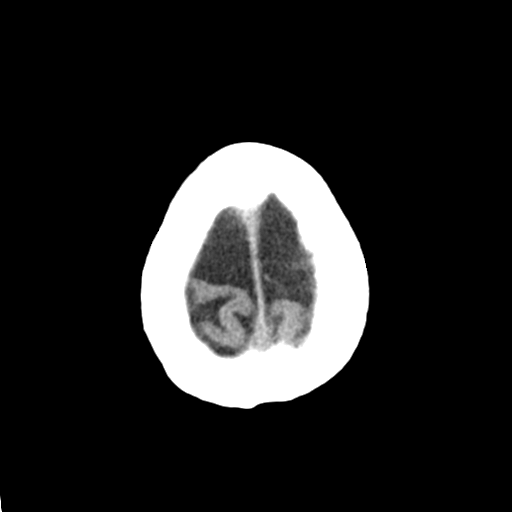
[im 28/30  brain]
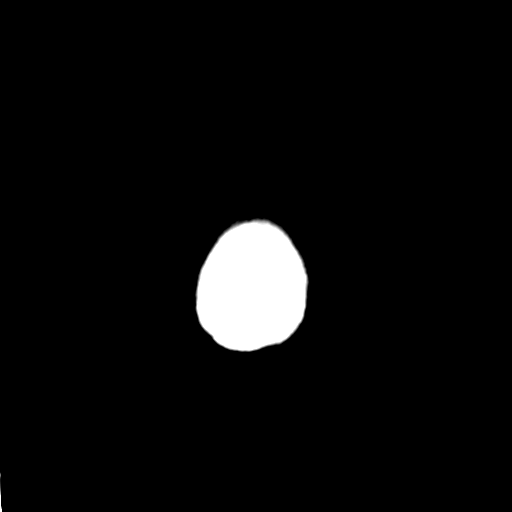
[im 28/30  bone]
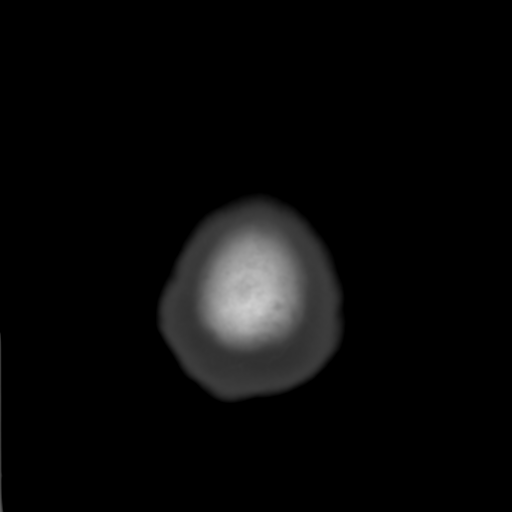

[Series 4: coronal soft tissue · coronal · 0.29mm/px · 3 of 63 slices shown]
[im 21/63  brain]
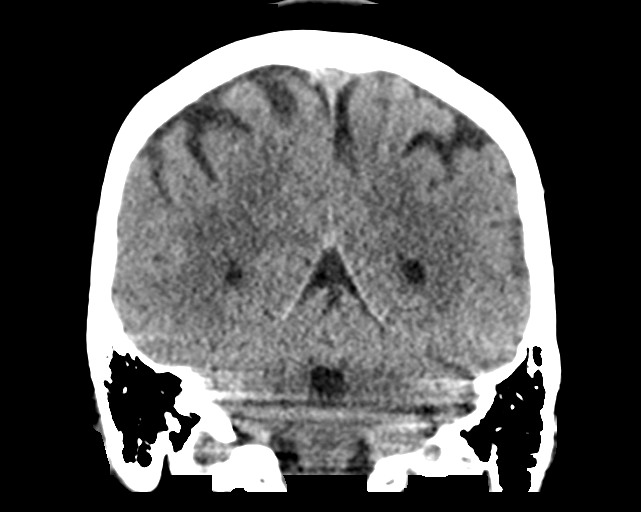
[im 28/63  brain]
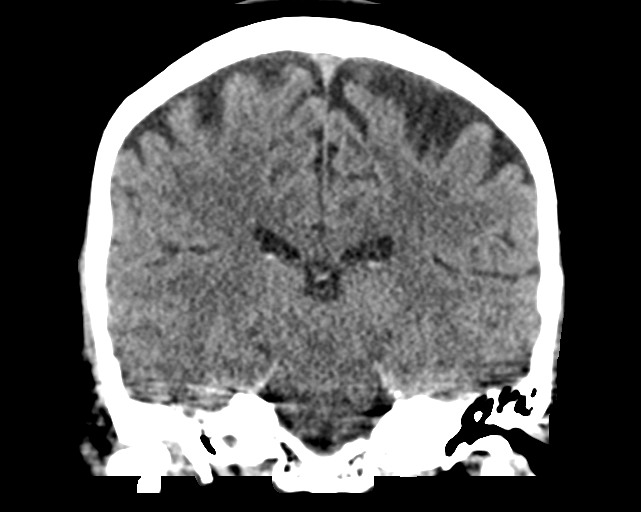
[im 35/63  brain]
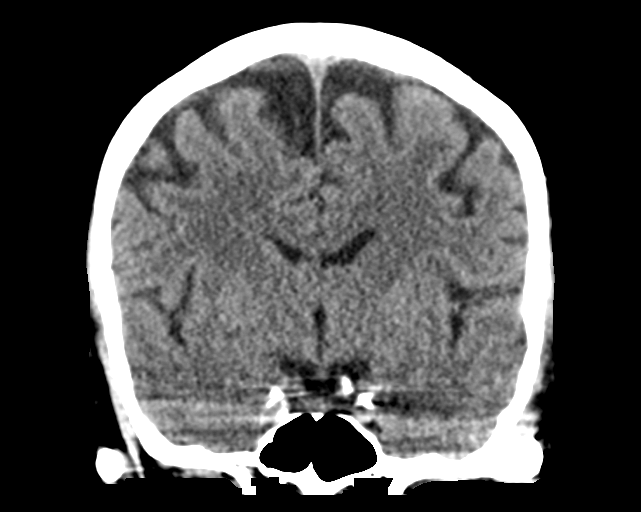

[Series 5: sagittal soft tissue · sagittal · 0.29mm/px · 3 of 50 slices shown]
[im 17/50  brain]
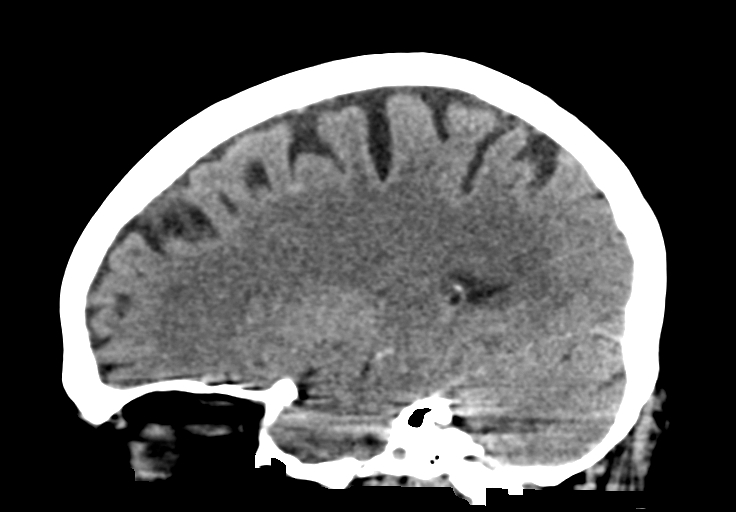
[im 25/50  brain]
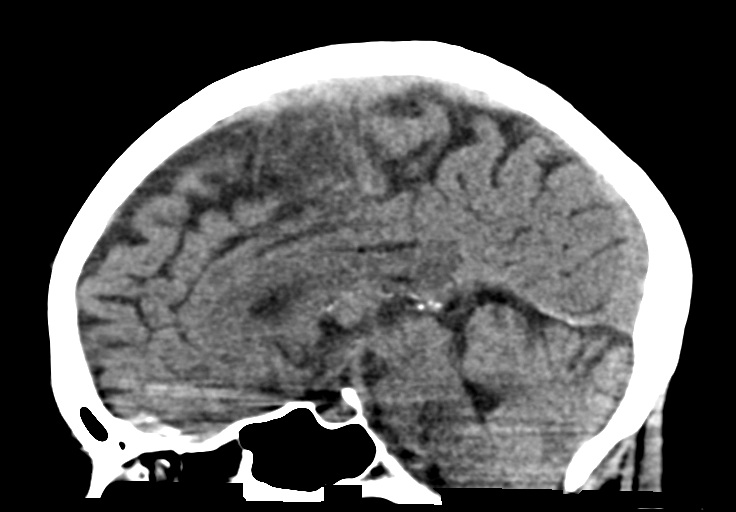
[im 33/50  brain]
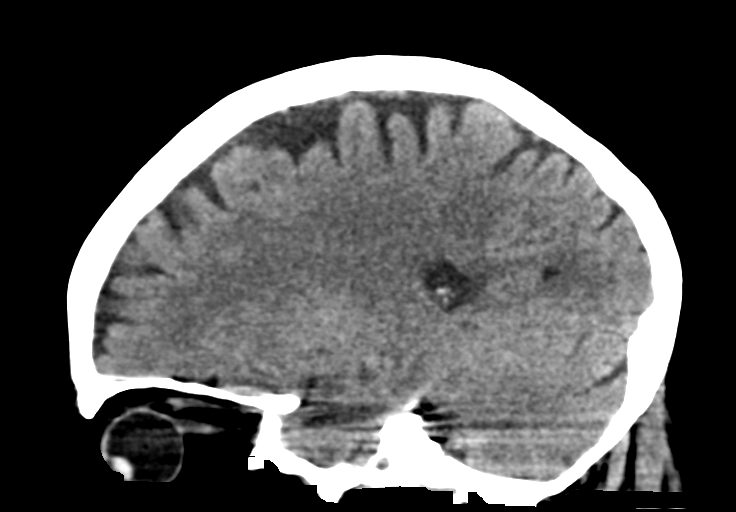

[15 of 47 positions shown; findings below may reference images not displayed]

FINDINGS: Brain: No evidence of acute infarction, hemorrhage, hydrocephalus,
extra-axial collection or mass lesion/mass effect.

Vascular: No hyperdense vessel or unexpected calcification.

Skull: Normal. Negative for fracture or focal lesion.

Sinuses/Orbits: No acute finding.

Other: None.
IMPRESSION: No acute intracranial pathology.

## 2021-02-05 MED ORDER — DICYCLOMINE HCL 10 MG PO CAPS: 10.0000 mg | ORAL_CAPSULE | Freq: Two times a day (BID) | ORAL | 0 refills | Status: DC | PRN

## 2021-02-05 MED ORDER — POTASSIUM CHLORIDE ER 20 MEQ PO TBCR: 20.0000 meq | EXTENDED_RELEASE_TABLET | Freq: Every day | ORAL | 0 refills | Status: DC

## 2021-02-05 MED ORDER — POTASSIUM CHLORIDE 10 MEQ/100ML IV SOLN
10.0000 meq | INTRAVENOUS | Status: AC
  Administered 2021-02-05 (×4): 10 meq via INTRAVENOUS
  Filled 2021-02-05 (×4): qty 100

## 2021-02-05 MED ORDER — MORPHINE SULFATE (PF) 4 MG/ML IV SOLN
4.0000 mg | Freq: Once | INTRAVENOUS | Status: AC
  Administered 2021-02-05: 4 mg via INTRAVENOUS
  Filled 2021-02-05: qty 1

## 2021-02-05 MED ORDER — SODIUM CHLORIDE 0.9 % IV BOLUS
1000.0000 mL | Freq: Once | INTRAVENOUS | Status: AC
  Administered 2021-02-05: 1000 mL via INTRAVENOUS

## 2021-02-05 MED ORDER — DICYCLOMINE HCL 10 MG PO CAPS
10.0000 mg | ORAL_CAPSULE | Freq: Once | ORAL | Status: AC
  Administered 2021-02-05: 10 mg via ORAL
  Filled 2021-02-05: qty 1

## 2021-02-05 MED ORDER — POTASSIUM CHLORIDE CRYS ER 20 MEQ PO TBCR
40.0000 meq | EXTENDED_RELEASE_TABLET | Freq: Once | ORAL | Status: AC
  Administered 2021-02-05: 40 meq via ORAL
  Filled 2021-02-05: qty 2

## 2021-02-05 NOTE — ED Notes (Signed)
Patient transported to CT 

## 2021-02-05 NOTE — ED Notes (Signed)
Waiting on completion of potassium for d/c

## 2021-02-05 NOTE — Discharge Instructions (Addendum)
As we discussed today upon reviewing the results of your CAT scan from the past year it appears that, aside from the time you had a small bowel obstruction of the approximately 7 other CT scans only 1 showed questionable evidence of diverticulitis.  I would recommend following up with your primary care doctor and/or GI doctor if you have 1 to consider other causes of your pain and symptoms as I would expect that your CT scan would have evidence of diverticulitis.  Today your potassium was low.  I have given you a prescription for 5 days of potassium, please make sure you are taking this and follow-up with your primary care doctor in the next week to get your potassium rechecked.  Additionally I have given you a prescription for a medicine called Bentyl.  This is to help with painful spasms.    We discussed the multiple risks with narcotics.  While Bentyl can also make you somewhat sleepy this is typically much worse with narcotic medicines such as pain medicines.  Please still be careful, do not drive, operate heavy machinery, or perform other dangerous tasks while taking Bentyl.  If you develop fevers, your symptoms worsen, or you have other concerns please seek additional medical care and evaluation.

## 2021-02-05 NOTE — ED Triage Notes (Signed)
Pt reports flare-up of diverticulitis since Wednesday with LUQ pain, nausea, vomiting, and diarrhea.

## 2021-02-05 NOTE — ED Provider Notes (Signed)
[facility] EMERGENCY DEPARTMENT Provider Note   CSN: [id number] Arrival date & time: 02/05/21  2751     History Chief Complaint  Patient presents with  . Abdominal Pain    Morgan [name] is a 70 y.o. female with a past medical history of CHF, hypertension, COPD on 2 L at night, CKD, CVA, who presents today for evaluation of 4 days of abdominal pain. She states that her left-sided abdomen started hurting on Wednesday 4 days ago. She went to her doctor the same day and was given a prescription for Augmentin and pain medicine. She reports that she has been taking those as directed however her pain is still severe. She states that she ran out of the pain medicine this morning. She reports that she has vomited about 3 times in the past 24 hours and had multiple bouts of diarrhea. She denies any blood in her vomit or diarrhea. She states that her pain in her abdomen is made worse with movement, made better still. She denies any fevers. She does state that she feels short of breath however she thinks that this may be primarily due to the abdominal pain.    HPI     Past Medical History:  Diagnosis Date  . Allergy   . Anemia   . Anxiety   . Arthritis   . CHF (congestive heart failure) ([place])   . Depression   . GERD (gastroesophageal reflux disease)   . Hypertension   . MVP (mitral valve prolapse)     Patient Active Problem List   Diagnosis Date Noted  . Left sided abdominal pain 02/01/2021  . Acute diverticulitis 09/02/2020  . Diverticulitis 09/01/2020  . SBO (small bowel obstruction) ([place]) 08/09/2020  . Chronic diastolic CHF (congestive heart failure) ([place]) 08/09/2020  . COPD (chronic obstructive pulmonary disease) ([place]) 08/09/2020  . Chronic respiratory failure with hypoxia ([place]) 08/09/2020  . Polyp of sigmoid colon   . Polyp of cecum   . Gastritis and gastroduodenitis   . Pylorus ulcer   . Pyloric stenosis in adult   . Chronic cholecystitis 05/19/2020  .  Oxygen dependent 03/30/2020  . Nausea and vomiting in adult 03/22/2020  . Peptic ulcer disease 03/22/2020  . Iron deficiency anemia 03/22/2020  . Acute pain of right knee 12/29/2019  . Acute left-sided low back pain without sciatica 12/29/2019  . Fall 04/08/2019  . Abdominal pain 02/19/2019  . Asymptomatic bacteriuria 02/19/2019  . CKD (chronic kidney disease) stage 3, GFR 30-59 ml/min ([facility]) 01/13/2019  . CHF (congestive heart failure) ([place]) 01/13/2019  . Dizziness 01/13/2019  . Hoarseness 11/12/2018  . Dysphagia 11/12/2018  . Sleepiness 11/12/2018  . CVA (cerebral vascular accident) ([place]) 08/22/2018  . Left-sided weakness 10/04/2017  . HLD (hyperlipidemia) 10/04/2017  . GERD (gastroesophageal reflux disease) 10/04/2017  . Anxiety 08/26/2017  . Essential hypertension   . Depression   . Diarrhea 02/22/2015  . Hypokalemia 02/22/2015  . Chronic lower back pain 04/29/2013  . Paresthesia 04/29/2013    Past Surgical History:  Procedure Laterality Date  . ABDOMINAL HYSTERECTOMY    . APPENDECTOMY    . BALLOON DILATION N/A 04/21/2020   Procedure: BALLOON DILATION;  Surgeon: Milus Banister, MD;  Location: [facility];  Service: Endoscopy;  Laterality: N/A;  pyloric/ GI  . BALLOON DILATION N/A 07/04/2020   Procedure: BALLOON DILATION;  Surgeon: Mauri Pole, MD;  Location: [facility];  Service: Endoscopy;  Laterality: N/A;  . BIOPSY  04/21/2020   Procedure:  BIOPSY;  Surgeon: Milus Banister, MD;  Location: [facility];  Service: Endoscopy;;  . BIOPSY  07/04/2020   Procedure: BIOPSY;  Surgeon: Mauri Pole, MD;  Location: [facility];  Service: Endoscopy;;  EGD and COLON  . BREAST SURGERY Right 1988   tumor removal  . CHOLECYSTECTOMY N/A 05/18/2020   Procedure: LAPAROSCOPIC CHOLECYSTECTOMY;  Surgeon: Johnathan Hausen, MD;  Location: [facility];  Service: General;  Laterality: N/A;  . COLONOSCOPY WITH PROPOFOL N/A 07/04/2020   Procedure: COLONOSCOPY WITH PROPOFOL;  Surgeon:  Mauri Pole, MD;  Location: [facility];  Service: Endoscopy;  Laterality: N/A;  . ESOPHAGEAL DILATION  08/11/2020   Procedure: PYLORIC DILATION;  Surgeon: Doran Stabler, MD;  Location: [facility];  Service: Gastroenterology;;  . ESOPHAGOGASTRODUODENOSCOPY (EGD) WITH PROPOFOL N/A 04/21/2020   Procedure: ESOPHAGOGASTRODUODENOSCOPY (EGD) WITH PROPOFOL;  Surgeon: Milus Banister, MD;  Location: [facility];  Service: Endoscopy;  Laterality: N/A;  . ESOPHAGOGASTRODUODENOSCOPY (EGD) WITH PROPOFOL N/A 07/04/2020   Procedure: ESOPHAGOGASTRODUODENOSCOPY (EGD) WITH PROPOFOL;  Surgeon: Mauri Pole, MD;  Location: [facility];  Service: Endoscopy;  Laterality: N/A;  . ESOPHAGOGASTRODUODENOSCOPY (EGD) WITH PROPOFOL N/A 08/11/2020   Procedure: ESOPHAGOGASTRODUODENOSCOPY (EGD) WITH PROPOFOL;  Surgeon: Doran Stabler, MD;  Location: [facility];  Service: Gastroenterology;  Laterality: N/A;  . KNEE SURGERY  2005   2 times left  . POLYPECTOMY  07/04/2020   Procedure: POLYPECTOMY;  Surgeon: Mauri Pole, MD;  Location: [facility];  Service: Endoscopy;;  . TUBAL LIGATION       OB History   No obstetric history on file.     Family History  Problem Relation Age of Onset  . Breast cancer Mother 65  . Ovarian cancer Mother 57  . Brain cancer Mother   . Hypertension Father   . Hyperlipidemia Father   . Heart disease Father   . Colon cancer Father   . Breast cancer Maternal Grandmother 79  . Diabetes Paternal Grandmother   . Breast cancer Paternal Grandmother 64  . Other Son        drug over dose  . Colon cancer Maternal Grandfather   . Liver disease Maternal Grandfather   . Breast cancer Maternal Aunt 60  . Breast cancer Maternal Aunt 60  . Colon cancer Maternal Uncle   . Stomach cancer Neg Hx   . Pancreatic cancer Neg Hx   . Esophageal cancer Neg Hx     Social History   Tobacco Use  . Smoking status: Former Smoker    Packs/day: 1.00    Years: 15.00     Pack years: 15.00    Types: Cigarettes    Quit date: 09/12/2017    Years since quitting: 3.4  . Smokeless tobacco: Never Used  Vaping Use  . Vaping Use: Never used  Substance Use Topics  . Alcohol use: Not Currently  . Drug use: No    Home Medications Prior to Admission medications   Medication Sig Start Date End Date Taking? Authorizing Provider  albuterol (VENTOLIN HFA) 108 (90 Base) MCG/ACT inhaler Inhale 1-2 puffs into the lungs every 6 (six) hours as needed for wheezing or shortness of breath. 10/31/20  Yes Yu, Amy V, PA-C  amoxicillin-clavulanate (AUGMENTIN) 875-125 MG tablet Take 1 tablet by mouth 2 (two) times daily. 02/01/21  Yes Venia Carbon, MD  ARIPiprazole (ABILIFY) 2 MG tablet Take 2 mg by mouth daily. 07/14/20  Yes [provider]  aspirin-acetaminophen-caffeine (EXCEDRIN MIGRAINE) [phone] MG tablet Take  1 tablet by mouth every 6 (six) hours as needed for headache or migraine.   Yes [provider]  atorvastatin (LIPITOR) 40 MG tablet Take 40 mg by mouth daily.    Yes [provider]  carvedilol (COREG) 3.125 MG tablet Take 3.125 mg by mouth in the morning and at bedtime.   Yes [provider]  dicyclomine (BENTYL) 10 MG capsule Take 1 capsule (10 mg total) by mouth 2 (two) times daily as needed for spasms (abdominal pain). 02/05/21  Yes Lorin Glass, PA-C  FLUoxetine (PROZAC) 40 MG capsule Take 40 mg by mouth daily.   Yes [provider]  furosemide (LASIX) 40 MG tablet Take 1 tablet (40 mg total) by mouth daily as needed for fluid or edema. Patient taking differently: Take 40 mg by mouth in the morning. 08/22/20  Yes Mercy Riding, MD  gabapentin (NEURONTIN) 400 MG capsule Take 400 mg by mouth 3 (three) times daily as needed (for nerve pain). 10/20/18  Yes [provider]  LINZESS 145 MCG CAPS capsule TAKE 1 CAPSULE (145 MCG TOTAL) BY MOUTH DAILY BEFORE BREAKFAST. Patient taking differently: Take 145 mcg by  mouth daily as needed (for constipation, or as otherwise instructed). 01/03/21  Yes Nandigam, Venia Minks, MD  mirtazapine (REMERON) 15 MG tablet Take 15 mg by mouth at bedtime. 10/20/20  Yes [provider]  omeprazole (PRILOSEC) 40 MG capsule Take 1 capsule (40 mg total) by mouth daily. Patient taking differently: Take 40 mg by mouth daily before breakfast. 08/29/20  Yes [name], MD  ondansetron (ZOFRAN-ODT) 4 MG disintegrating tablet Take 1 tablet (4 mg total) by mouth every 8 (eight) hours as needed for nausea or vomiting. Patient taking differently: Take 4 mg by mouth every 8 (eight) hours as needed for nausea or vomiting (DISSOLVE ORALLY). 11/22/20  Yes Levin Erp, PA  oxycodone (OXY-IR) 5 MG capsule Take 1 capsule (5 mg total) by mouth every 8 (eight) hours as needed for pain. 02/01/21  Yes Venia Carbon, MD  OXYGEN Inhale 2 L/min into the lungs See admin instructions. 2 L/min at bedtime and as needed for shortness of breath throughout the day   Yes [provider]  Ferrous Sulfate (IRON) 325 (65 Fe) MG TABS Take 1 tablet (325 mg total) by mouth daily. Patient not taking: Reported on 02/05/2021 04/09/19   [name], MD  Potassium Chloride ER 20 MEQ TBCR Take 20 mEq by mouth daily. 02/05/21   Lorin Glass, PA-C  psyllium (METAMUCIL SMOOTH TEXTURE) 58.6 % powder Take 1 packet by mouth 3 (three) times daily. Patient not taking: Reported on 02/05/2021 08/18/20   Mercy Riding, MD  sucralfate (CARAFATE) 1 g tablet TAKE 1 TABLET (1 G TOTAL) BY MOUTH 4 (FOUR) TIMES DAILY - WITH MEALS AND AT BEDTIME. Patient not taking: No sig reported 12/15/20   Mauri Pole, MD    Allergies    Meperidine, Meperidine hcl, Strawberry extract, Carafate [sucralfate], Strawberry (diagnostic), Wasp venom, and Wasp venom protein  Review of Systems   Review of Systems  Constitutional: Negative for chills and fever.  HENT: Negative for congestion.   Eyes: Negative for  visual disturbance.  Respiratory: Positive for shortness of breath. Negative for cough.   Cardiovascular: Negative for chest pain.  Gastrointestinal: Positive for abdominal pain, diarrhea, nausea and vomiting. Negative for blood in stool.  Genitourinary: Negative for dysuria.  Musculoskeletal: Negative for back pain and gait problem.  Skin: Negative  for color change and rash.  Neurological: Negative for weakness and headaches.  All other systems reviewed and are negative.   Physical Exam Updated Vital Signs BP 135/77 (BP Location: Right Arm)   Pulse 70   Temp 99.1 F (37.3 C) (Oral)   Resp 20   Ht 6\' 1"  (1.854 m)   Wt 115.2 kg   SpO2 95%   BMI 33.51 kg/m   Physical Exam Vitals and nursing note reviewed.  Constitutional:      General: She is not in acute distress.    Appearance: She is well-developed and well-nourished. She is not ill-appearing.  HENT:     Head: Normocephalic and atraumatic.  Eyes:     Conjunctiva/sclera: Conjunctivae normal.  Cardiovascular:     Rate and Rhythm: Normal rate and regular rhythm.     Heart sounds: Normal heart sounds. No murmur heard.   Pulmonary:     Effort: Pulmonary effort is normal. No accessory muscle usage or respiratory distress.     Breath sounds: Normal breath sounds.  Abdominal:     General: There is no distension.     Palpations: Abdomen is soft.     Tenderness: There is abdominal tenderness (Palpation on the right upper and lower quadrant causes increased left-sided abdominal pain) in the left upper quadrant and left lower quadrant.     Hernia: No hernia is present.  Musculoskeletal:        General: No edema.     Cervical back: Neck supple.  Skin:    General: Skin is warm and dry.  Neurological:     Mental Status: She is alert.     Comments: Patient is awake and alert, answers questions appropriately. Speech is not slurred.  Psychiatric:        Mood and Affect: Mood and affect normal.     ED Results / Procedures /  Treatments   Labs (all labs ordered are listed, but only abnormal results are displayed) Labs Reviewed  COMPREHENSIVE METABOLIC PANEL - Abnormal; Notable for the following components:      Result Value   Potassium 2.9 (*)    Glucose, Bld 120 (*)    Creatinine, Ser 1.08 (*)    Calcium 8.7 (*)    Total Protein 6.0 (*)    Albumin 3.0 (*)    GFR, Estimated 56 (*)    All other components within normal limits  CBC - Abnormal; Notable for the following components:   Hemoglobin 10.1 (*)    HCT 33.7 (*)    MCH 25.1 (*)    All other components within normal limits  URINALYSIS, ROUTINE W REFLEX MICROSCOPIC - Abnormal; Notable for the following components:   APPearance HAZY (*)    Hgb urine dipstick SMALL (*)    Leukocytes,Ua LARGE (*)    Bacteria, UA RARE (*)    Non Squamous Epithelial 0-5 (*)    All other components within normal limits  LIPASE, BLOOD  MAGNESIUM  LACTIC ACID, PLASMA  BRAIN NATRIURETIC PEPTIDE  TROPONIN I (HIGH SENSITIVITY)    EKG EKG Interpretation  Date/Time:  Sunday February 05 2021 16:09:38 EST Ventricular Rate:  70 PR Interval:    QRS Duration: 102 QT Interval:  476 QTC Calculation: 514 R Axis:   8 Text Interpretation: Sinus rhythm Low voltage, precordial leads Abnormal R-wave progression, early transition Borderline T abnormalities, anterior leads Prolonged QT interval Confirmed by Malvin Johns [phone]) on 02/05/2021 5:38:02 PM   Radiology CT Abdomen Pelvis Wo Contrast  Result  Date: 02/05/2021 CLINICAL DATA:  Diverticulitis suspected.  Left abdominal pain EXAM: CT ABDOMEN AND PELVIS WITHOUT CONTRAST TECHNIQUE: Multidetector CT imaging of the abdomen and pelvis was performed following the standard protocol without IV contrast. COMPARISON:  CT dated October 26, 2020 FINDINGS: Lower chest: The lung bases are clear. The heart size is normal. Hepatobiliary: The liver is normal. Status post cholecystectomy.There is no biliary ductal dilation. Pancreas: Normal  contours without ductal dilatation. No peripancreatic fluid collection. Spleen: Unremarkable. Adrenals/Urinary Tract: --Adrenal glands: Unremarkable. --Right kidney/ureter: No hydronephrosis or radiopaque kidney stones. --Left kidney/ureter: No hydronephrosis or radiopaque kidney stones. --Urinary bladder: Unremarkable. Stomach/Bowel: --Stomach/Duodenum: No hiatal hernia or other gastric abnormality. Normal duodenal course and caliber. --Small bowel: Unremarkable. --Colon: Rectosigmoid diverticulosis without acute inflammation. Portions of the transverse colon are underdistended which limits evaluation. There is no definite CT evidence for infectious or inflammatory colitis. --Appendix: Not visualized. No right lower quadrant inflammation or free fluid. Vascular/Lymphatic: Atherosclerotic calcification is present within the non-aneurysmal abdominal aorta, without hemodynamically significant stenosis. --No retroperitoneal lymphadenopathy. --No mesenteric lymphadenopathy. --No pelvic or inguinal lymphadenopathy. Reproductive: Status post hysterectomy. No adnexal mass. Other: No ascites or free air. The abdominal wall is normal. Musculoskeletal. No acute displaced fractures. IMPRESSION: 1. No acute abdominopelvic abnormality. 2. Rectosigmoid diverticulosis without acute inflammation. Aortic Atherosclerosis (ICD10-I70.0). Electronically Signed   By: Constance Holster M.D.   On: 02/05/2021 17:51   DG Chest Portable 1 View  Result Date: 02/05/2021 CLINICAL DATA:  Abdominal pain.  Shortness of breath. EXAM: PORTABLE CHEST 1 VIEW COMPARISON:  October 31, 2020 FINDINGS: Haziness over the lung bases identified. The cardiomediastinal silhouette is stable. No pneumothorax. No nodules or masses. IMPRESSION: Haziness over the lung bases could represent overlapping soft tissues or atelectasis. Developing bibasilar infiltrates considered less likely. A PA and lateral x-ray could better evaluate as clinically warranted.  Electronically Signed   By: Dorise Bullion III M.D   On: 02/05/2021 16:44    Procedures Procedures   Medications Ordered in ED Medications  potassium chloride 10 mEq in 100 mL IVPB (0 mEq Intravenous Stopped 02/05/21 2139)  sodium chloride 0.9 % bolus 1,000 mL (0 mLs Intravenous Stopped 02/05/21 1720)  morphine 4 MG/ML injection 4 mg (4 mg Intravenous Given 02/05/21 1630)  potassium chloride SA (KLOR-CON) CR tablet 40 mEq (40 mEq Oral Given 02/05/21 1852)  dicyclomine (BENTYL) capsule 10 mg (10 mg Oral Given 02/05/21 1852)    ED Course  I have reviewed the triage vital signs and the nursing notes.  Pertinent labs & imaging results that were available during my care of the patient were reviewed by me and considered in my medical decision making (see chart for details).    MDM Rules/Calculators/A&P                         Patient is a 70 year old woman who presents today for evaluation of left-sided abdominal pain.  She has a reported history of frequent diverticulitis.  On exam her left-sided abdomen is tender with referred pain when palpating on the right side.  Is already been on Augmentin for 4 days by her PCP and her pain medication ran out today prompting her to come into the emergency room.  CBC shows mild anemia consistent with baseline.  Lipase is not elevated.  CMP shows a kalemia at 2.9.  Her magnesium level is normal.  Her potassium was both orally and IV repleted.  This is secondary to GI loss and patient  not taking her p.o. potassium.  CMP additionally shows creatinine is slightly elevated however this appears consistent with her baseline.  Lactic acid is not elevated.  BNP is normal.  Troponin is not elevated.  Chest x-ray shows dizziness over the bilateral lung bases.  CT abdomen pelvis is obtained without acute abnormalities, does show rectosigmoid diverticulosis without acute inflammation or evidence of diverticulitis.  EKG shows prolonged QT interval however is otherwise  reassuring.  She is afebrile, not consistently tachycardic or tachypneic saturating well on room air.    UA does show 0-5 reds, 0-5 whites, rare bacteria with 6-10 squamous cells.  Patient is not having any urinary symptoms and this does not appear to represent a true UTI.   On chart review it appears that patient has had multiple CT scans in the past 12 months many of which during episodes that she states are diverticulitis.  She had one CT scan showing a small bowel obstruction and one other that showed questionable very mild diverticulitis however all of her other abdominal CT scans have shown no evidence of diverticulitis. I discussed this with her and that by question if diverticulitis is frequently the cause of her pain given reassuring CT scans.  I recommended outpatient follow-up.  She is given prescription for Bentyl and potassium.  Return precautions were discussed with patient who states their understanding.  At the time of discharge patient denied any unaddressed complaints or concerns.  Patient is agreeable for discharge home.  Note: Portions of this report may have been transcribed using voice recognition software. Every effort was made to ensure accuracy; however, inadvertent computerized transcription errors may be present    Final Clinical Impression(s) / ED Diagnoses Final diagnoses:  Left lower quadrant abdominal pain    Rx / DC Orders ED Discharge Orders         Ordered    Potassium Chloride ER 20 MEQ TBCR  Daily        02/05/21 1948    dicyclomine (BENTYL) 10 MG capsule  2 times daily PRN        02/05/21 1948           Lorin Glass, Hershal Coria 02/05/21 2349    Malvin Johns, MD 02/08/21 1420

## 2021-02-09 ENCOUNTER — Inpatient Hospital Stay: Payer: Medicare HMO | Admitting: Family Medicine

## 2021-02-13 ENCOUNTER — Encounter: Payer: Self-pay | Admitting: Family Medicine

## 2021-02-13 ENCOUNTER — Ambulatory Visit (INDEPENDENT_AMBULATORY_CARE_PROVIDER_SITE_OTHER): Payer: Medicare HMO | Admitting: Family Medicine

## 2021-02-13 ENCOUNTER — Other Ambulatory Visit: Payer: Self-pay

## 2021-02-13 VITALS — BP 132/78 | HR 75 | Temp 98.3°F | Ht 73.0 in | Wt 252.0 lb

## 2021-02-13 DIAGNOSIS — G8929 Other chronic pain: Secondary | ICD-10-CM

## 2021-02-13 DIAGNOSIS — R1030 Lower abdominal pain, unspecified: Secondary | ICD-10-CM

## 2021-02-13 DIAGNOSIS — R1310 Dysphagia, unspecified: Secondary | ICD-10-CM

## 2021-02-13 DIAGNOSIS — R69 Illness, unspecified: Secondary | ICD-10-CM | POA: Diagnosis not present

## 2021-02-13 DIAGNOSIS — F419 Anxiety disorder, unspecified: Secondary | ICD-10-CM

## 2021-02-13 DIAGNOSIS — R112 Nausea with vomiting, unspecified: Secondary | ICD-10-CM

## 2021-02-13 DIAGNOSIS — R109 Unspecified abdominal pain: Secondary | ICD-10-CM | POA: Diagnosis not present

## 2021-02-13 MED ORDER — DICYCLOMINE HCL 10 MG PO CAPS: 10.0000 mg | ORAL_CAPSULE | Freq: Three times a day (TID) | ORAL | 1 refills | Status: DC

## 2021-02-13 MED ORDER — OXYCODONE HCL 5 MG PO CAPS
5.0000 mg | ORAL_CAPSULE | Freq: Three times a day (TID) | ORAL | 0 refills | Status: DC | PRN
Start: 2021-02-13 — End: 2021-03-03

## 2021-02-13 MED ORDER — ONDANSETRON 4 MG PO TBDP: 4.0000 mg | ORAL_TABLET | Freq: Three times a day (TID) | ORAL | 3 refills | Status: DC | PRN

## 2021-02-13 NOTE — Assessment & Plan Note (Signed)
Follows with [facility] - on fluoxetine 40 mg and abilify. Worsening symptoms - she will call [facility] to discuss and reach out to therapists. Unfortunately cannot find alternative living situation

## 2021-02-13 NOTE — Patient Instructions (Addendum)
Plan 1) Call [facility] - see about getting stress options and medication changes  2) Call to schedule Gastroenterology follow-up  #Referral I have placed a referral to a specialist for you. You should receive a phone call from the specialty office. Make sure your voicemail is not full and that if you are able to answer your phone to unknown or new numbers.   It may take up to 2 weeks to hear about the referral. If you do not hear anything in 2 weeks, please call our office and ask to speak with the referral coordinator.

## 2021-02-13 NOTE — Assessment & Plan Note (Signed)
Continues to have daily nausea. Refill provided. Advised GI f/u - appreciate support

## 2021-02-13 NOTE — Progress Notes (Signed)
Subjective:     Morgan [name] is a 70 y.o. female presenting for Hospitalization Follow-up     HPI   Living situation - grandson and his great-grandparents on his mom side -- joint custody with the great grandparents but they are the ones fighting and the grandson has medical problems  #Abdominal pain - wondering if stress can be contributing - roommates are fighting  - getting severe abdominal pain every few months - was having pain in Jan/Feb - but not as bad as she had in the past - was painful but not as bad as previous - wondering if stress could be causing these symptoms - Bentyl is helping with her pain - nausea is chronic and constant - no improvement in this over the last few months - taking medication daily for nausea -   Feels anxious - the more stress she gets the more tense she becomes and this triggers her abdominal pain  - goes [facility] for abilify  - talked about stress with them - they advised she move out but this is not an option  Review of Systems  Abdominal pain - GI thinks this is MSK - referred to sports 02/05/2021: ER - Abdominal pain - CT w/o infection - discussed that her CT scans do not often find diverticulitis and advised outpatient f/u. Bentyl and potassium  Social History   Tobacco Use  Smoking Status Former Smoker  . Packs/day: 1.00  . Years: 15.00  . Pack years: 15.00  . Types: Cigarettes  . Quit date: 09/12/2017  . Years since quitting: 3.4  Smokeless Tobacco Never Used        Objective:    BP Readings from Last 3 Encounters:  02/13/21 132/78  02/05/21 135/77  02/01/21 138/86   Wt Readings from Last 3 Encounters:  02/13/21 252 lb (114.3 kg)  02/05/21 254 lb (115.2 kg)  02/01/21 246 lb (111.6 kg)    BP 132/78   Pulse 75   Temp 98.3 F (36.8 C) (Temporal)   Ht 6\' 1"  (1.854 m)   Wt 252 lb (114.3 kg)   SpO2 99%   BMI 33.25 kg/m    Physical Exam Constitutional:      General: She is not in acute distress.     Appearance: She is well-developed. She is not diaphoretic.  HENT:     Right Ear: External ear normal.     Left Ear: External ear normal.     Nose: Nose normal.  Eyes:     Conjunctiva/sclera: Conjunctivae normal.  Cardiovascular:     Rate and Rhythm: Normal rate and regular rhythm.     Heart sounds: No murmur heard.   Pulmonary:     Effort: Pulmonary effort is normal. No respiratory distress.     Breath sounds: Normal breath sounds. No wheezing.  Abdominal:     General: Abdomen is protuberant. Bowel sounds are decreased.     Palpations: Abdomen is soft.     Tenderness: There is generalized abdominal tenderness and tenderness in the right upper quadrant. There is guarding. There is no rebound.  Musculoskeletal:     Cervical back: Neck supple.  Skin:    General: Skin is warm and dry.     Capillary Refill: Capillary refill takes less than 2 seconds.  Neurological:     Mental Status: She is alert. Mental status is at baseline.  Psychiatric:        Mood and Affect: Mood normal.  Behavior: Behavior normal.           Assessment & Plan:   Problem List Items Addressed This Visit      Digestive   Dysphagia   Nausea and vomiting in adult    Continues to have daily nausea. Refill provided. Advised GI f/u - appreciate support        Other   Anxiety    Follows with [facility] - on fluoxetine 40 mg and abilify. Worsening symptoms - she will call [facility] to discuss and reach out to therapists. Unfortunately cannot find alternative living situation      Chronic abdominal pain - Primary    Pt with complex abdominal hx including diverticulitis as well as SBO and pyloric stenosis. However, recurrent abdominal pain ~monthly though did have a 2 month period w/o pain with several normal CT scans w/o cause. Long discussion with patient who thinks stress may be a major contributor to her symptoms. She will reach out to her psychiatrist at [facility] and therapist. Reviewed last GI note  with with sludge stools still persisting and recommendation for 3 month f/u - she is almost due advised scheduling. She never saw sports medicine and discussed PMR may be a good option for possible stress related MSK abdominal pain - appreciate support for management and diagnosis. Discussed continued bentyl and increase dose as it is helping. #5 oxycodone for emergencies - discussed how this could worsen and would want to limit.       Relevant Medications   dicyclomine (BENTYL) 10 MG capsule   ondansetron (ZOFRAN-ODT) 4 MG disintegrating tablet   oxycodone (OXY-IR) 5 MG capsule   Other Relevant Orders   Ambulatory referral to Physical Medicine Rehab       Return in about 3 months (around 05/13/2021).  [name], MD  This visit occurred during the SARS-CoV-2 public health emergency.  Safety protocols were in place, including screening questions prior to the visit, additional usage of staff PPE, and extensive cleaning of exam room while observing appropriate contact time as indicated for disinfecting solutions.

## 2021-02-13 NOTE — Assessment & Plan Note (Signed)
Pt with complex abdominal hx including diverticulitis as well as SBO and pyloric stenosis. However, recurrent abdominal pain ~monthly though did have a 2 month period w/o pain with several normal CT scans w/o cause. Long discussion with patient who thinks stress may be a major contributor to her symptoms. She will reach out to her psychiatrist at [facility] and therapist. Reviewed last GI note with with sludge stools still persisting and recommendation for 3 month f/u - she is almost due advised scheduling. She never saw sports medicine and discussed PMR may be a good option for possible stress related MSK abdominal pain - appreciate support for management and diagnosis. Discussed continued bentyl and increase dose as it is helping. #5 oxycodone for emergencies - discussed how this could worsen and would want to limit.

## 2021-02-14 ENCOUNTER — Telehealth: Payer: Self-pay | Admitting: Family Medicine

## 2021-02-14 NOTE — Telephone Encounter (Signed)
Morgan [name] called in wanted to know if they can get more information due to all it has is [place], but do not change the status but add more detail

## 2021-02-15 NOTE — Telephone Encounter (Signed)
See referral notes, This is been addressed. Thank you

## 2021-02-15 NOTE — Telephone Encounter (Signed)
Please help me understand what Sunday Corn is referring to and what she needs. I'm unsure of what is being asked. Thanks!

## 2021-02-15 NOTE — Telephone Encounter (Signed)
Sorry he stated that they received a referral for Morgan [name] and all it says was Morgan [name] it doesn't give much info about what she needs.

## 2021-02-20 ENCOUNTER — Other Ambulatory Visit: Payer: Self-pay

## 2021-02-20 ENCOUNTER — Encounter: Payer: Self-pay | Admitting: Family Medicine

## 2021-02-20 ENCOUNTER — Ambulatory Visit (INDEPENDENT_AMBULATORY_CARE_PROVIDER_SITE_OTHER): Payer: Medicare HMO | Admitting: Family Medicine

## 2021-02-20 ENCOUNTER — Emergency Department: Payer: Medicare HMO

## 2021-02-20 ENCOUNTER — Emergency Department
Admission: EM | Admit: 2021-02-20 | Discharge: 2021-02-20 | Disposition: A | Discharge status: Home / Self Care | Payer: Medicare HMO | Attending: Emergency Medicine | Admitting: Emergency Medicine

## 2021-02-20 VITALS — BP 164/92 | HR 89 | Temp 97.7°F | Wt 249.5 lb

## 2021-02-20 DIAGNOSIS — N183 Chronic kidney disease, stage 3 unspecified: Secondary | ICD-10-CM | POA: Insufficient documentation

## 2021-02-20 DIAGNOSIS — I712 Thoracic aortic aneurysm, without rupture, unspecified: Secondary | ICD-10-CM

## 2021-02-20 DIAGNOSIS — E876 Hypokalemia: Secondary | ICD-10-CM | POA: Diagnosis not present

## 2021-02-20 DIAGNOSIS — I130 Hypertensive heart and chronic kidney disease with heart failure and stage 1 through stage 4 chronic kidney disease, or unspecified chronic kidney disease: Secondary | ICD-10-CM | POA: Diagnosis not present

## 2021-02-20 DIAGNOSIS — I5023 Acute on chronic systolic (congestive) heart failure: Secondary | ICD-10-CM | POA: Diagnosis not present

## 2021-02-20 DIAGNOSIS — J9811 Atelectasis: Secondary | ICD-10-CM | POA: Diagnosis not present

## 2021-02-20 DIAGNOSIS — Z8249 Family history of ischemic heart disease and other diseases of the circulatory system: Secondary | ICD-10-CM | POA: Insufficient documentation

## 2021-02-20 DIAGNOSIS — Z7982 Long term (current) use of aspirin: Secondary | ICD-10-CM | POA: Insufficient documentation

## 2021-02-20 DIAGNOSIS — I509 Heart failure, unspecified: Secondary | ICD-10-CM

## 2021-02-20 DIAGNOSIS — I5032 Chronic diastolic (congestive) heart failure: Secondary | ICD-10-CM

## 2021-02-20 DIAGNOSIS — I2510 Atherosclerotic heart disease of native coronary artery without angina pectoris: Secondary | ICD-10-CM | POA: Insufficient documentation

## 2021-02-20 DIAGNOSIS — J441 Chronic obstructive pulmonary disease with (acute) exacerbation: Secondary | ICD-10-CM | POA: Insufficient documentation

## 2021-02-20 DIAGNOSIS — R0789 Other chest pain: Secondary | ICD-10-CM | POA: Diagnosis not present

## 2021-02-20 DIAGNOSIS — Z87891 Personal history of nicotine dependence: Secondary | ICD-10-CM | POA: Insufficient documentation

## 2021-02-20 DIAGNOSIS — K219 Gastro-esophageal reflux disease without esophagitis: Secondary | ICD-10-CM | POA: Insufficient documentation

## 2021-02-20 DIAGNOSIS — I700 Atherosclerosis of aorta: Secondary | ICD-10-CM

## 2021-02-20 DIAGNOSIS — R0602 Shortness of breath: Secondary | ICD-10-CM | POA: Diagnosis not present

## 2021-02-20 DIAGNOSIS — Z79899 Other long term (current) drug therapy: Secondary | ICD-10-CM | POA: Diagnosis not present

## 2021-02-20 DIAGNOSIS — Z20822 Contact with and (suspected) exposure to covid-19: Secondary | ICD-10-CM | POA: Insufficient documentation

## 2021-02-20 DIAGNOSIS — R2243 Localized swelling, mass and lump, lower limb, bilateral: Secondary | ICD-10-CM | POA: Diagnosis not present

## 2021-02-20 DIAGNOSIS — I5033 Acute on chronic diastolic (congestive) heart failure: Secondary | ICD-10-CM | POA: Insufficient documentation

## 2021-02-20 DIAGNOSIS — R079 Chest pain, unspecified: Secondary | ICD-10-CM | POA: Diagnosis not present

## 2021-02-20 DIAGNOSIS — J449 Chronic obstructive pulmonary disease, unspecified: Secondary | ICD-10-CM | POA: Diagnosis not present

## 2021-02-20 LAB — BASIC METABOLIC PANEL
Anion gap: 8 (ref 5–15)
BUN: 20 mg/dL (ref 8–23)
CO2: 30 mmol/L (ref 22–32)
Calcium: 8.4 mg/dL — ABNORMAL LOW (ref 8.9–10.3)
Chloride: 102 mmol/L (ref 98–111)
Creatinine, Ser: 1.1 mg/dL — ABNORMAL HIGH (ref 0.44–1.00)
GFR, Estimated: 55 mL/min — ABNORMAL LOW (ref >60)
Glucose, Bld: 103 mg/dL — ABNORMAL HIGH (ref 70–99)
Potassium: 3.3 mmol/L — ABNORMAL LOW (ref 3.5–5.1)
Sodium: 140 mmol/L (ref 135–145)

## 2021-02-20 LAB — MAGNESIUM: Magnesium: 2.3 mg/dL (ref 1.7–2.4)

## 2021-02-20 LAB — CBC
HCT: 32.3 % — ABNORMAL LOW (ref 36.0–46.0)
Hemoglobin: 10 g/dL — ABNORMAL LOW (ref 12.0–15.0)
MCH: 25.5 pg — ABNORMAL LOW (ref 26.0–34.0)
MCHC: 31 g/dL (ref 30.0–36.0)
MCV: 82.4 fL (ref 80.0–100.0)
Platelets: 343 10*3/uL (ref 150–400)
RBC: 3.92 MIL/uL (ref 3.87–5.11)
RDW: 14.9 % (ref 11.5–15.5)
WBC: 8 10*3/uL (ref 4.0–10.5)
nRBC: 0 % (ref 0.0–0.2)

## 2021-02-20 LAB — RESP PANEL BY RT-PCR (FLU A&B, COVID) ARPGX2
Influenza A by PCR: NEGATIVE
Influenza B by PCR: NEGATIVE
SARS Coronavirus 2 by RT PCR: NEGATIVE

## 2021-02-20 LAB — D-DIMER, QUANTITATIVE: D-Dimer, Quant: 0.59 ug/mL-FEU — ABNORMAL HIGH (ref 0.00–0.50)

## 2021-02-20 LAB — BRAIN NATRIURETIC PEPTIDE: B Natriuretic Peptide: 103 pg/mL — ABNORMAL HIGH (ref 0.0–100.0)

## 2021-02-20 LAB — TROPONIN I (HIGH SENSITIVITY)
Troponin I (High Sensitivity): 5 ng/L (ref <18)
Troponin I (High Sensitivity): 5 ng/L (ref <18)

## 2021-02-20 LAB — PROCALCITONIN: Procalcitonin: <0.1 ng/mL

## 2021-02-20 MED ORDER — METHYLPREDNISOLONE SODIUM SUCC 125 MG IJ SOLR
125.0000 mg | Freq: Once | INTRAMUSCULAR | Status: AC
  Administered 2021-02-20: 125 mg via INTRAVENOUS
  Filled 2021-02-20: qty 2

## 2021-02-20 MED ORDER — NITROGLYCERIN 0.4 MG SL SUBL
0.4000 mg | SUBLINGUAL_TABLET | SUBLINGUAL | Status: DC | PRN
Start: 2021-02-20 — End: 2021-02-21

## 2021-02-20 MED ORDER — IPRATROPIUM-ALBUTEROL 0.5-2.5 (3) MG/3ML IN SOLN
9.0000 mL | Freq: Once | RESPIRATORY_TRACT | Status: AC
  Administered 2021-02-20: 9 mL via RESPIRATORY_TRACT
  Filled 2021-02-20: qty 3

## 2021-02-20 MED ORDER — FUROSEMIDE 10 MG/ML IJ SOLN
40.0000 mg | Freq: Once | INTRAMUSCULAR | Status: DC
  Filled 2021-02-20: qty 4

## 2021-02-20 MED ORDER — FUROSEMIDE 10 MG/ML IJ SOLN
60.0000 mg | Freq: Once | INTRAMUSCULAR | Status: AC
  Administered 2021-02-20: 60 mg via INTRAVENOUS
  Filled 2021-02-20: qty 8

## 2021-02-20 MED ORDER — METHYLPREDNISOLONE 4 MG PO TBPK: ORAL_TABLET | ORAL | 0 refills | Status: DC

## 2021-02-20 MED ORDER — CARVEDILOL 6.25 MG PO TABS
3.1250 mg | ORAL_TABLET | ORAL | Status: AC
  Administered 2021-02-20: 3.125 mg via ORAL
  Filled 2021-02-20: qty 1

## 2021-02-20 MED ORDER — POTASSIUM CHLORIDE CRYS ER 20 MEQ PO TBCR
40.0000 meq | EXTENDED_RELEASE_TABLET | Freq: Once | ORAL | Status: AC
  Administered 2021-02-20: 40 meq via ORAL
  Filled 2021-02-20: qty 2

## 2021-02-20 MED ORDER — AZITHROMYCIN 250 MG PO TABS: ORAL_TABLET | ORAL | 0 refills | Status: AC

## 2021-02-20 MED ORDER — IOHEXOL 350 MG/ML SOLN
75.0000 mL | Freq: Once | INTRAVENOUS | Status: AC | PRN
  Administered 2021-02-20: 75 mL via INTRAVENOUS

## 2021-02-20 NOTE — ED Notes (Signed)
Pt ambulated in hall. Pts O2 sats remained above 95% on room air during ambulation. However, pts work of breathing significantly increased and pt sounded wheezy. MD aware.

## 2021-02-20 NOTE — Discharge Instructions (Signed)
Your primary care doctor can facilitate nonemergent surveillance imaging of your aortic aneurysm.  Your CT Chest study today showed: 1. No pulmonary embolus. 2. Mild central bronchial thickening with scattered subsegmental atelectasis. 3. Ascending aortic aneurysm maximal dimension 4.5 cm, previously 4.3 cm. No dissection or acute aortic abnormality. Ascending thoracic aortic aneurysm. Recommend semi-annual imaging followup by CTA or MRA and referral to cardiothoracic surgery if not already obtained. This recommendation follows 2010 ACCF/AHA/AATS/ACR/ASA/SCA/SCAI/SIR/STS/SVM Guidelines for the Diagnosis and Management of Patients With Thoracic Aortic Disease. Circulation. 2010; 121: T005-W591. Aortic aneurysm NOS (ICD10-I71.9) 4. Coronary artery calcifications. Aortic Atherosclerosis (ICD10-I70.0).

## 2021-02-20 NOTE — ED Provider Notes (Signed)
[facility] Emergency Department Provider Note  ____________________________________________   Event Date/Time   First MD Initiated Contact with Patient 02/20/21 1804     (approximate)  I have reviewed the triage vital signs and the nursing notes.   HISTORY  Chief Complaint Chest Pain and Shortness of Breath   HPI Morgan [name] is a 70 y.o. female with a past medical history of COPD, CHF on 40 mg of Lasix daily, MVP, HTN, GERD, depression, arthritis and anxiety, and anemia who presents for assessment of some chest pressure that began yesterday associate with shortness of breath that is significantly exacerbated by exertion.  Patient also states that she thinks her legs are more swollen than usual.  She denies any headache, earache, sore throat, cough, fevers, nausea, vomiting, diarrhea, dysuria, rash, abdominal pain, back pain, extremity pain recent falls or injuries.  States she has been compliant with all her medications.  No clear alleviating or aggravating factors other than exertion.  Patient denies tobacco abuse or EtOH use.         Past Medical History:  Diagnosis Date  . Allergy   . Anemia   . Anxiety   . Arthritis   . CHF (congestive heart failure) ([place])   . Depression   . GERD (gastroesophageal reflux disease)   . Hypertension   . MVP (mitral valve prolapse)     Patient Active Problem List   Diagnosis Date Noted  . Chest pressure 02/20/2021  . Left sided abdominal pain 02/01/2021  . Chronic diastolic CHF (congestive heart failure) ([place]) 08/09/2020  . COPD (chronic obstructive pulmonary disease) ([place]) 08/09/2020  . Chronic respiratory failure with hypoxia ([place]) 08/09/2020  . Polyp of sigmoid colon   . Polyp of cecum   . Gastritis and gastroduodenitis   . Pylorus ulcer   . Pyloric stenosis in adult   . Chronic cholecystitis 05/19/2020  . Oxygen dependent 03/30/2020  . Nausea and vomiting in adult 03/22/2020  . Peptic ulcer disease  03/22/2020  . Iron deficiency anemia 03/22/2020  . Acute pain of right knee 12/29/2019  . Acute left-sided low back pain without sciatica 12/29/2019  . Fall 04/08/2019  . Chronic abdominal pain 02/19/2019  . Asymptomatic bacteriuria 02/19/2019  . CKD (chronic kidney disease) stage 3, GFR 30-59 ml/min (HCC) 01/13/2019  . CHF (congestive heart failure) ([place]) 01/13/2019  . Dizziness 01/13/2019  . Hoarseness 11/12/2018  . Dysphagia 11/12/2018  . Sleepiness 11/12/2018  . CVA (cerebral vascular accident) ([place]) 08/22/2018  . Left-sided weakness 10/04/2017  . HLD (hyperlipidemia) 10/04/2017  . GERD (gastroesophageal reflux disease) 10/04/2017  . Anxiety 08/26/2017  . Essential hypertension   . Depression   . Diarrhea 02/22/2015  . Hypokalemia 02/22/2015  . Chronic lower back pain 04/29/2013  . Paresthesia 04/29/2013    Past Surgical History:  Procedure Laterality Date  . ABDOMINAL HYSTERECTOMY    . APPENDECTOMY    . BALLOON DILATION N/A 04/21/2020   Procedure: BALLOON DILATION;  Surgeon: Milus Banister, MD;  Location: [facility];  Service: Endoscopy;  Laterality: N/A;  pyloric/ GI  . BALLOON DILATION N/A 07/04/2020   Procedure: BALLOON DILATION;  Surgeon: Mauri Pole, MD;  Location: [facility];  Service: Endoscopy;  Laterality: N/A;  . BIOPSY  04/21/2020   Procedure: BIOPSY;  Surgeon: Milus Banister, MD;  Location: [facility];  Service: Endoscopy;;  . BIOPSY  07/04/2020   Procedure: BIOPSY;  Surgeon: Mauri Pole, MD;  Location: [facility];  Service: Endoscopy;;  EGD and COLON  . BREAST SURGERY Right 1988   tumor removal  . CHOLECYSTECTOMY N/A 05/18/2020   Procedure: LAPAROSCOPIC CHOLECYSTECTOMY;  Surgeon: Johnathan Hausen, MD;  Location: [facility];  Service: General;  Laterality: N/A;  . COLONOSCOPY WITH PROPOFOL N/A 07/04/2020   Procedure: COLONOSCOPY WITH PROPOFOL;  Surgeon: Mauri Pole, MD;  Location: [facility];  Service: Endoscopy;  Laterality: N/A;   . ESOPHAGEAL DILATION  08/11/2020   Procedure: PYLORIC DILATION;  Surgeon: Doran Stabler, MD;  Location: [facility];  Service: Gastroenterology;;  . ESOPHAGOGASTRODUODENOSCOPY (EGD) WITH PROPOFOL N/A 04/21/2020   Procedure: ESOPHAGOGASTRODUODENOSCOPY (EGD) WITH PROPOFOL;  Surgeon: Milus Banister, MD;  Location: [facility];  Service: Endoscopy;  Laterality: N/A;  . ESOPHAGOGASTRODUODENOSCOPY (EGD) WITH PROPOFOL N/A 07/04/2020   Procedure: ESOPHAGOGASTRODUODENOSCOPY (EGD) WITH PROPOFOL;  Surgeon: Mauri Pole, MD;  Location: [facility];  Service: Endoscopy;  Laterality: N/A;  . ESOPHAGOGASTRODUODENOSCOPY (EGD) WITH PROPOFOL N/A 08/11/2020   Procedure: ESOPHAGOGASTRODUODENOSCOPY (EGD) WITH PROPOFOL;  Surgeon: Doran Stabler, MD;  Location: [facility];  Service: Gastroenterology;  Laterality: N/A;  . KNEE SURGERY  2005   2 times left  . POLYPECTOMY  07/04/2020   Procedure: POLYPECTOMY;  Surgeon: Mauri Pole, MD;  Location: [facility];  Service: Endoscopy;;  . TUBAL LIGATION      Prior to Admission medications   Medication Sig Start Date End Date Taking? Authorizing Provider  albuterol (VENTOLIN HFA) 108 (90 Base) MCG/ACT inhaler Inhale 1-2 puffs into the lungs every 6 (six) hours as needed for wheezing or shortness of breath. 10/31/20   Tasia Catchings, Amy V, PA-C  ARIPiprazole (ABILIFY) 2 MG tablet Take 2 mg by mouth daily. 07/14/20   [provider]  aspirin-acetaminophen-caffeine (EXCEDRIN MIGRAINE) [phone] MG tablet Take 1 tablet by mouth every 6 (six) hours as needed for headache or migraine.    [provider]  atorvastatin (LIPITOR) 40 MG tablet Take 40 mg by mouth daily.     [provider]  carvedilol (COREG) 3.125 MG tablet Take 3.125 mg by mouth in the morning and at bedtime.    [provider]  dicyclomine (BENTYL) 10 MG capsule Take 1 capsule (10 mg total) by mouth 4 (four) times daily -  before meals and at bedtime. 02/13/21    [name], MD  Ferrous Sulfate (IRON) 325 (65 Fe) MG TABS Take 1 tablet (325 mg total) by mouth daily. Patient not taking: No sig reported 04/09/19   [name], MD  FLUoxetine (PROZAC) 40 MG capsule Take 40 mg by mouth daily.    [provider]  furosemide (LASIX) 40 MG tablet Take 1 tablet (40 mg total) by mouth daily as needed for fluid or edema. Patient taking differently: Take 40 mg by mouth in the morning. 08/22/20   Mercy Riding, MD  gabapentin (NEURONTIN) 400 MG capsule Take 400 mg by mouth 3 (three) times daily as needed (for nerve pain). 10/20/18   [provider]  LINZESS 145 MCG CAPS capsule TAKE 1 CAPSULE (145 MCG TOTAL) BY MOUTH DAILY BEFORE BREAKFAST. Patient taking differently: Take 145 mcg by mouth daily as needed (for constipation, or as otherwise instructed). 01/03/21   Mauri Pole, MD  mirtazapine (REMERON) 15 MG tablet Take 15 mg by mouth at bedtime. 10/20/20   [provider]  omeprazole (PRILOSEC) 40 MG capsule Take 1 capsule (40 mg total) by mouth daily. Patient taking differently: Take 40 mg by mouth daily before breakfast. 08/29/20  [name], MD  ondansetron (ZOFRAN-ODT) 4 MG disintegrating tablet Take 1 tablet (4 mg total) by mouth every 8 (eight) hours as needed for nausea or vomiting. 02/13/21   [name], MD  oxycodone (OXY-IR) 5 MG capsule Take 1 capsule (5 mg total) by mouth every 8 (eight) hours as needed for pain. 02/13/21   [name], MD  OXYGEN Inhale 2 L/min into the lungs See admin instructions. 2 L/min at bedtime and as needed for shortness of breath throughout the day    [provider]  Potassium Chloride ER 20 MEQ TBCR Take 20 mEq by mouth daily. 02/05/21   Lorin Glass, PA-C  psyllium (METAMUCIL SMOOTH TEXTURE) 58.6 % powder Take 1 packet by mouth 3 (three) times daily. 08/18/20   Mercy Riding, MD  sucralfate (CARAFATE) 1 g tablet TAKE 1 TABLET (1 G TOTAL) BY MOUTH 4 (FOUR) TIMES DAILY  - WITH MEALS AND AT BEDTIME. 12/15/20   Nandigam, Venia Minks, MD    Allergies Meperidine, Meperidine hcl, Strawberry extract, Carafate [sucralfate], Strawberry (diagnostic), Wasp venom, and Wasp venom protein  Family History  Problem Relation Age of Onset  . Breast cancer Mother 80  . Ovarian cancer Mother 84  . Brain cancer Mother   . Hypertension Father   . Hyperlipidemia Father   . Heart disease Father   . Colon cancer Father   . Breast cancer Maternal Grandmother 37  . Diabetes Paternal Grandmother   . Breast cancer Paternal Grandmother 49  . Other Son        drug over dose  . Colon cancer Maternal Grandfather   . Liver disease Maternal Grandfather   . Breast cancer Maternal Aunt 60  . Breast cancer Maternal Aunt 60  . Colon cancer Maternal Uncle   . Stomach cancer Neg Hx   . Pancreatic cancer Neg Hx   . Esophageal cancer Neg Hx     Social History Social History   Tobacco Use  . Smoking status: Former Smoker    Packs/day: 1.00    Years: 15.00    Pack years: 15.00    Types: Cigarettes    Quit date: 09/12/2017    Years since quitting: 3.4  . Smokeless tobacco: Never Used  Vaping Use  . Vaping Use: Never used  Substance Use Topics  . Alcohol use: Not Currently  . Drug use: No    Review of Systems  Review of Systems  Constitutional: Positive for malaise/fatigue. Negative for chills and fever.  HENT: Negative for sore throat.   Eyes: Negative for pain.  Respiratory: Positive for shortness of breath. Negative for cough and stridor.   Cardiovascular: Positive for chest pain, orthopnea and leg swelling.  Gastrointestinal: Negative for vomiting.  Genitourinary: Negative for dysuria.  Musculoskeletal: Negative for myalgias.  Skin: Negative for rash.  Neurological: Negative for seizures, loss of consciousness and headaches.  Psychiatric/Behavioral: Negative for suicidal ideas.  All other systems reviewed and are negative.      ____________________________________________   PHYSICAL EXAM:  VITAL SIGNS: ED Triage Vitals  Enc Vitals Group     BP 02/20/21 1632 (!) 187/87     Pulse Rate 02/20/21 1632 66     Resp 02/20/21 1632 (!) 25     Temp 02/20/21 1632 98.4 F (36.9 C)     Temp src --      SpO2 02/20/21 1632 95 %     Weight 02/20/21 1630 246 lb (111.6 kg)  Height 02/20/21 1630 6\' 1"  (1.854 m)     Head Circumference --      Peak Flow --      Pain Score 02/20/21 1630 7     Pain Loc --      Pain Edu? --      Excl. in [place]? --    Vitals:   02/20/21 1830 02/20/21 1924  BP: (!) 182/96 (!) 184/97  Pulse: 64 66  Resp: 15 18  Temp:    SpO2: 97% 97%   Physical Exam Vitals and nursing note reviewed.  Constitutional:      General: She is not in acute distress.    Appearance: She is well-developed and well-nourished.  HENT:     Head: Normocephalic and atraumatic.     Right Ear: External ear normal.     Left Ear: External ear normal.     Nose: Nose normal.  Eyes:     Conjunctiva/sclera: Conjunctivae normal.  Cardiovascular:     Rate and Rhythm: Normal rate and regular rhythm.     Heart sounds: No murmur heard.   Pulmonary:     Effort: Pulmonary effort is normal. No respiratory distress.     Breath sounds: Examination of the right-lower field reveals rales. Examination of the left-lower field reveals rales. Wheezing ( R base) and rales present.  Abdominal:     Palpations: Abdomen is soft.     Tenderness: There is no abdominal tenderness.  Musculoskeletal:     Cervical back: Neck supple.     Right lower leg: Edema present.     Left lower leg: Edema present.  Skin:    General: Skin is warm and dry.  Neurological:     Mental Status: She is alert.  Psychiatric:        Mood and Affect: Mood and affect and mood normal.      ____________________________________________   LABS (all labs ordered are listed, but only abnormal results are displayed)  Labs Reviewed  BASIC METABOLIC PANEL  - Abnormal; Notable for the following components:      Result Value   Potassium 3.3 (*)    Glucose, Bld 103 (*)    Creatinine, Ser 1.10 (*)    Calcium 8.4 (*)    GFR, Estimated 55 (*)    All other components within normal limits  CBC - Abnormal; Notable for the following components:   Hemoglobin 10.0 (*)    HCT 32.3 (*)    MCH 25.5 (*)    All other components within normal limits  BRAIN NATRIURETIC PEPTIDE - Abnormal; Notable for the following components:   B Natriuretic Peptide 103.0 (*)    All other components within normal limits  D-DIMER, QUANTITATIVE - Abnormal; Notable for the following components:   D-Dimer, Quant 0.59 (*)    All other components within normal limits  RESP PANEL BY RT-PCR (FLU A&B, COVID) [id number]  PROCALCITONIN  MAGNESIUM  TROPONIN I (HIGH SENSITIVITY)  TROPONIN I (HIGH SENSITIVITY)   ____________________________________________  EKG  Sinus rhythm with ventricular to 70, normal axis, unremarkable intervals no clear evidence of acute ischemia or significant underlying arrhythmia. ____________________________________________  RADIOLOGY  ED MD interpretation: Bilateral haziness possibly reflecting pulmonary edema versus early infiltrate.  No large effusion, focal consolidation, pneumothorax or other acute intrathoracic process.  Official radiology report(s): DG Chest 2 View  Result Date: 02/20/2021 CLINICAL DATA:  Centralized chest pain with shortness of breath. EXAM: CHEST - 2 VIEW COMPARISON:  February 05, 2021 FINDINGS: Mild diffusely increased  interstitial lung markings are seen. Mild areas of atelectasis and/or infiltrate are also noted within the bilateral lung bases. This is stable in appearance when compared to the prior study. There is no evidence of a pleural effusion or pneumothorax. The cardiac silhouette is borderline in size. The visualized skeletal structures are unremarkable. IMPRESSION: Mild bibasilar atelectasis and/or infiltrate.  Electronically Signed   By: Virgina Norfolk M.D.   On: 02/20/2021 17:04   CT Angio Chest PE W and/or Wo Contrast  Result Date: 02/20/2021 CLINICAL DATA:  PE suspected, high prob Chest pain since yesterday.  Shortness of breath with exertion. EXAM: CT ANGIOGRAPHY CHEST WITH CONTRAST TECHNIQUE: Multidetector CT imaging of the chest was performed using the standard protocol during bolus administration of intravenous contrast. Multiplanar CT image reconstructions and MIPs were obtained to evaluate the vascular anatomy. CONTRAST:  2mL OMNIPAQUE IOHEXOL 350 MG/ML SOLN COMPARISON:  Radiograph earlier today. Most recent chest CTA 08/22/2018 FINDINGS: Cardiovascular: There are no filling defects within the pulmonary arteries to suggest pulmonary embolus. Atherosclerosis of the thoracic aorta. Ascending aortic aneurysm maximal dimension 4.5 cm, previously 4.3 cm. No dissection or acute aortic abnormality. Conventional branching pattern from the aortic arch. Upper normal heart size. Coronary artery calcifications. No pericardial effusion. Mediastinum/Nodes: No enlarged mediastinal or hilar lymph nodes. No axillary adenopathy. No esophageal wall thickening. No thyroid nodule. Lungs/Pleura: Biapical pleuroparenchymal scarring. Mild central bronchial thickening. Scattered subsegmental atelectasis. No pneumonia or confluent airspace disease. Punctate calcified granuloma at the right lung apex and left lower lobe. No mass or suspicious nodule. No pleural fluid or findings of pulmonary edema. Upper Abdomen: No acute or unexpected finding. Musculoskeletal: Remote sternal body fracture. Remote left anterior rib fractures. There are no acute or suspicious osseous abnormalities. Stable asymmetric breast tissue. No focal chest wall abnormality. Review of the MIP images confirms the above findings. IMPRESSION: 1. No pulmonary embolus. 2. Mild central bronchial thickening with scattered subsegmental atelectasis. 3. Ascending aortic  aneurysm maximal dimension 4.5 cm, previously 4.3 cm. No dissection or acute aortic abnormality. Ascending thoracic aortic aneurysm. Recommend semi-annual imaging followup by CTA or MRA and referral to cardiothoracic surgery if not already obtained. This recommendation follows 2010 ACCF/AHA/AATS/ACR/ASA/SCA/SCAI/SIR/STS/SVM Guidelines for the Diagnosis and Management of Patients With Thoracic Aortic Disease. Circulation. 2010; 121: Z610-R604. Aortic aneurysm NOS (ICD10-I71.9) 4. Coronary artery calcifications. Aortic Atherosclerosis (ICD10-I70.0). Electronically Signed   By: Keith Rake M.D.   On: 02/20/2021 19:37    ____________________________________________   PROCEDURES  Procedure(s) performed (including Critical Care):  .1-3 Lead EKG Interpretation Performed by: Lucrezia Starch, MD Authorized by: Lucrezia Starch, MD     Interpretation: normal     ECG rate assessment: normal     Rhythm: sinus rhythm     Ectopy: none     Conduction: normal       ____________________________________________   INITIAL IMPRESSION / ASSESSMENT AND PLAN / ED COURSE      Patient presents with above to history exam for assessment of chest pressure associated with shortness of breath severely aggravated by minimal exertion and worsening lower extremity edema last couple days.  She also endorses orthopnea.  She has been compliant with her medications including Lasix.  On arrival she is hypertensive with BP of 197/87 with otherwise stable vital signs on room air.  She does have some rales at her lung bases and appears edematous on exam.  Primary differential includes acute heart failure exacerbation versus worsening of known mitral valve prolapse, COPD exacerbation ACS, arrhythmia, symptomatic  anemia, metabolic derangements, pneumonia and possible PE.   CTA obtained due to elevated dimer shows no evidence of PE, pneumonia pneumothorax or other acute intrathoracic abnormality.  Patient has a known  stable aortic aneurysm which is seen and slightly larger than prior but no significant change and appropriate for further outpatient surveillance.  There is also evidence of extensive coronary disease and aortic atherosclerosis.  Chest x-ray unremarkable.  ECG is reassuring and given nonelevated troponin x2 of low suspicion for ACS.  BNP is 1 3 compared to 82 2 weeks ago.  Patient has no significant edema on chest imaging given orthopnea and edema on exam as well as some rales at the bases she was given Lasix for mild CHF exacerbation.  Given wheezing is also treated with duo nebs and steroids.  BMP remarkable for K of 3.3 which was repleted with no other significant derangements.  CBC shows no leukocytosis or acute anemia.  Hemoglobin is 10 compared to 10.12 weeks ago.  Covid is negative for pro-Cal is undetectable and given absence of fever elevated blood cell count or findings on CT suggestive infection I have a low suspicion for acute infectious process at this time.  Magnesium is WNL.  On reassessment patient is approximately halfway through her DuoNeb treatment and states she feels better.  She has also peed approximately 800 cc of urine.  Care patient signed over to Dr. Leory Plowman at approximately 8 PM.  Plan is to reassess patient after she has completed her DuoNeb therapy and if she is feeling better and able to ambulate with improvement in her dyspnea she will likely be safe for discharge with plan for outpatient follow-up.  If she is still dyspneic with exertion she will likely require admission for continued diuresis and DuoNeb therapy.  ____________________________________________   FINAL CLINICAL IMPRESSION(S) / ED DIAGNOSES  Final diagnoses:  Acute on chronic congestive heart failure, unspecified heart failure type (HCC)  COPD exacerbation (HCC)  Hypokalemia  Thoracic aortic aneurysm without rupture (HCC)  Aortic atherosclerosis (HCC)  Coronary artery disease involving native heart  without angina pectoris, unspecified vessel or lesion type    Medications  nitroGLYCERIN (NITROSTAT) SL tablet 0.4 mg (has no administration in time range)  potassium chloride SA (KLOR-CON) CR tablet 40 mEq (40 mEq Oral Given 02/20/21 1833)  carvedilol (COREG) tablet 3.125 mg (3.125 mg Oral Given 02/20/21 1833)  furosemide (LASIX) injection 60 mg (60 mg Intravenous Given 02/20/21 1833)  ipratropium-albuterol (DUONEB) 0.5-2.5 (3) MG/3ML nebulizer solution 9 mL (9 mLs Nebulization Given 02/20/21 1924)  methylPREDNISolone sodium succinate (SOLU-MEDROL) 125 mg/2 mL injection 125 mg (125 mg Intravenous Given 02/20/21 1922)  iohexol (OMNIPAQUE) 350 MG/ML injection 75 mL (75 mLs Intravenous Contrast Given 02/20/21 1912)     ED Discharge Orders    None       Note:  This document was prepared using Dragon voice recognition software and may include unintentional dictation errors.   Lucrezia Starch, MD 02/20/21 1950

## 2021-02-20 NOTE — Progress Notes (Signed)
   Subjective:     Morgan [name] is a 70 y.o. female presenting for Shortness of Breath and Chest Pain (States that it is tight and feels like something is sitting on it. )     HPI  #Chest pressure - getting worse - started yesterday - shortness of breath - worse with laying down - oxygen levels are worse - 2 L at home - even with 2 liters cannot breath - worse with laying flat - taking her medication - swelling in her legs - not sure about weight  - chest pain is constant - also with numbness in the left hand on the 5th digit  Review of Systems   Social History   Tobacco Use  Smoking Status Former Smoker  . Packs/day: 1.00  . Years: 15.00  . Pack years: 15.00  . Types: Cigarettes  . Quit date: 09/12/2017  . Years since quitting: 3.4  Smokeless Tobacco Never Used        Objective:    BP Readings from Last 3 Encounters:  02/20/21 (!) 164/92  02/13/21 132/78  02/05/21 135/77   Wt Readings from Last 3 Encounters:  02/20/21 249 lb 8 oz (113.2 kg)  02/13/21 252 lb (114.3 kg)  02/05/21 254 lb (115.2 kg)    BP (!) 164/92   Pulse 89   Temp 97.7 F (36.5 C) (Temporal)   Wt 249 lb 8 oz (113.2 kg)   SpO2 98%   BMI 32.92 kg/m    Physical Exam Constitutional:      Appearance: She is well-developed. She is obese.  HENT:     Head: Normocephalic and atraumatic.  Neck:     Comments: Difficulty assessing JVD due to accessory muscle use Cardiovascular:     Rate and Rhythm: Normal rate and regular rhythm.     Heart sounds: No murmur heard.   Pulmonary:     Effort: Tachypnea and accessory muscle usage (when laying at 30 degrees) present.     Breath sounds: Wheezing and rales present.  Skin:    General: Skin is warm.  Neurological:     Mental Status: She is alert.  Psychiatric:        Mood and Affect: Mood normal.        Behavior: Behavior normal.           Assessment & Plan:   Problem List Items Addressed This Visit      Cardiovascular and  Mediastinum   Chronic diastolic CHF (congestive heart failure) (HCC)     Respiratory   COPD (chronic obstructive pulmonary disease) (HCC)     Other   Chest pressure - Primary    Pt with 1 day of chest pressure and orthopnea. Attempted to get EKG in the office but pt unable to lay flat. Discussed concern for possible cardiac cause and recommend ER for evaluation to rule out heart attack. Given orthopnea suspect CHF exacerbation though wheezes on exam but w/o complaining of worsening COPD symptoms. Pt roommate came to get her to take her to [facility] ER. Attempted to signout but phone call was routed in correctly.           No follow-ups on file.  [name], MD  This visit occurred during the SARS-CoV-2 public health emergency.  Safety protocols were in place, including screening questions prior to the visit, additional usage of staff PPE, and extensive cleaning of exam room while observing appropriate contact time as indicated for disinfecting solutions.

## 2021-02-20 NOTE — Assessment & Plan Note (Signed)
Pt with 1 day of chest pressure and orthopnea. Attempted to get EKG in the office but pt unable to lay flat. Discussed concern for possible cardiac cause and recommend ER for evaluation to rule out heart attack. Given orthopnea suspect CHF exacerbation though wheezes on exam but w/o complaining of worsening COPD symptoms. Pt roommate came to get her to take her to [facility] ER. Attempted to signout but phone call was routed in correctly.

## 2021-02-20 NOTE — ED Triage Notes (Addendum)
Pt via POV from home. Pt c/o centralized chest pain since yesterday. Pt also c/o SOB with exertion since last night and unable to lay flat. Pt also noticed more swelling in her legs and feet since yesterday. Pt does have a hx of CHF and takes lasix. Pt did not miss any doses of her lasix. Pt is A&Ox4 and NAD.

## 2021-02-21 ENCOUNTER — Telehealth: Payer: Self-pay

## 2021-02-21 NOTE — Telephone Encounter (Signed)
[employer] sent approval on PA for Dicyclomine.  Good from 12/17/20-12/16/21

## 2021-02-22 ENCOUNTER — Telehealth: Payer: Self-pay

## 2021-02-22 DIAGNOSIS — J449 Chronic obstructive pulmonary disease, unspecified: Secondary | ICD-10-CM | POA: Diagnosis not present

## 2021-02-22 DIAGNOSIS — I509 Heart failure, unspecified: Secondary | ICD-10-CM | POA: Diagnosis not present

## 2021-02-22 NOTE — Telephone Encounter (Signed)
Left message for patient to call back  Spoke with Dr Einar Pheasant and let her know that there are no local Physicatrist that are able to see patient for chronic abdominal pain. Could try [facility]. Dr Einar Pheasant advised me to try sports medicine evaluation. Per Epic patient was set up to see Dr Tamala Julian with [facility] at [facility] in December but cancelled her appointment. Patient needs to reschedule and their phone number is [phone].

## 2021-02-23 DIAGNOSIS — R0789 Other chest pain: Secondary | ICD-10-CM | POA: Diagnosis not present

## 2021-02-23 DIAGNOSIS — I10 Essential (primary) hypertension: Secondary | ICD-10-CM | POA: Diagnosis not present

## 2021-02-23 DIAGNOSIS — I7789 Other specified disorders of arteries and arterioles: Secondary | ICD-10-CM | POA: Diagnosis not present

## 2021-02-23 DIAGNOSIS — E782 Mixed hyperlipidemia: Secondary | ICD-10-CM | POA: Diagnosis not present

## 2021-02-23 DIAGNOSIS — I5021 Acute systolic (congestive) heart failure: Secondary | ICD-10-CM | POA: Diagnosis not present

## 2021-02-27 NOTE — Telephone Encounter (Signed)
Left detailed message for the patient on her voicemail with the information below.

## 2021-03-03 ENCOUNTER — Ambulatory Visit (INDEPENDENT_AMBULATORY_CARE_PROVIDER_SITE_OTHER): Payer: Medicare HMO | Admitting: Physician Assistant

## 2021-03-03 ENCOUNTER — Encounter: Payer: Self-pay | Admitting: Physician Assistant

## 2021-03-03 VITALS — BP 100/52 | HR 68 | Ht 73.0 in | Wt 247.8 lb

## 2021-03-03 DIAGNOSIS — R110 Nausea: Secondary | ICD-10-CM

## 2021-03-03 DIAGNOSIS — R1012 Left upper quadrant pain: Secondary | ICD-10-CM

## 2021-03-03 MED ORDER — ONDANSETRON 4 MG PO TBDP: ORAL_TABLET | ORAL | 3 refills | Status: DC

## 2021-03-03 NOTE — Patient Instructions (Signed)
We have sent the following medications to your pharmacy for you to pick up at your convenience: Zofran 4 mg taking 1-2 tablets by mouth every 6 hours as needed.   Dr. Alroy Dust Smith's office will contact you with an appointment.   Please follow up with Dr. Silverio Decamp in 3-4 months.

## 2021-03-03 NOTE — Progress Notes (Signed)
Chief Complaint: Follow-up nausea and abdominal pain  HPI:    Morgan [name] is a 70 year old Caucasian female with a past medical history as listed below including CHF and GERD, known to Dr. Silverio Decamp, who returns to clinic today for follow-up of her nausea and abdominal pain.    09/29/2020 patient seen in clinic by Dr. Silverio Decamp for abdominal pain, GERD and dysphagia. At that time described continued left-sided abdominal pain, apparently had been put empirically treated with Cipro Flagyl for possible mild diverticulitis. Described irregular bowel habits with alternating constipation diarrhea, abdominal bloating nausea and regurgitation. CT abdomen pelvis 09/02/2020 showed diverticulosis with no acute diverticulitis. At that time discussed that left-sided abdominal pain had unclear etiology with no acute pathology noted on recent CT. An upper GI series with small bowel follow-through was ordered. She was continued on PPI. Unclear etiology of nausea and was given Zofran as needed for severe symptoms. Also discussed considering esophageal manometry of continued to have persistent symptoms with dysphagia if she had no abnormality on barium study. Also given a trial of Movantik 25 mg daily for her opiate induced constipation. Also recommended Benefiber 1 tablespoon 3 times daily with meals.     10/26/2020 CT abdomen pelvis with contrast showed colonic diverticulosis without evidence of acute diverticulitis.    10/31/2020 patient diagnosed with Covid.  She declined monoclonal antibody infusion.    11/22/2020 patient seen in clinic and continued to complain of nausea and abdominal pain.  Most concerning to her was a left upper quadrant pain which she described "flared up" to a 10 out of 10 sharp in nature.  The only thing that helped it was oxycodone typically prescribed by her PCP.  At that time was using Benefiber 3 times a day and Movantik continue with nausea using 2-3 Zofran a day.  At that visit reviewed recent  testing she had had a recent EGD, colonoscopy and 3 CTs of the abdomen and pelvis which had all been mostly normal with no cause seen for continued pain, at time of that visit she is tender on the thoracic spine and around to the left side of her rib cage, there was concern that this is musculoskeletal and she was referred to Charlann Boxer.  Encouraged the patient to call her PCP to refill her oxycodone.  Continue Movantik and fiber.  Discussed that she had just had Covid so is hard to discern if some of the symptoms were left over from this.     11/29/2020 upper GI study with small bowel follow-through was negative.    02/05/2021 CT the abdomen pelvis without contrast showed no acute abdominal pelvic abnormality, rectosigmoid diverticulosis without acute inflammation and aortic atherosclerosis.    02/20/2021 chest x-ray for centralized chest pain or shortness of breath with mild bibasilar atelectasis and/or infiltrate.    02/20/2021 patient seen at [facility] emergency department for chest pain and shortness of breath.  Labs showed a potassium of 3.3, creatinine 1.1, CBC with a hemoglobin of 10.  CT angio of the chest showed no pulmonary embolus, mild central bronchial thickening with scattered subsegmental atelectasis, ascending aortic aneurysm, no dissection or acute aortic abnormality.  Coronary artery calcifications.  She was treated with duo nebs and steroids.    02/22/2021 phone note indicates that her primary care physician is trying to get her to see a psychiatrist in regards to chronic abdominal pain.  Patient never saw Dr. Tamala Julian as referred by her clinic.    This morning, patient tells me that  she continues with some left upper quadrant pain as well as right upper quadrant pain which seems to radiate around from her back which she describes as "achy", this is better than it was previously when it was "sharp" and a 10 out of 10, so there has been some improvement though she is not really done anything to help it.   Patient tells me she never went to see Charlann Boxer because she got Covid "again".    Patient's largest complaint today is of continued nausea, currently she is using her Zofran 4 mg scheduled every 8 hours, this helps mostly, but she will still have some breakthrough nausea "like I when I was riding in the car the other day".  She then took another Zofran which did stop her symptoms.  Patient is debating about stopping some of her psychiatric medicines to see if this helps.    Denies fever, chills, weight loss or symptoms that awaken her from sleep.     PREVIOUS ENDOSCOPIC EVALUATIONS / GI STUDIES :   8/224/21 CT scan  abd/ pelvis with contrast -Few mildly dilated loops of small bowel in the mid abdomen with air-fluid levels suggesting low-grade obstruction.   07/04/20 EGD LA Grade B reflux esophagitis with no bleeding. - Small hiatal hernia. - Gastritis with hemorrhage. Biopsied. - Non-bleeding gastric ulcer with a clean ulcer base (Forrest Class III). Biopsied. - Gastric stenosis was found at the pylorus. Dilated. - Rule out malignancy, duodenal mass. Biopsied. - Normal second portion of the duodenum.   07/04/20 Colonoscopy Three 1 to 2 mm polyps in the sigmoid colon and in the cecum, removed with a cold biopsy forceps. Resected and retrieved. - Two 4 to 6 mm polyps in the sigmoid colon, removed with a cold snare. Resected and 1 of the 2 polyps retrieved. - Moderate diverticulosis in the sigmoid colon, in the descending colon and in the ascending colon. There was evidence of an impacted diverticulum. - Non-bleeding internal hemorrhoids.   FINAL MICROSCOPIC DIAGNOSIS:   A. PYLORIC CHANNEL, BIOPSY:  - Gastric antral mucosa with nonspecific reactive gastropathy  - Focal intestinal metaplasia, negative for dysplasia or carcinoma   B. GASTRIC, RANDOM, BIOPSY:  - Gastric antral and oxyntic mucosa with nonspecific reactive  gastropathy  - Warthin Starry stain is negative for Helicobacter  pylori   C. CECAL POLYP, SIGMOID COLON POLYPS, POLYPECTOMY:  - Tubular adenoma without high-grade dysplasia or malignancy  - Hyperplastic polyp(s)    04/21/20 EGD Normal GE junction. - Chronic, benign appearing pyloric stenosis. Biopsied and dilated to 74mm with CRE balloon. - Mild, non-specific gastritis was biopsied to check for H. pylori.  Past Medical History:  Diagnosis Date  . Allergy   . Anemia   . Anxiety   . Arthritis   . CHF (congestive heart failure) ([place])   . Depression   . GERD (gastroesophageal reflux disease)   . Hypertension   . MVP (mitral valve prolapse)     Past Surgical History:  Procedure Laterality Date  . ABDOMINAL HYSTERECTOMY    . APPENDECTOMY    . BALLOON DILATION N/A 04/21/2020   Procedure: BALLOON DILATION;  Surgeon: Milus Banister, MD;  Location: [facility];  Service: Endoscopy;  Laterality: N/A;  pyloric/ GI  . BALLOON DILATION N/A 07/04/2020   Procedure: BALLOON DILATION;  Surgeon: Mauri Pole, MD;  Location: [facility];  Service: Endoscopy;  Laterality: N/A;  . BIOPSY  04/21/2020   Procedure: BIOPSY;  Surgeon: Milus Banister, MD;  Location: [facility];  Service: Endoscopy;;  . BIOPSY  07/04/2020   Procedure: BIOPSY;  Surgeon: Mauri Pole, MD;  Location: [facility];  Service: Endoscopy;;  EGD and COLON  . BREAST SURGERY Right 1988   tumor removal  . CHOLECYSTECTOMY N/A 05/18/2020   Procedure: LAPAROSCOPIC CHOLECYSTECTOMY;  Surgeon: Johnathan Hausen, MD;  Location: [facility];  Service: General;  Laterality: N/A;  . COLONOSCOPY WITH PROPOFOL N/A 07/04/2020   Procedure: COLONOSCOPY WITH PROPOFOL;  Surgeon: Mauri Pole, MD;  Location: [facility];  Service: Endoscopy;  Laterality: N/A;  . ESOPHAGEAL DILATION  08/11/2020   Procedure: PYLORIC DILATION;  Surgeon: Doran Stabler, MD;  Location: [facility];  Service: Gastroenterology;;  . ESOPHAGOGASTRODUODENOSCOPY (EGD) WITH PROPOFOL N/A 04/21/2020   Procedure:  ESOPHAGOGASTRODUODENOSCOPY (EGD) WITH PROPOFOL;  Surgeon: Milus Banister, MD;  Location: [facility];  Service: Endoscopy;  Laterality: N/A;  . ESOPHAGOGASTRODUODENOSCOPY (EGD) WITH PROPOFOL N/A 07/04/2020   Procedure: ESOPHAGOGASTRODUODENOSCOPY (EGD) WITH PROPOFOL;  Surgeon: Mauri Pole, MD;  Location: [facility];  Service: Endoscopy;  Laterality: N/A;  . ESOPHAGOGASTRODUODENOSCOPY (EGD) WITH PROPOFOL N/A 08/11/2020   Procedure: ESOPHAGOGASTRODUODENOSCOPY (EGD) WITH PROPOFOL;  Surgeon: Doran Stabler, MD;  Location: [facility];  Service: Gastroenterology;  Laterality: N/A;  . KNEE SURGERY  2005   2 times left  . POLYPECTOMY  07/04/2020   Procedure: POLYPECTOMY;  Surgeon: Mauri Pole, MD;  Location: [facility];  Service: Endoscopy;;  . TUBAL LIGATION      Current Outpatient Medications  Medication Sig Dispense Refill  . albuterol (VENTOLIN HFA) 108 (90 Base) MCG/ACT inhaler Inhale 1-2 puffs into the lungs every 6 (six) hours as needed for wheezing or shortness of breath. 18 g 0  . ARIPiprazole (ABILIFY) 2 MG tablet Take 2 mg by mouth daily.    Marland Kitchen aspirin-acetaminophen-caffeine (EXCEDRIN MIGRAINE) 250-250-65 MG tablet Take 1 tablet by mouth every 6 (six) hours as needed for headache or migraine.    Marland Kitchen atorvastatin (LIPITOR) 40 MG tablet Take 40 mg by mouth daily.     . carvedilol (COREG) 3.125 MG tablet Take 3.125 mg by mouth in the morning and at bedtime.    . dicyclomine (BENTYL) 10 MG capsule Take 1 capsule (10 mg total) by mouth 4 (four) times daily -  before meals and at bedtime. 120 capsule 1  . Ferrous Sulfate (IRON) 325 (65 Fe) MG TABS Take 1 tablet (325 mg total) by mouth daily. (Patient not taking: No sig reported) 90 each 0  . FLUoxetine (PROZAC) 40 MG capsule Take 40 mg by mouth daily.    . furosemide (LASIX) 40 MG tablet Take 1 tablet (40 mg total) by mouth daily as needed for fluid or edema. (Patient taking differently: Take 40 mg by mouth in the morning.)  30 tablet   . gabapentin (NEURONTIN) 400 MG capsule Take 400 mg by mouth 3 (three) times daily as needed (for nerve pain).    Marland Kitchen LINZESS 145 MCG CAPS capsule TAKE 1 CAPSULE (145 MCG TOTAL) BY MOUTH DAILY BEFORE BREAKFAST. (Patient taking differently: Take 145 mcg by mouth daily as needed (for constipation, or as otherwise instructed).) 30 capsule 3  . methylPREDNISolone (MEDROL DOSEPAK) 4 MG TBPK tablet Follow instructions in package 1 each 0  . mirtazapine (REMERON) 15 MG tablet Take 15 mg by mouth at bedtime.    Marland Kitchen omeprazole (PRILOSEC) 40 MG capsule Take 1 capsule (40 mg total) by mouth daily. (Patient taking differently: Take 40 mg by mouth daily  before breakfast.) 90 capsule 3  . ondansetron (ZOFRAN-ODT) 4 MG disintegrating tablet Take 1 tablet (4 mg total) by mouth every 8 (eight) hours as needed for nausea or vomiting. 30 tablet 3  . oxycodone (OXY-IR) 5 MG capsule Take 1 capsule (5 mg total) by mouth every 8 (eight) hours as needed for pain. 5 capsule 0  . OXYGEN Inhale 2 L/min into the lungs See admin instructions. 2 L/min at bedtime and as needed for shortness of breath throughout the day    . Potassium Chloride ER 20 MEQ TBCR Take 20 mEq by mouth daily. 5 tablet 0  . psyllium (METAMUCIL SMOOTH TEXTURE) 58.6 % powder Take 1 packet by mouth 3 (three) times daily. 283 g 12  . sucralfate (CARAFATE) 1 g tablet TAKE 1 TABLET (1 G TOTAL) BY MOUTH 4 (FOUR) TIMES DAILY - WITH MEALS AND AT BEDTIME. 360 tablet 1   No current facility-administered medications for this visit.    Allergies as of 03/03/2021 - Review Complete 02/20/2021  Allergen Reaction Noted  . Meperidine Hives and Anaphylaxis 04/28/2013  . Meperidine hcl Anaphylaxis and Hives 02/05/2021  . Strawberry extract Hives and Swelling 08/09/2020  . Carafate [sucralfate] Nausea Only and Other (See Comments) 02/05/2021  . Strawberry (diagnostic) Hives 08/26/2017  . Wasp venom Hives 04/11/2020  . Wasp venom protein Hives 08/09/2020     Family History  Problem Relation Age of Onset  . Breast cancer Mother 3  . Ovarian cancer Mother 55  . Brain cancer Mother   . Hypertension Father   . Hyperlipidemia Father   . Heart disease Father   . Colon cancer Father   . Breast cancer Maternal Grandmother 25  . Diabetes Paternal Grandmother   . Breast cancer Paternal Grandmother 39  . Other Son        drug over dose  . Colon cancer Maternal Grandfather   . Liver disease Maternal Grandfather   . Breast cancer Maternal Aunt 60  . Breast cancer Maternal Aunt 60  . Colon cancer Maternal Uncle   . Stomach cancer Neg Hx   . Pancreatic cancer Neg Hx   . Esophageal cancer Neg Hx     Social History   Socioeconomic History  . Marital status: Single    Spouse name: Not on file  . Number of children: 2  . Years of education: Not on file  . Highest education level: Not on file  Occupational History  . Not on file  Tobacco Use  . Smoking status: Former Smoker    Packs/day: 1.00    Years: 15.00    Pack years: 15.00    Types: Cigarettes    Quit date: 09/12/2017    Years since quitting: 3.4  . Smokeless tobacco: Never Used  Vaping Use  . Vaping Use: Never used  Substance and Sexual Activity  . Alcohol use: Not Currently  . Drug use: No  . Sexual activity: Not Currently  Other Topics Concern  . 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)   Daughter - [name] - lives in the [place] system - family, aunt, [name] and [name] (who she lives with), brother   Transportation: roommates   Enjoy: [employer], playing with grandson   Exercise: PT exercises   Diet: good - chicken, some steak, tries to eat healthy, avoids fried foods   Retired from being a [employer]ar[employer] manager  Social Determinants of Health   Financial Resource Strain: Not on file  Food Insecurity: Not on file  Transportation Needs: Not on file  Physical Activity: Not on file  Stress:  Not on file  Social Connections: Not on file  Intimate Partner Violence: Not on file    Review of Systems:    Constitutional: No weight loss, fever or chills Cardiovascular: No chest pain Respiratory: No SOB Gastrointestinal: See HPI and otherwise negative   Physical Exam:  Vital signs: BP (!) 100/52   Pulse 68   Ht 6\' 1"  (1.854 m)   Wt 247 lb 12.8 oz (112.4 kg)   BMI 32.69 kg/m   Constitutional:   Pleasant Caucasian female appears to be in NAD, Well developed, Well nourished, alert and cooperative Respiratory: Respirations even and unlabored. Lungs clear to auscultation bilaterally.   No wheezes, crackles, or rhonchi.  Cardiovascular: Normal S1, S2. No MRG. Regular rate and rhythm. No peripheral edema, cyanosis or pallor.  Gastrointestinal:  Soft, nondistended, right upper quadrant and left upper quadrant. No rebound or guarding. Normal bowel sounds. No appreciable masses or hepatomegaly. Rectal:  Not performed.  Msk:  Symmetrical without gross deformities.  Tenderness to only light palpation over her left lower and right lower ribs Psychiatric: Demonstrates good judgement and reason without abnormal affect or behaviors.  RELEVANT LABS AND IMAGING: CBC    Component Value Date/Time   WBC 8.0 02/20/2021 1637   RBC 3.92 02/20/2021 1637   HGB 10.0 (L) 02/20/2021 1637   HGB 12.4 09/18/2012 2000   HCT 32.3 (L) 02/20/2021 1637   HCT 37.3 09/18/2012 2000   PLT 343 02/20/2021 1637   PLT 450 (H) 09/18/2012 2000   MCV 82.4 02/20/2021 1637   MCV 86 09/18/2012 2000   MCH 25.5 (L) 02/20/2021 1637   MCHC 31.0 02/20/2021 1637   RDW 14.9 02/20/2021 1637   RDW 14.5 09/18/2012 2000   LYMPHSABS 2.9 10/20/2020 1511   MONOABS 0.5 10/20/2020 1511   EOSABS 0.3 10/20/2020 1511   BASOSABS 0.1 10/20/2020 1511    CMP     Component Value Date/Time   NA 140 02/20/2021 1637   NA 143 12/26/2018 0000   NA 139 09/18/2012 2000   K 3.3 (L) 02/20/2021 1637   K 3.3 (L) 09/18/2012 2000   CL 102  02/20/2021 1637   CL 99 09/18/2012 2000   CO2 30 02/20/2021 1637   CO2 33 (H) 09/18/2012 2000   GLUCOSE 103 (H) 02/20/2021 1637   GLUCOSE 111 (H) 09/18/2012 2000   BUN 20 02/20/2021 1637   BUN 16 12/26/2018 0000   BUN 13 09/18/2012 2000   CREATININE 1.10 (H) 02/20/2021 1637   CREATININE 0.72 09/18/2012 2000   CALCIUM 8.4 (L) 02/20/2021 1637   CALCIUM 9.4 09/18/2012 2000   PROT 6.0 (L) 02/05/2021 0741   PROT 7.3 09/18/2012 2000   ALBUMIN 3.0 (L) 02/05/2021 0741   ALBUMIN 2.8 (L) 09/18/2012 2000   AST 16 02/05/2021 0741   AST 19 09/18/2012 2000   ALT 12 02/05/2021 0741   ALT 10 (L) 09/18/2012 2000   ALKPHOS 109 02/05/2021 0741   ALKPHOS 163 (H) 09/18/2012 2000   BILITOT 0.3 02/05/2021 0741   BILITOT 0.5 09/18/2012 2000   GFRNONAA 55 (L) 02/20/2021 1637   GFRNONAA >60 09/18/2012 2000   GFRAA >60 09/02/2020 1658   GFRAA >60 09/18/2012 2000    Assessment: 1.  Chronic left upper quadrant abdominal pain: Previously evaluated and thought to be musculoskeletal 2.  Nausea: Chronic for the patient at this point, previous evaluations with CT scans, x-rays, blood work and EGD, all unrevealing for cause; consider relation of polypharmacy  Plan: 1.  Increased patient Zofran 4 mg, 1-2 tabs every 6 hours scheduled.  Prescribed #120 with 5 refills. 2.  Discussed with the patient that we have evaluated her fully for this nausea, it may be that it is due to her polypharmacy.  If she is interested in stopping some of her psychiatric meds recommend that she make an appointment with her primary care provider so that she can do this safely. 3.  Also referred the patient to Charlann Boxer, DO for further evaluation of chronic left upper quadrant pain which I think is musculoskeletal related.  She missed her last appointment because she got Covid again. 4.  Patient to follow in clinic in 3 to 4 months with Dr. Silverio Decamp.  Ellouise Newer, PA-C [facility] 03/03/2021, 8:28 AM  Cc: [name], MD

## 2021-03-07 ENCOUNTER — Other Ambulatory Visit: Payer: Self-pay | Admitting: Family Medicine

## 2021-03-07 DIAGNOSIS — R42 Dizziness and giddiness: Secondary | ICD-10-CM | POA: Diagnosis not present

## 2021-03-07 DIAGNOSIS — G8929 Other chronic pain: Secondary | ICD-10-CM

## 2021-03-07 DIAGNOSIS — I10 Essential (primary) hypertension: Secondary | ICD-10-CM | POA: Diagnosis not present

## 2021-03-17 NOTE — Progress Notes (Signed)
[place] Phone: [phone] Subjective:   Morgan [name], am serving as a scribe for Dr. Hulan Saas. This visit occurred during the SARS-CoV-2 public health emergency.  Safety protocols were in place, including screening questions prior to the visit, additional usage of staff PPE, and extensive cleaning of exam room while observing appropriate contact time as indicated for disinfecting solutions.   I'm seeing this patient by the request  of:  Alen Blew PA  CC: Left upper quadrant pain  Morgan [name]  Morgan [name] is a 70 y.o. female coming in with complaint of LUQ pain for the past few months. Has dull ache in that area. Palpable knot in the area that is tender. Pain increases with certain foods but not movements. Was using hydrocodone for pain but is [name] longer using that medication. Uses gabapentin 400, Tid.    02/20/2021 CT angio chest IMPRESSION: 1. [name] pulmonary embolus. 2. Mild central bronchial thickening with scattered subsegmental atelectasis. 3. Ascending aortic aneurysm maximal dimension 4.5 cm, previously 4.3 cm. [name] dissection or acute aortic abnormality. Ascending thoracic aortic aneurysm. Recommend semi-annual imaging followup by CTA or MRA and referral to cardiothoracic surgery if not already obtained. This recommendation follows 2010 ACCF/AHA/AATS/ACR/ASA/SCA/SCAI/SIR/STS/SVM Guidelines for the Diagnosis and Management of Patients With Thoracic Aortic Disease. Circulation. 2010; 121: J673-A193. Aortic aneurysm NOS (ICD10-I71.9) 4. Coronary artery calcifications.    Patient has had a significant gastroenterology work-up.  Left upper GI in December 2021 with a small bowel follow-through which was negative.  Other notes have shown that patient's primary care provider is trying to get her to see psychiatrist as well for this.  Past Medical History:  Diagnosis Date  . Allergy   . Anemia    . Anxiety   . Arthritis   . CHF (congestive heart failure) ([place])   . Depression   . GERD (gastroesophageal reflux disease)   . Hypertension   . MVP (mitral valve prolapse)    Past Surgical History:  Procedure Laterality Date  . ABDOMINAL HYSTERECTOMY    . APPENDECTOMY    . BALLOON DILATION N/A 04/21/2020   Procedure: BALLOON DILATION;  Surgeon: Milus Banister, MD;  Location: [facility];  Service: Endoscopy;  Laterality: N/A;  pyloric/ GI  . BALLOON DILATION N/A 07/04/2020   Procedure: BALLOON DILATION;  Surgeon: Mauri Pole, MD;  Location: [facility];  Service: Endoscopy;  Laterality: N/A;  . BIOPSY  04/21/2020   Procedure: BIOPSY;  Surgeon: Milus Banister, MD;  Location: [facility];  Service: Endoscopy;;  . BIOPSY  07/04/2020   Procedure: BIOPSY;  Surgeon: Mauri Pole, MD;  Location: [facility];  Service: Endoscopy;;  EGD and COLON  . BREAST SURGERY Right 1988   tumor removal  . CHOLECYSTECTOMY N/A 05/18/2020   Procedure: LAPAROSCOPIC CHOLECYSTECTOMY;  Surgeon: Johnathan Hausen, MD;  Location: [facility];  Service: General;  Laterality: N/A;  . COLONOSCOPY WITH PROPOFOL N/A 07/04/2020   Procedure: COLONOSCOPY WITH PROPOFOL;  Surgeon: Mauri Pole, MD;  Location: [facility];  Service: Endoscopy;  Laterality: N/A;  . ESOPHAGEAL DILATION  08/11/2020   Procedure: PYLORIC DILATION;  Surgeon: Doran Stabler, MD;  Location: [facility];  Service: Gastroenterology;;  . ESOPHAGOGASTRODUODENOSCOPY (EGD) WITH PROPOFOL N/A 04/21/2020   Procedure: ESOPHAGOGASTRODUODENOSCOPY (EGD) WITH PROPOFOL;  Surgeon: Milus Banister, MD;  Location: [facility];  Service: Endoscopy;  Laterality: N/A;  . ESOPHAGOGASTRODUODENOSCOPY (EGD) WITH PROPOFOL N/A 07/04/2020   Procedure:  ESOPHAGOGASTRODUODENOSCOPY (EGD) WITH PROPOFOL;  Surgeon: Mauri Pole, MD;  Location: [facility];  Service: Endoscopy;  Laterality: N/A;  . ESOPHAGOGASTRODUODENOSCOPY (EGD) WITH PROPOFOL N/A  08/11/2020   Procedure: ESOPHAGOGASTRODUODENOSCOPY (EGD) WITH PROPOFOL;  Surgeon: Doran Stabler, MD;  Location: [facility];  Service: Gastroenterology;  Laterality: N/A;  . KNEE SURGERY  2005   2 times left  . KNEE SURGERY Right   . POLYPECTOMY  07/04/2020   Procedure: POLYPECTOMY;  Surgeon: Mauri Pole, MD;  Location: [facility];  Service: Endoscopy;;  . TUBAL LIGATION     Social History   Socioeconomic History  . Marital status: Single    Spouse name: Not on file  . Number of children: 2  . Years of education: Not on file  . Highest education level: Not on file  Occupational History  . Not on file  Tobacco Use  . Smoking status: Former Smoker    Packs/day: 1.00    Years: 15.00    Pack years: 15.00    Types: Cigarettes    Quit date: 09/12/2017    Years since quitting: 3.5  . Smokeless tobacco: Never Used  Vaping Use  . Vaping Use: Never used  Substance and Sexual Activity  . Alcohol use: Not Currently  . Drug use: [name]  . Sexual activity: Not Currently  Other Topics Concern  . 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)   Daughter - [name] - lives in the [place] system - family, aunt, [name] and [name] (who she lives with), brother   Transportation: roommates   Enjoy: [employer], playing with grandson   Exercise: PT exercises   Diet: good - chicken, some steak, tries to eat healthy, avoids fried foods   Retired from being a Quarry manager   Social Determinants of Radio broadcast assistant Strain: Not on file  Food Insecurity: Not on file  Transportation Needs: Not on file  Physical Activity: Not on file  Stress: Not on file  Social Connections: Not on file   Allergies  Allergen Reactions  . Meperidine Hives and Anaphylaxis  . Meperidine Hcl Anaphylaxis and Hives  . Strawberry Extract Hives and Swelling  . Carafate [Sucralfate] Nausea Only and Other (See Comments)     Severe nausea  . Strawberry (Diagnostic) Hives  . Wasp Venom Hives  . Wasp Venom Protein Hives   Family History  Problem Relation Age of Onset  . Breast cancer Mother 40  . Ovarian cancer Mother 46  . Brain cancer Mother   . Hypertension Father   . Hyperlipidemia Father   . Heart disease Father   . Colon cancer Father        diagnosed in his 83s  . Breast cancer Maternal Grandmother 109  . Diabetes Paternal Grandmother   . Breast cancer Paternal Grandmother 56  . Other Son        drug over dose  . Colon cancer Maternal Grandfather        dx in his 103s  . Liver disease Maternal Grandfather   . Breast cancer Maternal Aunt 60  . Breast cancer Maternal Aunt 60  . Colon cancer Maternal Uncle   . Stomach cancer Neg Hx   . Pancreatic cancer Neg Hx   . Esophageal cancer Neg Hx      Current Outpatient Medications (Cardiovascular):  .  atorvastatin (LIPITOR) 40 MG tablet, Take  40 mg by mouth daily.  .  carvedilol (COREG) 3.125 MG tablet, Take 3.125 mg by mouth in the morning and at bedtime. .  furosemide (LASIX) 40 MG tablet, Take 1 tablet (40 mg total) by mouth daily as needed for fluid or edema. (Patient taking differently: Take 40 mg by mouth in the morning.) .  telmisartan (MICARDIS) 40 MG tablet, Take 40 mg by mouth daily.  Current Outpatient Medications (Respiratory):  .  albuterol (VENTOLIN HFA) 108 (90 Base) MCG/ACT inhaler, Inhale 1-2 puffs into the lungs every 6 (six) hours as needed for wheezing or shortness of breath.  Current Outpatient Medications (Analgesics):  .  aspirin-acetaminophen-caffeine (EXCEDRIN MIGRAINE) 250-250-65 MG tablet, Take 1 tablet by mouth every 6 (six) hours as needed for headache or migraine.   Current Outpatient Medications (Other):  Marland Kitchen  ARIPiprazole (ABILIFY) 2 MG tablet, Take 2 mg by mouth daily. Marland Kitchen  dicyclomine (BENTYL) 10 MG capsule, TAKE 1 CAPSULE (10 MG TOTAL) BY MOUTH 4 (FOUR) TIMES DAILY - BEFORE MEALS AND AT BEDTIME. Marland Kitchen  FLUoxetine  (PROZAC) 40 MG capsule, Take 40 mg by mouth daily. Marland Kitchen  gabapentin (NEURONTIN) 400 MG capsule, Take 400 mg by mouth 3 (three) times daily as needed (for nerve pain). Marland Kitchen  LINZESS 145 MCG CAPS capsule, TAKE 1 CAPSULE (145 MCG TOTAL) BY MOUTH DAILY BEFORE BREAKFAST. (Patient taking differently: Take 145 mcg by mouth daily as needed (for constipation, or as otherwise instructed).) .  mirtazapine (REMERON) 15 MG tablet, Take 15 mg by mouth at bedtime. Marland Kitchen  omeprazole (PRILOSEC) 40 MG capsule, Take 1 capsule (40 mg total) by mouth daily. (Patient taking differently: Take 40 mg by mouth daily before breakfast.) .  ondansetron (ZOFRAN-ODT) 4 MG disintegrating tablet, Take 1-2 tablets by mouth every 6 hours as needed .  OXYGEN, Inhale 2 L/min into the lungs See admin instructions. 2 L/min at bedtime and as needed for shortness of breath throughout the day .  Potassium Chloride ER 20 MEQ TBCR, Take 20 mEq by mouth daily. .  psyllium (METAMUCIL SMOOTH TEXTURE) 58.6 % powder, Take 1 packet by mouth 3 (three) times daily. .  sucralfate (CARAFATE) 1 g tablet, TAKE 1 TABLET (1 G TOTAL) BY MOUTH 4 (FOUR) TIMES DAILY - WITH MEALS AND AT BEDTIME.   Reviewed prior external information including notes and imaging from  primary care provider As well as notes that were available from care everywhere and other healthcare systems.  Past medical history, social, surgical and family history all reviewed in electronic medical record.  [name] pertanent information unless stated regarding to the chief complaint.   Review of Systems:  [name] headache, visual changes, nausea, vomiting, diarrhea, constipation, dizziness,  skin rash, fevers, chills, night sweats, weight loss, swollen lymph nodes, body aches, joint swelling, chest pain, shortness of breath, mood changes. POSITIVE muscle aches, abdominal pain, body aches, joint swelling  Objective  Blood pressure 114/82, pulse 72, height 6\' 1"  (1.854 m), weight 245 lb (111.1 kg), SpO2 98  %.   General: [name] apparent distress alert and oriented x3 mood and affect normal, dressed appropriately.  Overweight HEENT: Pupils equal, extraocular movements intact  Respiratory: Patient's speak in full sentences and does not appear short of breath  Cardiovascular: [name] lower extremity edema, non tender, [name] erythema  Gait normal with good balance and coordination.  MSK: Moderate arthritic changes of multiple joints. Significant distention noted of the gut Left upper quadrant is even tender to palpation even to light palpation.  Severe tenderness though  over the 10th and 11th ribs and in the intercostal area.  This is also to light palpation that does seem to be out of proportion.  Mild voluntary guarding also noted.  Distention is noted throughout the entire abdomen.   Impression and Recommendations:     The above documentation has been reviewed and is accurate and complete Lyndal Pulley, DO

## 2021-03-20 ENCOUNTER — Encounter: Payer: Self-pay | Admitting: Family Medicine

## 2021-03-20 ENCOUNTER — Ambulatory Visit: Payer: Medicare HMO | Admitting: Family Medicine

## 2021-03-20 ENCOUNTER — Other Ambulatory Visit: Payer: Self-pay

## 2021-03-20 DIAGNOSIS — R109 Unspecified abdominal pain: Secondary | ICD-10-CM | POA: Diagnosis not present

## 2021-03-20 DIAGNOSIS — D509 Iron deficiency anemia, unspecified: Secondary | ICD-10-CM

## 2021-03-20 DIAGNOSIS — G8929 Other chronic pain: Secondary | ICD-10-CM

## 2021-03-20 NOTE — Patient Instructions (Signed)
Good to see you Vitamin D 2,000-4,000 Arnica lotion to the area See me again in 6 weeks

## 2021-03-20 NOTE — Assessment & Plan Note (Signed)
Likely some of it is secondary to her chronic kidney disease.  We did discuss potential referral to hematology if this continues as well.

## 2021-03-20 NOTE — Assessment & Plan Note (Addendum)
Patient has had this left upper quadrant chronic abdominal pain for quite some time at this point.  Patient has many different possibilities including congestive heart failure, ascending aortic aneurysm, chronic respiratory difficulties, chronic kidney disease and a history of CVA.  These could all in their own right cause some potential discomfort and pain.  Patient has had significant work-up by other providers at this point.  Most concerning would be the ascending aneurysm which patient has a follow-up with [facility] in 2 days.  At this point on exam patient did have some mild increase in discomfort seems to be over the ribs themselves in the intercostal area between ribs 10 and 11 on the left side.  Reviewed CT scan and did not see any true bone bony abnormality noted.  Patient does not remember any true injury.  No cortical irregularity noted and no crepitus.  Patient does point to more of that area which she is calling the knot which I think is more of the bone at the moment.  We discussed that the only thing she has not been treated for is the possibility of a intercostal.  Patient does have significant distention of the gait noted and I discussed that this could be also contributing still.  Patient will keep a food diary as well.  Given home exercises for more of the eccentric's and isometric exercises but avoid sit ups.  Discussed with patient we need to continue to monitor patient's other symptoms.  Follow-up with me again 6 weeks

## 2021-03-22 DIAGNOSIS — R69 Illness, unspecified: Secondary | ICD-10-CM | POA: Diagnosis not present

## 2021-03-25 DIAGNOSIS — I509 Heart failure, unspecified: Secondary | ICD-10-CM | POA: Diagnosis not present

## 2021-03-25 DIAGNOSIS — J449 Chronic obstructive pulmonary disease, unspecified: Secondary | ICD-10-CM | POA: Diagnosis not present

## 2021-03-26 ENCOUNTER — Encounter: Payer: Self-pay | Admitting: Emergency Medicine

## 2021-03-26 ENCOUNTER — Emergency Department
Admission: EM | Admit: 2021-03-26 | Discharge: 2021-03-26 | Disposition: A | Discharge status: Home / Self Care | Payer: Medicare HMO | Attending: Emergency Medicine | Admitting: Emergency Medicine

## 2021-03-26 ENCOUNTER — Other Ambulatory Visit: Payer: Self-pay

## 2021-03-26 ENCOUNTER — Emergency Department: Payer: Medicare HMO

## 2021-03-26 DIAGNOSIS — N183 Chronic kidney disease, stage 3 unspecified: Secondary | ICD-10-CM | POA: Diagnosis not present

## 2021-03-26 DIAGNOSIS — Z87891 Personal history of nicotine dependence: Secondary | ICD-10-CM | POA: Diagnosis not present

## 2021-03-26 DIAGNOSIS — X509XXA Other and unspecified overexertion or strenuous movements or postures, initial encounter: Secondary | ICD-10-CM | POA: Insufficient documentation

## 2021-03-26 DIAGNOSIS — I5032 Chronic diastolic (congestive) heart failure: Secondary | ICD-10-CM | POA: Insufficient documentation

## 2021-03-26 DIAGNOSIS — S8391XA Sprain of unspecified site of right knee, initial encounter: Secondary | ICD-10-CM | POA: Diagnosis not present

## 2021-03-26 DIAGNOSIS — S8991XA Unspecified injury of right lower leg, initial encounter: Secondary | ICD-10-CM | POA: Diagnosis not present

## 2021-03-26 DIAGNOSIS — J449 Chronic obstructive pulmonary disease, unspecified: Secondary | ICD-10-CM | POA: Diagnosis not present

## 2021-03-26 DIAGNOSIS — I130 Hypertensive heart and chronic kidney disease with heart failure and stage 1 through stage 4 chronic kidney disease, or unspecified chronic kidney disease: Secondary | ICD-10-CM | POA: Diagnosis not present

## 2021-03-26 DIAGNOSIS — Z7982 Long term (current) use of aspirin: Secondary | ICD-10-CM | POA: Diagnosis not present

## 2021-03-26 DIAGNOSIS — M7989 Other specified soft tissue disorders: Secondary | ICD-10-CM | POA: Diagnosis not present

## 2021-03-26 MED ORDER — OXYCODONE HCL 5 MG PO TABS: 5.0000 mg | ORAL_TABLET | Freq: Four times a day (QID) | ORAL | 0 refills | Status: DC | PRN

## 2021-03-26 NOTE — ED Triage Notes (Signed)
Pt reports was sitting down and adjusted in her seat and when she did she twisted her knee, felt a pop and has been hurting since then.

## 2021-03-26 NOTE — ED Provider Notes (Signed)
[facility] Emergency Department Provider Note  ____________________________________________   Event Date/Time   First MD Initiated Contact with Patient 03/26/21 1113     (approximate)  I have reviewed the triage vital signs and the nursing notes.   HISTORY  Chief Complaint Knee Pain   HPI Morgan [name] is a 70 y.o. female presents to the ED with complaint of right knee pain.  Patient states that she was trying to adjust a chair that she was sitting in and believes she twisted her knee at that time.  She has continued to have pain to the medial aspect of her knee.  She reports that she has had 2 surgeries on her knee in the past and sees an orthopedist in [place] but has not had any recent problems.  She has continued to ambulate without any assistance.  She rates her pain as an 8 out of 10.       Past Medical History:  Diagnosis Date  . Allergy   . Anemia   . Anxiety   . Arthritis   . CHF (congestive heart failure) ([place])   . Depression   . GERD (gastroesophageal reflux disease)   . Hypertension   . MVP (mitral valve prolapse)     Patient Active Problem List   Diagnosis Date Noted  . Chest pressure 02/20/2021  . Left sided abdominal pain 02/01/2021  . Chronic diastolic CHF (congestive heart failure) ([place]) 08/09/2020  . COPD (chronic obstructive pulmonary disease) ([place]) 08/09/2020  . Chronic respiratory failure with hypoxia ([place]) 08/09/2020  . Polyp of sigmoid colon   . Polyp of cecum   . Gastritis and gastroduodenitis   . Pylorus ulcer   . Pyloric stenosis in adult   . Chronic cholecystitis 05/19/2020  . Oxygen dependent 03/30/2020  . Nausea and vomiting in adult 03/22/2020  . Peptic ulcer disease 03/22/2020  . Iron deficiency anemia 03/22/2020  . Acute pain of right knee 12/29/2019  . Acute left-sided low back pain without sciatica 12/29/2019  . Fall 04/08/2019  . Chronic abdominal pain 02/19/2019  . Asymptomatic bacteriuria  02/19/2019  . CKD (chronic kidney disease) stage 3, GFR 30-59 ml/min (HCC) 01/13/2019  . CHF (congestive heart failure) ([place]) 01/13/2019  . Dizziness 01/13/2019  . Hoarseness 11/12/2018  . Dysphagia 11/12/2018  . Sleepiness 11/12/2018  . CVA (cerebral vascular accident) ([place]) 08/22/2018  . Left-sided weakness 10/04/2017  . HLD (hyperlipidemia) 10/04/2017  . GERD (gastroesophageal reflux disease) 10/04/2017  . Anxiety 08/26/2017  . Essential hypertension   . Depression   . Diarrhea 02/22/2015  . Hypokalemia 02/22/2015  . Chronic lower back pain 04/29/2013  . Paresthesia 04/29/2013    Past Surgical History:  Procedure Laterality Date  . ABDOMINAL HYSTERECTOMY    . APPENDECTOMY    . BALLOON DILATION N/A 04/21/2020   Procedure: BALLOON DILATION;  Surgeon: Milus Banister, MD;  Location: [facility];  Service: Endoscopy;  Laterality: N/A;  pyloric/ GI  . BALLOON DILATION N/A 07/04/2020   Procedure: BALLOON DILATION;  Surgeon: Mauri Pole, MD;  Location: [facility];  Service: Endoscopy;  Laterality: N/A;  . BIOPSY  04/21/2020   Procedure: BIOPSY;  Surgeon: Milus Banister, MD;  Location: [facility];  Service: Endoscopy;;  . BIOPSY  07/04/2020   Procedure: BIOPSY;  Surgeon: Mauri Pole, MD;  Location: [facility];  Service: Endoscopy;;  EGD and COLON  . BREAST SURGERY Right 1988   tumor removal  . CHOLECYSTECTOMY N/A 05/18/2020  Procedure: LAPAROSCOPIC CHOLECYSTECTOMY;  Surgeon: Johnathan Hausen, MD;  Location: [facility];  Service: General;  Laterality: N/A;  . COLONOSCOPY WITH PROPOFOL N/A 07/04/2020   Procedure: COLONOSCOPY WITH PROPOFOL;  Surgeon: Mauri Pole, MD;  Location: [facility];  Service: Endoscopy;  Laterality: N/A;  . ESOPHAGEAL DILATION  08/11/2020   Procedure: PYLORIC DILATION;  Surgeon: Doran Stabler, MD;  Location: [facility];  Service: Gastroenterology;;  . ESOPHAGOGASTRODUODENOSCOPY (EGD) WITH PROPOFOL N/A 04/21/2020   Procedure:  ESOPHAGOGASTRODUODENOSCOPY (EGD) WITH PROPOFOL;  Surgeon: Milus Banister, MD;  Location: [facility];  Service: Endoscopy;  Laterality: N/A;  . ESOPHAGOGASTRODUODENOSCOPY (EGD) WITH PROPOFOL N/A 07/04/2020   Procedure: ESOPHAGOGASTRODUODENOSCOPY (EGD) WITH PROPOFOL;  Surgeon: Mauri Pole, MD;  Location: [facility];  Service: Endoscopy;  Laterality: N/A;  . ESOPHAGOGASTRODUODENOSCOPY (EGD) WITH PROPOFOL N/A 08/11/2020   Procedure: ESOPHAGOGASTRODUODENOSCOPY (EGD) WITH PROPOFOL;  Surgeon: Doran Stabler, MD;  Location: [facility];  Service: Gastroenterology;  Laterality: N/A;  . KNEE SURGERY  2005   2 times left  . KNEE SURGERY Right   . POLYPECTOMY  07/04/2020   Procedure: POLYPECTOMY;  Surgeon: Mauri Pole, MD;  Location: [facility];  Service: Endoscopy;;  . TUBAL LIGATION      Prior to Admission medications   Medication Sig Start Date End Date Taking? Authorizing Provider  oxyCODONE (ROXICODONE) 5 MG immediate release tablet Take 1 tablet (5 mg total) by mouth every 6 (six) hours as needed. 03/26/21 03/26/22 Yes Kyzen Horn L, PA-C  albuterol (VENTOLIN HFA) 108 (90 Base) MCG/ACT inhaler Inhale 1-2 puffs into the lungs every 6 (six) hours as needed for wheezing or shortness of breath. 10/31/20   Tasia Catchings, Amy V, PA-C  ARIPiprazole (ABILIFY) 2 MG tablet Take 2 mg by mouth daily. 07/14/20   [provider]  aspirin-acetaminophen-caffeine (EXCEDRIN MIGRAINE) [id number] MG tablet Take 1 tablet by mouth every 6 (six) hours as needed for headache or migraine.    [provider]  atorvastatin (LIPITOR) 40 MG tablet Take 40 mg by mouth daily.     [provider]  carvedilol (COREG) 3.125 MG tablet Take 3.125 mg by mouth in the morning and at bedtime.    [provider]  dicyclomine (BENTYL) 10 MG capsule TAKE 1 CAPSULE (10 MG TOTAL) BY MOUTH 4 (FOUR) TIMES DAILY - BEFORE MEALS AND AT BEDTIME. 03/07/21   [name], MD  FLUoxetine (PROZAC) 40  MG capsule Take 40 mg by mouth daily.    [provider]  furosemide (LASIX) 40 MG tablet Take 1 tablet (40 mg total) by mouth daily as needed for fluid or edema. Patient taking differently: Take 40 mg by mouth in the morning. 08/22/20   Mercy Riding, MD  gabapentin (NEURONTIN) 400 MG capsule Take 400 mg by mouth 3 (three) times daily as needed (for nerve pain). 10/20/18   [provider]  LINZESS 145 MCG CAPS capsule TAKE 1 CAPSULE (145 MCG TOTAL) BY MOUTH DAILY BEFORE BREAKFAST. Patient taking differently: Take 145 mcg by mouth daily as needed (for constipation, or as otherwise instructed). 01/03/21   Mauri Pole, MD  mirtazapine (REMERON) 15 MG tablet Take 15 mg by mouth at bedtime. 10/20/20   [provider]  omeprazole (PRILOSEC) 40 MG capsule Take 1 capsule (40 mg total) by mouth daily. Patient taking differently: Take 40 mg by mouth daily before breakfast. 08/29/20   [name], MD  ondansetron (ZOFRAN-ODT) 4 MG disintegrating tablet Take 1-2 tablets by  mouth every 6 hours as needed 03/03/21   Levin Erp, PA  OXYGEN Inhale 2 L/min into the lungs See admin instructions. 2 L/min at bedtime and as needed for shortness of breath throughout the day    [provider]  Potassium Chloride ER 20 MEQ TBCR Take 20 mEq by mouth daily. 02/05/21   Lorin Glass, PA-C  psyllium (METAMUCIL SMOOTH TEXTURE) 58.6 % powder Take 1 packet by mouth 3 (three) times daily. 08/18/20   Mercy Riding, MD  sucralfate (CARAFATE) 1 g tablet TAKE 1 TABLET (1 G TOTAL) BY MOUTH 4 (FOUR) TIMES DAILY - WITH MEALS AND AT BEDTIME. 12/15/20   Nandigam, Venia Minks, MD  telmisartan (MICARDIS) 40 MG tablet Take 40 mg by mouth daily. 02/23/21   [provider]    Allergies Meperidine, Meperidine hcl, Strawberry extract, Carafate [sucralfate], Strawberry (diagnostic), Wasp venom, and Wasp venom protein  Family History  Problem Relation Age of Onset  . Breast cancer  Mother 75  . Ovarian cancer Mother 35  . Brain cancer Mother   . Hypertension Father   . Hyperlipidemia Father   . Heart disease Father   . Colon cancer Father        diagnosed in his 54s  . Breast cancer Maternal Grandmother 28  . Diabetes Paternal Grandmother   . Breast cancer Paternal Grandmother 76  . Other Son        drug over dose  . Colon cancer Maternal Grandfather        dx in his 89s  . Liver disease Maternal Grandfather   . Breast cancer Maternal Aunt 60  . Breast cancer Maternal Aunt 60  . Colon cancer Maternal Uncle   . Stomach cancer Neg Hx   . Pancreatic cancer Neg Hx   . Esophageal cancer Neg Hx     Social History Social History   Tobacco Use  . Smoking status: Former Smoker    Packs/day: 1.00    Years: 15.00    Pack years: 15.00    Types: Cigarettes    Quit date: 09/12/2017    Years since quitting: 3.5  . Smokeless tobacco: Never Used  Vaping Use  . Vaping Use: Never used  Substance Use Topics  . Alcohol use: Not Currently  . Drug use: No    Review of Systems Constitutional: No fever/chills Eyes: No visual changes. Cardiovascular: Denies chest pain. Respiratory: Denies shortness of breath. Gastrointestinal: No abdominal pain.  No nausea, no vomiting. Musculoskeletal: Positive for right knee pain. Skin: Negative for rash. Neurological: Negative for headaches, focal weakness or numbness. ____________________________________________   PHYSICAL EXAM:  VITAL SIGNS: ED Triage Vitals  Enc Vitals Group     BP 03/26/21 1101 (!) 150/92     Pulse Rate 03/26/21 1101 66     Resp 03/26/21 1101 16     Temp 03/26/21 1101 98.2 F (36.8 C)     Temp Source 03/26/21 1101 Oral     SpO2 03/26/21 1101 99 %     Weight 03/26/21 1056 245 lb (111.1 kg)     Height 03/26/21 1056 6\' 1"  (1.854 m)     Head Circumference --      Peak Flow --      Pain Score 03/26/21 1056 8     Pain Loc --      Pain Edu? --      Excl. in [place]? --     Constitutional: Alert and  oriented. Well appearing and in  no acute distress. Eyes: Conjunctivae are normal.  Head: Atraumatic. Neck: No stridor.   Cardiovascular: Normal rate, regular rhythm. Grossly normal heart sounds.  Good peripheral circulation. Respiratory: Normal respiratory effort.  No retractions. Lungs CTAB. Gastrointestinal: Soft and nontender. No distention.  Musculoskeletal: Examination of the right knee there is no gross deformity noted and no effusion present.  There is moderate amount of crepitus with range of motion but patient is able to flex and extend without any restriction.  Skin is intact.  No skin discoloration is present.  Pulses are present distal to her pain. Neurologic:  Normal speech and language. No gross focal neurologic deficits are appreciated.  Skin:  Skin is warm, dry and intact. No rash noted. Psychiatric: Mood and affect are normal. Speech and behavior are normal.  ____________________________________________   LABS (all labs ordered are listed, but only abnormal results are displayed)  Labs Reviewed - No data to display ____________________________________________  RADIOLOGY I, Johnn Hai, personally viewed and evaluated these images (plain radiographs) as part of my medical decision making, as well as reviewing the written report by the radiologist.   Official radiology report(s): DG Knee Complete 4 Views Right  Result Date: 03/26/2021 CLINICAL DATA:  Pain post twisting injury. EXAM: RIGHT KNEE - COMPLETE 4+ VIEW COMPARISON:  None. FINDINGS: No evidence of fracture, dislocation, or joint effusion. No evidence of arthropathy or other focal bone abnormality. Mild suprapatellar soft tissue swelling. IMPRESSION: 1. No acute fracture or dislocation identified about the right knee. 2. Mild suprapatellar soft tissue swelling. Electronically Signed   By: Fidela Salisbury M.D.   On: 03/26/2021 12:17     ____________________________________________   PROCEDURES  Procedure(s) performed (including Critical Care):  Procedures Knee immobilizer and crutches for patient in the ED.  ____________________________________________   INITIAL IMPRESSION / ASSESSMENT AND PLAN / ED COURSE  As part of my medical decision making, I reviewed the following data within the electronic MEDICAL RECORD NUMBER Notes from prior ED visits and [place] Controlled Substance Database  70 year old female presents to the ED with complaint of right knee pain that started when she sat down to adjust her seat and twisted her knee.  She states she felt a pop and has been hurting since.  She has an orthopedist in [place] that she sees for knee problems in the past but has had no problems recently.  Patient continues to ambulate without any assistance.  X-rays are negative for any bony injury.  Patient does have moderate amount of crepitus with range of motion but is able to fully extend and flex her knee.  A knee immobilizer was applied and patient is going to be following up with her orthopedist in [place].  A prescription for oxycodone 5 mg every 6 hours was sent to her pharmacy as needed for pain.  She is also encouraged to use ice and elevate as needed.  ____________________________________________   FINAL CLINICAL IMPRESSION(S) / ED DIAGNOSES  Final diagnoses:  Sprain of right knee, unspecified ligament, initial encounter     ED Discharge Orders         Ordered    oxyCODONE (ROXICODONE) 5 MG immediate release tablet  Every 6 hours PRN        03/26/21 1238          *Please note:  Morgan [name] was evaluated in Emergency Department on 03/26/2021 for the symptoms described in the history of present illness. She was evaluated in the context of the global COVID-19 pandemic,  which necessitated consideration that the patient might be at risk for infection with the SARS-CoV-2 virus that causes COVID-19. Institutional  protocols and algorithms that pertain to the evaluation of patients at risk for COVID-19 are in a state of rapid change based on information released by regulatory bodies including the CDC and federal and state organizations. These policies and algorithms were followed during the patient's care in the ED.  Some ED evaluations and interventions may be delayed as a result of limited staffing during and the pandemic.*   Note:  This document was prepared using Dragon voice recognition software and may include unintentional dictation errors.    Johnn Hai, PA-C 03/26/21 1311    Arta Silence, MD 03/26/21 [phone]

## 2021-03-26 NOTE — Discharge Instructions (Signed)
Follow-up with your primary care provider or your orthopedist in [place] if any continued problems with your right knee.  Ice and elevation as needed for swelling and to help with pain.  A knee immobilizer was placed on your knee for added support and protection.  You do not have to wear this while you are sleeping but do wear it when you are up walking to prevent any further injury.  A prescription for pain medication was sent to your pharmacy.  Do not drive or operate machinery while taking this medication as it could cause drowsiness.  Also be aware that because of drowsiness it could increase your risk for fall.

## 2021-03-30 DIAGNOSIS — S8411XA Injury of peroneal nerve at lower leg level, right leg, initial encounter: Secondary | ICD-10-CM | POA: Diagnosis not present

## 2021-03-30 DIAGNOSIS — S83411A Sprain of medial collateral ligament of right knee, initial encounter: Secondary | ICD-10-CM | POA: Diagnosis not present

## 2021-04-01 ENCOUNTER — Other Ambulatory Visit: Payer: Self-pay | Admitting: Family Medicine

## 2021-04-01 DIAGNOSIS — G8929 Other chronic pain: Secondary | ICD-10-CM

## 2021-04-04 DIAGNOSIS — M23300 Other meniscus derangements, unspecified lateral meniscus, right knee: Secondary | ICD-10-CM | POA: Diagnosis not present

## 2021-04-04 DIAGNOSIS — M25561 Pain in right knee: Secondary | ICD-10-CM | POA: Diagnosis not present

## 2021-04-06 DIAGNOSIS — M25561 Pain in right knee: Secondary | ICD-10-CM | POA: Diagnosis not present

## 2021-04-08 IMAGING — CR DG CHEST 2V
1 series · 2 of 2 positions shown · non-contrast
Comparison: February 18, 2019

CLINICAL DATA: Shortness of breath with cough and chest pain

EXAM:
CHEST - 2 VIEW

[Series 1: w chest pa · 0.14mm/px · 2 of 2 slices shown]
[im 1/2]
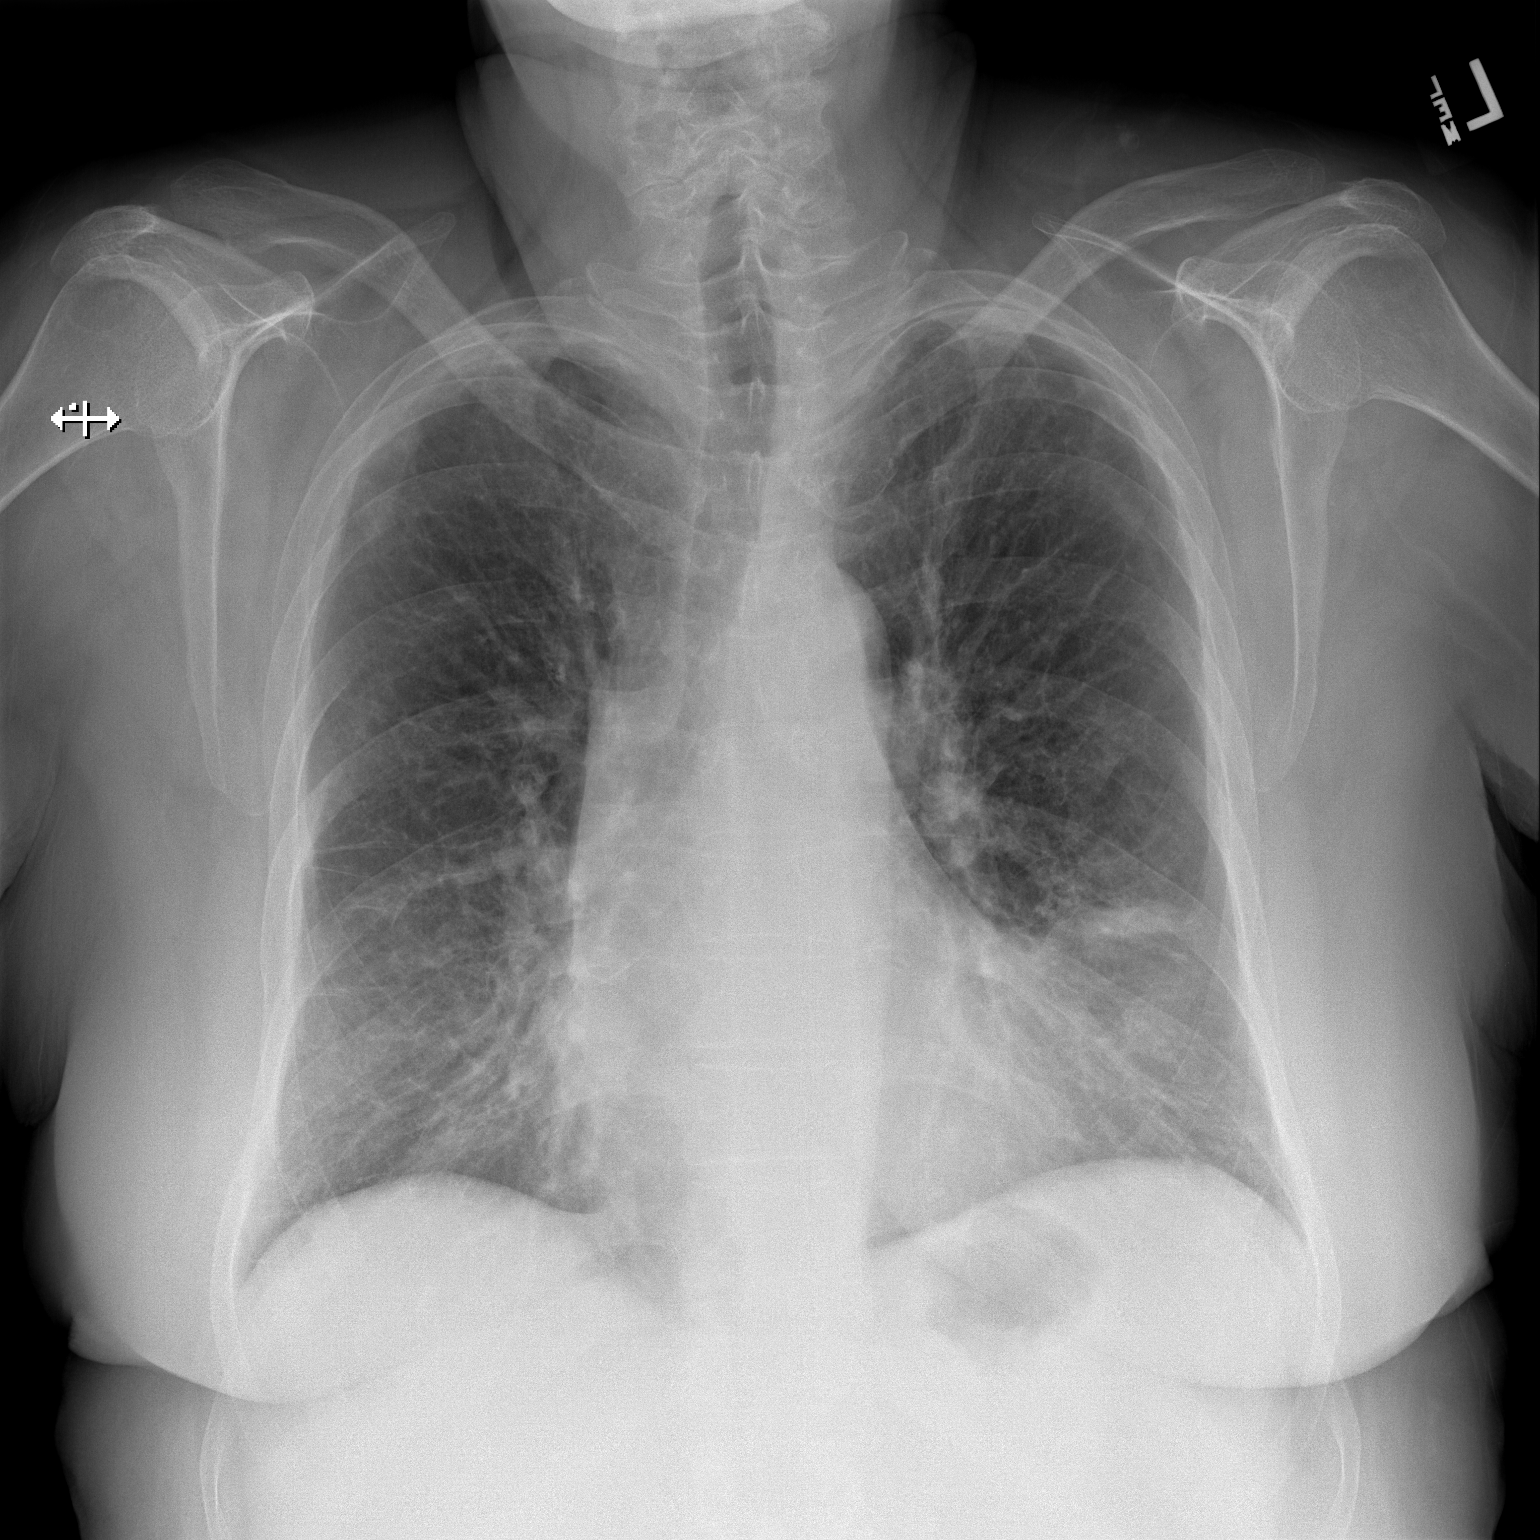
[im 2/2]
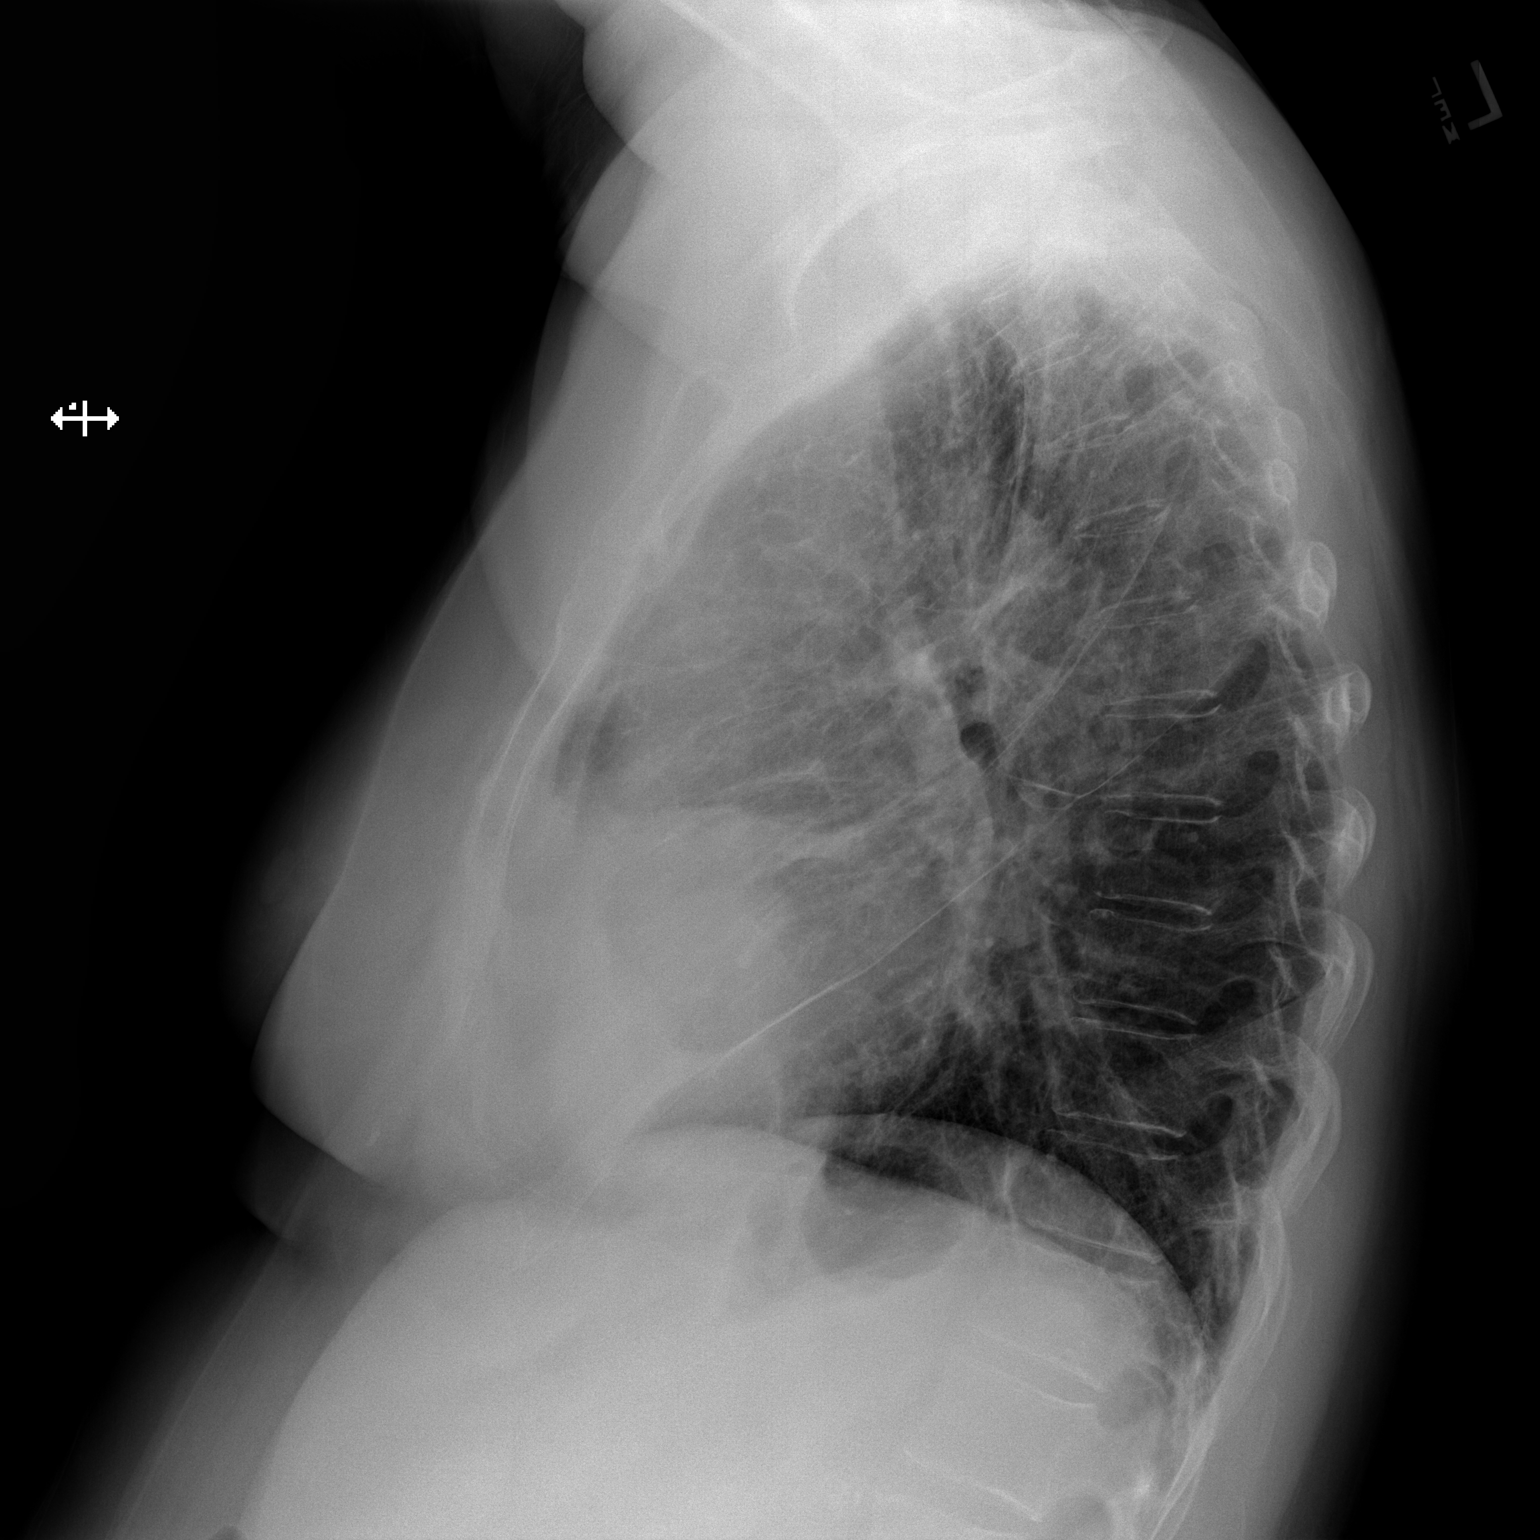

[2 of 2 positions shown; findings below may reference images not displayed]

FINDINGS: There is focal consolidation in the inferior lingula. There is mild
bibasilar atelectasis. Heart size and pulmonary vascularity are
normal. No adenopathy. No bone lesions.
IMPRESSION: Focal consolidation consistent with pneumonia in the inferior
lingula. Areas of mild atelectatic change. Cardiac silhouette within
normal limits. No adenopathy.

Followup PA and lateral chest radiographs recommended in 3-4 weeks
following trial of antibiotic therapy to ensure resolution and
exclude underlying malignancy.

## 2021-04-08 NOTE — Progress Notes (Signed)
Reviewed and agree with documentation and assessment and plan. K. Veena Akshath Mccarey , MD   

## 2021-04-12 DIAGNOSIS — I5021 Acute systolic (congestive) heart failure: Secondary | ICD-10-CM | POA: Diagnosis not present

## 2021-04-19 DIAGNOSIS — S83411A Sprain of medial collateral ligament of right knee, initial encounter: Secondary | ICD-10-CM | POA: Diagnosis not present

## 2021-04-21 DIAGNOSIS — I5021 Acute systolic (congestive) heart failure: Secondary | ICD-10-CM | POA: Diagnosis not present

## 2021-04-24 DIAGNOSIS — J449 Chronic obstructive pulmonary disease, unspecified: Secondary | ICD-10-CM | POA: Diagnosis not present

## 2021-04-24 DIAGNOSIS — I509 Heart failure, unspecified: Secondary | ICD-10-CM | POA: Diagnosis not present

## 2021-04-26 ENCOUNTER — Other Ambulatory Visit: Payer: Self-pay | Admitting: Family Medicine

## 2021-04-26 DIAGNOSIS — G8929 Other chronic pain: Secondary | ICD-10-CM

## 2021-04-26 NOTE — Telephone Encounter (Signed)
Earliest Fill Date: 06/03/2021

## 2021-04-27 DIAGNOSIS — M25561 Pain in right knee: Secondary | ICD-10-CM | POA: Diagnosis not present

## 2021-04-28 DIAGNOSIS — M170 Bilateral primary osteoarthritis of knee: Secondary | ICD-10-CM | POA: Diagnosis not present

## 2021-04-28 DIAGNOSIS — M1711 Unilateral primary osteoarthritis, right knee: Secondary | ICD-10-CM | POA: Diagnosis not present

## 2021-05-03 NOTE — Progress Notes (Deleted)
Morgan [name] Phone: [phone] Subjective:    I'm seeing this patient by the request  of:  [name], MD  CC:   IDP:[id number]   03/20/2021 Patient has had this left upper quadrant chronic abdominal pain for quite some time at this point.  Patient has many different possibilities including congestive heart failure, ascending aortic aneurysm, chronic respiratory difficulties, chronic kidney disease and a history of CVA.  These could all in their own right cause some potential discomfort and pain.  Patient has had significant work-up by other providers at this point.  Most concerning would be the ascending aneurysm which patient has a follow-up with [facility] in 2 days.  At this point on exam patient did have some mild increase in discomfort seems to be over the ribs themselves in the intercostal area between ribs 10 and 11 on the left side.  Reviewed CT scan and did not see any true bone bony abnormality noted.  Patient does not remember any true injury.  No cortical irregularity noted and no crepitus.  Patient does point to more of that area which she is calling the knot which I think is more of the bone at the moment.  We discussed that the only thing she has not been treated for is the possibility of a intercostal.  Patient does have significant distention of the gait noted and I discussed that this could be also contributing still.  Patient will keep a food diary as well.  Given home exercises for more of the eccentric's and isometric exercises but avoid sit ups.  Discussed with patient we need to continue to monitor patient's other symptoms.  Follow-up with me again 6 weeks  Likely some of it is secondary to her chronic kidney disease.  We did discuss potential referral to hematology if this continues as well.  Update 05/05/2021 Morgan [name] is a 70 y.o. female coming in with complaint of chronic abdominal pain. Patient  states   Patient did have a nuclear stress test done that was unremarkable. Echocardiogram also normal.   Patient did have an injury of her ankle since we have seen her last.  Once again patient did have a CT angio of the chest done in March 2022 showing an ascending aortic aneurysm with a maximal dimension of 4.5 cm continue to follow-up with [facility] per patient.  Patient 2 days after seen noticed did have a fall and a 3.  Had an x-ray on March 26, 2021 showing a mild suprapatellar soft tissue swelling.  Past Medical History:  Diagnosis Date  . Allergy   . Anemia   . Anxiety   . Arthritis   . CHF (congestive heart failure) ([place])   . Depression   . GERD (gastroesophageal reflux disease)   . Hypertension   . MVP (mitral valve prolapse)    Past Surgical History:  Procedure Laterality Date  . ABDOMINAL HYSTERECTOMY    . APPENDECTOMY    . BALLOON DILATION N/A 04/21/2020   Procedure: BALLOON DILATION;  Surgeon: Milus Banister, MD;  Location: [facility];  Service: Endoscopy;  Laterality: N/A;  pyloric/ GI  . BALLOON DILATION N/A 07/04/2020   Procedure: BALLOON DILATION;  Surgeon: Mauri Pole, MD;  Location: [facility];  Service: Endoscopy;  Laterality: N/A;  . BIOPSY  04/21/2020   Procedure: BIOPSY;  Surgeon: Milus Banister, MD;  Location: [facility];  Service: Endoscopy;;  . BIOPSY  07/04/2020  Procedure: BIOPSY;  Surgeon: Mauri Pole, MD;  Location: [facility];  Service: Endoscopy;;  EGD and COLON  . BREAST SURGERY Right 1988   tumor removal  . CHOLECYSTECTOMY N/A 05/18/2020   Procedure: LAPAROSCOPIC CHOLECYSTECTOMY;  Surgeon: Johnathan Hausen, MD;  Location: [facility];  Service: General;  Laterality: N/A;  . COLONOSCOPY WITH PROPOFOL N/A 07/04/2020   Procedure: COLONOSCOPY WITH PROPOFOL;  Surgeon: Mauri Pole, MD;  Location: [facility];  Service: Endoscopy;  Laterality: N/A;  . ESOPHAGEAL DILATION  08/11/2020   Procedure: PYLORIC DILATION;  Surgeon:  Doran Stabler, MD;  Location: [facility];  Service: Gastroenterology;;  . ESOPHAGOGASTRODUODENOSCOPY (EGD) WITH PROPOFOL N/A 04/21/2020   Procedure: ESOPHAGOGASTRODUODENOSCOPY (EGD) WITH PROPOFOL;  Surgeon: Milus Banister, MD;  Location: [facility];  Service: Endoscopy;  Laterality: N/A;  . ESOPHAGOGASTRODUODENOSCOPY (EGD) WITH PROPOFOL N/A 07/04/2020   Procedure: ESOPHAGOGASTRODUODENOSCOPY (EGD) WITH PROPOFOL;  Surgeon: Mauri Pole, MD;  Location: [facility];  Service: Endoscopy;  Laterality: N/A;  . ESOPHAGOGASTRODUODENOSCOPY (EGD) WITH PROPOFOL N/A 08/11/2020   Procedure: ESOPHAGOGASTRODUODENOSCOPY (EGD) WITH PROPOFOL;  Surgeon: Doran Stabler, MD;  Location: [facility];  Service: Gastroenterology;  Laterality: N/A;  . KNEE SURGERY  2005   2 times left  . KNEE SURGERY Right   . POLYPECTOMY  07/04/2020   Procedure: POLYPECTOMY;  Surgeon: Mauri Pole, MD;  Location: [facility];  Service: Endoscopy;;  . TUBAL LIGATION     Social History   Socioeconomic History  . Marital status: Single    Spouse name: Not on file  . Number of children: 2  . Years of education: Not on file  . Highest education level: Not on file  Occupational History  . Not on file  Tobacco Use  . Smoking status: Former Smoker    Packs/day: 1.00    Years: 15.00    Pack years: 15.00    Types: Cigarettes    Quit date: 09/12/2017    Years since quitting: 3.6  . Smokeless tobacco: Never Used  Vaping Use  . Vaping Use: Never used  Substance and Sexual Activity  . Alcohol use: Not Currently  . Drug use: No  . Sexual activity: Not Currently  Other Topics Concern  . 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)   Daughter - [name] - lives in the [place] system - family, aunt, [name] and [name] (who she lives with), brother   Transportation: roommates   Enjoy: [employer], playing with grandson    Exercise: PT exercises   Diet: good - chicken, some steak, tries to eat healthy, avoids fried foods   Retired from being a Quarry manager   Social Determinants of Radio broadcast assistant Strain: Not on file  Food Insecurity: Not on file  Transportation Needs: Not on file  Physical Activity: Not on file  Stress: Not on file  Social Connections: Not on file   Allergies  Allergen Reactions  . Meperidine Hives and Anaphylaxis  . Meperidine Hcl Anaphylaxis and Hives  . Strawberry Extract Hives and Swelling  . Carafate [Sucralfate] Nausea Only and Other (See Comments)    Severe nausea  . Strawberry (Diagnostic) Hives  . Wasp Venom Hives  . Wasp Venom Protein Hives   Family History  Problem Relation Age of Onset  . Breast cancer Mother 28  . Ovarian cancer Mother 47  . Brain cancer Mother   .  Hypertension Father   . Hyperlipidemia Father   . Heart disease Father   . Colon cancer Father        diagnosed in his 75s  . Breast cancer Maternal Grandmother 23  . Diabetes Paternal Grandmother   . Breast cancer Paternal Grandmother 12  . Other Son        drug over dose  . Colon cancer Maternal Grandfather        dx in his 23s  . Liver disease Maternal Grandfather   . Breast cancer Maternal Aunt 60  . Breast cancer Maternal Aunt 60  . Colon cancer Maternal Uncle   . Stomach cancer Neg Hx   . Pancreatic cancer Neg Hx   . Esophageal cancer Neg Hx      Current Outpatient Medications (Cardiovascular):  .  atorvastatin (LIPITOR) 40 MG tablet, Take 40 mg by mouth daily.  .  carvedilol (COREG) 3.125 MG tablet, Take 3.125 mg by mouth in the morning and at bedtime. .  furosemide (LASIX) 40 MG tablet, Take 1 tablet (40 mg total) by mouth daily as needed for fluid or edema. (Patient taking differently: Take 40 mg by mouth in the morning.) .  telmisartan (MICARDIS) 40 MG tablet, Take 40 mg by mouth daily.  Current Outpatient Medications (Respiratory):  .  albuterol (VENTOLIN HFA) 108 (90 Base)  MCG/ACT inhaler, Inhale 1-2 puffs into the lungs every 6 (six) hours as needed for wheezing or shortness of breath.  Current Outpatient Medications (Analgesics):  .  aspirin-acetaminophen-caffeine (EXCEDRIN MIGRAINE) 250-250-65 MG tablet, Take 1 tablet by mouth every 6 (six) hours as needed for headache or migraine. Marland Kitchen  oxyCODONE (ROXICODONE) 5 MG immediate release tablet, Take 1 tablet (5 mg total) by mouth every 6 (six) hours as needed.   Current Outpatient Medications (Other):  Marland Kitchen  ARIPiprazole (ABILIFY) 2 MG tablet, Take 2 mg by mouth daily. Marland Kitchen  dicyclomine (BENTYL) 10 MG capsule, TAKE 1 CAPSULE (10 MG TOTAL) BY MOUTH 4 (FOUR) TIMES DAILY - BEFORE MEALS AND AT BEDTIME. Marland Kitchen  FLUoxetine (PROZAC) 40 MG capsule, Take 40 mg by mouth daily. Marland Kitchen  gabapentin (NEURONTIN) 400 MG capsule, Take 400 mg by mouth 3 (three) times daily as needed (for nerve pain). Marland Kitchen  LINZESS 145 MCG CAPS capsule, TAKE 1 CAPSULE (145 MCG TOTAL) BY MOUTH DAILY BEFORE BREAKFAST. (Patient taking differently: Take 145 mcg by mouth daily as needed (for constipation, or as otherwise instructed).) .  mirtazapine (REMERON) 15 MG tablet, Take 15 mg by mouth at bedtime. Marland Kitchen  omeprazole (PRILOSEC) 40 MG capsule, Take 1 capsule (40 mg total) by mouth daily. (Patient taking differently: Take 40 mg by mouth daily before breakfast.) .  ondansetron (ZOFRAN-ODT) 4 MG disintegrating tablet, Take 1-2 tablets by mouth every 6 hours as needed .  OXYGEN, Inhale 2 L/min into the lungs See admin instructions. 2 L/min at bedtime and as needed for shortness of breath throughout the day .  Potassium Chloride ER 20 MEQ TBCR, Take 20 mEq by mouth daily. .  psyllium (METAMUCIL SMOOTH TEXTURE) 58.6 % powder, Take 1 packet by mouth 3 (three) times daily. .  sucralfate (CARAFATE) 1 g tablet, TAKE 1 TABLET (1 G TOTAL) BY MOUTH 4 (FOUR) TIMES DAILY - WITH MEALS AND AT BEDTIME.   Reviewed prior external information including notes and imaging from  primary care  provider As well as notes that were available from care everywhere and other healthcare systems.  Past medical history, social, surgical and family history all  reviewed in electronic medical record.  No pertanent information unless stated regarding to the chief complaint.   Review of Systems:  No headache, visual changes, nausea, vomiting, diarrhea, constipation, dizziness, abdominal pain, skin rash, fevers, chills, night sweats, weight loss, swollen lymph nodes, body aches, joint swelling, chest pain, shortness of breath, mood changes. POSITIVE muscle aches  Objective  There were no vitals taken for this visit.   General: No apparent distress alert and oriented x3 mood and affect normal, dressed appropriately.  HEENT: Pupils equal, extraocular movements intact  Respiratory: Patient's speak in full sentences and does not appear short of breath  Cardiovascular: No lower extremity edema, non tender, no erythema  Gait normal with good balance and coordination.  MSK:  Non tender with full range of motion and good stability and symmetric strength and tone of shoulders, elbows, wrist, hip, knee and ankles bilaterally.     Impression and Recommendations:     The above documentation has been reviewed and is accurate and complete Lyndal Pulley, DO

## 2021-05-05 ENCOUNTER — Ambulatory Visit: Payer: Medicare HMO | Admitting: Family Medicine

## 2021-05-11 DIAGNOSIS — E782 Mixed hyperlipidemia: Secondary | ICD-10-CM | POA: Diagnosis not present

## 2021-05-11 DIAGNOSIS — I10 Essential (primary) hypertension: Secondary | ICD-10-CM | POA: Diagnosis not present

## 2021-05-11 DIAGNOSIS — I7789 Other specified disorders of arteries and arterioles: Secondary | ICD-10-CM | POA: Diagnosis not present

## 2021-05-25 DIAGNOSIS — J449 Chronic obstructive pulmonary disease, unspecified: Secondary | ICD-10-CM | POA: Diagnosis not present

## 2021-05-25 DIAGNOSIS — I509 Heart failure, unspecified: Secondary | ICD-10-CM | POA: Diagnosis not present

## 2021-06-12 ENCOUNTER — Ambulatory Visit (INDEPENDENT_AMBULATORY_CARE_PROVIDER_SITE_OTHER): Payer: Medicare HMO

## 2021-06-12 ENCOUNTER — Telehealth: Payer: Self-pay | Admitting: *Deleted

## 2021-06-12 ENCOUNTER — Other Ambulatory Visit: Payer: Self-pay

## 2021-06-12 ENCOUNTER — Ambulatory Visit
Admission: EM | Admit: 2021-06-12 | Discharge: 2021-06-12 | Disposition: A | Discharge status: Home / Self Care | Payer: Medicare HMO | Attending: Emergency Medicine | Admitting: Emergency Medicine

## 2021-06-12 ENCOUNTER — Encounter: Payer: Self-pay | Admitting: Emergency Medicine

## 2021-06-12 DIAGNOSIS — F5101 Primary insomnia: Secondary | ICD-10-CM | POA: Diagnosis not present

## 2021-06-12 DIAGNOSIS — J189 Pneumonia, unspecified organism: Secondary | ICD-10-CM

## 2021-06-12 DIAGNOSIS — S61411A Laceration without foreign body of right hand, initial encounter: Secondary | ICD-10-CM

## 2021-06-12 DIAGNOSIS — W19XXXA Unspecified fall, initial encounter: Secondary | ICD-10-CM | POA: Diagnosis not present

## 2021-06-12 DIAGNOSIS — R0781 Pleurodynia: Secondary | ICD-10-CM

## 2021-06-12 DIAGNOSIS — F321 Major depressive disorder, single episode, moderate: Secondary | ICD-10-CM | POA: Diagnosis not present

## 2021-06-12 DIAGNOSIS — R52 Pain, unspecified: Secondary | ICD-10-CM | POA: Diagnosis not present

## 2021-06-12 DIAGNOSIS — M79641 Pain in right hand: Secondary | ICD-10-CM

## 2021-06-12 DIAGNOSIS — R918 Other nonspecific abnormal finding of lung field: Secondary | ICD-10-CM | POA: Diagnosis not present

## 2021-06-12 DIAGNOSIS — R69 Illness, unspecified: Secondary | ICD-10-CM | POA: Diagnosis not present

## 2021-06-12 MED ORDER — DOXYCYCLINE HYCLATE 100 MG PO CAPS: 100.0000 mg | ORAL_CAPSULE | Freq: Two times a day (BID) | ORAL | 0 refills | Status: AC

## 2021-06-12 MED ORDER — IBUPROFEN 600 MG PO TABS: 600.0000 mg | ORAL_TABLET | Freq: Four times a day (QID) | ORAL | 0 refills | Status: DC | PRN

## 2021-06-12 MED ORDER — TIZANIDINE HCL 4 MG PO TABS: 4.0000 mg | ORAL_TABLET | Freq: Three times a day (TID) | ORAL | 0 refills | Status: DC | PRN

## 2021-06-12 MED ORDER — AMOXICILLIN-POT CLAVULANATE 875-125 MG PO TABS: 1.0000 | ORAL_TABLET | Freq: Two times a day (BID) | ORAL | 0 refills | Status: AC

## 2021-06-12 NOTE — ED Triage Notes (Signed)
Patient states that she fell out of the bed Saturday morning.  Now she is having low back pain more on the right side.  Patient did injure her right arm.  Unsure if she hit or head or not.  Patient has taken Excedrin and Advil w/o relief.

## 2021-06-12 NOTE — Discharge Instructions (Addendum)
Take 600 mg of ibuprofen combined with 1000 mg of Tylenol together 3-4 times a day as needed for pain.  Zanaflex well with muscle spasms.  Finish the antibiotics, even if you feel better.  You have a suspicious early pneumonia in your right upper lobe.  With your doctor in 3 days.  May apply bacitracin to your skin tears and keep it covered during the day.  Give it as much dry time as possible.

## 2021-06-12 NOTE — Telephone Encounter (Signed)
PLEASE NOTE: All timestamps contained within this report are represented as [place] Standard Time. CONFIDENTIALTY NOTICE: This fax transmission is intended only for the addressee. It contains information that is legally privileged, confidential or otherwise protected from use or disclosure. If you are not the intended recipient, you are strictly prohibited from reviewing, disclosing, copying using or disseminating any of this information or taking any action in reliance on or regarding this information. If you have received this fax in error, please notify us immediately by telephone so that we can arrange for its return to [place]. Phone: [phone], Toll-Free: [phone], Fax: [phone] Page: 1 of 2 Call Id: [id number] [facility] - Client TELEPHONE ADVICE RECORD [facility] Patient Name: Morgan [name] ID Gender: Female DOB: 1951/08/08 Age: 70 Y 11 M 22 D Return Phone Number: [phone] (Primary) Address: City/ State/ Zip: [place] [address] Client [facility] - Client Client Site [place] - Day Physician [name]- MD Contact Type Call Who Is Calling Patient / Member / Family / Caregiver Call Type Triage / Clinical Relationship To Patient Self Return Phone Number [phone] (Primary) Chief Complaint Back Injury Reason for Call Symptomatic / Request for [place] states she fell out of bed on Saturday and has extreme pain in back. No appts til Wednesday. Additional Comment The hand is torn up. Translation No Nurse Assessment Nurse: Jimmye Norman, RN, [place] Date/Time (Eastern Time): 06/12/2021 9:16:36 AM Confirm and document reason for call. If symptomatic, describe symptoms. ---Caller states she fell out of bed on Saturday and has extreme pain in back. Does the patient have any new or worsening symptoms? ---Yes Will a triage be completed? ---Yes Related  visit to physician within the last 2 weeks? ---No Does the PT have any chronic conditions? (i.e. diabetes, asthma, this includes High risk factors for pregnancy, etc.) ---Yes List chronic conditions. ---COPD, heart disease Is this a behavioral health or substance abuse call? ---No Guidelines Guideline Title Affirmed Question Affirmed Notes Nurse Date/Time (Eastern Time) Back Injury [1] SEVERE pain (e.g., excruciating) AND [2] not improved 2 hours after pain medicine/ ice packs Jimmye Norman, RN, Whitney 06/12/2021 9:17:29 AM Disp. Time Eilene Ghazi Time) Disposition Final User PLEASE NOTE: All timestamps contained within this report are represented as [place] Standard Time. CONFIDENTIALTY NOTICE: This fax transmission is intended only for the addressee. It contains information that is legally privileged, confidential or otherwise protected from use or disclosure. If you are not the intended recipient, you are strictly prohibited from reviewing, disclosing, copying using or disseminating any of this information or taking any action in reliance on or regarding this information. If you have received this fax in error, please notify us immediately by telephone so that we can arrange for its return to [place]. Phone: [phone], Toll-Free: [phone], Fax: [phone] Page: 2 of 2 Call Id: [id number] 06/12/2021 9:19:59 AM See HCP within 4 Hours (or PCP triage) Yes Jimmye Norman, RN, Loree Fee Caller Disagree/Comply Comply Caller Understands Yes PreDisposition Call Doctor Care Advice Given Per Guideline SEE HCP (OR PCP TRIAGE) WITHIN 4 HOURS: * IF OFFICE WILL BE OPEN: You need to be seen within the next 3 or 4 hours. Call your doctor (or NP/PA) now or as soon as the office opens. Comments User: Myriam Forehand, RN Date/Time (Eastern Time): 06/12/2021 9:20:46 AM RN advised caller she should go to UC/ED for evaluation within that time frame as there are no appointments until Wednesday in the office.  Caller verbalized  understanding. Referrals GO TO FACILITY UNDECIDED

## 2021-06-12 NOTE — ED Provider Notes (Signed)
HPI  SUBJECTIVE:  Morgan [name] is a left-handed 70 y.o. female who presents with laceration, ecchymosis to her right hand and arm, arm pain, and bilateral back pain for 2 days.  States that she was asleep, caught her arm between the bed frame and the mattress and slid off the bed, twisting her arm and back.  She denies hitting her head, loss of consciousness, amnesia, headache.  She states the back pain is sore, constant, located in bilateral lower rib cage, worse on the right.  She has had a nonproductive cough for the past several days.  No wheezing, chest pain, shortness of breath, fevers, body aches, low back pain.  She has tried Excedrin and Advil for this without improvement in her symptoms.  Symptoms are worse with movement, getting up, deep inspiration.  States that she cannot bend over secondary to the pain.  She also reports 2 skin tears and ecchymosis over her right hand/wrist.  She reports pain, states that it itches.  She is able to move her fingers well.  No numbness or tingling, no swelling, purulent drainage.  She is washed out with water and soap, has been applying triple antibiotic ointment and a dressing on top of it.  There are no aggravating or alleviating factors.  She has a past medical history of CVA, she is not on any antiplatelets or anticoagulants, congestive heart failure, COPD, hypertension.  She quit smoking 7 years ago.  No history of diabetes, chronic kidney disease.  Her tetanus is up-to-date.  GUY:QIHK, Jobe Marker, MD     Past Medical History:  Diagnosis Date   Allergy    Anemia    Anxiety    Arthritis    CHF (congestive heart failure) (HCC)    Depression    GERD (gastroesophageal reflux disease)    Hypertension    MVP (mitral valve prolapse)     Past Surgical History:  Procedure Laterality Date   ABDOMINAL HYSTERECTOMY     APPENDECTOMY     BALLOON DILATION N/A 04/21/2020   Procedure: BALLOON DILATION;  Surgeon: Milus Banister, MD;  Location: [facility];   Service: Endoscopy;  Laterality: N/A;  pyloric/ GI   BALLOON DILATION N/A 07/04/2020   Procedure: BALLOON DILATION;  Surgeon: Mauri Pole, MD;  Location: [facility];  Service: Endoscopy;  Laterality: N/A;   BIOPSY  04/21/2020   Procedure: BIOPSY;  Surgeon: Milus Banister, MD;  Location: [facility];  Service: Endoscopy;;   BIOPSY  07/04/2020   Procedure: BIOPSY;  Surgeon: Mauri Pole, MD;  Location: [facility];  Service: Endoscopy;;  EGD and COLON   BREAST SURGERY Right 1988   tumor removal   CHOLECYSTECTOMY N/A 05/18/2020   Procedure: LAPAROSCOPIC CHOLECYSTECTOMY;  Surgeon: Johnathan Hausen, MD;  Location: [facility];  Service: General;  Laterality: N/A;   COLONOSCOPY WITH PROPOFOL N/A 07/04/2020   Procedure: COLONOSCOPY WITH PROPOFOL;  Surgeon: Mauri Pole, MD;  Location: [facility];  Service: Endoscopy;  Laterality: N/A;   ESOPHAGEAL DILATION  08/11/2020   Procedure: PYLORIC DILATION;  Surgeon: Doran Stabler, MD;  Location: [facility];  Service: Gastroenterology;;   ESOPHAGOGASTRODUODENOSCOPY (EGD) WITH PROPOFOL N/A 04/21/2020   Procedure: ESOPHAGOGASTRODUODENOSCOPY (EGD) WITH PROPOFOL;  Surgeon: Milus Banister, MD;  [facility];  Service: Endoscopy;  Laterality: N/A;   ESOPHAGOGASTRODUODENOSCOPY (EGD) WITH PROPOFOL N/A 07/04/2020   Procedure: ESOPHAGOGASTRODUODENOSCOPY (EGD) WITH PROPOFOL;  Surgeon: Mauri Pole, MD;  [facility];  Service: Endoscopy;  Laterality: N/A;  ESOPHAGOGASTRODUODENOSCOPY (EGD) WITH PROPOFOL N/A 08/11/2020   Procedure: ESOPHAGOGASTRODUODENOSCOPY (EGD) WITH PROPOFOL;  Surgeon: Doran Stabler, MD;  [facility];  Service: Gastroenterology;  Laterality: N/A;   KNEE SURGERY  2005   2 times left   KNEE SURGERY Right    POLYPECTOMY  07/04/2020   Procedure: POLYPECTOMY;  Surgeon: Mauri Pole, MD;  [facility];  Service: Endoscopy;;   TUBAL LIGATION      Family History  Problem  Relation Age of Onset   Breast cancer Mother 74   Ovarian cancer Mother 75   Brain cancer Mother    Hypertension Father    Hyperlipidemia Father    Heart disease Father    Colon cancer Father        diagnosed in his 21s   Breast cancer Maternal Grandmother 72   Diabetes Paternal Grandmother    Breast cancer Paternal Grandmother 13   Other Son        drug over dose   Colon cancer Maternal Grandfather        dx in his 22s   Liver disease Maternal Grandfather    Breast cancer Maternal Aunt 60   Breast cancer Maternal Aunt 60   Colon cancer Maternal Uncle    Stomach cancer Neg Hx    Pancreatic cancer Neg Hx    Esophageal cancer Neg Hx     Social History   Tobacco Use   Smoking status: Former    Packs/day: 1.00    Years: 15.00    Pack years: 15.00    Types: Cigarettes    Quit date: 09/12/2017    Years since quitting: 3.7   Smokeless tobacco: Never  Vaping Use   Vaping Use: Never used  Substance Use Topics   Alcohol use: Not Currently   Drug use: No    No current facility-administered medications for this encounter.  Current Outpatient Medications:    albuterol (VENTOLIN HFA) 108 (90 Base) MCG/ACT inhaler, Inhale 1-2 puffs into the lungs every 6 (six) hours as needed for wheezing or shortness of breath., Disp: 18 g, Rfl: 0   amoxicillin-clavulanate (AUGMENTIN) 875-125 MG tablet, Take 1 tablet by mouth 2 (two) times daily for 5 days., Disp: 10 tablet, Rfl: 0   aspirin-acetaminophen-caffeine (EXCEDRIN MIGRAINE) 250-250-65 MG tablet, Take 1 tablet by mouth every 6 (six) hours as needed for headache or migraine., Disp: , Rfl:    doxycycline (VIBRAMYCIN) 100 MG capsule, Take 1 capsule (100 mg total) by mouth 2 (two) times daily for 5 days., Disp: 10 capsule, Rfl: 0   FLUoxetine (PROZAC) 40 MG capsule, Take 40 mg by mouth daily., Disp: , Rfl:    furosemide (LASIX) 40 MG tablet, Take 1 tablet (40 mg total) by mouth daily as needed for fluid or edema. (Patient taking differently:  Take 40 mg by mouth in the morning.), Disp: 30 tablet, Rfl:    gabapentin (NEURONTIN) 400 MG capsule, Take 400 mg by mouth 3 (three) times daily as needed (for nerve pain)., Disp: , Rfl:    ibuprofen (ADVIL) 600 MG tablet, Take 1 tablet (600 mg total) by mouth every 6 (six) hours as needed., Disp: 30 tablet, Rfl: 0   mirtazapine (REMERON) 15 MG tablet, Take 15 mg by mouth at bedtime., Disp: , Rfl:    OXYGEN, Inhale 2 L/min into the lungs See admin instructions. 2 L/min at bedtime and as needed for shortness of breath throughout the day, Disp: , Rfl:    sucralfate (CARAFATE) 1  g tablet, TAKE 1 TABLET (1 G TOTAL) BY MOUTH 4 (FOUR) TIMES DAILY - WITH MEALS AND AT BEDTIME., Disp: 360 tablet, Rfl: 1   tiZANidine (ZANAFLEX) 4 MG tablet, Take 1 tablet (4 mg total) by mouth every 8 (eight) hours as needed for muscle spasms., Disp: 30 tablet, Rfl: 0   ARIPiprazole (ABILIFY) 2 MG tablet, Take 2 mg by mouth daily., Disp: , Rfl:    atorvastatin (LIPITOR) 40 MG tablet, Take 40 mg by mouth daily. , Disp: , Rfl:    carvedilol (COREG) 3.125 MG tablet, Take 3.125 mg by mouth in the morning and at bedtime., Disp: , Rfl:    dicyclomine (BENTYL) 10 MG capsule, TAKE 1 CAPSULE (10 MG TOTAL) BY MOUTH 4 (FOUR) TIMES DAILY - BEFORE MEALS AND AT BEDTIME., Disp: 120 capsule, Rfl: 1   LINZESS 145 MCG CAPS capsule, TAKE 1 CAPSULE (145 MCG TOTAL) BY MOUTH DAILY BEFORE BREAKFAST. (Patient taking differently: Take 145 mcg by mouth daily as needed (for constipation, or as otherwise instructed).), Disp: 30 capsule, Rfl: 3   omeprazole (PRILOSEC) 40 MG capsule, Take 1 capsule (40 mg total) by mouth daily. (Patient taking differently: Take 40 mg by mouth daily before breakfast.), Disp: 90 capsule, Rfl: 3   ondansetron (ZOFRAN-ODT) 4 MG disintegrating tablet, Take 1-2 tablets by mouth every 6 hours as needed, Disp: 360 tablet, Rfl: 3   oxyCODONE (ROXICODONE) 5 MG immediate release tablet, Take 1 tablet (5 mg total) by mouth every 6 (six)  hours as needed., Disp: 10 tablet, Rfl: 0   Potassium Chloride ER 20 MEQ TBCR, Take 20 mEq by mouth daily., Disp: 5 tablet, Rfl: 0   psyllium (METAMUCIL SMOOTH TEXTURE) 58.6 % powder, Take 1 packet by mouth 3 (three) times daily., Disp: 283 g, Rfl: 12   telmisartan (MICARDIS) 40 MG tablet, Take 40 mg by mouth daily., Disp: , Rfl:   Allergies  Allergen Reactions   Meperidine Hives and Anaphylaxis   Meperidine Hcl Anaphylaxis and Hives   Strawberry Extract Hives and Swelling   Carafate [Sucralfate] Nausea Only and Other (See Comments)    Severe nausea   Strawberry (Diagnostic) Hives   Wasp Venom Hives   Wasp Venom Protein Hives     ROS  As noted in HPI.   Physical Exam  BP (!) 165/67 (BP Location: Left Arm)   Pulse 76   SpO2 95%   Constitutional: Well developed, well nourished, no acute distress Eyes:  EOMI, conjunctiva normal bilaterally HENT: Normocephalic, atraumatic,mucus membranes moist Respiratory: Normal inspiratory effort.  Wheezing right lower lobe. Back: Positive T-spine tenderness inferior to bra strap.  Positive diffuse posterior lower rib tenderness along the right ribs.  No bruising, crepitus. Cardiovascular: Normal rate GI: nondistended Skin: 2 skin tears over wrist and hand with surrounding ecchymosis.  No pus.    Musculoskeletal: no deformities. R  distal radius NT , distal ulnar styloid NT , snuffbox tender, carpals tender, metacarpals NT , digits NT, TFCC NT.  Pain with wrist flexion/extension . no pain with radial / ulnar deviation. Motor intact ability to flex / extend digits of affected hand, Sensation LT to hand normal , RP 2+. proximal forearm NT on affected extremity.  Neurologic: Alert & oriented x 3, no focal neuro deficits Psychiatric: Speech and behavior appropriate   ED Course   Medications - No data to display  Orders Placed This Encounter  Procedures   DG Ribs Unilateral W/Chest Right    Standing Status:   Standing  Number of  Occurrences:   1    Order Specific Question:   Reason for Exam (SYMPTOM  OR DIAGNOSIS REQUIRED)    Answer:   fall tenderness mid t spine, lower posterior ribs. wheezing RLL,. h/o COPD. r/o fx, PNA   DG Hand Complete Right    Standing Status:   Standing    Number of Occurrences:   1    Order Specific Question:   Reason for Exam (SYMPTOM  OR DIAGNOSIS REQUIRED)    Answer:   fall, tenderness over MC maximal scaphoid r/o fx    No results found for this or any previous visit (from the past 24 hour(s)). DG Ribs Unilateral W/Chest Right  Result Date: 06/12/2021 CLINICAL DATA:  Pain following fall EXAM: RIGHT RIBS AND CHEST - 3+ VIEW COMPARISON:  February 20, 2021 FINDINGS: Frontal chest as well as oblique and cone-down rib images were obtained. There is ill-defined opacity in the periphery of the right upper lobe. The lungs elsewhere are clear. Heart size and pulmonary vascularity are normal. No evident adenopathy. No appreciable pneumothorax or pleural effusion. No evident rib fracture. IMPRESSION: No rib fracture appreciable. No pneumothorax. Suspected pneumonia periphery of right upper lobe. These results will be called to the ordering clinician or representative by the Radiologist Assistant, and communication documented in the PACS or [employer]. Electronically Signed   By: Lowella Grip III M.D.   On: 06/12/2021 12:58   DG Hand Complete Right  Result Date: 06/12/2021 CLINICAL DATA:  Pain following fall EXAM: RIGHT HAND - COMPLETE 3+ VIEW COMPARISON:  Right wrist radiographs December 20, 2017 FINDINGS: Frontal, oblique, and lateral views obtained. Old healed fracture of the proximal first metacarpal with remodeling noted. No acute fracture or dislocation. Mild spurring in the first IP and second DIP joints. No appreciable joint space narrowing. No erosions. IMPRESSION: No acute fracture or dislocation. Old trauma with remodeling proximal first metacarpal. 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 Electronically Signed   By: Lowella Grip III M.D.   On: 06/12/2021 13:01    ED Clinical Impression  1. Rib pain on right side   2. Community acquired pneumonia of right upper lobe of lung   3. Skin tear of right hand without complication, initial encounter   4. Fall, initial encounter      ED Assessment/Plan  Patient denies headache, loss of consciousness.  She did not hit her head.  1.  Back/posterior thoracic pain with wheezing right lower lobe.  Will x-ray T-spine, rule out pneumothorax, pneumonia, rib fracture.  Rib series, reviewed imaging independently and discussed with radiology.  No fracture.  No pneumothorax.  Suspect pneumonia periphery right upper lobe.  See radiology report for details.   Patient with a ill-defined opacity in the right upper lobe suspicious for an early pneumonia.  While patient is asymptomatic other than a cough for the past few days, she has multiple medical medical comorbidities including COPD, so we will treat empirically for a pneumonia.  Will send home with Augmentin twice daily for 5 days and doxycycline 100 mg twice daily 5 days.  Tylenol/ibuprofen, Zanaflex for the rib pain.   2.  Right wrist/hand pain.  Will x-ray hand to rule out any fracture.  Hand: Reviewed imaging independently.  No acute fracture or dislocation.  See radiology report for details  3.  Skin avulsions.  It has been 2 days.  They are fairly well approximated.  Will let heal by secondary intention.  Occasional bacitracin, keep clean with soap and water.  Dry time.  We will give patient a roll of paper tape as her skin is very thin.  Follow-up with PMD. Discussed MDM, imaging, treatment plan, and plan for follow-up with patient. Discussed sn/sx that should prompt return to the ED. patient agrees with plan.   Meds ordered this encounter  Medications   doxycycline (VIBRAMYCIN) 100 MG capsule    Sig: Take 1 capsule (100 mg total) by mouth 2 (two) times  daily for 5 days.    Dispense:  10 capsule    Refill:  0   amoxicillin-clavulanate (AUGMENTIN) 875-125 MG tablet    Sig: Take 1 tablet by mouth 2 (two) times daily for 5 days.    Dispense:  10 tablet    Refill:  0   ibuprofen (ADVIL) 600 MG tablet    Sig: Take 1 tablet (600 mg total) by mouth every 6 (six) hours as needed.    Dispense:  30 tablet    Refill:  0   tiZANidine (ZANAFLEX) 4 MG tablet    Sig: Take 1 tablet (4 mg total) by mouth every 8 (eight) hours as needed for muscle spasms.    Dispense:  30 tablet    Refill:  0       *This clinic note was created using [employer]. Therefore, there may be occasional mistakes despite careful proofreading.  ?    Melynda Ripple, MD 06/12/21 1500

## 2021-06-12 NOTE — Telephone Encounter (Signed)
Patient is currently at [facility].

## 2021-06-13 NOTE — Telephone Encounter (Signed)
Noted  

## 2021-06-24 DIAGNOSIS — I509 Heart failure, unspecified: Secondary | ICD-10-CM | POA: Diagnosis not present

## 2021-06-24 DIAGNOSIS — J449 Chronic obstructive pulmonary disease, unspecified: Secondary | ICD-10-CM | POA: Diagnosis not present

## 2021-06-26 ENCOUNTER — Other Ambulatory Visit: Payer: Self-pay

## 2021-06-26 ENCOUNTER — Telehealth: Payer: Self-pay

## 2021-06-26 ENCOUNTER — Ambulatory Visit (INDEPENDENT_AMBULATORY_CARE_PROVIDER_SITE_OTHER): Payer: Medicare HMO | Admitting: Family Medicine

## 2021-06-26 ENCOUNTER — Ambulatory Visit (INDEPENDENT_AMBULATORY_CARE_PROVIDER_SITE_OTHER)
Admission: RE | Admit: 2021-06-26 | Discharge: 2021-06-26 | Disposition: A | Discharge status: Home / Self Care | Payer: Medicare HMO | Source: Ambulatory Visit | Attending: Family Medicine | Admitting: Family Medicine

## 2021-06-26 VITALS — BP 150/90 | HR 79 | Temp 97.4°F | Wt 245.0 lb

## 2021-06-26 DIAGNOSIS — S22079D Unspecified fracture of T9-T10 vertebra, subsequent encounter for fracture with routine healing: Secondary | ICD-10-CM

## 2021-06-26 DIAGNOSIS — G8929 Other chronic pain: Secondary | ICD-10-CM | POA: Diagnosis not present

## 2021-06-26 DIAGNOSIS — M546 Pain in thoracic spine: Secondary | ICD-10-CM

## 2021-06-26 DIAGNOSIS — S22070A Wedge compression fracture of T9-T10 vertebra, initial encounter for closed fracture: Secondary | ICD-10-CM | POA: Diagnosis not present

## 2021-06-26 DIAGNOSIS — I10 Essential (primary) hypertension: Secondary | ICD-10-CM

## 2021-06-26 IMAGING — US US EXTREM LOW VENOUS*R*
1 series · 14 of 24 positions shown · non-contrast
Comparison: None.

CLINICAL DATA: Right leg pain.  Pain for 12 days.

EXAM:
RIGHT LOWER EXTREMITY VENOUS DOPPLER ULTRASOUND
TECHNIQUE: Gray-scale sonography with compression, as well as color and duplex
ultrasound, were performed to evaluate the deep venous system(s)
from the level of the common femoral vein through the popliteal and
proximal calf veins.

[Series 1: us venous img lower uni right (dvt) · portal-venous · 14 of 33 slices shown]
[im 1/33]
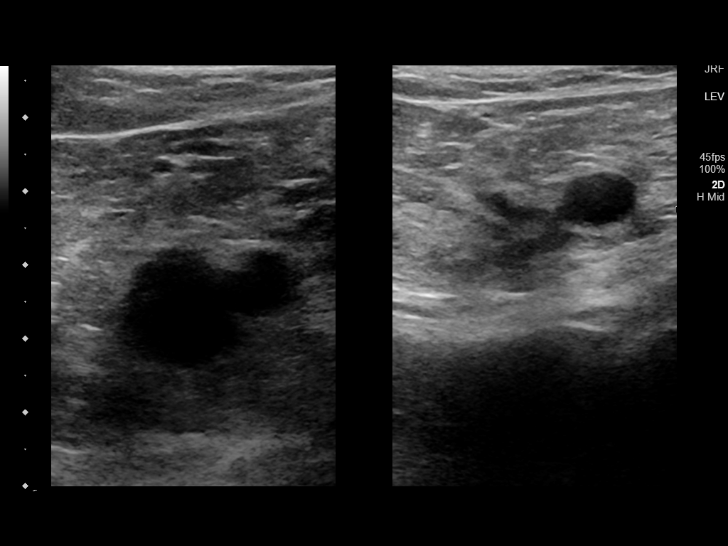
[im 3/33]
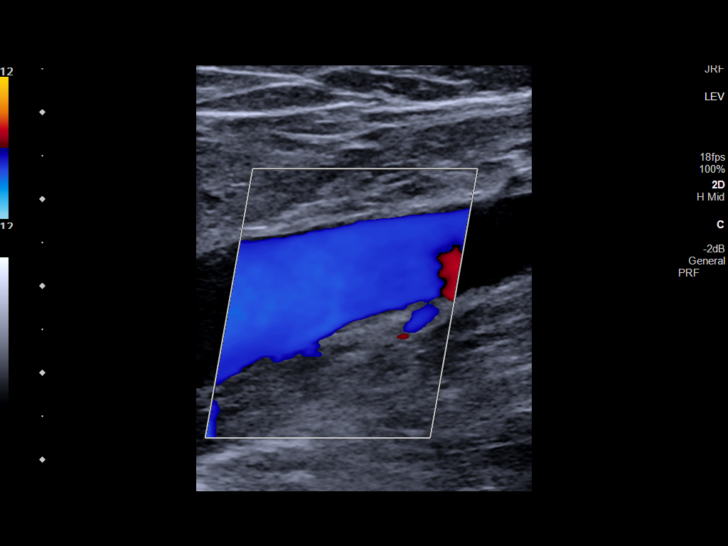
[im 6/33]
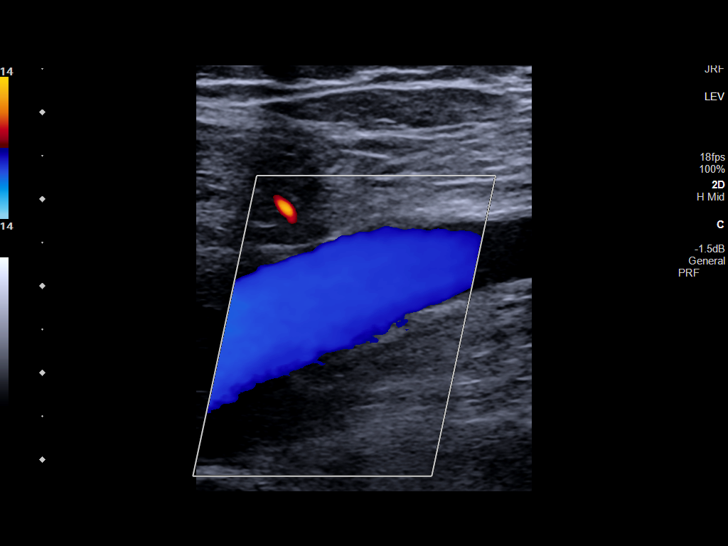
[im 9/33]
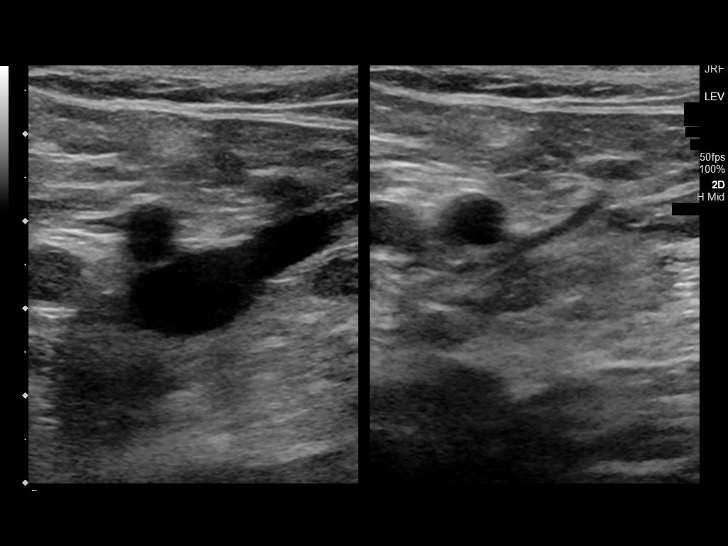
[im 10/33]
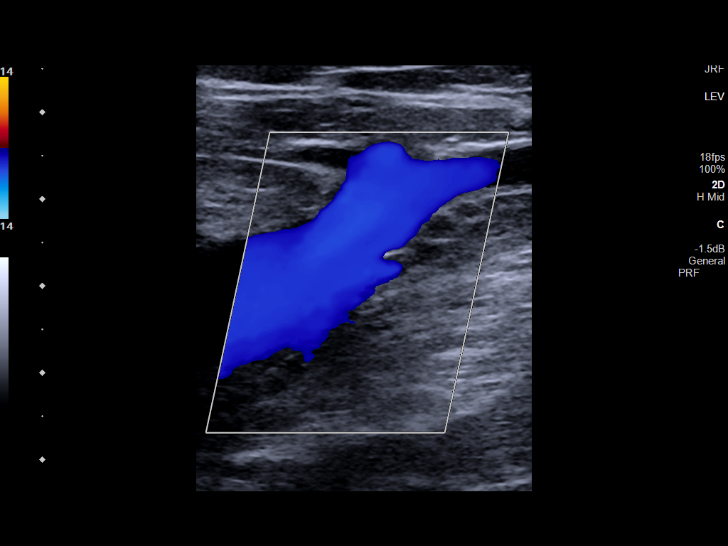
[im 13/33]
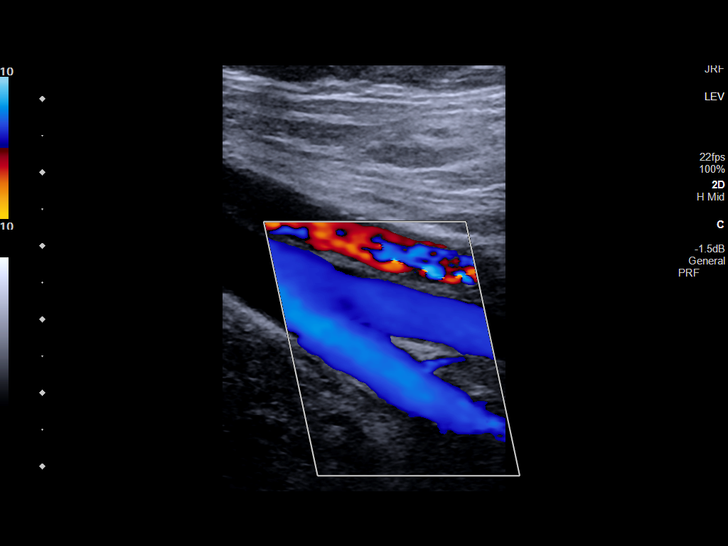
[im 16/33]
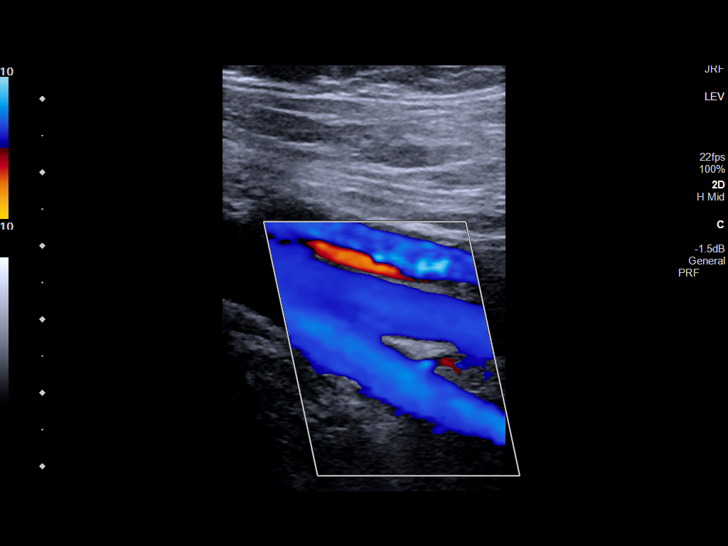
[im 17/33]
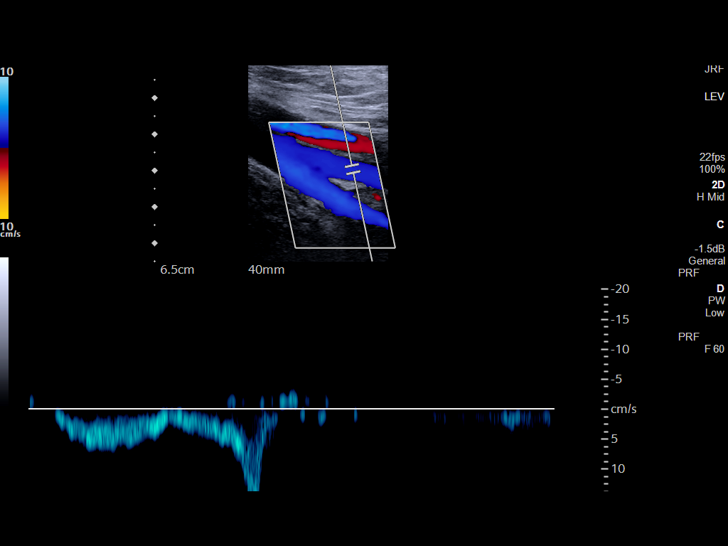
[im 20/33]
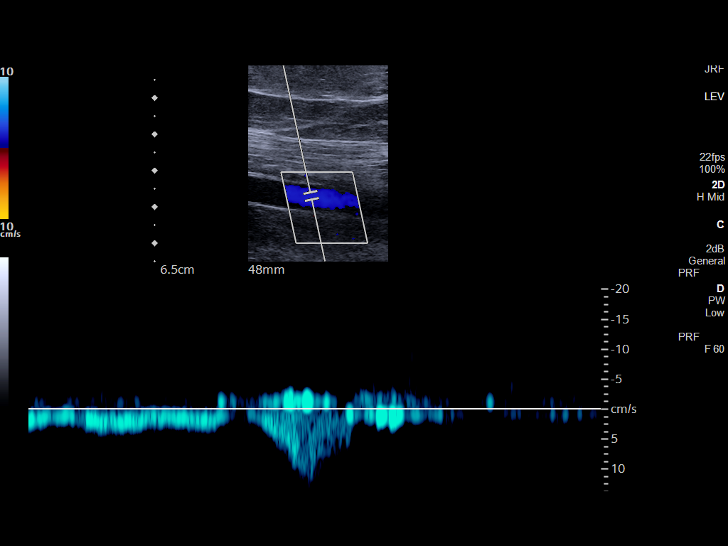
[im 23/33]
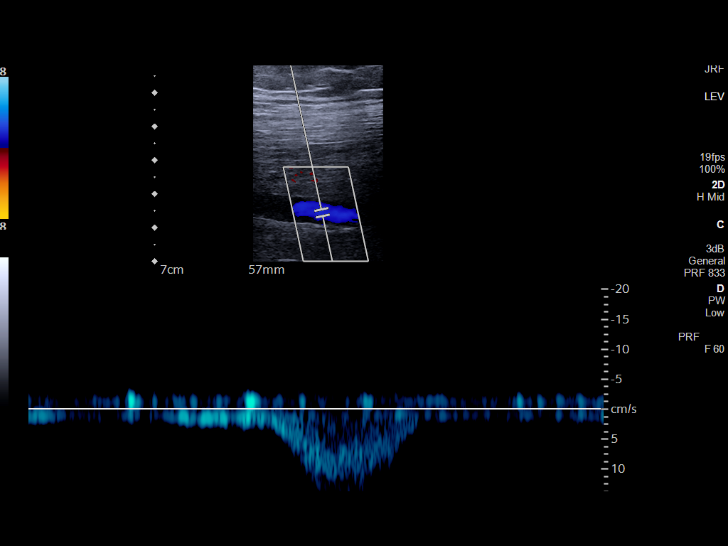
[im 26/33]
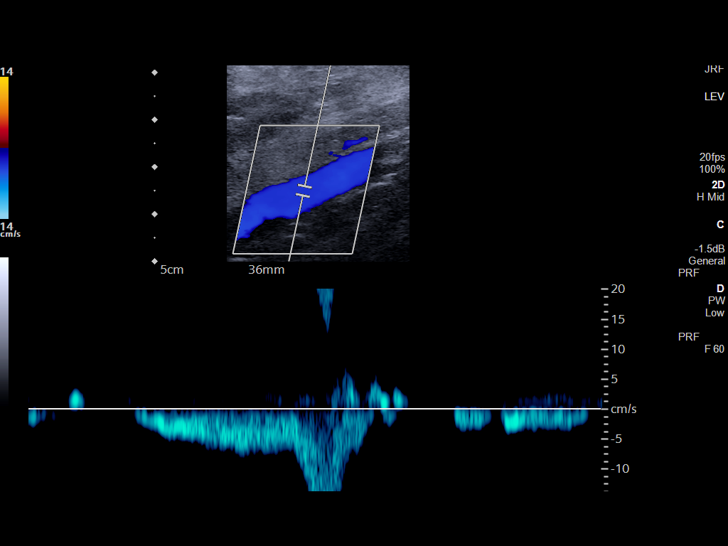
[im 27/33]
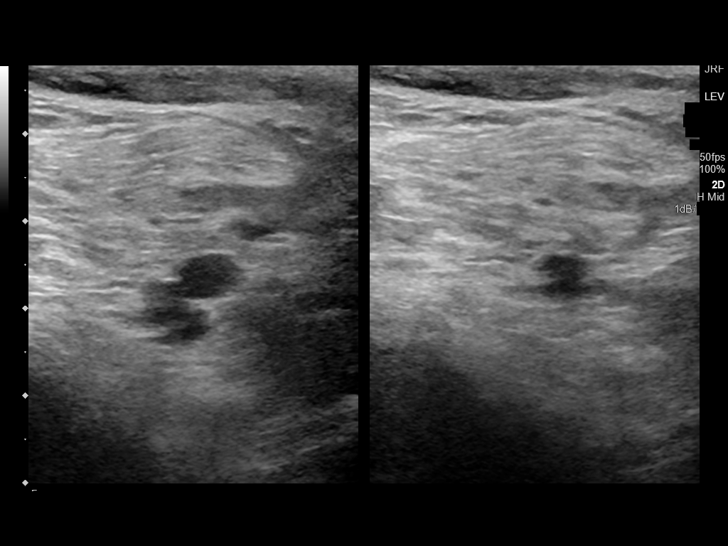
[im 30/33]
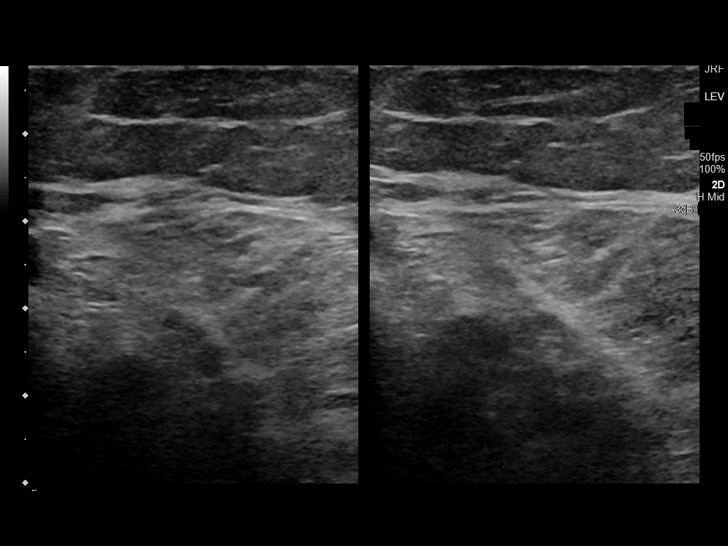
[im 33/33]
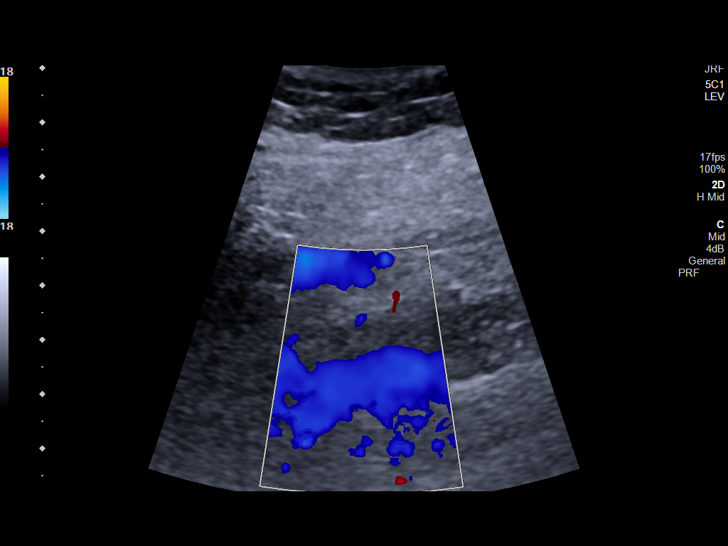

[14 of 24 positions shown; findings below may reference images not displayed]

FINDINGS: VENOUS

Normal compressibility of the common femoral, superficial femoral,
and popliteal veins, as well as the visualized calf veins.
Visualized portions of profunda femoral vein and great saphenous
vein unremarkable. No filling defects to suggest DVT on grayscale or
color Doppler imaging. Doppler waveforms show normal direction of
venous flow, normal respiratory phasicity and response to
augmentation.

Limited views of the contralateral common femoral vein are
unremarkable.

OTHER

None.

Limitations: none
IMPRESSION: No evidence of right lower extremity DVT.

## 2021-06-26 MED ORDER — OXYCODONE HCL 5 MG PO TABS: 5.0000 mg | ORAL_TABLET | Freq: Three times a day (TID) | ORAL | 0 refills | Status: DC | PRN

## 2021-06-26 NOTE — Assessment & Plan Note (Signed)
From fall with point tenderness. XR T-spine reassuring - will follow-up final read. Oxycodone for severe pain. Advised limiting dosage. Will also plan for referral due to recurrent severe pain response.

## 2021-06-26 NOTE — Patient Instructions (Signed)
Back pain - x-ray today - oxycodone 5 mg, #20 - ideally you will only take if severe pain and hopefully this will last for future cases - referral - I am going to speak with pain and rehab doctors and see if someone can help with your complex response to pain

## 2021-06-26 NOTE — Progress Notes (Addendum)
Subjective:     Morgan [name] is a 70 y.o. female presenting for Fall (3 weeks ago) and Back Pain (R sided Thoracic 10/10 pain. Heard something pop yesterday )     Fall  Back Pain   #Fall  - 2.5 weeks ago - evaluated w/ x-ray on 6/27 - pain is across the back like someone is taking sledge hammer - worse in the back and the upper abdominal on both side - pain is worse with movement - laying down - deep breath - improved: nothing - tried: ibuprofen, Excedrin, icy/hot  No help with muscle relaxant  #pneumonia - noted on imaging when she went to urgent  - completed course of abx   Review of Systems  Musculoskeletal:  Positive for back pain.   06/12/2021: Urgent care - fall with rib pain. XR w/ pneumonia and started on augmentin and doxy to cover. Zanaflex and tylenol/ibuprofen. Hand film w/o fracture. Skin tears to heal on their own  Social History   Tobacco Use  Smoking Status Former   Packs/day: 1.00   Years: 15.00   Pack years: 15.00   Types: Cigarettes   Quit date: 09/12/2017   Years since quitting: 3.7  Smokeless Tobacco Never        Objective:    BP Readings from Last 3 Encounters:  06/26/21 (!) 150/90  06/12/21 (!) 165/67  03/26/21 (!) 149/77   Wt Readings from Last 3 Encounters:  06/26/21 245 lb (111.1 kg)  03/26/21 245 lb (111.1 kg)  03/20/21 245 lb (111.1 kg)    BP (!) 150/90   Pulse 79   Temp (!) 97.4 F (36.3 C) (Temporal)   Wt 245 lb (111.1 kg)   SpO2 98%   BMI 32.32 kg/m    Physical Exam Constitutional:      General: She is not in acute distress.    Appearance: She is well-developed. She is not diaphoretic.     Comments: Wincing in pain w/ any movement  HENT:     Right Ear: External ear normal.     Left Ear: External ear normal.  Eyes:     Conjunctiva/sclera: Conjunctivae normal.  Cardiovascular:     Rate and Rhythm: Normal rate and regular rhythm.     Heart sounds: No murmur heard. Pulmonary:     Effort: Pulmonary  effort is normal.     Breath sounds: Wheezing (Left lower lobe) present. No rhonchi or rales.  Abdominal:     General: Abdomen is protuberant. There is no distension.     Palpations: Abdomen is soft.     Tenderness: There is abdominal tenderness (to light palpation, generalized, worse RUQ and LUQ).  Musculoskeletal:     Cervical back: Neck supple.     Comments: Back Inspection: no step off, right hypertrophy, visible pain with laying back, unable to sit up easily  Palpation: Thoracic spine ttp with light palpation, thoracic muscle ROM: normal but with movement pain    Skin:    General: Skin is warm and dry.     Capillary Refill: Capillary refill takes less than 2 seconds.  Neurological:     Mental Status: She is alert. Mental status is at baseline.  Psychiatric:        Mood and Affect: Mood normal.        Behavior: Behavior normal.   T-spine XR: my review - no acute fracture, opacities seen previously appear improved       Assessment & Plan:   Problem  List Items Addressed This Visit       Cardiovascular and Mediastinum   Essential hypertension    Elevated likely 2/2 to pain. Follows with cardiology. Cont coreg and telmisartan          Other   Acute midline thoracic back pain - Primary    From fall with point tenderness. XR T-spine reassuring - will follow-up final read. Oxycodone for severe pain. Advised limiting dosage. Will also plan for referral due to recurrent severe pain response.        Relevant Medications   oxyCODONE (ROXICODONE) 5 MG immediate release tablet   Other Relevant Orders   DG Thoracic Spine 4V   Other chronic pain    Discussed with patient her frequent presentation with severe pain response in many different body parts (stomach, knee, back). Discussed and encouraged assessment for why her pain response seems out of proportion to her injury with persistent symptoms. Will reach out to PMR/pain medicine to see if they can evaluate further and  determine good treatment plan to reduce visits.        Relevant Medications   oxyCODONE (ROXICODONE) 5 MG immediate release tablet   Addendum: heard back from [facility] who will see patient. Referral placed  Return if symptoms worsen or fail to improve.  [name], MD  This visit occurred during the SARS-CoV-2 public health emergency.  Safety protocols were in place, including screening questions prior to the visit, additional usage of staff PPE, and extensive cleaning of exam room while observing appropriate contact time as indicated for disinfecting solutions.

## 2021-06-26 NOTE — Assessment & Plan Note (Signed)
Discussed with patient her frequent presentation with severe pain response in many different body parts (stomach, knee, back). Discussed and encouraged assessment for why her pain response seems out of proportion to her injury with persistent symptoms. Will reach out to PMR/pain medicine to see if they can evaluate further and determine good treatment plan to reduce visits.

## 2021-06-26 NOTE — Addendum Note (Signed)
Addended by: [name] R on: 06/26/2021 02:02 PM   Modules accepted: Orders

## 2021-06-26 NOTE — Assessment & Plan Note (Signed)
Elevated likely 2/2 to pain. Follows with cardiology. Cont coreg and telmisartan

## 2021-06-26 NOTE — Telephone Encounter (Signed)
[place] Night - Client Nonclinical Telephone Record  [facility] Client [facility] Night - Client Client Site [place] - Night Contact Type Call Who Is Calling Patient / Member / Family / Caregiver Caller Name [place] Phone Number [phone] Patient Name Morgan [name] Patient DOB 10/02/1951 Call Type Message Only Information Provided Reason for Call Request to Schedule Office Appointment Initial Comment Caller states she injured her back and is needing an appointment. hours information provided Patient request to speak to RN No Disp. Time Disposition Final User 06/26/2021 8:24:49 AM General Information Provided Yes Domingo Dimes, [place] Call Closed By: Memory Argue Transaction Date/Time: 06/26/2021 8:23:12 AM (ET)

## 2021-06-26 NOTE — Telephone Encounter (Signed)
Pt was seen in the office today by Dr. Einar Pheasant,.

## 2021-06-28 NOTE — Telephone Encounter (Signed)
Patient left a voicemail stating that she needs a referral and wants a call back to discuss this.

## 2021-06-28 NOTE — Telephone Encounter (Signed)
I spoke with patient this morning and discussed option of seeing Ortho for the back.   Referral placed.   I've also placed a referral to PMR for her recurrent severe pain.

## 2021-06-28 NOTE — Addendum Note (Signed)
Addended by: [name] on: 06/28/2021 04:32 PM   Modules accepted: Orders

## 2021-06-28 NOTE — Telephone Encounter (Signed)
Patient call in requesting info about referral for her back pain .would like a call back

## 2021-06-29 NOTE — Telephone Encounter (Addendum)
Pt left VM requesting call back regarding referral placed yesterday, will route to referral pool

## 2021-06-29 NOTE — Telephone Encounter (Signed)
Her referral was sent to Ortho Dr Roland Rack as requested. They will call her with an appt or she can call them for an appt.   Everything was sent over today by [facility]

## 2021-07-01 ENCOUNTER — Other Ambulatory Visit: Payer: Self-pay

## 2021-07-01 ENCOUNTER — Encounter (HOSPITAL_COMMUNITY): Payer: Self-pay

## 2021-07-01 ENCOUNTER — Emergency Department (HOSPITAL_COMMUNITY): Payer: Medicare HMO

## 2021-07-01 ENCOUNTER — Emergency Department (HOSPITAL_COMMUNITY)
Admission: EM | Admit: 2021-07-01 | Discharge: 2021-07-01 | Disposition: A | Discharge status: Home / Self Care | Payer: Medicare HMO | Attending: Emergency Medicine | Admitting: Emergency Medicine

## 2021-07-01 DIAGNOSIS — I10 Essential (primary) hypertension: Secondary | ICD-10-CM | POA: Diagnosis not present

## 2021-07-01 DIAGNOSIS — M545 Low back pain, unspecified: Secondary | ICD-10-CM | POA: Insufficient documentation

## 2021-07-01 DIAGNOSIS — M79605 Pain in left leg: Secondary | ICD-10-CM | POA: Diagnosis not present

## 2021-07-01 DIAGNOSIS — Z043 Encounter for examination and observation following other accident: Secondary | ICD-10-CM | POA: Diagnosis not present

## 2021-07-01 DIAGNOSIS — I130 Hypertensive heart and chronic kidney disease with heart failure and stage 1 through stage 4 chronic kidney disease, or unspecified chronic kidney disease: Secondary | ICD-10-CM | POA: Insufficient documentation

## 2021-07-01 DIAGNOSIS — K219 Gastro-esophageal reflux disease without esophagitis: Secondary | ICD-10-CM

## 2021-07-01 DIAGNOSIS — M25572 Pain in left ankle and joints of left foot: Secondary | ICD-10-CM | POA: Diagnosis not present

## 2021-07-01 DIAGNOSIS — D509 Iron deficiency anemia, unspecified: Secondary | ICD-10-CM

## 2021-07-01 DIAGNOSIS — Z743 Need for continuous supervision: Secondary | ICD-10-CM | POA: Diagnosis not present

## 2021-07-01 DIAGNOSIS — R519 Headache, unspecified: Secondary | ICD-10-CM | POA: Insufficient documentation

## 2021-07-01 DIAGNOSIS — M546 Pain in thoracic spine: Secondary | ICD-10-CM | POA: Diagnosis not present

## 2021-07-01 DIAGNOSIS — R609 Edema, unspecified: Secondary | ICD-10-CM | POA: Diagnosis not present

## 2021-07-01 DIAGNOSIS — S8012XA Contusion of left lower leg, initial encounter: Secondary | ICD-10-CM

## 2021-07-01 DIAGNOSIS — R110 Nausea: Secondary | ICD-10-CM

## 2021-07-01 DIAGNOSIS — J449 Chronic obstructive pulmonary disease, unspecified: Secondary | ICD-10-CM | POA: Diagnosis not present

## 2021-07-01 DIAGNOSIS — Y9301 Activity, walking, marching and hiking: Secondary | ICD-10-CM | POA: Insufficient documentation

## 2021-07-01 DIAGNOSIS — W010XXA Fall on same level from slipping, tripping and stumbling without subsequent striking against object, initial encounter: Secondary | ICD-10-CM | POA: Insufficient documentation

## 2021-07-01 DIAGNOSIS — M7989 Other specified soft tissue disorders: Secondary | ICD-10-CM | POA: Diagnosis not present

## 2021-07-01 DIAGNOSIS — K5904 Chronic idiopathic constipation: Secondary | ICD-10-CM

## 2021-07-01 DIAGNOSIS — S22070A Wedge compression fracture of T9-T10 vertebra, initial encounter for closed fracture: Secondary | ICD-10-CM | POA: Insufficient documentation

## 2021-07-01 DIAGNOSIS — Z7982 Long term (current) use of aspirin: Secondary | ICD-10-CM | POA: Diagnosis not present

## 2021-07-01 DIAGNOSIS — S22000A Wedge compression fracture of unspecified thoracic vertebra, initial encounter for closed fracture: Secondary | ICD-10-CM

## 2021-07-01 DIAGNOSIS — M542 Cervicalgia: Secondary | ICD-10-CM | POA: Insufficient documentation

## 2021-07-01 DIAGNOSIS — Z79899 Other long term (current) drug therapy: Secondary | ICD-10-CM | POA: Insufficient documentation

## 2021-07-01 DIAGNOSIS — S0990XA Unspecified injury of head, initial encounter: Secondary | ICD-10-CM | POA: Diagnosis not present

## 2021-07-01 DIAGNOSIS — R1011 Right upper quadrant pain: Secondary | ICD-10-CM

## 2021-07-01 DIAGNOSIS — N183 Chronic kidney disease, stage 3 unspecified: Secondary | ICD-10-CM | POA: Insufficient documentation

## 2021-07-01 DIAGNOSIS — Z87891 Personal history of nicotine dependence: Secondary | ICD-10-CM | POA: Diagnosis not present

## 2021-07-01 DIAGNOSIS — I5032 Chronic diastolic (congestive) heart failure: Secondary | ICD-10-CM | POA: Insufficient documentation

## 2021-07-01 DIAGNOSIS — Y92480 Sidewalk as the place of occurrence of the external cause: Secondary | ICD-10-CM | POA: Diagnosis not present

## 2021-07-01 DIAGNOSIS — S0003XA Contusion of scalp, initial encounter: Secondary | ICD-10-CM | POA: Diagnosis not present

## 2021-07-01 DIAGNOSIS — R1310 Dysphagia, unspecified: Secondary | ICD-10-CM

## 2021-07-01 DIAGNOSIS — R202 Paresthesia of skin: Secondary | ICD-10-CM | POA: Diagnosis not present

## 2021-07-01 DIAGNOSIS — W19XXXA Unspecified fall, initial encounter: Secondary | ICD-10-CM

## 2021-07-01 MED ORDER — ACETAMINOPHEN 500 MG PO TABS
1000.0000 mg | ORAL_TABLET | Freq: Once | ORAL | Status: AC
  Administered 2021-07-01: 1000 mg via ORAL
  Filled 2021-07-01: qty 2

## 2021-07-01 MED ORDER — HYDROMORPHONE HCL 1 MG/ML IJ SOLN
1.0000 mg | Freq: Once | INTRAMUSCULAR | Status: AC
  Administered 2021-07-01: 1 mg via INTRAMUSCULAR
  Filled 2021-07-01: qty 1

## 2021-07-01 MED ORDER — METHOCARBAMOL 500 MG PO TABS
750.0000 mg | ORAL_TABLET | Freq: Once | ORAL | Status: AC
  Administered 2021-07-01: 750 mg via ORAL
  Filled 2021-07-01: qty 2

## 2021-07-01 NOTE — ED Provider Notes (Signed)
[facility] EMERGENCY DEPARTMENT Provider Note   CSN: [id number] Arrival date & time: 07/01/21  1503     History Chief Complaint  Patient presents with   Morgan [name] is a 70 y.o. female.  Patient s/p fall. States tripped on parking cement block, hit head, ?momentary loc. Post fall, dull headache, mild-mod, c/o pain to neck, back, and left ankle. Pain constant, dull, moderate-severe, worse w palpation. Notes other recent fall w t10 compression fx. Denies numbness/weakness. No nausea/vomiting. No chest pain or sob. No abd pain. No faintness or dizziness prior to fall. No anticoag use.   The history is provided by the patient and the EMS personnel.  Fall Pertinent negatives include no chest pain, no abdominal pain and no shortness of breath.      Past Medical History:  Diagnosis Date   Allergy    Anemia    Anxiety    Arthritis    CHF (congestive heart failure) (HCC)    Depression    GERD (gastroesophageal reflux disease)    Hypertension    MVP (mitral valve prolapse)     Patient Active Problem List   Diagnosis Date Noted   Acute midline thoracic back pain 06/26/2021   Other chronic pain 06/26/2021   Chest pressure 02/20/2021   Left sided abdominal pain 02/01/2021   Chronic diastolic CHF (congestive heart failure) ([place]) 08/09/2020   COPD (chronic obstructive pulmonary disease) ([place]) 08/09/2020   Chronic respiratory failure with hypoxia ([place]) 08/09/2020   Polyp of sigmoid colon    Polyp of cecum    Gastritis and gastroduodenitis    Pylorus ulcer    Pyloric stenosis in adult    Chronic cholecystitis 05/19/2020   Oxygen dependent 03/30/2020   Nausea and vomiting in adult 03/22/2020   Peptic ulcer disease 03/22/2020   Iron deficiency anemia 03/22/2020   Acute pain of right knee 12/29/2019   Acute left-sided low back pain without sciatica 12/29/2019   Fall 04/08/2019   Chronic abdominal pain 02/19/2019   Asymptomatic bacteriuria  02/19/2019   CKD (chronic kidney disease) stage 3, GFR 30-59 ml/min ([place]) 01/13/2019   CHF (congestive heart failure) ([place]) 01/13/2019   Dizziness 01/13/2019   Hoarseness 11/12/2018   Dysphagia 11/12/2018   Sleepiness 11/12/2018   CVA (cerebral vascular accident) ([place]) 08/22/2018   Left-sided weakness 10/04/2017   HLD (hyperlipidemia) 10/04/2017   GERD (gastroesophageal reflux disease) 10/04/2017   Anxiety 08/26/2017   Essential hypertension    Depression    Diarrhea 02/22/2015   Hypokalemia 02/22/2015   Chronic lower back pain 04/29/2013   Paresthesia 04/29/2013    Past Surgical History:  Procedure Laterality Date   ABDOMINAL HYSTERECTOMY     APPENDECTOMY     BALLOON DILATION N/A 04/21/2020   Procedure: BALLOON DILATION;  Surgeon: Milus Banister, MD;  Location: [facility] ENDOSCOPY;  Service: Endoscopy;  Laterality: N/A;  pyloric/ GI   BALLOON DILATION N/A 07/04/2020   Procedure: BALLOON DILATION;  Surgeon: Mauri Pole, MD;  Location: [facility];  Service: Endoscopy;  Laterality: N/A;   BIOPSY  04/21/2020   Procedure: BIOPSY;  Surgeon: Milus Banister, MD;  Location: [facility];  Service: Endoscopy;;   BIOPSY  07/04/2020   Procedure: BIOPSY;  Surgeon: Mauri Pole, MD;  Location: [facility];  Service: Endoscopy;;  EGD and COLON   BREAST SURGERY Right 1988   tumor removal   CHOLECYSTECTOMY N/A 05/18/2020   Procedure: LAPAROSCOPIC CHOLECYSTECTOMY;  Surgeon: Johnathan Hausen, MD;  Location: [facility];  Service: General;  Laterality: N/A;   COLONOSCOPY WITH PROPOFOL N/A 07/04/2020   Procedure: COLONOSCOPY WITH PROPOFOL;  Surgeon: Mauri Pole, MD;  Location: [facility];  Service: Endoscopy;  Laterality: N/A;   ESOPHAGEAL DILATION  08/11/2020   Procedure: PYLORIC DILATION;  Surgeon: Doran Stabler, MD;  Location: [facility];  Service: Gastroenterology;;   ESOPHAGOGASTRODUODENOSCOPY (EGD) WITH PROPOFOL N/A 04/21/2020   Procedure: ESOPHAGOGASTRODUODENOSCOPY (EGD)  WITH PROPOFOL;  Surgeon: Milus Banister, MD;  Location: [facility];  Service: Endoscopy;  Laterality: N/A;   ESOPHAGOGASTRODUODENOSCOPY (EGD) WITH PROPOFOL N/A 07/04/2020   Procedure: ESOPHAGOGASTRODUODENOSCOPY (EGD) WITH PROPOFOL;  Surgeon: Mauri Pole, MD;  Location: [facility];  Service: Endoscopy;  Laterality: N/A;   ESOPHAGOGASTRODUODENOSCOPY (EGD) WITH PROPOFOL N/A 08/11/2020   Procedure: ESOPHAGOGASTRODUODENOSCOPY (EGD) WITH PROPOFOL;  Surgeon: Doran Stabler, MD;  Location: [facility];  Service: Gastroenterology;  Laterality: N/A;   KNEE SURGERY  2005   2 times left   KNEE SURGERY Right    POLYPECTOMY  07/04/2020   Procedure: POLYPECTOMY;  Surgeon: Mauri Pole, MD;  Location: [facility];  Service: Endoscopy;;   TUBAL LIGATION       OB History   No obstetric history on file.     Family History  Problem Relation Age of Onset   Breast cancer Mother 64   Ovarian cancer Mother 21   Brain cancer Mother    Hypertension Father    Hyperlipidemia Father    Heart disease Father    Colon cancer Father        diagnosed in his 23s   Breast cancer Maternal Grandmother 12   Diabetes Paternal Grandmother    Breast cancer Paternal Grandmother 17   Other Son        drug over dose   Colon cancer Maternal Grandfather        dx in his 65s   Liver disease Maternal Grandfather    Breast cancer Maternal Aunt 60   Breast cancer Maternal Aunt 60   Colon cancer Maternal Uncle    Stomach cancer Neg Hx    Pancreatic cancer Neg Hx    Esophageal cancer Neg Hx     Social History   Tobacco Use   Smoking status: Former    Packs/day: 1.00    Years: 15.00    Pack years: 15.00    Types: Cigarettes    Quit date: 09/12/2017    Years since quitting: 3.8   Smokeless tobacco: Never  Vaping Use   Vaping Use: Never used  Substance Use Topics   Alcohol use: Not Currently   Drug use: No    Home Medications Prior to Admission medications   Medication Sig Start Date  End Date Taking? Authorizing Provider  albuterol (VENTOLIN HFA) 108 (90 Base) MCG/ACT inhaler Inhale 1-2 puffs into the lungs every 6 (six) hours as needed for wheezing or shortness of breath. 10/31/20   Tasia Catchings, Amy V, PA-C  ARIPiprazole (ABILIFY) 2 MG tablet Take 2 mg by mouth daily. 07/14/20   [provider]  aspirin-acetaminophen-caffeine (EXCEDRIN MIGRAINE) [phone] MG tablet Take 1 tablet by mouth every 6 (six) hours as needed for headache or migraine.    [provider]  atorvastatin (LIPITOR) 40 MG tablet Take 40 mg by mouth daily.     [provider]  carvedilol (COREG) 3.125 MG tablet Take 3.125 mg by mouth in the morning and at bedtime.    [provider]  dicyclomine (  BENTYL) 10 MG capsule TAKE 1 CAPSULE (10 MG TOTAL) BY MOUTH 4 (FOUR) TIMES DAILY - BEFORE MEALS AND AT BEDTIME. 04/03/21   [name], MD  FLUoxetine (PROZAC) 40 MG capsule Take 40 mg by mouth daily.    [provider]  furosemide (LASIX) 40 MG tablet Take 1 tablet (40 mg total) by mouth daily as needed for fluid or edema. Patient taking differently: Take 40 mg by mouth in the morning. 08/22/20   Mercy Riding, MD  gabapentin (NEURONTIN) 400 MG capsule Take 400 mg by mouth 3 (three) times daily as needed (for nerve pain). 10/20/18   [provider]  ibuprofen (ADVIL) 600 MG tablet Take 1 tablet (600 mg total) by mouth every 6 (six) hours as needed. 06/12/21   Melynda Ripple, MD  LINZESS 145 MCG CAPS capsule TAKE 1 CAPSULE (145 MCG TOTAL) BY MOUTH DAILY BEFORE BREAKFAST. Patient taking differently: Take 145 mcg by mouth daily as needed (for constipation, or as otherwise instructed). 01/03/21   Mauri Pole, MD  mirtazapine (REMERON) 15 MG tablet Take 15 mg by mouth at bedtime. 10/20/20   [provider]  omeprazole (PRILOSEC) 40 MG capsule Take 1 capsule (40 mg total) by mouth daily. Patient taking differently: Take 40 mg by mouth daily before breakfast.  08/29/20   [name], MD  ondansetron (ZOFRAN-ODT) 4 MG disintegrating tablet Take 1-2 tablets by mouth every 6 hours as needed 03/03/21   Levin Erp, PA  oxyCODONE (ROXICODONE) 5 MG immediate release tablet Take 1 tablet (5 mg total) by mouth every 8 (eight) hours as needed. 06/26/21 06/26/22  [name], MD  OXYGEN Inhale 2 L/min into the lungs See admin instructions. 2 L/min at bedtime and as needed for shortness of breath throughout the day    [provider]  Potassium Chloride ER 20 MEQ TBCR Take 20 mEq by mouth daily. 02/05/21   Lorin Glass, PA-C  psyllium (METAMUCIL SMOOTH TEXTURE) 58.6 % powder Take 1 packet by mouth 3 (three) times daily. 08/18/20   Mercy Riding, MD  sucralfate (CARAFATE) 1 g tablet TAKE 1 TABLET (1 G TOTAL) BY MOUTH 4 (FOUR) TIMES DAILY - WITH MEALS AND AT BEDTIME. 12/15/20   Nandigam, Venia Minks, MD  telmisartan (MICARDIS) 40 MG tablet Take 40 mg by mouth daily. 02/23/21   [provider]  tiZANidine (ZANAFLEX) 4 MG tablet Take 1 tablet (4 mg total) by mouth every 8 (eight) hours as needed for muscle spasms. 06/12/21   Melynda Ripple, MD    Allergies    Meperidine, Meperidine hcl, Strawberry extract, Carafate [sucralfate], Strawberry (diagnostic), Wasp venom, and Wasp venom protein  Review of Systems   Review of Systems  Constitutional:  Negative for chills and fever.  HENT:  Negative for nosebleeds.   Eyes:  Negative for pain and visual disturbance.  Respiratory:  Negative for shortness of breath.   Cardiovascular:  Negative for chest pain.  Gastrointestinal:  Negative for abdominal pain and vomiting.  Genitourinary:  Negative for flank pain.  Musculoskeletal:  Positive for back pain and neck pain.  Skin:  Negative for wound.  Neurological:  Negative for weakness and numbness.  Hematological:  Does not bruise/bleed easily.  Psychiatric/Behavioral:  Negative for confusion.    Physical Exam Updated Vital Signs SpO2  90%   Physical Exam Vitals and nursing note reviewed.  Constitutional:      Appearance: Normal appearance. She is well-developed.  HENT:  Head:     Comments: Tenderness posterior scalp.    Nose: Nose normal.     Mouth/Throat:     Mouth: Mucous membranes are moist.  Eyes:     General: No scleral icterus.    Conjunctiva/sclera: Conjunctivae normal.     Pupils: Pupils are equal, round, and reactive to light.  Neck:     Vascular: No carotid bruit.     Trachea: No tracheal deviation.     Comments: Ccollar, trachea midline.  Cardiovascular:     Rate and Rhythm: Normal rate and regular rhythm.     Pulses: Normal pulses.     Heart sounds: Normal heart sounds. No murmur heard.   No friction rub. No gallop.  Pulmonary:     Effort: Pulmonary effort is normal. No respiratory distress.     Breath sounds: Normal breath sounds.  Chest:     Chest wall: No tenderness.  Abdominal:     General: Bowel sounds are normal. There is no distension.     Palpations: Abdomen is soft.     Tenderness: There is no abdominal tenderness. There is no guarding.     Comments: No abd contusion or bruising.   Genitourinary:    Comments: No cva tenderness.  Musculoskeletal:     Cervical back: Normal range of motion and neck supple. No rigidity. No muscular tenderness.     Comments: Tenderness mid cervical, lower thoracic and upper lumbar areas. Spine is aligned, no step off noted. Mild sts and tenderness left lower tibia and left lateral malleolus. Ankle is grossly stable. Distal pulses palp bil. Otherwise, good rom bil extremities without pain or other focal bony tenderness.   Skin:    General: Skin is warm and dry.     Findings: No rash.  Neurological:     Mental Status: She is alert.     Comments: Alert, speech normal. GCS 15. Motor intact bil, stre 5/5. Sens grossly intact.   Psychiatric:        Mood and Affect: Mood normal.    ED Results / Procedures / Treatments   Labs (all labs ordered are  listed, but only abnormal results are displayed) Labs Reviewed - No data to display  EKG None  Radiology No results found.  Procedures Procedures   Medications Ordered in ED Medications - No data to display  ED Course  I have reviewed the triage vital signs and the nursing notes.  Pertinent labs & imaging results that were available during my care of the patient were reviewed by me and considered in my medical decision making (see chart for details).    MDM Rules/Calculators/A&P                         Iv ns. Imaging ordered. Taken off back board. Initially, c spine/collar and log roll precautions.   Pt requests pain med. Dilaudid 1 mg im.   Reviewed nursing notes and prior charts for additional history.   Xrays reviewed/interpreted  by me - no new fx (recent thoracic compression fx noted).   CT reviewed/interpreted by me - no fx or hem.  Recheck pain improved but persists. Recheck abd soft nt. Chest cta. Recheck spine, no midline, point focal tenderness.   Robaxin po. Acetaminophen po. Po fluids. Food. Ambulate in hall/assist.   Pt currently appears stable for d/c.      Final Clinical Impression(s) / ED Diagnoses Final diagnoses:  None    Rx / DC  Orders ED Discharge Orders     None        Lajean Saver, MD 07/01/21 1954

## 2021-07-01 NOTE — ED Triage Notes (Signed)
Slipped and fell while walking on sidewalk. Pt tripped on the cement block for parking. C-collar in place. Complains of 10/10 back pain, left arm and left leg numbness. Swelling to left ankle. Not on blood thinners. Pt is in and out of consciousness and is more lethargic now.

## 2021-07-01 NOTE — ED Notes (Signed)
Pt returned from CT °

## 2021-07-01 NOTE — Discharge Instructions (Addendum)
It was our pleasure to provide your ER care today - we hope that you feel better.  Your imaging studies show your recent thoracic compression fracture, but no new/additional fractures are noted.   Rest. Avoid bending at waist or heavy lifting > 10 lbs for the next week. Fall precautions.   Take your pain medication as recently prescribed by your doctor.   Follow up with primary care doctor in the coming week. Also have blood pressure rechecked then as it is high today.  Return to ER if worse, new or intractable pain, numbness/weakness, or other concern.   You were given pain meds in the ER - no driving for the next 8 hours.

## 2021-07-03 NOTE — Telephone Encounter (Signed)
Per chart patient is scheduled on 07/05/21 to see Dr Roland Rack

## 2021-07-05 DIAGNOSIS — W06XXXA Fall from bed, initial encounter: Secondary | ICD-10-CM | POA: Diagnosis not present

## 2021-07-05 DIAGNOSIS — S40812A Abrasion of left upper arm, initial encounter: Secondary | ICD-10-CM | POA: Diagnosis not present

## 2021-07-05 DIAGNOSIS — S22070A Wedge compression fracture of T9-T10 vertebra, initial encounter for closed fracture: Secondary | ICD-10-CM | POA: Diagnosis not present

## 2021-07-10 ENCOUNTER — Other Ambulatory Visit: Payer: Self-pay | Admitting: Student

## 2021-07-10 ENCOUNTER — Other Ambulatory Visit (HOSPITAL_COMMUNITY): Payer: Self-pay | Admitting: Student

## 2021-07-10 DIAGNOSIS — S22070A Wedge compression fracture of T9-T10 vertebra, initial encounter for closed fracture: Secondary | ICD-10-CM

## 2021-07-12 ENCOUNTER — Other Ambulatory Visit: Payer: Self-pay

## 2021-07-12 ENCOUNTER — Ambulatory Visit
Admission: RE | Admit: 2021-07-12 | Discharge: 2021-07-12 | Disposition: A | Discharge status: Home / Self Care | Payer: Medicare HMO | Source: Ambulatory Visit | Attending: Student | Admitting: Student

## 2021-07-12 DIAGNOSIS — J90 Pleural effusion, not elsewhere classified: Secondary | ICD-10-CM | POA: Diagnosis not present

## 2021-07-12 DIAGNOSIS — S22070A Wedge compression fracture of T9-T10 vertebra, initial encounter for closed fracture: Secondary | ICD-10-CM | POA: Insufficient documentation

## 2021-07-12 DIAGNOSIS — M47814 Spondylosis without myelopathy or radiculopathy, thoracic region: Secondary | ICD-10-CM | POA: Diagnosis not present

## 2021-07-12 DIAGNOSIS — M5124 Other intervertebral disc displacement, thoracic region: Secondary | ICD-10-CM | POA: Diagnosis not present

## 2021-07-19 ENCOUNTER — Other Ambulatory Visit: Payer: Self-pay | Admitting: Orthopedic Surgery

## 2021-07-19 ENCOUNTER — Telehealth: Payer: Self-pay | Admitting: Family Medicine

## 2021-07-19 ENCOUNTER — Encounter: Payer: Self-pay | Admitting: Orthopedic Surgery

## 2021-07-19 DIAGNOSIS — S22070A Wedge compression fracture of T9-T10 vertebra, initial encounter for closed fracture: Secondary | ICD-10-CM | POA: Diagnosis not present

## 2021-07-19 NOTE — Chronic Care Management (AMB) (Signed)
  Chronic Care Management   Outreach Note  07/19/2021 Name: Morgan [name] MRN: [id number] DOB: [birth year]  Referred by: [name], MD Reason for referral : No chief complaint on file.   An unsuccessful telephone outreach was attempted today. The patient was referred to the pharmacist for assistance with care management and care coordination.   Follow Up Plan:   Tatjana Dellinger Upstream Scheduler

## 2021-07-20 ENCOUNTER — Encounter
Admission: RE | Disposition: A | Discharge status: Home / Self Care | Payer: Self-pay | Source: Home / Self Care | Attending: Orthopedic Surgery

## 2021-07-20 ENCOUNTER — Ambulatory Visit: Payer: Medicare HMO

## 2021-07-20 ENCOUNTER — Ambulatory Visit: Payer: Medicare HMO | Admitting: Anesthesiology

## 2021-07-20 ENCOUNTER — Ambulatory Visit
Admission: RE | Admit: 2021-07-20 | Discharge: 2021-07-20 | Disposition: A | Discharge status: Home / Self Care | Payer: Medicare HMO | Attending: Orthopedic Surgery | Admitting: Orthopedic Surgery

## 2021-07-20 ENCOUNTER — Encounter: Payer: Self-pay | Admitting: Orthopedic Surgery

## 2021-07-20 DIAGNOSIS — Z8673 Personal history of transient ischemic attack (TIA), and cerebral infarction without residual deficits: Secondary | ICD-10-CM | POA: Diagnosis not present

## 2021-07-20 DIAGNOSIS — I130 Hypertensive heart and chronic kidney disease with heart failure and stage 1 through stage 4 chronic kidney disease, or unspecified chronic kidney disease: Secondary | ICD-10-CM | POA: Insufficient documentation

## 2021-07-20 DIAGNOSIS — M546 Pain in thoracic spine: Secondary | ICD-10-CM

## 2021-07-20 DIAGNOSIS — Z9981 Dependence on supplemental oxygen: Secondary | ICD-10-CM | POA: Insufficient documentation

## 2021-07-20 DIAGNOSIS — Z9049 Acquired absence of other specified parts of digestive tract: Secondary | ICD-10-CM | POA: Insufficient documentation

## 2021-07-20 DIAGNOSIS — I5032 Chronic diastolic (congestive) heart failure: Secondary | ICD-10-CM | POA: Diagnosis not present

## 2021-07-20 DIAGNOSIS — X58XXXA Exposure to other specified factors, initial encounter: Secondary | ICD-10-CM | POA: Insufficient documentation

## 2021-07-20 DIAGNOSIS — S22080D Wedge compression fracture of T11-T12 vertebra, subsequent encounter for fracture with routine healing: Secondary | ICD-10-CM | POA: Diagnosis not present

## 2021-07-20 DIAGNOSIS — Z8616 Personal history of COVID-19: Secondary | ICD-10-CM | POA: Diagnosis not present

## 2021-07-20 DIAGNOSIS — I2510 Atherosclerotic heart disease of native coronary artery without angina pectoris: Secondary | ICD-10-CM | POA: Insufficient documentation

## 2021-07-20 DIAGNOSIS — Z8249 Family history of ischemic heart disease and other diseases of the circulatory system: Secondary | ICD-10-CM | POA: Diagnosis not present

## 2021-07-20 DIAGNOSIS — Z885 Allergy status to narcotic agent status: Secondary | ICD-10-CM | POA: Diagnosis not present

## 2021-07-20 DIAGNOSIS — J449 Chronic obstructive pulmonary disease, unspecified: Secondary | ICD-10-CM | POA: Diagnosis not present

## 2021-07-20 DIAGNOSIS — Z87891 Personal history of nicotine dependence: Secondary | ICD-10-CM | POA: Diagnosis not present

## 2021-07-20 DIAGNOSIS — Z419 Encounter for procedure for purposes other than remedying health state, unspecified: Secondary | ICD-10-CM

## 2021-07-20 DIAGNOSIS — N183 Chronic kidney disease, stage 3 unspecified: Secondary | ICD-10-CM | POA: Insufficient documentation

## 2021-07-20 DIAGNOSIS — M4854XA Collapsed vertebra, not elsewhere classified, thoracic region, initial encounter for fracture: Secondary | ICD-10-CM | POA: Diagnosis not present

## 2021-07-20 DIAGNOSIS — I340 Nonrheumatic mitral (valve) insufficiency: Secondary | ICD-10-CM | POA: Insufficient documentation

## 2021-07-20 DIAGNOSIS — M8008XA Age-related osteoporosis with current pathological fracture, vertebra(e), initial encounter for fracture: Secondary | ICD-10-CM | POA: Diagnosis not present

## 2021-07-20 DIAGNOSIS — Z79899 Other long term (current) drug therapy: Secondary | ICD-10-CM | POA: Insufficient documentation

## 2021-07-20 DIAGNOSIS — S22070A Wedge compression fracture of T9-T10 vertebra, initial encounter for closed fracture: Secondary | ICD-10-CM | POA: Diagnosis not present

## 2021-07-20 DIAGNOSIS — E785 Hyperlipidemia, unspecified: Secondary | ICD-10-CM | POA: Diagnosis not present

## 2021-07-20 DIAGNOSIS — K219 Gastro-esophageal reflux disease without esophagitis: Secondary | ICD-10-CM | POA: Diagnosis not present

## 2021-07-20 DIAGNOSIS — Z90710 Acquired absence of both cervix and uterus: Secondary | ICD-10-CM | POA: Insufficient documentation

## 2021-07-20 HISTORY — PX: KYPHOPLASTY: SHX5884

## 2021-07-20 SURGERY — KYPHOPLASTY
Anesthesia: General

## 2021-07-20 MED ORDER — MIDAZOLAM HCL 2 MG/2ML IJ SOLN
INTRAMUSCULAR | Status: AC
  Filled 2021-07-20: qty 2

## 2021-07-20 MED ORDER — ONDANSETRON HCL 4 MG PO TABS: 4.0000 mg | ORAL_TABLET | Freq: Four times a day (QID) | ORAL | Status: DC | PRN

## 2021-07-20 MED ORDER — CHLORHEXIDINE GLUCONATE 0.12 % MT SOLN: 15.0000 mL | Freq: Once | OROMUCOSAL | Status: AC

## 2021-07-20 MED ORDER — SODIUM CHLORIDE 0.9 % IV SOLN: INTRAVENOUS | Status: DC

## 2021-07-20 MED ORDER — METOCLOPRAMIDE HCL 10 MG PO TABS: 5.0000 mg | ORAL_TABLET | Freq: Three times a day (TID) | ORAL | Status: DC | PRN

## 2021-07-20 MED ORDER — CHLORHEXIDINE GLUCONATE 0.12 % MT SOLN
OROMUCOSAL | Status: AC
  Administered 2021-07-20: 15 mL via OROMUCOSAL
  Filled 2021-07-20: qty 15

## 2021-07-20 MED ORDER — IOHEXOL 180 MG/ML  SOLN
INTRAMUSCULAR | Status: DC | PRN
  Administered 2021-07-20: 10 mL

## 2021-07-20 MED ORDER — FENTANYL CITRATE (PF) 100 MCG/2ML IJ SOLN
INTRAMUSCULAR | Status: AC
  Filled 2021-07-20: qty 2

## 2021-07-20 MED ORDER — FENTANYL CITRATE (PF) 100 MCG/2ML IJ SOLN
INTRAMUSCULAR | Status: DC | PRN
  Administered 2021-07-20: 25 ug via INTRAVENOUS
  Administered 2021-07-20: 50 ug via INTRAVENOUS
  Administered 2021-07-20: 25 ug via INTRAVENOUS

## 2021-07-20 MED ORDER — OXYCODONE HCL 5 MG PO TABS: 5.0000 mg | ORAL_TABLET | Freq: Three times a day (TID) | ORAL | 0 refills | Status: DC | PRN

## 2021-07-20 MED ORDER — LACTATED RINGERS IV SOLN: INTRAVENOUS | Status: DC

## 2021-07-20 MED ORDER — LIDOCAINE HCL (PF) 1 % IJ SOLN
INTRAMUSCULAR | Status: AC
  Filled 2021-07-20: qty 30

## 2021-07-20 MED ORDER — BUPIVACAINE HCL (PF) 0.5 % IJ SOLN
INTRAMUSCULAR | Status: DC | PRN
  Administered 2021-07-20: 20 mL

## 2021-07-20 MED ORDER — LIDOCAINE HCL 1 % IJ SOLN
INTRAMUSCULAR | Status: DC | PRN
  Administered 2021-07-20: 20 mL
  Administered 2021-07-20: 10 mL

## 2021-07-20 MED ORDER — MIDAZOLAM HCL 2 MG/2ML IJ SOLN
INTRAMUSCULAR | Status: DC | PRN
  Administered 2021-07-20 (×2): 1 mg via INTRAVENOUS

## 2021-07-20 MED ORDER — 0.9 % SODIUM CHLORIDE (POUR BTL) OPTIME
TOPICAL | Status: DC | PRN
  Administered 2021-07-20: 50 mL

## 2021-07-20 MED ORDER — ORAL CARE MOUTH RINSE: 15.0000 mL | Freq: Once | OROMUCOSAL | Status: AC

## 2021-07-20 MED ORDER — OXYCODONE HCL 5 MG PO TABS
5.0000 mg | ORAL_TABLET | Freq: Once | ORAL | Status: AC | PRN
  Administered 2021-07-20: 5 mg via ORAL

## 2021-07-20 MED ORDER — OXYCODONE HCL 5 MG/5ML PO SOLN: 5.0000 mg | Freq: Once | ORAL | Status: AC | PRN

## 2021-07-20 MED ORDER — CEFAZOLIN SODIUM-DEXTROSE 2-4 GM/100ML-% IV SOLN
2.0000 g | INTRAVENOUS | Status: AC
  Administered 2021-07-20: 2 g via INTRAVENOUS

## 2021-07-20 MED ORDER — METOCLOPRAMIDE HCL 5 MG/ML IJ SOLN: 5.0000 mg | Freq: Three times a day (TID) | INTRAMUSCULAR | Status: DC | PRN

## 2021-07-20 MED ORDER — BUPIVACAINE HCL (PF) 0.5 % IJ SOLN
INTRAMUSCULAR | Status: AC
  Filled 2021-07-20: qty 30

## 2021-07-20 MED ORDER — ACETAMINOPHEN 10 MG/ML IV SOLN: 1000.0000 mg | Freq: Once | INTRAVENOUS | Status: DC | PRN

## 2021-07-20 MED ORDER — CEFAZOLIN SODIUM-DEXTROSE 2-4 GM/100ML-% IV SOLN
INTRAVENOUS | Status: AC
  Filled 2021-07-20: qty 100

## 2021-07-20 MED ORDER — ONDANSETRON HCL 4 MG/2ML IJ SOLN: 4.0000 mg | Freq: Once | INTRAMUSCULAR | Status: DC | PRN

## 2021-07-20 MED ORDER — PROPOFOL 500 MG/50ML IV EMUL
INTRAVENOUS | Status: DC | PRN
  Administered 2021-07-20: 50 ug/kg/min via INTRAVENOUS

## 2021-07-20 MED ORDER — OXYCODONE HCL 5 MG PO TABS
ORAL_TABLET | ORAL | Status: AC
  Filled 2021-07-20: qty 1

## 2021-07-20 MED ORDER — ONDANSETRON HCL 4 MG/2ML IJ SOLN: 4.0000 mg | Freq: Four times a day (QID) | INTRAMUSCULAR | Status: DC | PRN

## 2021-07-20 MED ORDER — FENTANYL CITRATE (PF) 100 MCG/2ML IJ SOLN
25.0000 ug | INTRAMUSCULAR | Status: DC | PRN
  Administered 2021-07-20 (×2): 25 ug via INTRAVENOUS

## 2021-07-20 SURGICAL SUPPLY — 23 items
ADH SKN CLS APL DERMABOND .7 (GAUZE/BANDAGES/DRESSINGS) ×1
CEMENT KYPHON CX01A KIT/MIXER (Cement) ×2 IMPLANT
DERMABOND ADVANCED (GAUZE/BANDAGES/DRESSINGS) ×1
DERMABOND ADVANCED .7 DNX12 (GAUZE/BANDAGES/DRESSINGS) ×1 IMPLANT
DEVICE BIOPSY BONE KYPH (INSTRUMENTS) ×2 IMPLANT
DEVICE BIOPSY BONE KYPHX (INSTRUMENTS) ×2 IMPLANT
DRAPE C-ARM XRAY 36X54 (DRAPES) ×2 IMPLANT
DURAPREP 26ML APPLICATOR (WOUND CARE) ×2 IMPLANT
GAUZE 4X4 16PLY ~~LOC~~+RFID DBL (SPONGE) ×2 IMPLANT
GLOVE SURG SYN 9.0  PF PI (GLOVE) ×2
GLOVE SURG SYN 9.0 PF PI (GLOVE) ×1 IMPLANT
GOWN SRG 2XL LVL 4 RGLN SLV (GOWNS) ×1 IMPLANT
GOWN STRL NON-REIN 2XL LVL4 (GOWNS) ×2
GOWN STRL REUS W/ TWL LRG LVL3 (GOWN DISPOSABLE) ×1 IMPLANT
GOWN STRL REUS W/TWL LRG LVL3 (GOWN DISPOSABLE) ×2
MANIFOLD NEPTUNE II (INSTRUMENTS) ×2 IMPLANT
PACK KYPHOPLASTY (MISCELLANEOUS) ×2 IMPLANT
RENTAL RFA GENERATOR (MISCELLANEOUS) IMPLANT
STRAP SAFETY 5IN WIDE (MISCELLANEOUS) ×2 IMPLANT
SWABSTK COMLB BENZOIN TINCTURE (MISCELLANEOUS) IMPLANT
TRAY KYPHOPAK 15/2 EXPRESS (KITS) ×2 IMPLANT
TRAY KYPHOPAK 15/3 EXPRESS 1ST (MISCELLANEOUS) IMPLANT
TRAY KYPHOPAK 20/3 EXPRESS 1ST (MISCELLANEOUS) IMPLANT

## 2021-07-20 NOTE — H&P (Signed)
Chief Complaint  Patient presents with   Middle Back - Pain  T10 Compression fx w/ 30% height loss    History of the Present Illness: Morgan [name] is a 70 y.o. female here today.   The patient presents for evaluation of a T10 compression fracture. She has had an MRI at the hospital at [facility]. MRI of the thoracic spine was done on 07/12/2021, showing extensive edema throughout T10.  The patient states she fell out of bed approximately 3 weeks ago. She presented to the emergency department at [facility] on 06.27/2022. She notes she was at the pool 2 weeks later and tripped and fell. She states she bruised her ribs. She states she has pain to her back. The patient takes hydrocodone for pain. She states she doubles up at times due to pain. She has to nap due to lack of sleep.   The patient is not on any blood thinners. She has hypertension and heart disease, and she is on oxygen at night.   I have reviewed past medical, surgical, social and family history, and allergies as documented in the EMR.  Past Medical History: Past Medical History:  Diagnosis Date   Acute midline thoracic back pain 06/26/2021  Last Assessment & Plan: Formatting of this note might be different from the original. From fall with point tenderness. XR T-spine reassuring - will follow-up final read. Oxycodone for severe pain. Advised limiting dosage. Will also plan for referral due to recurrent severe pain response.   Acute pain of right knee 12/29/2019  Last Assessment & Plan: Formatting of this note might be different from the original. Pt with hx of meniscus tear requiring surgery now with acute knee pain unable to ambulate. No specific injury so did not get XR today, but given surgical hx referral to ortho for further evaluation and treatment.   Anxiety 08/26/2017  Last Assessment & Plan: Formatting of this note might be different from the original. Follows with [facility] - on fluoxetine 40 mg and abilify. Worsening symptoms - she will  call [facility] to discuss and reach out to therapists. Unfortunately cannot find alternative living situation   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CMS-HCC) 05/08/2018  10/2017 cMRI at [place] 4.0cm 04/2018 CTA at [place] 4.3cm 05/27/18: MRI [facility] 4.1 cm   Asymptomatic bacteriuria 02/19/2019  Last Assessment & Plan: Formatting of this note might be different from the original. UA with leukocytosis and nitrites, but no symptoms currently. Hx of this in the hospital stay. Will repeat culture today, no symptoms currently. Unclear if related to fall.   Chest pressure 02/20/2021  Last Assessment & Plan: Formatting of this note might be different from the original. Pt with 1 day of chest pressure and orthopnea. Attempted to get EKG in the office but pt unable to lay flat. Discussed concern for possible cardiac cause and recommend ER for evaluation to rule out heart attack. Given orthopnea suspect CHF exacerbation though wheezes on exam but w/o complaining of worsening COPD   CHF (congestive heart failure) (CMS-HCC)   Chronic cholecystitis 05/19/2020   Chronic diastolic CHF (congestive heart failure) (CMS-HCC) 08/09/2020  Last Assessment & Plan: Formatting of this note might be different from the original. Stable edema and no crackles. Cont current management   Chronic fatigue 06/13/2018   Chronic respiratory failure with hypoxia (CMS-HCC) 08/09/2020  Last Assessment & Plan: Formatting of this note might be different from the original. Breathing well on RA. Lungs clear. Continue home management   CKD (chronic kidney disease) stage 3,  GFR 30-59 ml/min (CMS-HCC) 07/22/2016  Last Assessment & Plan: Formatting of this note might be different from the original. Cr stable   COPD (chronic obstructive pulmonary disease) (CMS-HCC) 01/14/2018  Last Assessment & Plan: Formatting of this note might be different from the original. Good air movement, no wheezes. Endorses sob with activity and fatigue but low suspicion for COPD exacerbation.  Suspect is related to pain and post-hospital fatigue.   Coronary artery disease   COVID-19 08/2020   CVA (cerebral vascular accident) (CMS-HCC) 08/22/2018   Depression 07/22/2016  Formatting of this note might be different from the original. Was on fluoxetine and benztropine   Depression (emotion)   Diarrhea 02/22/2015  Last Assessment & Plan: Formatting of this note might be different from the original. Pt with new onset diarrhea in setting of antibiotic treatment. She has had some recent hospitalizations and abdominal pain worsening with hypoactive bowel sounds. Will test Cdiff to rule out, though may be an antibiotic side effect. Encouraged hydration   Diminished pulses in lower extremity 07/22/2016   Dizziness 01/13/2019  Last Assessment & Plan: Formatting of this note might be different from the original. Symptoms unclear. It could be vertigo based on room spinning vs orthostatic given lasix. However, description more consistent with vertigo. Short lasting symptoms - hand out provided and pt already with plan to see ENT - recommended mention during that visit.   Dysphagia 11/12/2018  Last Assessment & Plan: Formatting of this note might be different from the original. Notes hx of esophageal strictures and needing them dilated and now with 6 months of progressive swallowing difficulty with saliva. Is able to swallow food/water w/o difficulty. GI referral   Essential hypertension 07/22/2016  Last Assessment & Plan: Formatting of this note might be different from the original. Elevated likely 2/2 to pain. Follows with cardiology. Cont coreg and telmisartan   Fall 04/08/2019  Last Assessment & Plan: Formatting of this note might be different from the original. Seems most consistent with orthostatic syncope possibly based on middle of the night and getting up. Will see if home health can obtain orthostatic vital signs today. Advised slow transitions. If persisting or recurrent will need follow-up appointment  and possibly cardiology appointment   Former tobacco use 07/22/2016   Gastroesophageal reflux disease 10/04/2017   Hoarseness 11/12/2018  Last Assessment & Plan: Formatting of this note might be different from the original. Persisting beyond CVA. 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 Likely some of it is secondary to her chronic kidney disease. We did discuss potential referral to hematology if this continues as well.   Left-sided weakness 10/04/2017   Non-rheumatic mitral regurgitation 11/21/2017  Mod on 10/2017 cMRI. Mild by 2017 ECHO. 05/27/18: MRI mild to moderate MR   Occipital neuralgia of right side 10/13/2017   Other chronic pain 06/26/2021  Last Assessment & Plan: Formatting of this note might be different from the original. Patient has had this left upper quadrant chronic abdominal pain for quite some time at this point. Patient has many different possibilities including congestive heart failure, ascending aortic aneurysm, chronic respiratory difficulties, chronic kidney disease and a history of CVA. These could all in their own   Oxygen dependent 03/30/2020  Formatting of this note might be different from the original. At bedtime  Pain of lumbar spine 12/30/2019   Peptic ulcer disease 03/22/2020  Last Assessment & Plan: Formatting of this note might be different from the original. Continues to have daily nausea. Refill provided. Advised GI f/u - appreciate support Last Assessment & Plan: Formatting of this note might be different from the original. Continue sucralfate and omeprazole. Notes worsening abdominal pain w/ eating and unclear if heartburn related. 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 With nausea, vomiting resolved. Trial of liquid diet. Has had 2 dilations and reports another planned at next GI appt. Advised reaching out to GI given persistence of symptoms (nausea/abdominal pain). Advised full liquid diet for a few days to see if symptoms improve and slow advancement. Difficult to determine   Pylorus ulcer 07/05/2021   Recurrent major depressive disorder, in partial remission (CMS-HCC) 10/13/2017   Shortness of breath 01/13/2018   Sleepiness 11/12/2018  Last Assessment & Plan: Formatting of this note might be different from the original. Sleepiness + some confusion in setting of recent seizure diagnosis concerning for possible post-ictal state, though no seizure episodes. Will get some labs today, but also advised reaching out to Neurologist   Stroke (CMS-HCC)   Tremor   Viral gastroenteritis 02/19/2019   Past Surgical History: Past Surgical History:  Procedure Laterality Date   CARDIAC CATHETERIZATION   CHOLECYSTECTOMY  dr Hassell Done @ [facility]   esopaghus stretch 2006  @ [facility]   HYSTERECTOMY N/A 2002  in [place] is all the patient can remember   KNEE ARTHROSCOPY Left 2020  dr Theda Sers @ [facility]   KNEE ARTHROSCOPY Right 2018  dr Theda Sers @ [facility]   Past Family History: Family History  Problem Relation Age of Onset   Hyperlipidemia (Elevated cholesterol) Father   High blood pressure (Hypertension) Father   Medications: Current Outpatient Medications Ordered in Epic  Medication Sig Dispense Refill   albuterol 90 mcg/actuation inhaler TAKE 2 PUFFS BY MOUTH EVERY 6 HOURS AS NEEDED FOR WHEEZE OR SHORTNESS OF BREATH   atorvastatin (LIPITOR) 40 MG tablet Take 1 tablet (40 mg total) by mouth once daily 90 tablet 3   carvediloL (COREG) 6.25 MG tablet Take 1 tablet (6.25 mg total) by mouth 2 (two) times daily with meals 180 tablet 4    esomeprazole (NEXIUM) 40 MG DR capsule TAKE 1 CAPSULE (40 MG TOTAL) BY MOUTH DAILY AT 12 NOON.   FLUoxetine (PROZAC) 40 MG capsule Take 40 mg by mouth once daily   FUROsemide (LASIX) 40 MG tablet TAKE 1 TABLET BY MOUTH EVERY DAY 90 tablet 3   gabapentin (NEURONTIN) 400 MG capsule (pt takes 3 tabs, 1200 mg at night)   HYDROcodone-acetaminophen (NORCO) 5-325 mg tablet Take 1 tablet by mouth every 6 (six) hours as needed for Pain 20 tablet 0   hydrOXYzine (ATARAX) 25 MG tablet   ibuprofen (MOTRIN) 600 MG tablet Take 600 mg by mouth every 6 (six) hours as needed   linaCLOtide (LINZESS) 145 mcg capsule Take 145 mcg by mouth as needed (constipation)   meloxicam (MOBIC) 7.5 MG tablet Take 1 tablet by mouth once daily   mirtazapine (REMERON) 45 MG tablet Take 45 mg by mouth nightly   omeprazole (PRILOSEC) 40 MG DR capsule Take 40 mg by mouth once daily   ondansetron (  ZOFRAN-ODT) 4 MG disintegrating tablet Take 4 mg by mouth every 8 (eight) hours as needed   oxyCODONE (ROXICODONE) 5 MG immediate release tablet Take 5 mg by mouth every 8 (eight) hours as needed   tiZANidine (ZANAFLEX) 4 MG tablet TAKE 1 TABLET (4 MG TOTAL) BY MOUTH EVERY 8 (EIGHT) HOURS AS NEEDED FOR MUSCLE SPASMS   valsartan (DIOVAN) 320 MG tablet Take 1 tablet (320 mg total) by mouth once daily 90 tablet 4   No current Epic-ordered facility-administered medications on file.   Allergies: Allergies  Allergen Reactions   Meperidine Hives and Anaphylaxis   Strawberry Hives, Angioedema and Swelling   Sucralfate Nausea and Other (See Comments)  Severe nausea   Venom-Wasp Hives   Wasp Venom Hives    Body mass index is 32.83 kg/m.  Review of Systems: A comprehensive 14 point ROS was performed, reviewed, and the pertinent orthopaedic findings are documented in the HPI.  Vitals:  07/19/21 1304  BP: 130/74    General Physical Examination:   General/Constitutional: No apparent distress: well-nourished and well developed. Eyes:  Pupils equal, round with synchronous movement. Lungs:  Some wheezing is noted. HEENT:  Normal Vascular: No edema, swelling or tenderness, except as noted in detailed exam. Cardiac: Heart rate and rhythm is regular. Integumentary: No impressive skin lesions present, except as noted in detailed exam. Neuro/Psych: Normal mood and affect, oriented to person, place and time.  Musculoskeletal Examination:  On exam, tenderness to T10. Three beats of clonus bilaterally. Back pain with straight leg raise.  Radiographs:  No new imaging studies were obtained today.  Assessment: T 10 compression fracture Unrelenting pain  Plan:  The patient has clinical findings of T10 compression fracture.  We discussed the patient's prior MRI findings. I explained she has a significant amount of edema in her bone. She has debilitating pain secondary to her T10 fracture that is unresponsive to narcotics and activity modification. The pain has been present for over 5 weeks since her initial injury, with a recent MRI showing edema. She is a good candidate for kyphoplasty. We will get her scheduled for that tomorrow. I explained the surgery and postoperative course in detail.  We will schedule the patient for T10 vertebroplasty in the near future.  Surgical Risks:  The nature of the condition and the proposed procedure has been reviewed in detail with the patient. Surgical versus non-surgical options and prognosis for recovery have been reviewed and the inherent risks and benefits of each have been discussed including the risks of infection, bleeding, injury to nerves/blood vessels/tendons, incomplete relief of symptoms, persisting pain and/or stiffness, loss of function, complex regional pain syndrome, failure of the procedure, as appropriate.  Attestation: I, Dawn Royse, am documenting for [employer], MD utilizing [place].    Electronically signed by [name], MD at 07/19/2021 6:49 PM  EDT  Reviewed  H+P. No changes noted.

## 2021-07-20 NOTE — Transfer of Care (Signed)
Immediate Anesthesia Transfer of Care Note  Patient: Morgan [name]  Procedure(s) Performed: T 10KYPHOPLASTY  Patient Location: PACU  Anesthesia Type:General  Level of Consciousness: awake  Airway & Oxygen Therapy: Patient Spontanous Breathing and Patient connected to face mask oxygen  Post-op Assessment: Report given to RN  Post vital signs: stable  Last Vitals:  Vitals Value Taken Time  BP    Temp    Pulse 76 07/20/21 1550  Resp 18 07/20/21 1550  SpO2 100 % 07/20/21 1550  Vitals shown include unvalidated device data.  Last Pain:  Vitals:   07/20/21 1310  TempSrc: Temporal  PainSc: 10-Worst pain ever      Patients Stated Pain Goal: 2 ([id number])  Complications: No notable events documented.

## 2021-07-20 NOTE — Discharge Instructions (Addendum)
Take it easy today and try to start walking is much as you can tomorrow. Pain medicine as directed Remove Band-Aid on Saturday then okay to shower Call office if you are having problems   AMBULATORY SURGERY  DISCHARGE INSTRUCTIONS   The drugs that you were given will stay in your system until tomorrow so for the next 24 hours you should not:  Drive an automobile Make any legal decisions Drink any alcoholic beverage   You may resume regular meals tomorrow.  Today it is better to start with liquids and gradually work up to solid foods.  You may eat anything you prefer, but it is better to start with liquids, then soup and crackers, and gradually work up to solid foods.   Please notify your doctor immediately if you have any unusual bleeding, trouble breathing, redness and pain at the surgery site, drainage, fever, or pain not relieved by medication.    Additional Instructions:        Please contact your physician with any problems or Same Day Surgery at [phone], Monday through Friday 6 am to 4 pm, or [place] at [facility] number at [phone].

## 2021-07-20 NOTE — Op Note (Signed)
07/20/2021  3:47 PM  PATIENT:  Morgan [name]   MRN: [id number]   PRE-OPERATIVE DIAGNOSIS:  closed wedge compression fracture of T10   POST-OPERATIVE DIAGNOSIS:  closed wedge compression fracture of T10   PROCEDURE:  Procedure(s): KYPHOPLASTY T10  SURGEON: Laurene Footman, MD   ASSISTANTS: None   ANESTHESIA:   local and MAC   EBL:  No intake/output data recorded.   BLOOD ADMINISTERED:none   DRAINS: none    LOCAL MEDICATIONS USED:  MARCAINE    and XYLOCAINE    SPECIMEN: T10 vertebral body biopsy   DISPOSITION OF SPECIMEN: Pathology   COUNTS:  YES   TOURNIQUET:  * No tourniquets in log *   IMPLANTS: Bone cement   DICTATION: .Dragon Dictation  patient was brought to the operating room and after adequate anesthesia was obtained the patient was placed prone.  C arm was brought in in good visualization of the affected level obtained on both AP and lateral projections.  After patient identification and timeout procedures were completed, local anesthetic was infiltrated with 10 cc 1% Xylocaine infiltrated subcutaneously.  This is done the area on the each side of the planned approach.  The back was then prepped and draped in the usual sterile manner and repeat timeout procedure carried out.  A spinal needle was brought down to the pedicle on the each side of T10 and a 50-50 mix of 1% Xylocaine half percent Sensorcaine with epinephrine total of 20 cc injected on each side.  After allowing this to set a small incision was made and the trocar was advanced into the vertebral body in an extrapedicular fashion.  Biopsy was obtained drilling was carried out balloon inserted with inflation to 2.5 cc on the right and across the midline so left-sided stick was not performed.  When the cement was appropriate consistency 5 cc were injected on the right with a small amount of extravasation into the T10-11 disc space.  good fill superior to inferior endplates and from right to left sides along the  inferior endplate.  After the cement had set the trochar was removed and permanent C-arm views obtained.  The wound was closed with Dermabond followed by Band-Aid   PLAN OF CARE: Discharge home after recovery room   PATIENT DISPOSITION:  PACU - hemodynamically stable.

## 2021-07-20 NOTE — Anesthesia Preprocedure Evaluation (Signed)
Anesthesia Evaluation  Patient identified by MRN, date of birth, ID band Patient awake    Reviewed: Allergy & Precautions, NPO status , Patient's Chart, lab work & pertinent test results  History of Anesthesia Complications Negative for: history of anesthetic complications  Airway Mallampati: II  TM Distance: >3 FB Neck ROM: Full    Dental  (+) Upper Dentures, Lower Dentures   Pulmonary neg sleep apnea, COPD,  oxygen dependent, Patient abstained from smoking.Not current smoker, former smoker,  3L/min O2 by nasal cannula at night   Pulmonary exam normal breath sounds clear to auscultation       Cardiovascular Exercise Tolerance: Poor METShypertension, +CHF  (-) CAD and (-) Past MI (-) dysrhythmias  Rhythm:Regular Rate:Normal - Systolic murmurs TTE [id number] relatively unremarkable   Neuro/Psych PSYCHIATRIC DISORDERS Anxiety Depression CVA, No Residual Symptoms    GI/Hepatic GERD  Controlled,(+)     (-) substance abuse  ,   Endo/Other  neg diabetes  Renal/GU CRFRenal disease     Musculoskeletal  (+) Arthritis ,   Abdominal   Peds  Hematology   Anesthesia Other Findings Past Medical History: No date: Allergy No date: Anemia No date: Anxiety No date: Arthritis No date: CHF (congestive heart failure) (HCC) No date: Depression No date: GERD (gastroesophageal reflux disease) No date: Hypertension No date: MVP (mitral valve prolapse)  Reproductive/Obstetrics                             Anesthesia Physical Anesthesia Plan  ASA: 3  Anesthesia Plan: General   Post-op Pain Management:    Induction: Intravenous  PONV Risk Score and Plan: 3 and Ondansetron, Propofol infusion, TIVA and Treatment may vary due to age or medical condition  Airway Management Planned: Nasal Cannula  Additional Equipment: None  Intra-op Plan:   Post-operative Plan:   Informed Consent: I have reviewed the  patients History and Physical, chart, labs and discussed the procedure including the risks, benefits and alternatives for the proposed anesthesia with the patient or authorized representative who has indicated his/her understanding and acceptance.     Dental advisory given  Plan Discussed with: CRNA and Surgeon  Anesthesia Plan Comments: (Discussed risks of anesthesia with patient, including possibility of difficulty with spontaneous ventilation under anesthesia necessitating airway intervention, PONV, and rare risks such as cardiac or respiratory or neurological events, and allergic reactions. Patient understands.)        Anesthesia Quick Evaluation

## 2021-07-21 ENCOUNTER — Encounter: Payer: Self-pay | Admitting: Orthopedic Surgery

## 2021-07-21 NOTE — Anesthesia Postprocedure Evaluation (Signed)
Anesthesia Post Note  Patient: Morgan [name]  Procedure(s) Performed: T 10KYPHOPLASTY  Patient location during evaluation: PACU Anesthesia Type: General Level of consciousness: awake and alert Pain management: pain level controlled Vital Signs Assessment: post-procedure vital signs reviewed and stable Respiratory status: spontaneous breathing, nonlabored ventilation, respiratory function stable and patient connected to nasal cannula oxygen Cardiovascular status: blood pressure returned to baseline and stable Postop Assessment: no apparent nausea or vomiting Anesthetic complications: no   No notable events documented.   Last Vitals:  Vitals:   07/20/21 1615 07/20/21 1636  BP: (!) 162/91 (!) 183/87  Pulse: 76 73  Resp: 20 16  Temp:  36.6 C  SpO2: 96% 98%    Last Pain:  Vitals:   07/20/21 1636  TempSrc: Temporal  PainSc: 3                  Arita Miss

## 2021-07-24 LAB — SURGICAL PATHOLOGY

## 2021-07-25 DIAGNOSIS — J449 Chronic obstructive pulmonary disease, unspecified: Secondary | ICD-10-CM | POA: Diagnosis not present

## 2021-07-25 DIAGNOSIS — I509 Heart failure, unspecified: Secondary | ICD-10-CM | POA: Diagnosis not present

## 2021-07-26 ENCOUNTER — Telehealth: Payer: Self-pay | Admitting: Family Medicine

## 2021-07-26 NOTE — Progress Notes (Signed)
  Chronic Care Management   Note  07/26/2021 Name: JHANIA TRENCHARD MRN: [id number] DOB: 1951-05-25  ZAALIYAH OTTUM is a 70 y.o. year old female who is a primary care patient of Einar Pheasant, Jobe Marker, MD. I reached out to Valaria Good by phone today in response to a referral sent by Ms. Smith Robert Childress's PCP, [name], MD.   Ms. Scull was given information about Chronic Care Management services today including:  CCM service includes personalized support from designated clinical staff supervised by her physician, including individualized plan of care and coordination with other care providers 24/7 contact phone numbers for assistance for urgent and routine care needs. Service will only be billed when office clinical staff spend 20 minutes or more in a month to coordinate care. Only one practitioner may furnish and bill the service in a calendar month. The patient may stop CCM services at any time (effective at the end of the month) by phone call to the office staff.   Patient agreed to services and verbal consent obtained.   Follow up plan:   Tatjana Secretary/administrator

## 2021-07-31 ENCOUNTER — Telehealth: Payer: Self-pay | Admitting: *Deleted

## 2021-07-31 DIAGNOSIS — S22070A Wedge compression fracture of T9-T10 vertebra, initial encounter for closed fracture: Secondary | ICD-10-CM | POA: Diagnosis not present

## 2021-07-31 DIAGNOSIS — S22080A Wedge compression fracture of T11-T12 vertebra, initial encounter for closed fracture: Secondary | ICD-10-CM | POA: Diagnosis not present

## 2021-07-31 DIAGNOSIS — E2839 Other primary ovarian failure: Secondary | ICD-10-CM

## 2021-07-31 NOTE — Telephone Encounter (Signed)
Patient left a voicemail stating that she has another compression fracture in her back. Patient wants to know if she should have a density test done to see if she has osteoporosis.

## 2021-07-31 NOTE — Telephone Encounter (Signed)
I think a dexa is good idea.   She is also due for Mammogram  Please call pt and advise she schedule dexa and mammogram at [facility] as soon as able.     [place] [facility]   Phone:  [address], [place] [phone]                                            Services: 3D Mammogram and Bone Density

## 2021-07-31 NOTE — Telephone Encounter (Signed)
Spoke to pt and relayed Dr. Verda Cumins message. Pt was driving so she said she would look up the number to [place] and call to schedule.

## 2021-08-10 ENCOUNTER — Other Ambulatory Visit: Payer: Self-pay

## 2021-08-10 ENCOUNTER — Ambulatory Visit
Admission: RE | Admit: 2021-08-10 | Discharge: 2021-08-10 | Disposition: A | Discharge status: Home / Self Care | Payer: Medicare HMO | Source: Ambulatory Visit | Attending: Family Medicine | Admitting: Family Medicine

## 2021-08-10 ENCOUNTER — Other Ambulatory Visit: Payer: Self-pay | Admitting: Orthopedic Surgery

## 2021-08-10 ENCOUNTER — Other Ambulatory Visit (HOSPITAL_BASED_OUTPATIENT_CLINIC_OR_DEPARTMENT_OTHER): Payer: Self-pay | Admitting: Orthopedic Surgery

## 2021-08-10 DIAGNOSIS — E2839 Other primary ovarian failure: Secondary | ICD-10-CM

## 2021-08-10 DIAGNOSIS — M8589 Other specified disorders of bone density and structure, multiple sites: Secondary | ICD-10-CM | POA: Diagnosis not present

## 2021-08-10 DIAGNOSIS — Z780 Asymptomatic menopausal state: Secondary | ICD-10-CM | POA: Diagnosis not present

## 2021-08-10 DIAGNOSIS — S22070A Wedge compression fracture of T9-T10 vertebra, initial encounter for closed fracture: Secondary | ICD-10-CM

## 2021-08-13 ENCOUNTER — Other Ambulatory Visit: Payer: Self-pay | Admitting: Family Medicine

## 2021-08-13 DIAGNOSIS — K279 Peptic ulcer, site unspecified, unspecified as acute or chronic, without hemorrhage or perforation: Secondary | ICD-10-CM

## 2021-08-18 ENCOUNTER — Ambulatory Visit
Admission: RE | Admit: 2021-08-18 | Discharge: 2021-08-18 | Disposition: A | Discharge status: Home / Self Care | Payer: Medicare HMO | Source: Ambulatory Visit | Attending: Orthopedic Surgery | Admitting: Orthopedic Surgery

## 2021-08-18 ENCOUNTER — Other Ambulatory Visit: Payer: Self-pay

## 2021-08-18 DIAGNOSIS — M47814 Spondylosis without myelopathy or radiculopathy, thoracic region: Secondary | ICD-10-CM | POA: Diagnosis not present

## 2021-08-18 DIAGNOSIS — M5124 Other intervertebral disc displacement, thoracic region: Secondary | ICD-10-CM | POA: Diagnosis not present

## 2021-08-18 DIAGNOSIS — S22070A Wedge compression fracture of T9-T10 vertebra, initial encounter for closed fracture: Secondary | ICD-10-CM | POA: Diagnosis not present

## 2021-08-18 DIAGNOSIS — G9519 Other vascular myelopathies: Secondary | ICD-10-CM | POA: Diagnosis not present

## 2021-08-22 ENCOUNTER — Other Ambulatory Visit: Payer: Self-pay | Admitting: Orthopedic Surgery

## 2021-08-23 ENCOUNTER — Ambulatory Visit: Payer: Medicare HMO | Admitting: Certified Registered"

## 2021-08-23 ENCOUNTER — Encounter
Admission: RE | Disposition: A | Discharge status: Home / Self Care | Payer: Self-pay | Source: Home / Self Care | Attending: Orthopedic Surgery

## 2021-08-23 ENCOUNTER — Ambulatory Visit: Payer: Medicare HMO

## 2021-08-23 ENCOUNTER — Telehealth: Payer: Self-pay | Admitting: Family Medicine

## 2021-08-23 ENCOUNTER — Ambulatory Visit
Admission: RE | Admit: 2021-08-23 | Discharge: 2021-08-23 | Disposition: A | Discharge status: Home / Self Care | Payer: Medicare HMO | Attending: Orthopedic Surgery | Admitting: Orthopedic Surgery

## 2021-08-23 DIAGNOSIS — Z419 Encounter for procedure for purposes other than remedying health state, unspecified: Secondary | ICD-10-CM

## 2021-08-23 DIAGNOSIS — S22070A Wedge compression fracture of T9-T10 vertebra, initial encounter for closed fracture: Secondary | ICD-10-CM | POA: Diagnosis not present

## 2021-08-23 DIAGNOSIS — N183 Chronic kidney disease, stage 3 unspecified: Secondary | ICD-10-CM | POA: Diagnosis not present

## 2021-08-23 DIAGNOSIS — I2510 Atherosclerotic heart disease of native coronary artery without angina pectoris: Secondary | ICD-10-CM | POA: Diagnosis not present

## 2021-08-23 DIAGNOSIS — Z8711 Personal history of peptic ulcer disease: Secondary | ICD-10-CM | POA: Insufficient documentation

## 2021-08-23 DIAGNOSIS — I130 Hypertensive heart and chronic kidney disease with heart failure and stage 1 through stage 4 chronic kidney disease, or unspecified chronic kidney disease: Secondary | ICD-10-CM | POA: Diagnosis not present

## 2021-08-23 DIAGNOSIS — Z8249 Family history of ischemic heart disease and other diseases of the circulatory system: Secondary | ICD-10-CM | POA: Diagnosis not present

## 2021-08-23 DIAGNOSIS — R251 Tremor, unspecified: Secondary | ICD-10-CM | POA: Diagnosis not present

## 2021-08-23 DIAGNOSIS — S22080A Wedge compression fracture of T11-T12 vertebra, initial encounter for closed fracture: Secondary | ICD-10-CM | POA: Insufficient documentation

## 2021-08-23 DIAGNOSIS — Z87891 Personal history of nicotine dependence: Secondary | ICD-10-CM | POA: Diagnosis not present

## 2021-08-23 DIAGNOSIS — I340 Nonrheumatic mitral (valve) insufficiency: Secondary | ICD-10-CM | POA: Diagnosis not present

## 2021-08-23 DIAGNOSIS — Z8673 Personal history of transient ischemic attack (TIA), and cerebral infarction without residual deficits: Secondary | ICD-10-CM | POA: Insufficient documentation

## 2021-08-23 DIAGNOSIS — Z79899 Other long term (current) drug therapy: Secondary | ICD-10-CM | POA: Insufficient documentation

## 2021-08-23 DIAGNOSIS — E785 Hyperlipidemia, unspecified: Secondary | ICD-10-CM | POA: Diagnosis not present

## 2021-08-23 DIAGNOSIS — Z9981 Dependence on supplemental oxygen: Secondary | ICD-10-CM | POA: Diagnosis not present

## 2021-08-23 DIAGNOSIS — Z90710 Acquired absence of both cervix and uterus: Secondary | ICD-10-CM | POA: Insufficient documentation

## 2021-08-23 DIAGNOSIS — Z9049 Acquired absence of other specified parts of digestive tract: Secondary | ICD-10-CM | POA: Diagnosis not present

## 2021-08-23 DIAGNOSIS — Z885 Allergy status to narcotic agent status: Secondary | ICD-10-CM | POA: Diagnosis not present

## 2021-08-23 DIAGNOSIS — X58XXXA Exposure to other specified factors, initial encounter: Secondary | ICD-10-CM | POA: Diagnosis not present

## 2021-08-23 DIAGNOSIS — Z8616 Personal history of COVID-19: Secondary | ICD-10-CM | POA: Insufficient documentation

## 2021-08-23 DIAGNOSIS — Z7689 Persons encountering health services in other specified circumstances: Secondary | ICD-10-CM | POA: Diagnosis not present

## 2021-08-23 DIAGNOSIS — J449 Chronic obstructive pulmonary disease, unspecified: Secondary | ICD-10-CM | POA: Insufficient documentation

## 2021-08-23 DIAGNOSIS — I5032 Chronic diastolic (congestive) heart failure: Secondary | ICD-10-CM | POA: Diagnosis not present

## 2021-08-23 DIAGNOSIS — Z888 Allergy status to other drugs, medicaments and biological substances status: Secondary | ICD-10-CM | POA: Diagnosis not present

## 2021-08-23 DIAGNOSIS — Z9889 Other specified postprocedural states: Secondary | ICD-10-CM | POA: Diagnosis not present

## 2021-08-23 DIAGNOSIS — R69 Illness, unspecified: Secondary | ICD-10-CM | POA: Diagnosis not present

## 2021-08-23 HISTORY — PX: KYPHOPLASTY: SHX5884

## 2021-08-23 SURGERY — KYPHOPLASTY
Anesthesia: General | Site: Thoracic

## 2021-08-23 MED ORDER — LACTATED RINGERS IV SOLN: INTRAVENOUS | Status: DC

## 2021-08-23 MED ORDER — OXYCODONE HCL 5 MG PO TABS
ORAL_TABLET | ORAL | Status: AC
  Administered 2021-08-23: 5 mg via ORAL
  Filled 2021-08-23: qty 1

## 2021-08-23 MED ORDER — CEFAZOLIN SODIUM-DEXTROSE 2-4 GM/100ML-% IV SOLN
2.0000 g | INTRAVENOUS | Status: AC
  Administered 2021-08-23: 2 g via INTRAVENOUS

## 2021-08-23 MED ORDER — ORAL CARE MOUTH RINSE: 15.0000 mL | Freq: Once | OROMUCOSAL | Status: AC

## 2021-08-23 MED ORDER — LIDOCAINE HCL 1 % IJ SOLN
INTRAMUSCULAR | Status: DC | PRN
  Administered 2021-08-23: 10 mL

## 2021-08-23 MED ORDER — CEFAZOLIN SODIUM-DEXTROSE 2-4 GM/100ML-% IV SOLN
INTRAVENOUS | Status: AC
  Filled 2021-08-23: qty 100

## 2021-08-23 MED ORDER — FENTANYL CITRATE (PF) 100 MCG/2ML IJ SOLN
INTRAMUSCULAR | Status: DC | PRN
  Administered 2021-08-23: 25 ug via INTRAVENOUS
  Administered 2021-08-23: 50 ug via INTRAVENOUS

## 2021-08-23 MED ORDER — MIDAZOLAM HCL 2 MG/2ML IJ SOLN
INTRAMUSCULAR | Status: AC
  Filled 2021-08-23: qty 2

## 2021-08-23 MED ORDER — LIDOCAINE HCL (PF) 1 % IJ SOLN
INTRAMUSCULAR | Status: AC
  Filled 2021-08-23: qty 60

## 2021-08-23 MED ORDER — KETAMINE HCL 10 MG/ML IJ SOLN
INTRAMUSCULAR | Status: DC | PRN
  Administered 2021-08-23: 20 mg via INTRAVENOUS
  Administered 2021-08-23: 10 mg via INTRAVENOUS

## 2021-08-23 MED ORDER — ONDANSETRON HCL 4 MG PO TABS: 4.0000 mg | ORAL_TABLET | Freq: Four times a day (QID) | ORAL | Status: DC | PRN

## 2021-08-23 MED ORDER — KETAMINE HCL 50 MG/5ML IJ SOSY
PREFILLED_SYRINGE | INTRAMUSCULAR | Status: AC
  Filled 2021-08-23: qty 5

## 2021-08-23 MED ORDER — SODIUM CHLORIDE FLUSH 0.9 % IV SOLN
INTRAVENOUS | Status: AC
  Filled 2021-08-23: qty 10

## 2021-08-23 MED ORDER — ONDANSETRON HCL 4 MG/2ML IJ SOLN: 4.0000 mg | Freq: Once | INTRAMUSCULAR | Status: DC | PRN

## 2021-08-23 MED ORDER — LIDOCAINE HCL 1 % IJ SOLN
INTRAMUSCULAR | Status: DC | PRN
  Administered 2021-08-23: 5 mL via INTRAMUSCULAR

## 2021-08-23 MED ORDER — FENTANYL CITRATE (PF) 100 MCG/2ML IJ SOLN
INTRAMUSCULAR | Status: AC
  Filled 2021-08-23: qty 2

## 2021-08-23 MED ORDER — PROPOFOL 10 MG/ML IV BOLUS
INTRAVENOUS | Status: AC
  Filled 2021-08-23: qty 20

## 2021-08-23 MED ORDER — SODIUM CHLORIDE 0.9 % IV SOLN: INTRAVENOUS | Status: DC

## 2021-08-23 MED ORDER — MIDAZOLAM HCL 2 MG/2ML IJ SOLN
INTRAMUSCULAR | Status: DC | PRN
  Administered 2021-08-23: 2 mg via INTRAVENOUS

## 2021-08-23 MED ORDER — PROPOFOL 500 MG/50ML IV EMUL
INTRAVENOUS | Status: DC | PRN
  Administered 2021-08-23: 50 ug/kg/min via INTRAVENOUS

## 2021-08-23 MED ORDER — IOHEXOL 180 MG/ML  SOLN
INTRAMUSCULAR | Status: DC | PRN
  Administered 2021-08-23: 10 mL

## 2021-08-23 MED ORDER — BUPIVACAINE-EPINEPHRINE (PF) 0.5% -1:200000 IJ SOLN
INTRAMUSCULAR | Status: AC
  Filled 2021-08-23: qty 30

## 2021-08-23 MED ORDER — ONDANSETRON HCL 4 MG/2ML IJ SOLN: 4.0000 mg | Freq: Four times a day (QID) | INTRAMUSCULAR | Status: DC | PRN

## 2021-08-23 MED ORDER — CHLORHEXIDINE GLUCONATE 0.12 % MT SOLN: 15.0000 mL | Freq: Once | OROMUCOSAL | Status: AC

## 2021-08-23 MED ORDER — CHLORHEXIDINE GLUCONATE 0.12 % MT SOLN
OROMUCOSAL | Status: AC
  Administered 2021-08-23: 15 mL via OROMUCOSAL
  Filled 2021-08-23: qty 15

## 2021-08-23 MED ORDER — OXYCODONE HCL 5 MG PO TABS: 5.0000 mg | ORAL_TABLET | Freq: Once | ORAL | Status: AC

## 2021-08-23 SURGICAL SUPPLY — 23 items
CEMENT KYPHON CX01A KIT/MIXER (Cement) ×2 IMPLANT
DERMABOND ADVANCED (GAUZE/BANDAGES/DRESSINGS) ×1
DERMABOND ADVANCED .7 DNX12 (GAUZE/BANDAGES/DRESSINGS) ×1 IMPLANT
DEVICE BIOPSY BONE KYPHX (INSTRUMENTS) ×2 IMPLANT
DRAPE C-ARM XRAY 36X54 (DRAPES) ×2 IMPLANT
DURAPREP 26ML APPLICATOR (WOUND CARE) ×2 IMPLANT
GAUZE 4X4 16PLY ~~LOC~~+RFID DBL (SPONGE) ×2 IMPLANT
GLOVE SURG SYN 9.0  PF PI (GLOVE) ×2
GLOVE SURG SYN 9.0 PF PI (GLOVE) ×1 IMPLANT
GOWN SRG 2XL LVL 4 RGLN SLV (GOWNS) ×1 IMPLANT
GOWN STRL NON-REIN 2XL LVL4 (GOWNS) ×2
GOWN STRL REUS W/ TWL LRG LVL3 (GOWN DISPOSABLE) ×1 IMPLANT
GOWN STRL REUS W/TWL LRG LVL3 (GOWN DISPOSABLE) ×2
MANIFOLD NEPTUNE II (INSTRUMENTS) ×2 IMPLANT
NEEDLE SPNL 20GX3.5 QUINCKE YW (NEEDLE) ×2 IMPLANT
PACK KYPHOPLASTY (MISCELLANEOUS) ×2 IMPLANT
RENTAL RFA GENERATOR (MISCELLANEOUS) IMPLANT
STRAP SAFETY 5IN WIDE (MISCELLANEOUS) ×2 IMPLANT
SWABSTK COMLB BENZOIN TINCTURE (MISCELLANEOUS) ×2 IMPLANT
TRAY KYPHOPAK 15/2 EXPRESS (KITS) ×2 IMPLANT
TRAY KYPHOPAK 15/3 EXPRESS 1ST (MISCELLANEOUS) IMPLANT
TRAY KYPHOPAK 20/3 EXPRESS 1ST (MISCELLANEOUS) IMPLANT
WATER STERILE IRR 500ML POUR (IV SOLUTION) ×2 IMPLANT

## 2021-08-23 NOTE — Discharge Instructions (Addendum)
Remove Band-Aid on Friday then okay to shower Take it easy today and try to resume walking is much as you can starting tomorrow Call office if you are having problems Pain medicine as previously [place]   The drugs that you were given will stay in your system until tomorrow so for the next 24 hours you should not:  Drive an automobile Make any legal decisions Drink any alcoholic beverage   You may resume regular meals tomorrow.  Today it is better to start with liquids and gradually work up to solid foods.  You may eat anything you prefer, but it is better to start with liquids, then soup and crackers, and gradually work up to solid foods.   Please notify your doctor immediately if you have any unusual bleeding, trouble breathing, redness and pain at the surgery site, drainage, fever, or pain not relieved by medication.    Additional Instructions:        Please contact your physician with any problems or Same Day Surgery at [phone], Monday through Friday 6 am to 4 pm, or [place] at [facility] number at [phone].

## 2021-08-23 NOTE — Anesthesia Preprocedure Evaluation (Addendum)
Anesthesia Evaluation  Patient identified by MRN, date of birth, ID band Patient awake    Reviewed: Allergy & Precautions, NPO status , Patient's Chart, lab work & pertinent test results  History of Anesthesia Complications Negative for: history of anesthetic complications  Airway Mallampati: II  TM Distance: >3 FB Neck ROM: Full    Dental  (+) Upper Dentures, Lower Dentures   Pulmonary neg sleep apnea, COPD,  COPD inhaler and oxygen dependent, Patient abstained from smoking.Not current smoker, former smoker,  3L/min O2 by nasal cannula at night   Pulmonary exam normal breath sounds clear to auscultation       Cardiovascular Exercise Tolerance: Poor METShypertension, +CHF  (-) CAD and (-) Past MI (-) dysrhythmias  Rhythm:Regular Rate:Normal - Systolic murmurs TTE [id number] relatively unremarkable   Neuro/Psych PSYCHIATRIC DISORDERS Anxiety Depression CVA, No Residual Symptoms    GI/Hepatic PUD, GERD  Controlled,(+)     (-) substance abuse  ,   Endo/Other  neg diabetes  Renal/GU CRFRenal disease     Musculoskeletal  (+) Arthritis ,   Abdominal   Peds  Hematology  (+) anemia ,   Anesthesia Other Findings Allergy Anemia Anxiety Arthritis CHF (congestive heart failure) ([place]) 08/23/18  Left ventricle: The cavity size was normal. Wall thickness wasnormal. Systolic function was normal. The estimated ejectionfraction was in the range of 55% to 65%.  - Mitral valve: There was mild regurgitation GERD (gastroesophageal reflux disease) Hypertension MVP (mitral valve prolapse)  Reproductive/Obstetrics                            Anesthesia Physical  Anesthesia Plan  ASA: 3  Anesthesia Plan: General   Post-op Pain Management:    Induction: Intravenous  PONV Risk Score and Plan: 3 and Treatment may vary due to age or medical condition and Propofol infusion  Airway Management Planned: Nasal  Cannula and Natural Airway  Additional Equipment: None  Intra-op Plan:   Post-operative Plan:   Informed Consent: I have reviewed the patients History and Physical, chart, labs and discussed the procedure including the risks, benefits and alternatives for the proposed anesthesia with the patient or authorized representative who has indicated his/her understanding and acceptance.     Dental advisory given  Plan Discussed with: CRNA, Surgeon and Anesthesiologist  Anesthesia Plan Comments: (Discussed risks of anesthesia with patient, including possibility of difficulty with spontaneous ventilation under anesthesia necessitating airway intervention, PONV, and rare risks such as cardiac or respiratory or neurological events, and allergic reactions. Patient understands.)       Anesthesia Quick Evaluation

## 2021-08-23 NOTE — Telephone Encounter (Signed)
Noted. Looks like she's undergoing kyphoplasty.  Will relay message to Dr. Einar Pheasant, she will review upon her return next week.

## 2021-08-23 NOTE — Op Note (Signed)
08/23/2021  4:31 PM  PATIENT:  Morgan [name]   MRN: [id number]   PRE-OPERATIVE DIAGNOSIS:  closed wedge compression fracture of T9   POST-OPERATIVE DIAGNOSIS:  closed wedge compression fracture of T9   PROCEDURE:  Procedure(s): KYPHOPLASTY T9  SURGEON: Laurene Footman, MD   ASSISTANTS: None   ANESTHESIA:   local and MAC   EBL:  No intake/output data recorded.   BLOOD ADMINISTERED:none   DRAINS: none    LOCAL MEDICATIONS USED:  MARCAINE    and XYLOCAINE    SPECIMEN:  T9 vertebral body biopsy   DISPOSITION OF SPECIMEN: Pathology   COUNTS:  YES   TOURNIQUET:  * No tourniquets in log *   IMPLANTS: Bone cement   DICTATION: .Dragon Dictation  patient was brought to the operating room and after adequate anesthesia was obtained the patient was placed prone.  C arm was brought in in good visualization of the affected level obtained on both AP and lateral projections.  After patient identification and timeout procedures were completed, local anesthetic was infiltrated with 10 cc 1% Xylocaine infiltrated subcutaneously.  This is done the area on the each side of the planned approach.  The back was then prepped and draped in the usual sterile manner and repeat timeout procedure carried out.  A spinal needle was brought down to the pedicle on the right side of T9 and a 50-50 mix of 1% Xylocaine half percent Sensorcaine with epinephrine total of 20 cc injected on the right side.  After allowing this to set a small incision was made and the trocar was advanced into the vertebral body in an extrapedicular fashion.  Biopsy was obtained drilling was carried out balloon inserted with inflation to 2.0 cc on the right and across the midline so second stick was not required on the left side.  When the cement was appropriate consistency for cc were injected on the right into the vertebral body with some extravasation into the T9-10 disc space, good fill superior to inferior endplates and from right to  left sides along the inferior endplate.  After the cement had set the trochar was removed and permanent C-arm views obtained.  The wound was closed with Dermabond followed by Band-Aid   PLAN OF CARE: Discharge home after recovery room   PATIENT DISPOSITION:  PACU - hemodynamically stable.

## 2021-08-23 NOTE — Anesthesia Postprocedure Evaluation (Signed)
Anesthesia Post Note  Patient: Morgan [name]  Procedure(s) Performed: T9 KYPHOPLASTY (Thoracic)  Patient location during evaluation: PACU Anesthesia Type: General Level of consciousness: awake and alert Pain management: pain level controlled Vital Signs Assessment: post-procedure vital signs reviewed and stable Respiratory status: spontaneous breathing, nonlabored ventilation, respiratory function stable and patient connected to nasal cannula oxygen Cardiovascular status: blood pressure returned to baseline and stable Postop Assessment: no apparent nausea or vomiting Anesthetic complications: no   No notable events documented.   Last Vitals:  Vitals:   08/23/21 1645 08/23/21 1653  BP: 126/61   Pulse: 73 80  Resp: 17 16  Temp:  (!) 36.2 C  SpO2: 100% 100%    Last Pain:  Vitals:   08/23/21 1653  TempSrc:   PainSc: 0-No pain                 Arita Miss

## 2021-08-23 NOTE — Telephone Encounter (Signed)
Morgan [name] called in and wanted to tell Dr. Einar Pheasant that she is having back surgery today

## 2021-08-23 NOTE — Transfer of Care (Signed)
Immediate Anesthesia Transfer of Care Note  Patient: Morgan [name]  Procedure(s) Performed: T9 KYPHOPLASTY (Thoracic)  Patient Location: PACU  Anesthesia Type:General  Level of Consciousness: drowsy and patient cooperative  Airway & Oxygen Therapy: Patient Spontanous Breathing and Patient connected to nasal cannula oxygen  Post-op Assessment: Report given to RN and Post -op Vital signs reviewed and stable  Post vital signs: Reviewed and stable  Last Vitals:  Vitals Value Taken Time  BP 118/73 08/23/21 1623  Temp 36.2 C 08/23/21 1623  Pulse 78 08/23/21 1628  Resp 19 08/23/21 1628  SpO2 100 % 08/23/21 1628  Vitals shown include unvalidated device data.  Last Pain:  Vitals:   08/23/21 1623  TempSrc:   PainSc: 0-No pain         Complications: No notable events documented.

## 2021-08-23 NOTE — H&P (Signed)
Chief Complaint  Patient presents with   Middle Back - Post Operative Visit    History of the Present Illness: Morgan [name] is a 70 y.o. female here today.   The patient presents for follow-up evaluation status post kyphoplasty at T10 on 07/20/2021. She re-injured and is having severe pain. X-ray obtained today.  The patient locates her pain to the inferior aspect of her prior kyphoplasty level. She states she lifted some books and felt a twinge of pain on Friday, 07/28/2021, and she sat down and it was okay. She states there was a piece of paper on the floor and she bent over to get it, and she could not move. She took her last oxycodone yesterday.  I have reviewed past medical, surgical, social and family history, and allergies as documented in the EMR.  Past Medical History: Past Medical History:  Diagnosis Date   Acute midline thoracic back pain 06/26/2021  Last Assessment & Plan: Formatting of this note might be different from the original. From fall with point tenderness. XR T-spine reassuring - will follow-up final read. Oxycodone for severe pain. Advised limiting dosage. Will also plan for referral due to recurrent severe pain response.   Acute pain of right knee 12/29/2019  Last Assessment & Plan: Formatting of this note might be different from the original. Pt with hx of meniscus tear requiring surgery now with acute knee pain unable to ambulate. No specific injury so did not get XR today, but given surgical hx referral to ortho for further evaluation and treatment.   Anxiety 08/26/2017  Last Assessment & Plan: Formatting of this note might be different from the original. Follows with [facility] - on fluoxetine 40 mg and abilify. Worsening symptoms - she will call [facility] to discuss and reach out to therapists. 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 UA with leukocytosis and nitrites, but no symptoms currently. Hx of this in the hospital stay. Will repeat culture today, no symptoms currently. Unclear if related to fall.   Chest pressure 02/20/2021  Last Assessment & Plan: Formatting of this note might be different from the original. Pt with 1 day of chest pressure and orthopnea. Attempted to get EKG in the office but pt unable to lay flat. Discussed concern for possible cardiac cause and recommend ER for evaluation to rule out heart attack. Given orthopnea suspect CHF exacerbation though wheezes on exam but w/o complaining of worsening COPD   CHF (congestive heart failure) (CMS-HCC)   Chronic cholecystitis 05/19/2020   Chronic diastolic CHF (congestive heart failure) (CMS-HCC) 08/09/2020  Last Assessment & Plan: Formatting of this note might be different from the original. Stable edema and no crackles. Cont current management   Chronic fatigue 06/13/2018   Chronic respiratory failure with hypoxia (CMS-HCC) 08/09/2020  Last Assessment & Plan: Formatting of this note might be different from the original. Breathing well on RA. Lungs clear. Continue home management   CKD (chronic kidney disease) stage 3, GFR 30-59 ml/min (CMS-HCC) 07/22/2016  Last Assessment & Plan: Formatting of this note might be different from the original. Cr stable   COPD (chronic obstructive pulmonary disease) (CMS-HCC) 01/14/2018  Last Assessment & Plan: Formatting of this note might be different from the original. Good air movement, no  wheezes. Endorses sob with activity and fatigue but low suspicion for COPD exacerbation. Suspect is related to pain and post-hospital fatigue.   Coronary artery disease   COVID-19 08/2020   CVA (cerebral vascular accident) (CMS-HCC) 08/22/2018   Depression 07/22/2016  Formatting of this note might be different from the  original. Was on fluoxetine and benztropine   Depression (emotion)   Diarrhea 02/22/2015  Last Assessment & Plan: Formatting of this note might be different from the original. Pt with new onset diarrhea in setting of antibiotic treatment. She has had some recent hospitalizations and abdominal pain worsening with hypoactive bowel sounds. Will test Cdiff to rule out, though may be an antibiotic side effect. Encouraged hydration   Diminished pulses in lower extremity 07/22/2016   Dizziness 01/13/2019  Last Assessment & Plan: Formatting of this note might be different from the original. Symptoms unclear. It could be vertigo based on room spinning vs orthostatic given lasix. However, description more consistent with vertigo. Short lasting symptoms - hand out provided and pt already with plan to see ENT - recommended mention during that visit.   Dysphagia 11/12/2018  Last Assessment & Plan: Formatting of this note might be different from the original. Notes hx of esophageal strictures and needing them dilated and now with 6 months of progressive swallowing difficulty with saliva. Is able to swallow food/water w/o difficulty. GI referral   Essential hypertension 07/22/2016  Last Assessment & Plan: Formatting of this note might be different from the original. Elevated likely 2/2 to pain. Follows with cardiology. Cont coreg and telmisartan   Fall 04/08/2019  Last Assessment & Plan: Formatting of this note might be different from the original. Seems most consistent with orthostatic syncope possibly based on middle of the night and getting up. Will see if home health can obtain orthostatic vital signs today. Advised slow transitions. If persisting or recurrent will need follow-up appointment and possibly cardiology appointment   Former tobacco use 07/22/2016   Gastroesophageal reflux disease 10/04/2017   Hoarseness 11/12/2018  Last Assessment & Plan: Formatting of this note might be different from the original.  Persisting beyond CVA. 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 Likely some of it is secondary to her chronic kidney disease. We did discuss potential referral to hematology if this continues as well.   Left-sided weakness 10/04/2017   Non-rheumatic mitral regurgitation 11/21/2017  Mod on 10/2017 cMRI. Mild by 2017 ECHO. 05/27/18: MRI mild to moderate MR   Occipital neuralgia of right side 10/13/2017   Other chronic pain 06/26/2021  Last Assessment & Plan: Formatting of this note might be different from the original. Patient has had this left upper quadrant chronic abdominal pain for quite some time at this point. Patient has many different possibilities including congestive heart failure, ascending aortic aneurysm, chronic respiratory difficulties, chronic kidney disease and a history of CVA. These could all in their own   Oxygen dependent 03/30/2020  Formatting of this note might be different from the original. At bedtime   Pain of lumbar spine 12/30/2019   Peptic ulcer disease 03/22/2020  Last Assessment & Plan: Formatting of this note might be different from the original. Continues to have daily nausea. Refill provided. Advised GI f/u - appreciate support Last Assessment & Plan: Formatting of this note might be  different from the original. Continue sucralfate and omeprazole. Notes worsening abdominal pain w/ eating and unclear if heartburn related. 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 With nausea, vomiting resolved. Trial of liquid diet. Has had 2 dilations and reports another planned at next GI appt. Advised  reaching out to GI given persistence of symptoms (nausea/abdominal pain). Advised full liquid diet for a few days to see if symptoms improve and slow advancement. Difficult to determine   Pylorus ulcer 07/05/2021   Recurrent major depressive disorder, in partial remission (CMS-HCC) 10/13/2017   Shortness of breath 01/13/2018   Sleepiness 11/12/2018  Last Assessment & Plan: Formatting of this note might be different from the original. Sleepiness + some confusion in setting of recent seizure diagnosis concerning for possible post-ictal state, though no seizure episodes. Will get some labs today, but also advised reaching out to Neurologist   Stroke (CMS-HCC)   Tremor   Viral gastroenteritis 02/19/2019   Past Surgical History: Past Surgical History:  Procedure Laterality Date   CARDIAC CATHETERIZATION   CHOLECYSTECTOMY  dr Hassell Done @ [place] [facility]   esopaghus stretch 2006  @ [facility]   HYSTERECTOMY N/A 2002  in [place] is all the patient can remember   KNEE ARTHROSCOPY Left 2020  dr Theda Sers @ [facility]   KNEE ARTHROSCOPY Right 2018  dr Theda Sers @ [facility]   Past Family History: Family History  Problem Relation Age of Onset   Hyperlipidemia (Elevated cholesterol) Father   High blood pressure (Hypertension) Father   Medications: Current Outpatient Medications Ordered in Epic  Medication Sig Dispense Refill   albuterol 90 mcg/actuation inhaler TAKE 2 PUFFS BY MOUTH EVERY 6 HOURS AS NEEDED FOR WHEEZE OR SHORTNESS OF BREATH   atorvastatin (LIPITOR) 40 MG tablet Take 1 tablet (40 mg total) by mouth once daily 90 tablet 3   carvediloL (COREG) 6.25 MG tablet Take 1 tablet (6.25 mg total) by mouth 2 (two) times daily with meals 180 tablet 4   esomeprazole (NEXIUM) 40 MG DR capsule TAKE 1 CAPSULE (40 MG TOTAL) BY MOUTH DAILY AT 12 NOON.   FLUoxetine (PROZAC) 40 MG capsule Take 40 mg by mouth once daily   FUROsemide (LASIX) 40 MG tablet TAKE 1 TABLET BY MOUTH EVERY DAY  90 tablet 3   gabapentin (NEURONTIN) 400 MG capsule (pt takes 3 tabs, 1200 mg at night)   hydrOXYzine (ATARAX) 25 MG tablet   ibuprofen (MOTRIN) 600 MG tablet Take 600 mg by mouth every 6 (six) hours as needed   linaCLOtide (LINZESS) 145 mcg capsule Take 145 mcg by mouth as needed (constipation)   meloxicam (MOBIC) 7.5 MG tablet Take 1 tablet by mouth once daily   mirtazapine (REMERON) 45 MG tablet Take 45 mg by mouth nightly   omeprazole (PRILOSEC) 40 MG DR capsule Take 40 mg by mouth once daily   ondansetron (ZOFRAN-ODT) 4 MG disintegrating tablet Take 4 mg by mouth every 8 (eight) hours as needed   oxyCODONE (ROXICODONE) 5 MG immediate release tablet Take 1 tablet (5 mg total) by mouth every 6 (six) hours as needed 35 tablet 0   tiZANidine (ZANAFLEX) 4 MG tablet TAKE 1 TABLET (4 MG TOTAL) BY MOUTH EVERY 8 (EIGHT) HOURS AS NEEDED FOR MUSCLE SPASMS   valsartan (DIOVAN) 320 MG  tablet Take 1 tablet (320 mg total) by mouth once daily 90 tablet 4   No current Epic-ordered facility-administered medications on file.   Allergies: Allergies  Allergen Reactions   Meperidine Hives and Anaphylaxis   Strawberry Hives, Angioedema and Swelling   Sucralfate Nausea and Other (See Comments)  Severe nausea   Venom-Wasp Hives   Wasp Venom Hives    Body mass index is 31.82 kg/m.  Review of Systems: A comprehensive 14 point ROS was performed, reviewed, and the pertinent orthopaedic findings are documented in the HPI.  Vitals:  07/31/21 0951  BP: 126/78    General Physical Examination:   General/Constitutional: No apparent distress: well-nourished and well developed. Eyes: Pupils equal, round with synchronous movement. Lungs:  Clear to auscultation HEENT:  Normal Vascular: No edema, swelling or tenderness, except as noted in detailed exam. Cardiac: Heart rate and rhythm is regular. Integumentary: No impressive skin lesions present, except as noted in detailed exam. Neuro/Psych: Normal mood  and affect, oriented to person, place and time.  Musculoskeletal Examination:   On exam, tenderness just below the prior kyphoplasty level.  Radiographs:  AP and lateral x-rays of the thoracolumbar spine were ordered and personally reviewed today. These show prior kyphoplasty level at T10. It does look like there may be a new compression deformity at T9 when looking at her prior MRI in the thoracic spine, there were no other prior fractures. T9 looks compressed now along with some inferior superior endplate deformity at 624THL and T12.  X-ray Impression Possible new fractures at T9, T11 and T12.  Assessment: ICD-10-CM  1. Closed wedge compression fracture of T10 vertebra, initial encounter (CMS-HCC) S22.070A  2. Closed wedge compression fracture of T11 vertebra, initial encounter (CMS-HCC) S22.080A   Plan:  The patient has clinical findings of possible T11 and new fractures at T9, T11, and T12.  We discussed the patient's x-ray findings. I explained she may have a new fracture on her x-ray. I recommend she give it 1 week to discuss more surgery. I refilled her oxycodone. She will call back in 1 week if she is no better, and we will order an MRI of the thoracic spine. Hopefully, this will improve on its own. She will avoid lifting and bending this week.  The patient will follow up as needed.   Attestation: I, Dawn Royse, am documenting for [employer], MD utilizing [place].    Electronically signed by [name], MD at 07/31/2021 8:51 PM EDT  MRI shows new T 9 fracture with extensive edema Reviewed  H+P. No changes noted.

## 2021-08-24 ENCOUNTER — Encounter: Payer: Self-pay | Admitting: Orthopedic Surgery

## 2021-08-25 DIAGNOSIS — I509 Heart failure, unspecified: Secondary | ICD-10-CM | POA: Diagnosis not present

## 2021-08-25 DIAGNOSIS — J449 Chronic obstructive pulmonary disease, unspecified: Secondary | ICD-10-CM | POA: Diagnosis not present

## 2021-08-27 IMAGING — US US ABDOMEN LIMITED
1 series · 14 of 25 positions shown · non-contrast
Comparison: [DATE] [DATE], [DATE].  [DATE] [DATE], [DATE].

CLINICAL DATA: Right upper quadrant abdominal pain.

EXAM:
ULTRASOUND ABDOMEN LIMITED RIGHT UPPER QUADRANT

[Series 1: us abdomen limited · 14 of 67 slices shown]
[im 1/67]
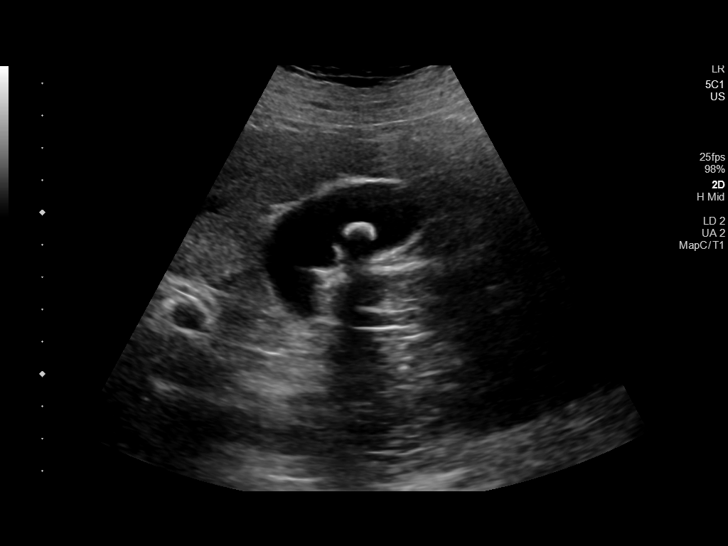
[im 6/67]
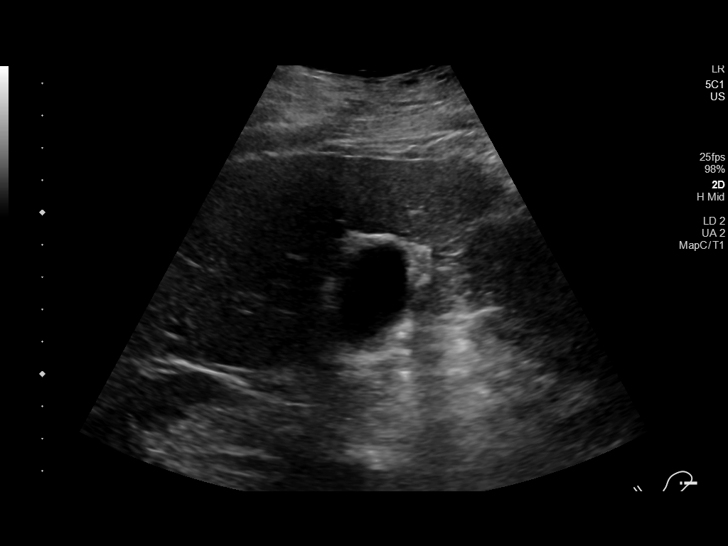
[im 12/67]
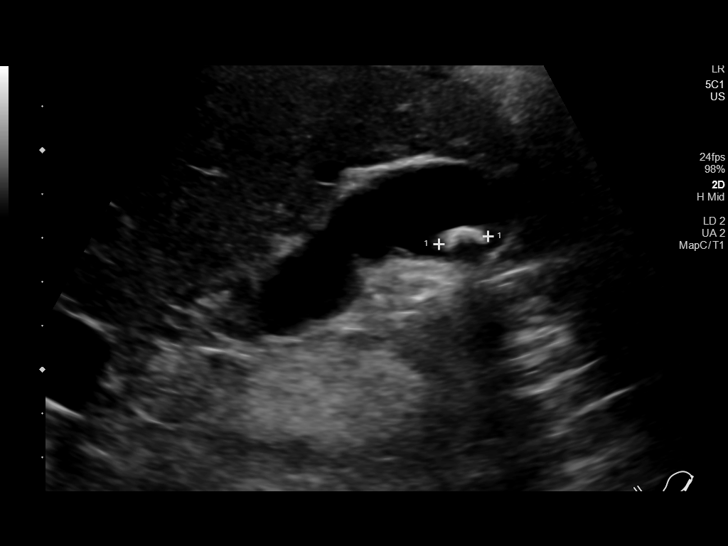
[im 17/67]
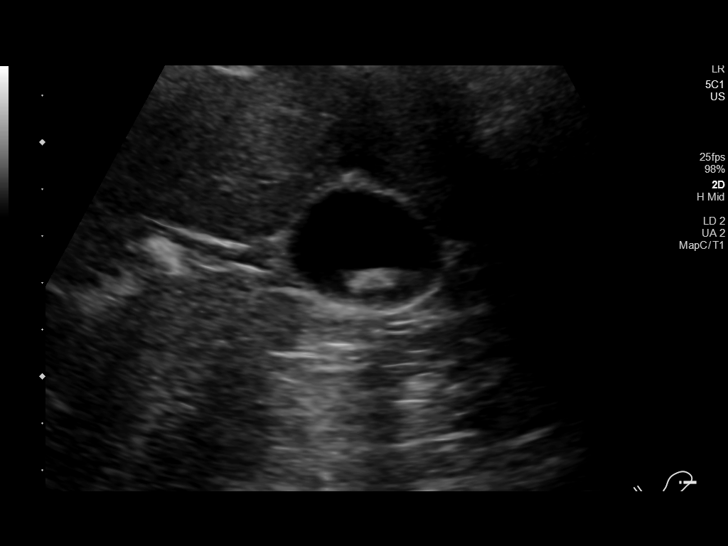
[im 23/67]
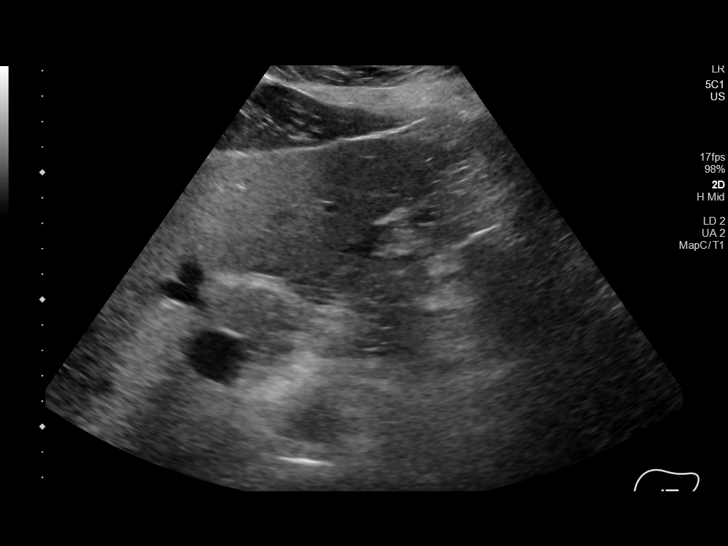
[im 25/67]
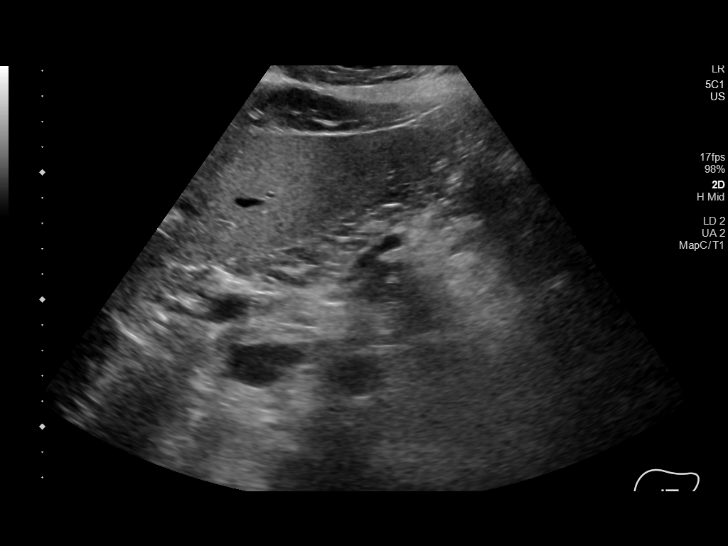
[im 31/67]
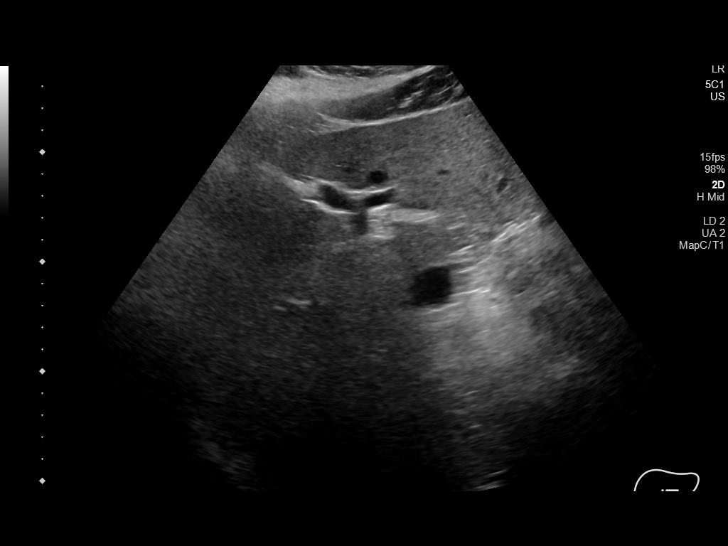
[im 36/67]
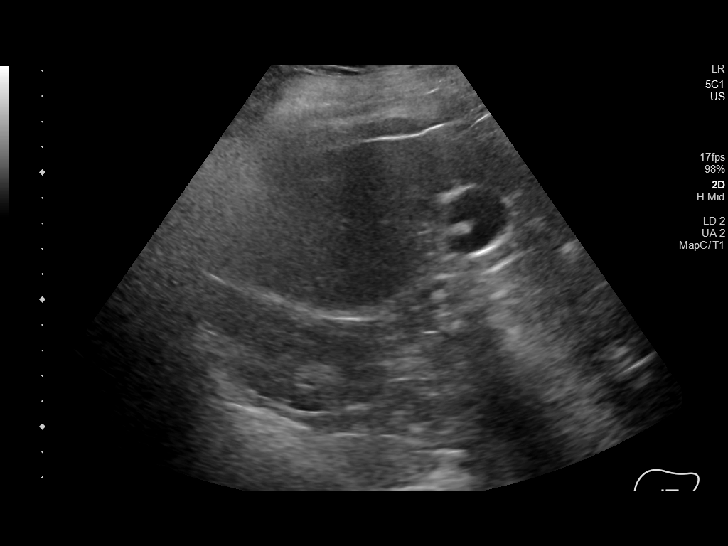
[im 42/67]
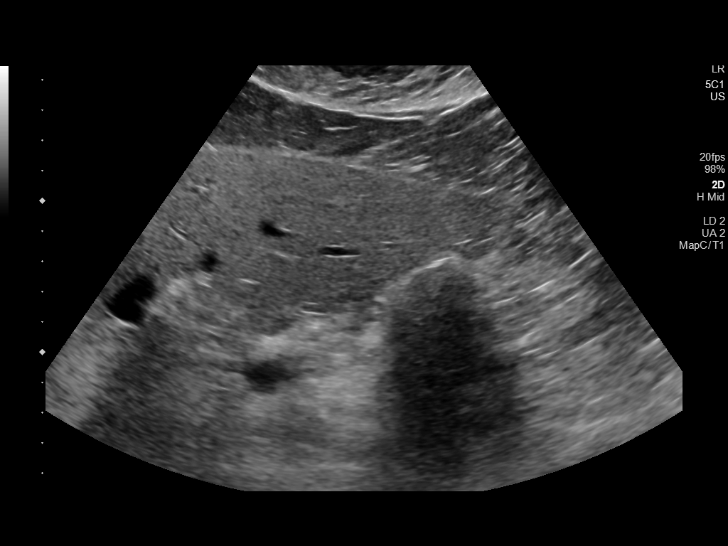
[im 45/67]
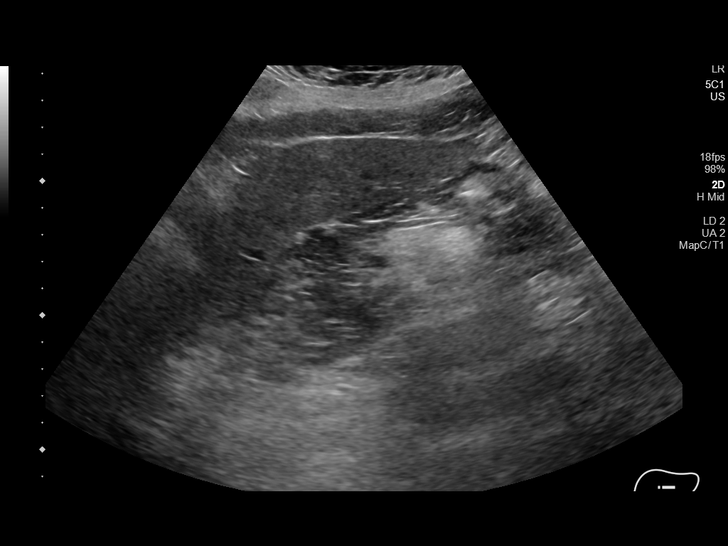
[im 50/67]
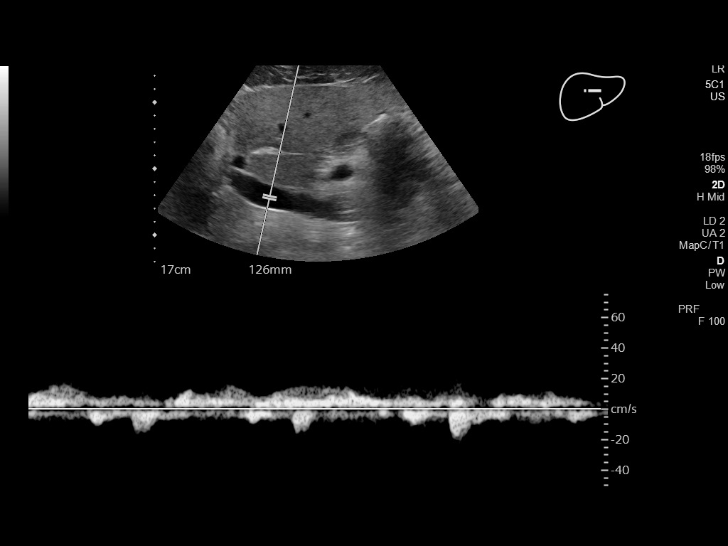
[im 56/67]
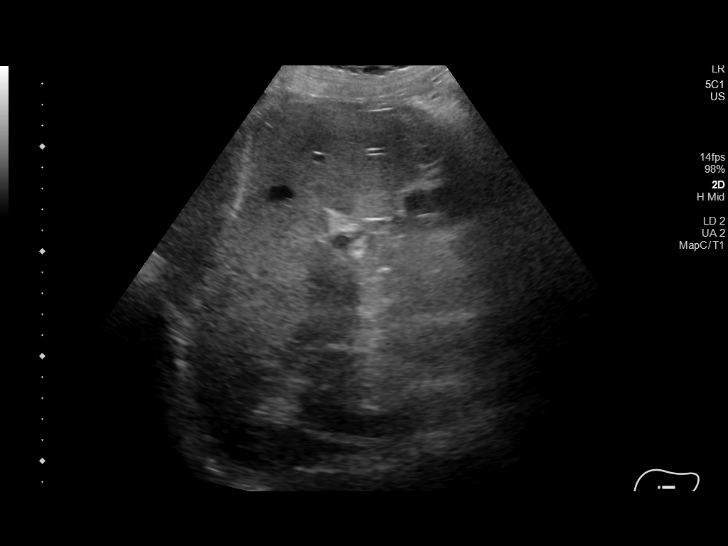
[im 61/67]
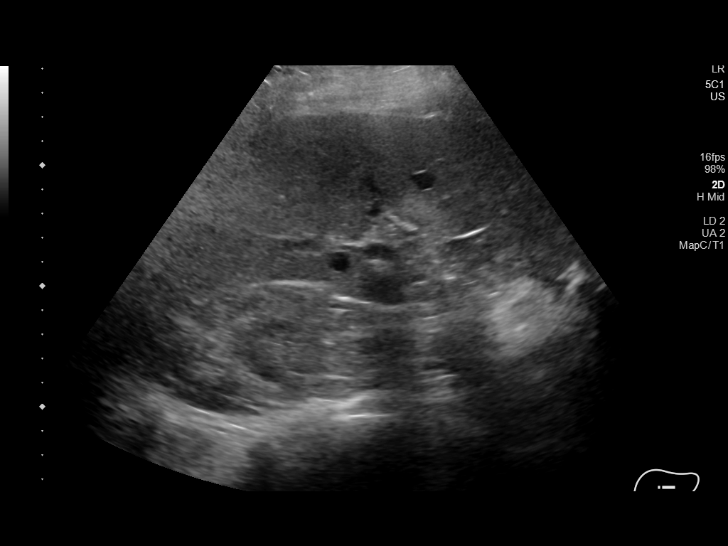
[im 67/67]
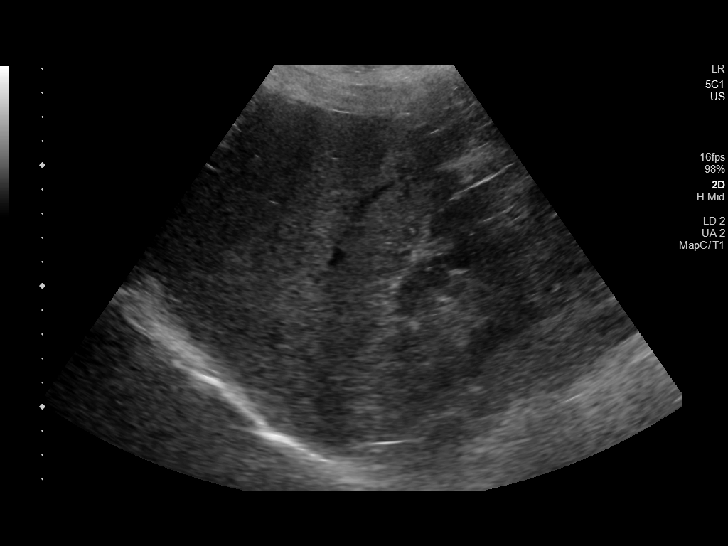

[14 of 25 positions shown; findings below may reference images not displayed]

FINDINGS: Gallbladder:

Solitary gallstone measuring 1.1 cm is noted. No gallbladder wall
thickening or pericholecystic fluid is noted. No sonographic
Murphy's sign is noted.

Common bile duct:

Diameter: 6 mm which is within normal limits.

Liver:

No focal lesion identified. Within normal limits in parenchymal
echogenicity. Portal vein is patent on color Doppler imaging with
normal direction of blood flow towards the liver.

Other: None.
IMPRESSION: Large solitary gallstone is noted without cholecystitis. No other
abnormality seen in the right upper quadrant of the abdomen.

## 2021-08-28 ENCOUNTER — Ambulatory Visit (INDEPENDENT_AMBULATORY_CARE_PROVIDER_SITE_OTHER): Payer: Medicare HMO

## 2021-08-28 ENCOUNTER — Encounter: Payer: Self-pay | Admitting: Family Medicine

## 2021-08-28 ENCOUNTER — Ambulatory Visit (INDEPENDENT_AMBULATORY_CARE_PROVIDER_SITE_OTHER): Payer: Medicare HMO | Admitting: Family Medicine

## 2021-08-28 ENCOUNTER — Other Ambulatory Visit: Payer: Self-pay

## 2021-08-28 VITALS — BP 158/84 | HR 75 | Temp 97.0°F | Ht 73.0 in | Wt 238.1 lb

## 2021-08-28 DIAGNOSIS — J449 Chronic obstructive pulmonary disease, unspecified: Secondary | ICD-10-CM | POA: Diagnosis not present

## 2021-08-28 DIAGNOSIS — Z23 Encounter for immunization: Secondary | ICD-10-CM | POA: Diagnosis not present

## 2021-08-28 DIAGNOSIS — E876 Hypokalemia: Secondary | ICD-10-CM

## 2021-08-28 DIAGNOSIS — R7989 Other specified abnormal findings of blood chemistry: Secondary | ICD-10-CM | POA: Diagnosis not present

## 2021-08-28 DIAGNOSIS — R0609 Other forms of dyspnea: Secondary | ICD-10-CM

## 2021-08-28 DIAGNOSIS — R251 Tremor, unspecified: Secondary | ICD-10-CM | POA: Diagnosis not present

## 2021-08-28 DIAGNOSIS — R0602 Shortness of breath: Secondary | ICD-10-CM | POA: Diagnosis not present

## 2021-08-28 DIAGNOSIS — R0600 Dyspnea, unspecified: Secondary | ICD-10-CM | POA: Insufficient documentation

## 2021-08-28 DIAGNOSIS — K311 Adult hypertrophic pyloric stenosis: Secondary | ICD-10-CM

## 2021-08-28 DIAGNOSIS — I509 Heart failure, unspecified: Secondary | ICD-10-CM

## 2021-08-28 DIAGNOSIS — M8580 Other specified disorders of bone density and structure, unspecified site: Secondary | ICD-10-CM | POA: Diagnosis not present

## 2021-08-28 DIAGNOSIS — R079 Chest pain, unspecified: Secondary | ICD-10-CM | POA: Diagnosis not present

## 2021-08-28 LAB — SURGICAL PATHOLOGY

## 2021-08-28 MED ORDER — ALENDRONATE SODIUM 70 MG PO TABS: 70.0000 mg | ORAL_TABLET | ORAL | 11 refills | Status: DC

## 2021-08-28 MED ORDER — POTASSIUM CHLORIDE CRYS ER 20 MEQ PO TBCR
20.0000 meq | EXTENDED_RELEASE_TABLET | Freq: Every day | ORAL | 3 refills | Status: DC
Start: 2021-08-28 — End: 2021-10-07

## 2021-08-28 NOTE — Assessment & Plan Note (Signed)
Noted on prior labs as increasing lasix will add supplement.

## 2021-08-28 NOTE — Assessment & Plan Note (Signed)
Wheezes on exam and poor air movement. If no improvement with increased lasix may consider course of prednisone given poor air movement. Cont prn albuterol and nocturnal O2

## 2021-08-28 NOTE — Assessment & Plan Note (Signed)
Likely 2/2 to CHF and suspect this is linked to her tremor and lightheadedness. Treat CHF and labs to rule out other possible causes.

## 2021-08-28 NOTE — Progress Notes (Signed)
Subjective:     Morgan [name] is a 70 y.o. female presenting for Results (Dexa scan )     HPI  #back surgery - was doing well after kyphoplasty  - but pain flared up when she reached back in the back seat - now having some pain in the rib and gets pain when she sits and puts pressure - not as bad as prior to surgery  #bone density results  #walking - when she stands up  - will have everything turn white and feel like she will pass out - lasts for a few seconds - happens when walking - resolves with holding on to something - occurring 3-4 times a day - checked O2 level and it was good - occurs with SOB as well   #SOB - more prominent lately  - with just walking to the other side of the room  - heart will be pounding - previous to this would walk [employer] w/o issues - cannot lay flat w/o sob - did have some leg swelling but this is better - endorses PND - using lasix 40 mg daily - using 3L O2 - no wheezing or coughing  - has tried albuterol w/o help  #HTN - does not check her bp - has been taking her medications - was recently increased  - is limiting salt - watching what she eats   Review of Systems   Social History   Tobacco Use  Smoking Status Former   Packs/day: 1.00   Years: 15.00   Pack years: 15.00   Types: Cigarettes   Quit date: 09/12/2017   Years since quitting: 3.9  Smokeless Tobacco Never        Objective:    BP Readings from Last 3 Encounters:  08/28/21 (!) 158/84  08/23/21 (!) 161/84  07/20/21 (!) 183/87   Wt Readings from Last 3 Encounters:  08/28/21 238 lb 2 oz (108 kg)  07/20/21 244 lb (110.7 kg)  07/01/21 214 lb (97.1 kg)    BP (!) 158/84   Pulse 75   Temp (!) 97 F (36.1 C) (Temporal)   Ht '6\' 1"'$  (1.854 m)   Wt 238 lb 2 oz (108 kg)   SpO2 98%   BMI 31.42 kg/m    Physical Exam Constitutional:      General: She is not in acute distress.    Appearance: She is well-developed. She is not diaphoretic.  HENT:      Right Ear: External ear normal.     Left Ear: External ear normal.  Eyes:     Conjunctiva/sclera: Conjunctivae normal.  Neck:     Vascular: JVD present.  Cardiovascular:     Rate and Rhythm: Normal rate and regular rhythm.     Heart sounds: No murmur heard. Pulmonary:     Effort: Pulmonary effort is normal. No respiratory distress.     Breath sounds: Decreased air movement present. Decreased breath sounds and wheezing present.  Musculoskeletal:     Cervical back: Neck supple.     Right lower leg: No edema.     Left lower leg: No edema.  Skin:    General: Skin is warm and dry.     Capillary Refill: Capillary refill takes less than 2 seconds.  Neurological:     Mental Status: She is alert. Mental status is at baseline.  Psychiatric:        Mood and Affect: Mood normal.        Behavior: Behavior normal.  CXR: My read - Increased vascular edema. No obvious consolidations. Spine with recent kyphoplasty changes     Assessment & Plan:   Problem List Items Addressed This Visit       Cardiovascular and Mediastinum   CHF (congestive heart failure) ([place]) - Primary    Exacerbation based on symptoms, exam and CXR with increased fluid. Suspect her SOB is 2/2 to CHF. Increase lasix to 40 mg BID. Start potassium 20 meq - may increase if low on labs. Check CMP, BNP      Relevant Orders   DG Chest 2 View   Brain natriuretic peptide     Respiratory   COPD (chronic obstructive pulmonary disease) ([facility])    Wheezes on exam and poor air movement. If no improvement with increased lasix may consider course of prednisone given poor air movement. Cont prn albuterol and nocturnal O2      Relevant Orders   DG Chest 2 View     Digestive   Pyloric stenosis in adult    Pt is concerned this has returned. Advised calling her GI provider. Wait to start fosamax until symptoms stable.         Musculoskeletal and Integument   Osteopenia    C/b recent lumbar fracture. Discussed starting  Fosamax for prevention. Will check Ca and Vit D today. She will wait until after GI f/u given GERD side effects to start.       Relevant Medications   alendronate (FOSAMAX) 70 MG tablet   Other Relevant Orders   Vitamin D, 25-hydroxy     Other   Hypokalemia    Noted on prior labs as increasing lasix will add supplement.       Relevant Medications   potassium chloride SA (KLOR-CON) 20 MEQ tablet   Elevated TSH   Relevant Orders   T4, free   TSH   DOE (dyspnea on exertion)    Likely 2/2 to CHF and suspect this is linked to her tremor and lightheadedness. Treat CHF and labs to rule out other possible causes.       Relevant Orders   DG Chest 2 View   CBC   Comprehensive metabolic panel   Other Visit Diagnoses     Need for influenza vaccination       Relevant Orders   Flu Vaccine QUAD High Dose(Fluad) (Completed)   Tremor       Relevant Orders   Brain natriuretic peptide   CBC   Comprehensive metabolic panel   T4, free   TSH        Return in about 3 days (around 08/31/2021) for call with update.  [name], MD  This visit occurred during the SARS-CoV-2 public health emergency.  Safety protocols were in place, including screening questions prior to the visit, additional usage of staff PPE, and extensive cleaning of exam room while observing appropriate contact time as indicated for disinfecting solutions.

## 2021-08-28 NOTE — Assessment & Plan Note (Signed)
C/b recent lumbar fracture. Discussed starting Fosamax for prevention. Will check Ca and Vit D today. She will wait until after GI f/u given GERD side effects to start.

## 2021-08-28 NOTE — Assessment & Plan Note (Signed)
Pt is concerned this has returned. Advised calling her GI provider. Wait to start fosamax until symptoms stable.

## 2021-08-28 NOTE — Patient Instructions (Addendum)
#  Shortness of breath - increase Lasix to 40 mg twice daily for 5 days - Update on symptoms on Thursday - Increase     Call [facility] about the esophogeal   Osteoporosis increases your risk of having a fracture.   Bisphosphonate therapy is the first line treatment for prevention of fractures.   Alendronate (daily or weekly)   Common Side effects: Heartburn and Acid Reflux symptoms  Less common:  1) Osteonecrosis of the Jaw - presents with jaw pain, swelling, local infection 2) Femur Fracture 3) Ocular Side effects - pain, blurred vision, conjunctivitis  Would recommend starting Fosamax AFTER you follow-up with GI as acid reflux is most common side effects    Would recommend the following - for bone health :   1) 800 units of Vitamin D daily 2) Get 1200 mg of elemental calcium --- this is best from your diet. Try to track how much calcium you get on a typical day. You could find ways to add more (dairy products, leafy greens). Take a supplement for whatever you don't typically get so you reach 1200 mg of calcium.  3) Physical activity (ideally weight bearing) - like walking briskly 30 minutes 5 days a week.

## 2021-08-28 NOTE — Assessment & Plan Note (Signed)
Exacerbation based on symptoms, exam and CXR with increased fluid. Suspect her SOB is 2/2 to CHF. Increase lasix to 40 mg BID. Start potassium 20 meq - may increase if low on labs. Check CMP, BNP

## 2021-08-29 ENCOUNTER — Telehealth: Payer: Self-pay | Admitting: Family Medicine

## 2021-08-29 ENCOUNTER — Other Ambulatory Visit (INDEPENDENT_AMBULATORY_CARE_PROVIDER_SITE_OTHER): Payer: Medicare HMO

## 2021-08-29 ENCOUNTER — Other Ambulatory Visit: Payer: Self-pay

## 2021-08-29 DIAGNOSIS — R251 Tremor, unspecified: Secondary | ICD-10-CM

## 2021-08-29 DIAGNOSIS — I509 Heart failure, unspecified: Secondary | ICD-10-CM

## 2021-08-29 DIAGNOSIS — R0609 Other forms of dyspnea: Secondary | ICD-10-CM

## 2021-08-29 DIAGNOSIS — R7989 Other specified abnormal findings of blood chemistry: Secondary | ICD-10-CM | POA: Diagnosis not present

## 2021-08-29 DIAGNOSIS — M8580 Other specified disorders of bone density and structure, unspecified site: Secondary | ICD-10-CM

## 2021-08-29 DIAGNOSIS — K311 Adult hypertrophic pyloric stenosis: Secondary | ICD-10-CM

## 2021-08-29 DIAGNOSIS — R0600 Dyspnea, unspecified: Secondary | ICD-10-CM | POA: Diagnosis not present

## 2021-08-29 LAB — CBC
HCT: 31.1 % — ABNORMAL LOW (ref 36.0–46.0)
Hemoglobin: 9.9 g/dL — ABNORMAL LOW (ref 12.0–15.0)
MCHC: 31.8 g/dL (ref 30.0–36.0)
MCV: 75.5 fl — ABNORMAL LOW (ref 78.0–100.0)
Platelets: 312 10*3/uL (ref 150.0–400.0)
RBC: 4.13 Mil/uL (ref 3.87–5.11)
RDW: 15.6 % — ABNORMAL HIGH (ref 11.5–15.5)
WBC: 7.7 10*3/uL (ref 4.0–10.5)

## 2021-08-29 LAB — COMPREHENSIVE METABOLIC PANEL
ALT: 9 U/L (ref 0–35)
AST: 15 U/L (ref 0–37)
Albumin: 3.5 g/dL (ref 3.5–5.2)
Alkaline Phosphatase: 152 U/L — ABNORMAL HIGH (ref 39–117)
BUN: 16 mg/dL (ref 6–23)
CO2: 39 mEq/L — ABNORMAL HIGH (ref 19–32)
Calcium: 9 mg/dL (ref 8.4–10.5)
Chloride: 93 mEq/L — ABNORMAL LOW (ref 96–112)
Creatinine, Ser: 1.01 mg/dL (ref 0.40–1.20)
GFR: 57.33 mL/min — ABNORMAL LOW (ref >60.00)
Glucose, Bld: 116 mg/dL — ABNORMAL HIGH (ref 70–99)
Potassium: 3.2 mEq/L — ABNORMAL LOW (ref 3.5–5.1)
Sodium: 141 mEq/L (ref 135–145)
Total Bilirubin: 0.5 mg/dL (ref 0.2–1.2)
Total Protein: 6.5 g/dL (ref 6.0–8.3)

## 2021-08-29 LAB — TSH: TSH: 2.46 u[IU]/mL (ref 0.35–5.50)

## 2021-08-29 LAB — VITAMIN D 25 HYDROXY (VIT D DEFICIENCY, FRACTURES): VITD: 38.85 ng/mL (ref 30.00–100.00)

## 2021-08-29 LAB — T4, FREE: Free T4: 1.01 ng/dL (ref 0.60–1.60)

## 2021-08-29 LAB — BRAIN NATRIURETIC PEPTIDE: Pro B Natriuretic peptide (BNP): 180 pg/mL — ABNORMAL HIGH (ref 0.0–100.0)

## 2021-08-29 NOTE — Addendum Note (Signed)
Addended by: [name] on: 08/29/2021 04:29 PM   Modules accepted: Orders

## 2021-08-29 NOTE — Telephone Encounter (Signed)
Patient would like referral go to [facility] not [facility] to have her esophagus stretch.

## 2021-08-29 NOTE — Telephone Encounter (Signed)
Referral placed. Please let pt know that changing providers may result a longer wait

## 2021-08-30 NOTE — Telephone Encounter (Signed)
Spoke to pt and let her know that new referral was placed.

## 2021-08-31 ENCOUNTER — Other Ambulatory Visit: Payer: Self-pay | Admitting: Family Medicine

## 2021-08-31 DIAGNOSIS — D509 Iron deficiency anemia, unspecified: Secondary | ICD-10-CM

## 2021-08-31 NOTE — Progress Notes (Signed)
Referral placed to hematology given persistent anemia

## 2021-09-02 IMAGING — US US ABDOMEN LIMITED
1 series · 14 of 25 positions shown · non-contrast
Comparison: 05/06/2020

CLINICAL DATA: Right upper quadrant pain

EXAM:
ULTRASOUND ABDOMEN LIMITED RIGHT UPPER QUADRANT

[Series 1: us abdomen limited · 14 of 47 slices shown]
[im 1/47]
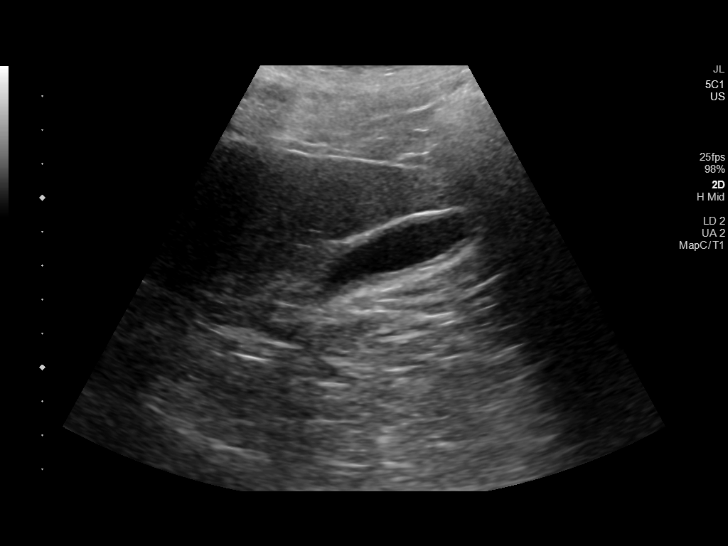
[im 4/47]
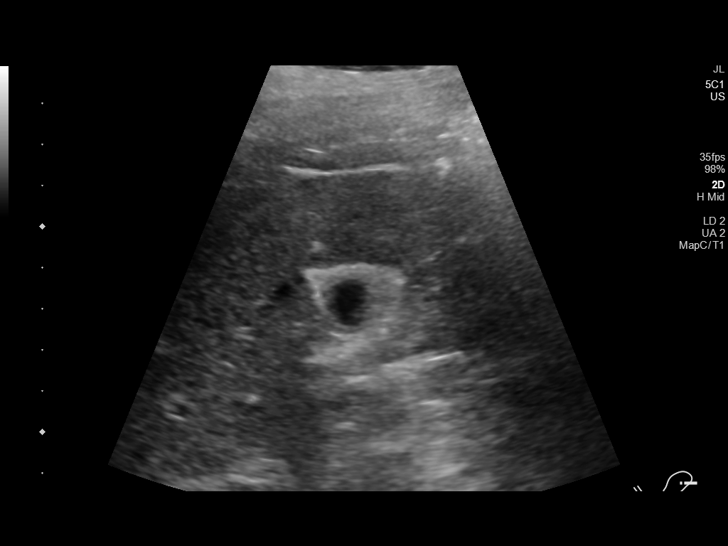
[im 8/47]
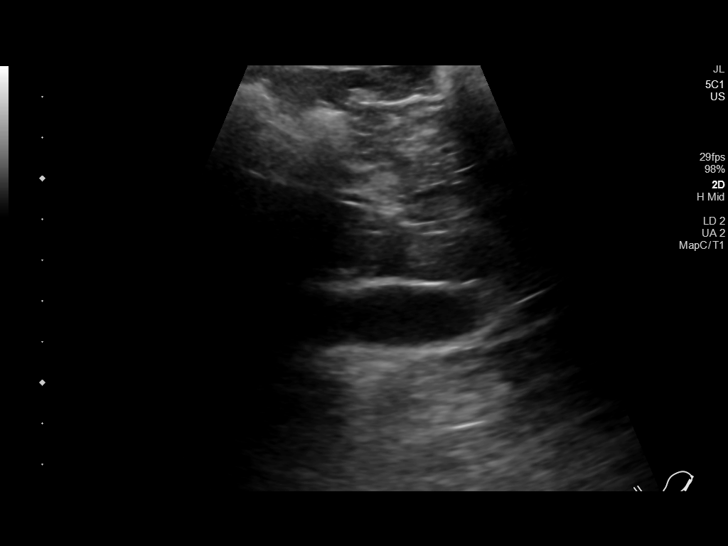
[im 12/47]
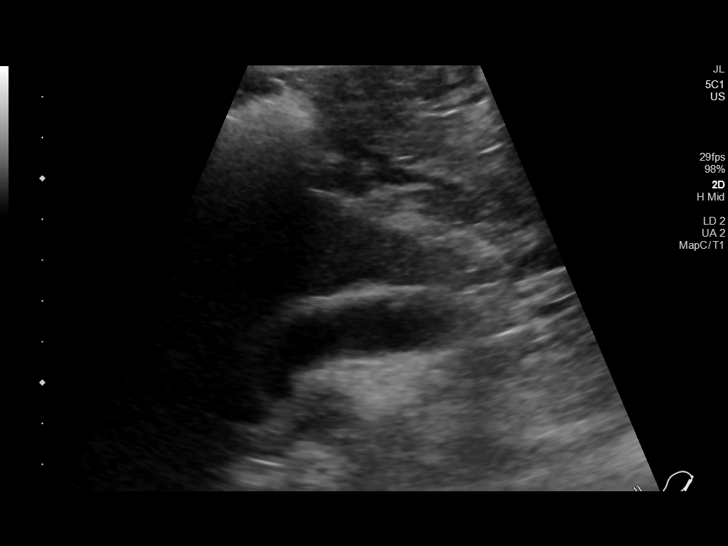
[im 16/47]
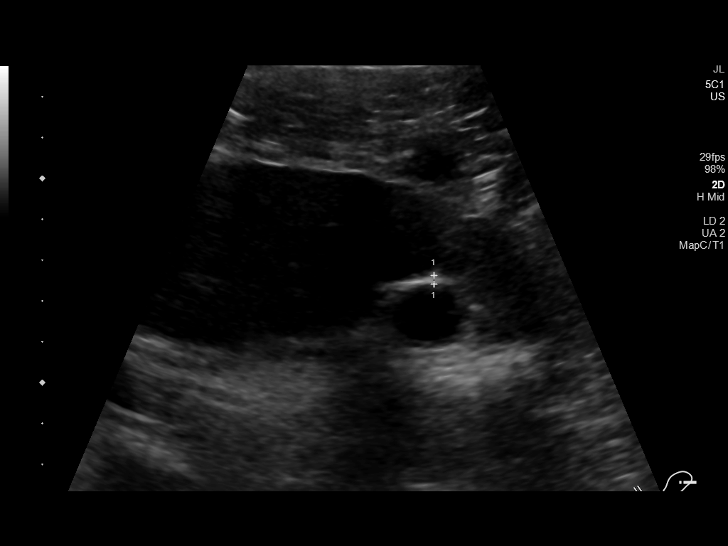
[im 18/47]
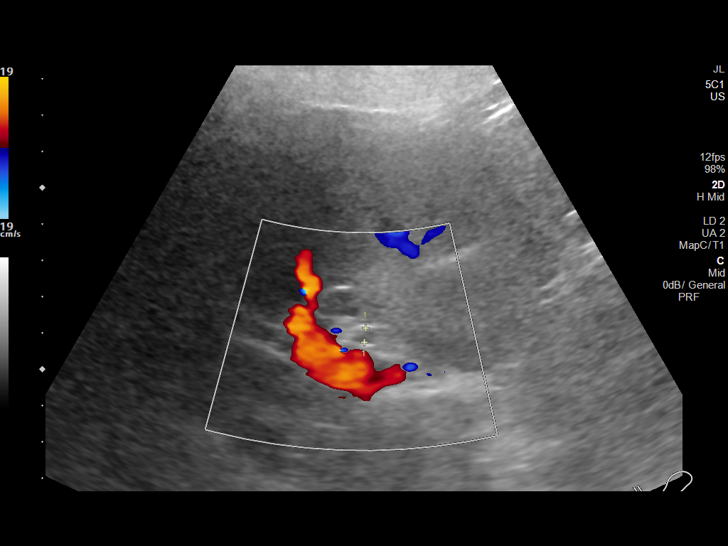
[im 22/47]
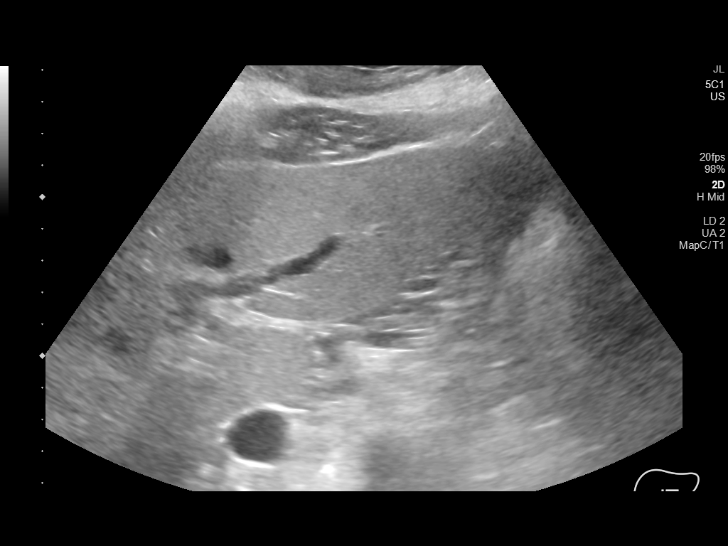
[im 25/47]
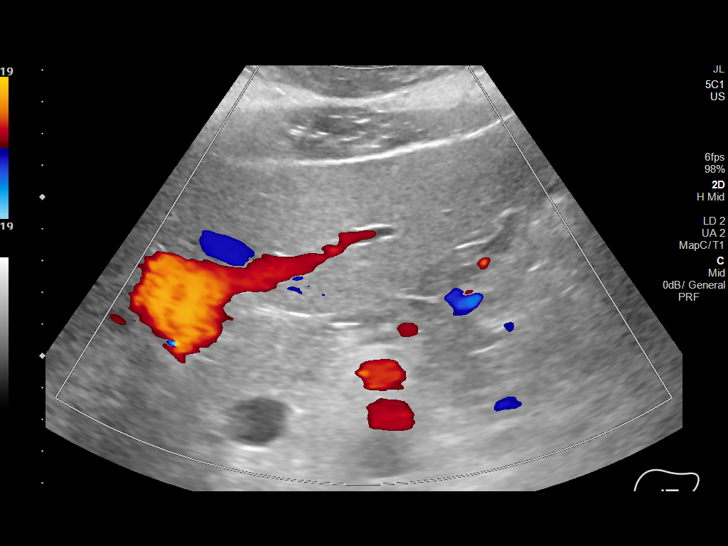
[im 29/47]
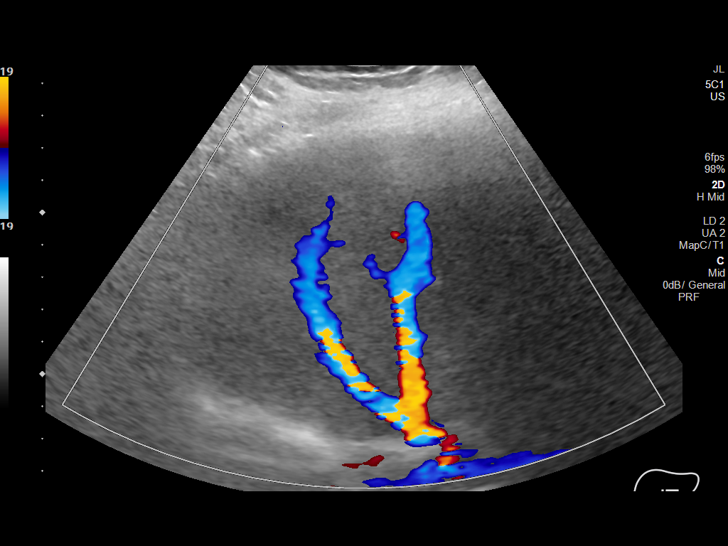
[im 31/47]
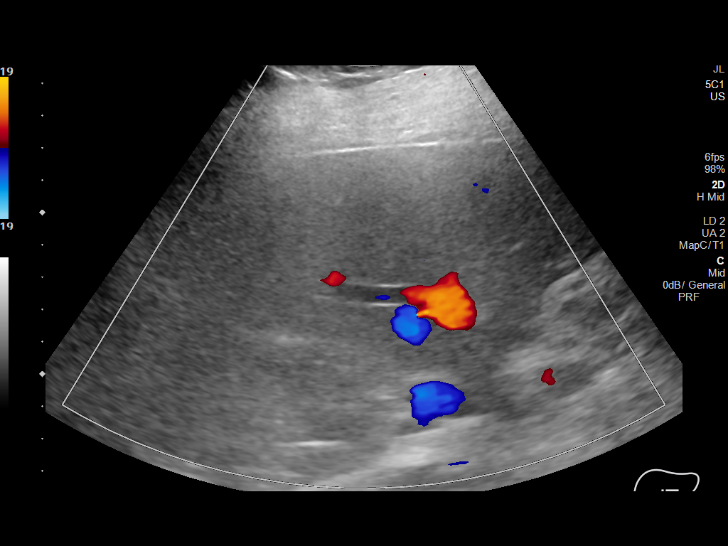
[im 35/47]
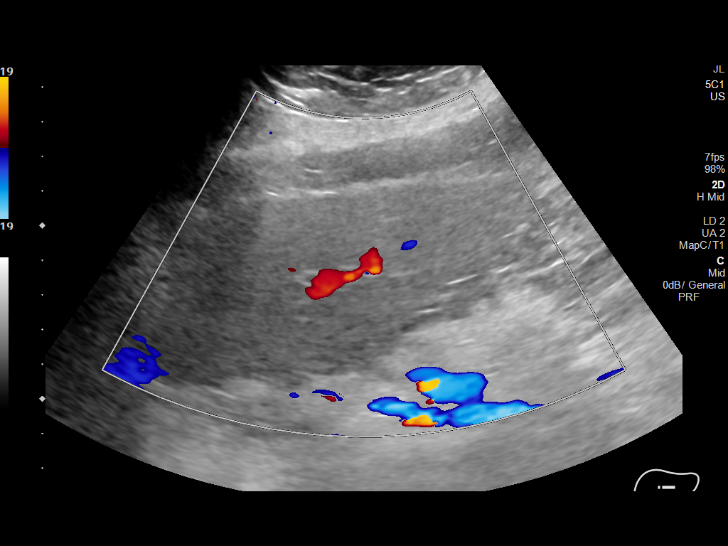
[im 39/47]
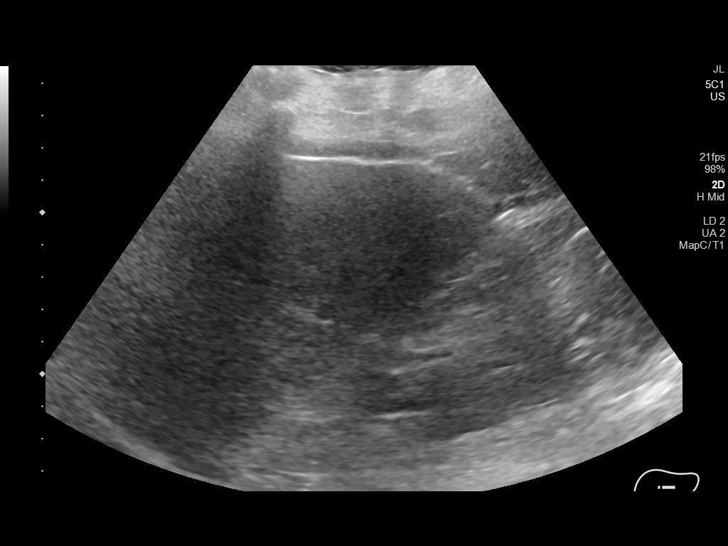
[im 43/47]
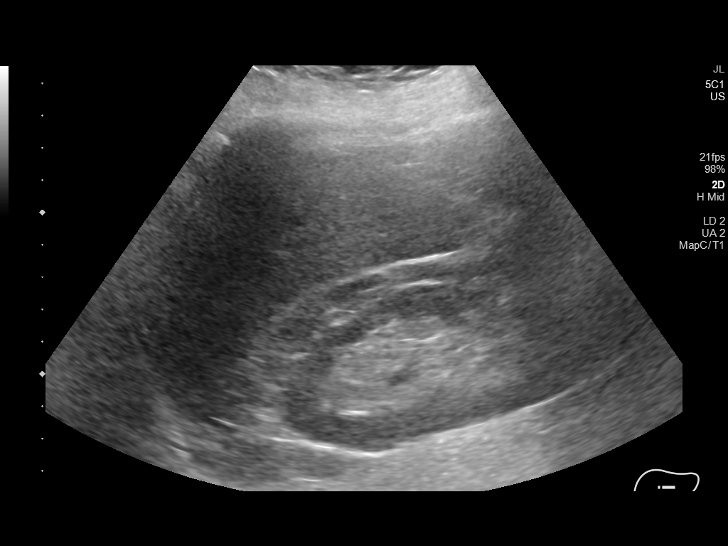
[im 47/47]
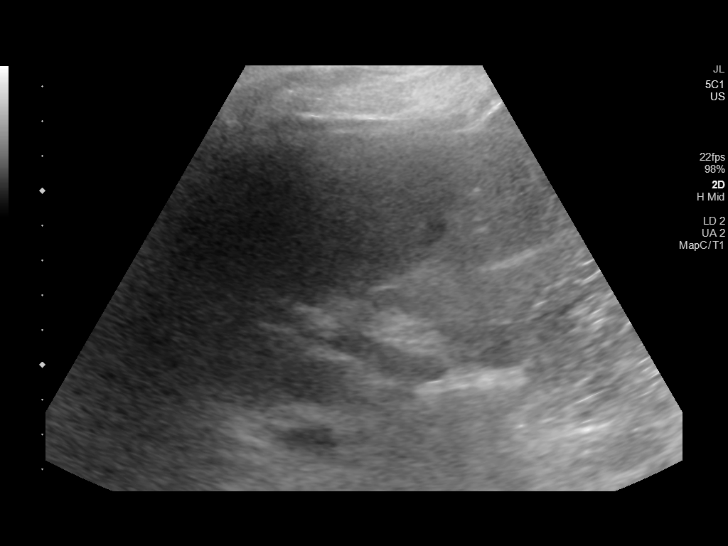

[14 of 25 positions shown; findings below may reference images not displayed]

FINDINGS: Gallbladder:

No gallstones. Top normal gallbladder wall thickness. No sonographic
Murphy sign noted by sonographer.

Common bile duct:

Diameter: 3.9 mm

Liver:

No focal lesion identified. Increased hepatic parenchymal
echogenicity. Portal vein is patent on color Doppler imaging with
normal direction of blood flow towards the liver.

Other: None.
IMPRESSION: 1. No cholelithiasis or sonographic evidence of acute cholecystitis.
Previously demonstrated 10 mm gallstone is not visualized on the
current examination and may reflect recent passage of the stone.
2. Increased hepatic echogenicity as can be seen with hepatic
steatosis.

## 2021-09-02 IMAGING — CT CT ABD-PELV W/ CM
2 of 5 series · 16 of 46 positions shown, 18 images · IV contrast (omnipaque)
Comparison: CT abdomen pelvis dated 03/28/2020.

CLINICAL DATA: 68-year-old female with right upper quadrant
abdominal pain.

EXAM:
CT ABDOMEN AND PELVIS WITH CONTRAST
TECHNIQUE: Multidetector CT imaging of the abdomen and pelvis was performed
using the standard protocol following bolus administration of
intravenous contrast.
CONTRAST:  100mL OMNIPAQUE IOHEXOL 300 MG/ML  SOLN

[Series 2: axial st · axial · 0.98mm/px · z∈[+1215,+1705]mm · 13 of 112 slices shown, 15 images]
[im 7/112  soft-tissue]
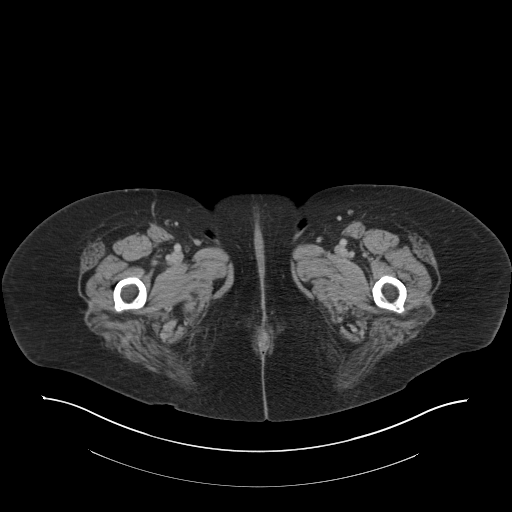
[im 7/112  bone]
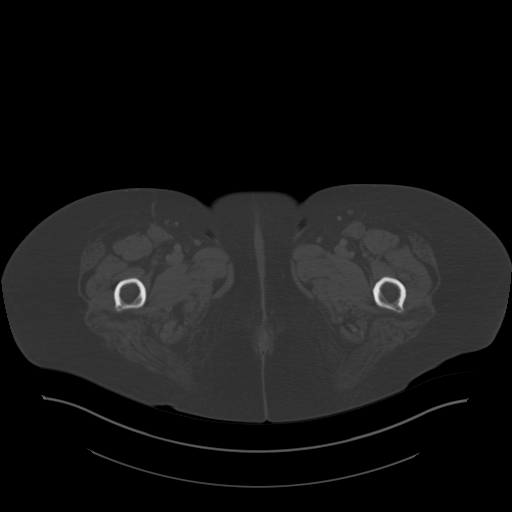
[im 14/112  soft-tissue]
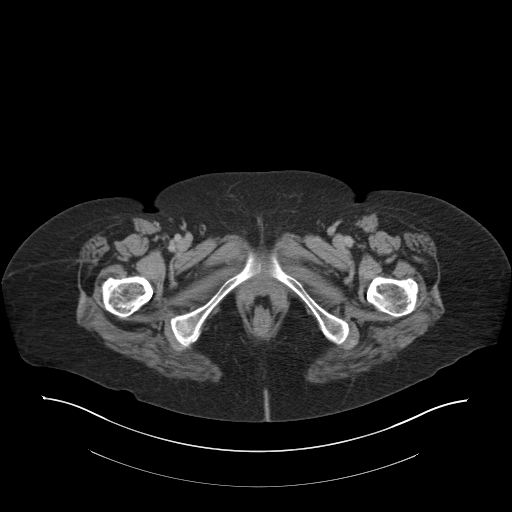
[im 21/112  soft-tissue]
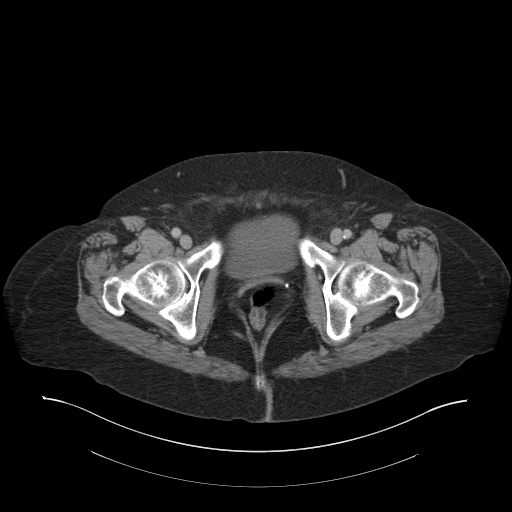
[im 35/112  soft-tissue]
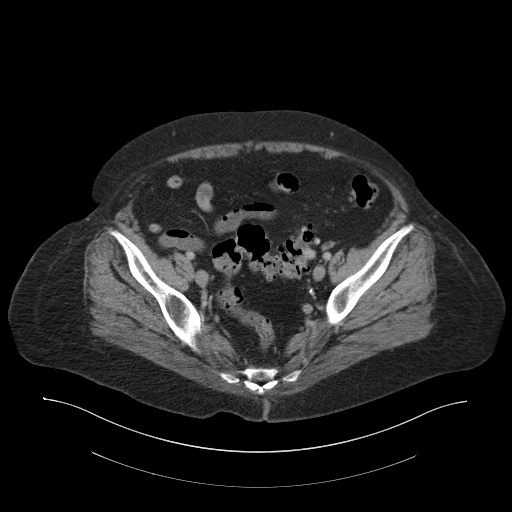
[im 42/112  soft-tissue]
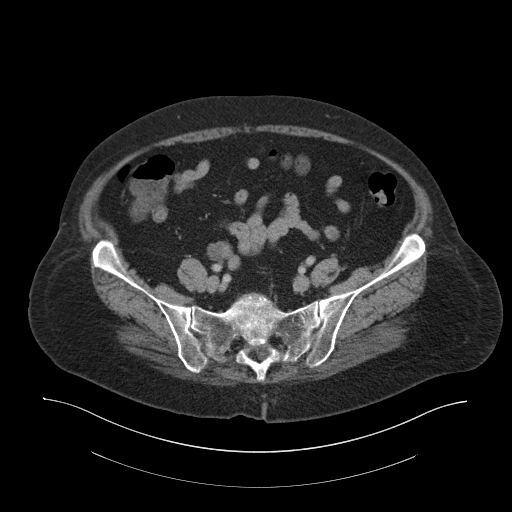
[im 49/112  soft-tissue]
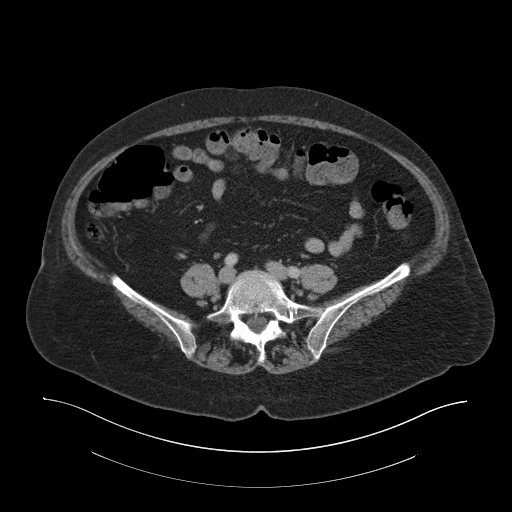
[im 56/112  soft-tissue]
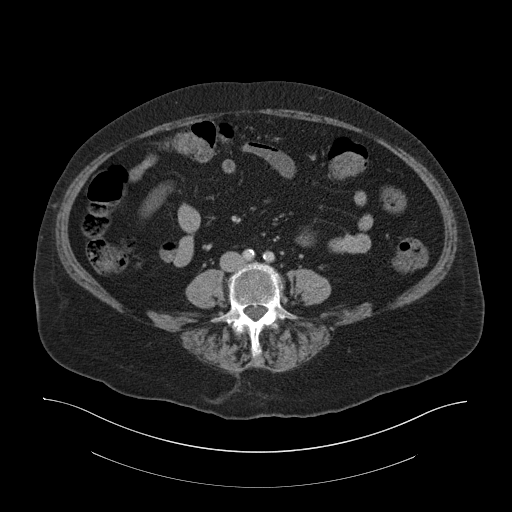
[im 63/112  soft-tissue]
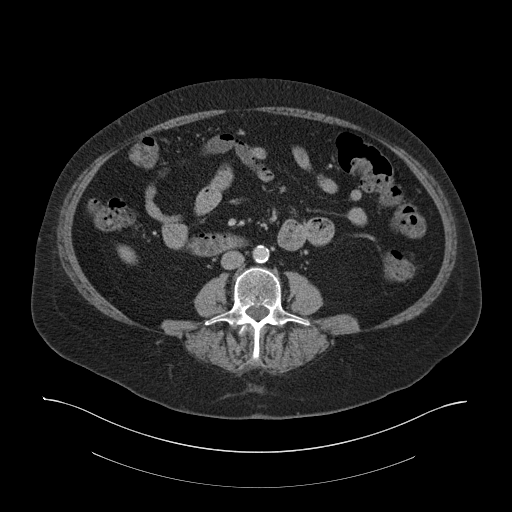
[im 70/112  soft-tissue]
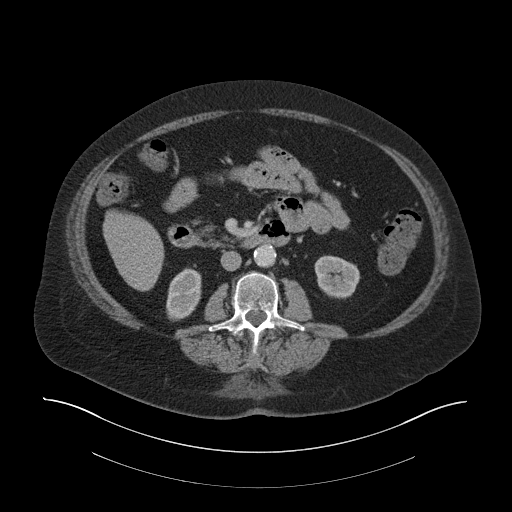
[im 70/112  bone]
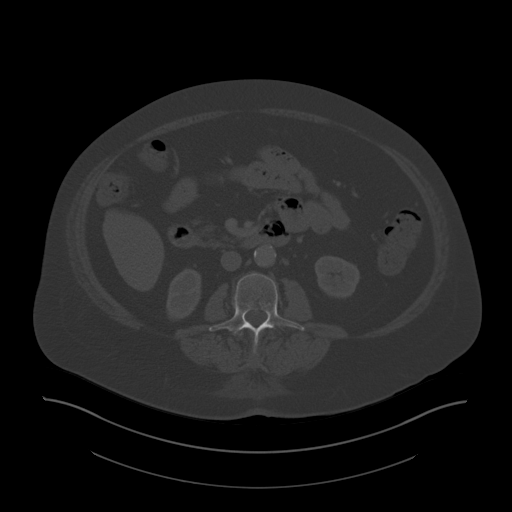
[im 77/112  soft-tissue]
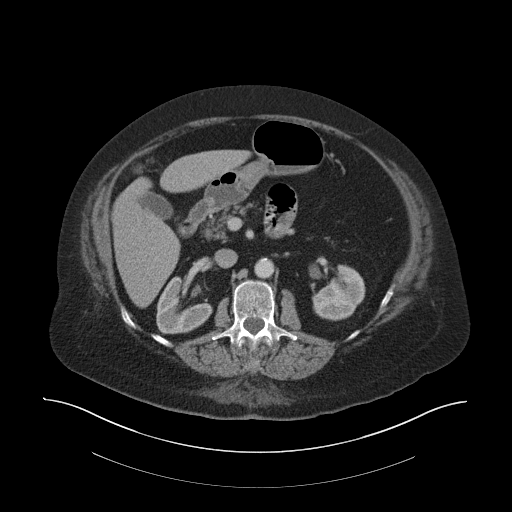
[im 91/112  soft-tissue]
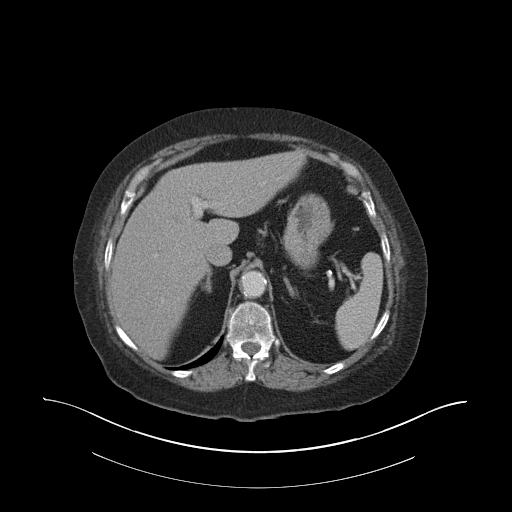
[im 98/112  soft-tissue]
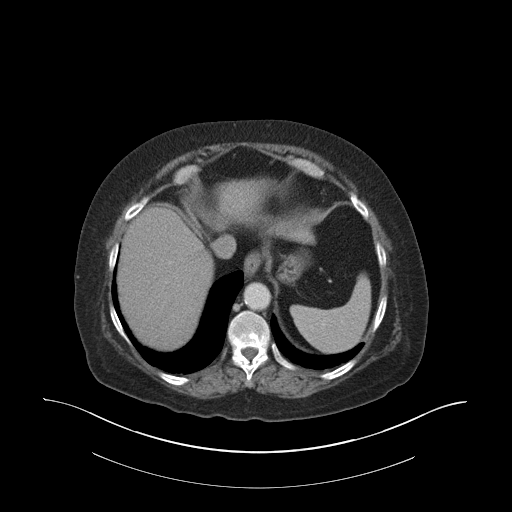
[im 105/112  soft-tissue]
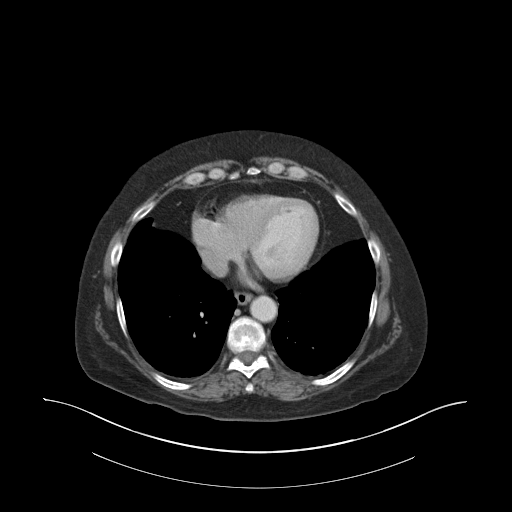

[Series 4: coronal st · coronal · 0.88mm/px · 3 of 176 slices shown]
[im 59/176  soft-tissue]
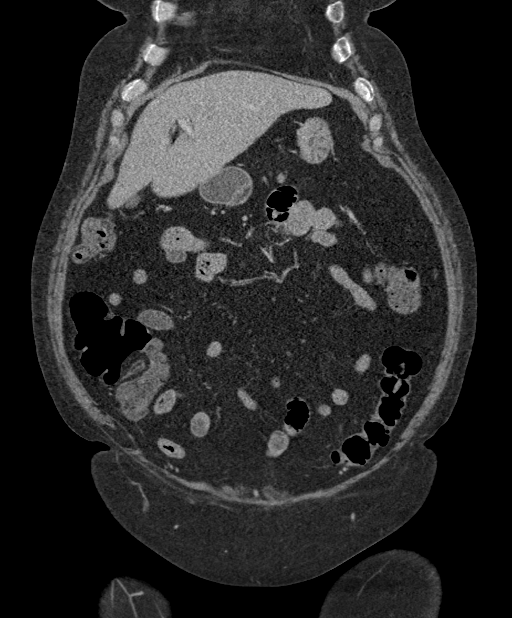
[im 78/176  soft-tissue]
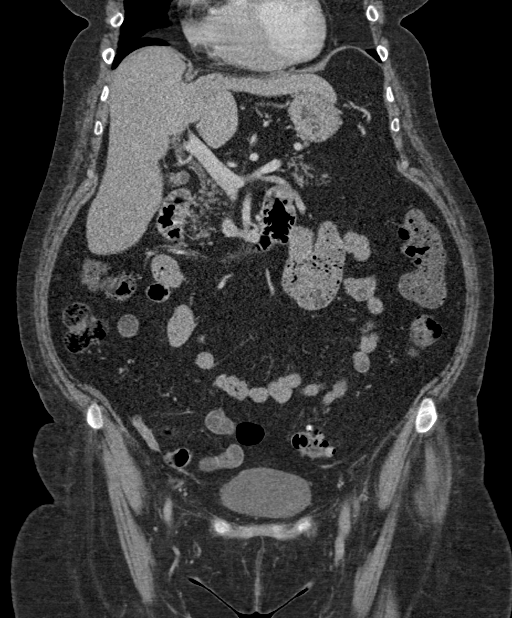
[im 98/176  soft-tissue]
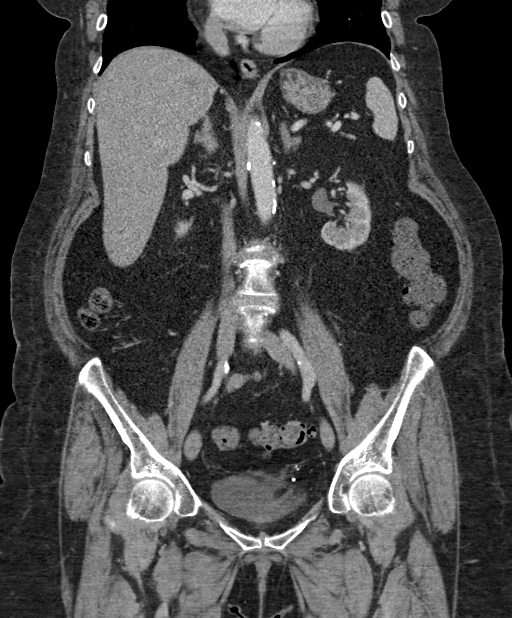

[16 of 46 positions shown; findings below may reference images not displayed]

FINDINGS: Lower chest: The visualized lung bases are clear. No intra-abdominal
free air or free fluid.

Hepatobiliary: The liver is unremarkable. No intrahepatic biliary
ductal dilatation. There is a gallstone. No pericholecystic fluid or
evidence of acute cholecystitis.

Pancreas: Unremarkable. No pancreatic ductal dilatation or
surrounding inflammatory changes.

Spleen: Normal in size without focal abnormality.

Adrenals/Urinary Tract: The adrenal glands are unremarkable. There
is no hydronephrosis on either side. There is symmetric enhancement
and excretion of contrast by both kidneys. The visualized ureters
and urinary bladder appear unremarkable.

Stomach/Bowel: There is sigmoid diverticulosis and several scattered
colonic diverticula without active inflammatory changes. There is no
bowel obstruction or active inflammation. Appendectomy.

Vascular/Lymphatic: Mild aortoiliac atherosclerotic disease. The IVC
is unremarkable. No portal venous gas. There is no adenopathy.

Reproductive: Hysterectomy.

Other: None

Musculoskeletal: No acute osseous pathology.
IMPRESSION: 1. No acute intra-abdominal or pelvic pathology.
2. Cholelithiasis.
3. Colonic diverticulosis.
4. Aortic Atherosclerosis ([id number]).

## 2021-09-03 IMAGING — NM NM HEPATOBILIARY IMAGE, INC GB
1 series · 6 of 6 positions shown · non-contrast
Comparison: CT abdomen pelvis, 05/16/2020

CLINICAL DATA: Right upper quadrant pain, cholelithiasis

EXAM:
NUCLEAR MEDICINE HEPATOBILIARY IMAGING
TECHNIQUE: Sequential images of the abdomen were obtained [DATE] minutes
following intravenous administration of radiopharmaceutical.
RADIOPHARMACEUTICALS:  4.65 mCi Kc-99m  Choletec IV

[Series 1: biliary · 3.25mm/px · 6 of 60 frames shown]
[frame 6/60]
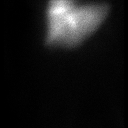
[frame 16/60]
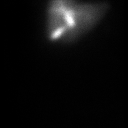
[frame 26/60]
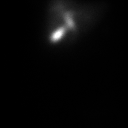
[frame 36/60]
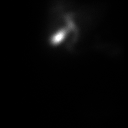
[frame 46/60]
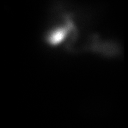
[frame 56/60]
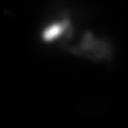

[6 of 6 positions shown; findings below may reference images not displayed]

FINDINGS: Prompt uptake and biliary excretion of activity by the liver is
seen. Gallbladder activity is visualized at 10 minutes, consistent
with patency of cystic duct. Biliary activity passes into small
bowel at 35 minutes, consistent with patent common bile duct.
IMPRESSION: The cystic and common bile ducts are patent.

## 2021-09-04 NOTE — Telephone Encounter (Signed)
Pt called in requesting to know about referral status and lab result . Would like a call back# [phone].

## 2021-09-05 NOTE — Telephone Encounter (Signed)
Pt called requesting status on referral . Would like a call back #[phone]

## 2021-09-06 ENCOUNTER — Other Ambulatory Visit: Payer: Self-pay | Admitting: Gastroenterology

## 2021-09-06 DIAGNOSIS — R1310 Dysphagia, unspecified: Secondary | ICD-10-CM | POA: Diagnosis not present

## 2021-09-06 DIAGNOSIS — R110 Nausea: Secondary | ICD-10-CM | POA: Diagnosis not present

## 2021-09-06 DIAGNOSIS — R1084 Generalized abdominal pain: Secondary | ICD-10-CM | POA: Diagnosis not present

## 2021-09-07 DIAGNOSIS — S22070D Wedge compression fracture of T9-T10 vertebra, subsequent encounter for fracture with routine healing: Secondary | ICD-10-CM | POA: Diagnosis not present

## 2021-09-07 DIAGNOSIS — R1084 Generalized abdominal pain: Secondary | ICD-10-CM | POA: Diagnosis not present

## 2021-09-11 ENCOUNTER — Other Ambulatory Visit: Payer: Self-pay | Admitting: Oncology

## 2021-09-11 ENCOUNTER — Inpatient Hospital Stay: Payer: Medicare HMO | Attending: Oncology | Admitting: Oncology

## 2021-09-11 ENCOUNTER — Encounter: Payer: Self-pay | Admitting: Oncology

## 2021-09-11 ENCOUNTER — Inpatient Hospital Stay: Payer: Medicare HMO

## 2021-09-11 VITALS — BP 144/81 | HR 78 | Temp 97.8°F | Resp 18 | Ht 73.0 in | Wt 231.8 lb

## 2021-09-11 DIAGNOSIS — N189 Chronic kidney disease, unspecified: Secondary | ICD-10-CM | POA: Insufficient documentation

## 2021-09-11 DIAGNOSIS — Z803 Family history of malignant neoplasm of breast: Secondary | ICD-10-CM | POA: Diagnosis not present

## 2021-09-11 DIAGNOSIS — I129 Hypertensive chronic kidney disease with stage 1 through stage 4 chronic kidney disease, or unspecified chronic kidney disease: Secondary | ICD-10-CM | POA: Diagnosis not present

## 2021-09-11 DIAGNOSIS — Z79899 Other long term (current) drug therapy: Secondary | ICD-10-CM | POA: Diagnosis not present

## 2021-09-11 DIAGNOSIS — D509 Iron deficiency anemia, unspecified: Secondary | ICD-10-CM | POA: Diagnosis not present

## 2021-09-11 DIAGNOSIS — D631 Anemia in chronic kidney disease: Secondary | ICD-10-CM | POA: Diagnosis not present

## 2021-09-11 DIAGNOSIS — K5900 Constipation, unspecified: Secondary | ICD-10-CM | POA: Insufficient documentation

## 2021-09-11 DIAGNOSIS — S22070A Wedge compression fracture of T9-T10 vertebra, initial encounter for closed fracture: Secondary | ICD-10-CM | POA: Diagnosis not present

## 2021-09-11 DIAGNOSIS — K219 Gastro-esophageal reflux disease without esophagitis: Secondary | ICD-10-CM | POA: Diagnosis not present

## 2021-09-11 DIAGNOSIS — Z87891 Personal history of nicotine dependence: Secondary | ICD-10-CM | POA: Insufficient documentation

## 2021-09-11 DIAGNOSIS — R69 Illness, unspecified: Secondary | ICD-10-CM | POA: Diagnosis not present

## 2021-09-11 DIAGNOSIS — D508 Other iron deficiency anemias: Secondary | ICD-10-CM

## 2021-09-11 DIAGNOSIS — N1831 Chronic kidney disease, stage 3a: Secondary | ICD-10-CM | POA: Insufficient documentation

## 2021-09-11 DIAGNOSIS — M47814 Spondylosis without myelopathy or radiculopathy, thoracic region: Secondary | ICD-10-CM | POA: Insufficient documentation

## 2021-09-11 DIAGNOSIS — I509 Heart failure, unspecified: Secondary | ICD-10-CM | POA: Diagnosis not present

## 2021-09-11 DIAGNOSIS — F419 Anxiety disorder, unspecified: Secondary | ICD-10-CM | POA: Diagnosis not present

## 2021-09-11 DIAGNOSIS — Z800 Family history of malignant neoplasm of digestive organs: Secondary | ICD-10-CM | POA: Insufficient documentation

## 2021-09-11 DIAGNOSIS — R5383 Other fatigue: Secondary | ICD-10-CM | POA: Insufficient documentation

## 2021-09-11 LAB — CBC WITH DIFFERENTIAL/PLATELET
Abs Immature Granulocytes: 0.01 10*3/uL (ref 0.00–0.07)
Basophils Absolute: 0 10*3/uL (ref 0.0–0.1)
Basophils Relative: 0 %
Eosinophils Absolute: 0.2 10*3/uL (ref 0.0–0.5)
Eosinophils Relative: 3 %
HCT: 35.5 % — ABNORMAL LOW (ref 36.0–46.0)
Hemoglobin: 10.5 g/dL — ABNORMAL LOW (ref 12.0–15.0)
Immature Granulocytes: 0 %
Lymphocytes Relative: 38 %
Lymphs Abs: 2.3 10*3/uL (ref 0.7–4.0)
MCH: 23.4 pg — ABNORMAL LOW (ref 26.0–34.0)
MCHC: 29.6 g/dL — ABNORMAL LOW (ref 30.0–36.0)
MCV: 79.2 fL — ABNORMAL LOW (ref 80.0–100.0)
Monocytes Absolute: 0.4 10*3/uL (ref 0.1–1.0)
Monocytes Relative: 6 %
Neutro Abs: 3.2 10*3/uL (ref 1.7–7.7)
Neutrophils Relative %: 53 %
Platelets: 344 10*3/uL (ref 150–400)
RBC: 4.48 MIL/uL (ref 3.87–5.11)
RDW: 15.6 % — ABNORMAL HIGH (ref 11.5–15.5)
WBC: 6.1 10*3/uL (ref 4.0–10.5)
nRBC: 0 % (ref 0.0–0.2)

## 2021-09-11 LAB — TECHNOLOGIST SMEAR REVIEW
Plt Morphology: NORMAL
RBC MORPHOLOGY: NORMAL
WBC MORPHOLOGY: NORMAL

## 2021-09-11 LAB — FERRITIN: Ferritin: 42 ng/mL (ref 11–307)

## 2021-09-11 LAB — RETIC PANEL
Immature Retic Fract: 7.3 % (ref 2.3–15.9)
RBC.: 4.41 MIL/uL (ref 3.87–5.11)
Retic Count, Absolute: 40.1 10*3/uL (ref 19.0–186.0)
Retic Ct Pct: 0.9 % (ref 0.4–3.1)
Reticulocyte Hemoglobin: 25.4 pg — ABNORMAL LOW (ref >27.9)

## 2021-09-11 LAB — IRON AND TIBC
Iron: 24 ug/dL — ABNORMAL LOW (ref 28–170)
Saturation Ratios: 7 % — ABNORMAL LOW (ref 10.4–31.8)
TIBC: 351 ug/dL (ref 250–450)
UIBC: 327 ug/dL

## 2021-09-11 NOTE — Progress Notes (Signed)
Pt her to establish care

## 2021-09-11 NOTE — Progress Notes (Signed)
Hematology/Oncology Consult note [facility] Telephone:([phone] [phone]   Patient Care Team: [name], MD as PCP - General (Family Medicine) [facility], [facility] as Pharmacist (Pharmacist)  REFERRING PROVIDER: [name], MD  CHIEF COMPLAINTS/REASON FOR VISIT:  Evaluation of anemia  HISTORY OF PRESENTING ILLNESS:   Morgan [name] is a  70 y.o.  female with PMH listed below was seen in consultation at the request of  [name], MD  for evaluation of anemia Patient had blood work done recently on 08/29/2021. CBC showed a hemoglobin of 9.9, MCV 75.5. Patient denies any black stool or blood in the stool. She has a history of gastric stenosis for which she got EGD dilation periodically.  Last EGD was 08/11/2020. Patient reports feeling tired and fatigued. She has taken oral iron supplementation before.  Iron has made her constipation worse.   Review of Systems  Constitutional:  Positive for fatigue. Negative for appetite change, chills and fever.  HENT:   Negative for hearing loss and voice change.   Eyes:  Negative for eye problems.  Respiratory:  Negative for chest tightness and cough.   Cardiovascular:  Negative for chest pain.  Gastrointestinal:  Negative for abdominal distention, abdominal pain and blood in stool.  Endocrine: Negative for hot flashes.  Genitourinary:  Negative for difficulty urinating and frequency.   Musculoskeletal:  Negative for arthralgias.  Skin:  Negative for itching and rash.  Neurological:  Negative for extremity weakness.  Hematological:  Negative for adenopathy.  Psychiatric/Behavioral:  Negative for confusion.    MEDICAL HISTORY:  Past Medical History:  Diagnosis Date   Allergy    Anemia    Anxiety    Arthritis    CHF (congestive heart failure) (HCC)    Depression    GERD (gastroesophageal reflux disease)    Hypertension    MVP (mitral valve prolapse)     SURGICAL  HISTORY: Past Surgical History:  Procedure Laterality Date   ABDOMINAL HYSTERECTOMY     APPENDECTOMY     BALLOON DILATION N/A 04/21/2020   Procedure: BALLOON DILATION;  Surgeon: Milus Banister, MD;  Location: [facility];  Service: Endoscopy;  Laterality: N/A;  pyloric/ GI   BALLOON DILATION N/A 07/04/2020   Procedure: BALLOON DILATION;  Surgeon: Mauri Pole, MD;  Location: [facility];  Service: Endoscopy;  Laterality: N/A;   BIOPSY  04/21/2020   Procedure: BIOPSY;  Surgeon: Milus Banister, MD;  Location: [facility];  Service: Endoscopy;;   BIOPSY  07/04/2020   Procedure: BIOPSY;  Surgeon: Mauri Pole, MD;  Location: [facility];  Service: Endoscopy;;  EGD and COLON   BREAST SURGERY Right 1988   tumor removal   CHOLECYSTECTOMY N/A 05/18/2020   Procedure: LAPAROSCOPIC CHOLECYSTECTOMY;  Surgeon: Johnathan Hausen, MD;  Location: [facility];  Service: General;  Laterality: N/A;   COLONOSCOPY WITH PROPOFOL N/A 07/04/2020   Procedure: COLONOSCOPY WITH PROPOFOL;  Surgeon: Mauri Pole, MD;  Location: [facility];  Service: Endoscopy;  Laterality: N/A;   ESOPHAGEAL DILATION  08/11/2020   Procedure: PYLORIC DILATION;  Surgeon: Doran Stabler, MD;  Location: [facility];  Service: Gastroenterology;;   ESOPHAGOGASTRODUODENOSCOPY (EGD) WITH PROPOFOL N/A 04/21/2020   Procedure: ESOPHAGOGASTRODUODENOSCOPY (EGD) WITH PROPOFOL;  Surgeon: Milus Banister, MD;  Location: [facility];  Service: Endoscopy;  Laterality: N/A;   ESOPHAGOGASTRODUODENOSCOPY (EGD) WITH PROPOFOL N/A 07/04/2020   Procedure: ESOPHAGOGASTRODUODENOSCOPY (EGD) WITH PROPOFOL;  Surgeon: Mauri Pole, MD;  [facility];  Service: Endoscopy;  Laterality: N/A;   ESOPHAGOGASTRODUODENOSCOPY (EGD) WITH PROPOFOL N/A 08/11/2020   Procedure: ESOPHAGOGASTRODUODENOSCOPY (EGD) WITH PROPOFOL;  Surgeon: Doran Stabler, MD;  Location: [facility];  Service: Gastroenterology;  Laterality: N/A;   KNEE SURGERY   2005   2 times left   KNEE SURGERY Right    KYPHOPLASTY N/A 07/20/2021   Procedure: T 10KYPHOPLASTY;  Surgeon: Hessie Knows, MD;  Location: [facility];  Service: Orthopedics;  Laterality: N/A;   KYPHOPLASTY N/A 08/23/2021   Procedure: T9 KYPHOPLASTY;  Surgeon: Hessie Knows, MD;  Location: [facility];  Service: Orthopedics;  Laterality: N/A;   POLYPECTOMY  07/04/2020   Procedure: POLYPECTOMY;  Surgeon: Mauri Pole, MD;  Location: [facility];  Service: Endoscopy;;   TUBAL LIGATION      SOCIAL HISTORY: Social History   Socioeconomic History   Marital status: Single    Spouse name: Not on file   Number of children: 2   Years of education: Not on file   Highest education level: Not on file  Occupational History   Not on file  Tobacco Use   Smoking status: Former    Packs/day: 1.00    Years: 15.00    Pack years: 15.00    Types: Cigarettes    Quit date: 09/12/2017    Years since quitting: 4.0   Smokeless tobacco: Never  Vaping Use   Vaping Use: Never used  Substance and Sexual Activity   Alcohol use: Not Currently   Drug use: No   Sexual activity: Not Currently  Other Topics Concern   Not on file  Social History Narrative   Lives with Grandson's great grandparents    32 - 63 years old ([place])   Daughter - [name] - lives in the [place] system - family, aunt, [name] and [name] (who she lives with), brother   Transportation: roommates   Enjoy: [employer], playing with grandson   Exercise: PT exercises   Diet: good - chicken, some steak, tries to eat healthy, avoids fried foods   Retired from being a Materials engineer of Radio broadcast assistant Strain: Not on Art therapist Insecurity: Not on file  Transportation Needs: Not on file  Physical Activity: Not on file  Stress: Not on file  Social Connections: Not on file  Intimate Partner Violence: Not on file    FAMILY HISTORY: Family History  Problem Relation Age of Onset   Breast  cancer Mother 25   Ovarian cancer Mother 70   Brain cancer Mother    Hypertension Father    Hyperlipidemia Father    Heart disease Father    Colon cancer Father        diagnosed in his 53s   Breast cancer Maternal Grandmother 94   Diabetes Paternal Grandmother    Breast cancer Paternal Grandmother 35   Other Son        drug over dose   Colon cancer Maternal Grandfather        dx in his 4s   Liver disease Maternal Grandfather    Breast cancer Maternal Aunt 60   Breast cancer Maternal Aunt 60   Colon cancer Maternal Uncle    Stomach cancer Neg Hx    Pancreatic cancer Neg Hx    Esophageal cancer Neg Hx     ALLERGIES:  is allergic to meperidine, strawberry extract, carafate [sucralfate], and wasp venom.  MEDICATIONS:  Current Outpatient Medications  Medication Sig Dispense  Refill   albuterol (VENTOLIN HFA) 108 (90 Base) MCG/ACT inhaler Inhale 1-2 puffs into the lungs every 6 (six) hours as needed for wheezing or shortness of breath. 18 g 0   alendronate (FOSAMAX) 70 MG tablet Take 1 tablet (70 mg total) by mouth every 7 (seven) days. Take with a full glass of water on an empty stomach. 4 tablet 11   aspirin-acetaminophen-caffeine (EXCEDRIN MIGRAINE) 250-250-65 MG tablet Take 1-2 tablets by mouth every 6 (six) hours as needed for headache.     FLUoxetine (PROZAC) 40 MG capsule Take 80 mg by mouth daily.     furosemide (LASIX) 40 MG tablet Take 1 tablet (40 mg total) by mouth daily as needed for fluid or edema. (Patient taking differently: Take 40 mg by mouth 2 (two) times daily.) 30 tablet    gabapentin (NEURONTIN) 400 MG capsule Take 400 mg by mouth 3 (three) times daily as needed (for nerve pain).     hydrOXYzine (ATARAX/VISTARIL) 25 MG tablet Take 25 mg by mouth every 8 (eight) hours as needed for anxiety.     ibuprofen (ADVIL) 600 MG tablet Take 1 tablet (600 mg total) by mouth every 6 (six) hours as needed. 30 tablet 0   LINZESS 145 MCG CAPS capsule TAKE 1 CAPSULE (145 MCG  TOTAL) BY MOUTH DAILY BEFORE BREAKFAST. (Patient taking differently: Take 145 mcg by mouth daily as needed (constipation.).) 30 capsule 3   mirtazapine (REMERON) 15 MG tablet Take 15 mg by mouth at bedtime.     omeprazole (PRILOSEC) 40 MG capsule TAKE 1 CAPSULE BY MOUTH EVERY DAY 90 capsule 3   ondansetron (ZOFRAN-ODT) 4 MG disintegrating tablet Take 1-2 tablets by mouth every 6 hours as needed 360 tablet 3   oxyCODONE (ROXICODONE) 5 MG immediate release tablet Take 1 tablet (5 mg total) by mouth every 8 (eight) hours as needed. 20 tablet 0   OXYGEN Inhale 2 L/min into the lungs See admin instructions. 2 L/min at bedtime and as needed for shortness of breath throughout the day     potassium chloride SA (KLOR-CON) 20 MEQ tablet Take 1 tablet (20 mEq total) by mouth daily. 30 tablet 3   valsartan (DIOVAN) 320 MG tablet Take 320 mg by mouth daily.     No current facility-administered medications for this visit.     PHYSICAL EXAMINATION: ECOG PERFORMANCE STATUS: 1 - Symptomatic but completely ambulatory Vitals:   09/11/21 1041  BP: (!) 144/81  Pulse: 78  Resp: 18  Temp: 97.8 F (36.6 C)   Filed Weights   09/11/21 1041  Weight: 231 lb 12.8 oz (105.1 kg)    Physical Exam Constitutional:      General: She is not in acute distress. HENT:     Head: Normocephalic and atraumatic.  Eyes:     General: No scleral icterus. Cardiovascular:     Rate and Rhythm: Normal rate and regular rhythm.     Heart sounds: Normal heart sounds.  Pulmonary:     Effort: Pulmonary effort is normal. No respiratory distress.     Breath sounds: No wheezing.  Abdominal:     General: Bowel sounds are normal. There is no distension.     Palpations: Abdomen is soft.  Musculoskeletal:        General: No deformity. Normal range of motion.     Cervical back: Normal range of motion and neck supple.  Skin:    General: Skin is warm and dry.     Findings: No erythema or  rash.  Neurological:     Mental Status: She  is alert and oriented to person, place, and time. Mental status is at baseline.     Cranial Nerves: No cranial nerve deficit.     Coordination: Coordination normal.  Psychiatric:        Mood and Affect: Mood normal.    LABORATORY DATA:  I have reviewed the data as listed Lab Results  Component Value Date   WBC 6.1 09/11/2021   HGB 10.5 (L) 09/11/2021   HCT 35.5 (L) 09/11/2021   MCV 79.2 (L) 09/11/2021   PLT 344 09/11/2021   Recent Labs    10/20/20 1511 02/05/21 0741 02/20/21 1637 08/29/21 0931  NA 139 141 140 141  K 4.2 2.9* 3.3* 3.2*  CL 99 98 102 93*  CO2 _0 39*  GLUCOSE 108* 120* 103* 116*  BUN 27* _1 CREATININE 1.44* 1.08* 1.10* 1.01  CALCIUM 9.0 8.7* 8.4* 9.0  GFRNONAA  --  56* 55*  --   PROT 6.7 6.0*  --  6.5  ALBUMIN 3.7 3.0*  --  3.5  AST 18 16  --  15  ALT 11 12  --  9  ALKPHOS 109 109  --  152*  BILITOT 0.2 0.3  --  0.5   Iron/TIBC/Ferritin/ %Sat    Component Value Date/Time   IRON 24 (L) 09/11/2021 1113   TIBC 351 09/11/2021 1113   FERRITIN 42 09/11/2021 1113   IRONPCTSAT 7 (L) 09/11/2021 1113      RADIOGRAPHIC STUDIES: I have personally reviewed the radiological images as listed and agreed with the findings in the report. DG Chest 2 View  Result Date: 08/29/2021 CLINICAL DATA:  Shortness of breath and chest pain following recent fall, initial encounter EXAM: CHEST - 2 VIEW COMPARISON:  06/12/2021 FINDINGS: Cardiac shadow is stable. Mild aortic calcifications are noted. Mild interstitial changes are noted bilaterally stable from the prior exam. Previously seen peripheral infiltrate on the right has resolved in the interval. No definitive rib deformities are identified. Changes of prior vertebral augmentation are seen new from the prior study. Healed sternal fracture is again identified stable from prior CT examination. IMPRESSION: Chronic interstitial changes. No acute bony abnormality is noted. Electronically Signed   By: Inez Catalina M.D.    On: 08/29/2021 09:00   DG Thoracic Spine 2 View  Result Date: 08/23/2021 CLINICAL DATA:  Surgery.  T9 kyphoplasty. EXAM: DG C-ARM 1-60 MIN; THORACIC SPINE 2 VIEWS FLUOROSCOPY TIME:  Radiation Exposure Index (if provided by the fluoroscopic device): 4.4 mGy. Number of Acquired Spot Images: 2 COMPARISON:  None. FINDINGS: Two C-arm fluoroscopic images were obtained intraoperatively and submitted for post operative interpretation. These images demonstrate interval T9 kyphoplasty with some extravasation of cement into the T9-T10 disc space. Prior T10 kyphoplasty. Please see the performing provider's procedural report for further detail. IMPRESSION: Intraoperative fluoroscopy, as detailed above. Electronically Signed   By: Margaretha Sheffield M.D.   On: 08/23/2021 16:38   MR THORACIC SPINE WO CONTRAST  Result Date: 08/19/2021 CLINICAL DATA:  Closed wedge compression fracture of T10 vertebra, initial encounter. Additional history provided: Patient reports fall 2 months ago with kyphoplasty 1 month ago, 3 days ago patient bent over and felt a "pop" with continuous pain since that time. EXAM: MRI THORACIC SPINE WITHOUT CONTRAST TECHNIQUE: Multiplanar, multisequence MR imaging of the thoracic spine was performed. No intravenous contrast was administered. COMPARISON:  MRI of the thoracic spine 07/12/2021. FINDINGS: Alignment: No  significant spondylolisthesis. No significant bony retropulsion. Vertebrae: Redemonstrated subacute T10 vertebral compression fracture with interval kyphoplasty at this level. There is persistent edema throughout much of the T10 vertebral body, extending into the right posterior elements. T10 vertebral body height loss is similar to slightly progressed as compared to the prior examination (30%). New from the prior examination, there is a compression fracture of the T9 inferior endplate with subtle vertebral body height loss. Horizontally oriented fracture plane within the inferior T9 vertebral  body. There is edema throughout much of the T9 vertebral body. Superimposed T9 vertebral body hemangioma. Elsewhere, no significant marrow edema or focal suspicious osseous lesion is identified. Vertebral body height is otherwise maintained. Cord:  No spinal cord signal abnormality is identified. Paraspinal and other soft tissues: No abnormality identified within included portions of the abdomen/retroperitoneum. No abnormality identified within included portions of the thorax. Disc levels: Numerous mild degeneration at any level. Shallow multilevel disc bulges. Redemonstrated small central disc protrusions at T3-T4 and T12-L1 (without significant spinal canal stenosis). Redemonstrated broad-based left central disc protrusion at T11-12, mildly effacing the ventral thecal sac (without spinal cord mass effect). Impressions 1 and 2 will be called to the ordering clinician or representative by the Radiologist Assistant, and communication documented in the PACS or [employer]. IMPRESSION: Since the prior thoracic spine MRI of 07/12/2021, there has developed an acute T9 inferior endplate compression fracture with mild vertebral body height loss (10% or less). Edema throughout much of the T9 vertebral body. No significant bony retropulsion. Redemonstrated subacute T10 vertebral compression fracture with interval kyphoplasty at this level. Persistent edema throughout much of the T10 vertebral body, extending into the posterior elements on the right. T10 vertebral body height loss is similar to slightly progressed from the prior examination (30%). Unchanged thoracic spondylosis, as described. Electronically Signed   By: Kellie Simmering D.O.   On: 08/19/2021 14:23   DG C-Arm 1-60 Min  Result Date: 08/23/2021 CLINICAL DATA:  Surgery.  T9 kyphoplasty. EXAM: DG C-ARM 1-60 MIN; THORACIC SPINE 2 VIEWS FLUOROSCOPY TIME:  Radiation Exposure Index (if provided by the fluoroscopic device): 4.4 mGy. Number of Acquired Spot Images:  2 COMPARISON:  None. FINDINGS: Two C-arm fluoroscopic images were obtained intraoperatively and submitted for post operative interpretation. These images demonstrate interval T9 kyphoplasty with some extravasation of cement into the T9-T10 disc space. Prior T10 kyphoplasty. Please see the performing provider's procedural report for further detail. IMPRESSION: Intraoperative fluoroscopy, as detailed above. Electronically Signed   By: Margaretha Sheffield M.D.   On: 08/23/2021 16:38      ASSESSMENT & PLAN:  1. Other iron deficiency anemia   2. Stage 3a chronic kidney disease (HCC)    #Microcytic anemia, Check CBC, iron TIBC ferritin.  Iron panel is consistent with iron deficiency Discussed with patient about oral iron supplementation and IV Venofer option. Patient has chronic constipation on Linzess.  Oral iron supplementation may further exacerbate her symptoms. Recommend IV Venofer treatments.  Allergy reactions/infusion reaction including anaphylactic reaction discussed with patient. Other side effects include but not limited to high blood pressure, skin rash, weight gain, leg swelling, etc. Patient voices understanding and willing to proceed.  We will schedule patient to start Venofer treatment at the next visit.  Chronic kidney disease, recommend checking SPEP, light chain ratio.  Rule out other etiologies.   Orders Placed This Encounter  Procedures   CBC with Differential/Platelet    Standing Status:   Future    Number of Occurrences:  1    Standing Expiration Date:   09/11/2022   Ferritin    Standing Status:   Future    Number of Occurrences:   1    Standing Expiration Date:   03/11/2022   Iron and TIBC    Standing Status:   Future    Number of Occurrences:   1    Standing Expiration Date:   09/11/2022   Technologist smear review    Standing Status:   Future    Number of Occurrences:   1    Standing Expiration Date:   09/11/2022   Retic Panel    Standing Status:   Future    Number  of Occurrences:   1    Standing Expiration Date:   09/11/2022   Multiple Myeloma Panel (SPEP&IFE w/QIG)    Standing Status:   Future    Number of Occurrences:   1    Standing Expiration Date:   09/11/2022   Kappa/lambda light chains    Standing Status:   Future    Number of Occurrences:   1    Standing Expiration Date:   09/11/2022    All questions were answered. The patient knows to call the clinic with any problems questions or concerns.   [name], MD    Return of visit: 2 weeks, MD +/- Venofer.   [name], MD, PhD  09/11/2021

## 2021-09-12 ENCOUNTER — Ambulatory Visit (INDEPENDENT_AMBULATORY_CARE_PROVIDER_SITE_OTHER): Payer: Medicare HMO

## 2021-09-12 ENCOUNTER — Other Ambulatory Visit: Payer: Self-pay

## 2021-09-12 ENCOUNTER — Ambulatory Visit
Admission: EM | Admit: 2021-09-12 | Discharge: 2021-09-12 | Disposition: A | Discharge status: Home / Self Care | Payer: Medicare HMO | Attending: Urgent Care | Admitting: Urgent Care

## 2021-09-12 ENCOUNTER — Encounter: Payer: Self-pay | Admitting: Emergency Medicine

## 2021-09-12 DIAGNOSIS — R490 Dysphonia: Secondary | ICD-10-CM

## 2021-09-12 DIAGNOSIS — C9000 Multiple myeloma not having achieved remission: Secondary | ICD-10-CM | POA: Diagnosis not present

## 2021-09-12 DIAGNOSIS — R070 Pain in throat: Secondary | ICD-10-CM

## 2021-09-12 DIAGNOSIS — J988 Other specified respiratory disorders: Secondary | ICD-10-CM | POA: Diagnosis not present

## 2021-09-12 DIAGNOSIS — R0600 Dyspnea, unspecified: Secondary | ICD-10-CM | POA: Diagnosis not present

## 2021-09-12 DIAGNOSIS — Z8679 Personal history of other diseases of the circulatory system: Secondary | ICD-10-CM | POA: Insufficient documentation

## 2021-09-12 DIAGNOSIS — R059 Cough, unspecified: Secondary | ICD-10-CM

## 2021-09-12 DIAGNOSIS — J029 Acute pharyngitis, unspecified: Secondary | ICD-10-CM | POA: Diagnosis not present

## 2021-09-12 DIAGNOSIS — B9789 Other viral agents as the cause of diseases classified elsewhere: Secondary | ICD-10-CM

## 2021-09-12 DIAGNOSIS — Z8709 Personal history of other diseases of the respiratory system: Secondary | ICD-10-CM | POA: Insufficient documentation

## 2021-09-12 DIAGNOSIS — Z20822 Contact with and (suspected) exposure to covid-19: Secondary | ICD-10-CM | POA: Insufficient documentation

## 2021-09-12 LAB — KAPPA/LAMBDA LIGHT CHAINS
Kappa free light chain: 44.6 mg/L — ABNORMAL HIGH (ref 3.3–19.4)
Kappa, lambda light chain ratio: 1.54 (ref 0.26–1.65)
Lambda free light chains: 28.9 mg/L — ABNORMAL HIGH (ref 5.7–26.3)

## 2021-09-12 LAB — POCT RAPID STREP A (OFFICE): Rapid Strep A Screen: NEGATIVE

## 2021-09-12 MED ORDER — CETIRIZINE HCL 10 MG PO TABS: 10.0000 mg | ORAL_TABLET | Freq: Every day | ORAL | 0 refills | Status: DC

## 2021-09-12 MED ORDER — PREDNISONE 10 MG PO TABS: 30.0000 mg | ORAL_TABLET | Freq: Every day | ORAL | 0 refills | Status: DC

## 2021-09-12 NOTE — ED Provider Notes (Signed)
[place]   MRN: [id number] DOB: [birth year]  Subjective:   Morgan [name] is a 70 y.o. female presenting for 3-day history of acute onset persistent cough, dyspnea, throat pain.  Feels like she has difficulty swallowing.  Denies chest pain, fever, body aches.  She has a history of CKD stage III, CHF, CVA, COPD.  Was diagnosed with multiple myeloma yesterday.  Would like to have a COVID test.  No history of pulmonary embolism.  She does have a history of hoarseness, dysphagia, dyspnea.  Last echocardiogram from 2019 showed an ejection fraction of 55% to 65%  No current facility-administered medications for this encounter.  Current Outpatient Medications:    albuterol (VENTOLIN HFA) 108 (90 Base) MCG/ACT inhaler, Inhale 1-2 puffs into the lungs every 6 (six) hours as needed for wheezing or shortness of breath., Disp: 18 g, Rfl: 0   alendronate (FOSAMAX) 70 MG tablet, Take 1 tablet (70 mg total) by mouth every 7 (seven) days. Take with a full glass of water on an empty stomach., Disp: 4 tablet, Rfl: 11   aspirin-acetaminophen-caffeine (EXCEDRIN MIGRAINE) 250-250-65 MG tablet, Take 1-2 tablets by mouth every 6 (six) hours as needed for headache., Disp: , Rfl:    FLUoxetine (PROZAC) 40 MG capsule, Take 80 mg by mouth daily., Disp: , Rfl:    furosemide (LASIX) 40 MG tablet, Take 1 tablet (40 mg total) by mouth daily as needed for fluid or edema. (Patient taking differently: Take 40 mg by mouth 2 (two) times daily.), Disp: 30 tablet, Rfl:    gabapentin (NEURONTIN) 400 MG capsule, Take 400 mg by mouth 3 (three) times daily as needed (for nerve pain)., Disp: , Rfl:    hydrOXYzine (ATARAX/VISTARIL) 25 MG tablet, Take 25 mg by mouth every 8 (eight) hours as needed for anxiety., Disp: , Rfl:    ibuprofen (ADVIL) 600 MG tablet, Take 1 tablet (600 mg total) by mouth every 6 (six) hours as needed., Disp: 30 tablet, Rfl: 0   LINZESS 145 MCG CAPS capsule, TAKE 1 CAPSULE (145 MCG TOTAL) BY MOUTH  DAILY BEFORE BREAKFAST. (Patient taking differently: Take 145 mcg by mouth daily as needed (constipation.).), Disp: 30 capsule, Rfl: 3   mirtazapine (REMERON) 15 MG tablet, Take 15 mg by mouth at bedtime., Disp: , Rfl:    omeprazole (PRILOSEC) 40 MG capsule, TAKE 1 CAPSULE BY MOUTH EVERY DAY, Disp: 90 capsule, Rfl: 3   ondansetron (ZOFRAN-ODT) 4 MG disintegrating tablet, Take 1-2 tablets by mouth every 6 hours as needed, Disp: 360 tablet, Rfl: 3   oxyCODONE (ROXICODONE) 5 MG immediate release tablet, Take 1 tablet (5 mg total) by mouth every 8 (eight) hours as needed., Disp: 20 tablet, Rfl: 0   OXYGEN, Inhale 2 L/min into the lungs See admin instructions. 2 L/min at bedtime and as needed for shortness of breath throughout the day, Disp: , Rfl:    potassium chloride SA (KLOR-CON) 20 MEQ tablet, Take 1 tablet (20 mEq total) by mouth daily., Disp: 30 tablet, Rfl: 3   valsartan (DIOVAN) 320 MG tablet, Take 320 mg by mouth daily., Disp: , Rfl:    Allergies  Allergen Reactions   Meperidine Hives and Anaphylaxis   Strawberry Extract Hives and Swelling   Carafate [Sucralfate] Nausea Only and Other (See Comments)    Severe nausea   Wasp Venom Hives    Past Medical History:  Diagnosis Date   Allergy    Anemia    Anxiety    Arthritis  CHF (congestive heart failure) (HCC)    Depression    GERD (gastroesophageal reflux disease)    Hypertension    MVP (mitral valve prolapse)      Past Surgical History:  Procedure Laterality Date   ABDOMINAL HYSTERECTOMY     APPENDECTOMY     BALLOON DILATION N/A 04/21/2020   Procedure: BALLOON DILATION;  Surgeon: Milus Banister, MD;  Location: [facility];  Service: Endoscopy;  Laterality: N/A;  pyloric/ GI   BALLOON DILATION N/A 07/04/2020   Procedure: BALLOON DILATION;  Surgeon: Mauri Pole, MD;  [facility]: [facility];  Service: Endoscopy;  Laterality: N/A;   BIOPSY  04/21/2020   Procedure: BIOPSY;  Surgeon: Milus Banister, MD;  [facility];  Service: Endoscopy;;   BIOPSY  07/04/2020   Procedure: BIOPSY;  Surgeon: Mauri Pole, MD;  [facility];  Service: Endoscopy;;  EGD and COLON   BREAST SURGERY Right 1988   tumor removal   CHOLECYSTECTOMY N/A 05/18/2020   Procedure: LAPAROSCOPIC CHOLECYSTECTOMY;  Surgeon: Johnathan Hausen, MD;  [facility];  Service: General;  Laterality: N/A;   COLONOSCOPY WITH PROPOFOL N/A 07/04/2020   Procedure: COLONOSCOPY WITH PROPOFOL;  Surgeon: Mauri Pole, MD;  [facility];  Service: Endoscopy;  Laterality: N/A;   ESOPHAGEAL DILATION  08/11/2020   Procedure: PYLORIC DILATION;  Surgeon: Doran Stabler, MD;  [facility];  Service: Gastroenterology;;   ESOPHAGOGASTRODUODENOSCOPY (EGD) WITH PROPOFOL N/A 04/21/2020   Procedure: ESOPHAGOGASTRODUODENOSCOPY (EGD) WITH PROPOFOL;  Surgeon: Milus Banister, MD;  [facility];  Service: Endoscopy;  Laterality: N/A;   ESOPHAGOGASTRODUODENOSCOPY (EGD) WITH PROPOFOL N/A 07/04/2020   Procedure: ESOPHAGOGASTRODUODENOSCOPY (EGD) WITH PROPOFOL;  Surgeon: Mauri Pole, MD;  [facility];  Service: Endoscopy;  Laterality: N/A;   ESOPHAGOGASTRODUODENOSCOPY (EGD) WITH PROPOFOL N/A 08/11/2020   Procedure: ESOPHAGOGASTRODUODENOSCOPY (EGD) WITH PROPOFOL;  Surgeon: Doran Stabler, MD;  [facility];  Service: Gastroenterology;  Laterality: N/A;   KNEE SURGERY  2005   2 times left   KNEE SURGERY Right    KYPHOPLASTY N/A 07/20/2021   Procedure: T 10KYPHOPLASTY;  Surgeon: Hessie Knows, MD;  Location: [facility];  Service: Orthopedics;  Laterality: N/A;   KYPHOPLASTY N/A 08/23/2021   Procedure: T9 KYPHOPLASTY;  Surgeon: Hessie Knows, MD;  Location: [facility];  Service: Orthopedics;  Laterality: N/A;   POLYPECTOMY  07/04/2020   Procedure: POLYPECTOMY;  Surgeon: Mauri Pole, MD;  [facility];  Service: Endoscopy;;   TUBAL LIGATION      Family History  Problem  Relation Age of Onset   Breast cancer Mother 71   Ovarian cancer Mother 23   Brain cancer Mother    Hypertension Father    Hyperlipidemia Father    Heart disease Father    Colon cancer Father        diagnosed in his 32s   Breast cancer Maternal Grandmother 6   Diabetes Paternal Grandmother    Breast cancer Paternal Grandmother 30   Other Son        drug over dose   Colon cancer Maternal Grandfather        dx in his 51s   Liver disease Maternal Grandfather    Breast cancer Maternal Aunt 60   Breast cancer Maternal Aunt 60   Colon cancer Maternal Uncle    Stomach cancer Neg Hx    Pancreatic cancer Neg Hx    Esophageal cancer Neg Hx     Social  History   Tobacco Use   Smoking status: Former    Packs/day: 1.00    Years: 15.00    Pack years: 15.00    Types: Cigarettes    Quit date: 09/12/2017    Years since quitting: 4.0   Smokeless tobacco: Never  Vaping Use   Vaping Use: Never used  Substance Use Topics   Alcohol use: Not Currently   Drug use: No    ROS   Objective:   Vitals: BP (!) 178/96 (BP Location: Left Arm)   Pulse 79   Temp 98.2 F (36.8 C) (Oral)   Resp 16   SpO2 96%   Physical Exam Constitutional:      General: She is not in acute distress.    Appearance: Normal appearance. She is well-developed. She is not ill-appearing, toxic-appearing or diaphoretic.  HENT:     Head: Normocephalic and atraumatic.     Right Ear: External ear normal.     Left Ear: External ear normal.     Nose: Nose normal.     Mouth/Throat:     Mouth: Mucous membranes are moist.     Pharynx: No pharyngeal swelling, oropharyngeal exudate, posterior oropharyngeal erythema or uvula swelling.     Comments: Hoarseness of the voice noted.  Significant postnasal drainage overlying the pharynx. Eyes:     General: No scleral icterus.       Right eye: No discharge.        Left eye: No discharge.     Extraocular Movements: Extraocular movements intact.     Conjunctiva/sclera:  Conjunctivae normal.     Pupils: Pupils are equal, round, and reactive to light.  Cardiovascular:     Rate and Rhythm: Normal rate and regular rhythm.     Pulses: Normal pulses.     Heart sounds: Normal heart sounds. No murmur heard.   No friction rub. No gallop.  Pulmonary:     Effort: Pulmonary effort is normal. No respiratory distress.     Breath sounds: Normal breath sounds. No stridor. No wheezing, rhonchi or rales.  Skin:    General: Skin is warm and dry.     Findings: No rash.  Neurological:     General: No focal deficit present.     Mental Status: She is alert and oriented to person, place, and time.  Psychiatric:        Mood and Affect: Mood normal.        Behavior: Behavior normal.        Thought Content: Thought content normal.   Results for orders placed or performed during the hospital encounter of 09/12/21 (from the past 24 hour(s))  POCT rapid strep A     Status: None   Collection Time: 09/12/21  8:14 AM  Result Value Ref Range   Rapid Strep A Screen Negative Negative     DG Chest 2 View  Result Date: 09/12/2021 CLINICAL DATA:  Dyspnea.  Sore throat and cough. EXAM: CHEST - 2 VIEW COMPARISON:  08/28/2021 FINDINGS: The cardiomediastinal silhouette is unchanged with normal heart size. The lungs are hyperinflated with chronic interstitial coarsening. No airspace consolidation, overt pulmonary edema, pleural effusion, or pneumothorax is identified. Prior thoracic vertebral augmentation and an old sternal fracture are again noted. IMPRESSION: No active cardiopulmonary disease. Electronically Signed   By: Logan Bores M.D.   On: 09/12/2021 08:45     Results for orders placed or performed in visit on 09/11/21 (from the past 24 hour(s))  Technologist smear review  Status: None   Collection Time: 09/11/21 11:12 AM  Result Value Ref Range   WBC MORPHOLOGY Normal RBC, WBC, and platelet    RBC MORPHOLOGY Normal RBC, WBC, and platelet    Tech Review Normal RBC, WBC, and  platelet   Retic Panel     Status: Abnormal   Collection Time: 09/11/21 11:13 AM  Result Value Ref Range   Retic Ct Pct 0.9 0.4 - 3.1 %   RBC. 4.41 3.87 - 5.11 MIL/uL   Retic Count, Absolute 40.1 19.0 - 186.0 K/uL   Immature Retic Fract 7.3 2.3 - 15.9 %   Reticulocyte Hemoglobin 25.4 (L) >27.9 pg  Iron and TIBC     Status: Abnormal   Collection Time: 09/11/21 11:13 AM  Result Value Ref Range   Iron 24 (L) 28 - 170 ug/dL   TIBC 351 250 - 450 ug/dL   Saturation Ratios 7 (L) 10.4 - 31.8 %   UIBC 327 ug/dL  CBC with Differential/Platelet     Status: Abnormal   Collection Time: 09/11/21 11:13 AM  Result Value Ref Range   WBC 6.1 4.0 - 10.5 K/uL   RBC 4.48 3.87 - 5.11 MIL/uL   Hemoglobin 10.5 (L) 12.0 - 15.0 g/dL   HCT 35.5 (L) 36.0 - 46.0 %   MCV 79.2 (L) 80.0 - 100.0 fL   MCH 23.4 (L) 26.0 - 34.0 pg   MCHC 29.6 (L) 30.0 - 36.0 g/dL   RDW 15.6 (H) 11.5 - 15.5 %   Platelets 344 150 - 400 K/uL   nRBC 0.0 0.0 - 0.2 %   Neutrophils Relative % 53 %   Neutro Abs 3.2 1.7 - 7.7 K/uL   Lymphocytes Relative 38 %   Lymphs Abs 2.3 0.7 - 4.0 K/uL   Monocytes Relative 6 %   Monocytes Absolute 0.4 0.1 - 1.0 K/uL   Eosinophils Relative 3 %   Eosinophils Absolute 0.2 0.0 - 0.5 K/uL   Basophils Relative 0 %   Basophils Absolute 0.0 0.0 - 0.1 K/uL   Immature Granulocytes 0 %   Abs Immature Granulocytes 0.01 0.00 - 0.07 K/uL  Ferritin     Status: None   Collection Time: 09/11/21 11:13 AM  Result Value Ref Range   Ferritin 42 11 - 307 ng/mL    Assessment and Plan :   PDMP not reviewed this encounter.  1. Cough   2. Encounter for screening laboratory testing for COVID-19 virus   3. Dyspnea, unspecified type   4. Throat pain   5. Hoarseness   6. History of CHF (congestive heart failure)   7. History of COPD   8. Multiple myeloma, remission status unspecified ([place])     Will manage for viral illness such as viral URI, viral syndrome, viral rhinitis, COVID-19. Counseled patient on  nature of COVID-19 including modes of transmission, diagnostic testing, management and supportive care.  Offered scripts for symptomatic relief. COVID 19 testing and strep culture are pending. Counseled patient on potential for adverse effects with medications prescribed/recommended today, ER and return-to-clinic precautions discussed, patient verbalized understanding.     Jaynee Eagles, [place] 09/12/21 [phone]

## 2021-09-12 NOTE — Discharge Instructions (Addendum)
We will notify you of your COVID-19 test results as they arrive and may take between 48-72 hours.  I encourage you to sign up for MyChart if you have not already done so as this can be the easiest way for [place] to communicate results to you online or through a phone app.  Generally, we only contact you if it is a positive COVID result.  In the meantime, if you develop worsening symptoms including fever, chest pain, shortness of breath despite our current treatment plan then please report to the emergency room as this may be a sign of worsening status from possible COVID-19 infection.  Otherwise, we will manage this as a viral syndrome. For sore throat or cough try using a honey-based tea. Use 3 teaspoons of honey with juice squeezed from half lemon. Place shaved pieces of ginger into 1/2-1 cup of water and warm over stove top. Then mix the ingredients and repeat every 4 hours as needed. Please take Tylenol 500mg -650mg  every 6 hours for aches and pains, fevers. Hydrate very well with at least 2 liters of water. Eat light meals such as soups to replenish electrolytes and soft fruits, veggies. Start an antihistamine like Zyrtec for postnasal drainage, sinus congestion.

## 2021-09-12 NOTE — ED Triage Notes (Signed)
Sore throat starting Sunday, cough, hard to swallow, "can't swallow, it gets stuck. I can't walk across the room without getting out of breath." Dx with myeloma yesterday.

## 2021-09-13 LAB — NOVEL CORONAVIRUS, NAA: SARS-CoV-2, NAA: NOT DETECTED

## 2021-09-13 LAB — SARS-COV-2, NAA 2 DAY TAT

## 2021-09-14 ENCOUNTER — Ambulatory Visit: Payer: Medicare HMO

## 2021-09-14 LAB — MULTIPLE MYELOMA PANEL, SERUM
Albumin SerPl Elph-Mcnc: 3.4 g/dL (ref 2.9–4.4)
Albumin/Glob SerPl: 1.1 (ref 0.7–1.7)
Alpha 1: 0.2 g/dL (ref 0.0–0.4)
Alpha2 Glob SerPl Elph-Mcnc: 1 g/dL (ref 0.4–1.0)
B-Globulin SerPl Elph-Mcnc: 1.1 g/dL (ref 0.7–1.3)
Gamma Glob SerPl Elph-Mcnc: 1.1 g/dL (ref 0.4–1.8)
Globulin, Total: 3.4 g/dL (ref 2.2–3.9)
IgA: 201 mg/dL (ref 87–352)
IgG (Immunoglobin G), Serum: 1157 mg/dL (ref 586–1602)
IgM (Immunoglobulin M), Srm: 111 mg/dL (ref 26–217)
Total Protein ELP: 6.8 g/dL (ref 6.0–8.5)

## 2021-09-15 LAB — CULTURE, GROUP A STREP (THRC)

## 2021-09-18 ENCOUNTER — Telehealth: Payer: Self-pay | Admitting: Pharmacist

## 2021-09-18 ENCOUNTER — Ambulatory Visit
Admission: RE | Admit: 2021-09-18 | Discharge: 2021-09-18 | Disposition: A | Discharge status: Home / Self Care | Payer: Medicare HMO | Source: Ambulatory Visit | Attending: Gastroenterology | Admitting: Gastroenterology

## 2021-09-18 ENCOUNTER — Other Ambulatory Visit: Payer: Self-pay

## 2021-09-18 DIAGNOSIS — R1310 Dysphagia, unspecified: Secondary | ICD-10-CM | POA: Insufficient documentation

## 2021-09-18 DIAGNOSIS — K224 Dyskinesia of esophagus: Secondary | ICD-10-CM | POA: Diagnosis not present

## 2021-09-18 DIAGNOSIS — K219 Gastro-esophageal reflux disease without esophagitis: Secondary | ICD-10-CM | POA: Diagnosis not present

## 2021-09-18 NOTE — Progress Notes (Signed)
Chronic Care Management Pharmacy Assistant   Name: Morgan [name]  MRN: [id number] DOB: 1951-11-03  Morgan [name] is an 70 y.o. year old female who presents for his initial CCM visit with the clinical pharmacist.  Reason for Encounter: Initial Questions for CPP    Recent office visits:  08/28/21 [name], MD- PCP (Chronic congestive heart failure, unspecified heart failure type) Orders: Labs, med changes: alendronate sodium 70 mg, and potassium chl 20 meq,  increase Lasix to 40 mg twice daily for 5 days  06/26/21 [name], MD- PCP (Fall and back pain) meds:oxycodone 5 mg  Recent consult visits:  09/12/21 (Sore Throat) [facility], Meds: cetirizine 10 mg daily, and prednisone 30 mg daily  09/11/21 [name], MD-Oncology (iron deficiency anemia) no med  Changes  09/07/21 [name], MD-Orthopedic Surgery / Orthopaedics ([place] compression fracture of T9-T10 vertebra) [facility]  09/06/21  Locklear, [name], MD (Abd pain, diarrhea, dysphagia and nausea)[facility] 07/31/21 [name], MD  [facility] 8/3/22Menz, Unice Cobble, MD  [facility] 07/05/21 [name], [place]  [facility] 05/11/21 Flossie Dibble, MD   [facility] visits:  Medication Reconciliation was completed by comparing discharge summary, patient's EMR and Pharmacy list, and upon discussion with patient.  Admitted to the hospital on 08/18/21 due to back pain. Discharge date was 08/23/21. Discharged from [facility].    Medications that remain the same after Hospital Discharge:??  -All other medications will remain the same.  Admitted to the hospital on 07/20/21 due to back pain. Discharge date was 07/20/21. Discharged from  [facility].    Change: lasix, Linzess, omeprazole Stop: abilfy, metamucil, potassium, sucralfate and tizanidine  Admitted to the hospital on 07/01/21 due to fall. Discharge date was 07/01/21. Discharged from [facility]  Admitted to the hospital on 03/26/21 due to knee pain.  Discharge date was 03/26/21. Discharged from [facility] ED  Started: oxycodone 5 mg Medications: Outpatient Encounter Medications as of 09/18/2021  Medication Sig   albuterol (VENTOLIN HFA) 108 (90 Base) MCG/ACT inhaler Inhale 1-2 puffs into the lungs every 6 (six) hours as needed for wheezing or shortness of breath.   alendronate (FOSAMAX) 70 MG tablet Take 1 tablet (70 mg total) by mouth every 7 (seven) days. Take with a full glass of water on an empty stomach.   aspirin-acetaminophen-caffeine (EXCEDRIN MIGRAINE) 250-250-65 MG tablet Take 1-2 tablets by mouth every 6 (six) hours as needed for headache.   cetirizine (ZYRTEC ALLERGY) 10 MG tablet Take 1 tablet (10 mg total) by mouth daily.   FLUoxetine (PROZAC) 40 MG capsule Take 80 mg by mouth daily.   furosemide (LASIX) 40 MG tablet Take 1 tablet (40 mg total) by mouth daily as needed for fluid or edema. (Patient taking differently: Take 40 mg by mouth 2 (two) times daily.)   gabapentin (NEURONTIN) 400 MG capsule Take 400 mg by mouth 3 (three) times daily as needed (for nerve pain).   hydrOXYzine (ATARAX/VISTARIL) 25 MG tablet Take 25 mg by mouth every 8 (eight) hours as needed for anxiety.   ibuprofen (ADVIL) 600 MG tablet Take 1 tablet (600 mg total) by mouth every 6 (six) hours as needed.   LINZESS 145 MCG CAPS capsule TAKE 1 CAPSULE (145 MCG TOTAL) BY  MOUTH DAILY BEFORE BREAKFAST. (Patient taking differently: Take 145 mcg by mouth daily as needed (constipation.).)   mirtazapine (REMERON) 15 MG tablet Take 15 mg by mouth at bedtime.   omeprazole (PRILOSEC) 40 MG capsule TAKE 1 CAPSULE BY MOUTH EVERY DAY    ondansetron (ZOFRAN-ODT) 4 MG disintegrating tablet Take 1-2 tablets by mouth every 6 hours as needed   oxyCODONE (ROXICODONE) 5 MG immediate release tablet Take 1 tablet (5 mg total) by mouth every 8 (eight) hours as needed.   OXYGEN Inhale 2 L/min into the lungs See admin instructions. 2 L/min at bedtime and as needed for shortness of breath throughout the day   potassium chloride SA (KLOR-CON) 20 MEQ tablet Take 1 tablet (20 mEq total) by mouth daily.   predniSONE (DELTASONE) 10 MG tablet Take 3 tablets (30 mg total) by mouth daily with breakfast.   valsartan (DIOVAN) 320 MG tablet Take 320 mg by mouth daily.   No facility-administered encounter medications on file as of 09/18/2021.   Have you seen any other providers since your last visit? Yes, patient states that she last seen Dr. Rudene Christians and Dr. Haig Prophet last month  Any changes in your medications or health? No, patient states no changes were made to medications  Any side effects from any medications? No, patient states no side effects from medications  Do you have an symptoms or problems not managed by your medications? Yes, patient states that she has diarrhea but sometimes try to take imodium to help   Any concerns about your health right now? No  Has your provider asked that you check blood pressure, blood sugar, or follow special diet at home? No  Do you get any type of exercise on a regular basis? Patient states because of her sob and fatigue she does not exercise  Can you think of a goal you would like to reach for your health? Patient would like to get as healthy as she can so she can see her grandson who she has custody of to grow up  Do you have any problems getting your medications? No  Is there anything that you would like to discuss during the appointment? Patient states not at this time  Please bring medications and supplements to appointment   [place] Pharmacist Assistant [phone]

## 2021-09-19 ENCOUNTER — Telehealth: Payer: Self-pay | Admitting: *Deleted

## 2021-09-19 NOTE — Telephone Encounter (Signed)
Patient also came in yesterday. Dr. Tasia Catchings said it was ok to schedule a virtual visit to go over resutls but Winferd Humphrey tried calling her yesterday and was unable to reach her. Jenn, will you call her back and she if she is willing to do virtual visit.

## 2021-09-19 NOTE — Telephone Encounter (Signed)
Patient called asking if results of the Myeloma test are back and would like a return call to let her know meaning of results, I told her that it looks like Myeloma panel is normal, but light chains are elevated  Notes Component Ref Range & Units 8 d ago   IgG (Immunoglobin G), Serum 586 - 1,602 mg/dL 1,157   IgA 87 - 352 mg/dL 201   IgM (Immunoglobulin M), Srm 26 - 217 mg/dL 111   Total Protein ELP 6.0 - 8.5 g/dL 6.8 VC   Albumin SerPl Elph-Mcnc 2.9 - 4.4 g/dL 3.4 VC   Alpha 1 0.0 - 0.4 g/dL 0.2 VC   Alpha2 Glob SerPl Elph-Mcnc 0.4 - 1.0 g/dL 1.0 VC   B-Globulin SerPl Elph-Mcnc 0.7 - 1.3 g/dL 1.1 VC   Gamma Glob SerPl Elph-Mcnc 0.4 - 1.8 g/dL 1.1 VC   M Protein SerPl Elph-Mcnc Not Observed g/dL Not Observed VC   Globulin, Total 2.2 - 3.9 g/dL 3.4 VC   Albumin/Glob SerPl 0.7 - 1.7 1.1 VC   IFE 1  Comment VC   Comment: (NOTE)  The immunofixation pattern appears unremarkable. Evidence of  monoclonal protein is not apparent.   Please Note  Comment VC   Comment: (NOTE)  Protein electrophoresis scan will follow via computer, mail, or  courier delivery.  Performed At: [facility]  [place], [place] [id number]  Rush Farmer MD QP:[id number]   Resulting Agency  [facility]         Specimen Collected: 09/11/21 11:13 Last Resulted: 09/14/21 16:37      Lab Flowsheet    Order Details    View Encounter    Lab and Collection Details    Routing    Result History    View Encounter Conversation      VC=Value has a corrected status       Result Care Coordination   Patient Communication   Add Comments   Not seen Back to Top       Other Results from 09/11/2021   Contains abnormal data Kappa/lambda light chains Order: [id number] Status: Final result   Visible to patient: No (inaccessible in [place])   Next appt: 09/20/2021 at 09:00 AM in Family Medicine ([facility] CCM PHARMACIST 2)   Dx: Microcytic anemia   0 Result Notes Component Ref Range & Units 8 d  ago   Kappa free light chain 3.3 - 19.4 mg/L 44.6 High    Lambda free light chains 5.7 - 26.3 mg/L 28.9 High    Kappa, lambda light chain ratio 0.26 - 1.65 1.54   Comment: (NOTE)  Performed At: [facility]  [address] [place], [place] [id number]  Rush Farmer MD JQ:[id number]   Resulting Agency  [facility]         Specimen Collected: 09/11/21 11:13 Last Resulted: 09/12/21 14:37

## 2021-09-19 NOTE — Telephone Encounter (Signed)
Jenn, can you move up appts to this week

## 2021-09-20 ENCOUNTER — Inpatient Hospital Stay: Payer: Medicare HMO | Attending: Oncology | Admitting: Oncology

## 2021-09-20 ENCOUNTER — Inpatient Hospital Stay: Payer: Medicare HMO

## 2021-09-20 ENCOUNTER — Other Ambulatory Visit: Payer: Self-pay

## 2021-09-20 ENCOUNTER — Ambulatory Visit (INDEPENDENT_AMBULATORY_CARE_PROVIDER_SITE_OTHER): Payer: Medicare HMO

## 2021-09-20 ENCOUNTER — Encounter: Payer: Self-pay | Admitting: Oncology

## 2021-09-20 ENCOUNTER — Ambulatory Visit (INDEPENDENT_AMBULATORY_CARE_PROVIDER_SITE_OTHER): Payer: Medicare HMO | Admitting: Pharmacist

## 2021-09-20 ENCOUNTER — Ambulatory Visit
Admission: EM | Admit: 2021-09-20 | Discharge: 2021-09-20 | Disposition: A | Discharge status: Home / Self Care | Payer: Medicare HMO | Attending: Urgent Care | Admitting: Urgent Care

## 2021-09-20 VITALS — BP 171/93 | HR 77 | Temp 99.1°F | Resp 18 | Wt 235.1 lb

## 2021-09-20 VITALS — BP 178/98 | HR 78 | Resp 16

## 2021-09-20 DIAGNOSIS — Z803 Family history of malignant neoplasm of breast: Secondary | ICD-10-CM | POA: Insufficient documentation

## 2021-09-20 DIAGNOSIS — F419 Anxiety disorder, unspecified: Secondary | ICD-10-CM

## 2021-09-20 DIAGNOSIS — J018 Other acute sinusitis: Secondary | ICD-10-CM | POA: Diagnosis not present

## 2021-09-20 DIAGNOSIS — I5032 Chronic diastolic (congestive) heart failure: Secondary | ICD-10-CM

## 2021-09-20 DIAGNOSIS — I129 Hypertensive chronic kidney disease with stage 1 through stage 4 chronic kidney disease, or unspecified chronic kidney disease: Secondary | ICD-10-CM | POA: Diagnosis not present

## 2021-09-20 DIAGNOSIS — Z8673 Personal history of transient ischemic attack (TIA), and cerebral infarction without residual deficits: Secondary | ICD-10-CM

## 2021-09-20 DIAGNOSIS — D631 Anemia in chronic kidney disease: Secondary | ICD-10-CM | POA: Insufficient documentation

## 2021-09-20 DIAGNOSIS — Z800 Family history of malignant neoplasm of digestive organs: Secondary | ICD-10-CM | POA: Insufficient documentation

## 2021-09-20 DIAGNOSIS — N1831 Chronic kidney disease, stage 3a: Secondary | ICD-10-CM | POA: Insufficient documentation

## 2021-09-20 DIAGNOSIS — D509 Iron deficiency anemia, unspecified: Secondary | ICD-10-CM | POA: Diagnosis not present

## 2021-09-20 DIAGNOSIS — I509 Heart failure, unspecified: Secondary | ICD-10-CM | POA: Insufficient documentation

## 2021-09-20 DIAGNOSIS — Z8041 Family history of malignant neoplasm of ovary: Secondary | ICD-10-CM | POA: Diagnosis not present

## 2021-09-20 DIAGNOSIS — R059 Cough, unspecified: Secondary | ICD-10-CM

## 2021-09-20 DIAGNOSIS — R5383 Other fatigue: Secondary | ICD-10-CM | POA: Insufficient documentation

## 2021-09-20 DIAGNOSIS — D508 Other iron deficiency anemias: Secondary | ICD-10-CM

## 2021-09-20 DIAGNOSIS — I10 Essential (primary) hypertension: Secondary | ICD-10-CM

## 2021-09-20 DIAGNOSIS — K219 Gastro-esophageal reflux disease without esophagitis: Secondary | ICD-10-CM | POA: Diagnosis not present

## 2021-09-20 DIAGNOSIS — Z87891 Personal history of nicotine dependence: Secondary | ICD-10-CM | POA: Diagnosis not present

## 2021-09-20 DIAGNOSIS — E782 Mixed hyperlipidemia: Secondary | ICD-10-CM

## 2021-09-20 DIAGNOSIS — F32A Depression, unspecified: Secondary | ICD-10-CM

## 2021-09-20 DIAGNOSIS — J449 Chronic obstructive pulmonary disease, unspecified: Secondary | ICD-10-CM | POA: Diagnosis not present

## 2021-09-20 DIAGNOSIS — R051 Acute cough: Secondary | ICD-10-CM

## 2021-09-20 DIAGNOSIS — Z8679 Personal history of other diseases of the circulatory system: Secondary | ICD-10-CM

## 2021-09-20 DIAGNOSIS — K449 Diaphragmatic hernia without obstruction or gangrene: Secondary | ICD-10-CM | POA: Diagnosis not present

## 2021-09-20 DIAGNOSIS — R1310 Dysphagia, unspecified: Secondary | ICD-10-CM

## 2021-09-20 DIAGNOSIS — I341 Nonrheumatic mitral (valve) prolapse: Secondary | ICD-10-CM | POA: Insufficient documentation

## 2021-09-20 DIAGNOSIS — Z79899 Other long term (current) drug therapy: Secondary | ICD-10-CM | POA: Insufficient documentation

## 2021-09-20 DIAGNOSIS — R69 Illness, unspecified: Secondary | ICD-10-CM | POA: Diagnosis not present

## 2021-09-20 MED ORDER — SODIUM CHLORIDE 0.9 % IV SOLN: 200.0000 mg | Freq: Once | INTRAVENOUS | Status: DC

## 2021-09-20 MED ORDER — SODIUM CHLORIDE 0.9 % IV SOLN
Freq: Once | INTRAVENOUS | Status: AC
  Filled 2021-09-20: qty 250

## 2021-09-20 MED ORDER — AMOXICILLIN-POT CLAVULANATE 875-125 MG PO TABS: 1.0000 | ORAL_TABLET | Freq: Two times a day (BID) | ORAL | 0 refills | Status: DC

## 2021-09-20 MED ORDER — IRON SUCROSE 20 MG/ML IV SOLN
200.0000 mg | Freq: Once | INTRAVENOUS | Status: AC
  Administered 2021-09-20: 200 mg via INTRAVENOUS
  Filled 2021-09-20: qty 10

## 2021-09-20 MED ORDER — PREDNISONE 20 MG PO TABS: ORAL_TABLET | ORAL | 0 refills | Status: DC

## 2021-09-20 NOTE — Progress Notes (Signed)
Hematology/Oncology progress note [facility] Telephone:([phone] [phone]   Patient Care Team: [name], MD as PCP - General (Family Medicine) Charlton Haws, [facility] as Pharmacist (Pharmacist)  REFERRING PROVIDER: [name], MD  CHIEF COMPLAINTS/REASON FOR VISIT:  Follow-up for iron deficiency anemia  HISTORY OF PRESENTING ILLNESS:   Morgan [name] is a  70 y.o.  female with PMH listed below was seen in consultation at the request of  [name], MD  for evaluation of anemia Patient had blood work done recently on 08/29/2021. CBC showed a hemoglobin of 9.9, MCV 75.5. Patient denies any black stool or blood in the stool. She has a history of gastric stenosis for which she got EGD dilation periodically.  Last EGD was 08/11/2020. Patient reports feeling tired and fatigued. She has taken oral iron supplementation before.  Iron has made her constipation worse.  INTERVAL HISTORY Morgan [name] is a 71 y.o. female who has above history reviewed by me today presents for follow up visit for management of iron deficiency anemia Patient has blood work done and present to discuss results and management plan. + Fatigue.   Review of Systems  Constitutional:  Positive for fatigue. Negative for appetite change, chills and fever.  HENT:   Negative for hearing loss and voice change.   Eyes:  Negative for eye problems.  Respiratory:  Negative for chest tightness and cough.   Cardiovascular:  Negative for chest pain.  Gastrointestinal:  Negative for abdominal distention, abdominal pain and blood in stool.  Endocrine: Negative for hot flashes.  Genitourinary:  Negative for difficulty urinating and frequency.   Musculoskeletal:  Negative for arthralgias.  Skin:  Negative for itching and rash.  Neurological:  Negative for extremity weakness.  Hematological:  Negative for adenopathy.  Psychiatric/Behavioral:  Negative for confusion.    MEDICAL  HISTORY:  Past Medical History:  Diagnosis Date   Allergy    Anemia    Anxiety    Arthritis    CHF (congestive heart failure) (HCC)    Depression    GERD (gastroesophageal reflux disease)    Hypertension    MVP (mitral valve prolapse)     SURGICAL HISTORY: Past Surgical History:  Procedure Laterality Date   ABDOMINAL HYSTERECTOMY     APPENDECTOMY     BALLOON DILATION N/A 04/21/2020   Procedure: BALLOON DILATION;  Surgeon: Milus Banister, MD;  Location: [facility];  Service: Endoscopy;  Laterality: N/A;  pyloric/ GI   BALLOON DILATION N/A 07/04/2020   Procedure: BALLOON DILATION;  Surgeon: Mauri Pole, MD;  Location: [facility];  Service: Endoscopy;  Laterality: N/A;   BIOPSY  04/21/2020   Procedure: BIOPSY;  Surgeon: Milus Banister, MD;  Location: [facility];  Service: Endoscopy;;   BIOPSY  07/04/2020   Procedure: BIOPSY;  Surgeon: Mauri Pole, MD;  Location: [facility];  Service: Endoscopy;;  EGD and COLON   BREAST SURGERY Right 1988   tumor removal   CHOLECYSTECTOMY N/A 05/18/2020   Procedure: LAPAROSCOPIC CHOLECYSTECTOMY;  Surgeon: Johnathan Hausen, MD;  Location: [facility];  Service: General;  Laterality: N/A;   COLONOSCOPY WITH PROPOFOL N/A 07/04/2020   Procedure: COLONOSCOPY WITH PROPOFOL;  Surgeon: Mauri Pole, MD;  Location: [facility];  Service: Endoscopy;  Laterality: N/A;   ESOPHAGEAL DILATION  08/11/2020   Procedure: PYLORIC DILATION;  Surgeon: Doran Stabler, MD;  Location: [facility];  Service: Gastroenterology;;   ESOPHAGOGASTRODUODENOSCOPY (EGD) WITH PROPOFOL N/A 04/21/2020  Procedure: ESOPHAGOGASTRODUODENOSCOPY (EGD) WITH PROPOFOL;  Surgeon: Milus Banister, MD;  Location: [facility];  Service: Endoscopy;  Laterality: N/A;   ESOPHAGOGASTRODUODENOSCOPY (EGD) WITH PROPOFOL N/A 07/04/2020   Procedure: ESOPHAGOGASTRODUODENOSCOPY (EGD) WITH PROPOFOL;  Surgeon: Mauri Pole, MD;  [facility]: [facility];  Service: Endoscopy;   Laterality: N/A;   ESOPHAGOGASTRODUODENOSCOPY (EGD) WITH PROPOFOL N/A 08/11/2020   Procedure: ESOPHAGOGASTRODUODENOSCOPY (EGD) WITH PROPOFOL;  Surgeon: Doran Stabler, MD;  Location: [facility];  Service: Gastroenterology;  Laterality: N/A;   KNEE SURGERY  2005   2 times left   KNEE SURGERY Right    KYPHOPLASTY N/A 07/20/2021   Procedure: T 10KYPHOPLASTY;  Surgeon: Hessie Knows, MD;  Location: [facility];  Service: Orthopedics;  Laterality: N/A;   KYPHOPLASTY N/A 08/23/2021   Procedure: T9 KYPHOPLASTY;  Surgeon: Hessie Knows, MD;  Location: [facility];  Service: Orthopedics;  Laterality: N/A;   POLYPECTOMY  07/04/2020   Procedure: POLYPECTOMY;  Surgeon: Mauri Pole, MD;  Location: [facility];  Service: Endoscopy;;   TUBAL LIGATION      SOCIAL HISTORY: Social History   Socioeconomic History   Marital status: Single    Spouse name: Not on file   Number of children: 2   Years of education: Not on file   Highest education level: Not on file  Occupational History   Not on file  Tobacco Use   Smoking status: Former    Packs/day: 1.00    Years: 15.00    Pack years: 15.00    Types: Cigarettes    Quit date: 09/12/2017    Years since quitting: 4.0   Smokeless tobacco: Never  Vaping Use   Vaping Use: Never used  Substance and Sexual Activity   Alcohol use: Not Currently   Drug use: No   Sexual activity: Not Currently  Other Topics Concern   Not on file  Social History Narrative   Lives with Grandson's great grandparents    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)   Daughter - [name] - lives in the [place] - family, aunt, [name] and [name] (who she lives with), brother   Transportation: roommates   Enjoy: [employer], playing with grandson   Exercise: PT exercises   Diet: good - chicken, some steak, tries to eat healthy, avoids fried foods   Retired from being a Materials engineer of Radio broadcast assistant Strain: Not on Art therapist  Insecurity: Not on file  Transportation Needs: Not on file  Physical Activity: Not on file  Stress: Not on file  Social Connections: Not on file  Intimate Partner Violence: Not on file    FAMILY HISTORY: Family History  Problem Relation Age of Onset   Breast cancer Mother 49   Ovarian cancer Mother 32   Brain cancer Mother    Hypertension Father    Hyperlipidemia Father    Heart disease Father    Colon cancer Father        diagnosed in his 18s   Breast cancer Maternal Grandmother 94   Diabetes Paternal Grandmother    Breast cancer Paternal Grandmother 49   Other Son        drug over dose   Colon cancer Maternal Grandfather        dx in his 57s   Liver disease Maternal Grandfather    Breast cancer Maternal Aunt 60   Breast cancer Maternal Aunt 60   Colon cancer Maternal Uncle  Stomach cancer Neg Hx    Pancreatic cancer Neg Hx    Esophageal cancer Neg Hx     ALLERGIES:  is allergic to meperidine, strawberry extract, carafate [sucralfate], and wasp venom.  MEDICATIONS:  Current Outpatient Medications  Medication Sig Dispense Refill   albuterol (VENTOLIN HFA) 108 (90 Base) MCG/ACT inhaler Inhale 1-2 puffs into the lungs every 6 (six) hours as needed for wheezing or shortness of breath. 18 g 0   alendronate (FOSAMAX) 70 MG tablet Take 1 tablet (70 mg total) by mouth every 7 (seven) days. Take with a full glass of water on an empty stomach. 4 tablet 11   cetirizine (ZYRTEC ALLERGY) 10 MG tablet Take 1 tablet (10 mg total) by mouth daily. 30 tablet 0   FLUoxetine (PROZAC) 40 MG capsule Take 40 mg by mouth daily.     furosemide (LASIX) 40 MG tablet Take 1 tablet (40 mg total) by mouth daily as needed for fluid or edema. (Patient taking differently: Take 40 mg by mouth 2 (two) times daily.) 30 tablet    gabapentin (NEURONTIN) 400 MG capsule Take 400 mg by mouth 3 (three) times daily as needed (for nerve pain).     hydrOXYzine (ATARAX/VISTARIL) 25 MG tablet Take 25 mg by mouth  every 8 (eight) hours as needed for anxiety.     mirtazapine (REMERON) 15 MG tablet Take 15 mg by mouth at bedtime.     omeprazole (PRILOSEC) 40 MG capsule TAKE 1 CAPSULE BY MOUTH EVERY DAY (Patient taking differently: Take 80 mg by mouth daily.) 90 capsule 3   ondansetron (ZOFRAN-ODT) 4 MG disintegrating tablet Take 1-2 tablets by mouth every 6 hours as needed 360 tablet 3   OXYGEN Inhale 2 L/min into the lungs See admin instructions. 2 L/min at bedtime and as needed for shortness of breath throughout the day     potassium chloride SA (KLOR-CON) 20 MEQ tablet Take 1 tablet (20 mEq total) by mouth daily. (Patient taking differently: Take 20 mEq by mouth daily. With furosemide) 30 tablet 3   valsartan (DIOVAN) 320 MG tablet Take 320 mg by mouth daily.     amoxicillin-clavulanate (AUGMENTIN) 875-125 MG tablet Take 1 tablet by mouth every 12 (twelve) hours. 14 tablet 0   aspirin-acetaminophen-caffeine (EXCEDRIN MIGRAINE) 250-250-65 MG tablet Take 1-2 tablets by mouth every 6 (six) hours as needed for headache. (Patient not taking: Reported on 09/20/2021)     ibuprofen (ADVIL) 600 MG tablet Take 1 tablet (600 mg total) by mouth every 6 (six) hours as needed. (Patient not taking: Reported on 09/20/2021) 30 tablet 0   predniSONE (DELTASONE) 20 MG tablet Take 2 tablets daily with breakfast. 10 tablet 0   No current facility-administered medications for this visit.     PHYSICAL EXAMINATION: ECOG PERFORMANCE STATUS: 1 - Symptomatic but completely ambulatory Vitals:   09/20/21 1339  BP: (!) 171/93  Pulse: 77  Resp: 18  Temp: 99.1 F (37.3 C)   Filed Weights   09/20/21 1339  Weight: 235 lb 1.6 oz (106.6 kg)    Physical Exam Constitutional:      General: She is not in acute distress. HENT:     Head: Normocephalic and atraumatic.  Eyes:     General: No scleral icterus. Cardiovascular:     Rate and Rhythm: Normal rate and regular rhythm.     Heart sounds: Normal heart sounds.  Pulmonary:      Effort: Pulmonary effort is normal. No respiratory distress.     Breath  sounds: No wheezing.  Abdominal:     General: Bowel sounds are normal. There is no distension.     Palpations: Abdomen is soft.  Musculoskeletal:        General: No deformity. Normal range of motion.     Cervical back: Normal range of motion and neck supple.  Skin:    General: Skin is warm and dry.     Findings: No erythema or rash.  Neurological:     Mental Status: She is alert and oriented to person, place, and time. Mental status is at baseline.     Cranial Nerves: No cranial nerve deficit.     Coordination: Coordination normal.  Psychiatric:        Mood and Affect: Mood normal.    LABORATORY DATA:  I have reviewed the data as listed Lab Results  Component Value Date   WBC 6.1 09/11/2021   HGB 10.5 (L) 09/11/2021   HCT 35.5 (L) 09/11/2021   MCV 79.2 (L) 09/11/2021   PLT 344 09/11/2021   Recent Labs    10/20/20 1511 02/05/21 0741 02/20/21 1637 08/29/21 0931  NA 139 141 140 141  K 4.2 2.9* 3.3* 3.2*  CL 99 98 102 93*  CO2 30 30 30  39*  GLUCOSE 108* 120* 103* 116*  BUN 27* 13 20 16   CREATININE 1.44* 1.08* 1.10* 1.01  CALCIUM 9.0 8.7* 8.4* 9.0  GFRNONAA  --  56* 55*  --   PROT 6.7 6.0*  --  6.5  ALBUMIN 3.7 3.0*  --  3.5  AST 18 16  --  15  ALT 11 12  --  9  ALKPHOS 109 109  --  152*  BILITOT 0.2 0.3  --  0.5    Iron/TIBC/Ferritin/ %Sat    Component Value Date/Time   IRON 24 (L) 09/11/2021 1113   TIBC 351 09/11/2021 1113   FERRITIN 42 09/11/2021 1113   IRONPCTSAT 7 (L) 09/11/2021 1113       RADIOGRAPHIC STUDIES: I have personally reviewed the radiological images as listed and agreed with the findings in the report. DG Chest 2 View  Result Date: 09/20/2021 CLINICAL DATA:  Cough. EXAM: CHEST - 2 VIEW COMPARISON:  Chest x-ray 09/12/2021. FINDINGS: The heart size and mediastinal contours are within normal limits. Both lungs are clear. Multiple thoracic level vertebroplasty changes  are again noted. There is stable scarring in both lung apices. IMPRESSION: No active cardiopulmonary disease. Electronically Signed   By: Ronney Asters M.D.   On: 09/20/2021 16:43   DG Chest 2 View  Result Date: 09/12/2021 CLINICAL DATA:  Dyspnea.  Sore throat and cough. EXAM: CHEST - 2 VIEW COMPARISON:  08/28/2021 FINDINGS: The cardiomediastinal silhouette is unchanged with normal heart size. The lungs are hyperinflated with chronic interstitial coarsening. No airspace consolidation, overt pulmonary edema, pleural effusion, or pneumothorax is identified. Prior thoracic vertebral augmentation and an old sternal fracture are again noted. IMPRESSION: No active cardiopulmonary disease. Electronically Signed   By: Logan Bores M.D.   On: 09/12/2021 08:45   DG Chest 2 View  Result Date: 08/29/2021 CLINICAL DATA:  Shortness of breath and chest pain following recent fall, initial encounter EXAM: CHEST - 2 VIEW COMPARISON:  06/12/2021 FINDINGS: Cardiac shadow is stable. Mild aortic calcifications are noted. Mild interstitial changes are noted bilaterally stable from the prior exam. Previously seen peripheral infiltrate on the right has resolved in the interval. No definitive rib deformities are identified. Changes of prior vertebral augmentation are seen new from  the prior study. Healed sternal fracture is again identified stable from prior CT examination. IMPRESSION: Chronic interstitial changes. No acute bony abnormality is noted. Electronically Signed   By: Inez Catalina M.D.   On: 08/29/2021 09:00   DG Thoracic Spine 2 View  Result Date: 08/23/2021 CLINICAL DATA:  Surgery.  T9 kyphoplasty. EXAM: DG C-ARM 1-60 MIN; THORACIC SPINE 2 VIEWS FLUOROSCOPY TIME:  Radiation Exposure Index (if provided by the fluoroscopic device): 4.4 mGy. Number of Acquired Spot Images: 2 COMPARISON:  None. FINDINGS: Two C-arm fluoroscopic images were obtained intraoperatively and submitted for post operative interpretation. These  images demonstrate interval T9 kyphoplasty with some extravasation of cement into the T9-T10 disc space. Prior T10 kyphoplasty. Please see the performing provider's procedural report for further detail. IMPRESSION: Intraoperative fluoroscopy, as detailed above. Electronically Signed   By: Margaretha Sheffield M.D.   On: 08/23/2021 16:38   DG C-Arm 1-60 Min  Result Date: 08/23/2021 CLINICAL DATA:  Surgery.  T9 kyphoplasty. EXAM: DG C-ARM 1-60 MIN; THORACIC SPINE 2 VIEWS FLUOROSCOPY TIME:  Radiation Exposure Index (if provided by the fluoroscopic device): 4.4 mGy. Number of Acquired Spot Images: 2 COMPARISON:  None. FINDINGS: Two C-arm fluoroscopic images were obtained intraoperatively and submitted for post operative interpretation. These images demonstrate interval T9 kyphoplasty with some extravasation of cement into the T9-T10 disc space. Prior T10 kyphoplasty. Please see the performing provider's procedural report for further detail. IMPRESSION: Intraoperative fluoroscopy, as detailed above. Electronically Signed   By: Margaretha Sheffield M.D.   On: 08/23/2021 16:38   DG ESOPHAGUS W SINGLE CM (SOL OR THIN BA)  Result Date: 09/18/2021 CLINICAL DATA:  Dysphagia, difficulty dry swallowing, feels like food getting stuck in throat EXAM: ESOPHOGRAM / BARIUM SWALLOW / BARIUM TABLET STUDY TECHNIQUE: Combined double contrast and single contrast examination performed using effervescent crystals, thick barium liquid, and thin barium liquid. The patient was observed with fluoroscopy swallowing a 13 mm barium sulphate tablet. FLUOROSCOPY TIME:  Fluoroscopy Time:  5.3 minutes Radiation Exposure Index (if provided by the fluoroscopic device): 82.8 mGy Number of Acquired Spot Images: 0 COMPARISON:  Esophagram 11/29/2020 FINDINGS: Esophageal distention: The esophagus demonstrated normal distensibility. The GE junction appeared to distend normally. Filling defects:  No discrete filling defects were identified. 12.5 mm barium  tablet: The barium tablet held up at the GE junction for several minutes and persisted with multiple sips of water and barium. Motility: There is moderate dysmotility with uncoordinated peristaltic stripping wave. Mucosa:  No discrete mucosal lesions are identified. Hypopharynx/cervical esophagus:  No aspiration was identified. Hiatal hernia:  None identified. GE reflux:  There was reflux to the level of the thoracic inlet. Other:  None. IMPRESSION: 1. The barium tablet held up at the GE junction for several minutes. No discrete mucosal lesion or filling defect was identified. Consider correlation with endoscopy. 2. Moderate dysmotility. 3. Esophageal reflux to the level of the thoracic inlet. Electronically Signed   By: Valetta Mole M.D.   On: 09/18/2021 09:07      ASSESSMENT & PLAN:  1. Other iron deficiency anemia   2. Stage 3a chronic kidney disease (HCC)    #Iron deficiency anemia, Labs are reviewed and discussed with patient. She has decreased iron saturation of 7.  Consistent with iron deficiency Patient has been on oral iron supplementation. Discussed with patient about IV iron treatment. Plan IV iron with Venofer 200mg  weekly x 3 doses. Allergy reactions/infusion reaction including anaphylactic reaction discussed with patient. Other side effects include but not  limited to high blood pressure, skin rash, weight gain, leg swelling, etc. Patient voices understanding and willing to proceed.   Chronic kidney disease, SPEP showed no M protein.  Patient has increased free light chain levels with normal light chain ratio.  Discussed with patient that increased free light chain levels could be secondary to chronic inflammation.  No signs of plasma cell disorder.   Orders Placed This Encounter  Procedures   CBC with Differential/Platelet    Standing Status:   Future    Standing Expiration Date:   09/20/2022   Ferritin    Standing Status:   Future    Standing Expiration Date:   09/20/2022    Iron and TIBC    Standing Status:   Future    Standing Expiration Date:   09/20/2022    All questions were answered. The patient knows to call the clinic with any problems questions or concerns.   [name], MD    Return of visit: 3 months MD +/- Venofer.   [name], MD, PhD  09/20/2021

## 2021-09-20 NOTE — ED Triage Notes (Signed)
Pt c/o cough with green phlegm, nasal congestion, headache, body aches and chills, fatigue, nausea. Onset about 2 weeks ago. States tried dayquil, aspirin, and an abx at home without relief. States had xray 2 weeks ago.

## 2021-09-20 NOTE — Patient Instructions (Signed)

## 2021-09-20 NOTE — ED Provider Notes (Signed)
[place]   MRN: [id number] DOB: 04/22/1951  Subjective:   Morgan [name] is a 70 y.o. female presenting for 10-day history of acute onset persistent cough, chest congestion, sinus congestion and pressure.  She was last seen in 09/12/2021.  Had a negative chest x-ray then. Has a history of COPD. Has been using her inhalers. COVID testing was negative. Last EF from 2019, was 55-60%. No chest pain, dyspnea.   No current facility-administered medications for this encounter.  Current Outpatient Medications:    albuterol (VENTOLIN HFA) 108 (90 Base) MCG/ACT inhaler, Inhale 1-2 puffs into the lungs every 6 (six) hours as needed for wheezing or shortness of breath., Disp: 18 g, Rfl: 0   alendronate (FOSAMAX) 70 MG tablet, Take 1 tablet (70 mg total) by mouth every 7 (seven) days. Take with a full glass of water on an empty stomach., Disp: 4 tablet, Rfl: 11   aspirin-acetaminophen-caffeine (EXCEDRIN MIGRAINE) 250-250-65 MG tablet, Take 1-2 tablets by mouth every 6 (six) hours as needed for headache. (Patient not taking: Reported on 09/20/2021), Disp: , Rfl:    cetirizine (ZYRTEC ALLERGY) 10 MG tablet, Take 1 tablet (10 mg total) by mouth daily., Disp: 30 tablet, Rfl: 0   FLUoxetine (PROZAC) 40 MG capsule, Take 40 mg by mouth daily., Disp: , Rfl:    furosemide (LASIX) 40 MG tablet, Take 1 tablet (40 mg total) by mouth daily as needed for fluid or edema. (Patient taking differently: Take 40 mg by mouth 2 (two) times daily.), Disp: 30 tablet, Rfl:    gabapentin (NEURONTIN) 400 MG capsule, Take 400 mg by mouth 3 (three) times daily as needed (for nerve pain)., Disp: , Rfl:    hydrOXYzine (ATARAX/VISTARIL) 25 MG tablet, Take 25 mg by mouth every 8 (eight) hours as needed for anxiety., Disp: , Rfl:    ibuprofen (ADVIL) 600 MG tablet, Take 1 tablet (600 mg total) by mouth every 6 (six) hours as needed. (Patient not taking: Reported on 09/20/2021), Disp: 30 tablet, Rfl: 0   mirtazapine (REMERON) 15  MG tablet, Take 15 mg by mouth at bedtime., Disp: , Rfl:    omeprazole (PRILOSEC) 40 MG capsule, TAKE 1 CAPSULE BY MOUTH EVERY DAY (Patient taking differently: Take 80 mg by mouth daily.), Disp: 90 capsule, Rfl: 3   ondansetron (ZOFRAN-ODT) 4 MG disintegrating tablet, Take 1-2 tablets by mouth every 6 hours as needed, Disp: 360 tablet, Rfl: 3   OXYGEN, Inhale 2 L/min into the lungs See admin instructions. 2 L/min at bedtime and as needed for shortness of breath throughout the day, Disp: , Rfl:    potassium chloride SA (KLOR-CON) 20 MEQ tablet, Take 1 tablet (20 mEq total) by mouth daily. (Patient taking differently: Take 20 mEq by mouth daily. With furosemide), Disp: 30 tablet, Rfl: 3   valsartan (DIOVAN) 320 MG tablet, Take 320 mg by mouth daily., Disp: , Rfl:    Allergies  Allergen Reactions   Meperidine Hives and Anaphylaxis   Strawberry Extract Hives and Swelling   Carafate [Sucralfate] Nausea Only and Other (See Comments)    Severe nausea   Wasp Venom Hives    Past Medical History:  Diagnosis Date   Allergy    Anemia    Anxiety    Arthritis    CHF (congestive heart failure) (HCC)    Depression    GERD (gastroesophageal reflux disease)    Hypertension    MVP (mitral valve prolapse)      Past Surgical History:  Procedure  Laterality Date   ABDOMINAL HYSTERECTOMY     APPENDECTOMY     BALLOON DILATION N/A 04/21/2020   Procedure: BALLOON DILATION;  Surgeon: Milus Banister, MD;  Location: [facility];  Service: Endoscopy;  Laterality: N/A;  pyloric/ GI   BALLOON DILATION N/A 07/04/2020   Procedure: BALLOON DILATION;  Surgeon: Mauri Pole, MD;  Location: [facility];  Service: Endoscopy;  Laterality: N/A;   BIOPSY  04/21/2020   Procedure: BIOPSY;  Surgeon: Milus Banister, MD;  Location: [facility];  Service: Endoscopy;;   BIOPSY  07/04/2020   Procedure: BIOPSY;  Surgeon: Mauri Pole, MD;  Location: [facility];  Service: Endoscopy;;  EGD and COLON   BREAST  SURGERY Right 1988   tumor removal   CHOLECYSTECTOMY N/A 05/18/2020   Procedure: LAPAROSCOPIC CHOLECYSTECTOMY;  Surgeon: Johnathan Hausen, MD;  Location: [facility];  Service: General;  Laterality: N/A;   COLONOSCOPY WITH PROPOFOL N/A 07/04/2020   Procedure: COLONOSCOPY WITH PROPOFOL;  Surgeon: Mauri Pole, MD;  Location: [facility];  Service: Endoscopy;  Laterality: N/A;   ESOPHAGEAL DILATION  08/11/2020   Procedure: PYLORIC DILATION;  Surgeon: Doran Stabler, MD;  Location: [facility];  Service: Gastroenterology;;   ESOPHAGOGASTRODUODENOSCOPY (EGD) WITH PROPOFOL N/A 04/21/2020   Procedure: ESOPHAGOGASTRODUODENOSCOPY (EGD) WITH PROPOFOL;  Surgeon: Milus Banister, MD;  Location: [facility];  Service: Endoscopy;  Laterality: N/A;   ESOPHAGOGASTRODUODENOSCOPY (EGD) WITH PROPOFOL N/A 07/04/2020   Procedure: ESOPHAGOGASTRODUODENOSCOPY (EGD) WITH PROPOFOL;  Surgeon: Mauri Pole, MD;  Location: [facility];  Service: Endoscopy;  Laterality: N/A;   ESOPHAGOGASTRODUODENOSCOPY (EGD) WITH PROPOFOL N/A 08/11/2020   Procedure: ESOPHAGOGASTRODUODENOSCOPY (EGD) WITH PROPOFOL;  Surgeon: Doran Stabler, MD;  Location: [facility];  Service: Gastroenterology;  Laterality: N/A;   KNEE SURGERY  2005   2 times left   KNEE SURGERY Right    KYPHOPLASTY N/A 07/20/2021   Procedure: T 10KYPHOPLASTY;  Surgeon: Hessie Knows, MD;  Location: [facility];  Service: Orthopedics;  Laterality: N/A;   KYPHOPLASTY N/A 08/23/2021   Procedure: T9 KYPHOPLASTY;  Surgeon: Hessie Knows, MD;  Location: [facility];  Service: Orthopedics;  Laterality: N/A;   POLYPECTOMY  07/04/2020   Procedure: POLYPECTOMY;  Surgeon: Mauri Pole, MD;  Location: [facility];  Service: Endoscopy;;   TUBAL LIGATION      Family History  Problem Relation Age of Onset   Breast cancer Mother 26   Ovarian cancer Mother 48   Brain cancer Mother    Hypertension Father    Hyperlipidemia Father    Heart disease Father    Colon  cancer Father        diagnosed in his 66s   Breast cancer Maternal Grandmother 63   Diabetes Paternal Grandmother    Breast cancer Paternal Grandmother 29   Other Son        drug over dose   Colon cancer Maternal Grandfather        dx in his 56s   Liver disease Maternal Grandfather    Breast cancer Maternal Aunt 60   Breast cancer Maternal Aunt 60   Colon cancer Maternal Uncle    Stomach cancer Neg Hx    Pancreatic cancer Neg Hx    Esophageal cancer Neg Hx     Social History   Tobacco Use   Smoking status: Former    Packs/day: 1.00    Years: 15.00    Pack years: 15.00    Types: Cigarettes    Quit date: 09/12/2017  Years since quitting: 4.0   Smokeless tobacco: Never  Vaping Use   Vaping Use: Never used  Substance Use Topics   Alcohol use: Not Currently   Drug use: No    ROS   Objective:   Vitals: BP (!) 190/68 (BP Location: Left Arm)   Pulse 91   Temp 99.1 F (37.3 C) (Oral)   Resp 18   SpO2 94%   Physical Exam Constitutional:      General: She is not in acute distress.    Appearance: Normal appearance. She is well-developed. She is ill-appearing. She is not toxic-appearing or diaphoretic.  HENT:     Head: Normocephalic and atraumatic.     Nose: Nose normal.     Mouth/Throat:     Mouth: Mucous membranes are moist.  Eyes:     Extraocular Movements: Extraocular movements intact.     Pupils: Pupils are equal, round, and reactive to light.  Cardiovascular:     Rate and Rhythm: Normal rate and regular rhythm.     Pulses: Normal pulses.     Heart sounds: Normal heart sounds. No murmur heard.   No friction rub. No gallop.  Pulmonary:     Effort: Pulmonary effort is normal. No respiratory distress.     Breath sounds: Normal breath sounds. No stridor. No wheezing, rhonchi or rales.  Skin:    General: Skin is warm and dry.     Findings: No rash.  Neurological:     Mental Status: She is alert and oriented to person, place, and time.  Psychiatric:         Mood and Affect: Mood normal.        Behavior: Behavior normal.        Thought Content: Thought content normal.    DG Chest 2 View  Result Date: 09/20/2021 CLINICAL DATA:  Cough. EXAM: CHEST - 2 VIEW COMPARISON:  Chest x-ray 09/12/2021. FINDINGS: The heart size and mediastinal contours are within normal limits. Both lungs are clear. Multiple thoracic level vertebroplasty changes are again noted. There is stable scarring in both lung apices. IMPRESSION: No active cardiopulmonary disease. Electronically Signed   By: Ronney Asters M.D.   On: 09/20/2021 16:43     Assessment and Plan :   PDMP not reviewed this encounter.  1. Acute non-recurrent sinusitis of other sinus   2. Acute cough   3. Chronic obstructive pulmonary disease, unspecified COPD type ([place])   4. History of CHF (congestive heart failure)     Will start empiric treatment for sinusitis with Augmentin.  Recommended using a prednisone course in light of her COPD.  Patient is already on an antihistamine and recommended she continue this.  Maintain her inhalers.  Low suspicion for COPD exacerbation and therefore will defer ER visit.  Last EF was reasonable for her to use prednisone course despite her CHF.  Counseled patient on potential for adverse effects with medications prescribed/recommended today, ER and return-to-clinic precautions discussed, patient verbalized understanding.    Jaynee Eagles, PA-C 09/20/21 1735

## 2021-09-20 NOTE — Progress Notes (Signed)
Pt here for follow up. Pt reports feeling "lousy" today.

## 2021-09-20 NOTE — Progress Notes (Signed)
Chronic Care Management Pharmacy Note  09/22/2021 Name:  Morgan [name] MRN:  [id number] DOB:  07-25-1951  Summary: -Pt has not been taking atorvastatin for ~1 year; it is unclear why she stopped; the most recent cardiology notes (04/2021) advise to continue statin; most recent lipid panel is from 12/2018 when pt was taking the statin (LDL was 69) -Pt has not been taking carvedilol for several months, she reports her cardiologist told her to stop it when he started valsartan in May; per chart notes there is no mention of discontinuing carvedilol and I cannot identify a clinical reason to stop it; of note BP has been elevated in recent office visits (in s/o pain, URI), pt is not checking BP at home -Pt has a history of CVA (2017 per chart review), and is not taking aspirin -Pt complaining of worsening URI symptoms (coughing green mucus, sore throat) after completing a prednisone course  Recommendations/Changes made from today's visit: -Recommend to repeat lipid panel and restart atorvastatin 40 mg  -Advised pt to obtain a BP monitor and check BP at home daily; consider restarting carvedilol 6.25 mg BID if home BP can tolerate it -Recommend to restart aspirin 81 mg daily  -Advised pt to return to Urgent Care for evaluation of worsening URI symptoms   Subjective: Morgan [name] is an 70 y.o. year old female who is a primary patient of Cody, Jobe Marker, MD.  The CCM team was consulted for assistance with disease management and care coordination needs.    Engaged with patient by telephone for initial visit in response to provider referral for pharmacy case management and/or care coordination services.   Consent to Services:  The patient was given the following information about Chronic Care Management services today, agreed to services, and gave verbal consent: 1. CCM service includes personalized support from designated clinical staff supervised by the primary care provider, including individualized  plan of care and coordination with other care providers 2. 24/7 contact phone numbers for assistance for urgent and routine care needs. 3. Service will only be billed when office clinical staff spend 20 minutes or more in a month to coordinate care. 4. Only one practitioner may furnish and bill the service in a calendar month. 5.The patient may stop CCM services at any time (effective at the end of the month) by phone call to the office staff. 6. The patient will be responsible for cost sharing (co-pay) of up to 20% of the service fee (after annual deductible is met). Patient agreed to services and consent obtained.  Patient Care Team: [name], MD as PCP - General (Family Medicine) Charlton Haws, [facility] as Pharmacist (Pharmacist)  Recent office visits: 08/28/21 [name], MD- PCP: chronic f/u; CHF exacerbation - increase lasix to 40 mg BID x 5 days, start Kcl 20 meq w/ lasix. C/o pyloric stenosis, advised to contact GI, hold starting alendronate until sx resolve. Low HGB - referred to hematology.   06/26/21 [name], MD- PCP: acute visit s/p fall, back pain. Fell ~6/27. Rx'd oxycodone for pain. Referral to ortho for severe pain response.  Recent consult visits: 09/12/21 Urgent care: c/o sore throat. Dx viral URI. Covid, strep negative.  Meds: cetirizine 10 mg daily, and prednisone 30 mg daily   09/11/21 [name], MD (Heme/onc): new pt - eval IDA. RTC 2 weeks, +/- Venofer. (Avoid oral iron d/t chronic constipation)   09/06/21  Locklear, [name], MD (GI [name]): f/u chronic nausea, dysphagia.  Ordered barium swallow, NM gastric emptying study.  07/31/21 [name], MD (ortho - [name]): f/u T10 compression fx.  07/19/21 [name], MD  (ortho [name]): f/u T10 compression fx. Schedule kyphoplasty tomorrow and vertebroplasty soon.  07/05/21 [name], PA (Ortho [name]): initial visit for compression fracture.  06/12/21 Urgent Care -  Pneumonia, rib pain. Rx'd Augmentin, Doxycycline, ibuprofen, tizanidine.  05/11/21 Flossie Dibble, MD (cardiology - [name]): f/u HTN, aortic aneurysm. Increase valsartan to 320 mg daily.   Hospital visits: Medication Reconciliation was completed by comparing discharge summary, patient's EMR and Pharmacy list, and upon discussion with patient.  Admitted to the hospital on 08/23/21 due to T9 Kyphoplasty. Discharge date was 08/23/21. Discharged from [facility].     Medications that remain the same after Hospital Discharge:??  -All other medications will remain the same.   Admitted to the hospital on 07/20/21 due to T10 kyphoplasty. Discharge date was 07/20/21. Discharged from [facility].    Medications Discontinued at Hospital Discharge: - abilfy, metamucil, potassium, sucralfate and tizanidine  Medications that remain the same after Hospital Discharge:??  -All other medications will remain the same.   Admitted to the ED on 07/01/21 due to fall. Discharge date was 07/01/21. Discharged from [place] and CT negative for acute abnormalities  Medications that remain the same after Hospital Discharge:??  -All other medications will remain the same.   Admitted to the ED on 03/26/21 due to Knee sprain. Discharge date was 03/26/21. Discharged from [facility] ED   Medications Started at Hospital Discharge: - oxycodone 5 mg   Objective:  Lab Results  Component Value Date   CREATININE 1.01 08/29/2021   BUN 16 08/29/2021   GFR 57.33 (L) 08/29/2021   GFRNONAA 55 (L) 02/20/2021   GFRAA >60 09/02/2020   NA 141 08/29/2021   K 3.2 (L) 08/29/2021   CALCIUM 9.0 08/29/2021   CO2 39 (H) 08/29/2021   GLUCOSE 116 (H) 08/29/2021    Lab Results  Component Value Date/Time   HGBA1C 5.8 (H) 10/05/2017 03:50 AM   GFR 57.33 (L) 08/29/2021 09:31 AM   GFR 37.68 (L) 10/20/2020 03:11 PM    Last diabetic Eye exam:  No results found for: HMDIABEYEEXA  Last diabetic Foot exam: No results found for: HMDIABFOOTEX   Lab Results  Component Value Date   CHOL 137 12/26/2018   HDL 49 12/26/2018   LDLCALC 69 12/26/2018   TRIG 96 12/26/2018   CHOLHDL 4.3 10/05/2017    Hepatic Function Latest Ref Rng & Units 08/29/2021 02/05/2021 10/20/2020  Total Protein 6.0 - 8.3 g/dL 6.5 6.0(L) 6.7  Albumin 3.5 - 5.2 g/dL 3.5 3.0(L) 3.7  AST 0 - 37 U/L 15 16 18   ALT 0 - 35 U/L 9 12 11   Alk Phosphatase 39 - 117 U/L 152(H) 109 109  Total Bilirubin 0.2 - 1.2 mg/dL 0.5 0.3 0.2  Bilirubin, Direct 0.0 - 0.2 mg/dL - - -    Lab Results  Component Value Date/Time   TSH 2.46 08/29/2021 09:31 AM   TSH 4.591 (H) 08/10/2020 05:49 AM   TSH 1.89 12/26/2018 12:00 AM   FREET4 1.01 08/29/2021 09:31 AM    CBC Latest Ref Rng & Units 09/11/2021 08/29/2021 02/20/2021  WBC 4.0 - 10.5 K/uL 6.1 7.7 8.0  Hemoglobin 12.0 - 15.0 g/dL 10.5(L) 9.9(L) 10.0(L)  Hematocrit 36.0 - 46.0 % 35.5(L) 31.1(L) 32.3(L)  Platelets 150 - 400 K/uL  344 312.0 343   Iron/TIBC/Ferritin/ %Sat    Component Value Date/Time   IRON 24 (L) 09/11/2021 1113   TIBC 351 09/11/2021 1113   FERRITIN 42 09/11/2021 1113   IRONPCTSAT 7 (L) 09/11/2021 1113    Lab Results  Component Value Date/Time   VD25OH 38.85 08/29/2021 09:31 AM    Clinical ASCVD: Yes  The ASCVD Risk score (Arnett DK, et al., 2019) failed to calculate for the following reasons:   The patient has a prior MI or stroke diagnosis    Depression screen [facility] 2/9 08/28/2021 11/12/2018  Decreased Interest 1 0  Down, Depressed, Hopeless 1 0  PHQ - 2 Score 2 0  Altered sleeping 1 -  Tired, decreased energy 1 -  Change in appetite 1 -  Feeling bad or failure about yourself  1 -  Trouble concentrating 1 -  Moving slowly or fidgety/restless 1 -  Suicidal thoughts 0 -  PHQ-9 Score 8 -  Difficult doing work/chores Somewhat difficult -  Some recent data might be hidden     Social History   Tobacco Use   Smoking Status Former   Packs/day: 1.00   Years: 15.00   Pack years: 15.00   Types: Cigarettes   Quit date: 09/12/2017   Years since quitting: 4.0  Smokeless Tobacco Never   BP Readings from Last 3 Encounters:  09/20/21 (!) 164/84  09/20/21 (!) 178/98  09/20/21 (!) 171/93   Pulse Readings from Last 3 Encounters:  09/20/21 91  09/20/21 78  09/20/21 77   Wt Readings from Last 3 Encounters:  09/20/21 235 lb 1.6 oz (106.6 kg)  09/11/21 231 lb 12.8 oz (105.1 kg)  08/28/21 238 lb 2 oz (108 kg)   BMI Readings from Last 3 Encounters:  09/20/21 31.02 kg/m  09/11/21 30.58 kg/m  08/28/21 31.42 kg/m    Assessment/Interventions: Review of patient past medical history, allergies, medications, health status, including review of consultants reports, laboratory and other test data, was performed as part of comprehensive evaluation and provision of chronic care management services.   SDOH:  (Social Determinants of Health) assessments and interventions performed: Yes  SDOH Screenings   Alcohol Screen: Not on file  Depression (PHQ2-9): Medium Risk   PHQ-2 Score: 8  Financial Resource Strain: Not on file  Food Insecurity: Not on file  Housing: Not on file  Physical Activity: Not on file  Social Connections: Not on file  Stress: Not on file  Tobacco Use: Medium Risk   Smoking Tobacco Use: Former   Smokeless Tobacco Use: Never  Transportation Needs: Not on file    [place]  Allergies  Allergen Reactions   Meperidine Hives and Anaphylaxis   Strawberry Extract Hives and Swelling   Carafate [Sucralfate] Nausea Only and Other (See Comments)    Severe nausea   Wasp Venom Hives    Medications Reviewed Today     Reviewed by Alexia Freestone, RN (Registered Nurse) on 09/20/21 at [facility] List Status: <None>   Medication Order Taking? Sig Documenting Provider Last Dose Status Informant  albuterol (VENTOLIN HFA) 108 (90 Base) MCG/ACT inhaler [id number]  Inhale 1-2 puffs into  the lungs every 6 (six) hours as needed for wheezing or shortness of breath. Ok Edwards, PA-C  Active Self  alendronate (FOSAMAX) 70 MG tablet [id number]  Take 1 tablet (70 mg total) by mouth every 7 (seven) days. Take with a full glass of water on an empty stomach. [name], MD  Active  aspirin-acetaminophen-caffeine (EXCEDRIN MIGRAINE) 250-250-65 MG tablet [id number]  Take 1-2 tablets by mouth every 6 (six) hours as needed for headache.  Patient not taking: Reported on 09/20/2021   [provider]  Active Self  cetirizine (ZYRTEC ALLERGY) 10 MG tablet [id number]  Take 1 tablet (10 mg total) by mouth daily. Jaynee Eagles, PA-C  Active   FLUoxetine (PROZAC) 40 MG capsule [id number]  Take 40 mg by mouth daily. [provider]  Active Self  furosemide (LASIX) 40 MG tablet [id number]  Take 1 tablet (40 mg total) by mouth daily as needed for fluid or edema.  Patient taking differently: Take 40 mg by mouth 2 (two) times daily.   Mercy Riding, MD  Active   gabapentin (NEURONTIN) 400 MG capsule [id number]  Take 400 mg by mouth 3 (three) times daily as needed (for nerve pain). [provider]  Active Self  hydrOXYzine (ATARAX/VISTARIL) 25 MG tablet [id number]  Take 25 mg by mouth every 8 (eight) hours as needed for anxiety. [provider]  Active Self  ibuprofen (ADVIL) 600 MG tablet [id number]  Take 1 tablet (600 mg total) by mouth every 6 (six) hours as needed.  Patient not taking: Reported on 09/20/2021   Melynda Ripple, MD  Active Self  mirtazapine (REMERON) 15 MG tablet [id number]  Take 15 mg by mouth at bedtime. [provider]  Active Self  omeprazole (PRILOSEC) 40 MG capsule [id number]  TAKE 1 CAPSULE BY MOUTH EVERY DAY  Patient taking differently: Take 80 mg by mouth daily.   [name], MD  Active   ondansetron (ZOFRAN-ODT) 4 MG disintegrating tablet [id number]  Take 1-2 tablets by mouth every 6 hours as needed Levin Erp, [place]  Active  Self  OXYGEN [id number]  Inhale 2 L/min into the lungs See admin instructions. 2 L/min at bedtime and as needed for shortness of breath throughout the day [provider]  Active Self  potassium chloride SA (KLOR-CON) 20 MEQ tablet [id number]  Take 1 tablet (20 mEq total) by mouth daily.  Patient taking differently: Take 20 mEq by mouth daily. With furosemide   [name], MD  Active   valsartan (DIOVAN) 320 MG tablet [id number]  Take 320 mg by mouth daily. [provider]  Active Self            Patient Active Problem List   Diagnosis Date Noted   Microcytic anemia 09/11/2021   Stage 3a chronic kidney disease ([place]) 09/11/2021   IDA (iron deficiency anemia) 09/11/2021   Elevated TSH 08/28/2021   Osteopenia 08/28/2021   DOE (dyspnea on exertion) 08/28/2021   Acute midline thoracic back pain 06/26/2021   Other chronic pain 06/26/2021   Chest pressure 02/20/2021   Left sided abdominal pain 02/01/2021   Chronic diastolic CHF (congestive heart failure) ([place]) 08/09/2020   COPD (chronic obstructive pulmonary disease) ([place]) 08/09/2020   Chronic respiratory failure with hypoxia ([place]) 08/09/2020   Polyp of sigmoid colon    Polyp of cecum    Gastritis and gastroduodenitis    Pylorus ulcer    Pyloric stenosis in adult    Chronic cholecystitis 05/19/2020   Oxygen dependent 03/30/2020   Nausea and vomiting in adult 03/22/2020   Peptic ulcer disease 03/22/2020   Iron deficiency anemia 03/22/2020   Acute pain of right knee 12/29/2019   Acute left-sided low back pain without sciatica 12/29/2019   Fall 04/08/2019   Chronic abdominal pain 02/19/2019   Asymptomatic bacteriuria 02/19/2019   CKD (  chronic kidney disease) stage 3, GFR 30-59 ml/min (HCC) 01/13/2019   CHF (congestive heart failure) ([place]) 01/13/2019   Dizziness 01/13/2019   Hoarseness 11/12/2018   Dysphagia 11/12/2018   Sleepiness 11/12/2018   CVA (cerebral vascular accident) ([place]) 08/22/2018   Left-sided  weakness 10/04/2017   HLD (hyperlipidemia) 10/04/2017   GERD (gastroesophageal reflux disease) 10/04/2017   Anxiety 08/26/2017   Essential hypertension    Depression    Diarrhea 02/22/2015   Hypokalemia 02/22/2015   Chronic lower back pain 04/29/2013   Paresthesia 04/29/2013    Immunization History  Administered Date(s) Administered   Fluad Quad(high Dose 65+) 09/30/2019, 08/29/2020, 08/28/2021   Influenza, High Dose Seasonal PF 08/23/2018   Influenza,inj,Quad PF,6+ Mos 02/24/2015   Moderna Sars-Covid-2 Vaccination 03/18/2020, 04/15/2020   Pneumococcal Conjugate-13 11/12/2018   Pneumococcal Polysaccharide-23 02/24/2015    Conditions to be addressed/monitored:  Hypertension, Hyperlipidemia, Heart Failure, GERD, COPD, Chronic Kidney Disease, Depression, Anxiety, and Osteoporosis, hx CVA  Care Plan : [place]  Updates made by Charlton Haws, [place] since 09/22/2021 12:00 AM     Problem: Hypertension, Hyperlipidemia, Heart Failure, GERD, COPD, Chronic Kidney Disease, Depression, Anxiety, and Osteoporosis, hx CVA   Priority: High     Long-Range Goal: Disease management   Start Date: 09/22/2021  Expected End Date: 09/22/2022  This Visit's Progress: Not on track  Priority: High  Note:   Current Barriers:  Unable to independently monitor therapeutic efficacy Suboptimal therapeutic regimen for HTN/heart failure  Pharmacist Clinical Goal(s):  Patient will achieve adherence to monitoring guidelines and medication adherence to achieve therapeutic efficacy adhere to plan to optimize therapeutic regimen for HTN/HF as evidenced by report of adherence to recommended medication management changes through collaboration with PharmD and provider.   Interventions: 1:1 collaboration with [name], MD regarding development and update of comprehensive plan of care as evidenced by provider attestation and co-signature Inter-disciplinary care team collaboration (see  longitudinal plan of care) Comprehensive medication review performed; medication list updated in electronic medical record  Hyperlipidemia: (LDL goal < 70) -Not ideally controlled - pt is not taking a statin; she was previously on atorvastatin 40 mg but has not had this filled in over a year (07/2020), she is not sure why; per cardiology notes pt is supposed to still be taking atorvastatin; LDL was at goal in 12/2018 when last checked, while pt was taking a statin; lipids have not been checked since she has been off the statin -Hx CVA -Current treatment: None -Medications previously tried: atorvastatin 40 mg  -Current exercise habits: none - pain and SOB -Educated on Cholesterol goals; Benefits of statin for ASCVD risk reduction; given history of CVA it is recommended she take a high-intensity statin, she seemed to tolerate atorvastatin 40 mg previously -Recommend re-checking lipid panel and restarting atorvastatin 40 mg -Recommend starting aspirin 81 mg daily given history of CVA  Heart Failure / Hypertension (Goal: BP < 140/90 and prevent exacerbations) -Not ideally controlled - pt is not checking BP at home, BP has been elevated at recent office visits in s/o pain, respiratory infection; pt is not taking carvedilol and reports her cardiologist told her to stop it when he started valsartan; per chart notes there is no mention of discontinuing carvedilol and was active on med list up until recently when it was removed for "pt not taking" -Pt is taking furosemide BID currently due to excess swelling; she is not weighing herself; she is taking potassium with each furosemide dose -Current home  BP/HR readings: n/a -Last ejection fraction: 55-65% (Date: 08/23/2018) -HF type: Diastolic -Current treatment: Valsartan 320 mg daily Carvedilol 6.25 mg BID - not taking Furosemide 40 mg daily PRN - taking BID currently Potassium chloride 20 meq w/ lasix -Medications previously tried: amlodipine, carvedilol,  spironolactone, telmisartan, HCTZ -Educated on Benefits of medications for managing symptoms and prolonging life; Proper diuretic administration and potassium supplementation; Importance of blood pressure control -Counseled on limitations of office BP monitoring alone, benefits of home BP monitoring -Recommended to obtain a BP monitor and check BP at home daily -Recommend to restart carvedilol 6.25 mg BID as long as home BP can tolerate it  COPD (Goal: control symptoms and prevent exacerbations) -Not ideally controlled - pt was seen 9/27 at urgent care for URI symptoms, covid and strep tests were negative, she has now completed course of prednisone; she reports symptoms are worse now and she is coughing up green mucus; she does not recall ever being on a maintenance/controller inhaler -Current treatment  Albuterol HFA Cetirizine 10 mg daily Supplemental O2 (noctural) -Gold Grade: unknown (no PFTs on file) -Pulmonary function testing: not on file -Exacerbations requiring treatment in last 6 months: 1? -Frequency of rescue inhaler use: daily - Pt may benefit from a maintenance inhaler - consider adding in future -Advised pt to return to urgent care for evaluation of worsening respiratory symptoms  Depression/Anxiety (Goal: manage symtoms) -Controlled - pt reports mood is under control, current doses are fine -Current treatment: Fluoxetine 40 mg - 1 cap daily Hydroxyzine 25 mg q8h PRN - takes 1 daily Mirtazapine 15 mg HS -Medications previously tried/failed: Abilify, duloxetine, hydroxyzine -PHQ9: 8 (08/2021) -GAD7: not on file -Recommended to continue current medication  Osteopenia (Goal prevent fractures) -Not ideally controlled - pt has been prescribed alendronate but she not started it yet due to GI issues -Last DEXA Scan: 08/10/21  T-Score total hip: -2.0  T-Score lumbar spine: -1.6  10-year probability of major osteoporotic fracture: 17.6%  10-year probability of hip fracture:  3.0% -Patient is a candidate for pharmacologic treatment due to history of vertebral fracture and T-Score -1.0 to -2.5 and 10-year risk of hip fracture > 3% -Current treatment  Alendronate 70 mg weekly (prescribed 08/2021, not started yet) -Recommend [phone] units of vitamin D daily. Recommend 1200 mg of calcium daily from dietary and supplemental sources. Counseled on oral bisphosphonate administration: take in the morning, 30 minutes prior to food with 6-8 oz of water. Do not lie down for at least 30 minutes after taking.  Back pain (Goal: manage pain) -Controlled -hx multiple falls, T10 compression fracture s/p kyphoplasty 07/20/21 -Current treatment: Gabapentin 400 mg TID prn Meloxicam 7.5 mg daily -Recommended to continue current medication  GERD / dysphagia / PUD (Goal: manage symptoms) -Controlled -hx pyloric stenosis. Follows w/ GI. -Current treatment  Omeprazole 40 mg daily - 2 daily Ondansetron 4 mg PRN -Chronic PPI use is not ideal given bone density issues; however pt has severe symptoms if she does not take PPI -Recommended to continue current medication  Chronic constipation (Goal: manage symptoms) -Controlled - pt is not taking Linzess consistently (she was using samples);  -Current treatment  Linzess 145 mcg daily PRN -Recommended to continue current medication; advised to contact GI for additional samples if needed  Health Maintenance -Vaccine gaps: TDAP, Shingrix, covid booster -Current therapy:  Excedrine Migraine (ASA-APAP-caffeine) -Patient is satisfied with current therapy and denies issues -Recommended to continue current medication  Patient Goals/Self-Care Activities Patient will:  - take medications as  prescribed focus on medication adherence by pill box check blood pressure daily -Return to urgent care for worsening respiratory symptoms      Medication Assistance: None required.  Patient affirms current coverage meets  needs.  Compliance/Adherence/Medication fill history: Care Gaps: Mammogram (07/16/19)  Star-Rating Drugs: Valsartan - LF 08/14/21 x 90 ds  Patient's preferred pharmacy is:  [id number] [place]: [phone]: [address]SM[place]Phone: [phone]: [phone] Uses pill box? No - prefers bottles Pt endorses 100% compliance  We discussed: Current pharmacy is preferred with insurance plan and patient is satisfied with pharmacy services Patient decided to: Continue current medication management strategy  Care Plan and Follow Up Patient Decision:  Patient agrees to Care Plan and Follow-up.  Plan: Telephone follow up appointment with care management team member scheduled for:  1 month  LCharlene Brooke PharmD, BMcCleary CPP Clinical Pharmacist [facility]North [facility] [phone]

## 2021-09-20 NOTE — Progress Notes (Signed)
Pt tolerated venofer infusion well today with no problems or complaints. Pt left infusion suite stable and ambulatory.  

## 2021-09-22 NOTE — Patient Instructions (Addendum)
Visit Information  Phone number for Pharmacist: [phone]  Thank you for meeting with me to discuss your medications! I look forward to working with you to achieve your health care goals. Below is a summary of what we talked about during the visit:   Goals Addressed             This Visit's Progress    Manage My Medicine       Timeframe:  Long-Range Goal Priority:  High Start Date:        09/22/21                     Expected End Date:     09/22/22                  Follow Up Date Nov 2022   - call for medicine refill 2 or 3 days before it runs out - call if I am sick and can't take my medicine - keep a list of all the medicines I take; vitamins and herbals too - use a pillbox to sort medicine    Why is this important?   These steps will help you keep on track with your medicines.   Notes:         Care Plan : Morgan [name]  Updates made by Morgan [name], RPH since 09/22/2021 12:00 AM     Problem: Hypertension, Hyperlipidemia, Heart Failure, GERD, COPD, Chronic Kidney Disease, Depression, Anxiety, and Osteoporosis, hx CVA   Priority: High     Long-Range Goal: Disease management   Start Date: 09/22/2021  Expected End Date: 09/22/2022  This Visit's Progress: Not on track  Priority: High  Note:   Current Barriers:  Unable to independently monitor therapeutic efficacy Suboptimal therapeutic regimen for HTN/heart failure  Pharmacist Clinical Goal(s):  Patient will achieve adherence to monitoring guidelines and medication adherence to achieve therapeutic efficacy adhere to plan to optimize therapeutic regimen for HTN/HF as evidenced by report of adherence to recommended medication management changes through collaboration with PharmD and provider.   Interventions: 1:1 collaboration with [name], MD regarding development and update of comprehensive plan of care as evidenced by provider attestation and co-signature Inter-disciplinary care team  collaboration (see longitudinal plan of care) Comprehensive medication review performed; medication list updated in electronic medical record  Hyperlipidemia: (LDL goal < 70) -Not ideally controlled - pt is not taking a statin; she was previously on atorvastatin 40 mg but has not had this filled in over a year (07/2020), she is not sure why; per cardiology notes pt is supposed to still be taking atorvastatin; LDL was at goal in 12/2018 when last checked, while pt was taking a statin; lipids have not been checked since she has been off the statin -Hx CVA -Current treatment: None -Medications previously tried: atorvastatin 40 mg  -Current exercise habits: none - pain and SOB -Educated on Cholesterol goals; Benefits of statin for ASCVD risk reduction; given history of CVA it is recommended she take a high-intensity statin, she seemed to tolerate atorvastatin 40 mg previously -Recommend re-checking lipid panel and restarting atorvastatin 40 mg -Recommend starting aspirin 81 mg daily given history of CVA  Heart Failure / Hypertension (Goal: BP < 140/90 and prevent exacerbations) -Not ideally controlled - pt is not checking BP at home, BP has been elevated at recent office visits in s/o pain, respiratory infection; pt is not taking carvedilol and reports her cardiologist told her to stop it when  he started valsartan; per chart notes there is no mention of discontinuing carvedilol and was active on med list up until recently when it was removed for "pt not taking" -Pt is taking furosemide BID currently due to excess swelling; she is not weighing herself; she is taking potassium with each furosemide dose -Current home BP/HR readings: n/a -Last ejection fraction: 55-65% (Date: 08/23/2018) -HF type: Diastolic -Current treatment: Valsartan 320 mg daily Carvedilol 6.25 mg BID - not taking Furosemide 40 mg daily PRN - taking BID currently Potassium chloride 20 meq w/ lasix -Medications previously tried:  amlodipine, carvedilol, spironolactone, telmisartan, HCTZ -Educated on Benefits of medications for managing symptoms and prolonging life; Proper diuretic administration and potassium supplementation; Importance of blood pressure control -Counseled on limitations of office BP monitoring alone, benefits of home BP monitoring -Recommended to obtain a BP monitor and check BP at home daily -Recommend to restart carvedilol 6.25 mg BID as long as home BP can tolerate it  COPD (Goal: control symptoms and prevent exacerbations) -Not ideally controlled - pt was seen 9/27 at urgent care for URI symptoms, covid and strep tests were negative, she has now completed course of prednisone; she reports symptoms are worse now and she is coughing up green mucus; she does not recall ever being on a maintenance/controller inhaler -Current treatment  Albuterol HFA Cetirizine 10 mg daily Supplemental O2 (noctural) -Gold Grade: unknown (no PFTs on file) -Pulmonary function testing: not on file -Exacerbations requiring treatment in last 6 months: 1? -Frequency of rescue inhaler use: daily - Pt may benefit from a maintenance inhaler - consider adding in future -Advised pt to return to urgent care for evaluation of worsening respiratory symptoms  Depression/Anxiety (Goal: manage symtoms) -Controlled - pt reports mood is under control, current doses are fine -Current treatment: Fluoxetine 40 mg - 1 cap daily Hydroxyzine 25 mg q8h PRN - takes 1 daily Mirtazapine 15 mg HS -Medications previously tried/failed: Abilify, duloxetine, hydroxyzine -PHQ9: 8 (08/2021) -GAD7: not on file -Recommended to continue current medication  Osteopenia (Goal prevent fractures) -Not ideally controlled - pt has been prescribed alendronate but she not started it yet due to GI issues -Last DEXA Scan: 08/10/21  T-Score total hip: -2.0  T-Score lumbar spine: -1.6  10-year probability of major osteoporotic fracture: 17.6%  10-year  probability of hip fracture: 3.0% -Patient is a candidate for pharmacologic treatment due to history of vertebral fracture and T-Score -1.0 to -2.5 and 10-year risk of hip fracture > 3% -Current treatment  Alendronate 70 mg weekly (prescribed 08/2021, not started yet) -Recommend [phone] units of vitamin D daily. Recommend 1200 mg of calcium daily from dietary and supplemental sources. Counseled on oral bisphosphonate administration: take in the morning, 30 minutes prior to food with 6-8 oz of water. Do not lie down for at least 30 minutes after taking.  Back pain (Goal: manage pain) -Controlled -hx multiple falls, T10 compression fracture s/p kyphoplasty 07/20/21 -Current treatment: Gabapentin 400 mg TID prn Meloxicam 7.5 mg daily -Recommended to continue current medication  GERD / dysphagia / PUD (Goal: manage symptoms) -Controlled -hx pyloric stenosis. Follows w/ GI. -Current treatment  Omeprazole 40 mg daily - 2 daily Ondansetron 4 mg PRN -Chronic PPI use is not ideal given bone density issues; however pt has severe symptoms if she does not take PPI -Recommended to continue current medication  Chronic constipation (Goal: manage symptoms) -Controlled - pt is not taking Linzess consistently (she was using samples);  -Current treatment  Linzess 145 mcg daily  PRN -Recommended to continue current medication; advised to contact GI for additional samples if needed  Health Maintenance -Vaccine gaps: TDAP, Shingrix, covid booster -Current therapy:  Excedrine Migraine (ASA-APAP-caffeine) -Patient is satisfied with current therapy and denies issues -Recommended to continue current medication  Patient Goals/Self-Care Activities Patient will:  - take medications as prescribed focus on medication adherence by pill box check blood pressure daily -Return to urgent care for worsening respiratory symptoms      Ms. Yost was given information about Chronic Care Management services today  including:  CCM service includes personalized support from designated clinical staff supervised by her physician, including individualized plan of care and coordination with other care providers 24/7 contact phone numbers for assistance for urgent and routine care needs. Standard insurance, coinsurance, copays and deductibles apply for chronic care management only during months in which we provide at least 20 minutes of these services. Most insurances cover these services at 100%, however patients may be responsible for any copay, coinsurance and/or deductible if applicable. This service may help you avoid the need for more expensive face-to-face services. Only one practitioner may furnish and bill the service in a calendar month. The patient may stop CCM services at any time (effective at the end of the month) by phone call to the office staff.  Patient agreed to services and verbal consent obtained.   The patient verbalized understanding of instructions, educational materials, and care plan provided today and declined offer to receive copy of patient instructions, educational materials, and care plan.  Telephone follow up appointment with pharmacy team member scheduled for: 1 month  Charlene Brooke, PharmD, [place], CPP Clinical Pharmacist [facility] at [facility] [phone]

## 2021-09-24 DIAGNOSIS — I509 Heart failure, unspecified: Secondary | ICD-10-CM | POA: Diagnosis not present

## 2021-09-24 DIAGNOSIS — J449 Chronic obstructive pulmonary disease, unspecified: Secondary | ICD-10-CM | POA: Diagnosis not present

## 2021-09-27 ENCOUNTER — Ambulatory Visit: Payer: Medicare HMO | Admitting: Pharmacist

## 2021-09-27 ENCOUNTER — Other Ambulatory Visit: Payer: Self-pay

## 2021-09-27 DIAGNOSIS — I10 Essential (primary) hypertension: Secondary | ICD-10-CM

## 2021-09-27 DIAGNOSIS — Z8673 Personal history of transient ischemic attack (TIA), and cerebral infarction without residual deficits: Secondary | ICD-10-CM

## 2021-09-27 DIAGNOSIS — I5032 Chronic diastolic (congestive) heart failure: Secondary | ICD-10-CM

## 2021-09-27 DIAGNOSIS — E782 Mixed hyperlipidemia: Secondary | ICD-10-CM

## 2021-09-27 MED ORDER — CARVEDILOL 6.25 MG PO TABS: 6.2500 mg | ORAL_TABLET | Freq: Two times a day (BID) | ORAL | 1 refills | Status: DC

## 2021-09-27 MED ORDER — ASPIRIN 81 MG PO TBEC: 81.0000 mg | DELAYED_RELEASE_TABLET | Freq: Every day | ORAL | 3 refills | Status: DC

## 2021-09-27 MED ORDER — ATORVASTATIN CALCIUM 40 MG PO TABS: 40.0000 mg | ORAL_TABLET | Freq: Every day | ORAL | 1 refills | Status: DC

## 2021-09-27 NOTE — Patient Instructions (Signed)
Visit Information  Phone number for Pharmacist: [phone]   Goals Addressed   None     Care Plan : [place]  Updates made by Charlton Haws, [facility] since 09/27/2021 12:00 AM     Problem: Hypertension, Hyperlipidemia, Heart Failure, GERD, COPD, Chronic Kidney Disease, Depression, Anxiety, and Osteoporosis, hx CVA   Priority: High     Long-Range Goal: Disease management   Start Date: 09/22/2021  Expected End Date: 09/22/2022  This Visit's Progress: Not on track  Recent Progress: Not on track  Priority: High  Note:   Current Barriers:  Unable to independently monitor therapeutic efficacy Suboptimal therapeutic regimen for HTN/heart failure and HLD  Pharmacist Clinical Goal(s):  Patient will achieve adherence to monitoring guidelines and medication adherence to achieve therapeutic efficacy adhere to plan to optimize therapeutic regimen for HTN/HF and HLD as evidenced by report of adherence to recommended medication management changes through collaboration with PharmD and provider.   Interventions: 1:1 collaboration with [name], MD regarding development and update of comprehensive plan of care as evidenced by provider attestation and co-signature Inter-disciplinary care team collaboration (see longitudinal plan of care) Comprehensive medication review performed; medication list updated in electronic medical record  Hyperlipidemia: (LDL goal < 70) -Not ideally controlled - pt is not taking a statin; she was previously on atorvastatin 40 mg but has not had this filled in over a year (07/2020), she is not sure why; per cardiology notes pt is supposed to still be taking atorvastatin; LDL was at goal in 12/2018 when last checked, while pt was taking a statin; lipids have not been checked since she has been off the statin; she is amenable to restarting atorvastatin 40 mg and aspirin 81 mg -Hx CVA -Current treatment: None -Medications previously tried:  atorvastatin 40 mg  -Current exercise habits: none - pain and SOB -Educated on Cholesterol goals; Benefits of statin for ASCVD risk reduction; given history of CVA it is recommended she take a high-intensity statin, she seemed to tolerate atorvastatin 40 mg previously -Consulted with PCP and in agreement to restart atorvastatin 40 mg and aspirin 81 mg Plan: ordered atorvastatin 40 mg daily and aspirin 81 mg daily. Recommend lipid panel at next PCP or cardiology visit  Heart Failure / Hypertension (Goal: BP < 140/90 and prevent exacerbations) -Not ideally controlled - Pt reports BP at home has been high; she is amenable to restarting carvedilol 6.25 mg BID (previously prescribed by cardiology and discontinued per patient preference for unclear reasons) -Pt is taking furosemide BID currently due to excess swelling; she is not weighing herself; she is taking potassium with each furosemide dose -Current home BP/HR readings: "high" > 140/90 -Last ejection fraction: 55-65% (Date: 08/23/2018) -HF type: Diastolic -Current treatment: Valsartan 320 mg daily Carvedilol 6.25 mg BID - not taking Furosemide 40 mg daily PRN - taking BID currently Potassium chloride 20 meq w/ lasix -Medications previously tried: amlodipine, carvedilol, spironolactone, telmisartan, HCTZ -Educated on Benefits of medications for managing symptoms and prolonging life; Proper diuretic administration and potassium supplementation; Importance of blood pressure control -Counseled on limitations of office BP monitoring alone, benefits of home BP monitoring -Recommended to obtain a BP monitor and check BP at home daily -Consulted with PCP and in agreement to restart carvedilol 6.25 mg BID if BP can tolerate -Plan: Ordered carvedilol 6.25 mg BID  COPD (Goal: control symptoms and prevent exacerbations) -Improving- pt was seen urgent care 9/27 and 10/5 for URI symptoms, covid and strep tests were  negative; urgent care provider had low  suspicion for COPD exacerbation; she is feeling better now on Augmentin and second course of prednisone;  -Current treatment  Albuterol HFA Cetirizine 10 mg daily Supplemental O2 (noctural) -Gold Grade: unknown (no PFTs on file) -Pulmonary function testing: not on file -Exacerbations requiring treatment in last 6 months: 1? -Frequency of rescue inhaler use: daily - Pt may benefit from a maintenance inhaler - consider adding in future  Depression/Anxiety (Goal: manage symtoms) -Controlled - pt reports mood is under control, current doses are fine -Current treatment: Fluoxetine 40 mg - 1 cap daily Hydroxyzine 25 mg q8h PRN - takes 1 daily Mirtazapine 15 mg HS -Medications previously tried/failed: Abilify, duloxetine, hydroxyzine -PHQ9: 8 (08/2021) -GAD7: not on file -Recommended to continue current medication  Osteopenia (Goal prevent fractures) -Not ideally controlled - pt has been prescribed alendronate but she not started it yet due to GI issues -Last DEXA Scan: 08/10/21  T-Score total hip: -2.0  T-Score lumbar spine: -1.6  10-year probability of major osteoporotic fracture: 17.6%  10-year probability of hip fracture: 3.0% -Patient is a candidate for pharmacologic treatment due to history of vertebral fracture and T-Score -1.0 to -2.5 and 10-year risk of hip fracture > 3% -Current treatment  Alendronate 70 mg weekly (prescribed 08/2021, not started yet) -Recommend [phone] units of vitamin D daily. Recommend 1200 mg of calcium daily from dietary and supplemental sources. Counseled on oral bisphosphonate administration: take in the morning, 30 minutes prior to food with 6-8 oz of water. Do not lie down for at least 30 minutes after taking.  Back pain (Goal: manage pain) -Controlled -hx multiple falls, T10 compression fracture s/p kyphoplasty 07/20/21 -Current treatment: Gabapentin 400 mg TID prn Meloxicam 7.5 mg daily -Recommended to continue current medication  GERD / dysphagia  / PUD (Goal: manage symptoms) -Controlled -hx pyloric stenosis. Follows w/ GI. -Current treatment  Omeprazole 40 mg daily - 2 daily Ondansetron 4 mg PRN -Chronic PPI use is not ideal given bone density issues; however pt has severe symptoms if she does not take PPI -Recommended to continue current medication  Chronic constipation (Goal: manage symptoms) -Controlled - pt is not taking Linzess consistently (she was using samples);  -Current treatment  Linzess 145 mcg daily PRN -Recommended to continue current medication; advised to contact GI for additional samples if needed  Health Maintenance -Vaccine gaps: TDAP, Shingrix, covid booster -Current therapy:  Excedrine Migraine (ASA-APAP-caffeine) -Patient is satisfied with current therapy and denies issues -Recommended to continue current medication  Patient Goals/Self-Care Activities Patient will:  - take medications as prescribed focus on medication adherence by pill box check blood pressure daily -Return to urgent care for worsening respiratory symptoms -Restart atorvastatin 40 mg daily, aspirin 81 mg daily, and carvedilol 6.25 mg twice daily      The patient verbalized understanding of instructions, educational materials, and care plan provided today and declined offer to receive copy of patient instructions, educational materials, and care plan.  Telephone follow up appointment with pharmacy team member scheduled for: 3 weeks  Charlene Brooke, PharmD, [place], CPP Clinical Pharmacist [facility] at [facility] [phone]

## 2021-09-27 NOTE — Progress Notes (Signed)
Chronic Care Management Pharmacy Note  09/27/2021 Name:  Morgan [name] MRN:  [id number] DOB:  01/24/1951  Summary: -Pt had previously stopped taking atorvastatin, aspirin and carvedilol due to confusion and misunderstanding doctor's instructions. Consulted with PCP, in agreement to restart medications. -Pt is amenable to restarting atorvastatin 40 mg, aspirin 81 mg, and carvedilol 6.25 mg BID -Pt reports BP has been high at home > 140/90  Recommendations/Changes made from today's visit: -Ordered atorvastatin 40 mg, carvedilol 6.25 mg BID and [facility] -Recommend repeat lipid panel at next PCP or cardiology visit -F/U call with PharmD ~3 weeks to assess compliance/tolerability  Subjective: Morgan [name] is an 70 y.o. year old female who is a primary patient of Cody, Jobe Marker, MD.  The CCM team was consulted for assistance with disease management and care coordination needs.    Engaged with patient by telephone for follow up visit in response to provider referral for pharmacy case management and/or care coordination services.   Consent to Services:  The patient was given information about Chronic Care Management services, agreed to services, and gave verbal consent prior to initiation of services.  Please see initial visit note for detailed documentation.   Patient Care Team: [name], MD as PCP - General (Family Medicine) Charlton Haws, [facility] as Pharmacist (Pharmacist)  Recent office visits: 08/28/21 [name], MD- PCP: chronic f/u; CHF exacerbation - increase lasix to 40 mg BID x 5 days, start Kcl 20 meq w/ lasix. C/o pyloric stenosis, advised to contact GI, hold starting alendronate until sx resolve. Low HGB - referred to hematology.   06/26/21 [name], MD- PCP: acute visit s/p fall, back pain. Fell ~6/27. Rx'd oxycodone for pain. Referral to ortho for severe pain response.  Recent consult visits: 09/20/21 Urgent Care: sinusitis - rx'd  Augmentin, prednisone 20 mg. Low suspicion COPD exacerbation.  09/12/21 Urgent care: c/o sore throat. Dx viral URI. Covid, strep negative.  Meds: cetirizine 10 mg daily, and prednisone 30 mg daily   09/11/21 [name], MD (Heme/onc): new pt - eval IDA. RTC 2 weeks, +/- Venofer. (Avoid oral iron d/t chronic constipation)   09/06/21  Locklear, [name], MD (GI [name]): f/u chronic nausea, dysphagia. Ordered barium swallow, NM gastric emptying study.  07/31/21 [name], MD (ortho - [name]): f/u T10 compression fx.  07/19/21 [name], MD  (ortho [name]): f/u T10 compression fx. Schedule kyphoplasty tomorrow and vertebroplasty soon.  07/05/21 [name], PA (Ortho [name]): initial visit for compression fracture.  06/12/21 Urgent Care - Pneumonia, rib pain. Rx'd Augmentin, Doxycycline, ibuprofen, tizanidine.  05/11/21 Flossie Dibble, MD (cardiology - [name]): f/u HTN, aortic aneurysm. Increase valsartan to 320 mg daily.   Hospital visits: Medication Reconciliation was completed by comparing discharge summary, patient's EMR and Pharmacy list, and upon discussion with patient.  Admitted to the hospital on 08/23/21 due to T9 Kyphoplasty. Discharge date was 08/23/21. Discharged from [facility].     Medications that remain the same after Hospital Discharge:??  -All other medications will remain the same.   Admitted to the hospital on 07/20/21 due to T10 kyphoplasty. Discharge date was 07/20/21. Discharged from [facility].    Medications Discontinued at Hospital Discharge: - abilfy, metamucil, potassium, sucralfate and tizanidine  Medications that remain the same after Hospital Discharge:??  -All other medications will remain the same.   Admitted to the ED on 07/01/21  due to fall. Discharge date was 07/01/21. Discharged from [place] and CT negative for acute  abnormalities  Medications that remain the same after Hospital Discharge:??  -All other medications will remain the same.   Admitted to the ED on 03/26/21 due to Knee sprain. Discharge date was 03/26/21. Discharged from [facility] ED   Medications Started at Hospital Discharge: - oxycodone 5 mg   Objective:  Lab Results  Component Value Date   CREATININE 1.01 08/29/2021   BUN 16 08/29/2021   GFR 57.33 (L) 08/29/2021   GFRNONAA 55 (L) 02/20/2021   GFRAA >60 09/02/2020   NA 141 08/29/2021   K 3.2 (L) 08/29/2021   CALCIUM 9.0 08/29/2021   CO2 39 (H) 08/29/2021   GLUCOSE 116 (H) 08/29/2021    Lab Results  Component Value Date/Time   HGBA1C 5.8 (H) 10/05/2017 03:50 AM   GFR 57.33 (L) 08/29/2021 09:31 AM   GFR 37.68 (L) 10/20/2020 03:11 PM    Last diabetic Eye exam: No results found for: HMDIABEYEEXA  Last diabetic Foot exam: No results found for: HMDIABFOOTEX   Lab Results  Component Value Date   CHOL 137 12/26/2018   HDL 49 12/26/2018   LDLCALC 69 12/26/2018   TRIG 96 12/26/2018   CHOLHDL 4.3 10/05/2017    Hepatic Function Latest Ref Rng & Units 08/29/2021 02/05/2021 10/20/2020  Total Protein 6.0 - 8.3 g/dL 6.5 6.0(L) 6.7  Albumin 3.5 - 5.2 g/dL 3.5 3.0(L) 3.7  AST 0 - 37 U/L _0 ALT 0 - 35 U/L _1 Alk Phosphatase 39 - 117 U/L 152(H) 109 109  Total Bilirubin 0.2 - 1.2 mg/dL 0.5 0.3 0.2  Bilirubin, Direct 0.0 - 0.2 mg/dL - - -    Lab Results  Component Value Date/Time   TSH 2.46 08/29/2021 09:31 AM   TSH 4.591 (H) 08/10/2020 05:49 AM   TSH 1.89 12/26/2018 12:00 AM   FREET4 1.01 08/29/2021 09:31 AM    CBC Latest Ref Rng & Units 09/11/2021 08/29/2021 02/20/2021  WBC 4.0 - 10.5 K/uL 6.1 7.7 8.0  Hemoglobin 12.0 - 15.0 g/dL 10.5(L) 9.9(L) 10.0(L)  Hematocrit 36.0 - 46.0 % 35.5(L) 31.1(L) 32.3(L)  Platelets 150 - 400 K/uL 344 312.0 343   Iron/TIBC/Ferritin/ %Sat    Component Value Date/Time   IRON 24 (L) 09/11/2021 1113   TIBC 351  09/11/2021 1113   FERRITIN 42 09/11/2021 1113   IRONPCTSAT 7 (L) 09/11/2021 1113    Lab Results  Component Value Date/Time   VD25OH 38.85 08/29/2021 09:31 AM    Clinical ASCVD: Yes  The ASCVD Risk score (Arnett DK, et al., 2019) failed to calculate for the following reasons:   The patient has a prior MI or stroke diagnosis    Depression screen [facility] 2/9 08/28/2021 11/12/2018  Decreased Interest 1 0  Down, Depressed, Hopeless 1 0  PHQ - 2 Score 2 0  Altered sleeping 1 -  Tired, decreased energy 1 -  Change in appetite 1 -  Feeling bad or failure about yourself  1 -  Trouble concentrating 1 -  Moving slowly or fidgety/restless 1 -  Suicidal thoughts 0 -  PHQ-9 Score 8 -  Difficult doing work/chores Somewhat difficult -  Some recent data might be hidden     Social History   Tobacco Use  Smoking Status Former   Packs/day: 1.00   Years: 15.00   Pack years: 15.00   Types: Cigarettes  Quit date: 09/12/2017   Years since quitting: 4.0  Smokeless Tobacco Never   BP Readings from Last 3 Encounters:  09/20/21 (!) 164/84  09/20/21 (!) 178/98  09/20/21 (!) 171/93   Pulse Readings from Last 3 Encounters:  09/20/21 91  09/20/21 78  09/20/21 77   Wt Readings from Last 3 Encounters:  09/20/21 235 lb 1.6 oz (106.6 kg)  09/11/21 231 lb 12.8 oz (105.1 kg)  08/28/21 238 lb 2 oz (108 kg)   BMI Readings from Last 3 Encounters:  09/20/21 31.02 kg/m  09/11/21 30.58 kg/m  08/28/21 31.42 kg/m    Assessment/Interventions: Review of patient past medical history, allergies, medications, health status, including review of consultants reports, laboratory and other test data, was performed as part of comprehensive evaluation and provision of chronic care management services.   SDOH:  (Social Determinants of Health) assessments and interventions performed: Yes  SDOH Screenings   Alcohol Screen: Not on file  Depression (PHQ2-9): Medium Risk   PHQ-2 Score: 8  Financial Resource  Strain: Not on file  Food Insecurity: Not on file  Housing: Not on file  Physical Activity: Not on file  Social Connections: Not on file  Stress: Not on file  Tobacco Use: Medium Risk   Smoking Tobacco Use: Former   Smokeless Tobacco Use: Never  Transportation Needs: Not on file    False Pass  Allergies  Allergen Reactions   Meperidine Hives and Anaphylaxis   Strawberry Extract Hives and Swelling   Carafate [Sucralfate] Nausea Only and Other (See Comments)    Severe nausea   Wasp Venom Hives    Medications Reviewed Today     Reviewed by Alexia Freestone, RN (Registered Nurse) on 09/20/21 at [place] List Status: <None>   Medication Order Taking? Sig Documenting Provider Last Dose Status Informant  albuterol (VENTOLIN HFA) 108 (90 Base) MCG/ACT inhaler [id number]  Inhale 1-2 puffs into the lungs every 6 (six) hours as needed for wheezing or shortness of breath. Ok Edwards, PA-C  Active Self  alendronate (FOSAMAX) 70 MG tablet [id number]  Take 1 tablet (70 mg total) by mouth every 7 (seven) days. Take with a full glass of water on an empty stomach. [name], MD  Active   aspirin-acetaminophen-caffeine [facility] MIGRAINE) [phone] MG tablet [id number]  Take 1-2 tablets by mouth every 6 (six) hours as needed for headache.  Patient not taking: Reported on 09/20/2021   [provider]  Active Self  cetirizine (ZYRTEC ALLERGY) 10 MG tablet [id number]  Take 1 tablet (10 mg total) by mouth daily. Jaynee Eagles, PA-C  Active   FLUoxetine (PROZAC) 40 MG capsule [id number]  Take 40 mg by mouth daily. [provider]  Active Self  furosemide (LASIX) 40 MG tablet [id number]  Take 1 tablet (40 mg total) by mouth daily as needed for fluid or edema.  Patient taking differently: Take 40 mg by mouth 2 (two) times daily.   Mercy Riding, MD  Active   gabapentin (NEURONTIN) 400 MG capsule [id number]  Take 400 mg by mouth 3 (three) times daily as needed (for nerve pain). [provider]  Active Self  hydrOXYzine (ATARAX/VISTARIL) 25 MG tablet [id number]  Take 25 mg by mouth every 8 (eight) hours as needed for anxiety. [provider]  Active Self  ibuprofen (ADVIL) 600 MG tablet [id number]  Take 1 tablet (600 mg total) by mouth every 6 (six) hours as needed.  Patient not taking: Reported on 09/20/2021  Melynda Ripple, MD  Active Self  mirtazapine (REMERON) 15 MG tablet [id number]  Take 15 mg by mouth at bedtime. [provider]  Active Self  omeprazole (PRILOSEC) 40 MG capsule [id number]  TAKE 1 CAPSULE BY MOUTH EVERY DAY  Patient taking differently: Take 80 mg by mouth daily.   [name], MD  Active   ondansetron (ZOFRAN-ODT) 4 MG disintegrating tablet [id number]  Take 1-2 tablets by mouth every 6 hours as needed Levin Erp, [place]  Active Self  OXYGEN [id number]  Inhale 2 L/min into the lungs See admin instructions. 2 L/min at bedtime and as needed for shortness of breath throughout the day [provider]  Active Self  potassium chloride SA (KLOR-CON) 20 MEQ tablet [id number]  Take 1 tablet (20 mEq total) by mouth daily.  Patient taking differently: Take 20 mEq by mouth daily. With furosemide   [name], MD  Active   valsartan (DIOVAN) 320 MG tablet [id number]  Take 320 mg by mouth daily. [provider]  Active Self            Patient Active Problem List   Diagnosis Date Noted   Microcytic anemia 09/11/2021   Stage 3a chronic kidney disease ([place]) 09/11/2021   IDA (iron deficiency anemia) 09/11/2021   Elevated TSH 08/28/2021   Osteopenia 08/28/2021   DOE (dyspnea on exertion) 08/28/2021   Acute midline thoracic back pain 06/26/2021   Other chronic pain 06/26/2021   Chest pressure 02/20/2021   Left sided abdominal pain 02/01/2021   Chronic diastolic CHF (congestive heart failure) ([place]) 08/09/2020   COPD (chronic obstructive pulmonary disease) ([place]) 08/09/2020   Chronic respiratory failure with  hypoxia ([place]) 08/09/2020   Polyp of sigmoid colon    Polyp of cecum    Gastritis and gastroduodenitis    Pylorus ulcer    Pyloric stenosis in adult    Chronic cholecystitis 05/19/2020   Oxygen dependent 03/30/2020   Nausea and vomiting in adult 03/22/2020   Peptic ulcer disease 03/22/2020   Iron deficiency anemia 03/22/2020   Acute pain of right knee 12/29/2019   Acute left-sided low back pain without sciatica 12/29/2019   Fall 04/08/2019   Chronic abdominal pain 02/19/2019   Asymptomatic bacteriuria 02/19/2019   CKD (chronic kidney disease) stage 3, GFR 30-59 ml/min ([place]) 01/13/2019   CHF (congestive heart failure) ([place]) 01/13/2019   Dizziness 01/13/2019   Hoarseness 11/12/2018   Dysphagia 11/12/2018   Sleepiness 11/12/2018   CVA (cerebral vascular accident) ([place]) 08/22/2018   Left-sided weakness 10/04/2017   HLD (hyperlipidemia) 10/04/2017   GERD (gastroesophageal reflux disease) 10/04/2017   Anxiety 08/26/2017   Essential hypertension    Depression    Diarrhea 02/22/2015   Hypokalemia 02/22/2015   Chronic lower back pain 04/29/2013   Paresthesia 04/29/2013    Immunization History  Administered Date(s) Administered   Fluad Quad(high Dose 65+) 09/30/2019, 08/29/2020, 08/28/2021   Influenza, High Dose Seasonal PF 08/23/2018   Influenza,inj,Quad PF,6+ Mos 02/24/2015   Moderna Sars-Covid-2 Vaccination 03/18/2020, 04/15/2020   Pneumococcal Conjugate-13 11/12/2018   Pneumococcal Polysaccharide-23 02/24/2015    Conditions to be addressed/monitored:  Hypertension, Hyperlipidemia, Heart Failure, GERD, COPD, Chronic Kidney Disease, Depression, Anxiety, and Osteoporosis, hx CVA  Care Plan : [place]  Updates made by Charlton Haws, [place] since 09/27/2021 12:00 AM     Problem: Hypertension, Hyperlipidemia, Heart Failure, GERD, COPD, Chronic Kidney Disease, Depression, Anxiety, and Osteoporosis, hx CVA   Priority: High  Long-Range Goal: Disease  management   Start Date: 09/22/2021  Expected End Date: 09/22/2022  This Visit's Progress: Not on track  Recent Progress: Not on track  Priority: High  Note:   Current Barriers:  Unable to independently monitor therapeutic efficacy Suboptimal therapeutic regimen for HTN/heart failure and HLD  Pharmacist Clinical Goal(s):  Patient will achieve adherence to monitoring guidelines and medication adherence to achieve therapeutic efficacy adhere to plan to optimize therapeutic regimen for HTN/HF and HLD as evidenced by report of adherence to recommended medication management changes through collaboration with PharmD and provider.   Interventions: 1:1 collaboration with [name], MD regarding development and update of comprehensive plan of care as evidenced by provider attestation and co-signature Inter-disciplinary care team collaboration (see longitudinal plan of care) Comprehensive medication review performed; medication list updated in electronic medical record  Hyperlipidemia: (LDL goal < 70) -Not ideally controlled - pt is not taking a statin; she was previously on atorvastatin 40 mg but has not had this filled in over a year (07/2020), she is not sure why; per cardiology notes pt is supposed to still be taking atorvastatin; LDL was at goal in 12/2018 when last checked, while pt was taking a statin; lipids have not been checked since she has been off the statin; she is amenable to restarting atorvastatin 40 mg and aspirin 81 mg -Hx CVA -Current treatment: None -Medications previously tried: atorvastatin 40 mg  -Current exercise habits: none - pain and SOB -Educated on Cholesterol goals; Benefits of statin for ASCVD risk reduction; given history of CVA it is recommended she take a high-intensity statin, she seemed to tolerate atorvastatin 40 mg previously -Consulted with PCP and in agreement to restart atorvastatin 40 mg and aspirin 81 mg Plan: ordered atorvastatin 40 mg daily and  aspirin 81 mg daily. Recommend lipid panel at next PCP or cardiology visit  Heart Failure / Hypertension (Goal: BP < 140/90 and prevent exacerbations) -Not ideally controlled - Pt reports BP at home has been high; she is amenable to restarting carvedilol 6.25 mg BID (previously prescribed by cardiology and discontinued per patient preference for unclear reasons) -Pt is taking furosemide BID currently due to excess swelling; she is not weighing herself; she is taking potassium with each furosemide dose -Current home BP/HR readings: "high" > 140/90 -Last ejection fraction: 55-65% (Date: 08/23/2018) -HF type: Diastolic -Current treatment: Valsartan 320 mg daily Carvedilol 6.25 mg BID - not taking Furosemide 40 mg daily PRN - taking BID currently Potassium chloride 20 meq w/ lasix -Medications previously tried: amlodipine, carvedilol, spironolactone, telmisartan, HCTZ -Educated on Benefits of medications for managing symptoms and prolonging life; Proper diuretic administration and potassium supplementation; Importance of blood pressure control -Counseled on limitations of office BP monitoring alone, benefits of home BP monitoring -Recommended to obtain a BP monitor and check BP at home daily -Consulted with PCP and in agreement to restart carvedilol 6.25 mg BID if BP can tolerate -Plan: Ordered carvedilol 6.25 mg BID  COPD (Goal: control symptoms and prevent exacerbations) -Improving- pt was seen urgent care 9/27 and 10/5 for URI symptoms, covid and strep tests were negative; urgent care provider had low suspicion for COPD exacerbation; she is feeling better now on Augmentin and second course of prednisone;  -Current treatment  Albuterol HFA Cetirizine 10 mg daily Supplemental O2 (noctural) -Gold Grade: unknown (no PFTs on file) -Pulmonary function testing: not on file -Exacerbations requiring treatment in last 6 months: 1? -Frequency of rescue inhaler use: daily - Pt may  benefit from a  maintenance inhaler - consider adding in future  Depression/Anxiety (Goal: manage symtoms) -Controlled - pt reports mood is under control, current doses are fine -Current treatment: Fluoxetine 40 mg - 1 cap daily Hydroxyzine 25 mg q8h PRN - takes 1 daily Mirtazapine 15 mg HS -Medications previously tried/failed: Abilify, duloxetine, hydroxyzine -PHQ9: 8 (08/2021) -GAD7: not on file -Recommended to continue current medication  Osteopenia (Goal prevent fractures) -Not ideally controlled - pt has been prescribed alendronate but she not started it yet due to GI issues -Last DEXA Scan: 08/10/21  T-Score total hip: -2.0  T-Score lumbar spine: -1.6  10-year probability of major osteoporotic fracture: 17.6%  10-year probability of hip fracture: 3.0% -Patient is a candidate for pharmacologic treatment due to history of vertebral fracture and T-Score -1.0 to -2.5 and 10-year risk of hip fracture > 3% -Current treatment  Alendronate 70 mg weekly (prescribed 08/2021, not started yet) -Recommend [phone] units of vitamin D daily. Recommend 1200 mg of calcium daily from dietary and supplemental sources. Counseled on oral bisphosphonate administration: take in the morning, 30 minutes prior to food with 6-8 oz of water. Do not lie down for at least 30 minutes after taking.  Back pain (Goal: manage pain) -Controlled -hx multiple falls, T10 compression fracture s/p kyphoplasty 07/20/21 -Current treatment: Gabapentin 400 mg TID prn Meloxicam 7.5 mg daily -Recommended to continue current medication  GERD / dysphagia / PUD (Goal: manage symptoms) -Controlled -hx pyloric stenosis. Follows w/ GI. -Current treatment  Omeprazole 40 mg daily - 2 daily Ondansetron 4 mg PRN -Chronic PPI use is not ideal given bone density issues; however pt has severe symptoms if she does not take PPI -Recommended to continue current medication  Chronic constipation (Goal: manage symptoms) -Controlled - pt is not taking  Linzess consistently (she was using samples);  -Current treatment  Linzess 145 mcg daily PRN -Recommended to continue current medication; advised to contact GI for additional samples if needed  Health Maintenance -Vaccine gaps: TDAP, Shingrix, covid booster -Current therapy:  Excedrine Migraine (ASA-APAP-caffeine) -Patient is satisfied with current therapy and denies issues -Recommended to continue current medication  Patient Goals/Self-Care Activities Patient will:  - take medications as prescribed focus on medication adherence by pill box check blood pressure daily -Return to urgent care for worsening respiratory symptoms -Restart atorvastatin 40 mg daily, aspirin 81 mg daily, and carvedilol 6.25 mg twice daily       Medication Assistance: None required.  Patient affirms current coverage meets needs.  Compliance/Adherence/Medication fill history: Care Gaps: Mammogram (07/16/19)  Star-Rating Drugs: Valsartan - LF 08/14/21 x 90 ds  [facility]  [id number] [place] [id number]: [phone]: [address]Phone: [phone]: [phone] Uses pill box? No - prefers bottles Pt endorses 100% compliance  We discussed: Current pharmacy is preferred with insurance plan and patient is satisfied with pharmacy services Patient decided to: Continue current medication management strategy  Care Plan and Follow Up Patient Decision:  Patient agrees to Care Plan and Follow-up.  Plan: Telephone follow up appointment with care management team member scheduled for:  1 month  LCharlene Brooke PharmD, BDuffield CPP Clinical Pharmacist [facility] [phone]

## 2021-09-28 ENCOUNTER — Ambulatory Visit: Payer: Medicare HMO

## 2021-09-28 ENCOUNTER — Inpatient Hospital Stay: Payer: Medicare HMO

## 2021-09-28 ENCOUNTER — Ambulatory Visit: Payer: Medicare HMO | Admitting: Oncology

## 2021-09-28 VITALS — BP 165/81 | HR 85 | Resp 18

## 2021-09-28 DIAGNOSIS — I341 Nonrheumatic mitral (valve) prolapse: Secondary | ICD-10-CM | POA: Diagnosis not present

## 2021-09-28 DIAGNOSIS — I129 Hypertensive chronic kidney disease with stage 1 through stage 4 chronic kidney disease, or unspecified chronic kidney disease: Secondary | ICD-10-CM | POA: Diagnosis not present

## 2021-09-28 DIAGNOSIS — R5383 Other fatigue: Secondary | ICD-10-CM | POA: Diagnosis not present

## 2021-09-28 DIAGNOSIS — Z8041 Family history of malignant neoplasm of ovary: Secondary | ICD-10-CM | POA: Diagnosis not present

## 2021-09-28 DIAGNOSIS — D509 Iron deficiency anemia, unspecified: Secondary | ICD-10-CM | POA: Diagnosis not present

## 2021-09-28 DIAGNOSIS — Z79899 Other long term (current) drug therapy: Secondary | ICD-10-CM | POA: Diagnosis not present

## 2021-09-28 DIAGNOSIS — Z803 Family history of malignant neoplasm of breast: Secondary | ICD-10-CM | POA: Diagnosis not present

## 2021-09-28 DIAGNOSIS — D508 Other iron deficiency anemias: Secondary | ICD-10-CM

## 2021-09-28 DIAGNOSIS — F419 Anxiety disorder, unspecified: Secondary | ICD-10-CM | POA: Diagnosis not present

## 2021-09-28 DIAGNOSIS — Z87891 Personal history of nicotine dependence: Secondary | ICD-10-CM | POA: Diagnosis not present

## 2021-09-28 DIAGNOSIS — K219 Gastro-esophageal reflux disease without esophagitis: Secondary | ICD-10-CM | POA: Diagnosis not present

## 2021-09-28 DIAGNOSIS — N1831 Chronic kidney disease, stage 3a: Secondary | ICD-10-CM | POA: Diagnosis not present

## 2021-09-28 DIAGNOSIS — I509 Heart failure, unspecified: Secondary | ICD-10-CM | POA: Diagnosis not present

## 2021-09-28 DIAGNOSIS — D631 Anemia in chronic kidney disease: Secondary | ICD-10-CM | POA: Diagnosis not present

## 2021-09-28 DIAGNOSIS — K449 Diaphragmatic hernia without obstruction or gangrene: Secondary | ICD-10-CM | POA: Diagnosis not present

## 2021-09-28 DIAGNOSIS — R69 Illness, unspecified: Secondary | ICD-10-CM | POA: Diagnosis not present

## 2021-09-28 DIAGNOSIS — Z800 Family history of malignant neoplasm of digestive organs: Secondary | ICD-10-CM | POA: Diagnosis not present

## 2021-09-28 MED ORDER — SODIUM CHLORIDE 0.9 % IV SOLN: 200.0000 mg | Freq: Once | INTRAVENOUS | Status: DC

## 2021-09-28 MED ORDER — IRON SUCROSE 20 MG/ML IV SOLN
200.0000 mg | Freq: Once | INTRAVENOUS | Status: AC
  Administered 2021-09-28: 200 mg via INTRAVENOUS
  Filled 2021-09-28: qty 10

## 2021-09-28 MED ORDER — SODIUM CHLORIDE 0.9 % IV SOLN
Freq: Once | INTRAVENOUS | Status: AC
  Filled 2021-09-28: qty 250

## 2021-09-28 NOTE — Patient Instructions (Signed)
[facility]ENTER [place] [facility]  Discharge Instructions: Thank you for choosing [facility] to provide your oncology and hematology care.  If you have a lab appointment with the Cancer Center, please go directly to the Cancer Center and check in at the registration area.  Wear comfortable clothing and clothing appropriate for easy access to any Portacath or PICC line.   We strive to give you quality time with your provider. You may need to reschedule your appointment if you arrive late (15 or more minutes).  Arriving late affects you and other patients whose appointments are after yours.  Also, if you miss three or more appointments without notifying the office, you may be dismissed from the clinic at the provider's discretion.      For prescription refill requests, have your pharmacy contact our office and allow 72 hours for refills to be completed.    Today you received the following : Venofer   To help prevent nausea and vomiting after your treatment, we encourage you to take your nausea medication as directed.  BELOW ARE SYMPTOMS THAT SHOULD BE REPORTED IMMEDIATELY: . *FEVER GREATER THAN 100.4 F (38 C) OR HIGHER . *CHILLS OR SWEATING . *NAUSEA AND VOMITING THAT IS NOT CONTROLLED WITH YOUR NAUSEA MEDICATION . *UNUSUAL SHORTNESS OF BREATH . *UNUSUAL BRUISING OR BLEEDING . *URINARY PROBLEMS (pain or burning when urinating, or frequent urination) . *BOWEL PROBLEMS (unusual diarrhea, constipation, pain near the anus) . TENDERNESS IN MOUTH AND THROAT WITH OR WITHOUT PRESENCE OF ULCERS (sore throat, sores in mouth, or a toothache) . UNUSUAL RASH, SWELLING OR PAIN  . UNUSUAL VAGINAL DISCHARGE OR ITCHING   Items with * indicate a potential emergency and should be followed up as soon as possible or go to the Emergency Department if any problems should occur.  Please show the CHEMOTHERAPY ALERT CARD or IMMUNOTHERAPY ALERT CARD at check-in to the Emergency  Department and triage nurse.  Should you have questions after your visit or need to cancel or reschedule your appointment, please contact [facility] [place] [facility]  [phone] and follow the prompts.  Office hours are 8:00 a.m. to 4:30 p.m. Monday - Friday. Please note that voicemails left after 4:00 p.m. may not be returned until the following business day.  We are closed weekends and major holidays. You have access to a nurse at all times for urgent questions. Please call the main number to the clinic [phone] and follow the prompts.  For any non-urgent questions, you may also contact your provider using MyChart. We now offer e-Visits for anyone 18 and older to request care online for non-urgent symptoms. For details visit [email or web address].   Also download the MyChart app! Go to the app store, search "MyChart", open the app, select [place], and log in with your MyChart username and password.  Due to Covid, a mask is required upon entering the hospital/clinic. If you do not have a mask, one will be given to you upon arrival. For doctor visits, patients may have 1 support person aged 18 or older with them. For treatment visits, patients cannot have anyone with them due to current Covid guidelines and our immunocompromised population.  

## 2021-10-05 ENCOUNTER — Other Ambulatory Visit: Payer: Self-pay

## 2021-10-05 ENCOUNTER — Emergency Department: Payer: Medicare HMO

## 2021-10-05 ENCOUNTER — Inpatient Hospital Stay
Admission: EM | Admit: 2021-10-05 | Discharge: 2021-10-07 | DRG: 641 | Disposition: A | Discharge status: Home / Self Care | Payer: Medicare HMO | Attending: Internal Medicine | Admitting: Internal Medicine

## 2021-10-05 ENCOUNTER — Inpatient Hospital Stay: Payer: Medicare HMO

## 2021-10-05 ENCOUNTER — Telehealth: Payer: Self-pay | Admitting: Family Medicine

## 2021-10-05 DIAGNOSIS — K219 Gastro-esophageal reflux disease without esophagitis: Secondary | ICD-10-CM | POA: Diagnosis not present

## 2021-10-05 DIAGNOSIS — Z8249 Family history of ischemic heart disease and other diseases of the circulatory system: Secondary | ICD-10-CM | POA: Diagnosis not present

## 2021-10-05 DIAGNOSIS — Z91018 Allergy to other foods: Secondary | ICD-10-CM

## 2021-10-05 DIAGNOSIS — R0602 Shortness of breath: Secondary | ICD-10-CM

## 2021-10-05 DIAGNOSIS — Z91030 Bee allergy status: Secondary | ICD-10-CM | POA: Diagnosis not present

## 2021-10-05 DIAGNOSIS — I341 Nonrheumatic mitral (valve) prolapse: Secondary | ICD-10-CM | POA: Diagnosis not present

## 2021-10-05 DIAGNOSIS — E876 Hypokalemia: Secondary | ICD-10-CM | POA: Diagnosis not present

## 2021-10-05 DIAGNOSIS — R52 Pain, unspecified: Secondary | ICD-10-CM | POA: Diagnosis not present

## 2021-10-05 DIAGNOSIS — E860 Dehydration: Secondary | ICD-10-CM | POA: Diagnosis present

## 2021-10-05 DIAGNOSIS — I5032 Chronic diastolic (congestive) heart failure: Secondary | ICD-10-CM | POA: Diagnosis present

## 2021-10-05 DIAGNOSIS — I130 Hypertensive heart and chronic kidney disease with heart failure and stage 1 through stage 4 chronic kidney disease, or unspecified chronic kidney disease: Secondary | ICD-10-CM | POA: Diagnosis present

## 2021-10-05 DIAGNOSIS — Z87891 Personal history of nicotine dependence: Secondary | ICD-10-CM

## 2021-10-05 DIAGNOSIS — R112 Nausea with vomiting, unspecified: Secondary | ICD-10-CM | POA: Diagnosis not present

## 2021-10-05 DIAGNOSIS — T502X5A Adverse effect of carbonic-anhydrase inhibitors, benzothiadiazides and other diuretics, initial encounter: Secondary | ICD-10-CM | POA: Diagnosis not present

## 2021-10-05 DIAGNOSIS — D631 Anemia in chronic kidney disease: Secondary | ICD-10-CM | POA: Diagnosis not present

## 2021-10-05 DIAGNOSIS — R0689 Other abnormalities of breathing: Secondary | ICD-10-CM | POA: Diagnosis not present

## 2021-10-05 DIAGNOSIS — E785 Hyperlipidemia, unspecified: Secondary | ICD-10-CM | POA: Diagnosis not present

## 2021-10-05 DIAGNOSIS — R9431 Abnormal electrocardiogram [ECG] [EKG]: Secondary | ICD-10-CM | POA: Diagnosis not present

## 2021-10-05 DIAGNOSIS — Z79899 Other long term (current) drug therapy: Secondary | ICD-10-CM | POA: Diagnosis not present

## 2021-10-05 DIAGNOSIS — R69 Illness, unspecified: Secondary | ICD-10-CM | POA: Diagnosis not present

## 2021-10-05 DIAGNOSIS — Z743 Need for continuous supervision: Secondary | ICD-10-CM | POA: Diagnosis not present

## 2021-10-05 DIAGNOSIS — Z888 Allergy status to other drugs, medicaments and biological substances status: Secondary | ICD-10-CM

## 2021-10-05 DIAGNOSIS — Z7983 Long term (current) use of bisphosphonates: Secondary | ICD-10-CM

## 2021-10-05 DIAGNOSIS — Z7982 Long term (current) use of aspirin: Secondary | ICD-10-CM | POA: Diagnosis not present

## 2021-10-05 DIAGNOSIS — F32A Depression, unspecified: Secondary | ICD-10-CM | POA: Diagnosis present

## 2021-10-05 DIAGNOSIS — R0789 Other chest pain: Secondary | ICD-10-CM | POA: Diagnosis not present

## 2021-10-05 DIAGNOSIS — R404 Transient alteration of awareness: Secondary | ICD-10-CM | POA: Diagnosis not present

## 2021-10-05 DIAGNOSIS — N1831 Chronic kidney disease, stage 3a: Secondary | ICD-10-CM | POA: Diagnosis not present

## 2021-10-05 DIAGNOSIS — F419 Anxiety disorder, unspecified: Secondary | ICD-10-CM | POA: Diagnosis present

## 2021-10-05 DIAGNOSIS — Z20822 Contact with and (suspected) exposure to covid-19: Secondary | ICD-10-CM | POA: Diagnosis present

## 2021-10-05 DIAGNOSIS — R55 Syncope and collapse: Secondary | ICD-10-CM | POA: Diagnosis present

## 2021-10-05 DIAGNOSIS — I10 Essential (primary) hypertension: Secondary | ICD-10-CM | POA: Diagnosis present

## 2021-10-05 DIAGNOSIS — N183 Chronic kidney disease, stage 3 unspecified: Secondary | ICD-10-CM | POA: Diagnosis present

## 2021-10-05 DIAGNOSIS — J449 Chronic obstructive pulmonary disease, unspecified: Secondary | ICD-10-CM | POA: Diagnosis not present

## 2021-10-05 LAB — COMPREHENSIVE METABOLIC PANEL
ALT: 14 U/L (ref 0–44)
AST: 23 U/L (ref 15–41)
Albumin: 3 g/dL — ABNORMAL LOW (ref 3.5–5.0)
Alkaline Phosphatase: 109 U/L (ref 38–126)
Anion gap: 14 (ref 5–15)
BUN: 17 mg/dL (ref 8–23)
CO2: 33 mmol/L — ABNORMAL HIGH (ref 22–32)
Calcium: 8 mg/dL — ABNORMAL LOW (ref 8.9–10.3)
Chloride: 93 mmol/L — ABNORMAL LOW (ref 98–111)
Creatinine, Ser: 1.02 mg/dL — ABNORMAL HIGH (ref 0.44–1.00)
GFR, Estimated: 60 mL/min — ABNORMAL LOW (ref >60)
Glucose, Bld: 111 mg/dL — ABNORMAL HIGH (ref 70–99)
Potassium: 2.4 mmol/L — CL (ref 3.5–5.1)
Sodium: 140 mmol/L (ref 135–145)
Total Bilirubin: 0.8 mg/dL (ref 0.3–1.2)
Total Protein: 6.6 g/dL (ref 6.5–8.1)

## 2021-10-05 LAB — CBC WITH DIFFERENTIAL/PLATELET
Abs Immature Granulocytes: 0.03 10*3/uL (ref 0.00–0.07)
Basophils Absolute: 0 10*3/uL (ref 0.0–0.1)
Basophils Relative: 0 %
Eosinophils Absolute: 0.2 10*3/uL (ref 0.0–0.5)
Eosinophils Relative: 2 %
HCT: 33.1 % — ABNORMAL LOW (ref 36.0–46.0)
Hemoglobin: 10.3 g/dL — ABNORMAL LOW (ref 12.0–15.0)
Immature Granulocytes: 0 %
Lymphocytes Relative: 26 %
Lymphs Abs: 2.6 10*3/uL (ref 0.7–4.0)
MCH: 25 pg — ABNORMAL LOW (ref 26.0–34.0)
MCHC: 31.1 g/dL (ref 30.0–36.0)
MCV: 80.3 fL (ref 80.0–100.0)
Monocytes Absolute: 0.6 10*3/uL (ref 0.1–1.0)
Monocytes Relative: 6 %
Neutro Abs: 6.7 10*3/uL (ref 1.7–7.7)
Neutrophils Relative %: 66 %
Platelets: 289 10*3/uL (ref 150–400)
RBC: 4.12 MIL/uL (ref 3.87–5.11)
RDW: 18.3 % — ABNORMAL HIGH (ref 11.5–15.5)
WBC: 10.1 10*3/uL (ref 4.0–10.5)
nRBC: 0 % (ref 0.0–0.2)

## 2021-10-05 LAB — LIPASE, BLOOD: Lipase: 29 U/L (ref 11–51)

## 2021-10-05 LAB — RESP PANEL BY RT-PCR (FLU A&B, COVID) ARPGX2
Influenza A by PCR: NEGATIVE
Influenza B by PCR: NEGATIVE
SARS Coronavirus 2 by RT PCR: NEGATIVE

## 2021-10-05 LAB — TROPONIN I (HIGH SENSITIVITY)
Troponin I (High Sensitivity): 10 ng/L (ref <18)
Troponin I (High Sensitivity): 9 ng/L (ref <18)

## 2021-10-05 LAB — LACTIC ACID, PLASMA: Lactic Acid, Venous: 1.6 mmol/L (ref 0.5–1.9)

## 2021-10-05 MED ORDER — POTASSIUM CHLORIDE 10 MEQ/100ML IV SOLN
10.0000 meq | INTRAVENOUS | Status: AC
  Administered 2021-10-05 (×4): 10 meq via INTRAVENOUS
  Filled 2021-10-05 (×4): qty 100

## 2021-10-05 MED ORDER — ASPIRIN EC 81 MG PO TBEC
81.0000 mg | DELAYED_RELEASE_TABLET | Freq: Every day | ORAL | Status: DC
  Administered 2021-10-05 – 2021-10-07 (×3): 81 mg via ORAL
  Filled 2021-10-05 (×3): qty 1

## 2021-10-05 MED ORDER — GABAPENTIN 400 MG PO CAPS
400.0000 mg | ORAL_CAPSULE | Freq: Three times a day (TID) | ORAL | Status: DC | PRN
  Administered 2021-10-05 – 2021-10-06 (×2): 400 mg via ORAL
  Filled 2021-10-05 (×2): qty 1

## 2021-10-05 MED ORDER — HYDROXYZINE HCL 25 MG PO TABS: 25.0000 mg | ORAL_TABLET | Freq: Three times a day (TID) | ORAL | Status: DC | PRN

## 2021-10-05 MED ORDER — ATORVASTATIN CALCIUM 20 MG PO TABS
40.0000 mg | ORAL_TABLET | Freq: Every day | ORAL | Status: DC
  Administered 2021-10-05 – 2021-10-07 (×3): 40 mg via ORAL
  Filled 2021-10-05 (×3): qty 2

## 2021-10-05 MED ORDER — ONDANSETRON HCL 4 MG/2ML IJ SOLN
INTRAMUSCULAR | Status: AC
  Filled 2021-10-05: qty 2

## 2021-10-05 MED ORDER — SODIUM CHLORIDE 0.9 % IV SOLN: INTRAVENOUS | Status: AC

## 2021-10-05 MED ORDER — CARVEDILOL 6.25 MG PO TABS
6.2500 mg | ORAL_TABLET | Freq: Two times a day (BID) | ORAL | Status: DC
  Administered 2021-10-06 – 2021-10-07 (×3): 6.25 mg via ORAL
  Filled 2021-10-05 (×3): qty 1

## 2021-10-05 MED ORDER — ONDANSETRON HCL 4 MG PO TABS: 4.0000 mg | ORAL_TABLET | Freq: Four times a day (QID) | ORAL | Status: DC | PRN

## 2021-10-05 MED ORDER — MIRTAZAPINE 15 MG PO TABS
15.0000 mg | ORAL_TABLET | Freq: Every day | ORAL | Status: DC
  Administered 2021-10-05 – 2021-10-06 (×2): 15 mg via ORAL
  Filled 2021-10-05 (×2): qty 1

## 2021-10-05 MED ORDER — ONDANSETRON HCL 4 MG/2ML IJ SOLN
4.0000 mg | Freq: Four times a day (QID) | INTRAMUSCULAR | Status: DC | PRN
  Administered 2021-10-06: 4 mg via INTRAVENOUS
  Filled 2021-10-05: qty 2

## 2021-10-05 MED ORDER — ENOXAPARIN SODIUM 40 MG/0.4ML IJ SOSY
40.0000 mg | PREFILLED_SYRINGE | INTRAMUSCULAR | Status: DC
  Administered 2021-10-05: 40 mg via SUBCUTANEOUS
  Filled 2021-10-05: qty 0.4

## 2021-10-05 MED ORDER — IRBESARTAN 150 MG PO TABS
150.0000 mg | ORAL_TABLET | Freq: Every day | ORAL | Status: DC
  Administered 2021-10-06 – 2021-10-07 (×2): 150 mg via ORAL
  Filled 2021-10-05 (×2): qty 1

## 2021-10-05 NOTE — H&P (Signed)
History and Physical   Morgan [name] MEQ:[id number] DOB: [birth year] DOA: 10/05/2021  Referring MD/NP/PA: Dr. Leory Plowman  PCP: [name], MD   Outpatient Specialists: [facility] clinic cardiology  Patient coming from: Home  Chief Complaint: Passing out and chest pain  HPI: Morgan [name] is a 70 y.o. female with medical history significant of diastolic dysfunction CHF last echo in our system from September 2019, mitral valve prolapse, essential hypertension, GERD, depression, anxiety disorder: Osteoarthritis, iron deficiency anemia, chronic kidney disease stage IIIa among other things who is chronically on Lasix for her CHF and has been known to have hypokalemia as a result.  Patient apparently has not been taking her potassium because she does not want to take it.  She has not been feeling well lately having chest pain rated as 6 out of 10 which is substernal.  She was seen at the [facility] today where she was on a routine visit then started having the chest pain.  That was associated with some nausea and vomiting.  Her EKG showed intermittent bigeminy and while there patient apparently had an episode of loss of consciousness.  She was more less shaking at the time.  She was fully awake when she arrived the ER.  They did give her some sternal rub apparently.  Also aspirin and nitroglycerin.  Patient was found to have a potassium of 2.4.  She is very anxious.  At this point the thought processes patient had syncopal episode probably as a result of significant hypokalemia.  She is being admitted to the hospital for further evaluation and treatment.  Prerenal causes also especially dehydration and orthostasis likely responsible..  ED Course: Temperature is 99.2 blood pressure 169/91, pulse is 84 respiratory 28 oxygen sat 94% on room air.  White count 2.1 hemoglobin 10.3 with platelets 289.  Sodium 140 potassium 2.4 chloride 93 CO2 33 BUN 17 creatinine 1.02 and calcium 8.0.  Troponin 10 and 9.   Lactic acid 1.6.  Acute viral screen negative for COVID-19 and influenza.  Chest x-ray showed no acute findings.  Patient being admitted to the hospital for evaluation of course and treatment for hypokalemia  Review of Systems: As per HPI otherwise 10 point review of systems negative.    Past Medical History:  Diagnosis Date   Allergy    Anemia    Anxiety    Arthritis    CHF (congestive heart failure) (HCC)    Depression    GERD (gastroesophageal reflux disease)    Hypertension    MVP (mitral valve prolapse)     Past Surgical History:  Procedure Laterality Date   ABDOMINAL HYSTERECTOMY     APPENDECTOMY     BALLOON DILATION N/A 04/21/2020   Procedure: BALLOON DILATION;  Surgeon: Milus Banister, MD;  Location: [facility];  Service: Endoscopy;  Laterality: N/A;  pyloric/ GI   BALLOON DILATION N/A 07/04/2020   Procedure: BALLOON DILATION;  Surgeon: Mauri Pole, MD;  Location: [facility];  Service: Endoscopy;  Laterality: N/A;   BIOPSY  04/21/2020   Procedure: BIOPSY;  Surgeon: Milus Banister, MD;  Location: [facility];  Service: Endoscopy;;   BIOPSY  07/04/2020   Procedure: BIOPSY;  Surgeon: Mauri Pole, MD;  Location: [facility];  Service: Endoscopy;;  EGD and COLON   BREAST SURGERY Right 1988   tumor removal   CHOLECYSTECTOMY N/A 05/18/2020   Procedure: LAPAROSCOPIC CHOLECYSTECTOMY;  Surgeon: Johnathan Hausen, MD;  Location: [facility];  Service: General;  Laterality: N/A;  COLONOSCOPY WITH PROPOFOL N/A 07/04/2020   Procedure: COLONOSCOPY WITH PROPOFOL;  Surgeon: Mauri Pole, MD;  Location: [facility];  Service: Endoscopy;  Laterality: N/A;   ESOPHAGEAL DILATION  08/11/2020   Procedure: PYLORIC DILATION;  Surgeon: Doran Stabler, MD;  Location: [facility];  Service: Gastroenterology;;   ESOPHAGOGASTRODUODENOSCOPY (EGD) WITH PROPOFOL N/A 04/21/2020   Procedure: ESOPHAGOGASTRODUODENOSCOPY (EGD) WITH PROPOFOL;  Surgeon: Milus Banister, MD;  [facility]: [facility];  Service: Endoscopy;  Laterality: N/A;   ESOPHAGOGASTRODUODENOSCOPY (EGD) WITH PROPOFOL N/A 07/04/2020   Procedure: ESOPHAGOGASTRODUODENOSCOPY (EGD) WITH PROPOFOL;  Surgeon: Mauri Pole, MD;  [facility];  Service: Endoscopy;  Laterality: N/A;   ESOPHAGOGASTRODUODENOSCOPY (EGD) WITH PROPOFOL N/A 08/11/2020   Procedure: ESOPHAGOGASTRODUODENOSCOPY (EGD) WITH PROPOFOL;  Surgeon: Doran Stabler, MD;  [facility]: [facility];  Service: Gastroenterology;  Laterality: N/A;   KNEE SURGERY  2005   2 times left   KNEE SURGERY Right    KYPHOPLASTY N/A 07/20/2021   Procedure: T 10KYPHOPLASTY;  Surgeon: Hessie Knows, MD;  Location: [facility] OR[facility];  Service: Orthopedics;  Laterality: N/A;   KYPHOPLASTY N/A 08/23/2021   Procedure: T9 KYPHOPLASTY;  Surgeon: Hessie Knows, MD;  Location: [facility] OR[facility];  Service: Orthopedics;  Laterality: N/A;   POLYPECTOMY  07/04/2020   Procedure: POLYPECTOMY;  Surgeon: Mauri Pole, MD;  [facility];  Service: Endoscopy;;   TUBAL LIGATION       reports that she quit smoking about 4 years ago. Her smoking use included cigarettes. She has a 15.00 pack-year smoking history. She has never used smokeless tobacco. She reports that she does not currently use alcohol. She reports that she does not use drugs.  Allergies  Allergen Reactions   Meperidine Hives and Anaphylaxis   Strawberry Extract Hives and Swelling   Carafate [Sucralfate] Nausea Only and Other (See Comments)    Severe nausea   Wasp Venom Hives    Family History  Problem Relation Age of Onset   Breast cancer Mother 46   Ovarian cancer Mother 84   Brain cancer Mother    Hypertension Father    Hyperlipidemia Father    Heart disease Father    Colon cancer Father        diagnosed in his 57s   Breast cancer Maternal Grandmother 94   Diabetes Paternal Grandmother    Breast cancer Paternal Grandmother 29   Other Son        drug over dose   Colon cancer Maternal Grandfather         dx in his 46s   Liver disease Maternal Grandfather    Breast cancer Maternal Aunt 60   Breast cancer Maternal Aunt 60   Colon cancer Maternal Uncle    Stomach cancer Neg Hx    Pancreatic cancer Neg Hx    Esophageal cancer Neg Hx      Prior to Admission medications   Medication Sig Start Date End Date Taking? Authorizing Provider  albuterol (VENTOLIN HFA) 108 (90 Base) MCG/ACT inhaler Inhale 1-2 puffs into the lungs every 6 (six) hours as needed for wheezing or shortness of breath. 10/31/20   Tasia Catchings, Amy V, PA-C  alendronate (FOSAMAX) 70 MG tablet Take 1 tablet (70 mg total) by mouth every 7 (seven) days. Take with a full glass of water on an empty stomach. 08/28/21   [name], MD  amoxicillin-clavulanate (AUGMENTIN) 875-125 MG tablet Take 1 tablet by mouth every 12 (twelve) hours. 09/20/21   Jaynee Eagles, PA-C  aspirin 81 MG EC tablet Take 1 tablet (81 mg total) by mouth daily. Swallow whole. 09/27/21   [name], MD  aspirin-acetaminophen-caffeine (EXCEDRIN MIGRAINE) [phone] MG tablet Take 1-2 tablets by mouth every 6 (six) hours as needed for headache. Patient not taking: Reported on 09/20/2021    [provider]  atorvastatin (LIPITOR) 40 MG tablet Take 1 tablet (40 mg total) by mouth daily. 09/27/21   [name], MD  carvedilol (COREG) 6.25 MG tablet Take 1 tablet (6.25 mg total) by mouth 2 (two) times daily with a meal. 09/27/21   [name], MD  cetirizine (ZYRTEC ALLERGY) 10 MG tablet Take 1 tablet (10 mg total) by mouth daily. 09/12/21   Jaynee Eagles, PA-C  FLUoxetine (PROZAC) 40 MG capsule Take 40 mg by mouth daily.    [provider]  furosemide (LASIX) 40 MG tablet Take 1 tablet (40 mg total) by mouth daily as needed for fluid or edema. Patient taking differently: Take 40 mg by mouth 2 (two) times daily. 08/22/20   Mercy Riding, MD  gabapentin (NEURONTIN) 400 MG capsule Take 400 mg by mouth 3 (three) times daily as needed (for nerve pain).  10/20/18   [provider]  hydrOXYzine (ATARAX/VISTARIL) 25 MG tablet Take 25 mg by mouth every 8 (eight) hours as needed for anxiety. 08/22/21   [provider]  ibuprofen (ADVIL) 600 MG tablet Take 1 tablet (600 mg total) by mouth every 6 (six) hours as needed. Patient not taking: Reported on 09/20/2021 06/12/21   Melynda Ripple, MD  mirtazapine (REMERON) 15 MG tablet Take 15 mg by mouth at bedtime. 10/20/20   [provider]  omeprazole (PRILOSEC) 40 MG capsule TAKE 1 CAPSULE BY MOUTH EVERY DAY Patient taking differently: Take 80 mg by mouth daily. 08/14/21   [name], MD  ondansetron (ZOFRAN-ODT) 4 MG disintegrating tablet Take 1-2 tablets by mouth every 6 hours as needed 03/03/21   Levin Erp, PA  OXYGEN Inhale 2 L/min into the lungs See admin instructions. 2 L/min at bedtime and as needed for shortness of breath throughout the day    [provider]  potassium chloride SA (KLOR-CON) 20 MEQ tablet Take 1 tablet (20 mEq total) by mouth daily. Patient taking differently: Take 20 mEq by mouth daily. With furosemide 08/28/21   [name], MD  predniSONE (DELTASONE) 20 MG tablet Take 2 tablets daily with breakfast. 09/20/21   Jaynee Eagles, PA-C  valsartan (DIOVAN) 320 MG tablet Take 320 mg by mouth daily. 05/11/21   [provider]    Physical Exam: Vitals:   10/05/21 1600 10/05/21 1730 10/05/21 1830 10/05/21 1917  BP: 139/68 (!) 168/105 (!) 162/87 (!) 164/90  Pulse: 61 77 78 77  Resp: (!) 28 20 16 18   Temp:    97.9 F (36.6 C)  TempSrc:    Oral  SpO2: 94% 96% 98% 98%      Constitutional: Stable, anxious, no distress Vitals:   10/05/21 1600 10/05/21 1730 10/05/21 1830 10/05/21 1917  BP: 139/68 (!) 168/105 (!) 162/87 (!) 164/90  Pulse: 61 77 78 77  Resp: (!) 28 20 16 18   Temp:    97.9 F (36.6 C)  TempSrc:    Oral  SpO2: 94% 96% 98% 98%   Eyes: PERRL, lids and conjunctivae normal ENMT: Mucous membranes are dry.  Posterior pharynx clear of any exudate or lesions.Normal dentition.  Neck: normal, supple, no masses, no thyromegaly Respiratory: clear to  auscultation bilaterally, no wheezing, no crackles. Normal respiratory effort. No accessory muscle use.  Cardiovascular: Regular rate and rhythm, no murmurs / rubs / gallops. No extremity edema. 2+ pedal pulses. No carotid bruits.  Abdomen: no tenderness, no masses palpated. No hepatosplenomegaly. Bowel sounds positive.  Musculoskeletal: no clubbing / cyanosis. No joint deformity upper and lower extremities. Good ROM, no contractures. Normal muscle tone.  Skin: no rashes, lesions, ulcers. No induration Neurologic: CN 2-12 grossly intact. Sensation intact, DTR normal. Strength 5/5 in all 4.  Psychiatric: Normal judgment and insight. Alert and oriented x 3.  Anxious mood.     Labs on Admission: I have personally reviewed following labs and imaging studies  CBC: Recent Labs  Lab 10/05/21 1609  WBC 10.1  NEUTROABS 6.7  HGB 10.3*  HCT 33.1*  MCV 80.3  PLT 174   Basic Metabolic Panel: Recent Labs  Lab 10/05/21 1609  NA 140  K 2.4*  CL 93*  CO2 33*  GLUCOSE 111*  BUN 17  CREATININE 1.02*  CALCIUM 8.0*   GFR: CrCl cannot be calculated (Unknown ideal weight.). Liver Function Tests: Recent Labs  Lab 10/05/21 1609  AST 23  ALT 14  ALKPHOS 109  BILITOT 0.8  PROT 6.6  ALBUMIN 3.0*   Recent Labs  Lab 10/05/21 1609  LIPASE 29   No results for input(s): AMMONIA in the last 168 hours. Coagulation Profile: No results for input(s): INR, PROTIME in the last 168 hours. Cardiac Enzymes: No results for input(s): CKTOTAL, CKMB, CKMBINDEX, TROPONINI in the last 168 hours. BNP (last 3 results) Recent Labs    08/29/21 0931  PROBNP 180.0*   HbA1C: No results for input(s): HGBA1C in the last 72 hours. CBG: No results for input(s): GLUCAP in the last 168 hours. Lipid Profile: No results for input(s): CHOL, HDL, LDLCALC, TRIG, CHOLHDL,  LDLDIRECT in the last 72 hours. Thyroid Function Tests: No results for input(s): TSH, T4TOTAL, FREET4, T3FREE, THYROIDAB in the last 72 hours. Anemia Panel: No results for input(s): VITAMINB12, FOLATE, FERRITIN, TIBC, IRON, RETICCTPCT in the last 72 hours. Urine analysis:    Component Value Date/Time   COLORURINE YELLOW 02/05/2021 0749   APPEARANCEUR HAZY (A) 02/05/2021 0749   APPEARANCEUR Cloudy 09/18/2012 2000   LABSPEC 1.010 02/05/2021 0749   LABSPEC 1.012 09/18/2012 2000   PHURINE 5.0 02/05/2021 0749   GLUCOSEU NEGATIVE 02/05/2021 0749   GLUCOSEU Negative 09/18/2012 2000   HGBUR SMALL (A) 02/05/2021 0749   BILIRUBINUR NEGATIVE 02/05/2021 0749   BILIRUBINUR negative 04/08/2019 1103   BILIRUBINUR Negative 09/18/2012 2000   KETONESUR NEGATIVE 02/05/2021 0749   PROTEINUR NEGATIVE 02/05/2021 0749   UROBILINOGEN 0.2 04/08/2019 1103   UROBILINOGEN 0.2 05/25/2017 1743   NITRITE NEGATIVE 02/05/2021 0749   LEUKOCYTESUR LARGE (A) 02/05/2021 0749   LEUKOCYTESUR Trace 09/18/2012 2000   Sepsis Labs: @LABRCNTIP (procalcitonin:4,lacticidven:4) ) Recent Results (from the past 240 hour(s))  Resp Panel by RT-PCR (Flu A&B, Covid) Nasopharyngeal Swab     Status: None   Collection Time: 10/05/21  5:02 PM   Specimen: Nasopharyngeal Swab; Nasopharyngeal(NP) swabs in vial transport medium  Result Value Ref Range Status   SARS Coronavirus 2 by RT PCR NEGATIVE NEGATIVE Final    Comment: (NOTE) SARS-CoV-2 target nucleic acids are NOT DETECTED.  The SARS-CoV-2 RNA is generally detectable in upper respiratory specimens during the acute phase of infection. The lowest concentration of SARS-CoV-2 viral copies this assay can detect is 138 copies/mL. A negative result does not preclude SARS-Cov-2 infection and should  not be used as the sole basis for treatment or other patient management decisions. A negative result may occur with  improper specimen collection/handling, submission of specimen  other than nasopharyngeal swab, presence of viral mutation(s) within the areas targeted by this assay, and inadequate number of viral copies(<138 copies/mL). A negative result must be combined with clinical observations, patient history, and epidemiological information. The expected result is Negative.  Fact Sheet for Patients:  EntrepreneurPulse.com.au  Fact Sheet for Healthcare Providers:  IncredibleEmployment.be  This test is no t yet approved or cleared by the [place] FDA and  has been authorized for detection and/or diagnosis of SARS-CoV-2 by FDA under an Emergency Use Authorization (EUA). This EUA will remain  in effect (meaning this test can be used) for the duration of the COVID-19 declaration under Section 564(b)(1) of the Act, 21 U.S.C.section 360bbb-3(b)(1), unless the authorization is terminated  or revoked sooner.       Influenza A by PCR NEGATIVE NEGATIVE Final   Influenza B by PCR NEGATIVE NEGATIVE Final    Comment: (NOTE) The Xpert Xpress SARS-CoV-2/FLU/RSV plus assay is intended as an aid in the diagnosis of influenza from Nasopharyngeal swab specimens and should not be used as a sole basis for treatment. Nasal washings and aspirates are unacceptable for Xpert Xpress SARS-CoV-2/FLU/RSV testing.  Fact Sheet for Patients: EntrepreneurPulse.com.au  Fact Sheet for Healthcare Providers: IncredibleEmployment.be  This test is not yet approved or cleared by the [place] FDA and has been authorized for detection and/or diagnosis of SARS-CoV-2 by FDA under an Emergency Use Authorization (EUA). This EUA will remain in effect (meaning this test can be used) for the duration of the COVID-19 declaration under Section 564(b)(1) of the Act, 21 U.S.C. section 360bbb-3(b)(1), unless the authorization is terminated or revoked.  Performed at [facility], [address]  [place], [place] [address]      Radiological Exams on Admission: DG Chest [facility] 1 View  Result Date: 10/05/2021 CLINICAL DATA:  Shortness of breath. Additional history provided: Patient reports left-sided chest pain and vomiting today. History of hypertension and CHF. EXAM: PORTABLE CHEST 1 VIEW COMPARISON:  Prior chest radiographs 09/20/2021 and earlier. Esophagram 09/18/2021. FINDINGS: Heart size within normal limits. Unchanged chronic prominence of the interstitial lung markings. Mild ill-defined opacity at the lateral left lung base, likely reflecting atelectasis. No evidence of pleural effusion or pneumothorax. No acute bony abnormality identified. Redemonstrated sequela of prior thoracic vertebral augmentation. IMPRESSION: Mild ill-defined opacity at the lateral left lung base, likely reflecting atelectasis. Otherwise, no evidence of acute cardiopulmonary abnormality. Electronically Signed   By: Kellie Simmering D.O.   On: 10/05/2021 16:45    EKG: Independently reviewed.  Shows sinus rhythm with multiform ventricular tachycardia.  Assessment/Plan Principal Problem:   Hypokalemia Active Problems:   Essential hypertension   Anxiety   HLD (hyperlipidemia)   GERD (gastroesophageal reflux disease)   CKD (chronic kidney disease) stage 3, GFR 30-59 ml/min (HCC)   Chronic diastolic CHF (congestive heart failure) (HCC)   COPD (chronic obstructive pulmonary disease) (HCC)     #1 severe hypokalemia: Most likely chronic loss due to diuretics.  Patient also had some nausea vomiting apparently prior to coming in.  This may have contributed.  Patient will be admitted.  Aggressive replacement with IV.  Follow potassium level.  Check magnesium level.  Check thyroid level.  #2 diastolic dysfunction CHF: Patient is currently on diuretics although not taking them with her potassium.  The last echocardiogram in the system  was 2019.  We will order a new echo.  Continue diuretics and supportive care.  Continue  potassium supplementation.  #3 chronic kidney disease stage IIIa: At baseline.  Continue monitoring  #4 essential hypertension: Blood pressure not fully controlled.  Will resume valsartan, Coreg and diuretics.  #5 hyperlipidemia: Continue with Lipitor.  #6 GERD: Continue PPIs  #7 anxiety disorder: Supposedly on Prozac.  Will likely continue with that once confirmed.  #8 COPD: No acute exacerbation.  Continue monitoring.  Continue breathing treatments  #9 syncopal episode: Suspected orthostasis with dehydration.  Patient however appears to have third spacing.  Gentle fluids.  Limited only.  #10 normocytic anemia: Chronic.  Being followed by hematology.  Continue outpatient work-up.   DVT prophylaxis: Lovenox Code Status: Full code Family Communication: No family at bedside Disposition Plan: Home Consults called: None Admission status: Inpatient  Severity of Illness: The appropriate patient status for this patient is INPATIENT. Inpatient status is judged to be reasonable and necessary in order to provide the required intensity of service to ensure the patient's safety. The patient's presenting symptoms, physical exam findings, and initial radiographic and laboratory data in the context of their chronic comorbidities is felt to place them at high risk for further clinical deterioration. Furthermore, it is not anticipated that the patient will be medically stable for discharge from the hospital within 2 midnights of admission.   * I certify that at the point of admission it is my clinical judgment that the patient will require inpatient hospital care spanning beyond 2 midnights from the point of admission due to high intensity of service, high risk for further deterioration and high frequency of surveillance required.Barbette Merino MD [facility] Pager [phone]  If 7PM-7AM, please contact night-coverage [email or web address] Password [facility]  10/05/2021, 7:39 PM

## 2021-10-05 NOTE — Telephone Encounter (Signed)
Patient walked into clinic with complaint of Chest pain , shortness of breath , dizziness , stated she felt weak like she was going to faint, advised wheelchair routed patient to room , Vitals BP 187/114 pulse 64-89, pain rated at 8 on scale 1 -10 center of chest radiating to back and down left arm.  Started Emesis X 2 , attained order from PCP to 0.4 mg nitro and order from MD pod B for 325 mg aspirin  given at 1530 and  2 liter 02. EMS arrived gave report off.

## 2021-10-05 NOTE — Progress Notes (Signed)
Pt states over the last few days (with today being the worse.) Pt has been "dizzy and can not catch my breathe" and "sick to my stomach". Pt states these are new symptoms. B/P 161/91, HR 84, O2 sats 99. Dr. Tasia Catchings made aware. Per Dr. Tasia Catchings hold Venofer and pt to contact PCP, or report to ER. Pt verbalizes understanding, and stable at discharge.

## 2021-10-05 NOTE — ED Triage Notes (Signed)
Pt comes via EMs with c/o left sided CP that started today. Pt states 6/10 pain. Pt states vomiting.  EMS gave 4 zofran, 324 aspirin and .8 nitro.  Pt kept going into bigemany and having syncopal episodes.  MD at bedside

## 2021-10-05 NOTE — ED Provider Notes (Signed)
[facility] Emergency Department Provider Note   ____________________________________________   Event Date/Time   First MD Initiated Contact with Patient 10/05/21 [phone]     (approximate)  I have reviewed the triage vital signs and the nursing notes.   HISTORY  Chief Complaint Chest Pain    HPI Morgan [name] is a 70 y.o. female who presents via EMS with complaints of chest pain and nausea/vomiting  LOCATION: Left chest DURATION: 3 hours prior to arrival TIMING: Stable since onset SEVERITY: 6/10 QUALITY: Pressure, burning CONTEXT: Patient was in a clinic visit when she began having acute left chest pain with associated nausea/vomiting as well as EKG that showed intermittent bigeminy.  EMS also state that patient has episodes of of losing consciousness however she is somewhat responsive during them and will shake or nod her head.  Patient will fully wake up to sternal rub when these episodes occurred.  Patient received 324 mg aspirin, 0.8 mcg of nitroglycerin and 4 mg of Zofran MODIFYING FACTORS: States that vomiting worsens this pain and is partially relieved when not vomiting ASSOCIATED SYMPTOMS: Nausea/vomiting   Per medical record review, patient has history of of MI with CHF and patient endorses previous abdominal surgery of hysterectomy and appendectomy          Past Medical History:  Diagnosis Date   Allergy    Anemia    Anxiety    Arthritis    CHF (congestive heart failure) (HCC)    Depression    GERD (gastroesophageal reflux disease)    Hypertension    MVP (mitral valve prolapse)     Patient Active Problem List   Diagnosis Date Noted   Microcytic anemia 09/11/2021   Stage 3a chronic kidney disease ([place]) 09/11/2021   IDA (iron deficiency anemia) 09/11/2021   Elevated TSH 08/28/2021   Osteopenia 08/28/2021   DOE (dyspnea on exertion) 08/28/2021   Acute midline thoracic back pain 06/26/2021   Other chronic pain 06/26/2021    Chest pressure 02/20/2021   Left sided abdominal pain 02/01/2021   Chronic diastolic CHF (congestive heart failure) ([place]) 08/09/2020   COPD (chronic obstructive pulmonary disease) ([place]) 08/09/2020   Chronic respiratory failure with hypoxia ([place]) 08/09/2020   Polyp of sigmoid colon    Polyp of cecum    Gastritis and gastroduodenitis    Pylorus ulcer    Pyloric stenosis in adult    Chronic cholecystitis 05/19/2020   Oxygen dependent 03/30/2020   Nausea and vomiting in adult 03/22/2020   Peptic ulcer disease 03/22/2020   Iron deficiency anemia 03/22/2020   Acute pain of right knee 12/29/2019   Acute left-sided low back pain without sciatica 12/29/2019   Fall 04/08/2019   Chronic abdominal pain 02/19/2019   Asymptomatic bacteriuria 02/19/2019   CKD (chronic kidney disease) stage 3, GFR 30-59 ml/min ([place]) 01/13/2019   CHF (congestive heart failure) ([place]) 01/13/2019   Dizziness 01/13/2019   Hoarseness 11/12/2018   Dysphagia 11/12/2018   Sleepiness 11/12/2018   CVA (cerebral vascular accident) ([place]) 08/22/2018   Left-sided weakness 10/04/2017   HLD (hyperlipidemia) 10/04/2017   GERD (gastroesophageal reflux disease) 10/04/2017   Anxiety 08/26/2017   Essential hypertension    Depression    Diarrhea 02/22/2015   Hypokalemia 02/22/2015   Chronic lower back pain 04/29/2013   Paresthesia 04/29/2013    Past Surgical History:  Procedure Laterality Date   ABDOMINAL HYSTERECTOMY     APPENDECTOMY     BALLOON DILATION N/A 04/21/2020   Procedure: BALLOON DILATION;  Surgeon: Milus Banister, MD;  Location: [facility];  Service: Endoscopy;  Laterality: N/A;  pyloric/ GI   BALLOON DILATION N/A 07/04/2020   Procedure: BALLOON DILATION;  Surgeon: Mauri Pole, MD;  Location: [facility];  Service: Endoscopy;  Laterality: N/A;   BIOPSY  04/21/2020   Procedure: BIOPSY;  Surgeon: Milus Banister, MD;  Location: [facility];  Service: Endoscopy;;   BIOPSY  07/04/2020   Procedure: BIOPSY;   Surgeon: Mauri Pole, MD;  Location: [facility];  Service: Endoscopy;;  EGD and COLON   BREAST SURGERY Right 1988   tumor removal   CHOLECYSTECTOMY N/A 05/18/2020   Procedure: LAPAROSCOPIC CHOLECYSTECTOMY;  Surgeon: Johnathan Hausen, MD;  Location: [facility];  Service: General;  Laterality: N/A;   COLONOSCOPY WITH PROPOFOL N/A 07/04/2020   Procedure: COLONOSCOPY WITH PROPOFOL;  Surgeon: Mauri Pole, MD;  Location: [facility];  Service: Endoscopy;  Laterality: N/A;   ESOPHAGEAL DILATION  08/11/2020   Procedure: PYLORIC DILATION;  Surgeon: Doran Stabler, MD;  Location: [facility];  Service: Gastroenterology;;   ESOPHAGOGASTRODUODENOSCOPY (EGD) WITH PROPOFOL N/A 04/21/2020   Procedure: ESOPHAGOGASTRODUODENOSCOPY (EGD) WITH PROPOFOL;  Surgeon: Milus Banister, MD;  Location: [facility];  Service: Endoscopy;  Laterality: N/A;   ESOPHAGOGASTRODUODENOSCOPY (EGD) WITH PROPOFOL N/A 07/04/2020   Procedure: ESOPHAGOGASTRODUODENOSCOPY (EGD) WITH PROPOFOL;  Surgeon: Mauri Pole, MD;  Location: [facility];  Service: Endoscopy;  Laterality: N/A;   ESOPHAGOGASTRODUODENOSCOPY (EGD) WITH PROPOFOL N/A 08/11/2020   Procedure: ESOPHAGOGASTRODUODENOSCOPY (EGD) WITH PROPOFOL;  Surgeon: Doran Stabler, MD;  Location: [facility];  Service: Gastroenterology;  Laterality: N/A;   KNEE SURGERY  2005   2 times left   KNEE SURGERY Right    KYPHOPLASTY N/A 07/20/2021   Procedure: T 10KYPHOPLASTY;  Surgeon: Hessie Knows, MD;  Location: [facility];  Service: Orthopedics;  Laterality: N/A;   KYPHOPLASTY N/A 08/23/2021   Procedure: T9 KYPHOPLASTY;  Surgeon: Hessie Knows, MD;  Location: [facility];  Service: Orthopedics;  Laterality: N/A;   POLYPECTOMY  07/04/2020   Procedure: POLYPECTOMY;  Surgeon: Mauri Pole, MD;  Location: [facility];  Service: Endoscopy;;   TUBAL LIGATION      Prior to Admission medications   Medication Sig Start Date End Date Taking? Authorizing Provider   albuterol (VENTOLIN HFA) 108 (90 Base) MCG/ACT inhaler Inhale 1-2 puffs into the lungs every 6 (six) hours as needed for wheezing or shortness of breath. 10/31/20   Tasia Catchings, Amy V, PA-C  alendronate (FOSAMAX) 70 MG tablet Take 1 tablet (70 mg total) by mouth every 7 (seven) days. Take with a full glass of water on an empty stomach. 08/28/21   [name], MD  amoxicillin-clavulanate (AUGMENTIN) 875-125 MG tablet Take 1 tablet by mouth every 12 (twelve) hours. 09/20/21   Jaynee Eagles, PA-C  aspirin 81 MG EC tablet Take 1 tablet (81 mg total) by mouth daily. Swallow whole. 09/27/21   [name], MD  aspirin-acetaminophen-caffeine (EXCEDRIN MIGRAINE) [phone] MG tablet Take 1-2 tablets by mouth every 6 (six) hours as needed for headache. Patient not taking: Reported on 09/20/2021    [provider]  atorvastatin (LIPITOR) 40 MG tablet Take 1 tablet (40 mg total) by mouth daily. 09/27/21   [name], MD  carvedilol (COREG) 6.25 MG tablet Take 1 tablet (6.25 mg total) by mouth 2 (two) times daily with a meal. 09/27/21   [name], MD  cetirizine (ZYRTEC ALLERGY) 10 MG tablet Take 1 tablet (10 mg  total) by mouth daily. 09/12/21   Jaynee Eagles, PA-C  FLUoxetine (PROZAC) 40 MG capsule Take 40 mg by mouth daily.    [provider]  furosemide (LASIX) 40 MG tablet Take 1 tablet (40 mg total) by mouth daily as needed for fluid or edema. Patient taking differently: Take 40 mg by mouth 2 (two) times daily. 08/22/20   Mercy Riding, MD  gabapentin (NEURONTIN) 400 MG capsule Take 400 mg by mouth 3 (three) times daily as needed (for nerve pain). 10/20/18   [provider]  hydrOXYzine (ATARAX/VISTARIL) 25 MG tablet Take 25 mg by mouth every 8 (eight) hours as needed for anxiety. 08/22/21   [provider]  ibuprofen (ADVIL) 600 MG tablet Take 1 tablet (600 mg total) by mouth every 6 (six) hours as needed. Patient not taking: Reported on 09/20/2021 06/12/21   Melynda Ripple, MD  mirtazapine (REMERON) 15 MG tablet Take 15 mg by mouth at bedtime. 10/20/20   [provider]  omeprazole (PRILOSEC) 40 MG capsule TAKE 1 CAPSULE BY MOUTH EVERY DAY Patient taking differently: Take 80 mg by mouth daily. 08/14/21   [name], MD  ondansetron (ZOFRAN-ODT) 4 MG disintegrating tablet Take 1-2 tablets by mouth every 6 hours as needed 03/03/21   Levin Erp, PA  OXYGEN Inhale 2 L/min into the lungs See admin instructions. 2 L/min at bedtime and as needed for shortness of breath throughout the day    [provider]  potassium chloride SA (KLOR-CON) 20 MEQ tablet Take 1 tablet (20 mEq total) by mouth daily. Patient taking differently: Take 20 mEq by mouth daily. With furosemide 08/28/21   [name], MD  predniSONE (DELTASONE) 20 MG tablet Take 2 tablets daily with breakfast. 09/20/21   Jaynee Eagles, PA-C  valsartan (DIOVAN) 320 MG tablet Take 320 mg by mouth daily. 05/11/21   [provider]    Allergies Meperidine, Strawberry extract, Carafate [sucralfate], and Wasp venom  Family History  Problem Relation Age of Onset   Breast cancer Mother 30   Ovarian cancer Mother 37   Brain cancer Mother    Hypertension Father    Hyperlipidemia Father    Heart disease Father    Colon cancer Father        diagnosed in his 43s   Breast cancer Maternal Grandmother 54   Diabetes Paternal Grandmother    Breast cancer Paternal Grandmother 48   Other Son        drug over dose   Colon cancer Maternal Grandfather        dx in his 75s   Liver disease Maternal Grandfather    Breast cancer Maternal Aunt 60   Breast cancer Maternal Aunt 60   Colon cancer Maternal Uncle    Stomach cancer Neg Hx    Pancreatic cancer Neg Hx    Esophageal cancer Neg Hx     Social History Social History   Tobacco Use   Smoking status: Former    Packs/day: 1.00    Years: 15.00    Pack years: 15.00    Types: Cigarettes    Quit date: 09/12/2017     Years since quitting: 4.0   Smokeless tobacco: Never  Vaping Use   Vaping Use: Never used  Substance Use Topics   Alcohol use: Not Currently   Drug use: No    Review of Systems Constitutional: No fever/chills Eyes: No visual changes. ENT: No sore throat. Cardiovascular: Endorses chest pain. Respiratory: Denies  shortness of breath. Gastrointestinal: No abdominal pain.  Endorses nausea and vomiting.  No diarrhea. Genitourinary: Negative for dysuria. Musculoskeletal: Negative for acute arthralgias Skin: Negative for rash. Neurological: Negative for headaches, weakness/numbness/paresthesias in any extremity Psychiatric: Negative for suicidal ideation/homicidal ideation   ____________________________________________   PHYSICAL EXAM:  VITAL SIGNS: ED Triage Vitals  Enc Vitals Group     BP 10/05/21 1558 (!) 151/92     Pulse Rate 10/05/21 1558 78     Resp 10/05/21 1558 20     Temp 10/05/21 1558 97.9 F (36.6 C)     Temp src --      SpO2 10/05/21 1558 94 %     Weight --      Height --      Head Circumference --      Peak Flow --      Pain Score 10/05/21 1559 6     Pain Loc --      Pain Edu? --      Excl. in [place]? --    Constitutional: Alert and oriented. Well appearing and in no acute distress. Eyes: Conjunctivae are normal. PERRL. Head: Atraumatic. Nose: No congestion/rhinnorhea. Mouth/Throat: Mucous membranes are moist. Neck: No stridor Cardiovascular: Grossly normal heart sounds.  Good peripheral circulation. Respiratory: Rales over bilateral lung bases.  Decreased respiratory effort.  No retractions. Gastrointestinal: Soft and nontender. No distention. Musculoskeletal: No obvious deformities Neurologic:  Normal speech and language. No gross focal neurologic deficits are appreciated. Skin:  Skin is warm and dry. No rash noted. Psychiatric: Mood and affect are normal. Speech and behavior are normal.  ____________________________________________   LABS (all labs  ordered are listed, but only abnormal results are displayed)  Labs Reviewed  COMPREHENSIVE METABOLIC PANEL - Abnormal; Notable for the following components:      Result Value   Potassium 2.4 (*)    Chloride 93 (*)    CO2 33 (*)    Glucose, Bld 111 (*)    Creatinine, Ser 1.02 (*)    Calcium 8.0 (*)    Albumin 3.0 (*)    GFR, Estimated 60 (*)    All other components within normal limits  CBC WITH DIFFERENTIAL/PLATELET - Abnormal; Notable for the following components:   Hemoglobin 10.3 (*)    HCT 33.1 (*)    MCH 25.0 (*)    RDW 18.3 (*)    All other components within normal limits  RESP PANEL BY RT-PCR (FLU A&B, COVID) ARPGX2  LIPASE, BLOOD  LACTIC ACID, PLASMA  LACTIC ACID, PLASMA  COMPREHENSIVE METABOLIC PANEL  CBC  HIV ANTIBODY (ROUTINE TESTING W REFLEX)  TROPONIN I (HIGH SENSITIVITY)  TROPONIN I (HIGH SENSITIVITY)   ____________________________________________  EKG  ED ECG REPORT I, Naaman Plummer, the attending physician, personally viewed and interpreted this ECG.  Date: 10/05/2021 EKG Time: 1554 Rate: 63 Rhythm: normal sinus rhythm with occasional rhythms of ventricular bigeminy QRS Axis: normal Intervals: normal ST/T Wave abnormalities: normal Narrative Interpretation: Initially normal sinus rhythm with no evidence of acute ischemia and later ventricular bigeminy  ____________________________________________  RADIOLOGY  ED MD interpretation: Single view portable chest x-ray shows a mild ill-defined opacity at the left lateral lung base  Official radiology report(s): DG Chest Port 1 View  Result Date: 10/05/2021 CLINICAL DATA:  Shortness of breath. Additional history provided: Patient reports left-sided chest pain and vomiting today. History of hypertension and CHF. EXAM: PORTABLE CHEST 1 VIEW COMPARISON:  Prior chest radiographs 09/20/2021 and earlier. Esophagram 09/18/2021. FINDINGS: Heart size within  normal limits. Unchanged chronic prominence of the  interstitial lung markings. Mild ill-defined opacity at the lateral left lung base, likely reflecting atelectasis. No evidence of pleural effusion or pneumothorax. No acute bony abnormality identified. Redemonstrated sequela of prior thoracic vertebral augmentation. IMPRESSION: Mild ill-defined opacity at the lateral left lung base, likely reflecting atelectasis. Otherwise, no evidence of acute cardiopulmonary abnormality. Electronically Signed   By: Kellie Simmering D.O.   On: 10/05/2021 16:45    ____________________________________________   PROCEDURES  Procedure(s) performed (including Critical Care):  .1-3 Lead EKG Interpretation Performed by: Naaman Plummer, MD Authorized by: Naaman Plummer, MD     Interpretation: normal     ECG rate:  74   ECG rate assessment: normal     Rhythm: sinus rhythm     Ectopy: none     Conduction: normal     ____________________________________________   INITIAL IMPRESSION / ASSESSMENT AND PLAN / ED COURSE  As part of my medical decision making, I reviewed the following data within the electronic medical record, if available:  Nursing notes reviewed and incorporated, Labs reviewed, EKG interpreted, Old chart reviewed, Radiograph reviewed and Notes from prior ED visits reviewed and incorporated        Patient is a 70 year old female who presents for nausea/vomiting/chest pain with the above past medical history.  Differential diagnosis includes but is not limited to: ACS, volume overload, appendicitis, small bowel obstruction, CHF exacerbation  Laboratory evaluation significant for potassium of 2.4 Patient repleted IV until nausea vomiting under control and then switch to p.o. supplementation  Given patient's history of heart failure, she will require admission for observation due to significant hypokalemia as well as intermittent bigeminy and frequent syncopal episodes  I spoke to Dr. Jonelle Sidle in internal medicine who agrees to accept this patient onto  his service for further evaluation and management  Dispo: Admit to medicine      ____________________________________________   FINAL CLINICAL IMPRESSION(S) / ED DIAGNOSES  Final diagnoses:  Hypokalemia  Syncope, unspecified syncope type  Nausea and vomiting, unspecified vomiting type     ED Discharge Orders     None        Note:  This document was prepared using Dragon voice recognition software and may include unintentional dictation errors.    Naaman Plummer, MD 10/05/21 [phone]

## 2021-10-06 DIAGNOSIS — E876 Hypokalemia: Secondary | ICD-10-CM | POA: Diagnosis not present

## 2021-10-06 LAB — CBC
HCT: 28.7 % — ABNORMAL LOW (ref 36.0–46.0)
Hemoglobin: 9.2 g/dL — ABNORMAL LOW (ref 12.0–15.0)
MCH: 25.6 pg — ABNORMAL LOW (ref 26.0–34.0)
MCHC: 32.1 g/dL (ref 30.0–36.0)
MCV: 79.7 fL — ABNORMAL LOW (ref 80.0–100.0)
Platelets: 259 10*3/uL (ref 150–400)
RBC: 3.6 MIL/uL — ABNORMAL LOW (ref 3.87–5.11)
RDW: 18.3 % — ABNORMAL HIGH (ref 11.5–15.5)
WBC: 6.7 10*3/uL (ref 4.0–10.5)
nRBC: 0 % (ref 0.0–0.2)

## 2021-10-06 LAB — COMPREHENSIVE METABOLIC PANEL
ALT: 10 U/L (ref 0–44)
AST: 16 U/L (ref 15–41)
Albumin: 2.8 g/dL — ABNORMAL LOW (ref 3.5–5.0)
Alkaline Phosphatase: 98 U/L (ref 38–126)
Anion gap: 9 (ref 5–15)
BUN: 16 mg/dL (ref 8–23)
CO2: 36 mmol/L — ABNORMAL HIGH (ref 22–32)
Calcium: 7.7 mg/dL — ABNORMAL LOW (ref 8.9–10.3)
Chloride: 96 mmol/L — ABNORMAL LOW (ref 98–111)
Creatinine, Ser: 0.96 mg/dL (ref 0.44–1.00)
GFR, Estimated: >60 mL/min (ref >60)
Glucose, Bld: 97 mg/dL (ref 70–99)
Potassium: <2 mmol/L — CL (ref 3.5–5.1)
Sodium: 141 mmol/L (ref 135–145)
Total Bilirubin: 0.6 mg/dL (ref 0.3–1.2)
Total Protein: 6 g/dL — ABNORMAL LOW (ref 6.5–8.1)

## 2021-10-06 LAB — MAGNESIUM
Magnesium: 1.6 mg/dL — ABNORMAL LOW (ref 1.7–2.4)
Magnesium: 1.7 mg/dL (ref 1.7–2.4)

## 2021-10-06 LAB — HIV ANTIBODY (ROUTINE TESTING W REFLEX): HIV Screen 4th Generation wRfx: NONREACTIVE

## 2021-10-06 LAB — POTASSIUM: Potassium: 3 mmol/L — ABNORMAL LOW (ref 3.5–5.1)

## 2021-10-06 MED ORDER — ENSURE ENLIVE PO LIQD: 237.0000 mL | Freq: Three times a day (TID) | ORAL | Status: DC

## 2021-10-06 MED ORDER — ALBUTEROL SULFATE (2.5 MG/3ML) 0.083% IN NEBU: 2.5000 mg | INHALATION_SOLUTION | Freq: Four times a day (QID) | RESPIRATORY_TRACT | Status: DC | PRN

## 2021-10-06 MED ORDER — SODIUM CHLORIDE 0.9 % IV SOLN: INTRAVENOUS | Status: DC | PRN

## 2021-10-06 MED ORDER — FLUOXETINE HCL 20 MG PO CAPS
40.0000 mg | ORAL_CAPSULE | Freq: Every day | ORAL | Status: DC
  Administered 2021-10-06 – 2021-10-07 (×2): 40 mg via ORAL
  Filled 2021-10-06 (×2): qty 2

## 2021-10-06 MED ORDER — MAGNESIUM SULFATE 2 GM/50ML IV SOLN
2.0000 g | Freq: Once | INTRAVENOUS | Status: AC
  Administered 2021-10-06: 2 g via INTRAVENOUS
  Filled 2021-10-06: qty 50

## 2021-10-06 MED ORDER — OXYCODONE HCL 5 MG PO TABS
5.0000 mg | ORAL_TABLET | Freq: Four times a day (QID) | ORAL | Status: DC | PRN
  Administered 2021-10-06 – 2021-10-07 (×3): 5 mg via ORAL
  Filled 2021-10-06 (×3): qty 1

## 2021-10-06 MED ORDER — ADULT MULTIVITAMIN W/MINERALS CH
1.0000 | ORAL_TABLET | Freq: Every day | ORAL | Status: DC
  Administered 2021-10-06 – 2021-10-07 (×2): 1 via ORAL
  Filled 2021-10-06 (×2): qty 1

## 2021-10-06 MED ORDER — MELOXICAM 7.5 MG PO TABS
7.5000 mg | ORAL_TABLET | Freq: Every day | ORAL | Status: DC
  Administered 2021-10-06 – 2021-10-07 (×2): 7.5 mg via ORAL
  Filled 2021-10-06 (×2): qty 1

## 2021-10-06 MED ORDER — PANTOPRAZOLE SODIUM 40 MG PO TBEC
40.0000 mg | DELAYED_RELEASE_TABLET | Freq: Every day | ORAL | Status: DC
  Administered 2021-10-06 – 2021-10-07 (×2): 40 mg via ORAL
  Filled 2021-10-06 (×2): qty 1

## 2021-10-06 MED ORDER — POTASSIUM CHLORIDE 10 MEQ/100ML IV SOLN
10.0000 meq | INTRAVENOUS | Status: AC
Start: 2021-10-06 — End: 2021-10-06
  Administered 2021-10-06 (×3): 10 meq via INTRAVENOUS
  Filled 2021-10-06 (×3): qty 100

## 2021-10-06 MED ORDER — ACETAMINOPHEN 325 MG PO TABS
650.0000 mg | ORAL_TABLET | Freq: Four times a day (QID) | ORAL | Status: DC | PRN
  Administered 2021-10-06: 650 mg via ORAL
  Filled 2021-10-06 (×2): qty 2

## 2021-10-06 MED ORDER — POTASSIUM CHLORIDE CRYS ER 20 MEQ PO TBCR
40.0000 meq | EXTENDED_RELEASE_TABLET | ORAL | Status: AC
  Administered 2021-10-06 (×2): 40 meq via ORAL
  Filled 2021-10-06 (×2): qty 2

## 2021-10-06 MED ORDER — POTASSIUM CHLORIDE CRYS ER 20 MEQ PO TBCR
40.0000 meq | EXTENDED_RELEASE_TABLET | Freq: Once | ORAL | Status: AC
  Administered 2021-10-06: 40 meq via ORAL
  Filled 2021-10-06: qty 2

## 2021-10-06 MED ORDER — HYDRALAZINE HCL 20 MG/ML IJ SOLN: 10.0000 mg | INTRAMUSCULAR | Status: DC | PRN

## 2021-10-06 MED ORDER — POTASSIUM CHLORIDE 10 MEQ/100ML IV SOLN
10.0000 meq | INTRAVENOUS | Status: AC
  Administered 2021-10-06: 10 meq via INTRAVENOUS
  Filled 2021-10-06: qty 100

## 2021-10-06 MED ORDER — ENOXAPARIN SODIUM 60 MG/0.6ML IJ SOSY
0.5000 mg/kg | PREFILLED_SYRINGE | INTRAMUSCULAR | Status: DC
  Administered 2021-10-06: 52.5 mg via SUBCUTANEOUS
  Filled 2021-10-06: qty 0.6

## 2021-10-06 MED ORDER — LORATADINE 10 MG PO TABS
10.0000 mg | ORAL_TABLET | Freq: Every day | ORAL | Status: DC
  Administered 2021-10-06 – 2021-10-07 (×2): 10 mg via ORAL
  Filled 2021-10-06 (×2): qty 1

## 2021-10-06 NOTE — Progress Notes (Signed)
Received critical result from lab, pt's potassium less than 2. Notified NP on call.

## 2021-10-06 NOTE — Progress Notes (Signed)
PHARMACIST - PHYSICIAN COMMUNICATION  CONCERNING:  Enoxaparin (Lovenox) for DVT Prophylaxis    RECOMMENDATION: Patient was prescribed enoxaprin 40mg  q24 hours for VTE prophylaxis.   Filed Weights   10/06/21 0800  Weight: 106.6 kg (235 lb 0.2 oz)    Body mass index is 31.01 kg/m.  Estimated Creatinine Clearance: 77.8 mL/min (by C-G formula based on SCr of 0.96 mg/dL).   Based on [place] patient is candidate for enoxaparin 0.5mg /kg TBW SQ every 24 hours based on BMI being >30.  DESCRIPTION: Pharmacy has adjusted enoxaparin dose per [facility] policy.  Patient is now receiving enoxaparin 52.5 mg every 24 hours    Sherilyn Banker, PharmD Clinical Pharmacist  10/06/2021 8:09 AM

## 2021-10-06 NOTE — Progress Notes (Signed)
PROGRESS NOTE    Morgan [name]  NKN:[id number] DOB: Oct 31, 1951 DOA: 10/05/2021 PCP: [name], MD    Brief Narrative:  70 y.o. female with medical history significant of diastolic dysfunction CHF last echo in our system from September 2019, mitral valve prolapse, essential hypertension, GERD, depression, anxiety disorder: Osteoarthritis, iron deficiency anemia, chronic kidney disease stage IIIa among other things who is chronically on Lasix for her CHF and has been known to have hypokalemia as a result.  Patient apparently has not been taking her potassium because she does not want to take it.  She has not been feeling well lately having chest pain rated as 6 out of 10 which is substernal.  She was seen at the [facility] today where she was on a routine visit then started having the chest pain.  That was associated with some nausea and vomiting.  Her EKG showed intermittent bigeminy and while there patient apparently had an episode of loss of consciousness.  She was more less shaking at the time.  She was fully awake when she arrived the ER.  They did give her some sternal rub apparently.  Also aspirin and nitroglycerin.  Patient was found to have a potassium of 2.4.    For reasons that are unclear apparently patient was refusing potassium replacement on admission.  The following morning I had a lengthy discussion with her and she had no objection to aggressive IV and p.o. potassium replacement.  Potassium on hospital day #2 is less than 2.  Patient also endorses lethargy and weakness.  Magnesium also low.   Assessment & Plan:   Principal Problem:   Hypokalemia Active Problems:   Essential hypertension   Anxiety   HLD (hyperlipidemia)   GERD (gastroesophageal reflux disease)   CKD (chronic kidney disease) stage 3, GFR 30-59 ml/min (HCC)   Chronic diastolic CHF (congestive heart failure) (HCC)   COPD (chronic obstructive pulmonary disease) (HCC)  Severe  hypokalemia Hypomagnesemia More than likely from overdiuresis Also apparently some nausea and vomiting prior to coming in Plan: Aggressive IV and p.o. potassium replacement IV magnesium replacement Recheck K and mag today p.m. Continue to replace aggressively Recheck a.m. electrolytes Is stable anticipated date of discharge 37/90  Chronic diastolic congestive heart failure Patient states her home diuretics were recently increased to 40 mg Lasix twice daily Patient has clinical indications of dehydration New echo ordered on admission as last was from 2019 Plan: Hold diuretics Potassium replacement as above If potassium stable can likely restart diuretics at lower dose tomorrow  Syncopal event Suspect orthostasis secondary to overdiuresis/dehydration Did receive some IV fluids on admission Plan: No further IV fluids Fall precautions  Essential hypertension PTA valsartan, Coreg Diuretics on hold As needed IV hydralazine  Hyperlipidemia PTA Lipitor  GERD PPI  Anxiety/depression As needed hydroxyzine Daily Prozac  COPD Not acutely exacerbated As needed bronchodilators  Chronic kidney disease stage III Creatinine baseline Avoid nephrotoxins and hypotension  Chronic normocytic anemia Likely secondary to above chronic illnesses Hemoglobin baseline No indication for transfusion      DVT prophylaxis: SQ Lovenox Code Status: Full Family Communication: None today.  Offered to call but the patient declined Disposition Plan:  Level of care: Med-Surg  Consultants:  None  Procedures:  None  Antimicrobials: None   Subjective: Seen and examined.  Reports weakness and fatigue.  No pain complaints  Objective: Vitals:   10/05/21 2009 10/06/21 0415 10/06/21 0736 10/06/21 0800  BP: (!) 137/109 (!) 167/86 (!) 150/86  Pulse: 75 73 68   Resp: 16 20 18    Temp: 99.2 F (37.3 C) 98 F (36.7 C) 97.6 F (36.4 C)   TempSrc: Oral Oral Oral   SpO2: 100% 97%  100%   Weight:    106.6 kg    Intake/Output Summary (Last 24 hours) at 10/06/2021 1134 Last data filed at 10/06/2021 0407 Gross per 24 hour  Intake 240 ml  Output 0 ml  Net 240 ml   Filed Weights   10/06/21 0800  Weight: 106.6 kg    Examination:  General exam: No acute distress Respiratory system: Lungs clear.  Normal work of breathing.  Room air Cardiovascular system: S1-S2, RRR, no murmurs, trace pedal edema Gastrointestinal system: Soft, nontender/nondistended, normal bowel sounds Central nervous system: Alert and oriented. No focal neurological deficits. Extremities: Symmetric 5 x 5 power. Skin: No rashes, lesions or ulcers Psychiatry: Judgement and insight appear normal. Mood & affect appropriate.     Data Reviewed: I have personally reviewed following labs and imaging studies  CBC: Recent Labs  Lab 10/05/21 1609 10/06/21 0439  WBC 10.1 6.7  NEUTROABS 6.7  --   HGB 10.3* 9.2*  HCT 33.1* 28.7*  MCV 80.3 79.7*  PLT 289 034   Basic Metabolic Panel: Recent Labs  Lab 10/05/21 1609 10/06/21 0439  NA 140 141  K 2.4* <2.0*  CL 93* 96*  CO2 33* 36*  GLUCOSE 111* 97  BUN 17 16  CREATININE 1.02* 0.96  CALCIUM 8.0* 7.7*  MG  --  1.6*   GFR: Estimated Creatinine Clearance: 77.8 mL/min (by C-G formula based on SCr of 0.96 mg/dL). Liver Function Tests: Recent Labs  Lab 10/05/21 1609 10/06/21 0439  AST 23 16  ALT 14 10  ALKPHOS 109 98  BILITOT 0.8 0.6  PROT 6.6 6.0*  ALBUMIN 3.0* 2.8*   Recent Labs  Lab 10/05/21 1609  LIPASE 29   No results for input(s): AMMONIA in the last 168 hours. Coagulation Profile: No results for input(s): INR, PROTIME in the last 168 hours. Cardiac Enzymes: No results for input(s): CKTOTAL, CKMB, CKMBINDEX, TROPONINI in the last 168 hours. BNP (last 3 results) Recent Labs    08/29/21 0931  PROBNP 180.0*   HbA1C: No results for input(s): HGBA1C in the last 72 hours. CBG: No results for input(s): GLUCAP in the last  168 hours. Lipid Profile: No results for input(s): CHOL, HDL, LDLCALC, TRIG, CHOLHDL, LDLDIRECT in the last 72 hours. Thyroid Function Tests: No results for input(s): TSH, T4TOTAL, FREET4, T3FREE, THYROIDAB in the last 72 hours. Anemia Panel: No results for input(s): VITAMINB12, FOLATE, FERRITIN, TIBC, IRON, RETICCTPCT in the last 72 hours. Sepsis Labs: Recent Labs  Lab 10/05/21 1618  LATICACIDVEN 1.6    Recent Results (from the past 240 hour(s))  Resp Panel by RT-PCR (Flu A&B, Covid) Nasopharyngeal Swab     Status: None   Collection Time: 10/05/21  5:02 PM   Specimen: Nasopharyngeal Swab; Nasopharyngeal(NP) swabs in vial transport medium  Result Value Ref Range Status   SARS Coronavirus 2 by RT PCR NEGATIVE NEGATIVE Final    Comment: (NOTE) SARS-CoV-2 target nucleic acids are NOT DETECTED.  The SARS-CoV-2 RNA is generally detectable in upper respiratory specimens during the acute phase of infection. The lowest concentration of SARS-CoV-2 viral copies this assay can detect is 138 copies/mL. A negative result does not preclude SARS-Cov-2 infection and should not be used as the sole basis for treatment or other patient management decisions. A negative result may  occur with  improper specimen collection/handling, submission of specimen other than nasopharyngeal swab, presence of viral mutation(s) within the areas targeted by this assay, and inadequate number of viral copies(<138 copies/mL). A negative result must be combined with clinical observations, patient history, and epidemiological information. The expected result is Negative.  Fact Sheet for Patients:  EntrepreneurPulse.com.au  Fact Sheet for Healthcare Providers:  IncredibleEmployment.be  This test is no t yet approved or cleared by the [place] FDA and  has been authorized for detection and/or diagnosis of SARS-CoV-2 by FDA under an Emergency Use Authorization (EUA). This EUA  will remain  in effect (meaning this test can be used) for the duration of the COVID-19 declaration under Section 564(b)(1) of the Act, 21 U.S.C.section 360bbb-3(b)(1), unless the authorization is terminated  or revoked sooner.       Influenza A by PCR NEGATIVE NEGATIVE Final   Influenza B by PCR NEGATIVE NEGATIVE Final    Comment: (NOTE) The Xpert Xpress SARS-CoV-2/FLU/RSV plus assay is intended as an aid in the diagnosis of influenza from Nasopharyngeal swab specimens and should not be used as a sole basis for treatment. Nasal washings and aspirates are unacceptable for Xpert Xpress SARS-CoV-2/FLU/RSV testing.  Fact Sheet for Patients: EntrepreneurPulse.com.au  Fact Sheet for Healthcare Providers: IncredibleEmployment.be  This test is not yet approved or cleared by the [place] FDA and has been authorized for detection and/or diagnosis of SARS-CoV-2 by FDA under an Emergency Use Authorization (EUA). This EUA will remain in effect (meaning this test can be used) for the duration of the COVID-19 declaration under Section 564(b)(1) of the Act, 21 U.S.C. section 360bbb-3(b)(1), unless the authorization is terminated or revoked.  Performed at [facility], [address], [place], [place] [address]          Radiology Studies: [facility] Chest [place] 1 View  Result Date: 10/05/2021 CLINICAL DATA:  Shortness of breath. Additional history provided: Patient reports left-sided chest pain and vomiting today. History of hypertension and CHF. EXAM: PORTABLE CHEST 1 VIEW COMPARISON:  Prior chest radiographs 09/20/2021 and earlier. Esophagram 09/18/2021. FINDINGS: Heart size within normal limits. Unchanged chronic prominence of the interstitial lung markings. Mild ill-defined opacity at the lateral left lung base, likely reflecting atelectasis. No evidence of pleural effusion or pneumothorax. No acute bony abnormality identified. Redemonstrated  sequela of prior thoracic vertebral augmentation. IMPRESSION: Mild ill-defined opacity at the lateral left lung base, likely reflecting atelectasis. Otherwise, no evidence of acute cardiopulmonary abnormality. Electronically Signed   By: Kellie Simmering D.O.   On: 10/05/2021 16:45        Scheduled Meds:  aspirin EC  81 mg Oral Daily   atorvastatin  40 mg Oral Daily   carvedilol  6.25 mg Oral BID WC   enoxaparin (LOVENOX) injection  0.5 mg/kg Subcutaneous Q24H   irbesartan  150 mg Oral Daily   mirtazapine  15 mg Oral QHS   Continuous Infusions:  magnesium sulfate bolus IVPB     potassium chloride 10 mEq (10/06/21 1037)     LOS: 1 day    Time spent: 35 minutes    Sidney Ace, MD [facility]   If 7PM-7AM, please contact night-coverage  10/06/2021, 11:34 AM

## 2021-10-06 NOTE — Progress Notes (Signed)
Initial Nutrition Assessment  DOCUMENTATION CODES:  Obesity unspecified  INTERVENTION:  Add Ensure Plus High Protein po TID, each supplement provides 350 kcal and 20 grams of protein.   Add MVI with minerals daily.  NUTRITION DIAGNOSIS:  Inadequate oral intake related to nausea as evidenced by per patient/family report.  GOAL:  Patient will meet greater than or equal to 90% of their needs  MONITOR:  PO intake, Supplement acceptance, Labs, Weight trends, I & O's  REASON FOR ASSESSMENT:  Malnutrition Screening Tool    ASSESSMENT:  70 yo female with a PMH of diastolic dysfunction CHF last echo in our system from September 2019, mitral valve prolapse, essential hypertension, GERD, depression, anxiety disorder: Osteoarthritis, iron deficiency anemia, chronic kidney disease stage IIIa who presents with hypokalemia.  Pt unavailable during RD visit.  No meals documented in Epic.  Per Epic, pt has lost about 9.5 lbs (3.8%) in the last 4 months, which is not necessarily significant for the time frame.  Medications: reviewed; Remeron, Protonix, Mag-Sulfate per IV, Zofran PRN (given once today), oxycodone PO PRN (given once today)  Labs: reviewed; K 3 (L)  NUTRITION - FOCUSED PHYSICAL EXAM: Unable to perform - defer to follow-up  Diet Order:   Diet Order             Diet Heart Room service appropriate? Yes; Fluid consistency: Thin  Diet effective now                  EDUCATION NEEDS:  No education needs have been identified at this time  Skin:  Skin Assessment: Reviewed RN Assessment  Last BM:  PTA  Height:  Ht Readings from Last 1 Encounters:  09/11/21 6\' 1"  (1.854 m)   Weight:  Wt Readings from Last 1 Encounters:  10/06/21 106.6 kg   BMI:  Body mass index is 31.01 kg/m.  Estimated Nutritional Needs:  Kcal:  1900-2100 Protein:  115-130 grams Fluid:  >1.9 L  Derrel Nip, RD, LDN (she/her/hers) Registered Dietitian I After-Hours/Weekend Pager # in  [phone]

## 2021-10-06 NOTE — Progress Notes (Signed)
   10/06/21 1815  Clinical Encounter Type  Visited With Patient  Visit Type Initial;Spiritual support;Social support  Referral From Nurse  Consult/Referral To [employer] completed AD education with PT. Chaplain made space for expression of emotions.  Andee Poles, M. Div.

## 2021-10-07 DIAGNOSIS — E876 Hypokalemia: Secondary | ICD-10-CM | POA: Diagnosis not present

## 2021-10-07 LAB — BASIC METABOLIC PANEL
Anion gap: 7 (ref 5–15)
BUN: 14 mg/dL (ref 8–23)
CO2: 34 mmol/L — ABNORMAL HIGH (ref 22–32)
Calcium: 8.3 mg/dL — ABNORMAL LOW (ref 8.9–10.3)
Chloride: 101 mmol/L (ref 98–111)
Creatinine, Ser: 0.87 mg/dL (ref 0.44–1.00)
GFR, Estimated: >60 mL/min (ref >60)
Glucose, Bld: 103 mg/dL — ABNORMAL HIGH (ref 70–99)
Potassium: 3.5 mmol/L (ref 3.5–5.1)
Sodium: 142 mmol/L (ref 135–145)

## 2021-10-07 LAB — MAGNESIUM: Magnesium: 2.8 mg/dL — ABNORMAL HIGH (ref 1.7–2.4)

## 2021-10-07 MED ORDER — POTASSIUM CHLORIDE CRYS ER 20 MEQ PO TBCR: 20.0000 meq | EXTENDED_RELEASE_TABLET | Freq: Every day | ORAL | 0 refills | Status: DC

## 2021-10-07 MED ORDER — FUROSEMIDE 40 MG PO TABS: 40.0000 mg | ORAL_TABLET | Freq: Every day | ORAL | 0 refills | Status: DC

## 2021-10-07 NOTE — Discharge Summary (Signed)
Physician Discharge Summary  Morgan [name] VQQ:[id number] DOB: 1951/01/27 DOA: 10/05/2021  PCP: [name], MD  Admit date: 10/05/2021 Discharge date: 10/07/2021  Admitted From: Home Disposition: Home  Recommendations for Outpatient Follow-up:  Follow up with PCP in 1-2 weeks   Home Health: No Equipment/Devices: None  Discharge Condition: Stable CODE STATUS: Full Diet recommendation: Heart healthy  Brief/Interim Summary: 70 y.o. female with medical history significant of diastolic dysfunction CHF last echo in our system from September 2019, mitral valve prolapse, essential hypertension, GERD, depression, anxiety disorder: Osteoarthritis, iron deficiency anemia, chronic kidney disease stage IIIa among other things who is chronically on Lasix for her CHF and has been known to have hypokalemia as a result.  Patient apparently has not been taking her potassium because she does not want to take it.  She has not been feeling well lately having chest pain rated as 6 out of 10 which is substernal.  She was seen at the [place] [facility] today where she was on a routine visit then started having the chest pain.  That was associated with some nausea and vomiting.  Her EKG showed intermittent bigeminy and while there patient apparently had an episode of loss of consciousness.  She was more less shaking at the time.  She was fully awake when she arrived the ER.  They did give her some sternal rub apparently.  Also aspirin and nitroglycerin.  Patient was found to have a potassium of 2.4.     Likely etiology of hypokalemia was aggressive diuresis, and adequate potassium replacement, nausea and vomiting associated with poor p.o. intake.  I discussed with potassium and magnesium were aggressively replaced in the hospital.  Potassium 3.5 magnesium 2.8 at time of discharge.  I had a lengthy discussion with patient regarding her diuresis.  For now I recommended reducing her Lasix to 40 mg once daily in addition  to 20 mEq of oral potassium once daily.  I encouraged her to monitor her weight and her volume status at home and to take an extra dose of Lasix along with potassium for worsening edema.  I also recommended patient follow-up with her prescribing provider for further discussion regarding diuresis and potassium replacement.    Discharge Diagnoses:  Principal Problem:   Hypokalemia Active Problems:   Essential hypertension   Anxiety   HLD (hyperlipidemia)   GERD (gastroesophageal reflux disease)   CKD (chronic kidney disease) stage 3, GFR 30-59 ml/min (HCC)   Chronic diastolic CHF (congestive heart failure) (HCC)   COPD (chronic obstructive pulmonary disease) (HCC)  Severe hypokalemia Hypomagnesemia More than likely from overdiuresis Also apparently some nausea and vomiting prior to coming in Plan: Potassium and magnesium both in reference range at time of discharge.  Will recommend to reduce home Lasix dose to 40 mg once day and K. Dur 20 mEq once daily.  Encouraged to check weight at home and take an extra dose of Lasix as well as K. Dur for worsening edema or weight gain.   Chronic diastolic congestive heart failure Patient states her home diuretics were recently increased to 40 mg Lasix twice daily Patient has clinical indications of dehydration New echo ordered on admission as last was from 2019 Plan: See above for diuretic plan   Syncopal event Suspect orthostasis secondary to overdiuresis/dehydration Did receive some IV fluids on admission Plan: No further IV fluids Fall precautions Ambulating without difficulty at time of discharge   Essential hypertension Can resume home PTA valsartan, Coreg, diuretics  Discharge Instructions  Discharge Instructions     Diet - low sodium heart healthy   Complete by: As directed    Increase activity slowly   Complete by: As directed       Allergies as of 10/07/2021       Reactions   Meperidine Hives, Anaphylaxis    Strawberry Extract Hives, Swelling   Carafate [sucralfate] Nausea Only, Other (See Comments)   Severe nausea   Wasp Venom Hives        Medication List     STOP taking these medications    amoxicillin-clavulanate 875-125 MG tablet Commonly known as: AUGMENTIN       TAKE these medications    albuterol 108 (90 Base) MCG/ACT inhaler Commonly known as: VENTOLIN HFA Inhale 1-2 puffs into the lungs every 6 (six) hours as needed for wheezing or shortness of breath.   alendronate 70 MG tablet Commonly known as: FOSAMAX Take 1 tablet (70 mg total) by mouth every 7 (seven) days. Take with a full glass of water on an empty stomach.   aspirin 81 MG EC tablet Take 1 tablet (81 mg total) by mouth daily. Swallow whole.   aspirin-acetaminophen-caffeine 250-250-65 MG tablet Commonly known as: EXCEDRIN MIGRAINE Take 1-2 tablets by mouth every 6 (six) hours as needed for headache.   atorvastatin 40 MG tablet Commonly known as: LIPITOR Take 1 tablet (40 mg total) by mouth daily.   carvedilol 6.25 MG tablet Commonly known as: COREG Take 1 tablet (6.25 mg total) by mouth 2 (two) times daily with a meal.   cetirizine 10 MG tablet Commonly known as: ZyrTEC Allergy Take 1 tablet (10 mg total) by mouth daily.   FLUoxetine 40 MG capsule Commonly known as: PROZAC Take 40 mg by mouth daily.   furosemide 40 MG tablet Commonly known as: LASIX Take 1 tablet (40 mg total) by mouth daily. What changed:  when to take this reasons to take this   gabapentin 400 MG capsule Commonly known as: NEURONTIN Take 400 mg by mouth 3 (three) times daily as needed (for nerve pain).   hydrOXYzine 25 MG tablet Commonly known as: ATARAX/VISTARIL Take 25 mg by mouth every 8 (eight) hours as needed for anxiety.   ibuprofen 600 MG tablet Commonly known as: ADVIL Take 1 tablet (600 mg total) by mouth every 6 (six) hours as needed.   meloxicam 7.5 MG tablet Commonly known as: MOBIC Take 7.5 mg by  mouth daily.   mirtazapine 15 MG tablet Commonly known as: REMERON Take 15 mg by mouth at bedtime.   omeprazole 40 MG capsule Commonly known as: PRILOSEC TAKE 1 CAPSULE BY MOUTH EVERY DAY What changed: how much to take   ondansetron 4 MG disintegrating tablet Commonly known as: ZOFRAN-ODT Take 1-2 tablets by mouth every 6 hours as needed   OXYGEN Inhale 2 L/min into the lungs See admin instructions. 2 L/min at bedtime and as needed for shortness of breath throughout the day   polyethylene glycol-electrolytes 420 g solution Commonly known as: NuLYTELY Take 4,000 mLs by mouth as directed.   potassium chloride SA 20 MEQ tablet Commonly known as: KLOR-CON Take 1 tablet (20 mEq total) by mouth daily. With furosemide   predniSONE 20 MG tablet Commonly known as: DELTASONE Take 2 tablets daily with breakfast.   valsartan 320 MG tablet Commonly known as: DIOVAN Take 320 mg by mouth daily.        Allergies  Allergen Reactions   Meperidine Hives and Anaphylaxis  Strawberry Extract Hives and Swelling   Carafate [Sucralfate] Nausea Only and Other (See Comments)    Severe nausea   Wasp Venom Hives    Consultations: None   Procedures/Studies: DG Chest 2 View  Result Date: 09/20/2021 CLINICAL DATA:  Cough. EXAM: CHEST - 2 VIEW COMPARISON:  Chest x-ray 09/12/2021. FINDINGS: The heart size and mediastinal contours are within normal limits. Both lungs are clear. Multiple thoracic level vertebroplasty changes are again noted. There is stable scarring in both lung apices. IMPRESSION: No active cardiopulmonary disease. Electronically Signed   By: Ronney Asters M.D.   On: 09/20/2021 16:43   DG Chest 2 View  Result Date: 09/12/2021 CLINICAL DATA:  Dyspnea.  Sore throat and cough. EXAM: CHEST - 2 VIEW COMPARISON:  08/28/2021 FINDINGS: The cardiomediastinal silhouette is unchanged with normal heart size. The lungs are hyperinflated with chronic interstitial coarsening. No airspace  consolidation, overt pulmonary edema, pleural effusion, or pneumothorax is identified. Prior thoracic vertebral augmentation and an old sternal fracture are again noted. IMPRESSION: No active cardiopulmonary disease. Electronically Signed   By: Logan Bores M.D.   On: 09/12/2021 08:45   DG Chest Port 1 View  Result Date: 10/05/2021 CLINICAL DATA:  Shortness of breath. Additional history provided: Patient reports left-sided chest pain and vomiting today. History of hypertension and CHF. EXAM: PORTABLE CHEST 1 VIEW COMPARISON:  Prior chest radiographs 09/20/2021 and earlier. Esophagram 09/18/2021. FINDINGS: Heart size within normal limits. Unchanged chronic prominence of the interstitial lung markings. Mild ill-defined opacity at the lateral left lung base, likely reflecting atelectasis. No evidence of pleural effusion or pneumothorax. No acute bony abnormality identified. Redemonstrated sequela of prior thoracic vertebral augmentation. IMPRESSION: Mild ill-defined opacity at the lateral left lung base, likely reflecting atelectasis. Otherwise, no evidence of acute cardiopulmonary abnormality. Electronically Signed   By: Kellie Simmering D.O.   On: 10/05/2021 16:45   DG ESOPHAGUS W SINGLE CM (SOL OR THIN BA)  Result Date: 09/18/2021 CLINICAL DATA:  Dysphagia, difficulty dry swallowing, feels like food getting stuck in throat EXAM: ESOPHOGRAM / BARIUM SWALLOW / BARIUM TABLET STUDY TECHNIQUE: Combined double contrast and single contrast examination performed using effervescent crystals, thick barium liquid, and thin barium liquid. The patient was observed with fluoroscopy swallowing a 13 mm barium sulphate tablet. FLUOROSCOPY TIME:  Fluoroscopy Time:  5.3 minutes Radiation Exposure Index (if provided by the fluoroscopic device): 82.8 mGy Number of Acquired Spot Images: 0 COMPARISON:  Esophagram 11/29/2020 FINDINGS: Esophageal distention: The esophagus demonstrated normal distensibility. The GE junction appeared to  distend normally. Filling defects:  No discrete filling defects were identified. 12.5 mm barium tablet: The barium tablet held up at the GE junction for several minutes and persisted with multiple sips of water and barium. Motility: There is moderate dysmotility with uncoordinated peristaltic stripping wave. Mucosa:  No discrete mucosal lesions are identified. Hypopharynx/cervical esophagus:  No aspiration was identified. Hiatal hernia:  None identified. GE reflux:  There was reflux to the level of the thoracic inlet. Other:  None. IMPRESSION: 1. The barium tablet held up at the GE junction for several minutes. No discrete mucosal lesion or filling defect was identified. Consider correlation with endoscopy. 2. Moderate dysmotility. 3. Esophageal reflux to the level of the thoracic inlet. Electronically Signed   By: Valetta Mole M.D.   On: 09/18/2021 09:07      Subjective: Seen and examined at the time of discharge.  Stable no distress.  Electrolytes in reference range.  Stable for discharge home.  Discharge  Exam: Vitals:   10/07/21 0444 10/07/21 0824  BP: (!) 151/71 (!) 154/81  Pulse: 64 66  Resp: 18 20  Temp: 98.1 F (36.7 C) 98.1 F (36.7 C)  SpO2: 100% 98%   Vitals:   10/06/21 1548 10/06/21 2056 10/07/21 0444 10/07/21 0824  BP: (!) 158/84 140/75 (!) 151/71 (!) 154/81  Pulse: 65 61 64 66  Resp: 18 20 18 20   Temp: 98.6 F (37 C) 98.4 F (36.9 C) 98.1 F (36.7 C) 98.1 F (36.7 C)  TempSrc: Oral Oral Oral Oral  SpO2: 99% 100% 100% 98%  Weight:      Height:        General: Pt is alert, awake, not in acute distress Cardiovascular: RRR, S1/S2 +, no rubs, no gallops Respiratory: CTA bilaterally, no wheezing, no rhonchi Abdominal: Soft, NT, ND, bowel sounds + Extremities: no edema, no cyanosis    The results of significant diagnostics from this hospitalization (including imaging, microbiology, ancillary and laboratory) are listed below for reference.     Microbiology: Recent  Results (from the past 240 hour(s))  Resp Panel by RT-PCR (Flu A&B, Covid) Nasopharyngeal Swab     Status: None   Collection Time: 10/05/21  5:02 PM   Specimen: Nasopharyngeal Swab; Nasopharyngeal(NP) swabs in vial transport medium  Result Value Ref Range Status   SARS Coronavirus 2 by RT PCR NEGATIVE NEGATIVE Final    Comment: (NOTE) SARS-CoV-2 target nucleic acids are NOT DETECTED.  The SARS-CoV-2 RNA is generally detectable in upper respiratory specimens during the acute phase of infection. The lowest concentration of SARS-CoV-2 viral copies this assay can detect is 138 copies/mL. A negative result does not preclude SARS-Cov-2 infection and should not be used as the sole basis for treatment or other patient management decisions. A negative result may occur with  improper specimen collection/handling, submission of specimen other than nasopharyngeal swab, presence of viral mutation(s) within the areas targeted by this assay, and inadequate number of viral copies(<138 copies/mL). A negative result must be combined with clinical observations, patient history, and epidemiological information. The expected result is Negative.  Fact Sheet for Patients:  EntrepreneurPulse.com.au  Fact Sheet for Healthcare Providers:  IncredibleEmployment.be  This test is no t yet approved or cleared by the [place] FDA and  has been authorized for detection and/or diagnosis of SARS-CoV-2 by FDA under an Emergency Use Authorization (EUA). This EUA will remain  in effect (meaning this test can be used) for the duration of the COVID-19 declaration under Section 564(b)(1) of the Act, 21 U.S.C.section 360bbb-3(b)(1), unless the authorization is terminated  or revoked sooner.       Influenza A by PCR NEGATIVE NEGATIVE Final   Influenza B by PCR NEGATIVE NEGATIVE Final    Comment: (NOTE) The Xpert Xpress SARS-CoV-2/FLU/RSV plus assay is intended as an aid in the  diagnosis of influenza from Nasopharyngeal swab specimens and should not be used as a sole basis for treatment. Nasal washings and aspirates are unacceptable for Xpert Xpress SARS-CoV-2/FLU/RSV testing.  Fact Sheet for Patients: EntrepreneurPulse.com.au  Fact Sheet for Healthcare Providers: IncredibleEmployment.be  This test is not yet approved or cleared by the [place] FDA and has been authorized for detection and/or diagnosis of SARS-CoV-2 by FDA under an Emergency Use Authorization (EUA). This EUA will remain in effect (meaning this test can be used) for the duration of the COVID-19 declaration under Section 564(b)(1) of the Act, 21 U.S.C. section 360bbb-3(b)(1), unless the authorization is terminated or revoked.  Performed at  [facility], [address] [place], [place] [address]      Labs: BNP (last 3 results) Recent Labs    02/05/21 1635 02/20/21 1632  BNP 82.5 546.2*   Basic Metabolic Panel: Recent Labs  Lab 10/05/21 1609 10/06/21 0439 10/06/21 1343 10/07/21 0431  NA 140 141  --  142  K 2.4* <2.0* 3.0* 3.5  CL 93* 96*  --  101  CO2 33* 36*  --  34*  GLUCOSE 111* 97  --  103*  BUN 17 16  --  14  CREATININE 1.02* 0.96  --  0.87  CALCIUM 8.0* 7.7*  --  8.3*  MG  --  1.6* 1.7 2.8*   Liver Function Tests: Recent Labs  Lab 10/05/21 1609 10/06/21 0439  AST 23 16  ALT 14 10  ALKPHOS 109 98  BILITOT 0.8 0.6  PROT 6.6 6.0*  ALBUMIN 3.0* 2.8*   Recent Labs  Lab 10/05/21 1609  LIPASE 29   No results for input(s): AMMONIA in the last 168 hours. CBC: Recent Labs  Lab 10/05/21 1609 10/06/21 0439  WBC 10.1 6.7  NEUTROABS 6.7  --   HGB 10.3* 9.2*  HCT 33.1* 28.7*  MCV 80.3 79.7*  PLT 289 259   Cardiac Enzymes: No results for input(s): CKTOTAL, CKMB, CKMBINDEX, TROPONINI in the last 168 hours. BNP: Invalid input(s): POCBNP CBG: No results for input(s): GLUCAP in the last 168  hours. D-Dimer No results for input(s): DDIMER in the last 72 hours. Hgb A1c No results for input(s): HGBA1C in the last 72 hours. Lipid Profile No results for input(s): CHOL, HDL, LDLCALC, TRIG, CHOLHDL, LDLDIRECT in the last 72 hours. Thyroid function studies No results for input(s): TSH, T4TOTAL, T3FREE, THYROIDAB in the last 72 hours.  Invalid input(s): FREET3 Anemia work up No results for input(s): VITAMINB12, FOLATE, FERRITIN, TIBC, IRON, RETICCTPCT in the last 72 hours. Urinalysis    Component Value Date/Time   COLORURINE YELLOW 02/05/2021 0749   APPEARANCEUR HAZY (A) 02/05/2021 0749   APPEARANCEUR Cloudy 09/18/2012 2000   LABSPEC 1.010 02/05/2021 0749   LABSPEC 1.012 09/18/2012 2000   PHURINE 5.0 02/05/2021 0749   GLUCOSEU NEGATIVE 02/05/2021 0749   GLUCOSEU Negative 09/18/2012 2000   HGBUR SMALL (A) 02/05/2021 0749   BILIRUBINUR NEGATIVE 02/05/2021 0749   BILIRUBINUR negative 04/08/2019 1103   BILIRUBINUR Negative 09/18/2012 2000   KETONESUR NEGATIVE 02/05/2021 0749   PROTEINUR NEGATIVE 02/05/2021 0749   UROBILINOGEN 0.2 04/08/2019 1103   UROBILINOGEN 0.2 05/25/2017 1743   NITRITE NEGATIVE 02/05/2021 0749   LEUKOCYTESUR LARGE (A) 02/05/2021 0749   LEUKOCYTESUR Trace 09/18/2012 2000   Sepsis Labs Invalid input(s): PROCALCITONIN,  WBC,  LACTICIDVEN Microbiology Recent Results (from the past 240 hour(s))  Resp Panel by RT-PCR (Flu A&B, Covid) Nasopharyngeal Swab     Status: None   Collection Time: 10/05/21  5:02 PM   Specimen: Nasopharyngeal Swab; Nasopharyngeal(NP) swabs in vial transport medium  Result Value Ref Range Status   SARS Coronavirus 2 by RT PCR NEGATIVE NEGATIVE Final    Comment: (NOTE) SARS-CoV-2 target nucleic acids are NOT DETECTED.  The SARS-CoV-2 RNA is generally detectable in upper respiratory specimens during the acute phase of infection. The lowest concentration of SARS-CoV-2 viral copies this assay can detect is 138 copies/mL. A negative  result does not preclude SARS-Cov-2 infection and should not be used as the sole basis for treatment or other patient management decisions. A negative result may occur with  improper specimen collection/handling, submission  of specimen other than nasopharyngeal swab, presence of viral mutation(s) within the areas targeted by this assay, and inadequate number of viral copies(<138 copies/mL). A negative result must be combined with clinical observations, patient history, and epidemiological information. The expected result is Negative.  Fact Sheet for Patients:  EntrepreneurPulse.com.au  Fact Sheet for Healthcare Providers:  IncredibleEmployment.be  This test is no t yet approved or cleared by the [place] FDA and  has been authorized for detection and/or diagnosis of SARS-CoV-2 by FDA under an Emergency Use Authorization (EUA). This EUA will remain  in effect (meaning this test can be used) for the duration of the COVID-19 declaration under Section 564(b)(1) of the Act, 21 U.S.C.section 360bbb-3(b)(1), unless the authorization is terminated  or revoked sooner.       Influenza A by PCR NEGATIVE NEGATIVE Final   Influenza B by PCR NEGATIVE NEGATIVE Final    Comment: (NOTE) The Xpert Xpress SARS-CoV-2/FLU/RSV plus assay is intended as an aid in the diagnosis of influenza from Nasopharyngeal swab specimens and should not be used as a sole basis for treatment. Nasal washings and aspirates are unacceptable for Xpert Xpress SARS-CoV-2/FLU/RSV testing.  Fact Sheet for Patients: EntrepreneurPulse.com.au  Fact Sheet for Healthcare Providers: IncredibleEmployment.be  This test is not yet approved or cleared by the [place] FDA and has been authorized for detection and/or diagnosis of SARS-CoV-2 by FDA under an Emergency Use Authorization (EUA). This EUA will remain in effect (meaning this test can be used)  for the duration of the COVID-19 declaration under Section 564(b)(1) of the Act, 21 U.S.C. section 360bbb-3(b)(1), unless the authorization is terminated or revoked.  Performed at [facility], [address], [place],  [address]      Time coordinating discharge: Over 30 minutes  SIGNED:   Sidney Ace, MD  [facility] 10/07/2021, 11:45 AM Pager   If 7PM-7AM, please contact night-coverage

## 2021-10-07 NOTE — Plan of Care (Signed)
Discharge teaching completed and patient in stable condition.

## 2021-10-09 ENCOUNTER — Telehealth: Payer: Self-pay

## 2021-10-09 NOTE — Telephone Encounter (Signed)
Advised in message that pt having symptoms of possible UTI  Please call pt and offer earlier appointment if urinary symptoms do not improve

## 2021-10-09 NOTE — Telephone Encounter (Cosign Needed)
Transition Care Management Follow-up Telephone Call Date of discharge and from where: 10/07/21, from [facility] How have you been since you were released from the hospital? Patient states that she has felt tired and does not have energy.  Any questions or concerns? No  Items Reviewed: Did the pt receive and understand the discharge instructions provided? Yes  , patient wanted clarification on Lasix medication. Medications obtained and verified? Yes  Other? No  Any new allergies since your discharge? No  Dietary orders reviewed? Yes,  Reviewed heathy diet. Do you have support at home? Yes , patient has a roommate.  Home Care and Equipment/Supplies: Were home health services ordered? no If so, what is the name of the agency? N/A  Has the agency set up a time to come to the patient's home? not applicable Were any new equipment or medical supplies ordered?  No What is the name of the medical supply agency? N/A Were you able to get the supplies/equipment? not applicable Do you have any questions related to the use of the equipment or supplies? No  Functional Questionnaire: (I = Independent and D = Dependent) ADLs: I with some assistance  Bathing/Dressing- I  Meal Prep- I with assistance  Eating- I  Maintaining continence- I  Transferring/Ambulation- I with use of walker  Managing Meds- I  Follow up appointments reviewed:  PCP Hospital f/u appt confirmed? Yes  Scheduled to see Dr. Trilby Drummer on 10/17/21 @ 2:20pm. [place] [facility] f/u appt confirmed? No  , patient will discuss with PCP on following up with Cardiology  Are transportation arrangements needed? No  If their condition worsens, is the pt aware to call PCP or go to the Emergency Dept.? Yes Was the patient provided with contact information for the PCP's office or ED? Yes Was to pt encouraged to call back with questions or concerns? Yes

## 2021-10-10 ENCOUNTER — Telehealth: Payer: Self-pay | Admitting: *Deleted

## 2021-10-10 DIAGNOSIS — E785 Hyperlipidemia, unspecified: Secondary | ICD-10-CM | POA: Diagnosis not present

## 2021-10-10 DIAGNOSIS — J449 Chronic obstructive pulmonary disease, unspecified: Secondary | ICD-10-CM | POA: Diagnosis not present

## 2021-10-10 DIAGNOSIS — G629 Polyneuropathy, unspecified: Secondary | ICD-10-CM | POA: Diagnosis not present

## 2021-10-10 DIAGNOSIS — I509 Heart failure, unspecified: Secondary | ICD-10-CM | POA: Diagnosis not present

## 2021-10-10 DIAGNOSIS — I110 Hypertensive heart disease with heart failure: Secondary | ICD-10-CM | POA: Diagnosis not present

## 2021-10-10 DIAGNOSIS — G40909 Epilepsy, unspecified, not intractable, without status epilepticus: Secondary | ICD-10-CM | POA: Diagnosis not present

## 2021-10-10 DIAGNOSIS — E669 Obesity, unspecified: Secondary | ICD-10-CM | POA: Diagnosis not present

## 2021-10-10 DIAGNOSIS — R69 Illness, unspecified: Secondary | ICD-10-CM | POA: Diagnosis not present

## 2021-10-10 DIAGNOSIS — I70209 Unspecified atherosclerosis of native arteries of extremities, unspecified extremity: Secondary | ICD-10-CM | POA: Diagnosis not present

## 2021-10-10 DIAGNOSIS — J9611 Chronic respiratory failure with hypoxia: Secondary | ICD-10-CM | POA: Diagnosis not present

## 2021-10-10 DIAGNOSIS — F419 Anxiety disorder, unspecified: Secondary | ICD-10-CM | POA: Diagnosis not present

## 2021-10-10 DIAGNOSIS — E261 Secondary hyperaldosteronism: Secondary | ICD-10-CM | POA: Diagnosis not present

## 2021-10-10 DIAGNOSIS — F3341 Major depressive disorder, recurrent, in partial remission: Secondary | ICD-10-CM | POA: Diagnosis not present

## 2021-10-10 NOTE — Telephone Encounter (Signed)
Appointment scheduled, patient notified.

## 2021-10-10 NOTE — Telephone Encounter (Signed)
Patient called stating that she came in for iron infusion 10/20, but ended up going to hospital and was admitted and never got her iron. Her next appointment is not until Jan 2023. She is asking if she needs to come in for the iron infusion she missed on 10/20. Please advise

## 2021-10-12 ENCOUNTER — Inpatient Hospital Stay: Payer: Medicare HMO

## 2021-10-12 ENCOUNTER — Other Ambulatory Visit: Payer: Self-pay

## 2021-10-12 VITALS — BP 144/94 | HR 76 | Temp 97.0°F | Resp 18

## 2021-10-12 DIAGNOSIS — Z87891 Personal history of nicotine dependence: Secondary | ICD-10-CM | POA: Diagnosis not present

## 2021-10-12 DIAGNOSIS — F5101 Primary insomnia: Secondary | ICD-10-CM | POA: Diagnosis not present

## 2021-10-12 DIAGNOSIS — Z803 Family history of malignant neoplasm of breast: Secondary | ICD-10-CM | POA: Diagnosis not present

## 2021-10-12 DIAGNOSIS — K449 Diaphragmatic hernia without obstruction or gangrene: Secondary | ICD-10-CM | POA: Diagnosis not present

## 2021-10-12 DIAGNOSIS — Z800 Family history of malignant neoplasm of digestive organs: Secondary | ICD-10-CM | POA: Diagnosis not present

## 2021-10-12 DIAGNOSIS — F321 Major depressive disorder, single episode, moderate: Secondary | ICD-10-CM | POA: Diagnosis not present

## 2021-10-12 DIAGNOSIS — D508 Other iron deficiency anemias: Secondary | ICD-10-CM

## 2021-10-12 DIAGNOSIS — I509 Heart failure, unspecified: Secondary | ICD-10-CM | POA: Diagnosis not present

## 2021-10-12 DIAGNOSIS — D631 Anemia in chronic kidney disease: Secondary | ICD-10-CM | POA: Diagnosis not present

## 2021-10-12 DIAGNOSIS — K219 Gastro-esophageal reflux disease without esophagitis: Secondary | ICD-10-CM | POA: Diagnosis not present

## 2021-10-12 DIAGNOSIS — D509 Iron deficiency anemia, unspecified: Secondary | ICD-10-CM | POA: Diagnosis not present

## 2021-10-12 DIAGNOSIS — Z79899 Other long term (current) drug therapy: Secondary | ICD-10-CM | POA: Diagnosis not present

## 2021-10-12 DIAGNOSIS — I129 Hypertensive chronic kidney disease with stage 1 through stage 4 chronic kidney disease, or unspecified chronic kidney disease: Secondary | ICD-10-CM | POA: Diagnosis not present

## 2021-10-12 DIAGNOSIS — Z8041 Family history of malignant neoplasm of ovary: Secondary | ICD-10-CM | POA: Diagnosis not present

## 2021-10-12 DIAGNOSIS — R69 Illness, unspecified: Secondary | ICD-10-CM | POA: Diagnosis not present

## 2021-10-12 DIAGNOSIS — N1831 Chronic kidney disease, stage 3a: Secondary | ICD-10-CM | POA: Diagnosis not present

## 2021-10-12 DIAGNOSIS — R5383 Other fatigue: Secondary | ICD-10-CM | POA: Diagnosis not present

## 2021-10-12 DIAGNOSIS — F419 Anxiety disorder, unspecified: Secondary | ICD-10-CM | POA: Diagnosis not present

## 2021-10-12 DIAGNOSIS — I341 Nonrheumatic mitral (valve) prolapse: Secondary | ICD-10-CM | POA: Diagnosis not present

## 2021-10-12 MED ORDER — IRON SUCROSE 20 MG/ML IV SOLN
200.0000 mg | Freq: Once | INTRAVENOUS | Status: AC
  Administered 2021-10-12: 200 mg via INTRAVENOUS
  Filled 2021-10-12: qty 10

## 2021-10-12 MED ORDER — SODIUM CHLORIDE 0.9 % IV SOLN
Freq: Once | INTRAVENOUS | Status: AC
  Filled 2021-10-12: qty 250

## 2021-10-12 MED ORDER — SODIUM CHLORIDE 0.9 % IV SOLN: 200.0000 mg | Freq: Once | INTRAVENOUS | Status: DC

## 2021-10-12 NOTE — Patient Instructions (Signed)

## 2021-10-13 ENCOUNTER — Ambulatory Visit
Admission: RE | Admit: 2021-10-13 | Discharge: 2021-10-13 | Disposition: A | Discharge status: Home / Self Care | Payer: Medicare HMO | Source: Ambulatory Visit | Attending: Gastroenterology | Admitting: Gastroenterology

## 2021-10-13 DIAGNOSIS — R110 Nausea: Secondary | ICD-10-CM | POA: Insufficient documentation

## 2021-10-13 DIAGNOSIS — G8929 Other chronic pain: Secondary | ICD-10-CM | POA: Diagnosis not present

## 2021-10-13 DIAGNOSIS — R109 Unspecified abdominal pain: Secondary | ICD-10-CM | POA: Diagnosis not present

## 2021-10-13 MED ORDER — TECHNETIUM TC 99M SULFUR COLLOID
2.3800 | Freq: Once | INTRAVENOUS | Status: AC | PRN
  Administered 2021-10-13: 2.38 via ORAL

## 2021-10-16 DIAGNOSIS — R69 Illness, unspecified: Secondary | ICD-10-CM | POA: Diagnosis not present

## 2021-10-16 DIAGNOSIS — I10 Essential (primary) hypertension: Secondary | ICD-10-CM

## 2021-10-16 DIAGNOSIS — I5032 Chronic diastolic (congestive) heart failure: Secondary | ICD-10-CM | POA: Diagnosis not present

## 2021-10-16 DIAGNOSIS — E782 Mixed hyperlipidemia: Secondary | ICD-10-CM | POA: Diagnosis not present

## 2021-10-16 DIAGNOSIS — J449 Chronic obstructive pulmonary disease, unspecified: Secondary | ICD-10-CM | POA: Diagnosis not present

## 2021-10-16 DIAGNOSIS — N1831 Chronic kidney disease, stage 3a: Secondary | ICD-10-CM

## 2021-10-16 DIAGNOSIS — F32A Depression, unspecified: Secondary | ICD-10-CM | POA: Diagnosis not present

## 2021-10-17 ENCOUNTER — Encounter: Payer: Self-pay | Admitting: Family Medicine

## 2021-10-17 ENCOUNTER — Other Ambulatory Visit: Payer: Self-pay

## 2021-10-17 ENCOUNTER — Ambulatory Visit (INDEPENDENT_AMBULATORY_CARE_PROVIDER_SITE_OTHER): Payer: Medicare HMO | Admitting: Family Medicine

## 2021-10-17 VITALS — BP 132/76 | HR 83 | Temp 97.1°F | Ht 72.99 in | Wt 228.0 lb

## 2021-10-17 DIAGNOSIS — J449 Chronic obstructive pulmonary disease, unspecified: Secondary | ICD-10-CM | POA: Diagnosis not present

## 2021-10-17 DIAGNOSIS — N898 Other specified noninflammatory disorders of vagina: Secondary | ICD-10-CM | POA: Diagnosis not present

## 2021-10-17 DIAGNOSIS — E876 Hypokalemia: Secondary | ICD-10-CM

## 2021-10-17 DIAGNOSIS — I10 Essential (primary) hypertension: Secondary | ICD-10-CM

## 2021-10-17 DIAGNOSIS — I5032 Chronic diastolic (congestive) heart failure: Secondary | ICD-10-CM

## 2021-10-17 MED ORDER — FLUTICASONE-SALMETEROL 115-21 MCG/ACT IN AERO: 2.0000 | INHALATION_SPRAY | Freq: Two times a day (BID) | RESPIRATORY_TRACT | 2 refills | Status: DC

## 2021-10-17 NOTE — Assessment & Plan Note (Signed)
bp at goal. Cont coreg 6.25 mg bid, valsartan 320 mg

## 2021-10-17 NOTE — Assessment & Plan Note (Signed)
In setting of n/v. Encouraged regular supplement. Will repeat today and adjust as needed.

## 2021-10-17 NOTE — Assessment & Plan Note (Signed)
Follows with cardiology at [facility] clinic. Reviewed last ECHO 2022 with EF 50%. Recent hospitalization in setting of increased lasix for 2 months instead of the planned 5 days. Cont lasix 40 mg and potassium daily. No edema, slight crackles as lung bases.

## 2021-10-17 NOTE — Patient Instructions (Addendum)
#   Heart failure - Continue to take Lasix 40 mg once daily - Continue to take potassium supplement - Labs today - call cardiology for follow-up appointment  #Vaginal discharge - will send to look for cause  #COPD - Try Advair daily for 2-4 weeks - Take this daily as prescribed - can also try Albuterol as needed for shortness of breath if needed

## 2021-10-17 NOTE — Progress Notes (Signed)
Subjective:     Morgan [name] is a 70 y.o. female presenting for Hospitalization Follow-up     HPI  #Hypokalemia - went to the ER  - still taking potassium and lasix - has some good days and some bad days - breathing is not good - sometimes sob with short distances - and will use O2 off and on - no wheezing with sob  #Vaginal discharge - x 1 month - clear - needing a panty liner - did have some urinary symptoms last week which resolved   Wondering about multiple myeloma - has researched this and wonders if this could be the cause  Review of Systems  10/20-10/22/2022: Hospitalization - stopped taking potassium while on lasix - K 2.4 mg on admission. decreased lasix to 40 mg once daily and take potassium daily  Social History   Tobacco Use  Smoking Status Former   Packs/day: 1.00   Years: 15.00   Pack years: 15.00   Types: Cigarettes   Quit date: 09/12/2017   Years since quitting: 4.0  Smokeless Tobacco Never        Objective:    BP Readings from Last 3 Encounters:  10/17/21 132/76  10/12/21 (!) 144/94  10/07/21 (!) 154/81   Wt Readings from Last 3 Encounters:  10/17/21 228 lb (103.4 kg)  10/06/21 235 lb 0.2 oz (106.6 kg)  09/20/21 235 lb 1.6 oz (106.6 kg)    BP 132/76   Pulse 83   Temp (!) 97.1 F (36.2 C)   Ht 6' 0.99" (1.854 m)   Wt 228 lb (103.4 kg)   SpO2 92%   BMI 30.09 kg/m    Physical Exam Constitutional:      General: She is not in acute distress.    Appearance: She is well-developed. She is not diaphoretic.  HENT:     Right Ear: External ear normal.     Left Ear: External ear normal.  Eyes:     Conjunctiva/sclera: Conjunctivae normal.  Cardiovascular:     Rate and Rhythm: Normal rate and regular rhythm.     Heart sounds: No murmur heard. Pulmonary:     Effort: Pulmonary effort is normal.     Breath sounds: Wheezing and rales present.  Musculoskeletal:     Cervical back: Neck supple.     Right lower leg: No edema.     Left  lower leg: No edema.  Skin:    General: Skin is warm and dry.     Capillary Refill: Capillary refill takes less than 2 seconds.  Neurological:     Mental Status: She is alert. Mental status is at baseline.  Psychiatric:        Mood and Affect: Mood normal.        Behavior: Behavior normal.          Assessment & Plan:   Problem List Items Addressed This Visit       Cardiovascular and Mediastinum   Essential hypertension    bp at goal. Cont coreg 6.25 mg bid, valsartan 320 mg       CHF (congestive heart failure) ([place]) - Primary    Follows with cardiology at [facility]. Reviewed last ECHO 2022 with EF 50%. Recent hospitalization in setting of increased lasix for 2 months instead of the planned 5 days. Cont lasix 40 mg and potassium daily. No edema, slight crackles as lung bases.       Relevant Orders   Basic metabolic panel   Magnesium  Respiratory   COPD (chronic obstructive pulmonary disease) (HCC)    Continues to have intermittent SOB and responds to O2. No wheezing/cough. Discussed trial of advair as appears euvolemic to see if symptoms improve. Cont prn albuterol      Relevant Medications   fluticasone-salmeterol (ADVAIR HFA) 115-21 MCG/ACT inhaler     Other   Hypokalemia    In setting of n/v. Encouraged regular supplement. Will repeat today and adjust as needed.       Other Visit Diagnoses     Vaginal discharge       Relevant Orders   WET PREP BY MOLECULAR PROBE      Vaginal discharge - plan for wet prep today. Will treat based on results  Return in about 3 months (around 01/29/2022) for check-in .  [name], MD  This visit occurred during the SARS-CoV-2 public health emergency.  Safety protocols were in place, including screening questions prior to the visit, additional usage of staff PPE, and extensive cleaning of exam room while observing appropriate contact time as indicated for disinfecting solutions.

## 2021-10-17 NOTE — Assessment & Plan Note (Signed)
Continues to have intermittent SOB and responds to O2. No wheezing/cough. Discussed trial of advair as appears euvolemic to see if symptoms improve. Cont prn albuterol

## 2021-10-18 ENCOUNTER — Other Ambulatory Visit: Payer: Self-pay | Admitting: Family Medicine

## 2021-10-18 ENCOUNTER — Ambulatory Visit (INDEPENDENT_AMBULATORY_CARE_PROVIDER_SITE_OTHER): Payer: Medicare HMO | Admitting: Pharmacist

## 2021-10-18 DIAGNOSIS — I5032 Chronic diastolic (congestive) heart failure: Secondary | ICD-10-CM

## 2021-10-18 DIAGNOSIS — N1831 Chronic kidney disease, stage 3a: Secondary | ICD-10-CM

## 2021-10-18 DIAGNOSIS — B9689 Other specified bacterial agents as the cause of diseases classified elsewhere: Secondary | ICD-10-CM

## 2021-10-18 DIAGNOSIS — E782 Mixed hyperlipidemia: Secondary | ICD-10-CM

## 2021-10-18 DIAGNOSIS — J449 Chronic obstructive pulmonary disease, unspecified: Secondary | ICD-10-CM

## 2021-10-18 DIAGNOSIS — I10 Essential (primary) hypertension: Secondary | ICD-10-CM

## 2021-10-18 LAB — BASIC METABOLIC PANEL
BUN: 23 mg/dL (ref 6–23)
CO2: 31 mEq/L (ref 19–32)
Calcium: 9.4 mg/dL (ref 8.4–10.5)
Chloride: 101 mEq/L (ref 96–112)
Creatinine, Ser: 1.35 mg/dL — ABNORMAL HIGH (ref 0.40–1.20)
GFR: 40.43 mL/min — ABNORMAL LOW (ref >60.00)
Glucose, Bld: 99 mg/dL (ref 70–99)
Potassium: 4.6 mEq/L (ref 3.5–5.1)
Sodium: 141 mEq/L (ref 135–145)

## 2021-10-18 LAB — WET PREP BY MOLECULAR PROBE
Candida species: NOT DETECTED
MICRO NUMBER:: 12577949
SPECIMEN QUALITY:: ADEQUATE
Trichomonas vaginosis: NOT DETECTED

## 2021-10-18 LAB — MAGNESIUM: Magnesium: 2.1 mg/dL (ref 1.5–2.5)

## 2021-10-18 MED ORDER — METRONIDAZOLE 500 MG PO TABS: 500.0000 mg | ORAL_TABLET | Freq: Two times a day (BID) | ORAL | 0 refills | Status: AC

## 2021-10-18 NOTE — Progress Notes (Signed)
Chronic Care Management Pharmacy Note  10/20/2021 Name:  Morgan [name] MRN:  [id number] DOB:  Jun 21, 1951  Summary: -Pt reports compliance with furosemide 40 mg and potassium 20 mEq once daily (before breakfast); she reports she is drinking fluids regularly; she denies s/sx of dehydration -Pt reports she has restarted atorvastatin, carvedilol and aspirin as advised last CCM visit -Pt reports she gets "jittery" when she takes albuterol and asked if Advair would cause this as well; Advair contains a LABA so it can potentially cause a similar side effect  Recommendations/Changes made from today's visit: -Recommend repeat lipid panel at next PCP or cardiology visit -Advised pt to take Advair twice daily and to contact PCP/PharmD if side effects occur; consider LAMA monotherapy (Spiriva, Incruse) if LABA is not tolerated   Subjective: Morgan [name] is an 70 y.o. year old female who is a primary patient of Cody, Jobe Marker, MD.  The CCM team was consulted for assistance with disease management and care coordination needs.    Engaged with patient by telephone for follow up visit in response to provider referral for pharmacy case management and/or care coordination services.   Consent to Services:  The patient was given information about Chronic Care Management services, agreed to services, and gave verbal consent prior to initiation of services.  Please see initial visit note for detailed documentation.   Patient Care Team: [name], MD as PCP - General (Family Medicine) Charlton Haws, [facility] as Pharmacist (Pharmacist)  Recent office visits: 10/17/21 Dr Einar Pheasant OV: hospital f/u; start Advair HFA 115-21 BID  08/28/21 [name], MD- PCP: chronic f/u; CHF exacerbation - increase lasix to 40 mg BID x 5 days, start Kcl 20 meq w/ lasix. C/o pyloric stenosis, advised to contact GI, hold starting alendronate until sx resolve. Low HGB - referred to hematology.   06/26/21 [name],  MD- PCP: acute visit s/p fall, back pain. Fell ~6/27. Rx'd oxycodone for pain. Referral to ortho for severe pain response.  Recent consult visits: 09/20/21 Urgent Care: sinusitis - rx'd Augmentin, prednisone 20 mg. Low suspicion COPD exacerbation.  09/12/21 Urgent care: c/o sore throat. Dx viral URI. Covid, strep negative.  Meds: cetirizine 10 mg daily, and prednisone 30 mg daily   09/11/21 [name], MD (Heme/onc): new pt - eval IDA. RTC 2 weeks, +/- Venofer. (Avoid oral iron d/t chronic constipation)   09/06/21  Locklear, [name], MD (GI [name]): f/u chronic nausea, dysphagia. Ordered barium swallow, NM gastric emptying study.  07/31/21 [name], MD (ortho - [name]): f/u T10 compression fx.  07/19/21 [name], MD  (ortho [name]): f/u T10 compression fx. Schedule kyphoplasty tomorrow and vertebroplasty soon.  07/05/21 [name], PA (Ortho [name]): initial visit for compression fracture.  06/12/21 Urgent Care - Pneumonia, rib pain. Rx'd Augmentin, Doxycycline, ibuprofen, tizanidine.  05/11/21 Flossie Dibble, MD (cardiology - [name]): f/u HTN, aortic aneurysm. Increase valsartan to 320 mg daily.   Hospital visits: Medication Reconciliation was completed by comparing discharge summary, patient's EMR and Pharmacy list, and upon discussion with patient.  Admitted to the hospital on 10/05/21 due to Hypokalemia. Discharge date was 10/07/21. Discharged from [facility].   -pt was not taking potassium due to not wanting to take it; also had n/v -overdiuresis (recently increased lasix to 40 mg BID)  Medication Changes at Hospital Discharge: -Changed Lasix to 40 mg daily w/ Kcl 20 mEq daily  Medications that remain the same  after Hospital Discharge:??  -All other medications will remain the same.    Admitted to the hospital on 08/23/21 due to T9 Kyphoplasty. Discharge date was 08/23/21. Discharged from [facility]     Medications that remain the same after Hospital Discharge:??  -All other medications will remain the same.   Admitted to the hospital on 07/20/21 due to T10 kyphoplasty. Discharge date was 07/20/21. Discharged from [facility].    Medications Discontinued at Hospital Discharge: - abilfy, metamucil, potassium, sucralfate and tizanidine  Medications that remain the same after Hospital Discharge:??  -All other medications will remain the same.   Admitted to the ED on 07/01/21 due to fall. Discharge date was 07/01/21. Discharged from [facility] and CT negative for acute abnormalities  Medications that remain the same after Hospital Discharge:??  -All other medications will remain the same.   Admitted to the ED on 03/26/21 due to Knee sprain. Discharge date was 03/26/21. Discharged from [facility] ED   Medications Started at Hospital Discharge: - oxycodone 5 mg   Objective:  Lab Results  Component Value Date   CREATININE 1.35 (H) 10/17/2021   BUN 23 10/17/2021   GFR 40.43 (L) 10/17/2021   GFRNONAA >60 10/07/2021   GFRAA >60 09/02/2020   NA 141 10/17/2021   K 4.6 10/17/2021   CALCIUM 9.4 10/17/2021   CO2 31 10/17/2021   GLUCOSE 99 10/17/2021    Lab Results  Component Value Date/Time   HGBA1C 5.8 (H) 10/05/2017 03:50 AM   GFR 40.43 (L) 10/17/2021 02:48 PM   GFR 57.33 (L) 08/29/2021 09:31 AM    Last diabetic Eye exam: No results found for: HMDIABEYEEXA  Last diabetic Foot exam: No results found for: HMDIABFOOTEX   Lab Results  Component Value Date   CHOL 137 12/26/2018   HDL 49 12/26/2018   LDLCALC 69 12/26/2018   TRIG 96 12/26/2018   CHOLHDL 4.3 10/05/2017    Hepatic Function Latest Ref Rng & Units 10/06/2021 10/05/2021 08/29/2021  Total Protein 6.5 - 8.1 g/dL 6.0(L) 6.6 6.5  Albumin 3.5 - 5.0 g/dL 2.8(L) 3.0(L) 3.5  AST 15 - 41 U/L 16 23 15   ALT 0 - 44 U/L 10 14 9   Alk Phosphatase 38 - 126  U/L 98 109 152(H)  Total Bilirubin 0.3 - 1.2 mg/dL 0.6 0.8 0.5  Bilirubin, Direct 0.0 - 0.2 mg/dL - - -    Lab Results  Component Value Date/Time   TSH 2.46 08/29/2021 09:31 AM   TSH 4.591 (H) 08/10/2020 05:49 AM   TSH 1.89 12/26/2018 12:00 AM   FREET4 1.01 08/29/2021 09:31 AM    CBC Latest Ref Rng & Units 10/06/2021 10/05/2021 09/11/2021  WBC 4.0 - 10.5 K/uL 6.7 10.1 6.1  Hemoglobin 12.0 - 15.0 g/dL 9.2(L) 10.3(L) 10.5(L)  Hematocrit 36.0 - 46.0 % 28.7(L) 33.1(L) 35.5(L)  Platelets 150 - 400 K/uL 259 289 344   Iron/TIBC/Ferritin/ %Sat    Component Value Date/Time   IRON 24 (L) 09/11/2021 1113   TIBC 351 09/11/2021 1113   FERRITIN 42 09/11/2021 1113   IRONPCTSAT 7 (L) 09/11/2021 1113    Lab Results  Component Value Date/Time   VD25OH 38.85 08/29/2021 09:31 AM    Clinical ASCVD: Yes  The ASCVD Risk score (Arnett DK, et al., 2019) failed to calculate for the following reasons:   The patient has a prior MI or stroke diagnosis    Depression screen [facility] 2/9 10/17/2021 08/28/2021  11/12/2018  Decreased Interest 0 1 0  Down, Depressed, Hopeless 0 1 0  PHQ - 2 Score 0 2 0  Altered sleeping - 1 -  Tired, decreased energy - 1 -  Change in appetite - 1 -  Feeling bad or failure about yourself  - 1 -  Trouble concentrating - 1 -  Moving slowly or fidgety/restless - 1 -  Suicidal thoughts - 0 -  PHQ-9 Score - 8 -  Difficult doing work/chores - Somewhat difficult -  Some recent data might be hidden     Social History   Tobacco Use  Smoking Status Former   Packs/day: 1.00   Years: 15.00   Pack years: 15.00   Types: Cigarettes   Quit date: 09/12/2017   Years since quitting: 4.1  Smokeless Tobacco Never   BP Readings from Last 3 Encounters:  10/17/21 132/76  10/12/21 (!) 144/94  10/07/21 (!) 154/81   Pulse Readings from Last 3 Encounters:  10/17/21 83  10/12/21 76  10/07/21 66   Wt Readings from Last 3 Encounters:  10/17/21 228 lb (103.4 kg)  10/06/21 235 lb 0.2  oz (106.6 kg)  09/20/21 235 lb 1.6 oz (106.6 kg)   BMI Readings from Last 3 Encounters:  10/17/21 30.09 kg/m  10/06/21 31.01 kg/m  09/20/21 31.02 kg/m    Assessment/Interventions: Review of patient past medical history, allergies, medications, health status, including review of consultants reports, laboratory and other test data, was performed as part of comprehensive evaluation and provision of chronic care management services.   SDOH:  (Social Determinants of Health) assessments and interventions performed: Yes  SDOH Screenings   Alcohol Screen: Not on file  Depression (PHQ2-9): Low Risk    PHQ-2 Score: 0  Financial Resource Strain: Not on file  Food Insecurity: Not on file  Housing: Not on file  Physical Activity: Not on file  Social Connections: Not on file  Stress: Not on file  Tobacco Use: Medium Risk   Smoking Tobacco Use: Former   Smokeless Tobacco Use: Never   Passive Exposure: Not on file  Transportation Needs: Not on file    [place]  Allergies  Allergen Reactions   Meperidine Hives and Anaphylaxis   Strawberry Extract Hives and Swelling   Carafate [Sucralfate] Nausea Only and Other (See Comments)    Severe nausea   Wasp Venom Hives    Medications Reviewed Today     Reviewed by Baron Hamper, CMA (Certified Medical Assistant) on 10/17/21 at 1417  Med List Status: <None>   Medication Order Taking? Sig Documenting Provider Last Dose Status Informant  albuterol (VENTOLIN HFA) 108 (90 Base) MCG/ACT inhaler [id number] Yes Inhale 1-2 puffs into the lungs every 6 (six) hours as needed for wheezing or shortness of breath. Ok Edwards, PA-C Taking Active Self  alendronate (FOSAMAX) 70 MG tablet [id number] Yes Take 1 tablet (70 mg total) by mouth every 7 (seven) days. Take with a full glass of water on an empty stomach. [name], MD Taking Active            Med Note Brunilda Payor   Fri Oct 06, 2021  9:36 AM) LF 08-28-21 84days  aspirin 81 MG EC  tablet [id number] Yes Take 1 tablet (81 mg total) by mouth daily. Swallow whole. [name], MD Taking Active            Med Note [phone], Hardin Negus   Fri Oct 06, 2021  9:37 AM)  LF 09-27-21 90days  aspirin-acetaminophen-caffeine (EXCEDRIN MIGRAINE) 250-250-65 MG tablet [id number] Yes Take 1-2 tablets by mouth every 6 (six) hours as needed for headache. [provider] Taking Active   atorvastatin (LIPITOR) 40 MG tablet [id number] Yes Take 1 tablet (40 mg total) by mouth daily. [name], MD Taking Active            Med Note [phone], Hardin Negus   Fri Oct 06, 2021  9:37 AM) LF 09-27-21 90days  carvedilol (COREG) 6.25 MG tablet [id number] Yes Take 1 tablet (6.25 mg total) by mouth 2 (two) times daily with a meal. [name], MD Taking Active            Med Note Brunilda Payor   Fri Oct 06, 2021  9:38 AM) LF 09-30-21 90days  cetirizine (ZYRTEC ALLERGY) 10 MG tablet [id number] Yes Take 1 tablet (10 mg total) by mouth daily. Jaynee Eagles, PA-C Taking Active   FLUoxetine (PROZAC) 40 MG capsule [id number] Yes Take 40 mg by mouth daily. [provider] Taking Active Self           Med Note Nicole Kindred, Hardin Negus   Fri Oct 06, 2021  9:38 AM) LF 09-15-21 30days  furosemide (LASIX) 40 MG tablet [id number] Yes Take 1 tablet (40 mg total) by mouth daily. Sidney Ace, MD Taking Active   gabapentin (NEURONTIN) 400 MG capsule [id number] Yes Take 400 mg by mouth 3 (three) times daily as needed (for nerve pain). [provider] Taking Active Self           Med Note Nicole Kindred, Hardin Negus   Fri Oct 06, 2021  9:39 AM) LF 09-15-21 30days  hydrOXYzine (ATARAX/VISTARIL) 25 MG tablet [id number] Yes Take 25 mg by mouth every 8 (eight) hours as needed for anxiety. [provider] Taking Active Self           Med Note Nicole Kindred, Hardin Negus   Fri Oct 06, 2021  9:39 AM) LF 09-15-21 30days  ibuprofen (ADVIL) 600 MG tablet [id number] Yes Take 1 tablet (600 mg total) by mouth every 6 (six) hours as  needed. Melynda Ripple, MD Taking Active   meloxicam [facility]) 7.5 MG tablet [id number] Yes Take 7.5 mg by mouth daily. [provider] Taking Active            Med Note Nicole Kindred, Hardin Negus   Fri Oct 06, 2021  9:43 AM) LF 09-18-21 30days  mirtazapine (REMERON) 15 MG tablet [id number] Yes Take 15 mg by mouth at bedtime. [provider] Taking Active Self           Med Note Nicole Kindred, Hardin Negus   Fri Oct 06, 2021  9:40 AM) LF 10-04-21 30days  omeprazole (PRILOSEC) 40 MG capsule [id number] Yes TAKE 1 CAPSULE BY MOUTH EVERY DAY  Patient taking differently: Take 80 mg by mouth daily.   [name], MD Taking Active            Med Note [phone], Hardin Negus   Fri Oct 06, 2021  9:40 AM) LF 08-14-21 90days  ondansetron (ZOFRAN-ODT) 4 MG disintegrating tablet [id number] Yes Take 1-2 tablets by mouth every 6 hours as needed Levin Erp, [place] Taking Active Self  OXYGEN [id number] Yes Inhale 2 L/min into the lungs See admin instructions. 2 L/min at bedtime and as needed for shortness of breath throughout the day [provider] Taking Active Self  polyethylene glycol-electrolytes (NULYTELY) 420 g solution [id number] Yes Take 4,000 mLs by  mouth as directed. [provider] Taking Active            Med Note Nicole Kindred, Hardin Negus   Fri Oct 06, 2021  9:43 AM) LF 09-21-21   potassium chloride SA (KLOR-CON) 20 MEQ tablet [id number] Yes Take 1 tablet (20 mEq total) by mouth daily. With furosemide Sidney Ace, MD Taking Active   predniSONE (DELTASONE) 20 MG tablet [id number] Yes Take 2 tablets daily with breakfast. Jaynee Eagles, PA-C Taking Active   valsartan (DIOVAN) 320 MG tablet [id number] Yes Take 320 mg by mouth daily. [provider] Taking Active Self           Med Note Brunilda Payor   Fri Oct 06, 2021  9:41 AM) LF 08-14-21 90days            Patient Active Problem List   Diagnosis Date Noted   Microcytic anemia 09/11/2021   Stage 3a chronic kidney disease  ([place]) 09/11/2021   IDA (iron deficiency anemia) 09/11/2021   Elevated TSH 08/28/2021   Osteopenia 08/28/2021   DOE (dyspnea on exertion) 08/28/2021   Acute midline thoracic back pain 06/26/2021   Other chronic pain 06/26/2021   Chest pressure 02/20/2021   Left sided abdominal pain 02/01/2021   Chronic diastolic CHF (congestive heart failure) ([place]) 08/09/2020   COPD (chronic obstructive pulmonary disease) ([place]) 08/09/2020   Chronic respiratory failure with hypoxia ([place]) 08/09/2020   Polyp of sigmoid colon    Polyp of cecum    Gastritis and gastroduodenitis    Pylorus ulcer    Pyloric stenosis in adult    Chronic cholecystitis 05/19/2020   Oxygen dependent 03/30/2020   Nausea and vomiting in adult 03/22/2020   Peptic ulcer disease 03/22/2020   Iron deficiency anemia 03/22/2020   Acute pain of right knee 12/29/2019   Acute left-sided low back pain without sciatica 12/29/2019   Chronic abdominal pain 02/19/2019   Asymptomatic bacteriuria 02/19/2019   CKD (chronic kidney disease) stage 3, GFR 30-59 ml/min (HCC) 01/13/2019   CHF (congestive heart failure) ([place]) 01/13/2019   Dizziness 01/13/2019   Hoarseness 11/12/2018   Dysphagia 11/12/2018   Sleepiness 11/12/2018   CVA (cerebral vascular accident) ([place]) 08/22/2018   Left-sided weakness 10/04/2017   HLD (hyperlipidemia) 10/04/2017   GERD (gastroesophageal reflux disease) 10/04/2017   Anxiety 08/26/2017   Essential hypertension    Depression    Diarrhea 02/22/2015   Hypokalemia 02/22/2015   Chronic lower back pain 04/29/2013   Paresthesia 04/29/2013    Immunization History  Administered Date(s) Administered   Fluad Quad(high Dose 65+) 09/30/2019, 08/29/2020, 08/28/2021   Influenza, High Dose Seasonal PF 08/23/2018   Influenza,inj,Quad PF,6+ Mos 02/24/2015   Moderna Sars-Covid-2 Vaccination 03/18/2020, 04/15/2020   Pneumococcal Conjugate-13 11/12/2018   Pneumococcal Polysaccharide-23 02/24/2015    Conditions to be  addressed/monitored:  Hypertension, Hyperlipidemia, Heart Failure, GERD, COPD, Chronic Kidney Disease, Depression, Anxiety, and Osteoporosis, hx CVA  Care Plan : [place]  Updates made by Charlton Haws, [place] since 10/20/2021 12:00 AM     Problem: Hypertension, Hyperlipidemia, Heart Failure, GERD, COPD, Chronic Kidney Disease, Depression, Anxiety, and Osteoporosis, hx CVA   Priority: High     Long-Range Goal: Disease management   Start Date: 09/22/2021  Expected End Date: 09/22/2022  This Visit's Progress: On track  Recent Progress: Not on track  Priority: High  Note:   Current Barriers:  Unable to independently monitor therapeutic efficacy Suboptimal therapeutic regimen for HTN/heart failure and HLD  Pharmacist  Clinical Goal(s):  Patient will achieve adherence to monitoring guidelines and medication adherence to achieve therapeutic efficacy adhere to plan to optimize therapeutic regimen for HTN/HF and HLD as evidenced by report of adherence to recommended medication management changes through collaboration with PharmD and provider.   Interventions: 1:1 collaboration with [name], MD regarding development and update of comprehensive plan of care as evidenced by provider attestation and co-signature Inter-disciplinary care team collaboration (see longitudinal plan of care) Comprehensive medication review performed; medication list updated in electronic medical record  Hyperlipidemia: (LDL goal < 70) -Not ideally controlled - pt was previously off of statin therapy for close to a year (pt forgot to refill); she has restarted atorvastatin and aspirin Oct 2022 -Hx CVA -Current treatment:  Atorvastatin 40 mg daily Aspirin 81 mg daily -Current exercise habits: none - pain and SOB -Educated on Cholesterol goals; Benefits of statin for ASCVD risk reduction; given history of CVA it is recommended she take a high-intensity statin and aspirin -Recommend to continue  current medication, repeat lipid panel with next PCP/cardiology visit  Heart Failure / Hypertension (Goal: BP < 140/90 and prevent exacerbations) -Improving - Pt reports compliance with medications as below; she takes furosemide and potassium together before breakfast; she reports she is drinking fluids regularly and denies s/sx of dehydration -Current home BP/HR readings: not checking -Last ejection fraction: 55-65% (Date: 08/23/2018) -HF type: Diastolic -Current treatment: Valsartan 320 mg daily Carvedilol 6.25 mg BID  Furosemide 40 mg daily Potassium chloride 20 meq daily w/ lasix -Medications previously tried: amlodipine, carvedilol, spironolactone, telmisartan, HCTZ -Educated on Benefits of medications for managing symptoms and prolonging life; Proper diuretic administration and potassium supplementation; Importance of blood pressure control -Counseled on limitations of office BP monitoring alone, benefits of home BP monitoring -Advised to check BP at home daily -Recommend to continue current medication  COPD (Goal: control symptoms and prevent exacerbations) -Not ideally controlled- pt was precribed Advair yesterday and has not started it yet; she reports she gets "jittery" with albuterol and wondered if that would happen with Advair as well -Current treatment  Albuterol HFA Advair HFA (fluticasone-salmeterol) 115-21 mcg/act 2 puff BID Cetirizine 10 mg daily Supplemental O2 (noctural) -Gold Grade: unknown (no PFTs on file) -Pulmonary function testing: not on file -Exacerbations requiring treatment in last 6 months: 1? -Frequency of rescue inhaler use: daily -Advised trial of Advair BID - if not tolerated, consider LAMA monotherapy (Spiriva, Incruse)  Depression/Anxiety (Goal: manage symtoms) -Controlled - pt reports mood is under control, current doses are fine -Current treatment: Fluoxetine 40 mg - 1 cap daily Hydroxyzine 25 mg q8h PRN - takes 1 daily Mirtazapine 15 mg  HS -Medications previously tried/failed: Abilify, duloxetine, hydroxyzine -PHQ9: 8 (08/2021) -GAD7: not on file -Recommended to continue current medication  Osteopenia (Goal prevent fractures) -Not ideally controlled - pt has been prescribed alendronate but she not started it yet due to GI issues -Last DEXA Scan: 08/10/21  T-Score total hip: -2.0  T-Score lumbar spine: -1.6  10-year probability of major osteoporotic fracture: 17.6%  10-year probability of hip fracture: 3.0% -Patient is a candidate for pharmacologic treatment due to history of vertebral fracture and T-Score -1.0 to -2.5 and 10-year risk of hip fracture > 3% -Current treatment  Alendronate 70 mg weekly (prescribed 08/2021, not started yet) -Recommend [phone] units of vitamin D daily. Recommend 1200 mg of calcium daily from dietary and supplemental sources. Counseled on oral bisphosphonate administration: take in the morning, 30 minutes prior to food with 6-8  oz of water. Do not lie down for at least 30 minutes after taking.  Back pain (Goal: manage pain) -Controlled -hx multiple falls, T10 compression fracture s/p kyphoplasty 07/20/21 -Current treatment: Gabapentin 400 mg TID prn Meloxicam 7.5 mg daily -Recommended to continue current medication  GERD / dysphagia / PUD (Goal: manage symptoms) -Controlled -hx pyloric stenosis. Follows w/ GI. -Current treatment  Omeprazole 40 mg daily - 2 daily Ondansetron 4 mg PRN -Chronic PPI use is not ideal given bone density issues; however pt has severe symptoms if she does not take PPI -Recommended to continue current medication  Chronic constipation (Goal: manage symptoms) -Controlled - pt is not taking Linzess consistently (she was using samples);  -Current treatment  Linzess 145 mcg daily PRN -Recommended to continue current medication; advised to contact GI for additional samples if needed  Health Maintenance -Vaccine gaps: TDAP, Shingrix, covid booster -Current therapy:   Excedrine Migraine (ASA-APAP-caffeine) -Patient is satisfied with current therapy and denies issues -Recommended to continue current medication  Patient Goals/Self-Care Activities Patient will:  - take medications as prescribed focus on medication adherence by pill box check blood pressure daily -Start Advair twice a day; contact PCP or PharmD if jiteriness occurs     Medication Assistance: None required.  Patient affirms current coverage meets needs.  Compliance/Adherence/Medication fill history: Care Gaps: Mammogram (07/16/19)  Star-Rating Drugs: Valsartan - LF 08/14/21 x 90 ds Atorvastatin - LF 09/27/21 x 90 ds  Patient's preferred pharmacy is:  [id number] [place] [address]Phone: [phone]: [address]Phone: [phone]: [phone] Uses pill box? No - prefers bottles Pt endorses 100% compliance  We discussed: Current pharmacy is preferred with insurance plan and patient is satisfied with pharmacy services Patient decided to: Continue current medication management strategy  Care Plan and Follow Up Patient Decision:  Patient agrees to Care Plan and Follow-up.  Plan: Telephone follow up appointment with care management team member scheduled for:  3 month  LCharlene Brooke PharmD, BGuidance Center, TheClinical Pharmacist LRiver[facility] LLCPrimary Care [phone]

## 2021-10-19 ENCOUNTER — Other Ambulatory Visit: Payer: Self-pay | Admitting: Family Medicine

## 2021-10-19 DIAGNOSIS — N1831 Chronic kidney disease, stage 3a: Secondary | ICD-10-CM

## 2021-10-19 DIAGNOSIS — I5032 Chronic diastolic (congestive) heart failure: Secondary | ICD-10-CM

## 2021-10-20 NOTE — Patient Instructions (Signed)
Visit Information  Phone number for Pharmacist: [phone]   Goals Addressed   None     Care Plan : [place]  Updates made by Charlton Haws, [facility] since 10/20/2021 12:00 AM     Problem: Hypertension, Hyperlipidemia, Heart Failure, GERD, COPD, Chronic Kidney Disease, Depression, Anxiety, and Osteoporosis, hx CVA   Priority: High     Long-Range Goal: Disease management   Start Date: 09/22/2021  Expected End Date: 09/22/2022  This Visit's Progress: On track  Recent Progress: Not on track  Priority: High  Note:   Current Barriers:  Unable to independently monitor therapeutic efficacy Suboptimal therapeutic regimen for HTN/heart failure and HLD  Pharmacist Clinical Goal(s):  Patient will achieve adherence to monitoring guidelines and medication adherence to achieve therapeutic efficacy adhere to plan to optimize therapeutic regimen for HTN/HF and HLD as evidenced by report of adherence to recommended medication management changes through collaboration with PharmD and provider.   Interventions: 1:1 collaboration with [name], MD regarding development and update of comprehensive plan of care as evidenced by provider attestation and co-signature Inter-disciplinary care team collaboration (see longitudinal plan of care) Comprehensive medication review performed; medication list updated in electronic medical record  Hyperlipidemia: (LDL goal < 70) -Not ideally controlled - pt was previously off of statin therapy for close to a year (pt forgot to refill); she has restarted atorvastatin and aspirin Oct 2022 -Hx CVA -Current treatment:  Atorvastatin 40 mg daily Aspirin 81 mg daily -Current exercise habits: none - pain and SOB -Educated on Cholesterol goals; Benefits of statin for ASCVD risk reduction; given history of CVA it is recommended she take a high-intensity statin and aspirin -Recommend to continue current medication, repeat lipid panel with next  PCP/cardiology visit  Heart Failure / Hypertension (Goal: BP < 140/90 and prevent exacerbations) -Improving - Pt reports compliance with medications as below; she takes furosemide and potassium together before breakfast; she reports she is drinking fluids regularly and denies s/sx of dehydration -Current home BP/HR readings: not checking -Last ejection fraction: 55-65% (Date: 08/23/2018) -HF type: Diastolic -Current treatment: Valsartan 320 mg daily Carvedilol 6.25 mg BID  Furosemide 40 mg daily Potassium chloride 20 meq daily w/ lasix -Medications previously tried: amlodipine, carvedilol, spironolactone, telmisartan, HCTZ -Educated on Benefits of medications for managing symptoms and prolonging life; Proper diuretic administration and potassium supplementation; Importance of blood pressure control -Counseled on limitations of office BP monitoring alone, benefits of home BP monitoring -Advised to check BP at home daily -Recommend to continue current medication  COPD (Goal: control symptoms and prevent exacerbations) -Not ideally controlled- pt was precribed Advair yesterday and has not started it yet; she reports she gets "jittery" with albuterol and wondered if that would happen with Advair as well -Current treatment  Albuterol HFA Advair HFA (fluticasone-salmeterol) 115-21 mcg/act 2 puff BID Cetirizine 10 mg daily Supplemental O2 (noctural) -Gold Grade: unknown (no PFTs on file) -Pulmonary function testing: not on file -Exacerbations requiring treatment in last 6 months: 1? -Frequency of rescue inhaler use: daily -Advised trial of Advair BID - if not tolerated, consider LAMA monotherapy (Spiriva, Incruse)  Depression/Anxiety (Goal: manage symtoms) -Controlled - pt reports mood is under control, current doses are fine -Current treatment: Fluoxetine 40 mg - 1 cap daily Hydroxyzine 25 mg q8h PRN - takes 1 daily Mirtazapine 15 mg HS -Medications previously tried/failed: Abilify,  duloxetine, hydroxyzine -PHQ9: 8 (08/2021) -GAD7: not on file -Recommended to continue current medication  Osteopenia (Goal prevent fractures) -Not ideally  controlled - pt has been prescribed alendronate but she not started it yet due to GI issues -Last DEXA Scan: 08/10/21  T-Score total hip: -2.0  T-Score lumbar spine: -1.6  10-year probability of major osteoporotic fracture: 17.6%  10-year probability of hip fracture: 3.0% -Patient is a candidate for pharmacologic treatment due to history of vertebral fracture and T-Score -1.0 to -2.5 and 10-year risk of hip fracture > 3% -Current treatment  Alendronate 70 mg weekly (prescribed 08/2021, not started yet) -Recommend [phone] units of vitamin D daily. Recommend 1200 mg of calcium daily from dietary and supplemental sources. Counseled on oral bisphosphonate administration: take in the morning, 30 minutes prior to food with 6-8 oz of water. Do not lie down for at least 30 minutes after taking.  Back pain (Goal: manage pain) -Controlled -hx multiple falls, T10 compression fracture s/p kyphoplasty 07/20/21 -Current treatment: Gabapentin 400 mg TID prn Meloxicam 7.5 mg daily -Recommended to continue current medication  GERD / dysphagia / PUD (Goal: manage symptoms) -Controlled -hx pyloric stenosis. Follows w/ GI. -Current treatment  Omeprazole 40 mg daily - 2 daily Ondansetron 4 mg PRN -Chronic PPI use is not ideal given bone density issues; however pt has severe symptoms if she does not take PPI -Recommended to continue current medication  Chronic constipation (Goal: manage symptoms) -Controlled - pt is not taking Linzess consistently (she was using samples);  -Current treatment  Linzess 145 mcg daily PRN -Recommended to continue current medication; advised to contact GI for additional samples if needed  Health Maintenance -Vaccine gaps: TDAP, Shingrix, covid booster -Current therapy:  Excedrine Migraine (ASA-APAP-caffeine) -Patient  is satisfied with current therapy and denies issues -Recommended to continue current medication  Patient Goals/Self-Care Activities Patient will:  - take medications as prescribed focus on medication adherence by pill box check blood pressure daily -Start Advair twice a day; contact PCP or PharmD if jitteriness occurs      The patient verbalized understanding of instructions, educational materials, and care plan provided today and declined offer to receive copy of patient instructions, educational materials, and care plan.  Telephone follow up appointment with pharmacy team member scheduled for: 3 months  Charlene Brooke, PharmD, [place], CPP Clinical Pharmacist [facility] [phone]

## 2021-10-25 DIAGNOSIS — I5033 Acute on chronic diastolic (congestive) heart failure: Secondary | ICD-10-CM | POA: Diagnosis not present

## 2021-10-25 DIAGNOSIS — J9611 Chronic respiratory failure with hypoxia: Secondary | ICD-10-CM | POA: Diagnosis not present

## 2021-10-25 DIAGNOSIS — N1832 Chronic kidney disease, stage 3b: Secondary | ICD-10-CM | POA: Diagnosis not present

## 2021-10-25 DIAGNOSIS — I7789 Other specified disorders of arteries and arterioles: Secondary | ICD-10-CM | POA: Diagnosis not present

## 2021-10-25 DIAGNOSIS — E782 Mixed hyperlipidemia: Secondary | ICD-10-CM | POA: Diagnosis not present

## 2021-10-25 DIAGNOSIS — I10 Essential (primary) hypertension: Secondary | ICD-10-CM | POA: Diagnosis not present

## 2021-10-25 DIAGNOSIS — J449 Chronic obstructive pulmonary disease, unspecified: Secondary | ICD-10-CM | POA: Diagnosis not present

## 2021-10-25 DIAGNOSIS — I509 Heart failure, unspecified: Secondary | ICD-10-CM | POA: Diagnosis not present

## 2021-10-27 ENCOUNTER — Encounter: Payer: Self-pay | Admitting: Internal Medicine

## 2021-10-27 ENCOUNTER — Other Ambulatory Visit: Payer: Self-pay

## 2021-10-27 ENCOUNTER — Ambulatory Visit (INDEPENDENT_AMBULATORY_CARE_PROVIDER_SITE_OTHER): Payer: Medicare HMO | Admitting: Internal Medicine

## 2021-10-27 VITALS — BP 118/78 | HR 76 | Temp 97.5°F | Ht 72.0 in | Wt 221.0 lb

## 2021-10-27 DIAGNOSIS — I959 Hypotension, unspecified: Secondary | ICD-10-CM | POA: Insufficient documentation

## 2021-10-27 DIAGNOSIS — R251 Tremor, unspecified: Secondary | ICD-10-CM | POA: Diagnosis not present

## 2021-10-27 DIAGNOSIS — I952 Hypotension due to drugs: Secondary | ICD-10-CM

## 2021-10-27 NOTE — Assessment & Plan Note (Signed)
BP Readings from Last 3 Encounters:  10/27/21 118/78  10/17/21 132/76  10/12/21 (!) 144/94   Repeat on right 124/68 Will have her stop the isosorbide Stay on lower dose carvedilol unless her BP rises at home Note to Dr Nehemiah Massed

## 2021-10-27 NOTE — Progress Notes (Signed)
Subjective:    Patient ID: Morgan [name], female    DOB: 03-16-1951, 70 y.o.   MRN: [id number]  HPI Here due to low blood pressure and dizziness This visit occurred during the SARS-CoV-2 public health emergency.  Safety protocols were in place, including screening questions prior to the visit, additional usage of staff PPE, and extensive cleaning of exam room while observing appropriate contact time as indicated for disinfecting solutions.   Recent visit with Dr Fabio Asa Doubled the carvedilol  Added isosorbide BP has been as high as 187/78 BP in office was only 116/78 though Her cuff read 103/63 this morning----despite holding isosorbide and going back to lower dose on the carvedilol Sedated---sleeping all day yesterday Unstable--had to pull out her rollator for walking  Has EGD and colon due next week---will hold the ASA for that  Gets chest pressure at times--even at rest Can occur with exertion--like walking with groceries Normal stress perfusion test in May  Ongoing tremor--affecting function Can't write now (left handed and shaking is mostly left hand)  Current Outpatient Medications on File Prior to Visit  Medication Sig Dispense Refill   albuterol (VENTOLIN HFA) 108 (90 Base) MCG/ACT inhaler Inhale 1-2 puffs into the lungs every 6 (six) hours as needed for wheezing or shortness of breath. 18 g 0   alendronate (FOSAMAX) 70 MG tablet Take 1 tablet (70 mg total) by mouth every 7 (seven) days. Take with a full glass of water on an empty stomach. 4 tablet 11   aspirin-acetaminophen-caffeine (EXCEDRIN MIGRAINE) 250-250-65 MG tablet Take 1-2 tablets by mouth every 6 (six) hours as needed for headache.     atorvastatin (LIPITOR) 40 MG tablet Take 1 tablet (40 mg total) by mouth daily. 90 tablet 1   cetirizine (ZYRTEC ALLERGY) 10 MG tablet Take 1 tablet (10 mg total) by mouth daily. 30 tablet 0   FLUoxetine (PROZAC) 40 MG capsule Take 40 mg by mouth daily.      fluticasone-salmeterol (ADVAIR HFA) 115-21 MCG/ACT inhaler Inhale 2 puffs into the lungs 2 (two) times daily. 1 each 2   furosemide (LASIX) 40 MG tablet Take 1 tablet (40 mg total) by mouth daily. 30 tablet 0   gabapentin (NEURONTIN) 400 MG capsule Take 400 mg by mouth 3 (three) times daily as needed (for nerve pain).     hydrOXYzine (ATARAX/VISTARIL) 25 MG tablet Take 25 mg by mouth every 8 (eight) hours as needed for anxiety.     ibuprofen (ADVIL) 600 MG tablet Take 1 tablet (600 mg total) by mouth every 6 (six) hours as needed. 30 tablet 0   meloxicam (MOBIC) 7.5 MG tablet Take 7.5 mg by mouth daily.     mirtazapine (REMERON) 15 MG tablet Take 15 mg by mouth at bedtime.     omeprazole (PRILOSEC) 40 MG capsule TAKE 1 CAPSULE BY MOUTH EVERY DAY (Patient taking differently: Take 80 mg by mouth daily.) 90 capsule 3   ondansetron (ZOFRAN-ODT) 4 MG disintegrating tablet Take 1-2 tablets by mouth every 6 hours as needed 360 tablet 3   OXYGEN Inhale 2 L/min into the lungs See admin instructions. 2 L/min at bedtime and as needed for shortness of breath throughout the day     polyethylene glycol-electrolytes (NULYTELY) 420 g solution Take 4,000 mLs by mouth as directed.     potassium chloride SA (KLOR-CON) 20 MEQ tablet Take 1 tablet (20 mEq total) by mouth daily. With furosemide 30 tablet 0   predniSONE (DELTASONE) 20 MG tablet Take 2  tablets daily with breakfast. 10 tablet 0   valsartan (DIOVAN) 320 MG tablet Take 320 mg by mouth daily.     carvedilol (COREG) 12.5 MG tablet Take 12.5 mg by mouth 2 (two) times daily. (Patient not taking: Reported on 10/27/2021)     isosorbide mononitrate (IMDUR) 30 MG 24 hr tablet Take 30 mg by mouth daily. (Patient not taking: Reported on 10/27/2021)     No current facility-administered medications on file prior to visit.    Allergies  Allergen Reactions   Meperidine Hives and Anaphylaxis   Strawberry Extract Hives and Swelling   Carafate [Sucralfate] Nausea Only  and Other (See Comments)    Severe nausea   Wasp Venom Hives    Past Medical History:  Diagnosis Date   Allergy    Anemia    Anxiety    Arthritis    CHF (congestive heart failure) (HCC)    Depression    GERD (gastroesophageal reflux disease)    Hypertension    MVP (mitral valve prolapse)     Past Surgical History:  Procedure Laterality Date   ABDOMINAL HYSTERECTOMY     APPENDECTOMY     BALLOON DILATION N/A 04/21/2020   Procedure: BALLOON DILATION;  Surgeon: Milus Banister, MD;  Location: [facility];  Service: Endoscopy;  Laterality: N/A;  pyloric/ GI   BALLOON DILATION N/A 07/04/2020   Procedure: BALLOON DILATION;  Surgeon: Mauri Pole, MD;  Location: [facility];  Service: Endoscopy;  Laterality: N/A;   BIOPSY  04/21/2020   Procedure: BIOPSY;  Surgeon: Milus Banister, MD;  Location: [facility];  Service: Endoscopy;;   BIOPSY  07/04/2020   Procedure: BIOPSY;  Surgeon: Mauri Pole, MD;  Location: [facility];  Service: Endoscopy;;  EGD and COLON   BREAST SURGERY Right 1988   tumor removal   CHOLECYSTECTOMY N/A 05/18/2020   Procedure: LAPAROSCOPIC CHOLECYSTECTOMY;  Surgeon: Johnathan Hausen, MD;  Location: [facility];  Service: General;  Laterality: N/A;   COLONOSCOPY WITH PROPOFOL N/A 07/04/2020   Procedure: COLONOSCOPY WITH PROPOFOL;  Surgeon: Mauri Pole, MD;  Location: [facility];  Service: Endoscopy;  Laterality: N/A;   ESOPHAGEAL DILATION  08/11/2020   Procedure: PYLORIC DILATION;  Surgeon: Doran Stabler, MD;  Location: [facility];  Service: Gastroenterology;;   ESOPHAGOGASTRODUODENOSCOPY (EGD) WITH PROPOFOL N/A 04/21/2020   Procedure: ESOPHAGOGASTRODUODENOSCOPY (EGD) WITH PROPOFOL;  Surgeon: Milus Banister, MD;  [facility]: [facility];  Service: Endoscopy;  Laterality: N/A;   ESOPHAGOGASTRODUODENOSCOPY (EGD) WITH PROPOFOL N/A 07/04/2020   Procedure: ESOPHAGOGASTRODUODENOSCOPY (EGD) WITH PROPOFOL;  Surgeon: Mauri Pole, MD;  Location: [facility];  Service: Endoscopy;  Laterality: N/A;   ESOPHAGOGASTRODUODENOSCOPY (EGD) WITH PROPOFOL N/A 08/11/2020   Procedure: ESOPHAGOGASTRODUODENOSCOPY (EGD) WITH PROPOFOL;  Surgeon: Doran Stabler, MD;  Location: [facility];  Service: Gastroenterology;  Laterality: N/A;   KNEE SURGERY  2005   2 times left   KNEE SURGERY Right    KYPHOPLASTY N/A 07/20/2021   Procedure: T 10KYPHOPLASTY;  Surgeon: Hessie Knows, MD;  Location: [facility];  Service: Orthopedics;  Laterality: N/A;   KYPHOPLASTY N/A 08/23/2021   Procedure: T9 KYPHOPLASTY;  Surgeon: Hessie Knows, MD;  Location: [facility];  Service: Orthopedics;  Laterality: N/A;   POLYPECTOMY  07/04/2020   Procedure: POLYPECTOMY;  Surgeon: Mauri Pole, MD;  Location: [facility];  Service: Endoscopy;;   TUBAL LIGATION      Family History  Problem Relation Age of Onset   Breast cancer Mother 63  Ovarian cancer Mother 66   Brain cancer Mother    Hypertension Father    Hyperlipidemia Father    Heart disease Father    Colon cancer Father        diagnosed in his 26s   Breast cancer Maternal Grandmother 76   Diabetes Paternal Grandmother    Breast cancer Paternal Grandmother 70   Other Son        drug over dose   Colon cancer Maternal Grandfather        dx in his 29s   Liver disease Maternal Grandfather    Breast cancer Maternal Aunt 60   Breast cancer Maternal Aunt 60   Colon cancer Maternal Uncle    Stomach cancer Neg Hx    Pancreatic cancer Neg Hx    Esophageal cancer Neg Hx     Social History   Socioeconomic History   Marital status: Single    Spouse name: Not on file   Number of children: 2   Years of education: Not on file   Highest education level: Not on file  Occupational History   Not on file  Tobacco Use   Smoking status: Former    Packs/day: 1.00    Years: 15.00    Pack years: 15.00    Types: Cigarettes    Quit date: 09/12/2017    Years since quitting: 4.1   Smokeless tobacco: Never  Vaping Use    Vaping Use: Never used  Substance and Sexual Activity   Alcohol use: Not Currently   Drug use: No   Sexual activity: Not Currently  Other Topics Concern   Not on file  Social History Narrative   Lives with Grandson's great grandparents    61 - 64 years old ([place])   Daughter - [name] - lives in the [place] system - family, aunt, [name] and [name] (who she lives with), brother   Transportation: roommates   Enjoy: [employer], playing with grandson   Exercise: PT exercises   Diet: good - chicken, some steak, tries to eat healthy, avoids fried foods   Retired from being a Quarry manager   Social Determinants of Radio broadcast assistant Strain: Not on file  Food Insecurity: Not on file  Transportation Needs: Not on file  Physical Activity: Not on file  Stress: Not on file  Social Connections: Not on file  Intimate Partner Violence: Not on file   Review of Systems Eating okay Lost a couple of pounds though    Objective:   Physical Exam Constitutional:      Appearance: Normal appearance.  Cardiovascular:     Rate and Rhythm: Normal rate and regular rhythm.     Heart sounds: No murmur heard.   No gallop.  Pulmonary:     Effort: Pulmonary effort is normal.     Breath sounds: Normal breath sounds. No wheezing or rales.  Musculoskeletal:     Cervical back: Neck supple.     Right lower leg: No edema.     Left lower leg: No edema.  Lymphadenopathy:     Cervical: No cervical adenopathy.  Neurological:     Mental Status: She is alert.     Comments: Tremor in hand/arms---L>R Some bradykinesia--more marked on right Small steps with walking No sig increased tone           Assessment & Plan:

## 2021-10-27 NOTE — Assessment & Plan Note (Signed)
Symptoms highly suggestive of primary PD Will set up with Dr Tat

## 2021-10-29 ENCOUNTER — Encounter: Payer: Self-pay | Admitting: *Deleted

## 2021-10-29 ENCOUNTER — Other Ambulatory Visit: Payer: Self-pay

## 2021-10-29 ENCOUNTER — Ambulatory Visit
Admission: EM | Admit: 2021-10-29 | Discharge: 2021-10-29 | Disposition: A | Discharge status: Home / Self Care | Payer: Medicare HMO | Attending: Physician Assistant | Admitting: Physician Assistant

## 2021-10-29 DIAGNOSIS — N390 Urinary tract infection, site not specified: Secondary | ICD-10-CM

## 2021-10-29 DIAGNOSIS — N1831 Chronic kidney disease, stage 3a: Secondary | ICD-10-CM

## 2021-10-29 LAB — POCT URINALYSIS DIP (MANUAL ENTRY)
Bilirubin, UA: NEGATIVE
Blood, UA: NEGATIVE
Glucose, UA: NEGATIVE mg/dL
Ketones, POC UA: NEGATIVE mg/dL
Nitrite, UA: NEGATIVE
Protein Ur, POC: NEGATIVE mg/dL
Spec Grav, UA: 1.02 (ref 1.010–1.025)
Urobilinogen, UA: 0.2 E.U./dL
pH, UA: 6 (ref 5.0–8.0)

## 2021-10-29 MED ORDER — NITROFURANTOIN MONOHYD MACRO 100 MG PO CAPS: 100.0000 mg | ORAL_CAPSULE | Freq: Two times a day (BID) | ORAL | 0 refills | Status: DC

## 2021-10-29 NOTE — ED Provider Notes (Signed)
[name]    CSN: [id number] Arrival date & time: 10/29/21  0943      History   Chief Complaint Chief Complaint  Patient presents with   UTI    HPI Morgan [name] is a 70 y.o. female.   Patient here today for evaluation of urinary tract infection symptoms including dysuria and suprapubic pain.  She reports some low back pain as well.  She has had some nausea but no vomiting.  She has not had fever or chills.  She does not report any treatment for symptoms.  She does have stage III kidney disease.  The history is provided by the patient.   Past Medical History:  Diagnosis Date   Allergy    Anemia    Anxiety    Arthritis    CHF (congestive heart failure) (HCC)    Depression    GERD (gastroesophageal reflux disease)    Hypertension    MVP (mitral valve prolapse)     Patient Active Problem List   Diagnosis Date Noted   Hypotension 10/27/2021   Tremor 10/27/2021   Microcytic anemia 09/11/2021   Stage 3a chronic kidney disease ([place]) 09/11/2021   IDA (iron deficiency anemia) 09/11/2021   Elevated TSH 08/28/2021   Osteopenia 08/28/2021   DOE (dyspnea on exertion) 08/28/2021   Acute midline thoracic back pain 06/26/2021   Other chronic pain 06/26/2021   Chest pressure 02/20/2021   Left sided abdominal pain 02/01/2021   Chronic diastolic CHF (congestive heart failure) ([place]) 08/09/2020   COPD (chronic obstructive pulmonary disease) ([place]) 08/09/2020   Chronic respiratory failure with hypoxia ([place]) 08/09/2020   Polyp of sigmoid colon    Polyp of cecum    Gastritis and gastroduodenitis    Pylorus ulcer    Pyloric stenosis in adult    Chronic cholecystitis 05/19/2020   Oxygen dependent 03/30/2020   Nausea and vomiting in adult 03/22/2020   Peptic ulcer disease 03/22/2020   Iron deficiency anemia 03/22/2020   Acute pain of right knee 12/29/2019   Acute left-sided low back pain without sciatica 12/29/2019   Chronic abdominal pain 02/19/2019    Asymptomatic bacteriuria 02/19/2019   CKD (chronic kidney disease) stage 3, GFR 30-59 ml/min ([place]) 01/13/2019   CHF (congestive heart failure) ([place]) 01/13/2019   Dizziness 01/13/2019   Hoarseness 11/12/2018   Dysphagia 11/12/2018   Sleepiness 11/12/2018   CVA (cerebral vascular accident) ([place]) 08/22/2018   Left-sided weakness 10/04/2017   HLD (hyperlipidemia) 10/04/2017   GERD (gastroesophageal reflux disease) 10/04/2017   Anxiety 08/26/2017   Essential hypertension    Depression    Diarrhea 02/22/2015   Hypokalemia 02/22/2015   Chronic lower back pain 04/29/2013   Paresthesia 04/29/2013    Past Surgical History:  Procedure Laterality Date   ABDOMINAL HYSTERECTOMY     APPENDECTOMY     BALLOON DILATION N/A 04/21/2020   Procedure: BALLOON DILATION;  Surgeon: Milus Banister, MD;  Location: [facility];  Service: Endoscopy;  Laterality: N/A;  pyloric/ GI   BALLOON DILATION N/A 07/04/2020   Procedure: BALLOON DILATION;  Surgeon: Mauri Pole, MD;  Location: [facility];  Service: Endoscopy;  Laterality: N/A;   BIOPSY  04/21/2020   Procedure: BIOPSY;  Surgeon: Milus Banister, MD;  Location: [facility];  Service: Endoscopy;;   BIOPSY  07/04/2020   Procedure: BIOPSY;  Surgeon: Mauri Pole, MD;  Location: [facility];  Service: Endoscopy;;  EGD and COLON   BREAST SURGERY Right 1988   tumor removal  CHOLECYSTECTOMY N/A 05/18/2020   Procedure: LAPAROSCOPIC CHOLECYSTECTOMY;  Surgeon: Johnathan Hausen, MD;  Location: [facility];  Service: General;  Laterality: N/A;   COLONOSCOPY WITH PROPOFOL N/A 07/04/2020   Procedure: COLONOSCOPY WITH PROPOFOL;  Surgeon: Mauri Pole, MD;  Location: [facility];  Service: Endoscopy;  Laterality: N/A;   ESOPHAGEAL DILATION  08/11/2020   Procedure: PYLORIC DILATION;  Surgeon: Doran Stabler, MD;  Location: [facility];  Service: Gastroenterology;;   ESOPHAGOGASTRODUODENOSCOPY (EGD) WITH PROPOFOL N/A 04/21/2020   Procedure:  ESOPHAGOGASTRODUODENOSCOPY (EGD) WITH PROPOFOL;  Surgeon: Milus Banister, MD;  Location: [facility];  Service: Endoscopy;  Laterality: N/A;   ESOPHAGOGASTRODUODENOSCOPY (EGD) WITH PROPOFOL N/A 07/04/2020   Procedure: ESOPHAGOGASTRODUODENOSCOPY (EGD) WITH PROPOFOL;  Surgeon: Mauri Pole, MD;  Location: [facility];  Service: Endoscopy;  Laterality: N/A;   ESOPHAGOGASTRODUODENOSCOPY (EGD) WITH PROPOFOL N/A 08/11/2020   Procedure: ESOPHAGOGASTRODUODENOSCOPY (EGD) WITH PROPOFOL;  Surgeon: Doran Stabler, MD;  Location: [facility];  Service: Gastroenterology;  Laterality: N/A;   KNEE SURGERY  2005   2 times left   KNEE SURGERY Right    KYPHOPLASTY N/A 07/20/2021   Procedure: T 10KYPHOPLASTY;  Surgeon: Hessie Knows, MD;  Location: [facility];  Service: Orthopedics;  Laterality: N/A;   KYPHOPLASTY N/A 08/23/2021   Procedure: T9 KYPHOPLASTY;  Surgeon: Hessie Knows, MD;  Location: [facility];  Service: Orthopedics;  Laterality: N/A;   POLYPECTOMY  07/04/2020   Procedure: POLYPECTOMY;  Surgeon: Mauri Pole, MD;  Location: [facility];  Service: Endoscopy;;   TUBAL LIGATION      OB History   No obstetric history on file.      Home Medications    Prior to Admission medications   Medication Sig Start Date End Date Taking? Authorizing Provider  nitrofurantoin, macrocrystal-monohydrate, (MACROBID) 100 MG capsule Take 1 capsule (100 mg total) by mouth 2 (two) times daily. 10/29/21  Yes Francene Finders, PA-C  albuterol (VENTOLIN HFA) 108 (90 Base) MCG/ACT inhaler Inhale 1-2 puffs into the lungs every 6 (six) hours as needed for wheezing or shortness of breath. 10/31/20   Tasia Catchings, Amy V, PA-C  alendronate (FOSAMAX) 70 MG tablet Take 1 tablet (70 mg total) by mouth every 7 (seven) days. Take with a full glass of water on an empty stomach. 08/28/21   [name], MD  aspirin-acetaminophen-caffeine (EXCEDRIN MIGRAINE) [phone] MG tablet Take 1-2 tablets by mouth every 6 (six) hours as  needed for headache.    [provider]  atorvastatin (LIPITOR) 40 MG tablet Take 1 tablet (40 mg total) by mouth daily. 09/27/21   [name], MD  carvedilol (COREG) 12.5 MG tablet Take 12.5 mg by mouth 2 (two) times daily. Patient not taking: Reported on 10/27/2021 10/25/21   [provider]  cetirizine (ZYRTEC ALLERGY) 10 MG tablet Take 1 tablet (10 mg total) by mouth daily. 09/12/21   Jaynee Eagles, PA-C  FLUoxetine (PROZAC) 40 MG capsule Take 40 mg by mouth daily.    [provider]  fluticasone-salmeterol (ADVAIR HFA) 115-21 MCG/ACT inhaler Inhale 2 puffs into the lungs 2 (two) times daily. 10/17/21   [name], MD  furosemide (LASIX) 40 MG tablet Take 1 tablet (40 mg total) by mouth daily. 10/07/21   Sidney Ace, MD  gabapentin (NEURONTIN) 400 MG capsule Take 400 mg by mouth 3 (three) times daily as needed (for nerve pain). 10/20/18   [provider]  hydrOXYzine (ATARAX/VISTARIL) 25 MG tablet Take 25 mg by mouth every  8 (eight) hours as needed for anxiety. 08/22/21   [provider]  ibuprofen (ADVIL) 600 MG tablet Take 1 tablet (600 mg total) by mouth every 6 (six) hours as needed. 06/12/21   Melynda Ripple, MD  meloxicam (MOBIC) 7.5 MG tablet Take 7.5 mg by mouth daily. 09/18/21   [provider]  mirtazapine (REMERON) 15 MG tablet Take 15 mg by mouth at bedtime. 10/20/20   [provider]  omeprazole (PRILOSEC) 40 MG capsule TAKE 1 CAPSULE BY MOUTH EVERY DAY Patient taking differently: Take 80 mg by mouth daily. 08/14/21   [name], MD  ondansetron (ZOFRAN-ODT) 4 MG disintegrating tablet Take 1-2 tablets by mouth every 6 hours as needed 03/03/21   Levin Erp, PA  OXYGEN Inhale 2 L/min into the lungs See admin instructions. 2 L/min at bedtime and as needed for shortness of breath throughout the day    [provider]  polyethylene glycol-electrolytes (NULYTELY) 420 g solution Take 4,000 mLs  by mouth as directed. 09/21/21   [provider]  potassium chloride SA (KLOR-CON) 20 MEQ tablet Take 1 tablet (20 mEq total) by mouth daily. With furosemide 10/07/21 11/06/21  Sidney Ace, MD  predniSONE (DELTASONE) 20 MG tablet Take 2 tablets daily with breakfast. 09/20/21   Jaynee Eagles, PA-C  valsartan (DIOVAN) 320 MG tablet Take 320 mg by mouth daily. 05/11/21   [provider]    Family History Family History  Problem Relation Age of Onset   Breast cancer Mother 58   Ovarian cancer Mother 41   Brain cancer Mother    Hypertension Father    Hyperlipidemia Father    Heart disease Father    Colon cancer Father        diagnosed in his 77s   Breast cancer Maternal Grandmother 64   Diabetes Paternal Grandmother    Breast cancer Paternal Grandmother 66   Other Son        drug over dose   Colon cancer Maternal Grandfather        dx in his 43s   Liver disease Maternal Grandfather    Breast cancer Maternal Aunt 60   Breast cancer Maternal Aunt 60   Colon cancer Maternal Uncle    Stomach cancer Neg Hx    Pancreatic cancer Neg Hx    Esophageal cancer Neg Hx     Social History Social History   Tobacco Use   Smoking status: Former    Packs/day: 1.00    Years: 15.00    Pack years: 15.00    Types: Cigarettes    Quit date: 09/12/2017    Years since quitting: 4.1   Smokeless tobacco: Never  Vaping Use   Vaping Use: Never used  Substance Use Topics   Alcohol use: Not Currently   Drug use: No     Allergies   Meperidine, Strawberry extract, Carafate [sucralfate], and Wasp venom   Review of Systems Review of Systems  Constitutional:  Negative for chills and fever.  Respiratory:  Negative for shortness of breath.   Cardiovascular:  Negative for chest pain.  Gastrointestinal:  Positive for abdominal pain and nausea. Negative for vomiting.  Genitourinary:  Positive for dysuria and frequency.  Musculoskeletal:  Positive for back pain.    Physical  Exam Triage Vital Signs ED Triage Vitals  Enc Vitals Group     BP 10/29/21 1058 (!) 155/94     Pulse Rate 10/29/21 1058 66     Resp 10/29/21 1058  18     Temp 10/29/21 1058 97.9 F (36.6 C)     Temp src --      SpO2 10/29/21 1058 95 %     Weight --      Height --      Head Circumference --      Peak Flow --      Pain Score 10/29/21 1056 5     Pain Loc --      Pain Edu? --      Excl. in [place]? --    No data found.  Updated Vital Signs BP (!) 155/94   Pulse 66   Temp 97.9 F (36.6 C)   Resp 18   SpO2 95%      Physical Exam Vitals and nursing note reviewed.  Constitutional:      General: She is not in acute distress.    Appearance: Normal appearance. She is not ill-appearing.  HENT:     Head: Normocephalic and atraumatic.     Nose: Nose normal.  Cardiovascular:     Rate and Rhythm: Normal rate and regular rhythm.     Heart sounds: Normal heart sounds. No murmur heard. Pulmonary:     Effort: Pulmonary effort is normal. No respiratory distress.     Breath sounds: Normal breath sounds. No wheezing, rhonchi or rales.  Abdominal:     General: Abdomen is flat.     Tenderness: There is right CVA tenderness (mild). There is no left CVA tenderness.  Skin:    General: Skin is warm and dry.  Neurological:     Mental Status: She is alert.  Psychiatric:        Mood and Affect: Mood normal.        Thought Content: Thought content normal.     UC Treatments / Results  Labs (all labs ordered are listed, but only abnormal results are displayed) Labs Reviewed  POCT URINALYSIS DIP (MANUAL ENTRY) - Abnormal; Notable for the following components:      Result Value   Leukocytes, UA Moderate (2+) (*)    All other components within normal limits  URINE CULTURE    EKG   Radiology No results found.  Procedures Procedures (including critical care time)  Medications Ordered in UC Medications - No data to display  Initial Impression / Assessment and Plan / UC Course  I  have reviewed the triage vital signs and the nursing notes.  Pertinent labs & imaging results that were available during my care of the patient were reviewed by me and considered in my medical decision making (see chart for details).    Will treat to cover UTI and urine culture ordered. Unclear if CVA tenderness is related to kidney infection vs chronic low back pain. Encouraged follow up if symptoms fail to improve with treatment or worsen in any way.    Final Clinical Impressions(s) / UC Diagnoses   Final diagnoses:  Urinary tract infection without hematuria, site unspecified   Discharge Instructions   None    ED Prescriptions     Medication Sig Dispense Auth. Provider   nitrofurantoin, macrocrystal-monohydrate, (MACROBID) 100 MG capsule Take 1 capsule (100 mg total) by mouth 2 (two) times daily. 10 capsule Francene Finders, PA-C      PDMP not reviewed this encounter.   Francene Finders, PA-C 10/29/21 1125

## 2021-10-29 NOTE — ED Triage Notes (Signed)
Pt reports dysuria ,frequency ,back and Abd pain. Pt has stage 3 kidney DX.

## 2021-11-01 LAB — URINE CULTURE: Culture: <10000 — AB

## 2021-11-02 ENCOUNTER — Telehealth (HOSPITAL_COMMUNITY): Payer: Self-pay | Admitting: Emergency Medicine

## 2021-11-02 ENCOUNTER — Encounter: Payer: Self-pay | Admitting: *Deleted

## 2021-11-02 MED ORDER — FLUCONAZOLE 150 MG PO TABS: 150.0000 mg | ORAL_TABLET | Freq: Once | ORAL | 0 refills | Status: AC

## 2021-11-03 ENCOUNTER — Ambulatory Visit
Admission: RE | Admit: 2021-11-03 | Discharge: 2021-11-03 | Disposition: A | Discharge status: Home / Self Care | Payer: Medicare HMO | Attending: Gastroenterology | Admitting: Gastroenterology

## 2021-11-03 ENCOUNTER — Encounter
Admission: RE | Disposition: A | Discharge status: Home / Self Care | Payer: Self-pay | Source: Home / Self Care | Attending: Gastroenterology

## 2021-11-03 ENCOUNTER — Encounter: Payer: Self-pay | Admitting: *Deleted

## 2021-11-03 ENCOUNTER — Ambulatory Visit: Payer: Medicare HMO | Admitting: Anesthesiology

## 2021-11-03 DIAGNOSIS — K3189 Other diseases of stomach and duodenum: Secondary | ICD-10-CM | POA: Insufficient documentation

## 2021-11-03 DIAGNOSIS — J449 Chronic obstructive pulmonary disease, unspecified: Secondary | ICD-10-CM | POA: Diagnosis not present

## 2021-11-03 DIAGNOSIS — I110 Hypertensive heart disease with heart failure: Secondary | ICD-10-CM | POA: Diagnosis not present

## 2021-11-03 DIAGNOSIS — K529 Noninfective gastroenteritis and colitis, unspecified: Secondary | ICD-10-CM | POA: Diagnosis not present

## 2021-11-03 DIAGNOSIS — R1310 Dysphagia, unspecified: Secondary | ICD-10-CM | POA: Diagnosis not present

## 2021-11-03 DIAGNOSIS — I509 Heart failure, unspecified: Secondary | ICD-10-CM | POA: Diagnosis not present

## 2021-11-03 DIAGNOSIS — Z87891 Personal history of nicotine dependence: Secondary | ICD-10-CM | POA: Insufficient documentation

## 2021-11-03 DIAGNOSIS — D120 Benign neoplasm of cecum: Secondary | ICD-10-CM | POA: Insufficient documentation

## 2021-11-03 DIAGNOSIS — K635 Polyp of colon: Secondary | ICD-10-CM | POA: Diagnosis not present

## 2021-11-03 DIAGNOSIS — Z800 Family history of malignant neoplasm of digestive organs: Secondary | ICD-10-CM | POA: Diagnosis not present

## 2021-11-03 DIAGNOSIS — R197 Diarrhea, unspecified: Secondary | ICD-10-CM | POA: Diagnosis not present

## 2021-11-03 DIAGNOSIS — K640 First degree hemorrhoids: Secondary | ICD-10-CM | POA: Diagnosis not present

## 2021-11-03 DIAGNOSIS — R933 Abnormal findings on diagnostic imaging of other parts of digestive tract: Secondary | ICD-10-CM | POA: Diagnosis not present

## 2021-11-03 DIAGNOSIS — Z9981 Dependence on supplemental oxygen: Secondary | ICD-10-CM | POA: Insufficient documentation

## 2021-11-03 DIAGNOSIS — R69 Illness, unspecified: Secondary | ICD-10-CM | POA: Diagnosis not present

## 2021-11-03 DIAGNOSIS — K449 Diaphragmatic hernia without obstruction or gangrene: Secondary | ICD-10-CM | POA: Diagnosis not present

## 2021-11-03 DIAGNOSIS — K5730 Diverticulosis of large intestine without perforation or abscess without bleeding: Secondary | ICD-10-CM | POA: Diagnosis not present

## 2021-11-03 DIAGNOSIS — K649 Unspecified hemorrhoids: Secondary | ICD-10-CM | POA: Diagnosis not present

## 2021-11-03 DIAGNOSIS — R195 Other fecal abnormalities: Secondary | ICD-10-CM | POA: Diagnosis not present

## 2021-11-03 HISTORY — PX: COLONOSCOPY WITH PROPOFOL: SHX5780

## 2021-11-03 HISTORY — PX: ESOPHAGOGASTRODUODENOSCOPY (EGD) WITH PROPOFOL: SHX5813

## 2021-11-03 SURGERY — COLONOSCOPY WITH PROPOFOL
Anesthesia: General

## 2021-11-03 MED ORDER — SODIUM CHLORIDE 0.9 % IV SOLN: INTRAVENOUS | Status: DC

## 2021-11-03 MED ORDER — PROPOFOL 10 MG/ML IV BOLUS
INTRAVENOUS | Status: DC | PRN
  Administered 2021-11-03: 80 mg via INTRAVENOUS

## 2021-11-03 MED ORDER — PROPOFOL 500 MG/50ML IV EMUL
INTRAVENOUS | Status: AC
  Filled 2021-11-03: qty 50

## 2021-11-03 MED ORDER — PROPOFOL 500 MG/50ML IV EMUL
INTRAVENOUS | Status: AC
  Filled 2021-11-03: qty 100

## 2021-11-03 MED ORDER — PROPOFOL 500 MG/50ML IV EMUL
INTRAVENOUS | Status: DC | PRN
  Administered 2021-11-03: 130 ug/kg/min via INTRAVENOUS

## 2021-11-03 MED ORDER — LIDOCAINE HCL (CARDIAC) PF 100 MG/5ML IV SOSY
PREFILLED_SYRINGE | INTRAVENOUS | Status: DC | PRN
  Administered 2021-11-03: 50 mg via INTRAVENOUS

## 2021-11-03 NOTE — Interval H&P Note (Signed)
History and Physical Interval Note:  11/03/2021 1:09 PM  Morgan [name]  has presented today for surgery, with the diagnosis of Abnormal Barium swallow (R93.3) Elevated fecal calprotectin (R19.5).  The various methods of treatment have been discussed with the patient and family. After consideration of risks, benefits and other options for treatment, the patient has consented to  Procedure(s): COLONOSCOPY WITH PROPOFOL (N/A) ESOPHAGOGASTRODUODENOSCOPY (EGD) WITH PROPOFOL (N/A) as a surgical intervention.  The patient's history has been reviewed, patient examined, no change in status, stable for surgery.  I have reviewed the patient's chart and labs.  Questions were answered to the patient's satisfaction.     Lesly Rubenstein  Ok to proceed with EGD/Colonoscopy

## 2021-11-03 NOTE — Anesthesia Postprocedure Evaluation (Signed)
Anesthesia Post Note  Patient: Morgan [name]  Procedure(s) Performed: COLONOSCOPY WITH PROPOFOL ESOPHAGOGASTRODUODENOSCOPY (EGD) WITH PROPOFOL  Patient location during evaluation: Endoscopy Anesthesia Type: General Level of consciousness: awake and alert Pain management: pain level controlled Vital Signs Assessment: post-procedure vital signs reviewed and stable Respiratory status: spontaneous breathing, nonlabored ventilation, respiratory function stable and patient connected to nasal cannula oxygen Cardiovascular status: blood pressure returned to baseline and stable Postop Assessment: no apparent nausea or vomiting Anesthetic complications: no   No notable events documented.   Last Vitals:  Vitals:   11/03/21 1239 11/03/21 1400  BP: (!) 168/106 127/66  Pulse: 92 70  Resp: 20 13  Temp: (!) 36.1 C (!) 36.1 C  SpO2: 98% 100%    Last Pain:  Vitals:   11/03/21 1400  TempSrc: Temporal  PainSc: Asleep                 Margaree Mackintosh

## 2021-11-03 NOTE — Op Note (Signed)
[facility] Patient Name: Morgan [name] Procedure Date: 11/03/2021 1:17 PM MRN: [id number] Account #: [id number] Date of Birth: [birth year] Admit Type: Outpatient Age: 70 Room: [facility] ENDO ROOM 3 Gender: Female Note Status: Finalized Instrument Name: Michaelle Birks [id number] Procedure:             Upper GI endoscopy Indications:           Abnormal UGI series Providers:             Andrey Farmer MD, MD Referring MD:          Jobe Marker. Einar Pheasant (Referring MD) Medicines:             Monitored Anesthesia Care Complications:         No immediate complications. Estimated blood loss:                         Minimal. Procedure:             Pre-Anesthesia Assessment:                        - Prior to the procedure, a History and Physical was                         performed, and patient medications and allergies were                         reviewed. The patient is competent. The risks and                         benefits of the procedure and the sedation options and                         risks were discussed with the patient. All questions                         were answered and informed consent was obtained.                         Patient identification and proposed procedure were                         verified by the physician, the nurse, the anesthetist                         and the technician in the endoscopy suite. Mental                         Status Examination: alert and oriented. Airway                         Examination: normal oropharyngeal airway and neck                         mobility. Respiratory Examination: clear to                         auscultation. CV Examination: normal. Prophylactic  Antibiotics: The patient does not require prophylactic                         antibiotics. Prior Anticoagulants: The patient has                         taken no previous anticoagulant or antiplatelet                          agents. ASA Grade Assessment: III - A patient with                         severe systemic disease. After reviewing the risks and                         benefits, the patient was deemed in satisfactory                         condition to undergo the procedure. The anesthesia                         plan was to use monitored anesthesia care (MAC).                         Immediately prior to administration of medications,                         the patient was re-assessed for adequacy to receive                         sedatives. The heart rate, respiratory rate, oxygen                         saturations, blood pressure, adequacy of pulmonary                         ventilation, and response to care were monitored                         throughout the procedure. The physical status of the                         patient was re-assessed after the procedure.                        After obtaining informed consent, the endoscope was                         passed under direct vision. Throughout the procedure,                         the patient's blood pressure, pulse, and oxygen                         saturations were monitored continuously. The Endoscope                         was introduced through the mouth, and advanced to the  second part of duodenum. The upper GI endoscopy was                         accomplished without difficulty. The patient tolerated                         the procedure well. Findings:      No endoscopic abnormality was evident in the esophagus to explain the       patient's complaint of dysphagia. Biopsies were obtained from the       proximal and distal esophagus with cold forceps for histology of       suspected eosinophilic esophagitis. Estimated blood loss was minimal.      A single 6 mm papule (nodule) with no bleeding and no stigmata of recent       bleeding was found at the gastroesophageal junction. Biopsies were taken        with a cold forceps for histology. Estimated blood loss was minimal.      The exam of the stomach was otherwise normal.      The examined duodenum was normal. Impression:            - No endoscopic esophageal abnormality to explain                         patient's dysphagia. Biopsied.                        - A single papule (nodule) found in the stomach.                         Biopsied.                        - Normal examined duodenum. Recommendation:        - Await pathology results.                        - Perform a colonoscopy today. Procedure Code(s):     --- Professional ---                        [phone], Esophagogastroduodenoscopy, flexible,                         transoral; with biopsy, single or multiple Diagnosis Code(s):     --- Professional ---                        R13.10, Dysphagia, unspecified                        K31.89, Other diseases of stomach and duodenum                        R93.3, Abnormal findings on diagnostic imaging of                         other parts of digestive tract CPT copyright 2019 [employer]. All rights reserved. The codes documented in this report are preliminary and upon coder review may  be revised to meet current compliance requirements. Andrey Farmer MD, MD 11/03/2021 2:01:48 PM  Number of Addenda: 0 Note Initiated On: 11/03/2021 1:17 PM Estimated Blood Loss:  Estimated blood loss was minimal.      [facility]

## 2021-11-03 NOTE — Transfer of Care (Signed)
Immediate Anesthesia Transfer of Care Note  Patient: Morgan [name]  Procedure(s) Performed: COLONOSCOPY WITH PROPOFOL ESOPHAGOGASTRODUODENOSCOPY (EGD) WITH PROPOFOL  Patient Location: PACU and Endoscopy Unit  Anesthesia Type:General  Level of Consciousness: drowsy  Airway & Oxygen Therapy: Patient Spontanous Breathing  Post-op Assessment: Report given to RN  Post vital signs: stable  Last Vitals:  Vitals Value Taken Time  BP    Temp    Pulse    Resp    SpO2      Last Pain:  Vitals:   11/03/21 1239  TempSrc: Temporal  PainSc: 0-No pain      Patients Stated Pain Goal: 0 (46/19/01 2224)  Complications: No notable events documented.

## 2021-11-03 NOTE — Progress Notes (Signed)
PT. HAD SUICIDAL THOUGHTS 10 YEARS AGO. SEES PSYCHIARIST EVERY THREE MONTHS.

## 2021-11-03 NOTE — H&P (Signed)
Outpatient short stay form Pre-procedure 11/03/2021  Lesly Rubenstein, MD  Primary Physician: [name], MD  Reason for visit:  Abnormal imaging and positive fecal calprotectin  History of present illness:   70 y/o lady with history of COPD on 3 L at night here for EGD/Colonoscopy due to abnormal barium swallow and colonoscopy for chronic diarrhea and positive fecal calprotectin. Last colonoscopy was in 2021 and had a few Ta's. Last EGD was last year where here pyloric stenosis was empirically dilated with no improvement in her symptoms. Has dysphagia to liquids and solids. Barium swallow with 13 mm barium tablet that hung up at [facility]. No blood thinners. History of cholecystectomy and appendectomy. Mother had colon cancer in her 24's.    Current Facility-Administered Medications:    0.9 %  sodium chloride infusion, , Intravenous, Continuous, Izaan Kingbird, Hilton Cork, MD  Medications Prior to Admission  Medication Sig Dispense Refill Last Dose   albuterol (VENTOLIN HFA) 108 (90 Base) MCG/ACT inhaler Inhale 1-2 puffs into the lungs every 6 (six) hours as needed for wheezing or shortness of breath. 18 g 0 Past Month   alendronate (FOSAMAX) 70 MG tablet Take 1 tablet (70 mg total) by mouth every 7 (seven) days. Take with a full glass of water on an empty stomach. 4 tablet 11 Past Week   aspirin-acetaminophen-caffeine (EXCEDRIN MIGRAINE) 250-250-65 MG tablet Take 1-2 tablets by mouth every 6 (six) hours as needed for headache.   Past Week   atorvastatin (LIPITOR) 40 MG tablet Take 1 tablet (40 mg total) by mouth daily. 90 tablet 1 11/02/2021   carvedilol (COREG) 12.5 MG tablet Take 12.5 mg by mouth 2 (two) times daily.   11/02/2021 at 0700   cetirizine (ZYRTEC ALLERGY) 10 MG tablet Take 1 tablet (10 mg total) by mouth daily. 30 tablet 0 Past Week   FLUoxetine (PROZAC) 40 MG capsule Take 40 mg by mouth daily.   11/02/2021   fluticasone-salmeterol (ADVAIR HFA) 115-21 MCG/ACT inhaler Inhale 2 puffs  into the lungs 2 (two) times daily. 1 each 2 Past Month   furosemide (LASIX) 40 MG tablet Take 1 tablet (40 mg total) by mouth daily. 30 tablet 0 11/02/2021   gabapentin (NEURONTIN) 400 MG capsule Take 400 mg by mouth 3 (three) times daily as needed (for nerve pain).   11/02/2021   hydrOXYzine (ATARAX/VISTARIL) 25 MG tablet Take 25 mg by mouth every 8 (eight) hours as needed for anxiety.   11/02/2021   ibuprofen (ADVIL) 600 MG tablet Take 1 tablet (600 mg total) by mouth every 6 (six) hours as needed. 30 tablet 0 Past Week   meloxicam (MOBIC) 7.5 MG tablet Take 7.5 mg by mouth daily.   Past Month   mirtazapine (REMERON) 15 MG tablet Take 15 mg by mouth at bedtime.   11/02/2021   nitrofurantoin, macrocrystal-monohydrate, (MACROBID) 100 MG capsule Take 1 capsule (100 mg total) by mouth 2 (two) times daily. 10 capsule 0 Past Month   omeprazole (PRILOSEC) 40 MG capsule TAKE 1 CAPSULE BY MOUTH EVERY DAY (Patient taking differently: Take 80 mg by mouth daily.) 90 capsule 3 11/02/2021   ondansetron (ZOFRAN-ODT) 4 MG disintegrating tablet Take 1-2 tablets by mouth every 6 hours as needed 360 tablet 3 11/02/2021   potassium chloride SA (KLOR-CON) 20 MEQ tablet Take 1 tablet (20 mEq total) by mouth daily. With furosemide 30 tablet 0 11/02/2021   predniSONE (DELTASONE) 20 MG tablet Take 2 tablets daily with breakfast. 10 tablet 0 Past Month  valsartan (DIOVAN) 320 MG tablet Take 320 mg by mouth daily.   11/02/2021   linaclotide (LINZESS) 145 MCG CAPS capsule Take 145 mcg by mouth daily before breakfast. (Patient not taking: Reported on 11/03/2021)   Not Taking   OXYGEN Inhale 2 L/min into the lungs See admin instructions. 2 L/min at bedtime and as needed for shortness of breath throughout the day      polyethylene glycol-electrolytes (NULYTELY) 420 g solution Take 4,000 mLs by mouth as directed.        Allergies  Allergen Reactions   Meperidine Hives and Anaphylaxis   Strawberry Extract Hives and  Swelling   Carafate [Sucralfate] Nausea Only and Other (See Comments)    Severe nausea   Wasp Venom Hives     Past Medical History:  Diagnosis Date   Allergy    Anemia    Anxiety    Arthritis    CHF (congestive heart failure) (HCC)    Depression    GERD (gastroesophageal reflux disease)    Hypertension    MVP (mitral valve prolapse)     Review of systems:  Otherwise negative.    Physical Exam  Gen: Alert, oriented. Appears stated age.  HEENT: NPERRLA. Lungs: No respiratory distress CV: RRR Abd: soft, benign, no masses Ext: No edema    Planned procedures: Proceed with EGD/colonoscopy. The patient understands the nature of the planned procedure, indications, risks, alternatives and potential complications including but not limited to bleeding, infection, perforation, damage to internal organs and possible oversedation/side effects from anesthesia. The patient agrees and gives consent to proceed.  Please refer to procedure notes for findings, recommendations and patient disposition/instructions.     Lesly Rubenstein, MD [facility]

## 2021-11-03 NOTE — Anesthesia Preprocedure Evaluation (Signed)
Anesthesia Evaluation  Patient identified by MRN, date of birth, ID band Patient awake    Reviewed: Allergy & Precautions, NPO status , Patient's Chart, lab work & pertinent test results  History of Anesthesia Complications Negative for: history of anesthetic complications  Airway Mallampati: II  TM Distance: >3 FB Neck ROM: Full    Dental  (+) Upper Dentures, Lower Dentures   Pulmonary neg sleep apnea, COPD,  COPD inhaler and oxygen dependent, Patient abstained from smoking.Not current smoker, former smoker,  3L/min O2 by nasal cannula at night   Pulmonary exam normal breath sounds clear to auscultation       Cardiovascular Exercise Tolerance: Poor METShypertension, +CHF  (-) CAD and (-) Past MI (-) dysrhythmias + Valvular Problems/Murmurs MVP  Rhythm:Regular Rate:Normal - Systolic murmurs TTE 5102 relatively unremarkable   Neuro/Psych PSYCHIATRIC DISORDERS Anxiety Depression CVA, No Residual Symptoms    GI/Hepatic PUD, GERD  Controlled,(+)     (-) substance abuse  , Abnormal Barium swallow Elevated fecal calprotectin    Endo/Other  neg diabetes  Renal/GU CRFRenal disease     Musculoskeletal  (+) Arthritis ,   Abdominal   Peds  Hematology  (+) anemia ,   Anesthesia Other Findings Recent UTI 10/2021 Allergy Anemia Anxiety Arthritis CHF (congestive heart failure) ([place]) 08/23/18  Left ventricle: The cavity size was normal. Wall thickness wasnormal. Systolic function was normal. The estimated ejectionfraction was in the range of 55% to 65%.  - Mitral valve: There was mild regurgitation GERD (gastroesophageal reflux disease) Hypertension MVP (mitral valve prolapse)  Reproductive/Obstetrics                             Anesthesia Physical  Anesthesia Plan  ASA: 3  Anesthesia Plan: General   Post-op Pain Management:    Induction: Intravenous  PONV Risk Score and Plan: 3 and  Treatment may vary due to age or medical condition and Propofol infusion  Airway Management Planned: Nasal Cannula and Natural Airway  Additional Equipment: None  Intra-op Plan:   Post-operative Plan:   Informed Consent:   Plan Discussed with:   Anesthesia Plan Comments: (Discussed risks of anesthesia with patient, including possibility of difficulty with spontaneous ventilation under anesthesia necessitating airway intervention, PONV, and rare risks such as cardiac or respiratory or neurological events, and allergic reactions. Patient understands.)        Anesthesia Quick Evaluation

## 2021-11-03 NOTE — Op Note (Addendum)
[facility] Patient Name: Morgan [name] Procedure Date: 11/03/2021 1:06 PM MRN: [id number] Account #: [id number] Date of Birth: 1951/12/04 Admit Type: Outpatient Age: 70 Room: [facility] ENDO ROOM 3 Gender: Female Note Status: Supervisor Override Instrument Name: Jasper Riling [id number] Procedure:             Colonoscopy Indications:           Chronic diarrhea, Positive Fecal Calprotectin Providers:             Andrey Farmer MD, MD Referring MD:          Jobe Marker. Einar Pheasant (Referring MD) Medicines:             Monitored Anesthesia Care Complications:         No immediate complications. Estimated blood loss:                         Minimal. Procedure:             Pre-Anesthesia Assessment:                        - Prior to the procedure, a History and Physical was                         performed, and patient medications and allergies were                         reviewed. The patient is competent. The risks and                         benefits of the procedure and the sedation options and                         risks were discussed with the patient. All questions                         were answered and informed consent was obtained.                         Patient identification and proposed procedure were                         verified by the physician, the nurse, the anesthetist                         and the technician in the endoscopy suite. Mental                         Status Examination: alert and oriented. Airway                         Examination: normal oropharyngeal airway and neck                         mobility. Respiratory Examination: clear to                         auscultation. CV Examination: normal. Prophylactic  Antibiotics: The patient does not require prophylactic                         antibiotics. Prior Anticoagulants: The patient has                         taken no previous anticoagulant or  antiplatelet                         agents. ASA Grade Assessment: III - A patient with                         severe systemic disease. After reviewing the risks and                         benefits, the patient was deemed in satisfactory                         condition to undergo the procedure. The anesthesia                         plan was to use monitored anesthesia care (MAC).                         Immediately prior to administration of medications,                         the patient was re-assessed for adequacy to receive                         sedatives. The heart rate, respiratory rate, oxygen                         saturations, blood pressure, adequacy of pulmonary                         ventilation, and response to care were monitored                         throughout the procedure. The physical status of the                         patient was re-assessed after the procedure.                        After obtaining informed consent, the colonoscope was                         passed under direct vision. Throughout the procedure,                         the patient's blood pressure, pulse, and oxygen                         saturations were monitored continuously. The                         Colonoscope was introduced through the anus and  advanced to the the terminal ileum. The colonoscopy                         was performed without difficulty. The patient                         tolerated the procedure well. The quality of the bowel                         preparation was good. Findings:      The perianal and digital rectal examinations were normal.      The terminal ileum appeared normal. Biopsies were taken with a cold       forceps for histology. Estimated blood loss was minimal.      A 12 mm polyp was found in the cecum. The polyp was Paris classification       Is (protruding, sessile). Preparations were made for mucosal resection.        NBI was done for demarcation of the lesion. Eleview was injected to       raise the lesion. Snare mucosal resection was performed. Resection and       retrieval were complete. To prevent bleeding post-intervention, two       hemostatic clips were successfully placed. There was no bleeding during,       or at the end, of the procedure. Estimated blood loss was minimal.      Biopsies for histology were taken with a cold forceps from the entire       colon for evaluation of microscopic colitis. Estimated blood loss was       minimal.      Multiple small-mouthed diverticula were found in the sigmoid colon.      Internal hemorrhoids were found during retroflexion. The hemorrhoids       were Grade I (internal hemorrhoids that do not prolapse).      The exam was otherwise without abnormality on direct and retroflexion       views. Impression:            - The examined portion of the ileum was normal.                         Biopsied.                        - One 12 mm polyp in the cecum, removed with mucosal                         resection. Resected and retrieved. Clips were placed.                        - Diverticulosis in the sigmoid colon.                        - Internal hemorrhoids.                        - The examination was otherwise normal on direct and                         retroflexion views.                        -  Mucosal resection was performed. Resection and                         retrieval were complete.                        - Biopsies were taken with a cold forceps from the                         entire colon for evaluation of microscopic colitis. Recommendation:        - Discharge patient to home.                        - Resume previous diet.                        - Continue present medications.                        - Await pathology results.                        - Repeat colonoscopy for surveillance based on                         pathology results.                         - Return to referring physician as previously                         scheduled. Procedure Code(s):     --- Professional ---                        [phone], Colonoscopy, flexible; with endoscopic mucosal                         resection                        45380, 88, Colonoscopy, flexible; with biopsy, single                         or multiple Diagnosis Code(s):     --- Professional ---                        K64.0, First degree hemorrhoids                        K63.5, Polyp of colon                        K52.9, Noninfective gastroenteritis and colitis,                         unspecified                        K57.30, Diverticulosis of large intestine without                         perforation or abscess without bleeding CPT copyright 2019 American  Medical Association. All rights reserved. The codes documented in this report are preliminary and upon coder review may  be revised to meet current compliance requirements. Andrey Farmer MD, MD 11/03/2021 2:11:24 PM Number of Addenda: 0 Note Initiated On: 11/03/2021 1:06 PM Scope Withdrawal Time: 0 hours 20 minutes 20 seconds  Total Procedure Duration: 0 hours 28 minutes 16 seconds  Estimated Blood Loss:  Estimated blood loss was minimal.      [facility]

## 2021-11-06 ENCOUNTER — Encounter: Payer: Self-pay | Admitting: Gastroenterology

## 2021-11-07 ENCOUNTER — Other Ambulatory Visit: Payer: Medicare HMO

## 2021-11-07 LAB — SURGICAL PATHOLOGY

## 2021-11-15 DIAGNOSIS — N1831 Chronic kidney disease, stage 3a: Secondary | ICD-10-CM

## 2021-11-15 DIAGNOSIS — E782 Mixed hyperlipidemia: Secondary | ICD-10-CM | POA: Diagnosis not present

## 2021-11-15 DIAGNOSIS — I10 Essential (primary) hypertension: Secondary | ICD-10-CM

## 2021-11-15 DIAGNOSIS — J449 Chronic obstructive pulmonary disease, unspecified: Secondary | ICD-10-CM | POA: Diagnosis not present

## 2021-11-15 DIAGNOSIS — I5032 Chronic diastolic (congestive) heart failure: Secondary | ICD-10-CM | POA: Diagnosis not present

## 2021-11-21 DIAGNOSIS — M5481 Occipital neuralgia: Secondary | ICD-10-CM | POA: Diagnosis not present

## 2021-11-21 DIAGNOSIS — R569 Unspecified convulsions: Secondary | ICD-10-CM | POA: Diagnosis not present

## 2021-11-21 DIAGNOSIS — Z8673 Personal history of transient ischemic attack (TIA), and cerebral infarction without residual deficits: Secondary | ICD-10-CM | POA: Diagnosis not present

## 2021-11-21 DIAGNOSIS — G20 Parkinson's disease: Secondary | ICD-10-CM | POA: Diagnosis not present

## 2021-11-21 DIAGNOSIS — G43119 Migraine with aura, intractable, without status migrainosus: Secondary | ICD-10-CM | POA: Diagnosis not present

## 2021-11-22 ENCOUNTER — Other Ambulatory Visit: Payer: Self-pay | Admitting: Neurology

## 2021-11-22 DIAGNOSIS — G20 Parkinson's disease: Secondary | ICD-10-CM

## 2021-11-23 DIAGNOSIS — I7789 Other specified disorders of arteries and arterioles: Secondary | ICD-10-CM | POA: Diagnosis not present

## 2021-11-23 DIAGNOSIS — I10 Essential (primary) hypertension: Secondary | ICD-10-CM | POA: Diagnosis not present

## 2021-11-23 DIAGNOSIS — I5032 Chronic diastolic (congestive) heart failure: Secondary | ICD-10-CM | POA: Diagnosis not present

## 2021-11-23 DIAGNOSIS — N1832 Chronic kidney disease, stage 3b: Secondary | ICD-10-CM | POA: Diagnosis not present

## 2021-11-23 DIAGNOSIS — E782 Mixed hyperlipidemia: Secondary | ICD-10-CM | POA: Diagnosis not present

## 2021-11-24 DIAGNOSIS — J449 Chronic obstructive pulmonary disease, unspecified: Secondary | ICD-10-CM | POA: Diagnosis not present

## 2021-11-24 DIAGNOSIS — I509 Heart failure, unspecified: Secondary | ICD-10-CM | POA: Diagnosis not present

## 2021-11-26 IMAGING — CT CT ABD-PELV W/ CM
2 of 5 series · 17 of 46 positions shown, 19 images · IV contrast (omnipaque)
Comparison: 05/17/2019

CLINICAL DATA: Abdominal distension

EXAM:
CT ABDOMEN AND PELVIS WITH CONTRAST
TECHNIQUE: Multidetector CT imaging of the abdomen and pelvis was performed
using the standard protocol following bolus administration of
intravenous contrast.
CONTRAST:  100mL OMNIPAQUE IOHEXOL 300 MG/ML  SOLN

[Series 2: axial st · axial · 0.90mm/px · z∈[+966,+1400]mm · 14 of 101 slices shown, 16 images]
[im 7/101  soft-tissue]
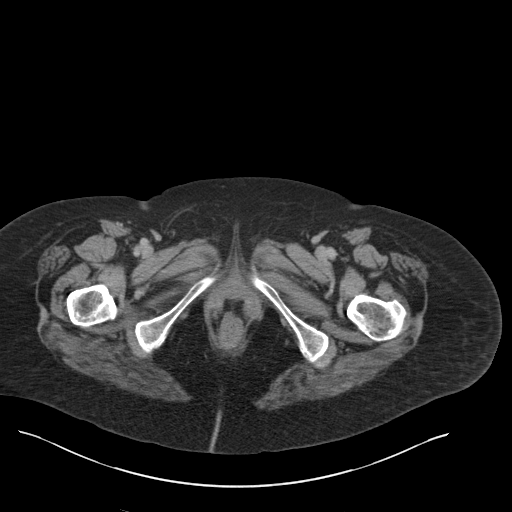
[im 7/101  bone]
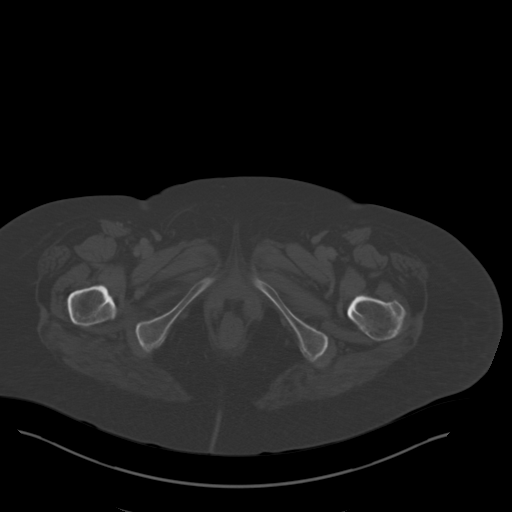
[im 13/101  soft-tissue]
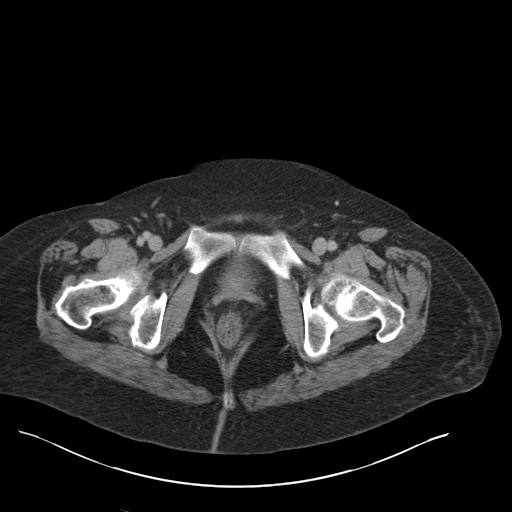
[im 19/101  soft-tissue]
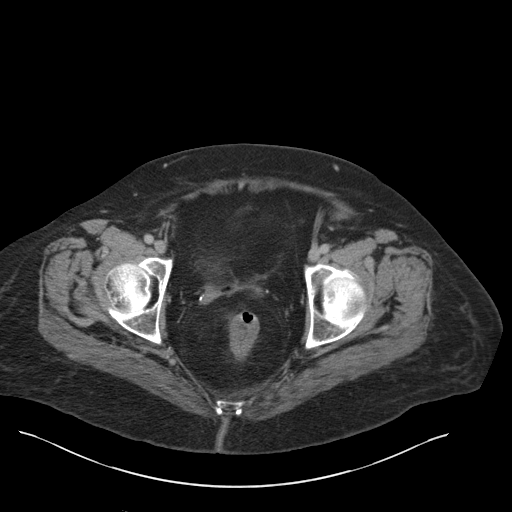
[im 26/101  soft-tissue]
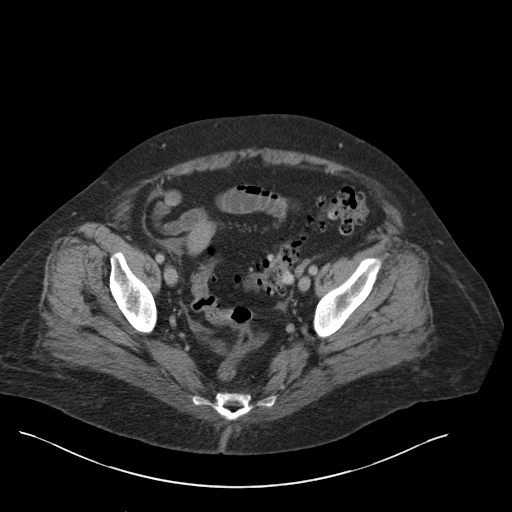
[im 32/101  soft-tissue]
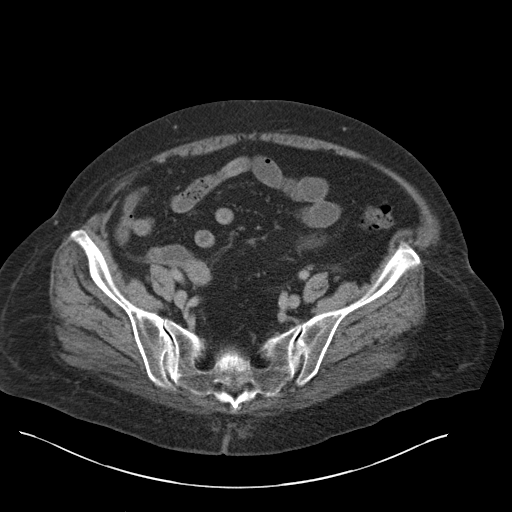
[im 38/101  soft-tissue]
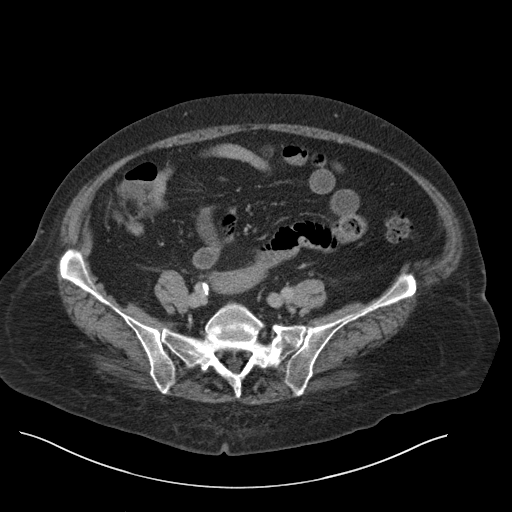
[im 44/101  soft-tissue]
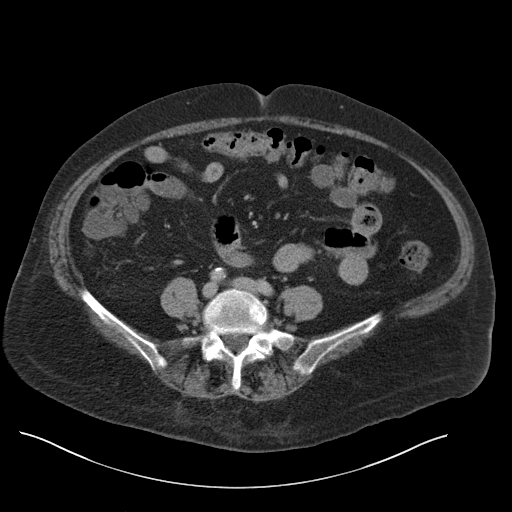
[im 57/101  soft-tissue]
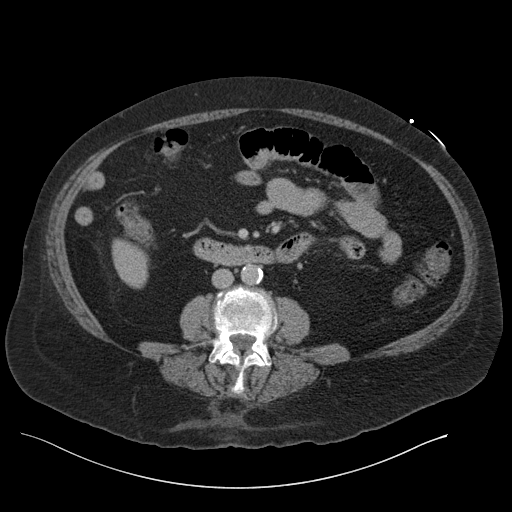
[im 63/101  soft-tissue]
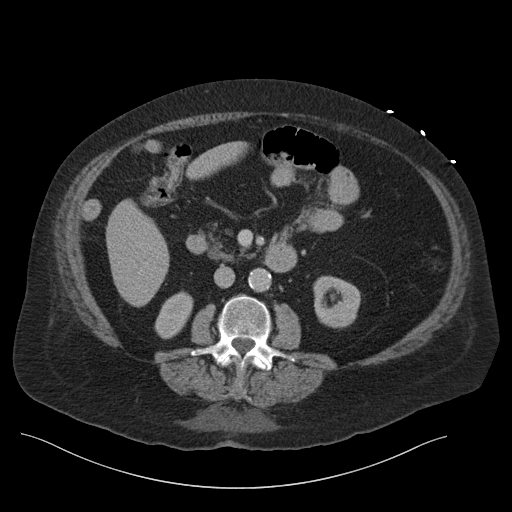
[im 63/101  bone]
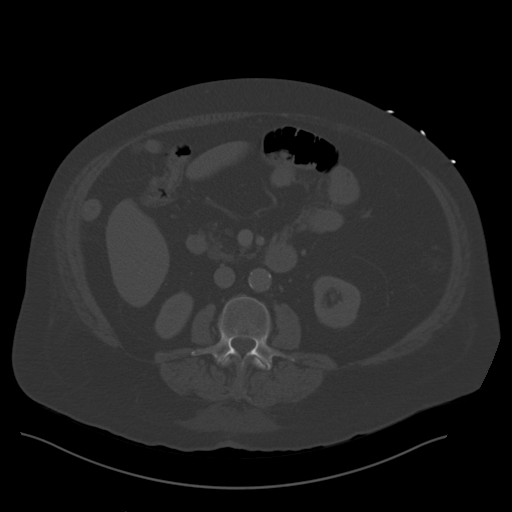
[im 69/101  soft-tissue]
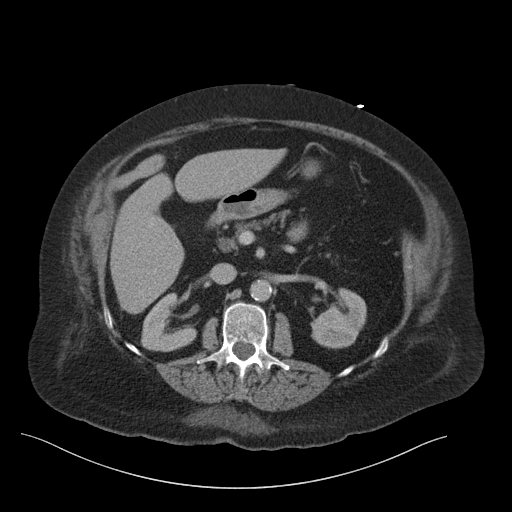
[im 76/101  soft-tissue]
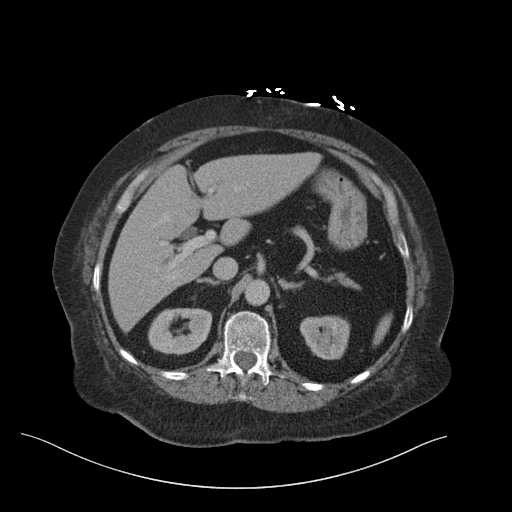
[im 82/101  soft-tissue]
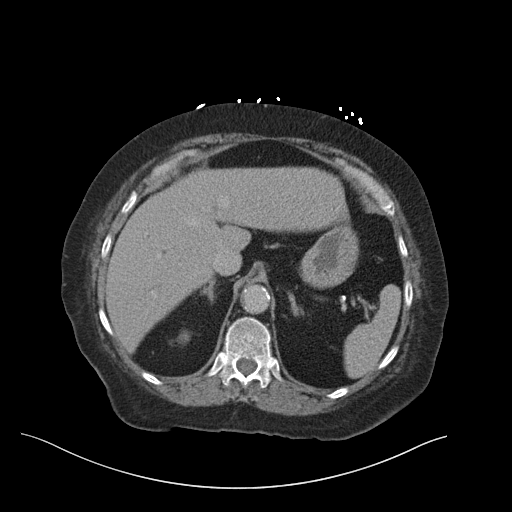
[im 88/101  soft-tissue]
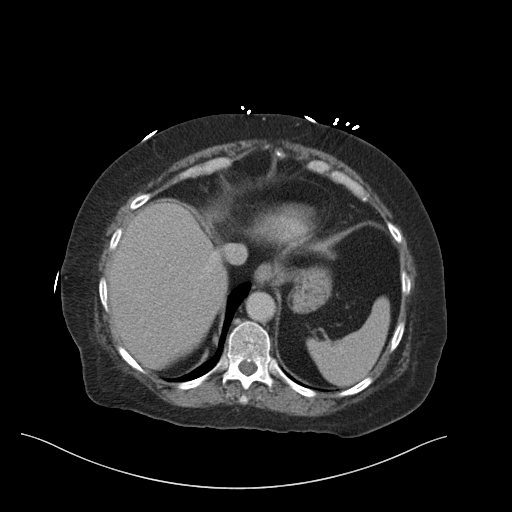
[im 94/101  soft-tissue]
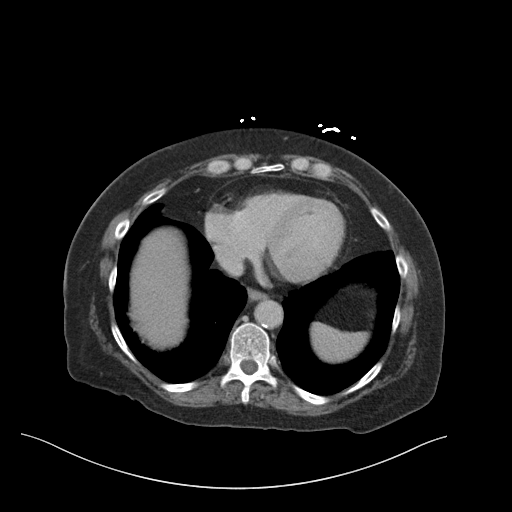

[Series 4: coronal st · coronal · 0.94mm/px · 3 of 176 slices shown]
[im 59/176  soft-tissue]
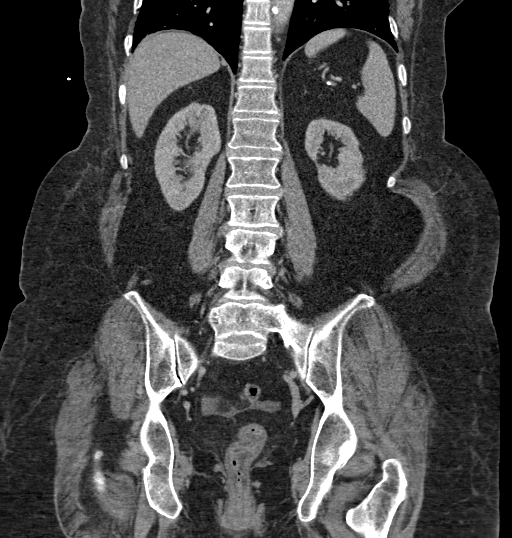
[im 78/176  soft-tissue]
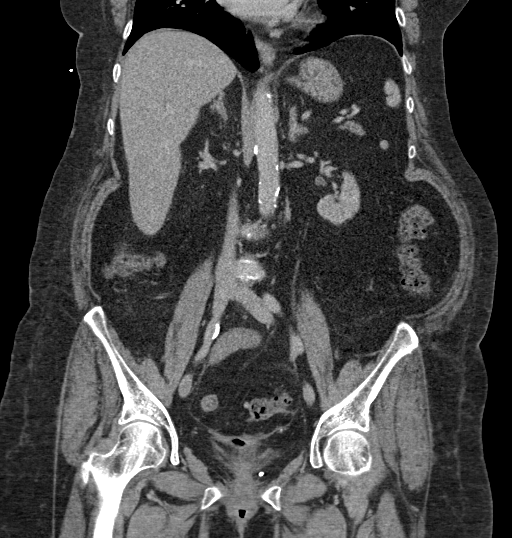
[im 98/176  soft-tissue]
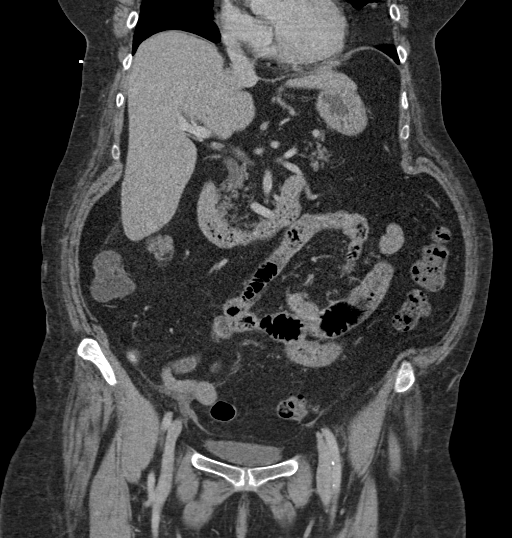

[17 of 46 positions shown; findings below may reference images not displayed]

FINDINGS: Lower chest: No acute abnormality.

Hepatobiliary: No focal liver abnormality is seen. Status post
interval cholecystectomy. No biliary dilatation.

Pancreas: Unremarkable.

Spleen: Unremarkable.

Adrenals/Urinary Tract: Adrenals are unremarkable. Kidneys and
partially distended bladder are unremarkable.

Stomach/Bowel: Stomach is within normal limits. There are few mildly
dilated loops of small bowel with air-fluid levels. Distal colonic
diverticulosis.

Vascular/Lymphatic: Aortic atherosclerosis. No enlarged lymph nodes
identified.

Reproductive: Status post hysterectomy. No adnexal masses.

Other: Small volume free fluid in the pelvis presumably related to
above. No acute abnormality of the abdominal wall.

Musculoskeletal: No acute osseous abnormality.
IMPRESSION: Few mildly dilated loops of small bowel in the mid abdomen with
air-fluid levels suggesting low-grade obstruction.

Interval cholecystectomy.

Colonic diverticulosis.

## 2021-11-27 IMAGING — DX DG ABDOMEN 1V
2 series · 2 of 2 positions shown · non-contrast
Comparison: Portable exam 2696 hours compared to 09/03/2017

CLINICAL DATA: Small-bowel obstruction, not feeling better

EXAM:
ABDOMEN - 1 VIEW

[abdomen kub (1 of 2)]
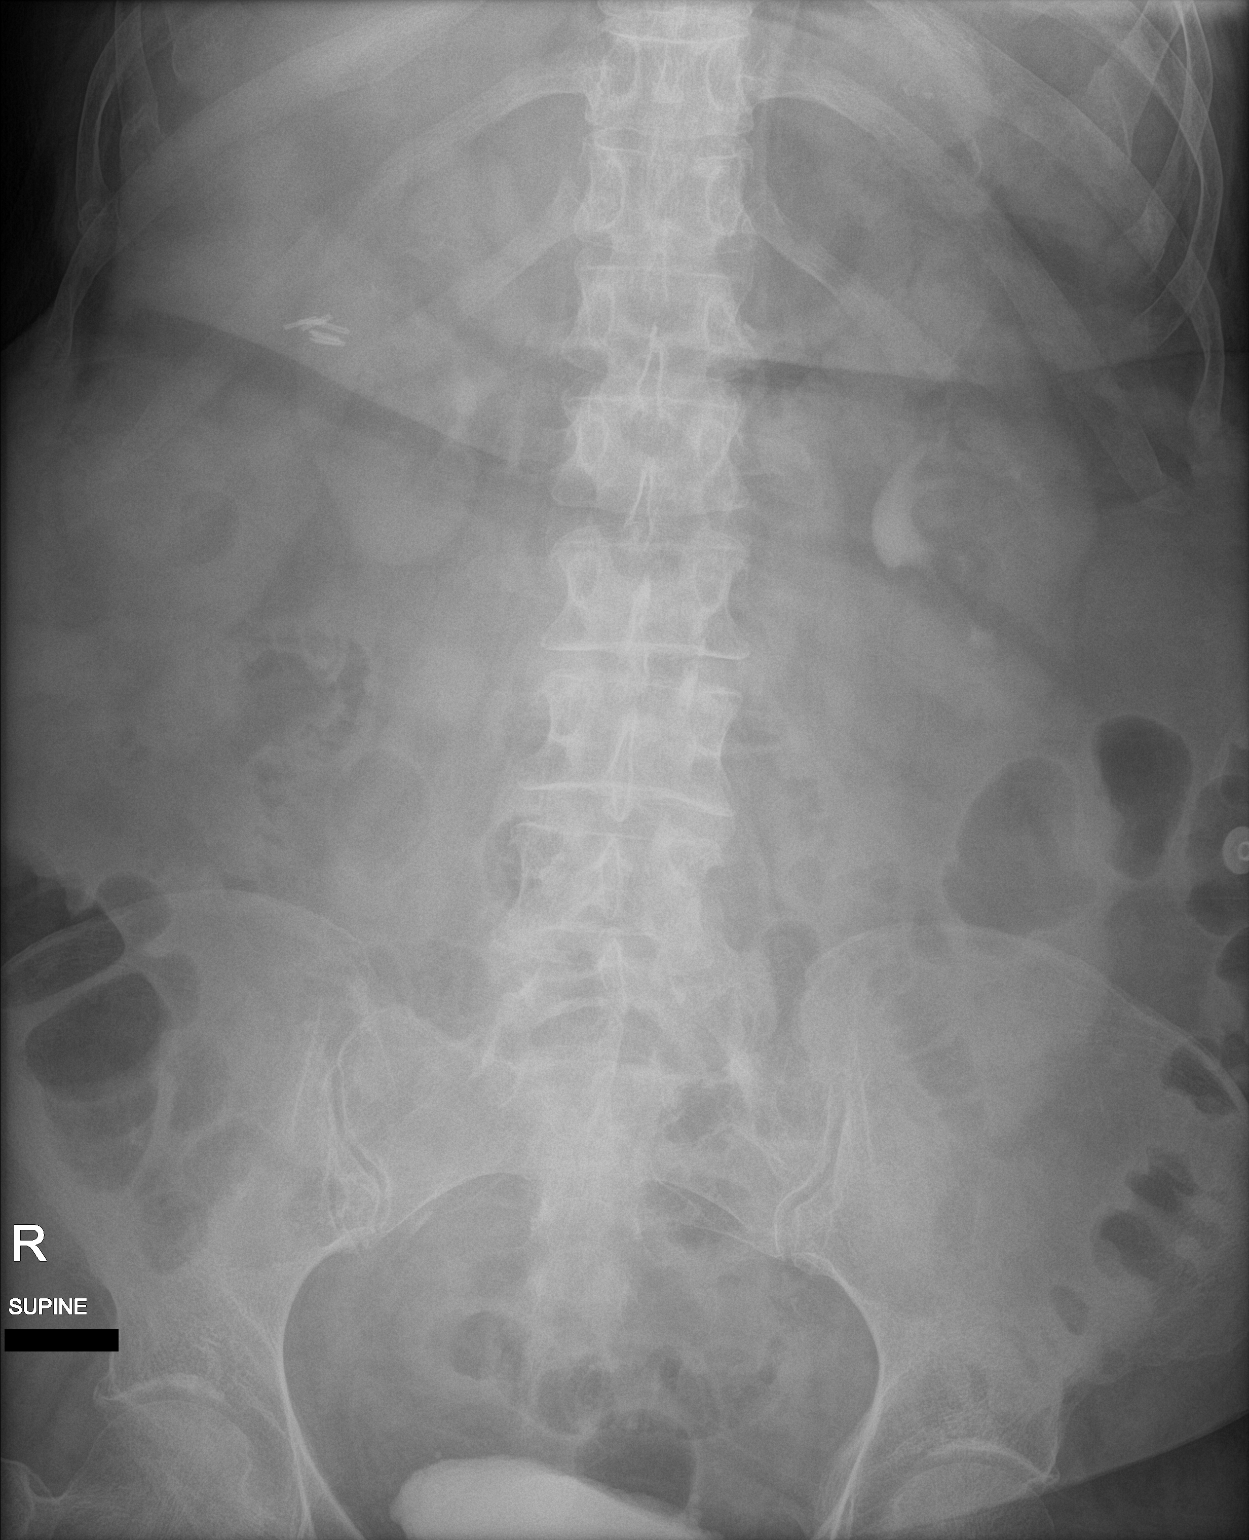

[abdomen kub (2 of 2)]
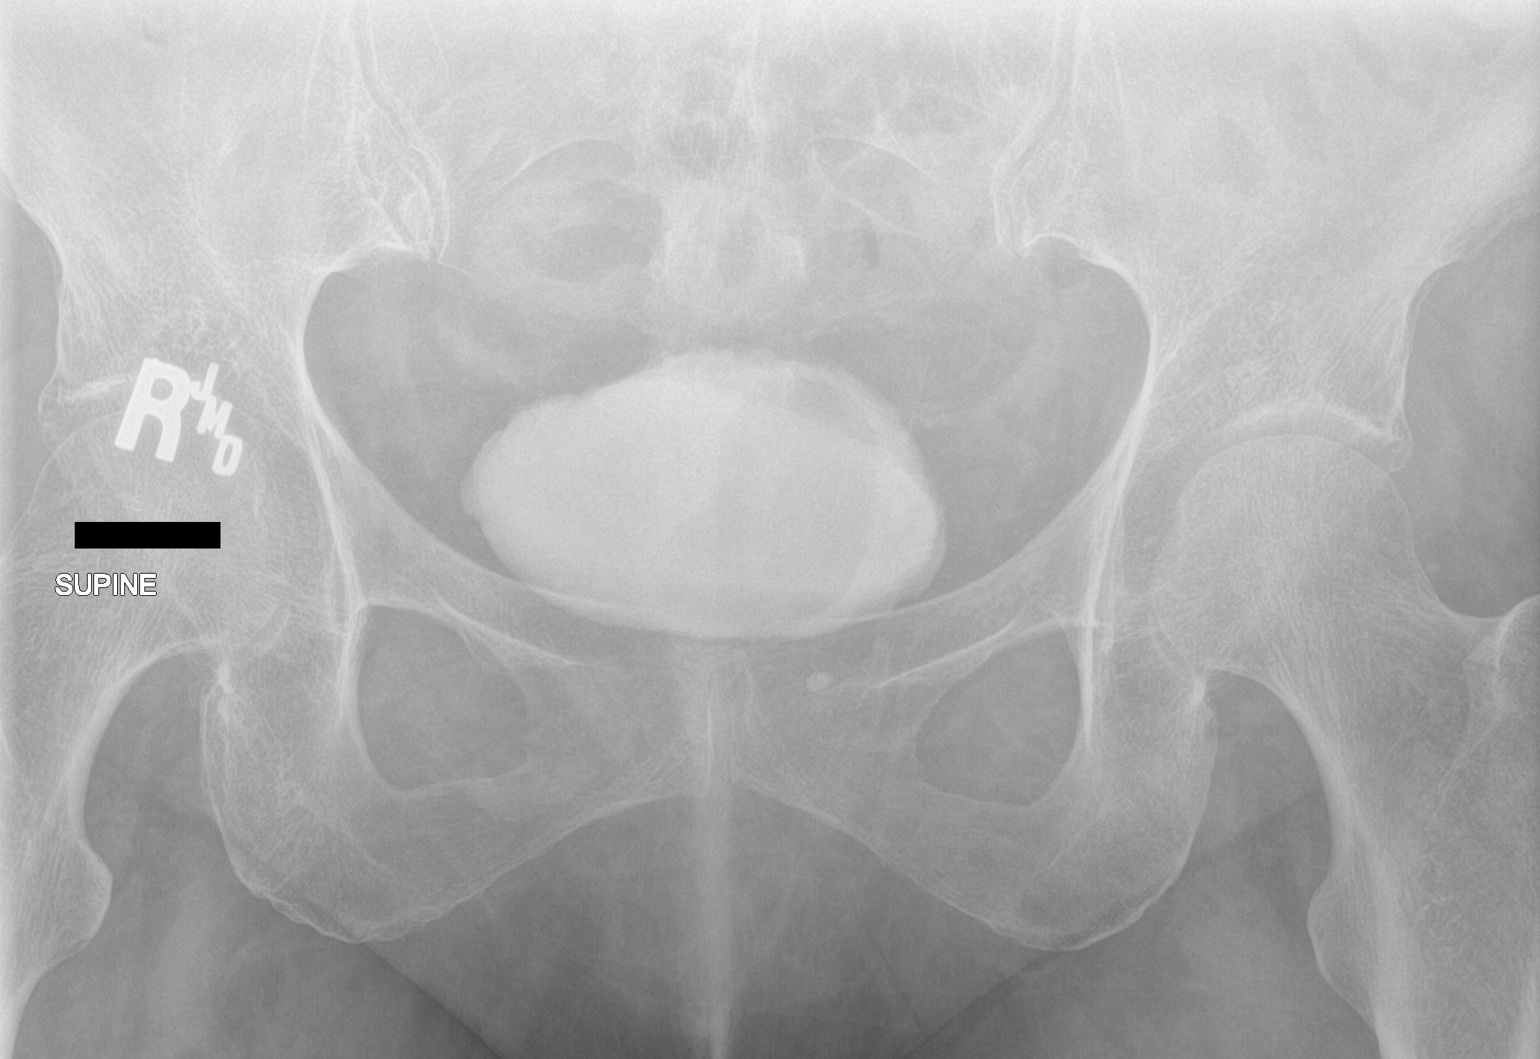

[2 of 2 positions shown; findings below may reference images not displayed]

FINDINGS: Excreted contrast material within renal collecting systems and
bladder.

Decreased small bowel distension since prior study.

Increased colonic gas.

No bowel wall thickening.

Surgical clips RIGHT upper quadrant likely reflect cholecystectomy.

Bones demineralized.
IMPRESSION: Decreased small bowel distension since prior study

## 2021-12-04 DIAGNOSIS — M5451 Vertebrogenic low back pain: Secondary | ICD-10-CM | POA: Diagnosis not present

## 2021-12-04 DIAGNOSIS — M6281 Muscle weakness (generalized): Secondary | ICD-10-CM | POA: Diagnosis not present

## 2021-12-04 DIAGNOSIS — R2681 Unsteadiness on feet: Secondary | ICD-10-CM | POA: Diagnosis not present

## 2021-12-04 DIAGNOSIS — R262 Difficulty in walking, not elsewhere classified: Secondary | ICD-10-CM | POA: Diagnosis not present

## 2021-12-05 ENCOUNTER — Ambulatory Visit
Admission: RE | Admit: 2021-12-05 | Discharge: 2021-12-05 | Disposition: A | Discharge status: Home / Self Care | Payer: Medicare HMO | Source: Ambulatory Visit | Attending: Neurology | Admitting: Neurology

## 2021-12-05 DIAGNOSIS — J3489 Other specified disorders of nose and nasal sinuses: Secondary | ICD-10-CM | POA: Diagnosis not present

## 2021-12-05 DIAGNOSIS — G20 Parkinson's disease: Secondary | ICD-10-CM

## 2021-12-05 IMAGING — CT CT ABD-PELV W/ CM
2 of 5 series · 16 of 46 positions shown, 18 images · IV contrast (OMNIPAQUE)
Comparison: Abdominal radiographs, 08/10/2020.  CT, 08/09/2020.

CLINICAL DATA: Left lower quadrant abdominal pain.

EXAM:
CT ABDOMEN AND PELVIS WITH CONTRAST
TECHNIQUE: Multidetector CT imaging of the abdomen and pelvis was performed
using the standard protocol following bolus administration of
intravenous contrast.
CONTRAST:  100mL OMNIPAQUE IOHEXOL 300 MG/ML  SOLN

[Series 2: axial st · axial · 0.92mm/px · z∈[-509,-54]mm · 13 of 106 slices shown, 15 images]
[im 8/106  soft-tissue]
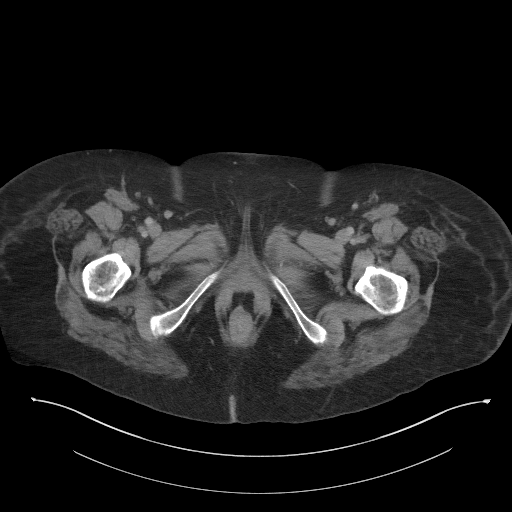
[im 8/106  bone]
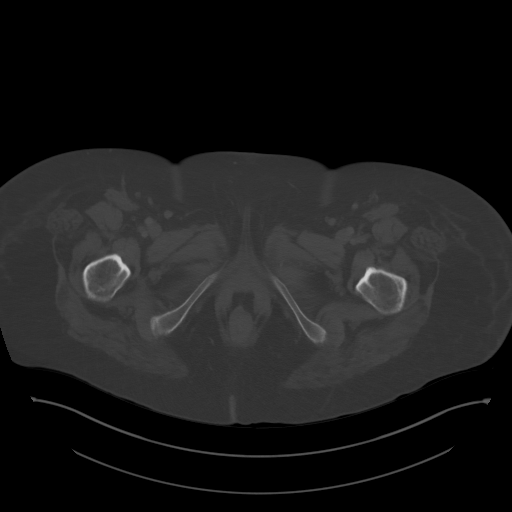
[im 15/106  soft-tissue]
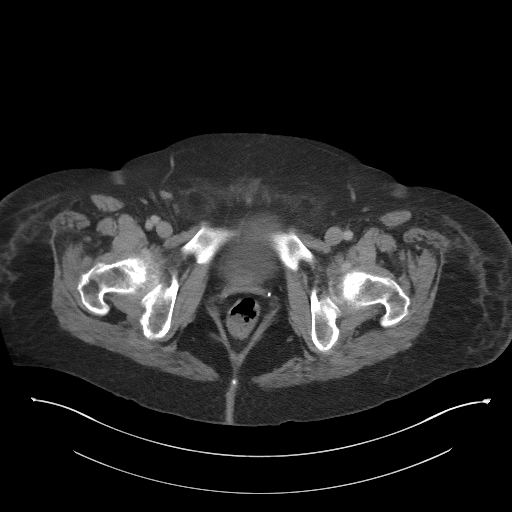
[im 22/106  soft-tissue]
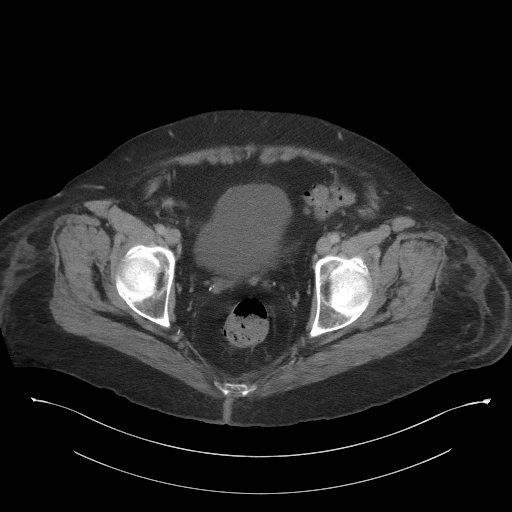
[im 29/106  soft-tissue]
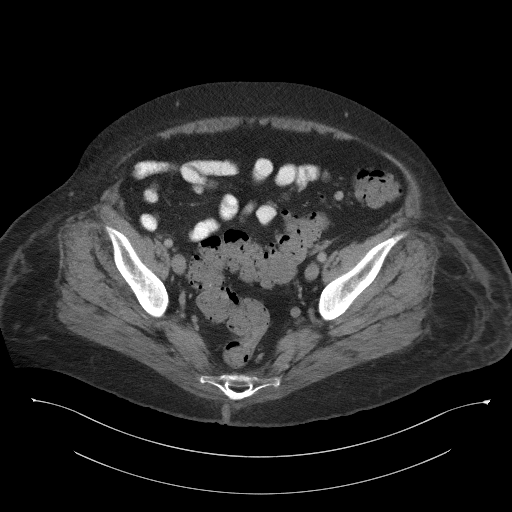
[im 36/106  soft-tissue]
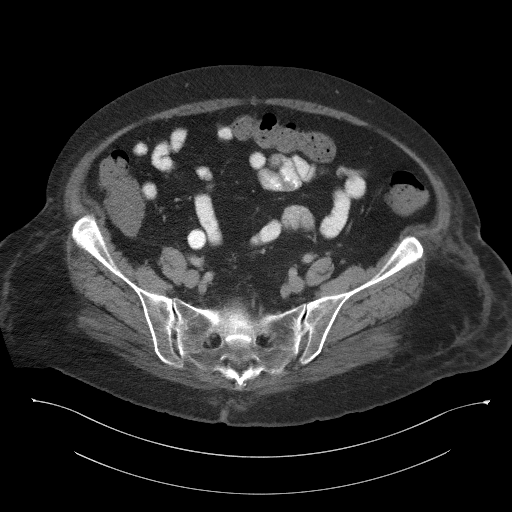
[im 43/106  soft-tissue]
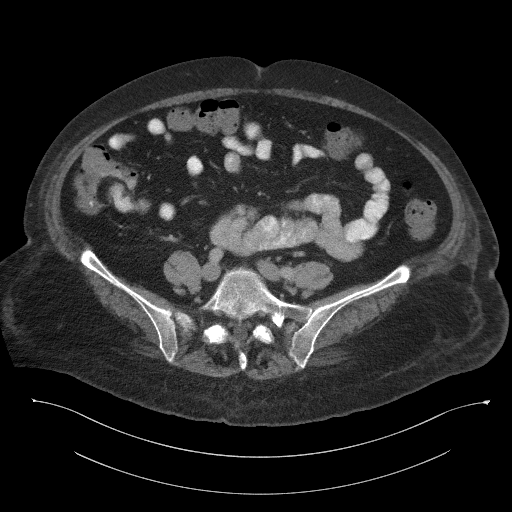
[im 57/106  soft-tissue]
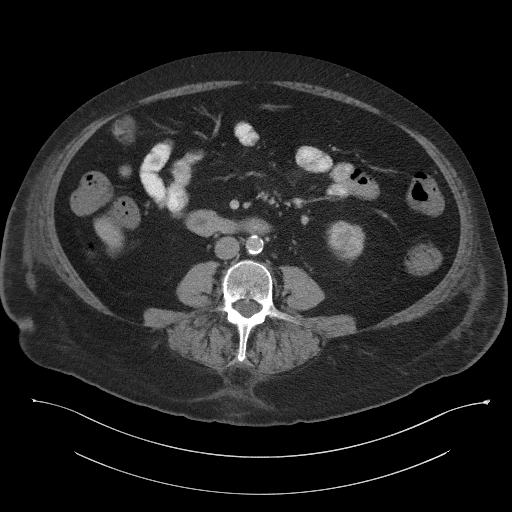
[im 64/106  soft-tissue]
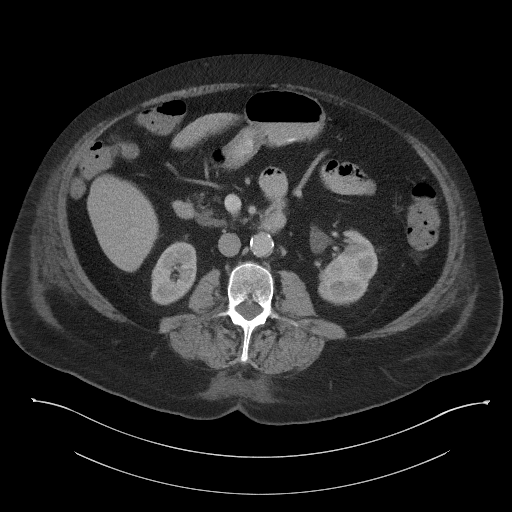
[im 71/106  soft-tissue]
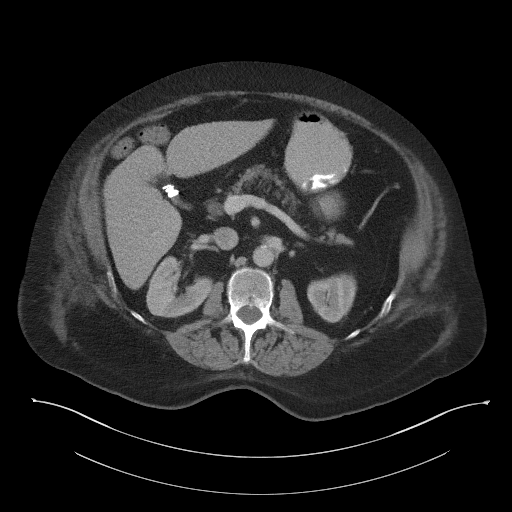
[im 71/106  bone]
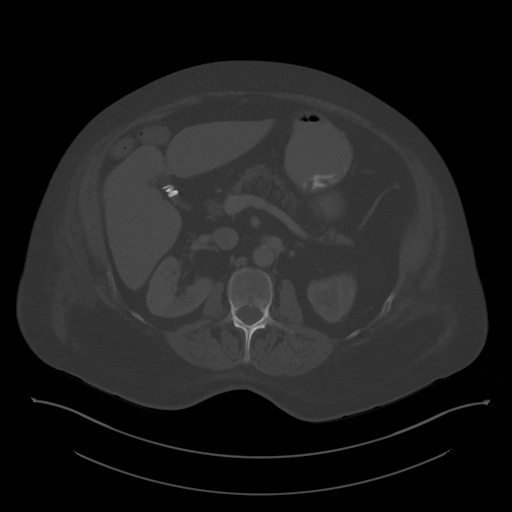
[im 78/106  soft-tissue]
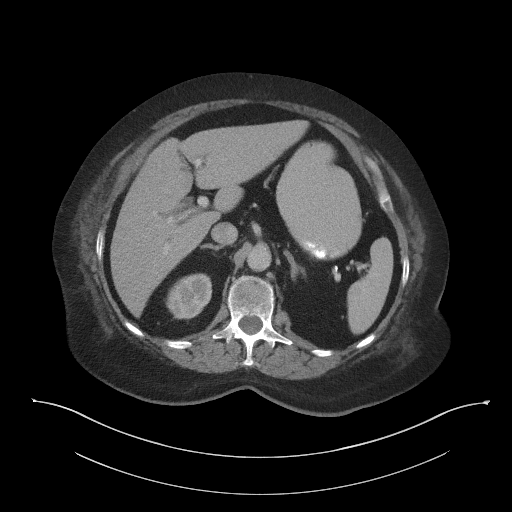
[im 85/106  soft-tissue]
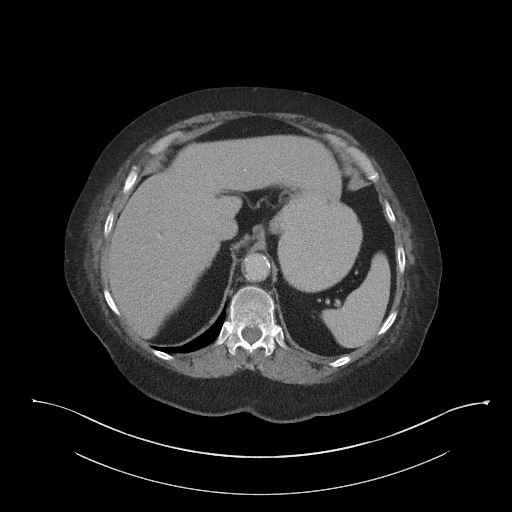
[im 92/106  soft-tissue]
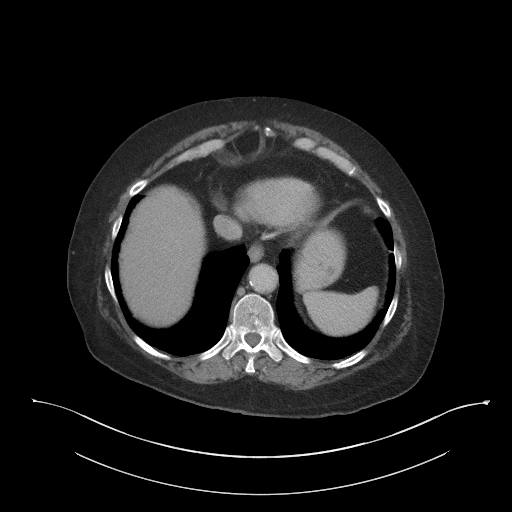
[im 99/106  soft-tissue]
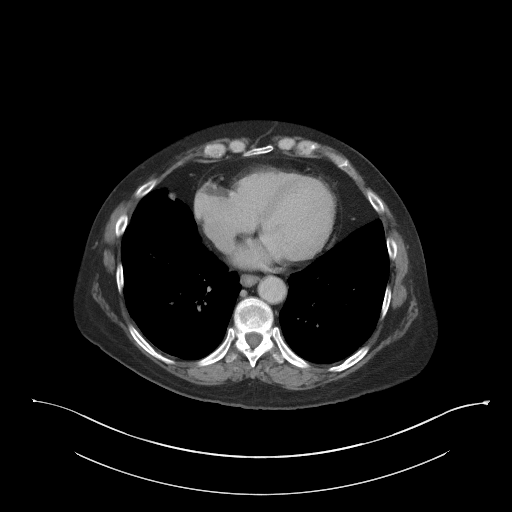

[Series 5: coronal st · coronal · 0.98mm/px · 3 of 125 slices shown]
[im 42/125  soft-tissue]
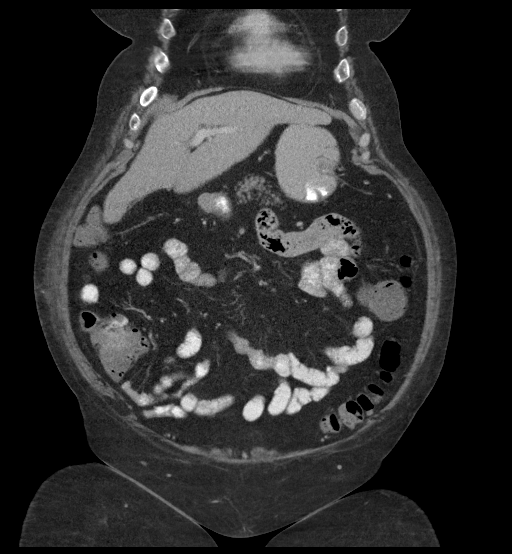
[im 56/125  soft-tissue]
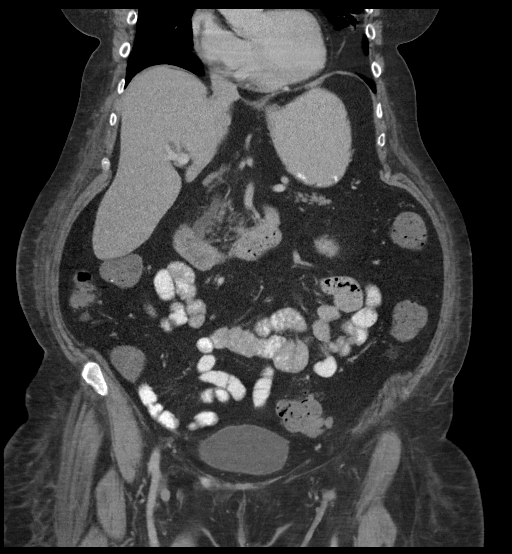
[im 69/125  soft-tissue]
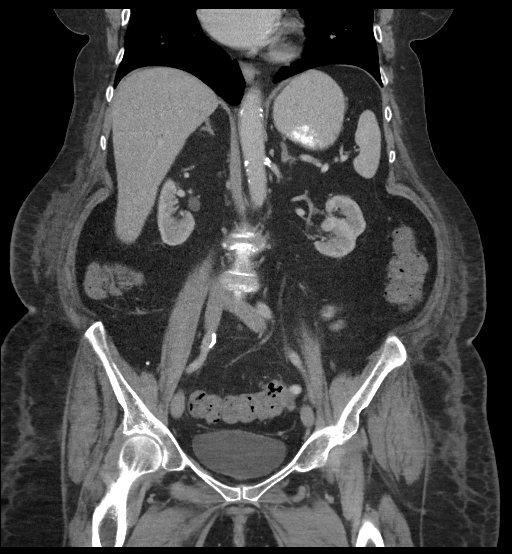

[16 of 46 positions shown; findings below may reference images not displayed]

FINDINGS: Lower chest: No acute abnormality.

Hepatobiliary: Liver normal in size and overall attenuation. No mass
or focal lesion. Status post cholecystectomy. Mild intra and
extrahepatic bile duct dilation with normal distal tapering common
bile duct. This is stable from the prior CT.

Pancreas: Unremarkable. No pancreatic ductal dilatation or
surrounding inflammatory changes.

Spleen: Normal in size without focal abnormality.

Adrenals/Urinary Tract: No adrenal masses. Kidneys are normal in
orientation and position. There is symmetric renal enhancement and
excretion. Bilateral renal cortical thinning. No mass, stone or
hydronephrosis. Normal ureters. Normal bladder.

Stomach/Bowel: Normal stomach and small bowel. Colon is normal in
caliber. Mild generalized increase colonic stool burden. Diverticula
noted along the sigmoid colon. No diverticulitis, colonic wall
thickening or other inflammatory process.

Vascular/Lymphatic: Aortic atherosclerosis. No enlarged abdominal or
pelvic lymph nodes.

Reproductive: Status post hysterectomy. No adnexal masses.

Other: No abdominal wall hernia or abnormality. No abdominopelvic
ascites.

Musculoskeletal: No fracture or acute finding. No osteoblastic or
osteolytic lesions.
IMPRESSION: 1. No acute findings.  No bowel obstruction.
2. Mild generalized increase in the colonic stool burden. No bowel
inflammation.
3. Mild chronic intra and extrahepatic bile duct dilation stable
from the prior CT.
4. Bilateral renal cortical thinning.
5. Aortic atherosclerosis.

## 2021-12-06 ENCOUNTER — Telehealth: Payer: Self-pay

## 2021-12-06 NOTE — Progress Notes (Signed)
Chronic Care Management Pharmacy Assistant   Name: Morgan [name]  MRN: [id number] DOB: Aug 24, 1951  Reason for Encounter: CCM (COPD Disease State)   Recent office visits:  10/27/2021 - [name], MD - Patient presented for low blood pressure. Blood pressure reading in office was 118/78. Dr. Silvio Pate notes symptoms highly suggestive of primary PD. Referral place to Neurology. Change: carvedilol (COREG) 12.5 MG tablet - Take 12.5 mg 2 times daily vs. 6.25 mg 2 times daily. Stop by provider: aspirin 81 MG EC tablet. Stop as patient reported as not taking: isosorbide mononitrate (IMDUR) 30 MG 24 hr tablet. 10/18/2021 - [name], MD - Orders only - Start: metronidazole 500 mg tablet - Take 1 tablet by mouth 2 times daily for 7 days due to bacterial vaginosis. No other information.   Recent consult visits:  12/202/2022 [facility] - Patient presented for MR brain without contrast. No other information.  11/27/2021 - Jennings Books, MD - Neurology - Telephone - Patient was advised to continue taking Carbidopa/Levodopa - 1/2 tablet by mouth three times a day as she does not have side effects when taken this way. Neurology office agreed she can take it as she has been doing.  11/23/2021 - Serafina Royals, MD - Cardiology - Patient presented for four week follow up for congestive heart failure, hypertension and fatigue. No medication changes.  11/21/2021 - Jennings Books, MD - Neurology - Patient presented for primary parkinsonism. Ordered: MRI brain without contrast. Ordered: Sleep deprived EEG. Order: Physical Therapy. Start: Carbidopa/levodopa 25/100 mg - 1 tab three times a day. Per physician: Increase the dosage according to the schedule below as tolerated and needed. If significant and persistent side effects, cut the dose back to the previously tolerated level. Satisfactory results from one of the lower doses, you need not go higher. Morning Afternoon Evening Days 1 - 3 1/2 tab 1/2 tab 1/2 tab   Days 4 and beyond 1 tab 1 tab 1 tab. Recommended patient not drive due to "white-out" spells.  10/29/2021 - [facility] - Patient presented for urinary tract infection symptoms including dysuria and suprapubic pain. Start: nitrofurantoin, macrocrystal-monohydrate, (MACROBID) 100 MG capsule - Take 1 capsule by mouth 2 times daily.  10/26/2021 - Serafina Royals, MD - Cardiology - Telephone - Patient called she is fatigued and shaky since starting Carvedilol. Change: Take half a dose BID of Carvedilol due to side effects. 10/25/2021 - Serafina Royals, MD - Cardiology - Patient presented for 6 month follow up for dizziness and chest pain.   Hospital visits:  11/03/2021 [facility] - Patient presented for Colonoscopy and Endoscopy.  Endoscopy findings: No endoscopic abnormality was evident in the esophagus to explain the patient's complaint of dysphagia. Biopsies were obtained from the proximal and distal esophagus with cold forceps for histology of suspected eosinophilic esophagitis. A single papule (nodule) found in the stomach which was biopsied. Colonoscopy findings: The perianal and digital rectal examinations were normal. No medication changes. Discharged same day.   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 Adenomatous Polyps are the type that have the potential to turn into cancer but removing them prevents cancer.  Medications: Outpatient Encounter Medications as of 12/06/2021  Medication Sig   albuterol (VENTOLIN HFA) 108 (90 Base) MCG/ACT inhaler Inhale 1-2 puffs into the  lungs every 6 (six) hours as needed for wheezing or shortness of breath.   alendronate (FOSAMAX) 70 MG tablet Take 1 tablet (70 mg total) by mouth every 7 (seven)  days. Take with a full glass of water on an empty stomach.   aspirin-acetaminophen-caffeine (EXCEDRIN MIGRAINE) 250-250-65 MG tablet Take 1-2 tablets by mouth every 6 (six) hours as needed for headache.   atorvastatin (LIPITOR) 40 MG tablet Take 1 tablet (40 mg total) by mouth daily.   carvedilol (COREG) 12.5 MG tablet Take 12.5 mg by mouth 2 (two) times daily.   cetirizine (ZYRTEC ALLERGY) 10 MG tablet Take 1 tablet (10 mg total) by mouth daily.   FLUoxetine (PROZAC) 40 MG capsule Take 40 mg by mouth daily.   fluticasone-salmeterol (ADVAIR HFA) 115-21 MCG/ACT inhaler Inhale 2 puffs into the lungs 2 (two) times daily.   furosemide (LASIX) 40 MG tablet Take 1 tablet (40 mg total) by mouth daily.   gabapentin (NEURONTIN) 400 MG capsule Take 400 mg by mouth 3 (three) times daily as needed (for nerve pain).   hydrOXYzine (ATARAX/VISTARIL) 25 MG tablet Take 25 mg by mouth every 8 (eight) hours as needed for anxiety.   ibuprofen (ADVIL) 600 MG tablet Take 1 tablet (600 mg total) by mouth every 6 (six) hours as needed.   linaclotide (LINZESS) 145 MCG CAPS capsule Take 145 mcg by mouth daily before breakfast. (Patient not taking: Reported on 11/03/2021)   meloxicam (MOBIC) 7.5 MG tablet Take 7.5 mg by mouth daily.   mirtazapine (REMERON) 15 MG tablet Take 15 mg by mouth at bedtime.   nitrofurantoin, macrocrystal-monohydrate, (MACROBID) 100 MG capsule Take 1 capsule (100 mg total) by mouth 2 (two) times daily.   omeprazole (PRILOSEC) 40 MG capsule TAKE 1 CAPSULE BY MOUTH EVERY DAY (Patient taking differently: Take 80 mg by mouth daily.)   ondansetron (ZOFRAN-ODT) 4 MG disintegrating tablet Take 1-2 tablets by mouth every 6 hours as needed   OXYGEN Inhale 2 L/min into the lungs See admin instructions. 2 L/min at bedtime and as needed for shortness of breath throughout the day   polyethylene glycol-electrolytes (NULYTELY) 420 g solution Take 4,000 mLs by mouth as directed.   potassium chloride SA (KLOR-CON)  20 MEQ tablet Take 1 tablet (20 mEq total) by mouth daily. With furosemide   predniSONE (DELTASONE) 20 MG tablet Take 2 tablets daily with breakfast.   valsartan (DIOVAN) 320 MG tablet Take 320 mg by mouth daily.   No facility-administered encounter medications on file as of 12/06/2021.   Contacted patient to discuss COPD disease state  Patient stated she is doing well using the Advair inhaler. Patient is not having any side effects.    Current COPD regimen:  Albuterol HFA Advair HFA (fluticasone-salmeterol) 115-21 mcg/act 2 puff BID Cetirizine 10 mg daily Supplemental O2 (noctural)  Any recent hospitalizations or ED visits since last visit with CPP? No  denies COPD symptoms, including Increased shortness of breath , Rescue medicine is not helping, Shortness of breath at rest, Symptoms worse with exercise, Symptoms worse at night, and Wheezing   What recent interventions/DTPs have been made by any provider to improve breathing since last visit: Advised trial of Advair BID - if not tolerated, consider LAMA monotherapy (Spiriva, Incruse) by Charlene Brooke.   Have you had exacerbation/flare-up since last visit? No  What do you do when you are short of breath?  Rest  Current tobacco use? No  Respiratory Devices/Equipment Do you have a nebulizer? No Do you use  a Peak Flow Meter? No Do you use a maintenance inhaler? Yes -Advair HFA 115-21 mcg inhaler How often do you forget to use your daily inhaler? Daily Do you use a rescue inhaler? Yes - Albuterol (Ventolin HFA) 108 mcg How often do you use your rescue inhaler?  several times per month Do you use a spacer with your inhaler? No  Adherence Review: Does the patient have >5 day gap between last estimated fill date for maintenance inhaler medications? No - Advair HFA 115-21 mcg inhaler last filled on 11/14/2021 for 30ds  Infusion appointment on 12/21/2021 CCM appointment on 01/17/2022 at 3:30 pm with [facility]  Gaps: Annual wellness visit in last year? No Most Recent BP reading: 149/81 on 11/03/2021  [facility] Medication Name  Last Fill Date  Day supply Valsartan 320 mg  11/12/2021  90 Atorvastatin 40 mg  09/27/2021  [place], CPP notified  Marijean Niemann, [place] Pharmacy Assistant [phone]  Time Spent:  32 Minutes

## 2021-12-07 DIAGNOSIS — M6281 Muscle weakness (generalized): Secondary | ICD-10-CM | POA: Diagnosis not present

## 2021-12-07 DIAGNOSIS — R262 Difficulty in walking, not elsewhere classified: Secondary | ICD-10-CM | POA: Diagnosis not present

## 2021-12-07 DIAGNOSIS — R2681 Unsteadiness on feet: Secondary | ICD-10-CM | POA: Diagnosis not present

## 2021-12-07 DIAGNOSIS — M5451 Vertebrogenic low back pain: Secondary | ICD-10-CM | POA: Diagnosis not present

## 2021-12-12 ENCOUNTER — Other Ambulatory Visit: Payer: Self-pay | Admitting: *Deleted

## 2021-12-12 DIAGNOSIS — N1831 Chronic kidney disease, stage 3a: Secondary | ICD-10-CM

## 2021-12-12 DIAGNOSIS — D508 Other iron deficiency anemias: Secondary | ICD-10-CM

## 2021-12-13 DIAGNOSIS — M6281 Muscle weakness (generalized): Secondary | ICD-10-CM | POA: Diagnosis not present

## 2021-12-13 DIAGNOSIS — R2681 Unsteadiness on feet: Secondary | ICD-10-CM | POA: Diagnosis not present

## 2021-12-13 DIAGNOSIS — R262 Difficulty in walking, not elsewhere classified: Secondary | ICD-10-CM | POA: Diagnosis not present

## 2021-12-13 DIAGNOSIS — M5451 Vertebrogenic low back pain: Secondary | ICD-10-CM | POA: Diagnosis not present

## 2021-12-16 DIAGNOSIS — R569 Unspecified convulsions: Secondary | ICD-10-CM | POA: Diagnosis not present

## 2021-12-17 IMAGING — CT CT ABD-PELV W/ CM
2 of 5 series · 17 of 46 positions shown, 19 images · IV contrast (omnipaque)
Comparison: None.

CLINICAL DATA: Suspected diverticulitis

EXAM:
CT ABDOMEN AND PELVIS WITH CONTRAST
TECHNIQUE: Multidetector CT imaging of the abdomen and pelvis was performed
using the standard protocol following bolus administration of
intravenous contrast.
CONTRAST:  100mL OMNIPAQUE IOHEXOL 300 MG/ML  SOLN

[Series 2: abd pelvis 5.00 · axial · 0.97mm/px · z∈[-1544,-1119]mm · 14 of 97 slices shown, 16 images]
[im 6/97  soft-tissue]
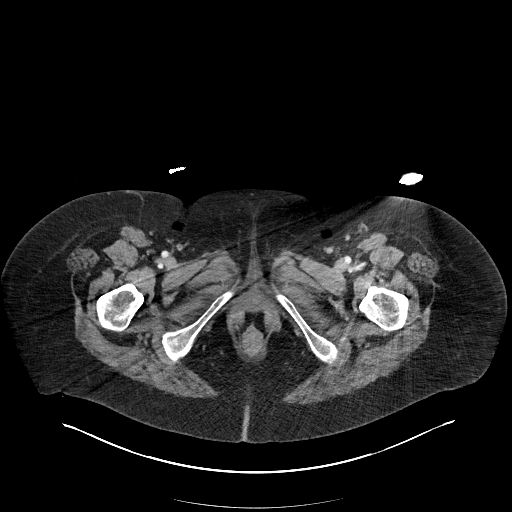
[im 6/97  bone]
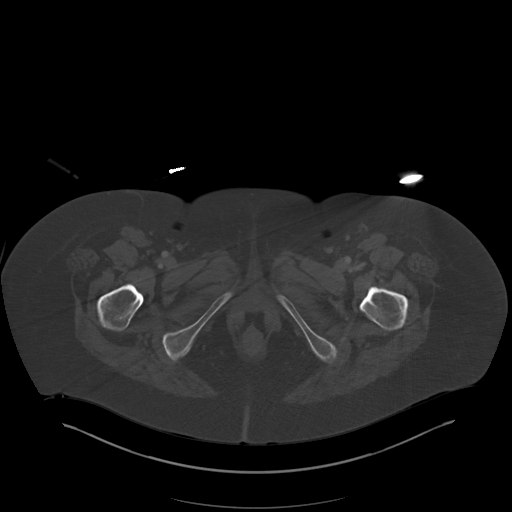
[im 11/97  soft-tissue]
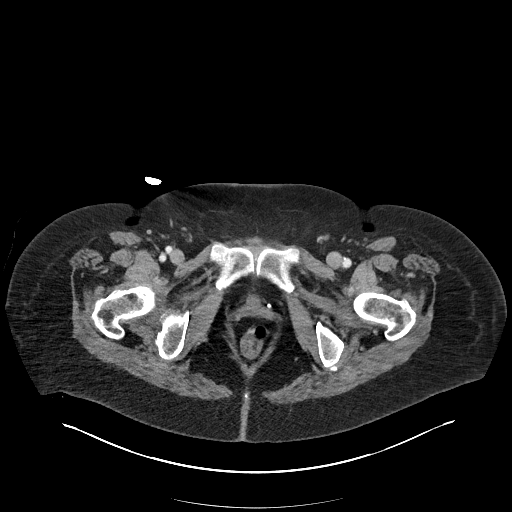
[im 21/97  soft-tissue]
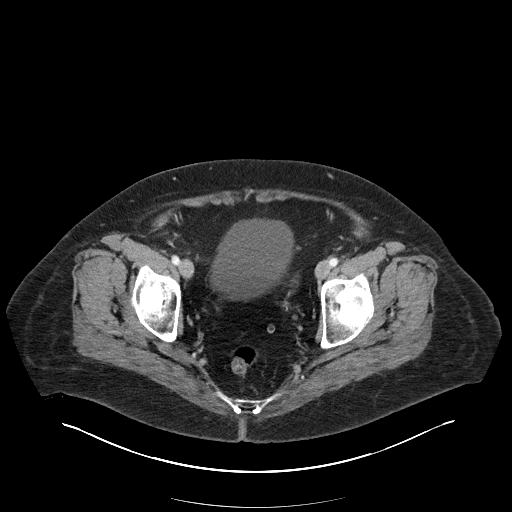
[im 26/97  soft-tissue]
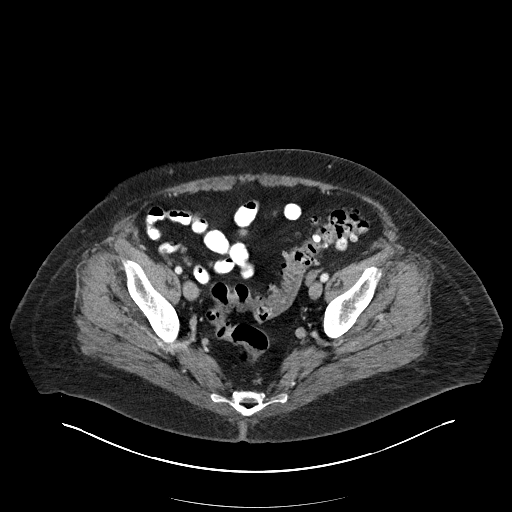
[im 31/97  soft-tissue]
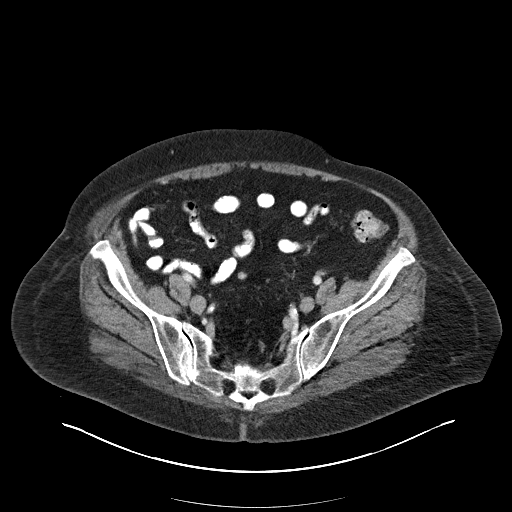
[im 41/97  soft-tissue]
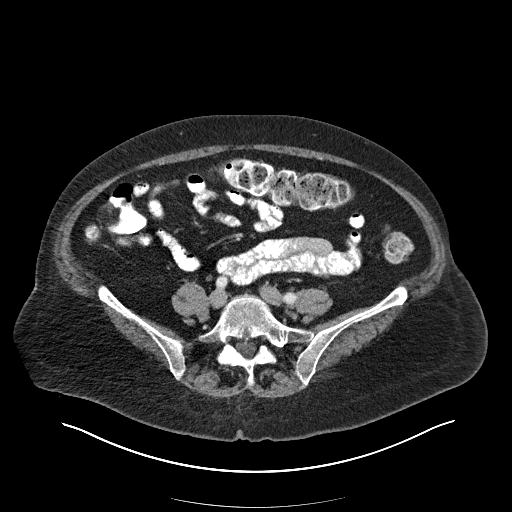
[im 46/97  soft-tissue]
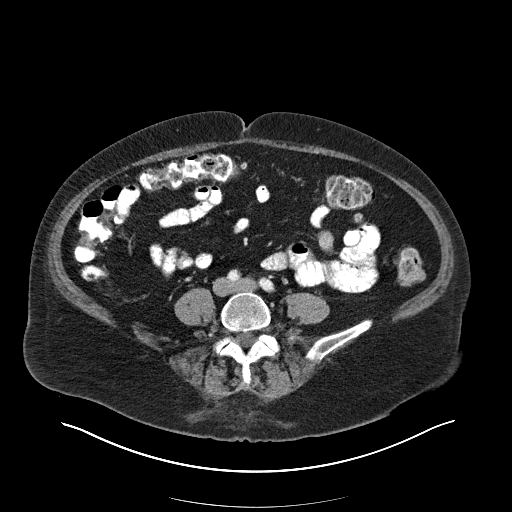
[im 51/97  soft-tissue]
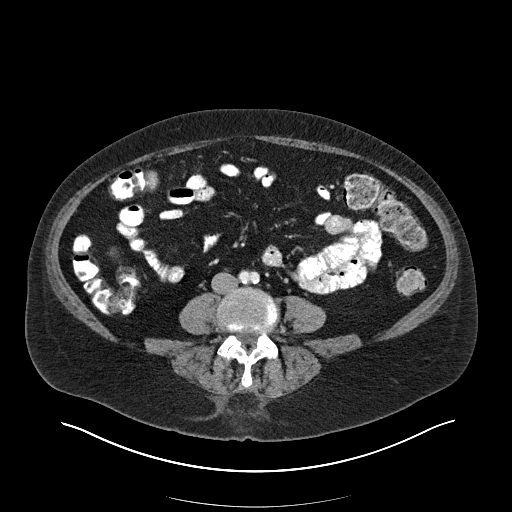
[im 56/97  soft-tissue]
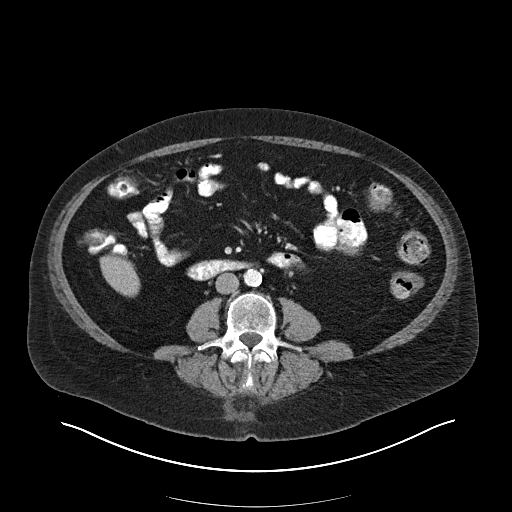
[im 56/97  bone]
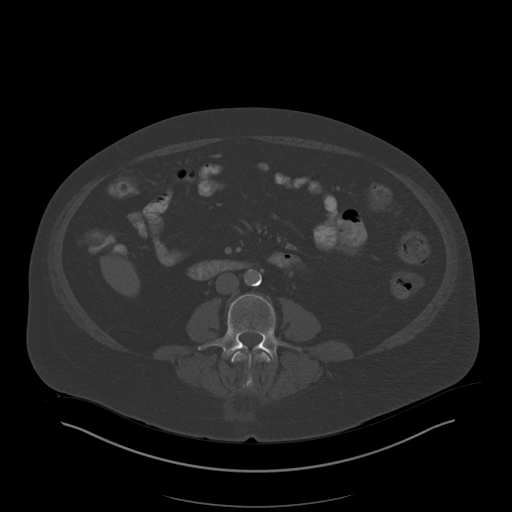
[im 66/97  soft-tissue]
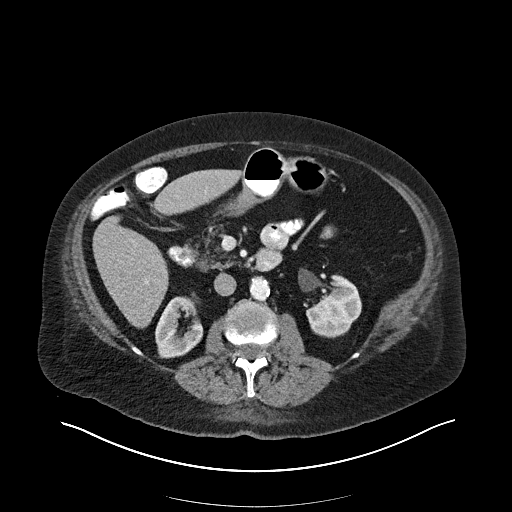
[im 71/97  soft-tissue]
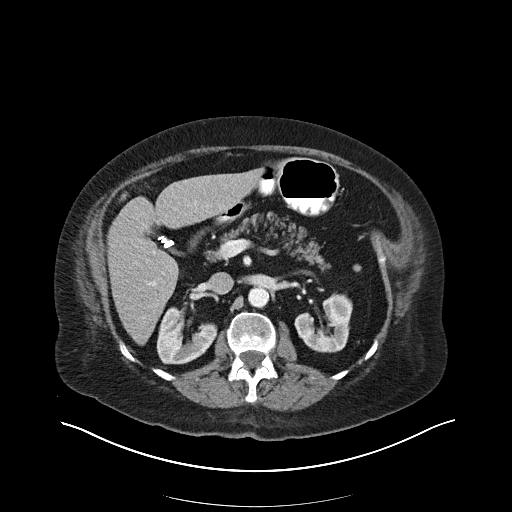
[im 76/97  soft-tissue]
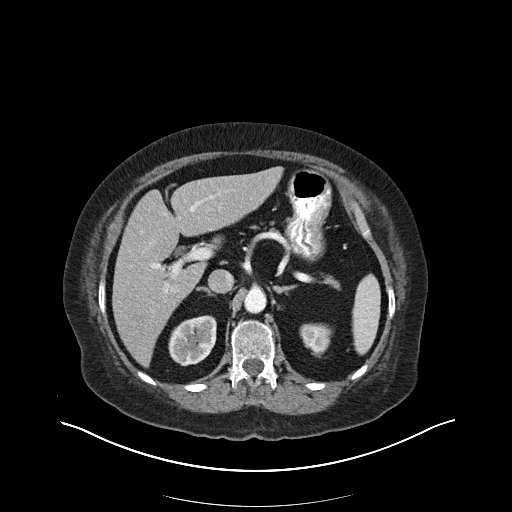
[im 86/97  soft-tissue]
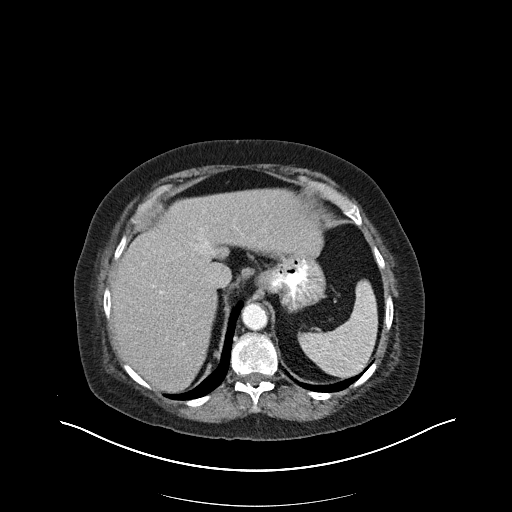
[im 91/97  soft-tissue]
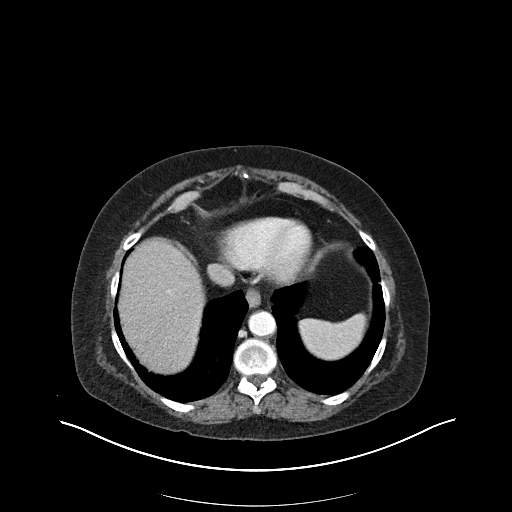

[Series 4: coronals abd pelvis 2.00 cor · coronal · 0.95mm/px · 3 of 165 slices shown]
[im 55/165  soft-tissue]
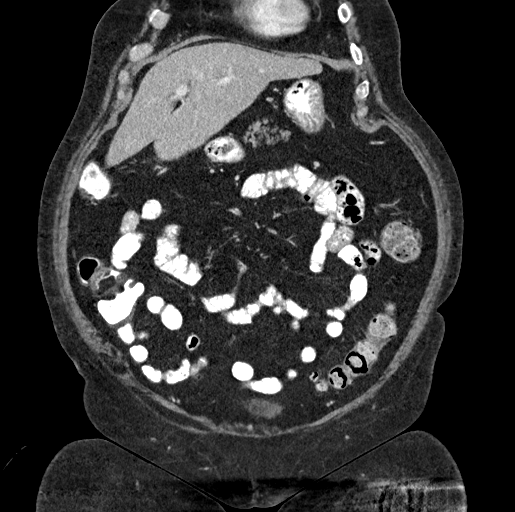
[im 73/165  soft-tissue]
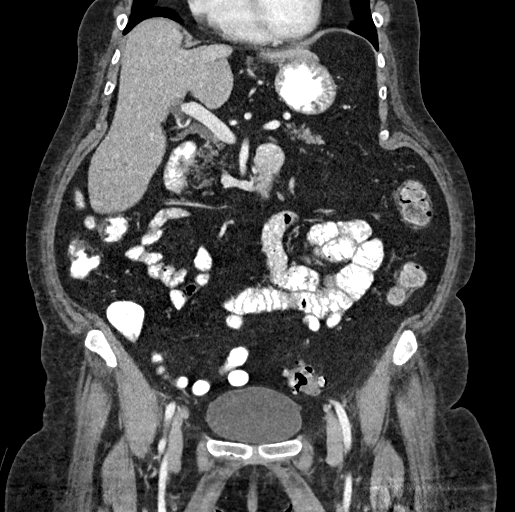
[im 92/165  soft-tissue]
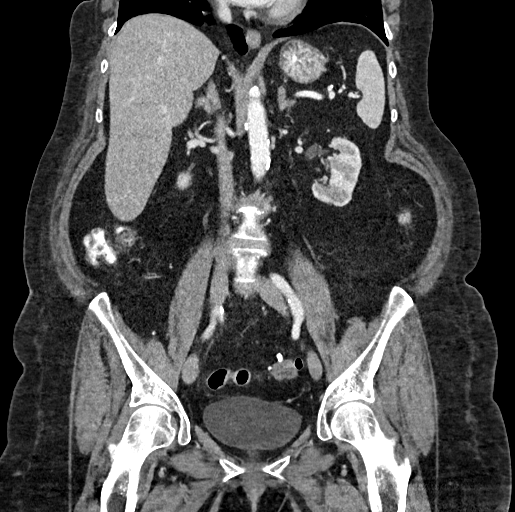

[17 of 46 positions shown; findings below may reference images not displayed]

FINDINGS: Lower chest: Lung bases are clear.

Hepatobiliary: No focal hepatic lesion. Postcholecystectomy. No
biliary dilatation.

Pancreas: Pancreas is normal. No ductal dilatation. No pancreatic
inflammation.

Spleen: Normal spleen

Adrenals/urinary tract: Adrenal glands and kidneys are normal. The
ureters and bladder normal.

Stomach/Bowel: Stomach, small-bowel and cecum are normal. The
appendix is not identified but there is no pericecal inflammation to
suggest appendicitis. There are diverticula the distal descending
colon multiple diverticula sigmoid colon. There is potential mild
fatty inflammation along the mesenteric border of the sigmoid colon
(image 82/4.) This is subtle finding.

Vascular/Lymphatic: Abdominal aorta is normal caliber with
atherosclerotic calcification. There is no retroperitoneal or
periportal lymphadenopathy. No pelvic lymphadenopathy.

Reproductive: Post hysterectomy.  Adnexa unremarkable

Other: No free fluid.

Musculoskeletal: No aggressive osseous lesion.
IMPRESSION: 1. Potential very mild acute diverticulitis sigmoid colon. Subtle
finding.
2. Postcholecystectomy.  Post hysterectomy.

## 2021-12-19 ENCOUNTER — Inpatient Hospital Stay: Payer: Medicare HMO | Attending: Oncology

## 2021-12-19 ENCOUNTER — Other Ambulatory Visit: Payer: Self-pay | Admitting: Family Medicine

## 2021-12-19 ENCOUNTER — Other Ambulatory Visit: Payer: Self-pay

## 2021-12-19 DIAGNOSIS — I10 Essential (primary) hypertension: Secondary | ICD-10-CM | POA: Diagnosis not present

## 2021-12-19 DIAGNOSIS — K5900 Constipation, unspecified: Secondary | ICD-10-CM | POA: Diagnosis not present

## 2021-12-19 DIAGNOSIS — Z87891 Personal history of nicotine dependence: Secondary | ICD-10-CM | POA: Insufficient documentation

## 2021-12-19 DIAGNOSIS — N189 Chronic kidney disease, unspecified: Secondary | ICD-10-CM | POA: Diagnosis not present

## 2021-12-19 DIAGNOSIS — Z791 Long term (current) use of non-steroidal anti-inflammatories (NSAID): Secondary | ICD-10-CM | POA: Diagnosis not present

## 2021-12-19 DIAGNOSIS — E876 Hypokalemia: Secondary | ICD-10-CM

## 2021-12-19 DIAGNOSIS — K219 Gastro-esophageal reflux disease without esophagitis: Secondary | ICD-10-CM | POA: Diagnosis not present

## 2021-12-19 DIAGNOSIS — Z803 Family history of malignant neoplasm of breast: Secondary | ICD-10-CM | POA: Diagnosis not present

## 2021-12-19 DIAGNOSIS — Z8041 Family history of malignant neoplasm of ovary: Secondary | ICD-10-CM | POA: Diagnosis not present

## 2021-12-19 DIAGNOSIS — D509 Iron deficiency anemia, unspecified: Secondary | ICD-10-CM | POA: Diagnosis not present

## 2021-12-19 DIAGNOSIS — D508 Other iron deficiency anemias: Secondary | ICD-10-CM | POA: Insufficient documentation

## 2021-12-19 DIAGNOSIS — Z79899 Other long term (current) drug therapy: Secondary | ICD-10-CM | POA: Diagnosis not present

## 2021-12-19 DIAGNOSIS — G20 Parkinson's disease: Secondary | ICD-10-CM | POA: Insufficient documentation

## 2021-12-19 DIAGNOSIS — I341 Nonrheumatic mitral (valve) prolapse: Secondary | ICD-10-CM | POA: Insufficient documentation

## 2021-12-19 DIAGNOSIS — N1831 Chronic kidney disease, stage 3a: Secondary | ICD-10-CM

## 2021-12-19 DIAGNOSIS — I130 Hypertensive heart and chronic kidney disease with heart failure and stage 1 through stage 4 chronic kidney disease, or unspecified chronic kidney disease: Secondary | ICD-10-CM | POA: Insufficient documentation

## 2021-12-19 DIAGNOSIS — I509 Heart failure, unspecified: Secondary | ICD-10-CM | POA: Diagnosis not present

## 2021-12-19 LAB — CBC WITH DIFFERENTIAL/PLATELET
Abs Immature Granulocytes: 0.03 10*3/uL (ref 0.00–0.07)
Basophils Absolute: 0 10*3/uL (ref 0.0–0.1)
Basophils Relative: 0 %
Eosinophils Absolute: 0.2 10*3/uL (ref 0.0–0.5)
Eosinophils Relative: 2 %
HCT: 34 % — ABNORMAL LOW (ref 36.0–46.0)
Hemoglobin: 10.7 g/dL — ABNORMAL LOW (ref 12.0–15.0)
Immature Granulocytes: 0 %
Lymphocytes Relative: 22 %
Lymphs Abs: 2.1 10*3/uL (ref 0.7–4.0)
MCH: 26 pg (ref 26.0–34.0)
MCHC: 31.5 g/dL (ref 30.0–36.0)
MCV: 82.5 fL (ref 80.0–100.0)
Monocytes Absolute: 0.5 10*3/uL (ref 0.1–1.0)
Monocytes Relative: 5 %
Neutro Abs: 6.6 10*3/uL (ref 1.7–7.7)
Neutrophils Relative %: 71 %
Platelets: 314 10*3/uL (ref 150–400)
RBC: 4.12 MIL/uL (ref 3.87–5.11)
RDW: 18.1 % — ABNORMAL HIGH (ref 11.5–15.5)
WBC: 9.4 10*3/uL (ref 4.0–10.5)
nRBC: 0 % (ref 0.0–0.2)

## 2021-12-19 LAB — IRON AND TIBC
Iron: 57 ug/dL (ref 28–170)
Saturation Ratios: 19 % (ref 10.4–31.8)
TIBC: 300 ug/dL (ref 250–450)
UIBC: 243 ug/dL

## 2021-12-19 LAB — FERRITIN: Ferritin: 187 ng/mL (ref 11–307)

## 2021-12-21 ENCOUNTER — Inpatient Hospital Stay: Payer: Medicare HMO

## 2021-12-21 ENCOUNTER — Other Ambulatory Visit: Payer: Self-pay

## 2021-12-21 ENCOUNTER — Encounter: Payer: Self-pay | Admitting: Oncology

## 2021-12-21 ENCOUNTER — Inpatient Hospital Stay: Payer: Medicare HMO | Admitting: Oncology

## 2021-12-21 VITALS — BP 134/76 | HR 85 | Temp 97.0°F | Resp 18 | Wt 226.2 lb

## 2021-12-21 VITALS — BP 107/62 | HR 72

## 2021-12-21 DIAGNOSIS — K5900 Constipation, unspecified: Secondary | ICD-10-CM | POA: Diagnosis not present

## 2021-12-21 DIAGNOSIS — D509 Iron deficiency anemia, unspecified: Secondary | ICD-10-CM | POA: Diagnosis not present

## 2021-12-21 DIAGNOSIS — N189 Chronic kidney disease, unspecified: Secondary | ICD-10-CM | POA: Diagnosis not present

## 2021-12-21 DIAGNOSIS — I130 Hypertensive heart and chronic kidney disease with heart failure and stage 1 through stage 4 chronic kidney disease, or unspecified chronic kidney disease: Secondary | ICD-10-CM | POA: Diagnosis not present

## 2021-12-21 DIAGNOSIS — G20 Parkinson's disease: Secondary | ICD-10-CM | POA: Diagnosis not present

## 2021-12-21 DIAGNOSIS — D508 Other iron deficiency anemias: Secondary | ICD-10-CM

## 2021-12-21 DIAGNOSIS — I10 Essential (primary) hypertension: Secondary | ICD-10-CM | POA: Diagnosis not present

## 2021-12-21 DIAGNOSIS — I509 Heart failure, unspecified: Secondary | ICD-10-CM | POA: Diagnosis not present

## 2021-12-21 DIAGNOSIS — N1831 Chronic kidney disease, stage 3a: Secondary | ICD-10-CM | POA: Diagnosis not present

## 2021-12-21 DIAGNOSIS — Z79899 Other long term (current) drug therapy: Secondary | ICD-10-CM | POA: Diagnosis not present

## 2021-12-21 DIAGNOSIS — K219 Gastro-esophageal reflux disease without esophagitis: Secondary | ICD-10-CM | POA: Diagnosis not present

## 2021-12-21 DIAGNOSIS — R768 Other specified abnormal immunological findings in serum: Secondary | ICD-10-CM

## 2021-12-21 MED ORDER — SODIUM CHLORIDE 0.9 % IV SOLN: 200.0000 mg | Freq: Once | INTRAVENOUS | Status: DC

## 2021-12-21 MED ORDER — SODIUM CHLORIDE 0.9 % IV SOLN
Freq: Once | INTRAVENOUS | Status: AC
  Filled 2021-12-21: qty 250

## 2021-12-21 MED ORDER — IRON SUCROSE 20 MG/ML IV SOLN
200.0000 mg | Freq: Once | INTRAVENOUS | Status: AC
  Administered 2021-12-21: 200 mg via INTRAVENOUS
  Filled 2021-12-21: qty 10

## 2021-12-21 NOTE — Progress Notes (Signed)
Pt here for follow up. No new concerns voiced.   

## 2021-12-21 NOTE — Progress Notes (Signed)
Hematology/Oncology progress note [facility] [phone] [phone]   Patient Care Team: [name], MD as PCP - General (Family Medicine) Charlton Haws, [facility] as Pharmacist (Pharmacist) [name], MD as Consulting Physician (Hematology and Oncology)  REFERRING PROVIDER: [name], MD  CHIEF COMPLAINTS/REASON FOR VISIT:  Follow-up for iron deficiency anemia, CKD.  HISTORY OF PRESENTING ILLNESS:   Morgan [name] is a  71 y.o.  female with PMH listed below was seen in consultation at the request of  [name], MD  for evaluation of anemia Patient had blood work done recently on 08/29/2021. CBC showed a hemoglobin of 9.9, MCV 75.5. Patient denies any black stool or blood in the stool. She has a history of gastric stenosis for which she got EGD dilation periodically.  Last EGD was 08/11/2020. Patient reports feeling tired and fatigued. She has taken oral iron supplementation before.  Iron has made her constipation worse.  INTERVAL HISTORY Morgan [name] is a 71 y.o. female who has above history reviewed by me today presents for follow up visit for management of iron deficiency anemia and CKD.  She reports feeling well today. Tolerated IV venofer.  No new complaints.    Review of Systems  Constitutional:  Positive for fatigue. Negative for appetite change, chills and fever.  HENT:   Negative for hearing loss and voice change.   Eyes:  Negative for eye problems.  Respiratory:  Negative for chest tightness and cough.   Cardiovascular:  Negative for chest pain.  Gastrointestinal:  Negative for abdominal distention, abdominal pain and blood in stool.  Endocrine: Negative for hot flashes.  Genitourinary:  Negative for difficulty urinating and frequency.   Musculoskeletal:  Negative for arthralgias.  Skin:  Negative for itching and rash.  Neurological:  Negative for extremity weakness.  Hematological:  Negative for  adenopathy.  Psychiatric/Behavioral:  Negative for confusion.    MEDICAL HISTORY:  Past Medical History:  Diagnosis Date   Allergy    Anemia    Anxiety    Arthritis    CHF (congestive heart failure) (HCC)    Depression    GERD (gastroesophageal reflux disease)    Hypertension    MVP (mitral valve prolapse)     SURGICAL HISTORY: Past Surgical History:  Procedure Laterality Date   ABDOMINAL HYSTERECTOMY     APPENDECTOMY     BALLOON DILATION N/A 04/21/2020   Procedure: BALLOON DILATION;  Surgeon: Milus Banister, MD;  Location: [facility];  Service: Endoscopy;  Laterality: N/A;  pyloric/ GI   BALLOON DILATION N/A 07/04/2020   Procedure: BALLOON DILATION;  Surgeon: Mauri Pole, MD;  Location: [facility];  Service: Endoscopy;  Laterality: N/A;   BIOPSY  04/21/2020   Procedure: BIOPSY;  Surgeon: Milus Banister, MD;  Location: [facility];  Service: Endoscopy;;   BIOPSY  07/04/2020   Procedure: BIOPSY;  Surgeon: Mauri Pole, MD;  Location: [facility];  Service: Endoscopy;;  EGD and COLON   BREAST SURGERY Right 1988   tumor removal   CHOLECYSTECTOMY N/A 05/18/2020   Procedure: LAPAROSCOPIC CHOLECYSTECTOMY;  Surgeon: Johnathan Hausen, MD;  Location: [facility];  Service: General;  Laterality: N/A;   COLONOSCOPY WITH PROPOFOL N/A 07/04/2020   Procedure: COLONOSCOPY WITH PROPOFOL;  Surgeon: Mauri Pole, MD;  Location: [facility];  Service: Endoscopy;  Laterality: N/A;   COLONOSCOPY WITH PROPOFOL N/A 11/03/2021   Procedure: COLONOSCOPY WITH PROPOFOL;  Surgeon: Lesly Rubenstein, MD;  Location: [facility]  ENDOSCOPY;  Service: Endoscopy;  Laterality: N/A;   ESOPHAGEAL DILATION  08/11/2020   Procedure: PYLORIC DILATION;  Surgeon: Doran Stabler, MD;  Location: [facility];  Service: Gastroenterology;;   ESOPHAGOGASTRODUODENOSCOPY (EGD) WITH PROPOFOL N/A 04/21/2020   Procedure: ESOPHAGOGASTRODUODENOSCOPY (EGD) WITH PROPOFOL;  Surgeon: Milus Banister, MD;  Location: [facility];  Service: Endoscopy;  Laterality: N/A;   ESOPHAGOGASTRODUODENOSCOPY (EGD) WITH PROPOFOL N/A 07/04/2020   Procedure: ESOPHAGOGASTRODUODENOSCOPY (EGD) WITH PROPOFOL;  Surgeon: Mauri Pole, MD;  Location: [facility];  Service: Endoscopy;  Laterality: N/A;   ESOPHAGOGASTRODUODENOSCOPY (EGD) WITH PROPOFOL N/A 08/11/2020   Procedure: ESOPHAGOGASTRODUODENOSCOPY (EGD) WITH PROPOFOL;  Surgeon: Doran Stabler, MD;  Location: [facility];  Service: Gastroenterology;  Laterality: N/A;   ESOPHAGOGASTRODUODENOSCOPY (EGD) WITH PROPOFOL N/A 11/03/2021   Procedure: ESOPHAGOGASTRODUODENOSCOPY (EGD) WITH PROPOFOL;  Surgeon: Lesly Rubenstein, MD;  Location: [facility] END[facility]COPY;  Service: Endoscopy;  Laterality: N/A;   KNEE SURGERY  2005   2 times left   KNEE SURGERY Right    KYPHOPLASTY N/A 07/20/2021   Procedure: T 10KYPHOPLASTY;  Surgeon: Hessie Knows, MD;  Location: [facility];  Service: Orthopedics;  Laterality: N/A;   KYPHOPLASTY N/A 08/23/2021   Procedure: T9 KYPHOPLASTY;  Surgeon: Hessie Knows, MD;  Location: [facility];  Service: Orthopedics;  Laterality: N/A;   POLYPECTOMY  07/04/2020   Procedure: POLYPECTOMY;  Surgeon: Mauri Pole, MD;  Location: [facility];  Service: Endoscopy;;   TUBAL LIGATION      SOCIAL HISTORY: Social History   Socioeconomic History   Marital status: Single    Spouse name: Not on file   Number of children: 2   Years of education: Not on file   Highest education level: Not on file  Occupational History   Not on file  Tobacco Use   Smoking status: Former    Packs/day: 1.00    Years: 15.00    Pack years: 15.00    Types: Cigarettes    Quit date: 09/12/2017    Years since quitting: 4.2   Smokeless tobacco: Never  Vaping Use   Vaping Use: Never used  Substance and Sexual Activity   Alcohol use: Not Currently   Drug use: No   Sexual activity: Not Currently  Other Topics Concern   Not on file  Social History Narrative   Lives with  Grandson's great grandparents    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)   Daughter - Morgan [name] - lives in the [place] system - family, aunt, Morgan [name] and Morgan [name] (who she lives with), brother   Transportation: roommates   Enjoy: [employer], playing with grandson   Exercise: PT exercises   Diet: good - chicken, some steak, tries to eat healthy, avoids fried foods   Retired from being a Quarry manager   Social Determinants of Radio broadcast assistant Strain: Not on Art therapist Insecurity: Not on file  Transportation Needs: Not on file  Physical Activity: Not on file  Stress: Not on file  Social Connections: Not on file  Intimate Partner Violence: Not on file    FAMILY HISTORY: Family History  Problem Relation Age of Onset   Breast cancer Mother 29   Ovarian cancer Mother 7   Brain cancer Mother    Hypertension Father    Hyperlipidemia Father    Heart disease Father    Colon cancer Father        diagnosed in his 67s   Breast cancer Maternal Grandmother 94  Diabetes Paternal Grandmother    Breast cancer Paternal Grandmother 31   Other Son        drug over dose   Colon cancer Maternal Grandfather        dx in his 64s   Liver disease Maternal Grandfather    Breast cancer Maternal Aunt 60   Breast cancer Maternal Aunt 60   Colon cancer Maternal Uncle    Stomach cancer Neg Hx    Pancreatic cancer Neg Hx    Esophageal cancer Neg Hx     ALLERGIES:  is allergic to meperidine, strawberry extract, carafate [sucralfate], and wasp venom.  MEDICATIONS:  Current Outpatient Medications  Medication Sig Dispense Refill   albuterol (VENTOLIN HFA) 108 (90 Base) MCG/ACT inhaler Inhale 1-2 puffs into the lungs every 6 (six) hours as needed for wheezing or shortness of breath. 18 g 0   alendronate (FOSAMAX) 70 MG tablet Take 1 tablet (70 mg total) by mouth every 7 (seven) days. Take with a full glass of water on an empty stomach. 4 tablet 11   aspirin-acetaminophen-caffeine (EXCEDRIN  MIGRAINE) 250-250-65 MG tablet Take 1-2 tablets by mouth every 6 (six) hours as needed for headache.     atorvastatin (LIPITOR) 40 MG tablet Take 1 tablet (40 mg total) by mouth daily. 90 tablet 1   carvedilol (COREG) 12.5 MG tablet Take 12.5 mg by mouth 2 (two) times daily.     cetirizine (ZYRTEC ALLERGY) 10 MG tablet Take 1 tablet (10 mg total) by mouth daily. 30 tablet 0   FLUoxetine (PROZAC) 40 MG capsule Take 40 mg by mouth daily.     fluticasone-salmeterol (ADVAIR HFA) 115-21 MCG/ACT inhaler Inhale 2 puffs into the lungs 2 (two) times daily. 1 each 2   furosemide (LASIX) 40 MG tablet Take 1 tablet (40 mg total) by mouth daily. 30 tablet 0   gabapentin (NEURONTIN) 400 MG capsule Take 400 mg by mouth 3 (three) times daily as needed (for nerve pain).     hydrOXYzine (ATARAX/VISTARIL) 25 MG tablet Take 25 mg by mouth every 8 (eight) hours as needed for anxiety.     ibuprofen (ADVIL) 600 MG tablet Take 1 tablet (600 mg total) by mouth every 6 (six) hours as needed. 30 tablet 0   meloxicam (MOBIC) 7.5 MG tablet Take 7.5 mg by mouth daily.     mirtazapine (REMERON) 15 MG tablet Take 15 mg by mouth at bedtime.     nitrofurantoin, macrocrystal-monohydrate, (MACROBID) 100 MG capsule Take 1 capsule (100 mg total) by mouth 2 (two) times daily. 10 capsule 0   omeprazole (PRILOSEC) 40 MG capsule TAKE 1 CAPSULE BY MOUTH EVERY DAY (Patient taking differently: Take 80 mg by mouth daily.) 90 capsule 3   ondansetron (ZOFRAN-ODT) 4 MG disintegrating tablet Take 1-2 tablets by mouth every 6 hours as needed 360 tablet 3   OXYGEN Inhale 2 L/min into the lungs See admin instructions. 2 L/min at bedtime and as needed for shortness of breath throughout the day     polyethylene glycol-electrolytes (NULYTELY) 420 g solution Take 4,000 mLs by mouth as directed.     potassium chloride SA (KLOR-CON) 20 MEQ tablet Take 1 tablet (20 mEq total) by mouth daily. With furosemide 30 tablet 0   predniSONE (DELTASONE) 20 MG tablet  Take 2 tablets daily with breakfast. 10 tablet 0   valsartan (DIOVAN) 320 MG tablet Take 320 mg by mouth daily.     carbidopa-levodopa (SINEMET IR) 25-100 MG tablet Take 1 tablet by  mouth 3 (three) times daily.     linaclotide (LINZESS) 145 MCG CAPS capsule Take 145 mcg by mouth daily before breakfast. (Patient not taking: Reported on 11/03/2021)     No current facility-administered medications for this visit.     PHYSICAL EXAMINATION: ECOG PERFORMANCE STATUS: 1 - Symptomatic but completely ambulatory Vitals:   12/21/21 1407  BP: 134/76  Pulse: 85  Resp: 18  Temp: (!) 97 F (36.1 C)   Filed Weights   12/21/21 1407  Weight: 226 lb 3.2 oz (102.6 kg)    Physical Exam Constitutional:      General: She is not in acute distress. HENT:     Head: Normocephalic and atraumatic.  Eyes:     General: No scleral icterus. Cardiovascular:     Rate and Rhythm: Normal rate and regular rhythm.     Heart sounds: Normal heart sounds.  Pulmonary:     Effort: Pulmonary effort is normal. No respiratory distress.     Breath sounds: No wheezing.  Abdominal:     General: Bowel sounds are normal. There is no distension.     Palpations: Abdomen is soft.  Musculoskeletal:        General: No deformity. Normal range of motion.     Cervical back: Normal range of motion and neck supple.  Skin:    General: Skin is warm and dry.     Findings: No erythema or rash.  Neurological:     Mental Status: She is alert and oriented to person, place, and time. Mental status is at baseline.     Cranial Nerves: No cranial nerve deficit.     Coordination: Coordination normal.  Psychiatric:        Mood and Affect: Mood normal.    LABORATORY DATA:  I have reviewed the data as listed Lab Results  Component Value Date   WBC 9.4 12/19/2021   HGB 10.7 (L) 12/19/2021   HCT 34.0 (L) 12/19/2021   MCV 82.5 12/19/2021   PLT 314 12/19/2021   Recent Labs    08/29/21 0931 10/05/21 1609 10/06/21 0439  10/06/21 1343 10/07/21 0431 10/17/21 1448  NA 141 140 141  --  142 141  K 3.2* 2.4* <2.0* 3.0* 3.5 4.6  CL 93* 93* 96*  --  101 101  CO2 39* 33* 36*  --  34* 31  GLUCOSE 116* 111* 97  --  103* 99  BUN 16 17 16   --  14 23  CREATININE 1.01 1.02* 0.96  --  0.87 1.35*  CALCIUM 9.0 8.0* 7.7*  --  8.3* 9.4  GFRNONAA  --  60* >60  --  >60  --   PROT 6.5 6.6 6.0*  --   --   --   ALBUMIN 3.5 3.0* 2.8*  --   --   --   AST 15 23 16   --   --   --   ALT 9 14 10   --   --   --   ALKPHOS 152* 109 98  --   --   --   BILITOT 0.5 0.8 0.6  --   --   --     Iron/TIBC/Ferritin/ %Sat    Component Value Date/Time   IRON 57 12/19/2021 1336   TIBC 300 12/19/2021 1336   FERRITIN 187 12/19/2021 1336   IRONPCTSAT 19 12/19/2021 1336       RADIOGRAPHIC STUDIES: I have personally reviewed the radiological images as listed and agreed with the findings in the  report. MR BRAIN WO CONTRAST  Result Date: 12/05/2021 CLINICAL DATA:  Provided history: Primary parkinsonism. Additional history provided by scanning technologist: Patient reports recently diagnosed Parkinson's disease, tremor. EXAM: MRI HEAD WITHOUT CONTRAST TECHNIQUE: Multiplanar, multiecho pulse sequences of the brain and surrounding structures were obtained without intravenous contrast. COMPARISON:  Prior head CT examinations 07/01/2021 and earlier. MRI brain and MRA head 08/23/2018. FINDINGS: Brain: Cerebral volume is unremarkable for age. Mild multifocal T2 FLAIR hyperintense signal abnormality within the cerebral white matter, nonspecific but compatible with chronic small vessel ischemic disease. There is no acute infarct. No evidence of an intracranial mass. No chronic intracranial blood products. No extra-axial fluid collection. No midline shift. Vascular: Maintained flow voids within the proximal large arterial vessels. Skull and upper cervical spine: No focal suspicious marrow lesion. Sinuses/Orbits: Visualized orbits show no acute finding. Small  mucous retention cyst within the left maxillary sinus. Trace mucosal thickening within the bilateral ethmoid air cells. IMPRESSION: No evidence of acute intracranial abnormality. Mild cerebral white matter chronic small vessel ischemic disease, not significantly changed from the brain MRI of 08/23/2018. Otherwise unremarkable non-contrast MRI appearance of the brain for age. Mild paranasal sinus disease, as described. Electronically Signed   By: Kellie Simmering D.O.   On: 12/05/2021 15:10      ASSESSMENT & PLAN:  1. Stage 3a chronic kidney disease (HCC)   2. Elevated serum immunoglobulin free light chain level   3. Other iron deficiency anemia    #Iron deficiency anemia in the context of chronic kidney disease.  Labs are reviewed and discussed with patient. Iron panel has improved and also hemoglobin improved.  Recommend patient to proceed with another dose of IV venofer, goal of ferritin >200.  Hb >10, no need for erythropoitein therapy.    Chronic kidney disease, SPEP showed no M protein.  Patient has increased free light chain levels with normal light chain ratio.  Check 24 hour UPEP She is interested in being referred to nephrology. Will refer.    Orders Placed This Encounter  Procedures   IFE+PROTEIN ELECTRO, 24-HR UR    Standing Status:   Future    Standing Expiration Date:   12/21/2022   CBC with Differential/Platelet    Standing Status:   Future    Standing Expiration Date:   12/21/2022   Comprehensive metabolic panel    Standing Status:   Future    Standing Expiration Date:   12/21/2022   Ferritin    Standing Status:   Future    Standing Expiration Date:   12/21/2022   Iron and TIBC    Standing Status:   Future    Standing Expiration Date:   12/21/2022    All questions were answered. The patient knows to call the clinic with any problems questions or concerns.   [name], MD    Return of visit: 3 months MD +/- Venofer.   [name], MD, PhD  12/21/2021

## 2021-12-21 NOTE — Patient Instructions (Signed)

## 2021-12-22 ENCOUNTER — Telehealth: Payer: Self-pay

## 2021-12-22 DIAGNOSIS — N1831 Chronic kidney disease, stage 3a: Secondary | ICD-10-CM

## 2021-12-22 NOTE — Telephone Encounter (Signed)
Referral faxed to [facility] for pt to establish care with Dr. Candiss Norse.   Ph: [phone] Fax: [phone]

## 2021-12-22 NOTE — Telephone Encounter (Signed)
-----   Message from [name], MD sent at 12/21/2021  9:20 PM EST ----- Please refer her to nephrology Dr.Singh, CKD

## 2021-12-24 DIAGNOSIS — I509 Heart failure, unspecified: Secondary | ICD-10-CM | POA: Diagnosis not present

## 2021-12-24 DIAGNOSIS — I130 Hypertensive heart and chronic kidney disease with heart failure and stage 1 through stage 4 chronic kidney disease, or unspecified chronic kidney disease: Secondary | ICD-10-CM | POA: Diagnosis not present

## 2021-12-24 DIAGNOSIS — D509 Iron deficiency anemia, unspecified: Secondary | ICD-10-CM | POA: Diagnosis not present

## 2021-12-24 DIAGNOSIS — N189 Chronic kidney disease, unspecified: Secondary | ICD-10-CM | POA: Diagnosis not present

## 2021-12-24 DIAGNOSIS — G20 Parkinson's disease: Secondary | ICD-10-CM | POA: Diagnosis not present

## 2021-12-24 DIAGNOSIS — K219 Gastro-esophageal reflux disease without esophagitis: Secondary | ICD-10-CM | POA: Diagnosis not present

## 2021-12-24 DIAGNOSIS — Z79899 Other long term (current) drug therapy: Secondary | ICD-10-CM | POA: Diagnosis not present

## 2021-12-24 DIAGNOSIS — K5900 Constipation, unspecified: Secondary | ICD-10-CM | POA: Diagnosis not present

## 2021-12-24 DIAGNOSIS — I10 Essential (primary) hypertension: Secondary | ICD-10-CM | POA: Diagnosis not present

## 2021-12-24 DIAGNOSIS — D508 Other iron deficiency anemias: Secondary | ICD-10-CM | POA: Diagnosis not present

## 2021-12-25 ENCOUNTER — Other Ambulatory Visit: Payer: Self-pay

## 2021-12-25 DIAGNOSIS — M6281 Muscle weakness (generalized): Secondary | ICD-10-CM | POA: Diagnosis not present

## 2021-12-25 DIAGNOSIS — N1831 Chronic kidney disease, stage 3a: Secondary | ICD-10-CM

## 2021-12-25 DIAGNOSIS — M5451 Vertebrogenic low back pain: Secondary | ICD-10-CM | POA: Diagnosis not present

## 2021-12-25 DIAGNOSIS — I509 Heart failure, unspecified: Secondary | ICD-10-CM | POA: Diagnosis not present

## 2021-12-25 DIAGNOSIS — R768 Other specified abnormal immunological findings in serum: Secondary | ICD-10-CM

## 2021-12-25 DIAGNOSIS — J449 Chronic obstructive pulmonary disease, unspecified: Secondary | ICD-10-CM | POA: Diagnosis not present

## 2021-12-25 DIAGNOSIS — R262 Difficulty in walking, not elsewhere classified: Secondary | ICD-10-CM | POA: Diagnosis not present

## 2021-12-25 DIAGNOSIS — R2681 Unsteadiness on feet: Secondary | ICD-10-CM | POA: Diagnosis not present

## 2021-12-26 LAB — IFE+PROTEIN ELECTRO, 24-HR UR
% BETA, Urine: 0 %
ALPHA 1 URINE: 0 %
Albumin, U: 100 %
Alpha 2, Urine: 0 %
GAMMA GLOBULIN URINE: 0 %
Total Protein, Urine-Ur/day: 54 mg/24 hr (ref 30–150)
Total Protein, Urine: 10.7 mg/dL
Total Volume: 500

## 2021-12-28 DIAGNOSIS — M6281 Muscle weakness (generalized): Secondary | ICD-10-CM | POA: Diagnosis not present

## 2021-12-28 DIAGNOSIS — R2681 Unsteadiness on feet: Secondary | ICD-10-CM | POA: Diagnosis not present

## 2021-12-28 DIAGNOSIS — R262 Difficulty in walking, not elsewhere classified: Secondary | ICD-10-CM | POA: Diagnosis not present

## 2021-12-28 DIAGNOSIS — M5451 Vertebrogenic low back pain: Secondary | ICD-10-CM | POA: Diagnosis not present

## 2021-12-30 ENCOUNTER — Encounter: Payer: Self-pay | Admitting: Emergency Medicine

## 2021-12-30 ENCOUNTER — Ambulatory Visit
Admission: EM | Admit: 2021-12-30 | Discharge: 2021-12-30 | Disposition: A | Discharge status: Home / Self Care | Payer: Medicare HMO | Attending: Internal Medicine | Admitting: Internal Medicine

## 2021-12-30 ENCOUNTER — Other Ambulatory Visit: Payer: Self-pay

## 2021-12-30 DIAGNOSIS — M5441 Lumbago with sciatica, right side: Secondary | ICD-10-CM | POA: Diagnosis not present

## 2021-12-30 HISTORY — DX: Parkinson's disease without dyskinesia, without mention of fluctuations: G20.A1

## 2021-12-30 HISTORY — DX: Parkinson's disease: G20

## 2021-12-30 MED ORDER — METHOCARBAMOL 500 MG PO TABS: 500.0000 mg | ORAL_TABLET | Freq: Two times a day (BID) | ORAL | 0 refills | Status: DC | PRN

## 2021-12-30 NOTE — ED Provider Notes (Signed)
[name]    CSN: [id number] Arrival date & time: 12/30/21  1153      History   Chief Complaint Chief Complaint  Patient presents with   Back Pain    HPI Morgan [name] is a 71 y.o. female.   Patient presents for further evaluation of right lower back pain that started yesterday after she pulled a muscle.  Patient reports that she was reaching out her car window to her mailbox when she immediately had pain in her right lower back.  Patient does have history of thoracic pain but has never had chronic pain in the lower back.  She has had several thoracic back surgeries and has history of fracture in the thoracic area.  Denies urinary burning, urinary frequency, urinary or bowel incontinence, saddle anesthesia.  Pain is rated 8/10 on pain scale.  She has taken over-the-counter pain relievers with no improvement.  Pain does radiate down right leg.  Movement worsens pain.   Back Pain  Past Medical History:  Diagnosis Date   Allergy    Anemia    Anxiety    Arthritis    CHF (congestive heart failure) (HCC)    Depression    GERD (gastroesophageal reflux disease)    Hypertension    MVP (mitral valve prolapse)    Parkinson disease ([place])     Patient Active Problem List   Diagnosis Date Noted   Hypotension 10/27/2021   Tremor 10/27/2021   Microcytic anemia 09/11/2021   Stage 3a chronic kidney disease ([place]) 09/11/2021   IDA (iron deficiency anemia) 09/11/2021   Elevated TSH 08/28/2021   Osteopenia 08/28/2021   DOE (dyspnea on exertion) 08/28/2021   Acute midline thoracic back pain 06/26/2021   Other chronic pain 06/26/2021   Chest pressure 02/20/2021   Left sided abdominal pain 02/01/2021   Chronic diastolic CHF (congestive heart failure) ([place]) 08/09/2020   COPD (chronic obstructive pulmonary disease) ([place]) 08/09/2020   Chronic respiratory failure with hypoxia ([place]) 08/09/2020   Polyp of sigmoid colon    Polyp of cecum    Gastritis and gastroduodenitis     Pylorus ulcer    Pyloric stenosis in adult    Chronic cholecystitis 05/19/2020   Oxygen dependent 03/30/2020   Nausea and vomiting in adult 03/22/2020   Peptic ulcer disease 03/22/2020   Iron deficiency anemia 03/22/2020   Acute pain of right knee 12/29/2019   Acute left-sided low back pain without sciatica 12/29/2019   Chronic abdominal pain 02/19/2019   Asymptomatic bacteriuria 02/19/2019   CKD (chronic kidney disease) stage 3, GFR 30-59 ml/min ([place]) 01/13/2019   CHF (congestive heart failure) ([place]) 01/13/2019   Dizziness 01/13/2019   Hoarseness 11/12/2018   Dysphagia 11/12/2018   Sleepiness 11/12/2018   CVA (cerebral vascular accident) ([place]) 08/22/2018   Left-sided weakness 10/04/2017   HLD (hyperlipidemia) 10/04/2017   GERD (gastroesophageal reflux disease) 10/04/2017   Anxiety 08/26/2017   Essential hypertension    Depression    Diarrhea 02/22/2015   Hypokalemia 02/22/2015   Chronic lower back pain 04/29/2013   Paresthesia 04/29/2013    Past Surgical History:  Procedure Laterality Date   ABDOMINAL HYSTERECTOMY     APPENDECTOMY     BALLOON DILATION N/A 04/21/2020   Procedure: BALLOON DILATION;  Surgeon: Milus Banister, MD;  Location: [facility];  Service: Endoscopy;  Laterality: N/A;  pyloric/ GI   BALLOON DILATION N/A 07/04/2020   Procedure: BALLOON DILATION;  Surgeon: Mauri Pole, MD;  Location: [facility];  Service:  Endoscopy;  Laterality: N/A;   BIOPSY  04/21/2020   Procedure: BIOPSY;  Surgeon: Milus Banister, MD;  [facility]: [facility];  Service: Endoscopy;;   BIOPSY  07/04/2020   Procedure: BIOPSY;  Surgeon: Mauri Pole, MD;  [facility]: [facility];  Service: Endoscopy;;  EGD and COLON   BREAST SURGERY Right 1988   tumor removal   CHOLECYSTECTOMY N/A 05/18/2020   Procedure: LAPAROSCOPIC CHOLECYSTECTOMY;  Surgeon: Johnathan Hausen, MD;  Location: [facility];  Service: General;  Laterality: N/A;   COLONOSCOPY WITH PROPOFOL N/A 07/04/2020   Procedure:  COLONOSCOPY WITH PROPOFOL;  Surgeon: Mauri Pole, MD;  [facility]: [facility];  Service: Endoscopy;  Laterality: N/A;   COLONOSCOPY WITH PROPOFOL N/A 11/03/2021   Procedure: COLONOSCOPY WITH PROPOFOL;  Surgeon: Lesly Rubenstein, MD;  Location: [facility];  Service: Endoscopy;  Laterality: N/A;   ESOPHAGEAL DILATION  08/11/2020   Procedure: PYLORIC DILATION;  Surgeon: Doran Stabler, MD;  [facility];  Service: Gastroenterology;;   ESOPHAGOGASTRODUODENOSCOPY (EGD) WITH PROPOFOL N/A 04/21/2020   Procedure: ESOPHAGOGASTRODUODENOSCOPY (EGD) WITH PROPOFOL;  Surgeon: Milus Banister, MD;  [facility];  Service: Endoscopy;  Laterality: N/A;   ESOPHAGOGASTRODUODENOSCOPY (EGD) WITH PROPOFOL N/A 07/04/2020   Procedure: ESOPHAGOGASTRODUODENOSCOPY (EGD) WITH PROPOFOL;  Surgeon: Mauri Pole, MD;  [facility];  Service: Endoscopy;  Laterality: N/A;   ESOPHAGOGASTRODUODENOSCOPY (EGD) WITH PROPOFOL N/A 08/11/2020   Procedure: ESOPHAGOGASTRODUODENOSCOPY (EGD) WITH PROPOFOL;  Surgeon: Doran Stabler, MD;  [facility];  Service: Gastroenterology;  Laterality: N/A;   ESOPHAGOGASTRODUODENOSCOPY (EGD) WITH PROPOFOL N/A 11/03/2021   Procedure: ESOPHAGOGASTRODUODENOSCOPY (EGD) WITH PROPOFOL;  Surgeon: Lesly Rubenstein, MD;  Location: [facility];  Service: Endoscopy;  Laterality: N/A;   KNEE SURGERY  2005   2 times left   KNEE SURGERY Right    KYPHOPLASTY N/A 07/20/2021   Procedure: T 10KYPHOPLASTY;  Surgeon: Hessie Knows, MD;  Location: [facility];  Service: Orthopedics;  Laterality: N/A;   KYPHOPLASTY N/A 08/23/2021   Procedure: T9 KYPHOPLASTY;  Surgeon: Hessie Knows, MD;  Location: [facility];  Service: Orthopedics;  Laterality: N/A;   POLYPECTOMY  07/04/2020   Procedure: POLYPECTOMY;  Surgeon: Mauri Pole, MD;  [facility];  Service: Endoscopy;;   TUBAL LIGATION      OB History   No obstetric history on file.       Home Medications    Prior to Admission medications   Medication Sig Start Date End Date Taking? Authorizing Provider  aspirin-acetaminophen-caffeine (EXCEDRIN MIGRAINE) [phone] MG tablet Take 1-2 tablets by mouth every 6 (six) hours as needed for headache.   Yes [provider]  atorvastatin (LIPITOR) 40 MG tablet Take 1 tablet (40 mg total) by mouth daily. 09/27/21  Yes [name], MD  carbidopa-levodopa (SINEMET IR) 25-100 MG tablet Take 1 tablet by mouth 3 (three) times daily. 11/21/21  Yes [provider]  carvedilol (COREG) 12.5 MG tablet Take 12.5 mg by mouth 2 (two) times daily. 10/25/21  Yes [provider]  cetirizine (ZYRTEC ALLERGY) 10 MG tablet Take 1 tablet (10 mg total) by mouth daily. 09/12/21  Yes Jaynee Eagles, PA-C  FLUoxetine (PROZAC) 40 MG capsule Take 40 mg by mouth daily.   Yes [provider]  furosemide (LASIX) 40 MG tablet Take 1 tablet (40 mg total) by mouth daily. 10/07/21  Yes Sreenath, Sudheer B, MD  gabapentin (NEURONTIN) 400 MG capsule Take 400 mg by mouth 3 (three) times daily as needed (for  nerve pain). 10/20/18  Yes [provider]  hydrOXYzine (ATARAX/VISTARIL) 25 MG tablet Take 25 mg by mouth every 8 (eight) hours as needed for anxiety. 08/22/21  Yes [provider]  ibuprofen (ADVIL) 600 MG tablet Take 1 tablet (600 mg total) by mouth every 6 (six) hours as needed. 06/12/21  Yes Melynda Ripple, MD  KLOR-CON M20 20 MEQ tablet TAKE 1 TABLET BY MOUTH EVERY DAY 12/22/21  Yes [name], MD  methocarbamol (ROBAXIN) 500 MG tablet Take 1 tablet (500 mg total) by mouth 2 (two) times daily as needed for muscle spasms. 12/30/21  Yes , Hildred Alamin E, FNP  mirtazapine (REMERON) 15 MG tablet Take 15 mg by mouth at bedtime. 10/20/20  Yes [provider]  omeprazole (PRILOSEC) 40 MG capsule TAKE 1 CAPSULE BY MOUTH EVERY DAY Patient taking differently: Take 80 mg by mouth daily. 08/14/21  Yes [name], MD  ondansetron (ZOFRAN-ODT) 4 MG disintegrating tablet Take 1-2 tablets by mouth every 6 hours as needed 03/03/21  Yes Lemmon, Lavone Nian, PA  albuterol (VENTOLIN HFA) 108 (90 Base) MCG/ACT inhaler Inhale 1-2 puffs into the lungs every 6 (six) hours as needed for wheezing or shortness of breath. 10/31/20   Tasia Catchings, Amy V, PA-C  alendronate (FOSAMAX) 70 MG tablet Take 1 tablet (70 mg total) by mouth every 7 (seven) days. Take with a full glass of water on an empty stomach. 08/28/21   [name], MD  fluticasone-salmeterol (ADVAIR HFA) [phone] MCG/ACT inhaler Inhale 2 puffs into the lungs 2 (two) times daily. 10/17/21   [name], MD  linaclotide [facility]) 145 MCG CAPS capsule Take 145 mcg by mouth daily before breakfast. Patient not taking: Reported on 11/03/2021    [provider]  meloxicam (MOBIC) 7.5 MG tablet Take 7.5 mg by mouth daily. 09/18/21   [provider]  nitrofurantoin, macrocrystal-monohydrate, (MACROBID) 100 MG capsule Take 1 capsule (100 mg total) by mouth 2 (two) times daily. 10/29/21   Francene Finders, PA-C  OXYGEN Inhale 2 L/min into the lungs See admin instructions. 2 L/min at bedtime and as needed for shortness of breath throughout the day    [provider]  polyethylene glycol-electrolytes (NULYTELY) 420 g solution Take 4,000 mLs by mouth as directed. 09/21/21   [provider]  predniSONE (DELTASONE) 20 MG tablet Take 2 tablets daily with breakfast. 09/20/21   Jaynee Eagles, PA-C  valsartan (DIOVAN) 320 MG tablet Take 320 mg by mouth daily. 05/11/21   [provider]    Family History Family History  Problem Relation Age of Onset   Breast cancer Mother 40   Ovarian cancer Mother 10   Brain cancer Mother    Hypertension Father    Hyperlipidemia Father    Heart disease Father    Colon cancer Father        diagnosed in his 58s   Breast cancer Maternal Grandmother 94   Diabetes Paternal Grandmother    Breast cancer  Paternal Grandmother 4   Other Son        drug over dose   Colon cancer Maternal Grandfather        dx in his 49s   Liver disease Maternal Grandfather    Breast cancer Maternal Aunt 60   Breast cancer Maternal Aunt 60   Colon cancer Maternal Uncle    Stomach cancer Neg Hx    Pancreatic cancer Neg Hx    Esophageal cancer Neg Hx  Social History Social History   Tobacco Use   Smoking status: Former    Packs/day: 1.00    Years: 15.00    Pack years: 15.00    Types: Cigarettes    Quit date: 09/12/2017    Years since quitting: 4.3   Smokeless tobacco: Never  Vaping Use   Vaping Use: Never used  Substance Use Topics   Alcohol use: Not Currently   Drug use: No     Allergies   Meperidine, Strawberry extract, Carafate [sucralfate], and Wasp venom   Review of Systems Review of Systems Per HPI  Physical Exam Triage Vital Signs ED Triage Vitals  Enc Vitals Group     BP 12/30/21 1240 110/70     Pulse Rate 12/30/21 1240 78     Resp 12/30/21 1240 16     Temp 12/30/21 1240 97.8 F (36.6 C)     Temp Source 12/30/21 1240 Oral     SpO2 12/30/21 1240 94 %     Weight --      Height --      Head Circumference --      Peak Flow --      Pain Score 12/30/21 1241 8     Pain Loc --      Pain Edu? --      Excl. in [place]? --    No data found.  Updated Vital Signs BP 110/70 (BP Location: Left Arm)    Pulse 78    Temp 97.8 F (36.6 C) (Oral)    Resp 16    SpO2 94%   Visual Acuity Right Eye Distance:   Left Eye Distance:   Bilateral Distance:    Right Eye Near:   Left Eye Near:    Bilateral Near:     Physical Exam Constitutional:      General: She is not in acute distress.    Appearance: Normal appearance. She is not toxic-appearing or diaphoretic.  HENT:     Head: Normocephalic and atraumatic.  Eyes:     Extraocular Movements: Extraocular movements intact.     Conjunctiva/sclera: Conjunctivae normal.  Pulmonary:     Effort: Pulmonary effort is normal.   Musculoskeletal:     Cervical back: Normal.     Thoracic back: Normal.     Lumbar back: Tenderness present. No swelling, edema or bony tenderness. Positive right straight leg raise test.       Back:     Comments: Tenderness to palpation to right lower lumbar region.  No direct spinal tenderness, crepitus, step-off.  Neurological:     General: No focal deficit present.     Mental Status: She is alert and oriented to person, place, and time. Mental status is at baseline.     Deep Tendon Reflexes: Reflexes are normal and symmetric.  Psychiatric:        Mood and Affect: Mood normal.        Behavior: Behavior normal.        Thought Content: Thought content normal.        Judgment: Judgment normal.     UC Treatments / Results  Labs (all labs ordered are listed, but only abnormal results are displayed) Labs Reviewed - No data to display  EKG   Radiology No results found.  Procedures Procedures (including critical care time)  Medications Ordered in UC Medications - No data to display  Initial Impression / Assessment and Plan / UC Course  I have reviewed the triage vital signs and  the nursing notes.  Pertinent labs & imaging results that were available during my care of the patient were reviewed by me and considered in my medical decision making (see chart for details).     Patient's physical exam is consistent with right lower lumbar strain with possible sciatica.  Patient reports that she has received relief from muscle relaxers in the past and has tolerated well.  Will prescribe low-dose methocarbamol due to patient's age.  Advised patient this can cause drowsiness and to not take any other sedating medications with this muscle relaxer.  Supportive care discussed with patient.  Discussed alternating ice and heat application as well.  Do not think that imaging is necessary given that there was no impact injury and no direct spinal tenderness.  Discussed return precautions.   Patient verbalized understanding and was agreeable with plan. Final Clinical Impressions(s) / UC Diagnoses   Final diagnoses:  Acute right-sided low back pain with right-sided sciatica     Discharge Instructions      You have been prescribed a muscle relaxer.  Please be advised that this can cause drowsiness.  Do not take any other sedating medications while taking this medication.  Also alternate ice and heat application to affected area.    ED Prescriptions     Medication Sig Dispense Auth. Provider   methocarbamol (ROBAXIN) 500 MG tablet Take 1 tablet (500 mg total) by mouth 2 (two) times daily as needed for muscle spasms. 20 tablet [place], Michele Rockers, [place]      PDMP not reviewed this encounter.   Teodora Medici, [place] 12/30/21 1340

## 2021-12-30 NOTE — Discharge Instructions (Signed)
You have been prescribed a muscle relaxer.  Please be advised that this can cause drowsiness.  Do not take any other sedating medications while taking this medication.  Also alternate ice and heat application to affected area.

## 2021-12-30 NOTE — ED Triage Notes (Addendum)
States she threw out her back reaching out the car window to get the mail last night. Pain 8/10. Hx of same, and broken back twice. States muscle relaxer's usually help her when she feels this way. Right lower back pain, denies changes to urine. Walking worsens pain, rest eases pain. Reports numbness/tingling down right leg

## 2022-01-02 ENCOUNTER — Telehealth: Payer: Self-pay

## 2022-01-02 NOTE — Progress Notes (Signed)
Chronic Care Management Pharmacy Assistant   Name: Morgan [name]  MRN: [id number] DOB: 1951-02-16  Reason for Encounter: CCM (General Adherence)   Recent office visits:  None since last CCM contact  Recent consult visits:  12/30/2021 - [facility] - Patient presented for back pain. Start: methocarbamol (ROBAXIN) 500 MG tablet.  12/21/2021 - [name], MD - Oncology - Patient presented for evaluation of anemia. Orders: IFE + Protein Electro, CBC, CMP, Ferritin and Iron and TIBC. No medication changes. Infusion: iron sucrose (VENOFER) injection 200 mg and 0.9 % sodium chloride infusion. Referral to [facility] kidney.  12/16/2021 - Jennings Books, MD - Neurology- Patient presented for seizures. No other information.   Hospital visits:  None since last CCM contact  Medications: Outpatient Encounter Medications as of 01/02/2022  Medication Sig   albuterol (VENTOLIN HFA) 108 (90 Base) MCG/ACT inhaler Inhale 1-2 puffs into the lungs every 6 (six) hours as needed for wheezing or shortness of breath.   alendronate (FOSAMAX) 70 MG tablet Take 1 tablet (70 mg total) by mouth every 7 (seven) days. Take with a full glass of water on an empty stomach.   aspirin-acetaminophen-caffeine (EXCEDRIN MIGRAINE) 250-250-65 MG tablet Take 1-2 tablets by mouth every 6 (six) hours as needed for headache.   atorvastatin (LIPITOR) 40 MG tablet Take 1 tablet (40 mg total) by mouth daily.   carbidopa-levodopa (SINEMET IR) 25-100 MG tablet Take 1 tablet by mouth 3 (three) times daily.   carvedilol (COREG) 12.5 MG tablet Take 12.5 mg by mouth 2 (two) times daily.   cetirizine (ZYRTEC ALLERGY) 10 MG tablet Take 1 tablet (10 mg total) by mouth daily.   FLUoxetine (PROZAC) 40 MG capsule Take 40 mg by mouth daily.   fluticasone-salmeterol (ADVAIR HFA) 115-21 MCG/ACT inhaler Inhale 2 puffs into the lungs 2 (two) times daily.   furosemide (LASIX) 40 MG tablet Take 1 tablet (40 mg total) by mouth daily.    gabapentin (NEURONTIN) 400 MG capsule Take 400 mg by mouth 3 (three) times daily as needed (for nerve pain).   hydrOXYzine (ATARAX/VISTARIL) 25 MG tablet Take 25 mg by mouth every 8 (eight) hours as needed for anxiety.   ibuprofen (ADVIL) 600 MG tablet Take 1 tablet (600 mg total) by mouth every 6 (six) hours as needed.   KLOR-CON M20 20 MEQ tablet TAKE 1 TABLET BY MOUTH EVERY DAY   linaclotide (LINZESS) 145 MCG CAPS capsule Take 145 mcg by mouth daily before breakfast. (Patient not taking: Reported on 11/03/2021)   meloxicam (MOBIC) 7.5 MG tablet Take 7.5 mg by mouth daily.   methocarbamol (ROBAXIN) 500 MG tablet Take 1 tablet (500 mg total) by mouth 2 (two) times daily as needed for muscle spasms.   mirtazapine (REMERON) 15 MG tablet Take 15 mg by mouth at bedtime.   nitrofurantoin, macrocrystal-monohydrate, (MACROBID) 100 MG capsule Take 1 capsule (100 mg total) by mouth 2 (two) times daily.   omeprazole (PRILOSEC) 40 MG capsule TAKE 1 CAPSULE BY MOUTH EVERY DAY (Patient taking differently: Take 80 mg by mouth daily.)   ondansetron (ZOFRAN-ODT) 4 MG disintegrating tablet Take 1-2 tablets by mouth every 6 hours as needed   OXYGEN Inhale 2 L/min into the lungs See admin instructions. 2 L/min at bedtime and as needed for shortness of breath throughout the day   polyethylene glycol-electrolytes (NULYTELY) 420 g solution Take 4,000 mLs by mouth as directed.   predniSONE (DELTASONE) 20 MG tablet Take 2 tablets daily with breakfast.  valsartan (DIOVAN) 320 MG tablet Take 320 mg by mouth daily.   No facility-administered encounter medications on file as of 01/02/2022.   Contacted [facility] on 01/02/2022 for general disease state and medication adherence call.   Patient is not more than 5 days past due for refill on the following medications per chart history:  Star Medications: Medication Name/mg Last Fill Days Supply Atorvastatin 40 mg  12/24/2021 90   Valsartan 320  mg  11/12/2021 90   What concerns do you have about your medications? Patient does not have any concerns.   The patient denies side effects with their medications.   How often do you forget or accidentally miss a dose? Never  Do you use a pillbox? No  Are you having any problems getting your medications from your pharmacy? No  Has the cost of your medications been a concern? No  Since last visit with CPP, no interventions have been made.   The patient has not had an ED visit since last contact.   The patient denies problems with their health.   Patient denies concerns or questions for Debbora Dus, PharmD at this time.   Care Gaps: Annual wellness visit in last year? No Most Recent BP reading: 110/70 on 12/30/2021  Upcoming appointments: CCM appointment on 01/17/2022 @ 3:30 with Pecola Leisure, CPP notified  Marijean Niemann, [place] Pharmacy Assistant [phone]  Time Spent: 30 Minutes

## 2022-01-09 DIAGNOSIS — D649 Anemia, unspecified: Secondary | ICD-10-CM | POA: Insufficient documentation

## 2022-01-09 DIAGNOSIS — N1832 Chronic kidney disease, stage 3b: Secondary | ICD-10-CM | POA: Insufficient documentation

## 2022-01-10 ENCOUNTER — Other Ambulatory Visit: Payer: Self-pay | Admitting: Nephrology

## 2022-01-10 DIAGNOSIS — R609 Edema, unspecified: Secondary | ICD-10-CM | POA: Insufficient documentation

## 2022-01-10 DIAGNOSIS — I509 Heart failure, unspecified: Secondary | ICD-10-CM | POA: Diagnosis not present

## 2022-01-10 DIAGNOSIS — M549 Dorsalgia, unspecified: Secondary | ICD-10-CM | POA: Diagnosis not present

## 2022-01-10 DIAGNOSIS — G8929 Other chronic pain: Secondary | ICD-10-CM | POA: Insufficient documentation

## 2022-01-10 DIAGNOSIS — G20 Parkinson's disease: Secondary | ICD-10-CM | POA: Diagnosis not present

## 2022-01-10 DIAGNOSIS — N1832 Chronic kidney disease, stage 3b: Secondary | ICD-10-CM | POA: Diagnosis not present

## 2022-01-10 DIAGNOSIS — F419 Anxiety disorder, unspecified: Secondary | ICD-10-CM | POA: Diagnosis not present

## 2022-01-10 DIAGNOSIS — N189 Chronic kidney disease, unspecified: Secondary | ICD-10-CM

## 2022-01-10 DIAGNOSIS — F5101 Primary insomnia: Secondary | ICD-10-CM | POA: Diagnosis not present

## 2022-01-10 DIAGNOSIS — R69 Illness, unspecified: Secondary | ICD-10-CM | POA: Diagnosis not present

## 2022-01-10 DIAGNOSIS — F0154 Vascular dementia, unspecified severity, with anxiety: Secondary | ICD-10-CM | POA: Insufficient documentation

## 2022-01-10 DIAGNOSIS — D631 Anemia in chronic kidney disease: Secondary | ICD-10-CM | POA: Diagnosis not present

## 2022-01-10 DIAGNOSIS — E785 Hyperlipidemia, unspecified: Secondary | ICD-10-CM | POA: Diagnosis not present

## 2022-01-10 DIAGNOSIS — I10 Essential (primary) hypertension: Secondary | ICD-10-CM | POA: Diagnosis not present

## 2022-01-10 DIAGNOSIS — K219 Gastro-esophageal reflux disease without esophagitis: Secondary | ICD-10-CM | POA: Diagnosis not present

## 2022-01-17 ENCOUNTER — Telehealth: Payer: Self-pay

## 2022-01-17 ENCOUNTER — Telehealth: Payer: Self-pay | Admitting: Pharmacist

## 2022-01-17 ENCOUNTER — Telehealth: Payer: Medicare HMO

## 2022-01-17 NOTE — Progress Notes (Signed)
Chronic Care Management Pharmacy Assistant   Name: Morgan [name]  MRN: [id number] DOB: 03-24-1951  Reason for Encounter: CCM (Appointment Reminder)  Medications: Outpatient Encounter Medications as of 01/17/2022  Medication Sig   albuterol (VENTOLIN HFA) 108 (90 Base) MCG/ACT inhaler Inhale 1-2 puffs into the lungs every 6 (six) hours as needed for wheezing or shortness of breath.   alendronate (FOSAMAX) 70 MG tablet Take 1 tablet (70 mg total) by mouth every 7 (seven) days. Take with a full glass of water on an empty stomach.   aspirin-acetaminophen-caffeine (EXCEDRIN MIGRAINE) 250-250-65 MG tablet Take 1-2 tablets by mouth every 6 (six) hours as needed for headache.   atorvastatin (LIPITOR) 40 MG tablet Take 1 tablet (40 mg total) by mouth daily.   carbidopa-levodopa (SINEMET IR) 25-100 MG tablet Take 1 tablet by mouth 3 (three) times daily.   carvedilol (COREG) 12.5 MG tablet Take 12.5 mg by mouth 2 (two) times daily.   cetirizine (ZYRTEC ALLERGY) 10 MG tablet Take 1 tablet (10 mg total) by mouth daily.   FLUoxetine (PROZAC) 40 MG capsule Take 40 mg by mouth daily.   fluticasone-salmeterol (ADVAIR HFA) 115-21 MCG/ACT inhaler Inhale 2 puffs into the lungs 2 (two) times daily.   furosemide (LASIX) 40 MG tablet Take 1 tablet (40 mg total) by mouth daily.   gabapentin (NEURONTIN) 400 MG capsule Take 400 mg by mouth 3 (three) times daily as needed (for nerve pain).   hydrOXYzine (ATARAX/VISTARIL) 25 MG tablet Take 25 mg by mouth every 8 (eight) hours as needed for anxiety.   ibuprofen (ADVIL) 600 MG tablet Take 1 tablet (600 mg total) by mouth every 6 (six) hours as needed.   KLOR-CON M20 20 MEQ tablet TAKE 1 TABLET BY MOUTH EVERY DAY   linaclotide (LINZESS) 145 MCG CAPS capsule Take 145 mcg by mouth daily before breakfast. (Patient not taking: Reported on 11/03/2021)   meloxicam (MOBIC) 7.5 MG tablet Take 7.5 mg by mouth daily.   methocarbamol (ROBAXIN) 500 MG tablet Take 1 tablet (500 mg  total) by mouth 2 (two) times daily as needed for muscle spasms.   mirtazapine (REMERON) 15 MG tablet Take 15 mg by mouth at bedtime.   nitrofurantoin, macrocrystal-monohydrate, (MACROBID) 100 MG capsule Take 1 capsule (100 mg total) by mouth 2 (two) times daily.   omeprazole (PRILOSEC) 40 MG capsule TAKE 1 CAPSULE BY MOUTH EVERY DAY (Patient taking differently: Take 80 mg by mouth daily.)   ondansetron (ZOFRAN-ODT) 4 MG disintegrating tablet Take 1-2 tablets by mouth every 6 hours as needed   OXYGEN Inhale 2 L/min into the lungs See admin instructions. 2 L/min at bedtime and as needed for shortness of breath throughout the day   polyethylene glycol-electrolytes (NULYTELY) 420 g solution Take 4,000 mLs by mouth as directed.   predniSONE (DELTASONE) 20 MG tablet Take 2 tablets daily with breakfast.   valsartan (DIOVAN) 320 MG tablet Take 320 mg by mouth daily.   No facility-administered encounter medications on file as of 01/17/2022.   Morgan [name] was contacted to remind of upcoming telephone visit with Charlene Brooke on 01/17/2022 at 3:30 pm. Patient was reminded to have all medications, supplements and any blood glucose and blood pressure readings available for review at appointment. If unable to reach, a voicemail was left for patient.   Star Rating Drugs: Medication:  Last Fill: Day Supply Atorvastatin 40 mg 12/24/2021      90  Valsartan 320 mg 11/12/2021      [place], CPP notified  Marijean Niemann, [place] Clinical Pharmacy Assistant [phone]  Time Spent: 10 Minutes

## 2022-01-17 NOTE — Telephone Encounter (Signed)
°  Chronic Care Management   Outreach Note  01/17/2022 Name: Morgan [name] MRN: [id number] DOB: 02/24/1951  Referred by: [name], MD  Patient had a phone appointment scheduled with clinical pharmacist today.  An unsuccessful telephone outreach was attempted today. The patient was referred to the pharmacist for assistance with medications, care management and care coordination.   Patient will NOT be penalized in any way for missing a CCM appointment. The no-show fee does not apply.  If possible, a message was left to return call to: [phone] or to [facility].  Charlene Brooke, PharmD, BCACP Clinical Pharmacist [facility] at [facility] [phone]

## 2022-01-17 NOTE — Progress Notes (Deleted)
° °Chronic Care Management °Pharmacy Note ° °01/17/2022 °Name:  Morgan [name] MRN:  [id number] DOB:  04/04/1951 ° °Summary: °-Pt reports compliance with furosemide 40 mg and potassium 20 mEq once daily (before breakfast); she reports she is drinking fluids regularly; she denies s/sx of dehydration °-Pt reports she has restarted atorvastatin, carvedilol and aspirin as advised last CCM visit °-Pt reports she gets "jittery" when she takes albuterol and asked if Advair would cause this as well; Advair contains a LABA so it can potentially cause a similar side effect ° °Recommendations/Changes made from today's visit: °-Recommend repeat lipid panel at next PCP or cardiology visit °-Advised pt to take Advair twice daily and to contact PCP/PharmD if side effects occur; consider LAMA monotherapy (Spiriva, Incruse) if LABA is not tolerated ° °Plan: °-[facility] Concierge will call patient *** °-Pharmacist follow up televisit scheduled for *** °-PCP f/u due May 2023 (6-month); not yet scheduled ° ° °Subjective: °Morgan [name] is an 71 y.o. year old female who is a primary patient of Cody, Jessica R, MD.  The CCM team was consulted for assistance with disease management and care coordination needs.   ° °Engaged with patient by telephone for follow up visit in response to provider referral for pharmacy case management and/or care coordination services.  ° °Consent to Services:  °The patient was given information about Chronic Care Management services, agreed to services, and gave verbal consent prior to initiation of services.  Please see initial visit note for detailed documentation.  ° °Patient Care Team: °Cody, Jessica R, MD as PCP - General (Family Medicine) °,  N, RPH as Pharmacist (Pharmacist) °Yu, Zhou, MD as Consulting Physician (Hematology and Oncology) ° °Recent office visits: °10/27/2021 - Richard Letvak, MD - Patient presented for low blood pressure. Blood pressure reading in office was 118/78. Tremor  highly suggestive of primary PD. Referral place to Neurology. Stop isosorbide. Stay on lower dose of Carvedilol (6.25 mg BID). D/C aspirin. °10/18/2021 - Jessica Cody, MD - Orders only - Start: metronidazole 500 mg tablet - Take 1 tablet by mouth 2 times daily for 7 days due to bacterial vaginosis. °10/17/21 Dr Cody OV: hospital f/u; start Advair HFA 115-21 BID ° °08/28/21 Cody, Jessica R, MD- PCP: chronic f/u; CHF exacerbation - increase lasix to 40 mg BID x 5 days, start Kcl 20 meq w/ lasix. C/o pyloric stenosis, advised to contact GI, hold starting alendronate until sx resolve. Low HGB - referred to hematology. °  °06/26/21 Cody, Jessica R, MD- PCP: acute visit s/p fall, back pain. Fell ~6/27. Rx'd oxycodone for pain. Referral to ortho for severe pain response. ° °Recent consult visits: °01/10/22 Dr Korrapati (nephrology): new consult CKD. Advised to stop NSAIDs (on ibuoprofen for back pain for years). Fluid restriction 1 L per day. ° °12/30/21 Urgent Care: Back pain - pulled a muscle. Pain 8/10. Rx'd methocarbamol. ° °12/21/21 Dr Yu (oncology): f/u IDA. Rec another dose of Venofer, goal ferritin > 200. No need for ESA (Hgb > 10). Referred to nephrology. ° °11/27/2021 - Hemang Shah, MD - Neurology - Telephone -Pt was having N/V/D with Sinemet 1 tab TID. Side effects resolved with lower dose 1/2 tab TID. ° °11/23/2021 - Bruce Kowalski, MD - Cardiology - Patient presented for four week follow up for congestive heart failure, hypertension and fatigue. No medication changes.  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 Ordered: MRI brain without contrast. Ordered: Sleep deprived EEG. Order: Physical   Therapy. Start: Carbidopa/levodopa 25/100 mg - 1 tab three times a day. Recommended patient not drive due to "white-out" spells.  ° °10/29/2021 - [facility] - UTI. Rx'd Macrobid.  ° °10/26/2021 - Bruce Kowalski, MD - Cardiology - Telephone - Patient called she  is fatigued and shaky since starting Carvedilol. Change: Take half a dose BID of Carvedilol due to side effects. ° °10/25/2021 - Bruce Kowalski, MD - Cardiology - Patient presented for 6 month follow up for dizziness and chest pain. Started isosorbide 30 mg daily and increased carvedilol to 12.5 mg BID. ° °09/20/21 Urgent Care: sinusitis - rx'd Augmentin, prednisone 20 mg. Low suspicion COPD exacerbation. °09/12/21 Urgent care: c/o sore throat. Dx viral URI. Covid, strep negative.  Meds: cetirizine 10 mg daily, and prednisone 30 mg daily °09/11/21 Yu, Zhou, MD (Heme/onc): new pt - eval IDA. RTC 2 weeks, +/- Venofer. (Avoid oral iron d/t chronic constipation) °09/06/21  Locklear, Cameron Trevor, MD (GI - [facility]): f/u chronic nausea, dysphagia. Ordered barium swallow, NM gastric emptying study °07/31/21 Menz, Michael Joseph, MD (ortho - [facility]): f/u T10 compression fx °07/19/21 Menz, Michael Joseph, MD  (ortho - [facility]): f/u T10 compression fx. Schedule kyphoplasty tomorrow and vertebroplasty soon °07/05/21 McGhee, James Lance, PA (Ortho - [facility]): initial visit for compression fracture. °06/12/21 Urgent Care - Pneumonia, rib pain. Rx'd Augmentin, Doxycycline, ibuprofen, tizanidine. °05/11/21 Kowalski, Bruce Jay, MD (cardiology - [facility]): f/u HTN, aortic aneurysm. Increase valsartan to 320 mg daily. °  °Hospital visits: °Medication Reconciliation was completed by comparing discharge summary, patient’s EMR and Pharmacy list, and upon discussion with patient. ° °Admitted to the hospital on 10/05/21 due to Hypokalemia. Discharge date was 10/07/21. Discharged from [facility].   °-pt was not taking potassium due to not wanting to take it; also had n/v °-overdiuresis (recently increased lasix to 40 mg BID) ° °Medication Changes at Hospital Discharge: °-Changed Lasix to 40 mg daily w/ Kcl 20 mEq daily ° °Medications that remain the same after Hospital Discharge:??  °-All other medications will remain the  same.   ° °Admitted to the hospital on 08/23/21 due to T9 Kyphoplasty. Discharge date was 08/23/21. Discharged from [facility].   ° °Admitted to the hospital on 07/20/21 due to T10 kyphoplasty. Discharge date was 07/20/21. Discharged from [facility].   ° °Admitted to the ED on 07/01/21 due to fall. Discharge date was 07/01/21. Discharged from [facility] °-Xrays and CT negative for acute abnormalities ° °Objective: ° °Lab Results  °Component Value Date  ° CREATININE 1.35 (H) 10/17/2021  ° BUN 23 10/17/2021  ° GFR 40.43 (L) 10/17/2021  ° GFRNONAA >60 10/07/2021  ° GFRAA >60 09/02/2020  ° NA 141 10/17/2021  ° K 4.6 10/17/2021  ° CALCIUM 9.4 10/17/2021  ° CO2 31 10/17/2021  ° GLUCOSE 99 10/17/2021  ° ° °Lab Results  °Component Value Date/Time  ° HGBA1C 5.8 (H) 10/05/2017 03:50 AM  ° GFR 40.43 (L) 10/17/2021 02:48 PM  ° GFR 57.33 (L) 08/29/2021 09:31 AM  °  °Last diabetic Eye exam: No results found for: HMDIABEYEEXA  °Last diabetic Foot exam: No results found for: HMDIABFOOTEX  ° °Lab Results  °Component Value Date  ° CHOL 137 12/26/2018  ° HDL 49 12/26/2018  ° LDLCALC 69 12/26/2018  ° TRIG 96 12/26/2018  ° CHOLHDL 4.3 10/05/2017  ° ° °Hepatic Function Latest Ref Rng & Units 10/06/2021 10/05/2021 08/29/2021  °Total Protein 6.5 - 8.1 g/dL 6.0(L) 6.6   6.5  °Albumin 3.5 - 5.0 g/dL 2.8(L) 3.0(L) 3.5  °AST 15 - 41 U/L 16 23 15  °ALT 0 - 44 U/L 10 14 9  °Alk Phosphatase 38 - 126 U/L 98 109 152(H)  °Total Bilirubin 0.3 - 1.2 mg/dL 0.6 0.8 0.5  °Bilirubin, Direct 0.0 - 0.2 mg/dL - - -  ° ° °Lab Results  °Component Value Date/Time  ° TSH 2.46 08/29/2021 09:31 AM  ° TSH 4.591 (H) 08/10/2020 05:49 AM  ° TSH 1.89 12/26/2018 12:00 AM  ° FREET4 1.01 08/29/2021 09:31 AM  ° ° °CBC Latest Ref Rng & Units 12/19/2021 10/06/2021 10/05/2021  °WBC 4.0 - 10.5 K/uL 9.4 6.7 10.1  °Hemoglobin 12.0 - 15.0 g/dL 10.7(L) 9.2(L) 10.3(L)  °Hematocrit 36.0 - 46.0 % 34.0(L) 28.7(L) 33.1(L)  °Platelets 150 -  400 K/uL 314 259 289  ° °Iron/TIBC/Ferritin/ %Sat °   °Component Value Date/Time  ° IRON 57 12/19/2021 1336  ° TIBC 300 12/19/2021 1336  ° FERRITIN 187 12/19/2021 1336  ° IRONPCTSAT 19 12/19/2021 1336  ° ° °Lab Results  °Component Value Date/Time  ° VD25OH 38.85 08/29/2021 09:31 AM  ° ° °Clinical ASCVD: Yes  °The ASCVD Risk score (Arnett DK, et al., 2019) failed to calculate for the following reasons: °  The patient has a prior MI or stroke diagnosis   ° °Depression screen PHQ 2/9 10/17/2021 08/28/2021 11/12/2018  °Decreased Interest 0 1 0  °Down, Depressed, Hopeless 0 1 0  °PHQ - 2 Score 0 2 0  °Altered sleeping - 1 -  °Tired, decreased energy - 1 -  °Change in appetite - 1 -  °Feeling bad or failure about yourself  - 1 -  °Trouble concentrating - 1 -  °Moving slowly or fidgety/restless - 1 -  °Suicidal thoughts - 0 -  °PHQ-9 Score - 8 -  °Difficult doing work/chores - Somewhat difficult -  °Some recent data might be hidden  °  ° °Social History  ° °Tobacco Use  °Smoking Status Former  ° Packs/day: 1.00  ° Years: 15.00  ° Pack years: 15.00  ° Types: Cigarettes  ° Quit date: 09/12/2017  ° Years since quitting: 4.3  °Smokeless Tobacco Never  ° °BP Readings from Last 3 Encounters:  °12/30/21 110/70  °12/21/21 107/62  °12/21/21 134/76  ° °Pulse Readings from Last 3 Encounters:  °12/30/21 78  °12/21/21 72  °12/21/21 85  ° °Wt Readings from Last 3 Encounters:  °12/21/21 226 lb 3.2 oz (102.6 kg)  °12/05/21 236 lb (107 kg)  °11/03/21 236 lb (107 kg)  ° °BMI Readings from Last 3 Encounters:  °12/21/21 29.84 kg/m²  °12/05/21 31.14 kg/m²  °11/03/21 31.14 kg/m²  ° ° °Assessment/Interventions: Review of patient past medical history, allergies, medications, health status, including review of consultants reports, laboratory and other test data, was performed as part of comprehensive evaluation and provision of chronic care management services.  ° °SDOH:  (Social Determinants of Health) assessments and interventions performed:  Yes ° °SDOH Screenings  ° °Alcohol Screen: Not on file  °Depression (PHQ2-9): Low Risk   ° PHQ-2 Score: 0  °Financial Resource Strain: Not on file  °Food Insecurity: Not on file  °Housing: Not on file  °Physical Activity: Not on file  °Social Connections: Not on file  °Stress: Not on file  °Tobacco Use: Medium Risk  ° Smoking Tobacco Use: Former  ° Smokeless Tobacco Use: Never  ° Passive Exposure: Not on file  °Transportation Needs: Not on file  ° ° °  CCM Care Plan  Allergies  Allergen Reactions   Meperidine Hives and Anaphylaxis   Strawberry Extract Hives and Swelling   Carafate [Sucralfate] Nausea Only and Other (See Comments)    Severe nausea   Wasp Venom Hives    Medications Reviewed Today     Reviewed by Evelina Dun, RN (Registered Nurse) on 12/21/21 at [place] List Status: <None>   Medication Order Taking? Sig Documenting Provider Last Dose Status Informant  albuterol (VENTOLIN HFA) 108 (90 Base) MCG/ACT inhaler [id number]  Inhale 1-2 puffs into the lungs every 6 (six) hours as needed for wheezing or shortness of breath. Ok Edwards, PA-C  Active Self  alendronate (FOSAMAX) 70 MG tablet [id number]  Take 1 tablet (70 mg total) by mouth every 7 (seven) days. Take with a full glass of water on an empty stomach. [name], MD  Active            Med Note Malena Catholic Oct 18, 2021  1:37 PM)    aspirin-acetaminophen-caffeine [facility] MIGRAINE) [phone] MG tablet [id number]  Take 1-2 tablets by mouth every 6 (six) hours as needed for headache. [provider]  Active   atorvastatin (LIPITOR) 40 MG tablet [id number]  Take 1 tablet (40 mg total) by mouth daily. [name], MD  Active            Med Note Malena Catholic Oct 18, 2021  1:37 PM)    carvedilol (COREG) 12.5 MG tablet [id number]  Take 12.5 mg by mouth 2 (two) times daily. [provider]  Active   cetirizine (ZYRTEC ALLERGY) 10 MG tablet [id number]  Take 1 tablet (10 mg total)  by mouth daily. Jaynee Eagles, PA-C  Active   FLUoxetine (PROZAC) 40 MG capsule [id number]  Take 40 mg by mouth daily. [provider]  Active Self           Med Note Malena Catholic Oct 18, 2021  1:37 PM)    fluticasone-salmeterol (ADVAIR [facility]) 115-21 MCG/ACT inhaler [id number]  Inhale 2 puffs into the lungs 2 (two) times daily. [name], MD  Active   furosemide (LASIX) 40 MG tablet [id number]  Take 1 tablet (40 mg total) by mouth daily. Sidney Ace, MD  Active   gabapentin (NEURONTIN) 400 MG capsule [id number]  Take 400 mg by mouth 3 (three) times daily as needed (for nerve pain). [provider]  Active Self           Med Note Malena Catholic Oct 18, 2021  1:37 PM)    hydrOXYzine (ATARAX/VISTARIL) 25 MG tablet [id number]  Take 25 mg by mouth every 8 (eight) hours as needed for anxiety. [provider]  Active Self           Med Note Charlton Haws   Wed Oct 18, 2021  1:37 PM)    ibuprofen (ADVIL) 600 MG tablet [id number]  Take 1 tablet (600 mg total) by mouth every 6 (six) hours as needed. Melynda Ripple, MD  Active   linaclotide [facility]) 145 MCG CAPS capsule [id number]  Take 145 mcg by mouth daily before breakfast.  Patient not taking: Reported on 11/03/2021   [provider]  Active   meloxicam (MOBIC) 7.5 MG tablet [id number]  Take 7.5 mg by mouth daily. [provider]  Active            Med  Note (,  N   Wed Oct 18, 2021  1:37 PM)    °mirtazapine (REMERON) 15 MG tablet [id number]  Take 15 mg by mouth at bedtime. [provider]  Active Self  °         °Med Note (,  N   Wed Oct 18, 2021  1:37 PM)    °nitrofurantoin, macrocrystal-monohydrate, (MACROBID) 100 MG capsule [id number]  Take 1 capsule (100 mg total) by mouth 2 (two) times daily. Myers, Rebecca F, PA-C  Active   °omeprazole (PRILOSEC) 40 MG capsule [id number]  TAKE 1 CAPSULE BY MOUTH EVERY DAY  °Patient taking  differently: Take 80 mg by mouth daily.  ° Cody, Jessica R, MD  Active   °         °Med Note (,  N   Wed Oct 18, 2021  1:37 PM)    °ondansetron (ZOFRAN-ODT) 4 MG disintegrating tablet [id number]  Take 1-2 tablets by mouth every 6 hours as needed Lemmon, Jennifer Lynne, PA  Active Self  °OXYGEN [id number]  Inhale 2 L/min into the lungs See admin instructions. 2 L/min at bedtime and as needed for shortness of breath throughout the day [provider]  Active Self  °polyethylene glycol-electrolytes (NULYTELY) 420 g solution [id number]  Take 4,000 mLs by mouth as directed. [provider]  Active   °         °Med Note (,  N   Wed Oct 18, 2021  1:37 PM)    °potassium chloride SA (KLOR-CON) 20 MEQ tablet [id number]  Take 1 tablet (20 mEq total) by mouth daily. With furosemide Sreenath, Sudheer B, MD  Active   °predniSONE (DELTASONE) 20 MG tablet [id number]  Take 2 tablets daily with breakfast. Mani, Mario, PA-C  Active   °valsartan (DIOVAN) 320 MG tablet [id number]  Take 320 mg by mouth daily. [provider]  Active Self  °         °Med Note (,  N   Wed Oct 18, 2021  1:38 PM)    ° °  °  ° °  ° ° °Patient Active Problem List  ° Diagnosis Date Noted  ° Hypotension 10/27/2021  ° Tremor 10/27/2021  ° Microcytic anemia 09/11/2021  ° Stage 3a chronic kidney disease (HCC) 09/11/2021  ° IDA (iron deficiency anemia) 09/11/2021  ° Elevated TSH 08/28/2021  ° Osteopenia 08/28/2021  ° DOE (dyspnea on exertion) 08/28/2021  ° Acute midline thoracic back pain 06/26/2021  ° Other chronic pain 06/26/2021  ° Chest pressure 02/20/2021  ° Left sided abdominal pain 02/01/2021  ° Chronic diastolic CHF (congestive heart failure) (HCC) 08/09/2020  ° COPD (chronic obstructive pulmonary disease) (HCC) 08/09/2020  ° Chronic respiratory failure with hypoxia (HCC) 08/09/2020  ° Polyp of sigmoid colon   ° Polyp of cecum   ° Gastritis and gastroduodenitis   ° Pylorus ulcer   ° Pyloric  stenosis in adult   ° Chronic cholecystitis 05/19/2020  ° Oxygen dependent 03/30/2020  ° Nausea and vomiting in adult 03/22/2020  ° Peptic ulcer disease 03/22/2020  ° Iron deficiency anemia 03/22/2020  ° Acute pain of right knee 12/29/2019  ° Acute left-sided low back pain without sciatica 12/29/2019  ° Chronic abdominal pain 02/19/2019  ° Asymptomatic bacteriuria 02/19/2019  ° CKD (chronic kidney disease) stage 3, GFR 30-59 ml/min (HCC) 01/13/2019  ° CHF (congestive heart failure) (HCC) 01/13/2019  ° Dizziness 01/13/2019  ° Hoarseness 11/12/2018  ° Dysphagia 11/12/2018  ° Sleepiness 11/12/2018  °   CVA (cerebral vascular accident) (HCC) 08/22/2018  ° Left-sided weakness 10/04/2017  ° HLD (hyperlipidemia) 10/04/2017  ° GERD (gastroesophageal reflux disease) 10/04/2017  ° Anxiety 08/26/2017  ° Essential hypertension   ° Depression   ° Diarrhea 02/22/2015  ° Hypokalemia 02/22/2015  ° Chronic lower back pain 04/29/2013  ° Paresthesia 04/29/2013  ° ° °Immunization History  °Administered Date(s) Administered  ° Fluad Quad(high Dose 65+) 09/30/2019, 08/29/2020, 08/28/2021  ° Influenza, High Dose Seasonal PF 08/23/2018  ° Influenza,inj,Quad PF,6+ Mos 02/24/2015  ° Moderna Sars-Covid-2 Vaccination 03/18/2020, 04/15/2020  ° Pneumococcal Conjugate-13 11/12/2018  ° Pneumococcal Polysaccharide-23 02/24/2015  ° ° °Conditions to be addressed/monitored:  °Hypertension, Hyperlipidemia, Heart Failure, GERD, COPD, Chronic Kidney Disease, Depression, Anxiety, and Osteoporosis, hx CVA ° °There are no care plans that you recently modified to display for this patient. ° ° ° °Medication Assistance: None required.  Patient affirms current coverage meets needs. ° °Compliance/Adherence/Medication fill history: °Care Gaps: °Mammogram (07/16/19) ° °Star-Rating Drugs: °Valsartan - LF11/27/22 x 90 ds; PDC 99% °Atorvastatin - LF 12/24/21 x 90 ds; PDC 65% ° °Patient's preferred pharmacy is: ° °[id number] - [place], [place] - [address] °[address] °[place] [address] °Phone: [phone] Fax: [phone] ° °[id number] - [place], [place] - [address] °[address] °[address] °[place] [address] °Phone: [phone] Fax: [phone] ° ° °Uses pill box? No - prefers bottles °Pt endorses 100% compliance ° °We discussed: Current pharmacy is preferred with insurance plan and patient is satisfied with pharmacy services °Patient decided to: Continue current medication management strategy ° °Care Plan and Follow Up Patient Decision:  Patient agrees to Care Plan and Follow-up. ° °Plan: Telephone follow up appointment with care management team member scheduled for:  3 month ° ° , PharmD, BCACP °Clinical Pharmacist °[facility] °[phone] ° ° °Current Barriers:  °Unable to independently monitor therapeutic efficacy °Suboptimal therapeutic regimen for HTN/heart failure and HLD ° °Pharmacist Clinical Goal(s):  °Patient will achieve adherence to monitoring guidelines and medication adherence to achieve therapeutic efficacy °adhere to plan to optimize therapeutic regimen for HTN/HF and HLD as evidenced by report of adherence to recommended medication management changes through collaboration with PharmD and provider.  ° °Interventions: °1:1 collaboration with Cody, Jessica R, MD regarding development and update of comprehensive plan of care as evidenced by provider attestation and co-signature °Inter-disciplinary care team collaboration (see longitudinal plan of care) °Comprehensive medication review performed; medication list updated in electronic medical record ° °Hyperlipidemia: (LDL goal < 70) °-Not ideally controlled - pt was previously off of statin therapy for close to a year (pt forgot to refill); she has restarted atorvastatin and aspirin Oct 2022 °-Hx CVA °-Current treatment:  °Atorvastatin 40 mg daily °Aspirin 81 mg daily ? °-Current exercise habits: none -  pain and SOB °-Educated on Cholesterol goals; Benefits of statin for ASCVD risk reduction; given history of CVA it is recommended she take a high-intensity statin and aspirin °-Recommend to continue current medication, repeat lipid panel with next PCP/cardiology visit ° °Heart Failure / Hypertension (Goal: BP < 140/90 and prevent exacerbations) °-Improving - Pt reports compliance with medications as below; she takes furosemide and potassium together before breakfast; she reports she is drinking fluids regularly and denies s/sx of dehydration °-Current home BP/HR readings: not checking °-Last ejection fraction: 55-65% (Date: 08/23/2018) °-HF type: Diastolic °-Current treatment: °Valsartan 320 mg daily °Carvedilol 6.25 mg BID  °Furosemide 40 mg daily °Potassium chloride 20 meq daily w/   lasix -Medications previously tried: amlodipine, carvedilol, spironolactone, telmisartan, HCTZ, isosorbide -Educated on Benefits of medications for managing symptoms and prolonging life; Proper diuretic administration and potassium supplementation; Importance of blood pressure control -Counseled on limitations of office BP monitoring alone, benefits of home BP monitoring -Advised to check BP at home daily -Recommend to continue current medication  COPD (Goal: control symptoms and prevent exacerbations) -Not ideally controlled- pt was precribed Advair yesterday and has not started it yet; she reports she gets "jittery" with albuterol and wondered if that would happen with Advair as well -Current treatment  Albuterol HFA Advair HFA (fluticasone-salmeterol) 115-21 mcg/act 2 puff BID Cetirizine 10 mg daily Supplemental O2 (noctural) -Gold Grade: unknown (no PFTs on file) -Pulmonary function testing: not on file -Exacerbations requiring treatment in last 6 months: 1? -Frequency of rescue inhaler use: daily -Advised trial of Advair BID - if not tolerated, consider LAMA monotherapy (Spiriva, Incruse)  Depression/Anxiety  (Goal: manage symtoms) -Controlled - pt reports mood is under control, current doses are fine -PHQ9: 8 (08/2021) - mild depression -GAD7: not on file -Current treatment: Fluoxetine 40 mg - 1 cap daily Hydroxyzine 25 mg q8h PRN - takes 1 daily Mirtazapine 15 mg HS -Medications previously tried/failed: Abilify, duloxetine, hydroxyzine -Recommended to continue current medication  Possible Parkinson's Disease (Goal: manage symptoms) -{US controlled/uncontrolled:25276} -Current treatment  Carbidopa/levodopa IR 25-100 mg 1/2 tab TID  -Medications previously tried: ***  -{CCMPHARMDINTERVENTION:25122}  Osteopenia (Goal prevent fractures) -Not ideally controlled - pt has been prescribed alendronate but she not started it yet due to GI issues -Last DEXA Scan: 08/10/21  T-Score total hip: -2.0  T-Score lumbar spine: -1.6  10-year probability of major osteoporotic fracture: 17.6%  10-year probability of hip fracture: 3.0% -Patient is a candidate for pharmacologic treatment due to history of vertebral fracture and T-Score -1.0 to -2.5 and 10-year risk of hip fracture > 3% -Current treatment  Alendronate 70 mg weekly (prescribed 08/2021, not started yet) -Recommend [phone] units of vitamin D daily. Recommend 1200 mg of calcium daily from dietary and supplemental sources. Counseled on oral bisphosphonate administration: take in the morning, 30 minutes prior to food with 6-8 oz of water. Do not lie down for at least 30 minutes after taking.  Back pain (Goal: manage pain) -Controlled -hx multiple falls, T10 compression fracture s/p kyphoplasty 07/20/21 -Current treatment: Gabapentin 400 mg TID prn Meloxicam 7.5 mg daily Ibuprofen 600 mg PRN -Recommended to continue current medication  GERD / dysphagia / PUD (Goal: manage symptoms) -Controlled -hx pyloric stenosis. Follows w/ GI. -Current treatment  Omeprazole 40 mg daily - 2 daily Ondansetron 4 mg PRN -Chronic PPI use is not ideal given bone  density issues; however pt has severe symptoms if she does not take PPI -Recommended to continue current medication  Chronic constipation (Goal: manage symptoms) -Controlled - pt is not taking Linzess consistently (she was using samples);  -Current treatment  Linzess 145 mcg daily PRN - not taking -Recommended to continue current medication; advised to contact GI for additional samples if needed  Health Maintenance -Vaccine gaps: TDAP, Shingrix, covid booster -Current therapy:  Excedrine Migraine (ASA-APAP-caffeine) -Patient is satisfied with current therapy and denies issues -Recommended to continue current medication  Patient Goals/Self-Care Activities Patient will:  - take medications as prescribed focus on medication adherence by pill box check blood pressure daily -Start Advair twice a day; contact PCP or PharmD if jitteriness occurs

## 2022-01-18 ENCOUNTER — Emergency Department: Payer: Medicare HMO

## 2022-01-18 ENCOUNTER — Other Ambulatory Visit: Payer: Self-pay

## 2022-01-18 ENCOUNTER — Inpatient Hospital Stay: Payer: Medicare HMO

## 2022-01-18 ENCOUNTER — Ambulatory Visit
Admission: RE | Admit: 2022-01-18 | Discharge: 2022-01-18 | Disposition: A | Discharge status: Home / Self Care | Payer: Medicare HMO | Source: Ambulatory Visit | Attending: Nephrology | Admitting: Nephrology

## 2022-01-18 ENCOUNTER — Inpatient Hospital Stay
Admission: EM | Admit: 2022-01-18 | Discharge: 2022-01-30 | DRG: 103 | Disposition: A | Discharge status: Skilled Nursing Facility | Payer: Medicare HMO | Source: Ambulatory Visit | Attending: Internal Medicine | Admitting: Internal Medicine

## 2022-01-18 DIAGNOSIS — R29818 Other symptoms and signs involving the nervous system: Secondary | ICD-10-CM | POA: Diagnosis not present

## 2022-01-18 DIAGNOSIS — R109 Unspecified abdominal pain: Secondary | ICD-10-CM | POA: Diagnosis not present

## 2022-01-18 DIAGNOSIS — Z743 Need for continuous supervision: Secondary | ICD-10-CM | POA: Diagnosis not present

## 2022-01-18 DIAGNOSIS — G43419 Hemiplegic migraine, intractable, without status migrainosus: Secondary | ICD-10-CM | POA: Diagnosis not present

## 2022-01-18 DIAGNOSIS — Z79899 Other long term (current) drug therapy: Secondary | ICD-10-CM

## 2022-01-18 DIAGNOSIS — Z8249 Family history of ischemic heart disease and other diseases of the circulatory system: Secondary | ICD-10-CM

## 2022-01-18 DIAGNOSIS — J9611 Chronic respiratory failure with hypoxia: Secondary | ICD-10-CM | POA: Diagnosis present

## 2022-01-18 DIAGNOSIS — Z20822 Contact with and (suspected) exposure to covid-19: Secondary | ICD-10-CM | POA: Diagnosis present

## 2022-01-18 DIAGNOSIS — N1831 Chronic kidney disease, stage 3a: Secondary | ICD-10-CM | POA: Diagnosis present

## 2022-01-18 DIAGNOSIS — Z7951 Long term (current) use of inhaled steroids: Secondary | ICD-10-CM

## 2022-01-18 DIAGNOSIS — Z7983 Long term (current) use of bisphosphonates: Secondary | ICD-10-CM | POA: Diagnosis not present

## 2022-01-18 DIAGNOSIS — R29810 Facial weakness: Secondary | ICD-10-CM | POA: Diagnosis present

## 2022-01-18 DIAGNOSIS — R531 Weakness: Secondary | ICD-10-CM

## 2022-01-18 DIAGNOSIS — R200 Anesthesia of skin: Secondary | ICD-10-CM | POA: Diagnosis not present

## 2022-01-18 DIAGNOSIS — I129 Hypertensive chronic kidney disease with stage 1 through stage 4 chronic kidney disease, or unspecified chronic kidney disease: Secondary | ICD-10-CM | POA: Diagnosis present

## 2022-01-18 DIAGNOSIS — D631 Anemia in chronic kidney disease: Secondary | ICD-10-CM | POA: Diagnosis not present

## 2022-01-18 DIAGNOSIS — Z83438 Family history of other disorder of lipoprotein metabolism and other lipidemia: Secondary | ICD-10-CM

## 2022-01-18 DIAGNOSIS — I6389 Other cerebral infarction: Secondary | ICD-10-CM | POA: Diagnosis not present

## 2022-01-18 DIAGNOSIS — G20 Parkinson's disease: Secondary | ICD-10-CM | POA: Diagnosis present

## 2022-01-18 DIAGNOSIS — D72829 Elevated white blood cell count, unspecified: Secondary | ICD-10-CM | POA: Diagnosis present

## 2022-01-18 DIAGNOSIS — M5124 Other intervertebral disc displacement, thoracic region: Secondary | ICD-10-CM | POA: Diagnosis not present

## 2022-01-18 DIAGNOSIS — N189 Chronic kidney disease, unspecified: Secondary | ICD-10-CM | POA: Diagnosis not present

## 2022-01-18 DIAGNOSIS — Z7982 Long term (current) use of aspirin: Secondary | ICD-10-CM

## 2022-01-18 DIAGNOSIS — R32 Unspecified urinary incontinence: Secondary | ICD-10-CM | POA: Diagnosis not present

## 2022-01-18 DIAGNOSIS — Z7401 Bed confinement status: Secondary | ICD-10-CM | POA: Diagnosis not present

## 2022-01-18 DIAGNOSIS — Z888 Allergy status to other drugs, medicaments and biological substances status: Secondary | ICD-10-CM

## 2022-01-18 DIAGNOSIS — I509 Heart failure, unspecified: Secondary | ICD-10-CM | POA: Diagnosis not present

## 2022-01-18 DIAGNOSIS — I639 Cerebral infarction, unspecified: Secondary | ICD-10-CM | POA: Diagnosis not present

## 2022-01-18 DIAGNOSIS — J449 Chronic obstructive pulmonary disease, unspecified: Secondary | ICD-10-CM | POA: Diagnosis present

## 2022-01-18 DIAGNOSIS — Z736 Limitation of activities due to disability: Secondary | ICD-10-CM | POA: Diagnosis not present

## 2022-01-18 DIAGNOSIS — E871 Hypo-osmolality and hyponatremia: Secondary | ICD-10-CM | POA: Diagnosis present

## 2022-01-18 DIAGNOSIS — K219 Gastro-esophageal reflux disease without esophagitis: Secondary | ICD-10-CM | POA: Diagnosis present

## 2022-01-18 DIAGNOSIS — R202 Paresthesia of skin: Secondary | ICD-10-CM | POA: Diagnosis not present

## 2022-01-18 DIAGNOSIS — I341 Nonrheumatic mitral (valve) prolapse: Secondary | ICD-10-CM | POA: Diagnosis present

## 2022-01-18 DIAGNOSIS — R2990 Unspecified symptoms and signs involving the nervous system: Secondary | ICD-10-CM | POA: Diagnosis not present

## 2022-01-18 DIAGNOSIS — R42 Dizziness and giddiness: Secondary | ICD-10-CM | POA: Diagnosis not present

## 2022-01-18 DIAGNOSIS — R55 Syncope and collapse: Secondary | ICD-10-CM | POA: Diagnosis not present

## 2022-01-18 DIAGNOSIS — H5340 Unspecified visual field defects: Secondary | ICD-10-CM | POA: Diagnosis not present

## 2022-01-18 DIAGNOSIS — M6259 Muscle wasting and atrophy, not elsewhere classified, multiple sites: Secondary | ICD-10-CM | POA: Diagnosis not present

## 2022-01-18 DIAGNOSIS — R4781 Slurred speech: Secondary | ICD-10-CM | POA: Diagnosis present

## 2022-01-18 DIAGNOSIS — Z9981 Dependence on supplemental oxygen: Secondary | ICD-10-CM | POA: Diagnosis not present

## 2022-01-18 DIAGNOSIS — Z87891 Personal history of nicotine dependence: Secondary | ICD-10-CM

## 2022-01-18 DIAGNOSIS — G43409 Hemiplegic migraine, not intractable, without status migrainosus: Secondary | ICD-10-CM | POA: Diagnosis not present

## 2022-01-18 DIAGNOSIS — R1311 Dysphagia, oral phase: Secondary | ICD-10-CM | POA: Diagnosis not present

## 2022-01-18 DIAGNOSIS — I10 Essential (primary) hypertension: Secondary | ICD-10-CM | POA: Diagnosis present

## 2022-01-18 DIAGNOSIS — Z833 Family history of diabetes mellitus: Secondary | ICD-10-CM

## 2022-01-18 DIAGNOSIS — E876 Hypokalemia: Secondary | ICD-10-CM | POA: Diagnosis present

## 2022-01-18 DIAGNOSIS — I6523 Occlusion and stenosis of bilateral carotid arteries: Secondary | ICD-10-CM | POA: Diagnosis not present

## 2022-01-18 DIAGNOSIS — I5032 Chronic diastolic (congestive) heart failure: Secondary | ICD-10-CM | POA: Diagnosis not present

## 2022-01-18 DIAGNOSIS — I700 Atherosclerosis of aorta: Secondary | ICD-10-CM | POA: Diagnosis not present

## 2022-01-18 DIAGNOSIS — G459 Transient cerebral ischemic attack, unspecified: Secondary | ICD-10-CM | POA: Diagnosis not present

## 2022-01-18 DIAGNOSIS — R8271 Bacteriuria: Secondary | ICD-10-CM | POA: Diagnosis present

## 2022-01-18 DIAGNOSIS — I672 Cerebral atherosclerosis: Secondary | ICD-10-CM | POA: Diagnosis not present

## 2022-01-18 DIAGNOSIS — N1832 Chronic kidney disease, stage 3b: Secondary | ICD-10-CM

## 2022-01-18 DIAGNOSIS — G43009 Migraine without aura, not intractable, without status migrainosus: Secondary | ICD-10-CM | POA: Diagnosis not present

## 2022-01-18 DIAGNOSIS — Z7952 Long term (current) use of systemic steroids: Secondary | ICD-10-CM | POA: Diagnosis not present

## 2022-01-18 DIAGNOSIS — R5381 Other malaise: Secondary | ICD-10-CM | POA: Diagnosis not present

## 2022-01-18 DIAGNOSIS — R29898 Other symptoms and signs involving the musculoskeletal system: Secondary | ICD-10-CM | POA: Diagnosis not present

## 2022-01-18 LAB — URINALYSIS, MICROSCOPIC (REFLEX)

## 2022-01-18 LAB — DIFFERENTIAL
Abs Immature Granulocytes: 0.04 10*3/uL (ref 0.00–0.07)
Basophils Absolute: 0 10*3/uL (ref 0.0–0.1)
Basophils Relative: 0 %
Eosinophils Absolute: 0.1 10*3/uL (ref 0.0–0.5)
Eosinophils Relative: 1 %
Immature Granulocytes: 0 %
Lymphocytes Relative: 13 %
Lymphs Abs: 1.4 10*3/uL (ref 0.7–4.0)
Monocytes Absolute: 0.5 10*3/uL (ref 0.1–1.0)
Monocytes Relative: 5 %
Neutro Abs: 8.9 10*3/uL — ABNORMAL HIGH (ref 1.7–7.7)
Neutrophils Relative %: 81 %

## 2022-01-18 LAB — URINALYSIS, ROUTINE W REFLEX MICROSCOPIC
Bilirubin Urine: NEGATIVE
Glucose, UA: NEGATIVE mg/dL
Hgb urine dipstick: NEGATIVE
Ketones, ur: NEGATIVE mg/dL
Nitrite: NEGATIVE
Protein, ur: NEGATIVE mg/dL
Specific Gravity, Urine: <1.005 — ABNORMAL LOW (ref 1.005–1.030)
pH: 5 (ref 5.0–8.0)

## 2022-01-18 LAB — URINE DRUG SCREEN, QUALITATIVE (ARMC ONLY)
Amphetamines, Ur Screen: NOT DETECTED
Barbiturates, Ur Screen: NOT DETECTED
Benzodiazepine, Ur Scrn: NOT DETECTED
Cannabinoid 50 Ng, Ur ~~LOC~~: NOT DETECTED
Cocaine Metabolite,Ur ~~LOC~~: NOT DETECTED
MDMA (Ecstasy)Ur Screen: NOT DETECTED
Methadone Scn, Ur: NOT DETECTED
Opiate, Ur Screen: NOT DETECTED
Phencyclidine (PCP) Ur S: NOT DETECTED
Tricyclic, Ur Screen: NOT DETECTED

## 2022-01-18 LAB — CBC
HCT: 30 % — ABNORMAL LOW (ref 36.0–46.0)
Hemoglobin: 9.2 g/dL — ABNORMAL LOW (ref 12.0–15.0)
MCH: 26.5 pg (ref 26.0–34.0)
MCHC: 30.7 g/dL (ref 30.0–36.0)
MCV: 86.5 fL (ref 80.0–100.0)
Platelets: 226 10*3/uL (ref 150–400)
RBC: 3.47 MIL/uL — ABNORMAL LOW (ref 3.87–5.11)
RDW: 15.3 % (ref 11.5–15.5)
WBC: 10.9 10*3/uL — ABNORMAL HIGH (ref 4.0–10.5)
nRBC: 0 % (ref 0.0–0.2)

## 2022-01-18 LAB — COMPREHENSIVE METABOLIC PANEL
ALT: 6 U/L (ref 0–44)
AST: 14 U/L — ABNORMAL LOW (ref 15–41)
Albumin: 3.2 g/dL — ABNORMAL LOW (ref 3.5–5.0)
Alkaline Phosphatase: 108 U/L (ref 38–126)
Anion gap: 7 (ref 5–15)
BUN: 24 mg/dL — ABNORMAL HIGH (ref 8–23)
CO2: 27 mmol/L (ref 22–32)
Calcium: 8 mg/dL — ABNORMAL LOW (ref 8.9–10.3)
Chloride: 99 mmol/L (ref 98–111)
Creatinine, Ser: 1.41 mg/dL — ABNORMAL HIGH (ref 0.44–1.00)
GFR, Estimated: 41 mL/min — ABNORMAL LOW (ref >60)
Glucose, Bld: 106 mg/dL — ABNORMAL HIGH (ref 70–99)
Potassium: 4.2 mmol/L (ref 3.5–5.1)
Sodium: 133 mmol/L — ABNORMAL LOW (ref 135–145)
Total Bilirubin: 0.3 mg/dL (ref 0.3–1.2)
Total Protein: 6.1 g/dL — ABNORMAL LOW (ref 6.5–8.1)

## 2022-01-18 LAB — PROTIME-INR
INR: 1 (ref 0.8–1.2)
Prothrombin Time: 13 seconds (ref 11.4–15.2)

## 2022-01-18 LAB — RESP PANEL BY RT-PCR (FLU A&B, COVID) ARPGX2
Influenza A by PCR: NEGATIVE
Influenza B by PCR: NEGATIVE
SARS Coronavirus 2 by RT PCR: NEGATIVE

## 2022-01-18 LAB — MRSA NEXT GEN BY PCR, NASAL: MRSA by PCR Next Gen: NOT DETECTED

## 2022-01-18 LAB — PROCALCITONIN: Procalcitonin: <0.1 ng/mL

## 2022-01-18 LAB — APTT: aPTT: 28 seconds (ref 24–36)

## 2022-01-18 LAB — ETHANOL: Alcohol, Ethyl (B): <10 mg/dL (ref <10)

## 2022-01-18 LAB — GLUCOSE, CAPILLARY
Glucose-Capillary: 76 mg/dL (ref 70–99)
Glucose-Capillary: 85 mg/dL (ref 70–99)

## 2022-01-18 LAB — CBG MONITORING, ED: Glucose-Capillary: 98 mg/dL (ref 70–99)

## 2022-01-18 MED ORDER — PANTOPRAZOLE SODIUM 40 MG IV SOLR
40.0000 mg | Freq: Every day | INTRAVENOUS | Status: DC
  Administered 2022-01-18 – 2022-01-25 (×8): 40 mg via INTRAVENOUS
  Filled 2022-01-18: qty 10
  Filled 2022-01-18 (×2): qty 40
  Filled 2022-01-18: qty 10
  Filled 2022-01-18: qty 40
  Filled 2022-01-18: qty 10
  Filled 2022-01-18 (×2): qty 40

## 2022-01-18 MED ORDER — IOHEXOL 350 MG/ML SOLN
75.0000 mL | Freq: Once | INTRAVENOUS | Status: AC | PRN
  Administered 2022-01-18: 75 mL via INTRAVENOUS

## 2022-01-18 MED ORDER — LABETALOL HCL 5 MG/ML IV SOLN: 10.0000 mg | INTRAVENOUS | Status: DC | PRN

## 2022-01-18 MED ORDER — ACETAMINOPHEN 650 MG RE SUPP
650.0000 mg | RECTAL | Status: DC | PRN
  Administered 2022-01-19: 650 mg via RECTAL
  Filled 2022-01-18: qty 1

## 2022-01-18 MED ORDER — ACETAMINOPHEN 10 MG/ML IV SOLN
1000.0000 mg | Freq: Once | INTRAVENOUS | Status: AC
  Administered 2022-01-18: 1000 mg via INTRAVENOUS
  Filled 2022-01-18: qty 100

## 2022-01-18 MED ORDER — STROKE: EARLY STAGES OF RECOVERY BOOK: Freq: Once | Status: DC

## 2022-01-18 MED ORDER — ACETAMINOPHEN 160 MG/5ML PO SOLN
650.0000 mg | ORAL | Status: DC | PRN
  Filled 2022-01-18 (×2): qty 20.3

## 2022-01-18 MED ORDER — TENECTEPLASE FOR STROKE
25.0000 mg | PACK | Freq: Once | INTRAVENOUS | Status: AC
  Administered 2022-01-18: 25 mg via INTRAVENOUS

## 2022-01-18 MED ORDER — SODIUM CHLORIDE 0.9 % IV SOLN: INTRAVENOUS | Status: DC

## 2022-01-18 MED ORDER — CHLORHEXIDINE GLUCONATE CLOTH 2 % EX PADS
6.0000 | MEDICATED_PAD | Freq: Every day | CUTANEOUS | Status: DC
  Administered 2022-01-18 – 2022-01-21 (×4): 6 via TOPICAL

## 2022-01-18 MED ORDER — IOHEXOL 350 MG/ML SOLN: 75.0000 mL | Freq: Once | INTRAVENOUS | Status: DC | PRN

## 2022-01-18 MED ORDER — ACETAMINOPHEN 325 MG PO TABS
650.0000 mg | ORAL_TABLET | ORAL | Status: DC | PRN
  Administered 2022-01-20 – 2022-01-28 (×10): 650 mg via ORAL
  Filled 2022-01-18 (×9): qty 2

## 2022-01-18 MED ORDER — SENNOSIDES-DOCUSATE SODIUM 8.6-50 MG PO TABS
1.0000 | ORAL_TABLET | Freq: Every evening | ORAL | Status: DC | PRN
  Administered 2022-01-30: 05:00:00 1 via ORAL
  Filled 2022-01-18: qty 1

## 2022-01-18 NOTE — Progress Notes (Signed)
OT Cancellation Note  Patient Details Name: Morgan [name] MRN: [id number] DOB: April 12, 1951   Cancelled Treatment:    Reason Eval/Treat Not Completed: Patient not medically ready. Consult received, chart reviewed. Pt arrived to ED s/p ischemic stroke. TNK administered at 11:19am on 01/18/22. Per guidelines, pt to remain on strict bedrest for initial 24 hours and until repeat CT is perform and pt is cleared for activity. Will continue to follow and perform evaluation when pt is medically appropriate.   Ardeth Perfect., MPH, MS, OTR/L ascom [phone] 01/18/22, 2:09 PM

## 2022-01-18 NOTE — Progress Notes (Signed)
Notified by Dr. Mortimer Fries that patient was c/o headache and abd pain. I examined her at bedside. Headache is 4-5/10 with photophobia. NIHSS unchanged = 6. Neurologic exam unchanged. Head CT unchanged, no e/o blood. CT abdomen pending.   Su Monks, MD [facility] [phone]  If 7pm- 7am, please page neurology on call as listed in [place].

## 2022-01-18 NOTE — Progress Notes (Signed)
SLP Cancellation Note  Patient Details Name: Morgan [name] MRN: [id number] DOB: December 23, 1950   Cancelled treatment:       Reason Eval/Treat Not Completed: Patient not medically ready (chart reviewed). Pt arrived to ED s/p ischemic stroke. TNK administered at 11:19am on 01/18/22. Will Hold on evaluation for 24 hours and until repeat CT is perform. Will continue to follow and perform evaluation when pt is medically appropriate.     Orinda Kenner, MS, CCC-SLP Speech Language Pathologist Rehab Services; [place] [phone] (ascom) Meliza Kage 01/18/2022, 5:11 PM

## 2022-01-18 NOTE — ED Notes (Signed)
Pharmacy tech at bedside 

## 2022-01-18 NOTE — ED Notes (Signed)
Pt's pastor at bedside.  

## 2022-01-18 NOTE — Code Documentation (Signed)
Stroke Response Nurse Documentation Code Documentation  Morgan [name] is a 71 y.o. female arriving to [facility] ED via [place] on 01/18/2022. On No antithrombotic. Code stroke was activated by ED triage RN.   Patient from doctors appointment where she was to have an ultrasound. Pt stood up and had sudden faint feeling and states that she passed out and had left sided weakness. LKW at 1000 and now complaining of same symptoms.  Stroke team at the bedside on patient arrival. Labs drawn and patient cleared for CT by Dr. Jacqualine Code. Patient to CT with team. NIHSS 6, see documentation for details and code stroke times. The following imaging was completed:  CT, CTA head and neck. Patient is a candidate for IV Thrombolytic per Dr Quinn Axe. TNK administered at 1119.  Care/Plan: TNK administered, pt to have monitoring per policy and admission to TNK.   Bedside handoff with ED RN Ariel.    Velta Addison Stroke Designer, fashion/clothing

## 2022-01-18 NOTE — H&P (Signed)
NAME:  Morgan [name], MRN:  [id number], DOB:  10/26/1951, LOS: 0 ADMISSION DATE:  01/18/2022, CONSULTATION DATE:  01/18/22 REFERRING MD:  Dr. Jacqualine Code, CHIEF COMPLAINT:  Syncope, Dizziness, Numbness  Brief Pt Description / Synopsis:  71 y.o. female who presented with acute syncope, left sided weakness/heaviness and facial droop.  CODE STROKE called, status post TNK.  History of Present Illness:  Morgan [name] is a 71 year old female with a past medical history significant for CHF, hypertension, mitral valve prolapse, E. coli UTIs, Parkinson's disease, anemia, and arthritis who presented to [facility] ED on 01/18/2022 due to complaints of syncope, left-sided weakness/heaviness, left-sided facial droop.  Per patient's report, she was at her doctors office earlier this morning getting a renal ultrasound.  When she stood up she became dizzy with syncopal episode (denies hitting head or any trauma).  When she came to, she exhibited left-sided weakness/heaviness, numbness, facial droop, and visual field cuts.  Prior to today's incident, the patient denies any previous symptoms.  ED Course: Initial vital signs: Temperature 97.6 F orally, respiratory rate 16, pulse 71, blood pressure 148/81, SPO2 99% on room air Significant labs: Sodium 133, glucose 106, BUN 24, creatinine 1.41, calcium 8.0, albumin 3.2, WBC 10.9 with neutrophilia, hemoglobin 9.2, hematocrit 30, INR 1.0, PT 13.0, APTT 28, ethyl alcohol less than 10 COVID-19 PCR is negative Urinalysis with small leukocytes and many bacteria Urine drug screen negative Imaging: CT head without contrast>>IMPRESSION: No evidence of acute large vascular territory infarct or acute hemorrhage. ASPECTS is 10. CTA head neck>> IMPRESSION: No large vessel occlusion. Plaque at the right ICA origin causes approximately 60% stenosis. Plaque at the left ICA origin causes approximately 50% stenosis. Medications given: TNKase @ 11:19  Given patient's symptoms, code stroke was  called.  She was evaluated weighted by neurology, and was deemed a candidate for TNKase.  Patient was in agreement, therefore TNKase administered at 1119.  PCCM is asked admit the patient to ICU for further work-up and treatment and close monitoring.  Pertinent  Medical History  Congestive heart failure Hypertension Mitral valve prolapse E. Coli UTI's Parkinson's disease Anemia Arthritis  Micro Data:  2/2: SARS-CoV-2 and influenza PCR>>negative 2/2: Urine>>  Antimicrobials:    Significant Hospital Events: Including procedures, antibiotic start and stop dates in addition to other pertinent events   2/2: Presented to ED with stroke symptoms, received TNKase.  Neurology following, PCCM asked to admit  Interim History / Subjective:  -Patient is status post TN K -Continues to exhibit left-sided weakness/heaviness, numbness, facial droop with slurred speech -Afebrile, hemodynamically stable, blood pressure currently well controlled -Patient also reports abdominal tenderness to palpitation ~ will obtain CT Abdomen/Pelvis  Objective   Blood pressure (!) 152/80, pulse 70, temperature 97.6 F (36.4 C), temperature source Oral, resp. rate 16, height 6\' 1"  (1.854 m), weight 107.5 kg, SpO2 98 %.       No intake or output data in the 24 hours ending 01/18/22 1226 Filed Weights   01/18/22 1054 01/18/22 1100  Weight: 107.5 kg 107.5 kg    Examination: General: Acutely ill-appearing female, laying in bed, on room air, no acute distress HENT: Atraumatic, normocephalic, neck supple, no JVD Lungs: Clear breath sounds bilaterally, even, nonlabored, currently protecting airway Cardiovascular: Regular rate and rhythm, S1-S2, no murmurs, rubs, gallops Abdomen: Soft, very tender to palpation throughout, no guarding or rebound tenderness Extremities: Left-sided weakness/heaviness/numbness, no deformities Neuro: Awake, alert and oriented x4, follows commands with noted left-sided weakness, pupils  PERRLA GU: External catheter  in place  Resolved Hospital Problem list     Assessment & Plan:   Acute Ischemic Stroke s/p TNK PMHx: Hypertension, Mitral valve Prolapse -ICU monitoring -Frequent neurochecks and NIHSS scores per post-TNK protocol -Neurology following, appreciate input -Obtain stat head CT for any change in neurological exam -MRI brain pending -CTA Head & Neck negative for large vessel occlusion -Repeat noncontrast head CT 24 hours post TNKase to rule out hemorrhagic conversion -No antiplatelets or anticoagulation for 24 hours post TNKase, until [facility] ruled out on repeat imaging at 24 hours -Maintain SBP <180 and/or DBP <105 -As needed labetalol and nicardipine drip if needed to maintain blood pressure goal -SCDs for DVT prophylaxis -N.p.o., bedside swallow study pending -PT/OT/speech therapy consult -Check hemoglobin A1c and fasting lipid panel  Mild Leukocytosis Acute Abdominal Pain PMHx: E. 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 -Monitor fever curve -Trend WBC's & Procalcitonin -Follow cultures as above -UA with small leukocytes and many bacteria -Will hold off on empiric ABX for now with low threshold to start ABX ~ will await PCT and culture (pt denies any dysuria, frequency) -Obtain CT Abdomen & Pelvis  Acute Kidney Injury Mild Hyponatremia -Monitor I&O's / urinary output -Follow BMP -Ensure adequate renal perfusion -Avoid nephrotoxic agents as able -Replace electrolytes as indicated -Gentle IV fluids -Renal ultrasound obtained outpatient earlier today  Anemia of chronic disease -Monitor for S/Sx of bleeding -Trend CBC -SCD's for VTE Prophylaxis  -Transfuse for Hgb <7   Best Practice (right click and "Reselect all SmartList Selections" daily)   Diet/type: NPO DVT prophylaxis: SCD GI prophylaxis: PPI Lines: N/A Foley:  N/A Code Status:  full code Last date of multidisciplinary goals of care discussion [N/A]  Pt and her  niece updated at bedside 01/18/22.  All questions answered.  Labs   CBC: Recent Labs  Lab 01/18/22 1137  WBC 10.9*  NEUTROABS 8.9*  HGB 9.2*  HCT 30.0*  MCV 86.5  PLT 671    Basic Metabolic Panel: Recent Labs  Lab 01/18/22 1137  NA 133*  K 4.2  CL 99  CO2 27  GLUCOSE 106*  BUN 24*  CREATININE 1.41*  CALCIUM 8.0*   GFR: Estimated Creatinine Clearance: 53.2 mL/min (A) (by C-G formula based on SCr of 1.41 mg/dL (H)). Recent Labs  Lab 01/18/22 1137  WBC 10.9*    Liver Function Tests: Recent Labs  Lab 01/18/22 1137  AST 14*  ALT 6  ALKPHOS 108  BILITOT 0.3  PROT 6.1*  ALBUMIN 3.2*   No results for input(s): LIPASE, AMYLASE in the last 168 hours. No results for input(s): AMMONIA in the last 168 hours.  ABG    Component Value Date/Time   PHART 7.49 (H) 08/29/2017 1650   PCO2ART 56 (H) 08/29/2017 1650   PO2ART 114 (H) 08/29/2017 1650   HCO3 42.7 (H) 08/29/2017 1650   TCO2 30 05/15/2016 1550   O2SAT 98.8 08/29/2017 1650     Coagulation Profile: Recent Labs  Lab 01/18/22 1137  INR 1.0    Cardiac Enzymes: No results for input(s): CKTOTAL, CKMB, CKMBINDEX, TROPONINI in the last 168 hours.  HbA1C: Hgb A1c MFr Bld  Date/Time Value Ref Range Status  10/05/2017 03:50 AM 5.8 (H) 4.8 - 5.6 % Final    Comment:    (NOTE) Pre diabetes:          5.7%-6.4% Diabetes:              >6.4% Glycemic control for   <7.0% adults  with diabetes     CBG: Recent Labs  Lab 01/18/22 1052  GLUCAP 98    Review of Systems:   Positives in BOLD: Gen: Denies fever, chills, weight change, fatigue, night sweats HEENT: Denies blurred vision, double vision, hearing loss, tinnitus, sinus congestion, rhinorrhea, sore throat, neck stiffness, dysphagia PULM: Denies shortness of breath, cough, sputum production, hemoptysis, wheezing CV: Denies chest pain, edema, orthopnea, paroxysmal nocturnal dyspnea, palpitations GI: Denies abdominal pain, nausea, vomiting, diarrhea,  hematochezia, melena, constipation, change in bowel habits GU: Denies dysuria, hematuria, polyuria, oliguria, urethral discharge Endocrine: Denies hot or cold intolerance, polyuria, polyphagia or appetite change Derm: Denies rash, dry skin, scaling or peeling skin change Heme: Denies easy bruising, bleeding, bleeding gums Neuro: Denies headache, numbness, weakness, slurred speech, loss of memory or consciousness   Past Medical History:  She,  has a past medical history of Allergy, Anemia, Anxiety, Arthritis, CHF (congestive heart failure) (HCC), Depression, GERD (gastroesophageal reflux disease), Hypertension, MVP (mitral valve prolapse), and Parkinson disease ([place]).   Surgical History:   Past Surgical History:  Procedure Laterality Date   ABDOMINAL HYSTERECTOMY     APPENDECTOMY     BALLOON DILATION N/A 04/21/2020   Procedure: BALLOON DILATION;  Surgeon: Milus Banister, MD;  Location: [facility];  Service: Endoscopy;  Laterality: N/A;  pyloric/ GI   BALLOON DILATION N/A 07/04/2020   Procedure: BALLOON DILATION;  Surgeon: Mauri Pole, MD;  Location: [facility];  Service: Endoscopy;  Laterality: N/A;   BIOPSY  04/21/2020   Procedure: BIOPSY;  Surgeon: Milus Banister, MD;  Location: [facility];  Service: Endoscopy;;   BIOPSY  07/04/2020   Procedure: BIOPSY;  Surgeon: Mauri Pole, MD;  Location: [facility];  Service: Endoscopy;;  EGD and COLON   BREAST SURGERY Right 1988   tumor removal   CHOLECYSTECTOMY N/A 05/18/2020   Procedure: LAPAROSCOPIC CHOLECYSTECTOMY;  Surgeon: Johnathan Hausen, MD;  Location: [facility];  Service: General;  Laterality: N/A;   COLONOSCOPY WITH PROPOFOL N/A 07/04/2020   Procedure: COLONOSCOPY WITH PROPOFOL;  Surgeon: Mauri Pole, MD;  Location: [facility];  Service: Endoscopy;  Laterality: N/A;   COLONOSCOPY WITH PROPOFOL N/A 11/03/2021   Procedure: COLONOSCOPY WITH PROPOFOL;  Surgeon: Lesly Rubenstein, MD;  Location: [facility];   Service: Endoscopy;  Laterality: N/A;   ESOPHAGEAL DILATION  08/11/2020   Procedure: PYLORIC DILATION;  Surgeon: Doran Stabler, MD;  Location: [facility];  Service: Gastroenterology;;   ESOPHAGOGASTRODUODENOSCOPY (EGD) WITH PROPOFOL N/A 04/21/2020   Procedure: ESOPHAGOGASTRODUODENOSCOPY (EGD) WITH PROPOFOL;  Surgeon: Milus Banister, MD;  Location: [facility];  Service: Endoscopy;  Laterality: N/A;   ESOPHAGOGASTRODUODENOSCOPY (EGD) WITH PROPOFOL N/A 07/04/2020   Procedure: ESOPHAGOGASTRODUODENOSCOPY (EGD) WITH PROPOFOL;  Surgeon: Mauri Pole, MD;  Location: [facility];  Service: Endoscopy;  Laterality: N/A;   ESOPHAGOGASTRODUODENOSCOPY (EGD) WITH PROPOFOL N/A 08/11/2020   Procedure: ESOPHAGOGASTRODUODENOSCOPY (EGD) WITH PROPOFOL;  Surgeon: Doran Stabler, MD;  Location: [facility];  Service: Gastroenterology;  Laterality: N/A;   ESOPHAGOGASTRODUODENOSCOPY (EGD) WITH PROPOFOL N/A 11/03/2021   Procedure: ESOPHAGOGASTRODUODENOSCOPY (EGD) WITH PROPOFOL;  Surgeon: Lesly Rubenstein, MD;  Location: [facility];  Service: Endoscopy;  Laterality: N/A;   KNEE SURGERY  2005   2 times left   KNEE SURGERY Right    KYPHOPLASTY N/A 07/20/2021   Procedure: T 10KYPHOPLASTY;  Surgeon: Hessie Knows, MD;  Location: [facility];  Service: Orthopedics;  Laterality: N/A;   KYPHOPLASTY N/A 08/23/2021   Procedure: T9 KYPHOPLASTY;  Surgeon:  Hessie Knows, MD;  Location: [facility];  Service: Orthopedics;  Laterality: N/A;   POLYPECTOMY  07/04/2020   Procedure: POLYPECTOMY;  Surgeon: Mauri Pole, MD;  Location: [facility];  Service: Endoscopy;;   TUBAL LIGATION       Social History:   reports that she quit smoking about 4 years ago. Her smoking use included cigarettes. She has a 15.00 pack-year smoking history. She has never used smokeless tobacco. She reports that she does not currently use alcohol. She reports that she does not use drugs.   Family History:  Her family history includes  Brain cancer in her mother; Breast cancer (age of onset: 76) in her mother; Breast cancer (age of onset: 65) in her maternal aunt and maternal aunt; Breast cancer (age of onset: 75) in her paternal grandmother; Breast cancer (age of onset: 22) in her maternal grandmother; Colon cancer in her father, maternal grandfather, and maternal uncle; Diabetes in her paternal grandmother; Heart disease in her father; Hyperlipidemia in her father; Hypertension in her father; Liver disease in her maternal grandfather; Other in her son; Ovarian cancer (age of onset: 43) in her mother. There is no history of Stomach cancer, Pancreatic cancer, or Esophageal cancer.   Allergies Allergies  Allergen Reactions   Meperidine Hives and Anaphylaxis   Strawberry Extract Hives and Swelling   Carafate [Sucralfate] Nausea Only and Other (See Comments)    Severe nausea   Wasp Venom Hives     Home Medications  Prior to Admission medications   Medication Sig Start Date End Date Taking? Authorizing Provider  albuterol (VENTOLIN HFA) 108 (90 Base) MCG/ACT inhaler Inhale 1-2 puffs into the lungs every 6 (six) hours as needed for wheezing or shortness of breath. 10/31/20   Tasia Catchings, Amy V, PA-C  alendronate (FOSAMAX) 70 MG tablet Take 1 tablet (70 mg total) by mouth every 7 (seven) days. Take with a full glass of water on an empty stomach. 08/28/21   [name], MD  aspirin-acetaminophen-caffeine (EXCEDRIN MIGRAINE) [phone] MG tablet Take 1-2 tablets by mouth every 6 (six) hours as needed for headache.    [provider]  atorvastatin (LIPITOR) 40 MG tablet Take 1 tablet (40 mg total) by mouth daily. 09/27/21   [name], MD  carbidopa-levodopa (SINEMET IR) 25-100 MG tablet Take 1 tablet by mouth 3 (three) times daily. 11/21/21   [provider]  carvedilol (COREG) 12.5 MG tablet Take 12.5 mg by mouth 2 (two) times daily. 10/25/21   [provider]  cetirizine (ZYRTEC ALLERGY) 10 MG tablet Take 1  tablet (10 mg total) by mouth daily. 09/12/21   Jaynee Eagles, PA-C  FLUoxetine (PROZAC) 40 MG capsule Take 40 mg by mouth daily.    [provider]  fluticasone-salmeterol (ADVAIR HFA) 115-21 MCG/ACT inhaler Inhale 2 puffs into the lungs 2 (two) times daily. 10/17/21   [name], MD  furosemide (LASIX) 40 MG tablet Take 1 tablet (40 mg total) by mouth daily. 10/07/21   Sidney Ace, MD  gabapentin (NEURONTIN) 400 MG capsule Take 400 mg by mouth 3 (three) times daily as needed (for nerve pain). 10/20/18   [provider]  hydrOXYzine (ATARAX/VISTARIL) 25 MG tablet Take 25 mg by mouth every 8 (eight) hours as needed for anxiety. 08/22/21   [provider]  ibuprofen (ADVIL) 600 MG tablet Take 1 tablet (600 mg total) by mouth every 6 (six) hours as needed. 06/12/21   Melynda Ripple, MD  KLOR-CON M20 20  MEQ tablet TAKE 1 TABLET BY MOUTH EVERY DAY 12/22/21   [name], MD  linaclotide [facility]) 145 MCG CAPS capsule Take 145 mcg by mouth daily before breakfast. Patient not taking: Reported on 11/03/2021    [provider]  meloxicam (MOBIC) 7.5 MG tablet Take 7.5 mg by mouth daily. 09/18/21   [provider]  methocarbamol (ROBAXIN) 500 MG tablet Take 1 tablet (500 mg total) by mouth 2 (two) times daily as needed for muscle spasms. 12/30/21   Teodora Medici, FNP  mirtazapine (REMERON) 15 MG tablet Take 15 mg by mouth at bedtime. 10/20/20   [provider]  nitrofurantoin, macrocrystal-monohydrate, (MACROBID) 100 MG capsule Take 1 capsule (100 mg total) by mouth 2 (two) times daily. 10/29/21   Francene Finders, PA-C  omeprazole (PRILOSEC) 40 MG capsule TAKE 1 CAPSULE BY MOUTH EVERY DAY Patient taking differently: Take 80 mg by mouth daily. 08/14/21   [name], MD  ondansetron (ZOFRAN-ODT) 4 MG disintegrating tablet Take 1-2 tablets by mouth every 6 hours as needed 03/03/21   Levin Erp, PA  OXYGEN Inhale 2 L/min into the lungs  See admin instructions. 2 L/min at bedtime and as needed for shortness of breath throughout the day    [provider]  polyethylene glycol-electrolytes (NULYTELY) 420 g solution Take 4,000 mLs by mouth as directed. 09/21/21   [provider]  predniSONE (DELTASONE) 20 MG tablet Take 2 tablets daily with breakfast. 09/20/21   Jaynee Eagles, PA-C  valsartan (DIOVAN) 320 MG tablet Take 320 mg by mouth daily. 05/11/21   [provider]     Critical care time: 50 minutes     Darel Hong, AGACNP-BC [facility] [place] epic messenger for cross cover needs If after hours, please call E-link

## 2022-01-18 NOTE — ED Triage Notes (Signed)
Pt reports having an u/s of her kidneys and started to feel dizzy and stood up to walk and passed out. Pt reports that the left side of her face, left arm and left leg feel numb, tingling, and heavy, pt has some left sided facial asymetry and weakness and when asked to stick her tongue out she has difficulty doing so her tongue moves to the right

## 2022-01-18 NOTE — ED Notes (Signed)
Pt taken for CT 

## 2022-01-18 NOTE — ED Notes (Signed)
Pt taken for scans 

## 2022-01-18 NOTE — Consult Note (Addendum)
NEUROLOGY CONSULTATION NOTE   Date of service: January 18, 2022 Patient Name: Morgan [name] MRN:  [id number] DOB:  1951-02-15 Reason for consult: stroke code Requesting physician: Dr. Delman Kitten _ _ _   _ __   _ __ _ _  __ __   _ __   __ _  History of Present Illness   This is a 71 year old woman with history of Parkinson's disease, hypertension, CHF who presented to the emergency department after developing vertigo and left-sided weakness at a doctor's appointment this morning.  Last known well was 10 AM and she was normal prior to this.  She then developed severe vertigo and was unable to see in both eyes.  It sounds like she may have had syncope with this and had to be caught by a nurse.  She did not have any head trauma with it.  CT head showed no acute intracranial process and no blood.  NIH stroke scale was 6 (1 questions, 1 partial hemianopia, 1 facial droop, 1 LUE motor, 1 LLE motor, 1 sensory deficit). Risks and benefits of TNK were discussed with patient and she was confirmed to meet inclusion criteria and not meet any exclusion criteria. She gave informed consent to proceed. CTA H&N showed no LVO. Patient to be admitted to ICU for post-TNK monitoring.  CNS imaging personally reviewed.  ROS   Per HPI: all other systems reviewed and are negative  Past History   I have reviewed the following:  Past Medical History:  Diagnosis Date   Allergy    Anemia    Anxiety    Arthritis    CHF (congestive heart failure) (HCC)    Depression    GERD (gastroesophageal reflux disease)    Hypertension    MVP (mitral valve prolapse)    Parkinson disease ([place])    Past Surgical History:  Procedure Laterality Date   ABDOMINAL HYSTERECTOMY     APPENDECTOMY     BALLOON DILATION N/A 04/21/2020   Procedure: BALLOON DILATION;  Surgeon: Milus Banister, MD;  Location: [facility];  Service: Endoscopy;  Laterality: N/A;  pyloric/ GI   BALLOON DILATION N/A 07/04/2020   Procedure: BALLOON DILATION;   Surgeon: Mauri Pole, MD;  Location: [facility];  Service: Endoscopy;  Laterality: N/A;   BIOPSY  04/21/2020   Procedure: BIOPSY;  Surgeon: Milus Banister, MD;  [facility]: [facility];  Service: Endoscopy;;   BIOPSY  07/04/2020   Procedure: BIOPSY;  Surgeon: Mauri Pole, MD;  [facility]: [facility];  Service: Endoscopy;;  EGD and COLON   BREAST SURGERY Right 1988   tumor removal   CHOLECYSTECTOMY N/A 05/18/2020   Procedure: LAPAROSCOPIC CHOLECYSTECTOMY;  Surgeon: Johnathan Hausen, MD;  Location: [facility];  Service: General;  Laterality: N/A;   COLONOSCOPY WITH PROPOFOL N/A 07/04/2020   Procedure: COLONOSCOPY WITH PROPOFOL;  Surgeon: Mauri Pole, MD;  [facility];  Service: Endoscopy;  Laterality: N/A;   COLONOSCOPY WITH PROPOFOL N/A 11/03/2021   Procedure: COLONOSCOPY WITH PROPOFOL;  Surgeon: Lesly Rubenstein, MD;  Location: [facility];  Service: Endoscopy;  Laterality: N/A;   ESOPHAGEAL DILATION  08/11/2020   Procedure: PYLORIC DILATION;  Surgeon: Doran Stabler, MD;  [facility]: [facility];  Service: Gastroenterology;;   ESOPHAGOGASTRODUODENOSCOPY (EGD) WITH PROPOFOL N/A 04/21/2020   Procedure: ESOPHAGOGASTRODUODENOSCOPY (EGD) WITH PROPOFOL;  Surgeon: Milus Banister, MD;  [facility];  Service: Endoscopy;  Laterality: N/A;   ESOPHAGOGASTRODUODENOSCOPY (EGD) WITH PROPOFOL N/A 07/04/2020   Procedure:  ESOPHAGOGASTRODUODENOSCOPY (EGD) WITH PROPOFOL;  Surgeon: Mauri Pole, MD;  [facility];  Service: Endoscopy;  Laterality: N/A;   ESOPHAGOGASTRODUODENOSCOPY (EGD) WITH PROPOFOL N/A 08/11/2020   Procedure: ESOPHAGOGASTRODUODENOSCOPY (EGD) WITH PROPOFOL;  Surgeon: Doran Stabler, MD;  [facility];  Service: Gastroenterology;  Laterality: N/A;   ESOPHAGOGASTRODUODENOSCOPY (EGD) WITH PROPOFOL N/A 11/03/2021   Procedure: ESOPHAGOGASTRODUODENOSCOPY (EGD) WITH PROPOFOL;  Surgeon: Lesly Rubenstein, MD;  Location: [facility];  Service: Endoscopy;  Laterality: N/A;   KNEE SURGERY  2005   2 times left   KNEE SURGERY Right    KYPHOPLASTY N/A 07/20/2021   Procedure: T 10KYPHOPLASTY;  Surgeon: Hessie Knows, MD;  Location: [facility];  Service: Orthopedics;  Laterality: N/A;   KYPHOPLASTY N/A 08/23/2021   Procedure: T9 KYPHOPLASTY;  Surgeon: Hessie Knows, MD;  Location: [facility];  Service: Orthopedics;  Laterality: N/A;   POLYPECTOMY  07/04/2020   Procedure: POLYPECTOMY;  Surgeon: Mauri Pole, MD;  [facility];  Service: Endoscopy;;   TUBAL LIGATION     Family History  Problem Relation Age of Onset   Breast cancer Mother 49   Ovarian cancer Mother 51   Brain cancer Mother    Hypertension Father    Hyperlipidemia Father    Heart disease Father    Colon cancer Father        diagnosed in his 68s   Breast cancer Maternal Grandmother 2   Diabetes Paternal Grandmother    Breast cancer Paternal Grandmother 96   Other Son        drug over dose   Colon cancer Maternal Grandfather        dx in his 71s   Liver disease Maternal Grandfather    Breast cancer Maternal Aunt 60   Breast cancer Maternal Aunt 60   Colon cancer Maternal Uncle    Stomach cancer Neg Hx    Pancreatic cancer Neg Hx    Esophageal cancer Neg Hx    Social History   Socioeconomic History   Marital status: Single    Spouse name: Not on file   Number of children: 2   Years of education: Not on file   Highest education level: Not on file  Occupational History   Not on file  Tobacco Use   Smoking status: Former    Packs/day: 1.00    Years: 15.00    Pack years: 15.00    Types: Cigarettes    Quit date: 09/12/2017    Years since quitting: 4.3   Smokeless tobacco: Never  Vaping Use   Vaping Use: Never used  Substance and Sexual Activity   Alcohol use: Not Currently   Drug use: No   Sexual activity: Not Currently  Other Topics Concern   Not on file  Social History Narrative   Lives with Grandson's great  grandparents    [name] - 25 years old ([place])   Daughter - [name] - lives in the [place] system - family, aunt, [name] and [name] (who she lives with), brother   Transportation: roommates   Enjoy: [employer], playing with grandson   Exercise: PT exercises   Diet: good - chicken, some steak, tries to eat healthy, avoids fried foods   Retired from being a Quarry manager   Social Determinants of Radio broadcast assistant Strain: Not on file  Food Insecurity: Not on file  Transportation Needs: Not on file  Physical Activity: Not on file  Stress: Not on file  Social Connections: Not on file   Allergies  Allergen Reactions   Meperidine Hives and Anaphylaxis   Strawberry Extract Hives and Swelling   Carafate [Sucralfate] Nausea Only and Other (See Comments)    Severe nausea   Wasp Venom Hives    Medications   (Not in a hospital admission)    No current facility-administered medications for this encounter.  Current Outpatient Medications:    albuterol (VENTOLIN HFA) 108 (90 Base) MCG/ACT inhaler, Inhale 1-2 puffs into the lungs every 6 (six) hours as needed for wheezing or shortness of breath., Disp: 18 g, Rfl: 0   alendronate (FOSAMAX) 70 MG tablet, Take 1 tablet (70 mg total) by mouth every 7 (seven) days. Take with a full glass of water on an empty stomach., Disp: 4 tablet, Rfl: 11   aspirin-acetaminophen-caffeine (EXCEDRIN MIGRAINE) 250-250-65 MG tablet, Take 1-2 tablets by mouth every 6 (six) hours as needed for headache., Disp: , Rfl:    atorvastatin (LIPITOR) 40 MG tablet, Take 1 tablet (40 mg total) by mouth daily., Disp: 90 tablet, Rfl: 1   carbidopa-levodopa (SINEMET IR) 25-100 MG tablet, Take 1 tablet by mouth 3 (three) times daily., Disp: , Rfl:    carvedilol (COREG) 12.5 MG tablet, Take 12.5 mg by mouth 2 (two) times daily., Disp: , Rfl:    cetirizine (ZYRTEC ALLERGY) 10 MG tablet, Take 1 tablet (10 mg total) by mouth daily., Disp: 30 tablet, Rfl: 0    FLUoxetine (PROZAC) 40 MG capsule, Take 40 mg by mouth daily., Disp: , Rfl:    fluticasone-salmeterol (ADVAIR HFA) 115-21 MCG/ACT inhaler, Inhale 2 puffs into the lungs 2 (two) times daily., Disp: 1 each, Rfl: 2   furosemide (LASIX) 40 MG tablet, Take 1 tablet (40 mg total) by mouth daily., Disp: 30 tablet, Rfl: 0   gabapentin (NEURONTIN) 400 MG capsule, Take 400 mg by mouth 3 (three) times daily as needed (for nerve pain)., Disp: , Rfl:    hydrOXYzine (ATARAX/VISTARIL) 25 MG tablet, Take 25 mg by mouth every 8 (eight) hours as needed for anxiety., Disp: , Rfl:    ibuprofen (ADVIL) 600 MG tablet, Take 1 tablet (600 mg total) by mouth every 6 (six) hours as needed., Disp: 30 tablet, Rfl: 0   KLOR-CON M20 20 MEQ tablet, TAKE 1 TABLET BY MOUTH EVERY DAY, Disp: 90 tablet, Rfl: 1   linaclotide (LINZESS) 145 MCG CAPS capsule, Take 145 mcg by mouth daily before breakfast. (Patient not taking: Reported on 11/03/2021), Disp: , Rfl:    meloxicam (MOBIC) 7.5 MG tablet, Take 7.5 mg by mouth daily., Disp: , Rfl:    methocarbamol (ROBAXIN) 500 MG tablet, Take 1 tablet (500 mg total) by mouth 2 (two) times daily as needed for muscle spasms., Disp: 20 tablet, Rfl: 0   mirtazapine (REMERON) 15 MG tablet, Take 15 mg by mouth at bedtime., Disp: , Rfl:    nitrofurantoin, macrocrystal-monohydrate, (MACROBID) 100 MG capsule, Take 1 capsule (100 mg total) by mouth 2 (two) times daily., Disp: 10 capsule, Rfl: 0   omeprazole (PRILOSEC) 40 MG capsule, TAKE 1 CAPSULE BY MOUTH EVERY DAY (Patient taking differently: Take 80 mg by mouth daily.), Disp: 90 capsule, Rfl: 3   ondansetron (ZOFRAN-ODT) 4 MG disintegrating tablet, Take 1-2 tablets by mouth every 6 hours as needed, Disp: 360 tablet, Rfl: 3   OXYGEN, Inhale 2 L/min into the lungs See admin instructions. 2 L/min at bedtime and as needed for shortness of breath throughout the day, Disp: , Rfl:  polyethylene glycol-electrolytes (NULYTELY) 420 g solution, Take 4,000 mLs by  mouth as directed., Disp: , Rfl:    predniSONE (DELTASONE) 20 MG tablet, Take 2 tablets daily with breakfast., Disp: 10 tablet, Rfl: 0   valsartan (DIOVAN) 320 MG tablet, Take 320 mg by mouth daily., Disp: , Rfl:   Vitals   Vitals:   01/18/22 1100 01/18/22 1119 01/18/22 1142 01/18/22 1150  BP:  (!) 155/71 (!) 144/80   Pulse:   72 73  Resp:   20 18  Temp:      TempSrc:      SpO2:   100% 100%  Weight: 107.5 kg     Height:         Body mass index is 31.27 kg/m.  Physical Exam   Physical Exam Gen: alert, oriented to self, [facility], and year but not month HEENT: Atraumatic, normocephalic;mucous membranes moist; oropharynx clear, tongue without atrophy or fasciculations. Neck: Supple, trachea midline. Resp: CTAB, no w/r/r CV: RRR, no m/g/r; nml S1 and S2. 2+ symmetric peripheral pulses. Abd: soft/NT/ND; nabs x 4 quad Extrem: Nml bulk; no cyanosis, clubbing, or edema.  Neuro: *MS: alert, oriented to self, [facility], and year but not month *Speech: fluid, nondysarthric, able to name and repeat *CN:    I: Deferred   II,III: PERRLA, unable to distinguish between 1 and 2 fingers in L lateral hemifield of L eye only, otherwise VFF, optic discs unable to be visualized 2/2 pupillary constriction   III,IV,VI: EOMI w/o nystagmus, no ptosis   V: impaired to LT L face relative to R   VII: Eyelid closure was full.  L UMN facial droop   VIII: Hearing intact to voice   IX,X: Voice normal, palate elevates symmetrically    XI: SCM/trap 5/5 bilat   XII: Tongue protrudes midline, no atrophy or fasciculations  *Motor:   Normal bulk.  No tremor, rigidity or bradykinesia. Drift in LUE and LLE but not to bed. RUE and RLE full strength. Resting and postural tremor LUE>LLE (hx Parkinson's) *Sensory: Impaired to LT LUE and LLE. No double-simultaneous extinction.  *Coordination:  FNF intact on R *Reflexes:  2+ and symmetric throughout without clonus; toes down-going bilat *Gait: deferred  NIHSS  1a Level  of Conscious.: 0 1b LOC Questions: 1 1c LOC Commands: 0 2 Best Gaze: 0 3 Visual: 1 4 Facial Palsy: 1 5a Motor Arm - left: 1 5b Motor Arm - Right: 0 6a Motor Leg - Left: 1 6b Motor Leg - Right: 0 7 Limb Ataxia: 0 8 Sensory: 1 9 Best Language: 0 10 Dysarthria: 0 11 Extinct. and Inatten.: 0  TOTAL: 6   Premorbid mRS = 0   Labs   CBC: No results for input(s): WBC, NEUTROABS, HGB, HCT, MCV, PLT in the last 168 hours.  Basic Metabolic Panel:  Lab Results  Component Value Date   NA 141 10/17/2021   K 4.6 10/17/2021   CO2 31 10/17/2021   GLUCOSE 99 10/17/2021   BUN 23 10/17/2021   CREATININE 1.35 (H) 10/17/2021   CALCIUM 9.4 10/17/2021   GFRNONAA >60 10/07/2021   GFRAA >60 09/02/2020   Lipid Panel:  Lab Results  Component Value Date   LDLCALC 69 12/26/2018   HgbA1c:  Lab Results  Component Value Date   HGBA1C 5.8 (H) 10/05/2017   Urine Drug Screen: No results found for: LABOPIA, COCAINSCRNUR, LABBENZ, AMPHETMU, THCU, LABBARB  Alcohol Level No results found for: [place]   Impression   71 year old woman with history of  Parkinson's disease, hypertension, CHF who presented to the emergency department after developing vertigo and left-sided weakness at a doctor's appointment this morning, now s/p TNK. No LVO on CTA.  Recommendations   - Admit to ICU - Neurochecks and NIHSS documentation per post-TNK protocol - STAT head CT for any change in neurologic exam - Non-con head CT 24 hrs post-TNK r/o hemorrhagic conversion - MRI brain wo contrast - TTE - no aspirin for 24 hours post TNK and until ICH ruled out imaging at 24 hrs - keep SBP less than 180 for the 1st 24 hours post TNK to reduce the risk of hemorrhagic transformation - SCDs for DVT prophylaxis; can start SQ Heparin if head CT 24 hours post TNK is negative for ICH - NPO; swallow study - PT/OT and speech therapy - stroke education - outpatient f/u with neurology after discharge   This patient is critically  ill and at significant risk of neurological worsening, death and care requires constant monitoring of vital signs, hemodynamics,respiratory and cardiac monitoring, neurological assessment, discussion with family, other specialists and medical decision making of high complexity. I spent 70 minutes of neurocritical care time  in the care of  this patient. This was time spent independent of any time provided by nurse practitioner or PA.  Su Monks, MD [facility] [phone]  If 7pm- 7am, please page neurology on call as listed in [place].

## 2022-01-18 NOTE — Progress Notes (Signed)
PT Cancellation Note  Patient Details Name: CHARLAYNE VULTAGGIO MRN: [id number] DOB: 1951-01-18   Cancelled Treatment:    Reason Eval/Treat Not Completed: Medical issues which prohibited therapy. Consult received and chart reviewed. Pt arrived to ED s/p ischemic stroke. TNK administered at 11:19am on 01/18/22. Per guidelines, pt to remain on strict bedrest for initial 24 hours and until repeat CT is perform and pt is cleared for activity. Will continue to follow and perform evaluation when pt is medically appropriate.    Patrina Levering PT, DPT 01/18/22 2:04 PM [phone]

## 2022-01-18 NOTE — Plan of Care (Signed)
°  Problem: Education: Goal: Knowledge of General Education information will improve Description: Including pain rating scale, medication(s)/side effects and non-pharmacologic comfort measures Outcome: Progressing   Problem: Health Behavior/Discharge Planning: Goal: Ability to manage health-related needs will improve Outcome: Progressing   Problem: Clinical Measurements: Goal: Ability to maintain clinical measurements within normal limits will improve Outcome: Progressing Goal: Will remain free from infection Outcome: Progressing Goal: Diagnostic test results will improve Outcome: Progressing Goal: Cardiovascular complication will be avoided Outcome: Progressing   Problem: Activity: Goal: Risk for activity intolerance will decrease Outcome: Progressing   Problem: Nutrition: Goal: Adequate nutrition will be maintained Outcome: Progressing   Problem: Coping: Goal: Level of anxiety will decrease Outcome: Progressing   Problem: Elimination: Goal: Will not experience complications related to bowel motility Outcome: Progressing Goal: Will not experience complications related to urinary retention Outcome: Progressing   Problem: Pain Managment: Goal: General experience of comfort will improve Outcome: Progressing   Problem: Safety: Goal: Ability to remain free from injury will improve Outcome: Progressing   Problem: Skin Integrity: Goal: Risk for impaired skin integrity will decrease Outcome: Progressing   Problem: Education: Goal: Knowledge of disease or condition will improve Outcome: Progressing Goal: Knowledge of secondary prevention will improve (SELECT ALL) Outcome: Progressing Goal: Knowledge of patient specific risk factors will improve (INDIVIDUALIZE FOR PATIENT) Outcome: Progressing Goal: Individualized Educational Video(s) Outcome: Progressing

## 2022-01-18 NOTE — Progress Notes (Signed)
CODE STROKE- PHARMACY COMMUNICATION   Time CODE STROKE called/page received: 1113  Time response to CODE STROKE was made (in person or via phone): 1113  Time Stroke Kit retrieved from [place] (only if needed): 1115  Name of Provider/Nurse contacted: Dr. Quinn Axe  Past Medical History:  Diagnosis Date   Allergy    Anemia    Anxiety    Arthritis    CHF (congestive heart failure) ([place])    Depression    GERD (gastroesophageal reflux disease)    Hypertension    MVP (mitral valve prolapse)    Parkinson disease ([place])    Prior to Admission medications   Medication Sig Start Date End Date Taking? Authorizing Provider  albuterol (VENTOLIN HFA) 108 (90 Base) MCG/ACT inhaler Inhale 1-2 puffs into the lungs every 6 (six) hours as needed for wheezing or shortness of breath. 10/31/20   Tasia Catchings, Amy V, PA-C  alendronate (FOSAMAX) 70 MG tablet Take 1 tablet (70 mg total) by mouth every 7 (seven) days. Take with a full glass of water on an empty stomach. 08/28/21   [name], MD  aspirin-acetaminophen-caffeine (EXCEDRIN MIGRAINE) [phone] MG tablet Take 1-2 tablets by mouth every 6 (six) hours as needed for headache.    [provider]  atorvastatin (LIPITOR) 40 MG tablet Take 1 tablet (40 mg total) by mouth daily. 09/27/21   [name], MD  carbidopa-levodopa (SINEMET IR) 25-100 MG tablet Take 1 tablet by mouth 3 (three) times daily. 11/21/21   [provider]  carvedilol (COREG) 12.5 MG tablet Take 12.5 mg by mouth 2 (two) times daily. 10/25/21   [provider]  cetirizine (ZYRTEC ALLERGY) 10 MG tablet Take 1 tablet (10 mg total) by mouth daily. 09/12/21   Jaynee Eagles, PA-C  FLUoxetine (PROZAC) 40 MG capsule Take 40 mg by mouth daily.    [provider]  fluticasone-salmeterol (ADVAIR HFA) 115-21 MCG/ACT inhaler Inhale 2 puffs into the lungs 2 (two) times daily. 10/17/21   [name], MD  furosemide (LASIX) 40 MG tablet Take 1 tablet (40 mg total) by mouth  daily. 10/07/21   Sidney Ace, MD  gabapentin (NEURONTIN) 400 MG capsule Take 400 mg by mouth 3 (three) times daily as needed (for nerve pain). 10/20/18   [provider]  hydrOXYzine (ATARAX/VISTARIL) 25 MG tablet Take 25 mg by mouth every 8 (eight) hours as needed for anxiety. 08/22/21   [provider]  ibuprofen (ADVIL) 600 MG tablet Take 1 tablet (600 mg total) by mouth every 6 (six) hours as needed. 06/12/21   Melynda Ripple, MD  KLOR-CON M20 20 MEQ tablet TAKE 1 TABLET BY MOUTH EVERY DAY 12/22/21   [name], MD  linaclotide [facility]) 145 MCG CAPS capsule Take 145 mcg by mouth daily before breakfast. Patient not taking: Reported on 11/03/2021    [provider]  meloxicam (MOBIC) 7.5 MG tablet Take 7.5 mg by mouth daily. 09/18/21   [provider]  methocarbamol (ROBAXIN) 500 MG tablet Take 1 tablet (500 mg total) by mouth 2 (two) times daily as needed for muscle spasms. 12/30/21   Teodora Medici, FNP  mirtazapine (REMERON) 15 MG tablet Take 15 mg by mouth at bedtime. 10/20/20   [provider]  nitrofurantoin, macrocrystal-monohydrate, (MACROBID) 100 MG capsule Take 1 capsule (100 mg total) by mouth 2 (two) times daily. 10/29/21   Francene Finders, PA-C  omeprazole (PRILOSEC) 40 MG capsule TAKE 1 CAPSULE BY MOUTH EVERY DAY Patient taking  differently: Take 80 mg by mouth daily. 08/14/21   [name], MD  ondansetron (ZOFRAN-ODT) 4 MG disintegrating tablet Take 1-2 tablets by mouth every 6 hours as needed 03/03/21   Levin Erp, PA  OXYGEN Inhale 2 L/min into the lungs See admin instructions. 2 L/min at bedtime and as needed for shortness of breath throughout the day    [provider]  polyethylene glycol-electrolytes (NULYTELY) 420 g solution Take 4,000 mLs by mouth as directed. 09/21/21   [provider]  predniSONE (DELTASONE) 20 MG tablet Take 2 tablets daily with breakfast. 09/20/21   Jaynee Eagles, PA-C   valsartan (DIOVAN) 320 MG tablet Take 320 mg by mouth daily. 05/11/21   [provider]    Sherilyn Banker ,PharmD Clinical Pharmacist  01/18/2022  11:34 AM

## 2022-01-18 NOTE — Progress Notes (Signed)
PT admitted to ICU Code Stroke. Ch sat with Pt during initial evaluations. Pt mostly needed to rest, chatted a little bit. Asked Ch to phone Pt's pastor, who arrived around 1230

## 2022-01-18 NOTE — ED Notes (Signed)
Activated [place] W/[facility] 11:12am

## 2022-01-18 NOTE — ED Provider Notes (Signed)
[facility] Provider Note    Event Date/Time   First MD Initiated Contact with Patient 01/18/22 1055     (approximate)   History   Dizziness, Loss of Consciousness, and Numbness  EM caveat, some limitation due to acuity of condition acute neurologic deficit  HPI  Morgan [name] is a 71 y.o. female was having an ultrasound performed when she stood up afterwards got very lightheaded.  She briefly passed out.  Denies injury.  She then noticed that she could not see well out of her left eye, weakness on her left arm and left leg and felt heavy on the left side  She has been in her normal state of health until this event occurred today.  No chest pain no trouble breathing.  Denies abdominal pain.   She reports she feels a little bit of difficulty swallowing  Physical Exam   Triage Vital Signs: ED Triage Vitals [01/18/22 1054]  Enc Vitals Group     BP (!) 148/81     Pulse Rate 71     Resp 16     Temp 97.6 F (36.4 C)     Temp Source Oral     SpO2 99 %     Weight      Height      Head Circumference      Peak Flow      Pain Score      Pain Loc      Pain Edu?      Excl. in [place]?     Most recent vital signs: Vitals:   01/18/22 1205 01/18/22 1220  BP: (!) 152/80 (!) 153/88  Pulse: 70 69  Resp: 16 (!) 26  Temp:    SpO2: 98% 99%     General: Awake, no distress.  CV:  Good peripheral perfusion.  Resp:  Normal effort.  Abd:  No distention.  Other:  Neurologic: Tongue protrudes mildly to the left.  There is no intraoral edema.  There is no angioedema, no rash no itching.  She reports feeling weak on her left arm left leg and there is a mild facial droop on the left side.  Please see stroke team documentation for full NIH   ED Results / Procedures / Treatments   Labs (all labs ordered are listed, but only abnormal results are displayed) Labs Reviewed  CBC - Abnormal; Notable for the following components:      Result Value   WBC 10.9 (*)     RBC 3.47 (*)    Hemoglobin 9.2 (*)    HCT 30.0 (*)    All other components within normal limits  DIFFERENTIAL - Abnormal; Notable for the following components:   Neutro Abs 8.9 (*)    All other components within normal limits  COMPREHENSIVE METABOLIC PANEL - Abnormal; Notable for the following components:   Sodium 133 (*)    Glucose, Bld 106 (*)    BUN 24 (*)    Creatinine, Ser 1.41 (*)    Calcium 8.0 (*)    Total Protein 6.1 (*)    Albumin 3.2 (*)    AST 14 (*)    GFR, Estimated 41 (*)    All other components within normal limits  RESP PANEL BY RT-PCR (FLU A&B, COVID) ARPGX2  PROTIME-INR  APTT  URINE DRUG SCREEN, QUALITATIVE ([facility] ONLY)  URINALYSIS, ROUTINE W REFLEX MICROSCOPIC  ETHANOL  CBG MONITORING, ED     EKG  Reviewed inter by me at  1147 Heart rate 79 QRS 110 QTC 500 Normal sinus rhythm, mild T wave abnormality occluding slight inversion in V2 and V3 but no obvious evidence of frank ischemia   RADIOLOGY CT HEAD CODE STROKE WO CONTRAST`  Result Date: 01/18/2022 CLINICAL DATA:  Code stroke.  Neuro deficit, acute, stroke suspected EXAM: CT HEAD WITHOUT CONTRAST TECHNIQUE: Contiguous axial images were obtained from the base of the skull through the vertex without intravenous contrast. RADIATION DOSE REDUCTION: This exam was performed according to the departmental dose-optimization program which includes automated exposure control, adjustment of the mA and/or kV according to patient size and/or use of iterative reconstruction technique. COMPARISON:  CT head 07/01/2021. FINDINGS: Brain: No evidence of acute large vascular territory infarction, acute hemorrhage, hydrocephalus, extra-axial collection or mass lesion/mass effect. Vascular: No hyperdense vessel identified. Calcific intracranial atherosclerosis. Skull: No acute fracture. Sinuses/Orbits: Clear visualized sinuses.  Unremarkable orbits. Other: No mastoid effusions. ASPECTS [facility] Stroke Program Early CT Score)  total score (0-10 with 10 being normal): 10. IMPRESSION: No evidence of acute large vascular territory infarct or acute hemorrhage. ASPECTS is 10. Code stroke imaging results were communicated on 01/18/2022 at 11:12 am to provider [facility] via secure text paging. Electronically Signed   By: Margaretha Sheffield M.D.   On: 01/18/2022 11:14   CT ANGIO HEAD NECK W WO CM (CODE STROKE)  Result Date: 01/18/2022 CLINICAL DATA:  Neuro deficit, acute, stroke suspected EXAM: CT ANGIOGRAPHY HEAD AND NECK TECHNIQUE: Multidetector CT imaging of the head and neck was performed using the standard protocol during bolus administration of intravenous contrast. Multiplanar CT image reconstructions and MIPs were obtained to evaluate the vascular anatomy. Carotid stenosis measurements (when applicable) are obtained utilizing NASCET criteria, using the distal internal carotid diameter as the denominator. RADIATION DOSE REDUCTION: This exam was performed according to the departmental dose-optimization program which includes automated exposure control, adjustment of the mA and/or kV according to patient size and/or use of iterative reconstruction technique. CONTRAST:  66mL OMNIPAQUE IOHEXOL 350 MG/ML SOLN COMPARISON:  None. FINDINGS: CTA NECK Aortic arch: Great vessel origins are patent with calcified plaque present. Right carotid system: Patent. Mixed but predominantly calcified plaque at the proximal ICA causing 60% stenosis. Left carotid system: Patent. Calcified plaque along the proximal ICA causing 50% stenosis. Vertebral arteries: Patent and codominant.  No stenosis. Skeleton: Degenerative changes of the cervical spine. Other neck: Unremarkable. Upper chest: No apical lung mass. Review of the MIP images confirms the above findings CTA HEAD Anterior circulation: Intracranial internal carotid arteries are patent with mild calcified plaque. Anterior and middle cerebral arteries are patent. Posterior circulation: Intracranial vertebral  arteries are patent. Basilar artery is patent. Major cerebellar artery origins are patent. Posterior cerebral arteries are patent. Venous sinuses: Patent as allowed by contrast bolus timing. Review of the MIP images confirms the above findings IMPRESSION: No large vessel occlusion. Plaque at the right ICA origin causes approximately 60% stenosis. Plaque at the left ICA origin causes approximately 50% stenosis. Electronically Signed   By: Macy Mis M.D.   On: 01/18/2022 11:49    CT angiography reviewed, negative for large vessel occlusion but see radiologist report for further  CT head reviewed, personally interpreted by me.  No gross abnormality such as intracranial hemorrhage denoted    PROCEDURES:  Critical Care performed: Yes, see critical care procedure note(s)  CRITICAL CARE Performed by: Delman Kitten   Total critical care time: 30 minutes  Critical care time was exclusive of separately billable procedures and treating other  patients.  Critical care was necessary to treat or prevent imminent or life-threatening deterioration.  Critical care was time spent personally by me on the following activities: development of treatment plan with patient and/or surrogate as well as nursing, discussions with consultants, evaluation of patient's response to treatment, examination of patient, obtaining history from patient or surrogate, ordering and performing treatments and interventions, ordering and review of laboratory studies, ordering and review of radiographic studies, pulse oximetry and re-evaluation of patient's condition.   Procedures   MEDICATIONS ORDERED IN ED: Medications   stroke: mapping our early stages of recovery book (has no administration in time range)  0.9 %  sodium chloride infusion (has no administration in time range)  acetaminophen (TYLENOL) tablet 650 mg (has no administration in time range)    Or  acetaminophen (TYLENOL) 160 MG/5ML solution 650 mg (has no  administration in time range)    Or  acetaminophen (TYLENOL) suppository 650 mg (has no administration in time range)  senna-docusate (Senokot-S) tablet 1 tablet (has no administration in time range)  pantoprazole (PROTONIX) injection 40 mg (has no administration in time range)  labetalol (NORMODYNE) injection 10 mg (has no administration in time range)  tenecteplase (TNKASE) injection for Stroke 25 mg (25 mg Intravenous Given 01/18/22 1119)  iohexol (OMNIPAQUE) 350 MG/ML injection 75 mL (75 mLs Intravenous Contrast Given 01/18/22 1123)     IMPRESSION / MDM / ASSESSMENT AND PLAN / ED COURSE  I reviewed the triage vital signs and the nursing notes.                              Differential diagnosis includes, but is not limited to, syncope possibly vasovagal, cardiogenic, or other etiology.  Also given her symptomatology and presentation with left-sided weakness at high risk for possible acute ischemic stroke.  No evidence of hemorrhage.  No evidence of injury after the syncopal event.  The patient was seen emergently by her stroke consultant after a code stroke activation, and determination was made that the patient should receive TN K which was administered by the stroke neurologist Dr. Quinn Axe.  Upon my evaluation the patient does have findings and clinical history concerning for acute ischemic stroke.  No evidence of LVO.  She has received TNKase at this point, and we will continue to monitor her carefully.  I discussed her case with the ICU doctor Kasa who will admit.   Further labs pending at this time.  The patient is slated to be admitted to the ICU.  EKG does not demonstrate evidence of acute arrhythmia or frank ischemia but further work-up pending.  ----------------------------------------- 12:32 PM on 01/18/2022 -----------------------------------------  Labs reviewed interpreted by me.  Mild anemia does seem to have a baseline anemia though.  Also mild increase in  creatinine   Patient admitted to the ICU service now.    Clinical Course as of 01/18/22 1233  Thu Jan 18, 2022  1056 Went to evaluate patient, no patient in room [MQ]  1106 Patient currently being triaged, a stat head CT has been ordered.  Awaiting arrival to ED bed for further assessment but going directly CT [MQ]  1116 Patient still at CT scan.  I personally reviewed her CT head imaging, I do not see gross abnormality.  Certainly refer to the radiologist for more detailed report and exclusion of intracranial hemorrhage [MQ]  1116 Discussed with Dr. Rebeca Allegra, Dr. Quinn Axe advises symptoms consistent with acute stroke.  [MQ]  1117 Discussed with Dr. Quinn Axe our neurologist who is seeing the patient.  She advised that the presentation is concerning for acute ischemic stroke and she anticipates ordering thrombolytic.  She advises the patient does not have contraindication to thrombolysis and appears appropriate candidate. [MQ]  1007 Stroke order set entered at this time. [MQ]  1219 Discussed with stroke nurse, patient remained made NPO.  Patient is reporting some difficulty with swallowing [MQ]  1231 Labs reviewed, notable for a mild anemia.  Mild increase in patient's baseline creatinine to 1.4 today.  Mild reduction in GFR. [MQ]    Clinical Course User Index [MQ] Delman Kitten, MD     FINAL CLINICAL IMPRESSION(S) / ED DIAGNOSES   Final diagnoses:  Left-sided weakness     Rx / DC Orders   ED Discharge Orders     None        Note:  This document was prepared using Dragon voice recognition software and may include unintentional dictation errors.   Delman Kitten, MD 01/18/22 1233

## 2022-01-19 ENCOUNTER — Inpatient Hospital Stay: Payer: Medicare HMO

## 2022-01-19 ENCOUNTER — Encounter: Payer: Self-pay | Admitting: Internal Medicine

## 2022-01-19 ENCOUNTER — Inpatient Hospital Stay
Admit: 2022-01-19 | Discharge: 2022-01-19 | Disposition: A | Discharge status: Home / Self Care | Payer: Medicare HMO | Attending: Pulmonary Disease | Admitting: Pulmonary Disease

## 2022-01-19 DIAGNOSIS — G43009 Migraine without aura, not intractable, without status migrainosus: Secondary | ICD-10-CM

## 2022-01-19 DIAGNOSIS — I639 Cerebral infarction, unspecified: Secondary | ICD-10-CM | POA: Diagnosis not present

## 2022-01-19 LAB — ECHOCARDIOGRAM COMPLETE
AV Mean grad: 6 mmHg
AV Peak grad: 12.5 mmHg
Ao pk vel: 1.77 m/s
Area-P 1/2: 3.42 cm2
Calc EF: 67.9 %
Height: 73 in
Single Plane A2C EF: 62.3 %
Single Plane A4C EF: 71.4 %
Weight: 3795.44 oz

## 2022-01-19 LAB — HEMOGLOBIN A1C
Hgb A1c MFr Bld: 5.2 % (ref 4.8–5.6)
Mean Plasma Glucose: 102.54 mg/dL

## 2022-01-19 LAB — CBC
HCT: 30.8 % — ABNORMAL LOW (ref 36.0–46.0)
Hemoglobin: 9.4 g/dL — ABNORMAL LOW (ref 12.0–15.0)
MCH: 25.5 pg — ABNORMAL LOW (ref 26.0–34.0)
MCHC: 30.5 g/dL (ref 30.0–36.0)
MCV: 83.7 fL (ref 80.0–100.0)
Platelets: 236 10*3/uL (ref 150–400)
RBC: 3.68 MIL/uL — ABNORMAL LOW (ref 3.87–5.11)
RDW: 15.2 % (ref 11.5–15.5)
WBC: 6.4 10*3/uL (ref 4.0–10.5)
nRBC: 0 % (ref 0.0–0.2)

## 2022-01-19 LAB — BASIC METABOLIC PANEL
Anion gap: 6 (ref 5–15)
BUN: 21 mg/dL (ref 8–23)
CO2: 31 mmol/L (ref 22–32)
Calcium: 8.3 mg/dL — ABNORMAL LOW (ref 8.9–10.3)
Chloride: 101 mmol/L (ref 98–111)
Creatinine, Ser: 1.21 mg/dL — ABNORMAL HIGH (ref 0.44–1.00)
GFR, Estimated: 49 mL/min — ABNORMAL LOW (ref >60)
Glucose, Bld: 97 mg/dL (ref 70–99)
Potassium: 3.9 mmol/L (ref 3.5–5.1)
Sodium: 138 mmol/L (ref 135–145)

## 2022-01-19 LAB — LIPID PANEL
Cholesterol: 132 mg/dL (ref 0–200)
HDL: 54 mg/dL (ref >40)
LDL Cholesterol: 63 mg/dL (ref 0–99)
Total CHOL/HDL Ratio: 2.4 RATIO
Triglycerides: 77 mg/dL (ref <150)
VLDL: 15 mg/dL (ref 0–40)

## 2022-01-19 MED ORDER — DEXAMETHASONE SODIUM PHOSPHATE 10 MG/ML IJ SOLN
10.0000 mg | Freq: Once | INTRAMUSCULAR | Status: AC
  Administered 2022-01-19: 10 mg via INTRAVENOUS
  Filled 2022-01-19: qty 1

## 2022-01-19 MED ORDER — DIPHENHYDRAMINE HCL 50 MG/ML IJ SOLN
12.5000 mg | Freq: Once | INTRAMUSCULAR | Status: AC
  Administered 2022-01-19: 12.5 mg via INTRAVENOUS
  Filled 2022-01-19: qty 1

## 2022-01-19 MED ORDER — ONDANSETRON HCL 4 MG/2ML IJ SOLN
4.0000 mg | Freq: Once | INTRAMUSCULAR | Status: AC
  Administered 2022-01-19: 4 mg via INTRAVENOUS

## 2022-01-19 MED ORDER — ENOXAPARIN SODIUM 40 MG/0.4ML IJ SOSY
40.0000 mg | PREFILLED_SYRINGE | INTRAMUSCULAR | Status: DC
  Administered 2022-01-19 – 2022-01-29 (×11): 40 mg via SUBCUTANEOUS
  Filled 2022-01-19 (×11): qty 0.4

## 2022-01-19 MED ORDER — ONDANSETRON HCL 4 MG/2ML IJ SOLN
4.0000 mg | Freq: Four times a day (QID) | INTRAMUSCULAR | Status: DC | PRN
  Administered 2022-01-19 – 2022-01-29 (×19): 4 mg via INTRAVENOUS
  Filled 2022-01-19 (×19): qty 2

## 2022-01-19 MED ORDER — MAGNESIUM SULFATE IN D5W 1-5 GM/100ML-% IV SOLN
1.0000 g | Freq: Once | INTRAVENOUS | Status: AC
  Administered 2022-01-19: 1 g via INTRAVENOUS
  Filled 2022-01-19: qty 100

## 2022-01-19 MED ORDER — ACETAMINOPHEN 10 MG/ML IV SOLN
1000.0000 mg | Freq: Once | INTRAVENOUS | Status: AC
  Administered 2022-01-19: 1000 mg via INTRAVENOUS
  Filled 2022-01-19: qty 100

## 2022-01-19 MED ORDER — ONDANSETRON HCL 4 MG/2ML IJ SOLN
INTRAMUSCULAR | Status: AC
  Filled 2022-01-19: qty 2

## 2022-01-19 MED ORDER — ASPIRIN 81 MG PO CHEW
81.0000 mg | CHEWABLE_TABLET | Freq: Every day | ORAL | Status: DC
  Administered 2022-01-20 – 2022-01-30 (×11): 81 mg via ORAL
  Filled 2022-01-19 (×11): qty 1

## 2022-01-19 MED ORDER — KETOROLAC TROMETHAMINE 15 MG/ML IJ SOLN
15.0000 mg | Freq: Once | INTRAMUSCULAR | Status: AC
  Administered 2022-01-19: 15 mg via INTRAVENOUS
  Filled 2022-01-19: qty 1

## 2022-01-19 NOTE — Progress Notes (Signed)
SLP Cancellation Note  Patient Details Name: ELOUISE DIVELBISS MRN: [id number] DOB: 1951/06/06   Cancelled treatment:       Reason Eval/Treat Not Completed: Medical issues which prohibited therapy;Patient not medically ready (chart reviewed; consulted NSG; met w/ pt) Met w/ pt as PT/OT were finishing session w/ pt(~1330). Pt endorsed feelings of nausea and reported HA. She declined po trials for BSE and requested f/u in the morning.  During brief engagement, noted pt exhibited a raspy vocal quality which she stated was "new since this happened", referring need to come to the ED. NSG also reported overt s/s of aspiration w/ thin liquids during [facility] swallow screen. NSG updated re: pt's report of HA; NSG aware. Also contacted MD re: vocal quality and failed [facility] screening. Pt remains NPO w/ frequent oral care rec'd for hygiene and stimulation of swallowing.  ST services will f/u in the morning w/ BSE; order placed.       Orinda Kenner, MS, CCC-SLP Speech Language Pathologist Rehab Services; [place] [phone] (ascom) Wylder Macomber 01/19/2022, 5:04 PM

## 2022-01-19 NOTE — Evaluation (Signed)
Physical Therapy Evaluation Patient Details Name: Morgan [name] MRN: [id number] DOB: 01-Nov-1951 Today's Date: 01/19/2022  History of Present Illness  Pt is a 72 y/o F who presented to the ED with c/o syncope, L sided weakness/heaviness, & L sided facial droop. Code stroke was called & pt received TNK at 1119; pt was admitted to the ICU for further work up & monitoring. Initial MRI was negative for acute processes. PMH: CHF, HTN, mitral valve prolapse, e. 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 Pt is able to provide PLOF/home set up information with pt reporting she was independent without AD but endorses 3 falls prior to admission. On this date, pt demonstrates ability to move LUE & LLE but with significant tremors during attempts. Pt endorses pain in L calf & MD made aware. Pt assisted to sitting EOB but abruptly reports dizziness & feeling like she's going "to pass out", followed by nausea so pt assisted back to semi fowler position in bed. Pt left in bed in care of SLP. Pt would benefit from further skilled PT tx for strengthening, balance, endurance training & gait with LRAD.  Of note, pt reports she's on 3L/min at home but only on 2L/min during session with SpO2 >90%.  BP once semi fowler in bed 171/84 mmhg, MAP 110     Recommendations for follow up therapy are one component of a multi-disciplinary discharge planning process, led by the attending physician.  Recommendations may be updated based on patient status, additional functional criteria and insurance authorization.  Follow Up Recommendations Acute inpatient rehab (3hours/day)    Assistance Recommended at Discharge Intermittent Supervision/Assistance  Patient can return home with the following  A lot of help with walking and/or transfers;A lot of help with bathing/dressing/bathroom;Assist for transportation;Assistance with cooking/housework;Direct  supervision/assist for financial management;Help with stairs or ramp for entrance;Direct supervision/assist for medications management    Equipment Recommendations None recommended by PT  Recommendations for Other Services       Functional Status Assessment Patient has had a recent decline in their functional status and demonstrates the ability to make significant improvements in function in a reasonable and predictable amount of time.     Precautions / Restrictions Precautions Precautions: Fall Restrictions Weight Bearing Restrictions: No      Mobility  Bed Mobility Overal bed mobility: Needs Assistance Bed Mobility: Supine to Sit, Sit to Supine     Supine to sit: Mod assist, HOB elevated Sit to supine: Min assist, Mod assist, +2 for physical assistance, +2 for safety/equipment, HOB elevated        Transfers                        Ambulation/Gait                  Stairs            Wheelchair Mobility    Modified Rankin (Stroke Patients Only)       Balance                                             Pertinent Vitals/Pain Pain Assessment Pain Assessment: Faces Pain Score: 7  Faces Pain Scale: Hurts little more Pain Location: calf when PT attempts to dorsiflexion L ankle &  to palpation, c/o 7/10 HA Pain Descriptors / Indicators: Grimacing Pain Intervention(s): Limited activity within patient's tolerance, Repositioned    Home Living Family/patient expects to be discharged to:: Private residence Living Arrangements: Children;Other relatives Available Help at Discharge: Family;Available 24 hours/day Type of Home: House Home Access: Ramped entrance       Home Layout: One level Home Equipment: Conservation officer, nature (2 wheels);Cane - single point;Shower seat      Prior Function Prior Level of Function : Independent/Modified Independent;Driving             Mobility Comments: Independent without AD, does endorse 3  falls but calls them "white outs" because everything goes white during sit>stand.       Hand Dominance   Dominant Hand: Left    Extremity/Trunk Assessment   Upper Extremity Assessment Upper Extremity Assessment: LUE deficits/detail;Defer to OT evaluation LUE Deficits / Details: Pt with tremors when attempting to move LUE.    Lower Extremity Assessment Lower Extremity Assessment:  (Pt able to lift LLE against gravity when in supine but tremors when attempting to move LLE.)       Communication   Communication:  (decreased volume)  Cognition Arousal/Alertness: Awake/alert Behavior During Therapy: Flat affect Overall Cognitive Status: Within Functional Limits for tasks assessed                                 General Comments: Pleasant lady        General Comments      Exercises     Assessment/Plan    PT Assessment Patient needs continued PT services  PT Problem List Decreased strength;Decreased mobility;Decreased safety awareness;Decreased range of motion;Decreased coordination;Decreased knowledge of precautions;Decreased activity tolerance;Decreased cognition;Cardiopulmonary status limiting activity;Decreased knowledge of use of DME;Decreased balance;Pain       PT Treatment Interventions Therapeutic exercise;DME instruction;Gait training;Balance training;Stair training;Neuromuscular re-education;Therapeutic activities;Patient/family education;Functional mobility training;Modalities    PT Goals (Current goals can be found in the Care Plan section)  Acute Rehab PT Goals Patient Stated Goal: get better PT Goal Formulation: With patient Time For Goal Achievement: 02/02/22 Potential to Achieve Goals: Fair    Frequency 7X/week     Co-evaluation PT/OT/SLP Co-Evaluation/Treatment: Yes Reason for Co-Treatment: Complexity of the patient's impairments (multi-system involvement);For patient/therapist safety;To address functional/ADL transfers PT goals  addressed during session: Mobility/safety with mobility         AM-PAC PT "6 Clicks" Mobility  Outcome Measure Help needed turning from your back to your side while in a flat bed without using bedrails?: A Little Help needed moving from lying on your back to sitting on the side of a flat bed without using bedrails?: A Lot Help needed moving to and from a bed to a chair (including a wheelchair)?: A Lot Help needed standing up from a chair using your arms (e.g., wheelchair or bedside chair)?: A Lot Help needed to walk in hospital room?: Total Help needed climbing 3-5 steps with a railing? : Total 6 Click Score: 11    End of Session Equipment Utilized During Treatment: Oxygen Activity Tolerance:  (limited by dizziness & nausea) Patient left: in bed;with bed alarm set (in care of SLP) Nurse Communication:  (c/o HA) PT Visit Diagnosis: Difficulty in walking, not elsewhere classified (R26.2);Muscle weakness (generalized) (M62.81);Hemiplegia and hemiparesis Hemiplegia - Right/Left: Left Hemiplegia - dominant/non-dominant: Dominant Hemiplegia - caused by: Unspecified    Time: 1254-1311 PT Time Calculation (min) (ACUTE ONLY): 17 min   Charges:  PT Evaluation $PT Eval Moderate Complexity: 1 Mod          Lavone Nian, PT, DPT 01/19/22, 1:56 PM  [name] 01/19/2022, 1:50 PM

## 2022-01-19 NOTE — Progress Notes (Signed)
Shift summary: Patient neuro deficits remain unchanged throughout the shift. Patient scores for sensory loss, left eye hemiopia, left arm drift, left leg drift, ataxia in left arms. Some improvement in mentation, able to answer both questions correctly. C/O nausea this morning, treated with Zofran. BP soft but stable. Patient has very flat affect and expresses numerous stressors at this time. Report given to [place], [place] and patient care transferred.

## 2022-01-19 NOTE — Progress Notes (Signed)
S: Patient's deficits and headache are unchanged. She has a history of migraines but they have not been accompanied by weakness before.   MRI brain no e/o acute infarct (personal review) TTE: grade I disatolic dysfunction, no other sig abnl, no intrcardiac clot  O:  Today's Vitals   01/19/22 0930 01/19/22 1105 01/19/22 1200 01/19/22 1207  BP: 129/72 (!) 141/70  125/70  Pulse: 70 70    Resp: 18 16    Temp:    99 F (37.2 C)  TempSrc:    Oral  SpO2: 97% 97%    Weight:      Height:      PainSc:   6     Body mass index is 31.3 kg/m.  Physical Exam Gen: alert, oriented to self, [facility], and year but not month HEENT: Atraumatic, normocephalic;mucous membranes moist; oropharynx clear, tongue without atrophy or fasciculations. Neck: Supple, trachea midline. Resp: CTAB, no w/r/r CV: RRR, no m/g/r; nml S1 and S2. 2+ symmetric peripheral pulses. Abd: soft/NT/ND; nabs x 4 quad Extrem: Nml bulk; no cyanosis, clubbing, or edema.   Neuro: *MS: alert, oriented to self, [facility], and year but not month *Speech: fluid, nondysarthric, able to name and repeat *CN:    I: Deferred   II,III: PERRLA, unable to distinguish between 1 and 2 fingers in L lateral hemifield of L eye only, otherwise VFF, optic discs unable to be visualized 2/2 pupillary constriction   III,IV,VI: EOMI w/o nystagmus, no ptosis   V: impaired to LT L face relative to R   VII: Eyelid closure was full.  L UMN facial droop   VIII: Hearing intact to voice   IX,X: Voice normal, palate elevates symmetrically    XI: SCM/trap 5/5 bilat   XII: Tongue protrudes midline, no atrophy or fasciculations  *Motor:   Normal bulk.  No tremor, rigidity or bradykinesia. Drift in LUE and LLE but not to bed. RUE and RLE full strength. Resting and postural tremor LUE>LLE (hx Parkinson's) *Sensory: Impaired to LT LUE and LLE. No double-simultaneous extinction.  *Coordination:  FNF intact on R *Reflexes:  2+ and symmetric throughout without clonus;  toes down-going bilat *Gait: deferred   NIHSS   1a Level of Conscious.: 0 1b LOC Questions: 1 1c LOC Commands: 0 2 Best Gaze: 0 3 Visual: 1 4 Facial Palsy: 1 5a Motor Arm - left: 1 5b Motor Arm - Right: 0 6a Motor Leg - Left: 1 6b Motor Leg - Right: 0 7 Limb Ataxia: 0 8 Sensory: 1 9 Best Language: 0 10 Dysarthria: 0 11 Extinct. 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 She continues to have persistent deficits and migrainous headache despite negative MRI. 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 Her stroke workup is completed, we will work on treating her headache in hopes that that alleviates her sx. - Continue ASA 81mg  daily - Avoid triptans and ergots 2/2 vasoconstriction - Avoid compazine, phenergan, and reglan in the setting of Parkinson's disease - I ordered cocktail of 15mg  IV toradol, 12.5mg  IV benadryl, and 10mg  IV decadron - Will continue to follow  Su Monks, MD [facility] [phone]  If 7pm- 7am, please page neurology on call as listed in [place].

## 2022-01-19 NOTE — Progress Notes (Signed)
PT Cancellation Note  Patient Details Name: Morgan [name] MRN: [id number] DOB: 11-Sep-1951   Cancelled Treatment:    Reason Eval/Treat Not Completed: Patient not medically ready PT orders received, chart reviewed. Per protocol, will hold PT evaluation until 24 hrs after receiving TNK & after f/u imaging.  Lavone Nian, PT, DPT 01/19/22, 8:23 AM   [name] 01/19/2022, 8:22 AM

## 2022-01-19 NOTE — TOC Initial Note (Signed)
Transition of Care [facility]) - Initial/Assessment Note    Patient Details  Name: Morgan [name] MRN: [id number] Date of Birth: 08/19/1951  Transition of [facility]) CM/SW Contact:    Shelbie Hutching, RN Phone Number: 01/19/2022, 3:24 PM  Clinical Narrative:                 Patient admitted to the hospital with acute stroke.  RNCM met with patient at the bedside, introduced self and explained role in DC planning.  Patient reports that she is from home where she is independent.  Patient drives, she is current with PCP Dr. Einar Pheasant, and she [facility] [place].  Patient is on chronic Oxygen at 3L.    Current recommendation from PT and OT is Acute Inpatient rehab.  TOC will follow and assist with disposition.    Expected Discharge Plan: IP Rehab Facility Barriers to Discharge: Continued Medical Work up   Patient Goals and CMS Choice Patient states their goals for this hospitalization and ongoing recovery are:: to get back home and better      Expected Discharge Plan and Services Expected Discharge Plan: [place]   Discharge Planning Services: CM Consult   Living arrangements for the past 2 months: Single Family Home                 DME Arranged: N/A DME Agency: NA                  Prior Living Arrangements/Services Living arrangements for the past 2 months: Single Family Home Lives with:: Relatives, Roommate Patient language and need for interpreter reviewed:: Yes Do you feel safe going back to the place where you live?: Yes      Need for Family Participation in Patient Care: Yes (Comment) Care giver support system in place?: Yes (comment) (roommates, Aunt) Current home services: DME (oxygen) Criminal Activity/Legal Involvement Pertinent to Current Situation/Hospitalization: No - Comment as needed  Activities of Daily Living Home Assistive Devices/Equipment: Cane (specify quad or straight), Walker (specify type) ADL Screening (condition at time of  admission) Patient's cognitive ability adequate to safely complete daily activities?: Yes Is the patient deaf or have difficulty hearing?: No Does the patient have difficulty seeing, even when wearing glasses/contacts?: Yes Does the patient have difficulty concentrating, remembering, or making decisions?: Yes Patient able to express need for assistance with ADLs?: Yes Does the patient have difficulty dressing or bathing?: Yes Independently performs ADLs?: Yes (appropriate for developmental age) Does the patient have difficulty walking or climbing stairs?: Yes Weakness of Legs: Left Weakness of Arms/Hands: Left  Permission Sought/Granted Permission sought to share information with : Case Manager, Family Supports Permission granted to share information with : Yes, Verbal Permission Granted  Share Information with NAME: Tyrell Antonio     Permission granted to share info w Relationship: Aunt  Permission granted to share info w Contact Information: [phone]  Emotional Assessment Appearance:: Appears stated age Attitude/Demeanor/Rapport: Engaged Affect (typically observed): Accepting Orientation: : Oriented to Self, Oriented to Place, Oriented to  Time, Oriented to Situation Alcohol / Substance Use: Not Applicable Psych Involvement: No (comment)  Admission diagnosis:  Acute ischemic stroke ([place]) [I63.9] Left-sided weakness [R53.1] Patient Active Problem List   Diagnosis Date Noted   Acute ischemic stroke ([place]) 01/18/2022   Hypotension 10/27/2021   Tremor 10/27/2021   Microcytic anemia 09/11/2021   Stage 3a chronic kidney disease ([place]) 09/11/2021   IDA (iron deficiency anemia) 09/11/2021   Elevated TSH  08/28/2021   Osteopenia 08/28/2021   DOE (dyspnea on exertion) 08/28/2021   Acute midline thoracic back pain 06/26/2021   Other chronic pain 06/26/2021   Chest pressure 02/20/2021   Left sided abdominal pain 02/01/2021   Chronic diastolic CHF (congestive heart failure) ([place])  08/09/2020   COPD (chronic obstructive pulmonary disease) ([place]) 08/09/2020   Chronic respiratory failure with hypoxia ([place]) 08/09/2020   Polyp of sigmoid colon    Polyp of cecum    Gastritis and gastroduodenitis    Pylorus ulcer    Pyloric stenosis in adult    Chronic cholecystitis 05/19/2020   Oxygen dependent 03/30/2020   Nausea and vomiting in adult 03/22/2020   Peptic ulcer disease 03/22/2020   Iron deficiency anemia 03/22/2020   Acute pain of right knee 12/29/2019   Acute left-sided low back pain without sciatica 12/29/2019   Chronic abdominal pain 02/19/2019   Asymptomatic bacteriuria 02/19/2019   CKD (chronic kidney disease) stage 3, GFR 30-59 ml/min ([place]) 01/13/2019   CHF (congestive heart failure) ([place]) 01/13/2019   Dizziness 01/13/2019   Hoarseness 11/12/2018   Dysphagia 11/12/2018   Sleepiness 11/12/2018   CVA (cerebral vascular accident) ([place]) 08/22/2018   Left-sided weakness 10/04/2017   HLD (hyperlipidemia) 10/04/2017   GERD (gastroesophageal reflux disease) 10/04/2017   Anxiety 08/26/2017   Essential hypertension    Depression    Diarrhea 02/22/2015   Hypokalemia 02/22/2015   Chronic lower back pain 04/29/2013   Paresthesia 04/29/2013   PCP:  [name], MD [id number] [address] [id number]: [phone]: [address]Phone: [phone]: [phone]    Social Determinants of Health (SDOH) Interventions    Readmission Risk Interventions No flowsheet data found.

## 2022-01-19 NOTE — Progress Notes (Signed)
SLP Cancellation Note  Patient Details Name: Morgan [name] MRN: [id number] DOB: Oct 19, 1951   Cancelled treatment:       Reason Eval/Treat Not Completed: Medical issues which prohibited therapy;Patient not medically ready (chart reviewed; consulted NSG). NSG reported improvement last shift. Per protocol, will hold ST evaluation until 24 hrs after receiving TNK & after f/u Head CT.     Orinda Kenner, MS, CCC-SLP Speech Language Pathologist Rehab Services; [place] [phone] (ascom) Mariea Mcmartin 01/19/2022, 8:39 AM

## 2022-01-19 NOTE — Progress Notes (Signed)
°  Inpatient Rehab Admissions Coordinator :  Per therapy recommendations patient was screened for CIR candidacy by Danne Baxter RN MSN. Noted MRI negative. We await further medical workup before placing a rehab consult. I will not place a Rehab Consult. An Admissions Coordinator will follow up over the weekend to assess further.  Danne Baxter RN MSN Admissions Coordinator [phone]

## 2022-01-19 NOTE — Progress Notes (Signed)
*  PRELIMINARY RESULTS* Echocardiogram 2D Echocardiogram has been performed.  Morgan [name] Morgan [name] 01/19/2022, 9:27 AM

## 2022-01-19 NOTE — Evaluation (Signed)
Occupational Therapy Evaluation Patient Details Name: Morgan [name] MRN: [id number] DOB: [birth year] Today's Date: 01/19/2022   History of Present Illness Pt is a 71 y/o F who presented to the ED with c/o syncope, L sided weakness/heaviness, & L sided facial droop. Code stroke was called & pt received TNK at 1119; pt was admitted to the ICU for further work up & monitoring. Initial MRI was negative for acute processes. PMH: CHF, HTN, mitral valve prolapse, e. coli UTIs, PD, anemia, arthritis, COPD on 2L, CKD, BPPV   Clinical Impression   Pt seen for OT evaluation and co-tx with PT. Pt reports at baseline she is independent but has a hx of 3 falls. Pt presents with impairments in L side strength and coordination with increasing tremors noted with intentional movements, as well as L visual field deficits in B eyes. L calf painful to touch, PT made MD aware. Pt required assist to perform sup>sit EOB and endorsed significant nausea upon sitting and promptly returned to bed with HOB and emesis bag in place and reporting like she was going to "pass out." Pt reports using 3L O2/min at home. On 2L/min during session with SpO2 >90%.  BP once semi fowler in bed 171/84 mmhg, MAP 110. Pt will benefit from high intensity skilled OT services to maximize return to PLOF.   Recommendations for follow up therapy are one component of a multi-disciplinary discharge planning process, led by the attending physician.  Recommendations may be updated based on patient status, additional functional criteria and insurance authorization.   Follow Up Recommendations  Acute inpatient rehab (3hours/day)    Assistance Recommended at Discharge Intermittent Supervision/Assistance  Patient can return home with the following A lot of help with walking and/or transfers;A lot of help with bathing/dressing/bathroom;Direct supervision/assist for medications management;Direct supervision/assist for financial management;Assistance with  cooking/housework;Assist for transportation;Help with stairs or ramp for entrance    Functional Status Assessment  Patient has had a recent decline in their functional status and demonstrates the ability to make significant improvements in function in a reasonable and predictable amount of time.  Equipment Recommendations  BSC/3in1    Recommendations for Other Services       Precautions / Restrictions Precautions Precautions: Fall Restrictions Weight Bearing Restrictions: No      Mobility Bed Mobility Overal bed mobility: Needs Assistance Bed Mobility: Supine to Sit, Sit to Supine     Supine to sit: Mod assist, HOB elevated Sit to supine: Min assist, Mod assist, +2 for physical assistance, +2 for safety/equipment, HOB elevated        Transfers                   General transfer comment: unable to attempt 2/2 nausea      Balance                                           ADL either performed or assessed with clinical judgement   ADL Overall ADL's : Needs assistance/impaired                                       General ADL Comments: Pt currently requires MOD A for UB and LB ADL in sitting 2/2 L visual field deficits and L sided strength/coordination deficits, significant headache, and nausea  brought on in sitting     Vision Baseline Vision/History: 0 No visual deficits Ability to See in Adequate Light: 0 Adequate Patient Visual Report: Peripheral vision impairment Vision Assessment?: Yes Eye Alignment: Within Functional Limits Ocular Range of Motion: Within Functional Limits Alignment/Gaze Preference: Within Defined Limits Tracking/Visual Pursuits: Impaired - to be further tested in functional context;Requires cues, head turns, or add eye shifts to track;Decreased smoothness of horizontal tracking Convergence: Impaired - to be further tested in functional context Visual Fields: Left visual field deficit;Left homonymous  hemianopsia     Perception     Praxis      Pertinent Vitals/Pain Pain Assessment Pain Assessment: 0-10 Pain Score: 7  Pain Location: calf when PT attempts to dorsiflexion L ankle & to palpation, c/o 7/10 HA Pain Descriptors / Indicators: Grimacing Pain Intervention(s): Limited activity within patient's tolerance, Repositioned     Hand Dominance Left   Extremity/Trunk Assessment Upper Extremity Assessment Upper Extremity Assessment: LUE deficits/detail LUE Deficits / Details: significant tremors with intentional movement, stiff, endorses pain with composite finger extension in dorsal aspect of palm and tends to keep fingers in more flexed positioning, grossly 3/5 LUE Coordination: decreased fine motor;decreased gross motor   Lower Extremity Assessment Lower Extremity Assessment: Defer to PT evaluation;LLE deficits/detail LLE Deficits / Details: Pt able to lift LLE against gravity when in supine but tremors when attempting to move LLE       Communication Communication Communication: Other (comment) (decreased volume)   Cognition Arousal/Alertness: Awake/alert Behavior During Therapy: Flat affect Overall Cognitive Status: Within Functional Limits for tasks assessed                                       General Comments       Exercises Other Exercises Other Exercises: LUE positioned with pillow to support shoulder as well as help position hand for more neutral positioning.   Shoulder Instructions      Home Living Family/patient expects to be discharged to:: Private residence Living Arrangements: Children;Other relatives Available Help at Discharge: Family;Available 24 hours/day Type of Home: House Home Access: Ramped entrance     Home Layout: One level     Bathroom Shower/Tub: Walk-in shower;Tub/shower unit         Home Equipment: Conservation officer, nature (2 wheels);Cane - single point;Shower seat          Prior Functioning/Environment Prior Level  of Function : Independent/Modified Independent;Driving             Mobility Comments: Independent without AD, does endorse 3 falls but calls them "white outs" because everything goes white during sit>stand. ADLs Comments: Independent.        OT Problem List: Decreased strength;Decreased coordination;Pain;Decreased range of motion;Impaired balance (sitting and/or standing);Decreased knowledge of use of DME or AE;Impaired UE functional use;Impaired vision/perception      OT Treatment/Interventions: Self-care/ADL training;Therapeutic exercise;Therapeutic activities;Neuromuscular education;DME and/or AE instruction;Visual/perceptual remediation/compensation;Patient/family education;Balance training    OT Goals(Current goals can be found in the care plan section) Acute Rehab OT Goals Patient Stated Goal: be independent OT Goal Formulation: With patient Time For Goal Achievement: 02/02/22 Potential to Achieve Goals: Good ADL Goals Pt Will Perform Grooming: with set-up;with supervision;sitting (sitting unsupported EOB) Pt Will Transfer to Toilet: with min assist;bedside commode;ambulating (LRAD) Additional ADL Goal #1: Pt will incorporate learned hemi techniques for visual deficits to perform table top/countertop activities with PRN VC to utilize, 3/3 opportunities.  OT Frequency: Min 3X/week    Co-evaluation PT/OT/SLP Co-Evaluation/Treatment: Yes Reason for Co-Treatment: Complexity of the patient's impairments (multi-system involvement);For patient/therapist safety;To address functional/ADL transfers PT goals addressed during session: Mobility/safety with mobility OT goals addressed during session: ADL's and self-care      AM-PAC OT "6 Clicks" Daily Activity     Outcome Measure Help from another person eating meals?: A Little Help from another person taking care of personal grooming?: A Little Help from another person toileting, which includes using toliet, bedpan, or urinal?: A  Lot Help from another person bathing (including washing, rinsing, drying)?: A Lot Help from another person to put on and taking off regular upper body clothing?: A Lot Help from another person to put on and taking off regular lower body clothing?: A Lot 6 Click Score: 14   End of Session Nurse Communication: Mobility status;Patient requests pain meds  Activity Tolerance: Treatment limited secondary to medical complications (Comment);Patient limited by pain (N/V with sitting, 7/10 headache) Patient left: in bed;with call bell/phone within reach;Other (comment) (SLP)  OT Visit Diagnosis: Other abnormalities of gait and mobility (R26.89);Hemiplegia and hemiparesis Hemiplegia - Right/Left: Left Hemiplegia - dominant/non-dominant: Dominant Hemiplegia - caused by: Unspecified                Time: 1257-1311 OT Time Calculation (min): 14 min Charges:  OT General Charges $OT Visit: 1 Visit OT Evaluation $OT Eval Moderate Complexity: 1 Mod  Ardeth Perfect., MPH, MS, OTR/L ascom [phone] 01/19/22, 3:05 PM

## 2022-01-19 NOTE — Plan of Care (Signed)
°  Problem: Education: Goal: Knowledge of General Education information will improve Description: Including pain rating scale, medication(s)/side effects and non-pharmacologic comfort measures Outcome: Progressing   Problem: Health Behavior/Discharge Planning: Goal: Ability to manage health-related needs will improve Outcome: Progressing   Problem: Clinical Measurements: Goal: Ability to maintain clinical measurements within normal limits will improve Outcome: Progressing Goal: Will remain free from infection Outcome: Progressing Goal: Diagnostic test results will improve Outcome: Progressing Goal: Cardiovascular complication will be avoided Outcome: Progressing   Problem: Activity: Goal: Risk for activity intolerance will decrease Outcome: Progressing   Problem: Nutrition: Goal: Adequate nutrition will be maintained Outcome: Progressing   Problem: Coping: Goal: Level of anxiety will decrease Outcome: Progressing   Problem: Elimination: Goal: Will not experience complications related to bowel motility Outcome: Progressing Goal: Will not experience complications related to urinary retention Outcome: Progressing   Problem: Pain Managment: Goal: General experience of comfort will improve Outcome: Progressing   Problem: Safety: Goal: Ability to remain free from injury will improve Outcome: Progressing   Problem: Skin Integrity: Goal: Risk for impaired skin integrity will decrease Outcome: Progressing   Problem: Education: Goal: Knowledge of disease or condition will improve Outcome: Progressing Goal: Knowledge of secondary prevention will improve (SELECT ALL) Outcome: Progressing Goal: Knowledge of patient specific risk factors will improve (INDIVIDUALIZE FOR PATIENT) Outcome: Progressing Goal: Individualized Educational Video(s) Outcome: Progressing   Problem: Education: Goal: Knowledge of disease or condition will improve Outcome: Progressing Goal: Knowledge  of secondary prevention will improve (SELECT ALL) Outcome: Progressing Goal: Knowledge of patient specific risk factors will improve (INDIVIDUALIZE FOR PATIENT) Outcome: Progressing   Problem: Coping: Goal: Will identify appropriate support needs Outcome: Progressing   Problem: Health Behavior/Discharge Planning: Goal: Ability to manage health-related needs will improve Outcome: Progressing   Problem: Self-Care: Goal: Ability to participate in self-care as condition permits will improve Outcome: Progressing Goal: Ability to communicate needs accurately will improve Outcome: Progressing   Problem: Nutrition: Goal: Risk of aspiration will decrease Outcome: Progressing   Problem: Ischemic Stroke/TIA Tissue Perfusion: Goal: Complications of ischemic stroke/TIA will be minimized Outcome: Progressing

## 2022-01-19 NOTE — Progress Notes (Signed)
NAME:  Morgan [name], MRN:  [id number], DOB:  January 05, 1951, LOS: 1 ADMISSION DATE:  01/18/2022, CONSULTATION DATE:  01/18/22 REFERRING MD:  Dr. Jacqualine Code, CHIEF COMPLAINT:  Syncope, Dizziness, Numbness  Brief Pt Description / Synopsis:  71 y.o. female who presented with acute syncope, left sided weakness/heaviness and facial droop.  CODE STROKE called, status post TNK.  History of Present Illness:  Morgan [name] is a 70 year old female with a past medical history significant for CHF, hypertension, mitral valve prolapse, E. coli UTIs, Parkinson's disease, anemia, and arthritis who presented to [facility] ED on 01/18/2022 due to complaints of syncope, left-sided weakness/heaviness, left-sided facial droop.  Per patient's report, she was at her doctors office earlier this morning getting a renal ultrasound.  When she stood up she became dizzy with syncopal episode (denies hitting head or any trauma).  When she came to, she exhibited left-sided weakness/heaviness, numbness, facial droop, and visual field cuts.  Prior to today's incident, the patient denies any previous symptoms.  ED Course: Initial vital signs: Temperature 97.6 F orally, respiratory rate 16, pulse 71, blood pressure 148/81, SPO2 99% on room air Significant labs: Sodium 133, glucose 106, BUN 24, creatinine 1.41, calcium 8.0, albumin 3.2, WBC 10.9 with neutrophilia, hemoglobin 9.2, hematocrit 30, INR 1.0, PT 13.0, APTT 28, ethyl alcohol less than 10 COVID-19 PCR is negative Urinalysis with small leukocytes and many bacteria Urine drug screen negative Imaging: CT head without contrast>>IMPRESSION: No evidence of acute large vascular territory infarct or acute hemorrhage. ASPECTS is 10. CTA head neck>> IMPRESSION: No large vessel occlusion. Plaque at the right ICA origin causes approximately 60% stenosis. Plaque at the left ICA origin causes approximately 50% stenosis. Medications given: TNKase @ 11:19  Given patient's symptoms, code stroke was  called.  She was evaluated weighted by neurology, and was deemed a candidate for TNKase.  Patient was in agreement, therefore TNKase administered at 1119.  PCCM is asked admit the patient to ICU for further work-up and treatment and close monitoring.  Pertinent  Medical History  Congestive heart failure Hypertension Mitral valve prolapse E. Coli UTI's Parkinson's disease Anemia Arthritis  Micro Data:  2/2: SARS-CoV-2 and influenza PCR>>negative 2/2: Urine>> 2/2: MRSA PCR>>negative  Antimicrobials:    Significant Hospital Events: Including procedures, antibiotic start and stop dates in addition to other pertinent events   2/2: Presented to ED with stroke symptoms, received TNKase.  Neurology following, PCCM asked to admit 2/3: Neuro deficits persist.  24 hr CT post TNK pending. Leukocytosis resolved. Renal function improving  Interim History / Subjective:  -No significant events reported overnight -Afebrile, hemodynamically stable, on room air -BP well controlled -Neuro deficits persist (left sided weakness/heaviness, numbness, facial droop virtually unchanged to slightly better) -24 hour CT post TNK scheduled for approximately 12:00 -Creatinine improved to 1.21 (1.45), UOP 1.5L last 24 hrs (net - 850 cc) -Leukocytosis resolved -Denies any history of or current dysuria or frequency, Urine culture pending -Complaints of gas pain, still tender to palpation (improved somewhat from yesterday)  Objective   Blood pressure 121/70, pulse 64, temperature (!) 97.5 F (36.4 C), temperature source Oral, resp. rate 13, height 6\' 1"  (1.854 m), weight 107.6 kg, SpO2 100 %.        Intake/Output Summary (Last 24 hours) at 01/19/2022 0730 Last data filed at 01/19/2022 0700 Gross per 24 hour  Intake 701.75 ml  Output 1550 ml  Net -848.25 ml   Filed Weights   01/18/22 1054 01/18/22 1100 01/18/22 1700  Weight: 107.5  kg 107.5 kg 107.6 kg    Examination: General: Acutely ill-appearing  female, laying in bed, on room air, no acute distress HENT: Atraumatic, normocephalic, neck supple, no JVD Lungs: Clear breath sounds bilaterally, even, nonlabored, currently protecting airway Cardiovascular: Regular rate and rhythm, S1-S2, no murmurs, rubs, gallops Abdomen: Soft, tender to palpation throughout, no guarding or rebound tenderness, BS+ x4 Extremities: Persistent Left-sided weakness/heaviness/numbness/facial droop, no deformities Neuro: Awake, alert and oriented x4, follows commands with noted left-sided weakness, pupils PERRLA GU: External catheter in place  Resolved Hospital Problem list     Assessment & Plan:   Acute Ischemic Stroke s/p TNK PMHx: Hypertension, Mitral valve Prolapse -ICU monitoring -Frequent neurochecks and NIHSS scores per post-TNK protocol -Neurology following, appreciate input -Obtain stat head CT for any change in neurological exam -MRI brain negative -CTA Head & Neck negative for large vessel occlusion -Repeat noncontrast head CT 24 hours post TNKase to rule out hemorrhagic conversion is pending (scheduled for 12:00 today) -No antiplatelets or anticoagulation for 24 hours post TNKase, until ICC ruled out on repeat imaging at 24 hours -Maintain SBP <180 and/or DBP <105 -As needed labetalol and nicardipine drip if needed to maintain blood pressure goal -SCDs for DVT prophylaxis -N.p.o., bedside swallow study pending -PT/OT/speech therapy consult -hemoglobin A1c is 5.2 -fasting lipid panel (Cholesterol, HDL 54, LDL 63, Triglycerides 77) -Will need Statin therapy once able to tolerate PO  Mild Leukocytosis ~ RESOLVED Acute Abdominal Pain ~ IMPROVING PMHx: E. 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 No hydronephrosis or renal calculi observed. Renal echogenicity appears normal currently.  Anemia of chronic disease -Monitor for S/Sx of bleeding -Trend CBC -SCD's for VTE Prophylaxis  -Transfuse for Hgb <7   Best Practice (right click and "Reselect all SmartList Selections" daily)   Diet/type: NPO, SPEECH EVAL PENDING DVT prophylaxis: SCD GI prophylaxis: PPI Lines: N/A Foley:  N/A Code Status:  full code Last date of multidisciplinary goals of care discussion [01/19/22]  Pt updated at bedside 01/19/22.  All questions answered.  Labs   CBC: Recent Labs  Lab 01/18/22 1137 01/19/22 0443  WBC 10.9* 6.4  NEUTROABS 8.9*  --   HGB 9.2* 9.4*  HCT 30.0* 30.8*  MCV 86.5 83.7  PLT 226 236     Basic Metabolic Panel: Recent Labs  Lab 01/18/22 1137 01/19/22 0443  NA 133* 138  K 4.2 3.9  CL 99 101  CO2 27 31  GLUCOSE 106* 97  BUN 24* 21  CREATININE 1.41* 1.21*  CALCIUM 8.0* 8.3*    GFR: Estimated Creatinine Clearance: 62 mL/min (A) (by C-G formula based on SCr of 1.21 mg/dL (H)). Recent Labs  Lab 01/18/22 1137 01/19/22 0443  PROCALCITON <0.10  --   WBC 10.9* 6.4     Liver Function Tests: Recent Labs  Lab 01/18/22 1137  AST 14*  ALT 6  ALKPHOS 108  BILITOT 0.3  PROT 6.1*  ALBUMIN 3.2*    No results for input(s): LIPASE, AMYLASE in the last 168 hours. No  results for input(s): AMMONIA in the last 168 hours.  ABG    Component Value Date/Time   PHART 7.49 (H) 08/29/2017 1650   PCO2ART 56 (H) 08/29/2017 1650   PO2ART 114 (H) 08/29/2017 1650   HCO3 42.7 (H) 08/29/2017 1650   TCO2 30 05/15/2016 1550   O2SAT 98.8 08/29/2017 1650      Coagulation  Profile: Recent Labs  Lab 01/18/22 1137  INR 1.0     Cardiac Enzymes: No results for input(s): CKTOTAL, CKMB, CKMBINDEX, TROPONINI in the last 168 hours.  HbA1C: Hgb A1c MFr Bld  Date/Time Value Ref Range Status  10/05/2017 03:50 AM 5.8 (H) 4.8 - 5.6 % Final    Comment:    (NOTE) Pre diabetes:          5.7%-6.4% Diabetes:              >6.4% Glycemic control for   <7.0% adults with diabetes     CBG: Recent Labs  Lab 01/18/22 1052 01/18/22 1701 01/18/22 2110  GLUCAP 98 76 85     Review of Systems:   Positives in BOLD: Gen: Denies fever, chills, weight change, fatigue, night sweats HEENT: Denies blurred vision, double vision, hearing loss, tinnitus, sinus congestion, rhinorrhea, sore throat, neck stiffness, dysphagia PULM: Denies shortness of breath, cough, sputum production, hemoptysis, wheezing CV: Denies chest pain, edema, orthopnea, paroxysmal nocturnal dyspnea, palpitations GI: Denies abdominal pain, nausea, vomiting, diarrhea, hematochezia, melena, constipation, change in bowel habits GU: Denies dysuria, hematuria, polyuria, oliguria, urethral discharge Endocrine: Denies hot or cold intolerance, polyuria, polyphagia or appetite change Derm: Denies rash, dry skin, scaling or peeling skin change Heme: Denies easy bruising, bleeding, bleeding gums Neuro: Denies headache, numbness, weakness, slurred speech, loss of memory or consciousness   Past Medical History:  She,  has a past medical history of Allergy, Anemia, Anxiety, Arthritis, CHF (congestive heart failure) (HCC), Depression, GERD (gastroesophageal reflux disease), Hypertension, MVP (mitral valve prolapse), and Parkinson disease ([place]).   Surgical History:   Past Surgical History:  Procedure Laterality Date   ABDOMINAL HYSTERECTOMY     APPENDECTOMY     BALLOON DILATION N/A 04/21/2020   Procedure: BALLOON DILATION;  Surgeon: Milus Banister, MD;  Location: [facility];  Service: Endoscopy;  Laterality:  N/A;  pyloric/ GI   BALLOON DILATION N/A 07/04/2020   Procedure: BALLOON DILATION;  Surgeon: Mauri Pole, MD;  Location: [facility];  Service: Endoscopy;  Laterality: N/A;   BIOPSY  04/21/2020   Procedure: BIOPSY;  Surgeon: Milus Banister, MD;  Location: [facility];  Service: Endoscopy;;   BIOPSY  07/04/2020   Procedure: BIOPSY;  Surgeon: Mauri Pole, MD;  Location: [facility];  Service: Endoscopy;;  EGD and COLON   BREAST SURGERY Right 1988   tumor removal   CHOLECYSTECTOMY N/A 05/18/2020   Procedure: LAPAROSCOPIC CHOLECYSTECTOMY;  Surgeon: Johnathan Hausen, MD;  Location: [facility];  Service: General;  Laterality: N/A;   COLONOSCOPY WITH PROPOFOL N/A 07/04/2020   Procedure: COLONOSCOPY WITH PROPOFOL;  Surgeon: Mauri Pole, MD;  Location: [facility];  Service: Endoscopy;  Laterality: N/A;   COLONOSCOPY WITH PROPOFOL N/A 11/03/2021   Procedure: COLONOSCOPY WITH PROPOFOL;  Surgeon: Lesly Rubenstein, MD;  Location: [facility];  Service: Endoscopy;  Laterality: N/A;   ESOPHAGEAL DILATION  08/11/2020   Procedure: PYLORIC DILATION;  Surgeon: Doran Stabler, MD;  Location: [facility];  Service: Gastroenterology;;   ESOPHAGOGASTRODUODENOSCOPY (EGD) WITH PROPOFOL N/A 04/21/2020   Procedure: ESOPHAGOGASTRODUODENOSCOPY (EGD) WITH PROPOFOL;  Surgeon: Milus Banister, MD;  Location: [facility];  Service: Endoscopy;  Laterality: N/A;   ESOPHAGOGASTRODUODENOSCOPY (EGD) WITH PROPOFOL N/A 07/04/2020   Procedure: ESOPHAGOGASTRODUODENOSCOPY (EGD) WITH PROPOFOL;  Surgeon: Mauri Pole, MD;  Location: [facility];  Service: Endoscopy;  Laterality: N/A;   ESOPHAGOGASTRODUODENOSCOPY (EGD) WITH PROPOFOL N/A 08/11/2020   Procedure: ESOPHAGOGASTRODUODENOSCOPY (EGD) WITH PROPOFOL;  Surgeon: Doran Stabler, MD;  Location: [facility];  Service: Gastroenterology;  Laterality: N/A;   ESOPHAGOGASTRODUODENOSCOPY (EGD) WITH PROPOFOL N/A 11/03/2021   Procedure:  ESOPHAGOGASTRODUODENOSCOPY (EGD) WITH PROPOFOL;  Surgeon: Lesly Rubenstein, MD;  Location: [facility];  Service: Endoscopy;  Laterality: N/A;   KNEE SURGERY  2005   2 times left   KNEE SURGERY Right    KYPHOPLASTY N/A 07/20/2021   Procedure: T 10KYPHOPLASTY;  Surgeon: Hessie Knows, MD;  Location: [facility];  Service: Orthopedics;  Laterality: N/A;   KYPHOPLASTY N/A 08/23/2021   Procedure: T9 KYPHOPLASTY;  Surgeon: Hessie Knows, MD;  Location: [facility];  Service: Orthopedics;  Laterality: N/A;   POLYPECTOMY  07/04/2020   Procedure: POLYPECTOMY;  Surgeon: Mauri Pole, MD;  Location: [facility];  Service: Endoscopy;;   TUBAL LIGATION       Social History:   reports that she quit smoking about 4 years ago. Her smoking use included cigarettes. She has a 15.00 pack-year smoking history. She has never used smokeless tobacco. She reports that she does not currently use alcohol. She reports that she does not use drugs.   Family History:  Her family history includes Brain cancer in her mother; Breast cancer (age of onset: 28) in her mother; Breast cancer (age of onset: 35) in her maternal aunt and maternal aunt; Breast cancer (age of onset: 52) in her paternal grandmother; Breast cancer (age of onset: 40) in her maternal grandmother; Colon cancer in her father, maternal grandfather, and maternal uncle; Diabetes in her paternal grandmother; Heart disease in her father; Hyperlipidemia in her father; Hypertension in her father; Liver disease in her maternal grandfather; Other in her son; Ovarian cancer (age of onset: 58) in her mother. There is no history of Stomach cancer, Pancreatic cancer, or Esophageal cancer.   Allergies Allergies  Allergen Reactions   Meperidine Hives and Anaphylaxis   Strawberry Extract Hives and Swelling   Sucralfate Nausea Only and Other (See Comments)    Severe nausea Other reaction(s): Other (See Comments) Severe nausea Severe nausea   Wasp Venom Hives      Home Medications  Prior to Admission medications   Medication Sig Start Date End Date Taking? Authorizing Provider  albuterol (VENTOLIN HFA) 108 (90 Base) MCG/ACT inhaler Inhale 1-2 puffs into the lungs every 6 (six) hours as needed for wheezing or shortness of breath. 10/31/20   Tasia Catchings, Amy V, PA-C  alendronate (FOSAMAX) 70 MG tablet Take 1 tablet (70 mg total) by mouth every 7 (seven) days. Take with a full glass of water on an empty stomach. 08/28/21   [name], MD  aspirin-acetaminophen-caffeine (EXCEDRIN MIGRAINE) [phone] MG tablet Take 1-2 tablets by mouth every 6 (six) hours as needed for headache.    [provider]  atorvastatin (LIPITOR) 40 MG tablet Take 1 tablet (40 mg total) by mouth daily. 09/27/21   [name], MD  carbidopa-levodopa (SINEMET IR) 25-100 MG tablet Take 1 tablet by mouth 3 (three) times daily. 11/21/21   [provider]  carvedilol (COREG) 12.5 MG tablet Take 12.5 mg by mouth 2 (two) times daily. 10/25/21  [provider]  cetirizine (ZYRTEC ALLERGY) 10 MG tablet Take 1 tablet (10 mg total) by mouth daily. 09/12/21   Jaynee Eagles, PA-C  FLUoxetine (PROZAC) 40 MG capsule Take 40 mg by mouth daily.    [provider]  fluticasone-salmeterol (ADVAIR HFA) 115-21 MCG/ACT inhaler Inhale 2 puffs into the lungs 2 (two) times daily. 10/17/21   [name], MD  furosemide (LASIX) 40 MG tablet Take 1 tablet (40 mg total) by mouth daily. 10/07/21   Sidney Ace, MD  gabapentin (NEURONTIN) 400 MG capsule Take 400 mg by mouth 3 (three) times daily as needed (for nerve pain). 10/20/18   [provider]  hydrOXYzine (ATARAX/VISTARIL) 25 MG tablet Take 25 mg by mouth every 8 (eight) hours as needed for anxiety. 08/22/21   [provider]  ibuprofen (ADVIL) 600 MG tablet Take 1 tablet (600 mg total) by mouth every 6 (six) hours as needed. 06/12/21   Melynda Ripple, MD  KLOR-CON M20 20 MEQ tablet TAKE 1 TABLET BY  MOUTH EVERY DAY 12/22/21   [name], MD  linaclotide [facility]) 145 MCG CAPS capsule Take 145 mcg by mouth daily before breakfast. Patient not taking: Reported on 11/03/2021    [provider]  meloxicam (MOBIC) 7.5 MG tablet Take 7.5 mg by mouth daily. 09/18/21   [provider]  methocarbamol (ROBAXIN) 500 MG tablet Take 1 tablet (500 mg total) by mouth 2 (two) times daily as needed for muscle spasms. 12/30/21   Teodora Medici, FNP  mirtazapine (REMERON) 15 MG tablet Take 15 mg by mouth at bedtime. 10/20/20   [provider]  nitrofurantoin, macrocrystal-monohydrate, (MACROBID) 100 MG capsule Take 1 capsule (100 mg total) by mouth 2 (two) times daily. 10/29/21   Francene Finders, PA-C  omeprazole (PRILOSEC) 40 MG capsule TAKE 1 CAPSULE BY MOUTH EVERY DAY Patient taking differently: Take 80 mg by mouth daily. 08/14/21   [name], MD  ondansetron (ZOFRAN-ODT) 4 MG disintegrating tablet Take 1-2 tablets by mouth every 6 hours as needed 03/03/21   Levin Erp, PA  OXYGEN Inhale 2 L/min into the lungs See admin instructions. 2 L/min at bedtime and as needed for shortness of breath throughout the day    [provider]  polyethylene glycol-electrolytes (NULYTELY) 420 g solution Take 4,000 mLs by mouth as directed. 09/21/21   [provider]  predniSONE (DELTASONE) 20 MG tablet Take 2 tablets daily with breakfast. 09/20/21   Jaynee Eagles, PA-C  valsartan (DIOVAN) 320 MG tablet Take 320 mg by mouth daily. 05/11/21   [provider]     Critical care time: 40 minutes     Darel Hong, AGACNP-BC [facility] [place] epic messenger for cross cover needs If after hours, please call E-link

## 2022-01-20 ENCOUNTER — Inpatient Hospital Stay: Payer: Medicare HMO

## 2022-01-20 DIAGNOSIS — G43009 Migraine without aura, not intractable, without status migrainosus: Secondary | ICD-10-CM | POA: Diagnosis not present

## 2022-01-20 DIAGNOSIS — I639 Cerebral infarction, unspecified: Secondary | ICD-10-CM | POA: Diagnosis not present

## 2022-01-20 DIAGNOSIS — R531 Weakness: Secondary | ICD-10-CM | POA: Diagnosis not present

## 2022-01-20 DIAGNOSIS — H5340 Unspecified visual field defects: Secondary | ICD-10-CM | POA: Diagnosis not present

## 2022-01-20 LAB — PHOSPHORUS: Phosphorus: 4.1 mg/dL (ref 2.5–4.6)

## 2022-01-20 LAB — CBC
HCT: 32.1 % — ABNORMAL LOW (ref 36.0–46.0)
Hemoglobin: 10.3 g/dL — ABNORMAL LOW (ref 12.0–15.0)
MCH: 26.8 pg (ref 26.0–34.0)
MCHC: 32.1 g/dL (ref 30.0–36.0)
MCV: 83.4 fL (ref 80.0–100.0)
Platelets: 269 10*3/uL (ref 150–400)
RBC: 3.85 MIL/uL — ABNORMAL LOW (ref 3.87–5.11)
RDW: 14.5 % (ref 11.5–15.5)
WBC: 5.3 10*3/uL (ref 4.0–10.5)
nRBC: 0 % (ref 0.0–0.2)

## 2022-01-20 LAB — BASIC METABOLIC PANEL
Anion gap: 10 (ref 5–15)
BUN: 29 mg/dL — ABNORMAL HIGH (ref 8–23)
CO2: 28 mmol/L (ref 22–32)
Calcium: 8.9 mg/dL (ref 8.9–10.3)
Chloride: 102 mmol/L (ref 98–111)
Creatinine, Ser: 1.23 mg/dL — ABNORMAL HIGH (ref 0.44–1.00)
GFR, Estimated: 48 mL/min — ABNORMAL LOW (ref >60)
Glucose, Bld: 121 mg/dL — ABNORMAL HIGH (ref 70–99)
Potassium: 4 mmol/L (ref 3.5–5.1)
Sodium: 140 mmol/L (ref 135–145)

## 2022-01-20 LAB — MAGNESIUM: Magnesium: 2.1 mg/dL (ref 1.7–2.4)

## 2022-01-20 MED ORDER — DIPHENHYDRAMINE HCL 50 MG/ML IJ SOLN
12.5000 mg | Freq: Once | INTRAMUSCULAR | Status: AC
  Administered 2022-01-20: 01:00:00 12.5 mg via INTRAVENOUS
  Filled 2022-01-20: qty 1

## 2022-01-20 MED ORDER — VALPROATE SODIUM 100 MG/ML IV SOLN
1000.0000 mg | Freq: Once | INTRAVENOUS | Status: AC
  Administered 2022-01-20: 1000 mg via INTRAVENOUS
  Filled 2022-01-20: qty 10

## 2022-01-20 MED ORDER — GADOBUTROL 1 MMOL/ML IV SOLN
10.0000 mL | Freq: Once | INTRAVENOUS | Status: AC | PRN
  Administered 2022-01-20: 22:00:00 10 mL via INTRAVENOUS

## 2022-01-20 MED ORDER — HYDROCODONE-ACETAMINOPHEN 5-325 MG PO TABS
1.0000 | ORAL_TABLET | Freq: Once | ORAL | Status: AC
  Administered 2022-01-20: 07:00:00 1 via ORAL
  Filled 2022-01-20: qty 1

## 2022-01-20 MED ORDER — KETOROLAC TROMETHAMINE 15 MG/ML IJ SOLN
15.0000 mg | Freq: Once | INTRAMUSCULAR | Status: AC
  Administered 2022-01-20: 01:00:00 15 mg via INTRAVENOUS
  Filled 2022-01-20: qty 1

## 2022-01-20 NOTE — Evaluation (Addendum)
Clinical/Bedside Swallow Evaluation Patient Details  Name: Morgan [name] MRN: [id number] Date of Birth: 05-10-1951  Today's Date: 01/20/2022 Time: SLP Start Time (ACUTE ONLY): 4 SLP Stop Time (ACUTE ONLY): 32 SLP Time Calculation (min) (ACUTE ONLY): 50 min  Past Medical History:  Past Medical History:  Diagnosis Date   Allergy    Anemia    Anxiety    Arthritis    CHF (congestive heart failure) (HCC)    Depression    GERD (gastroesophageal reflux disease)    Hypertension    MVP (mitral valve prolapse)    Parkinson disease ([place])    Past Surgical History:  Past Surgical History:  Procedure Laterality Date   ABDOMINAL HYSTERECTOMY     APPENDECTOMY     BALLOON DILATION N/A 04/21/2020   Procedure: BALLOON DILATION;  Surgeon: Milus Banister, MD;  Location: [facility];  Service: Endoscopy;  Laterality: N/A;  pyloric/ GI   BALLOON DILATION N/A 07/04/2020   Procedure: BALLOON DILATION;  Surgeon: Mauri Pole, MD;  Location: [facility];  Service: Endoscopy;  Laterality: N/A;   BIOPSY  04/21/2020   Procedure: BIOPSY;  Surgeon: Milus Banister, MD;  Location: [facility];  Service: Endoscopy;;   BIOPSY  07/04/2020   Procedure: BIOPSY;  Surgeon: Mauri Pole, MD;  Location: [facility];  Service: Endoscopy;;  EGD and COLON   BREAST SURGERY Right 1988   tumor removal   CHOLECYSTECTOMY N/A 05/18/2020   Procedure: LAPAROSCOPIC CHOLECYSTECTOMY;  Surgeon: Johnathan Hausen, MD;  Location: [facility];  Service: General;  Laterality: N/A;   COLONOSCOPY WITH PROPOFOL N/A 07/04/2020   Procedure: COLONOSCOPY WITH PROPOFOL;  Surgeon: Mauri Pole, MD;  Location: [facility];  Service: Endoscopy;  Laterality: N/A;   COLONOSCOPY WITH PROPOFOL N/A 11/03/2021   Procedure: COLONOSCOPY WITH PROPOFOL;  Surgeon: Lesly Rubenstein, MD;  Location: [facility];  Service: Endoscopy;  Laterality: N/A;   ESOPHAGEAL DILATION  08/11/2020   Procedure: PYLORIC DILATION;  Surgeon: Doran Stabler, MD;  Location: [facility];  Service: Gastroenterology;;   ESOPHAGOGASTRODUODENOSCOPY (EGD) WITH PROPOFOL N/A 04/21/2020   Procedure: ESOPHAGOGASTRODUODENOSCOPY (EGD) WITH PROPOFOL;  Surgeon: Milus Banister, MD;  Location: [facility];  Service: Endoscopy;  Laterality: N/A;   ESOPHAGOGASTRODUODENOSCOPY (EGD) WITH PROPOFOL N/A 07/04/2020   Procedure: ESOPHAGOGASTRODUODENOSCOPY (EGD) WITH PROPOFOL;  Surgeon: Mauri Pole, MD;  Location: [facility];  Service: Endoscopy;  Laterality: N/A;   ESOPHAGOGASTRODUODENOSCOPY (EGD) WITH PROPOFOL N/A 08/11/2020   Procedure: ESOPHAGOGASTRODUODENOSCOPY (EGD) WITH PROPOFOL;  Surgeon: Doran Stabler, MD;  Location: [facility];  Service: Gastroenterology;  Laterality: N/A;   ESOPHAGOGASTRODUODENOSCOPY (EGD) WITH PROPOFOL N/A 11/03/2021   Procedure: ESOPHAGOGASTRODUODENOSCOPY (EGD) WITH PROPOFOL;  Surgeon: Lesly Rubenstein, MD;  Location: [facility];  Service: Endoscopy;  Laterality: N/A;   KNEE SURGERY  2005   2 times left   KNEE SURGERY Right    KYPHOPLASTY N/A 07/20/2021   Procedure: T 10KYPHOPLASTY;  Surgeon: Hessie Knows, MD;  Location: [facility];  Service: Orthopedics;  Laterality: N/A;   KYPHOPLASTY N/A 08/23/2021   Procedure: T9 KYPHOPLASTY;  Surgeon: Hessie Knows, MD;  Location: [facility];  Service: Orthopedics;  Laterality: N/A;   POLYPECTOMY  07/04/2020   Procedure: POLYPECTOMY;  Surgeon: Mauri Pole, MD;  Location: [facility];  Service: Endoscopy;;   TUBAL LIGATION     HPI:  Pt is a 71 year old female with a past medical history significant for Multiple Medical issues including Esophageal strictures w/ Dilations per pt report(most recent ~  6 months ago), Migraines, CHF, hypertension, mitral valve prolapse, E. coli UTIs, Parkinson's disease, anemia, and arthritis who presented to [facility] ED on 01/18/2022 due to complaints of syncope, left-sided weakness/heaviness, left-sided facial droop.  Per patient's report, she was at her  doctors office earlier this morning getting a renal ultrasound.  When she stood up she became dizzy with syncopal episode (denies hitting head or any trauma).  When she came to, she exhibited left-sided weakness/heaviness, numbness, facial droop, and visual field cuts. She endorses a gravely/raspy vocal quality at home intermittently(saw dx of Hoarseness in 2019 in chart).  MRI: No acute intracranial pathology.    Assessment / Plan / Recommendation  Clinical Impression  Pt appears to present w/ adequate oropharyngeal phase swallow function at this evaluation w/ No oropharyngeal phase dysphagia noted, No neuromuscular deficits noted. Pt consumed po trials w/ No overt, clinical s/s of aspiration during po trials. Pt appears at reduced risk for aspiration when following general aspiration precautions during oral intake; her risk factors for impact of oropharyngeal swallowing include: Parkinson's Dis on Sinemet 3x daily, per chart; REFLUX/Esophageal Dysmotility w/ recent Dilation per pt, and current illness/hospitalization. Today, she stated she does not have N/V feelings as she did yesterday PM; less intense HA.      During po trials, pt consumed all consistencies w/ no overt coughing, decline in vocal quality, or change in respiratory presentation during/post trials. Oral phase appeared [facility] w/ timely bolus management, mastication, and control of bolus propulsion for A-P transfer for swallowing. Oral clearing achieved w/ all trial consistencies. Dentures were in place. OM Exam appeared [facility] w/ no unilateral weakness noted. Speech Clear though vocal quality appeared min raspy w/ fair volume. Pt fed self w/ setup support and extra supportive positioning in bed.   Recommend a Regular consistency diet w/ well-Cut meats, moistened foods; Thin liquids VIA CUP - less straw use d/t air swallowing. Recommend general aspiration precautions, REFLUX precautions; Pills WHOLE in Puree for safer, easier swallowing as needed.  Education given on Pills in Puree; food consistencies and easy to eat options; general aspiration and REFLUX precautions w/ handouts discussed/given -- recommend f/u w/ GI for ongoing management of Esophageal dysmotility. NSG to reconsult if any new needs arise while admitted. NSG/MD updated, agreed.     OF NOTE: She endorses a gravely/raspy vocal quality currently and at home intermittently -- would recommend f/u w/ ENT to r/o impact of Reflux from Esophageal Dysmotility as well as impact from Parkinson's Dis(per chart). Noted Hoarseness dx'd in 2019 per chart. ENT could refer pt for Outpatient Voice Therapy if indicated at that time. SLP Visit Diagnosis: Dysphagia, unspecified (R13.10) (has baseline REFLUX and Esophageal dysmotility; Parkinson's Dis per chart)    Aspiration Risk   (reduced following general precautions)    Diet Recommendation   Regular consistency diet w/ well-Cut meats, moistened foods for ease of mastication/intake; Thin liquids VIA CUP - less straw use d/t air swallowing(reflux). Recommend general aspiration precautions, REFLUX precautions including less/no Carbonated drinks during meals and sitting upright post meals for ~60 mins. F/u w/ GI for further management.   Medication Administration: Whole meds with puree    Other  Recommendations Recommended Consults: Consider ENT evaluation;Consider GI evaluation Oral Care Recommendations: Oral care BID;Oral care before and after PO;Patient independent with oral care Other Recommendations:  (n/a)    Recommendations for follow up therapy are one component of a multi-disciplinary discharge planning process, led by the attending physician.  Recommendations may be updated based on  patient status, additional functional criteria and insurance authorization.  Follow up Recommendations No SLP follow up (acutely)      Assistance Recommended at Discharge None  Functional Status Assessment Patient has had a recent decline in their  functional status and demonstrates the ability to make significant improvements in function in a reasonable and predictable amount of time.  Frequency and Duration  (n/a)   (n/a)       Prognosis Prognosis for Safe Diet Advancement: Good Barriers to Reach Goals: Time post onset;Severity of deficits      Swallow Study   General Date of Onset: 01/17/22 HPI: Pt is a 71 year old female with a past medical history significant for Esophageal strictures w/ Dilations per pt report(most recent ~6 months ago), Migraines, CHF, hypertension, mitral valve prolapse, E. coli UTIs, Parkinson's disease, anemia, and arthritis who presented to [facility] ED on 01/18/2022 due to complaints of syncope, left-sided weakness/heaviness, left-sided facial droop.  Per patient's report, she was at her doctors office earlier this morning getting a renal ultrasound.  When she stood up she became dizzy with syncopal episode (denies hitting head or any trauma).  When she came to, she exhibited left-sided weakness/heaviness, numbness, facial droop, and visual field cuts.  She endorsed a gravely/raspy vocal quality at home intermittently(would recommend f/u w/ ENT).  MRI: No acute intracranial pathology. Type of Study: Bedside Swallow Evaluation Previous Swallow Assessment: none Diet Prior to this Study: Dysphagia 3 (soft);Thin liquids (just ordered by MD) Temperature Spikes Noted: No (wbc 5.3) Respiratory Status: Nasal cannula (2L) History of Recent Intubation: No Behavior/Cognition: Alert;Cooperative;Pleasant mood Oral Cavity Assessment: Within Functional Limits Oral Care Completed by SLP: Yes Oral Cavity - Dentition: Dentures, top;Dentures, bottom Vision: Functional for self-feeding Self-Feeding Abilities: Able to feed self;Needs set up Patient Positioning: Upright in bed (gave better positioning/education) Baseline Vocal Quality:  (min raspy) Volitional Cough: Strong Volitional Swallow: Able to elicit    Oral/Motor/Sensory  Function Overall Oral Motor/Sensory Function: Within functional limits   Ice Chips Ice chips: Not tested   Thin Liquid Thin Liquid: Within functional limits Presentation: Cup;Self Fed (~7-8 ozs total) Other Comments: water, juice    Nectar Thick Nectar Thick Liquid: Not tested   Honey Thick Honey Thick Liquid: Not tested   Puree Puree: Within functional limits (w/ NSG)   Solid     Solid: Within functional limits Presentation: Self Fed;Spoon (5-6 trials) Other Comments: pancake, sausage        Orinda Kenner, MS, CCC-SLP Speech Language Pathologist Rehab Services; [place] [phone] (ascom) Valicia Rief 01/20/2022,12:14 PM

## 2022-01-20 NOTE — Progress Notes (Signed)
PROGRESS NOTE    Morgan [name]  ZHG:[id number] DOB: 1951/11/24 DOA: 01/18/2022 PCP: [name], MD  111A/111A-AA   Assessment & Plan:   Principal Problem:   Acute ischemic stroke [facility])   Morgan [name] is a 71 year old female with a past medical history significant for CHF, hypertension, mitral valve prolapse, Parkinson's disease, anemia, and arthritis who presented to [facility] ED on 01/18/2022 due to complaints of syncope, left-sided weakness/heaviness, left-sided facial droop, and left-sided visual field cut while at outpatient clinic getting a renal US.   Pt received TNK during stroke code and was admitted to ICU.  Subsequent MRI brain was neg for acute stroke.  Pt transferred to hospitalist service on 01/20/22.  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   --MRI brain neg for stroke. --headache didn't improve on cocktail of 15mg  IV toradol, 12.5mg  IV benadryl, and 10mg  IV decadron Plan: - Avoid compazine, phenergan, and reglan in the setting of Parkinson's disease, per neuro --IV Depacon 1000 mg x1 today for headache --Avoid narcotics, per neuro --PT/OT   Mild Leukocytosis ~ RESOLVED --not clinically significant   Acute Kidney Injury ~ IMPROVING Mild Hyponatremia ~ RESOLVED --oral hydration   Anemia of chronic disease --monitor  Asymptomatic bacteriuria --No urinary symptoms or septic. --will not treat with abx    DVT prophylaxis: Lovenox SQ Code Status: Full code  Family Communication:  Level of care: Med-Surg Dispo:   The patient is from: home Anticipated d/c is to: to be determined after repeat PT/OT Anticipated d/c date is: to be determined after repeat PT/OT  Patient currently is medically ready to d/c, but needs safe disposition.    Subjective and Interval History:  Pt reported headache only improved after Norco.  Still reported left-sided weakness, and absent peripheral vision to the left side.  No dysuria or bladder  pain.  Since pt doesn't have stroke on MRI, pt is not a candidate for CIR.     Objective: Vitals:   01/20/22 0735 01/20/22 0824 01/20/22 1144 01/20/22 1542  BP:  (!) 150/78 (!) 141/73 136/74  Pulse:  80 76 77  Resp: 20 18 18 18   Temp:  99.2 F (37.3 C) 98.5 F (36.9 C) 98.2 F (36.8 C)  TempSrc:      SpO2:  100% 95% 98%  Weight:      Height:        Intake/Output Summary (Last 24 hours) at 01/20/2022 1640 Last data filed at 01/20/2022 1515 Gross per 24 hour  Intake 766.1 ml  Output 800 ml  Net -33.9 ml   Filed Weights   01/18/22 1054 01/18/22 1100 01/18/22 1700  Weight: 107.5 kg 107.5 kg 107.6 kg    Examination:   Constitutional: NAD, AAOx3 HEENT: conjunctivae and lids normal, EOMI CV: No cyanosis.   RESP: normal respiratory effort, on RA SKIN: warm, dry Psych: Normal mood and affect.  Appropriate judgement and reason   Data Reviewed: I have personally reviewed following labs and imaging studies  CBC: Recent Labs  Lab 01/18/22 1137 01/19/22 0443 01/20/22 0543  WBC 10.9* 6.4 5.3  NEUTROABS 8.9*  --   --   HGB 9.2* 9.4* 10.3*  HCT 30.0* 30.8* 32.1*  MCV 86.5 83.7 83.4  PLT 226 236 196   Basic Metabolic Panel: Recent Labs  Lab 01/18/22 1137 01/19/22 0443 01/20/22 0543  NA 133* 138 140  K 4.2 3.9 4.0  CL 99 101 102  CO2 27 31 28   GLUCOSE 106* 97 121*  BUN 24* 21 29*  CREATININE 1.41* 1.21* 1.23*  CALCIUM 8.0* 8.3* 8.9  MG  --   --  2.1  PHOS  --   --  4.1   GFR: Estimated Creatinine Clearance: 61 mL/min (A) (by C-G formula based on SCr of 1.23 mg/dL (H)). Liver Function Tests: Recent Labs  Lab 01/18/22 1137  AST 14*  ALT 6  ALKPHOS 108  BILITOT 0.3  PROT 6.1*  ALBUMIN 3.2*   No results for input(s): LIPASE, AMYLASE in the last 168 hours. No results for input(s): AMMONIA in the last 168 hours. Coagulation Profile: Recent Labs  Lab 01/18/22 1137  INR 1.0   Cardiac Enzymes: No results for input(s): CKTOTAL, CKMB, CKMBINDEX,  TROPONINI in the last 168 hours. BNP (last 3 results) Recent Labs    08/29/21 0931  PROBNP 180.0*   HbA1C: Recent Labs    01/19/22 0443  HGBA1C 5.2   CBG: Recent Labs  Lab 01/18/22 1052 01/18/22 1701 01/18/22 2110  GLUCAP 98 76 85   Lipid Profile: Recent Labs    01/19/22 0443  CHOL 132  HDL 54  LDLCALC 63  TRIG 77  CHOLHDL 2.4   Thyroid Function Tests: No results for input(s): TSH, T4TOTAL, FREET4, T3FREE, THYROIDAB in the last 72 hours. Anemia Panel: No results for input(s): VITAMINB12, FOLATE, FERRITIN, TIBC, IRON, RETICCTPCT in the last 72 hours. Sepsis Labs: Recent Labs  Lab 01/18/22 1137  PROCALCITON <0.10    Recent Results (from the past 240 hour(s))  Resp Panel by RT-PCR (Flu A&B, Covid) Nasopharyngeal Swab     Status: None   Collection Time: 01/18/22 12:50 PM   Specimen: Nasopharyngeal Swab; Nasopharyngeal(NP) swabs in vial transport medium  Result Value Ref Range Status   SARS Coronavirus 2 by RT PCR NEGATIVE NEGATIVE Final    Comment: (NOTE) SARS-CoV-2 target nucleic acids are NOT DETECTED.  The SARS-CoV-2 RNA is generally detectable in upper respiratory specimens during the acute phase of infection. The lowest concentration of SARS-CoV-2 viral copies this assay can detect is 138 copies/mL. A negative result does not preclude SARS-Cov-2 infection and should not be used as the sole basis for treatment or other patient management decisions. A negative result may occur with  improper specimen collection/handling, submission of specimen other than nasopharyngeal swab, presence of viral mutation(s) within the areas targeted by this assay, and inadequate number of viral copies(<138 copies/mL). A negative result must be combined with clinical observations, patient history, and epidemiological information. The expected result is Negative.  Fact Sheet for Patients:  EntrepreneurPulse.com.au  Fact Sheet for Healthcare Providers:   IncredibleEmployment.be  This test is no t yet approved or cleared by the [place] FDA and  has been authorized for detection and/or diagnosis of SARS-CoV-2 by FDA under an Emergency Use Authorization (EUA). This EUA will remain  in effect (meaning this test can be used) for the duration of the COVID-19 declaration under Section 564(b)(1) of the Act, 21 U.S.C.section 360bbb-3(b)(1), unless the authorization is terminated  or revoked sooner.       Influenza A by PCR NEGATIVE NEGATIVE Final   Influenza B by PCR NEGATIVE NEGATIVE Final    Comment: (NOTE) The Xpert Xpress SARS-CoV-2/FLU/RSV plus assay is intended as an aid in the diagnosis of influenza from Nasopharyngeal swab specimens and should not be used as a sole basis for treatment. Nasal washings and aspirates are unacceptable for Xpert Xpress SARS-CoV-2/FLU/RSV testing.  Fact Sheet for Patients: EntrepreneurPulse.com.au  Fact Sheet for Healthcare Providers: IncredibleEmployment.be  This test is not yet approved or cleared by the [place] and has been authorized for detection and/or diagnosis of SARS-CoV-2 by FDA under an Emergency Use Authorization (EUA). This EUA will remain in effect (meaning this test can be used) for the duration of the COVID-19 declaration under Section 564(b)(1) of the Act, 21 U.S.C. section 360bbb-3(b)(1), unless the authorization is terminated or revoked.  Performed at [facility], [address] [place], [place] [address]   Urine Culture     Status: Abnormal (Preliminary result)   Collection Time: 01/18/22 12:58 PM   Specimen: Urine, Clean Catch  Result Value Ref Range Status   Specimen Description   Final    URINE, CLEAN CATCH Performed at [facility], [address] [place], [place] [address]    Special Requests   Final    NONE Performed at [facility], [address]  [place], [place] [address]    Culture (A)  Final    >=100,000 COLONIES/mL ESCHERICHIA COLI SUSCEPTIBILITIES TO FOLLOW Performed at [facility], [place] [address] [place],  [address]    Report Status PENDING  Incomplete  MRSA Next Gen by PCR, Nasal     Status: None   Collection Time: 01/18/22  4:59 PM   Specimen: Nasal Mucosa; Nasal Swab  Result Value Ref Range Status   MRSA by PCR Next Gen NOT DETECTED NOT DETECTED Final    Comment: (NOTE) The GeneXpert MRSA Assay (FDA approved for NASAL specimens only), is one component of a comprehensive MRSA colonization surveillance program. It is not intended to diagnose MRSA infection nor to guide or monitor treatment for MRSA infections. Test performance is not FDA approved in patients less than 66 years old. Performed at [facility], [address], [place],  [address]       Radiology Studies: CT HEAD WO CONTRAST (5MM)  Result Date: 01/19/2022 CLINICAL DATA:  Stroke, follow up EXAM: CT HEAD WITHOUT CONTRAST TECHNIQUE: Contiguous axial images were obtained from the base of the skull through the vertex without intravenous contrast. RADIATION DOSE REDUCTION: This exam was performed according to the departmental dose-optimization program which includes automated exposure control, adjustment of the mA and/or kV according to patient size and/or use of iterative reconstruction technique. COMPARISON:  January 18, 2022. FINDINGS: Brain: No evidence of acute infarction, hemorrhage, hydrocephalus, extra-axial collection or mass lesion/mass effect. Vascular: No hyperdense vessel identified. Skull: No acute fracture. Sinuses/Orbits: Clear sinuses.  Unremarkable orbits. Other: No mastoid effusions. IMPRESSION: No evidence of acute abnormality. Electronically Signed   By: Margaretha Sheffield M.D.   On: 01/19/2022 12:12   ECHOCARDIOGRAM COMPLETE  Result Date: 01/19/2022    ECHOCARDIOGRAM REPORT   Patient Name:   Morgan [name] Date of Exam:  01/19/2022 Medical Rec #:  [id number]     Height:       73.0 in Accession #:    [id number]    Weight:       237.2 lb Date of Birth:  Sep 02, 1951      BSA:          2.313 m Patient Age:    89 years      BP:           132/72 mmHg Patient Gender: F             HR:           68 bpm. Exam Location:  [facility] Procedure: 2D Echo, Color Doppler and Cardiac  Doppler Indications:     I63.9 Stroke  History:         Patient has prior history of Echocardiogram examinations. CHF;                  Risk Factors:Hypertension.  Sonographer:     Charmayne Sheer Referring Phys:  [id number] Bradly Bienenstock Diagnosing Phys: Donnelly Angelica  Sonographer Comments: No parasternal window and suboptimal subcostal window. IMPRESSIONS  1. Left ventricular ejection fraction, by estimation, is 60 to 65%. The left ventricle has normal function. The left ventricle has no regional wall motion abnormalities. Left ventricular diastolic parameters are consistent with Grade I diastolic dysfunction (impaired relaxation).  2. Right ventricular systolic function is normal. The right ventricular size is normal.  3. The mitral valve is normal in structure. Trivial mitral valve regurgitation. No evidence of mitral stenosis.  4. The aortic valve is normal in structure. Aortic valve regurgitation is not visualized. No aortic stenosis is present.  5. The inferior vena cava is normal in size with <50% respiratory variability, suggesting right atrial pressure of 8 mmHg. FINDINGS  Left Ventricle: Left ventricular ejection fraction, by estimation, is 60 to 65%. The left ventricle has normal function. The left ventricle has no regional wall motion abnormalities. The left ventricular internal cavity size was normal in size. There is  no left ventricular hypertrophy. Left ventricular diastolic parameters are consistent with Grade I diastolic dysfunction (impaired relaxation). Right Ventricle: The right ventricular size is normal. No increase in right ventricular wall thickness. Right  ventricular systolic function is normal. Left Atrium: Left atrial size was normal in size. Right Atrium: Right atrial size was normal in size. Pericardium: There is no evidence of pericardial effusion. Mitral Valve: The mitral valve is normal in structure. Trivial mitral valve regurgitation. No evidence of mitral valve stenosis. MV peak gradient, 6.4 mmHg. The mean mitral valve gradient is 3.0 mmHg. Tricuspid Valve: The tricuspid valve is normal in structure. Tricuspid valve regurgitation is not demonstrated. No evidence of tricuspid stenosis. Aortic Valve: The aortic valve is normal in structure. Aortic valve regurgitation is not visualized. No aortic stenosis is present. Aortic valve mean gradient measures 6.0 mmHg. Aortic valve peak gradient measures 12.5 mmHg. Pulmonic Valve: The pulmonic valve was normal in structure. Pulmonic valve regurgitation is not visualized. No evidence of pulmonic stenosis. Aorta: The aortic root is normal in size and structure. Venous: The inferior vena cava is normal in size with less than 50% respiratory variability, suggesting right atrial pressure of 8 mmHg. IAS/Shunts: The interatrial septum was not well visualized.   LV Volumes (MOD) LV vol d, MOD A2C: 72.4 ml Diastology LV vol d, MOD A4C: 93.2 ml LV e' medial:    5.11 cm/s LV vol s, MOD A2C: 27.3 ml LV E/e' medial:  17.7 LV vol s, MOD A4C: 26.7 ml LV e' lateral:   5.22 cm/s LV SV MOD A2C:     45.1 ml LV E/e' lateral: 17.3 LV SV MOD A4C:     93.2 ml LV SV MOD BP:      57.8 ml RIGHT VENTRICLE RV Basal diam:  2.55 cm RV S prime:     12.20 cm/s LEFT ATRIUM             Index        RIGHT ATRIUM          Index LA Vol (A2C):   19.9 ml 8.60 ml/m   RA Area:     9.10 cm  LA Vol (A4C):   33.6 ml 14.52 ml/m  RA Volume:   16.50 ml 7.13 ml/m LA Biplane Vol: 28.7 ml 12.41 ml/m  AORTIC VALVE AV Vmax:           177.00 cm/s AV Vmean:          120.000 cm/s AV VTI:            0.384 m AV Peak Grad:      12.5 mmHg AV Mean Grad:      6.0 mmHg LVOT  Vmax:         116.00 cm/s LVOT Vmean:        77.000 cm/s LVOT VTI:          0.264 m LVOT/AV VTI ratio: 0.69 MITRAL VALVE MV Area (PHT): 3.42 cm     SHUNTS MV Peak grad:  6.4 mmHg     Systemic VTI: 0.26 m MV Mean grad:  3.0 mmHg MV Vmax:       1.26 m/s MV Vmean:      75.3 cm/s MV Decel Time: 222 msec MV E velocity: 90.40 cm/s MV A velocity: 123.00 cm/s MV E/A ratio:  0.73 Donnelly Angelica Electronically signed by Donnelly Angelica Signature Date/Time: 01/19/2022/1:31:15 PM    Final      Scheduled Meds:   stroke: mapping our early stages of recovery book   Does not apply Once   aspirin  81 mg Oral Daily   Chlorhexidine Gluconate Cloth  6 each Topical Q0600   enoxaparin (LOVENOX) injection  40 mg Subcutaneous Q24H   pantoprazole (PROTONIX) IV  40 mg Intravenous QHS   Continuous Infusions:   LOS: 2 days     Enzo Bi, MD [facility] If 7PM-7AM, please contact night-coverage 01/20/2022, 4:40 PM

## 2022-01-20 NOTE — Progress Notes (Signed)
Inpatient Rehab Admissions Coordinator:   Per therapy recommendations, patient was screened for CIR candidacy by Clemens Catholic, MS, CCC-SLP . At this time, Pt. does not appear to demonstrate medical necessity to justify in hospital rehabilitation/CIR. will not pursue a rehab consult for this Pt. Per Neurology, no stroke on MRI and complex migraine is suspected etiology.   Recommend other rehab venues to be pursued.  Please contact me with any questions.   Clemens Catholic, [place], [facility] [facility] Admissions Coordinator  [phone] ([place]) [phone] (office)

## 2022-01-20 NOTE — Plan of Care (Signed)
°  Problem: Education: Goal: Knowledge of General Education information will improve Description: Including pain rating scale, medication(s)/side effects and non-pharmacologic comfort measures Outcome: Progressing   Problem: Health Behavior/Discharge Planning: Goal: Ability to manage health-related needs will improve Outcome: Progressing   Problem: Clinical Measurements: Goal: Ability to maintain clinical measurements within normal limits will improve Outcome: Progressing Goal: Will remain free from infection Outcome: Progressing Goal: Diagnostic test results will improve Outcome: Progressing Goal: Cardiovascular complication will be avoided Outcome: Progressing   Problem: Activity: Goal: Risk for activity intolerance will decrease Outcome: Progressing   Problem: Nutrition: Goal: Adequate nutrition will be maintained Outcome: Progressing   Problem: Coping: Goal: Level of anxiety will decrease Outcome: Progressing   Problem: Elimination: Goal: Will not experience complications related to bowel motility Outcome: Progressing Goal: Will not experience complications related to urinary retention Outcome: Progressing   Problem: Pain Managment: Goal: General experience of comfort will improve Outcome: Progressing   Problem: Safety: Goal: Ability to remain free from injury will improve Outcome: Progressing   Problem: Skin Integrity: Goal: Risk for impaired skin integrity will decrease Outcome: Progressing   Problem: Education: Goal: Knowledge of disease or condition will improve Outcome: Progressing Goal: Knowledge of secondary prevention will improve (SELECT ALL) Outcome: Progressing Goal: Knowledge of patient specific risk factors will improve (INDIVIDUALIZE FOR PATIENT) Outcome: Progressing Goal: Individualized Educational Video(s) Outcome: Progressing   Problem: Education: Goal: Knowledge of disease or condition will improve Outcome: Progressing Goal: Knowledge  of secondary prevention will improve (SELECT ALL) Outcome: Progressing Goal: Knowledge of patient specific risk factors will improve (INDIVIDUALIZE FOR PATIENT) Outcome: Progressing   Problem: Coping: Goal: Will identify appropriate support needs Outcome: Progressing   Problem: Health Behavior/Discharge Planning: Goal: Ability to manage health-related needs will improve Outcome: Progressing   Problem: Self-Care: Goal: Ability to participate in self-care as condition permits will improve Outcome: Progressing Goal: Ability to communicate needs accurately will improve Outcome: Progressing   Problem: Nutrition: Goal: Risk of aspiration will decrease Outcome: Progressing   Problem: Ischemic Stroke/TIA Tissue Perfusion: Goal: Complications of ischemic stroke/TIA will be minimized Outcome: Progressing

## 2022-01-20 NOTE — Progress Notes (Signed)
Physical Therapy Treatment Patient Details Name: Morgan [name] MRN: [id number] DOB: 1951-01-01 Today's Date: 01/20/2022   History of Present Illness Pt is a 71 y/o F who presented to the ED with c/o syncope, L sided weakness/heaviness, & L sided facial droop. Code stroke was called & pt received TNK at 1119; pt was admitted to the ICU for further work up & monitoring. Initial MRI was negative for acute processes. PMH: CHF, HTN, mitral valve prolapse, e. 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 She is A and oriented and agreeable to session. She states" It feels like my headache is coming back."  Pt is extremely pleasant and agreeable to session. Bed was wet from incontinence episode. She was able to roll L much easier than to her right. She is extremely incontinent. Had three episodes of urination during session. She was able to sit EOB however quickly has severe dizziness and needed to quickly be returned to bed due to syncopal concerns. Unable to tolerate sitting EOB long enough to get accurate BP reading. Once returned (max assist) to long sitting, BP 142/89. Pt continues to present with severe L side deficits and poor coordination. Tremors present but inconsistent throughout. Author highly recommends DC to CIR however per notes will be unable. Pt will need 24/7 assistance going forward. Will greatly benefit from as much PT as able going forward. Recs downgraded to SNF.     Recommendations for follow up therapy are one component of a multi-disciplinary discharge planning process, led by the attending physician.  Recommendations may be updated based on patient status, additional functional criteria and insurance authorization.  Follow Up Recommendations  Skilled nursing-short term rehab (<3 hours/day) (pt did not qualify for CIR placement even though highly recommended)     Assistance Recommended at Discharge Frequent or  constant Supervision/Assistance  Patient can return home with the following A lot of help with walking and/or transfers;A lot of help with bathing/dressing/bathroom;Assist for transportation;Assistance with cooking/housework;Direct supervision/assist for financial management;Help with stairs or ramp for entrance;Direct supervision/assist for medications management   Equipment Recommendations  None recommended by PT (defer to nex tlevel of care)    Recommendations for Other Services       Precautions / Restrictions Precautions Precautions: Fall Restrictions Weight Bearing Restrictions: Yes     Mobility  Bed Mobility Overal bed mobility: Needs Assistance Bed Mobility: Supine to Sit, Sit to Supine     Supine to sit: Mod assist, HOB elevated Sit to supine: Max assist   General bed mobility comments: Pt can roll L much easier than to the right due to left side deficits. She progressed from supine to short sit on L side of bed with increased time and vcs throughout. Pt becomes dizzy whiel sitting EOB and was unable to maintainf sitting EOB long enough to get BP reading. once returned to supine BP 142/89. Took several minutes for symptoms to resolve. Pt continues to present with tremors/poor coordinating in [place].    Transfers      General transfer comment: unsafe to attempt due to poor sitting tolerance and symptoms while sitting up.     Balance Overall balance assessment: Needs assistance Sitting-balance support: Bilateral upper extremity supported, Feet supported Sitting balance-Leahy Scale: Poor Sitting balance - Comments: pt has poor sitting balance due to dizziness       Cognition Arousal/Alertness: Awake/alert Behavior During Therapy: WFL for tasks  assessed/performed Overall Cognitive Status: Within Functional Limits for tasks assessed      General Comments: PT  was A and oriented and very pleasant throughout.               Pertinent Vitals/Pain Pain  Assessment Pain Assessment: 0-10 Pain Score: 3  Pain Location: calf when PT attempts to dorsiflexion L ankle & to palpation, c/o 7/10 HA Pain Descriptors / Indicators: Grimacing Pain Intervention(s): Limited activity within patient's tolerance, Monitored during session, Premedicated before session, Repositioned     PT Goals (current goals can now be found in the care plan section) Acute Rehab PT Goals Patient Stated Goal: get better Progress towards PT goals: Progressing toward goals    Frequency    7X/week      PT Plan Discharge plan needs to be updated    Co-evaluation     PT goals addressed during session: Mobility/safety with mobility;Balance;Strengthening/ROM;Proper use of DME        AM-PAC PT "6 Clicks" Mobility   Outcome Measure  Help needed turning from your back to your side while in a flat bed without using bedrails?: A Lot Help needed moving from lying on your back to sitting on the side of a flat bed without using bedrails?: A Lot Help needed moving to and from a bed to a chair (including a wheelchair)?: Total Help needed standing up from a chair using your arms (e.g., wheelchair or bedside chair)?: Total Help needed to walk in hospital room?: Total Help needed climbing 3-5 steps with a railing? : Total 6 Click Score: 8    End of Session   Activity Tolerance: Treatment limited secondary to medical complications (Comment) (limited by L side deficits and poor tolerance to mobility) Patient left: in bed;with bed alarm set Nurse Communication: Mobility status PT Visit Diagnosis: Difficulty in walking, not elsewhere classified (R26.2);Muscle weakness (generalized) (M62.81);Hemiplegia and hemiparesis Hemiplegia - Right/Left: Left Hemiplegia - dominant/non-dominant: Dominant Hemiplegia - caused by: Unspecified     Time: 5916-3846 PT Time Calculation (min) (ACUTE ONLY): 18 min  Charges:  $Therapeutic Activity: 8-22 mins                     Julaine Fusi PTA 01/20/22, 5:31 PM

## 2022-01-20 NOTE — Progress Notes (Signed)
S: Patient's deficits and headache are unchanged. She has a history of migraines but they have not been accompanied by weakness before. No benefit to toradrol/benadryl/decadron cocktail x2  MRI brain no e/o acute infarct (personal review) TTE: grade I disatolic dysfunction, no other sig abnl, no intrcardiac clot  O:  Today's Vitals   01/20/22 0735 01/20/22 0824 01/20/22 1144 01/20/22 1542  BP:  (!) 150/78 (!) 141/73 136/74  Pulse:  80 76 77  Resp: 20 18 18 18   Temp:  99.2 F (37.3 C) 98.5 F (36.9 C) 98.2 F (36.8 C)  TempSrc:      SpO2:  100% 95% 98%  Weight:      Height:      PainSc:       Body mass index is 31.3 kg/m.  Physical Exam Gen: alert, oriented to self, [facility], and year but not month HEENT: Atraumatic, normocephalic;mucous membranes moist; oropharynx clear, tongue without atrophy or fasciculations. Neck: Supple, trachea midline. Resp: CTAB, no w/r/r CV: RRR, no m/g/r; nml S1 and S2. 2+ symmetric peripheral pulses. Abd: soft/NT/ND; nabs x 4 quad Extrem: Nml bulk; no cyanosis, clubbing, or edema.   Neuro: *MS: alert, oriented to self, [facility], and year but not month *Speech: fluid, nondysarthric, able to name and repeat *CN:    I: Deferred   II,III: PERRLA, unable to distinguish between 1 and 2 fingers in L lateral hemifield of L eye only, otherwise VFF, optic discs unable to be visualized 2/2 pupillary constriction   III,IV,VI: EOMI w/o nystagmus, no ptosis   V: impaired to LT L face relative to R   VII: Eyelid closure was full.  L UMN facial droop   VIII: Hearing intact to voice   IX,X: Voice normal, palate elevates symmetrically    XI: SCM/trap 5/5 bilat   XII: Tongue protrudes midline, no atrophy or fasciculations  *Motor:   Normal bulk.  No tremor, rigidity or bradykinesia. Drift in LUE and LLE but not to bed. RUE and RLE full strength. Resting and postural tremor LUE>LLE (hx Parkinson's) *Sensory: Impaired to LT LUE and LLE. No double-simultaneous  extinction.  *Coordination:  FNF intact on R *Reflexes:  2+ and symmetric throughout without clonus; toes down-going bilat *Gait: deferred   NIHSS   1a Level of Conscious.: 0 1b LOC Questions: 1 1c LOC Commands: 0 2 Best Gaze: 0 3 Visual: 1 4 Facial Palsy: 1 5a Motor Arm - left: 1 5b Motor Arm - Right: 0 6a Motor Leg - Left: 1 6b Motor Leg - Right: 0 7 Limb Ataxia: 0 8 Sensory: 1 9 Best Language: 0 10 Dysarthria: 0 11 Extinct. and Inatten.: 0   TOTAL: 6     Premorbid mRS = 43  A/P: 71 year old woman with history of Parkinson's disease, hypertension, CHF who presented to the emergency department after developing vertigo and left-sided weakness at a doctor's appointment 2/2 and received TNK during stroke code. She continues to have persistent deficits and migrainous headache despite negative MRI. 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 Her stroke workup is completed, we will work on treating her headache in hopes that that alleviates her sx. - Continue ASA 81mg  daily - Avoid ergots 2/2 vasoconstriction - Avoid compazine, phenergan, and reglan in the setting of Parkinson's disease - Cocktail of 15mg  IV toradol, 12.5mg  IV benadryl, and 10mg  IV decadron given x2 with no benefit - Reasonable to try sumatriptan or IV depakote 1000mg ; d/w hospitalist - Will repeat MRI brain and orbits this time with  and without contrast given persistent visual and strength deficits - Will continue to follow  Su Monks, MD [facility] [phone]  If 7pm- 7am, please page neurology on call as listed in [place].

## 2022-01-21 ENCOUNTER — Inpatient Hospital Stay: Payer: Medicare HMO

## 2022-01-21 DIAGNOSIS — I639 Cerebral infarction, unspecified: Secondary | ICD-10-CM | POA: Diagnosis not present

## 2022-01-21 DIAGNOSIS — R531 Weakness: Secondary | ICD-10-CM | POA: Diagnosis not present

## 2022-01-21 LAB — BASIC METABOLIC PANEL
Anion gap: 8 (ref 5–15)
BUN: 30 mg/dL — ABNORMAL HIGH (ref 8–23)
CO2: 29 mmol/L (ref 22–32)
Calcium: 8.6 mg/dL — ABNORMAL LOW (ref 8.9–10.3)
Chloride: 101 mmol/L (ref 98–111)
Creatinine, Ser: 1.4 mg/dL — ABNORMAL HIGH (ref 0.44–1.00)
GFR, Estimated: 41 mL/min — ABNORMAL LOW (ref >60)
Glucose, Bld: 108 mg/dL — ABNORMAL HIGH (ref 70–99)
Potassium: 3.5 mmol/L (ref 3.5–5.1)
Sodium: 138 mmol/L (ref 135–145)

## 2022-01-21 LAB — CBC
HCT: 29.4 % — ABNORMAL LOW (ref 36.0–46.0)
Hemoglobin: 9.4 g/dL — ABNORMAL LOW (ref 12.0–15.0)
MCH: 26.2 pg (ref 26.0–34.0)
MCHC: 32 g/dL (ref 30.0–36.0)
MCV: 81.9 fL (ref 80.0–100.0)
Platelets: 248 10*3/uL (ref 150–400)
RBC: 3.59 MIL/uL — ABNORMAL LOW (ref 3.87–5.11)
RDW: 14.7 % (ref 11.5–15.5)
WBC: 9.1 10*3/uL (ref 4.0–10.5)
nRBC: 0 % (ref 0.0–0.2)

## 2022-01-21 LAB — URINE CULTURE: Culture: >=100000 — AB

## 2022-01-21 LAB — MAGNESIUM: Magnesium: 2.1 mg/dL (ref 1.7–2.4)

## 2022-01-21 MED ORDER — SUMATRIPTAN SUCCINATE 50 MG PO TABS
50.0000 mg | ORAL_TABLET | ORAL | Status: AC | PRN
  Administered 2022-01-21 (×2): 50 mg via ORAL
  Filled 2022-01-21 (×3): qty 1

## 2022-01-21 MED ORDER — SODIUM CHLORIDE 0.9 % IV SOLN: INTRAVENOUS | Status: AC

## 2022-01-21 MED ORDER — GADOBUTROL 1 MMOL/ML IV SOLN
10.0000 mL | Freq: Once | INTRAVENOUS | Status: AC | PRN
  Administered 2022-01-21: 10 mL via INTRAVENOUS

## 2022-01-21 NOTE — Progress Notes (Signed)
Physical Therapy Treatment Patient Details Name: Morgan [name] MRN: [id number] DOB: 06-13-1951 Today's Date: 01/21/2022   History of Present Illness Pt is a 71 y/o F who presented to the ED with c/o syncope, L sided weakness/heaviness, & L sided facial droop. Code stroke was called & pt received TNK at 1119; pt was admitted to the ICU for further work up & monitoring. Initial MRI was negative for acute processes. PMH: CHF, HTN, mitral valve prolapse, e. coli UTIs, PD, anemia, arthritis, COPD on 2L, CKD, BPPV    PT Comments    Pt stated she is feeling better today with improved HA but still feel weak.  BP supine at 30 degrees elevation 149/83 P 67 MAP 103.  She sits herself up long sitting in bed but unsupported with instant c/o dizziness.  BP 158/99 P 81 MAP 117.  She does c/o increased nausea and begins dry heaving.  Had received anti-nausea meds prior to session.  She is limited by further mobility at this time but does want to continue with supine AROM to tolerance x 10.  Pt given cold wash cloth.  Needs in reach and discussed with RN after session.   Recommendations for follow up therapy are one component of a multi-disciplinary discharge planning process, led by the attending physician.  Recommendations may be updated based on patient status, additional functional criteria and insurance authorization.  Follow Up Recommendations  Skilled nursing-short term rehab (<3 hours/day)     Assistance Recommended at Discharge Frequent or constant Supervision/Assistance  Patient can return home with the following A lot of help with walking and/or transfers;A lot of help with bathing/dressing/bathroom;Assist for transportation;Assistance with cooking/housework;Direct supervision/assist for financial management;Help with stairs or ramp for entrance;Direct supervision/assist for medications management   Equipment Recommendations  None recommended by PT    Recommendations for Other Services        Precautions / Restrictions Precautions Precautions: Fall     Mobility  Bed Mobility Overal bed mobility: Needs Assistance             General bed mobility comments: limited by nausea and dizziness but moved well from 30 to 90 degrees unsupported long sitting in bed    Transfers                   General transfer comment: unsafe to attempt due to poor sitting tolerance and symptoms while sitting up.    Ambulation/Gait                   Stairs             Wheelchair Mobility    Modified Rankin (Stroke Patients Only)       Balance Overall balance assessment: Needs assistance Sitting-balance support: Bilateral upper extremity supported, Feet supported Sitting balance-Leahy Scale: Poor Sitting balance - Comments: pt has poor sitting balance due to dizziness/ nausea                                    Cognition Arousal/Alertness: Awake/alert Behavior During Therapy: WFL for tasks assessed/performed Overall Cognitive Status: Within Functional Limits for tasks assessed                                          Exercises Other Exercises Other Exercises: supine AROM x 10  General Comments        Pertinent Vitals/Pain Pain Assessment Pain Assessment: Faces Faces Pain Scale: Hurts little more Pain Location: HA improved Pain Intervention(s): Limited activity within patient's tolerance, Monitored during session    Home Living                          Prior Function            PT Goals (current goals can now be found in the care plan section) Progress towards PT goals: Not progressing toward goals - comment    Frequency    7X/week      PT Plan Current plan remains appropriate    Co-evaluation              AM-PAC PT "6 Clicks" Mobility   Outcome Measure  Help needed turning from your back to your side while in a flat bed without using bedrails?: A Lot Help needed moving from  lying on your back to sitting on the side of a flat bed without using bedrails?: A Lot Help needed moving to and from a bed to a chair (including a wheelchair)?: Total Help needed standing up from a chair using your arms (e.g., wheelchair or bedside chair)?: Total Help needed to walk in hospital room?: Total Help needed climbing 3-5 steps with a railing? : Total 6 Click Score: 8    End of Session   Activity Tolerance: Other (comment) Patient left: in bed;with bed alarm set;with call bell/phone within reach Nurse Communication: Mobility status PT Visit Diagnosis: Difficulty in walking, not elsewhere classified (R26.2);Muscle weakness (generalized) (M62.81);Hemiplegia and hemiparesis Hemiplegia - Right/Left: Left Hemiplegia - dominant/non-dominant: Dominant Hemiplegia - caused by: Unspecified     Time: 8546-2703 PT Time Calculation (min) (ACUTE ONLY): 18 min  Charges:  $Therapeutic Exercise: 8-22 mins                    Chesley Noon, PTA 01/21/22, 12:36 PM

## 2022-01-21 NOTE — NC FL2 (Signed)
[place] LEVEL OF CARE SCREENING TOOL     IDENTIFICATION  Patient Name: Morgan [name] Birthdate: Sep 26, 1951 Sex: female Admission Date (Current Location): 01/18/2022  [facility] and [place] Number:  Engineering geologist and Address:  [facility], [address] [place], [place] [address]      Provider Number: [id number]  Attending Physician Name and Address:  Enzo Bi, MD  Relative Name and Phone Number:  Tyrell Antonio [phone] [facility])    Current Level of Care: Hospital Recommended Level of Care: [place] Prior Approval Number:    Date Approved/Denied:   PASRR Number: [id number]  Discharge Plan:      Current Diagnoses: Patient Active Problem List   Diagnosis Date Noted   Acute ischemic stroke ([place]) 01/18/2022   Hypotension 10/27/2021   Tremor 10/27/2021   Microcytic anemia 09/11/2021   Stage 3a chronic kidney disease ([place]) 09/11/2021   IDA (iron deficiency anemia) 09/11/2021   Elevated TSH 08/28/2021   Osteopenia 08/28/2021   DOE (dyspnea on exertion) 08/28/2021   Acute midline thoracic back pain 06/26/2021   Other chronic pain 06/26/2021   Chest pressure 02/20/2021   Left sided abdominal pain 02/01/2021   Chronic diastolic CHF (congestive heart failure) ([place]) 08/09/2020   COPD (chronic obstructive pulmonary disease) ([place]) 08/09/2020   Chronic respiratory failure with hypoxia ([place]) 08/09/2020   Polyp of sigmoid colon    Polyp of cecum    Gastritis and gastroduodenitis    Pylorus ulcer    Pyloric stenosis in adult    Chronic cholecystitis 05/19/2020   Oxygen dependent 03/30/2020   Nausea and vomiting in adult 03/22/2020   Peptic ulcer disease 03/22/2020   Iron deficiency anemia 03/22/2020   Acute pain of right knee 12/29/2019   Acute left-sided low back pain without sciatica 12/29/2019   Chronic abdominal pain 02/19/2019   Asymptomatic bacteriuria 02/19/2019   CKD (chronic kidney disease)  stage 3, GFR 30-59 ml/min (HCC) 01/13/2019   CHF (congestive heart failure) ([place]) 01/13/2019   Dizziness 01/13/2019   Hoarseness 11/12/2018   Dysphagia 11/12/2018   Sleepiness 11/12/2018   CVA (cerebral vascular accident) ([place]) 08/22/2018   Left-sided weakness 10/04/2017   HLD (hyperlipidemia) 10/04/2017   GERD (gastroesophageal reflux disease) 10/04/2017   Anxiety 08/26/2017   Essential hypertension    Depression    Diarrhea 02/22/2015   Hypokalemia 02/22/2015   Chronic lower back pain 04/29/2013   Paresthesia 04/29/2013    Orientation RESPIRATION BLADDER Height & Weight     Self, Time, Situation, Place  O2 External catheter Weight: 237 lb 3.4 oz (107.6 kg) Height:  6\' 1"  (185.4 cm)  BEHAVIORAL SYMPTOMS/MOOD NEUROLOGICAL BOWEL NUTRITION STATUS        Diet (regular, thin liquids)  AMBULATORY STATUS COMMUNICATION OF NEEDS Skin   Extensive Assist Verbally Bruising                       Personal Care Assistance Level of Assistance  Bathing, Feeding, Dressing Bathing Assistance: Maximum assistance Feeding assistance: Limited assistance Dressing Assistance: Maximum assistance     Functional Limitations Info             SPECIAL CARE FACTORS FREQUENCY  PT (By licensed PT), OT (By licensed OT)     PT Frequency: 5 times per week OT Frequency: 5 times per week            Contractures      Additional Factors Info  Code Status, Allergies Code Status Info: full Allergies Info: meperidine, strawberry extract, sucralfate, wasp venom           Current Medications (01/21/2022):  This is the current hospital active medication list Current Facility-Administered Medications  Medication Dose Route Frequency Provider Last Rate Last Admin    stroke: mapping our early stages of recovery book   Does not apply Once Darel Hong D, NP       0.9 %  sodium chloride infusion   Intravenous Continuous Enzo Bi, MD 75 mL/hr at 01/21/22 1057 New Bag at 01/21/22 1057    acetaminophen (TYLENOL) tablet 650 mg  650 mg Oral Q4H PRN Bradly Bienenstock, NP   650 mg at 01/21/22 2130   Or   acetaminophen (TYLENOL) 160 MG/5ML solution 650 mg  650 mg Per Tube Q4H PRN Darel Hong D, NP       Or   acetaminophen (TYLENOL) suppository 650 mg  650 mg Rectal Q4H PRN Bradly Bienenstock, NP   650 mg at 01/19/22 1343   aspirin chewable tablet 81 mg  81 mg Oral Daily Derek Jack, MD   81 mg at 01/21/22 8657   Chlorhexidine Gluconate Cloth 2 % PADS 6 each  6 each Topical Q0600 Flora Lipps, MD   6 each at 01/21/22 0940   enoxaparin (LOVENOX) injection 40 mg  40 mg Subcutaneous Q24H Derek Jack, MD   40 mg at 01/20/22 2004   labetalol (NORMODYNE) injection 10 mg  10 mg Intravenous Q2H PRN Bradly Bienenstock, NP       ondansetron [facility]) injection 4 mg  4 mg Intravenous Q6H PRN Flora Lipps, MD   4 mg at 01/21/22 1053   pantoprazole (PROTONIX) injection 40 mg  40 mg Intravenous QHS Darel Hong D, NP   40 mg at 01/20/22 2004   senna-docusate (Senokot-S) tablet 1 tablet  1 tablet Oral QHS PRN Bradly Bienenstock, NP         Discharge Medications: Please see discharge summary for a list of discharge medications.  Relevant Imaging Results:  Relevant Lab Results:   Additional Information SS #: [id number]  [place], LCSW

## 2022-01-21 NOTE — TOC Progression Note (Signed)
Transition of Care [facility]) - Progression Note    Patient Details  Name: Morgan [name] MRN: [id number] Date of Birth: December 13, 1951  Transition of Care [facility]) CM/SW [place], LCSW Phone Number: 01/21/2022, 11:06 AM  Clinical Narrative:   CSW spoke with patient. Informed her that per notes, she is not eligible for CIR. Patient is agreeable to SNF. Has been to [facility] in the past and prefers to go there again. CSW is starting SNF work up.   Expected Discharge Plan: IP Rehab Facility Barriers to Discharge: Continued Medical Work up  Expected Discharge Plan and Services Expected Discharge Plan: [place]   Discharge Planning Services: CM Consult   Living arrangements for the past 2 months: Single Family Home                 DME Arranged: N/A DME Agency: NA                   Social Determinants of Health (SDOH) Interventions    Readmission Risk Interventions No flowsheet data found.

## 2022-01-21 NOTE — Progress Notes (Signed)
PROGRESS NOTE    SARI COGAN  YFV:[id number] DOB: 1951-08-12 DOA: 01/18/2022 PCP: [name], MD  111A/111A-AA   Assessment & Plan:   Principal Problem:   Acute ischemic stroke [facility])   Lorella Gomez is a 71 year old female with a past medical history significant for CHF, hypertension, mitral valve prolapse, Parkinson's disease, anemia, and arthritis who presented to [facility] ED on 01/18/2022 due to complaints of syncope, left-sided weakness/heaviness, left-sided facial droop, and left-sided visual field cut while at outpatient clinic getting a renal US.   Pt received TNK during stroke code and was admitted to ICU.  Subsequent MRI brain was neg for acute stroke.  Pt transferred to hospitalist service on 01/20/22.  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   --MRI brain neg for stroke.  Per Neuro Dr. Quinn Axe, symptoms may be due to complex migraine or conversion disorder. --headache didn't improve on cocktail of 15mg  IV toradol, 12.5mg  IV benadryl, and 10mg  IV decadron --s/p IV Depacon 1000 mg x1 today for headache Plan: - Avoid compazine, phenergan, and reglan in the setting of Parkinson's disease, per neuro --Avoid narcotics, per neuro --try sumatriptan PRN --PT/OT   Urinary incontinence --discussed with Dr. Quinn Axe, who may order spine imaging.  Mild Leukocytosis ~ RESOLVED --not clinically significant   Acute Kidney Injury  Mild Hyponatremia ~ RESOLVED --Cr trending up again --start NS@75  for 12 hours   Anemia of chronic disease --Hgb 9-10's --Monitor  Asymptomatic bacteriuria --No urinary symptoms or septic. --will not treat with abx    DVT prophylaxis: Lovenox SQ Code Status: Full code  Family Communication:  Level of care: Med-Surg Dispo:   The patient is from: home Anticipated d/c is to: SNF rehab Anticipated d/c date is: 1-2 days  Patient currently is not medically ready to d/c: may need further spine imaging, per  neuro   Subjective and Interval History:  Still had left visual field cut, left-sided numbness and weakness.  PT also mentioned severe urinary incontinence, which is also new.  No back pain.  Discussed with neuro Dr. Quinn Axe about need for scan of spine.   Objective: Vitals:   01/20/22 2338 01/21/22 0507 01/21/22 0745 01/21/22 1156  BP: (!) 141/74 (!) 143/76 (!) 149/76 (!) 149/83  Pulse: 66 72 66 73  Resp: 16 16 18 18   Temp: 98 F (36.7 C) 98.2 F (36.8 C) 98.3 F (36.8 C) 98.5 F (36.9 C)  TempSrc: Oral Oral    SpO2: 98% 97% 98% 98%  Weight:      Height:        Intake/Output Summary (Last 24 hours) at 01/21/2022 1640 Last data filed at 01/21/2022 1404 Gross per 24 hour  Intake 240 ml  Output 300 ml  Net -60 ml   Filed Weights   01/18/22 1054 01/18/22 1100 01/18/22 1700  Weight: 107.5 kg 107.5 kg 107.6 kg    Examination:   Constitutional: NAD, AAOx3 HEENT: conjunctivae and lids normal, EOMI CV: No cyanosis.   RESP: normal respiratory effort Extremities: No effusions, edema in BLE SKIN: warm, dry Neuro: couldn't hold her left arm or leg up   Data Reviewed: I have personally reviewed following labs and imaging studies  CBC: Recent Labs  Lab 01/18/22 1137 01/19/22 0443 01/20/22 0543 01/21/22 0317  WBC 10.9* 6.4 5.3 9.1  NEUTROABS 8.9*  --   --   --   HGB 9.2* 9.4* 10.3* 9.4*  HCT 30.0* 30.8* 32.1* 29.4*  MCV 86.5 83.7 83.4 81.9  PLT 226 236 269 786   Basic Metabolic Panel: Recent Labs  Lab 01/18/22 1137 01/19/22 0443 01/20/22 0543 01/21/22 0317  NA 133* 138 140 138  K 4.2 3.9 4.0 3.5  CL 99 101 102 101  CO2 27 31 28 29   GLUCOSE 106* 97 121* 108*  BUN 24* 21 29* 30*  CREATININE 1.41* 1.21* 1.23* 1.40*  CALCIUM 8.0* 8.3* 8.9 8.6*  MG  --   --  2.1 2.1  PHOS  --   --  4.1  --    GFR: Estimated Creatinine Clearance: 53.6 mL/min (A) (by C-G formula based on SCr of 1.4 mg/dL (H)). Liver Function Tests: Recent Labs  Lab 01/18/22 1137  AST 14*   ALT 6  ALKPHOS 108  BILITOT 0.3  PROT 6.1*  ALBUMIN 3.2*   No results for input(s): LIPASE, AMYLASE in the last 168 hours. No results for input(s): AMMONIA in the last 168 hours. Coagulation Profile: Recent Labs  Lab 01/18/22 1137  INR 1.0   Cardiac Enzymes: No results for input(s): CKTOTAL, CKMB, CKMBINDEX, TROPONINI in the last 168 hours. BNP (last 3 results) Recent Labs    08/29/21 0931  PROBNP 180.0*   HbA1C: Recent Labs    01/19/22 0443  HGBA1C 5.2   CBG: Recent Labs  Lab 01/18/22 1052 01/18/22 1701 01/18/22 2110  GLUCAP 98 76 85   Lipid Profile: Recent Labs    01/19/22 0443  CHOL 132  HDL 54  LDLCALC 63  TRIG 77  CHOLHDL 2.4   Thyroid Function Tests: No results for input(s): TSH, T4TOTAL, FREET4, T3FREE, THYROIDAB in the last 72 hours. Anemia Panel: No results for input(s): VITAMINB12, FOLATE, FERRITIN, TIBC, IRON, RETICCTPCT in the last 72 hours. Sepsis Labs: Recent Labs  Lab 01/18/22 1137  PROCALCITON <0.10    Recent Results (from the past 240 hour(s))  Resp Panel by RT-PCR (Flu A&B, Covid) Nasopharyngeal Swab     Status: None   Collection Time: 01/18/22 12:50 PM   Specimen: Nasopharyngeal Swab; Nasopharyngeal(NP) swabs in vial transport medium  Result Value Ref Range Status   SARS Coronavirus 2 by RT PCR NEGATIVE NEGATIVE Final    Comment: (NOTE) SARS-CoV-2 target nucleic acids are NOT DETECTED.  The SARS-CoV-2 RNA is generally detectable in upper respiratory specimens during the acute phase of infection. The lowest concentration of SARS-CoV-2 viral copies this assay can detect is 138 copies/mL. A negative result does not preclude SARS-Cov-2 infection and should not be used as the sole basis for treatment or other patient management decisions. A negative result may occur with  improper specimen collection/handling, submission of specimen other than nasopharyngeal swab, presence of viral mutation(s) within the areas targeted by this  assay, and inadequate number of viral copies(<138 copies/mL). A negative result must be combined with clinical observations, patient history, and epidemiological information. The expected result is Negative.  Fact Sheet for Patients:  EntrepreneurPulse.com.au  Fact Sheet for Healthcare Providers:  IncredibleEmployment.be  This test is no t yet approved or cleared by the [place] FDA and  has been authorized for detection and/or diagnosis of SARS-CoV-2 by FDA under an Emergency Use Authorization (EUA). This EUA will remain  in effect (meaning this test can be used) for the duration of the COVID-19 declaration under Section 564(b)(1) of the Act, 21 U.S.C.section 360bbb-3(b)(1), unless the authorization is terminated  or revoked sooner.       Influenza A by PCR NEGATIVE NEGATIVE Final   Influenza B by PCR NEGATIVE NEGATIVE Final  Comment: (NOTE) The Xpert Xpress SARS-CoV-2/FLU/RSV plus assay is intended as an aid in the diagnosis of influenza from Nasopharyngeal swab specimens and should not be used as a sole basis for treatment. Nasal washings and aspirates are unacceptable for Xpert Xpress SARS-CoV-2/FLU/RSV testing.  Fact Sheet for Patients: EntrepreneurPulse.com.au  Fact Sheet for Healthcare Providers: IncredibleEmployment.be  This test is not yet approved or cleared by the [place] FDA and has been authorized for detection and/or diagnosis of SARS-CoV-2 by FDA under an Emergency Use Authorization (EUA). This EUA will remain in effect (meaning this test can be used) for the duration of the COVID-19 declaration under Section 564(b)(1) of the Act, 21 U.S.C. section 360bbb-3(b)(1), unless the authorization is terminated or revoked.  Performed at [facility], [address] [place], [place] [address]   Urine Culture     Status: Abnormal   Collection Time: 01/18/22 12:58 PM    Specimen: Urine, Clean Catch  Result Value Ref Range Status   Specimen Description   Final    URINE, CLEAN CATCH Performed at [facility], [address] [place], [place] [address]    Special Requests   Final    NONE Performed at [facility], [place], [place] [address]    Culture >=100,000 COLONIES/mL ESCHERICHIA COLI (A)  Final   Report Status 01/21/2022 FINAL  Final   Organism ID, Bacteria ESCHERICHIA COLI (A)  Final      Susceptibility   Escherichia coli - MIC*    AMPICILLIN 8 SENSITIVE Sensitive     CEFAZOLIN <=4 SENSITIVE Sensitive     CEFEPIME <=0.12 SENSITIVE Sensitive     CEFTRIAXONE <=0.25 SENSITIVE Sensitive     CIPROFLOXACIN <=0.25 SENSITIVE Sensitive     GENTAMICIN <=1 SENSITIVE Sensitive     IMIPENEM <=0.25 SENSITIVE Sensitive     NITROFURANTOIN <=16 SENSITIVE Sensitive     TRIMETH/SULFA <=20 SENSITIVE Sensitive     AMPICILLIN/SULBACTAM 4 SENSITIVE Sensitive     PIP/TAZO <=4 SENSITIVE Sensitive     * >=100,000 COLONIES/mL ESCHERICHIA COLI  MRSA Next Gen by PCR, Nasal     Status: None   Collection Time: 01/18/22  4:59 PM   Specimen: Nasal Mucosa; Nasal Swab  Result Value Ref Range Status   MRSA by PCR Next Gen NOT DETECTED NOT DETECTED Final    Comment: (NOTE) The GeneXpert MRSA Assay (FDA approved for NASAL specimens only), is one component of a comprehensive MRSA colonization surveillance program. It is not intended to diagnose MRSA infection nor to guide or monitor treatment for MRSA infections. Test performance is not FDA approved in patients less than 71 years old. Performed at [facility], [address] [place], [place] [address]       Radiology Studies: MR BRAIN W WO CONTRAST  Result Date: 01/20/2022 CLINICAL DATA:  TIA, left-sided weakness and left hemi field cut EXAM: MRI HEAD AND ORBITS WITHOUT AND WITH CONTRAST TECHNIQUE: Multiplanar, multiecho pulse sequences of the brain and surrounding  structures were obtained without and with intravenous contrast. Multiplanar, multiecho pulse sequences of the orbits and surrounding structures were obtained including fat saturation techniques, before and after intravenous contrast administration. CONTRAST:  30mL GADAVIST GADOBUTROL 1 MMOL/ML IV SOLN COMPARISON:  01/18/2022 MRI brain FINDINGS: MRI HEAD FINDINGS Evaluation is limited by motion artifact. Brain: No restricted diffusion to suggest acute or subacute infarct. No acute hemorrhage, mass, mass effect, or midline shift. No hemosiderin deposition to suggest remote hemorrhage. No hydrocephalus or extra-axial collection.  Scattered T2 hyperintense signal in the periventricular white matter, likely the sequela of minimal chronic small vessel ischemic disease. No abnormal parenchymal or meningeal enhancement. Vascular: Normal flow voids and opacified vasculature. Skull and upper cervical spine: Normal marrow signal. Other: None. MRI ORBITS FINDINGS Evaluation is limited by motion artifact. Orbits: No traumatic or inflammatory finding. Globes, optic nerves, orbital fat, extraocular muscles, vascular structures, and lacrimal glands are normal. Visualized sinuses: Clear. Soft tissues: Negative. IMPRESSION: Evaluation is limited by motion artifact. Within this limitation, no acute intracranial or orbital process. Electronically Signed   By: Merilyn Baba M.D.   On: 01/20/2022 22:57   MR ORBITS W WO CONTRAST  Result Date: 01/20/2022 CLINICAL DATA:  TIA, left-sided weakness and left hemi field cut EXAM: MRI HEAD AND ORBITS WITHOUT AND WITH CONTRAST TECHNIQUE: Multiplanar, multiecho pulse sequences of the brain and surrounding structures were obtained without and with intravenous contrast. Multiplanar, multiecho pulse sequences of the orbits and surrounding structures were obtained including fat saturation techniques, before and after intravenous contrast administration. CONTRAST:  61mL GADAVIST GADOBUTROL 1 MMOL/ML  IV SOLN COMPARISON:  01/18/2022 MRI brain FINDINGS: MRI HEAD FINDINGS Evaluation is limited by motion artifact. Brain: No restricted diffusion to suggest acute or subacute infarct. No acute hemorrhage, mass, mass effect, or midline shift. No hemosiderin deposition to suggest remote hemorrhage. No hydrocephalus or extra-axial collection. Scattered T2 hyperintense signal in the periventricular white matter, likely the sequela of minimal chronic small vessel ischemic disease. No abnormal parenchymal or meningeal enhancement. Vascular: Normal flow voids and opacified vasculature. Skull and upper cervical spine: Normal marrow signal. Other: None. MRI ORBITS FINDINGS Evaluation is limited by motion artifact. Orbits: No traumatic or inflammatory finding. Globes, optic nerves, orbital fat, extraocular muscles, vascular structures, and lacrimal glands are normal. Visualized sinuses: Clear. Soft tissues: Negative. IMPRESSION: Evaluation is limited by motion artifact. Within this limitation, no acute intracranial or orbital process. Electronically Signed   By: Merilyn Baba M.D.   On: 01/20/2022 22:57     Scheduled Meds:   stroke: mapping our early stages of recovery book   Does not apply Once   aspirin  81 mg Oral Daily   Chlorhexidine Gluconate Cloth  6 each Topical Q0600   enoxaparin (LOVENOX) injection  40 mg Subcutaneous Q24H   pantoprazole (PROTONIX) IV  40 mg Intravenous QHS   Continuous Infusions:  sodium chloride 75 mL/hr at 01/21/22 1057     LOS: 3 days     Enzo Bi, MD [facility] If 7PM-7AM, please contact night-coverage 01/21/2022, 4:40 PM

## 2022-01-21 NOTE — Progress Notes (Signed)
S: Patient's deficits and headache are unchanged. She has a history of migraines but they have not been accompanied by weakness before. No benefit to toradrol/benadryl/decadron cocktail x2. Some improvement after 1000mg  IV depacon but neurologic deficits remain.  MRI brain and orbits wwo contrast no acute findings.  TTE: grade I disatolic dysfunction, no other sig abnl, no intrcardiac clot  O:  Today's Vitals   01/21/22 0745 01/21/22 0936 01/21/22 1024 01/21/22 1156  BP: (!) 149/76   (!) 149/83  Pulse: 66   73  Resp: 18   18  Temp: 98.3 F (36.8 C)   98.5 F (36.9 C)  TempSrc:      SpO2: 98%   98%  Weight:      Height:      PainSc:  3  0-No pain    Body mass index is 31.3 kg/m.  Physical Exam Gen: alert, oriented to self, [facility], and year but not month HEENT: Atraumatic, normocephalic;mucous membranes moist; oropharynx clear, tongue without atrophy or fasciculations. Neck: Supple, trachea midline. Resp: CTAB, no w/r/r CV: RRR, no m/g/r; nml S1 and S2. 2+ symmetric peripheral pulses. Abd: soft/NT/ND; nabs x 4 quad Extrem: Nml bulk; no cyanosis, clubbing, or edema.   Neuro: *MS: alert, oriented to self, [facility], and year but not month *Speech: fluid, nondysarthric, able to name and repeat *CN:    I: Deferred   II,III: PERRLA, unable to distinguish between 1 and 2 fingers in L lateral hemifield of L eye only, otherwise VFF, optic discs unable to be visualized 2/2 pupillary constriction   III,IV,VI: EOMI w/o nystagmus, no ptosis   V: impaired to LT L face relative to R   VII: Eyelid closure was full.  L UMN facial droop   VIII: Hearing intact to voice   IX,X: Voice normal, palate elevates symmetrically    XI: SCM/trap 5/5 bilat   XII: Tongue protrudes midline, no atrophy or fasciculations  *Motor:   Normal bulk.  No tremor, rigidity or bradykinesia. Drift in LUE and LLE but not to bed. RUE and RLE full strength. Resting and postural tremor LUE>LLE (hx Parkinson's) *Sensory:  Impaired to LT LUE and LLE. No double-simultaneous extinction.  *Coordination:  FNF intact on R *Reflexes:  2+ and symmetric throughout without clonus; toes down-going bilat *Gait: deferred   NIHSS   1a Level of Conscious.: 0 1b LOC Questions: 1 1c LOC Commands: 0 2 Best Gaze: 0 3 Visual: 1 4 Facial Palsy: 1 5a Motor Arm - left: 1 5b Motor Arm - Right: 0 6a Motor Leg - Left: 1 6b Motor Leg - Right: 0 7 Limb Ataxia: 0 8 Sensory: 1 9 Best Language: 0 10 Dysarthria: 0 11 Extinct. and Inatten.: 0   TOTAL: 6     Premorbid mRS = 35  A/P: 71 year old woman with history of Parkinson's disease, hypertension, CHF who presented to the emergency department after developing vertigo and left-sided weakness at a doctor's appointment 2/2 and received TNK during stroke code. She continues to have persistent deficits and migrainous headache despite negative MRI. I suspect this is all complex migraine vs functional neurologic disorder.  - Continue ASA 81mg  daily - Avoid ergots 2/2 vasoconstriction - Avoid compazine, phenergan, and reglan in the setting of Parkinson's disease - Cocktail of 15mg  IV toradol, 12.5mg  IV benadryl, and 10mg  IV decadron given x2 with no benefit - Reasonable to try sumatriptan or IV depakote 1000mg ; d/w hospitalist - Will perform MRI c and t spine wwo tonight 2/2 persistent L  sided deficits with urinary incontinence. If these are negative neurology is unlikely to have anything else to offer in terms of workup - Will continue to follow  Su Monks, MD [facility] [phone]  If 7pm- 7am, please page neurology on call as listed in [place].

## 2022-01-22 DIAGNOSIS — R29818 Other symptoms and signs involving the nervous system: Secondary | ICD-10-CM

## 2022-01-22 DIAGNOSIS — R2990 Unspecified symptoms and signs involving the nervous system: Secondary | ICD-10-CM | POA: Diagnosis not present

## 2022-01-22 DIAGNOSIS — R531 Weakness: Secondary | ICD-10-CM | POA: Diagnosis not present

## 2022-01-22 DIAGNOSIS — I639 Cerebral infarction, unspecified: Secondary | ICD-10-CM | POA: Diagnosis not present

## 2022-01-22 LAB — BASIC METABOLIC PANEL
Anion gap: 7 (ref 5–15)
BUN: 20 mg/dL (ref 8–23)
CO2: 31 mmol/L (ref 22–32)
Calcium: 8.7 mg/dL — ABNORMAL LOW (ref 8.9–10.3)
Chloride: 100 mmol/L (ref 98–111)
Creatinine, Ser: 1.25 mg/dL — ABNORMAL HIGH (ref 0.44–1.00)
GFR, Estimated: 47 mL/min — ABNORMAL LOW (ref >60)
Glucose, Bld: 101 mg/dL — ABNORMAL HIGH (ref 70–99)
Potassium: 3.5 mmol/L (ref 3.5–5.1)
Sodium: 138 mmol/L (ref 135–145)

## 2022-01-22 LAB — CBC
HCT: 31.7 % — ABNORMAL LOW (ref 36.0–46.0)
Hemoglobin: 10 g/dL — ABNORMAL LOW (ref 12.0–15.0)
MCH: 26.4 pg (ref 26.0–34.0)
MCHC: 31.5 g/dL (ref 30.0–36.0)
MCV: 83.6 fL (ref 80.0–100.0)
Platelets: 257 10*3/uL (ref 150–400)
RBC: 3.79 MIL/uL — ABNORMAL LOW (ref 3.87–5.11)
RDW: 14.7 % (ref 11.5–15.5)
WBC: 8.8 10*3/uL (ref 4.0–10.5)
nRBC: 0 % (ref 0.0–0.2)

## 2022-01-22 LAB — MAGNESIUM: Magnesium: 1.9 mg/dL (ref 1.7–2.4)

## 2022-01-22 MED ORDER — SUMATRIPTAN SUCCINATE 50 MG PO TABS
50.0000 mg | ORAL_TABLET | Freq: Two times a day (BID) | ORAL | Status: DC | PRN
  Administered 2022-01-22 – 2022-01-30 (×15): 50 mg via ORAL
  Filled 2022-01-22 (×16): qty 1

## 2022-01-22 MED ORDER — LORAZEPAM 0.5 MG PO TABS
0.5000 mg | ORAL_TABLET | Freq: Every day | ORAL | Status: DC | PRN
  Administered 2022-01-23 – 2022-01-29 (×3): 0.5 mg via ORAL
  Filled 2022-01-22 (×3): qty 1

## 2022-01-22 NOTE — Progress Notes (Signed)
Ch was in ED when Pt was brought in last week. When I saw she was "on my floor" I wanted to visit. She remembered me and recognized me gladly. Pleased with care. We had prayer together; I look forward to seeing her again.

## 2022-01-22 NOTE — Progress Notes (Signed)
PROGRESS NOTE    Morgan [name]  KVQ:[id number] DOB: [birth year] DOA: 01/18/2022 PCP: [name], MD  111A/111A-AA   Assessment & Plan:   Principal Problem:   Acute ischemic stroke [facility])   Morgan [name] is a 71 year old female with a past medical history significant for CHF, hypertension, mitral valve prolapse, Parkinson's disease, anemia, and arthritis who presented to [facility] ED on 01/18/2022 due to complaints of syncope, left-sided weakness/heaviness, left-sided facial droop, and left-sided visual field cut while at outpatient clinic getting a renal US.   Pt received TNK during stroke code and was admitted to ICU.  Subsequent MRI brain was neg for acute stroke.  Pt transferred to hospitalist service on 01/20/22.  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   --MRI brain neg for stroke.  Per Neuro Dr. Quinn Axe, symptoms may be due to complex migraine or conversion disorder.  MRI spine also neg for acute finding to explain pt's left-sided weakness and urinary incontinence. --headache didn't improve on cocktail of 15mg  IV toradol, 12.5mg  IV benadryl, and 10mg  IV decadron --s/p IV Depacon 1000 mg x1 for headache Plan: - Avoid compazine, phenergan, and reglan in the setting of Parkinson's disease, per neuro --Avoid narcotics, per neuro --sumatriptan 50 mg BID PRN --PT/OT   Urinary incontinence --MRI spine also neg for acute finding to explain pt's left-sided weakness and urinary incontinence.  Mild Leukocytosis ~ RESOLVED --not clinically significant   Acute Kidney Injury  Mild Hyponatremia ~ RESOLVED --s/p intermittent IVF --oral hydration now   Anemia of chronic disease --Hgb 9-10's --monitor  Asymptomatic bacteriuria --No urinary symptoms or septic. --no need for abx   DVT prophylaxis: Lovenox SQ Code Status: Full code  Family Communication:  Level of care: Med-Surg Dispo:   The patient is from: home Anticipated d/c is to: SNF  rehab Anticipated d/c date is: whenever bed available.  Patient currently is medically ready to d/c.   Subjective and Interval History:  MRI spine overnight didn't show acute finding to explain pt's weakness or urinary incontinence.  Pt reported headache improved on Imitrex.  Left arm strength improved a little today.    Objective: Vitals:   01/22/22 0740 01/22/22 0746 01/22/22 1116 01/22/22 1607  BP: 131/75 130/64 (!) 141/78 (!) 151/81  Pulse: 70 68 66 76  Resp: 20 20 16 18   Temp: 98.2 F (36.8 C) 98.2 F (36.8 C) 99.4 F (37.4 C) 98.2 F (36.8 C)  TempSrc:  Oral Oral Oral  SpO2: 99% 98% 100% 98%  Weight:      Height:        Intake/Output Summary (Last 24 hours) at 01/22/2022 1849 Last data filed at 01/22/2022 1620 Gross per 24 hour  Intake 360 ml  Output 2000 ml  Net -1640 ml   Filed Weights   01/18/22 1054 01/18/22 1100 01/18/22 1700  Weight: 107.5 kg 107.5 kg 107.6 kg    Examination:   Constitutional: NAD, AAOx3 HEENT: conjunctivae and lids normal, EOMI CV: No cyanosis.   RESP: normal respiratory effort, on RA SKIN: warm, dry Psych: depressed mood and affect.  Appropriate judgement and reason    Data Reviewed: I have personally reviewed following labs and imaging studies  CBC: Recent Labs  Lab 01/18/22 1137 01/19/22 0443 01/20/22 0543 01/21/22 0317 01/22/22 0650  WBC 10.9* 6.4 5.3 9.1 8.8  NEUTROABS 8.9*  --   --   --   --   HGB 9.2* 9.4* 10.3* 9.4* 10.0*  HCT 30.0* 30.8* 32.1* 29.4*  31.7*  MCV 86.5 83.7 83.4 81.9 83.6  PLT 226 236 269 248 366   Basic Metabolic Panel: Recent Labs  Lab 01/18/22 1137 01/19/22 0443 01/20/22 0543 01/21/22 0317 01/22/22 0650  NA 133* 138 140 138 138  K 4.2 3.9 4.0 3.5 3.5  CL 99 101 102 101 100  CO2 27 31 28 29 31   GLUCOSE 106* 97 121* 108* 101*  BUN 24* 21 29* 30* 20  CREATININE 1.41* 1.21* 1.23* 1.40* 1.25*  CALCIUM 8.0* 8.3* 8.9 8.6* 8.7*  MG  --   --  2.1 2.1 1.9  PHOS  --   --  4.1  --   --     GFR: Estimated Creatinine Clearance: 60 mL/min (A) (by C-G formula based on SCr of 1.25 mg/dL (H)). Liver Function Tests: Recent Labs  Lab 01/18/22 1137  AST 14*  ALT 6  ALKPHOS 108  BILITOT 0.3  PROT 6.1*  ALBUMIN 3.2*   No results for input(s): LIPASE, AMYLASE in the last 168 hours. No results for input(s): AMMONIA in the last 168 hours. Coagulation Profile: Recent Labs  Lab 01/18/22 1137  INR 1.0   Cardiac Enzymes: No results for input(s): CKTOTAL, CKMB, CKMBINDEX, TROPONINI in the last 168 hours. BNP (last 3 results) Recent Labs    08/29/21 0931  PROBNP 180.0*   HbA1C: No results for input(s): HGBA1C in the last 72 hours.  CBG: Recent Labs  Lab 01/18/22 1052 01/18/22 1701 01/18/22 2110  GLUCAP 98 76 85   Lipid Profile: No results for input(s): CHOL, HDL, LDLCALC, TRIG, CHOLHDL, LDLDIRECT in the last 72 hours.  Thyroid Function Tests: No results for input(s): TSH, T4TOTAL, FREET4, T3FREE, THYROIDAB in the last 72 hours. Anemia Panel: No results for input(s): VITAMINB12, FOLATE, FERRITIN, TIBC, IRON, RETICCTPCT in the last 72 hours. Sepsis Labs: Recent Labs  Lab 01/18/22 1137  PROCALCITON <0.10    Recent Results (from the past 240 hour(s))  Resp Panel by RT-PCR (Flu A&B, Covid) Nasopharyngeal Swab     Status: None   Collection Time: 01/18/22 12:50 PM   Specimen: Nasopharyngeal Swab; Nasopharyngeal(NP) swabs in vial transport medium  Result Value Ref Range Status   SARS Coronavirus 2 by RT PCR NEGATIVE NEGATIVE Final    Comment: (NOTE) SARS-CoV-2 target nucleic acids are NOT DETECTED.  The SARS-CoV-2 RNA is generally detectable in upper respiratory specimens during the acute phase of infection. The lowest concentration of SARS-CoV-2 viral copies this assay can detect is 138 copies/mL. A negative result does not preclude SARS-Cov-2 infection and should not be used as the sole basis for treatment or other patient management decisions. A negative  result may occur with  improper specimen collection/handling, submission of specimen other than nasopharyngeal swab, presence of viral mutation(s) within the areas targeted by this assay, and inadequate number of viral copies(<138 copies/mL). A negative result must be combined with clinical observations, patient history, and epidemiological information. The expected result is Negative.  Fact Sheet for Patients:  EntrepreneurPulse.com.au  Fact Sheet for Healthcare Providers:  IncredibleEmployment.be  This test is no t yet approved or cleared by the [place] FDA and  has been authorized for detection and/or diagnosis of SARS-CoV-2 by FDA under an Emergency Use Authorization (EUA). This EUA will remain  in effect (meaning this test can be used) for the duration of the COVID-19 declaration under Section 564(b)(1) of the Act, 21 U.S.C.section 360bbb-3(b)(1), unless the authorization is terminated  or revoked sooner.       Influenza  A by PCR NEGATIVE NEGATIVE Final   Influenza B by PCR NEGATIVE NEGATIVE Final    Comment: (NOTE) The Xpert Xpress SARS-CoV-2/FLU/RSV plus assay is intended as an aid in the diagnosis of influenza from Nasopharyngeal swab specimens and should not be used as a sole basis for treatment. Nasal washings and aspirates are unacceptable for Xpert Xpress SARS-CoV-2/FLU/RSV testing.  Fact Sheet for Patients: EntrepreneurPulse.com.au  Fact Sheet for Healthcare Providers: IncredibleEmployment.be  This test is not yet approved or cleared by the [place] FDA and has been authorized for detection and/or diagnosis of SARS-CoV-2 by FDA under an Emergency Use Authorization (EUA). This EUA will remain in effect (meaning this test can be used) for the duration of the COVID-19 declaration under Section 564(b)(1) of the Act, 21 U.S.C. section 360bbb-3(b)(1), unless the authorization is  terminated or revoked.  Performed at [facility], [address] [place], [place] [address]   Urine Culture     Status: Abnormal   Collection Time: 01/18/22 12:58 PM   Specimen: Urine, Clean Catch  Result Value Ref Range Status   Specimen Description   Final    URINE, CLEAN CATCH Performed at [facility], [address] [place], [place] [address]    Special Requests   Final    NONE Performed at [facility], [place], [place] [address]    Culture >=100,000 COLONIES/mL ESCHERICHIA COLI (A)  Final   Report Status 01/21/2022 FINAL  Final   Organism ID, Bacteria ESCHERICHIA COLI (A)  Final      Susceptibility   Escherichia coli - MIC*    AMPICILLIN 8 SENSITIVE Sensitive     CEFAZOLIN <=4 SENSITIVE Sensitive     CEFEPIME <=0.12 SENSITIVE Sensitive     CEFTRIAXONE <=0.25 SENSITIVE Sensitive     CIPROFLOXACIN <=0.25 SENSITIVE Sensitive     GENTAMICIN <=1 SENSITIVE Sensitive     IMIPENEM <=0.25 SENSITIVE Sensitive     NITROFURANTOIN <=16 SENSITIVE Sensitive     TRIMETH/SULFA <=20 SENSITIVE Sensitive     AMPICILLIN/SULBACTAM 4 SENSITIVE Sensitive     PIP/TAZO <=4 SENSITIVE Sensitive     * >=100,000 COLONIES/mL ESCHERICHIA COLI  MRSA Next Gen by PCR, Nasal     Status: None   Collection Time: 01/18/22  4:59 PM   Specimen: Nasal Mucosa; Nasal Swab  Result Value Ref Range Status   MRSA by PCR Next Gen NOT DETECTED NOT DETECTED Final    Comment: (NOTE) The GeneXpert MRSA Assay (FDA approved for NASAL specimens only), is one component of a comprehensive MRSA colonization surveillance program. It is not intended to diagnose MRSA infection nor to guide or monitor treatment for MRSA infections. Test performance is not FDA approved in patients less than 46 years old. Performed at [facility], [address] [place], [place] [address]       Radiology Studies: MR BRAIN W WO CONTRAST  Result Date: 01/20/2022 CLINICAL DATA:   TIA, left-sided weakness and left hemi field cut EXAM: MRI HEAD AND ORBITS WITHOUT AND WITH CONTRAST TECHNIQUE: Multiplanar, multiecho pulse sequences of the brain and surrounding structures were obtained without and with intravenous contrast. Multiplanar, multiecho pulse sequences of the orbits and surrounding structures were obtained including fat saturation techniques, before and after intravenous contrast administration. CONTRAST:  65mL GADAVIST GADOBUTROL 1 MMOL/ML IV SOLN COMPARISON:  01/18/2022 MRI brain FINDINGS: MRI HEAD FINDINGS Evaluation is limited by motion artifact. Brain: No restricted diffusion to suggest acute or subacute infarct. No acute hemorrhage,  mass, mass effect, or midline shift. No hemosiderin deposition to suggest remote hemorrhage. No hydrocephalus or extra-axial collection. Scattered T2 hyperintense signal in the periventricular white matter, likely the sequela of minimal chronic small vessel ischemic disease. No abnormal parenchymal or meningeal enhancement. Vascular: Normal flow voids and opacified vasculature. Skull and upper cervical spine: Normal marrow signal. Other: None. MRI ORBITS FINDINGS Evaluation is limited by motion artifact. Orbits: No traumatic or inflammatory finding. Globes, optic nerves, orbital fat, extraocular muscles, vascular structures, and lacrimal glands are normal. Visualized sinuses: Clear. Soft tissues: Negative. IMPRESSION: Evaluation is limited by motion artifact. Within this limitation, no acute intracranial or orbital process. Electronically Signed   By: Merilyn Baba M.D.   On: 01/20/2022 22:57   MR CERVICAL SPINE W WO CONTRAST  Result Date: 01/22/2022 CLINICAL DATA:  Left upper extremity weakness EXAM: MRI CERVICAL AND THORACIC SPINE WITHOUT AND WITH CONTRAST TECHNIQUE: Multiplanar and multiecho pulse sequences of the cervical spine, to include the craniocervical junction and cervicothoracic junction, and the thoracic spine, were obtained without and  with intravenous contrast. CONTRAST:  26mL GADAVIST GADOBUTROL 1 MMOL/ML IV SOLN COMPARISON:  MRI thoracic spine 08/18/2021, MRI cervical spine 10/05/2017. FINDINGS: MRI CERVICAL SPINE FINDINGS Evaluation is somewhat limited by motion artifact. Alignment: Trace anterolisthesis C3 on C4, unchanged. Vertebrae: No acute fracture or suspicious osseous lesion. No abnormal enhancement. Cord: Normal signal and morphology.  No abnormal enhancement. Posterior Fossa, vertebral arteries, paraspinal tissues: Negative. Disc levels: C2-C3: No significant disc bulge. Mild facet arthropathy. No spinal canal stenosis or neuroforaminal narrowing. C3-C4: Trace anterolisthesis. Left-greater-than-right facet and uncovertebral hypertrophy. No spinal canal stenosis. Moderate left neural foraminal narrowing, unchanged. C4-C5: No significant disc bulge. Facet and uncovertebral hypertrophy. No spinal canal stenosis. Mild left-greater-than-right neural foraminal narrowing. C5-C6: Minimal disc bulge. Facet and uncovertebral hypertrophy. No spinal canal stenosis. Mild bilateral neural foraminal narrowing. C6-C7: No significant disc bulge. Left-greater-than-right facet and uncovertebral hypertrophy. No spinal canal stenosis. Mild left neural foraminal narrowing. C7-T1: No significant disc bulge. No spinal canal stenosis or neuroforaminal narrowing. MRI THORACIC SPINE FINDINGS Alignment:  Physiologic. Vertebrae: Redemonstrated compression deformities of T9 and T10, which are both status post kyphoplasty, without additional vertebral body height loss. Persistent edema is noted in these vertebral bodies, with increased T2 signal and enhancement, although it is decreased compared to the prior exam. No acute fracture. No abnormal enhancement. Cord:  Normal signal and morphology.  No abnormal enhancement. Paraspinal and other soft tissues: Negative. Disc levels: No significant spinal canal stenosis. Small disc bulges, the most prominent of which is a  left subarticular protrusion at T11-T12, unchanged. No significant neural foraminal narrowing. IMPRESSION: 1. No acute fracture in the cervical or thoracic spine. Redemonstrated compression deformities of T9 and T10, status post kyphoplasty, without additional vertebral body height loss but with some persistent edema. 2. No spinal canal stenosis in the cervical or thoracic spine. 3. Multilevel uncovertebral and facet hypertrophy in the cervical spine, which causes moderate left neural foraminal narrowing at C3-C4, mild bilateral neural foraminal narrowing at C4-C5 and C5-C6, and mild left neural foraminal narrowing at C6-C7, which appear overall unchanged compared to 10/05/2017. Electronically Signed   By: Merilyn Baba M.D.   On: 01/22/2022 00:55   MR THORACIC SPINE W WO CONTRAST  Result Date: 01/22/2022 CLINICAL DATA:  Left upper extremity weakness EXAM: MRI CERVICAL AND THORACIC SPINE WITHOUT AND WITH CONTRAST TECHNIQUE: Multiplanar and multiecho pulse sequences of the cervical spine, to include the craniocervical junction and cervicothoracic junction, and the  thoracic spine, were obtained without and with intravenous contrast. CONTRAST:  43mL GADAVIST GADOBUTROL 1 MMOL/ML IV SOLN COMPARISON:  MRI thoracic spine 08/18/2021, MRI cervical spine 10/05/2017. FINDINGS: MRI CERVICAL SPINE FINDINGS Evaluation is somewhat limited by motion artifact. Alignment: Trace anterolisthesis C3 on C4, unchanged. Vertebrae: No acute fracture or suspicious osseous lesion. No abnormal enhancement. Cord: Normal signal and morphology.  No abnormal enhancement. Posterior Fossa, vertebral arteries, paraspinal tissues: Negative. Disc levels: C2-C3: No significant disc bulge. Mild facet arthropathy. No spinal canal stenosis or neuroforaminal narrowing. C3-C4: Trace anterolisthesis. Left-greater-than-right facet and uncovertebral hypertrophy. No spinal canal stenosis. Moderate left neural foraminal narrowing, unchanged. C4-C5: No  significant disc bulge. Facet and uncovertebral hypertrophy. No spinal canal stenosis. Mild left-greater-than-right neural foraminal narrowing. C5-C6: Minimal disc bulge. Facet and uncovertebral hypertrophy. No spinal canal stenosis. Mild bilateral neural foraminal narrowing. C6-C7: No significant disc bulge. Left-greater-than-right facet and uncovertebral hypertrophy. No spinal canal stenosis. Mild left neural foraminal narrowing. C7-T1: No significant disc bulge. No spinal canal stenosis or neuroforaminal narrowing. MRI THORACIC SPINE FINDINGS Alignment:  Physiologic. Vertebrae: Redemonstrated compression deformities of T9 and T10, which are both status post kyphoplasty, without additional vertebral body height loss. Persistent edema is noted in these vertebral bodies, with increased T2 signal and enhancement, although it is decreased compared to the prior exam. No acute fracture. No abnormal enhancement. Cord:  Normal signal and morphology.  No abnormal enhancement. Paraspinal and other soft tissues: Negative. Disc levels: No significant spinal canal stenosis. Small disc bulges, the most prominent of which is a left subarticular protrusion at T11-T12, unchanged. No significant neural foraminal narrowing. IMPRESSION: 1. No acute fracture in the cervical or thoracic spine. Redemonstrated compression deformities of T9 and T10, status post kyphoplasty, without additional vertebral body height loss but with some persistent edema. 2. No spinal canal stenosis in the cervical or thoracic spine. 3. Multilevel uncovertebral and facet hypertrophy in the cervical spine, which causes moderate left neural foraminal narrowing at C3-C4, mild bilateral neural foraminal narrowing at C4-C5 and C5-C6, and mild left neural foraminal narrowing at C6-C7, which appear overall unchanged compared to 10/05/2017. Electronically Signed   By: Merilyn Baba M.D.   On: 01/22/2022 00:55   MR ORBITS W WO CONTRAST  Result Date:  01/20/2022 CLINICAL DATA:  TIA, left-sided weakness and left hemi field cut EXAM: MRI HEAD AND ORBITS WITHOUT AND WITH CONTRAST TECHNIQUE: Multiplanar, multiecho pulse sequences of the brain and surrounding structures were obtained without and with intravenous contrast. Multiplanar, multiecho pulse sequences of the orbits and surrounding structures were obtained including fat saturation techniques, before and after intravenous contrast administration. CONTRAST:  39mL GADAVIST GADOBUTROL 1 MMOL/ML IV SOLN COMPARISON:  01/18/2022 MRI brain FINDINGS: MRI HEAD FINDINGS Evaluation is limited by motion artifact. Brain: No restricted diffusion to suggest acute or subacute infarct. No acute hemorrhage, mass, mass effect, or midline shift. No hemosiderin deposition to suggest remote hemorrhage. No hydrocephalus or extra-axial collection. Scattered T2 hyperintense signal in the periventricular white matter, likely the sequela of minimal chronic small vessel ischemic disease. No abnormal parenchymal or meningeal enhancement. Vascular: Normal flow voids and opacified vasculature. Skull and upper cervical spine: Normal marrow signal. Other: None. MRI ORBITS FINDINGS Evaluation is limited by motion artifact. Orbits: No traumatic or inflammatory finding. Globes, optic nerves, orbital fat, extraocular muscles, vascular structures, and lacrimal glands are normal. Visualized sinuses: Clear. Soft tissues: Negative. IMPRESSION: Evaluation is limited by motion artifact. Within this limitation, no acute intracranial or orbital process. Electronically Signed  By: Merilyn Baba M.D.   On: 01/20/2022 22:57     Scheduled Meds:   stroke: mapping our early stages of recovery book   Does not apply Once   aspirin  81 mg Oral Daily   Chlorhexidine Gluconate Cloth  6 each Topical Q0600   enoxaparin (LOVENOX) injection  40 mg Subcutaneous Q24H   pantoprazole (PROTONIX) IV  40 mg Intravenous QHS   Continuous Infusions:     LOS: 4 days      Enzo Bi, MD [facility] If 7PM-7AM, please contact night-coverage 01/22/2022, 6:49 PM

## 2022-01-22 NOTE — Progress Notes (Signed)
OT Cancellation Note  Patient Details Name: Morgan [name] MRN: [id number] DOB: 05-03-1951   Cancelled Treatment:    Reason Eval/Treat Not Completed: Medical issues which prohibited therapy. Upon attempt, pt finishing up with nursing student for care. With [facility] elevated minimally, pt becomes more nauseated. Endorses 5/10 headache. RN notified. Will re-attempt OT tx at later time as pt is more able to tolerate.   Ardeth Perfect., MPH, MS, OTR/L ascom [phone] 01/22/22, 10:03 AM

## 2022-01-22 NOTE — TOC Progression Note (Addendum)
Transition of Care [facility]) - Progression Note    Patient Details  Name: Morgan [name] MRN: [id number] Date of Birth: 1951-12-12  Transition of Care [facility]) CM/SW [place], RN Phone Number: 01/22/2022, 10:54 AM  Clinical Narrative:   No bed offers as of yet.  [facility] and [facility] pending.  RNCM reached out to both, awaiting response.  Patient prefers [facility], but they are not in network as per [place] in admissions.  TOC to follow.  Addendum:  1249pm, [facility] has offered a bed, patient amenable to transfer to [facility].  Neoma Laming to get [place].  Addendum 1339 hours:  patient stated she did not want to go to [facility].  [facility] offered bed, patient declined, wanted bed search extended to [facility].  Request sent.  Debbra Riding aware  Expected Discharge Plan: IP Rehab Facility Barriers to Discharge: Continued Medical Work up  Expected Discharge Plan and Services Expected Discharge Plan: [place]   Discharge Planning Services: CM Consult   Living arrangements for the past 2 months: Single Family Home                 DME Arranged: N/A DME Agency: NA                   Social Determinants of Health (SDOH) Interventions    Readmission Risk Interventions No flowsheet data found.

## 2022-01-22 NOTE — Progress Notes (Addendum)
Physical Therapy Treatment Patient Details Name: Morgan [name] MRN: [id number] DOB: [birth year] Today's Date: 01/22/2022   History of Present Illness Pt is a 71 y/o F who presented to the ED with c/o syncope, L sided weakness/heaviness, & L sided facial droop. Code stroke was called & pt received TNK at 1119; pt was admitted to the ICU for further work up & monitoring. Initial MRI was negative for acute processes. PMH: CHF, HTN, mitral valve prolapse, e. 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  Reports feeling about the same as yesterday.  Main c/o is continued weakness.  Session focused on supine ex x 10 for LE and core strength.  She is cued to take frequent rest breaks as nausea increased with activity and she does dry heave or spit frequently during session but wants to keep working through it "I won't get stronger if I don't."  She remains highly motivated but continues to be quite limitied by nausea and dizziness.  Nursing students in at end of session for bathing.  Further mobility and sitting limited by malaise. Educated and encouraged HEP during the day when she feels up to it.   Recommendations for follow up therapy are one component of a multi-disciplinary discharge planning process, led by the attending physician.  Recommendations may be updated based on patient status, additional functional criteria and insurance authorization.  Follow Up Recommendations  Skilled nursing-short term rehab (<3 hours/day)     Assistance Recommended at Discharge Frequent or constant Supervision/Assistance  Patient can return home with the following A lot of help with walking and/or transfers;A lot of help with bathing/dressing/bathroom;Assist for transportation;Assistance with cooking/housework;Direct supervision/assist for financial management;Help with stairs or ramp for entrance;Direct supervision/assist for medications management   Equipment  Recommendations  None recommended by PT    Recommendations for Other Services       Precautions / Restrictions Precautions Precautions: Fall     Mobility  Bed Mobility               General bed mobility comments: deferred due to nausea and dizziness    Transfers                        Ambulation/Gait                   Stairs             Wheelchair Mobility    Modified Rankin (Stroke Patients Only)       Balance                                            Cognition Arousal/Alertness: Awake/alert Behavior During Therapy: WFL for tasks assessed/performed Overall Cognitive Status: Within Functional Limits for tasks assessed                                 General Comments: PT  was A and oriented and very pleasant throughout.        Exercises Other Exercises Other Exercises: supine AROM x 10 with frequent and long rest breaks as needed    General Comments        Pertinent Vitals/Pain Pain Assessment Pain Assessment: Faces Faces Pain Scale: Hurts  a little bit Pain Location: HA improved Pain Descriptors / Indicators: Grimacing Pain Intervention(s): Limited activity within patient's tolerance, Monitored during session, Utilized relaxation techniques    Home Living                          Prior Function            PT Goals (current goals can now be found in the care plan section) Progress towards PT goals: Progressing toward goals    Frequency    7X/week      PT Plan Current plan remains appropriate    Co-evaluation              AM-PAC PT "6 Clicks" Mobility   Outcome Measure  Help needed turning from your back to your side while in a flat bed without using bedrails?: A Lot Help needed moving from lying on your back to sitting on the side of a flat bed without using bedrails?: A Lot Help needed moving to and from a bed to a chair (including a wheelchair)?:  Total Help needed standing up from a chair using your arms (e.g., wheelchair or bedside chair)?: Total Help needed to walk in hospital room?: Total Help needed climbing 3-5 steps with a railing? : Total 6 Click Score: 8    End of Session   Activity Tolerance: Treatment limited secondary to medical complications (Comment) Patient left: in bed;with bed alarm set;with call bell/phone within reach Nurse Communication: Mobility status PT Visit Diagnosis: Difficulty in walking, not elsewhere classified (R26.2);Muscle weakness (generalized) (M62.81);Hemiplegia and hemiparesis Hemiplegia - Right/Left: Left Hemiplegia - dominant/non-dominant: Dominant Hemiplegia - caused by: Unspecified     Time: 3903-0092 PT Time Calculation (min) (ACUTE ONLY): 23 min  Charges:  $Therapeutic Exercise: 23-37 mins                    Chesley Noon, PTA 01/22/22, 10:11 AM

## 2022-01-22 NOTE — Progress Notes (Signed)
Occupational Therapy Treatment Patient Details Name: Morgan [name] MRN: [id number] DOB: [birth year] Today's Date: 01/22/2022   History of present illness Pt is a 71 y/o F who presented to the ED with c/o syncope, L sided weakness/heaviness, & L sided facial droop. Code stroke was called & pt received TNK at 1119; pt was admitted to the ICU for further work up & monitoring. Initial MRI was negative for acute processes. PMH: CHF, HTN, mitral valve prolapse, e. 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 No nystagmus notified. Pt unable to tolerate remaining in chair position in bed 2/2 nausea. RN notified. Pt educated in difference between varying therapy options at discharge and why therapy was recommending SNF at this time. Pt asking what it would take to return home. OT facilitated problem solving for home set up and assist/DME needed if she were to recline to go to rehab and insisted upon going home. Pt reports having a rollator, built-in shower chair for walk-in shower, w/c and reports having family who could be with her 24/7 at home and be able to assist with toileting, bathing, dressing, meals, and medications. TOC notified. Pt also educated in strategies to support urinary incontinence mgt at home. Pt verbalized understanding. Pt continues to benefit from skilled OT services. Continue to recommend short term rehab as most appropriate discharge for higher level of therapy. Discharge recommendation updated to SNF.   Recommendations for follow up therapy are one component of a multi-disciplinary discharge planning process, led by the attending physician.  Recommendations may be updated based on patient status, additional functional criteria and insurance authorization.    Follow Up Recommendations  Skilled nursing-short term rehab (<3 hours/day)    Assistance Recommended at Discharge Intermittent  Supervision/Assistance  Patient can return home with the following  A lot of help with walking and/or transfers;A lot of help with bathing/dressing/bathroom;Direct supervision/assist for medications management;Direct supervision/assist for financial management;Assistance with cooking/housework;Assist for transportation;Help with stairs or ramp for entrance   Equipment Recommendations  BSC/3in1;Other (comment) (2WW)    Recommendations for Other Services      Precautions / Restrictions Precautions Precautions: Fall Restrictions Weight Bearing Restrictions: No       Mobility Bed Mobility               General bed mobility comments: bed positioning slowly adjusted to attempt to minimize onset of nausea/whooziness, unfortunately unsuccessful    Transfers                         Balance                                           ADL either performed or assessed with clinical judgement   ADL       Grooming: Bed level;Set up Grooming Details (indicate cue type and reason): sitting position in bed, set up                                    Extremity/Trunk Assessment              Vision       Perception     Praxis  Cognition Arousal/Alertness: Awake/alert Behavior During Therapy: WFL for tasks assessed/performed Overall Cognitive Status: Within Functional Limits for tasks assessed                                 General Comments: pt motivated to try        Exercises Other Exercises Other Exercises: Pt educated in difference between varying therapy options at discharge and why therapy was recommending SNF at this time. Pt asking what it would take to return home. OT facilitated problem solving for home set up and assist/DME needed if she were to recline to go to rehab and insisted upon going home.  Pt has a rollator, built in shower chair for walk-in shower, w/c and reports having family who could  be with her 24/7 at home.TOC notified. Other Exercises: Pt educated in strategies to support urinary incontinence mgt at home    Shoulder Instructions       General Comments      Pertinent Vitals/ Pain       Pain Assessment Pain Assessment: 0-10 Pain Score: 4  Pain Location: headache Pain Descriptors / Indicators: Headache Pain Intervention(s): Limited activity within patient's tolerance, Monitored during session, Premedicated before session, Repositioned, Other (comment) (RN notified)  Home Living                                          Prior Functioning/Environment              Frequency  Min 2X/week        Progress Toward Goals  OT Goals(current goals can now be found in the care plan section)  Progress towards OT goals: Not progressing toward goals - comment (nausea/vomiting with positional changes)  Acute Rehab OT Goals Patient Stated Goal: be independent OT Goal Formulation: With patient Time For Goal Achievement: 02/02/22 Potential to Achieve Goals: Good  Plan Discharge plan needs to be updated;Frequency needs to be updated    Co-evaluation                 AM-PAC OT "6 Clicks" Daily Activity     Outcome Measure   Help from another person eating meals?: A Little Help from another person taking care of personal grooming?: A Little Help from another person toileting, which includes using toliet, bedpan, or urinal?: A Lot Help from another person bathing (including washing, rinsing, drying)?: A Lot Help from another person to put on and taking off regular upper body clothing?: A Lot Help from another person to put on and taking off regular lower body clothing?: A Lot 6 Click Score: 14    End of Session    OT Visit Diagnosis: Other abnormalities of gait and mobility (R26.89);Hemiplegia and hemiparesis Hemiplegia - Right/Left: Left Hemiplegia - dominant/non-dominant: Dominant Hemiplegia - caused by: Unspecified   Activity  Tolerance Treatment limited secondary to medical complications (Comment) (n/v)   Patient Left in bed;with call bell/phone within reach;with nursing/sitter in room   Nurse Communication Other (comment) (n/v)        Time: 0037-0488 OT Time Calculation (min): 20 min  Charges: OT General Charges $OT Visit: 1 Visit OT Treatments $Therapeutic Activity: 8-22 mins  Ardeth Perfect., MPH, MS, OTR/L ascom [phone] 01/22/22, 3:53 PM

## 2022-01-22 NOTE — Progress Notes (Signed)
NEUROLOGY PROGRESS NOTE  S: Reports off-and-on headache. Some improvement this morning but reports that improvements in headaches are transient. Continues to have left-sided deficits.  MRI brain and orbits wwo contrast no acute findings.  MRI C-spine and T-spine done overnight-no acute changes.-See detailed report below.  TTE: grade I disatolic dysfunction, no other sig abnl, no intrcardiac clot  O:  Today's Vitals   01/22/22 0036 01/22/22 0513 01/22/22 0740 01/22/22 0746  BP:  (!) 144/74 131/75 130/64  Pulse:  70 70 68  Resp:  17 20 20   Temp:  98 F (36.7 C) 98.2 F (36.8 C) 98.2 F (36.8 C)  TempSrc:  Oral    SpO2:  98% 99% 98%  Weight:      Height:      PainSc: Asleep      Body mass index is 31.3 kg/m.  Physical Exam Gen: alert, oriented to self, [facility], and year but not month HEENT: Atraumatic, normocephalic;mucous membranes moist; oropharynx clear, tongue without atrophy or fasciculations. Neck: Supple, trachea midline. Resp: CTAB, no w/r/r CV: RRR, no m/g/r; nml S1 and S2. 2+ symmetric peripheral pulses. Abd: soft/NT/ND; nabs x 4 quad Extrem: Nml bulk; no cyanosis, clubbing, or edema.   Neuro: *MS: Awake alert oriented x3 *Speech: Fluent, nondysarthric, able to name and repeat *CN: Pupils are equal round reactive light, extraocular movements unhindered, visual fields are full, facial sensation is diminished on the left face with a sharp cut off in the midline even to vibration on the forehead, left lower facial weakness-somewhat volitional, hearing intact, tongue and palate midline. *Motor:   Normal bulk.  Action tremor as well as resting tremor in the left upper extremity with drift in LUE and LLE but not to bed. RUE and RLE full strength.  *Sensory: Impaired to LT LUE and LLE. No double-simultaneous extinction.  *Coordination:  FNF intact on R *Reflexes:  2+ and symmetric throughout without clonus; toes down-going bilat *Gait: deferred   NIHSS  1a Level of  Conscious.: 0 1b LOC Questions: 0 1c LOC Commands: 0 2 Best Gaze: 0 3 Visual: 0 4 Facial Palsy: 1 5a Motor Arm - left: 1 5b Motor Arm - Right: 0 6a Motor Leg - Left: 1 6b Motor Leg - Right: 0 7 Limb Ataxia: 0 8 Sensory: 1 9 Best Language: 0 10 Dysarthria: 0 11 Extinct. and Inatten.: 0   TOTAL: 4   Labs CBC Latest Ref Rng & Units 01/22/2022 01/21/2022 01/20/2022  WBC 4.0 - 10.5 K/uL 8.8 9.1 5.3  Hemoglobin 12.0 - 15.0 g/dL 10.0(L) 9.4(L) 10.3(L)  Hematocrit 36.0 - 46.0 % 31.7(L) 29.4(L) 32.1(L)  Platelets 150 - 400 K/uL 257 248 269   BMP Latest Ref Rng & Units 01/22/2022 01/21/2022 01/20/2022  Glucose 70 - 99 mg/dL 101(H) 108(H) 121(H)  BUN 8 - 23 mg/dL 20 30(H) 29(H)  Creatinine 0.44 - 1.00 mg/dL 1.25(H) 1.40(H) 1.23(H)  Sodium 135 - 145 mmol/L 138 138 140  Potassium 3.5 - 5.1 mmol/L 3.5 3.5 4.0  Chloride 98 - 111 mmol/L 100 101 102  CO2 22 - 32 mmol/L 31 29 28   Calcium 8.9 - 10.3 mg/dL 8.7(L) 8.6(L) 8.9   A1c - 5.2 (at goal) LDL - 63 (at goal) -not on statin at home 2d echo IMPRESSIONS   1. Left ventricular ejection fraction, by estimation, is 60 to 65%. The  left ventricle has normal function. The left ventricle has no regional  wall motion abnormalities. Left ventricular diastolic parameters are  consistent with Grade  I diastolic  dysfunction (impaired relaxation).   2. Right ventricular systolic function is normal. The right ventricular  size is normal.   3. The mitral valve is normal in structure. Trivial mitral valve  regurgitation. No evidence of mitral stenosis.   4. The aortic valve is normal in structure. Aortic valve regurgitation is  not visualized. No aortic stenosis is present.   5. The inferior vena cava is normal in size with <50% respiratory  variability, suggesting right atrial pressure of 8 mmHg.  A/P:  71 year old woman with history of Parkinson's disease, hypertension, CHF who presented to the emergency department after developing vertigo and left-sided  weakness at a doctor's appointment 2/2 and received TNK during stroke code. She continues to have persistent deficits and migrainous headache despite negative MRI.  I assumed the patient's care from Dr. Quinn Axe, who had been following her and has diligently performed an extensive work-up for her strokelike symptoms to include a stroke risk factor work-up as well as MRIs of the brain, orbit, C-spine and T-spine all of which are unremarkable for any acute process. Her examination has a functional overlay on it. 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 She is status post TNK as she presented with acute left-sided weakness, which I feel also in hindsight was part of the functional neurological presentation.  - Continue ASA 81mg  daily - No need for statin - not a stroke - Avoid ergots 2/2 vasoconstriction - Avoid compazine, phenergan, and reglan in the setting of Parkinson's disease - Can be given outpatient prescription of Sumatriptan 50mg  to be taken right at the onset of headache. Can take one dose as above at the onset of headache and repeat another dose in 2 hours if no relief. NO MORE THAN 200mg /day.  -- Multiple meds for headache tried inpatient, with intermittent relief. Will benefit from outpatient neurology follow up for headache evaluation and possible preventive treatment.  No further inpatient neurology w/u at this time.   Plan relayed to the hospitalist via secure chat.  Please call neurology with questions as needed.  -- Amie Portland, MD Neurologist [facility] Pager: [phone]

## 2022-01-23 LAB — MAGNESIUM: Magnesium: 1.9 mg/dL (ref 1.7–2.4)

## 2022-01-23 LAB — BASIC METABOLIC PANEL
Anion gap: 7 (ref 5–15)
BUN: 20 mg/dL (ref 8–23)
CO2: 32 mmol/L (ref 22–32)
Calcium: 8.9 mg/dL (ref 8.9–10.3)
Chloride: 100 mmol/L (ref 98–111)
Creatinine, Ser: 1.17 mg/dL — ABNORMAL HIGH (ref 0.44–1.00)
GFR, Estimated: 51 mL/min — ABNORMAL LOW (ref >60)
Glucose, Bld: 105 mg/dL — ABNORMAL HIGH (ref 70–99)
Potassium: 3.4 mmol/L — ABNORMAL LOW (ref 3.5–5.1)
Sodium: 139 mmol/L (ref 135–145)

## 2022-01-23 LAB — CBC
HCT: 33.1 % — ABNORMAL LOW (ref 36.0–46.0)
Hemoglobin: 10.2 g/dL — ABNORMAL LOW (ref 12.0–15.0)
MCH: 26.2 pg (ref 26.0–34.0)
MCHC: 30.8 g/dL (ref 30.0–36.0)
MCV: 85.1 fL (ref 80.0–100.0)
Platelets: 250 10*3/uL (ref 150–400)
RBC: 3.89 MIL/uL (ref 3.87–5.11)
RDW: 14.5 % (ref 11.5–15.5)
WBC: 9.9 10*3/uL (ref 4.0–10.5)
nRBC: 0 % (ref 0.0–0.2)

## 2022-01-23 MED ORDER — POTASSIUM CHLORIDE CRYS ER 20 MEQ PO TBCR: 40.0000 meq | EXTENDED_RELEASE_TABLET | Freq: Once | ORAL | Status: DC

## 2022-01-23 MED ORDER — ONDANSETRON 4 MG PO TBDP
4.0000 mg | ORAL_TABLET | Freq: Three times a day (TID) | ORAL | Status: DC | PRN
  Administered 2022-01-28 – 2022-01-30 (×5): 4 mg via ORAL
  Filled 2022-01-23 (×6): qty 1

## 2022-01-23 NOTE — Progress Notes (Signed)
OT Cancellation Note  Patient Details Name: Morgan [name] MRN: [id number] DOB: 1951-08-10   Cancelled Treatment:    Reason Eval/Treat Not Completed: Patient declined, no reason specified. Upon attempt, pt reports having sat up in bed for most of the morning and had just reclined the HOB to rest just prior to OT's arrival. She requested OT re-attempt at later time to allow time for rest. Pt did report that she was able to tolerate sitting up a bit better today with less severe nausea.   Ardeth Perfect., MPH, MS, OTR/L ascom [phone] 01/23/22, 10:32 AM

## 2022-01-23 NOTE — Progress Notes (Signed)
Occupational Therapy Treatment Patient Details Name: Morgan [name] MRN: [id number] DOB: 04/04/1951 Today's Date: 01/23/2022   History of present illness Pt is a 71 y/o F who presented to the ED with c/o syncope, L sided weakness/heaviness, & L sided facial droop. Code stroke was called & pt received TNK at 1119; pt was admitted to the ICU for further work up & monitoring. Initial MRI was negative for acute processes. PMH: CHF, HTN, mitral valve prolapse, e. 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 Pt received in bed, endorsing continued headache and nausea but very motivated to improve in order to return home more quickly. Pt required MIN +1 for bed mobility with VC for sequencing and use of bed rail to improve technique. Pt tolerated sitting EOB with supervision and UE vs no UE support on the side of the bed. Pt reported that nausea remained but at least wasn't worse with sitting. Pt required MINA +2 to stand and CGA+2 for a few lateral steps EOB with assist for RW mgt and VC for sequencing and to keep eyes open. Once seated, pt set up with comb and was able to comb her hair using her LUE  with brief rest breaks 2/2 nausea, notable intention tremor with the effort. Pt left EOB with PT for additional therapy. Pt progressing towards goals. Continue to recommend SNF for rehab at this time.    Recommendations for follow up therapy are one component of a multi-disciplinary discharge planning process, led by the attending physician.  Recommendations may be updated based on patient status, additional functional criteria and insurance authorization.    Follow Up Recommendations  Skilled nursing-short term rehab (<3 hours/day)    Assistance Recommended at Discharge Intermittent Supervision/Assistance  Patient can return home with the following  A lot of help with walking and/or transfers;A lot of help with  bathing/dressing/bathroom;Direct supervision/assist for medications management;Direct supervision/assist for financial management;Assistance with cooking/housework;Assist for transportation;Help with stairs or ramp for entrance   Equipment Recommendations  BSC/3in1;Other (comment) (2WW)    Recommendations for Other Services      Precautions / Restrictions Precautions Precautions: Fall Restrictions Weight Bearing Restrictions: No       Mobility Bed Mobility Overal bed mobility: Needs Assistance Bed Mobility: Supine to Sit     Supine to sit: Min assist, HOB elevated     General bed mobility comments: VC hand placement, bed rail use    Transfers Overall transfer level: Needs assistance Equipment used: Rolling walker (2 wheels) Transfers: Sit to/from Stand Sit to Stand: Min assist, +2 physical assistance           General transfer comment: MIN A +2 to stand, CGA +2 lateral steps + assist to reposition RW, VC to keep eyes open, RW sequencing and taking larger steps     Balance Overall balance assessment: Needs assistance Sitting-balance support: Feet supported, Single extremity supported, No upper extremity supported Sitting balance-Leahy Scale: Fair     Standing balance support: Bilateral upper extremity supported, Reliant on assistive device for balance Standing balance-Leahy Scale: Poor Standing balance comment: reliant on RW for support, complicated by nausea                           ADL either performed or assessed with clinical judgement   ADL Overall ADL's : Needs assistance/impaired  Grooming: Sitting;Set up;Supervision/safety;Brushing hair Grooming Details (indicate cue type and reason): +time/effort, need to take brief rest breaks 2/2 nausea, seated EOB, RUE support on the bed rail, notable intention tremor in LUE with the effort                             Functional mobility during ADLs: Rolling walker (2 wheels);Cueing for  sequencing;Cueing for safety;Minimal assistance;+2 for physical assistance      Extremity/Trunk Assessment              Vision Patient Visual Report: Peripheral vision impairment Additional Comments: pt still complains of L eye blurriness and impaired L visual field   Perception     Praxis      Cognition Arousal/Alertness: Awake/alert Behavior During Therapy: WFL for tasks assessed/performed Overall Cognitive Status: Within Functional Limits for tasks assessed                                          Exercises      Shoulder Instructions       General Comments      Pertinent Vitals/ Pain       Pain Assessment Pain Assessment: 0-10 Pain Score: 7  Pain Location: headache, L temple Pain Descriptors / Indicators: Headache Pain Intervention(s): Limited activity within patient's tolerance, Monitored during session, Repositioned, Premedicated before session  Home Living                                          Prior Functioning/Environment              Frequency  Min 2X/week        Progress Toward Goals  OT Goals(current goals can now be found in the care plan section)  Progress towards OT goals: Progressing toward goals  Acute Rehab OT Goals Patient Stated Goal: be independent OT Goal Formulation: With patient Time For Goal Achievement: 02/02/22 Potential to Achieve Goals: Good  Plan Discharge plan needs to be updated;Frequency needs to be updated    Co-evaluation    PT/OT/SLP Co-Evaluation/Treatment: Yes Reason for Co-Treatment: Complexity of the patient's impairments (multi-system involvement);For patient/therapist safety;To address functional/ADL transfers PT goals addressed during session: Mobility/safety with mobility;Balance;Proper use of DME OT goals addressed during session: ADL's and self-care;Proper use of Adaptive equipment and DME      AM-PAC OT "6 Clicks" Daily Activity     Outcome Measure    Help from another person eating meals?: A Little Help from another person taking care of personal grooming?: A Little Help from another person toileting, which includes using toliet, bedpan, or urinal?: A Lot Help from another person bathing (including washing, rinsing, drying)?: A Lot Help from another person to put on and taking off regular upper body clothing?: A Lot Help from another person to put on and taking off regular lower body clothing?: A Lot 6 Click Score: 14    End of Session Equipment Utilized During Treatment: Rolling walker (2 wheels)  OT Visit Diagnosis: Other abnormalities of gait and mobility (R26.89);Hemiplegia and hemiparesis Hemiplegia - Right/Left: Left Hemiplegia - dominant/non-dominant: Dominant Hemiplegia - caused by: Unspecified   Activity Tolerance Treatment limited secondary to medical complications (Comment) (continues to be limited by nausea)   Patient Left  in bed;Other (comment);with call bell/phone within reach (seated EOB with PT)   Nurse Communication Other (comment);Patient requests pain meds (headache and nausea)        Time: 3700-5259 OT Time Calculation (min): 18 min  Charges: OT General Charges $OT Visit: 1 Visit OT Treatments $Self Care/Home Management : 8-22 mins  Ardeth Perfect., MPH, MS, OTR/L ascom [phone] 01/23/22, 3:28 PM

## 2022-01-23 NOTE — Progress Notes (Signed)
Physical Therapy Treatment Patient Details Name: Morgan [name] MRN: [id number] DOB: 1951-03-18 Today's Date: 01/23/2022   History of Present Illness Pt is a 71 y/o F who presented to the ED with c/o syncope, L sided weakness/heaviness, & L sided facial droop. Code stroke was called & pt received TNK at 1119; pt was admitted to the ICU for further work up & monitoring. Initial MRI was negative for acute processes. PMH: CHF, HTN, mitral valve prolapse, e. 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 Co-tx with OT performed due to pt limited tolerance 2/2 nausea and to attempt higher level functional mobility such as standing and ambulation. Pt made great progress this date. She remained sitting upright for >15 minutes, performed STS from elevated EOB and took lateral steps at EOB - all of which she was unable to complete in previous sessions. PT would like to assess forward ambulation - +2 will be required for chair follow. Education provided to encourage increased use of L extremities throughout the day. Continuing to recommend SNF at d/c for increased amount of therapy. Would benefit from skilled PT to address above deficits and promote optimal return to PLOF.    Recommendations for follow up therapy are one component of a multi-disciplinary discharge planning process, led by the attending physician.  Recommendations may be updated based on patient status, additional functional criteria and insurance authorization.  Follow Up Recommendations  Skilled nursing-short term rehab (<3 hours/day)     Assistance Recommended at Discharge Frequent or constant Supervision/Assistance  Patient can return home with the following A lot of help with walking and/or transfers;A lot of help with bathing/dressing/bathroom;Assist for transportation;Assistance with cooking/housework;Direct supervision/assist for  financial management;Help with stairs or ramp for entrance;Direct supervision/assist for medications management   Equipment Recommendations  None recommended by PT    Recommendations for Other Services       Precautions / Restrictions Precautions Precautions: Fall Restrictions Weight Bearing Restrictions: No     Mobility  Bed Mobility Overal bed mobility: Needs Assistance Bed Mobility: Supine to Sit     Supine to sit: Min assist, HOB elevated Sit to supine: Min guard   General bed mobility comments: VC hand placement, bed rail use. Encouraged to actively participate with LUE. CGA for head safety returning to bed as pt had difficulty turning body (to esure head did not hit bed rail)    Transfers Overall transfer level: Needs assistance Equipment used: Rolling walker (2 wheels) Transfers: Sit to/from Stand Sit to Stand: Min assist, +2 physical assistance           General transfer comment: MIN A +2 to stand, heavu steadying.    Ambulation/Gait Ambulation/Gait assistance: Min guard, +2 physical assistance, +2 safety/equipment Gait Distance (Feet): 2 Feet Assistive device: Rolling walker (2 wheels)         General Gait Details: CGA +2 to take lateral steps from FOB to [facility]. Assist to manage RW; VC to keep eyes open, RW sequencing and taking larger steps. LLE foot clearance improved with steps.   Stairs             Wheelchair Mobility    Modified Rankin (Stroke Patients Only)       Balance Overall balance assessment: Needs assistance Sitting-balance support: Feet supported, Single extremity supported, No upper extremity supported Sitting balance-Leahy Scale: Fair Sitting balance - Comments: Fair sitting balance EOB;  no LOB. Remained sitting for >15 minutes. Performed activities such as hair hygiene (w/ OT) and LE therex.   Standing balance support: Bilateral upper extremity supported, Reliant on assistive device for balance Standing balance-Leahy  Scale: Poor Standing balance comment: reliant on RW for support, CGA+2 provided; complicated by nausea                            Cognition Arousal/Alertness: Awake/alert Behavior During Therapy: WFL for tasks assessed/performed Overall Cognitive Status: Within Functional Limits for tasks assessed                                          Exercises General Exercises - Lower Extremity Long Arc Quad: AROM, Strengthening, Both, 10 reps, Seated Hip Flexion/Marching: AROM, Strengthening, Seated, Both, 10 reps    General Comments        Pertinent Vitals/Pain Pain Assessment Pain Assessment: 0-10 Pain Score: 7  Pain Location: headache, L temple Pain Descriptors / Indicators: Headache Pain Intervention(s): Limited activity within patient's tolerance, Monitored during session, Premedicated before session    Home Living                          Prior Function            PT Goals (current goals can now be found in the care plan section) Acute Rehab PT Goals Patient Stated Goal: get better PT Goal Formulation: With patient Time For Goal Achievement: 02/02/22 Potential to Achieve Goals: Fair    Frequency    7X/week      PT Plan      Co-evaluation PT/OT/SLP Co-Evaluation/Treatment: Yes Reason for Co-Treatment: Complexity of the patient's impairments (multi-system involvement);For patient/therapist safety;To address functional/ADL transfers PT goals addressed during session: Mobility/safety with mobility;Balance;Proper use of DME OT goals addressed during session: ADL's and self-care;Proper use of Adaptive equipment and DME      AM-PAC PT "6 Clicks" Mobility   Outcome Measure  Help needed turning from your back to your side while in a flat bed without using bedrails?: A Little Help needed moving from lying on your back to sitting on the side of a flat bed without using bedrails?: A Little Help needed moving to and from a bed to a  chair (including a wheelchair)?: A Lot Help needed standing up from a chair using your arms (e.g., wheelchair or bedside chair)?: A Lot Help needed to walk in hospital room?: A Lot Help needed climbing 3-5 steps with a railing? : Total 6 Click Score: 13    End of Session Equipment Utilized During Treatment: Oxygen Activity Tolerance: Treatment limited secondary to medical complications (Comment) Patient left: in bed;with bed alarm set;with call bell/phone within reach Nurse Communication: Mobility status PT Visit Diagnosis: Difficulty in walking, not elsewhere classified (R26.2);Muscle weakness (generalized) (M62.81);Hemiplegia and hemiparesis Hemiplegia - Right/Left: Left Hemiplegia - dominant/non-dominant: Dominant Hemiplegia - caused by: Unspecified     Time: 0762-2633 PT Time Calculation (min) (ACUTE ONLY): 26 min  Charges:  $Therapeutic Exercise: 8-22 mins                     Patrina Levering PT, DPT 01/23/22 3:54 PM [phone]

## 2022-01-23 NOTE — Progress Notes (Signed)
PT Cancellation Note  Patient Details Name: Morgan [name] MRN: [id number] DOB: 1951/03/15   Cancelled Treatment:    Reason Eval/Treat Not Completed: Patient declined, no reason specified. Pt supine in bed with emesis bag in hand. She states she continues to be nauseous this date and is not feeling well. Kindly declines therapy and asks PT to attempt later this date. PT will return and attempt later this afternoon.    Patrina Levering PT, DPT 01/23/22 1:41 PM [phone]

## 2022-01-23 NOTE — Progress Notes (Signed)
PROGRESS NOTE    Morgan [name]  KZS:[id number] DOB: 1951-03-14 DOA: 01/18/2022 PCP: [name], MD  111A/111A-AA   Assessment & Plan:   Principal Problem:   Acute ischemic stroke [facility])   Morgan [name] is a 71 year old female with a past medical history significant for CHF, hypertension, mitral valve prolapse, Parkinson's disease, anemia, and arthritis who presented to [facility] ED on 01/18/2022 due to complaints of syncope, left-sided weakness/heaviness, left-sided facial droop, and left-sided visual field cut while at outpatient clinic getting a renal US.   Pt received TNK during stroke code and was admitted to ICU.  Subsequent MRI brain was neg for acute stroke.  Pt transferred to hospitalist service on 01/20/22.  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   --MRI brain neg for stroke.  Per Neuro Dr. Quinn Axe, symptoms may be due to complex migraine or conversion disorder.  MRI spine also neg for acute finding to explain pt's left-sided weakness and urinary incontinence. --headache didn't improve on cocktail of 15mg  IV toradol, 12.5mg  IV benadryl, and 10mg  IV decadron --s/p IV Depacon 1000 mg x1 for headache Plan: - Avoid compazine, phenergan, and reglan in the setting of Parkinson's disease, per neuro --Avoid narcotics, per neuro --sumatriptan 50 mg BID PRN --PT/OT   Urinary incontinence --MRI spine also neg for acute finding to explain pt's left-sided weakness and urinary incontinence.  Mild Leukocytosis ~ RESOLVED --not clinically significant   Acute Kidney Injury  Mild Hyponatremia ~ RESOLVED --s/p intermittent IVF --oral hydration now   Anemia of chronic disease --Hgb 9-10's --monitor  Asymptomatic bacteriuria --No urinary symptoms or septic. --no need for abx  Nausea --zofran PRN, with ativan PRN for breakthrough - Avoid compazine, phenergan, and reglan in the setting of Parkinson's disease, per neuro   DVT prophylaxis: Lovenox  SQ Code Status: Full code  Family Communication:  Level of care: Med-Surg Dispo:   The patient is from: home Anticipated d/c is to: SNF rehab Anticipated d/c date is: whenever bed available.  Patient currently is medically ready to d/c.   Subjective and Interval History:  Pt has been having a lot of nausea.  Didn't feel good today.  Still has intermittent headache.   Objective: Vitals:   01/23/22 0512 01/23/22 0757 01/23/22 1143 01/23/22 1855  BP: (!) 141/77 (!) 156/90 (!) 160/90 135/79  Pulse: 76 75 73 76  Resp: 18 17 19 16   Temp: 97.9 F (36.6 C) 98.5 F (36.9 C) 98.6 F (37 C) 97.7 F (36.5 C)  TempSrc: Oral Oral Oral   SpO2: 97% 99% 100% 98%  Weight:      Height:        Intake/Output Summary (Last 24 hours) at 01/23/2022 2030 Last data filed at 01/23/2022 1900 Gross per 24 hour  Intake 720 ml  Output 350 ml  Net 370 ml   Filed Weights   01/18/22 1054 01/18/22 1100 01/18/22 1700  Weight: 107.5 kg 107.5 kg 107.6 kg    Examination:   Constitutional: NAD, AAOx3 HEENT: conjunctivae and lids normal, EOMI CV: No cyanosis.   RESP: normal respiratory effort Extremities: No effusions, edema in BLE SKIN: warm, dry Psych: depressed mood and affect.    Data Reviewed: I have personally reviewed following labs and imaging studies  CBC: Recent Labs  Lab 01/18/22 1137 01/19/22 0443 01/20/22 0543 01/21/22 0317 01/22/22 0650 01/23/22 0519  WBC 10.9* 6.4 5.3 9.1 8.8 9.9  NEUTROABS 8.9*  --   --   --   --   --  HGB 9.2* 9.4* 10.3* 9.4* 10.0* 10.2*  HCT 30.0* 30.8* 32.1* 29.4* 31.7* 33.1*  MCV 86.5 83.7 83.4 81.9 83.6 85.1  PLT 226 236 269 248 257 024   Basic Metabolic Panel: Recent Labs  Lab 01/19/22 0443 01/20/22 0543 01/21/22 0317 01/22/22 0650 01/23/22 0519  NA 138 140 138 138 139  K 3.9 4.0 3.5 3.5 3.4*  CL 101 102 101 100 100  CO2 31 28 29 31  32  GLUCOSE 97 121* 108* 101* 105*  BUN 21 29* 30* 20 20  CREATININE 1.21* 1.23* 1.40* 1.25* 1.17*   CALCIUM 8.3* 8.9 8.6* 8.7* 8.9  MG  --  2.1 2.1 1.9 1.9  PHOS  --  4.1  --   --   --    GFR: Estimated Creatinine Clearance: 64.1 mL/min (A) (by C-G formula based on SCr of 1.17 mg/dL (H)). Liver Function Tests: Recent Labs  Lab 01/18/22 1137  AST 14*  ALT 6  ALKPHOS 108  BILITOT 0.3  PROT 6.1*  ALBUMIN 3.2*   No results for input(s): LIPASE, AMYLASE in the last 168 hours. No results for input(s): AMMONIA in the last 168 hours. Coagulation Profile: Recent Labs  Lab 01/18/22 1137  INR 1.0   Cardiac Enzymes: No results for input(s): CKTOTAL, CKMB, CKMBINDEX, TROPONINI in the last 168 hours. BNP (last 3 results) Recent Labs    08/29/21 0931  PROBNP 180.0*   HbA1C: No results for input(s): HGBA1C in the last 72 hours.  CBG: Recent Labs  Lab 01/18/22 1052 01/18/22 1701 01/18/22 2110  GLUCAP 98 76 85   Lipid Profile: No results for input(s): CHOL, HDL, LDLCALC, TRIG, CHOLHDL, LDLDIRECT in the last 72 hours.  Thyroid Function Tests: No results for input(s): TSH, T4TOTAL, FREET4, T3FREE, THYROIDAB in the last 72 hours. Anemia Panel: No results for input(s): VITAMINB12, FOLATE, FERRITIN, TIBC, IRON, RETICCTPCT in the last 72 hours. Sepsis Labs: Recent Labs  Lab 01/18/22 1137  PROCALCITON <0.10    Recent Results (from the past 240 hour(s))  Resp Panel by RT-PCR (Flu A&B, Covid) Nasopharyngeal Swab     Status: None   Collection Time: 01/18/22 12:50 PM   Specimen: Nasopharyngeal Swab; Nasopharyngeal(NP) swabs in vial transport medium  Result Value Ref Range Status   SARS Coronavirus 2 by RT PCR NEGATIVE NEGATIVE Final    Comment: (NOTE) SARS-CoV-2 target nucleic acids are NOT DETECTED.  The SARS-CoV-2 RNA is generally detectable in upper respiratory specimens during the acute phase of infection. The lowest concentration of SARS-CoV-2 viral copies this assay can detect is 138 copies/mL. A negative result does not preclude SARS-Cov-2 infection and should not  be used as the sole basis for treatment or other patient management decisions. A negative result may occur with  improper specimen collection/handling, submission of specimen other than nasopharyngeal swab, presence of viral mutation(s) within the areas targeted by this assay, and inadequate number of viral copies(<138 copies/mL). A negative result must be combined with clinical observations, patient history, and epidemiological information. The expected result is Negative.  Fact Sheet for Patients:  EntrepreneurPulse.com.au  Fact Sheet for Healthcare Providers:  IncredibleEmployment.be  This test is no t yet approved or cleared by the [place] FDA and  has been authorized for detection and/or diagnosis of SARS-CoV-2 by FDA under an Emergency Use Authorization (EUA). This EUA will remain  in effect (meaning this test can be used) for the duration of the COVID-19 declaration under Section 564(b)(1) of the Act, 21 U.S.C.section 360bbb-3(b)(1), unless the  authorization is terminated  or revoked sooner.       Influenza A by PCR NEGATIVE NEGATIVE Final   Influenza B by PCR NEGATIVE NEGATIVE Final    Comment: (NOTE) The Xpert Xpress SARS-CoV-2/FLU/RSV plus assay is intended as an aid in the diagnosis of influenza from Nasopharyngeal swab specimens and should not be used as a sole basis for treatment. Nasal washings and aspirates are unacceptable for Xpert Xpress SARS-CoV-2/FLU/RSV testing.  Fact Sheet for Patients: EntrepreneurPulse.com.au  Fact Sheet for Healthcare Providers: IncredibleEmployment.be  This test is not yet approved or cleared by the [place] FDA and has been authorized for detection and/or diagnosis of SARS-CoV-2 by FDA under an Emergency Use Authorization (EUA). This EUA will remain in effect (meaning this test can be used) for the duration of the COVID-19 declaration under Section  564(b)(1) of the Act, 21 U.S.C. section 360bbb-3(b)(1), unless the authorization is terminated or revoked.  Performed at [facility], [address] [place], [place] [address]   Urine Culture     Status: Abnormal   Collection Time: 01/18/22 12:58 PM   Specimen: Urine, Clean Catch  Result Value Ref Range Status   Specimen Description   Final    URINE, CLEAN CATCH Performed at [facility], [address] [place], [place] [address]    Special Requests   Final    NONE Performed at [facility], [place], [place] [address]    Culture >=100,000 COLONIES/mL ESCHERICHIA COLI (A)  Final   Report Status 01/21/2022 FINAL  Final   Organism ID, Bacteria ESCHERICHIA COLI (A)  Final      Susceptibility   Escherichia coli - MIC*    AMPICILLIN 8 SENSITIVE Sensitive     CEFAZOLIN <=4 SENSITIVE Sensitive     CEFEPIME <=0.12 SENSITIVE Sensitive     CEFTRIAXONE <=0.25 SENSITIVE Sensitive     CIPROFLOXACIN <=0.25 SENSITIVE Sensitive     GENTAMICIN <=1 SENSITIVE Sensitive     IMIPENEM <=0.25 SENSITIVE Sensitive     NITROFURANTOIN <=16 SENSITIVE Sensitive     TRIMETH/SULFA <=20 SENSITIVE Sensitive     AMPICILLIN/SULBACTAM 4 SENSITIVE Sensitive     PIP/TAZO <=4 SENSITIVE Sensitive     * >=100,000 COLONIES/mL ESCHERICHIA COLI  MRSA Next Gen by PCR, Nasal     Status: None   Collection Time: 01/18/22  4:59 PM   Specimen: Nasal Mucosa; Nasal Swab  Result Value Ref Range Status   MRSA by PCR Next Gen NOT DETECTED NOT DETECTED Final    Comment: (NOTE) The GeneXpert MRSA Assay (FDA approved for NASAL specimens only), is one component of a comprehensive MRSA colonization surveillance program. It is not intended to diagnose MRSA infection nor to guide or monitor treatment for MRSA infections. Test performance is not FDA approved in patients less than 49 years old. Performed at [facility], [address] [place], [place] [address]        Radiology Studies: MR CERVICAL SPINE W WO CONTRAST  Result Date: 01/22/2022 CLINICAL DATA:  Left upper extremity weakness EXAM: MRI CERVICAL AND THORACIC SPINE WITHOUT AND WITH CONTRAST TECHNIQUE: Multiplanar and multiecho pulse sequences of the cervical spine, to include the craniocervical junction and cervicothoracic junction, and the thoracic spine, were obtained without and with intravenous contrast. CONTRAST:  15mL GADAVIST GADOBUTROL 1 MMOL/ML IV SOLN COMPARISON:  MRI thoracic spine 08/18/2021, MRI cervical spine 10/05/2017. FINDINGS: MRI CERVICAL SPINE FINDINGS Evaluation is somewhat limited by motion artifact. Alignment: Trace anterolisthesis C3 on  C4, unchanged. Vertebrae: No acute fracture or suspicious osseous lesion. No abnormal enhancement. Cord: Normal signal and morphology.  No abnormal enhancement. Posterior Fossa, vertebral arteries, paraspinal tissues: Negative. Disc levels: C2-C3: No significant disc bulge. Mild facet arthropathy. No spinal canal stenosis or neuroforaminal narrowing. C3-C4: Trace anterolisthesis. Left-greater-than-right facet and uncovertebral hypertrophy. No spinal canal stenosis. Moderate left neural foraminal narrowing, unchanged. C4-C5: No significant disc bulge. Facet and uncovertebral hypertrophy. No spinal canal stenosis. Mild left-greater-than-right neural foraminal narrowing. C5-C6: Minimal disc bulge. Facet and uncovertebral hypertrophy. No spinal canal stenosis. Mild bilateral neural foraminal narrowing. C6-C7: No significant disc bulge. Left-greater-than-right facet and uncovertebral hypertrophy. No spinal canal stenosis. Mild left neural foraminal narrowing. C7-T1: No significant disc bulge. No spinal canal stenosis or neuroforaminal narrowing. MRI THORACIC SPINE FINDINGS Alignment:  Physiologic. Vertebrae: Redemonstrated compression deformities of T9 and T10, which are both status post kyphoplasty, without additional vertebral body height loss. Persistent  edema is noted in these vertebral bodies, with increased T2 signal and enhancement, although it is decreased compared to the prior exam. No acute fracture. No abnormal enhancement. Cord:  Normal signal and morphology.  No abnormal enhancement. Paraspinal and other soft tissues: Negative. Disc levels: No significant spinal canal stenosis. Small disc bulges, the most prominent of which is a left subarticular protrusion at T11-T12, unchanged. No significant neural foraminal narrowing. IMPRESSION: 1. No acute fracture in the cervical or thoracic spine. Redemonstrated compression deformities of T9 and T10, status post kyphoplasty, without additional vertebral body height loss but with some persistent edema. 2. No spinal canal stenosis in the cervical or thoracic spine. 3. Multilevel uncovertebral and facet hypertrophy in the cervical spine, which causes moderate left neural foraminal narrowing at C3-C4, mild bilateral neural foraminal narrowing at C4-C5 and C5-C6, and mild left neural foraminal narrowing at C6-C7, which appear overall unchanged compared to 10/05/2017. Electronically Signed   By: Merilyn Baba M.D.   On: 01/22/2022 00:55   MR THORACIC SPINE W WO CONTRAST  Result Date: 01/22/2022 CLINICAL DATA:  Left upper extremity weakness EXAM: MRI CERVICAL AND THORACIC SPINE WITHOUT AND WITH CONTRAST TECHNIQUE: Multiplanar and multiecho pulse sequences of the cervical spine, to include the craniocervical junction and cervicothoracic junction, and the thoracic spine, were obtained without and with intravenous contrast. CONTRAST:  65mL GADAVIST GADOBUTROL 1 MMOL/ML IV SOLN COMPARISON:  MRI thoracic spine 08/18/2021, MRI cervical spine 10/05/2017. FINDINGS: MRI CERVICAL SPINE FINDINGS Evaluation is somewhat limited by motion artifact. Alignment: Trace anterolisthesis C3 on C4, unchanged. Vertebrae: No acute fracture or suspicious osseous lesion. No abnormal enhancement. Cord: Normal signal and morphology.  No abnormal  enhancement. Posterior Fossa, vertebral arteries, paraspinal tissues: Negative. Disc levels: C2-C3: No significant disc bulge. Mild facet arthropathy. No spinal canal stenosis or neuroforaminal narrowing. C3-C4: Trace anterolisthesis. Left-greater-than-right facet and uncovertebral hypertrophy. No spinal canal stenosis. Moderate left neural foraminal narrowing, unchanged. C4-C5: No significant disc bulge. Facet and uncovertebral hypertrophy. No spinal canal stenosis. Mild left-greater-than-right neural foraminal narrowing. C5-C6: Minimal disc bulge. Facet and uncovertebral hypertrophy. No spinal canal stenosis. Mild bilateral neural foraminal narrowing. C6-C7: No significant disc bulge. Left-greater-than-right facet and uncovertebral hypertrophy. No spinal canal stenosis. Mild left neural foraminal narrowing. C7-T1: No significant disc bulge. No spinal canal stenosis or neuroforaminal narrowing. MRI THORACIC SPINE FINDINGS Alignment:  Physiologic. Vertebrae: Redemonstrated compression deformities of T9 and T10, which are both status post kyphoplasty, without additional vertebral body height loss. Persistent edema is noted in these vertebral bodies, with increased T2 signal and enhancement, although it is  decreased compared to the prior exam. No acute fracture. No abnormal enhancement. Cord:  Normal signal and morphology.  No abnormal enhancement. Paraspinal and other soft tissues: Negative. Disc levels: No significant spinal canal stenosis. Small disc bulges, the most prominent of which is a left subarticular protrusion at T11-T12, unchanged. No significant neural foraminal narrowing. IMPRESSION: 1. No acute fracture in the cervical or thoracic spine. Redemonstrated compression deformities of T9 and T10, status post kyphoplasty, without additional vertebral body height loss but with some persistent edema. 2. No spinal canal stenosis in the cervical or thoracic spine. 3. Multilevel uncovertebral and facet hypertrophy  in the cervical spine, which causes moderate left neural foraminal narrowing at C3-C4, mild bilateral neural foraminal narrowing at C4-C5 and C5-C6, and mild left neural foraminal narrowing at C6-C7, which appear overall unchanged compared to 10/05/2017. Electronically Signed   By: Merilyn Baba M.D.   On: 01/22/2022 00:55     Scheduled Meds:  aspirin  81 mg Oral Daily   enoxaparin (LOVENOX) injection  40 mg Subcutaneous Q24H   pantoprazole (PROTONIX) IV  40 mg Intravenous QHS   potassium chloride  40 mEq Oral Once   Continuous Infusions:     LOS: 5 days     Enzo Bi, MD [facility] If 7PM-7AM, please contact night-coverage 01/23/2022, 8:30 PM

## 2022-01-24 DIAGNOSIS — G43419 Hemiplegic migraine, intractable, without status migrainosus: Secondary | ICD-10-CM

## 2022-01-24 DIAGNOSIS — N1831 Chronic kidney disease, stage 3a: Secondary | ICD-10-CM

## 2022-01-24 DIAGNOSIS — J9611 Chronic respiratory failure with hypoxia: Secondary | ICD-10-CM

## 2022-01-24 DIAGNOSIS — G43409 Hemiplegic migraine, not intractable, without status migrainosus: Secondary | ICD-10-CM

## 2022-01-24 LAB — BASIC METABOLIC PANEL
Anion gap: 6 (ref 5–15)
BUN: 17 mg/dL (ref 8–23)
CO2: 32 mmol/L (ref 22–32)
Calcium: 8.6 mg/dL — ABNORMAL LOW (ref 8.9–10.3)
Chloride: 99 mmol/L (ref 98–111)
Creatinine, Ser: 1.18 mg/dL — ABNORMAL HIGH (ref 0.44–1.00)
GFR, Estimated: 50 mL/min — ABNORMAL LOW (ref >60)
Glucose, Bld: 117 mg/dL — ABNORMAL HIGH (ref 70–99)
Potassium: 3.2 mmol/L — ABNORMAL LOW (ref 3.5–5.1)
Sodium: 137 mmol/L (ref 135–145)

## 2022-01-24 LAB — CBC
HCT: 32.4 % — ABNORMAL LOW (ref 36.0–46.0)
Hemoglobin: 10.1 g/dL — ABNORMAL LOW (ref 12.0–15.0)
MCH: 26.2 pg (ref 26.0–34.0)
MCHC: 31.2 g/dL (ref 30.0–36.0)
MCV: 84.2 fL (ref 80.0–100.0)
Platelets: 254 10*3/uL (ref 150–400)
RBC: 3.85 MIL/uL — ABNORMAL LOW (ref 3.87–5.11)
RDW: 14.3 % (ref 11.5–15.5)
WBC: 9.8 10*3/uL (ref 4.0–10.5)
nRBC: 0 % (ref 0.0–0.2)

## 2022-01-24 LAB — MAGNESIUM: Magnesium: 2 mg/dL (ref 1.7–2.4)

## 2022-01-24 MED ORDER — POTASSIUM CHLORIDE 10 MEQ/100ML IV SOLN
10.0000 meq | INTRAVENOUS | Status: AC
  Administered 2022-01-24 (×3): 10 meq via INTRAVENOUS
  Filled 2022-01-24 (×3): qty 100

## 2022-01-24 MED ORDER — DIVALPROEX SODIUM 250 MG PO DR TAB
250.0000 mg | DELAYED_RELEASE_TABLET | Freq: Two times a day (BID) | ORAL | Status: DC
  Administered 2022-01-24 – 2022-01-25 (×3): 250 mg via ORAL
  Filled 2022-01-24 (×5): qty 1

## 2022-01-24 NOTE — Progress Notes (Signed)
PROGRESS NOTE    Morgan [name]  JGG:[id number] DOB: April 06, 1951 DOA: 01/18/2022 PCP: [name], MD   Chief complaint.  Headache and left-sided weakness. Brief Narrative:  Morgan [name] is a 71 year old female with a past medical history significant for CHF, hypertension, mitral valve prolapse, Parkinson's disease, anemia, and arthritis who presented to [facility] ED on 01/18/2022 due to complaints of syncope, left-sided weakness/heaviness, left-sided facial droop, and left-sided visual field cut while at outpatient clinic getting a renal US.   Pt received TNK during stroke code and was admitted to ICU.  Subsequent MRI brain was neg for acute stroke.  Pt transferred to hospitalist service on 01/20/22.   Assessment & Plan:   Principal Problem:   Acute ischemic stroke (HCC)  Hemiplegic migraine with persistent left-sided weakness. Patient still has significant left-sided migraine headache, nausea and dizziness. She has been evaluated by neurology, MRI has ruled out a stroke. Currently she is on sumatriptan 50 mg p.o. twice daily as needed. She received IV Depacon x1 dose. I will start Depakote 250 mg twice a day persistent headache. Continue PT/OT.  Chronic kidney disease stage IIIa. AKI ruled out Mild hyponatremia Reviewed patient chart, patient has a chronic kidney disease stage IIIa based on records.  Does not meet criteria for AKI. Renal function has been stable.  COPD  Chronic hypoxemic respiratory failure Condition has been stable.  Anemia of chronic disease. Continue to monitor.   DVT prophylaxis: Lovenox Code Status: full Family Communication:  Disposition Plan:    Status is: Inpatient Remains inpatient appropriate because: Severity of disease, still symptomatic.  Also pending nursing home placement.       I/O last 3 completed shifts: In: 840 [P.O.:840] Out: 1000 [Urine:1000] Total I/O In: 120 [P.O.:120] Out: -      Consultants:  Neurology  Procedures:  None  Antimicrobials: None  Subjective: Patient still has significant left-sided migraine headache, nauseous.  She is very dizzy.  She is unsteady when she walks. Discussed with PT/OT.  Not ready for discharge. Denies any short of breath or cough, chronically on 3 L oxygen.   Objective: Vitals:   01/23/22 1855 01/24/22 0025 01/24/22 0503 01/24/22 0813  BP: 135/79 (!) 141/76 (!) 147/72 127/81  Pulse: 76 70 72 72  Resp: 16 18 18 16   Temp: 97.7 F (36.5 C) 98.1 F (36.7 C) 97.9 F (36.6 C) 98.1 F (36.7 C)  TempSrc:  Oral Oral Oral  SpO2: 98% 98% 97% 99%  Weight:      Height:        Intake/Output Summary (Last 24 hours) at 01/24/2022 1043 Last data filed at 01/24/2022 0900 Gross per 24 hour  Intake 600 ml  Output 1000 ml  Net -400 ml   Filed Weights   01/18/22 1054 01/18/22 1100 01/18/22 1700  Weight: 107.5 kg 107.5 kg 107.6 kg    Examination:  General exam: Appears calm and comfortable  Respiratory system: Decreased breathing sounds. Respiratory effort normal. Cardiovascular system: S1 & S2 heard, RRR. No JVD, murmurs, rubs, gallops or clicks. No pedal edema. Gastrointestinal system: Abdomen is nondistended, soft and nontender. No organomegaly or masses felt. Normal bowel sounds heard. Central nervous system: Alert and oriented. No focal neurological deficits. Extremities: Symmetric 5 x 5 power. Skin: No rashes, lesions or ulcers Psychiatry: Judgement and insight appear normal. Mood & affect appropriate.     Data Reviewed: I have personally reviewed following labs and imaging studies  CBC: Recent Labs  Lab 01/18/22 1137 01/19/22 0443  01/20/22 0543 01/21/22 0317 01/22/22 0650 01/23/22 0519 01/24/22 0440  WBC 10.9*   < > 5.3 9.1 8.8 9.9 9.8  NEUTROABS 8.9*  --   --   --   --   --   --   HGB 9.2*   < > 10.3* 9.4* 10.0* 10.2* 10.1*  HCT 30.0*   < > 32.1* 29.4* 31.7* 33.1* 32.4*  MCV 86.5   < > 83.4 81.9 83.6 85.1 84.2  PLT 226   < > 269 248 257 250 254   <  > = values in this interval not displayed.   Basic Metabolic Panel: Recent Labs  Lab 01/20/22 0543 01/21/22 0317 01/22/22 0650 01/23/22 0519 01/24/22 0440  NA 140 138 138 139 137  K 4.0 3.5 3.5 3.4* 3.2*  CL 102 101 100 100 99  CO2 28 29 31  32 32  GLUCOSE 121* 108* 101* 105* 117*  BUN 29* 30* 20 20 17   CREATININE 1.23* 1.40* 1.25* 1.17* 1.18*  CALCIUM 8.9 8.6* 8.7* 8.9 8.6*  MG 2.1 2.1 1.9 1.9 2.0  PHOS 4.1  --   --   --   --    GFR: Estimated Creatinine Clearance: 63.6 mL/min (A) (by C-G formula based on SCr of 1.18 mg/dL (H)). Liver Function Tests: Recent Labs  Lab 01/18/22 1137  AST 14*  ALT 6  ALKPHOS 108  BILITOT 0.3  PROT 6.1*  ALBUMIN 3.2*   No results for input(s): LIPASE, AMYLASE in the last 168 hours. No results for input(s): AMMONIA in the last 168 hours. Coagulation Profile: Recent Labs  Lab 01/18/22 1137  INR 1.0   Cardiac Enzymes: No results for input(s): CKTOTAL, CKMB, CKMBINDEX, TROPONINI in the last 168 hours. BNP (last 3 results) Recent Labs    08/29/21 0931  PROBNP 180.0*   HbA1C: No results for input(s): HGBA1C in the last 72 hours. CBG: Recent Labs  Lab 01/18/22 1052 01/18/22 1701 01/18/22 2110  GLUCAP 98 76 85   Lipid Profile: No results for input(s): CHOL, HDL, LDLCALC, TRIG, CHOLHDL, LDLDIRECT in the last 72 hours. Thyroid Function Tests: No results for input(s): TSH, T4TOTAL, FREET4, T3FREE, THYROIDAB in the last 72 hours. Anemia Panel: No results for input(s): VITAMINB12, FOLATE, FERRITIN, TIBC, IRON, RETICCTPCT in the last 72 hours. Sepsis Labs: Recent Labs  Lab 01/18/22 1137  PROCALCITON <0.10    Recent Results (from the past 240 hour(s))  Resp Panel by RT-PCR (Flu A&B, Covid) Nasopharyngeal Swab     Status: None   Collection Time: 01/18/22 12:50 PM   Specimen: Nasopharyngeal Swab; Nasopharyngeal(NP) swabs in vial transport medium  Result Value Ref Range Status   SARS Coronavirus 2 by RT PCR NEGATIVE NEGATIVE  Final    Comment: (NOTE) SARS-CoV-2 target nucleic acids are NOT DETECTED.  The SARS-CoV-2 RNA is generally detectable in upper respiratory specimens during the acute phase of infection. The lowest concentration of SARS-CoV-2 viral copies this assay can detect is 138 copies/mL. A negative result does not preclude SARS-Cov-2 infection and should not be used as the sole basis for treatment or other patient management decisions. A negative result may occur with  improper specimen collection/handling, submission of specimen other than nasopharyngeal swab, presence of viral mutation(s) within the areas targeted by this assay, and inadequate number of viral copies(<138 copies/mL). A negative result must be combined with clinical observations, patient history, and epidemiological information. The expected result is Negative.  Fact Sheet for Patients:  EntrepreneurPulse.com.au  Fact Sheet for Healthcare Providers:  IncredibleEmployment.be  This test is no t yet approved or cleared by the [place] and  has been authorized for detection and/or diagnosis of SARS-CoV-2 by FDA under an Emergency Use Authorization (EUA). This EUA will remain  in effect (meaning this test can be used) for the duration of the COVID-19 declaration under Section 564(b)(1) of the Act, 21 U.S.C.section 360bbb-3(b)(1), unless the authorization is terminated  or revoked sooner.       Influenza A by PCR NEGATIVE NEGATIVE Final   Influenza B by PCR NEGATIVE NEGATIVE Final    Comment: (NOTE) The Xpert Xpress SARS-CoV-2/FLU/RSV plus assay is intended as an aid in the diagnosis of influenza from Nasopharyngeal swab specimens and should not be used as a sole basis for treatment. Nasal washings and aspirates are unacceptable for Xpert Xpress SARS-CoV-2/FLU/RSV testing.  Fact Sheet for Patients: EntrepreneurPulse.com.au  Fact Sheet for Healthcare  Providers: IncredibleEmployment.be  This test is not yet approved or cleared by the [place] FDA and has been authorized for detection and/or diagnosis of SARS-CoV-2 by FDA under an Emergency Use Authorization (EUA). This EUA will remain in effect (meaning this test can be used) for the duration of the COVID-19 declaration under Section 564(b)(1) of the Act, 21 U.S.C. section 360bbb-3(b)(1), unless the authorization is terminated or revoked.  Performed at [facility], [address] [place], [place] [address]   Urine Culture     Status: Abnormal   Collection Time: 01/18/22 12:58 PM   Specimen: Urine, Clean Catch  Result Value Ref Range Status   Specimen Description   Final    URINE, CLEAN CATCH Performed at [facility], [address] [place], [place] [address]    Special Requests   Final    NONE Performed at [facility], [place], [place] [address]    Culture >=100,000 COLONIES/mL ESCHERICHIA COLI (A)  Final   Report Status 01/21/2022 FINAL  Final   Organism ID, Bacteria ESCHERICHIA COLI (A)  Final      Susceptibility   Escherichia coli - MIC*    AMPICILLIN 8 SENSITIVE Sensitive     CEFAZOLIN <=4 SENSITIVE Sensitive     CEFEPIME <=0.12 SENSITIVE Sensitive     CEFTRIAXONE <=0.25 SENSITIVE Sensitive     CIPROFLOXACIN <=0.25 SENSITIVE Sensitive     GENTAMICIN <=1 SENSITIVE Sensitive     IMIPENEM <=0.25 SENSITIVE Sensitive     NITROFURANTOIN <=16 SENSITIVE Sensitive     TRIMETH/SULFA <=20 SENSITIVE Sensitive     AMPICILLIN/SULBACTAM 4 SENSITIVE Sensitive     PIP/TAZO <=4 SENSITIVE Sensitive     * >=100,000 COLONIES/mL ESCHERICHIA COLI  MRSA Next Gen by PCR, Nasal     Status: None   Collection Time: 01/18/22  4:59 PM   Specimen: Nasal Mucosa; Nasal Swab  Result Value Ref Range Status   MRSA by PCR Next Gen NOT DETECTED NOT DETECTED Final    Comment: (NOTE) The GeneXpert MRSA Assay (FDA approved for  NASAL specimens only), is one component of a comprehensive MRSA colonization surveillance program. It is not intended to diagnose MRSA infection nor to guide or monitor treatment for MRSA infections. Test performance is not FDA approved in patients less than 81 years old. Performed at [facility], [address], [place],  [address]          Radiology Studies: No results found.      Scheduled Meds:  aspirin  81 mg Oral Daily   divalproex  250 mg  Oral Q12H   enoxaparin (LOVENOX) injection  40 mg Subcutaneous Q24H   pantoprazole (PROTONIX) IV  40 mg Intravenous QHS   potassium chloride  40 mEq Oral Once   Continuous Infusions:  potassium chloride 10 mEq (01/24/22 0957)     LOS: 6 days    Time spent: 35 minutes    Sharen Hones, MD [facility]   To contact the attending provider between 7A-7P or the covering provider during after hours 7P-7A, please log into the web site [email or web address] and access using universal [place] password for that web site. If you do not have the password, please call the hospital operator.  01/24/2022, 10:43 AM

## 2022-01-24 NOTE — Progress Notes (Signed)
Patient has been rescheduled for 02/02/22 at 11:00am.   Charlene Brooke, CPP notified  Margaretmary Dys, [place] [facility] Assistant [phone]

## 2022-01-24 NOTE — Care Management Important Message (Signed)
Important Message  Patient Details  Name: Morgan [name] MRN: [id number] Date of Birth: [birth year]   Medicare Important Message Given:  Yes     Dannette Barbara 01/24/2022, 12:38 PM

## 2022-01-24 NOTE — Progress Notes (Signed)
Occupational Therapy Treatment Patient Details Name: Morgan [name] MRN: [id number] DOB: [birth year] Today's Date: 01/24/2022   History of present illness Pt is a 71 y/o F who presented to the ED with c/o syncope, L sided weakness/heaviness, & L sided facial droop. Code stroke was called & pt received TNK at 1119; pt was admitted to the ICU for further work up & monitoring. Initial MRI was negative for acute processes. PMH: CHF, HTN, mitral valve prolapse, e. coli UTIs, PD, anemia, arthritis, COPD on 2L, CKD, BPPV   OT comments  Pt seen for OT tx this date to f/u re: safety with ADLs/ADL mobility. PTA treating pt and has her seated EOB when OT presents. Pt requires CGA/MIN A +2 to CTS x2 trials and take small shuffling/sliding side steps x3-4 left and right before needing seated rest break (between reps). OT attempts to engage pt in standing peri care while upright, but pt's standing balance too poor to tolerate removing hand from walker for posterior peri care in standing. She is able to perform anterior while seated with SETUP using sit/lateral lean method as instructed by OT. Pt returned to bed end of session and c/o nausea with positional change. She is left with emesis bag and all other needs met/in reach. Will continue to follow.   Recommendations for follow up therapy are one component of a multi-disciplinary discharge planning process, led by the attending physician.  Recommendations may be updated based on patient status, additional functional criteria and insurance authorization.    Follow Up Recommendations  Skilled nursing-short term rehab (<3 hours/day)    Assistance Recommended at Discharge Intermittent Supervision/Assistance  Patient can return home with the following  A lot of help with walking and/or transfers;A lot of help with bathing/dressing/bathroom;Direct supervision/assist for medications management;Direct supervision/assist for financial management;Assistance with  cooking/housework;Assist for transportation;Help with stairs or ramp for entrance   Equipment Recommendations  BSC/3in1;Other (comment) (2ww)    Recommendations for Other Services      Precautions / Restrictions Precautions Precautions: Fall Restrictions Weight Bearing Restrictions: No       Mobility Bed Mobility Overal bed mobility: Needs Assistance Bed Mobility: Supine to Sit     Supine to sit: Min assist, HOB elevated Sit to supine: Min guard   General bed mobility comments: VC hand placement, bed rail use. Encouraged to actively participate with LUE. CGA for head safety returning to bed as pt had difficulty turning body (to esure head did not hit bed rail)    Transfers Overall transfer level: Needs assistance Equipment used: Rolling walker (2 wheels) Transfers: Sit to/from Stand Sit to Stand: Min assist, +2 physical assistance           General transfer comment: MIN A +2 to stand, heavu steadying.     Balance Overall balance assessment: Needs assistance Sitting-balance support: Feet supported, Single extremity supported, No upper extremity supported Sitting balance-Leahy Scale: Fair Sitting balance - Comments: Fair sitting balance EOB; no LOB. Remained sitting for >15 minutes. Performed activities such as hair hygiene (w/ OT) and LE therex.   Standing balance support: Bilateral upper extremity supported, Reliant on assistive device for balance Standing balance-Leahy Scale: Poor Standing balance comment: reliant on RW for support, CGA+2 provided; complicated by nausea                           ADL either performed or assessed with clinical judgement   ADL Overall ADL's : Needs assistance/impaired  Grooming: Set up;Sitting Grooming Details (indicate cue type and reason): increased time for unsupported sitting ADLs as well as cues to extend cervical spine to reduce risk of atrophy.                     Toileting- Clothing Manipulation  and Hygiene: Maximal assistance;Sit to/from stand Toileting - Clothing Manipulation Details (indicate cue type and reason): MIN A +2 with walker to stand, MAX A for actual peri care as pt does not have good enough standing balance to alternate hands from walker for peri care.     Functional mobility during ADLs: Rolling walker (2 wheels);Cueing for sequencing;Cueing for safety;Minimal assistance;+2 for physical assistance (take side steps x2 trials x4-5 steps L and R at edge of bed)      Extremity/Trunk Assessment              Vision       Perception     Praxis      Cognition Arousal/Alertness: Awake/alert Behavior During Therapy: WFL for tasks assessed/performed Overall Cognitive Status: Within Functional Limits for tasks assessed                                 General Comments: some slow processing/response, but generally appropriate throughout session and mostly oriented except for date.        Exercises Other Exercises Other Exercises: OT engaged pt in seated postural extension x10 reps to improve surface area for breathing and prevent atrophy to cervical muscles    Shoulder Instructions       General Comments      Pertinent Vitals/ Pain       Pain Assessment Pain Assessment: Faces Faces Pain Scale: Hurts little more Pain Location: headache, L temple Pain Descriptors / Indicators: Headache Pain Intervention(s): Monitored during session, Repositioned, Ice applied  Home Living                                          Prior Functioning/Environment              Frequency  Min 2X/week        Progress Toward Goals  OT Goals(current goals can now be found in the care plan section)     Acute Rehab OT Goals Patient Stated Goal: be indep OT Goal Formulation: With patient Time For Goal Achievement: 02/02/22 Potential to Achieve Goals: Good  Plan Discharge plan needs to be updated;Frequency needs to be updated     Co-evaluation    PT/OT/SLP Co-Evaluation/Treatment: Yes Reason for Co-Treatment: Complexity of the patient's impairments (multi-system involvement);For patient/therapist safety PT goals addressed during session: Mobility/safety with mobility OT goals addressed during session: ADL's and self-care      AM-PAC OT "6 Clicks" Daily Activity     Outcome Measure   Help from another person eating meals?: A Little Help from another person taking care of personal grooming?: A Little Help from another person toileting, which includes using toliet, bedpan, or urinal?: A Lot Help from another person bathing (including washing, rinsing, drying)?: A Lot Help from another person to put on and taking off regular upper body clothing?: A Lot Help from another person to put on and taking off regular lower body clothing?: A Lot 6 Click Score: 14    End of Session Equipment Utilized During  Treatment: Rolling walker (2 wheels)  OT Visit Diagnosis: Other abnormalities of gait and mobility (R26.89);Hemiplegia and hemiparesis Hemiplegia - Right/Left: Left Hemiplegia - dominant/non-dominant: Dominant Hemiplegia - caused by: Unspecified   Activity Tolerance Treatment limited secondary to medical complications (Comment)   Patient Left in bed;Other (comment);with call bell/phone within reach   Nurse Communication Other (comment) (nausea)        Time: 0254-8628 OT Time Calculation (min): 21 min  Charges: OT General Charges $OT Visit: 1 Visit OT Treatments $Self Care/Home Management : 8-22 mins  Gerrianne Scale, [place], OTR/L ascom [phone] 01/24/22, 5:23 PM

## 2022-01-24 NOTE — TOC Progression Note (Signed)
Transition of Care [facility]) - Progression Note    Patient Details  Name: Morgan [name] MRN: [id number] Date of Birth: 1951-02-17  Transition of Care [facility]) CM/SW [place], RN Phone Number: 01/24/2022, 3:46 PM  Clinical Narrative:   Patient and family chose [facility].  Tammy aware, will get auth    Expected Discharge Plan: IP Rehab Facility Barriers to Discharge: Continued Medical Work up  Expected Discharge Plan and Services Expected Discharge Plan: [place]   Discharge Planning Services: CM Consult   Living arrangements for the past 2 months: Single Family Home                 DME Arranged: N/A DME Agency: NA                   Social Determinants of Health (SDOH) Interventions    Readmission Risk Interventions No flowsheet data found.

## 2022-01-24 NOTE — Progress Notes (Addendum)
Physical Therapy Treatment Patient Details Name: Morgan [name] MRN: [id number] DOB: 04-28-1951 Today's Date: 01/24/2022   History of Present Illness Pt is a 71 y/o F who presented to the ED with c/o syncope, L sided weakness/heaviness, & L sided facial droop. Code stroke was called & pt received TNK at 1119; pt was admitted to the ICU for further work up & monitoring. Initial MRI was negative for acute processes. PMH: CHF, HTN, mitral valve prolapse, e. 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 Ready for session.  Supine AROM x 10.  To EOB with min a x 1 and was able to remain upright x 30 minutes today.  She was able to sit for AROM and general sitting tolerance as frequent rest was needed for nausea and dizziness.  She is able to lateral scoot to left in sitting.  OT arrived during session and pt was able to stand x 2 with min a x 2 and takes lateral steps right and left along bed.  She does have a buckle after about 5 lateral steps but controlled by pt as therapists.  She will need +2 and a follow for gait attempts away from bed.  L side remains with strength deficits.   She returns to supine with min a x 1.  OT remains in room for care and for completion of OT Session.  Nausea and dizziness remain primary barrier for session.   Recommendations for follow up therapy are one component of a multi-disciplinary discharge planning process, led by the attending physician.  Recommendations may be updated based on patient status, additional functional criteria and insurance authorization.  Follow Up Recommendations  Skilled nursing-short term rehab (<3 hours/day)     Assistance Recommended at Discharge Frequent or constant Supervision/Assistance  Patient can return home with the following A lot of help with walking and/or transfers;A lot of help with bathing/dressing/bathroom;Assist for transportation;Assistance with  cooking/housework;Direct supervision/assist for financial management;Help with stairs or ramp for entrance;Direct supervision/assist for medications management   Equipment Recommendations  None recommended by PT    Recommendations for Other Services       Precautions / Restrictions Precautions Precautions: Fall Restrictions Weight Bearing Restrictions: No     Mobility  Bed Mobility                    Transfers                        Ambulation/Gait                   Stairs             Wheelchair Mobility    Modified Rankin (Stroke Patients Only)       Balance Overall balance assessment: Needs assistance Sitting-balance support: Feet supported, Single extremity supported, No upper extremity supported Sitting balance-Leahy Scale: Fair Sitting balance - Comments: Fair sitting balance EOB; no LOB. Remained sitting for >15 minutes. Performed activities such as hair hygiene (w/ OT) and LE therex.   Standing balance support: Bilateral upper extremity supported, Reliant on assistive device for balance Standing balance-Leahy Scale: Poor Standing balance comment: reliant on RW for support, CGA+2 provided; complicated by nausea                            Cognition  Exercises      General Comments        Pertinent Vitals/Pain Pain Assessment Pain Assessment: Faces Faces Pain Scale: Hurts little more Pain Location: headache, L temple Pain Descriptors / Indicators: Headache Pain Intervention(s): Monitored during session, Limited activity within patient's tolerance    Home Living                          Prior Function            PT Goals (current goals can now be found in the care plan section) Progress towards PT goals: Progressing toward goals    Frequency    7X/week      PT Plan Current plan remains appropriate     Co-evaluation PT/OT/SLP Co-Evaluation/Treatment: Yes Reason for Co-Treatment: Complexity of the patient's impairments (multi-system involvement) PT goals addressed during session: Mobility/safety with mobility;Strengthening/ROM OT goals addressed during session: ADL's and self-care      AM-PAC PT "6 Clicks" Mobility   Outcome Measure  Help needed turning from your back to your side while in a flat bed without using bedrails?: A Little Help needed moving from lying on your back to sitting on the side of a flat bed without using bedrails?: A Little Help needed moving to and from a bed to a chair (including a wheelchair)?: A Lot Help needed standing up from a chair using your arms (e.g., wheelchair or bedside chair)?: A Lot Help needed to walk in hospital room?: A Lot Help needed climbing 3-5 steps with a railing? : Total 6 Click Score: 13    End of Session Equipment Utilized During Treatment: Gait belt;Oxygen Activity Tolerance: Patient tolerated treatment well;Treatment limited secondary to medical complications (Comment) Patient left: in bed;with bed alarm set;with call bell/phone within reach Nurse Communication: Mobility status PT Visit Diagnosis: Difficulty in walking, not elsewhere classified (R26.2);Muscle weakness (generalized) (M62.81);Hemiplegia and hemiparesis Hemiplegia - Right/Left: Left Hemiplegia - dominant/non-dominant: Dominant Hemiplegia - caused by: Unspecified     Time: 8563-1497 PT Time Calculation (min) (ACUTE ONLY): 39 min  Charges:  $Therapeutic Exercise: 8-22 mins $Therapeutic Activity: 8-22 mins                    Chesley Noon, PTA 01/24/22, 9:41 AM

## 2022-01-25 MED ORDER — DIVALPROEX SODIUM 500 MG PO DR TAB
500.0000 mg | DELAYED_RELEASE_TABLET | Freq: Two times a day (BID) | ORAL | Status: DC
  Administered 2022-01-25 – 2022-01-30 (×10): 500 mg via ORAL
  Filled 2022-01-25 (×10): qty 1

## 2022-01-25 NOTE — Progress Notes (Signed)
Occupational Therapy Treatment Patient Details Name: Morgan [name] MRN: [id number] DOB: [birth year] Today's Date: 01/25/2022   History of present illness Pt is a 71 y/o F who presented to the ED with c/o syncope, L sided weakness/heaviness, & L sided facial droop. Code stroke was called & pt received TNK at 1119; pt was admitted to the ICU for further work up & monitoring. Initial MRI was negative for acute processes. PMH: CHF, HTN, mitral valve prolapse, e. 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 Pt seated EOB With PTA start of session. PTA/OT complete MIN A +2 STS with RW and pt is able to complete ~3-4 small shuffling steps from bed to chair with increased time and effort as well as b/l support from therapists for weight shifting. She is SETUP in chair with all needs met and in reach. OT engages pt in below-listed [facility] tasks with L hand (using R hand to mirror). Pt educated on rationale and tolerates each [facility] exercise for 2 sets of 10 reps. Pt is content in chair with alarm in place but no alarm box available in room. RN notified. Will continue to follow acutely.    Recommendations for follow up therapy are one component of a multi-disciplinary discharge planning process, led by the attending physician.  Recommendations may be updated based on patient status, additional functional criteria and insurance authorization.    Follow Up Recommendations  Skilled nursing-short term rehab (<3 hours/day)    Assistance Recommended at Discharge Intermittent Supervision/Assistance  Patient can return home with the following  A lot of help with walking and/or transfers;A lot of help with bathing/dressing/bathroom;Direct supervision/assist for medications management;Direct supervision/assist for financial management;Assistance with cooking/housework;Assist for transportation;Help with stairs or ramp for  entrance   Equipment Recommendations  BSC/3in1;Other (comment) (2ww)    Recommendations for Other Services      Precautions / Restrictions Precautions Precautions: Fall Restrictions Weight Bearing Restrictions: No       Mobility Bed Mobility               General bed mobility comments: up with PTA when OT arrives for tx    Transfers Overall transfer level: Needs assistance Equipment used: Rolling walker (2 wheels) Transfers: Sit to/from Stand Sit to Stand: Min assist, +2 physical assistance           General transfer comment: requires extensive steadying assist but demos good lift off.     Balance Overall balance assessment: Needs assistance Sitting-balance support: Feet supported, Single extremity supported, No upper extremity supported Sitting balance-Leahy Scale: Fair       Standing balance-Leahy Scale: Poor                             ADL either performed or assessed with clinical judgement   ADL Overall ADL's : Needs assistance/impaired         Upper Body Bathing: Set up;Sitting                   Toileting- Clothing Manipulation and Hygiene: Moderate assistance;Sit to/from stand Toileting - Clothing Manipulation Details (indicate cue type and reason): Pt able to make more meaningful contribution to standing peri care today.            Extremity/Trunk Assessment  Vision       Perception     Praxis      Cognition Arousal/Alertness: Awake/alert Behavior During Therapy: WFL for tasks assessed/performed Overall Cognitive Status: Within Functional Limits for tasks assessed                                 General Comments: some slow processing/response, but generally appropriate throughout session and mostly oriented except for date.        Exercises Other Exercises Other Exercises: OT engages pt in seated [place] tasks incluidng opposition, digit flexion/extension, digit isolation, and  abd/add x10 reps apiece    Shoulder Instructions       General Comments      Pertinent Vitals/ Pain       Pain Assessment Pain Assessment: Faces Faces Pain Scale: Hurts a little bit Pain Location: headache, L temple Pain Descriptors / Indicators: Headache Pain Intervention(s): Limited activity within patient's tolerance, Monitored during session, Repositioned  Home Living                                          Prior Functioning/Environment              Frequency  Min 2X/week        Progress Toward Goals  OT Goals(current goals can now be found in the care plan section)  Progress towards OT goals: Progressing toward goals  Acute Rehab OT Goals Patient Stated Goal: be indep OT Goal Formulation: With patient Time For Goal Achievement: 02/02/22 Potential to Achieve Goals: Good  Plan Discharge plan needs to be updated;Frequency needs to be updated    Co-evaluation    PT/OT/SLP Co-Evaluation/Treatment: Yes Reason for Co-Treatment: Complexity of the patient's impairments (multi-system involvement);For patient/therapist safety;To address functional/ADL transfers PT goals addressed during session: Mobility/safety with mobility OT goals addressed during session: ADL's and self-care      AM-PAC OT "6 Clicks" Daily Activity     Outcome Measure   Help from another person eating meals?: A Little Help from another person taking care of personal grooming?: A Little Help from another person toileting, which includes using toliet, bedpan, or urinal?: A Lot Help from another person bathing (including washing, rinsing, drying)?: A Lot Help from another person to put on and taking off regular upper body clothing?: A Lot Help from another person to put on and taking off regular lower body clothing?: A Lot 6 Click Score: 14    End of Session Equipment Utilized During Treatment: Rolling walker (2 wheels)  OT Visit Diagnosis: Other abnormalities of gait  and mobility (R26.89);Hemiplegia and hemiparesis Hemiplegia - Right/Left: Left Hemiplegia - dominant/non-dominant: Dominant Hemiplegia - caused by: Unspecified   Activity Tolerance Treatment limited secondary to medical complications (Comment)   Patient Left in bed;Other (comment);with call bell/phone within reach   Nurse Communication Other (comment) (nausea)        Time: 2229-7989 OT Time Calculation (min): 12 min  Charges: OT General Charges $OT Visit: 1 Visit OT Treatments $Therapeutic Activity: 8-22 mins  Gerrianne Scale, MS, OTR/L ascom [phone] 01/25/22, 5:27 PM

## 2022-01-25 NOTE — Progress Notes (Signed)
Physical Therapy Treatment Patient Details Name: Morgan [name] MRN: [id number] DOB: 06/19/1951 Today's Date: 01/25/2022   History of Present Illness Pt is a 71 y/o F who presented to the ED with c/o syncope, L sided weakness/heaviness, & L sided facial droop. Code stroke was called & pt received TNK at 1119; pt was admitted to the ICU for further work up & monitoring. Initial MRI was negative for acute processes. PMH: CHF, HTN, mitral valve prolapse, e. 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 Billed as appropriate.  Pt to EOB with min guard today and seemed to have less dizziness overall.  Sitting EOB x 10 minutes with distant supervision and brushing teeth and hair.  OT arrived and Pt is able to stand and attend to pericare in standing then transfer to recliner at bedside with min a x 2 for safety with slow hesitant steps that are generally unsteady but no buckling noted with good control to sit.    Pt tolerated session well and overall seemed to tolerate mobility better with less dizziness and nausea today.  Stated she received meds prior.  She does elect to stay in recliner and with OT after session.  Anticipate pt could have walked further today but it was limited to allow pt to tolerate sitting in chair as she has not been able to tolerate sitting in chair since admission and benefits of being upright out weighted pushing gait at this time.   Recommendations for follow up therapy are one component of a multi-disciplinary discharge planning process, led by the attending physician.  Recommendations may be updated based on patient status, additional functional criteria and insurance authorization.  Follow Up Recommendations  Skilled nursing-short term rehab (<3 hours/day)     Assistance Recommended at Discharge Frequent or constant Supervision/Assistance  Patient can return home with the  following A lot of help with walking and/or transfers;A lot of help with bathing/dressing/bathroom;Assist for transportation;Assistance with cooking/housework;Direct supervision/assist for financial management;Help with stairs or ramp for entrance;Direct supervision/assist for medications management   Equipment Recommendations  None recommended by PT    Recommendations for Other Services       Precautions / Restrictions Precautions Precautions: Fall Restrictions Weight Bearing Restrictions: No     Mobility  Bed Mobility Overal bed mobility: Needs Assistance Bed Mobility: Supine to Sit     Supine to sit: Min guard, HOB elevated          Transfers Overall transfer level: Needs assistance Equipment used: Rolling walker (2 wheels) Transfers: Sit to/from Stand Sit to Stand: Min assist, +2 physical assistance                Ambulation/Gait Ambulation/Gait assistance: Min assist, +2 physical assistance, +2 safety/equipment Gait Distance (Feet): 3 Feet Assistive device: Rolling walker (2 wheels) Gait Pattern/deviations: Step-to pattern Gait velocity: decreased     General Gait Details: steps to chair but limitedd further gait to allow for sitting in chair as she has not been able to tolerate sitting since admission.   Stairs             Wheelchair Mobility    Modified Rankin (Stroke Patients Only)       Balance  Cognition Arousal/Alertness: Awake/alert Behavior During Therapy: WFL for tasks assessed/performed Overall Cognitive Status: Within Functional Limits for tasks assessed                                          Exercises Other Exercises Other Exercises: able to sit EOB and attend to ADL tasks - brushing teeth and hair with set up only    General Comments        Pertinent Vitals/Pain Pain Assessment Pain Assessment: Faces Faces Pain Scale: Hurts a little  bit Pain Location: headache, L temple Pain Descriptors / Indicators: Headache Pain Intervention(s): Limited activity within patient's tolerance, Monitored during session, Repositioned    Home Living                          Prior Function            PT Goals (current goals can now be found in the care plan section) Progress towards PT goals: Progressing toward goals    Frequency    7X/week      PT Plan Current plan remains appropriate    Co-evaluation PT/OT/SLP Co-Evaluation/Treatment: Yes Reason for Co-Treatment: Complexity of the patient's impairments (multi-system involvement) PT goals addressed during session: Mobility/safety with mobility;Balance OT goals addressed during session: ADL's and self-care      AM-PAC PT "6 Clicks" Mobility   Outcome Measure  Help needed turning from your back to your side while in a flat bed without using bedrails?: A Little Help needed moving from lying on your back to sitting on the side of a flat bed without using bedrails?: A Little Help needed moving to and from a bed to a chair (including a wheelchair)?: A Lot Help needed standing up from a chair using your arms (e.g., wheelchair or bedside chair)?: A Lot Help needed to walk in hospital room?: A Lot Help needed climbing 3-5 steps with a railing? : Total 6 Click Score: 13    End of Session Equipment Utilized During Treatment: Gait belt;Oxygen Activity Tolerance: Patient tolerated treatment well;Treatment limited secondary to medical complications (Comment) Patient left: in bed;with bed alarm set;with call bell/phone within reach Nurse Communication: Mobility status PT Visit Diagnosis: Difficulty in walking, not elsewhere classified (R26.2);Muscle weakness (generalized) (M62.81);Hemiplegia and hemiparesis Hemiplegia - Right/Left: Left Hemiplegia - dominant/non-dominant: Dominant Hemiplegia - caused by: Unspecified     Time: 3220-2542 PT Time Calculation (min)  (ACUTE ONLY): 24 min  Charges:  $Gait Training: 8-22 mins $Therapeutic Activity: 8-22 mins                    Chesley Noon, PTA 01/25/22, 10:52 AM

## 2022-01-25 NOTE — Progress Notes (Signed)
PROGRESS NOTE    Morgan [name]  KGM:[id number] DOB: [birth year] DOA: 01/18/2022 PCP: [name], MD   Chief complaint.  Migraine headache. Brief Narrative:  Morgan [name] is a 71 year old female with a past medical history significant for CHF, hypertension, mitral valve prolapse, Parkinson's disease, anemia, and arthritis who presented to [facility] ED on 01/18/2022 due to complaints of syncope, left-sided weakness/heaviness, left-sided facial droop, and left-sided visual field cut while at outpatient clinic getting a renal US.   Pt received TNK during stroke code and was admitted to ICU.  Subsequent MRI brain was neg for acute stroke.  Pt transferred to hospitalist service on 01/20/22.   Assessment & Plan:   Principal Problem:   Acute ischemic stroke [facility]) Active Problems:   Essential hypertension   Chronic respiratory failure with hypoxia (HCC)   Stage 3a chronic kidney disease (HCC)   Hemiplegic migraine   Hemiplegic migraine with persistent left-sided weakness. Stroke ruled out. Patient still has significant left-sided migraine headache, nausea and dizziness. She has been evaluated by neurology, MRI has ruled out a stroke. Patient still has significant migraine headache, I will increase dose of Depakote, continue sumatriptan as needed. Patient currently is pending for nursing placement.  Chronic kidney disease stage IIIa. AKI ruled out Mild hyponatremia Hypokalemia. Condition has been stable.  Had IV potassium yesterday.  Recheck BMP again tomorrow.  COPD  Chronic hypoxemic respiratory failure Condition stable.   Anemia of chronic disease. Continue to monitor.     DVT prophylaxis: Lovenox Code Status: full Family Communication:  Disposition Plan:      Status is: Inpatient Remains inpatient appropriate because: Severity of disease, still symptomatic.  Also pending nursing home placement.          I/O last 3 completed shifts: In: 540 [P.O.:240; IV  Piggyback:300] Out: 1700 [Urine:1700] No intake/output data recorded.     Consultants:  Neurology.  Procedures: none  Antimicrobials: None  Subjective: Patient still complaining of left-sided migraine headache, still has nausea, no vomiting.  She has been tolerating diet. Denies any short of breath or cough. No fever or chills.  Objective: Vitals:   01/24/22 2034 01/25/22 0018 01/25/22 0513 01/25/22 0757  BP: 140/81 (!) 146/87 (!) 143/75 138/73  Pulse: 76 75 72 74  Resp: 16 17 16 18   Temp: 99.1 F (37.3 C) 98.3 F (36.8 C) 98.4 F (36.9 C) 98.3 F (36.8 C)  TempSrc: Oral Oral Oral Oral  SpO2: 98% 98% 98% 97%  Weight:      Height:        Intake/Output Summary (Last 24 hours) at 01/25/2022 1013 Last data filed at 01/25/2022 0513 Gross per 24 hour  Intake 420 ml  Output 1050 ml  Net -630 ml   Filed Weights   01/18/22 1054 01/18/22 1100 01/18/22 1700  Weight: 107.5 kg 107.5 kg 107.6 kg    Examination:  General exam: Appears calm and comfortable  Respiratory system: Clear to auscultation. Respiratory effort normal. Cardiovascular system: S1 & S2 heard, RRR. No JVD, murmurs, rubs, gallops or clicks. No pedal edema. Gastrointestinal system: Abdomen is nondistended, soft and nontender. No organomegaly or masses felt. Normal bowel sounds heard. Central nervous system: Alert and oriented. No focal neurological deficits. Extremities: Symmetric 5 x 5 power. Skin: No rashes, lesions or ulcers Psychiatry: Judgement and insight appear normal. Mood & affect appropriate.     Data Reviewed: I have personally reviewed following labs and imaging studies  CBC: Recent Labs  Lab 01/18/22 1137 01/19/22  5035 01/20/22 0543 01/21/22 0317 01/22/22 0650 01/23/22 0519 01/24/22 0440  WBC 10.9*   < > 5.3 9.1 8.8 9.9 9.8  NEUTROABS 8.9*  --   --   --   --   --   --   HGB 9.2*   < > 10.3* 9.4* 10.0* 10.2* 10.1*  HCT 30.0*   < > 32.1* 29.4* 31.7* 33.1* 32.4*  MCV 86.5   < > 83.4  81.9 83.6 85.1 84.2  PLT 226   < > 269 248 257 250 254   < > = values in this interval not displayed.   Basic Metabolic Panel: Recent Labs  Lab 01/20/22 0543 01/21/22 0317 01/22/22 0650 01/23/22 0519 01/24/22 0440  NA 140 138 138 139 137  K 4.0 3.5 3.5 3.4* 3.2*  CL 102 101 100 100 99  CO2 28 29 31  32 32  GLUCOSE 121* 108* 101* 105* 117*  BUN 29* 30* 20 20 17   CREATININE 1.23* 1.40* 1.25* 1.17* 1.18*  CALCIUM 8.9 8.6* 8.7* 8.9 8.6*  MG 2.1 2.1 1.9 1.9 2.0  PHOS 4.1  --   --   --   --    GFR: Estimated Creatinine Clearance: 63.6 mL/min (A) (by C-G formula based on SCr of 1.18 mg/dL (H)). Liver Function Tests: Recent Labs  Lab 01/18/22 1137  AST 14*  ALT 6  ALKPHOS 108  BILITOT 0.3  PROT 6.1*  ALBUMIN 3.2*   No results for input(s): LIPASE, AMYLASE in the last 168 hours. No results for input(s): AMMONIA in the last 168 hours. Coagulation Profile: Recent Labs  Lab 01/18/22 1137  INR 1.0   Cardiac Enzymes: No results for input(s): CKTOTAL, CKMB, CKMBINDEX, TROPONINI in the last 168 hours. BNP (last 3 results) Recent Labs    08/29/21 0931  PROBNP 180.0*   HbA1C: No results for input(s): HGBA1C in the last 72 hours. CBG: Recent Labs  Lab 01/18/22 1052 01/18/22 1701 01/18/22 2110  GLUCAP 98 76 85   Lipid Profile: No results for input(s): CHOL, HDL, LDLCALC, TRIG, CHOLHDL, LDLDIRECT in the last 72 hours. Thyroid Function Tests: No results for input(s): TSH, T4TOTAL, FREET4, T3FREE, THYROIDAB in the last 72 hours. Anemia Panel: No results for input(s): VITAMINB12, FOLATE, FERRITIN, TIBC, IRON, RETICCTPCT in the last 72 hours. Sepsis Labs: Recent Labs  Lab 01/18/22 1137  PROCALCITON <0.10    Recent Results (from the past 240 hour(s))  Resp Panel by RT-PCR (Flu A&B, Covid) Nasopharyngeal Swab     Status: None   Collection Time: 01/18/22 12:50 PM   Specimen: Nasopharyngeal Swab; Nasopharyngeal(NP) swabs in vial transport medium  Result Value Ref  Range Status   SARS Coronavirus 2 by RT PCR NEGATIVE NEGATIVE Final    Comment: (NOTE) SARS-CoV-2 target nucleic acids are NOT DETECTED.  The SARS-CoV-2 RNA is generally detectable in upper respiratory specimens during the acute phase of infection. The lowest concentration of SARS-CoV-2 viral copies this assay can detect is 138 copies/mL. A negative result does not preclude SARS-Cov-2 infection and should not be used as the sole basis for treatment or other patient management decisions. A negative result may occur with  improper specimen collection/handling, submission of specimen other than nasopharyngeal swab, presence of viral mutation(s) within the areas targeted by this assay, and inadequate number of viral copies(<138 copies/mL). A negative result must be combined with clinical observations, patient history, and epidemiological information. The expected result is Negative.  Fact Sheet for Patients:  EntrepreneurPulse.com.au  Fact Sheet for Healthcare  Providers:  IncredibleEmployment.be  This test is no t yet approved or cleared by the [place] and  has been authorized for detection and/or diagnosis of SARS-CoV-2 by FDA under an Emergency Use Authorization (EUA). This EUA will remain  in effect (meaning this test can be used) for the duration of the COVID-19 declaration under Section 564(b)(1) of the Act, 21 U.S.C.section 360bbb-3(b)(1), unless the authorization is terminated  or revoked sooner.       Influenza A by PCR NEGATIVE NEGATIVE Final   Influenza B by PCR NEGATIVE NEGATIVE Final    Comment: (NOTE) The Xpert Xpress SARS-CoV-2/FLU/RSV plus assay is intended as an aid in the diagnosis of influenza from Nasopharyngeal swab specimens and should not be used as a sole basis for treatment. Nasal washings and aspirates are unacceptable for Xpert Xpress SARS-CoV-2/FLU/RSV testing.  Fact Sheet for  Patients: EntrepreneurPulse.com.au  Fact Sheet for Healthcare Providers: IncredibleEmployment.be  This test is not yet approved or cleared by the [place] FDA and has been authorized for detection and/or diagnosis of SARS-CoV-2 by FDA under an Emergency Use Authorization (EUA). This EUA will remain in effect (meaning this test can be used) for the duration of the COVID-19 declaration under Section 564(b)(1) of the Act, 21 U.S.C. section 360bbb-3(b)(1), unless the authorization is terminated or revoked.  Performed at [facility], [address] [place], [place] [address]   Urine Culture     Status: Abnormal   Collection Time: 01/18/22 12:58 PM   Specimen: Urine, Clean Catch  Result Value Ref Range Status   Specimen Description   Final    URINE, CLEAN CATCH Performed at [facility], [address] [place], [place] [address]    Special Requests   Final    NONE Performed at [facility], [place], [place] [address]    Culture >=100,000 COLONIES/mL ESCHERICHIA COLI (A)  Final   Report Status 01/21/2022 FINAL  Final   Organism ID, Bacteria ESCHERICHIA COLI (A)  Final      Susceptibility   Escherichia coli - MIC*    AMPICILLIN 8 SENSITIVE Sensitive     CEFAZOLIN <=4 SENSITIVE Sensitive     CEFEPIME <=0.12 SENSITIVE Sensitive     CEFTRIAXONE <=0.25 SENSITIVE Sensitive     CIPROFLOXACIN <=0.25 SENSITIVE Sensitive     GENTAMICIN <=1 SENSITIVE Sensitive     IMIPENEM <=0.25 SENSITIVE Sensitive     NITROFURANTOIN <=16 SENSITIVE Sensitive     TRIMETH/SULFA <=20 SENSITIVE Sensitive     AMPICILLIN/SULBACTAM 4 SENSITIVE Sensitive     PIP/TAZO <=4 SENSITIVE Sensitive     * >=100,000 COLONIES/mL ESCHERICHIA COLI  MRSA Next Gen by PCR, Nasal     Status: None   Collection Time: 01/18/22  4:59 PM   Specimen: Nasal Mucosa; Nasal Swab  Result Value Ref Range Status   MRSA by PCR Next Gen NOT DETECTED  NOT DETECTED Final    Comment: (NOTE) The GeneXpert MRSA Assay (FDA approved for NASAL specimens only), is one component of a comprehensive MRSA colonization surveillance program. It is not intended to diagnose MRSA infection nor to guide or monitor treatment for MRSA infections. Test performance is not FDA approved in patients less than 20 years old. Performed at [facility], [address] [place], [place] [address]          Radiology Studies: No results found.      Scheduled Meds:  aspirin  81 mg Oral Daily   divalproex  500 mg Oral Q12H   enoxaparin (LOVENOX) injection  40 mg Subcutaneous Q24H   pantoprazole (PROTONIX) IV  40 mg Intravenous QHS   potassium chloride  40 mEq Oral Once   Continuous Infusions:   LOS: 7 days    Time spent: 27 minutes    Sharen Hones, MD [facility]   To contact the attending provider between 7A-7P or the covering provider during after hours 7P-7A, please log into the web site [email or web address] and access using universal [place] password for that web site. If you do not have the password, please call the hospital operator.  01/25/2022, 10:13 AM

## 2022-01-26 LAB — BASIC METABOLIC PANEL
Anion gap: 7 (ref 5–15)
BUN: 20 mg/dL (ref 8–23)
CO2: 33 mmol/L — ABNORMAL HIGH (ref 22–32)
Calcium: 9.1 mg/dL (ref 8.9–10.3)
Chloride: 99 mmol/L (ref 98–111)
Creatinine, Ser: 1.03 mg/dL — ABNORMAL HIGH (ref 0.44–1.00)
GFR, Estimated: 59 mL/min — ABNORMAL LOW (ref >60)
Glucose, Bld: 104 mg/dL — ABNORMAL HIGH (ref 70–99)
Potassium: 3.5 mmol/L (ref 3.5–5.1)
Sodium: 139 mmol/L (ref 135–145)

## 2022-01-26 LAB — CBC
HCT: 31.9 % — ABNORMAL LOW (ref 36.0–46.0)
Hemoglobin: 9.9 g/dL — ABNORMAL LOW (ref 12.0–15.0)
MCH: 26.3 pg (ref 26.0–34.0)
MCHC: 31 g/dL (ref 30.0–36.0)
MCV: 84.8 fL (ref 80.0–100.0)
Platelets: 245 10*3/uL (ref 150–400)
RBC: 3.76 MIL/uL — ABNORMAL LOW (ref 3.87–5.11)
RDW: 14.5 % (ref 11.5–15.5)
WBC: 10 10*3/uL (ref 4.0–10.5)
nRBC: 0 % (ref 0.0–0.2)

## 2022-01-26 LAB — MAGNESIUM: Magnesium: 2 mg/dL (ref 1.7–2.4)

## 2022-01-26 MED ORDER — POTASSIUM CHLORIDE 20 MEQ PO PACK
40.0000 meq | PACK | Freq: Once | ORAL | Status: AC
  Administered 2022-01-26: 09:00:00 40 meq via ORAL
  Filled 2022-01-26: qty 2

## 2022-01-26 MED ORDER — PANTOPRAZOLE SODIUM 40 MG PO TBEC
40.0000 mg | DELAYED_RELEASE_TABLET | Freq: Every day | ORAL | Status: DC
  Administered 2022-01-26 – 2022-01-29 (×4): 40 mg via ORAL
  Filled 2022-01-26 (×4): qty 1

## 2022-01-26 NOTE — Care Management Important Message (Signed)
Important Message  Patient Details  Name: Morgan [name] MRN: [id number] Date of Birth: 21-Jun-1951   Medicare Important Message Given:  Yes     [name] 01/26/2022, 11:41 AM

## 2022-01-26 NOTE — Progress Notes (Signed)
Physical Therapy Treatment Patient Details Name: Morgan [name] MRN: [id number] DOB: 04/20/1951 Today's Date: 01/26/2022   History of Present Illness Pt is a 71 y/o F who presented to the ED with c/o syncope, L sided weakness/heaviness, & L sided facial droop. Code stroke was called & pt received TNK at 1119; pt was admitted to the ICU for further work up & monitoring. Initial MRI was negative for acute processes. PMH: CHF, HTN, mitral valve prolapse, e. coli UTIs, PD, anemia, arthritis, COPD on 2L, CKD, BPPV    PT Comments    Pt seen for PT tx with pt reporting nausea (nurse in room to administer meds) & HA with pt requesting to get back to bed but use bathroom first. Pt completes stand pivot recliner>BSC with min assist with cuing for hand placement & sequencing. Pt is able to manage brief with min assist & pt with continent BM but electing to perform peri hygiene with non dominant hand. Pt then completes step transfer to bed with min assist & ongoing cuing for hand placement. Pt declines gait 2/2 not feeling well. Educated pt on need to use BSC vs purewick & pt agreeable. Will continue to follow pt acutely to address balance, endurance, gait, strength & safety with mobility.    Recommendations for follow up therapy are one component of a multi-disciplinary discharge planning process, led by the attending physician.  Recommendations may be updated based on patient status, additional functional criteria and insurance authorization.  Follow Up Recommendations  Skilled nursing-short term rehab (<3 hours/day)     Assistance Recommended at Discharge Frequent or constant Supervision/Assistance  Patient can return home with the following A lot of help with walking and/or transfers;A lot of help with bathing/dressing/bathroom;Assist for transportation;Assistance with cooking/housework;Direct supervision/assist for financial management;Help with stairs or ramp for entrance;Direct supervision/assist for  medications management   Equipment Recommendations  None recommended by PT    Recommendations for Other Services       Precautions / Restrictions Precautions Precautions: Fall Restrictions Weight Bearing Restrictions: No     Mobility  Bed Mobility Overal bed mobility: Needs Assistance         Sit to supine: Modified independent (Device/Increase time), HOB elevated        Transfers Overall transfer level: Needs assistance Equipment used: None Transfers: Sit to/from Stand, Bed to chair/wheelchair/BSC Sit to Stand: Min assist Stand pivot transfers: Min assist Step pivot transfers: Min assist            Ambulation/Gait Ambulation/Gait assistance:  (pt declines)                 Stairs             Wheelchair Mobility    Modified Rankin (Stroke Patients Only)       Balance Overall balance assessment: Needs assistance Sitting-balance support: Feet supported, Single extremity supported Sitting balance-Leahy Scale: Fair Sitting balance - Comments: static sitting on BSC without LOB       Standing balance comment: 1UE support & min assist during transfers                            Cognition Arousal/Alertness: Awake/alert Behavior During Therapy: WFL for tasks assessed/performed Overall Cognitive Status: Within Functional Limits for tasks assessed  General Comments: keeps eyes closed throughout majority of session        Exercises      General Comments General comments (skin integrity, edema, etc.): Pt elects to be on 2L/min via nasal cannula for comfort. Pt with continent small BM on BSC, performing peri hygiene with non dominant hand without physical assistance. Educated pt & nurse on no need for purewick but instead for pt to transfer to [facility] for toileting needs.      Pertinent Vitals/Pain Pain Assessment Pain Assessment: Faces Faces Pain Scale: Hurts whole lot Pain  Location: headache Pain Descriptors / Indicators: Headache Pain Intervention(s): Limited activity within patient's tolerance, Monitored during session    Home Living                          Prior Function            PT Goals (current goals can now be found in the care plan section) Acute Rehab PT Goals Patient Stated Goal: get better PT Goal Formulation: With patient Time For Goal Achievement: 02/02/22 Potential to Achieve Goals: Fair Progress towards PT goals: Progressing toward goals    Frequency    7X/week      PT Plan Current plan remains appropriate    Co-evaluation              AM-PAC PT "6 Clicks" Mobility   Outcome Measure  Help needed turning from your back to your side while in a flat bed without using bedrails?: None Help needed moving from lying on your back to sitting on the side of a flat bed without using bedrails?: A Little Help needed moving to and from a bed to a chair (including a wheelchair)?: A Little Help needed standing up from a chair using your arms (e.g., wheelchair or bedside chair)?: A Little Help needed to walk in hospital room?: A Lot Help needed climbing 3-5 steps with a railing? : Total 6 Click Score: 16    End of Session Equipment Utilized During Treatment: Gait belt;Oxygen Activity Tolerance: Patient tolerated treatment well;Patient limited by pain;Treatment limited secondary to medical complications (Comment) (limited 2/2 nausea) Patient left: in bed;with call bell/phone within reach;with bed alarm set Nurse Communication: Mobility status PT Visit Diagnosis: Difficulty in walking, not elsewhere classified (R26.2);Muscle weakness (generalized) (M62.81);Hemiplegia and hemiparesis Hemiplegia - Right/Left: Left Hemiplegia - dominant/non-dominant: Dominant Hemiplegia - caused by: Unspecified     Time: 8916-9450 PT Time Calculation (min) (ACUTE ONLY): 12 min  Charges:  $Therapeutic Activity: 8-22 mins                      Lavone Nian, PT, DPT 01/26/22, 2:55 PM    [name] 01/26/2022, 2:53 PM

## 2022-01-26 NOTE — Progress Notes (Signed)
Occupational Therapy Treatment Patient Details Name: Morgan [name] MRN: [id number] DOB: 17-Jun-1951 Today's Date: 01/26/2022   History of present illness Pt is a 71 y/o F who presented to the ED with c/o syncope, L sided weakness/heaviness, & L sided facial droop. Code stroke was called & pt received TNK at 1119; pt was admitted to the ICU for further work up & monitoring. Initial MRI was negative for acute processes. PMH: CHF, HTN, mitral valve prolapse, e. coli UTIs, PD, anemia, arthritis, COPD on 2L, CKD, BPPV   OT comments  Pt seen for OT tx this date. Pt reports just recently back to bed and declines EOB/OOB again at this time 2/2 nausea however reports having just received medications which are beginning to work. Pt agreeable to bed level session focused on visual scanning activity and BUE FMC/GMC ex. Pt instructed in BUE strengthening ex incorporating trunk rotation and LUE FMC activities to perform with VC for maximizing quality of movement given pt's PD dx. Pt also instructed in visual scanning activity to L side and afterwards, tray table set up on L side to encourage visual attention and scanning to L side. Pt progressing towards OT goals slowly, primarily hindered by ongoing pain (headaches) and nausea. SNF remains appropriate at this time.    Recommendations for follow up therapy are one component of a multi-disciplinary discharge planning process, led by the attending physician.  Recommendations may be updated based on patient status, additional functional criteria and insurance authorization.    Follow Up Recommendations  Skilled nursing-short term rehab (<3 hours/day)    Assistance Recommended at Discharge Intermittent Supervision/Assistance  Patient can return home with the following  A lot of help with walking and/or transfers;A lot of help with bathing/dressing/bathroom;Assistance with cooking/housework;Assist for transportation;Help with stairs or ramp for entrance;Direct  supervision/assist for medications management   Equipment Recommendations  BSC/3in1;Other (comment) (2WW)    Recommendations for Other Services      Precautions / Restrictions Precautions Precautions: Fall Restrictions Weight Bearing Restrictions: No       Mobility Bed Mobility                    Transfers                         Balance                                           ADL either performed or assessed with clinical judgement   ADL                                              Extremity/Trunk Assessment              Vision   Additional Comments: pt continues to endorse L visual field deficits but improving slightly per pt report   Perception     Praxis      Cognition Arousal/Alertness: Awake/alert Behavior During Therapy: WFL for tasks assessed/performed Overall Cognitive Status: Within Functional Limits for tasks assessed  Exercises Other Exercises Other Exercises: Pt instructed in visual scanning activity to L side and afterwards, tray table set up on L side to encourage visual attention and scanning to L side Other Exercises: Pt instructed in BUE ex and [place] ex for L hand wiht pt able to return demo, with VC for maximizing quality of movement given pt's PD dx    Shoulder Instructions       General Comments Pt elects to be on 2L/min via nasal cannula for comfort. Pt with continent small BM on BSC, performing peri hygiene with non dominant hand without physical assistance. Educated pt & nurse on no need for purewick but instead for pt to transfer to [facility] for toileting needs.    Pertinent Vitals/ Pain       Pain Assessment Pain Assessment: Faces Faces Pain Scale: Hurts little more Pain Location: headache, L temple Pain Descriptors / Indicators: Headache Pain Intervention(s): Limited activity within patient's tolerance, Monitored  during session, Premedicated before session, Repositioned  Home Living                                          Prior Functioning/Environment              Frequency  Min 2X/week        Progress Toward Goals  OT Goals(current goals can now be found in the care plan section)  Progress towards OT goals: Progressing toward goals  Acute Rehab OT Goals Patient Stated Goal: be indep OT Goal Formulation: With patient Time For Goal Achievement: 02/02/22 Potential to Achieve Goals: Good  Plan Discharge plan remains appropriate;Frequency remains appropriate    Co-evaluation                 AM-PAC OT "6 Clicks" Daily Activity     Outcome Measure   Help from another person eating meals?: A Little Help from another person taking care of personal grooming?: A Little Help from another person toileting, which includes using toliet, bedpan, or urinal?: A Lot Help from another person bathing (including washing, rinsing, drying)?: A Lot Help from another person to put on and taking off regular upper body clothing?: A Lot Help from another person to put on and taking off regular lower body clothing?: A Lot 6 Click Score: 14    End of Session    OT Visit Diagnosis: Other abnormalities of gait and mobility (R26.89);Hemiplegia and hemiparesis Hemiplegia - Right/Left: Left Hemiplegia - dominant/non-dominant: Dominant Hemiplegia - caused by: Unspecified   Activity Tolerance Patient tolerated treatment well   Patient Left in bed;with call bell/phone within reach   Nurse Communication          Time: 6283-6629 OT Time Calculation (min): 20 min  Charges: OT General Charges $OT Visit: 1 Visit OT Treatments $Therapeutic Activity: 8-22 mins  Ardeth Perfect., MPH, MS, OTR/L ascom [phone] 01/26/22, 3:30 PM

## 2022-01-26 NOTE — Progress Notes (Signed)
PROGRESS NOTE    Morgan [name]  VQQ:[id number] DOB: [birth year] DOA: 01/18/2022 PCP: [name], MD    Brief Narrative:  Morgan [name] is a 71 year old female with a past medical history significant for CHF, hypertension, mitral valve prolapse, Parkinson's disease, anemia, and arthritis who presented to [facility] ED on 01/18/2022 due to complaints of syncope, left-sided weakness/heaviness, left-sided facial droop, and left-sided visual field cut while at outpatient clinic getting a renal US.   Morgan [name] received TNK during stroke code and was admitted to ICU.  Subsequent MRI brain was neg for acute stroke.  Morgan [name] transferred to hospitalist service on 01/20/22.  Morgan [name] was seen by neurology, Morgan [name] did not have a stroke.  She has hemiplegic migraine.   Assessment & Plan:   Principal Problem:   Acute ischemic stroke [facility]) Active Problems:   Essential hypertension   Chronic respiratory failure with hypoxia (HCC)   Stage 3a chronic kidney disease (HCC)   Hemiplegic migraine   Hemiplegic migraine with persistent left-sided weakness. Stroke ruled out. She has been evaluated by neurology, MRI has ruled out a stroke. Finally improving after taking increased dose of Depakote.  Currently pending for nursing home placement.  Chronic kidney disease stage IIIa. AKI ruled out Mild hyponatremia Hypokalemia. Condition has improved.  COPD  Chronic hypoxemic respiratory failure Condition stable.   Anemia of chronic disease. Continue to monitor.     DVT prophylaxis: Lovenox Code Status: full Family Communication:  Disposition Plan:      Status is: Inpatient Remains inpatient appropriate because: Severity of disease, still symptomatic.  Also pending nursing home placement.      I/O last 3 completed shifts: In: 120 [P.O.:120] Out: 2050 [Urine:2050] Total I/O In: -  Out: 400 [Urine:400]     Consultants:  Neurology  Procedures: None  Antimicrobials: None  Subjective: Morgan [name] still  has some nausea, but much better.  Tolerating diet without vomiting. Headache much improved. No shortness of breath or cough.  Objective: Vitals:   01/25/22 2035 01/26/22 0555 01/26/22 0706 01/26/22 1113  BP: 139/80 (!) 153/73 131/73 139/75  Pulse: 77 73 74 73  Resp: 18 17 20 18   Temp: 98.2 F (36.8 C) 98.2 F (36.8 C) 98.2 F (36.8 C) 98.2 F (36.8 C)  TempSrc: Oral Oral Oral Oral  SpO2: 98% 100% 99% 100%  Weight:      Height:        Intake/Output Summary (Last 24 hours) at 01/26/2022 1251 Last data filed at 01/26/2022 1000 Gross per 24 hour  Intake 120 ml  Output 1000 ml  Net -880 ml   Filed Weights   01/18/22 1054 01/18/22 1100 01/18/22 1700  Weight: 107.5 kg 107.5 kg 107.6 kg    Examination:  General exam: Appears calm and comfortable  Respiratory system: Clear to auscultation. Respiratory effort normal. Cardiovascular system: S1 & S2 heard, RRR. No JVD, murmurs, rubs, gallops or clicks. No pedal edema. Gastrointestinal system: Abdomen is nondistended, soft and nontender. No organomegaly or masses felt. Normal bowel sounds heard. Central nervous system: Alert and oriented. No focal neurological deficits. Extremities: Symmetric 5 x 5 power. Skin: No rashes, lesions or ulcers Psychiatry: Judgement and insight appear normal. Mood & affect appropriate.     Data Reviewed: I have personally reviewed following labs and imaging studies  CBC: Recent Labs  Lab 01/21/22 0317 01/22/22 0650 01/23/22 0519 01/24/22 0440 01/26/22 0356  WBC 9.1 8.8 9.9 9.8 10.0  HGB 9.4* 10.0* 10.2* 10.1* 9.9*  HCT 29.4* 31.7* 33.1*  32.4* 31.9*  MCV 81.9 83.6 85.1 84.2 84.8  PLT 248 257 250 254 885   Basic Metabolic Panel: Recent Labs  Lab 01/20/22 0543 01/21/22 0317 01/22/22 0650 01/23/22 0519 01/24/22 0440 01/26/22 0356  NA 140 138 138 139 137 139  K 4.0 3.5 3.5 3.4* 3.2* 3.5  CL 102 101 100 100 99 99  CO2 28 29 31  32 32 33*  GLUCOSE 121* 108* 101* 105* 117* 104*  BUN  29* 30* 20 20 17 20   CREATININE 1.23* 1.40* 1.25* 1.17* 1.18* 1.03*  CALCIUM 8.9 8.6* 8.7* 8.9 8.6* 9.1  MG 2.1 2.1 1.9 1.9 2.0 2.0  PHOS 4.1  --   --   --   --   --    GFR: Estimated Creatinine Clearance: 72.9 mL/min (A) (by C-G formula based on SCr of 1.03 mg/dL (H)). Liver Function Tests: No results for input(s): AST, ALT, ALKPHOS, BILITOT, PROT, ALBUMIN in the last 168 hours. No results for input(s): LIPASE, AMYLASE in the last 168 hours. No results for input(s): AMMONIA in the last 168 hours. Coagulation Profile: No results for input(s): INR, PROTIME in the last 168 hours. Cardiac Enzymes: No results for input(s): CKTOTAL, CKMB, CKMBINDEX, TROPONINI in the last 168 hours. BNP (last 3 results) Recent Labs    08/29/21 0931  PROBNP 180.0*   HbA1C: No results for input(s): HGBA1C in the last 72 hours. CBG: No results for input(s): GLUCAP in the last 168 hours. Lipid Profile: No results for input(s): CHOL, HDL, LDLCALC, TRIG, CHOLHDL, LDLDIRECT in the last 72 hours. Thyroid Function Tests: No results for input(s): TSH, T4TOTAL, FREET4, T3FREE, THYROIDAB in the last 72 hours. Anemia Panel: No results for input(s): VITAMINB12, FOLATE, FERRITIN, TIBC, IRON, RETICCTPCT in the last 72 hours. Sepsis Labs: No results for input(s): PROCALCITON, LATICACIDVEN in the last 168 hours.  Recent Results (from the past 240 hour(s))  Resp Panel by RT-PCR (Flu A&B, Covid) Nasopharyngeal Swab     Status: None   Collection Time: 01/18/22 12:50 PM   Specimen: Nasopharyngeal Swab; Nasopharyngeal(NP) swabs in vial transport medium  Result Value Ref Range Status   SARS Coronavirus 2 by RT PCR NEGATIVE NEGATIVE Final    Comment: (NOTE) SARS-CoV-2 target nucleic acids are NOT DETECTED.  The SARS-CoV-2 RNA is generally detectable in upper respiratory specimens during the acute phase of infection. The lowest concentration of SARS-CoV-2 viral copies this assay can detect is 138 copies/mL. A negative  result does not preclude SARS-Cov-2 infection and should not be used as the sole basis for treatment or other Morgan [name] management decisions. A negative result may occur with  improper specimen collection/handling, submission of specimen other than nasopharyngeal swab, presence of viral mutation(s) within the areas targeted by this assay, and inadequate number of viral copies(<138 copies/mL). A negative result must be combined with clinical observations, Morgan [name] history, and epidemiological information. The expected result is Negative.  Fact Sheet for Patients:  EntrepreneurPulse.com.au  Fact Sheet for Healthcare Providers:  IncredibleEmployment.be  This test is no t yet approved or cleared by the [place] FDA and  has been authorized for detection and/or diagnosis of SARS-CoV-2 by FDA under an Emergency Use Authorization (EUA). This EUA will remain  in effect (meaning this test can be used) for the duration of the COVID-19 declaration under Section 564(b)(1) of the Act, 21 U.S.C.section 360bbb-3(b)(1), unless the authorization is terminated  or revoked sooner.       Influenza A by PCR NEGATIVE NEGATIVE Final   Influenza  B by PCR NEGATIVE NEGATIVE Final    Comment: (NOTE) The Xpert Xpress SARS-CoV-2/FLU/RSV plus assay is intended as an aid in the diagnosis of influenza from Nasopharyngeal swab specimens and should not be used as a sole basis for treatment. Nasal washings and aspirates are unacceptable for Xpert Xpress SARS-CoV-2/FLU/RSV testing.  Fact Sheet for Patients: EntrepreneurPulse.com.au  Fact Sheet for Healthcare Providers: IncredibleEmployment.be  This test is not yet approved or cleared by the [place] FDA and has been authorized for detection and/or diagnosis of SARS-CoV-2 by FDA under an Emergency Use Authorization (EUA). This EUA will remain in effect (meaning this test can be used)  for the duration of the COVID-19 declaration under Section 564(b)(1) of the Act, 21 U.S.C. section 360bbb-3(b)(1), unless the authorization is terminated or revoked.  Performed at [facility], [address] [place], [place] [address]   Urine Culture     Status: Abnormal   Collection Time: 01/18/22 12:58 PM   Specimen: Urine, Clean Catch  Result Value Ref Range Status   Specimen Description   Final    URINE, CLEAN CATCH Performed at [facility], [address] [place], [place] [address]    Special Requests   Final    NONE Performed at [facility], [place], [place] [address]    Culture >=100,000 COLONIES/mL ESCHERICHIA COLI (A)  Final   Report Status 01/21/2022 FINAL  Final   Organism ID, Bacteria ESCHERICHIA COLI (A)  Final      Susceptibility   Escherichia coli - MIC*    AMPICILLIN 8 SENSITIVE Sensitive     CEFAZOLIN <=4 SENSITIVE Sensitive     CEFEPIME <=0.12 SENSITIVE Sensitive     CEFTRIAXONE <=0.25 SENSITIVE Sensitive     CIPROFLOXACIN <=0.25 SENSITIVE Sensitive     GENTAMICIN <=1 SENSITIVE Sensitive     IMIPENEM <=0.25 SENSITIVE Sensitive     NITROFURANTOIN <=16 SENSITIVE Sensitive     TRIMETH/SULFA <=20 SENSITIVE Sensitive     AMPICILLIN/SULBACTAM 4 SENSITIVE Sensitive     PIP/TAZO <=4 SENSITIVE Sensitive     * >=100,000 COLONIES/mL ESCHERICHIA COLI  MRSA Next Gen by PCR, Nasal     Status: None   Collection Time: 01/18/22  4:59 PM   Specimen: Nasal Mucosa; Nasal Swab  Result Value Ref Range Status   MRSA by PCR Next Gen NOT DETECTED NOT DETECTED Final    Comment: (NOTE) The GeneXpert MRSA Assay (FDA approved for NASAL specimens only), is one component of a comprehensive MRSA colonization surveillance program. It is not intended to diagnose MRSA infection nor to guide or monitor treatment for MRSA infections. Test performance is not FDA approved in patients less than 86 years old. Performed at [facility], [address] [place], [place] [address]          Radiology Studies: No results found.      Scheduled Meds:  aspirin  81 mg Oral Daily   divalproex  500 mg Oral Q12H   enoxaparin (LOVENOX) injection  40 mg Subcutaneous Q24H   pantoprazole  40 mg Oral QHS   Continuous Infusions:   LOS: 8 days    Time spent: 27 minutes    Sharen Hones, MD [facility]   To contact the attending provider between 7A-7P or the covering provider during after hours 7P-7A, please log into the web site [email or web address] and access using universal [place] password for that web site. If you do not have the password, please call the  hospital operator.  01/26/2022, 12:51 PM

## 2022-01-26 NOTE — Progress Notes (Signed)
PHARMACIST - PHYSICIAN COMMUNICATION   CONCERNING: IV to Oral Route Change Policy  RECOMMENDATION: This patient is receiving pantoprazole by the intravenous route.  Based on criteria approved by the Pharmacy and Therapeutics Committee, the intravenous medication(s) is/are being converted to the equivalent oral dose form(s).   DESCRIPTION: These criteria include: The patient is eating (either orally or via tube) and/or has been taking other orally administered medications for a least 24 hours The patient has no evidence of active gastrointestinal bleeding or impaired GI absorption (gastrectomy, short bowel, patient on TNA or NPO).  If you have questions about this conversion, please contact the [place], PharmD, BCPS Clinical Pharmacist 01/26/2022 8:36 AM

## 2022-01-27 MED ORDER — QUETIAPINE FUMARATE 25 MG PO TABS
25.0000 mg | ORAL_TABLET | Freq: Every day | ORAL | Status: DC
  Administered 2022-01-27 – 2022-01-29 (×3): 25 mg via ORAL
  Filled 2022-01-27 (×3): qty 1

## 2022-01-27 MED ORDER — POTASSIUM CHLORIDE CRYS ER 20 MEQ PO TBCR
40.0000 meq | EXTENDED_RELEASE_TABLET | Freq: Once | ORAL | Status: AC
  Administered 2022-01-27: 10:00:00 40 meq via ORAL
  Filled 2022-01-27: qty 2

## 2022-01-27 NOTE — Progress Notes (Addendum)
PROGRESS NOTE    Morgan [name]  KNL:[id number] DOB: 09-Sep-1951 DOA: 01/18/2022 PCP: [name], MD    Brief Narrative:  Morgan [name] is a 71 year old female with a past medical history significant for CHF, hypertension, mitral valve prolapse, Parkinson's disease, anemia, and arthritis who presented to [facility] ED on 01/18/2022 due to complaints of syncope, left-sided weakness/heaviness, left-sided facial droop, and left-sided visual field cut while at outpatient clinic getting a renal US.   Pt received TNK during stroke code and was admitted to ICU.  Subsequent MRI brain was neg for acute stroke.  Pt transferred to hospitalist service on 01/20/22.   Patient was seen by neurology, patient did not have a stroke.  She has hemiplegic migraine.   Assessment & Plan:   Principal Problem:   Acute ischemic stroke [facility]) Active Problems:   Essential hypertension   Chronic respiratory failure with hypoxia (HCC)   Stage 3a chronic kidney disease (HCC)   Hemiplegic migraine   Hemiplegic migraine with persistent left-sided weakness. Stroke ruled out. She has been evaluated by neurology, MRI has ruled out a stroke. Patient had another episode of migraine headache this morning, continue as needed sumatriptan.  Patient could not sleep last night, will add Seroquel 25 mg every evening.  Chronic kidney disease stage IIIa. AKI ruled out Mild hyponatremia Hypokalemia. Condition has improved.  Given 40 mEq oral potassium for K of 3.5.  COPD  Chronic hypoxemic respiratory failure No bronchospasm.   Anemia of chronic disease. Continue to monitor.     DVT prophylaxis: Lovenox Code Status: full Family Communication:  Disposition Plan: Pending nursing home placement.     Status is: Inpatient Remains inpatient appropriate because: Severity of disease, still symptomatic.  Also pending nursing home placement.      I/O last 3 completed shifts: In: -  Out: 1200 [Urine:1200] Total I/O In: 240  [P.O.:240] Out: -       Subjective: Patient had another episode of migraine headache this morning, nausea vomiting has resolved. Good appetite.   She could not sleep last night.  Objective: Vitals:   01/26/22 1113 01/26/22 2128 01/27/22 0408 01/27/22 0738  BP: 139/75 131/67 132/70 129/78  Pulse: 73 74 71 70  Resp: 18 18 16 16   Temp: 98.2 F (36.8 C) 98 F (36.7 C) 97.8 F (36.6 C) 98.1 F (36.7 C)  TempSrc: Oral Oral Oral Oral  SpO2: 100% 98% 99% 99%  Weight:      Height:        Intake/Output Summary (Last 24 hours) at 01/27/2022 1203 Last data filed at 01/27/2022 0900 Gross per 24 hour  Intake 240 ml  Output 400 ml  Net -160 ml   Filed Weights   01/18/22 1054 01/18/22 1100 01/18/22 1700  Weight: 107.5 kg 107.5 kg 107.6 kg    Examination:  General exam: Appears calm and comfortable  Respiratory system: Clear to auscultation. Respiratory effort normal. Cardiovascular system: S1 & S2 heard, RRR. No JVD, murmurs, rubs, gallops or clicks. No pedal edema. Gastrointestinal system: Abdomen is nondistended, soft and nontender. No organomegaly or masses felt. Normal bowel sounds heard. Central nervous system: Alert and oriented. No focal neurological deficits. Extremities: Symmetric 5 x 5 power. Skin: No rashes, lesions or ulcers Psychiatry: Judgement and insight appear normal. Mood & affect appropriate.     Data Reviewed: I have personally reviewed following labs and imaging studies  CBC: Recent Labs  Lab 01/21/22 0317 01/22/22 0650 01/23/22 0519 01/24/22 0440 01/26/22 0356  WBC  9.1 8.8 9.9 9.8 10.0  HGB 9.4* 10.0* 10.2* 10.1* 9.9*  HCT 29.4* 31.7* 33.1* 32.4* 31.9*  MCV 81.9 83.6 85.1 84.2 84.8  PLT 248 257 250 254 537   Basic Metabolic Panel: Recent Labs  Lab 01/21/22 0317 01/22/22 0650 01/23/22 0519 01/24/22 0440 01/26/22 0356  NA 138 138 139 137 139  K 3.5 3.5 3.4* 3.2* 3.5  CL 101 100 100 99 99  CO2 29 31 32 32 33*  GLUCOSE 108* 101* 105*  117* 104*  BUN 30* 20 20 17 20   CREATININE 1.40* 1.25* 1.17* 1.18* 1.03*  CALCIUM 8.6* 8.7* 8.9 8.6* 9.1  MG 2.1 1.9 1.9 2.0 2.0   GFR: Estimated Creatinine Clearance: 72.9 mL/min (A) (by C-G formula based on SCr of 1.03 mg/dL (H)). Liver Function Tests: No results for input(s): AST, ALT, ALKPHOS, BILITOT, PROT, ALBUMIN in the last 168 hours. No results for input(s): LIPASE, AMYLASE in the last 168 hours. No results for input(s): AMMONIA in the last 168 hours. Coagulation Profile: No results for input(s): INR, PROTIME in the last 168 hours. Cardiac Enzymes: No results for input(s): CKTOTAL, CKMB, CKMBINDEX, TROPONINI in the last 168 hours. BNP (last 3 results) Recent Labs    08/29/21 0931  PROBNP 180.0*   HbA1C: No results for input(s): HGBA1C in the last 72 hours. CBG: No results for input(s): GLUCAP in the last 168 hours. Lipid Profile: No results for input(s): CHOL, HDL, LDLCALC, TRIG, CHOLHDL, LDLDIRECT in the last 72 hours. Thyroid Function Tests: No results for input(s): TSH, T4TOTAL, FREET4, T3FREE, THYROIDAB in the last 72 hours. Anemia Panel: No results for input(s): VITAMINB12, FOLATE, FERRITIN, TIBC, IRON, RETICCTPCT in the last 72 hours. Sepsis Labs: No results for input(s): PROCALCITON, LATICACIDVEN in the last 168 hours.  Recent Results (from the past 240 hour(s))  Resp Panel by RT-PCR (Flu A&B, Covid) Nasopharyngeal Swab     Status: None   Collection Time: 01/18/22 12:50 PM   Specimen: Nasopharyngeal Swab; Nasopharyngeal(NP) swabs in vial transport medium  Result Value Ref Range Status   SARS Coronavirus 2 by RT PCR NEGATIVE NEGATIVE Final    Comment: (NOTE) SARS-CoV-2 target nucleic acids are NOT DETECTED.  The SARS-CoV-2 RNA is generally detectable in upper respiratory specimens during the acute phase of infection. The lowest concentration of SARS-CoV-2 viral copies this assay can detect is 138 copies/mL. A negative result does not preclude  SARS-Cov-2 infection and should not be used as the sole basis for treatment or other patient management decisions. A negative result may occur with  improper specimen collection/handling, submission of specimen other than nasopharyngeal swab, presence of viral mutation(s) within the areas targeted by this assay, and inadequate number of viral copies(<138 copies/mL). A negative result must be combined with clinical observations, patient history, and epidemiological information. The expected result is Negative.  Fact Sheet for Patients:  EntrepreneurPulse.com.au  Fact Sheet for Healthcare Providers:  IncredibleEmployment.be  This test is no t yet approved or cleared by the [place] FDA and  has been authorized for detection and/or diagnosis of SARS-CoV-2 by FDA under an Emergency Use Authorization (EUA). This EUA will remain  in effect (meaning this test can be used) for the duration of the COVID-19 declaration under Section 564(b)(1) of the Act, 21 U.S.C.section 360bbb-3(b)(1), unless the authorization is terminated  or revoked sooner.       Influenza A by PCR NEGATIVE NEGATIVE Final   Influenza B by PCR NEGATIVE NEGATIVE Final    Comment: (NOTE) The  Xpert Xpress SARS-CoV-2/FLU/RSV plus assay is intended as an aid in the diagnosis of influenza from Nasopharyngeal swab specimens and should not be used as a sole basis for treatment. Nasal washings and aspirates are unacceptable for Xpert Xpress SARS-CoV-2/FLU/RSV testing.  Fact Sheet for Patients: EntrepreneurPulse.com.au  Fact Sheet for Healthcare Providers: IncredibleEmployment.be  This test is not yet approved or cleared by the [place] FDA and has been authorized for detection and/or diagnosis of SARS-CoV-2 by FDA under an Emergency Use Authorization (EUA). This EUA will remain in effect (meaning this test can be used) for the duration of  the COVID-19 declaration under Section 564(b)(1) of the Act, 21 U.S.C. section 360bbb-3(b)(1), unless the authorization is terminated or revoked.  Performed at [facility], [address] [place], [place] [address]   Urine Culture     Status: Abnormal   Collection Time: 01/18/22 12:58 PM   Specimen: Urine, Clean Catch  Result Value Ref Range Status   Specimen Description   Final    URINE, CLEAN CATCH Performed at [facility], [address] [place], [place] [address]    Special Requests   Final    NONE Performed at [facility], [place], [place] [address]    Culture >=100,000 COLONIES/mL ESCHERICHIA COLI (A)  Final   Report Status 01/21/2022 FINAL  Final   Organism ID, Bacteria ESCHERICHIA COLI (A)  Final      Susceptibility   Escherichia coli - MIC*    AMPICILLIN 8 SENSITIVE Sensitive     CEFAZOLIN <=4 SENSITIVE Sensitive     CEFEPIME <=0.12 SENSITIVE Sensitive     CEFTRIAXONE <=0.25 SENSITIVE Sensitive     CIPROFLOXACIN <=0.25 SENSITIVE Sensitive     GENTAMICIN <=1 SENSITIVE Sensitive     IMIPENEM <=0.25 SENSITIVE Sensitive     NITROFURANTOIN <=16 SENSITIVE Sensitive     TRIMETH/SULFA <=20 SENSITIVE Sensitive     AMPICILLIN/SULBACTAM 4 SENSITIVE Sensitive     PIP/TAZO <=4 SENSITIVE Sensitive     * >=100,000 COLONIES/mL ESCHERICHIA COLI  MRSA Next Gen by PCR, Nasal     Status: None   Collection Time: 01/18/22  4:59 PM   Specimen: Nasal Mucosa; Nasal Swab  Result Value Ref Range Status   MRSA by PCR Next Gen NOT DETECTED NOT DETECTED Final    Comment: (NOTE) The GeneXpert MRSA Assay (FDA approved for NASAL specimens only), is one component of a comprehensive MRSA colonization surveillance program. It is not intended to diagnose MRSA infection nor to guide or monitor treatment for MRSA infections. Test performance is not FDA approved in patients less than 52 years old. Performed at [facility], [address] [place], [place] [address]          Radiology Studies: No results found.      Scheduled Meds:  aspirin  81 mg Oral Daily   divalproex  500 mg Oral Q12H   enoxaparin (LOVENOX) injection  40 mg Subcutaneous Q24H   pantoprazole  40 mg Oral QHS   QUEtiapine  25 mg Oral QHS   Continuous Infusions:   LOS: 9 days    Time spent: 24 minutes    Sharen Hones, MD [facility]   To contact the attending provider between 7A-7P or the covering provider during after hours 7P-7A, please log into the web site [email or web address] and access using universal [place] password for that web site. If you do not have the password, please call the hospital operator.  01/27/2022,  12:03 PM

## 2022-01-27 NOTE — Progress Notes (Signed)
Physical Therapy Treatment Patient Details Name: Morgan [name] MRN: [id number] DOB: 1951-09-14 Today's Date: 01/27/2022   History of Present Illness Pt is a 71 y/o F who presented to the ED with c/o syncope, L sided weakness/heaviness, & L sided facial droop. Code stroke was called & pt received TNK at 1119; pt was admitted to the ICU for further work up & monitoring. Initial MRI was negative for acute processes. PMH: CHF, HTN, mitral valve prolapse, e. 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 She has improved vision with less physical deficits. Less ataxic movements with improved motor coordination. She was able to exit bed, stand, and ambulate ~ 8 ft with RW. Pt still requiring more assistance than she would have at home. PT continues to feel SNF at [place] is safely DC disposition. She will continue to benefit from the PT/OT she would receive at a rehab ([facility]). Acute PT will continue to follow and progress as able per current POC.    Recommendations for follow up therapy are one component of a multi-disciplinary discharge planning process, led by the attending physician.  Recommendations may be updated based on patient status, additional functional criteria and insurance authorization.  Follow Up Recommendations  Skilled nursing-short term rehab (<3 hours/day)     Assistance Recommended at Discharge Frequent or constant Supervision/Assistance  Patient can return home with the following A little help with walking and/or transfers;A little help with bathing/dressing/bathroom;Assistance with cooking/housework;Assist for transportation;Help with stairs or ramp for entrance   Equipment Recommendations  None recommended by PT       Precautions /  Restrictions Precautions Precautions: Fall Restrictions Weight Bearing Restrictions: No     Mobility  Bed Mobility Overal bed mobility: Needs Assistance Bed Mobility: Supine to Sit     Supine to sit: Min guard, HOB elevated     General bed mobility comments: HOB elevated, pt used bed rails however was able to achieve EOB short sit without physical assistance. increased time to perform. Much improved mobility than previously observed by author ~ 1 week prior. Vision also seems to be slightly improving. Less overall ataxia with much imporve motor planning/coordination    Transfers Overall transfer level: Needs assistance Equipment used: Rolling walker (2 wheels) Transfers: Sit to/from Stand Sit to Stand: Min assist           General transfer comment: Min assist    Ambulation/Gait Ambulation/Gait assistance: Min assist Gait Distance (Feet): 8 Feet Assistive device: Rolling walker (2 wheels) Gait Pattern/deviations: Step-through pattern, Trunk flexed Gait velocity: decreased     General Gait Details: pt was able to ambulate from EOB to recliner placed ~ 8 ft away. she is incontinent of urine throughout any/all transfers/mobility. gait distance by pt more so than physical limited.     Balance Overall balance assessment: Needs assistance Sitting-balance support: Feet supported, Single extremity supported Sitting balance-Leahy Scale: Fair     Standing balance support: Bilateral upper extremity supported, Reliant on assistive device for balance Standing balance-Leahy Scale: Fair     Cognition Arousal/Alertness: Awake/alert Behavior During Therapy: WFL for tasks assessed/performed Overall Cognitive Status: Within Functional Limits for tasks assessed      General Comments: Pt is A and O but  continues to c/o severe headaches. unwilling to allow lights to be turned on. Continues to have severe incontinence with all mobility and transfers.               Pertinent  Vitals/Pain Pain Assessment Pain Assessment: 0-10 Pain Score: 6  Pain Location: headache Pain Descriptors / Indicators: Headache Pain Intervention(s): Limited activity within patient's tolerance, Monitored during session, Premedicated before session, Repositioned     PT Goals (current goals can now be found in the care plan section) Acute Rehab PT Goals Patient Stated Goal: get better so I can make it home." Progress towards PT goals: Progressing toward goals    Frequency    7X/week      PT Plan Current plan remains appropriate    Co-evaluation     PT goals addressed during session: Mobility/safety with mobility;Balance;Proper use of DME;Strengthening/ROM        AM-PAC PT "6 Clicks" Mobility   Outcome Measure  Help needed turning from your back to your side while in a flat bed without using bedrails?: None Help needed moving from lying on your back to sitting on the side of a flat bed without using bedrails?: A Little Help needed moving to and from a bed to a chair (including a wheelchair)?: A Little Help needed standing up from a chair using your arms (e.g., wheelchair or bedside chair)?: A Little Help needed to walk in hospital room?: A Lot Help needed climbing 3-5 steps with a railing? : A Lot 6 Click Score: 17    End of Session Equipment Utilized During Treatment: Oxygen Activity Tolerance: Patient tolerated treatment well;Patient limited by lethargy;Other (comment) (limited by head ache and incontinence) Patient left: in chair;with call bell/phone within reach;with chair alarm set Nurse Communication: Mobility status PT Visit Diagnosis: Difficulty in walking, not elsewhere classified (R26.2);Muscle weakness (generalized) (M62.81);Hemiplegia and hemiparesis Hemiplegia - Right/Left: Left Hemiplegia - dominant/non-dominant: Dominant Hemiplegia - caused by: Unspecified     Time: 9485-4627 PT Time Calculation (min) (ACUTE ONLY): 13 min  Charges:  $Therapeutic  Activity: 8-22 mins                     Julaine Fusi PTA 01/27/22, 12:28 PM

## 2022-01-27 NOTE — TOC Progression Note (Addendum)
Transition of Care [facility] - Progression Note    Patient Details  Name: Morgan [name] MRN: [id number] Date of Birth: Feb 09, 1951  Transition of Care [facility]) CM/SW [place], LCSW Phone Number: 01/27/2022, 8:56 AM  Clinical Narrative:   Reached out to [facility] and asked to be notified if she gets insurance auth this weekend.  4:50- Per [facility], SNF insurance Josem Kaufmann is still pending. TOC will continue to follow.   Expected Discharge Plan: IP Rehab Facility Barriers to Discharge: Continued Medical Work up  Expected Discharge Plan and Services Expected Discharge Plan: [place]   Discharge Planning Services: CM Consult   Living arrangements for the past 2 months: Single Family Home                 DME Arranged: N/A DME Agency: NA                   Social Determinants of Health (SDOH) Interventions    Readmission Risk Interventions No flowsheet data found.

## 2022-01-28 MED ORDER — OXYCODONE-ACETAMINOPHEN 5-325 MG PO TABS
1.0000 | ORAL_TABLET | ORAL | Status: DC | PRN
Start: 2022-01-28 — End: 2022-01-30
  Administered 2022-01-28 – 2022-01-30 (×5): 1 via ORAL
  Filled 2022-01-28 (×5): qty 1

## 2022-01-28 NOTE — Progress Notes (Signed)
PROGRESS NOTE    Morgan [name]  YQI:[id number] DOB: [birth year] DOA: 01/18/2022 PCP: [name], MD    Brief Narrative:  Morgan [name] is a 71 year old female with a past medical history significant for CHF, hypertension, mitral valve prolapse, Parkinson's disease, anemia, and arthritis who presented to [facility] ED on 01/18/2022 due to complaints of syncope, left-sided weakness/heaviness, left-sided facial droop, and left-sided visual field cut while at outpatient clinic getting a renal US.   Pt received TNK during stroke code and was admitted to ICU.  Subsequent MRI brain was neg for acute stroke.  Pt transferred to hospitalist service on 01/20/22.   Patient was seen by neurology, patient did not have a stroke.  She has hemiplegic migraine.   Assessment & Plan:   Principal Problem:   Acute ischemic stroke [facility]) Active Problems:   Essential hypertension   Chronic respiratory failure with hypoxia (HCC)   Stage 3a chronic kidney disease (HCC)   Hemiplegic migraine      Hemiplegic migraine with persistent left-sided weakness. Stroke ruled out. She has been evaluated by neurology, MRI has ruled out a stroke. Patient slept better last night after Seroquel.  She still has migraine when she woke up.  Discussed with RN to give sumatriptan.  Also added as needed pain medicine.  Chronic kidney disease stage IIIa. AKI ruled out Mild hyponatremia Hypokalemia. Recheck a BMP tomorrow.  COPD  Chronic hypoxemic respiratory failure No bronchospasm.   Anemia of chronic disease. Continue to monitor.     DVT prophylaxis: Lovenox Code Status: full Family Communication:  Disposition Plan: Pending nursing home placement.     Status is: Inpatient Remains inpatient appropriate because: Severity of disease, still symptomatic.  Also pending nursing home placement.         I/O last 3 completed shifts: In: 34 [P.O.:960] Out: 1200 [Urine:1200] Total I/O In: 240 [P.O.:240] Out: -       Consultants:  None  Procedures: None  Antimicrobials: None  Subjective: Patient slept better last night, she still woke with migraine headache.  Nausea, no vomiting.  She has a good appetite. No fever or chills.  Objective: Vitals:   01/27/22 0738 01/27/22 2125 01/28/22 0434 01/28/22 0843  BP: 129/78 140/76 (!) 146/75 128/67  Pulse: 70 73 70 69  Resp: 16 18 16 17   Temp: 98.1 F (36.7 C) 98.6 F (37 C) 98.3 F (36.8 C) 97.7 F (36.5 C)  TempSrc: Oral  Oral Oral  SpO2: 99% 98% 99% 99%  Weight:      Height:        Intake/Output Summary (Last 24 hours) at 01/28/2022 1040 Last data filed at 01/28/2022 0900 Gross per 24 hour  Intake 960 ml  Output 800 ml  Net 160 ml   Filed Weights   01/18/22 1054 01/18/22 1100 01/18/22 1700  Weight: 107.5 kg 107.5 kg 107.6 kg    Examination:  General exam: Appears calm and comfortable  Respiratory system: Clear to auscultation. Respiratory effort normal. Cardiovascular system: S1 & S2 heard, RRR. No JVD, murmurs, rubs, gallops or clicks. No pedal edema. Gastrointestinal system: Abdomen is nondistended, soft and nontender. No organomegaly or masses felt. Normal bowel sounds heard. Central nervous system: Alert and oriented. No focal neurological deficits. Extremities: Symmetric 5 x 5 power. Skin: No rashes, lesions or ulcers Psychiatry: Judgement and insight appear normal. Mood & affect appropriate.     Data Reviewed: I have personally reviewed following labs and imaging studies  CBC: Recent Labs  Lab  01/22/22 0650 01/23/22 0519 01/24/22 0440 01/26/22 0356  WBC 8.8 9.9 9.8 10.0  HGB 10.0* 10.2* 10.1* 9.9*  HCT 31.7* 33.1* 32.4* 31.9*  MCV 83.6 85.1 84.2 84.8  PLT 257 250 254 166   Basic Metabolic Panel: Recent Labs  Lab 01/22/22 0650 01/23/22 0519 01/24/22 0440 01/26/22 0356  NA 138 139 137 139  K 3.5 3.4* 3.2* 3.5  CL 100 100 99 99  CO2 31 32 32 33*  GLUCOSE 101* 105* 117* 104*  BUN 20 20 17 20    CREATININE 1.25* 1.17* 1.18* 1.03*  CALCIUM 8.7* 8.9 8.6* 9.1  MG 1.9 1.9 2.0 2.0   GFR: Estimated Creatinine Clearance: 72.9 mL/min (A) (by C-G formula based on SCr of 1.03 mg/dL (H)). Liver Function Tests: No results for input(s): AST, ALT, ALKPHOS, BILITOT, PROT, ALBUMIN in the last 168 hours. No results for input(s): LIPASE, AMYLASE in the last 168 hours. No results for input(s): AMMONIA in the last 168 hours. Coagulation Profile: No results for input(s): INR, PROTIME in the last 168 hours. Cardiac Enzymes: No results for input(s): CKTOTAL, CKMB, CKMBINDEX, TROPONINI in the last 168 hours. BNP (last 3 results) Recent Labs    08/29/21 0931  PROBNP 180.0*   HbA1C: No results for input(s): HGBA1C in the last 72 hours. CBG: No results for input(s): GLUCAP in the last 168 hours. Lipid Profile: No results for input(s): CHOL, HDL, LDLCALC, TRIG, CHOLHDL, LDLDIRECT in the last 72 hours. Thyroid Function Tests: No results for input(s): TSH, T4TOTAL, FREET4, T3FREE, THYROIDAB in the last 72 hours. Anemia Panel: No results for input(s): VITAMINB12, FOLATE, FERRITIN, TIBC, IRON, RETICCTPCT in the last 72 hours. Sepsis Labs: No results for input(s): PROCALCITON, LATICACIDVEN in the last 168 hours.  Recent Results (from the past 240 hour(s))  Resp Panel by RT-PCR (Flu A&B, Covid) Nasopharyngeal Swab     Status: None   Collection Time: 01/18/22 12:50 PM   Specimen: Nasopharyngeal Swab; Nasopharyngeal(NP) swabs in vial transport medium  Result Value Ref Range Status   SARS Coronavirus 2 by RT PCR NEGATIVE NEGATIVE Final    Comment: (NOTE) SARS-CoV-2 target nucleic acids are NOT DETECTED.  The SARS-CoV-2 RNA is generally detectable in upper respiratory specimens during the acute phase of infection. The lowest concentration of SARS-CoV-2 viral copies this assay can detect is 138 copies/mL. A negative result does not preclude SARS-Cov-2 infection and should not be used as the sole  basis for treatment or other patient management decisions. A negative result may occur with  improper specimen collection/handling, submission of specimen other than nasopharyngeal swab, presence of viral mutation(s) within the areas targeted by this assay, and inadequate number of viral copies(<138 copies/mL). A negative result must be combined with clinical observations, patient history, and epidemiological information. The expected result is Negative.  Fact Sheet for Patients:  EntrepreneurPulse.com.au  Fact Sheet for Healthcare Providers:  IncredibleEmployment.be  This test is no t yet approved or cleared by the [place] FDA and  has been authorized for detection and/or diagnosis of SARS-CoV-2 by FDA under an Emergency Use Authorization (EUA). This EUA will remain  in effect (meaning this test can be used) for the duration of the COVID-19 declaration under Section 564(b)(1) of the Act, 21 U.S.C.section 360bbb-3(b)(1), unless the authorization is terminated  or revoked sooner.       Influenza A by PCR NEGATIVE NEGATIVE Final   Influenza B by PCR NEGATIVE NEGATIVE Final    Comment: (NOTE) The Xpert Xpress SARS-CoV-2/FLU/RSV plus assay is  intended as an aid in the diagnosis of influenza from Nasopharyngeal swab specimens and should not be used as a sole basis for treatment. Nasal washings and aspirates are unacceptable for Xpert Xpress SARS-CoV-2/FLU/RSV testing.  Fact Sheet for Patients: EntrepreneurPulse.com.au  Fact Sheet for Healthcare Providers: IncredibleEmployment.be  This test is not yet approved or cleared by the [place] FDA and has been authorized for detection and/or diagnosis of SARS-CoV-2 by FDA under an Emergency Use Authorization (EUA). This EUA will remain in effect (meaning this test can be used) for the duration of the COVID-19 declaration under Section 564(b)(1) of the Act,  21 U.S.C. section 360bbb-3(b)(1), unless the authorization is terminated or revoked.  Performed at [facility], [address] [place], [place] [address]   Urine Culture     Status: Abnormal   Collection Time: 01/18/22 12:58 PM   Specimen: Urine, Clean Catch  Result Value Ref Range Status   Specimen Description   Final    URINE, CLEAN CATCH Performed at [facility], [address] [place], [place] [address]    Special Requests   Final    NONE Performed at [facility], [place], [place] [address]    Culture >=100,000 COLONIES/mL ESCHERICHIA COLI (A)  Final   Report Status 01/21/2022 FINAL  Final   Organism ID, Bacteria ESCHERICHIA COLI (A)  Final      Susceptibility   Escherichia coli - MIC*    AMPICILLIN 8 SENSITIVE Sensitive     CEFAZOLIN <=4 SENSITIVE Sensitive     CEFEPIME <=0.12 SENSITIVE Sensitive     CEFTRIAXONE <=0.25 SENSITIVE Sensitive     CIPROFLOXACIN <=0.25 SENSITIVE Sensitive     GENTAMICIN <=1 SENSITIVE Sensitive     IMIPENEM <=0.25 SENSITIVE Sensitive     NITROFURANTOIN <=16 SENSITIVE Sensitive     TRIMETH/SULFA <=20 SENSITIVE Sensitive     AMPICILLIN/SULBACTAM 4 SENSITIVE Sensitive     PIP/TAZO <=4 SENSITIVE Sensitive     * >=100,000 COLONIES/mL ESCHERICHIA COLI  MRSA Next Gen by PCR, Nasal     Status: None   Collection Time: 01/18/22  4:59 PM   Specimen: Nasal Mucosa; Nasal Swab  Result Value Ref Range Status   MRSA by PCR Next Gen NOT DETECTED NOT DETECTED Final    Comment: (NOTE) The GeneXpert MRSA Assay (FDA approved for NASAL specimens only), is one component of a comprehensive MRSA colonization surveillance program. It is not intended to diagnose MRSA infection nor to guide or monitor treatment for MRSA infections. Test performance is not FDA approved in patients less than 48 years old. Performed at [facility], [address] [place], [place] [address]          Radiology  Studies: No results found.      Scheduled Meds:  aspirin  81 mg Oral Daily   divalproex  500 mg Oral Q12H   enoxaparin (LOVENOX) injection  40 mg Subcutaneous Q24H   pantoprazole  40 mg Oral QHS   QUEtiapine  25 mg Oral QHS   Continuous Infusions:   LOS: 10 days    Time spent: 22 minutes    Sharen Hones, MD [facility]   To contact the attending provider between 7A-7P or the covering provider during after hours 7P-7A, please log into the web site [email or web address] and access using universal [place] password for that web site. If you do not have the password, please call the hospital operator.  01/28/2022, 10:40 AM

## 2022-01-28 NOTE — Progress Notes (Signed)
PT Cancellation Note  Patient Details Name: Morgan [name] MRN: [id number] DOB: Jan 27, 1951   Cancelled Treatment:    Reason Eval/Treat Not Completed: Pain limiting ability to participate  Attempted x 2 today.  On first attempt pt asleep.  On second she stated she has increased headache today.  Pt typically works hard and has yet to refuse offer of therapy.  She declined today.  Encouragement given.  Did not voice any needs.   Chesley Noon 01/28/2022, 11:17 AM

## 2022-01-29 LAB — BASIC METABOLIC PANEL
Anion gap: 8 (ref 5–15)
BUN: 28 mg/dL — ABNORMAL HIGH (ref 8–23)
CO2: 32 mmol/L (ref 22–32)
Calcium: 8.7 mg/dL — ABNORMAL LOW (ref 8.9–10.3)
Chloride: 98 mmol/L (ref 98–111)
Creatinine, Ser: 1.13 mg/dL — ABNORMAL HIGH (ref 0.44–1.00)
GFR, Estimated: 53 mL/min — ABNORMAL LOW (ref >60)
Glucose, Bld: 96 mg/dL (ref 70–99)
Potassium: 3.4 mmol/L — ABNORMAL LOW (ref 3.5–5.1)
Sodium: 138 mmol/L (ref 135–145)

## 2022-01-29 LAB — MAGNESIUM: Magnesium: 2 mg/dL (ref 1.7–2.4)

## 2022-01-29 MED ORDER — POTASSIUM CHLORIDE 10 MEQ/100ML IV SOLN
10.0000 meq | INTRAVENOUS | Status: AC
  Administered 2022-01-29 (×2): 10 meq via INTRAVENOUS
  Filled 2022-01-29 (×2): qty 100

## 2022-01-29 NOTE — Progress Notes (Signed)
Physical Therapy Treatment Patient Details Name: Morgan [name] MRN: [id number] DOB: 08/02/1951 Today's Date: 01/29/2022   History of Present Illness Pt is a 71 y/o F who presented to the ED with c/o syncope, L sided weakness/heaviness, & L sided facial droop. Code stroke was called & pt received TNK at 1119; pt was admitted to the ICU for further work up & monitoring. Initial MRI was negative for acute processes. PMH: CHF, HTN, mitral valve prolapse, e. coli UTIs, PD, anemia, arthritis, COPD on 2L, CKD, BPPV    PT Comments    Pt ready for session up in chair.  Headache continues but is generally better than yesterday.  Participated in exercises as described below.  Stood and marches in place x 15.  She is able to walk 4' to and from [facility] in attempts to void - unable.  She does ask when incontinence will improve.  Pt encouraged to talk to MD and to start using [facility] with staff as she today was the first time she said she has been to commode.  Voiced understanding.  Will cont with ind HEP.  Fatigued with effort today but remains motivated to work in sessions to return home after SNF.   Recommendations for follow up therapy are one component of a multi-disciplinary discharge planning process, led by the attending physician.  Recommendations may be updated based on patient status, additional functional criteria and insurance authorization.  Follow Up Recommendations  Skilled nursing-short term rehab (<3 hours/day)     Assistance Recommended at Discharge Frequent or constant Supervision/Assistance  Patient can return home with the following A little help with walking and/or transfers;A little help with bathing/dressing/bathroom;Assistance with cooking/housework;Assist for transportation;Help with stairs or ramp for entrance   Equipment Recommendations  None recommended by PT    Recommendations for Other Services       Precautions / Restrictions Precautions Precautions: Fall Restrictions Weight  Bearing Restrictions: No     Mobility  Bed Mobility               General bed mobility comments: in recliner before and after    Transfers Overall transfer level: Needs assistance Equipment used: Rolling walker (2 wheels) Transfers: Sit to/from Stand Sit to Stand: Min assist                Ambulation/Gait Ambulation/Gait assistance: Min Web designer (Feet): 4 Feet Assistive device: Rolling walker (2 wheels) Gait Pattern/deviations: Step-through pattern, Trunk flexed Gait velocity: decreased     General Gait Details: abe to/from [facility]   Stairs             Wheelchair Mobility    Modified Rankin (Stroke Patients Only)       Balance Overall balance assessment: Needs assistance Sitting-balance support: Feet supported, Single extremity supported Sitting balance-Leahy Scale: Fair     Standing balance support: Bilateral upper extremity supported, Reliant on assistive device for balance, During functional activity, Single extremity supported Standing balance-Leahy Scale: Fair Standing balance comment: able to attend to self care/pericare in standing                            Cognition Arousal/Alertness: Awake/alert Behavior During Therapy: WFL for tasks assessed/performed Overall Cognitive Status: Within Functional Limits for tasks assessed  Exercises Other Exercises Other Exercises: seated AROM x 10 and marching in place x 15    General Comments        Pertinent Vitals/Pain Pain Assessment Pain Assessment: Faces Faces Pain Scale: Hurts even more Pain Location: headache Pain Descriptors / Indicators: Headache Pain Intervention(s): Limited activity within patient's tolerance, Monitored during session, Repositioned    Home Living                          Prior Function            PT Goals (current goals can now be found in the care plan section)  Progress towards PT goals: Progressing toward goals    Frequency    7X/week      PT Plan Current plan remains appropriate    Co-evaluation              AM-PAC PT "6 Clicks" Mobility   Outcome Measure  Help needed turning from your back to your side while in a flat bed without using bedrails?: None Help needed moving from lying on your back to sitting on the side of a flat bed without using bedrails?: A Little Help needed moving to and from a bed to a chair (including a wheelchair)?: A Little Help needed standing up from a chair using your arms (e.g., wheelchair or bedside chair)?: A Little Help needed to walk in hospital room?: A Lot Help needed climbing 3-5 steps with a railing? : A Lot 6 Click Score: 17    End of Session Equipment Utilized During Treatment: Oxygen Activity Tolerance: Patient tolerated treatment well;Other (comment) Patient left: in chair;with call bell/phone within reach;with chair alarm set Nurse Communication: Mobility status PT Visit Diagnosis: Difficulty in walking, not elsewhere classified (R26.2);Muscle weakness (generalized) (M62.81);Hemiplegia and hemiparesis Hemiplegia - Right/Left: Left Hemiplegia - dominant/non-dominant: Dominant Hemiplegia - caused by: Unspecified     Time: 0157-0216 PT Time Calculation (min) (ACUTE ONLY): 19 min  Charges:  $Gait Training: 8-22 mins                    Chesley Noon, PTA 01/29/22, 2:28 PM

## 2022-01-29 NOTE — Progress Notes (Signed)
PROGRESS NOTE    Morgan [name]  EKC:[id number] DOB: Jul 20, 1951 DOA: 01/18/2022 PCP: [name], MD    Brief Narrative:   Morgan [name] is a 71 year old female with a past medical history significant for CHF, hypertension, mitral valve prolapse, Parkinson's disease, anemia, and arthritis who presented to [facility] ED on 01/18/2022 due to complaints of syncope, left-sided weakness/heaviness, left-sided facial droop, and left-sided visual field cut while at outpatient clinic getting a renal US.   Pt received TNK during stroke code and was admitted to ICU.  Subsequent MRI brain was neg for acute stroke.  Pt transferred to hospitalist service on 01/20/22.   Patient was seen by neurology, patient did not have a stroke.  She has hemiplegic migraine.  Assessment & Plan:   Principal Problem:   Acute ischemic stroke [facility]) Active Problems:   Essential hypertension   Chronic respiratory failure with hypoxia (HCC)   Stage 3a chronic kidney disease (HCC)   Hemiplegic migraine    Hemiplegic migraine with persistent left-sided weakness. Stroke ruled out. She has been evaluated by neurology, MRI has ruled out a stroke. Continue Depakote and as needed sumatriptan.  Also added pain medicine.  She has migraine headache every morning.  Chronic kidney disease stage IIIa. AKI ruled out Mild hyponatremia Hypokalemia. Condition stable/improved.  COPD  Chronic hypoxemic respiratory failure No bronchospasm.   Anemia of chronic disease. Continue to monitor.     DVT prophylaxis: Lovenox Code Status: full Family Communication: Patient does not give permission to call anybody.  She only has a daughter which she has not had contact for many years. Disposition Plan: Pending nursing home placement.     Status is: Inpatient Remains inpatient appropriate because: Unsafe discharge, pending nursing home placement.       I/O last 3 completed shifts: In: 60 [P.O.:720] Out: 1300 [Urine:1300] Total  I/O In: 240 [P.O.:240] Out: -     Subjective: Patient still has significant migraine headache this morning, nausea, no vomiting. She slept well last night. Denies any short of breath or cough.  Objective: Vitals:   01/28/22 1630 01/28/22 2149 01/29/22 0532 01/29/22 0915  BP: 112/63 123/74 131/74 137/70  Pulse: 71 77 74 72  Resp: 18 18 16 18   Temp: 98.3 F (36.8 C) 98.1 F (36.7 C) 97.9 F (36.6 C) 97.9 F (36.6 C)  TempSrc: Oral   Oral  SpO2: 99% 100% 99% 99%  Weight:      Height:        Intake/Output Summary (Last 24 hours) at 01/29/2022 1224 Last data filed at 01/29/2022 1011 Gross per 24 hour  Intake 720 ml  Output 500 ml  Net 220 ml   Filed Weights   01/18/22 1054 01/18/22 1100 01/18/22 1700  Weight: 107.5 kg 107.5 kg 107.6 kg    Examination:  General exam: Appears calm and comfortable  Respiratory system: Clear to auscultation. Respiratory effort normal. Cardiovascular system: S1 & S2 heard, RRR. No JVD, murmurs, rubs, gallops or clicks. No pedal edema. Gastrointestinal system: Abdomen is nondistended, soft and nontender. No organomegaly or masses felt. Normal bowel sounds heard. Central nervous system: Alert and oriented x3. No focal neurological deficits. Extremities: Symmetric 5 x 5 power. Skin: No rashes, lesions or ulcers Psychiatry: Judgement and insight appear normal. Mood & affect appropriate.     Data Reviewed: I have personally reviewed following labs and imaging studies  CBC: Recent Labs  Lab 01/23/22 0519 01/24/22 0440 01/26/22 0356  WBC 9.9 9.8 10.0  HGB 10.2*  10.1* 9.9*  HCT 33.1* 32.4* 31.9*  MCV 85.1 84.2 84.8  PLT 250 254 621   Basic Metabolic Panel: Recent Labs  Lab 01/23/22 0519 01/24/22 0440 01/26/22 0356 01/29/22 0444  NA 139 137 139 138  K 3.4* 3.2* 3.5 3.4*  CL 100 99 99 98  CO2 32 32 33* 32  GLUCOSE 105* 117* 104* 96  BUN 20 17 20  28*  CREATININE 1.17* 1.18* 1.03* 1.13*  CALCIUM 8.9 8.6* 9.1 8.7*  MG 1.9 2.0  2.0 2.0   GFR: Estimated Creatinine Clearance: 66.4 mL/min (A) (by C-G formula based on SCr of 1.13 mg/dL (H)). Liver Function Tests: No results for input(s): AST, ALT, ALKPHOS, BILITOT, PROT, ALBUMIN in the last 168 hours. No results for input(s): LIPASE, AMYLASE in the last 168 hours. No results for input(s): AMMONIA in the last 168 hours. Coagulation Profile: No results for input(s): INR, PROTIME in the last 168 hours. Cardiac Enzymes: No results for input(s): CKTOTAL, CKMB, CKMBINDEX, TROPONINI in the last 168 hours. BNP (last 3 results) Recent Labs    08/29/21 0931  PROBNP 180.0*   HbA1C: No results for input(s): HGBA1C in the last 72 hours. CBG: No results for input(s): GLUCAP in the last 168 hours. Lipid Profile: No results for input(s): CHOL, HDL, LDLCALC, TRIG, CHOLHDL, LDLDIRECT in the last 72 hours. Thyroid Function Tests: No results for input(s): TSH, T4TOTAL, FREET4, T3FREE, THYROIDAB in the last 72 hours. Anemia Panel: No results for input(s): VITAMINB12, FOLATE, FERRITIN, TIBC, IRON, RETICCTPCT in the last 72 hours. Sepsis Labs: No results for input(s): PROCALCITON, LATICACIDVEN in the last 168 hours.  No results found for this or any previous visit (from the past 240 hour(s)).       Radiology Studies: No results found.      Scheduled Meds:  aspirin  81 mg Oral Daily   divalproex  500 mg Oral Q12H   enoxaparin (LOVENOX) injection  40 mg Subcutaneous Q24H   pantoprazole  40 mg Oral QHS   QUEtiapine  25 mg Oral QHS   Continuous Infusions:   LOS: 11 days    Time spent: 25 minutes    Sharen Hones, MD [facility]   To contact the attending provider between 7A-7P or the covering provider during after hours 7P-7A, please log into the web site [email or web address] and access using universal [place] password for that web site. If you do not have the password, please call the hospital operator.  01/29/2022, 12:24 PM

## 2022-01-29 NOTE — Care Management Important Message (Signed)
Important Message  Patient Details  Name: Morgan [name] MRN: [id number] Date of Birth: [birth year]   Medicare Important Message Given:  Yes     Juliann Pulse A Adriannah Steinkamp 01/29/2022, 10:26 AM

## 2022-01-29 NOTE — TOC Progression Note (Signed)
Transition of Care [facility]) - Progression Note    Patient Details  Name: Morgan [name] MRN: [id number] Date of Birth: Apr 26, 1951  Transition of Care [facility]) CM/SW [place], RN Phone Number: 01/29/2022, 2:12 PM  Clinical Narrative:   Continuing to wait for SNF authorization from [place], [facility] supervisor aware.  TOC will monitor for auth.    Expected Discharge Plan: IP Rehab Facility Barriers to Discharge: Continued Medical Work up  Expected Discharge Plan and Services Expected Discharge Plan: [place]   Discharge Planning Services: CM Consult   Living arrangements for the past 2 months: Single Family Home                 DME Arranged: N/A DME Agency: NA                   Social Determinants of Health (SDOH) Interventions    Readmission Risk Interventions No flowsheet data found.

## 2022-01-29 NOTE — Progress Notes (Signed)
Occupational Therapy Treatment Patient Details Name: Morgan [name] MRN: [id number] DOB: January 23, 1951 Today's Date: 01/29/2022   History of present illness Pt is a 71 y/o F who presented to the ED with c/o syncope, L sided weakness/heaviness, & L sided facial droop. Code stroke was called & pt received TNK at 1119; pt was admitted to the ICU for further work up & monitoring. Initial MRI was negative for acute processes. PMH: CHF, HTN, mitral valve prolapse, e. coli UTIs, PD, anemia, arthritis, COPD on 2L, CKD, BPPV   OT comments  Upon entering the room, pt supine in bed with c/o headache. OT noted TV to be on in dark room and TV turned off to create quiet soothing environment. Pt is agreeable to OT intervention. Wash cloth provided on L side with pt obtaining and washing face and hands without issue utilizing L UE. Supine >sit with supervision and min cuing for technique and hand placement. Pt declined use of RW and stands with 1 person HHA and takes several steps to the L with min A to recliner chair. Pt continues to report headache and RN reports medication given prior to therapist arrival. Chair alarm activated and all needs within reach. Pt continues to benefit from OT intervention.    Recommendations for follow up therapy are one component of a multi-disciplinary discharge planning process, led by the attending physician.  Recommendations may be updated based on patient status, additional functional criteria and insurance authorization.    Follow Up Recommendations  Skilled nursing-short term rehab (<3 hours/day)       Patient can return home with the following  A lot of help with walking and/or transfers;A lot of help with bathing/dressing/bathroom;Assistance with cooking/housework;Assist for transportation;Help with stairs or ramp for entrance;Direct supervision/assist for medications management   Equipment Recommendations  BSC/3in1;Other (comment) (RW)       Precautions / Restrictions  Precautions Precautions: Fall Restrictions Weight Bearing Restrictions: No       Mobility Bed Mobility Overal bed mobility: Needs Assistance Bed Mobility: Supine to Sit     Supine to sit: Supervision     General bed mobility comments: cuing for technique and HOB elevated    Transfers Overall transfer level: Needs assistance Equipment used: 1 person hand held assist Transfers: Sit to/from Stand Sit to Stand: Min assist     Step pivot transfers: Min assist           Balance Overall balance assessment: Needs assistance Sitting-balance support: Feet supported, Single extremity supported Sitting balance-Leahy Scale: Fair Sitting balance - Comments: static sitting on BSC without LOB   Standing balance support: Bilateral upper extremity supported, Reliant on assistive device for balance Standing balance-Leahy Scale: Fair Standing balance comment: 1UE support & min assist during transfers                           ADL either performed or assessed with clinical judgement   ADL Overall ADL's : Needs assistance/impaired     Grooming: Wash/dry hands;Wash/dry face;Sitting;Set up;Supervision/safety Grooming Details (indicate cue type and reason): with use of L UE                 Toilet Transfer: Minimal assistance Toilet Transfer Details (indicate cue type and reason): simulated         Functional mobility during ADLs: Minimal assistance General ADL Comments: min A for step pivot transfer to the L without use of AD      Cognition  Arousal/Alertness: Awake/alert Behavior During Therapy: WFL for tasks assessed/performed Overall Cognitive Status: Within Functional Limits for tasks assessed                                 General Comments: Pt is A and O but continues to c/o severe headaches. OT turning off loud TV in room to create calm and quiet environment.                   Pertinent Vitals/ Pain       Pain Assessment Pain  Assessment: 0-10 Pain Score: 6  Pain Location: headache Pain Descriptors / Indicators: Headache Pain Intervention(s): Limited activity within patient's tolerance, Monitored during session, Premedicated before session, Repositioned         Frequency  Min 2X/week        Progress Toward Goals  OT Goals(current goals can now be found in the care plan section)  Progress towards OT goals: Progressing toward goals  Acute Rehab OT Goals Patient Stated Goal: to go home OT Goal Formulation: With patient Time For Goal Achievement: 02/02/22 Potential to Achieve Goals: Good  Plan Discharge plan remains appropriate;Frequency remains appropriate       AM-PAC OT "6 Clicks" Daily Activity     Outcome Measure   Help from another person eating meals?: None Help from another person taking care of personal grooming?: A Little Help from another person toileting, which includes using toliet, bedpan, or urinal?: A Little Help from another person bathing (including washing, rinsing, drying)?: A Little Help from another person to put on and taking off regular upper body clothing?: A Little Help from another person to put on and taking off regular lower body clothing?: A Little 6 Click Score: 19    End of Session    OT Visit Diagnosis: Other abnormalities of gait and mobility (R26.89);Hemiplegia and hemiparesis Hemiplegia - Right/Left: Left Hemiplegia - caused by: Unspecified   Activity Tolerance Patient tolerated treatment well   Patient Left in bed;with call bell/phone within reach   Nurse Communication Mobility status (nausea)        Time: 8828-0034 OT Time Calculation (min): 23 min  Charges: OT General Charges $OT Visit: 1 Visit OT Treatments $Self Care/Home Management : 8-22 mins $Therapeutic Activity: 8-22 mins  Darleen Crocker, MS, OTR/L , CBIS ascom [phone]  01/29/22, 1:15 PM

## 2022-01-30 DIAGNOSIS — J9611 Chronic respiratory failure with hypoxia: Secondary | ICD-10-CM | POA: Diagnosis not present

## 2022-01-30 DIAGNOSIS — R69 Illness, unspecified: Secondary | ICD-10-CM | POA: Diagnosis not present

## 2022-01-30 DIAGNOSIS — Z7401 Bed confinement status: Secondary | ICD-10-CM | POA: Diagnosis not present

## 2022-01-30 DIAGNOSIS — E785 Hyperlipidemia, unspecified: Secondary | ICD-10-CM | POA: Diagnosis not present

## 2022-01-30 DIAGNOSIS — K5900 Constipation, unspecified: Secondary | ICD-10-CM | POA: Diagnosis not present

## 2022-01-30 DIAGNOSIS — R5381 Other malaise: Secondary | ICD-10-CM | POA: Diagnosis not present

## 2022-01-30 DIAGNOSIS — R1311 Dysphagia, oral phase: Secondary | ICD-10-CM | POA: Diagnosis not present

## 2022-01-30 DIAGNOSIS — I10 Essential (primary) hypertension: Secondary | ICD-10-CM | POA: Diagnosis not present

## 2022-01-30 DIAGNOSIS — N183 Chronic kidney disease, stage 3 unspecified: Secondary | ICD-10-CM | POA: Diagnosis not present

## 2022-01-30 DIAGNOSIS — J449 Chronic obstructive pulmonary disease, unspecified: Secondary | ICD-10-CM | POA: Diagnosis not present

## 2022-01-30 DIAGNOSIS — N1831 Chronic kidney disease, stage 3a: Secondary | ICD-10-CM | POA: Diagnosis not present

## 2022-01-30 DIAGNOSIS — G43909 Migraine, unspecified, not intractable, without status migrainosus: Secondary | ICD-10-CM | POA: Diagnosis not present

## 2022-01-30 DIAGNOSIS — M6259 Muscle wasting and atrophy, not elsewhere classified, multiple sites: Secondary | ICD-10-CM | POA: Diagnosis not present

## 2022-01-30 DIAGNOSIS — I509 Heart failure, unspecified: Secondary | ICD-10-CM | POA: Diagnosis not present

## 2022-01-30 DIAGNOSIS — G20 Parkinson's disease: Secondary | ICD-10-CM | POA: Diagnosis not present

## 2022-01-30 DIAGNOSIS — G4701 Insomnia due to medical condition: Secondary | ICD-10-CM | POA: Diagnosis not present

## 2022-01-30 DIAGNOSIS — E559 Vitamin D deficiency, unspecified: Secondary | ICD-10-CM | POA: Diagnosis not present

## 2022-01-30 DIAGNOSIS — D638 Anemia in other chronic diseases classified elsewhere: Secondary | ICD-10-CM | POA: Diagnosis not present

## 2022-01-30 DIAGNOSIS — Z743 Need for continuous supervision: Secondary | ICD-10-CM | POA: Diagnosis not present

## 2022-01-30 DIAGNOSIS — G8192 Hemiplegia, unspecified affecting left dominant side: Secondary | ICD-10-CM | POA: Diagnosis not present

## 2022-01-30 DIAGNOSIS — I110 Hypertensive heart disease with heart failure: Secondary | ICD-10-CM | POA: Diagnosis not present

## 2022-01-30 DIAGNOSIS — R110 Nausea: Secondary | ICD-10-CM | POA: Diagnosis not present

## 2022-01-30 DIAGNOSIS — I5032 Chronic diastolic (congestive) heart failure: Secondary | ICD-10-CM | POA: Diagnosis not present

## 2022-01-30 DIAGNOSIS — G43409 Hemiplegic migraine, not intractable, without status migrainosus: Secondary | ICD-10-CM | POA: Diagnosis not present

## 2022-01-30 DIAGNOSIS — N182 Chronic kidney disease, stage 2 (mild): Secondary | ICD-10-CM | POA: Diagnosis not present

## 2022-01-30 DIAGNOSIS — Z736 Limitation of activities due to disability: Secondary | ICD-10-CM | POA: Diagnosis not present

## 2022-01-30 DIAGNOSIS — G43419 Hemiplegic migraine, intractable, without status migrainosus: Secondary | ICD-10-CM | POA: Diagnosis not present

## 2022-01-30 DIAGNOSIS — K219 Gastro-esophageal reflux disease without esophagitis: Secondary | ICD-10-CM | POA: Diagnosis not present

## 2022-01-30 DIAGNOSIS — I739 Peripheral vascular disease, unspecified: Secondary | ICD-10-CM | POA: Diagnosis not present

## 2022-01-30 DIAGNOSIS — R531 Weakness: Secondary | ICD-10-CM | POA: Diagnosis not present

## 2022-01-30 LAB — RESP PANEL BY RT-PCR (FLU A&B, COVID) ARPGX2
Influenza A by PCR: NEGATIVE
Influenza B by PCR: NEGATIVE
SARS Coronavirus 2 by RT PCR: NEGATIVE

## 2022-01-30 MED ORDER — ACETAMINOPHEN 325 MG PO TABS: 650.0000 mg | ORAL_TABLET | ORAL | Status: AC | PRN

## 2022-01-30 MED ORDER — FLUTICASONE PROPIONATE 50 MCG/ACT NA SUSP: 1.0000 | Freq: Every day | NASAL | 2 refills | Status: DC

## 2022-01-30 MED ORDER — FLUTICASONE PROPIONATE 50 MCG/ACT NA SUSP
1.0000 | Freq: Every day | NASAL | Status: DC
  Administered 2022-01-30: 11:00:00 1 via NASAL
  Filled 2022-01-30: qty 16

## 2022-01-30 MED ORDER — QUETIAPINE FUMARATE 25 MG PO TABS: 25.0000 mg | ORAL_TABLET | Freq: Every day | ORAL | Status: DC

## 2022-01-30 MED ORDER — DIVALPROEX SODIUM 500 MG PO DR TAB: 500.0000 mg | DELAYED_RELEASE_TABLET | Freq: Two times a day (BID) | ORAL | Status: DC

## 2022-01-30 MED ORDER — SUMATRIPTAN SUCCINATE 50 MG PO TABS: 50.0000 mg | ORAL_TABLET | Freq: Two times a day (BID) | ORAL | 0 refills | Status: DC | PRN

## 2022-01-30 NOTE — Progress Notes (Signed)
PROGRESS NOTE    Morgan [name]  IEP:[id number] DOB: [birth year] DOA: 01/18/2022 PCP: [name], MD   Chief complaint.  Headache. Brief Narrative:   Morgan [name] is a 71 year old female with a past medical history significant for CHF, hypertension, mitral valve prolapse, Parkinson's disease, anemia, and arthritis who presented to [facility] ED on 01/18/2022 due to complaints of syncope, left-sided weakness/heaviness, left-sided facial droop, and left-sided visual field cut while at outpatient clinic getting a renal US.   Pt received TNK during stroke code and was admitted to ICU.  Subsequent MRI brain was neg for acute stroke.  Pt transferred to hospitalist service on 01/20/22.   Patient was seen by neurology, patient did not have a stroke.  She has hemiplegic migraine.  Assessment & Plan:   Principal Problem:   Acute ischemic stroke [facility]) Active Problems:   Essential hypertension   Chronic respiratory failure with hypoxia (HCC)   Stage 3a chronic kidney disease (HCC)   Hemiplegic migraine  Hemiplegic migraine with persistent left-sided weakness. Stroke ruled out. Parkinson disease. She has been evaluated by neurology, MRI has ruled out a stroke. Continue Depakote and as needed sumatriptan.   Patient still has a migraine headache almost every morning.  Advised nurse to give sumatriptan in the morning.  Also added Flonase as the patient seem to have nasal congestion. Her sleep is better after starting the Seroquel. Avoid Compazine, Phenergan or Reglan in the setting of Parkinson disease.   Chronic kidney disease stage IIIa. AKI ruled out Mild hyponatremia Hypokalemia. Condition stable/improved.  COPD  Chronic hypoxemic respiratory failure No bronchospasm.   Anemia of chronic disease. Continue to monitor.     DVT prophylaxis: Lovenox Code Status: full Family Communication: Patient does not give permission to call anybody.  She only has a daughter which she has not had contact  for many years. Disposition Plan: Pending nursing home placement.     Status is: Inpatient Remains inpatient appropriate because: Unsafe discharge, pending nursing home placement.     I/O last 3 completed shifts: In: 720 [P.O.:720] Out: 1000 [Urine:1000] No intake/output data recorded.     Consultants:  Neurology.  Procedures: None  Antimicrobials: None Subjective: Patient still complaining of migraine headache early in the morning.  She is sleeping well at night. She still has some nausea associated with migraine, no vomiting, tolerating diet. Objective: Vitals:   01/29/22 0915 01/29/22 2031 01/30/22 0533 01/30/22 0748  BP: 137/70 122/73 127/74 136/76  Pulse: 72 72 70 73  Resp: 18 18 18 16   Temp: 97.9 F (36.6 C) 98.3 F (36.8 C) 98 F (36.7 C) 97.8 F (36.6 C)  TempSrc: Oral Oral  Oral  SpO2: 99% 98% 98% 99%  Weight:      Height:        Intake/Output Summary (Last 24 hours) at 01/30/2022 1014 Last data filed at 01/29/2022 1839 Gross per 24 hour  Intake 480 ml  Output 500 ml  Net -20 ml   Filed Weights   01/18/22 1054 01/18/22 1100 01/18/22 1700  Weight: 107.5 kg 107.5 kg 107.6 kg    Examination:  General exam: Appears calm and comfortable  Respiratory system: Clear to auscultation. Respiratory effort normal. Cardiovascular system: S1 & S2 heard, RRR. No JVD, murmurs, rubs, gallops or clicks. No pedal edema. Gastrointestinal system: Abdomen is nondistended, soft and nontender. No organomegaly or masses felt. Normal bowel sounds heard. Central nervous system: Alert and oriented. No focal neurological deficits. Extremities: Symmetric 5 x 5 power.  Skin: No rashes, lesions or ulcers Psychiatry: Judgement and insight appear normal. Mood & affect appropriate.     Data Reviewed: I have personally reviewed following labs and imaging studies  CBC: Recent Labs  Lab 01/24/22 0440 01/26/22 0356  WBC 9.8 10.0  HGB 10.1* 9.9*  HCT 32.4* 31.9*  MCV 84.2  84.8  PLT 254 194   Basic Metabolic Panel: Recent Labs  Lab 01/24/22 0440 01/26/22 0356 01/29/22 0444  NA 137 139 138  K 3.2* 3.5 3.4*  CL 99 99 98  CO2 32 33* 32  GLUCOSE 117* 104* 96  BUN 17 20 28*  CREATININE 1.18* 1.03* 1.13*  CALCIUM 8.6* 9.1 8.7*  MG 2.0 2.0 2.0   GFR: Estimated Creatinine Clearance: 66.4 mL/min (A) (by C-G formula based on SCr of 1.13 mg/dL (H)). Liver Function Tests: No results for input(s): AST, ALT, ALKPHOS, BILITOT, PROT, ALBUMIN in the last 168 hours. No results for input(s): LIPASE, AMYLASE in the last 168 hours. No results for input(s): AMMONIA in the last 168 hours. Coagulation Profile: No results for input(s): INR, PROTIME in the last 168 hours. Cardiac Enzymes: No results for input(s): CKTOTAL, CKMB, CKMBINDEX, TROPONINI in the last 168 hours. BNP (last 3 results) Recent Labs    08/29/21 0931  PROBNP 180.0*   HbA1C: No results for input(s): HGBA1C in the last 72 hours. CBG: No results for input(s): GLUCAP in the last 168 hours. Lipid Profile: No results for input(s): CHOL, HDL, LDLCALC, TRIG, CHOLHDL, LDLDIRECT in the last 72 hours. Thyroid Function Tests: No results for input(s): TSH, T4TOTAL, FREET4, T3FREE, THYROIDAB in the last 72 hours. Anemia Panel: No results for input(s): VITAMINB12, FOLATE, FERRITIN, TIBC, IRON, RETICCTPCT in the last 72 hours. Sepsis Labs: No results for input(s): PROCALCITON, LATICACIDVEN in the last 168 hours.  No results found for this or any previous visit (from the past 240 hour(s)).       Radiology Studies: No results found.      Scheduled Meds:  aspirin  81 mg Oral Daily   divalproex  500 mg Oral Q12H   enoxaparin (LOVENOX) injection  40 mg Subcutaneous Q24H   pantoprazole  40 mg Oral QHS   QUEtiapine  25 mg Oral QHS   Continuous Infusions:   LOS: 12 days    Time spent: 28 minutes    Sharen Hones, MD [facility]   To contact the attending provider between 7A-7P or  the covering provider during after hours 7P-7A, please log into the web site [email or web address] and access using universal [place] password for that web site. If you do not have the password, please call the hospital operator.  01/30/2022, 10:14 AM

## 2022-01-30 NOTE — Discharge Summary (Signed)
Physician Discharge Summary   Patient: Morgan [name] MRN: [id number] DOB: Aug 14, 1951  Admit date:     01/18/2022  Discharge date: 01/30/22  Discharge Physician: Sharen Hones   PCP: [name], MD    Discharge Diagnoses: Principal Problem:   Acute ischemic stroke [facility]) Active Problems:   Essential hypertension   Chronic respiratory failure with hypoxia ([place])   Stage 3a chronic kidney disease ([place])   Hemiplegic migraine  Resolved Problems:   * No resolved hospital problems. *   Hospital Course: Morgan [name] is a 71 year old female with a past medical history significant for CHF, hypertension, mitral valve prolapse, Parkinson's disease, anemia, and arthritis who presented to [facility] ED on 01/18/2022 due to complaints of syncope, left-sided weakness/heaviness, left-sided facial droop, and left-sided visual field cut while at outpatient clinic getting a renal US.   Pt received TNK during stroke code and was admitted to ICU.  Subsequent MRI brain was neg for acute stroke.  Pt transferred to hospitalist service on 01/20/22.   Patient was seen by neurology, patient did not have a stroke.  She has hemiplegic migraine.  Assessment and Plan: Hemiplegic migraine with persistent left-sided weakness. Stroke ruled out. Parkinson disease. She has been evaluated by neurology, MRI has ruled out a stroke. Continue Depakote and as needed sumatriptan.   Patient still has a migraine headache almost every morning.  Continue sumatriptan as needed.  Also added Flonase as the patient seem to have nasal congestion. Her sleep is better after starting the Seroquel. Avoid Compazine, Phenergan or Reglan in the setting of Parkinson disease. Patient has been approved for nursing placement, will patient in stable condition.    Chronic kidney disease stage IIIa. AKI ruled out Mild hyponatremia Hypokalemia. Condition stable/improved.  COPD  Chronic hypoxemic respiratory failure No bronchospasm.   Anemia of  chronic disease. Continue to monitor.       Consultants: Neurology Procedures performed: None  Disposition: Skilled nursing facility Diet recommendation:  Discharge Diet Orders (From admission, onward)     Start     Ordered   01/30/22 0000  Diet - low sodium heart healthy        01/30/22 1053           Cardiac diet  DISCHARGE MEDICATION: Allergies as of 01/30/2022       Reactions   Meperidine Hives, Anaphylaxis   Strawberry Extract Hives, Swelling   Sucralfate Nausea Only, Other (See Comments)   Severe nausea Other reaction(s): Other (See Comments) Severe nausea Severe nausea   Wasp Venom Hives        Medication List     STOP taking these medications    albuterol 108 (90 Base) MCG/ACT inhaler Commonly known as: VENTOLIN HFA   alendronate 70 MG tablet Commonly known as: FOSAMAX   cetirizine 10 MG tablet Commonly known as: ZyrTEC Allergy   furosemide 40 MG tablet Commonly known as: LASIX   ibuprofen 600 MG tablet Commonly known as: ADVIL   Klor-Con M20 20 MEQ tablet Generic drug: potassium chloride SA   linaclotide 145 MCG Caps capsule Commonly known as: LINZESS   meloxicam 7.5 MG tablet Commonly known as: MOBIC   methocarbamol 500 MG tablet Commonly known as: ROBAXIN   nitrofurantoin (macrocrystal-monohydrate) 100 MG capsule Commonly known as: MACROBID   predniSONE 20 MG tablet Commonly known as: DELTASONE   valsartan 320 MG tablet Commonly known as: DIOVAN       TAKE these medications    acetaminophen 325 MG tablet Commonly  known as: TYLENOL Take 2 tablets (650 mg total) by mouth every 4 (four) hours as needed for mild pain (or temp > 37.5 C (99.5 F)).   aspirin-acetaminophen-caffeine 250-250-65 MG tablet Commonly known as: EXCEDRIN MIGRAINE Take 1-2 tablets by mouth every 6 (six) hours as needed for headache.   atorvastatin 40 MG tablet Commonly known as: LIPITOR Take 1 tablet (40 mg total) by mouth daily.    carbidopa-levodopa 25-100 MG tablet Commonly known as: SINEMET IR Take 1 tablet by mouth 3 (three) times daily.   carvedilol 12.5 MG tablet Commonly known as: COREG Take 12.5 mg by mouth 2 (two) times daily.   divalproex 500 MG DR tablet Commonly known as: DEPAKOTE Take 1 tablet (500 mg total) by mouth every 12 (twelve) hours.   FLUoxetine 40 MG capsule Commonly known as: PROZAC Take 40 mg by mouth daily.   fluticasone 50 MCG/ACT nasal spray Commonly known as: FLONASE Place 1 spray into both nostrils daily.   fluticasone-salmeterol 115-21 MCG/ACT inhaler Commonly known as: ADVAIR HFA Inhale 2 puffs into the lungs 2 (two) times daily.   gabapentin 400 MG capsule Commonly known as: NEURONTIN Take 400 mg by mouth 3 (three) times daily as needed (for nerve pain).   hydrOXYzine 25 MG tablet Commonly known as: ATARAX Take 25 mg by mouth every 8 (eight) hours as needed for anxiety.   mirtazapine 15 MG tablet Commonly known as: REMERON Take 15 mg by mouth at bedtime.   omeprazole 40 MG capsule Commonly known as: PRILOSEC TAKE 1 CAPSULE BY MOUTH EVERY DAY What changed: how much to take   ondansetron 4 MG disintegrating tablet Commonly known as: ZOFRAN-ODT Take 1-2 tablets by mouth every 6 hours as needed   OXYGEN Inhale 3 L/min into the lungs See admin instructions. 3 L/min at bedtime and as needed for shortness of breath throughout the day   polyethylene glycol-electrolytes 420 g solution Commonly known as: NuLYTELY Take 4,000 mLs by mouth as directed.   QUEtiapine 25 MG tablet Commonly known as: SEROQUEL Take 1 tablet (25 mg total) by mouth at bedtime.   SUMAtriptan 50 MG tablet Commonly known as: IMITREX Take 1 tablet (50 mg total) by mouth 2 (two) times daily as needed for migraine or headache. May repeat in 2 hours if headache persists or recurs.        Follow-up Information     [name], MD Follow up in 1 week(s).   Specialty: Family  Medicine Contact information: [place] [address] [facility] Follow up in 2 week(s).   Contact information: [place] [phone]                Discharge Exam: Danley Danker Weights   01/18/22 1054 01/18/22 1100 01/18/22 1700  Weight: 107.5 kg 107.5 kg 107.6 kg   General exam: Appears calm and comfortable  Respiratory system: Clear to auscultation. Respiratory effort normal. Cardiovascular system: S1 & S2 heard, RRR. No JVD, murmurs, rubs, gallops or clicks. No pedal edema. Gastrointestinal system: Abdomen is nondistended, soft and nontender. No organomegaly or masses felt. Normal bowel sounds heard. Central nervous system: Alert and oriented x3. No focal neurological deficits. Extremities: Symmetric 5 x 5 power. Skin: No rashes, lesions or ulcers Psychiatry: Judgement and insight appear normal. Mood & affect appropriate.    Condition at discharge: fair  The results of significant diagnostics  from this hospitalization (including imaging, microbiology, ancillary and laboratory) are listed below for reference.   Imaging Studies: CT ABDOMEN PELVIS WO CONTRAST  Result Date: 01/18/2022 CLINICAL DATA:  Acute abdominal pain, headache, T NK administered today after potential stroke symptoms. EXAM: CT ABDOMEN AND PELVIS WITHOUT CONTRAST TECHNIQUE: Multidetector CT imaging of the abdomen and pelvis was performed following the standard protocol without IV contrast. RADIATION DOSE REDUCTION: This exam was performed according to the departmental dose-optimization program which includes automated exposure control, adjustment of the mA and/or kV according to patient size and/or use of iterative reconstruction technique. COMPARISON:  02/05/2021 FINDINGS: Lower chest: Descending thoracic aortic atherosclerotic calcification. Small amount of methacrylate along pulmonary vasculature  in the left lower lobe. Hepatobiliary: Cholecystectomy.  Otherwise unremarkable. Pancreas: Unremarkable Spleen: Unremarkable Adrenals/Urinary Tract: There is excreted contrast medium in the renal collecting systems, ureters, and urinary bladder related to prior contrast administration. Adrenal glands and kidneys appear otherwise unremarkable. Stomach/Bowel: Sigmoid colon diverticulosis. Scattered diverticula elsewhere in the colon. No dilated bowel. Vascular/Lymphatic: Atherosclerosis is present, including aortoiliac atherosclerotic disease. Reproductive: Uterus absent.  Adnexa unremarkable. Other: No supplemental non-categorized findings. Musculoskeletal: Vertebral augmentations at T8 and T9. IMPRESSION: 1. No acute findings. 2. Vertebral augmentations at T9 and likely T8, with a trace amount of methacrylate along left lower lobe pulmonary arterial structures on the top most image (this is usually clinically inconsequential). 3. Sigmoid colon diverticulosis. 4. Aortic Atherosclerosis (ICD10-I70.0). Peripheral atherosclerosis. Electronically Signed   By: Van Clines M.D.   On: 01/18/2022 16:00   CT HEAD WO CONTRAST (5MM)  Result Date: 01/19/2022 CLINICAL DATA:  Stroke, follow up EXAM: CT HEAD WITHOUT CONTRAST TECHNIQUE: Contiguous axial images were obtained from the base of the skull through the vertex without intravenous contrast. RADIATION DOSE REDUCTION: This exam was performed according to the departmental dose-optimization program which includes automated exposure control, adjustment of the mA and/or kV according to patient size and/or use of iterative reconstruction technique. COMPARISON:  January 18, 2022. FINDINGS: Brain: No evidence of acute infarction, hemorrhage, hydrocephalus, extra-axial collection or mass lesion/mass effect. Vascular: No hyperdense vessel identified. Skull: No acute fracture. Sinuses/Orbits: Clear sinuses.  Unremarkable orbits. Other: No mastoid effusions. IMPRESSION: No  evidence of acute abnormality. Electronically Signed   By: Margaretha Sheffield M.D.   On: 01/19/2022 12:12   CT HEAD WO CONTRAST (5MM)  Result Date: 01/18/2022 CLINICAL DATA:  Dizziness following renal ultrasound today. Left extremity numbness and tingling with left facial weakness. Status post TNK. EXAM: CT HEAD WITHOUT CONTRAST TECHNIQUE: Contiguous axial images were obtained from the base of the skull through the vertex without intravenous contrast. RADIATION DOSE REDUCTION: This exam was performed according to the departmental dose-optimization program which includes automated exposure control, adjustment of the mA and/or kV according to patient size and/or use of iterative reconstruction technique. COMPARISON:  Multiple exams, including 01/18/2022 at 11:07 a.m. FINDINGS: Brain: The brainstem, cerebellum, cerebral peduncles, thalami, basal ganglia, basilar cisterns, and ventricular system appear within normal limits. No intracranial hemorrhage, mass lesion, or acute CVA. Vascular: No hyperdense vessel observed. Skull: Unremarkable Sinuses/Orbits: Mild mucosal thickening in the ethmoid air cells suggesting mild chronic sinusitis. Other: No supplemental non-categorized findings. IMPRESSION: 1. Stable appearance from earlier today, with no specific CT findings of acute CVA or acute intracranial abnormality. 2. Mild chronic ethmoid sinusitis. Electronically Signed   By: Van Clines M.D.   On: 01/18/2022 15:46   MR BRAIN WO CONTRAST  Result Date: 01/18/2022 CLINICAL DATA:  Syncope after renal ultrasound, left  arm face, and leg numbness and tingling EXAM: MRI HEAD WITHOUT CONTRAST TECHNIQUE: Multiplanar, multiecho pulse sequences of the brain and surrounding structures were obtained without intravenous contrast. COMPARISON:  Same-day CT/CTA head and neck FINDINGS: Brain: There is no evidence of acute intracranial hemorrhage, extra-axial fluid collection, or acute infarct. Parenchymal volume is normal. The  ventricles are normal in size. There are a few scattered small foci of FLAIR signal abnormality in the subcortical and periventricular white matter, nonspecific but likely reflecting sequela of mild chronic white matter microangiopathy. There is no suspicious parenchymal signal abnormality. There is no mass lesion. There is no midline shift. Vascular: Normal flow voids. Skull and upper cervical spine: Normal marrow signal. Sinuses/Orbits: The paranasal sinuses are clear. The globes and orbits are unremarkable. Other: None. IMPRESSION: No acute intracranial pathology. Electronically Signed   By: Valetta Mole M.D.   On: 01/18/2022 16:35   MR BRAIN W WO CONTRAST  Result Date: 01/20/2022 CLINICAL DATA:  TIA, left-sided weakness and left hemi field cut EXAM: MRI HEAD AND ORBITS WITHOUT AND WITH CONTRAST TECHNIQUE: Multiplanar, multiecho pulse sequences of the brain and surrounding structures were obtained without and with intravenous contrast. Multiplanar, multiecho pulse sequences of the orbits and surrounding structures were obtained including fat saturation techniques, before and after intravenous contrast administration. CONTRAST:  62mL GADAVIST GADOBUTROL 1 MMOL/ML IV SOLN COMPARISON:  01/18/2022 MRI brain FINDINGS: MRI HEAD FINDINGS Evaluation is limited by motion artifact. Brain: No restricted diffusion to suggest acute or subacute infarct. No acute hemorrhage, mass, mass effect, or midline shift. No hemosiderin deposition to suggest remote hemorrhage. No hydrocephalus or extra-axial collection. Scattered T2 hyperintense signal in the periventricular white matter, likely the sequela of minimal chronic small vessel ischemic disease. No abnormal parenchymal or meningeal enhancement. Vascular: Normal flow voids and opacified vasculature. Skull and upper cervical spine: Normal marrow signal. Other: None. MRI ORBITS FINDINGS Evaluation is limited by motion artifact. Orbits: No traumatic or inflammatory finding.  Globes, optic nerves, orbital fat, extraocular muscles, vascular structures, and lacrimal glands are normal. Visualized sinuses: Clear. Soft tissues: Negative. IMPRESSION: Evaluation is limited by motion artifact. Within this limitation, no acute intracranial or orbital process. Electronically Signed   By: Merilyn Baba M.D.   On: 01/20/2022 22:57   MR CERVICAL SPINE W WO CONTRAST  Result Date: 01/22/2022 CLINICAL DATA:  Left upper extremity weakness EXAM: MRI CERVICAL AND THORACIC SPINE WITHOUT AND WITH CONTRAST TECHNIQUE: Multiplanar and multiecho pulse sequences of the cervical spine, to include the craniocervical junction and cervicothoracic junction, and the thoracic spine, were obtained without and with intravenous contrast. CONTRAST:  64mL GADAVIST GADOBUTROL 1 MMOL/ML IV SOLN COMPARISON:  MRI thoracic spine 08/18/2021, MRI cervical spine 10/05/2017. FINDINGS: MRI CERVICAL SPINE FINDINGS Evaluation is somewhat limited by motion artifact. Alignment: Trace anterolisthesis C3 on C4, unchanged. Vertebrae: No acute fracture or suspicious osseous lesion. No abnormal enhancement. Cord: Normal signal and morphology.  No abnormal enhancement. Posterior Fossa, vertebral arteries, paraspinal tissues: Negative. Disc levels: C2-C3: No significant disc bulge. Mild facet arthropathy. No spinal canal stenosis or neuroforaminal narrowing. C3-C4: Trace anterolisthesis. Left-greater-than-right facet and uncovertebral hypertrophy. No spinal canal stenosis. Moderate left neural foraminal narrowing, unchanged. C4-C5: No significant disc bulge. Facet and uncovertebral hypertrophy. No spinal canal stenosis. Mild left-greater-than-right neural foraminal narrowing. C5-C6: Minimal disc bulge. Facet and uncovertebral hypertrophy. No spinal canal stenosis. Mild bilateral neural foraminal narrowing. C6-C7: No significant disc bulge. Left-greater-than-right facet and uncovertebral hypertrophy. No spinal canal stenosis. Mild left neural  foraminal narrowing.  C7-T1: No significant disc bulge. No spinal canal stenosis or neuroforaminal narrowing. MRI THORACIC SPINE FINDINGS Alignment:  Physiologic. Vertebrae: Redemonstrated compression deformities of T9 and T10, which are both status post kyphoplasty, without additional vertebral body height loss. Persistent edema is noted in these vertebral bodies, with increased T2 signal and enhancement, although it is decreased compared to the prior exam. No acute fracture. No abnormal enhancement. Cord:  Normal signal and morphology.  No abnormal enhancement. Paraspinal and other soft tissues: Negative. Disc levels: No significant spinal canal stenosis. Small disc bulges, the most prominent of which is a left subarticular protrusion at T11-T12, unchanged. No significant neural foraminal narrowing. IMPRESSION: 1. No acute fracture in the cervical or thoracic spine. Redemonstrated compression deformities of T9 and T10, status post kyphoplasty, without additional vertebral body height loss but with some persistent edema. 2. No spinal canal stenosis in the cervical or thoracic spine. 3. Multilevel uncovertebral and facet hypertrophy in the cervical spine, which causes moderate left neural foraminal narrowing at C3-C4, mild bilateral neural foraminal narrowing at C4-C5 and C5-C6, and mild left neural foraminal narrowing at C6-C7, which appear overall unchanged compared to 10/05/2017. Electronically Signed   By: Merilyn Baba M.D.   On: 01/22/2022 00:55   MR THORACIC SPINE W WO CONTRAST  Result Date: 01/22/2022 CLINICAL DATA:  Left upper extremity weakness EXAM: MRI CERVICAL AND THORACIC SPINE WITHOUT AND WITH CONTRAST TECHNIQUE: Multiplanar and multiecho pulse sequences of the cervical spine, to include the craniocervical junction and cervicothoracic junction, and the thoracic spine, were obtained without and with intravenous contrast. CONTRAST:  57mL GADAVIST GADOBUTROL 1 MMOL/ML IV SOLN COMPARISON:  MRI thoracic  spine 08/18/2021, MRI cervical spine 10/05/2017. FINDINGS: MRI CERVICAL SPINE FINDINGS Evaluation is somewhat limited by motion artifact. Alignment: Trace anterolisthesis C3 on C4, unchanged. Vertebrae: No acute fracture or suspicious osseous lesion. No abnormal enhancement. Cord: Normal signal and morphology.  No abnormal enhancement. Posterior Fossa, vertebral arteries, paraspinal tissues: Negative. Disc levels: C2-C3: No significant disc bulge. Mild facet arthropathy. No spinal canal stenosis or neuroforaminal narrowing. C3-C4: Trace anterolisthesis. Left-greater-than-right facet and uncovertebral hypertrophy. No spinal canal stenosis. Moderate left neural foraminal narrowing, unchanged. C4-C5: No significant disc bulge. Facet and uncovertebral hypertrophy. No spinal canal stenosis. Mild left-greater-than-right neural foraminal narrowing. C5-C6: Minimal disc bulge. Facet and uncovertebral hypertrophy. No spinal canal stenosis. Mild bilateral neural foraminal narrowing. C6-C7: No significant disc bulge. Left-greater-than-right facet and uncovertebral hypertrophy. No spinal canal stenosis. Mild left neural foraminal narrowing. C7-T1: No significant disc bulge. No spinal canal stenosis or neuroforaminal narrowing. MRI THORACIC SPINE FINDINGS Alignment:  Physiologic. Vertebrae: Redemonstrated compression deformities of T9 and T10, which are both status post kyphoplasty, without additional vertebral body height loss. Persistent edema is noted in these vertebral bodies, with increased T2 signal and enhancement, although it is decreased compared to the prior exam. No acute fracture. No abnormal enhancement. Cord:  Normal signal and morphology.  No abnormal enhancement. Paraspinal and other soft tissues: Negative. Disc levels: No significant spinal canal stenosis. Small disc bulges, the most prominent of which is a left subarticular protrusion at T11-T12, unchanged. No significant neural foraminal narrowing. IMPRESSION:  1. No acute fracture in the cervical or thoracic spine. Redemonstrated compression deformities of T9 and T10, status post kyphoplasty, without additional vertebral body height loss but with some persistent edema. 2. No spinal canal stenosis in the cervical or thoracic spine. 3. Multilevel uncovertebral and facet hypertrophy in the cervical spine, which causes moderate left neural foraminal narrowing at C3-C4,  mild bilateral neural foraminal narrowing at C4-C5 and C5-C6, and mild left neural foraminal narrowing at C6-C7, which appear overall unchanged compared to 10/05/2017. Electronically Signed   By: Merilyn Baba M.D.   On: 01/22/2022 00:55   US RENAL  Result Date: 01/18/2022 CLINICAL DATA:  Stage III B chronic kidney disease EXAM: RENAL / URINARY TRACT ULTRASOUND COMPLETE COMPARISON:  Abdominal CT 02/05/2021 FINDINGS: Right Kidney: Renal measurements: 10.3 by 4.0 by 4.4 cm = volume: 94 mL. Echogenicity within normal limits. No mass or hydronephrosis visualized. Left Kidney: Renal measurements: 10.9 by 4.3 by 4.2 cm = volume: 102 mL. Echogenicity within normal limits. No mass or hydronephrosis visualized. Bladder: Appears normal for degree of bladder distention. Other: None. IMPRESSION: 1. No hydronephrosis or renal calculi observed. Renal echogenicity appears normal currently. Electronically Signed   By: Van Clines M.D.   On: 01/18/2022 15:54   ECHOCARDIOGRAM COMPLETE  Result Date: 01/19/2022    ECHOCARDIOGRAM REPORT   Patient Name:   GRACIELLA ARMENT Date of Exam: 01/19/2022 Medical Rec #:  [id number]     Height:       73.0 in Accession #:    [id number]    Weight:       237.2 lb Date of Birth:  Mar 23, 1951      BSA:          2.313 m Patient Age:    71 years      BP:           132/72 mmHg Patient Gender: F             HR:           68 bpm. Exam Location:  [facility] Procedure: 2D Echo, Color Doppler and Cardiac Doppler Indications:     I63.9 Stroke  History:         Patient has prior history of Echocardiogram  examinations. CHF;                  Risk Factors:Hypertension.  Sonographer:     Charmayne Sheer Referring Phys:  [id number] Bradly Bienenstock Diagnosing Phys: Donnelly Angelica  Sonographer Comments: No parasternal window and suboptimal subcostal window. IMPRESSIONS  1. Left ventricular ejection fraction, by estimation, is 60 to 65%. The left ventricle has normal function. The left ventricle has no regional wall motion abnormalities. Left ventricular diastolic parameters are consistent with Grade I diastolic dysfunction (impaired relaxation).  2. Right ventricular systolic function is normal. The right ventricular size is normal.  3. The mitral valve is normal in structure. Trivial mitral valve regurgitation. No evidence of mitral stenosis.  4. The aortic valve is normal in structure. Aortic valve regurgitation is not visualized. No aortic stenosis is present.  5. The inferior vena cava is normal in size with <50% respiratory variability, suggesting right atrial pressure of 8 mmHg. FINDINGS  Left Ventricle: Left ventricular ejection fraction, by estimation, is 60 to 65%. The left ventricle has normal function. The left ventricle has no regional wall motion abnormalities. The left ventricular internal cavity size was normal in size. There is  no left ventricular hypertrophy. Left ventricular diastolic parameters are consistent with Grade I diastolic dysfunction (impaired relaxation). Right Ventricle: The right ventricular size is normal. No increase in right ventricular wall thickness. Right ventricular systolic function is normal. Left Atrium: Left atrial size was normal in size. Right Atrium: Right atrial size was normal in size. Pericardium: There is no evidence of pericardial effusion. Mitral Valve: The mitral valve is  normal in structure. Trivial mitral valve regurgitation. No evidence of mitral valve stenosis. MV peak gradient, 6.4 mmHg. The mean mitral valve gradient is 3.0 mmHg. Tricuspid Valve: The tricuspid valve is  normal in structure. Tricuspid valve regurgitation is not demonstrated. No evidence of tricuspid stenosis. Aortic Valve: The aortic valve is normal in structure. Aortic valve regurgitation is not visualized. No aortic stenosis is present. Aortic valve mean gradient measures 6.0 mmHg. Aortic valve peak gradient measures 12.5 mmHg. Pulmonic Valve: The pulmonic valve was normal in structure. Pulmonic valve regurgitation is not visualized. No evidence of pulmonic stenosis. Aorta: The aortic root is normal in size and structure. Venous: The inferior vena cava is normal in size with less than 50% respiratory variability, suggesting right atrial pressure of 8 mmHg. IAS/Shunts: The interatrial septum was not well visualized.   LV Volumes (MOD) LV vol d, MOD A2C: 72.4 ml Diastology LV vol d, MOD A4C: 93.2 ml LV e' medial:    5.11 cm/s LV vol s, MOD A2C: 27.3 ml LV E/e' medial:  17.7 LV vol s, MOD A4C: 26.7 ml LV e' lateral:   5.22 cm/s LV SV MOD A2C:     45.1 ml LV E/e' lateral: 17.3 LV SV MOD A4C:     93.2 ml LV SV MOD BP:      57.8 ml RIGHT VENTRICLE RV Basal diam:  2.55 cm RV S prime:     12.20 cm/s LEFT ATRIUM             Index        RIGHT ATRIUM          Index LA Vol (A2C):   19.9 ml 8.60 ml/m   RA Area:     9.10 cm LA Vol (A4C):   33.6 ml 14.52 ml/m  RA Volume:   16.50 ml 7.13 ml/m LA Biplane Vol: 28.7 ml 12.41 ml/m  AORTIC VALVE AV Vmax:           177.00 cm/s AV Vmean:          120.000 cm/s AV VTI:            0.384 m AV [place] Grad:      12.5 mmHg AV Mean Grad:      6.0 mmHg LVOT Vmax:         116.00 cm/s LVOT Vmean:        77.000 cm/s LVOT VTI:          0.264 m LVOT/AV VTI ratio: 0.69 MITRAL VALVE MV Area (PHT): 3.42 cm     SHUNTS MV Peak grad:  6.4 mmHg     Systemic VTI: 0.26 m MV Mean grad:  3.0 mmHg MV Vmax:       1.26 m/s MV Vmean:      75.3 cm/s MV Decel Time: 222 msec MV E velocity: 90.40 cm/s MV A velocity: 123.00 cm/s MV E/A ratio:  0.73 Donnelly Angelica Electronically signed by Donnelly Angelica Signature Date/Time:  01/19/2022/1:31:15 PM    Final    CT HEAD CODE STROKE WO CONTRAST`  Result Date: 01/18/2022 CLINICAL DATA:  Code stroke.  Neuro deficit, acute, stroke suspected EXAM: CT HEAD WITHOUT CONTRAST TECHNIQUE: Contiguous axial images were obtained from the base of the skull through the vertex without intravenous contrast. RADIATION DOSE REDUCTION: This exam was performed according to the departmental dose-optimization program which includes automated exposure control, adjustment of the mA and/or kV according to patient size and/or use of iterative reconstruction technique. COMPARISON:  CT head 07/01/2021. FINDINGS: Brain: No evidence of acute large vascular territory infarction, acute hemorrhage, hydrocephalus, extra-axial collection or mass lesion/mass effect. Vascular: No hyperdense vessel identified. Calcific intracranial atherosclerosis. Skull: No acute fracture. Sinuses/Orbits: Clear visualized sinuses.  Unremarkable orbits. Other: No mastoid effusions. ASPECTS [facility] Stroke Program Early CT Score) total score (0-10 with 10 being normal): 10. IMPRESSION: No evidence of acute large vascular territory infarct or acute hemorrhage. ASPECTS is 10. Code stroke imaging results were communicated on 01/18/2022 at 11:12 am to provider [facility] via secure text paging. Electronically Signed   By: Margaretha Sheffield M.D.   On: 01/18/2022 11:14   CT ANGIO HEAD NECK W WO CM (CODE STROKE)  Result Date: 01/18/2022 CLINICAL DATA:  Neuro deficit, acute, stroke suspected EXAM: CT ANGIOGRAPHY HEAD AND NECK TECHNIQUE: Multidetector CT imaging of the head and neck was performed using the standard protocol during bolus administration of intravenous contrast. Multiplanar CT image reconstructions and MIPs were obtained to evaluate the vascular anatomy. Carotid stenosis measurements (when applicable) are obtained utilizing NASCET criteria, using the distal internal carotid diameter as the denominator. RADIATION DOSE REDUCTION: This exam was  performed according to the departmental dose-optimization program which includes automated exposure control, adjustment of the mA and/or kV according to patient size and/or use of iterative reconstruction technique. CONTRAST:  38mL OMNIPAQUE IOHEXOL 350 MG/ML SOLN COMPARISON:  None. FINDINGS: CTA NECK Aortic arch: Great vessel origins are patent with calcified plaque present. Right carotid system: Patent. Mixed but predominantly calcified plaque at the proximal ICA causing 60% stenosis. Left carotid system: Patent. Calcified plaque along the proximal ICA causing 50% stenosis. Vertebral arteries: Patent and codominant.  No stenosis. Skeleton: Degenerative changes of the cervical spine. Other neck: Unremarkable. Upper chest: No apical lung mass. Review of the MIP images confirms the above findings CTA HEAD Anterior circulation: Intracranial internal carotid arteries are patent with mild calcified plaque. Anterior and middle cerebral arteries are patent. Posterior circulation: Intracranial vertebral arteries are patent. Basilar artery is patent. Major cerebellar artery origins are patent. Posterior cerebral arteries are patent. Venous sinuses: Patent as allowed by contrast bolus timing. Review of the MIP images confirms the above findings IMPRESSION: No large vessel occlusion. Plaque at the right ICA origin causes approximately 60% stenosis. Plaque at the left ICA origin causes approximately 50% stenosis. Electronically Signed   By: Macy Mis M.D.   On: 01/18/2022 11:49   MR ORBITS W WO CONTRAST  Result Date: 01/20/2022 CLINICAL DATA:  TIA, left-sided weakness and left hemi field cut EXAM: MRI HEAD AND ORBITS WITHOUT AND WITH CONTRAST TECHNIQUE: Multiplanar, multiecho pulse sequences of the brain and surrounding structures were obtained without and with intravenous contrast. Multiplanar, multiecho pulse sequences of the orbits and surrounding structures were obtained including fat saturation techniques, before  and after intravenous contrast administration. CONTRAST:  68mL GADAVIST GADOBUTROL 1 MMOL/ML IV SOLN COMPARISON:  01/18/2022 MRI brain FINDINGS: MRI HEAD FINDINGS Evaluation is limited by motion artifact. Brain: No restricted diffusion to suggest acute or subacute infarct. No acute hemorrhage, mass, mass effect, or midline shift. No hemosiderin deposition to suggest remote hemorrhage. No hydrocephalus or extra-axial collection. Scattered T2 hyperintense signal in the periventricular white matter, likely the sequela of minimal chronic small vessel ischemic disease. No abnormal parenchymal or meningeal enhancement. Vascular: Normal flow voids and opacified vasculature. Skull and upper cervical spine: Normal marrow signal. Other: None. MRI ORBITS FINDINGS Evaluation is limited by motion artifact. Orbits: No traumatic or inflammatory finding. Globes, optic nerves, orbital fat,  extraocular muscles, vascular structures, and lacrimal glands are normal. Visualized sinuses: Clear. Soft tissues: Negative. IMPRESSION: Evaluation is limited by motion artifact. Within this limitation, no acute intracranial or orbital process. Electronically Signed   By: Merilyn Baba M.D.   On: 01/20/2022 22:57    Microbiology: Results for orders placed or performed during the hospital encounter of 01/18/22  Resp Panel by RT-PCR (Flu A&B, Covid) Nasopharyngeal Swab     Status: None   Collection Time: 01/18/22 12:50 PM   Specimen: Nasopharyngeal Swab; Nasopharyngeal(NP) swabs in vial transport medium  Result Value Ref Range Status   SARS Coronavirus 2 by RT PCR NEGATIVE NEGATIVE Final    Comment: (NOTE) SARS-CoV-2 target nucleic acids are NOT DETECTED.  The SARS-CoV-2 RNA is generally detectable in upper respiratory specimens during the acute phase of infection. The lowest concentration of SARS-CoV-2 viral copies this assay can detect is 138 copies/mL. A negative result does not preclude SARS-Cov-2 infection and should not be used  as the sole basis for treatment or other patient management decisions. A negative result may occur with  improper specimen collection/handling, submission of specimen other than nasopharyngeal swab, presence of viral mutation(s) within the areas targeted by this assay, and inadequate number of viral copies(<138 copies/mL). A negative result must be combined with clinical observations, patient history, and epidemiological information. The expected result is Negative.  Fact Sheet for Patients:  EntrepreneurPulse.com.au  Fact Sheet for Healthcare Providers:  IncredibleEmployment.be  This test is no t yet approved or cleared by the [place] FDA and  has been authorized for detection and/or diagnosis of SARS-CoV-2 by FDA under an Emergency Use Authorization (EUA). This EUA will remain  in effect (meaning this test can be used) for the duration of the COVID-19 declaration under Section 564(b)(1) of the Act, 21 U.S.C.section 360bbb-3(b)(1), unless the authorization is terminated  or revoked sooner.       Influenza A by PCR NEGATIVE NEGATIVE Final   Influenza B by PCR NEGATIVE NEGATIVE Final    Comment: (NOTE) The Xpert Xpress SARS-CoV-2/FLU/RSV plus assay is intended as an aid in the diagnosis of influenza from Nasopharyngeal swab specimens and should not be used as a sole basis for treatment. Nasal washings and aspirates are unacceptable for Xpert Xpress SARS-CoV-2/FLU/RSV testing.  Fact Sheet for Patients: EntrepreneurPulse.com.au  Fact Sheet for Healthcare Providers: IncredibleEmployment.be  This test is not yet approved or cleared by the [place] FDA and has been authorized for detection and/or diagnosis of SARS-CoV-2 by FDA under an Emergency Use Authorization (EUA). This EUA will remain in effect (meaning this test can be used) for the duration of the COVID-19 declaration under Section 564(b)(1)  of the Act, 21 U.S.C. section 360bbb-3(b)(1), unless the authorization is terminated or revoked.  Performed at [facility], [address] [place], [place] [address]   Urine Culture     Status: Abnormal   Collection Time: 01/18/22 12:58 PM   Specimen: Urine, Clean Catch  Result Value Ref Range Status   Specimen Description   Final    URINE, CLEAN CATCH Performed at [facility], [address] [place], [place] [address]    Special Requests   Final    NONE Performed at [facility], [address] [place], [place] [address]    Culture >=100,000 COLONIES/mL ESCHERICHIA COLI (A)  Final   Report Status 01/21/2022 FINAL  Final   Organism ID, Bacteria ESCHERICHIA COLI (A)  Final      Susceptibility  Escherichia coli - MIC*    AMPICILLIN 8 SENSITIVE Sensitive     CEFAZOLIN <=4 SENSITIVE Sensitive     CEFEPIME <=0.12 SENSITIVE Sensitive     CEFTRIAXONE <=0.25 SENSITIVE Sensitive     CIPROFLOXACIN <=0.25 SENSITIVE Sensitive     GENTAMICIN <=1 SENSITIVE Sensitive     IMIPENEM <=0.25 SENSITIVE Sensitive     NITROFURANTOIN <=16 SENSITIVE Sensitive     TRIMETH/SULFA <=20 SENSITIVE Sensitive     AMPICILLIN/SULBACTAM 4 SENSITIVE Sensitive     PIP/TAZO <=4 SENSITIVE Sensitive     * >=100,000 COLONIES/mL ESCHERICHIA COLI  MRSA Next Gen by PCR, Nasal     Status: None   Collection Time: 01/18/22  4:59 PM   Specimen: Nasal Mucosa; Nasal Swab  Result Value Ref Range Status   MRSA by PCR Next Gen NOT DETECTED NOT DETECTED Final    Comment: (NOTE) The GeneXpert MRSA Assay (FDA approved for NASAL specimens only), is one component of a comprehensive MRSA colonization surveillance program. It is not intended to diagnose MRSA infection nor to guide or monitor treatment for MRSA infections. Test performance is not FDA approved in patients less than 19 years old. Performed at [facility], [address] [place], [place] [address]      Labs: CBC: Recent Labs  Lab 01/24/22 0440 01/26/22 0356  WBC 9.8 10.0  HGB 10.1* 9.9*  HCT 32.4* 31.9*  MCV 84.2 84.8  PLT 254 720   Basic Metabolic Panel: Recent Labs  Lab 01/24/22 0440 01/26/22 0356 01/29/22 0444  NA 137 139 138  K 3.2* 3.5 3.4*  CL 99 99 98  CO2 32 33* 32  GLUCOSE 117* 104* 96  BUN 17 20 28*  CREATININE 1.18* 1.03* 1.13*  CALCIUM 8.6* 9.1 8.7*  MG 2.0 2.0 2.0   Liver Function Tests: No results for input(s): AST, ALT, ALKPHOS, BILITOT, PROT, ALBUMIN in the last 168 hours. CBG: No results for input(s): GLUCAP in the last 168 hours.  Discharge time spent: greater than 30 minutes.  Signed: Sharen Hones, MD [facility] 01/30/2022

## 2022-01-30 NOTE — Progress Notes (Signed)
Report called to Caryl Pina RN at [facility], EMS has been called for transport.

## 2022-01-30 NOTE — Progress Notes (Signed)
Occupational Therapy Treatment Patient Details Name: Morgan [name] MRN: [id number] DOB: 1950-12-28 Today's Date: 01/30/2022   History of present illness Pt is a 71 y/o F who presented to the ED with c/o syncope, L sided weakness/heaviness, & L sided facial droop. Code stroke was called & pt received TNK at 1119; pt was admitted to the ICU for further work up & monitoring. Initial MRI was negative for acute processes. PMH: CHF, HTN, mitral valve prolapse, e. 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 She continues to sit in dark room with TV on. OT turns off TV as well if pt is indeed having the level of pain she reports. Pt performs bed mobility with supervision and min cuing for technique. Pt standing with min A and use of RW. Pt is able to pull down soiled brief and perform hygiene with min guard- min A for standing balance. Pt returns to sitting on EOB and threads new mesh brief over B feet with min guard and places incontinence pad. Pt declined transfer to toilet this session. Pt stands to pull brief over B hips with min A and transfers to recliner chair with min guard and use of RW. All needs within reach and pt remains in chair with chair alarm activated.    Recommendations for follow up therapy are one component of a multi-disciplinary discharge planning process, led by the attending physician.  Recommendations may be updated based on patient status, additional functional criteria and insurance authorization.    Follow Up Recommendations  Skilled nursing-short term rehab (<3 hours/day)    Assistance Recommended at Discharge Intermittent Supervision/Assistance  Patient can return home with the following  A lot of help with walking and/or transfers;A lot of help with bathing/dressing/bathroom;Assistance with cooking/housework;Assist for transportation;Help with stairs or ramp for  entrance;Direct supervision/assist for medications management   Equipment Recommendations  BSC/3in1;Other (comment) (RW)       Precautions / Restrictions Precautions Precautions: Fall Restrictions Weight Bearing Restrictions: No       Mobility Bed Mobility Overal bed mobility: Needs Assistance Bed Mobility: Supine to Sit     Supine to sit: Supervision     General bed mobility comments: increased time and effort but no physical assistance    Transfers Overall transfer level: Needs assistance Equipment used: Rolling walker (2 wheels) Transfers: Sit to/from Stand, Bed to chair/wheelchair/BSC Sit to Stand: Min assist     Step pivot transfers: Min assist           Balance Overall balance assessment: Needs assistance Sitting-balance support: Feet supported, Single extremity supported Sitting balance-Leahy Scale: Good       Standing balance-Leahy Scale: Fair Standing balance comment: able to attend to self care/pericare in standing                           ADL either performed or assessed with clinical judgement   ADL Overall ADL's : Needs assistance/impaired                     Lower Body Dressing: Sit to/from stand;Min guard Lower Body Dressing Details (indicate cue type and reason): for balance on EOB and in standing for LB clothing management   Toilet Transfer Details (indicate cue type and reason): Pt declined  Cognition Arousal/Alertness: Awake/alert Behavior During Therapy: WFL for tasks assessed/performed Overall Cognitive Status: Within Functional Limits for tasks assessed                                 General Comments: Pt is A and O but continues to c/o severe headaches. OT turning off loud TV in room to create calm and quiet environment.                   Pertinent Vitals/ Pain       Pain Assessment Pain Assessment: 0-10 Pain Score: 6  Pain Location: headache Pain Descriptors /  Indicators: Headache Pain Intervention(s): Limited activity within patient's tolerance, Monitored during session, Repositioned         Frequency  Min 2X/week        Progress Toward Goals  OT Goals(current goals can now be found in the care plan section)  Progress towards OT goals: Progressing toward goals  Acute Rehab OT Goals Patient Stated Goal: to go home OT Goal Formulation: With patient Time For Goal Achievement: 02/02/22 Potential to Achieve Goals: Good  Plan Discharge plan remains appropriate;Frequency remains appropriate       AM-PAC OT "6 Clicks" Daily Activity     Outcome Measure   Help from another person eating meals?: None Help from another person taking care of personal grooming?: A Little Help from another person toileting, which includes using toliet, bedpan, or urinal?: A Little Help from another person bathing (including washing, rinsing, drying)?: A Little Help from another person to put on and taking off regular upper body clothing?: None Help from another person to put on and taking off regular lower body clothing?: A Little 6 Click Score: 20    End of Session Equipment Utilized During Treatment: Rolling walker (2 wheels)  OT Visit Diagnosis: Other abnormalities of gait and mobility (R26.89);Hemiplegia and hemiparesis Hemiplegia - Right/Left: Left Hemiplegia - dominant/non-dominant: Dominant Hemiplegia - caused by: Unspecified   Activity Tolerance Patient tolerated treatment well   Patient Left in bed;with call bell/phone within reach   Nurse Communication Mobility status        Time: 9675-9163 OT Time Calculation (min): 23 min  Charges: OT General Charges $OT Visit: 1 Visit OT Treatments $Self Care/Home Management : 23-37 mins  Darleen Crocker, MS, OTR/L , CBIS ascom [phone]  01/30/22, 1:09 PM

## 2022-01-30 NOTE — TOC Progression Note (Signed)
Transition of Care [facility]) - Progression Note    Patient Details  Name: Morgan [name] MRN: [id number] Date of Birth: [birth year]  Transition of Care [facility]) CM/SW [place], RN Phone Number: 01/30/2022, 10:43 AM  Clinical Narrative:   Holland Falling approved [facility] [phone] from 2/14 to 2/16    Expected Discharge Plan: IP Rehab Facility Barriers to Discharge: Continued Medical Work up  Expected Discharge Plan and Services Expected Discharge Plan: [place]   Discharge Planning Services: CM Consult   Living arrangements for the past 2 months: Single Family Home                 DME Arranged: N/A DME Agency: NA                   Social Determinants of Health (SDOH) Interventions    Readmission Risk Interventions No flowsheet data found.

## 2022-01-30 NOTE — TOC Progression Note (Signed)
Transition of Care [facility]) - Progression Note    Patient Details  Name: EOLA WALDREP MRN: [id number] Date of Birth: 1951-04-18  Transition of Care [facility]) CM/SW [place], RN Phone Number: 01/30/2022, 1:22 PM  Clinical Narrative:   Patient is medically discharged, COVID negative, able to transfer to [facility] today.  Patient has already called her roommate to notify of transfer.  EMS will transport patient.    Expected Discharge Plan: IP Rehab Facility Barriers to Discharge: Continued Medical Work up  Expected Discharge Plan and Services Expected Discharge Plan: [place]   Discharge Planning Services: CM Consult   Living arrangements for the past 2 months: Single Family Home Expected Discharge Date: 01/30/22               DME Arranged: N/A DME Agency: NA                   Social Determinants of Health (SDOH) Interventions    Readmission Risk Interventions No flowsheet data found.

## 2022-01-31 DIAGNOSIS — G43419 Hemiplegic migraine, intractable, without status migrainosus: Secondary | ICD-10-CM | POA: Diagnosis not present

## 2022-01-31 DIAGNOSIS — G20 Parkinson's disease: Secondary | ICD-10-CM | POA: Diagnosis not present

## 2022-01-31 DIAGNOSIS — I509 Heart failure, unspecified: Secondary | ICD-10-CM | POA: Diagnosis not present

## 2022-01-31 DIAGNOSIS — J449 Chronic obstructive pulmonary disease, unspecified: Secondary | ICD-10-CM | POA: Diagnosis not present

## 2022-01-31 DIAGNOSIS — N183 Chronic kidney disease, stage 3 unspecified: Secondary | ICD-10-CM | POA: Diagnosis not present

## 2022-01-31 DIAGNOSIS — J9611 Chronic respiratory failure with hypoxia: Secondary | ICD-10-CM | POA: Diagnosis not present

## 2022-01-31 DIAGNOSIS — I10 Essential (primary) hypertension: Secondary | ICD-10-CM | POA: Diagnosis not present

## 2022-02-01 DIAGNOSIS — G4701 Insomnia due to medical condition: Secondary | ICD-10-CM | POA: Diagnosis not present

## 2022-02-01 DIAGNOSIS — K219 Gastro-esophageal reflux disease without esophagitis: Secondary | ICD-10-CM | POA: Diagnosis not present

## 2022-02-01 DIAGNOSIS — J9611 Chronic respiratory failure with hypoxia: Secondary | ICD-10-CM | POA: Diagnosis not present

## 2022-02-01 DIAGNOSIS — I739 Peripheral vascular disease, unspecified: Secondary | ICD-10-CM | POA: Diagnosis not present

## 2022-02-01 DIAGNOSIS — G43909 Migraine, unspecified, not intractable, without status migrainosus: Secondary | ICD-10-CM | POA: Diagnosis not present

## 2022-02-01 DIAGNOSIS — D638 Anemia in other chronic diseases classified elsewhere: Secondary | ICD-10-CM | POA: Diagnosis not present

## 2022-02-01 DIAGNOSIS — R69 Illness, unspecified: Secondary | ICD-10-CM | POA: Diagnosis not present

## 2022-02-01 DIAGNOSIS — K5900 Constipation, unspecified: Secondary | ICD-10-CM | POA: Diagnosis not present

## 2022-02-01 DIAGNOSIS — R110 Nausea: Secondary | ICD-10-CM | POA: Diagnosis not present

## 2022-02-01 DIAGNOSIS — G20 Parkinson's disease: Secondary | ICD-10-CM | POA: Diagnosis not present

## 2022-02-01 DIAGNOSIS — N182 Chronic kidney disease, stage 2 (mild): Secondary | ICD-10-CM | POA: Diagnosis not present

## 2022-02-01 DIAGNOSIS — G8192 Hemiplegia, unspecified affecting left dominant side: Secondary | ICD-10-CM | POA: Diagnosis not present

## 2022-02-02 ENCOUNTER — Telehealth: Payer: Medicare HMO

## 2022-02-02 NOTE — Progress Notes (Unsigned)
Chronic Care Management Pharmacy Note  02/02/2022 Name:  Morgan [name] MRN:  [id number] DOB:  12/22/1950  Summary: CCM F/U visit -Pt reports compliance with furosemide 40 mg and potassium 20 mEq once daily (before breakfast); she reports she is drinking fluids regularly; she denies s/sx of dehydration -Pt reports she has restarted atorvastatin, carvedilol and aspirin as advised last CCM visit -Pt reports she gets "jittery" when she takes albuterol and asked if Advair would cause this as well; Advair contains a LABA so it can potentially cause a similar side effect  Recommendations/Changes made from today's visit: -Recommend repeat lipid panel at next PCP or cardiology visit -Advised pt to take Advair twice daily and to contact PCP/PharmD if side effects occur; consider LAMA monotherapy (Spiriva, Incruse) if LABA is not tolerated  Plan: -[name] will call patient *** -Pharmacist follow up televisit scheduled for *** -PCP f/u due May 2023 (28-month; not yet scheduled   Subjective: Morgan [name] an 71y.o. year old female who is a primary patient of Cody, JJobe Marker MD.  The CCM team was consulted for assistance with disease management and care coordination needs.    Engaged with patient by telephone for follow up visit in response to provider referral for pharmacy case management and/or care coordination services.   Consent to Services:  The patient was given information about Chronic Care Management services, agreed to services, and gave verbal consent prior to initiation of services.  Please see initial visit note for detailed documentation.   Patient Care Team: CLesleigh Noe MD as PCP - General (Family Medicine) FCharlton Haws [facility]Global [facility] Hospitalas Pharmacist (Pharmacist) YEarlie Server MD as Consulting Physician (Hematology and Oncology)  Recent office visits: 10/27/2021 - RViviana Simpler MD - Patient presented for low blood pressure. Blood pressure reading in office was  118/78. Tremor highly suggestive of primary PD. Referral place to Neurology. Stop isosorbide. Stay on lower dose of Carvedilol (6.25 mg BID). D/C aspirin. 10/18/2021 - JWaunita Schooner MD - Orders only - Start: metronidazole 500 mg tablet - Take 1 tablet by mouth 2 times daily for 7 days due to bacterial vaginosis. 10/17/21 Dr CEinar PheasantOV: hospital f/u; start Advair HFA 115-21 BID  08/28/21 CLesleigh Noe MD- PCP: chronic f/u; CHF exacerbation - increase lasix to 40 mg BID x 5 days, start Kcl 20 meq w/ lasix. C/o pyloric stenosis, advised to contact GI, hold starting alendronate until sx resolve. Low HGB - referred to hematology.   06/26/21 CLesleigh Noe MD- PCP: acute visit s/p fall, back pain. Fell ~6/27. Rx'd oxycodone for pain. Referral to ortho for severe pain response.  Recent consult visits: 01/10/22 Dr KLanora Manis(nephrology): new consult CKD. Advised to stop NSAIDs (on ibuoprofen for back pain for years). Fluid restriction 1 L per day.  12/30/21 Urgent Care: Back pain - pulled a muscle. Pain 8/10. Rx'd methocarbamol.  12/21/21 Dr YTasia Catchings(oncology): f/u IDA. Rec another dose of Venofer, goal ferritin > 200. No need for ESA (Hgb > 10). Referred to nephrology.  11/27/2021 - HJennings Books MD - Neurology - Telephone -Pt was having N/V/D with Sinemet 1 tab TID. Side effects resolved with lower dose 1/2 tab TID.  11/23/2021 - BSerafina Royals MD - Cardiology - Patient presented for four week follow up for congestive heart failure, hypertension and fatigue. No medication changes.   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 Ordered: MRI brain without contrast. Ordered: Sleep deprived  EEG. Order: Physical Therapy. Start: Carbidopa/levodopa 25/100 mg - 1 tab three times a day. Recommended patient not drive due to "white-out" spells.   10/29/2021 - [facility] - UTI. Rx'd Macrobid.   10/26/2021 - Serafina Royals, MD - Cardiology - Telephone -  Patient called she is fatigued and shaky since starting Carvedilol. Change: Take half a dose BID of Carvedilol due to side effects.  10/25/2021 - Serafina Royals, MD - Cardiology - Patient presented for 6 month follow up for dizziness and chest pain. Started isosorbide 30 mg daily and increased carvedilol to 12.5 mg BID.  09/20/21 Urgent Care: sinusitis - rx'd Augmentin, prednisone 20 mg. Low suspicion COPD exacerbation. 09/12/21 Urgent care: c/o sore throat. Dx viral URI. Covid, strep negative.  Meds: cetirizine 10 mg daily, and prednisone 30 mg daily 09/11/21 [name], MD (Heme/onc): new pt - eval IDA. RTC 2 weeks, +/- Venofer. (Avoid oral iron d/t chronic constipation) 09/06/21  Locklear, [name], MD (GI [name]): f/u chronic nausea, dysphagia. Ordered barium swallow, NM gastric emptying study 07/31/21 [name], MD (ortho - [name]): f/u T10 compression fx 07/19/21 [name], MD  (ortho [name]): f/u T10 compression fx. Schedule kyphoplasty tomorrow and vertebroplasty soon 07/05/21 [name], PA (Ortho [name]): initial visit for compression fracture. 06/12/21 Urgent Care - Pneumonia, rib pain. Rx'd Augmentin, Doxycycline, ibuprofen, tizanidine. 05/11/21 Flossie Dibble, MD (cardiology - [name]): f/u HTN, aortic aneurysm. Increase valsartan to 320 mg daily.   Hospital visits: Medication Reconciliation was completed by comparing discharge summary, patients EMR and Pharmacy list, and upon discussion with patient.  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 Discharge date was 01/30/22. Discharged from [facility].   -received TNK during Code Stroke, admitted to ICU. MRI brain negative for acute stroke. 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 Discharged to SNF. -avoid compazine, phenergan, reglan in s/o Parkinson's  New?Medications Started at [facility] Discharge:?? -started Depakote, Quetiapine,  sumatriptan due to migraine  Medications Discontinued at Hospital Discharge: -Stopped albuterol, alendronate, cetirizine, furosemide, ibuprofen, klor-con, Linzess, meloxicam, methocarbamol, valsartan  Medications that remain the same after Hospital Discharge:??  -All other medications will remain the same.    Admitted to the hospital on 10/05/21 due to Hypokalemia. Discharge date was 10/07/21. Discharged from [facility].   -pt was not taking potassium due to not wanting to take it; also had n/v -overdiuresis (recently increased lasix to 40 mg BID)  Medication Changes at Hospital Discharge: -Changed Lasix to 40 mg daily w/ Kcl 20 mEq daily  Medications that remain the same after Hospital Discharge:??  -All other medications will remain the same.    Admitted to the hospital on 08/23/21 due to T9 Kyphoplasty. Discharge date was 08/23/21. Discharged from [facility].    Admitted to the hospital on 07/20/21 due to T10 kyphoplasty. Discharge date was 07/20/21. Discharged from [facility].    Admitted to the ED on 07/01/21 due to fall. Discharge date was 07/01/21. Discharged from [place] and CT negative for acute abnormalities  Objective:  Lab Results  Component Value Date   CREATININE 1.13 (H) 01/29/2022   BUN 28 (H) 01/29/2022   GFR 40.43 (L) 10/17/2021   GFRNONAA 53 (L) 01/29/2022   GFRAA >60 09/02/2020   NA 138 01/29/2022   K 3.4 (L) 01/29/2022   CALCIUM 8.7 (L) 01/29/2022   CO2 32 01/29/2022   GLUCOSE 96 01/29/2022  Lab Results  Component Value Date/Time   HGBA1C 5.2 01/19/2022 04:43 AM   HGBA1C 5.8 (H) 10/05/2017 03:50 AM   GFR 40.43 (L) 10/17/2021 02:48 PM   GFR 57.33 (L) 08/29/2021 09:31 AM    Last diabetic Eye exam: No results found for: HMDIABEYEEXA  Last diabetic Foot exam: No results found for: HMDIABFOOTEX   Lab Results  Component Value Date   CHOL 132 01/19/2022   HDL 54  01/19/2022   LDLCALC 63 01/19/2022   TRIG 77 01/19/2022   CHOLHDL 2.4 01/19/2022    Hepatic Function Latest Ref Rng & Units 01/18/2022 10/06/2021 10/05/2021  Total Protein 6.5 - 8.1 g/dL 6.1(L) 6.0(L) 6.6  Albumin 3.5 - 5.0 g/dL 3.2(L) 2.8(L) 3.0(L)  AST 15 - 41 U/L 14(L) 16 23  ALT 0 - 44 U/L _0 Alk Phosphatase 38 - 126 U/L 108 98 109  Total Bilirubin 0.3 - 1.2 mg/dL 0.3 0.6 0.8  Bilirubin, Direct 0.0 - 0.2 mg/dL - - -    Lab Results  Component Value Date/Time   TSH 2.46 08/29/2021 09:31 AM   TSH 4.591 (H) 08/10/2020 05:49 AM   TSH 1.89 12/26/2018 12:00 AM   FREET4 1.01 08/29/2021 09:31 AM    CBC Latest Ref Rng & Units 01/26/2022 01/24/2022 01/23/2022  WBC 4.0 - 10.5 K/uL 10.0 9.8 9.9  Hemoglobin 12.0 - 15.0 g/dL 9.9(L) 10.1(L) 10.2(L)  Hematocrit 36.0 - 46.0 % 31.9(L) 32.4(L) 33.1(L)  Platelets 150 - 400 K/uL 245 254 250   Iron/TIBC/Ferritin/ %Sat    Component Value Date/Time   IRON 57 12/19/2021 1336   TIBC 300 12/19/2021 1336   FERRITIN 187 12/19/2021 1336   IRONPCTSAT 19 12/19/2021 1336    Lab Results  Component Value Date/Time   VD25OH 38.85 08/29/2021 09:31 AM    Clinical ASCVD: Yes  The ASCVD Risk score (Arnett DK, et al., 2019) failed to calculate for the following reasons:   The patient has a prior MI or stroke diagnosis    Depression screen [facility] 2/9 10/17/2021 08/28/2021 11/12/2018  Decreased Interest 0 1 0  Down, Depressed, Hopeless 0 1 0  PHQ - 2 Score 0 2 0  Altered sleeping - 1 -  Tired, decreased energy - 1 -  Change in appetite - 1 -  Feeling bad or failure about yourself  - 1 -  Trouble concentrating - 1 -  Moving slowly or fidgety/restless - 1 -  Suicidal thoughts - 0 -  PHQ-9 Score - 8 -  Difficult doing work/chores - Somewhat difficult -  Some recent data might be hidden     Social History   Tobacco Use  Smoking Status Former   Packs/day: 1.00   Years: 15.00   Pack years: 15.00   Types: Cigarettes   Quit date: 09/12/2017   Years  since quitting: 4.3  Smokeless Tobacco Never   BP Readings from Last 3 Encounters:  01/30/22 129/61  01/18/22 131/76  12/30/21 110/70   Pulse Readings from Last 3 Encounters:  01/30/22 70  01/18/22 73  12/30/21 78   Wt Readings from Last 3 Encounters:  01/18/22 237 lb 3.4 oz (107.6 kg)  12/21/21 226 lb 3.2 oz (102.6 kg)  12/05/21 236 lb (107 kg)   BMI Readings from Last 3 Encounters:  01/18/22 31.30 kg/m  12/21/21 29.84 kg/m  12/05/21 31.14 kg/m    Assessment/Interventions: Review of patient past medical history, allergies, medications, health status, including review of consultants reports, laboratory and other test  data, was performed as part of comprehensive evaluation and provision of chronic care management services.   SDOH:  (Social Determinants of Health) assessments and interventions performed: Yes  SDOH Screenings   Alcohol Screen: Not on file  Depression (PHQ2-9): Low Risk    PHQ-2 Score: 0  Financial Resource Strain: Not on file  Food Insecurity: Not on file  Housing: Not on file  Physical Activity: Not on file  Social Connections: Not on file  Stress: Not on file  Tobacco Use: Medium Risk   Smoking Tobacco Use: Former   Smokeless Tobacco Use: Never   Passive Exposure: Not on file  Transportation Needs: Not on file    CCM Care Plan  Allergies  Allergen Reactions   Meperidine Hives and Anaphylaxis   Strawberry Extract Hives and Swelling   Sucralfate Nausea Only and Other (See Comments)    Severe nausea Other reaction(s): Other (See Comments) Severe nausea Severe nausea   Wasp Venom Hives    Medications Reviewed Today     Reviewed by Arby Barrette, CPhT (Pharmacy Technician) on 01/18/22 at 1643  Med List Status: Complete   Medication Order Taking? Sig Documenting Provider Last Dose Status Informant  albuterol (VENTOLIN HFA) 108 (90 Base) MCG/ACT inhaler [id number] No Inhale 1-2 puffs into the lungs every 6 (six) hours as needed for  wheezing or shortness of breath.  Patient not taking: Reported on 01/18/2022   Arturo Morton Not Taking Active Self  alendronate (FOSAMAX) 70 MG tablet [id number] No Take 1 tablet (70 mg total) by mouth every 7 (seven) days. Take with a full glass of water on an empty stomach.  Patient not taking: Reported on 01/18/2022   [name], MD Not Taking Active            Med Note Malena Catholic Oct 18, 2021  1:37 PM)    aspirin-acetaminophen-caffeine [facility] MIGRAINE) [phone] MG tablet [id number] No Take 1-2 tablets by mouth every 6 (six) hours as needed for headache. [provider] prn unknown Active Self  atorvastatin (LIPITOR) 40 MG tablet [id number] Yes Take 1 tablet (40 mg total) by mouth daily. [name], MD 01/18/2022 0530 Active            Med Note Charlton Haws   Wed Oct 18, 2021  1:37 PM)    carbidopa-levodopa (SINEMET IR) 25-100 MG tablet [id number] Yes Take 1 tablet by mouth 3 (three) times daily. [provider] 01/18/2022 0530 Active   carvedilol (COREG) 12.5 MG tablet [id number] Yes Take 12.5 mg by mouth 2 (two) times daily. [provider] 01/18/2022 0530 Active   cetirizine (ZYRTEC ALLERGY) 10 MG tablet [id number] No Take 1 tablet (10 mg total) by mouth daily.  Patient not taking: Reported on 01/18/2022   Jaynee Eagles, PA-C Not Taking Active   FLUoxetine (PROZAC) 40 MG capsule [id number] Yes Take 40 mg by mouth daily. [provider] 01/18/2022 0530 Active Self           Med Note Lenord Fellers, Cleaster Corin   Wed Oct 18, 2021  1:37 PM)    fluticasone-salmeterol (ADVAIR [facility]) 115-21 MCG/ACT inhaler [id number] No Inhale 2 puffs into the lungs 2 (two) times daily. [name], MD prn unknown Active   furosemide (LASIX) 40 MG tablet [id number] Yes Take 1 tablet (40 mg total) by mouth daily. Sidney Ace, MD 01/18/2022 0530 Active   gabapentin (NEURONTIN) 400 MG capsule [id number] Yes Take 400 mg by  mouth 3 (three) times daily as needed  (for nerve pain). [provider] 01/18/2022 0530 Active Self           Med Note Lenord Fellers, Cleaster Corin   Wed Oct 18, 2021  1:37 PM)    hydrOXYzine (ATARAX/VISTARIL) 25 MG tablet [id number] Yes Take 25 mg by mouth every 8 (eight) hours as needed for anxiety. [provider] 01/18/2022 0530 Active Self           Med Note Lenord Fellers, Cleaster Corin   Wed Oct 18, 2021  1:37 PM)    ibuprofen (ADVIL) 600 MG tablet [id number] No Take 1 tablet (600 mg total) by mouth every 6 (six) hours as needed.  Patient not taking: Reported on 01/18/2022   Melynda Ripple, MD Not Taking Active   KLOR-CON M20 20 MEQ tablet [id number] Yes TAKE 1 TABLET BY MOUTH EVERY DAY [name], MD 01/18/2022 0530 Active   linaclotide (LINZESS) 145 MCG CAPS capsule [id number] No Take 145 mcg by mouth daily before breakfast.  Patient not taking: Reported on 11/03/2021   [provider] Not Taking Active   meloxicam (MOBIC) 7.5 MG tablet [id number] No Take 7.5 mg by mouth daily.  Patient not taking: Reported on 01/18/2022   [provider] Not Taking Active            Med Note Malena Catholic Oct 18, 2021  1:37 PM)    methocarbamol (ROBAXIN) 500 MG tablet [id number] No Take 1 tablet (500 mg total) by mouth 2 (two) times daily as needed for muscle spasms.  Patient not taking: Reported on 01/18/2022   Teodora Medici, FNP Not Taking Active   mirtazapine (REMERON) 15 MG tablet [id number] Yes Take 15 mg by mouth at bedtime. [provider] 01/17/2022 2000 Active Self           Med Note Lenord Fellers, Cleaster Corin   Wed Oct 18, 2021  1:37 PM)    nitrofurantoin, macrocrystal-monohydrate, (MACROBID) 100 MG capsule [id number] No Take 1 capsule (100 mg total) by mouth 2 (two) times daily.  Patient not taking: Reported on 01/18/2022   Francene Finders, PA-C Not Taking Active   omeprazole (PRILOSEC) 40 MG capsule [id number] Yes TAKE 1 CAPSULE BY MOUTH EVERY DAY  Patient taking differently: Take 40 mg by mouth  daily.   [name], MD 01/18/2022 0530 Active            Med Note Charlton Haws   Wed Oct 18, 2021  1:37 PM)    ondansetron (ZOFRAN-ODT) 4 MG disintegrating tablet [id number] Yes Take 1-2 tablets by mouth every 6 hours as needed Levin Erp, [place] 01/18/2022 0530 Active Self  OXYGEN [id number]  Inhale 3 L/min into the lungs See admin instructions. 3 L/min at bedtime and as needed for shortness of breath throughout the day [provider]  Active Self  polyethylene glycol-electrolytes (NULYTELY) 420 g solution [id number] No Take 4,000 mLs by mouth as directed.  Patient not taking: Reported on 01/18/2022   [provider] Not Taking Active            Med Note Malena Catholic Oct 18, 2021  1:37 PM)    predniSONE (DELTASONE) 20 MG tablet [id number] No Take 2 tablets daily with breakfast.  Patient not taking: Reported on 01/18/2022   Jaynee Eagles, PA-C Not Taking Active   valsartan (DIOVAN) 320 MG tablet [id number] Yes Take 320 mg by mouth daily.  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   Acute ischemic stroke ([place]) 01/18/2022   Hypotension 10/27/2021   Tremor 10/27/2021   Microcytic anemia 09/11/2021   Stage 3a chronic kidney disease ([place]) 09/11/2021   IDA (iron deficiency anemia) 09/11/2021   Elevated TSH 08/28/2021   Osteopenia 08/28/2021   DOE (dyspnea on exertion) 08/28/2021   Acute midline thoracic back pain 06/26/2021   Other chronic pain 06/26/2021   Chest pressure 02/20/2021   Left sided abdominal pain 02/01/2021   Chronic diastolic CHF (congestive heart failure) ([place]) 08/09/2020   COPD (chronic obstructive pulmonary disease) ([place]) 08/09/2020   Chronic respiratory failure with hypoxia ([place]) 08/09/2020   Polyp of sigmoid colon    Polyp of cecum    Gastritis and  gastroduodenitis    Pylorus ulcer    Pyloric stenosis in adult    Chronic cholecystitis 05/19/2020   Oxygen dependent 03/30/2020   Nausea and vomiting in adult 03/22/2020   Peptic ulcer disease 03/22/2020   Iron deficiency anemia 03/22/2020   Acute pain of right knee 12/29/2019   Acute left-sided low back pain without sciatica 12/29/2019   Chronic abdominal pain 02/19/2019   Asymptomatic bacteriuria 02/19/2019   CKD (chronic kidney disease) stage 3, GFR 30-59 ml/min ([place]) 01/13/2019   CHF (congestive heart failure) ([place]) 01/13/2019   Dizziness 01/13/2019   Hoarseness 11/12/2018   Dysphagia 11/12/2018   Sleepiness 11/12/2018   CVA (cerebral vascular accident) ([place]) 08/22/2018   Left-sided weakness 10/04/2017   HLD (hyperlipidemia) 10/04/2017   GERD (gastroesophageal reflux disease) 10/04/2017   Anxiety 08/26/2017   Essential hypertension    Depression    Diarrhea 02/22/2015   Hypokalemia 02/22/2015   Chronic lower back pain 04/29/2013   Paresthesia 04/29/2013    Immunization History  Administered Date(s) Administered   Fluad Quad(high Dose 65+) 09/30/2019, 08/29/2020, 08/28/2021   Influenza, High Dose Seasonal PF 08/23/2018   Influenza,inj,Quad PF,6+ Mos 02/24/2015   Moderna Sars-Covid-2 Vaccination 03/18/2020, 04/15/2020   Pneumococcal Conjugate-13 11/12/2018   Pneumococcal Polysaccharide-23 02/24/2015    Conditions to be addressed/monitored:  Hypertension, Hyperlipidemia, Heart Failure, GERD, COPD, Chronic Kidney Disease, Depression, Anxiety, and Osteoporosis, hx CVA  There are no care plans that you recently modified to display for this patient.    Medication Assistance: None required.  Patient affirms current coverage meets needs.  Compliance/Adherence/Medication fill history: Care Gaps: Mammogram (07/16/19)  Star-Rating Drugs: Valsartan - LF11/27/22 x 90 ds; PDC 99% Atorvastatin - LF 12/24/21 x 90 ds; [place] 65%  Patient's preferred pharmacy is:  [id number] [place] [place]Phone: [phone]: [address]Phone: [phone]: [phone]  Uses pill box? No - prefers bottles Pt endorses 100% compliance  We discussed: Current pharmacy is preferred with insurance plan and patient is satisfied with pharmacy services Patient decided to: Continue current medication management strategy  Care Plan and Follow Up Patient Decision:  Patient agrees to Care Plan and Follow-up.  Plan: Telephone follow up appointment with care management team member scheduled for:  3 month  Charlene Brooke, PharmD, BCACP Clinical Pharmacist [facility] [phone]   Current Barriers:  Unable to independently monitor therapeutic efficacy Suboptimal therapeutic regimen for HTN/heart failure and HLD  Pharmacist Clinical Goal(s):  Patient will achieve adherence to monitoring guidelines and medication adherence to achieve therapeutic efficacy adhere to plan to optimize therapeutic regimen for HTN/HF and HLD as evidenced by report of adherence to recommended medication management changes through collaboration with PharmD and provider.   Interventions: 1:1 collaboration with [name], MD regarding development and update of comprehensive plan of care as evidenced by provider attestation and co-signature Inter-disciplinary care team collaboration (see longitudinal plan of care) Comprehensive medication review performed; medication list updated in electronic medical record  Hyperlipidemia: (LDL goal < 70) -Not ideally controlled - pt was previously off of statin therapy for close to a year (pt forgot to refill); she has restarted atorvastatin and aspirin Oct 2022 -Hx CVA -Current treatment:  Atorvastatin 40 mg daily Aspirin 81 mg daily  ? -Current exercise habits: none - pain and SOB -Educated on Cholesterol goals; Benefits of statin for ASCVD risk reduction; given history of CVA it is recommended she take a high-intensity statin and aspirin -Recommend to continue current medication, repeat lipid panel with next PCP/cardiology visit  Heart Failure / Hypertension (Goal: BP < 140/90 and prevent exacerbations) -Improving - Pt reports compliance with medications as below; she takes furosemide and potassium together before breakfast; she reports she is drinking fluids regularly and denies s/sx of dehydration -Current home BP/HR readings: not checking -Last ejection fraction: 55-65% (Date: 08/23/2018) -HF type: Diastolic -Current treatment: Valsartan 320 mg daily - stopped in hospital Carvedilol 6.25 mg BID  Furosemide 40 mg daily - stopped in hospital Potassium chloride 20 meq daily w/ lasix - stopped in hospital -Medications previously tried: amlodipine, carvedilol, spironolactone, telmisartan, HCTZ, isosorbide -Educated on Benefits of medications for managing symptoms and prolonging life; Proper diuretic administration and potassium supplementation; Importance of blood pressure control -Counseled on limitations of office BP monitoring alone, benefits of home BP monitoring -Advised to check BP at home daily -Recommend to continue current medication  COPD (Goal: control symptoms and prevent exacerbations) -Not ideally controlled- pt was precribed Advair yesterday and has not started it yet; she reports she gets "jittery" with albuterol and wondered if that would happen with Advair as well -Current treatment  Albuterol HFA - stopped in hospital Advair HFA (fluticasone-salmeterol) 115-21 mcg/act 2 puff BID Cetirizine 10 mg daily Supplemental O2 (noctural) -Gold Grade: unknown (no PFTs on file) -Pulmonary function testing: not on file -Exacerbations requiring treatment in last 6 months: 1? -Frequency of rescue inhaler use:  daily -Advised trial of Advair BID - if not tolerated, consider LAMA monotherapy (Spiriva, Incruse)  Depression/Anxiety (Goal: manage symtoms) -Controlled - pt reports mood is under control, current doses are fine -PHQ9: 8 (08/2021) - mild depression -GAD7: not on file -Current treatment: Fluoxetine 40 mg - 1 cap daily Hydroxyzine 25 mg q8h PRN - takes 1 daily Mirtazapine 15 mg HS Quetiapine 25 mg HS - new -Medications previously tried/failed: Abilify, duloxetine, hydroxyzine -Recommended to continue current medication  Possible Parkinson's Disease (Goal: manage symptoms) -{US controlled/uncontrolled:25276} -Current treatment  Carbidopa/levodopa IR 25-100 mg 1/2 tab TID  -Medications previously tried: ***  -{CCMPHARMDINTERVENTION:25122}  Hemiplegic migraine (Goal: manage symptoms) -{US controlled/uncontrolled:25276} -Current treatment  Divalproex 500 mg q12h - new Sumatriptan 50 mg PRN - new Tylenol PRN Excedrin (ASA-caffeine-APAP) PRN -Medications previously tried: ***  -{CCMPHARMDINTERVENTION:25122}  Osteopenia (Goal prevent fractures) -Not ideally  controlled - pt has been prescribed alendronate but she not started it yet due to GI issues -Last DEXA Scan: 08/10/21  T-Score total hip: -2.0  T-Score lumbar spine: -1.6  10-year probability of major osteoporotic fracture: 17.6%  10-year probability of hip fracture: 3.0% -Patient is a candidate for pharmacologic treatment due to history of vertebral fracture and T-Score -1.0 to -2.5 and 10-year risk of hip fracture > 3% -Current treatment  Alendronate 70 mg weekly (prescribed 08/2021, not started yet) - stopped in hospital -Recommend [phone] units of vitamin D daily. Recommend 1200 mg of calcium daily from dietary and supplemental sources. Counseled on oral bisphosphonate administration: take in the morning, 30 minutes prior to food with 6-8 oz of water. Do not lie down for at least 30 minutes after taking.  Back pain (Goal:  manage pain) -Controlled -hx multiple falls, T10 compression fracture s/p kyphoplasty 07/20/21 -Current treatment: Gabapentin 400 mg TID prn Meloxicam 7.5 mg daily - stopped in hospital Ibuprofen 600 mg PRN - stopped in hospital -Recommended to continue current medication  GERD / dysphagia / PUD (Goal: manage symptoms) -Controlled -hx pyloric stenosis. Follows w/ GI. -Current treatment  Omeprazole 40 mg daily - 2 daily Ondansetron 4 mg PRN -Chronic PPI use is not ideal given bone density issues; however pt has severe symptoms if she does not take PPI -Recommended to continue current medication  Chronic constipation (Goal: manage symptoms) -Controlled - pt is not taking Linzess consistently (she was using samples);  -Current treatment  Linzess 145 mcg daily PRN - not taking -Recommended to continue current medication; advised to contact GI for additional samples if needed  Health Maintenance -Vaccine gaps: TDAP, Shingrix, covid booster -Current therapy:  Excedrine Migraine (ASA-APAP-caffeine) -Patient is satisfied with current therapy and denies issues -Recommended to continue current medication  Patient Goals/Self-Care Activities Patient will:  - take medications as prescribed focus on medication adherence by pill box check blood pressure daily -Start Advair twice a day; contact PCP or PharmD if jitteriness occurs

## 2022-02-05 DIAGNOSIS — I110 Hypertensive heart disease with heart failure: Secondary | ICD-10-CM | POA: Diagnosis not present

## 2022-02-05 DIAGNOSIS — I10 Essential (primary) hypertension: Secondary | ICD-10-CM | POA: Diagnosis not present

## 2022-02-05 DIAGNOSIS — K5900 Constipation, unspecified: Secondary | ICD-10-CM | POA: Diagnosis not present

## 2022-02-05 DIAGNOSIS — I509 Heart failure, unspecified: Secondary | ICD-10-CM | POA: Diagnosis not present

## 2022-02-05 DIAGNOSIS — G43419 Hemiplegic migraine, intractable, without status migrainosus: Secondary | ICD-10-CM | POA: Diagnosis not present

## 2022-02-05 DIAGNOSIS — N183 Chronic kidney disease, stage 3 unspecified: Secondary | ICD-10-CM | POA: Diagnosis not present

## 2022-02-05 DIAGNOSIS — E785 Hyperlipidemia, unspecified: Secondary | ICD-10-CM | POA: Diagnosis not present

## 2022-02-07 DIAGNOSIS — I10 Essential (primary) hypertension: Secondary | ICD-10-CM | POA: Diagnosis not present

## 2022-02-07 DIAGNOSIS — K5900 Constipation, unspecified: Secondary | ICD-10-CM | POA: Diagnosis not present

## 2022-02-07 DIAGNOSIS — G43419 Hemiplegic migraine, intractable, without status migrainosus: Secondary | ICD-10-CM | POA: Diagnosis not present

## 2022-02-07 DIAGNOSIS — E559 Vitamin D deficiency, unspecified: Secondary | ICD-10-CM | POA: Diagnosis not present

## 2022-02-07 DIAGNOSIS — I509 Heart failure, unspecified: Secondary | ICD-10-CM | POA: Diagnosis not present

## 2022-02-08 DIAGNOSIS — G43409 Hemiplegic migraine, not intractable, without status migrainosus: Secondary | ICD-10-CM | POA: Diagnosis not present

## 2022-02-08 DIAGNOSIS — I5032 Chronic diastolic (congestive) heart failure: Secondary | ICD-10-CM | POA: Diagnosis not present

## 2022-02-08 DIAGNOSIS — R1311 Dysphagia, oral phase: Secondary | ICD-10-CM | POA: Diagnosis not present

## 2022-02-08 DIAGNOSIS — G43419 Hemiplegic migraine, intractable, without status migrainosus: Secondary | ICD-10-CM | POA: Diagnosis not present

## 2022-02-08 DIAGNOSIS — G20 Parkinson's disease: Secondary | ICD-10-CM | POA: Diagnosis not present

## 2022-02-08 DIAGNOSIS — I509 Heart failure, unspecified: Secondary | ICD-10-CM | POA: Diagnosis not present

## 2022-02-08 DIAGNOSIS — I10 Essential (primary) hypertension: Secondary | ICD-10-CM | POA: Diagnosis not present

## 2022-02-08 DIAGNOSIS — M6259 Muscle wasting and atrophy, not elsewhere classified, multiple sites: Secondary | ICD-10-CM | POA: Diagnosis not present

## 2022-02-08 DIAGNOSIS — Z736 Limitation of activities due to disability: Secondary | ICD-10-CM | POA: Diagnosis not present

## 2022-02-08 DIAGNOSIS — J449 Chronic obstructive pulmonary disease, unspecified: Secondary | ICD-10-CM | POA: Diagnosis not present

## 2022-02-09 DIAGNOSIS — I10 Essential (primary) hypertension: Secondary | ICD-10-CM | POA: Diagnosis not present

## 2022-02-09 DIAGNOSIS — I509 Heart failure, unspecified: Secondary | ICD-10-CM | POA: Diagnosis not present

## 2022-02-09 DIAGNOSIS — G43419 Hemiplegic migraine, intractable, without status migrainosus: Secondary | ICD-10-CM | POA: Diagnosis not present

## 2022-02-09 DIAGNOSIS — G20 Parkinson's disease: Secondary | ICD-10-CM | POA: Diagnosis not present

## 2022-02-12 DIAGNOSIS — R202 Paresthesia of skin: Secondary | ICD-10-CM | POA: Diagnosis not present

## 2022-02-12 DIAGNOSIS — K5900 Constipation, unspecified: Secondary | ICD-10-CM | POA: Diagnosis not present

## 2022-02-12 DIAGNOSIS — F419 Anxiety disorder, unspecified: Secondary | ICD-10-CM | POA: Diagnosis not present

## 2022-02-12 DIAGNOSIS — Z7951 Long term (current) use of inhaled steroids: Secondary | ICD-10-CM | POA: Diagnosis not present

## 2022-02-12 DIAGNOSIS — I6529 Occlusion and stenosis of unspecified carotid artery: Secondary | ICD-10-CM | POA: Diagnosis not present

## 2022-02-12 DIAGNOSIS — I69352 Hemiplegia and hemiparesis following cerebral infarction affecting left dominant side: Secondary | ICD-10-CM | POA: Diagnosis not present

## 2022-02-12 DIAGNOSIS — Z87310 Personal history of (healed) osteoporosis fracture: Secondary | ICD-10-CM | POA: Diagnosis not present

## 2022-02-12 DIAGNOSIS — F32A Depression, unspecified: Secondary | ICD-10-CM | POA: Diagnosis not present

## 2022-02-12 DIAGNOSIS — H53462 Homonymous bilateral field defects, left side: Secondary | ICD-10-CM | POA: Diagnosis not present

## 2022-02-12 DIAGNOSIS — Z87891 Personal history of nicotine dependence: Secondary | ICD-10-CM | POA: Diagnosis not present

## 2022-02-12 DIAGNOSIS — I130 Hypertensive heart and chronic kidney disease with heart failure and stage 1 through stage 4 chronic kidney disease, or unspecified chronic kidney disease: Secondary | ICD-10-CM | POA: Diagnosis not present

## 2022-02-12 DIAGNOSIS — N182 Chronic kidney disease, stage 2 (mild): Secondary | ICD-10-CM | POA: Diagnosis not present

## 2022-02-12 DIAGNOSIS — I69312 Visuospatial deficit and spatial neglect following cerebral infarction: Secondary | ICD-10-CM | POA: Diagnosis not present

## 2022-02-12 DIAGNOSIS — G20 Parkinson's disease: Secondary | ICD-10-CM | POA: Diagnosis not present

## 2022-02-12 DIAGNOSIS — G43609 Persistent migraine aura with cerebral infarction, not intractable, without status migrainosus: Secondary | ICD-10-CM | POA: Diagnosis not present

## 2022-02-12 DIAGNOSIS — J449 Chronic obstructive pulmonary disease, unspecified: Secondary | ICD-10-CM | POA: Diagnosis not present

## 2022-02-12 DIAGNOSIS — M1990 Unspecified osteoarthritis, unspecified site: Secondary | ICD-10-CM | POA: Diagnosis not present

## 2022-02-12 DIAGNOSIS — K219 Gastro-esophageal reflux disease without esophagitis: Secondary | ICD-10-CM | POA: Diagnosis not present

## 2022-02-12 DIAGNOSIS — M810 Age-related osteoporosis without current pathological fracture: Secondary | ICD-10-CM | POA: Diagnosis not present

## 2022-02-12 DIAGNOSIS — I739 Peripheral vascular disease, unspecified: Secondary | ICD-10-CM | POA: Diagnosis not present

## 2022-02-12 DIAGNOSIS — I69398 Other sequelae of cerebral infarction: Secondary | ICD-10-CM | POA: Diagnosis not present

## 2022-02-12 DIAGNOSIS — I509 Heart failure, unspecified: Secondary | ICD-10-CM | POA: Diagnosis not present

## 2022-02-12 DIAGNOSIS — J9611 Chronic respiratory failure with hypoxia: Secondary | ICD-10-CM | POA: Diagnosis not present

## 2022-02-12 DIAGNOSIS — G4701 Insomnia due to medical condition: Secondary | ICD-10-CM | POA: Diagnosis not present

## 2022-02-12 DIAGNOSIS — D631 Anemia in chronic kidney disease: Secondary | ICD-10-CM | POA: Diagnosis not present

## 2022-02-14 ENCOUNTER — Telehealth: Payer: Self-pay | Admitting: Pharmacist

## 2022-02-14 ENCOUNTER — Telehealth: Payer: Medicare HMO

## 2022-02-14 NOTE — Telephone Encounter (Signed)
?  Chronic Care Management  ? ?Outreach Note ? ?02/14/2022 ?Name: BAILEA BEED MRN: [id number] DOB: June 01, 1951 ? ?Referred by: [name], MD ? ?Patient had a phone appointment scheduled with clinical pharmacist today. ? ?An unsuccessful telephone outreach was attempted today. The patient was referred to the pharmacist for assistance with medications, care management and care coordination.  ? ?Patient will NOT be penalized in any way for missing a CCM appointment. The no-show fee does not apply. ? ?If possible, a message was left to return call to: [phone] or to [facility]. ? ?Charlene Brooke, PharmD, BCACP ?Clinical Pharmacist ?[facility] ?[phone] ?

## 2022-02-14 NOTE — Progress Notes (Unsigned)
Chronic Care Management Pharmacy Note  02/14/2022 Name:  Morgan [name] MRN:  [id number] DOB:  Nov 20, 1951  Summary: CCM F/U visit -Pt reports compliance with furosemide 40 mg and potassium 20 mEq once daily (before breakfast); she reports she is drinking fluids regularly; she denies s/sx of dehydration -Pt reports she has restarted atorvastatin, carvedilol and aspirin as advised last CCM visit -Pt reports she gets "jittery" when she takes albuterol and asked if Advair would cause this as well; Advair contains a LABA so it can potentially cause a similar side effect  Recommendations/Changes made from today's visit: -Recommend repeat lipid panel at next PCP or cardiology visit -Advised pt to take Advair twice daily and to contact PCP/PharmD if side effects occur; consider LAMA monotherapy (Spiriva, Incruse) if LABA is not tolerated  Plan: -[facility] will call patient *** -Pharmacist follow up televisit scheduled for *** -PCP f/u due May 2023 (53-month; not yet scheduled   Subjective: Morgan WESSELLSis an 71y.o. year old female who is a primary patient of Cody, JJobe Marker MD.  The CCM team was consulted for assistance with disease management and care coordination needs.    Engaged with patient by telephone for follow up visit in response to provider referral for pharmacy case management and/or care coordination services.   Consent to Services:  The patient was given information about Chronic Care Management services, agreed to services, and gave verbal consent prior to initiation of services.  Please see initial visit note for detailed documentation.   Patient Care Team: CLesleigh Noe MD as PCP - General (Family Medicine) FCharlton Haws [facility] LPas Pharmacist (Pharmacist) YEarlie Server MD as Consulting Physician (Hematology and Oncology)  Recent office visits: 10/27/2021 - RViviana Simpler MD - Patient presented for low blood pressure. Blood pressure reading in office was  118/78. Tremor highly suggestive of primary PD. Referral place to Neurology. Stop isosorbide. Stay on lower dose of Carvedilol (6.25 mg BID). D/C aspirin. 10/18/2021 - JWaunita Schooner MD - Orders only - Start: metronidazole 500 mg tablet - Take 1 tablet by mouth 2 times daily for 7 days due to bacterial vaginosis. 10/17/21 Dr CEinar PheasantOV: hospital f/u; start Advair HFA 115-21 BID  08/28/21 CLesleigh Noe MD- PCP: chronic f/u; CHF exacerbation - increase lasix to 40 mg BID x 5 days, start Kcl 20 meq w/ lasix. C/o pyloric stenosis, advised to contact GI, hold starting alendronate until sx resolve. Low HGB - referred to hematology.   06/26/21 CLesleigh Noe MD- PCP: acute visit s/p fall, back pain. Fell ~6/27. Rx'd oxycodone for pain. Referral to ortho for severe pain response.  Recent consult visits: 01/10/22 Dr KLanora Manis(nephrology): new consult CKD. Advised to stop NSAIDs (on ibuoprofen for back pain for years). Fluid restriction 1 L per day.  12/30/21 Urgent Care: Back pain - pulled a muscle. Pain 8/10. Rx'd methocarbamol.  12/21/21 Dr YTasia Catchings(oncology): f/u IDA. Rec another dose of Venofer, goal ferritin > 200. No need for ESA (Hgb > 10). Referred to nephrology.  11/27/2021 - HJennings Books MD - Neurology - Telephone -Pt was having N/V/D with Sinemet 1 tab TID. Side effects resolved with lower dose 1/2 tab TID.  11/23/2021 - BSerafina Royals MD - Cardiology - Patient presented for four week follow up for congestive heart failure, hypertension and fatigue. No medication changes.   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 Ordered: MRI brain without contrast. Ordered: Sleep deprived  EEG. Order: Physical Therapy. Start: Carbidopa/levodopa 25/100 mg - 1 tab three times a day. Recommended patient not drive due to "white-out" spells.   10/29/2021 - [facility] - UTI. Rx'd Macrobid.  10/26/2021 - Serafina Royals, MD - Cardiology - Telephone - Patient  called she is fatigued and shaky since starting Carvedilol. Change: Take half a dose BID of Carvedilol due to side effects. 10/25/2021 - Serafina Royals, MD - Cardiology - Patient presented for 6 month follow up for dizziness and chest pain. Started isosorbide 30 mg daily and increased carvedilol to 12.5 mg BID. 09/20/21 Urgent Care: sinusitis - rx'd Augmentin, prednisone 20 mg. Low suspicion COPD exacerbation. 09/12/21 Urgent care: c/o sore throat. Dx viral URI. Covid, strep negative.  Meds: cetirizine 10 mg daily, and prednisone 30 mg daily 09/11/21 [name], MD (Heme/onc): new pt - eval IDA. RTC 2 weeks, +/- Venofer. (Avoid oral iron d/t chronic constipation) 09/06/21  Locklear, [name], MD (GI [name]): f/u chronic nausea, dysphagia. Ordered barium swallow, NM gastric emptying study 07/31/21 [name], MD (ortho - [name]): f/u T10 compression fx 07/19/21 [name], MD  (ortho [name]): f/u T10 compression fx. Schedule kyphoplasty tomorrow and vertebroplasty soon 07/05/21 [name], PA (Ortho [name]): initial visit for compression fracture. 06/12/21 Urgent Care - Pneumonia, rib pain. Rx'd Augmentin, Doxycycline, ibuprofen, tizanidine. 05/11/21 Flossie Dibble, MD (cardiology - [name]): f/u HTN, aortic aneurysm. Increase valsartan to 320 mg daily.   Hospital visits: Medication Reconciliation was completed by comparing discharge summary, patients EMR and Pharmacy list, and upon discussion with patient.  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 Discharge date was 01/30/22. Discharged from [facility].   -received TNK during Code Stroke, admitted to ICU. MRI brain negative for acute stroke. 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 Discharged to SNF. -avoid compazine, phenergan, reglan in s/o Parkinson's  New?Medications Started at [facility] Discharge:?? -started Depakote, Quetiapine, sumatriptan due to  migraine  Medications Discontinued at Hospital Discharge: -Stopped albuterol, alendronate, cetirizine, furosemide, ibuprofen, klor-con, Linzess, meloxicam, methocarbamol, valsartan  Medications that remain the same after Hospital Discharge:??  -All other medications will remain the same.    Admitted to the hospital on 10/05/21 due to Hypokalemia. Discharge date was 10/07/21. Discharged from [facility].   -pt was not taking potassium due to not wanting to take it; also had n/v -overdiuresis (recently increased lasix to 40 mg BID)  Medication Changes at Hospital Discharge: -Changed Lasix to 40 mg daily w/ Kcl 20 mEq daily  Medications that remain the same after Hospital Discharge:??  -All other medications will remain the same.    Admitted to the hospital on 08/23/21 due to T9 Kyphoplasty. Discharge date was 08/23/21. Discharged from [facility].    Admitted to the hospital on 07/20/21 due to T10 kyphoplasty. Discharge date was 07/20/21. Discharged from [facility].    Admitted to the ED on 07/01/21 due to fall. Discharge date was 07/01/21. Discharged from [place] and CT negative for acute abnormalities  Objective:  Lab Results  Component Value Date   CREATININE 1.13 (H) 01/29/2022   BUN 28 (H) 01/29/2022   GFR 40.43 (L) 10/17/2021   GFRNONAA 53 (L) 01/29/2022   GFRAA >60 09/02/2020   NA 138 01/29/2022   K 3.4 (L) 01/29/2022   CALCIUM 8.7 (L) 01/29/2022   CO2 32 01/29/2022   GLUCOSE 96 01/29/2022  Lab Results  Component Value Date/Time   HGBA1C 5.2 01/19/2022 04:43 AM   HGBA1C 5.8 (H) 10/05/2017 03:50 AM   GFR 40.43 (L) 10/17/2021 02:48 PM   GFR 57.33 (L) 08/29/2021 09:31 AM    Last diabetic Eye exam: No results found for: HMDIABEYEEXA  Last diabetic Foot exam: No results found for: HMDIABFOOTEX   Lab Results  Component Value Date   CHOL 132 01/19/2022   HDL 54 01/19/2022   LDLCALC 63  01/19/2022   TRIG 77 01/19/2022   CHOLHDL 2.4 01/19/2022    Hepatic Function Latest Ref Rng & Units 01/18/2022 10/06/2021 10/05/2021  Total Protein 6.5 - 8.1 g/dL 6.1(L) 6.0(L) 6.6  Albumin 3.5 - 5.0 g/dL 3.2(L) 2.8(L) 3.0(L)  AST 15 - 41 U/L 14(L) 16 23  ALT 0 - 44 U/L 6 10 14   Alk Phosphatase 38 - 126 U/L 108 98 109  Total Bilirubin 0.3 - 1.2 mg/dL 0.3 0.6 0.8  Bilirubin, Direct 0.0 - 0.2 mg/dL - - -    Lab Results  Component Value Date/Time   TSH 2.46 08/29/2021 09:31 AM   TSH 4.591 (H) 08/10/2020 05:49 AM   TSH 1.89 12/26/2018 12:00 AM   FREET4 1.01 08/29/2021 09:31 AM    CBC Latest Ref Rng & Units 01/26/2022 01/24/2022 01/23/2022  WBC 4.0 - 10.5 K/uL 10.0 9.8 9.9  Hemoglobin 12.0 - 15.0 g/dL 9.9(L) 10.1(L) 10.2(L)  Hematocrit 36.0 - 46.0 % 31.9(L) 32.4(L) 33.1(L)  Platelets 150 - 400 K/uL 245 254 250   Iron/TIBC/Ferritin/ %Sat    Component Value Date/Time   IRON 57 12/19/2021 1336   TIBC 300 12/19/2021 1336   FERRITIN 187 12/19/2021 1336   IRONPCTSAT 19 12/19/2021 1336    Lab Results  Component Value Date/Time   VD25OH 38.85 08/29/2021 09:31 AM    Clinical ASCVD: Yes  The ASCVD Risk score (Arnett DK, et al., 2019) failed to calculate for the following reasons:   The patient has a prior MI or stroke diagnosis    Depression screen [facility] 2/9 10/17/2021 08/28/2021 11/12/2018  Decreased Interest 0 1 0  Down, Depressed, Hopeless 0 1 0  PHQ - 2 Score 0 2 0  Altered sleeping - 1 -  Tired, decreased energy - 1 -  Change in appetite - 1 -  Feeling bad or failure about yourself  - 1 -  Trouble concentrating - 1 -  Moving slowly or fidgety/restless - 1 -  Suicidal thoughts - 0 -  PHQ-9 Score - 8 -  Difficult doing work/chores - Somewhat difficult -  Some recent data might be hidden     Social History   Tobacco Use  Smoking Status Former   Packs/day: 1.00   Years: 15.00   Pack years: 15.00   Types: Cigarettes   Quit date: 09/12/2017   Years since quitting: 4.4   Smokeless Tobacco Never   BP Readings from Last 3 Encounters:  01/30/22 129/61  01/18/22 131/76  12/30/21 110/70   Pulse Readings from Last 3 Encounters:  01/30/22 70  01/18/22 73  12/30/21 78   Wt Readings from Last 3 Encounters:  01/18/22 237 lb 3.4 oz (107.6 kg)  12/21/21 226 lb 3.2 oz (102.6 kg)  12/05/21 236 lb (107 kg)   BMI Readings from Last 3 Encounters:  01/18/22 31.30 kg/m  12/21/21 29.84 kg/m  12/05/21 31.14 kg/m    Assessment/Interventions: Review of patient past medical history, allergies, medications, health status, including review of consultants reports, laboratory and other test  data, was performed as part of comprehensive evaluation and provision of chronic care management services.   SDOH:  (Social Determinants of Health) assessments and interventions performed: Yes  SDOH Screenings   Alcohol Screen: Not on file  Depression (PHQ2-9): Low Risk    PHQ-2 Score: 0  Financial Resource Strain: Not on file  Food Insecurity: Not on file  Housing: Not on file  Physical Activity: Not on file  Social Connections: Not on file  Stress: Not on file  Tobacco Use: Medium Risk   Smoking Tobacco Use: Former   Smokeless Tobacco Use: Never   Passive Exposure: Not on file  Transportation Needs: Not on file    CCM Care Plan  Allergies  Allergen Reactions   Meperidine Hives and Anaphylaxis   Strawberry Extract Hives and Swelling   Sucralfate Nausea Only and Other (See Comments)    Severe nausea Other reaction(s): Other (See Comments) Severe nausea Severe nausea   Wasp Venom Hives    Medications Reviewed Today     Reviewed by Arby Barrette, CPhT (Pharmacy Technician) on 01/18/22 at 1643  Med List Status: Complete   Medication Order Taking? Sig Documenting Provider Last Dose Status Informant  albuterol (VENTOLIN HFA) 108 (90 Base) MCG/ACT inhaler [id number] No Inhale 1-2 puffs into the lungs every 6 (six) hours as needed for wheezing or shortness  of breath.  Patient not taking: Reported on 01/18/2022   Arturo Morton Not Taking Active Self  alendronate (FOSAMAX) 70 MG tablet [id number] No Take 1 tablet (70 mg total) by mouth every 7 (seven) days. Take with a full glass of water on an empty stomach.  Patient not taking: Reported on 01/18/2022   [name], MD Not Taking Active            Med Note Malena Catholic Oct 18, 2021  1:37 PM)    aspirin-acetaminophen-caffeine [facility] MIGRAINE) [phone] MG tablet [id number] No Take 1-2 tablets by mouth every 6 (six) hours as needed for headache. [provider] prn unknown Active Self  atorvastatin (LIPITOR) 40 MG tablet [id number] Yes Take 1 tablet (40 mg total) by mouth daily. [name], MD 01/18/2022 0530 Active            Med Note Charlton Haws   Wed Oct 18, 2021  1:37 PM)    carbidopa-levodopa (SINEMET IR) 25-100 MG tablet [id number] Yes Take 1 tablet by mouth 3 (three) times daily. [provider] 01/18/2022 0530 Active   carvedilol (COREG) 12.5 MG tablet [id number] Yes Take 12.5 mg by mouth 2 (two) times daily. [provider] 01/18/2022 0530 Active   cetirizine (ZYRTEC ALLERGY) 10 MG tablet [id number] No Take 1 tablet (10 mg total) by mouth daily.  Patient not taking: Reported on 01/18/2022   Jaynee Eagles, PA-C Not Taking Active   FLUoxetine (PROZAC) 40 MG capsule [id number] Yes Take 40 mg by mouth daily. [provider] 01/18/2022 0530 Active Self           Med Note Lenord Fellers, Cleaster Corin   Wed Oct 18, 2021  1:37 PM)    fluticasone-salmeterol (ADVAIR [facility]) 115-21 MCG/ACT inhaler [id number] No Inhale 2 puffs into the lungs 2 (two) times daily. [name], MD prn unknown Active   furosemide (LASIX) 40 MG tablet [id number] Yes Take 1 tablet (40 mg total) by mouth daily. Sidney Ace, MD 01/18/2022 0530 Active   gabapentin (NEURONTIN) 400 MG capsule [id number] Yes Take 400 mg by  mouth 3 (three) times daily as needed (for nerve pain).  [provider] 01/18/2022 0530 Active Self           Med Note Lenord Fellers, Cleaster Corin   Wed Oct 18, 2021  1:37 PM)    hydrOXYzine (ATARAX/VISTARIL) 25 MG tablet [id number] Yes Take 25 mg by mouth every 8 (eight) hours as needed for anxiety. [provider] 01/18/2022 0530 Active Self           Med Note Lenord Fellers, Cleaster Corin   Wed Oct 18, 2021  1:37 PM)    ibuprofen (ADVIL) 600 MG tablet [id number] No Take 1 tablet (600 mg total) by mouth every 6 (six) hours as needed.  Patient not taking: Reported on 01/18/2022   Melynda Ripple, MD Not Taking Active   KLOR-CON M20 20 MEQ tablet [id number] Yes TAKE 1 TABLET BY MOUTH EVERY DAY [name], MD 01/18/2022 0530 Active   linaclotide (LINZESS) 145 MCG CAPS capsule [id number] No Take 145 mcg by mouth daily before breakfast.  Patient not taking: Reported on 11/03/2021   [provider] Not Taking Active   meloxicam (MOBIC) 7.5 MG tablet [id number] No Take 7.5 mg by mouth daily.  Patient not taking: Reported on 01/18/2022   [provider] Not Taking Active            Med Note Malena Catholic Oct 18, 2021  1:37 PM)    methocarbamol (ROBAXIN) 500 MG tablet [id number] No Take 1 tablet (500 mg total) by mouth 2 (two) times daily as needed for muscle spasms.  Patient not taking: Reported on 01/18/2022   Teodora Medici, FNP Not Taking Active   mirtazapine (REMERON) 15 MG tablet [id number] Yes Take 15 mg by mouth at bedtime. [provider] 01/17/2022 2000 Active Self           Med Note Lenord Fellers, Cleaster Corin   Wed Oct 18, 2021  1:37 PM)    nitrofurantoin, macrocrystal-monohydrate, (MACROBID) 100 MG capsule [id number] No Take 1 capsule (100 mg total) by mouth 2 (two) times daily.  Patient not taking: Reported on 01/18/2022   Francene Finders, PA-C Not Taking Active   omeprazole (PRILOSEC) 40 MG capsule [id number] Yes TAKE 1 CAPSULE BY MOUTH EVERY DAY  Patient taking differently: Take 40 mg by mouth daily.   [name], MD 01/18/2022 0530 Active            Med Note Charlton Haws   Wed Oct 18, 2021  1:37 PM)    ondansetron (ZOFRAN-ODT) 4 MG disintegrating tablet [id number] Yes Take 1-2 tablets by mouth every 6 hours as needed Levin Erp, [place] 01/18/2022 0530 Active Self  OXYGEN [id number]  Inhale 3 L/min into the lungs See admin instructions. 3 L/min at bedtime and as needed for shortness of breath throughout the day [provider]  Active Self  polyethylene glycol-electrolytes (NULYTELY) 420 g solution [id number] No Take 4,000 mLs by mouth as directed.  Patient not taking: Reported on 01/18/2022   [provider] Not Taking Active            Med Note Malena Catholic Oct 18, 2021  1:37 PM)    predniSONE (DELTASONE) 20 MG tablet [id number] No Take 2 tablets daily with breakfast.  Patient not taking: Reported on 01/18/2022   Jaynee Eagles, PA-C Not Taking Active   valsartan (DIOVAN) 320 MG tablet [id number] Yes Take 320 mg by mouth daily.  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   Acute ischemic stroke ([place]) 01/18/2022   Hypotension 10/27/2021   Tremor 10/27/2021   Microcytic anemia 09/11/2021   Stage 3a chronic kidney disease ([place]) 09/11/2021   IDA (iron deficiency anemia) 09/11/2021   Elevated TSH 08/28/2021   Osteopenia 08/28/2021   DOE (dyspnea on exertion) 08/28/2021   Acute midline thoracic back pain 06/26/2021   Other chronic pain 06/26/2021   Chest pressure 02/20/2021   Left sided abdominal pain 02/01/2021   Chronic diastolic CHF (congestive heart failure) ([place]) 08/09/2020   COPD (chronic obstructive pulmonary disease) ([place]) 08/09/2020   Chronic respiratory failure with hypoxia ([place]) 08/09/2020   Polyp of sigmoid colon    Polyp of cecum    Gastritis and gastroduodenitis     Pylorus ulcer    Pyloric stenosis in adult    Chronic cholecystitis 05/19/2020   Oxygen dependent 03/30/2020   Nausea and vomiting in adult 03/22/2020   Peptic ulcer disease 03/22/2020   Iron deficiency anemia 03/22/2020   Acute pain of right knee 12/29/2019   Acute left-sided low back pain without sciatica 12/29/2019   Chronic abdominal pain 02/19/2019   Asymptomatic bacteriuria 02/19/2019   CKD (chronic kidney disease) stage 3, GFR 30-59 ml/min ([place]) 01/13/2019   CHF (congestive heart failure) ([place]) 01/13/2019   Dizziness 01/13/2019   Hoarseness 11/12/2018   Dysphagia 11/12/2018   Sleepiness 11/12/2018   CVA (cerebral vascular accident) ([place]) 08/22/2018   Left-sided weakness 10/04/2017   HLD (hyperlipidemia) 10/04/2017   GERD (gastroesophageal reflux disease) 10/04/2017   Anxiety 08/26/2017   Essential hypertension    Depression    Diarrhea 02/22/2015   Hypokalemia 02/22/2015   Chronic lower back pain 04/29/2013   Paresthesia 04/29/2013    Immunization History  Administered Date(s) Administered   Fluad Quad(high Dose 65+) 09/30/2019, 08/29/2020, 08/28/2021   Influenza, High Dose Seasonal PF 08/23/2018   Influenza,inj,Quad PF,6+ Mos 02/24/2015   Moderna Sars-Covid-2 Vaccination 03/18/2020, 04/15/2020   Pneumococcal Conjugate-13 11/12/2018   Pneumococcal Polysaccharide-23 02/24/2015    Conditions to be addressed/monitored:  Hypertension, Hyperlipidemia, Heart Failure, GERD, COPD, Chronic Kidney Disease, Depression, Anxiety, and Osteoporosis, hx CVA  There are no care plans that you recently modified to display for this patient.    Medication Assistance: None required.  Patient affirms current coverage meets needs.  Compliance/Adherence/Medication fill history: Care Gaps: Mammogram (07/16/19)  Star-Rating Drugs: Valsartan - LF11/27/22 x 90 ds; PDC 99% Atorvastatin - LF 12/24/21 x 90 ds; [place] 65%  Patient's preferred pharmacy is:  [id number] [place] [address]Phone: [phone]: [address]Phone: [phone]: [phone]  Uses pill box? No - prefers bottles Pt endorses 100% compliance  We discussed: Current pharmacy is preferred with insurance plan and patient is satisfied with pharmacy services Patient decided to: Continue current medication management strategy  Care Plan and Follow Up Patient Decision:  Patient agrees to Care Plan and Follow-up.  Plan: Telephone follow up appointment with care management team member scheduled for:  3 month  Charlene Brooke, PharmD, BCACP Clinical Pharmacist [facility] [phone]   Current Barriers:  Unable to independently monitor therapeutic efficacy Suboptimal therapeutic regimen for HTN/heart failure and HLD  Pharmacist Clinical Goal(s):  Patient will achieve adherence to monitoring guidelines and medication adherence to achieve therapeutic efficacy adhere to plan to optimize therapeutic regimen for HTN/HF and HLD as evidenced by report of adherence to recommended medication management changes through collaboration with PharmD and provider.   Interventions: 1:1 collaboration with [name], MD regarding development and update of comprehensive plan of care as evidenced by provider attestation and co-signature Inter-disciplinary care team collaboration (see longitudinal plan of care) Comprehensive medication review performed; medication list updated in electronic medical record  Hyperlipidemia: (LDL goal < 70) -Not ideally controlled - pt was previously off of statin therapy for close to a year (pt forgot to refill); she has restarted atorvastatin and aspirin Oct 2022 -Hx CVA -Current treatment:  Atorvastatin 40 mg daily Aspirin 81 mg daily ? -Current exercise  habits: none - pain and SOB -Educated on Cholesterol goals; Benefits of statin for ASCVD risk reduction; given history of CVA it is recommended she take a high-intensity statin and aspirin -Recommend to continue current medication, repeat lipid panel with next PCP/cardiology visit  Heart Failure / Hypertension (Goal: BP < 140/90 and prevent exacerbations) -Improving - Pt reports compliance with medications as below; she takes furosemide and potassium together before breakfast; she reports she is drinking fluids regularly and denies s/sx of dehydration -Current home BP/HR readings: not checking -Last ejection fraction: 55-65% (Date: 08/23/2018) -HF type: Diastolic -Current treatment: Valsartan 320 mg daily - stopped in hospital Carvedilol 6.25 mg BID  Furosemide 40 mg daily - stopped in hospital Potassium chloride 20 meq daily w/ lasix - stopped in hospital -Medications previously tried: amlodipine, carvedilol, spironolactone, telmisartan, HCTZ, isosorbide -Educated on Benefits of medications for managing symptoms and prolonging life; Proper diuretic administration and potassium supplementation; Importance of blood pressure control -Counseled on limitations of office BP monitoring alone, benefits of home BP monitoring -Advised to check BP at home daily -Recommend to continue current medication  COPD (Goal: control symptoms and prevent exacerbations) -Not ideally controlled- pt was precribed Advair yesterday and has not started it yet; she reports she gets "jittery" with albuterol and wondered if that would happen with Advair as well -Current treatment  Albuterol HFA - stopped in hospital Advair HFA (fluticasone-salmeterol) 115-21 mcg/act 2 puff BID Cetirizine 10 mg daily Supplemental O2 (noctural) -Gold Grade: unknown (no PFTs on file) -Pulmonary function testing: not on file -Exacerbations requiring treatment in last 6 months: 1? -Frequency of rescue inhaler use: daily -Advised trial of  Advair BID - if not tolerated, consider LAMA monotherapy (Spiriva, Incruse)  Depression/Anxiety (Goal: manage symtoms) -Controlled - pt reports mood is under control, current doses are fine -PHQ9: 8 (08/2021) - mild depression -GAD7: not on file -Current treatment: Fluoxetine 40 mg - 1 cap daily Hydroxyzine 25 mg q8h PRN - takes 1 daily Mirtazapine 15 mg HS Quetiapine 25 mg HS - new -Medications previously tried/failed: Abilify, duloxetine, hydroxyzine -Recommended to continue current medication  Possible Parkinson's Disease (Goal: manage symptoms) -{US controlled/uncontrolled:25276} -Current treatment  Carbidopa/levodopa IR 25-100 mg 1/2 tab TID  -Medications previously tried: ***  -{CCMPHARMDINTERVENTION:25122}  Hemiplegic migraine (Goal: manage symptoms) -{US controlled/uncontrolled:25276} -Current treatment  Divalproex 500 mg q12h - new Sumatriptan 50 mg PRN - new Tylenol PRN Excedrin (ASA-caffeine-APAP) PRN -Medications previously tried: ***  -{CCMPHARMDINTERVENTION:25122}  Osteopenia (Goal prevent fractures) -Not ideally  controlled - pt has been prescribed alendronate but she not started it yet due to GI issues -Last DEXA Scan: 08/10/21  T-Score total hip: -2.0  T-Score lumbar spine: -1.6  10-year probability of major osteoporotic fracture: 17.6%  10-year probability of hip fracture: 3.0% -Patient is a candidate for pharmacologic treatment due to history of vertebral fracture and T-Score -1.0 to -2.5 and 10-year risk of hip fracture > 3% -Current treatment  Alendronate 70 mg weekly (prescribed 08/2021, not started yet) - stopped in hospital -Recommend [phone] units of vitamin D daily. Recommend 1200 mg of calcium daily from dietary and supplemental sources. Counseled on oral bisphosphonate administration: take in the morning, 30 minutes prior to food with 6-8 oz of water. Do not lie down for at least 30 minutes after taking.  Back pain (Goal: manage  pain) -Controlled -hx multiple falls, T10 compression fracture s/p kyphoplasty 07/20/21 -Current treatment: Gabapentin 400 mg TID prn Meloxicam 7.5 mg daily - stopped in hospital Ibuprofen 600 mg PRN - stopped in hospital -Recommended to continue current medication  GERD / dysphagia / PUD (Goal: manage symptoms) -Controlled -hx pyloric stenosis. Follows w/ GI. -Current treatment  Omeprazole 40 mg daily - 2 daily Ondansetron 4 mg PRN -Chronic PPI use is not ideal given bone density issues; however pt has severe symptoms if she does not take PPI -Recommended to continue current medication  Chronic constipation (Goal: manage symptoms) -Controlled - pt is not taking Linzess consistently (she was using samples);  -Current treatment  Linzess 145 mcg daily PRN - not taking -Recommended to continue current medication; advised to contact GI for additional samples if needed  Health Maintenance -Vaccine gaps: TDAP, Shingrix, covid booster -Current therapy:  Excedrine Migraine (ASA-APAP-caffeine) -Patient is satisfied with current therapy and denies issues -Recommended to continue current medication  Patient Goals/Self-Care Activities Patient will:  - take medications as prescribed focus on medication adherence by pill box check blood pressure daily -Start Advair twice a day; contact PCP or PharmD if jitteriness occurs

## 2022-02-17 IMAGING — DX DG CHEST 2V
2 series · 2 of 2 positions shown · non-contrast
Comparison: Chest radiographs 12/21/2019 and earlier. Chest CT
08/22/2018.

CT Abdomen and Pelvis 10/26/2020.

CLINICAL DATA: 69-year-old female former smoker. Nonproductive
cough. Shortness of breath. Chest pain for 2 days.

EXAM:
CHEST - 2 VIEW

[chest pa]
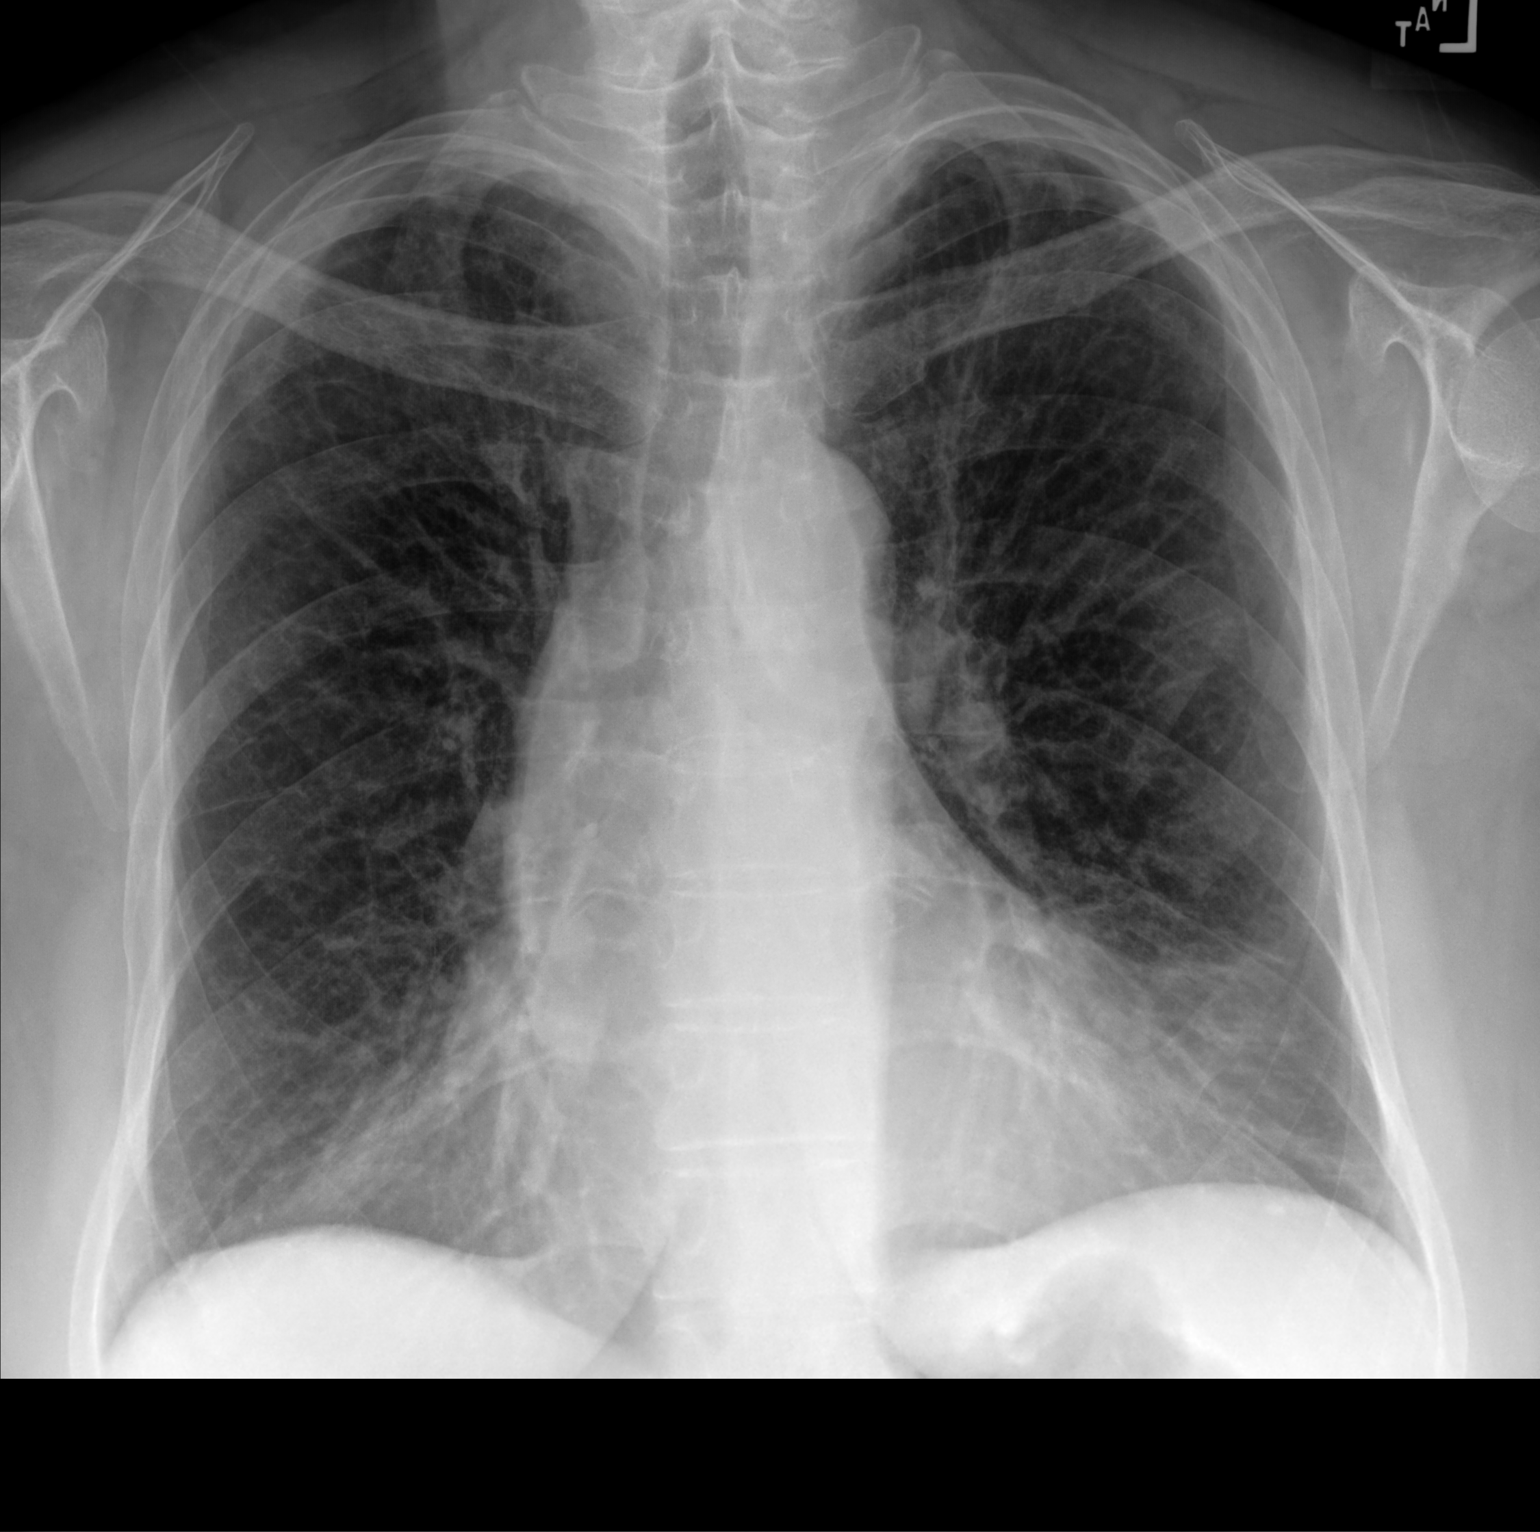

[chest lat]
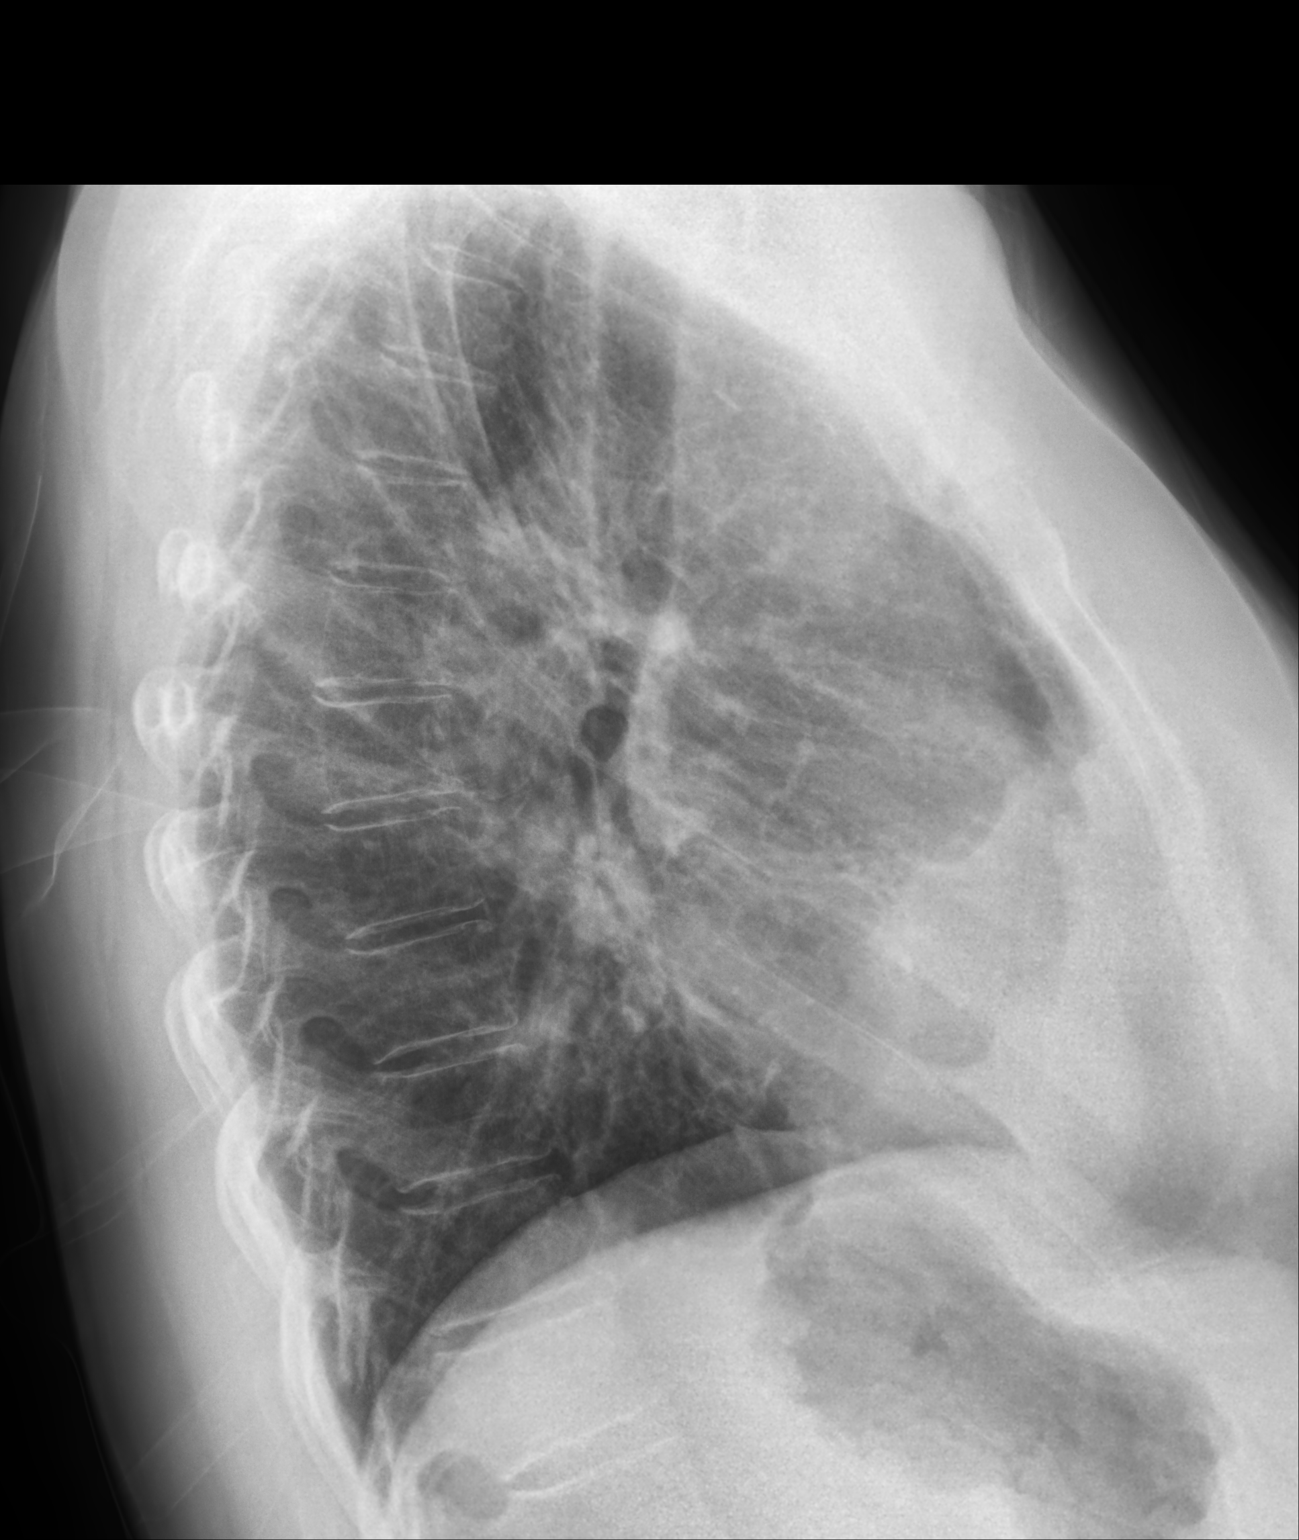

[2 of 2 positions shown; findings below may reference images not displayed]

FINDINGS: Stable lung volumes and mediastinal contours. Patchy opacity in the
lingula has resolved since [REDACTED]. Stable mild apical pleural
and/or lung scarring. No pneumothorax, pulmonary edema, pleural
effusion or acute pulmonary opacity.

No acute osseous abnormality identified. Negative visible bowel gas
pattern. Visualized tracheal air column is within normal limits.
IMPRESSION: No acute cardiopulmonary abnormality.

## 2022-02-19 DIAGNOSIS — R569 Unspecified convulsions: Secondary | ICD-10-CM | POA: Diagnosis not present

## 2022-02-19 DIAGNOSIS — G43119 Migraine with aura, intractable, without status migrainosus: Secondary | ICD-10-CM | POA: Diagnosis not present

## 2022-02-19 DIAGNOSIS — E538 Deficiency of other specified B group vitamins: Secondary | ICD-10-CM | POA: Diagnosis not present

## 2022-02-19 DIAGNOSIS — Z8673 Personal history of transient ischemic attack (TIA), and cerebral infarction without residual deficits: Secondary | ICD-10-CM | POA: Diagnosis not present

## 2022-02-19 DIAGNOSIS — G43419 Hemiplegic migraine, intractable, without status migrainosus: Secondary | ICD-10-CM | POA: Diagnosis not present

## 2022-02-19 DIAGNOSIS — G20 Parkinson's disease: Secondary | ICD-10-CM | POA: Diagnosis not present

## 2022-02-20 ENCOUNTER — Other Ambulatory Visit: Payer: Self-pay

## 2022-02-20 ENCOUNTER — Telehealth: Payer: Self-pay | Admitting: Family Medicine

## 2022-02-20 ENCOUNTER — Ambulatory Visit (INDEPENDENT_AMBULATORY_CARE_PROVIDER_SITE_OTHER): Payer: Medicare HMO | Admitting: Family Medicine

## 2022-02-20 VITALS — BP 100/60 | HR 62 | Temp 98.0°F | Ht 72.0 in | Wt 239.2 lb

## 2022-02-20 DIAGNOSIS — E538 Deficiency of other specified B group vitamins: Secondary | ICD-10-CM

## 2022-02-20 DIAGNOSIS — E559 Vitamin D deficiency, unspecified: Secondary | ICD-10-CM | POA: Diagnosis not present

## 2022-02-20 DIAGNOSIS — G43419 Hemiplegic migraine, intractable, without status migrainosus: Secondary | ICD-10-CM

## 2022-02-20 DIAGNOSIS — D509 Iron deficiency anemia, unspecified: Secondary | ICD-10-CM | POA: Diagnosis not present

## 2022-02-20 DIAGNOSIS — G20 Parkinson's disease: Secondary | ICD-10-CM | POA: Diagnosis not present

## 2022-02-20 DIAGNOSIS — G20A1 Parkinson's disease without dyskinesia, without mention of fluctuations: Secondary | ICD-10-CM | POA: Insufficient documentation

## 2022-02-20 MED ORDER — CYANOCOBALAMIN 1000 MCG/ML IJ SOLN
1000.0000 ug | Freq: Once | INTRAMUSCULAR | Status: AC
  Administered 2022-02-20: 1000 ug via INTRAMUSCULAR

## 2022-02-20 MED ORDER — CHOLECALCIFEROL 1.25 MG (50000 UT) PO TABS: 1.0000 | ORAL_TABLET | ORAL | 1 refills | Status: DC

## 2022-02-20 NOTE — Progress Notes (Signed)
? ?Subjective:  ? ?  ?Morgan [name] is a 71 y.o. female presenting for Hospitalization Follow-up ?  ? ? ?HPI ? ?#Hemiplegic migraine ?- just released from rehab on Saturday ?- has [facility] and they are working with this ?- ambulating with cane ?- had a neurology appt yesterday  ?- started on depakote and seroquel while in the hospital  ?- headaches daily ?- taking excedrin migraine daily ?- sumatriptan - not taking it that often  ? ?#parkinsons ?- on sinemet ?- is noticing improvement in her shaking with this medication ? ?#low energy ?- when she will get this back ?- has noticed some slow improvement ?- doing ok at home ?- feeling tired all the time ?-  ? ?Review of Systems ? ?2/2-2/14/2023: Admission - suspected stroke, ultimately hemiplegic migraine as MRI negative. Depakote and sumatriptan prn. Seroquel for sleep ? ?Social History  ? ?Tobacco Use  ?Smoking Status Former  ? Packs/day: 1.00  ? Years: 15.00  ? Pack years: 15.00  ? Types: Cigarettes  ? Quit date: 09/12/2017  ? Years since quitting: 4.4  ?Smokeless Tobacco Never  ? ? ? ?   ?Objective:  ?  ?BP Readings from Last 3 Encounters:  ?02/20/22 100/60  ?01/30/22 129/61  ?01/18/22 131/76  ? ?Wt Readings from Last 3 Encounters:  ?02/20/22 239 lb 3 oz (108.5 kg)  ?01/18/22 237 lb 3.4 oz (107.6 kg)  ?12/21/21 226 lb 3.2 oz (102.6 kg)  ? ? ?BP 100/60   Pulse 62   Temp 98 ?F (36.7 ?C) (Oral)   Ht 6' (1.829 m)   Wt 239 lb 3 oz (108.5 kg)   SpO2 95%   BMI 32.44 kg/m?  ? ? ?Physical Exam ?Constitutional:   ?   General: She is not in acute distress. ?   Appearance: She is well-developed. She is not diaphoretic.  ?HENT:  ?   Right Ear: External ear normal.  ?   Left Ear: External ear normal.  ?Eyes:  ?   Conjunctiva/sclera: Conjunctivae normal.  ?Cardiovascular:  ?   Rate and Rhythm: Normal rate and regular rhythm.  ?   Heart sounds: Murmur heard.  ?Pulmonary:  ?   Effort: Pulmonary effort is normal. No respiratory distress.  ?   Breath sounds: Normal breath sounds.  No wheezing.  ?Musculoskeletal:  ?   Cervical back: Neck supple.  ?Skin: ?   General: Skin is warm and dry.  ?   Capillary Refill: Capillary refill takes less than 2 seconds.  ?Neurological:  ?   Mental Status: She is alert. Mental status is at baseline.  ?Psychiatric:     ?   Mood and Affect: Mood normal.     ?   Behavior: Behavior normal.  ? ? ? ? ? ?   ?Assessment & Plan:  ? ?Problem List Items Addressed This Visit   ? ?  ? Cardiovascular and Mediastinum  ? Hemiplegic migraine  ?  Following with Dr. Manuella Ghazi with [name], plan is to start [place].  She continues to take Depakote 500 twice daily and minimizing sumatriptan 50 mg as needed. ?  ?  ?  ? Nervous and Auditory  ? Parkinson's disease ([place])  ?  Following with neurology, she notes improvement in symptoms on Sinemet.  Appreciate neurology support. ?  ?  ?  ? Other  ? Iron deficiency anemia  ?  She was previously getting IV iron, iron levels not checked on outside labs.  Advised follow-up with  Dr. Tasia Catchings.  Will not start oral supplement at this time due to history of constipation and severe abdominal pain.  Appreciate hematology support ?  ?  ? Vitamin B12 deficiency  ?  Low vitamin B12 on outside labs, injection of vitamin B12 given today.  Advised starting daily oral supplement.  She will get monthly injections for 3 months total and then we will recheck.  Return in 3 months ?  ?  ? Vitamin D deficiency - Primary  ?  Reviewed outside labs with new diagnosis of vitamin D deficiency.  Start high-dose supplement followed by daily supplement. ?  ?  ? Relevant Medications  ? Cholecalciferol 1.25 MG (50000 UT) TABS  ? ? ? ?Return in about 3 months (around 05/23/2022) for Vit b12 follow-up. ? ?[name], MD ? ?This visit occurred during the SARS-CoV-2 public health emergency.  Safety protocols were in place, including screening questions prior to the visit, additional usage of staff PPE, and extensive cleaning of exam room while observing appropriate contact  time as indicated for disinfecting solutions.  ? ?

## 2022-02-20 NOTE — Assessment & Plan Note (Signed)
Following with neurology, she notes improvement in symptoms on Sinemet.  Appreciate neurology support. ?

## 2022-02-20 NOTE — Telephone Encounter (Signed)
Pt called she forgot to tell Dr. Einar Pheasant that she wanted to have a DNR filled out today  ?

## 2022-02-20 NOTE — Assessment & Plan Note (Addendum)
Low vitamin B12 on outside labs, injection of vitamin B12 given today.  Advised starting daily oral supplement.  She will get monthly injections for 3 months total and then we will recheck.  Return in 3 months ?

## 2022-02-20 NOTE — Assessment & Plan Note (Signed)
Reviewed outside labs with new diagnosis of vitamin D deficiency.  Start high-dose supplement followed by daily supplement. ?

## 2022-02-20 NOTE — Assessment & Plan Note (Signed)
She was previously getting IV iron, iron levels not checked on outside labs.  Advised follow-up with Dr. Tasia Catchings.  Will not start oral supplement at this time due to history of constipation and severe abdominal pain.  Appreciate hematology support ?

## 2022-02-20 NOTE — Telephone Encounter (Signed)
DNR filled out and placed on PCP's desk for signature.  ?

## 2022-02-20 NOTE — Patient Instructions (Addendum)
Vitamin D ?Your vitamin D level was low.  Going to send in a high-dose replacement that I would like you to take for 8 weeks.  After you finish this you should start a supplement of 800 to 1000 units daily. ? ?Vitamin B12 ?- injections - monthly for 3 months ?- repeat labs ?- start oral Vitamin B12 1000 mcg daily ? ? ?Anemia ?- call Dr. Tasia Catchings for follow-up plan ?- Hemoglobin 9.2 ? ? ? ?

## 2022-02-20 NOTE — Assessment & Plan Note (Signed)
Following with Dr. Manuella Ghazi with [name], plan is to start [place].  She continues to take Depakote 500 twice daily and minimizing sumatriptan 50 mg as needed. ?

## 2022-02-21 DIAGNOSIS — D649 Anemia, unspecified: Secondary | ICD-10-CM | POA: Diagnosis not present

## 2022-02-21 DIAGNOSIS — R69 Illness, unspecified: Secondary | ICD-10-CM | POA: Diagnosis not present

## 2022-02-21 DIAGNOSIS — G20 Parkinson's disease: Secondary | ICD-10-CM | POA: Diagnosis not present

## 2022-02-21 DIAGNOSIS — I10 Essential (primary) hypertension: Secondary | ICD-10-CM | POA: Diagnosis not present

## 2022-02-21 DIAGNOSIS — M549 Dorsalgia, unspecified: Secondary | ICD-10-CM | POA: Diagnosis not present

## 2022-02-21 DIAGNOSIS — N1832 Chronic kidney disease, stage 3b: Secondary | ICD-10-CM | POA: Diagnosis not present

## 2022-02-21 DIAGNOSIS — K219 Gastro-esophageal reflux disease without esophagitis: Secondary | ICD-10-CM | POA: Diagnosis not present

## 2022-02-21 DIAGNOSIS — I509 Heart failure, unspecified: Secondary | ICD-10-CM | POA: Diagnosis not present

## 2022-02-21 DIAGNOSIS — E785 Hyperlipidemia, unspecified: Secondary | ICD-10-CM | POA: Diagnosis not present

## 2022-02-21 DIAGNOSIS — G43409 Hemiplegic migraine, not intractable, without status migrainosus: Secondary | ICD-10-CM | POA: Insufficient documentation

## 2022-02-21 DIAGNOSIS — R609 Edema, unspecified: Secondary | ICD-10-CM | POA: Diagnosis not present

## 2022-02-21 NOTE — Telephone Encounter (Signed)
DNR signed and placed up front for pick up. Pt notified.  ?

## 2022-02-23 ENCOUNTER — Encounter: Payer: Self-pay | Admitting: Oncology

## 2022-02-27 ENCOUNTER — Encounter (INDEPENDENT_AMBULATORY_CARE_PROVIDER_SITE_OTHER): Payer: Self-pay | Admitting: Nurse Practitioner

## 2022-02-27 ENCOUNTER — Ambulatory Visit (INDEPENDENT_AMBULATORY_CARE_PROVIDER_SITE_OTHER): Payer: Medicare HMO | Admitting: Nurse Practitioner

## 2022-02-27 ENCOUNTER — Other Ambulatory Visit: Payer: Self-pay

## 2022-02-27 VITALS — BP 94/60 | HR 62 | Resp 14 | Ht 73.0 in | Wt 226.0 lb

## 2022-02-27 DIAGNOSIS — I739 Peripheral vascular disease, unspecified: Secondary | ICD-10-CM

## 2022-02-27 DIAGNOSIS — I6523 Occlusion and stenosis of bilateral carotid arteries: Secondary | ICD-10-CM

## 2022-03-02 ENCOUNTER — Telehealth: Payer: Self-pay | Admitting: Family Medicine

## 2022-03-02 NOTE — Telephone Encounter (Signed)
Tammy with [facility] called stating that pt missed PT visit due to multiple doctors appt. ?

## 2022-03-02 NOTE — Telephone Encounter (Signed)
noted 

## 2022-03-07 ENCOUNTER — Encounter (INDEPENDENT_AMBULATORY_CARE_PROVIDER_SITE_OTHER): Payer: Self-pay | Admitting: Nurse Practitioner

## 2022-03-07 NOTE — Progress Notes (Signed)
? ?Subjective:  ? ? Patient ID: Morgan [name], female    DOB: August 23, 1951, 71 y.o.   MRN: [id number] ?Chief Complaint  ?Patient presents with  ? New Patient (Initial Visit)  ?  Carotid stenosis  ? ? ?Morgan [name] is a 71 year old female that presents today for evaluation of carotid artery stenosis by Dr. Manuella Ghazi.  The patient was recently at [facility] emergency room and there was concern that she was having strokelike symptoms.  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  She is mostly resolved from this.  During that time the patient had a CT scan which showedPlaque at the right ICA origin causes approximately 60% stenosis. Plaque at the left ICA origin causes approximately 50% stenosis.  However, my review shows greater then 65% stenosis noted.  She also describes some claudication-like symptoms when she ambulates. ? ? ?Review of Systems  ?All other systems reviewed and are negative. ? ?   ?Objective:  ? Physical Exam ?Vitals reviewed.  ?Neck:  ?   Vascular: No carotid bruit.  ?Cardiovascular:  ?   Rate and Rhythm: Normal rate.  ?   Pulses: Normal pulses.  ?Pulmonary:  ?   Effort: Pulmonary effort is normal.  ?Skin: ?   General: Skin is warm and dry.  ?Neurological:  ?   Mental Status: She is alert and oriented to person, place, and time.  ?Psychiatric:     ?   Mood and Affect: Mood normal.     ?   Behavior: Behavior normal.     ?   Thought Content: Thought content normal.     ?   Judgment: Judgment normal.  ? ? ?BP 94/60 (BP Location: Left Arm)   Pulse 62   Resp 14   Ht '6\' 1"'$  (1.854 m)   Wt 226 lb (102.5 kg)   BMI 29.82 kg/m?  ? ?Past Medical History:  ?Diagnosis Date  ? Allergy   ? Anemia   ? Anxiety   ? Arthritis   ? CHF (congestive heart failure) ([place])   ? Depression   ? GERD (gastroesophageal reflux disease)   ? Hypertension   ? MVP (mitral valve prolapse)   ? Parkinson disease ([place])   ? ? ?Social History  ? ?Socioeconomic History  ? Marital status: Single  ?  Spouse name:  Not on file  ? Number of children: 2  ? Years of education: Not on file  ? Highest education level: Not on file  ?Occupational History  ? Not on file  ?Tobacco Use  ? Smoking status: Former  ?  Packs/day: 1.00  ?  Years: 15.00  ?  Pack years: 15.00  ?  Types: Cigarettes  ?  Quit date: 09/12/2017  ?  Years since quitting: 4.4  ? Smokeless tobacco: Never  ?Vaping Use  ? Vaping Use: Never used  ?Substance and Sexual Activity  ? Alcohol use: Not Currently  ? Drug use: No  ? Sexual activity: Not Currently  ?Other Topics Concern  ? Not on file  ?Social History Narrative  ? Lives with Grandson's great grandparents   ? 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 Daughter - [name]  ? Support system - family, aunt, [name] and [name] (who she lives with), brother  ? Transportation: roommates  ? Enjoy: [employer], playing with grandson  ? Exercise: PT exercises  ? Diet: good - chicken, some steak, tries to eat healthy, avoids  fried foods  ? Retired from being a CNA  ? ?Social Determinants of Health  ? ?Financial Resource Strain: Not on file  ?Food Insecurity: Not on file  ?Transportation Needs: Not on file  ?Physical Activity: Not on file  ?Stress: Not on file  ?Social Connections: Not on file  ?Intimate Partner Violence: Not on file  ? ? ?Past Surgical History:  ?Procedure Laterality Date  ? ABDOMINAL HYSTERECTOMY    ? APPENDECTOMY    ? BALLOON DILATION N/A 04/21/2020  ? Procedure: BALLOON DILATION;  Surgeon: Milus Banister, MD;  Location: [facility];  Service: Endoscopy;  Laterality: N/A;  pyloric/ GI  ? BALLOON DILATION N/A 07/04/2020  ? Procedure: BALLOON DILATION;  Surgeon: Mauri Pole, MD;  Location: [facility];  Service: Endoscopy;  Laterality: N/A;  ? BIOPSY  04/21/2020  ? Procedure: BIOPSY;  Surgeon: Milus Banister, MD;  Location: [facility];  Service: Endoscopy;;  ? BIOPSY  07/04/2020  ? Procedure: BIOPSY;  Surgeon: Mauri Pole, MD;  Location: [facility];  Service: Endoscopy;;   EGD and COLON  ? BREAST SURGERY Right 1988  ? tumor removal  ? CHOLECYSTECTOMY N/A 05/18/2020  ? Procedure: LAPAROSCOPIC CHOLECYSTECTOMY;  Surgeon: Johnathan Hausen, MD;  Location: [facility];  Service: General;  Laterality: N/A;  ? COLONOSCOPY WITH PROPOFOL N/A 07/04/2020  ? Procedure: COLONOSCOPY WITH PROPOFOL;  Surgeon: Mauri Pole, MD;  Location: [facility];  Service: Endoscopy;  Laterality: N/A;  ? COLONOSCOPY WITH PROPOFOL N/A 11/03/2021  ? Procedure: COLONOSCOPY WITH PROPOFOL;  Surgeon: Lesly Rubenstein, MD;  Location: [facility];  Service: Endoscopy;  Laterality: N/A;  ? ESOPHAGEAL DILATION  08/11/2020  ? Procedure: PYLORIC DILATION;  Surgeon: Doran Stabler, MD;  Location: [facility];  Service: Gastroenterology;;  ? ESOPHAGOGASTRODUODENOSCOPY (EGD) WITH PROPOFOL N/A 04/21/2020  ? Procedure: ESOPHAGOGASTRODUODENOSCOPY (EGD) WITH PROPOFOL;  Surgeon: Milus Banister, MD;  Location: [facility];  Service: Endoscopy;  Laterality: N/A;  ? ESOPHAGOGASTRODUODENOSCOPY (EGD) WITH PROPOFOL N/A 07/04/2020  ? Procedure: ESOPHAGOGASTRODUODENOSCOPY (EGD) WITH PROPOFOL;  Surgeon: Mauri Pole, MD;  Location: [facility];  Service: Endoscopy;  Laterality: N/A;  ? ESOPHAGOGASTRODUODENOSCOPY (EGD) WITH PROPOFOL N/A 08/11/2020  ? Procedure: ESOPHAGOGASTRODUODENOSCOPY (EGD) WITH PROPOFOL;  Surgeon: Doran Stabler, MD;  Location: [facility];  Service: Gastroenterology;  Laterality: N/A;  ? ESOPHAGOGASTRODUODENOSCOPY (EGD) WITH PROPOFOL N/A 11/03/2021  ? Procedure: ESOPHAGOGASTRODUODENOSCOPY (EGD) WITH PROPOFOL;  Surgeon: Lesly Rubenstein, MD;  Location: [facility] END[facility];  Service: Endoscopy;  Laterality: N/A;  ? KNEE SURGERY  2005  ? 2 times left  ? KNEE SURGERY Right   ? KYPHOPLASTY N/A 07/20/2021  ? Procedure: T 10KYPHOPLASTY;  Surgeon: Hessie Knows, MD;  Location: [facility];  Service: Orthopedics;  Laterality: N/A;  ? KYPHOPLASTY N/A 08/23/2021  ? Procedure: T9 KYPHOPLASTY;  Surgeon: Hessie Knows, MD;   Location: [facility] OR[facility];  Service: Orthopedics;  Laterality: N/A;  ? POLYPECTOMY  07/04/2020  ? Procedure: POLYPECTOMY;  Surgeon: Mauri Pole, MD;  Location: [facility];  Service: Endoscopy;;  ? TUBAL LIGATION    ? ? ?Family History  ?Problem Relation Age of Onset  ? Breast cancer Mother 35  ? Ovarian cancer Mother 12  ? Brain cancer Mother   ? Hypertension Father   ? Hyperlipidemia Father   ? Heart disease Father   ? Colon cancer Father   ?     diagnosed in his 34s  ? Breast cancer Maternal Grandmother 22  ? Diabetes Paternal Grandmother   ?  Breast cancer Paternal Grandmother 52  ? Other Son   ?     drug over dose  ? Colon cancer Maternal Grandfather   ?     dx in his 13s  ? Liver disease Maternal Grandfather   ? Breast cancer Maternal Aunt 60  ? Breast cancer Maternal Aunt 60  ? Colon cancer Maternal Uncle   ? Stomach cancer Neg Hx   ? Pancreatic cancer Neg Hx   ? Esophageal cancer Neg Hx   ? ? ?Allergies  ?Allergen Reactions  ? Meperidine Hives and Anaphylaxis  ? Strawberry Extract Hives and Swelling  ? Sucralfate Nausea Only and Other (See Comments)  ?  Severe nausea ?Other reaction(s): Other (See Comments) ?Severe nausea ?Severe nausea  ? Wasp Venom Hives  ? ? ?CBC Latest Ref Rng & Units 01/26/2022 01/24/2022 01/23/2022  ?WBC 4.0 - 10.5 K/uL 10.0 9.8 9.9  ?Hemoglobin 12.0 - 15.0 g/dL 9.9(L) 10.1(L) 10.2(L)  ?Hematocrit 36.0 - 46.0 % 31.9(L) 32.4(L) 33.1(L)  ?Platelets 150 - 400 K/uL 245 254 250  ? ? ? ? ?CMP  ?   ?Component Value Date/Time  ? NA 138 01/29/2022 0444  ? NA 143 12/26/2018 0000  ? NA 139 09/18/2012 2000  ? K 3.4 (L) 01/29/2022 0444  ? K 3.3 (L) 09/18/2012 2000  ? CL 98 01/29/2022 0444  ? CL 99 09/18/2012 2000  ? CO2 32 01/29/2022 0444  ? CO2 33 (H) 09/18/2012 2000  ? GLUCOSE 96 01/29/2022 0444  ? GLUCOSE 111 (H) 09/18/2012 2000  ? BUN 28 (H) 01/29/2022 0444  ? BUN 16 12/26/2018 0000  ? BUN 13 09/18/2012 2000  ? CREATININE 1.13 (H) 01/29/2022 0444  ? CREATININE 0.72 09/18/2012 2000  ? CALCIUM 8.7 (L)  01/29/2022 0444  ? CALCIUM 9.4 09/18/2012 2000  ? PROT 6.1 (L) 01/18/2022 1137  ? PROT 7.3 09/18/2012 2000  ? ALBUMIN 3.2 (L) 01/18/2022 1137  ? ALBUMIN 2.8 (L) 09/18/2012 2000  ? AST 14 (L) 01/18/2022 11

## 2022-03-14 ENCOUNTER — Encounter: Payer: Self-pay | Admitting: Family Medicine

## 2022-03-14 ENCOUNTER — Ambulatory Visit (INDEPENDENT_AMBULATORY_CARE_PROVIDER_SITE_OTHER): Payer: Medicare HMO | Admitting: Family Medicine

## 2022-03-14 ENCOUNTER — Ambulatory Visit (INDEPENDENT_AMBULATORY_CARE_PROVIDER_SITE_OTHER)
Admission: RE | Admit: 2022-03-14 | Discharge: 2022-03-14 | Disposition: A | Discharge status: Home / Self Care | Payer: Medicare HMO | Source: Ambulatory Visit | Attending: Family Medicine | Admitting: Family Medicine

## 2022-03-14 VITALS — BP 130/80 | HR 86 | Temp 98.4°F | Ht 73.0 in | Wt 211.2 lb

## 2022-03-14 DIAGNOSIS — J449 Chronic obstructive pulmonary disease, unspecified: Secondary | ICD-10-CM

## 2022-03-14 DIAGNOSIS — R112 Nausea with vomiting, unspecified: Secondary | ICD-10-CM

## 2022-03-14 DIAGNOSIS — I5032 Chronic diastolic (congestive) heart failure: Secondary | ICD-10-CM | POA: Diagnosis not present

## 2022-03-14 DIAGNOSIS — R052 Subacute cough: Secondary | ICD-10-CM

## 2022-03-14 DIAGNOSIS — R0602 Shortness of breath: Secondary | ICD-10-CM | POA: Diagnosis not present

## 2022-03-14 DIAGNOSIS — R059 Cough, unspecified: Secondary | ICD-10-CM | POA: Diagnosis not present

## 2022-03-14 DIAGNOSIS — J441 Chronic obstructive pulmonary disease with (acute) exacerbation: Secondary | ICD-10-CM | POA: Diagnosis not present

## 2022-03-14 LAB — POC COVID19 BINAXNOW: SARS Coronavirus 2 Ag: NEGATIVE

## 2022-03-14 MED ORDER — AMOXICILLIN-POT CLAVULANATE 875-125 MG PO TABS: 1.0000 | ORAL_TABLET | Freq: Two times a day (BID) | ORAL | 0 refills | Status: AC

## 2022-03-14 MED ORDER — PREDNISONE 20 MG PO TABS: 40.0000 mg | ORAL_TABLET | Freq: Every day | ORAL | 0 refills | Status: AC

## 2022-03-14 NOTE — Patient Instructions (Addendum)
Likely a COPD exacerbation ?- Antibiotics and steroids ?- Use albuterol every 6 hours for the next 2 days ? ?Update me if no improvement in 2 days ?

## 2022-03-14 NOTE — Assessment & Plan Note (Signed)
Appears euvolemic today. No sign of fluid overload. Cont lasix.  ?

## 2022-03-14 NOTE — Progress Notes (Signed)
? ?Subjective:  ? ?  ?Morgan [name] is a 71 y.o. female presenting for Cough (All symptoms x 3 days. No home covid test done. ) and Nausea ?  ? ? ?Headache  ?Associated symptoms include coughing, dizziness and sinus pressure.  ?Cough ?Associated symptoms include headaches.  ?Dizziness ?Associated symptoms include coughing and headaches.  ? ?#Cough ?- started 3 days ago ?- also having some nausea - no vomiting ?- thought of food makes her sick ?- constant nausea x 3 days ?- also with headache ?- stuffy nose ?- cold/sweats ?- hard to breath - SOB ?- cough is dry ?- no weight gain, no swelling in legs ?- weight down 10 lbs - not eating as much ?- hydration - feels this is good - drank water, tea, juice yesterday ?- also feeling dizzy - will occur when she sits or lays down, not as much with standing ?- dizziness not with turning the head or rolling over that causes this ?- not presyncopal or room spinning ?- "uneasy" feeling ?- no increased wheezing ?- endorses some cp with coughing  ? ?Using advair ?Has albuterol but has not used this ? ?Review of Systems  ?HENT:  Positive for sinus pressure and sinus pain.   ?Eyes:  Negative for itching.  ?Respiratory:  Positive for cough.   ?Neurological:  Positive for dizziness and headaches.  ? ? ?Social History  ? ?Tobacco Use  ?Smoking Status Former  ? Packs/day: 1.00  ? Years: 15.00  ? Pack years: 15.00  ? Types: Cigarettes  ? Quit date: 09/12/2017  ? Years since quitting: 4.5  ?Smokeless Tobacco Never  ? ? ? ?   ?Objective:  ?  ?BP Readings from Last 3 Encounters:  ?03/14/22 130/80  ?02/27/22 94/60  ?02/20/22 100/60  ? ?Wt Readings from Last 3 Encounters:  ?03/14/22 211 lb 3 oz (95.8 kg)  ?02/27/22 226 lb (102.5 kg)  ?02/20/22 239 lb 3 oz (108.5 kg)  ? ? ?BP 130/80   Pulse 86   Temp 98.4 ?F (36.9 ?C) (Oral)   Ht '6\' 1"'$  (1.854 m)   Wt 211 lb 3 oz (95.8 kg)   SpO2 96%   BMI 27.86 kg/m?  ? ? ?Physical Exam ?Constitutional:   ?   Appearance: Normal appearance. She is obese.   ?HENT:  ?   Head: Normocephalic and atraumatic.  ?   Right Ear: Tympanic membrane normal.  ?   Left Ear: Tympanic membrane normal.  ?   Nose: Congestion present.  ?   Mouth/Throat:  ?   Pharynx: Posterior oropharyngeal erythema present.  ?Eyes:  ?   Extraocular Movements: Extraocular movements intact.  ?   Conjunctiva/sclera: Conjunctivae normal.  ?Cardiovascular:  ?   Rate and Rhythm: Normal rate and regular rhythm.  ?   Heart sounds: Murmur heard.  ?Pulmonary:  ?   Effort: Tachypnea present.  ?   Breath sounds: Examination of the right-upper field reveals wheezing. Examination of the left-upper field reveals wheezing. Examination of the right-middle field reveals rhonchi. Examination of the left-middle field reveals rhonchi. Examination of the right-lower field reveals rhonchi. Examination of the left-lower field reveals rhonchi. Wheezing and rhonchi present.  ?Musculoskeletal:  ?   Cervical back: Normal range of motion.  ?   Right lower leg: No edema.  ?   Left lower leg: No edema.  ?Neurological:  ?   Mental Status: She is alert. Mental status is at baseline.  ?Psychiatric:     ?  Mood and Affect: Mood normal.  ? ? ?Rapid Covid Negative ? ?DG Chest 2 View ?CLINICAL DATA:  Cough for 3 days. Shortness of breath. COPD and ?aortic aneurysm. ? ?EXAM: ?CHEST - 2 VIEW ? ?COMPARISON:  10/05/2021 ? ?FINDINGS: ?Osteopenia with vertebral augmentation at 2 lower thoracic levels. ?Patient rotated minimally right. Midline trachea. Normal heart size ?and mediastinal contours. No pleural effusion or pneumothorax. ?Hyperinflation and interstitial thickening. Bilateral ?pleuroparenchymal opacities are greater right than left. Felt to be ?similar to 10/31/2020. No new pulmonary opacity. ? ?IMPRESSION: ?Hyperinflation and interstitial coarsening, consistent with remote ?history of smoking. No superimposed acute process or explanation for ?patient's symptoms. ? ?Electronically Signed ?  By: Abigail Miyamoto M.D. ?  On: 03/14/2022  13:11 ? ? ? ?   ?Assessment & Plan:  ? ?Problem List Items Addressed This Visit   ? ?  ? Cardiovascular and Mediastinum  ? Chronic diastolic CHF (congestive heart failure) ([place]) - Primary  ?  Appears euvolemic today. No sign of fluid overload. Cont lasix.  ?  ?  ? Relevant Orders  ? DG Chest 2 View (Completed)  ?  ? Respiratory  ? COPD exacerbation ([place])  ?  Symptoms seems consistent with COPD exacerbation - wheezing and cough as well as sinus infection. Discussed treating with abx (augmentin) for possible sinus vs complicated COPD excerbation (pt has been using home 02 more often). Will also send in prednisone x 5 day. Discussed call if worsening. CXR w/o pneumonia.  ?  ?  ? Relevant Medications  ? amoxicillin-clavulanate (AUGMENTIN) 875-125 MG tablet  ? predniSONE (DELTASONE) 20 MG tablet  ? COPD (chronic obstructive pulmonary disease) ([place])  ? Relevant Medications  ? predniSONE (DELTASONE) 20 MG tablet  ? Other Relevant Orders  ? DG Chest 2 View (Completed)  ?  ? Digestive  ? Nausea and vomiting in adult  ?  Acute on chronic exacerbation. Discussed continuing her prn zofran. Due to parkinsons she is limited in alternative medication. Update or check in with GI if no improvement with treatment of COPD ?  ?  ? ?Other Visit Diagnoses   ? ? Subacute cough      ? Relevant Orders  ? DG Chest 2 View (Completed)  ? POC COVID-19 BinaxNow (Completed)  ? ?  ? ? ? ?Return if symptoms worsen or fail to improve. ? ?[name], MD ? ?This visit occurred during the SARS-CoV-2 public health emergency.  Safety protocols were in place, including screening questions prior to the visit, additional usage of staff PPE, and extensive cleaning of exam room while observing appropriate contact time as indicated for disinfecting solutions.  ? ?

## 2022-03-14 NOTE — Assessment & Plan Note (Signed)
Symptoms seems consistent with COPD exacerbation - wheezing and cough as well as sinus infection. Discussed treating with abx (augmentin) for possible sinus vs complicated COPD excerbation (pt has been using home 02 more often). Will also send in prednisone x 5 day. Discussed call if worsening. CXR w/o pneumonia.  ?

## 2022-03-14 NOTE — Assessment & Plan Note (Signed)
Acute on chronic exacerbation. Discussed continuing her prn zofran. Due to parkinsons she is limited in alternative medication. Update or check in with GI if no improvement with treatment of COPD ?

## 2022-03-15 ENCOUNTER — Other Ambulatory Visit: Payer: Self-pay | Admitting: Family Medicine

## 2022-03-15 DIAGNOSIS — E559 Vitamin D deficiency, unspecified: Secondary | ICD-10-CM

## 2022-03-17 ENCOUNTER — Other Ambulatory Visit: Payer: Self-pay | Admitting: Physician Assistant

## 2022-03-17 DIAGNOSIS — R110 Nausea: Secondary | ICD-10-CM

## 2022-03-18 IMAGING — RF DG UGI W/ SMALL BOWEL
11 of 18 series · 13 of 24 positions shown · non-contrast
Comparison: CT abdomen pelvis 10/26/2020.

CLINICAL DATA: Left-sided abdominal pain.  Nausea and vomiting.

EXAM:
UPPER GI SERIES WITH SMALL BOWEL FOLLOW-THROUGH
FLUOROSCOPY TIME:  Fluoroscopy Time:  2 minutes 24 seconds
Radiation Exposure Index (if provided by the fluoroscopic device):
Number of Acquired Spot Images: 23
TECHNIQUE: Combined double contrast and single contrast upper GI series using
effervescent crystals, thick barium, and thin barium. Subsequently,
serial images of the small bowel were obtained including spot views
of the terminal ileum.

[Series 1: t abdomen supine · 0.15mm/px · 1 of 1 slices shown]
[im 1/1]
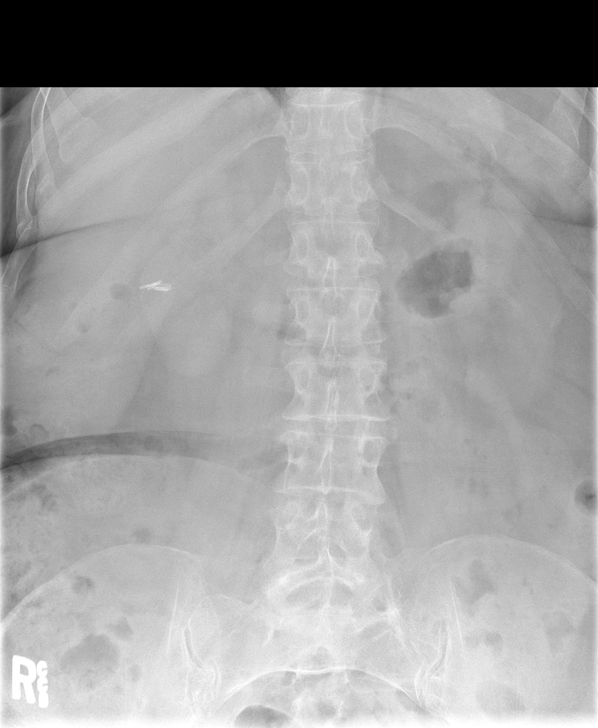

[Series 2: cp_standard · 0.58mm/px · 1 of 95 frames shown (1 of 3)]
[frame 48/95]
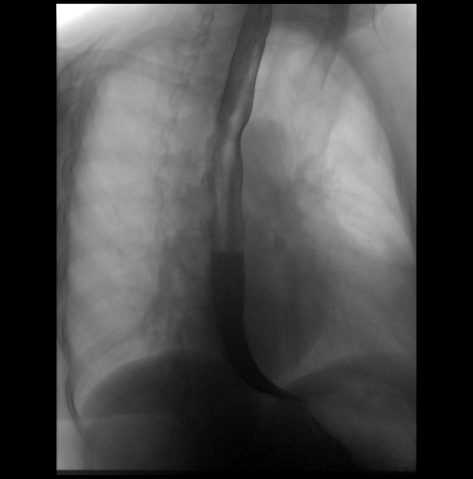

[Series 3: cp_standard · 0.58mm/px · 2 of 41 frames shown (2 of 3)]
[frame 7/41]
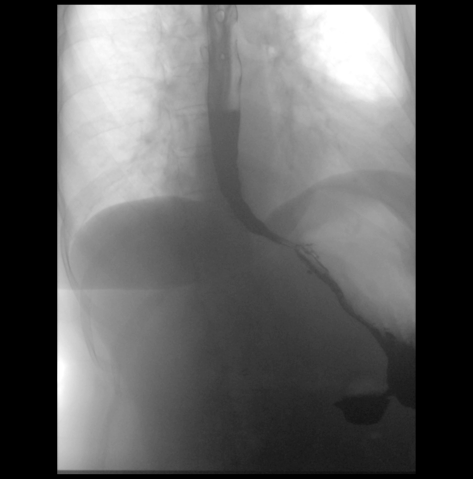
[frame 39/41]
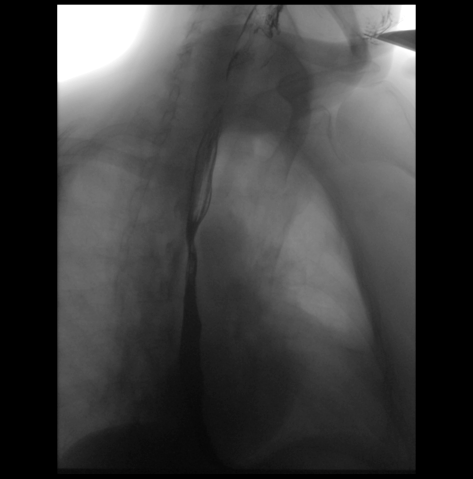

[Series 6: fluoro_barium 2fps_bw · 0.19mm/px · 1 of 1 slices shown (1 of 6)]
[im 1/1]
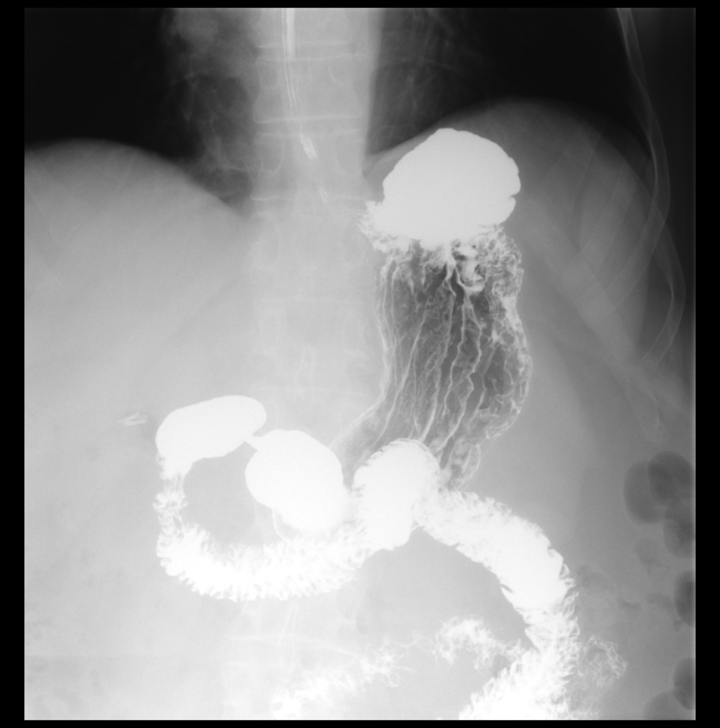

[Series 8: cp_standard · 0.56mm/px · 2 of 74 frames shown (3 of 3)]
[frame 12/74]
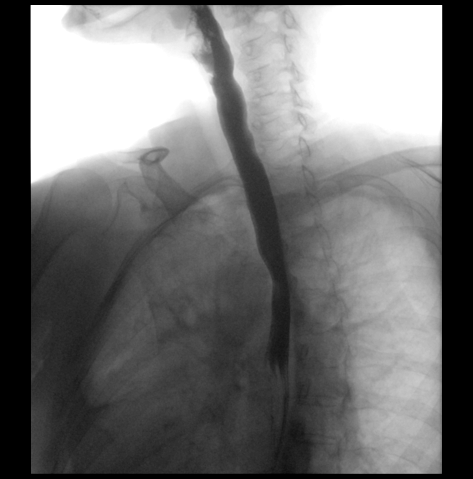
[frame 63/74]
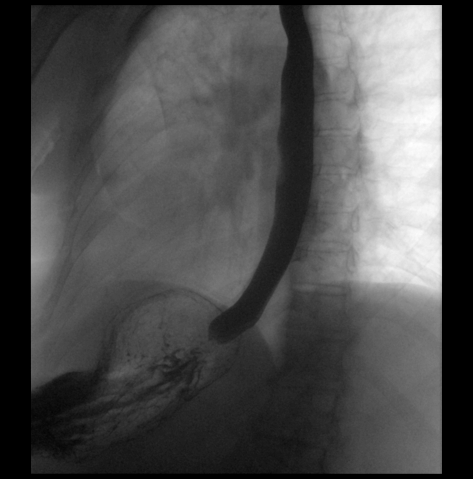

[Series 10: fluoro_barium 2fps_bw · 0.19mm/px · 1 of 1 slices shown (2 of 6)]
[im 1/1]
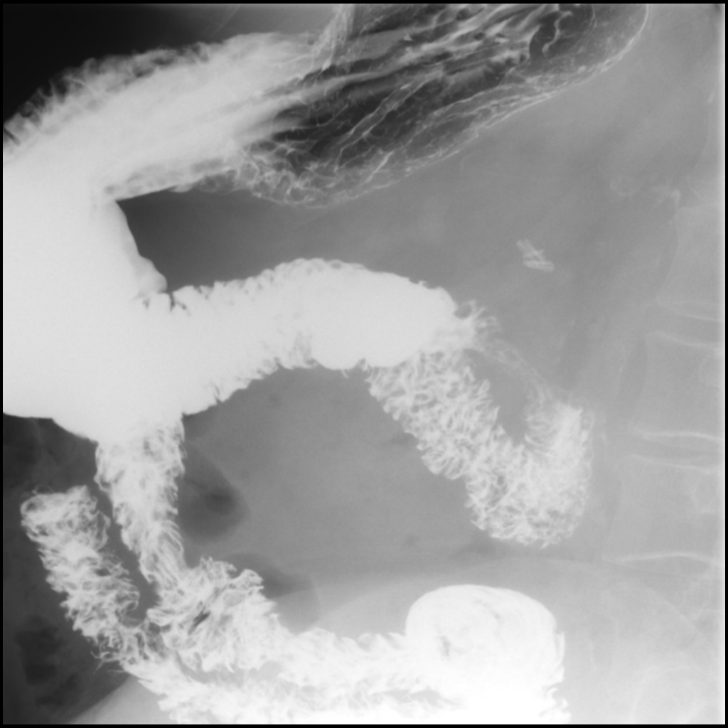

[Series 13: fluoro_barium 2fps_bw · 0.18mm/px · 1 of 1 slices shown (3 of 6)]
[im 1/1]
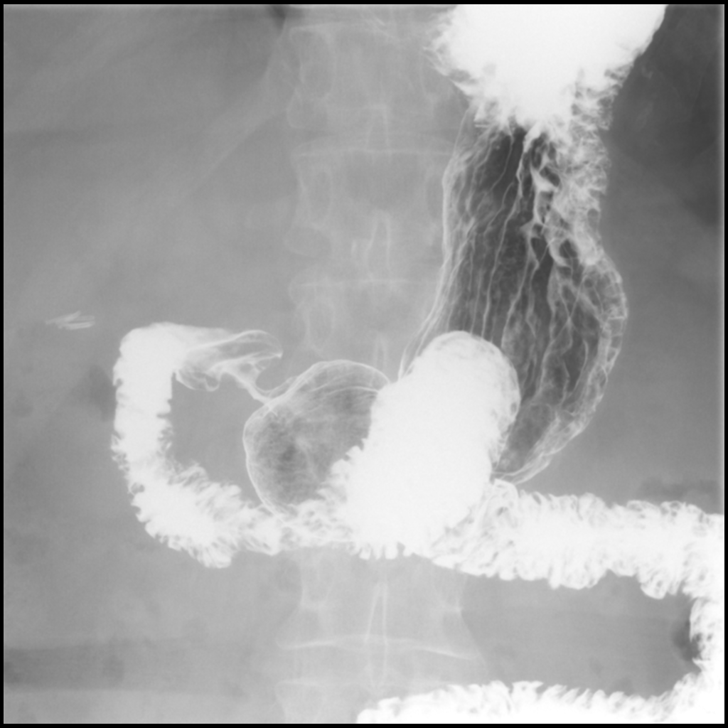

[Series 17: t abdomen barium · 0.15mm/px · 1 of 1 slices shown]
[im 1/1]
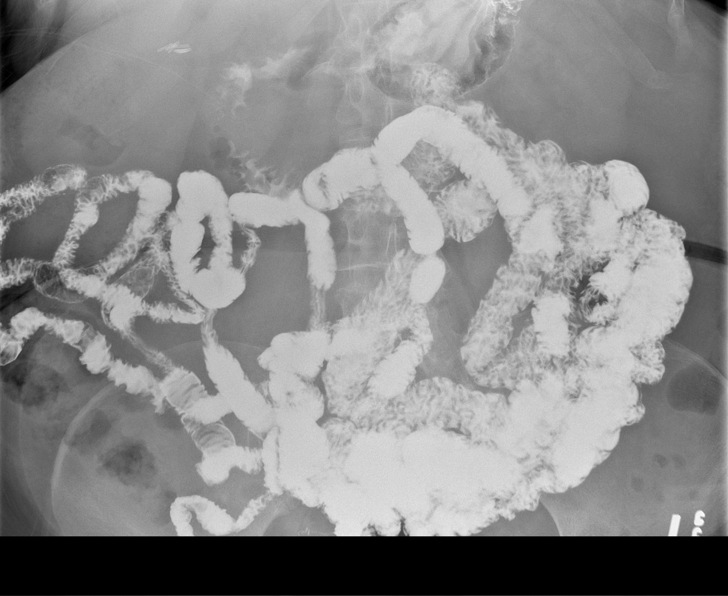

[Series 21: fluoro_barium 2fps_bw · 0.19mm/px · 1 of 2 frames shown (4 of 6)]
[frame 2/2]
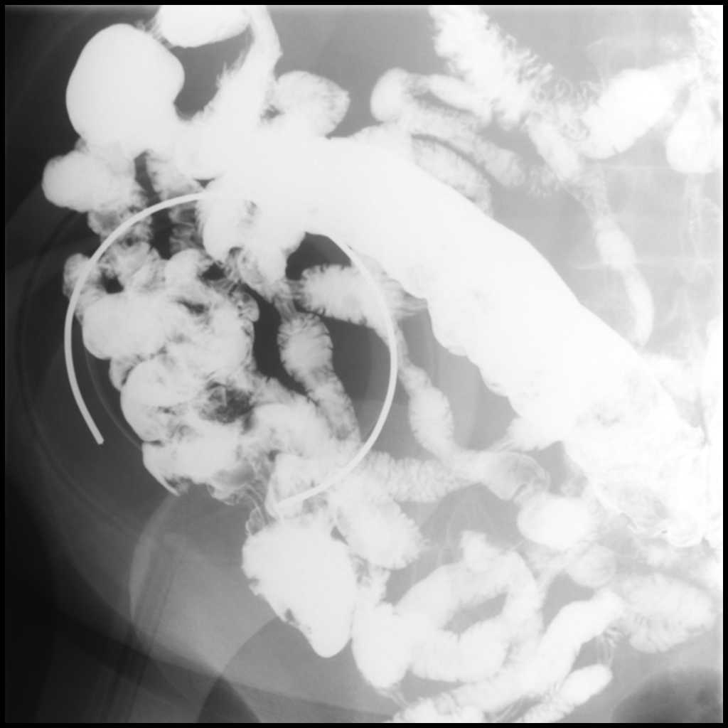

[Series 23: fluoro_barium 2fps_bw · 0.19mm/px · 1 of 1 slices shown (5 of 6)]
[im 1/1]
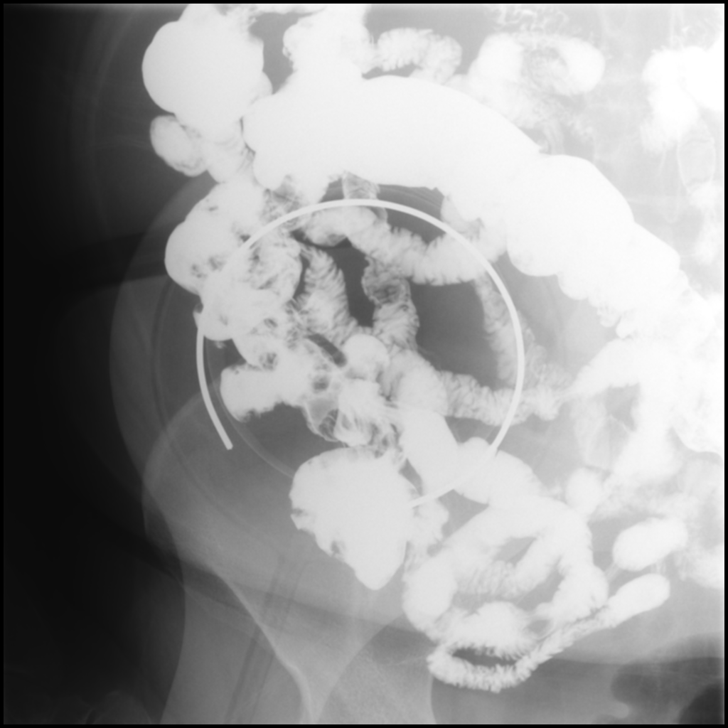

[Series 26: fluoro_barium 2fps_bw · 0.19mm/px · 1 of 1 slices shown (6 of 6)]
[im 1/1]
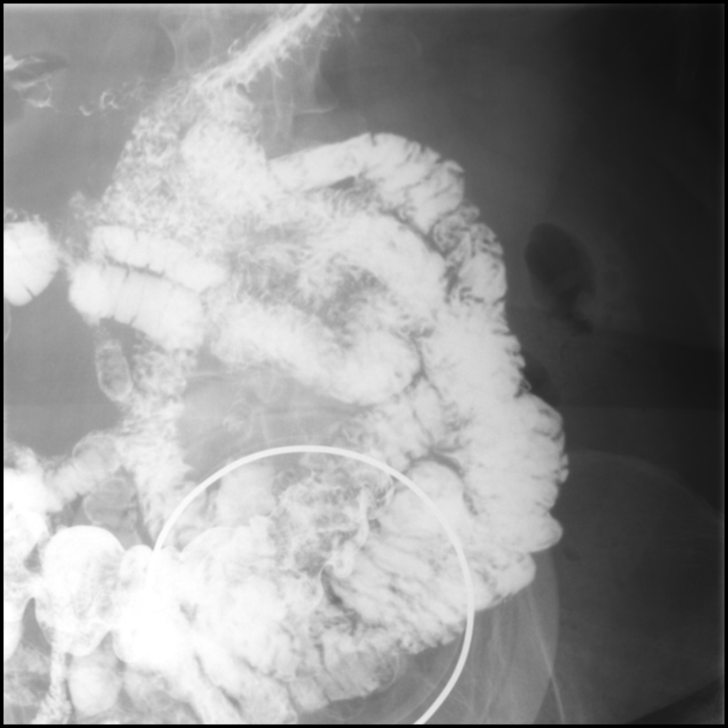

[13 of 24 positions shown; findings below may reference images not displayed]

FINDINGS: Preliminary KUB negative.  Cholecystectomy clips noted.

Esophageal mucosa and motility normal.  Negative for hiatal hernia.

Normal stomach and duodenal bulb. No ulcer or mass. Normal emptying
of the stomach.

Small-bowel follow-through was performed. Small bowel transit time
normal 1 hour. No small bowel dilatation or edema. No mass lesion.
Normal terminal ileum.
IMPRESSION: Negative upper GI with small-bowel follow-through.

## 2022-03-21 ENCOUNTER — Other Ambulatory Visit: Payer: Self-pay | Admitting: Family Medicine

## 2022-03-21 DIAGNOSIS — E782 Mixed hyperlipidemia: Secondary | ICD-10-CM

## 2022-03-22 ENCOUNTER — Ambulatory Visit (INDEPENDENT_AMBULATORY_CARE_PROVIDER_SITE_OTHER): Payer: Medicare HMO

## 2022-03-22 ENCOUNTER — Telehealth: Payer: Self-pay

## 2022-03-22 DIAGNOSIS — E538 Deficiency of other specified B group vitamins: Secondary | ICD-10-CM

## 2022-03-22 MED ORDER — CYANOCOBALAMIN 1000 MCG/ML IJ SOLN
1000.0000 ug | Freq: Once | INTRAMUSCULAR | Status: AC
  Administered 2022-03-22: 1000 ug via INTRAMUSCULAR

## 2022-03-22 NOTE — Progress Notes (Signed)
Per orders of Dr. Duncan, injection of vit B12 given by Ricci Dirocco. Patient tolerated injection well.  

## 2022-03-22 NOTE — Progress Notes (Signed)
? ? ?  Chronic Care Management ?Pharmacy Assistant  ? ?Name: Morgan [name]  MRN: [id number] DOB: Aug 07, 1951 ? ?Reason for Encounter: CCM Counsellor) ?  ?Medications: ?Outpatient Encounter Medications as of 03/22/2022  ?Medication Sig  ? acetaminophen (TYLENOL) 325 MG tablet Take 2 tablets (650 mg total) by mouth every 4 (four) hours as needed for mild pain (or temp > 37.5 C (99.5 F)).  ? AIMOVIG 140 MG/ML SOAJ SMARTSIG:140 Milligram(s) SUB-Q Once a Month  ? aspirin-acetaminophen-caffeine (EXCEDRIN MIGRAINE) 250-250-65 MG tablet Take 1-2 tablets by mouth every 6 (six) hours as needed for headache.  ? atorvastatin (LIPITOR) 40 MG tablet TAKE 1 TABLET BY MOUTH EVERY DAY  ? carbidopa-levodopa (SINEMET IR) 25-100 MG tablet Take 1 tablet by mouth 3 (three) times daily.  ? carvedilol (COREG) 12.5 MG tablet Take 12.5 mg by mouth 2 (two) times daily.  ? Cholecalciferol 1.25 MG (50000 UT) TABS Take 1 tablet by mouth once a week.  ? divalproex (DEPAKOTE) 500 MG DR tablet Take 1 tablet (500 mg total) by mouth every 12 (twelve) hours.  ? Erenumab-aooe 140 MG/ML SOAJ Inject into the skin.  ? FLUoxetine (PROZAC) 40 MG capsule Take 40 mg by mouth daily.  ? fluticasone (FLONASE) 50 MCG/ACT nasal spray Place 1 spray into both nostrils daily.  ? fluticasone-salmeterol (ADVAIR HFA) 115-21 MCG/ACT inhaler Inhale 2 puffs into the lungs 2 (two) times daily.  ? gabapentin (NEURONTIN) 400 MG capsule Take 400 mg by mouth 3 (three) times daily as needed (for nerve pain).  ? hydrOXYzine (ATARAX/VISTARIL) 25 MG tablet Take 25 mg by mouth every 8 (eight) hours as needed for anxiety.  ? mirtazapine (REMERON) 15 MG tablet Take 15 mg by mouth at bedtime.  ? omeprazole (PRILOSEC) 40 MG capsule TAKE 1 CAPSULE BY MOUTH EVERY DAY (Patient taking differently: Take 40 mg by mouth daily.)  ? ondansetron (ZOFRAN-ODT) 4 MG disintegrating tablet TAKE 1 TO 2 TABLETS BY MOUTH EVERY 6 HOURS AS NEEDED**PLEASE CONTACT OFFICE TO SCHEDULED FOLLOW UP  ? OXYGEN  Inhale 3 L/min into the lungs See admin instructions. 3 L/min at bedtime and as needed for shortness of breath throughout the day  ? QUEtiapine (SEROQUEL) 25 MG tablet Take 1 tablet (25 mg total) by mouth at bedtime.  ? SUMAtriptan (IMITREX) 50 MG tablet Take 1 tablet (50 mg total) by mouth 2 (two) times daily as needed for migraine or headache. May repeat in 2 hours if headache persists or recurs.  ? valsartan (DIOVAN) 320 MG tablet Take 320 mg by mouth daily.  ? ?No facility-administered encounter medications on file as of 03/22/2022.  ? ?Morgan [name] was contacted to remind of upcoming telephone visit with Charlene Brooke on 03/27/2022 at 3:00. Patient was reminded to have any blood glucose and blood pressure readings available for review at appointment.  ? ?Message was left reminding patient of appointment. ? ?Star Rating Drugs: ?Medication:  Last Fill: Day Supply ?Atorvastatin 40 mg 03/21/2022 90 ?Valsartan 320 mg 02/16/2022 90 ? ?Charlene Brooke, CPP notified ? ?Marijean Niemann, RMA ?Clinical Pharmacy Assistant ?[phone] ? ?  ? ? ? ?

## 2022-03-27 ENCOUNTER — Telehealth: Payer: Self-pay | Admitting: Pharmacist

## 2022-03-27 ENCOUNTER — Ambulatory Visit: Payer: Medicare HMO

## 2022-03-27 ENCOUNTER — Telehealth: Payer: Medicare HMO

## 2022-03-27 NOTE — Progress Notes (Deleted)
? ?Chronic Care Management ?Pharmacy Note ? ?03/27/2022 ?Name:  Morgan [name] MRN:  [id number] DOB:  07-11-1951 ? ?Summary: ?-Pt reports compliance with furosemide 40 mg and potassium 20 mEq once daily (before breakfast); she reports she is drinking fluids regularly; she denies s/sx of dehydration ?-Pt reports she has restarted atorvastatin, carvedilol and aspirin as advised last CCM visit ?-Pt reports she gets "jittery" when she takes albuterol and asked if Advair would cause this as well; Advair contains a LABA so it can potentially cause a similar side effect ? ?Recommendations/Changes made from today's visit: ?-Recommend repeat lipid panel at next PCP or cardiology visit ?-Advised pt to take Advair twice daily and to contact PCP/PharmD if side effects occur; consider LAMA monotherapy (Spiriva, Incruse) if LABA is not tolerated ? ?Plan: ?-[facility] will call patient *** ?-Pharmacist follow up televisit scheduled for *** ?-PCP f/u due May 2023 (26-month; not yet scheduled ? ? ? ?Subjective: ?Morgan CROTHERSis an 71y.o. year old female who is a primary patient of Cody, JJobe Marker MD.  The CCM team was consulted for assistance with disease management and care coordination needs.   ? ?Engaged with patient by telephone for follow up visit in response to provider referral for pharmacy case management and/or care coordination services.  ? ?Consent to Services:  ?The patient was given information about Chronic Care Management services, agreed to services, and gave verbal consent prior to initiation of services.  Please see initial visit note for detailed documentation.  ? ?Patient Care Team: ?CLesleigh Noe MD as PCP - General (Family Medicine) ?Mackie Holness, LCleaster Corin RCancer Institute Of [facility] Jerseyas Pharmacist (Pharmacist) ?YEarlie Server MD as Consulting Physician (Hematology and Oncology) ?SVladimir Crofts MD as Consulting Physician (Neurology) ? ?Recent office visits: ?03/14/22 Dr CEinar PheasantOV: c/o cough, nausea. Covid negative. Rx'd Augmentin,  prednisone for COPD exacerbation. ? ?02/20/22 Dr CEinar PheasantOV: hospital f/u; IDA - advise hematology f/u, no oral iron d/t hx side effects. ?B12 injection given. Advised oral B12 daily. Start Vit D 50,000 IU weekly. RTC 3 months. ? ?10/17/21 Dr CEinar PheasantOV: hospital f/u; start Advair HFA 115-21 BID ? ?08/28/21 CLesleigh Noe MD- PCP: chronic f/u; CHF exacerbation - increase lasix to 40 mg BID x 5 days, start Kcl 20 meq w/ lasix. C/o pyloric stenosis, advised to contact GI, hold starting alendronate until sx resolve. Low HGB - referred to hematology. ?  ?06/26/21 CLesleigh Noe MD- PCP: acute visit s/p fall, back pain. Fell ~6/27. Rx'd oxycodone for pain. Referral to ortho for severe pain response. ? ?Recent consult visits: ?02/27/22 NP FEulogio Ditch(Vasc surg): new pt - carotid artery stenosis 65%, no intervention until > 75%. Obtain ABI next visit. ? ?02/21/22 Dr KLanora Manis(Nephrology): f/u CKD, anemia. Advised to stop ibuprofen. Fluid restriction 1 L per day. ? ?02/19/22 Dr SManuella Ghazi(Neurology): f/u migraine. Start: Aimovig. Avoid triptans. Advised no driving due to seizure-like episodes. Consider home sleep study (sleep walking, possible OSA) ? ?01/10/22 Dr KLanora Manis(nephrology): new consult CKD. Advised to stop NSAIDs (on ibuoprofen for back pain for years). Fluid restriction 1 L per day. ?  ?12/30/21 Urgent Care: Back pain - pulled a muscle. Pain 8/10. Rx'd methocarbamol. ?  ?12/21/21 Dr YTasia Catchings(oncology): f/u IDA. Rec another dose of Venofer, goal ferritin > 200. No need for ESA (Hgb > 10). Referred to nephrology. ?  ?11/27/2021 - HJennings Books MD - Neurology - Telephone -Pt was having N/V/D with Sinemet 1 tab TID. Side effects resolved with  lower dose 1/2 tab TID. ?  ?11/23/2021 Serafina Royals, MD - Cardiology - Patient presented for four week follow up for congestive heart failure, hypertension and fatigue. No medication changes.  ?  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  Ordered: MRI brain without contrast. Ordered: Sleep deprived EEG. Order: Physical Therapy. Start: Carbidopa/levodopa 25/100 mg - 1 tab three times a day. Recommended patient not drive due to "white-out" spells. ?  ?10/29/2021 - [facility] - UTI. Rx'd Macrobid.  ?10/26/2021 Serafina Royals, MD - Cardiology - Telephone - Patient called she is fatigued and shaky since starting Carvedilol. Change: Take half a dose BID of Carvedilol due to side effects. ?10/25/2021 Serafina Royals, MD - Cardiology - Patient presented for 6 month follow up for dizziness and chest pain. Started isosorbide 30 mg daily and increased carvedilol to 12.5 mg BID. ? ?09/20/21 Urgent Care: sinusitis - rx'd Augmentin, prednisone 20 mg. Low suspicion COPD exacerbation. ?09/12/21 Urgent care: c/o sore throat. Dx viral URI. Covid, strep negative.  Meds: cetirizine 10 mg daily, and prednisone 30 mg daily ?09/11/21 [name], MD (Heme/onc): new pt - eval IDA. RTC 2 weeks, +/- Venofer. (Avoid oral iron d/t chronic constipation) ?09/06/21  Locklear, [name], MD (GI [name]): f/u chronic nausea, dysphagia. Ordered barium swallow, NM gastric emptying study ?07/31/21 [name], MD (ortho [name]): f/u T10 compression fx. ?07/19/21 [name], MD  (ortho [name]): f/u T10 compression fx. Schedule kyphoplasty tomorrow and vertebroplasty soon. ?07/05/21 [name], PA (Ortho [name]): initial visit for compression fracture. ?06/12/21 Urgent Care - Pneumonia, rib pain. Rx'd Augmentin, Doxycycline, ibuprofen, tizanidine. ?05/11/21 Flossie Dibble, MD (cardiology - [name]): f/u HTN, aortic aneurysm. Increase valsartan to 320 mg daily. ?  ?Hospital visits: ?Medication Reconciliation was completed by comparing discharge summary, patient?s EMR and Pharmacy list, and upon discussion with patient. ? 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 Discharge date was 01/30/22.  Discharged from [facility].   ?-received TNK during Code Stroke, admitted to ICU. MRI brain negative for acute stroke. 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 Discharged to SNF. ?-avoid compazine, phenergan, reglan in s/o Parkinson's ?  ?New?Medications Started at [facility] Discharge:?? ?-started Depakote, Quetiapine, sumatriptan due to migraine ?  ?Medications Discontinued at Hospital Discharge: ?-Stopped albuterol, alendronate, cetirizine, furosemide, ibuprofen, klor-con, Linzess, meloxicam, methocarbamol, valsartan ?  ?Medications that remain the same after Hospital Discharge:??  ?-All other medications will remain the same.   ?  ?Admitted to the hospital on 10/05/21 due to Hypokalemia. Discharge date was 10/07/21. Discharged from [facility].   ?-pt was not taking potassium due to not wanting to take it; also had n/v ?-overdiuresis (recently increased lasix to 40 mg BID) ? ?Medication Changes at Hospital Discharge: ?-Changed Lasix to 40 mg daily w/ Kcl 20 mEq daily ? ?Medications that remain the same after Hospital Discharge:??  ?-All other medications will remain the same.   ? ?Admitted to the hospital on 08/23/21 due to T9 Kyphoplasty. Discharge date was 08/23/21. Discharged from [facility].   ?  ?Medications that remain the same after Hospital Discharge:??  ?-All other medications will remain the same. ?  ?Admitted to the hospital on 07/20/21 due to T10 kyphoplasty. Discharge date was 07/20/21. Discharged from [facility].   ? ?Medications Discontinued at Hospital Discharge: ?- abilfy, metamucil, potassium, sucralfate and tizanidine ? ?  Medications that remain the same after Hospital Discharge:??  ?-All other medications will remain the same. ?  ?Admitted to the ED on 07/01/21 due to fall. Discharge date was 07/01/21. Discharged from [facility] ?-Xrays and CT negative for acute abnormalities ? ?Medications that remain the same after  Hospital Discharge:??  ?-All other medications will remain the same. ?  ?Admitted to the ED on 03/26/21 due to Knee sprain. Discharge date was 03/26/21. Discharged from [facility] ED ?  ?M

## 2022-03-27 NOTE — Telephone Encounter (Signed)
?  Chronic Care Management  ? ?Outreach Note ? ?03/27/2022 ?Name: Morgan [name] MRN: [id number] DOB: 1951-05-29 ? ?Referred by: [name], MD ? ?Patient had a phone appointment scheduled with clinical pharmacist today. ? ?An unsuccessful telephone outreach was attempted today. The patient was referred to the pharmacist for assistance with medications, care management and care coordination.  ? ?Patient will NOT be penalized in any way for missing a CCM appointment. The no-show fee does not apply. ? ?If possible, a message was left to return call to: [phone] or to [facility]. ? ?Charlene Brooke, PharmD, BCACP ?Clinical Pharmacist ?[facility] ?[phone] ? ? ?

## 2022-04-03 ENCOUNTER — Telehealth: Payer: Self-pay | Admitting: Family Medicine

## 2022-04-03 DIAGNOSIS — I5032 Chronic diastolic (congestive) heart failure: Secondary | ICD-10-CM

## 2022-04-03 MED ORDER — FUROSEMIDE 40 MG PO TABS: 40.0000 mg | ORAL_TABLET | Freq: Every day | ORAL | 1 refills | Status: DC

## 2022-04-03 NOTE — Telephone Encounter (Signed)
Left VM (DPR) for pt letting her know that a refill has been sent in.  ?

## 2022-04-03 NOTE — Addendum Note (Signed)
Addended by: [name] R on: 04/03/2022 01:49 PM ? ? Modules accepted: Orders ? ?

## 2022-04-03 NOTE — Telephone Encounter (Signed)
I am not sure why it came off of her medication list.  Refill sent to pharmacy. ?

## 2022-04-03 NOTE — Telephone Encounter (Signed)
Pt called stating that she is completely out of medication and not understanding why the medication can't be refilled. Please advise. ?

## 2022-04-12 ENCOUNTER — Ambulatory Visit (INDEPENDENT_AMBULATORY_CARE_PROVIDER_SITE_OTHER): Payer: Medicare HMO | Admitting: Family Medicine

## 2022-04-12 ENCOUNTER — Ambulatory Visit (INDEPENDENT_AMBULATORY_CARE_PROVIDER_SITE_OTHER)
Admission: RE | Admit: 2022-04-12 | Discharge: 2022-04-12 | Disposition: A | Discharge status: Home / Self Care | Payer: Medicare HMO | Source: Ambulatory Visit | Attending: Family Medicine | Admitting: Family Medicine

## 2022-04-12 VITALS — BP 110/50 | HR 66 | Temp 97.5°F | Ht 73.0 in | Wt 212.4 lb

## 2022-04-12 DIAGNOSIS — J441 Chronic obstructive pulmonary disease with (acute) exacerbation: Secondary | ICD-10-CM | POA: Diagnosis not present

## 2022-04-12 DIAGNOSIS — R051 Acute cough: Secondary | ICD-10-CM | POA: Diagnosis not present

## 2022-04-12 DIAGNOSIS — R509 Fever, unspecified: Secondary | ICD-10-CM | POA: Diagnosis not present

## 2022-04-12 DIAGNOSIS — R059 Cough, unspecified: Secondary | ICD-10-CM | POA: Diagnosis not present

## 2022-04-12 LAB — POC COVID19 BINAXNOW: SARS Coronavirus 2 Ag: NEGATIVE

## 2022-04-12 MED ORDER — GUAIFENESIN-CODEINE 100-10 MG/5ML PO SOLN: 5.0000 mL | Freq: Three times a day (TID) | ORAL | 0 refills | Status: DC | PRN

## 2022-04-12 MED ORDER — PREDNISONE 20 MG PO TABS: ORAL_TABLET | ORAL | 0 refills | Status: AC

## 2022-04-12 MED ORDER — LEVOFLOXACIN 500 MG PO TABS
500.0000 mg | ORAL_TABLET | Freq: Every day | ORAL | 0 refills | Status: AC
Start: 2022-04-12 — End: 2022-04-19

## 2022-04-12 NOTE — Patient Instructions (Signed)
Call to make an appointment with pulmonology as soon as able  ?- ideally in 2-3 weeks ? ?If pulmonology cannot see within 2-3 weeks, make an appointment to see me ? ?Steroids ?Antibiotics ?Chest X-ray ?

## 2022-04-12 NOTE — Assessment & Plan Note (Addendum)
Symptoms seem consistent with COPD exacerbation, green sputum shortness of breath and increased oxygen need at home.  She was just recently treated with Augmentin and noted minimal improvement with this.  Given that we will try levofloxacin as well as a longer course of steroids given poor air movement and wheezing on lung exam.  Cough syrup as requested.  Also advised that she see pulmonology or me in 2 to 3 weeks for to assess for recovery.  Chest x-ray appears stable without clear sign of pneumonia, we will follow-up final read ?

## 2022-04-12 NOTE — Progress Notes (Signed)
? ?Subjective:  ? ?  ?Morgan [name] is a 71 y.o. female presenting for Cough (Productive with thick green mucous x 2 days ), Nasal Congestion, Fever (101), Shortness of Breath, and Generalized Body Aches ?  ? ? ?Cough ?This is a new problem. The cough is Productive of sputum and productive of purulent sputum. Associated symptoms include chills, ear pain, a fever, headaches, myalgias, nasal congestion and shortness of breath. Pertinent negatives include no rhinorrhea, sore throat or wheezing. She has tried OTC cough suppressant (advil) for the symptoms.  ?Fever  ?Associated symptoms include abdominal pain, coughing, diarrhea, ear pain, headaches, nausea and vomiting. Pertinent negatives include no sore throat or wheezing.  ?Shortness of Breath ?Associated symptoms include abdominal pain, ear pain, a fever, headaches and vomiting. Pertinent negatives include no rhinorrhea, sore throat or wheezing.  ? ? ?Last saw pulmonology a few months ago ?Using advair daily ?Inhaler twice daily w/o improvement ?No swelling in legs ? ?Has increased home O2 to 4L ? ?03/14/2022 - got Abx and steroids w/ some improvement but no resolution completely ? ? ?Review of Systems  ?Constitutional:  Positive for chills and fever.  ?HENT:  Positive for ear pain. Negative for rhinorrhea and sore throat.   ?Respiratory:  Positive for cough and shortness of breath. Negative for wheezing.   ?Gastrointestinal:  Positive for abdominal pain, diarrhea, nausea and vomiting.  ?Musculoskeletal:  Positive for myalgias.  ?Neurological:  Positive for headaches.  ? ? ?Social History  ? ?Tobacco Use  ?Smoking Status Former  ? Packs/day: 1.00  ? Years: 15.00  ? Pack years: 15.00  ? Types: Cigarettes  ? Quit date: 09/12/2017  ? Years since quitting: 4.5  ?Smokeless Tobacco Never  ? ? ? ?   ?Objective:  ?  ?BP Readings from Last 3 Encounters:  ?04/12/22 (!) 110/50  ?03/14/22 130/80  ?02/27/22 94/60  ? ?Wt Readings from Last 3 Encounters:  ?04/12/22 212 lb 7 oz  (96.4 kg)  ?03/14/22 211 lb 3 oz (95.8 kg)  ?02/27/22 226 lb (102.5 kg)  ? ? ?BP (!) 110/50   Pulse 66   Temp (!) 97.5 ?F (36.4 ?C) (Oral)   Ht '6\' 1"'$  (1.854 m)   Wt 212 lb 7 oz (96.4 kg)   SpO2 96%   BMI 28.03 kg/m?  ? ? ?Physical Exam ?Constitutional:   ?   General: She is not in acute distress. ?   Appearance: She is well-developed. She is not diaphoretic.  ?HENT:  ?   Head: Normocephalic and atraumatic.  ?   Right Ear: External ear normal.  ?   Left Ear: External ear normal.  ?   Nose:  ?   Right Sinus: Maxillary sinus tenderness present.  ?   Left Sinus: Maxillary sinus tenderness present.  ?Eyes:  ?   Conjunctiva/sclera: Conjunctivae normal.  ?Cardiovascular:  ?   Rate and Rhythm: Normal rate and regular rhythm.  ?   Heart sounds: Murmur heard.  ?Pulmonary:  ?   Effort: Pulmonary effort is normal. Tachypnea present.  ?   Breath sounds: Decreased breath sounds and wheezing present. No rhonchi or rales.  ?   Comments: Poor airmovement ?Musculoskeletal:  ?   Cervical back: Neck supple.  ?   Right lower leg: No edema.  ?   Left lower leg: No edema.  ?Skin: ?   General: Skin is warm and dry.  ?   Capillary Refill: Capillary refill takes less than 2 seconds.  ?Neurological:  ?  Mental Status: She is alert. Mental status is at baseline.  ?Psychiatric:     ?   Mood and Affect: Mood normal.     ?   Behavior: Behavior normal.  ? ? ?CXR (my read): kyphoplasty present, hyperinflation, some airway thickening which appears stable from last month ? ? ?   ?Assessment & Plan:  ? ?Problem List Items Addressed This Visit   ? ?  ? Respiratory  ? COPD with acute exacerbation ([place]) - Primary  ?  Symptoms seem consistent with COPD exacerbation, green sputum shortness of breath and increased oxygen need at home.  She was just recently treated with Augmentin and noted minimal improvement with this.  Given that we will try levofloxacin as well as a longer course of steroids given poor air movement and wheezing on lung exam.   Cough syrup as requested.  Also advised that she see pulmonology or me in 2 to 3 weeks for to assess for recovery.  Chest x-ray appears stable without clear sign of pneumonia, we will follow-up final read ? ?  ?  ? Relevant Medications  ? predniSONE (DELTASONE) 20 MG tablet  ? levofloxacin (LEVAQUIN) 500 MG tablet  ? guaiFENesin-codeine 100-10 MG/5ML syrup  ? Other Relevant Orders  ? DG Chest 2 View  ? ?Other Visit Diagnoses   ? ? Acute cough      ? Relevant Medications  ? guaiFENesin-codeine 100-10 MG/5ML syrup  ? Other Relevant Orders  ? POC COVID-19 (Completed)  ? ?  ? ? ? ?Return if symptoms worsen or fail to improve. ? ?[name], MD ? ? ? ?

## 2022-04-13 ENCOUNTER — Ambulatory Visit: Payer: Medicare HMO | Admitting: Pulmonary Disease

## 2022-04-13 ENCOUNTER — Encounter: Payer: Self-pay | Admitting: Pulmonary Disease

## 2022-04-13 VITALS — BP 124/66 | HR 69 | Temp 98.0°F | Ht 73.0 in | Wt 214.6 lb

## 2022-04-13 DIAGNOSIS — R053 Chronic cough: Secondary | ICD-10-CM | POA: Diagnosis not present

## 2022-04-13 DIAGNOSIS — J449 Chronic obstructive pulmonary disease, unspecified: Secondary | ICD-10-CM

## 2022-04-13 MED ORDER — MONTELUKAST SODIUM 10 MG PO TABS: 10.0000 mg | ORAL_TABLET | Freq: Every day | ORAL | 11 refills | Status: DC

## 2022-04-13 MED ORDER — BREZTRI AEROSPHERE 160-9-4.8 MCG/ACT IN AERO: 2.0000 | INHALATION_SPRAY | Freq: Two times a day (BID) | RESPIRATORY_TRACT | 3 refills | Status: DC

## 2022-04-13 NOTE — Progress Notes (Signed)
? ?'@Patient'$  ID: Morgan [name], female    DOB: 08/10/1951, 71 y.o.   MRN: [id number] ? ?Chief Complaint  ?Patient presents with  ? Consult  ?  Pt is here for consult for reoccuring bronchitis and pnuemonia. Pt states she can harldy cath her breath some days. Pt is on Advair daily.   ? ? ?Referring provider: ?[name], MD ? ?HPI:  ? ?71 y.o. woman whom we are seeing in consultation for evaluation of chronic cough/bronchitis.  Note from PCP reviewed x2. ? ?Patient reports onset of symptoms about 2 months ago.  Started with fever, runny nose, sore throat.  The symptoms resolved but cough persisted.  Productive.  Initially yellow to clear phlegm.  Worsened over the last months to more green phlegm.  Associate with worsening dyspnea on exertion.  No times a day with things are better or worse.  No position to make things better or worse.  She thinks the pollens have made nasal congestion and some cough worse given chronic issues with pollens and nasal/sinus congestion with seasonal changes in the past.  Has been using Advair without much improvement.  Was given a course of prednisone and Augmentin about 1 month ago.  This did help for period of time but symptoms have recurred and back to as bad as they were prior.  Was placed on prednisone and levofloxacin yesterday via PCP. ? ?Review chest x-ray 03/14/2022 that on my review and interpretation reveals hyperinflation with what appears to be chronic bronchial scarring/inflammation.  Review chest x-ray 04/12/2022 that on my interpretation is unchanged from earlier x-ray in March. ? ?PMH: Tobacco abuse in remission, seasonal allergies, hypertension ?Surgical history: Hysterectomy, appendectomy, esophageal dilation, knee surgery, cholecystectomy ?Family history: Mother with history of breast cancer, ovarian cancer, father with hypertension, hyperlipidemia, CAD, colon cancer in 61s ?Social history: Former smoker, Printmaker, quit 2015, retired Quarry manager ? ? ?Licensed conveyancer /  Pulmonary Flowsheets:  ? ?ACT:  ?   ? View : No data to display.  ?  ?  ?  ? ? ?MMRC: ?   ? View : No data to display.  ?  ?  ?  ? ? ?Epworth:  ?   ? View : No data to display.  ?  ?  ?  ? ? ?Tests:  ? ?FENO:  ?No results found for: NITRICOXIDE ? ?PFT: ?   ? View : No data to display.  ?  ?  ?  ? ? ?WALK:  ?   ? View : No data to display.  ?  ?  ?  ? ? ?Imaging: ?Personally reviewed and as per EMR discussion this note ?DG Chest 2 View ? ?Result Date: 03/14/2022 ?CLINICAL DATA:  Cough for 3 days. Shortness of breath. COPD and aortic aneurysm. EXAM: CHEST - 2 VIEW COMPARISON:  10/05/2021 FINDINGS: Osteopenia with vertebral augmentation at 2 lower thoracic levels. Patient rotated minimally right. Midline trachea. Normal heart size and mediastinal contours. No pleural effusion or pneumothorax. Hyperinflation and interstitial thickening. Bilateral pleuroparenchymal opacities are greater right than left. Felt to be similar to 10/31/2020. No new pulmonary opacity. IMPRESSION: Hyperinflation and interstitial coarsening, consistent with remote history of smoking. No superimposed acute process or explanation for patient's symptoms. Electronically Signed   By: Abigail Miyamoto M.D.   On: 03/14/2022 13:11   ? ?Lab Results: ?Personally reviewed ?CBC ?   ?Component Value Date/Time  ? WBC 10.0 01/26/2022 0356  ? RBC 3.76 (L) 01/26/2022 0356  ? HGB 9.9 (L)  01/26/2022 0356  ? HGB 12.4 09/18/2012 2000  ? HCT 31.9 (L) 01/26/2022 0356  ? HCT 37.3 09/18/2012 2000  ? PLT 245 01/26/2022 0356  ? PLT 450 (H) 09/18/2012 2000  ? MCV 84.8 01/26/2022 0356  ? MCV 86 09/18/2012 2000  ? MCH 26.3 01/26/2022 0356  ? MCHC 31.0 01/26/2022 0356  ? RDW 14.5 01/26/2022 0356  ? RDW 14.5 09/18/2012 2000  ? LYMPHSABS 1.4 01/18/2022 1137  ? MONOABS 0.5 01/18/2022 1137  ? EOSABS 0.1 01/18/2022 1137  ? BASOSABS 0.0 01/18/2022 1137  ? ? ?BMET ?   ?Component Value Date/Time  ? NA 138 01/29/2022 0444  ? NA 143 12/26/2018 0000  ? NA 139 09/18/2012 2000  ? K 3.4 (L)  01/29/2022 0444  ? K 3.3 (L) 09/18/2012 2000  ? CL 98 01/29/2022 0444  ? CL 99 09/18/2012 2000  ? CO2 32 01/29/2022 0444  ? CO2 33 (H) 09/18/2012 2000  ? GLUCOSE 96 01/29/2022 0444  ? GLUCOSE 111 (H) 09/18/2012 2000  ? BUN 28 (H) 01/29/2022 0444  ? BUN 16 12/26/2018 0000  ? BUN 13 09/18/2012 2000  ? CREATININE 1.13 (H) 01/29/2022 0444  ? CREATININE 0.72 09/18/2012 2000  ? CALCIUM 8.7 (L) 01/29/2022 0444  ? CALCIUM 9.4 09/18/2012 2000  ? GFRNONAA 53 (L) 01/29/2022 0444  ? GFRNONAA >60 09/18/2012 2000  ? GFRAA >60 09/02/2020 1658  ? GFRAA >60 09/18/2012 2000  ? ? ?BNP ?   ?Component Value Date/Time  ? BNP 103.0 (H) 02/20/2021 1632  ? ? ?ProBNP ?   ?Component Value Date/Time  ? PROBNP 180.0 (H) 08/29/2021 0931  ? ? ?Specialty Problems   ? ?  ? Pulmonary Problems  ? Oropharyngeal dysphagia  ? COPD exacerbation ([place])  ? COPD with acute exacerbation ([place])  ? Oxygen dependent  ?  At bedtime ? ?  ?  ? Chronic respiratory failure with hypoxia ([facility])  ? COPD (chronic obstructive pulmonary disease) ([place])  ? DOE (dyspnea on exertion)  ? ? ?Allergies  ?Allergen Reactions  ? Meperidine Hives and Anaphylaxis  ? Strawberry Extract Hives and Swelling  ? Sucralfate Nausea Only and Other (See Comments)  ?  Severe nausea ?Other reaction(s): Other (See Comments) ?Severe nausea ?Severe nausea  ? Wasp Venom Hives  ? ? ?Immunization History  ?Administered Date(s) Administered  ? Fluad Quad(high Dose 65+) 09/30/2019, 08/29/2020, 08/28/2021  ? Influenza, High Dose Seasonal PF 08/23/2018  ? Influenza,inj,Quad PF,6+ Mos 02/24/2015  ? Moderna Sars-Covid-2 Vaccination 03/18/2020, 04/15/2020  ? Pneumococcal Conjugate-13 11/12/2018  ? Pneumococcal Polysaccharide-23 02/24/2015  ? ? ?Past Medical History:  ?Diagnosis Date  ? Allergy   ? Anemia   ? Anxiety   ? Arthritis   ? CHF (congestive heart failure) ([place])   ? Depression   ? GERD (gastroesophageal reflux disease)   ? Hypertension   ? MVP (mitral valve prolapse)   ? Parkinson disease ([place])    ? ? ?Tobacco History: ?Social History  ? ?Tobacco Use  ?Smoking Status Former  ? Packs/day: 1.00  ? Years: 15.00  ? Pack years: 15.00  ? Types: Cigarettes  ? Quit date: 09/12/2017  ? Years since quitting: 4.5  ?Smokeless Tobacco Never  ? ?Counseling given: Not Answered ? ? ?Continue to not smoke ? ?Outpatient Encounter Medications as of 04/13/2022  ?Medication Sig  ? acetaminophen (TYLENOL) 325 MG tablet Take 2 tablets (650 mg total) by mouth every 4 (four) hours as needed for mild pain (or temp > 37.5 C (  99.5 F)).  ? AIMOVIG 140 MG/ML SOAJ SMARTSIG:140 Milligram(s) SUB-Q Once a Month  ? aspirin-acetaminophen-caffeine (EXCEDRIN MIGRAINE) 250-250-65 MG tablet Take 1-2 tablets by mouth every 6 (six) hours as needed for headache.  ? atorvastatin (LIPITOR) 40 MG tablet TAKE 1 TABLET BY MOUTH EVERY DAY  ? Budeson-Glycopyrrol-Formoterol (BREZTRI AEROSPHERE) 160-9-4.8 MCG/ACT AERO Inhale 2 puffs into the lungs in the morning and at bedtime.  ? carbidopa-levodopa (SINEMET IR) 25-100 MG tablet Take 1 tablet by mouth 3 (three) times daily.  ? carvedilol (COREG) 12.5 MG tablet Take 12.5 mg by mouth 2 (two) times daily.  ? Cholecalciferol 1.25 MG (50000 UT) TABS Take 1 tablet by mouth once a week.  ? divalproex (DEPAKOTE) 500 MG DR tablet Take 1 tablet (500 mg total) by mouth every 12 (twelve) hours.  ? Erenumab-aooe 140 MG/ML SOAJ Inject into the skin.  ? FLUoxetine (PROZAC) 40 MG capsule Take 40 mg by mouth daily.  ? fluticasone (FLONASE) 50 MCG/ACT nasal spray Place 1 spray into both nostrils daily.  ? furosemide (LASIX) 40 MG tablet Take 1 tablet (40 mg total) by mouth daily.  ? gabapentin (NEURONTIN) 400 MG capsule Take 400 mg by mouth 3 (three) times daily as needed (for nerve pain).  ? guaiFENesin-codeine 100-10 MG/5ML syrup Take 5 mLs by mouth 3 (three) times daily as needed for cough.  ? hydrOXYzine (ATARAX/VISTARIL) 25 MG tablet Take 25 mg by mouth every 8 (eight) hours as needed for anxiety.  ? levofloxacin (LEVAQUIN)  500 MG tablet Take 1 tablet (500 mg total) by mouth daily for 7 days.  ? mirtazapine (REMERON) 15 MG tablet Take 15 mg by mouth at bedtime.  ? montelukast (SINGULAIR) 10 MG tablet Take 1 tablet (10 mg total) by mo

## 2022-04-13 NOTE — Patient Instructions (Signed)
Nice to meet you ? ?I am sorry the cough has been so bad and lasted so long ? ?Based on how things started, I think this is prolonged bronchitis.  This can last for a period of many weeks to months following what sounds like a viral illness to trigger things.  It is quite possible your allergies are making things worse and prolonged symptoms as well. ? ?I reviewed your chest x-ray from yesterday, I do not see any evidence of pneumonia but do see signs of inflammation in the breathing tubes. ? ?Continue the prednisone and levofloxacin as prescribed by your primary care doctor ? ?Stop Advair and start Breztri 2 puffs twice a day.  Rinse your mouth out after every use.  In addition, take montelukast 1 pill at night.  Hopeful these will help with inflammation in your lungs as well as in your nose or sinuses. ? ?To help with the congestion in nose, increase Flonase to 2 sprays each nostril twice a day for 1 week, then decrease to 1 spray each nostril twice a day thereafter.  In addition, I recommend you get over-the-counter Afrin, use 2 sprays each nostril twice a day for 3 days then stop. ? ?Return to clinic in 4 weeks or sooner as needed with Dr. Silas Flood ?

## 2022-04-13 NOTE — Progress Notes (Signed)
Findings on chest xray with no acute concerns.  ?Stable findings of chronic hyperinflation, consistent with COPD status.  ?Continue meds as prescribed by Dr. Einar Pheasant.

## 2022-04-16 ENCOUNTER — Other Ambulatory Visit: Payer: Self-pay | Admitting: Family

## 2022-04-16 ENCOUNTER — Telehealth: Payer: Self-pay

## 2022-04-16 DIAGNOSIS — D631 Anemia in chronic kidney disease: Secondary | ICD-10-CM | POA: Insufficient documentation

## 2022-04-16 DIAGNOSIS — J441 Chronic obstructive pulmonary disease with (acute) exacerbation: Secondary | ICD-10-CM

## 2022-04-16 DIAGNOSIS — G8194 Hemiplegia, unspecified affecting left nondominant side: Secondary | ICD-10-CM | POA: Insufficient documentation

## 2022-04-16 DIAGNOSIS — J209 Acute bronchitis, unspecified: Secondary | ICD-10-CM

## 2022-04-16 DIAGNOSIS — I130 Hypertensive heart and chronic kidney disease with heart failure and stage 1 through stage 4 chronic kidney disease, or unspecified chronic kidney disease: Secondary | ICD-10-CM | POA: Insufficient documentation

## 2022-04-16 DIAGNOSIS — G43909 Migraine, unspecified, not intractable, without status migrainosus: Secondary | ICD-10-CM | POA: Diagnosis present

## 2022-04-16 MED ORDER — DOXYCYCLINE HYCLATE 100 MG PO TABS: 100.0000 mg | ORAL_TABLET | Freq: Two times a day (BID) | ORAL | 0 refills | Status: DC

## 2022-04-16 NOTE — Telephone Encounter (Signed)
Spoke to [facility] and they just got the [facility]vaquin in and will fill for pt now. Cancelled doxy. Notified pt.  ?

## 2022-04-16 NOTE — Telephone Encounter (Signed)
Patient was seen in our office on 4/27. Called in antibiotic but was on back order. She was seen by pulmonology on 4/28. Medication still not available. Cough increased over the weekend. Wanted to know if there is another antibiotic you can call in. She is using the inhaler and cough medicine.  ? ?[facility]  ?

## 2022-04-18 ENCOUNTER — Inpatient Hospital Stay: Payer: Medicare HMO

## 2022-04-19 ENCOUNTER — Inpatient Hospital Stay: Payer: Medicare HMO | Admitting: Oncology

## 2022-04-19 ENCOUNTER — Inpatient Hospital Stay: Payer: Medicare HMO

## 2022-04-24 ENCOUNTER — Ambulatory Visit (INDEPENDENT_AMBULATORY_CARE_PROVIDER_SITE_OTHER): Payer: Medicare HMO

## 2022-04-24 DIAGNOSIS — E538 Deficiency of other specified B group vitamins: Secondary | ICD-10-CM

## 2022-04-24 MED ORDER — CYANOCOBALAMIN 1000 MCG/ML IJ SOLN
1000.0000 ug | Freq: Once | INTRAMUSCULAR | Status: AC
  Administered 2022-04-24: 1000 ug via INTRAMUSCULAR

## 2022-04-24 NOTE — Progress Notes (Signed)
Per orders of Dr. Einar Pheasant, #3 of 3 monthly B12 1000 mcg/ml injection given by Pilar Grammes, CMA in Left Deltoid. ?Patient tolerated injection well. ? ?Forwarding to Eugenia Pancoast, NP in Dr Verda Cumins absence this afternoon. ?

## 2022-04-25 ENCOUNTER — Telehealth: Payer: Self-pay | Admitting: Family Medicine

## 2022-04-25 NOTE — Telephone Encounter (Signed)
Noted, will see patient tomorrow

## 2022-04-25 NOTE — Telephone Encounter (Signed)
Pt called and said that earlier she was at [employer] and she started getting light headed and felt like she was going to pass out, she said the nurse there checked her bp and while she was sitting down it was 127/82 and when she stood up it was 98/70. She hasnt been checking her BP but she has been feeling that way for a while. I scheduled pt for tomorrow morning ?

## 2022-04-26 ENCOUNTER — Ambulatory Visit (INDEPENDENT_AMBULATORY_CARE_PROVIDER_SITE_OTHER): Payer: Medicare HMO | Admitting: Family Medicine

## 2022-04-26 ENCOUNTER — Telehealth: Payer: Self-pay | Admitting: Family Medicine

## 2022-04-26 VITALS — BP 100/68 | HR 71 | Temp 96.6°F | Ht 73.0 in | Wt 206.1 lb

## 2022-04-26 DIAGNOSIS — I5032 Chronic diastolic (congestive) heart failure: Secondary | ICD-10-CM

## 2022-04-26 DIAGNOSIS — I10 Essential (primary) hypertension: Secondary | ICD-10-CM | POA: Diagnosis not present

## 2022-04-26 DIAGNOSIS — J449 Chronic obstructive pulmonary disease, unspecified: Secondary | ICD-10-CM

## 2022-04-26 DIAGNOSIS — G20 Parkinson's disease: Secondary | ICD-10-CM | POA: Diagnosis not present

## 2022-04-26 DIAGNOSIS — E782 Mixed hyperlipidemia: Secondary | ICD-10-CM

## 2022-04-26 DIAGNOSIS — N1831 Chronic kidney disease, stage 3a: Secondary | ICD-10-CM | POA: Diagnosis not present

## 2022-04-26 DIAGNOSIS — I952 Hypotension due to drugs: Secondary | ICD-10-CM

## 2022-04-26 DIAGNOSIS — R7309 Other abnormal glucose: Secondary | ICD-10-CM

## 2022-04-26 NOTE — Patient Instructions (Addendum)
Orthostatic hypotension ?- Take 1/2 dose of Valsartan daily ?- Replace at least 1/2 of your daily beverages with Water ?- Change lasix to every other day ?- wear compression socks ?- slow transitions  ? ?Check weight daily ?Monitor for swelling in legs ?-- Take dose of lasix if you gain 2 lbs or you notice swelling in your legs or have shortness of breath ? ? ?

## 2022-04-26 NOTE — Telephone Encounter (Signed)
Pt called and stated, she received a call from our office but did not know who called and was the reason was for. Wants to speak with the nurse.  ? ?Callback Number: [phone] ?

## 2022-04-26 NOTE — Progress Notes (Signed)
? ?Subjective:  ? ?  ?Morgan [name] is a 71 y.o. female presenting for Hypotension ?  ? ? ?HPI ? ?#Lightheadedness ?- started a few months ago  ?- getting worse ?- persistent nausea - taking several times a day - worse with standing ?- taking zofran 4 mg 3-4 times daily ?- valsartan, lasix, carvedilol 12.5 mg ?- thirsty all the time, dry mouth ?- hydration - drinks 8 oz of coke, 12 oz smoothie, 3-4 glasses of tea daily ? ?#SOB/cough ?- saw pulmonology changed inhaler ?- improved breathing ? ?Review of Systems  ?Constitutional:  Positive for fatigue. Negative for chills and fever.  ?HENT:  Negative for congestion.   ?Respiratory:  Positive for shortness of breath (baseline).   ?Gastrointestinal:  Positive for nausea.  ?Musculoskeletal:  Negative for arthralgias and myalgias.  ?Neurological:  Positive for light-headedness. Negative for dizziness.  ? ? ?Social History  ? ?Tobacco Use  ?Smoking Status Former  ? Packs/day: 1.00  ? Years: 15.00  ? Pack years: 15.00  ? Types: Cigarettes  ? Quit date: 09/12/2017  ? Years since quitting: 4.6  ?Smokeless Tobacco Never  ? ? ? ?   ?Objective:  ?  ?BP Readings from Last 3 Encounters:  ?04/26/22 100/68  ?04/13/22 124/66  ?04/12/22 (!) 110/50  ? ?Wt Readings from Last 3 Encounters:  ?04/26/22 206 lb 2 oz (93.5 kg)  ?04/13/22 214 lb 9.6 oz (97.3 kg)  ?04/12/22 212 lb 7 oz (96.4 kg)  ? ? ?BP 100/68   Pulse 71   Temp (!) 96.6 ?F (35.9 ?C) (Temporal)   Ht '6\' 1"'$  (1.854 m)   Wt 206 lb 2 oz (93.5 kg)   SpO2 94%   BMI 27.19 kg/m?  ? ? ?Physical Exam ?Constitutional:   ?   General: She is not in acute distress. ?   Appearance: She is well-developed. She is not diaphoretic.  ?HENT:  ?   Right Ear: External ear normal.  ?   Left Ear: External ear normal.  ?   Nose: Nose normal.  ?Eyes:  ?   Conjunctiva/sclera: Conjunctivae normal.  ?Cardiovascular:  ?   Rate and Rhythm: Normal rate and regular rhythm.  ?   Heart sounds: Murmur heard.  ?Pulmonary:  ?   Effort: Pulmonary effort is  normal. No respiratory distress.  ?   Breath sounds: Wheezing present. No rhonchi or rales.  ?Abdominal:  ?   General: Abdomen is flat.  ?   Palpations: Abdomen is soft.  ?   Tenderness: There is abdominal tenderness.  ?Musculoskeletal:  ?   Cervical back: Neck supple.  ?   Right lower leg: No edema.  ?   Left lower leg: No edema.  ?Skin: ?   General: Skin is warm and dry.  ?   Capillary Refill: Capillary refill takes less than 2 seconds.  ?   Comments: Dry skin, with poor turgor.   ?Neurological:  ?   Mental Status: She is alert. Mental status is at baseline.  ?Psychiatric:     ?   Mood and Affect: Mood normal.     ?   Behavior: Behavior normal.  ? ? ? ? ? ?   ?Assessment & Plan:  ? ?Problem List Items Addressed This Visit   ? ?  ? Cardiovascular and Mediastinum  ? Essential hypertension - Primary  ?  Blood pressure appears to be overtreated in setting of hypotension with normal blood pressure levels and laying in sitting position.  Continue  carvedilol 12.5 mg twice daily.  Decrease Valsartan from 320 mg daily to 160 mg daily.  Advised trying to get a blood pressure cuff through her insurance.  She does have an appointment with oncology in 2 weeks we will follow-up blood pressure from that visit.  Return to the office for blood pressure check in 4 weeks. ? ?  ?  ? Relevant Orders  ? Comprehensive metabolic panel  ? CBC with Differential  ? TSH  ? Chronic diastolic CHF (congestive heart failure) ([place])  ?  Dry on exam today.  Suspect she is over diuresed contributing to her hypotension.  Discussed monitoring daily weights, as well as signs of edema in lower extremities.  She will decrease Lasix to every other day, taking a pill if she gains weight or has swelling.  Weight is down approximately 4 pounds since her last visit. ? ?  ?  ? Hypotension  ?  Likely secondary to medication see prior other changes.  She also only drinks sugary caffeinated beverages throughout the day.  Advised trying to switch to 50% water and  replacing some of her caffeinated beverages.  Compression socks and slow transitions.   ? ?  ?  ? Relevant Orders  ? Comprehensive metabolic panel  ? CBC with Differential  ?  ? Respiratory  ? COPD (chronic obstructive pulmonary disease) ([place])  ?  She notes breathing has improved since course of steroids antibiotics and changing inhaler to [employer].  Appreciate pulmonology support, reviewed notes she is going to get PFTs and follow-up in 3 months. ? ?  ?  ?  ? Nervous and Auditory  ? Parkinson's disease ([place])  ?  Sinemet seems to be helping with some of her symptoms, however reviewed that this causes orthostatic hypotension and 60% of individuals.  Will focus on lowering blood pressure medications and adjusting Lasix.  But may need to consider follow-up with neurology if limited improvement ? ?  ?  ?  ? Genitourinary  ? Stage 3a chronic kidney disease ([place])  ? Relevant Orders  ? Comprehensive metabolic panel  ?  ? Other  ? HLD (hyperlipidemia)  ?  Stable. Cont atorvastatin 40 mg. Recheck labs.  ? ?  ?  ? ?Other Visit Diagnoses   ? ? Elevated glucose      ? Relevant Orders  ? Hemoglobin A1c  ? ?  ? ?I spent 52 minutes with pt , obtaining history, examining, reviewing chart, documenting encounter and discussing the above plan of care. ? ? ?Return in about 4 weeks (around 05/24/2022) for weight and lightheadedness. ? ?[name], MD ? ? ? ?

## 2022-04-26 NOTE — Assessment & Plan Note (Signed)
Likely secondary to medication see prior other changes.  She also only drinks sugary caffeinated beverages throughout the day.  Advised trying to switch to 50% water and replacing some of her caffeinated beverages.  Compression socks and slow transitions.   ?

## 2022-04-26 NOTE — Assessment & Plan Note (Signed)
Blood pressure appears to be overtreated in setting of hypotension with normal blood pressure levels and laying in sitting position.  Continue carvedilol 12.5 mg twice daily.  Decrease Valsartan from 320 mg daily to 160 mg daily.  Advised trying to get a blood pressure cuff through her insurance.  She does have an appointment with oncology in 2 weeks we will follow-up blood pressure from that visit.  Return to the office for blood pressure check in 4 weeks. ?

## 2022-04-26 NOTE — Telephone Encounter (Signed)
Left a VM for pt letting her know that I did not call and im not sure who did ?

## 2022-04-26 NOTE — Assessment & Plan Note (Signed)
Dry on exam today.  Suspect she is over diuresed contributing to her hypotension.  Discussed monitoring daily weights, as well as signs of edema in lower extremities.  She will decrease Lasix to every other day, taking a pill if she gains weight or has swelling.  Weight is down approximately 4 pounds since her last visit. ?

## 2022-04-26 NOTE — Assessment & Plan Note (Signed)
Sinemet seems to be helping with some of her symptoms, however reviewed that this causes orthostatic hypotension and 60% of individuals.  Will focus on lowering blood pressure medications and adjusting Lasix.  But may need to consider follow-up with neurology if limited improvement ?

## 2022-04-26 NOTE — Assessment & Plan Note (Signed)
She notes breathing has improved since course of steroids antibiotics and changing inhaler to [employer].  Appreciate pulmonology support, reviewed notes she is going to get PFTs and follow-up in 3 months. ?

## 2022-04-26 NOTE — Assessment & Plan Note (Signed)
Stable. Cont atorvastatin 40 mg. Recheck labs.  ?

## 2022-04-26 NOTE — Addendum Note (Signed)
Addended by: Ellamae Sia on: 04/26/2022 02:16 PM ? ? Modules accepted: Orders ? ?

## 2022-04-27 LAB — CBC WITH DIFFERENTIAL/PLATELET
Basophils Absolute: 0.1 10*3/uL (ref 0.0–0.1)
Basophils Relative: 0.5 % (ref 0.0–3.0)
Eosinophils Absolute: 0.1 10*3/uL (ref 0.0–0.7)
Eosinophils Relative: 1.1 % (ref 0.0–5.0)
HCT: 32.8 % — ABNORMAL LOW (ref 36.0–46.0)
Hemoglobin: 10.3 g/dL — ABNORMAL LOW (ref 12.0–15.0)
Lymphocytes Relative: 20.6 % (ref 12.0–46.0)
Lymphs Abs: 2.8 10*3/uL (ref 0.7–4.0)
MCHC: 31.5 g/dL (ref 30.0–36.0)
MCV: 80.3 fl (ref 78.0–100.0)
Monocytes Absolute: 1.1 10*3/uL — ABNORMAL HIGH (ref 0.1–1.0)
Monocytes Relative: 7.7 % (ref 3.0–12.0)
Neutro Abs: 9.6 10*3/uL — ABNORMAL HIGH (ref 1.4–7.7)
Neutrophils Relative %: 70.1 % (ref 43.0–77.0)
Platelets: 483 10*3/uL — ABNORMAL HIGH (ref 150.0–400.0)
RBC: 4.09 Mil/uL (ref 3.87–5.11)
RDW: 14.8 % (ref 11.5–15.5)
WBC: 13.6 10*3/uL — ABNORMAL HIGH (ref 4.0–10.5)

## 2022-04-27 LAB — COMPREHENSIVE METABOLIC PANEL
ALT: 4 U/L (ref 0–35)
AST: 8 U/L (ref 0–37)
Albumin: 3.5 g/dL (ref 3.5–5.2)
Alkaline Phosphatase: 109 U/L (ref 39–117)
BUN: 16 mg/dL (ref 6–23)
CO2: 36 mEq/L — ABNORMAL HIGH (ref 19–32)
Calcium: 8.4 mg/dL (ref 8.4–10.5)
Chloride: 93 mEq/L — ABNORMAL LOW (ref 96–112)
Creatinine, Ser: 1.13 mg/dL (ref 0.40–1.20)
GFR: 49.87 mL/min — ABNORMAL LOW (ref >60.00)
Glucose, Bld: 120 mg/dL — ABNORMAL HIGH (ref 70–99)
Potassium: 4 mEq/L (ref 3.5–5.1)
Sodium: 140 mEq/L (ref 135–145)
Total Bilirubin: 0.5 mg/dL (ref 0.2–1.2)
Total Protein: 6.2 g/dL (ref 6.0–8.3)

## 2022-04-27 LAB — TSH: TSH: 2.21 u[IU]/mL (ref 0.35–5.50)

## 2022-04-27 LAB — HEMOGLOBIN A1C: Hgb A1c MFr Bld: 6.2 % (ref 4.6–6.5)

## 2022-04-30 ENCOUNTER — Telehealth: Payer: Self-pay

## 2022-04-30 NOTE — Telephone Encounter (Signed)
Left v/m requesting pt to cb to [facility] on 05/01/22. Sending note to [facility] CMA. ?

## 2022-04-30 NOTE — Telephone Encounter (Signed)
Patient states she was seen in office last week with same symptoms. She was instructed to decrease her blood pressure medications. She has reduced as directed. She feels like the dizziness has increased. At times when standing she will feel nauseous and feels like she is going to pass out. If she is laying or sitting she is fine. After she is standing symptoms only increase until she is sitting.  ?Carvedilol she is taking 1/2 in am. She d/c lasix.  ?

## 2022-04-30 NOTE — Telephone Encounter (Signed)
Has she been able to get a BP cuff?  ? ?Advise decreasing Carvedilol to 1/2 tablet twice daily.  ? ?(Unless only take 1/2 tablet in the morning).  ? ?Hold Valsartan ? ?Please have her reach out to schedule cardiology appointment.  ? ?Have her see me in 2-3 days. Unless seeing cardiology this week.  ? ? ?

## 2022-05-01 NOTE — Telephone Encounter (Signed)
Pt notified as instructed and pt voiced understanding; pt said she has not gotten BP cuff yet because if she has a prescription for BP cuff pts ins will pay for it. Pt request order [facility]. Pt has been taking Carvedilol 12.5 mg taking 1/2 tab daily; (med list updated). Pt will hold Valsartan and pt is going to call for cardiology appt. If pt cannot get cardiology appt this week pt will cb and schedule appt with Dr Einar Pheasant this week.  Pt said she still feels dizzy and shaky and when pt stands slowly feels like going to pass out due to dizziness. Pt said she has not passed out pt just hates the dizziness. UC & ED precautions given and pt voiced understanding. Sending note to Dr Einar Pheasant. ?

## 2022-05-01 NOTE — Telephone Encounter (Signed)
Agree with recommendations and plan

## 2022-05-04 ENCOUNTER — Other Ambulatory Visit: Payer: Self-pay

## 2022-05-04 ENCOUNTER — Inpatient Hospital Stay
Admission: EM | Admit: 2022-05-04 | Discharge: 2022-05-10 | DRG: 392 | Disposition: A | Discharge status: Home / Self Care | Payer: Medicare HMO | Attending: Internal Medicine | Admitting: Internal Medicine

## 2022-05-04 ENCOUNTER — Emergency Department: Payer: Medicare HMO

## 2022-05-04 ENCOUNTER — Encounter: Payer: Self-pay | Admitting: Radiology

## 2022-05-04 DIAGNOSIS — R9431 Abnormal electrocardiogram [ECG] [EKG]: Secondary | ICD-10-CM | POA: Diagnosis present

## 2022-05-04 DIAGNOSIS — I700 Atherosclerosis of aorta: Secondary | ICD-10-CM | POA: Diagnosis not present

## 2022-05-04 DIAGNOSIS — K219 Gastro-esophageal reflux disease without esophagitis: Secondary | ICD-10-CM | POA: Diagnosis not present

## 2022-05-04 DIAGNOSIS — D649 Anemia, unspecified: Secondary | ICD-10-CM | POA: Diagnosis not present

## 2022-05-04 DIAGNOSIS — J9610 Chronic respiratory failure, unspecified whether with hypoxia or hypercapnia: Secondary | ICD-10-CM | POA: Diagnosis present

## 2022-05-04 DIAGNOSIS — R110 Nausea: Secondary | ICD-10-CM

## 2022-05-04 DIAGNOSIS — R1013 Epigastric pain: Secondary | ICD-10-CM | POA: Diagnosis present

## 2022-05-04 DIAGNOSIS — Z9981 Dependence on supplemental oxygen: Secondary | ICD-10-CM

## 2022-05-04 DIAGNOSIS — G20 Parkinson's disease: Secondary | ICD-10-CM | POA: Diagnosis not present

## 2022-05-04 DIAGNOSIS — R4182 Altered mental status, unspecified: Secondary | ICD-10-CM | POA: Diagnosis not present

## 2022-05-04 DIAGNOSIS — I5032 Chronic diastolic (congestive) heart failure: Secondary | ICD-10-CM | POA: Diagnosis present

## 2022-05-04 DIAGNOSIS — R109 Unspecified abdominal pain: Secondary | ICD-10-CM | POA: Diagnosis not present

## 2022-05-04 DIAGNOSIS — F419 Anxiety disorder, unspecified: Secondary | ICD-10-CM | POA: Diagnosis present

## 2022-05-04 DIAGNOSIS — Z79899 Other long term (current) drug therapy: Secondary | ICD-10-CM

## 2022-05-04 DIAGNOSIS — K5730 Diverticulosis of large intestine without perforation or abscess without bleeding: Principal | ICD-10-CM | POA: Diagnosis present

## 2022-05-04 DIAGNOSIS — J9611 Chronic respiratory failure with hypoxia: Secondary | ICD-10-CM | POA: Diagnosis present

## 2022-05-04 DIAGNOSIS — F339 Major depressive disorder, recurrent, unspecified: Secondary | ICD-10-CM | POA: Diagnosis present

## 2022-05-04 DIAGNOSIS — F32A Depression, unspecified: Secondary | ICD-10-CM | POA: Diagnosis present

## 2022-05-04 DIAGNOSIS — R112 Nausea with vomiting, unspecified: Secondary | ICD-10-CM | POA: Diagnosis present

## 2022-05-04 DIAGNOSIS — E785 Hyperlipidemia, unspecified: Secondary | ICD-10-CM | POA: Diagnosis present

## 2022-05-04 DIAGNOSIS — Z87891 Personal history of nicotine dependence: Secondary | ICD-10-CM

## 2022-05-04 DIAGNOSIS — E860 Dehydration: Secondary | ICD-10-CM | POA: Diagnosis present

## 2022-05-04 DIAGNOSIS — N1831 Chronic kidney disease, stage 3a: Secondary | ICD-10-CM | POA: Diagnosis present

## 2022-05-04 DIAGNOSIS — R42 Dizziness and giddiness: Secondary | ICD-10-CM | POA: Diagnosis not present

## 2022-05-04 DIAGNOSIS — E876 Hypokalemia: Secondary | ICD-10-CM | POA: Diagnosis present

## 2022-05-04 DIAGNOSIS — Z83438 Family history of other disorder of lipoprotein metabolism and other lipidemia: Secondary | ICD-10-CM

## 2022-05-04 DIAGNOSIS — R404 Transient alteration of awareness: Secondary | ICD-10-CM | POA: Diagnosis not present

## 2022-05-04 DIAGNOSIS — Z9102 Food additives allergy status: Secondary | ICD-10-CM

## 2022-05-04 DIAGNOSIS — Z7951 Long term (current) use of inhaled steroids: Secondary | ICD-10-CM

## 2022-05-04 DIAGNOSIS — I878 Other specified disorders of veins: Secondary | ICD-10-CM | POA: Diagnosis not present

## 2022-05-04 DIAGNOSIS — D75839 Thrombocytosis, unspecified: Secondary | ICD-10-CM | POA: Diagnosis present

## 2022-05-04 DIAGNOSIS — Z888 Allergy status to other drugs, medicaments and biological substances status: Secondary | ICD-10-CM

## 2022-05-04 DIAGNOSIS — R55 Syncope and collapse: Secondary | ICD-10-CM | POA: Diagnosis not present

## 2022-05-04 DIAGNOSIS — I491 Atrial premature depolarization: Secondary | ICD-10-CM | POA: Diagnosis not present

## 2022-05-04 DIAGNOSIS — R1084 Generalized abdominal pain: Secondary | ICD-10-CM

## 2022-05-04 DIAGNOSIS — I10 Essential (primary) hypertension: Secondary | ICD-10-CM | POA: Diagnosis not present

## 2022-05-04 DIAGNOSIS — G20C Parkinsonism, unspecified: Secondary | ICD-10-CM | POA: Diagnosis present

## 2022-05-04 DIAGNOSIS — J449 Chronic obstructive pulmonary disease, unspecified: Secondary | ICD-10-CM | POA: Diagnosis not present

## 2022-05-04 DIAGNOSIS — Z743 Need for continuous supervision: Secondary | ICD-10-CM | POA: Diagnosis not present

## 2022-05-04 DIAGNOSIS — I130 Hypertensive heart and chronic kidney disease with heart failure and stage 1 through stage 4 chronic kidney disease, or unspecified chronic kidney disease: Secondary | ICD-10-CM | POA: Diagnosis present

## 2022-05-04 DIAGNOSIS — Z91030 Bee allergy status: Secondary | ICD-10-CM

## 2022-05-04 DIAGNOSIS — G43409 Hemiplegic migraine, not intractable, without status migrainosus: Secondary | ICD-10-CM | POA: Diagnosis present

## 2022-05-04 DIAGNOSIS — K319 Disease of stomach and duodenum, unspecified: Secondary | ICD-10-CM

## 2022-05-04 DIAGNOSIS — Z8249 Family history of ischemic heart disease and other diseases of the circulatory system: Secondary | ICD-10-CM

## 2022-05-04 DIAGNOSIS — Z8711 Personal history of peptic ulcer disease: Secondary | ICD-10-CM

## 2022-05-04 DIAGNOSIS — R1110 Vomiting, unspecified: Secondary | ICD-10-CM | POA: Diagnosis not present

## 2022-05-04 LAB — CBC
HCT: 34 % — ABNORMAL LOW (ref 36.0–46.0)
Hemoglobin: 10.6 g/dL — ABNORMAL LOW (ref 12.0–15.0)
MCH: 25 pg — ABNORMAL LOW (ref 26.0–34.0)
MCHC: 31.2 g/dL (ref 30.0–36.0)
MCV: 80.2 fL (ref 80.0–100.0)
Platelets: 407 10*3/uL — ABNORMAL HIGH (ref 150–400)
RBC: 4.24 MIL/uL (ref 3.87–5.11)
RDW: 14 % (ref 11.5–15.5)
WBC: 6.6 10*3/uL (ref 4.0–10.5)
nRBC: 0 % (ref 0.0–0.2)

## 2022-05-04 LAB — HEPATIC FUNCTION PANEL
ALT: 9 U/L (ref 0–44)
AST: 18 U/L (ref 15–41)
Albumin: 3.1 g/dL — ABNORMAL LOW (ref 3.5–5.0)
Alkaline Phosphatase: 120 U/L (ref 38–126)
Bilirubin, Direct: 0.2 mg/dL (ref 0.0–0.2)
Indirect Bilirubin: 0.7 mg/dL (ref 0.3–0.9)
Total Bilirubin: 0.9 mg/dL (ref 0.3–1.2)
Total Protein: 6.8 g/dL (ref 6.5–8.1)

## 2022-05-04 LAB — BASIC METABOLIC PANEL
Anion gap: 9 (ref 5–15)
BUN: 23 mg/dL (ref 8–23)
CO2: 33 mmol/L — ABNORMAL HIGH (ref 22–32)
Calcium: 8.7 mg/dL — ABNORMAL LOW (ref 8.9–10.3)
Chloride: 94 mmol/L — ABNORMAL LOW (ref 98–111)
Creatinine, Ser: 1.05 mg/dL — ABNORMAL HIGH (ref 0.44–1.00)
GFR, Estimated: 58 mL/min — ABNORMAL LOW (ref >60)
Glucose, Bld: 126 mg/dL — ABNORMAL HIGH (ref 70–99)
Potassium: 2.8 mmol/L — ABNORMAL LOW (ref 3.5–5.1)
Sodium: 136 mmol/L (ref 135–145)

## 2022-05-04 LAB — MAGNESIUM: Magnesium: 1.9 mg/dL (ref 1.7–2.4)

## 2022-05-04 LAB — TROPONIN I (HIGH SENSITIVITY): Troponin I (High Sensitivity): 5 ng/L (ref <18)

## 2022-05-04 LAB — LACTIC ACID, PLASMA: Lactic Acid, Venous: 1.4 mmol/L (ref 0.5–1.9)

## 2022-05-04 LAB — LIPASE, BLOOD: Lipase: 21 U/L (ref 11–51)

## 2022-05-04 LAB — BRAIN NATRIURETIC PEPTIDE: B Natriuretic Peptide: 69 pg/mL (ref 0.0–100.0)

## 2022-05-04 MED ORDER — CARBIDOPA-LEVODOPA 25-100 MG PO TABS
2.0000 | ORAL_TABLET | Freq: Three times a day (TID) | ORAL | Status: DC
  Administered 2022-05-04 – 2022-05-10 (×16): 2 via ORAL
  Filled 2022-05-04 (×17): qty 2

## 2022-05-04 MED ORDER — POTASSIUM CHLORIDE 10 MEQ/100ML IV SOLN
10.0000 meq | INTRAVENOUS | Status: AC
  Administered 2022-05-04: 10 meq via INTRAVENOUS
  Filled 2022-05-04 (×2): qty 100

## 2022-05-04 MED ORDER — FLUTICASONE FUROATE-VILANTEROL 200-25 MCG/ACT IN AEPB
1.0000 | INHALATION_SPRAY | Freq: Every day | RESPIRATORY_TRACT | Status: DC
  Administered 2022-05-04 – 2022-05-10 (×7): 1 via RESPIRATORY_TRACT
  Filled 2022-05-04 (×2): qty 28

## 2022-05-04 MED ORDER — PANTOPRAZOLE SODIUM 40 MG IV SOLR
40.0000 mg | Freq: Two times a day (BID) | INTRAVENOUS | Status: DC
  Administered 2022-05-04 – 2022-05-08 (×9): 40 mg via INTRAVENOUS
  Filled 2022-05-04 (×9): qty 10

## 2022-05-04 MED ORDER — HYDROXYZINE HCL 25 MG PO TABS
25.0000 mg | ORAL_TABLET | Freq: Three times a day (TID) | ORAL | Status: DC | PRN
  Administered 2022-05-09: 25 mg via ORAL
  Filled 2022-05-04: qty 1

## 2022-05-04 MED ORDER — ALBUTEROL SULFATE HFA 108 (90 BASE) MCG/ACT IN AERS
2.0000 | INHALATION_SPRAY | RESPIRATORY_TRACT | Status: DC | PRN
Start: 2022-05-04 — End: 2022-05-10

## 2022-05-04 MED ORDER — MAGNESIUM SULFATE 2 GM/50ML IV SOLN
2.0000 g | Freq: Once | INTRAVENOUS | Status: AC
  Administered 2022-05-04: 2 g via INTRAVENOUS
  Filled 2022-05-04: qty 50

## 2022-05-04 MED ORDER — POTASSIUM CHLORIDE 10 MEQ/100ML IV SOLN
10.0000 meq | Freq: Once | INTRAVENOUS | Status: AC
Start: 2022-05-04 — End: 2022-05-04
  Administered 2022-05-04: 10 meq via INTRAVENOUS
  Filled 2022-05-04: qty 100

## 2022-05-04 MED ORDER — BUDESON-GLYCOPYRROL-FORMOTEROL 160-9-4.8 MCG/ACT IN AERO: 2.0000 | INHALATION_SPRAY | Freq: Every day | RESPIRATORY_TRACT | Status: DC

## 2022-05-04 MED ORDER — ATORVASTATIN CALCIUM 20 MG PO TABS
40.0000 mg | ORAL_TABLET | Freq: Every day | ORAL | Status: DC
  Administered 2022-05-04 – 2022-05-10 (×6): 40 mg via ORAL
  Filled 2022-05-04 (×6): qty 2

## 2022-05-04 MED ORDER — POTASSIUM CHLORIDE 10 MEQ/100ML IV SOLN
10.0000 meq | INTRAVENOUS | Status: AC
  Filled 2022-05-04 (×3): qty 100

## 2022-05-04 MED ORDER — ONDANSETRON HCL 4 MG/2ML IJ SOLN: 4.0000 mg | Freq: Once | INTRAMUSCULAR | Status: AC

## 2022-05-04 MED ORDER — ENOXAPARIN SODIUM 40 MG/0.4ML IJ SOSY
40.0000 mg | PREFILLED_SYRINGE | INTRAMUSCULAR | Status: DC
  Administered 2022-05-04 – 2022-05-09 (×6): 40 mg via SUBCUTANEOUS
  Filled 2022-05-04 (×6): qty 0.4

## 2022-05-04 MED ORDER — ONDANSETRON HCL 4 MG/2ML IJ SOLN
INTRAMUSCULAR | Status: AC
Start: 2022-05-04 — End: 2022-05-04
  Administered 2022-05-04: 4 mg via INTRAVENOUS
  Filled 2022-05-04: qty 2

## 2022-05-04 MED ORDER — SODIUM CHLORIDE 0.9 % IV SOLN: INTRAVENOUS | Status: DC

## 2022-05-04 MED ORDER — ACETAMINOPHEN 325 MG PO TABS
650.0000 mg | ORAL_TABLET | Freq: Four times a day (QID) | ORAL | Status: DC | PRN
Start: 2022-05-04 — End: 2022-05-10
  Administered 2022-05-04: 650 mg via ORAL
  Filled 2022-05-04: qty 2

## 2022-05-04 MED ORDER — HYDRALAZINE HCL 20 MG/ML IJ SOLN: 5.0000 mg | INTRAMUSCULAR | Status: DC | PRN

## 2022-05-04 MED ORDER — DIPHENHYDRAMINE HCL 50 MG/ML IJ SOLN
12.5000 mg | Freq: Three times a day (TID) | INTRAMUSCULAR | Status: DC | PRN
  Administered 2022-05-05 – 2022-05-10 (×13): 12.5 mg via INTRAVENOUS
  Filled 2022-05-04 (×14): qty 1

## 2022-05-04 MED ORDER — METOCLOPRAMIDE HCL 5 MG/ML IJ SOLN
10.0000 mg | Freq: Once | INTRAMUSCULAR | Status: AC
Start: 2022-05-04 — End: 2022-05-04
  Administered 2022-05-04: 10 mg via INTRAVENOUS
  Filled 2022-05-04: qty 2

## 2022-05-04 MED ORDER — POTASSIUM CHLORIDE CRYS ER 20 MEQ PO TBCR
40.0000 meq | EXTENDED_RELEASE_TABLET | ORAL | Status: AC
  Administered 2022-05-04: 40 meq via ORAL
  Filled 2022-05-04: qty 2

## 2022-05-04 MED ORDER — IOHEXOL 300 MG/ML  SOLN
100.0000 mL | Freq: Once | INTRAMUSCULAR | Status: AC | PRN
  Administered 2022-05-04: 100 mL via INTRAVENOUS

## 2022-05-04 MED ORDER — SODIUM CHLORIDE 0.9 % IV BOLUS
500.0000 mL | Freq: Once | INTRAVENOUS | Status: AC
  Administered 2022-05-04: 500 mL via INTRAVENOUS

## 2022-05-04 MED ORDER — UMECLIDINIUM BROMIDE 62.5 MCG/ACT IN AEPB
1.0000 | INHALATION_SPRAY | Freq: Every day | RESPIRATORY_TRACT | Status: DC
  Administered 2022-05-04 – 2022-05-10 (×6): 1 via RESPIRATORY_TRACT
  Filled 2022-05-04 (×2): qty 7

## 2022-05-04 MED ORDER — ASPIRIN-ACETAMINOPHEN-CAFFEINE 250-250-65 MG PO TABS: 1.0000 | ORAL_TABLET | Freq: Four times a day (QID) | ORAL | Status: DC | PRN

## 2022-05-04 MED ORDER — MORPHINE SULFATE (PF) 4 MG/ML IV SOLN
4.0000 mg | Freq: Once | INTRAVENOUS | Status: AC
  Administered 2022-05-04: 4 mg via INTRAVENOUS
  Filled 2022-05-04: qty 1

## 2022-05-04 MED ORDER — MORPHINE SULFATE (PF) 2 MG/ML IV SOLN
2.0000 mg | INTRAVENOUS | Status: DC | PRN
  Administered 2022-05-04 – 2022-05-10 (×25): 2 mg via INTRAVENOUS
  Filled 2022-05-04 (×25): qty 1

## 2022-05-04 NOTE — Assessment & Plan Note (Signed)
Lipitor 

## 2022-05-04 NOTE — Assessment & Plan Note (Signed)
-   Sinemet 

## 2022-05-04 NOTE — Assessment & Plan Note (Addendum)
Stable, on 3 L oxygen at home, currently oxygen saturation 98% on 2-4 L oxygen. -Bronchodilators

## 2022-05-04 NOTE — Assessment & Plan Note (Addendum)
-   Hold Prozac and Remeron due to QTc prolongation -continue hydroxyzine

## 2022-05-04 NOTE — ED Triage Notes (Signed)
Patient arrived by EMS from home for near syncopal episode. EMS reports multiple episodes of these for months. Cardiologist and neuro appointments were scheduled for today that she missed. EMS vitals 170/100b/p, 89p, 18RR, 99% 2L O2. Wears O2 as needed at home and EMS reports 90% RA. CBG 143

## 2022-05-04 NOTE — Assessment & Plan Note (Signed)
2D echo on 01/19/2022 showed EF of 60 to 65% with grade 1 diastolic dysfunction.  Patient does not have leg edema or JVD.  CHF seem to be compensated. -Hold Lasix since patient need IV fluid -Check BNP

## 2022-05-04 NOTE — ED Provider Notes (Signed)
[facility] Provider Note    Event Date/Time   First MD Initiated Contact with Patient 05/04/22 1238     (approximate)   History   Near Syncope   HPI  Morgan [name] is a 71 y.o. female with a history of hypertension, chronic kidney disease, CHF, Parkinson's, anemia, and arthritis who presents with nausea over the last 2 days, persistent course, associated with dry heaving but no vomiting.  The patient has also had central crampy abdominal pain which is constant.  She denies associated diarrhea.  The patient reports dizziness described as feeling lightheaded or like she will pass out and decreased appetite.  She has not been able to hold anything down over the last several days.  The dizziness has been going on for a few weeks and she was taken off some of her blood pressure medication.  She denies chest pain but does feel like her chest is tight.  She has no cough or fever.  She reports a history of a cholecystectomy a year ago   Physical Exam   Triage Vital Signs: ED Triage Vitals  Enc Vitals Group     BP 05/04/22 0919 (!) 149/79     Pulse Rate 05/04/22 0919 78     Resp 05/04/22 0919 20     Temp 05/04/22 0919 97.7 F (36.5 C)     Temp Source 05/04/22 0919 Axillary     SpO2 05/04/22 0919 98 %     Weight 05/04/22 0915 200 lb (90.7 kg)     Height 05/04/22 0915 '6\' 1"'$  (1.854 m)     Head Circumference --      Peak Flow --      Pain Score 05/04/22 0913 10     Pain Loc --      Pain Edu? --      Excl. in [place]? --     Most recent vital signs: Vitals:   05/04/22 0919 05/04/22 1248  BP: (!) 149/79 (!) 162/96  Pulse: 78 91  Resp: 20 (!) 30  Temp: 97.7 F (36.5 C)   SpO2: 98% 100%     General: Alert and oriented, uncomfortable appearing but in no acute distress. CV:  Good peripheral perfusion.  Normal heart sounds. Resp:  Slightly increased respiratory effort.  Lungs CTA B. Abd:  Soft with some distention and moderate periumbilical tenderness.    Other:  Dry mucous membranes.  No scleral icterus.  No significant peripheral edema.   ED Results / Procedures / Treatments   Labs (all labs ordered are listed, but only abnormal results are displayed) Labs Reviewed  BASIC METABOLIC PANEL - Abnormal; Notable for the following components:      Result Value   Potassium 2.8 (*)    Chloride 94 (*)    CO2 33 (*)    Glucose, Bld 126 (*)    Creatinine, Ser 1.05 (*)    Calcium 8.7 (*)    GFR, Estimated 58 (*)    All other components within normal limits  CBC - Abnormal; Notable for the following components:   Hemoglobin 10.6 (*)    HCT 34.0 (*)    MCH 25.0 (*)    Platelets 407 (*)    All other components within normal limits  HEPATIC FUNCTION PANEL - Abnormal; Notable for the following components:   Albumin 3.1 (*)    All other components within normal limits  LACTIC ACID, PLASMA  LIPASE, BLOOD  URINALYSIS, ROUTINE W REFLEX MICROSCOPIC  MAGNESIUM  CBG MONITORING, ED  TROPONIN I (HIGH SENSITIVITY)     EKG  ED ECG REPORT I, Arta Silence, the attending physician, personally viewed and interpreted this ECG.  Date: 05/04/2022 EKG Time: 09 17 Rate: 82 Rhythm: normal sinus rhythm with PVCs QRS Axis: normal Intervals: Borderline prolonged QTc ST/T Wave abnormalities: Nonspecific T wave abnormalities Narrative Interpretation: Nonspecific abnormalities with no evidence of acute ischemia    RADIOLOGY  XR abdomen: I independently viewed and interpreted the images; there is a nonobstructive bowel gas pattern  CT abdomen/pelvis:  IMPRESSION:  Sigmoid diverticulosis without inflammation.     No acute abnormality seen in the abdomen or pelvis.     Aortic Atherosclerosis (ICD10-I70.0).    PROCEDURES:  Critical Care performed: No  Procedures   MEDICATIONS ORDERED IN ED: Medications  potassium chloride 10 mEq in 100 mL IVPB (has no administration in time range)  potassium chloride SA (KLOR-CON M) CR tablet 40  mEq (has no administration in time range)  magnesium sulfate IVPB 2 g 50 mL (has no administration in time range)  ondansetron (ZOFRAN) injection 4 mg (4 mg Intravenous Given 05/04/22 1056)  sodium chloride 0.9 % bolus 500 mL (500 mLs Intravenous New Bag/Given 05/04/22 1309)  morphine (PF) 4 MG/ML injection 4 mg (4 mg Intravenous Given 05/04/22 1305)  potassium chloride 10 mEq in 100 mL IVPB (0 mEq Intravenous Stopped 05/04/22 1437)  iohexol (OMNIPAQUE) 300 MG/ML solution 100 mL (100 mLs Intravenous Contrast Given 05/04/22 1409)  metoCLOPramide (REGLAN) injection 10 mg (10 mg Intravenous Given 05/04/22 1452)     IMPRESSION / MDM / ASSESSMENT AND PLAN / ED COURSE  I reviewed the triage vital signs and the nursing notes.  Patient's presentation is most consistent with acute presentation with potential threat to life or bodily function.   71 year old female with PMH as noted above presents with nausea, dry heaving, decreased appetite, and abdominal pain over the last few days associated with lightheadedness over about 1 week.  On exam the patient is uncomfortable appearing.  Her vital signs are normal except for hypertension and borderline tachypnea.  O2 saturation is 100% on her normal 2 L O2.  Her abdomen is slightly distended and is moderately tender.  Physical exam is otherwise unremarkable for acute findings.  EKG shows borderline prolonged QT but no other significant findings.  I reviewed the past medical records.  The patient was most recently admitted in February 2023 due to syncope and dizziness but also with left sided facial droop and visual field cut.  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  I also confirmed history of a laparoscopic cholecystectomy in 2021.  Differential diagnosis includes, but is not limited to, small bowel obstruction, ileus, gastritis, gastroenteritis, gastroparesis, pancreatitis, other hepatobiliary  etiology, volvulus, colitis, diverticulitis.  The patient denies vertigo symptoms.  Her vital signs do not suggest sepsis.  We will obtain abdominal plain film and then CT, lab work-up, give fluids and analgesia, and reassess.  The patient was given Zofran at triage but I will avoid further QT prolonging agents unless absolutely necessary.  The patient is on the cardiac monitor to evaluate for evidence of arrhythmia and/or significant heart rate changes.  ----------------------------------------- 3:09 PM on 05/04/2022 -----------------------------------------  Abdominal x-ray and CT are both negative for acute findings.  The patient experienced some improvement after morphine although is still nauseous and dry heaving.  We are giving a dose of  Reglan round and slowly to try to help treat the nausea without exacerbating the prolonged QT.    Lab work-up reveals hypokalemia but is otherwise overall reassuring with no leukocytosis, negative lactate, normal LFTs, lipase, and troponin.  However, given the intractable nausea and dry heaving, we will admit for further management.  I suspect the patient may have gastroparesis or gastritis.  I consulted Dr. Blaine Hamper from the hospitalist service; based on her discussion he agrees to admit the patient   FINAL CLINICAL IMPRESSION(S) / ED DIAGNOSES   Final diagnoses:  Nausea  Abdominal pain, unspecified abdominal location  Hypokalemia  Lightheadedness     Rx / DC Orders   ED Discharge Orders     None        Note:  This document was prepared using Dragon voice recognition software and may include unintentional dictation errors.    Arta Silence, MD 05/04/22 1510

## 2022-05-04 NOTE — H&P (Signed)
History and Physical    Morgan [name] GEZ:[id number] DOB: August 02, 1951 DOA: 05/04/2022  Referring MD/NP/PA:   PCP: [name], MD   Patient coming from:  The patient is coming from home.  At baseline, pt is independent for most of ADL.        Chief Complaint: Epigastric abdominal pain, intractable nausea and dry heaves, near syncope  HPI: Morgan [name] is a 71 y.o. female with medical history significant of, PUD, gastritis, duodenitis, hemiplegic migraine with mild left-sided weakness, hypertension, hyperlipidemia, COPD on 3 L oxygen, GERD, depression, anxiety, Parkinson's disease, CHF, anemia, CKD-3A who presents with epigastric abdominal pain, intractable nausea and dry heaves, near syncope.  Patient states that she has severe nausea, dry heaves and epigastric abdominal pain for more than a week.  She states that her nausea and dry heaves are constant and intractable.  Her epigastric abdominal pain is sharp, 8 out of 10 in severity, constant, nonradiating.  No diarrhea.  No fever or chills.  She states that she has lightheadedness and dizziness, feels like she is going to pass out, but did not.  No fall.  No injury.  Denies symptoms of UTI.  Data Reviewed and ED Course: pt was found to have WBC 6.6, lipase 21, liver function normal, troponin 5, potassium 2.8, stable renal function, temperature normal, blood pressure 162/96, heart rate 91, RR 20, oxygen saturation 98% on 3-4 L oxygen, CT of abdomen/pelvis showed sigmoid diverticulosis, otherwise negative for acute findings.  Patient is placed on telemetry bed for observation.  Dr. Allen Norris of GI is consulted.   EKG: I have personally reviewed.  Sinus rhythm, occasional PVC, PAC, QTc 507.   Review of Systems:   General: no fevers, chills, no body weight gain, has poor appetite, has fatigue HEENT: no blurry vision, hearing changes or sore throat Respiratory: no dyspnea, coughing, wheezing CV: no chest pain, no palpitations GI: has nausea,  dry heaves, abdominal pain, no diarrhea, constipation GU: no dysuria, burning on urination, increased urinary frequency, hematuria  Ext: no leg edema Neuro: no vision change or hearing loss.  Has mild left-sided weakness due to hemiplegic migraine Skin: no rash, no skin tear. MSK: No muscle spasm, no deformity, no limitation of range of movement in spin Heme: No easy bruising.  Travel history: No recent long distant travel.   Allergy:  Allergies  Allergen Reactions   Meperidine Hives and Anaphylaxis   Strawberry Extract Hives and Swelling   Sucralfate Nausea Only and Other (See Comments)    Severe nausea Other reaction(s): Other (See Comments) Severe nausea Severe nausea   Wasp Venom Hives    Past Medical History:  Diagnosis Date   Allergy    Anemia    Anxiety    Arthritis    CHF (congestive heart failure) (HCC)    Depression    GERD (gastroesophageal reflux disease)    Hypertension    MVP (mitral valve prolapse)    Parkinson disease ([place])     Past Surgical History:  Procedure Laterality Date   ABDOMINAL HYSTERECTOMY     APPENDECTOMY     BALLOON DILATION N/A 04/21/2020   Procedure: BALLOON DILATION;  Surgeon: Milus Banister, MD;  Location: [facility];  Service: Endoscopy;  Laterality: N/A;  pyloric/ GI   BALLOON DILATION N/A 07/04/2020   Procedure: BALLOON DILATION;  Surgeon: Mauri Pole, MD;  Location: [facility];  Service: Endoscopy;  Laterality: N/A;   BIOPSY  04/21/2020   Procedure: BIOPSY;  Surgeon:  Milus Banister, MD;  Location: [facility];  Service: Endoscopy;;   BIOPSY  07/04/2020   Procedure: BIOPSY;  Surgeon: Mauri Pole, MD;  Location: [facility];  Service: Endoscopy;;  EGD and COLON   BREAST SURGERY Right 1988   tumor removal   CHOLECYSTECTOMY N/A 05/18/2020   Procedure: LAPAROSCOPIC CHOLECYSTECTOMY;  Surgeon: Johnathan Hausen, MD;  Location: [facility];  Service: General;  Laterality: N/A;   COLONOSCOPY WITH PROPOFOL N/A 07/04/2020    Procedure: COLONOSCOPY WITH PROPOFOL;  Surgeon: Mauri Pole, MD;  Location: [facility];  Service: Endoscopy;  Laterality: N/A;   COLONOSCOPY WITH PROPOFOL N/A 11/03/2021   Procedure: COLONOSCOPY WITH PROPOFOL;  Surgeon: Lesly Rubenstein, MD;  Location: [facility];  Service: Endoscopy;  Laterality: N/A;   ESOPHAGEAL DILATION  08/11/2020   Procedure: PYLORIC DILATION;  Surgeon: Doran Stabler, MD;  Location: [facility];  Service: Gastroenterology;;   ESOPHAGOGASTRODUODENOSCOPY (EGD) WITH PROPOFOL N/A 04/21/2020   Procedure: ESOPHAGOGASTRODUODENOSCOPY (EGD) WITH PROPOFOL;  Surgeon: Milus Banister, MD;  Location: [facility];  Service: Endoscopy;  Laterality: N/A;   ESOPHAGOGASTRODUODENOSCOPY (EGD) WITH PROPOFOL N/A 07/04/2020   Procedure: ESOPHAGOGASTRODUODENOSCOPY (EGD) WITH PROPOFOL;  Surgeon: Mauri Pole, MD;  [facility]: [facility];  Service: Endoscopy;  Laterality: N/A;   ESOPHAGOGASTRODUODENOSCOPY (EGD) WITH PROPOFOL N/A 08/11/2020   Procedure: ESOPHAGOGASTRODUODENOSCOPY (EGD) WITH PROPOFOL;  Surgeon: Doran Stabler, MD;  Location: [facility];  Service: Gastroenterology;  Laterality: N/A;   ESOPHAGOGASTRODUODENOSCOPY (EGD) WITH PROPOFOL N/A 11/03/2021   Procedure: ESOPHAGOGASTRODUODENOSCOPY (EGD) WITH PROPOFOL;  Surgeon: Lesly Rubenstein, MD;  Location: [facility];  Service: Endoscopy;  Laterality: N/A;   KNEE SURGERY  2005   2 times left   KNEE SURGERY Right    KYPHOPLASTY N/A 07/20/2021   Procedure: T 10KYPHOPLASTY;  Surgeon: Hessie Knows, MD;  Location: [facility];  Service: Orthopedics;  Laterality: N/A;   KYPHOPLASTY N/A 08/23/2021   Procedure: T9 KYPHOPLASTY;  Surgeon: Hessie Knows, MD;  Location: [facility];  Service: Orthopedics;  Laterality: N/A;   POLYPECTOMY  07/04/2020   Procedure: POLYPECTOMY;  Surgeon: Mauri Pole, MD;  Location: [facility];  Service: Endoscopy;;   TUBAL LIGATION      Social History:  reports that she quit smoking  about 4 years ago. Her smoking use included cigarettes. She has a 15.00 pack-year smoking history. She has never used smokeless tobacco. She reports that she does not currently use alcohol. She reports that she does not use drugs.  Family History:  Family History  Problem Relation Age of Onset   Breast cancer Mother 95   Ovarian cancer Mother 25   Brain cancer Mother    Hypertension Father    Hyperlipidemia Father    Heart disease Father    Colon cancer Father        diagnosed in his 28s   Breast cancer Maternal Grandmother 67   Diabetes Paternal Grandmother    Breast cancer Paternal Grandmother 56   Other Son        drug over dose   Colon cancer Maternal Grandfather        dx in his 33s   Liver disease Maternal Grandfather    Breast cancer Maternal Aunt 60   Breast cancer Maternal Aunt 60   Colon cancer Maternal Uncle    Stomach cancer Neg Hx    Pancreatic cancer Neg Hx    Esophageal cancer Neg Hx      Prior to Admission medications   Medication  Sig Start Date End Date Taking? Authorizing Provider  acetaminophen (TYLENOL) 325 MG tablet Take 2 tablets (650 mg total) by mouth every 4 (four) hours as needed for mild pain (or temp > 37.5 C (99.5 F)). 01/30/22   Sharen Hones, MD  AIMOVIG 140 MG/ML SOAJ SMARTSIG:140 Milligram(s) SUB-Q Once a Month 02/21/22   [provider]  aspirin-acetaminophen-caffeine (EXCEDRIN MIGRAINE) [phone] MG tablet Take 1-2 tablets by mouth every 6 (six) hours as needed for headache.    [provider]  atorvastatin (LIPITOR) 40 MG tablet TAKE 1 TABLET BY MOUTH EVERY DAY 03/21/22   [name], MD  Budeson-Glycopyrrol-Formoterol (BREZTRI AEROSPHERE) 160-9-4.8 MCG/ACT AERO Inhale 2 puffs into the lungs in the morning and at bedtime. 04/13/22   Hunsucker, Bonna Gains, MD  carbidopa-levodopa (SINEMET IR) 25-100 MG tablet Take 2 tablets by mouth 3 (three) times daily. 11/21/21   [provider]  carvedilol (COREG) 12.5 MG tablet Take  6.25 mg by mouth daily. 10/25/21   [provider]  Cholecalciferol 1.25 MG (50000 UT) TABS Take 1 tablet by mouth once a week. 02/20/22   [name], MD  divalproex (DEPAKOTE) 500 MG DR tablet Take 1 tablet (500 mg total) by mouth every 12 (twelve) hours. 01/30/22   Sharen Hones, MD  FLUoxetine (PROZAC) 40 MG capsule Take 40 mg by mouth daily.    [provider]  fluticasone (FLONASE) 50 MCG/ACT nasal spray Place 1 spray into both nostrils daily. 01/30/22   Sharen Hones, MD  furosemide (LASIX) 40 MG tablet Take 1 tablet (40 mg total) by mouth daily. 04/03/22   [name], MD  gabapentin (NEURONTIN) 400 MG capsule Take 400 mg by mouth 3 (three) times daily as needed (for nerve pain). 10/20/18   [provider]  hydrOXYzine (ATARAX/VISTARIL) 25 MG tablet Take 25 mg by mouth every 8 (eight) hours as needed for anxiety. 08/22/21   [provider]  mirtazapine (REMERON) 15 MG tablet Take 15 mg by mouth at bedtime. 10/20/20   [provider]  montelukast (SINGULAIR) 10 MG tablet Take 1 tablet (10 mg total) by mouth at bedtime. 04/13/22   Hunsucker, Bonna Gains, MD  omeprazole (PRILOSEC) 40 MG capsule TAKE 1 CAPSULE BY MOUTH EVERY DAY Patient taking differently: Take 40 mg by mouth daily. 08/14/21   [name], MD  ondansetron (ZOFRAN-ODT) 4 MG disintegrating tablet TAKE 1 TO 2 TABLETS BY MOUTH EVERY 6 HOURS AS NEEDED**PLEASE CONTACT OFFICE TO SCHEDULED FOLLOW UP 03/19/22   Levin Erp, PA  OXYGEN Inhale 3 L/min into the lungs See admin instructions. 3 L/min at bedtime and as needed for shortness of breath throughout the day    [provider]  valsartan (DIOVAN) 320 MG tablet Take 320 mg by mouth daily. 02/16/22   [provider]    Physical Exam: Vitals:   05/04/22 0915 05/04/22 0919 05/04/22 1248  BP:  (!) 149/79 (!) 162/96  Pulse:  78 91  Resp:  20 (!) 30  Temp:  97.7 F (36.5 C)   TempSrc:  Axillary   SpO2:  98% 100%   Weight: 90.7 kg    Height: '6\' 1"'$  (1.854 m)     General: in acute distress due to severe nausea, dry heaves and epigastric abdominal pain HEENT:       Eyes: PERRL, EOMI, no scleral icterus.       ENT: No discharge from the ears and nose, no pharynx injection, no tonsillar enlargement.  Neck: No JVD, no bruit, no mass felt. Heme: No neck lymph node enlargement. Cardiac: S1/S2, RRR, No gallops or rubs. Respiratory: No rales, wheezing, rhonchi or rubs. GI: Soft, nondistended, has tenderness in epigastric area, no rebound pain, no organomegaly, BS present. GU: No hematuria Ext: No pitting leg edema bilaterally. 1+DP/PT pulse bilaterally. Musculoskeletal: No joint deformities, No joint redness or warmth, no limitation of ROM in spin. Skin: No rashes.  Neuro: Alert, oriented X3, cranial nerves II-XII grossly intact, has minimal left-sided weakness Psych: Patient is not psychotic, no suicidal or hemocidal ideation.  Labs on Admission: I have personally reviewed following labs and imaging studies  CBC: Recent Labs  Lab 05/04/22 0922  WBC 6.6  HGB 10.6*  HCT 34.0*  MCV 80.2  PLT 390*   Basic Metabolic Panel: Recent Labs  Lab 05/04/22 0922  NA 136  K 2.8*  CL 94*  CO2 33*  GLUCOSE 126*  BUN 23  CREATININE 1.05*  CALCIUM 8.7*  MG 1.9   GFR: Estimated Creatinine Clearance: 66 mL/min (A) (by C-G formula based on SCr of 1.05 mg/dL (H)). Liver Function Tests: Recent Labs  Lab 05/04/22 1345  AST 18  ALT 9  ALKPHOS 120  BILITOT 0.9  PROT 6.8  ALBUMIN 3.1*   Recent Labs  Lab 05/04/22 1345  LIPASE 21   No results for input(s): AMMONIA in the last 168 hours. Coagulation Profile: No results for input(s): INR, PROTIME in the last 168 hours. Cardiac Enzymes: No results for input(s): CKTOTAL, CKMB, CKMBINDEX, TROPONINI in the last 168 hours. BNP (last 3 results) Recent Labs    08/29/21 0931  PROBNP 180.0*   HbA1C: No results for input(s): HGBA1C in the last  72 hours. CBG: No results for input(s): GLUCAP in the last 168 hours. Lipid Profile: No results for input(s): CHOL, HDL, LDLCALC, TRIG, CHOLHDL, LDLDIRECT in the last 72 hours. Thyroid Function Tests: No results for input(s): TSH, T4TOTAL, FREET4, T3FREE, THYROIDAB in the last 72 hours. Anemia Panel: No results for input(s): VITAMINB12, FOLATE, FERRITIN, TIBC, IRON, RETICCTPCT in the last 72 hours. Urine analysis:    Component Value Date/Time   COLORURINE YELLOW 01/18/2022 1250   APPEARANCEUR CLEAR 01/18/2022 1250   APPEARANCEUR Cloudy 09/18/2012 2000   LABSPEC <1.005 (L) 01/18/2022 1250   LABSPEC 1.012 09/18/2012 2000   PHURINE 5.0 01/18/2022 1250   GLUCOSEU NEGATIVE 01/18/2022 1250   GLUCOSEU Negative 09/18/2012 2000   HGBUR NEGATIVE 01/18/2022 1250   BILIRUBINUR NEGATIVE 01/18/2022 1250   BILIRUBINUR negative 10/29/2021 1110   BILIRUBINUR negative 04/08/2019 1103   BILIRUBINUR Negative 09/18/2012 2000   KETONESUR NEGATIVE 01/18/2022 1250   PROTEINUR NEGATIVE 01/18/2022 1250   UROBILINOGEN 0.2 10/29/2021 1110   UROBILINOGEN 0.2 05/25/2017 1743   NITRITE NEGATIVE 01/18/2022 1250   LEUKOCYTESUR SMALL (A) 01/18/2022 1250   LEUKOCYTESUR Trace 09/18/2012 2000   Sepsis Labs: '@LABRCNTIP'$ (procalcitonin:4,lacticidven:4) )No results found for this or any previous visit (from the past 240 hour(s)).   Radiological Exams on Admission: CT ABDOMEN PELVIS W CONTRAST  Result Date: 05/04/2022 CLINICAL DATA:  Abdominal pain, vomiting. EXAM: CT ABDOMEN AND PELVIS WITH CONTRAST TECHNIQUE: Multidetector CT imaging of the abdomen and pelvis was performed using the standard protocol following bolus administration of intravenous contrast. RADIATION DOSE REDUCTION: This exam was performed according to the departmental dose-optimization program which includes automated exposure control, adjustment of the mA and/or kV according to patient size and/or use of iterative reconstruction technique. CONTRAST:   1101m OMNIPAQUE IOHEXOL 300 MG/ML  SOLN  COMPARISON:  January 18, 2022. FINDINGS: Lower chest: No acute abnormality. Hepatobiliary: No focal liver abnormality is seen. Status post cholecystectomy. Mild intrahepatic and extrahepatic biliary dilatation is noted most likely due to post cholecystectomy status. Pancreas: Unremarkable. No pancreatic ductal dilatation or surrounding inflammatory changes. Spleen: Normal in size without focal abnormality. Adrenals/Urinary Tract: Adrenal glands are unremarkable. Kidneys are normal, without renal calculi, focal lesion, or hydronephrosis. Bladder is unremarkable. Stomach/Bowel: The stomach appears normal. There is no evidence of bowel obstruction or inflammation. Status post appendectomy. Sigmoid diverticulosis without inflammation. Vascular/Lymphatic: Aortic atherosclerosis. No enlarged abdominal or pelvic lymph nodes. Reproductive: Status post hysterectomy. No adnexal masses. Other: No abdominal wall hernia or abnormality. No abdominopelvic ascites. Musculoskeletal: Status post T8-T9 kyphoplasty. No acute abnormality seen. IMPRESSION: Sigmoid diverticulosis without inflammation. No acute abnormality seen in the abdomen or pelvis. Aortic Atherosclerosis (ICD10-I70.0). Electronically Signed   By: Marijo Conception M.D.   On: 05/04/2022 14:31   DG Abd Portable 1 View  Result Date: 05/04/2022 CLINICAL DATA:  Possible small bowel obstruction. Abdominal pain and dry heaving beginning today. EXAM: PORTABLE ABDOMEN - 1 VIEW COMPARISON:  08/10/2020 FINDINGS: Bowel gas pattern is nonobstructive. Air and mild stool present throughout the colon. No dilated small bowel loops. No free peritoneal air. Previous kyphoplasties of T9 and T10. Several pelvic phleboliths. IMPRESSION: Nonobstructive bowel gas pattern. Electronically Signed   By: Marin Olp M.D.   On: 05/04/2022 13:44      Assessment/Plan Principal Problem:   Nausea & vomiting Active Problems:   Epigastric abdominal  pain   Hypokalemia   Essential hypertension   Depression   HLD (hyperlipidemia)   GERD (gastroesophageal reflux disease)   Chronic diastolic CHF (congestive heart failure) (HCC)   COPD (chronic obstructive pulmonary disease) (HCC)   Stage 3a chronic kidney disease (HCC)   Hemiplegic migraine   Parkinson disease ([place])   Near syncope   Normocytic anemia   Principal Problem:   Nausea & vomiting Active Problems:   Epigastric abdominal pain   Hypokalemia   Essential hypertension   Depression   HLD (hyperlipidemia)   GERD (gastroesophageal reflux disease)   Chronic diastolic CHF (congestive heart failure) (HCC)   COPD (chronic obstructive pulmonary disease) (HCC)   Stage 3a chronic kidney disease (HCC)   Hemiplegic migraine   Parkinson disease (HCC)   Near syncope   Normocytic anemia   Assessment and Plan: * Nausea & vomiting Patient has intractable nausea, vomiting, epigastric abdominal pain.  Mostly dry heaves.  Etiology is not clear.  CT scan of abdomen/pelvis is negative for acute findings.  Patient has history of GERD, gastritis, duodenitis, peptic ulcer disease, which are likely the underlying reason.  Consulted with Dr. Verl Blalock for GI -Placed on telemetry bed for observation -Protonix 40 mg twice daily -As needed morphine for pain -As needed Benadryl for nausea vomiting (patient has prolonged QTc, cannot use Zofran) -IV fluid: 500 cc normal saline, then 75 cc/h of normal saline  Epigastric abdominal pain - See above  Normocytic anemia Hemoglobin stable, 10.6 today (10.3 on 04/27/2022) -Follow-up by CBC  Near syncope Likely due to dehydration and orthostatic status secondary to nausea vomiting and decreased oral intake -Frequent neurochecks -Fall precaution -Check orthostatic vital sign -IV fluid as above  Parkinson disease (HCC) -Sinemet  Hemiplegic migraine Depakote was discontinued by her doctor -As needed Excedrin -Patient is also on monthly  Aimorig  Stage 3a chronic kidney disease ([place]) Stable.  Creatinine 1.05, BUN 23 -Follow-up with BMP  COPD (  chronic obstructive pulmonary disease) (HCC) Stable, on 3 L oxygen at home, currently oxygen saturation 98% on 2-4 L oxygen. -Bronchodilators  Chronic diastolic CHF (congestive heart failure) (HCC) 2D echo on 01/19/2022 showed EF of 60 to 65% with grade 1 diastolic dysfunction.  Patient does not have leg edema or JVD.  CHF seem to be compensated. -Hold Lasix since patient need IV fluid -Check BNP  GERD (gastroesophageal reflux disease) - On IV Protonix  HLD (hyperlipidemia) - Lipitor  Depression - Hold Prozac and Remeron due to QTc prolongation -continue hydroxyzine  Essential hypertension Patient is not taking Coreg and Diovan currently - IV hydralazine as needed   Hypokalemia Potassium 2.8 -Repleted potassium -Give 1 g of magnesium sulfate -Check magnesium level             DVT ppx: sQ Lovenox  Code Status: Full code (I discussed with patient about CODE STATUS, she initially said she is DNR, but when I asked if she wants CPR or intubation, she said yes for CPR and intubation, therefore she is full code).  Family Communication: Yes, patient's aunt by phone  Disposition Plan:  Anticipate discharge back to previous environment  Consults called:  Dr. Allen Norris of GI.  Admission status and Level of care: Telemetry Cardiac:    Med-surg bed for obs as inpt    progressive unit for obs   as inpt      SDU/inpation         Severity of Illness:  The appropriate patient status for this patient is OBSERVATION. Observation status is judged to be reasonable and necessary in order to provide the required intensity of service to ensure the patient's safety. The patient's presenting symptoms, physical exam findings, and initial radiographic and laboratory data in the context of their medical condition is felt to place them at decreased risk for further clinical  deterioration. Furthermore, it is anticipated that the patient will be medically stable for discharge from the hospital within 2 midnights of admission.        Date of Service 05/04/2022    [facility]   If 7PM-7AM, please contact night-coverage [email or web address] 05/04/2022, 4:30 PM

## 2022-05-04 NOTE — Assessment & Plan Note (Signed)
Patient has intractable nausea, vomiting, epigastric abdominal pain.  Mostly dry heaves.  Etiology is not clear.  CT scan of abdomen/pelvis is negative for acute findings.  Patient has history of GERD, gastritis, duodenitis, peptic ulcer disease, which are likely the underlying reason.  Consulted with Dr. Verl Blalock for GI -Placed on telemetry bed for observation -Protonix 40 mg twice daily -As needed morphine for pain -As needed Benadryl for nausea vomiting (patient has prolonged QTc, cannot use Zofran) -IV fluid: 500 cc normal saline, then 75 cc/h of normal saline

## 2022-05-04 NOTE — Assessment & Plan Note (Addendum)
Patient is not taking Coreg and Diovan currently - IV hydralazine as needed

## 2022-05-04 NOTE — Assessment & Plan Note (Signed)
Stable.  Creatinine 1.05, BUN 23 -Follow-up with BMP

## 2022-05-04 NOTE — Assessment & Plan Note (Addendum)
Depakote was discontinued by her doctor -As needed Excedrin -Patient is also on monthly Aimorig

## 2022-05-04 NOTE — Assessment & Plan Note (Signed)
Potassium 2.8 -Repleted potassium -Give 1 g of magnesium sulfate -Check magnesium level

## 2022-05-04 NOTE — Assessment & Plan Note (Signed)
-   On IV Protonix 

## 2022-05-04 NOTE — Assessment & Plan Note (Signed)
Likely due to dehydration and orthostatic status secondary to nausea vomiting and decreased oral intake -Frequent neurochecks -Fall precaution -Check orthostatic vital sign -IV fluid as above

## 2022-05-04 NOTE — Assessment & Plan Note (Signed)
See above

## 2022-05-04 NOTE — Assessment & Plan Note (Signed)
Hemoglobin stable, 10.6 today (10.3 on 04/27/2022) -Follow-up by CBC

## 2022-05-04 NOTE — ED Triage Notes (Signed)
Pt via [facility] from home. Pt was sent for a near syncopal episode. Per First Nurse note, pt has had multiple episode like theses for months. Pt is on O2 chronically 2L [place]. Pt is c/o abd pain and dry heaving that started today. Denies any CP/SOB. Pt is A&Ox4 and NAD.

## 2022-05-05 DIAGNOSIS — K219 Gastro-esophageal reflux disease without esophagitis: Secondary | ICD-10-CM | POA: Diagnosis not present

## 2022-05-05 DIAGNOSIS — Z91030 Bee allergy status: Secondary | ICD-10-CM | POA: Diagnosis not present

## 2022-05-05 DIAGNOSIS — Z87891 Personal history of nicotine dependence: Secondary | ICD-10-CM | POA: Diagnosis not present

## 2022-05-05 DIAGNOSIS — Z9981 Dependence on supplemental oxygen: Secondary | ICD-10-CM | POA: Diagnosis not present

## 2022-05-05 DIAGNOSIS — K319 Disease of stomach and duodenum, unspecified: Secondary | ICD-10-CM | POA: Diagnosis not present

## 2022-05-05 DIAGNOSIS — R112 Nausea with vomiting, unspecified: Secondary | ICD-10-CM | POA: Diagnosis present

## 2022-05-05 DIAGNOSIS — G20 Parkinson's disease: Secondary | ICD-10-CM | POA: Diagnosis not present

## 2022-05-05 DIAGNOSIS — M6281 Muscle weakness (generalized): Secondary | ICD-10-CM | POA: Diagnosis not present

## 2022-05-05 DIAGNOSIS — D75839 Thrombocytosis, unspecified: Secondary | ICD-10-CM | POA: Diagnosis not present

## 2022-05-05 DIAGNOSIS — R69 Illness, unspecified: Secondary | ICD-10-CM | POA: Diagnosis not present

## 2022-05-05 DIAGNOSIS — R110 Nausea: Secondary | ICD-10-CM | POA: Diagnosis not present

## 2022-05-05 DIAGNOSIS — J449 Chronic obstructive pulmonary disease, unspecified: Secondary | ICD-10-CM | POA: Diagnosis not present

## 2022-05-05 DIAGNOSIS — I10 Essential (primary) hypertension: Secondary | ICD-10-CM | POA: Diagnosis not present

## 2022-05-05 DIAGNOSIS — Z9102 Food additives allergy status: Secondary | ICD-10-CM | POA: Diagnosis not present

## 2022-05-05 DIAGNOSIS — Z888 Allergy status to other drugs, medicaments and biological substances status: Secondary | ICD-10-CM | POA: Diagnosis not present

## 2022-05-05 DIAGNOSIS — G43409 Hemiplegic migraine, not intractable, without status migrainosus: Secondary | ICD-10-CM | POA: Diagnosis not present

## 2022-05-05 DIAGNOSIS — R1084 Generalized abdominal pain: Secondary | ICD-10-CM

## 2022-05-05 DIAGNOSIS — F419 Anxiety disorder, unspecified: Secondary | ICD-10-CM | POA: Diagnosis not present

## 2022-05-05 DIAGNOSIS — K5730 Diverticulosis of large intestine without perforation or abscess without bleeding: Secondary | ICD-10-CM | POA: Diagnosis not present

## 2022-05-05 DIAGNOSIS — L539 Erythematous condition, unspecified: Secondary | ICD-10-CM | POA: Diagnosis not present

## 2022-05-05 DIAGNOSIS — E785 Hyperlipidemia, unspecified: Secondary | ICD-10-CM | POA: Diagnosis not present

## 2022-05-05 DIAGNOSIS — K2970 Gastritis, unspecified, without bleeding: Secondary | ICD-10-CM | POA: Diagnosis not present

## 2022-05-05 DIAGNOSIS — Z8711 Personal history of peptic ulcer disease: Secondary | ICD-10-CM | POA: Diagnosis not present

## 2022-05-05 DIAGNOSIS — N1831 Chronic kidney disease, stage 3a: Secondary | ICD-10-CM | POA: Diagnosis not present

## 2022-05-05 DIAGNOSIS — J9610 Chronic respiratory failure, unspecified whether with hypoxia or hypercapnia: Secondary | ICD-10-CM | POA: Diagnosis not present

## 2022-05-05 DIAGNOSIS — I5032 Chronic diastolic (congestive) heart failure: Secondary | ICD-10-CM | POA: Diagnosis not present

## 2022-05-05 DIAGNOSIS — R109 Unspecified abdominal pain: Secondary | ICD-10-CM

## 2022-05-05 DIAGNOSIS — E876 Hypokalemia: Secondary | ICD-10-CM | POA: Diagnosis not present

## 2022-05-05 DIAGNOSIS — F32A Depression, unspecified: Secondary | ICD-10-CM | POA: Diagnosis not present

## 2022-05-05 DIAGNOSIS — Z8249 Family history of ischemic heart disease and other diseases of the circulatory system: Secondary | ICD-10-CM | POA: Diagnosis not present

## 2022-05-05 DIAGNOSIS — R55 Syncope and collapse: Secondary | ICD-10-CM | POA: Diagnosis not present

## 2022-05-05 DIAGNOSIS — I130 Hypertensive heart and chronic kidney disease with heart failure and stage 1 through stage 4 chronic kidney disease, or unspecified chronic kidney disease: Secondary | ICD-10-CM | POA: Diagnosis not present

## 2022-05-05 DIAGNOSIS — D649 Anemia, unspecified: Secondary | ICD-10-CM | POA: Diagnosis not present

## 2022-05-05 DIAGNOSIS — R1013 Epigastric pain: Secondary | ICD-10-CM | POA: Diagnosis not present

## 2022-05-05 DIAGNOSIS — E860 Dehydration: Secondary | ICD-10-CM | POA: Diagnosis not present

## 2022-05-05 LAB — BASIC METABOLIC PANEL
Anion gap: 6 (ref 5–15)
BUN: 21 mg/dL (ref 8–23)
CO2: 33 mmol/L — ABNORMAL HIGH (ref 22–32)
Calcium: 8.5 mg/dL — ABNORMAL LOW (ref 8.9–10.3)
Chloride: 98 mmol/L (ref 98–111)
Creatinine, Ser: 0.91 mg/dL (ref 0.44–1.00)
GFR, Estimated: >60 mL/min (ref >60)
Glucose, Bld: 113 mg/dL — ABNORMAL HIGH (ref 70–99)
Potassium: 3.4 mmol/L — ABNORMAL LOW (ref 3.5–5.1)
Sodium: 137 mmol/L (ref 135–145)

## 2022-05-05 MED ORDER — BISACODYL 10 MG RE SUPP
10.0000 mg | Freq: Every day | RECTAL | Status: DC | PRN
  Administered 2022-05-05: 10 mg via RECTAL
  Filled 2022-05-05: qty 1

## 2022-05-05 MED ORDER — OXYCODONE-ACETAMINOPHEN 5-325 MG PO TABS
1.0000 | ORAL_TABLET | Freq: Four times a day (QID) | ORAL | Status: DC | PRN
  Administered 2022-05-07 – 2022-05-10 (×7): 1 via ORAL
  Filled 2022-05-05 (×10): qty 1

## 2022-05-05 MED ORDER — METOCLOPRAMIDE HCL 5 MG/ML IJ SOLN
10.0000 mg | Freq: Three times a day (TID) | INTRAMUSCULAR | Status: DC | PRN
  Administered 2022-05-05 – 2022-05-07 (×5): 10 mg via INTRAVENOUS
  Filled 2022-05-05 (×5): qty 2

## 2022-05-05 MED ORDER — DOCUSATE SODIUM 100 MG PO CAPS
200.0000 mg | ORAL_CAPSULE | Freq: Two times a day (BID) | ORAL | Status: DC | PRN
  Administered 2022-05-05: 200 mg via ORAL
  Filled 2022-05-05: qty 2

## 2022-05-05 MED ORDER — SCOPOLAMINE 1 MG/3DAYS TD PT72
1.0000 | MEDICATED_PATCH | TRANSDERMAL | Status: DC
  Administered 2022-05-05 – 2022-05-08 (×2): 1.5 mg via TRANSDERMAL
  Filled 2022-05-05 (×2): qty 1

## 2022-05-05 NOTE — Progress Notes (Signed)
PROGRESS NOTE    Morgan [name]  DGL:[id number] DOB: December 28, 1950 DOA: 05/04/2022 PCP: [name], MD   Assessment & Plan:   Principal Problem:   Nausea & vomiting Active Problems:   Epigastric abdominal pain   Hypokalemia   Essential hypertension   Depression   HLD (hyperlipidemia)   GERD (gastroesophageal reflux disease)   Chronic diastolic CHF (congestive heart failure) (HCC)   COPD (chronic obstructive pulmonary disease) (HCC)   Stage 3a chronic kidney disease (HCC)   Hemiplegic migraine   Parkinson disease ([place])   Near syncope   Normocytic anemia  Assessment and Plan: Nausea & vomiting: etiology unclear, CT abd/pelvis is neg for any acute findings. Benadryl prn for nausea/vomiting as pt has prolonged QTc so cant use zofran currently. Reglan prn. Started scopolamine patch. GI consulted   Normocytic anemia: H&H are labile. No need for a transfusion currently    Near syncope: likely secondary to dehydration & orthostatic. Continue on IVFs    Parkinson disease: continue on home dose of sinemet    Hemiplegic migraine: excedrin prn. Depakote was d/c by pt's physician    CKDIIIa: Cr is trending down from day prior    COPD: w/o exacerbation. Continue on supplemental oxygen, uses 3L [place] at home   Chronic diastolic CHF: echo on 04/16/8840 showed EF of 60 to 65% with grade 1 diastolic dysfunction.  Patient does not have leg edema or JVD.  CHF appears compensated. Hold lasix    GERD: continue on PPI    HLD: continue on statin    Depression: severity unknown. Hold prozac and remeron due to QTc prolongation   HTN: BP is currently WNL. IV hydralazine prn    Hypokalemia: KCl repleated   Thrombocytosis: etiology unclear. Will continue to monitor      DVT prophylaxis: lovenox  Code Status: full  Family Communication: discussed pt's care w/ pt's family at bedside and answered their questions  Disposition Plan: unclear   Level of care: Telemetry Cardiac  Status is:  Inpatient Remains inpatient appropriate because: severity of illness     Consultants:  GI   Procedures:   Antimicrobials:    Subjective: Pt c/o dry heaves & abd pain   Objective: Vitals:   05/04/22 1658 05/04/22 1936 05/04/22 2314 05/05/22 0400  BP: (!) 142/82 121/78 115/78 134/80  Pulse: 81 73 74 78  Resp: '20 15 15 15  '$ Temp: 98 F (36.7 C) 98.5 F (36.9 C) 98 F (36.7 C) 98.2 F (36.8 C)  TempSrc:      SpO2: 100% 98% 99% 100%  Weight:      Height:        Intake/Output Summary (Last 24 hours) at 05/05/2022 0738 Last data filed at 05/05/2022 0527 Gross per 24 hour  Intake 1968.78 ml  Output 200 ml  Net 1768.78 ml   Filed Weights   05/04/22 0915  Weight: 90.7 kg    Examination:  General exam: Appears uncomfortable  Respiratory system: diminished breath sounds b/l  Cardiovascular system: S1 & S2 +. No rubs, gallops or clicks. Gastrointestinal system: Abdomen is nondistended, soft and tenderness to palpation.  Normal bowel sounds heard. Central nervous system: Alert and oriented. Moves all extremities  Psychiatry: Judgement and insight appear normal. Flat mood and affect     Data Reviewed: I have personally reviewed following labs and imaging studies  CBC: Recent Labs  Lab 05/04/22 0922  WBC 6.6  HGB 10.6*  HCT 34.0*  MCV 80.2  PLT 407*  Basic Metabolic Panel: Recent Labs  Lab 05/04/22 0922 05/05/22 0448  NA 136 137  K 2.8* 3.4*  CL 94* 98  CO2 33* 33*  GLUCOSE 126* 113*  BUN 23 21  CREATININE 1.05* 0.91  CALCIUM 8.7* 8.5*  MG 1.9  --    GFR: Estimated Creatinine Clearance: 76.1 mL/min (by C-G formula based on SCr of 0.91 mg/dL). Liver Function Tests: Recent Labs  Lab 05/04/22 1345  AST 18  ALT 9  ALKPHOS 120  BILITOT 0.9  PROT 6.8  ALBUMIN 3.1*   Recent Labs  Lab 05/04/22 1345  LIPASE 21   No results for input(s): AMMONIA in the last 168 hours. Coagulation Profile: No results for input(s): INR, PROTIME in the last 168  hours. Cardiac Enzymes: No results for input(s): CKTOTAL, CKMB, CKMBINDEX, TROPONINI in the last 168 hours. BNP (last 3 results) Recent Labs    08/29/21 0931  PROBNP 180.0*   HbA1C: No results for input(s): HGBA1C in the last 72 hours. CBG: No results for input(s): GLUCAP in the last 168 hours. Lipid Profile: No results for input(s): CHOL, HDL, LDLCALC, TRIG, CHOLHDL, LDLDIRECT in the last 72 hours. Thyroid Function Tests: No results for input(s): TSH, T4TOTAL, FREET4, T3FREE, THYROIDAB in the last 72 hours. Anemia Panel: No results for input(s): VITAMINB12, FOLATE, FERRITIN, TIBC, IRON, RETICCTPCT in the last 72 hours. Sepsis Labs: Recent Labs  Lab 05/04/22 1345  LATICACIDVEN 1.4    No results found for this or any previous visit (from the past 240 hour(s)).       Radiology Studies: CT ABDOMEN PELVIS W CONTRAST  Result Date: 05/04/2022 CLINICAL DATA:  Abdominal pain, vomiting. EXAM: CT ABDOMEN AND PELVIS WITH CONTRAST TECHNIQUE: Multidetector CT imaging of the abdomen and pelvis was performed using the standard protocol following bolus administration of intravenous contrast. RADIATION DOSE REDUCTION: This exam was performed according to the departmental dose-optimization program which includes automated exposure control, adjustment of the mA and/or kV according to patient size and/or use of iterative reconstruction technique. CONTRAST:  163m OMNIPAQUE IOHEXOL 300 MG/ML  SOLN COMPARISON:  January 18, 2022. FINDINGS: Lower chest: No acute abnormality. Hepatobiliary: No focal liver abnormality is seen. Status post cholecystectomy. Mild intrahepatic and extrahepatic biliary dilatation is noted most likely due to post cholecystectomy status. Pancreas: Unremarkable. No pancreatic ductal dilatation or surrounding inflammatory changes. Spleen: Normal in size without focal abnormality. Adrenals/Urinary Tract: Adrenal glands are unremarkable. Kidneys are normal, without renal calculi,  focal lesion, or hydronephrosis. Bladder is unremarkable. Stomach/Bowel: The stomach appears normal. There is no evidence of bowel obstruction or inflammation. Status post appendectomy. Sigmoid diverticulosis without inflammation. Vascular/Lymphatic: Aortic atherosclerosis. No enlarged abdominal or pelvic lymph nodes. Reproductive: Status post hysterectomy. No adnexal masses. Other: No abdominal wall hernia or abnormality. No abdominopelvic ascites. Musculoskeletal: Status post T8-T9 kyphoplasty. No acute abnormality seen. IMPRESSION: Sigmoid diverticulosis without inflammation. No acute abnormality seen in the abdomen or pelvis. Aortic Atherosclerosis (ICD10-I70.0). Electronically Signed   By: JMarijo ConceptionM.D.   On: 05/04/2022 14:31   DG Abd Portable 1 View  Result Date: 05/04/2022 CLINICAL DATA:  Possible small bowel obstruction. Abdominal pain and dry heaving beginning today. EXAM: PORTABLE ABDOMEN - 1 VIEW COMPARISON:  08/10/2020 FINDINGS: Bowel gas pattern is nonobstructive. Air and mild stool present throughout the colon. No dilated small bowel loops. No free peritoneal air. Previous kyphoplasties of T9 and T10. Several pelvic phleboliths. IMPRESSION: Nonobstructive bowel gas pattern. Electronically Signed   By: DMarin OlpM.D.  On: 05/04/2022 13:44        Scheduled Meds:  atorvastatin  40 mg Oral Daily   carbidopa-levodopa  2 tablet Oral TID   enoxaparin (LOVENOX) injection  40 mg Subcutaneous Q24H   fluticasone furoate-vilanterol  1 puff Inhalation Daily   pantoprazole (PROTONIX) IV  40 mg Intravenous Q12H   umeclidinium bromide  1 puff Inhalation Daily   Continuous Infusions:  sodium chloride 75 mL/hr at 05/05/22 0631     LOS: 0 days    Time spent: 35 mins     Wyvonnia Dusky, MD [facility] Pager [phone]  If 7PM-7AM, please contact night-coverage 05/05/2022, 7:38 AM

## 2022-05-05 NOTE — Consult Note (Signed)
Morgan Lame, MD [facility]  [address] [place], [place] [address] Phone: [phone] Fax : [phone]  Consultation  Referring Provider:     Dr. Blaine Hamper Primary Care Physician:  [name], MD Primary Gastroenterologist:  Dr. Haig Prophet         Reason for Consultation:     Nausea and vomiting  Date of Admission:  05/04/2022 Date of Consultation:  05/05/2022         HPI:   Morgan [name] is a 71 y.o. female who came in with nausea vomiting that she states had been going on for 4 to 5 days.  The patient has a history of having multiple upper endoscopies in the past by multiple gastroenterologists in [place] in [place].  The patient's last upper endoscopy was by Dr. Haig Prophet and that was done in November 2022 at the same time she had a colonoscopy.  The patient had a upper endoscopy in August 2021 by Dr. Sharla Kidney, a month prior to that by Dr. Silverio Decamp in 2 months prior to that by Dr. Ardis Hughs. Her last colonoscopy was done for dysphagia and Dr. Haig Prophet reported that there is no sign of any cause for her dysphagia at that time. The patient is now having nausea and vomiting to both liquids and solids.  She also reports that she has been having dry heaving with no production of any vomitus.  She feels like this is the way she presented in the past with her pyloric stenosis.  Since being in the hospital the patient reports that she has not had any more vomiting but some dry heaving and she has been able to tolerate liquids which she was not able to do at home. In addition to the nausea and vomiting with dry heaves the patient has also been having abdominal pain.  The patient has multiple medical issues including a history of peptic ulcer disease, gastritis, duodenitis, migraines with left-sided weakness, hypertension,, hyperlipidemia, COPD, GERD, anxiety, Parkinson disease, CHF and chronic kidney disease. Note the patient's labs yesterday will explain her symptoms.  The patient had a CT scan  of the abdomen yesterday that showed sigmoid diverticulosis without inflammation and no acute abnormality seen in the abdomen or pelvis.  There was aortic atherosclerosis noted.  Past Medical History:  Diagnosis Date   Allergy    Anemia    Anxiety    Arthritis    CHF (congestive heart failure) (HCC)    Depression    GERD (gastroesophageal reflux disease)    Hypertension    MVP (mitral valve prolapse)    Parkinson disease ([place])     Past Surgical History:  Procedure Laterality Date   ABDOMINAL HYSTERECTOMY     APPENDECTOMY     BALLOON DILATION N/A 04/21/2020   Procedure: BALLOON DILATION;  Surgeon: Milus Banister, MD;  Location: [facility];  Service: Endoscopy;  Laterality: N/A;  pyloric/ GI   BALLOON DILATION N/A 07/04/2020   Procedure: BALLOON DILATION;  Surgeon: Mauri Pole, MD;  Location: [facility];  Service: Endoscopy;  Laterality: N/A;   BIOPSY  04/21/2020   Procedure: BIOPSY;  Surgeon: Milus Banister, MD;  Location: [facility];  Service: Endoscopy;;   BIOPSY  07/04/2020   Procedure: BIOPSY;  Surgeon: Mauri Pole, MD;  Location: [facility];  Service: Endoscopy;;  EGD and COLON   BREAST SURGERY Right 1988   tumor removal   CHOLECYSTECTOMY N/A 05/18/2020   Procedure: LAPAROSCOPIC CHOLECYSTECTOMY;  Surgeon: Johnathan Hausen, MD;  Location: [facility];  Service: General;  Laterality: N/A;   COLONOSCOPY WITH PROPOFOL N/A 07/04/2020   Procedure: COLONOSCOPY WITH PROPOFOL;  Surgeon: Mauri Pole, MD;  Location: [facility];  Service: Endoscopy;  Laterality: N/A;   COLONOSCOPY WITH PROPOFOL N/A 11/03/2021   Procedure: COLONOSCOPY WITH PROPOFOL;  Surgeon: Lesly Rubenstein, MD;  Location: [facility];  Service: Endoscopy;  Laterality: N/A;   ESOPHAGEAL DILATION  08/11/2020   Procedure: PYLORIC DILATION;  Surgeon: Doran Stabler, MD;  Location: [facility];  Service: Gastroenterology;;   ESOPHAGOGASTRODUODENOSCOPY (EGD) WITH PROPOFOL N/A 04/21/2020    Procedure: ESOPHAGOGASTRODUODENOSCOPY (EGD) WITH PROPOFOL;  Surgeon: Milus Banister, MD;  [facility]: [facility];  Service: Endoscopy;  Laterality: N/A;   ESOPHAGOGASTRODUODENOSCOPY (EGD) WITH PROPOFOL N/A 07/04/2020   Procedure: ESOPHAGOGASTRODUODENOSCOPY (EGD) WITH PROPOFOL;  Surgeon: Mauri Pole, MD;  [facility]: [facility];  Service: Endoscopy;  Laterality: N/A;   ESOPHAGOGASTRODUODENOSCOPY (EGD) WITH PROPOFOL N/A 08/11/2020   Procedure: ESOPHAGOGASTRODUODENOSCOPY (EGD) WITH PROPOFOL;  Surgeon: Doran Stabler, MD;  [facility]: [facility];  Service: Gastroenterology;  Laterality: N/A;   ESOPHAGOGASTRODUODENOSCOPY (EGD) WITH PROPOFOL N/A 11/03/2021   Procedure: ESOPHAGOGASTRODUODENOSCOPY (EGD) WITH PROPOFOL;  Surgeon: Lesly Rubenstein, MD;  Location: [facility];  Service: Endoscopy;  Laterality: N/A;   KNEE SURGERY  2005   2 times left   KNEE SURGERY Right    KYPHOPLASTY N/A 07/20/2021   Procedure: T 10KYPHOPLASTY;  Surgeon: Hessie Knows, MD;  Location: [facility];  Service: Orthopedics;  Laterality: N/A;   KYPHOPLASTY N/A 08/23/2021   Procedure: T9 KYPHOPLASTY;  Surgeon: Hessie Knows, MD;  Location: [facility];  Service: Orthopedics;  Laterality: N/A;   POLYPECTOMY  07/04/2020   Procedure: POLYPECTOMY;  Surgeon: Mauri Pole, MD;  [facility]: [facility];  Service: Endoscopy;;   TUBAL LIGATION      Prior to Admission medications   Medication Sig Start Date End Date Taking? Authorizing Provider  acetaminophen (TYLENOL) 325 MG tablet Take 2 tablets (650 mg total) by mouth every 4 (four) hours as needed for mild pain (or temp > 37.5 C (99.5 F)). 01/30/22  Yes Sharen Hones, MD  atorvastatin (LIPITOR) 40 MG tablet TAKE 1 TABLET BY MOUTH EVERY DAY 03/21/22  Yes [name], MD  Budeson-Glycopyrrol-Formoterol (BREZTRI AEROSPHERE) 160-9-4.8 MCG/ACT AERO Inhale 2 puffs into the lungs in the morning and at bedtime. 04/13/22  Yes Hunsucker, Bonna Gains, MD  carbidopa-levodopa (SINEMET  IR) 25-100 MG tablet Take 2 tablets by mouth 3 (three) times daily. 11/21/21  Yes [provider]  fluticasone (FLONASE) 50 MCG/ACT nasal spray Place 1 spray into both nostrils daily. 01/30/22  Yes Sharen Hones, MD  hydrOXYzine (ATARAX/VISTARIL) 25 MG tablet Take 25 mg by mouth every 8 (eight) hours as needed for anxiety. 08/22/21  Yes [provider]  mirtazapine (REMERON) 15 MG tablet Take 15 mg by mouth at bedtime. 10/20/20  Yes [provider]  AIMOVIG 140 MG/ML SOAJ SMARTSIG:140 Milligram(s) SUB-Q Once a Month 02/21/22   [provider]  aspirin-acetaminophen-caffeine (EXCEDRIN MIGRAINE) [phone] MG tablet Take 1-2 tablets by mouth every 6 (six) hours as needed for headache.    [provider]  carvedilol (COREG) 12.5 MG tablet Take 6.25 mg by mouth daily. Patient not taking: Reported on 05/04/2022 10/25/21   [provider]  Cholecalciferol 1.25 MG (50000 UT) TABS Take 1 tablet by mouth once a week. Patient not taking: Reported on 05/04/2022 02/20/22   [name], MD  divalproex (DEPAKOTE) 500 MG  DR tablet Take 1 tablet (500 mg total) by mouth every 12 (twelve) hours. Patient not taking: Reported on 05/04/2022 01/30/22   Sharen Hones, MD  FLUoxetine (PROZAC) 40 MG capsule Take 40 mg by mouth daily. Patient not taking: Reported on 05/04/2022    [provider]  furosemide (LASIX) 40 MG tablet Take 1 tablet (40 mg total) by mouth daily. Patient not taking: Reported on 05/04/2022 04/03/22   [name], MD  gabapentin (NEURONTIN) 400 MG capsule Take 400 mg by mouth 3 (three) times daily as needed (for nerve pain). Patient not taking: Reported on 05/04/2022 10/20/18   [provider]  montelukast (SINGULAIR) 10 MG tablet Take 1 tablet (10 mg total) by mouth at bedtime. Patient not taking: Reported on 05/04/2022 04/13/22   Hunsucker, Bonna Gains, MD  omeprazole (PRILOSEC) 40 MG capsule TAKE 1 CAPSULE BY MOUTH EVERY DAY Patient not  taking: Reported on 05/04/2022 08/14/21   [name], MD  ondansetron (ZOFRAN-ODT) 4 MG disintegrating tablet TAKE 1 TO 2 TABLETS BY MOUTH EVERY 6 HOURS AS NEEDED**PLEASE CONTACT OFFICE TO SCHEDULED FOLLOW UP 03/19/22   Levin Erp, PA  OXYGEN Inhale 3 L/min into the lungs See admin instructions. 3 L/min at bedtime and as needed for shortness of breath throughout the day    [provider]  valsartan (DIOVAN) 320 MG tablet Take 320 mg by mouth daily. Patient not taking: Reported on 05/04/2022 02/16/22   [provider]    Family History  Problem Relation Age of Onset   Breast cancer Mother 83   Ovarian cancer Mother 75   Brain cancer Mother    Hypertension Father    Hyperlipidemia Father    Heart disease Father    Colon cancer Father        diagnosed in his 81s   Breast cancer Maternal Grandmother 62   Diabetes Paternal Grandmother    Breast cancer Paternal Grandmother 35   Other Son        drug over dose   Colon cancer Maternal Grandfather        dx in his 30s   Liver disease Maternal Grandfather    Breast cancer Maternal Aunt 60   Breast cancer Maternal Aunt 60   Colon cancer Maternal Uncle    Stomach cancer Neg Hx    Pancreatic cancer Neg Hx    Esophageal cancer Neg Hx      Social History   Tobacco Use   Smoking status: Former    Packs/day: 1.00    Years: 15.00    Pack years: 15.00    Types: Cigarettes    Quit date: 09/12/2017    Years since quitting: 4.6   Smokeless tobacco: Never  Vaping Use   Vaping Use: Never used  Substance Use Topics   Alcohol use: Not Currently   Drug use: No    Allergies as of 05/04/2022 - Review Complete 05/04/2022  Allergen Reaction Noted   Meperidine Hives and Anaphylaxis 04/28/2013   Strawberry extract Hives and Swelling 08/09/2020   Sucralfate Nausea Only and Other (See Comments) 02/05/2021   Wasp venom Hives 04/11/2020    Review of Systems:    All systems reviewed and negative except where noted in  HPI.   Physical Exam:  Vital signs in last 24 hours: Temp:  [98 F (36.7 C)-98.5 F (36.9 C)] 98.1 F (36.7 C) (05/20 1140) Pulse Rate:  [73-91] 73 (05/20 1140) Resp:  [15-30] 17 (05/20 1140) BP: (114-162)/(67-96) 114/67 (  05/20 1140) SpO2:  [98 %-100 %] 99 % (05/20 1140) Last BM Date : 04/27/22 General:   Pleasant, cooperative in NAD Head:  Normocephalic and atraumatic. Eyes:   No icterus.   Conjunctiva pink. PERRLA. Ears:  Normal auditory acuity. Neck:  Supple; no masses or thyroidomegaly Lungs: Respirations even and unlabored. Lungs clear to auscultation bilaterally.   No wheezes, crackles, or rhonchi.  Heart:  Regular rate and rhythm;  Without murmur, clicks, rubs or gallops Abdomen:  Soft, nondistended, diffuse tenderness which may be musculoskeletal from her continued nausea and vomiting. Normal bowel sounds. No appreciable masses or hepatomegaly.  No rebound or guarding.  Rectal:  Not performed. Msk:  Symmetrical without gross deformities.    Extremities:  Without edema, cyanosis or clubbing. Neurologic:  Alert and oriented x3;  grossly normal neurologically. Skin:  Intact without significant lesions or rashes. Cervical Nodes:  No significant cervical adenopathy. Psych:  Alert and cooperative. Normal affect.  LAB RESULTS: Recent Labs    05/04/22 0922  WBC 6.6  HGB 10.6*  HCT 34.0*  PLT 407*   BMET Recent Labs    05/04/22 0922 05/05/22 0448  NA 136 137  K 2.8* 3.4*  CL 94* 98  CO2 33* 33*  GLUCOSE 126* 113*  BUN 23 21  CREATININE 1.05* 0.91  CALCIUM 8.7* 8.5*   LFT Recent Labs    05/04/22 1345  PROT 6.8  ALBUMIN 3.1*  AST 18  ALT 9  ALKPHOS 120  BILITOT 0.9  BILIDIR 0.2  IBILI 0.7   PT/INR No results for input(s): LABPROT, INR in the last 72 hours.  STUDIES: CT ABDOMEN PELVIS W CONTRAST  Result Date: 05/04/2022 CLINICAL DATA:  Abdominal pain, vomiting. EXAM: CT ABDOMEN AND PELVIS WITH CONTRAST TECHNIQUE: Multidetector CT imaging of the  abdomen and pelvis was performed using the standard protocol following bolus administration of intravenous contrast. RADIATION DOSE REDUCTION: This exam was performed according to the departmental dose-optimization program which includes automated exposure control, adjustment of the mA and/or kV according to patient size and/or use of iterative reconstruction technique. CONTRAST:  140m OMNIPAQUE IOHEXOL 300 MG/ML  SOLN COMPARISON:  January 18, 2022. FINDINGS: Lower chest: No acute abnormality. Hepatobiliary: No focal liver abnormality is seen. Status post cholecystectomy. Mild intrahepatic and extrahepatic biliary dilatation is noted most likely due to post cholecystectomy status. Pancreas: Unremarkable. No pancreatic ductal dilatation or surrounding inflammatory changes. Spleen: Normal in size without focal abnormality. Adrenals/Urinary Tract: Adrenal glands are unremarkable. Kidneys are normal, without renal calculi, focal lesion, or hydronephrosis. Bladder is unremarkable. Stomach/Bowel: The stomach appears normal. There is no evidence of bowel obstruction or inflammation. Status post appendectomy. Sigmoid diverticulosis without inflammation. Vascular/Lymphatic: Aortic atherosclerosis. No enlarged abdominal or pelvic lymph nodes. Reproductive: Status post hysterectomy. No adnexal masses. Other: No abdominal wall hernia or abnormality. No abdominopelvic ascites. Musculoskeletal: Status post T8-T9 kyphoplasty. No acute abnormality seen. IMPRESSION: Sigmoid diverticulosis without inflammation. No acute abnormality seen in the abdomen or pelvis. Aortic Atherosclerosis (ICD10-I70.0). Electronically Signed   By: JMarijo ConceptionM.D.   On: 05/04/2022 14:31   DG Abd Portable 1 View  Result Date: 05/04/2022 CLINICAL DATA:  Possible small bowel obstruction. Abdominal pain and dry heaving beginning today. EXAM: PORTABLE ABDOMEN - 1 VIEW COMPARISON:  08/10/2020 FINDINGS: Bowel gas pattern is nonobstructive. Air and mild  stool present throughout the colon. No dilated small bowel loops. No free peritoneal air. Previous kyphoplasties of T9 and T10. Several pelvic phleboliths. IMPRESSION: Nonobstructive bowel gas pattern. Electronically  Signed   By: Marin Olp M.D.   On: 05/04/2022 13:44      Impression / Plan:   Assessment: Principal Problem:   Nausea & vomiting Active Problems:   Hypokalemia   Essential hypertension   Depression   HLD (hyperlipidemia)   GERD (gastroesophageal reflux disease)   Chronic diastolic CHF (congestive heart failure) (HCC)   COPD (chronic obstructive pulmonary disease) (HCC)   Stage 3a chronic kidney disease (HCC)   Hemiplegic migraine   Epigastric abdominal pain   Parkinson disease ([place])   Near syncope   Normocytic anemia   Morgan [name] is a 71 y.o. y/o female with nausea and vomiting with dry heaves and a history of pyloric stenosis with her most recent upper endoscopy not showing any abnormalities.  The patient had a CT scan without any sign of gastric distention indicative of gastric outlet obstruction.  The patient is now tolerating liquids which she was in prior to admission.  I am now being asked to see the patient for intractable nausea and vomiting.  Plan:  Since the patient has been tolerating liquids I will start her on a soft diet and see if she is able to handle a soft diet.  If the patient does not has a soft diet she may need to be set up for an upper endoscopy to look for any narrowing of the pylorus or obstructive lesion to explain her nausea and vomiting.  The patient has been explained the plan agrees with it.  Thank you for involving me in the care of this patient.      LOS: 0 days   Morgan Lame, MD, [facility] 05/05/2022, 12:29 PM,  Pager [phone] 7am-5pm  Check AMION for 5pm -7am coverage and on weekends   Note: This dictation was prepared with Dragon dictation along with smaller phrase technology. Any transcriptional errors that result from this  process are unintentional.

## 2022-05-05 NOTE — Progress Notes (Signed)
   05/05/22 0800  Clinical Encounter Type  Visited With Patient  Visit Type Initial  Referral From Nurse  Consult/Referral To Chaplain   Chaplain responded to nurse consult. Chaplain discussed AD paperwork with patient. There are no notaries available to complete AD at this time. Nurse can page Chaplain on call if patient and family are interested in discussing paperwork further. Patient appreciated [place] visit.

## 2022-05-06 DIAGNOSIS — R1013 Epigastric pain: Secondary | ICD-10-CM | POA: Diagnosis not present

## 2022-05-06 DIAGNOSIS — E876 Hypokalemia: Secondary | ICD-10-CM | POA: Diagnosis not present

## 2022-05-06 DIAGNOSIS — R112 Nausea with vomiting, unspecified: Secondary | ICD-10-CM | POA: Diagnosis not present

## 2022-05-06 DIAGNOSIS — G20 Parkinson's disease: Secondary | ICD-10-CM | POA: Diagnosis not present

## 2022-05-06 LAB — BASIC METABOLIC PANEL
Anion gap: 6 (ref 5–15)
BUN: 18 mg/dL (ref 8–23)
CO2: 31 mmol/L (ref 22–32)
Calcium: 7.9 mg/dL — ABNORMAL LOW (ref 8.9–10.3)
Chloride: 101 mmol/L (ref 98–111)
Creatinine, Ser: 0.92 mg/dL (ref 0.44–1.00)
GFR, Estimated: >60 mL/min (ref >60)
Glucose, Bld: 85 mg/dL (ref 70–99)
Potassium: 3.1 mmol/L — ABNORMAL LOW (ref 3.5–5.1)
Sodium: 138 mmol/L (ref 135–145)

## 2022-05-06 LAB — CBC
HCT: 27.2 % — ABNORMAL LOW (ref 36.0–46.0)
Hemoglobin: 8.4 g/dL — ABNORMAL LOW (ref 12.0–15.0)
MCH: 24.7 pg — ABNORMAL LOW (ref 26.0–34.0)
MCHC: 30.9 g/dL (ref 30.0–36.0)
MCV: 80 fL (ref 80.0–100.0)
Platelets: 301 10*3/uL (ref 150–400)
RBC: 3.4 MIL/uL — ABNORMAL LOW (ref 3.87–5.11)
RDW: 14.2 % (ref 11.5–15.5)
WBC: 5.2 10*3/uL (ref 4.0–10.5)
nRBC: 0 % (ref 0.0–0.2)

## 2022-05-06 MED ORDER — PROSOURCE PLUS PO LIQD
30.0000 mL | Freq: Three times a day (TID) | ORAL | Status: DC
  Filled 2022-05-06 (×4): qty 30

## 2022-05-06 MED ORDER — POTASSIUM CHLORIDE CRYS ER 20 MEQ PO TBCR
40.0000 meq | EXTENDED_RELEASE_TABLET | Freq: Two times a day (BID) | ORAL | Status: DC
  Administered 2022-05-06: 40 meq via ORAL
  Filled 2022-05-06: qty 2

## 2022-05-06 MED ORDER — BOOST / RESOURCE BREEZE PO LIQD CUSTOM
1.0000 | Freq: Three times a day (TID) | ORAL | Status: DC
  Administered 2022-05-07 – 2022-05-10 (×3): 1 via ORAL

## 2022-05-06 MED ORDER — ADULT MULTIVITAMIN W/MINERALS CH
1.0000 | ORAL_TABLET | Freq: Every day | ORAL | Status: DC
  Administered 2022-05-06 – 2022-05-10 (×4): 1 via ORAL
  Filled 2022-05-06 (×4): qty 1

## 2022-05-06 NOTE — Progress Notes (Signed)
[name], MD [facility]   [address], [place], [place] [address] Phone: [phone] Fax : [phone]   Subjective: This patient was seen yesterday for a consultation due to nausea and vomiting.  The patient has a history of pyloric stenosis and has been dilated in the past.  As reported in my consult note the patient had a repeat upper endoscopy by [place], Dr. Haig Prophet, back in November without any stenosis seen.  The patient was tolerating liquids yesterday which she was not doing at home and then had her diet advanced to a soft diet yesterday to see how she would tolerate it. The nurse called this morning stating that the patient did not tolerate the soft diet and she was being put back on her clear liquid diet.  When I came to the room today the patient was tolerating the clear liquid diet well without any incident   Objective: Vital signs in last 24 hours: Vitals:   05/05/22 1610 05/05/22 1954 05/06/22 0104 05/06/22 0515  BP: 135/70 133/71 117/64 136/74  Pulse: 71 69 70 73  Resp: '17 16 16 16  '$ Temp: 98.1 F (36.7 C) (!) 97.4 F (36.3 C) 98 F (36.7 C) 98.1 F (36.7 C)  TempSrc:  Axillary Oral Oral  SpO2: 99% 98% 98% 97%  Weight:      Height:       Weight change:   Intake/Output Summary (Last 24 hours) at 05/06/2022 2505 Last data filed at 05/06/2022 0300 Gross per 24 hour  Intake 2091.3 ml  Output 600 ml  Net 1491.3 ml     Exam: Heart:: Regular rate and rhythm or without murmur or extra heart sounds Lungs: normal and clear to auscultation and percussion Abdomen: soft, nontender, normal bowel sounds   Lab Results: '@LABTEST2'$ @ Micro Results: No results found for this or any previous visit (from the past 240 hour(s)). Studies/Results: CT ABDOMEN PELVIS W CONTRAST  Result Date: 05/04/2022 CLINICAL DATA:  Abdominal pain, vomiting. EXAM: CT ABDOMEN AND PELVIS WITH CONTRAST TECHNIQUE: Multidetector CT imaging of the abdomen and pelvis was performed using the  standard protocol following bolus administration of intravenous contrast. RADIATION DOSE REDUCTION: This exam was performed according to the departmental dose-optimization program which includes automated exposure control, adjustment of the mA and/or kV according to patient size and/or use of iterative reconstruction technique. CONTRAST:  168m OMNIPAQUE IOHEXOL 300 MG/ML  SOLN COMPARISON:  January 18, 2022. FINDINGS: Lower chest: No acute abnormality. Hepatobiliary: No focal liver abnormality is seen. Status post cholecystectomy. Mild intrahepatic and extrahepatic biliary dilatation is noted most likely due to post cholecystectomy status. Pancreas: Unremarkable. No pancreatic ductal dilatation or surrounding inflammatory changes. Spleen: Normal in size without focal abnormality. Adrenals/Urinary Tract: Adrenal glands are unremarkable. Kidneys are normal, without renal calculi, focal lesion, or hydronephrosis. Bladder is unremarkable. Stomach/Bowel: The stomach appears normal. There is no evidence of bowel obstruction or inflammation. Status post appendectomy. Sigmoid diverticulosis without inflammation. Vascular/Lymphatic: Aortic atherosclerosis. No enlarged abdominal or pelvic lymph nodes. Reproductive: Status post hysterectomy. No adnexal masses. Other: No abdominal wall hernia or abnormality. No abdominopelvic ascites. Musculoskeletal: Status post T8-T9 kyphoplasty. No acute abnormality seen. IMPRESSION: Sigmoid diverticulosis without inflammation. No acute abnormality seen in the abdomen or pelvis. Aortic Atherosclerosis (ICD10-I70.0). Electronically Signed   By: JMarijo ConceptionM.D.   On: 05/04/2022 14:31   DG Abd Portable 1 View  Result Date: 05/04/2022 CLINICAL DATA:  Possible small bowel obstruction. Abdominal pain and dry heaving beginning today. EXAM: PORTABLE  ABDOMEN - 1 VIEW COMPARISON:  08/10/2020 FINDINGS: Bowel gas pattern is nonobstructive. Air and mild stool present throughout the colon. No  dilated small bowel loops. No free peritoneal air. Previous kyphoplasties of T9 and T10. Several pelvic phleboliths. IMPRESSION: Nonobstructive bowel gas pattern. Electronically Signed   By: Marin Olp M.D.   On: 05/04/2022 13:44   Medications: I have reviewed the patient's current medications. Scheduled Meds:  atorvastatin  40 mg Oral Daily   carbidopa-levodopa  2 tablet Oral TID   enoxaparin (LOVENOX) injection  40 mg Subcutaneous Q24H   fluticasone furoate-vilanterol  1 puff Inhalation Daily   pantoprazole (PROTONIX) IV  40 mg Intravenous Q12H   potassium chloride  40 mEq Oral BID   scopolamine  1 patch Transdermal Q72H   umeclidinium bromide  1 puff Inhalation Daily   Continuous Infusions:  sodium chloride 75 mL/hr at 05/06/22 0717   PRN Meds:.acetaminophen, albuterol, aspirin-acetaminophen-caffeine, bisacodyl, diphenhydrAMINE, docusate sodium, hydrALAZINE, hydrOXYzine, metoCLOPramide (REGLAN) injection, morphine injection, oxyCODONE-acetaminophen   Assessment: Principal Problem:   Nausea & vomiting Active Problems:   Hypokalemia   Essential hypertension   Depression   HLD (hyperlipidemia)   GERD (gastroesophageal reflux disease)   Chronic diastolic CHF (congestive heart failure) (HCC)   COPD (chronic obstructive pulmonary disease) (HCC)   Stage 3a chronic kidney disease (HCC)   Hemiplegic migraine   Epigastric abdominal pain   Parkinson disease (HCC)   Near syncope   Normocytic anemia   Abdominal pain   Nausea   Intractable nausea and vomiting    Plan: This patient has a history of pyloric stenosis and needed dilation in the past.  The patient was tried on a soft diet yesterday and was unable to handle it.  The patient is tolerating a liquid diet which she was unable to do prior to admission.  The patient has failed a test with advancing her diet therefore will be set up for an upper endoscopy tomorrow by Dr. Vicente Males.  The patient has been explained the plan agrees with  it.   LOS: 1 day   Lewayne Bunting 05/06/2022, 7:42 AM Pager [phone] 7am-5pm  Check AMION for 5pm -7am coverage and on weekends

## 2022-05-06 NOTE — Progress Notes (Signed)
Initial Nutrition Assessment  DOCUMENTATION CODES:   Not applicable  INTERVENTION:   -MVI with minerals daily -Boost Breeze po TID, each supplement provides 250 kcal and 9 grams of protein  -30 ml Prosource Plus TID, each supplement provides 100 kcals and 15 grams protein -RD will follow for diet advancement and adjust supplement regimen as appropriate  NUTRITION DIAGNOSIS:   Inadequate oral intake related to nausea, vomiting as evidenced by per patient/family report, meal completion < 25%.  GOAL:   Patient will meet greater than or equal to 90% of their needs  MONITOR:   PO intake, Supplement acceptance, Diet advancement  REASON FOR ASSESSMENT:   Malnutrition Screening Tool    ASSESSMENT:   Morgan [name] is a 71 y.o. femalePtweakness, hypertension, hyperlipidemia, COPD on 3 L oxygen, GERD, depression, anxiety, Parkinson's disease, CHF, anemia, CKD-3A who presents with epigastric abdominal pain, intractable nausea and dry heaves, near syncope.  Pt admitted with nausea and vomiting.   Reviewed I/O's: +1.5 L x 24 hours and +3.3 L since admission  UOP: 600 ml x 24 hours  Pt unavailable at time of visit. Attempted to speak with pt via call to hospital room phone, however, unable to reach. RD unable to obtain further nutrition-related history or complete nutrition-focused physical exam at this time.    Pt currently on a clear liquid diet. No meal completion data available to assess at this time. Per RN notes, pt to remain on clear liquids; soft diet was trialed, but was unable to keep down one bite of eggs. Per GI notes, plan for upper endoscopy tomorrow (05/07/22).   Reviewed wt hx;pt has experienced a 5.3% wt loss over the past 2 months, which is not significant for time frame.   Medications reviewed and include sinemet, potassium chloride, and 0.9% sodium chloride infusion @ 75 ml/hr.   Labs reviewed: K: 3.1.    Diet Order:   Diet Order             Diet NPO time  specified  Diet effective midnight           Diet clear liquid Room service appropriate? Yes; Fluid consistency: Thin  Diet effective now                   EDUCATION NEEDS:   No education needs have been identified at this time  Skin:  Skin Assessment: Reviewed RN Assessment  Last BM:  05/05/22  Height:   Ht Readings from Last 1 Encounters:  05/04/22 '6\' 1"'$  (1.854 m)    Weight:   Wt Readings from Last 1 Encounters:  05/04/22 90.7 kg    Ideal Body Weight:  86.4 kg  BMI:  Body mass index is 26.39 kg/m.  Estimated Nutritional Needs:   Kcal:  2150-2350  Protein:  115-130 grams  Fluid:  > 2 L    Loistine Chance, RD, LDN, [place] Registered Dietitian II Certified Diabetes Care and Education Specialist Please refer to [facility] for RD and/or RD on-call/weekend/after hours pager

## 2022-05-06 NOTE — Progress Notes (Signed)
Made both Dr. Allen Norris and Dr. Jimmye Norman aware patient is unable to tolerate soft diet. Patient took one bite of eggs and was unable to keep down, 7/10 abdominal pain. Per Dr. Allen Norris change diet back to clear liquids. Will continue to monitor

## 2022-05-06 NOTE — Progress Notes (Signed)
PROGRESS NOTE    Morgan [name]  QQV:[id number] DOB: 06/05/1951 DOA: 05/04/2022 PCP: [name], MD   Assessment & Plan:   Principal Problem:   Nausea & vomiting Active Problems:   Epigastric abdominal pain   Hypokalemia   Essential hypertension   Depression   HLD (hyperlipidemia)   GERD (gastroesophageal reflux disease)   Chronic diastolic CHF (congestive heart failure) (HCC)   COPD (chronic obstructive pulmonary disease) (HCC)   Stage 3a chronic kidney disease (HCC)   Hemiplegic migraine   Parkinson disease (HCC)   Near syncope   Normocytic anemia   Abdominal pain   Nausea   Intractable nausea and vomiting  Assessment and Plan: Nausea & vomiting: etiology unclear, CT abd/pelvis is neg for any acute findings. Hx of pyloric stenosis. Still w/ nausea & vomiting today. Benadryl prn for nausea/vomiting as pt has prolonged QTc. Continue w/ scopolamine patch. Reglan prn. GI following and recs apprec   Normocytic anemia: H&H are labile. Will transfuse if Hb < 7.0     Near syncope: likely secondary to dehydration & orthostatic. Continue on IVFs    Parkinson disease: continue on home dose of carbidopa-levodopa    Hemiplegic migraine: excedrin prn. Depakote was d/c by pt's physician    CKDIIIa: Cr is better than baseline    COPD: w/o exacerbation. Continue on supplemental oxygen, uses 3L [place] at home   Chronic diastolic CHF: echo on 02/16/2950 showed EF of 60 to 65% with grade 1 diastolic dysfunction.  Patient does not have leg edema or JVD. Hold lasix. CHF appears compensated    GERD: continue on PPI    HLD: continue on statin    Depression: severity unknown. Pt stopped taking fluoxetine    HTN:  BP is WNL.     Hypokalemia: potassium given   Thrombocytosis: resolved      DVT prophylaxis: lovenox  Code Status: full  Family Communication:  Disposition Plan: PT/OT consulted   Level of care: Telemetry Cardiac  Status is: Inpatient Remains inpatient appropriate  because: severity of illness     Consultants:  GI   Procedures:   Antimicrobials:    Subjective: Pt c/o nausea & vomiting   Objective: Vitals:   05/05/22 1610 05/05/22 1954 05/06/22 0104 05/06/22 0515  BP: 135/70 133/71 117/64 136/74  Pulse: 71 69 70 73  Resp: '17 16 16 16  '$ Temp: 98.1 F (36.7 C) (!) 97.4 F (36.3 C) 98 F (36.7 C) 98.1 F (36.7 C)  TempSrc:  Axillary Oral Oral  SpO2: 99% 98% 98% 97%  Weight:      Height:        Intake/Output Summary (Last 24 hours) at 05/06/2022 0727 Last data filed at 05/06/2022 0300 Gross per 24 hour  Intake 2091.3 ml  Output 600 ml  Net 1491.3 ml   Filed Weights   05/04/22 0915  Weight: 90.7 kg    Examination:  General exam: Appears calm & comfortable  Respiratory system: decreased breath sounds b/l  Cardiovascular system: S1/S2+. No rubs or clicks  Gastrointestinal system: Abd is soft, NT, ND & hypoactive bowel sounds  Central nervous system: Alert and oriented. Moves all extremities  Psychiatry: judgement and insight is at baseline. Flat mood and affect     Data Reviewed: I have personally reviewed following labs and imaging studies  CBC: Recent Labs  Lab 05/04/22 0922 05/06/22 0421  WBC 6.6 5.2  HGB 10.6* 8.4*  HCT 34.0* 27.2*  MCV 80.2 80.0  PLT 407* 301  Basic Metabolic Panel: Recent Labs  Lab 05/04/22 0922 05/05/22 0448 05/06/22 0421  NA 136 137 138  K 2.8* 3.4* 3.1*  CL 94* 98 101  CO2 33* 33* 31  GLUCOSE 126* 113* 85  BUN '23 21 18  '$ CREATININE 1.05* 0.91 0.92  CALCIUM 8.7* 8.5* 7.9*  MG 1.9  --   --    GFR: Estimated Creatinine Clearance: 75.3 mL/min (by C-G formula based on SCr of 0.92 mg/dL). Liver Function Tests: Recent Labs  Lab 05/04/22 1345  AST 18  ALT 9  ALKPHOS 120  BILITOT 0.9  PROT 6.8  ALBUMIN 3.1*   Recent Labs  Lab 05/04/22 1345  LIPASE 21   No results for input(s): AMMONIA in the last 168 hours. Coagulation Profile: No results for input(s): INR, PROTIME in  the last 168 hours. Cardiac Enzymes: No results for input(s): CKTOTAL, CKMB, CKMBINDEX, TROPONINI in the last 168 hours. BNP (last 3 results) Recent Labs    08/29/21 0931  PROBNP 180.0*   HbA1C: No results for input(s): HGBA1C in the last 72 hours. CBG: No results for input(s): GLUCAP in the last 168 hours. Lipid Profile: No results for input(s): CHOL, HDL, LDLCALC, TRIG, CHOLHDL, LDLDIRECT in the last 72 hours. Thyroid Function Tests: No results for input(s): TSH, T4TOTAL, FREET4, T3FREE, THYROIDAB in the last 72 hours. Anemia Panel: No results for input(s): VITAMINB12, FOLATE, FERRITIN, TIBC, IRON, RETICCTPCT in the last 72 hours. Sepsis Labs: Recent Labs  Lab 05/04/22 1345  LATICACIDVEN 1.4    No results found for this or any previous visit (from the past 240 hour(s)).       Radiology Studies: CT ABDOMEN PELVIS W CONTRAST  Result Date: 05/04/2022 CLINICAL DATA:  Abdominal pain, vomiting. EXAM: CT ABDOMEN AND PELVIS WITH CONTRAST TECHNIQUE: Multidetector CT imaging of the abdomen and pelvis was performed using the standard protocol following bolus administration of intravenous contrast. RADIATION DOSE REDUCTION: This exam was performed according to the departmental dose-optimization program which includes automated exposure control, adjustment of the mA and/or kV according to patient size and/or use of iterative reconstruction technique. CONTRAST:  142m OMNIPAQUE IOHEXOL 300 MG/ML  SOLN COMPARISON:  January 18, 2022. FINDINGS: Lower chest: No acute abnormality. Hepatobiliary: No focal liver abnormality is seen. Status post cholecystectomy. Mild intrahepatic and extrahepatic biliary dilatation is noted most likely due to post cholecystectomy status. Pancreas: Unremarkable. No pancreatic ductal dilatation or surrounding inflammatory changes. Spleen: Normal in size without focal abnormality. Adrenals/Urinary Tract: Adrenal glands are unremarkable. Kidneys are normal, without renal  calculi, focal lesion, or hydronephrosis. Bladder is unremarkable. Stomach/Bowel: The stomach appears normal. There is no evidence of bowel obstruction or inflammation. Status post appendectomy. Sigmoid diverticulosis without inflammation. Vascular/Lymphatic: Aortic atherosclerosis. No enlarged abdominal or pelvic lymph nodes. Reproductive: Status post hysterectomy. No adnexal masses. Other: No abdominal wall hernia or abnormality. No abdominopelvic ascites. Musculoskeletal: Status post T8-T9 kyphoplasty. No acute abnormality seen. IMPRESSION: Sigmoid diverticulosis without inflammation. No acute abnormality seen in the abdomen or pelvis. Aortic Atherosclerosis (ICD10-I70.0). Electronically Signed   By: JMarijo ConceptionM.D.   On: 05/04/2022 14:31   DG Abd Portable 1 View  Result Date: 05/04/2022 CLINICAL DATA:  Possible small bowel obstruction. Abdominal pain and dry heaving beginning today. EXAM: PORTABLE ABDOMEN - 1 VIEW COMPARISON:  08/10/2020 FINDINGS: Bowel gas pattern is nonobstructive. Air and mild stool present throughout the colon. No dilated small bowel loops. No free peritoneal air. Previous kyphoplasties of T9 and T10. Several pelvic phleboliths. IMPRESSION: Nonobstructive bowel  gas pattern. Electronically Signed   By: Marin Olp M.D.   On: 05/04/2022 13:44        Scheduled Meds:  atorvastatin  40 mg Oral Daily   carbidopa-levodopa  2 tablet Oral TID   enoxaparin (LOVENOX) injection  40 mg Subcutaneous Q24H   fluticasone furoate-vilanterol  1 puff Inhalation Daily   pantoprazole (PROTONIX) IV  40 mg Intravenous Q12H   scopolamine  1 patch Transdermal Q72H   umeclidinium bromide  1 puff Inhalation Daily   Continuous Infusions:  sodium chloride 75 mL/hr at 05/06/22 0717     LOS: 1 day    Time spent: 25 mins     Wyvonnia Dusky, MD [facility] Pager [phone]  If 7PM-7AM, please contact night-coverage 05/06/2022, 7:27 AM

## 2022-05-07 ENCOUNTER — Telehealth: Payer: Self-pay | Admitting: Oncology

## 2022-05-07 ENCOUNTER — Inpatient Hospital Stay: Payer: Medicare HMO | Admitting: Certified Registered Nurse Anesthetist

## 2022-05-07 ENCOUNTER — Encounter
Admission: EM | Disposition: A | Discharge status: Home / Self Care | Payer: Self-pay | Source: Home / Self Care | Attending: Internal Medicine

## 2022-05-07 ENCOUNTER — Inpatient Hospital Stay: Payer: Medicare HMO

## 2022-05-07 DIAGNOSIS — G20 Parkinson's disease: Secondary | ICD-10-CM | POA: Diagnosis not present

## 2022-05-07 DIAGNOSIS — R112 Nausea with vomiting, unspecified: Secondary | ICD-10-CM

## 2022-05-07 DIAGNOSIS — K319 Disease of stomach and duodenum, unspecified: Secondary | ICD-10-CM | POA: Diagnosis not present

## 2022-05-07 DIAGNOSIS — E876 Hypokalemia: Secondary | ICD-10-CM | POA: Diagnosis not present

## 2022-05-07 HISTORY — PX: ESOPHAGOGASTRODUODENOSCOPY (EGD) WITH PROPOFOL: SHX5813

## 2022-05-07 LAB — BASIC METABOLIC PANEL
Anion gap: 7 (ref 5–15)
BUN: 14 mg/dL (ref 8–23)
CO2: 29 mmol/L (ref 22–32)
Calcium: 8.3 mg/dL — ABNORMAL LOW (ref 8.9–10.3)
Chloride: 103 mmol/L (ref 98–111)
Creatinine, Ser: 0.74 mg/dL (ref 0.44–1.00)
GFR, Estimated: >60 mL/min (ref >60)
Glucose, Bld: 81 mg/dL (ref 70–99)
Potassium: 3.3 mmol/L — ABNORMAL LOW (ref 3.5–5.1)
Sodium: 139 mmol/L (ref 135–145)

## 2022-05-07 LAB — CBC
HCT: 26.2 % — ABNORMAL LOW (ref 36.0–46.0)
Hemoglobin: 8.1 g/dL — ABNORMAL LOW (ref 12.0–15.0)
MCH: 24.5 pg — ABNORMAL LOW (ref 26.0–34.0)
MCHC: 30.9 g/dL (ref 30.0–36.0)
MCV: 79.2 fL — ABNORMAL LOW (ref 80.0–100.0)
Platelets: 267 10*3/uL (ref 150–400)
RBC: 3.31 MIL/uL — ABNORMAL LOW (ref 3.87–5.11)
RDW: 14.2 % (ref 11.5–15.5)
WBC: 5.5 10*3/uL (ref 4.0–10.5)
nRBC: 0 % (ref 0.0–0.2)

## 2022-05-07 LAB — URINALYSIS, ROUTINE W REFLEX MICROSCOPIC
Bilirubin Urine: NEGATIVE
Glucose, UA: NEGATIVE mg/dL
Hgb urine dipstick: NEGATIVE
Ketones, ur: NEGATIVE mg/dL
Leukocytes,Ua: NEGATIVE
Nitrite: NEGATIVE
Protein, ur: NEGATIVE mg/dL
Specific Gravity, Urine: 1.013 (ref 1.005–1.030)
pH: 6 (ref 5.0–8.0)

## 2022-05-07 LAB — MAGNESIUM: Magnesium: 1.9 mg/dL (ref 1.7–2.4)

## 2022-05-07 SURGERY — ESOPHAGOGASTRODUODENOSCOPY (EGD) WITH PROPOFOL
Anesthesia: General

## 2022-05-07 MED ORDER — SODIUM CHLORIDE 0.9 % IV SOLN
INTRAVENOUS | Status: DC
  Administered 2022-05-07: 1000 mL via INTRAVENOUS

## 2022-05-07 MED ORDER — PROPOFOL 10 MG/ML IV BOLUS
INTRAVENOUS | Status: DC | PRN
  Administered 2022-05-07: 20 mg via INTRAVENOUS
  Administered 2022-05-07: 40 mg via INTRAVENOUS
  Administered 2022-05-07: 20 mg via INTRAVENOUS

## 2022-05-07 MED ORDER — DEXAMETHASONE SODIUM PHOSPHATE 10 MG/ML IJ SOLN
INTRAMUSCULAR | Status: AC
  Filled 2022-05-07: qty 1

## 2022-05-07 MED ORDER — ONDANSETRON HCL 4 MG/2ML IJ SOLN
INTRAMUSCULAR | Status: AC
  Filled 2022-05-07: qty 2

## 2022-05-07 MED ORDER — POTASSIUM CHLORIDE 10 MEQ/100ML IV SOLN
10.0000 meq | INTRAVENOUS | Status: AC
  Administered 2022-05-07 (×4): 10 meq via INTRAVENOUS
  Filled 2022-05-07 (×4): qty 100

## 2022-05-07 MED ORDER — METOCLOPRAMIDE HCL 10 MG PO TABS
5.0000 mg | ORAL_TABLET | Freq: Three times a day (TID) | ORAL | Status: DC
  Administered 2022-05-07 – 2022-05-10 (×12): 5 mg via ORAL
  Filled 2022-05-07 (×12): qty 1

## 2022-05-07 MED ORDER — PROPOFOL 500 MG/50ML IV EMUL
INTRAVENOUS | Status: DC | PRN
  Administered 2022-05-07: 130 ug/kg/min via INTRAVENOUS

## 2022-05-07 MED ORDER — POTASSIUM CHLORIDE CRYS ER 20 MEQ PO TBCR
40.0000 meq | EXTENDED_RELEASE_TABLET | Freq: Once | ORAL | Status: DC
Start: 2022-05-07 — End: 2022-05-07

## 2022-05-07 MED ORDER — LIDOCAINE HCL (CARDIAC) PF 100 MG/5ML IV SOSY
PREFILLED_SYRINGE | INTRAVENOUS | Status: DC | PRN
  Administered 2022-05-07: 100 mg via INTRAVENOUS

## 2022-05-07 MED ORDER — SODIUM CHLORIDE 0.9 % IV SOLN: INTRAVENOUS | Status: DC

## 2022-05-07 MED ORDER — ONDANSETRON HCL 4 MG/2ML IJ SOLN
INTRAMUSCULAR | Status: DC | PRN
  Administered 2022-05-07: 4 mg via INTRAVENOUS

## 2022-05-07 NOTE — Telephone Encounter (Signed)
pt called in, currently in hospital, wants to r/s .Marland KitchenKJ

## 2022-05-07 NOTE — Anesthesia Procedure Notes (Addendum)
Date/Time: 05/07/2022 10:27 AM Performed by: Demetrius Charity, CRNA Pre-anesthesia Checklist: Patient identified, Emergency Drugs available, Suction available, Patient being monitored and Timeout performed Patient Re-evaluated:Patient Re-evaluated prior to induction Oxygen Delivery Method: Simple face mask Induction Type: IV induction Placement Confirmation: CO2 detector and positive ETCO2

## 2022-05-07 NOTE — Progress Notes (Signed)
OT Cancellation Note  Patient Details Name: Morgan [name] MRN: [id number] DOB: 1951/06/16   Cancelled Treatment:    Reason Eval/Treat Not Completed: Medical issues which prohibited therapy. Consult received, chart reviewed. Upon attempt, pt reporting severe nausea and vomiting with blue emesis bag in hand and extras on tray table. Attempts to improve comfort and upright positioning to minimize aspiration risk were unsuccessful. Will re-attempt OT evaluation at later date/time as pt is able to tolerate.   Ardeth Perfect., MPH, MS, OTR/L ascom [phone] 05/07/22, 9:01 AM

## 2022-05-07 NOTE — Op Note (Signed)
[facility] Patient Name: Morgan [name] Procedure Date: 05/07/2022 10:12 AM MRN: [id number] Account #: [id number] Date of Birth: 1951/10/17 Admit Type: Inpatient Age: 71 Room: [facility] ENDO ROOM 1 Gender: Female Note Status: Finalized Instrument Name: Altamese Cabal Endoscope [id number] Procedure:             Upper GI endoscopy Indications:           Nausea with vomiting Providers:             Lin Landsman MD, MD Medicines:             General Anesthesia Complications:         No immediate complications. Estimated blood loss: None. Procedure:             Pre-Anesthesia Assessment:                        - Prior to the procedure, a History and Physical was                         performed, and patient medications and allergies were                         reviewed. The patient is competent. The risks and                         benefits of the procedure and the sedation options and                         risks were discussed with the patient. All questions                         were answered and informed consent was obtained.                         Patient identification and proposed procedure were                         verified by the physician, the nurse, the                         anesthesiologist, the anesthetist and the technician                         in the pre-procedure area in the procedure room in the                         endoscopy suite. Mental Status Examination: alert and                         oriented. Airway Examination: normal oropharyngeal                         airway and neck mobility. Respiratory Examination:                         clear to auscultation. CV Examination: normal.                         Prophylactic Antibiotics:  The patient does not require                         prophylactic antibiotics. Prior Anticoagulants: The                         patient has taken no previous anticoagulant or                          antiplatelet agents. ASA Grade Assessment: III - A                         patient with severe systemic disease. After reviewing                         the risks and benefits, the patient was deemed in                         satisfactory condition to undergo the procedure. The                         anesthesia plan was to use general anesthesia.                         Immediately prior to administration of medications,                         the patient was re-assessed for adequacy to receive                         sedatives. The heart rate, respiratory rate, oxygen                         saturations, blood pressure, adequacy of pulmonary                         ventilation, and response to care were monitored                         throughout the procedure. The physical status of the                         patient was re-assessed after the procedure.                        After obtaining informed consent, the endoscope was                         passed under direct vision. Throughout the procedure,                         the patient's blood pressure, pulse, and oxygen                         saturations were monitored continuously. The Endoscope                         was introduced through the mouth, and advanced to the  second part of duodenum. The upper GI endoscopy was                         accomplished without difficulty. The patient tolerated                         the procedure well. Findings:      The duodenal bulb and second portion of the duodenum were normal.      Striped mildly erythematous mucosa without bleeding was found in the       gastric antrum. Biopsies were taken with a cold forceps for histology.      The cardia and gastric fundus were normal on retroflexion.      The gastric body, incisura, prepyloric region of the stomach and pylorus       were normal. Biopsies were taken with a cold forceps for Helicobacter       pylori  testing.      The gastroesophageal junction and examined esophagus were normal. Impression:            - Normal duodenal bulb and second portion of the                         duodenum.                        - Erythematous mucosa in the antrum. Biopsied.                        - Normal gastric body, incisura, prepyloric region of                         the stomach and pylorus. Biopsied.                        - Normal gastroesophageal junction and esophagus. Recommendation:        - Return patient to hospital ward for ongoing care.                        - Advance diet as tolerated today.                        - Await pathology results. Procedure Code(s):     --- Professional ---                        [id number], Esophagogastroduodenoscopy, flexible,                         transoral; with biopsy, single or multiple Diagnosis Code(s):     --- Professional ---                        K31.89, Other diseases of stomach and duodenum                        R11.2, Nausea with vomiting, unspecified CPT copyright 2019 American Medical Association. All rights reserved. The codes documented in this report are preliminary and upon coder review may  be revised to meet current compliance requirements. Dr. Ulyess Mort Lin Landsman MD, MD 05/07/2022 10:39:05 AM This report has been signed  electronically. Number of Addenda: 0 Note Initiated On: 05/07/2022 10:12 AM Estimated Blood Loss:  Estimated blood loss: none.      [facility]

## 2022-05-07 NOTE — Transfer of Care (Signed)
Immediate Anesthesia Transfer of Care Note  Patient: Morgan [name]  Procedure(s) Performed: ESOPHAGOGASTRODUODENOSCOPY (EGD) WITH PROPOFOL  Patient Location: PACU  Anesthesia Type:General  Level of Consciousness: awake  Airway & Oxygen Therapy: Patient Spontanous Breathing and Patient connected to face mask oxygen  Post-op Assessment: Report given to RN and Post -op Vital signs reviewed and stable  Post vital signs: Reviewed and stable  Last Vitals:  Vitals Value Taken Time  BP    Temp    Pulse    Resp    SpO2      Last Pain:  Vitals:   05/07/22 1013  TempSrc: Temporal  PainSc: 1       Patients Stated Pain Goal: 0 ([id number])  Complications: No notable events documented.

## 2022-05-07 NOTE — Progress Notes (Signed)
PT Cancellation Note  Patient Details Name: TRICHELLE LEHAN MRN: [id number] DOB: December 17, 1951   Cancelled Treatment:    Reason Eval/Treat Not Completed: Patient out of room at procedure.  Will attempt to see pt at a future date/time as medically appropriate.     Linus Salmons PT, DPT 05/07/22, 10:11 AM

## 2022-05-07 NOTE — Progress Notes (Signed)
PT Cancellation Note  Patient Details Name: Morgan [name] MRN: [id number] DOB: 02-02-1951   Cancelled Treatment:    Reason Eval/Treat Not Completed: Patient declined to participate with PT services this date secondary to nausea and dizziness.  Pt stated would attempt to participate tomorrow as able.  Will attempt to see pt at a future date/time as medically appropriate.    Linus Salmons PT, DPT 05/07/22, 2:48 PM

## 2022-05-07 NOTE — Anesthesia Postprocedure Evaluation (Signed)
Anesthesia Post Note  Patient: Morgan [name]  Procedure(s) Performed: ESOPHAGOGASTRODUODENOSCOPY (EGD) WITH PROPOFOL  Patient location during evaluation: PACU Anesthesia Type: General Level of consciousness: awake and oriented Pain management: pain level controlled Vital Signs Assessment: post-procedure vital signs reviewed and stable Respiratory status: spontaneous breathing Cardiovascular status: stable Anesthetic complications: no   No notable events documented.   Last Vitals:  Vitals:   05/07/22 1107 05/07/22 1143  BP:  137/84  Pulse:  72  Resp:  19  Temp:  36.8 C  SpO2: 99% 98%    Last Pain:  Vitals:   05/07/22 1143  TempSrc: Oral  PainSc: 6                  VAN STAVEREN,Danzig Macgregor

## 2022-05-07 NOTE — Progress Notes (Addendum)
PROGRESS NOTE    Morgan [name]  MWN:[id number] DOB: Feb 03, 1951 DOA: 05/04/2022 PCP: [name], MD   Assessment & Plan:   Principal Problem:   Nausea & vomiting Active Problems:   Epigastric abdominal pain   Hypokalemia   Essential hypertension   Depression   HLD (hyperlipidemia)   GERD (gastroesophageal reflux disease)   Chronic diastolic CHF (congestive heart failure) (HCC)   COPD (chronic obstructive pulmonary disease) (HCC)   Stage 3a chronic kidney disease (HCC)   Hemiplegic migraine   Parkinson disease (HCC)   Near syncope   Normocytic anemia   Abdominal pain   Nausea   Intractable nausea and vomiting  Assessment and Plan: Nausea & vomiting: etiology unclear, CT abd/pelvis is neg for any acute findings. Hx of pyloric stenosis. W/ nausea & dry heaving today. Continue on scopolamine patch & reglan. S/p EGD which was normal & biopsies were taken. Celiac panel ordered as per GI   Normocytic anemia: H&H are labile. Will transfuse if Hb < 7.0     Near syncope: likely secondary to dehydration & orthostatic.    Parkinson disease: continue on home dose of carbidopa-levodopa    Hemiplegic migraine: excedrin prn. Depakote was d/c by pt's physician    CKDIIIa: Cr is better than baseline    COPD: w/o exacerbation. Continue on supplemental oxygen, uses 3L [place] at home   Chronic diastolic CHF: echo on 4/0/3474 showed EF of 60 to 65% with grade 1 diastolic dysfunction.  Patient does not have leg edema or JVD. CHF compensated. Hold lasix    GERD: continue on PPI    HLD: continue on statin    Depression: severity unknown. Pt stopped taking fluoxetine    HTN:  BP is WNL.     Hypokalemia: KCl repleated  Thrombocytosis: resolved   Chronic respiratory failure: continue on supplemental oxygen, uses 3L at home    DVT prophylaxis: lovenox  Code Status: full  Family Communication:  Disposition Plan: depends on PT/OT recs   Level of care: Telemetry Cardiac  Status is:  Inpatient Remains inpatient appropriate because: severity of illness     Consultants:  GI   Procedures:   Antimicrobials:    Subjective: Pt c/o nausea & dry heaving   Objective: Vitals:   05/06/22 1555 05/06/22 1939 05/06/22 2335 05/07/22 0326  BP: 119/63 115/70 126/69 128/63  Pulse: 73 67 70 73  Resp: '17 15 14 14  '$ Temp: 98.1 F (36.7 C) 98.2 F (36.8 C) 98.1 F (36.7 C) 98.4 F (36.9 C)  TempSrc:      SpO2: 99% 99% 99% 97%  Weight:      Height:        Intake/Output Summary (Last 24 hours) at 05/07/2022 0746 Last data filed at 05/07/2022 0350 Gross per 24 hour  Intake 1843.22 ml  Output 1050 ml  Net 793.22 ml   Filed Weights   05/04/22 0915  Weight: 90.7 kg    Examination:  General exam: Appears comfortable   Respiratory system: diminished breath sounds b/l   Cardiovascular system: S1/S2+. No gallops or rubs  Gastrointestinal system: Abd is soft, NT, ND & hypoactive bowel sounds  Central nervous system: Alert and oriented. Moves all extremities  Psychiatry: judgement and insight is at baseline. Flat mood and affect      Data Reviewed: I have personally reviewed following labs and imaging studies  CBC: Recent Labs  Lab 05/04/22 0922 05/06/22 0421 05/07/22 0333  WBC 6.6 5.2 5.5  HGB 10.6*  8.4* 8.1*  HCT 34.0* 27.2* 26.2*  MCV 80.2 80.0 79.2*  PLT 407* 301 458   Basic Metabolic Panel: Recent Labs  Lab 05/04/22 0922 05/05/22 0448 05/06/22 0421 05/07/22 0333  NA 136 137 138 139  K 2.8* 3.4* 3.1* 3.3*  CL 94* 98 101 103  CO2 33* 33* 31 29  GLUCOSE 126* 113* 85 81  BUN '23 21 18 14  '$ CREATININE 1.05* 0.91 0.92 0.74  CALCIUM 8.7* 8.5* 7.9* 8.3*  MG 1.9  --   --  1.9   GFR: Estimated Creatinine Clearance: 86.6 mL/min (by C-G formula based on SCr of 0.74 mg/dL). Liver Function Tests: Recent Labs  Lab 05/04/22 1345  AST 18  ALT 9  ALKPHOS 120  BILITOT 0.9  PROT 6.8  ALBUMIN 3.1*   Recent Labs  Lab 05/04/22 1345  LIPASE 21    No results for input(s): AMMONIA in the last 168 hours. Coagulation Profile: No results for input(s): INR, PROTIME in the last 168 hours. Cardiac Enzymes: No results for input(s): CKTOTAL, CKMB, CKMBINDEX, TROPONINI in the last 168 hours. BNP (last 3 results) Recent Labs    08/29/21 0931  PROBNP 180.0*   HbA1C: No results for input(s): HGBA1C in the last 72 hours. CBG: No results for input(s): GLUCAP in the last 168 hours. Lipid Profile: No results for input(s): CHOL, HDL, LDLCALC, TRIG, CHOLHDL, LDLDIRECT in the last 72 hours. Thyroid Function Tests: No results for input(s): TSH, T4TOTAL, FREET4, T3FREE, THYROIDAB in the last 72 hours. Anemia Panel: No results for input(s): VITAMINB12, FOLATE, FERRITIN, TIBC, IRON, RETICCTPCT in the last 72 hours. Sepsis Labs: Recent Labs  Lab 05/04/22 1345  LATICACIDVEN 1.4    No results found for this or any previous visit (from the past 240 hour(s)).       Radiology Studies: No results found.      Scheduled Meds:  (feeding supplement) PROSource Plus  30 mL Oral TID BM   atorvastatin  40 mg Oral Daily   carbidopa-levodopa  2 tablet Oral TID   enoxaparin (LOVENOX) injection  40 mg Subcutaneous Q24H   feeding supplement  1 Container Oral TID BM   fluticasone furoate-vilanterol  1 puff Inhalation Daily   multivitamin with minerals  1 tablet Oral Daily   pantoprazole (PROTONIX) IV  40 mg Intravenous Q12H   potassium chloride  40 mEq Oral BID   potassium chloride  40 mEq Oral Once   scopolamine  1 patch Transdermal Q72H   umeclidinium bromide  1 puff Inhalation Daily   Continuous Infusions:  sodium chloride 75 mL/hr at 05/06/22 2122     LOS: 2 days    Time spent: 27 mins     Wyvonnia Dusky, MD [facility] Pager [phone]  If 7PM-7AM, please contact night-coverage 05/07/2022, 7:46 AM

## 2022-05-07 NOTE — Progress Notes (Signed)
Medicated at this time with morphine/ benadryl/ and reglan.  Patient with dry heaves and nausea and vomiting.

## 2022-05-07 NOTE — Anesthesia Preprocedure Evaluation (Signed)
Anesthesia Evaluation  Patient identified by MRN, date of birth, ID band Patient awake    Reviewed: Allergy & Precautions, NPO status , Patient's Chart, lab work & pertinent test results  Airway Mallampati: II  TM Distance: >3 FB Neck ROM: full    Dental  (+) Edentulous Upper, Edentulous Lower   Pulmonary neg pulmonary ROS, COPD, former smoker,    Pulmonary exam normal  + decreased breath sounds      Cardiovascular Exercise Tolerance: Poor hypertension, Pt. on medications + DOE  negative cardio ROS Normal cardiovascular exam Rhythm:Regular     Neuro/Psych Anxiety Depression Hx of Parkinson's... CVA, No Residual Symptoms negative neurological ROS  negative psych ROS   GI/Hepatic negative GI ROS, Neg liver ROS, PUD, GERD  Poorly Controlled,  Endo/Other  negative endocrine ROS  Renal/GU negative Renal ROS  negative genitourinary   Musculoskeletal   Abdominal (+) + obese,   Peds negative pediatric ROS (+)  Hematology negative hematology ROS (+) Blood dyscrasia, anemia ,   Anesthesia Other Findings Past Medical History: No date: Allergy No date: Anemia No date: Anxiety No date: Arthritis No date: CHF (congestive heart failure) (HCC) No date: Depression No date: GERD (gastroesophageal reflux disease) No date: Hypertension No date: MVP (mitral valve prolapse) No date: Parkinson disease [facility])  Past Surgical History: No date: ABDOMINAL HYSTERECTOMY No date: APPENDECTOMY 04/21/2020: BALLOON DILATION; N/A     Comment:  Procedure: BALLOON DILATION;  Surgeon: Milus Banister,              MD;  Location: [facility];  Service: Endoscopy;                Laterality: N/A;  pyloric/ GI 07/04/2020: BALLOON DILATION; N/A     Comment:  Procedure: BALLOON DILATION;  Surgeon: Mauri Pole, MD;  Location: [facility];  Service: Endoscopy;                Laterality: N/A; 04/21/2020: BIOPSY     Comment:   Procedure: BIOPSY;  Surgeon: Milus Banister, MD;                Location: [facility];  Service: Endoscopy;; 07/04/2020: BIOPSY     Comment:  Procedure: BIOPSY;  Surgeon: Mauri Pole, MD;                Location: [facility];  Service: Endoscopy;;  EGD and               COLON 1988: BREAST SURGERY; Right     Comment:  tumor removal 05/18/2020: CHOLECYSTECTOMY; N/A     Comment:  Procedure: LAPAROSCOPIC CHOLECYSTECTOMY;  Surgeon:               Johnathan Hausen, MD;  Location: [facility];  Service:               General;  Laterality: N/A; 07/04/2020: COLONOSCOPY WITH PROPOFOL; N/A     Comment:  Procedure: COLONOSCOPY WITH PROPOFOL;  Surgeon:               Mauri Pole, MD;  Location: [facility];                Service: Endoscopy;  Laterality: N/A; 11/03/2021: COLONOSCOPY WITH PROPOFOL; N/A     Comment:  Procedure: COLONOSCOPY WITH PROPOFOL;  Surgeon:               Andrey Farmer  T, MD;  Location: [facility];                Service: Endoscopy;  Laterality: N/A; 08/11/2020: ESOPHAGEAL DILATION     Comment:  Procedure: PYLORIC DILATION;  Surgeon: Doran Stabler, MD;  Location: [facility];  Service:               Gastroenterology;; 04/21/2020: ESOPHAGOGASTRODUODENOSCOPY (EGD) WITH PROPOFOL; N/A     Comment:  Procedure: ESOPHAGOGASTRODUODENOSCOPY (EGD) WITH               PROPOFOL;  Surgeon: Milus Banister, MD;  Location: [facility];  Service: Endoscopy;  Laterality: N/A; 07/04/2020: ESOPHAGOGASTRODUODENOSCOPY (EGD) WITH PROPOFOL; N/A     Comment:  Procedure: ESOPHAGOGASTRODUODENOSCOPY (EGD) WITH               PROPOFOL;  Surgeon: Mauri Pole, MD;  Location:               [facility];  Service: Endoscopy;  Laterality: N/A; 08/11/2020: ESOPHAGOGASTRODUODENOSCOPY (EGD) WITH PROPOFOL; N/A     Comment:  Procedure: ESOPHAGOGASTRODUODENOSCOPY (EGD) WITH               PROPOFOL;  Surgeon: Doran Stabler, MD;  [facility]: [facility];  Service: Gastroenterology;  Laterality: N/A; 11/03/2021: ESOPHAGOGASTRODUODENOSCOPY (EGD) WITH PROPOFOL; N/A     Comment:  Procedure: ESOPHAGOGASTRODUODENOSCOPY (EGD) WITH               PROPOFOL;  Surgeon: Lesly Rubenstein, MD;  Location:               [facility];  Service: Endoscopy;  Laterality: N/A; 2005: KNEE SURGERY     Comment:  2 times left No date: KNEE SURGERY; Right 07/20/2021: KYPHOPLASTY; N/A     Comment:  Procedure: T 10KYPHOPLASTY;  Surgeon: Hessie Knows, MD;              Location: [facility];  Service: Orthopedics;  Laterality:               N/A; 08/23/2021: KYPHOPLASTY; N/A     Comment:  Procedure: T9 KYPHOPLASTY;  Surgeon: Hessie Knows, MD;               Location: [facility];  Service: Orthopedics;  Laterality:               N/A; 07/04/2020: POLYPECTOMY     Comment:  Procedure: POLYPECTOMY;  Surgeon: Mauri Pole,               MD;  Location: [facility];  Service: Endoscopy;; No date: TUBAL LIGATION  BMI    Body Mass Index: 26.39 kg/m      Reproductive/Obstetrics negative OB ROS                             Anesthesia Physical Anesthesia Plan  ASA: 3  Anesthesia Plan: General   Post-op Pain Management:    Induction: Intravenous  PONV Risk Score and Plan: Propofol infusion and TIVA  Airway Management Planned: Natural Airway  Additional Equipment:   Intra-op Plan:   Post-operative Plan:   Informed Consent: I have reviewed the patients History and Physical, chart, labs and  discussed the procedure including the risks, benefits and alternatives for the proposed anesthesia with the patient or authorized representative who has indicated his/her understanding and acceptance.     Dental Advisory Given  Plan Discussed with: CRNA and Surgeon  Anesthesia Plan Comments:         Anesthesia Quick Evaluation

## 2022-05-08 ENCOUNTER — Encounter: Payer: Self-pay | Admitting: Gastroenterology

## 2022-05-08 DIAGNOSIS — E876 Hypokalemia: Secondary | ICD-10-CM | POA: Diagnosis not present

## 2022-05-08 DIAGNOSIS — R110 Nausea: Secondary | ICD-10-CM

## 2022-05-08 DIAGNOSIS — R112 Nausea with vomiting, unspecified: Secondary | ICD-10-CM | POA: Diagnosis not present

## 2022-05-08 DIAGNOSIS — G20 Parkinson's disease: Secondary | ICD-10-CM | POA: Diagnosis not present

## 2022-05-08 LAB — BASIC METABOLIC PANEL
Anion gap: 6 (ref 5–15)
BUN: 11 mg/dL (ref 8–23)
CO2: 32 mmol/L (ref 22–32)
Calcium: 8.3 mg/dL — ABNORMAL LOW (ref 8.9–10.3)
Chloride: 98 mmol/L (ref 98–111)
Creatinine, Ser: 0.76 mg/dL (ref 0.44–1.00)
GFR, Estimated: >60 mL/min (ref >60)
Glucose, Bld: 98 mg/dL (ref 70–99)
Potassium: 3.4 mmol/L — ABNORMAL LOW (ref 3.5–5.1)
Sodium: 136 mmol/L (ref 135–145)

## 2022-05-08 LAB — CBC
HCT: 27.8 % — ABNORMAL LOW (ref 36.0–46.0)
Hemoglobin: 8.7 g/dL — ABNORMAL LOW (ref 12.0–15.0)
MCH: 24.9 pg — ABNORMAL LOW (ref 26.0–34.0)
MCHC: 31.3 g/dL (ref 30.0–36.0)
MCV: 79.4 fL — ABNORMAL LOW (ref 80.0–100.0)
Platelets: 251 10*3/uL (ref 150–400)
RBC: 3.5 MIL/uL — ABNORMAL LOW (ref 3.87–5.11)
RDW: 14.2 % (ref 11.5–15.5)
WBC: 5.8 10*3/uL (ref 4.0–10.5)
nRBC: 0 % (ref 0.0–0.2)

## 2022-05-08 LAB — MAGNESIUM: Magnesium: 1.9 mg/dL (ref 1.7–2.4)

## 2022-05-08 LAB — SURGICAL PATHOLOGY

## 2022-05-08 MED ORDER — PANTOPRAZOLE SODIUM 40 MG PO TBEC
40.0000 mg | DELAYED_RELEASE_TABLET | Freq: Two times a day (BID) | ORAL | Status: DC
  Administered 2022-05-09 – 2022-05-10 (×3): 40 mg via ORAL
  Filled 2022-05-08 (×3): qty 1

## 2022-05-08 NOTE — Progress Notes (Signed)
Morgan Darby, MD [address]  [place]  [place], [place] [address]  Main: [phone]  Fax: [phone] Pager: [phone]   Subjective: No acute events overnight.  Patient is tolerating full liquid diet today.  She reports some nausea and epigastric pain.  She underwent EGD yesterday with no evidence of pyloric stenosis.  She is started on Reglan 5 mg before each meal and at bedtime   Objective: Vital signs in last 24 hours: Vitals:   05/08/22 0753 05/08/22 1027 05/08/22 1223 05/08/22 1540  BP: 129/69  120/70 131/72  Pulse: 68  64 65  Resp: '17  18 18  '$ Temp: 98.1 F (36.7 C)  97.8 F (36.6 C) 98 F (36.7 C)  TempSrc:      SpO2: 98% 98% 98% 99%  Weight:      Height:       Weight change:   Intake/Output Summary (Last 24 hours) at 05/08/2022 1917 Last data filed at 05/08/2022 1759 Gross per 24 hour  Intake 720 ml  Output 2200 ml  Net -1480 ml     Exam: Heart:: Regular rate and rhythm, S1S2 present, or without murmur or extra heart sounds Lungs: normal and clear to auscultation Abdomen: soft, nontender, normal bowel sounds   Lab Results:    Latest Ref Rng & Units 05/08/2022    4:26 AM 05/07/2022    3:33 AM 05/06/2022    4:21 AM  CBC  WBC 4.0 - 10.5 K/uL 5.8   5.5   5.2    Hemoglobin 12.0 - 15.0 g/dL 8.7   8.1   8.4    Hematocrit 36.0 - 46.0 % 27.8   26.2   27.2    Platelets 150 - 400 K/uL 251   267   301        Latest Ref Rng & Units 05/08/2022    4:26 AM 05/07/2022    3:33 AM 05/06/2022    4:21 AM  CMP  Glucose 70 - 99 mg/dL 98   81   85    BUN 8 - 23 mg/dL '11   14   18    '$ Creatinine 0.44 - 1.00 mg/dL 0.76   0.74   0.92    Sodium 135 - 145 mmol/L 136   139   138    Potassium 3.5 - 5.1 mmol/L 3.4   3.3   3.1    Chloride 98 - 111 mmol/L 98   103   101    CO2 22 - 32 mmol/L 32   29   31    Calcium 8.9 - 10.3 mg/dL 8.3   8.3   7.9      Micro Results: No results found for this or any previous visit (from the past 240  hour(s)). Studies/Results: No results found. Medications: I have reviewed the patient's current medications. Prior to Admission:  Medications Prior to Admission  Medication Sig Dispense Refill Last Dose   acetaminophen (TYLENOL) 325 MG tablet Take 2 tablets (650 mg total) by mouth every 4 (four) hours as needed for mild pain (or temp > 37.5 C (99.5 F)).    at prn   atorvastatin (LIPITOR) 40 MG tablet TAKE 1 TABLET BY MOUTH EVERY DAY 90 tablet 1 Past Week   Budeson-Glycopyrrol-Formoterol (BREZTRI AEROSPHERE) 160-9-4.8 MCG/ACT AERO Inhale 2 puffs into the lungs in the morning and at bedtime. 10.7 g 3 Past Month   carbidopa-levodopa (SINEMET IR) 25-100 MG tablet Take 2 tablets by mouth 3 (  three) times daily.   Past Week   fluticasone (FLONASE) 50 MCG/ACT nasal spray Place 1 spray into both nostrils daily.  2  at prn   hydrOXYzine (ATARAX/VISTARIL) 25 MG tablet Take 25 mg by mouth every 8 (eight) hours as needed for anxiety.   Past Week   mirtazapine (REMERON) 15 MG tablet Take 15 mg by mouth at bedtime.   Past Week   AIMOVIG 140 MG/ML SOAJ SMARTSIG:140 Milligram(s) SUB-Q Once a Month   04/16/2022   aspirin-acetaminophen-caffeine (EXCEDRIN MIGRAINE) 250-250-65 MG tablet Take 1-2 tablets by mouth every 6 (six) hours as needed for headache.    at prn   carvedilol (COREG) 12.5 MG tablet Take 6.25 mg by mouth daily. (Patient not taking: Reported on 05/04/2022)   Not Taking   Cholecalciferol 1.25 MG (50000 UT) TABS Take 1 tablet by mouth once a week. (Patient not taking: Reported on 05/04/2022) 4 tablet 1 Not Taking   divalproex (DEPAKOTE) 500 MG DR tablet Take 1 tablet (500 mg total) by mouth every 12 (twelve) hours. (Patient not taking: Reported on 05/04/2022)   Not Taking   FLUoxetine (PROZAC) 40 MG capsule Take 40 mg by mouth daily. (Patient not taking: Reported on 05/04/2022)   Not Taking   furosemide (LASIX) 40 MG tablet Take 1 tablet (40 mg total) by mouth daily. (Patient not taking: Reported on  05/04/2022) 90 tablet 1 Not Taking   gabapentin (NEURONTIN) 400 MG capsule Take 400 mg by mouth 3 (three) times daily as needed (for nerve pain). (Patient not taking: Reported on 05/04/2022)   Not Taking at 2400   montelukast (SINGULAIR) 10 MG tablet Take 1 tablet (10 mg total) by mouth at bedtime. (Patient not taking: Reported on 05/04/2022) 30 tablet 11 Not Taking   omeprazole (PRILOSEC) 40 MG capsule TAKE 1 CAPSULE BY MOUTH EVERY DAY (Patient not taking: Reported on 05/04/2022) 90 capsule 3 Not Taking at prn   ondansetron (ZOFRAN-ODT) 4 MG disintegrating tablet TAKE 1 TO 2 TABLETS BY MOUTH EVERY 6 HOURS AS NEEDED**PLEASE CONTACT OFFICE TO SCHEDULED FOLLOW UP 360 tablet 0  at prn   OXYGEN Inhale 3 L/min into the lungs See admin instructions. 3 L/min at bedtime and as needed for shortness of breath throughout the day      valsartan (DIOVAN) 320 MG tablet Take 320 mg by mouth daily. (Patient not taking: Reported on 05/04/2022)   Not Taking   Scheduled:  atorvastatin  40 mg Oral Daily   carbidopa-levodopa  2 tablet Oral TID   enoxaparin (LOVENOX) injection  40 mg Subcutaneous Q24H   feeding supplement  1 Container Oral TID BM   fluticasone furoate-vilanterol  1 puff Inhalation Daily   metoCLOPramide  5 mg Oral TID AC & HS   multivitamin with minerals  1 tablet Oral Daily   [START ON 05/09/2022] pantoprazole  40 mg Oral BID AC   scopolamine  1 patch Transdermal Q72H   umeclidinium bromide  1 puff Inhalation Daily   Continuous: TDH:RCBULAGTXMIWO, albuterol, aspirin-acetaminophen-caffeine, bisacodyl, diphenhydrAMINE, docusate sodium, hydrALAZINE, hydrOXYzine, morphine injection, oxyCODONE-acetaminophen Anti-infectives (From admission, onward)    None      Scheduled Meds:  atorvastatin  40 mg Oral Daily   carbidopa-levodopa  2 tablet Oral TID   enoxaparin (LOVENOX) injection  40 mg Subcutaneous Q24H   feeding supplement  1 Container Oral TID BM   fluticasone furoate-vilanterol  1 puff  Inhalation Daily   metoCLOPramide  5 mg Oral TID AC & HS   multivitamin with  minerals  1 tablet Oral Daily   [START ON 05/09/2022] pantoprazole  40 mg Oral BID AC   scopolamine  1 patch Transdermal Q72H   umeclidinium bromide  1 puff Inhalation Daily   Continuous Infusions: PRN Meds:.acetaminophen, albuterol, aspirin-acetaminophen-caffeine, bisacodyl, diphenhydrAMINE, docusate sodium, hydrALAZINE, hydrOXYzine, morphine injection, oxyCODONE-acetaminophen   Assessment: Principal Problem:   Nausea & vomiting Active Problems:   Hypokalemia   Essential hypertension   Depression   HLD (hyperlipidemia)   GERD (gastroesophageal reflux disease)   Chronic diastolic CHF (congestive heart failure) (HCC)   COPD (chronic obstructive pulmonary disease) (HCC)   Stage 3a chronic kidney disease (HCC)   Hemiplegic migraine   Epigastric abdominal pain   Parkinson disease (HCC)   Near syncope   Normocytic anemia   Abdominal pain   Nausea   Intractable nausea and vomiting   Gastric erythema  71 year old female with multiple comorbidities with history of chronic nausea and vomiting, is admitted with recurrent episodes of nausea and vomiting.  S/p EGD on 5/22 which revealed mild gastric erythema only.  There is no evidence of pyloric stenosis.  Gastric biopsies were unremarkable, no evidence of H. pylori.  Plan: Nausea and vomiting: Patient is able to tolerate full liquids today without any episodes of emesis S/p EGD on 5/22, unremarkable, gastric biopsies negative for H. pylori Celiac disease panel in process Continue Reglan 5 mg before each meal and at bedtime and as outpatient with close follow-up with GI upon discharge Recommend small frequent meals, low-carb, low-fat diet Advance diet as tolerated Protonix 40 mg p.o. twice daily Consider to add Zyprexa at bedtime if nausea is persistent No further recommendations from GI standpoint, GI will sign off at this time, please call us back with  questions or concerns   LOS: 3 days   Nakeita Styles 05/08/2022, 7:17 PM

## 2022-05-08 NOTE — Evaluation (Signed)
Occupational Therapy Evaluation Patient Details Name: Morgan [name] MRN: [id number] DOB: 10-31-1951 Today's Date: 05/08/2022   History of Present Illness 71 y.o. female with medical history significant of, PUD, gastritis, duodenitis, hemiplegic migraine with mild left-sided weakness, hypertension, hyperlipidemia, COPD on 3 L oxygen, GERD, depression, anxiety, Parkinson's disease, CHF, anemia, CKD-3A who presents to [facility] ED on 5/19 with epigastric abdominal pain, intractable nausea and dry heaves, near syncope. EGD on 5/22.   Clinical Impression   Pt was seen for OT evaluation this date. Prior to hospital admission, pt reports being independent with mobility and basic ADL tasks, driving, and med mgt. She lives with her grandson and her grandson's great grandmother and great grandfather in a 1 story ramped home. She denies falls in past 50mo however, prior assessment during previous admission ~340mogo indicates she had at least 3 falls. Currently pt demonstrates impairments as described below (See OT problem list) which functionally limit her ability to perform ADL/self-care tasks. Pt currently requires increased time/effort to complete bed mobility, stood with CGA + RW, and unable to tolerate standing for >64m45mfor grooming completion and completion of orthostatic vitals due to increasing wooziness and queasiness. Partial orthostatic vitals taken and no clinically significant drop in BP with positional changes noted, however, pt symptomatic. Returned to bed. SpO2 >98% on 2L throughout session. Pt able to don socks while seated EOB without direct assist, anticipate need for at last CGA for LB dressing involving ADL transfers. Pt reliant on RW for UE support while in standing 2/2 pt reporting decreased balance. Pt would benefit from skilled OT services to address noted impairments and functional limitations (see below for any additional details) in order to maximize safety and independence while minimizing  falls risk and caregiver burden. Upon hospital discharge, recommend STR to maximize pt safety and return to PLOF. Pt hopeful that with medical improvement she will functionally improve as well with hopes to return home.     Recommendations for follow up therapy are one component of a multi-disciplinary discharge planning process, led by the attending physician.  Recommendations may be updated based on patient status, additional functional criteria and insurance authorization.   Follow Up Recommendations  Skilled nursing-short term rehab (<3 hours/day)    Assistance Recommended at Discharge Frequent or constant Supervision/Assistance  Patient can return home with the following A little help with walking and/or transfers;A little help with bathing/dressing/bathroom;Assistance with cooking/housework;Assist for transportation;Direct supervision/assist for medications management;Help with stairs or ramp for entrance    Functional Status Assessment  Patient has had a recent decline in their functional status and demonstrates the ability to make significant improvements in function in a reasonable and predictable amount of time.  Equipment Recommendations  Other (comment) (2WW)    Recommendations for Other Services       Precautions / Restrictions Precautions Precautions: Fall Restrictions Weight Bearing Restrictions: No      Mobility Bed Mobility Overal bed mobility: Modified Independent             General bed mobility comments: increased time/effort but able to complete without direct assist    Transfers Overall transfer level: Needs assistance Equipment used: Rolling walker (2 wheels) Transfers: Sit to/from Stand Sit to Stand: Min guard                  Balance Overall balance assessment: Needs assistance Sitting-balance support: Single extremity supported, Feet supported Sitting balance-Leahy Scale: Fair     Standing balance support: Bilateral upper extremity  supported,  Single extremity supported, Reliant on assistive device for balance Standing balance-Leahy Scale: Fair Standing balance comment: Pt unable to tolerate standing without light UE Support                           ADL either performed or assessed with clinical judgement   ADL Overall ADL's : Needs assistance/impaired                                       General ADL Comments: Pt able to don socks seated EOB with mod indep, CGA-MIN A for LB dressing involving STS transfers, and unable to tolerate standing >65mn for standing ADL tasks 2/2 queasiness and wooziness     Vision         Perception     Praxis      Pertinent Vitals/Pain Pain Assessment Pain Assessment: No/denies pain     Hand Dominance Left   Extremity/Trunk Assessment Upper Extremity Assessment Upper Extremity Assessment: Generalized weakness   Lower Extremity Assessment Lower Extremity Assessment: Generalized weakness   Cervical / Trunk Assessment Cervical / Trunk Assessment: Normal   Communication     Cognition Arousal/Alertness: Awake/alert Behavior During Therapy: WFL for tasks assessed/performed Overall Cognitive Status: Within Functional Limits for tasks assessed                                       General Comments       Exercises     Shoulder Instructions      Home Living Family/patient expects to be discharged to:: Private residence Living Arrangements: Non-relatives/Friends;Other relatives (grandson, grandson's great grandmother and great grandfather) Available Help at Discharge: Family Type of Home: House Home Access: Ramped entrance     Home Layout: One level     Bathroom Shower/Tub: Walk-in shower;Tub/shower unit   Bathroom Toilet: Handicapped height     Home Equipment: Rollator (4 wheels);Cane - single point;Shower seat - built in          Prior Functioning/Environment Prior Level of Function : History of Falls (last  six months);Driving;Independent/Modified Independent             Mobility Comments: Pt reports indep with mobility ADLs Comments: Indep with basic ADL, driving, med mgt, wears 2-4L O2 at home, denies falls in past 629mobut prior chart review (~36m74moo) reveals at least 3 falls. Other people in the house do majority of cooking and cleaning        OT Problem List: Decreased strength;Decreased activity tolerance;Impaired balance (sitting and/or standing);Decreased knowledge of use of DME or AE      OT Treatment/Interventions: Self-care/ADL training;Therapeutic exercise;Therapeutic activities;Energy conservation;DME and/or AE instruction;Patient/family education;Balance training    OT Goals(Current goals can be found in the care plan section) Acute Rehab OT Goals Patient Stated Goal: feel better and go home OT Goal Formulation: With patient Time For Goal Achievement: 05/22/22 Potential to Achieve Goals: Good ADL Goals Pt Will Perform Lower Body Dressing: with modified independence;sit to/from stand Pt Will Transfer to Toilet: with supervision;ambulating (LRAD) Pt Will Perform Toileting - Clothing Manipulation and hygiene: with modified independence Additional ADL Goal #1: Pt will tolerate completing grooming tasks while standing at a sink >/=5mi27mithout symptoms, 3/3 opportunities.  OT Frequency: Min 2X/week    Co-evaluation  AM-PAC OT "6 Clicks" Daily Activity     Outcome Measure Help from another person eating meals?: None Help from another person taking care of personal grooming?: A Little Help from another person toileting, which includes using toliet, bedpan, or urinal?: A Little Help from another person bathing (including washing, rinsing, drying)?: A Little Help from another person to put on and taking off regular upper body clothing?: A Little Help from another person to put on and taking off regular lower body clothing?: A Little 6 Click Score: 19    End of Session Equipment Utilized During Treatment: Rolling walker (2 wheels);Gait belt;Oxygen Nurse Communication: Mobility status  Activity Tolerance: Other (comment) (woozy, queasy) Patient left: in bed;with call bell/phone within reach;with bed alarm set  OT Visit Diagnosis: Other abnormalities of gait and mobility (R26.89);Repeated falls (R29.6);Muscle weakness (generalized) (M62.81)                Time: 7972-8206 OT Time Calculation (min): 23 min Charges:  OT General Charges $OT Visit: 1 Visit OT Evaluation $OT Eval Moderate Complexity: 1 Mod OT Treatments $Therapeutic Activity: 8-22 mins  Ardeth Perfect., MPH, MS, OTR/L ascom [phone] 05/08/22, 10:34 AM

## 2022-05-08 NOTE — Progress Notes (Addendum)
Nutrition Follow-up  DOCUMENTATION CODES:   Not applicable  INTERVENTION:   -Continue MVI with minerals daily -Continue Boost Breeze po TID, each supplement provides 250 kcal and 9 grams of protein  -Continue 30 ml Prosource Plus TID, each supplement provides 100 kcals and 15 grams protein  NUTRITION DIAGNOSIS:   Inadequate oral intake related to nausea, vomiting as evidenced by per patient/family report, meal completion < 25%.  Ongoing  GOAL:   Patient will meet greater than or equal to 90% of their needs  Progressing   MONITOR:   PO intake, Supplement acceptance, Diet advancement  REASON FOR ASSESSMENT:   Malnutrition Screening Tool    ASSESSMENT:   Pt with PMH of hypertension, hyperlipidemia, COPD on 3 L oxygen, GERD, depression, anxiety, Parkinson's disease, CHF, anemia, CKD-3A who presents with epigastric abdominal pain, intractable nausea and dry heaves, near syncope.  5/22- s/p flex sig- revealed erythematous mucosa in the antrum (biopsied), normal gastric body, incisura, prepyloric region of stomach and pylorus (biopsied)  Reviewed I/O's: -187 ml x 24 hours and +3.9 L since admission  UOP: 1.7 L x 24 hours   Spoke with pt at bedside, who reports that she is feeling better since her procedure yesterday. She is tolerating full liquid diet well and consumed soup, jello, and ice pop for lunch. Pt shares that she was unable to keep foods and liquids down for several days PTA. When she is in her usual state of health, pt consumes 1 large meal (meat, starch, and vegetable) and 2 smaller meals (soup and sandwich) daily.   Pt reports as 15# wt loss over the past few months. Her UBW is around 200#. Reviewed wt hx;pt has experienced a 5.3% wt loss over the past 2 months, which is not significant for time frame.   Discussed importance of good meal and supplement intake to promote healing. Pt does not like Ensure supplements, but willing to continue Boost Breeze.    Medications reviewed and include sinemet and reglan.   Labs reviewed: K: 3.4.    NUTRITION - FOCUSED PHYSICAL EXAM:  Flowsheet Row Most Recent Value  Orbital Region No depletion  Upper Arm Region No depletion  Thoracic and Lumbar Region No depletion  Buccal Region No depletion  Temple Region No depletion  Clavicle Bone Region No depletion  Clavicle and Acromion Bone Region No depletion  Scapular Bone Region No depletion  Dorsal Hand No depletion  Patellar Region No depletion  Anterior Thigh Region No depletion  Posterior Calf Region No depletion  Edema (RD Assessment) None  Hair Reviewed  Eyes Reviewed  Mouth Reviewed  Skin Reviewed  Nails Reviewed       Diet Order:   Diet Order             Diet full liquid Room service appropriate? Yes; Fluid consistency: Thin  Diet effective now                   EDUCATION NEEDS:   No education needs have been identified at this time  Skin:  Skin Assessment: Reviewed RN Assessment  Last BM:  05/05/22  Height:   Ht Readings from Last 1 Encounters:  05/04/22 '6\' 1"'$  (1.854 m)    Weight:   Wt Readings from Last 1 Encounters:  05/04/22 90.7 kg    Ideal Body Weight:  86.4 kg  BMI:  Body mass index is 26.39 kg/m.  Estimated Nutritional Needs:   Kcal:  2150-2350  Protein:  115-130 grams  Fluid:  >  [place], RD, LDN, [place] Registered Dietitian II Certified Diabetes Care and Education Specialist Please refer to [facility] for RD and/or RD on-call/weekend/after hours pager

## 2022-05-08 NOTE — Progress Notes (Signed)
PROGRESS NOTE   HPI was taken from Dr. Blaine Hamper: Morgan [name] is a 71 y.o. female with medical history significant of, PUD, gastritis, duodenitis, hemiplegic migraine with mild left-sided weakness, hypertension, hyperlipidemia, COPD on 3 L oxygen, GERD, depression, anxiety, Parkinson's disease, CHF, anemia, CKD-3A who presents with epigastric abdominal pain, intractable nausea and dry heaves, near syncope.   Patient states that she has severe nausea, dry heaves and epigastric abdominal pain for more than a week.  She states that her nausea and dry heaves are constant and intractable.  Her epigastric abdominal pain is sharp, 8 out of 10 in severity, constant, nonradiating.  No diarrhea.  No fever or chills.  She states that she has lightheadedness and dizziness, feels like she is going to pass out, but did not.  No fall.  No injury.  Denies symptoms of UTI.   Data Reviewed and ED Course: pt was found to have WBC 6.6, lipase 21, liver function normal, troponin 5, potassium 2.8, stable renal function, temperature normal, blood pressure 162/96, heart rate 91, RR 20, oxygen saturation 98% on 3-4 L oxygen, CT of abdomen/pelvis showed sigmoid diverticulosis, otherwise negative for acute findings.  Patient is placed on telemetry bed for observation.  Dr. Allen Norris of GI is consulted.    As per Dr. Jimmye Norman 5/20-5/23/23:  Pt presented w/ nausea and vomiting/dry heaving of unknown etiology. CT abd/pelvis was neg for any acute findings. Pt is s/p EGD which was normal & biospies were taken. Celiac panel ordered as per GI. Pt is still c/o nausea & dry heaving. Pt does have hx of pyloric stenosis    Morgan [name]  LPF:[id number] DOB: Dec 15, 1951 DOA: 05/04/2022 PCP: [name], MD   Assessment & Plan:   Principal Problem:   Nausea & vomiting Active Problems:   Epigastric abdominal pain   Hypokalemia   Essential hypertension   Depression   HLD (hyperlipidemia)   GERD (gastroesophageal reflux disease)    Chronic diastolic CHF (congestive heart failure) (HCC)   COPD (chronic obstructive pulmonary disease) (HCC)   Stage 3a chronic kidney disease (HCC)   Hemiplegic migraine   Parkinson disease ([place])   Near syncope   Normocytic anemia   Abdominal pain   Nausea   Intractable nausea and vomiting   Gastric erythema  Assessment and Plan: Nausea & vomiting: etiology unclear, CT abd/pelvis is neg for any acute findings. Hx of pyloric stenosis.Still w/ nausea & dry heaves. Continue on scopolamine patch & reglan. S/p EGD which was normal & biopsies were taken. Celiac panel ordered as per GI   Normocytic anemia: H&H are labile. No need for a transfusion currently    Near syncope: likely secondary to dehydration & orthostatic.    Parkinson disease: continue on home dose of sinemet    Hemiplegic migraine: excedrin prn. Depakote was d/c by pt's physician    CKDIIIa: Cr is better than baseline    COPD: w/o exacerbation. Continue on supplemental oxygen, uses 3L [place] at home   Chronic diastolic CHF: echo on 04/19/2991 showed EF of 60 to 65% with grade 1 diastolic dysfunction.CHF seems compensated. Hold lasix    GERD: continue on PPI    HLD: continue on statin    Depression: severity unknown. Pt stopped taking fluoxetine    HTN:  BP is WNL.     Hypokalemia: potassium given   Thrombocytosis: resolved   Chronic respiratory failure: continue on supplemental oxygen, uses 3L [place]    DVT prophylaxis: lovenox  Code Status:  full  Family Communication:  Disposition Plan: possibly d/c to SNF   Level of care: Telemetry Cardiac  Status is: Inpatient Remains inpatient appropriate because: severity of illness     Consultants:  GI   Procedures:   Antimicrobials:    Subjective: Pt c/o dry heaving& nausea   Objective: Vitals:   05/07/22 1603 05/07/22 1944 05/07/22 2341 05/08/22 0406  BP: 130/69 130/73 (!) 115/94 126/65  Pulse: 73 70 69 64  Resp: '17 18 18 17  '$ Temp: 98.3 F (36.8 C)  99.1 F (37.3 C) 98.4 F (36.9 C) 98.1 F (36.7 C)  TempSrc: Oral   Oral  SpO2: 96% 100% 98% 97%  Weight:      Height:        Intake/Output Summary (Last 24 hours) at 05/08/2022 0745 Last data filed at 05/08/2022 0406 Gross per 24 hour  Intake 1512.6 ml  Output 1700 ml  Net -187.4 ml   Filed Weights   05/04/22 0915  Weight: 90.7 kg    Examination:  General exam: Appears comfortable    Respiratory system: decreased breath sounds b/l  Cardiovascular system: S1 & S2+. No rubs or gallops  Gastrointestinal system: Abd is soft, NT, ND & hypoactive bowel sounds  Central nervous system: alert and oriented. Moves all extremities Psychiatry: judgement and insight appear at baseline. Flat mood and affect      Data Reviewed: I have personally reviewed following labs and imaging studies  CBC: Recent Labs  Lab 05/04/22 0922 05/06/22 0421 05/07/22 0333 05/08/22 0426  WBC 6.6 5.2 5.5 5.8  HGB 10.6* 8.4* 8.1* 8.7*  HCT 34.0* 27.2* 26.2* 27.8*  MCV 80.2 80.0 79.2* 79.4*  PLT 407* 301 267 353   Basic Metabolic Panel: Recent Labs  Lab 05/04/22 0922 05/05/22 0448 05/06/22 0421 05/07/22 0333 05/08/22 0426  NA 136 137 138 139 136  K 2.8* 3.4* 3.1* 3.3* 3.4*  CL 94* 98 101 103 98  CO2 33* 33* 31 29 32  GLUCOSE 126* 113* 85 81 98  BUN '23 21 18 14 11  '$ CREATININE 1.05* 0.91 0.92 0.74 0.76  CALCIUM 8.7* 8.5* 7.9* 8.3* 8.3*  MG 1.9  --   --  1.9 1.9   GFR: Estimated Creatinine Clearance: 86.6 mL/min (by C-G formula based on SCr of 0.76 mg/dL). Liver Function Tests: Recent Labs  Lab 05/04/22 1345  AST 18  ALT 9  ALKPHOS 120  BILITOT 0.9  PROT 6.8  ALBUMIN 3.1*   Recent Labs  Lab 05/04/22 1345  LIPASE 21   No results for input(s): AMMONIA in the last 168 hours. Coagulation Profile: No results for input(s): INR, PROTIME in the last 168 hours. Cardiac Enzymes: No results for input(s): CKTOTAL, CKMB, CKMBINDEX, TROPONINI in the last 168 hours. BNP (last 3  results) Recent Labs    08/29/21 0931  PROBNP 180.0*   HbA1C: No results for input(s): HGBA1C in the last 72 hours. CBG: No results for input(s): GLUCAP in the last 168 hours. Lipid Profile: No results for input(s): CHOL, HDL, LDLCALC, TRIG, CHOLHDL, LDLDIRECT in the last 72 hours. Thyroid Function Tests: No results for input(s): TSH, T4TOTAL, FREET4, T3FREE, THYROIDAB in the last 72 hours. Anemia Panel: No results for input(s): VITAMINB12, FOLATE, FERRITIN, TIBC, IRON, RETICCTPCT in the last 72 hours. Sepsis Labs: Recent Labs  Lab 05/04/22 1345  LATICACIDVEN 1.4    No results found for this or any previous visit (from the past 240 hour(s)).       Radiology Studies:  No results found.      Scheduled Meds:  atorvastatin  40 mg Oral Daily   carbidopa-levodopa  2 tablet Oral TID   enoxaparin (LOVENOX) injection  40 mg Subcutaneous Q24H   feeding supplement  1 Container Oral TID BM   fluticasone furoate-vilanterol  1 puff Inhalation Daily   metoCLOPramide  5 mg Oral TID AC & HS   multivitamin with minerals  1 tablet Oral Daily   pantoprazole (PROTONIX) IV  40 mg Intravenous Q12H   scopolamine  1 patch Transdermal Q72H   umeclidinium bromide  1 puff Inhalation Daily   Continuous Infusions:     LOS: 3 days    Time spent: 25 mins     Wyvonnia Dusky, MD [facility] Pager [phone]  If 7PM-7AM, please contact night-coverage 05/08/2022, 7:45 AM

## 2022-05-08 NOTE — Evaluation (Signed)
Physical Therapy Evaluation Patient Details Name: Morgan [name] MRN: [id number] DOB: 1951/01/10 Today's Date: 05/08/2022  History of Present Illness  Pt is a 71 y.o. female with medical history significant of PUD, gastritis, duodenitis, hemiplegic migraine with mild left-sided weakness, hypertension, hyperlipidemia, COPD on 3L oxygen, GERD, depression, anxiety, Parkinson's disease, CHF, anemia, CKD-3A who presents to [facility] ED on 5/19 with epigastric abdominal pain, intractable nausea and dry heaves, near syncope. EGD on 5/22. MD assessment includes: nausea & vomiting with etiology unclear, anemia, near syncope, and hypokalemia.   Clinical Impression  Pt was pleasant and motivated to participate during the session and put forth good effort throughout. Pt required significantly increased time and effort with all functional tasks but no physical assistance.  Pt training provided on log roll technique with bed mobility secondary to pt c/o 5/10 abdominal pain at onset of session.  Pt reported no adverse symptoms in sitting other than abdominal pain and fatigue.  Pt was only able to take a few effortful steps once in standing before needing to return to sitting secondary to fatigue with SpO2 and HR WNL.  Of note, pt reported using 3.5-4LO2/min at home but with no way to measure her SpO2.  MD and nursing notified that pt's SpO2 on 2LO2/min remained 99-100% throughout the session.  Pt will benefit from PT services in a SNF setting upon discharge to safely address deficits listed in patient problem list for decreased caregiver assistance and eventual return to PLOF.         Recommendations for follow up therapy are one component of a multi-disciplinary discharge planning process, led by the attending physician.  Recommendations may be updated based on patient status, additional functional criteria and insurance authorization.  Follow Up Recommendations Skilled nursing-short term rehab (<3 hours/day)     Assistance Recommended at Discharge Frequent or constant Supervision/Assistance  Patient can return home with the following  A lot of help with walking and/or transfers;A lot of help with bathing/dressing/bathroom;Assistance with cooking/housework;Assist for transportation;Help with stairs or ramp for entrance    Equipment Recommendations Rolling walker (2 wheels)  Recommendations for Other Services       Functional Status Assessment Patient has had a recent decline in their functional status and demonstrates the ability to make significant improvements in function in a reasonable and predictable amount of time.     Precautions / Restrictions Precautions Precautions: Fall Restrictions Weight Bearing Restrictions: No      Mobility  Bed Mobility Overal bed mobility: Modified Independent             General bed mobility comments: Log roll training secondary to abdominal pain at rest with pt able to perform rolling and sidelying to sit with exta time and effort only    Transfers Overall transfer level: Needs assistance Equipment used: Rolling walker (2 wheels) Transfers: Sit to/from Stand Sit to Stand: Min guard, From elevated surface           General transfer comment: Extra time and effort to come to full standing but no physical assist needed    Ambulation/Gait Ambulation/Gait assistance: Min guard Gait Distance (Feet): 3 Feet Assistive device: Rolling walker (2 wheels) Gait Pattern/deviations: Step-through pattern, Decreased step length - right, Decreased step length - left Gait velocity: decreased     General Gait Details: Pt only able to take several small steps at the EOB and from bed to chair before needing to return to sitting secondary to fatigue  Stairs  Wheelchair Mobility    Modified Rankin (Stroke Patients Only)       Balance Overall balance assessment: Needs assistance Sitting-balance support: Single extremity supported,  Feet supported Sitting balance-Leahy Scale: Good     Standing balance support: Bilateral upper extremity supported, Reliant on assistive device for balance, During functional activity Standing balance-Leahy Scale: Fair                               Pertinent Vitals/Pain Pain Assessment Pain Assessment: 0-10 Pain Score: 5  Pain Location: abdominal pain Pain Descriptors / Indicators: Aching Pain Intervention(s): Repositioned, Premedicated before session, Monitored during session    Home Living Family/patient expects to be discharged to:: Private residence Living Arrangements: Non-relatives/Friends;Other relatives (grandson, grandson's great grandmother and great grandfather) Available Help at Discharge: Family;Available 24 hours/day Type of Home: House Home Access: Ramped entrance       Home Layout: One level Home Equipment: Rollator (4 wheels);Cane - single point;Shower seat - built in;Grab bars - tub/shower;Grab bars - toilet      Prior Function               Mobility Comments: Ind amb community distances without an AD, no fall history ADLs Comments: Ind with ADLs     Hand Dominance   Dominant Hand: Left    Extremity/Trunk Assessment   Upper Extremity Assessment Upper Extremity Assessment: Overall WFL for tasks assessed    Lower Extremity Assessment Lower Extremity Assessment: Overall WFL for tasks assessed       Communication   Communication: No difficulties  Cognition Arousal/Alertness: Awake/alert Behavior During Therapy: WFL for tasks assessed/performed Overall Cognitive Status: Within Functional Limits for tasks assessed                                          General Comments      Exercises Total Joint Exercises Ankle Circles/Pumps: Strengthening, Both, 10 reps Quad Sets: Strengthening, Both, 10 reps Gluteal Sets: Strengthening, Both, 10 reps Long Arc Quad: Strengthening, Both, 10 reps Knee Flexion:  Strengthening, Both, 10 reps Marching in Standing: Strengthening, Both, 5 reps, Standing Other Exercises Other Exercises: HEP education for BLE APs, QS, GS, and LAQs   Assessment/Plan    PT Assessment Patient needs continued PT services  PT Problem List Decreased strength;Decreased activity tolerance;Decreased balance;Decreased mobility;Decreased knowledge of use of DME       PT Treatment Interventions DME instruction;Gait training;Functional mobility training;Therapeutic activities;Therapeutic exercise;Balance training;Patient/family education    PT Goals (Current goals can be found in the Care Plan section)  Acute Rehab PT Goals Patient Stated Goal: To get stronger PT Goal Formulation: With patient Time For Goal Achievement: 05/21/22 Potential to Achieve Goals: Good    Frequency Min 2X/week     Co-evaluation               AM-PAC PT "6 Clicks" Mobility  Outcome Measure Help needed turning from your back to your side while in a flat bed without using bedrails?: A Little Help needed moving from lying on your back to sitting on the side of a flat bed without using bedrails?: A Little Help needed moving to and from a bed to a chair (including a wheelchair)?: A Little Help needed standing up from a chair using your arms (e.g., wheelchair or bedside chair)?: A Little Help needed to walk  in hospital room?: A Lot Help needed climbing 3-5 steps with a railing? : Total 6 Click Score: 15    End of Session Equipment Utilized During Treatment: Gait belt Activity Tolerance: Patient tolerated treatment well Patient left: in chair;with call bell/phone within reach;with chair alarm set Nurse Communication: Mobility status PT Visit Diagnosis: Difficulty in walking, not elsewhere classified (R26.2);Muscle weakness (generalized) (M62.81)    Time: 2979-8921 PT Time Calculation (min) (ACUTE ONLY): 25 min   Charges:   PT Evaluation $PT Eval Moderate Complexity: 1 Mod PT  Treatments $Therapeutic Activity: 8-22 mins       D. Royetta Asal PT, DPT 05/08/22, 4:47 PM

## 2022-05-09 ENCOUNTER — Ambulatory Visit: Payer: Medicare HMO | Admitting: Oncology

## 2022-05-09 ENCOUNTER — Ambulatory Visit: Payer: Medicare HMO

## 2022-05-09 DIAGNOSIS — F32A Depression, unspecified: Secondary | ICD-10-CM

## 2022-05-09 DIAGNOSIS — R1013 Epigastric pain: Secondary | ICD-10-CM

## 2022-05-09 DIAGNOSIS — I5032 Chronic diastolic (congestive) heart failure: Secondary | ICD-10-CM | POA: Diagnosis not present

## 2022-05-09 DIAGNOSIS — J449 Chronic obstructive pulmonary disease, unspecified: Secondary | ICD-10-CM | POA: Diagnosis not present

## 2022-05-09 DIAGNOSIS — R112 Nausea with vomiting, unspecified: Secondary | ICD-10-CM | POA: Diagnosis not present

## 2022-05-09 LAB — MAGNESIUM: Magnesium: 1.9 mg/dL (ref 1.7–2.4)

## 2022-05-09 LAB — CBC
HCT: 26.8 % — ABNORMAL LOW (ref 36.0–46.0)
Hemoglobin: 8.4 g/dL — ABNORMAL LOW (ref 12.0–15.0)
MCH: 24.6 pg — ABNORMAL LOW (ref 26.0–34.0)
MCHC: 31.3 g/dL (ref 30.0–36.0)
MCV: 78.4 fL — ABNORMAL LOW (ref 80.0–100.0)
Platelets: 238 10*3/uL (ref 150–400)
RBC: 3.42 MIL/uL — ABNORMAL LOW (ref 3.87–5.11)
RDW: 14.4 % (ref 11.5–15.5)
WBC: 5.6 10*3/uL (ref 4.0–10.5)
nRBC: 0 % (ref 0.0–0.2)

## 2022-05-09 LAB — BASIC METABOLIC PANEL
Anion gap: 7 (ref 5–15)
BUN: 11 mg/dL (ref 8–23)
CO2: 33 mmol/L — ABNORMAL HIGH (ref 22–32)
Calcium: 8.4 mg/dL — ABNORMAL LOW (ref 8.9–10.3)
Chloride: 96 mmol/L — ABNORMAL LOW (ref 98–111)
Creatinine, Ser: 0.78 mg/dL (ref 0.44–1.00)
GFR, Estimated: >60 mL/min (ref >60)
Glucose, Bld: 90 mg/dL (ref 70–99)
Potassium: 3.3 mmol/L — ABNORMAL LOW (ref 3.5–5.1)
Sodium: 136 mmol/L (ref 135–145)

## 2022-05-09 LAB — CELIAC DISEASE PANEL
Endomysial Ab, IgA: NEGATIVE
IgA: 138 mg/dL (ref 87–352)
Tissue Transglutaminase Ab, IgA: <2 U/mL (ref 0–3)

## 2022-05-09 MED ORDER — SODIUM CHLORIDE 0.9 % IV SOLN: INTRAVENOUS | Status: DC

## 2022-05-09 MED ORDER — POTASSIUM CHLORIDE CRYS ER 20 MEQ PO TBCR
40.0000 meq | EXTENDED_RELEASE_TABLET | Freq: Once | ORAL | Status: AC
  Administered 2022-05-09: 40 meq via ORAL
  Filled 2022-05-09: qty 2

## 2022-05-09 MED ORDER — POTASSIUM CHLORIDE CRYS ER 20 MEQ PO TBCR
40.0000 meq | EXTENDED_RELEASE_TABLET | Freq: Two times a day (BID) | ORAL | Status: DC
Start: 2022-05-09 — End: 2022-05-09

## 2022-05-09 NOTE — TOC Initial Note (Signed)
Transition of Care [facility]) - Initial/Assessment Note    Patient Details  Name: Morgan [name] MRN: [id number] Date of Birth: [birth year]  Transition of [facility] CM/SW Contact:    Laurena Slimmer, RN Phone Number: 05/09/2022, 12:16 PM  Clinical Narrative:                 Patient admitted from home 5/22 for nausea, vomiting and dry heaves for approximately one week. Discussed discharge plan regarding  PT/OT recommendation for SNF. Patient agreeable. Patient's choice is to return to [facility]. She was previously at [facility] 01/2022. Advised of bed offer and needed authorization approval. FL2 completed. Bed search initiated for [facility]  Expected Discharge Plan: [place] Barriers to Discharge: Continued Medical Work up   Patient Goals and CMS Choice Patient states their goals for this hospitalization and ongoing recovery are:: To return home      Expected Discharge Plan and Services Expected Discharge Plan: [place]       Living arrangements for the past 2 months: Single Family Home                                      Prior Living Arrangements/Services Living arrangements for the past 2 months: Single Family Home Lives with:: Self Patient language and need for interpreter reviewed:: Yes Do you feel safe going back to the place where you live?: Yes      Need for Family Participation in Patient Care: Yes (Comment) Care giver support system in place?: Yes (comment)   Criminal Activity/Legal Involvement Pertinent to Current Situation/Hospitalization: No - Comment as needed  Activities of Daily Living Home Assistive Devices/Equipment: Walker (specify type), Oxygen ADL Screening (condition at time of admission) Patient's cognitive ability adequate to safely complete daily activities?: Yes Is the patient deaf or have difficulty hearing?: No Does the patient have difficulty seeing, even when wearing glasses/contacts?: No Does the patient have  difficulty concentrating, remembering, or making decisions?: No Patient able to express need for assistance with ADLs?: Yes Does the patient have difficulty dressing or bathing?: No Independently performs ADLs?: Yes (appropriate for developmental age) Does the patient have difficulty walking or climbing stairs?: No Weakness of Legs: None Weakness of Arms/Hands: None  Permission Sought/Granted                  Emotional Assessment Appearance:: Other (Comment Required (Completed via phone) Attitude/Demeanor/Rapport: Gracious, Engaged Affect (typically observed): Accepting Orientation: : Oriented to Self, Oriented to Place, Oriented to  Time, Oriented to Situation Alcohol / Substance Use: Not Applicable Psych Involvement: No (comment)  Admission diagnosis:  Hypokalemia [E87.6] Lightheadedness [R42] Nausea [R11.0] Nausea & vomiting [R11.2] Abdominal pain, unspecified abdominal location [R10.9] Intractable nausea and vomiting [R11.2] Patient Active Problem List   Diagnosis Date Noted   Gastric erythema    Intractable nausea and vomiting 05/05/2022   Abdominal pain    Nausea    Nausea & vomiting 05/04/2022   Epigastric abdominal pain 05/04/2022   Parkinson disease ([place])    Near syncope    Normocytic anemia    Hemiplegic migraine, not intractable, 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   Vitamin B12 deficiency 02/20/2022   Vitamin D deficiency 02/20/2022   Parkinson's disease ([place]) 02/20/2022   Hemiplegic migraine 01/24/2022   Acute ischemic stroke ([place]) 01/18/2022   Edema, unspecified 01/10/2022   Vascular dementia, unspecified severity, with anxiety ([place]) 01/10/2022  Chronic back pain 01/10/2022   Stage 3b chronic kidney disease ([place]) 01/09/2022   Anemia 01/09/2022   Hypotension 10/27/2021   Tremor 10/27/2021   Acute on chronic diastolic CHF (congestive heart failure), NYHA class 3 ([place]) 10/25/2021   Microcytic anemia 09/11/2021   Stage 3a chronic kidney disease  ([place]) 09/11/2021   IDA (iron deficiency anemia) 09/11/2021   Elevated TSH 08/28/2021   Osteopenia 08/28/2021   DOE (dyspnea on exertion) 08/28/2021   Acute midline thoracic back pain 06/26/2021   Other chronic pain 06/26/2021   Chest pressure 02/20/2021   Left sided abdominal pain 02/01/2021   Chronic diastolic CHF (congestive heart failure) ([place]) 08/09/2020   COPD (chronic obstructive pulmonary disease) ([place]) 08/09/2020   Chronic respiratory failure with hypoxia ([place]) 08/09/2020   Polyp of sigmoid colon    Polyp of cecum    Gastritis and gastroduodenitis    Pylorus ulcer    Pyloric stenosis in adult    Chronic cholecystitis 05/19/2020   Oxygen dependent 03/30/2020   Nausea and vomiting in adult 03/22/2020   Peptic ulcer disease 03/22/2020   Iron deficiency anemia 03/22/2020   Pain in right knee 12/30/2019   Pain of lumbar spine 12/30/2019   Acute pain of right knee 12/29/2019   Acute left-sided low back pain without sciatica 12/29/2019   Chronic abdominal pain 02/19/2019   Asymptomatic bacteriuria 02/19/2019   Viral gastroenteritis 02/19/2019   CKD (chronic kidney disease) stage 3, GFR 30-59 ml/min ([place]) 01/13/2019   CHF (congestive heart failure) ([place]) 01/13/2019   Dizziness 01/13/2019   Heart failure, unspecified ([place]) 01/13/2019   Hoarseness 11/12/2018   Dysphagia 11/12/2018   Sleepiness 11/12/2018   Oropharyngeal dysphagia 11/12/2018   Hoarse 11/12/2018   Drowsy 11/12/2018   CVA (cerebral vascular accident) ([place]) 08/22/2018   Cerebrovascular accident ([place]) 08/22/2018   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[place]) 05/08/2018   Non-rheumatic mitral regurgitation 11/21/2017   Left-sided weakness 10/04/2017   HLD (hyperlipidemia) 10/04/2017   GERD (gastroesophageal reflux disease) 10/04/2017   Hyperlipidemia, unspecified 10/04/2017   Left hemiparesis ([place]) 10/04/2017   Gastroesophageal reflux disease 10/04/2017   Anxiety 08/26/2017   Essential hypertension    Depression     Diarrhea 02/22/2015   Hypokalemia 02/22/2015   Chronic lower back pain 04/29/2013   Paresthesia 04/29/2013   Chronic low back pain 04/29/2013   PCP:  [name], MD Pharmacy:   [id number]Phone: [phone]: [address]Phone: [phone]: [phone]    Social Determinants of Health (SDOH) Interventions    Readmission Risk Interventions     View : No data to display.

## 2022-05-09 NOTE — Progress Notes (Signed)
PROGRESS NOTE    Morgan [name]  OQH:[id number] DOB: [birth year] DOA: 05/04/2022 PCP: [name], MD    Brief Narrative:  HPI was taken from Dr. Blaine Hamper: Morgan [name] is a 71 y.o. female with medical history significant of, PUD, gastritis, duodenitis, hemiplegic migraine with mild left-sided weakness, hypertension, hyperlipidemia, COPD on 3 L oxygen, GERD, depression, anxiety, Parkinson's disease, CHF, anemia, CKD-3A who presents with epigastric abdominal pain, intractable nausea and dry heaves, near syncope.   Patient states that she has severe nausea, dry heaves and epigastric abdominal pain for more than a week.  She states that her nausea and dry heaves are constant and intractable.  Her epigastric abdominal pain is sharp, 8 out of 10 in severity, constant, nonradiating.  No diarrhea.  No fever or chills.  She states that she has lightheadedness and dizziness, feels like she is going to pass out, but did not.  No fall.  No injury.  Denies symptoms of UTI.   Data Reviewed and ED Course: pt was found to have WBC 6.6, lipase 21, liver function normal, troponin 5, potassium 2.8, stable renal function, temperature normal, blood pressure 162/96, heart rate 91, RR 20, oxygen saturation 98% on 3-4 L oxygen, CT of abdomen/pelvis showed sigmoid diverticulosis, otherwise negative for acute findings.  Patient is placed on telemetry bed for observation.  Dr. Allen Norris of GI is consulted.     As per Dr. Jimmye Norman 5/20-5/23/23:  Pt presented w/ nausea and vomiting/dry heaving of unknown etiology. CT abd/pelvis was neg for any acute findings. Pt is s/p EGD which was normal & biospies were taken. Celiac panel ordered as per GI. Pt is still c/o nausea & dry heaving. Pt does have hx of pyloric stenosis   5/24 vomiting liquid today. Some abd discomfort.   Consultants:  GI  Procedures:   Antimicrobials:      Subjective: No sob or cp  Objective: Vitals:   05/09/22 0120 05/09/22 0331 05/09/22 0738 05/09/22  1158  BP: 109/66 121/60 111/63 122/60  Pulse: 67 66 69 65  Resp: '20 20 18 18  '$ Temp: 98.5 F (36.9 C) 98 F (36.7 C) 98.1 F (36.7 C) 98.3 F (36.8 C)  TempSrc:   Oral   SpO2: 98% 96% 95% 96%  Weight:      Height:        Intake/Output Summary (Last 24 hours) at 05/09/2022 1518 Last data filed at 05/09/2022 1500 Gross per 24 hour  Intake 480 ml  Output 1760 ml  Net -1280 ml   Filed Weights   05/04/22 0915  Weight: 90.7 kg    Examination:  Calm, NAD Cta no w/r Reg s1/s2 no gallop Soft benign +bs No edema Aaoxox3  Mood and affect appropriate in current setting    Data Reviewed: I have personally reviewed following labs and imaging studies  CBC: Recent Labs  Lab 05/04/22 0922 05/06/22 0421 05/07/22 0333 05/08/22 0426 05/09/22 0606  WBC 6.6 5.2 5.5 5.8 5.6  HGB 10.6* 8.4* 8.1* 8.7* 8.4*  HCT 34.0* 27.2* 26.2* 27.8* 26.8*  MCV 80.2 80.0 79.2* 79.4* 78.4*  PLT 407* 301 267 251 546   Basic Metabolic Panel: Recent Labs  Lab 05/04/22 0922 05/05/22 0448 05/06/22 0421 05/07/22 0333 05/08/22 0426 05/09/22 0606  NA 136 137 138 139 136 136  K 2.8* 3.4* 3.1* 3.3* 3.4* 3.3*  CL 94* 98 101 103 98 96*  CO2 33* 33* 31 29 32 33*  GLUCOSE 126* 113* 85 81 98 90  BUN '23 21 18 14 11 11  '$ CREATININE 1.05* 0.91 0.92 0.74 0.76 0.78  CALCIUM 8.7* 8.5* 7.9* 8.3* 8.3* 8.4*  MG 1.9  --   --  1.9 1.9 1.9   GFR: Estimated Creatinine Clearance: 86.6 mL/min (by C-G formula based on SCr of 0.78 mg/dL). Liver Function Tests: Recent Labs  Lab 05/04/22 1345  AST 18  ALT 9  ALKPHOS 120  BILITOT 0.9  PROT 6.8  ALBUMIN 3.1*   Recent Labs  Lab 05/04/22 1345  LIPASE 21   No results for input(s): AMMONIA in the last 168 hours. Coagulation Profile: No results for input(s): INR, PROTIME in the last 168 hours. Cardiac Enzymes: No results for input(s): CKTOTAL, CKMB, CKMBINDEX, TROPONINI in the last 168 hours. BNP (last 3 results) Recent Labs    08/29/21 0931  PROBNP  180.0*   HbA1C: No results for input(s): HGBA1C in the last 72 hours. CBG: No results for input(s): GLUCAP in the last 168 hours. Lipid Profile: No results for input(s): CHOL, HDL, LDLCALC, TRIG, CHOLHDL, LDLDIRECT in the last 72 hours. Thyroid Function Tests: No results for input(s): TSH, T4TOTAL, FREET4, T3FREE, THYROIDAB in the last 72 hours. Anemia Panel: No results for input(s): VITAMINB12, FOLATE, FERRITIN, TIBC, IRON, RETICCTPCT in the last 72 hours. Sepsis Labs: Recent Labs  Lab 05/04/22 1345  LATICACIDVEN 1.4    No results found for this or any previous visit (from the past 240 hour(s)).       Radiology Studies: No results found.      Scheduled Meds:  atorvastatin  40 mg Oral Daily   carbidopa-levodopa  2 tablet Oral TID   enoxaparin (LOVENOX) injection  40 mg Subcutaneous Q24H   feeding supplement  1 Container Oral TID BM   fluticasone furoate-vilanterol  1 puff Inhalation Daily   metoCLOPramide  5 mg Oral TID AC & HS   multivitamin with minerals  1 tablet Oral Daily   pantoprazole  40 mg Oral BID AC   scopolamine  1 patch Transdermal Q72H   umeclidinium bromide  1 puff Inhalation Daily   Continuous Infusions:  Assessment & Plan:   Principal Problem:   Nausea & vomiting Active Problems:   Epigastric abdominal pain   Hypokalemia   Essential hypertension   Depression   HLD (hyperlipidemia)   GERD (gastroesophageal reflux disease)   Chronic diastolic CHF (congestive heart failure) (H[facility])   COPD (chronic obstructive pulmonary disease) (H[facility])   Stage 3a chronic kidney disease ([facility])   Hemiplegic migraine   Parkinson disease ([facility])   Near syncope   Normocytic anemia   Abdominal pain   Nausea   Intractable nausea and vomiting   Gastric erythema   Nausea & vomiting: etiology unclear, CT abd/pelvis is neg for any acute findings. Hx of pyloric stenosis.Still w/ nausea & dry heaves. Continue on scopolamine patch & reglan. 5/24 s/p egd on 5/22  revealing mild gastric erythema only. Celiac panel pending. Gastric bx no evidence of H.pylori   Normocytic anemia: H/h stable   Near syncope: Likely secondary to dehydration and orthostatics    Parkinson disease: Continue Sinemet    Hemiplegic migraine: excedrin prn. Depakote was d/c by pt's physician    CKDIIIa: Cr is better than baseline    COPD: w/o exacerbation. Continue on supplemental oxygen, uses 3L [place] at home    Chronic diastolic CHF: echo on 04/23/3219 showed EF of 60 to 65% with grade 1 diastolic dysfunction.CHF seems compensated. Hold lasix    GERD: continue on  PPI    HLD: continue on statin    Depression: severity unknown. Pt stopped taking fluoxetine    HTN:  BP is WNL.     Hypokalemia: Replace   Thrombocytosis: resolved    Chronic respiratory failure: continue on supplemental oxygen, uses 3L [place]        DVT prophylaxis: Lovenox Code Status: Full Family Communication: None at bedside Disposition Plan:  Status is: Inpatient Remains inpatient appropriate because: IV treatment        LOS: 4 days   Time spent: 35 minutes    Nolberto Hanlon, MD [facility] Pager [phone]  If 7PM-7AM, please contact night-coverage 05/09/2022, 3:18 PM

## 2022-05-09 NOTE — Care Management Important Message (Signed)
Important Message  Patient Details  Name: Morgan [name] MRN: [id number] Date of Birth: 1951/08/02   Medicare Important Message Given:  Yes     Dannette Barbara 05/09/2022, 11:14 AM

## 2022-05-09 NOTE — NC FL2 (Signed)
[place] LEVEL OF CARE SCREENING TOOL     IDENTIFICATION  Patient Name: Morgan [name] Birthdate: 1951/03/25 Sex: female Admission Date (Current Location): 05/04/2022  [facility] and [place] Number:  Engineering geologist and Address:  [facility], [address], [place], [place] [address]      Provider Number: [id number]  Attending Physician Name and Address:  Nolberto Hanlon, MD  Relative Name and Phone Number:  Tyrell Antonio [phone]    Current Level of Care: Hospital Recommended Level of Care: [place] Prior Approval Number:    Date Approved/Denied:   PASRR Number: [id number]  Discharge Plan: SNF    Current Diagnoses: Patient Active Problem List   Diagnosis Date Noted   Gastric erythema    Intractable nausea and vomiting 05/05/2022   Abdominal pain    Nausea    Nausea & vomiting 05/04/2022   Epigastric abdominal pain 05/04/2022   Parkinson disease ([place])    Near syncope    Normocytic anemia    Hemiplegic migraine, not intractable, 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   Vitamin B12 deficiency 02/20/2022   Vitamin D deficiency 02/20/2022   Parkinson's disease ([place]) 02/20/2022   Hemiplegic migraine 01/24/2022   Acute ischemic stroke ([place]) 01/18/2022   Edema, unspecified 01/10/2022   Vascular dementia, unspecified severity, with anxiety ([place]) 01/10/2022   Chronic back pain 01/10/2022   Stage 3b chronic kidney disease ([place]) 01/09/2022   Anemia 01/09/2022   Hypotension 10/27/2021   Tremor 10/27/2021   Acute on chronic diastolic CHF (congestive heart failure), NYHA class 3 ([place]) 10/25/2021   Microcytic anemia 09/11/2021   Stage 3a chronic kidney disease ([place]) 09/11/2021   IDA (iron deficiency anemia) 09/11/2021   Elevated TSH 08/28/2021   Osteopenia 08/28/2021   DOE (dyspnea on exertion) 08/28/2021   Acute midline thoracic back pain 06/26/2021   Other chronic pain 06/26/2021   Chest pressure 02/20/2021    Left sided abdominal pain 02/01/2021   Chronic diastolic CHF (congestive heart failure) ([place]) 08/09/2020   COPD (chronic obstructive pulmonary disease) ([place]) 08/09/2020   Chronic respiratory failure with hypoxia ([place]) 08/09/2020   Polyp of sigmoid colon    Polyp of cecum    Gastritis and gastroduodenitis    Pylorus ulcer    Pyloric stenosis in adult    Chronic cholecystitis 05/19/2020   Oxygen dependent 03/30/2020   Nausea and vomiting in adult 03/22/2020   Peptic ulcer disease 03/22/2020   Iron deficiency anemia 03/22/2020   Pain in right knee 12/30/2019   Pain of lumbar spine 12/30/2019   Acute pain of right knee 12/29/2019   Acute left-sided low back pain without sciatica 12/29/2019   Chronic abdominal pain 02/19/2019   Asymptomatic bacteriuria 02/19/2019   Viral gastroenteritis 02/19/2019   CKD (chronic kidney disease) stage 3, GFR 30-59 ml/min ([place]) 01/13/2019   CHF (congestive heart failure) ([place]) 01/13/2019   Dizziness 01/13/2019   Heart failure, unspecified (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[place]) 05/08/2018   Non-rheumatic mitral regurgitation 11/21/2017   Left-sided weakness 10/04/2017   HLD (hyperlipidemia) 10/04/2017   GERD (gastroesophageal reflux disease) 10/04/2017   Hyperlipidemia, unspecified 10/04/2017   Left hemiparesis ([place]) 10/04/2017   Gastroesophageal reflux disease 10/04/2017   Anxiety 08/26/2017   Essential hypertension  Depression    Diarrhea 02/22/2015   Hypokalemia 02/22/2015   Chronic lower back pain 04/29/2013   Paresthesia 04/29/2013   Chronic low back pain 04/29/2013    Orientation RESPIRATION BLADDER Height & Weight     Self, Time, Situation, Place  O2 External catheter Weight:  90.7 kg Height:  '6\' 1"'$  (185.4 cm)  BEHAVIORAL SYMPTOMS/MOOD NEUROLOGICAL BOWEL NUTRITION STATUS   (n/a)  (n/a) Continent Diet (Soft)  AMBULATORY STATUS COMMUNICATION OF NEEDS Skin   Limited Assist Verbally Normal                       Personal Care Assistance Level of Assistance  Bathing, Dressing Bathing Assistance: Limited assistance   Dressing Assistance: Limited assistance     Functional Limitations Info             SPECIAL CARE FACTORS FREQUENCY  PT (By licensed PT), OT (By licensed OT)     PT Frequency: Min 2x weekly OT Frequency: Min 2x weekly            Contractures Contractures Info: Not present    Additional Factors Info  Code Status, Allergies Code Status Info: Full Allergies Info: Meperidine, Strawberry Extract, Sucralfate, Wasp Venom           Current Medications (05/09/2022):  This is the current hospital active medication list Current Facility-Administered Medications  Medication Dose Route Frequency Provider Last Rate Last Admin   acetaminophen (TYLENOL) tablet 650 mg  650 mg Oral Q6H PRN Ivor Costa, MD   650 mg at 05/04/22 2227   albuterol (VENTOLIN HFA) 108 (90 Base) MCG/ACT inhaler 2 puff  2 puff Inhalation Q4H PRN Ivor Costa, MD       aspirin-acetaminophen-caffeine (EXCEDRIN MIGRAINE) per tablet 1-2 tablet  1-2 tablet Oral Q6H PRN Ivor Costa, MD       atorvastatin (LIPITOR) tablet 40 mg  40 mg Oral Daily Ivor Costa, MD   40 mg at 05/09/22 6333   bisacodyl (DULCOLAX) suppository 10 mg  10 mg Rectal Daily PRN Wyvonnia Dusky, MD   10 mg at 05/05/22 1001   carbidopa-levodopa (SINEMET IR) 25-100 MG per tablet immediate release 2 tablet  2 tablet Oral TID Ivor Costa, MD   2 tablet at 05/09/22 0929   diphenhydrAMINE (BENADRYL) injection 12.5 mg  12.5 mg Intravenous Q8H PRN Ivor Costa, MD   12.5 mg at 05/09/22 1129   docusate sodium (COLACE) capsule 200 mg  200 mg Oral BID PRN Wyvonnia Dusky, MD   200 mg at 05/05/22 1001   enoxaparin  (LOVENOX) injection 40 mg  40 mg Subcutaneous Q24H Ivor Costa, MD   40 mg at 05/08/22 2108   feeding supplement (BOOST / RESOURCE BREEZE) liquid 1 Container  1 Container Oral TID BM Wyvonnia Dusky, MD   1 Container at 05/08/22 1014   fluticasone furoate-vilanterol (BREO ELLIPTA) 200-25 MCG/ACT 1 puff  1 puff Inhalation Daily Lorna Dibble, RPH   1 puff at 05/09/22 0932   hydrALAZINE (APRESOLINE) injection 5 mg  5 mg Intravenous Q2H PRN Ivor Costa, MD       hydrOXYzine (ATARAX) tablet 25 mg  25 mg Oral Q8H PRN Ivor Costa, MD       metoCLOPramide (REGLAN) tablet 5 mg  5 mg Oral TID AC & HS Vanga, Tally Due, MD   5 mg at 05/09/22 1136   morphine (PF) 2 MG/ML injection 2 mg  2 mg Intravenous Q3H PRN Ivor Costa, MD  2 mg at 05/09/22 1130   multivitamin with minerals tablet 1 tablet  1 tablet Oral Daily Wyvonnia Dusky, MD   1 tablet at 05/09/22 0929   oxyCODONE-acetaminophen (PERCOCET/ROXICET) 5-325 MG per tablet 1 tablet  1 tablet Oral Q6H PRN Wyvonnia Dusky, MD   1 tablet at 05/09/22 0929   pantoprazole (PROTONIX) EC tablet 40 mg  40 mg Oral BID AC Lin Landsman, MD   40 mg at 05/09/22 0751   scopolamine (TRANSDERM-SCOP) 1 MG/3DAYS 1.5 mg  1 patch Transdermal Q72H Wyvonnia Dusky, MD   1.5 mg at 05/08/22 1010   umeclidinium bromide (INCRUSE ELLIPTA) 62.5 MCG/ACT 1 puff  1 puff Inhalation Daily Lorna Dibble, RPH   1 puff at 05/09/22 0940     Discharge Medications: Please see discharge summary for a list of discharge medications.  Relevant Imaging Results:  Relevant Lab Results:   Additional Information Patient's SS# [id number]  Laurena Slimmer, RN

## 2022-05-09 NOTE — Consult Note (Signed)
[facility] Inpatient Consult   05/09/2022  Morgan [name] Dec 27, 1950 [id number]  [facility] CM)   Patient chart has been reviewed with noted high risk score for unplanned readmissions.  Patient assessed for community [facility] Management follow up needs. Per review, current recommendation is for SNF. If patient transitions to SNF, no [facility] CM needs.   Of note, [facility] Care Management services does not replace or interfere with any services that are arranged by inpatient case management or social work.    Netta Cedars, MSN, RN [place] Liaison Toll free office [phone]

## 2022-05-10 DIAGNOSIS — I5032 Chronic diastolic (congestive) heart failure: Secondary | ICD-10-CM | POA: Diagnosis not present

## 2022-05-10 DIAGNOSIS — R1013 Epigastric pain: Secondary | ICD-10-CM | POA: Diagnosis not present

## 2022-05-10 DIAGNOSIS — R112 Nausea with vomiting, unspecified: Secondary | ICD-10-CM | POA: Diagnosis not present

## 2022-05-10 DIAGNOSIS — J449 Chronic obstructive pulmonary disease, unspecified: Secondary | ICD-10-CM | POA: Diagnosis not present

## 2022-05-10 LAB — POTASSIUM: Potassium: 3.3 mmol/L — ABNORMAL LOW (ref 3.5–5.1)

## 2022-05-10 MED ORDER — PANTOPRAZOLE SODIUM 40 MG PO TBEC
40.0000 mg | DELAYED_RELEASE_TABLET | Freq: Two times a day (BID) | ORAL | 0 refills | Status: DC
Start: 2022-05-10 — End: 2022-08-09

## 2022-05-10 MED ORDER — DIPHENHYDRAMINE HCL 50 MG/ML IJ SOLN: 12.5000 mg | Freq: Three times a day (TID) | INTRAMUSCULAR | 0 refills | Status: DC | PRN

## 2022-05-10 MED ORDER — POTASSIUM CHLORIDE CRYS ER 20 MEQ PO TBCR
40.0000 meq | EXTENDED_RELEASE_TABLET | Freq: Once | ORAL | Status: AC
  Administered 2022-05-10: 40 meq via ORAL
  Filled 2022-05-10: qty 2

## 2022-05-10 MED ORDER — OXYCODONE HCL 5 MG PO TABS: 5.0000 mg | ORAL_TABLET | Freq: Four times a day (QID) | ORAL | 0 refills | Status: AC | PRN

## 2022-05-10 MED ORDER — METOCLOPRAMIDE HCL 10 MG PO TABS: 10.0000 mg | ORAL_TABLET | Freq: Three times a day (TID) | ORAL | Status: DC

## 2022-05-10 MED ORDER — SUCRALFATE 1 GM/10ML PO SUSP
1.0000 g | Freq: Two times a day (BID) | ORAL | Status: DC
  Administered 2022-05-10: 1 g via ORAL
  Filled 2022-05-10: qty 10

## 2022-05-10 MED ORDER — SUCRALFATE 1 GM/10ML PO SUSP: 1.0000 g | Freq: Two times a day (BID) | ORAL | 0 refills | Status: DC

## 2022-05-10 MED ORDER — METOCLOPRAMIDE HCL 10 MG PO TABS
5.0000 mg | ORAL_TABLET | Freq: Three times a day (TID) | ORAL | Status: DC
Start: 2022-05-10 — End: 2022-05-10

## 2022-05-10 MED ORDER — SCOPOLAMINE 1 MG/3DAYS TD PT72: 1.0000 | MEDICATED_PATCH | TRANSDERMAL | 0 refills | Status: DC

## 2022-05-10 MED ORDER — METOCLOPRAMIDE HCL 5 MG PO TABS: 5.0000 mg | ORAL_TABLET | Freq: Three times a day (TID) | ORAL | 0 refills | Status: DC

## 2022-05-10 NOTE — Discharge Summary (Signed)
ESSANCE GATTI IRC:[id number] DOB: [birth year] DOA: 05/04/2022  PCP: Morgan Noe, MD  Admit date: 05/04/2022 Discharge date: 05/10/2022  Admitted From: home Disposition:  home, refused SNF  Recommendations for Outpatient Follow-up:  Follow up with PCP in 1 week Please obtain BMP/CBC in one week Please follow up with GI, Dr. Marius Ditch in one week  Home Health:yes    Discharge Condition:Stable CODE STATUS:full Diet recommendation: Heart Healthy  Brief/Interim Summary: Per PZW:[name] is a 71 y.o. female with medical history significant of, PUD, gastritis, duodenitis, hemiplegic migraine with mild left-sided weakness, hypertension, hyperlipidemia, COPD on 3 L oxygen, GERD, depression, anxiety, Parkinson's disease, CHF, anemia, CKD-3A who presents with epigastric abdominal pain, intractable nausea and dry heaves, near syncope.ED Course: pt was found to have WBC 6.6, lipase 21, liver function normal, troponin 5, potassium 2.8, stable renal function, temperature normal, blood pressure 162/96, heart rate 91, RR 20, oxygen saturation 98% on 3-4 L oxygen, CT of abdomen/pelvis showed sigmoid diverticulosis, otherwise negative for acute findings.  Patient is placed on telemetry bed for observation.  Dr. Allen Norris of GI is consulted. Patient was admitted to the hospital.  CT of abdomen and pelvis without any acute abnormality.  He underwent EGD which was unremarkable, gastric biopsy negative for H. pylori.  Celiac disease panel in process.  She was started on Reglan and PPI twice daily.  Her electrolytes were repleted.  Once she was stable from GI standpoint she was discharged with close follow-up with GI as outpatient.  Nausea & vomiting:  etiology unclear, CT abd/pelvis is neg for any acute findings. Gi was consulted S/p EGD on 5/22, unremarkable, gastric biopsies negative for H. pylori Celiac disease panel in process Continue Reglan 5 mg before each meal and at bedtime and as outpatient with close  follow-up with GI upon discharge Recommend small frequent meals, low-carb, low-fat diet Protonix 40 mg p.o. twice daily Added sucralfate (has nausea under allergy.  Afraid however when I asked the patient that she does not remember and is willing to try it.)     Normocytic anemia: H/h stable     Near syncope: Likely secondary to dehydration and orthostatics      Parkinson disease: Continue Sinemet     Hemiplegic migraine: excedrin prn. Depakote was d/c by pt's physician    CKDIIIa: Cr is better than baseline    COPD: w/o exacerbation. Continue on supplemental oxygen, uses 3L [place] at home    Chronic diastolic CHF: echo on 06/23/2422 showed EF of 60 to 65% with grade 1 diastolic dysfunction.CHF seems compensated.     GERD: continue on PPI bid   HLD: continue on statin    Depression: severity unknown. Pt stopped taking fluoxetine    HTN:  BP is WNL.     Hypokalemia: Replaced    Thrombocytosis: resolved    Chronic respiratory failure: continue on supplemental oxygen, uses 3L [place]          Discharge Diagnoses:  Principal Problem:   Nausea & vomiting Active Problems:   Epigastric abdominal pain   Hypokalemia   Essential hypertension   Depression   HLD (hyperlipidemia)   GERD (gastroesophageal reflux disease)   Chronic diastolic CHF (congestive heart failure) (HCC)   COPD (chronic obstructive pulmonary disease) (HCC)   Stage 3a chronic kidney disease (HCC)   Hemiplegic migraine   Parkinson disease (HCC)   Near syncope   Normocytic anemia   Abdominal pain   Nausea   Intractable nausea and vomiting  Gastric erythema    Discharge Instructions  Discharge Instructions     Call MD for:  persistant nausea and vomiting   Complete by: As directed    Diet - low sodium heart healthy   Complete by: As directed    Discharge instructions   Complete by: As directed    Recommend small frequent meals, low-carb, low-fat diet F/u with GI dr. Marius Ditch next week.    Increase activity slowly   Complete by: As directed       Allergies as of 05/10/2022       Reactions   Meperidine Hives, Anaphylaxis   Strawberry Extract Hives, Swelling   Sucralfate Nausea Only, Other (See Comments)   Severe nausea Other reaction(s): Other (See Comments) Severe nausea Severe nausea   Wasp Venom Hives        Medication List     STOP taking these medications    carvedilol 12.5 MG tablet Commonly known as: COREG   divalproex 500 MG DR tablet Commonly known as: DEPAKOTE   FLUoxetine 40 MG capsule Commonly known as: PROZAC   furosemide 40 MG tablet Commonly known as: LASIX   gabapentin 400 MG capsule Commonly known as: NEURONTIN   hydrOXYzine 25 MG tablet Commonly known as: ATARAX   mirtazapine 15 MG tablet Commonly known as: REMERON   omeprazole 40 MG capsule Commonly known as: PRILOSEC   ondansetron 4 MG disintegrating tablet Commonly known as: ZOFRAN-ODT   valsartan 320 MG tablet Commonly known as: DIOVAN       TAKE these medications    acetaminophen 325 MG tablet Commonly known as: TYLENOL Take 2 tablets (650 mg total) by mouth every 4 (four) hours as needed for mild pain (or temp > 37.5 C (99.5 F)).   Aimovig 140 MG/ML Soaj Generic drug: Erenumab-aooe SMARTSIG:140 Milligram(s) SUB-Q Once a Month   aspirin-acetaminophen-caffeine 250-250-65 MG tablet Commonly known as: EXCEDRIN MIGRAINE Take 1-2 tablets by mouth every 6 (six) hours as needed for headache.   atorvastatin 40 MG tablet Commonly known as: LIPITOR TAKE 1 TABLET BY MOUTH EVERY DAY   Breztri Aerosphere 160-9-4.8 MCG/ACT Aero Generic drug: Budeson-Glycopyrrol-Formoterol Inhale 2 puffs into the lungs in the morning and at bedtime.   carbidopa-levodopa 25-100 MG tablet Commonly known as: SINEMET IR Take 2 tablets by mouth 3 (three) times daily.   Cholecalciferol 1.25 MG (50000 UT) Tabs Take 1 tablet by mouth once a week.   diphenhydrAMINE 50 MG/ML  injection Commonly known as: BENADRYL Inject 0.25 mLs (12.5 mg total) into the vein every 8 (eight) hours as needed for up to 5 days (nausea vomiting).   fluticasone 50 MCG/ACT nasal spray Commonly known as: FLONASE Place 1 spray into both nostrils daily.   metoCLOPramide 5 MG tablet Commonly known as: REGLAN Take 1 tablet (5 mg total) by mouth 4 (four) times daily -  before meals and at bedtime for 10 days.   montelukast 10 MG tablet Commonly known as: SINGULAIR Take 1 tablet (10 mg total) by mouth at bedtime.   oxyCODONE 5 MG immediate release tablet Commonly known as: Roxicodone Take 1 tablet (5 mg total) by mouth every 6 (six) hours as needed for up to 2 days.   OXYGEN Inhale 3 L/min into the lungs See admin instructions. 3 L/min at bedtime and as needed for shortness of breath throughout the day   pantoprazole 40 MG tablet Commonly known as: PROTONIX Take 1 tablet (40 mg total) by mouth 2 (two) times daily before a meal.  scopolamine 1 MG/3DAYS Commonly known as: TRANSDERM-SCOP Place 1 patch (1.5 mg total) onto the skin every 3 (three) days. Start taking on: May 11, 2022   sucralfate 1 GM/10ML suspension Commonly known as: CARAFATE Take 10 mLs (1 g total) by mouth 2 (two) times daily.               Durable Medical Equipment  (From admission, onward)           Start     Ordered   05/10/22 1038  For home use only DME Walker rolling  Once       Question Answer Comment  Walker: With [place] Wheels   Patient needs a walker to treat with the following condition Weakness      05/10/22 1037   05/10/22 1038  For home use only DME 3 n 1  Once        05/10/22 1037   05/10/22 0936  For home use only DME Cane  Once        05/10/22 0935            Follow-up Information     Morgan Noe, MD. Schedule an appointment as soon as possible for a visit in 1 week(s).   Specialty: Family Medicine Why: appt for 05/15/2022 at 12pm Contact information: [place] [address] [phone]         Lin Landsman, MD Follow up in 1 week(s).   Specialty: Gastroenterology Contact information: [place] [address] [phone]         [facility] Follow up.   Why: They will follow up with you for your home health needs. Call Sharmon Revere with any questions/concerns: [phone]. Start of care likely within 48 hours. Contact information: [place] [address] [phone]                Allergies  Allergen Reactions   Meperidine Hives and Anaphylaxis   Strawberry Extract Hives and Swelling   Sucralfate Nausea Only and Other (See Comments)    Severe nausea Other reaction(s): Other (See Comments) Severe nausea Severe nausea   Wasp Venom Hives    Consultations: GI   Procedures/Studies: DG Chest 2 View  Result Date: 04/13/2022 CLINICAL DATA:  Cough and fever. EXAM: CHEST - 2 VIEW COMPARISON:  Chest two views 03/14/2022 and 10/05/2021; CT chest 02/20/2021 FINDINGS: Cardiac silhouette and mediastinal contours are within normal limits. There is flattening of the diaphragms and moderate hyperinflation, unchanged. Chronic bilateral interstitial thickening is unchanged from multiple prior radiographs and CT seen both within the upper lungs and lower lungs. No new focal airspace opacity to indicate pneumonia. No pulmonary edema pleural effusion or pneumothorax. Cement augmentation of the T9 and T10 vertebral bodies is again seen. IMPRESSION: No active cardiopulmonary disease. Chronic hyperinflation and chronic interstitial thickening, unchanged. Electronically Signed   By: Yvonne Kendall M.D.   On: 04/13/2022 10:15   CT ABDOMEN PELVIS W CONTRAST  Result Date: 05/04/2022 CLINICAL DATA:  Abdominal pain, vomiting. EXAM: CT ABDOMEN AND PELVIS WITH CONTRAST TECHNIQUE: Multidetector CT imaging of the abdomen and pelvis was performed using the standard protocol  following bolus administration of intravenous contrast. RADIATION DOSE REDUCTION: This exam was performed according to the departmental dose-optimization program which includes automated exposure control, adjustment of the mA and/or kV according to patient size and/or use of iterative reconstruction technique. CONTRAST:  173m OMNIPAQUE IOHEXOL 300 MG/ML  SOLN COMPARISON:  January 18, 2022. FINDINGS: Lower chest: No acute abnormality. Hepatobiliary: No focal liver abnormality is seen. Status post cholecystectomy. Mild intrahepatic and extrahepatic biliary dilatation is noted most likely due to post cholecystectomy status. Pancreas: Unremarkable. No pancreatic ductal dilatation or surrounding inflammatory changes. Spleen: Normal in size without focal abnormality. Adrenals/Urinary Tract: Adrenal glands are unremarkable. Kidneys are normal, without renal calculi, focal lesion, or hydronephrosis. Bladder is unremarkable. Stomach/Bowel: The stomach appears normal. There is no evidence of bowel obstruction or inflammation. Status post appendectomy. Sigmoid diverticulosis without inflammation. Vascular/Lymphatic: Aortic atherosclerosis. No enlarged abdominal or pelvic lymph nodes. Reproductive: Status post hysterectomy. No adnexal masses. Other: No abdominal wall hernia or abnormality. No abdominopelvic ascites. Musculoskeletal: Status post T8-T9 kyphoplasty. No acute abnormality seen. IMPRESSION: Sigmoid diverticulosis without inflammation. No acute abnormality seen in the abdomen or pelvis. Aortic Atherosclerosis (ICD10-I70.0). Electronically Signed   By: Marijo Conception M.D.   On: 05/04/2022 14:31   DG Abd Portable 1 View  Result Date: 05/04/2022 CLINICAL DATA:  Possible small bowel obstruction. Abdominal pain and dry heaving beginning today. EXAM: PORTABLE ABDOMEN - 1 VIEW COMPARISON:  08/10/2020 FINDINGS: Bowel gas pattern is nonobstructive. Air and mild stool present throughout the colon. No dilated small bowel  loops. No free peritoneal air. Previous kyphoplasties of T9 and T10. Several pelvic phleboliths. IMPRESSION: Nonobstructive bowel gas pattern. Electronically Signed   By: Marin Olp M.D.   On: 05/04/2022 13:44      Subjective:   Discharge Exam: Vitals:   05/10/22 0735 05/10/22 1144  BP: (!) 119/54 112/68  Pulse: 62 71  Resp: 18 18  Temp: 98.6 F (37 C) 98.3 F (36.8 C)  SpO2: 97% 100%   Vitals:   05/09/22 2318 05/10/22 0330 05/10/22 0735 05/10/22 1144  BP: 109/64 125/66 (!) 119/54 112/68  Pulse: 64 60 62 71  Resp: '18 20 18 18  '$ Temp: 97.8 F (36.6 C) 98 F (36.7 C) 98.6 F (37 C) 98.3 F (36.8 C)  TempSrc:  Axillary    SpO2: 97% 100% 97% 100%  Weight:      Height:        General: Pt is alert, awake, not in acute distress Cardiovascular: RRR, S1/S2 +, no rubs, no gallops Respiratory: CTA bilaterally, no wheezing, no rhonchi Abdominal: Soft, NT, ND, bowel sounds + Extremities: no edema, no cyanosis    The results of significant diagnostics from this hospitalization (including imaging, microbiology, ancillary and laboratory) are listed below for reference.     Microbiology: No results found for this or any previous visit (from the past 240 hour(s)).   Labs: BNP (last 3 results) Recent Labs    05/04/22 0922  BNP 70.1   Basic Metabolic Panel: Recent Labs  Lab 05/04/22 0922 05/05/22 0448 05/06/22 0421 05/07/22 0333 05/08/22 0426 05/09/22 0606 05/10/22 0552  NA 136 137 138 139 136 136  --   K 2.8* 3.4* 3.1* 3.3* 3.4* 3.3* 3.3*  CL 94* 98 101 103 98 96*  --   CO2 33* 33* 31 29 32 33*  --   GLUCOSE 126* 113* 85 81 98 90  --   BUN '23 21 18 14 11 11  '$ --   CREATININE 1.05* 0.91 0.92 0.74 0.76 0.78  --   CALCIUM 8.7* 8.5* 7.9* 8.3* 8.3* 8.4*  --   MG 1.9  --   --  1.9 1.9 1.9  --    Liver Function Tests: Recent Labs  Lab 05/04/22 1345  AST  18  ALT 9  ALKPHOS 120  BILITOT 0.9  PROT 6.8  ALBUMIN 3.1*   Recent Labs  Lab 05/04/22 1345  LIPASE  21   No results for input(s): AMMONIA in the last 168 hours. CBC: Recent Labs  Lab 05/04/22 0922 05/06/22 0421 05/07/22 0333 05/08/22 0426 05/09/22 0606  WBC 6.6 5.2 5.5 5.8 5.6  HGB 10.6* 8.4* 8.1* 8.7* 8.4*  HCT 34.0* 27.2* 26.2* 27.8* 26.8*  MCV 80.2 80.0 79.2* 79.4* 78.4*  PLT 407* 301 267 251 238   Cardiac Enzymes: No results for input(s): CKTOTAL, CKMB, CKMBINDEX, TROPONINI in the last 168 hours. BNP: Invalid input(s): POCBNP CBG: No results for input(s): GLUCAP in the last 168 hours. D-Dimer No results for input(s): DDIMER in the last 72 hours. Hgb A1c No results for input(s): HGBA1C in the last 72 hours. Lipid Profile No results for input(s): CHOL, HDL, LDLCALC, TRIG, CHOLHDL, LDLDIRECT in the last 72 hours. Thyroid function studies No results for input(s): TSH, T4TOTAL, T3FREE, THYROIDAB in the last 72 hours.  Invalid input(s): FREET3 Anemia work up No results for input(s): VITAMINB12, FOLATE, FERRITIN, TIBC, IRON, RETICCTPCT in the last 72 hours. Urinalysis    Component Value Date/Time   COLORURINE YELLOW (A) 05/07/2022 0355   APPEARANCEUR CLEAR (A) 05/07/2022 0355   APPEARANCEUR Cloudy 09/18/2012 2000   LABSPEC 1.013 05/07/2022 0355   LABSPEC 1.012 09/18/2012 2000   PHURINE 6.0 05/07/2022 0355   GLUCOSEU NEGATIVE 05/07/2022 0355   GLUCOSEU Negative 09/18/2012 2000   HGBUR NEGATIVE 05/07/2022 0355   BILIRUBINUR NEGATIVE 05/07/2022 0355   BILIRUBINUR negative 10/29/2021 1110   BILIRUBINUR negative 04/08/2019 1103   BILIRUBINUR Negative 09/18/2012 2000   KETONESUR NEGATIVE 05/07/2022 0355   PROTEINUR NEGATIVE 05/07/2022 0355   UROBILINOGEN 0.2 10/29/2021 1110   UROBILINOGEN 0.2 05/25/2017 1743   NITRITE NEGATIVE 05/07/2022 0355   LEUKOCYTESUR NEGATIVE 05/07/2022 0355   LEUKOCYTESUR Trace 09/18/2012 2000   Sepsis Labs Invalid input(s): PROCALCITONIN,  WBC,  LACTICIDVEN Microbiology No results found for this or any previous visit (from the past 240  hour(s)).   Time coordinating discharge: Over 30 minutes  SIGNED:   Nolberto Hanlon, MD  [facility] 05/10/2022, 2:17 PM Pager   If 7PM-7AM, please contact night-coverage [email or web address] Password [email or web address]

## 2022-05-10 NOTE — Progress Notes (Signed)
Occupational Therapy Treatment Patient Details Name: MASSA PE MRN: [id number] DOB: 1951/04/12 Today's Date: 05/10/2022   History of present illness Pt is a 71 y.o. female with medical history significant of PUD, gastritis, duodenitis, hemiplegic migraine with mild left-sided weakness, hypertension, hyperlipidemia, COPD on 3L oxygen, GERD, depression, anxiety, Parkinson's disease, CHF, anemia, CKD-3A who presents to [facility] ED on 5/19 with epigastric abdominal pain, intractable nausea and dry heaves, near syncope. EGD on 5/22. MD assessment includes: nausea & vomiting with etiology unclear, anemia, near syncope, and hypokalemia.   OT comments  Pt seen for OT tx this date. Pt endorses feeling a bit better, having had nausea medicine. Pt completed bed mobility with mod indep, sat EOB to don socks with set up, and stood with supervision and completed step pivot to the recliner. Afterwards, pt educated in ECS and BSC use overnight 2/2 decreased activity tolerance and PRE of 7/10 simply performing a step pivot from the EOB to the recliner. Pt verbalized understanding. Due to significantly impaired activity tolerance and weakness, continue to recommend SNF at this time.     Recommendations for follow up therapy are one component of a multi-disciplinary discharge planning process, led by the attending physician.  Recommendations may be updated based on patient status, additional functional criteria and insurance authorization.    Follow Up Recommendations  Skilled nursing-short term rehab (<3 hours/day)    Assistance Recommended at Discharge Frequent or constant Supervision/Assistance  Patient can return home with the following  A little help with walking and/or transfers;A little help with bathing/dressing/bathroom;Assistance with cooking/housework;Assist for transportation;Direct supervision/assist for medications management;Help with stairs or ramp for entrance   Equipment Recommendations   BSC/3in1;Other (comment) (2WW)    Recommendations for Other Services      Precautions / Restrictions Precautions Precautions: Fall Restrictions Weight Bearing Restrictions: No       Mobility Bed Mobility Overal bed mobility: Modified Independent                  Transfers Overall transfer level: Needs assistance Equipment used: Rolling walker (2 wheels) Transfers: Sit to/from Stand Sit to Stand: Supervision                 Balance Overall balance assessment: Needs assistance Sitting-balance support: Feet supported, No upper extremity supported Sitting balance-Leahy Scale: Good     Standing balance support: Bilateral upper extremity supported, Reliant on assistive device for balance, During functional activity Standing balance-Leahy Scale: Fair                             ADL either performed or assessed with clinical judgement   ADL Overall ADL's : Needs assistance/impaired                     Lower Body Dressing: Set up;Supervision/safety;Sitting/lateral leans Lower Body Dressing Details (indicate cue type and reason): donned socks EOB                    Extremity/Trunk Assessment              Vision       Perception     Praxis      Cognition Arousal/Alertness: Awake/alert Behavior During Therapy: WFL for tasks assessed/performed Overall Cognitive Status: Within Functional Limits for tasks assessed  Exercises Other Exercises Other Exercises: Pt educated in ECS and BSC use overnight 2/2 decreased activity tolerance and PRE of 7/10 simply performing a step pivot from the EOB to the recliner.    Shoulder Instructions       General Comments      Pertinent Vitals/ Pain       Pain Assessment Pain Assessment: No/denies pain  Home Living                                          Prior Functioning/Environment               Frequency  Min 2X/week        Progress Toward Goals  OT Goals(current goals can now be found in the care plan section)  Progress towards OT goals: Progressing toward goals  Acute Rehab OT Goals Patient Stated Goal: feel better and go home OT Goal Formulation: With patient Time For Goal Achievement: 05/22/22 Potential to Achieve Goals: Good  Plan Discharge plan remains appropriate;Frequency remains appropriate    Co-evaluation                 AM-PAC OT "6 Clicks" Daily Activity     Outcome Measure   Help from another person eating meals?: None Help from another person taking care of personal grooming?: None Help from another person toileting, which includes using toliet, bedpan, or urinal?: A Little Help from another person bathing (including washing, rinsing, drying)?: A Little Help from another person to put on and taking off regular upper body clothing?: None Help from another person to put on and taking off regular lower body clothing?: None 6 Click Score: 22    End of Session Equipment Utilized During Treatment: Rolling walker (2 wheels);Oxygen  OT Visit Diagnosis: Other abnormalities of gait and mobility (R26.89);Repeated falls (R29.6);Muscle weakness (generalized) (M62.81)   Activity Tolerance Patient tolerated treatment well   Patient Left in chair;with call bell/phone within reach   Nurse Communication          Time: 2585-2778 OT Time Calculation (min): 11 min  Charges: OT General Charges $OT Visit: 1 Visit OT Treatments $Self Care/Home Management : 8-22 mins  Ardeth Perfect., MPH, MS, OTR/L ascom [phone] 05/10/22, 10:32 AM

## 2022-05-10 NOTE — Progress Notes (Signed)
Physical Therapy Treatment Patient Details Name: Morgan [name] MRN: [id number] DOB: 06/13/1951 Today's Date: 05/10/2022   History of Present Illness Pt is a 71 y.o. female with medical history significant of PUD, gastritis, duodenitis, hemiplegic migraine with mild left-sided weakness, hypertension, hyperlipidemia, COPD on 3L oxygen, GERD, depression, anxiety, Parkinson's disease, CHF, anemia, CKD-3A who presents to [facility] ED on 5/19 with epigastric abdominal pain, intractable nausea and dry heaves, near syncope. EGD on 5/22. MD assessment includes: nausea & vomiting with etiology unclear, anemia, near syncope, and hypokalemia.    PT Comments    Pt is making good progress towards goals with ability to improve functional independence and demonstrate safe ambulation in room. Practiced distance that ramp leads to home. Educated on placing chair at home entrance for rest break. All mobility performed on 3L of O2 with sats maintaining at 92% with exertion. Good endurance with there-ex. Will continue to progress. Updated dc recs to HHPT with frequent supervision.  Recommendations for follow up therapy are one component of a multi-disciplinary discharge planning process, led by the attending physician.  Recommendations may be updated based on patient status, additional functional criteria and insurance authorization.  Follow Up Recommendations  Home health PT     Assistance Recommended at Discharge Frequent or constant Supervision/Assistance  Patient can return home with the following A little help with walking and/or transfers;A little help with bathing/dressing/bathroom;Help with stairs or ramp for entrance   Equipment Recommendations  Rolling walker (2 wheels);BSC/3in1    Recommendations for Other Services       Precautions / Restrictions Precautions Precautions: Fall Restrictions Weight Bearing Restrictions: No     Mobility  Bed Mobility               General bed mobility  comments: NT, received up in chair    Transfers Overall transfer level: Needs assistance Equipment used: Rolling walker (2 wheels) Transfers: Sit to/from Stand Sit to Stand: Supervision           General transfer comment: imroved technique with upright posture. O2 donned for all mobility    Ambulation/Gait Ambulation/Gait assistance: Min guard Gait Distance (Feet): 20 Feet Assistive device: Rolling walker (2 wheels) Gait Pattern/deviations: Step-through pattern, Decreased step length - right, Decreased step length - left       General Gait Details: effortful gait and with increased SOB symptoms. 3L of O2 with sats at 92% post mobility.   Stairs             Wheelchair Mobility    Modified Rankin (Stroke Patients Only)       Balance Overall balance assessment: Needs assistance Sitting-balance support: Feet supported, No upper extremity supported Sitting balance-Leahy Scale: Good     Standing balance support: Bilateral upper extremity supported, Reliant on assistive device for balance, During functional activity Standing balance-Leahy Scale: Fair                              Cognition Arousal/Alertness: Awake/alert Behavior During Therapy: WFL for tasks assessed/performed Overall Cognitive Status: Within Functional Limits for tasks assessed                                          Exercises Other Exercises Other Exercises: seated ther-ex performed on B LE including quad sets, SLRs, hip abd/add, hip add squeezes, glut sets, SAQs. 10  reps with cga. Safe technique with O2 sats monitored throughout    General Comments        Pertinent Vitals/Pain Pain Assessment Pain Assessment: No/denies pain    Home Living                          Prior Function            PT Goals (current goals can now be found in the care plan section) Acute Rehab PT Goals Patient Stated Goal: To get stronger PT Goal Formulation:  With patient Time For Goal Achievement: 05/21/22 Potential to Achieve Goals: Good Progress towards PT goals: Progressing toward goals    Frequency    Min 2X/week      PT Plan Discharge plan needs to be updated    Co-evaluation              AM-PAC PT "6 Clicks" Mobility   Outcome Measure  Help needed turning from your back to your side while in a flat bed without using bedrails?: A Little Help needed moving from lying on your back to sitting on the side of a flat bed without using bedrails?: A Little Help needed moving to and from a bed to a chair (including a wheelchair)?: A Little Help needed standing up from a chair using your arms (e.g., wheelchair or bedside chair)?: A Little Help needed to walk in hospital room?: A Little Help needed climbing 3-5 steps with a railing? : A Lot 6 Click Score: 17    End of Session Equipment Utilized During Treatment: Gait belt Activity Tolerance: Patient tolerated treatment well Patient left: in chair;with chair alarm set Nurse Communication: Mobility status PT Visit Diagnosis: Difficulty in walking, not elsewhere classified (R26.2);Muscle weakness (generalized) (M62.81)     Time: 6578-4696 PT Time Calculation (min) (ACUTE ONLY): 23 min  Charges:  $Gait Training: 8-22 mins $Therapeutic Exercise: 8-22 mins                     Morgan [name], PT, DPT, GCS [phone]    Morgan [name] 05/10/2022, 11:45 AM

## 2022-05-10 NOTE — TOC CM/SW Note (Signed)
Patient is not able to walk the distance required to go the bathroom, or he/she is unable to safely negotiate stairs required to access the bathroom.  A 3in1 BSC will alleviate this problem  

## 2022-05-10 NOTE — TOC Transition Note (Addendum)
Transition of Care [facility]) - CM/SW Discharge Note   Patient Details  Name: Morgan [name] MRN: [id number] Date of Birth: Jan 11, 1951  Transition of Care [facility] CM/SW Contact:  Candie Chroman, LCSW Phone Number: 05/10/2022, 10:10 AM   Clinical Narrative:  Patient has orders to discharge home today. Called patient in the room and she confirmed she prefers home health. No agency preference. She last worked with [employer] and they are able to accept her back for PT, OT, RN, aide. She has a rollator and cane at home. Declined RW. She does have a ride home today and they will bring her home oxygen. No further concerns. CSW signing off.   11:06 am: OT is recommending a 3-in-1. Patient is agreeable. Ordered through [employer]. CSW signing off.  Final next level of care: [place] Barriers to Discharge: Barriers Resolved   Patient Goals and CMS Choice Patient states their goals for this hospitalization and ongoing recovery are:: To return home   Choice offered to / list presented to : Patient  Discharge Placement                    Patient and family notified of of transfer: 05/10/22  Discharge Plan and Services                          HH Arranged: RN, PT, OT, Nurse's Aide [place] Agency: [place] Date [place]: 05/10/22   Representative spoke with at [place]: Sharmon Revere  Social Determinants of Health (SDOH) Interventions     Readmission Risk Interventions     View : No data to display.

## 2022-05-15 ENCOUNTER — Ambulatory Visit: Payer: Medicare HMO | Admitting: Pulmonary Disease

## 2022-05-15 ENCOUNTER — Encounter: Payer: Self-pay | Admitting: Pulmonary Disease

## 2022-05-15 ENCOUNTER — Inpatient Hospital Stay: Payer: Medicare HMO | Admitting: Family Medicine

## 2022-05-15 ENCOUNTER — Telehealth: Payer: Self-pay | Admitting: Family Medicine

## 2022-05-15 VITALS — BP 130/70 | HR 81 | Temp 98.5°F | Ht 73.0 in | Wt 206.2 lb

## 2022-05-15 DIAGNOSIS — R0609 Other forms of dyspnea: Secondary | ICD-10-CM

## 2022-05-15 DIAGNOSIS — J9611 Chronic respiratory failure with hypoxia: Secondary | ICD-10-CM

## 2022-05-15 NOTE — Telephone Encounter (Signed)
Please call patient to complete TCM call. She was discharged from [facility] on 05/26

## 2022-05-15 NOTE — Patient Instructions (Signed)
Nice to see you again  Please use Breztri 2 puffs in the morning and 2 puffs in the evening.  Prescription is at the [facility] in [place].  Please make sure this is filled.  Please let me know if too expensive.  Please check your oxygen saturation.  I recommend you buy a pulse oximeter or oxygen monitor.  Put this on your finger in the wear it as you move around the house.  Please make sure your oxygen saturation stays at least 88%.  If it drops to 88% please put on oxygen or increase your oxygen as needed.  Please let me know how much oxygen you need to keep your oxygen above 88% when you are moving around.  We will get pulmonary function test at your next visit.  Will be a longer day but we can go over the results the same day of the test.  Return to clinic in 2 months with PFTs scheduled same day prior to visit with Dr. Silas Flood.

## 2022-05-15 NOTE — Addendum Note (Signed)
Addended by: Retia Passe on: 05/15/2022 09:41 AM   Modules accepted: Orders

## 2022-05-15 NOTE — Progress Notes (Signed)
$'@Patient'g$  ID: Morgan [name], female    DOB: [birth year], 71 y.o.   MRN: [id number]  Chief Complaint  Patient presents with   Follow-up    Pt states that cough has resolved. But she was just discharged from the hospital on Thursday. Pt is not taking Breztri. She is on oxygen at night and needs a new concentrator.     Referring provider: [name], MD  HPI:   71 y.o. woman whom we are seeing in follow up for evaluation of chronic cough/bronchitis.  Most recent from PCP reviewed. Recent d/c summary reviewed.  Was admitted and discharged a few days ago with nausea and vomiting.  Work-up was unremarkable.  Imaging, EGD largely unremarkable.  No evidence of H. pylori or malignancy on biopsy results which were reviewed.  At last visit, she was seen for initial consultation with dyspnea on exertion.  History of asthma.  Concern for smoking-related disease as well.  Sounds like breathing has worsened after viral illness.  Her inhaler therapy was escalated to [facility].  Is unclear if she is actually using Breztri.  She does not recall if she picked it up or not.  In the interim since last visit she did get the antibiotic.  Cough has improved.  But again unclear if she was using Breztri.  I suspect not.  I suspect cough improvement cannot be related to [facility] given concerns for nonadherence.  She still dyspneic.  She reports intermittent oxygen desaturations.  Not sure how she knows about this as she says she is not checking her oxygen at home.  She does feel dizzy sometimes she walks.  HPI at initial visit: Patient reports onset of symptoms about 2 months ago.  Started with fever, runny nose, sore throat.  The symptoms resolved but cough persisted.  Productive.  Initially yellow to clear phlegm.  Worsened over the last months to more green phlegm.  Associate with worsening dyspnea on exertion.  No times a day with things are better or worse.  No position to make things better or worse.  She thinks  the pollens have made nasal congestion and some cough worse given chronic issues with pollens and nasal/sinus congestion with seasonal changes in the past.  Has been using Advair without much improvement.  Was given a course of prednisone and Augmentin about 1 month ago.  This did help for period of time but symptoms have recurred and back to as bad as they were prior.  Was placed on prednisone and levofloxacin yesterday via PCP.  Review chest x-ray 03/14/2022 that on my review and interpretation reveals hyperinflation with what appears to be chronic bronchial scarring/inflammation.  Review chest x-ray 04/12/2022 that on my interpretation is unchanged from earlier x-ray in March.  PMH: Tobacco abuse in remission, seasonal allergies, hypertension Surgical history: Hysterectomy, appendectomy, esophageal dilation, knee surgery, cholecystectomy Family history: Mother with history of breast cancer, ovarian cancer, father with hypertension, hyperlipidemia, CAD, colon cancer in 18s Social history: Former smoker, Printmaker, quit 2015, retired Futures trader / Pulmonary Flowsheets:   ACT:      View : No data to display.          MMRC:     View : No data to display.          [facility]worth:      View : No data to display.          Tests:   FENO:  No results found for: NITRICOXIDE  PFT:  View : No data to display.          WALK:      View : No data to display.          Imaging: Personally reviewed and as per EMR discussion this note CT ABDOMEN PELVIS W CONTRAST  Result Date: 05/04/2022 CLINICAL DATA:  Abdominal pain, vomiting. EXAM: CT ABDOMEN AND PELVIS WITH CONTRAST TECHNIQUE: Multidetector CT imaging of the abdomen and pelvis was performed using the standard protocol following bolus administration of intravenous contrast. RADIATION DOSE REDUCTION: This exam was performed according to the departmental dose-optimization program which includes automated exposure  control, adjustment of the mA and/or kV according to patient size and/or use of iterative reconstruction technique. CONTRAST:  17m OMNIPAQUE IOHEXOL 300 MG/ML  SOLN COMPARISON:  January 18, 2022. FINDINGS: Lower chest: No acute abnormality. Hepatobiliary: No focal liver abnormality is seen. Status post cholecystectomy. Mild intrahepatic and extrahepatic biliary dilatation is noted most likely due to post cholecystectomy status. Pancreas: Unremarkable. No pancreatic ductal dilatation or surrounding inflammatory changes. Spleen: Normal in size without focal abnormality. Adrenals/Urinary Tract: Adrenal glands are unremarkable. Kidneys are normal, without renal calculi, focal lesion, or hydronephrosis. Bladder is unremarkable. Stomach/Bowel: The stomach appears normal. There is no evidence of bowel obstruction or inflammation. Status post appendectomy. Sigmoid diverticulosis without inflammation. Vascular/Lymphatic: Aortic atherosclerosis. No enlarged abdominal or pelvic lymph nodes. Reproductive: Status post hysterectomy. No adnexal masses. Other: No abdominal wall hernia or abnormality. No abdominopelvic ascites. Musculoskeletal: Status post T8-T9 kyphoplasty. No acute abnormality seen. IMPRESSION: Sigmoid diverticulosis without inflammation. No acute abnormality seen in the abdomen or pelvis. Aortic Atherosclerosis (ICD10-I70.0). Electronically Signed   By: JMarijo ConceptionM.D.   On: 05/04/2022 14:31   DG Abd Portable 1 View  Result Date: 05/04/2022 CLINICAL DATA:  Possible small bowel obstruction. Abdominal pain and dry heaving beginning today. EXAM: PORTABLE ABDOMEN - 1 VIEW COMPARISON:  08/10/2020 FINDINGS: Bowel gas pattern is nonobstructive. Air and mild stool present throughout the colon. No dilated small bowel loops. No free peritoneal air. Previous kyphoplasties of T9 and T10. Several pelvic phleboliths. IMPRESSION: Nonobstructive bowel gas pattern. Electronically Signed   By: DMarin OlpM.D.   On:  05/04/2022 13:44    Lab Results: Personally reviewed CBC    Component Value Date/Time   WBC 5.6 05/09/2022 0606   RBC 3.42 (L) 05/09/2022 0606   HGB 8.4 (L) 05/09/2022 0606   HGB 12.4 09/18/2012 2000   HCT 26.8 (L) 05/09/2022 0606   HCT 37.3 09/18/2012 2000   PLT 238 05/09/2022 0606   PLT 450 (H) 09/18/2012 2000   MCV 78.4 (L) 05/09/2022 0606   MCV 86 09/18/2012 2000   MCH 24.6 (L) 05/09/2022 0606   MCHC 31.3 05/09/2022 0606   RDW 14.4 05/09/2022 0606   RDW 14.5 09/18/2012 2000   LYMPHSABS 2.8 04/27/2022 0911   MONOABS 1.1 (H) 04/27/2022 0911   EOSABS 0.1 04/27/2022 0911   BASOSABS 0.1 04/27/2022 0911    BMET    Component Value Date/Time   NA 136 05/09/2022 0606   NA 143 12/26/2018 0000   NA 139 09/18/2012 2000   K 3.3 (L) 05/10/2022 0552   K 3.3 (L) 09/18/2012 2000   CL 96 (L) 05/09/2022 0606   CL 99 09/18/2012 2000   CO2 33 (H) 05/09/2022 0606   CO2 33 (H) 09/18/2012 2000   GLUCOSE 90 05/09/2022 0606   GLUCOSE 111 (H) 09/18/2012 2000   BUN 11 05/09/2022 0606   BUN  16 12/26/2018 0000   BUN 13 09/18/2012 2000   CREATININE 0.78 05/09/2022 0606   CREATININE 0.72 09/18/2012 2000   CALCIUM 8.4 (L) 05/09/2022 0606   CALCIUM 9.4 09/18/2012 2000   GFRNONAA >60 05/09/2022 0606   GFRNONAA >60 09/18/2012 2000   GFRAA >60 09/02/2020 1658   GFRAA >60 09/18/2012 2000    BNP    Component Value Date/Time   BNP 69.0 05/04/2022 0922    ProBNP    Component Value Date/Time   PROBNP 180.0 (H) 08/29/2021 0931    Specialty Problems       Pulmonary Problems   Oropharyngeal dysphagia   Oxygen dependent    At bedtime       Chronic respiratory failure with hypoxia (HCC)   COPD (chronic obstructive pulmonary disease) (HCC)   DOE (dyspnea on exertion)    Allergies  Allergen Reactions   Meperidine Hives and Anaphylaxis   Strawberry Extract Hives and Swelling   Sucralfate Nausea Only and Other (See Comments)    Severe nausea Other reaction(s): Other (See  Comments) Severe nausea Severe nausea   Wasp Venom Hives    Immunization History  Administered Date(s) Administered   Fluad Quad(high Dose 65+) 09/30/2019, 08/29/2020, 08/28/2021   Influenza, High Dose Seasonal PF 08/23/2018   Influenza,inj,Quad PF,6+ Mos 02/24/2015   Moderna Sars-Covid-2 Vaccination 03/18/2020, 04/15/2020   Pneumococcal Conjugate-13 11/12/2018   Pneumococcal Polysaccharide-23 02/24/2015    Past Medical History:  Diagnosis Date   Allergy    Anemia    Anxiety    Arthritis    CHF (congestive heart failure) (HCC)    Depression    GERD (gastroesophageal reflux disease)    Hypertension    MVP (mitral valve prolapse)    Parkinson disease (HCC)     Tobacco History: Social History   Tobacco Use  Smoking Status Former   Packs/day: 1.00   Years: 15.00   Pack years: 15.00   Types: Cigarettes   Quit date: 09/12/2017   Years since quitting: 4.6  Smokeless Tobacco Never   Counseling given: Not Answered   Continue to not smoke  Outpatient Encounter Medications as of 05/15/2022  Medication Sig   acetaminophen (TYLENOL) 325 MG tablet Take 2 tablets (650 mg total) by mouth every 4 (four) hours as needed for mild pain (or temp > 37.5 C (99.5 F)).   AIMOVIG 140 MG/ML SOAJ SMARTSIG:140 Milligram(s) SUB-Q Once a Month   aspirin-acetaminophen-caffeine (EXCEDRIN MIGRAINE) 250-250-65 MG tablet Take 1-2 tablets by mouth every 6 (six) hours as needed for headache.   atorvastatin (LIPITOR) 40 MG tablet TAKE 1 TABLET BY MOUTH EVERY DAY   Budeson-Glycopyrrol-Formoterol (BREZTRI AEROSPHERE) 160-9-4.8 MCG/ACT AERO Inhale 2 puffs into the lungs in the morning and at bedtime.   carbidopa-levodopa (SINEMET IR) 25-100 MG tablet Take 2 tablets by mouth 3 (three) times daily.   Cholecalciferol 1.25 MG (50000 UT) TABS Take 1 tablet by mouth once a week.   diphenhydrAMINE (BENADRYL) 50 MG/ML injection Inject 0.25 mLs (12.5 mg total) into the vein every 8 (eight) hours as needed for up  to 5 days (nausea vomiting).   fluticasone (FLONASE) 50 MCG/ACT nasal spray Place 1 spray into both nostrils daily.   metoCLOPramide (REGLAN) 5 MG tablet Take 1 tablet (5 mg total) by mouth 4 (four) times daily -  before meals and at bedtime for 10 days.   montelukast (SINGULAIR) 10 MG tablet Take 1 tablet (10 mg total) by mouth at bedtime.   OXYGEN Inhale 3 L/min into  the lungs See admin instructions. 3 L/min at bedtime and as needed for shortness of breath throughout the day   pantoprazole (PROTONIX) 40 MG tablet Take 1 tablet (40 mg total) by mouth 2 (two) times daily before a meal.   scopolamine (TRANSDERM-SCOP) 1 MG/3DAYS Place 1 patch (1.5 mg total) onto the skin every 3 (three) days.   sucralfate (CARAFATE) 1 GM/10ML suspension Take 10 mLs (1 g total) by mouth 2 (two) times daily.   No facility-administered encounter medications on file as of 05/15/2022.     Review of Systems  Review of Systems  N/a Physical Exam  BP 130/70 (BP Location: Left Arm, Patient Position: Sitting, Cuff Size: Normal)   Pulse 81   Temp 98.5 F (36.9 C) (Oral)   Ht '6\' 1"'$  (1.854 m)   Wt 206 lb 3.2 oz (93.5 kg)   SpO2 97%   BMI 27.20 kg/m   Wt Readings from Last 5 Encounters:  05/15/22 206 lb 3.2 oz (93.5 kg)  05/04/22 200 lb (90.7 kg)  04/26/22 206 lb 2 oz (93.5 kg)  04/13/22 214 lb 9.6 oz (97.3 kg)  04/12/22 212 lb 7 oz (96.4 kg)    BMI Readings from Last 5 Encounters:  05/15/22 27.20 kg/m  05/04/22 26.39 kg/m  04/26/22 27.19 kg/m  04/13/22 28.31 kg/m  04/12/22 28.03 kg/m     Physical Exam General: Sitting in chair, no acute distress Eyes: EOMI, icterus Neck: Supple, no JVP Pulmonary: Distant, clear Abdomen: Nondistended, bowel sounds present CV: Regular rate and rhythm, no murmur MSK: No synovitis, joint effusion Neuro: Normal gait, no weakness Psych: Normal mood, full affect   Assessment & Plan:   Recurrent bronchitis/chronic cough: Query if related to smoking lung  disease, no PFTs for formal evaluation. Felt less likely given lack of preceding cough. Mild and fleeting improvement with prednisone and antibiotics.  However, since last visit she got a course of Levaquin and cough is now gone.  Do not think she is using the [facility] that was prescribed.  Given her ongoing dyspnea, I do think inhaler therapy is warranted.  She was encouraged to use Breztri 2 puffs twice daily.   Notably, eosinophils have been elevated as high as 400 in the past.  In conjunction with seasonal allergies, query possibility of asthma with emergence with what sounds like viral illness 2 months prior to symptom onset.  Dyspnea on exertion: Worse with recent illness although preceded current issues.  High suspicion for asthma versus smoking-related disease as discussed above.  PFTs for formal evaluation in the future when feeling better, hope to obtain in about 2 months.  Hoping for symptomatic improvement with Breztri as above.  Chronic hypoxemic respiratory failure: Using at night.  Now some during the day.  She does not know what her oxygen saturation is.  Encouraged her to check her oxygen saturation with an O2 monitor.  Encouraged to keep it over 88%.  If she is needed with exertion, please contact us and let us know how much she is needing.  Using courage to increase oxygen flow rate to achieve an oxygen saturation of at least 88%.   Return in about 2 months (around 07/15/2022).   Lanier Clam, MD 05/15/2022

## 2022-05-15 NOTE — Telephone Encounter (Signed)
Pt has hospital f/u appt today

## 2022-05-16 DIAGNOSIS — R55 Syncope and collapse: Secondary | ICD-10-CM | POA: Diagnosis not present

## 2022-05-16 NOTE — Telephone Encounter (Signed)
Yes, because she didn't show up yesterday

## 2022-05-17 ENCOUNTER — Telehealth: Payer: Self-pay | Admitting: Pulmonary Disease

## 2022-05-17 ENCOUNTER — Ambulatory Visit (INDEPENDENT_AMBULATORY_CARE_PROVIDER_SITE_OTHER): Payer: Medicare HMO | Admitting: Family Medicine

## 2022-05-17 VITALS — BP 100/60 | HR 86 | Temp 96.9°F | Wt 207.2 lb

## 2022-05-17 DIAGNOSIS — D509 Iron deficiency anemia, unspecified: Secondary | ICD-10-CM | POA: Diagnosis not present

## 2022-05-17 DIAGNOSIS — G20A1 Parkinson's disease without dyskinesia, without mention of fluctuations: Secondary | ICD-10-CM

## 2022-05-17 DIAGNOSIS — J449 Chronic obstructive pulmonary disease, unspecified: Secondary | ICD-10-CM

## 2022-05-17 DIAGNOSIS — G20 Parkinson's disease: Secondary | ICD-10-CM

## 2022-05-17 DIAGNOSIS — J9611 Chronic respiratory failure with hypoxia: Secondary | ICD-10-CM | POA: Diagnosis not present

## 2022-05-17 DIAGNOSIS — R112 Nausea with vomiting, unspecified: Secondary | ICD-10-CM | POA: Diagnosis not present

## 2022-05-17 DIAGNOSIS — I5032 Chronic diastolic (congestive) heart failure: Secondary | ICD-10-CM

## 2022-05-17 DIAGNOSIS — N1831 Chronic kidney disease, stage 3a: Secondary | ICD-10-CM

## 2022-05-17 LAB — CBC
HCT: 32.2 % — ABNORMAL LOW (ref 36.0–46.0)
Hemoglobin: 10.1 g/dL — ABNORMAL LOW (ref 12.0–15.0)
MCHC: 31.5 g/dL (ref 30.0–36.0)
MCV: 79.9 fl (ref 78.0–100.0)
Platelets: 267 10*3/uL (ref 150.0–400.0)
RBC: 4.03 Mil/uL (ref 3.87–5.11)
RDW: 16.1 % — ABNORMAL HIGH (ref 11.5–15.5)
WBC: 8.4 10*3/uL (ref 4.0–10.5)

## 2022-05-17 LAB — BASIC METABOLIC PANEL
BUN: 15 mg/dL (ref 6–23)
CO2: 27 mEq/L (ref 19–32)
Calcium: 8.8 mg/dL (ref 8.4–10.5)
Chloride: 98 mEq/L (ref 96–112)
Creatinine, Ser: 1.12 mg/dL (ref 0.40–1.20)
GFR: 50.39 mL/min — ABNORMAL LOW (ref >60.00)
Glucose, Bld: 92 mg/dL (ref 70–99)
Potassium: 3.2 mEq/L — ABNORMAL LOW (ref 3.5–5.1)
Sodium: 137 mEq/L (ref 135–145)

## 2022-05-17 NOTE — Patient Instructions (Addendum)
Please call to schedule follow-up with GI doctor to discuss the nausea and vomiting  Home health  - hopefully hear by Monday

## 2022-05-17 NOTE — Progress Notes (Signed)
Subjective:     Morgan [name] is a 71 y.o. female presenting for Hospitalization Follow-up     HPI  #Tremor - seeing Dr. Nehemiah Massed with Cardiology - he thinks that the parkinson's is farther along - parkinson's tremor seems to be getting worse instead of better - is increasing w/o improvement - seeing Neurology - Dr. Manuella Ghazi  #CHF - has stopped all her medication  - cardiology advised she remain off the medicaiton - is wearing a zio patch - they couldn't find her pulse in the ambulance so she is getting monitored  #Pulmonology - seeing the specialist - getting lung testing done - is suppose to get a pulse   #Nausea/vomiting - fell Thursday night - legs went out - using the scoplamine patch - still using the reglan with meals w/ some improvement  Vacation June 15-19th  Review of Systems  5/19-5/25/2023: Hosptial - N/V w/o cause - reglan prior to bed, small frequent meals, protonix 40 mg bid, suclrafate  - needs appt with GI  Social History   Tobacco Use  Smoking Status Former   Packs/day: 1.00   Years: 15.00   Pack years: 15.00   Types: Cigarettes   Quit date: 09/12/2017   Years since quitting: 4.6  Smokeless Tobacco Never        Objective:    BP Readings from Last 3 Encounters:  05/17/22 100/60  05/15/22 130/70  05/10/22 112/68   Wt Readings from Last 3 Encounters:  05/17/22 207 lb 4 oz (94 kg)  05/15/22 206 lb 3.2 oz (93.5 kg)  05/04/22 200 lb (90.7 kg)    BP 100/60   Pulse 86   Temp (!) 96.9 F (36.1 C) (Temporal)   Wt 207 lb 4 oz (94 kg)   SpO2 98%   BMI 27.34 kg/m    Physical Exam Constitutional:      General: She is not in acute distress.    Appearance: She is well-developed. She is not diaphoretic.  HENT:     Right Ear: External ear normal.     Left Ear: External ear normal.     Nose: Nose normal.  Eyes:     Conjunctiva/sclera: Conjunctivae normal.  Cardiovascular:     Rate and Rhythm: Normal rate and regular rhythm.      Heart sounds: Murmur heard.  Pulmonary:     Effort: Pulmonary effort is normal. No respiratory distress.     Breath sounds: Normal breath sounds. No wheezing.  Musculoskeletal:     Cervical back: Neck supple.     Right lower leg: No edema.     Left lower leg: No edema.  Skin:    General: Skin is warm and dry.     Capillary Refill: Capillary refill takes less than 2 seconds.  Neurological:     Mental Status: She is alert. Mental status is at baseline.  Psychiatric:        Mood and Affect: Mood normal.        Behavior: Behavior normal.          Assessment & Plan:   Problem List Items Addressed This Visit       Cardiovascular and Mediastinum   CHF (congestive heart failure) ([place]) - Primary    Appears euvolemic today. Appreciate cardiology support. Not currently taking lasix, agree with remaining off. Labs today to assess electrolytes. Breathing is stable.        Relevant Orders   Ambulatory referral to [place]  Respiratory   COPD (chronic obstructive pulmonary disease) ([place])   Relevant Orders   Ambulatory referral to Home Health   Chronic respiratory failure with hypoxia [facility])    Following with pulmonology. Using oxygen as needed at home and overnight. She will get pulse ox. Normal today and on RA. Getting PFTs in 2 months to help with diagnosis. Cont Breztri inhaler and prn albuterol.        Relevant Orders   Ambulatory referral to [place]     Digestive   Nausea and vomiting in adult    Symptoms persist after hospitalization. Encouraged scheduling with GI - Dr. Haig Prophet with [facility]. Cont scopolamine and reglan as these are helping. PO intake.        Relevant Orders   Ambulatory referral to [place] and Auditory   Parkinson's disease [facility])    She is concerned her symptoms are worsening. Also possible that sinemet is contributing to her hypotension and orthostatics. Has f/u with neurology tomorrow - appreciate Dr. Trena Platt support. Cont  sinemet pending neuro eval.        Relevant Orders   Ambulatory referral to [place]     Genitourinary   Stage 3a chronic kidney disease ([place])   Relevant Orders   Basic metabolic panel     Other   Iron deficiency anemia    Following with hematology. Blood counts lower in the hospital. Likely also associated with CKD. Appreciate hematology support. Repeat CBC today to assess for change. Advised hematology f/u as pt has missed several.  Lab Results  Component Value Date   FERRITIN 187 12/19/2021        Relevant Orders   CBC       Return in about 2 weeks (around 05/31/2022).  [name], MD

## 2022-05-17 NOTE — Assessment & Plan Note (Addendum)
She is concerned her symptoms are worsening. Also possible that sinemet is contributing to her hypotension and orthostatics. Has f/u with neurology tomorrow - appreciate Dr. Trena Platt support. Cont sinemet pending neuro eval.

## 2022-05-17 NOTE — Assessment & Plan Note (Signed)
Following with hematology. Blood counts lower in the hospital. Likely also associated with CKD. Appreciate hematology support. Repeat CBC today to assess for change. Advised hematology f/u as pt has missed several.  Lab Results  Component Value Date   FERRITIN 187 12/19/2021

## 2022-05-17 NOTE — Assessment & Plan Note (Signed)
Appears euvolemic today. Appreciate cardiology support. Not currently taking lasix, agree with remaining off. Labs today to assess electrolytes. Breathing is stable.

## 2022-05-17 NOTE — Assessment & Plan Note (Signed)
Symptoms persist after hospitalization. Encouraged scheduling with GI - Dr. Haig Prophet with [place]. Cont scopolamine and reglan as these are helping. PO intake.

## 2022-05-17 NOTE — Assessment & Plan Note (Signed)
Following with pulmonology. Using oxygen as needed at home and overnight. She will get pulse ox. Normal today and on RA. Getting PFTs in 2 months to help with diagnosis. Cont Breztri inhaler and prn albuterol.

## 2022-05-18 DIAGNOSIS — R69 Illness, unspecified: Secondary | ICD-10-CM | POA: Diagnosis not present

## 2022-05-18 DIAGNOSIS — G43419 Hemiplegic migraine, intractable, without status migrainosus: Secondary | ICD-10-CM | POA: Diagnosis not present

## 2022-05-18 DIAGNOSIS — R55 Syncope and collapse: Secondary | ICD-10-CM | POA: Diagnosis not present

## 2022-05-18 DIAGNOSIS — G20 Parkinson's disease: Secondary | ICD-10-CM | POA: Diagnosis not present

## 2022-05-18 DIAGNOSIS — Z8673 Personal history of transient ischemic attack (TIA), and cerebral infarction without residual deficits: Secondary | ICD-10-CM | POA: Diagnosis not present

## 2022-05-18 DIAGNOSIS — R569 Unspecified convulsions: Secondary | ICD-10-CM | POA: Diagnosis not present

## 2022-05-18 NOTE — Telephone Encounter (Signed)
ATC patient on 05/18/2022 to advise her that I placed order for new concentrator fro her night time use of oxygen. Order placed. Patient is unsure who her DME compnay is, that's why I didn't place order on 05/15/22.

## 2022-05-23 ENCOUNTER — Ambulatory Visit (INDEPENDENT_AMBULATORY_CARE_PROVIDER_SITE_OTHER): Payer: Medicare HMO

## 2022-05-23 DIAGNOSIS — E538 Deficiency of other specified B group vitamins: Secondary | ICD-10-CM | POA: Diagnosis not present

## 2022-05-23 MED ORDER — CYANOCOBALAMIN 1000 MCG/ML IJ SOLN
1000.0000 ug | Freq: Once | INTRAMUSCULAR | Status: AC
  Administered 2022-05-23: 1000 ug via INTRAMUSCULAR

## 2022-05-23 NOTE — Progress Notes (Signed)
Per orders of Dr. [name] , injection of B-12 given by Barkley Bruns in right deltoid. Patient tolerated injection well.

## 2022-05-24 ENCOUNTER — Other Ambulatory Visit (INDEPENDENT_AMBULATORY_CARE_PROVIDER_SITE_OTHER): Payer: Self-pay | Admitting: Nurse Practitioner

## 2022-05-24 ENCOUNTER — Ambulatory Visit (INDEPENDENT_AMBULATORY_CARE_PROVIDER_SITE_OTHER): Payer: Medicare HMO | Admitting: Family Medicine

## 2022-05-24 VITALS — BP 124/80 | HR 82 | Temp 97.0°F | Ht 73.0 in | Wt 203.4 lb

## 2022-05-24 DIAGNOSIS — R112 Nausea with vomiting, unspecified: Secondary | ICD-10-CM | POA: Diagnosis not present

## 2022-05-24 DIAGNOSIS — I6523 Occlusion and stenosis of bilateral carotid arteries: Secondary | ICD-10-CM

## 2022-05-24 DIAGNOSIS — G20 Parkinson's disease: Secondary | ICD-10-CM

## 2022-05-24 DIAGNOSIS — E876 Hypokalemia: Secondary | ICD-10-CM

## 2022-05-24 DIAGNOSIS — I739 Peripheral vascular disease, unspecified: Secondary | ICD-10-CM

## 2022-05-24 DIAGNOSIS — I5032 Chronic diastolic (congestive) heart failure: Secondary | ICD-10-CM | POA: Diagnosis not present

## 2022-05-24 LAB — BASIC METABOLIC PANEL
BUN: 7 mg/dL (ref 6–23)
CO2: 30 mEq/L (ref 19–32)
Calcium: 8.4 mg/dL (ref 8.4–10.5)
Chloride: 98 mEq/L (ref 96–112)
Creatinine, Ser: 0.9 mg/dL (ref 0.40–1.20)
GFR: 65.5 mL/min (ref >60.00)
Glucose, Bld: 94 mg/dL (ref 70–99)
Potassium: 3.2 mEq/L — ABNORMAL LOW (ref 3.5–5.1)
Sodium: 138 mEq/L (ref 135–145)

## 2022-05-24 MED ORDER — METOCLOPRAMIDE HCL 5 MG PO TABS: 5.0000 mg | ORAL_TABLET | Freq: Three times a day (TID) | ORAL | 0 refills | Status: DC

## 2022-05-24 NOTE — Assessment & Plan Note (Signed)
Ran out of reglan. Refill Reglan. Cont scoplamine. Prn zofran. Has GI appointment for when she returns from vacation. Appreciate support. Appears well hydrated

## 2022-05-24 NOTE — Patient Instructions (Addendum)
See GI  Restart Reglan  Zofran as needed  Enjoy your vacation   Keep me posted if worsening dizzy spells

## 2022-05-24 NOTE — Assessment & Plan Note (Signed)
Follows with Dr Manuella Ghazi, appreciate support. Sinemet increased. Anticipating biopsies to evaluate further.

## 2022-05-24 NOTE — Assessment & Plan Note (Signed)
Persistent symptoms. On potassium twice daily. No longer on lasix. Will recheck today.

## 2022-05-24 NOTE — Assessment & Plan Note (Signed)
Conts to be euvolemic. Remain off medications.

## 2022-05-24 NOTE — Progress Notes (Signed)
Subjective:     Morgan [name] is a 71 y.o. female presenting for Follow-up (Still having nausea even with the patch and zofran )     HPI  #Nausea - persistent - did get dizzy yesterday when standing - eating and drinking OK - taking scoplamine - ran out of reglan   #Low potassium - not taking lasix - did take to supplement - taking 2 tablets of potassium daily  #parkinson - saw neurology - is getting a biopsy test to help guide treatment - drives to and from the doctor's office -   Review of Systems  05/17/2022: [facility] - inhaler, N/V - sch GI f/u. Potassium low - take extra pill x 5 days 05/18/2022: Neuro - advised cards eval for syncope, testing for parkinson and increased dose of carbidopa-levodopa. Advised not driving. EEG normal. Migraines - avoid triptans  Social History   Tobacco Use  Smoking Status Former   Packs/day: 1.00   Years: 15.00   Total pack years: 15.00   Types: Cigarettes   Quit date: 09/12/2017   Years since quitting: 4.6  Smokeless Tobacco Never        Objective:    BP Readings from Last 3 Encounters:  05/24/22 124/80  05/17/22 100/60  05/15/22 130/70   Wt Readings from Last 3 Encounters:  05/24/22 203 lb 6 oz (92.3 kg)  05/17/22 207 lb 4 oz (94 kg)  05/15/22 206 lb 3.2 oz (93.5 kg)    BP 124/80   Pulse 82   Temp (!) 97 F (36.1 C) (Temporal)   Ht '6\' 1"'$  (1.854 m)   Wt 203 lb 6 oz (92.3 kg)   SpO2 96%   BMI 26.83 kg/m    Physical Exam Constitutional:      General: She is not in acute distress.    Appearance: She is well-developed. She is not diaphoretic.  HENT:     Right Ear: External ear normal.     Left Ear: External ear normal.     Nose: Nose normal.  Eyes:     Conjunctiva/sclera: Conjunctivae normal.  Cardiovascular:     Rate and Rhythm: Normal rate and regular rhythm.  Pulmonary:     Effort: Pulmonary effort is normal. No respiratory distress.     Breath sounds: Normal breath sounds. No wheezing.   Musculoskeletal:     Cervical back: Neck supple.  Skin:    General: Skin is warm and dry.     Capillary Refill: Capillary refill takes less than 2 seconds.  Neurological:     Mental Status: She is alert. Mental status is at baseline.  Psychiatric:        Mood and Affect: Mood normal.        Behavior: Behavior normal.           Assessment & Plan:   Problem List Items Addressed This Visit       Cardiovascular and Mediastinum   Chronic diastolic CHF (congestive heart failure) ([place])    Conts to be euvolemic. Remain off medications.         Digestive   Nausea and vomiting in adult - Primary    Ran out of reglan. Refill Reglan. Cont scoplamine. Prn zofran. Has GI appointment for when she returns from vacation. Appreciate support. Appears well hydrated      Relevant Medications   metoCLOPramide (REGLAN) 5 MG tablet     Nervous and Auditory   Parkinson's disease ([place])    Follows with  Dr Manuella Ghazi, appreciate support. Sinemet increased. Anticipating biopsies to evaluate further.         Other   Hypokalemia    Persistent symptoms. On potassium twice daily. No longer on lasix. Will recheck today.       Relevant Orders   Basic metabolic panel     Return in about 3 months (around 08/24/2022) for check-in.  [name], MD

## 2022-05-25 IMAGING — DX DG CHEST 1V PORT
1 series · 1 of 1 positions shown · non-contrast
Comparison: October 31, 2020

CLINICAL DATA: Abdominal pain.  Shortness of breath.

EXAM:
PORTABLE CHEST 1 VIEW

[chest]
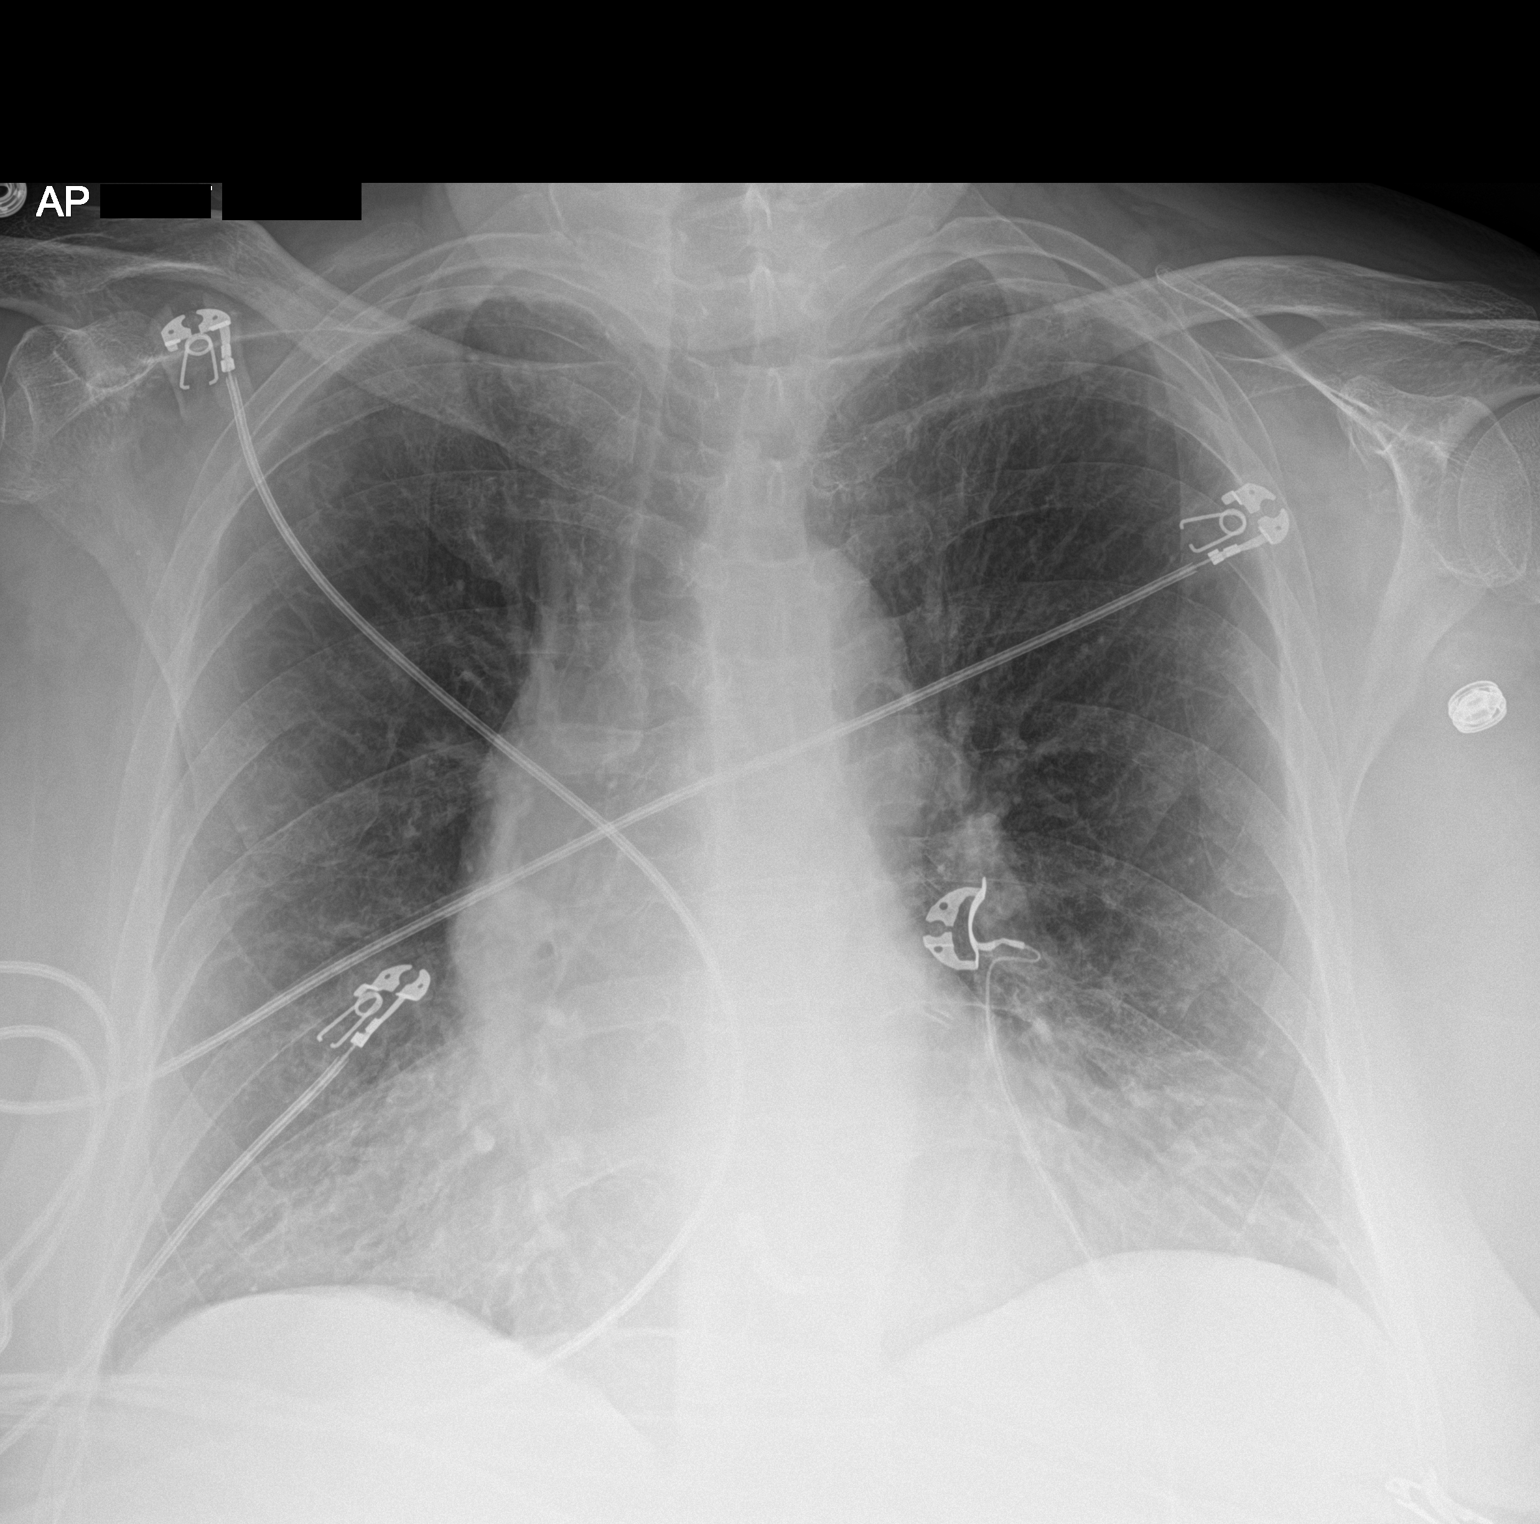

[1 of 1 positions shown; findings below may reference images not displayed]

FINDINGS: Haziness over the lung bases identified. The cardiomediastinal
silhouette is stable. No pneumothorax. No nodules or masses.
IMPRESSION: Haziness over the lung bases could represent overlapping soft
tissues or atelectasis. Developing bibasilar infiltrates considered
less likely. A PA and lateral x-ray could better evaluate as
clinically warranted.

## 2022-05-25 IMAGING — CT CT ABD-PELV W/O CM
2 of 4 series · 17 of 46 positions shown, 19 images · non-contrast
Comparison: CT dated October 26, 2020

CLINICAL DATA: Diverticulitis suspected.  Left abdominal pain

EXAM:
CT ABDOMEN AND PELVIS WITHOUT CONTRAST
TECHNIQUE: Multidetector CT imaging of the abdomen and pelvis was performed
following the standard protocol without IV contrast.

[Series 3: a/p w/o 5mm · axial · non-contrast · 0.98mm/px · z∈[+729,+1204]mm · 14 of 103 slices shown, 16 images]
[im 4/103  soft-tissue]
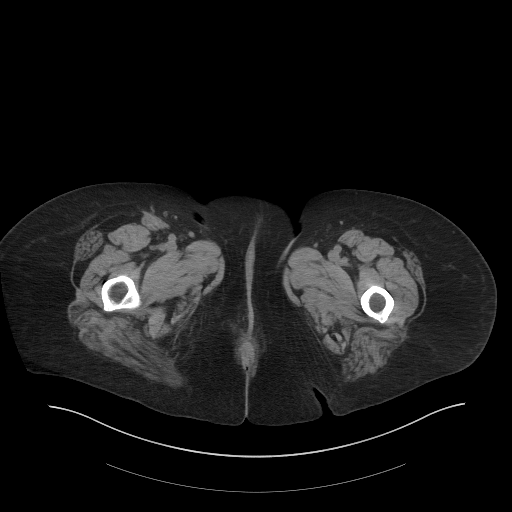
[im 4/103  bone]
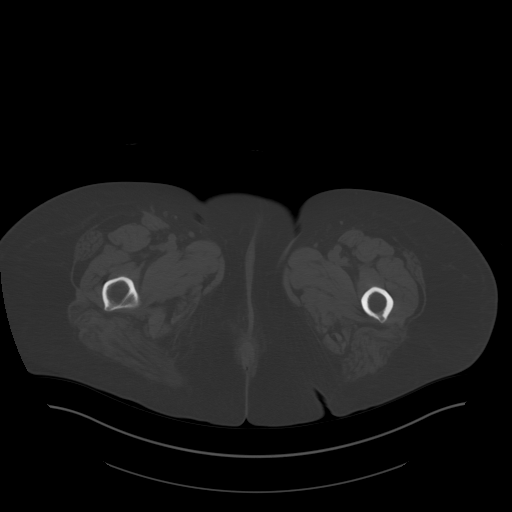
[im 12/103  soft-tissue]
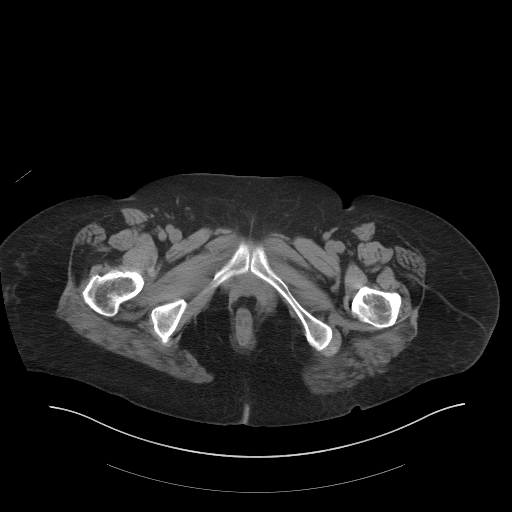
[im 20/103  soft-tissue]
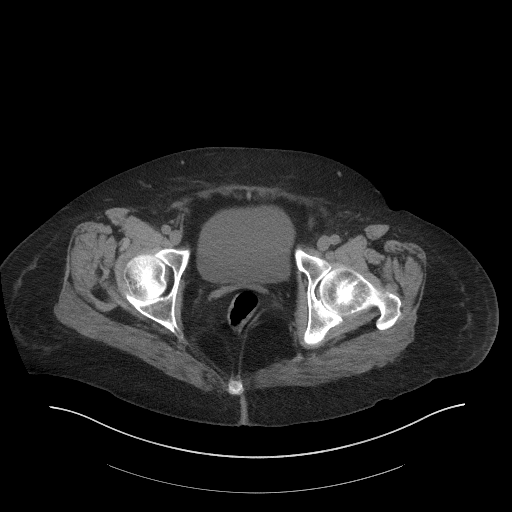
[im 28/103  soft-tissue]
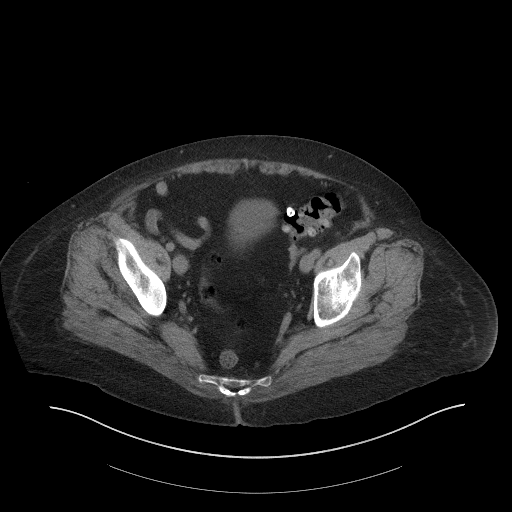
[im 36/103  soft-tissue]
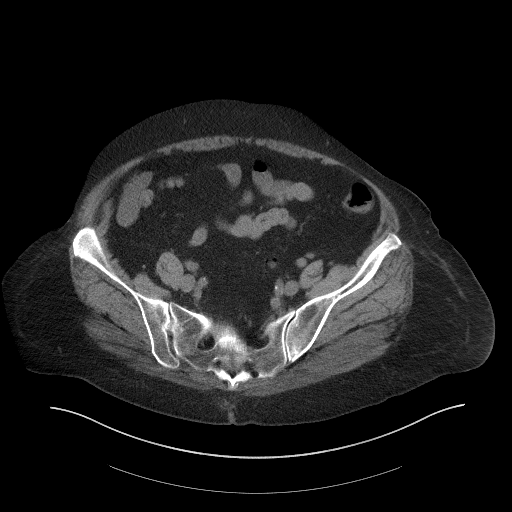
[im 40/103  soft-tissue]
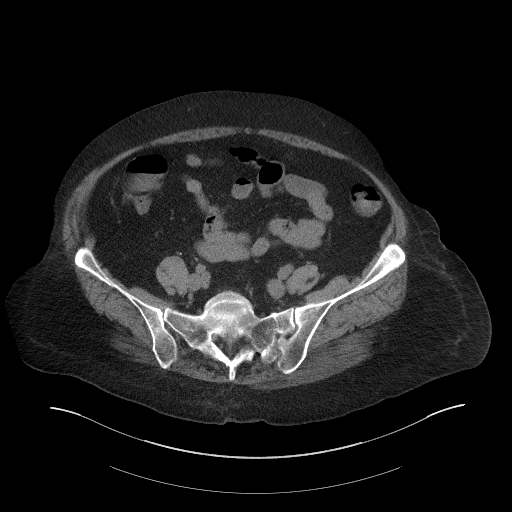
[im 48/103  soft-tissue]
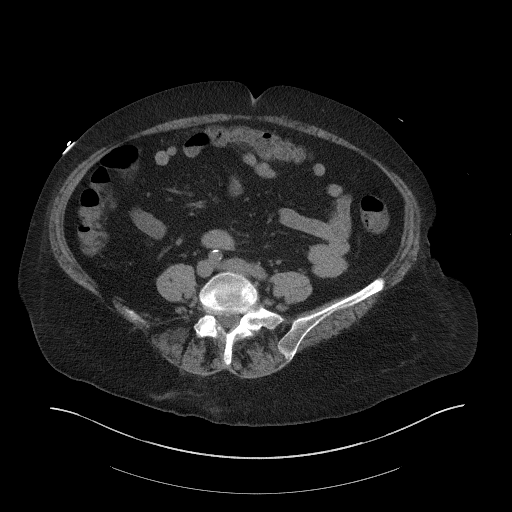
[im 55/103  soft-tissue]
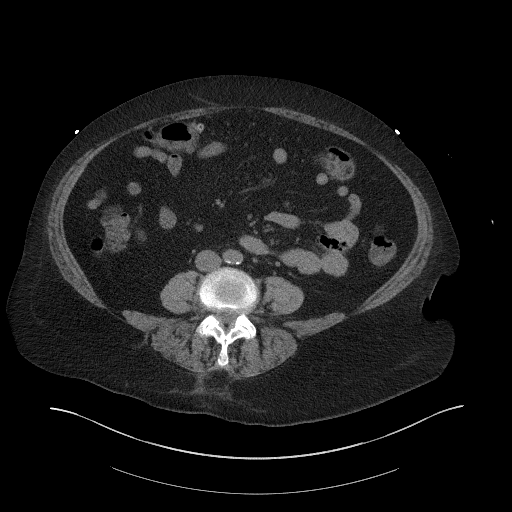
[im 63/103  soft-tissue]
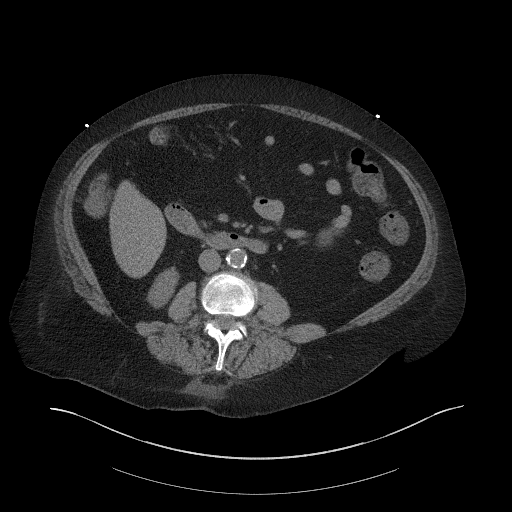
[im 63/103  bone]
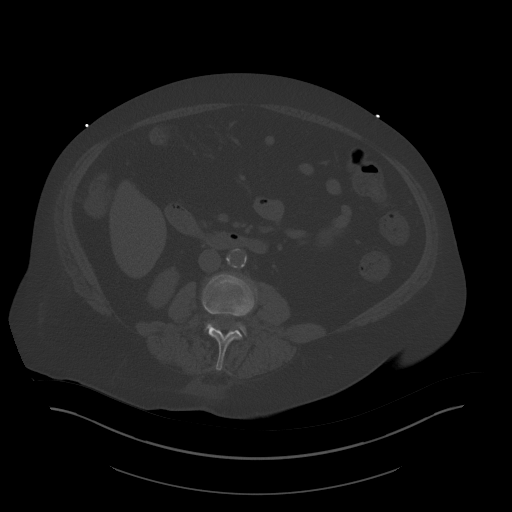
[im 67/103  soft-tissue]
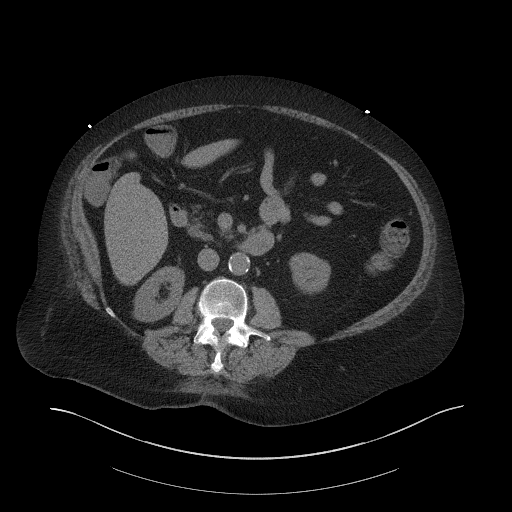
[im 75/103  soft-tissue]
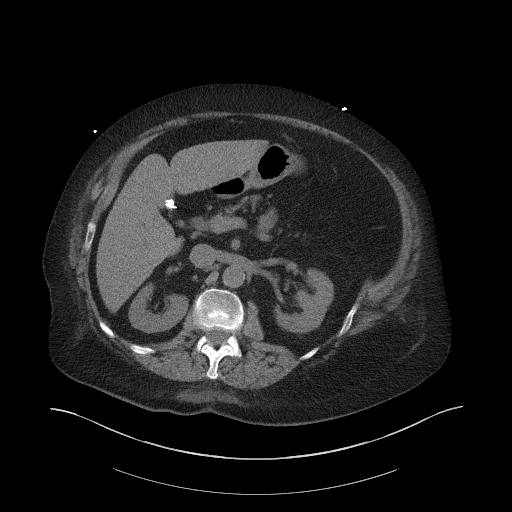
[im 83/103  soft-tissue]
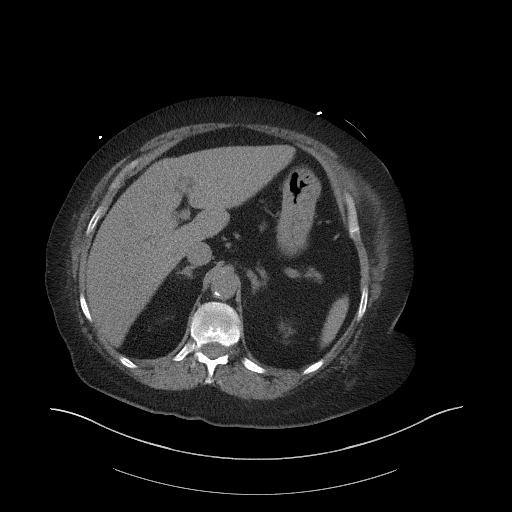
[im 91/103  soft-tissue]
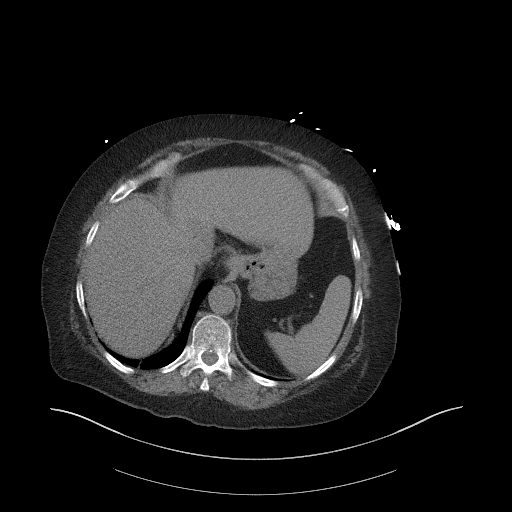
[im 99/103  soft-tissue]
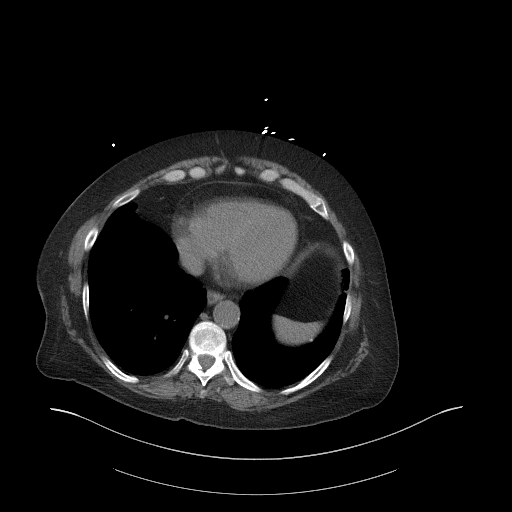

[Series 6: a/p w/o cor · coronal · non-contrast · 1.00mm/px · 3 of 180 slices shown]
[im 60/180  soft-tissue]
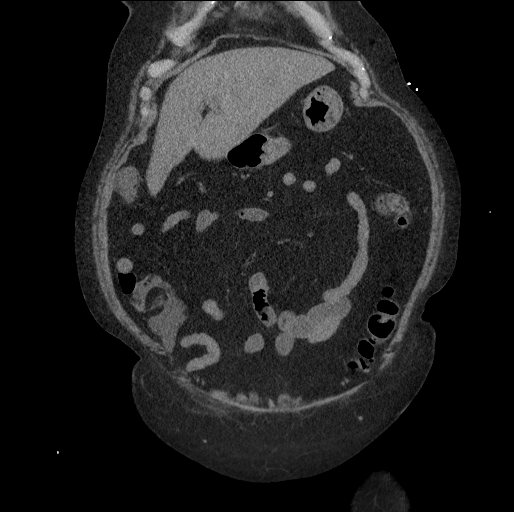
[im 80/180  soft-tissue]
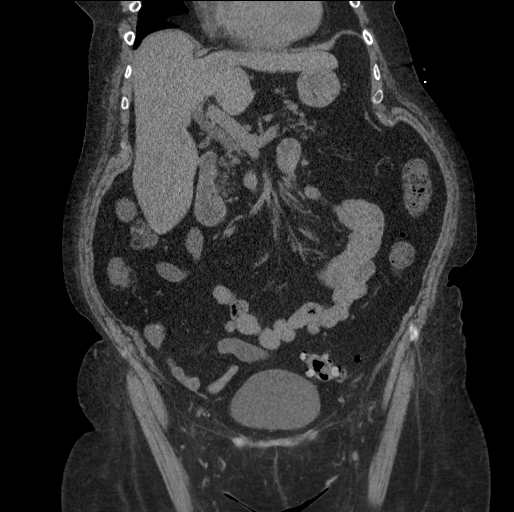
[im 100/180  soft-tissue]
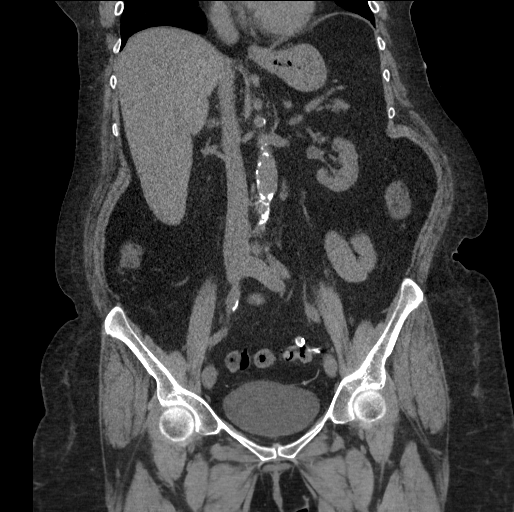

[17 of 46 positions shown; findings below may reference images not displayed]

FINDINGS: Lower chest: The lung bases are clear. The heart size is normal.

Hepatobiliary: The liver is normal. Status post
cholecystectomy.There is no biliary ductal dilation.

Pancreas: Normal contours without ductal dilatation. No
peripancreatic fluid collection.

Spleen: Unremarkable.

Adrenals/Urinary Tract:

--Adrenal glands: Unremarkable.

--Right kidney/ureter: No hydronephrosis or radiopaque kidney
stones.

--Left kidney/ureter: No hydronephrosis or radiopaque kidney stones.

--Urinary bladder: Unremarkable.

Stomach/Bowel:

--Stomach/Duodenum: No hiatal hernia or other gastric abnormality.
Normal duodenal course and caliber.

--Small bowel: Unremarkable.

--Colon: Rectosigmoid diverticulosis without acute inflammation.
Portions of the transverse colon are underdistended which limits
evaluation. There is no definite CT evidence for infectious or
inflammatory colitis.

--Appendix: Not visualized. No right lower quadrant inflammation or
free fluid.

Vascular/Lymphatic: Atherosclerotic calcification is present within
the non-aneurysmal abdominal aorta, without hemodynamically
significant stenosis.

--No retroperitoneal lymphadenopathy.

--No mesenteric lymphadenopathy.

--No pelvic or inguinal lymphadenopathy.

Reproductive: Status post hysterectomy. No adnexal mass.

Other: No ascites or free air. The abdominal wall is normal.

Musculoskeletal. No acute displaced fractures.
IMPRESSION: 1. No acute abdominopelvic abnormality.
2. Rectosigmoid diverticulosis without acute inflammation.

Aortic Atherosclerosis ([id number]).

## 2022-06-05 ENCOUNTER — Telehealth: Payer: Self-pay | Admitting: Family Medicine

## 2022-06-05 DIAGNOSIS — D631 Anemia in chronic kidney disease: Secondary | ICD-10-CM | POA: Diagnosis not present

## 2022-06-05 DIAGNOSIS — I5032 Chronic diastolic (congestive) heart failure: Secondary | ICD-10-CM | POA: Diagnosis not present

## 2022-06-05 DIAGNOSIS — E785 Hyperlipidemia, unspecified: Secondary | ICD-10-CM | POA: Diagnosis not present

## 2022-06-05 DIAGNOSIS — J449 Chronic obstructive pulmonary disease, unspecified: Secondary | ICD-10-CM | POA: Diagnosis not present

## 2022-06-05 DIAGNOSIS — F419 Anxiety disorder, unspecified: Secondary | ICD-10-CM | POA: Diagnosis not present

## 2022-06-05 DIAGNOSIS — J9611 Chronic respiratory failure with hypoxia: Secondary | ICD-10-CM | POA: Diagnosis not present

## 2022-06-05 DIAGNOSIS — G20 Parkinson's disease: Secondary | ICD-10-CM | POA: Diagnosis not present

## 2022-06-05 DIAGNOSIS — R69 Illness, unspecified: Secondary | ICD-10-CM | POA: Diagnosis not present

## 2022-06-05 DIAGNOSIS — G8194 Hemiplegia, unspecified affecting left nondominant side: Secondary | ICD-10-CM | POA: Diagnosis not present

## 2022-06-05 DIAGNOSIS — I130 Hypertensive heart and chronic kidney disease with heart failure and stage 1 through stage 4 chronic kidney disease, or unspecified chronic kidney disease: Secondary | ICD-10-CM | POA: Diagnosis not present

## 2022-06-05 DIAGNOSIS — Z8711 Personal history of peptic ulcer disease: Secondary | ICD-10-CM | POA: Diagnosis not present

## 2022-06-05 DIAGNOSIS — K219 Gastro-esophageal reflux disease without esophagitis: Secondary | ICD-10-CM | POA: Diagnosis not present

## 2022-06-05 DIAGNOSIS — G43909 Migraine, unspecified, not intractable, without status migrainosus: Secondary | ICD-10-CM | POA: Diagnosis not present

## 2022-06-05 DIAGNOSIS — Z9981 Dependence on supplemental oxygen: Secondary | ICD-10-CM | POA: Diagnosis not present

## 2022-06-05 DIAGNOSIS — N1831 Chronic kidney disease, stage 3a: Secondary | ICD-10-CM | POA: Diagnosis not present

## 2022-06-05 NOTE — Telephone Encounter (Signed)
Home Health verbal orders Caller Name: Jeanine Luz Agency Name: [facility] number: [phone] secure  Requesting OT/PT/Skilled nursing/Social Work/Speech: nursing, OT, PT  Frequency: nursing: 1 time a week for 5 weeks. Patient needs to be evaluated for OT and PT  Morgan [name] states that they are trying to start as soon as possible

## 2022-06-06 ENCOUNTER — Encounter (INDEPENDENT_AMBULATORY_CARE_PROVIDER_SITE_OTHER): Payer: Self-pay | Admitting: Nurse Practitioner

## 2022-06-06 ENCOUNTER — Ambulatory Visit (INDEPENDENT_AMBULATORY_CARE_PROVIDER_SITE_OTHER): Payer: Medicare HMO | Admitting: Nurse Practitioner

## 2022-06-06 ENCOUNTER — Ambulatory Visit (INDEPENDENT_AMBULATORY_CARE_PROVIDER_SITE_OTHER): Payer: Medicare HMO

## 2022-06-06 VITALS — BP 121/72 | HR 90 | Resp 17 | Ht 73.0 in | Wt 206.0 lb

## 2022-06-06 DIAGNOSIS — I739 Peripheral vascular disease, unspecified: Secondary | ICD-10-CM | POA: Diagnosis not present

## 2022-06-06 DIAGNOSIS — I6523 Occlusion and stenosis of bilateral carotid arteries: Secondary | ICD-10-CM

## 2022-06-06 NOTE — Telephone Encounter (Signed)
Left VM for Manuela Schwartz relaying verbal orders, per Dr. Einar Pheasant.

## 2022-06-06 NOTE — Telephone Encounter (Signed)
Agree with HH orders as requested 

## 2022-06-06 NOTE — Progress Notes (Signed)
Subjective:    Patient ID: Morgan [name], female    DOB: 06/25/1951, 71 y.o.   MRN: [id number] No chief complaint on file.   Morgan [name] is a 71 year old female that presents today for follow-up evaluation of carotid artery stenosis by Dr. Manuella Ghazi.  The patient was recently at [facility] emergency room and there was concern that she was having strokelike symptoms.  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  She is mostly resolved from this.  During that time the patient had a CT scan which showedPlaque at the right ICA origin causes approximately 60% stenosis. Plaque at the left ICA origin causes approximately 50% stenosis.  She also describes claudication-like symptoms when she ambulates.  She notes today that the migraines are much better.  Today the patient has a 40 to 59% stenosis in the right ICA with 1 to 39% stenosis of the left ICA although these are near the high end of the scale.  The bilateral vertebrals have antegrade flow with normal flow hemodynamics bilateral subclavian arteries.  Today noninvasive studies show a right ABI of 1.13 on the left that is noncompressible.  Patient has triphasic tibial artery waveforms bilaterally with normal toe waveforms bilaterally.      Review of Systems  Neurological:  Positive for headaches. Negative for facial asymmetry.  All other systems reviewed and are negative.      Objective:   Physical Exam Vitals reviewed.  HENT:     Head: Normocephalic.  Cardiovascular:     Rate and Rhythm: Normal rate.     Pulses: Normal pulses.  Pulmonary:     Effort: Pulmonary effort is normal.  Skin:    General: Skin is warm and dry.  Neurological:     Mental Status: She is alert and oriented to person, place, and time.  Psychiatric:        Mood and Affect: Mood normal.        Behavior: Behavior normal.        Thought Content: Thought content normal.        Judgment: Judgment normal.     BP 121/72 (BP Location:  Right Arm)   Pulse 90   Resp 17   Ht '6\' 1"'$  (1.854 m)   Wt 206 lb (93.4 kg)   BMI 27.18 kg/m   Past Medical History:  Diagnosis Date   Allergy    Anemia    Anxiety    Arthritis    CHF (congestive heart failure) (HCC)    Depression    GERD (gastroesophageal reflux disease)    Hypertension    MVP (mitral valve prolapse)    Parkinson disease (HCC)     Social History   Socioeconomic History   Marital status: Single    Spouse name: Not on file   Number of children: 2   Years of education: Not on file   Highest education level: Not on file  Occupational History   Not on file  Tobacco Use   Smoking status: Former    Packs/day: 1.00    Years: 15.00    Total pack years: 15.00    Types: Cigarettes    Quit date: 09/12/2017    Years since quitting: 4.7   Smokeless tobacco: Never  Vaping Use   Vaping Use: Never used  Substance and Sexual Activity   Alcohol use: Not Currently   Drug use: No   Sexual activity: Not Currently  Other Topics Concern   Not  on file  Social History Narrative   Lives with Grandson's great grandparents    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)   Daughter - [name] - lives in the [place] system - family, aunt, [name] and [name] (who she lives with), brother   Transportation: roommates   Enjoy: [employer], playing with grandson   Exercise: PT exercises   Diet: good - chicken, some steak, tries to eat healthy, avoids fried foods   Retired from being a Quarry manager   Social Determinants of Radio broadcast assistant Strain: [place]  (11/12/2018)   Overall Financial Resource Strain (CARDIA)    Difficulty of Paying Living Expenses: Not hard at all  Food Insecurity: Not on file  Transportation Needs: Not on file  Physical Activity: Not on file  Stress: Not on file  Social Connections: Not on file  Intimate Partner Violence: Not on file    Past Surgical History:  Procedure Laterality Date   [place] N/A 04/21/2020   Procedure: [place];  Surgeon: Milus Banister, MD;  Location: [facility];  Service: Endoscopy;  Laterality: N/A;  pyloric/ GI   BALLOON DILATION N/A 07/04/2020   Procedure: BALLOON DILATION;  Surgeon: Mauri Pole, MD;  Location: [facility];  Service: Endoscopy;  Laterality: N/A;   BIOPSY  04/21/2020   Procedure: BIOPSY;  Surgeon: Milus Banister, MD;  Location: [facility];  Service: Endoscopy;;   BIOPSY  07/04/2020   Procedure: BIOPSY;  Surgeon: Mauri Pole, MD;  Location: [facility];  Service: Endoscopy;;  EGD and COLON   BREAST SURGERY Right 1988   tumor removal   CHOLECYSTECTOMY N/A 05/18/2020   Procedure: LAPAROSCOPIC CHOLECYSTECTOMY;  Surgeon: Johnathan Hausen, MD;  Location: [facility];  Service: General;  Laterality: N/A;   COLONOSCOPY WITH PROPOFOL N/A 07/04/2020   Procedure: COLONOSCOPY WITH PROPOFOL;  Surgeon: Mauri Pole, MD;  Location: [facility];  Service: Endoscopy;  Laterality: N/A;   COLONOSCOPY WITH PROPOFOL N/A 11/03/2021   Procedure: COLONOSCOPY WITH PROPOFOL;  Surgeon: Lesly Rubenstein, MD;  Location: [facility] END[facility]COPY;  Service: Endoscopy;  Laterality: N/A;   ESOPHAGEAL DILATION  08/11/2020   Procedure: PYLORIC DILATION;  Surgeon: Doran Stabler, MD;  Location: [facility];  Service: Gastroenterology;;   ESOPHAGOGASTRODUODENOSCOPY (EGD) WITH PROPOFOL N/A 04/21/2020   Procedure: ESOPHAGOGASTRODUODENOSCOPY (EGD) WITH PROPOFOL;  Surgeon: Milus Banister, MD;  [facility]: [facility];  Service: Endoscopy;  Laterality: N/A;   ESOPHAGOGASTRODUODENOSCOPY (EGD) WITH PROPOFOL N/A 07/04/2020   Procedure: ESOPHAGOGASTRODUODENOSCOPY (EGD) WITH PROPOFOL;  Surgeon: Mauri Pole, MD;  [facility]: [facility];  Service: Endoscopy;  Laterality: N/A;   ESOPHAGOGASTRODUODENOSCOPY (EGD) WITH PROPOFOL N/A 08/11/2020   Procedure: ESOPHAGOGASTRODUODENOSCOPY (EGD) WITH PROPOFOL;  Surgeon: Doran Stabler, MD;  [facility]: [facility];  Service: Gastroenterology;  Laterality: N/A;   ESOPHAGOGASTRODUODENOSCOPY (EGD) WITH PROPOFOL N/A 11/03/2021   Procedure: ESOPHAGOGASTRODUODENOSCOPY (EGD) WITH PROPOFOL;  Surgeon: Lesly Rubenstein, MD;  Location: [facility] END[facility]COPY;  Service: Endoscopy;  Laterality: N/A;   ESOPHAGOGASTRODUODENOSCOPY (EGD) WITH PROPOFOL N/A 05/07/2022   Procedure: ESOPHAGOGASTRODUODENOSCOPY (EGD) WITH PROPOFOL;  Surgeon: Lin Landsman, MD;  Location: [facility] ENDOSCOPY;  Service: Gastroenterology;  Laterality: N/A;   KNEE SURGERY  2005   2 times left   KNEE SURGERY Right    KYPHOPLASTY N/A 07/20/2021   Procedure: T 10KYPHOPLASTY;  Surgeon: Hessie Knows, MD;  Location: [facility] ORS;  Service: Orthopedics;  Laterality: N/A;  KYPHOPLASTY N/A 08/23/2021   Procedure: T9 KYPHOPLASTY;  Surgeon: Hessie Knows, MD;  Location: [facility] ORS;  Service: Orthopedics;  Laterality: N/A;   POLYPECTOMY  07/04/2020   Procedure: POLYPECTOMY;  Surgeon: Mauri Pole, MD;  Location: [facility];  Service: Endoscopy;;   TUBAL LIGATION      Family History  Problem Relation Age of Onset   Breast cancer Mother 48   Ovarian cancer Mother 22   Brain cancer Mother    Hypertension Father    Hyperlipidemia Father    Heart disease Father    Colon cancer Father        diagnosed in his 31s   Breast cancer Maternal Grandmother 94   Diabetes Paternal Grandmother    Breast cancer Paternal Grandmother 81   Other Son        drug over dose   Colon cancer Maternal Grandfather        dx in his 66s   Liver disease Maternal Grandfather    Breast cancer Maternal Aunt 60   Breast cancer Maternal Aunt 60   Colon cancer Maternal Uncle    Stomach cancer Neg Hx    Pancreatic cancer Neg Hx    Esophageal cancer Neg Hx     Allergies  Allergen Reactions   Meperidine Hives and Anaphylaxis   Strawberry Extract Hives and Swelling   Sucralfate Nausea Only and Other (See Comments)    Severe nausea Other reaction(s): Other (See  Comments) Severe nausea Severe nausea   Wasp Venom Hives       Latest Ref Rng & Units 05/17/2022    8:32 AM 05/09/2022    6:06 AM 05/08/2022    4:26 AM  CBC  WBC 4.0 - 10.5 K/uL 8.4  5.6  5.8   Hemoglobin 12.0 - 15.0 g/dL 10.1  8.4  8.7   Hematocrit 36.0 - 46.0 % 32.2  26.8  27.8   Platelets 150.0 - 400.0 K/uL 267.0  238  251       CMP     Component Value Date/Time   NA 138 05/24/2022 0910   NA 143 12/26/2018 0000   NA 139 09/18/2012 2000   K 3.2 (L) 05/24/2022 0910   K 3.3 (L) 09/18/2012 2000   CL 98 05/24/2022 0910   CL 99 09/18/2012 2000   CO2 30 05/24/2022 0910   CO2 33 (H) 09/18/2012 2000   GLUCOSE 94 05/24/2022 0910   GLUCOSE 111 (H) 09/18/2012 2000   BUN 7 05/24/2022 0910   BUN 16 12/26/2018 0000   BUN 13 09/18/2012 2000   CREATININE 0.90 05/24/2022 0910   CREATININE 0.72 09/18/2012 2000   CALCIUM 8.4 05/24/2022 0910   CALCIUM 9.4 09/18/2012 2000   PROT 6.8 05/04/2022 1345   PROT 7.3 09/18/2012 2000   ALBUMIN 3.1 (L) 05/04/2022 1345   ALBUMIN 2.8 (L) 09/18/2012 2000   AST 18 05/04/2022 1345   AST 19 09/18/2012 2000   ALT 9 05/04/2022 1345   ALT 10 (L) 09/18/2012 2000   ALKPHOS 120 05/04/2022 1345   ALKPHOS 163 (H) 09/18/2012 2000   BILITOT 0.9 05/04/2022 1345   BILITOT 0.5 09/18/2012 2000   GFRNONAA >60 05/09/2022 0606   GFRNONAA >60 09/18/2012 2000   GFRAA >60 09/02/2020 1658   GFRAA >60 09/18/2012 2000     No results found.     Assessment & Plan:   1. Bilateral carotid artery stenosis Carotid duplex today remains consistent with previous CT results.  Typically do not intervene  on carotid stenosis until about 75 or 80%.  Given that this is not a recent stroke, we will continue to monitor with the patient.  We will have her return in 6 months for carotid duplex.  2. Claudication [facility]) Recommend:  The patient has atypical pain symptoms for vascular disease and on exam I do not find evidence of vascular pathology that would explain the patient's  symptoms.  Noninvasive studies do not identify significant vascular problems  I suspect the patient is c/o pseudoclaudication.  Patient should have an evaluation of the LS spine which I defer to the primary service or the Spine service.  The patient should continue walking and begin a more formal exercise program. The patient should continue his antiplatelet therapy and aggressive treatment of the lipid abnormalities.   Current Outpatient Medications on File Prior to Visit  Medication Sig Dispense Refill   acetaminophen (TYLENOL) 325 MG tablet Take 2 tablets (650 mg total) by mouth every 4 (four) hours as needed for mild pain (or temp > 37.5 C (99.5 F)).     AIMOVIG 140 MG/ML SOAJ SMARTSIG:140 Milligram(s) SUB-Q Once a Month     aspirin-acetaminophen-caffeine (EXCEDRIN MIGRAINE) 250-250-65 MG tablet Take 1-2 tablets by mouth every 6 (six) hours as needed for headache.     Budeson-Glycopyrrol-Formoterol (BREZTRI AEROSPHERE) 160-9-4.8 MCG/ACT AERO Inhale 2 puffs into the lungs in the morning and at bedtime. 10.7 g 3   carbidopa-levodopa (SINEMET IR) 25-100 MG tablet Take 2 tablets by mouth 3 (three) times daily.     Cholecalciferol 1.25 MG (50000 UT) TABS Take 1 tablet by mouth once a week. 4 tablet 1   fluticasone (FLONASE) 50 MCG/ACT nasal spray Place 1 spray into both nostrils daily.  2   KLOR-CON M20 20 MEQ tablet Take 20 mEq by mouth daily.     metoCLOPramide (REGLAN) 5 MG tablet Take 1 tablet (5 mg total) by mouth 4 (four) times daily -  before meals and at bedtime. 120 tablet 0   mirtazapine (REMERON) 15 MG tablet Take 15 mg by mouth at bedtime.     OXYGEN Inhale 3 L/min into the lungs See admin instructions. 3 L/min at bedtime and as needed for shortness of breath throughout the day     pantoprazole (PROTONIX) 40 MG tablet Take 1 tablet (40 mg total) by mouth 2 (two) times daily before a meal. 60 tablet 0   scopolamine (TRANSDERM-SCOP) 1 MG/3DAYS Place 1 patch (1.5 mg total) onto the skin  every 3 (three) days. 10 patch 0   atorvastatin (LIPITOR) 40 MG tablet TAKE 1 TABLET BY MOUTH EVERY DAY (Patient not taking: Reported on 06/06/2022) 90 tablet 1   diphenhydrAMINE (BENADRYL) 50 MG/ML injection Inject 0.25 mLs (12.5 mg total) into the vein every 8 (eight) hours as needed for up to 5 days (nausea vomiting). 3.75 mL 0   montelukast (SINGULAIR) 10 MG tablet Take 1 tablet (10 mg total) by mouth at bedtime. (Patient not taking: Reported on 06/06/2022) 30 tablet 11   sucralfate (CARAFATE) 1 GM/10ML suspension Take 10 mLs (1 g total) by mouth 2 (two) times daily. (Patient not taking: Reported on 06/06/2022) 600 mL 0   No current facility-administered medications on file prior to visit.    There are no Patient Instructions on file for this visit. No follow-ups on file.   Kris Hartmann, NP

## 2022-06-09 IMAGING — CR DG CHEST 2V
2 series · 2 of 2 positions shown · non-contrast
Comparison: February 05, 2021

CLINICAL DATA: Centralized chest pain with shortness of breath.

EXAM:
CHEST - 2 VIEW

[chest pa]
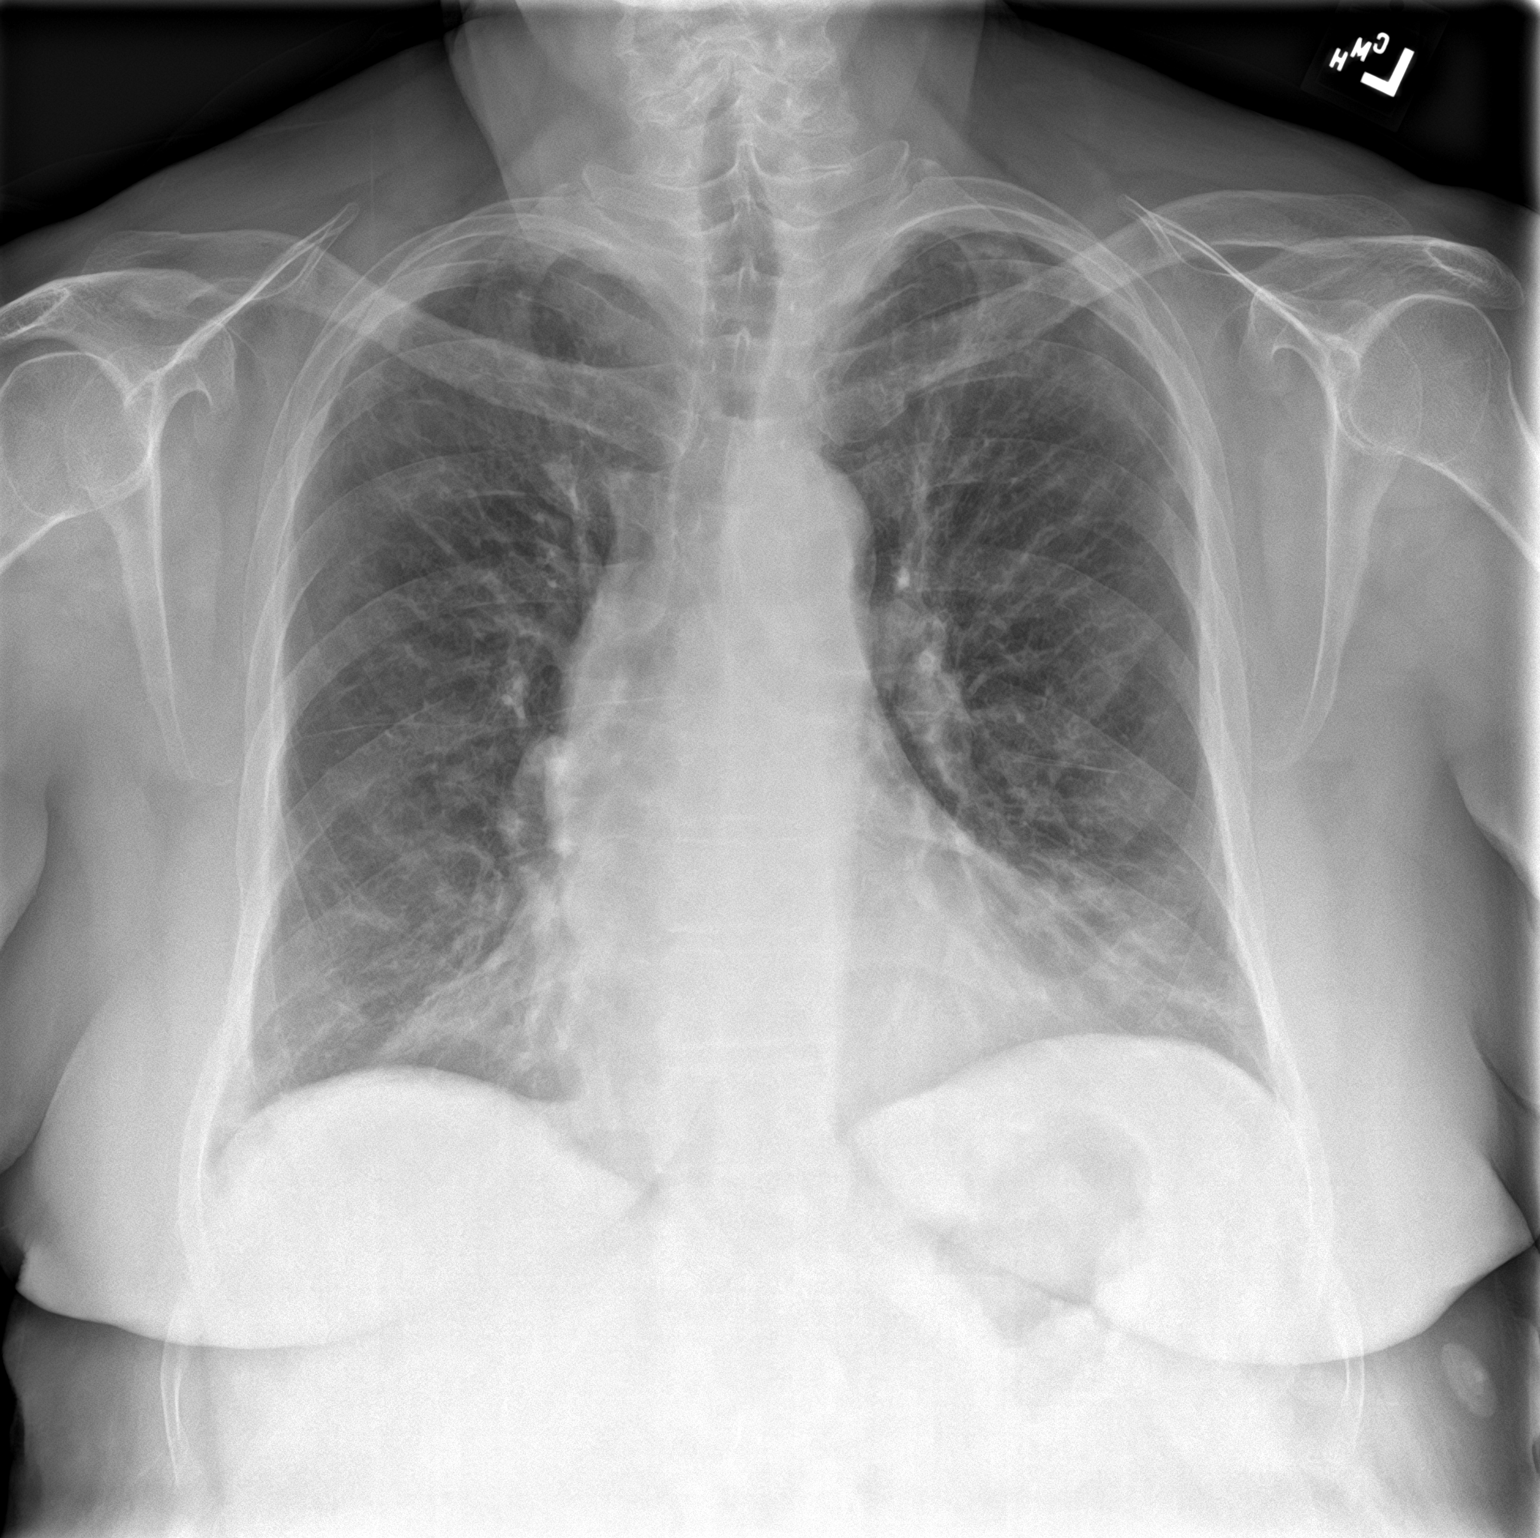

[chest lat]
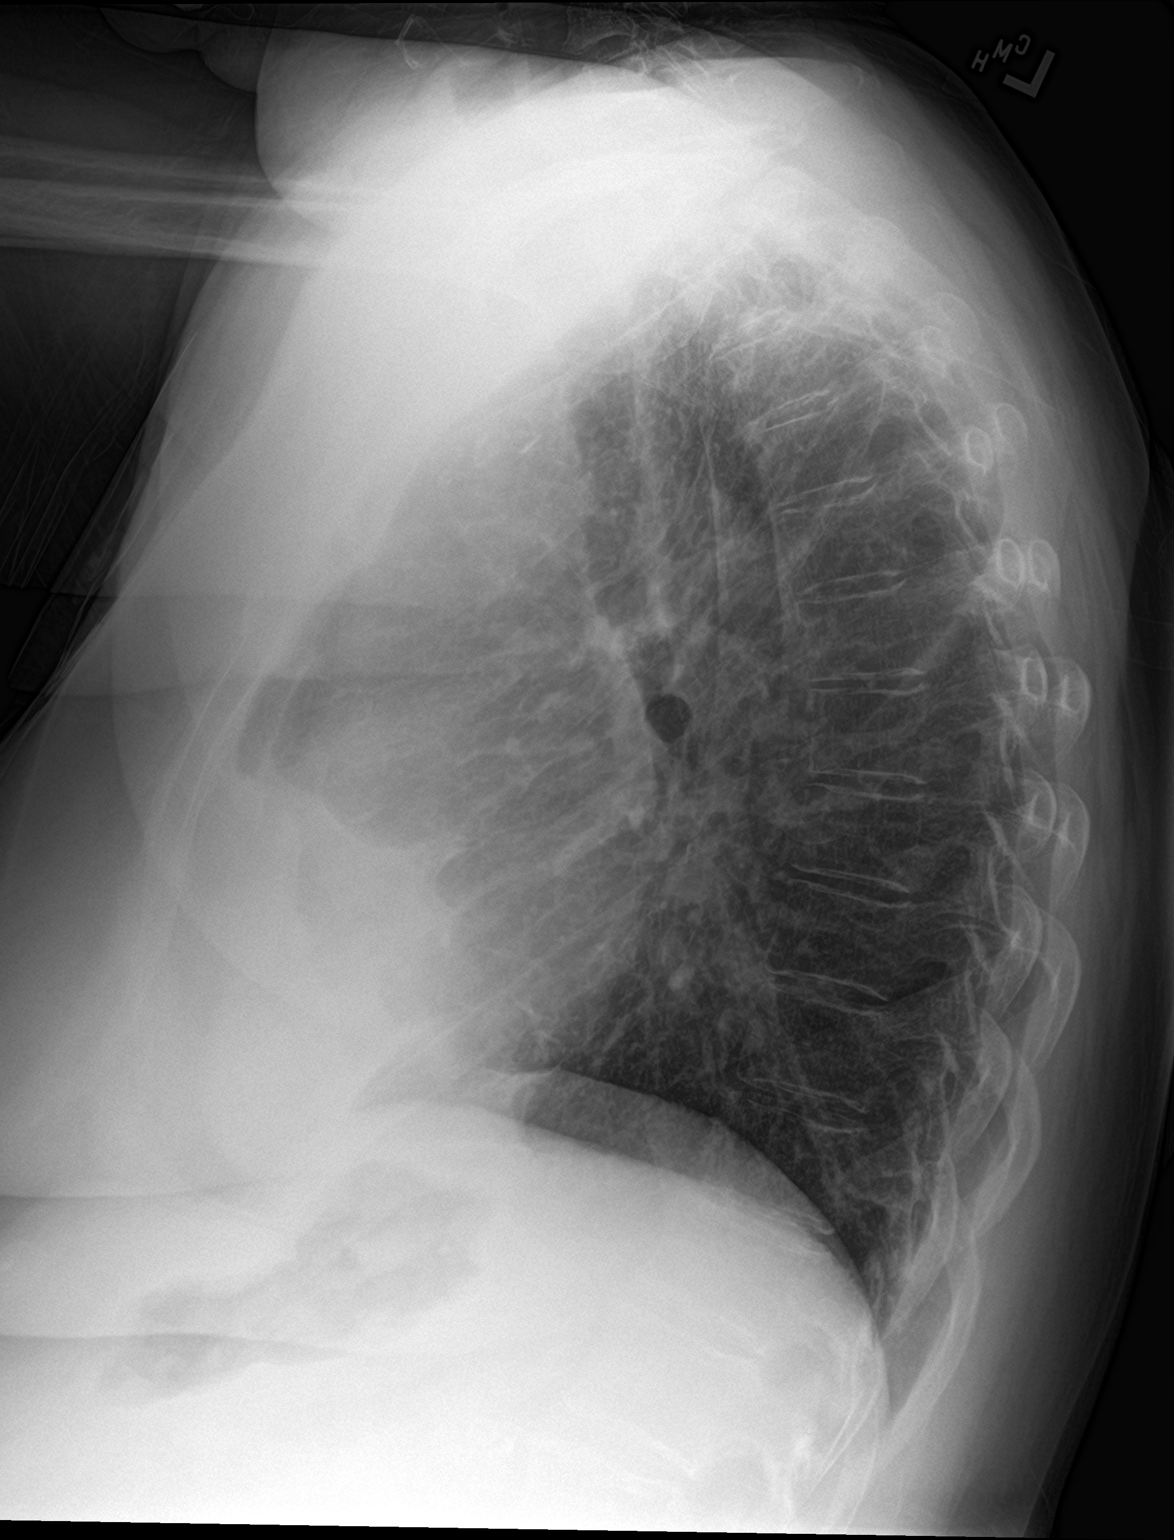

[2 of 2 positions shown; findings below may reference images not displayed]

FINDINGS: Mild diffusely increased interstitial lung markings are seen. Mild
areas of atelectasis and/or infiltrate are also noted within the
bilateral lung bases. This is stable in appearance when compared to
the prior study. There is no evidence of a pleural effusion or
pneumothorax. The cardiac silhouette is borderline in size. The
visualized skeletal structures are unremarkable.
IMPRESSION: Mild bibasilar atelectasis and/or infiltrate.

## 2022-06-11 ENCOUNTER — Encounter: Payer: Self-pay | Admitting: Oncology

## 2022-06-11 ENCOUNTER — Other Ambulatory Visit: Payer: Self-pay

## 2022-06-11 ENCOUNTER — Inpatient Hospital Stay: Payer: Medicare HMO | Attending: Oncology

## 2022-06-11 ENCOUNTER — Inpatient Hospital Stay: Payer: Medicare HMO | Admitting: Oncology

## 2022-06-11 ENCOUNTER — Inpatient Hospital Stay: Payer: Medicare HMO

## 2022-06-11 VITALS — BP 144/84 | HR 82 | Temp 98.3°F | Ht 73.0 in | Wt 207.0 lb

## 2022-06-11 DIAGNOSIS — I130 Hypertensive heart and chronic kidney disease with heart failure and stage 1 through stage 4 chronic kidney disease, or unspecified chronic kidney disease: Secondary | ICD-10-CM | POA: Insufficient documentation

## 2022-06-11 DIAGNOSIS — Z87891 Personal history of nicotine dependence: Secondary | ICD-10-CM | POA: Insufficient documentation

## 2022-06-11 DIAGNOSIS — Z803 Family history of malignant neoplasm of breast: Secondary | ICD-10-CM | POA: Diagnosis not present

## 2022-06-11 DIAGNOSIS — D509 Iron deficiency anemia, unspecified: Secondary | ICD-10-CM | POA: Insufficient documentation

## 2022-06-11 DIAGNOSIS — Z8041 Family history of malignant neoplasm of ovary: Secondary | ICD-10-CM | POA: Insufficient documentation

## 2022-06-11 DIAGNOSIS — N1831 Chronic kidney disease, stage 3a: Secondary | ICD-10-CM

## 2022-06-11 DIAGNOSIS — Z90710 Acquired absence of both cervix and uterus: Secondary | ICD-10-CM | POA: Diagnosis not present

## 2022-06-11 DIAGNOSIS — Z800 Family history of malignant neoplasm of digestive organs: Secondary | ICD-10-CM | POA: Insufficient documentation

## 2022-06-11 DIAGNOSIS — R748 Abnormal levels of other serum enzymes: Secondary | ICD-10-CM | POA: Diagnosis not present

## 2022-06-11 DIAGNOSIS — D508 Other iron deficiency anemias: Secondary | ICD-10-CM | POA: Diagnosis not present

## 2022-06-11 DIAGNOSIS — I509 Heart failure, unspecified: Secondary | ICD-10-CM | POA: Diagnosis not present

## 2022-06-11 LAB — RETIC PANEL
Immature Retic Fract: 10.4 % (ref 2.3–15.9)
RBC.: 3.97 MIL/uL (ref 3.87–5.11)
Retic Count, Absolute: 92.5 10*3/uL (ref 19.0–186.0)
Retic Ct Pct: 2.3 % (ref 0.4–3.1)
Reticulocyte Hemoglobin: 29.9 pg (ref >27.9)

## 2022-06-11 LAB — COMPREHENSIVE METABOLIC PANEL
ALT: <5 U/L (ref 0–44)
AST: 19 U/L (ref 15–41)
Albumin: 2.6 g/dL — ABNORMAL LOW (ref 3.5–5.0)
Alkaline Phosphatase: 134 U/L — ABNORMAL HIGH (ref 38–126)
Anion gap: 6 (ref 5–15)
BUN: 11 mg/dL (ref 8–23)
CO2: 29 mmol/L (ref 22–32)
Calcium: 8.2 mg/dL — ABNORMAL LOW (ref 8.9–10.3)
Chloride: 104 mmol/L (ref 98–111)
Creatinine, Ser: 0.92 mg/dL (ref 0.44–1.00)
GFR, Estimated: >60 mL/min (ref >60)
Glucose, Bld: 108 mg/dL — ABNORMAL HIGH (ref 70–99)
Potassium: 3.6 mmol/L (ref 3.5–5.1)
Sodium: 139 mmol/L (ref 135–145)
Total Bilirubin: 0.5 mg/dL (ref 0.3–1.2)
Total Protein: 5.9 g/dL — ABNORMAL LOW (ref 6.5–8.1)

## 2022-06-11 LAB — CBC WITH DIFFERENTIAL/PLATELET
Abs Immature Granulocytes: 0.03 10*3/uL (ref 0.00–0.07)
Basophils Absolute: 0 10*3/uL (ref 0.0–0.1)
Basophils Relative: 0 %
Eosinophils Absolute: 0.2 10*3/uL (ref 0.0–0.5)
Eosinophils Relative: 2 %
HCT: 31.9 % — ABNORMAL LOW (ref 36.0–46.0)
Hemoglobin: 10.1 g/dL — ABNORMAL LOW (ref 12.0–15.0)
Immature Granulocytes: 0 %
Lymphocytes Relative: 24 %
Lymphs Abs: 2.3 10*3/uL (ref 0.7–4.0)
MCH: 25.4 pg — ABNORMAL LOW (ref 26.0–34.0)
MCHC: 31.7 g/dL (ref 30.0–36.0)
MCV: 80.4 fL (ref 80.0–100.0)
Monocytes Absolute: 0.4 10*3/uL (ref 0.1–1.0)
Monocytes Relative: 4 %
Neutro Abs: 6.5 10*3/uL (ref 1.7–7.7)
Neutrophils Relative %: 70 %
Platelets: 371 10*3/uL (ref 150–400)
RBC: 3.97 MIL/uL (ref 3.87–5.11)
RDW: 18.9 % — ABNORMAL HIGH (ref 11.5–15.5)
WBC: 9.4 10*3/uL (ref 4.0–10.5)
nRBC: 0 % (ref 0.0–0.2)

## 2022-06-11 LAB — TECHNOLOGIST SMEAR REVIEW
Plt Morphology: NORMAL
RBC MORPHOLOGY: NORMAL
WBC MORPHOLOGY: NORMAL

## 2022-06-11 LAB — IRON AND TIBC
Iron: 38 ug/dL (ref 28–170)
Saturation Ratios: 21 % (ref 10.4–31.8)
TIBC: 178 ug/dL — ABNORMAL LOW (ref 250–450)
UIBC: 140 ug/dL

## 2022-06-11 LAB — FERRITIN: Ferritin: 159 ng/mL (ref 11–307)

## 2022-06-11 LAB — GAMMA GT: GGT: 30 U/L (ref 7–50)

## 2022-06-12 ENCOUNTER — Telehealth: Payer: Self-pay | Admitting: Family Medicine

## 2022-06-12 ENCOUNTER — Encounter: Payer: Self-pay | Admitting: Oncology

## 2022-06-12 DIAGNOSIS — D649 Anemia, unspecified: Secondary | ICD-10-CM | POA: Insufficient documentation

## 2022-06-15 ENCOUNTER — Telehealth: Payer: Self-pay | Admitting: Family Medicine

## 2022-06-15 NOTE — Telephone Encounter (Signed)
Noted  

## 2022-06-15 NOTE — Telephone Encounter (Signed)
Morgan [name] called from [facility] and she said they did the home health eval PT and patient is just going to keep doing her exercises and not do PT right now

## 2022-06-19 ENCOUNTER — Other Ambulatory Visit: Payer: Self-pay | Admitting: Family Medicine

## 2022-06-19 DIAGNOSIS — E876 Hypokalemia: Secondary | ICD-10-CM

## 2022-06-20 ENCOUNTER — Ambulatory Visit: Payer: Medicare Other

## 2022-06-21 ENCOUNTER — Telehealth: Payer: Self-pay

## 2022-06-21 ENCOUNTER — Ambulatory Visit: Payer: Medicare Other

## 2022-06-21 NOTE — Telephone Encounter (Signed)
Unsuccessful attempt to reach patient on preferred number listed in notes for scheduled AWV. Left message on voicemail okay to reschedule. 

## 2022-06-22 NOTE — Telephone Encounter (Signed)
Err

## 2022-06-26 ENCOUNTER — Telehealth: Payer: Self-pay

## 2022-06-26 MED ORDER — SCOPOLAMINE 1 MG/3DAYS TD PT72: 1.0000 | MEDICATED_PATCH | TRANSDERMAL | 0 refills | Status: DC

## 2022-06-26 NOTE — Telephone Encounter (Signed)
MEDICATION:scopolamine (TRANSDERM-SCOP) 1 MG/3DAYS  PHARMACY: [id number] [place], [place] - [address]  Comments: Patient states pharmacy has been trying to contact [place]. SAmiliahis on her last patch and is due for a change today.   **Let patient know to contact pharmacy at the end of the day to make sure medication is ready. **  ** Please notify patient to allow 48-72 hours to process**  **Encourage patient to contact the pharmacy for refills or [facility]*

## 2022-06-26 NOTE — Telephone Encounter (Signed)
Refills sent to pharmacy. 

## 2022-06-26 NOTE — Telephone Encounter (Signed)
Last filled by ER Dr. 05/10/22 #10 w/ 0  Last OV: 05/24/22  No future appts scheduled.

## 2022-06-27 ENCOUNTER — Encounter: Payer: Self-pay | Admitting: Oncology

## 2022-06-27 DIAGNOSIS — G20 Parkinson's disease: Secondary | ICD-10-CM | POA: Diagnosis not present

## 2022-06-27 DIAGNOSIS — I10 Essential (primary) hypertension: Secondary | ICD-10-CM | POA: Diagnosis not present

## 2022-06-27 DIAGNOSIS — G43409 Hemiplegic migraine, not intractable, without status migrainosus: Secondary | ICD-10-CM | POA: Diagnosis not present

## 2022-06-27 DIAGNOSIS — R69 Illness, unspecified: Secondary | ICD-10-CM | POA: Diagnosis not present

## 2022-06-27 DIAGNOSIS — G8929 Other chronic pain: Secondary | ICD-10-CM | POA: Diagnosis not present

## 2022-06-27 DIAGNOSIS — I509 Heart failure, unspecified: Secondary | ICD-10-CM | POA: Diagnosis not present

## 2022-06-27 DIAGNOSIS — R609 Edema, unspecified: Secondary | ICD-10-CM | POA: Diagnosis not present

## 2022-06-27 DIAGNOSIS — N1832 Chronic kidney disease, stage 3b: Secondary | ICD-10-CM | POA: Diagnosis not present

## 2022-06-27 DIAGNOSIS — K219 Gastro-esophageal reflux disease without esophagitis: Secondary | ICD-10-CM | POA: Diagnosis not present

## 2022-06-27 DIAGNOSIS — D649 Anemia, unspecified: Secondary | ICD-10-CM | POA: Diagnosis not present

## 2022-06-27 DIAGNOSIS — E785 Hyperlipidemia, unspecified: Secondary | ICD-10-CM | POA: Diagnosis not present

## 2022-06-27 DIAGNOSIS — M549 Dorsalgia, unspecified: Secondary | ICD-10-CM | POA: Diagnosis not present

## 2022-07-03 DIAGNOSIS — I6523 Occlusion and stenosis of bilateral carotid arteries: Secondary | ICD-10-CM | POA: Diagnosis not present

## 2022-07-03 DIAGNOSIS — G43419 Hemiplegic migraine, intractable, without status migrainosus: Secondary | ICD-10-CM | POA: Diagnosis not present

## 2022-07-03 DIAGNOSIS — R569 Unspecified convulsions: Secondary | ICD-10-CM | POA: Diagnosis not present

## 2022-07-03 DIAGNOSIS — R55 Syncope and collapse: Secondary | ICD-10-CM | POA: Diagnosis not present

## 2022-07-03 DIAGNOSIS — M5481 Occipital neuralgia: Secondary | ICD-10-CM | POA: Diagnosis not present

## 2022-07-03 DIAGNOSIS — R69 Illness, unspecified: Secondary | ICD-10-CM | POA: Diagnosis not present

## 2022-07-03 DIAGNOSIS — Z8673 Personal history of transient ischemic attack (TIA), and cerebral infarction without residual deficits: Secondary | ICD-10-CM | POA: Diagnosis not present

## 2022-07-03 DIAGNOSIS — G8194 Hemiplegia, unspecified affecting left nondominant side: Secondary | ICD-10-CM | POA: Diagnosis not present

## 2022-07-03 DIAGNOSIS — G20 Parkinson's disease: Secondary | ICD-10-CM | POA: Diagnosis not present

## 2022-07-07 ENCOUNTER — Other Ambulatory Visit: Payer: Self-pay | Admitting: Family Medicine

## 2022-07-07 DIAGNOSIS — K279 Peptic ulcer, site unspecified, unspecified as acute or chronic, without hemorrhage or perforation: Secondary | ICD-10-CM

## 2022-07-10 ENCOUNTER — Telehealth: Payer: Self-pay | Admitting: Family Medicine

## 2022-07-10 NOTE — Telephone Encounter (Signed)
Patient came in today stating she is donating her body and needs Dr. Einar Pheasant to look over the paperwork. Paper work has been placed in her box. Karron stated to give her call when its ready. Thank you!

## 2022-07-11 ENCOUNTER — Telehealth: Payer: Self-pay | Admitting: Family Medicine

## 2022-07-11 NOTE — Telephone Encounter (Signed)
Notified pt that paperwork could be picked up. Pt coming in the morning for appt.

## 2022-07-11 NOTE — Telephone Encounter (Signed)
I've reviewed the paperwork  Medical provider permission or signature is not needed.   I do think it is great she is considering donating her body for research and education.

## 2022-07-11 NOTE — Telephone Encounter (Signed)
Patient called and said she saw her Home Health Nurse yesterday afternoon around 5:30 and she said she thinks patient has fluid on lungs and wants her to have an xray, is this okay to schedule or does patient need an appt first. Please advise. Call back is [phone]

## 2022-07-11 NOTE — Telephone Encounter (Signed)
Vm left for pt to return my call.

## 2022-07-12 ENCOUNTER — Encounter: Payer: Self-pay | Admitting: Family

## 2022-07-12 ENCOUNTER — Encounter: Payer: Self-pay | Admitting: Oncology

## 2022-07-12 ENCOUNTER — Ambulatory Visit (INDEPENDENT_AMBULATORY_CARE_PROVIDER_SITE_OTHER)
Admission: RE | Admit: 2022-07-12 | Discharge: 2022-07-12 | Disposition: A | Discharge status: Home / Self Care | Payer: Medicare HMO | Source: Ambulatory Visit | Attending: Family | Admitting: Family

## 2022-07-12 ENCOUNTER — Ambulatory Visit (INDEPENDENT_AMBULATORY_CARE_PROVIDER_SITE_OTHER): Payer: Medicare HMO | Admitting: Family

## 2022-07-12 VITALS — BP 110/80 | HR 74 | Temp 98.1°F | Resp 16 | Ht 73.0 in | Wt 195.4 lb

## 2022-07-12 DIAGNOSIS — J440 Chronic obstructive pulmonary disease with acute lower respiratory infection: Secondary | ICD-10-CM

## 2022-07-12 DIAGNOSIS — J209 Acute bronchitis, unspecified: Secondary | ICD-10-CM | POA: Diagnosis not present

## 2022-07-12 DIAGNOSIS — R0602 Shortness of breath: Secondary | ICD-10-CM | POA: Diagnosis not present

## 2022-07-12 DIAGNOSIS — R079 Chest pain, unspecified: Secondary | ICD-10-CM | POA: Diagnosis not present

## 2022-07-12 DIAGNOSIS — R059 Cough, unspecified: Secondary | ICD-10-CM | POA: Diagnosis not present

## 2022-07-12 DIAGNOSIS — R062 Wheezing: Secondary | ICD-10-CM | POA: Diagnosis not present

## 2022-07-12 DIAGNOSIS — J029 Acute pharyngitis, unspecified: Secondary | ICD-10-CM | POA: Diagnosis not present

## 2022-07-12 LAB — POCT RAPID STREP A (OFFICE): Rapid Strep A Screen: POSITIVE — AB

## 2022-07-12 LAB — POC COVID19 BINAXNOW: SARS Coronavirus 2 Ag: NEGATIVE

## 2022-07-12 MED ORDER — AMOXICILLIN-POT CLAVULANATE 875-125 MG PO TABS: 1.0000 | ORAL_TABLET | Freq: Two times a day (BID) | ORAL | 0 refills | Status: DC

## 2022-07-12 MED ORDER — PREDNISONE 20 MG PO TABS: ORAL_TABLET | ORAL | 0 refills | Status: DC

## 2022-07-12 NOTE — Progress Notes (Signed)
Established Patient Office Visit  Subjective:  Patient ID: Morgan [name], female    DOB: [birth year]  Age: 71 y.o. MRN: [id number]  CC:  Chief Complaint  Patient presents with   Shortness of Breath   Cough    Woke up this am coughing    HPI Morgan [name] is here today with concerns.   Not feeling so good over the last few days.  She has c/o sob and cough that is non productive.  With a headache as well.  Losing her voice, slight sore throat. No ear pain.  She is not wheezing.  Some slight chest congestion.   No fever or chills.  She does have copd.  Has not tested for covid.   Past Medical History:  Diagnosis Date   Allergy    Anemia    Anxiety    Arthritis    CHF (congestive heart failure) (HCC)    Depression    GERD (gastroesophageal reflux disease)    Hypertension    MVP (mitral valve prolapse)    Parkinson disease ([place])     Past Surgical History:  Procedure Laterality Date   ABDOMINAL HYSTERECTOMY     APPENDECTOMY     BALLOON DILATION N/A 04/21/2020   Procedure: BALLOON DILATION;  Surgeon: Milus Banister, MD;  Location: [facility];  Service: Endoscopy;  Laterality: N/A;  pyloric/ GI   BALLOON DILATION N/A 07/04/2020   Procedure: BALLOON DILATION;  Surgeon: Mauri Pole, MD;  Location: [facility];  Service: Endoscopy;  Laterality: N/A;   BIOPSY  04/21/2020   Procedure: BIOPSY;  Surgeon: Milus Banister, MD;  Location: [facility];  Service: Endoscopy;;   BIOPSY  07/04/2020   Procedure: BIOPSY;  Surgeon: Mauri Pole, MD;  Location: [facility];  Service: Endoscopy;;  EGD and COLON   BREAST SURGERY Right 1988   tumor removal   CHOLECYSTECTOMY N/A 05/18/2020   Procedure: LAPAROSCOPIC CHOLECYSTECTOMY;  Surgeon: Johnathan Hausen, MD;  Location: [facility];  Service: General;  Laterality: N/A;   COLONOSCOPY WITH PROPOFOL N/A 07/04/2020   Procedure: COLONOSCOPY WITH PROPOFOL;  Surgeon: Mauri Pole, MD;  Location: [facility];  Service:  Endoscopy;  Laterality: N/A;   COLONOSCOPY WITH PROPOFOL N/A 11/03/2021   Procedure: COLONOSCOPY WITH PROPOFOL;  Surgeon: Lesly Rubenstein, MD;  Location: [facility];  Service: Endoscopy;  Laterality: N/A;   ESOPHAGEAL DILATION  08/11/2020   Procedure: PYLORIC DILATION;  Surgeon: Doran Stabler, MD;  Location: [facility];  Service: Gastroenterology;;   ESOPHAGOGASTRODUODENOSCOPY (EGD) WITH PROPOFOL N/A 04/21/2020   Procedure: ESOPHAGOGASTRODUODENOSCOPY (EGD) WITH PROPOFOL;  Surgeon: Milus Banister, MD;  [facility];  Service: Endoscopy;  Laterality: N/A;   ESOPHAGOGASTRODUODENOSCOPY (EGD) WITH PROPOFOL N/A 07/04/2020   Procedure: ESOPHAGOGASTRODUODENOSCOPY (EGD) WITH PROPOFOL;  Surgeon: Mauri Pole, MD;  [facility]: [facility];  Service: Endoscopy;  Laterality: N/A;   ESOPHAGOGASTRODUODENOSCOPY (EGD) WITH PROPOFOL N/A 08/11/2020   Procedure: ESOPHAGOGASTRODUODENOSCOPY (EGD) WITH PROPOFOL;  Surgeon: Doran Stabler, MD;  Location: [facility];  Service: Gastroenterology;  Laterality: N/A;   ESOPHAGOGASTRODUODENOSCOPY (EGD) WITH PROPOFOL N/A 11/03/2021   Procedure: ESOPHAGOGASTRODUODENOSCOPY (EGD) WITH PROPOFOL;  Surgeon: Lesly Rubenstein, MD;  Location: [facility];  Service: Endoscopy;  Laterality: N/A;   ESOPHAGOGASTRODUODENOSCOPY (EGD) WITH PROPOFOL N/A 05/07/2022   Procedure: ESOPHAGOGASTRODUODENOSCOPY (EGD) WITH PROPOFOL;  Surgeon: Lin Landsman, MD;  Location: [facility];  Service: Gastroenterology;  Laterality: N/A;   KNEE SURGERY  2005   2 times left  KNEE SURGERY Right    KYPHOPLASTY N/A 07/20/2021   Procedure: T 10KYPHOPLASTY;  Surgeon: Hessie Knows, MD;  Location: [facility];  Service: Orthopedics;  Laterality: N/A;   KYPHOPLASTY N/A 08/23/2021   Procedure: T9 KYPHOPLASTY;  Surgeon: Hessie Knows, MD;  Location: [facility];  Service: Orthopedics;  Laterality: N/A;   POLYPECTOMY  07/04/2020   Procedure: POLYPECTOMY;  Surgeon: Mauri Pole, MD;  Location: [facility];  Service: Endoscopy;;   TUBAL LIGATION      Family History  Problem Relation Age of Onset   Breast cancer Mother 66   Ovarian cancer Mother 41   Brain cancer Mother    Hypertension Father    Hyperlipidemia Father    Heart disease Father    Colon cancer Father        diagnosed in his 37s   Breast cancer Maternal Grandmother 49   Diabetes Paternal Grandmother    Breast cancer Paternal Grandmother 34   Other Son        drug over dose   Colon cancer Maternal Grandfather        dx in his 61s   Liver disease Maternal Grandfather    Breast cancer Maternal Aunt 60   Breast cancer Maternal Aunt 60   Colon cancer Maternal Uncle    Stomach cancer Neg Hx    Pancreatic cancer Neg Hx    Esophageal cancer Neg Hx     Social History   Socioeconomic History   Marital status: Single    Spouse name: Not on file   Number of children: 2   Years of education: Not on file   Highest education level: Not on file  Occupational History   Not on file  Tobacco Use   Smoking status: Former    Packs/day: 1.00    Years: 15.00    Total pack years: 15.00    Types: Cigarettes    Quit date: 09/12/2017    Years since quitting: 4.8   Smokeless tobacco: Never  Vaping Use   Vaping Use: Never used  Substance and Sexual Activity   Alcohol use: Not Currently   Drug use: No   Sexual activity: Not Currently  Other Topics Concern   Not on file  Social History Narrative   Lives with Grandson's great grandparents    20 - 75 years old ([place])   Daughter - [name] - lives in the [place] system - family, aunt, [name] and [name] (who she lives with), brother   Transportation: roommates   Enjoy: [employer], playing with grandson   Exercise: PT exercises   Diet: good - chicken, some steak, tries to eat healthy, avoids fried foods   Retired from being a Quarry manager   Social Determinants of Radio broadcast assistant Strain: [place]  (11/12/2018)   Overall  Financial Resource Strain (CARDIA)    Difficulty of Paying Living Expenses: Not hard at all  Food Insecurity: Not on file  Transportation Needs: Not on file  Physical Activity: Not on file  Stress: Not on file  Social Connections: Not on file  Intimate Partner Violence: Not on file    Outpatient Medications Prior to Visit  Medication Sig Dispense Refill   acetaminophen (TYLENOL) 325 MG tablet Take 2 tablets (650 mg total) by mouth every 4 (four) hours as needed for mild pain (or temp > 37.5 C (99.5 F)).     AIMOVIG 140 MG/ML SOAJ SMARTSIG:140 Milligram(s) SUB-Q Once a Month  aspirin-acetaminophen-caffeine (EXCEDRIN MIGRAINE) 250-250-65 MG tablet Take 1-2 tablets by mouth every 6 (six) hours as needed for headache.     Budeson-Glycopyrrol-Formoterol (BREZTRI AEROSPHERE) 160-9-4.8 MCG/ACT AERO Inhale 2 puffs into the lungs in the morning and at bedtime. 10.7 g 3   carbidopa-levodopa (SINEMET IR) 25-100 MG tablet Take 2 tablets by mouth 3 (three) times daily.     Cholecalciferol 1.25 MG (50000 UT) TABS Take 1 tablet by mouth once a week. 4 tablet 1   KLOR-CON M20 20 MEQ tablet TAKE 1 TABLET BY MOUTH EVERY DAY 90 tablet 1   methylphenidate (RITALIN) 5 MG tablet Take by mouth.     metoCLOPramide (REGLAN) 5 MG tablet Take 1 tablet (5 mg total) by mouth 4 (four) times daily -  before meals and at bedtime. 120 tablet 0   montelukast (SINGULAIR) 10 MG tablet Take 1 tablet (10 mg total) by mouth at bedtime. 30 tablet 11   OXYGEN Inhale 3 L/min into the lungs See admin instructions. 3 L/min at bedtime and as needed for shortness of breath throughout the day     scopolamine (TRANSDERM-SCOP) 1 MG/3DAYS Place 1 patch (1.5 mg total) onto the skin every 3 (three) days. 10 patch 0   diphenhydrAMINE (BENADRYL) 50 MG/ML injection Inject 0.25 mLs (12.5 mg total) into the vein every 8 (eight) hours as needed for up to 5 days (nausea vomiting). 3.75 mL 0   pantoprazole (PROTONIX) 40 MG tablet Take 1 tablet (40  mg total) by mouth 2 (two) times daily before a meal. 60 tablet 0   atorvastatin (LIPITOR) 40 MG tablet TAKE 1 TABLET BY MOUTH EVERY DAY (Patient not taking: Reported on 06/06/2022) 90 tablet 1   fluticasone (FLONASE) 50 MCG/ACT nasal spray Place 1 spray into both nostrils daily. (Patient not taking: Reported on 07/12/2022)  2   mirtazapine (REMERON) 15 MG tablet Take 15 mg by mouth at bedtime. (Patient not taking: Reported on 07/12/2022)     sucralfate (CARAFATE) 1 GM/10ML suspension Take 10 mLs (1 g total) by mouth 2 (two) times daily. (Patient not taking: Reported on 06/06/2022) 600 mL 0   No facility-administered medications prior to visit.    Allergies  Allergen Reactions   Meperidine Hives and Anaphylaxis   Strawberry Extract Hives and Swelling   Sucralfate Nausea Only and Other (See Comments)    Severe nausea Other reaction(s): Other (See Comments) Severe nausea Severe nausea   Wasp Venom Hives        Objective:    Physical Exam Constitutional:      General: She is not in acute distress.    Appearance: Normal appearance. She is well-developed and normal weight. She is not ill-appearing, toxic-appearing or diaphoretic.  HENT:     Right Ear: Tympanic membrane normal.     Left Ear: Tympanic membrane normal.     Nose: Nose normal. No congestion or rhinorrhea.     Right Turbinates: Not enlarged or swollen.     Left Turbinates: Not enlarged or swollen.     Right Sinus: No maxillary sinus tenderness or frontal sinus tenderness.     Left Sinus: No maxillary sinus tenderness or frontal sinus tenderness.     Mouth/Throat:     Mouth: Mucous membranes are dry.     Pharynx: No pharyngeal swelling, oropharyngeal exudate or posterior oropharyngeal erythema.     Tonsils: No tonsillar exudate.  Eyes:     Extraocular Movements: Extraocular movements intact.     Conjunctiva/sclera: Conjunctivae normal.  Pupils: Pupils are equal, round, and reactive to light.  Neck:     Thyroid: No  thyroid mass.  Cardiovascular:     Rate and Rhythm: Normal rate and regular rhythm.  Pulmonary:     Effort: Pulmonary effort is normal.     Breath sounds: Examination of the right-upper field reveals wheezing. Wheezing present. No decreased breath sounds.  Musculoskeletal:     Right lower leg: No edema.     Left lower leg: No edema.  Lymphadenopathy:     Cervical:     Right cervical: No superficial cervical adenopathy.    Left cervical: No superficial cervical adenopathy.  Neurological:     General: No focal deficit present.     Mental Status: She is alert and oriented to person, place, and time. Mental status is at baseline.  Psychiatric:        Mood and Affect: Mood normal.        Behavior: Behavior normal.        Thought Content: Thought content normal.        Judgment: Judgment normal.     BP 110/80   Pulse 74   Temp 98.1 F (36.7 C)   Resp 16   Ht '6\' 1"'$  (1.854 m)   Wt 195 lb 6 oz (88.6 kg)   SpO2 98%   BMI 25.78 kg/m  Wt Readings from Last 3 Encounters:  07/12/22 195 lb 6 oz (88.6 kg)  06/11/22 207 lb (93.9 kg)  06/06/22 206 lb (93.4 kg)     Health Maintenance Due  Topic Date Due   TETANUS/TDAP  Never done   Zoster Vaccines- Shingrix (1 of 2) Never done   MAMMOGRAM  07/16/2019   Pneumonia Vaccine 74+ Years old (3 - PPSV23 or PCV20) 02/24/2020   COVID-19 Vaccine (3 - Moderna risk series) 05/13/2020    There are no preventive care reminders to display for this patient.  Lab Results  Component Value Date   TSH 2.21 04/27/2022   Lab Results  Component Value Date   WBC 9.4 06/11/2022   HGB 10.1 (L) 06/11/2022   HCT 31.9 (L) 06/11/2022   MCV 80.4 06/11/2022   PLT 371 06/11/2022   Lab Results  Component Value Date   NA 139 06/11/2022   K 3.6 06/11/2022   CO2 29 06/11/2022   GLUCOSE 108 (H) 06/11/2022   BUN 11 06/11/2022   CREATININE 0.92 06/11/2022   BILITOT 0.5 06/11/2022   ALKPHOS 134 (H) 06/11/2022   AST 19 06/11/2022   ALT <5 06/11/2022    PROT 5.9 (L) 06/11/2022   ALBUMIN 2.6 (L) 06/11/2022   CALCIUM 8.2 (L) 06/11/2022   ANIONGAP 6 06/11/2022   GFR 65.50 05/24/2022   Lab Results  Component Value Date   HGBA1C 6.2 04/27/2022      Assessment & Plan:   Problem List Items Addressed This Visit       Respiratory   Acute bronchitis with chronic obstructive pulmonary disease (COPD) (HCC)    questionable strep test, borderline positive Will treat for bronchitis/strep with augmentin  rx augmentin 875/125 mg po bid x 10 days Also sending prednisone 20 mg taper for possible copd exacerbation  Advised pt to use oxygen at home for sob  cxr today to r/o pneumonia Pt to f/u with pcp in one week to f/u on condition        Relevant Medications   amoxicillin-clavulanate (AUGMENTIN) 875-125 MG tablet   predniSONE (DELTASONE) 20 MG tablet  Other Relevant Orders   DG Chest 2 View (Completed)     Other   Sore throat    Strep tested in office, borderline positive Warm salt water gargles        Relevant Orders   POCT rapid strep A   POC COVID-19 BinaxNow   SOB (shortness of breath) - Primary    Prednisone taper today rx  cxr pending results      Relevant Medications   amoxicillin-clavulanate (AUGMENTIN) 875-125 MG tablet   predniSONE (DELTASONE) 20 MG tablet   Other Relevant Orders   POCT rapid strep A   POC COVID-19 BinaxNow   DG Chest 2 View (Completed)    Meds ordered this encounter  Medications   amoxicillin-clavulanate (AUGMENTIN) 875-125 MG tablet    Sig: Take 1 tablet by mouth 2 (two) times daily.    Dispense:  20 tablet    Refill:  0    Order Specific Question:   Supervising Provider    Answer:   BEDSOLE, AMY E [[id number]]   predniSONE (DELTASONE) 20 MG tablet    Sig: Take two tablets daily for 3 days followed by one tablet daily for 4 days    Dispense:  10 tablet    Refill:  0    Order Specific Question:   Supervising Provider    Answer:   Diona Browner, AMY E [[id number]]    Follow-up: Return in about 1  week (around 07/19/2022) for with [name] for follow up on acute illness.    Eugenia Pancoast, FNP

## 2022-07-12 NOTE — Progress Notes (Signed)
D/w pt in office results of covid and strep.  Treated accordingly.

## 2022-07-12 NOTE — Assessment & Plan Note (Signed)
Prednisone taper today rx  cxr pending results

## 2022-07-12 NOTE — Telephone Encounter (Signed)
Agree with precautions given to pt  Agree with nurse assessment in plan.  Thank you for speaking with them. 

## 2022-07-12 NOTE — Assessment & Plan Note (Signed)
Strep tested in office, borderline positive Warm salt water gargles

## 2022-07-12 NOTE — Telephone Encounter (Signed)
Appreciate Tabitha seeing patient

## 2022-07-12 NOTE — Assessment & Plan Note (Signed)
questionable strep test, borderline positive Will treat for bronchitis/strep with augmentin  rx augmentin 875/125 mg po bid x 10 days Also sending prednisone 20 mg taper for possible copd exacerbation  Advised pt to use oxygen at home for sob  cxr today to r/o pneumonia Pt to f/u with pcp in one week to f/u on condition

## 2022-07-13 ENCOUNTER — Telehealth: Payer: Self-pay | Admitting: Family Medicine

## 2022-07-13 DIAGNOSIS — R112 Nausea with vomiting, unspecified: Secondary | ICD-10-CM

## 2022-07-13 IMAGING — DX DG KNEE COMPLETE 4+V*R*
4 series · 4 of 4 positions shown · non-contrast
Comparison: None.

CLINICAL DATA: Pain post twisting injury.

EXAM:
RIGHT KNEE - COMPLETE 4+ VIEW

[knee ap]
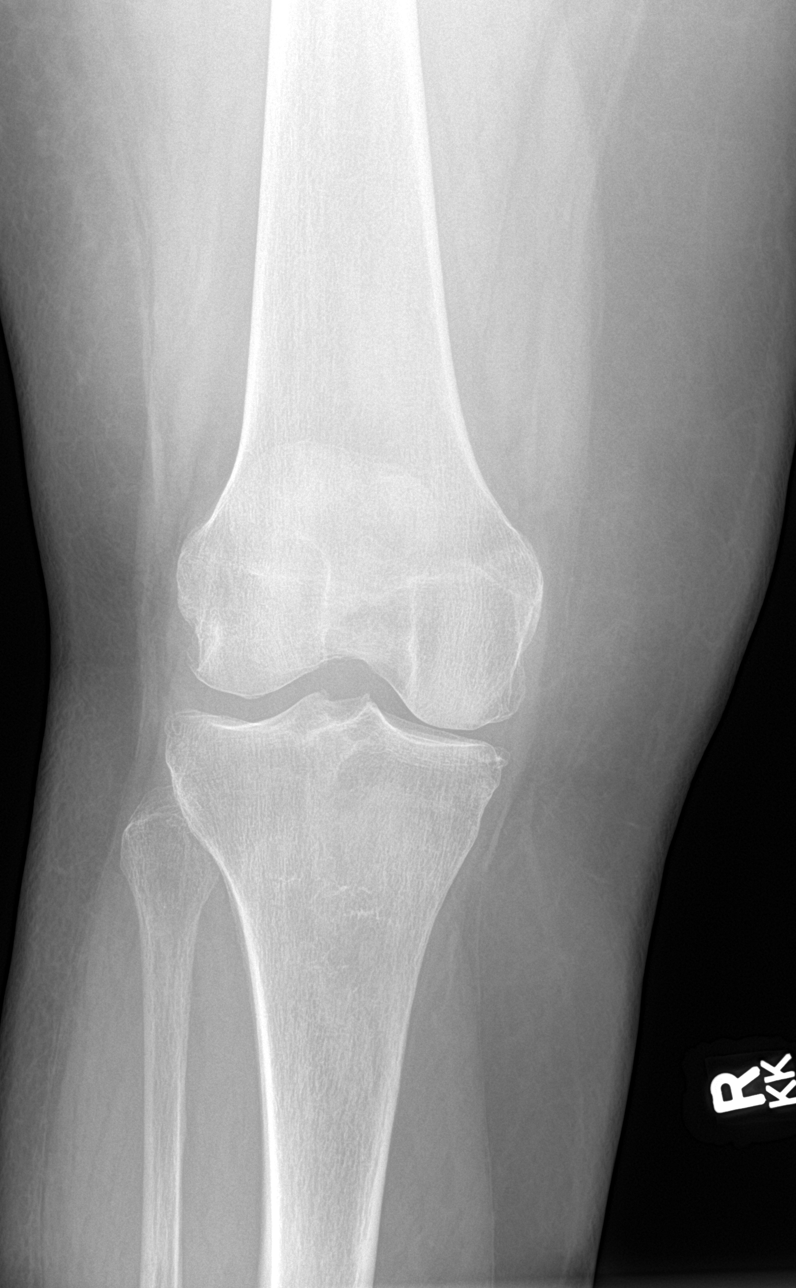

[knee lat]
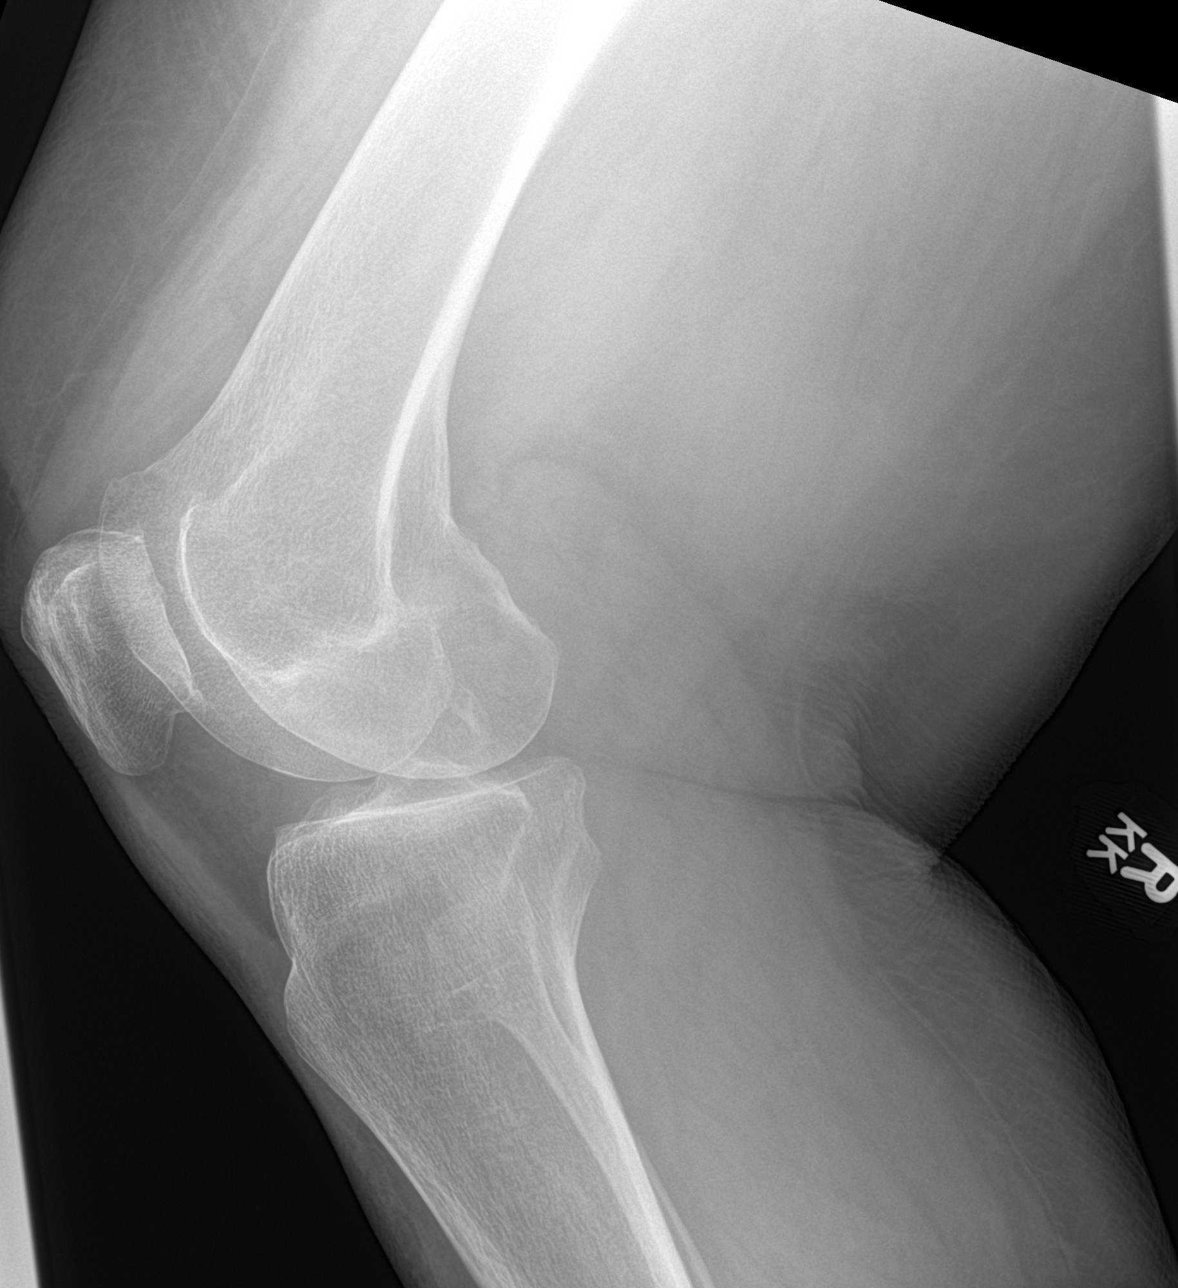

[knee obl (1 of 2)]
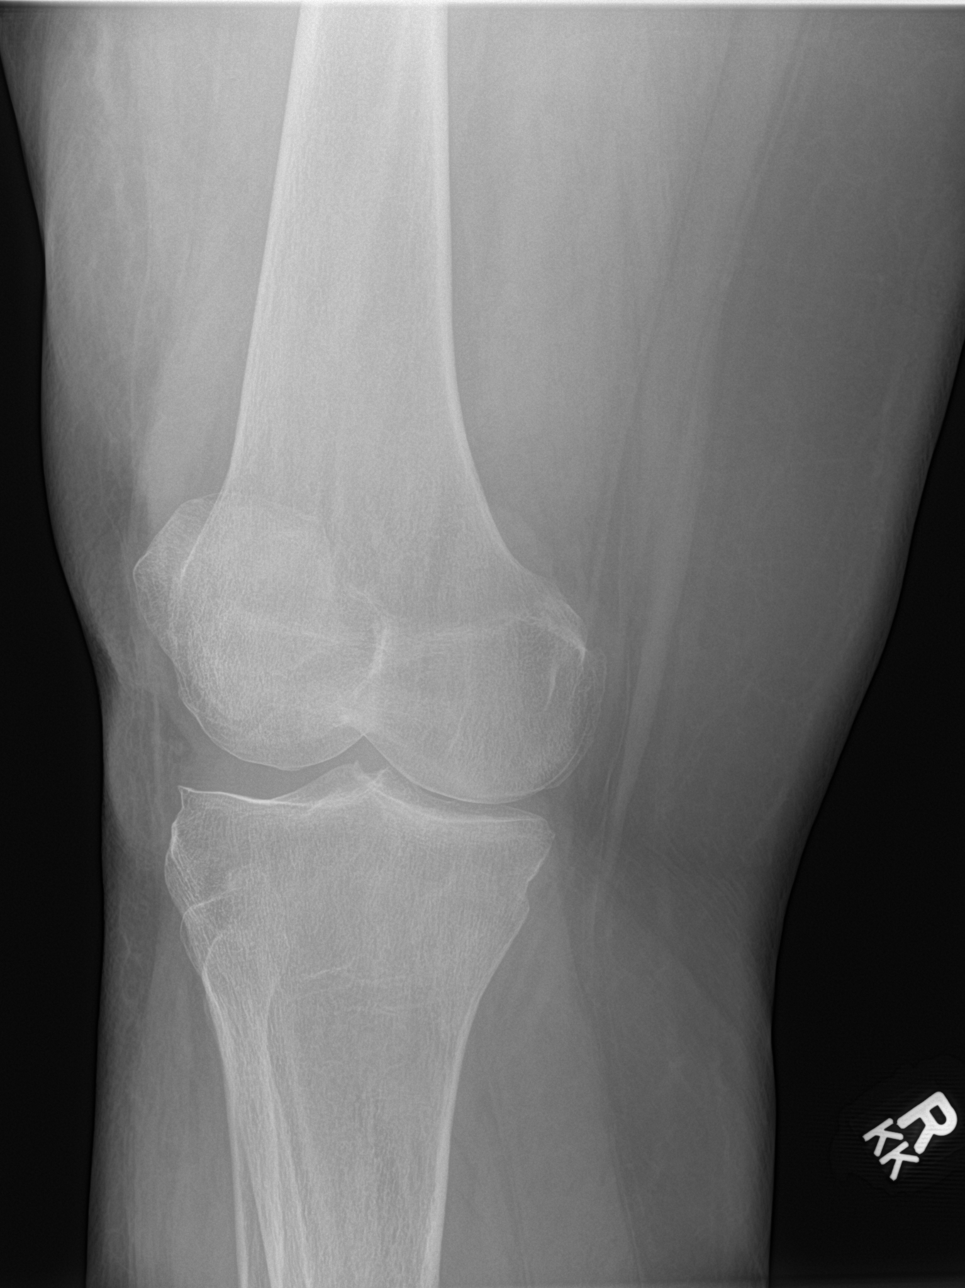

[knee obl (2 of 2)]
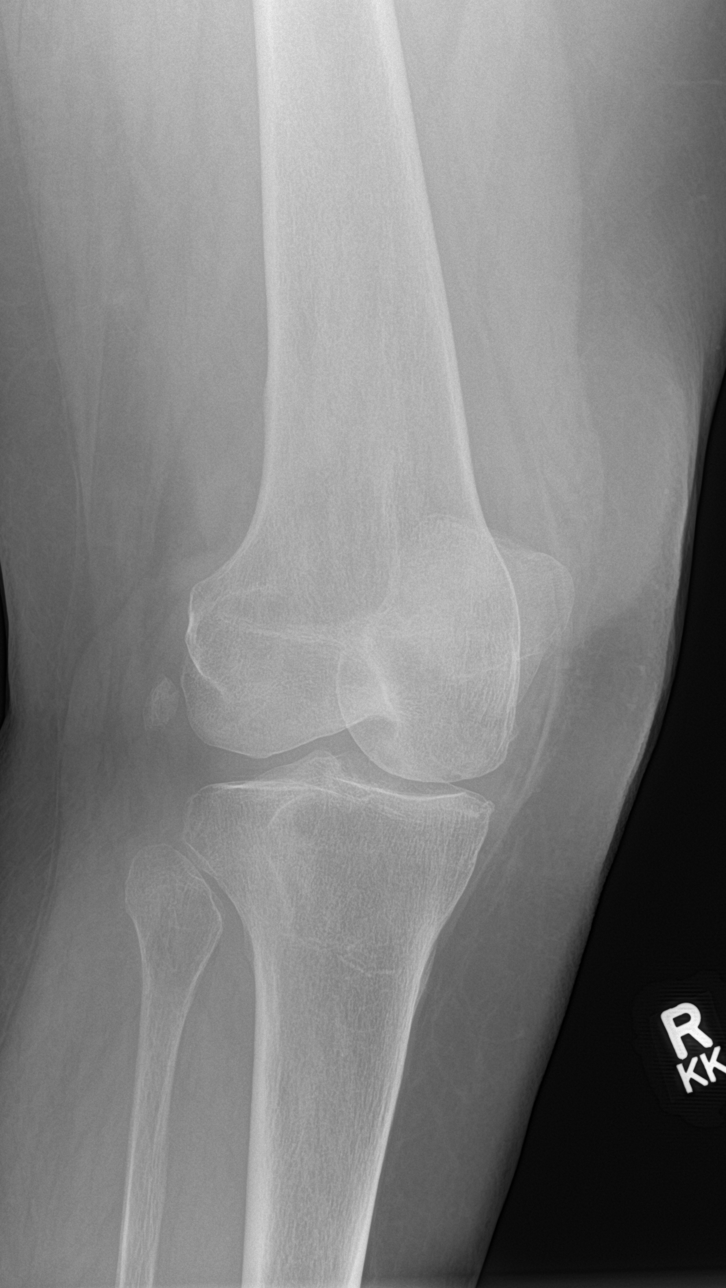

[4 of 4 positions shown; findings below may reference images not displayed]

FINDINGS: No evidence of fracture, dislocation, or joint effusion. No evidence
of arthropathy or other focal bone abnormality. Mild suprapatellar
soft tissue swelling.
IMPRESSION: 1. No acute fracture or dislocation identified about the right knee.
2. Mild suprapatellar soft tissue swelling.

## 2022-07-13 MED ORDER — METOCLOPRAMIDE HCL 5 MG PO TABS: 5.0000 mg | ORAL_TABLET | Freq: Three times a day (TID) | ORAL | 0 refills | Status: DC

## 2022-07-13 NOTE — Telephone Encounter (Signed)
Refill provided

## 2022-07-13 NOTE — Progress Notes (Signed)
No pneumonia seen on xray, no acute findings.  Stable chronic findings. Follow up with primary care on this.  Continue medication prescribed. If any worsening sob please seek more urgent care.

## 2022-07-13 NOTE — Telephone Encounter (Signed)
  Encourage patient to contact the pharmacy for refills or they can request refills through [facility]  Did the patient contact the pharmacy:  yes   LAST APPOINTMENT DATE:  Please schedule appointment if longer than 1 year  NEXT APPOINTMENT DATE:07/19/2022  MEDICATION:metoCLOPramide metoCLOPramide (REGLAN) 5 MG tablet  Is the patient out of medication? yes  If not, how much is left?  Is this a 90 day supply: yes  [id number] [place]  [phone] Fax:  [phone]     Let patient know to contact pharmacy at the end of the day to make sure medication is ready.  Please notify patient to allow 48-72 hours to process

## 2022-07-16 ENCOUNTER — Ambulatory Visit (INDEPENDENT_AMBULATORY_CARE_PROVIDER_SITE_OTHER): Payer: Medicare HMO | Admitting: Pulmonary Disease

## 2022-07-16 ENCOUNTER — Telehealth: Payer: Self-pay | Admitting: Pulmonary Disease

## 2022-07-16 ENCOUNTER — Encounter: Payer: Self-pay | Admitting: Pulmonary Disease

## 2022-07-16 VITALS — BP 114/78 | HR 98 | Temp 97.9°F | Ht 71.0 in | Wt 192.2 lb

## 2022-07-16 DIAGNOSIS — R0609 Other forms of dyspnea: Secondary | ICD-10-CM | POA: Diagnosis not present

## 2022-07-16 DIAGNOSIS — J9611 Chronic respiratory failure with hypoxia: Secondary | ICD-10-CM

## 2022-07-16 DIAGNOSIS — J984 Other disorders of lung: Secondary | ICD-10-CM

## 2022-07-16 LAB — PULMONARY FUNCTION TEST
DL/VA % pred: 98 %
DL/VA: 3.9 ml/min/mmHg/L
DLCO cor % pred: 61 %
DLCO cor: 14.95 ml/min/mmHg
DLCO unc % pred: 53 %
DLCO unc: 13.18 ml/min/mmHg
FEF 25-75 Post: 2.41 L/sec
FEF 25-75 Pre: 1.84 L/sec
FEF2575-%Change-Post: 30 %
FEF2575-%Pred-Post: 101 %
FEF2575-%Pred-Pre: 77 %
FEV1-%Change-Post: 8 %
FEV1-%Pred-Post: 61 %
FEV1-%Pred-Pre: 56 %
FEV1-Post: 1.84 L
FEV1-Pre: 1.69 L
FEV1FVC-%Change-Post: 0 %
FEV1FVC-%Pred-Pre: 107 %
FEV6-%Change-Post: 8 %
FEV6-%Pred-Post: 59 %
FEV6-%Pred-Pre: 54 %
FEV6-Post: 2.24 L
FEV6-Pre: 2.07 L
FEV6FVC-%Pred-Post: 104 %
FEV6FVC-%Pred-Pre: 104 %
FVC-%Change-Post: 8 %
FVC-%Pred-Post: 56 %
FVC-%Pred-Pre: 52 %
FVC-Post: 2.24 L
FVC-Pre: 2.07 L
Post FEV1/FVC ratio: 82 %
Post FEV6/FVC ratio: 100 %
Pre FEV1/FVC ratio: 82 %
Pre FEV6/FVC Ratio: 100 %
RV % pred: 92 %
RV: 2.33 L
TLC % pred: 76 %
TLC: 4.66 L

## 2022-07-16 NOTE — Progress Notes (Signed)
FYI

## 2022-07-16 NOTE — Progress Notes (Signed)
$'@Patient'I$  ID: Morgan [name], female    DOB: 05/22/1951, 71 y.o.   MRN: [id number]  Chief Complaint  Patient presents with   Follow-up    DOE    Referring provider: [name], MD  HPI:   71 y.o. woman whom we are seeing in follow up for evaluation of chronic cough/bronchitis.  Most recent from PCP reviewed. Recent hematology reviewed.  Returns for routine follow-up.  Dyspnea largely unchanged.  Has not reported [name] adherence to [facility].  Has not really helped.  PFTs today for further evaluation fully interpreted below but most concerning for potential scarring or fibrosis in the lung with mild restriction and moderately reduced DLCO.  She also endorses lightheadedness.  Sounds like occurs with exertion, walking.  A bit better after stopping blood pressure medications.  Not truly orthostatic at this time.  But has "white out " vision when walking.  HPI at initial visit: Patient reports onset of symptoms about 2 months ago.  Started with fever, runny nose, sore throat.  The symptoms resolved but cough persisted.  Productive.  Initially yellow to clear phlegm.  Worsened over the last months to more green phlegm.  Associate with worsening dyspnea on exertion.  No times a day with things are better or worse.  No position to make things better or worse.  She thinks the pollens have made nasal congestion and some cough worse given chronic issues with pollens and nasal/sinus congestion with seasonal changes in the past.  Has been using Advair without much improvement.  Was given a course of prednisone and Augmentin about 1 month ago.  This did help for period of time but symptoms have recurred and back to as bad as they were prior.  Was placed on prednisone and levofloxacin yesterday via PCP.  Review chest x-ray 03/14/2022 that on my review and interpretation reveals hyperinflation with what appears to be chronic bronchial scarring/inflammation.  Review chest x-ray 04/12/2022 that on my  interpretation is unchanged from earlier x-ray in March.  PMH: Tobacco abuse in remission, seasonal allergies, hypertension Surgical history: Hysterectomy, appendectomy, esophageal dilation, knee surgery, cholecystectomy Family history: Mother with history of breast cancer, ovarian cancer, father with hypertension, hyperlipidemia, CAD, colon cancer in 34s Social history: Former smoker, Printmaker, quit 2015, retired Futures trader / Pulmonary Flowsheets:   ACT:      No data to display           MMRC:     No data to display           Epworth:      No data to display           Tests:   FENO:  No results found for: "NITRICOXIDE"  PFT:    Latest Ref Rng & Units 07/16/2022    8:57 AM  PFT Results  FVC-Pre L 2.07  P  FVC-Predicted Pre % 52  P  FVC-Post L 2.24  P  FVC-Predicted Post % 56  P  Pre FEV1/FVC % % 82  P  Post FEV1/FCV % % 82  P  FEV1-Pre L 1.69  P  FEV1-Predicted Pre % 56  P  FEV1-Post L 1.84  P  DLCO uncorrected ml/min/mmHg 13.18  P  DLCO UNC% % 53  P  DLCO corrected ml/min/mmHg 14.95  P  DLCO COR %Predicted % 61  P  DLVA Predicted % 98  P  TLC L 4.66  P  TLC % Predicted % 76  P  RV % Predicted % 92  P    P Preliminary result   Personally reviewed interpreted as spirometry just above moderate restriction versus air trapping.  No bronchodilator spots.  TLC confirms mild restriction.  DLCO moderately reduced.  WALK:      No data to display           Imaging: Personally reviewed and as per EMR discussion this note DG Chest 2 View  Result Date: 07/12/2022 CLINICAL DATA:  71 year old female with history of shortness of breath, wheezing, cough and chest pain since 07/07/2022. EXAM: CHEST - 2 VIEW COMPARISON:  Chest x-ray 04/12/2022. FINDINGS: Lung volumes are normal. No acute consolidative airspace disease. No pleural effusions. Areas of mild architectural distortion are again noted in the right middle lobe and lingula, similar to  the prior study, presumably areas of mild chronic post infectious or inflammatory scarring. Bilateral apical pleuroparenchymal thickening and architectural distortion is also similar to prior examinations, also likely areas of chronic post infectious or inflammatory scarring. No pneumothorax. No definite suspicious appearing pulmonary nodules or masses are noted. No evidence of pulmonary edema. Heart size is normal. Upper mediastinal contours are within normal limits. Post vertebroplasty changes are noted in the lower thoracic spine. IMPRESSION: 1. No radiographic evidence of acute cardiopulmonary disease. The appearance of the lungs is similar to prior studies, as above. Electronically Signed   By: Vinnie Langton M.D.   On: 07/12/2022 09:25    Lab Results: Personally reviewed CBC    Component Value Date/Time   WBC 9.4 06/11/2022 1319   RBC 3.97 06/11/2022 1319   RBC 3.97 06/11/2022 1319   HGB 10.1 (L) 06/11/2022 1319   HGB 12.4 09/18/2012 2000   HCT 31.9 (L) 06/11/2022 1319   HCT 37.3 09/18/2012 2000   PLT 371 06/11/2022 1319   PLT 450 (H) 09/18/2012 2000   MCV 80.4 06/11/2022 1319   MCV 86 09/18/2012 2000   MCH 25.4 (L) 06/11/2022 1319   MCHC 31.7 06/11/2022 1319   RDW 18.9 (H) 06/11/2022 1319   RDW 14.5 09/18/2012 2000   LYMPHSABS 2.3 06/11/2022 1319   MONOABS 0.4 06/11/2022 1319   EOSABS 0.2 06/11/2022 1319   BASOSABS 0.0 06/11/2022 1319    BMET    Component Value Date/Time   NA 139 06/11/2022 1319   NA 143 12/26/2018 0000   NA 139 09/18/2012 2000   K 3.6 06/11/2022 1319   K 3.3 (L) 09/18/2012 2000   CL 104 06/11/2022 1319   CL 99 09/18/2012 2000   CO2 29 06/11/2022 1319   CO2 33 (H) 09/18/2012 2000   GLUCOSE 108 (H) 06/11/2022 1319   GLUCOSE 111 (H) 09/18/2012 2000   BUN 11 06/11/2022 1319   BUN 16 12/26/2018 0000   BUN 13 09/18/2012 2000   CREATININE 0.92 06/11/2022 1319   CREATININE 0.72 09/18/2012 2000   CALCIUM 8.2 (L) 06/11/2022 1319   CALCIUM 9.4  09/18/2012 2000   GFRNONAA >60 06/11/2022 1319   GFRNONAA >60 09/18/2012 2000   GFRAA >60 09/02/2020 1658   GFRAA >60 09/18/2012 2000    BNP    Component Value Date/Time   BNP 69.0 05/04/2022 0922    ProBNP    Component Value Date/Time   PROBNP 180.0 (H) 08/29/2021 0931    Specialty Problems       Pulmonary Problems   Oropharyngeal dysphagia   Oxygen dependent    At bedtime      Chronic respiratory failure with hypoxia (HCC)  COPD (chronic obstructive pulmonary disease) (HCC)   DOE (dyspnea on exertion)   Acute bronchitis with chronic obstructive pulmonary disease (COPD) (HCC)   SOB (shortness of breath)   Sore throat    Allergies  Allergen Reactions   Meperidine Hives and Anaphylaxis   Strawberry Extract Hives and Swelling   Sucralfate Nausea Only and Other (See Comments)    Severe nausea Other reaction(s): Other (See Comments) Severe nausea Severe nausea   Wasp Venom Hives    Immunization History  Administered Date(s) Administered   Fluad Quad(high Dose 65+) 09/30/2019, 08/29/2020, 08/28/2021   Influenza, High Dose Seasonal PF 08/23/2018   Influenza,inj,Quad PF,6+ Mos 02/24/2015   Moderna Sars-Covid-2 Vaccination 03/18/2020, 04/15/2020   Pneumococcal Conjugate-13 11/12/2018   Pneumococcal Polysaccharide-23 02/24/2015    Past Medical History:  Diagnosis Date   Allergy    Anemia    Anxiety    Arthritis    CHF (congestive heart failure) (HCC)    Depression    GERD (gastroesophageal reflux disease)    Hypertension    MVP (mitral valve prolapse)    Parkinson disease (HCC)     Tobacco History: Social History   Tobacco Use  Smoking Status Former   Packs/day: 1.00   Years: 15.00   Total pack years: 15.00   Types: Cigarettes   Quit date: 09/12/2017   Years since quitting: 4.8  Smokeless Tobacco Never   Counseling given: Not Answered   Continue to not smoke  Outpatient Encounter Medications as of 07/16/2022  Medication Sig    acetaminophen (TYLENOL) 325 MG tablet Take 2 tablets (650 mg total) by mouth every 4 (four) hours as needed for mild pain (or temp > 37.5 C (99.5 F)).   AIMOVIG 140 MG/ML SOAJ SMARTSIG:140 Milligram(s) SUB-Q Once a Month   amoxicillin-clavulanate (AUGMENTIN) 875-125 MG tablet Take 1 tablet by mouth 2 (two) times daily.   aspirin-acetaminophen-caffeine (EXCEDRIN MIGRAINE) 250-250-65 MG tablet Take 1-2 tablets by mouth every 6 (six) hours as needed for headache.   Budeson-Glycopyrrol-Formoterol (BREZTRI AEROSPHERE) 160-9-4.8 MCG/ACT AERO Inhale 2 puffs into the lungs in the morning and at bedtime.   carbidopa-levodopa (SINEMET IR) 25-100 MG tablet Take 2 tablets by mouth 3 (three) times daily.   Cholecalciferol 1.25 MG (50000 UT) TABS Take 1 tablet by mouth once a week.   KLOR-CON M20 20 MEQ tablet TAKE 1 TABLET BY MOUTH EVERY DAY   methylphenidate (RITALIN) 5 MG tablet Take by mouth.   metoCLOPramide (REGLAN) 5 MG tablet Take 1 tablet (5 mg total) by mouth 4 (four) times daily -  before meals and at bedtime.   montelukast (SINGULAIR) 10 MG tablet Take 1 tablet (10 mg total) by mouth at bedtime.   OXYGEN Inhale 3 L/min into the lungs See admin instructions. 3 L/min at bedtime and as needed for shortness of breath throughout the day   predniSONE (DELTASONE) 20 MG tablet Take two tablets daily for 3 days followed by one tablet daily for 4 days   scopolamine (TRANSDERM-SCOP) 1 MG/3DAYS Place 1 patch (1.5 mg total) onto the skin every 3 (three) days.   diphenhydrAMINE (BENADRYL) 50 MG/ML injection Inject 0.25 mLs (12.5 mg total) into the vein every 8 (eight) hours as needed for up to 5 days (nausea vomiting).   pantoprazole (PROTONIX) 40 MG tablet Take 1 tablet (40 mg total) by mouth 2 (two) times daily before a meal.   No facility-administered encounter medications on file as of 07/16/2022.     Review of Systems  Review  of Systems  N/a Physical Exam  BP 114/78 (BP Location: Left Arm, Patient  Position: Sitting, Cuff Size: Normal)   Pulse 98   Temp 97.9 F (36.6 C) (Oral)   Ht '5\' 11"'$  (1.803 m)   Wt 192 lb 3.2 oz (87.2 kg)   SpO2 99%   BMI 26.81 kg/m   Wt Readings from Last 5 Encounters:  07/16/22 192 lb 3.2 oz (87.2 kg)  07/12/22 195 lb 6 oz (88.6 kg)  06/11/22 207 lb (93.9 kg)  06/06/22 206 lb (93.4 kg)  05/24/22 203 lb 6 oz (92.3 kg)    BMI Readings from Last 5 Encounters:  07/16/22 26.81 kg/m  07/12/22 25.78 kg/m  06/11/22 27.31 kg/m  06/06/22 27.18 kg/m  05/24/22 26.83 kg/m     Physical Exam General: Sitting in chair, no acute distress Eyes: EOMI, icterus Neck: Supple, no JVP Pulmonary: Distant, clear Abdomen: Nondistended, bowel sounds present CV: Regular rate and rhythm, no murmur MSK: No synovitis, joint effusion Neuro: Normal gait, no weakness Psych: Normal mood, full affect   Assessment & Plan:    Dyspnea on exertion: Worse with recent illness although preceded current issues.  High suspicion for asthma versus smoking-related disease .  Chest x-ray showed interstitial prominence felt may be due to chronic bronchitis.  However, PFTs 06/2022 without fixed obstruction, no bronchodilator spots, with mildly reduced TLC as well as moderately reduced DLCO concerning for lung scarring and fibrosis.  CT high-resolution for further evaluation.  Chronic hypoxemic respiratory failure: Using at night.  Now some during the day.  She does not know what her oxygen saturation is.  Encouraged her to check her oxygen saturation with an O2 monitor.  Encouraged to keep it over 88%.  If it is needed with exertion, please contact us and let us know how much she is needing.  Using courage to increase oxygen flow rate to achieve an oxygen saturation of at least 88%.   Return in about 3 months (around 10/16/2022).   Lanier Clam, MD 07/16/2022

## 2022-07-16 NOTE — Progress Notes (Signed)
Full PFT Performed Today  

## 2022-07-16 NOTE — Patient Instructions (Signed)
It is nice to see you again  Based on the results of your pulmonary function test, I am going to order a special high-resolution CT scan of the chest.  I ordered this for [facility].  Based on the results I will let you know next steps if any are needed.  I think this may be why you are short of breath, I am worried about scarring related to old pneumonias that have healed with scarring.  For the lightheadedness or feel like passing out, I would contact your cardiologist.  I will send him a message.  Return to clinic in 3 months or sooner if needed with Dr. Silas Flood, if we need to change follow-up based on CT scan results I will let you know

## 2022-07-16 NOTE — Telephone Encounter (Signed)
Called patient to inform her of when CT scan was scheduled. Nothing further needed

## 2022-07-16 NOTE — Patient Instructions (Signed)
Full PFT Performed Today  

## 2022-07-19 ENCOUNTER — Ambulatory Visit (INDEPENDENT_AMBULATORY_CARE_PROVIDER_SITE_OTHER): Payer: Medicare HMO | Admitting: Family Medicine

## 2022-07-19 ENCOUNTER — Other Ambulatory Visit: Payer: Self-pay

## 2022-07-19 VITALS — BP 118/70 | HR 70 | Temp 97.0°F | Wt 197.5 lb

## 2022-07-19 DIAGNOSIS — Z1231 Encounter for screening mammogram for malignant neoplasm of breast: Secondary | ICD-10-CM | POA: Diagnosis not present

## 2022-07-19 DIAGNOSIS — R0602 Shortness of breath: Secondary | ICD-10-CM

## 2022-07-19 DIAGNOSIS — J9611 Chronic respiratory failure with hypoxia: Secondary | ICD-10-CM

## 2022-07-19 DIAGNOSIS — R112 Nausea with vomiting, unspecified: Secondary | ICD-10-CM

## 2022-07-19 MED ORDER — SCOPOLAMINE 1 MG/3DAYS TD PT72: 1.0000 | MEDICATED_PATCH | TRANSDERMAL | 0 refills | Status: DC

## 2022-07-19 NOTE — Assessment & Plan Note (Signed)
Stable on nighttime O2. Pulse Ox following activity dropping as low as 86% and taking 30 minutes to recover on 3.5L. Will discuss portable oxygen with pulmonology to help with activity.

## 2022-07-19 NOTE — Patient Instructions (Signed)
Please call the location of your choice from the menu below to schedule your Mammogram and/or Bone Density appointment.    [facility]                      Phone:  [phone]                               [place], [place] [address]                                                             Services: Traditional and 3D Mammogram, Bone Density

## 2022-07-19 NOTE — Assessment & Plan Note (Signed)
Stable symptoms, patient with 60 pound weight loss over the last year.  She is following with GI and has had colonoscopy and endoscopy without signs of cancer.  Continue scopolamine and Reglan as needed.

## 2022-07-19 NOTE — Assessment & Plan Note (Signed)
Improved. She is following with pulmonology, she saw them last week.  She completed a course of Augmentin her sore throat has resolved she also completed a course of prednisone she notes improvement.  Wheezing on exam today.  She is getting a CT scan in the next few days to further evaluate her lung functioning.  Per pulmonology they do not think she has COPD question possible asthma but DLCO was also reduced question possible restrictive lung.

## 2022-07-19 NOTE — Progress Notes (Signed)
Subjective:     Morgan [name] is a 71 y.o. female presenting for Follow-up (1 week)     HPI  #SOB/Strept - head is stopped up - took augmentin  - Saw Pulm - does not think it is COPD - is planning high intensity  - CT is getting done tomorrow - ear pressure - fullness - trid peroxide w/o help - sore throat and congestion have resolved - not currently taking cold medication - is still needing O2 during the day - 3.5L with activity - pulse ox 86% -  - does not have a portable oxygen concentrator    #nausea/vomiting - overall controlled  - has lost about 60 lbs - sees GI   Review of Systems   Social History   Tobacco Use  Smoking Status Former   Packs/day: 1.00   Years: 15.00   Total pack years: 15.00   Types: Cigarettes   Quit date: 09/12/2017   Years since quitting: 4.8  Smokeless Tobacco Never        Objective:    BP Readings from Last 3 Encounters:  07/19/22 118/70  07/16/22 114/78  07/12/22 110/80   Wt Readings from Last 3 Encounters:  07/19/22 197 lb 8 oz (89.6 kg)  07/16/22 192 lb 3.2 oz (87.2 kg)  07/12/22 195 lb 6 oz (88.6 kg)    BP 118/70   Pulse 70   Temp (!) 97 F (36.1 C) (Temporal)   Wt 197 lb 8 oz (89.6 kg)   SpO2 99%   BMI 27.55 kg/m    Physical Exam Constitutional:      General: She is not in acute distress.    Appearance: She is well-developed. She is not diaphoretic.  HENT:     Head: Normocephalic and atraumatic.     Right Ear: Tympanic membrane, ear canal and external ear normal.     Left Ear: Tympanic membrane, ear canal and external ear normal.     Nose: Nose normal.     Mouth/Throat:     Mouth: Mucous membranes are moist.  Eyes:     Conjunctiva/sclera: Conjunctivae normal.  Cardiovascular:     Rate and Rhythm: Normal rate and regular rhythm.     Heart sounds: No murmur heard. Pulmonary:     Effort: Pulmonary effort is normal.     Breath sounds: Rales (faint bibasilar) present. No wheezing or rhonchi.   Musculoskeletal:     Cervical back: Neck supple.  Skin:    General: Skin is warm and dry.     Capillary Refill: Capillary refill takes less than 2 seconds.  Neurological:     Mental Status: She is alert. Mental status is at baseline.  Psychiatric:        Mood and Affect: Mood normal.        Behavior: Behavior normal.           Assessment & Plan:   Problem List Items Addressed This Visit       Respiratory   Chronic respiratory failure with hypoxia (HCC)    Stable on nighttime O2. Pulse Ox following activity dropping as low as 86% and taking 30 minutes to recover on 3.5L. Will discuss portable oxygen with pulmonology to help with activity.         Digestive   Nausea and vomiting in adult - Primary    Stable symptoms, patient with 60 pound weight loss over the last year.  She is following with GI and has had colonoscopy and  endoscopy without signs of cancer.  Continue scopolamine and Reglan as needed.       Relevant Medications   scopolamine (TRANSDERM-SCOP) 1 MG/3DAYS     Other   SOB (shortness of breath)    Improved. She is following with pulmonology, she saw them last week.  She completed a course of Augmentin her sore throat has resolved she also completed a course of prednisone she notes improvement.  Wheezing on exam today.  She is getting a CT scan in the next few days to further evaluate her lung functioning.  Per pulmonology they do not think she has COPD question possible asthma but DLCO was also reduced question possible restrictive lung.      Other Visit Diagnoses     Encounter for screening mammogram for malignant neoplasm of breast       Relevant Orders   MM 3D SCREEN BREAST BILATERAL        Return if symptoms worsen or fail to improve.  [name], MD

## 2022-07-20 ENCOUNTER — Ambulatory Visit
Admission: RE | Admit: 2022-07-20 | Discharge: 2022-07-20 | Disposition: A | Discharge status: Home / Self Care | Payer: Medicare HMO | Source: Ambulatory Visit | Attending: Pulmonary Disease | Admitting: Pulmonary Disease

## 2022-07-20 DIAGNOSIS — I700 Atherosclerosis of aorta: Secondary | ICD-10-CM | POA: Diagnosis not present

## 2022-07-20 DIAGNOSIS — J84112 Idiopathic pulmonary fibrosis: Secondary | ICD-10-CM | POA: Diagnosis not present

## 2022-07-20 DIAGNOSIS — J984 Other disorders of lung: Secondary | ICD-10-CM | POA: Diagnosis not present

## 2022-07-20 DIAGNOSIS — I712 Thoracic aortic aneurysm, without rupture, unspecified: Secondary | ICD-10-CM | POA: Diagnosis not present

## 2022-07-20 DIAGNOSIS — R918 Other nonspecific abnormal finding of lung field: Secondary | ICD-10-CM | POA: Diagnosis not present

## 2022-07-27 DIAGNOSIS — J449 Chronic obstructive pulmonary disease, unspecified: Secondary | ICD-10-CM | POA: Diagnosis not present

## 2022-07-27 DIAGNOSIS — I509 Heart failure, unspecified: Secondary | ICD-10-CM | POA: Diagnosis not present

## 2022-08-03 ENCOUNTER — Inpatient Hospital Stay
Admission: EM | Admit: 2022-08-03 | Discharge: 2022-08-06 | DRG: 641 | Disposition: A | Discharge status: Home / Self Care | Payer: Medicare HMO | Attending: Internal Medicine | Admitting: Internal Medicine

## 2022-08-03 ENCOUNTER — Other Ambulatory Visit: Payer: Self-pay

## 2022-08-03 ENCOUNTER — Other Ambulatory Visit: Payer: Self-pay | Admitting: Family Medicine

## 2022-08-03 ENCOUNTER — Telehealth: Payer: Self-pay | Admitting: Family Medicine

## 2022-08-03 ENCOUNTER — Emergency Department: Payer: Medicare HMO

## 2022-08-03 ENCOUNTER — Encounter: Payer: Self-pay | Admitting: Intensive Care

## 2022-08-03 DIAGNOSIS — Z9981 Dependence on supplemental oxygen: Secondary | ICD-10-CM

## 2022-08-03 DIAGNOSIS — G43909 Migraine, unspecified, not intractable, without status migrainosus: Secondary | ICD-10-CM | POA: Diagnosis present

## 2022-08-03 DIAGNOSIS — E860 Dehydration: Secondary | ICD-10-CM | POA: Diagnosis not present

## 2022-08-03 DIAGNOSIS — G20A1 Parkinson's disease without dyskinesia, without mention of fluctuations: Secondary | ICD-10-CM | POA: Diagnosis present

## 2022-08-03 DIAGNOSIS — M8580 Other specified disorders of bone density and structure, unspecified site: Secondary | ICD-10-CM

## 2022-08-03 DIAGNOSIS — Z9049 Acquired absence of other specified parts of digestive tract: Secondary | ICD-10-CM

## 2022-08-03 DIAGNOSIS — I130 Hypertensive heart and chronic kidney disease with heart failure and stage 1 through stage 4 chronic kidney disease, or unspecified chronic kidney disease: Secondary | ICD-10-CM | POA: Diagnosis present

## 2022-08-03 DIAGNOSIS — D649 Anemia, unspecified: Secondary | ICD-10-CM | POA: Diagnosis present

## 2022-08-03 DIAGNOSIS — K5730 Diverticulosis of large intestine without perforation or abscess without bleeding: Secondary | ICD-10-CM | POA: Diagnosis not present

## 2022-08-03 DIAGNOSIS — N183 Chronic kidney disease, stage 3 unspecified: Secondary | ICD-10-CM | POA: Diagnosis present

## 2022-08-03 DIAGNOSIS — Z800 Family history of malignant neoplasm of digestive organs: Secondary | ICD-10-CM

## 2022-08-03 DIAGNOSIS — G20 Parkinson's disease: Secondary | ICD-10-CM | POA: Diagnosis present

## 2022-08-03 DIAGNOSIS — J449 Chronic obstructive pulmonary disease, unspecified: Secondary | ICD-10-CM | POA: Diagnosis present

## 2022-08-03 DIAGNOSIS — G4489 Other headache syndrome: Secondary | ICD-10-CM | POA: Diagnosis not present

## 2022-08-03 DIAGNOSIS — Z91038 Other insect allergy status: Secondary | ICD-10-CM

## 2022-08-03 DIAGNOSIS — R519 Headache, unspecified: Secondary | ICD-10-CM | POA: Diagnosis not present

## 2022-08-03 DIAGNOSIS — Z8249 Family history of ischemic heart disease and other diseases of the circulatory system: Secondary | ICD-10-CM

## 2022-08-03 DIAGNOSIS — Z90710 Acquired absence of both cervix and uterus: Secondary | ICD-10-CM

## 2022-08-03 DIAGNOSIS — R404 Transient alteration of awareness: Secondary | ICD-10-CM | POA: Diagnosis not present

## 2022-08-03 DIAGNOSIS — G20C Parkinsonism, unspecified: Secondary | ICD-10-CM | POA: Diagnosis present

## 2022-08-03 DIAGNOSIS — Z888 Allergy status to other drugs, medicaments and biological substances status: Secondary | ICD-10-CM

## 2022-08-03 DIAGNOSIS — I509 Heart failure, unspecified: Secondary | ICD-10-CM

## 2022-08-03 DIAGNOSIS — R1084 Generalized abdominal pain: Secondary | ICD-10-CM | POA: Diagnosis not present

## 2022-08-03 DIAGNOSIS — Z79899 Other long term (current) drug therapy: Secondary | ICD-10-CM

## 2022-08-03 DIAGNOSIS — I10 Essential (primary) hypertension: Secondary | ICD-10-CM | POA: Diagnosis present

## 2022-08-03 DIAGNOSIS — Z743 Need for continuous supervision: Secondary | ICD-10-CM | POA: Diagnosis not present

## 2022-08-03 DIAGNOSIS — Z9851 Tubal ligation status: Secondary | ICD-10-CM

## 2022-08-03 DIAGNOSIS — F419 Anxiety disorder, unspecified: Secondary | ICD-10-CM | POA: Diagnosis present

## 2022-08-03 DIAGNOSIS — I5032 Chronic diastolic (congestive) heart failure: Secondary | ICD-10-CM | POA: Diagnosis present

## 2022-08-03 DIAGNOSIS — Z91018 Allergy to other foods: Secondary | ICD-10-CM

## 2022-08-03 DIAGNOSIS — Z7951 Long term (current) use of inhaled steroids: Secondary | ICD-10-CM

## 2022-08-03 DIAGNOSIS — R55 Syncope and collapse: Secondary | ICD-10-CM | POA: Diagnosis not present

## 2022-08-03 DIAGNOSIS — Z87891 Personal history of nicotine dependence: Secondary | ICD-10-CM

## 2022-08-03 DIAGNOSIS — R4182 Altered mental status, unspecified: Secondary | ICD-10-CM | POA: Diagnosis not present

## 2022-08-03 DIAGNOSIS — F0154 Vascular dementia, unspecified severity, with anxiety: Secondary | ICD-10-CM | POA: Diagnosis present

## 2022-08-03 DIAGNOSIS — E876 Hypokalemia: Secondary | ICD-10-CM | POA: Diagnosis not present

## 2022-08-03 DIAGNOSIS — N1831 Chronic kidney disease, stage 3a: Secondary | ICD-10-CM | POA: Diagnosis present

## 2022-08-03 DIAGNOSIS — K219 Gastro-esophageal reflux disease without esophagitis: Secondary | ICD-10-CM | POA: Diagnosis present

## 2022-08-03 DIAGNOSIS — F0284 Dementia in other diseases classified elsewhere, unspecified severity, with anxiety: Secondary | ICD-10-CM | POA: Diagnosis present

## 2022-08-03 HISTORY — DX: Chronic obstructive pulmonary disease, unspecified: J44.9

## 2022-08-03 LAB — CBC
HCT: 32.6 % — ABNORMAL LOW (ref 36.0–46.0)
HCT: 31 % — ABNORMAL LOW (ref 36.0–46.0)
Hemoglobin: 10.2 g/dL — ABNORMAL LOW (ref 12.0–15.0)
Hemoglobin: 9.9 g/dL — ABNORMAL LOW (ref 12.0–15.0)
MCH: 25.4 pg — ABNORMAL LOW (ref 26.0–34.0)
MCH: 25.9 pg — ABNORMAL LOW (ref 26.0–34.0)
MCHC: 31.3 g/dL (ref 30.0–36.0)
MCHC: 31.9 g/dL (ref 30.0–36.0)
MCV: 81.3 fL (ref 80.0–100.0)
MCV: 81.2 fL (ref 80.0–100.0)
Platelets: 156 10*3/uL (ref 150–400)
Platelets: 206 10*3/uL (ref 150–400)
RBC: 4.01 MIL/uL (ref 3.87–5.11)
RBC: 3.82 MIL/uL — ABNORMAL LOW (ref 3.87–5.11)
RDW: 16 % — ABNORMAL HIGH (ref 11.5–15.5)
RDW: 16.1 % — ABNORMAL HIGH (ref 11.5–15.5)
WBC: 6.7 10*3/uL (ref 4.0–10.5)
WBC: 5.6 10*3/uL (ref 4.0–10.5)
nRBC: 0 % (ref 0.0–0.2)
nRBC: 0 % (ref 0.0–0.2)

## 2022-08-03 LAB — BASIC METABOLIC PANEL
Anion gap: 11 (ref 5–15)
BUN: 18 mg/dL (ref 8–23)
CO2: 25 mmol/L (ref 22–32)
Calcium: 8.2 mg/dL — ABNORMAL LOW (ref 8.9–10.3)
Chloride: 102 mmol/L (ref 98–111)
Creatinine, Ser: 0.95 mg/dL (ref 0.44–1.00)
GFR, Estimated: >60 mL/min (ref >60)
Glucose, Bld: 96 mg/dL (ref 70–99)
Potassium: 2.6 mmol/L — CL (ref 3.5–5.1)
Sodium: 138 mmol/L (ref 135–145)

## 2022-08-03 LAB — MAGNESIUM: Magnesium: 1.9 mg/dL (ref 1.7–2.4)

## 2022-08-03 LAB — TSH: TSH: 1.377 u[IU]/mL (ref 0.350–4.500)

## 2022-08-03 LAB — CREATININE, SERUM
Creatinine, Ser: 0.83 mg/dL (ref 0.44–1.00)
GFR, Estimated: >60 mL/min (ref >60)

## 2022-08-03 LAB — TROPONIN I (HIGH SENSITIVITY): Troponin I (High Sensitivity): 4 ng/L (ref <18)

## 2022-08-03 MED ORDER — DIPHENHYDRAMINE HCL 50 MG/ML IJ SOLN
12.5000 mg | Freq: Once | INTRAMUSCULAR | Status: AC
  Administered 2022-08-03: 12.5 mg via INTRAVENOUS
  Filled 2022-08-03: qty 1

## 2022-08-03 MED ORDER — MORPHINE SULFATE (PF) 2 MG/ML IV SOLN
2.0000 mg | INTRAVENOUS | Status: DC | PRN
  Administered 2022-08-03 – 2022-08-06 (×14): 2 mg via INTRAVENOUS
  Filled 2022-08-03 (×15): qty 1

## 2022-08-03 MED ORDER — SODIUM CHLORIDE 0.9 % IV BOLUS
500.0000 mL | Freq: Once | INTRAVENOUS | Status: AC
  Administered 2022-08-03: 500 mL via INTRAVENOUS

## 2022-08-03 MED ORDER — ACETAMINOPHEN 325 MG PO TABS
650.0000 mg | ORAL_TABLET | Freq: Four times a day (QID) | ORAL | Status: DC | PRN
  Filled 2022-08-03: qty 2

## 2022-08-03 MED ORDER — ONDANSETRON HCL 4 MG PO TABS: 4.0000 mg | ORAL_TABLET | Freq: Four times a day (QID) | ORAL | Status: DC | PRN

## 2022-08-03 MED ORDER — SODIUM CHLORIDE 0.9 % IV SOLN: INTRAVENOUS | Status: DC

## 2022-08-03 MED ORDER — IOHEXOL 300 MG/ML  SOLN
100.0000 mL | Freq: Once | INTRAMUSCULAR | Status: AC | PRN
  Administered 2022-08-03: 100 mL via INTRAVENOUS

## 2022-08-03 MED ORDER — ENOXAPARIN SODIUM 40 MG/0.4ML IJ SOSY
40.0000 mg | PREFILLED_SYRINGE | INTRAMUSCULAR | Status: DC
  Administered 2022-08-03 – 2022-08-05 (×3): 40 mg via SUBCUTANEOUS
  Filled 2022-08-03 (×3): qty 0.4

## 2022-08-03 MED ORDER — POTASSIUM CHLORIDE 10 MEQ/100ML IV SOLN
10.0000 meq | INTRAVENOUS | Status: AC
  Administered 2022-08-03 (×4): 10 meq via INTRAVENOUS
  Filled 2022-08-03 (×4): qty 100

## 2022-08-03 MED ORDER — SODIUM CHLORIDE 0.9% FLUSH
3.0000 mL | Freq: Two times a day (BID) | INTRAVENOUS | Status: DC
  Administered 2022-08-04 – 2022-08-06 (×4): 3 mL via INTRAVENOUS

## 2022-08-03 MED ORDER — PROCHLORPERAZINE EDISYLATE 10 MG/2ML IJ SOLN
10.0000 mg | Freq: Once | INTRAMUSCULAR | Status: AC
  Administered 2022-08-03: 10 mg via INTRAVENOUS
  Filled 2022-08-03: qty 2

## 2022-08-03 MED ORDER — ONDANSETRON HCL 4 MG/2ML IJ SOLN
4.0000 mg | Freq: Four times a day (QID) | INTRAMUSCULAR | Status: DC | PRN
  Administered 2022-08-03 – 2022-08-06 (×7): 4 mg via INTRAVENOUS
  Filled 2022-08-03 (×7): qty 2

## 2022-08-03 MED ORDER — ACETAMINOPHEN 650 MG RE SUPP: 650.0000 mg | Freq: Four times a day (QID) | RECTAL | Status: DC | PRN

## 2022-08-03 NOTE — ED Notes (Signed)
Pt brought to ED rm 36, this RN now assuming care.

## 2022-08-03 NOTE — H&P (Signed)
History and Physical    Patient: Morgan [name] YTK:[id number] DOB: 1951/08/15 DOA: 08/03/2022 DOS: the patient was seen and examined on 08/03/2022 PCP: [name], MD  Patient coming from: {Point_of_Origin:[address]}  Chief Complaint:  Chief Complaint  Patient presents with   Loss of Consciousness   HPI: Morgan [name] is a 71 y.o. female with medical history significant of ***  Review of Systems: {ROS_Text:26778} Past Medical History:  Diagnosis Date   Allergy    Anemia    Anxiety    Arthritis    CHF (congestive heart failure) (HCC)    COPD (chronic obstructive pulmonary disease) ([place])    Depression    GERD (gastroesophageal reflux disease)    Hypertension    MVP (mitral valve prolapse)    Parkinson disease ([place])    Past Surgical History:  Procedure Laterality Date   ABDOMINAL HYSTERECTOMY     APPENDECTOMY     BALLOON DILATION N/A 04/21/2020   Procedure: BALLOON DILATION;  Surgeon: Milus Banister, MD;  Location: [facility];  Service: Endoscopy;  Laterality: N/A;  pyloric/ GI   BALLOON DILATION N/A 07/04/2020   Procedure: BALLOON DILATION;  Surgeon: Mauri Pole, MD;  Location: [facility];  Service: Endoscopy;  Laterality: N/A;   BIOPSY  04/21/2020   Procedure: BIOPSY;  Surgeon: Milus Banister, MD;  Location: [facility];  Service: Endoscopy;;   BIOPSY  07/04/2020   Procedure: BIOPSY;  Surgeon: Mauri Pole, MD;  Location: [facility];  Service: Endoscopy;;  EGD and COLON   BREAST SURGERY Right 1988   tumor removal   CHOLECYSTECTOMY N/A 05/18/2020   Procedure: LAPAROSCOPIC CHOLECYSTECTOMY;  Surgeon: Johnathan Hausen, MD;  Location: [facility];  Service: General;  Laterality: N/A;   COLONOSCOPY WITH PROPOFOL N/A 07/04/2020   Procedure: COLONOSCOPY WITH PROPOFOL;  Surgeon: Mauri Pole, MD;  Location: [facility];  Service: Endoscopy;  Laterality: N/A;   COLONOSCOPY WITH PROPOFOL N/A 11/03/2021   Procedure: COLONOSCOPY WITH PROPOFOL;  Surgeon: Lesly Rubenstein, MD;  Location: [facility];  Service: Endoscopy;  Laterality: N/A;   ESOPHAGEAL DILATION  08/11/2020   Procedure: PYLORIC DILATION;  Surgeon: Doran Stabler, MD;  Location: [facility];  Service: Gastroenterology;;   ESOPHAGOGASTRODUODENOSCOPY (EGD) WITH PROPOFOL N/A 04/21/2020   Procedure: ESOPHAGOGASTRODUODENOSCOPY (EGD) WITH PROPOFOL;  Surgeon: Milus Banister, MD;  [facility];  Service: Endoscopy;  Laterality: N/A;   ESOPHAGOGASTRODUODENOSCOPY (EGD) WITH PROPOFOL N/A 07/04/2020   Procedure: ESOPHAGOGASTRODUODENOSCOPY (EGD) WITH PROPOFOL;  Surgeon: Mauri Pole, MD;  [facility];  Service: Endoscopy;  Laterality: N/A;   ESOPHAGOGASTRODUODENOSCOPY (EGD) WITH PROPOFOL N/A 08/11/2020   Procedure: ESOPHAGOGASTRODUODENOSCOPY (EGD) WITH PROPOFOL;  Surgeon: Doran Stabler, MD;  [facility];  Service: Gastroenterology;  Laterality: N/A;   ESOPHAGOGASTRODUODENOSCOPY (EGD) WITH PROPOFOL N/A 11/03/2021   Procedure: ESOPHAGOGASTRODUODENOSCOPY (EGD) WITH PROPOFOL;  Surgeon: Lesly Rubenstein, MD;  Location: [facility];  Service: Endoscopy;  Laterality: N/A;   ESOPHAGOGASTRODUODENOSCOPY (EGD) WITH PROPOFOL N/A 05/07/2022   Procedure: ESOPHAGOGASTRODUODENOSCOPY (EGD) WITH PROPOFOL;  Surgeon: Lin Landsman, MD;  Location: [facility] END[facility]COPY;  Service: Gastroenterology;  Laterality: N/A;   KNEE SURGERY  2005   2 times left   KNEE SURGERY Right    KYPHOPLASTY N/A 07/20/2021   Procedure: T 10KYPHOPLASTY;  Surgeon: Hessie Knows, MD;  Location: [facility];  Service: Orthopedics;  Laterality: N/A;   KYPHOPLASTY N/A 08/23/2021   Procedure: T9 KYPHOPLASTY;  Surgeon: Hessie Knows, MD;  Location: [facility];  Service: Orthopedics;  Laterality: N/A;   POLYPECTOMY  07/04/2020   Procedure: POLYPECTOMY;  Surgeon: Mauri Pole, MD;  [facility];  Service: Endoscopy;;   TUBAL LIGATION     Social History:  reports that she quit smoking about 4  years ago. Her smoking use included cigarettes. She has a 15.00 pack-year smoking history. She has never used smokeless tobacco. She reports that she does not currently use alcohol. She reports that she does not use drugs.  Allergies  Allergen Reactions   Meperidine Hives and Anaphylaxis   Strawberry Extract Hives and Swelling   Sucralfate Nausea Only and Other (See Comments)    Severe nausea Other reaction(s): Other (See Comments) Severe nausea Severe nausea   Wasp Venom Hives    Family History  Problem Relation Age of Onset   Breast cancer Mother 25   Ovarian cancer Mother 30   Brain cancer Mother    Hypertension Father    Hyperlipidemia Father    Heart disease Father    Colon cancer Father        diagnosed in his 40s   Breast cancer Maternal Grandmother 46   Diabetes Paternal Grandmother    Breast cancer Paternal Grandmother 11   Other Son        drug over dose   Colon cancer Maternal Grandfather        dx in his 5s   Liver disease Maternal Grandfather    Breast cancer Maternal Aunt 60   Breast cancer Maternal Aunt 60   Colon cancer Maternal Uncle    Stomach cancer Neg Hx    Pancreatic cancer Neg Hx    Esophageal cancer Neg Hx     Prior to Admission medications   Medication Sig Start Date End Date Taking? Authorizing Provider  acetaminophen (TYLENOL) 325 MG tablet Take 2 tablets (650 mg total) by mouth every 4 (four) hours as needed for mild pain (or temp > 37.5 C (99.5 F)). 01/30/22   Sharen Hones, MD  AIMOVIG 140 MG/ML SOAJ SMARTSIG:140 Milligram(s) SUB-Q Once a Month 02/21/22   [provider]  aspirin-acetaminophen-caffeine (EXCEDRIN MIGRAINE) [phone] MG tablet Take 1-2 tablets by mouth every 6 (six) hours as needed for headache.    [provider]  Budeson-Glycopyrrol-Formoterol (BREZTRI AEROSPHERE) 160-9-4.8 MCG/ACT AERO Inhale 2 puffs into the lungs in the morning and at bedtime. 04/13/22   Hunsucker, Bonna Gains, MD  carbidopa-levodopa (SINEMET  IR) 25-100 MG tablet Take 2 tablets by mouth 3 (three) times daily. 11/21/21   [provider]  Cholecalciferol 1.25 MG (50000 UT) TABS Take 1 tablet by mouth once a week. 02/20/22   [name], MD  diphenhydrAMINE (BENADRYL) 50 MG/ML injection Inject 0.25 mLs (12.5 mg total) into the vein every 8 (eight) hours as needed for up to 5 days (nausea vomiting). 05/10/22 05/15/22  Nolberto Hanlon, MD  KLOR-CON M20 20 MEQ tablet TAKE 1 TABLET BY MOUTH EVERY DAY 06/20/22   [name], MD  methylphenidate (RITALIN) 5 MG tablet Take by mouth.    [provider]  metoCLOPramide (REGLAN) 5 MG tablet Take 1 tablet (5 mg total) by mouth 4 (four) times daily -  before meals and at bedtime. 07/13/22   [name], MD  montelukast (SINGULAIR) 10 MG tablet Take 1 tablet (10 mg total) by mouth at bedtime. 04/13/22   Hunsucker, Bonna Gains, MD  OXYGEN Inhale 3 L/min into the lungs See admin instructions. 3 L/min at bedtime and as needed  for shortness of breath throughout the day    [provider]  pantoprazole (PROTONIX) 40 MG tablet Take 1 tablet (40 mg total) by mouth 2 (two) times daily before a meal. 05/10/22 07/20/23  Nolberto Hanlon, MD  scopolamine (TRANSDERM-SCOP) 1 MG/3DAYS Place 1 patch (1.5 mg total) onto the skin every 3 (three) days. 07/19/22   [name], MD    Physical Exam: Vitals:   08/03/22 1810 08/03/22 1830 08/03/22 1930 08/03/22 1941  BP: 130/68 127/71 (!) 149/87   Pulse: 74 71 70   Resp: '18 17 14   '$ Temp:    97.6 F (36.4 C)  TempSrc:    Oral  SpO2: 100% 100% 100%   Weight:      Height:       *** Data Reviewed: {Tip this will not be part of the note when signed- Document your independent interpretation of telemetry tracing, EKG, lab, Radiology test or any other diagnostic tests. Add any new diagnostic test ordered today. (Optional):26781} {Results:26384}  Assessment and Plan: No notes have been filed under this hospital service. Service: Hospitalist      Advance Care Planning:   Code Status: Prior ***  Consults: ***  Family Communication: ***  Severity of Illness: {Observation/Inpatient:21159}  AuthorBarbette Merino, MD 08/03/2022 8:27 PM  For on call review [email or web address].

## 2022-08-03 NOTE — ED Triage Notes (Signed)
Pt here via [facility] from home with a syncopal episode. Pt has a HA, N/V. Pt has a scopolamine patch but had several syncopal episodes today. EKG abnormal with ems.   138/80 100% 4L 74 137-cbg 22G R hand-300 ml of NS

## 2022-08-03 NOTE — Telephone Encounter (Signed)
[facility] - Client TELEPHONE ADVICE RECORD [facility] Patient Name: Morgan [name] Gender: Female DOB: [birth year] Age: 71 Y 38 M 13 D Return Phone Number: [phone] (Primary) Address: City/ State/ Zip: [place]  [address] Client [facility] - Client Client Site [facility] - Day Provider [name]- MD Contact Type Call Who Is Calling Patient / Member / Family / Caregiver Call Type Triage / Clinical Relationship To Patient Self Return Phone Number [phone] (Primary) Chief Complaint Dizziness Reason for Call Symptomatic / Request for [place] states that every time she stands up and feels like she will pass out. She is shaky, dizzy, and sick to her stomach. Translation No Nurse Assessment Nurse: Ronnald Ramp, RN, Sasha Date/Time (Eastern Time): 08/03/2022 12:53:51 PM Confirm and document reason for call. If symptomatic, describe symptoms. ---Caller states that every time she stands up and feels like she will pass out. She is shaky, dizzy, and sick to her stomach. Started yesterday, and has got worse. Does the patient have any new or worsening symptoms? ---Yes Will a triage be completed? ---Yes Related visit to physician within the last 2 weeks? ---No Does the PT have any chronic conditions? (i.e. diabetes, asthma, this includes High risk factors for pregnancy, etc.) ---No Is this a behavioral health or substance abuse call? ---No Guidelines Guideline Title Affirmed Question Affirmed Notes Nurse Date/Time Eilene Ghazi Time) Dizziness - Lightheadedness Difficulty breathing Ronnald Ramp, RN, Sasha 08/03/2022 12:55:07 PM Disp. Time Eilene Ghazi Time) Disposition Final User 08/03/2022 12:57:14 PM Go to ED Now Yes Ronnald Ramp, RN, Sasha Final Disposition 08/03/2022 12:57:14 PM Go to ED Now Yes Ronnald Ramp, RN, Sasha PLEASE NOTE: All timestamps contained within this report are  represented as [place] Standard Time. CONFIDENTIALTY NOTICE: This fax transmission is intended only for the addressee. It contains information that is legally privileged, confidential or otherwise protected from use or disclosure. If you are not the intended recipient, you are strictly prohibited from reviewing, disclosing, copying using or disseminating any of this information or taking any action in reliance on or regarding this information. If you have received this fax in error, please notify us immediately by telephone so that we can arrange for its return to [place]. Phone: [phone], Toll-Free: [phone], Fax: [phone] Page: 2 of 2 Call [name]: [phone] [place] Disagree/Comply Comply Caller Understands Yes PreDisposition InappropriateToAsk Care Advice Given Per Guideline GO TO ED NOW: * You need to be seen in the Emergency Department. * Go to the ED at ___________ [place] now. Drive carefully. NOTE TO TRIAGER - DRIVING: * Another adult should drive. * Patient should not delay going to the emergency department. * If immediate transportation is not available via car, rideshare (e.g., Lyft, Uber), or taxi, then the patient should be instructed to call EMS-911. CARE ADVICE given per Dizziness (Adult) guideline. BRING MEDICINES: * Bring a list of your current medicines when you go to the Emergency Department (ER). * Bring the pill bottles too. This will help the doctor (or NP/PA) to make certain you are taking the right medicines and the right dose. Referrals GO TO FACILITY UNDECIDE

## 2022-08-03 NOTE — Telephone Encounter (Signed)
Per chart review tab pt is presently at [facility] ED. Sending note to Dr Einar Pheasant as Juluis Rainier to PCP who is out of office.

## 2022-08-03 NOTE — ED Provider Notes (Addendum)
[facility] Provider Note    Event Date/Time   First MD Initiated Contact with Patient 08/03/22 1829     (approximate)   History   Loss of Consciousness   HPI  Morgan [name] is a 71 y.o. 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  Also complaining of headache.  Has had similar headaches in the past.  Has had similar episodes in the past.  She denies any chest pain or worsening shortness of breath at this time.  Denies any numbness or tingling.     Physical Exam   Triage Vital Signs: ED Triage Vitals  Enc Vitals Group     BP 08/03/22 1548 (!) 147/95     Pulse Rate 08/03/22 1548 74     Resp 08/03/22 1548 (!) 22     Temp 08/03/22 1548 98.1 F (36.7 C)     Temp Source 08/03/22 1548 Oral     SpO2 08/03/22 1548 100 %     Weight 08/03/22 1549 198 lb (89.8 kg)     Height 08/03/22 1549 '6\' 1"'$  (1.854 m)     Head Circumference --      Peak Flow --      Pain Score 08/03/22 1549 10     Pain Loc --      Pain Edu? --      Excl. in [place]? --     Most recent vital signs: Vitals:   08/03/22 2030 08/03/22 2337  BP: (!) 152/92 (!) 148/89  Pulse: 73 74  Resp: 12 16  Temp:  98 F (36.7 C)  SpO2: 100% 100%     Constitutional: Alert chronically ill appearing Eyes: Conjunctivae are normal.  Head: Atraumatic. Nose: No congestion/rhinnorhea. Mouth/Throat: Mucous membranes are dry Neck: Painless ROM.  Cardiovascular:   Good peripheral circulation. Respiratory: Normal respiratory effort.  No retractions, scattered wheeze throughout Gastrointestinal: Soft and nontender.  Musculoskeletal:  no deformity Neurologic:  MAE spontaneously, No gross focal neurologic deficits are appreciated.  Skin:  Skin is warm, dry and intact. No rash noted. Psychiatric: calm and cooperative    ED Results / Procedures / Treatments   Labs (all labs ordered are  listed, but only abnormal results are displayed) Labs Reviewed  BASIC METABOLIC PANEL - Abnormal; Notable for the following components:      Result Value   Potassium 2.6 (*)    Calcium 8.2 (*)    All other components within normal limits  CBC - Abnormal; Notable for the following components:   Hemoglobin 10.2 (*)    HCT 32.6 (*)    MCH 25.4 (*)    RDW 16.0 (*)    All other components within normal limits  CBC - Abnormal; Notable for the following components:   RBC 3.82 (*)    Hemoglobin 9.9 (*)    HCT 31.0 (*)    MCH 25.9 (*)    RDW 16.1 (*)    All other components within normal limits  MAGNESIUM  CREATININE, SERUM  TSH  URINALYSIS, ROUTINE W REFLEX MICROSCOPIC  COMPREHENSIVE METABOLIC PANEL  CBC  TROPONIN I (HIGH SENSITIVITY)     EKG  ED ECG REPORT I, Merlyn Lot, the attending physician, personally viewed and interpreted this ECG.   Date: 08/04/2022  EKG Time: 15:59  Rate: 75  Rhythm: sinus  Axis: normal  Intervals: normal  ST&T Change: no stemi, no  depression, u wave    RADIOLOGY Please see ED Course for my review and interpretation.  I personally reviewed all radiographic images ordered to evaluate for the above acute complaints and reviewed radiology reports and findings.  These findings were personally discussed with the patient.  Please see medical record for radiology report.    PROCEDURES:  Critical Care performed: Yes, see critical care procedure note(s)  .Critical Care  Performed by: Merlyn Lot, MD Authorized by: Merlyn Lot, MD   Critical care provider statement:    Critical care time (minutes):  35   Critical care was necessary to treat or prevent imminent or life-threatening deterioration of the following conditions:  Metabolic crisis   Critical care was time spent personally by me on the following activities:  Ordering and performing treatments and interventions, ordering and review of laboratory studies, ordering and  review of radiographic studies, pulse oximetry, re-evaluation of patient's condition, review of old charts, obtaining history from patient or surrogate, examination of patient, evaluation of patient's response to treatment, discussions with primary provider, discussions with consultants and development of treatment plan with patient or surrogate    MEDICATIONS ORDERED IN ED: Medications  potassium chloride 10 mEq in 100 mL IVPB (10 mEq Intravenous New Bag/Given 08/03/22 2344)  sodium chloride flush (NS) 0.9 % injection 3 mL (3 mLs Intravenous Not Given 08/03/22 2105)  enoxaparin (LOVENOX) injection 40 mg (40 mg Subcutaneous Given 08/03/22 2055)  0.9 %  sodium chloride infusion ( Intravenous New Bag/Given 08/03/22 2228)  acetaminophen (TYLENOL) tablet 650 mg (has no administration in time range)    Or  acetaminophen (TYLENOL) suppository 650 mg (has no administration in time range)  ondansetron (ZOFRAN) tablet 4 mg ( Oral See Alternative 08/03/22 2053)    Or  ondansetron (ZOFRAN) injection 4 mg (4 mg Intravenous Given 08/03/22 2053)  morphine (PF) 2 MG/ML injection 2 mg (2 mg Intravenous Given 08/03/22 2154)  sodium chloride 0.9 % bolus 500 mL (0 mLs Intravenous Stopped 08/03/22 2225)  prochlorperazine (COMPAZINE) injection 10 mg (10 mg Intravenous Given 08/03/22 1919)  diphenhydrAMINE (BENADRYL) injection 12.5 mg (12.5 mg Intravenous Given 08/03/22 1918)  iohexol (OMNIPAQUE) 300 MG/ML solution 100 mL (100 mLs Intravenous Contrast Given 08/03/22 1851)     IMPRESSION / MDM / [place] / ED COURSE  I reviewed the triage vital signs and the nursing notes.                              Differential diagnosis includes, but is not limited to, dehydration, dysrhythmia, anemia, ACS, CVA, tension, SAH  Patient into the ER for evaluation of symptoms as described above.  This presenting complaint could reflect a potentially life-threatening illness therefore the patient will be placed on continuous  pulse oximetry and telemetry for monitoring.  Laboratory evaluation will be sent to evaluate for the above complaints.  She is chronically ill-appearing.  Blood work shows evidence of acute hypokalemia.  Does have U waves on EKG.  Seems less consistent with ACS.  CT imaging ordered for the above differential my review and interpretation does not show any evidence of bleed.  Does not seem consistent with SAH.  Not meningitic.  Based on her age and risk factors do feel that admission to the hospital is clinically indicated given her syncope.  I have ordered repletion of her potassium both IV and p.o.  Have consulted with hospitalist who agreed admit patient for further medical  management.        FINAL CLINICAL IMPRESSION(S) / ED DIAGNOSES   Final diagnoses:  Syncope, unspecified syncope type  Hypokalemia     Rx / DC Orders   ED Discharge Orders     None        Note:  This document was prepared using Dragon voice recognition software and may include unintentional dictation errors.    Merlyn Lot, MD 08/04/22 Nolberto Hanlon    Merlyn Lot, MD 08/04/22 [phone]

## 2022-08-03 NOTE — Telephone Encounter (Signed)
Patient called in stating that every time she stands up, she feel like she is going to pass out. She stated that she has been sick to her stomach, dizzy, and shaky. Sent over to triage.

## 2022-08-03 NOTE — ED Triage Notes (Signed)
Patient arrived by EMS. Reports she had been at the grocery store with family and started feeling bad with headache and nausea. Reports LOC on the way home in passenger seat and family called EMS. Unknown time of LOC. Patient reports she wears scopolamine patches due to nausea X4 months. Currently being tested for multiple myeloma.   Patient A&O x4 during triage but appears to not feel well. Holding head and c/o nausea. Reports she has zofran at home but does not help relieve nausea.   Has DNR present in chart  Wears 4L O2 continuous. Hx COPD

## 2022-08-03 NOTE — ED Notes (Signed)
Dr Garba at bedside 

## 2022-08-04 DIAGNOSIS — R69 Illness, unspecified: Secondary | ICD-10-CM | POA: Diagnosis not present

## 2022-08-04 DIAGNOSIS — Z90710 Acquired absence of both cervix and uterus: Secondary | ICD-10-CM | POA: Diagnosis not present

## 2022-08-04 DIAGNOSIS — G20 Parkinson's disease: Secondary | ICD-10-CM | POA: Diagnosis not present

## 2022-08-04 DIAGNOSIS — Z9851 Tubal ligation status: Secondary | ICD-10-CM | POA: Diagnosis not present

## 2022-08-04 DIAGNOSIS — G43909 Migraine, unspecified, not intractable, without status migrainosus: Secondary | ICD-10-CM | POA: Diagnosis not present

## 2022-08-04 DIAGNOSIS — Z800 Family history of malignant neoplasm of digestive organs: Secondary | ICD-10-CM | POA: Diagnosis not present

## 2022-08-04 DIAGNOSIS — N1831 Chronic kidney disease, stage 3a: Secondary | ICD-10-CM | POA: Diagnosis not present

## 2022-08-04 DIAGNOSIS — E860 Dehydration: Secondary | ICD-10-CM | POA: Diagnosis not present

## 2022-08-04 DIAGNOSIS — Z87891 Personal history of nicotine dependence: Secondary | ICD-10-CM | POA: Diagnosis not present

## 2022-08-04 DIAGNOSIS — Z9049 Acquired absence of other specified parts of digestive tract: Secondary | ICD-10-CM | POA: Diagnosis not present

## 2022-08-04 DIAGNOSIS — Z79899 Other long term (current) drug therapy: Secondary | ICD-10-CM | POA: Diagnosis not present

## 2022-08-04 DIAGNOSIS — I130 Hypertensive heart and chronic kidney disease with heart failure and stage 1 through stage 4 chronic kidney disease, or unspecified chronic kidney disease: Secondary | ICD-10-CM | POA: Diagnosis not present

## 2022-08-04 DIAGNOSIS — E876 Hypokalemia: Secondary | ICD-10-CM | POA: Diagnosis not present

## 2022-08-04 DIAGNOSIS — Z888 Allergy status to other drugs, medicaments and biological substances status: Secondary | ICD-10-CM | POA: Diagnosis not present

## 2022-08-04 DIAGNOSIS — D649 Anemia, unspecified: Secondary | ICD-10-CM | POA: Diagnosis not present

## 2022-08-04 DIAGNOSIS — Z9981 Dependence on supplemental oxygen: Secondary | ICD-10-CM | POA: Diagnosis not present

## 2022-08-04 DIAGNOSIS — Z8249 Family history of ischemic heart disease and other diseases of the circulatory system: Secondary | ICD-10-CM | POA: Diagnosis not present

## 2022-08-04 DIAGNOSIS — I5032 Chronic diastolic (congestive) heart failure: Secondary | ICD-10-CM | POA: Diagnosis not present

## 2022-08-04 DIAGNOSIS — F0284 Dementia in other diseases classified elsewhere, unspecified severity, with anxiety: Secondary | ICD-10-CM | POA: Diagnosis present

## 2022-08-04 DIAGNOSIS — Z91038 Other insect allergy status: Secondary | ICD-10-CM | POA: Diagnosis not present

## 2022-08-04 DIAGNOSIS — K219 Gastro-esophageal reflux disease without esophagitis: Secondary | ICD-10-CM | POA: Diagnosis not present

## 2022-08-04 DIAGNOSIS — R55 Syncope and collapse: Secondary | ICD-10-CM | POA: Diagnosis not present

## 2022-08-04 DIAGNOSIS — J449 Chronic obstructive pulmonary disease, unspecified: Secondary | ICD-10-CM | POA: Diagnosis not present

## 2022-08-04 DIAGNOSIS — Z91018 Allergy to other foods: Secondary | ICD-10-CM | POA: Diagnosis not present

## 2022-08-04 DIAGNOSIS — F0154 Vascular dementia, unspecified severity, with anxiety: Secondary | ICD-10-CM | POA: Diagnosis present

## 2022-08-04 LAB — CBC
HCT: 31.8 % — ABNORMAL LOW (ref 36.0–46.0)
Hemoglobin: 10.1 g/dL — ABNORMAL LOW (ref 12.0–15.0)
MCH: 25.9 pg — ABNORMAL LOW (ref 26.0–34.0)
MCHC: 31.8 g/dL (ref 30.0–36.0)
MCV: 81.5 fL (ref 80.0–100.0)
Platelets: 205 10*3/uL (ref 150–400)
RBC: 3.9 MIL/uL (ref 3.87–5.11)
RDW: 15.8 % — ABNORMAL HIGH (ref 11.5–15.5)
WBC: 5.3 10*3/uL (ref 4.0–10.5)
nRBC: 0 % (ref 0.0–0.2)

## 2022-08-04 LAB — COMPREHENSIVE METABOLIC PANEL
ALT: <5 U/L (ref 0–44)
AST: 9 U/L — ABNORMAL LOW (ref 15–41)
Albumin: 2.7 g/dL — ABNORMAL LOW (ref 3.5–5.0)
Alkaline Phosphatase: 108 U/L (ref 38–126)
Anion gap: 5 (ref 5–15)
BUN: 14 mg/dL (ref 8–23)
CO2: 28 mmol/L (ref 22–32)
Calcium: 8.1 mg/dL — ABNORMAL LOW (ref 8.9–10.3)
Chloride: 106 mmol/L (ref 98–111)
Creatinine, Ser: 0.81 mg/dL (ref 0.44–1.00)
GFR, Estimated: >60 mL/min (ref >60)
Glucose, Bld: 86 mg/dL (ref 70–99)
Potassium: 3.1 mmol/L — ABNORMAL LOW (ref 3.5–5.1)
Sodium: 139 mmol/L (ref 135–145)
Total Bilirubin: 0.5 mg/dL (ref 0.3–1.2)
Total Protein: 5.3 g/dL — ABNORMAL LOW (ref 6.5–8.1)

## 2022-08-04 MED ORDER — POTASSIUM CHLORIDE CRYS ER 20 MEQ PO TBCR
40.0000 meq | EXTENDED_RELEASE_TABLET | Freq: Once | ORAL | Status: AC
  Administered 2022-08-04: 40 meq via ORAL
  Filled 2022-08-04: qty 2

## 2022-08-04 MED ORDER — BUTALBITAL-APAP-CAFFEINE 50-325-40 MG PO TABS
1.0000 | ORAL_TABLET | ORAL | Status: DC | PRN
  Administered 2022-08-04 – 2022-08-06 (×11): 1 via ORAL
  Filled 2022-08-04 (×12): qty 1

## 2022-08-04 NOTE — Progress Notes (Signed)
PROGRESS NOTE    Morgan [name]  DEY:[id number] DOB: 28-Oct-1951 DOA: 08/03/2022 PCP: [name], MD   Brief Narrative:  Morgan [name] is a 71 y.o. female with medical history significant of end-stage COPD, CHF, on chronic oxygen, diastolic CHF, GERD, essential hypertension, anxiety disorder, who presented with nausea and syncopal episode as well as dry heaving.  She was admitted for evaluation of syncopal episode and was also noted to have significant hypokalemia which is starting to improve.  She is currently having a migraine headache.  Assessment & Plan:   Principal Problem:   Syncope and collapse Active Problems:   Hypokalemia   Essential hypertension   Anxiety   GERD (gastroesophageal reflux disease)   CKD (chronic kidney disease) stage 3, GFR 30-59 ml/min (HCC)   CHF (congestive heart failure) (HCC)   Chronic diastolic CHF (congestive heart failure) (HCC)   COPD (chronic obstructive pulmonary disease) (HCC)   Vascular dementia, unspecified severity, with anxiety (HCC)   Anemia   Parkinson disease (HCC)  Assessment and Plan:   #1 syncope: Recurrent and has had it in the past.  Most likely due to dehydration especially with her hypokalemia.  Continue close monitoring.  PT/OT evaluation once migraine headache is resolved.   #2 significant hypokalemia: Replete potassium IV and orally. Recheck in am along with magnesium   #3 COPD: No acute exacerbation.  Continue to monitor.  Continue home regimen   #4 chronic kidney disease stage III: Continue to monitor renal function   #5 anxiety disorder: Confirm on resume home regimen.   6 GERD: Confirm resume PPIs   #7 Parkinson's disease: Confirm on resume home regimen.  #8 Migraine: Started on Fioricet to assist with symptoms, but currently unrelenting and patient is unable to eat or participate with PT and this is prohibiting her discharge today.  She does have migraines at home occasionally and follows with neurology for  this    DVT prophylaxis:Lovenox Code Status: Full Family Communication: None at bedside Disposition Plan:  Status is: Observation The patient will require care spanning > 2 midnights and should be moved to inpatient because: Need for IV fluids and ongoing symptoms.   Nutritional Assessment:  The patient's BMI is: Body mass index is 26.12 kg/m.Marland Kitchen  Seen by dietician.  I agree with the assessment and plan as outlined below:  Nutrition Status:      Consultants:  None  Procedures:  None  Antimicrobials:  None   Subjective: Patient seen and evaluated today with migraine headache noted this morning.  She still has not had much to eat and could not participate with PT given her headache.  Objective: Vitals:   08/04/22 0411 08/04/22 0500 08/04/22 0807 08/04/22 1130  BP: (!) 173/86  (!) 178/92 (!) 161/85  Pulse: 73     Resp: 18  18   Temp: 98 F (36.7 C)  97.9 F (36.6 C) 98 F (36.7 C)  TempSrc:   Oral   SpO2: 100%  100%   Weight:  89.8 kg    Height:        Intake/Output Summary (Last 24 hours) at 08/04/2022 1208 Last data filed at 08/04/2022 1045 Gross per 24 hour  Intake 100 ml  Output 600 ml  Net -500 ml   Filed Weights   08/03/22 1549 08/04/22 0500  Weight: 89.8 kg 89.8 kg    Examination:  General exam: Appears to be in moderate distress with ongoing migraine headache Respiratory system: Clear to auscultation. Respiratory effort  normal. Cardiovascular system: S1 & S2 heard, RRR.  Gastrointestinal system: Abdomen is soft Central nervous system: Alert and awake Extremities: No edema Skin: No significant lesions noted Psychiatry: Flat affect.    Data Reviewed: I have personally reviewed following labs and imaging studies  CBC: Recent Labs  Lab 08/03/22 1551 08/03/22 2058 08/04/22 0643  WBC 6.7 5.6 5.3  HGB 10.2* 9.9* 10.1*  HCT 32.6* 31.0* 31.8*  MCV 81.3 81.2 81.5  PLT 156 206 814   Basic Metabolic Panel: Recent Labs  Lab 08/03/22 1551  08/03/22 1838 08/03/22 2058 08/04/22 0643  NA 138  --   --  139  K 2.6*  --   --  3.1*  CL 102  --   --  106  CO2 25  --   --  28  GLUCOSE 96  --   --  86  BUN 18  --   --  14  CREATININE 0.95  --  0.83 0.81  CALCIUM 8.2*  --   --  8.1*  MG  --  1.9  --   --    GFR: Estimated Creatinine Clearance: 78 mL/min (by C-G formula based on SCr of 0.81 mg/dL). Liver Function Tests: Recent Labs  Lab 08/04/22 0643  AST 9*  ALT <5  ALKPHOS 108  BILITOT 0.5  PROT 5.3*  ALBUMIN 2.7*   No results for input(s): "LIPASE", "AMYLASE" in the last 168 hours. No results for input(s): "AMMONIA" in the last 168 hours. Coagulation Profile: No results for input(s): "INR", "PROTIME" in the last 168 hours. Cardiac Enzymes: No results for input(s): "CKTOTAL", "CKMB", "CKMBINDEX", "TROPONINI" in the last 168 hours. BNP (last 3 results) Recent Labs    08/29/21 0931  PROBNP 180.0*   HbA1C: No results for input(s): "HGBA1C" in the last 72 hours. CBG: No results for input(s): "GLUCAP" in the last 168 hours. Lipid Profile: No results for input(s): "CHOL", "HDL", "LDLCALC", "TRIG", "CHOLHDL", "LDLDIRECT" in the last 72 hours. Thyroid Function Tests: Recent Labs    08/03/22 2058  TSH 1.377   Anemia Panel: No results for input(s): "VITAMINB12", "FOLATE", "FERRITIN", "TIBC", "IRON", "RETICCTPCT" in the last 72 hours. Sepsis Labs: No results for input(s): "PROCALCITON", "LATICACIDVEN" in the last 168 hours.  No results found for this or any previous visit (from the past 240 hour(s)).       Radiology Studies: CT ABDOMEN PELVIS W CONTRAST  Result Date: 08/03/2022 CLINICAL DATA:  Headache and nausea with loss of consciousness. EXAM: CT ABDOMEN AND PELVIS WITH CONTRAST TECHNIQUE: Multidetector CT imaging of the abdomen and pelvis was performed using the standard protocol following bolus administration of intravenous contrast. RADIATION DOSE REDUCTION: This exam was performed according to the  departmental dose-optimization program which includes automated exposure control, adjustment of the mA and/or kV according to patient size and/or use of iterative reconstruction technique. CONTRAST:  115m OMNIPAQUE IOHEXOL 300 MG/ML  SOLN COMPARISON:  May 04, 2022 FINDINGS: Lower chest: No acute abnormality. Hepatobiliary: No focal liver abnormality is seen. Status post cholecystectomy. No biliary dilatation. Pancreas: Unremarkable. No pancreatic ductal dilatation or surrounding inflammatory changes. Spleen: Normal in size without focal abnormality. Adrenals/Urinary Tract: Adrenal glands are unremarkable. Kidneys are normal, without renal calculi, focal lesion, or hydronephrosis. Bladder is unremarkable. Stomach/Bowel: Stomach is within normal limits. The appendix is surgically absent. No evidence of bowel wall thickening, distention, or inflammatory changes. Noninflamed diverticula are seen throughout the sigmoid colon. Vascular/Lymphatic: Aortic atherosclerosis. No enlarged abdominal or pelvic lymph nodes. Reproductive: Status  post hysterectomy. No adnexal masses. Other: No abdominal wall hernia or abnormality. No abdominopelvic ascites. Musculoskeletal: Degenerative changes are seen throughout the lumbar spine with evidence of prior vertebroplasty noted within the visualized portion of the lower thoracic spine. IMPRESSION: 1. Evidence of prior cholecystectomy and hysterectomy. 2. Sigmoid diverticulosis. 3. Aortic atherosclerosis. Aortic Atherosclerosis (ICD10-I70.0). Electronically Signed   By: Virgina Norfolk M.D.   On: 08/03/2022 19:10   CT HEAD WO CONTRAST (5MM)  Result Date: 08/03/2022 CLINICAL DATA:  Headache and nausea with loss of consciousness. EXAM: CT HEAD WITHOUT CONTRAST TECHNIQUE: Contiguous axial images were obtained from the base of the skull through the vertex without intravenous contrast. RADIATION DOSE REDUCTION: This exam was performed according to the departmental dose-optimization  program which includes automated exposure control, adjustment of the mA and/or kV according to patient size and/or use of iterative reconstruction technique. COMPARISON:  January 19, 2022 FINDINGS: Brain: No evidence of acute infarction, hemorrhage, hydrocephalus, extra-axial collection or mass lesion/mass effect. Vascular: No hyperdense vessel or unexpected calcification. Skull: Normal. Negative for fracture or focal lesion. Sinuses/Orbits: No acute finding. Other: None. IMPRESSION: No acute intracranial process. Electronically Signed   By: Virgina Norfolk M.D.   On: 08/03/2022 19:04        Scheduled Meds:  enoxaparin (LOVENOX) injection  40 mg Subcutaneous Q24H   sodium chloride flush  3 mL Intravenous Q12H     LOS: 0 days    Time spent: 35 minutes    Jeremy Mclamb Darleen Crocker, DO [facility]  If 7PM-7AM, please contact night-coverage [email or web address] 08/04/2022, 12:08 PM

## 2022-08-04 NOTE — Progress Notes (Signed)
PT Cancellation Note  Patient Details Name: Morgan [name] MRN: [id number] DOB: 1951-05-09   Cancelled Treatment:    Reason Eval/Treat Not Completed: Pain limiting ability to participate.  Pt has a migraine per nsg, will reattempt at another time.   Morgan [name] 08/04/2022, 10:08 AM  Morgan [name], PT PhD Acute Rehab Dept. Number: [place] and [place]

## 2022-08-04 NOTE — Progress Notes (Signed)
PT Cancellation Note  Patient Details Name: Morgan [name] MRN: [id number] DOB: 11/29/1951   Cancelled Treatment:    Reason Eval/Treat Not Completed: Pain limiting ability to participate.  Pt is declining to get up due to ongoing HA migraine.  Follow up as time and pt allow.   Ramond Dial 08/04/2022, 12:46 PM  Mee Hives, PT PhD Acute Rehab Dept. Number: [place] and [place]

## 2022-08-05 DIAGNOSIS — R55 Syncope and collapse: Secondary | ICD-10-CM | POA: Diagnosis not present

## 2022-08-05 LAB — BASIC METABOLIC PANEL
Anion gap: 8 (ref 5–15)
BUN: 12 mg/dL (ref 8–23)
CO2: 31 mmol/L (ref 22–32)
Calcium: 8.6 mg/dL — ABNORMAL LOW (ref 8.9–10.3)
Chloride: 100 mmol/L (ref 98–111)
Creatinine, Ser: 0.76 mg/dL (ref 0.44–1.00)
GFR, Estimated: >60 mL/min (ref >60)
Glucose, Bld: 95 mg/dL (ref 70–99)
Potassium: 3.2 mmol/L — ABNORMAL LOW (ref 3.5–5.1)
Sodium: 139 mmol/L (ref 135–145)

## 2022-08-05 LAB — MAGNESIUM: Magnesium: 2 mg/dL (ref 1.7–2.4)

## 2022-08-05 LAB — GLUCOSE, CAPILLARY: Glucose-Capillary: 96 mg/dL (ref 70–99)

## 2022-08-05 MED ORDER — SODIUM CHLORIDE 0.9 % IV BOLUS
500.0000 mL | Freq: Once | INTRAVENOUS | Status: AC
Start: 2022-08-05 — End: 2022-08-05
  Administered 2022-08-05: 500 mL via INTRAVENOUS

## 2022-08-05 MED ORDER — DIVALPROEX SODIUM 500 MG PO DR TAB
500.0000 mg | DELAYED_RELEASE_TABLET | Freq: Once | ORAL | Status: AC
  Administered 2022-08-05: 500 mg via ORAL
  Filled 2022-08-05: qty 1

## 2022-08-05 MED ORDER — CARBIDOPA-LEVODOPA 25-100 MG PO TABS
2.0000 | ORAL_TABLET | Freq: Three times a day (TID) | ORAL | Status: DC
Start: 2022-08-05 — End: 2022-08-06
  Administered 2022-08-05 – 2022-08-06 (×4): 2 via ORAL
  Filled 2022-08-05 (×4): qty 2

## 2022-08-05 MED ORDER — POTASSIUM CHLORIDE CRYS ER 20 MEQ PO TBCR
40.0000 meq | EXTENDED_RELEASE_TABLET | Freq: Once | ORAL | Status: AC
  Administered 2022-08-05: 40 meq via ORAL
  Filled 2022-08-05: qty 2

## 2022-08-05 MED ORDER — PANTOPRAZOLE SODIUM 40 MG PO TBEC
40.0000 mg | DELAYED_RELEASE_TABLET | Freq: Two times a day (BID) | ORAL | Status: DC
  Administered 2022-08-05 – 2022-08-06 (×2): 40 mg via ORAL
  Filled 2022-08-05 (×2): qty 1

## 2022-08-05 MED ORDER — MONTELUKAST SODIUM 10 MG PO TABS
10.0000 mg | ORAL_TABLET | Freq: Every day | ORAL | Status: DC
  Administered 2022-08-05: 10 mg via ORAL
  Filled 2022-08-05: qty 1

## 2022-08-05 MED ORDER — ORAL CARE MOUTH RINSE: 15.0000 mL | OROMUCOSAL | Status: DC | PRN

## 2022-08-05 NOTE — Hospital Course (Signed)
Taken from prior notes.  Morgan [name] is a 71 y.o. female with medical history significant of end-stage COPD, CHF, on chronic oxygen, diastolic CHF, GERD, essential hypertension, anxiety disorder, who presented with nausea and syncopal episode as well as dry heaving.  She was admitted for evaluation of syncopal episode and was also noted to have significant hypokalemia which is being repleted.  8/20: Patient continued to have some dry heaving and postural dizziness.  Orthostatic vitals were positive.  Giving some IV fluid. Parkinson's disease can be contributory to orthostatic vitals.  8/21: Overnight had migraine headache and was given 1 dose of Depakote with some relief.  Worsening migraine this morning.  Giving her migraine cocktail which included Toradol, Compazine and Benadryl.  Labs seems stable.  We stopped home carvedilol due to borderline blood pressure. Patient need to be seen by her neurologist closely for further recommendations as orthostatic vitals can be due to Parkinson's.  Patient will continue with rest of her home medication and need to have close follow-up with her providers.

## 2022-08-05 NOTE — Assessment & Plan Note (Signed)
Creatinine seems stable.  History of CKD stage III A -Monitor renal function -Avoid nephrotoxins

## 2022-08-05 NOTE — Progress Notes (Signed)
  Progress Note   Patient: Morgan [name] CWU:[id number] DOB: 05-11-1951 DOA: 08/03/2022     1 DOS: the patient was seen and examined on 08/05/2022   Brief hospital course: Taken from prior notes.  Morgan [name] is a 71 y.o. female with medical history significant of end-stage COPD, CHF, on chronic oxygen, diastolic CHF, GERD, essential hypertension, anxiety disorder, who presented with nausea and syncopal episode as well as dry heaving.  She was admitted for evaluation of syncopal episode and was also noted to have significant hypokalemia which is being repleted.  8/20: Patient continued to have some dry heaving and postural dizziness.  Orthostatic vitals were positive.  Giving some IV fluid. Parkinson's disease can be contributory to orthostatic vitals.   Assessment and Plan: * Syncope and collapse History of recurrent similar episodes.  It was initially thought to be due to dehydration. Orthostatic vitals checked today and they were positive. -Giving some more IV fluid -Recheck orthostatic vitals tomorrow -Parkinson's can be playing a role  Hypokalemia Potassium of 3.2 with magnesium of 2. -Repeat potassium and monitor  Essential hypertension Blood pressure currently within goal. Holding home Imdur and carvedilol for concern of positive orthostatic vitals and dizziness. -Continue to monitor -Can resume home meds if needed  Parkinson disease ([place]) - Continue home Sinemet  CKD (chronic kidney disease) stage 3, GFR 30-59 ml/min (HCC) Creatinine seems stable.  History of CKD stage III A -Monitor renal function -Avoid nephrotoxins  COPD (chronic obstructive pulmonary disease) (HCC) No concern of exacerbation. Saturating well on baseline oxygen requirement of 3 L. -Continue with as needed dilators  GERD (gastroesophageal reflux disease) - Continue with PPI    Subjective: Patient was seen and examined today.  Feeling dizzy with change in position.  Wants to go home.  Patient  drinks a lot of iced tea.   Physical Exam: Vitals:   08/05/22 0531 08/05/22 0800 08/05/22 1148 08/05/22 1525  BP:  135/76 129/72 139/77  Pulse:  67    Resp:  '19 18 18  '$ Temp:  98.6 F (37 C) 98 F (36.7 C)   TempSrc:  Oral Oral   SpO2:  100% 100% 100%  Weight: 86.7 kg     Height:       General.  Well-developed lady, in no acute distress. Pulmonary.  Lungs clear bilaterally, normal respiratory effort. CV.  Regular rate and rhythm, no JVD, rub or murmur. Abdomen.  Soft, nontender, nondistended, BS positive. CNS.  Alert and oriented .  No focal neurologic deficit. Extremities.  No edema, no cyanosis, pulses intact and symmetrical. Psychiatry.  Judgment and insight appears normal.  Flat affect   Data Reviewed: Prior labs, images and notes reviewed  Family Communication:   Disposition: Status is: Inpatient Remains inpatient appropriate because: Severity of illness   Planned Discharge Destination: Home  DVT prophylaxis.  Lovenox Time spent: 45 minutes  This record has been created using Systems analyst. Errors have been sought and corrected,but may not always be located. Such creation errors do not reflect on the standard of care.  Author: Lorella Nimrod, MD 08/05/2022 4:29 PM  For on call review [email or web address].

## 2022-08-05 NOTE — Assessment & Plan Note (Signed)
Potassium of 3.2 with magnesium of 2. -Repeat potassium and monitor

## 2022-08-05 NOTE — Assessment & Plan Note (Signed)
-   Continue with PPI 

## 2022-08-05 NOTE — Assessment & Plan Note (Signed)
History of recurrent similar episodes.  It was initially thought to be due to dehydration. Orthostatic vitals checked today and they were positive. -Giving some more IV fluid -Recheck orthostatic vitals tomorrow -Parkinson's can be playing a role

## 2022-08-05 NOTE — Evaluation (Signed)
Physical Therapy Evaluation Patient Details Name: Morgan [name] MRN: [id number] DOB: 1951/06/09 Today's Date: 08/05/2022  History of Present Illness  Pt admitted for syncope and collapse with complaints of N/V. HIstory includes COPD (4L), CHF, GERD, HTN, and anxiety.  Clinical Impression  Pt is a pleasant 71 year old female who was admitted for syncope and collapse. Pt reports migraine this date, lights off in room with head covered in towel. Agreeable to perform orthostatics (+ in standing with symptoms). Pt performs bed mobility/transfers with independence, however was unable to ambulate due to dizziness/migraine/nausea. Pt reports she feels at baseline level regarding strength/mobility and it appears that she is more limited by medical limitations vs mobility restrictions. Pt does not require any further PT needs at this time. Pt will be dc in house and does not require follow up. RN aware. Will dc current orders. Messaged care team and pt safe to mobilize with RN staff once medically stable.   Orthostatic VS for the past 24 hrs:  BP- Lying Pulse- Lying BP- Sitting Pulse- Sitting BP- Standing at 0 minutes Pulse- Standing at 0 minutes  08/05/22 1044 149/88 72 (!) 149/94 87 126/84 92          Recommendations for follow up therapy are one component of a multi-disciplinary discharge planning process, led by the attending physician.  Recommendations may be updated based on patient status, additional functional criteria and insurance authorization.  Follow Up Recommendations No PT follow up      Assistance Recommended at Discharge Set up Supervision/Assistance  Patient can return home with the following  A little help with walking and/or transfers;A little help with bathing/dressing/bathroom;Help with stairs or ramp for entrance    Equipment Recommendations None recommended by PT  Recommendations for Other Services       Functional Status Assessment Patient has not had a recent decline in  their functional status     Precautions / Restrictions Precautions Precautions: Fall Precaution Comments: orthostatic + Restrictions Weight Bearing Restrictions: No      Mobility  Bed Mobility Overal bed mobility: Independent             General bed mobility comments: safe technique with ease of mobility. Orthostatics obtained    Transfers Overall transfer level: Independent Equipment used: None               General transfer comment: ease of mobility. No unsteadiness, however does become dizzy and nauseated further limiting additional mobility. Orthostatics obtained, placed in chart    Ambulation/Gait               General Gait Details: unable to tolerate due to dizziness/nausea  Stairs            Wheelchair Mobility    Modified Rankin (Stroke Patients Only)       Balance Overall balance assessment: Mild deficits observed, not formally tested                                           Pertinent Vitals/Pain Pain Assessment Pain Assessment: Faces Faces Pain Scale: Hurts whole lot Pain Location: headache Pain Descriptors / Indicators: Aching Pain Intervention(s): Limited activity within patient's tolerance    Home Living Family/patient expects to be discharged to:: Private residence Living Arrangements: Children (lives with daughter and SIL) Available Help at Discharge: Family;Available 24 hours/day Type of Home: House Home Access: Ramped  entrance       Home Layout: One level Home Equipment: Rollator (4 wheels);Cane - single point;Shower seat - built in;Grab bars - tub/shower;Grab bars - toilet      Prior Function Prior Level of Function : Independent/Modified Independent             Mobility Comments: reports she hadn't been using any AD recently ADLs Comments: Ind with ADLs     Hand Dominance        Extremity/Trunk Assessment   Upper Extremity Assessment Upper Extremity Assessment: Overall WFL for  tasks assessed    Lower Extremity Assessment Lower Extremity Assessment: Overall WFL for tasks assessed       Communication   Communication: No difficulties  Cognition Arousal/Alertness: Awake/alert Behavior During Therapy: WFL for tasks assessed/performed Overall Cognitive Status: Within Functional Limits for tasks assessed                                          General Comments      Exercises     Assessment/Plan    PT Assessment Patient does not need any further PT services  PT Problem List         PT Treatment Interventions      PT Goals (Current goals can be found in the Care Plan section)  Acute Rehab PT Goals Patient Stated Goal: to feel better PT Goal Formulation: All assessment and education complete, DC therapy Time For Goal Achievement: 08/05/22 Potential to Achieve Goals: Good    Frequency       Co-evaluation               AM-PAC PT "6 Clicks" Mobility  Outcome Measure Help needed turning from your back to your side while in a flat bed without using bedrails?: None Help needed moving from lying on your back to sitting on the side of a flat bed without using bedrails?: None Help needed moving to and from a bed to a chair (including a wheelchair)?: A Little Help needed standing up from a chair using your arms (e.g., wheelchair or bedside chair)?: A Little Help needed to walk in hospital room?: A Little Help needed climbing 3-5 steps with a railing? : A Little 6 Click Score: 20    End of Session Equipment Utilized During Treatment: Oxygen Activity Tolerance: Treatment limited secondary to medical complications (Comment) Patient left: in bed;with bed alarm set;with nursing/sitter in room Nurse Communication: Mobility status PT Visit Diagnosis: Difficulty in walking, not elsewhere classified (R26.2);Dizziness and giddiness (R42)    Time: 2993-7169 PT Time Calculation (min) (ACUTE ONLY): 17 min   Charges:   PT  Evaluation $PT Eval Low Complexity: 1 Low          Greggory Stallion, PT, DPT, GCS [phone]   Morgan [name] 08/05/2022, 12:06 PM

## 2022-08-05 NOTE — Assessment & Plan Note (Addendum)
No concern of exacerbation. Saturating well on baseline oxygen requirement of 3 L. -Continue with as needed dilators

## 2022-08-05 NOTE — Assessment & Plan Note (Signed)
-   Continue home Sinemet

## 2022-08-05 NOTE — Progress Notes (Signed)
       CROSS COVER NOTE  NAME: Morgan [name] MRN: [id number] DOB : 02-01-1951    Date of Service   08/05/22  HPI/Events of Note   Message received from nursing reporting intractable migraines for a few days refractory to morphine and fioricet. On chart review [name] has received Depacon in the past with some relief.  Interventions   Plan: Depakote 500 mg DR PO x1     This document was prepared using Dragon voice recognition software and may include unintentional dictation errors.  Neomia Glass DNP, MHA, FNP-BC Nurse Practitioner [facility] Pager [phone]

## 2022-08-05 NOTE — Assessment & Plan Note (Signed)
Blood pressure currently within goal. Holding home Imdur and carvedilol for concern of positive orthostatic vitals and dizziness. -Continue to monitor -Can resume home meds if needed

## 2022-08-06 DIAGNOSIS — R55 Syncope and collapse: Secondary | ICD-10-CM | POA: Diagnosis not present

## 2022-08-06 LAB — BASIC METABOLIC PANEL
Anion gap: 7 (ref 5–15)
BUN: 10 mg/dL (ref 8–23)
CO2: 31 mmol/L (ref 22–32)
Calcium: 8.5 mg/dL — ABNORMAL LOW (ref 8.9–10.3)
Chloride: 101 mmol/L (ref 98–111)
Creatinine, Ser: 0.72 mg/dL (ref 0.44–1.00)
GFR, Estimated: >60 mL/min (ref >60)
Glucose, Bld: 93 mg/dL (ref 70–99)
Potassium: 3.7 mmol/L (ref 3.5–5.1)
Sodium: 139 mmol/L (ref 135–145)

## 2022-08-06 LAB — GLUCOSE, CAPILLARY: Glucose-Capillary: 87 mg/dL (ref 70–99)

## 2022-08-06 MED ORDER — DIPHENHYDRAMINE HCL 50 MG/ML IJ SOLN
25.0000 mg | Freq: Once | INTRAMUSCULAR | Status: AC
  Administered 2022-08-06: 25 mg via INTRAVENOUS
  Filled 2022-08-06: qty 1

## 2022-08-06 MED ORDER — PROCHLORPERAZINE EDISYLATE 10 MG/2ML IJ SOLN
10.0000 mg | Freq: Once | INTRAMUSCULAR | Status: AC
Start: 2022-08-06 — End: 2022-08-06
  Administered 2022-08-06: 10 mg via INTRAVENOUS
  Filled 2022-08-06: qty 2

## 2022-08-06 MED ORDER — KETOROLAC TROMETHAMINE 30 MG/ML IJ SOLN
30.0000 mg | Freq: Once | INTRAMUSCULAR | Status: AC
Start: 2022-08-06 — End: 2022-08-06
  Administered 2022-08-06: 30 mg via INTRAVENOUS
  Filled 2022-08-06: qty 1

## 2022-08-06 NOTE — Discharge Summary (Signed)
Physician Discharge Summary   Patient: Morgan [name] MRN: [id number] DOB: 1951/10/11  Admit date:     08/03/2022  Discharge date: 08/06/22  Discharge Physician: Lorella Nimrod   PCP: [name], MD   Recommendations at discharge:  Please obtain orthostatic vitals on follow-up Follow-up with primary care provider within a week Follow-up with neurology  Discharge Diagnoses: Principal Problem:   Syncope and collapse Active Problems:   Hypokalemia   Essential hypertension   Parkinson disease ([place])   CKD (chronic kidney disease) stage 3, GFR 30-59 ml/min (HCC)   COPD (chronic obstructive pulmonary disease) (HCC)   GERD (gastroesophageal reflux disease)   Anxiety   CHF (congestive heart failure) (HCC)   Chronic diastolic CHF (congestive heart failure) ([place])   Vascular dementia, unspecified severity, with anxiety ([place])   Anemia   Hospital Course: Taken from prior notes.  Morgan [name] is a 71 y.o. female with medical history significant of end-stage COPD, CHF, on chronic oxygen, diastolic CHF, GERD, essential hypertension, anxiety disorder, who presented with nausea and syncopal episode as well as dry heaving.  She was admitted for evaluation of syncopal episode and was also noted to have significant hypokalemia which is being repleted.  8/20: Patient continued to have some dry heaving and postural dizziness.  Orthostatic vitals were positive.  Giving some IV fluid. Parkinson's disease can be contributory to orthostatic vitals.  8/21: Overnight had migraine headache and was given 1 dose of Depakote with some relief.  Worsening migraine this morning.  Giving her migraine cocktail which included Toradol, Compazine and Benadryl.  Labs seems stable.  We stopped home carvedilol due to borderline blood pressure. Patient need to be seen by her neurologist closely for further recommendations as orthostatic vitals can be due to Parkinson's.  Patient will continue with rest of her home  medication and need to have close follow-up with her providers.  Assessment and Plan: * Syncope and collapse History of recurrent similar episodes.  It was initially thought to be due to dehydration. Orthostatic vitals checked today and they were positive. -Giving some more IV fluid -Recheck orthostatic vitals tomorrow -Parkinson's can be playing a role  Hypokalemia Potassium of 3.2 with magnesium of 2. -Repeat potassium and monitor  Essential hypertension Blood pressure currently within goal. Holding home Imdur and carvedilol for concern of positive orthostatic vitals and dizziness. -Continue to monitor -Can resume home meds if needed  Parkinson disease ([place]) - Continue home Sinemet  CKD (chronic kidney disease) stage 3, GFR 30-59 ml/min (HCC) Creatinine seems stable.  History of CKD stage III A -Monitor renal function -Avoid nephrotoxins  COPD (chronic obstructive pulmonary disease) (HCC) No concern of exacerbation. Saturating well on baseline oxygen requirement of 3 L. -Continue with as needed dilators  GERD (gastroesophageal reflux disease) - Continue with PPI   Consultants: None Procedures performed: None Disposition: Home Diet recommendation:  Discharge Diet Orders (From admission, onward)     Start     Ordered   08/06/22 0000  Diet - low sodium heart healthy        08/06/22 1124           Cardiac diet DISCHARGE MEDICATION: Allergies as of 08/06/2022       Reactions   Meperidine Hives, Anaphylaxis   Strawberry Extract Hives, Swelling   Sucralfate Nausea Only, Other (See Comments)   Severe nausea Other reaction(s): Other (See Comments) Severe nausea Severe nausea   Wasp Venom Hives        Medication  List     STOP taking these medications    carvedilol 12.5 MG tablet Commonly known as: COREG   methylphenidate 5 MG tablet Commonly known as: RITALIN       TAKE these medications    acetaminophen 325 MG tablet Commonly known as:  TYLENOL Take 2 tablets (650 mg total) by mouth every 4 (four) hours as needed for mild pain (or temp > 37.5 C (99.5 F)).   Aimovig 140 MG/ML Soaj Generic drug: Erenumab-aooe SMARTSIG:140 Milligram(s) SUB-Q Once a Month   aspirin-acetaminophen-caffeine 250-250-65 MG tablet Commonly known as: EXCEDRIN MIGRAINE Take 1-2 tablets by mouth every 6 (six) hours as needed for headache.   Breztri Aerosphere 160-9-4.8 MCG/ACT Aero Generic drug: Budeson-Glycopyrrol-Formoterol Inhale 2 puffs into the lungs in the morning and at bedtime.   carbidopa-levodopa 25-100 MG tablet Commonly known as: SINEMET IR Take 2 tablets by mouth 3 (three) times daily.   Cholecalciferol 1.25 MG (50000 UT) Tabs Take 1 tablet by mouth once a week.   diphenhydrAMINE 50 MG/ML injection Commonly known as: BENADRYL Inject 0.25 mLs (12.5 mg total) into the vein every 8 (eight) hours as needed for up to 5 days (nausea vomiting).   isosorbide mononitrate 30 MG 24 hr tablet Commonly known as: IMDUR Take 30 mg by mouth daily.   Klor-Con M20 20 MEQ tablet Generic drug: potassium chloride SA TAKE 1 TABLET BY MOUTH EVERY DAY   metoCLOPramide 5 MG tablet Commonly known as: REGLAN Take 1 tablet (5 mg total) by mouth 4 (four) times daily -  before meals and at bedtime.   montelukast 10 MG tablet Commonly known as: SINGULAIR Take 1 tablet (10 mg total) by mouth at bedtime.   OXYGEN Inhale 3 L/min into the lungs See admin instructions. 3 L/min at bedtime and as needed for shortness of breath throughout the day   pantoprazole 40 MG tablet Commonly known as: PROTONIX Take 1 tablet (40 mg total) by mouth 2 (two) times daily before a meal.   scopolamine 1 MG/3DAYS Commonly known as: TRANSDERM-SCOP Place 1 patch (1.5 mg total) onto the skin every 3 (three) days.        Follow-up Information     [name], MD. Schedule an appointment as soon as possible for a visit in 1 week(s).   Specialty: Family  Medicine Contact information: [place] [address] [phone]                Discharge Exam: Filed Weights   08/04/22 0500 08/05/22 0531 08/06/22 0511  Weight: 89.8 kg 86.7 kg 87.3 kg   General.     In no acute distress. Pulmonary.  Lungs clear bilaterally, normal respiratory effort. CV.  Regular rate and rhythm, no JVD, rub or murmur. Abdomen.  Soft, nontender, nondistended, BS positive. CNS.  Alert and oriented .  No focal neurologic deficit. Extremities.  No edema, no cyanosis, pulses intact and symmetrical. Psychiatry.  Judgment and insight appears normal.   Condition at discharge: stable  The results of significant diagnostics from this hospitalization (including imaging, microbiology, ancillary and laboratory) are listed below for reference.   Imaging Studies: CT ABDOMEN PELVIS W CONTRAST  Result Date: 08/03/2022 CLINICAL DATA:  Headache and nausea with loss of consciousness. EXAM: CT ABDOMEN AND PELVIS WITH CONTRAST TECHNIQUE: Multidetector CT imaging of the abdomen and pelvis was performed using the standard protocol following bolus administration of intravenous contrast. RADIATION DOSE REDUCTION: This exam was performed according to the departmental dose-optimization  program which includes automated exposure control, adjustment of the mA and/or kV according to patient size and/or use of iterative reconstruction technique. CONTRAST:  190m OMNIPAQUE IOHEXOL 300 MG/ML  SOLN COMPARISON:  May 04, 2022 FINDINGS: Lower chest: No acute abnormality. Hepatobiliary: No focal liver abnormality is seen. Status post cholecystectomy. No biliary dilatation. Pancreas: Unremarkable. No pancreatic ductal dilatation or surrounding inflammatory changes. Spleen: Normal in size without focal abnormality. Adrenals/Urinary Tract: Adrenal glands are unremarkable. Kidneys are normal, without renal calculi, focal lesion, or hydronephrosis. Bladder is unremarkable. Stomach/Bowel:  Stomach is within normal limits. The appendix is surgically absent. No evidence of bowel wall thickening, distention, or inflammatory changes. Noninflamed diverticula are seen throughout the sigmoid colon. Vascular/Lymphatic: Aortic atherosclerosis. No enlarged abdominal or pelvic lymph nodes. Reproductive: Status post hysterectomy. No adnexal masses. Other: No abdominal wall hernia or abnormality. No abdominopelvic ascites. Musculoskeletal: Degenerative changes are seen throughout the lumbar spine with evidence of prior vertebroplasty noted within the visualized portion of the lower thoracic spine. IMPRESSION: 1. Evidence of prior cholecystectomy and hysterectomy. 2. Sigmoid diverticulosis. 3. Aortic atherosclerosis. Aortic Atherosclerosis (ICD10-I70.0). Electronically Signed   By: TVirgina NorfolkM.D.   On: 08/03/2022 19:10   CT HEAD WO CONTRAST (5MM)  Result Date: 08/03/2022 CLINICAL DATA:  Headache and nausea with loss of consciousness. EXAM: CT HEAD WITHOUT CONTRAST TECHNIQUE: Contiguous axial images were obtained from the base of the skull through the vertex without intravenous contrast. RADIATION DOSE REDUCTION: This exam was performed according to the departmental dose-optimization program which includes automated exposure control, adjustment of the mA and/or kV according to patient size and/or use of iterative reconstruction technique. COMPARISON:  January 19, 2022 FINDINGS: Brain: No evidence of acute infarction, hemorrhage, hydrocephalus, extra-axial collection or mass lesion/mass effect. Vascular: No hyperdense vessel or unexpected calcification. Skull: Normal. Negative for fracture or focal lesion. Sinuses/Orbits: No acute finding. Other: None. IMPRESSION: No acute intracranial process. Electronically Signed   By: TVirgina NorfolkM.D.   On: 08/03/2022 19:04   CT Chest High Resolution  Result Date: 07/22/2022 CLINICAL DATA:  Interstitial lung disease EXAM: CT CHEST WITHOUT CONTRAST TECHNIQUE:  Multidetector CT imaging of the chest was performed following the standard protocol without intravenous contrast. High resolution imaging of the lungs, as well as inspiratory and expiratory imaging, was performed. RADIATION DOSE REDUCTION: This exam was performed according to the departmental dose-optimization program which includes automated exposure control, adjustment of the mA and/or kV according to patient size and/or use of iterative reconstruction technique. COMPARISON:  CT chest angiogram, 02/20/2021 FINDINGS: Cardiovascular: Aortic atherosclerosis. Enlargement of the tubular ascending thoracic aorta measuring up to 4.6 x 4.6 cm. Normal heart size. Scattered three-vessel coronary artery calcifications. No pericardial effusion. Mediastinum/Nodes: No enlarged mediastinal, hilar, or axillary lymph nodes. Thyroid gland, trachea, and esophagus demonstrate no significant findings. Lungs/Pleura: Mild fibrosis in a pattern with apical predominance, featuring irregular interstitial opacity and septal thickening with some associated ground-glass and architectural distortion. No significant air trapping on expiratory phase imaging. New nodular appearing consolidation of the posterior left upper lobe abutting the fissure measuring 1.3 x 1.1 cm (series 5, image 75). Occasional additional stable, definitively benign small pulmonary nodules. No pleural effusion or pneumothorax. Upper Abdomen: No acute abnormality. Musculoskeletal: No chest wall abnormality. No suspicious osseous lesions identified. Vertebral cement augmentation of the T9 and T10 vertebral bodies. IMPRESSION: 1. Mild fibrosis in a pattern with apical predominance, featuring irregular interstitial opacity and septal thickening with some associated ground-glass and architectural distortion. No significant air trapping  on expiratory phase imaging. Findings are suggestive of an alternative diagnosis (not UIP) per consensus guidelines, primarily suggesting mild,  chronic hypersensitivity pneumonitis or alternately nonspecific sequelae of prior infection or inflammation: Diagnosis of Idiopathic Pulmonary Fibrosis: An Official ATS/ERS/JRS/ALAT Clinical Practice Guideline. [place], Iss 5, [phone], Aug 17 2017. 2. New nodular appearing consolidation of the posterior left upper lobe abutting the fissure measuring 1.3 x 1.1 cm. This is most likely infectious or inflammatory although follow-up is recommended in 3 months to ensure stability or resolution. This recommendation follows the consensus statement: Guidelines for Management of Incidental Pulmonary Nodules Detected on CT Images: From the Fleischner Society 2017; Radiology 2017; 284:228-243. 3. Coronary artery disease. 4. Enlargement of the tubular ascending thoracic aorta measuring up to 4.6 x 4.6 cm. Ascending thoracic aortic aneurysm. Recommend semi-annual imaging followup by CTA or MRA and referral to cardiothoracic surgery if not already obtained. This recommendation follows 2010 ACCF/AHA/AATS/ACR/ASA/SCA/SCAI/SIR/STS/SVM Guidelines for the Diagnosis and Management of Patients With Thoracic Aortic Disease. Circulation. 2010; 121: U725-D664. Aortic aneurysm NOS (ICD10-I71.9) Aortic Atherosclerosis (ICD10-I70.0). Electronically Signed   By: Delanna Ahmadi M.D.   On: 07/22/2022 13:08   DG Chest 2 View  Result Date: 07/12/2022 CLINICAL DATA:  71 year old female with history of shortness of breath, wheezing, cough and chest pain since 07/07/2022. EXAM: CHEST - 2 VIEW COMPARISON:  Chest x-ray 04/12/2022. FINDINGS: Lung volumes are normal. No acute consolidative airspace disease. No pleural effusions. Areas of mild architectural distortion are again noted in the right middle lobe and lingula, similar to the prior study, presumably areas of mild chronic post infectious or inflammatory scarring. Bilateral apical pleuroparenchymal thickening and architectural distortion is also similar to prior  examinations, also likely areas of chronic post infectious or inflammatory scarring. No pneumothorax. No definite suspicious appearing pulmonary nodules or masses are noted. No evidence of pulmonary edema. Heart size is normal. Upper mediastinal contours are within normal limits. Post vertebroplasty changes are noted in the lower thoracic spine. IMPRESSION: 1. No radiographic evidence of acute cardiopulmonary disease. The appearance of the lungs is similar to prior studies, as above. Electronically Signed   By: Vinnie Langton M.D.   On: 07/12/2022 09:25    Microbiology: Results for orders placed or performed during the hospital encounter of 01/18/22  Resp Panel by RT-PCR (Flu A&B, Covid) Nasopharyngeal Swab     Status: None   Collection Time: 01/18/22 12:50 PM   Specimen: Nasopharyngeal Swab; Nasopharyngeal(NP) swabs in vial transport medium  Result Value Ref Range Status   SARS Coronavirus 2 by RT PCR NEGATIVE NEGATIVE Final    Comment: (NOTE) SARS-CoV-2 target nucleic acids are NOT DETECTED.  The SARS-CoV-2 RNA is generally detectable in upper respiratory specimens during the acute phase of infection. The lowest concentration of SARS-CoV-2 viral copies this assay can detect is 138 copies/mL. A negative result does not preclude SARS-Cov-2 infection and should not be used as the sole basis for treatment or other patient management decisions. A negative result may occur with  improper specimen collection/handling, submission of specimen other than nasopharyngeal swab, presence of viral mutation(s) within the areas targeted by this assay, and inadequate number of viral copies(<138 copies/mL). A negative result must be combined with clinical observations, patient history, and epidemiological information. The expected result is Negative.  Fact Sheet for Patients:  EntrepreneurPulse.com.au  Fact Sheet for Healthcare Providers:   IncredibleEmployment.be  This test is no t yet approved or cleared by the [place] and  has been authorized for detection and/or diagnosis of SARS-CoV-2 by FDA under an Emergency Use Authorization (EUA). This EUA will remain  in effect (meaning this test can be used) for the duration of the COVID-19 declaration under Section 564(b)(1) of the Act, 21 U.S.C.section 360bbb-3(b)(1), unless the authorization is terminated  or revoked sooner.       Influenza A by PCR NEGATIVE NEGATIVE Final   Influenza B by PCR NEGATIVE NEGATIVE Final    Comment: (NOTE) The Xpert Xpress SARS-CoV-2/FLU/RSV plus assay is intended as an aid in the diagnosis of influenza from Nasopharyngeal swab specimens and should not be used as a sole basis for treatment. Nasal washings and aspirates are unacceptable for Xpert Xpress SARS-CoV-2/FLU/RSV testing.  Fact Sheet for Patients: EntrepreneurPulse.com.au  Fact Sheet for Healthcare Providers: IncredibleEmployment.be  This test is not yet approved or cleared by the [place] FDA and has been authorized for detection and/or diagnosis of SARS-CoV-2 by FDA under an Emergency Use Authorization (EUA). This EUA will remain in effect (meaning this test can be used) for the duration of the COVID-19 declaration under Section 564(b)(1) of the Act, 21 U.S.C. section 360bbb-3(b)(1), unless the authorization is terminated or revoked.  Performed at [facility], [address] [place], [place] [address]   Urine Culture     Status: Abnormal   Collection Time: 01/18/22 12:58 PM   Specimen: Urine, Clean Catch  Result Value Ref Range Status   Specimen Description   Final    URINE, CLEAN CATCH Performed at [facility], [address] [place], [place] [address]    Special Requests   Final    NONE Performed at [facility], [place], [place] [address]     Culture >=100,000 COLONIES/mL ESCHERICHIA COLI (A)  Final   Report Status 01/21/2022 FINAL  Final   Organism ID, Bacteria ESCHERICHIA COLI (A)  Final      Susceptibility   Escherichia coli - MIC*    AMPICILLIN 8 SENSITIVE Sensitive     CEFAZOLIN <=4 SENSITIVE Sensitive     CEFEPIME <=0.12 SENSITIVE Sensitive     CEFTRIAXONE <=0.25 SENSITIVE Sensitive     CIPROFLOXACIN <=0.25 SENSITIVE Sensitive     GENTAMICIN <=1 SENSITIVE Sensitive     IMIPENEM <=0.25 SENSITIVE Sensitive     NITROFURANTOIN <=16 SENSITIVE Sensitive     TRIMETH/SULFA <=20 SENSITIVE Sensitive     AMPICILLIN/SULBACTAM 4 SENSITIVE Sensitive     PIP/TAZO <=4 SENSITIVE Sensitive     * >=100,000 COLONIES/mL ESCHERICHIA COLI  MRSA Next Gen by PCR, Nasal     Status: None   Collection Time: 01/18/22  4:59 PM   Specimen: Nasal Mucosa; Nasal Swab  Result Value Ref Range Status   MRSA by PCR Next Gen NOT DETECTED NOT DETECTED Final    Comment: (NOTE) The GeneXpert MRSA Assay (FDA approved for NASAL specimens only), is one component of a comprehensive MRSA colonization surveillance program. It is not intended to diagnose MRSA infection nor to guide or monitor treatment for MRSA infections. Test performance is not FDA approved in patients less than 22 years old. Performed at [facility], [address] [place], [place] [address]   Resp Panel by RT-PCR (Flu A&B, Covid) Nasopharyngeal Swab     Status: None   Collection Time: 01/30/22 11:16 AM   Specimen: Nasopharyngeal Swab; Nasopharyngeal(NP) swabs in vial transport medium  Result Value Ref Range Status   SARS Coronavirus 2 by RT PCR NEGATIVE NEGATIVE Final  Comment: (NOTE) SARS-CoV-2 target nucleic acids are NOT DETECTED.  The SARS-CoV-2 RNA is generally detectable in upper respiratory specimens during the acute phase of infection. The lowest concentration of SARS-CoV-2 viral copies this assay can detect is 138 copies/mL. A negative result does not preclude  SARS-Cov-2 infection and should not be used as the sole basis for treatment or other patient management decisions. A negative result may occur with  improper specimen collection/handling, submission of specimen other than nasopharyngeal swab, presence of viral mutation(s) within the areas targeted by this assay, and inadequate number of viral copies(<138 copies/mL). A negative result must be combined with clinical observations, patient history, and epidemiological information. The expected result is Negative.  Fact Sheet for Patients:  EntrepreneurPulse.com.au  Fact Sheet for Healthcare Providers:  IncredibleEmployment.be  This test is no t yet approved or cleared by the [place] FDA and  has been authorized for detection and/or diagnosis of SARS-CoV-2 by FDA under an Emergency Use Authorization (EUA). This EUA will remain  in effect (meaning this test can be used) for the duration of the COVID-19 declaration under Section 564(b)(1) of the Act, 21 U.S.C.section 360bbb-3(b)(1), unless the authorization is terminated  or revoked sooner.       Influenza A by PCR NEGATIVE NEGATIVE Final   Influenza B by PCR NEGATIVE NEGATIVE Final    Comment: (NOTE) The Xpert Xpress SARS-CoV-2/FLU/RSV plus assay is intended as an aid in the diagnosis of influenza from Nasopharyngeal swab specimens and should not be used as a sole basis for treatment. Nasal washings and aspirates are unacceptable for Xpert Xpress SARS-CoV-2/FLU/RSV testing.  Fact Sheet for Patients: EntrepreneurPulse.com.au  Fact Sheet for Healthcare Providers: IncredibleEmployment.be  This test is not yet approved or cleared by the [place] FDA and has been authorized for detection and/or diagnosis of SARS-CoV-2 by FDA under an Emergency Use Authorization (EUA). This EUA will remain in effect (meaning this test can be used) for the duration of  the COVID-19 declaration under Section 564(b)(1) of the Act, 21 U.S.C. section 360bbb-3(b)(1), unless the authorization is terminated or revoked.  Performed at [facility], [address] [place], [place] [address]     Labs: CBC: Recent Labs  Lab 08/03/22 1551 08/03/22 2058 08/04/22 0643  WBC 6.7 5.6 5.3  HGB 10.2* 9.9* 10.1*  HCT 32.6* 31.0* 31.8*  MCV 81.3 81.2 81.5  PLT 156 206 588   Basic Metabolic Panel: Recent Labs  Lab 08/03/22 1551 08/03/22 1838 08/03/22 2058 08/04/22 0643 08/05/22 0523 08/06/22 0524  NA 138  --   --  139 139 139  K 2.6*  --   --  3.1* 3.2* 3.7  CL 102  --   --  106 100 101  CO2 25  --   --  '28 31 31  '$ GLUCOSE 96  --   --  86 95 93  BUN 18  --   --  '14 12 10  '$ CREATININE 0.95  --  0.83 0.81 0.76 0.72  CALCIUM 8.2*  --   --  8.1* 8.6* 8.5*  MG  --  1.9  --   --  2.0  --    Liver Function Tests: Recent Labs  Lab 08/04/22 0643  AST 9*  ALT <5  ALKPHOS 108  BILITOT 0.5  PROT 5.3*  ALBUMIN 2.7*   CBG: Recent Labs  Lab 08/05/22 0529 08/06/22 0438  GLUCAP 96 87    Discharge time spent: greater than 30 minutes.  This record has  been created using Systems analyst. Errors have been sought and corrected,but may not always be located. Such creation errors do not reflect on the standard of care.   Signed: Lorella Nimrod, MD [facility] 08/06/2022

## 2022-08-06 NOTE — TOC Transition Note (Signed)
Transition of Care [facility]) - CM/SW Discharge Note   Patient Details  Name: Morgan [name] MRN: [id number] Date of Birth: [birth year]  [facility] CM/SW Contact:  Candie Chroman, LCSW Phone Number: 08/06/2022, 11:51 AM   Clinical Narrative:  Patient has orders to discharge home today. Readmission prevention screen complete. CSW met with patient. No supports at bedside. CSW introduced role and explained that discharge planning would be discussed. PCP is [name], MD. Patient drives herself to appointments. Pharmacy is [facility] [place]. No issues obtaining medications. No home health prior to admission. She has a cane, RW, and oxygen (4 L through [employer]) at home. She said [place] or [name] will take her home today and bring her oxygen. No further concerns. CSW signing off.   Final next level of care: Home/Self Care Barriers to Discharge: No Barriers Identified   Patient Goals and CMS Choice        Discharge Placement                Patient to be transferred to facility by: [name] or [name]   Patient and family notified of of transfer: 08/06/22  Discharge Plan and Services                                     Social Determinants of Health (SDOH) Interventions     Readmission Risk Interventions    08/06/2022   11:49 AM  Readmission Risk Prevention Plan  Transportation Screening Complete  PCP or Specialist Appt within 3-5 Days Complete  Social Work Consult for [place] Planning/Counseling Complete  Palliative Care Screening Not Applicable  Medication Review [employer] photographer) Complete

## 2022-08-06 NOTE — Telephone Encounter (Signed)
Noted, pt scheduled for hospital f/u this week

## 2022-08-07 ENCOUNTER — Telehealth: Payer: Self-pay

## 2022-08-07 ENCOUNTER — Telehealth: Payer: Self-pay | Admitting: Pulmonary Disease

## 2022-08-07 NOTE — Telephone Encounter (Signed)
Attempted to call pt but line went directly to VM. Left message for her to return call. °

## 2022-08-07 NOTE — Telephone Encounter (Signed)
Transition Care Management Unsuccessful Follow-up Telephone Call  Date of discharge and from where:  TCM DC [facility] 08-06-22 Dx: syncope and collapse  Attempts:  1st Attempt  Reason for unsuccessful TCM follow-up call:  Left voice message   Transition Care Management Unsuccessful Follow-up Telephone Call  Date of discharge and from where:  TCM DC [facility] 08-06-22 Dx: syncope and collapse  Attempts:  2nd Attempt  Reason for unsuccessful TCM follow-up call:  Unable to leave message   Transition Care Management Unsuccessful Follow-up Telephone Call  Date of discharge and from where:  TCM DC [facility] 08-06-22 Dx: syncope and collapse  Attempts:  3rd Attempt  Reason for unsuccessful TCM follow-up call:  Left voice message

## 2022-08-07 NOTE — Telephone Encounter (Signed)
Patient is returning phone call. Patient phone number is [phone].

## 2022-08-08 ENCOUNTER — Observation Stay
Admission: EM | Admit: 2022-08-08 | Discharge: 2022-08-09 | Disposition: A | Discharge status: Home / Self Care | Payer: Medicare HMO | Attending: Internal Medicine | Admitting: Internal Medicine

## 2022-08-08 ENCOUNTER — Telehealth: Payer: Self-pay

## 2022-08-08 ENCOUNTER — Emergency Department: Payer: Medicare HMO

## 2022-08-08 ENCOUNTER — Encounter: Payer: Self-pay | Admitting: Medical Oncology

## 2022-08-08 ENCOUNTER — Other Ambulatory Visit: Payer: Self-pay

## 2022-08-08 DIAGNOSIS — R55 Syncope and collapse: Secondary | ICD-10-CM | POA: Diagnosis not present

## 2022-08-08 DIAGNOSIS — I110 Hypertensive heart disease with heart failure: Secondary | ICD-10-CM | POA: Insufficient documentation

## 2022-08-08 DIAGNOSIS — I509 Heart failure, unspecified: Secondary | ICD-10-CM | POA: Diagnosis not present

## 2022-08-08 DIAGNOSIS — Z7982 Long term (current) use of aspirin: Secondary | ICD-10-CM | POA: Insufficient documentation

## 2022-08-08 DIAGNOSIS — I6523 Occlusion and stenosis of bilateral carotid arteries: Secondary | ICD-10-CM | POA: Diagnosis not present

## 2022-08-08 DIAGNOSIS — R000 Tachycardia, unspecified: Secondary | ICD-10-CM | POA: Diagnosis not present

## 2022-08-08 DIAGNOSIS — Z743 Need for continuous supervision: Secondary | ICD-10-CM | POA: Diagnosis not present

## 2022-08-08 DIAGNOSIS — R42 Dizziness and giddiness: Secondary | ICD-10-CM | POA: Diagnosis not present

## 2022-08-08 DIAGNOSIS — G20 Parkinson's disease: Secondary | ICD-10-CM | POA: Diagnosis not present

## 2022-08-08 DIAGNOSIS — Z79899 Other long term (current) drug therapy: Secondary | ICD-10-CM | POA: Insufficient documentation

## 2022-08-08 DIAGNOSIS — I10 Essential (primary) hypertension: Secondary | ICD-10-CM | POA: Diagnosis not present

## 2022-08-08 DIAGNOSIS — J449 Chronic obstructive pulmonary disease, unspecified: Secondary | ICD-10-CM | POA: Insufficient documentation

## 2022-08-08 DIAGNOSIS — Z87891 Personal history of nicotine dependence: Secondary | ICD-10-CM | POA: Insufficient documentation

## 2022-08-08 DIAGNOSIS — G43909 Migraine, unspecified, not intractable, without status migrainosus: Secondary | ICD-10-CM | POA: Diagnosis not present

## 2022-08-08 DIAGNOSIS — K760 Fatty (change of) liver, not elsewhere classified: Secondary | ICD-10-CM | POA: Diagnosis not present

## 2022-08-08 DIAGNOSIS — N390 Urinary tract infection, site not specified: Secondary | ICD-10-CM | POA: Diagnosis not present

## 2022-08-08 DIAGNOSIS — R519 Headache, unspecified: Secondary | ICD-10-CM | POA: Diagnosis present

## 2022-08-08 DIAGNOSIS — Z9981 Dependence on supplemental oxygen: Secondary | ICD-10-CM | POA: Insufficient documentation

## 2022-08-08 DIAGNOSIS — G43719 Chronic migraine without aura, intractable, without status migrainosus: Secondary | ICD-10-CM

## 2022-08-08 DIAGNOSIS — I7121 Aneurysm of the ascending aorta, without rupture: Secondary | ICD-10-CM | POA: Diagnosis not present

## 2022-08-08 DIAGNOSIS — G43109 Migraine with aura, not intractable, without status migrainosus: Secondary | ICD-10-CM | POA: Diagnosis not present

## 2022-08-08 DIAGNOSIS — R0602 Shortness of breath: Secondary | ICD-10-CM | POA: Diagnosis not present

## 2022-08-08 DIAGNOSIS — G4489 Other headache syndrome: Secondary | ICD-10-CM | POA: Diagnosis not present

## 2022-08-08 DIAGNOSIS — K6389 Other specified diseases of intestine: Secondary | ICD-10-CM | POA: Diagnosis not present

## 2022-08-08 DIAGNOSIS — K5730 Diverticulosis of large intestine without perforation or abscess without bleeding: Secondary | ICD-10-CM | POA: Diagnosis not present

## 2022-08-08 LAB — URINALYSIS, ROUTINE W REFLEX MICROSCOPIC
Bilirubin Urine: NEGATIVE
Glucose, UA: NEGATIVE mg/dL
Hgb urine dipstick: NEGATIVE
Ketones, ur: NEGATIVE mg/dL
Nitrite: POSITIVE — AB
Protein, ur: NEGATIVE mg/dL
Specific Gravity, Urine: 1.008 (ref 1.005–1.030)
pH: 6 (ref 5.0–8.0)

## 2022-08-08 LAB — BASIC METABOLIC PANEL
Anion gap: 12 (ref 5–15)
BUN: 12 mg/dL (ref 8–23)
CO2: 24 mmol/L (ref 22–32)
Calcium: 9.3 mg/dL (ref 8.9–10.3)
Chloride: 102 mmol/L (ref 98–111)
Creatinine, Ser: 0.95 mg/dL (ref 0.44–1.00)
GFR, Estimated: >60 mL/min (ref >60)
Glucose, Bld: 137 mg/dL — ABNORMAL HIGH (ref 70–99)
Potassium: 3.4 mmol/L — ABNORMAL LOW (ref 3.5–5.1)
Sodium: 138 mmol/L (ref 135–145)

## 2022-08-08 LAB — BLOOD GAS, VENOUS
Acid-Base Excess: 12.7 mmol/L — ABNORMAL HIGH (ref 0.0–2.0)
Bicarbonate: 38.1 mmol/L — ABNORMAL HIGH (ref 20.0–28.0)
O2 Saturation: 23.4 %
Patient temperature: 37
pCO2, Ven: 50 mmHg (ref 44–60)
pH, Ven: 7.49 — ABNORMAL HIGH (ref 7.25–7.43)
pO2, Ven: <31 mmHg — CL (ref 32–45)

## 2022-08-08 LAB — CBC
HCT: 37.6 % (ref 36.0–46.0)
Hemoglobin: 12.2 g/dL (ref 12.0–15.0)
MCH: 25.5 pg — ABNORMAL LOW (ref 26.0–34.0)
MCHC: 32.4 g/dL (ref 30.0–36.0)
MCV: 78.7 fL — ABNORMAL LOW (ref 80.0–100.0)
Platelets: 363 10*3/uL (ref 150–400)
RBC: 4.78 MIL/uL (ref 3.87–5.11)
RDW: 15.3 % (ref 11.5–15.5)
WBC: 7.4 10*3/uL (ref 4.0–10.5)
nRBC: 0 % (ref 0.0–0.2)

## 2022-08-08 LAB — HEPATIC FUNCTION PANEL
ALT: <5 U/L (ref 0–44)
AST: 14 U/L — ABNORMAL LOW (ref 15–41)
Albumin: 3.3 g/dL — ABNORMAL LOW (ref 3.5–5.0)
Alkaline Phosphatase: 147 U/L — ABNORMAL HIGH (ref 38–126)
Bilirubin, Direct: <0.1 mg/dL (ref 0.0–0.2)
Total Bilirubin: 0.7 mg/dL (ref 0.3–1.2)
Total Protein: 6.7 g/dL (ref 6.5–8.1)

## 2022-08-08 LAB — TROPONIN I (HIGH SENSITIVITY)
Troponin I (High Sensitivity): 5 ng/L (ref <18)
Troponin I (High Sensitivity): 4 ng/L (ref <18)

## 2022-08-08 LAB — BRAIN NATRIURETIC PEPTIDE: B Natriuretic Peptide: 77.7 pg/mL (ref 0.0–100.0)

## 2022-08-08 MED ORDER — ACETAMINOPHEN 325 MG PO TABS
650.0000 mg | ORAL_TABLET | Freq: Once | ORAL | Status: AC
  Administered 2022-08-08: 650 mg via ORAL
  Filled 2022-08-08: qty 2

## 2022-08-08 MED ORDER — ONDANSETRON 4 MG PO TBDP
4.0000 mg | ORAL_TABLET | Freq: Once | ORAL | Status: AC
  Administered 2022-08-08: 4 mg via ORAL
  Filled 2022-08-08: qty 1

## 2022-08-08 MED ORDER — SODIUM CHLORIDE 0.9 % IV SOLN
1.0000 g | INTRAVENOUS | Status: DC
  Administered 2022-08-08: 1 g via INTRAVENOUS
  Filled 2022-08-08 (×3): qty 10

## 2022-08-08 MED ORDER — VITAMIN D 25 MCG (1000 UNIT) PO TABS
1000.0000 [IU] | ORAL_TABLET | ORAL | Status: DC
  Filled 2022-08-08: qty 1

## 2022-08-08 MED ORDER — ENOXAPARIN SODIUM 40 MG/0.4ML IJ SOSY
40.0000 mg | PREFILLED_SYRINGE | INTRAMUSCULAR | Status: DC
  Administered 2022-08-08: 40 mg via SUBCUTANEOUS
  Filled 2022-08-08: qty 0.4

## 2022-08-08 MED ORDER — MAGNESIUM SULFATE 2 GM/50ML IV SOLN
2.0000 g | Freq: Once | INTRAVENOUS | Status: AC
  Administered 2022-08-08: 2 g via INTRAVENOUS
  Filled 2022-08-08: qty 50

## 2022-08-08 MED ORDER — DOCUSATE SODIUM 100 MG PO CAPS
100.0000 mg | ORAL_CAPSULE | Freq: Two times a day (BID) | ORAL | Status: DC
  Administered 2022-08-08: 100 mg via ORAL
  Filled 2022-08-08 (×2): qty 1

## 2022-08-08 MED ORDER — IOHEXOL 350 MG/ML SOLN
125.0000 mL | Freq: Once | INTRAVENOUS | Status: AC | PRN
  Administered 2022-08-08: 125 mL via INTRAVENOUS

## 2022-08-08 MED ORDER — ASPIRIN-ACETAMINOPHEN-CAFFEINE 250-250-65 MG PO TABS: 1.0000 | ORAL_TABLET | Freq: Four times a day (QID) | ORAL | Status: DC | PRN

## 2022-08-08 MED ORDER — METOCLOPRAMIDE HCL 5 MG PO TABS
5.0000 mg | ORAL_TABLET | Freq: Three times a day (TID) | ORAL | Status: DC
  Administered 2022-08-08 – 2022-08-09 (×3): 5 mg via ORAL
  Filled 2022-08-08 (×4): qty 1

## 2022-08-08 MED ORDER — LABETALOL HCL 5 MG/ML IV SOLN
INTRAVENOUS | Status: AC
  Filled 2022-08-08: qty 4

## 2022-08-08 MED ORDER — CARBIDOPA-LEVODOPA 25-100 MG PO TABS
2.0000 | ORAL_TABLET | Freq: Three times a day (TID) | ORAL | Status: DC
  Administered 2022-08-08 – 2022-08-09 (×2): 2 via ORAL
  Filled 2022-08-08 (×2): qty 2

## 2022-08-08 MED ORDER — ISOSORBIDE MONONITRATE ER 30 MG PO TB24
30.0000 mg | ORAL_TABLET | Freq: Every day | ORAL | Status: DC
  Administered 2022-08-08 – 2022-08-09 (×2): 30 mg via ORAL
  Filled 2022-08-08 (×2): qty 1

## 2022-08-08 MED ORDER — POTASSIUM CHLORIDE CRYS ER 20 MEQ PO TBCR
20.0000 meq | EXTENDED_RELEASE_TABLET | Freq: Every day | ORAL | Status: DC
  Administered 2022-08-08 – 2022-08-09 (×2): 20 meq via ORAL
  Filled 2022-08-08 (×2): qty 1

## 2022-08-08 MED ORDER — MONTELUKAST SODIUM 10 MG PO TABS
10.0000 mg | ORAL_TABLET | Freq: Every day | ORAL | Status: DC
  Administered 2022-08-08: 10 mg via ORAL
  Filled 2022-08-08: qty 1

## 2022-08-08 MED ORDER — ACETAMINOPHEN 325 MG PO TABS
650.0000 mg | ORAL_TABLET | Freq: Four times a day (QID) | ORAL | Status: DC | PRN
  Administered 2022-08-09: 650 mg via ORAL
  Filled 2022-08-08: qty 2

## 2022-08-08 MED ORDER — ONDANSETRON HCL 4 MG/2ML IJ SOLN
4.0000 mg | Freq: Four times a day (QID) | INTRAMUSCULAR | Status: DC | PRN
  Filled 2022-08-08: qty 2

## 2022-08-08 MED ORDER — TRAZODONE HCL 50 MG PO TABS
25.0000 mg | ORAL_TABLET | Freq: Every evening | ORAL | Status: DC | PRN
  Administered 2022-08-08: 25 mg via ORAL
  Filled 2022-08-08: qty 1

## 2022-08-08 MED ORDER — IBUPROFEN 400 MG PO TABS
400.0000 mg | ORAL_TABLET | Freq: Four times a day (QID) | ORAL | Status: DC | PRN
  Administered 2022-08-08 – 2022-08-09 (×2): 400 mg via ORAL
  Filled 2022-08-08 (×2): qty 1

## 2022-08-08 MED ORDER — PROCHLORPERAZINE EDISYLATE 10 MG/2ML IJ SOLN
10.0000 mg | Freq: Once | INTRAMUSCULAR | Status: AC
  Administered 2022-08-08: 10 mg via INTRAVENOUS
  Filled 2022-08-08: qty 2

## 2022-08-08 MED ORDER — ONDANSETRON HCL 4 MG PO TABS: 4.0000 mg | ORAL_TABLET | Freq: Four times a day (QID) | ORAL | Status: DC | PRN

## 2022-08-08 MED ORDER — SODIUM CHLORIDE 0.9 % IV SOLN
12.5000 mg | Freq: Once | INTRAVENOUS | Status: AC
  Administered 2022-08-08: 12.5 mg via INTRAVENOUS
  Filled 2022-08-08: qty 0.5

## 2022-08-08 MED ORDER — SUMATRIPTAN SUCCINATE 50 MG PO TABS
100.0000 mg | ORAL_TABLET | Freq: Once | ORAL | Status: AC
  Administered 2022-08-08: 100 mg via ORAL
  Filled 2022-08-08: qty 2

## 2022-08-08 MED ORDER — DIPHENHYDRAMINE HCL 50 MG/ML IJ SOLN
25.0000 mg | Freq: Once | INTRAMUSCULAR | Status: AC
  Administered 2022-08-08: 25 mg via INTRAVENOUS
  Filled 2022-08-08: qty 1

## 2022-08-08 MED ORDER — ACETAMINOPHEN 650 MG RE SUPP: 650.0000 mg | Freq: Four times a day (QID) | RECTAL | Status: DC | PRN

## 2022-08-08 NOTE — Telephone Encounter (Signed)
Noted, patient is scheduled for hospital f/u tomorrow.   Please plan to reschedule appointment if patient is admitted.

## 2022-08-08 NOTE — Telephone Encounter (Signed)
I spoke with Morgan [name](DPR signed) pt has had continuous H/A and vomiting. Morgan [name] said pt does not have CP or SOB. Morgan [name] said that pt cannot walk. Morgan [name] said the ambulance is there now and pt is going to [facility] ED. Sending note to Dr Einar Pheasant and [facility] CMA.    [place] Day - Client TELEPHONE ADVICE RECORD [facility] Patient Name: Morgan [name] ID Gender: Female DOB: October 10, 1951 Age: 71 Y 44 M 18 D Return Phone Number: [phone] (Primary) Address: City/ State/ Zip: [place] [address] Client [place] Day - Client Client Site [place] - Day Provider [name]- MD Contact Type Call Who Is Calling Patient / Member / Family / Caregiver Call Type Triage / Clinical Relationship To Patient Self Return Phone Number [phone] (Primary) Chief Complaint Dizziness Reason for Call Symptomatic / Request for [place] states she had been hospitalized for dizziness. PT has been up all night vomiting and with a very bad headache. Additional Comment Office calling, PT not on the line for clarification of sx. [place] Not Listed EMS to determine. Translation No Nurse Assessment Nurse: Raenette Rover, RN, Zella Ball Date/Time (Eastern Time): 08/08/2022 9:29:41 AM Confirm and document reason for call. If symptomatic, describe symptoms. ---Was discharged from hospital on Monday and went in Friday. Is currently having chest pain. Has been vomiting and dizziness. Does the patient have any new or worsening symptoms? ---Yes Will a triage be completed? ---Yes Related visit to physician within the last 2 weeks? ---Yes Does the PT have any chronic conditions? (i.e. diabetes, asthma, this includes High risk factors for pregnancy, etc.) ---Yes List chronic conditions. ---copd chf and wears oxygen. Is this a behavioral health or substance abuse call? ---No Guidelines Guideline Title Affirmed  Question Affirmed Notes Nurse Date/Time (Eastern Time) Chest Pain SEVERE difficulty breathing (e.g., struggling for each breath, speaks in single words) Raenette Rover, RN, Zella Ball 08/08/2022 9:31:06 AM PLEASE NOTE: All timestamps contained within this report are represented as [place] Standard Time. CONFIDENTIALTY NOTICE: This fax transmission is intended only for the addressee. It contains information that is legally privileged, confidential or otherwise protected from use or disclosure. If you are not the intended recipient, you are strictly prohibited from reviewing, disclosing, copying using or disseminating any of this information or taking any action in reliance on or regarding this information. If you have received this fax in error, please notify us immediately by telephone so that we can arrange for its return to [place]. Phone: [phone], Toll-Free: [phone], Fax: [phone] Page: 2 of 2 Call Id: [id number] [place]. Time Eilene Ghazi Time) Disposition Final User 08/08/2022 9:31:18 AM Call EMS 911 Now Yes Lahoma Crocker 08/08/2022 9:38:19 AM [place], RN, Zella Ball Reason: No answer to the call back. Final Disposition 08/08/2022 9:31:18 AM Call EMS 911 Now Yes Raenette Rover, RN, Herbert Deaner Disagree/Comply Comply Caller Understands Yes PreDisposition Go to ED Care Advice Given Per Guideline CALL EMS 911 NOW: CARE ADVICE given per Chest Pain (Adult) guideline. Comments User: Wilson Singer, RN Date/Time Eilene Ghazi Time): 08/08/2022 9:34:32 AM At beginning of call she said shes going back to the hospital. Advised I will just do a quick assessment. after confirming reason for calling I also ask if she is having chest pain and she said yes. Is having to speak in a whisper and said she is very sob and more than usual wears oxygen and has copd, and Chf. Referrals GO TO FACILITY OTHER - SPECIF

## 2022-08-08 NOTE — Telephone Encounter (Signed)
ATC, no answer, left vm to return call.  No additional # listed.

## 2022-08-08 NOTE — Telephone Encounter (Signed)
ATC pt but unable to reach. Left message to return call.

## 2022-08-08 NOTE — ED Notes (Signed)
RN aware bed assigned ?

## 2022-08-08 NOTE — H&P (Signed)
History and Physical    Patient: Morgan [name] [id number] DOB: [birth year] DOA: 08/08/2022 DOS: the patient was seen and examined on 08/08/2022 PCP: [name], MD  Patient coming from: [place] at bedside Chief Complaint: I am having headache Chief Complaint  Patient presents with   Nausea   Dizziness   Headache   HPI: Morgan [name] is a 72 y.o. female with medical history significant of Parkinson's disease, complex migraines with aura, anxiety, COPD, Gerd, chronic anemia who follows with Dr. Manuella Ghazi neurology as outpatient was discharged recently after syncopal episode with essential benign workup. Patient comes in today with dry heaves and complains of headache more. She describes headache on a daily basis. Tells me she is been having it for last few months but pain is more today. She keeps her eyes covered with washcloth. Denies vomiting. Denies any weakness in upper and lower extremity focal. Has generalized weakness with anxiety.  CT head/neck angio no acute abnormality. CT angiogram of the chest no acute abnormality. Chronic changes as before.  Patient tells me he that she takes about 6 to 7 Excedrin's over-the-counter. Patient was found to have abnormal urine. She denies any dysuria. She was received a dose of IV Rocephin. Patient denies any fever. She is independent and drives to her appointments. She lives with her roommate.  Patient is on chronic 4 L nasal cannula oxygen for COPD  Review of Systems: As mentioned in the history of present illness. All other systems reviewed and are negative. Past Medical History:  Diagnosis Date   Allergy    Anemia    Anxiety    Arthritis    CHF (congestive heart failure) (HCC)    COPD (chronic obstructive pulmonary disease) (HCC)    Depression    GERD (gastroesophageal reflux disease)    Hypertension    MVP (mitral valve prolapse)    Parkinson disease ([place])    Past Surgical History:  Procedure Laterality Date    ABDOMINAL HYSTERECTOMY     APPENDECTOMY     BALLOON DILATION N/A 04/21/2020   Procedure: BALLOON DILATION;  Surgeon: Milus Banister, MD;  Location: [facility];  Service: Endoscopy;  Laterality: N/A;  pyloric/ GI   BALLOON DILATION N/A 07/04/2020   Procedure: BALLOON DILATION;  Surgeon: Mauri Pole, MD;  Location: [facility];  Service: Endoscopy;  Laterality: N/A;   BIOPSY  04/21/2020   Procedure: BIOPSY;  Surgeon: Milus Banister, MD;  Location: [facility];  Service: Endoscopy;;   BIOPSY  07/04/2020   Procedure: BIOPSY;  Surgeon: Mauri Pole, MD;  Location: [facility];  Service: Endoscopy;;  EGD and COLON   BREAST SURGERY Right 1988   tumor removal   CHOLECYSTECTOMY N/A 05/18/2020   Procedure: LAPAROSCOPIC CHOLECYSTECTOMY;  Surgeon: Johnathan Hausen, MD;  Location: [facility];  Service: General;  Laterality: N/A;   COLONOSCOPY WITH PROPOFOL N/A 07/04/2020   Procedure: COLONOSCOPY WITH PROPOFOL;  Surgeon: Mauri Pole, MD;  Location: [facility];  Service: Endoscopy;  Laterality: N/A;   COLONOSCOPY WITH PROPOFOL N/A 11/03/2021   Procedure: COLONOSCOPY WITH PROPOFOL;  Surgeon: Lesly Rubenstein, MD;  Location: [facility] ENDOSCOPY;  Service: Endoscopy;  Laterality: N/A;   ESOPHAGEAL DILATION  08/11/2020   Procedure: PYLORIC DILATION;  Surgeon: Doran Stabler, MD;  Location: [facility];  Service: Gastroenterology;;   ESOPHAGOGASTRODUODENOSCOPY (EGD) WITH PROPOFOL N/A 04/21/2020   Procedure: ESOPHAGOGASTRODUODENOSCOPY (EGD) WITH PROPOFOL;  Surgeon: Milus Banister, MD;  [facility]: [facility];  Service:  Endoscopy;  Laterality: N/A;   ESOPHAGOGASTRODUODENOSCOPY (EGD) WITH PROPOFOL N/A 07/04/2020   Procedure: ESOPHAGOGASTRODUODENOSCOPY (EGD) WITH PROPOFOL;  Surgeon: Mauri Pole, MD;  [facility]: [facility];  Service: Endoscopy;  Laterality: N/A;   ESOPHAGOGASTRODUODENOSCOPY (EGD) WITH PROPOFOL N/A 08/11/2020   Procedure: ESOPHAGOGASTRODUODENOSCOPY (EGD) WITH PROPOFOL;   Surgeon: Doran Stabler, MD;  Location: [facility];  Service: Gastroenterology;  Laterality: N/A;   ESOPHAGOGASTRODUODENOSCOPY (EGD) WITH PROPOFOL N/A 11/03/2021   Procedure: ESOPHAGOGASTRODUODENOSCOPY (EGD) WITH PROPOFOL;  Surgeon: Lesly Rubenstein, MD;  Location: [facility] ENDOSCOPY;  Service: Endoscopy;  Laterality: N/A;   ESOPHAGOGASTRODUODENOSCOPY (EGD) WITH PROPOFOL N/A 05/07/2022   Procedure: ESOPHAGOGASTRODUODENOSCOPY (EGD) WITH PROPOFOL;  Surgeon: Lin Landsman, MD;  Location: [facility];  Service: Gastroenterology;  Laterality: N/A;   KNEE SURGERY  2005   2 times left   KNEE SURGERY Right    KYPHOPLASTY N/A 07/20/2021   Procedure: T 10KYPHOPLASTY;  Surgeon: Hessie Knows, MD;  Location: [facility] ORS;  Service: Orthopedics;  Laterality: N/A;   KYPHOPLASTY N/A 08/23/2021   Procedure: T9 KYPHOPLASTY;  Surgeon: Hessie Knows, MD;  Location: [facility] ORS;  Service: Orthopedics;  Laterality: N/A;   POLYPECTOMY  07/04/2020   Procedure: POLYPECTOMY;  Surgeon: Mauri Pole, MD;  Location: [facility];  Service: Endoscopy;;   TUBAL LIGATION     Social History:  reports that she quit smoking about 4 years ago. Her smoking use included cigarettes. She has a 15.00 pack-year smoking history. She has never used smokeless tobacco. She reports that she does not currently use alcohol. She reports that she does not use drugs.  Allergies  Allergen Reactions   Meperidine Hives and Anaphylaxis   Strawberry Extract Hives and Swelling   Sucralfate Nausea Only and Other (See Comments)    Severe nausea Other reaction(s): Other (See Comments) Severe nausea Severe nausea   Wasp Venom Hives    Family History  Problem Relation Age of Onset   Breast cancer Mother 16   Ovarian cancer Mother 36   Brain cancer Mother    Hypertension Father    Hyperlipidemia Father    Heart disease Father    Colon cancer Father        diagnosed in his 25s   Breast cancer Maternal Grandmother 85   Diabetes  Paternal Grandmother    Breast cancer Paternal Grandmother 30   Other Son        drug over dose   Colon cancer Maternal Grandfather        dx in his 25s   Liver disease Maternal Grandfather    Breast cancer Maternal Aunt 60   Breast cancer Maternal Aunt 60   Colon cancer Maternal Uncle    Stomach cancer Neg Hx    Pancreatic cancer Neg Hx    Esophageal cancer Neg Hx     Prior to Admission medications   Medication Sig Start Date End Date Taking? Authorizing Provider  carbidopa-levodopa (SINEMET IR) 25-100 MG tablet Take 2 tablets by mouth 3 (three) times daily. 11/21/21  Yes [provider]  acetaminophen (TYLENOL) 325 MG tablet Take 2 tablets (650 mg total) by mouth every 4 (four) hours as needed for mild pain (or temp > 37.5 C (99.5 F)). 01/30/22   Sharen Hones, MD  AIMOVIG 140 MG/ML SOAJ SMARTSIG:140 Milligram(s) SUB-Q Once a Month Patient not taking: Reported on 08/08/2022 02/21/22   [provider]  aspirin-acetaminophen-caffeine (EXCEDRIN MIGRAINE) [phone] MG tablet Take 1-2 tablets by mouth every 6 (six) hours as needed for  headache.    [provider]  Budeson-Glycopyrrol-Formoterol (BREZTRI AEROSPHERE) 160-9-4.8 MCG/ACT AERO Inhale 2 puffs into the lungs in the morning and at bedtime. Patient not taking: Reported on 08/08/2022 04/13/22   Hunsucker, Bonna Gains, MD  Cholecalciferol 1.25 MG (50000 UT) TABS Take 1 tablet by mouth once a week. Patient not taking: Reported on 08/08/2022 02/20/22   [name], MD  diphenhydrAMINE (BENADRYL) 50 MG/ML injection Inject 0.25 mLs (12.5 mg total) into the vein every 8 (eight) hours as needed for up to 5 days (nausea vomiting). 05/10/22 05/15/22  Nolberto Hanlon, MD  isosorbide mononitrate (IMDUR) 30 MG 24 hr tablet Take 30 mg by mouth daily. Patient not taking: Reported on 08/08/2022 07/26/22   [provider]  KLOR-CON M20 20 MEQ tablet TAKE 1 TABLET BY MOUTH EVERY DAY Patient not taking: Reported on 08/08/2022  06/20/22   [name], MD  metoCLOPramide (REGLAN) 5 MG tablet Take 1 tablet (5 mg total) by mouth 4 (four) times daily -  before meals and at bedtime. Patient not taking: Reported on 08/08/2022 07/13/22   [name], MD  montelukast (SINGULAIR) 10 MG tablet Take 1 tablet (10 mg total) by mouth at bedtime. Patient not taking: Reported on 08/08/2022 04/13/22   Hunsucker, Bonna Gains, MD  OXYGEN Inhale 3 L/min into the lungs See admin instructions. 3 L/min at bedtime and as needed for shortness of breath throughout the day    [provider]  pantoprazole (PROTONIX) 40 MG tablet Take 1 tablet (40 mg total) by mouth 2 (two) times daily before a meal. Patient not taking: Reported on 08/05/2022 05/10/22 07/20/23  Nolberto Hanlon, MD  scopolamine (TRANSDERM-SCOP) 1 MG/3DAYS Place 1 patch (1.5 mg total) onto the skin every 3 (three) days. Patient not taking: Reported on 08/08/2022 07/19/22   [name], MD    Physical Exam: Vitals:   08/08/22 1108 08/08/22 1109 08/08/22 1329 08/08/22 1400  BP: (!) 161/94  (!) 179/93 (!) 164/91  Pulse: 100  73 74  Resp: '20  18 18  '$ Temp: (!) 97.5 F (36.4 C)  98 F (36.7 C)   TempSrc: Oral  Oral   SpO2: 100%  100% 100%  Weight:  87 kg    Height:  '6\' 1"'$  (1.854 m)      Assessment and Plan: Morgan [name] is a 71 y.o. female with medical history significant of Parkinson's disease, complex migraines with aura, anxiety, COPD, Gerd, chronic anemia who follows with Dr. Gifford Shave neurology as outpatient was discharged recently after syncopal episode with essential benign workup. Patient comes in today with dry heaves and complains of headache more. She describes headache on a daily basis. Tells me she is been having it for last few months but pain is more today.  Denies any syncopal episode or passing out.  Migraine with complex aura , acute on chronic -- patient has history of hemiplegic migraine with left hemipahresis -- per outpatient neurology notes by  Dr. Manuella Ghazi -Per Dr Manuella Ghazi-- Continue Aimovig monthly injection - not taking, last injection was in June. Will consider Nurtec in future - would avoid triptans in setting of hemiplegic migraine . --Neurology consult with Dr Cheral Marker --prn ibuprofen  --will give sumatriptan x1 for now --CT head/neck angio--nothing acute  Parkinson's disease -- continue Sinemet  COPD -- continue chronic oxygen -- sats hundred percent -- PRN bronchodilators  Jerrye Bushy -- new PPI  Hypertension -- blood pressure stable resume home meds  Abnormal UA without  symptoms -- patient received a dose of IV Rocephin -- her white count is normal, no fever, no symptoms will hold off antibiotic   Advance Care Planning:   Code Status: Prior full  Consults: neurology  Family Communication: brother Roel Cluck  Severity of Illness: The appropriate patient status for this patient is OBSERVATION. Observation status is judged to be reasonable and necessary in order to provide the required intensity of service to ensure the patient's safety. The patient's presenting symptoms, physical exam findings, and initial radiographic and laboratory data in the context of their medical condition is felt to place them at decreased risk for further clinical deterioration. Furthermore, it is anticipated that the patient will be medically stable for discharge from the hospital within 2 midnights of admission.   Author: Fritzi Mandes, MD 08/08/2022 4:18 PM  For on call review [email or web address].

## 2022-08-08 NOTE — ED Provider Notes (Signed)
[facility] Provider Note    Event Date/Time   First MD Initiated Contact with Patient 08/08/22 1138     (approximate)   History   Nausea, Dizziness, and Headache   HPI  Morgan [name] is a 71 y.o. female   Past medical history of COPD end-stage on home O2, Parkinson's disease, hypertension, anxiety presents with chest pain, neck pain, headache, nausea and photophobia which has been constant and unchanged since her hospitalization earlier this week.  She was discharged a few days ago and has been at home with constant symptoms unchanged and unable to get out of bed due to pain.  She describes the chest pain central and radiating to the back of her neck and up to the bilateral frontal headache photophobia nausea and vomiting.  Denies cough or fever.  No abdominal pain.    No urinary symptoms.  Denies diarrhea.  No report of vision changes.  No reports of weakness or numbness in the extremities.  No reports of falls or trauma.  She said that the migraine cocktail she received at the hospital helped with the pain but was transient.  History was obtained via the patient, her brother at bedside, review of external medical notes.      Physical Exam   Triage Vital Signs: ED Triage Vitals  Enc Vitals Group     BP 08/08/22 1108 (!) 161/94     Pulse Rate 08/08/22 1108 100     Resp 08/08/22 1108 20     Temp 08/08/22 1108 (!) 97.5 F (36.4 C)     Temp Source 08/08/22 1108 Oral     SpO2 08/08/22 1108 100 %     Weight 08/08/22 1109 191 lb 12.8 oz (87 kg)     Height 08/08/22 1109 '6\' 1"'$  (1.854 m)     Head Circumference --      Peak Flow --      Pain Score 08/08/22 1108 10     Pain Loc --      Pain Edu? --      Excl. in [place]? --     Most recent vital signs: Vitals:   08/08/22 1329 08/08/22 1400  BP: (!) 179/93 (!) 164/91  Pulse: 73 74  Resp: 18 18  Temp: 98 F (36.7 C)   SpO2: 100% 100%    General: Awake, alert and conversant.  She appears  uncomfortable with sunglasses on in the room laying flat. CV:  Good peripheral perfusion.  Hypertensive 160/90, mildly tachycardic 100.  Heart sounds tachycardic, regular. Resp:  Normal effort.  Clear to auscultation. Abd:  No distention.  Nontender Other:  Moving all extremities, sensation intact, no facial asymmetry, no temporal tenderness, eye exam limited by patient's inability to open eyes due to photophobia.   ED Results / Procedures / Treatments   Labs (all labs ordered are listed, but only abnormal results are displayed) Labs Reviewed  BASIC METABOLIC PANEL - Abnormal; Notable for the following components:      Result Value   Potassium 3.4 (*)    Glucose, Bld 137 (*)    All other components within normal limits  CBC - Abnormal; Notable for the following components:   MCV 78.7 (*)    MCH 25.5 (*)    All other components within normal limits  URINALYSIS, ROUTINE W REFLEX MICROSCOPIC - Abnormal; Notable for the following components:   Color, Urine YELLOW (*)    APPearance HAZY (*)    Nitrite POSITIVE (*)  Leukocytes,Ua LARGE (*)    Bacteria, UA RARE (*)    All other components within normal limits  HEPATIC FUNCTION PANEL - Abnormal; Notable for the following components:   Albumin 3.3 (*)    AST 14 (*)    Alkaline Phosphatase 147 (*)    All other components within normal limits  BLOOD GAS, VENOUS - Abnormal; Notable for the following components:   pH, Ven 7.49 (*)    pO2, Ven <31 (*)    Bicarbonate 38.1 (*)    Acid-Base Excess 12.7 (*)    All other components within normal limits  URINE CULTURE  BRAIN NATRIURETIC PEPTIDE  TROPONIN I (HIGH SENSITIVITY)  TROPONIN I (HIGH SENSITIVITY)     I reviewed labs and they are notable for potassium 3.4, notably was low last admission.  EKG  ED ECG REPORT I, Lucillie Garfinkel, the attending physician, personally viewed and interpreted this ECG.   Date: 08/08/2022  EKG Time: 1109  Rate: 105  Rhythm: sinus tachycardia  Axis:  normal  Intervals:none  ST&T Change: no ischemia    RADIOLOGY I dependently reviewed and interpreted CT scan of the head without IV contrast and see no obvious hemorrhage or midline shift   PROCEDURES:  Critical Care performed: No  Procedures   MEDICATIONS ORDERED IN ED: Medications  cefTRIAXone (ROCEPHIN) 1 g in sodium chloride 0.9 % 100 mL IVPB (1 g Intravenous New Bag/Given 08/08/22 1524)  ondansetron (ZOFRAN-ODT) disintegrating tablet 4 mg (4 mg Oral Given 08/08/22 1114)  magnesium sulfate IVPB 2 g 50 mL (0 g Intravenous Stopped 08/08/22 1504)  diphenhydrAMINE (BENADRYL) injection 25 mg (25 mg Intravenous Given 08/08/22 1322)  prochlorperazine (COMPAZINE) injection 10 mg (10 mg Intravenous Given 08/08/22 1321)  acetaminophen (TYLENOL) tablet 650 mg (650 mg Oral Given 08/08/22 1322)  iohexol (OMNIPAQUE) 350 MG/ML injection 125 mL (125 mLs Intravenous Contrast Given 08/08/22 1440)    Consultants:  I spoke with hospitalist regarding admission and regarding care plan for this patient.   IMPRESSION / MDM / ASSESSMENT AND PLAN / ED COURSE  I reviewed the triage vital signs and the nursing notes.                              Differential diagnosis includes, but is not limited to, migraine headache, aortic dissection, vertebral or carotid dissection, intracranial bleeding, ACS, PE.   The patient is on the cardiac monitor to evaluate for evidence of arrhythmia and/or significant heart rate changes.  MDM: This is a patient with multiple comorbidities who presents with primary complaint of headache, neck pain, chest pain.  Of note she was in the hospital recently and the pain has been unchanged since that time.  No clear inciting events.  Could be a migraine, which was responsive to migraine cocktail in last hospitalization.  However with ongoing pain and description as above, concern for dissection of the vertebral or carotid arteries, also thoracic aortic aneurysm, will scan with CT  angiogram of head neck and chest.  Treat with migraine cocktail as well.  Check EKG and troponins for ACS.  Consider though less likely PE, although the dissection scan (though not the study of choice for PE) and assess for findings suspicious.  Work-up as above significant only for urinary tract infection. Admit for urinary tract infection.  Patient's presentation is most consistent with acute presentation with potential threat to life or bodily function.       FINAL CLINICAL  IMPRESSION(S) / ED DIAGNOSES   Final diagnoses:  Urinary tract infection without hematuria, site unspecified     Rx / DC Orders   ED Discharge Orders          Ordered    Pulse oximetry, continuous  Status:  Canceled        08/08/22 1244             Note:  This document was prepared using Dragon voice recognition software and may include unintentional dictation errors.    Lucillie Garfinkel, MD 08/08/22 [phone]

## 2022-08-08 NOTE — ED Triage Notes (Signed)
Pt to ED via [facility] with reports that she has been having nausea, dizziness and headache since last Thursday. Pt here with brother who reports that pt "passed out" in the lobby, pt was sitting in wheelchair and was A/O x 4 when brother stated this occurrence happened. Pt A/O x 4 in triage. Holding head and dry heaving. Wears 4L [place] cont at home. DNR with patient and on chart upon arrival.

## 2022-08-08 NOTE — Consult Note (Signed)
NEURO HOSPITALIST CONSULT NOTE   Requesting physician: Dr. Posey Pronto  Reason for Consult: Severe intractable migraine headache  History obtained from:   Patient and Chart     HPI:                                                                                                                                          Morgan [name] is an 71 y.o. female with a PMHx of migraine headaches, CHF, COPD, depression, HTN and Parkinson's disease (on Sinemet) who re-presents to [facility] with persistent migraine headache after recent admission and inptaient treatment for such. She states that opiate medication given last admission "took the edge off" but did not fully abort her headache.   Per Dr. Serita Grit admission note: "Morgan [name] is a 71 y.o. female with medical history significant of Parkinson's disease, complex migraines with aura, anxiety, COPD, Gerd, chronic anemia who follows with Dr. Gifford Shave neurology as outpatient was discharged recently after syncopal episode with essential benign workup. Patient comes in today with dry heaves and complains of headache more. She describes headache on a daily basis. Tells me she is been having it for last few months but pain is more today. She keeps her eyes covered with washcloth. Denies vomiting. Denies any weakness in upper and lower extremity focal. Has generalized weakness with anxiety. CT head/neck NGO no acute abnormality. CT angiogram of the chest no acute abnormality. Chronic changes as before. Patient tells me he that she takes about 6 to 7 Excedrin's over-the-counter. Patient was found to have abnormal urine. She denies any dysuria. She was received a dose of IV Rocephin. Patient denies any fever. She is independent and drives to her appointments. She lives with her roommate. Patient is on chronic 4 L nasal cannula oxygen for COPD."   At the time of Neurology evaluation this evening, her headache continues to be present despite  administration of 2000 mg IV magnesium sulfate, Reglan, Compazine and Imitrex tablet here in the hospital. She rates her headache as 7/10, throbbing, with photophobia and present along the left posterior neck and skull.     Past Medical History:  Diagnosis Date   Allergy    Anemia    Anxiety    Arthritis    CHF (congestive heart failure) (HCC)    COPD (chronic obstructive pulmonary disease) (HCC)    Depression    GERD (gastroesophageal reflux disease)    Hypertension    MVP (mitral valve prolapse)    Parkinson disease ([place])     Past Surgical History:  Procedure Laterality Date   ABDOMINAL HYSTERECTOMY     APPENDECTOMY     BALLOON DILATION N/A 04/21/2020   Procedure: BALLOON DILATION;  Surgeon: Milus Banister, MD;  Location: [facility] END[facility];  Service:  Endoscopy;  Laterality: N/A;  pyloric/ GI   BALLOON DILATION N/A 07/04/2020   Procedure: BALLOON DILATION;  Surgeon: Mauri Pole, MD;  Location: [facility];  Service: Endoscopy;  Laterality: N/A;   BIOPSY  04/21/2020   Procedure: BIOPSY;  Surgeon: Milus Banister, MD;  Location: [facility];  Service: Endoscopy;;   BIOPSY  07/04/2020   Procedure: BIOPSY;  Surgeon: Mauri Pole, MD;  Location: [facility];  Service: Endoscopy;;  EGD and COLON   BREAST SURGERY Right 1988   tumor removal   CHOLECYSTECTOMY N/A 05/18/2020   Procedure: LAPAROSCOPIC CHOLECYSTECTOMY;  Surgeon: Johnathan Hausen, MD;  Location: [facility];  Service: General;  Laterality: N/A;   COLONOSCOPY WITH PROPOFOL N/A 07/04/2020   Procedure: COLONOSCOPY WITH PROPOFOL;  Surgeon: Mauri Pole, MD;  Location: [facility];  Service: Endoscopy;  Laterality: N/A;   COLONOSCOPY WITH PROPOFOL N/A 11/03/2021   Procedure: COLONOSCOPY WITH PROPOFOL;  Surgeon: Lesly Rubenstein, MD;  Location: [facility] ENDOSCOPY;  Service: Endoscopy;  Laterality: N/A;   ESOPHAGEAL DILATION  08/11/2020   Procedure: PYLORIC DILATION;  Surgeon: Doran Stabler, MD;  Location: [facility];  Service: Gastroenterology;;   ESOPHAGOGASTRODUODENOSCOPY (EGD) WITH PROPOFOL N/A 04/21/2020   Procedure: ESOPHAGOGASTRODUODENOSCOPY (EGD) WITH PROPOFOL;  Surgeon: Milus Banister, MD;  [facility];  Service: Endoscopy;  Laterality: N/A;   ESOPHAGOGASTRODUODENOSCOPY (EGD) WITH PROPOFOL N/A 07/04/2020   Procedure: ESOPHAGOGASTRODUODENOSCOPY (EGD) WITH PROPOFOL;  Surgeon: Mauri Pole, MD;  [facility];  Service: Endoscopy;  Laterality: N/A;   ESOPHAGOGASTRODUODENOSCOPY (EGD) WITH PROPOFOL N/A 08/11/2020   Procedure: ESOPHAGOGASTRODUODENOSCOPY (EGD) WITH PROPOFOL;  Surgeon: Doran Stabler, MD;  [facility];  Service: Gastroenterology;  Laterality: N/A;   ESOPHAGOGASTRODUODENOSCOPY (EGD) WITH PROPOFOL N/A 11/03/2021   Procedure: ESOPHAGOGASTRODUODENOSCOPY (EGD) WITH PROPOFOL;  Surgeon: Lesly Rubenstein, MD;  Location: [facility] ENDOSCOPY;  Service: Endoscopy;  Laterality: N/A;   ESOPHAGOGASTRODUODENOSCOPY (EGD) WITH PROPOFOL N/A 05/07/2022   Procedure: ESOPHAGOGASTRODUODENOSCOPY (EGD) WITH PROPOFOL;  Surgeon: Lin Landsman, MD;  Location: [facility] ENDOSCOPY;  Service: Gastroenterology;  Laterality: N/A;   KNEE SURGERY  2005   2 times left   KNEE SURGERY Right    KYPHOPLASTY N/A 07/20/2021   Procedure: T 10KYPHOPLASTY;  Surgeon: Hessie Knows, MD;  Location: [facility] ORS;  Service: Orthopedics;  Laterality: N/A;   KYPHOPLASTY N/A 08/23/2021   Procedure: T9 KYPHOPLASTY;  Surgeon: Hessie Knows, MD;  Location: [facility] ORS;  Service: Orthopedics;  Laterality: N/A;   POLYPECTOMY  07/04/2020   Procedure: POLYPECTOMY;  Surgeon: Mauri Pole, MD;  [facility];  Service: Endoscopy;;   TUBAL LIGATION      Family History  Problem Relation Age of Onset   Breast cancer Mother 27   Ovarian cancer Mother 26   Brain cancer Mother    Hypertension Father    Hyperlipidemia Father    Heart disease Father    Colon cancer Father        diagnosed in his  39s   Breast cancer Maternal Grandmother 43   Diabetes Paternal Grandmother    Breast cancer Paternal Grandmother 25   Other Son        drug over dose   Colon cancer Maternal Grandfather        dx in his 26s   Liver disease Maternal Grandfather    Breast cancer Maternal Aunt 60   Breast cancer Maternal Aunt 60   Colon cancer Maternal Uncle    Stomach cancer Neg Hx  Pancreatic cancer Neg Hx    Esophageal cancer Neg Hx             Social History:  reports that she quit smoking about 4 years ago. Her smoking use included cigarettes. She has a 15.00 pack-year smoking history. She has never used smokeless tobacco. She reports that she does not currently use alcohol. She reports that she does not use drugs.  Allergies  Allergen Reactions   Meperidine Hives and Anaphylaxis   Strawberry Extract Hives and Swelling   Sucralfate Nausea Only and Other (See Comments)    Severe nausea Other reaction(s): Other (See Comments) Severe nausea Severe nausea   Wasp Venom Hives    MEDICATIONS:                                                                                                                     Prior to Admission:  Medications Prior to Admission  Medication Sig Dispense Refill Last Dose   carbidopa-levodopa (SINEMET IR) 25-100 MG tablet Take 2 tablets by mouth 3 (three) times daily.   08/08/2022   acetaminophen (TYLENOL) 325 MG tablet Take 2 tablets (650 mg total) by mouth every 4 (four) hours as needed for mild pain (or temp > 37.5 C (99.5 F)).   prn   AIMOVIG 140 MG/ML SOAJ SMARTSIG:140 Milligram(s) SUB-Q Once a Month (Patient not taking: Reported on 08/08/2022)   Not Taking   aspirin-acetaminophen-caffeine (EXCEDRIN MIGRAINE) 250-250-65 MG tablet Take 1-2 tablets by mouth every 6 (six) hours as needed for headache.   prn   Budeson-Glycopyrrol-Formoterol (BREZTRI AEROSPHERE) 160-9-4.8 MCG/ACT AERO Inhale 2 puffs into the lungs in the morning and at bedtime. (Patient not taking:  Reported on 08/08/2022) 10.7 g 3 Not Taking   Cholecalciferol 1.25 MG (50000 UT) TABS Take 1 tablet by mouth once a week. (Patient not taking: Reported on 08/08/2022) 4 tablet 1 Not Taking   diphenhydrAMINE (BENADRYL) 50 MG/ML injection Inject 0.25 mLs (12.5 mg total) into the vein every 8 (eight) hours as needed for up to 5 days (nausea vomiting). 3.75 mL 0    isosorbide mononitrate (IMDUR) 30 MG 24 hr tablet Take 30 mg by mouth daily. (Patient not taking: Reported on 08/08/2022)   Not Taking   KLOR-CON M20 20 MEQ tablet TAKE 1 TABLET BY MOUTH EVERY DAY (Patient not taking: Reported on 08/08/2022) 90 tablet 1 Not Taking   metoCLOPramide (REGLAN) 5 MG tablet Take 1 tablet (5 mg total) by mouth 4 (four) times daily -  before meals and at bedtime. (Patient not taking: Reported on 08/08/2022) 120 tablet 0 Not Taking   montelukast (SINGULAIR) 10 MG tablet Take 1 tablet (10 mg total) by mouth at bedtime. (Patient not taking: Reported on 08/08/2022) 30 tablet 11 Not Taking   OXYGEN Inhale 3 L/min into the lungs See admin instructions. 3 L/min at bedtime and as needed for shortness of breath throughout the day      pantoprazole (PROTONIX) 40 MG tablet Take 1 tablet (40 mg  total) by mouth 2 (two) times daily before a meal. (Patient not taking: Reported on 08/05/2022) 60 tablet 0    scopolamine (TRANSDERM-SCOP) 1 MG/3DAYS Place 1 patch (1.5 mg total) onto the skin every 3 (three) days. (Patient not taking: Reported on 08/08/2022) 10 patch 0 Not Taking   Scheduled:  carbidopa-levodopa  2 tablet Oral TID   cholecalciferol  1,000 Units Oral Weekly   docusate sodium  100 mg Oral BID   enoxaparin (LOVENOX) injection  40 mg Subcutaneous Q24H   isosorbide mononitrate  30 mg Oral Daily   labetalol       metoCLOPramide  5 mg Oral TID AC & HS   montelukast  10 mg Oral QHS   potassium chloride SA  20 mEq Oral Daily   Continuous:  cefTRIAXone (ROCEPHIN)  IV Stopped (08/08/22 1653)     ROS:                                                                                                                                        As per HPI. The patient is only able to participate with the basics of exam and interview questions due to severe migraine pain.    Blood pressure (!) 168/104, pulse 71, temperature 98.2 F (36.8 C), resp. rate 18, height '6\' 1"'$  (1.854 m), weight 87 kg, SpO2 100 %.   General Examination:                                                                                                       Physical Exam  HEENT-  [place]/AT    Lungs- Respirations unlaboed Extremities- No edema  Neurological Examination Mental Status: Awake, alert and oriented. Pained, dysthymic affect. Speech fluent with intact comprehension. No dysarthria.  Cranial Nerves: II: Patient elects to not have pupils and visual fields assessed due to exacerbation of her migraine pain.   III,IV, VI: Limited due to headache pain, but patient was able to briefly gaze to right and left conjugately.  V: Decreased temp sensation on the left.  VII: Mild chronic left facial droop VIII: Hearing intact to voice IX,X: No hypophonia or hoarseness XI: Head is midline XII: Midline tongue extension Motor: RUE and RLE 5/5 LUE and LLE 4 to 4+/5 Sensory: Decreased temp sensation to RUE and RLE Deep Tendon Reflexes: 2+ and symmetric throughout Plantars: Right: downgoing   Left: downgoing Cerebellar: No ataxia with FNF bilaterally Gait: Deferred   Lab Results: Basic Metabolic Panel: Recent Labs  Lab 08/03/22 1551 08/03/22 1838 08/03/22 2058 08/04/22 0643 08/05/22 0523 08/06/22 0524 08/08/22 1113  NA 138  --   --  139 139 139 138  K 2.6*  --   --  3.1* 3.2* 3.7 3.4*  CL 102  --   --  106 100 101 102  CO2 25  --   --  '28 31 31 24  '$ GLUCOSE 96  --   --  86 95 93 137*  BUN 18  --   --  '14 12 10 12  '$ CREATININE 0.95  --  0.83 0.81 0.76 0.72 0.95  CALCIUM 8.2*  --   --  8.1* 8.6* 8.5* 9.3  MG  --  1.9  --   --  2.0  --   --      CBC: Recent Labs  Lab 08/03/22 1551 08/03/22 2058 08/04/22 0643 08/08/22 1113  WBC 6.7 5.6 5.3 7.4  HGB 10.2* 9.9* 10.1* 12.2  HCT 32.6* 31.0* 31.8* 37.6  MCV 81.3 81.2 81.5 78.7*  PLT 156 206 205 363    Cardiac Enzymes: No results for input(s): "CKTOTAL", "CKMB", "CKMBINDEX", "TROPONINI" in the last 168 hours.  Lipid Panel: No results for input(s): "CHOL", "TRIG", "HDL", "CHOLHDL", "VLDL", "LDLCALC" in the last 168 hours.  Imaging: CT Angio Chest/Abd/Pel for Dissection W and/or Wo Contrast  Result Date: 08/08/2022 CLINICAL DATA:  Chest pain, back pain EXAM: CT ANGIOGRAPHY CHEST, ABDOMEN AND PELVIS TECHNIQUE: Non-contrast CT of the chest was initially obtained. Multidetector CT imaging through the chest, abdomen and pelvis was performed using the standard protocol during bolus administration of intravenous contrast. Multiplanar reconstructed images and MIPs were obtained and reviewed to evaluate the vascular anatomy. RADIATION DOSE REDUCTION: This exam was performed according to the departmental dose-optimization program which includes automated exposure control, adjustment of the mA and/or kV according to patient size and/or use of iterative reconstruction technique. CONTRAST:  13m OMNIPAQUE IOHEXOL 350 MG/ML SOLN COMPARISON:  CT abdomen and pelvis done on 08/03/2022 FINDINGS: CTA CHEST FINDINGS Cardiovascular: There is no evidence of mural hematoma in thoracic aorta in the noncontrast images. There is homogeneous enhancement in thoracic aorta. There is no demonstrable intimal flap. 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 There are scattered coronary artery calcifications. There are no intraluminal filling defects in pulmonary artery branches. Mediastinum/Nodes: No significant lymphadenopathy is seen. Lungs/Pleura: Centrilobular emphysema is seen. Small linear densities in the lung fields may suggest scarring or subsegmental atelectasis. There is  no focal consolidation. There is no pleural effusion or pneumothorax. Musculoskeletal: There is previous vertebroplasty in bodies of T9 and T10 vertebrae. Review of the MIP images confirms the above findings. CTA ABDOMEN AND PELVIS FINDINGS VASCULAR Aorta: There are scattered atherosclerotic plaques and calcifications. There is no demonstrable dissection. There is ectasia of the infrarenal aorta measuring 2.3 cm. No follow-up imaging is recommended. Celiac: No significant stenosis. SMA: No significant stenosis. Renals: There are scattered calcifications in renal arteries, particularly in the proximal left renal artery possibility of significant stenosis in the proximal left renal artery is not excluded. IMA: Patent. Inflow: There are scattered atherosclerotic plaques and calcifications in the iliac arteries without significant stenosis or dissection. Veins: Unremarkable. Review of the MIP images confirms the above findings. NON-VASCULAR Hepatobiliary: There is fatty infiltration. Liver measures 18.4 cm in length. No focal abnormalities are seen. Surgical clips are seen in gallbladder fossa. Pancreas: There is fatty infiltration. No focal abnormalities are seen. Spleen: Unremarkable. Adrenals/Urinary Tract: There  is mild hyperplasia of the adrenals. There are few small calcifications in the central portions of both kidneys, possibly nonobstructing stones are arterial calcifications. Ureters are not dilated. Urinary bladder is unremarkable. Stomach/Bowel: Small hiatal hernia is seen. Small bowel loops are not dilated. Appendix is not seen. There is mild diffuse wall thickening in colon Multiple diverticula are seen in the colon without signs of focal diverticulitis. Lymphatic: Unremarkable. Reproductive: Uterus is not seen. Other: There is no ascites or pneumoperitoneum. Small umbilical hernia containing fat is seen. Possible bilateral inguinal hernias containing fat are noted. Musculoskeletal: No recent fracture is  seen. Review of the MIP images confirms the above findings. IMPRESSION: There is no evidence of dissection in thoracic and abdominal aorta. Major branches off the thoracic and abdominal aorta appear patent. 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 Recommend annual imaging followup by CTA or MRA. This recommendation follows 2010 ACCF/AHA/AATS/ACR/ASA/SCA/SCAI/SIR/STS/SVM Guidelines for the Diagnosis and Management of Patients with Thoracic Aortic Disease. Circulation. 2010; 121: Q683-M196. Aortic aneurysm NOS (ICD10-I71.9) Coronary artery calcifications are seen. COPD. There is no focal pulmonary consolidation. There is no evidence of intestinal obstruction or pneumoperitoneum. There is no hydronephrosis. Enlarged fatty liver. Small hiatal hernia. Diverticulosis of colon without signs of focal acute diverticulitis. Mild diffuse wall thickening in colon which may be an artifact due to incomplete distention or suggest nonspecific chronic colitis. Other findings as described in the body of the report. Electronically Signed   By: Elmer Picker M.D.   On: 08/08/2022 14:58   CT ANGIO HEAD NECK W WO CM  Result Date: 08/08/2022 CLINICAL DATA:  Headache, concern for dissection EXAM: CT ANGIOGRAPHY HEAD AND NECK TECHNIQUE: Multidetector CT imaging of the head and neck was performed using the standard protocol during bolus administration of intravenous contrast. Multiplanar CT image reconstructions and MIPs were obtained to evaluate the vascular anatomy. Carotid stenosis measurements (when applicable) are obtained utilizing NASCET criteria, using the distal internal carotid diameter as the denominator. RADIATION DOSE REDUCTION: This exam was performed according to the departmental dose-optimization program which includes automated exposure control, adjustment of the mA and/or kV according to patient size and/or use of iterative reconstruction technique. CONTRAST:  11m OMNIPAQUE IOHEXOL 350 MG/ML  SOLN COMPARISON:  Same-day noncontrast CT head, CTA head and neck 01/18/2019 FINDINGS: CTA NECK FINDINGS Aortic arch: The imaged aortic arch is normal. There is mild calcified atherosclerotic plaque of the origins of the great vessels. The subclavian arteries are patent to the level imaged. Right carotid system: The right common carotid artery is patent. There is predominantly calcified plaque at the bifurcation resulting in up to approximately 40-50% stenosis. The distal right internal carotid artery is patent. The right external carotid artery is patent. There is no dissection or aneurysm. Left carotid system: The left common carotid artery is patent. There is predominantly calcified plaque in the proximal internal carotid artery resulting in approximately 20% stenosis. The distal left internal carotid artery is patent. The external carotid artery is patent. There is no dissection or aneurysm. Vertebral arteries: The vertebral arteries are patent, without hemodynamically significant stenosis or occlusion. There is no dissection or aneurysm. Skeleton: There is no acute osseous abnormality or suspicious osseous lesion. There is no visible canal hematoma. Other neck: The soft tissues of the neck are unremarkable. Upper chest: The lungs are assessed on the separately dictated CTA chest. Review of the MIP images confirms the above findings CTA HEAD FINDINGS Anterior circulation: The intracranial ICAs are patent with calcified  plaque resulting in mild-to-moderate stenosis of the left communicating segment and no significant stenosis on the right. The bilateral MCAs are patent without proximal stenosis or occlusion. There is a fenestration in the right M1 segment. The bilateral ACAs are patent without proximal stenosis or occlusion. There is no aneurysm or AVM. Posterior circulation: The bilateral V4 segments are patent. The basilar artery is patent. The major cerebellar artery origins are patent. The bilateral PCAs are  patent. Posterior communicating arteries are not identified. There is no aneurysm or AVM. Venous sinuses: Suboptimally evaluated due to bolus timing but grossly patent. Anatomic variants: None. Review of the MIP images confirms the above findings IMPRESSION: 1. Patent vasculature of the head and neck with no hemodynamically significant stenosis, occlusion, or dissection. 2. Calcified plaque of the carotid bifurcations resulting in approximately 40-50% stenosis on the right and 20% stenosis on the left. Widely patent vertebral arteries. 3. Noncalcified plaque in the intracranial ICAs resulting in mild-to-moderate stenosis of the left communicating segment. Otherwise patent intracranial vasculature. Electronically Signed   By: Valetta Mole M.D.   On: 08/08/2022 14:54   DG Chest Port 1 View  Result Date: 08/08/2022 CLINICAL DATA:  Short of breath, syncope EXAM: PORTABLE CHEST 1 VIEW COMPARISON:  Chest 07/12/2022 FINDINGS: Heart size and vascularity normal. Lungs are clear without infiltrate effusion. Apical scarring bilaterally. Cement vertebral augmentation lower thoracic spine at 2 levels unchanged from the prior study. IMPRESSION: Apical scarring bilaterally.  No acute cardiopulmonary abnormality Electronically Signed   By: Franchot Gallo M.D.   On: 08/08/2022 12:15   CT Head Wo Contrast  Result Date: 08/08/2022 CLINICAL DATA:  Syncope. EXAM: CT HEAD WITHOUT CONTRAST TECHNIQUE: Contiguous axial images were obtained from the base of the skull through the vertex without intravenous contrast. RADIATION DOSE REDUCTION: This exam was performed according to the departmental dose-optimization program which includes automated exposure control, adjustment of the mA and/or kV according to patient size and/or use of iterative reconstruction technique. COMPARISON:  CT head 08/03/2022 FINDINGS: Brain: No evidence of acute infarction, hemorrhage, hydrocephalus, extra-axial collection or mass lesion/mass effect. Vascular:  Negative for hyperdense vessel Skull: Negative Sinuses/Orbits: Retention cyst left maxillary sinus. Remaining sinuses clear. Negative orbit Other: None IMPRESSION: Negative CT head.  No change from the recent study. Electronically Signed   By: Franchot Gallo M.D.   On: 08/08/2022 11:58     Assessment: 71 year old female with a PMHx of migraine headaches, CHF, COPD, depression, HTN and Parkinson's disease (on Sinemet) who re-presents to [facility] with persistent migraine headache after recent admission and inptaient treatment for such.  - Exam reveals left sided weakness that patient states is chronic, secondary to a prior stroke. However, there is no history of stroke listed in Epic. - CT head is negative.  - CTA of head and neck: Patent vasculature of the head and neck with no hemodynamically significant stenosis, occlusion, or dissection. Calcified plaque of the carotid bifurcations resulting in approximately 40-50% stenosis on the right and 20% stenosis on the left. Widely patent vertebral arteries. Noncalcified plaque in the intracranial ICAs resulting in mild-to-moderate stenosis of the left communicating segment. Otherwise patent intracranial vasculature.  - Given high intake of Excedrin at home, analgesic rebound headache due to overuse of this medication is the most likely component of the DDx.  - Has failed attempts to treat with 5 medications this admission: 2000 mg IV magnesium sulfate, oral Reglan, IV Compazine, IV Benadryl and Imitrex tablet. - No fever, white count or  meningismus to suggest a meningitis.  - CTA has ruled out dissection   Recommendations: - Continue her home Sinemet - Discontinue her PRN Excedrin Migraine  - MRI brain to assess for a chronic lacunar infarct not visible on CT - Phenergan 12.5 mg IV x 1 (ordered)  Electronically signed: Dr. Kerney Elbe 08/08/2022, 9:39 PM

## 2022-08-08 NOTE — Telephone Encounter (Signed)
Patient called in stating that she was recently in the hospital for syncope. Has an appointment tomorrow but says she has been up all night vomiting, and feels like her head is being spilt in to two. Patient hung up before I could get her to access but did give access nurse all info to give her a call.

## 2022-08-09 ENCOUNTER — Inpatient Hospital Stay: Payer: Medicare HMO | Admitting: Family Medicine

## 2022-08-09 ENCOUNTER — Telehealth: Payer: Self-pay | Admitting: Pulmonary Disease

## 2022-08-09 ENCOUNTER — Encounter: Payer: Self-pay | Admitting: Internal Medicine

## 2022-08-09 ENCOUNTER — Observation Stay: Payer: Medicare HMO

## 2022-08-09 DIAGNOSIS — R519 Headache, unspecified: Secondary | ICD-10-CM | POA: Diagnosis not present

## 2022-08-09 DIAGNOSIS — G43719 Chronic migraine without aura, intractable, without status migrainosus: Secondary | ICD-10-CM | POA: Diagnosis not present

## 2022-08-09 MED ORDER — PROMETHAZINE HCL 25 MG PO TABS: 12.5000 mg | ORAL_TABLET | Freq: Three times a day (TID) | ORAL | Status: DC | PRN

## 2022-08-09 MED ORDER — PROMETHAZINE HCL 12.5 MG PO TABS: 12.5000 mg | ORAL_TABLET | Freq: Two times a day (BID) | ORAL | 0 refills | Status: DC | PRN

## 2022-08-09 MED ORDER — IBUPROFEN 400 MG PO TABS: 400.0000 mg | ORAL_TABLET | Freq: Three times a day (TID) | ORAL | 0 refills | Status: AC | PRN

## 2022-08-09 NOTE — Discharge Summary (Addendum)
Physician Discharge Summary   Patient: Morgan [name] MRN: [id number] DOB: 1951/08/02  Admit date:     08/08/2022  Discharge date: 08/09/22  Discharge Physician: Fritzi Mandes   PCP: [name], MD   Recommendations at discharge:    F/u Dr Manuella Ghazi neurlogy as out pt in 1-2 weeks  Discharge Diagnoses:  Chronic migraine headache  Hospital Course: Morgan [name] is a 71 y.o. female with medical history significant of Parkinson's disease, complex migraines with aura, anxiety, COPD, Gerd, chronic anemia who follows with Dr.Shah neurology as outpatient was discharged recently after syncopal episode with essential benign workup. Patient comes in today with dry heaves and complains of headache more. She describes headache on a daily basis. Tells me she is been having it for last few months but pain is more today.  Denies any syncopal episode or passing out.   Migraine with complex aura , acute on chronic -- patient has history of hemiplegic migraine with left hemipahresis -- per outpatient neurology notes by Dr. Manuella Ghazi -Per Dr Manuella Ghazi-- Continue Aimovig monthly injection - not taking, last injection was in June. Will consider Nurtec in future - would avoid triptans in setting of hemiplegic migraine . --Neurology consult with Dr Cheral Marker-- recommends MRI brian--negative for CVA, f/u with out pt neurology --prn ibuprofen  --CT head/neck angio--nothing acute --pt advised to avoid too much excedrin to avoid rebound HA --email staff message sent to dr Manuella Ghazi for f/u appt   Parkinson's disease -- continue Sinemet   COPD -- continue chronic oxygen -- sats hundred percent -- PRN bronchodilators   Morgan [name] -- new PPI   Hypertension -- blood pressure stable resume home meds   Per RN--no issues with ambulation      Consultants: neurology Disposition: Home Diet recommendation:  Discharge Diet Orders (From admission, onward)     Start     Ordered   08/09/22 0000  Diet - low sodium heart healthy         08/09/22 1150           Cardiac diet DISCHARGE MEDICATION: Allergies as of 08/09/2022       Reactions   Meperidine Hives, Anaphylaxis   Strawberry Extract Hives, Swelling   Sucralfate Nausea Only, Other (See Comments)   Severe nausea Other reaction(s): Other (See Comments) Severe nausea Severe nausea   Wasp Venom Hives        Medication List     STOP taking these medications    aspirin-acetaminophen-caffeine 250-250-65 MG tablet Commonly known as: EXCEDRIN MIGRAINE   Breztri Aerosphere 160-9-4.8 MCG/ACT Aero Generic drug: Budeson-Glycopyrrol-Formoterol   Cholecalciferol 1.25 MG (50000 UT) Tabs   diphenhydrAMINE 50 MG/ML injection Commonly known as: BENADRYL   Klor-Con M20 20 MEQ tablet Generic drug: potassium chloride SA   metoCLOPramide 5 MG tablet Commonly known as: REGLAN   montelukast 10 MG tablet Commonly known as: SINGULAIR   pantoprazole 40 MG tablet Commonly known as: PROTONIX   scopolamine 1 MG/3DAYS Commonly known as: TRANSDERM-SCOP       TAKE these medications    acetaminophen 325 MG tablet Commonly known as: TYLENOL Take 2 tablets (650 mg total) by mouth every 4 (four) hours as needed for mild pain (or temp > 37.5 C (99.5 F)).   Aimovig 140 MG/ML Soaj Generic drug: Erenumab-aooe SMARTSIG:140 Milligram(s) SUB-Q Once a Month   carbidopa-levodopa 25-100 MG tablet Commonly known as: SINEMET IR Take 2 tablets by mouth 3 (three) times daily.   ibuprofen 400 MG tablet Commonly known  as: ADVIL Take 1 tablet (400 mg total) by mouth every 8 (eight) hours as needed for headache or moderate pain.   isosorbide mononitrate 30 MG 24 hr tablet Commonly known as: IMDUR Take 30 mg by mouth daily.   OXYGEN Inhale 3 L/min into the lungs See admin instructions. 3 L/min at bedtime and as needed for shortness of breath throughout the day   promethazine 12.5 MG tablet Commonly known as: PHENERGAN Take 1 tablet (12.5 mg total) by mouth every  12 (twelve) hours as needed for nausea or vomiting.        Follow-up Information     [name], MD. Schedule an appointment as soon as possible for a visit in 1 week(s).   Specialty: Family Medicine Contact information: [place] [address] [phone]         Vladimir Crofts, MD. Schedule an appointment as soon as possible for a visit in 1 week(s).   Specialty: Neurology Why: migraine HA Contact information: [place] [facility] [place] [address] [phone]                Discharge Exam: Morgan [name] Weights   08/08/22 1109  Weight: 87 kg     Condition at discharge: fair  The results of significant diagnostics from this hospitalization (including imaging, microbiology, ancillary and laboratory) are listed below for reference.   Imaging Studies: MR BRAIN WO CONTRAST  Result Date: 08/09/2022 CLINICAL DATA:  Headache, uncomplicated EXAM: MRI HEAD WITHOUT CONTRAST TECHNIQUE: Multiplanar, multiecho pulse sequences of the brain and surrounding structures were obtained without intravenous contrast. COMPARISON:  CT head 08/08/2022 FINDINGS: Brain: No acute infarction, hemorrhage, hydrocephalus, extra-axial collection or mass lesion. Mild white matter changes with few small deep white matter hyperintensities bilaterally. Brainstem and cerebellum normal. Vascular: Normal arterial flow voids. Skull and upper cervical spine: No focal skeletal lesion. Sinuses/Orbits: Mild mucosal edema paranasal sinuses. Negative orbit Other: None IMPRESSION: No acute abnormality. Mild white matter changes which may be due to chronic microvascular ischemia Electronically Signed   By: Franchot Gallo M.D.   On: 08/09/2022 09:59   CT Angio Chest/Abd/Pel for Dissection W and/or Wo Contrast  Result Date: 08/08/2022 CLINICAL DATA:  Chest pain, back pain EXAM: CT ANGIOGRAPHY CHEST, ABDOMEN AND PELVIS TECHNIQUE: Non-contrast CT of the chest was  initially obtained. Multidetector CT imaging through the chest, abdomen and pelvis was performed using the standard protocol during bolus administration of intravenous contrast. Multiplanar reconstructed images and MIPs were obtained and reviewed to evaluate the vascular anatomy. RADIATION DOSE REDUCTION: This exam was performed according to the departmental dose-optimization program which includes automated exposure control, adjustment of the mA and/or kV according to patient size and/or use of iterative reconstruction technique. CONTRAST:  141m OMNIPAQUE IOHEXOL 350 MG/ML SOLN COMPARISON:  CT abdomen and pelvis done on 08/03/2022 FINDINGS: CTA CHEST FINDINGS Cardiovascular: There is no evidence of mural hematoma in thoracic aorta in the noncontrast images. There is homogeneous enhancement in thoracic aorta. There is no demonstrable intimal flap. 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 There are scattered coronary artery calcifications. There are no intraluminal filling defects in pulmonary artery branches. Mediastinum/Nodes: No significant lymphadenopathy is seen. Lungs/Pleura: Centrilobular emphysema is seen. Small linear densities in the lung fields may suggest scarring or subsegmental atelectasis. There is no focal consolidation. There is no pleural effusion or pneumothorax. Musculoskeletal: There is previous vertebroplasty in bodies of T9 and T10 vertebrae. Review  of the MIP images confirms the above findings. CTA ABDOMEN AND PELVIS FINDINGS VASCULAR Aorta: There are scattered atherosclerotic plaques and calcifications. There is no demonstrable dissection. There is ectasia of the infrarenal aorta measuring 2.3 cm. No follow-up imaging is recommended. Celiac: No significant stenosis. SMA: No significant stenosis. Renals: There are scattered calcifications in renal arteries, particularly in the proximal left renal artery possibility of significant stenosis in the proximal  left renal artery is not excluded. IMA: Patent. Inflow: There are scattered atherosclerotic plaques and calcifications in the iliac arteries without significant stenosis or dissection. Veins: Unremarkable. Review of the MIP images confirms the above findings. NON-VASCULAR Hepatobiliary: There is fatty infiltration. Liver measures 18.4 cm in length. No focal abnormalities are seen. Surgical clips are seen in gallbladder fossa. Pancreas: There is fatty infiltration. No focal abnormalities are seen. Spleen: Unremarkable. Adrenals/Urinary Tract: There is mild hyperplasia of the adrenals. There are few small calcifications in the central portions of both kidneys, possibly nonobstructing stones are arterial calcifications. Ureters are not dilated. Urinary bladder is unremarkable. Stomach/Bowel: Small hiatal hernia is seen. Small bowel loops are not dilated. Appendix is not seen. There is mild diffuse wall thickening in colon Multiple diverticula are seen in the colon without signs of focal diverticulitis. Lymphatic: Unremarkable. Reproductive: Uterus is not seen. Other: There is no ascites or pneumoperitoneum. Small umbilical hernia containing fat is seen. Possible bilateral inguinal hernias containing fat are noted. Musculoskeletal: No recent fracture is seen. Review of the MIP images confirms the above findings. IMPRESSION: There is no evidence of dissection in thoracic and abdominal aorta. Major branches off the thoracic and abdominal aorta appear patent. 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 Recommend annual imaging followup by CTA or MRA. This recommendation follows 2010 ACCF/AHA/AATS/ACR/ASA/SCA/SCAI/SIR/STS/SVM Guidelines for the Diagnosis and Management of Patients with Thoracic Aortic Disease. Circulation. 2010; 121: Y694-W546. Aortic aneurysm NOS (ICD10-I71.9) Coronary artery calcifications are seen. COPD. There is no focal pulmonary consolidation. There is no evidence of intestinal  obstruction or pneumoperitoneum. There is no hydronephrosis. Enlarged fatty liver. Small hiatal hernia. Diverticulosis of colon without signs of focal acute diverticulitis. Mild diffuse wall thickening in colon which may be an artifact due to incomplete distention or suggest nonspecific chronic colitis. Other findings as described in the body of the report. Electronically Signed   By: Elmer Picker M.D.   On: 08/08/2022 14:58   CT ANGIO HEAD NECK W WO CM  Result Date: 08/08/2022 CLINICAL DATA:  Headache, concern for dissection EXAM: CT ANGIOGRAPHY HEAD AND NECK TECHNIQUE: Multidetector CT imaging of the head and neck was performed using the standard protocol during bolus administration of intravenous contrast. Multiplanar CT image reconstructions and MIPs were obtained to evaluate the vascular anatomy. Carotid stenosis measurements (when applicable) are obtained utilizing NASCET criteria, using the distal internal carotid diameter as the denominator. RADIATION DOSE REDUCTION: This exam was performed according to the departmental dose-optimization program which includes automated exposure control, adjustment of the mA and/or kV according to patient size and/or use of iterative reconstruction technique. CONTRAST:  150m OMNIPAQUE IOHEXOL 350 MG/ML SOLN COMPARISON:  Same-day noncontrast CT head, CTA head and neck 01/18/2019 FINDINGS: CTA NECK FINDINGS Aortic arch: The imaged aortic arch is normal. There is mild calcified atherosclerotic plaque of the origins of the great vessels. The subclavian arteries are patent to the level imaged. Right carotid system: The right common carotid artery is patent. There is predominantly calcified plaque at the bifurcation resulting in up to approximately 40-50%  stenosis. The distal right internal carotid artery is patent. The right external carotid artery is patent. There is no dissection or aneurysm. Left carotid system: The left common carotid artery is patent. There is  predominantly calcified plaque in the proximal internal carotid artery resulting in approximately 20% stenosis. The distal left internal carotid artery is patent. The external carotid artery is patent. There is no dissection or aneurysm. Vertebral arteries: The vertebral arteries are patent, without hemodynamically significant stenosis or occlusion. There is no dissection or aneurysm. Skeleton: There is no acute osseous abnormality or suspicious osseous lesion. There is no visible canal hematoma. Other neck: The soft tissues of the neck are unremarkable. Upper chest: The lungs are assessed on the separately dictated CTA chest. Review of the MIP images confirms the above findings CTA HEAD FINDINGS Anterior circulation: The intracranial ICAs are patent with calcified plaque resulting in mild-to-moderate stenosis of the left communicating segment and no significant stenosis on the right. The bilateral MCAs are patent without proximal stenosis or occlusion. There is a fenestration in the right M1 segment. The bilateral ACAs are patent without proximal stenosis or occlusion. There is no aneurysm or AVM. Posterior circulation: The bilateral V4 segments are patent. The basilar artery is patent. The major cerebellar artery origins are patent. The bilateral PCAs are patent. Posterior communicating arteries are not identified. There is no aneurysm or AVM. Venous sinuses: Suboptimally evaluated due to bolus timing but grossly patent. Anatomic variants: None. Review of the MIP images confirms the above findings IMPRESSION: 1. Patent vasculature of the head and neck with no hemodynamically significant stenosis, occlusion, or dissection. 2. Calcified plaque of the carotid bifurcations resulting in approximately 40-50% stenosis on the right and 20% stenosis on the left. Widely patent vertebral arteries. 3. Noncalcified plaque in the intracranial ICAs resulting in mild-to-moderate stenosis of the left communicating segment.  Otherwise patent intracranial vasculature. Electronically Signed   By: Valetta Mole M.D.   On: 08/08/2022 14:54   DG Chest Port 1 View  Result Date: 08/08/2022 CLINICAL DATA:  Short of breath, syncope EXAM: PORTABLE CHEST 1 VIEW COMPARISON:  Chest 07/12/2022 FINDINGS: Heart size and vascularity normal. Lungs are clear without infiltrate effusion. Apical scarring bilaterally. Cement vertebral augmentation lower thoracic spine at 2 levels unchanged from the prior study. IMPRESSION: Apical scarring bilaterally.  No acute cardiopulmonary abnormality Electronically Signed   By: Franchot Gallo M.D.   On: 08/08/2022 12:15   CT Head Wo Contrast  Result Date: 08/08/2022 CLINICAL DATA:  Syncope. EXAM: CT HEAD WITHOUT CONTRAST TECHNIQUE: Contiguous axial images were obtained from the base of the skull through the vertex without intravenous contrast. RADIATION DOSE REDUCTION: This exam was performed according to the departmental dose-optimization program which includes automated exposure control, adjustment of the mA and/or kV according to patient size and/or use of iterative reconstruction technique. COMPARISON:  CT head 08/03/2022 FINDINGS: Brain: No evidence of acute infarction, hemorrhage, hydrocephalus, extra-axial collection or mass lesion/mass effect. Vascular: Negative for hyperdense vessel Skull: Negative Sinuses/Orbits: Retention cyst left maxillary sinus. Remaining sinuses clear. Negative orbit Other: None IMPRESSION: Negative CT head.  No change from the recent study. Electronically Signed   By: Franchot Gallo M.D.   On: 08/08/2022 11:58   CT ABDOMEN PELVIS W CONTRAST  Result Date: 08/03/2022 CLINICAL DATA:  Headache and nausea with loss of consciousness. EXAM: CT ABDOMEN AND PELVIS WITH CONTRAST TECHNIQUE: Multidetector CT imaging of the abdomen and pelvis was performed using the standard protocol following bolus administration of intravenous  contrast. RADIATION DOSE REDUCTION: This exam was performed  according to the departmental dose-optimization program which includes automated exposure control, adjustment of the mA and/or kV according to patient size and/or use of iterative reconstruction technique. CONTRAST:  173m OMNIPAQUE IOHEXOL 300 MG/ML  SOLN COMPARISON:  May 04, 2022 FINDINGS: Lower chest: No acute abnormality. Hepatobiliary: No focal liver abnormality is seen. Status post cholecystectomy. No biliary dilatation. Pancreas: Unremarkable. No pancreatic ductal dilatation or surrounding inflammatory changes. Spleen: Normal in size without focal abnormality. Adrenals/Urinary Tract: Adrenal glands are unremarkable. Kidneys are normal, without renal calculi, focal lesion, or hydronephrosis. Bladder is unremarkable. Stomach/Bowel: Stomach is within normal limits. The appendix is surgically absent. No evidence of bowel wall thickening, distention, or inflammatory changes. Noninflamed diverticula are seen throughout the sigmoid colon. Vascular/Lymphatic: Aortic atherosclerosis. No enlarged abdominal or pelvic lymph nodes. Reproductive: Status post hysterectomy. No adnexal masses. Other: No abdominal wall hernia or abnormality. No abdominopelvic ascites. Musculoskeletal: Degenerative changes are seen throughout the lumbar spine with evidence of prior vertebroplasty noted within the visualized portion of the lower thoracic spine. IMPRESSION: 1. Evidence of prior cholecystectomy and hysterectomy. 2. Sigmoid diverticulosis. 3. Aortic atherosclerosis. Aortic Atherosclerosis (ICD10-I70.0). Electronically Signed   By: TVirgina NorfolkM.D.   On: 08/03/2022 19:10   CT HEAD WO CONTRAST (5MM)  Result Date: 08/03/2022 CLINICAL DATA:  Headache and nausea with loss of consciousness. EXAM: CT HEAD WITHOUT CONTRAST TECHNIQUE: Contiguous axial images were obtained from the base of the skull through the vertex without intravenous contrast. RADIATION DOSE REDUCTION: This exam was performed according to the departmental  dose-optimization program which includes automated exposure control, adjustment of the mA and/or kV according to patient size and/or use of iterative reconstruction technique. COMPARISON:  January 19, 2022 FINDINGS: Brain: No evidence of acute infarction, hemorrhage, hydrocephalus, extra-axial collection or mass lesion/mass effect. Vascular: No hyperdense vessel or unexpected calcification. Skull: Normal. Negative for fracture or focal lesion. Sinuses/Orbits: No acute finding. Other: None. IMPRESSION: No acute intracranial process. Electronically Signed   By: TVirgina NorfolkM.D.   On: 08/03/2022 19:04   CT Chest High Resolution  Result Date: 07/22/2022 CLINICAL DATA:  Interstitial lung disease EXAM: CT CHEST WITHOUT CONTRAST TECHNIQUE: Multidetector CT imaging of the chest was performed following the standard protocol without intravenous contrast. High resolution imaging of the lungs, as well as inspiratory and expiratory imaging, was performed. RADIATION DOSE REDUCTION: This exam was performed according to the departmental dose-optimization program which includes automated exposure control, adjustment of the mA and/or kV according to patient size and/or use of iterative reconstruction technique. COMPARISON:  CT chest angiogram, 02/20/2021 FINDINGS: Cardiovascular: Aortic atherosclerosis. Enlargement of the tubular ascending thoracic aorta measuring up to 4.6 x 4.6 cm. Normal heart size. Scattered three-vessel coronary artery calcifications. No pericardial effusion. Mediastinum/Nodes: No enlarged mediastinal, hilar, or axillary lymph nodes. Thyroid gland, trachea, and esophagus demonstrate no significant findings. Lungs/Pleura: Mild fibrosis in a pattern with apical predominance, featuring irregular interstitial opacity and septal thickening with some associated ground-glass and architectural distortion. No significant air trapping on expiratory phase imaging. New nodular appearing consolidation of the  posterior left upper lobe abutting the fissure measuring 1.3 x 1.1 cm (series 5, image 75). Occasional additional stable, definitively benign small pulmonary nodules. No pleural effusion or pneumothorax. Upper Abdomen: No acute abnormality. Musculoskeletal: No chest wall abnormality. No suspicious osseous lesions identified. Vertebral cement augmentation of the T9 and T10 vertebral bodies. IMPRESSION: 1. Mild fibrosis in a pattern with apical predominance, featuring irregular interstitial opacity  and septal thickening with some associated ground-glass and architectural distortion. No significant air trapping on expiratory phase imaging. Findings are suggestive of an alternative diagnosis (not UIP) per consensus guidelines, primarily suggesting mild, chronic hypersensitivity pneumonitis or alternately nonspecific sequelae of prior infection or inflammation: Diagnosis of Idiopathic Pulmonary Fibrosis: An Official ATS/ERS/JRS/ALAT Clinical Practice Guideline. [place], Iss 5, [phone], Aug 17 2017. 2. New nodular appearing consolidation of the posterior left upper lobe abutting the fissure measuring 1.3 x 1.1 cm. This is most likely infectious or inflammatory although follow-up is recommended in 3 months to ensure stability or resolution. This recommendation follows the consensus statement: Guidelines for Management of Incidental Pulmonary Nodules Detected on CT Images: From the Fleischner Society 2017; Radiology 2017; 284:228-243. 3. Coronary artery disease. 4. Enlargement of the tubular ascending thoracic aorta measuring up to 4.6 x 4.6 cm. Ascending thoracic aortic aneurysm. Recommend semi-annual imaging followup by CTA or MRA and referral to cardiothoracic surgery if not already obtained. This recommendation follows 2010 ACCF/AHA/AATS/ACR/ASA/SCA/SCAI/SIR/STS/SVM Guidelines for the Diagnosis and Management of Patients With Thoracic Aortic Disease. Circulation. 2010; 121: S283-T517. Aortic  aneurysm NOS (ICD10-I71.9) Aortic Atherosclerosis (ICD10-I70.0). Electronically Signed   By: Delanna Ahmadi M.D.   On: 07/22/2022 13:08   DG Chest 2 View  Result Date: 07/12/2022 CLINICAL DATA:  71 year old female with history of shortness of breath, wheezing, cough and chest pain since 07/07/2022. EXAM: CHEST - 2 VIEW COMPARISON:  Chest x-ray 04/12/2022. FINDINGS: Lung volumes are normal. No acute consolidative airspace disease. No pleural effusions. Areas of mild architectural distortion are again noted in the right middle lobe and lingula, similar to the prior study, presumably areas of mild chronic post infectious or inflammatory scarring. Bilateral apical pleuroparenchymal thickening and architectural distortion is also similar to prior examinations, also likely areas of chronic post infectious or inflammatory scarring. No pneumothorax. No definite suspicious appearing pulmonary nodules or masses are noted. No evidence of pulmonary edema. Heart size is normal. Upper mediastinal contours are within normal limits. Post vertebroplasty changes are noted in the lower thoracic spine. IMPRESSION: 1. No radiographic evidence of acute cardiopulmonary disease. The appearance of the lungs is similar to prior studies, as above. Electronically Signed   By: Vinnie Langton M.D.   On: 07/12/2022 09:25    Microbiology: Results for orders placed or performed during the hospital encounter of 01/18/22  Resp Panel by RT-PCR (Flu A&B, Covid) Nasopharyngeal Swab     Status: None   Collection Time: 01/18/22 12:50 PM   Specimen: Nasopharyngeal Swab; Nasopharyngeal(NP) swabs in vial transport medium  Result Value Ref Range Status   SARS Coronavirus 2 by RT PCR NEGATIVE NEGATIVE Final    Comment: (NOTE) SARS-CoV-2 target nucleic acids are NOT DETECTED.  The SARS-CoV-2 RNA is generally detectable in upper respiratory specimens during the acute phase of infection. The lowest concentration of SARS-CoV-2 viral copies this  assay can detect is 138 copies/mL. A negative result does not preclude SARS-Cov-2 infection and should not be used as the sole basis for treatment or other patient management decisions. A negative result may occur with  improper specimen collection/handling, submission of specimen other than nasopharyngeal swab, presence of viral mutation(s) within the areas targeted by this assay, and inadequate number of viral copies(<138 copies/mL). A negative result must be combined with clinical observations, patient history, and epidemiological information. The expected result is Negative.  Fact Sheet for Patients:  EntrepreneurPulse.com.au  Fact Sheet for Healthcare Providers:  IncredibleEmployment.be  This test  is no t yet approved or cleared by the [place] and  has been authorized for detection and/or diagnosis of SARS-CoV-2 by FDA under an Emergency Use Authorization (EUA). This EUA will remain  in effect (meaning this test can be used) for the duration of the COVID-19 declaration under Section 564(b)(1) of the Act, 21 U.S.C.section 360bbb-3(b)(1), unless the authorization is terminated  or revoked sooner.       Influenza A by PCR NEGATIVE NEGATIVE Final   Influenza B by PCR NEGATIVE NEGATIVE Final    Comment: (NOTE) The Xpert Xpress SARS-CoV-2/FLU/RSV plus assay is intended as an aid in the diagnosis of influenza from Nasopharyngeal swab specimens and should not be used as a sole basis for treatment. Nasal washings and aspirates are unacceptable for Xpert Xpress SARS-CoV-2/FLU/RSV testing.  Fact Sheet for Patients: EntrepreneurPulse.com.au  Fact Sheet for Healthcare Providers: IncredibleEmployment.be  This test is not yet approved or cleared by the [place] FDA and has been authorized for detection and/or diagnosis of SARS-CoV-2 by FDA under an Emergency Use Authorization (EUA). This EUA will  remain in effect (meaning this test can be used) for the duration of the COVID-19 declaration under Section 564(b)(1) of the Act, 21 U.S.C. section 360bbb-3(b)(1), unless the authorization is terminated or revoked.  Performed at [facility], [address] [place], [place] [address]   Urine Culture     Status: Abnormal   Collection Time: 01/18/22 12:58 PM   Specimen: Urine, Clean Catch  Result Value Ref Range Status   Specimen Description   Final    URINE, CLEAN CATCH Performed at [facility], [address] [place], [place] [address]    Special Requests   Final    NONE Performed at [facility], [address], [place] [address]    Culture >=100,000 COLONIES/mL ESCHERICHIA COLI (A)  Final   Report Status 01/21/2022 FINAL  Final   Organism ID, Bacteria ESCHERICHIA COLI (A)  Final      Susceptibility   Escherichia coli - MIC*    AMPICILLIN 8 SENSITIVE Sensitive     CEFAZOLIN <=4 SENSITIVE Sensitive     CEFEPIME <=0.12 SENSITIVE Sensitive     CEFTRIAXONE <=0.25 SENSITIVE Sensitive     CIPROFLOXACIN <=0.25 SENSITIVE Sensitive     GENTAMICIN <=1 SENSITIVE Sensitive     IMIPENEM <=0.25 SENSITIVE Sensitive     NITROFURANTOIN <=16 SENSITIVE Sensitive     TRIMETH/SULFA <=20 SENSITIVE Sensitive     AMPICILLIN/SULBACTAM 4 SENSITIVE Sensitive     PIP/TAZO <=4 SENSITIVE Sensitive     * >=100,000 COLONIES/mL ESCHERICHIA COLI  MRSA Next Gen by PCR, Nasal     Status: None   Collection Time: 01/18/22  4:59 PM   Specimen: Nasal Mucosa; Nasal Swab  Result Value Ref Range Status   MRSA by PCR Next Gen NOT DETECTED NOT DETECTED Final    Comment: (NOTE) The GeneXpert MRSA Assay (FDA approved for NASAL specimens only), is one component of a comprehensive MRSA colonization surveillance program. It is not intended to diagnose MRSA infection nor to guide or monitor treatment for MRSA infections. Test performance is not FDA approved in patients less  than 4 years old. Performed at [facility], [address] [place],  [address]   Resp Panel by RT-PCR (Flu A&B, Covid) Nasopharyngeal Swab     Status: None   Collection Time: 01/30/22 11:16 AM   Specimen: Nasopharyngeal Swab; Nasopharyngeal(NP) swabs in vial transport medium  Result Value  Ref Range Status   SARS Coronavirus 2 by RT PCR NEGATIVE NEGATIVE Final    Comment: (NOTE) SARS-CoV-2 target nucleic acids are NOT DETECTED.  The SARS-CoV-2 RNA is generally detectable in upper respiratory specimens during the acute phase of infection. The lowest concentration of SARS-CoV-2 viral copies this assay can detect is 138 copies/mL. A negative result does not preclude SARS-Cov-2 infection and should not be used as the sole basis for treatment or other patient management decisions. A negative result may occur with  improper specimen collection/handling, submission of specimen other than nasopharyngeal swab, presence of viral mutation(s) within the areas targeted by this assay, and inadequate number of viral copies(<138 copies/mL). A negative result must be combined with clinical observations, patient history, and epidemiological information. The expected result is Negative.  Fact Sheet for Patients:  EntrepreneurPulse.com.au  Fact Sheet for Healthcare Providers:  IncredibleEmployment.be  This test is no t yet approved or cleared by the [place] FDA and  has been authorized for detection and/or diagnosis of SARS-CoV-2 by FDA under an Emergency Use Authorization (EUA). This EUA will remain  in effect (meaning this test can be used) for the duration of the COVID-19 declaration under Section 564(b)(1) of the Act, 21 U.S.C.section 360bbb-3(b)(1), unless the authorization is terminated  or revoked sooner.       Influenza A by PCR NEGATIVE NEGATIVE Final   Influenza B by PCR NEGATIVE NEGATIVE Final    Comment: (NOTE) The Xpert  Xpress SARS-CoV-2/FLU/RSV plus assay is intended as an aid in the diagnosis of influenza from Nasopharyngeal swab specimens and should not be used as a sole basis for treatment. Nasal washings and aspirates are unacceptable for Xpert Xpress SARS-CoV-2/FLU/RSV testing.  Fact Sheet for Patients: EntrepreneurPulse.com.au  Fact Sheet for Healthcare Providers: IncredibleEmployment.be  This test is not yet approved or cleared by the [place] FDA and has been authorized for detection and/or diagnosis of SARS-CoV-2 by FDA under an Emergency Use Authorization (EUA). This EUA will remain in effect (meaning this test can be used) for the duration of the COVID-19 declaration under Section 564(b)(1) of the Act, 21 U.S.C. section 360bbb-3(b)(1), unless the authorization is terminated or revoked.  Performed at [facility] [address] [place], [place] [address]     Labs: CBC: Recent Labs  Lab 08/03/22 1551 08/03/22 2058 08/04/22 0643 08/08/22 1113  WBC 6.7 5.6 5.3 7.4  HGB 10.2* 9.9* 10.1* 12.2  HCT 32.6* 31.0* 31.8* 37.6  MCV 81.3 81.2 81.5 78.7*  PLT 156 206 205 097   Basic Metabolic Panel: Recent Labs  Lab 08/03/22 1551 08/03/22 1838 08/03/22 2058 08/04/22 0643 08/05/22 0523 08/06/22 0524 08/08/22 1113  NA 138  --   --  139 139 139 138  K 2.6*  --   --  3.1* 3.2* 3.7 3.4*  CL 102  --   --  106 100 101 102  CO2 25  --   --  '28 31 31 24  '$ GLUCOSE 96  --   --  86 95 93 137*  BUN 18  --   --  '14 12 10 12  '$ CREATININE 0.95  --  0.83 0.81 0.76 0.72 0.95  CALCIUM 8.2*  --   --  8.1* 8.6* 8.5* 9.3  MG  --  1.9  --   --  2.0  --   --    Liver Function Tests: Recent Labs  Lab 08/04/22 0643 08/08/22 1113  AST 9* 14*  ALT <5 <5  ALKPHOS 108 147*  BILITOT 0.5 0.7  PROT 5.3* 6.7  ALBUMIN 2.7* 3.3*   CBG: Recent Labs  Lab 08/05/22 0529 08/06/22 0438  GLUCAP 96 87    Discharge time spent: greater than 30  minutes.  Signed: Fritzi Mandes, MD [facility] 08/09/2022

## 2022-08-09 NOTE — Plan of Care (Signed)

## 2022-08-09 NOTE — Telephone Encounter (Signed)
Attempted to call pt but unable to reach. Left message for her to return call. Due to multiple attempts trying to reach pt without being able to do so, per protocol encounter will be closed. ?

## 2022-08-09 NOTE — Telephone Encounter (Signed)
Called patient and she states that for several days she felt like she could not breath or catch her breath and that her chest was hurting. She states on the phone call that she is admitted at [facility] informed Dr Silas Flood 8/24 that she was admitted. He pulled up chart and looked and gave no new orders for patient. Told patient to follow up after getting discharged out of hospital to follow up in office with Dr Silas Flood. Nothing further needed

## 2022-08-09 NOTE — Telephone Encounter (Signed)
Pt was rescheduled

## 2022-08-10 ENCOUNTER — Ambulatory Visit: Payer: Medicare HMO

## 2022-08-10 DIAGNOSIS — R55 Syncope and collapse: Secondary | ICD-10-CM | POA: Diagnosis not present

## 2022-08-10 DIAGNOSIS — G4752 REM sleep behavior disorder: Secondary | ICD-10-CM | POA: Diagnosis not present

## 2022-08-10 DIAGNOSIS — G20 Parkinson's disease: Secondary | ICD-10-CM | POA: Diagnosis not present

## 2022-08-10 DIAGNOSIS — G43419 Hemiplegic migraine, intractable, without status migrainosus: Secondary | ICD-10-CM | POA: Diagnosis not present

## 2022-08-10 LAB — URINE CULTURE
Culture: >=100000 — AB
Special Requests: NORMAL

## 2022-08-13 ENCOUNTER — Other Ambulatory Visit: Payer: Self-pay | Admitting: Family Medicine

## 2022-08-13 MED ORDER — PROMETHAZINE HCL 12.5 MG PO TABS: 12.5000 mg | ORAL_TABLET | Freq: Two times a day (BID) | ORAL | 0 refills | Status: DC | PRN

## 2022-08-13 NOTE — Telephone Encounter (Signed)
Refill provided. Make sure patient is not taking more than 2 pills per day.

## 2022-08-13 NOTE — Telephone Encounter (Signed)
Patient called in and was wondering if Dr. Einar Pheasant could call in a prescription for promethazine (PHENERGAN) 12.5 MG tablet. She stated she is out now and that seem to be the only thing that works. Thank you!

## 2022-08-13 NOTE — Telephone Encounter (Signed)
Last filled 08/09/22 #10 with 0

## 2022-08-15 ENCOUNTER — Ambulatory Visit (INDEPENDENT_AMBULATORY_CARE_PROVIDER_SITE_OTHER): Payer: Medicare HMO | Admitting: Family Medicine

## 2022-08-15 ENCOUNTER — Encounter: Payer: Self-pay | Admitting: Family Medicine

## 2022-08-15 VITALS — BP 146/76 | HR 56 | Temp 97.6°F | Ht 73.0 in | Wt 193.0 lb

## 2022-08-15 DIAGNOSIS — J449 Chronic obstructive pulmonary disease, unspecified: Secondary | ICD-10-CM | POA: Diagnosis not present

## 2022-08-15 DIAGNOSIS — J069 Acute upper respiratory infection, unspecified: Secondary | ICD-10-CM | POA: Insufficient documentation

## 2022-08-15 LAB — POC COVID19 BINAXNOW: SARS Coronavirus 2 Ag: NEGATIVE

## 2022-08-15 MED ORDER — BENZONATATE 200 MG PO CAPS: 200.0000 mg | ORAL_CAPSULE | Freq: Three times a day (TID) | ORAL | 0 refills | Status: DC | PRN

## 2022-08-15 MED ORDER — PREDNISONE 10 MG PO TABS: ORAL_TABLET | ORAL | 0 refills | Status: DC

## 2022-08-15 NOTE — Telephone Encounter (Signed)
Pt confirmed that she is taking med as prescribed one tab every 12 hrs (2 per day Max)  FYI to PCP

## 2022-08-15 NOTE — Assessment & Plan Note (Signed)
Negative rapid covid test here Has copd with some inc wheeze Px prednisone 40 mg taper Has albuterol mdi Disc sympt control/see AVS Fluids and rest  Tessalon for addn cough control  ER precautions

## 2022-08-15 NOTE — Patient Instructions (Signed)
Take prednisone as directed for breathing/wheezing Use your albuterol inhaler as needed  Drink fluids   Drink fluids and rest  mucinex DM is good for cough and congestion  Nasal saline for congestion as needed  Tylenol for fever or pain or headache  Please alert [place] if symptoms worsen (if severe or short of breath please go to the ER)

## 2022-08-15 NOTE — Assessment & Plan Note (Signed)
Some inc wheeze with uri  Prednisone 40 mg taper px Pt has albuterol  ER precautions noted

## 2022-08-15 NOTE — Progress Notes (Signed)
Subjective:    Patient ID: Morgan [name], female    DOB: [birth year], 71 y.o.   MRN: [id number]  HPI 71 yo pt of Dr Einar Pheasant presents with uri symptoms  She has h/o copd and stroke in past as well as chronic headache    Wt Readings from Last 3 Encounters:  08/15/22 193 lb (87.5 kg)  08/08/22 191 lb 12.8 oz (87 kg)  08/06/22 192 lb 7.4 oz (87.3 kg)   25.46 kg/m  Cold symptoms  Day 3  Headaches - history of them   Congestion  Stuffy nose  Sneezing   No sore throat Ears ring-no pain    Cough - dry cough /no phlegm and no rattle so far No wheezing  No change in sob   No fever  No chills  No new body aches   Used to smoke Quit 9 years ago  Has rescue inhaler as needed   Has not done a covid test  No known contacts  Has been immunized   Pulse ox is 98% today   Otc:  Has new medicines and does not know what is safe    Some ibuprofen Tylenol   Results for orders placed or performed in visit on 08/15/22  POC COVID-19 BinaxNow  Result Value Ref Range   SARS Coronavirus 2 Ag Negative Negative    Patient Active Problem List   Diagnosis Date Noted   Viral URI with cough 08/15/2022   Migraine headache 08/08/2022   Syncope and collapse 08/03/2022   Parkinson disease ([place])    Normocytic anemia    Hemiplegic migraine, not intractable, 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   Vitamin B12 deficiency 02/20/2022   Vitamin D deficiency 02/20/2022   Hemiplegic migraine 01/24/2022   Acute ischemic stroke ([place]) 01/18/2022   Edema, unspecified 01/10/2022   Vascular dementia, unspecified severity, with anxiety ([place]) 01/10/2022   Chronic back pain 01/10/2022   Stage 3b chronic kidney disease ([place]) 01/09/2022   Anemia 01/09/2022   Tremor 10/27/2021   Acute on chronic diastolic CHF (congestive heart failure), NYHA class 3 ([place]) 10/25/2021   Microcytic anemia 09/11/2021   Stage 3a chronic kidney disease ([place]) 09/11/2021   IDA (iron deficiency anemia) 09/11/2021   Elevated  TSH 08/28/2021   Osteopenia 08/28/2021   DOE (dyspnea on exertion) 08/28/2021   Chronic diastolic CHF (congestive heart failure) ([place]) 08/09/2020   COPD (chronic obstructive pulmonary disease) ([place]) 08/09/2020   Chronic respiratory failure with hypoxia ([place]) 08/09/2020   Polyp of sigmoid colon    Polyp of cecum    Gastritis and gastroduodenitis    Pylorus ulcer    Pyloric stenosis in adult    Chronic cholecystitis 05/19/2020   Oxygen dependent 03/30/2020   Nausea and vomiting in adult 03/22/2020   Peptic ulcer disease 03/22/2020   Iron deficiency anemia 03/22/2020   Chronic abdominal pain 02/19/2019   CKD (chronic kidney disease) stage 3, GFR 30-59 ml/min ([place]) 01/13/2019   CHF (congestive heart failure) ([place]) 01/13/2019   Heart failure, unspecified ([place]) 01/13/2019   Hoarseness 11/12/2018   Sleepiness 11/12/2018   Oropharyngeal dysphagia 11/12/2018   Drowsy 11/12/2018   Cerebrovascular accident ([place]) 08/22/2018   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   Non-rheumatic mitral regurgitation 11/21/2017   Left-sided weakness 10/04/2017   HLD (hyperlipidemia) 10/04/2017   GERD (gastroesophageal reflux disease) 10/04/2017   Hyperlipidemia, unspecified 10/04/2017   Left hemiparesis ([place]) 10/04/2017   Gastroesophageal reflux disease 10/04/2017   Anxiety 08/26/2017   Essential hypertension  Depression    Hypokalemia 02/22/2015   Chronic lower back pain 04/29/2013   Paresthesia 04/29/2013   Past Medical History:  Diagnosis Date   Allergy    Anemia    Anxiety    Arthritis    CHF (congestive heart failure) (HCC)    COPD (chronic obstructive pulmonary disease) (HCC)    Depression    GERD (gastroesophageal reflux disease)    Hypertension    MVP (mitral valve prolapse)    Parkinson disease ([place])    Past Surgical History:  Procedure Laterality Date   ABDOMINAL HYSTERECTOMY     APPENDECTOMY     BALLOON DILATION N/A 04/21/2020   Procedure: BALLOON DILATION;  Surgeon:  Milus Banister, MD;  Location: [facility];  Service: Endoscopy;  Laterality: N/A;  pyloric/ GI   BALLOON DILATION N/A 07/04/2020   Procedure: BALLOON DILATION;  Surgeon: Mauri Pole, MD;  Location: [facility];  Service: Endoscopy;  Laterality: N/A;   BIOPSY  04/21/2020   Procedure: BIOPSY;  Surgeon: Milus Banister, MD;  Location: [facility];  Service: Endoscopy;;   BIOPSY  07/04/2020   Procedure: BIOPSY;  Surgeon: Mauri Pole, MD;  Location: [facility];  Service: Endoscopy;;  EGD and COLON   BREAST SURGERY Right 1988   tumor removal   CHOLECYSTECTOMY N/A 05/18/2020   Procedure: LAPAROSCOPIC CHOLECYSTECTOMY;  Surgeon: Johnathan Hausen, MD;  Location: [facility];  Service: General;  Laterality: N/A;   COLONOSCOPY WITH PROPOFOL N/A 07/04/2020   Procedure: COLONOSCOPY WITH PROPOFOL;  Surgeon: Mauri Pole, MD;  Location: [facility];  Service: Endoscopy;  Laterality: N/A;   COLONOSCOPY WITH PROPOFOL N/A 11/03/2021   Procedure: COLONOSCOPY WITH PROPOFOL;  Surgeon: Lesly Rubenstein, MD;  Location: [facility] ENDOSCOPY;  Service: Endoscopy;  Laterality: N/A;   ESOPHAGEAL DILATION  08/11/2020   Procedure: PYLORIC DILATION;  Surgeon: Doran Stabler, MD;  Location: [facility];  Service: Gastroenterology;;   ESOPHAGOGASTRODUODENOSCOPY (EGD) WITH PROPOFOL N/A 04/21/2020   Procedure: ESOPHAGOGASTRODUODENOSCOPY (EGD) WITH PROPOFOL;  Surgeon: Milus Banister, MD;  [facility];  Service: Endoscopy;  Laterality: N/A;   ESOPHAGOGASTRODUODENOSCOPY (EGD) WITH PROPOFOL N/A 07/04/2020   Procedure: ESOPHAGOGASTRODUODENOSCOPY (EGD) WITH PROPOFOL;  Surgeon: Mauri Pole, MD;  [facility];  Service: Endoscopy;  Laterality: N/A;   ESOPHAGOGASTRODUODENOSCOPY (EGD) WITH PROPOFOL N/A 08/11/2020   Procedure: ESOPHAGOGASTRODUODENOSCOPY (EGD) WITH PROPOFOL;  Surgeon: Doran Stabler, MD;  [facility];  Service: Gastroenterology;  Laterality: N/A;    ESOPHAGOGASTRODUODENOSCOPY (EGD) WITH PROPOFOL N/A 11/03/2021   Procedure: ESOPHAGOGASTRODUODENOSCOPY (EGD) WITH PROPOFOL;  Surgeon: Lesly Rubenstein, MD;  Location: [facility] END[facility];  Service: Endoscopy;  Laterality: N/A;   ESOPHAGOGASTRODUODENOSCOPY (EGD) WITH PROPOFOL N/A 05/07/2022   Procedure: ESOPHAGOGASTRODUODENOSCOPY (EGD) WITH PROPOFOL;  Surgeon: Lin Landsman, MD;  Location[facility] ENDOSCOPY;  Service: Gastroenterology;  Laterality: N/A;   KNEE SURGERY  2005   2 times left   KNEE SURGERY Right    KYPHOPLASTY N/A 07/20/2021   Procedure: T 10KYPHOPLASTY;  Surgeon: Hessie Knows, MD;  Location: [facility];  Service: Orthopedics;  Laterality: N/A;   KYPHOPLASTY N/A 08/23/2021   Procedure: T9 KYPHOPLASTY;  Surgeon: Hessie Knows, MD;  Location: [facility];  Service: Orthopedics;  Laterality: N/A;   POLYPECTOMY  07/04/2020   Procedure: POLYPECTOMY;  Surgeon: Mauri Pole, MD;  [facility];  Service: Endoscopy;;   TUBAL LIGATION     Social History   Tobacco Use   Smoking status: Former    Packs/day: 1.00  Years: 15.00    Total pack years: 15.00    Types: Cigarettes    Quit date: 09/12/2017    Years since quitting: 4.9   Smokeless tobacco: Never  Vaping Use   Vaping Use: Never used  Substance Use Topics   Alcohol use: Not Currently   Drug use: No   Family History  Problem Relation Age of Onset   Breast cancer Mother 17   Ovarian cancer Mother 17   Brain cancer Mother    Hypertension Father    Hyperlipidemia Father    Heart disease Father    Colon cancer Father        diagnosed in his 22s   Breast cancer Maternal Grandmother 74   Diabetes Paternal Grandmother    Breast cancer Paternal Grandmother 80   Other Son        drug over dose   Colon cancer Maternal Grandfather        dx in his 36s   Liver disease Maternal Grandfather    Breast cancer Maternal Aunt 60   Breast cancer Maternal Aunt 60   Colon cancer Maternal Uncle    Stomach cancer Neg Hx     Pancreatic cancer Neg Hx    Esophageal cancer Neg Hx    Allergies  Allergen Reactions   Meperidine Hives and Anaphylaxis   Strawberry Extract Hives and Swelling   Sucralfate Nausea Only and Other (See Comments)    Severe nausea Other reaction(s): Other (See Comments) Severe nausea Severe nausea   Wasp Venom Hives   Current Outpatient Medications on File Prior to Visit  Medication Sig Dispense Refill   acetaminophen (TYLENOL) 325 MG tablet Take 2 tablets (650 mg total) by mouth every 4 (four) hours as needed for mild pain (or temp > 37.5 C (99.5 F)).     AIMOVIG 140 MG/ML SOAJ      carbidopa-levodopa (SINEMET IR) 25-100 MG tablet Take 2 tablets by mouth 3 (three) times daily.     ibuprofen (ADVIL) 400 MG tablet Take 1 tablet (400 mg total) by mouth every 8 (eight) hours as needed for headache or moderate pain. 20 tablet 0   OXYGEN Inhale 3 L/min into the lungs See admin instructions. 3 L/min at bedtime and as needed for shortness of breath throughout the day     promethazine (PHENERGAN) 12.5 MG tablet Take 1 tablet (12.5 mg total) by mouth every 12 (twelve) hours as needed for nausea or vomiting. 20 tablet 0   topiramate (TOPAMAX) 25 MG tablet Take 1 tablet by mouth at bedtime.     No current facility-administered medications on file prior to visit.    Review of Systems  Constitutional:  Positive for fatigue. Negative for chills and fever.  HENT:  Positive for congestion, postnasal drip, rhinorrhea, sinus pressure, sneezing and sore throat. Negative for ear pain.   Eyes:  Negative for pain and discharge.  Respiratory:  Positive for cough. Negative for shortness of breath, wheezing and stridor.   Cardiovascular:  Negative for chest pain.  Gastrointestinal:  Negative for diarrhea, nausea and vomiting.  Genitourinary:  Negative for frequency, hematuria and urgency.  Musculoskeletal:  Negative for arthralgias and myalgias.  Skin:  Negative for rash.  Neurological:  Positive for  headaches. Negative for dizziness, weakness and light-headedness.  Psychiatric/Behavioral:  Negative for confusion and dysphoric mood.        Objective:   Physical Exam Constitutional:      General: She is not in acute distress.  Appearance: Normal appearance. She is well-developed and normal weight. She is not ill-appearing, toxic-appearing or diaphoretic.  HENT:     Head: Normocephalic and atraumatic.     Comments: Nares are injected and congested    No facial swelling  Some frontal sinus tenderness    Right Ear: Tympanic membrane, ear canal and external ear normal.     Left Ear: Tympanic membrane, ear canal and external ear normal.     Nose: Congestion and rhinorrhea present.     Mouth/Throat:     Mouth: Mucous membranes are moist.     Pharynx: Oropharynx is clear. No oropharyngeal exudate or posterior oropharyngeal erythema.     Comments: Clear pnd  Eyes:     General:        Right eye: No discharge.        Left eye: No discharge.     Conjunctiva/sclera: Conjunctivae normal.     Pupils: Pupils are equal, round, and reactive to light.  Cardiovascular:     Rate and Rhythm: Bradycardia present.     Heart sounds: Normal heart sounds.  Pulmonary:     Effort: Pulmonary effort is normal. No respiratory distress.     Breath sounds: Normal breath sounds. No stridor. No wheezing, rhonchi or rales.     Comments: Diffusely distant bss Scattered rhonchi Some mild end exp wheezes No rales or crackles  Chest:     Chest wall: No tenderness.  Musculoskeletal:     Cervical back: Normal range of motion and neck supple.  Lymphadenopathy:     Cervical: No cervical adenopathy.  Skin:    General: Skin is warm and dry.     Capillary Refill: Capillary refill takes less than 2 seconds.     Findings: No rash.  Neurological:     Mental Status: She is alert.     Cranial Nerves: No cranial nerve deficit.  Psychiatric:        Mood and Affect: Mood normal.           Assessment &  Plan:   Problem List Items Addressed This Visit       Respiratory   COPD (chronic obstructive pulmonary disease) ([place])    Some inc wheeze with uri  Prednisone 40 mg taper px Pt has albuterol  ER precautions noted      Relevant Medications   predniSONE (DELTASONE) 10 MG tablet   benzonatate (TESSALON) 200 MG capsule   Viral URI with cough - Primary    Negative rapid covid test here Has copd with some inc wheeze Px prednisone 40 mg taper Has albuterol mdi Disc sympt control/see AVS Fluids and rest  Tessalon for addn cough control  ER precautions        Relevant Orders   POC COVID-19 BinaxNow (Completed)

## 2022-08-16 ENCOUNTER — Telehealth: Payer: Self-pay | Admitting: Pulmonary Disease

## 2022-08-16 ENCOUNTER — Inpatient Hospital Stay: Payer: Medicare HMO | Admitting: Family Medicine

## 2022-08-17 ENCOUNTER — Telehealth: Payer: Self-pay | Admitting: *Deleted

## 2022-08-17 NOTE — Telephone Encounter (Signed)
Has anyone seen an order for this on a CMN? Patient said we  were getting a fax for the water bottle? Please advise.

## 2022-08-17 NOTE — Patient Outreach (Signed)
  Care Coordination [facility] Note Transition Care Management Follow-up Telephone Call Date of discharge and from where: [facility] [phone] How have you been since you were released from the hospital? Doing good Any questions or concerns? No  Items Reviewed: Did the pt receive and understand the discharge instructions provided? Yes  Medications obtained and verified? Yes  Other? No  Any new allergies since your discharge? No  Dietary orders reviewed? No Do you have support at home? Yes   Home Care and Equipment/Supplies: Were home health services ordered? not applicable If so, what is the name of the agency? N   Has the agency set up a time to come to the patient's home? not applicable Were any new equipment or medical supplies ordered?  No What is the name of the medical supply agency? N/a Were you able to get the supplies/equipment? not applicable Do you have any questions related to the use of the equipment or supplies? No  Functional Questionnaire: (I = Independent and D = Dependent) ADLs: d  Bathing/Dressing- D  Meal Prep- D  Eating-I  Maintaining continence- I  Transferring/Ambulation- D  Managing Meds- I  Follow up appointments reviewed: PCP Hospital f/u appt confirmed? [address] Einar Pheasant [phone] [facility] f/u appt confirmed?  n   Are transportation arrangements needed? No  If their condition worsens, is the pt aware to call PCP or go to the Emergency Dept.? Yes Was the patient provided with contact information for the PCP's office or ED? Yes Was to pt encouraged to call back with questions or concerns? Yes  SDOH assessments and interventions completed:   Yes  Care Coordination Interventions Activated:  Yes   Care Coordination Interventions:   N     Encounter Outcome:  Pt. Visit Completed   [facility] [phone]

## 2022-08-22 NOTE — Telephone Encounter (Signed)
The last one we got was 07/25/2022 and was faxed back on 07/26/2022

## 2022-08-22 NOTE — Telephone Encounter (Signed)
Noted. Nothing further needed. 

## 2022-08-23 DIAGNOSIS — E782 Mixed hyperlipidemia: Secondary | ICD-10-CM | POA: Diagnosis not present

## 2022-08-23 DIAGNOSIS — I10 Essential (primary) hypertension: Secondary | ICD-10-CM | POA: Diagnosis not present

## 2022-08-23 DIAGNOSIS — R002 Palpitations: Secondary | ICD-10-CM | POA: Diagnosis not present

## 2022-08-23 DIAGNOSIS — I5032 Chronic diastolic (congestive) heart failure: Secondary | ICD-10-CM | POA: Diagnosis not present

## 2022-08-23 DIAGNOSIS — I7789 Other specified disorders of arteries and arterioles: Secondary | ICD-10-CM | POA: Diagnosis not present

## 2022-08-23 DIAGNOSIS — N1832 Chronic kidney disease, stage 3b: Secondary | ICD-10-CM | POA: Diagnosis not present

## 2022-08-23 DIAGNOSIS — R42 Dizziness and giddiness: Secondary | ICD-10-CM | POA: Diagnosis not present

## 2022-08-23 DIAGNOSIS — R55 Syncope and collapse: Secondary | ICD-10-CM | POA: Diagnosis not present

## 2022-08-27 DIAGNOSIS — I509 Heart failure, unspecified: Secondary | ICD-10-CM | POA: Diagnosis not present

## 2022-08-27 DIAGNOSIS — J449 Chronic obstructive pulmonary disease, unspecified: Secondary | ICD-10-CM | POA: Diagnosis not present

## 2022-08-28 ENCOUNTER — Telehealth: Payer: Self-pay | Admitting: Family Medicine

## 2022-08-28 MED ORDER — PROMETHAZINE HCL 12.5 MG PO TABS: 12.5000 mg | ORAL_TABLET | Freq: Two times a day (BID) | ORAL | 0 refills | Status: DC | PRN

## 2022-08-28 NOTE — Telephone Encounter (Signed)
Refill sent in

## 2022-08-28 NOTE — Telephone Encounter (Signed)
  Encourage patient to contact the pharmacy for refills or they can request refills through [facility]  Did the patient contact the pharmacy: No  LAST APPOINTMENT DATE: 07/19/2022  NEXT APPOINTMENT DATE: 09/25/2022  MEDICATION: promethazine (PHENERGAN) 12.5 MG tablet  Is the patient out of medication? Yes  PHARMACY: [id number] [place], [place] - [address] Let patient know to contact pharmacy at the end of the day to make sure medication is ready.  Please notify patient to allow 48-72 hours to process

## 2022-08-31 ENCOUNTER — Ambulatory Visit
Admission: RE | Admit: 2022-08-31 | Discharge: 2022-08-31 | Disposition: A | Discharge status: Home / Self Care | Payer: Medicare HMO | Source: Ambulatory Visit | Attending: Family Medicine | Admitting: Family Medicine

## 2022-08-31 DIAGNOSIS — Z1231 Encounter for screening mammogram for malignant neoplasm of breast: Secondary | ICD-10-CM | POA: Diagnosis not present

## 2022-09-04 DIAGNOSIS — G4752 REM sleep behavior disorder: Secondary | ICD-10-CM | POA: Diagnosis not present

## 2022-09-04 DIAGNOSIS — G43419 Hemiplegic migraine, intractable, without status migrainosus: Secondary | ICD-10-CM | POA: Diagnosis not present

## 2022-09-04 DIAGNOSIS — R55 Syncope and collapse: Secondary | ICD-10-CM | POA: Diagnosis not present

## 2022-09-04 DIAGNOSIS — G20 Parkinson's disease: Secondary | ICD-10-CM | POA: Diagnosis not present

## 2022-09-06 ENCOUNTER — Ambulatory Visit (INDEPENDENT_AMBULATORY_CARE_PROVIDER_SITE_OTHER): Payer: Medicare HMO | Admitting: Family Medicine

## 2022-09-06 ENCOUNTER — Encounter: Payer: Self-pay | Admitting: Family Medicine

## 2022-09-06 ENCOUNTER — Ambulatory Visit
Admission: RE | Admit: 2022-09-06 | Discharge: 2022-09-06 | Disposition: A | Discharge status: Home / Self Care | Payer: Medicare HMO | Source: Ambulatory Visit | Attending: Family Medicine | Admitting: Family Medicine

## 2022-09-06 VITALS — BP 108/62 | HR 90 | Temp 96.7°F | Ht 73.0 in | Wt 183.0 lb

## 2022-09-06 DIAGNOSIS — R197 Diarrhea, unspecified: Secondary | ICD-10-CM | POA: Insufficient documentation

## 2022-09-06 DIAGNOSIS — R1084 Generalized abdominal pain: Secondary | ICD-10-CM | POA: Insufficient documentation

## 2022-09-06 DIAGNOSIS — F331 Major depressive disorder, recurrent, moderate: Secondary | ICD-10-CM | POA: Diagnosis not present

## 2022-09-06 DIAGNOSIS — R112 Nausea with vomiting, unspecified: Secondary | ICD-10-CM | POA: Insufficient documentation

## 2022-09-06 DIAGNOSIS — R69 Illness, unspecified: Secondary | ICD-10-CM | POA: Diagnosis not present

## 2022-09-06 DIAGNOSIS — R0789 Other chest pain: Secondary | ICD-10-CM | POA: Diagnosis not present

## 2022-09-06 DIAGNOSIS — F411 Generalized anxiety disorder: Secondary | ICD-10-CM | POA: Insufficient documentation

## 2022-09-06 DIAGNOSIS — R109 Unspecified abdominal pain: Secondary | ICD-10-CM | POA: Diagnosis not present

## 2022-09-06 LAB — COMPREHENSIVE METABOLIC PANEL
ALT: 1 U/L (ref 0–35)
AST: 5 U/L (ref 0–37)
Albumin: 3.3 g/dL — ABNORMAL LOW (ref 3.5–5.2)
Alkaline Phosphatase: 125 U/L — ABNORMAL HIGH (ref 39–117)
BUN: 18 mg/dL (ref 6–23)
CO2: 23 mEq/L (ref 19–32)
Calcium: 8.9 mg/dL (ref 8.4–10.5)
Chloride: 103 mEq/L (ref 96–112)
Creatinine, Ser: 0.85 mg/dL (ref 0.40–1.20)
GFR: 70.01 mL/min (ref >60.00)
Glucose, Bld: 106 mg/dL — ABNORMAL HIGH (ref 70–99)
Potassium: 3.5 mEq/L (ref 3.5–5.1)
Sodium: 136 mEq/L (ref 135–145)
Total Bilirubin: 0.6 mg/dL (ref 0.2–1.2)
Total Protein: 5.9 g/dL — ABNORMAL LOW (ref 6.0–8.3)

## 2022-09-06 LAB — CBC
HCT: 36.5 % (ref 36.0–46.0)
Hemoglobin: 11.9 g/dL — ABNORMAL LOW (ref 12.0–15.0)
MCHC: 32.5 g/dL (ref 30.0–36.0)
MCV: 82 fl (ref 78.0–100.0)
Platelets: 245 10*3/uL (ref 150.0–400.0)
RBC: 4.45 Mil/uL (ref 3.87–5.11)
RDW: 17.2 % — ABNORMAL HIGH (ref 11.5–15.5)
WBC: 10.4 10*3/uL (ref 4.0–10.5)

## 2022-09-06 MED ORDER — HYDROXYZINE HCL 25 MG PO TABS: 25.0000 mg | ORAL_TABLET | Freq: Two times a day (BID) | ORAL | 0 refills | Status: DC | PRN

## 2022-09-06 MED ORDER — ESCITALOPRAM OXALATE 5 MG PO TABS: 5.0000 mg | ORAL_TABLET | Freq: Every day | ORAL | 1 refills | Status: DC

## 2022-09-06 MED ORDER — IOHEXOL 300 MG/ML  SOLN
100.0000 mL | Freq: Once | INTRAMUSCULAR | Status: AC | PRN
  Administered 2022-09-06: 100 mL via INTRAVENOUS

## 2022-09-06 NOTE — Progress Notes (Signed)
Subjective:     Morgan [name] is a 72 y.o. female presenting for Hypertension (140's over 100's & chest pain x 1 week (stressful home issues)), Emesis (Started saturday), and Diarrhea     Hypertension  Emesis  Associated symptoms include diarrhea.  Diarrhea  Associated symptoms include vomiting.    #HTN/CP - Ulice Bold was taken from the home  - some issues at home and stress - bp has been elevated 140/100 - has had nausea/vomiting - was suggested that she see me - this morning home cuff - 140/113 - neurologist on 9/19 - 128/82 - diarrhea and vomiting - no other sick contacts - no cough or breathing issues  Diarrhea x 4 this morning No recent antibiotics - last was 2 months ago Diarrhea started over the weekend - getting worse  #CP - pressure sensation - when emotionally upset - does get it with activity too - tearful today and with some pressure   Review of Systems  Gastrointestinal:  Positive for diarrhea and vomiting.     Social History   Tobacco Use  Smoking Status Former   Packs/day: 1.00   Years: 15.00   Total pack years: 15.00   Types: Cigarettes   Quit date: 09/12/2017   Years since quitting: 4.9  Smokeless Tobacco Never        Objective:    BP Readings from Last 3 Encounters:  09/06/22 108/62  08/15/22 (!) 146/76  08/09/22 93/62   Wt Readings from Last 3 Encounters:  09/06/22 183 lb (83 kg)  08/15/22 193 lb (87.5 kg)  08/08/22 191 lb 12.8 oz (87 kg)    BP 108/62   Pulse 90   Temp (!) 96.7 F (35.9 C) (Temporal)   Ht '6\' 1"'$  (1.854 m)   Wt 183 lb (83 kg)   SpO2 98%   BMI 24.14 kg/m    Physical Exam Constitutional:      General: She is not in acute distress.    Appearance: She is well-developed. She is not diaphoretic.  HENT:     Right Ear: External ear normal.     Left Ear: External ear normal.     Nose: Nose normal.  Eyes:     Conjunctiva/sclera: Conjunctivae normal.  Cardiovascular:     Rate and Rhythm: Normal rate and  regular rhythm.     Heart sounds: Murmur heard.  Pulmonary:     Effort: Pulmonary effort is normal. No respiratory distress.     Breath sounds: Examination of the left-lower field reveals wheezing. Wheezing present.  Chest:     Chest wall: Tenderness (sternal) present.  Abdominal:     General: Abdomen is flat. Bowel sounds are increased.     Palpations: Abdomen is soft.     Tenderness: There is generalized abdominal tenderness and tenderness in the right lower quadrant and epigastric area. There is guarding. There is no rebound.  Musculoskeletal:     Cervical back: Neck supple.  Skin:    General: Skin is warm and dry.     Capillary Refill: Capillary refill takes less than 2 seconds.  Neurological:     Mental Status: She is alert. Mental status is at baseline.  Psychiatric:        Mood and Affect: Mood normal.        Behavior: Behavior normal.        09/06/2022   10:16 AM 10/17/2021    2:18 PM 08/28/2021    3:52 PM  Depression screen PHQ 2/9  Decreased Interest 2 0 1  Down, Depressed, Hopeless 2 0 1  PHQ - 2 Score 4 0 2  Altered sleeping 2  1  Tired, decreased energy 2  1  Change in appetite 2  1  Feeling bad or failure about yourself  1  1  Trouble concentrating 1  1  Moving slowly or fidgety/restless 1  1  Suicidal thoughts 1  0  PHQ-9 Score 14  8  Difficult doing work/chores Somewhat difficult  Somewhat difficult      09/06/2022   10:16 AM  GAD 7 : Generalized Anxiety Score  Nervous, Anxious, on Edge 1  Control/stop worrying 2  Worry too much - different things 2  Trouble relaxing 2  Restless 1  Easily annoyed or irritable 1  Afraid - awful might happen 1  Total GAD 7 Score 10  Anxiety Difficulty Somewhat difficult          Assessment & Plan:   Problem List Items Addressed This Visit       Digestive   Nausea and vomiting in adult - Primary    Persistent, GI thinks it is due to carbidopa-levodopa.  Is working with neurology to decrease dose.  Try  hydroxyzine for nausea and anxiety.      Relevant Medications   hydrOXYzine (ATARAX) 25 MG tablet   Other Relevant Orders   CT ABDOMEN PELVIS W CONTRAST   Comprehensive metabolic panel   CBC     Other   Major depressive disorder, recurrent ([place])    Returned. Recently lost custody of grandson. Start lexapro 5 mg. Return in 3 weeks.       Relevant Medications   escitalopram (LEXAPRO) 5 MG tablet   hydrOXYzine (ATARAX) 25 MG tablet   Generalized abdominal pain    Long history of recurrent abdominal pain associated with nausea and vomiting.  Today she has lost weight and has vomiting and diarrhea, suspect this may just be a viral GI infection however with painful exam will get CT scan to evaluate for possible diverticulitis.  GI pathogen stool studies also ordered to rule out more serious infections.      Relevant Orders   CT ABDOMEN PELVIS W CONTRAST   Comprehensive metabolic panel   CBC   Generalized anxiety disorder    Worsening stress at home. Start lexapro 5 mg. Return in 3 weeks as schedule to consider dose increase.       Relevant Medications   escitalopram (LEXAPRO) 5 MG tablet   hydrOXYzine (ATARAX) 25 MG tablet   Other Visit Diagnoses     Diarrhea of presumed infectious origin       Relevant Orders   CT ABDOMEN PELVIS W CONTRAST   C. difficile GDH and Toxin A/B   Comprehensive metabolic panel   CBC   Gastrointestinal Pathogen Pnl RT, PCR   Giardia antigen        Return for Keep scheduled appt.  [name], MD

## 2022-09-06 NOTE — Assessment & Plan Note (Signed)
Suspect MSK vs stress related. Treat anxiety/depression. Return if worsening.

## 2022-09-06 NOTE — Assessment & Plan Note (Signed)
Persistent, GI thinks it is due to carbidopa-levodopa.  Is working with neurology to decrease dose.  Try hydroxyzine for nausea and anxiety.

## 2022-09-06 NOTE — Assessment & Plan Note (Signed)
Returned. Recently lost custody of grandson. Start lexapro 5 mg. Return in 3 weeks.

## 2022-09-06 NOTE — Assessment & Plan Note (Signed)
Long history of recurrent abdominal pain associated with nausea and vomiting.  Today she has lost weight and has vomiting and diarrhea, suspect this may just be a viral GI infection however with painful exam will get CT scan to evaluate for possible diverticulitis.  GI pathogen stool studies also ordered to rule out more serious infections.

## 2022-09-06 NOTE — Patient Instructions (Signed)
You are going to start a new antidepressant medication.   One of the risks of this medication is increase in suicidal thoughts.   Your suicide Action plan is as follows:  1) Call your Aunt 2) Call the Suicide Hotline [phone] which is available 24 hours 3) Call the Clinic    The most common side effect is stomach upset. If this happens it means the medication is working. It should get better in 1-3 weeks.   Medication for depression and anxiety often takes 6-8 weeks to have a noticeable difference so stick with it. Also the best way for recovery is taking medication and seeing a therapist -- this is so important.    How to help anxiety and depression  1) Regular Exercise - walking, jogging, cycling, dancing, strength training - aiming for 150 minutes of exercise a week --> Yoga has been shown in research to reduce depression and anxiety -- with even just one hour long session per week  2)  Begin a Mindfulness/Meditation practice -- this can take a little as 3 minutes and is helpful for all kinds of mood issues -- You can find resources in books -- Or you can download apps like  ---- Headspace App  ---- Calm  ---- Insignt Timer ---- Stop, Breathe & Think  # With each of these Apps - you should decline the "start free trial" offer and as you search through the App should be able to access some of their free content. You can also chose to pay for the content if you find one that works well for you.   # Many of them also offer sleep specific content which may help with insomnia  3) Healthy Diet -- Avoid or decrease Caffeine -- Avoid or decrease Alcohol -- Drink plenty of water, have a balanced diet -- Avoid cigarettes and marijuana (as well as other recreational drugs)  4) Find a therapist  -- [facility] is one option. Call [phone] -- Or you can check out www.psychologytoday.com -- you can read bios of therapists and see if they accept insurance --  Check with your insurance to see if you have coverage and who may take your insurance

## 2022-09-06 NOTE — Assessment & Plan Note (Signed)
Worsening stress at home. Start lexapro 5 mg. Return in 3 weeks as schedule to consider dose increase.

## 2022-09-07 DIAGNOSIS — R197 Diarrhea, unspecified: Secondary | ICD-10-CM | POA: Diagnosis not present

## 2022-09-09 ENCOUNTER — Other Ambulatory Visit: Payer: Self-pay

## 2022-09-09 ENCOUNTER — Inpatient Hospital Stay
Admission: EM | Admit: 2022-09-09 | Discharge: 2022-09-14 | DRG: 392 | Disposition: A | Discharge status: Home / Self Care | Payer: Medicare HMO | Attending: Internal Medicine | Admitting: Internal Medicine

## 2022-09-09 ENCOUNTER — Emergency Department: Payer: Medicare HMO

## 2022-09-09 ENCOUNTER — Encounter: Payer: Self-pay | Admitting: Emergency Medicine

## 2022-09-09 DIAGNOSIS — D509 Iron deficiency anemia, unspecified: Secondary | ICD-10-CM | POA: Diagnosis present

## 2022-09-09 DIAGNOSIS — M1990 Unspecified osteoarthritis, unspecified site: Secondary | ICD-10-CM | POA: Diagnosis present

## 2022-09-09 DIAGNOSIS — Z90710 Acquired absence of both cervix and uterus: Secondary | ICD-10-CM

## 2022-09-09 DIAGNOSIS — Z9049 Acquired absence of other specified parts of digestive tract: Secondary | ICD-10-CM

## 2022-09-09 DIAGNOSIS — R1032 Left lower quadrant pain: Secondary | ICD-10-CM | POA: Diagnosis not present

## 2022-09-09 DIAGNOSIS — Z9981 Dependence on supplemental oxygen: Secondary | ICD-10-CM

## 2022-09-09 DIAGNOSIS — G20C Parkinsonism, unspecified: Secondary | ICD-10-CM | POA: Diagnosis present

## 2022-09-09 DIAGNOSIS — Z8249 Family history of ischemic heart disease and other diseases of the circulatory system: Secondary | ICD-10-CM

## 2022-09-09 DIAGNOSIS — Z800 Family history of malignant neoplasm of digestive organs: Secondary | ICD-10-CM

## 2022-09-09 DIAGNOSIS — Z83438 Family history of other disorder of lipoprotein metabolism and other lipidemia: Secondary | ICD-10-CM

## 2022-09-09 DIAGNOSIS — I700 Atherosclerosis of aorta: Secondary | ICD-10-CM | POA: Diagnosis not present

## 2022-09-09 DIAGNOSIS — G43909 Migraine, unspecified, not intractable, without status migrainosus: Secondary | ICD-10-CM | POA: Diagnosis present

## 2022-09-09 DIAGNOSIS — Z79899 Other long term (current) drug therapy: Secondary | ICD-10-CM

## 2022-09-09 DIAGNOSIS — R109 Unspecified abdominal pain: Secondary | ICD-10-CM | POA: Diagnosis not present

## 2022-09-09 DIAGNOSIS — Z91018 Allergy to other foods: Secondary | ICD-10-CM

## 2022-09-09 DIAGNOSIS — I10 Essential (primary) hypertension: Secondary | ICD-10-CM | POA: Diagnosis not present

## 2022-09-09 DIAGNOSIS — D631 Anemia in chronic kidney disease: Secondary | ICD-10-CM | POA: Diagnosis present

## 2022-09-09 DIAGNOSIS — F411 Generalized anxiety disorder: Secondary | ICD-10-CM | POA: Diagnosis present

## 2022-09-09 DIAGNOSIS — R634 Abnormal weight loss: Secondary | ICD-10-CM | POA: Diagnosis present

## 2022-09-09 DIAGNOSIS — K529 Noninfective gastroenteritis and colitis, unspecified: Secondary | ICD-10-CM | POA: Diagnosis not present

## 2022-09-09 DIAGNOSIS — Z9851 Tubal ligation status: Secondary | ICD-10-CM

## 2022-09-09 DIAGNOSIS — E876 Hypokalemia: Secondary | ICD-10-CM | POA: Diagnosis present

## 2022-09-09 DIAGNOSIS — G20 Parkinson's disease: Secondary | ICD-10-CM | POA: Diagnosis present

## 2022-09-09 DIAGNOSIS — J449 Chronic obstructive pulmonary disease, unspecified: Secondary | ICD-10-CM | POA: Diagnosis present

## 2022-09-09 DIAGNOSIS — G20A1 Parkinson's disease without dyskinesia, without mention of fluctuations: Secondary | ICD-10-CM | POA: Diagnosis present

## 2022-09-09 DIAGNOSIS — K5730 Diverticulosis of large intestine without perforation or abscess without bleeding: Secondary | ICD-10-CM | POA: Diagnosis present

## 2022-09-09 DIAGNOSIS — F339 Major depressive disorder, recurrent, unspecified: Secondary | ICD-10-CM | POA: Diagnosis present

## 2022-09-09 DIAGNOSIS — Z87891 Personal history of nicotine dependence: Secondary | ICD-10-CM

## 2022-09-09 DIAGNOSIS — J9611 Chronic respiratory failure with hypoxia: Secondary | ICD-10-CM | POA: Diagnosis present

## 2022-09-09 DIAGNOSIS — Z885 Allergy status to narcotic agent status: Secondary | ICD-10-CM

## 2022-09-09 DIAGNOSIS — F419 Anxiety disorder, unspecified: Secondary | ICD-10-CM | POA: Diagnosis present

## 2022-09-09 DIAGNOSIS — R112 Nausea with vomiting, unspecified: Secondary | ICD-10-CM | POA: Diagnosis present

## 2022-09-09 DIAGNOSIS — I5032 Chronic diastolic (congestive) heart failure: Secondary | ICD-10-CM | POA: Diagnosis present

## 2022-09-09 DIAGNOSIS — E785 Hyperlipidemia, unspecified: Secondary | ICD-10-CM | POA: Diagnosis present

## 2022-09-09 DIAGNOSIS — Z6824 Body mass index (BMI) 24.0-24.9, adult: Secondary | ICD-10-CM

## 2022-09-09 DIAGNOSIS — G43109 Migraine with aura, not intractable, without status migrainosus: Secondary | ICD-10-CM | POA: Diagnosis present

## 2022-09-09 DIAGNOSIS — K219 Gastro-esophageal reflux disease without esophagitis: Secondary | ICD-10-CM | POA: Diagnosis present

## 2022-09-09 DIAGNOSIS — Z888 Allergy status to other drugs, medicaments and biological substances status: Secondary | ICD-10-CM

## 2022-09-09 DIAGNOSIS — Z91030 Bee allergy status: Secondary | ICD-10-CM

## 2022-09-09 DIAGNOSIS — Z20822 Contact with and (suspected) exposure to covid-19: Secondary | ICD-10-CM | POA: Diagnosis present

## 2022-09-09 DIAGNOSIS — Z8719 Personal history of other diseases of the digestive system: Secondary | ICD-10-CM

## 2022-09-09 LAB — URINALYSIS, MICROSCOPIC (REFLEX)

## 2022-09-09 LAB — LIPASE, BLOOD: Lipase: 23 U/L (ref 11–51)

## 2022-09-09 LAB — RESP PANEL BY RT-PCR (FLU A&B, COVID) ARPGX2
Influenza A by PCR: NEGATIVE
Influenza B by PCR: NEGATIVE
SARS Coronavirus 2 by RT PCR: NEGATIVE

## 2022-09-09 LAB — COMPREHENSIVE METABOLIC PANEL
ALT: 6 U/L (ref 0–44)
AST: 14 U/L — ABNORMAL LOW (ref 15–41)
Albumin: 3.2 g/dL — ABNORMAL LOW (ref 3.5–5.0)
Alkaline Phosphatase: 127 U/L — ABNORMAL HIGH (ref 38–126)
Anion gap: 9 (ref 5–15)
BUN: 13 mg/dL (ref 8–23)
CO2: 21 mmol/L — ABNORMAL LOW (ref 22–32)
Calcium: 8.8 mg/dL — ABNORMAL LOW (ref 8.9–10.3)
Chloride: 109 mmol/L (ref 98–111)
Creatinine, Ser: 0.72 mg/dL (ref 0.44–1.00)
GFR, Estimated: >60 mL/min (ref >60)
Glucose, Bld: 109 mg/dL — ABNORMAL HIGH (ref 70–99)
Potassium: 3.1 mmol/L — ABNORMAL LOW (ref 3.5–5.1)
Sodium: 139 mmol/L (ref 135–145)
Total Bilirubin: 0.8 mg/dL (ref 0.3–1.2)
Total Protein: 6.2 g/dL — ABNORMAL LOW (ref 6.5–8.1)

## 2022-09-09 LAB — CBC
HCT: 36.5 % (ref 36.0–46.0)
Hemoglobin: 12 g/dL (ref 12.0–15.0)
MCH: 26.5 pg (ref 26.0–34.0)
MCHC: 32.9 g/dL (ref 30.0–36.0)
MCV: 80.8 fL (ref 80.0–100.0)
Platelets: 297 10*3/uL (ref 150–400)
RBC: 4.52 MIL/uL (ref 3.87–5.11)
RDW: 16.8 % — ABNORMAL HIGH (ref 11.5–15.5)
WBC: 7.9 10*3/uL (ref 4.0–10.5)
nRBC: 0 % (ref 0.0–0.2)

## 2022-09-09 LAB — URINALYSIS, ROUTINE W REFLEX MICROSCOPIC
Bilirubin Urine: NEGATIVE
Glucose, UA: NEGATIVE mg/dL
Ketones, ur: NEGATIVE mg/dL
Nitrite: NEGATIVE
Protein, ur: NEGATIVE mg/dL
Specific Gravity, Urine: 1.01 (ref 1.005–1.030)
pH: 7 (ref 5.0–8.0)

## 2022-09-09 LAB — MAGNESIUM: Magnesium: 1.8 mg/dL (ref 1.7–2.4)

## 2022-09-09 LAB — LACTIC ACID, PLASMA: Lactic Acid, Venous: 1.8 mmol/L (ref 0.5–1.9)

## 2022-09-09 MED ORDER — ACETAMINOPHEN 650 MG RE SUPP: 650.0000 mg | Freq: Four times a day (QID) | RECTAL | Status: DC | PRN

## 2022-09-09 MED ORDER — SODIUM CHLORIDE 0.9 % IV BOLUS
500.0000 mL | Freq: Once | INTRAVENOUS | Status: AC
  Administered 2022-09-09: 500 mL via INTRAVENOUS

## 2022-09-09 MED ORDER — SENNOSIDES-DOCUSATE SODIUM 8.6-50 MG PO TABS: 1.0000 | ORAL_TABLET | Freq: Every evening | ORAL | Status: DC | PRN

## 2022-09-09 MED ORDER — SODIUM CHLORIDE 0.9 % IV SOLN
12.5000 mg | Freq: Four times a day (QID) | INTRAVENOUS | Status: AC | PRN
  Administered 2022-09-09 – 2022-09-10 (×2): 12.5 mg via INTRAVENOUS
  Filled 2022-09-09 (×2): qty 12.5

## 2022-09-09 MED ORDER — FENTANYL CITRATE PF 50 MCG/ML IJ SOSY
50.0000 ug | PREFILLED_SYRINGE | Freq: Once | INTRAMUSCULAR | Status: AC
  Administered 2022-09-09: 50 ug via INTRAVENOUS
  Filled 2022-09-09: qty 1

## 2022-09-09 MED ORDER — IOHEXOL 300 MG/ML  SOLN
100.0000 mL | Freq: Once | INTRAMUSCULAR | Status: AC | PRN
  Administered 2022-09-09: 100 mL via INTRAVENOUS

## 2022-09-09 MED ORDER — ONDANSETRON HCL 4 MG/2ML IJ SOLN
4.0000 mg | Freq: Four times a day (QID) | INTRAMUSCULAR | Status: DC | PRN
  Administered 2022-09-09 – 2022-09-13 (×5): 4 mg via INTRAVENOUS
  Filled 2022-09-09 (×5): qty 2

## 2022-09-09 MED ORDER — POTASSIUM CHLORIDE 10 MEQ/100ML IV SOLN
10.0000 meq | INTRAVENOUS | Status: AC
  Administered 2022-09-09 (×2): 10 meq via INTRAVENOUS
  Filled 2022-09-09 (×2): qty 100

## 2022-09-09 MED ORDER — FENTANYL CITRATE PF 50 MCG/ML IJ SOSY: 25.0000 ug | PREFILLED_SYRINGE | INTRAMUSCULAR | Status: DC | PRN

## 2022-09-09 MED ORDER — MORPHINE SULFATE (PF) 2 MG/ML IV SOLN
2.0000 mg | INTRAVENOUS | Status: AC | PRN
  Administered 2022-09-09 – 2022-09-10 (×4): 2 mg via INTRAVENOUS
  Filled 2022-09-09 (×4): qty 1

## 2022-09-09 MED ORDER — MORPHINE SULFATE (PF) 4 MG/ML IV SOLN
4.0000 mg | Freq: Once | INTRAVENOUS | Status: AC
  Administered 2022-09-09: 4 mg via INTRAVENOUS
  Filled 2022-09-09: qty 1

## 2022-09-09 MED ORDER — SODIUM CHLORIDE 0.9 % IV SOLN: INTRAVENOUS | Status: DC

## 2022-09-09 MED ORDER — CARBIDOPA-LEVODOPA 25-100 MG PO TABS
2.0000 | ORAL_TABLET | Freq: Three times a day (TID) | ORAL | Status: DC
  Administered 2022-09-09 – 2022-09-14 (×14): 2 via ORAL
  Filled 2022-09-09 (×15): qty 2

## 2022-09-09 MED ORDER — ENOXAPARIN SODIUM 40 MG/0.4ML IJ SOSY
40.0000 mg | PREFILLED_SYRINGE | INTRAMUSCULAR | Status: DC
  Administered 2022-09-09 – 2022-09-13 (×5): 40 mg via SUBCUTANEOUS
  Filled 2022-09-09 (×5): qty 0.4

## 2022-09-09 MED ORDER — ESCITALOPRAM OXALATE 10 MG PO TABS
5.0000 mg | ORAL_TABLET | Freq: Every day | ORAL | Status: DC
  Administered 2022-09-10 – 2022-09-14 (×5): 5 mg via ORAL
  Filled 2022-09-09 (×5): qty 1

## 2022-09-09 MED ORDER — LORAZEPAM 2 MG/ML IJ SOLN
0.5000 mg | Freq: Four times a day (QID) | INTRAMUSCULAR | Status: DC | PRN
  Administered 2022-09-11 – 2022-09-12 (×2): 0.5 mg via INTRAVENOUS
  Filled 2022-09-09 (×2): qty 1

## 2022-09-09 MED ORDER — ONDANSETRON HCL 4 MG/2ML IJ SOLN
4.0000 mg | Freq: Once | INTRAMUSCULAR | Status: AC
  Administered 2022-09-09: 4 mg via INTRAVENOUS
  Filled 2022-09-09: qty 2

## 2022-09-09 MED ORDER — ONDANSETRON HCL 4 MG PO TABS: 4.0000 mg | ORAL_TABLET | Freq: Four times a day (QID) | ORAL | Status: DC | PRN

## 2022-09-09 MED ORDER — LORAZEPAM 2 MG/ML IJ SOLN: 0.5000 mg | Freq: Four times a day (QID) | INTRAMUSCULAR | Status: DC | PRN

## 2022-09-09 MED ORDER — ACETAMINOPHEN 325 MG PO TABS
650.0000 mg | ORAL_TABLET | Freq: Four times a day (QID) | ORAL | Status: DC | PRN
  Administered 2022-09-12 – 2022-09-13 (×2): 650 mg via ORAL
  Filled 2022-09-09 (×2): qty 2

## 2022-09-09 MED ORDER — TOPIRAMATE 25 MG PO TABS
25.0000 mg | ORAL_TABLET | Freq: Every day | ORAL | Status: DC
  Administered 2022-09-09 – 2022-09-13 (×5): 25 mg via ORAL
  Filled 2022-09-09 (×5): qty 1

## 2022-09-09 NOTE — ED Provider Notes (Signed)
[facility] Provider Note    Event Date/Time   First MD Initiated Contact with Patient 09/09/22 [phone]     (approximate)   History   Abdominal Pain, Nausea, Emesis, and Diarrhea   HPI  Morgan [name] is a 71 y.o. female with history of Parkinson's, complex migraines, COPD, anemia, and GERD who presents with abdominal pain for the last week, mainly on the left side, initially associated with diarrhea although the patient is now taking Imodium.  She has no blood in the stool.  She reports nausea and dry heaving.  She has not been able to eat or drink.  She also states she is having difficulty urinating but no dysuria.  She was seen by her PMD and had a CT several days ago which was negative.  She has had diverticulitis previously and states that the current symptoms are similar.  I reviewed the past medical records.  The patient most recently admitted a month ago; per the hospitalist discharge summary from 8/24 she presented with complex migraine and was ruled out for stroke.  She had a CT abdomen on 9/21 for the current symptoms which is negative for acute findings.   Physical Exam   Triage Vital Signs: ED Triage Vitals  Enc Vitals Group     BP 09/09/22 1015 (!) 142/98     Pulse Rate 09/09/22 1015 (!) 103     Resp 09/09/22 1015 20     Temp 09/09/22 1015 98.4 F (36.9 C)     Temp Source 09/09/22 1015 Oral     SpO2 09/09/22 1015 94 %     Weight 09/09/22 0945 182 lb 15.7 oz (83 kg)     Height 09/09/22 0945 '6\' 1"'$  (1.854 m)     Head Circumference --      Peak Flow --      Pain Score 09/09/22 0945 8     Pain Loc --      Pain Edu? --      Excl. in [place]? --     Most recent vital signs: Vitals:   09/09/22 1015 09/09/22 1019  BP: (!) 142/98 (!) 151/93  Pulse: (!) 103 82  Resp: 20 20  Temp: 98.4 F (36.9 C)   SpO2: 94% 100%     General: Alert, tearful, uncomfortable appearing. CV:  Good peripheral perfusion.  Resp:  Normal effort.  Abd:  Bilateral  lower abdominal tenderness worse on the left.  No peritoneal signs.  No distention.  Other:  No scleral icterus.  Dry mucous membranes.   ED Results / Procedures / Treatments   Labs (all labs ordered are listed, but only abnormal results are displayed) Labs Reviewed  COMPREHENSIVE METABOLIC PANEL - Abnormal; Notable for the following components:      Result Value   Potassium 3.1 (*)    CO2 21 (*)    Glucose, Bld 109 (*)    Calcium 8.8 (*)    Total Protein 6.2 (*)    Albumin 3.2 (*)    AST 14 (*)    Alkaline Phosphatase 127 (*)    All other components within normal limits  CBC - Abnormal; Notable for the following components:   RDW 16.8 (*)    All other components within normal limits  LIPASE, BLOOD  URINALYSIS, ROUTINE W REFLEX MICROSCOPIC  LACTIC ACID, PLASMA  LACTIC ACID, PLASMA     EKG  ED ECG REPORT I, Arta Silence, the attending physician, personally viewed and interpreted this  ECG.  Date: 09/09/2022 EKG Time: 1432 Rate: 79 Rhythm: normal sinus rhythm QRS Axis: normal Intervals: normal ST/T Wave abnormalities: Borderline repolarization abnormality Narrative Interpretation: no evidence of acute ischemia    RADIOLOGY  CT abdomen/pelvis: I independently viewed and interpreted the images; there are no dilated bowel loops, free fluid, or free air.  Radiology report indicates no acute abnormalities.  PROCEDURES:  Critical Care performed: No  Procedures   MEDICATIONS ORDERED IN ED: Medications  ondansetron (ZOFRAN) injection 4 mg (has no administration in time range)  morphine (PF) 4 MG/ML injection 4 mg (has no administration in time range)  sodium chloride 0.9 % bolus 500 mL (has no administration in time range)     IMPRESSION / MDM / ASSESSMENT AND PLAN / ED COURSE  I reviewed the triage vital signs and the nursing notes.  71 year old female with PMH as noted above presents with continued lower abdominal pain for the last week similar to  prior episodes of diverticulitis.  She was seen by her PMD and had a CT done several days ago which was negative.  On exam she has significant tenderness in the left lower abdomen.  Differential diagnosis includes, but is not limited to, diverticulitis, colitis, gastroenteritis, SBO, volvulus, IBS or other functional etiology.  Patient's presentation is most consistent with acute presentation with potential threat to life or bodily function.  Given the patient's age and her tenderness on exam we will obtain a repeat CT today to rule out acute life-threatening cause of the worsened symptoms.  ----------------------------------------- 2:57 PM on 09/09/2022 -----------------------------------------  ED work-up is overall negative.  CT today continues to show no acute abnormalities.  There is no leukocytosis, urinalysis is negative except for small quantity of white and red cells.  Is overall not consistent with UTI or pyelonephritis significant enough to cause the patient's amount of pain.  Lactate is normal and there is no evidence of ischemic colitis.  Electrolytes are unremarkable.  Patient continues to have significant pain and nausea.  Therefore we discussed admission for pain control, hydration, and potential further work-up.  I consulted Dr. Tobie Poet from the hospitalist service; based on her discussion she agrees to admit the patient.   FINAL CLINICAL IMPRESSION(S) / ED DIAGNOSES   Final diagnoses:  None     Rx / DC Orders   ED Discharge Orders     None        Note:  This document was prepared using Dragon voice recognition software and may include unintentional dictation errors.    Arta Silence, MD 09/09/22 [phone]

## 2022-09-09 NOTE — Assessment & Plan Note (Signed)
-   Per patient approximately 100 pounds - Her PCP notes this and she is currently being worked up

## 2022-09-09 NOTE — Assessment & Plan Note (Signed)
-   Resumed home carbidopa-levodopa 25-100 mg tablet, 2 tablets, p.o., 3 times daily

## 2022-09-09 NOTE — H&P (Signed)
History and Physical   Morgan [name] YBO:[id number] DOB: [birth year] DOA: 09/09/2022  PCP: [name], MD  Outpatient Specialists: Dr. Gregg[facility] Patient coming from: Home  I have personally briefly reviewed patient's old medical records in [place].  Chief Concern: Intractable nausea and vomiting  HPI: Morgan [name] is a 71 year old female with Parkinson's disease, depression, anxiety, GERD, complex migraines with aura, chronic anemia, who presents emergency department for chief concerns of abdominal pain, diarrhea and intractable nausea vomiting.  Initial vitals in the emergency department showed temperature 98.4, respiration rate of 20, heart rate of 71, blood pressure 163/91, SPO2 of 100% on room air.  Serum sodium is 139, potassium 3.1, chloride of 109, bicarb 21, BUN of 13, serum creatinine of 0.72, nonfasting blood glucose 109, WBC 7.9, hemoglobin 12, platelets of 297.  eGFR greater than 60.  Lactic acid was 1.8.  UA was positive for small leukocytes.  CT abdomen pelvis with contrast in the ED was read as no acute or inflammatory process identified in the abdomen or pelvis.  Chronic sigmoid diverticulosis, no active inflammation.  Aortic atherosclerosis.  ED treatment: Fentanyl 50 mcg IV, morphine 4 mg IV, ondansetron 4 mg IV, sodium chloride 500 mL bolus. --------------------------------------- At bedside patient is awake alert and oriented x3.  She reports intractable nausea and vomiting and diarrhea that started on Thursday, 09/06/2022.  She reports taking outpatient medication however ran out of it and the symptoms have not improved prompting her to present to the ED.  She denies blood in her stool.  She reports her last bowel movement/diarrhea on 09/08/2022.  She denies any fever, chills, chest pain, shortness of breath, vision changes, dysuria, hematuria.  She denies swelling of her lower extremities and syncope.  She reports no changes to her  diet and no known sick contacts.  She reports she has lost approximately unintentional 100 pounds in about 1 year.  She states that her PCP knows and she is currently being worked up for this.  She reports she just had a colonoscopy and was told it was negative.  Social history: She lives at home sharing a room in a couples home.  She denies tobacco, EtOH, recreational drug use.  She is retired and formerly was a [employer]ar[employer] manager.  ROS: Constitutional: + weight change, no fever ENT/Mouth: no sore throat, no rhinorrhea Eyes: no eye pain, no vision changes Cardiovascular: no chest pain, no dyspnea,  no edema, no palpitations Respiratory: no cough, no sputum, no wheezing Gastrointestinal: + nausea, + vomiting, + diarrhea, no constipation Genitourinary: no urinary incontinence, no dysuria, no hematuria Musculoskeletal: no arthralgias, no myalgias Skin: no skin lesions, no pruritus, Neuro: + weakness, no loss of consciousness, no syncope Psych: no anxiety, no depression, + decrease appetite Heme/Lymph: no bruising, no bleeding  ED Course: Discussed with emergency medicine provider, patient requiring hospitalization for chief concerns of intractable nausea and vomiting.  Assessment/Plan  Principal Problem:   Intractable nausea and vomiting Active Problems:   Hypokalemia   Parkinson disease (HCC)   GERD (gastroesophageal reflux disease)   Major depressive disorder, recurrent (HCC)   Anxiety   HLD (hyperlipidemia)   Chronic respiratory failure with hypoxia (HCC)   IDA (iron deficiency anemia)   Migraine headache   Generalized anxiety disorder   Unintentional weight loss   Assessment and Plan:  * Intractable nausea and vomiting - Etiology work-up in progress - Check COVID/influenza A/influenza B PCR - Query gastroenteritis versus IBS  Hypokalemia -  Check serum magnesium on admission - Potassium chloride 10 mill equivalent IV over 60 minutes, 4 doses ordered - Recheck potassium with BMP in  the a.m.  Parkinson disease ([place]) - Resumed home carbidopa-levodopa 25-100 mg tablet, 2 tablets, p.o., 3 times daily  Unintentional weight loss - Per patient approximately 100 pounds - Her PCP notes this and she is currently being worked up  Generalized anxiety disorder - Escitalopram 5 mg daily resumed - Ativan 0.5 mg IV every 6 hours as needed for breakthrough anxiety and panic attacks, 4 doses ordered  Migraine headache - Resumed home topiramate 25 mg nightly  IDA (iron deficiency anemia) - Appears to be at better than baseline  Chart reviewed.   DVT prophylaxis: Enoxaparin 40 mg subcutaneous, nightly Code Status: Full code Diet: Clear liquids; with nursing order: To advance diet from clear liquids to full liquids Family Communication: A phone call was offered, patient declined stating that she will call her love ones to update them Disposition Plan: Pending clinical course Consults called: None at this time Admission status: MedSurg, observation  Past Medical History:  Diagnosis Date   Allergy    Anemia    Anxiety    Arthritis    CHF (congestive heart failure) (HCC)    COPD (chronic obstructive pulmonary disease) ([place])    Depression    GERD (gastroesophageal reflux disease)    Hypertension    MVP (mitral valve prolapse)    Parkinson disease ([place])    Past Surgical History:  Procedure Laterality Date   ABDOMINAL HYSTERECTOMY     APPENDECTOMY     BALLOON DILATION N/A 04/21/2020   Procedure: BALLOON DILATION;  Surgeon: Milus Banister, MD;  Location: [facility];  Service: Endoscopy;  Laterality: N/A;  pyloric/ GI   BALLOON DILATION N/A 07/04/2020   Procedure: BALLOON DILATION;  Surgeon: Mauri Pole, MD;  Location: [facility];  Service: Endoscopy;  Laterality: N/A;   BIOPSY  04/21/2020   Procedure: BIOPSY;  Surgeon: Milus Banister, MD;  Location: [facility];  Service: Endoscopy;;   BIOPSY  07/04/2020   Procedure: BIOPSY;  Surgeon: Mauri Pole,  MD;  Location: [facility];  Service: Endoscopy;;  EGD and COLON   BREAST SURGERY Right 1988   tumor removal   CHOLECYSTECTOMY N/A 05/18/2020   Procedure: LAPAROSCOPIC CHOLECYSTECTOMY;  Surgeon: Johnathan Hausen, MD;  Location: [facility];  Service: General;  Laterality: N/A;   COLONOSCOPY WITH PROPOFOL N/A 07/04/2020   Procedure: COLONOSCOPY WITH PROPOFOL;  Surgeon: Mauri Pole, MD;  Location: [facility];  Service: Endoscopy;  Laterality: N/A;   COLONOSCOPY WITH PROPOFOL N/A 11/03/2021   Procedure: COLONOSCOPY WITH PROPOFOL;  Surgeon: Lesly Rubenstein, MD;  Location: [facility];  Service: Endoscopy;  Laterality: N/A;   ESOPHAGEAL DILATION  08/11/2020   Procedure: PYLORIC DILATION;  Surgeon: Doran Stabler, MD;  Location: [facility];  Service: Gastroenterology;;   ESOPHAGOGASTRODUODENOSCOPY (EGD) WITH PROPOFOL N/A 04/21/2020   Procedure: ESOPHAGOGASTRODUODENOSCOPY (EGD) WITH PROPOFOL;  Surgeon: Milus Banister, MD;  [facility];  Service: Endoscopy;  Laterality: N/A;   ESOPHAGOGASTRODUODENOSCOPY (EGD) WITH PROPOFOL N/A 07/04/2020   Procedure: ESOPHAGOGASTRODUODENOSCOPY (EGD) WITH PROPOFOL;  Surgeon: Mauri Pole, MD;  [facility];  Service: Endoscopy;  Laterality: N/A;   ESOPHAGOGASTRODUODENOSCOPY (EGD) WITH PROPOFOL N/A 08/11/2020   Procedure: ESOPHAGOGASTRODUODENOSCOPY (EGD) WITH PROPOFOL;  Surgeon: Doran Stabler, MD;  [facility]: [facility];  Service: Gastroenterology;  Laterality: N/A;   ESOPHAGOGASTRODUODENOSCOPY (EGD) WITH PROPOFOL N/A 11/03/2021  Procedure: ESOPHAGOGASTRODUODENOSCOPY (EGD) WITH PROPOFOL;  Surgeon: Lesly Rubenstein, MD;  Location: [facility];  Service: Endoscopy;  Laterality: N/A;   ESOPHAGOGASTRODUODENOSCOPY (EGD) WITH PROPOFOL N/A 05/07/2022   Procedure: ESOPHAGOGASTRODUODENOSCOPY (EGD) WITH PROPOFOL;  Surgeon: Lin Landsman, MD;  [facility];  Service: Gastroenterology;  Laterality: N/A;   KNEE  SURGERY  2005   2 times left   KNEE SURGERY Right    KYPHOPLASTY N/A 07/20/2021   Procedure: T 10KYPHOPLASTY;  Surgeon: Hessie Knows, MD;  Location: [facility];  Service: Orthopedics;  Laterality: N/A;   KYPHOPLASTY N/A 08/23/2021   Procedure: T9 KYPHOPLASTY;  Surgeon: Hessie Knows, MD;  Location: [facility];  Service: Orthopedics;  Laterality: N/A;   POLYPECTOMY  07/04/2020   Procedure: POLYPECTOMY;  Surgeon: Mauri Pole, MD;  [facility]: [facility];  Service: Endoscopy;;   TUBAL LIGATION     Social History:  reports that she quit smoking about 4 years ago. Her smoking use included cigarettes. She has a 15.00 pack-year smoking history. She has never used smokeless tobacco. She reports that she does not currently use alcohol. She reports that she does not use drugs.  Allergies  Allergen Reactions   Meperidine Hives and Anaphylaxis   Strawberry Extract Hives and Swelling   Sucralfate Nausea Only and Other (See Comments)    Severe nausea Other reaction(s): Other (See Comments) Severe nausea Severe nausea   Wasp Venom Hives   Family History  Problem Relation Age of Onset   Breast cancer Mother 74   Ovarian cancer Mother 49   Brain cancer Mother    Hypertension Father    Hyperlipidemia Father    Heart disease Father    Colon cancer Father        diagnosed in his 83s   Breast cancer Maternal Grandmother 15   Diabetes Paternal Grandmother    Breast cancer Paternal Grandmother 39   Other Son        drug over dose   Colon cancer Maternal Grandfather        dx in his 53s   Liver disease Maternal Grandfather    Breast cancer Maternal Aunt 60   Breast cancer Maternal Aunt 60   Colon cancer Maternal Uncle    Stomach cancer Neg Hx    Pancreatic cancer Neg Hx    Esophageal cancer Neg Hx    Family history: Family history reviewed and not pertinent.  Prior to Admission medications   Medication Sig Start Date End Date Taking? Authorizing Provider  acetaminophen (TYLENOL) 325 MG  tablet Take 2 tablets (650 mg total) by mouth every 4 (four) hours as needed for mild pain (or temp > 37.5 C (99.5 F)). 01/30/22   Sharen Hones, MD  carbidopa-levodopa (SINEMET IR) 25-100 MG tablet Take 2 tablets by mouth 3 (three) times daily. 11/21/21   [provider]  escitalopram (LEXAPRO) 5 MG tablet Take 1 tablet (5 mg total) by mouth daily. 09/06/22   [name], MD  hydrOXYzine (ATARAX) 25 MG tablet Take 1 tablet (25 mg total) by mouth 2 (two) times daily as needed for anxiety or nausea. 09/06/22   [name], MD  ibuprofen (ADVIL) 400 MG tablet Take 1 tablet (400 mg total) by mouth every 8 (eight) hours as needed for headache or moderate pain. 08/09/22   Fritzi Mandes, MD  OXYGEN Inhale 3 L/min into the lungs See admin instructions. 3 L/min at bedtime and as needed for shortness of breath throughout the day  [provider]  promethazine (PHENERGAN) 12.5 MG tablet Take 1 tablet (12.5 mg total) by mouth every 12 (twelve) hours as needed for nausea or vomiting. 08/28/22   [name], MD  topiramate (TOPAMAX) 25 MG tablet Take 1 tablet by mouth at bedtime. 08/10/22   [provider]   Physical Exam: Vitals:   09/09/22 1200 09/09/22 1230 09/09/22 1300 09/09/22 1432  BP: (!) 178/91 (!) 178/91 (!) 148/87 (!) 154/91  Pulse: 66 71 74 77  Resp:   18 16  Temp:    98.6 F (37 C)  TempSrc:    Oral  SpO2: 100% 100% 100% 100%  Weight:      Height:       Constitutional: appears age-appropriate, frail, NAD, calm, comfortable Eyes: PERRL, lids and conjunctivae normal ENMT: Mucous membranes are moist. Posterior pharynx clear of any exudate or lesions. Age-appropriate dentition. Hearing appropriate Neck: normal, supple, no masses, no thyromegaly Respiratory: clear to auscultation bilaterally, no wheezing, no crackles. Normal respiratory effort. No accessory muscle use.  Cardiovascular: Regular rate and rhythm, no murmurs / rubs / gallops. No extremity edema. 2+  pedal pulses. No carotid bruits.  Abdomen: no tenderness, no masses palpated, no hepatosplenomegaly. Bowel sounds positive.  Musculoskeletal: no clubbing / cyanosis. No joint deformity upper and lower extremities. Good ROM, no contractures, no atrophy. Normal muscle tone.  Skin: no rashes, lesions, ulcers. No induration.  Type II in her right upper extremity and left lower extremity, appears to be old and well-healed. Neurologic: Sensation intact. Strength 5/5 in all 4.  Mild right-sided lip drooping and right eye droops more than the left side.  Per patient this is her baseline. Psychiatric: Normal judgment and insight. Alert and oriented x 3. Normal mood.   EKG: independently reviewed, showing sinus rhythm with rate of 79, QTc 456  Chest x-ray on Admission: I personally reviewed and I agree with radiologist reading as below.  CT ABDOMEN PELVIS W CONTRAST  Result Date: 09/09/2022 CLINICAL DATA:  71 year old female with left lower quadrant pain. Recent CT Abdomen and Pelvis negative for diverticulitis. EXAM: CT ABDOMEN AND PELVIS WITH CONTRAST TECHNIQUE: Multidetector CT imaging of the abdomen and pelvis was performed using the standard protocol following bolus administration of intravenous contrast. RADIATION DOSE REDUCTION: This exam was performed according to the departmental dose-optimization program which includes automated exposure control, adjustment of the mA and/or kV according to patient size and/or use of iterative reconstruction technique. CONTRAST:  167m OMNIPAQUE IOHEXOL 300 MG/ML  SOLN COMPARISON:  CT Abdomen and Pelvis 09/06/2022 and earlier. FINDINGS: Lower chest: No pericardial or pleural effusion. Negative lung bases. Calcified aortic atherosclerosis. Hepatobiliary: Absent gallbladder.  Stable liver and biliary tree. Pancreas: Atrophied. Spleen: Negative. Adrenals/Urinary Tract: Stable, negative. Symmetric renal enhancement and contrast excretion. Unremarkable bladder. Stomach/Bowel:  Stable distal large bowel. Diverticulosis throughout the sigmoid colon. No active inflammation identified. Mostly decompressed upstream descending colon. Retained stool in the transverse and right colon. Diminutive or absent appendix. No dilated small bowel. Decompressed stomach and duodenum. No free air or free fluid. No mesenteric inflammation identified. Vascular/Lymphatic: Aortoiliac calcified atherosclerosis. Normal caliber abdominal aorta. Major arterial structures in the abdomen and pelvis remain patent. Portal venous system is patent. No lymphadenopathy identified. Reproductive: Absent uterus.  Diminutive or absent ovaries. Other: No pelvic free fluid.  Chronic pelvic phleboliths. Musculoskeletal: Previously augmented lower thoracic vertebrae. Underlying osteopenia. No acute osseous abnormality identified. IMPRESSION: 1. No acute or inflammatory process identified in the abdomen or pelvis. 2. Chronic sigmoid  diverticulosis, no active inflammation. 3. Aortic Atherosclerosis (ICD10-I70.0). Electronically Signed   By: Genevie Ann M.D.   On: 09/09/2022 12:54    Labs on Admission: I have personally reviewed following labs  CBC: Recent Labs  Lab 09/06/22 0942 09/09/22 0950  WBC 10.4 7.9  HGB 11.9* 12.0  HCT 36.5 36.5  MCV 82.0 80.8  PLT 245.0 151   Basic Metabolic Panel: Recent Labs  Lab 09/06/22 0942 09/09/22 0950  NA 136 139  K 3.5 3.1*  CL 103 109  CO2 23 21*  GLUCOSE 106* 109*  BUN 18 13  CREATININE 0.85 0.72  CALCIUM 8.9 8.8*  MG  --  1.8   GFR: Estimated Creatinine Clearance: 79 mL/min (by C-G formula based on SCr of 0.72 mg/dL).  Liver Function Tests: Recent Labs  Lab 09/06/22 0942 09/09/22 0950  AST 5 14*  ALT 1 6  ALKPHOS 125* 127*  BILITOT 0.6 0.8  PROT 5.9* 6.2*  ALBUMIN 3.3* 3.2*   Recent Labs  Lab 09/09/22 0950  LIPASE 23   Urine analysis:    Component Value Date/Time   COLORURINE YELLOW 09/09/2022 1331   APPEARANCEUR CLEAR 09/09/2022 1331    APPEARANCEUR Cloudy 09/18/2012 2000   LABSPEC 1.010 09/09/2022 1331   LABSPEC 1.012 09/18/2012 2000   PHURINE 7.0 09/09/2022 1331   GLUCOSEU NEGATIVE 09/09/2022 1331   GLUCOSEU Negative 09/18/2012 2000   HGBUR TRACE (A) 09/09/2022 1331   BILIRUBINUR NEGATIVE 09/09/2022 1331   BILIRUBINUR negative 10/29/2021 1110   BILIRUBINUR negative 04/08/2019 1103   BILIRUBINUR Negative 09/18/2012 2000   KETONESUR NEGATIVE 09/09/2022 1331   PROTEINUR NEGATIVE 09/09/2022 1331   UROBILINOGEN 0.2 10/29/2021 1110   UROBILINOGEN 0.2 05/25/2017 1743   NITRITE NEGATIVE 09/09/2022 1331   LEUKOCYTESUR SMALL (A) 09/09/2022 1331   LEUKOCYTESUR Trace 09/18/2012 2000   Dr. Tobie Poet [facility]  If 7PM-7AM, please contact overnight-coverage provider If 7AM-7PM, please contact day coverage provider [email or web address]  09/09/2022, 3:56 PM

## 2022-09-09 NOTE — ED Triage Notes (Signed)
Pt reports abd pain, NV & D with a little over a week. Pt reports hx of diverticulitis so went to her MD and had a CT scan done and was given phenergan for nausea and told her take imodium for diarrhea. Pt reports scan showed mild inflammation but she is worse that she was and she is out of her meds.

## 2022-09-09 NOTE — Assessment & Plan Note (Signed)
-   Resumed home topiramate 25 mg nightly

## 2022-09-09 NOTE — Assessment & Plan Note (Signed)
-   Appears to be at better than baseline

## 2022-09-09 NOTE — Assessment & Plan Note (Addendum)
-   Escitalopram 5 mg daily resumed - Ativan 0.5 mg IV every 6 hours as needed for breakthrough anxiety and panic attacks, 4 doses ordered

## 2022-09-09 NOTE — Assessment & Plan Note (Signed)
-   Check serum magnesium on admission - Potassium chloride 10 mill equivalent IV over 60 minutes, 4 doses ordered - Recheck potassium with BMP in the a.m.

## 2022-09-09 NOTE — Assessment & Plan Note (Signed)
-   Etiology work-up in progress - Check COVID/influenza A/influenza B PCR - Query gastroenteritis versus IBS

## 2022-09-09 NOTE — Hospital Course (Signed)
[name] is a 71 year old female with Parkinson's disease, depression, anxiety, GERD, complex migraines with aura, chronic anemia, who presents emergency department for chief concerns of abdominal pain, diarrhea and intractable nausea vomiting.  Initial vitals in the emergency department showed temperature 98.4, respiration rate of 20, heart rate of 71, blood pressure 163/91, SPO2 of 100% on room air.  Serum sodium is 139, potassium 3.1, chloride of 109, bicarb 21, BUN of 13, serum creatinine of 0.72, nonfasting blood glucose 109, WBC 7.9, hemoglobin 12, platelets of 297.  eGFR greater than 60.  Lactic acid was 1.8.  UA was positive for small leukocytes.  CT abdomen pelvis with contrast in the ED was read as no acute or inflammatory process identified in the abdomen or pelvis.  Chronic sigmoid diverticulosis, no active inflammation.  Aortic atherosclerosis.  ED treatment: Fentanyl 50 mcg IV, morphine 4 mg IV, ondansetron 4 mg IV, sodium chloride 500 mL bolus.

## 2022-09-10 DIAGNOSIS — R112 Nausea with vomiting, unspecified: Secondary | ICD-10-CM | POA: Diagnosis not present

## 2022-09-10 DIAGNOSIS — R55 Syncope and collapse: Secondary | ICD-10-CM | POA: Diagnosis not present

## 2022-09-10 LAB — C. DIFFICILE GDH AND TOXIN A/B
GDH ANTIGEN: NOT DETECTED
MICRO NUMBER:: 13956061
SPECIMEN QUALITY:: ADEQUATE
TOXIN A AND B: NOT DETECTED

## 2022-09-10 LAB — CBC
HCT: 32.4 % — ABNORMAL LOW (ref 36.0–46.0)
Hemoglobin: 10.4 g/dL — ABNORMAL LOW (ref 12.0–15.0)
MCH: 26.2 pg (ref 26.0–34.0)
MCHC: 32.1 g/dL (ref 30.0–36.0)
MCV: 81.6 fL (ref 80.0–100.0)
Platelets: 217 10*3/uL (ref 150–400)
RBC: 3.97 MIL/uL (ref 3.87–5.11)
RDW: 17 % — ABNORMAL HIGH (ref 11.5–15.5)
WBC: 5.3 10*3/uL (ref 4.0–10.5)
nRBC: 0 % (ref 0.0–0.2)

## 2022-09-10 LAB — BASIC METABOLIC PANEL
Anion gap: 3 — ABNORMAL LOW (ref 5–15)
BUN: 11 mg/dL (ref 8–23)
CO2: 28 mmol/L (ref 22–32)
Calcium: 8.1 mg/dL — ABNORMAL LOW (ref 8.9–10.3)
Chloride: 108 mmol/L (ref 98–111)
Creatinine, Ser: 0.7 mg/dL (ref 0.44–1.00)
GFR, Estimated: >60 mL/min (ref >60)
Glucose, Bld: 90 mg/dL (ref 70–99)
Potassium: 3.4 mmol/L — ABNORMAL LOW (ref 3.5–5.1)
Sodium: 139 mmol/L (ref 135–145)

## 2022-09-10 MED ORDER — METOCLOPRAMIDE HCL 5 MG/ML IJ SOLN
10.0000 mg | Freq: Three times a day (TID) | INTRAMUSCULAR | Status: DC
  Administered 2022-09-10 – 2022-09-11 (×3): 10 mg via INTRAVENOUS
  Filled 2022-09-10 (×3): qty 2

## 2022-09-10 MED ORDER — POTASSIUM CHLORIDE 10 MEQ/100ML IV SOLN
10.0000 meq | INTRAVENOUS | Status: AC
  Administered 2022-09-10 (×2): 10 meq via INTRAVENOUS

## 2022-09-10 MED ORDER — SODIUM CHLORIDE 0.9 % IV SOLN: INTRAVENOUS | Status: DC

## 2022-09-10 MED ORDER — SODIUM CHLORIDE 0.9 % IV SOLN
12.5000 mg | Freq: Four times a day (QID) | INTRAVENOUS | Status: AC | PRN
  Administered 2022-09-10 – 2022-09-11 (×2): 12.5 mg via INTRAVENOUS
  Filled 2022-09-10 (×2): qty 12.5

## 2022-09-10 MED ORDER — OXYCODONE HCL 5 MG PO TABS
5.0000 mg | ORAL_TABLET | ORAL | Status: DC | PRN
  Administered 2022-09-10 (×2): 10 mg via ORAL
  Administered 2022-09-11: 5 mg via ORAL
  Administered 2022-09-11 – 2022-09-13 (×8): 10 mg via ORAL
  Filled 2022-09-10 (×11): qty 2

## 2022-09-10 NOTE — Plan of Care (Signed)

## 2022-09-10 NOTE — Plan of Care (Signed)
  Problem: Health Behavior/Discharge Planning: Goal: Ability to manage health-related needs will improve Outcome: Progressing   Problem: Clinical Measurements: Goal: Ability to maintain clinical measurements within normal limits will improve Outcome: Progressing   Problem: Pain Managment: Goal: General experience of comfort will improve Outcome: Progressing   Problem: Safety: Goal: Ability to remain free from injury will improve Outcome: Progressing   Problem: Clinical Measurements: Goal: Respiratory complications will improve Outcome: Progressing   Problem: Clinical Measurements: Goal: Diagnostic test results will improve Outcome: Progressing   Problem: Nutrition: Goal: Adequate nutrition will be maintained Outcome: Progressing

## 2022-09-10 NOTE — Progress Notes (Signed)
PROGRESS NOTE    Morgan [name]  VQM:[id number] DOB: Jul 05, 1951 DOA: 09/09/2022 PCP: Morgan Noe, MD    Brief Narrative:  71 year old female with Parkinson's disease, depression, anxiety, GERD, complex migraines with aura, chronic anemia, who presents emergency department for chief concerns of abdominal pain, diarrhea and intractable nausea vomiting.   Initial vitals in the emergency department showed temperature 98.4, respiration rate of 20, heart rate of 71, blood pressure 163/91, SPO2 of 100% on room air.   Serum sodium is 139, potassium 3.1, chloride of 109, bicarb 21, BUN of 13, serum creatinine of 0.72, nonfasting blood glucose 109, WBC 7.9, hemoglobin 12, platelets of 297.  eGFR greater than 60.  Lactic acid was 1.8.  UA was positive for small leukocytes.  Abdominal CT with contrast read with no acute or inflammatory process in the abdomen or pelvis.  Chronic sigmoid diverticulosis.  Patient remains with nausea vomiting on 9/25.  Hyperactive bowel sounds.  No diarrhea but patient did take Imodium prior to admission.  Assessment & Plan:   Principal Problem:   Intractable nausea and vomiting Active Problems:   Hypokalemia   Parkinson disease (HCC)   GERD (gastroesophageal reflux disease)   Major depressive disorder, recurrent (HCC)   Anxiety   HLD (hyperlipidemia)   Chronic respiratory failure with hypoxia (HCC)   IDA (iron deficiency anemia)   Migraine headache   Generalized anxiety disorder   Unintentional weight loss * Intractable nausea and vomiting No clear etiology identified.  Abdominal CT negative.  COVID, influenza negative.  Possible viral gastroenteritis.  Unable to exclude bacterial gastroenteritis at this time but felt to be less likely. Plan: Continue IV fluids Scheduled Reglan 10 mg IV 3 times daily As needed Phenergan every 6 hours Follow-up C. difficile GI PCR panel Vitals and fever curve Clear liquid diet for now, can advance to full's as tolerated    Hypokalemia Secondary to intractable nausea and vomiting and diarrhea.  Monitor and replace as necessary  Parkinson disease ([place]) - Resumed home carbidopa-levodopa 25-100 mg tablet, 2 tablets, p.o., 3 times daily   Unintentional weight loss - Per patient approximately 100 pounds - Her PCP notes this and she is currently being worked up   Generalized anxiety disorder - Escitalopram 5 mg daily resumed - Ativan 0.5 mg IV every 6 hours as needed for breakthrough anxiety and panic attacks, 4 doses ordered   Migraine headache - Resumed home topiramate 25 mg nightly   IDA (iron deficiency anemia) - Appears to be at better than baseline   DVT prophylaxis: SQ Lovenox Code Status: Full Family Communication: None today.  Offered to call the patient declined Disposition Plan: Status is: Observation The patient will require care spanning > 2 midnights and should be moved to inpatient because: Intractable nausea and vomiting, persistent over interval.  Inability to tolerate p.o.  On scheduled and as needed IV antiemetics   Level of care: Med-Surg  Consultants:  None  Procedures:  None  Antimicrobials: None   Subjective: Seen and examined.  Resting in bed.  Appears uncomfortable.  Endorses abdominal pain.  Objective: Vitals:   09/09/22 1617 09/09/22 2018 09/10/22 0402 09/10/22 0734  BP: (!) 149/82 (!) 145/81 (!) 151/79 136/78  Pulse: 87 78 67 67  Resp: 16 17 16 18   Temp: 98.3 F (36.8 C) 98.9 F (37.2 C) 98.3 F (36.8 C) 97.9 F (36.6 C)  TempSrc:      SpO2: 100% 100% 100% 100%  Weight:  Height:        Intake/Output Summary (Last 24 hours) at 09/10/2022 1222 Last data filed at 09/10/2022 0413 Gross per 24 hour  Intake 959.6 ml  Output --  Net 959.6 ml   Filed Weights   09/09/22 0945  Weight: 83 kg    Examination:  General exam: Appears uncomfortable Respiratory system: Clear to auscultation. Respiratory effort normal. Cardiovascular system: S1-S2, RRR,  no murmurs, no pedal edema Gastrointestinal system: Soft, nondistended, nontender, hyperactive bowel sounds Central nervous system: Alert and oriented. No focal neurological deficits. Extremities: Symmetric 5 x 5 power. Skin: No rashes, lesions or ulcers Psychiatry: Judgement and insight appear normal. Mood & affect appropriate.     Data Reviewed: I have personally reviewed following labs and imaging studies  CBC: Recent Labs  Lab 09/06/22 0942 09/09/22 0950 09/10/22 0620  WBC 10.4 7.9 5.3  HGB 11.9* 12.0 10.4*  HCT 36.5 36.5 32.4*  MCV 82.0 80.8 81.6  PLT 245.0 297 867   Basic Metabolic Panel: Recent Labs  Lab 09/06/22 0942 09/09/22 0950 09/10/22 0620  NA 136 139 139  K 3.5 3.1* 3.4*  CL 103 109 108  CO2 23 21* 28  GLUCOSE 106* 109* 90  BUN 18 13 11   CREATININE 0.85 0.72 0.70  CALCIUM 8.9 8.8* 8.1*  MG  --  1.8  --    GFR: Estimated Creatinine Clearance: 79 mL/min (by C-G formula based on SCr of 0.7 mg/dL). Liver Function Tests: Recent Labs  Lab 09/06/22 0942 09/09/22 0950  AST 5 14*  ALT 1 6  ALKPHOS 125* 127*  BILITOT 0.6 0.8  PROT 5.9* 6.2*  ALBUMIN 3.3* 3.2*   Recent Labs  Lab 09/09/22 0950  LIPASE 23   No results for input(s): "AMMONIA" in the last 168 hours. Coagulation Profile: No results for input(s): "INR", "PROTIME" in the last 168 hours. Cardiac Enzymes: No results for input(s): "CKTOTAL", "CKMB", "CKMBINDEX", "TROPONINI" in the last 168 hours. BNP (last 3 results) No results for input(s): "PROBNP" in the last 8760 hours. HbA1C: No results for input(s): "HGBA1C" in the last 72 hours. CBG: No results for input(s): "GLUCAP" in the last 168 hours. Lipid Profile: No results for input(s): "CHOL", "HDL", "LDLCALC", "TRIG", "CHOLHDL", "LDLDIRECT" in the last 72 hours. Thyroid Function Tests: No results for input(s): "TSH", "T4TOTAL", "FREET4", "T3FREE", "THYROIDAB" in the last 72 hours. Anemia Panel: No results for input(s): "VITAMINB12",  "FOLATE", "FERRITIN", "TIBC", "IRON", "RETICCTPCT" in the last 72 hours. Sepsis Labs: Recent Labs  Lab 09/09/22 1129  LATICACIDVEN 1.8    Recent Results (from the past 240 hour(s))  Resp Panel by RT-PCR (Flu A&B, Covid) Anterior Nasal Swab     Status: None   Collection Time: 09/09/22  5:40 PM   Specimen: Anterior Nasal Swab  Result Value Ref Range Status   SARS Coronavirus 2 by RT PCR NEGATIVE NEGATIVE Final    Comment: (NOTE) SARS-CoV-2 target nucleic acids are NOT DETECTED.  The SARS-CoV-2 RNA is generally detectable in upper respiratory specimens during the acute phase of infection. The lowest concentration of SARS-CoV-2 viral copies this assay can detect is 138 copies/mL. A negative result does not preclude SARS-Cov-2 infection and should not be used as the sole basis for treatment or other patient management decisions. A negative result may occur with  improper specimen collection/handling, submission of specimen other than nasopharyngeal swab, presence of viral mutation(s) within the areas targeted by this assay, and inadequate number of viral copies(<138 copies/mL). A negative result must be combined  with clinical observations, patient history, and epidemiological information. The expected result is Negative.  Fact Sheet for Patients:  EntrepreneurPulse.com.au  Fact Sheet for Healthcare Providers:  IncredibleEmployment.be  This test is no t yet approved or cleared by the [place] FDA and  has been authorized for detection and/or diagnosis of SARS-CoV-2 by FDA under an Emergency Use Authorization (EUA). This EUA will remain  in effect (meaning this test can be used) for the duration of the COVID-19 declaration under Section 564(b)(1) of the Act, 21 U.S.C.section 360bbb-3(b)(1), unless the authorization is terminated  or revoked sooner.       Influenza A by PCR NEGATIVE NEGATIVE Final   Influenza B by PCR NEGATIVE NEGATIVE  Final    Comment: (NOTE) The Xpert Xpress SARS-CoV-2/FLU/RSV plus assay is intended as an aid in the diagnosis of influenza from Nasopharyngeal swab specimens and should not be used as a sole basis for treatment. Nasal washings and aspirates are unacceptable for Xpert Xpress SARS-CoV-2/FLU/RSV testing.  Fact Sheet for Patients: EntrepreneurPulse.com.au  Fact Sheet for Healthcare Providers: IncredibleEmployment.be  This test is not yet approved or cleared by the [place] FDA and has been authorized for detection and/or diagnosis of SARS-CoV-2 by FDA under an Emergency Use Authorization (EUA). This EUA will remain in effect (meaning this test can be used) for the duration of the COVID-19 declaration under Section 564(b)(1) of the Act, 21 U.S.C. section 360bbb-3(b)(1), unless the authorization is terminated or revoked.  Performed at [facility], [address] [place], [place] [address]          Radiology Studies: CT ABDOMEN PELVIS W CONTRAST  Result Date: 09/09/2022 CLINICAL DATA:  71 year old female with left lower quadrant pain. Recent CT Abdomen and Pelvis negative for diverticulitis. EXAM: CT ABDOMEN AND PELVIS WITH CONTRAST TECHNIQUE: Multidetector CT imaging of the abdomen and pelvis was performed using the standard protocol following bolus administration of intravenous contrast. RADIATION DOSE REDUCTION: This exam was performed according to the departmental dose-optimization program which includes automated exposure control, adjustment of the mA and/or kV according to patient size and/or use of iterative reconstruction technique. CONTRAST:  142m OMNIPAQUE IOHEXOL 300 MG/ML  SOLN COMPARISON:  CT Abdomen and Pelvis 09/06/2022 and earlier. FINDINGS: Lower chest: No pericardial or pleural effusion. Negative lung bases. Calcified aortic atherosclerosis. Hepatobiliary: Absent gallbladder.  Stable liver and biliary tree. Pancreas:  Atrophied. Spleen: Negative. Adrenals/Urinary Tract: Stable, negative. Symmetric renal enhancement and contrast excretion. Unremarkable bladder. Stomach/Bowel: Stable distal large bowel. Diverticulosis throughout the sigmoid colon. No active inflammation identified. Mostly decompressed upstream descending colon. Retained stool in the transverse and right colon. Diminutive or absent appendix. No dilated small bowel. Decompressed stomach and duodenum. No free air or free fluid. No mesenteric inflammation identified. Vascular/Lymphatic: Aortoiliac calcified atherosclerosis. Normal caliber abdominal aorta. Major arterial structures in the abdomen and pelvis remain patent. Portal venous system is patent. No lymphadenopathy identified. Reproductive: Absent uterus.  Diminutive or absent ovaries. Other: No pelvic free fluid.  Chronic pelvic phleboliths. Musculoskeletal: Previously augmented lower thoracic vertebrae. Underlying osteopenia. No acute osseous abnormality identified. IMPRESSION: 1. No acute or inflammatory process identified in the abdomen or pelvis. 2. Chronic sigmoid diverticulosis, no active inflammation. 3. Aortic Atherosclerosis (ICD10-I70.0). Electronically Signed   By: HGenevie AnnM.D.   On: 09/09/2022 12:54        Scheduled Meds:  carbidopa-levodopa  2 tablet Oral TID   enoxaparin (LOVENOX) injection  40 mg Subcutaneous Q24H   escitalopram  5 mg Oral Daily  metoCLOPramide (REGLAN) injection  10 mg Intravenous Q8H   topiramate  25 mg Oral QHS   Continuous Infusions:  sodium chloride 100 mL/hr at 09/10/22 1003     LOS: 0 days     Sidney Ace, MD [facility]   If 7PM-7AM, please contact night-coverage  09/10/2022, 12:22 PM

## 2022-09-10 NOTE — TOC Initial Note (Signed)
[facility]) - Initial/Assessment Note    Patient Details  Name: Morgan [name] MRN: [id number] Date of Birth: August 26, 1951  Transition of Care [facility]) CM/SW Contact:    Conception Oms, RN Phone Number: 09/10/2022, 4:34 PM  Clinical Narrative:                   Transition of Care [facility]) Screening Note   Patient Details  Name: Morgan [name] Date of Birth: [birth year]   Transition of Care [facility]) CM/SW Contact:    Conception Oms, RN Phone Number: 09/10/2022, 4:34 PM  [name] PCP [facility] to transport  Transition of Care Department [facility]) has reviewed patient and no TOC needs have been identified at this time. We will continue to monitor patient advancement through interdisciplinary progression rounds. If new patient transition needs arise, please place a TOC consult.         Patient Goals and CMS Choice        Expected Discharge Plan and Services                                                Prior Living Arrangements/Services                       Activities of Daily Living Home Assistive Devices/Equipment: None ADL Screening (condition at time of admission) Patient's cognitive ability adequate to safely complete daily activities?: Yes Is the patient deaf or have difficulty hearing?: No Does the patient have difficulty seeing, even when wearing glasses/contacts?: No Does the patient have difficulty concentrating, remembering, or making decisions?: No Patient able to express need for assistance with ADLs?: Yes Does the patient have difficulty dressing or bathing?: No Independently performs ADLs?: Yes (appropriate for developmental age) Does the patient have difficulty walking or climbing stairs?: No Weakness of Legs: None Weakness of Arms/Hands: None  Permission Sought/Granted                  Emotional Assessment              Admission diagnosis:  Intractable abdominal pain [R10.9] Intractable nausea  and vomiting [R11.2] Patient Active Problem List   Diagnosis Date Noted   Intractable nausea and vomiting 09/09/2022   Unintentional weight loss 09/09/2022   Generalized anxiety disorder 09/06/2022   Viral URI with cough 08/15/2022   Migraine headache 08/08/2022   Syncope and collapse 08/03/2022   Generalized abdominal pain    Parkinson disease ([place])    Normocytic anemia    Hemiplegic migraine, not intractable, 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   Vitamin B12 deficiency 02/20/2022   Vitamin D deficiency 02/20/2022   Hemiplegic migraine 01/24/2022   Acute ischemic stroke ([place]) 01/18/2022   Edema, unspecified 01/10/2022   Vascular dementia, unspecified severity, with anxiety ([place]) 01/10/2022   Chronic back pain 01/10/2022   Stage 3b chronic kidney disease ([place]) 01/09/2022   Anemia 01/09/2022   Tremor 10/27/2021   Acute on chronic diastolic CHF (congestive heart failure), NYHA class 3 ([place]) 10/25/2021   Microcytic anemia 09/11/2021   Stage 3a chronic kidney disease ([place]) 09/11/2021   IDA (iron deficiency anemia) 09/11/2021   Elevated TSH 08/28/2021   Osteopenia 08/28/2021   DOE (dyspnea on exertion) 08/28/2021   Other chest pain 02/20/2021   Chronic diastolic CHF (congestive heart failure) ([place])  08/09/2020   COPD (chronic obstructive pulmonary disease) ([place]) 08/09/2020   Chronic respiratory failure with hypoxia ([place]) 08/09/2020   Polyp of sigmoid colon    Polyp of cecum    Gastritis and gastroduodenitis    Pylorus ulcer    Pyloric stenosis in adult    Chronic cholecystitis 05/19/2020   Oxygen dependent 03/30/2020   Nausea and vomiting in adult 03/22/2020   Peptic ulcer disease 03/22/2020   Iron deficiency anemia 03/22/2020   Chronic abdominal pain 02/19/2019   CKD (chronic kidney disease) stage 3, GFR 30-59 ml/min ([place]) 01/13/2019   CHF (congestive heart failure) ([place]) 01/13/2019   Heart failure, unspecified ([place]) 01/13/2019   Hoarseness 11/12/2018   Sleepiness  11/12/2018   Oropharyngeal dysphagia 11/12/2018   Drowsy 11/12/2018   Cerebrovascular accident ([place]) 08/22/2018   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[place]) 05/08/2018   Non-rheumatic mitral regurgitation 11/21/2017   Left-sided weakness 10/04/2017   HLD (hyperlipidemia) 10/04/2017   GERD (gastroesophageal reflux disease) 10/04/2017   Hyperlipidemia, unspecified 10/04/2017   Left hemiparesis ([place]) 10/04/2017   Gastroesophageal reflux disease 10/04/2017   Anxiety 08/26/2017   Essential hypertension    Major depressive disorder, recurrent ([place])    Hypokalemia 02/22/2015   Chronic lower back pain 04/29/2013   Paresthesia 04/29/2013   PCP:  [name], MD Pharmacy:   [id number] [address] [place]Phone: [phone]: [address] [place]Phone: [phone]: [phone]    Social Determinants of Health (SDOH) Interventions    Readmission Risk Interventions    08/06/2022   11:49 AM  Readmission Risk Prevention Plan  Transportation Screening Complete  PCP or Specialist Appt within 3-5 Days Complete  Social Work Consult for RQuinnesecPlanning/Counseling Complete  Palliative Care Screening Not Applicable  Medication Review (Press photographer Complete

## 2022-09-11 DIAGNOSIS — F339 Major depressive disorder, recurrent, unspecified: Secondary | ICD-10-CM | POA: Diagnosis present

## 2022-09-11 DIAGNOSIS — K529 Noninfective gastroenteritis and colitis, unspecified: Secondary | ICD-10-CM | POA: Diagnosis not present

## 2022-09-11 DIAGNOSIS — E785 Hyperlipidemia, unspecified: Secondary | ICD-10-CM | POA: Diagnosis not present

## 2022-09-11 DIAGNOSIS — Z83438 Family history of other disorder of lipoprotein metabolism and other lipidemia: Secondary | ICD-10-CM | POA: Diagnosis not present

## 2022-09-11 DIAGNOSIS — J449 Chronic obstructive pulmonary disease, unspecified: Secondary | ICD-10-CM | POA: Diagnosis not present

## 2022-09-11 DIAGNOSIS — K219 Gastro-esophageal reflux disease without esophagitis: Secondary | ICD-10-CM | POA: Diagnosis not present

## 2022-09-11 DIAGNOSIS — F411 Generalized anxiety disorder: Secondary | ICD-10-CM | POA: Diagnosis not present

## 2022-09-11 DIAGNOSIS — R69 Illness, unspecified: Secondary | ICD-10-CM | POA: Diagnosis not present

## 2022-09-11 DIAGNOSIS — Z79899 Other long term (current) drug therapy: Secondary | ICD-10-CM | POA: Diagnosis not present

## 2022-09-11 DIAGNOSIS — G20 Parkinson's disease: Secondary | ICD-10-CM | POA: Diagnosis not present

## 2022-09-11 DIAGNOSIS — F419 Anxiety disorder, unspecified: Secondary | ICD-10-CM | POA: Diagnosis not present

## 2022-09-11 DIAGNOSIS — M1990 Unspecified osteoarthritis, unspecified site: Secondary | ICD-10-CM | POA: Diagnosis not present

## 2022-09-11 DIAGNOSIS — K5730 Diverticulosis of large intestine without perforation or abscess without bleeding: Secondary | ICD-10-CM | POA: Diagnosis not present

## 2022-09-11 DIAGNOSIS — J9611 Chronic respiratory failure with hypoxia: Secondary | ICD-10-CM | POA: Diagnosis not present

## 2022-09-11 DIAGNOSIS — Z800 Family history of malignant neoplasm of digestive organs: Secondary | ICD-10-CM | POA: Diagnosis not present

## 2022-09-11 DIAGNOSIS — E876 Hypokalemia: Secondary | ICD-10-CM | POA: Diagnosis not present

## 2022-09-11 DIAGNOSIS — Z8249 Family history of ischemic heart disease and other diseases of the circulatory system: Secondary | ICD-10-CM | POA: Diagnosis not present

## 2022-09-11 DIAGNOSIS — Z20822 Contact with and (suspected) exposure to covid-19: Secondary | ICD-10-CM | POA: Diagnosis not present

## 2022-09-11 DIAGNOSIS — Z8719 Personal history of other diseases of the digestive system: Secondary | ICD-10-CM | POA: Diagnosis not present

## 2022-09-11 DIAGNOSIS — I5032 Chronic diastolic (congestive) heart failure: Secondary | ICD-10-CM | POA: Diagnosis not present

## 2022-09-11 DIAGNOSIS — G43109 Migraine with aura, not intractable, without status migrainosus: Secondary | ICD-10-CM | POA: Diagnosis not present

## 2022-09-11 DIAGNOSIS — R634 Abnormal weight loss: Secondary | ICD-10-CM | POA: Diagnosis not present

## 2022-09-11 DIAGNOSIS — I700 Atherosclerosis of aorta: Secondary | ICD-10-CM | POA: Diagnosis not present

## 2022-09-11 DIAGNOSIS — D509 Iron deficiency anemia, unspecified: Secondary | ICD-10-CM | POA: Diagnosis not present

## 2022-09-11 DIAGNOSIS — Z87891 Personal history of nicotine dependence: Secondary | ICD-10-CM | POA: Diagnosis not present

## 2022-09-11 DIAGNOSIS — R112 Nausea with vomiting, unspecified: Secondary | ICD-10-CM | POA: Diagnosis not present

## 2022-09-11 DIAGNOSIS — Z90710 Acquired absence of both cervix and uterus: Secondary | ICD-10-CM | POA: Diagnosis not present

## 2022-09-11 MED ORDER — SODIUM CHLORIDE 0.9 % IV SOLN
12.5000 mg | Freq: Four times a day (QID) | INTRAVENOUS | Status: DC | PRN
  Administered 2022-09-11 – 2022-09-14 (×6): 12.5 mg via INTRAVENOUS
  Filled 2022-09-11 (×3): qty 0.5
  Filled 2022-09-11: qty 12.5
  Filled 2022-09-11: qty 0.5
  Filled 2022-09-11: qty 12.5

## 2022-09-11 MED ORDER — FAMOTIDINE 20 MG PO TABS
20.0000 mg | ORAL_TABLET | Freq: Two times a day (BID) | ORAL | Status: DC
  Administered 2022-09-11 – 2022-09-14 (×7): 20 mg via ORAL
  Filled 2022-09-11 (×7): qty 1

## 2022-09-11 MED ORDER — DICYCLOMINE HCL 20 MG PO TABS
20.0000 mg | ORAL_TABLET | Freq: Three times a day (TID) | ORAL | Status: DC
  Administered 2022-09-11 – 2022-09-14 (×12): 20 mg via ORAL
  Filled 2022-09-11 (×12): qty 1

## 2022-09-11 NOTE — Progress Notes (Signed)
PROGRESS NOTE    Morgan [name]  OMB:[id number] DOB: [birth year] DOA: 09/09/2022 PCP: [name], MD    Brief Narrative:  71 year old female with Parkinson's disease, depression, anxiety, GERD, complex migraines with aura, chronic anemia, who presents emergency department for chief concerns of abdominal pain, diarrhea and intractable nausea vomiting.   Initial vitals in the emergency department showed temperature 98.4, respiration rate of 20, heart rate of 71, blood pressure 163/91, SPO2 of 100% on room air.   Serum sodium is 139, potassium 3.1, chloride of 109, bicarb 21, BUN of 13, serum creatinine of 0.72, nonfasting blood glucose 109, WBC 7.9, hemoglobin 12, platelets of 297.  eGFR greater than 60.  Lactic acid was 1.8.  UA was positive for small leukocytes.  Abdominal CT with contrast read with no acute or inflammatory process in the abdomen or pelvis.  Chronic sigmoid diverticulosis.  Patient remains with nausea vomiting on 9/25.  Hyperactive bowel sounds.  No diarrhea but patient did take Imodium prior to admission.  9/26: Patient remains symptomatic.  Endorsing abdominal pain associated with nausea and inability to tolerate p.o.  Unclear etiology.  Assessment & Plan:   Principal Problem:   Intractable nausea and vomiting Active Problems:   Hypokalemia   Parkinson disease (HCC)   GERD (gastroesophageal reflux disease)   Major depressive disorder, recurrent (HCC)   Anxiety   HLD (hyperlipidemia)   Chronic respiratory failure with hypoxia (HCC)   IDA (iron deficiency anemia)   Migraine headache   Generalized anxiety disorder   Unintentional weight loss * Intractable nausea and vomiting Intractable abdominal pain No clear etiology identified.  Abdominal CT negative.  COVID, influenza negative.  Possible viral gastroenteritis.  Unable to exclude bacterial gastroenteritis at this time but felt to be less likely. Plan: Continue IV fluids DC scheduled Reglan, relative  contraindication in setting of Sinemet use As needed Phenergan every 6 hours Follow-up C. difficile GI PCR panel, patient without diarrhea.  Felt unlikely Vitals and fever curve Full liquid diet as tolerated, advance to soft 4 times daily Bentyl Twice daily Pepcid   Hypokalemia Secondary to intractable nausea and vomiting and diarrhea.  Monitor and replace as necessary  Parkinson disease ([place]) - Resumed home carbidopa-levodopa 25-100 mg tablet, 2 tablets, p.o., 3 times daily   Unintentional weight loss - Per patient approximately 100 pounds - Her PCP notes this and she is currently being worked up   Generalized anxiety disorder - Escitalopram 5 mg daily resumed - Ativan 0.5 mg IV every 6 hours as needed for breakthrough anxiety and panic attacks, 4 doses ordered   Migraine headache - Resumed home topiramate 25 mg nightly   IDA (iron deficiency anemia) - Appears to be at better than baseline   DVT prophylaxis: SQ Lovenox Code Status: Full Family Communication: None today.  Offered to call the patient declined Disposition Plan: Status is: Inpatient Remains inpatient appropriate because: Intractable nausea and vomiting, abdominal pain, poor ability to tolerate p.o.     Level of care: Med-Surg  Consultants:  None  Procedures:  None  Antimicrobials: None   Subjective: Seen and examined.  Resting in bed.  Appears uncomfortable.  Endorses abdominal pain.  Objective: Vitals:   09/10/22 1747 09/10/22 2131 09/11/22 0557 09/11/22 0954  BP: 109/60 135/72 136/72 113/65  Pulse: 71 65 64 71  Resp: 18 18  17   Temp: 97.9 F (36.6 C) 98.5 F (36.9 C) 97.8 F (36.6 C) 98.8 F (37.1 C)  TempSrc:  Oral  SpO2: 100% 100% 100% 100%  Weight:      Height:        Intake/Output Summary (Last 24 hours) at 09/11/2022 1337 Last data filed at 09/10/2022 1700 Gross per 24 hour  Intake 558.82 ml  Output --  Net 558.82 ml   Filed Weights   09/09/22 0945  Weight: 83 kg     Examination:  General exam: Uncomfortable appearing Respiratory system: Clear to auscultation. Respiratory effort normal. Cardiovascular system: S1-S2, RRR, no murmurs, no pedal edema Gastrointestinal system: Soft, nondistended, mild TTP lower abdomen, normal bowel sounds Central nervous system: Alert and oriented. No focal neurological deficits. Extremities: Symmetric 5 x 5 power. Skin: No rashes, lesions or ulcers Psychiatry: Judgement and insight appear normal. Mood & affect appropriate.     Data Reviewed: I have personally reviewed following labs and imaging studies  CBC: Recent Labs  Lab 09/06/22 0942 09/09/22 0950 09/10/22 0620  WBC 10.4 7.9 5.3  HGB 11.9* 12.0 10.4*  HCT 36.5 36.5 32.4*  MCV 82.0 80.8 81.6  PLT 245.0 297 353   Basic Metabolic Panel: Recent Labs  Lab 09/06/22 0942 09/09/22 0950 09/10/22 0620  NA 136 139 139  K 3.5 3.1* 3.4*  CL 103 109 108  CO2 23 21* 28  GLUCOSE 106* 109* 90  BUN 18 13 11   CREATININE 0.85 0.72 0.70  CALCIUM 8.9 8.8* 8.1*  MG  --  1.8  --    GFR: Estimated Creatinine Clearance: 79 mL/min (by C-G formula based on SCr of 0.7 mg/dL). Liver Function Tests: Recent Labs  Lab 09/06/22 0942 09/09/22 0950  AST 5 14*  ALT 1 6  ALKPHOS 125* 127*  BILITOT 0.6 0.8  PROT 5.9* 6.2*  ALBUMIN 3.3* 3.2*   Recent Labs  Lab 09/09/22 0950  LIPASE 23   No results for input(s): "AMMONIA" in the last 168 hours. Coagulation Profile: No results for input(s): "INR", "PROTIME" in the last 168 hours. Cardiac Enzymes: No results for input(s): "CKTOTAL", "CKMB", "CKMBINDEX", "TROPONINI" in the last 168 hours. BNP (last 3 results) No results for input(s): "PROBNP" in the last 8760 hours. HbA1C: No results for input(s): "HGBA1C" in the last 72 hours. CBG: No results for input(s): "GLUCAP" in the last 168 hours. Lipid Profile: No results for input(s): "CHOL", "HDL", "LDLCALC", "TRIG", "CHOLHDL", "LDLDIRECT" in the last 72  hours. Thyroid Function Tests: No results for input(s): "TSH", "T4TOTAL", "FREET4", "T3FREE", "THYROIDAB" in the last 72 hours. Anemia Panel: No results for input(s): "VITAMINB12", "FOLATE", "FERRITIN", "TIBC", "IRON", "RETICCTPCT" in the last 72 hours. Sepsis Labs: Recent Labs  Lab 09/09/22 1129  LATICACIDVEN 1.8    Recent Results (from the past 240 hour(s))  Resp Panel by RT-PCR (Flu A&B, Covid) Anterior Nasal Swab     Status: None   Collection Time: 09/09/22  5:40 PM   Specimen: Anterior Nasal Swab  Result Value Ref Range Status   SARS Coronavirus 2 by RT PCR NEGATIVE NEGATIVE Final    Comment: (NOTE) SARS-CoV-2 target nucleic acids are NOT DETECTED.  The SARS-CoV-2 RNA is generally detectable in upper respiratory specimens during the acute phase of infection. The lowest concentration of SARS-CoV-2 viral copies this assay can detect is 138 copies/mL. A negative result does not preclude SARS-Cov-2 infection and should not be used as the sole basis for treatment or other patient management decisions. A negative result may occur with  improper specimen collection/handling, submission of specimen other than nasopharyngeal swab, presence of viral mutation(s) within the areas targeted by  this assay, and inadequate number of viral copies(<138 copies/mL). A negative result must be combined with clinical observations, patient history, and epidemiological information. The expected result is Negative.  Fact Sheet for Patients:  EntrepreneurPulse.com.au  Fact Sheet for Healthcare Providers:  IncredibleEmployment.be  This test is no t yet approved or cleared by the [place] FDA and  has been authorized for detection and/or diagnosis of SARS-CoV-2 by FDA under an Emergency Use Authorization (EUA). This EUA will remain  in effect (meaning this test can be used) for the duration of the COVID-19 declaration under Section 564(b)(1) of the Act,  21 U.S.C.section 360bbb-3(b)(1), unless the authorization is terminated  or revoked sooner.       Influenza A by PCR NEGATIVE NEGATIVE Final   Influenza B by PCR NEGATIVE NEGATIVE Final    Comment: (NOTE) The Xpert Xpress SARS-CoV-2/FLU/RSV plus assay is intended as an aid in the diagnosis of influenza from Nasopharyngeal swab specimens and should not be used as a sole basis for treatment. Nasal washings and aspirates are unacceptable for Xpert Xpress SARS-CoV-2/FLU/RSV testing.  Fact Sheet for Patients: EntrepreneurPulse.com.au  Fact Sheet for Healthcare Providers: IncredibleEmployment.be  This test is not yet approved or cleared by the [place] FDA and has been authorized for detection and/or diagnosis of SARS-CoV-2 by FDA under an Emergency Use Authorization (EUA). This EUA will remain in effect (meaning this test can be used) for the duration of the COVID-19 declaration under Section 564(b)(1) of the Act, 21 U.S.C. section 360bbb-3(b)(1), unless the authorization is terminated or revoked.  Performed at [facility], [address] [place], [place] [address]          Radiology Studies: No results found.      Scheduled Meds:  carbidopa-levodopa  2 tablet Oral TID   dicyclomine  20 mg Oral TID AC & HS   enoxaparin (LOVENOX) injection  40 mg Subcutaneous Q24H   escitalopram  5 mg Oral Daily   famotidine  20 mg Oral BID   metoCLOPramide (REGLAN) injection  10 mg Intravenous Q8H   topiramate  25 mg Oral QHS   Continuous Infusions:  sodium chloride 100 mL/hr at 09/11/22 1217   promethazine (PHENERGAN) injection (IM or IVPB) 12.5 mg (09/11/22 1215)     LOS: 0 days     Sidney Ace, MD [facility]   If 7PM-7AM, please contact night-coverage  09/11/2022, 1:37 PM

## 2022-09-11 NOTE — Plan of Care (Signed)
  Problem: Nutrition: Goal: Adequate nutrition will be maintained Outcome: Progressing   Problem: Coping: Goal: Level of anxiety will decrease Outcome: Progressing   Problem: Elimination: Goal: Will not experience complications related to bowel motility Outcome: Progressing   Problem: Elimination: Goal: Will not experience complications related to urinary retention Outcome: Progressing   Problem: Pain Managment: Goal: General experience of comfort will improve Outcome: Progressing   Problem: Safety: Goal: Ability to remain free from injury will improve Outcome: Progressing   Problem: Skin Integrity: Goal: Risk for impaired skin integrity will decrease Outcome: Progressing   

## 2022-09-11 NOTE — Plan of Care (Signed)
  Problem: Education: Goal: Knowledge of General Education information will improve Description: Including pain rating scale, medication(s)/side effects and non-pharmacologic comfort measures Outcome: Progressing   Problem: Health Behavior/Discharge Planning: Goal: Ability to manage health-related needs will improve Outcome: Progressing   Problem: Clinical Measurements: Goal: Cardiovascular complication will be avoided Outcome: Progressing   Problem: Nutrition: Goal: Adequate nutrition will be maintained Outcome: Not Progressing   Problem: Coping: Goal: Level of anxiety will decrease Outcome: Progressing   Problem: Elimination: Goal: Will not experience complications related to urinary retention Outcome: Progressing   Problem: Pain Managment: Goal: General experience of comfort will improve Outcome: Not Progressing   Problem: Safety: Goal: Ability to remain free from injury will improve Outcome: Progressing   Problem: Education: Goal: Knowledge of General Education information will improve Description: Including pain rating scale, medication(s)/side effects and non-pharmacologic comfort measures Outcome: Progressing   Problem: Health Behavior/Discharge Planning: Goal: Ability to manage health-related needs will improve Outcome: Progressing   Problem: Coping: Goal: Level of anxiety will decrease Outcome: Progressing

## 2022-09-11 NOTE — Plan of Care (Signed)
  Problem: Health Behavior/Discharge Planning: Goal: Ability to manage health-related needs will improve Outcome: Progressing   

## 2022-09-12 ENCOUNTER — Encounter: Payer: Self-pay | Admitting: Internal Medicine

## 2022-09-12 DIAGNOSIS — E876 Hypokalemia: Secondary | ICD-10-CM | POA: Diagnosis not present

## 2022-09-12 DIAGNOSIS — J9611 Chronic respiratory failure with hypoxia: Secondary | ICD-10-CM

## 2022-09-12 DIAGNOSIS — F419 Anxiety disorder, unspecified: Secondary | ICD-10-CM

## 2022-09-12 DIAGNOSIS — R112 Nausea with vomiting, unspecified: Secondary | ICD-10-CM | POA: Diagnosis not present

## 2022-09-12 LAB — CBC WITH DIFFERENTIAL/PLATELET
Abs Immature Granulocytes: 0.01 10*3/uL (ref 0.00–0.07)
Basophils Absolute: 0 10*3/uL (ref 0.0–0.1)
Basophils Relative: 0 %
Eosinophils Absolute: 0.2 10*3/uL (ref 0.0–0.5)
Eosinophils Relative: 4 %
HCT: 28.8 % — ABNORMAL LOW (ref 36.0–46.0)
Hemoglobin: 9.1 g/dL — ABNORMAL LOW (ref 12.0–15.0)
Immature Granulocytes: 0 %
Lymphocytes Relative: 37 %
Lymphs Abs: 2.1 10*3/uL (ref 0.7–4.0)
MCH: 26.5 pg (ref 26.0–34.0)
MCHC: 31.6 g/dL (ref 30.0–36.0)
MCV: 83.7 fL (ref 80.0–100.0)
Monocytes Absolute: 0.4 10*3/uL (ref 0.1–1.0)
Monocytes Relative: 7 %
Neutro Abs: 2.8 10*3/uL (ref 1.7–7.7)
Neutrophils Relative %: 52 %
Platelets: 201 10*3/uL (ref 150–400)
RBC: 3.44 MIL/uL — ABNORMAL LOW (ref 3.87–5.11)
RDW: 17.4 % — ABNORMAL HIGH (ref 11.5–15.5)
WBC: 5.5 10*3/uL (ref 4.0–10.5)
nRBC: 0 % (ref 0.0–0.2)

## 2022-09-12 LAB — BASIC METABOLIC PANEL
Anion gap: 4 — ABNORMAL LOW (ref 5–15)
BUN: 6 mg/dL — ABNORMAL LOW (ref 8–23)
CO2: 28 mmol/L (ref 22–32)
Calcium: 8.1 mg/dL — ABNORMAL LOW (ref 8.9–10.3)
Chloride: 109 mmol/L (ref 98–111)
Creatinine, Ser: 0.68 mg/dL (ref 0.44–1.00)
GFR, Estimated: >60 mL/min (ref >60)
Glucose, Bld: 94 mg/dL (ref 70–99)
Potassium: 3.5 mmol/L (ref 3.5–5.1)
Sodium: 141 mmol/L (ref 135–145)

## 2022-09-12 LAB — GASTROINTESTINAL PATHOGEN PNL
CampyloBacter Group: NOT DETECTED
Norovirus GI/GII: NOT DETECTED
Rotavirus A: NOT DETECTED
Salmonella species: NOT DETECTED
Shiga Toxin 1: NOT DETECTED
Shiga Toxin 2: NOT DETECTED
Shigella Species: NOT DETECTED
Vibrio Group: NOT DETECTED
Yersinia enterocolitica: NOT DETECTED

## 2022-09-12 LAB — GIARDIA ANTIGEN
MICRO NUMBER:: 13955996
RESULT:: NOT DETECTED
SPECIMEN QUALITY:: ADEQUATE

## 2022-09-12 LAB — MAGNESIUM: Magnesium: 1.7 mg/dL (ref 1.7–2.4)

## 2022-09-12 MED ORDER — POTASSIUM CHLORIDE CRYS ER 20 MEQ PO TBCR
40.0000 meq | EXTENDED_RELEASE_TABLET | Freq: Once | ORAL | Status: AC
  Administered 2022-09-12: 40 meq via ORAL
  Filled 2022-09-12: qty 2

## 2022-09-12 NOTE — Progress Notes (Addendum)
Progress Note    Morgan [name]  RDE:[id number] DOB: 1951-03-07  DOA: 09/09/2022 PCP: [name], MD      Brief Narrative:    Medical records reviewed and are as summarized below:  Morgan [name] is a 71 y.o. female with Parkinson's disease, COPD, chronic diastolic CHF, chronic hypoxic respiratory failure on 3 to 4 L/min oxygen at night, depression, anxiety, GERD, complex migraines with aura, chronic anemia, who presents emergency department for chief concerns of abdominal pain, diarrhea and intractable nausea vomiting.     Assessment/Plan:   Principal Problem:   Intractable nausea and vomiting Active Problems:   Hypokalemia   Parkinson disease (HCC)   GERD (gastroesophageal reflux disease)   Major depressive disorder, recurrent (HCC)   Anxiety   HLD (hyperlipidemia)   Chronic respiratory failure with hypoxia (HCC)   IDA (iron deficiency anemia)   Migraine headache   Generalized anxiety disorder   Unintentional weight loss    Body mass index is 24.14 kg/m.  Intractable nausea, vomiting, abdominal pain and diarrhea, likely from gastroenteritis: She had nausea and vomited once this morning but overall symptoms are improving.  Advance diet as tolerated.  Discontinue IV fluids.  Ambulate with assistance.  Hypokalemia: Improved.  Continue potassium repletion.  Parkinson's disease: Continue carbidopa-levodopa  COPD, chronic hypoxic respiratory failure: Continue bronchodilators as needed.  She uses 3 to 4 L/min oxygen at night at baseline.  Chronic diastolic CHF: Compensated.  Unintentional weight loss: Follow-up with PCP as an outpatient for additional work-up  Other comorbidities include generalized anxiety disorder, migraine headaches, iron deficiency anemia   Diet Order             Diet regular Room service appropriate? Yes; Fluid consistency: Thin  Diet effective now                             Consultants: None  Procedures: None    Medications:    carbidopa-levodopa  2 tablet Oral TID   dicyclomine  20 mg Oral TID AC & HS   enoxaparin (LOVENOX) injection  40 mg Subcutaneous Q24H   escitalopram  5 mg Oral Daily   famotidine  20 mg Oral BID   potassium chloride  40 mEq Oral Once   topiramate  25 mg Oral QHS   Continuous Infusions:  promethazine (PHENERGAN) injection (IM or IVPB) 12.5 mg (09/12/22 1249)     Anti-infectives (From admission, onward)    None              Family Communication/Anticipated D/C date and plan/Code Status   DVT prophylaxis: enoxaparin (LOVENOX) injection 40 mg Start: 09/09/22 2200 Place TED hose Start: 09/09/22 1457     Code Status: Full Code  Family Communication: None Disposition Plan: To discharge home tomorrow   Status is: Inpatient Remains inpatient appropriate because: Abdominal pain and vomiting, advance diet        Subjective:   Interval events noted.  She complains of nausea.  She vomited once this morning.  No diarrhea.  She still has some abdominal pain.  She said she had eating at a [employer] a few days prior to admission.  Objective:    Vitals:   09/11/22 0954 09/11/22 1606 09/11/22 2326 09/12/22 0921  BP: 113/65 (!) 140/76 109/62 137/69  Pulse: 71 72 64 68  Resp: '17 17 16 18  '$ Temp: 98.8 F (37.1 C) 98.6 F (37 C) 97.9  F (36.6 C) 98.1 F (36.7 C)  TempSrc:      SpO2: 100% 100% 100% 100%  Weight:      Height:       No data found.   Intake/Output Summary (Last 24 hours) at 09/12/2022 1635 Last data filed at 09/12/2022 1411 Gross per 24 hour  Intake 2173.95 ml  Output 0 ml  Net 2173.95 ml   Filed Weights   09/09/22 0945  Weight: 83 kg    Exam:  GEN: NAD SKIN: No rash EYES: EOMI ENT: MMM CV: RRR PULM: CTA B ABD: soft, ND, mild mid abdominal tenderness, no rebound tenderness or guarding, +BS CNS: AAO x 3, non focal EXT: No edema or  tenderness        Data Reviewed:   I have personally reviewed following labs and imaging studies:  Labs: Labs show the following:   Basic Metabolic Panel: Recent Labs  Lab 09/06/22 0942 09/09/22 0950 09/10/22 0620 09/12/22 0522  NA 136 139 139 141  K 3.5 3.1* 3.4* 3.5  CL 103 109 108 109  CO2 23 21* 28 28  GLUCOSE 106* 109* 90 94  BUN '18 13 11 '$ 6*  CREATININE 0.85 0.72 0.70 0.68  CALCIUM 8.9 8.8* 8.1* 8.1*  MG  --  1.8  --  1.7   GFR Estimated Creatinine Clearance: 79 mL/min (by C-G formula based on SCr of 0.68 mg/dL). Liver Function Tests: Recent Labs  Lab 09/06/22 0942 09/09/22 0950  AST 5 14*  ALT 1 6  ALKPHOS 125* 127*  BILITOT 0.6 0.8  PROT 5.9* 6.2*  ALBUMIN 3.3* 3.2*   Recent Labs  Lab 09/09/22 0950  LIPASE 23   No results for input(s): "AMMONIA" in the last 168 hours. Coagulation profile No results for input(s): "INR", "PROTIME" in the last 168 hours.  CBC: Recent Labs  Lab 09/06/22 0942 09/09/22 0950 09/10/22 0620 09/12/22 0522  WBC 10.4 7.9 5.3 5.5  NEUTROABS  --   --   --  2.8  HGB 11.9* 12.0 10.4* 9.1*  HCT 36.5 36.5 32.4* 28.8*  MCV 82.0 80.8 81.6 83.7  PLT 245.0 297 217 201   Cardiac Enzymes: No results for input(s): "CKTOTAL", "CKMB", "CKMBINDEX", "TROPONINI" in the last 168 hours. BNP (last 3 results) No results for input(s): "PROBNP" in the last 8760 hours. CBG: No results for input(s): "GLUCAP" in the last 168 hours. D-Dimer: No results for input(s): "DDIMER" in the last 72 hours. Hgb A1c: No results for input(s): "HGBA1C" in the last 72 hours. Lipid Profile: No results for input(s): "CHOL", "HDL", "LDLCALC", "TRIG", "CHOLHDL", "LDLDIRECT" in the last 72 hours. Thyroid function studies: No results for input(s): "TSH", "T4TOTAL", "T3FREE", "THYROIDAB" in the last 72 hours.  Invalid input(s): "FREET3" Anemia work up: No results for input(s): "VITAMINB12", "FOLATE", "FERRITIN", "TIBC", "IRON", "RETICCTPCT" in the last  72 hours. Sepsis Labs: Recent Labs  Lab 09/06/22 0942 09/09/22 0950 09/09/22 1129 09/10/22 0620 09/12/22 0522  WBC 10.4 7.9  --  5.3 5.5  LATICACIDVEN  --   --  1.8  --   --     Microbiology Recent Results (from the past 240 hour(s))  Giardia antigen     Status: None   Collection Time: 09/07/22  3:18 PM   Specimen: Stool  Result Value Ref Range Status   MICRO NUMBER: [id number]  Final   SPECIMEN QUALITY: Adequate  Final   Source: STOOL  Final   STATUS: FINAL  Final   RESULT: Not Detected  Final    Comment: Reference Range:Not Detected   COMMENT:   Final    NOTE: Due to intermittent shedding, one negative sample does not necessarily rule out the presence of a parasitic infection.  Resp Panel by RT-PCR (Flu A&B, Covid) Anterior Nasal Swab     Status: None   Collection Time: 09/09/22  5:40 PM   Specimen: Anterior Nasal Swab  Result Value Ref Range Status   SARS Coronavirus 2 by RT PCR NEGATIVE NEGATIVE Final    Comment: (NOTE) SARS-CoV-2 target nucleic acids are NOT DETECTED.  The SARS-CoV-2 RNA is generally detectable in upper respiratory specimens during the acute phase of infection. The lowest concentration of SARS-CoV-2 viral copies this assay can detect is 138 copies/mL. A negative result does not preclude SARS-Cov-2 infection and should not be used as the sole basis for treatment or other patient management decisions. A negative result may occur with  improper specimen collection/handling, submission of specimen other than nasopharyngeal swab, presence of viral mutation(s) within the areas targeted by this assay, and inadequate number of viral copies(<138 copies/mL). A negative result must be combined with clinical observations, patient history, and epidemiological information. The expected result is Negative.  Fact Sheet for Patients:  EntrepreneurPulse.com.au  Fact Sheet for Healthcare Providers:   IncredibleEmployment.be  This test is no t yet approved or cleared by the [place] FDA and  has been authorized for detection and/or diagnosis of SARS-CoV-2 by FDA under an Emergency Use Authorization (EUA). This EUA will remain  in effect (meaning this test can be used) for the duration of the COVID-19 declaration under Section 564(b)(1) of the Act, 21 U.S.C.section 360bbb-3(b)(1), unless the authorization is terminated  or revoked sooner.       Influenza A by PCR NEGATIVE NEGATIVE Final   Influenza B by PCR NEGATIVE NEGATIVE Final    Comment: (NOTE) The Xpert Xpress SARS-CoV-2/FLU/RSV plus assay is intended as an aid in the diagnosis of influenza from Nasopharyngeal swab specimens and should not be used as a sole basis for treatment. Nasal washings and aspirates are unacceptable for Xpert Xpress SARS-CoV-2/FLU/RSV testing.  Fact Sheet for Patients: EntrepreneurPulse.com.au  Fact Sheet for Healthcare Providers: IncredibleEmployment.be  This test is not yet approved or cleared by the [place] FDA and has been authorized for detection and/or diagnosis of SARS-CoV-2 by FDA under an Emergency Use Authorization (EUA). This EUA will remain in effect (meaning this test can be used) for the duration of the COVID-19 declaration under Section 564(b)(1) of the Act, 21 U.S.C. section 360bbb-3(b)(1), unless the authorization is terminated or revoked.  Performed at [facility], [address] [place], [place] [address]     Procedures and diagnostic studies:  No results found.             LOS: 1 day   Jerris Keltz  [facility]   Pager on [email or web address]. If 7PM-7AM, please contact night-coverage at [email or web address]     09/12/2022, 4:35 PM

## 2022-09-13 ENCOUNTER — Other Ambulatory Visit: Payer: Self-pay | Admitting: Family Medicine

## 2022-09-13 DIAGNOSIS — R112 Nausea with vomiting, unspecified: Secondary | ICD-10-CM

## 2022-09-13 DIAGNOSIS — E876 Hypokalemia: Secondary | ICD-10-CM | POA: Diagnosis not present

## 2022-09-13 DIAGNOSIS — J9611 Chronic respiratory failure with hypoxia: Secondary | ICD-10-CM | POA: Diagnosis not present

## 2022-09-13 DIAGNOSIS — G20 Parkinson's disease: Secondary | ICD-10-CM | POA: Diagnosis not present

## 2022-09-13 MED ORDER — MORPHINE SULFATE (PF) 2 MG/ML IV SOLN
2.0000 mg | INTRAVENOUS | Status: DC | PRN
  Administered 2022-09-14: 2 mg via INTRAVENOUS
  Filled 2022-09-13: qty 1

## 2022-09-13 MED ORDER — POTASSIUM CHLORIDE CRYS ER 20 MEQ PO TBCR
40.0000 meq | EXTENDED_RELEASE_TABLET | Freq: Once | ORAL | Status: AC
  Administered 2022-09-13: 40 meq via ORAL
  Filled 2022-09-13: qty 2

## 2022-09-13 MED ORDER — OXYCODONE HCL 5 MG PO TABS
5.0000 mg | ORAL_TABLET | Freq: Four times a day (QID) | ORAL | Status: DC | PRN
  Administered 2022-09-13 – 2022-09-14 (×3): 5 mg via ORAL
  Filled 2022-09-13 (×3): qty 1

## 2022-09-13 NOTE — Progress Notes (Signed)
Progress Note    [name]  WYO:[id number] DOB: November 30, 1951  DOA: 09/09/2022 PCP: [name], MD      Brief Narrative:    Medical records reviewed and are as summarized below:  Morgan [name] is a 71 y.o. female with Parkinson's disease, COPD, chronic diastolic CHF, chronic hypoxic respiratory failure on 3 to 4 L/min oxygen at night, depression, anxiety, GERD, complex migraines with aura, chronic anemia, who presents emergency department for chief concerns of abdominal pain, diarrhea and intractable nausea vomiting.     Assessment/Plan:   Principal Problem:   Intractable nausea and vomiting Active Problems:   Hypokalemia   Parkinson disease (HCC)   GERD (gastroesophageal reflux disease)   Major depressive disorder, recurrent (HCC)   Anxiety   HLD (hyperlipidemia)   Chronic respiratory failure with hypoxia (HCC)   IDA (iron deficiency anemia)   Migraine headache   Generalized anxiety disorder   Unintentional weight loss    Body mass index is 24.14 kg/m.  Intractable nausea, vomiting, abdominal pain and diarrhea, likely from gastroenteritis: Diarrhea has resolved.  She still has nausea, vomiting and abdominal pain.  Continue antiemetics as needed for nausea/vomiting.  Analgesics as needed for pain.  She requested IV morphine for pain.  Advance diet as tolerated.  Ambulate with assistance   Hypokalemia: Improved.  Continue potassium repletion.  Parkinson's disease: Continue carbidopa-levodopa  COPD, chronic hypoxic respiratory failure: Continue bronchodilators as needed.  She uses 3 to 4 L/min oxygen at night at baseline.  Chronic diastolic CHF: Compensated.  Unintentional weight loss: Follow-up with PCP as an outpatient for additional work-up  Other comorbidities include generalized anxiety disorder, migraine headaches, iron deficiency anemia   Diet Order             Diet regular Room service appropriate? Yes; Fluid consistency: Thin  Diet  effective now                            Consultants: None  Procedures: None    Medications:    carbidopa-levodopa  2 tablet Oral TID   dicyclomine  20 mg Oral TID AC & HS   enoxaparin (LOVENOX) injection  40 mg Subcutaneous Q24H   escitalopram  5 mg Oral Daily   famotidine  20 mg Oral BID   potassium chloride  40 mEq Oral Once   topiramate  25 mg Oral QHS   Continuous Infusions:  promethazine (PHENERGAN) injection (IM or IVPB) 12.5 mg (09/13/22 0350)     Anti-infectives (From admission, onward)    None              Family Communication/Anticipated D/C date and plan/Code Status   DVT prophylaxis: enoxaparin (LOVENOX) injection 40 mg Start: 09/09/22 2200 Place TED hose Start: 09/09/22 1457     Code Status: Full Code  Family Communication: None Disposition Plan: To discharge home tomorrow   Status is: Inpatient Remains inpatient appropriate because: Abdominal pain and vomiting, advance diet        Subjective:   She complains of nausea and vomiting.  She said she vomited once this morning after meals.  No diarrhea.  She thinks she may have used too much Imodium prior to admission.  Objective:    Vitals:   09/11/22 2326 09/12/22 0921 09/12/22 2357 09/13/22 0831  BP: 109/62 137/69 110/62 (!) 99/54  Pulse: 64 68 65 (!) 56  Resp: '16 18 18   '$ Temp: 97.9  F (36.6 C) 98.1 F (36.7 C) 98.4 F (36.9 C) 97.8 F (36.6 C)  TempSrc:      SpO2: 100% 100% 100% 100%  Weight:      Height:       No data found.   Intake/Output Summary (Last 24 hours) at 09/13/2022 1524 Last data filed at 09/13/2022 1400 Gross per 24 hour  Intake 950 ml  Output --  Net 950 ml   Filed Weights   09/09/22 0945  Weight: 83 kg    Exam:  GEN: NAD SKIN: Warm and dry EYES: EOMI ENT: MMM CV: RRR PULM: CTA B ABD: soft, ND, mid abdominal tenderness without rebound tenderness or guarding, +BS CNS: AAO x 3, non focal EXT: No edema or  tenderness        Data Reviewed:   I have personally reviewed following labs and imaging studies:  Labs: Labs show the following:   Basic Metabolic Panel: Recent Labs  Lab 09/09/22 0950 09/10/22 0620 09/12/22 0522  NA 139 139 141  K 3.1* 3.4* 3.5  CL 109 108 109  CO2 21* 28 28  GLUCOSE 109* 90 94  BUN 13 11 6*  CREATININE 0.72 0.70 0.68  CALCIUM 8.8* 8.1* 8.1*  MG 1.8  --  1.7   GFR Estimated Creatinine Clearance: 79 mL/min (by C-G formula based on SCr of 0.68 mg/dL). Liver Function Tests: Recent Labs  Lab 09/09/22 0950  AST 14*  ALT 6  ALKPHOS 127*  BILITOT 0.8  PROT 6.2*  ALBUMIN 3.2*   Recent Labs  Lab 09/09/22 0950  LIPASE 23   No results for input(s): "AMMONIA" in the last 168 hours. Coagulation profile No results for input(s): "INR", "PROTIME" in the last 168 hours.  CBC: Recent Labs  Lab 09/09/22 0950 09/10/22 0620 09/12/22 0522  WBC 7.9 5.3 5.5  NEUTROABS  --   --  2.8  HGB 12.0 10.4* 9.1*  HCT 36.5 32.4* 28.8*  MCV 80.8 81.6 83.7  PLT 297 217 201   Cardiac Enzymes: No results for input(s): "CKTOTAL", "CKMB", "CKMBINDEX", "TROPONINI" in the last 168 hours. BNP (last 3 results) No results for input(s): "PROBNP" in the last 8760 hours. CBG: No results for input(s): "GLUCAP" in the last 168 hours. D-Dimer: No results for input(s): "DDIMER" in the last 72 hours. Hgb A1c: No results for input(s): "HGBA1C" in the last 72 hours. Lipid Profile: No results for input(s): "CHOL", "HDL", "LDLCALC", "TRIG", "CHOLHDL", "LDLDIRECT" in the last 72 hours. Thyroid function studies: No results for input(s): "TSH", "T4TOTAL", "T3FREE", "THYROIDAB" in the last 72 hours.  Invalid input(s): "FREET3" Anemia work up: No results for input(s): "VITAMINB12", "FOLATE", "FERRITIN", "TIBC", "IRON", "RETICCTPCT" in the last 72 hours. Sepsis Labs: Recent Labs  Lab 09/09/22 0950 09/09/22 1129 09/10/22 0620 09/12/22 0522  WBC 7.9  --  5.3 5.5   LATICACIDVEN  --  1.8  --   --     Microbiology Recent Results (from the past 240 hour(s))  Giardia antigen     Status: None   Collection Time: 09/07/22  3:18 PM   Specimen: Stool  Result Value Ref Range Status   MICRO NUMBER: [id number]  Final   SPECIMEN QUALITY: Adequate  Final   Source: STOOL  Final   STATUS: FINAL  Final   RESULT: Not Detected  Final    Comment: Reference Range:Not Detected   COMMENT:   Final    NOTE: Due to intermittent shedding, one negative sample does not necessarily rule out  the presence of a parasitic infection.  Resp Panel by RT-PCR (Flu A&B, Covid) Anterior Nasal Swab     Status: None   Collection Time: 09/09/22  5:40 PM   Specimen: Anterior Nasal Swab  Result Value Ref Range Status   SARS Coronavirus 2 by RT PCR NEGATIVE NEGATIVE Final    Comment: (NOTE) SARS-CoV-2 target nucleic acids are NOT DETECTED.  The SARS-CoV-2 RNA is generally detectable in upper respiratory specimens during the acute phase of infection. The lowest concentration of SARS-CoV-2 viral copies this assay can detect is 138 copies/mL. A negative result does not preclude SARS-Cov-2 infection and should not be used as the sole basis for treatment or other patient management decisions. A negative result may occur with  improper specimen collection/handling, submission of specimen other than nasopharyngeal swab, presence of viral mutation(s) within the areas targeted by this assay, and inadequate number of viral copies(<138 copies/mL). A negative result must be combined with clinical observations, patient history, and epidemiological information. The expected result is Negative.  Fact Sheet for Patients:  EntrepreneurPulse.com.au  Fact Sheet for Healthcare Providers:  IncredibleEmployment.be  This test is no t yet approved or cleared by the [place] FDA and  has been authorized for detection and/or diagnosis of SARS-CoV-2 by FDA under an  Emergency Use Authorization (EUA). This EUA will remain  in effect (meaning this test can be used) for the duration of the COVID-19 declaration under Section 564(b)(1) of the Act, 21 U.S.C.section 360bbb-3(b)(1), unless the authorization is terminated  or revoked sooner.       Influenza A by PCR NEGATIVE NEGATIVE Final   Influenza B by PCR NEGATIVE NEGATIVE Final    Comment: (NOTE) The Xpert Xpress SARS-CoV-2/FLU/RSV plus assay is intended as an aid in the diagnosis of influenza from Nasopharyngeal swab specimens and should not be used as a sole basis for treatment. Nasal washings and aspirates are unacceptable for Xpert Xpress SARS-CoV-2/FLU/RSV testing.  Fact Sheet for Patients: EntrepreneurPulse.com.au  Fact Sheet for Healthcare Providers: IncredibleEmployment.be  This test is not yet approved or cleared by the [place] FDA and has been authorized for detection and/or diagnosis of SARS-CoV-2 by FDA under an Emergency Use Authorization (EUA). This EUA will remain in effect (meaning this test can be used) for the duration of the COVID-19 declaration under Section 564(b)(1) of the Act, 21 U.S.C. section 360bbb-3(b)(1), unless the authorization is terminated or revoked.  Performed at [facility], [address] [place], [place] [address]     Procedures and diagnostic studies:  No results found.             LOS: 2 days   Onesty Clair  [facility]   Pager on [email or web address]. If 7PM-7AM, please contact night-coverage at [email or web address]     09/13/2022, 3:24 PM

## 2022-09-14 ENCOUNTER — Other Ambulatory Visit: Payer: Self-pay | Admitting: Family Medicine

## 2022-09-14 DIAGNOSIS — F331 Major depressive disorder, recurrent, moderate: Secondary | ICD-10-CM

## 2022-09-14 DIAGNOSIS — E782 Mixed hyperlipidemia: Secondary | ICD-10-CM

## 2022-09-14 DIAGNOSIS — Z8673 Personal history of transient ischemic attack (TIA), and cerebral infarction without residual deficits: Secondary | ICD-10-CM

## 2022-09-14 DIAGNOSIS — G20 Parkinson's disease: Secondary | ICD-10-CM | POA: Diagnosis not present

## 2022-09-14 DIAGNOSIS — R112 Nausea with vomiting, unspecified: Secondary | ICD-10-CM

## 2022-09-14 DIAGNOSIS — J9611 Chronic respiratory failure with hypoxia: Secondary | ICD-10-CM | POA: Diagnosis not present

## 2022-09-14 DIAGNOSIS — F411 Generalized anxiety disorder: Secondary | ICD-10-CM

## 2022-09-14 LAB — BASIC METABOLIC PANEL
Anion gap: 1 — ABNORMAL LOW (ref 5–15)
BUN: 6 mg/dL — ABNORMAL LOW (ref 8–23)
CO2: 30 mmol/L (ref 22–32)
Calcium: 8.4 mg/dL — ABNORMAL LOW (ref 8.9–10.3)
Chloride: 108 mmol/L (ref 98–111)
Creatinine, Ser: 0.77 mg/dL (ref 0.44–1.00)
GFR, Estimated: >60 mL/min (ref >60)
Glucose, Bld: 85 mg/dL (ref 70–99)
Potassium: 3.5 mmol/L (ref 3.5–5.1)
Sodium: 139 mmol/L (ref 135–145)

## 2022-09-14 LAB — CBC
HCT: 27 % — ABNORMAL LOW (ref 36.0–46.0)
Hemoglobin: 8.4 g/dL — ABNORMAL LOW (ref 12.0–15.0)
MCH: 26.2 pg (ref 26.0–34.0)
MCHC: 31.1 g/dL (ref 30.0–36.0)
MCV: 84.1 fL (ref 80.0–100.0)
Platelets: 188 10*3/uL (ref 150–400)
RBC: 3.21 MIL/uL — ABNORMAL LOW (ref 3.87–5.11)
RDW: 17.2 % — ABNORMAL HIGH (ref 11.5–15.5)
WBC: 5.2 10*3/uL (ref 4.0–10.5)
nRBC: 0 % (ref 0.0–0.2)

## 2022-09-14 MED ORDER — OXYCODONE HCL 5 MG PO TABS: 5.0000 mg | ORAL_TABLET | Freq: Three times a day (TID) | ORAL | 0 refills | Status: DC | PRN

## 2022-09-14 NOTE — Plan of Care (Signed)

## 2022-09-14 NOTE — Progress Notes (Signed)
0953 D/c paperwork reviewed with pt. All questions and concerns answered. IV removed. Pt waiting for ride.

## 2022-09-14 NOTE — Discharge Summary (Signed)
Physician Discharge Summary   Patient: Morgan [name] MRN: [id number] DOB: 08-07-1951  Admit date:     09/09/2022  Discharge date: 09/14/2022  Discharge Physician: Jennye Boroughs   PCP: [name], MD   Recommendations at discharge:    Follow up with PCP in 1 week  Discharge Diagnoses: Principal Problem:   Intractable nausea and vomiting Active Problems:   Hypokalemia   Parkinson disease (HCC)   GERD (gastroesophageal reflux disease)   Major depressive disorder, recurrent (HCC)   Anxiety   HLD (hyperlipidemia)   Chronic respiratory failure with hypoxia (HCC)   IDA (iron deficiency anemia)   Migraine headache   Generalized anxiety disorder   Unintentional weight loss  Resolved Problems:   * No resolved hospital problems. [facility] Course: Morgan [name] is a 71 year old female with chronic diastolic CHF, COPD, chronic hypoxic respiratory failure on 3 to 4 L/min oxygen via nasal cannula, Parkinson's disease, depression, anxiety, GERD, complex migraines with aura, chronic anemia, who presents emergency department for chief concerns of abdominal pain, diarrhea and intractable nausea vomiting.  Her symptoms were attributed to gastroenteritis.  She was treated with IV fluids, antiemetics and analgesics.  She also had hypokalemia that was successfully repleted.  Her condition has improved and she is deemed stable for discharge to home today.     Pain control - [employer] Controlled Substance Reporting System database was reviewed. and patient was instructed, not to drive, operate heavy machinery, perform activities at heights, swimming or participation in water activities or provide baby-sitting services while on Pain, Sleep and Anxiety Medications; until their outpatient Physician has advised to do so again. Also recommended to not to take more than prescribed Pain, Sleep and Anxiety Medications.  Consultants: None Procedures performed: None  Disposition: Home Diet  recommendation:  Discharge Diet Orders (From admission, onward)     Start     Ordered   09/14/22 0000  Diet - low sodium heart healthy        09/14/22 0921           Cardiac diet DISCHARGE MEDICATION: Allergies as of 09/14/2022       Reactions   Meperidine Hives, Anaphylaxis   Strawberry Extract Hives, Swelling   Sucralfate Nausea Only, Other (See Comments)   Severe nausea Other reaction(s): Other (See Comments) Severe nausea Severe nausea   Wasp Venom Hives        Medication List     TAKE these medications    acetaminophen 325 MG tablet Commonly known as: TYLENOL Take 2 tablets (650 mg total) by mouth every 4 (four) hours as needed for mild pain (or temp > 37.5 C (99.5 F)).   carbidopa-levodopa 25-100 MG tablet Commonly known as: SINEMET IR Take 2 tablets by mouth 3 (three) times daily.   escitalopram 5 MG tablet Commonly known as: Lexapro Take 1 tablet (5 mg total) by mouth daily.   hydrOXYzine 25 MG tablet Commonly known as: ATARAX Take 1 tablet (25 mg total) by mouth 2 (two) times daily as needed for anxiety or nausea.   ibuprofen 400 MG tablet Commonly known as: ADVIL Take 1 tablet (400 mg total) by mouth every 8 (eight) hours as needed for headache or moderate pain.   oxyCODONE 5 MG immediate release tablet Commonly known as: Roxicodone Take 1 tablet (5 mg total) by mouth every 8 (eight) hours as needed for moderate pain.   OXYGEN Inhale 3 L/min into the lungs See admin instructions. 3 L/min at  bedtime and as needed for shortness of breath throughout the day   promethazine 12.5 MG tablet Commonly known as: PHENERGAN Take 1 tablet (12.5 mg total) by mouth every 12 (twelve) hours as needed for nausea or vomiting.   topiramate 50 MG tablet Commonly known as: TOPAMAX Take 50 mg by mouth at bedtime.        Discharge Exam: Filed Weights   09/09/22 0945  Weight: 83 kg   GEN: NAD SKIN: Warm and dry EYES: No pallor or icterus ENT: MMM CV:  RRR PULM: CTA B ABD: soft, ND, NT, +BS CNS: AAO x 3, non focal EXT: No edema or tenderness   Condition at discharge: good  The results of significant diagnostics from this hospitalization (including imaging, microbiology, ancillary and laboratory) are listed below for reference.   Imaging Studies: CT ABDOMEN PELVIS W CONTRAST  Result Date: 09/09/2022 CLINICAL DATA:  71 year old female with left lower quadrant pain. Recent CT Abdomen and Pelvis negative for diverticulitis. EXAM: CT ABDOMEN AND PELVIS WITH CONTRAST TECHNIQUE: Multidetector CT imaging of the abdomen and pelvis was performed using the standard protocol following bolus administration of intravenous contrast. RADIATION DOSE REDUCTION: This exam was performed according to the departmental dose-optimization program which includes automated exposure control, adjustment of the mA and/or kV according to patient size and/or use of iterative reconstruction technique. CONTRAST:  135m OMNIPAQUE IOHEXOL 300 MG/ML  SOLN COMPARISON:  CT Abdomen and Pelvis 09/06/2022 and earlier. FINDINGS: Lower chest: No pericardial or pleural effusion. Negative lung bases. Calcified aortic atherosclerosis. Hepatobiliary: Absent gallbladder.  Stable liver and biliary tree. Pancreas: Atrophied. Spleen: Negative. Adrenals/Urinary Tract: Stable, negative. Symmetric renal enhancement and contrast excretion. Unremarkable bladder. Stomach/Bowel: Stable distal large bowel. Diverticulosis throughout the sigmoid colon. No active inflammation identified. Mostly decompressed upstream descending colon. Retained stool in the transverse and right colon. Diminutive or absent appendix. No dilated small bowel. Decompressed stomach and duodenum. No free air or free fluid. No mesenteric inflammation identified. Vascular/Lymphatic: Aortoiliac calcified atherosclerosis. Normal caliber abdominal aorta. Major arterial structures in the abdomen and pelvis remain patent. Portal venous system  is patent. No lymphadenopathy identified. Reproductive: Absent uterus.  Diminutive or absent ovaries. Other: No pelvic free fluid.  Chronic pelvic phleboliths. Musculoskeletal: Previously augmented lower thoracic vertebrae. Underlying osteopenia. No acute osseous abnormality identified. IMPRESSION: 1. No acute or inflammatory process identified in the abdomen or pelvis. 2. Chronic sigmoid diverticulosis, no active inflammation. 3. Aortic Atherosclerosis (ICD10-I70.0). Electronically Signed   By: HGenevie AnnM.D.   On: 09/09/2022 12:54   CT ABDOMEN PELVIS W CONTRAST  Result Date: 09/06/2022 CLINICAL DATA:  Abdominal pain, acute, nonlocalized hx of diverticulitis EXAM: CT ABDOMEN AND PELVIS WITH CONTRAST TECHNIQUE: Multidetector CT imaging of the abdomen and pelvis was performed using the standard protocol following bolus administration of intravenous contrast. RADIATION DOSE REDUCTION: This exam was performed according to the departmental dose-optimization program which includes automated exposure control, adjustment of the mA and/or kV according to patient size and/or use of iterative reconstruction technique. CONTRAST:  103mOMNIPAQUE IOHEXOL 300 MG/ML  SOLN COMPARISON:  CT abdomen pelvis 08/03/2022 FINDINGS: Lower chest: No acute abnormality. Hepatobiliary: No focal liver abnormality is seen. Prior cholecystectomy. Pancreas: Unremarkable. No pancreatic ductal dilatation or surrounding inflammatory changes. Spleen: Normal in size without focal abnormality. Adrenals/Urinary Tract: Adrenal glands are unremarkable. No hydronephrosis or nephrolithiasis. Moderate bladder distension. Stomach/Bowel: The stomach is within normal limits. There is no evidence of bowel obstruction.The appendix is surgically absent. Sigmoid diverticulosis. No acute diverticulitis. Vascular/Lymphatic: Aorto  bi-iliac atherosclerosis no AAA. No lymphadenopathy. Reproductive: Prior hysterectomy. Other: No bowel containing hernia.  No ascites.  No  free air. Musculoskeletal: Chronic compression deformities of T8 and T9 prior vertebroplasty. Mild multilevel degenerative changes of the spine. No acute osseous abnormality. IMPRESSION: No acute findings in the abdomen or pelvis. Sigmoid diverticulosis without evidence of acute diverticulitis. Prior cholecystectomy, appendectomy, and hysterectomy. Electronically Signed   By: Maurine Simmering M.D.   On: 09/06/2022 13:29   MM 3D SCREEN BREAST BILATERAL  Result Date: 09/03/2022 CLINICAL DATA:  Screening. EXAM: DIGITAL SCREENING BILATERAL MAMMOGRAM WITH TOMOSYNTHESIS AND CAD TECHNIQUE: Bilateral screening digital craniocaudal and mediolateral oblique mammograms were obtained. Bilateral screening digital breast tomosynthesis was performed. The images were evaluated with computer-aided detection. COMPARISON:  Previous exam(s). ACR Breast Density Category c: The breast tissue is heterogeneously dense, which may obscure small masses. FINDINGS: There are no findings suspicious for malignancy. IMPRESSION: No mammographic evidence of malignancy. A result letter of this screening mammogram will be mailed directly to the patient. RECOMMENDATION: Screening mammogram in one year. (Code:[id number]-B-01Y) BI-RADS CATEGORY  1: Negative. Electronically Signed   By: Lajean Manes M.D.   On: 09/03/2022 09:33    Microbiology: Results for orders placed or performed during the hospital encounter of 09/09/22  Resp Panel by RT-PCR (Flu A&B, Covid) Anterior Nasal Swab     Status: None   Collection Time: 09/09/22  5:40 PM   Specimen: Anterior Nasal Swab  Result Value Ref Range Status   SARS Coronavirus 2 by RT PCR NEGATIVE NEGATIVE Final    Comment: (NOTE) SARS-CoV-2 target nucleic acids are NOT DETECTED.  The SARS-CoV-2 RNA is generally detectable in upper respiratory specimens during the acute phase of infection. The lowest concentration of SARS-CoV-2 viral copies this assay can detect is 138 copies/mL. A negative result does not  preclude SARS-Cov-2 infection and should not be used as the sole basis for treatment or other patient management decisions. A negative result may occur with  improper specimen collection/handling, submission of specimen other than nasopharyngeal swab, presence of viral mutation(s) within the areas targeted by this assay, and inadequate number of viral copies(<138 copies/mL). A negative result must be combined with clinical observations, patient history, and epidemiological information. The expected result is Negative.  Fact Sheet for Patients:  EntrepreneurPulse.com.au  Fact Sheet for Healthcare Providers:  IncredibleEmployment.be  This test is no t yet approved or cleared by the [place] FDA and  has been authorized for detection and/or diagnosis of SARS-CoV-2 by FDA under an Emergency Use Authorization (EUA). This EUA will remain  in effect (meaning this test can be used) for the duration of the COVID-19 declaration under Section 564(b)(1) of the Act, 21 U.S.C.section 360bbb-3(b)(1), unless the authorization is terminated  or revoked sooner.       Influenza A by PCR NEGATIVE NEGATIVE Final   Influenza B by PCR NEGATIVE NEGATIVE Final    Comment: (NOTE) The Xpert Xpress SARS-CoV-2/FLU/RSV plus assay is intended as an aid in the diagnosis of influenza from Nasopharyngeal swab specimens and should not be used as a sole basis for treatment. Nasal washings and aspirates are unacceptable for Xpert Xpress SARS-CoV-2/FLU/RSV testing.  Fact Sheet for Patients: EntrepreneurPulse.com.au  Fact Sheet for Healthcare Providers: IncredibleEmployment.be  This test is not yet approved or cleared by the [place] FDA and has been authorized for detection and/or diagnosis of SARS-CoV-2 by FDA under an Emergency Use Authorization (EUA). This EUA will remain in effect (meaning this test can be used)  for the  duration of the COVID-19 declaration under Section 564(b)(1) of the Act, 21 U.S.C. section 360bbb-3(b)(1), unless the authorization is terminated or revoked.  Performed at [facility], [address] [place], [place] [address]     Labs: CBC: Recent Labs  Lab 09/09/22 [phone] 09/10/22 0620 09/12/22 0522 09/14/22 0700  WBC 7.9 5.3 5.5 5.2  NEUTROABS  --   --  2.8  --   HGB 12.0 10.4* 9.1* 8.4*  HCT 36.5 32.4* 28.8* 27.0*  MCV 80.8 81.6 83.7 84.1  PLT 297 217 201 734   Basic Metabolic Panel: Recent Labs  Lab 09/09/22 0950 09/10/22 0620 09/12/22 0522 09/14/22 0700  NA 139 139 141 139  K 3.1* 3.4* 3.5 3.5  CL 109 108 109 108  CO2 21* '28 28 30  '$ GLUCOSE 109* 90 94 85  BUN 13 11 6* 6*  CREATININE 0.72 0.70 0.68 0.77  CALCIUM 8.8* 8.1* 8.1* 8.4*  MG 1.8  --  1.7  --    Liver Function Tests: Recent Labs  Lab 09/09/22 0950  AST 14*  ALT 6  ALKPHOS 127*  BILITOT 0.8  PROT 6.2*  ALBUMIN 3.2*   CBG: No results for input(s): "GLUCAP" in the last 168 hours.  Discharge time spent: greater than 30 minutes.  Signed: Jennye Boroughs, MD [facility] 09/14/2022

## 2022-09-17 ENCOUNTER — Telehealth: Payer: Self-pay | Admitting: *Deleted

## 2022-09-17 NOTE — Patient Outreach (Signed)
  Care Coordination [facility] Note Transition Care Management Unsuccessful Follow-up Telephone Call  Date of discharge and from where:  [facility] [phone]  Attempts:  1st Attempt  Reason for unsuccessful TCM follow-up call:  Left voice message  [facility] [phone]

## 2022-09-18 ENCOUNTER — Emergency Department: Payer: Medicare HMO

## 2022-09-18 ENCOUNTER — Emergency Department
Admission: EM | Admit: 2022-09-18 | Discharge: 2022-09-19 | Disposition: A | Discharge status: Home / Self Care | Payer: Medicare HMO | Attending: Student in an Organized Health Care Education/Training Program | Admitting: Student in an Organized Health Care Education/Training Program

## 2022-09-18 ENCOUNTER — Encounter: Payer: Self-pay | Admitting: Medical Oncology

## 2022-09-18 ENCOUNTER — Telehealth: Payer: Self-pay | Admitting: *Deleted

## 2022-09-18 DIAGNOSIS — R1110 Vomiting, unspecified: Secondary | ICD-10-CM | POA: Diagnosis not present

## 2022-09-18 DIAGNOSIS — G20C Parkinsonism, unspecified: Secondary | ICD-10-CM | POA: Insufficient documentation

## 2022-09-18 DIAGNOSIS — R197 Diarrhea, unspecified: Secondary | ICD-10-CM | POA: Diagnosis not present

## 2022-09-18 DIAGNOSIS — R1084 Generalized abdominal pain: Secondary | ICD-10-CM | POA: Diagnosis not present

## 2022-09-18 DIAGNOSIS — R000 Tachycardia, unspecified: Secondary | ICD-10-CM | POA: Diagnosis not present

## 2022-09-18 DIAGNOSIS — R109 Unspecified abdominal pain: Secondary | ICD-10-CM | POA: Diagnosis present

## 2022-09-18 DIAGNOSIS — R112 Nausea with vomiting, unspecified: Secondary | ICD-10-CM | POA: Diagnosis not present

## 2022-09-18 LAB — COMPREHENSIVE METABOLIC PANEL
ALT: 6 U/L (ref 0–44)
AST: 20 U/L (ref 15–41)
Albumin: 3.3 g/dL — ABNORMAL LOW (ref 3.5–5.0)
Alkaline Phosphatase: 174 U/L — ABNORMAL HIGH (ref 38–126)
Anion gap: 12 (ref 5–15)
BUN: 11 mg/dL (ref 8–23)
CO2: 23 mmol/L (ref 22–32)
Calcium: 9 mg/dL (ref 8.9–10.3)
Chloride: 101 mmol/L (ref 98–111)
Creatinine, Ser: 1.02 mg/dL — ABNORMAL HIGH (ref 0.44–1.00)
GFR, Estimated: 60 mL/min — ABNORMAL LOW (ref >60)
Glucose, Bld: 150 mg/dL — ABNORMAL HIGH (ref 70–99)
Potassium: 2.9 mmol/L — ABNORMAL LOW (ref 3.5–5.1)
Sodium: 136 mmol/L (ref 135–145)
Total Bilirubin: 0.6 mg/dL (ref 0.3–1.2)
Total Protein: 6.8 g/dL (ref 6.5–8.1)

## 2022-09-18 LAB — LIPASE, BLOOD: Lipase: 21 U/L (ref 11–51)

## 2022-09-18 LAB — MAGNESIUM: Magnesium: 1.8 mg/dL (ref 1.7–2.4)

## 2022-09-18 LAB — CBC
HCT: 38.5 % (ref 36.0–46.0)
Hemoglobin: 12.3 g/dL (ref 12.0–15.0)
MCH: 26 pg (ref 26.0–34.0)
MCHC: 31.9 g/dL (ref 30.0–36.0)
MCV: 81.4 fL (ref 80.0–100.0)
Platelets: 357 10*3/uL (ref 150–400)
RBC: 4.73 MIL/uL (ref 3.87–5.11)
RDW: 16 % — ABNORMAL HIGH (ref 11.5–15.5)
WBC: 8.8 10*3/uL (ref 4.0–10.5)
nRBC: 0 % (ref 0.0–0.2)

## 2022-09-18 LAB — URINALYSIS, ROUTINE W REFLEX MICROSCOPIC
Bacteria, UA: NONE SEEN
Bilirubin Urine: NEGATIVE
Glucose, UA: NEGATIVE mg/dL
Hgb urine dipstick: NEGATIVE
Ketones, ur: NEGATIVE mg/dL
Nitrite: NEGATIVE
Protein, ur: NEGATIVE mg/dL
Specific Gravity, Urine: 1.027 (ref 1.005–1.030)
pH: 8 (ref 5.0–8.0)

## 2022-09-18 LAB — TROPONIN I (HIGH SENSITIVITY): Troponin I (High Sensitivity): 7 ng/L (ref <18)

## 2022-09-18 LAB — LACTIC ACID, PLASMA: Lactic Acid, Venous: 2.3 mmol/L (ref 0.5–1.9)

## 2022-09-18 MED ORDER — LACTATED RINGERS IV BOLUS
1000.0000 mL | Freq: Once | INTRAVENOUS | Status: AC
  Administered 2022-09-18: 1000 mL via INTRAVENOUS

## 2022-09-18 MED ORDER — MORPHINE SULFATE (PF) 4 MG/ML IV SOLN
4.0000 mg | INTRAVENOUS | Status: DC | PRN
  Administered 2022-09-18: 4 mg via INTRAVENOUS
  Filled 2022-09-18: qty 1

## 2022-09-18 MED ORDER — ONDANSETRON HCL 4 MG/2ML IJ SOLN
4.0000 mg | Freq: Once | INTRAMUSCULAR | Status: AC | PRN
  Administered 2022-09-18: 4 mg via INTRAVENOUS
  Filled 2022-09-18: qty 2

## 2022-09-18 MED ORDER — LORAZEPAM 2 MG/ML IJ SOLN
1.0000 mg | Freq: Once | INTRAMUSCULAR | Status: AC
  Administered 2022-09-18: 1 mg via INTRAVENOUS
  Filled 2022-09-18: qty 1

## 2022-09-18 MED ORDER — PANTOPRAZOLE SODIUM 40 MG IV SOLR
40.0000 mg | Freq: Once | INTRAVENOUS | Status: AC
  Administered 2022-09-18: 40 mg via INTRAVENOUS
  Filled 2022-09-18: qty 10

## 2022-09-18 MED ORDER — POTASSIUM CHLORIDE 10 MEQ/100ML IV SOLN
10.0000 meq | Freq: Once | INTRAVENOUS | Status: AC
  Administered 2022-09-18: 10 meq via INTRAVENOUS
  Filled 2022-09-18: qty 100

## 2022-09-18 MED ORDER — ALUM & MAG HYDROXIDE-SIMETH 200-200-20 MG/5ML PO SUSP
30.0000 mL | Freq: Once | ORAL | Status: AC
  Administered 2022-09-18: 30 mL via ORAL
  Filled 2022-09-18: qty 30

## 2022-09-18 MED ORDER — IOHEXOL 350 MG/ML SOLN
100.0000 mL | Freq: Once | INTRAVENOUS | Status: AC | PRN
  Administered 2022-09-18: 100 mL via INTRAVENOUS

## 2022-09-18 MED ORDER — MAGNESIUM SULFATE 2 GM/50ML IV SOLN
2.0000 g | INTRAVENOUS | Status: AC
  Administered 2022-09-18: 2 g via INTRAVENOUS
  Filled 2022-09-18: qty 50

## 2022-09-18 MED ORDER — HALOPERIDOL LACTATE 5 MG/ML IJ SOLN
2.0000 mg | Freq: Once | INTRAMUSCULAR | Status: AC
  Administered 2022-09-18: 2 mg via INTRAVENOUS
  Filled 2022-09-18: qty 1

## 2022-09-18 NOTE — ED Provider Notes (Incomplete)
   [facility] Provider Note    Event Date/Time   First MD Initiated Contact with Patient 09/18/22 1420     (approximate)   History   Vomiting and Diarrhea   HPI  Morgan [name] is a 71 y.o. female  ***       Physical Exam   Triage Vital Signs: ED Triage Vitals [09/18/22 1312]  Enc Vitals Group     BP (!) 161/94     Pulse Rate 95     Resp 20     Temp 98.1 F (36.7 C)     Temp Source Oral     SpO2 98 %     Weight 182 lb 15.7 oz (83 kg)     Height '6\' 1"'$  (1.854 m)     Head Circumference      Peak Flow      Pain Score 8     Pain Loc      Pain Edu?      Excl. in [place]?     Most recent vital signs: Vitals:   09/18/22 1312  BP: (!) 161/94  Pulse: 95  Resp: 20  Temp: 98.1 F (36.7 C)  SpO2: 98%     Constitutional: Alert *** Eyes: Conjunctivae are normal.  Head: Atraumatic. Nose: No congestion/rhinnorhea. Mouth/Throat: Mucous membranes are moist.   Neck: Painless ROM.  Cardiovascular:   Good peripheral circulation. Respiratory: Normal respiratory effort.  No retractions.  Gastrointestinal: Soft and nontender.  Musculoskeletal:  no deformity Neurologic:  MAE spontaneously. No gross focal neurologic deficits are appreciated. *** Skin:  Skin is warm, dry and intact. No rash noted. Psychiatric: Mood and affect are normal. Speech and behavior are normal.    ED Results / Procedures / Treatments   Labs (all labs ordered are listed, but only abnormal results are displayed) Labs Reviewed  COMPREHENSIVE METABOLIC PANEL - Abnormal; Notable for the following components:      Result Value   Potassium 2.9 (*)    Glucose, Bld 150 (*)    Creatinine, Ser 1.02 (*)    Albumin 3.3 (*)    Alkaline Phosphatase 174 (*)    GFR, Estimated 60 (*)    All other components within normal limits  CBC - Abnormal; Notable for the following components:   RDW 16.0 (*)    All other components within normal limits  LIPASE, BLOOD  URINALYSIS, ROUTINE W  REFLEX MICROSCOPIC     EKG  ***   RADIOLOGY ***   PROCEDURES:  Critical Care performed: {CriticalCareYesNo:19197::"Yes, see critical care procedure note(s)","No"}  Procedures   MEDICATIONS ORDERED IN ED: Medications  ondansetron (ZOFRAN) injection 4 mg (4 mg Intravenous Given 09/18/22 1321)     IMPRESSION / MDM / ASSESSMENT AND PLAN / ED COURSE  I reviewed the triage vital signs and the nursing notes.                              Differential diagnosis includes, but is not limited to, ***      Patient's presentation is most consistent with {EM COPA:27473}   FINAL CLINICAL IMPRESSION(S) / ED DIAGNOSES   Final diagnoses:  None     Rx / DC Orders   ED Discharge Orders     None        Note:  This document was prepared using Dragon voice recognition software and may include unintentional dictation errors.

## 2022-09-18 NOTE — ED Notes (Signed)
Patient reports her symptoms have improved

## 2022-09-18 NOTE — ED Provider Notes (Signed)
Procedures ----------------------------------------- 7:17 PM on 09/18/2022 ----------------------------------------- CT angiogram is unremarkable, no acute findings.  Patient still complains of severe generalized abdominal pain, nausea, clutching a emesis bag.  Reports that she is not feeling any better and would not be able to attempt oral intake.  She does appear very anxious.  I will give her low doses of IV Ativan for anxiolysis, Haldol for antiemetic effect and reassess.  ----------------------------------------- 10:38 PM on 09/18/2022 ----------------------------------------- Patient feeling much better.  She was able to sleep.  She feels ready to try oral intake and request crackers and grape juice.  If tolerated, she will be stable for discharge.          Carrie Mew, MD 09/18/22 2238

## 2022-09-18 NOTE — ED Provider Notes (Signed)
[facility] Provider Note    Event Date/Time   First MD Initiated Contact with Patient 09/18/22 1420     (approximate)   History   Vomiting and Diarrhea   HPI  Morgan [name] is a 71 y.o. female with a history of Parkinson disease presents to the ER for evaluation of abdominal pain nausea vomiting episodes of nonbloody nonmelanotic diarrhea.  Was admitted for same little more than a week ago.  Of source of symptoms was not identified.  She feels like her pain is getting worse.  Having trouble keeping food down.  Denies any chest pain or shortness of breath.  Describes the pain is moderate to severe.     Physical Exam   Triage Vital Signs: ED Triage Vitals [09/18/22 1312]  Enc Vitals Group     BP (!) 161/94     Pulse Rate 95     Resp 20     Temp 98.1 F (36.7 C)     Temp Source Oral     SpO2 98 %     Weight 182 lb 15.7 oz (83 kg)     Height '6\' 1"'$  (1.854 m)     Head Circumference      Peak Flow      Pain Score 8     Pain Loc      Pain Edu?      Excl. in [place]?     Most recent vital signs: Vitals:   09/18/22 1312  BP: (!) 161/94  Pulse: 95  Resp: 20  Temp: 98.1 F (36.7 C)  SpO2: 98%     Constitutional: Alert  Eyes: Conjunctivae are normal.  Head: Atraumatic. Nose: No congestion/rhinnorhea. Mouth/Throat: Mucous membranes are moist.   Neck: Painless ROM.  Cardiovascular:   Good peripheral circulation. Respiratory: Normal respiratory effort.  No retractions.  Gastrointestinal: Soft with mild generalized tenderness to palpation. Musculoskeletal:  no deformity Neurologic:  MAE spontaneously. No gross focal neurologic deficits are appreciated.  Skin:  Skin is warm, dry and intact. No rash noted. Psychiatric: Mood and affect are normal. Speech and behavior are normal.    ED Results / Procedures / Treatments   Labs (all labs ordered are listed, but only abnormal results are displayed) Labs Reviewed  COMPREHENSIVE METABOLIC PANEL -  Abnormal; Notable for the following components:      Result Value   Potassium 2.9 (*)    Glucose, Bld 150 (*)    Creatinine, Ser 1.02 (*)    Albumin 3.3 (*)    Alkaline Phosphatase 174 (*)    GFR, Estimated 60 (*)    All other components within normal limits  CBC - Abnormal; Notable for the following components:   RDW 16.0 (*)    All other components within normal limits  LIPASE, BLOOD  URINALYSIS, ROUTINE W REFLEX MICROSCOPIC  LACTIC ACID, PLASMA  LACTIC ACID, PLASMA  TROPONIN I (HIGH SENSITIVITY)     EKG ED ECG REPORT I, Merlyn Lot, the attending physician, personally viewed and interpreted this ECG.   Date: 09/18/2022  EKG Time: 13:23  Rate: 110  Rhythm: sinus  Axis: normal  Intervals:normal intervals  ST&T Change: no stemi, no depressions     RADIOLOGY Please see ED Course for my review and interpretation.  I personally reviewed all radiographic images ordered to evaluate for the above acute complaints and reviewed radiology reports and findings.  These findings were personally discussed with the patient.  Please see medical record for radiology  report.    PROCEDURES:  Critical Care performed: No  Procedures   MEDICATIONS ORDERED IN ED: Medications  lactated ringers bolus 1,000 mL (has no administration in time range)  potassium chloride 10 mEq in 100 mL IVPB (has no administration in time range)  morphine (PF) 4 MG/ML injection 4 mg (has no administration in time range)  ondansetron (ZOFRAN) injection 4 mg (4 mg Intravenous Given 09/18/22 1321)  iohexol (OMNIPAQUE) 350 MG/ML injection 100 mL (100 mLs Intravenous Contrast Given 09/18/22 1459)     IMPRESSION / MDM / ASSESSMENT AND PLAN / ED COURSE  I reviewed the triage vital signs and the nursing notes.                              Differential diagnosis includes, but is not limited to, enteritis, gastritis, perforation, SBO, gastroparesis, mesenteric ischemia, medication effect, UTI, biliary  pathology, abscess  Patient presenting to the ER for evaluation of symptoms as described above.  Base on symptoms, risk factors and considered above differential, this presenting complaint could reflect a potentially life-threatening illness therefore the patient will be placed on continuous pulse oximetry and telemetry for monitoring.  Laboratory evaluation will be sent to evaluate for the above complaints.  CT imaging will be ordered for the but differential will give IV fluids.  She is hypokalemic will give IV potassium repletion.  Will order IV morphine for pain.  Patient be signed out to oncoming physician pending follow-up CT imaging and reassessment.     FINAL CLINICAL IMPRESSION(S) / ED DIAGNOSES   Final diagnoses:  Nausea vomiting and diarrhea  Generalized abdominal pain     Rx / DC Orders   ED Discharge Orders     None        Note:  This document was prepared using Dragon voice recognition software and may include unintentional dictation errors.    Merlyn Lot, MD 09/18/22 1510

## 2022-09-18 NOTE — ED Notes (Signed)
Report off to tiffany rn

## 2022-09-18 NOTE — ED Triage Notes (Signed)
Pt reports that she has been having vomiting and diarrhea for a couple of days, was just discharged from hospital for same last week. Pt dry heaving in triage. Reports generalized abd pain.

## 2022-09-18 NOTE — Patient Outreach (Signed)
  Care Coordination [facility] Note Transition Care Management Unsuccessful Follow-up Telephone Call  Date of discharge and from where:  [facility] [phone]  Attempts:  2nd Attempt  Reason for unsuccessful TCM follow-up call:  Left voice message  [place] [facility] [phone]

## 2022-09-18 NOTE — ED Notes (Signed)
Pt reports abd pain for 2 days.  Pt has n/v/d.  Pt recently in hospital for similar sx.  Pt states not much better.  Iv fluids and meds infusing.  Pt alert  speech clear.

## 2022-09-19 ENCOUNTER — Telehealth: Payer: Self-pay | Admitting: *Deleted

## 2022-09-19 NOTE — Patient Outreach (Signed)
  Care Coordination [facility] Note Transition Care Management Unsuccessful Follow-up Telephone Call  Date of discharge and from where:  [facility] [phone]  Attempts:  3rd Attempt  Reason for unsuccessful TCM follow-up call:  Left voice message  [place] [facility] [phone]

## 2022-09-20 NOTE — Progress Notes (Signed)
Established Patient Office Visit  Subjective:  Patient ID: Morgan [name], female    DOB: [birth year]  Age: 71 y.o. MRN: [id number]  CC:  Chief Complaint  Patient presents with   Transitions Of Care    HPI Morgan [name] is here for a transition of care visit.  Prior provider was: Pt is without acute concerns.   Was just seen in ER 10/3 for N/V and diarrhea with some abd pain.  10/3 CTA , no acute findings. Given low dose IV ativan  Ekg in er NSR  Reviewed CT abd pelvis 10/3 with findings of high grade stenosis and near occlusion of left renal artery 7 mm, post tenotic dilatation in left renal artery/post stenotic dilatation. Lower chest with left lower lobe granuloma 4 mm. Diverticula no diverticulitis. Low density structure transverse colon could be suspected calcified lipoma.  Also with aortic arthrosclerosis.   Does report nausea with vomiting and diarrhea over the last 2-3 months. She has noticed she is losing weight as well.  Filed Weights   09/21/22 1046  Weight: 176 lb 4 oz (79.9 kg)  Was 235 pounds 09/06/21.    She also c/o constant pain in her lower abdomen with constant abdominal pressure, feels like an abd contraction. Comes to a peak and hurts really bad when this occurs. Has started to progress upwards. Lack of energy to do anything, has to lie in bed all of the time. She tries to eat and it comes right back up or she has diarrhea.   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   Colonoscopy 11/03/21 , 12 mm polpy  These symptoms have been pretty moderate over the last few months, more severe over the last week. Everyday she is throwing up more constantly, was dry heaving, now throwing up. Diarrhea constantly throughout the day. She is wearing depends to manage.     Past Medical History:  Diagnosis Date   Allergy    Anemia    Anxiety    Arthritis    CHF (congestive heart failure) (HCC)    COPD  (chronic obstructive pulmonary disease) (HCC)    Depression    GERD (gastroesophageal reflux disease)    Hypertension    MVP (mitral valve prolapse)    Parkinson disease     Past Surgical History:  Procedure Laterality Date   ABDOMINAL HYSTERECTOMY     APPENDECTOMY     BALLOON DILATION N/A 04/21/2020   Procedure: BALLOON DILATION;  Surgeon: Milus Banister, MD;  Location: [facility];  Service: Endoscopy;  Laterality: N/A;  pyloric/ GI   BALLOON DILATION N/A 07/04/2020   Procedure: BALLOON DILATION;  Surgeon: Mauri Pole, MD;  Location: [facility];  Service: Endoscopy;  Laterality: N/A;   BIOPSY  04/21/2020   Procedure: BIOPSY;  Surgeon: Milus Banister, MD;  Location: [facility];  Service: Endoscopy;;   BIOPSY  07/04/2020   Procedure: BIOPSY;  Surgeon: Mauri Pole, MD;  Location: [facility];  Service: Endoscopy;;  EGD and COLON   BREAST SURGERY Right 1988   tumor removal   CHOLECYSTECTOMY N/A 05/18/2020   Procedure: LAPAROSCOPIC CHOLECYSTECTOMY;  Surgeon: Johnathan Hausen, MD;  Location: [facility];  Service: General;  Laterality: N/A;   COLONOSCOPY WITH PROPOFOL N/A 07/04/2020   Procedure: COLONOSCOPY WITH PROPOFOL;  Surgeon: Mauri Pole, MD;  Location: [facility];  Service: Endoscopy;  Laterality: N/A;   COLONOSCOPY WITH PROPOFOL N/A  11/03/2021   Procedure: COLONOSCOPY WITH PROPOFOL;  Surgeon: Lesly Rubenstein, MD;  Location: [facility] END[facility]COPY;  Service: Endoscopy;  Laterality: N/A;   ESOPHAGEAL DILATION  08/11/2020   Procedure: PYLORIC DILATION;  Surgeon: Doran Stabler, MD;  Location: [facility];  Service: Gastroenterology;;   ESOPHAGOGASTRODUODENOSCOPY (EGD) WITH PROPOFOL N/A 04/21/2020   Procedure: ESOPHAGOGASTRODUODENOSCOPY (EGD) WITH PROPOFOL;  Surgeon: Milus Banister, MD;  Location: [facility];  Service: Endoscopy;  Laterality: N/A;   ESOPHAGOGASTRODUODENOSCOPY (EGD) WITH PROPOFOL N/A 07/04/2020   Procedure: ESOPHAGOGASTRODUODENOSCOPY (EGD) WITH  PROPOFOL;  Surgeon: Mauri Pole, MD;  Location: [facility];  Service: Endoscopy;  Laterality: N/A;   ESOPHAGOGASTRODUODENOSCOPY (EGD) WITH PROPOFOL N/A 08/11/2020   Procedure: ESOPHAGOGASTRODUODENOSCOPY (EGD) WITH PROPOFOL;  Surgeon: Doran Stabler, MD;  Location: [facility];  Service: Gastroenterology;  Laterality: N/A;   ESOPHAGOGASTRODUODENOSCOPY (EGD) WITH PROPOFOL N/A 11/03/2021   Procedure: ESOPHAGOGASTRODUODENOSCOPY (EGD) WITH PROPOFOL;  Surgeon: Lesly Rubenstein, MD;  Location: [facility] ENDOSCOPY;  Service: Endoscopy;  Laterality: N/A;   ESOPHAGOGASTRODUODENOSCOPY (EGD) WITH PROPOFOL N/A 05/07/2022   Procedure: ESOPHAGOGASTRODUODENOSCOPY (EGD) WITH PROPOFOL;  Surgeon: Lin Landsman, MD;  Location: [facility] ENDOSCOPY;  Service: Gastroenterology;  Laterality: N/A;   KNEE SURGERY  2005   2 times left   KNEE SURGERY Right    KYPHOPLASTY N/A 07/20/2021   Procedure: T 10KYPHOPLASTY;  Surgeon: Hessie Knows, MD;  Location: [facility] ORS;  Service: Orthopedics;  Laterality: N/A;   KYPHOPLASTY N/A 08/23/2021   Procedure: T9 KYPHOPLASTY;  Surgeon: Hessie Knows, MD;  Location: [facility] ORS;  Service: Orthopedics;  Laterality: N/A;   POLYPECTOMY  07/04/2020   Procedure: POLYPECTOMY;  Surgeon: Mauri Pole, MD;  Location: [facility];  Service: Endoscopy;;   TUBAL LIGATION      Family History  Problem Relation Age of Onset   Breast cancer Mother 79   Ovarian cancer Mother 59   Brain cancer Mother    Hypertension Father    Hyperlipidemia Father    Heart disease Father    Colon cancer Father        diagnosed in his 13s   Breast cancer Maternal Grandmother 35   Diabetes Paternal Grandmother    Breast cancer Paternal Grandmother 19   Other Son        drug over dose   Colon cancer Maternal Grandfather        dx in his 30s   Liver disease Maternal Grandfather    Breast cancer Maternal Aunt 60   Breast cancer Maternal Aunt 60   Colon cancer Maternal Uncle    Stomach cancer Neg  Hx    Pancreatic cancer Neg Hx    Esophageal cancer Neg Hx     Social History   Socioeconomic History   Marital status: Single    Spouse name: Not on file   Number of children: 2   Years of education: Not on file   Highest education level: Not on file  Occupational History   Not on file  Tobacco Use   Smoking status: Former    Packs/day: 1.00    Years: 15.00    Total pack years: 15.00    Types: Cigarettes    Quit date: 09/12/2017    Years since quitting: 5.0   Smokeless tobacco: Never  Vaping Use   Vaping Use: Never used  Substance and Sexual Activity   Alcohol use: Not Currently   Drug use: No   Sexual activity: Not Currently  Other Topics Concern   Not  on file  Social History Narrative   Lives with Grandson's great grandparents    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)   Daughter - [name] - lives in the [place] system - family, aunt, [name] and [name] (who she lives with), brother   Transportation: roommates   Enjoy: [employer], playing with grandson   Exercise: PT exercises   Diet: good - chicken, some steak, tries to eat healthy, avoids fried foods   Retired from being a Quarry manager   Social Determinants of Radio broadcast assistant Strain: [place]  (11/12/2018)   Overall Financial Resource Strain (CARDIA)    Difficulty of Paying Living Expenses: Not hard at all  Food Insecurity: No Food Insecurity (09/09/2022)   Hunger Vital Sign    Worried About Running Out of Food in the Last Year: Never true    [place] in the Last Year: Never true  Transportation Needs: No Transportation Needs (09/09/2022)   PRAPARE - Hydrologist (Medical): No    Lack of Transportation (Non-Medical): No  Physical Activity: Not on file  Stress: Not on file  Social Connections: Not on file  Intimate Partner Violence: Not At Risk (09/09/2022)   Humiliation, Afraid, Rape, and Kick questionnaire    Fear of Current or Ex-Partner: No    Emotionally  Abused: No    Physically Abused: No    Sexually Abused: No    Outpatient Medications Prior to Visit  Medication Sig Dispense Refill   acetaminophen (TYLENOL) 325 MG tablet Take 2 tablets (650 mg total) by mouth every 4 (four) hours as needed for mild pain (or temp > 37.5 C (99.5 F)).     carbidopa-levodopa (SINEMET IR) 25-100 MG tablet Take 2 tablets by mouth 3 (three) times daily.     escitalopram (LEXAPRO) 5 MG tablet Take 1 tablet (5 mg total) by mouth daily. 30 tablet 1   hydrOXYzine (ATARAX) 25 MG tablet Take 1 tablet (25 mg total) by mouth 2 (two) times daily as needed for anxiety or nausea. 30 tablet 0   ibuprofen (ADVIL) 400 MG tablet Take 1 tablet (400 mg total) by mouth every 8 (eight) hours as needed for headache or moderate pain. 20 tablet 0   oxyCODONE (ROXICODONE) 5 MG immediate release tablet Take 1 tablet (5 mg total) by mouth every 8 (eight) hours as needed for moderate pain. 6 tablet 0   OXYGEN Inhale 3 L/min into the lungs See admin instructions. 3 L/min at bedtime and as needed for shortness of breath throughout the day     promethazine (PHENERGAN) 12.5 MG tablet Take 1 tablet (12.5 mg total) by mouth every 12 (twelve) hours as needed for nausea or vomiting. 20 tablet 0   topiramate (TOPAMAX) 50 MG tablet Take 50 mg by mouth at bedtime.     No facility-administered medications prior to visit.    Allergies  Allergen Reactions   Meperidine Hives and Anaphylaxis   Strawberry Extract Hives and Swelling   Sucralfate Nausea Only and Other (See Comments)    Severe nausea Other reaction(s): Other (See Comments) Severe nausea Severe nausea   Wasp Venom Hives        Objective:    Physical Exam Constitutional:      General: She is in acute distress.     Appearance: Normal appearance. She is ill-appearing. She is not toxic-appearing or diaphoretic.  Cardiovascular:     Rate and Rhythm: Normal rate and  regular rhythm.  Pulmonary:     Effort: Pulmonary effort is  normal.     Breath sounds: Normal breath sounds.  Abdominal:     General: Bowel sounds are decreased. There is distension.     Tenderness: There is abdominal tenderness.     Hernia: No hernia is present.     Comments: Severe pain all quadrants barely able to palpate  Neurological:     Mental Status: She is alert.      BP 126/70   Pulse 63   Temp 97.9 F (36.6 C)   Resp 16   Ht '6\' 1"'$  (1.854 m)   Wt 176 lb 4 oz (79.9 kg)   SpO2 100%   BMI 23.25 kg/m  Wt Readings from Last 3 Encounters:  09/21/22 176 lb 4 oz (79.9 kg)  09/18/22 182 lb 15.7 oz (83 kg)  09/09/22 182 lb 15.7 oz (83 kg)     Health Maintenance Due  Topic Date Due   TETANUS/TDAP  Never done   Zoster Vaccines- Shingrix (1 of 2) Never done   Pneumonia Vaccine 63+ Years old (32 - PPSV23 or PCV20) 02/24/2020   COVID-19 Vaccine (3 - Moderna risk series) 05/13/2020   INFLUENZA VACCINE  07/17/2022    There are no preventive care reminders to display for this patient.  Lab Results  Component Value Date   TSH 1.377 08/03/2022   Lab Results  Component Value Date   WBC 8.8 09/18/2022   HGB 12.3 09/18/2022   HCT 38.5 09/18/2022   MCV 81.4 09/18/2022   PLT 357 09/18/2022   Lab Results  Component Value Date   NA 136 09/18/2022   K 2.9 (L) 09/18/2022   CO2 23 09/18/2022   GLUCOSE 150 (H) 09/18/2022   BUN 11 09/18/2022   CREATININE 1.02 (H) 09/18/2022   BILITOT 0.6 09/18/2022   ALKPHOS 174 (H) 09/18/2022   AST 20 09/18/2022   ALT 6 09/18/2022   PROT 6.8 09/18/2022   ALBUMIN 3.3 (L) 09/18/2022   CALCIUM 9.0 09/18/2022   ANIONGAP 12 09/18/2022   GFR 70.01 09/06/2022   Lab Results  Component Value Date   CHOL 132 01/19/2022   Lab Results  Component Value Date   HDL 54 01/19/2022   Lab Results  Component Value Date   LDLCALC 63 01/19/2022   Lab Results  Component Value Date   TRIG 77 01/19/2022   Lab Results  Component Value Date   CHOLHDL 2.4 01/19/2022   Lab Results  Component Value  Date   HGBA1C 6.2 04/27/2022      Assessment & Plan:   Problem List Items Addressed This Visit       Cardiovascular and Mediastinum   Hemiplegic migraine, not intractable, without status migrainosus    Refill topamax sent to pharmacy       Relevant Medications   topiramate (TOPAMAX) 50 MG tablet   Atherosclerosis of aorta (HCC)   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[place])    Reviewed 09/18/22 CTA  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 Advised pt to call for appt soonest available.       Diverticulosis   RESOLVED: Nausea vomiting and diarrhea   Relevant Medications   promethazine (PHENERGAN) 12.5 MG tablet   Other Relevant Orders   Alpha-Gal Panel   CALPROTECTIN   Celiac Pnl 2 rflx Endomysial Ab Ttr   H. pylori breath test     Other   Hypokalemia    Repeat bmp order placed pending results       Unintentional weight loss    Worry for this along with acute on chronic n/v/d pt referred back to GI for more urgent eval Also ordering celiac, calprotectin, cbc, bmp, alpha gal and h pylori Workup with GI pending as well      Elevated alkaline phosphatase level   History of ischemic stroke   History of colon polyps   RESOLVED: Generalized abdominal pain   Other Visit Diagnoses     Epigastric pain       Relevant Orders   Alpha-Gal Panel   CALPROTECTIN   Celiac Pnl 2 rflx Endomysial Ab Ttr   H. pylori breath test   Leukocytes in urine       Relevant  Orders   Urine Culture   Low blood potassium       Relevant Orders   Basic metabolic panel       Meds ordered this encounter  Medications   topiramate (TOPAMAX) 50 MG tablet    Sig: Take 1 tablet (50 mg total) by mouth at bedtime.    Dispense:  90 tablet    Refill:  0   promethazine (PHENERGAN) 12.5 MG tablet    Sig: Take 1 tablet (12.5 mg total) by mouth every 12 (twelve) hours as needed for nausea or vomiting.    Dispense:  20 tablet    Refill:  0    Follow-up: No follow-ups on file.    Eugenia Pancoast, FNP

## 2022-09-21 ENCOUNTER — Emergency Department (HOSPITAL_COMMUNITY)
Admission: EM | Admit: 2022-09-21 | Discharge: 2022-09-22 | Disposition: A | Discharge status: Home / Self Care | Payer: Medicare HMO | Attending: Emergency Medicine | Admitting: Emergency Medicine

## 2022-09-21 ENCOUNTER — Encounter (HOSPITAL_COMMUNITY): Payer: Self-pay

## 2022-09-21 ENCOUNTER — Other Ambulatory Visit: Payer: Self-pay

## 2022-09-21 ENCOUNTER — Ambulatory Visit (INDEPENDENT_AMBULATORY_CARE_PROVIDER_SITE_OTHER): Payer: Medicare HMO | Admitting: Family

## 2022-09-21 ENCOUNTER — Encounter: Payer: Self-pay | Admitting: Family

## 2022-09-21 VITALS — BP 126/70 | HR 63 | Temp 97.9°F | Resp 16 | Ht 73.0 in | Wt 176.2 lb

## 2022-09-21 DIAGNOSIS — I701 Atherosclerosis of renal artery: Secondary | ICD-10-CM | POA: Diagnosis not present

## 2022-09-21 DIAGNOSIS — R1013 Epigastric pain: Secondary | ICD-10-CM | POA: Diagnosis not present

## 2022-09-21 DIAGNOSIS — R82998 Other abnormal findings in urine: Secondary | ICD-10-CM

## 2022-09-21 DIAGNOSIS — G20A1 Parkinson's disease without dyskinesia, without mention of fluctuations: Secondary | ICD-10-CM | POA: Insufficient documentation

## 2022-09-21 DIAGNOSIS — I700 Atherosclerosis of aorta: Secondary | ICD-10-CM | POA: Diagnosis not present

## 2022-09-21 DIAGNOSIS — G43419 Hemiplegic migraine, intractable, without status migrainosus: Secondary | ICD-10-CM

## 2022-09-21 DIAGNOSIS — R197 Diarrhea, unspecified: Secondary | ICD-10-CM

## 2022-09-21 DIAGNOSIS — N280 Ischemia and infarction of kidney: Secondary | ICD-10-CM

## 2022-09-21 DIAGNOSIS — R1084 Generalized abdominal pain: Secondary | ICD-10-CM | POA: Diagnosis not present

## 2022-09-21 DIAGNOSIS — D175 Benign lipomatous neoplasm of intra-abdominal organs: Secondary | ICD-10-CM | POA: Insufficient documentation

## 2022-09-21 DIAGNOSIS — R109 Unspecified abdominal pain: Secondary | ICD-10-CM | POA: Diagnosis not present

## 2022-09-21 DIAGNOSIS — E876 Hypokalemia: Secondary | ICD-10-CM

## 2022-09-21 DIAGNOSIS — R112 Nausea with vomiting, unspecified: Secondary | ICD-10-CM | POA: Diagnosis not present

## 2022-09-21 DIAGNOSIS — K5790 Diverticulosis of intestine, part unspecified, without perforation or abscess without bleeding: Secondary | ICD-10-CM | POA: Diagnosis not present

## 2022-09-21 DIAGNOSIS — I509 Heart failure, unspecified: Secondary | ICD-10-CM | POA: Diagnosis not present

## 2022-09-21 DIAGNOSIS — I110 Hypertensive heart disease with heart failure: Secondary | ICD-10-CM | POA: Insufficient documentation

## 2022-09-21 DIAGNOSIS — R634 Abnormal weight loss: Secondary | ICD-10-CM

## 2022-09-21 DIAGNOSIS — J449 Chronic obstructive pulmonary disease, unspecified: Secondary | ICD-10-CM | POA: Insufficient documentation

## 2022-09-21 DIAGNOSIS — Z86010 Personal history of colonic polyps: Secondary | ICD-10-CM | POA: Insufficient documentation

## 2022-09-21 DIAGNOSIS — R1030 Lower abdominal pain, unspecified: Secondary | ICD-10-CM

## 2022-09-21 DIAGNOSIS — G43409 Hemiplegic migraine, not intractable, without status migrainosus: Secondary | ICD-10-CM

## 2022-09-21 DIAGNOSIS — R748 Abnormal levels of other serum enzymes: Secondary | ICD-10-CM | POA: Diagnosis not present

## 2022-09-21 DIAGNOSIS — Z9049 Acquired absence of other specified parts of digestive tract: Secondary | ICD-10-CM | POA: Diagnosis not present

## 2022-09-21 DIAGNOSIS — Z87891 Personal history of nicotine dependence: Secondary | ICD-10-CM | POA: Diagnosis not present

## 2022-09-21 DIAGNOSIS — Z8673 Personal history of transient ischemic attack (TIA), and cerebral infarction without residual deficits: Secondary | ICD-10-CM

## 2022-09-21 LAB — COMPREHENSIVE METABOLIC PANEL
ALT: <5 U/L (ref 0–44)
AST: 8 U/L — ABNORMAL LOW (ref 15–41)
Albumin: 2.8 g/dL — ABNORMAL LOW (ref 3.5–5.0)
Alkaline Phosphatase: 146 U/L — ABNORMAL HIGH (ref 38–126)
Anion gap: 14 (ref 5–15)
BUN: 9 mg/dL (ref 8–23)
CO2: 21 mmol/L — ABNORMAL LOW (ref 22–32)
Calcium: 8.9 mg/dL (ref 8.9–10.3)
Chloride: 103 mmol/L (ref 98–111)
Creatinine, Ser: 1 mg/dL (ref 0.44–1.00)
GFR, Estimated: >60 mL/min (ref >60)
Glucose, Bld: 103 mg/dL — ABNORMAL HIGH (ref 70–99)
Potassium: 2.8 mmol/L — ABNORMAL LOW (ref 3.5–5.1)
Sodium: 138 mmol/L (ref 135–145)
Total Bilirubin: 0.7 mg/dL (ref 0.3–1.2)
Total Protein: 5.4 g/dL — ABNORMAL LOW (ref 6.5–8.1)

## 2022-09-21 LAB — CBC WITH DIFFERENTIAL/PLATELET
Abs Immature Granulocytes: 0.03 10*3/uL (ref 0.00–0.07)
Basophils Absolute: 0 10*3/uL (ref 0.0–0.1)
Basophils Relative: 0 %
Eosinophils Absolute: 0 10*3/uL (ref 0.0–0.5)
Eosinophils Relative: 0 %
HCT: 32 % — ABNORMAL LOW (ref 36.0–46.0)
Hemoglobin: 10.6 g/dL — ABNORMAL LOW (ref 12.0–15.0)
Immature Granulocytes: 0 %
Lymphocytes Relative: 21 %
Lymphs Abs: 1.9 10*3/uL (ref 0.7–4.0)
MCH: 26.4 pg (ref 26.0–34.0)
MCHC: 33.1 g/dL (ref 30.0–36.0)
MCV: 79.8 fL — ABNORMAL LOW (ref 80.0–100.0)
Monocytes Absolute: 0.4 10*3/uL (ref 0.1–1.0)
Monocytes Relative: 4 %
Neutro Abs: 6.7 10*3/uL (ref 1.7–7.7)
Neutrophils Relative %: 75 %
Platelets: 260 10*3/uL (ref 150–400)
RBC: 4.01 MIL/uL (ref 3.87–5.11)
RDW: 15.9 % — ABNORMAL HIGH (ref 11.5–15.5)
WBC: 9.1 10*3/uL (ref 4.0–10.5)
nRBC: 0 % (ref 0.0–0.2)

## 2022-09-21 LAB — LIPASE, BLOOD: Lipase: 23 U/L (ref 11–51)

## 2022-09-21 MED ORDER — PROMETHAZINE HCL 12.5 MG PO TABS: 12.5000 mg | ORAL_TABLET | Freq: Two times a day (BID) | ORAL | 0 refills | Status: DC | PRN

## 2022-09-21 MED ORDER — TOPIRAMATE 50 MG PO TABS: 50.0000 mg | ORAL_TABLET | Freq: Every day | ORAL | 0 refills | Status: DC

## 2022-09-21 MED ORDER — ONDANSETRON 4 MG PO TBDP
4.0000 mg | ORAL_TABLET | Freq: Once | ORAL | Status: AC | PRN
  Administered 2022-09-21: 4 mg via ORAL
  Filled 2022-09-21: qty 1

## 2022-09-21 NOTE — Assessment & Plan Note (Signed)
Refill topamax sent to pharmacy

## 2022-09-21 NOTE — Assessment & Plan Note (Signed)
Nature of symptoms with ongoing n/v and constant lower abdominal pain pt referred back to GI, does see GI at [facility] clinic. Advised pt to call for appt soonest available.

## 2022-09-21 NOTE — ED Provider Triage Note (Signed)
Emergency Medicine Provider Triage Evaluation Note  Morgan [name] , a 71 y.o. female  was evaluated in triage.  Pt complains of worsening N/V and abdominal pain.  Seen in the ED 3 times for this over the last 2 weeks for same complaints.  Denies any new features.  Supposed to follow-up with vascular to discuss possible surgery for problematic lipoma.  Reports chronic dry heaving, unable to keep anything down.  Feels dehydrated.  Denies fever, chills.  Endorses some diarrhea with mucus.  Denies blood in bowel movements or vomit.  Denies chest pain or shortness of breath.  Review of Systems  Positive:  Negative: See above  Physical Exam  BP (!) 137/96   Pulse 93   Temp 98 F (36.7 C) (Oral)   Resp 18   Ht '6\' 1"'$  (1.854 m)   Wt 79.8 kg   SpO2 100%   BMI 23.22 kg/m  Gen:   Awake, no distress, appears uncomfortable and clinically dehydrated Resp:  Normal effort  MSK:   Moves extremities without difficulty  Other:  Abdomen soft, diffusely tender.  Actively dry heaving and belching on exam without emesis.  Medical Decision Making  Medically screening exam initiated at 2:44 PM.  Appropriate orders placed.  Morgan [name] was informed that the remainder of the evaluation will be completed by another provider, this initial triage assessment does not replace that evaluation, and the importance of remaining in the ED until their evaluation is complete.  Zofran ODT given.   Morgan Rome, PA-C 89/78/47 [id number]

## 2022-09-21 NOTE — Patient Instructions (Addendum)
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   I am reaching out to vascular however I want you to call as well to ask for urgent appointment due to near renal occlusion and stenosis.   Please schedule f/u appointment for future lab work when back from ER and feeling better for further workup.     Regards,   Eugenia Pancoast FNP-C

## 2022-09-21 NOTE — Assessment & Plan Note (Signed)
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

## 2022-09-21 NOTE — Assessment & Plan Note (Signed)
Repeat bmp order placed pending results

## 2022-09-21 NOTE — Assessment & Plan Note (Signed)
Worry for this along with acute on chronic n/v/d pt referred back to GI for more urgent eval Also ordering celiac, calprotectin, cbc, bmp, alpha gal and h pylori Workup with GI pending as well

## 2022-09-21 NOTE — ED Triage Notes (Signed)
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

## 2022-09-22 ENCOUNTER — Emergency Department (HOSPITAL_COMMUNITY): Payer: Medicare HMO

## 2022-09-22 DIAGNOSIS — Z9049 Acquired absence of other specified parts of digestive tract: Secondary | ICD-10-CM | POA: Diagnosis not present

## 2022-09-22 DIAGNOSIS — R109 Unspecified abdominal pain: Secondary | ICD-10-CM | POA: Diagnosis not present

## 2022-09-22 DIAGNOSIS — R112 Nausea with vomiting, unspecified: Secondary | ICD-10-CM | POA: Diagnosis not present

## 2022-09-22 MED ORDER — HYDROMORPHONE HCL 1 MG/ML IJ SOLN
1.0000 mg | Freq: Once | INTRAMUSCULAR | Status: AC
  Administered 2022-09-22: 1 mg via INTRAVENOUS
  Filled 2022-09-22: qty 1

## 2022-09-22 MED ORDER — ONDANSETRON HCL 4 MG/2ML IJ SOLN
4.0000 mg | Freq: Once | INTRAMUSCULAR | Status: AC
  Administered 2022-09-22: 4 mg via INTRAVENOUS
  Filled 2022-09-22: qty 2

## 2022-09-22 MED ORDER — IOHEXOL 350 MG/ML SOLN
75.0000 mL | Freq: Once | INTRAVENOUS | Status: AC | PRN
  Administered 2022-09-22: 75 mL via INTRAVENOUS

## 2022-09-22 MED ORDER — POTASSIUM CHLORIDE CRYS ER 20 MEQ PO TBCR
40.0000 meq | EXTENDED_RELEASE_TABLET | Freq: Once | ORAL | Status: AC
  Administered 2022-09-22: 40 meq via ORAL
  Filled 2022-09-22: qty 2

## 2022-09-22 MED ORDER — POTASSIUM CHLORIDE 10 MEQ/100ML IV SOLN
10.0000 meq | INTRAVENOUS | Status: DC
  Administered 2022-09-22: 10 meq via INTRAVENOUS
  Filled 2022-09-22: qty 100

## 2022-09-22 MED ORDER — SODIUM CHLORIDE 0.9 % IV BOLUS
1000.0000 mL | Freq: Once | INTRAVENOUS | Status: AC
  Administered 2022-09-22: 1000 mL via INTRAVENOUS

## 2022-09-22 MED ORDER — ONDANSETRON 4 MG PO TBDP: 4.0000 mg | ORAL_TABLET | Freq: Three times a day (TID) | ORAL | 0 refills | Status: DC | PRN

## 2022-09-22 NOTE — ED Notes (Signed)
Patient verbalizes understanding of discharge instructions. Opportunity for questioning and answers were provided. Armband removed by staff, pt discharged from ED. Pt taken to ED waiting room via wheel chair.  

## 2022-09-22 NOTE — ED Provider Notes (Signed)
[facility] Emergency Department Provider Note MRN:  [id number]  Arrival date & time: 09/22/22     Chief Complaint   Abdominal Pain and Emesis   History of Present Illness   Morgan [name] is a 71 y.o. year-old female with a history of CHF, COPD presenting to the ED with chief complaint of abdominal pain and vomiting.  Chronic nausea vomiting and abdominal pain for the past 2 months.  Recent CT scan as outpatient revealing renal artery stenosis.  Worsening pain over the past 3 days, located in the lower abdomen bilaterally.  Persistent nausea vomiting, diarrhea.  No fever.  Review of Systems  A thorough review of systems was obtained and all systems are negative except as noted in the HPI and PMH.   Patient's Health History    Past Medical History:  Diagnosis Date   Allergy    Anemia    Anxiety    Arthritis    CHF (congestive heart failure) (HCC)    COPD (chronic obstructive pulmonary disease) (HCC)    Depression    GERD (gastroesophageal reflux disease)    Hypertension    MVP (mitral valve prolapse)    Parkinson disease     Past Surgical History:  Procedure Laterality Date   ABDOMINAL HYSTERECTOMY     APPENDECTOMY     BALLOON DILATION N/A 04/21/2020   Procedure: BALLOON DILATION;  Surgeon: Milus Banister, MD;  Location: [facility];  Service: Endoscopy;  Laterality: N/A;  pyloric/ GI   BALLOON DILATION N/A 07/04/2020   Procedure: BALLOON DILATION;  Surgeon: Mauri Pole, MD;  Location: [facility];  Service: Endoscopy;  Laterality: N/A;   BIOPSY  04/21/2020   Procedure: BIOPSY;  Surgeon: Milus Banister, MD;  Location: [facility];  Service: Endoscopy;;   BIOPSY  07/04/2020   Procedure: BIOPSY;  Surgeon: Mauri Pole, MD;  Location: [facility];  Service: Endoscopy;;  EGD and COLON   BREAST SURGERY Right 1988   tumor removal   CHOLECYSTECTOMY N/A 05/18/2020   Procedure: LAPAROSCOPIC CHOLECYSTECTOMY;  Surgeon: Johnathan Hausen, MD;   Location: [facility];  Service: General;  Laterality: N/A;   COLONOSCOPY WITH PROPOFOL N/A 07/04/2020   Procedure: COLONOSCOPY WITH PROPOFOL;  Surgeon: Mauri Pole, MD;  Location: [facility];  Service: Endoscopy;  Laterality: N/A;   COLONOSCOPY WITH PROPOFOL N/A 11/03/2021   Procedure: COLONOSCOPY WITH PROPOFOL;  Surgeon: Lesly Rubenstein, MD;  Location: [facility];  Service: Endoscopy;  Laterality: N/A;   ESOPHAGEAL DILATION  08/11/2020   Procedure: PYLORIC DILATION;  Surgeon: Doran Stabler, MD;  Location: [facility];  Service: Gastroenterology;;   ESOPHAGOGASTRODUODENOSCOPY (EGD) WITH PROPOFOL N/A 04/21/2020   Procedure: ESOPHAGOGASTRODUODENOSCOPY (EGD) WITH PROPOFOL;  Surgeon: Milus Banister, MD;  Location: [facility];  Service: Endoscopy;  Laterality: N/A;   ESOPHAGOGASTRODUODENOSCOPY (EGD) WITH PROPOFOL N/A 07/04/2020   Procedure: ESOPHAGOGASTRODUODENOSCOPY (EGD) WITH PROPOFOL;  Surgeon: Mauri Pole, MD;  Location: [facility];  Service: Endoscopy;  Laterality: N/A;   ESOPHAGOGASTRODUODENOSCOPY (EGD) WITH PROPOFOL N/A 08/11/2020   Procedure: ESOPHAGOGASTRODUODENOSCOPY (EGD) WITH PROPOFOL;  Surgeon: Doran Stabler, MD;  Location: [facility];  Service: Gastroenterology;  Laterality: N/A;   ESOPHAGOGASTRODUODENOSCOPY (EGD) WITH PROPOFOL N/A 11/03/2021   Procedure: ESOPHAGOGASTRODUODENOSCOPY (EGD) WITH PROPOFOL;  Surgeon: Lesly Rubenstein, MD;  Location: [facility];  Service: Endoscopy;  Laterality: N/A;   ESOPHAGOGASTRODUODENOSCOPY (EGD) WITH PROPOFOL N/A 05/07/2022   Procedure: ESOPHAGOGASTRODUODENOSCOPY (EGD) WITH PROPOFOL;  Surgeon: Lin Landsman, MD;  Location:  [facility];  Service: Gastroenterology;  Laterality: N/A;   KNEE SURGERY  2005   2 times left   KNEE SURGERY Right    KYPHOPLASTY N/A 07/20/2021   Procedure: T 10KYPHOPLASTY;  Surgeon: Hessie Knows, MD;  Location: [facility];  Service: Orthopedics;  Laterality: N/A;   KYPHOPLASTY N/A  08/23/2021   Procedure: T9 KYPHOPLASTY;  Surgeon: Hessie Knows, MD;  Location: [facility];  Service: Orthopedics;  Laterality: N/A;   POLYPECTOMY  07/04/2020   Procedure: POLYPECTOMY;  Surgeon: Mauri Pole, MD;  Location: [facility];  Service: Endoscopy;;   TUBAL LIGATION      Family History  Problem Relation Age of Onset   Breast cancer Mother 62   Ovarian cancer Mother 79   Brain cancer Mother    Hypertension Father    Hyperlipidemia Father    Heart disease Father    Colon cancer Father        diagnosed in his 66s   Breast cancer Maternal Grandmother 69   Diabetes Paternal Grandmother    Breast cancer Paternal Grandmother 71   Other Son        drug over dose   Colon cancer Maternal Grandfather        dx in his 24s   Liver disease Maternal Grandfather    Breast cancer Maternal Aunt 60   Breast cancer Maternal Aunt 60   Colon cancer Maternal Uncle    Stomach cancer Neg Hx    Pancreatic cancer Neg Hx    Esophageal cancer Neg Hx     Social History   Socioeconomic History   Marital status: Single    Spouse name: Not on file   Number of children: 2   Years of education: Not on file   Highest education level: Not on file  Occupational History   Not on file  Tobacco Use   Smoking status: Former    Packs/day: 1.00    Years: 15.00    Total pack years: 15.00    Types: Cigarettes    Quit date: 09/12/2017    Years since quitting: 5.0   Smokeless tobacco: Never  Vaping Use   Vaping Use: Never used  Substance and Sexual Activity   Alcohol use: Not Currently   Drug use: No   Sexual activity: Not Currently  Other Topics Concern   Not on file  Social History Narrative   Lives with Grandson's great grandparents    28 - 35 years old ([place])   Daughter - [name] - lives in the [place] system - family, aunt, [name] and [name] (who she lives with), brother   Transportation: roommates   Enjoy: [employer], playing with grandson   Exercise: PT  exercises   Diet: good - chicken, some steak, tries to eat healthy, avoids fried foods   Retired from being a Quarry manager   Social Determinants of Radio broadcast assistant Strain: [address]  (11/12/2018)   Overall Financial Resource Strain (CARDIA)    Difficulty of Paying Living Expenses: Not hard at all  Food Insecurity: No Food Insecurity (09/09/2022)   Hunger Vital Sign    Worried About Running Out of Food in the Last Year: Never true    Ran Out of Food in the Last Year: Never true  Transportation Needs: No Transportation Needs (09/09/2022)   PRAPARE - Hydrologist (Medical): No    Lack of Transportation (Non-Medical): No  Physical Activity: Not on file  Stress:  Not on file  Social Connections: Not on file  Intimate Partner Violence: Not At Risk (09/09/2022)   Humiliation, Afraid, Rape, and Kick questionnaire    Fear of Current or Ex-Partner: No    Emotionally Abused: No    Physically Abused: No    Sexually Abused: No     Physical Exam   Vitals:   09/22/22 0008 09/22/22 0247  BP: (!) 159/99 (!) 158/86  Pulse: 80 78  Resp: 20 15  Temp: 98.2 F (36.8 C) 98.1 F (36.7 C)  SpO2: 100% 98%    CONSTITUTIONAL: Well-appearing, actively retching, appears uncomfortable NEURO/PSYCH:  Alert and oriented x 3, no focal deficits EYES:  eyes equal and reactive ENT/NECK:  no LAD, no JVD CARDIO: Regular rate, well-perfused, normal S1 and S2 PULM:  CTAB no wheezing or rhonchi GI/GU: Moderate tenderness to lower abdomen MSK/SPINE:  No gross deformities, no edema SKIN:  no rash, atraumatic   *Additional and/or pertinent findings included in MDM below  Diagnostic and Interventional Summary    EKG Interpretation  Date/Time:    Ventricular Rate:    PR Interval:    QRS Duration:   QT Interval:    QTC Calculation:   R Axis:     Text Interpretation:         Labs Reviewed  COMPREHENSIVE METABOLIC PANEL - Abnormal; Notable for the following components:       Result Value   Potassium 2.8 (*)    CO2 21 (*)    Glucose, Bld 103 (*)    Total Protein 5.4 (*)    Albumin 2.8 (*)    AST 8 (*)    Alkaline Phosphatase 146 (*)    All other components within normal limits  CBC WITH DIFFERENTIAL/PLATELET - Abnormal; Notable for the following components:   Hemoglobin 10.6 (*)    HCT 32.0 (*)    MCV 79.8 (*)    RDW 15.9 (*)    All other components within normal limits  LIPASE, BLOOD  URINALYSIS, ROUTINE W REFLEX MICROSCOPIC    CT Angio Abd/Pel w/ and/or w/o  Final Result      Medications  potassium chloride 10 mEq in 100 mL IVPB (10 mEq Intravenous Not Given 09/22/22 0249)  potassium chloride SA (KLOR-CON M) CR tablet 40 mEq (has no administration in time range)  ondansetron (ZOFRAN-ODT) disintegrating tablet 4 mg (4 mg Oral Given 09/21/22 1442)  HYDROmorphone (DILAUDID) injection 1 mg (1 mg Intravenous Given 09/22/22 0045)  ondansetron (ZOFRAN) injection 4 mg (4 mg Intravenous Given 09/22/22 0046)  sodium chloride 0.9 % bolus 1,000 mL (0 mLs Intravenous Stopped 09/22/22 0248)  iohexol (OMNIPAQUE) 350 MG/ML injection 75 mL (75 mLs Intravenous Contrast Given 09/22/22 0118)     Procedures  /  Critical Care Procedures  ED Course and Medical Decision Making  Initial Impression and Ddx Differential diagnosis includes worsening renal artery stenosis, mesenteric ischemia, perforated viscus, appendicitis, diverticulitis, gastroenteritis, will need repeat CT imaging given the degree of tenderness and worsening of symptoms.  May need to reach out to vascular surgery.  Past medical/surgical history that increases complexity of ED encounter: Renal artery stenosis, CHF, COPD.  Interpretation of Diagnostics I personally reviewed the laboratory assessment and my interpretation is as follows: Hypokalemia, otherwise no significant blood count or electrolyte disturbance  CT imaging showing no worsening of the renal artery stenosis, no signs of mesenteric  ischemia, no real pathology to explain patient's pain.  Patient Reassessment and Ultimate Disposition/Management     Potassium  was repleted, given pain control.  Observed for a few hours and patient's symptoms are resolved, no longer having any pain, tolerating p.o.  She is wondering when she can go home.  Reassuring vital signs, reassuring repeat abdominal exam.  Given that her renal function is at baseline and her symptoms are resolved I see no indication for emergent vascular consultation, she is already established with vascular surgery and will call on Monday.  Patient management required discussion with the following services or consulting groups:  None  Complexity of Problems Addressed Acute illness or injury that poses threat of life of bodily function  Additional Data Reviewed and Analyzed Further history obtained from: Prior labs/imaging results  Additional Factors Impacting ED Encounter Risk Use of parenteral controlled substances and Consideration of hospitalization  Barth Kirks. Sedonia Small, MD [place] [email or web address]  Final Clinical Impressions(s) / ED Diagnoses     ICD-10-CM   1. Nausea vomiting and diarrhea  R11.2    R19.7     2. Lower abdominal pain  R10.30     3. Renal artery stenosis (HCC)  I70.1       ED Discharge Orders          Ordered    ondansetron (ZOFRAN-ODT) 4 MG disintegrating tablet  Every 8 hours PRN        09/22/22 0249             Discharge Instructions Discussed with and Provided to Patient:     Discharge Instructions      You were evaluated in the Emergency Department and after careful evaluation, we did not find any emergent condition requiring admission or further testing in the hospital.  Your exam/testing today was overall reassuring.  Important that you call your vascular surgeon Monday morning to establish follow-up for your renal artery stenosis.  Continue follow-up with your  primary care doctor or gastroenterologist to discuss your continued abdominal pain, nausea vomiting.  Please return to the Emergency Department if you experience any worsening of your condition.  Thank you for allowing [place] to be a part of your care.        Maudie Flakes, MD 09/22/22 [phone]

## 2022-09-22 NOTE — Discharge Instructions (Signed)
You were evaluated in the Emergency Department and after careful evaluation, we did not find any emergent condition requiring admission or further testing in the hospital.  Your exam/testing today was overall reassuring.  Important that you call your vascular surgeon Monday morning to establish follow-up for your renal artery stenosis.  Continue follow-up with your primary care doctor or gastroenterologist to discuss your continued abdominal pain, nausea vomiting.  Please return to the Emergency Department if you experience any worsening of your condition.  Thank you for allowing [place] to be a part of your care.

## 2022-09-23 ENCOUNTER — Encounter: Payer: Self-pay | Admitting: Intensive Care

## 2022-09-23 ENCOUNTER — Emergency Department
Admission: EM | Admit: 2022-09-23 | Discharge: 2022-09-24 | Disposition: A | Discharge status: Home / Self Care | Payer: Medicare HMO | Attending: Emergency Medicine | Admitting: Emergency Medicine

## 2022-09-23 ENCOUNTER — Other Ambulatory Visit: Payer: Self-pay

## 2022-09-23 DIAGNOSIS — N189 Chronic kidney disease, unspecified: Secondary | ICD-10-CM | POA: Insufficient documentation

## 2022-09-23 DIAGNOSIS — R1084 Generalized abdominal pain: Secondary | ICD-10-CM | POA: Diagnosis not present

## 2022-09-23 DIAGNOSIS — R197 Diarrhea, unspecified: Secondary | ICD-10-CM | POA: Insufficient documentation

## 2022-09-23 DIAGNOSIS — I130 Hypertensive heart and chronic kidney disease with heart failure and stage 1 through stage 4 chronic kidney disease, or unspecified chronic kidney disease: Secondary | ICD-10-CM | POA: Insufficient documentation

## 2022-09-23 DIAGNOSIS — I5030 Unspecified diastolic (congestive) heart failure: Secondary | ICD-10-CM | POA: Diagnosis not present

## 2022-09-23 DIAGNOSIS — R112 Nausea with vomiting, unspecified: Secondary | ICD-10-CM | POA: Diagnosis not present

## 2022-09-23 LAB — COMPREHENSIVE METABOLIC PANEL
ALT: 6 U/L (ref 0–44)
AST: 16 U/L (ref 15–41)
Albumin: 3.1 g/dL — ABNORMAL LOW (ref 3.5–5.0)
Alkaline Phosphatase: 146 U/L — ABNORMAL HIGH (ref 38–126)
Anion gap: 8 (ref 5–15)
BUN: 10 mg/dL (ref 8–23)
CO2: 27 mmol/L (ref 22–32)
Calcium: 8.6 mg/dL — ABNORMAL LOW (ref 8.9–10.3)
Chloride: 105 mmol/L (ref 98–111)
Creatinine, Ser: 1 mg/dL (ref 0.44–1.00)
GFR, Estimated: >60 mL/min (ref >60)
Glucose, Bld: 114 mg/dL — ABNORMAL HIGH (ref 70–99)
Potassium: 3.2 mmol/L — ABNORMAL LOW (ref 3.5–5.1)
Sodium: 140 mmol/L (ref 135–145)
Total Bilirubin: 0.5 mg/dL (ref 0.3–1.2)
Total Protein: 6.2 g/dL — ABNORMAL LOW (ref 6.5–8.1)

## 2022-09-23 LAB — LIPASE, BLOOD: Lipase: 32 U/L (ref 11–51)

## 2022-09-23 LAB — CBC
HCT: 34.8 % — ABNORMAL LOW (ref 36.0–46.0)
Hemoglobin: 10.8 g/dL — ABNORMAL LOW (ref 12.0–15.0)
MCH: 25.5 pg — ABNORMAL LOW (ref 26.0–34.0)
MCHC: 31 g/dL (ref 30.0–36.0)
MCV: 82.3 fL (ref 80.0–100.0)
Platelets: 313 10*3/uL (ref 150–400)
RBC: 4.23 MIL/uL (ref 3.87–5.11)
RDW: 17 % — ABNORMAL HIGH (ref 11.5–15.5)
WBC: 9.9 10*3/uL (ref 4.0–10.5)
nRBC: 0 % (ref 0.0–0.2)

## 2022-09-23 MED ORDER — DEXTROSE 5 % IN LACTATED RINGERS IV BOLUS
1000.0000 mL | Freq: Once | INTRAVENOUS | Status: AC
  Administered 2022-09-23: 1000 mL via INTRAVENOUS
  Filled 2022-09-23: qty 1000

## 2022-09-23 MED ORDER — MAGNESIUM SULFATE 2 GM/50ML IV SOLN
2.0000 g | INTRAVENOUS | Status: AC
  Administered 2022-09-23: 2 g via INTRAVENOUS
  Filled 2022-09-23: qty 50

## 2022-09-23 MED ORDER — DICYCLOMINE HCL 10 MG/ML IM SOLN
20.0000 mg | Freq: Once | INTRAMUSCULAR | Status: AC
  Administered 2022-09-23: 20 mg via INTRAMUSCULAR
  Filled 2022-09-23 (×2): qty 2

## 2022-09-23 MED ORDER — HALOPERIDOL LACTATE 5 MG/ML IJ SOLN
2.0000 mg | Freq: Once | INTRAMUSCULAR | Status: AC
  Administered 2022-09-23: 2 mg via INTRAMUSCULAR
  Filled 2022-09-23: qty 1

## 2022-09-23 MED ORDER — METOCLOPRAMIDE HCL 5 MG/ML IJ SOLN
10.0000 mg | INTRAMUSCULAR | Status: AC
  Administered 2022-09-23: 10 mg via INTRAVENOUS
  Filled 2022-09-23: qty 2

## 2022-09-23 MED ORDER — ONDANSETRON HCL 4 MG/2ML IJ SOLN: 4.0000 mg | Freq: Once | INTRAMUSCULAR | Status: DC

## 2022-09-23 MED ORDER — LORAZEPAM 2 MG/ML IJ SOLN
1.0000 mg | Freq: Once | INTRAMUSCULAR | Status: AC
  Administered 2022-09-23: 1 mg via INTRAMUSCULAR
  Filled 2022-09-23: qty 1

## 2022-09-23 MED ORDER — ONDANSETRON 4 MG PO TBDP: 4.0000 mg | ORAL_TABLET | Freq: Once | ORAL | Status: DC

## 2022-09-23 MED ORDER — KETOROLAC TROMETHAMINE 15 MG/ML IJ SOLN: 15.0000 mg | Freq: Once | INTRAMUSCULAR | Status: DC

## 2022-09-23 NOTE — ED Notes (Signed)
Pt denies any relief after receiving Haldol.  Pt denies any relief after receiving Bentyl and Ativan.  As of right now, pt has failed two PO challenges.

## 2022-09-23 NOTE — ED Triage Notes (Signed)
Patient c/o diarrhea and emesis. Has been seen multiple times for same. Diagnosed with renal artery stenosis. Reports she has zofran at home with no relief from emesis.

## 2022-09-23 NOTE — ED Provider Notes (Signed)
[facility] Provider Note    Event Date/Time   First MD Initiated Contact with Patient 09/23/22 1853     (approximate)   History   Chief Complaint: Emesis and Diarrhea   HPI  Morgan [name] is a 71 y.o. female with a history of chronic pain, hypertension, depression, GERD CKD D CHF who comes ED complaining of vomiting throughout the day today.  Tried Zofran at home without relief.  She has had frequent ED visits for similar symptoms.  Complains of generalized abdominal pain, nonradiating.  No hematemesis no fever.     Physical Exam   Triage Vital Signs: ED Triage Vitals  Enc Vitals Group     BP 09/23/22 1103 (!) 130/99     Pulse Rate 09/23/22 1103 96     Resp 09/23/22 1103 18     Temp 09/23/22 1103 98.1 F (36.7 C)     Temp Source 09/23/22 1103 Oral     SpO2 09/23/22 1103 96 %     Weight 09/23/22 1104 174 lb (78.9 kg)     Height 09/23/22 1104 '6\' 1"'$  (1.854 m)     Head Circumference --      Peak Flow --      Pain Score 09/23/22 1104 8     Pain Loc --      Pain Edu? --      Excl. in [place]? --     Most recent vital signs: Vitals:   09/23/22 2200 09/23/22 2230  BP: (!) 162/90 (!) 153/79  Pulse: 71 69  Resp: (P) 16 16  Temp:    SpO2: 99% 98%    General: Awake, no distress.  Very anxious CV:  Good peripheral perfusion.  Resp:  Normal effort.  Abd:  No distention.  No focal tenderness.  Not peritoneal.    ED Results / Procedures / Treatments   Labs (all labs ordered are listed, but only abnormal results are displayed) Labs Reviewed  COMPREHENSIVE METABOLIC PANEL - Abnormal; Notable for the following components:      Result Value   Potassium 3.2 (*)    Glucose, Bld 114 (*)    Calcium 8.6 (*)    Total Protein 6.2 (*)    Albumin 3.1 (*)    Alkaline Phosphatase 146 (*)    All other components within normal limits  CBC - Abnormal; Notable for the following components:   Hemoglobin 10.8 (*)    HCT 34.8 (*)    MCH 25.5 (*)    RDW 17.0  (*)    All other components within normal limits  LIPASE, BLOOD  URINALYSIS, ROUTINE W REFLEX MICROSCOPIC     EKG    RADIOLOGY    PROCEDURES:  Procedures   MEDICATIONS ORDERED IN ED: Medications  dextrose 5% lactated ringers bolus 1,000 mL (has no administration in time range)  metoCLOPramide (REGLAN) injection 10 mg (has no administration in time range)  magnesium sulfate IVPB 2 g 50 mL (has no administration in time range)  haloperidol lactate (HALDOL) injection 2 mg (2 mg Intramuscular Given 09/23/22 2040)  LORazepam (ATIVAN) injection 1 mg (1 mg Intramuscular Given 09/23/22 2136)  dicyclomine (BENTYL) injection 20 mg (20 mg Intramuscular Given 09/23/22 2136)     IMPRESSION / MDM / ASSESSMENT AND PLAN / ED COURSE  I reviewed the triage vital signs and the nursing notes.  Differential diagnosis includes, but is not limited to, dehydration, electrolyte abnormality, AKI, cyclic vomiting syndrome, GERD  Patient's presentation is most consistent with acute presentation with potential threat to life or bodily function.  Patient presents with intractable vomiting.  She was given Haldol, Ativan, Bentyl without relief.  We will give IV fluids and IV Reglan and reassess.       FINAL CLINICAL IMPRESSION(S) / ED DIAGNOSES   Final diagnoses:  Intractable vomiting with nausea     Rx / DC Orders   ED Discharge Orders     None        Note:  This document was prepared using Dragon voice recognition software and may include unintentional dictation errors.   Carrie Mew, MD 09/23/22 2311

## 2022-09-24 ENCOUNTER — Telehealth: Payer: Self-pay | Admitting: Family

## 2022-09-24 LAB — URINALYSIS, ROUTINE W REFLEX MICROSCOPIC
Bilirubin Urine: NEGATIVE
Glucose, UA: NEGATIVE mg/dL
Hgb urine dipstick: NEGATIVE
Ketones, ur: NEGATIVE mg/dL
Nitrite: NEGATIVE
Protein, ur: NEGATIVE mg/dL
Specific Gravity, Urine: 1.015 (ref 1.005–1.030)
pH: 7 (ref 5.0–8.0)

## 2022-09-24 MED ORDER — METOCLOPRAMIDE HCL 10 MG PO TABS: 10.0000 mg | ORAL_TABLET | Freq: Three times a day (TID) | ORAL | 1 refills | Status: DC

## 2022-09-24 MED ORDER — DROPERIDOL 2.5 MG/ML IJ SOLN
2.5000 mg | Freq: Once | INTRAMUSCULAR | Status: AC
  Administered 2022-09-24: 2.5 mg via INTRAVENOUS
  Filled 2022-09-24: qty 2

## 2022-09-24 NOTE — ED Notes (Signed)
Po water given..the patient took approximately 46m orally. Pt immediately vomited. Assisted to [facility]to void, sample collected and sent to lab for testing.

## 2022-09-24 NOTE — ED Provider Notes (Signed)
Patient received in signout from Dr. Joni Fears pending reevaluation after another round of IV fluids and antiemetics.  She had intermittent nausea and emesis, but resolved with a dose of droperidol.  Subsequent tolerating p.o. intake and requesting discharge.  We will provide a prescription for Reglan and discussed return precautions for the ED.   Vladimir Crofts, MD 09/24/22 [phone]

## 2022-09-24 NOTE — ED Notes (Signed)
DC instructions given verbally and in writing, understanding voiced, signature obtained.  Pt taken via wheelchair to lobby to await ride.

## 2022-09-24 NOTE — ED Notes (Signed)
Attempted to restart IV.. unsuccessful x2.

## 2022-09-24 NOTE — Telephone Encounter (Signed)
Patient called in and stated that she would like to change her vascular doctor and discuss some pain medication. She can be reached at [phone]. Thank you!

## 2022-09-24 NOTE — ED Notes (Signed)
Transfer of care report given to [facility] RN.

## 2022-09-24 NOTE — Telephone Encounter (Signed)
Spoke to pt and scheduled her appt for 09/25/2022

## 2022-09-25 ENCOUNTER — Ambulatory Visit: Payer: Medicare HMO | Admitting: Family

## 2022-09-25 ENCOUNTER — Encounter: Payer: Self-pay | Admitting: Pulmonary Disease

## 2022-09-25 ENCOUNTER — Ambulatory Visit (INDEPENDENT_AMBULATORY_CARE_PROVIDER_SITE_OTHER): Payer: Medicare HMO | Admitting: Pulmonary Disease

## 2022-09-25 ENCOUNTER — Encounter: Payer: Medicare HMO | Admitting: Family

## 2022-09-25 VITALS — BP 132/68 | HR 53 | Temp 98.6°F | Wt 180.4 lb

## 2022-09-25 DIAGNOSIS — J9611 Chronic respiratory failure with hypoxia: Secondary | ICD-10-CM

## 2022-09-25 DIAGNOSIS — R1312 Dysphagia, oropharyngeal phase: Secondary | ICD-10-CM

## 2022-09-25 DIAGNOSIS — I701 Atherosclerosis of renal artery: Secondary | ICD-10-CM | POA: Diagnosis not present

## 2022-09-25 NOTE — Progress Notes (Signed)
$'@Patient'H$  ID: Morgan [name], female    DOB: [birth year], 71 y.o.   MRN: [id number]  Chief Complaint  Patient presents with   Follow-up    Follow up for restrictive lung disease. Pt had HRCT in August. She states she has been in and out of the hospital since last ov.     Referring provider: [name], MD  HPI:   71 y.o. woman whom we are seeing in follow up for evaluation of chronic cough/bronchitis.  Discharge summary 07/2022 x2 reviewed, 08/2022 reviewed.  ED note x2 since then reviewed..  Returns for routine follow-up.  Dyspnea largely unchanged.  Not using inhalers as we stopped them at last visit.  PFTs last time suggestive of restriction.  CT chest was ordered which shows very mild upper lobe interlobular septal thickening, otherwise clear.  Discussed at length.  Unfortunately, has been in and out of the hospital several times with nausea and vomiting, syncope preceding that.  She thinks she has renal artery stenosis.  She has upcoming follow-up with her PCP today.  Sounds like possible surgical bypass.  HPI at initial visit: Patient reports onset of symptoms about 2 months ago.  Started with fever, runny nose, sore throat.  The symptoms resolved but cough persisted.  Productive.  Initially yellow to clear phlegm.  Worsened over the last months to more green phlegm.  Associate with worsening dyspnea on exertion.  No times a day with things are better or worse.  No position to make things better or worse.  She thinks the pollens have made nasal congestion and some cough worse given chronic issues with pollens and nasal/sinus congestion with seasonal changes in the past.  Has been using Advair without much improvement.  Was given a course of prednisone and Augmentin about 1 month ago.  This did help for period of time but symptoms have recurred and back to as bad as they were prior.  Was placed on prednisone and levofloxacin yesterday via PCP.  Review chest x-ray 03/14/2022 that on my  review and interpretation reveals hyperinflation with what appears to be chronic bronchial scarring/inflammation.  Review chest x-ray 04/12/2022 that on my interpretation is unchanged from earlier x-ray in March.  PMH: Tobacco abuse in remission, seasonal allergies, hypertension Surgical history: Hysterectomy, appendectomy, esophageal dilation, knee surgery, cholecystectomy Family history: Mother with history of breast cancer, ovarian cancer, father with hypertension, hyperlipidemia, CAD, colon cancer in 64s Social history: Former smoker, Printmaker, quit 2015, retired Futures trader / Pulmonary Flowsheets:   ACT:      No data to display           MMRC:     No data to display           Epworth:      No data to display           Tests:   FENO:  No results found for: "NITRICOXIDE"  PFT:    Latest Ref Rng & Units 07/16/2022    8:57 AM  PFT Results  FVC-Pre L 2.07   FVC-Predicted Pre % 52   FVC-Post L 2.24   FVC-Predicted Post % 56   Pre FEV1/FVC % % 82   Post FEV1/FCV % % 82   FEV1-Pre L 1.69   FEV1-Predicted Pre % 56   FEV1-Post L 1.84   DLCO uncorrected ml/min/mmHg 13.18   DLCO UNC% % 53   DLCO corrected ml/min/mmHg 14.95   DLCO COR %Predicted % 61  DLVA Predicted % 98   TLC L 4.66   TLC % Predicted % 76   RV % Predicted % 92   Personally reviewed interpreted as spirometry just above moderate restriction versus air trapping.  No bronchodilator spots.  TLC confirms mild restriction.  DLCO moderately reduced.  WALK:      No data to display           Imaging: Personally reviewed and as per EMR discussion this note CT Angio Abd/Pel w/ and/or w/o  Result Date: 09/22/2022 CLINICAL DATA:  Renal artery stenosis with acute worsening of pain. Increased nausea and vomiting. 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 EXAM: CTA ABDOMEN AND PELVIS WITHOUT AND WITH CONTRAST TECHNIQUE: Multidetector CT imaging of the abdomen and pelvis was performed using the standard  protocol during bolus administration of intravenous contrast. Multiplanar reconstructed images and MIPs were obtained and reviewed to evaluate the vascular anatomy. RADIATION DOSE REDUCTION: This exam was performed according to the departmental dose-optimization program which includes automated exposure control, adjustment of the mA and/or kV according to patient size and/or use of iterative reconstruction technique. CONTRAST:  30m OMNIPAQUE IOHEXOL 350 MG/ML SOLN COMPARISON:  09/18/2022 FINDINGS: VASCULAR Aorta: Diffuse aortic calcification. No aneurysm or dissection. No significant stenosis. Patent throughout. Celiac: Calcification at the origin. Widely patent without evidence of significant stenosis. No aneurysm. SMA: Patent without evidence of aneurysm, dissection, vasculitis or significant stenosis. Renals: Calcifications. Proximal left renal artery stenosis with mild post stenotic dilatation. No change since prior study. The vessel remains patent and nephrograms are symmetrical. IMA: Patent without evidence of aneurysm, dissection, vasculitis or significant stenosis. Inflow: Patent without evidence of aneurysm, dissection, vasculitis or significant stenosis. Proximal Outflow: Bilateral common femoral and visualized portions of the superficial and profunda femoral arteries are patent without evidence of aneurysm, dissection, vasculitis or significant stenosis. Veins: No obvious venous abnormality within the limitations of this arterial phase study. Review of the MIP images confirms the above findings. NON-VASCULAR Lower chest: Lungs are clear. Hepatobiliary: No focal liver abnormality is seen. Status post cholecystectomy. No biliary dilatation. Pancreas: Unremarkable. No pancreatic ductal dilatation or surrounding inflammatory changes. Spleen: Normal in size without focal abnormality. Adrenals/Urinary Tract: Adrenal glands are unremarkable. Kidneys are normal, without renal calculi, focal lesion, or  hydronephrosis. Bladder is unremarkable. Stomach/Bowel: Stomach, small bowel, and colon are not abnormally distended. No wall thickening or inflammatory changes. Colonic diverticulosis without evidence of acute diverticulitis. Suggestion of small lipoma along the mid transverse colon with peripheral calcification, measuring about 1 cm diameter, unchanged since prior study. It is possible that this represents a involuting diverticulum. Lipomatous hypertrophy of the ileocecal valve. Mucosal fat throughout the colon is diffusely prominent, likely physiologic. Lymphatic: No significant lymphadenopathy. Reproductive: Status post hysterectomy. No adnexal masses. Other: No free air or free fluid in the abdomen. Abdominal wall musculature appears intact. Musculoskeletal: Mild degenerative changes in the spine. Kyphoplasty changes in the lower thoracic spine. IMPRESSION: VASCULAR Aortic atherosclerosis. No aortic aneurysm. 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 The vessel remains patent and nephrograms are symmetrical. No change. NON-VASCULAR No change in appearance. No evidence of bowel obstruction or inflammation. Suggestion of a small lipoma along the mid transverse colon without interval change. Electronically Signed   By: WLucienne CapersM.D.   On: 09/22/2022 01:56   CT Angio Abd/Pel W and/or Wo Contrast  Result Date: 09/18/2022 CLINICAL DATA:  71year old with vomiting and diarrhea. Evaluate for acute mesenteric ischemia. EXAM: CTA ABDOMEN  AND PELVIS WITHOUT AND WITH CONTRAST TECHNIQUE: Multidetector CT imaging of the abdomen and pelvis was performed using the standard protocol during bolus administration of intravenous contrast. Multiplanar reconstructed images and MIPs were obtained and reviewed to evaluate the vascular anatomy. RADIATION DOSE REDUCTION: This exam was performed according to the departmental dose-optimization program which includes  automated exposure control, adjustment of the mA and/or kV according to patient size and/or use of iterative reconstruction technique. CONTRAST:  146m OMNIPAQUE IOHEXOL 350 MG/ML SOLN COMPARISON:  CT abdomen and pelvis 09/09/2022 FINDINGS: VASCULAR Aorta: Atherosclerotic disease in the descending thoracic aorta and abdominal aorta without aneurysm, dissection or significant stenosis. Celiac: Patent without evidence of aneurysm, dissection, vasculitis or significant stenosis. 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 SMA: Patent without evidence of aneurysm, dissection, vasculitis or significant stenosis. Renals: Right renal artery is patent without significant stenosis. No evidence for aneurysm or dissection involving the right renal artery. High-grade stenosis and near occlusion of the left renal artery 7 mm distal to the origin. Post stenotic dilatation in the left renal artery. No evidence for aneurysm or dissection in left renal artery. IMA: Patent without evidence of aneurysm, dissection, vasculitis or significant stenosis. Inflow: Patent without evidence of aneurysm, dissection, vasculitis or significant stenosis. Proximal Outflow: Proximal femoral arteries are patent bilaterally. Veins: Hepatic veins and portal veins are patent. No acute abnormality involving the iliac veins or IVC. Renal veins are patent. Review of the MIP images confirms the above findings. NON-VASCULAR Lower chest: 4 mm calcified granuloma in the left lower lobe on sequence 8, image 18. Otherwise, the visualized lung bases are clear. No pleural effusions. Hepatobiliary: Cholecystectomy. No biliary dilatation. No discrete liver lesion. Pancreas: Unremarkable. No pancreatic ductal dilatation or surrounding inflammatory changes. Spleen: Normal in size without focal abnormality. Adrenals/Urinary Tract: Normal adrenal glands. Stable appearance of  both kidneys without stones, hydronephrosis or suspicious renal lesions. No ureter dilatation. Normal appearance of the urinary bladder. Stomach/Bowel: Normal appearance of the stomach. Multiple diverticula involving the sigmoid colon without acute inflammation. Again noted is a small very low density structure with peripheral calcifications in the transverse colon on sequence 6, image 151. This could represent a partially calcified lipoma. No evidence for bowel dilatation or obstruction. No focal bowel inflammation. Appendix is not confidently identified. Lymphatic: No lymph node enlargement in the abdomen or pelvis. Reproductive: Status post hysterectomy. No adnexal masses. Other: Negative for free fluid.  Negative for free air. Musculoskeletal: No acute bone abnormality. Again noted is bone cement in 2 lower thoracic vertebral bodies most likely representing T8 and T9. IMPRESSION: 1. No acute abnormality in the abdomen or pelvis. Specifically, no evidence for acute mesenteric ischemia. 2. 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 3. Colonic diverticulosis without acute inflammation. 4. Question a small colonic lipoma with calcifications as described. No acute inflammatory changes in this area. 5. Aortic Atherosclerosis (ICD10-I70.0). Electronically Signed   By: AMarkus DaftM.D.   On: 09/18/2022 15:41   CT ABDOMEN PELVIS W CONTRAST  Result Date: 09/09/2022 CLINICAL DATA:  71year old female with left lower quadrant pain. Recent CT Abdomen and Pelvis negative for diverticulitis. EXAM: CT ABDOMEN AND PELVIS WITH CONTRAST TECHNIQUE: Multidetector CT imaging of the abdomen and pelvis was performed using the standard protocol following bolus administration of intravenous contrast. RADIATION DOSE REDUCTION: This exam was performed according to the departmental dose-optimization program which includes automated  exposure control, adjustment of the mA and/or kV according to  patient size and/or use of iterative reconstruction technique. CONTRAST:  129m OMNIPAQUE IOHEXOL 300 MG/ML  SOLN COMPARISON:  CT Abdomen and Pelvis 09/06/2022 and earlier. FINDINGS: Lower chest: No pericardial or pleural effusion. Negative lung bases. Calcified aortic atherosclerosis. Hepatobiliary: Absent gallbladder.  Stable liver and biliary tree. Pancreas: Atrophied. Spleen: Negative. Adrenals/Urinary Tract: Stable, negative. Symmetric renal enhancement and contrast excretion. Unremarkable bladder. Stomach/Bowel: Stable distal large bowel. Diverticulosis throughout the sigmoid colon. No active inflammation identified. Mostly decompressed upstream descending colon. Retained stool in the transverse and right colon. Diminutive or absent appendix. No dilated small bowel. Decompressed stomach and duodenum. No free air or free fluid. No mesenteric inflammation identified. Vascular/Lymphatic: Aortoiliac calcified atherosclerosis. Normal caliber abdominal aorta. Major arterial structures in the abdomen and pelvis remain patent. Portal venous system is patent. No lymphadenopathy identified. Reproductive: Absent uterus.  Diminutive or absent ovaries. Other: No pelvic free fluid.  Chronic pelvic phleboliths. Musculoskeletal: Previously augmented lower thoracic vertebrae. Underlying osteopenia. No acute osseous abnormality identified. IMPRESSION: 1. No acute or inflammatory process identified in the abdomen or pelvis. 2. Chronic sigmoid diverticulosis, no active inflammation. 3. Aortic Atherosclerosis (ICD10-I70.0). Electronically Signed   By: HGenevie AnnM.D.   On: 09/09/2022 12:54   CT ABDOMEN PELVIS W CONTRAST  Result Date: 09/06/2022 CLINICAL DATA:  Abdominal pain, acute, nonlocalized hx of diverticulitis EXAM: CT ABDOMEN AND PELVIS WITH CONTRAST TECHNIQUE: Multidetector CT imaging of the abdomen and pelvis was performed using the standard protocol following bolus administration of intravenous contrast. RADIATION DOSE  REDUCTION: This exam was performed according to the departmental dose-optimization program which includes automated exposure control, adjustment of the mA and/or kV according to patient size and/or use of iterative reconstruction technique. CONTRAST:  1065mOMNIPAQUE IOHEXOL 300 MG/ML  SOLN COMPARISON:  CT abdomen pelvis 08/03/2022 FINDINGS: Lower chest: No acute abnormality. Hepatobiliary: No focal liver abnormality is seen. Prior cholecystectomy. Pancreas: Unremarkable. No pancreatic ductal dilatation or surrounding inflammatory changes. Spleen: Normal in size without focal abnormality. Adrenals/Urinary Tract: Adrenal glands are unremarkable. No hydronephrosis or nephrolithiasis. Moderate bladder distension. Stomach/Bowel: The stomach is within normal limits. There is no evidence of bowel obstruction.The appendix is surgically absent. Sigmoid diverticulosis. No acute diverticulitis. Vascular/Lymphatic: Aorto bi-iliac atherosclerosis no AAA. No lymphadenopathy. Reproductive: Prior hysterectomy. Other: No bowel containing hernia.  No ascites.  No free air. Musculoskeletal: Chronic compression deformities of T8 and T9 prior vertebroplasty. Mild multilevel degenerative changes of the spine. No acute osseous abnormality. IMPRESSION: No acute findings in the abdomen or pelvis. Sigmoid diverticulosis without evidence of acute diverticulitis. Prior cholecystectomy, appendectomy, and hysterectomy. Electronically Signed   By: JaMaurine Simmering.D.   On: 09/06/2022 13:29   MM 3D SCREEN BREAST BILATERAL  Result Date: 09/03/2022 CLINICAL DATA:  Screening. EXAM: DIGITAL SCREENING BILATERAL MAMMOGRAM WITH TOMOSYNTHESIS AND CAD TECHNIQUE: Bilateral screening digital craniocaudal and mediolateral oblique mammograms were obtained. Bilateral screening digital breast tomosynthesis was performed. The images were evaluated with computer-aided detection. COMPARISON:  Previous exam(s). ACR Breast Density Category c: The breast tissue is  heterogeneously dense, which may obscure small masses. FINDINGS: There are no findings suspicious for malignancy. IMPRESSION: No mammographic evidence of malignancy. A result letter of this screening mammogram will be mailed directly to the patient. RECOMMENDATION: Screening mammogram in one year. (Code:[id number]-B-01Y) BI-RADS CATEGORY  1: Negative. Electronically Signed   By: DaLajean Manes.D.   On: 09/03/2022 09:33    Lab Results: Personally reviewed CBC  Component Value Date/Time   WBC 9.9 09/23/2022 1105   RBC 4.23 09/23/2022 1105   HGB 10.8 (L) 09/23/2022 1105   HGB 12.4 09/18/2012 2000   HCT 34.8 (L) 09/23/2022 1105   HCT 37.3 09/18/2012 2000   PLT 313 09/23/2022 1105   PLT 450 (H) 09/18/2012 2000   MCV 82.3 09/23/2022 1105   MCV 86 09/18/2012 2000   MCH 25.5 (L) 09/23/2022 1105   MCHC 31.0 09/23/2022 1105   RDW 17.0 (H) 09/23/2022 1105   RDW 14.5 09/18/2012 2000   LYMPHSABS 1.9 09/21/2022 1509   MONOABS 0.4 09/21/2022 1509   EOSABS 0.0 09/21/2022 1509   BASOSABS 0.0 09/21/2022 1509    BMET    Component Value Date/Time   NA 140 09/23/2022 1105   NA 143 12/26/2018 0000   NA 139 09/18/2012 2000   K 3.2 (L) 09/23/2022 1105   K 3.3 (L) 09/18/2012 2000   CL 105 09/23/2022 1105   CL 99 09/18/2012 2000   CO2 27 09/23/2022 1105   CO2 33 (H) 09/18/2012 2000   GLUCOSE 114 (H) 09/23/2022 1105   GLUCOSE 111 (H) 09/18/2012 2000   BUN 10 09/23/2022 1105   BUN 16 12/26/2018 0000   BUN 13 09/18/2012 2000   CREATININE 1.00 09/23/2022 1105   CREATININE 0.72 09/18/2012 2000   CALCIUM 8.6 (L) 09/23/2022 1105   CALCIUM 9.4 09/18/2012 2000   GFRNONAA >60 09/23/2022 1105   GFRNONAA >60 09/18/2012 2000   GFRAA >60 09/02/2020 1658   GFRAA >60 09/18/2012 2000    BNP    Component Value Date/Time   BNP 77.7 08/08/2022 1113    ProBNP    Component Value Date/Time   PROBNP 180.0 (H) 08/29/2021 0931    Specialty Problems       Pulmonary Problems   Oropharyngeal dysphagia    COPD (chronic obstructive pulmonary disease) (HCC)   DOE (dyspnea on exertion)    Allergies  Allergen Reactions   Meperidine Hives and Anaphylaxis   Strawberry Extract Hives and Swelling   Sucralfate Nausea Only and Other (See Comments)    Severe nausea Other reaction(s): Other (See Comments) Severe nausea Severe nausea   Wasp Venom Hives    Immunization History  Administered Date(s) Administered   Fluad Quad(high Dose 65+) 09/30/2019, 08/29/2020, 08/28/2021   Influenza, High Dose Seasonal PF 08/23/2018   Influenza,inj,Quad PF,6+ Mos 02/24/2015   Moderna Sars-Covid-2 Vaccination 03/18/2020, 04/15/2020   Pneumococcal Conjugate-13 11/12/2018   Pneumococcal Polysaccharide-23 02/24/2015    Past Medical History:  Diagnosis Date   Allergy    Anemia    Anxiety    Arthritis    CHF (congestive heart failure) (HCC)    COPD (chronic obstructive pulmonary disease) (HCC)    Depression    GERD (gastroesophageal reflux disease)    Hypertension    MVP (mitral valve prolapse)    Parkinson disease     Tobacco History: Social History   Tobacco Use  Smoking Status Former   Packs/day: 1.00   Years: 15.00   Total pack years: 15.00   Types: Cigarettes   Quit date: 09/12/2017   Years since quitting: 5.0  Smokeless Tobacco Never   Counseling given: Not Answered   Continue to not smoke  Outpatient Encounter Medications as of 09/25/2022  Medication Sig   acetaminophen (TYLENOL) 325 MG tablet Take 2 tablets (650 mg total) by mouth every 4 (four) hours as needed for mild pain (or temp > 37.5 C (99.5 F)).   carbidopa-levodopa (SINEMET  IR) 25-100 MG tablet Take 2 tablets by mouth 3 (three) times daily.   escitalopram (LEXAPRO) 5 MG tablet Take 1 tablet (5 mg total) by mouth daily.   hydrOXYzine (ATARAX) 25 MG tablet Take 1 tablet (25 mg total) by mouth 2 (two) times daily as needed for anxiety or nausea.   ibuprofen (ADVIL) 400 MG tablet Take 1 tablet (400 mg total) by mouth every 8  (eight) hours as needed for headache or moderate pain.   metoCLOPramide (REGLAN) 10 MG tablet Take 1 tablet (10 mg total) by mouth 3 (three) times daily with meals.   ondansetron (ZOFRAN-ODT) 4 MG disintegrating tablet Take 1 tablet (4 mg total) by mouth every 8 (eight) hours as needed for nausea or vomiting.   oxyCODONE (ROXICODONE) 5 MG immediate release tablet Take 1 tablet (5 mg total) by mouth every 8 (eight) hours as needed for moderate pain.   OXYGEN Inhale 3 L/min into the lungs See admin instructions. 3 L/min at bedtime and as needed for shortness of breath throughout the day   promethazine (PHENERGAN) 12.5 MG tablet Take 1 tablet (12.5 mg total) by mouth every 12 (twelve) hours as needed for nausea or vomiting.   topiramate (TOPAMAX) 50 MG tablet Take 1 tablet (50 mg total) by mouth at bedtime.   No facility-administered encounter medications on file as of 09/25/2022.     Review of Systems  Review of Systems  N/a Physical Exam  BP 132/68 (BP Location: Left Arm, Patient Position: Sitting, Cuff Size: Normal)   Pulse (!) 53   Temp 98.6 F (37 C) (Oral)   Wt 180 lb 6.4 oz (81.8 kg)   SpO2 99%   BMI 23.80 kg/m   Wt Readings from Last 5 Encounters:  09/25/22 180 lb 6.4 oz (81.8 kg)  09/23/22 174 lb (78.9 kg)  09/21/22 176 lb (79.8 kg)  09/21/22 176 lb 4 oz (79.9 kg)  09/18/22 182 lb 15.7 oz (83 kg)    BMI Readings from Last 5 Encounters:  09/25/22 23.80 kg/m  09/23/22 22.96 kg/m  09/21/22 23.22 kg/m  09/21/22 23.25 kg/m  09/18/22 24.14 kg/m     Physical Exam General: Sitting in chair, no acute distress Eyes: EOMI, icterus Neck: Supple, no JVP Pulmonary: Distant, clear Abdomen: Nondistended, bowel sounds present CV: Regular rate and rhythm, no murmur MSK: No synovitis, joint effusion Neuro: Normal gait, no weakness Psych: Normal mood, full affect   Assessment & Plan:    Dyspnea on exertion: Overall stable.  Not responding well to inhalers.  Not  taking any.  PFTs without fixed obstruction or bronchodilator response.  Did show some signs of restriction with reduced DLCO.  High-res CT scan 07/2022 reveals mild upper lobe interlobular septal thickening, otherwise clear.  Pattern not clear but not consistent with UIP.  NSIP versus HP versus postinfectious scarring.  Chronic hypoxemic respiratory failure: Using at night.  Now some during the day.  Encouraged her to check her oxygen saturation to maintain 88%.  Notably no clear reason on recent high-res CT scan of the chest for hypoxemia in regards to lung parenchyma.  Mild interstitial findings in the upper lobes do not account for this.  Abnormal CT scan: Mild interstitial changes upper lobes.  Not UIP.  NSIP versus HSP?  Versus possible postinfectious scarring.  Plan repeat follow-up scan in 1 year, 07/2023 or sooner as needed based on symptoms.   Return in about 4 months (around 01/26/2023).   Lanier Clam, MD 09/25/2022  I  spent 42 minutes in the care of the patient including extensive review of recent hospitalization and ED records, face-to-face visit, coordination of care.

## 2022-09-25 NOTE — Patient Instructions (Signed)
Nice to see you again  No changes to your medications  Your CT scan showed very mild changes in the top of the lungs we should keep an eye on over time, we can discuss further in the future.  Return to clinic in 4 months or sooner as needed with Dr. Silas Flood

## 2022-09-26 DIAGNOSIS — I5032 Chronic diastolic (congestive) heart failure: Secondary | ICD-10-CM | POA: Diagnosis not present

## 2022-09-26 DIAGNOSIS — E782 Mixed hyperlipidemia: Secondary | ICD-10-CM | POA: Diagnosis not present

## 2022-09-26 DIAGNOSIS — I7789 Other specified disorders of arteries and arterioles: Secondary | ICD-10-CM | POA: Diagnosis not present

## 2022-09-26 DIAGNOSIS — I5033 Acute on chronic diastolic (congestive) heart failure: Secondary | ICD-10-CM | POA: Diagnosis not present

## 2022-09-26 DIAGNOSIS — N1832 Chronic kidney disease, stage 3b: Secondary | ICD-10-CM | POA: Diagnosis not present

## 2022-09-26 DIAGNOSIS — I10 Essential (primary) hypertension: Secondary | ICD-10-CM | POA: Diagnosis not present

## 2022-09-26 DIAGNOSIS — I509 Heart failure, unspecified: Secondary | ICD-10-CM | POA: Diagnosis not present

## 2022-09-26 DIAGNOSIS — R55 Syncope and collapse: Secondary | ICD-10-CM | POA: Diagnosis not present

## 2022-09-26 DIAGNOSIS — J449 Chronic obstructive pulmonary disease, unspecified: Secondary | ICD-10-CM | POA: Diagnosis not present

## 2022-09-27 ENCOUNTER — Encounter: Payer: Self-pay | Admitting: Family

## 2022-09-27 ENCOUNTER — Ambulatory Visit (INDEPENDENT_AMBULATORY_CARE_PROVIDER_SITE_OTHER): Payer: Medicare HMO | Admitting: Family

## 2022-09-27 ENCOUNTER — Telehealth: Payer: Self-pay | Admitting: Family

## 2022-09-27 VITALS — BP 138/72 | HR 78 | Temp 97.5°F | Ht 73.0 in | Wt 186.0 lb

## 2022-09-27 DIAGNOSIS — K3184 Gastroparesis: Secondary | ICD-10-CM

## 2022-09-27 DIAGNOSIS — N280 Ischemia and infarction of kidney: Secondary | ICD-10-CM | POA: Diagnosis not present

## 2022-09-27 MED ORDER — ERYTHROMYCIN BASE 250 MG PO TABS: ORAL_TABLET | ORAL | 0 refills | Status: DC

## 2022-09-27 NOTE — Assessment & Plan Note (Signed)
Suspected gastroparesis, not unexpected with parkinsons diagnosis and with some mild relief with reglan, although not completely. Will try trial erythromycin three times daily prior to meals. Advised pt to follow gastroparesis friendly diet, low fat low fiber, small frequent meals every 2-3 hours. Going to call GI as well to see if we can get an appintment more urgently as symptoms are not regressing.

## 2022-09-27 NOTE — Progress Notes (Signed)
Established Patient Office Visit  Subjective:  Patient ID: Morgan [name], female    DOB: [birth year]  Age: 71 y.o. MRN: [id number]  CC:  Chief Complaint  Patient presents with   Abdominal Pain    Pain across lower abdomin, requesting pain medication. Requesting Canal[place] surgery site. Pt states that's she is depressed and tired of hurting.    HPI Morgan [name] is here today with concerns.   Still ongoing pain, tired of feeling in pain at this time. Has taken so much imodium that now she is constipated. At this time she has not had a bowel movement for the last five days. Stopped taking immodium two days ago. Has had constipation. Before. Has not had any pain medication now for a few weeks. Has not eaten anything today, last night was able to eat some chicken noodle soup and crackers.   Abdominal pain still the same. No changes.   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 They will evaluate this further, unsure if this could be contributing to her current symptoms.   Saw pulmonologist 10/10 for workup of DOE however evaluation deferred until further workup with vascular. Has f/u with them in four months.   Gi symptoms, reached out to Gi dr Haig Prophet 9/15 a month ago who suspected that this was due to the parkinsons medication , however she did f/u with her neurologist, Dr. Manuella Ghazi who stated unlikely. Did a trial of reducing the medication and no change in symptoms.   Did take reglan but ran out, was from the Er. Did help slightly with her GI symptoms.   Still with zofran for nausea and phenergan 12.5 mg prn.   When she tries to eat she typically will vomit up the food, hard for her to keep anything down.  Abdominal pain still constant in her lower abdomen.  Went to Er 10/6 and 10/8 for similar findings. CT 10/3 with small colonic lipoma with calcifications with no acute inflammatory changes  per radiologist.    Low potassium: I do see h/o potassium in ER however not recently prescribed otc however pt does state she is taking this daily. Will verify at home and let me know.   Past Medical History:  Diagnosis Date   Allergy    Anemia    Anxiety    Arthritis    CHF (congestive heart failure) (HCC)    COPD (chronic obstructive pulmonary disease) (HCC)    Depression    GERD (gastroesophageal reflux disease)    Hypertension    MVP (mitral valve prolapse)    Parkinson disease     Past Surgical History:  Procedure Laterality Date   ABDOMINAL HYSTERECTOMY     APPENDECTOMY     BALLOON DILATION N/A 04/21/2020   Procedure: BALLOON DILATION;  Surgeon: Milus Banister, MD;  Location: [facility];  Service: Endoscopy;  Laterality: N/A;  pyloric/ GI   BALLOON DILATION N/A 07/04/2020   Procedure: BALLOON DILATION;  Surgeon: Mauri Pole, MD;  Location: [facility];  Service: Endoscopy;  Laterality: N/A;   BIOPSY  04/21/2020   Procedure: BIOPSY;  Surgeon: Milus Banister, MD;  Location: [facility];  Service: Endoscopy;;   BIOPSY  07/04/2020   Procedure: BIOPSY;  Surgeon: Mauri Pole, MD;  [facility]: [facility];  Service: Endoscopy;;  EGD and COLON   BREAST SURGERY Right 1988   tumor removal  CHOLECYSTECTOMY N/A 05/18/2020   Procedure: LAPAROSCOPIC CHOLECYSTECTOMY;  Surgeon: Johnathan Hausen, MD;  Location: [facility];  Service: General;  Laterality: N/A;   COLONOSCOPY WITH PROPOFOL N/A 07/04/2020   Procedure: COLONOSCOPY WITH PROPOFOL;  Surgeon: Mauri Pole, MD;  [facility];  Service: Endoscopy;  Laterality: N/A;   COLONOSCOPY WITH PROPOFOL N/A 11/03/2021   Procedure: COLONOSCOPY WITH PROPOFOL;  Surgeon: Lesly Rubenstein, MD;  Location: [facility];  Service: Endoscopy;  Laterality: N/A;   ESOPHAGEAL DILATION  08/11/2020   Procedure: PYLORIC DILATION;  Surgeon: Doran Stabler, MD;  [facility];  Service: Gastroenterology;;    ESOPHAGOGASTRODUODENOSCOPY (EGD) WITH PROPOFOL N/A 04/21/2020   Procedure: ESOPHAGOGASTRODUODENOSCOPY (EGD) WITH PROPOFOL;  Surgeon: Milus Banister, MD;  [facility];  Service: Endoscopy;  Laterality: N/A;   ESOPHAGOGASTRODUODENOSCOPY (EGD) WITH PROPOFOL N/A 07/04/2020   Procedure: ESOPHAGOGASTRODUODENOSCOPY (EGD) WITH PROPOFOL;  Surgeon: Mauri Pole, MD;  [facility];  Service: Endoscopy;  Laterality: N/A;   ESOPHAGOGASTRODUODENOSCOPY (EGD) WITH PROPOFOL N/A 08/11/2020   Procedure: ESOPHAGOGASTRODUODENOSCOPY (EGD) WITH PROPOFOL;  Surgeon: Doran Stabler, MD;  [facility];  Service: Gastroenterology;  Laterality: N/A;   ESOPHAGOGASTRODUODENOSCOPY (EGD) WITH PROPOFOL N/A 11/03/2021   Procedure: ESOPHAGOGASTRODUODENOSCOPY (EGD) WITH PROPOFOL;  Surgeon: Lesly Rubenstein, MD;  Location: [facility];  Service: Endoscopy;  Laterality: N/A;   ESOPHAGOGASTRODUODENOSCOPY (EGD) WITH PROPOFOL N/A 05/07/2022   Procedure: ESOPHAGOGASTRODUODENOSCOPY (EGD) WITH PROPOFOL;  Surgeon: Lin Landsman, MD;  Location: [facility];  Service: Gastroenterology;  Laterality: N/A;   KNEE SURGERY  2005   2 times left   KNEE SURGERY Right    KYPHOPLASTY N/A 07/20/2021   Procedure: T 10KYPHOPLASTY;  Surgeon: Hessie Knows, MD;  Location: [facility];  Service: Orthopedics;  Laterality: N/A;   KYPHOPLASTY N/A 08/23/2021   Procedure: T9 KYPHOPLASTY;  Surgeon: Hessie Knows, MD;  Location: [facility];  Service: Orthopedics;  Laterality: N/A;   POLYPECTOMY  07/04/2020   Procedure: POLYPECTOMY;  Surgeon: Mauri Pole, MD;  [facility];  Service: Endoscopy;;   TUBAL LIGATION      Family History  Problem Relation Age of Onset   Breast cancer Mother 58   Ovarian cancer Mother 29   Brain cancer Mother    Hypertension Father    Hyperlipidemia Father    Heart disease Father    Colon cancer Father        diagnosed in his 76s   Breast cancer Maternal Grandmother  66   Diabetes Paternal Grandmother    Breast cancer Paternal Grandmother 70   Other Son        drug over dose   Colon cancer Maternal Grandfather        dx in his 71s   Liver disease Maternal Grandfather    Breast cancer Maternal Aunt 60   Breast cancer Maternal Aunt 60   Colon cancer Maternal Uncle    Stomach cancer Neg Hx    Pancreatic cancer Neg Hx    Esophageal cancer Neg Hx     Social History   Socioeconomic History   Marital status: Single    Spouse name: Not on file   Number of children: 2   Years of education: Not on file   Highest education level: Not on file  Occupational History   Not on file  Tobacco Use   Smoking status: Former    Packs/day: 1.00    Years: 15.00    Total pack years: 15.00  Types: Cigarettes    Quit date: 09/12/2017    Years since quitting: 5.0   Smokeless tobacco: Never  Vaping Use   Vaping Use: Never used  Substance and Sexual Activity   Alcohol use: Not Currently   Drug use: No   Sexual activity: Not Currently  Other Topics Concern   Not on file  Social History Narrative   Lives with Grandson's great grandparents    [name] - 109 years old ([place])   Daughter - [name] - lives in the [place] system - family, aunt, [name] and [name] (who she lives with), brother   Transportation: roommates   Enjoy: [employer] channel, playing with grandson   Exercise: PT exercises   Diet: good - chicken, some steak, tries to eat healthy, avoids fried foods   Retired from being a Quarry manager   Social Determinants of Radio broadcast assistant Strain: [place]  (11/12/2018)   Overall Financial Resource Strain (CARDIA)    Difficulty of Paying Living Expenses: Not hard at all  Food Insecurity: No Food Insecurity (09/09/2022)   Hunger Vital Sign    Worried About Running Out of Food in the Last Year: Never true    [place] in the Last Year: Never true  Transportation Needs: No Transportation Needs (09/09/2022)   PRAPARE - Armed forces logistics/support/administrative officer (Medical): No    Lack of Transportation (Non-Medical): No  Physical Activity: Not on file  Stress: Not on file  Social Connections: Not on file  Intimate Partner Violence: Not At Risk (09/09/2022)   Humiliation, Afraid, Rape, and Kick questionnaire    Fear of Current or Ex-Partner: No    Emotionally Abused: No    Physically Abused: No    Sexually Abused: No    Outpatient Medications Prior to Visit  Medication Sig Dispense Refill   acetaminophen (TYLENOL) 325 MG tablet Take 2 tablets (650 mg total) by mouth every 4 (four) hours as needed for mild pain (or temp > 37.5 C (99.5 F)).     carbidopa-levodopa (SINEMET IR) 25-100 MG tablet Take 2 tablets by mouth 3 (three) times daily.     hydrOXYzine (ATARAX) 25 MG tablet Take 1 tablet (25 mg total) by mouth 2 (two) times daily as needed for anxiety or nausea. 30 tablet 0   ibuprofen (ADVIL) 400 MG tablet Take 1 tablet (400 mg total) by mouth every 8 (eight) hours as needed for headache or moderate pain. 20 tablet 0   ondansetron (ZOFRAN-ODT) 4 MG disintegrating tablet Take 1 tablet (4 mg total) by mouth every 8 (eight) hours as needed for nausea or vomiting. 20 tablet 0   oxyCODONE (ROXICODONE) 5 MG immediate release tablet Take 1 tablet (5 mg total) by mouth every 8 (eight) hours as needed for moderate pain. 6 tablet 0   OXYGEN Inhale 3 L/min into the lungs See admin instructions. 3 L/min at bedtime and as needed for shortness of breath throughout the day     promethazine (PHENERGAN) 12.5 MG tablet Take 1 tablet (12.5 mg total) by mouth every 12 (twelve) hours as needed for nausea or vomiting. 20 tablet 0   topiramate (TOPAMAX) 50 MG tablet Take 1 tablet (50 mg total) by mouth at bedtime. 90 tablet 0   escitalopram (LEXAPRO) 5 MG tablet Take 1 tablet (5 mg total) by mouth daily. 30 tablet 1   metoCLOPramide (REGLAN) 10 MG tablet Take 1 tablet (10 mg total) by mouth 3 (three) times  daily with meals. 90 tablet 1   No  facility-administered medications prior to visit.    Allergies  Allergen Reactions   Meperidine Hives and Anaphylaxis   Strawberry Extract Hives and Swelling   Sucralfate Nausea Only and Other (See Comments)    Severe nausea Other reaction(s): Other (See Comments) Severe nausea Severe nausea   Wasp Venom Hives        Objective:    Physical Exam Constitutional:      General: She is not in acute distress.    Appearance: She is ill-appearing.  Cardiovascular:     Rate and Rhythm: Normal rate and regular rhythm.  Abdominal:     General: Bowel sounds are decreased.     Palpations: Abdomen is soft.     Tenderness: There is abdominal tenderness in the right lower quadrant, suprapubic area and left lower quadrant.  Neurological:     General: No focal deficit present.     Mental Status: She is alert and oriented to person, place, and time.  Psychiatric:        Mood and Affect: Mood is depressed (frustrated over health).     BP 138/72   Pulse 78   Temp (!) 97.5 F (36.4 C) (Temporal)   Ht '6\' 1"'$  (1.854 m)   Wt 186 lb (84.4 kg)   SpO2 100%   BMI 24.54 kg/m  Wt Readings from Last 3 Encounters:  09/27/22 186 lb (84.4 kg)  09/25/22 180 lb 6.4 oz (81.8 kg)  09/23/22 174 lb (78.9 kg)     Health Maintenance Due  Topic Date Due   TETANUS/TDAP  Never done   Zoster Vaccines- Shingrix (1 of 2) Never done   Pneumonia Vaccine 16+ Years old (3 - PPSV23 or PCV20) 02/24/2020   COVID-19 Vaccine (3 - Moderna risk series) 05/13/2020    There are no preventive care reminders to display for this patient.  Lab Results  Component Value Date   TSH 1.377 08/03/2022   Lab Results  Component Value Date   WBC 9.9 09/23/2022   HGB 10.8 (L) 09/23/2022   HCT 34.8 (L) 09/23/2022   MCV 82.3 09/23/2022   PLT 313 09/23/2022   Lab Results  Component Value Date   NA 140 09/23/2022   K 3.2 (L) 09/23/2022   CO2 27 09/23/2022   GLUCOSE 114 (H) 09/23/2022   BUN 10 09/23/2022    CREATININE 1.00 09/23/2022   BILITOT 0.5 09/23/2022   ALKPHOS 146 (H) 09/23/2022   AST 16 09/23/2022   ALT 6 09/23/2022   PROT 6.2 (L) 09/23/2022   ALBUMIN 3.1 (L) 09/23/2022   CALCIUM 8.6 (L) 09/23/2022   ANIONGAP 8 09/23/2022   GFR 70.01 09/06/2022   Lab Results  Component Value Date   HGBA1C 6.2 04/27/2022      Assessment & Plan:   Problem List Items Addressed This Visit       Cardiovascular and Mediastinum   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 They will eval/treat         Digestive   Gastroparesis - Primary    Suspected gastroparesis, not unexpected with parkinsons diagnosis and with some mild relief with reglan, although not completely. Will try trial erythromycin three times daily prior to meals. Advised pt to follow gastroparesis friendly diet, low fat low fiber, small frequent meals every 2-3 hours. Going to call  GI as well to see if we can get an appintment more urgently as symptoms are not regressing.        Relevant Medications   erythromycin (E-MYCIN) 250 MG tablet    Meds ordered this encounter  Medications   erythromycin (E-MYCIN) 250 MG tablet    Sig: Take one tablet three times daily prior to meals for max four weeks    Dispense:  90 tablet    Refill:  0    Order Specific Question:   Supervising Provider    Answer:   Diona Browner, AMY E [[id number]]    Follow-up: Return in about 2 weeks (around 10/11/2022) for f/u GI problems .    Eugenia Pancoast, FNP

## 2022-09-27 NOTE — Patient Instructions (Addendum)
------------------------------------   [phone]  Call [facility] to get an appointment scheduled for renal artery occlusion.   ------------------------------------ Trial erythromycin 250 mg three times daily, take prior to meals.  Can only take this for max of four weeks.    ------------------------------------

## 2022-09-27 NOTE — Telephone Encounter (Signed)
Pt called stating she was told by Dugal to call back after appt on 09/27/22, to confirm what kind of meds was she taking. Pt stated its Klor-con m20. Call back # [phone]

## 2022-09-27 NOTE — Assessment & Plan Note (Signed)
Pt advised to call vascular surgeon to make appt as I have already spoken to office and relayed my concerns with pt. They will eval/treat

## 2022-09-29 IMAGING — DX DG RIBS W/ CHEST 3+V*R*
3 series · 3 of 3 positions shown · non-contrast
Comparison: February 20, 2021

CLINICAL DATA: Pain following fall

EXAM:
RIGHT RIBS AND CHEST - 3+ VIEW

[hemithorax (ribs) pa (1 of 2)]
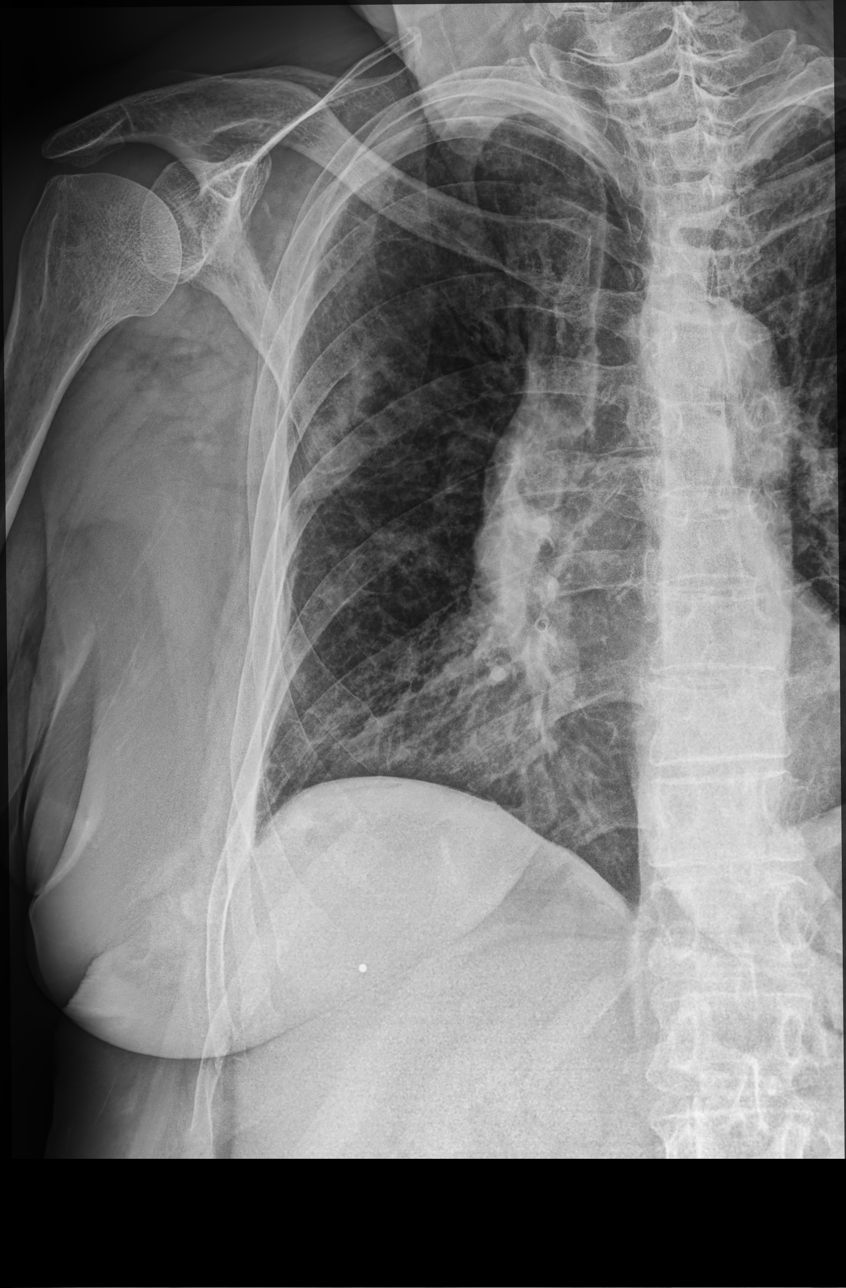

[hemithorax (ribs) pa (2 of 2)]
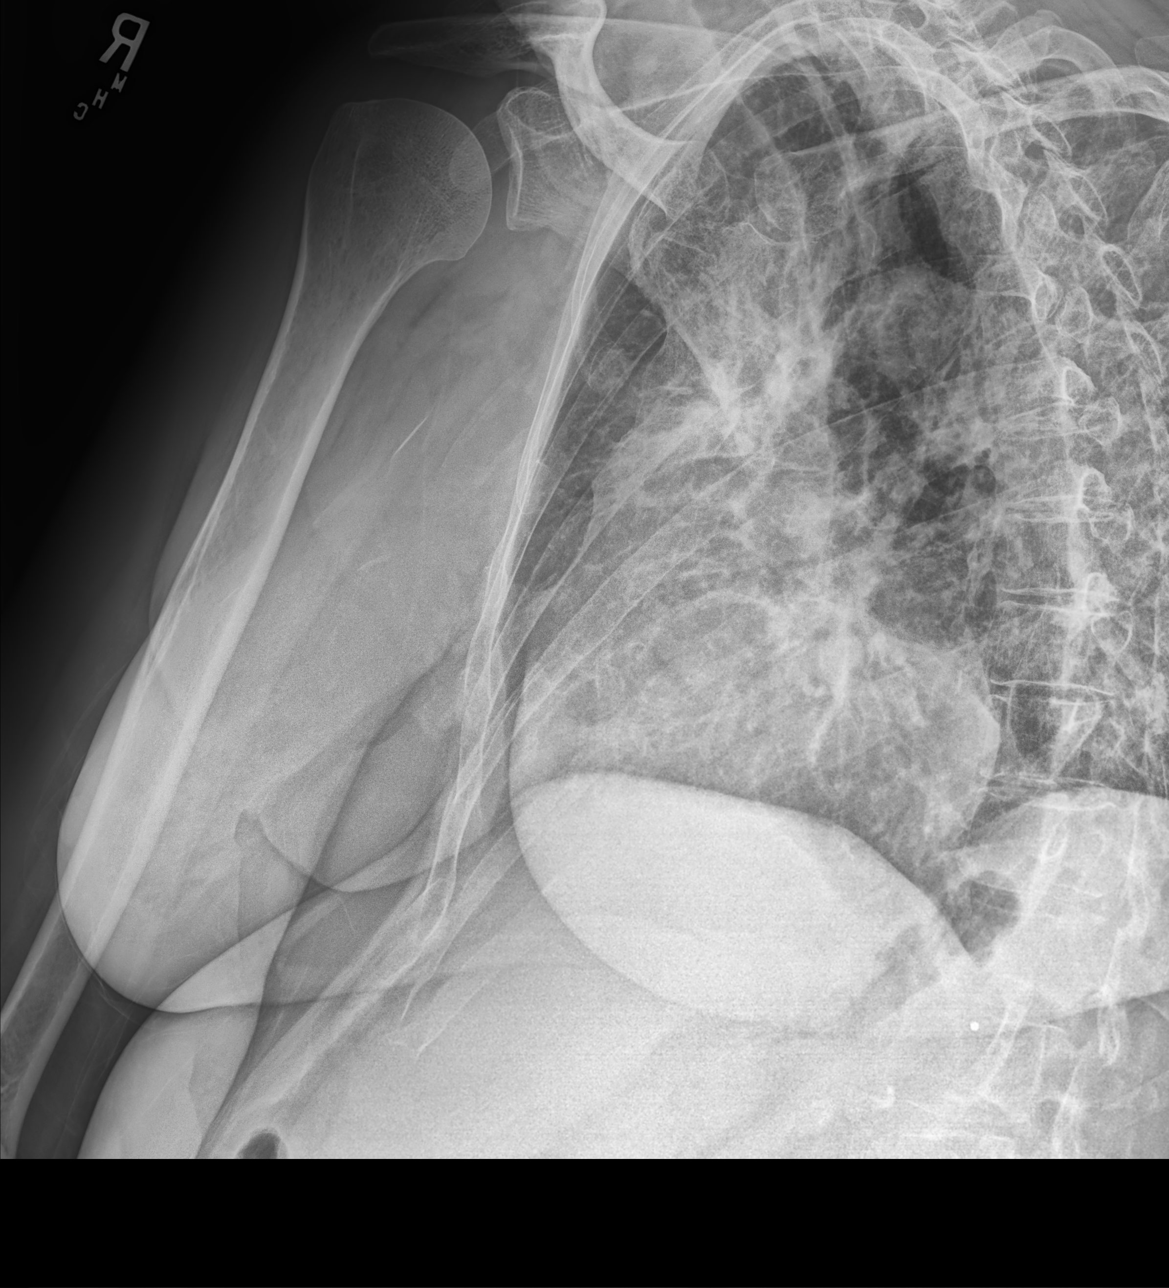

[chest pa]
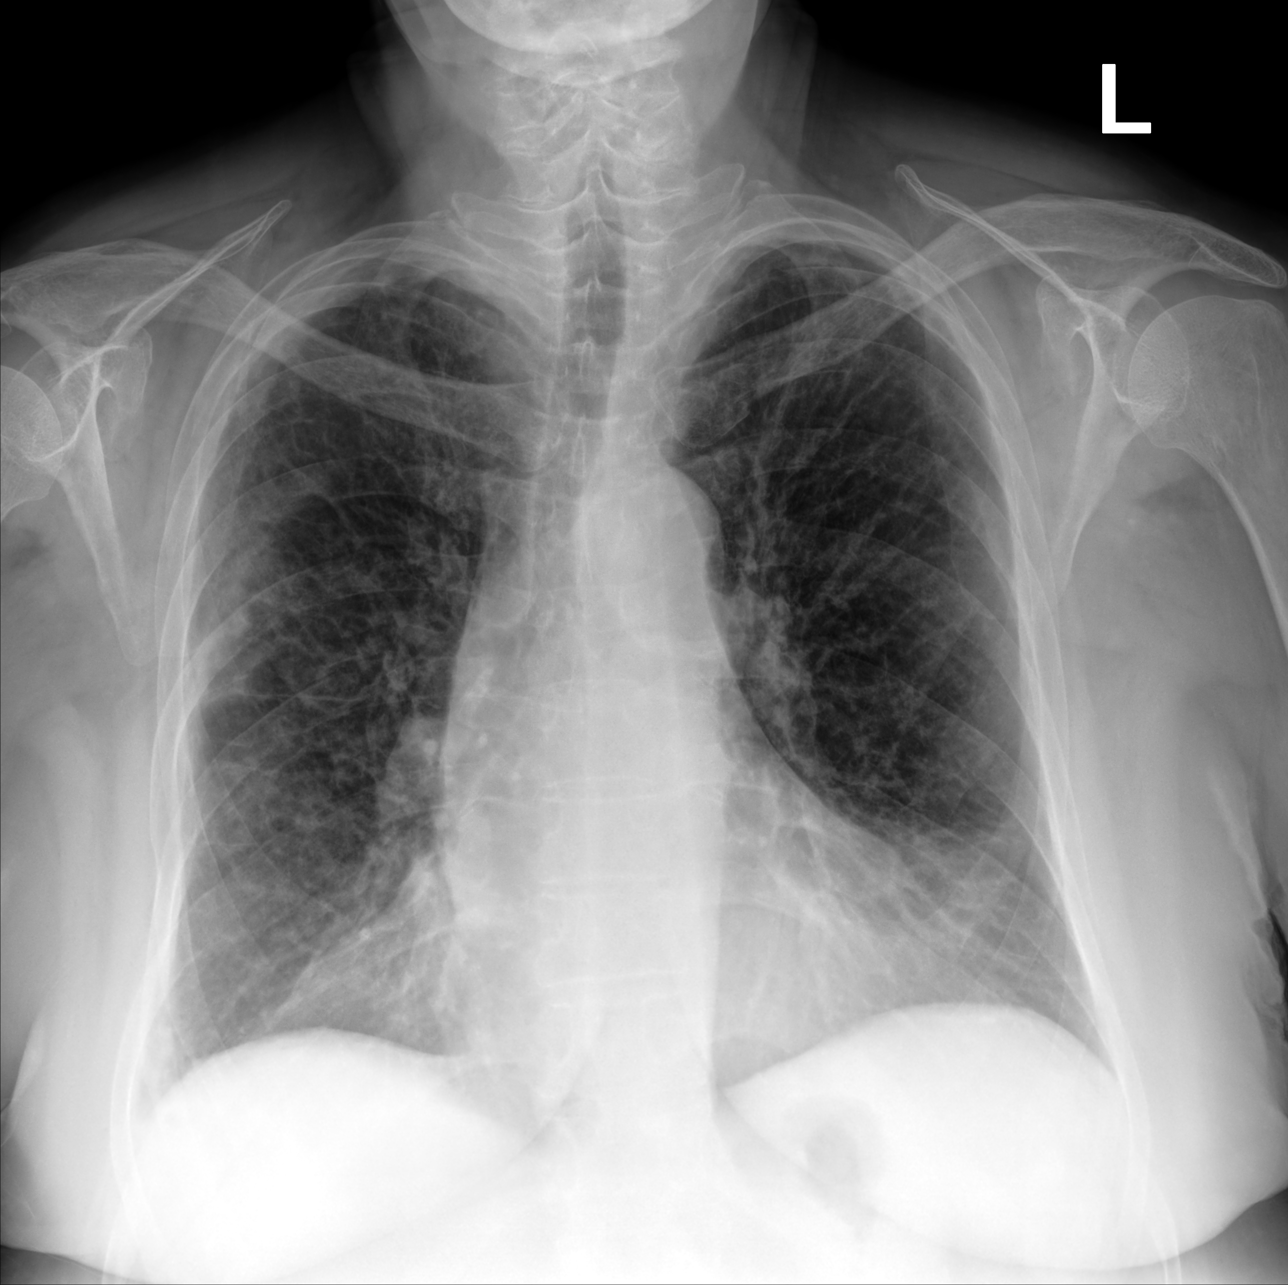

[3 of 3 positions shown; findings below may reference images not displayed]

FINDINGS: Frontal chest as well as oblique and cone-down rib images were
obtained. There is ill-defined opacity in the periphery of the right
upper lobe. The lungs elsewhere are clear. Heart size and pulmonary
vascularity are normal. No evident adenopathy.

No appreciable pneumothorax or pleural effusion. No evident rib
fracture.
IMPRESSION: No rib fracture appreciable. No pneumothorax. Suspected pneumonia
periphery of right upper lobe.

These results will be called to the ordering clinician or
representative by the Radiologist Assistant, and communication
documented in the PACS or [REDACTED].

## 2022-09-29 IMAGING — DX DG HAND COMPLETE 3+V*R*
3 series · 3 of 3 positions shown · non-contrast
Comparison: Right wrist radiographs December 20, 2017

CLINICAL DATA: Pain following fall

EXAM:
RIGHT HAND - COMPLETE 3+ VIEW

[hand pa]
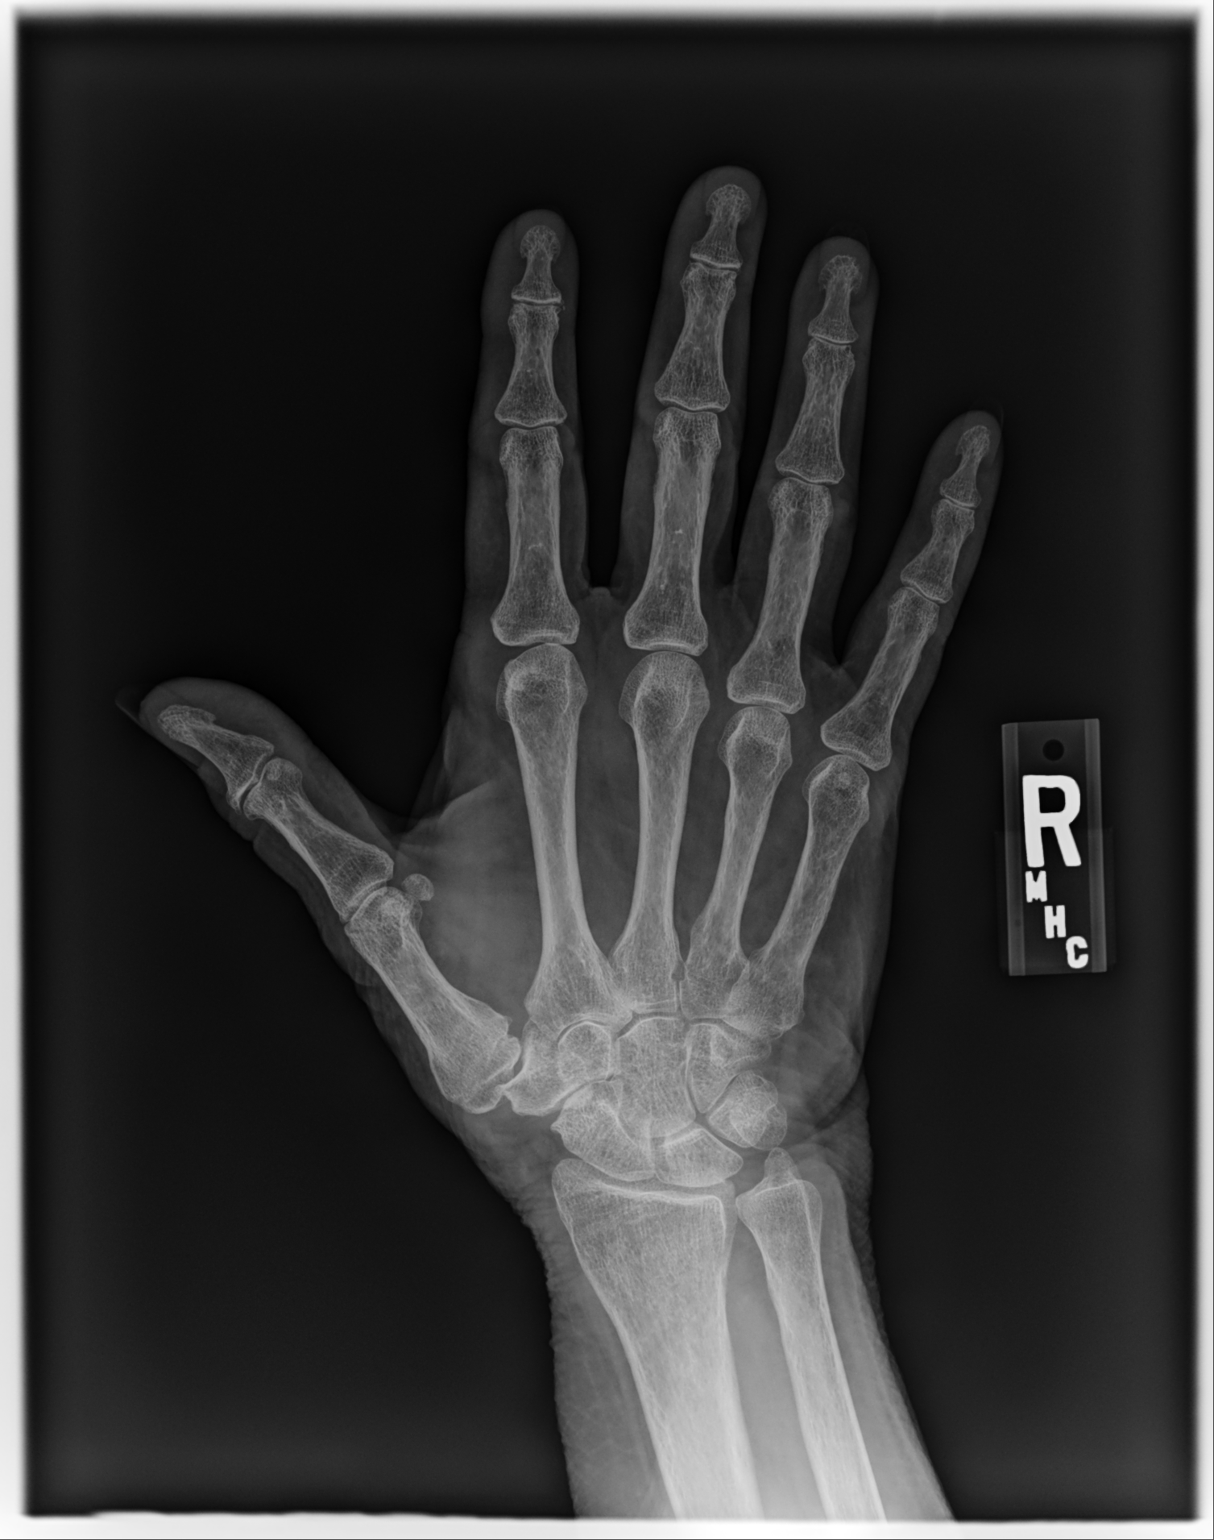

[hand mlo]
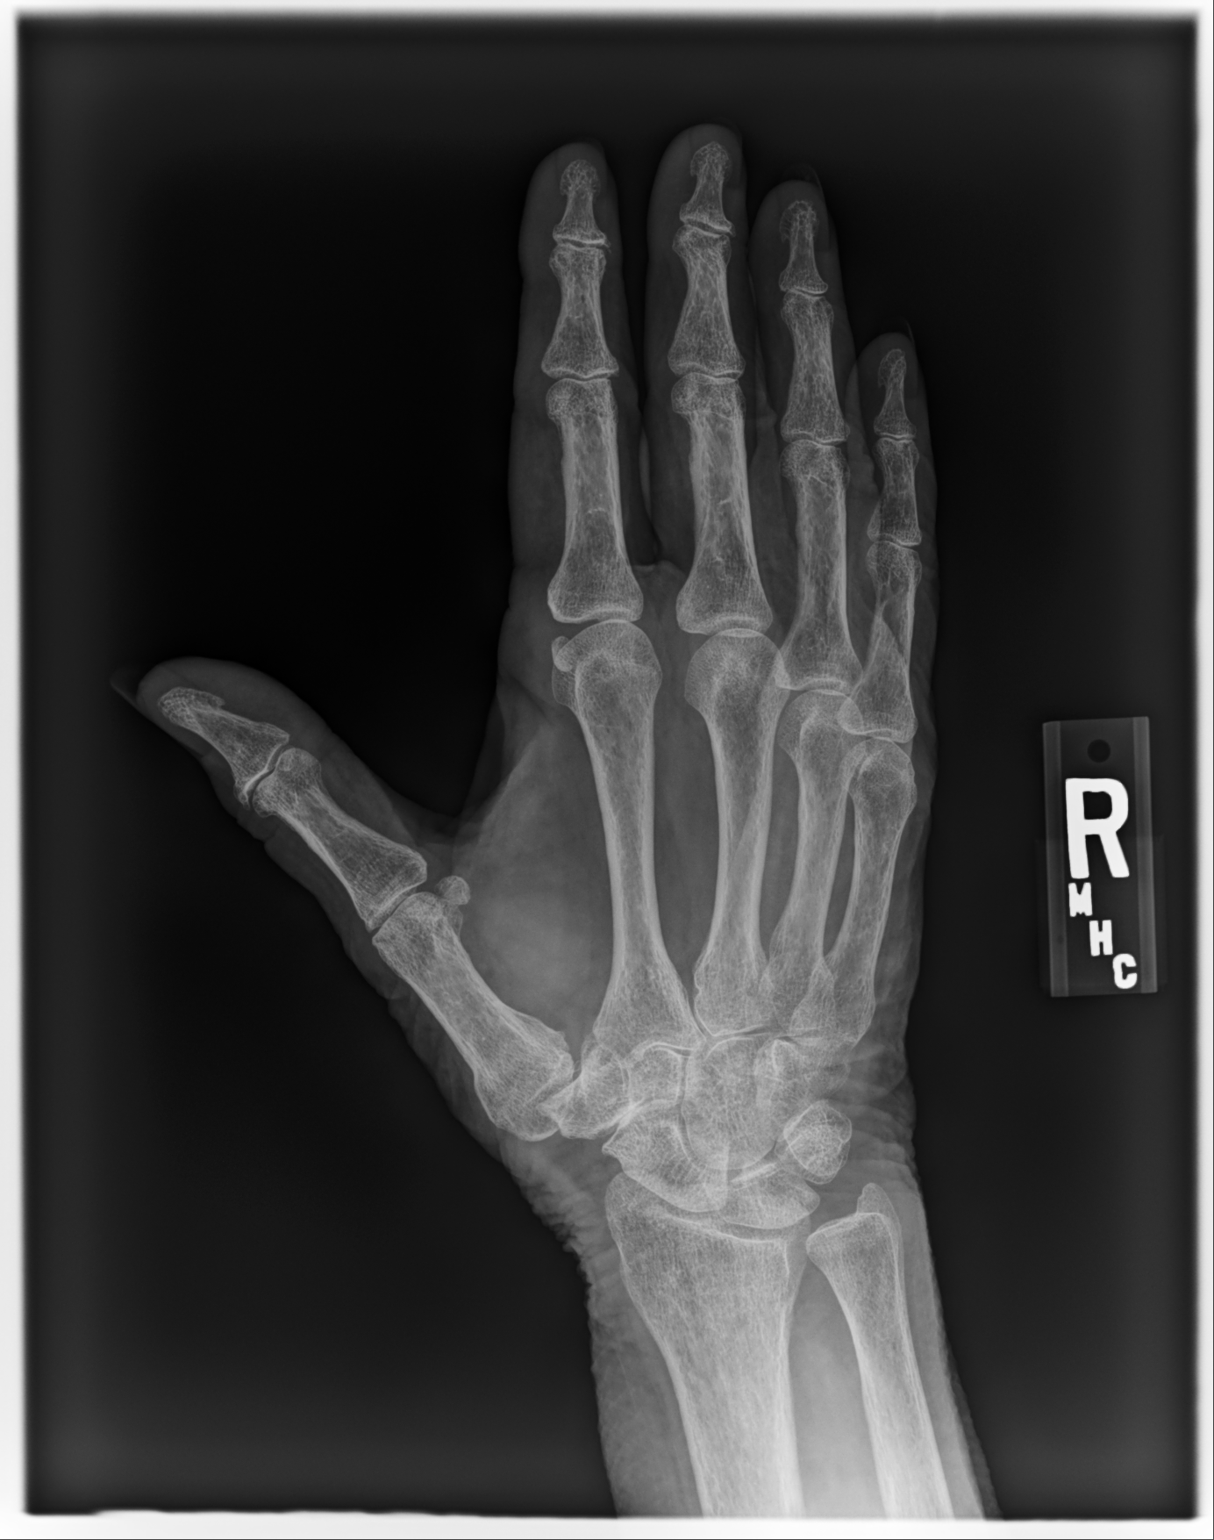

[hand lat]
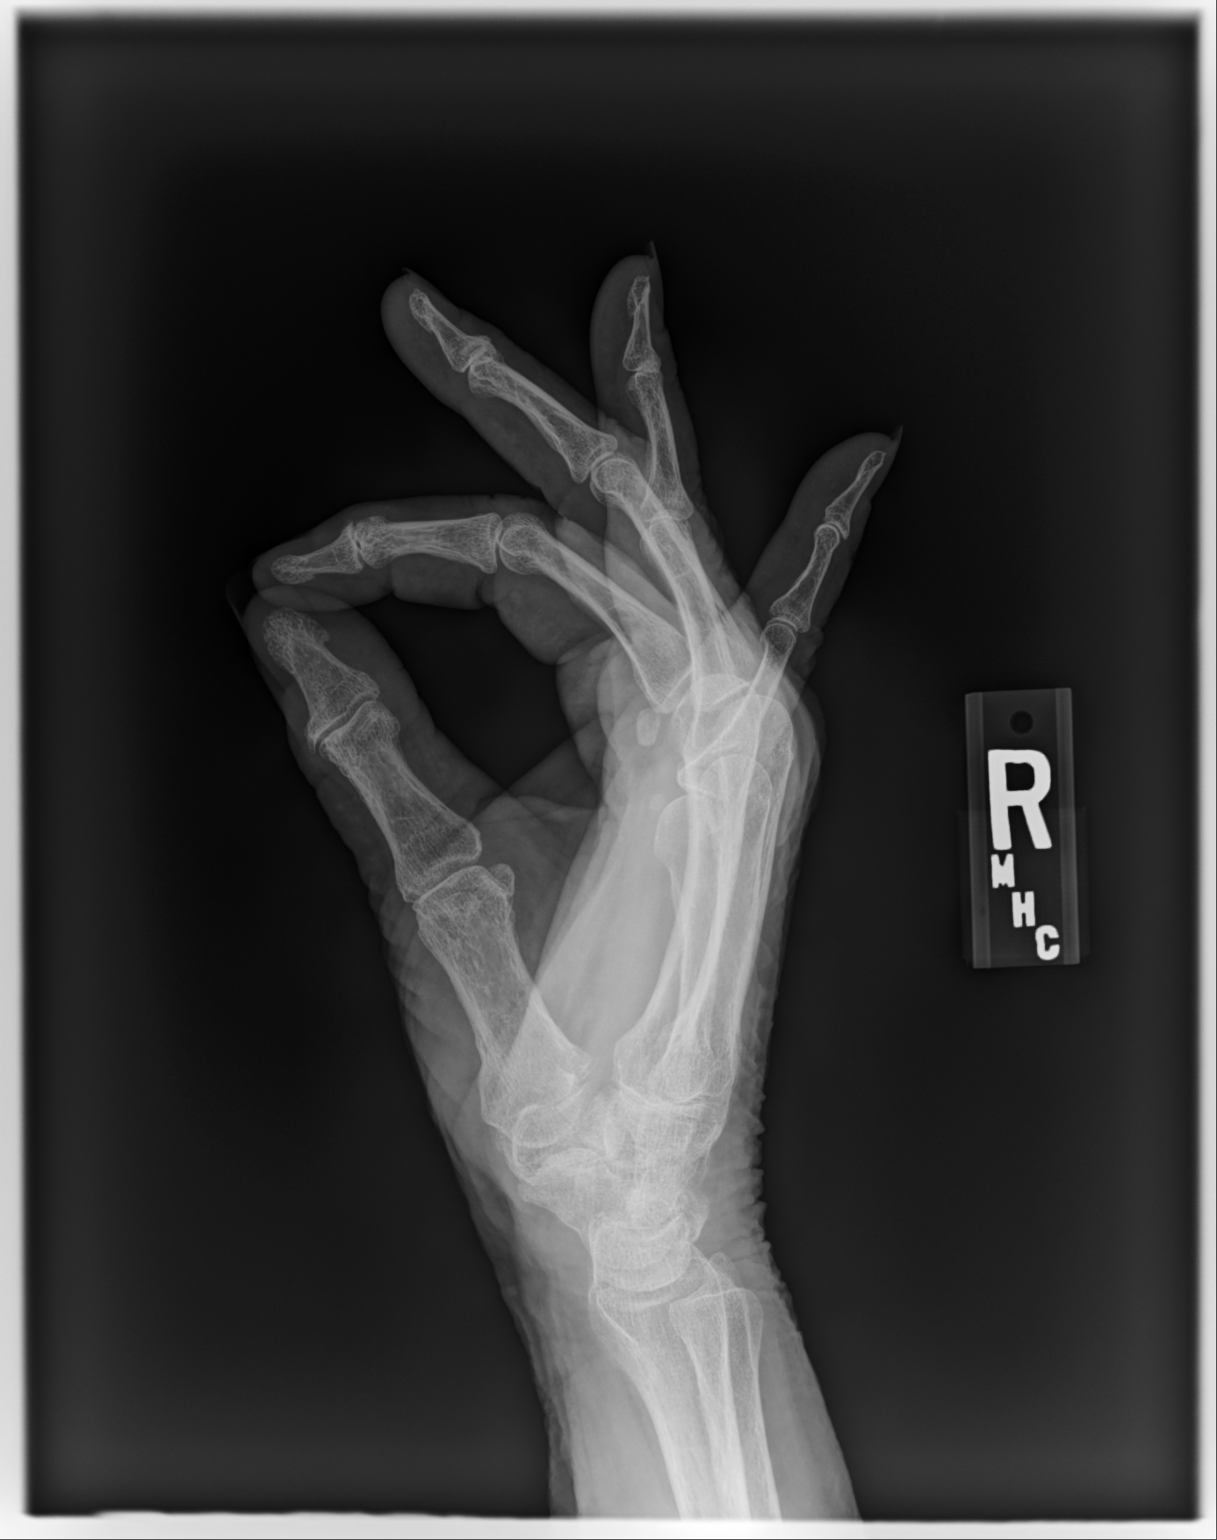

[3 of 3 positions shown; findings below may reference images not displayed]

FINDINGS: Frontal, oblique, and lateral views obtained. Old healed fracture of
the proximal first metacarpal with remodeling noted. No acute
fracture or dislocation. Mild spurring in the first IP and second
DIP joints. No appreciable joint space narrowing. No erosions.
IMPRESSION: No acute fracture or dislocation. Old trauma with remodeling
proximal first metacarpal. Mild osteoarthritic change in the first
IP and second DIP joints.

## 2022-10-01 ENCOUNTER — Telehealth: Payer: Self-pay | Admitting: *Deleted

## 2022-10-01 NOTE — Telephone Encounter (Signed)
        Patient  visited [facility] on 09/24/2022  for nausea   Telephone encounter attempt :  1st  A HIPAA compliant voice message was left requesting a return call.  Instructed patient to call back at [phone]. [place] [phone] [address] , [place] [address] Email : [email or web address]

## 2022-10-02 ENCOUNTER — Encounter (INDEPENDENT_AMBULATORY_CARE_PROVIDER_SITE_OTHER): Payer: Self-pay | Admitting: Vascular Surgery

## 2022-10-02 ENCOUNTER — Telehealth: Payer: Self-pay | Admitting: *Deleted

## 2022-10-02 ENCOUNTER — Ambulatory Visit (INDEPENDENT_AMBULATORY_CARE_PROVIDER_SITE_OTHER): Payer: Medicare HMO | Admitting: Vascular Surgery

## 2022-10-02 VITALS — BP 122/71 | HR 76 | Resp 16 | Wt 183.0 lb

## 2022-10-02 DIAGNOSIS — I6523 Occlusion and stenosis of bilateral carotid arteries: Secondary | ICD-10-CM

## 2022-10-02 DIAGNOSIS — I701 Atherosclerosis of renal artery: Secondary | ICD-10-CM | POA: Diagnosis not present

## 2022-10-02 DIAGNOSIS — G8929 Other chronic pain: Secondary | ICD-10-CM | POA: Diagnosis not present

## 2022-10-02 DIAGNOSIS — I10 Essential (primary) hypertension: Secondary | ICD-10-CM

## 2022-10-02 DIAGNOSIS — R109 Unspecified abdominal pain: Secondary | ICD-10-CM

## 2022-10-02 DIAGNOSIS — I6529 Occlusion and stenosis of unspecified carotid artery: Secondary | ICD-10-CM | POA: Insufficient documentation

## 2022-10-02 NOTE — Assessment & Plan Note (Signed)
Patient has recently undergone a CT angiogram of the abdomen pelvis on multiple occasions for chronic abdominal pain.  I have independently reviewed her most recent scan.  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  Her celiac, SMA, and IMA are all widely patent.  The renal artery stenosis would not be a cause of her abdominal pain and she does not have large vessel visceral disease that would cause chronic mesenteric ischemia that we would treat.  I have told her from a vascular standpoint, I do not think I have a cause of her abdominal pain and probably not much help to her.  I will reach out to her gastroenterologist to see what further work-up you would recommend.

## 2022-10-02 NOTE — Assessment & Plan Note (Signed)
blood pressure control important in reducing the progression of atherosclerotic disease. On appropriate oral medications.  

## 2022-10-02 NOTE — Telephone Encounter (Signed)
     Patient  visit on 09/24/2022  at [facility] was for nausea  Have you been able to follow up with your primary care physician?seeing  today  The patient was able to obtain any needed medicine or equipment.  Are there diet recommendations that you are having difficulty following?  Patient expresses understanding of discharge instructions and education provided has no other needs at this time.    [place] [phone] [address] , [place] [address] Email : [email or web address]

## 2022-10-02 NOTE — Assessment & Plan Note (Signed)
No new focal neurologic symptoms.  To have her carotid stenosis checked with duplex later this year.

## 2022-10-02 NOTE — Progress Notes (Signed)
MRN : [id number]  Morgan [name] is a 71 y.o. (12/04/1951) female who presents with chief complaint of  Chief Complaint  Patient presents with   Follow-up    [facility] ED consult nausea,vomiting,abdominal pain. CT in epic  .  History of Present Illness: Patient returns today on referral from the [facility] ER.  She has been having intractable abdominal pain now for several months and has been to the ER and had what appeared to be 4 CT scans in the last month.  The last 2 were CT angiograms and I have independently reviewed her CT angiogram.  On this, she does have a left renal artery stenosis but her celiac, SMA, and IMA are all widely patent and there is no obvious cause for pain identified.  She was referred for the finding on the CT scan.  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  She has an appointment for her carotid disease later this year.  No focal neurologic symptoms. Specifically, the patient denies amaurosis fugax, speech or swallowing difficulties, or arm or leg weakness or numbness   Current Outpatient Medications  Medication Sig Dispense Refill   acetaminophen (TYLENOL) 325 MG tablet Take 2 tablets (650 mg total) by mouth every 4 (four) hours as needed for mild pain (or temp > 37.5 C (99.5 F)).     carbidopa-levodopa (SINEMET IR) 25-100 MG tablet Take 2 tablets by mouth 3 (three) times daily.     erythromycin (E-MYCIN) 250 MG tablet Take one tablet three times daily prior to meals for max four weeks 90 tablet 0   hydrOXYzine (ATARAX) 25 MG tablet Take 1 tablet (25 mg total) by mouth 2 (two) times daily as needed for anxiety or nausea. 30 tablet 0   ibuprofen (ADVIL) 400 MG tablet Take 1 tablet (400 mg total) by mouth every 8 (eight) hours as needed for headache or moderate pain. 20 tablet 0   ondansetron (ZOFRAN-ODT) 4 MG disintegrating tablet Take 1 tablet (4 mg total) by mouth every 8 (eight) hours as needed for nausea or vomiting. 20 tablet  0   oxyCODONE (ROXICODONE) 5 MG immediate release tablet Take 1 tablet (5 mg total) by mouth every 8 (eight) hours as needed for moderate pain. 6 tablet 0   OXYGEN Inhale 3 L/min into the lungs See admin instructions. 3 L/min at bedtime and as needed for shortness of breath throughout the day     promethazine (PHENERGAN) 12.5 MG tablet Take 1 tablet (12.5 mg total) by mouth every 12 (twelve) hours as needed for nausea or vomiting. 20 tablet 0   topiramate (TOPAMAX) 50 MG tablet Take 1 tablet (50 mg total) by mouth at bedtime. 90 tablet 0   No current facility-administered medications for this visit.    Past Medical History:  Diagnosis Date   Allergy    Anemia    Anxiety    Arthritis    CHF (congestive heart failure) (HCC)    COPD (chronic obstructive pulmonary disease) (HCC)    Depression    GERD (gastroesophageal reflux disease)    Hypertension    MVP (mitral valve prolapse)    Parkinson disease     Past Surgical History:  Procedure Laterality Date   ABDOMINAL HYSTERECTOMY     APPENDECTOMY     BALLOON DILATION N/A 04/21/2020   Procedure: BALLOON DILATION;  Surgeon: Milus Banister, MD;  Location: [facility];  Service: Endoscopy;  Laterality: N/A;  pyloric/ GI   BALLOON DILATION N/A 07/04/2020   Procedure: BALLOON DILATION;  Surgeon: Mauri Pole, MD;  Location: [facility];  Service: Endoscopy;  Laterality: N/A;   BIOPSY  04/21/2020   Procedure: BIOPSY;  Surgeon: Milus Banister, MD;  Location: [facility];  Service: Endoscopy;;   BIOPSY  07/04/2020   Procedure: BIOPSY;  Surgeon: Mauri Pole, MD;  Location: [facility];  Service: Endoscopy;;  EGD and COLON   BREAST SURGERY Right 1988   tumor removal   CHOLECYSTECTOMY N/A 05/18/2020   Procedure: LAPAROSCOPIC CHOLECYSTECTOMY;  Surgeon: Johnathan Hausen, MD;  Location: [facility];  Service: General;  Laterality: N/A;   COLONOSCOPY WITH PROPOFOL N/A 07/04/2020   Procedure: COLONOSCOPY WITH PROPOFOL;  Surgeon: Mauri Pole, MD;  [facility]: [facility];  Service: Endoscopy;  Laterality: N/A;   COLONOSCOPY WITH PROPOFOL N/A 11/03/2021   Procedure: COLONOSCOPY WITH PROPOFOL;  Surgeon: Lesly Rubenstein, MD;  Location: [facility] END[facility];  Service: Endoscopy;  Laterality: N/A;   ESOPHAGEAL DILATION  08/11/2020   Procedure: PYLORIC DILATION;  Surgeon: Doran Stabler, MD;  [facility]: [facility];  Service: Gastroenterology;;   ESOPHAGOGASTRODUODENOSCOPY (EGD) WITH PROPOFOL N/A 04/21/2020   Procedure: ESOPHAGOGASTRODUODENOSCOPY (EGD) WITH PROPOFOL;  Surgeon: Milus Banister, MD;  [facility];  Service: Endoscopy;  Laterality: N/A;   ESOPHAGOGASTRODUODENOSCOPY (EGD) WITH PROPOFOL N/A 07/04/2020   Procedure: ESOPHAGOGASTRODUODENOSCOPY (EGD) WITH PROPOFOL;  Surgeon: Mauri Pole, MD;  [facility];  Service: Endoscopy;  Laterality: N/A;   ESOPHAGOGASTRODUODENOSCOPY (EGD) WITH PROPOFOL N/A 08/11/2020   Procedure: ESOPHAGOGASTRODUODENOSCOPY (EGD) WITH PROPOFOL;  Surgeon: Doran Stabler, MD;  [facility];  Service: Gastroenterology;  Laterality: N/A;   ESOPHAGOGASTRODUODENOSCOPY (EGD) WITH PROPOFOL N/A 11/03/2021   Procedure: ESOPHAGOGASTRODUODENOSCOPY (EGD) WITH PROPOFOL;  Surgeon: Lesly Rubenstein, MD;  Location: [facility];  Service: Endoscopy;  Laterality: N/A;   ESOPHAGOGASTRODUODENOSCOPY (EGD) WITH PROPOFOL N/A 05/07/2022   Procedure: ESOPHAGOGASTRODUODENOSCOPY (EGD) WITH PROPOFOL;  Surgeon: Lin Landsman, MD;  Location: [facility] ENDOSCOPY;  Service: Gastroenterology;  Laterality: N/A;   KNEE SURGERY  2005   2 times left   KNEE SURGERY Right    KYPHOPLASTY N/A 07/20/2021   Procedure: T 10KYPHOPLASTY;  Surgeon: Hessie Knows, MD;  Location: [facility] ORS;  Service: Orthopedics;  Laterality: N/A;   KYPHOPLASTY N/A 08/23/2021   Procedure: T9 KYPHOPLASTY;  Surgeon: Hessie Knows, MD;  Location: [facility] ORS;  Service: Orthopedics;  Laterality: N/A;   POLYPECTOMY  07/04/2020    Procedure: POLYPECTOMY;  Surgeon: Mauri Pole, MD;  [facility];  Service: Endoscopy;;   TUBAL LIGATION       Social History   Tobacco Use   Smoking status: Former    Packs/day: 1.00    Years: 15.00    Total pack years: 15.00    Types: Cigarettes    Quit date: 09/12/2017    Years since quitting: 5.0   Smokeless tobacco: Never  Vaping Use   Vaping Use: Never used  Substance Use Topics   Alcohol use: Not Currently   Drug use: No      Family History  Problem Relation Age of Onset   Breast cancer Mother 11   Ovarian cancer Mother 14   Brain cancer Mother    Hypertension Father    Hyperlipidemia Father    Heart disease Father    Colon cancer Father        diagnosed in his 55s   Breast cancer Maternal Grandmother 94   Diabetes  Paternal Grandmother    Breast cancer Paternal Grandmother 72   Other Son        drug over dose   Colon cancer Maternal Grandfather        dx in his 41s   Liver disease Maternal Grandfather    Breast cancer Maternal Aunt 60   Breast cancer Maternal Aunt 60   Colon cancer Maternal Uncle    Stomach cancer Neg Hx    Pancreatic cancer Neg Hx    Esophageal cancer Neg Hx      Allergies  Allergen Reactions   Meperidine Hives and Anaphylaxis   Strawberry Extract Hives and Swelling   Sucralfate Nausea Only and Other (See Comments)    Severe nausea Other reaction(s): Other (See Comments) Severe nausea Severe nausea   Wasp Venom Hives     REVIEW OF SYSTEMS (Negative unless checked)  Constitutional: '[]'$ Weight loss  '[]'$ Fever  '[]'$ Chills Cardiac: '[]'$ Chest pain   '[]'$ Chest pressure   '[]'$ Palpitations   '[]'$ Shortness of breath when laying flat   '[]'$ Shortness of breath at rest   '[]'$ Shortness of breath with exertion. Vascular:  '[]'$ Pain in legs with walking   '[]'$ Pain in legs at rest   '[]'$ Pain in legs when laying flat   '[]'$ Claudication   '[]'$ Pain in feet when walking  '[]'$ Pain in feet at rest  '[]'$ Pain in feet when laying flat   '[]'$ History of DVT    '[]'$ Phlebitis   '[]'$ Swelling in legs   '[]'$ Varicose veins   '[]'$ Non-healing ulcers Pulmonary:   '[]'$ Uses home oxygen   '[]'$ Productive cough   '[]'$ Hemoptysis   '[]'$ Wheeze  '[x]'$ COPD   '[]'$ Asthma Neurologic:  '[]'$ Dizziness  '[]'$ Blackouts   '[]'$ Seizures   '[]'$ History of stroke   '[]'$ History of TIA  '[]'$ Aphasia   '[]'$ Temporary blindness   '[]'$ Dysphagia   '[]'$ Weakness or numbness in arms   '[]'$ Weakness or numbness in legs Musculoskeletal:  '[x]'$ Arthritis   '[]'$ Joint swelling   '[]'$ Joint pain   '[]'$ Low back pain Hematologic:  '[]'$ Easy bruising  '[]'$ Easy bleeding   '[]'$ Hypercoagulable state   '[]'$ Anemic   Gastrointestinal:  '[]'$ Blood in stool   '[]'$ Vomiting blood  '[x]'$ Gastroesophageal reflux/heartburn   '[x]'$ Abdominal pain Genitourinary:  '[]'$ Chronic kidney disease   '[]'$ Difficult urination  '[]'$ Frequent urination  '[]'$ Burning with urination   '[]'$ Hematuria Skin:  '[]'$ Rashes   '[]'$ Ulcers   '[]'$ Wounds Psychological:  '[x]'$ History of anxiety   '[x]'$  History of major depression.  Physical Examination  BP 122/71 (BP Location: Left Arm)   Pulse 76   Resp 16   Wt 183 lb (83 kg)   BMI 24.14 kg/m  Gen:  WD/WN, NAD Head: Teller/AT, No temporalis wasting. Ear/Nose/Throat: Hearing grossly intact, nares w/o erythema or drainage Eyes: Conjunctiva clear. Sclera non-icteric Neck: Supple.  Trachea midline Pulmonary:  Good air movement, no use of accessory muscles.  Cardiac: RRR, no JVD Vascular:  Vessel Right Left  Radial Palpable Palpable                                   Gastrointestinal: soft, non-tender/non-distended. Mildly tender Musculoskeletal: M/S 5/5 throughout.  No deformity or atrophy. No edema. Neurologic: Sensation grossly intact in extremities.  Symmetrical.  Speech is fluent.  Psychiatric: Judgment intact, Mood & affect appropriate for pt's clinical situation. Dermatologic: No rashes or ulcers noted.  No cellulitis or open wounds.      Labs Recent Results (from the past 2160 hour(s))  POCT rapid strep A     Status: Abnormal  Collection Time: 07/12/22  9:55  AM  Result Value Ref Range   Rapid Strep A Screen Positive (A) Negative  POC COVID-19 BinaxNow     Status: Normal   Collection Time: 07/12/22  9:56 AM  Result Value Ref Range   SARS Coronavirus 2 Ag Negative Negative  Pulmonary function test     Status: None   Collection Time: 07/16/22  8:57 AM  Result Value Ref Range   FVC-Pre 2.07 L   FVC-%Pred-Pre 52 %   FVC-Post 2.24 L   FVC-%Pred-Post 56 %   FVC-%Change-Post 8 %   FEV1-Pre 1.69 L   FEV1-%Pred-Pre 56 %   FEV1-Post 1.84 L   FEV1-%Pred-Post 61 %   FEV1-%Change-Post 8 %   FEV6-Pre 2.07 L   FEV6-%Pred-Pre 54 %   FEV6-Post 2.24 L   FEV6-%Pred-Post 59 %   FEV6-%Change-Post 8 %   Pre FEV1/FVC ratio 82 %   FEV1FVC-%Pred-Pre 107 %   Post FEV1/FVC ratio 82 %   FEV1FVC-%Change-Post 0 %   Pre FEV6/FVC Ratio 100 %   FEV6FVC-%Pred-Pre 104 %   Post FEV6/FVC ratio 100 %   FEV6FVC-%Pred-Post 104 %   FEF 25-75 Pre 1.84 L/sec   FEF2575-%Pred-Pre 77 %   FEF 25-75 Post 2.41 L/sec   FEF2575-%Pred-Post 101 %   FEF2575-%Change-Post 30 %   RV 2.33 L   RV % pred 92 %   TLC 4.66 L   TLC % pred 76 %   DLCO unc 13.18 ml/min/mmHg   DLCO unc % pred 53 %   DLCO cor 14.95 ml/min/mmHg   DLCO cor % pred 61 %   DL/VA 3.90 ml/min/mmHg/L   DL/VA % pred 98 %  Basic metabolic panel     Status: Abnormal   Collection Time: 08/03/22  3:51 PM  Result Value Ref Range   Sodium 138 135 - 145 mmol/L   Potassium 2.6 (LL) 3.5 - 5.1 mmol/L    Comment: CRITICAL RESULT CALLED TO, READ BACK BY AND VERIFIED WITH JACKIE FERGUSON '@1639'$  08/03/22 MJU    Chloride 102 98 - 111 mmol/L   CO2 25 22 - 32 mmol/L   Glucose, Bld 96 70 - 99 mg/dL    Comment: Glucose reference range applies only to samples taken after fasting for at least 8 hours.   BUN 18 8 - 23 mg/dL   Creatinine, Ser 0.95 0.44 - 1.00 mg/dL   Calcium 8.2 (L) 8.9 - 10.3 mg/dL   GFR, Estimated >60 >60 mL/min    Comment: (NOTE) Calculated using the CKD-EPI Creatinine Equation (2021)    Anion gap 11 5  - 15    Comment: Performed at [facility], [address] [place], [place] [address]  CBC     Status: Abnormal   Collection Time: 08/03/22  3:51 PM  Result Value Ref Range   WBC 6.7 4.0 - 10.5 K/uL   RBC 4.01 3.87 - 5.11 MIL/uL   Hemoglobin 10.2 (L) 12.0 - 15.0 g/dL   HCT 32.6 (L) 36.0 - 46.0 %   MCV 81.3 80.0 - 100.0 fL   MCH 25.4 (L) 26.0 - 34.0 pg   MCHC 31.3 30.0 - 36.0 g/dL   RDW 16.0 (H) 11.5 - 15.5 %   Platelets 156 150 - 400 K/uL    Comment: PLATELET COUNT CONFIRMED BY SMEAR   nRBC 0.0 0.0 - 0.2 %    Comment: Performed at [facility], [address], [place], [place] [address]  Troponin I (High  Sensitivity)     Status: None   Collection Time: 08/03/22  5:01 PM  Result Value Ref Range   Troponin I (High Sensitivity) 4 <18 ng/L    Comment: (NOTE) Elevated high sensitivity troponin I (hsTnI) values and significant  changes across serial measurements may suggest ACS but many other  chronic and acute conditions are known to elevate hsTnI results.  Refer to the "Links" section for chest pain algorithms and additional  guidance. Performed at [facility], [address] [place], [place] [address]   Magnesium     Status: None   Collection Time: 08/03/22  6:38 PM  Result Value Ref Range   Magnesium 1.9 1.7 - 2.4 mg/dL    Comment: Performed at [facility], [address] [place], [place] [address]  CBC     Status: Abnormal   Collection Time: 08/03/22  8:58 PM  Result Value Ref Range   WBC 5.6 4.0 - 10.5 K/uL   RBC 3.82 (L) 3.87 - 5.11 MIL/uL   Hemoglobin 9.9 (L) 12.0 - 15.0 g/dL   HCT 31.0 (L) 36.0 - 46.0 %   MCV 81.2 80.0 - 100.0 fL   MCH 25.9 (L) 26.0 - 34.0 pg   MCHC 31.9 30.0 - 36.0 g/dL   RDW 16.1 (H) 11.5 - 15.5 %   Platelets 206 150 - 400 K/uL   nRBC 0.0 0.0 - 0.2 %    Comment: Performed at [facility], [address] [place], [place] [address]  Creatinine, serum     Status: None   Collection Time:  08/03/22  8:58 PM  Result Value Ref Range   Creatinine, Ser 0.83 0.44 - 1.00 mg/dL   GFR, Estimated >60 >60 mL/min    Comment: (NOTE) Calculated using the CKD-EPI Creatinine Equation (2021) Performed at [facility], [address] [place], [place] [address]   TSH     Status: None   Collection Time: 08/03/22  8:58 PM  Result Value Ref Range   TSH 1.377 0.350 - 4.500 uIU/mL    Comment: Performed by a 3rd Generation assay with a functional sensitivity of <=0.01 uIU/mL. Performed at [facility], [address] [place], [place] [address]   Comprehensive metabolic panel     Status: Abnormal   Collection Time: 08/04/22  6:43 AM  Result Value Ref Range   Sodium 139 135 - 145 mmol/L   Potassium 3.1 (L) 3.5 - 5.1 mmol/L   Chloride 106 98 - 111 mmol/L   CO2 28 22 - 32 mmol/L   Glucose, Bld 86 70 - 99 mg/dL    Comment: Glucose reference range applies only to samples taken after fasting for at least 8 hours.   BUN 14 8 - 23 mg/dL   Creatinine, Ser 0.81 0.44 - 1.00 mg/dL   Calcium 8.1 (L) 8.9 - 10.3 mg/dL   Total Protein 5.3 (L) 6.5 - 8.1 g/dL   Albumin 2.7 (L) 3.5 - 5.0 g/dL   AST 9 (L) 15 - 41 U/L   ALT <5 0 - 44 U/L   Alkaline Phosphatase 108 38 - 126 U/L   Total Bilirubin 0.5 0.3 - 1.2 mg/dL   GFR, Estimated >60 >60 mL/min    Comment: (NOTE) Calculated using the CKD-EPI Creatinine Equation (2021)    Anion gap 5 5 - 15    Comment: Performed at [facility], [address], [place], [place] [address]  CBC     Status: Abnormal  Collection Time: 08/04/22  6:43 AM  Result Value Ref Range   WBC 5.3 4.0 - 10.5 K/uL   RBC 3.90 3.87 - 5.11 MIL/uL   Hemoglobin 10.1 (L) 12.0 - 15.0 g/dL   HCT 31.8 (L) 36.0 - 46.0 %   MCV 81.5 80.0 - 100.0 fL   MCH 25.9 (L) 26.0 - 34.0 pg   MCHC 31.8 30.0 - 36.0 g/dL   RDW 15.8 (H) 11.5 - 15.5 %   Platelets 205 150 - 400 K/uL   nRBC 0.0 0.0 - 0.2 %    Comment: Performed at [facility], [address],  [place], [place] [address]  Basic metabolic panel     Status: Abnormal   Collection Time: 08/05/22  5:23 AM  Result Value Ref Range   Sodium 139 135 - 145 mmol/L   Potassium 3.2 (L) 3.5 - 5.1 mmol/L   Chloride 100 98 - 111 mmol/L   CO2 31 22 - 32 mmol/L   Glucose, Bld 95 70 - 99 mg/dL    Comment: Glucose reference range applies only to samples taken after fasting for at least 8 hours.   BUN 12 8 - 23 mg/dL   Creatinine, Ser 0.76 0.44 - 1.00 mg/dL   Calcium 8.6 (L) 8.9 - 10.3 mg/dL   GFR, Estimated >60 >60 mL/min    Comment: (NOTE) Calculated using the CKD-EPI Creatinine Equation (2021)    Anion gap 8 5 - 15    Comment: Performed at [facility], [address], [place], [place] [address]  Magnesium     Status: None   Collection Time: 08/05/22  5:23 AM  Result Value Ref Range   Magnesium 2.0 1.7 - 2.4 mg/dL    Comment: Performed at [facility], [address] [place], [place] [address]  Glucose, capillary     Status: None   Collection Time: 08/05/22  5:29 AM  Result Value Ref Range   Glucose-Capillary 96 70 - 99 mg/dL    Comment: Glucose reference range applies only to samples taken after fasting for at least 8 hours.  Glucose, capillary     Status: None   Collection Time: 08/06/22  4:38 AM  Result Value Ref Range   Glucose-Capillary 87 70 - 99 mg/dL    Comment: Glucose reference range applies only to samples taken after fasting for at least 8 hours.  Basic metabolic panel     Status: Abnormal   Collection Time: 08/06/22  5:24 AM  Result Value Ref Range   Sodium 139 135 - 145 mmol/L   Potassium 3.7 3.5 - 5.1 mmol/L   Chloride 101 98 - 111 mmol/L   CO2 31 22 - 32 mmol/L   Glucose, Bld 93 70 - 99 mg/dL    Comment: Glucose reference range applies only to samples taken after fasting for at least 8 hours.   BUN 10 8 - 23 mg/dL   Creatinine, Ser 0.72 0.44 - 1.00 mg/dL   Calcium 8.5 (L) 8.9 - 10.3 mg/dL   GFR, Estimated >60 >60 mL/min    Comment:  (NOTE) Calculated using the CKD-EPI Creatinine Equation (2021)    Anion gap 7 5 - 15    Comment: Performed at [facility], [address] [place], [place] [address]  Basic metabolic panel     Status: Abnormal   Collection Time: 08/08/22 11:13 AM  Result Value Ref Range   Sodium 138 135 - 145 mmol/L   Potassium 3.4 (L) 3.5 -  5.1 mmol/L   Chloride 102 98 - 111 mmol/L   CO2 24 22 - 32 mmol/L   Glucose, Bld 137 (H) 70 - 99 mg/dL    Comment: Glucose reference range applies only to samples taken after fasting for at least 8 hours.   BUN 12 8 - 23 mg/dL   Creatinine, Ser 0.95 0.44 - 1.00 mg/dL   Calcium 9.3 8.9 - 10.3 mg/dL   GFR, Estimated >60 >60 mL/min    Comment: (NOTE) Calculated using the CKD-EPI Creatinine Equation (2021)    Anion gap 12 5 - 15    Comment: Performed at [facility], [address] [place], [place] [address]  CBC     Status: Abnormal   Collection Time: 08/08/22 11:13 AM  Result Value Ref Range   WBC 7.4 4.0 - 10.5 K/uL   RBC 4.78 3.87 - 5.11 MIL/uL   Hemoglobin 12.2 12.0 - 15.0 g/dL   HCT 37.6 36.0 - 46.0 %   MCV 78.7 (L) 80.0 - 100.0 fL   MCH 25.5 (L) 26.0 - 34.0 pg   MCHC 32.4 30.0 - 36.0 g/dL   RDW 15.3 11.5 - 15.5 %   Platelets 363 150 - 400 K/uL   nRBC 0.0 0.0 - 0.2 %    Comment: Performed at [facility], [address] [place], [place] [address]  Urinalysis, Routine w reflex microscopic     Status: Abnormal   Collection Time: 08/08/22 11:13 AM  Result Value Ref Range   Color, Urine YELLOW (A) YELLOW   APPearance HAZY (A) CLEAR   Specific Gravity, Urine 1.008 1.005 - 1.030   pH 6.0 5.0 - 8.0   Glucose, UA NEGATIVE NEGATIVE mg/dL   Hgb urine dipstick NEGATIVE NEGATIVE   Bilirubin Urine NEGATIVE NEGATIVE   Ketones, ur NEGATIVE NEGATIVE mg/dL   Protein, ur NEGATIVE NEGATIVE mg/dL   Nitrite POSITIVE (A) NEGATIVE   Leukocytes,Ua LARGE (A) NEGATIVE   RBC / HPF 0-5 0 - 5 RBC/hpf   WBC, UA 21-50 0 - 5 WBC/hpf    Bacteria, UA RARE (A) NONE SEEN   Squamous Epithelial / LPF 0-5 0 - 5   Mucus PRESENT     Comment: Performed at [facility], [place], [place] [address]  Troponin I (High Sensitivity)     Status: None   Collection Time: 08/08/22 11:13 AM  Result Value Ref Range   Troponin I (High Sensitivity) 5 <18 ng/L    Comment: (NOTE) Elevated high sensitivity troponin I (hsTnI) values and significant  changes across serial measurements may suggest ACS but many other  chronic and acute conditions are known to elevate hsTnI results.  Refer to the Links section for chest pain algorithms and additional  guidance. Performed at [facility], [address] [place], [place] [address] CORRECTED ON 08/23 AT 1247: PREVIOUSLY REPORTED AS 5   Brain natriuretic peptide     Status: None   Collection Time: 08/08/22 11:13 AM  Result Value Ref Range   B Natriuretic Peptide 77.7 0.0 - 100.0 pg/mL    Comment: Performed at [facility], [address] [place], [place] [address]  Hepatic function panel     Status: Abnormal   Collection Time: 08/08/22 11:13 AM  Result Value Ref Range   Total Protein 6.7 6.5 - 8.1 g/dL   Albumin 3.3 (L) 3.5 - 5.0 g/dL   AST 14 (L) 15 - 41 U/L   ALT <5 0 - 44  U/L   Alkaline Phosphatase 147 (H) 38 - 126 U/L   Total Bilirubin 0.7 0.3 - 1.2 mg/dL   Bilirubin, Direct <0.1 0.0 - 0.2 mg/dL   Indirect Bilirubin NOT CALCULATED 0.3 - 0.9 mg/dL    Comment: Performed at [facility], [address], [place], [place] [address]  Urine Culture     Status: Abnormal   Collection Time: 08/08/22 11:13 AM   Specimen: Urine, Clean Catch  Result Value Ref Range   Specimen Description      URINE, CLEAN CATCH Performed at [facility], [address], [place], [place] [address]    Special Requests      Normal Performed at [facility], [place], [place] [address]    Culture >=100,000 COLONIES/mL  ESCHERICHIA COLI (A)    Report Status 08/10/2022 FINAL    Organism ID, Bacteria ESCHERICHIA COLI (A)       Susceptibility   Escherichia coli - MIC*    AMPICILLIN <=2 SENSITIVE Sensitive     CEFAZOLIN <=4 SENSITIVE Sensitive     CEFEPIME <=0.12 SENSITIVE Sensitive     CEFTRIAXONE <=0.25 SENSITIVE Sensitive     CIPROFLOXACIN <=0.25 SENSITIVE Sensitive     GENTAMICIN <=1 SENSITIVE Sensitive     IMIPENEM <=0.25 SENSITIVE Sensitive     NITROFURANTOIN <=16 SENSITIVE Sensitive     TRIMETH/SULFA <=20 SENSITIVE Sensitive     AMPICILLIN/SULBACTAM <=2 SENSITIVE Sensitive     PIP/TAZO <=4 SENSITIVE Sensitive     * >=100,000 COLONIES/mL ESCHERICHIA COLI  Blood gas, venous     Status: Abnormal   Collection Time: 08/08/22  1:25 PM  Result Value Ref Range   pH, Ven 7.49 (H) 7.25 - 7.43   pCO2, Ven 50 44 - 60 mmHg   pO2, Ven <31 (LL) 32 - 45 mmHg   Bicarbonate 38.1 (H) 20.0 - 28.0 mmol/L   Acid-Base Excess 12.7 (H) 0.0 - 2.0 mmol/L   O2 Saturation 23.4 %   Patient temperature 37.0     Comment: Performed at [facility], [place], [place] [address]  Troponin I (High Sensitivity)     Status: None   Collection Time: 08/08/22  1:25 PM  Result Value Ref Range   Troponin I (High Sensitivity) 4 <18 ng/L    Comment: (NOTE) Elevated high sensitivity troponin I (hsTnI) values and significant  changes across serial measurements may suggest ACS but many other  chronic and acute conditions are known to elevate hsTnI results.  Refer to the "Links" section for chest pain algorithms and additional  guidance. Performed at [facility], [address] [place], [place] [address]   POC COVID-19 BinaxNow     Status: Normal   Collection Time: 08/15/22  3:30 PM  Result Value Ref Range   SARS Coronavirus 2 Ag Negative Negative  Comprehensive metabolic panel     Status: Abnormal   Collection Time: 09/06/22  9:42 AM  Result Value Ref Range   Sodium 136 135 - 145 mEq/L    Potassium 3.5 3.5 - 5.1 mEq/L   Chloride 103 96 - 112 mEq/L   CO2 23 19 - 32 mEq/L   Glucose, Bld 106 (H) 70 - 99 mg/dL   BUN 18 6 - 23 mg/dL   Creatinine, Ser 0.85 0.40 - 1.20 mg/dL   Total Bilirubin 0.6 0.2 - 1.2 mg/dL   Alkaline Phosphatase 125 (H) 39 - 117 U/L   AST 5 0 - 37 U/L  ALT 1 0 - 35 U/L   Total Protein 5.9 (L) 6.0 - 8.3 g/dL   Albumin 3.3 (L) 3.5 - 5.2 g/dL   GFR 70.01 >60.00 mL/min    Comment: Calculated using the CKD-EPI Creatinine Equation (2021)   Calcium 8.9 8.4 - 10.5 mg/dL  CBC     Status: Abnormal   Collection Time: 09/06/22  9:42 AM  Result Value Ref Range   WBC 10.4 4.0 - 10.5 K/uL   RBC 4.45 3.87 - 5.11 Mil/uL   Platelets 245.0 150.0 - 400.0 K/uL   Hemoglobin 11.9 (L) 12.0 - 15.0 g/dL   HCT 36.5 36.0 - 46.0 %   MCV 82.0 78.0 - 100.0 fl   MCHC 32.5 30.0 - 36.0 g/dL   RDW 17.2 (H) 11.5 - 15.5 %  Giardia antigen     Status: None   Collection Time: 09/07/22  3:18 PM   Specimen: Stool  Result Value Ref Range   MICRO NUMBER: [id number]    SPECIMEN QUALITY: Adequate    Source: STOOL    STATUS: FINAL    RESULT: Not Detected     Comment: Reference Range:Not Detected   COMMENT:      NOTE: Due to intermittent shedding, one negative sample does not necessarily rule out the presence of a parasitic infection.  Gastrointestinal Pathogen Pnl RT, PCR     Status: None   Collection Time: 09/07/22  3:18 PM  Result Value Ref Range   CampyloBacter Group NOT DETECTED NOT DETECTED   Salmonella species NOT DETECTED NOT DETECTED   Shigella Species NOT DETECTED NOT DETECTED   Vibrio Group NOT DETECTED NOT DETECTED   Yersinia enterocolitica NOT DETECTED NOT DETECTED   Shiga Toxin 1 NOT DETECTED NOT DETECTED   Shiga Toxin 2 NOT DETECTED NOT DETECTED   Norovirus GI/GII NOT DETECTED NOT DETECTED   Rotavirus A NOT DETECTED NOT DETECTED    Comment: Organisms included in the Campylobacter group include C. coli, C. jejuni, and C. lari. Organisms included in the Shigella  species include S. dysenteriae, S. boydii, S. sonnei, and S. flexneri. Organisms included in the Vibrio group include V. cholerae and V. parahaemolyticus. . The analytical performance characteristics of this assay have been determined by [facility]. The modifications have not been cleared or approved by the FDA. This assay has been validated pursuant to the CLIA  regulations and is used for clinical purposes.   C. difficile GDH and Toxin A/B     Status: None   Collection Time: 09/07/22  3:18 PM  Result Value Ref Range   MICRO NUMBER: [id number]    SPECIMEN QUALITY: Adequate    Source STOOL    STATUS: FINAL    GDH ANTIGEN Not Detected    TOXIN A AND B Not Detected    COMMENT      No toxigenic C. difficile detected For additional information, please refer to http://education.[facility].com/faq/FAQ136 (This link is being provided for informational/educational purposes only.)  Lipase, blood     Status: None   Collection Time: 09/09/22  9:50 AM  Result Value Ref Range   Lipase 23 11 - 51 U/L    Comment: Performed at [facility], [address] [place], [place] [address]  Comprehensive metabolic panel     Status: Abnormal   Collection Time: 09/09/22  9:50 AM  Result Value Ref Range   Sodium 139 135 - 145 mmol/L   Potassium 3.1 (L) 3.5 - 5.1 mmol/L   Chloride 109 98 - 111  mmol/L   CO2 21 (L) 22 - 32 mmol/L   Glucose, Bld 109 (H) 70 - 99 mg/dL    Comment: Glucose reference range applies only to samples taken after fasting for at least 8 hours.   BUN 13 8 - 23 mg/dL   Creatinine, Ser 0.72 0.44 - 1.00 mg/dL   Calcium 8.8 (L) 8.9 - 10.3 mg/dL   Total Protein 6.2 (L) 6.5 - 8.1 g/dL   Albumin 3.2 (L) 3.5 - 5.0 g/dL   AST 14 (L) 15 - 41 U/L   ALT 6 0 - 44 U/L   Alkaline Phosphatase 127 (H) 38 - 126 U/L   Total Bilirubin 0.8 0.3 - 1.2 mg/dL   GFR, Estimated >60 >60 mL/min    Comment: (NOTE) Calculated using the CKD-EPI Creatinine Equation (2021)    Anion gap 9  5 - 15    Comment: Performed at [facility], [address] [place], [place] [address]  CBC     Status: Abnormal   Collection Time: 09/09/22  9:50 AM  Result Value Ref Range   WBC 7.9 4.0 - 10.5 K/uL   RBC 4.52 3.87 - 5.11 MIL/uL   Hemoglobin 12.0 12.0 - 15.0 g/dL   HCT 36.5 36.0 - 46.0 %   MCV 80.8 80.0 - 100.0 fL   MCH 26.5 26.0 - 34.0 pg   MCHC 32.9 30.0 - 36.0 g/dL   RDW 16.8 (H) 11.5 - 15.5 %   Platelets 297 150 - 400 K/uL   nRBC 0.0 0.0 - 0.2 %    Comment: Performed at [facility], [address], [place], [place] [address]  Magnesium     Status: None   Collection Time: 09/09/22  9:50 AM  Result Value Ref Range   Magnesium 1.8 1.7 - 2.4 mg/dL    Comment: Performed at [facility], [address] [place], [place] [address]  Lactic acid, plasma     Status: None   Collection Time: 09/09/22 11:29 AM  Result Value Ref Range   Lactic Acid, Venous 1.8 0.5 - 1.9 mmol/L    Comment: Performed at [facility], [address] [place], [place] [address]  Urinalysis, Routine w reflex microscopic Urine, Clean Catch     Status: Abnormal   Collection Time: 09/09/22  1:31 PM  Result Value Ref Range   Color, Urine YELLOW YELLOW   APPearance CLEAR CLEAR   Specific Gravity, Urine 1.010 1.005 - 1.030   pH 7.0 5.0 - 8.0   Glucose, UA NEGATIVE NEGATIVE mg/dL   Hgb urine dipstick TRACE (A) NEGATIVE   Bilirubin Urine NEGATIVE NEGATIVE   Ketones, ur NEGATIVE NEGATIVE mg/dL   Protein, ur NEGATIVE NEGATIVE mg/dL   Nitrite NEGATIVE NEGATIVE   Leukocytes,Ua SMALL (A) NEGATIVE    Comment: Performed at [facility], [address] [place], [place] [address]  Urinalysis, Microscopic (reflex)     Status: Abnormal   Collection Time: 09/09/22  1:31 PM  Result Value Ref Range   RBC / HPF 6-10 0 - 5 RBC/hpf   WBC, UA 6-10 0 - 5 WBC/hpf   Bacteria, UA RARE (A) NONE SEEN   Squamous Epithelial / LPF 21-50 0 - 5    Comment: Performed at [facility], [address] [place], [place] [address]  Resp Panel by RT-PCR (Flu A&B, Covid) Anterior Nasal Swab     Status: None   Collection Time: 09/09/22  5:40 PM   Specimen: Anterior Nasal  Swab  Result Value Ref Range   SARS Coronavirus 2 by RT PCR NEGATIVE NEGATIVE    Comment: (NOTE) SARS-CoV-2 target nucleic acids are NOT DETECTED.  The SARS-CoV-2 RNA is generally detectable in upper respiratory specimens during the acute phase of infection. The lowest concentration of SARS-CoV-2 viral copies this assay can detect is 138 copies/mL. A negative result does not preclude SARS-Cov-2 infection and should not be used as the sole basis for treatment or other patient management decisions. A negative result may occur with  improper specimen collection/handling, submission of specimen other than nasopharyngeal swab, presence of viral mutation(s) within the areas targeted by this assay, and inadequate number of viral copies(<138 copies/mL). A negative result must be combined with clinical observations, patient history, and epidemiological information. The expected result is Negative.  Fact Sheet for Patients:  EntrepreneurPulse.com.au  Fact Sheet for Healthcare Providers:  IncredibleEmployment.be  This test is no t yet approved or cleared by the [place] FDA and  has been authorized for detection and/or diagnosis of SARS-CoV-2 by FDA under an Emergency Use Authorization (EUA). This EUA will remain  in effect (meaning this test can be used) for the duration of the COVID-19 declaration under Section 564(b)(1) of the Act, 21 U.S.C.section 360bbb-3(b)(1), unless the authorization is terminated  or revoked sooner.       Influenza A by PCR NEGATIVE NEGATIVE   Influenza B by PCR NEGATIVE NEGATIVE    Comment: (NOTE) The Xpert Xpress SARS-CoV-2/FLU/RSV plus assay is intended as an aid in the diagnosis of influenza from Nasopharyngeal swab  specimens and should not be used as a sole basis for treatment. Nasal washings and aspirates are unacceptable for Xpert Xpress SARS-CoV-2/FLU/RSV testing.  Fact Sheet for Patients: EntrepreneurPulse.com.au  Fact Sheet for Healthcare Providers: IncredibleEmployment.be  This test is not yet approved or cleared by the [place] FDA and has been authorized for detection and/or diagnosis of SARS-CoV-2 by FDA under an Emergency Use Authorization (EUA). This EUA will remain in effect (meaning this test can be used) for the duration of the COVID-19 declaration under Section 564(b)(1) of the Act, 21 U.S.C. section 360bbb-3(b)(1), unless the authorization is terminated or revoked.  Performed at [facility], [address] [place], [place] [address]   Basic metabolic panel     Status: Abnormal   Collection Time: 09/10/22  6:20 AM  Result Value Ref Range   Sodium 139 135 - 145 mmol/L   Potassium 3.4 (L) 3.5 - 5.1 mmol/L   Chloride 108 98 - 111 mmol/L   CO2 28 22 - 32 mmol/L   Glucose, Bld 90 70 - 99 mg/dL    Comment: Glucose reference range applies only to samples taken after fasting for at least 8 hours.   BUN 11 8 - 23 mg/dL   Creatinine, Ser 0.70 0.44 - 1.00 mg/dL   Calcium 8.1 (L) 8.9 - 10.3 mg/dL   GFR, Estimated >60 >60 mL/min    Comment: (NOTE) Calculated using the CKD-EPI Creatinine Equation (2021)    Anion gap 3 (L) 5 - 15    Comment: Performed at [facility], [address] [place], [place] [address]  CBC     Status: Abnormal   Collection Time: 09/10/22  6:20 AM  Result Value Ref Range   WBC 5.3 4.0 - 10.5 K/uL   RBC 3.97 3.87 - 5.11 MIL/uL   Hemoglobin 10.4 (L) 12.0 - 15.0 g/dL   HCT 32.4 (L) 36.0 - 46.0 %   MCV  81.6 80.0 - 100.0 fL   MCH 26.2 26.0 - 34.0 pg   MCHC 32.1 30.0 - 36.0 g/dL   RDW 17.0 (H) 11.5 - 15.5 %   Platelets 217 150 - 400 K/uL   nRBC 0.0 0.0 - 0.2 %    Comment: Performed at [facility], [address] [place], [place] [address]  CBC with Differential/Platelet     Status: Abnormal   Collection Time: 09/12/22  5:22 AM  Result Value Ref Range   WBC 5.5 4.0 - 10.5 K/uL   RBC 3.44 (L) 3.87 - 5.11 MIL/uL   Hemoglobin 9.1 (L) 12.0 - 15.0 g/dL   HCT 28.8 (L) 36.0 - 46.0 %   MCV 83.7 80.0 - 100.0 fL   MCH 26.5 26.0 - 34.0 pg   MCHC 31.6 30.0 - 36.0 g/dL   RDW 17.4 (H) 11.5 - 15.5 %   Platelets 201 150 - 400 K/uL    Comment: REPEATED TO VERIFY   nRBC 0.0 0.0 - 0.2 %   Neutrophils Relative % 52 %   Neutro Abs 2.8 1.7 - 7.7 K/uL   Lymphocytes Relative 37 %   Lymphs Abs 2.1 0.7 - 4.0 K/uL   Monocytes Relative 7 %   Monocytes Absolute 0.4 0.1 - 1.0 K/uL   Eosinophils Relative 4 %   Eosinophils Absolute 0.2 0.0 - 0.5 K/uL   Basophils Relative 0 %   Basophils Absolute 0.0 0.0 - 0.1 K/uL   Immature Granulocytes 0 %   Abs Immature Granulocytes 0.01 0.00 - 0.07 K/uL    Comment: Performed at [facility], [address], [place], [place] [address]  Basic metabolic panel     Status: Abnormal   Collection Time: 09/12/22  5:22 AM  Result Value Ref Range   Sodium 141 135 - 145 mmol/L   Potassium 3.5 3.5 - 5.1 mmol/L   Chloride 109 98 - 111 mmol/L   CO2 28 22 - 32 mmol/L   Glucose, Bld 94 70 - 99 mg/dL    Comment: Glucose reference range applies only to samples taken after fasting for at least 8 hours.   BUN 6 (L) 8 - 23 mg/dL   Creatinine, Ser 0.68 0.44 - 1.00 mg/dL   Calcium 8.1 (L) 8.9 - 10.3 mg/dL   GFR, Estimated >60 >60 mL/min    Comment: (NOTE) Calculated using the CKD-EPI Creatinine Equation (2021)    Anion gap 4 (L) 5 - 15    Comment: Performed at [facility], [address] [place], [place] [address]  Magnesium     Status: None   Collection Time: 09/12/22  5:22 AM  Result Value Ref Range   Magnesium 1.7 1.7 - 2.4 mg/dL    Comment: Performed at [facility], [address] [place], [place] [address]  CBC     Status:  Abnormal   Collection Time: 09/14/22  7:00 AM  Result Value Ref Range   WBC 5.2 4.0 - 10.5 K/uL   RBC 3.21 (L) 3.87 - 5.11 MIL/uL   Hemoglobin 8.4 (L) 12.0 - 15.0 g/dL   HCT 27.0 (L) 36.0 - 46.0 %   MCV 84.1 80.0 - 100.0 fL   MCH 26.2 26.0 - 34.0 pg   MCHC 31.1 30.0 - 36.0 g/dL   RDW 17.2 (H) 11.5 - 15.5 %   Platelets 188 150 - 400 K/uL   nRBC 0.0 0.0 - 0.2 %    Comment: Performed at [facility],  [place], [place] [address]  Basic metabolic panel     Status: Abnormal   Collection Time: 09/14/22  7:00 AM  Result Value Ref Range   Sodium 139 135 - 145 mmol/L    Comment: ELECTROLYTES REPEATED TO VERIFY DAS   Potassium 3.5 3.5 - 5.1 mmol/L   Chloride 108 98 - 111 mmol/L   CO2 30 22 - 32 mmol/L   Glucose, Bld 85 70 - 99 mg/dL    Comment: Glucose reference range applies only to samples taken after fasting for at least 8 hours.   BUN 6 (L) 8 - 23 mg/dL   Creatinine, Ser 0.77 0.44 - 1.00 mg/dL   Calcium 8.4 (L) 8.9 - 10.3 mg/dL   GFR, Estimated >60 >60 mL/min    Comment: (NOTE) Calculated using the CKD-EPI Creatinine Equation (2021)    Anion gap 1 (L) 5 - 15    Comment: Performed at [facility], [address] [place], [place] [address]  Lipase, blood     Status: None   Collection Time: 09/18/22  1:19 PM  Result Value Ref Range   Lipase 21 11 - 51 U/L    Comment: Performed at [facility], [address] [place], [place] [address]  Comprehensive metabolic panel     Status: Abnormal   Collection Time: 09/18/22  1:19 PM  Result Value Ref Range   Sodium 136 135 - 145 mmol/L   Potassium 2.9 (L) 3.5 - 5.1 mmol/L   Chloride 101 98 - 111 mmol/L   CO2 23 22 - 32 mmol/L   Glucose, Bld 150 (H) 70 - 99 mg/dL    Comment: Glucose reference range applies only to samples taken after fasting for at least 8 hours.   BUN 11 8 - 23 mg/dL   Creatinine, Ser 1.02 (H) 0.44 - 1.00 mg/dL   Calcium 9.0 8.9 - 10.3 mg/dL   Total Protein 6.8 6.5 - 8.1  g/dL   Albumin 3.3 (L) 3.5 - 5.0 g/dL   AST 20 15 - 41 U/L   ALT 6 0 - 44 U/L   Alkaline Phosphatase 174 (H) 38 - 126 U/L   Total Bilirubin 0.6 0.3 - 1.2 mg/dL   GFR, Estimated 60 (L) >60 mL/min    Comment: (NOTE) Calculated using the CKD-EPI Creatinine Equation (2021)    Anion gap 12 5 - 15    Comment: Performed at [facility], [address] [place], [place] [address]  CBC     Status: Abnormal   Collection Time: 09/18/22  1:19 PM  Result Value Ref Range   WBC 8.8 4.0 - 10.5 K/uL   RBC 4.73 3.87 - 5.11 MIL/uL   Hemoglobin 12.3 12.0 - 15.0 g/dL   HCT 38.5 36.0 - 46.0 %   MCV 81.4 80.0 - 100.0 fL   MCH 26.0 26.0 - 34.0 pg   MCHC 31.9 30.0 - 36.0 g/dL   RDW 16.0 (H) 11.5 - 15.5 %   Platelets 357 150 - 400 K/uL   nRBC 0.0 0.0 - 0.2 %    Comment: Performed at [facility], [address] [place], [place] [address]  Magnesium     Status: None   Collection Time: 09/18/22  1:19 PM  Result Value Ref Range   Magnesium 1.8 1.7 - 2.4 mg/dL    Comment: Performed at [facility], [address] [place], [place] [address]  Urinalysis, Routine w reflex microscopic Urine, Clean Catch  Status: Abnormal   Collection Time: 09/18/22  4:40 PM  Result Value Ref Range   Color, Urine YELLOW (A) YELLOW   APPearance HAZY (A) CLEAR   Specific Gravity, Urine 1.027 1.005 - 1.030   pH 8.0 5.0 - 8.0   Glucose, UA NEGATIVE NEGATIVE mg/dL   Hgb urine dipstick NEGATIVE NEGATIVE   Bilirubin Urine NEGATIVE NEGATIVE   Ketones, ur NEGATIVE NEGATIVE mg/dL   Protein, ur NEGATIVE NEGATIVE mg/dL   Nitrite NEGATIVE NEGATIVE   Leukocytes,Ua TRACE (A) NEGATIVE   RBC / HPF 0-5 0 - 5 RBC/hpf   WBC, UA 0-5 0 - 5 WBC/hpf   Bacteria, UA NONE SEEN NONE SEEN   Squamous Epithelial / LPF 6-10 0 - 5   Mucus PRESENT     Comment: Performed at [facility], [place], [place] [address]  Troponin I (High Sensitivity)     Status: None   Collection Time: 09/18/22   5:12 PM  Result Value Ref Range   Troponin I (High Sensitivity) 7 <18 ng/L    Comment: (NOTE) Elevated high sensitivity troponin I (hsTnI) values and significant  changes across serial measurements may suggest ACS but many other  chronic and acute conditions are known to elevate hsTnI results.  Refer to the "Links" section for chest pain algorithms and additional  guidance. Performed at [facility], [address] [place], [place] [address]   Lactic acid, plasma     Status: Abnormal   Collection Time: 09/18/22  5:19 PM  Result Value Ref Range   Lactic Acid, Venous 2.3 (HH) 0.5 - 1.9 mmol/L    Comment: CRITICAL RESULT CALLED TO, READ BACK BY AND VERIFIED WITH AMY COYNE 09/18/22 1751 MU Performed at [facility], [address] [place], [place] [address]   Lipase, blood     Status: None   Collection Time: 09/21/22  3:09 PM  Result Value Ref Range   Lipase 23 11 - 51 U/L    Comment: Performed at [facility], [address] [place], [place] [address]  Comprehensive metabolic panel     Status: Abnormal   Collection Time: 09/21/22  3:09 PM  Result Value Ref Range   Sodium 138 135 - 145 mmol/L   Potassium 2.8 (L) 3.5 - 5.1 mmol/L   Chloride 103 98 - 111 mmol/L   CO2 21 (L) 22 - 32 mmol/L   Glucose, Bld 103 (H) 70 - 99 mg/dL    Comment: Glucose reference range applies only to samples taken after fasting for at least 8 hours.   BUN 9 8 - 23 mg/dL   Creatinine, Ser 1.00 0.44 - 1.00 mg/dL   Calcium 8.9 8.9 - 10.3 mg/dL   Total Protein 5.4 (L) 6.5 - 8.1 g/dL   Albumin 2.8 (L) 3.5 - 5.0 g/dL   AST 8 (L) 15 - 41 U/L   ALT <5 0 - 44 U/L   Alkaline Phosphatase 146 (H) 38 - 126 U/L   Total Bilirubin 0.7 0.3 - 1.2 mg/dL   GFR, Estimated >60 >60 mL/min    Comment: (NOTE) Calculated using the CKD-EPI Creatinine Equation (2021)    Anion gap 14 5 - 15    Comment: Performed at [facility], [place] [address] [place], [place] [address]  CBC with Differential      Status: Abnormal   Collection Time: 09/21/22  3:09 PM  Result Value Ref Range   WBC 9.1 4.0 - 10.5 K/uL  RBC 4.01 3.87 - 5.11 MIL/uL   Hemoglobin 10.6 (L) 12.0 - 15.0 g/dL   HCT 32.0 (L) 36.0 - 46.0 %   MCV 79.8 (L) 80.0 - 100.0 fL   MCH 26.4 26.0 - 34.0 pg   MCHC 33.1 30.0 - 36.0 g/dL   RDW 15.9 (H) 11.5 - 15.5 %   Platelets 260 150 - 400 K/uL   nRBC 0.0 0.0 - 0.2 %   Neutrophils Relative % 75 %   Neutro Abs 6.7 1.7 - 7.7 K/uL   Lymphocytes Relative 21 %   Lymphs Abs 1.9 0.7 - 4.0 K/uL   Monocytes Relative 4 %   Monocytes Absolute 0.4 0.1 - 1.0 K/uL   Eosinophils Relative 0 %   Eosinophils Absolute 0.0 0.0 - 0.5 K/uL   Basophils Relative 0 %   Basophils Absolute 0.0 0.0 - 0.1 K/uL   Immature Granulocytes 0 %   Abs Immature Granulocytes 0.03 0.00 - 0.07 K/uL    Comment: Performed at [facility] [address] [place], [place] [address]  Lipase, blood     Status: None   Collection Time: 09/23/22 11:05 AM  Result Value Ref Range   Lipase 32 11 - 51 U/L    Comment: Performed at [facility], [address] [place], [place] [address]  Comprehensive metabolic panel     Status: Abnormal   Collection Time: 09/23/22 11:05 AM  Result Value Ref Range   Sodium 140 135 - 145 mmol/L   Potassium 3.2 (L) 3.5 - 5.1 mmol/L   Chloride 105 98 - 111 mmol/L   CO2 27 22 - 32 mmol/L   Glucose, Bld 114 (H) 70 - 99 mg/dL    Comment: Glucose reference range applies only to samples taken after fasting for at least 8 hours.   BUN 10 8 - 23 mg/dL   Creatinine, Ser 1.00 0.44 - 1.00 mg/dL   Calcium 8.6 (L) 8.9 - 10.3 mg/dL   Total Protein 6.2 (L) 6.5 - 8.1 g/dL   Albumin 3.1 (L) 3.5 - 5.0 g/dL   AST 16 15 - 41 U/L   ALT 6 0 - 44 U/L   Alkaline Phosphatase 146 (H) 38 - 126 U/L   Total Bilirubin 0.5 0.3 - 1.2 mg/dL   GFR, Estimated >60 >60 mL/min    Comment: (NOTE) Calculated using the CKD-EPI Creatinine Equation (2021)    Anion gap 8 5 - 15    Comment: Performed at  [facility], [address] [place], [place] [address]  CBC     Status: Abnormal   Collection Time: 09/23/22 11:05 AM  Result Value Ref Range   WBC 9.9 4.0 - 10.5 K/uL   RBC 4.23 3.87 - 5.11 MIL/uL   Hemoglobin 10.8 (L) 12.0 - 15.0 g/dL   HCT 34.8 (L) 36.0 - 46.0 %   MCV 82.3 80.0 - 100.0 fL   MCH 25.5 (L) 26.0 - 34.0 pg   MCHC 31.0 30.0 - 36.0 g/dL   RDW 17.0 (H) 11.5 - 15.5 %   Platelets 313 150 - 400 K/uL   nRBC 0.0 0.0 - 0.2 %    Comment: Performed at [facility], [address] [place], [place] [address]  Urinalysis, Routine w reflex microscopic     Status: Abnormal   Collection Time: 09/24/22  1:30 AM  Result Value Ref Range   Color, Urine YELLOW (A) YELLOW   APPearance HAZY (A) CLEAR   Specific Gravity, Urine  1.015 1.005 - 1.030   pH 7.0 5.0 - 8.0   Glucose, UA NEGATIVE NEGATIVE mg/dL   Hgb urine dipstick NEGATIVE NEGATIVE   Bilirubin Urine NEGATIVE NEGATIVE   Ketones, ur NEGATIVE NEGATIVE mg/dL   Protein, ur NEGATIVE NEGATIVE mg/dL   Nitrite NEGATIVE NEGATIVE   Leukocytes,Ua TRACE (A) NEGATIVE   RBC / HPF 0-5 0 - 5 RBC/hpf   WBC, UA 6-10 0 - 5 WBC/hpf   Bacteria, UA RARE (A) NONE SEEN   Squamous Epithelial / LPF 0-5 0 - 5   Mucus PRESENT     Comment: Performed at [facility], [address], [place], [place] [address]    Radiology CT Angio Abd/Pel w/ and/or w/o  Result Date: 09/22/2022 CLINICAL DATA:  Renal artery stenosis with acute worsening of pain. Increased nausea and vomiting. 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 EXAM: CTA ABDOMEN AND PELVIS WITHOUT AND WITH CONTRAST TECHNIQUE: Multidetector CT imaging of the abdomen and pelvis was performed using the standard protocol during bolus administration of intravenous contrast. Multiplanar reconstructed images and MIPs were obtained and reviewed to evaluate the vascular anatomy. RADIATION DOSE REDUCTION: This exam was performed according to the departmental dose-optimization program which includes  automated exposure control, adjustment of the mA and/or kV according to patient size and/or use of iterative reconstruction technique. CONTRAST:  27m OMNIPAQUE IOHEXOL 350 MG/ML SOLN COMPARISON:  09/18/2022 FINDINGS: VASCULAR Aorta: Diffuse aortic calcification. No aneurysm or dissection. No significant stenosis. Patent throughout. Celiac: Calcification at the origin. Widely patent without evidence of significant stenosis. No aneurysm. SMA: Patent without evidence of aneurysm, dissection, vasculitis or significant stenosis. Renals: Calcifications. Proximal left renal artery stenosis with mild post stenotic dilatation. No change since prior study. The vessel remains patent and nephrograms are symmetrical. IMA: Patent without evidence of aneurysm, dissection, vasculitis or significant stenosis. Inflow: Patent without evidence of aneurysm, dissection, vasculitis or significant stenosis. Proximal Outflow: Bilateral common femoral and visualized portions of the superficial and profunda femoral arteries are patent without evidence of aneurysm, dissection, vasculitis or significant stenosis. Veins: No obvious venous abnormality within the limitations of this arterial phase study. Review of the MIP images confirms the above findings. NON-VASCULAR Lower chest: Lungs are clear. Hepatobiliary: No focal liver abnormality is seen. Status post cholecystectomy. No biliary dilatation. Pancreas: Unremarkable. No pancreatic ductal dilatation or surrounding inflammatory changes. Spleen: Normal in size without focal abnormality. Adrenals/Urinary Tract: Adrenal glands are unremarkable. Kidneys are normal, without renal calculi, focal lesion, or hydronephrosis. Bladder is unremarkable. Stomach/Bowel: Stomach, small bowel, and colon are not abnormally distended. No wall thickening or inflammatory changes. Colonic diverticulosis without evidence of acute diverticulitis. Suggestion of small lipoma along the mid transverse colon with  peripheral calcification, measuring about 1 cm diameter, unchanged since prior study. It is possible that this represents a involuting diverticulum. Lipomatous hypertrophy of the ileocecal valve. Mucosal fat throughout the colon is diffusely prominent, likely physiologic. Lymphatic: No significant lymphadenopathy. Reproductive: Status post hysterectomy. No adnexal masses. Other: No free air or free fluid in the abdomen. Abdominal wall musculature appears intact. Musculoskeletal: Mild degenerative changes in the spine. Kyphoplasty changes in the lower thoracic spine. IMPRESSION: VASCULAR Aortic atherosclerosis. No aortic aneurysm. 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 The vessel remains patent and nephrograms are symmetrical. No change. NON-VASCULAR No change in appearance. No evidence of bowel obstruction or inflammation. Suggestion of a small lipoma along the mid transverse colon without interval change. Electronically Signed   By: WGwyndolyn Saxon  Gerilyn Nestle M.D.   On: 09/22/2022 01:56   CT Angio Abd/Pel W and/or Wo Contrast  Result Date: 09/18/2022 CLINICAL DATA:  71 year old with vomiting and diarrhea. Evaluate for acute mesenteric ischemia. EXAM: CTA ABDOMEN AND PELVIS WITHOUT AND WITH CONTRAST TECHNIQUE: Multidetector CT imaging of the abdomen and pelvis was performed using the standard protocol during bolus administration of intravenous contrast. Multiplanar reconstructed images and MIPs were obtained and reviewed to evaluate the vascular anatomy. RADIATION DOSE REDUCTION: This exam was performed according to the departmental dose-optimization program which includes automated exposure control, adjustment of the mA and/or kV according to patient size and/or use of iterative reconstruction technique. CONTRAST:  142m OMNIPAQUE IOHEXOL 350 MG/ML SOLN COMPARISON:  CT abdomen and pelvis 09/09/2022 FINDINGS: VASCULAR Aorta: Atherosclerotic disease in the  descending thoracic aorta and abdominal aorta without aneurysm, dissection or significant stenosis. Celiac: Patent without evidence of aneurysm, dissection, vasculitis or significant stenosis. 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 SMA: Patent without evidence of aneurysm, dissection, vasculitis or significant stenosis. Renals: Right renal artery is patent without significant stenosis. No evidence for aneurysm or dissection involving the right renal artery. High-grade stenosis and near occlusion of the left renal artery 7 mm distal to the origin. Post stenotic dilatation in the left renal artery. No evidence for aneurysm or dissection in left renal artery. IMA: Patent without evidence of aneurysm, dissection, vasculitis or significant stenosis. Inflow: Patent without evidence of aneurysm, dissection, vasculitis or significant stenosis. Proximal Outflow: Proximal femoral arteries are patent bilaterally. Veins: Hepatic veins and portal veins are patent. No acute abnormality involving the iliac veins or IVC. Renal veins are patent. Review of the MIP images confirms the above findings. NON-VASCULAR Lower chest: 4 mm calcified granuloma in the left lower lobe on sequence 8, image 18. Otherwise, the visualized lung bases are clear. No pleural effusions. Hepatobiliary: Cholecystectomy. No biliary dilatation. No discrete liver lesion. Pancreas: Unremarkable. No pancreatic ductal dilatation or surrounding inflammatory changes. Spleen: Normal in size without focal abnormality. Adrenals/Urinary Tract: Normal adrenal glands. Stable appearance of both kidneys without stones, hydronephrosis or suspicious renal lesions. No ureter dilatation. Normal appearance of the urinary bladder. Stomach/Bowel: Normal appearance of the stomach. Multiple diverticula involving the sigmoid colon without acute inflammation. Again noted is a small very  low density structure with peripheral calcifications in the transverse colon on sequence 6, image 151. This could represent a partially calcified lipoma. No evidence for bowel dilatation or obstruction. No focal bowel inflammation. Appendix is not confidently identified. Lymphatic: No lymph node enlargement in the abdomen or pelvis. Reproductive: Status post hysterectomy. No adnexal masses. Other: Negative for free fluid.  Negative for free air. Musculoskeletal: No acute bone abnormality. Again noted is bone cement in 2 lower thoracic vertebral bodies most likely representing T8 and T9. IMPRESSION: 1. No acute abnormality in the abdomen or pelvis. Specifically, no evidence for acute mesenteric ischemia. 2. 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 3. Colonic diverticulosis without acute inflammation. 4. Question a small colonic lipoma with calcifications as described. No acute inflammatory changes in this area. 5. Aortic Atherosclerosis (ICD10-I70.0). Electronically Signed   By: AMarkus DaftM.D.   On: 09/18/2022 15:41   CT ABDOMEN PELVIS W CONTRAST  Result Date: 09/09/2022 CLINICAL DATA:  71year old female with left lower quadrant pain. Recent CT Abdomen and Pelvis negative for diverticulitis. EXAM: CT ABDOMEN AND PELVIS WITH CONTRAST TECHNIQUE:  Multidetector CT imaging of the abdomen and pelvis was performed using the standard protocol following bolus administration of intravenous contrast. RADIATION DOSE REDUCTION: This exam was performed according to the departmental dose-optimization program which includes automated exposure control, adjustment of the mA and/or kV according to patient size and/or use of iterative reconstruction technique. CONTRAST:  163m OMNIPAQUE IOHEXOL 300 MG/ML  SOLN COMPARISON:  CT Abdomen and Pelvis 09/06/2022 and earlier. FINDINGS: Lower chest: No pericardial or pleural effusion. Negative lung bases. Calcified aortic atherosclerosis.  Hepatobiliary: Absent gallbladder.  Stable liver and biliary tree. Pancreas: Atrophied. Spleen: Negative. Adrenals/Urinary Tract: Stable, negative. Symmetric renal enhancement and contrast excretion. Unremarkable bladder. Stomach/Bowel: Stable distal large bowel. Diverticulosis throughout the sigmoid colon. No active inflammation identified. Mostly decompressed upstream descending colon. Retained stool in the transverse and right colon. Diminutive or absent appendix. No dilated small bowel. Decompressed stomach and duodenum. No free air or free fluid. No mesenteric inflammation identified. Vascular/Lymphatic: Aortoiliac calcified atherosclerosis. Normal caliber abdominal aorta. Major arterial structures in the abdomen and pelvis remain patent. Portal venous system is patent. No lymphadenopathy identified. Reproductive: Absent uterus.  Diminutive or absent ovaries. Other: No pelvic free fluid.  Chronic pelvic phleboliths. Musculoskeletal: Previously augmented lower thoracic vertebrae. Underlying osteopenia. No acute osseous abnormality identified. IMPRESSION: 1. No acute or inflammatory process identified in the abdomen or pelvis. 2. Chronic sigmoid diverticulosis, no active inflammation. 3. Aortic Atherosclerosis (ICD10-I70.0). Electronically Signed   By: HGenevie AnnM.D.   On: 09/09/2022 12:54   CT ABDOMEN PELVIS W CONTRAST  Result Date: 09/06/2022 CLINICAL DATA:  Abdominal pain, acute, nonlocalized hx of diverticulitis EXAM: CT ABDOMEN AND PELVIS WITH CONTRAST TECHNIQUE: Multidetector CT imaging of the abdomen and pelvis was performed using the standard protocol following bolus administration of intravenous contrast. RADIATION DOSE REDUCTION: This exam was performed according to the departmental dose-optimization program which includes automated exposure control, adjustment of the mA and/or kV according to patient size and/or use of iterative reconstruction technique. CONTRAST:  109mOMNIPAQUE IOHEXOL 300 MG/ML   SOLN COMPARISON:  CT abdomen pelvis 08/03/2022 FINDINGS: Lower chest: No acute abnormality. Hepatobiliary: No focal liver abnormality is seen. Prior cholecystectomy. Pancreas: Unremarkable. No pancreatic ductal dilatation or surrounding inflammatory changes. Spleen: Normal in size without focal abnormality. Adrenals/Urinary Tract: Adrenal glands are unremarkable. No hydronephrosis or nephrolithiasis. Moderate bladder distension. Stomach/Bowel: The stomach is within normal limits. There is no evidence of bowel obstruction.The appendix is surgically absent. Sigmoid diverticulosis. No acute diverticulitis. Vascular/Lymphatic: Aorto bi-iliac atherosclerosis no AAA. No lymphadenopathy. Reproductive: Prior hysterectomy. Other: No bowel containing hernia.  No ascites.  No free air. Musculoskeletal: Chronic compression deformities of T8 and T9 prior vertebroplasty. Mild multilevel degenerative changes of the spine. No acute osseous abnormality. IMPRESSION: No acute findings in the abdomen or pelvis. Sigmoid diverticulosis without evidence of acute diverticulitis. Prior cholecystectomy, appendectomy, and hysterectomy. Electronically Signed   By: JaMaurine Simmering.D.   On: 09/06/2022 13:29    Assessment/Plan  Stenosis of left renal artery ([facility] recently undergone a CT angiogram of the abdomen pelvis on multiple occasions for chronic abdominal pain.  I have independently reviewed her most recent scan.  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  Her celiac, SMA, and IMA are all widely patent.  The renal artery stenosis would not be a cause of her abdominal pain.  May contribute to her blood pressure which she says is quite labile and consideration for intervention is reasonable.  I have asked her to  consider this and we will be seeing her later this year for her carotid stenosis follow-up and can do a renal duplex at that time.  I will reach out to her gastroenterologist to see if he has  any further work-up he would like to do for her abdominal pain which seems pretty debilitating.  Essential hypertension blood pressure control important in reducing the progression of atherosclerotic disease. On appropriate oral medications.   Carotid stenosis No new focal neurologic symptoms.  To have her carotid stenosis checked with duplex later this year.  Chronic abdominal pain Patient has recently undergone a CT angiogram of the abdomen pelvis on multiple occasions for chronic abdominal pain.  I have independently reviewed her most recent scan.  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  Her celiac, SMA, and IMA are all widely patent.  The renal artery stenosis would not be a cause of her abdominal pain and she does not have large vessel visceral disease that would cause chronic mesenteric ischemia that we would treat.  I have told her from a vascular standpoint, I do not think I have a cause of her abdominal pain and probably not much help to her.  I will reach out to her gastroenterologist to see what further work-up you would recommend.    Leotis Pain, MD  10/02/2022 10:40 AM    This note was created with Dragon medical transcription system.  Any errors from dictation are purely unintentional

## 2022-10-02 NOTE — Assessment & Plan Note (Signed)
Patient has recently undergone a CT angiogram of the abdomen pelvis on multiple occasions for chronic abdominal pain.  I have independently reviewed her most recent scan.  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  Her celiac, SMA, and IMA are all widely patent.  The renal artery stenosis would not be a cause of her abdominal pain.  May contribute to her blood pressure which she says is quite labile and consideration for intervention is reasonable.  I have asked her to consider this and we will be seeing her later this year for her carotid stenosis follow-up and can do a renal duplex at that time.  I will reach out to her gastroenterologist to see if he has any further work-up he would like to do for her abdominal pain which seems pretty debilitating.

## 2022-10-03 DIAGNOSIS — G20C Parkinsonism, unspecified: Secondary | ICD-10-CM | POA: Diagnosis not present

## 2022-10-03 DIAGNOSIS — G43419 Hemiplegic migraine, intractable, without status migrainosus: Secondary | ICD-10-CM | POA: Diagnosis not present

## 2022-10-03 DIAGNOSIS — R109 Unspecified abdominal pain: Secondary | ICD-10-CM | POA: Diagnosis not present

## 2022-10-03 DIAGNOSIS — G4700 Insomnia, unspecified: Secondary | ICD-10-CM | POA: Diagnosis not present

## 2022-10-03 DIAGNOSIS — R0683 Snoring: Secondary | ICD-10-CM | POA: Diagnosis not present

## 2022-10-03 DIAGNOSIS — I701 Atherosclerosis of renal artery: Secondary | ICD-10-CM | POA: Diagnosis not present

## 2022-10-03 MED ORDER — POTASSIUM CHLORIDE CRYS ER 20 MEQ PO TBCR: 20.0000 meq | EXTENDED_RELEASE_TABLET | Freq: Every day | ORAL | 3 refills | Status: DC

## 2022-10-03 NOTE — Telephone Encounter (Signed)
Tell him thank you for getting back to me with that information  Can you ask her why she takes this chronically?  Does she often have low potassium?

## 2022-10-04 ENCOUNTER — Other Ambulatory Visit: Payer: Self-pay | Admitting: Family

## 2022-10-04 DIAGNOSIS — R112 Nausea with vomiting, unspecified: Secondary | ICD-10-CM

## 2022-10-04 NOTE — Telephone Encounter (Signed)
Left message to return call to our office.  

## 2022-10-05 NOTE — Telephone Encounter (Signed)
Pt returning call requesting a call back [phone]

## 2022-10-05 NOTE — Telephone Encounter (Signed)
Patient called back in to follow up on missed called.

## 2022-10-05 NOTE — Telephone Encounter (Signed)
Spoke to pt and she said that she has low potassium often. This is why she takes it.

## 2022-10-05 NOTE — Telephone Encounter (Signed)
Left message to return call to our office.  

## 2022-10-09 ENCOUNTER — Telehealth: Payer: Self-pay | Admitting: Family

## 2022-10-09 NOTE — Telephone Encounter (Signed)
Left message for patient to call back and schedule Medicare Annual Wellness Visit (AWV) either virtually or phone  . Left  my Herbie Drape number [phone]    awvi 02/14/14 per [facility]     45 min for awv-i and in office appointments 30 min for awv-s  phone/virtual appointments

## 2022-10-11 ENCOUNTER — Ambulatory Visit (INDEPENDENT_AMBULATORY_CARE_PROVIDER_SITE_OTHER): Payer: Medicare HMO

## 2022-10-11 ENCOUNTER — Ambulatory Visit: Payer: Medicare HMO | Admitting: Family

## 2022-10-11 VITALS — Ht 73.0 in | Wt 183.0 lb

## 2022-10-11 DIAGNOSIS — Z0000 Encounter for general adult medical examination without abnormal findings: Secondary | ICD-10-CM

## 2022-10-11 NOTE — Patient Instructions (Addendum)
Morgan [name] , Thank you for taking time to come for your Medicare Wellness Visit. I appreciate your ongoing commitment to your health goals. Please review the following plan we discussed and let me know if I can assist you in the future.   These are the goals we discussed:  Goals       Manage My Medicine      Timeframe:  Long-Range Goal Priority:  High Start Date:        09/22/21                     Expected End Date:     09/22/22                  Follow Up Date Nov 2022   - call for medicine refill 2 or 3 days before it runs out - call if I am sick and can't take my medicine - keep a list of all the medicines I take; vitamins and herbals too - use a pillbox to sort medicine    Why is this important?   These steps will help you keep on track with your medicines.   Notes:       Patient stated (pt-stated)      Get off my cane and walk on my own again.        This is a list of the screening recommended for you and due dates:  Health Maintenance  Topic Date Due   COVID-19 Vaccine (3 - Moderna risk series) 10/27/2022*   Zoster (Shingles) Vaccine (1 of 2) 01/11/2023*   Flu Shot  03/17/2023*   Pneumonia Vaccine (3 - PPSV23 or PCV20) 10/12/2023*   Tetanus Vaccine  10/12/2023*   Medicare Annual Wellness Visit  11/11/2023   Mammogram  08/31/2024   Colon Cancer Screening  11/04/2031   DEXA scan (bone density measurement)  Completed   Hepatitis C Screening: USPSTF Recommendation to screen - Ages 1-79 yo.  Completed   HPV Vaccine  Aged Out  *Topic was postponed. The date shown is not the original due date.  Opioid Pain Medicine Management Opioids are powerful medicines that are used to treat moderate to severe pain. When used for short periods of time, they can help you to: Sleep better. Do better in physical or occupational therapy. Feel better in the first few days after an injury. Recover from surgery. Opioids should be taken with the supervision of a trained health care  provider. They should be taken for the shortest period of time possible. This is because opioids can be addictive, and the longer you take opioids, the greater your risk of addiction. This addiction can also be called opioid use disorder. What are the risks? Using opioid pain medicines for longer than 3 days increases your risk of side effects. Side effects include: Constipation. Nausea and vomiting. Breathing difficulties (respiratory depression). Drowsiness. Confusion. Opioid use disorder. Itching. Taking opioid pain medicine for a long period of time can affect your ability to do daily tasks. It also puts you at risk for: Motor vehicle crashes. Depression. Suicide. Heart attack. Overdose, which can be life-threatening. What is a pain treatment plan? A pain treatment plan is an agreement between you and your health care provider. Pain is unique to each person, and treatments vary depending on your condition. To manage your pain, you and your health care provider need to work together. To help you do this: Discuss the goals of your treatment, including how much pain  you might expect to have and how you will manage the pain. Review the risks and benefits of taking opioid medicines. Remember that a good treatment plan uses more than one approach and minimizes the chance of side effects. Be honest about the amount of medicines you take and about any drug or alcohol use. Get pain medicine prescriptions from only one health care provider. Pain can be managed with many types of alternative treatments. Ask your health care provider to refer you to one or more specialists who can help you manage pain through: Physical or occupational therapy. Counseling (cognitive behavioral therapy). Good nutrition. Biofeedback. Massage. Meditation. Non-opioid medicine. Following a gentle exercise program. How to use opioid pain medicine Taking medicine Take your pain medicine exactly as told by your  health care provider. Take it only when you need it. If your pain gets less severe, you may take less than your prescribed dose if your health care provider approves. If you are not having pain, do nottake pain medicine unless your health care provider tells you to take it. If your pain is severe, do nottry to treat it yourself by taking more pills than instructed on your prescription. Contact your health care provider for help. Write down the times when you take your pain medicine. It is easy to become confused while on pain medicine. Writing the time can help you avoid overdose. Take other over-the-counter or prescription medicines only as told by your health care provider. Keeping yourself and others safe  While you are taking opioid pain medicine: Do not drive, use machinery, or power tools. Do not sign legal documents. Do not drink alcohol. Do not take sleeping pills. Do not supervise children by yourself. Do not do activities that require climbing or being in high places. Do not go to a lake, river, ocean, spa, or swimming pool. Do not share your pain medicine with anyone. Keep pain medicine in a locked cabinet or in a secure area where pets and children cannot reach it. Stopping your use of opioids If you have been taking opioid medicine for more than a few weeks, you may need to slowly decrease (taper) how much you take until you stop completely. Tapering your use of opioids can decrease your risk of symptoms of withdrawal, such as: Pain and cramping in the abdomen. Nausea. Sweating. Sleepiness. Restlessness. Uncontrollable shaking (tremors). Cravings for the medicine. Do not attempt to taper your use of opioids on your own. Talk with your health care provider about how to do this. Your health care provider may prescribe a step-down schedule based on how much medicine you are taking and how long you have been taking it. Getting rid of leftover pills Do not save any leftover  pills. Get rid of leftover pills safely by: Taking the medicine to a prescription take-back program. This is usually offered by the county or law enforcement. Bringing them to a pharmacy that has a drug disposal container. Flushing them down the toilet. Check the label or package insert of your medicine to see whether this is safe to do. Throwing them out in the trash. Check the label or package insert of your medicine to see whether this is safe to do. If it is safe to throw it out, remove the medicine from the original container, put it into a sealable bag or container, and mix it with used coffee grounds, food scraps, dirt, or cat litter before putting it in the trash. Follow these instructions at home: Activity Do exercises  as told by your health care provider. Avoid activities that make your pain worse. Return to your normal activities as told by your health care provider. Ask your health care provider what activities are safe for you. General instructions You may need to take these actions to prevent or treat constipation: Drink enough fluid to keep your urine pale yellow. Take over-the-counter or prescription medicines. Eat foods that are high in fiber, such as beans, whole grains, and fresh fruits and vegetables. Limit foods that are high in fat and processed sugars, such as fried or sweet foods. Keep all follow-up visits. This is important. Where to find support If you have been taking opioids for a long time, you may benefit from receiving support for quitting from a local support group or counselor. Ask your health care provider for a referral to these resources in your area. Where to find more information Centers for Disease Control and Prevention (CDC): http://www.wolf.info/ [place] Food and Drug Administration (FDA): [place] Get help right away if: You may have taken too much of an opioid (overdosed). Common symptoms of an overdose: Your breathing is slower or more shallow than  normal. You have a very slow heartbeat (pulse). You have slurred speech. You have nausea and vomiting. Your pupils become very small. You have other potential symptoms: You are very confused. You faint or feel like you will faint. You have cold, clammy skin. You have blue lips or fingernails. You have thoughts of harming yourself or harming others. These symptoms may represent a serious problem that is an emergency. Do not wait to see if the symptoms will go away. Get medical help right away. Call your local emergency services (911 in the [place] Do not drive yourself to the hospital.  If you ever feel like you may hurt yourself or others, or have thoughts about taking your own life, get help right away. Go to your nearest emergency department or: Call your local emergency services (911 in the [place] Call the [facility] 8454998341 in the [place] Call a suicide crisis helpline, such as the [facility] at 709-097-2943 or 988 in the [place]. This is open 24 hours a day in the [place] Text the Crisis Text Line at 224-846-8247 (in the [place].). Summary Opioid medicines can help you manage moderate to severe pain for a short period of time. A pain treatment plan is an agreement between you and your health care provider. Discuss the goals of your treatment, including how much pain you might expect to have and how you will manage the pain. If you think that you or someone else may have taken too much of an opioid, get medical help right away. This information is not intended to replace advice given to you by your health care provider. Make sure you discuss any questions you have with your health care provider. Document Revised: 06/28/2021 Document Reviewed: 03/15/2021 Elsevier Patient Education  [place] directives: Advance directive discussed with you today. Even though you declined this today, please call our office should you change your  mind, and we can give you the proper paperwork for you to fill out.   Conditions/risks identified: None  Next appointment: Follow up in one year for your annual wellness visit     Preventive Care 65 Years and Older, Female Preventive care refers to lifestyle choices and visits with your health care provider that can promote health and wellness. What does preventive care include? A yearly physical exam.  This is also called an annual well check. Dental exams once or twice a year. Routine eye exams. Ask your health care provider how often you should have your eyes checked. Personal lifestyle choices, including: Daily care of your teeth and gums. Regular physical activity. Eating a healthy diet. Avoiding tobacco and drug use. Limiting alcohol use. Practicing safe sex. Taking low-dose aspirin every day. Taking vitamin and mineral supplements as recommended by your health care provider. What happens during an annual well check? The services and screenings done by your health care provider during your annual well check will depend on your age, overall health, lifestyle risk factors, and family history of disease. Counseling  Your health care provider may ask you questions about your: Alcohol use. Tobacco use. Drug use. Emotional well-being. Home and relationship well-being. Sexual activity. Eating habits. History of falls. Memory and ability to understand (cognition). Work and work Statistician. Reproductive health. Screening  You may have the following tests or measurements: Height, weight, and BMI. Blood pressure. Lipid and cholesterol levels. These may be checked every 5 years, or more frequently if you are over 29 years old. Skin check. Lung cancer screening. You may have this screening every year starting at age 23 if you have a 30-pack-year history of smoking and currently smoke or have quit within the past 15 years. Fecal occult blood test (FOBT) of the stool. You may have  this test every year starting at age 48. Flexible sigmoidoscopy or colonoscopy. You may have a sigmoidoscopy every 5 years or a colonoscopy every 10 years starting at age 44. Hepatitis C blood test. Hepatitis B blood test. Sexually transmitted disease (STD) testing. Diabetes screening. This is done by checking your blood sugar (glucose) after you have not eaten for a while (fasting). You may have this done every 1-3 years. Bone density scan. This is done to screen for osteoporosis. You may have this done starting at age 14. Mammogram. This may be done every 1-2 years. Talk to your health care provider about how often you should have regular mammograms. Talk with your health care provider about your test results, treatment options, and if necessary, the need for more tests. Vaccines  Your health care provider may recommend certain vaccines, such as: Influenza vaccine. This is recommended every year. Tetanus, diphtheria, and acellular pertussis (Tdap, Td) vaccine. You may need a Td booster every 10 years. Zoster vaccine. You may need this after age 29. Pneumococcal 13-valent conjugate (PCV13) vaccine. One dose is recommended after age 39. Pneumococcal polysaccharide (PPSV23) vaccine. One dose is recommended after age 55. Talk to your health care provider about which screenings and vaccines you need and how often you need them. This information is not intended to replace advice given to you by your health care provider. Make sure you discuss any questions you have with your health care provider. Document Released: 12/30/2015 Document Revised: 08/22/2016 Document Reviewed: 10/04/2015 Elsevier Interactive Patient Education  2017 [place] Prevention in the Home Falls can cause injuries. They can happen to people of all ages. There are many things you can do to make your home safe and to help prevent falls. What can I do on the outside of my home? Regularly fix the edges of walkways and  driveways and fix any cracks. Remove anything that might make you trip as you walk through a door, such as a raised step or threshold. Trim any bushes or trees on the path to your home. Use bright outdoor lighting. Clear  any walking paths of anything that might make someone trip, such as rocks or tools. Regularly check to see if handrails are loose or broken. Make sure that both sides of any steps have handrails. Any raised decks and porches should have guardrails on the edges. Have any leaves, snow, or ice cleared regularly. Use sand or salt on walking paths during winter. Clean up any spills in your garage right away. This includes oil or grease spills. What can I do in the bathroom? Use night lights. Install grab bars by the toilet and in the tub and shower. Do not use towel bars as grab bars. Use non-skid mats or decals in the tub or shower. If you need to sit down in the shower, use a plastic, non-slip stool. Keep the floor dry. Clean up any water that spills on the floor as soon as it happens. Remove soap buildup in the tub or shower regularly. Attach bath mats securely with double-sided non-slip rug tape. Do not have throw rugs and other things on the floor that can make you trip. What can I do in the bedroom? Use night lights. Make sure that you have a light by your bed that is easy to reach. Do not use any sheets or blankets that are too big for your bed. They should not hang down onto the floor. Have a firm chair that has side arms. You can use this for support while you get dressed. Do not have throw rugs and other things on the floor that can make you trip. What can I do in the kitchen? Clean up any spills right away. Avoid walking on wet floors. Keep items that you use a lot in easy-to-reach places. If you need to reach something above you, use a strong step stool that has a grab bar. Keep electrical cords out of the way. Do not use floor polish or wax that makes floors  slippery. If you must use wax, use non-skid floor wax. Do not have throw rugs and other things on the floor that can make you trip. What can I do with my stairs? Do not leave any items on the stairs. Make sure that there are handrails on both sides of the stairs and use them. Fix handrails that are broken or loose. Make sure that handrails are as long as the stairways. Check any carpeting to make sure that it is firmly attached to the stairs. Fix any carpet that is loose or worn. Avoid having throw rugs at the top or bottom of the stairs. If you do have throw rugs, attach them to the floor with carpet tape. Make sure that you have a light switch at the top of the stairs and the bottom of the stairs. If you do not have them, ask someone to add them for you. What else can I do to help prevent falls? Wear shoes that: Do not have high heels. Have rubber bottoms. Are comfortable and fit you well. Are closed at the toe. Do not wear sandals. If you use a stepladder: Make sure that it is fully opened. Do not climb a closed stepladder. Make sure that both sides of the stepladder are locked into place. Ask someone to hold it for you, if possible. Clearly mark and make sure that you can see: Any grab bars or handrails. First and last steps. Where the edge of each step is. Use tools that help you move around (mobility aids) if they are needed. These include: Canes. Walkers. Scooters.  Crutches. Turn on the lights when you go into a dark area. Replace any light bulbs as soon as they burn out. Set up your furniture so you have a clear path. Avoid moving your furniture around. If any of your floors are uneven, fix them. If there are any pets around you, be aware of where they are. Review your medicines with your doctor. Some medicines can make you feel dizzy. This can increase your chance of falling. Ask your doctor what other things that you can do to help prevent falls. This information is not  intended to replace advice given to you by your health care provider. Make sure you discuss any questions you have with your health care provider. Document Released: 09/29/2009 Document Revised: 05/10/2016 Document Reviewed: 01/07/2015 Elsevier Interactive Patient Education  2017 [employer].

## 2022-10-11 NOTE — Progress Notes (Signed)
Subjective:   Morgan [name] is a 71 y.o. female who presents for Medicare Annual (Subsequent) preventive examination.  Review of Systems    Virtual Visit via Telephone Note  I connected with  Morgan [name] on 10/11/22 at  8:15 AM EDT by telephone and verified that I am speaking with the correct person using two identifiers.  Location: Patient: Home Provider: Office Persons participating in the virtual visit: patient/Nurse Health Advisor   I discussed the limitations, risks, security and privacy concerns of performing an evaluation and management service by telephone and the availability of in person appointments. The patient expressed understanding and agreed to proceed.  Interactive audio and video telecommunications were attempted between this nurse and patient, however failed, due to patient having technical difficulties OR patient did not have access to video capability.  We continued and completed visit with audio only.  Some vital signs may be absent or patient reported.   Criselda Peaches, LPN  Cardiac Risk Factors include: advanced age (>55mn, >>60women);hypertension;Other (see comment), Risk factor comments: CHF     Objective:    Today's Vitals   10/11/22 0819  Weight: 183 lb (83 kg)  Height: '6\' 1"'$  (1.854 m)   Body mass index is 24.14 kg/m.     10/11/2022    8:27 AM 09/23/2022   11:06 AM 09/21/2022   12:49 PM 09/18/2022    1:13 PM 09/09/2022    5:54 PM 08/08/2022   11:09 AM 08/03/2022    3:51 PM  Advanced Directives  Does Patient Have a Medical Advance Directive? No No No No No Yes Yes  Type of Advance Directive      Out of facility DNR (pink MOST or yellow form) Out of facility DNR (pink MOST or yellow form)  Does patient want to make changes to medical advance directive?      No - Guardian declined No - Patient declined  Would patient like information on creating a medical advance directive? No - Patient declined No - Patient declined No - Patient declined  No  - Patient declined    Pre-existing out of facility DNR order (yellow form or pink MOST form)       Yellow form placed in chart (order not valid for inpatient use)    Current Medications (verified) Outpatient Encounter Medications as of 10/11/2022  Medication Sig   acetaminophen (TYLENOL) 325 MG tablet Take 2 tablets (650 mg total) by mouth every 4 (four) hours as needed for mild pain (or temp > 37.5 C (99.5 F)).   carbidopa-levodopa (SINEMET IR) 25-100 MG tablet Take 2 tablets by mouth 3 (three) times daily.   erythromycin (E-MYCIN) 250 MG tablet Take one tablet three times daily prior to meals for max four weeks   hydrOXYzine (ATARAX) 25 MG tablet Take 1 tablet (25 mg total) by mouth 2 (two) times daily as needed for anxiety or nausea.   ibuprofen (ADVIL) 400 MG tablet Take 1 tablet (400 mg total) by mouth every 8 (eight) hours as needed for headache or moderate pain.   ondansetron (ZOFRAN-ODT) 4 MG disintegrating tablet Take 1 tablet (4 mg total) by mouth every 8 (eight) hours as needed for nausea or vomiting.   oxyCODONE (ROXICODONE) 5 MG immediate release tablet Take 1 tablet (5 mg total) by mouth every 8 (eight) hours as needed for moderate pain.   OXYGEN Inhale 3 L/min into the lungs See admin instructions. 3 L/min at bedtime and as needed for shortness of breath  throughout the day   potassium chloride SA (KLOR-CON M) 20 MEQ tablet Take 1 tablet (20 mEq total) by mouth daily.   promethazine (PHENERGAN) 12.5 MG tablet Take 1 tablet (12.5 mg total) by mouth every 12 (twelve) hours as needed for nausea or vomiting.   topiramate (TOPAMAX) 50 MG tablet Take 1 tablet (50 mg total) by mouth at bedtime.   No facility-administered encounter medications on file as of 10/11/2022.    Allergies (verified) Meperidine, Strawberry extract, Sucralfate, and Wasp venom   History: Past Medical History:  Diagnosis Date   Allergy    Anemia    Anxiety    Arthritis    CHF (congestive heart failure)  (HCC)    COPD (chronic obstructive pulmonary disease) (HCC)    Depression    GERD (gastroesophageal reflux disease)    Hypertension    MVP (mitral valve prolapse)    Parkinson disease    Past Surgical History:  Procedure Laterality Date   ABDOMINAL HYSTERECTOMY     APPENDECTOMY     BALLOON DILATION N/A 04/21/2020   Procedure: BALLOON DILATION;  Surgeon: Milus Banister, MD;  Location: [facility];  Service: Endoscopy;  Laterality: N/A;  pyloric/ GI   BALLOON DILATION N/A 07/04/2020   Procedure: BALLOON DILATION;  Surgeon: Mauri Pole, MD;  Location: [facility];  Service: Endoscopy;  Laterality: N/A;   BIOPSY  04/21/2020   Procedure: BIOPSY;  Surgeon: Milus Banister, MD;  Location: [facility];  Service: Endoscopy;;   BIOPSY  07/04/2020   Procedure: BIOPSY;  Surgeon: Mauri Pole, MD;  Location: [facility];  Service: Endoscopy;;  EGD and COLON   BREAST SURGERY Right 1988   tumor removal   CHOLECYSTECTOMY N/A 05/18/2020   Procedure: LAPAROSCOPIC CHOLECYSTECTOMY;  Surgeon: Johnathan Hausen, MD;  Location: [facility];  Service: General;  Laterality: N/A;   COLONOSCOPY WITH PROPOFOL N/A 07/04/2020   Procedure: COLONOSCOPY WITH PROPOFOL;  Surgeon: Mauri Pole, MD;  [facility];  Service: Endoscopy;  Laterality: N/A;   COLONOSCOPY WITH PROPOFOL N/A 11/03/2021   Procedure: COLONOSCOPY WITH PROPOFOL;  Surgeon: Lesly Rubenstein, MD;  Location: [facility];  Service: Endoscopy;  Laterality: N/A;   ESOPHAGEAL DILATION  08/11/2020   Procedure: PYLORIC DILATION;  Surgeon: Doran Stabler, MD;  Location: [facility];  Service: Gastroenterology;;   ESOPHAGOGASTRODUODENOSCOPY (EGD) WITH PROPOFOL N/A 04/21/2020   Procedure: ESOPHAGOGASTRODUODENOSCOPY (EGD) WITH PROPOFOL;  Surgeon: Milus Banister, MD;  [facility];  Service: Endoscopy;  Laterality: N/A;   ESOPHAGOGASTRODUODENOSCOPY (EGD) WITH PROPOFOL N/A 07/04/2020   Procedure: ESOPHAGOGASTRODUODENOSCOPY  (EGD) WITH PROPOFOL;  Surgeon: Mauri Pole, MD;  [facility];  Service: Endoscopy;  Laterality: N/A;   ESOPHAGOGASTRODUODENOSCOPY (EGD) WITH PROPOFOL N/A 08/11/2020   Procedure: ESOPHAGOGASTRODUODENOSCOPY (EGD) WITH PROPOFOL;  Surgeon: Doran Stabler, MD;  [facility];  Service: Gastroenterology;  Laterality: N/A;   ESOPHAGOGASTRODUODENOSCOPY (EGD) WITH PROPOFOL N/A 11/03/2021   Procedure: ESOPHAGOGASTRODUODENOSCOPY (EGD) WITH PROPOFOL;  Surgeon: Lesly Rubenstein, MD;  Location: [facility];  Service: Endoscopy;  Laterality: N/A;   ESOPHAGOGASTRODUODENOSCOPY (EGD) WITH PROPOFOL N/A 05/07/2022   Procedure: ESOPHAGOGASTRODUODENOSCOPY (EGD) WITH PROPOFOL;  Surgeon: Lin Landsman, MD;  Location: [facility];  Service: Gastroenterology;  Laterality: N/A;   KNEE SURGERY  2005   2 times left   KNEE SURGERY Right    KYPHOPLASTY N/A 07/20/2021   Procedure: T 10KYPHOPLASTY;  Surgeon: Hessie Knows, MD;  Location: [facility];  Service: Orthopedics;  Laterality: N/A;  KYPHOPLASTY N/A 08/23/2021   Procedure: T9 KYPHOPLASTY;  Surgeon: Hessie Knows, MD;  Location: [facility];  Service: Orthopedics;  Laterality: N/A;   POLYPECTOMY  07/04/2020   Procedure: POLYPECTOMY;  Surgeon: Mauri Pole, MD;  [facility];  Service: Endoscopy;;   TUBAL LIGATION     Family History  Problem Relation Age of Onset   Breast cancer Mother 38   Ovarian cancer Mother 15   Brain cancer Mother    Hypertension Father    Hyperlipidemia Father    Heart disease Father    Colon cancer Father        diagnosed in his 25s   Breast cancer Maternal Grandmother 77   Diabetes Paternal Grandmother    Breast cancer Paternal Grandmother 21   Other Son        drug over dose   Colon cancer Maternal Grandfather        dx in his 77s   Liver disease Maternal Grandfather    Breast cancer Maternal Aunt 60   Breast cancer Maternal Aunt 60   Colon cancer Maternal Uncle    Stomach  cancer Neg Hx    Pancreatic cancer Neg Hx    Esophageal cancer Neg Hx    Social History   Socioeconomic History   Marital status: Single    Spouse name: Not on file   Number of children: 2   Years of education: Not on file   Highest education level: Not on file  Occupational History   Not on file  Tobacco Use   Smoking status: Former    Packs/day: 1.00    Years: 15.00    Total pack years: 15.00    Types: Cigarettes    Quit date: 09/12/2017    Years since quitting: 5.0   Smokeless tobacco: Never  Vaping Use   Vaping Use: Never used  Substance and Sexual Activity   Alcohol use: Not Currently   Drug use: No   Sexual activity: Not Currently  Other Topics Concern   Not on file  Social History Narrative   Lives with Grandson's great grandparents    27 - 65 years old ([place])   Daughter - [name] - lives in the [place] - family, aunt, [name] and [name] (who she lives with), brother   Transportation: roommates   Enjoy: [employer] channel, playing with grandson   Exercise: PT exercises   Diet: good - chicken, some steak, tries to eat healthy, avoids fried foods   Retired from being a Quarry manager   Social Determinants of Radio broadcast assistant Strain: [place]  (10/11/2022)   Overall Financial Resource Strain (CARDIA)    Difficulty of Paying Living Expenses: Not hard at all  Food Insecurity: No Food Insecurity (10/11/2022)   Hunger Vital Sign    Worried About Running Out of Food in the Last Year: Never true    Ran Out of Food in the Last Year: Never true  Transportation Needs: No Transportation Needs (10/11/2022)   PRAPARE - Hydrologist (Medical): No    Lack of Transportation (Non-Medical): No  Physical Activity: Inactive (10/11/2022)   Exercise Vital Sign    Days of Exercise per Week: 0 days    Minutes of Exercise per Session: 0 min  Stress: No Stress Concern Present (10/11/2022)   [place]    Feeling of Stress : Not at all  Social Connections: Moderately  Integrated (10/11/2022)   Social Connection and Isolation Panel [NHANES]    Frequency of Communication with Friends and Family: More than three times a week    Frequency of Social Gatherings with Friends and Family: More than three times a week    Attends Religious Services: More than 4 times per year    Active Member of [employer] or Organizations: Yes    Attends Music therapist: More than 4 times per year    Marital Status: Divorced    Tobacco Counseling Counseling given: Not Answered   Clinical Intake:  Pre-visit preparation completed: No  Pain : No/denies pain     BMI - recorded: 24.14 Nutritional Status: BMI of 19-24  Normal Nutritional Risks: None Diabetes: No  How often do you need to have someone help you when you read instructions, pamphlets, or other written materials from your doctor or pharmacy?: 1 - Never  Diabetic?  No  Interpreter Needed?: No  Information entered by :: Rolene Arbour LPN   Activities of Daily Living    10/11/2022    8:25 AM 09/09/2022    5:57 PM  In your present state of health, do you have any difficulty performing the following activities:  Hearing? 0 0  Vision? 0 0  Difficulty concentrating or making decisions? 0 0  Walking or climbing stairs? 0 0  Dressing or bathing? 0 0  Doing errands, shopping? 0 0  Preparing Food and eating ? N   Using the Toilet? N   In the past six months, have you accidently leaked urine? N   Do you have problems with loss of bowel control? N   Managing your Medications? N   Managing your Finances? N   Housekeeping or managing your Housekeeping? N     Patient Care Team: Eugenia Pancoast, FNP as PCP - General (Family Medicine) Charlton Haws, [facility] as Pharmacist (Pharmacist) [name], MD as Consulting Physician (Hematology and Oncology) Vladimir Crofts, MD as Consulting Physician  (Neurology)  Indicate any recent Medical Services you may have received from other than [facility] providers in the past year (date may be approximate).     Assessment:   This is a routine wellness examination for Ercel.  Hearing/Vision screen Hearing Screening - Comments:: Denies hearing difficulties   Vision Screening - Comments:: Wears rx Contacts - up to date with routine eye exams with [place] issues and exercise activities discussed: Current Exercise Habits: The patient does not participate in regular exercise at present, Exercise limited by: None identified   Goals Addressed               This Visit's Progress     Patient stated (pt-stated)        Get off my cane and walk on my own again.       Depression Screen    10/11/2022    8:24 AM 09/27/2022    9:29 AM 09/06/2022   10:16 AM 10/17/2021    2:18 PM 08/28/2021    3:52 PM 11/12/2018    8:56 AM  PHQ 2/9 Scores  PHQ - 2 Score 0 6 4 0 2 0  PHQ- 9 Score 0 '17 14  8     '$ Fall Risk    10/11/2022    8:26 AM 10/17/2021    2:17 PM 08/28/2021    3:42 PM 11/12/2018    8:56 AM  Fall Risk   Falls in the past year? 1 0 1 1  Number falls in past yr: 0 0 1 1  Injury with Fall? 0 0 1 0  Follow up Falls prevention discussed Falls evaluation completed      FALL RISK PREVENTION PERTAINING TO THE HOME:  Any stairs in or around the home? Yes  If so, are there any without handrails? No  Home free of loose throw rugs in walkways, pet beds, electrical cords, etc? Yes  Adequate lighting in your home to reduce risk of falls? Yes   ASSISTIVE DEVICES UTILIZED TO PREVENT FALLS:  Life alert? No  Use of a cane, walker or w/c? Yes  Grab bars in the bathroom? Yes  Shower chair or bench in shower? Yes  Elevated toilet seat or a handicapped toilet? Yes   TIMED UP AND GO:  Was the test performed? No . Audio Visit   Cognitive Function:        10/11/2022    8:27 AM  6CIT Screen  What Year? 0 points  What  month? 0 points  What time? 0 points  Count back from 20 0 points  Months in reverse 0 points  Repeat phrase 0 points  Total Score 0 points    Immunizations Immunization History  Administered Date(s) Administered   Fluad Quad(high Dose 65+) 09/30/2019, 08/29/2020, 08/28/2021   Influenza, High Dose Seasonal PF 08/23/2018   Influenza,inj,Quad PF,6+ Mos 02/24/2015   Moderna Sars-Covid-2 Vaccination 03/18/2020, 04/15/2020   Pneumococcal Conjugate-13 11/12/2018   Pneumococcal Polysaccharide-23 02/24/2015    TDAP status: Due, Education has been provided regarding the importance of this vaccine. Advised may receive this vaccine at local pharmacy or Health Dept. Aware to provide a copy of the vaccination record if obtained from local pharmacy or Health Dept. Verbalized acceptance and understanding.  Flu Vaccine status: Due, Education has been provided regarding the importance of this vaccine. Advised may receive this vaccine at local pharmacy or Health Dept. Aware to provide a copy of the vaccination record if obtained from local pharmacy or Health Dept. Verbalized acceptance and understanding.  Pneumococcal vaccine status: Due, Education has been provided regarding the importance of this vaccine. Advised may receive this vaccine at local pharmacy or Health Dept. Aware to provide a copy of the vaccination record if obtained from local pharmacy or Health Dept. Verbalized acceptance and understanding.  Covid-19 vaccine status: Completed vaccines  Qualifies for Shingles Vaccine? Yes   Zostavax completed No   Shingrix Completed?: No.    Education has been provided regarding the importance of this vaccine. Patient has been advised to call insurance company to determine out of pocket expense if they have not yet received this vaccine. Advised may also receive vaccine at local pharmacy or Health Dept. Verbalized acceptance and understanding.  Screening Tests Health Maintenance  Topic Date Due    COVID-19 Vaccine (3 - Moderna risk series) 10/27/2022 (Originally 05/13/2020)   Zoster Vaccines- Shingrix (1 of 2) 01/11/2023 (Originally 06/20/1970)   INFLUENZA VACCINE  03/17/2023 (Originally 07/17/2022)   Pneumonia Vaccine 69+ Years old (3 - PPSV23 or PCV20) 10/12/2023 (Originally 02/24/2020)   TETANUS/TDAP  10/12/2023 (Originally 06/20/1970)   Medicare Annual Wellness (AWV)  11/11/2023   MAMMOGRAM  08/31/2024   COLONOSCOPY (Pts 45-28yr Insurance coverage will need to be confirmed)  11/04/2031   DEXA SCAN  Completed   Hepatitis C Screening  Completed   HPV VACCINES  Aged Out    Health Maintenance  There are no preventive care reminders to display for this patient.   Colorectal cancer screening: Type of  screening: Colonoscopy. Completed 11/03/21. Repeat every 10 years  Mammogram status: Completed 08/31/22. Repeat every year  Bone Density status: Completed 08/10/21. Results reflect: Bone density results: OSTEOPOROSIS. Repeat every   years.  Lung Cancer Screening: (Low Dose CT Chest recommended if Age 32-80 years, 30 pack-year currently smoking OR have quit w/in 15years.) does not qualify.     Additional Screening:  Hepatitis C Screening: does qualify; Completed 09/24/17  Vision Screening: Recommended annual ophthalmology exams for early detection of glaucoma and other disorders of the eye. Is the patient up to date with their annual eye exam?  Yes  Who is the provider or what is the name of the office in which the patient attends annual eye exams? [place] If pt is not established with a provider, would they like to be referred to a provider to establish care? No .   Dental Screening: Recommended annual dental exams for proper oral hygiene  Community Resource Referral / Chronic Care Management:  CRR required this visit?  No   CCM required this visit?  No      Plan:     I have personally reviewed and noted the following in the patient's chart:   Medical and social  history Use of alcohol, tobacco or illicit drugs  Current medications and supplements including opioid prescriptions. Patient is currently taking opioid prescriptions. Information provided to patient regarding non-opioid alternatives. Patient advised to discuss non-opioid treatment plan with their provider. Functional ability and status Nutritional status Physical activity Advanced directives List of other physicians Hospitalizations, surgeries, and ER visits in previous 12 months Vitals Screenings to include cognitive, depression, and falls Referrals and appointments  In addition, I have reviewed and discussed with patient certain preventive protocols, quality metrics, and best practice recommendations. A written personalized care plan for preventive services as well as general preventive health recommendations were provided to patient.     Criselda Peaches, LPN   80/32/1224   Nurse Notes: None

## 2022-10-13 IMAGING — DX DG THORACIC SPINE 4+V
3 series · 3 of 3 positions shown · non-contrast
Comparison: Chest CT dated 02/20/2021.

CLINICAL DATA: 70-year-old female with fall and back pain.

EXAM:
THORACIC SPINE - 4+ VIEW

[t-spine ap]
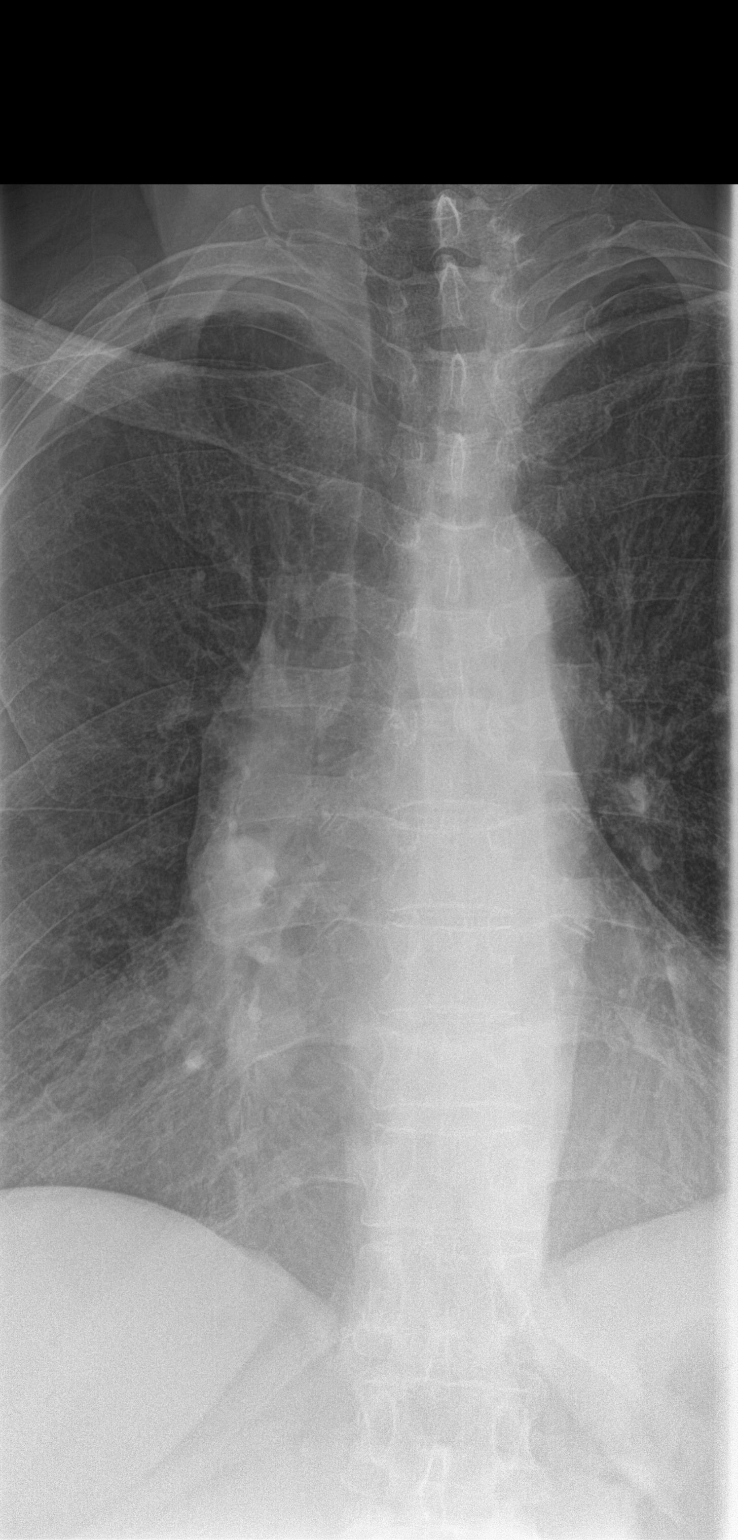

[t-spine lat]
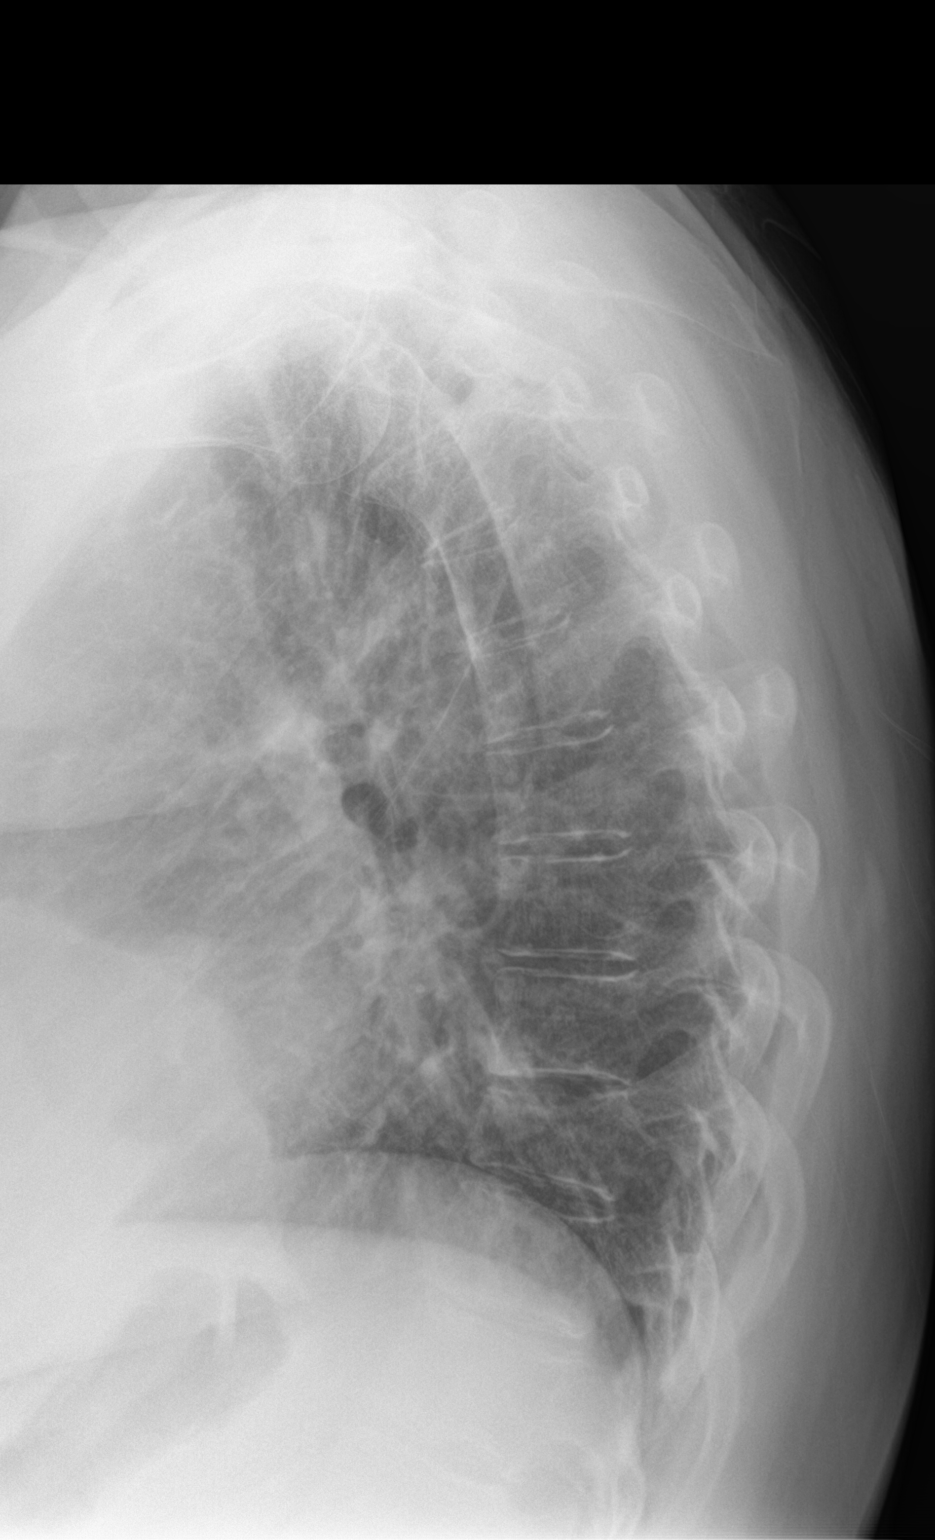

[t-spine swimmers]
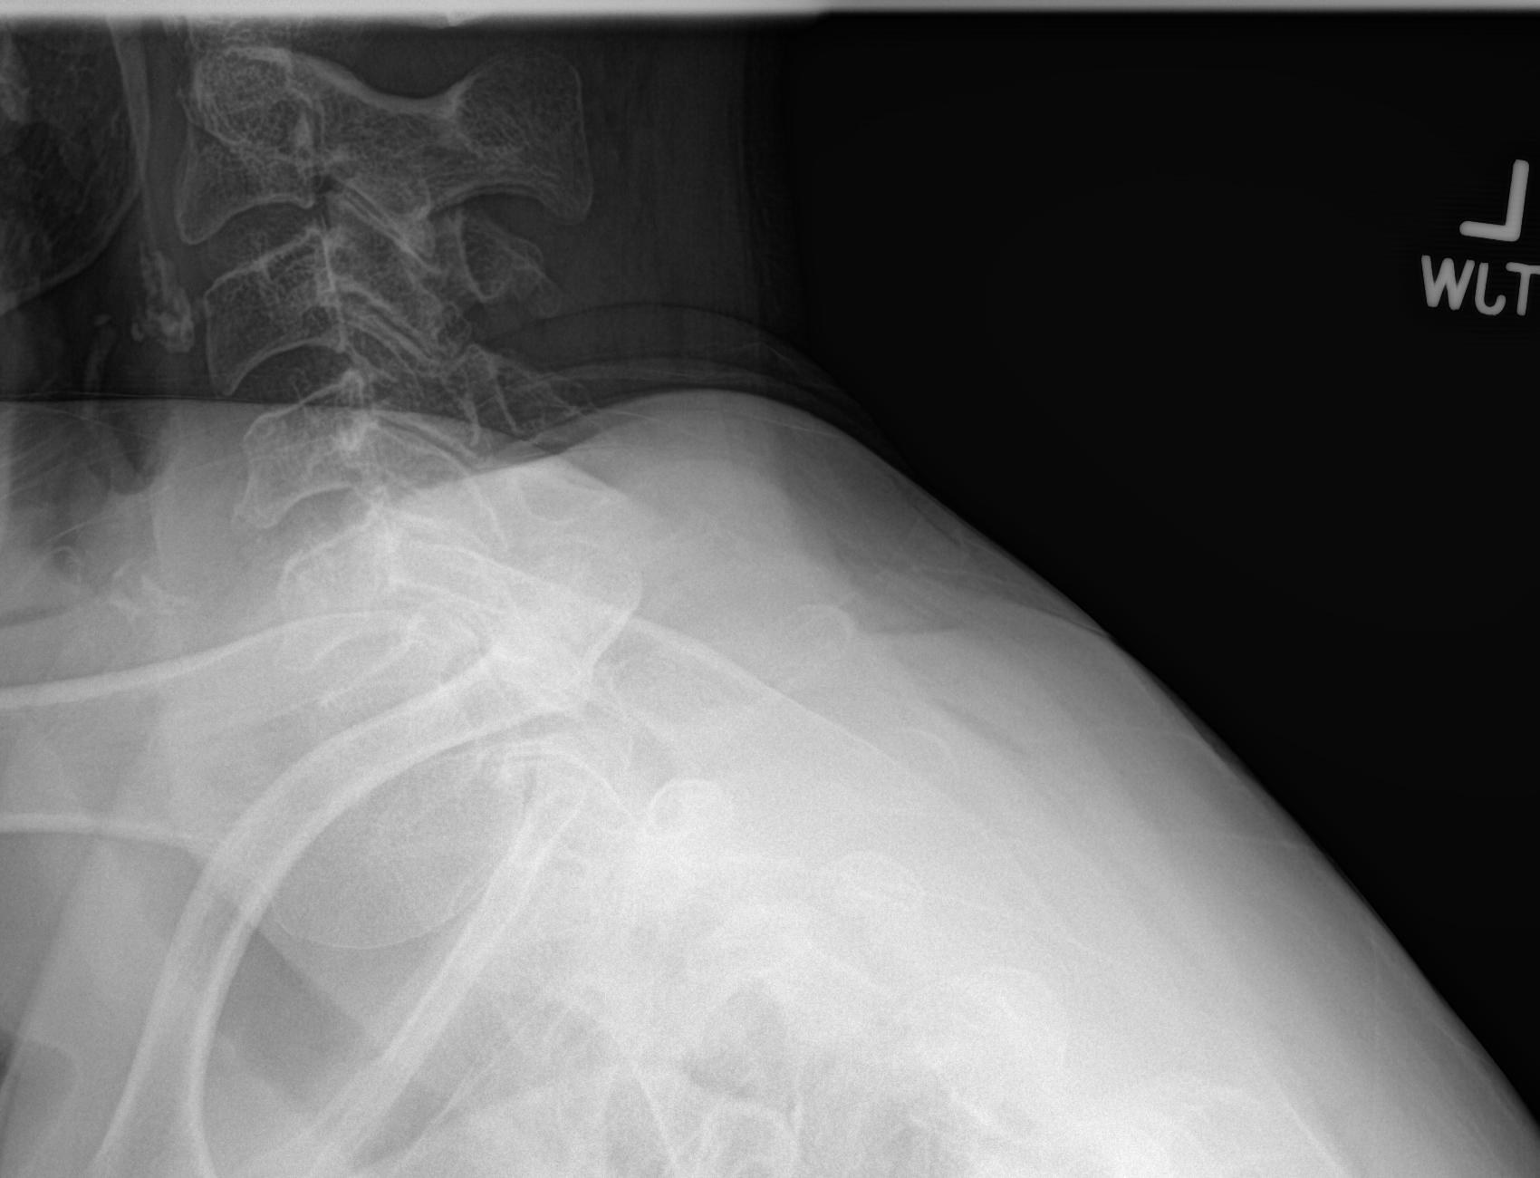

[3 of 3 positions shown; findings below may reference images not displayed]

FINDINGS: There is mild compression fracture of the lower thoracic spine,
likely involving the T10 which is new since the CT of 02/20/2021 and
concerning for an acute or subacute fracture. Correlation with
clinical exam and point tenderness recommended. CT or MRI may
provide better evaluation. No definite retropulsion. The bones are
osteopenic. No subluxation. The soft tissues are unremarkable.
IMPRESSION: Mild compression fracture of T10 vertebral body, likely cute or
subacute. Clinical correlation is recommended.

## 2022-10-17 ENCOUNTER — Ambulatory Visit (INDEPENDENT_AMBULATORY_CARE_PROVIDER_SITE_OTHER): Payer: Medicare HMO | Admitting: Family

## 2022-10-17 ENCOUNTER — Encounter: Payer: Self-pay | Admitting: Family

## 2022-10-17 VITALS — BP 118/78 | HR 101 | Temp 98.2°F | Resp 16 | Ht 73.0 in | Wt 175.1 lb

## 2022-10-17 DIAGNOSIS — E876 Hypokalemia: Secondary | ICD-10-CM | POA: Diagnosis not present

## 2022-10-17 DIAGNOSIS — R82998 Other abnormal findings in urine: Secondary | ICD-10-CM

## 2022-10-17 DIAGNOSIS — R197 Diarrhea, unspecified: Secondary | ICD-10-CM | POA: Insufficient documentation

## 2022-10-17 DIAGNOSIS — R112 Nausea with vomiting, unspecified: Secondary | ICD-10-CM | POA: Diagnosis not present

## 2022-10-17 DIAGNOSIS — R1013 Epigastric pain: Secondary | ICD-10-CM | POA: Diagnosis not present

## 2022-10-17 MED ORDER — PROMETHAZINE HCL 12.5 MG PO TABS: 12.5000 mg | ORAL_TABLET | Freq: Two times a day (BID) | ORAL | 0 refills | Status: DC | PRN

## 2022-10-17 NOTE — Progress Notes (Signed)
Established Patient Office Visit  Subjective:  Patient ID: Morgan [name], female    DOB: 21-Feb-1951  Age: 71 y.o. MRN: [id number]  CC:  Chief Complaint  Patient presents with   Diarrhea    X 6 days   Emesis    HPI Morgan [name] is here today with concerns.   Has appt with GI 10/25/22.   About six days ago started with diarrhea that is now getting worse.  She is even now vomiting even more than prior. She has had chills on and off but not fever.  She started dayquil/nyquil without relief. No sore throat no ear pain, just feels tired and miserable.  She states diarrhea about 6 x a day, prior to this onset was about 3-4 times a day.  Throwing up 3-4 times a day as well.   Only able to tolerate soup, and still losing weight.   Wt Readings from Last 3 Encounters:  10/17/22 175 lb 2 oz (79.4 kg)  10/11/22 183 lb (83 kg)  10/02/22 183 lb (83 kg)   Recently saw vascular 10/17 for renal artery stenosis. Has f/u in december for repeat scan, however vascular does not feel this would be causing her abd pain.   Las were ordered 10/6 of which are not yet collected, we will collect today. This is to r/o celiac disease   Past Medical History:  Diagnosis Date   Allergy    Anemia    Anxiety    Arthritis    CHF (congestive heart failure) (HCC)    COPD (chronic obstructive pulmonary disease) (HCC)    Depression    GERD (gastroesophageal reflux disease)    Hypertension    MVP (mitral valve prolapse)    Parkinson disease     Past Surgical History:  Procedure Laterality Date   ABDOMINAL HYSTERECTOMY     APPENDECTOMY     BALLOON DILATION N/A 04/21/2020   Procedure: BALLOON DILATION;  Surgeon: Milus Banister, MD;  Location: [facility];  Service: Endoscopy;  Laterality: N/A;  pyloric/ GI   BALLOON DILATION N/A 07/04/2020   Procedure: BALLOON DILATION;  Surgeon: Mauri Pole, MD;  Location: [facility];  Service: Endoscopy;  Laterality: N/A;   BIOPSY  04/21/2020   Procedure:  BIOPSY;  Surgeon: Milus Banister, MD;  Location: [facility];  Service: Endoscopy;;   BIOPSY  07/04/2020   Procedure: BIOPSY;  Surgeon: Mauri Pole, MD;  Location: [facility];  Service: Endoscopy;;  EGD and COLON   BREAST SURGERY Right 1988   tumor removal   CHOLECYSTECTOMY N/A 05/18/2020   Procedure: LAPAROSCOPIC CHOLECYSTECTOMY;  Surgeon: Johnathan Hausen, MD;  Location: [facility];  Service: General;  Laterality: N/A;   COLONOSCOPY WITH PROPOFOL N/A 07/04/2020   Procedure: COLONOSCOPY WITH PROPOFOL;  Surgeon: Mauri Pole, MD;  Location: [facility];  Service: Endoscopy;  Laterality: N/A;   COLONOSCOPY WITH PROPOFOL N/A 11/03/2021   Procedure: COLONOSCOPY WITH PROPOFOL;  Surgeon: Lesly Rubenstein, MD;  Location: [facility];  Service: Endoscopy;  Laterality: N/A;   ESOPHAGEAL DILATION  08/11/2020   Procedure: PYLORIC DILATION;  Surgeon: Doran Stabler, MD;  Location: [facility];  Service: Gastroenterology;;   ESOPHAGOGASTRODUODENOSCOPY (EGD) WITH PROPOFOL N/A 04/21/2020   Procedure: ESOPHAGOGASTRODUODENOSCOPY (EGD) WITH PROPOFOL;  Surgeon: Milus Banister, MD;  [facility]: [facility];  Service: Endoscopy;  Laterality: N/A;   ESOPHAGOGASTRODUODENOSCOPY (EGD) WITH PROPOFOL N/A 07/04/2020   Procedure: ESOPHAGOGASTRODUODENOSCOPY (EGD) WITH PROPOFOL;  Surgeon: Mauri Pole, MD;  Location: [facility];  Service: Endoscopy;  Laterality: N/A;   ESOPHAGOGASTRODUODENOSCOPY (EGD) WITH PROPOFOL N/A 08/11/2020   Procedure: ESOPHAGOGASTRODUODENOSCOPY (EGD) WITH PROPOFOL;  Surgeon: Doran Stabler, MD;  Location: [facility];  Service: Gastroenterology;  Laterality: N/A;   ESOPHAGOGASTRODUODENOSCOPY (EGD) WITH PROPOFOL N/A 11/03/2021   Procedure: ESOPHAGOGASTRODUODENOSCOPY (EGD) WITH PROPOFOL;  Surgeon: Lesly Rubenstein, MD;  Location: [facility];  Service: Endoscopy;  Laterality: N/A;   ESOPHAGOGASTRODUODENOSCOPY (EGD) WITH PROPOFOL N/A 05/07/2022   Procedure:  ESOPHAGOGASTRODUODENOSCOPY (EGD) WITH PROPOFOL;  Surgeon: Lin Landsman, MD;  Location: [facility];  Service: Gastroenterology;  Laterality: N/A;   KNEE SURGERY  2005   2 times left   KNEE SURGERY Right    KYPHOPLASTY N/A 07/20/2021   Procedure: T 10KYPHOPLASTY;  Surgeon: Hessie Knows, MD;  Location: [facility];  Service: Orthopedics;  Laterality: N/A;   KYPHOPLASTY N/A 08/23/2021   Procedure: T9 KYPHOPLASTY;  Surgeon: Hessie Knows, MD;  Location: [facility];  Service: Orthopedics;  Laterality: N/A;   POLYPECTOMY  07/04/2020   Procedure: POLYPECTOMY;  Surgeon: Mauri Pole, MD;  Location: [facility];  Service: Endoscopy;;   TUBAL LIGATION      Family History  Problem Relation Age of Onset   Breast cancer Mother 28   Ovarian cancer Mother 84   Brain cancer Mother    Hypertension Father    Hyperlipidemia Father    Heart disease Father    Colon cancer Father        diagnosed in his 57s   Breast cancer Maternal Grandmother 32   Diabetes Paternal Grandmother    Breast cancer Paternal Grandmother 71   Other Son        drug over dose   Colon cancer Maternal Grandfather        dx in his 70s   Liver disease Maternal Grandfather    Breast cancer Maternal Aunt 60   Breast cancer Maternal Aunt 60   Colon cancer Maternal Uncle    Stomach cancer Neg Hx    Pancreatic cancer Neg Hx    Esophageal cancer Neg Hx     Social History   Socioeconomic History   Marital status: Single    Spouse name: Not on file   Number of children: 2   Years of education: Not on file   Highest education level: Not on file  Occupational History   Not on file  Tobacco Use   Smoking status: Former    Packs/day: 1.00    Years: 15.00    Total pack years: 15.00    Types: Cigarettes    Quit date: 09/12/2017    Years since quitting: 5.1   Smokeless tobacco: Never  Vaping Use   Vaping Use: Never used  Substance and Sexual Activity   Alcohol use: Not Currently   Drug use: No   Sexual  activity: Not Currently  Other Topics Concern   Not on file  Social History Narrative   Lives with Grandson's great grandparents    [name] - 32 years old ([place])   Daughter - [name] - lives in the [place] system - family, aunt, [name] and [name] (who she lives with), brother   Transportation: roommates   Enjoy: [employer], playing with grandson   Exercise: PT exercises   Diet: good - chicken, some steak, tries to eat healthy, avoids fried foods   Retired from being a Quarry manager   Social Determinants of Radio broadcast assistant Strain: Low Risk  (10/11/2022)  Overall Financial Resource Strain (CARDIA)    Difficulty of Paying Living Expenses: Not hard at all  Food Insecurity: No Food Insecurity (10/11/2022)   Hunger Vital Sign    Worried About Running Out of Food in the Last Year: Never true    Ran Out of Food in the Last Year: Never true  Transportation Needs: No Transportation Needs (10/11/2022)   PRAPARE - Hydrologist (Medical): No    Lack of Transportation (Non-Medical): No  Physical Activity: Inactive (10/11/2022)   Exercise Vital Sign    Days of Exercise per Week: 0 days    Minutes of Exercise per Session: 0 min  Stress: No Stress Concern Present (10/11/2022)   [facility]    Feeling of Stress : Not at all  Social Connections: Moderately Integrated (10/11/2022)   Social Connection and Isolation Panel [NHANES]    Frequency of Communication with Friends and Family: More than three times a week    Frequency of Social Gatherings with Friends and Family: More than three times a week    Attends Religious Services: More than 4 times per year    Active Member of [employer] or Organizations: Yes    Attends Music therapist: More than 4 times per year    Marital Status: Divorced  Intimate Partner Violence: Not At Risk (10/11/2022)   Humiliation, Afraid, Rape, and  Kick questionnaire    Fear of Current or Ex-Partner: No    Emotionally Abused: No    Physically Abused: No    Sexually Abused: No    Outpatient Medications Prior to Visit  Medication Sig Dispense Refill   acetaminophen (TYLENOL) 325 MG tablet Take 2 tablets (650 mg total) by mouth every 4 (four) hours as needed for mild pain (or temp > 37.5 C (99.5 F)).     carbidopa-levodopa (SINEMET IR) 25-100 MG tablet Take 2 tablets by mouth 3 (three) times daily.     hydrOXYzine (ATARAX) 25 MG tablet Take 1 tablet (25 mg total) by mouth 2 (two) times daily as needed for anxiety or nausea. 30 tablet 0   ibuprofen (ADVIL) 400 MG tablet Take 1 tablet (400 mg total) by mouth every 8 (eight) hours as needed for headache or moderate pain. 20 tablet 0   ondansetron (ZOFRAN-ODT) 4 MG disintegrating tablet Take 1 tablet (4 mg total) by mouth every 8 (eight) hours as needed for nausea or vomiting. 20 tablet 0   OXYGEN Inhale 3 L/min into the lungs See admin instructions. 3 L/min at bedtime and as needed for shortness of breath throughout the day     potassium chloride SA (KLOR-CON M) 20 MEQ tablet Take 1 tablet (20 mEq total) by mouth daily. 30 tablet 3   topiramate (TOPAMAX) 50 MG tablet Take 1 tablet (50 mg total) by mouth at bedtime. 90 tablet 0   erythromycin (E-MYCIN) 250 MG tablet Take one tablet three times daily prior to meals for max four weeks 90 tablet 0   oxyCODONE (ROXICODONE) 5 MG immediate release tablet Take 1 tablet (5 mg total) by mouth every 8 (eight) hours as needed for moderate pain. 6 tablet 0   promethazine (PHENERGAN) 12.5 MG tablet Take 1 tablet (12.5 mg total) by mouth every 12 (twelve) hours as needed for nausea or vomiting. 20 tablet 0   No facility-administered medications prior to visit.    Allergies  Allergen Reactions   Meperidine Hives and Anaphylaxis  Strawberry Extract Hives and Swelling   Sucralfate Nausea Only and Other (See Comments)    Severe nausea Other reaction(s):  Other (See Comments) Severe nausea Severe nausea   Wasp Venom Hives        Objective:    Physical Exam Constitutional:      Appearance: Normal appearance. She is ill-appearing. She is not diaphoretic.  Cardiovascular:     Rate and Rhythm: Normal rate and regular rhythm.  Pulmonary:     Effort: Pulmonary effort is normal.     Breath sounds: Normal breath sounds.  Abdominal:     General: Abdomen is flat. Bowel sounds are increased.     Tenderness: There is abdominal tenderness (moderate tenderness all quadrants).  Neurological:     General: No focal deficit present.     Mental Status: She is alert and oriented to person, place, and time. Mental status is at baseline.  Psychiatric:        Mood and Affect: Mood normal.        Behavior: Behavior normal.        Thought Content: Thought content normal.        Judgment: Judgment normal.     BP 118/78   Pulse (!) 101   Temp 98.2 F (36.8 C)   Resp 16   Ht '6\' 1"'$  (1.854 m)   Wt 175 lb 2 oz (79.4 kg)   SpO2 100%   BMI 23.10 kg/m  Wt Readings from Last 3 Encounters:  10/17/22 175 lb 2 oz (79.4 kg)  10/11/22 183 lb (83 kg)  10/02/22 183 lb (83 kg)     There are no preventive care reminders to display for this patient.  There are no preventive care reminders to display for this patient.  Lab Results  Component Value Date   TSH 1.377 08/03/2022   Lab Results  Component Value Date   WBC 9.9 09/23/2022   HGB 10.8 (L) 09/23/2022   HCT 34.8 (L) 09/23/2022   MCV 82.3 09/23/2022   PLT 313 09/23/2022   Lab Results  Component Value Date   NA 135 10/17/2022   K 4.2 10/17/2022   CO2 21 10/17/2022   GLUCOSE 110 (H) 10/17/2022   BUN 19 10/17/2022   CREATININE 0.87 10/17/2022   BILITOT 0.5 10/17/2022   ALKPHOS 117 10/17/2022   AST 11 10/17/2022   ALT 6 10/17/2022   PROT 6.6 10/17/2022   ALBUMIN 3.7 10/17/2022   CALCIUM 9.2 10/17/2022   ANIONGAP 8 09/23/2022   GFR 68.02 10/17/2022   Lab Results  Component Value  Date   HGBA1C 6.2 04/27/2022      Assessment & Plan:   Problem List Items Addressed This Visit       Digestive   Nausea vomiting and diarrhea    Ordering cdiff and calprotectin R/o celiac and or IBD  Ordering h pylori as well, pending results.       Relevant Medications   promethazine (PHENERGAN) 12.5 MG tablet   Other Relevant Orders   C. difficile GDH and Toxin A/B (Completed)   CALPROTECTIN     Other   Low serum potassium - Primary    Repeat labs today pending results.       Relevant Orders   Comprehensive metabolic panel (Completed)   Epigastric pain   Relevant Orders   CALPROTECTIN   Leukocytes in urine   RESOLVED: Low blood potassium    Meds ordered this encounter  Medications   promethazine (PHENERGAN) 12.5 MG  tablet    Sig: Take 1 tablet (12.5 mg total) by mouth every 12 (twelve) hours as needed for nausea or vomiting.    Dispense:  20 tablet    Refill:  0    Order Specific Question:   Supervising Provider    Answer:   BEDSOLE, AMY E [[id number]]    Follow-up: No follow-ups on file.    Eugenia Pancoast, FNP

## 2022-10-18 LAB — COMPREHENSIVE METABOLIC PANEL
ALT: 6 U/L (ref 0–35)
AST: 11 U/L (ref 0–37)
Albumin: 3.7 g/dL (ref 3.5–5.2)
Alkaline Phosphatase: 117 U/L (ref 39–117)
BUN: 19 mg/dL (ref 6–23)
CO2: 21 mEq/L (ref 19–32)
Calcium: 9.2 mg/dL (ref 8.4–10.5)
Chloride: 102 mEq/L (ref 96–112)
Creatinine, Ser: 0.87 mg/dL (ref 0.40–1.20)
GFR: 68.02 mL/min (ref >60.00)
Glucose, Bld: 110 mg/dL — ABNORMAL HIGH (ref 70–99)
Potassium: 4.2 mEq/L (ref 3.5–5.1)
Sodium: 135 mEq/L (ref 135–145)
Total Bilirubin: 0.5 mg/dL (ref 0.2–1.2)
Total Protein: 6.6 g/dL (ref 6.0–8.3)

## 2022-10-18 IMAGING — CT CT HEAD W/O CM
3 series · 16 of 37 positions shown, 18 images · non-contrast
Comparison: CT head dated October 20, 2019. CT neck dated [place]

CLINICAL DATA: Fall.

EXAM:
CT HEAD WITHOUT CONTRAST
CT CERVICAL SPINE WITHOUT CONTRAST
TECHNIQUE: Multidetector CT imaging of the head and cervical spine was
performed following the standard protocol without intravenous
contrast. Multiplanar CT image reconstructions of the cervical spine
were also generated.

[Series 3: head without · axial · non-contrast · 0.42mm/px · z∈[+212,+348]mm · 7 of 37 slices shown, 9 images]
[im 5/37  brain]
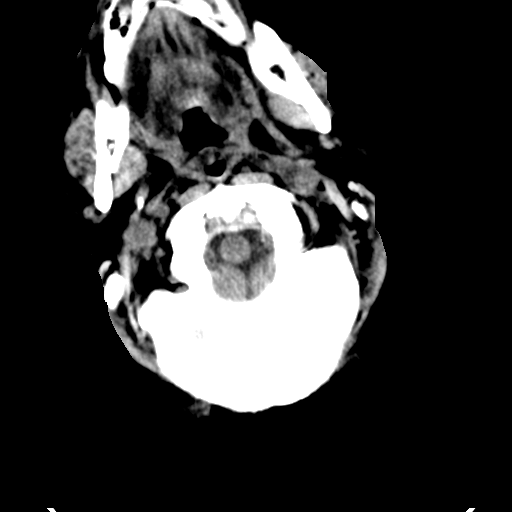
[im 5/37  bone]
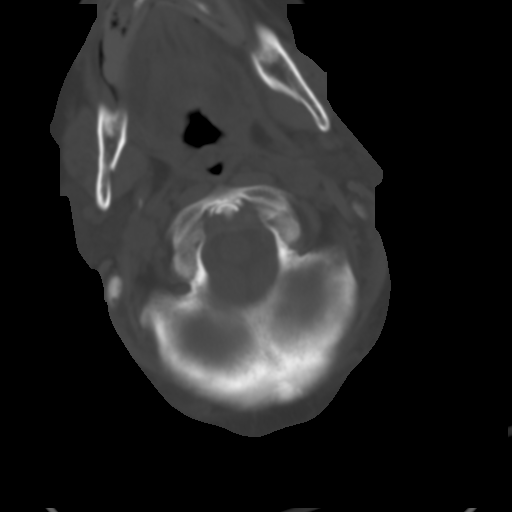
[im 10/37  brain]
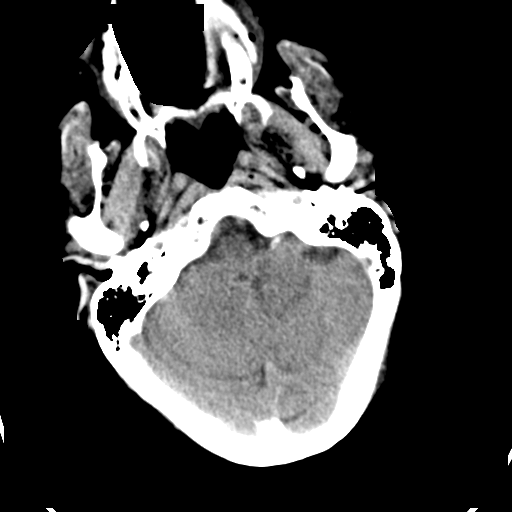
[im 14/37  brain]
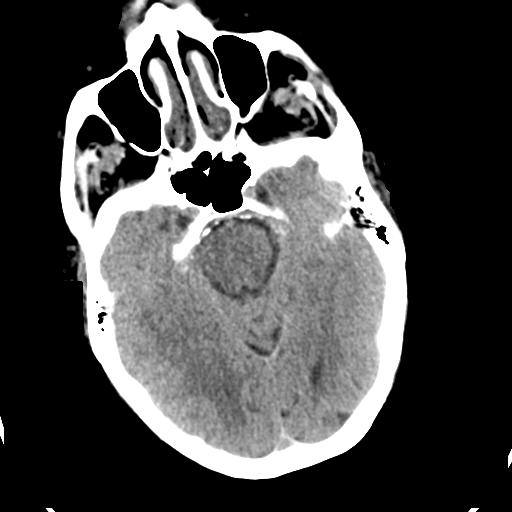
[im 19/37  brain]
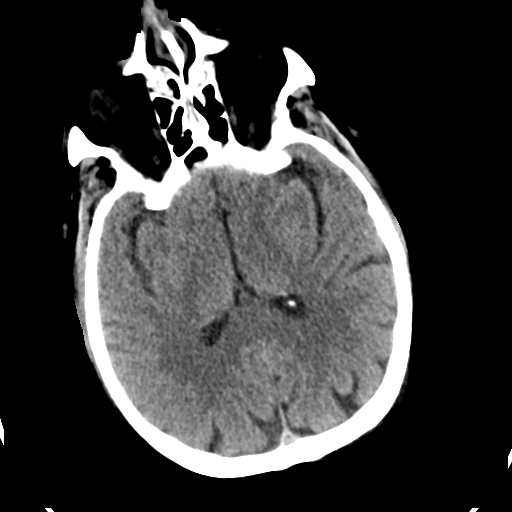
[im 23/37  brain]
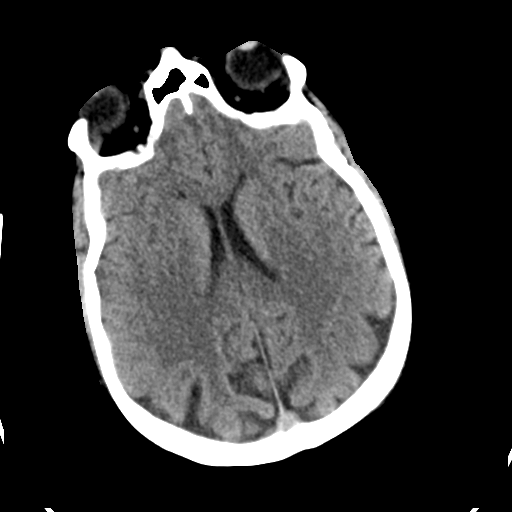
[im 23/37  bone]
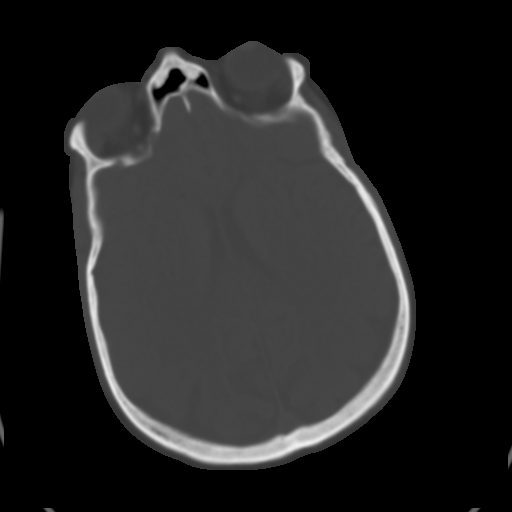
[im 28/37  brain]
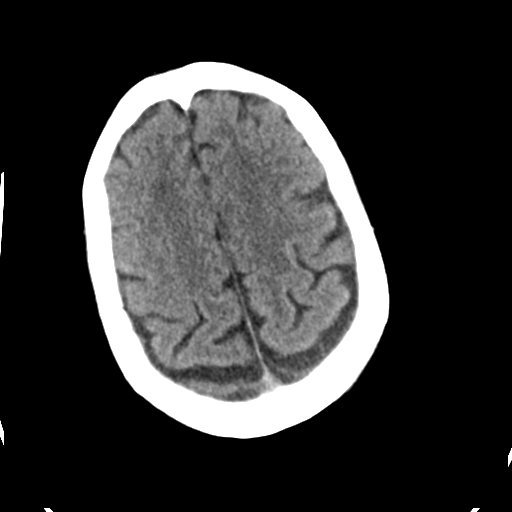
[im 32/37  brain]
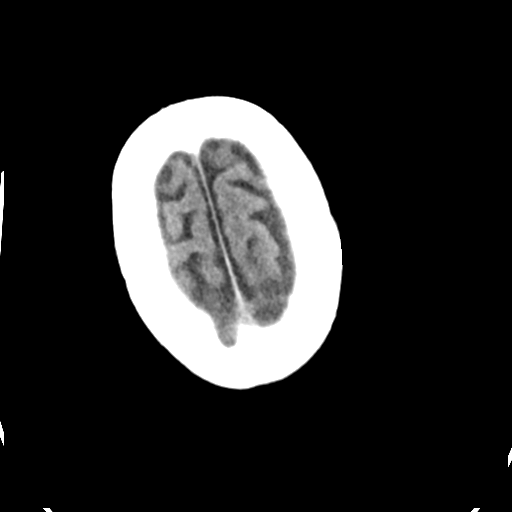

[Series 4: head bone · axial · 0.42mm/px · z∈[+210,+318]mm · 6 of 92 slices shown]
[im 10/92  bone]
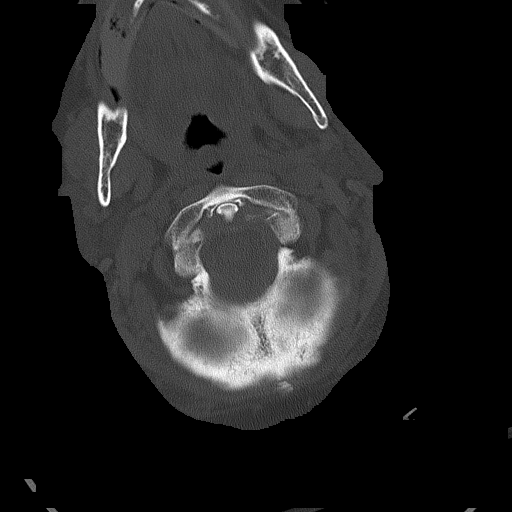
[im 19/92  bone]
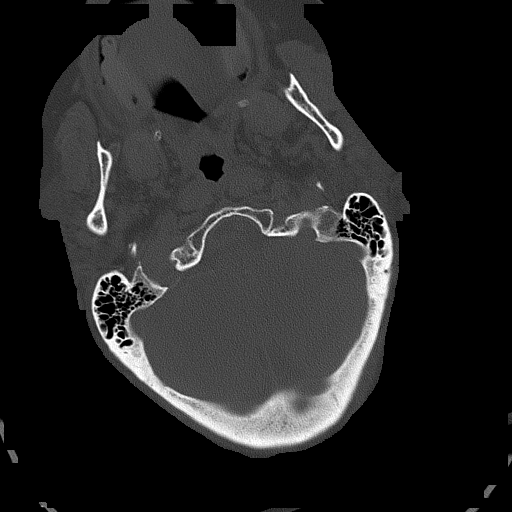
[im 28/92  bone]
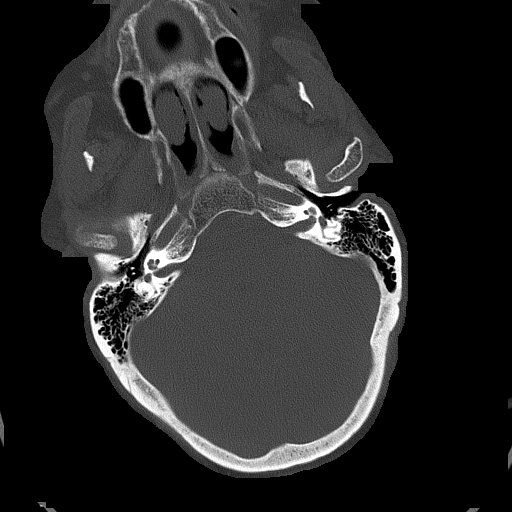
[im 41/92  bone]
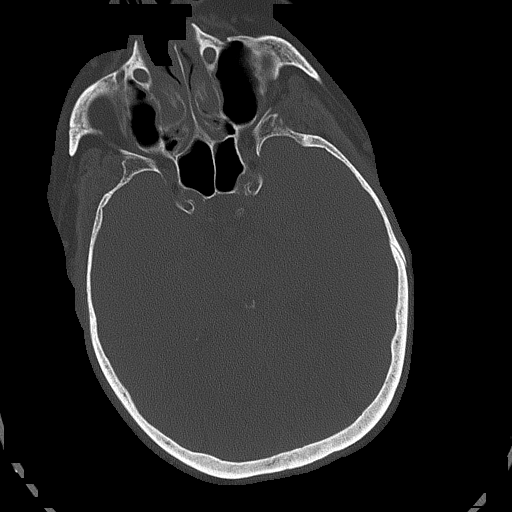
[im 51/92  bone]
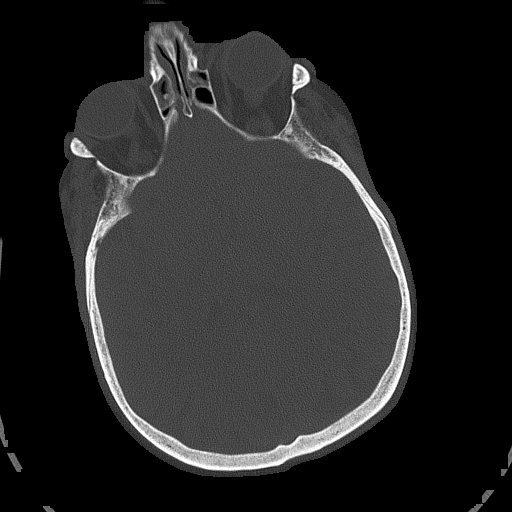
[im 64/92  bone]
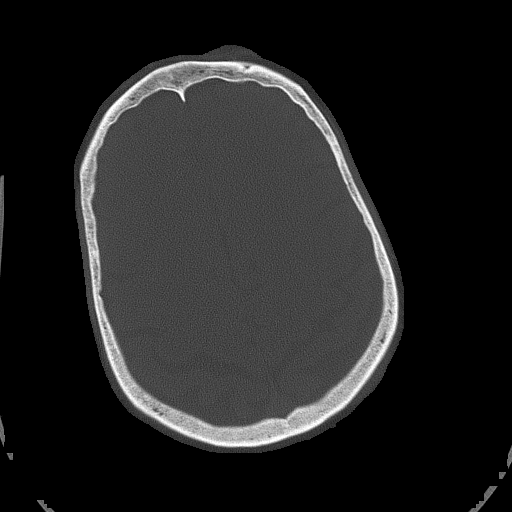

[Series 6: head without sag · sagittal · non-contrast · 0.30mm/px · 3 of 67 slices shown]
[im 23/67  brain]
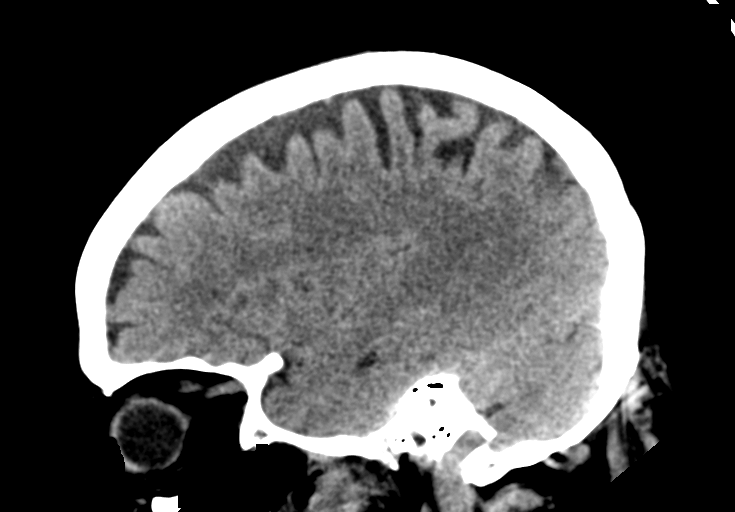
[im 34/67  brain]
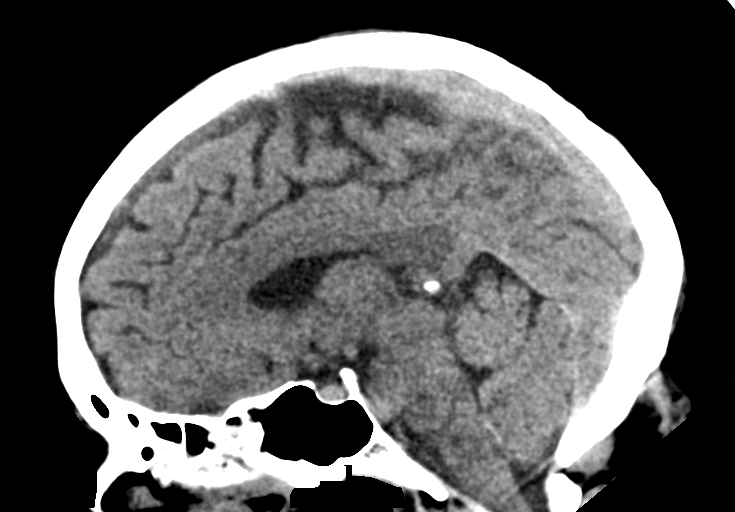
[im 45/67  brain]
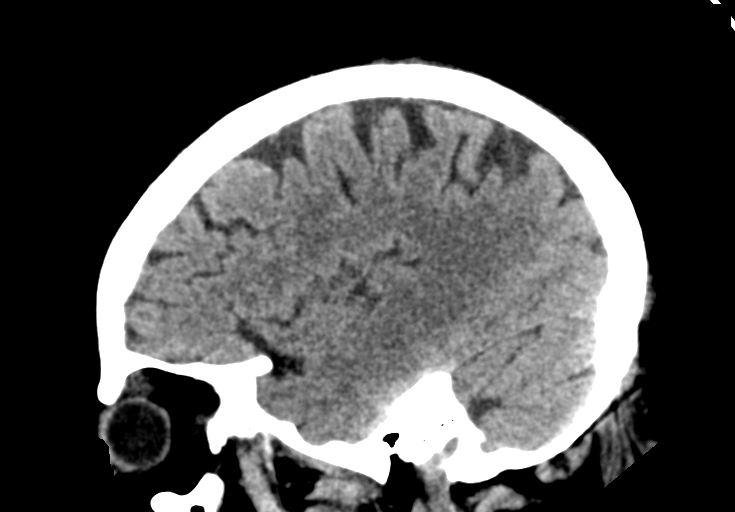

[16 of 37 positions shown; findings below may reference images not displayed]

FINDINGS: CT HEAD FINDINGS

Brain: No evidence of acute infarction, hemorrhage, hydrocephalus,
extra-axial collection or mass lesion/mass effect.

Vascular: Atherosclerotic vascular calcification of the carotid
siphons. No hyperdense vessel.

Skull: Normal. Negative for fracture or focal lesion.

Sinuses/Orbits: No acute finding. Bilateral ethmoid air cell mucosal
thickening.

Other: None.

CT CERVICAL SPINE FINDINGS

Alignment: No traumatic malalignment. Trace facet mediated
anterolisthesis at C3-C4.

Skull base and vertebrae: No acute fracture. Similar congenital C6
spina bifida occulta and dysplasia of the right C6 posterior
elements. No primary bone lesion or focal pathologic process.

Soft tissues and spinal canal: No prevertebral fluid or swelling. No
visible canal hematoma.

Disc levels: Mild disc height loss at C5-C6. Moderate facet
arthropathy on the right at C2-C3 and on the left at C3-C4 and
C6-C7. Unchanged moderate left neuroforaminal stenosis at C3-C4.

Upper chest: Biapical pleuroparenchymal scarring.

Other: None.
IMPRESSION: 1. No acute intracranial abnormality.
2. No acute cervical spine fracture or traumatic listhesis.
3. Similar congenital C6 spina bifida occulta and dysplasia of the
right C6 posterior elements.

## 2022-10-18 IMAGING — CT CT CERVICAL SPINE W/O CM
4 series · 15 of 33 positions shown, 18 images · non-contrast
Comparison: CT head dated October 20, 2019. CT neck dated [place]

CLINICAL DATA: Fall.

EXAM:
CT HEAD WITHOUT CONTRAST
CT CERVICAL SPINE WITHOUT CONTRAST
TECHNIQUE: Multidetector CT imaging of the head and cervical spine was
performed following the standard protocol without intravenous
contrast. Multiplanar CT image reconstructions of the cervical spine
were also generated.

[Series 3: c_spine 2.0 st · axial · 0.38mm/px · z∈[+91,+119]mm · 2 of 82 slices shown]
[im 14/82  bone]
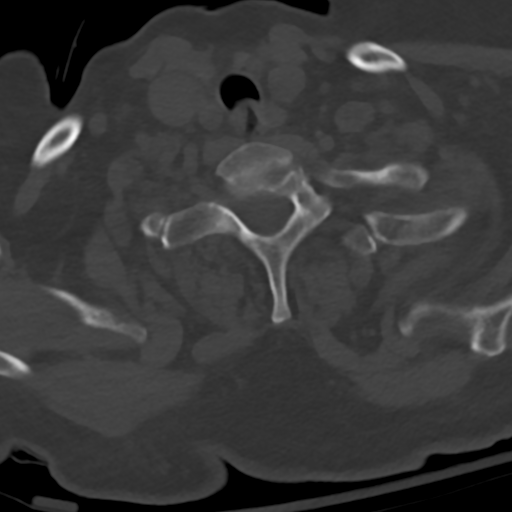
[im 28/82  bone]
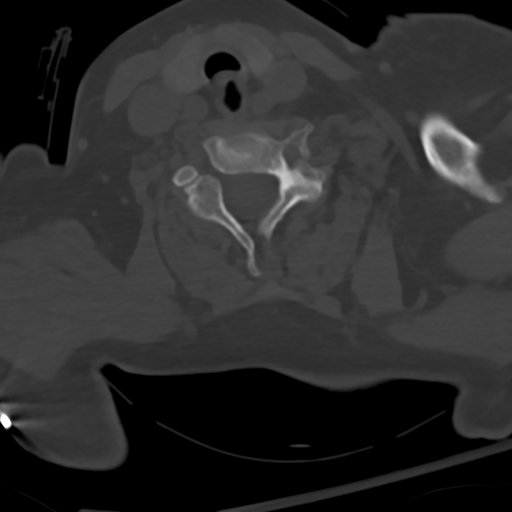

[Series 8: c_spine 2.0 sag bone · sagittal · 0.29mm/px · 5 of 81 slices shown, 6 images]
[im 27/81  bone]
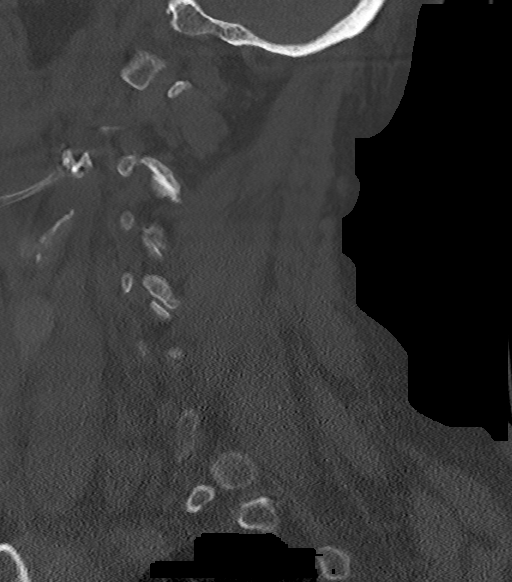
[im 34/81  bone]
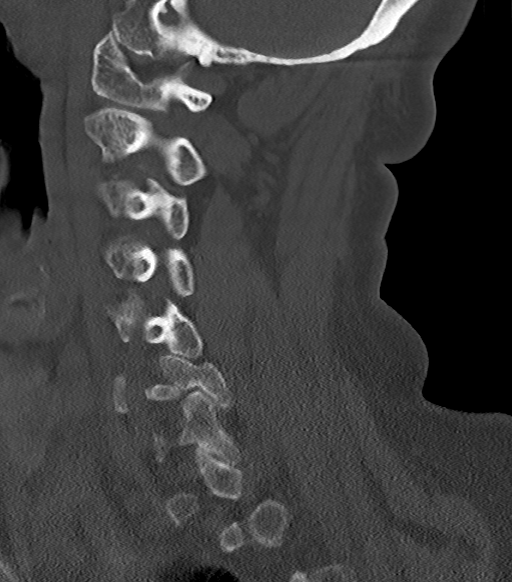
[im 41/81  soft-tissue]
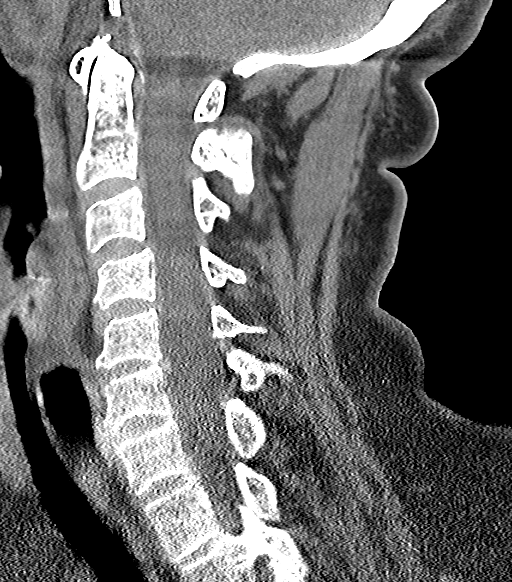
[im 41/81  bone]
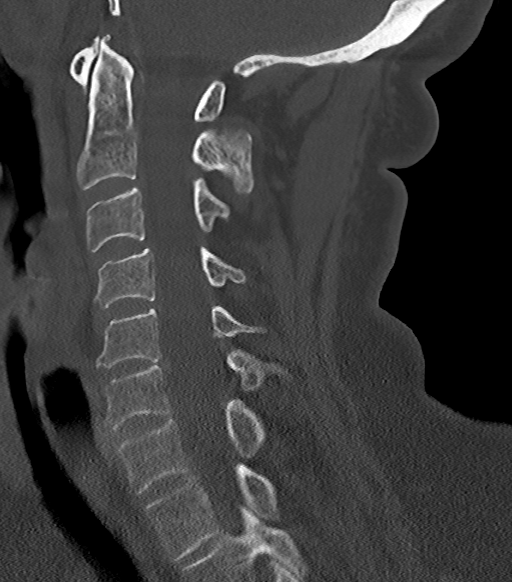
[im 47/81  bone]
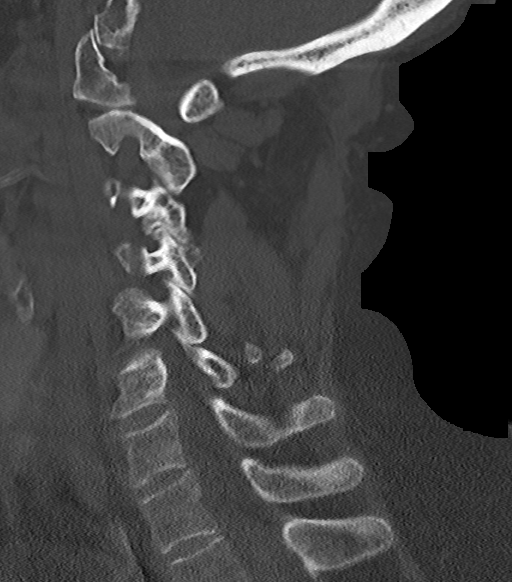
[im 54/81  bone]
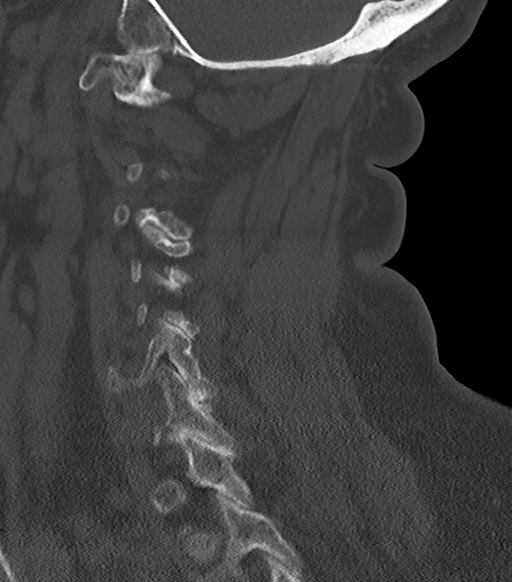

[Series 9: c_spine 2.0 orthogonals · axial · 0.21mm/px · z∈[+59,+188]mm · 5 of 92 slices shown, 7 images]
[im 16/92  soft-tissue]
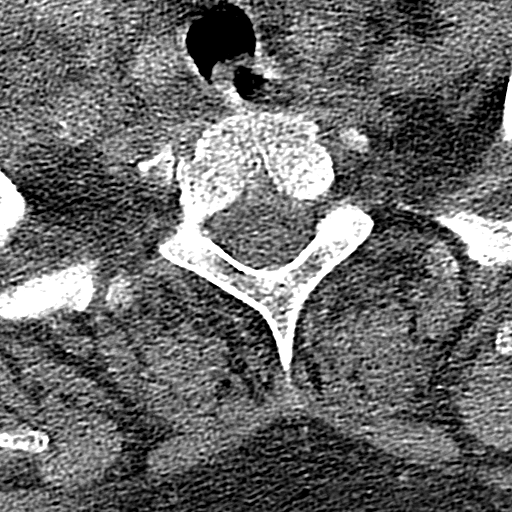
[im 16/92  bone]
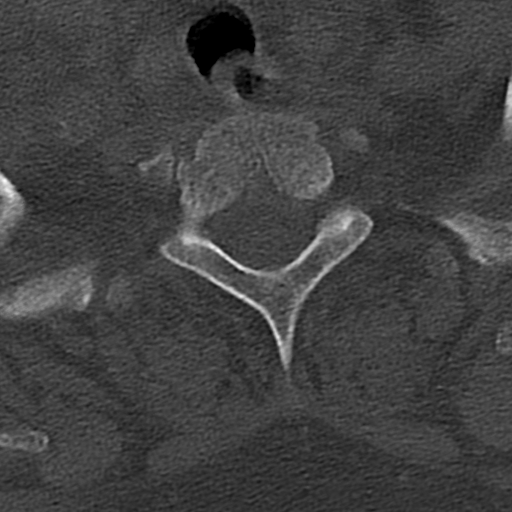
[im 31/92  bone]
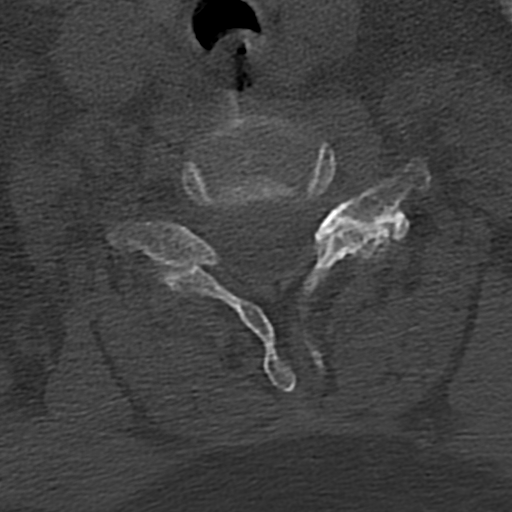
[im 46/92  bone]
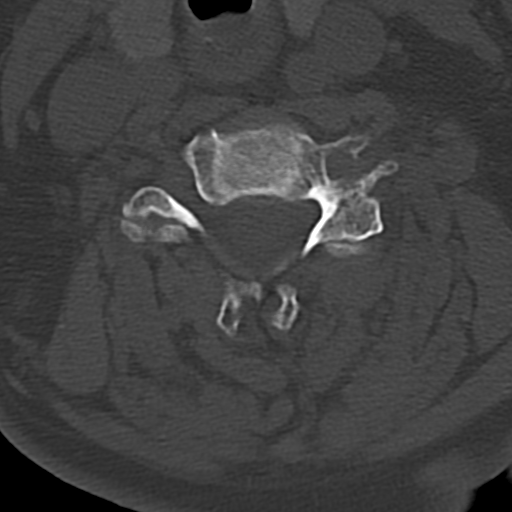
[im 61/92  bone]
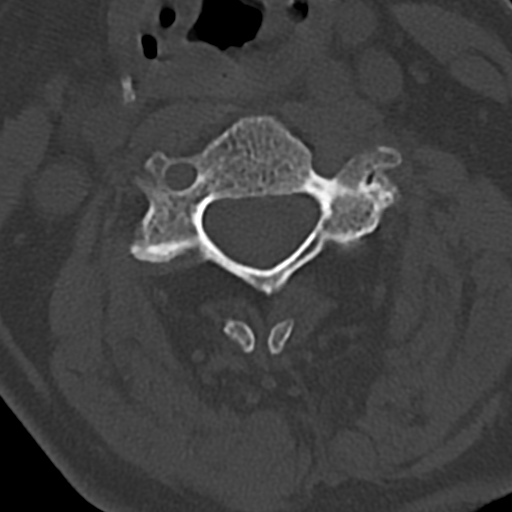
[im 76/92  soft-tissue]
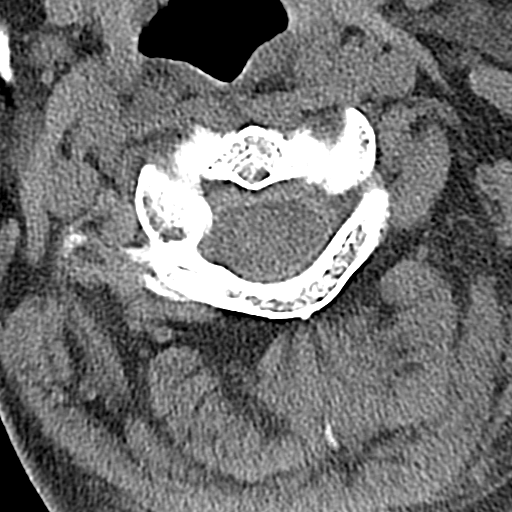
[im 76/92  bone]
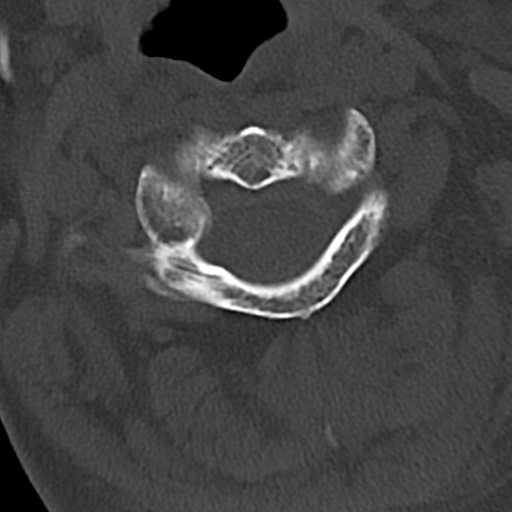

[Series 10: c_spine 2.0 cor bone · coronal · 0.32mm/px · 3 of 61 slices shown]
[im 13/61  bone]
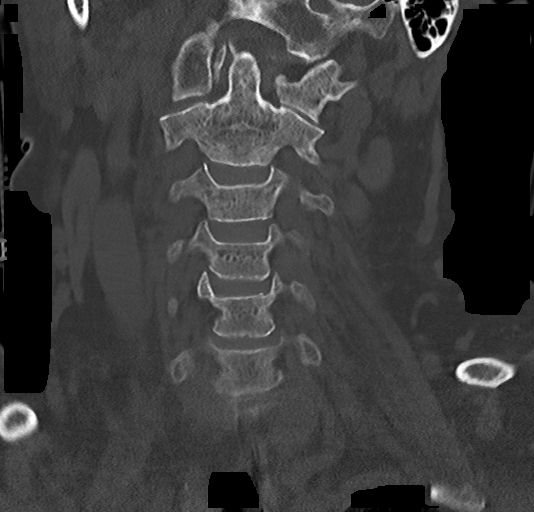
[im 25/61  bone]
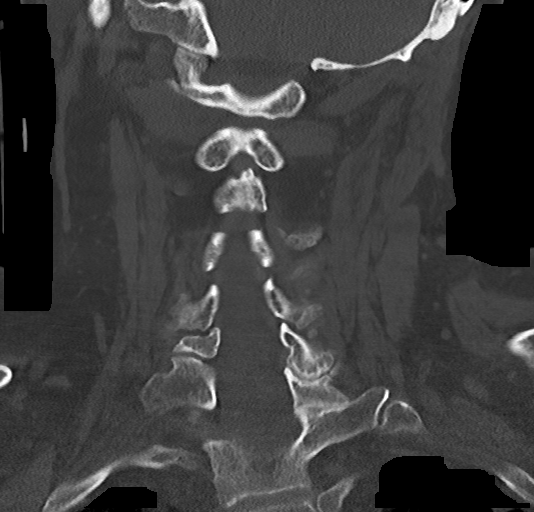
[im 37/61  bone]
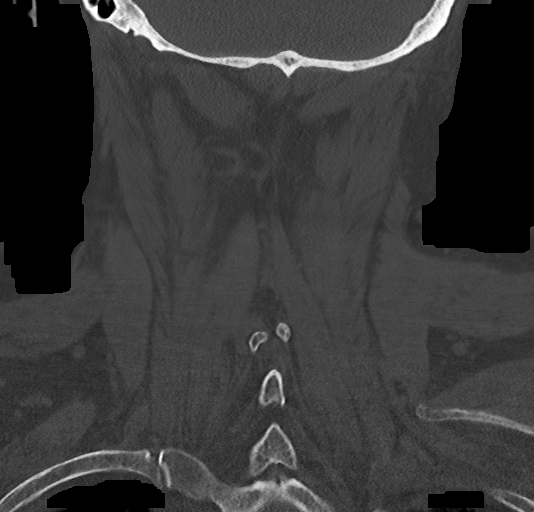

[15 of 33 positions shown; findings below may reference images not displayed]

FINDINGS: CT HEAD FINDINGS

Brain: No evidence of acute infarction, hemorrhage, hydrocephalus,
extra-axial collection or mass lesion/mass effect.

Vascular: Atherosclerotic vascular calcification of the carotid
siphons. No hyperdense vessel.

Skull: Normal. Negative for fracture or focal lesion.

Sinuses/Orbits: No acute finding. Bilateral ethmoid air cell mucosal
thickening.

Other: None.

CT CERVICAL SPINE FINDINGS

Alignment: No traumatic malalignment. Trace facet mediated
anterolisthesis at C3-C4.

Skull base and vertebrae: No acute fracture. Similar congenital C6
spina bifida occulta and dysplasia of the right C6 posterior
elements. No primary bone lesion or focal pathologic process.

Soft tissues and spinal canal: No prevertebral fluid or swelling. No
visible canal hematoma.

Disc levels: Mild disc height loss at C5-C6. Moderate facet
arthropathy on the right at C2-C3 and on the left at C3-C4 and
C6-C7. Unchanged moderate left neuroforaminal stenosis at C3-C4.

Upper chest: Biapical pleuroparenchymal scarring.

Other: None.
IMPRESSION: 1. No acute intracranial abnormality.
2. No acute cervical spine fracture or traumatic listhesis.
3. Similar congenital C6 spina bifida occulta and dysplasia of the
right C6 posterior elements.

## 2022-10-18 IMAGING — CR DG THORACIC SPINE 2V
2 series · 2 of 2 positions shown · non-contrast
Comparison: 06/26/2021

CLINICAL DATA: Fell, inability to move lower leg

EXAM:
THORACIC SPINE 2 VIEWS; LUMBAR SPINE - COMPLETE 4+ VIEW

[t-spine ap]
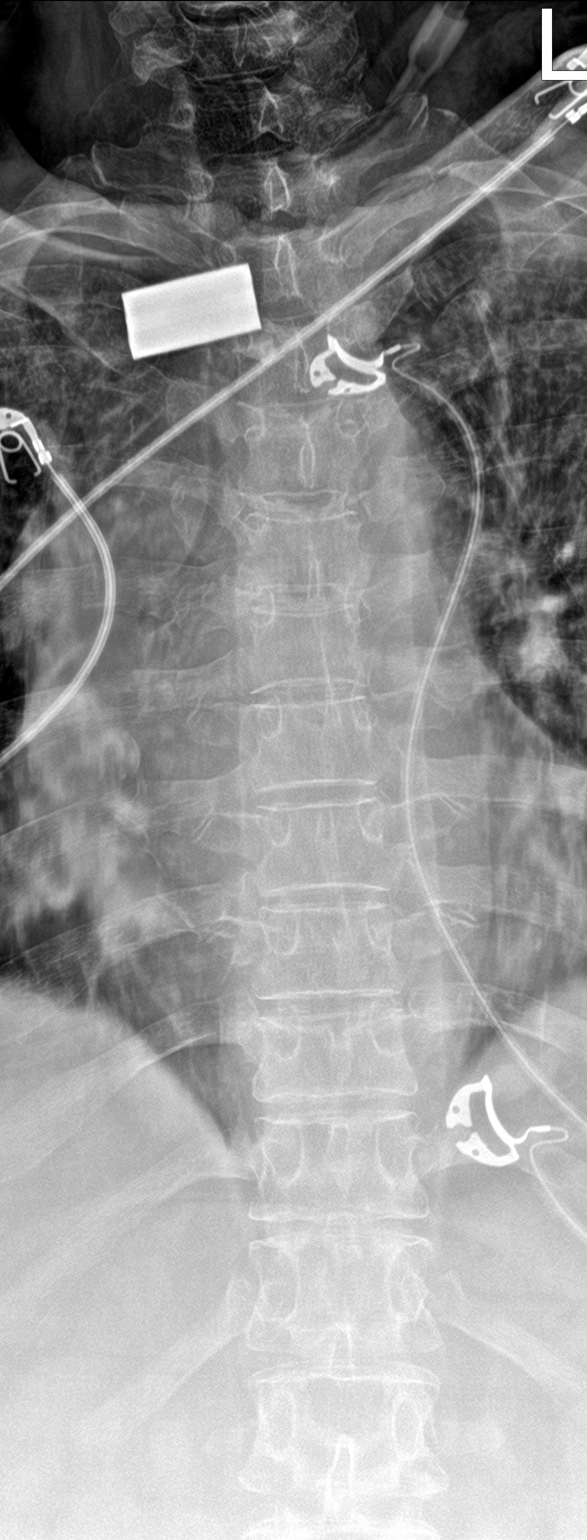

[t-spine lat]
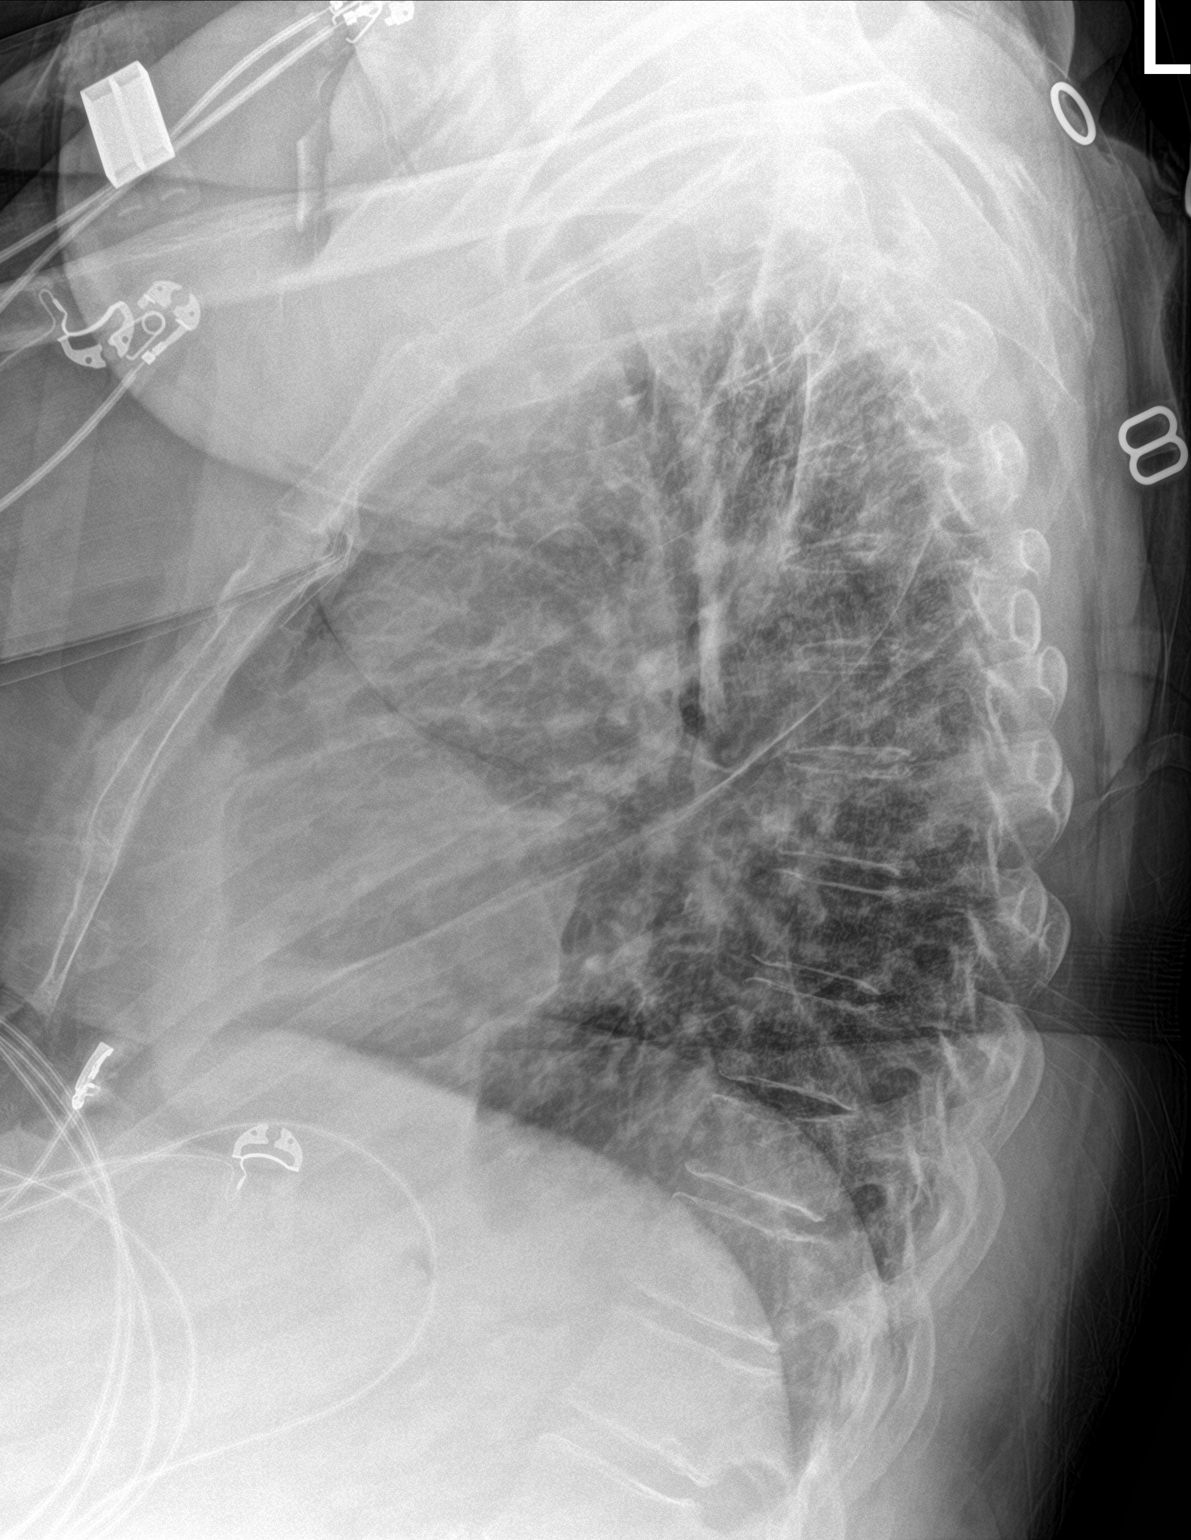

[2 of 2 positions shown; findings below may reference images not displayed]

FINDINGS: Thoracic spine: Frontal and lateral views of the thoracic spine
demonstrates stable appearance of the compression deformity
involving the superior endplate of the T10 vertebral body, with
approximately 25% loss of height. No retropulsion. No new fractures.
Disc spaces are well preserved. Paraspinal soft tissues appear
unremarkable.

Lumbar spine: Frontal, bilateral oblique, lateral views demonstrate
5 non-rib-bearing lumbar type vertebral bodies in grossly anatomic
alignment. There are no acute displaced fractures. Disc spaces are
well preserved. Mild facet hypertrophic changes are seen throughout
the lumbar spine greatest at the lumbosacral junction. Sacroiliac
joints are normal.
IMPRESSION: 1. Stable compression deformity through the superior endplate of the
T10 vertebral body, with less than 25% loss of height. No
retropulsion.
2. No acute lumbar spine fractures. Mild diffuse facet hypertrophic
changes as above.

## 2022-10-18 IMAGING — CR DG ANKLE 2V *L*
2 series · 2 of 2 positions shown · non-contrast
Comparison: None.

CLINICAL DATA: Fall with pain

EXAM:
LEFT ANKLE - 2 VIEW

[ankle ap]
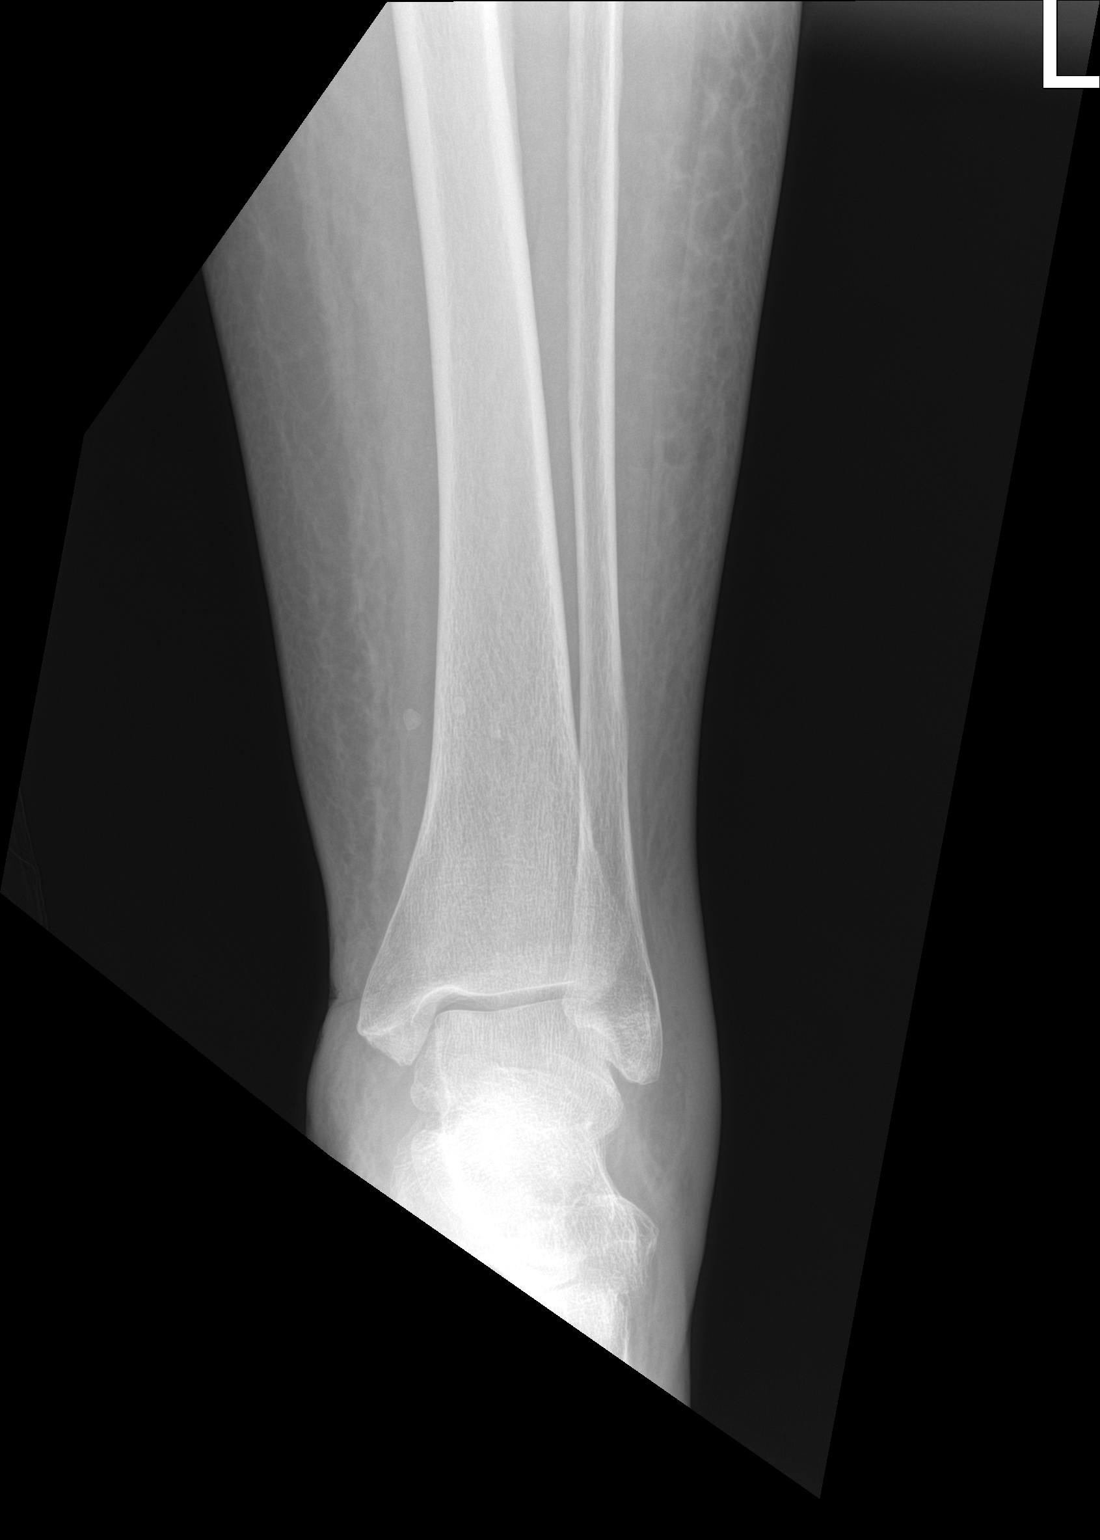

[ankle lat]
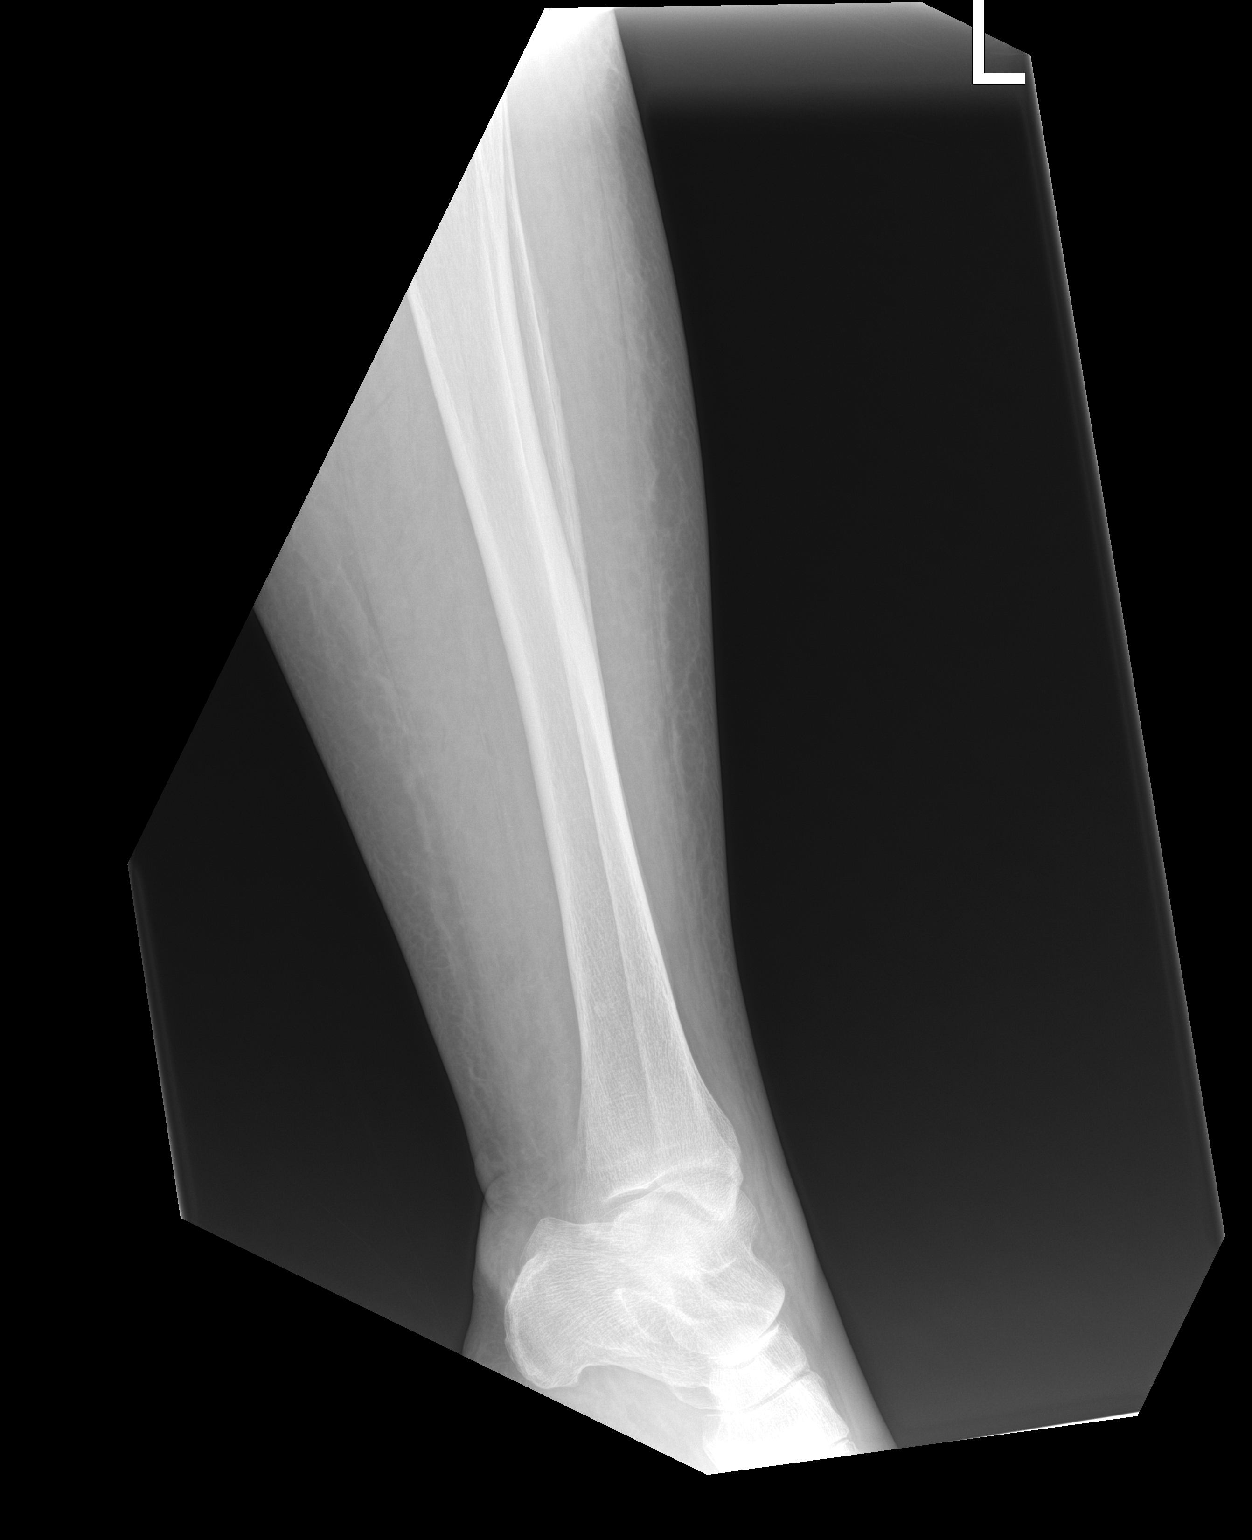

[2 of 2 positions shown; findings below may reference images not displayed]

FINDINGS: Diffuse soft tissue edema. Limited lateral view by positioning. No
gross fracture or malalignment
IMPRESSION: No definite acute osseous abnormality allowing for positioning

## 2022-10-18 IMAGING — CR DG LUMBAR SPINE COMPLETE 4+V
5 series · 5 of 5 positions shown · non-contrast
Comparison: 06/26/2021

CLINICAL DATA: Fell, inability to move lower leg

EXAM:
THORACIC SPINE 2 VIEWS; LUMBAR SPINE - COMPLETE 4+ VIEW

[l-spine ap]
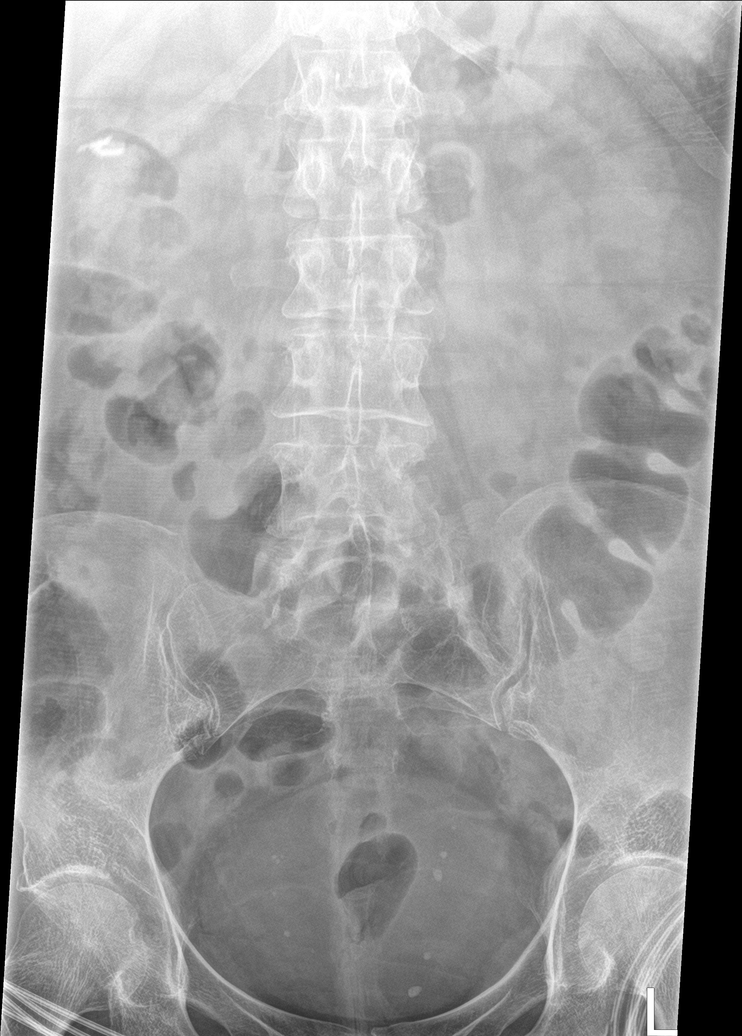

[l-spine obl (1 of 2)]
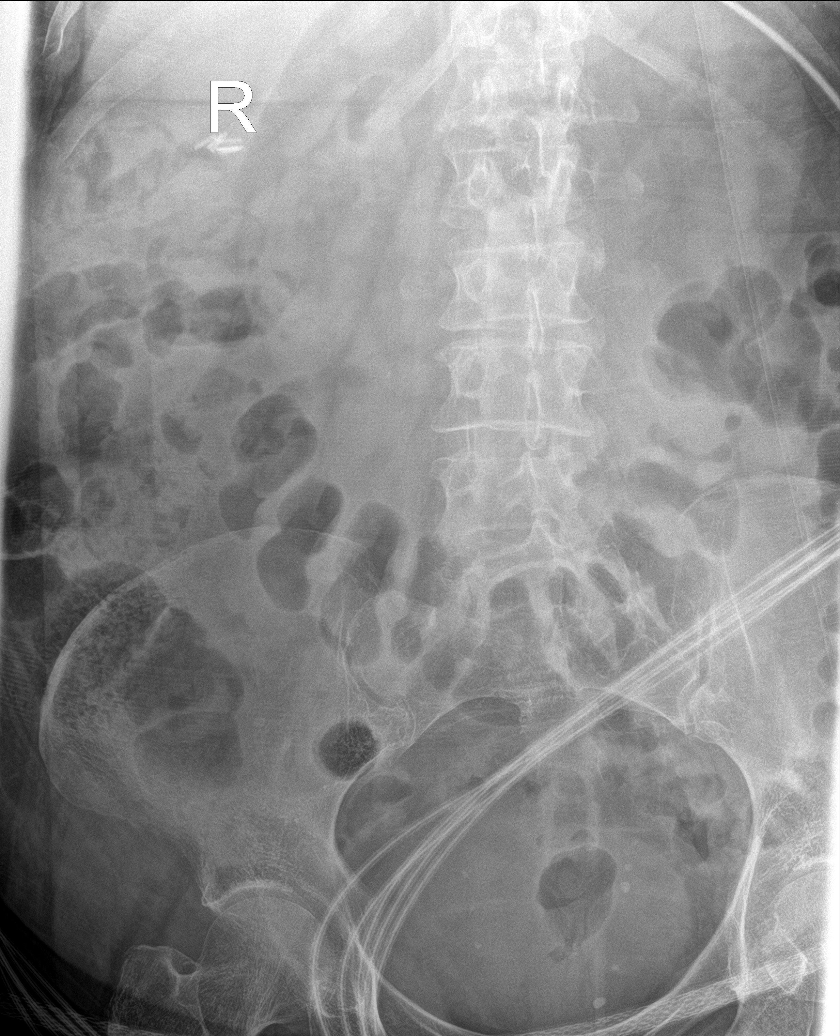

[l-spine obl (2 of 2)]
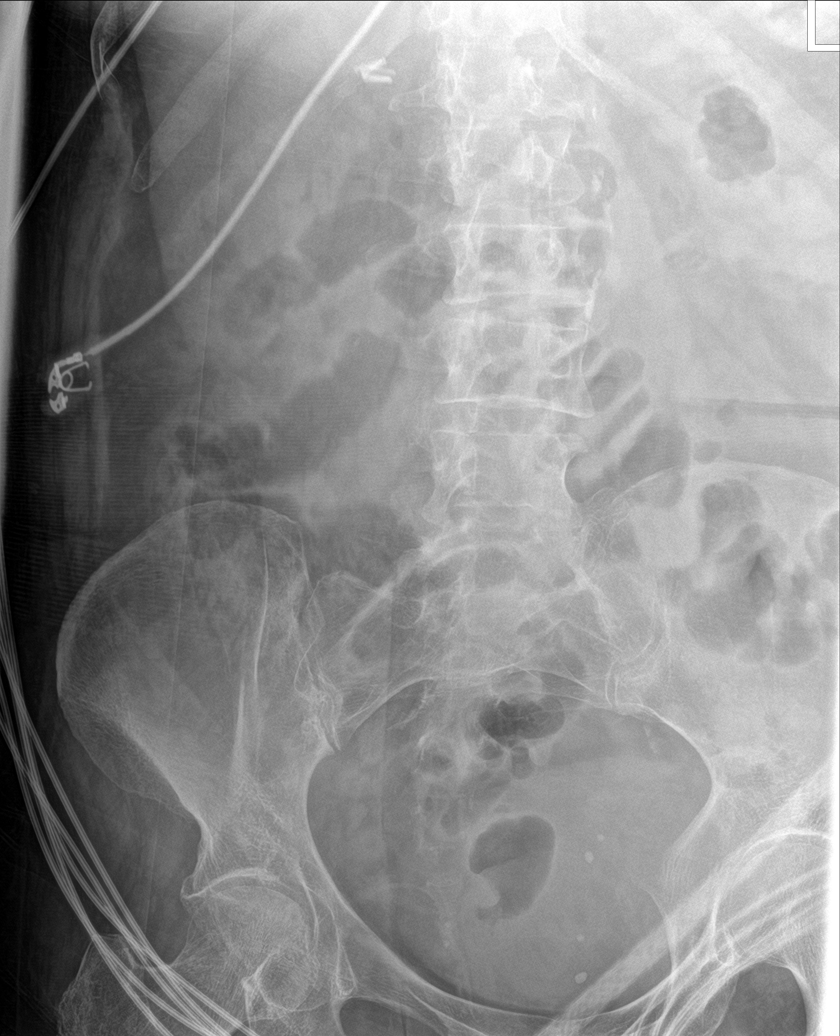

[l-spine spot]
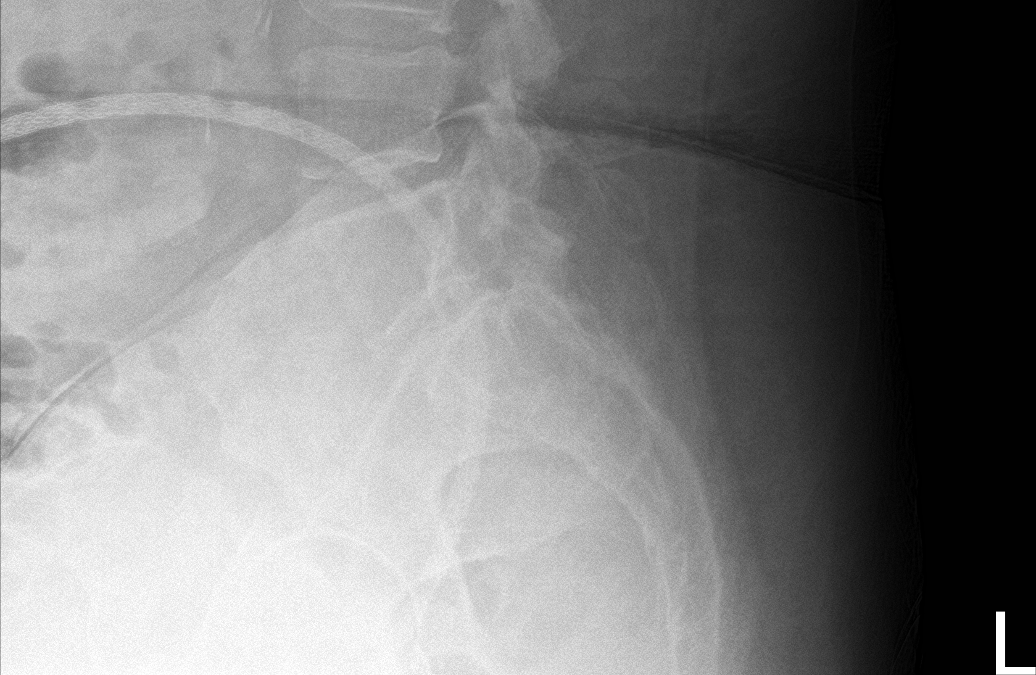

[l-spine lat]
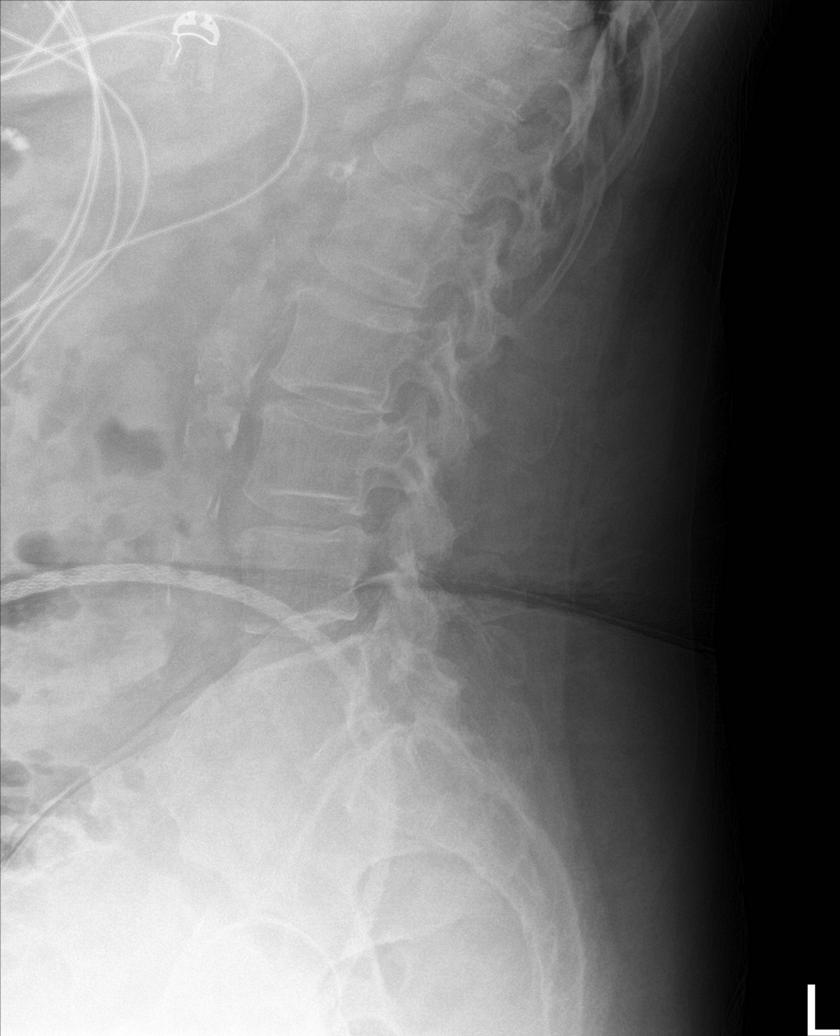

[5 of 5 positions shown; findings below may reference images not displayed]

FINDINGS: Thoracic spine: Frontal and lateral views of the thoracic spine
demonstrates stable appearance of the compression deformity
involving the superior endplate of the T10 vertebral body, with
approximately 25% loss of height. No retropulsion. No new fractures.
Disc spaces are well preserved. Paraspinal soft tissues appear
unremarkable.

Lumbar spine: Frontal, bilateral oblique, lateral views demonstrate
5 non-rib-bearing lumbar type vertebral bodies in grossly anatomic
alignment. There are no acute displaced fractures. Disc spaces are
well preserved. Mild facet hypertrophic changes are seen throughout
the lumbar spine greatest at the lumbosacral junction. Sacroiliac
joints are normal.
IMPRESSION: 1. Stable compression deformity through the superior endplate of the
T10 vertebral body, with less than 25% loss of height. No
retropulsion.
2. No acute lumbar spine fractures. Mild diffuse facet hypertrophic
changes as above.

## 2022-10-18 IMAGING — CR DG TIBIA/FIBULA 2V*L*
2 series · 2 of 2 positions shown · non-contrast
Comparison: None.

CLINICAL DATA: Fall with leg pain

EXAM:
LEFT TIBIA AND FIBULA - 2 VIEW

[tibia ap]
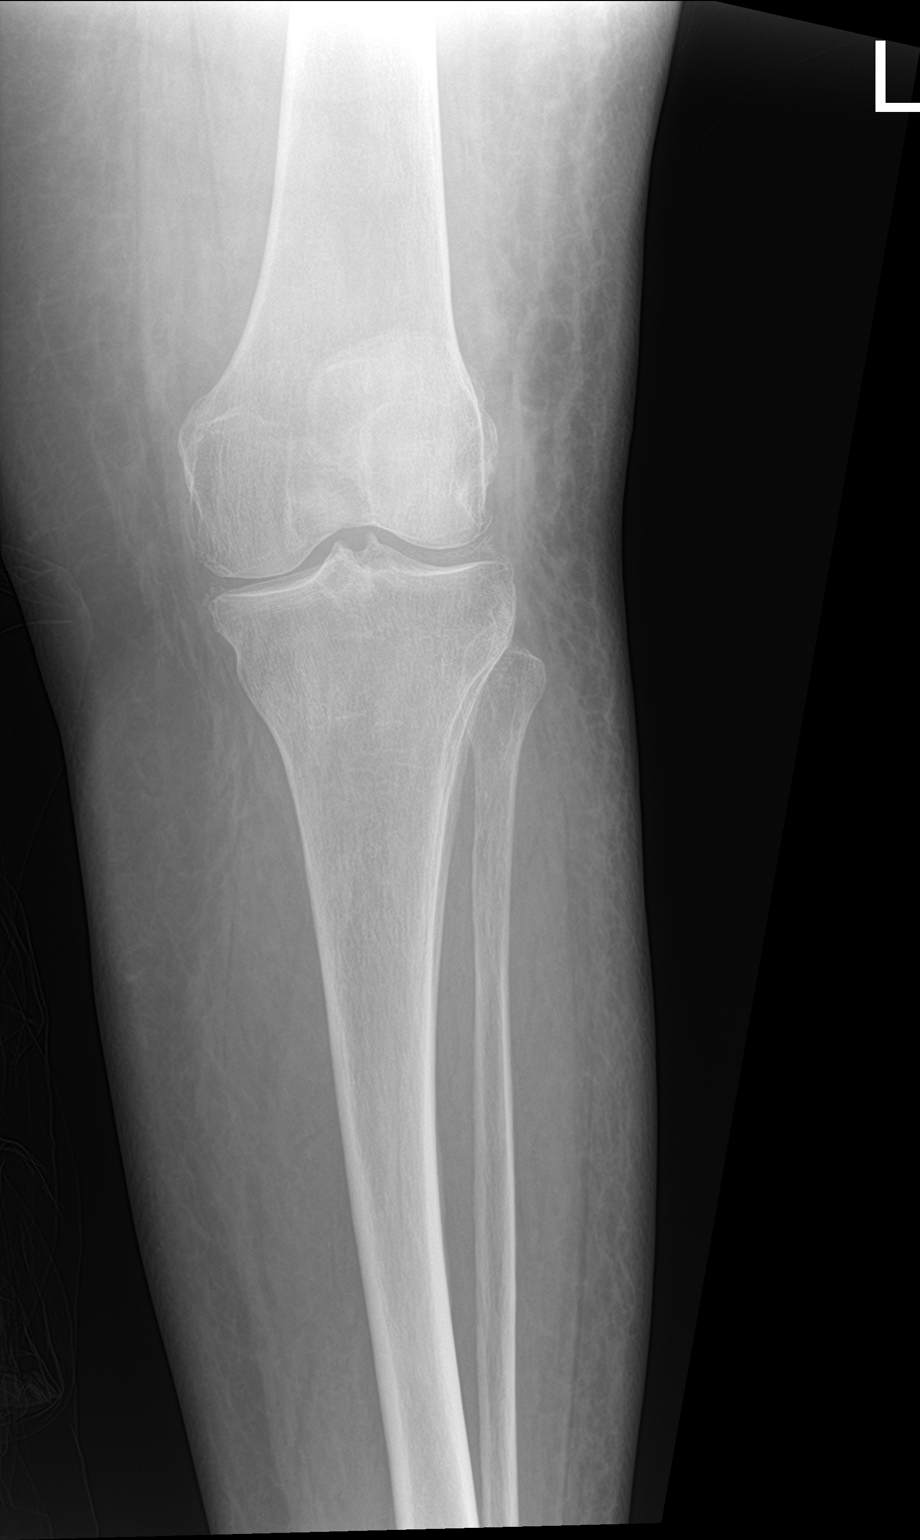

[tibia lat]
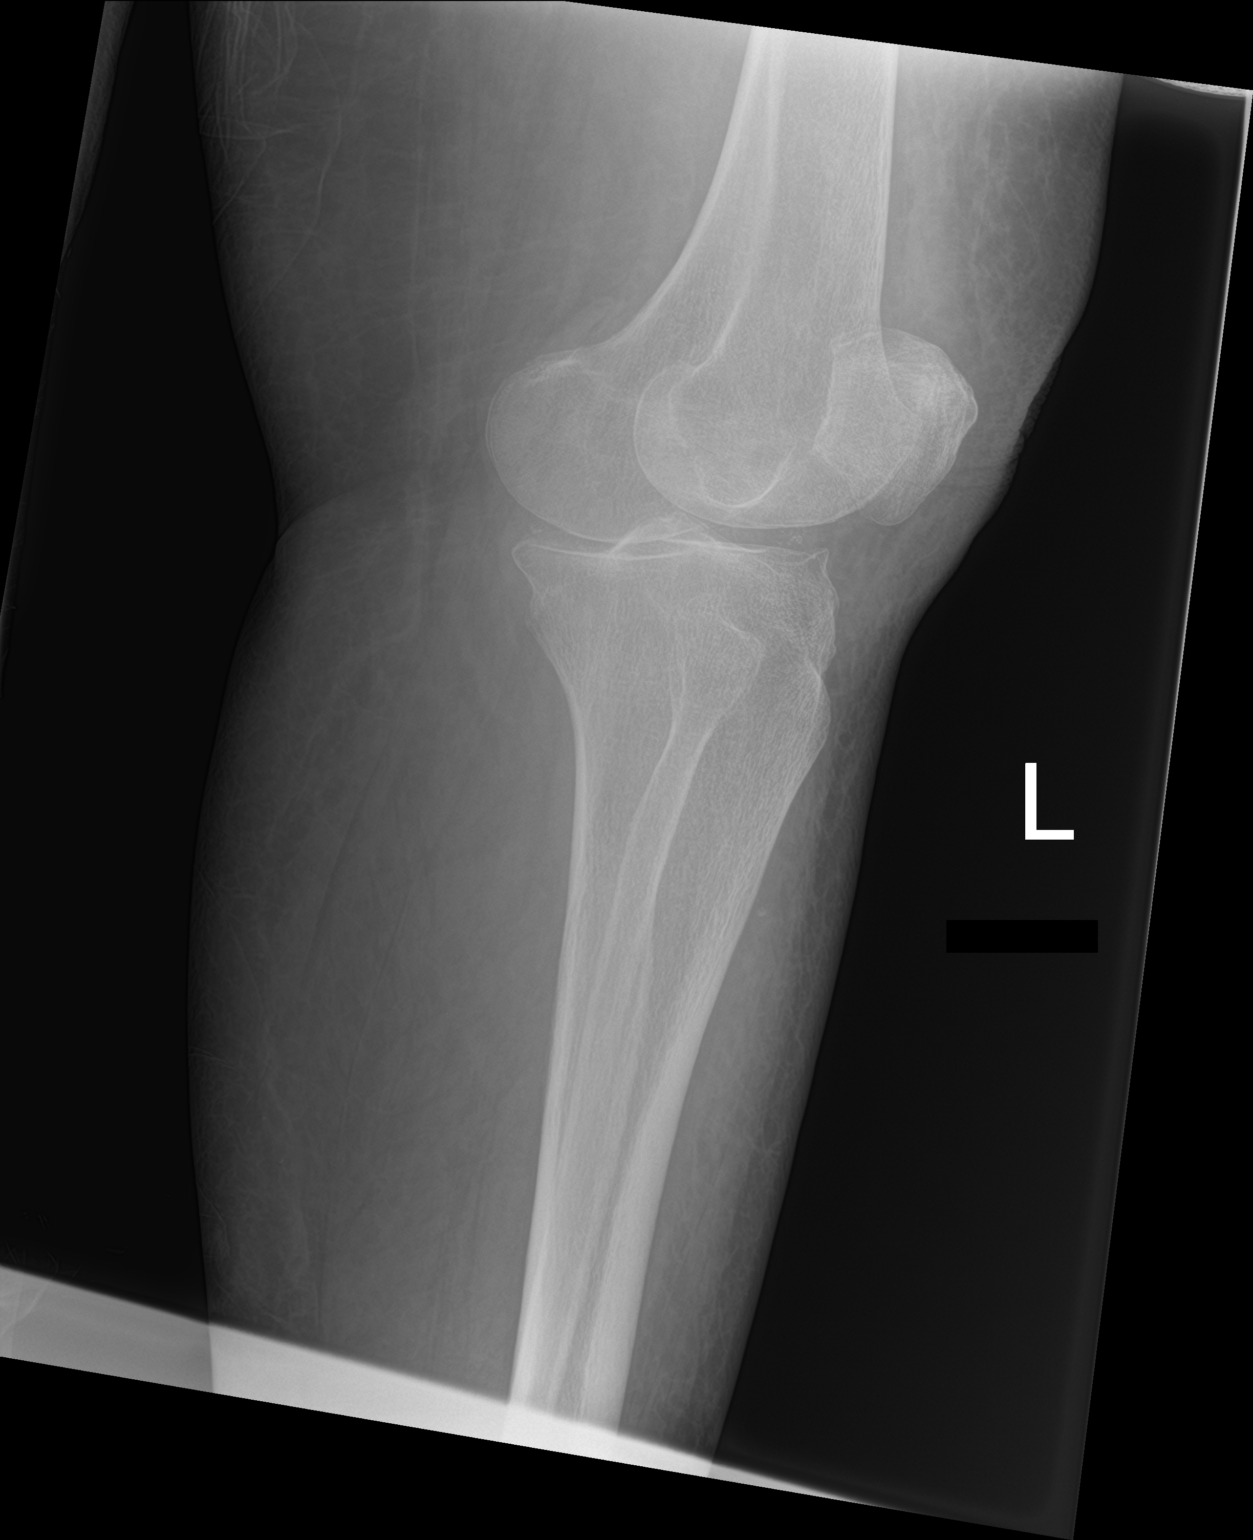

[2 of 2 positions shown; findings below may reference images not displayed]

FINDINGS: The distal tibia and fibular are included on the ankle radiographs.
No definitive fracture or malalignment. Generalized soft tissue
swelling. Joint space calcification consistent with
chondrocalcinosis.
IMPRESSION: No acute osseous abnormality.

## 2022-10-18 NOTE — Progress Notes (Signed)
Electrolytes much better since last time three weeks ago.

## 2022-10-19 ENCOUNTER — Other Ambulatory Visit: Payer: Self-pay | Admitting: Radiology

## 2022-10-19 DIAGNOSIS — R112 Nausea with vomiting, unspecified: Secondary | ICD-10-CM | POA: Diagnosis not present

## 2022-10-19 DIAGNOSIS — R1013 Epigastric pain: Secondary | ICD-10-CM | POA: Diagnosis not present

## 2022-10-19 DIAGNOSIS — R197 Diarrhea, unspecified: Secondary | ICD-10-CM | POA: Diagnosis not present

## 2022-10-19 NOTE — Progress Notes (Signed)
Urine negative for infection.

## 2022-10-22 LAB — H. PYLORI BREATH TEST: H. pylori Breath Test: DETECTED — AB

## 2022-10-22 LAB — C. DIFFICILE GDH AND TOXIN A/B
GDH ANTIGEN: NOT DETECTED
MICRO NUMBER:: 14141655
SPECIMEN QUALITY:: ADEQUATE
TOXIN A AND B: NOT DETECTED

## 2022-10-23 ENCOUNTER — Other Ambulatory Visit: Payer: Self-pay | Admitting: Family

## 2022-10-23 DIAGNOSIS — A048 Other specified bacterial intestinal infections: Secondary | ICD-10-CM

## 2022-10-23 DIAGNOSIS — R82998 Other abnormal findings in urine: Secondary | ICD-10-CM | POA: Insufficient documentation

## 2022-10-23 DIAGNOSIS — R1013 Epigastric pain: Secondary | ICD-10-CM | POA: Insufficient documentation

## 2022-10-23 MED ORDER — PANTOPRAZOLE SODIUM 40 MG PO TBEC: 40.0000 mg | DELAYED_RELEASE_TABLET | Freq: Two times a day (BID) | ORAL | 0 refills | Status: DC

## 2022-10-23 MED ORDER — CLARITHROMYCIN 500 MG PO TABS: 500.0000 mg | ORAL_TABLET | Freq: Two times a day (BID) | ORAL | 0 refills | Status: AC

## 2022-10-23 MED ORDER — METRONIDAZOLE 500 MG PO TABS: 500.0000 mg | ORAL_TABLET | Freq: Two times a day (BID) | ORAL | 0 refills | Status: AC

## 2022-10-23 MED ORDER — AMOXICILLIN 500 MG PO CAPS: 1000.0000 mg | ORAL_CAPSULE | Freq: Two times a day (BID) | ORAL | 0 refills | Status: AC

## 2022-10-23 NOTE — Assessment & Plan Note (Signed)
Repeat labs today pending results.

## 2022-10-23 NOTE — Progress Notes (Signed)
Rafia -I ended up finding something on your most recent lab work..  You were positive for H. pylori which can cause increased nausea vomiting burping and/or acid reflux.  It can contribute to your stomach pain as well.  The treatment for this includes 4 different medications and it may seem like a lot but at this point it may result in improvement of your symptoms.  At this time I do suggest that you stop taking the erythromycin that I had to start taking last time.  This is because you are going to start on 3 different antibiotics as well as a pantoprazole which is a PPI to help reduce the acidity.  I do also suggest that while you are taking this medication that you do not take the oxycodone if you can help it because this medication will increase the toxicity risk of oxycodone.  If you have any questions please let me know. Each medication is for duration of 14 days.  Take in its entirety

## 2022-10-23 NOTE — Assessment & Plan Note (Signed)
Ordering cdiff and calprotectin R/o celiac and or IBD  Ordering h pylori as well, pending results.

## 2022-10-23 NOTE — Progress Notes (Signed)
Negative stool for cdiff.

## 2022-10-24 LAB — CALPROTECTIN: Calprotectin: 12 mcg/g

## 2022-10-25 ENCOUNTER — Other Ambulatory Visit: Payer: Self-pay | Admitting: Gastroenterology

## 2022-10-25 ENCOUNTER — Other Ambulatory Visit: Payer: Self-pay

## 2022-10-25 DIAGNOSIS — K529 Noninfective gastroenteritis and colitis, unspecified: Secondary | ICD-10-CM | POA: Diagnosis not present

## 2022-10-25 DIAGNOSIS — R1030 Lower abdominal pain, unspecified: Secondary | ICD-10-CM

## 2022-10-25 DIAGNOSIS — R197 Diarrhea, unspecified: Secondary | ICD-10-CM | POA: Diagnosis not present

## 2022-10-25 DIAGNOSIS — Z9049 Acquired absence of other specified parts of digestive tract: Secondary | ICD-10-CM | POA: Diagnosis not present

## 2022-10-25 DIAGNOSIS — F411 Generalized anxiety disorder: Secondary | ICD-10-CM

## 2022-10-25 DIAGNOSIS — R112 Nausea with vomiting, unspecified: Secondary | ICD-10-CM

## 2022-10-25 DIAGNOSIS — F331 Major depressive disorder, recurrent, moderate: Secondary | ICD-10-CM

## 2022-10-25 DIAGNOSIS — Z0389 Encounter for observation for other suspected diseases and conditions ruled out: Secondary | ICD-10-CM | POA: Diagnosis not present

## 2022-10-25 LAB — URINE CULTURE
MICRO NUMBER:: 14130313
SPECIMEN QUALITY:: ADEQUATE

## 2022-10-25 LAB — CELIAC PNL 2 RFLX ENDOMYSIAL AB TTR
(tTG) Ab, IgA: <1 U/mL
(tTG) Ab, IgG: 1.5 U/mL
Endomysial Ab IgA: NEGATIVE
Gliadin IgA: <1 U/mL
Gliadin IgG: <1 U/mL
Immunoglobulin A: 151 mg/dL (ref 70–320)

## 2022-10-25 LAB — ALPHA-GAL PANEL
Allergen, Mutton, f88: <0.1 kU/L
Allergen, Pork, f26: <0.1 kU/L
Beef: <0.1 kU/L
CLASS: 0
CLASS: 0
Class: 0
GALACTOSE-ALPHA-1,3-GALACTOSE IGE*: <0.1 kU/L (ref <0.10)

## 2022-10-25 LAB — INTERPRETATION:

## 2022-10-25 NOTE — Progress Notes (Signed)
Negative calportectin.  Please ask pt did she pick up all meds and has she started them?

## 2022-10-26 MED ORDER — HYDROXYZINE HCL 25 MG PO TABS: 25.0000 mg | ORAL_TABLET | Freq: Two times a day (BID) | ORAL | 0 refills | Status: DC | PRN

## 2022-10-26 NOTE — Progress Notes (Signed)
Negative for celiacs

## 2022-10-27 DIAGNOSIS — I509 Heart failure, unspecified: Secondary | ICD-10-CM | POA: Diagnosis not present

## 2022-10-27 DIAGNOSIS — J449 Chronic obstructive pulmonary disease, unspecified: Secondary | ICD-10-CM | POA: Diagnosis not present

## 2022-10-29 DIAGNOSIS — R1030 Lower abdominal pain, unspecified: Secondary | ICD-10-CM | POA: Diagnosis not present

## 2022-10-29 DIAGNOSIS — K529 Noninfective gastroenteritis and colitis, unspecified: Secondary | ICD-10-CM | POA: Diagnosis not present

## 2022-10-29 IMAGING — MR MR THORACIC SPINE W/O CM
5 of 6 series · 31 of 48 positions shown · non-contrast
Comparison: Radiographs of the thoracic spine 07/01/2021.
Radiographs of the thoracic spine 06/26/2021.

CLINICAL DATA: Closed wedge compression fracture of T10 vertebra,
initial encounter (HCC) 1AA.3539 (IWM-DH-CM). Additional history
provided by scanning technologist: Patient reports injured in fall 1
month ago, fall again 1 week ago on a concrete pool, mid/low back
pain since fall.

EXAM:
MRI THORACIC SPINE WITHOUT CONTRAST
TECHNIQUE: Multiplanar, multisequence MR imaging of the thoracic spine was
performed. No intravenous contrast was administered.

[Series 18: T1 · sagittal · 6.0mm · 1.41mm/px · 5 of 12 slices shown (1 of 2)]
[im 1/12]
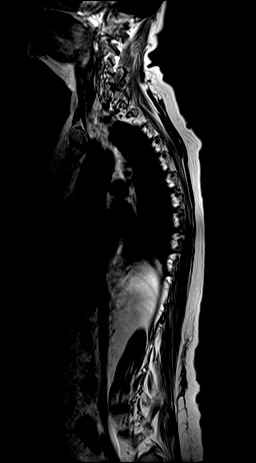
[im 3/12]
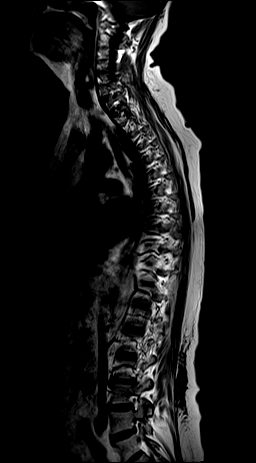
[im 6/12]
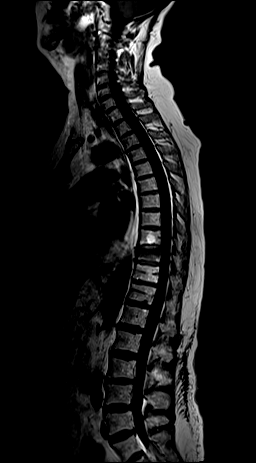
[im 9/12]
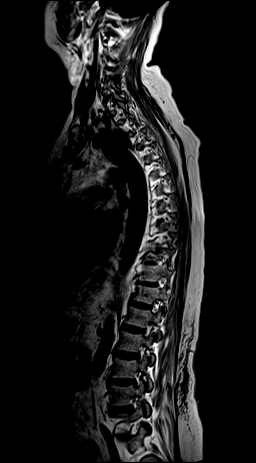
[im 12/12]
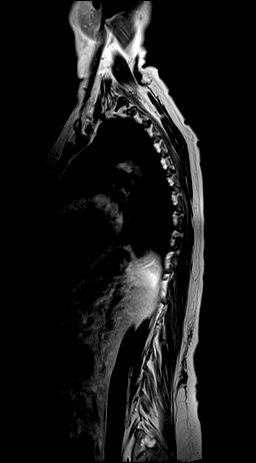

[Series 19: T2 · sagittal · 3.0mm · 1.06mm/px · 7 of 20 slices shown (1 of 2)]
[im 1/20]
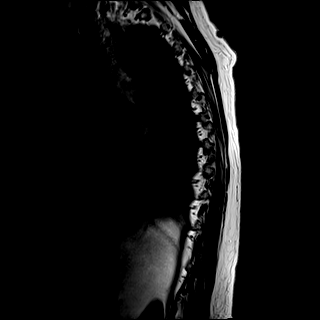
[im 4/20]
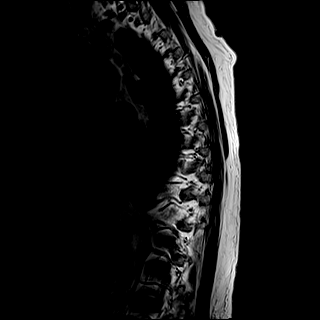
[im 7/20]
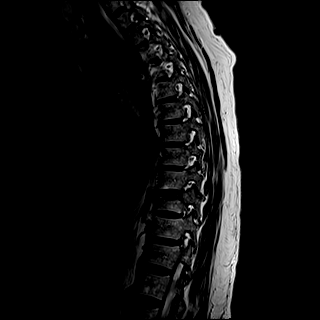
[im 10/20]
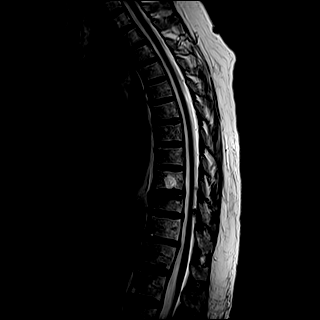
[im 13/20]
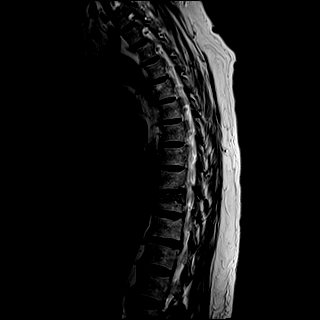
[im 16/20]
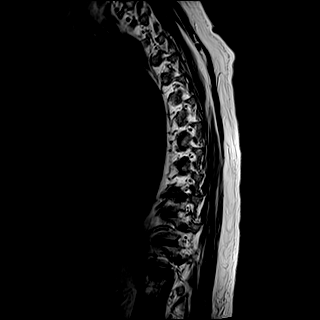
[im 20/20]
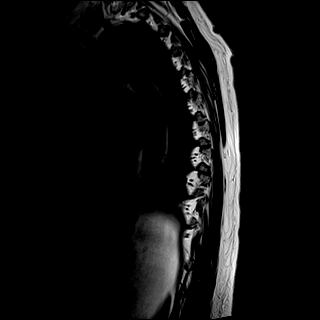

[Series 20: T1 · sagittal · 3.0mm · 1.06mm/px · 6 of 20 slices shown (2 of 2)]
[im 1/20]
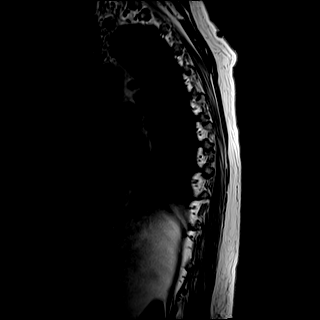
[im 4/20]
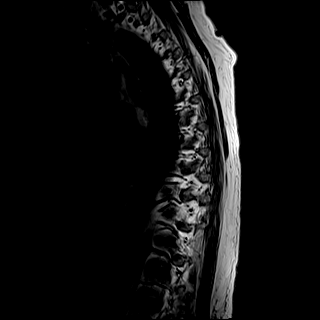
[im 8/20]
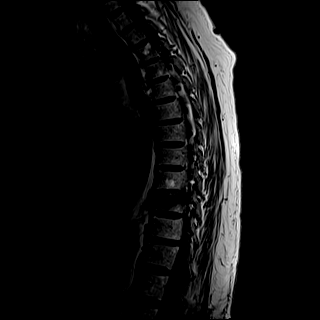
[im 12/20]
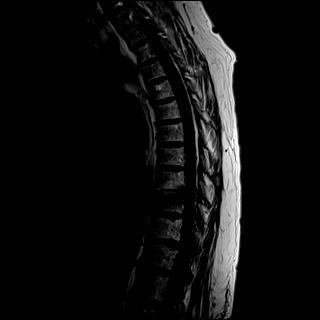
[im 16/20]
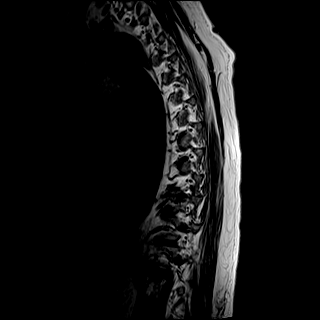
[im 20/20]
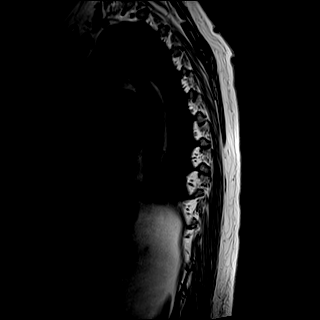

[Series 21: STIR · sagittal · 3.0mm · 0.53mm/px · 4 of 20 slices shown]
[im 1/20]
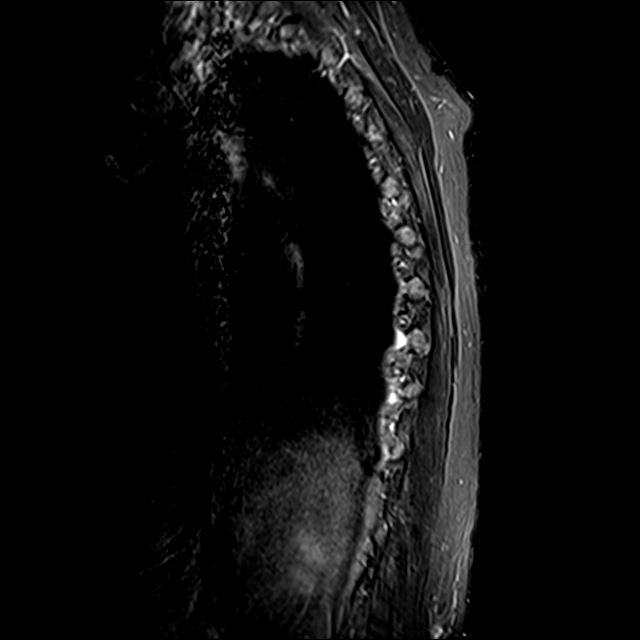
[im 4/20]
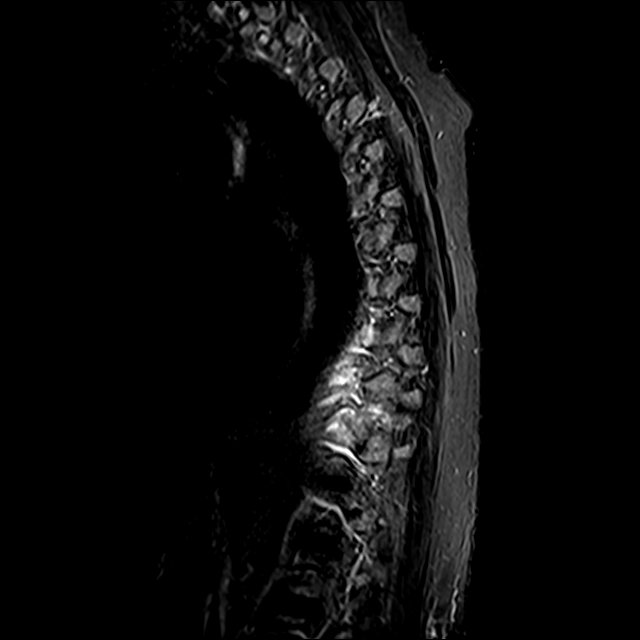
[im 8/20]
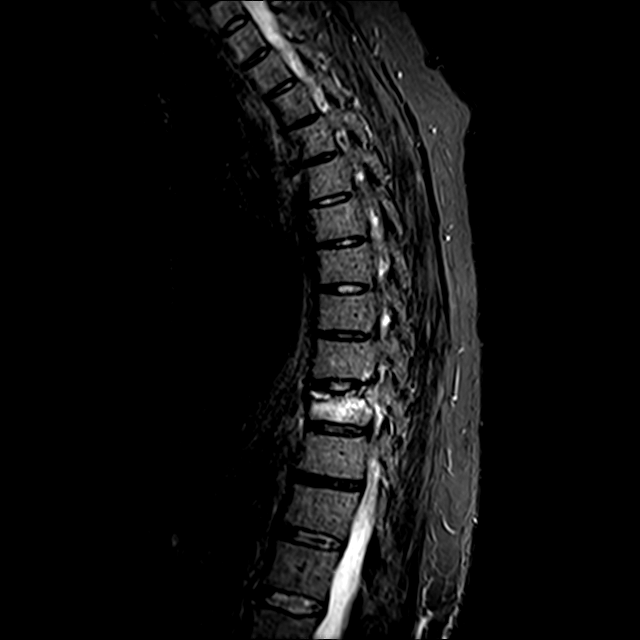
[im 12/20]
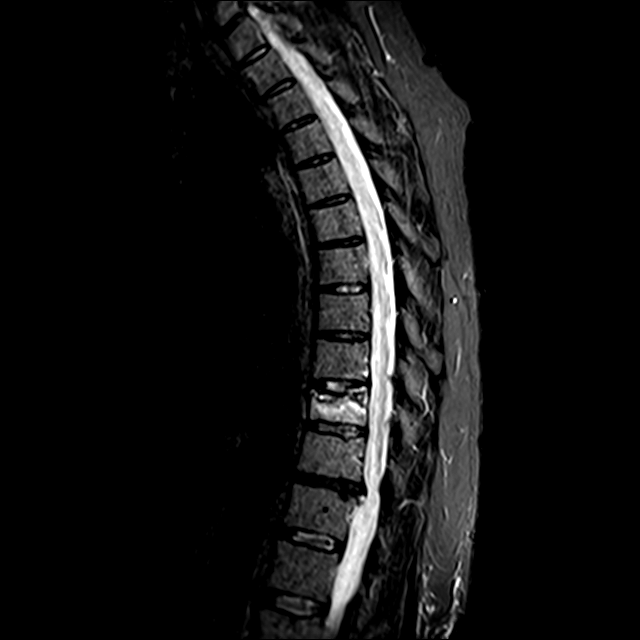

[Series 22: T2 · axial · 4.0mm · 0.59mm/px · z∈[-343,-121]mm · 9 of 39 slices shown (2 of 2)]
[im 1/39]
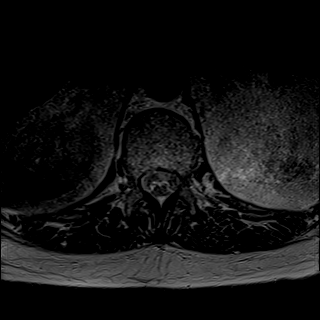
[im 7/39]
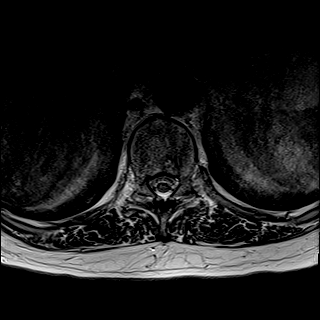
[im 11/39]
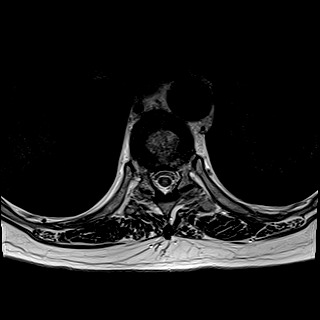
[im 18/39]
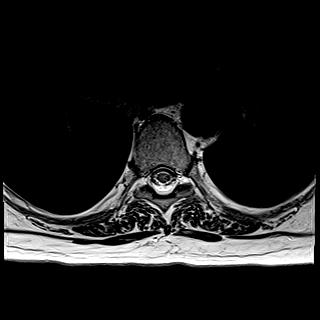
[im 21/39]
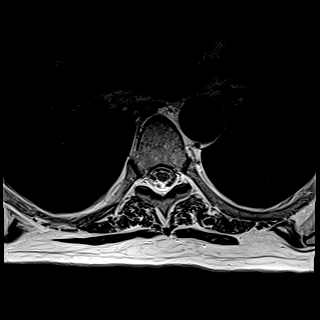
[im 28/39]
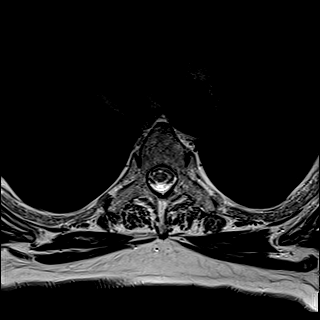
[im 32/39]
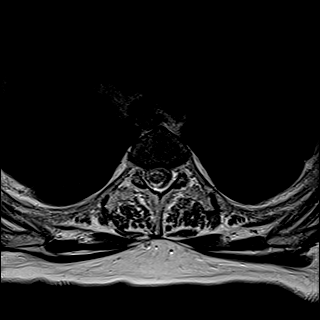
[im 35/39]
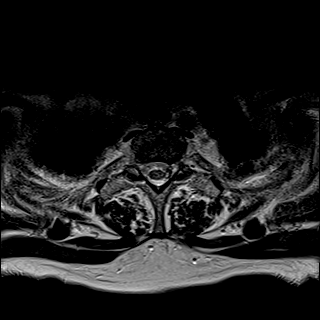
[im 39/39]
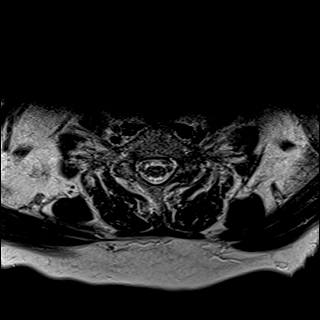

[31 of 48 positions shown; findings below may reference images not displayed]

FINDINGS: Alignment: No significant spondylolisthesis or bony retropulsion.

Vertebrae: Redemonstrated T10 superior endplate vertebral
compression fracture with unchanged 20-30% height loss. There is
edema throughout much of the T10 vertebral body, compatible with
acute/early subacute fracture. Fracture planes extend from the
superior endplate through the posterior aspect of the vertebral body
bilaterally (for instance as seen on series 21, image 7 and series
21, image 13). No significant bony retropulsion. Vertebral body
height is otherwise maintained. Multilevel vertebral body
hemangiomas.

Cord:  No spinal cord signal abnormality is identified.

Paraspinal and other soft tissues: No abnormality identified within
included portions of the thorax or upper abdomen/retroperitoneum.
Trace bilateral T10-T11 and left T11-T12 facet joint effusions may
be degenerative, or may reflect subtle facet joint capsular injury.

Disc levels:

No more than mild disc degeneration at any level. Shallow multilevel
disc bulges. Small central disc protrusions at T3-T4 and T12-L1.
Small left center disc protrusion at at site of posterior annular
fissure at T11-T12. Mild multilevel facet arthrosis/ligamentum
flavum hypertrophy within the lower thoracic spine. No significant
spinal canal stenosis or foraminal narrowing.
IMPRESSION: Redemonstrated acute/early subacute T10 superior endplate vertebral
compression fracture. 20-30% vertebral body height loss is unchanged
as compared to the thoracic spine radiographs of 07/01/2021.
Fracture planes extend from the superior endplate through the
posterior aspect of the vertebral body bilaterally. No significant
bony retropulsion.

Trace bilateral T10-T11 and left T11-T12 facet joint effusions may
be degenerative, or may reflect subtle facet joint capsular injury.

No other thoracic vertebral compression fracture.

Mild thoracic spondylosis, as described.

## 2022-10-30 ENCOUNTER — Telehealth: Payer: Self-pay | Admitting: Family

## 2022-10-30 MED ORDER — ESCITALOPRAM OXALATE 5 MG PO TABS: 5.0000 mg | ORAL_TABLET | Freq: Every day | ORAL | 3 refills | Status: DC

## 2022-10-30 NOTE — Telephone Encounter (Signed)
  Left message to return call to our office.  I copied where she was taking it. It is under her old medication just wanted you be able to know where to find it incase you need to find it.

## 2022-10-30 NOTE — Telephone Encounter (Signed)
Refilled

## 2022-10-30 NOTE — Telephone Encounter (Signed)
Can we call back for clarification I do not see this even in her old medication list?  Was she meaning to say some other kind of medication?

## 2022-10-30 NOTE — Telephone Encounter (Signed)
Pt called returning Morgan [name]'s missed call. Pt stated she is taking the meds & can bring bottle prescribed by [facility], if need be. Pt requested a call back @ [phone]

## 2022-10-30 NOTE — Telephone Encounter (Signed)
Patient called to get escitalopram a generic for lexapro refilled to  [id number] [place], [address] KickPhone: [phone] Fax: [phone]    Call back number [phone]

## 2022-10-31 ENCOUNTER — Telehealth: Payer: Self-pay

## 2022-10-31 NOTE — Telephone Encounter (Addendum)
[facility] [place] faxed refill request for atorvastatin 40 mg. Not on current med list;atorvastatin 40 mg on historical med list and per notation pt was not taking med at vascular surgery visit on 06/06/22.last refilled atorvastatin 40 mg #90 x 1 on 03/21/22 by Dr Einar Pheasant I tried to call pt to see if she is taking atorvastatin 40 mg but unable to reach pt by phone and left v/m requesting pt to cb to [facility]. Sending note to Red Christians FNP and [place] pool.    Pt called back and pt said she was not taking atorvastatin the cholesterol med. [facility] [place] had sent refill request. FYI to Red Christians FNP.

## 2022-11-02 ENCOUNTER — Other Ambulatory Visit: Payer: Self-pay | Admitting: Family

## 2022-11-02 DIAGNOSIS — F411 Generalized anxiety disorder: Secondary | ICD-10-CM

## 2022-11-02 DIAGNOSIS — F331 Major depressive disorder, recurrent, moderate: Secondary | ICD-10-CM

## 2022-11-02 DIAGNOSIS — R112 Nausea with vomiting, unspecified: Secondary | ICD-10-CM

## 2022-11-06 ENCOUNTER — Other Ambulatory Visit: Payer: Medicare HMO

## 2022-11-06 IMAGING — XA DG THORACIC SPINE 2V
2 series · 2 of 2 positions shown · non-contrast
Comparison: MRI of the thoracic spine 07/12/2021.

CLINICAL DATA: Surgery, elective K2S.X (GO8-U3-CM). Additional
history provided: T10 kyphoplasty. Provided fluoroscopy time 1
minutes, 31 seconds.

EXAM:
THORACIC SPINE 2 VIEWS; DG C-ARM 1-60 MIN

[Series 1: ortho standard · 1 of 1 slices shown (1 of 2)]
[im 1/1]
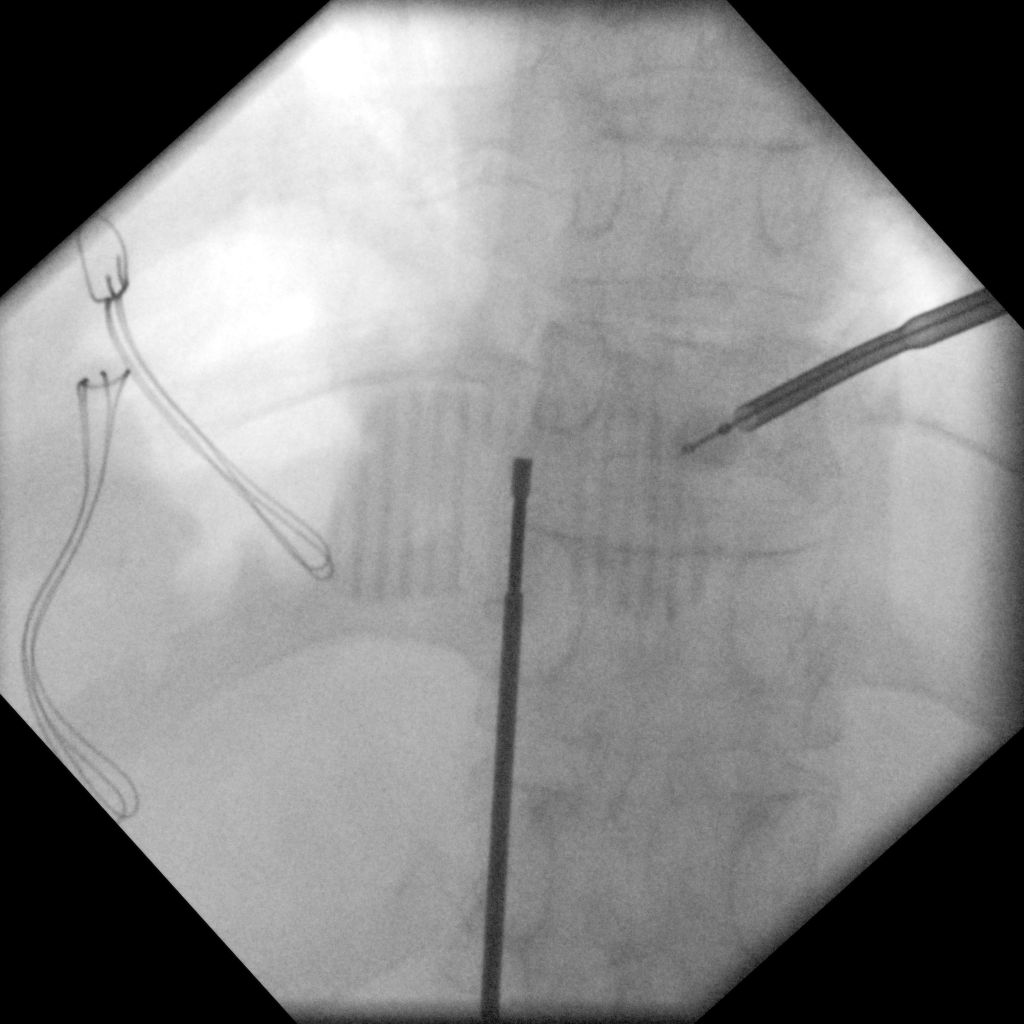

[Series 4: ortho standard · 1 of 1 slices shown (2 of 2)]
[im 1/1]
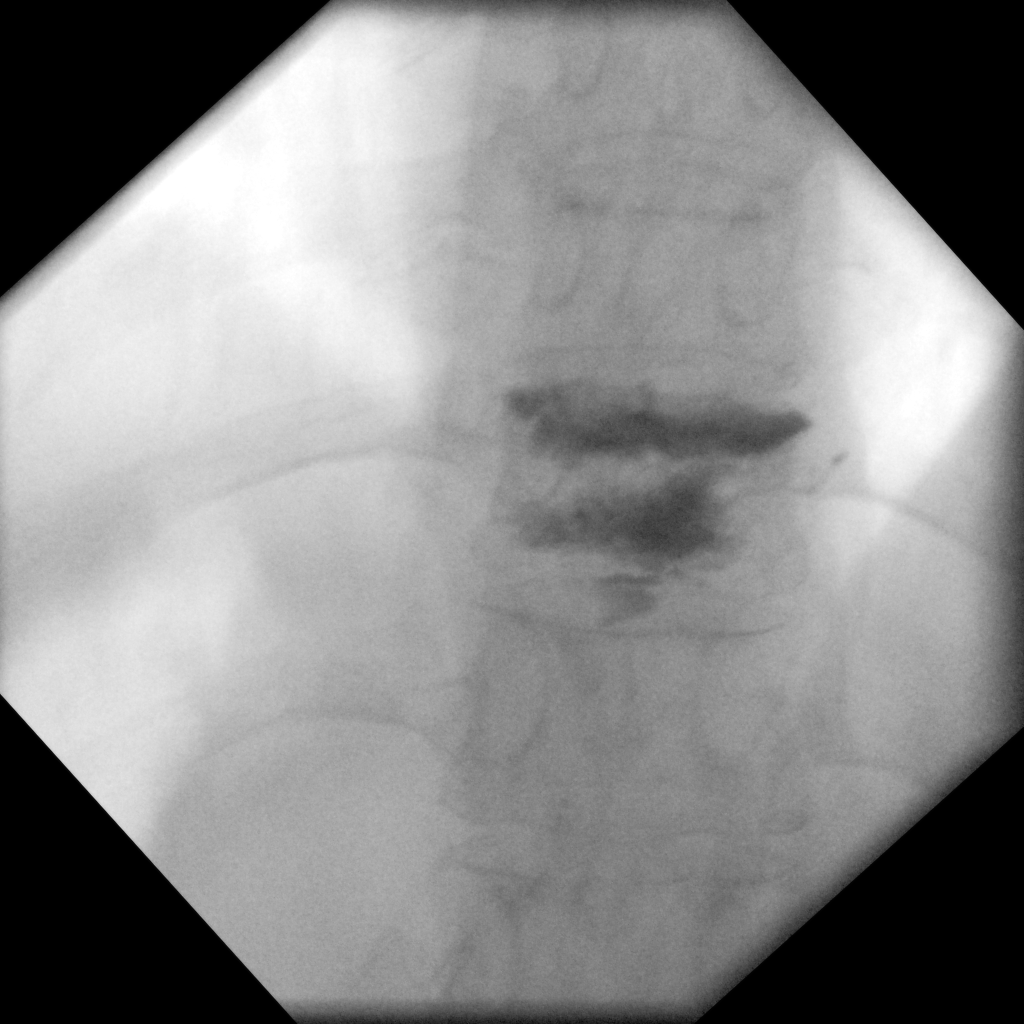

[2 of 2 positions shown; findings below may reference images not displayed]

FINDINGS: AP and lateral view intraprocedural fluoroscopic images of the
thoracic spine are submitted, 6 images total. On the provided
images, there are findings of interval kyphoplasty, presumably at
the T10 level at site of a known compression fracture (the exact
level is difficult to ascertain given the provided field of view).
There may be slight extension of kyphoplasty material into the
T10-T11 disc space.
IMPRESSION: Six intraprocedural fluoroscopic images from T10 kyphoplasty, as
described.

## 2022-11-06 IMAGING — XA DG C-ARM 1-60 MIN
2 series · 2 of 2 positions shown · non-contrast
Comparison: MRI of the thoracic spine 07/12/2021.

CLINICAL DATA: Surgery, elective K2S.X (GO8-U3-CM). Additional
history provided: T10 kyphoplasty. Provided fluoroscopy time 1
minutes, 31 seconds.

EXAM:
THORACIC SPINE 2 VIEWS; DG C-ARM 1-60 MIN

[Series 1: ortho standard · 1 of 1 slices shown (1 of 2)]
[im 1/1]
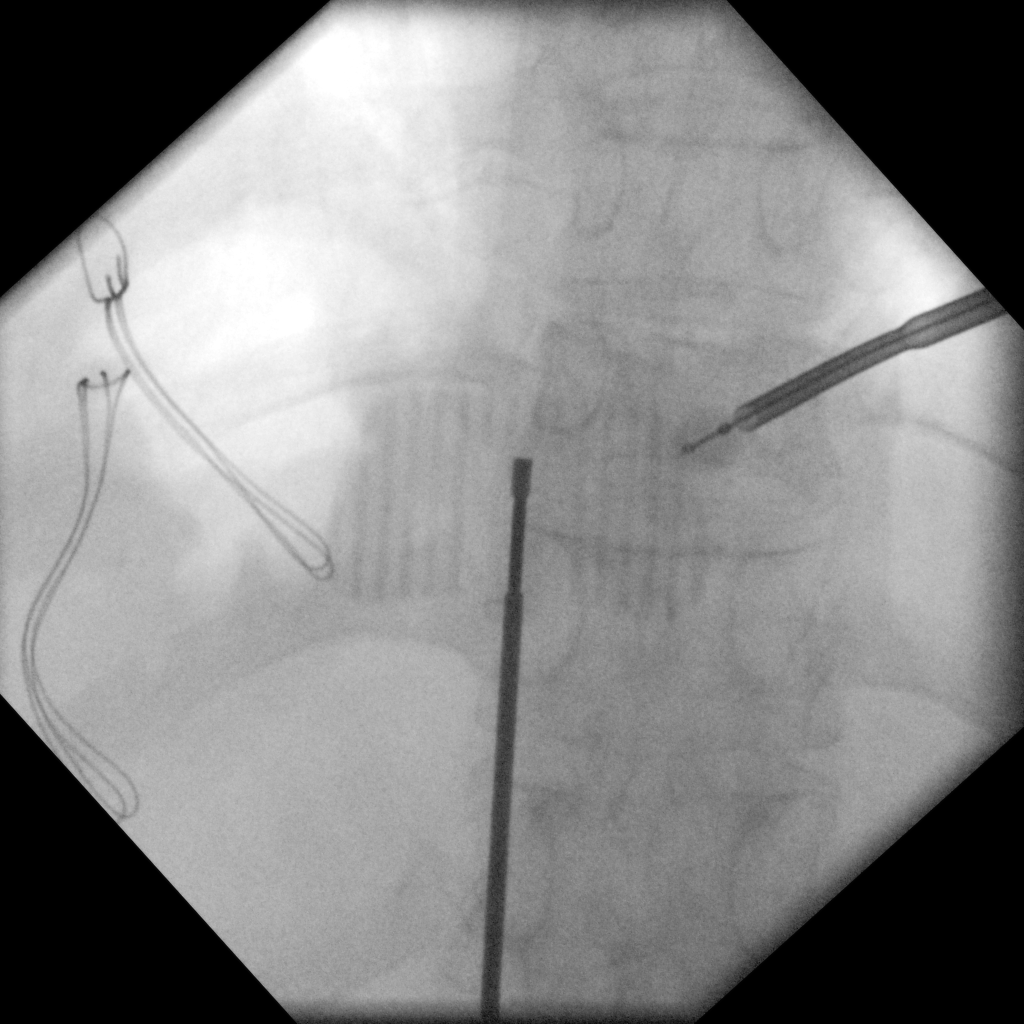

[Series 4: ortho standard · 1 of 1 slices shown (2 of 2)]
[im 1/1]
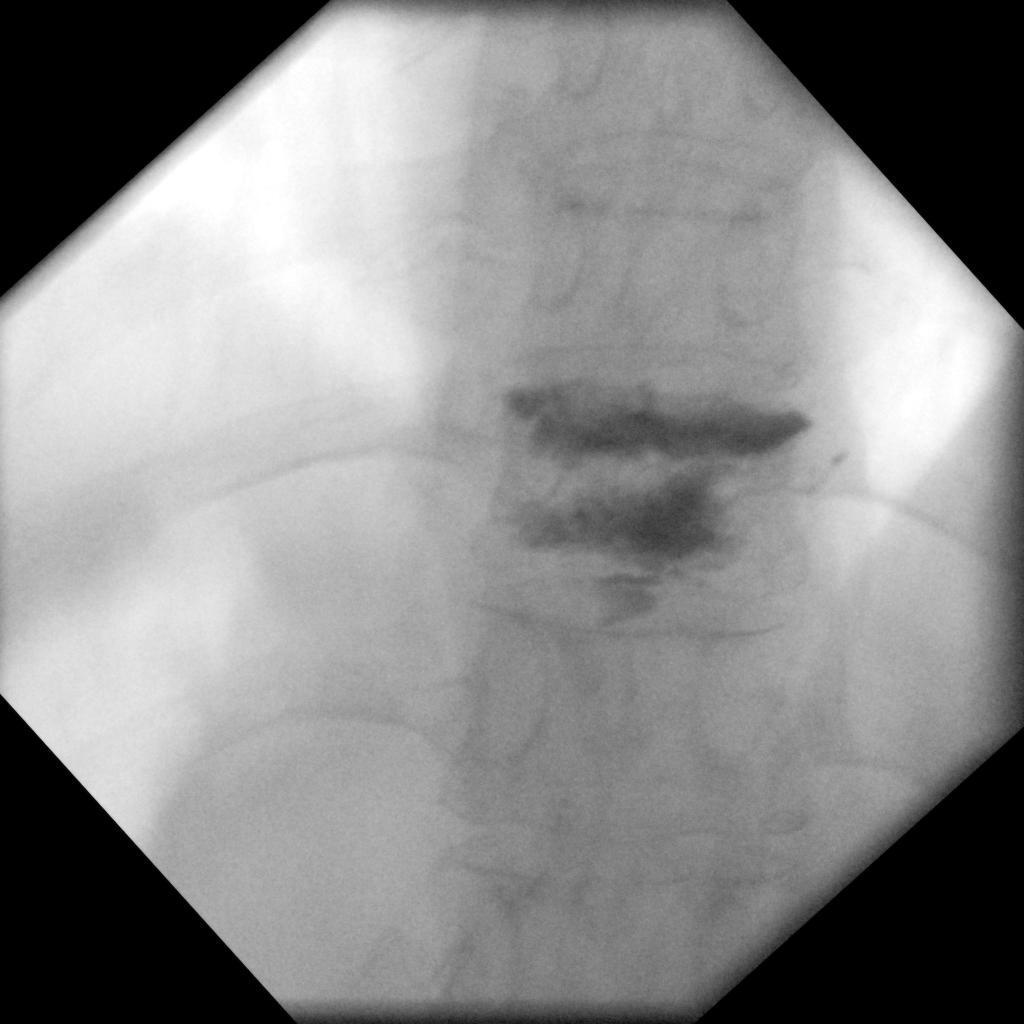

[2 of 2 positions shown; findings below may reference images not displayed]

FINDINGS: AP and lateral view intraprocedural fluoroscopic images of the
thoracic spine are submitted, 6 images total. On the provided
images, there are findings of interval kyphoplasty, presumably at
the T10 level at site of a known compression fracture (the exact
level is difficult to ascertain given the provided field of view).
There may be slight extension of kyphoplasty material into the
T10-T11 disc space.
IMPRESSION: Six intraprocedural fluoroscopic images from T10 kyphoplasty, as
described.

## 2022-11-07 ENCOUNTER — Ambulatory Visit
Admission: RE | Admit: 2022-11-07 | Discharge: 2022-11-07 | Disposition: A | Discharge status: Home / Self Care | Payer: Medicare HMO | Source: Ambulatory Visit | Attending: Gastroenterology | Admitting: Gastroenterology

## 2022-11-07 DIAGNOSIS — R1030 Lower abdominal pain, unspecified: Secondary | ICD-10-CM | POA: Insufficient documentation

## 2022-11-07 DIAGNOSIS — K5730 Diverticulosis of large intestine without perforation or abscess without bleeding: Secondary | ICD-10-CM | POA: Diagnosis not present

## 2022-11-07 MED ORDER — IOHEXOL 300 MG/ML  SOLN
100.0000 mL | Freq: Once | INTRAMUSCULAR | Status: AC | PRN
  Administered 2022-11-07: 100 mL via INTRAVENOUS

## 2022-11-26 ENCOUNTER — Encounter: Payer: Self-pay | Admitting: Oncology

## 2022-11-26 DIAGNOSIS — J449 Chronic obstructive pulmonary disease, unspecified: Secondary | ICD-10-CM | POA: Diagnosis not present

## 2022-11-26 DIAGNOSIS — I509 Heart failure, unspecified: Secondary | ICD-10-CM | POA: Diagnosis not present

## 2022-12-03 ENCOUNTER — Other Ambulatory Visit: Payer: Self-pay

## 2022-12-03 DIAGNOSIS — R0683 Snoring: Secondary | ICD-10-CM | POA: Diagnosis not present

## 2022-12-03 DIAGNOSIS — M5481 Occipital neuralgia: Secondary | ICD-10-CM | POA: Diagnosis not present

## 2022-12-03 DIAGNOSIS — I6523 Occlusion and stenosis of bilateral carotid arteries: Secondary | ICD-10-CM

## 2022-12-03 DIAGNOSIS — I701 Atherosclerosis of renal artery: Secondary | ICD-10-CM | POA: Diagnosis not present

## 2022-12-03 DIAGNOSIS — G4700 Insomnia, unspecified: Secondary | ICD-10-CM | POA: Diagnosis not present

## 2022-12-03 DIAGNOSIS — E569 Vitamin deficiency, unspecified: Secondary | ICD-10-CM | POA: Diagnosis not present

## 2022-12-03 DIAGNOSIS — Z8673 Personal history of transient ischemic attack (TIA), and cerebral infarction without residual deficits: Secondary | ICD-10-CM | POA: Diagnosis not present

## 2022-12-03 DIAGNOSIS — R55 Syncope and collapse: Secondary | ICD-10-CM | POA: Diagnosis not present

## 2022-12-03 DIAGNOSIS — E538 Deficiency of other specified B group vitamins: Secondary | ICD-10-CM | POA: Diagnosis not present

## 2022-12-03 DIAGNOSIS — G43419 Hemiplegic migraine, intractable, without status migrainosus: Secondary | ICD-10-CM | POA: Diagnosis not present

## 2022-12-04 ENCOUNTER — Encounter (INDEPENDENT_AMBULATORY_CARE_PROVIDER_SITE_OTHER): Payer: Self-pay | Admitting: Vascular Surgery

## 2022-12-04 ENCOUNTER — Ambulatory Visit (INDEPENDENT_AMBULATORY_CARE_PROVIDER_SITE_OTHER): Payer: Medicare HMO

## 2022-12-04 ENCOUNTER — Ambulatory Visit (INDEPENDENT_AMBULATORY_CARE_PROVIDER_SITE_OTHER): Payer: Medicare HMO | Admitting: Vascular Surgery

## 2022-12-04 VITALS — BP 159/89 | HR 86 | Resp 16 | Wt 181.4 lb

## 2022-12-04 DIAGNOSIS — G8929 Other chronic pain: Secondary | ICD-10-CM

## 2022-12-04 DIAGNOSIS — R109 Unspecified abdominal pain: Secondary | ICD-10-CM | POA: Diagnosis not present

## 2022-12-04 DIAGNOSIS — E785 Hyperlipidemia, unspecified: Secondary | ICD-10-CM | POA: Diagnosis not present

## 2022-12-04 DIAGNOSIS — I701 Atherosclerosis of renal artery: Secondary | ICD-10-CM

## 2022-12-04 DIAGNOSIS — I6523 Occlusion and stenosis of bilateral carotid arteries: Secondary | ICD-10-CM

## 2022-12-04 DIAGNOSIS — I10 Essential (primary) hypertension: Secondary | ICD-10-CM

## 2022-12-04 NOTE — Assessment & Plan Note (Signed)
blood pressure control important in reducing the progression of atherosclerotic disease. On appropriate oral medications.  

## 2022-12-04 NOTE — Assessment & Plan Note (Signed)
Negative workup for large vessel mesenteric ischemia previously.

## 2022-12-04 NOTE — Progress Notes (Signed)
MRN : [id number]  Morgan [name] is a 71 y.o. (1951/09/15) female who presents with chief complaint of  Chief Complaint  Patient presents with   Follow-up    Ultrasound follow up  .  History of Present Illness: Patient returns in follow-up of multiple vascular issues.  She continues to have abdominal pain without a clear etiology.  She has had a negative workup for mesenteric ischemia.  Her blood pressure control has remained fairly good.  Slightly elevated today but overall it has been running well.  No diminished kidney function to her.  Renal artery duplex today shows 1 to 59% left renal artery stenosis with mildly elevated velocities that do not appear quite hemodynamically significant.  This was in follow-up to a previous CT scan showing some degree of left renal artery stenosis. She is also followed for carotid disease.  She has not had any focal symptoms since her last visit. Specifically, the patient denies amaurosis fugax, speech or swallowing difficulties, or arm or leg weakness or numbness. Carotid duplex today reveals stable stenosis just into the 40 to 59% range on the right and in the 1 to 39% range on the left.  Current Outpatient Medications  Medication Sig Dispense Refill   acetaminophen (TYLENOL) 325 MG tablet Take 2 tablets (650 mg total) by mouth every 4 (four) hours as needed for mild pain (or temp > 37.5 C (99.5 F)).     carbidopa-levodopa (SINEMET IR) 25-100 MG tablet Take 2 tablets by mouth 3 (three) times daily.     escitalopram (LEXAPRO) 5 MG tablet Take 1 tablet (5 mg total) by mouth daily. 90 tablet 3   hydrOXYzine (ATARAX) 25 MG tablet TAKE 1 TABLET (25 MG TOTAL) BY MOUTH 2 (TWO) TIMES DAILY AS NEEDED FOR ANXIETY OR NAUSEA. 180 tablet 1   ibuprofen (ADVIL) 400 MG tablet Take 1 tablet (400 mg total) by mouth every 8 (eight) hours as needed for headache or moderate pain. 20 tablet 0   ondansetron (ZOFRAN-ODT) 4 MG disintegrating tablet Take 1 tablet (4 mg total) by  mouth every 8 (eight) hours as needed for nausea or vomiting. 20 tablet 0   OXYGEN Inhale 3 L/min into the lungs See admin instructions. 3 L/min at bedtime and as needed for shortness of breath throughout the day     potassium chloride SA (KLOR-CON M) 20 MEQ tablet Take 1 tablet (20 mEq total) by mouth daily. 30 tablet 3   promethazine (PHENERGAN) 12.5 MG tablet Take 1 tablet (12.5 mg total) by mouth every 12 (twelve) hours as needed for nausea or vomiting. 20 tablet 0   topiramate (TOPAMAX) 50 MG tablet Take 1 tablet (50 mg total) by mouth at bedtime. 90 tablet 0   pantoprazole (PROTONIX) 40 MG tablet Take 1 tablet (40 mg total) by mouth 2 (two) times daily for 14 days. 28 tablet 0   No current facility-administered medications for this visit.    Past Medical History:  Diagnosis Date   Allergy    Anemia    Anxiety    Arthritis    CHF (congestive heart failure) (HCC)    COPD (chronic obstructive pulmonary disease) (HCC)    Depression    GERD (gastroesophageal reflux disease)    Hypertension    MVP (mitral valve prolapse)    Parkinson disease     Past Surgical History:  Procedure Laterality Date   ABDOMINAL HYSTERECTOMY     APPENDECTOMY     BALLOON DILATION N/A 04/21/2020  Procedure: BALLOON DILATION;  Surgeon: Milus Banister, MD;  Location: [facility];  Service: Endoscopy;  Laterality: N/A;  pyloric/ GI   BALLOON DILATION N/A 07/04/2020   Procedure: BALLOON DILATION;  Surgeon: Mauri Pole, MD;  Location: [facility];  Service: Endoscopy;  Laterality: N/A;   BIOPSY  04/21/2020   Procedure: BIOPSY;  Surgeon: Milus Banister, MD;  Location: [facility];  Service: Endoscopy;;   BIOPSY  07/04/2020   Procedure: BIOPSY;  Surgeon: Mauri Pole, MD;  Location: [facility];  Service: Endoscopy;;  EGD and COLON   BREAST SURGERY Right 1988   tumor removal   CHOLECYSTECTOMY N/A 05/18/2020   Procedure: LAPAROSCOPIC CHOLECYSTECTOMY;  Surgeon: Johnathan Hausen, MD;  Location:  [facility];  Service: General;  Laterality: N/A;   COLONOSCOPY WITH PROPOFOL N/A 07/04/2020   Procedure: COLONOSCOPY WITH PROPOFOL;  Surgeon: Mauri Pole, MD;  Location: [facility];  Service: Endoscopy;  Laterality: N/A;   COLONOSCOPY WITH PROPOFOL N/A 11/03/2021   Procedure: COLONOSCOPY WITH PROPOFOL;  Surgeon: Lesly Rubenstein, MD;  Location: [facility];  Service: Endoscopy;  Laterality: N/A;   ESOPHAGEAL DILATION  08/11/2020   Procedure: PYLORIC DILATION;  Surgeon: Doran Stabler, MD;  Location: [facility];  Service: Gastroenterology;;   ESOPHAGOGASTRODUODENOSCOPY (EGD) WITH PROPOFOL N/A 04/21/2020   Procedure: ESOPHAGOGASTRODUODENOSCOPY (EGD) WITH PROPOFOL;  Surgeon: Milus Banister, MD;  Location: [facility];  Service: Endoscopy;  Laterality: N/A;   ESOPHAGOGASTRODUODENOSCOPY (EGD) WITH PROPOFOL N/A 07/04/2020   Procedure: ESOPHAGOGASTRODUODENOSCOPY (EGD) WITH PROPOFOL;  Surgeon: Mauri Pole, MD;  Location: [facility];  Service: Endoscopy;  Laterality: N/A;   ESOPHAGOGASTRODUODENOSCOPY (EGD) WITH PROPOFOL N/A 08/11/2020   Procedure: ESOPHAGOGASTRODUODENOSCOPY (EGD) WITH PROPOFOL;  Surgeon: Doran Stabler, MD;  Location: [facility];  Service: Gastroenterology;  Laterality: N/A;   ESOPHAGOGASTRODUODENOSCOPY (EGD) WITH PROPOFOL N/A 11/03/2021   Procedure: ESOPHAGOGASTRODUODENOSCOPY (EGD) WITH PROPOFOL;  Surgeon: Lesly Rubenstein, MD;  Location: [facility];  Service: Endoscopy;  Laterality: N/A;   ESOPHAGOGASTRODUODENOSCOPY (EGD) WITH PROPOFOL N/A 05/07/2022   Procedure: ESOPHAGOGASTRODUODENOSCOPY (EGD) WITH PROPOFOL;  Surgeon: Lin Landsman, MD;  Location: [facility];  Service: Gastroenterology;  Laterality: N/A;   KNEE SURGERY  2005   2 times left   KNEE SURGERY Right    KYPHOPLASTY N/A 07/20/2021   Procedure: T 10KYPHOPLASTY;  Surgeon: Hessie Knows, MD;  Location: [facility];  Service: Orthopedics;  Laterality: N/A;   KYPHOPLASTY N/A 08/23/2021    Procedure: T9 KYPHOPLASTY;  Surgeon: Hessie Knows, MD;  Location: [facility];  Service: Orthopedics;  Laterality: N/A;   POLYPECTOMY  07/04/2020   Procedure: POLYPECTOMY;  Surgeon: Mauri Pole, MD;  Location: [facility];  Service: Endoscopy;;   TUBAL LIGATION       Social History   Tobacco Use   Smoking status: Former    Packs/day: 1.00    Years: 15.00    Total pack years: 15.00    Types: Cigarettes    Quit date: 09/12/2017    Years since quitting: 5.2   Smokeless tobacco: Never  Vaping Use   Vaping Use: Never used  Substance Use Topics   Alcohol use: Not Currently   Drug use: No      Family History  Problem Relation Age of Onset   Breast cancer Mother 29   Ovarian cancer Mother 75   Brain cancer Mother    Hypertension Father    Hyperlipidemia Father    Heart disease Father    Colon cancer Father  diagnosed in his 86s   Breast cancer Maternal Grandmother 94   Diabetes Paternal Grandmother    Breast cancer Paternal Grandmother 50   Other Son        drug over dose   Colon cancer Maternal Grandfather        dx in his 39s   Liver disease Maternal Grandfather    Breast cancer Maternal Aunt 60   Breast cancer Maternal Aunt 60   Colon cancer Maternal Uncle    Stomach cancer Neg Hx    Pancreatic cancer Neg Hx    Esophageal cancer Neg Hx      Allergies  Allergen Reactions   Meperidine Hives and Anaphylaxis   Strawberry Extract Hives and Swelling   Sucralfate Nausea Only and Other (See Comments)    Severe nausea Other reaction(s): Other (See Comments) Severe nausea Severe nausea   Wasp Venom Hives    REVIEW OF SYSTEMS (Negative unless checked)   Constitutional: '[]'$ Weight loss  '[]'$ Fever  '[]'$ Chills Cardiac: '[]'$ Chest pain   '[]'$ Chest pressure   '[]'$ Palpitations   '[]'$ Shortness of breath when laying flat   '[]'$ Shortness of breath at rest   '[]'$ Shortness of breath with exertion. Vascular:  '[]'$ Pain in legs with walking   '[]'$ Pain in legs at rest   '[]'$ Pain in legs when  laying flat   '[]'$ Claudication   '[]'$ Pain in feet when walking  '[]'$ Pain in feet at rest  '[]'$ Pain in feet when laying flat   '[]'$ History of DVT   '[]'$ Phlebitis   '[]'$ Swelling in legs   '[]'$ Varicose veins   '[]'$ Non-healing ulcers Pulmonary:   '[x]'$ Uses home oxygen   '[]'$ Productive cough   '[]'$ Hemoptysis   '[]'$ Wheeze  '[x]'$ COPD   '[]'$ Asthma Neurologic:  '[]'$ Dizziness  '[]'$ Blackouts   '[]'$ Seizures   '[]'$ History of stroke   '[]'$ History of TIA  '[]'$ Aphasia   '[]'$ Temporary blindness   '[]'$ Dysphagia   '[]'$ Weakness or numbness in arms   '[]'$ Weakness or numbness in legs Musculoskeletal:  '[x]'$ Arthritis   '[]'$ Joint swelling   '[]'$ Joint pain   '[]'$ Low back pain Hematologic:  '[]'$ Easy bruising  '[]'$ Easy bleeding   '[]'$ Hypercoagulable state   '[]'$ Anemic   Gastrointestinal:  '[]'$ Blood in stool   '[]'$ Vomiting blood  '[x]'$ Gastroesophageal reflux/heartburn   '[x]'$ Abdominal pain Genitourinary:  '[]'$ Chronic kidney disease   '[]'$ Difficult urination  '[]'$ Frequent urination  '[]'$ Burning with urination   '[]'$ Hematuria Skin:  '[]'$ Rashes   '[]'$ Ulcers   '[]'$ Wounds Psychological:  '[x]'$ History of anxiety   '[x]'$  History of major depression.  Physical Examination  Vitals:   12/04/22 0847  BP: (!) 159/89  Pulse: 86  Resp: 16  Weight: 181 lb 6.4 oz (82.3 kg)   Body mass index is 23.93 kg/m. Gen:  WD/WN, NAD Head: /AT, No temporalis wasting. Ear/Nose/Throat: Hearing grossly intact, nares w/o erythema or drainage, trachea midline Eyes: Conjunctiva clear. Sclera non-icteric Neck: Supple.  No bruit  Pulmonary:  Good air movement, equal and clear to auscultation bilaterally.  Cardiac: RRR, No JVD Vascular:  Vessel Right Left  Radial Palpable Palpable       Musculoskeletal: M/S 5/5 throughout.  No deformity or atrophy. No edema. Neurologic: CN 2-12 intact. Sensation grossly intact in extremities.  Symmetrical.  Speech is fluent. Motor exam as listed above. Psychiatric: Judgment intact, Mood & affect appropriate for pt's clinical situation. Dermatologic: No rashes or ulcers noted.  No cellulitis or open  wounds.     CBC Lab Results  Component Value Date   WBC 9.9 09/23/2022   HGB 10.8 (L) 09/23/2022   HCT 34.8 (L) 09/23/2022  MCV 82.3 09/23/2022   PLT 313 09/23/2022    BMET    Component Value Date/Time   NA 135 10/17/2022 1517   NA 143 12/26/2018 0000   NA 139 09/18/2012 2000   K 4.2 10/17/2022 1517   K 3.3 (L) 09/18/2012 2000   CL 102 10/17/2022 1517   CL 99 09/18/2012 2000   CO2 21 10/17/2022 1517   CO2 33 (H) 09/18/2012 2000   GLUCOSE 110 (H) 10/17/2022 1517   GLUCOSE 111 (H) 09/18/2012 2000   BUN 19 10/17/2022 1517   BUN 16 12/26/2018 0000   BUN 13 09/18/2012 2000   CREATININE 0.87 10/17/2022 1517   CREATININE 0.72 09/18/2012 2000   CALCIUM 9.2 10/17/2022 1517   CALCIUM 9.4 09/18/2012 2000   GFRNONAA >60 09/23/2022 1105   GFRNONAA >60 09/18/2012 2000   GFRAA >60 09/02/2020 1658   GFRAA >60 09/18/2012 2000   CrCl cannot be calculated (Patient's most recent lab result is older than the maximum 21 days allowed.).  COAG Lab Results  Component Value Date   INR 1.0 01/18/2022   INR 1.0 10/20/2019   INR 0.96 10/16/2018    Radiology CT ENTERO ABD/PELVIS W CONTAST  Result Date: 11/09/2022 CLINICAL DATA:  Lower abdominal pain and diarrhea. EXAM: CT ABDOMEN AND PELVIS WITH CONTRAST (ENTEROGRAPHY) TECHNIQUE: Multidetector CT of the abdomen and pelvis during bolus administration of intravenous contrast. Negative oral contrast was given. RADIATION DOSE REDUCTION: This exam was performed according to the departmental dose-optimization program which includes automated exposure control, adjustment of the mA and/or kV according to patient size and/or use of iterative reconstruction technique. CONTRAST:  161m OMNIPAQUE IOHEXOL 300 MG/ML  SOLN COMPARISON:  CTA 09/22/2022. FINDINGS: Lower chest:  Calcified granuloma noted in the left lung base. Hepatobiliary: No suspicious focal abnormality within the liver parenchyma. Gallbladder surgically absent. No intrahepatic or  extrahepatic biliary dilation. Pancreas: No focal mass lesion. No dilatation of the main duct. No intraparenchymal cyst. No peripancreatic edema. Spleen: No splenomegaly. No focal mass lesion. Adrenals/Urinary Tract: No adrenal nodule or mass. Kidneys unremarkable. Stomach/Bowel: No wall thickening or abnormal mural enhancement in the stomach or duodenum. No wall thickening or abnormal mural enhancement in the jejunum or ileum terminal ileum normal. No evidence for inflammatory or fibrous small bowel stricture. No wall thickening or abnormal mural enhancement in the colon Diverticular changes are noted in the left colon without evidence of diverticulitis. No evidence for edema or inflammation in the mesentery. Vascular/Lymphatic: There is moderate atherosclerotic calcification of the abdominal aorta without aneurysm. There is no gastrohepatic or hepatoduodenal ligament lymphadenopathy. No retroperitoneal or mesenteric lymphadenopathy. No pelvic sidewall lymphadenopathy. Reproductive: Hysterectomy.  There is no adnexal mass. Other: No intraperitoneal free fluid. Musculoskeletal: No worrisome lytic or sclerotic osseous abnormality. Evidence of prior vertebral augmentation at T8 and T9. IMPRESSION: 1. No acute findings in the abdomen or pelvis. Specifically, no findings to explain the patient's history of lower abdominal pain and diarrhea. 2. No wall thickening or abnormal mural enhancement in the stomach, small bowel, or colon. No findings to suggest inflammatory or fibrous small bowel stricture. 3. Left colonic diverticulosis without diverticulitis. 4.  Aortic Atherosclerosis (ICD10-I70.0). Electronically Signed   By: EMisty StanleyM.D.   On: 11/09/2022 17:37     Assessment/Plan Stenosis of left renal artery (HCC) Renal artery duplex today shows 1 to 59% left renal artery stenosis with mildly elevated velocities that do not appear quite hemodynamically significant.  This was in follow-up to a previous CT scan  showing some degree of left renal artery stenosis.  Without severe renovascular hypertension or ischemic nephropathy, I would not recommend any intervention at this time or further evaluation.  Plan follow-up duplex in 1 year.  Essential hypertension blood pressure control important in reducing the progression of atherosclerotic disease. On appropriate oral medications.   Carotid stenosis Carotid duplex today reveals stable stenosis just into the 40 to 59% range on the right and in the 1 to 39% range on the left.  No major changes.  Continue to follow on an annual basis.  Chronic abdominal pain Negative workup for large vessel mesenteric ischemia previously.    Leotis Pain, MD  12/04/2022 9:36 AM    This note was created with Dragon medical transcription system.  Any errors from dictation are purely unintentional

## 2022-12-04 NOTE — Assessment & Plan Note (Signed)
lipid control important in reducing the progression of atherosclerotic disease.   

## 2022-12-04 NOTE — Assessment & Plan Note (Signed)
Carotid duplex today reveals stable stenosis just into the 40 to 59% range on the right and in the 1 to 39% range on the left.  No major changes.  Continue to follow on an annual basis.

## 2022-12-04 NOTE — Assessment & Plan Note (Signed)
Renal artery duplex today shows 1 to 59% left renal artery stenosis with mildly elevated velocities that do not appear quite hemodynamically significant.  This was in follow-up to a previous CT scan showing some degree of left renal artery stenosis.  Without severe renovascular hypertension or ischemic nephropathy, I would not recommend any intervention at this time or further evaluation.  Plan follow-up duplex in 1 year.

## 2022-12-05 IMAGING — MR MR THORACIC SPINE W/O CM
7 of 8 series · 32 of 48 positions shown · non-contrast
Comparison: MRI of the thoracic spine 07/12/2021.

CLINICAL DATA: Closed wedge compression fracture of T10 vertebra,
initial encounter. Additional history provided: Patient reports fall
2 months ago with kyphoplasty 1 month ago, 3 days ago patient bent
over and felt a "pop" with continuous pain since that time.

EXAM:
MRI THORACIC SPINE WITHOUT CONTRAST
TECHNIQUE: Multiplanar, multisequence MR imaging of the thoracic spine was
performed. No intravenous contrast was administered.

[Series 16: T1 · sagittal · 5.0mm · 1.41mm/px · 3 of 9 slices shown (1 of 4)]
[im 1/9]
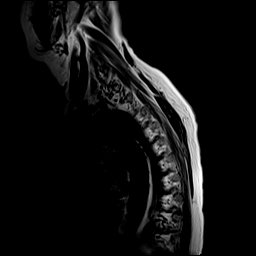
[im 5/9]
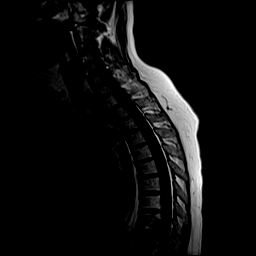
[im 9/9]
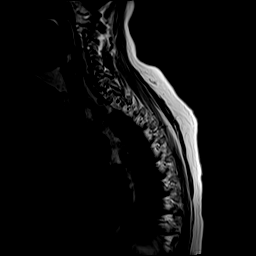

[Series 17: T1 · sagittal · 5.0mm · 1.41mm/px · 3 of 9 slices shown (2 of 4)]
[im 1/9]
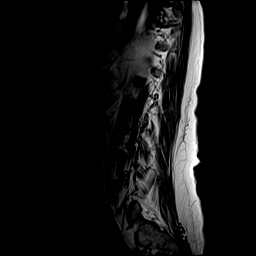
[im 5/9]
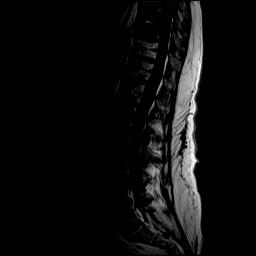
[im 9/9]
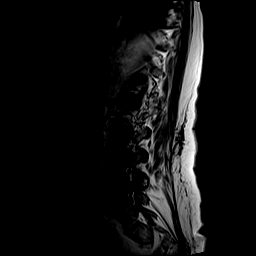

[Series 18: T1 · sagittal · 6.0mm · 1.41mm/px · 3 of 9 slices shown (3 of 4)]
[im 1/9]
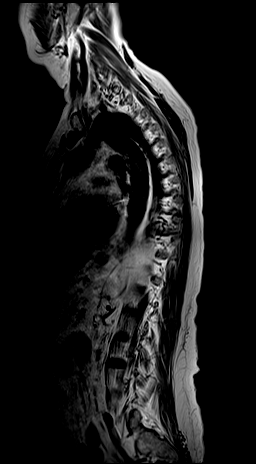
[im 5/9]
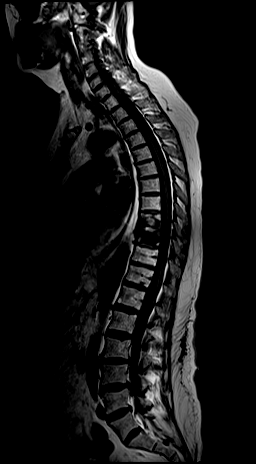
[im 9/9]
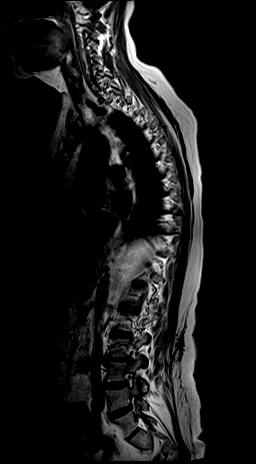

[Series 19: T2 · sagittal · 3.0mm · 1.12mm/px · 5 of 17 slices shown (1 of 2)]
[im 1/17]
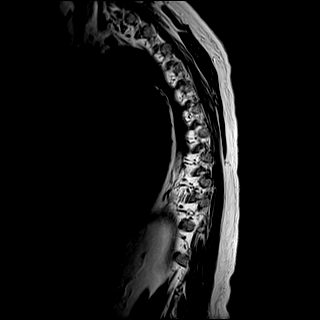
[im 5/17]
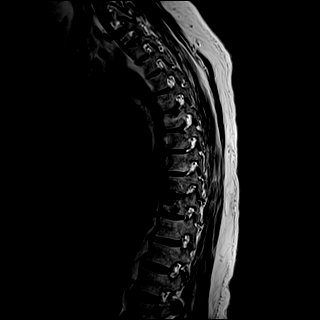
[im 9/17]
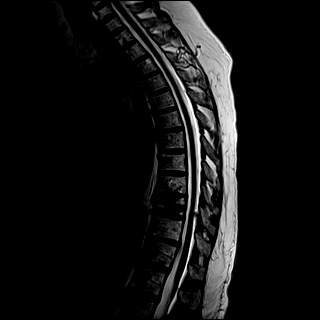
[im 13/17]
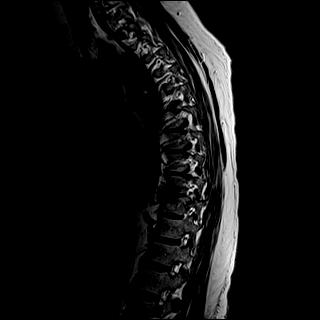
[im 17/17]
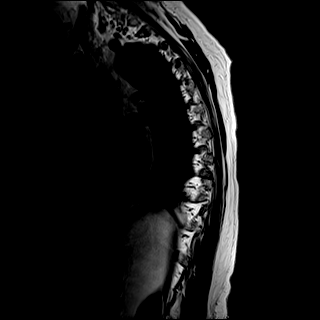

[Series 20: T1 · sagittal · 3.0mm · 1.06mm/px · 5 of 17 slices shown (4 of 4)]
[im 1/17]
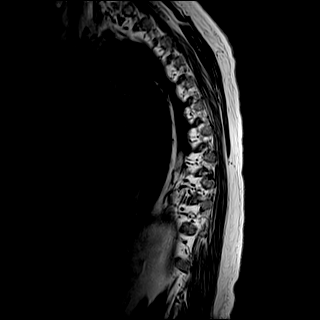
[im 5/17]
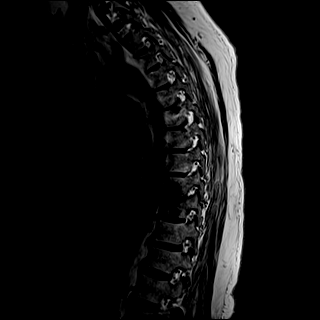
[im 9/17]
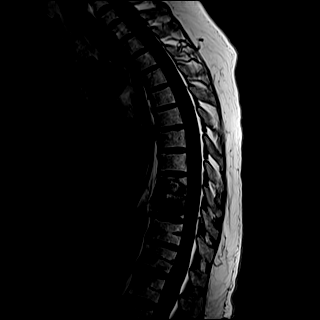
[im 13/17]
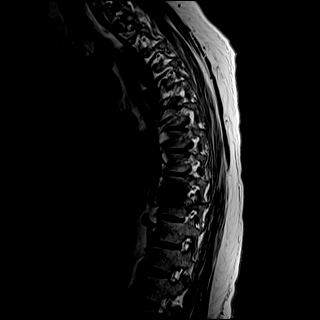
[im 17/17]
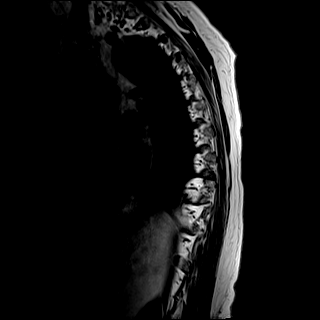

[Series 21: STIR · sagittal · 3.0mm · 0.53mm/px · 4 of 17 slices shown]
[im 1/17]
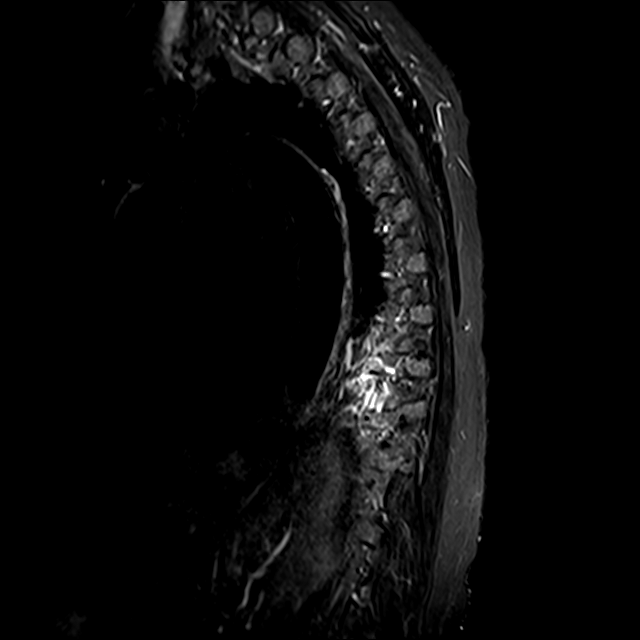
[im 5/17]
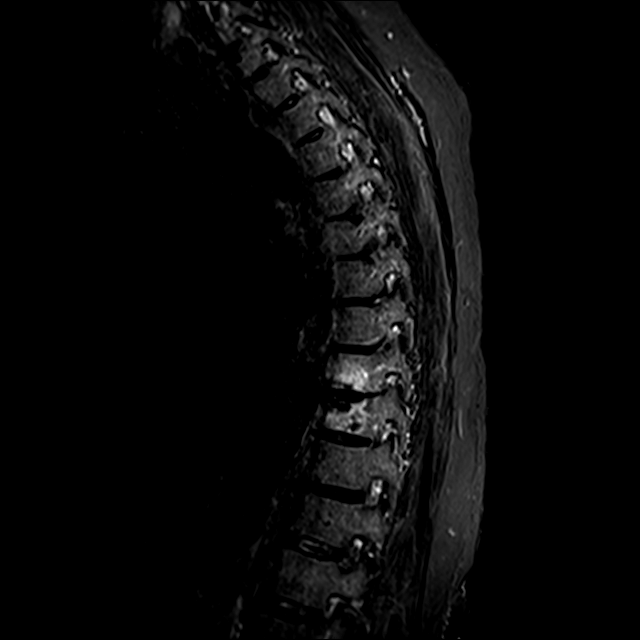
[im 9/17]
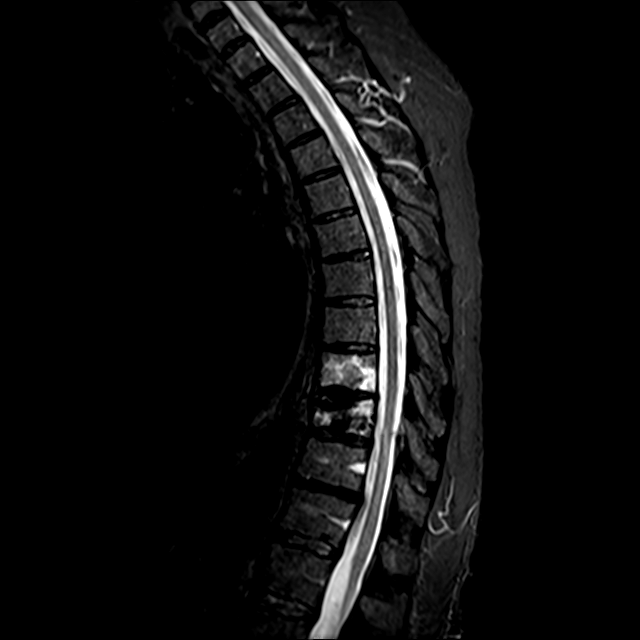
[im 13/17]
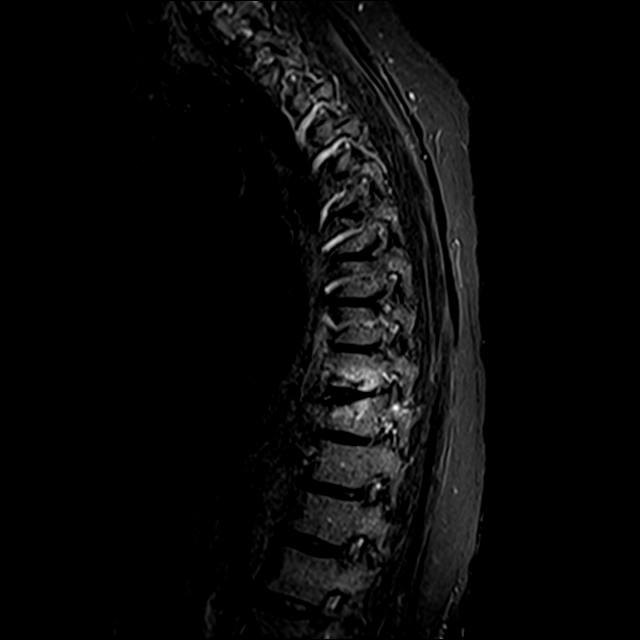

[Series 22: T2 · axial · 4.0mm · 0.59mm/px · z∈[-277,-85]mm · 9 of 42 slices shown (2 of 2)]
[im 1/42]
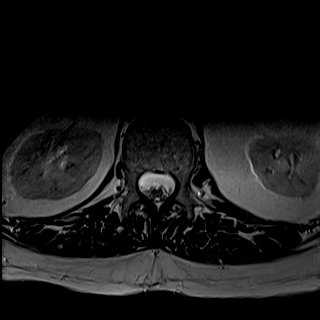
[im 8/42]
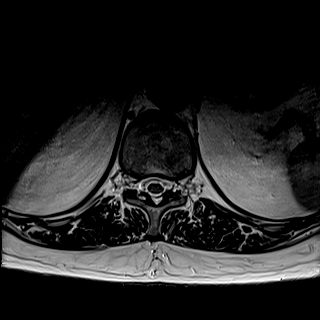
[im 12/42]
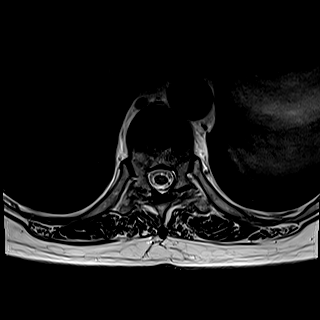
[im 19/42]
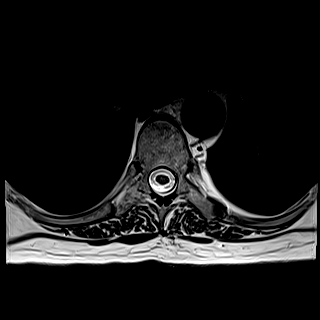
[im 23/42]
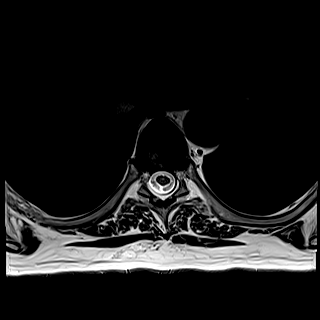
[im 30/42]
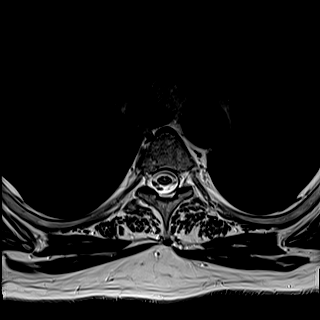
[im 34/42]
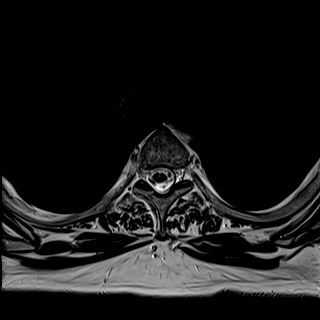
[im 38/42]
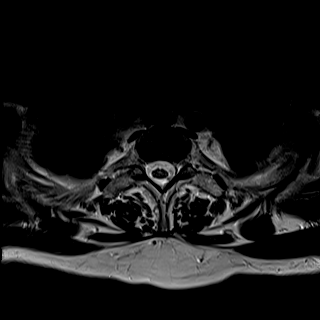
[im 42/42]
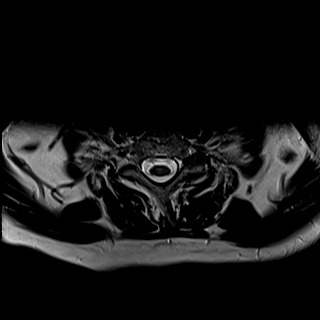

[32 of 48 positions shown; findings below may reference images not displayed]

FINDINGS: Alignment: No significant spondylolisthesis. No significant bony
retropulsion.

Vertebrae: Redemonstrated subacute T10 vertebral compression
fracture with interval kyphoplasty at this level. There is
persistent edema throughout much of the T10 vertebral body,
extending into the right posterior elements. T10 vertebral body
height loss is similar to slightly progressed as compared to the
prior examination (30%). New from the prior examination, there is a
compression fracture of the T9 inferior endplate with subtle
vertebral body height loss. Horizontally oriented fracture plane
within the inferior T9 vertebral body. There is edema throughout
much of the T9 vertebral body. Superimposed T9 vertebral body
hemangioma. Elsewhere, no significant marrow edema or focal
suspicious osseous lesion is identified. Vertebral body height is
otherwise maintained.

Cord:  No spinal cord signal abnormality is identified.

Paraspinal and other soft tissues: No abnormality identified within
included portions of the abdomen/retroperitoneum. No abnormality
identified within included portions of the thorax.

Disc levels:

Numerous mild degeneration at any level. Shallow multilevel disc
bulges. Redemonstrated small central disc protrusions at T3-T4 and
T12-L1 (without significant spinal canal stenosis). Redemonstrated
broad-based left central disc protrusion at T11-12, mildly effacing
the ventral thecal sac (without spinal cord mass effect).

Impressions 1 and 2 will be called to the ordering clinician or
representative by the Radiologist Assistant, and communication
documented in the PACS or [REDACTED].
IMPRESSION: Since the prior thoracic spine MRI of 07/12/2021, there has
developed an acute T9 inferior endplate compression fracture with
mild vertebral body height loss (10% or less). Edema throughout much
of the T9 vertebral body. No significant bony retropulsion.

Redemonstrated subacute T10 vertebral compression fracture with
interval kyphoplasty at this level. Persistent edema throughout much
of the T10 vertebral body, extending into the posterior elements on
the right. T10 vertebral body height loss is similar to slightly
progressed from the prior examination (30%).

Unchanged thoracic spondylosis, as described.

## 2022-12-10 IMAGING — XA DG THORACIC SPINE 2V
1 series · 4 of 4 positions shown · non-contrast
Comparison: None.

CLINICAL DATA: Surgery.  T9 kyphoplasty.

EXAM:
DG C-ARM 1-60 MIN; THORACIC SPINE 2 VIEWS
FLUOROSCOPY TIME:  Radiation Exposure Index (if provided by the
fluoroscopic device): 4.4 mGy.
Number of Acquired Spot Images: 2

[Series 3: ortho standard · 4 of 9 frames shown]
[frame 1/9]
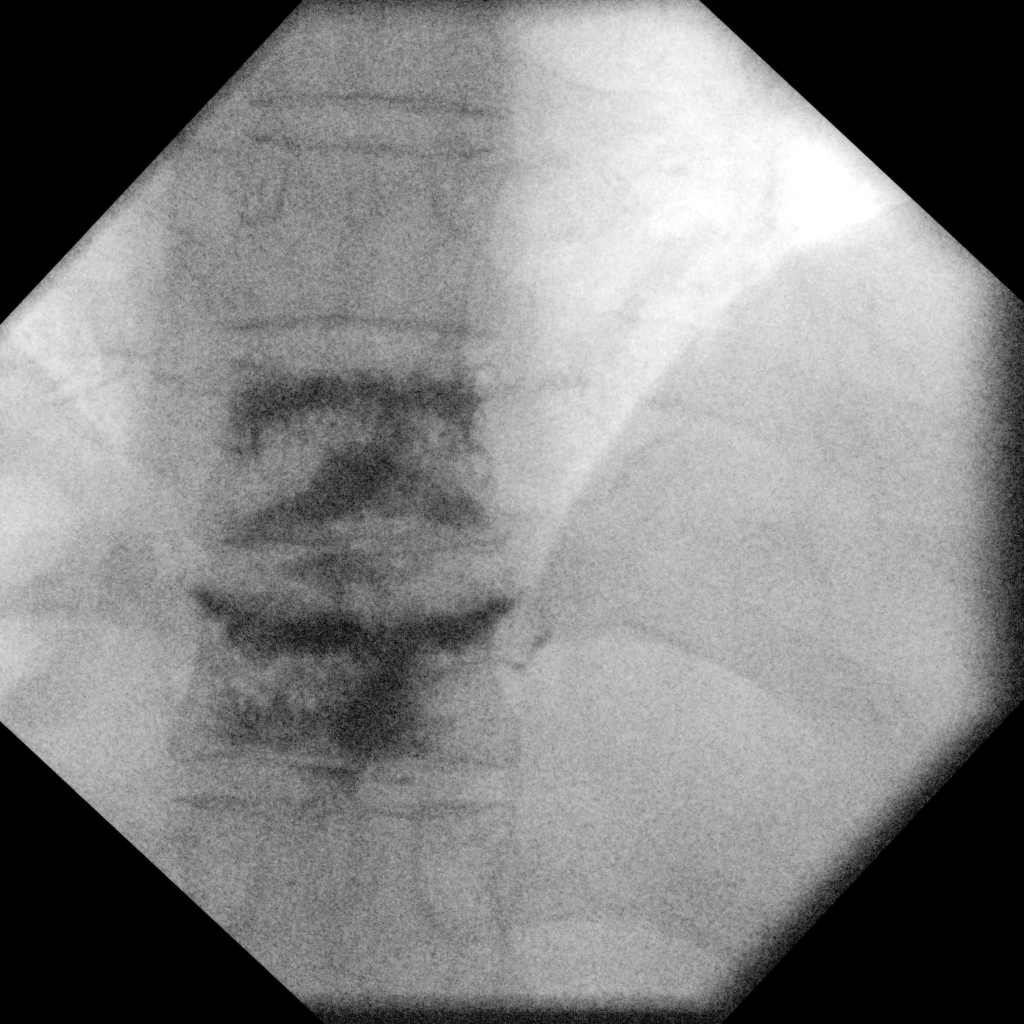
[frame 2/9]
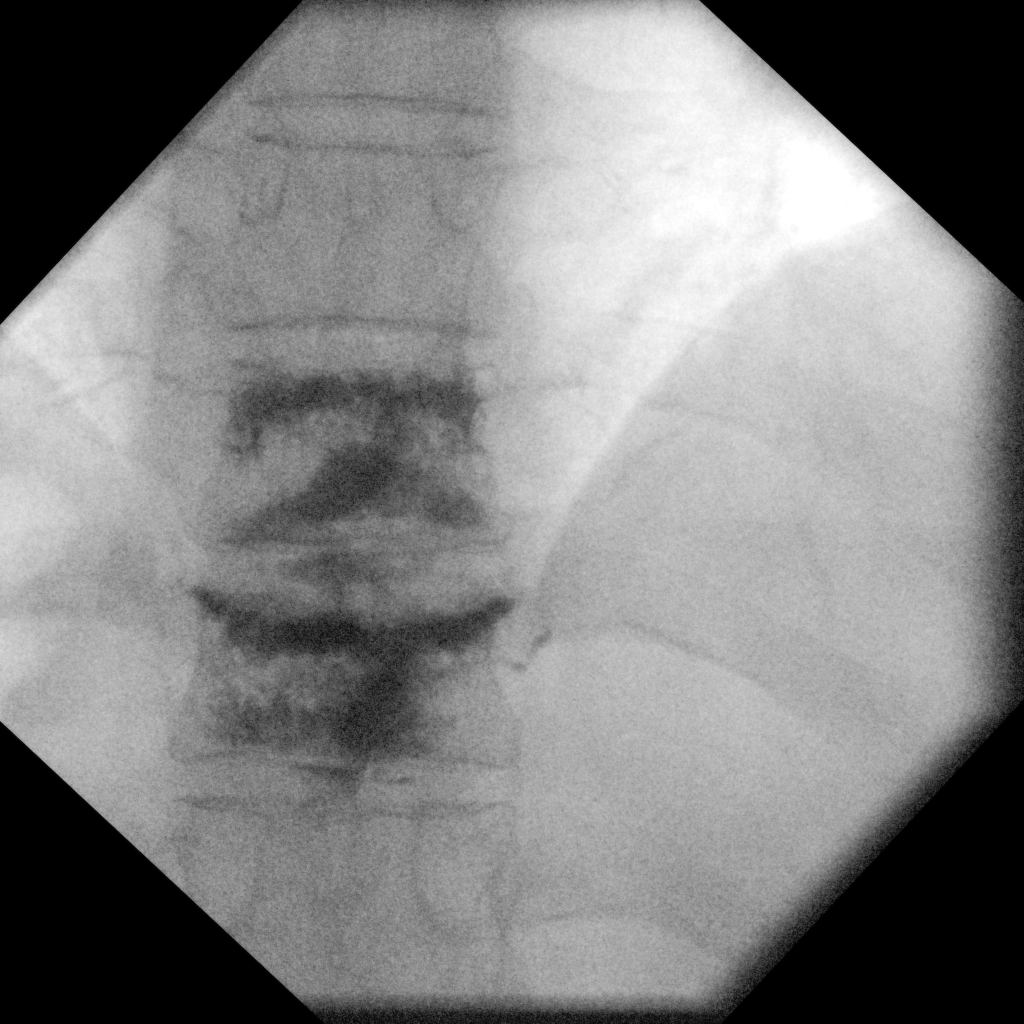
[frame 5/9]
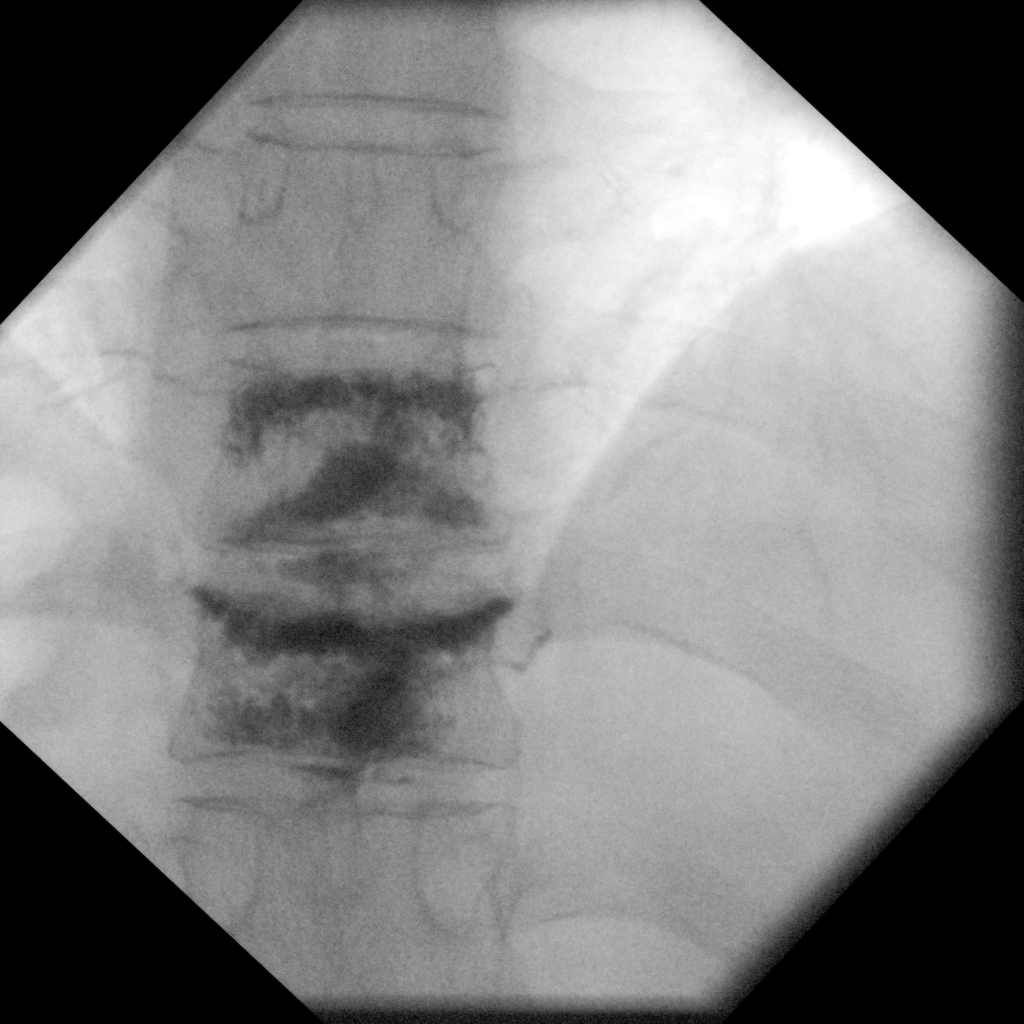
[frame 8/9]
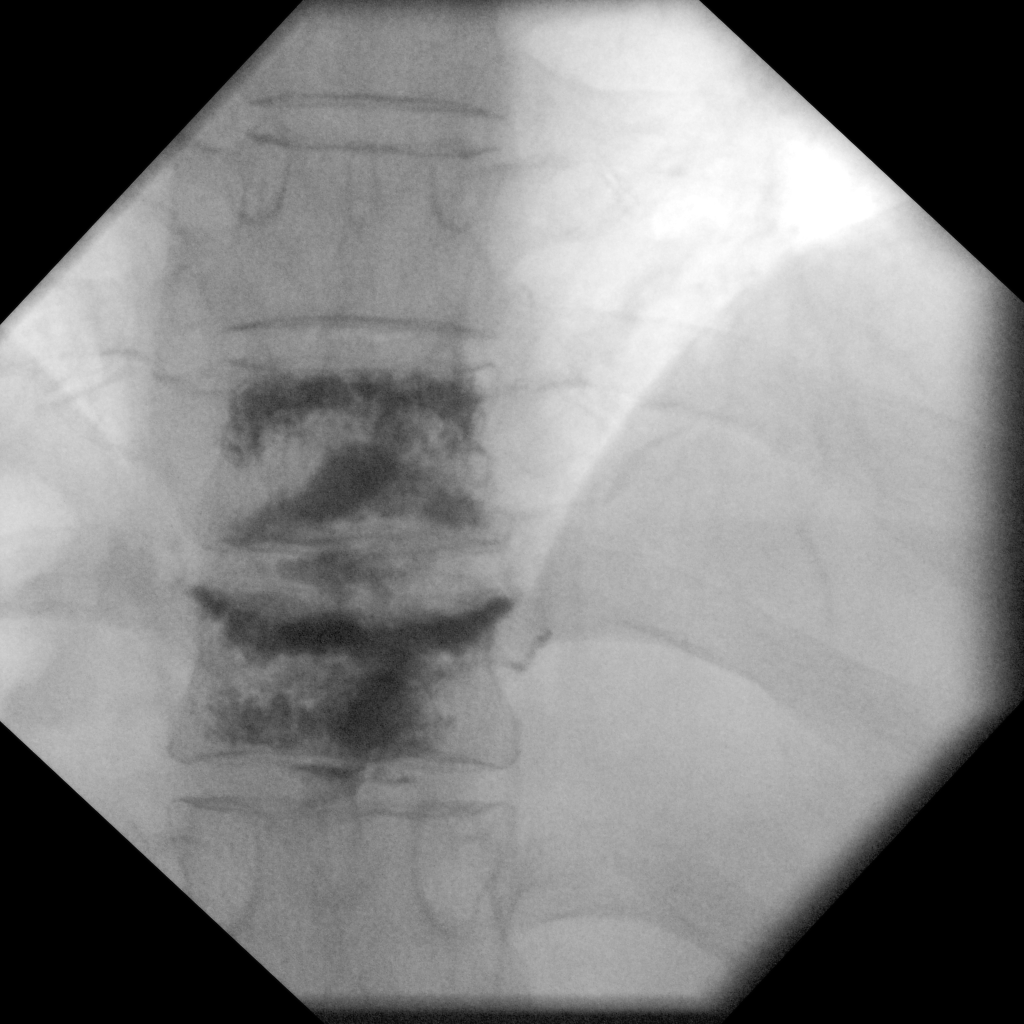

[4 of 4 positions shown; findings below may reference images not displayed]

FINDINGS: Two C-arm fluoroscopic images were obtained intraoperatively and
submitted for post operative interpretation. These images
demonstrate interval T9 kyphoplasty with some extravasation of
cement into the T9-T10 disc space. Prior T10 kyphoplasty. Please see
the performing provider's procedural report for further detail.
IMPRESSION: Intraoperative fluoroscopy, as detailed above.

## 2022-12-11 ENCOUNTER — Other Ambulatory Visit: Payer: Medicare Other

## 2022-12-11 ENCOUNTER — Encounter: Payer: Self-pay | Admitting: Family Medicine

## 2022-12-11 ENCOUNTER — Ambulatory Visit (INDEPENDENT_AMBULATORY_CARE_PROVIDER_SITE_OTHER)
Admission: RE | Admit: 2022-12-11 | Discharge: 2022-12-11 | Disposition: A | Discharge status: Home / Self Care | Payer: Medicare HMO | Source: Ambulatory Visit | Attending: Family Medicine | Admitting: Family Medicine

## 2022-12-11 ENCOUNTER — Ambulatory Visit (INDEPENDENT_AMBULATORY_CARE_PROVIDER_SITE_OTHER): Payer: Medicare HMO | Admitting: Family Medicine

## 2022-12-11 VITALS — BP 140/78 | HR 101 | Temp 97.6°F | Ht 72.0 in | Wt 185.0 lb

## 2022-12-11 DIAGNOSIS — I701 Atherosclerosis of renal artery: Secondary | ICD-10-CM | POA: Diagnosis not present

## 2022-12-11 DIAGNOSIS — J069 Acute upper respiratory infection, unspecified: Secondary | ICD-10-CM

## 2022-12-11 DIAGNOSIS — J439 Emphysema, unspecified: Secondary | ICD-10-CM | POA: Diagnosis not present

## 2022-12-11 DIAGNOSIS — R197 Diarrhea, unspecified: Secondary | ICD-10-CM

## 2022-12-11 DIAGNOSIS — J449 Chronic obstructive pulmonary disease, unspecified: Secondary | ICD-10-CM | POA: Diagnosis not present

## 2022-12-11 DIAGNOSIS — R059 Cough, unspecified: Secondary | ICD-10-CM

## 2022-12-11 DIAGNOSIS — R112 Nausea with vomiting, unspecified: Secondary | ICD-10-CM

## 2022-12-11 DIAGNOSIS — R062 Wheezing: Secondary | ICD-10-CM | POA: Diagnosis not present

## 2022-12-11 LAB — POCT INFLUENZA A/B
Influenza A, POC: NEGATIVE
Influenza B, POC: NEGATIVE

## 2022-12-11 LAB — POC COVID19 BINAXNOW: SARS Coronavirus 2 Ag: NEGATIVE

## 2022-12-11 MED ORDER — PROMETHAZINE HCL 12.5 MG PO TABS: 12.5000 mg | ORAL_TABLET | Freq: Two times a day (BID) | ORAL | 0 refills | Status: DC | PRN

## 2022-12-11 MED ORDER — AZITHROMYCIN 250 MG PO TABS: ORAL_TABLET | ORAL | 0 refills | Status: DC

## 2022-12-11 MED ORDER — HYDROCODONE BIT-HOMATROP MBR 5-1.5 MG/5ML PO SOLN: 5.0000 mL | Freq: Three times a day (TID) | ORAL | 0 refills | Status: DC | PRN

## 2022-12-11 MED ORDER — PREDNISONE 10 MG PO TABS: ORAL_TABLET | ORAL | 0 refills | Status: DC

## 2022-12-11 NOTE — Progress Notes (Signed)
Subjective:    Patient ID: Morgan [name], female    DOB: Jun 07, 1951, 71 y.o.   MRN: [id number]  HPI 71 yo pf of NP Dugal presents for cough/ resp symptoms in setting of copd  Wt Readings from Last 3 Encounters:  12/11/22 185 lb (83.9 kg)  12/04/22 181 lb 6.4 oz (82.3 kg)  10/17/22 175 lb 2 oz (79.4 kg)   25.09 kg/m  Symptoms started Thursday  Woke up with the chills  Then started getting sick - vomiting and diarrhea   Has chronic nausea  Sees GI Has prn phenergan Needs refill   Drinking lots of water / some of it stays down Unable to ear    Cough-is bad/makes her hurt  A little phlegm/ unsure if any color to it   Wheezing (with copd) Wears 02 at home    Short of breath   Headache Back of throat is a little sore Ears ring/ not sore    House mate died of pneumonia a week ago   Has not had flu shot or covid shot this year   Otc- hyquil and day quil  Has taken hycodan for cough in the past  Results for orders placed or performed in visit on 12/11/22  POC COVID-19  Result Value Ref Range   SARS Coronavirus 2 Ag Negative Negative  POCT Influenza A/B  Result Value Ref Range   Influenza A, POC Negative Negative   Influenza B, POC Negative Negative     DG Chest 2 View  Result Date: 12/11/2022 CLINICAL DATA:  Cough and wheezing. EXAM: CHEST - 2 VIEW COMPARISON:  Chest x-ray dated August 08, 2022. FINDINGS: The heart size and mediastinal contours are within normal limits. Normal pulmonary vascularity. The lungs remain hyperinflated with emphysematous changes and coarsened interstitial markings. No focal consolidation, pleural effusion, or pneumothorax. No acute osseous abnormality. Chronic T9 and T10 compression deformity status post cement augmentation again noted. IMPRESSION: 1. No acute cardiopulmonary disease. 2. COPD. Electronically Signed   By: Titus Dubin M.D.   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[place]) 09/21/2022   History of ischemic stroke 09/21/2022   History of colon polyps 09/21/2022   Unintentional weight loss 09/09/2022   Generalized anxiety disorder 09/06/2022   Viral URI with cough 08/15/2022   Syncope and collapse 08/03/2022   Parkinson disease    Hemiplegic migraine, not intractable, 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   Vitamin B12 deficiency 02/20/2022   Vitamin D deficiency 02/20/2022   Vascular dementia, unspecified severity, with anxiety ([place]) 01/10/2022   Tremor 10/27/2021   Stage 3a chronic kidney disease ([place]) 09/11/2021   IDA (iron deficiency anemia) 09/11/2021   Elevated TSH 08/28/2021   Osteopenia 08/28/2021   DOE (dyspnea on exertion) 08/28/2021   Chronic diastolic CHF (congestive heart failure) ([place]) 08/09/2020   Pyloric stenosis in adult    Chronic cholecystitis 05/19/2020   Peptic ulcer disease 03/22/2020   Iron deficiency anemia 03/22/2020   Chronic abdominal pain 02/19/2019   CKD (chronic kidney disease)  stage 3, GFR 30-59 ml/min (HCC) 01/13/2019   Oropharyngeal dysphagia 11/12/2018   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[place]) 05/08/2018   COPD (chronic obstructive pulmonary disease) ([place]) 01/14/2018   Non-rheumatic mitral regurgitation 11/21/2017   Left-sided weakness 10/04/2017   HLD (hyperlipidemia) 10/04/2017   GERD (gastroesophageal reflux disease) 10/04/2017   Hyperlipidemia, unspecified 10/04/2017   Left hemiparesis ([place]) 10/04/2017   Depression 07/22/2016   Essential hypertension    Major depressive disorder, recurrent ([place])     Chronic lower back pain 04/29/2013   Paresthesia 04/29/2013   Past Medical History:  Diagnosis Date   Allergy    Anemia    Anxiety    Arthritis    CHF (congestive heart failure) (HCC)    COPD (chronic obstructive pulmonary disease) (HCC)    Depression    GERD (gastroesophageal reflux disease)    Hypertension    MVP (mitral valve prolapse)    Parkinson disease    Past Surgical History:  Procedure Laterality Date   ABDOMINAL HYSTERECTOMY     APPENDECTOMY     BALLOON DILATION N/A 04/21/2020   Procedure: BALLOON DILATION;  Surgeon: Milus Banister, MD;  Location: [facility];  Service: Endoscopy;  Laterality: N/A;  pyloric/ GI   BALLOON DILATION N/A 07/04/2020   Procedure: BALLOON DILATION;  Surgeon: Mauri Pole, MD;  Location: [facility];  Service: Endoscopy;  Laterality: N/A;   BIOPSY  04/21/2020   Procedure: BIOPSY;  Surgeon: Milus Banister, MD;  Location: [facility];  Service: Endoscopy;;   BIOPSY  07/04/2020   Procedure: BIOPSY;  Surgeon: Mauri Pole, MD;  Location: [facility];  Service: Endoscopy;;  EGD and COLON   BREAST SURGERY Right 1988   tumor removal   CHOLECYSTECTOMY N/A 05/18/2020   Procedure: LAPAROSCOPIC CHOLECYSTECTOMY;  Surgeon: Johnathan Hausen, MD;  Location: [facility];  Service: General;  Laterality: N/A;   COLONOSCOPY WITH PROPOFOL N/A 07/04/2020   Procedure: COLONOSCOPY WITH PROPOFOL;  Surgeon: Mauri Pole, MD;  Location: [facility];  Service: Endoscopy;  Laterality: N/A;   COLONOSCOPY WITH PROPOFOL N/A 11/03/2021   Procedure: COLONOSCOPY WITH PROPOFOL;  Surgeon: Lesly Rubenstein, MD;  Location: [facility];  Service: Endoscopy;  Laterality: N/A;   ESOPHAGEAL DILATION  08/11/2020   Procedure: PYLORIC DILATION;  Surgeon: Doran Stabler, MD;  Location: [facility];  Service: Gastroenterology;;   ESOPHAGOGASTRODUODENOSCOPY (EGD) WITH PROPOFOL N/A 04/21/2020   Procedure: ESOPHAGOGASTRODUODENOSCOPY (EGD) WITH PROPOFOL;  Surgeon:  Milus Banister, MD;  Location: [facility];  Service: Endoscopy;  Laterality: N/A;   ESOPHAGOGASTRODUODENOSCOPY (EGD) WITH PROPOFOL N/A 07/04/2020   Procedure: ESOPHAGOGASTRODUODENOSCOPY (EGD) WITH PROPOFOL;  Surgeon: Mauri Pole, MD;  Location: [facility];  Service: Endoscopy;  Laterality: N/A;   ESOPHAGOGASTRODUODENOSCOPY (EGD) WITH PROPOFOL N/A 08/11/2020   Procedure: ESOPHAGOGASTRODUODENOSCOPY (EGD) WITH PROPOFOL;  Surgeon: Doran Stabler, MD;  Location: [facility];  Service: Gastroenterology;  Laterality: N/A;   ESOPHAGOGASTRODUODENOSCOPY (EGD) WITH PROPOFOL N/A 11/03/2021   Procedure: ESOPHAGOGASTRODUODENOSCOPY (EGD) WITH PROPOFOL;  Surgeon: Lesly Rubenstein, MD;  Location: [facility];  Service: Endoscopy;  Laterality: N/A;   ESOPHAGOGASTRODUODENOSCOPY (EGD) WITH PROPOFOL N/A 05/07/2022   Procedure: ESOPHAGOGASTRODUODENOSCOPY (EGD) WITH PROPOFOL;  Surgeon: Lin Landsman, MD;  Location: [facility];  Service: Gastroenterology;  Laterality: N/A;   KNEE SURGERY  2005   2 times left   KNEE SURGERY Right    KYPHOPLASTY N/A 07/20/2021   Procedure: T 10KYPHOPLASTY;  Surgeon: Hessie Knows, MD;  Location: [facility];  Service: Orthopedics;  Laterality:  N/A;   KYPHOPLASTY N/A 08/23/2021   Procedure: T9 KYPHOPLASTY;  Surgeon: Hessie Knows, MD;  Location: [facility];  Service: Orthopedics;  Laterality: N/A;   POLYPECTOMY  07/04/2020   Procedure: POLYPECTOMY;  Surgeon: Mauri Pole, MD;  Location: [facility];  Service: Endoscopy;;   TUBAL LIGATION     Social History   Tobacco Use   Smoking status: Former    Packs/day: 1.00    Years: 15.00    Total pack years: 15.00    Types: Cigarettes    Quit date: 09/12/2017    Years since quitting: 5.2   Smokeless tobacco: Never  Vaping Use   Vaping Use: Never used  Substance Use Topics   Alcohol use: Not Currently   Drug use: No   Family History  Problem Relation Age of Onset   Breast cancer Mother 39   Ovarian  cancer Mother 22   Brain cancer Mother    Hypertension Father    Hyperlipidemia Father    Heart disease Father    Colon cancer Father        diagnosed in his 73s   Breast cancer Maternal Grandmother 31   Diabetes Paternal Grandmother    Breast cancer Paternal Grandmother 75   Other Son        drug over dose   Colon cancer Maternal Grandfather        dx in his 18s   Liver disease Maternal Grandfather    Breast cancer Maternal Aunt 60   Breast cancer Maternal Aunt 60   Colon cancer Maternal Uncle    Stomach cancer Neg Hx    Pancreatic cancer Neg Hx    Esophageal cancer Neg Hx    Allergies  Allergen Reactions   Meperidine Hives and Anaphylaxis   Strawberry Extract Hives and Swelling   Sucralfate Nausea Only and Other (See Comments)    Severe nausea Other reaction(s): Other (See Comments) Severe nausea Severe nausea   Wasp Venom Hives   Current Outpatient Medications on File Prior to Visit  Medication Sig Dispense Refill   acetaminophen (TYLENOL) 325 MG tablet Take 2 tablets (650 mg total) by mouth every 4 (four) hours as needed for mild pain (or temp > 37.5 C (99.5 F)).     carbidopa-levodopa (SINEMET IR) 25-100 MG tablet Take 2 tablets by mouth 3 (three) times daily.     cyanocobalamin (VITAMIN B12) 1000 MCG/ML injection Inject 1,000 mcg into the muscle once a week.     escitalopram (LEXAPRO) 5 MG tablet Take 1 tablet (5 mg total) by mouth daily. 90 tablet 3   hydrOXYzine (ATARAX) 25 MG tablet TAKE 1 TABLET (25 MG TOTAL) BY MOUTH 2 (TWO) TIMES DAILY AS NEEDED FOR ANXIETY OR NAUSEA. 180 tablet 1   ibuprofen (ADVIL) 400 MG tablet Take 1 tablet (400 mg total) by mouth every 8 (eight) hours as needed for headache or moderate pain. 20 tablet 0   ondansetron (ZOFRAN-ODT) 4 MG disintegrating tablet Take 1 tablet (4 mg total) by mouth every 8 (eight) hours as needed for nausea or vomiting. 20 tablet 0   OXYGEN Inhale 3 L/min into the lungs See admin instructions. 3 L/min at bedtime  and as needed for shortness of breath throughout the day     potassium chloride SA (KLOR-CON M) 20 MEQ tablet Take 1 tablet (20 mEq total) by mouth daily. 30 tablet 3   topiramate (TOPAMAX) 50 MG tablet Take 1 tablet (50 mg total) by mouth at bedtime. 90 tablet  0   Vitamin D, Ergocalciferol, (DRISDOL) 1.25 MG (50000 UNIT) CAPS capsule Take 50,000 Units by mouth once a week.     pantoprazole (PROTONIX) 40 MG tablet Take 1 tablet (40 mg total) by mouth 2 (two) times daily for 14 days. 28 tablet 0   No current facility-administered medications on file prior to visit.    Review of Systems  Constitutional:  Positive for appetite change and fatigue. Negative for fever.  HENT:  Positive for congestion, postnasal drip, rhinorrhea, sinus pressure, sneezing and sore throat. Negative for ear pain.   Eyes:  Negative for pain and discharge.  Respiratory:  Positive for cough, shortness of breath and wheezing. Negative for stridor.   Cardiovascular:  Negative for chest pain.  Gastrointestinal:  Negative for diarrhea, nausea and vomiting.  Genitourinary:  Negative for frequency, hematuria and urgency.  Musculoskeletal:  Negative for arthralgias and myalgias.  Skin:  Negative for rash.  Neurological:  Positive for headaches. Negative for dizziness, weakness and light-headedness.  Psychiatric/Behavioral:  Negative for confusion and dysphoric mood.        Objective:   Physical Exam Constitutional:      General: She is not in acute distress.    Appearance: Normal appearance. She is well-developed and normal weight. She is not ill-appearing, toxic-appearing or diaphoretic.  HENT:     Head: Normocephalic and atraumatic.     Comments: Nares are injected and congested      Right Ear: Tympanic membrane, ear canal and external ear normal.     Left Ear: Tympanic membrane, ear canal and external ear normal.     Nose: Congestion and rhinorrhea present.     Mouth/Throat:     Mouth: Mucous membranes are moist.      Pharynx: Oropharynx is clear. No oropharyngeal exudate or posterior oropharyngeal erythema.     Comments: Clear pnd  Eyes:     General:        Right eye: No discharge.        Left eye: No discharge.     Conjunctiva/sclera: Conjunctivae normal.     Pupils: Pupils are equal, round, and reactive to light.  Cardiovascular:     Rate and Rhythm: Tachycardia present.     Heart sounds: Normal heart sounds.  Pulmonary:     Effort: Pulmonary effort is normal. No respiratory distress.     Breath sounds: No stridor. Wheezing and rhonchi present. No rales.     Comments: Few scattered rhonchi  Wheezing is expiratory and worse on forced exp No prolonged exp phase No crackles   Some chest wall tenderness  Cough is dry sounding and frequent  Chest:     Chest wall: No tenderness.  Musculoskeletal:     Cervical back: Normal range of motion and neck supple.  Lymphadenopathy:     Cervical: No cervical adenopathy.  Skin:    General: Skin is warm and dry.     Capillary Refill: Capillary refill takes less than 2 seconds.     Findings: No rash.  Neurological:     Mental Status: She is alert.     Cranial Nerves: No cranial nerve deficit.  Psychiatric:        Mood and Affect: Mood normal.           Assessment & Plan:   Problem List Items Addressed This Visit       Respiratory   Viral URI with cough - Primary    With exacerbation of copd and shortness of breath  Recent exp to pna (which her house mate died from)  Covid and flu testing is neg today and pulse ox 96% on RA (she wears 02 at home)   Cxr ordered stat-no infiltrate Zpak and pred 40 mg taper px Will get back on 02 at home  Albuterol mdi is available if needed  Fluids/rest /sympt care  ER precautions discussed in detail  Update if not starting to improve in a week or if worsening        Relevant Medications   azithromycin (ZITHROMAX Z-PAK) 250 MG tablet   Other Relevant Orders   DG Chest 2 View (Completed)      Digestive   Nausea vomiting and diarrhea    Per pt chronic and sees GI  Refilled phenergan today  Well hydrated on exam      Relevant Medications   promethazine (PHENERGAN) 12.5 MG tablet   Other Visit Diagnoses     Cough, unspecified type       Relevant Orders   POC COVID-19 (Completed)   POCT Influenza A/B (Completed)

## 2022-12-11 NOTE — Patient Instructions (Signed)
Chest xray now  We will call you with a result   Take the prednisone and the zpak as directed Try the hycodan for cough - caution of sedation  Use your inhaler as needed  Get back on oxygen  I sent in phenergan   Update if not starting to improve in a week or if worsening

## 2022-12-11 NOTE — Assessment & Plan Note (Addendum)
With exacerbation of copd and shortness of breath  Recent exp to pna (which her house mate died from)  Covid and flu testing is neg today and pulse ox 96% on RA (she wears 02 at home)   Cxr ordered stat-no infiltrate Zpak and pred 40 mg taper px Will get back on 02 at home  Albuterol mdi is available if needed  Fluids/rest /sympt care  ER precautions discussed in detail  Update if not starting to improve in a week or if worsening

## 2022-12-11 NOTE — Assessment & Plan Note (Signed)
Per pt chronic and sees GI  Refilled phenergan today  Well hydrated on exam

## 2022-12-12 ENCOUNTER — Encounter: Payer: Self-pay | Admitting: Oncology

## 2022-12-13 ENCOUNTER — Ambulatory Visit: Payer: Medicare Other

## 2022-12-13 ENCOUNTER — Ambulatory Visit: Payer: Medicare Other | Admitting: Oncology

## 2022-12-13 DIAGNOSIS — K909 Intestinal malabsorption, unspecified: Secondary | ICD-10-CM | POA: Diagnosis not present

## 2022-12-13 DIAGNOSIS — R197 Diarrhea, unspecified: Secondary | ICD-10-CM | POA: Diagnosis not present

## 2022-12-13 DIAGNOSIS — A048 Other specified bacterial intestinal infections: Secondary | ICD-10-CM | POA: Diagnosis not present

## 2022-12-14 ENCOUNTER — Telehealth: Payer: Self-pay | Admitting: Family

## 2022-12-14 MED ORDER — BENZONATATE 200 MG PO CAPS: 200.0000 mg | ORAL_CAPSULE | Freq: Three times a day (TID) | ORAL | 1 refills | Status: DC | PRN

## 2022-12-14 NOTE — Telephone Encounter (Signed)
Pt notified Rx sent to pharmacy and advised of Dr. Marliss Coots comments and verbalized understanding

## 2022-12-14 NOTE — Addendum Note (Signed)
Addended by: Loura Pardon A on: 12/14/2022 01:36 PM   Modules accepted: Orders

## 2022-12-14 NOTE — Telephone Encounter (Signed)
Patient called in stating she seen Dr. Glori Bickers and was prescribed HYDROcodone bit-homatropine (HYCODAN) 5-1.5 MG/5ML syrup. She stated the mediation is on back order and was wondering if there is something else could be sent in for her. Please advise. Thank you!

## 2022-12-14 NOTE — Telephone Encounter (Signed)
I sent tessalon Many things are hard to get right now Delsym otc is helpful also

## 2022-12-15 IMAGING — DX DG CHEST 2V
2 series · 2 of 2 positions shown · non-contrast
Comparison: 06/12/2021

CLINICAL DATA: Shortness of breath and chest pain following recent
fall, initial encounter

EXAM:
CHEST - 2 VIEW

[chest pa]
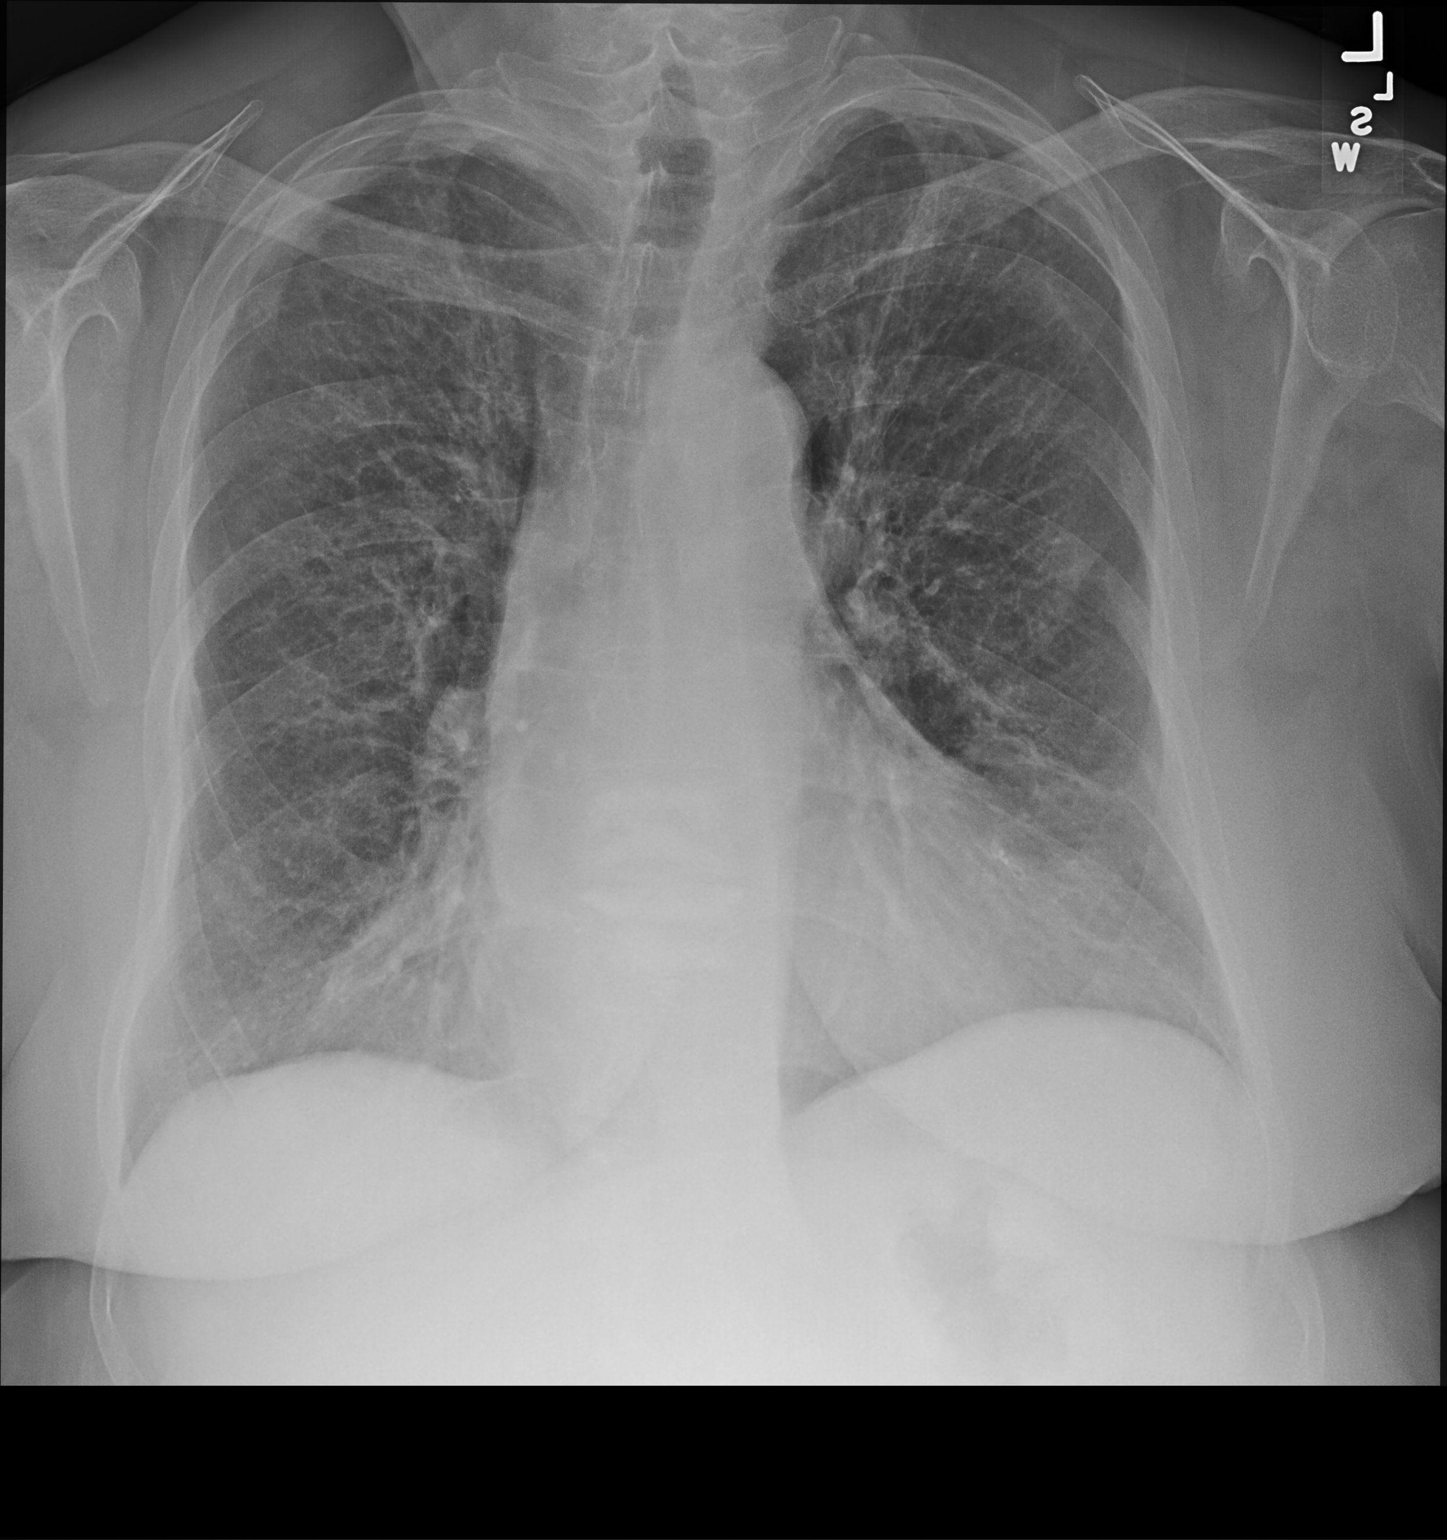

[chest lat]
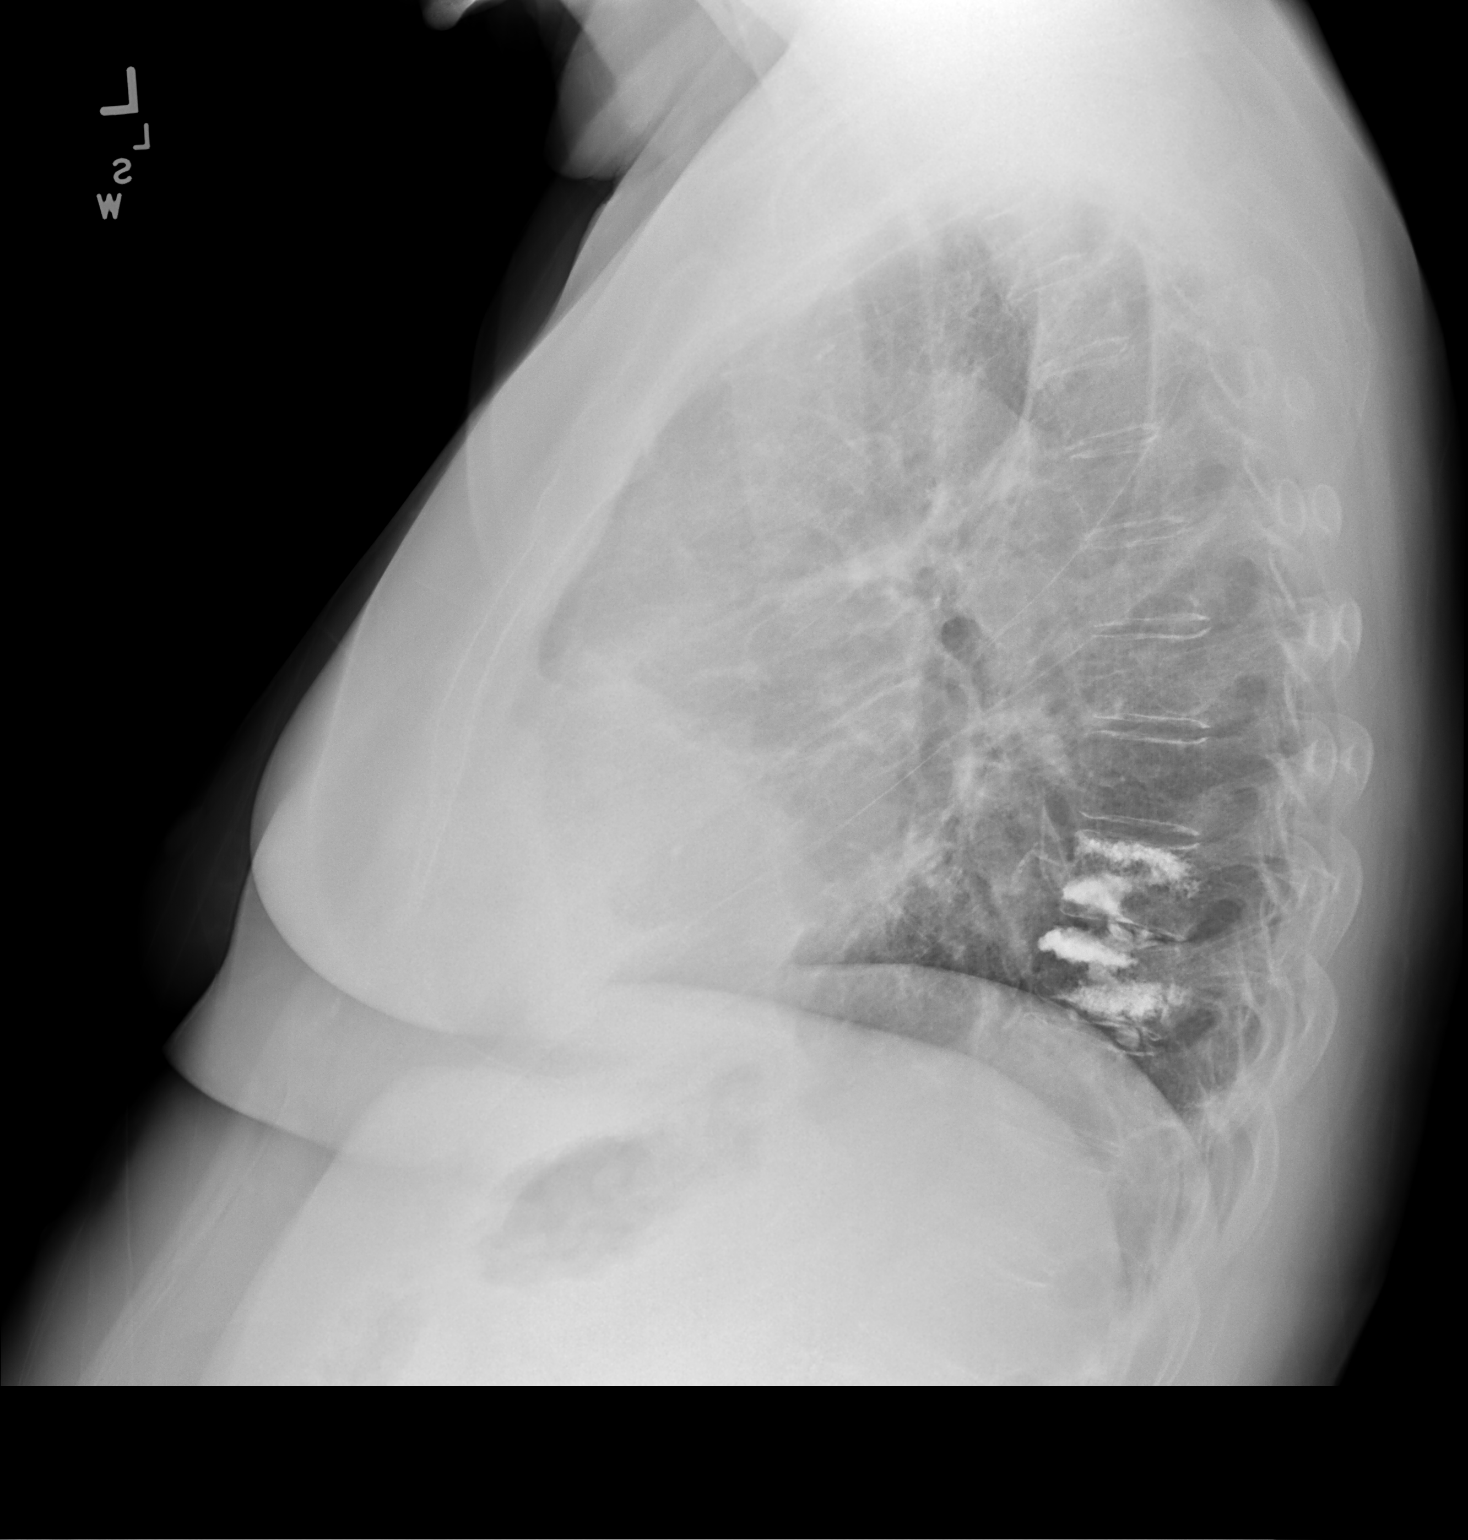

[2 of 2 positions shown; findings below may reference images not displayed]

FINDINGS: Cardiac shadow is stable. Mild aortic calcifications are noted. Mild
interstitial changes are noted bilaterally stable from the prior
exam. Previously seen peripheral infiltrate on the right has
resolved in the interval. No definitive rib deformities are
identified. Changes of prior vertebral augmentation are seen new
from the prior study. Healed sternal fracture is again identified
stable from prior CT examination.
IMPRESSION: Chronic interstitial changes.

No acute bony abnormality is noted.

## 2022-12-21 ENCOUNTER — Other Ambulatory Visit: Payer: Self-pay

## 2022-12-21 DIAGNOSIS — M7918 Myalgia, other site: Secondary | ICD-10-CM | POA: Diagnosis not present

## 2022-12-21 DIAGNOSIS — M5481 Occipital neuralgia: Secondary | ICD-10-CM | POA: Diagnosis not present

## 2022-12-21 MED ORDER — POTASSIUM CHLORIDE CRYS ER 20 MEQ PO TBCR: 20.0000 meq | EXTENDED_RELEASE_TABLET | Freq: Every day | ORAL | 1 refills | Status: DC

## 2022-12-24 ENCOUNTER — Inpatient Hospital Stay: Payer: Medicare HMO | Attending: Oncology

## 2022-12-24 DIAGNOSIS — Z87891 Personal history of nicotine dependence: Secondary | ICD-10-CM | POA: Diagnosis not present

## 2022-12-24 DIAGNOSIS — N183 Chronic kidney disease, stage 3 unspecified: Secondary | ICD-10-CM | POA: Insufficient documentation

## 2022-12-24 DIAGNOSIS — I509 Heart failure, unspecified: Secondary | ICD-10-CM | POA: Insufficient documentation

## 2022-12-24 DIAGNOSIS — D509 Iron deficiency anemia, unspecified: Secondary | ICD-10-CM | POA: Diagnosis not present

## 2022-12-24 DIAGNOSIS — I130 Hypertensive heart and chronic kidney disease with heart failure and stage 1 through stage 4 chronic kidney disease, or unspecified chronic kidney disease: Secondary | ICD-10-CM | POA: Diagnosis not present

## 2022-12-24 DIAGNOSIS — N1831 Chronic kidney disease, stage 3a: Secondary | ICD-10-CM

## 2022-12-24 LAB — CBC WITH DIFFERENTIAL/PLATELET
Abs Immature Granulocytes: 0.08 10*3/uL — ABNORMAL HIGH (ref 0.00–0.07)
Basophils Absolute: 0 10*3/uL (ref 0.0–0.1)
Basophils Relative: 0 %
Eosinophils Absolute: 0.1 10*3/uL (ref 0.0–0.5)
Eosinophils Relative: 1 %
HCT: 31.3 % — ABNORMAL LOW (ref 36.0–46.0)
Hemoglobin: 10.2 g/dL — ABNORMAL LOW (ref 12.0–15.0)
Immature Granulocytes: 1 %
Lymphocytes Relative: 21 %
Lymphs Abs: 1.7 10*3/uL (ref 0.7–4.0)
MCH: 28.2 pg (ref 26.0–34.0)
MCHC: 32.6 g/dL (ref 30.0–36.0)
MCV: 86.5 fL (ref 80.0–100.0)
Monocytes Absolute: 0.3 10*3/uL (ref 0.1–1.0)
Monocytes Relative: 4 %
Neutro Abs: 5.9 10*3/uL (ref 1.7–7.7)
Neutrophils Relative %: 73 %
Platelets: 332 10*3/uL (ref 150–400)
RBC: 3.62 MIL/uL — ABNORMAL LOW (ref 3.87–5.11)
RDW: 14.4 % (ref 11.5–15.5)
WBC: 8.1 10*3/uL (ref 4.0–10.5)
nRBC: 0 % (ref 0.0–0.2)

## 2022-12-24 LAB — RETIC PANEL
Immature Retic Fract: 9.8 % (ref 2.3–15.9)
RBC.: 3.5 MIL/uL — ABNORMAL LOW (ref 3.87–5.11)
Retic Count, Absolute: 60.5 10*3/uL (ref 19.0–186.0)
Retic Ct Pct: 1.7 % (ref 0.4–3.1)
Reticulocyte Hemoglobin: 25.7 pg — ABNORMAL LOW (ref >27.9)

## 2022-12-24 LAB — FERRITIN: Ferritin: 114 ng/mL (ref 11–307)

## 2022-12-24 LAB — IRON AND TIBC
Iron: 78 ug/dL (ref 28–170)
Saturation Ratios: 30 % (ref 10.4–31.8)
TIBC: 259 ug/dL (ref 250–450)
UIBC: 181 ug/dL

## 2022-12-26 ENCOUNTER — Inpatient Hospital Stay: Payer: Medicare HMO | Admitting: Oncology

## 2022-12-26 ENCOUNTER — Encounter: Payer: Self-pay | Admitting: Oncology

## 2022-12-26 ENCOUNTER — Inpatient Hospital Stay: Payer: Medicare HMO

## 2022-12-26 VITALS — BP 163/80 | HR 74 | Temp 99.2°F | Resp 18 | Wt 188.4 lb

## 2022-12-26 VITALS — BP 159/90 | HR 72

## 2022-12-26 DIAGNOSIS — D508 Other iron deficiency anemias: Secondary | ICD-10-CM

## 2022-12-26 DIAGNOSIS — N1831 Chronic kidney disease, stage 3a: Secondary | ICD-10-CM | POA: Diagnosis not present

## 2022-12-26 DIAGNOSIS — Z87891 Personal history of nicotine dependence: Secondary | ICD-10-CM | POA: Diagnosis not present

## 2022-12-26 DIAGNOSIS — N183 Chronic kidney disease, stage 3 unspecified: Secondary | ICD-10-CM | POA: Diagnosis not present

## 2022-12-26 DIAGNOSIS — I509 Heart failure, unspecified: Secondary | ICD-10-CM | POA: Diagnosis not present

## 2022-12-26 DIAGNOSIS — D509 Iron deficiency anemia, unspecified: Secondary | ICD-10-CM | POA: Diagnosis not present

## 2022-12-26 DIAGNOSIS — I130 Hypertensive heart and chronic kidney disease with heart failure and stage 1 through stage 4 chronic kidney disease, or unspecified chronic kidney disease: Secondary | ICD-10-CM | POA: Diagnosis not present

## 2022-12-26 MED ORDER — SODIUM CHLORIDE 0.9 % IV SOLN
Freq: Once | INTRAVENOUS | Status: AC
  Filled 2022-12-26: qty 250

## 2022-12-26 MED ORDER — SODIUM CHLORIDE 0.9 % IV SOLN
200.0000 mg | Freq: Once | INTRAVENOUS | Status: AC
  Administered 2022-12-26: 200 mg via INTRAVENOUS
  Filled 2022-12-26: qty 200

## 2022-12-26 NOTE — Assessment & Plan Note (Signed)
Encourage oral hydration and avoid nephrotoxins.  Creatinine has improve.

## 2022-12-26 NOTE — Progress Notes (Signed)
Pt here for follow up. No new concerns voiced.   

## 2022-12-26 NOTE — Progress Notes (Signed)
Hematology/Oncology Progress note [phone] [phone]      Patient Care Team: Eugenia Pancoast, [place] as PCP - General (Family Medicine) Charlton Haws, [facility] as Pharmacist (Pharmacist) [name], MD as Consulting Physician (Hematology and Oncology) Vladimir Crofts, MD as Consulting Physician (Neurology)  CHIEF COMPLAINTS/REASON FOR VISIT:  Follow-up for iron deficiency anemia, CKD.  ASSESSMENT & PLAN:   IDA (iron deficiency anemia) Iron deficiency anemia in the context of chronic kidney disease.  Labs are reviewed and discussed with patient. I recommend Venofer weekly x 2 to further  improve Hb Hb >10, no need for erythropoitein therapy.     CKD (chronic kidney disease) stage 3, GFR 30-59 ml/min (HCC) Encourage oral hydration and avoid nephrotoxins.  Creatinine has improve.    Orders Placed This Encounter  Procedures   CBC with Differential/Platelet    Standing Status:   Future    Standing Expiration Date:   12/26/2023   Comprehensive metabolic panel    Standing Status:   Future    Standing Expiration Date:   12/26/2023   Iron and TIBC([facility])    Standing Status:   Future    Standing Expiration Date:   12/27/2023   Ferritin    Standing Status:   Future    Standing Expiration Date:   12/27/2023   Retic Panel    Standing Status:   Future    Standing Expiration Date:   12/27/2023   Follow up in 6 months All questions were answered. The patient knows to call the clinic with any problems, questions or concerns.  [name], MD, PhD [facility] Hematology Oncology 12/26/2022   HISTORY OF PRESENTING ILLNESS:   Morgan [name] is a  72 y.o.  female with PMH listed below was seen in consultation at the request of  [name], MD  for evaluation of anemia Patient had blood work done recently on 08/29/2021. CBC showed a hemoglobin of 9.9, MCV 75.5. Patient denies any black stool or blood in the stool. She has a history of gastric stenosis for  which she got EGD dilation periodically.  Last EGD was 08/11/2020. Patient reports feeling tired and fatigued. She has taken oral iron supplementation before.  Iron has made her constipation worse.  INTERVAL HISTORY Morgan [name] is a 72 y.o. female who has above history reviewed by me today presents for follow up visit for management of iron deficiency anemia and CKD.  Patient feels well at baseline. No new complaints. She self administrate B12 injeciton weekly.    Review of Systems  Constitutional:  Positive for fatigue. Negative for appetite change, chills and fever.  HENT:   Negative for hearing loss and voice change.   Eyes:  Negative for eye problems.  Respiratory:  Negative for chest tightness and cough.   Cardiovascular:  Negative for chest pain.  Gastrointestinal:  Negative for abdominal distention, abdominal pain and blood in stool.  Endocrine: Negative for hot flashes.  Genitourinary:  Negative for difficulty urinating and frequency.   Musculoskeletal:  Negative for arthralgias.  Skin:  Negative for itching and rash.  Neurological:  Negative for extremity weakness.  Hematological:  Negative for adenopathy.  Psychiatric/Behavioral:  Negative for confusion.     MEDICAL HISTORY:  Past Medical History:  Diagnosis Date   Allergy    Anemia    Anxiety    Arthritis    CHF (congestive heart failure) (HCC)    COPD (chronic obstructive pulmonary disease) ([place])    Depression  GERD (gastroesophageal reflux disease)    Hypertension    MVP (mitral valve prolapse)    Parkinson disease     SURGICAL HISTORY: Past Surgical History:  Procedure Laterality Date   ABDOMINAL HYSTERECTOMY     APPENDECTOMY     BALLOON DILATION N/A 04/21/2020   Procedure: BALLOON DILATION;  Surgeon: Milus Banister, MD;  Location: [facility];  Service: Endoscopy;  Laterality: N/A;  pyloric/ GI   BALLOON DILATION N/A 07/04/2020   Procedure: BALLOON DILATION;  Surgeon: Mauri Pole, MD;   Location: [facility];  Service: Endoscopy;  Laterality: N/A;   BIOPSY  04/21/2020   Procedure: BIOPSY;  Surgeon: Milus Banister, MD;  Location: [facility];  Service: Endoscopy;;   BIOPSY  07/04/2020   Procedure: BIOPSY;  Surgeon: Mauri Pole, MD;  Location: [facility];  Service: Endoscopy;;  EGD and COLON   BREAST SURGERY Right 1988   tumor removal   CHOLECYSTECTOMY N/A 05/18/2020   Procedure: LAPAROSCOPIC CHOLECYSTECTOMY;  Surgeon: Johnathan Hausen, MD;  Location: [facility];  Service: General;  Laterality: N/A;   COLONOSCOPY WITH PROPOFOL N/A 07/04/2020   Procedure: COLONOSCOPY WITH PROPOFOL;  Surgeon: Mauri Pole, MD;  Location: [facility];  Service: Endoscopy;  Laterality: N/A;   COLONOSCOPY WITH PROPOFOL N/A 11/03/2021   Procedure: COLONOSCOPY WITH PROPOFOL;  Surgeon: Lesly Rubenstein, MD;  Location: [facility];  Service: Endoscopy;  Laterality: N/A;   ESOPHAGEAL DILATION  08/11/2020   Procedure: PYLORIC DILATION;  Surgeon: Doran Stabler, MD;  Location: [facility];  Service: Gastroenterology;;   ESOPHAGOGASTRODUODENOSCOPY (EGD) WITH PROPOFOL N/A 04/21/2020   Procedure: ESOPHAGOGASTRODUODENOSCOPY (EGD) WITH PROPOFOL;  Surgeon: Milus Banister, MD;  [facility];  Service: Endoscopy;  Laterality: N/A;   ESOPHAGOGASTRODUODENOSCOPY (EGD) WITH PROPOFOL N/A 07/04/2020   Procedure: ESOPHAGOGASTRODUODENOSCOPY (EGD) WITH PROPOFOL;  Surgeon: Mauri Pole, MD;  [facility];  Service: Endoscopy;  Laterality: N/A;   ESOPHAGOGASTRODUODENOSCOPY (EGD) WITH PROPOFOL N/A 08/11/2020   Procedure: ESOPHAGOGASTRODUODENOSCOPY (EGD) WITH PROPOFOL;  Surgeon: Doran Stabler, MD;  [facility];  Service: Gastroenterology;  Laterality: N/A;   ESOPHAGOGASTRODUODENOSCOPY (EGD) WITH PROPOFOL N/A 11/03/2021   Procedure: ESOPHAGOGASTRODUODENOSCOPY (EGD) WITH PROPOFOL;  Surgeon: Lesly Rubenstein, MD;  Location: [facility];  Service: Endoscopy;   Laterality: N/A;   ESOPHAGOGASTRODUODENOSCOPY (EGD) WITH PROPOFOL N/A 05/07/2022   Procedure: ESOPHAGOGASTRODUODENOSCOPY (EGD) WITH PROPOFOL;  Surgeon: Lin Landsman, MD;  [facility];  Service: Gastroenterology;  Laterality: N/A;   KNEE SURGERY  2005   2 times left   KNEE SURGERY Right    KYPHOPLASTY N/A 07/20/2021   Procedure: T 10KYPHOPLASTY;  Surgeon: Hessie Knows, MD;  Location: [facility];  Service: Orthopedics;  Laterality: N/A;   KYPHOPLASTY N/A 08/23/2021   Procedure: T9 KYPHOPLASTY;  Surgeon: Hessie Knows, MD;  Location: [facility];  Service: Orthopedics;  Laterality: N/A;   POLYPECTOMY  07/04/2020   Procedure: POLYPECTOMY;  Surgeon: Mauri Pole, MD;  [facility]: [facility];  Service: Endoscopy;;   TUBAL LIGATION      SOCIAL HISTORY: Social History   Socioeconomic History   Marital status: Single    Spouse name: Not on file   Number of children: 2   Years of education: Not on file   Highest education level: Not on file  Occupational History   Not on file  Tobacco Use   Smoking status: Former    Packs/day: 1.00    Years: 15.00    Total pack years: 15.00  Types: Cigarettes    Quit date: 09/12/2017    Years since quitting: 5.2   Smokeless tobacco: Never  Vaping Use   Vaping Use: Never used  Substance and Sexual Activity   Alcohol use: Not Currently   Drug use: No   Sexual activity: Not Currently  Other Topics Concern   Not on file  Social History Narrative   Lives with Grandson's great grandparents    [name] - 34 years old ([place])   Daughter - [name] - lives in the [place] system - family, aunt, [name] and [name] (who she lives with), brother   Transportation: roommates   Enjoy: [employer] channel, playing with grandson   Exercise: PT exercises   Diet: good - chicken, some steak, tries to eat healthy, avoids fried foods   Retired from being a Quarry manager   Social Determinants of Radio broadcast assistant Strain: [place]   (10/11/2022)   Overall Financial Resource Strain (CARDIA)    Difficulty of Paying Living Expenses: Not hard at all  Food Insecurity: No Food Insecurity (10/11/2022)   Hunger Vital Sign    Worried About Running Out of Food in the Last Year: Never true    [place] in the Last Year: Never true  Transportation Needs: No Transportation Needs (10/11/2022)   PRAPARE - Hydrologist (Medical): No    Lack of Transportation (Non-Medical): No  Physical Activity: Inactive (10/11/2022)   Exercise Vital Sign    Days of Exercise per Week: 0 days    Minutes of Exercise per Session: 0 min  Stress: No Stress Concern Present (10/11/2022)   [place]    Feeling of Stress : Not at all  Social Connections: Moderately Integrated (10/11/2022)   Social Connection and Isolation Panel [NHANES]    Frequency of Communication with Friends and Family: More than three times a week    Frequency of Social Gatherings with Friends and Family: More than three times a week    Attends Religious Services: More than 4 times per year    Active Member of [employer] or Organizations: Yes    Attends Music therapist: More than 4 times per year    Marital Status: Divorced  Intimate Partner Violence: Not At Risk (10/11/2022)   Humiliation, Afraid, Rape, and Kick questionnaire    Fear of Current or Ex-Partner: No    Emotionally Abused: No    Physically Abused: No    Sexually Abused: No    FAMILY HISTORY: Family History  Problem Relation Age of Onset   Breast cancer Mother 55   Ovarian cancer Mother 67   Brain cancer Mother    Hypertension Father    Hyperlipidemia Father    Heart disease Father    Colon cancer Father        diagnosed in his 39s   Breast cancer Maternal Grandmother 94   Diabetes Paternal Grandmother    Breast cancer Paternal Grandmother 50   Other Son        drug over dose   Colon cancer  Maternal Grandfather        dx in his 3s   Liver disease Maternal Grandfather    Breast cancer Maternal Aunt 60   Breast cancer Maternal Aunt 60   Colon cancer Maternal Uncle    Stomach cancer Neg Hx    Pancreatic cancer Neg Hx    Esophageal cancer Neg Hx  ALLERGIES:  is allergic to meperidine, strawberry extract, sucralfate, and wasp venom.  MEDICATIONS:  Current Outpatient Medications  Medication Sig Dispense Refill   acetaminophen (TYLENOL) 325 MG tablet Take 2 tablets (650 mg total) by mouth every 4 (four) hours as needed for mild pain (or temp > 37.5 C (99.5 F)).     carbidopa-levodopa (SINEMET IR) 25-100 MG tablet Take 2 tablets by mouth 3 (three) times daily.     cyanocobalamin (VITAMIN B12) 1000 MCG/ML injection Inject 1,000 mcg into the muscle once a week.     escitalopram (LEXAPRO) 5 MG tablet Take 1 tablet (5 mg total) by mouth daily. 90 tablet 3   hydrOXYzine (ATARAX) 25 MG tablet TAKE 1 TABLET (25 MG TOTAL) BY MOUTH 2 (TWO) TIMES DAILY AS NEEDED FOR ANXIETY OR NAUSEA. 180 tablet 1   ibuprofen (ADVIL) 400 MG tablet Take 1 tablet (400 mg total) by mouth every 8 (eight) hours as needed for headache or moderate pain. 20 tablet 0   ondansetron (ZOFRAN-ODT) 4 MG disintegrating tablet Take 1 tablet (4 mg total) by mouth every 8 (eight) hours as needed for nausea or vomiting. 20 tablet 0   OXYGEN Inhale 3 L/min into the lungs See admin instructions. 3 L/min at bedtime and as needed for shortness of breath throughout the day     pantoprazole (PROTONIX) 40 MG tablet Take 1 tablet (40 mg total) by mouth 2 (two) times daily for 14 days. 28 tablet 0   potassium chloride SA (KLOR-CON M) 20 MEQ tablet Take 1 tablet (20 mEq total) by mouth daily. 90 tablet 1   predniSONE (DELTASONE) 10 MG tablet Take 4 pills once daily by mouth for 3 days, then 3 pills daily for 3 days, then 2 pills daily for 3 days then 1 pill daily for 3 days then stop 30 tablet 0   promethazine (PHENERGAN) 12.5 MG  tablet Take 1 tablet (12.5 mg total) by mouth every 12 (twelve) hours as needed for nausea or vomiting. 20 tablet 0   topiramate (TOPAMAX) 50 MG tablet Take 1 tablet (50 mg total) by mouth at bedtime. 90 tablet 0   Vitamin D, Ergocalciferol, (DRISDOL) 1.25 MG (50000 UNIT) CAPS capsule Take 50,000 Units by mouth once a week.     HYDROcodone bit-homatropine (HYCODAN) 5-1.5 MG/5ML syrup Take 5 mLs by mouth every 8 (eight) hours as needed for cough. Caution of sedation (Patient not taking: Reported on 12/26/2022) 80 mL 0   No current facility-administered medications for this visit.     PHYSICAL EXAMINATION: ECOG PERFORMANCE STATUS: 1 - Symptomatic but completely ambulatory Vitals:   12/26/22 1312  BP: (!) 163/80  Pulse: 74  Resp: 18  Temp: 99.2 F (37.3 C)   Filed Weights   12/26/22 1312  Weight: 188 lb 6.4 oz (85.5 kg)    Physical Exam Constitutional:      General: She is not in acute distress. HENT:     Head: Normocephalic and atraumatic.  Eyes:     General: No scleral icterus. Cardiovascular:     Rate and Rhythm: Normal rate and regular rhythm.     Heart sounds: Normal heart sounds.  Pulmonary:     Effort: Pulmonary effort is normal. No respiratory distress.     Breath sounds: No wheezing.  Abdominal:     General: Bowel sounds are normal. There is no distension.     Palpations: Abdomen is soft.  Musculoskeletal:        General: No deformity. Normal range  of motion.     Cervical back: Normal range of motion and neck supple.  Skin:    General: Skin is warm and dry.     Findings: No erythema or rash.  Neurological:     Mental Status: She is alert and oriented to person, place, and time. Mental status is at baseline.     Cranial Nerves: No cranial nerve deficit.     Coordination: Coordination normal.  Psychiatric:        Mood and Affect: Mood normal.     LABORATORY DATA:  I have reviewed the data as listed     Latest Ref Rng & Units 12/24/2022    8:54 AM 09/23/2022    11:05 AM 09/21/2022    3:09 PM  CBC  WBC 4.0 - 10.5 K/uL 8.1  9.9  9.1   Hemoglobin 12.0 - 15.0 g/dL 10.2  10.8  10.6   Hematocrit 36.0 - 46.0 % 31.3  34.8  32.0   Platelets 150 - 400 K/uL 332  313  260       Latest Ref Rng & Units 10/17/2022    3:17 PM 09/23/2022   11:05 AM 09/21/2022    3:09 PM  CMP  Glucose 70 - 99 mg/dL 110  114  103   BUN 6 - 23 mg/dL '19  10  9   '$ Creatinine 0.40 - 1.20 mg/dL 0.87  1.00  1.00   Sodium 135 - 145 mEq/L 135  140  138   Potassium 3.5 - 5.1 mEq/L 4.2  3.2  2.8   Chloride 96 - 112 mEq/L 102  105  103   CO2 19 - 32 mEq/L '21  27  21   '$ Calcium 8.4 - 10.5 mg/dL 9.2  8.6  8.9   Total Protein 6.0 - 8.3 g/dL 6.6  6.2  5.4   Total Bilirubin 0.2 - 1.2 mg/dL 0.5  0.5  0.7   Alkaline Phos 39 - 117 U/L 117  146  146   AST 0 - 37 U/L '11  16  8   '$ ALT 0 - 35 U/L 6  6  <5     Iron/TIBC/Ferritin/ %Sat    Component Value Date/Time   IRON 78 12/24/2022 0854   TIBC 259 12/24/2022 0854   FERRITIN 114 12/24/2022 0854   IRONPCTSAT 30 12/24/2022 0854       RADIOGRAPHIC STUDIES: I have personally reviewed the radiological images as listed and agreed with the findings in the report. VAS US RENAL ARTERY DUPLEX  Result Date: 12/13/2022 ABDOMINAL VISCERAL Patient Name:  BENTLEY HARALSON  Date of Exam:   12/04/2022 Medical Rec #: [id number]      Accession #:    [id number] Date of Birth: 08-02-1951       Patient Gender: F Patient Age:   89 years [facility]am [facility] Procedure:      VAS US RENAL ARTERY DUPLEX Referring Phys: [id number] JASON S DEW -------------------------------------------------------------------------------- Indications: Left renal artery stenosis by CTA Limitations: Air/bowel gas. Performing Technologist: Blondell Reveal RT, RDMS, RVT  Examination Guidelines: A complete evaluation includes B-mode imaging, spectral Doppler, color Doppler, and power Doppler as needed of all accessible portions of each vessel. Bilateral testing is considered an  integral part of a complete examination. Limited examinations for reoccurring indications may be performed as noted.  Duplex Findings: +---------+--------+--------+------+--------+          PSV cm/sEDV cm/sPlaqueComments +---------+--------+--------+------+--------+ Aorta Mid   59                          +---------+--------+--------+------+--------+  +------------------+--------+--------+-------+  Right Renal ArteryPSV cm/sEDV cm/sComment +------------------+--------+--------+-------+ Proximal             95      24           +------------------+--------+--------+-------+ Mid                  89      22           +------------------+--------+--------+-------+ Distal               70      21           +------------------+--------+--------+-------+ +-----------------+--------+--------+---------------------------------+ Left Renal ArteryPSV cm/sEDV cm/s             Comment              +-----------------+--------+--------+---------------------------------+ Proximal           195      77   turbulent flow noted in this area +-----------------+--------+--------+---------------------------------+ Mid                130      39                                     +-----------------+--------+--------+---------------------------------+ Distal              48      19                                     +-----------------+--------+--------+---------------------------------+  +------------------+-----+------------------+-----+ Right Kidney           Left Kidney             +------------------+-----+------------------+-----+ RAR (manual)      1.61 RAR (manual)      3.31  +------------------+-----+------------------+-----+ Kidney length (cm)11.20Kidney length (cm)11.57 +------------------+-----+------------------+-----+  Summary: Renal:  Right: No evidence of right renal artery stenosis. Normal size right        kidney. RRV flow present. Left:  Doppler velocities  suggest possible 1-59% stenosis of the        left renal artery, based on limited visualization of renal        artery origin. Normal size of left kidney. LRV flow present.  *See table(s) above for measurements and observations.  Diagnosing physician: Leotis Pain MD  Electronically signed by Leotis Pain MD on 12/13/2022 at 9:54:31 AM.    Final    VAS US CAROTID  Result Date: 12/13/2022 Carotid Arterial Duplex Study Patient Name:  AASHVI REZABEK  Date of Exam:   12/04/2022 Medical Rec #: [id number]      Accession #:    [id number] Date of Birth: [birth year]       Patient Gender: F Patient Age:   49 years Exam Location:  [facility] Procedure:      VAS US CAROTID Referring Phys: Eulogio Ditch --------------------------------------------------------------------------------  Indications: Carotid artery disease. Performing Technologist: Blondell Reveal RT, RDMS, RVT  Examination Guidelines: A complete evaluation includes B-mode imaging, spectral Doppler, color Doppler, and power Doppler as needed of all accessible portions of each vessel. Bilateral testing is considered an integral part of a complete examination. Limited examinations for reoccurring indications may be performed as noted.  Right Carotid Findings: +----------+--------+--------+--------+------------------+--------+           PSV cm/sEDV cm/sStenosisPlaque DescriptionComments +----------+--------+--------+--------+------------------+--------+ CCA Prox  72      15                                         +----------+--------+--------+--------+------------------+--------+ CCA Mid   78      17                                         +----------+--------+--------+--------+------------------+--------+ CCA Distal72      20                                         +----------+--------+--------+--------+------------------+--------+ ICA Prox  125     44      40-59%  heterogenous                +----------+--------+--------+--------+------------------+--------+ ICA Mid   87      26                                         +----------+--------+--------+--------+------------------+--------+ ICA Distal72      20                                         +----------+--------+--------+--------+------------------+--------+ ECA       91      20              heterogenous               +----------+--------+--------+--------+------------------+--------+ +----------+--------+-------+----------------+-------------------+           PSV cm/sEDV cmsDescribe        Arm Pressure (mmHG) +----------+--------+-------+----------------+-------------------+ [id number]             Multiphasic, WNL                    +----------+--------+-------+----------------+-------------------+ +---------+--------+--+--------+---------+ VertebralPSV cm/s48EDV cm/sAntegrade +---------+--------+--+--------+---------+ Left Carotid Findings: +----------+--------+--------+--------+------------------+--------+           PSV cm/sEDV cm/sStenosisPlaque DescriptionComments +----------+--------+--------+--------+------------------+--------+ CCA Prox  79      22                                         +----------+--------+--------+--------+------------------+--------+ CCA Mid   71      19                                         +----------+--------+--------+--------+------------------+--------+ CCA Distal81      22                                         +----------+--------+--------+--------+------------------+--------+ ICA Prox  88      30      1-39%   calcific                   +----------+--------+--------+--------+------------------+--------+  ICA Mid   71      25                                         +----------+--------+--------+--------+------------------+--------+ ICA Distal67      23                                          +----------+--------+--------+--------+------------------+--------+ ECA       74      8                                          +----------+--------+--------+--------+------------------+--------+ +----------+--------+--------+----------------+-------------------+           PSV cm/sEDV cm/sDescribe        Arm Pressure (mmHG) +----------+--------+--------+----------------+-------------------+ [id number]              Multiphasic, WNL                    +----------+--------+--------+----------------+-------------------+ +---------+--------+--+--------+---------+ VertebralPSV cm/s53EDV cm/sAntegrade +---------+--------+--+--------+---------+  Summary: Right Carotid: Velocities in the right ICA are consistent with a 40-59%                stenosis. Left Carotid: The extracranial vessels were near-normal with only minimal wall               thickening or plaque. *See table(s) above for measurements and observations.  No significant change when compared to the previous exam on 06/06/22. Electronically signed by Leotis Pain MD on 12/13/2022 at 9:54:17 AM.    Final    DG Chest 2 View  Result Date: 12/11/2022 CLINICAL DATA:  Cough and wheezing. EXAM: CHEST - 2 VIEW COMPARISON:  Chest x-ray dated August 08, 2022. FINDINGS: The heart size and mediastinal contours are within normal limits. Normal pulmonary vascularity. The lungs remain hyperinflated with emphysematous changes and coarsened interstitial markings. No focal consolidation, pleural effusion, or pneumothorax. No acute osseous abnormality. Chronic T9 and T10 compression deformity status post cement augmentation again noted. IMPRESSION: 1. No acute cardiopulmonary disease. 2. COPD. Electronically Signed   By: Titus Dubin M.D.   On: 12/11/2022 12:34

## 2022-12-26 NOTE — Assessment & Plan Note (Signed)
Iron deficiency anemia in the context of chronic kidney disease.  Labs are reviewed and discussed with patient. I recommend Venofer weekly x 2 to further  improve Hb Hb >10, no need for erythropoitein therapy.

## 2022-12-27 DIAGNOSIS — A048 Other specified bacterial intestinal infections: Secondary | ICD-10-CM | POA: Diagnosis not present

## 2022-12-27 DIAGNOSIS — I509 Heart failure, unspecified: Secondary | ICD-10-CM | POA: Diagnosis not present

## 2022-12-27 DIAGNOSIS — J449 Chronic obstructive pulmonary disease, unspecified: Secondary | ICD-10-CM | POA: Diagnosis not present

## 2022-12-30 IMAGING — DX DG CHEST 2V
2 series · 2 of 2 positions shown · non-contrast
Comparison: 08/28/2021

CLINICAL DATA: Dyspnea.  Sore throat and cough.

EXAM:
CHEST - 2 VIEW

[chest pa]
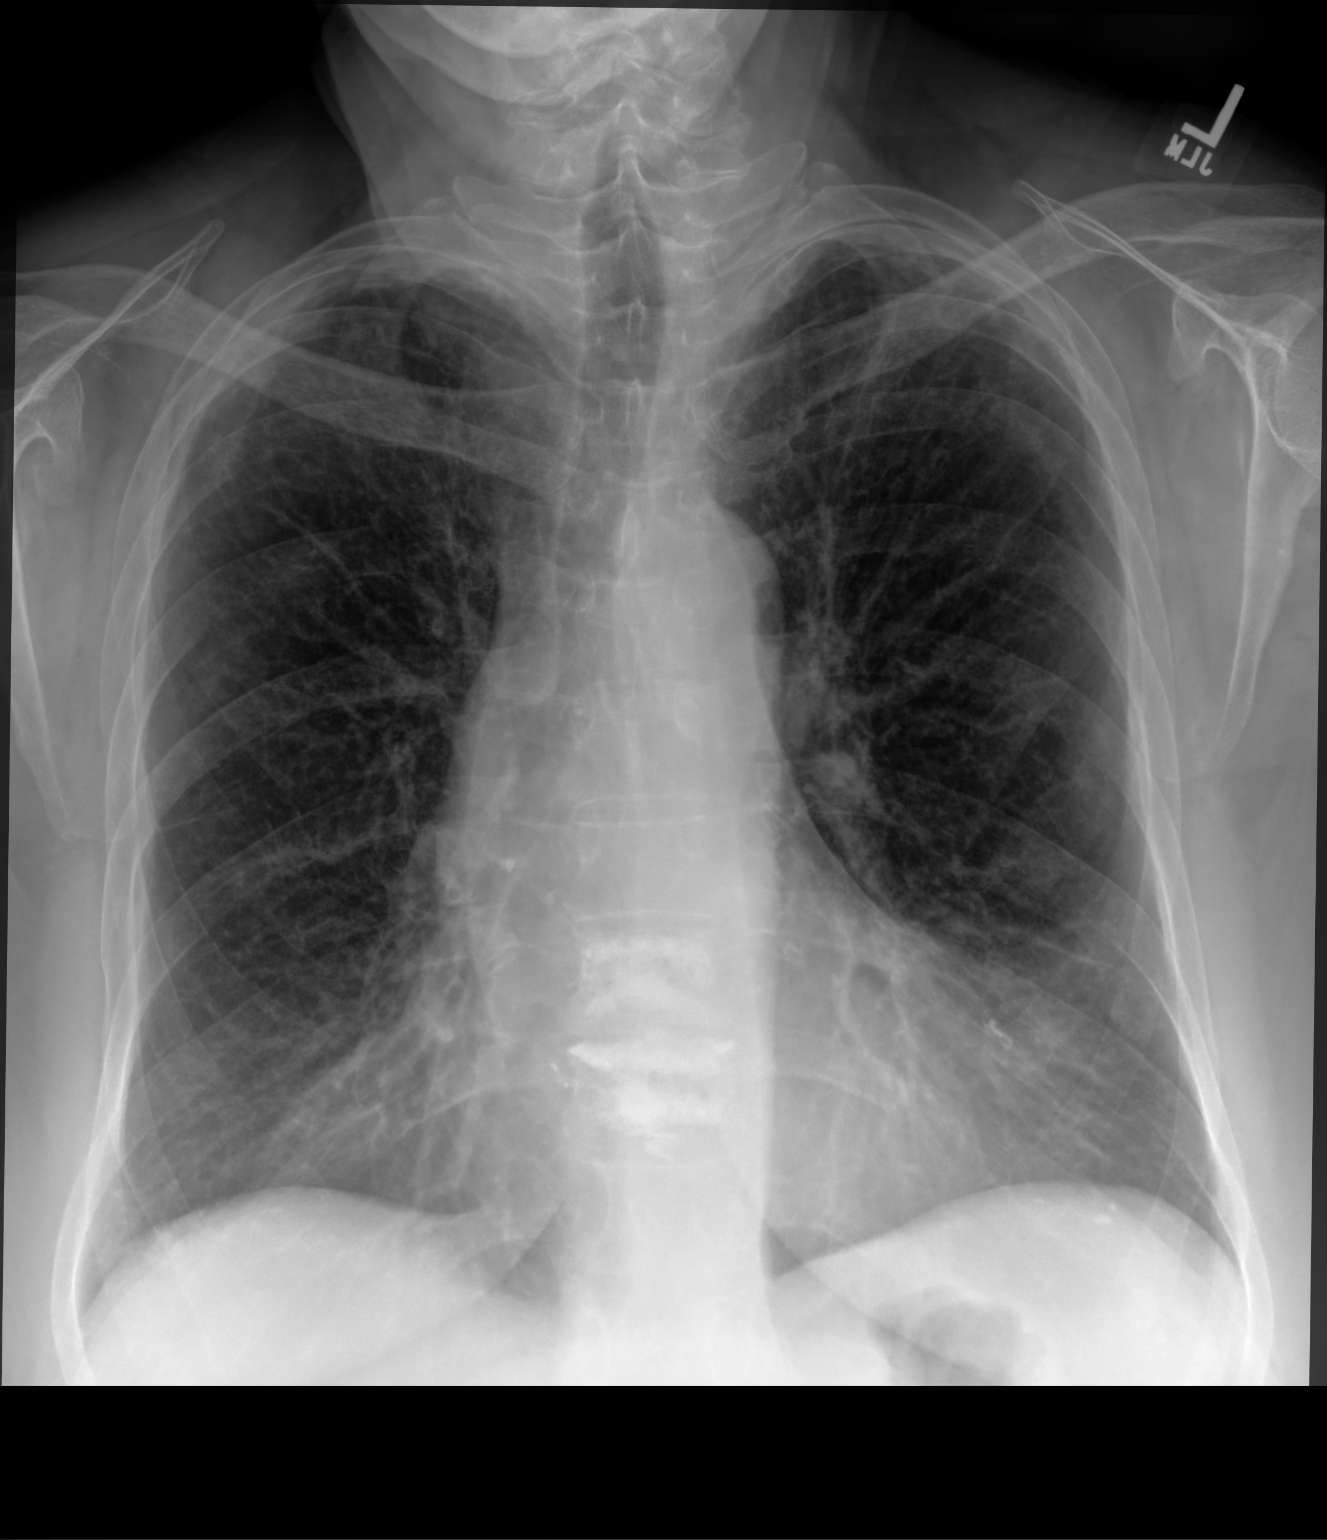

[chest lat]
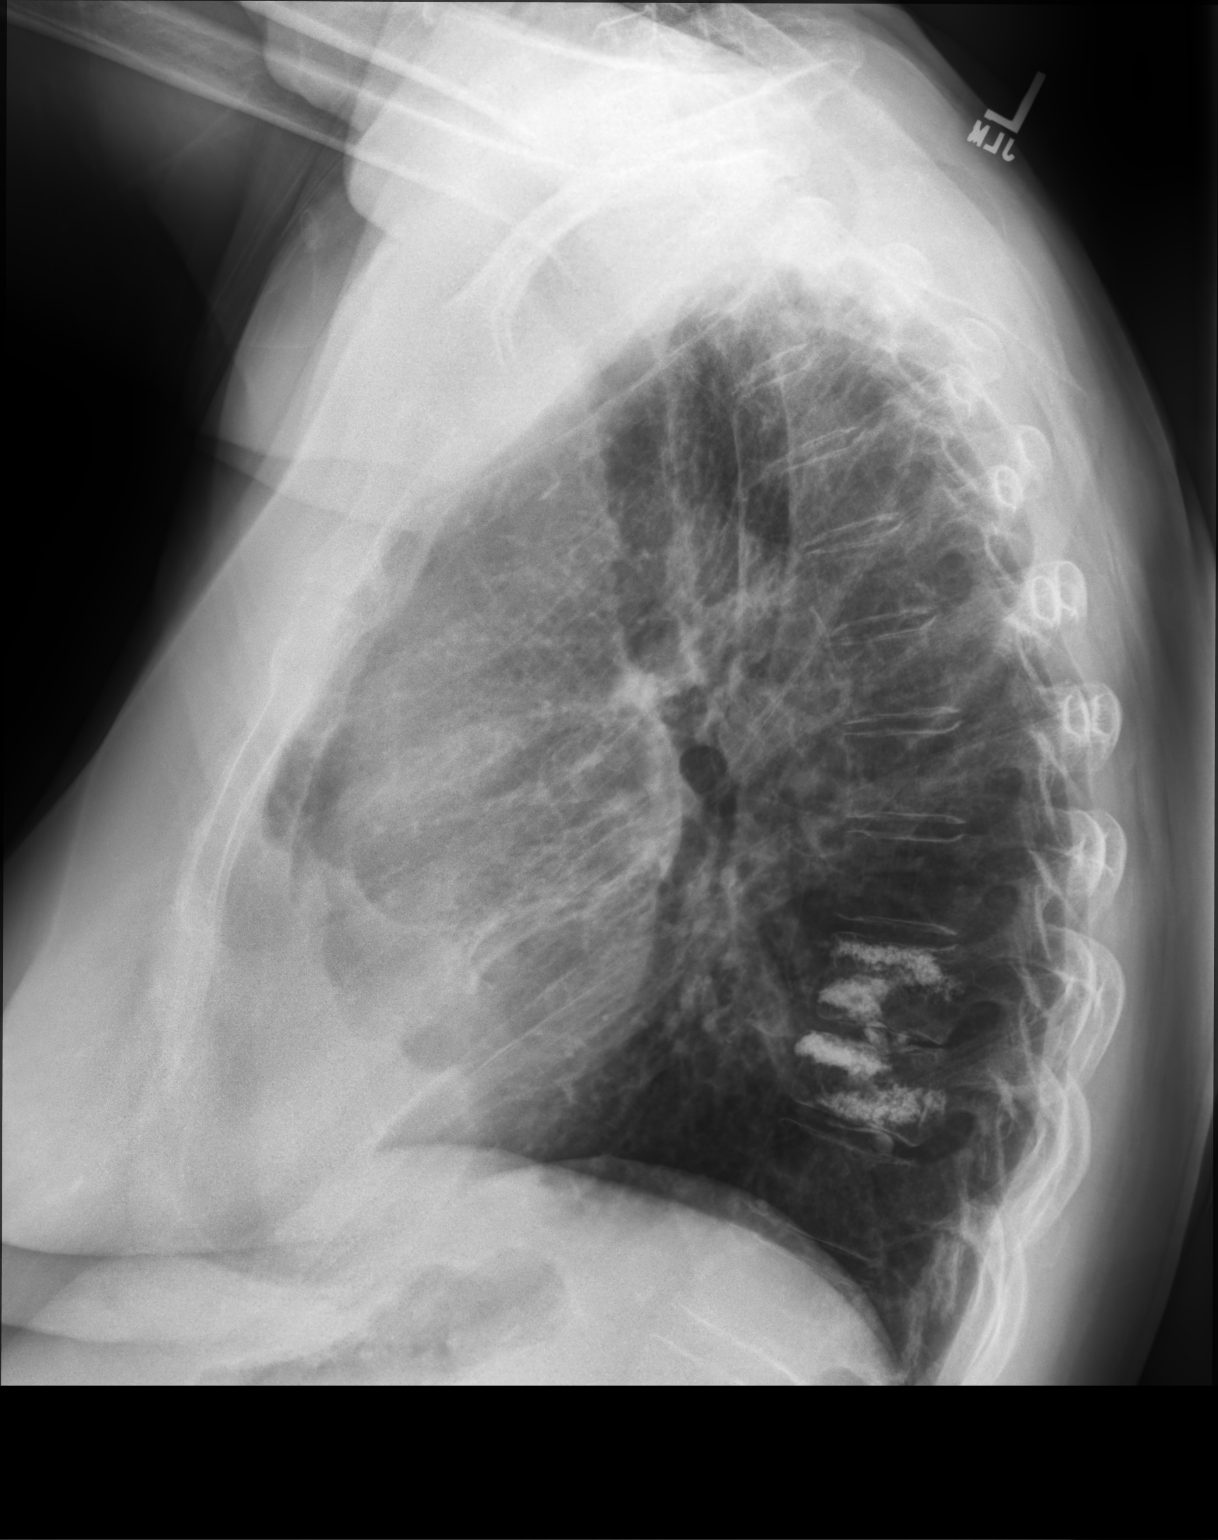

[2 of 2 positions shown; findings below may reference images not displayed]

FINDINGS: The cardiomediastinal silhouette is unchanged with normal heart
size. The lungs are hyperinflated with chronic interstitial
coarsening. No airspace consolidation, overt pulmonary edema,
pleural effusion, or pneumothorax is identified. Prior thoracic
vertebral augmentation and an old sternal fracture are again noted.
IMPRESSION: No active cardiopulmonary disease.

## 2023-01-01 ENCOUNTER — Inpatient Hospital Stay: Payer: Medicare HMO

## 2023-01-01 VITALS — BP 146/87 | HR 80 | Temp 97.2°F | Resp 18

## 2023-01-01 DIAGNOSIS — D508 Other iron deficiency anemias: Secondary | ICD-10-CM

## 2023-01-01 DIAGNOSIS — Z87891 Personal history of nicotine dependence: Secondary | ICD-10-CM | POA: Diagnosis not present

## 2023-01-01 DIAGNOSIS — N183 Chronic kidney disease, stage 3 unspecified: Secondary | ICD-10-CM | POA: Diagnosis not present

## 2023-01-01 DIAGNOSIS — I509 Heart failure, unspecified: Secondary | ICD-10-CM | POA: Diagnosis not present

## 2023-01-01 DIAGNOSIS — I130 Hypertensive heart and chronic kidney disease with heart failure and stage 1 through stage 4 chronic kidney disease, or unspecified chronic kidney disease: Secondary | ICD-10-CM | POA: Diagnosis not present

## 2023-01-01 DIAGNOSIS — N1831 Chronic kidney disease, stage 3a: Secondary | ICD-10-CM

## 2023-01-01 DIAGNOSIS — D509 Iron deficiency anemia, unspecified: Secondary | ICD-10-CM | POA: Diagnosis not present

## 2023-01-01 MED ORDER — SODIUM CHLORIDE 0.9 % IV SOLN
INTRAVENOUS | Status: DC
  Filled 2023-01-01: qty 250

## 2023-01-01 MED ORDER — SODIUM CHLORIDE 0.9 % IV SOLN
200.0000 mg | Freq: Once | INTRAVENOUS | Status: AC
  Administered 2023-01-01: 200 mg via INTRAVENOUS
  Filled 2023-01-01: qty 200

## 2023-01-01 NOTE — Patient Instructions (Signed)
Iron Sucrose Injection What is this medication? IRON SUCROSE (EYE ern SOO krose) treats low levels of iron (iron deficiency anemia) in people with kidney disease. Iron is a mineral that plays an important role in making red blood cells, which carry oxygen from your lungs to the rest of your body. This medicine may be used for other purposes; ask your health care provider or pharmacist if you have questions. COMMON BRAND NAME(S): Venofer What should I tell my care team before I take this medication? They need to know if you have any of these conditions: Anemia not caused by low iron levels Heart disease High levels of iron in the blood Kidney disease Liver disease An unusual or allergic reaction to iron, other medications, foods, dyes, or preservatives Pregnant or trying to get pregnant Breastfeeding How should I use this medication? This medication is for infusion into a vein. It is given in a hospital or clinic setting. Talk to your care team about the use of this medication in children. While this medication may be prescribed for children as young as 2 years for selected conditions, precautions do apply. Overdosage: If you think you have taken too much of this medicine contact a poison control center or emergency room at once. NOTE: This medicine is only for you. Do not share this medicine with others. What if I miss a dose? Keep appointments for follow-up doses. It is important not to miss your dose. Call your care team if you are unable to keep an appointment. What may interact with this medication? Do not take this medication with any of the following: Deferoxamine Dimercaprol Other iron products This medication may also interact with the following: Chloramphenicol Deferasirox This list may not describe all possible interactions. Give your health care provider a list of all the medicines, herbs, non-prescription drugs, or dietary supplements you use. Also tell them if you smoke,  drink alcohol, or use illegal drugs. Some items may interact with your medicine. What should I watch for while using this medication? Visit your care team regularly. Tell your care team if your symptoms do not start to get better or if they get worse. You may need blood work done while you are taking this medication. You may need to follow a special diet. Talk to your care team. Foods that contain iron include: whole grains/cereals, dried fruits, beans, or peas, leafy green vegetables, and organ meats (liver, kidney). What side effects may I notice from receiving this medication? Side effects that you should report to your care team as soon as possible: Allergic reactions--skin rash, itching, hives, swelling of the face, lips, tongue, or throat Low blood pressure--dizziness, feeling faint or lightheaded, blurry vision Shortness of breath Side effects that usually do not require medical attention (report to your care team if they continue or are bothersome): Flushing Headache Joint pain Muscle pain Nausea Pain, redness, or irritation at injection site This list may not describe all possible side effects. Call your doctor for medical advice about side effects. You may report side effects to FDA at 1-800-FDA-1088. Where should I keep my medication? This medication is given in a hospital or clinic and will not be stored at home. NOTE: This sheet is a summary. It may not cover all possible information. If you have questions about this medicine, talk to your doctor, pharmacist, or health care provider.  2023 Elsevier/Gold Standard (2021-03-16 00:00:00)

## 2023-01-05 IMAGING — RF DG ESOPHAGUS
9 of 10 series · 14 of 24 positions shown · non-contrast
Comparison: Esophagram 11/29/2020

CLINICAL DATA: Dysphagia, difficulty dry swallowing, feels like
food getting stuck in throat

EXAM:
ESOPHOGRAM / BARIUM SWALLOW / BARIUM TABLET STUDY
TECHNIQUE: Combined double contrast and single contrast examination performed
using effervescent crystals, thick barium liquid, and thin barium
liquid. The patient was observed with fluoroscopy swallowing a 13 mm
barium sulphate tablet.
FLUOROSCOPY TIME:  Fluoroscopy Time:  5.3 minutes
Radiation Exposure Index (if provided by the fluoroscopic device):
82.8 mGy
Number of Acquired Spot Images: 0

[Series 1: cp_bariatric · 0.17mm/px · 1 of 1 slices shown (1 of 9)]
[im 1/1]
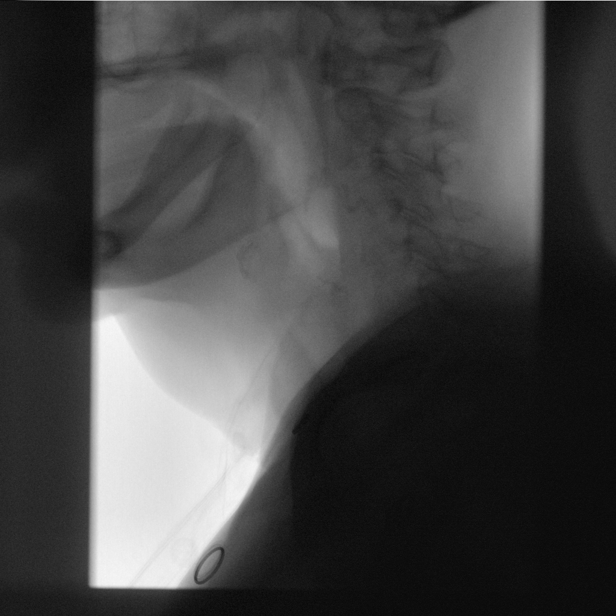

[Series 2: cp_bariatric · 0.17mm/px · 1 of 82 frames shown (2 of 9)]
[frame 42/82]
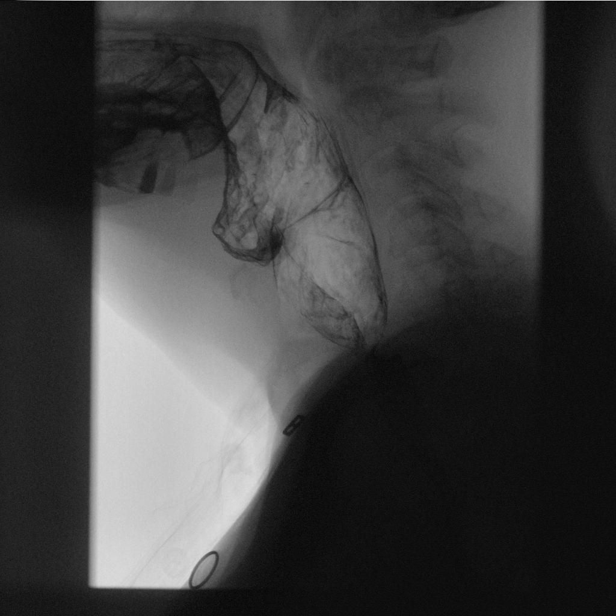

[Series 3: cp_bariatric · 0.17mm/px · 2 of 102 frames shown (3 of 9)]
[frame 16/102]
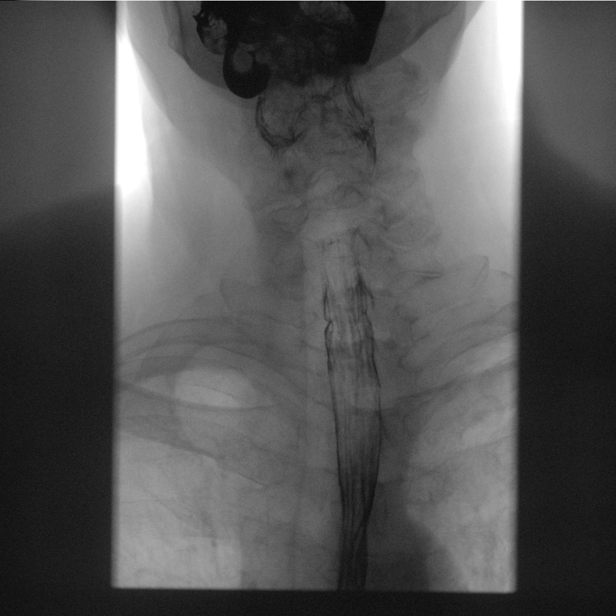
[frame 99/102]
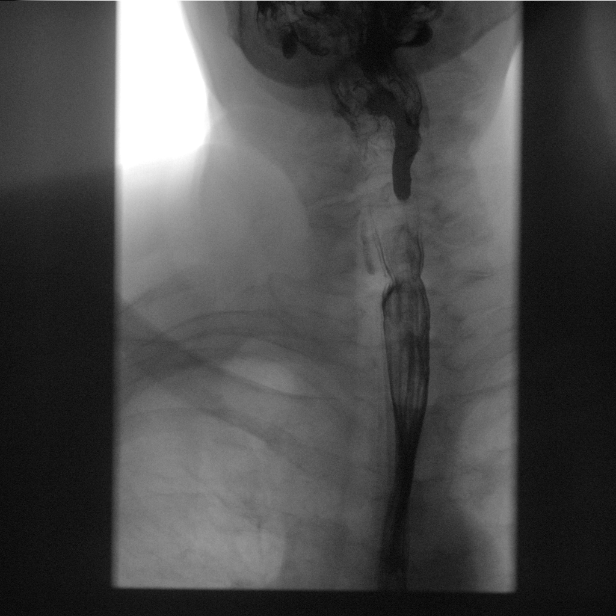

[Series 4: cp_bariatric · 0.26mm/px · 2 of 143 frames shown (4 of 9)]
[frame 22/143]
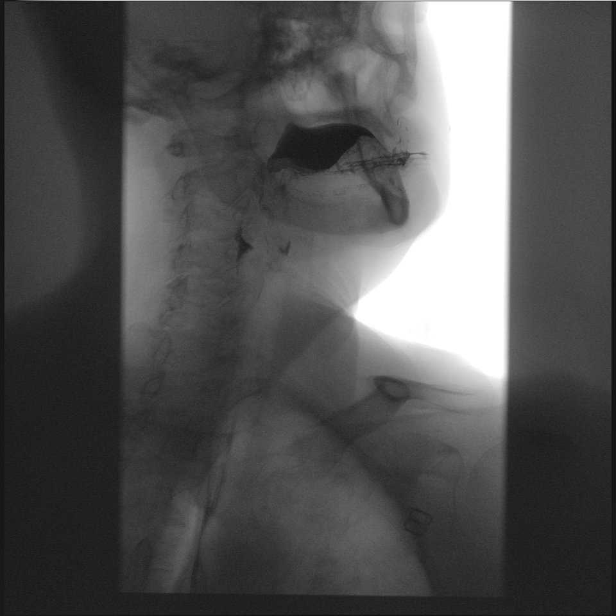
[frame 122/143]
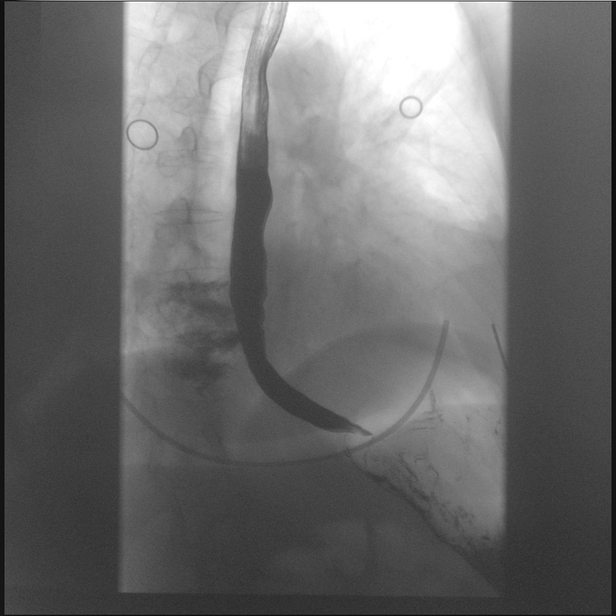

[Series 5: cp_bariatric · 0.26mm/px · 2 of 165 frames shown (5 of 9)]
[frame 59/165]
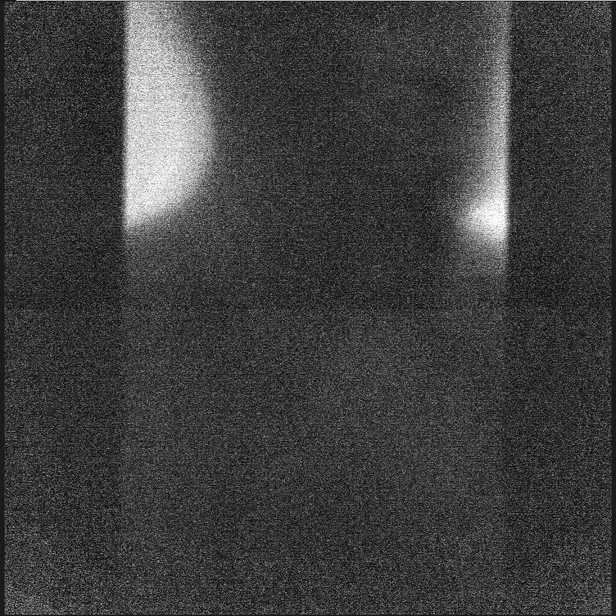
[frame 141/165]
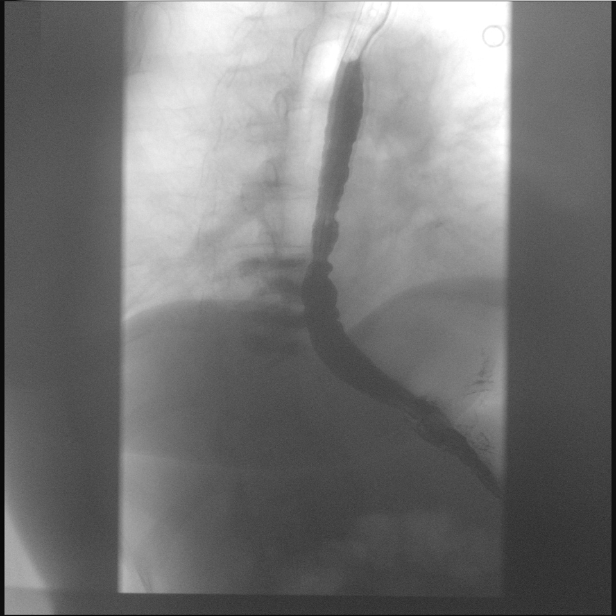

[Series 6: cp_bariatric · 0.26mm/px · 1 of 135 frames shown (6 of 9)]
[frame 68/135]
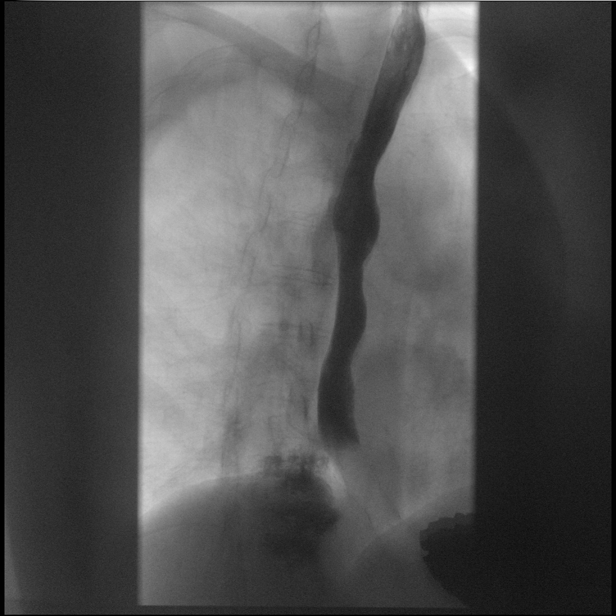

[Series 7: cp_bariatric · 0.26mm/px · 2 of 93 frames shown (7 of 9)]
[frame 14/93]
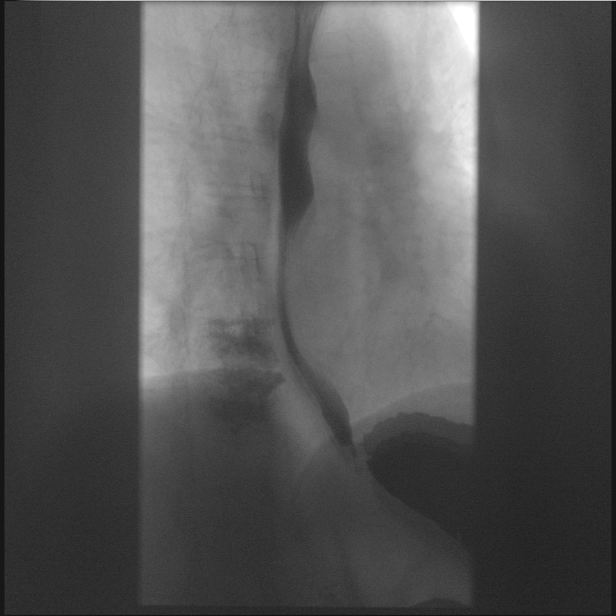
[frame 47/93]
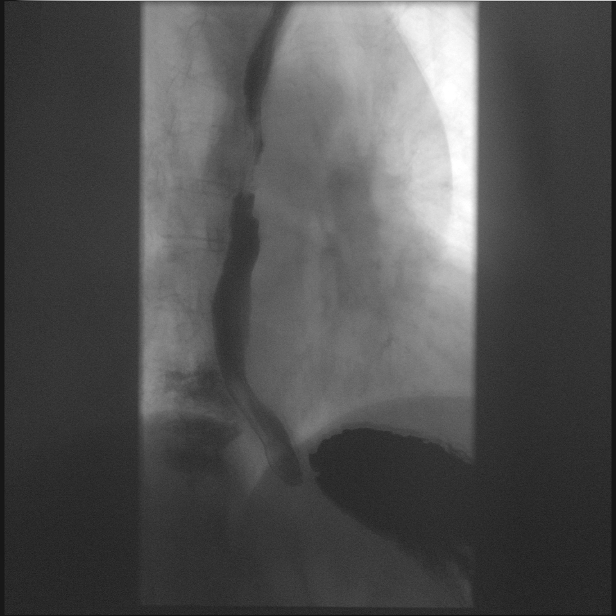

[Series 8: cp_bariatric · 0.26mm/px · 2 of 198 frames shown (8 of 9)]
[frame 30/198]
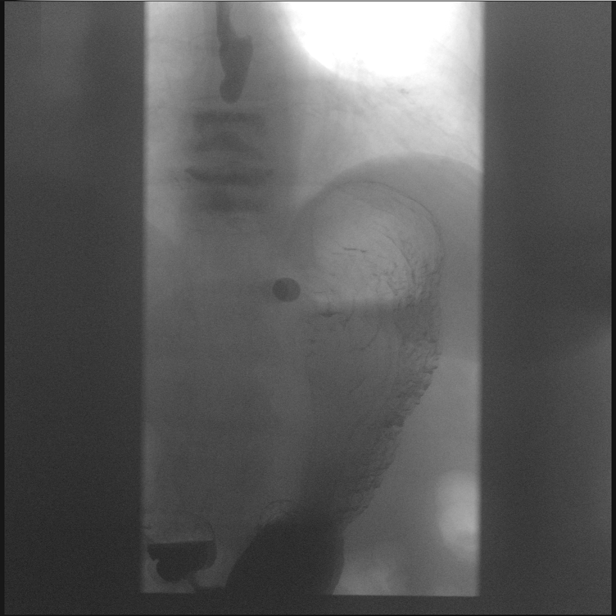
[frame 169/198]
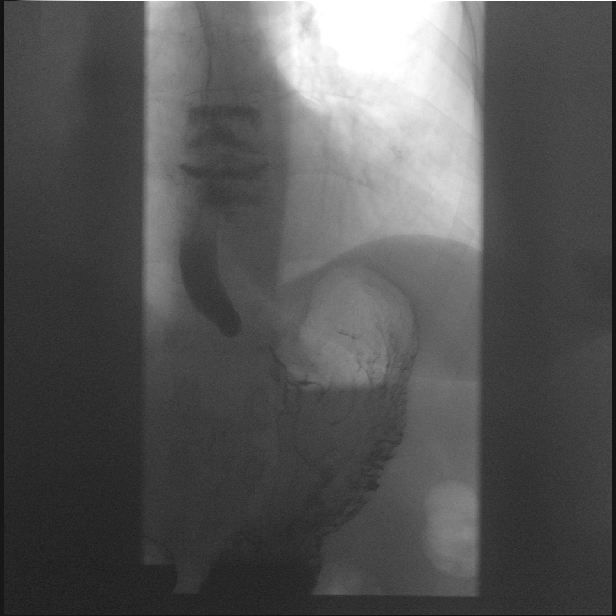

[Series 10: cp_bariatric · 0.26mm/px · 1 of 1 slices shown (9 of 9)]
[im 1/1]
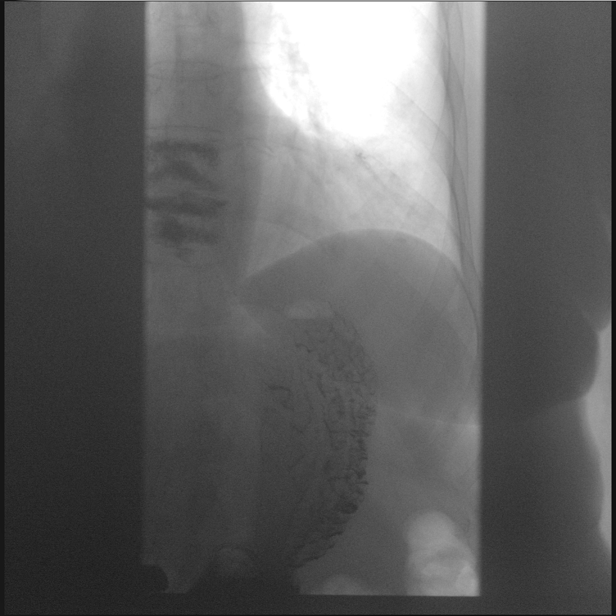

[14 of 24 positions shown; findings below may reference images not displayed]

FINDINGS: Esophageal distention: The esophagus demonstrated normal
distensibility. The GE junction appeared to distend normally.

Filling defects:  No discrete filling defects were identified.

12.5 mm barium tablet: The barium tablet held up at the GE junction
for several minutes and persisted with multiple sips of water and
barium.

Motility: There is moderate dysmotility with uncoordinated
peristaltic stripping wave.

Mucosa:  No discrete mucosal lesions are identified.

Hypopharynx/cervical esophagus:  No aspiration was identified.

Hiatal hernia:  None identified.

GE reflux:  There was reflux to the level of the thoracic inlet.

Other:  None.
IMPRESSION: 1. The barium tablet held up at the GE junction for several minutes.
No discrete mucosal lesion or filling defect was identified.
Consider correlation with endoscopy.
2. Moderate dysmotility.
3. Esophageal reflux to the level of the thoracic inlet.

## 2023-01-07 IMAGING — DX DG CHEST 2V
2 series · 2 of 2 positions shown · non-contrast
Comparison: Chest x-ray 09/12/2021.

CLINICAL DATA: Cough.

EXAM:
CHEST - 2 VIEW

[chest pa]
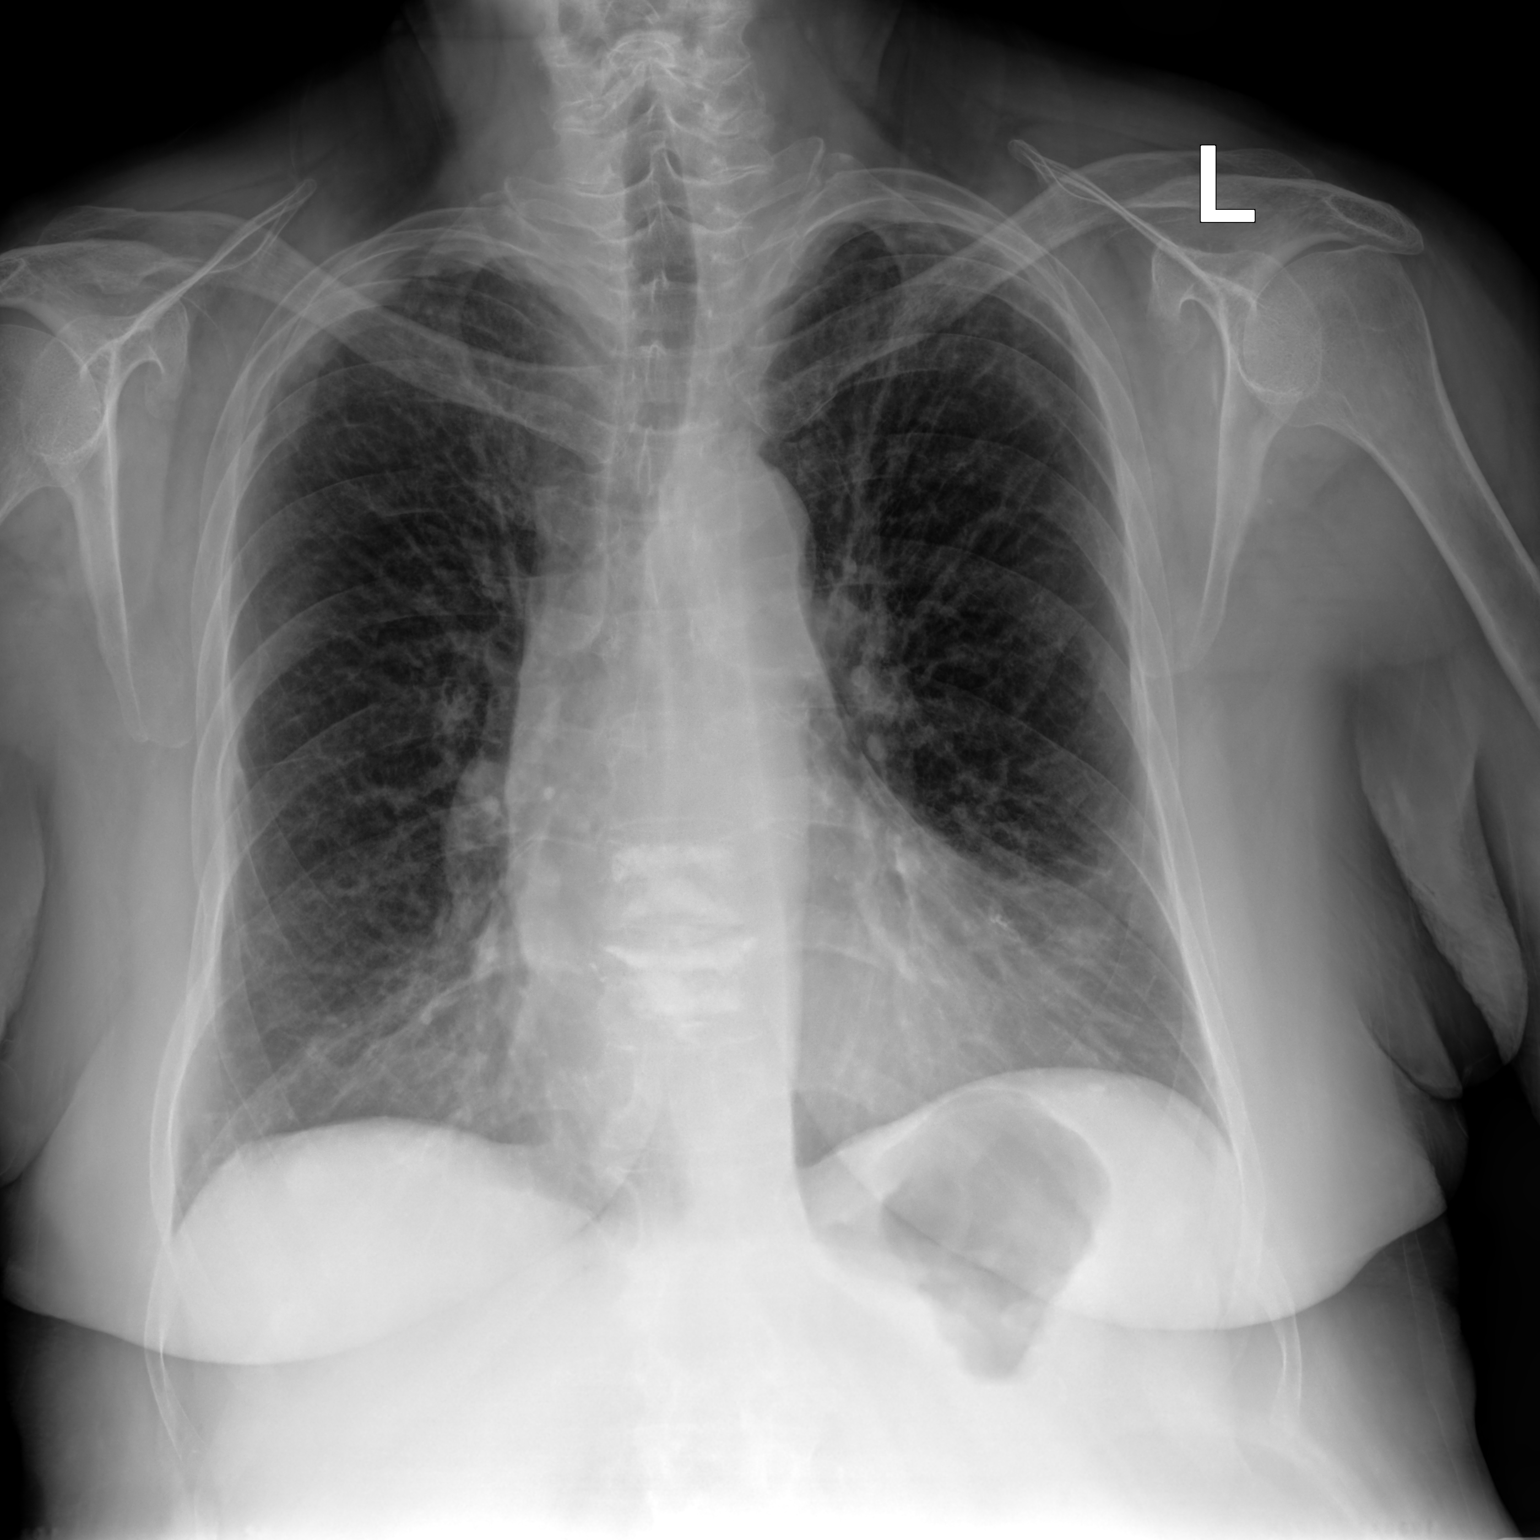

[chest lat]
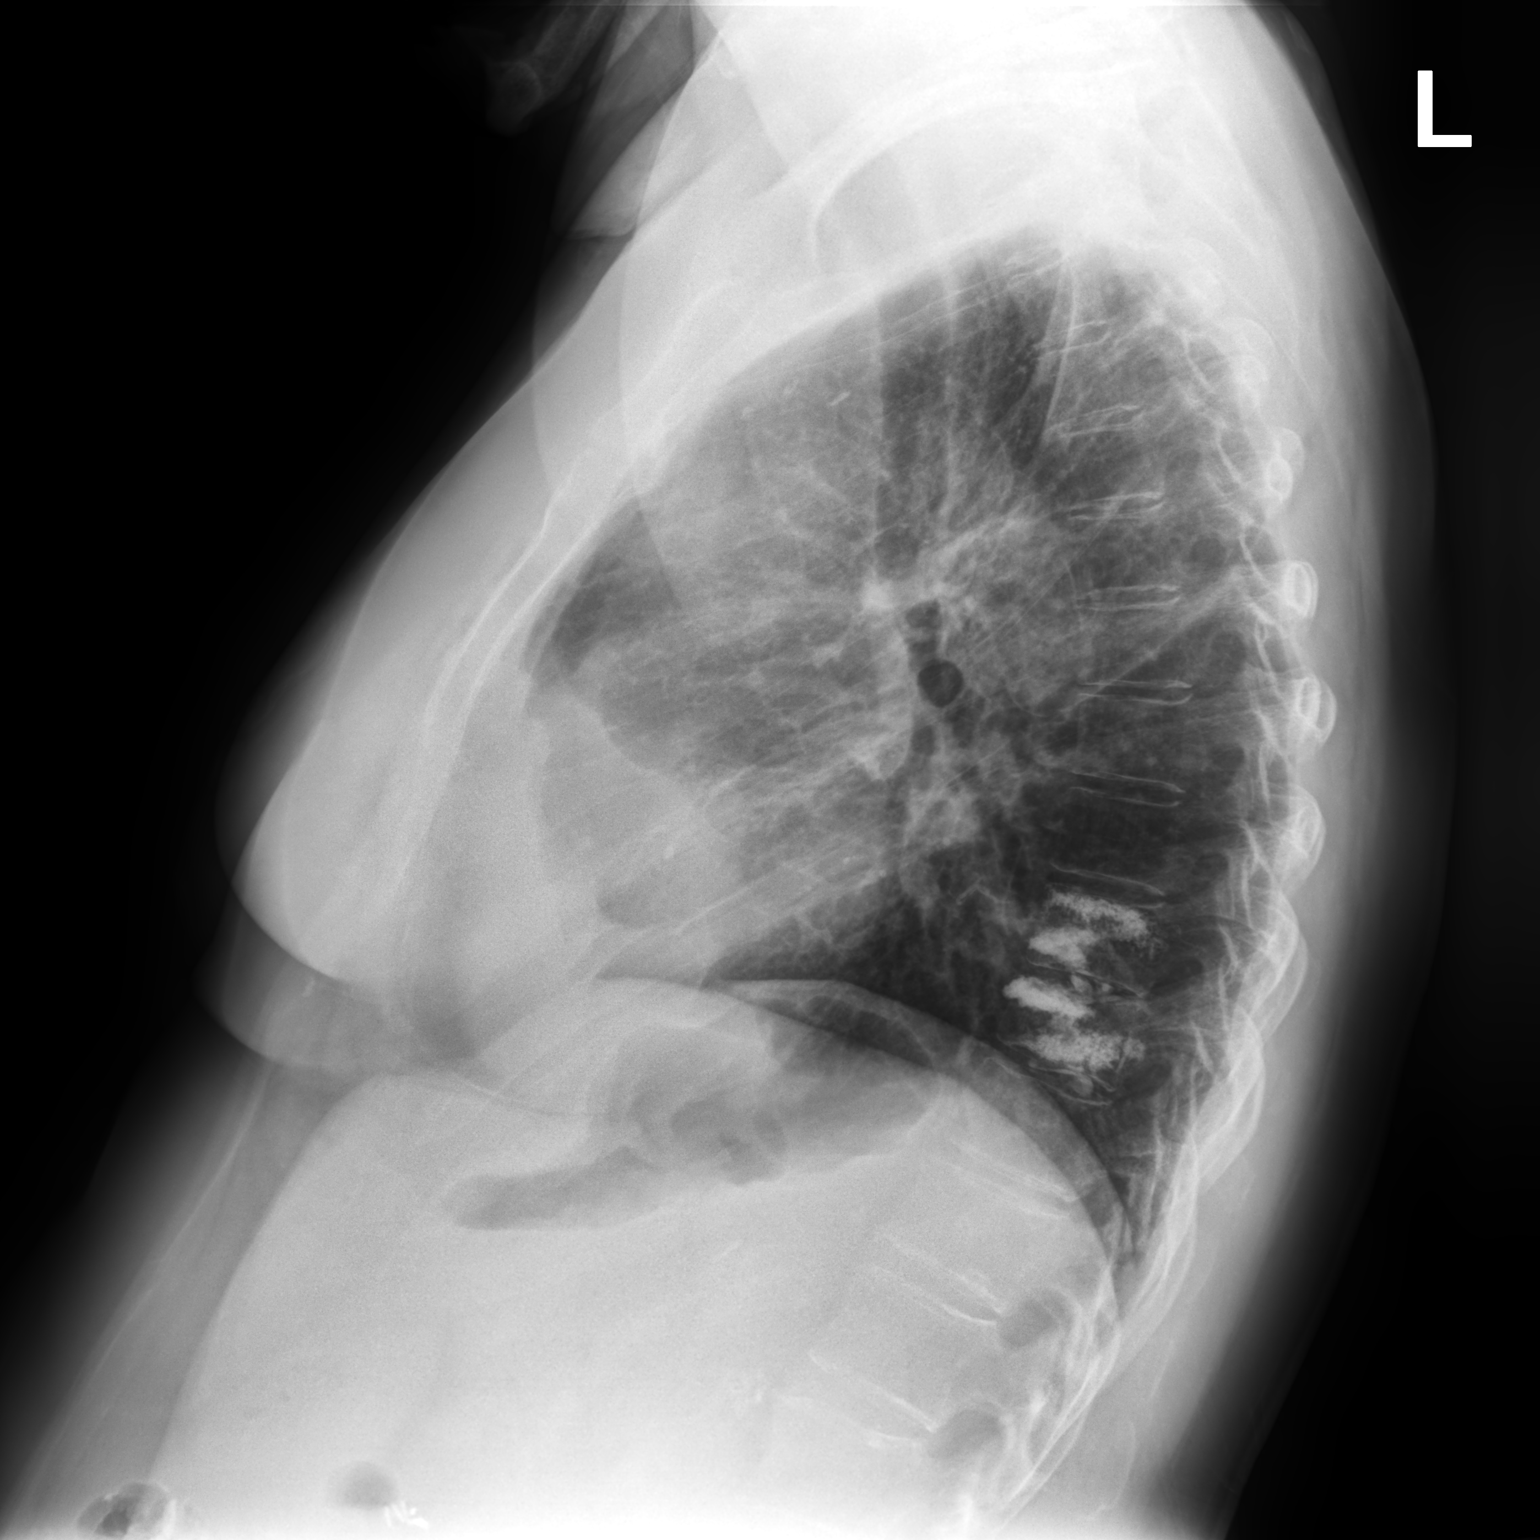

[2 of 2 positions shown; findings below may reference images not displayed]

FINDINGS: The heart size and mediastinal contours are within normal limits.
Both lungs are clear. Multiple thoracic level vertebroplasty changes
are again noted. There is stable scarring in both lung apices.
IMPRESSION: No active cardiopulmonary disease.

## 2023-01-22 IMAGING — DX DG CHEST 1V PORT
1 series · 1 of 1 positions shown · non-contrast
Comparison: Prior chest radiographs 09/20/2021 and earlier.
Esophagram 09/18/2021.

CLINICAL DATA: Shortness of breath. Additional history provided:
Patient reports left-sided chest pain and vomiting today. History of
hypertension and CHF.

EXAM:
PORTABLE CHEST 1 VIEW

[chest ap]
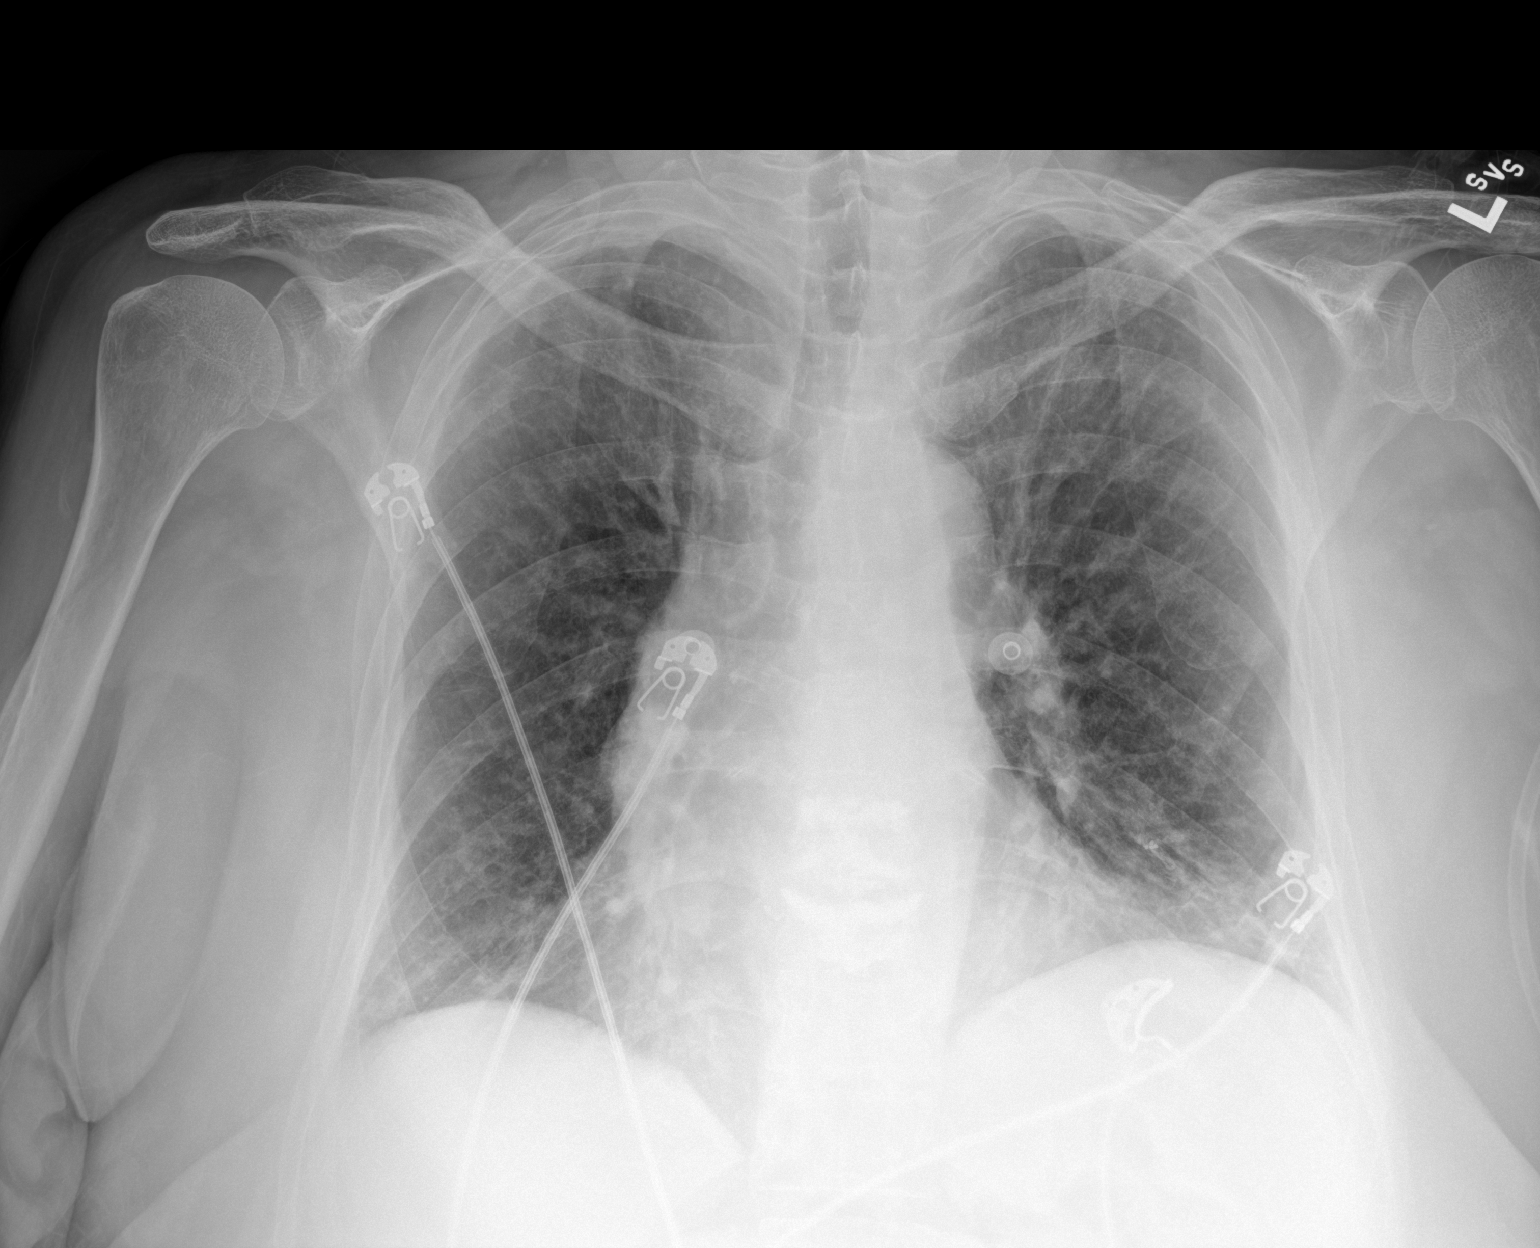

[1 of 1 positions shown; findings below may reference images not displayed]

FINDINGS: Heart size within normal limits. Unchanged chronic prominence of the
interstitial lung markings. Mild ill-defined opacity at the lateral
left lung base, likely reflecting atelectasis. No evidence of
pleural effusion or pneumothorax. No acute bony abnormality
identified. Redemonstrated sequela of prior thoracic vertebral
augmentation.
IMPRESSION: Mild ill-defined opacity at the lateral left lung base, likely
reflecting atelectasis.

Otherwise, no evidence of acute cardiopulmonary abnormality.

## 2023-01-27 DIAGNOSIS — J449 Chronic obstructive pulmonary disease, unspecified: Secondary | ICD-10-CM | POA: Diagnosis not present

## 2023-01-27 DIAGNOSIS — I509 Heart failure, unspecified: Secondary | ICD-10-CM | POA: Diagnosis not present

## 2023-01-28 ENCOUNTER — Encounter: Payer: Self-pay | Admitting: Internal Medicine

## 2023-01-28 ENCOUNTER — Ambulatory Visit (INDEPENDENT_AMBULATORY_CARE_PROVIDER_SITE_OTHER): Payer: Medicare HMO | Admitting: Internal Medicine

## 2023-01-28 ENCOUNTER — Ambulatory Visit (INDEPENDENT_AMBULATORY_CARE_PROVIDER_SITE_OTHER)
Admission: RE | Admit: 2023-01-28 | Discharge: 2023-01-28 | Disposition: A | Discharge status: Home / Self Care | Payer: Medicare HMO | Source: Ambulatory Visit | Attending: Internal Medicine | Admitting: Internal Medicine

## 2023-01-28 VITALS — BP 128/80 | HR 91 | Temp 97.1°F | Ht 72.0 in | Wt 182.0 lb

## 2023-01-28 DIAGNOSIS — R051 Acute cough: Secondary | ICD-10-CM | POA: Diagnosis not present

## 2023-01-28 DIAGNOSIS — J0140 Acute pansinusitis, unspecified: Secondary | ICD-10-CM

## 2023-01-28 DIAGNOSIS — R059 Cough, unspecified: Secondary | ICD-10-CM | POA: Insufficient documentation

## 2023-01-28 DIAGNOSIS — L03032 Cellulitis of left toe: Secondary | ICD-10-CM | POA: Insufficient documentation

## 2023-01-28 DIAGNOSIS — R0602 Shortness of breath: Secondary | ICD-10-CM | POA: Diagnosis not present

## 2023-01-28 DIAGNOSIS — J449 Chronic obstructive pulmonary disease, unspecified: Secondary | ICD-10-CM | POA: Diagnosis not present

## 2023-01-28 MED ORDER — AMOXICILLIN-POT CLAVULANATE 875-125 MG PO TABS: 1.0000 | ORAL_TABLET | Freq: Two times a day (BID) | ORAL | 1 refills | Status: DC

## 2023-01-28 NOTE — Assessment & Plan Note (Signed)
Clearly has sinus infection--but not sure about pneumonia (due to findings on exam) Will check CXR

## 2023-01-28 NOTE — Progress Notes (Signed)
Subjective:    Patient ID: Morgan [name], female    DOB: [birth year], 72 y.o.   MRN: [id number]  HPI Here due to respiratory infection--and possible toenail infection  Started with throat and sinus congestion Throat raspy Now in chest--coughing up green phlegm Started about a week ago No fever or sweats. Gets cold then hot Some SOB---just with cough  Robitussin and dayquil not helping  Generalized headache Has post nasal drip Left ear is stopped up  Got nails done 3 days ago Told she has ingrown nail ---left great toe When treated--pus came out Toe now purple Is sore  Current Outpatient Medications on File Prior to Visit  Medication Sig Dispense Refill   acetaminophen (TYLENOL) 325 MG tablet Take 2 tablets (650 mg total) by mouth every 4 (four) hours as needed for mild pain (or temp > 37.5 C (99.5 F)).     carbidopa-levodopa (SINEMET IR) 25-100 MG tablet Take 2 tablets by mouth 3 (three) times daily.     cyanocobalamin (VITAMIN B12) 1000 MCG/ML injection Inject 1,000 mcg into the muscle once a week.     hydrOXYzine (ATARAX) 25 MG tablet TAKE 1 TABLET (25 MG TOTAL) BY MOUTH 2 (TWO) TIMES DAILY AS NEEDED FOR ANXIETY OR NAUSEA. 180 tablet 1   ibuprofen (ADVIL) 400 MG tablet Take 1 tablet (400 mg total) by mouth every 8 (eight) hours as needed for headache or moderate pain. 20 tablet 0   ondansetron (ZOFRAN-ODT) 4 MG disintegrating tablet Take 1 tablet (4 mg total) by mouth every 8 (eight) hours as needed for nausea or vomiting. 20 tablet 0   OXYGEN Inhale 3 L/min into the lungs See admin instructions. 3 L/min at bedtime and as needed for shortness of breath throughout the day     potassium chloride SA (KLOR-CON M) 20 MEQ tablet Take 1 tablet (20 mEq total) by mouth daily. 90 tablet 1   promethazine (PHENERGAN) 12.5 MG tablet Take 1 tablet (12.5 mg total) by mouth every 12 (twelve) hours as needed for nausea or vomiting. 20 tablet 0   topiramate (TOPAMAX) 50 MG tablet Take 1 tablet  (50 mg total) by mouth at bedtime. 90 tablet 0   Vitamin D, Ergocalciferol, (DRISDOL) 1.25 MG (50000 UNIT) CAPS capsule Take 50,000 Units by mouth once a week.     pantoprazole (PROTONIX) 40 MG tablet Take 1 tablet (40 mg total) by mouth 2 (two) times daily for 14 days. 28 tablet 0   No current facility-administered medications on file prior to visit.    Allergies  Allergen Reactions   Meperidine Hives and Anaphylaxis   Strawberry Extract Hives and Swelling   Sucralfate Nausea Only and Other (See Comments)    Severe nausea Other reaction(s): Other (See Comments) Severe nausea Severe nausea   Wasp Venom Hives    Past Medical History:  Diagnosis Date   Allergy    Anemia    Anxiety    Arthritis    CHF (congestive heart failure) (HCC)    COPD (chronic obstructive pulmonary disease) (HCC)    Depression    GERD (gastroesophageal reflux disease)    Hypertension    MVP (mitral valve prolapse)    Parkinson disease     Past Surgical History:  Procedure Laterality Date   ABDOMINAL HYSTERECTOMY     APPENDECTOMY     BALLOON DILATION N/A 04/21/2020   Procedure: BALLOON DILATION;  Surgeon: Milus Banister, MD;  Location: [facility];  Service: Endoscopy;  Laterality: N/A;  pyloric/ GI   BALLOON DILATION N/A 07/04/2020   Procedure: BALLOON DILATION;  Surgeon: Mauri Pole, MD;  Location: [facility];  Service: Endoscopy;  Laterality: N/A;   BIOPSY  04/21/2020   Procedure: BIOPSY;  Surgeon: Milus Banister, MD;  Location: [facility];  Service: Endoscopy;;   BIOPSY  07/04/2020   Procedure: BIOPSY;  Surgeon: Mauri Pole, MD;  Location: [facility];  Service: Endoscopy;;  EGD and COLON   BREAST SURGERY Right 1988   tumor removal   CHOLECYSTECTOMY N/A 05/18/2020   Procedure: LAPAROSCOPIC CHOLECYSTECTOMY;  Surgeon: Johnathan Hausen, MD;  Location: [facility];  Service: General;  Laterality: N/A;   COLONOSCOPY WITH PROPOFOL N/A 07/04/2020   Procedure: COLONOSCOPY WITH PROPOFOL;   Surgeon: Mauri Pole, MD;  Location: [facility];  Service: Endoscopy;  Laterality: N/A;   COLONOSCOPY WITH PROPOFOL N/A 11/03/2021   Procedure: COLONOSCOPY WITH PROPOFOL;  Surgeon: Lesly Rubenstein, MD;  Location: [facility];  Service: Endoscopy;  Laterality: N/A;   ESOPHAGEAL DILATION  08/11/2020   Procedure: PYLORIC DILATION;  Surgeon: Doran Stabler, MD;  Location: [facility];  Service: Gastroenterology;;   ESOPHAGOGASTRODUODENOSCOPY (EGD) WITH PROPOFOL N/A 04/21/2020   Procedure: ESOPHAGOGASTRODUODENOSCOPY (EGD) WITH PROPOFOL;  Surgeon: Milus Banister, MD;  Location: [facility];  Service: Endoscopy;  Laterality: N/A;   ESOPHAGOGASTRODUODENOSCOPY (EGD) WITH PROPOFOL N/A 07/04/2020   Procedure: ESOPHAGOGASTRODUODENOSCOPY (EGD) WITH PROPOFOL;  Surgeon: Mauri Pole, MD;  Location: [facility];  Service: Endoscopy;  Laterality: N/A;   ESOPHAGOGASTRODUODENOSCOPY (EGD) WITH PROPOFOL N/A 08/11/2020   Procedure: ESOPHAGOGASTRODUODENOSCOPY (EGD) WITH PROPOFOL;  Surgeon: Doran Stabler, MD;  Location: [facility];  Service: Gastroenterology;  Laterality: N/A;   ESOPHAGOGASTRODUODENOSCOPY (EGD) WITH PROPOFOL N/A 11/03/2021   Procedure: ESOPHAGOGASTRODUODENOSCOPY (EGD) WITH PROPOFOL;  Surgeon: Lesly Rubenstein, MD;  Location: [facility];  Service: Endoscopy;  Laterality: N/A;   ESOPHAGOGASTRODUODENOSCOPY (EGD) WITH PROPOFOL N/A 05/07/2022   Procedure: ESOPHAGOGASTRODUODENOSCOPY (EGD) WITH PROPOFOL;  Surgeon: Lin Landsman, MD;  Location: [facility];  Service: Gastroenterology;  Laterality: N/A;   KNEE SURGERY  2005   2 times left   KNEE SURGERY Right    KYPHOPLASTY N/A 07/20/2021   Procedure: T 10KYPHOPLASTY;  Surgeon: Hessie Knows, MD;  Location: [facility];  Service: Orthopedics;  Laterality: N/A;   KYPHOPLASTY N/A 08/23/2021   Procedure: T9 KYPHOPLASTY;  Surgeon: Hessie Knows, MD;  Location: [facility];  Service: Orthopedics;  Laterality: N/A;    POLYPECTOMY  07/04/2020   Procedure: POLYPECTOMY;  Surgeon: Mauri Pole, MD;  Location: [facility];  Service: Endoscopy;;   TUBAL LIGATION      Family History  Problem Relation Age of Onset   Breast cancer Mother 51   Ovarian cancer Mother 42   Brain cancer Mother    Hypertension Father    Hyperlipidemia Father    Heart disease Father    Colon cancer Father        diagnosed in his 49s   Breast cancer Maternal Grandmother 51   Diabetes Paternal Grandmother    Breast cancer Paternal Grandmother 39   Other Son        drug over dose   Colon cancer Maternal Grandfather        dx in his 53s   Liver disease Maternal Grandfather    Breast cancer Maternal Aunt 60   Breast cancer Maternal Aunt 60   Colon cancer Maternal Uncle    Stomach cancer Neg Hx    Pancreatic cancer Neg  Hx    Esophageal cancer Neg Hx     Social History   Socioeconomic History   Marital status: Single    Spouse name: Not on file   Number of children: 2   Years of education: Not on file   Highest education level: Not on file  Occupational History   Not on file  Tobacco Use   Smoking status: Former    Packs/day: 1.00    Years: 15.00    Total pack years: 15.00    Types: Cigarettes    Quit date: 09/12/2017    Years since quitting: 5.3   Smokeless tobacco: Never  Vaping Use   Vaping Use: Never used  Substance and Sexual Activity   Alcohol use: Not Currently   Drug use: No   Sexual activity: Not Currently  Other Topics Concern   Not on file  Social History Narrative   Lives with Grandson's great grandparents    [name] - 47 years old ([place])   Daughter - [name] - lives in the [place] system - family, aunt, [name] and [name] (who she lives with), brother   Transportation: roommates   Enjoy: [employer] channel, playing with grandson   Exercise: PT exercises   Diet: good - chicken, some steak, tries to eat healthy, avoids fried foods   Retired from being a Quarry manager   Social  Determinants of Radio broadcast assistant Strain: [place]  (10/11/2022)   Overall Financial Resource Strain (CARDIA)    Difficulty of Paying Living Expenses: Not hard at all  Food Insecurity: No Food Insecurity (10/11/2022)   Hunger Vital Sign    Worried About Running Out of Food in the Last Year: Never true    [place] in the Last Year: Never true  Transportation Needs: No Transportation Needs (10/11/2022)   PRAPARE - Hydrologist (Medical): No    Lack of Transportation (Non-Medical): No  Physical Activity: Inactive (10/11/2022)   Exercise Vital Sign    Days of Exercise per Week: 0 days    Minutes of Exercise per Session: 0 min  Stress: No Stress Concern Present (10/11/2022)   [place]    Feeling of Stress : Not at all  Social Connections: Moderately Integrated (10/11/2022)   Social Connection and Isolation Panel [NHANES]    Frequency of Communication with Friends and Family: More than three times a week    Frequency of Social Gatherings with Friends and Family: More than three times a week    Attends Religious Services: More than 4 times per year    Active Member of [employer] or Organizations: Yes    Attends Music therapist: More than 4 times per year    Marital Status: Divorced  Intimate Partner Violence: Not At Risk (10/11/2022)   Humiliation, Afraid, Rape, and Kick questionnaire    Fear of Current or Ex-Partner: No    Emotionally Abused: No    Physically Abused: No    Sexually Abused: No   Review of Systems Some nausea Appetite is off--but able to eat Lots of stress at home---"my nerves are shot"     Objective:   Physical Exam Constitutional:      Appearance: Normal appearance.  HENT:     Head:     Comments: Frontal and maxillary tenderness    Right Ear: Tympanic membrane and ear canal normal.     Left Ear: Tympanic membrane  and ear canal normal.      Nose: Congestion present.     Mouth/Throat:     Pharynx: No oropharyngeal exudate or posterior oropharyngeal erythema.  Pulmonary:     Effort: Pulmonary effort is normal.     Comments: Basilar crackles and rhonchi Skin:    Comments: Redness, warmth and tenderness medial right great toenail  Neurological:     Mental Status: She is alert.            Assessment & Plan:

## 2023-01-28 NOTE — Assessment & Plan Note (Signed)
CXR negative except for chronic changes Will treat with augmentin 875 bid for 7 days (refill just in case) No longer smokes

## 2023-01-28 NOTE — Assessment & Plan Note (Signed)
Discussed warm soaks--can use plain or with epsom salts Starting augmentin for sinus--this should help that also

## 2023-01-29 ENCOUNTER — Telehealth: Payer: Self-pay | Admitting: Family

## 2023-01-29 NOTE — Telephone Encounter (Signed)
Prescription Request  01/29/2023  Is this a "Controlled Substance" medicine? No  LOV: 10/17/2022  What is the name of the medication or equipment? hydrOXYzine (ATARAX) 25 MG tablet   Have you contacted your pharmacy to request a refill? Yes   Which pharmacy would you like this sent to?  [id number] [place] - [address] [place] [address]Phone: [phone]: [phone]    Patient notified that their request is being sent to the clinical staff for review and that they should receive a response within 2 business days.   Please advise at Mobile [phone](mobile)

## 2023-01-30 ENCOUNTER — Other Ambulatory Visit: Payer: Self-pay | Admitting: Family

## 2023-01-30 DIAGNOSIS — F411 Generalized anxiety disorder: Secondary | ICD-10-CM

## 2023-01-30 DIAGNOSIS — R112 Nausea with vomiting, unspecified: Secondary | ICD-10-CM

## 2023-01-30 DIAGNOSIS — F331 Major depressive disorder, recurrent, moderate: Secondary | ICD-10-CM

## 2023-01-30 MED ORDER — HYDROXYZINE HCL 25 MG PO TABS: 25.0000 mg | ORAL_TABLET | Freq: Two times a day (BID) | ORAL | 0 refills | Status: DC | PRN

## 2023-01-30 NOTE — Telephone Encounter (Signed)
Patient called in today regarding of the status of this medication refill. She stated that her anxiety and nerves are all over the place as of today,and would like to know if she will be able to have this rx filled?

## 2023-01-30 NOTE — Telephone Encounter (Signed)
Spoke with the patient and advised as instructed. Pt understood and agrees with an appoinment. Appt scheduled for 02/05/23.

## 2023-01-30 NOTE — Telephone Encounter (Signed)
Refilled however pt should not be taking this daily if so we might need to discuss further if an additional medication that is indicated for daily use would be better suited.

## 2023-02-05 ENCOUNTER — Ambulatory Visit (INDEPENDENT_AMBULATORY_CARE_PROVIDER_SITE_OTHER): Payer: Medicare HMO | Admitting: Family

## 2023-02-05 ENCOUNTER — Encounter: Payer: Self-pay | Admitting: Family

## 2023-02-05 VITALS — BP 130/80 | HR 63 | Temp 97.9°F | Ht 72.0 in | Wt 183.2 lb

## 2023-02-05 DIAGNOSIS — K529 Noninfective gastroenteritis and colitis, unspecified: Secondary | ICD-10-CM | POA: Diagnosis not present

## 2023-02-05 DIAGNOSIS — F4323 Adjustment disorder with mixed anxiety and depressed mood: Secondary | ICD-10-CM | POA: Insufficient documentation

## 2023-02-05 DIAGNOSIS — E538 Deficiency of other specified B group vitamins: Secondary | ICD-10-CM

## 2023-02-05 DIAGNOSIS — E876 Hypokalemia: Secondary | ICD-10-CM

## 2023-02-05 DIAGNOSIS — R69 Illness, unspecified: Secondary | ICD-10-CM | POA: Diagnosis not present

## 2023-02-05 DIAGNOSIS — R252 Cramp and spasm: Secondary | ICD-10-CM | POA: Diagnosis not present

## 2023-02-05 LAB — MAGNESIUM: Magnesium: 1.9 mg/dL (ref 1.5–2.5)

## 2023-02-05 LAB — COMPREHENSIVE METABOLIC PANEL
ALT: 8 U/L (ref 0–35)
AST: 14 U/L (ref 0–37)
Albumin: 3.7 g/dL (ref 3.5–5.2)
Alkaline Phosphatase: 101 U/L (ref 39–117)
BUN: 21 mg/dL (ref 6–23)
CO2: 27 mEq/L (ref 19–32)
Calcium: 9.4 mg/dL (ref 8.4–10.5)
Chloride: 102 mEq/L (ref 96–112)
Creatinine, Ser: 0.98 mg/dL (ref 0.40–1.20)
GFR: 58.84 mL/min — ABNORMAL LOW (ref >60.00)
Glucose, Bld: 86 mg/dL (ref 70–99)
Potassium: 4.4 mEq/L (ref 3.5–5.1)
Sodium: 137 mEq/L (ref 135–145)
Total Bilirubin: 0.3 mg/dL (ref 0.2–1.2)
Total Protein: 7 g/dL (ref 6.0–8.3)

## 2023-02-05 LAB — B12 AND FOLATE PANEL
Folate: 5.6 ng/mL — ABNORMAL LOW (ref >5.9)
Vitamin B-12: 844 pg/mL (ref 211–911)

## 2023-02-05 MED ORDER — ESCITALOPRAM OXALATE 10 MG PO TABS: 10.0000 mg | ORAL_TABLET | Freq: Every day | ORAL | 2 refills | Status: DC
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
## 2023-02-05 NOTE — Assessment & Plan Note (Signed)
Worsening  Reviewed gad 7 and phq9  Hydroxyxine 25 mg prn   I instructed pt to start lexapro 1/2 tablet once daily for 1 week and then increase to a full tablet once daily on week two as tolerated.  We discussed common side effects such as nausea, drowsiness and weight gain.  Also discussed rare but serious side effect of suicidal ideation.  She is instructed to discontinue medication and go directly to ED if this occurs.  Pt verbalizes understanding.  Plan is to follow up in 30 days to evaluate progress.

## 2023-02-05 NOTE — Assessment & Plan Note (Signed)
Repeat b12 pending results

## 2023-02-05 NOTE — Assessment & Plan Note (Signed)
Ordering magnesium and repeat potassium today pending results Recommendation for daily magnesium malate 300 mg

## 2023-02-05 NOTE — Patient Instructions (Addendum)
Recommend daily magnesium malate around 300 mg once daily   ------------------------------------  Start lexapro 10 mg for anxiety and depression. Take 1/2 tablet by mouth once daily for about one week, then increase to 1 full tablet thereafter.   Taking the medicine as directed and not missing any doses is one of the best things you can do to treat your anxiety/depression.  Here are some things to keep in mind:  Side effects (stomach upset, some increased anxiety) may happen before you notice a benefit.  These side effects typically go away over time. Changes to your dose of medicine or a change in medication all together is sometimes necessary Many people will notice an improvement within two weeks but the full effect of the medication can take up to 4-6 weeks Stopping the medication when you start feeling better often results in a return of symptoms. Most people need to be on medication at least 6-12 months If you start having thoughts of hurting yourself or others after starting this medicine, please call me immediately.    ------------------------------------   Regards,   Eugenia Pancoast FNP-C  ------------------------------------  Managing Anxiety, Adult After being diagnosed with anxiety, you may be relieved to know why you have felt or behaved a certain way. You may also feel overwhelmed about the treatment ahead and what it will mean for your life. With care and support, you can manage this condition. How to manage lifestyle changes Managing stress and anxiety  Stress is your body's reaction to life changes and events, both good and bad. Most stress will last just a few hours, but stress can be ongoing and can lead to more than just stress. Although stress can play a major role in anxiety, it is not the same as anxiety. Stress is usually caused by something external, such as a deadline, test, or competition. Stress normally passes after the triggering event has ended.  Anxiety  is caused by something internal, such as imagining a terrible outcome or worrying that something will go wrong that will devastate you. Anxiety often does not go away even after the triggering event is over, and it can become long-term (chronic) worry. It is important to understand the differences between stress and anxiety and to manage your stress effectively so that it does not lead to an anxious response. Talk with your health care provider or a counselor to learn more about reducing anxiety and stress. He or she may suggest tension reduction techniques, such as: Music therapy. Spend time creating or listening to music that you enjoy and that inspires you. Mindfulness-based meditation. Practice being aware of your normal breaths while not trying to control your breathing. It can be done while sitting or walking. Centering prayer. This involves focusing on a word, phrase, or sacred image that means something to you and brings you peace. Deep breathing. To do this, expand your stomach and inhale slowly through your nose. Hold your breath for 3-5 seconds. Then exhale slowly, letting your stomach muscles relax. Self-talk. Learn to notice and identify thought patterns that lead to anxiety reactions and change those patterns to thoughts that feel peaceful. Muscle relaxation. Taking time to tense muscles and then relax them. Choose a tension reduction technique that fits your lifestyle and personality. These techniques take time and practice. Set aside 5-15 minutes a day to do them. Therapists can offer counseling and training in these techniques. The training to help with anxiety may be covered by some insurance plans. Other things you can  do to manage stress and anxiety include: Keeping a stress diary. This can help you learn what triggers your reaction and then learn ways to manage your response. Thinking about how you react to certain situations. You may not be able to control everything, but you can  control your response. Making time for activities that help you relax and not feeling guilty about spending your time in this way. Doing visual imagery. This involves imagining or creating mental pictures to help you relax. Practicing yoga. Through yoga poses, you can lower tension and promote relaxation.  Medicines Medicines can help ease symptoms. Medicines for anxiety include: Antidepressant medicines. These are usually prescribed for long-term daily control. Anti-anxiety medicines. These may be added in severe cases, especially when panic attacks occur. Medicines will be prescribed by a health care provider. When used together, medicines, psychotherapy, and tension reduction techniques may be the most effective treatment. Relationships Relationships can play a big part in helping you recover. Try to spend more time connecting with trusted friends and family members. Consider going to couples counseling if you have a partner, taking family education classes, or going to family therapy. Therapy can help you and others better understand your condition. How to recognize changes in your anxiety Everyone responds differently to treatment for anxiety. Recovery from anxiety happens when symptoms decrease and stop interfering with your daily activities at home or work. This may mean that you will start to: Have better concentration and focus. Worry will interfere less in your daily thinking. Sleep better. Be less irritable. Have more energy. Have improved memory. It is also important to recognize when your condition is getting worse. Contact your health care provider if your symptoms interfere with home or work and you feel like your condition is not improving. Follow these instructions at home: Activity Exercise. Adults should do the following: Exercise for at least 150 minutes each week. The exercise should increase your heart rate and make you sweat (moderate-intensity  exercise). Strengthening exercises at least twice a week. Get the right amount and quality of sleep. Most adults need 7-9 hours of sleep each night. Lifestyle  Eat a healthy diet that includes plenty of vegetables, fruits, whole grains, low-fat dairy products, and lean protein. Do not eat a lot of foods that are high in fats, added sugars, or salt (sodium). Make choices that simplify your life. Do not use any products that contain nicotine or tobacco. These products include cigarettes, chewing tobacco, and vaping devices, such as e-cigarettes. If you need help quitting, ask your health care provider. Avoid caffeine, alcohol, and certain over-the-counter cold medicines. These may make you feel worse. Ask your pharmacist which medicines to avoid. General instructions Take over-the-counter and prescription medicines only as told by your health care provider. Keep all follow-up visits. This is important. Where to find support You can get help and support from these sources: Self-help groups. Online and [employer]. A trusted spiritual leader. Couples counseling. Family education classes. Family therapy. Where to find more information You may find that joining a support group helps you deal with your anxiety. The following sources can help you locate counselors or support groups near you: [place]: www.mentalhealthamerica.net Anxiety and Depression Association of [place] (ADAA): https://www.clark.net/ National Alliance on Mental Illness (NAMI): www.nami.org Contact a health care provider if: You have a hard time staying focused or finishing daily tasks. You spend many hours a day feeling worried about everyday life. You become exhausted by worry. You start to have headaches  or frequently feel tense. You develop chronic nausea or diarrhea. Get help right away if: You have a racing heart and shortness of breath. You have thoughts of hurting yourself or others. If you ever  feel like you may hurt yourself or others, or have thoughts about taking your own life, get help right away. Go to your nearest emergency department or: Call your local emergency services (911 in the [place] Call a suicide crisis helpline, such as the [place] at 727-284-5339 or 988 in the [place]. This is open 24 hours a day in the [place] Text the Crisis Text Line at 743-454-2077 (in the [place].). Summary Taking steps to learn and use tension reduction techniques can help calm you and help prevent triggering an anxiety reaction. When used together, medicines, psychotherapy, and tension reduction techniques may be the most effective treatment. Family, friends, and partners can play a big part in supporting you. This information is not intended to replace advice given to you by your health care provider. Make sure you discuss any questions you have with your health care provider. Document Revised: 06/28/2021 Document Reviewed: 03/26/2021 Elsevier Patient Education  [place].

## 2023-02-06 ENCOUNTER — Telehealth: Payer: Self-pay | Admitting: *Deleted

## 2023-02-06 ENCOUNTER — Other Ambulatory Visit: Payer: Self-pay | Admitting: Family

## 2023-02-06 DIAGNOSIS — D520 Dietary folate deficiency anemia: Secondary | ICD-10-CM

## 2023-02-06 MED ORDER — FOLIC ACID 1 MG PO TABS: 1.0000 mg | ORAL_TABLET | Freq: Every day | ORAL | 1 refills | Status: DC

## 2023-02-06 NOTE — Progress Notes (Signed)
Kidney function slight decrease, make sure drinking water/fluids throughout the day. Goal 4-6 glasses 8 oz of water daily.will send in two months worth to boost back up.  B12 has greatly improved, folic acid a bit low will send in RX.

## 2023-02-06 NOTE — Progress Notes (Signed)
  Care Coordination   Note   02/06/2023 Name: LEYRE SCHLANGER MRN: [id number] DOB: [birth year]  CLYDA LUDY is a 72 y.o. year old female who sees Eugenia Pancoast, FNP for primary care. I reached out to Valaria Good by phone today to offer care coordination services.  Ms. Anzaldua was given information about Care Coordination services today including:   The Care Coordination services include support from the care team which includes your Nurse Coordinator, Clinical Social Worker, or Pharmacist.  The Care Coordination team is here to help remove barriers to the health concerns and goals most important to you. Care Coordination services are voluntary, and the patient may decline or stop services at any time by request to their care team member.   Care Coordination Consent Status: Patient agreed to services and verbal consent obtained.   Follow up plan:  Telephone appointment with care coordination team member scheduled for:  02/11/23  Encounter Outcome:  Pt. Scheduled   [place]  Direct Dial: [phone]

## 2023-02-11 ENCOUNTER — Telehealth: Payer: Self-pay | Admitting: *Deleted

## 2023-02-11 ENCOUNTER — Encounter: Payer: Self-pay | Admitting: *Deleted

## 2023-02-11 NOTE — Patient Outreach (Signed)
  Care Coordination   02/11/2023 Name: Morgan [name] MRN: [id number] DOB: 12-May-1951   Care Coordination Outreach Attempts:  An unsuccessful telephone outreach was attempted for a scheduled appointment today.  Follow Up Plan:  Additional outreach attempts will be made to offer the patient care coordination information and services.   Encounter Outcome:  No Answer   Care Coordination Interventions:  No, not indicated    Daquann Merriott, [place] Worker  [facility] Care Management [phone]

## 2023-02-15 ENCOUNTER — Telehealth: Payer: Self-pay | Admitting: *Deleted

## 2023-02-15 NOTE — Progress Notes (Unsigned)
  Care Coordination Note  02/15/2023 Name: [name] MRN: [id number] DOB: October 24, 1951  Morgan [name] is a 72 y.o. year old female who is a primary care patient of Eugenia Pancoast, [place] and is actively engaged with the care management team. I reached out to Valaria Good by phone today to assist with re-scheduling an initial visit with the Licensed Clinical Social Worker  Follow up plan: Unsuccessful telephone outreach attempt made. A HIPAA compliant phone message was left for the patient providing contact information and requesting a return call.   Julian Hy, [place] Direct Dial: [phone]

## 2023-02-18 NOTE — Progress Notes (Signed)
  Care Coordination Note  02/18/2023 Name: Morgan [name] MRN: [id number] DOB: [birth year]  Morgan [name] is a 72 y.o. year old female who is a primary care patient of Eugenia Pancoast, [place] and is actively engaged with the care management team. I reached out to Valaria Good by phone today to assist with re-scheduling an initial visit with the Licensed Clinical Social Worker  Follow up plan: We have been unable to make contact with the patient for follow up.    Julian Hy, [place] Direct Dial: [phone]

## 2023-02-20 ENCOUNTER — Ambulatory Visit: Payer: Medicare HMO | Admitting: Pulmonary Disease

## 2023-02-20 ENCOUNTER — Encounter: Payer: Self-pay | Admitting: Pulmonary Disease

## 2023-02-20 VITALS — BP 120/68 | HR 76 | Temp 98.7°F | Wt 180.0 lb

## 2023-02-20 DIAGNOSIS — J849 Interstitial pulmonary disease, unspecified: Secondary | ICD-10-CM | POA: Diagnosis not present

## 2023-02-20 MED ORDER — ALBUTEROL SULFATE HFA 108 (90 BASE) MCG/ACT IN AERS: 2.0000 | INHALATION_SPRAY | Freq: Four times a day (QID) | RESPIRATORY_TRACT | 11 refills | Status: AC | PRN

## 2023-02-20 MED ORDER — BREZTRI AEROSPHERE 160-9-4.8 MCG/ACT IN AERO: 2.0000 | INHALATION_SPRAY | Freq: Two times a day (BID) | RESPIRATORY_TRACT | 11 refills | Status: AC

## 2023-02-20 NOTE — Patient Instructions (Signed)
I refilled the albuterol and Breztri  Repeat CT scan August 2024, follow up with me after  Return in 6 months or sooner as needed

## 2023-02-21 NOTE — Progress Notes (Signed)
$'@Patient'I$  ID: Valaria Good, female    DOB: Feb 12, 1951, 72 y.o.   MRN: [id number]  Chief Complaint  Patient presents with   Follow-up    Follow up for chronic resp failure. Night time oxygen. Pt states that her breathing is doing ok today and that she thinks the pollen is causing her to have SOB and issues catching her breath. Albuterol as needed     Referring provider: Eugenia Pancoast, FNP  HPI:   72 y.o. woman whom we are seeing in follow up for evaluation of chronic cough/bronchitis.    Returns for routine follow-up.  On Breztri.  Previous had been off.  Symptoms largely stable.  Overall doing okay.  Worsening pounds over last couple days causing some symptoms.  Hopefully be temporary.  HPI at initial visit: Patient reports onset of symptoms about 2 months ago.  Started with fever, runny nose, sore throat.  The symptoms resolved but cough persisted.  Productive.  Initially yellow to clear phlegm.  Worsened over the last months to more green phlegm.  Associate with worsening dyspnea on exertion.  No times a day with things are better or worse.  No position to make things better or worse.  She thinks the pollens have made nasal congestion and some cough worse given chronic issues with pollens and nasal/sinus congestion with seasonal changes in the past.  Has been using Advair without much improvement.  Was given a course of prednisone and Augmentin about 1 month ago.  This did help for period of time but symptoms have recurred and back to as bad as they were prior.  Was placed on prednisone and levofloxacin yesterday via PCP.  Review chest x-ray 03/14/2022 that on my review and interpretation reveals hyperinflation with what appears to be chronic bronchial scarring/inflammation.  Review chest x-ray 04/12/2022 that on my interpretation is unchanged from earlier x-ray in March.  PMH: Tobacco abuse in remission, seasonal allergies, hypertension Surgical history: Hysterectomy, appendectomy,  esophageal dilation, knee surgery, cholecystectomy Family history: Mother with history of breast cancer, ovarian cancer, father with hypertension, hyperlipidemia, CAD, colon cancer in 52s Social history: Former smoker, Printmaker, quit 2015, retired Futures trader / Pulmonary Flowsheets:   ACT:      No data to display           MMRC:     No data to display           Epworth:      No data to display           Tests:   FENO:  No results found for: "NITRICOXIDE"  PFT:    Latest Ref Rng & Units 07/16/2022    8:57 AM  PFT Results  FVC-Pre L 2.07   FVC-Predicted Pre % 52   FVC-Post L 2.24   FVC-Predicted Post % 56   Pre FEV1/FVC % % 82   Post FEV1/FCV % % 82   FEV1-Pre L 1.69   FEV1-Predicted Pre % 56   FEV1-Post L 1.84   DLCO uncorrected ml/min/mmHg 13.18   DLCO UNC% % 53   DLCO corrected ml/min/mmHg 14.95   DLCO COR %Predicted % 61   DLVA Predicted % 98   TLC L 4.66   TLC % Predicted % 76   RV % Predicted % 92   Personally reviewed interpreted as spirometry just above moderate restriction versus air trapping.  No bronchodilator spots.  TLC confirms mild restriction.  DLCO moderately reduced.  WALK:  No data to display           Imaging: Personally reviewed and as per EMR discussion this note DG Chest 2 View  Result Date: 01/28/2023 CLINICAL DATA:  Cough, SOB since 01/24/23. EXAM: CHEST - 2 VIEW COMPARISON:  Chest radiographs 12/11/2022 FINDINGS: Normal cardiomediastinal contours. Redemonstrated diffuse bilateral coarse interstitial markings and chronic bilateral apical pleuroparenchymal thickening. Chronic scarring at the left lung base. No new focal consolidation. No pneumothorax or pleural effusion. Lungs are hyperinflated. Cement vertebral augmentation at 2 thoracic spinal levels. No acute finding in the visualized skeleton. IMPRESSION: No acute cardiopulmonary finding.  COPD. Electronically Signed   By: Audie Pinto M.D.   On:  01/28/2023 16:46    Lab Results: Personally reviewed CBC    Component Value Date/Time   WBC 8.1 12/24/2022 0854   RBC 3.50 (L) 12/24/2022 0854   RBC 3.62 (L) 12/24/2022 0854   HGB 10.2 (L) 12/24/2022 0854   HGB 12.4 09/18/2012 2000   HCT 31.3 (L) 12/24/2022 0854   HCT 37.3 09/18/2012 2000   PLT 332 12/24/2022 0854   PLT 450 (H) 09/18/2012 2000   MCV 86.5 12/24/2022 0854   MCV 86 09/18/2012 2000   MCH 28.2 12/24/2022 0854   MCHC 32.6 12/24/2022 0854   RDW 14.4 12/24/2022 0854   RDW 14.5 09/18/2012 2000   LYMPHSABS 1.7 12/24/2022 0854   MONOABS 0.3 12/24/2022 0854   EOSABS 0.1 12/24/2022 0854   BASOSABS 0.0 12/24/2022 0854    BMET    Component Value Date/Time   NA 137 02/05/2023 1022   NA 143 12/26/2018 0000   NA 139 09/18/2012 2000   K 4.4 02/05/2023 1022   K 3.3 (L) 09/18/2012 2000   CL 102 02/05/2023 1022   CL 99 09/18/2012 2000   CO2 27 02/05/2023 1022   CO2 33 (H) 09/18/2012 2000   GLUCOSE 86 02/05/2023 1022   GLUCOSE 111 (H) 09/18/2012 2000   BUN 21 02/05/2023 1022   BUN 16 12/26/2018 0000   BUN 13 09/18/2012 2000   CREATININE 0.98 02/05/2023 1022   CREATININE 0.72 09/18/2012 2000   CALCIUM 9.4 02/05/2023 1022   CALCIUM 9.4 09/18/2012 2000   GFRNONAA >60 09/23/2022 1105   GFRNONAA >60 09/18/2012 2000   GFRAA >60 09/02/2020 1658   GFRAA >60 09/18/2012 2000    BNP    Component Value Date/Time   BNP 77.7 08/08/2022 1113    ProBNP    Component Value Date/Time   PROBNP 180.0 (H) 08/29/2021 0931    Specialty Problems       Pulmonary Problems   COPD (chronic obstructive pulmonary disease) (HCC)    Formatting of this note might be different from the original. Last Assessment & Plan:  Formatting of this note might be different from the original. Good air movement, no wheezes. Endorses sob with activity and fatigue but low suspicion for COPD exacerbation. Suspect is related to pain and post-hospital fatigue.      Oropharyngeal dysphagia   DOE  (dyspnea on exertion)    Allergies  Allergen Reactions   Meperidine Hives and Anaphylaxis   Strawberry Extract Hives and Swelling   Sucralfate Nausea Only and Other (See Comments)    Severe nausea Other reaction(s): Other (See Comments) Severe nausea Severe nausea   Wasp Venom Hives    Immunization History  Administered Date(s) Administered   Fluad Quad(high Dose 65+) 09/30/2019, 08/29/2020, 08/28/2021   Influenza, High Dose Seasonal PF 08/23/2018   Influenza,inj,Quad PF,6+ Mos 02/24/2015  Moderna Sars-Covid-2 Vaccination 03/18/2020, 04/15/2020   Pneumococcal Conjugate-13 11/12/2018   Pneumococcal Polysaccharide-23 02/24/2015    Past Medical History:  Diagnosis Date   Allergy    Anemia    Anxiety    Arthritis    CHF (congestive heart failure) (HCC)    COPD (chronic obstructive pulmonary disease) (HCC)    Depression    GERD (gastroesophageal reflux disease)    Hypertension    MVP (mitral valve prolapse)    Parkinson disease     Tobacco History: Social History   Tobacco Use  Smoking Status Former   Packs/day: 1.00   Years: 15.00   Total pack years: 15.00   Types: Cigarettes   Quit date: 09/12/2017   Years since quitting: 5.4  Smokeless Tobacco Never   Counseling given: Not Answered   Continue to not smoke  Outpatient Encounter Medications as of 02/20/2023  Medication Sig   acetaminophen (TYLENOL) 325 MG tablet Take 2 tablets (650 mg total) by mouth every 4 (four) hours as needed for mild pain (or temp > 37.5 C (99.5 F)).   Budeson-Glycopyrrol-Formoterol (BREZTRI AEROSPHERE) 160-9-4.8 MCG/ACT AERO Inhale 2 puffs into the lungs in the morning and at bedtime.   carbidopa-levodopa (SINEMET IR) 25-100 MG tablet Take 2 tablets by mouth 3 (three) times daily.   cyanocobalamin (VITAMIN B12) 100 MCG tablet Take 100 mcg by mouth daily.   escitalopram (LEXAPRO) 10 MG tablet Take 1 tablet (10 mg total) by mouth daily.   folic acid (FOLVITE) 1 MG tablet Take 1 tablet  (1 mg total) by mouth daily.   hydrOXYzine (ATARAX) 25 MG tablet Take 1 tablet (25 mg total) by mouth 2 (two) times daily as needed for anxiety or nausea.   ibuprofen (ADVIL) 400 MG tablet Take 1 tablet (400 mg total) by mouth every 8 (eight) hours as needed for headache or moderate pain.   OXYGEN Inhale 3 L/min into the lungs See admin instructions. 3 L/min at bedtime and as needed for shortness of breath throughout the day   pantoprazole (PROTONIX) 20 MG tablet Take by mouth.   potassium chloride SA (KLOR-CON M) 20 MEQ tablet Take 1 tablet (20 mEq total) by mouth daily.   promethazine (PHENERGAN) 12.5 MG tablet Take 1 tablet (12.5 mg total) by mouth every 12 (twelve) hours as needed for nausea or vomiting.   topiramate (TOPAMAX) 50 MG tablet Take 1 tablet (50 mg total) by mouth at bedtime.   Vitamin D, Ergocalciferol, (DRISDOL) 1.25 MG (50000 UNIT) CAPS capsule Take 50,000 Units by mouth once a week.   [DISCONTINUED] albuterol (VENTOLIN HFA) 108 (90 Base) MCG/ACT inhaler Inhale into the lungs.   albuterol (VENTOLIN HFA) 108 (90 Base) MCG/ACT inhaler Inhale 2 puffs into the lungs every 6 (six) hours as needed for wheezing or shortness of breath.   No facility-administered encounter medications on file as of 02/20/2023.     Review of Systems  Review of Systems  N/a Physical Exam  BP 120/68 (BP Location: Left Arm, Patient Position: Sitting, Cuff Size: Normal)   Pulse 76   Temp 98.7 F (37.1 C) (Oral)   Wt 180 lb (81.6 kg)   SpO2 99%   BMI 24.41 kg/m   Wt Readings from Last 5 Encounters:  02/20/23 180 lb (81.6 kg)  02/05/23 183 lb 3.2 oz (83.1 kg)  01/28/23 182 lb (82.6 kg)  12/26/22 188 lb 6.4 oz (85.5 kg)  12/11/22 185 lb (83.9 kg)    BMI Readings from Last 5 Encounters:  02/20/23 24.41 kg/m  02/05/23 24.85 kg/m  01/28/23 24.68 kg/m  12/26/22 25.55 kg/m  12/11/22 25.09 kg/m     Physical Exam General: Sitting in chair, no acute distress Eyes: EOMI, icterus Neck:  Supple, no JVP Pulmonary: Distant, clear Abdomen: Nondistended, bowel sounds present CV: Regular rate and rhythm, no murmur MSK: No synovitis, joint effusion Neuro: Normal gait, no weakness Psych: Normal mood, full affect   Assessment & Plan:    Dyspnea on exertion: Overall stable.  Now back on Breztri and albuterol.  PFTs without fixed obstruction or bronchodilator response.  Did show some signs of restriction with reduced DLCO.  High-res CT scan 07/2022 reveals mild upper lobe interlobular septal thickening, otherwise clear.  Pattern not clear but not consistent with UIP.  NSIP versus HP versus postinfectious scarring.  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  Now some during the day.  Encouraged her to check her oxygen saturation to maintain 88%.  Notably no clear reason on recent high-res CT scan of the chest for hypoxemia in regards to lung parenchyma.  Mild interstitial findings in the upper lobes do not account for this.  Abnormal CT scan: Mild interstitial changes upper lobes.  Not UIP.  NSIP versus HSP?  Versus possible postinfectious scarring.  Plan repeat follow-up scan in 1 year, 07/2023 or sooner as needed based on symptoms.  Repeat scan ordered.   Return in about 6 months (around 08/23/2023).   Lanier Clam, MD 02/21/2023

## 2023-02-25 DIAGNOSIS — I509 Heart failure, unspecified: Secondary | ICD-10-CM | POA: Diagnosis not present

## 2023-02-25 DIAGNOSIS — J449 Chronic obstructive pulmonary disease, unspecified: Secondary | ICD-10-CM | POA: Diagnosis not present

## 2023-02-28 ENCOUNTER — Other Ambulatory Visit: Payer: Self-pay | Admitting: Family

## 2023-02-28 DIAGNOSIS — D520 Dietary folate deficiency anemia: Secondary | ICD-10-CM

## 2023-02-28 DIAGNOSIS — F4323 Adjustment disorder with mixed anxiety and depressed mood: Secondary | ICD-10-CM

## 2023-03-20 ENCOUNTER — Ambulatory Visit (HOSPITAL_COMMUNITY)
Admission: EM | Admit: 2023-03-20 | Discharge: 2023-03-20 | Disposition: A | Discharge status: Home / Self Care | Payer: Medicare HMO | Attending: Internal Medicine | Admitting: Internal Medicine

## 2023-03-20 ENCOUNTER — Ambulatory Visit (INDEPENDENT_AMBULATORY_CARE_PROVIDER_SITE_OTHER): Payer: Medicare HMO

## 2023-03-20 ENCOUNTER — Encounter (HOSPITAL_COMMUNITY): Payer: Self-pay

## 2023-03-20 DIAGNOSIS — W19XXXA Unspecified fall, initial encounter: Secondary | ICD-10-CM

## 2023-03-20 DIAGNOSIS — M25561 Pain in right knee: Secondary | ICD-10-CM | POA: Diagnosis not present

## 2023-03-20 DIAGNOSIS — S8001XA Contusion of right knee, initial encounter: Secondary | ICD-10-CM | POA: Diagnosis not present

## 2023-03-20 DIAGNOSIS — M1711 Unilateral primary osteoarthritis, right knee: Secondary | ICD-10-CM | POA: Diagnosis not present

## 2023-03-20 MED ORDER — MELOXICAM 15 MG PO TABS: 15.0000 mg | ORAL_TABLET | Freq: Every day | ORAL | 0 refills | Status: DC

## 2023-03-20 MED ORDER — METHYLPREDNISOLONE ACETATE 40 MG/ML IJ SUSP
INTRAMUSCULAR | Status: AC
  Filled 2023-03-20: qty 1

## 2023-03-20 MED ORDER — METHYLPREDNISOLONE ACETATE 40 MG/ML IJ SUSP
40.0000 mg | Freq: Once | INTRAMUSCULAR | Status: AC
  Administered 2023-03-20: 40 mg via INTRA_ARTICULAR

## 2023-03-20 MED ORDER — LIDOCAINE HCL (PF) 1 % IJ SOLN
INTRAMUSCULAR | Status: AC
  Filled 2023-03-20: qty 30

## 2023-03-20 NOTE — ED Triage Notes (Addendum)
Patient states she tripped while walking down a concrete driveway yesterday and landed on her right knee. Patient has pain and swelling to the right knee. Patient denies hitting her head.  Patient states she had Tylenol 975 mg at 0600 today.

## 2023-03-20 NOTE — ED Provider Notes (Signed)
[facility]   [id number] 03/20/23 Arrival Time: [id number]  ASSESSMENT & PLAN:  X-rays of knee negative for fracture but notable for mild tricompartmental osteoarthritis  1. Contusion of right knee, initial encounter   2. Primary osteoarthritis of right knee    -Patient suffered a mechanical ground-level fall and right knee yesterday.  X-rays negative for fracture.  She does have degenerative changes, most notably posterior to the patella.  Given her pain today, after intra-articular corticosteroid injection she like to proceed with that.  Also place her in a knee brace for support and send her in a prescription for meloxicam for longitudinal arthritis pain.  I encouraged her to follow-up with her orthopedist Dr. Theda Sers to discuss management of her knee moving forward.  All questions answered and she agrees to plan  Meds ordered this encounter  Medications   methylPREDNISolone acetate (DEPO-MEDROL) injection 40 mg   meloxicam (MOBIC) 15 MG tablet    Sig: Take 1 tablet (15 mg total) by mouth daily.    Dispense:  30 tablet    Refill:  0   Discharge Instructions   None     Procedure performed:  Right Knee Intraarticular Corticosteroid Injection; palpation guided  Consent obtained and verified. Time-out conducted. Noted no overlying erythema, induration, or other signs of local infection. The right anterior lateral joint space was palpated and marked. The overlying skin was prepped in a sterile fashion. Topical analgesic spray: Ethyl chloride. Needle: 22 gauge, 1.5 inch Meds: 40 mg methylrprednisolone, 4 ml 1% lidocaine without epinephrine Completed without difficulty  Advised to call if fevers/chills, erythema, induration, drainage, or persistent bleeding.     Reviewed expectations re: course of current medical issues. Questions answered. Outlined signs and symptoms indicating need for more acute intervention. Patient verbalized understanding. After Visit Summary  given.   SUBJECTIVE: Pleasant 72 year old lady with history of bilateral meniscus surgeries comes urgent care for evaluation of right knee pain.  She suffered a mechanical ground-level fall onto her right knee yesterday.  Her pain is located mostly at the medial aspect of the knee.  She has noticed some mild soft tissue swelling there.  She has been able to bear weight but is still pretty sore there.  Denies any mechanical catching or locking of the knees.  Denies any numbness or tingling down the leg.  She has not taken any medicines for this.  No LMP recorded. Patient has had a hysterectomy. Past Surgical History:  Procedure Laterality Date   ABDOMINAL HYSTERECTOMY     APPENDECTOMY     BALLOON DILATION N/A 04/21/2020   Procedure: BALLOON DILATION;  Surgeon: Milus Banister, MD;  Location: [facility];  Service: Endoscopy;  Laterality: N/A;  pyloric/ GI   BALLOON DILATION N/A 07/04/2020   Procedure: BALLOON DILATION;  Surgeon: Mauri Pole, MD;  Location: [facility];  Service: Endoscopy;  Laterality: N/A;   BIOPSY  04/21/2020   Procedure: BIOPSY;  Surgeon: Milus Banister, MD;  Location: [facility];  Service: Endoscopy;;   BIOPSY  07/04/2020   Procedure: BIOPSY;  Surgeon: Mauri Pole, MD;  Location: [facility];  Service: Endoscopy;;  EGD and COLON   BREAST SURGERY Right 1988   tumor removal   CHOLECYSTECTOMY N/A 05/18/2020   Procedure: LAPAROSCOPIC CHOLECYSTECTOMY;  Surgeon: Johnathan Hausen, MD;  Location: [facility];  Service: General;  Laterality: N/A;   COLONOSCOPY WITH PROPOFOL N/A 07/04/2020   Procedure: COLONOSCOPY WITH PROPOFOL;  Surgeon: Mauri Pole, MD;  Location: [facility];  Service: Endoscopy;  Laterality: N/A;   COLONOSCOPY WITH PROPOFOL N/A 11/03/2021   Procedure: COLONOSCOPY WITH PROPOFOL;  Surgeon: Lesly Rubenstein, MD;  Location: [facility];  Service: Endoscopy;  Laterality: N/A;   ESOPHAGEAL DILATION  08/11/2020   Procedure: PYLORIC DILATION;   Surgeon: Doran Stabler, MD;  Location: [facility];  Service: Gastroenterology;;   ESOPHAGOGASTRODUODENOSCOPY (EGD) WITH PROPOFOL N/A 04/21/2020   Procedure: ESOPHAGOGASTRODUODENOSCOPY (EGD) WITH PROPOFOL;  Surgeon: Milus Banister, MD;  Location: [facility];  Service: Endoscopy;  Laterality: N/A;   ESOPHAGOGASTRODUODENOSCOPY (EGD) WITH PROPOFOL N/A 07/04/2020   Procedure: ESOPHAGOGASTRODUODENOSCOPY (EGD) WITH PROPOFOL;  Surgeon: Mauri Pole, MD;  Location: [facility];  Service: Endoscopy;  Laterality: N/A;   ESOPHAGOGASTRODUODENOSCOPY (EGD) WITH PROPOFOL N/A 08/11/2020   Procedure: ESOPHAGOGASTRODUODENOSCOPY (EGD) WITH PROPOFOL;  Surgeon: Doran Stabler, MD;  Location: [facility];  Service: Gastroenterology;  Laterality: N/A;   ESOPHAGOGASTRODUODENOSCOPY (EGD) WITH PROPOFOL N/A 11/03/2021   Procedure: ESOPHAGOGASTRODUODENOSCOPY (EGD) WITH PROPOFOL;  Surgeon: Lesly Rubenstein, MD;  Location: [facility];  Service: Endoscopy;  Laterality: N/A;   ESOPHAGOGASTRODUODENOSCOPY (EGD) WITH PROPOFOL N/A 05/07/2022   Procedure: ESOPHAGOGASTRODUODENOSCOPY (EGD) WITH PROPOFOL;  Surgeon: Lin Landsman, MD;  Location: [facility];  Service: Gastroenterology;  Laterality: N/A;   KNEE SURGERY  2005   2 times left   KNEE SURGERY Right    KYPHOPLASTY N/A 07/20/2021   Procedure: T 10KYPHOPLASTY;  Surgeon: Hessie Knows, MD;  Location: [facility];  Service: Orthopedics;  Laterality: N/A;   KYPHOPLASTY N/A 08/23/2021   Procedure: T9 KYPHOPLASTY;  Surgeon: Hessie Knows, MD;  Location: [facility];  Service: Orthopedics;  Laterality: N/A;   POLYPECTOMY  07/04/2020   Procedure: POLYPECTOMY;  Surgeon: Mauri Pole, MD;  Location: [facility];  Service: Endoscopy;;   TUBAL LIGATION       OBJECTIVE:  Vitals:   03/20/23 1055  BP: (!) 157/92  Pulse: 73  Resp: 18  Temp: 98.4 F (36.9 C)  TempSrc: Oral  SpO2: 98%     Physical Exam Vitals reviewed.  Cardiovascular:     Rate  and Rhythm: Normal rate.  Pulmonary:     Effort: Pulmonary effort is normal.  Musculoskeletal:     Comments: Right knee -mild soft tissue swelling.  No obvious deformity.  Tender to palpation at the medial joint line.  Nontender palpation lateral joint line.  Range of motion is from 5 to 120 degrees.  5/5 strength with resisted knee extension.  No laxity with valgus or varus stressing.  Firm endpoint on [place].      Labs: Results for orders placed or performed in visit on 02/05/23  Comprehensive metabolic panel  Result Value Ref Range   Sodium 137 135 - 145 mEq/L   Potassium 4.4 3.5 - 5.1 mEq/L   Chloride 102 96 - 112 mEq/L   CO2 27 19 - 32 mEq/L   Glucose, Bld 86 70 - 99 mg/dL   BUN 21 6 - 23 mg/dL   Creatinine, Ser 0.98 0.40 - 1.20 mg/dL   Total Bilirubin 0.3 0.2 - 1.2 mg/dL   Alkaline Phosphatase 101 39 - 117 U/L   AST 14 0 - 37 U/L   ALT 8 0 - 35 U/L   Total Protein 7.0 6.0 - 8.3 g/dL   Albumin 3.7 3.5 - 5.2 g/dL   GFR 58.84 (L) >60.00 mL/min   Calcium 9.4 8.4 - 10.5 mg/dL  Magnesium  Result Value Ref Range   Magnesium 1.9 1.5 - 2.5  mg/dL  B12 and Folate Panel  Result Value Ref Range   Vitamin B-12 844 211 - 911 pg/mL   Folate 5.6 (L) >5.9 ng/mL   Labs Reviewed - No data to display  Imaging: DG Knee Complete 4 Views Right  Result Date: 03/20/2023 CLINICAL DATA:  Right knee pain since falling yesterday. EXAM: RIGHT KNEE - COMPLETE 4+ VIEW COMPARISON:  Radiographs 03/26/2021 FINDINGS: The bones appear mildly demineralized. There is no evidence of acute fracture or dislocation. There are stable tricompartmental degenerative changes which are most advanced in the medial compartment. No evidence of knee joint effusion or foreign body. IMPRESSION: No evidence of acute fracture or dislocation. Stable tricompartmental degenerative changes. Electronically Signed   By: Richardean Sale M.D.   On: 03/20/2023 11:22     Allergies  Allergen Reactions   Meperidine Hives and  Anaphylaxis   Strawberry Extract Hives and Swelling   Sucralfate Nausea Only and Other (See Comments)    Severe nausea Other reaction(s): Other (See Comments) Severe nausea Severe nausea   Wasp Venom Hives                                               Past Medical History:  Diagnosis Date   Allergy    Anemia    Anxiety    Arthritis    CHF (congestive heart failure)    COPD (chronic obstructive pulmonary disease)    Depression    GERD (gastroesophageal reflux disease)    Hypertension    MVP (mitral valve prolapse)    Parkinson disease     Social History   Socioeconomic History   Marital status: Single    Spouse name: Not on file   Number of children: 2   Years of education: Not on file   Highest education level: Not on file  Occupational History   Not on file  Tobacco Use   Smoking status: Former    Packs/day: 1.00    Years: 15.00    Additional pack years: 0.00    Total pack years: 15.00    Types: Cigarettes    Quit date: 09/12/2017    Years since quitting: 5.5   Smokeless tobacco: Never  Vaping Use   Vaping Use: Never used  Substance and Sexual Activity   Alcohol use: Not Currently   Drug use: No   Sexual activity: Not Currently  Other Topics Concern   Not on file  Social History Narrative   Lives with Grandson's great grandparents    [name] - 83 years old ([place])   Daughter - [name] - lives in the [place] system - family, aunt, [name] and [name] (who she lives with), brother   Transportation: roommates   Enjoy: [employer] channel, playing with grandson   Exercise: PT exercises   Diet: good - chicken, some steak, tries to eat healthy, avoids fried foods   Retired from being a Quarry manager   Social Determinants of Radio broadcast assistant Strain: [place]  (10/11/2022)   Overall Financial Resource Strain (CARDIA)    Difficulty of Paying Living Expenses: Not hard at all  Food Insecurity: No Food Insecurity (10/11/2022)   Hunger Vital Sign     Worried About Running Out of Food in the Last Year: Never true    Ran Out of Food in the Last Year: Never true  Transportation  Needs: No Transportation Needs (10/11/2022)   PRAPARE - Hydrologist (Medical): No    Lack of Transportation (Non-Medical): No  Physical Activity: Inactive (10/11/2022)   Exercise Vital Sign    Days of Exercise per Week: 0 days    Minutes of Exercise per Session: 0 min  Stress: No Stress Concern Present (10/11/2022)   Clara    Feeling of Stress : Not at all  Social Connections: Moderately Integrated (10/11/2022)   Social Connection and Isolation Panel [NHANES]    Frequency of Communication with Friends and Family: More than three times a week    Frequency of Social Gatherings with Friends and Family: More than three times a week    Attends Religious Services: More than 4 times per year    Active Member of Clubs or Organizations: Yes    Attends Archivist Meetings: More than 4 times per year    Marital Status: Divorced  Intimate Partner Violence: Not At Risk (10/11/2022)   Humiliation, Afraid, Rape, and Kick questionnaire    Fear of Current or Ex-Partner: No    Emotionally Abused: No    Physically Abused: No    Sexually Abused: No    Family History  Problem Relation Age of Onset   Breast cancer Mother 39   Ovarian cancer Mother 9   Brain cancer Mother    Hypertension Father    Hyperlipidemia Father    Heart disease Father    Colon cancer Father        diagnosed in his 81s   Breast cancer Maternal Grandmother 94   Diabetes Paternal Grandmother    Breast cancer Paternal Grandmother 1   Other Son        drug over dose   Colon cancer Maternal Grandfather        dx in his 75s   Liver disease Maternal Grandfather    Breast cancer Maternal Aunt 60   Breast cancer Maternal Aunt 60   Colon cancer Maternal Uncle    Stomach cancer Neg Hx     Pancreatic cancer Neg Hx    Esophageal cancer Neg Hx       Wanda Rideout, Dorian Pod, MD 03/20/23 1234

## 2023-03-24 IMAGING — MR MR HEAD W/O CM
12 series · 45 of 48 positions shown · non-contrast
Comparison: Prior head CT examinations 07/01/2021 and earlier. MRI
brain and MRA head 08/23/2018.

CLINICAL DATA: Provided history: Primary parkinsonism. Additional
history provided by scanning technologist: Patient reports recently
diagnosed Parkinson's disease, tremor.

EXAM:
MRI HEAD WITHOUT CONTRAST
TECHNIQUE: Multiplanar, multiecho pulse sequences of the brain and surrounding
structures were obtained without intravenous contrast.

[Series 5: ax dwi_tracew · axial · 3.0mm · 0.65mm/px · z∈[-60,+94]mm · 7 of 96 slices shown]
[im 1/96]
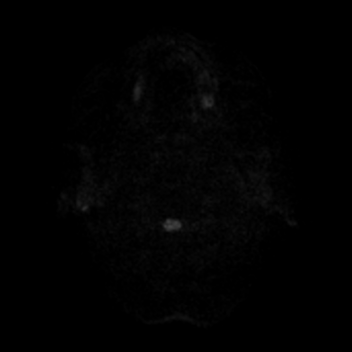
[im 16/96]
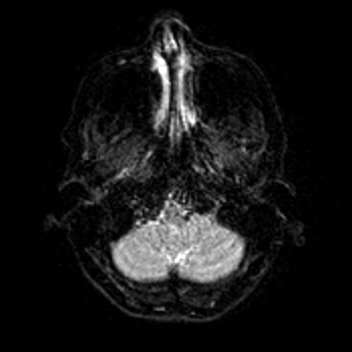
[im 32/96]
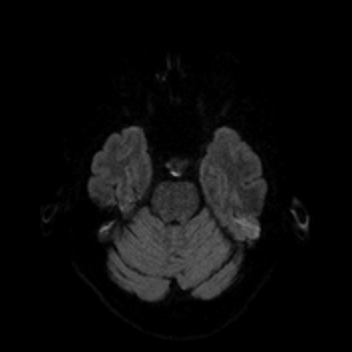
[im 48/96]
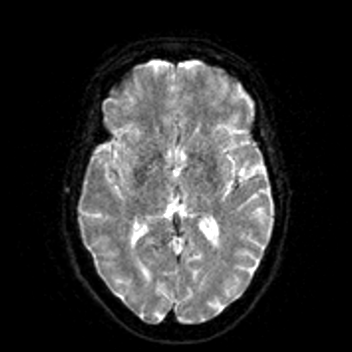
[im 64/96]
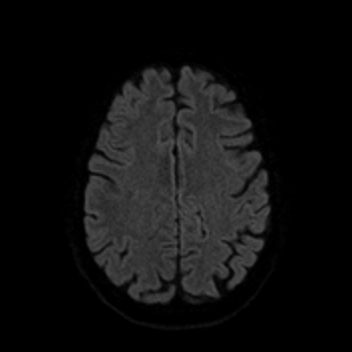
[im 80/96]
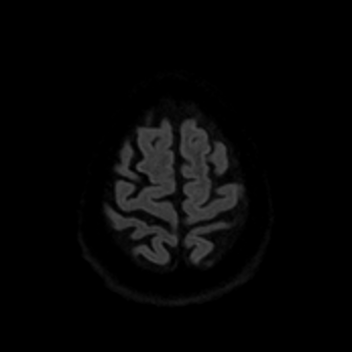
[im 96/96]
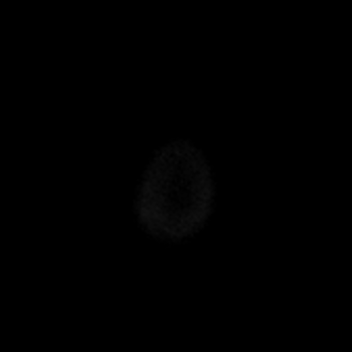

[Series 6: ax dwi_adc · axial · 3.0mm · 0.65mm/px · z∈[-60,+91]mm · 4 of 47 slices shown]
[im 1/47]
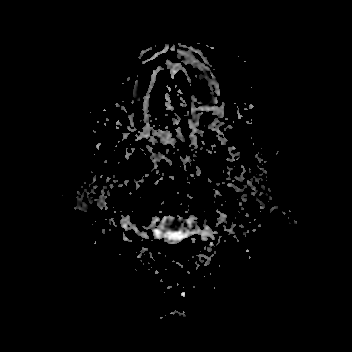
[im 16/47]
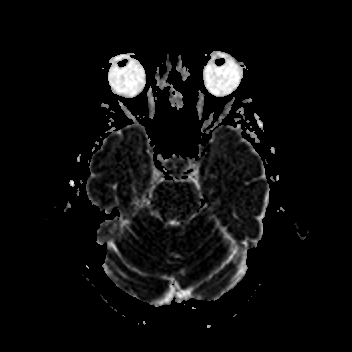
[im 31/47]
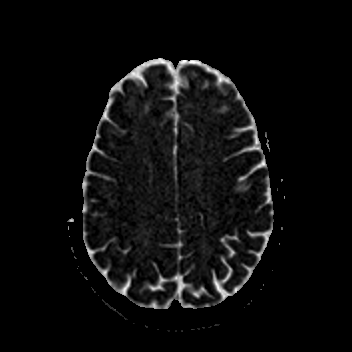
[im 47/47]
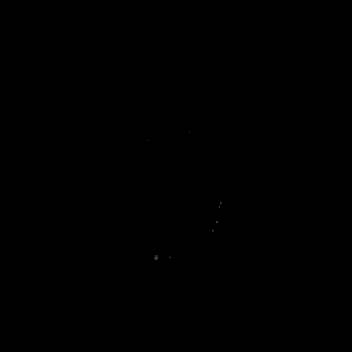

[Series 7: cor dwi_tracew · coronal · 5.0mm · 0.68mm/px · 5 of 80 slices shown]
[im 1/80]
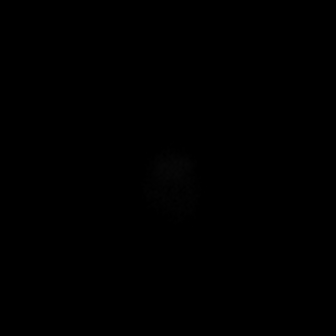
[im 20/80]
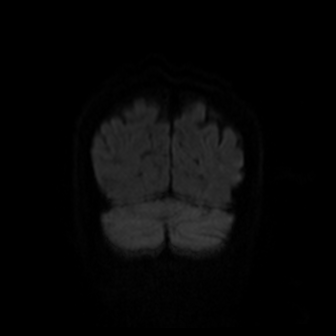
[im 40/80]
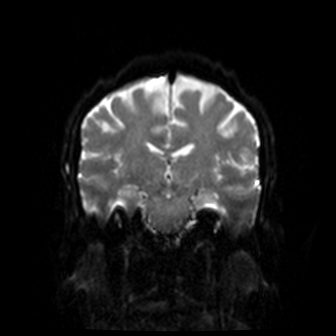
[im 60/80]
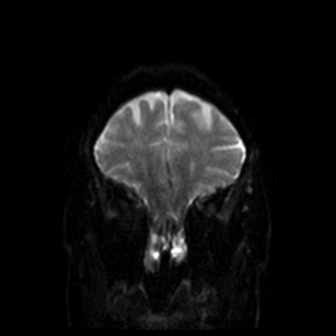
[im 80/80]
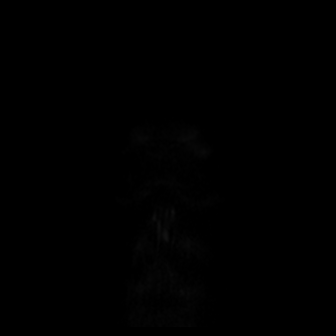

[Series 8: cor dwi_adc · coronal · 5.0mm · 0.68mm/px · 3 of 40 slices shown]
[im 1/40]
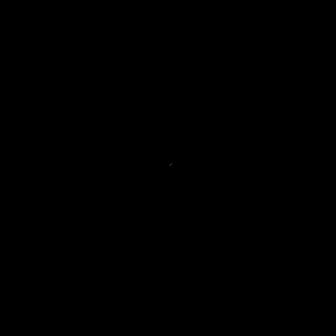
[im 20/40]
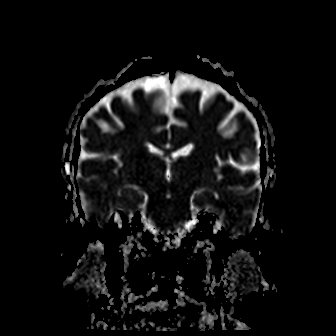
[im 40/40]
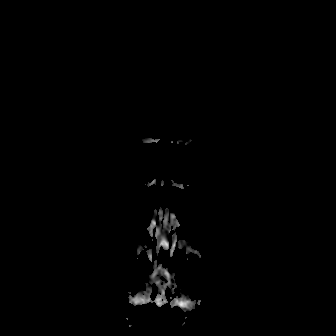

[Series 9: T1 · sagittal · 5.0mm · 0.62mm/px · 1 of 21 slices shown (1 of 2)]
[im 1/21]
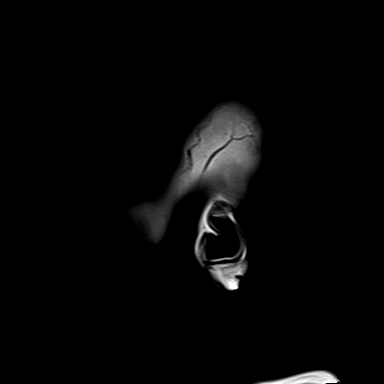

[Series 10: T2 · axial · 5.0mm · 0.53mm/px · z∈[-60,+95]mm · 2 of 27 slices shown (1 of 2)]
[im 1/27]
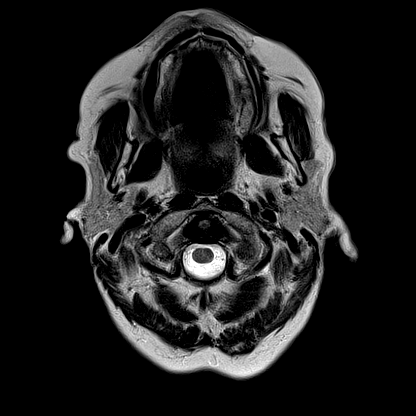
[im 27/27]
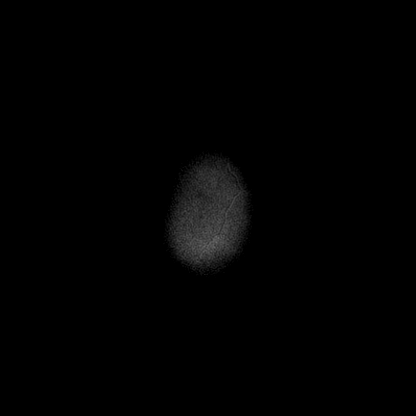

[Series 11: mag_images · axial · 3.0mm · 0.90mm/px · z∈[-59,+94]mm · 3 of 52 slices shown]
[im 1/52]
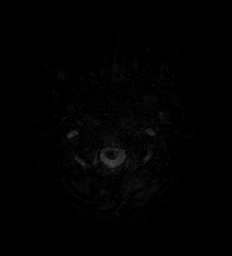
[im 26/52]
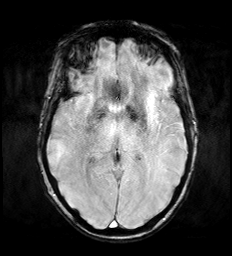
[im 52/52]
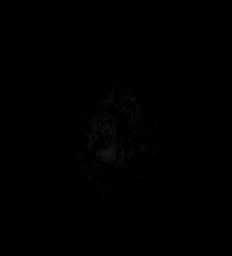

[Series 12: pha_images · axial · 3.0mm · 0.90mm/px · z∈[-59,+94]mm · 3 of 52 slices shown]
[im 1/52]
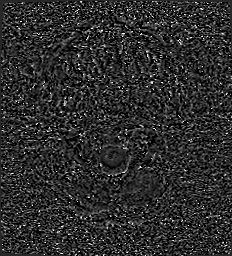
[im 26/52]
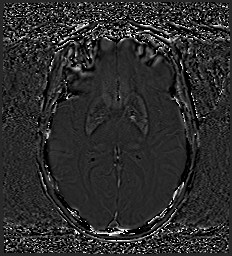
[im 52/52]
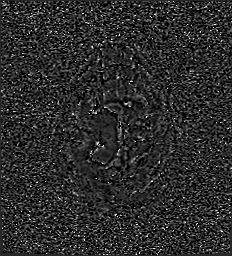

[Series 13: swi_images · axial · 3.0mm · 0.90mm/px · z∈[-59,+94]mm · 3 of 52 slices shown]
[im 1/52]
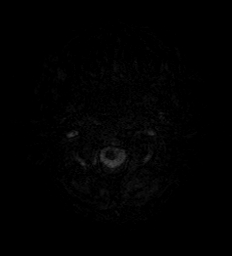
[im 26/52]
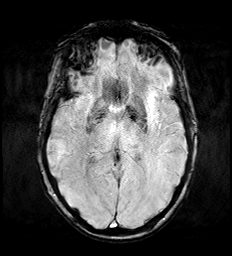
[im 52/52]
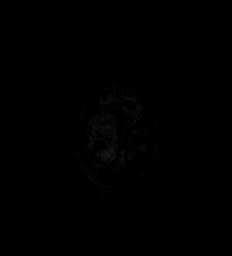

[Series 15: FLAIR · axial · 3.0mm · 0.53mm/px · z∈[-63,+98]mm · 4 of 55 slices shown]
[im 1/55]
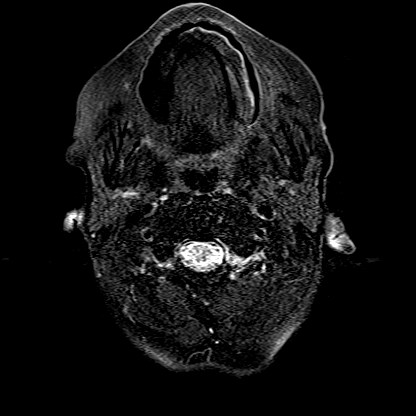
[im 19/55]
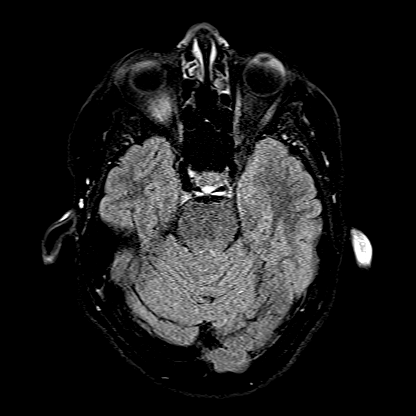
[im 37/55]
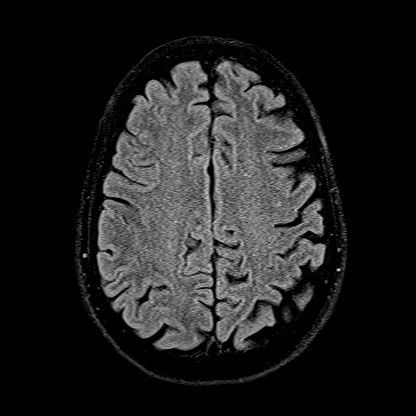
[im 55/55]
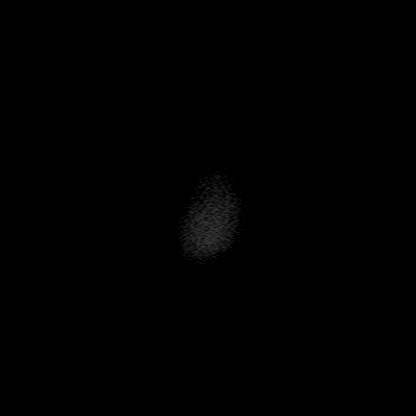

[Series 16: T1 · axial · 1.0mm · 0.98mm/px · z∈[-66,+107]mm · 8 of 174 slices shown (2 of 2)]
[im 1/174]
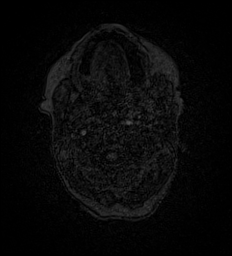
[im 35/174]
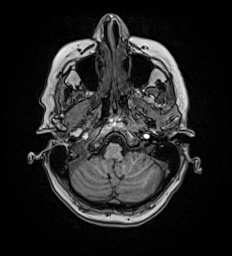
[im 52/174]
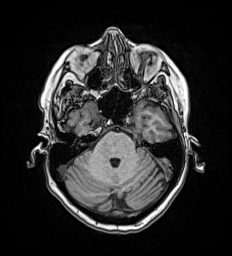
[im 70/174]
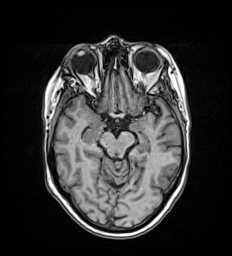
[im 104/174]
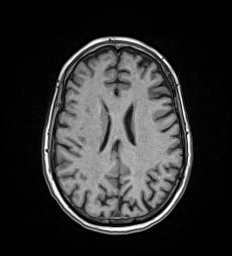
[im 122/174]
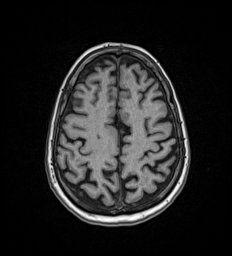
[im 139/174]
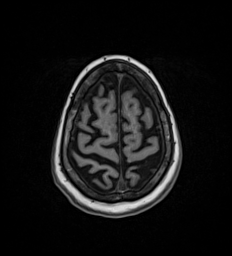
[im 174/174]
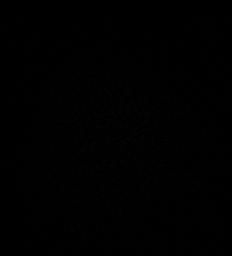

[Series 17: T2 · coronal · 5.0mm · 0.57mm/px · 2 of 31 slices shown (2 of 2)]
[im 1/31]
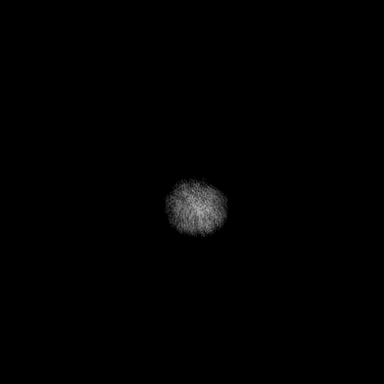
[im 31/31]
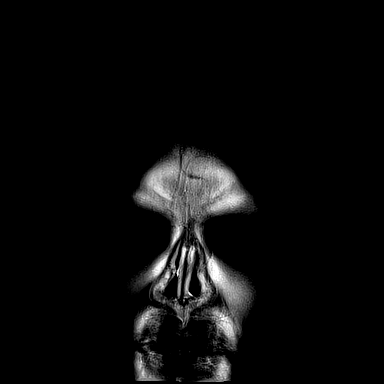

[45 of 48 positions shown; findings below may reference images not displayed]

FINDINGS: Brain:

Cerebral volume is unremarkable for age.

Mild multifocal T2 FLAIR hyperintense signal abnormality within the
cerebral white matter, nonspecific but compatible with chronic small
vessel ischemic disease.

There is no acute infarct.

No evidence of an intracranial mass.

No chronic intracranial blood products.

No extra-axial fluid collection.

No midline shift.

Vascular: Maintained flow voids within the proximal large arterial
vessels.

Skull and upper cervical spine: No focal suspicious marrow lesion.

Sinuses/Orbits: Visualized orbits show no acute finding. Small
mucous retention cyst within the left maxillary sinus. Trace mucosal
thickening within the bilateral ethmoid air cells.
IMPRESSION: No evidence of acute intracranial abnormality.

Mild cerebral white matter chronic small vessel ischemic disease,
not significantly changed from the brain MRI of 08/23/2018.

Otherwise unremarkable non-contrast MRI appearance of the brain for
age.

Mild paranasal sinus disease, as described.

## 2023-03-28 DIAGNOSIS — Z86010 Personal history of colonic polyps: Secondary | ICD-10-CM | POA: Diagnosis not present

## 2023-03-28 DIAGNOSIS — K591 Functional diarrhea: Secondary | ICD-10-CM | POA: Diagnosis not present

## 2023-03-28 DIAGNOSIS — J449 Chronic obstructive pulmonary disease, unspecified: Secondary | ICD-10-CM | POA: Diagnosis not present

## 2023-03-28 DIAGNOSIS — I509 Heart failure, unspecified: Secondary | ICD-10-CM | POA: Diagnosis not present

## 2023-03-28 DIAGNOSIS — R197 Diarrhea, unspecified: Secondary | ICD-10-CM | POA: Diagnosis not present

## 2023-03-28 DIAGNOSIS — K580 Irritable bowel syndrome with diarrhea: Secondary | ICD-10-CM | POA: Diagnosis not present

## 2023-04-02 ENCOUNTER — Telehealth: Payer: Self-pay | Admitting: Family

## 2023-04-02 NOTE — Telephone Encounter (Signed)
FYI: This call has been transferred to Access Nurse. Once the result note has been entered staff can address the message at that time.  Patient called in with the following symptoms:  Red Word:elevated blood pressure, passed out twice    Please advise at [facility] [phone]  Message is routed to Provider Pool and [facility] Triage

## 2023-04-02 NOTE — Telephone Encounter (Signed)
Unable to reach pt by phone; per access nurse note pt did agree to go to ED. Sending note to Hayden Pedro FNP and [place] pool.

## 2023-04-03 ENCOUNTER — Telehealth: Payer: Self-pay | Admitting: Family

## 2023-04-03 NOTE — Telephone Encounter (Signed)
FYI, patient is scheduled for tomorrow 04/04/23

## 2023-04-03 NOTE — Telephone Encounter (Signed)
Patient went to [facility], MD saw a place on her chest that he believes may be a carcinoma, Gastro MD believes it needs to be looked at by Dermatology  Patient wants to know who we would recommend for referral  Made patient an appointment for Thursday 4.18 @ 10am so Tabitha can look at it

## 2023-04-03 NOTE — Telephone Encounter (Signed)
Agree with recommendations, glad to see she has went to ER

## 2023-04-04 ENCOUNTER — Encounter: Payer: Self-pay | Admitting: Family

## 2023-04-04 ENCOUNTER — Ambulatory Visit (INDEPENDENT_AMBULATORY_CARE_PROVIDER_SITE_OTHER): Payer: Medicare HMO | Admitting: Family

## 2023-04-04 VITALS — BP 138/92 | HR 75 | Temp 97.9°F | Ht 72.0 in | Wt 184.6 lb

## 2023-04-04 DIAGNOSIS — R4020 Unspecified coma: Secondary | ICD-10-CM | POA: Insufficient documentation

## 2023-04-04 DIAGNOSIS — R69 Illness, unspecified: Secondary | ICD-10-CM | POA: Diagnosis not present

## 2023-04-04 DIAGNOSIS — R0789 Other chest pain: Secondary | ICD-10-CM | POA: Diagnosis not present

## 2023-04-04 DIAGNOSIS — F4323 Adjustment disorder with mixed anxiety and depressed mood: Secondary | ICD-10-CM

## 2023-04-04 DIAGNOSIS — I10 Essential (primary) hypertension: Secondary | ICD-10-CM | POA: Diagnosis not present

## 2023-04-04 DIAGNOSIS — L989 Disorder of the skin and subcutaneous tissue, unspecified: Secondary | ICD-10-CM | POA: Insufficient documentation

## 2023-04-04 DIAGNOSIS — M542 Cervicalgia: Secondary | ICD-10-CM

## 2023-04-04 LAB — CBC WITH DIFFERENTIAL/PLATELET
Basophils Absolute: 0 10*3/uL (ref 0.0–0.1)
Basophils Relative: 0.2 % (ref 0.0–3.0)
Eosinophils Absolute: 0.1 10*3/uL (ref 0.0–0.7)
Eosinophils Relative: 1.7 % (ref 0.0–5.0)
HCT: 33.7 % — ABNORMAL LOW (ref 36.0–46.0)
Hemoglobin: 11.2 g/dL — ABNORMAL LOW (ref 12.0–15.0)
Lymphocytes Relative: 30.8 % (ref 12.0–46.0)
Lymphs Abs: 2.3 10*3/uL (ref 0.7–4.0)
MCHC: 33.2 g/dL (ref 30.0–36.0)
MCV: 86.7 fl (ref 78.0–100.0)
Monocytes Absolute: 0.3 10*3/uL (ref 0.1–1.0)
Monocytes Relative: 4.1 % (ref 3.0–12.0)
Neutro Abs: 4.7 10*3/uL (ref 1.4–7.7)
Neutrophils Relative %: 63.2 % (ref 43.0–77.0)
Platelets: 202 10*3/uL (ref 150.0–400.0)
RBC: 3.89 Mil/uL (ref 3.87–5.11)
RDW: 14.1 % (ref 11.5–15.5)
WBC: 7.4 10*3/uL (ref 4.0–10.5)

## 2023-04-04 LAB — COMPREHENSIVE METABOLIC PANEL
ALT: 6 U/L (ref 0–35)
AST: 11 U/L (ref 0–37)
Albumin: 4 g/dL (ref 3.5–5.2)
Alkaline Phosphatase: 90 U/L (ref 39–117)
BUN: 19 mg/dL (ref 6–23)
CO2: 24 mEq/L (ref 19–32)
Calcium: 8.9 mg/dL (ref 8.4–10.5)
Chloride: 105 mEq/L (ref 96–112)
Creatinine, Ser: 1.11 mg/dL (ref 0.40–1.20)
GFR: 50.62 mL/min — ABNORMAL LOW (ref >60.00)
Glucose, Bld: 98 mg/dL (ref 70–99)
Potassium: 4.3 mEq/L (ref 3.5–5.1)
Sodium: 138 mEq/L (ref 135–145)
Total Bilirubin: 0.3 mg/dL (ref 0.2–1.2)
Total Protein: 6.5 g/dL (ref 6.0–8.3)

## 2023-04-04 MED ORDER — ESCITALOPRAM OXALATE 20 MG PO TABS: 20.0000 mg | ORAL_TABLET | Freq: Every day | ORAL | 3 refills | Status: DC

## 2023-04-04 MED ORDER — VALSARTAN 80 MG PO TABS: 80.0000 mg | ORAL_TABLET | Freq: Every day | ORAL | 3 refills | Status: DC

## 2023-04-04 NOTE — Assessment & Plan Note (Signed)
Suspected BCC  Referral placed for dermatology for eval/treat

## 2023-04-04 NOTE — Telephone Encounter (Signed)
Spoke with the patient and she is aware. She will call back in a week or two if she hasn't heard from anyone regarding the [place] appt.

## 2023-04-04 NOTE — Assessment & Plan Note (Signed)
Worsening  Recommend therapist consult  Increase lexapro to 20 mg once daily

## 2023-04-04 NOTE — Progress Notes (Signed)
Established Patient Office Visit  Subjective:      CC:  Chief Complaint  Patient presents with   Referral    Dermatology for spot on chest area.    HPI: Morgan [name] is a 72 y.o. female presenting on 04/04/2023 for Referral (Dermatology for spot on chest area.) . Elevated blood pressure without dx of htn: regularly at home now at 150/90   She does state the other night she passed out on the hardwood floor and she hurt her front area of her body which is sore today. She states she has been getting so aggravated to the point that she has passed out, she has passed out twice in the last one week. She states she feels really mad the time and will pass right out. She recently got her grandson back home, and there have been increased moments of stress to the point that she feels it causes her to pass out. Se does feel chest pain prior to passing out. After she comes to she throws up   Anxiety, pt on lexapro 10 mg once daily as well as hydroxyxine prn.   New complaints: Lesion on her check that is scaly and itching off, suspected BCC that was seen initially by GI.  She does not have a dermatologist.   Does also note some left sided neck pain at times by her carotid      Social history:  Relevant past medical, surgical, family and social history reviewed and updated as indicated. Interim medical history since our last visit reviewed.  Allergies and medications reviewed and updated.  DATA REVIEWED: CHART IN EPIC     ROS: Negative unless specifically indicated above in HPI.    Current Outpatient Medications:    acetaminophen (TYLENOL) 325 MG tablet, Take 2 tablets (650 mg total) by mouth every 4 (four) hours as needed for mild pain (or temp > 37.5 C (99.5 F))., Disp: , Rfl:    albuterol (VENTOLIN HFA) 108 (90 Base) MCG/ACT inhaler, Inhale 2 puffs into the lungs every 6 (six) hours as needed for wheezing or shortness of breath., Disp: 1 each, Rfl: 11    Budeson-Glycopyrrol-Formoterol (BREZTRI AEROSPHERE) 160-9-4.8 MCG/ACT AERO, Inhale 2 puffs into the lungs in the morning and at bedtime., Disp: 1 each, Rfl: 11   carbidopa-levodopa (SINEMET IR) 25-100 MG tablet, Take 2 tablets by mouth 3 (three) times daily., Disp: , Rfl:    cyanocobalamin (VITAMIN B12) 100 MCG tablet, Take 100 mcg by mouth daily., Disp: , Rfl:    escitalopram (LEXAPRO) 20 MG tablet, Take 1 tablet (20 mg total) by mouth daily., Disp: 90 tablet, Rfl: 3   folic acid (FOLVITE) 1 MG tablet, TAKE 1 TABLET BY MOUTH EVERY DAY, Disp: 90 tablet, Rfl: 1   ibuprofen (ADVIL) 400 MG tablet, Take 1 tablet (400 mg total) by mouth every 8 (eight) hours as needed for headache or moderate pain., Disp: 20 tablet, Rfl: 0   meloxicam (MOBIC) 15 MG tablet, Take 1 tablet (15 mg total) by mouth daily., Disp: 30 tablet, Rfl: 0   OXYGEN, Inhale 3 L/min into the lungs See admin instructions. 3 L/min at bedtime and as needed for shortness of breath throughout the day, Disp: , Rfl:    pantoprazole (PROTONIX) 20 MG tablet, Take by mouth., Disp: , Rfl:    potassium chloride SA (KLOR-CON M) 20 MEQ tablet, Take 1 tablet (20 mEq total) by mouth daily., Disp: 90 tablet, Rfl: 1   promethazine (PHENERGAN) 12.5 MG tablet,  Take 1 tablet (12.5 mg total) by mouth every 12 (twelve) hours as needed for nausea or vomiting., Disp: 20 tablet, Rfl: 0   topiramate (TOPAMAX) 50 MG tablet, Take 1 tablet (50 mg total) by mouth at bedtime., Disp: 90 tablet, Rfl: 0   valsartan (DIOVAN) 80 MG tablet, Take 1 tablet (80 mg total) by mouth daily., Disp: 90 tablet, Rfl: 3   Vitamin D, Ergocalciferol, (DRISDOL) 1.25 MG (50000 UNIT) CAPS capsule, Take 50,000 Units by mouth once a week., Disp: , Rfl:       Objective:    BP (!) 138/92   Pulse 75   Temp 97.9 F (36.6 C) (Temporal)   Ht 6' (1.829 m)   Wt 184 lb 9.6 oz (83.7 kg)   SpO2 98%   BMI 25.04 kg/m   Wt Readings from Last 3 Encounters:  04/04/23 184 lb 9.6 oz (83.7 kg)   02/20/23 180 lb (81.6 kg)  02/05/23 183 lb 3.2 oz (83.1 kg)    Physical Exam Constitutional:      General: She is not in acute distress.    Appearance: Normal appearance. She is normal weight. She is not ill-appearing, toxic-appearing or diaphoretic.  HENT:     Head: Normocephalic.  Cardiovascular:     Rate and Rhythm: Normal rate and regular rhythm.     Heart sounds: No murmur heard.    Comments: No carotid bruits auscultated Pulmonary:     Effort: Pulmonary effort is normal.  Musculoskeletal:        General: Normal range of motion.     Right lower leg: No edema.     Left lower leg: No edema.  Skin:    Comments: Red scaly annular lesion central chest  Neurological:     General: No focal deficit present.     Mental Status: She is alert and oriented to person, place, and time. Mental status is at baseline.  Psychiatric:        Mood and Affect: Mood normal.        Behavior: Behavior normal.        Thought Content: Thought content normal.        Judgment: Judgment normal.            Assessment & Plan:  Other chest pain -     EKG 12-Lead -     CBC with Differential/Platelet -     Comprehensive metabolic panel -     US Carotid Bilateral; Future  Skin lesion Assessment & Plan: Suspected BCC  Referral placed for dermatology for eval/treat    Orders: -     Ambulatory referral to Dermatology  LOC (loss of consciousness) Assessment & Plan: Recent onset, prior episode 8/23 EKG in office NSR  Cbc cmp ordered today Did advise pt to consult with her cardiologist and neurologist for evaluation  Advised if this occurs again to call 911 Pt to remain hydrated, will treat blood pressure.    Orders: -     CBC with Differential/Platelet -     Comprehensive metabolic panel -     Ambulatory referral to Dermatology -     US Carotid Bilateral; Future  Adjustment reaction with anxiety and depression Assessment & Plan: Worsening  Recommend therapist consult  Increase  lexapro to 20 mg once daily   Orders: -     Escitalopram Oxalate; Take 1 tablet (20 mg total) by mouth daily.  Dispense: 90 tablet; Refill: 3  Essential hypertension Assessment & Plan: Worsening Restart valsartan  80 mg once daily   Orders: -     Valsartan; Take 1 tablet (80 mg total) by mouth daily.  Dispense: 90 tablet; Refill: 3 -     US Carotid Bilateral; Future  Neck pain on left side -     US Carotid Bilateral; Future     Return in about 6 weeks (around 05/16/2023) for f/u anxiety, f/u blood pressure.  Mort Sawyers, MSN, APRN, FNP-C [facility]

## 2023-04-04 NOTE — Assessment & Plan Note (Signed)
Worsening Restart valsartan 80 mg once daily

## 2023-04-04 NOTE — Telephone Encounter (Signed)
Noted Saw pt today 4/18 see note

## 2023-04-04 NOTE — Patient Instructions (Addendum)
  I want you to call your neurologist and cardiologist to get evaluated appropriately for this new occurrence.  Stop by the lab prior to leaving today. I will notify you of your results once received.    Regards,   Mort Sawyers FNP-C

## 2023-04-04 NOTE — Assessment & Plan Note (Addendum)
Recent onset, prior episode 8/23 EKG in office NSR  Cbc cmp ordered today Did advise pt to consult with her cardiologist and neurologist for evaluation  Advised if this occurs again to call 911 Pt to remain hydrated, will treat blood pressure.

## 2023-04-04 NOTE — Telephone Encounter (Signed)
-----   Message from Mort Sawyers, [place] sent at 04/04/2023 12:14 PM EDT ----- Please call pt and let her know I did decide to order a carotid ultrasound because of her neck pain to assess further.   They should call her to schedule in the next 1-2 weeks if they do not let me know. I also did refer her to a dermatologist as well

## 2023-04-05 ENCOUNTER — Other Ambulatory Visit: Payer: Self-pay | Admitting: Family

## 2023-04-05 DIAGNOSIS — R3915 Urgency of urination: Secondary | ICD-10-CM

## 2023-04-05 NOTE — Progress Notes (Signed)
Can we have pt bring in urine sample especially with urinary urgency we will r/o UTI. Order placed.

## 2023-04-05 NOTE — Progress Notes (Signed)
Anemia has improved.  Kidney function slight decreased, is pt drinking a good amount of water?  Is she having any urinary symptoms?

## 2023-04-08 ENCOUNTER — Other Ambulatory Visit: Payer: Medicare HMO

## 2023-04-08 ENCOUNTER — Ambulatory Visit: Payer: Medicare HMO | Admitting: Dermatology

## 2023-04-11 LAB — URINALYSIS W MICROSCOPIC + REFLEX CULTURE

## 2023-04-11 LAB — EXTRA URINE SPECIMEN

## 2023-04-11 NOTE — Progress Notes (Signed)
Can we make sure they do the urinalysis with culture reflex  with this urine specimen? Not sure why they didn't.

## 2023-04-12 NOTE — Progress Notes (Signed)
Yes please

## 2023-04-16 ENCOUNTER — Telehealth: Payer: Self-pay

## 2023-04-16 ENCOUNTER — Other Ambulatory Visit: Payer: Self-pay | Admitting: Radiology

## 2023-04-16 DIAGNOSIS — R300 Dysuria: Secondary | ICD-10-CM

## 2023-04-16 NOTE — Telephone Encounter (Signed)
Urine was taken to the lab to be processed.

## 2023-04-16 NOTE — Telephone Encounter (Signed)
Patient came by to drop off a urine sample. Please place a future lab for urinalysis with culture reflex. Thanks

## 2023-04-16 NOTE — Telephone Encounter (Signed)
Order placed

## 2023-04-17 ENCOUNTER — Other Ambulatory Visit: Payer: Self-pay | Admitting: Sports Medicine

## 2023-04-19 ENCOUNTER — Other Ambulatory Visit: Payer: Self-pay | Admitting: Family

## 2023-04-19 DIAGNOSIS — N3000 Acute cystitis without hematuria: Secondary | ICD-10-CM

## 2023-04-19 LAB — URINE CULTURE
MICRO NUMBER:: 14894731
SPECIMEN QUALITY:: ADEQUATE

## 2023-04-19 LAB — URINALYSIS W MICROSCOPIC + REFLEX CULTURE
Bilirubin Urine: NEGATIVE
Glucose, UA: NEGATIVE
Hgb urine dipstick: NEGATIVE
Hyaline Cast: NONE SEEN /LPF
Ketones, ur: NEGATIVE
Nitrites, Initial: NEGATIVE
Protein, ur: NEGATIVE
RBC / HPF: NONE SEEN /HPF (ref 0–2)
Specific Gravity, Urine: 1.01 (ref 1.001–1.035)
Squamous Epithelial / HPF: NONE SEEN /HPF (ref <=5)
pH: 6.5 (ref 5.0–8.0)

## 2023-04-19 LAB — CULTURE INDICATED

## 2023-04-19 MED ORDER — SULFAMETHOXAZOLE-TRIMETHOPRIM 800-160 MG PO TABS: 1.0000 | ORAL_TABLET | Freq: Two times a day (BID) | ORAL | 0 refills | Status: AC

## 2023-04-19 NOTE — Progress Notes (Signed)
Positive UTI  Sent in bactrim to have pt start

## 2023-04-27 DIAGNOSIS — J449 Chronic obstructive pulmonary disease, unspecified: Secondary | ICD-10-CM | POA: Diagnosis not present

## 2023-04-27 DIAGNOSIS — I509 Heart failure, unspecified: Secondary | ICD-10-CM | POA: Diagnosis not present

## 2023-05-01 ENCOUNTER — Other Ambulatory Visit: Payer: Self-pay | Admitting: Family

## 2023-05-01 DIAGNOSIS — G43419 Hemiplegic migraine, intractable, without status migrainosus: Secondary | ICD-10-CM

## 2023-05-01 NOTE — Telephone Encounter (Signed)
topiramate (TOPAMAX) 50 MG tablet  LR- 09/21/22 ( 90 tabs/ no refills) LV-04/04/23 NV- 10/15/23

## 2023-05-07 IMAGING — MR MR HEAD W/O CM
8 of 10 series · 37 of 48 positions shown · non-contrast
Comparison: Same-day CT/CTA head and neck

CLINICAL DATA: Syncope after renal ultrasound, left arm face, and
leg numbness and tingling

EXAM:
MRI HEAD WITHOUT CONTRAST
TECHNIQUE: Multiplanar, multiecho pulse sequences of the brain and surrounding
structures were obtained without intravenous contrast.

[Series 5: ax dwi_tracew · axial · 3.0mm · 0.65mm/px · z∈[-92,+60]mm · 6 of 48 slices shown]
[im 1/48]
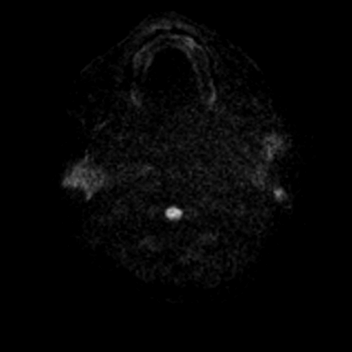
[im 10/48]
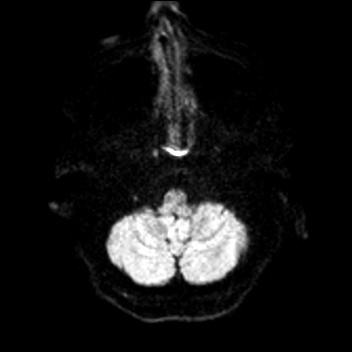
[im 19/48]
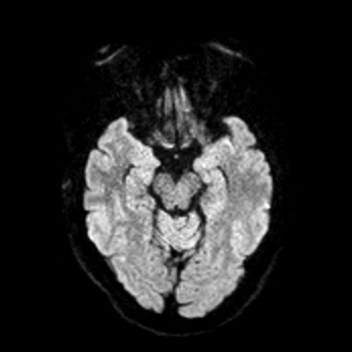
[im 29/48]
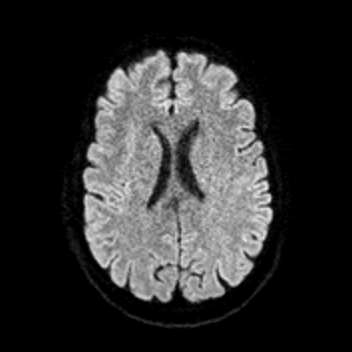
[im 38/48]
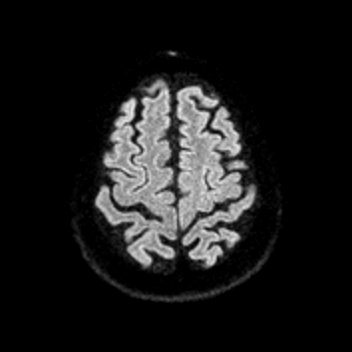
[im 48/48]
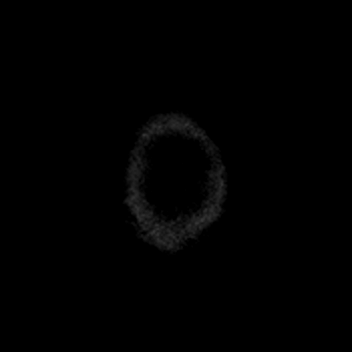

[Series 6: ax dwi_adc · axial · 3.0mm · 0.65mm/px · z∈[-92,+60]mm · 6 of 48 slices shown]
[im 1/48]
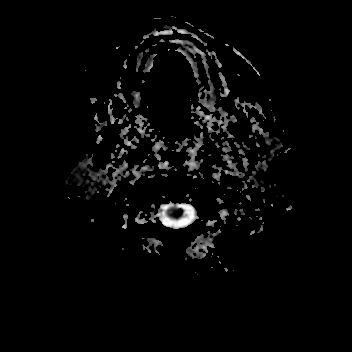
[im 10/48]
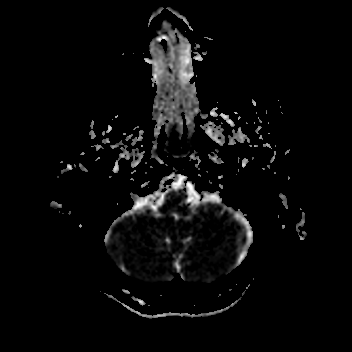
[im 19/48]
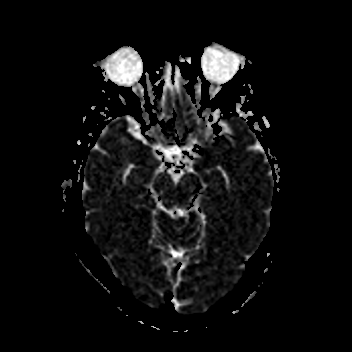
[im 29/48]
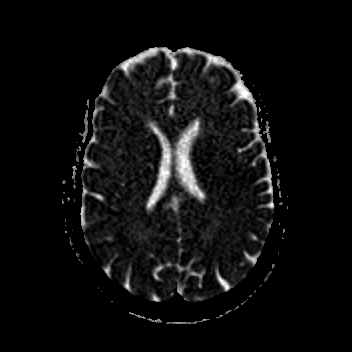
[im 38/48]
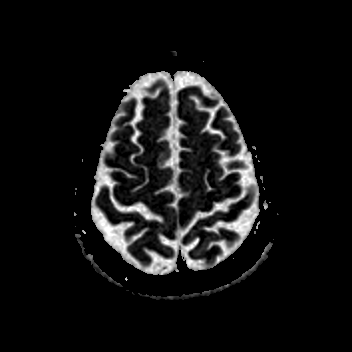
[im 48/48]
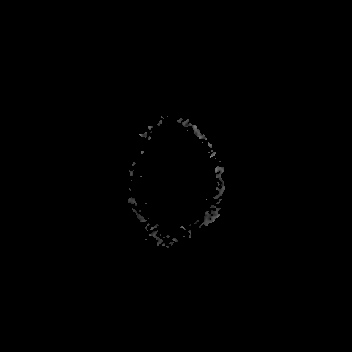

[Series 7: cor dwi_tracew · coronal · 5.0mm · 0.68mm/px · 3 of 40 slices shown]
[im 1/40]
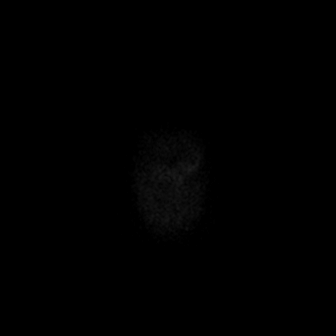
[im 10/40]
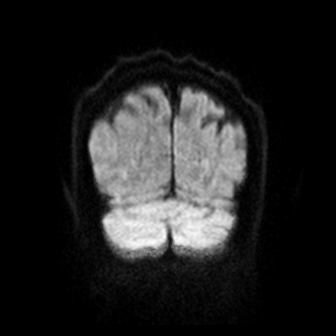
[im 20/40]
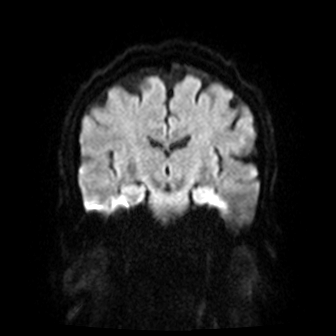

[Series 9: T1 · sagittal · 5.0mm · 0.62mm/px · 3 of 22 slices shown (1 of 2)]
[im 1/22]
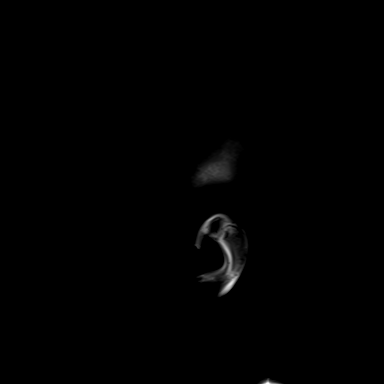
[im 11/22]
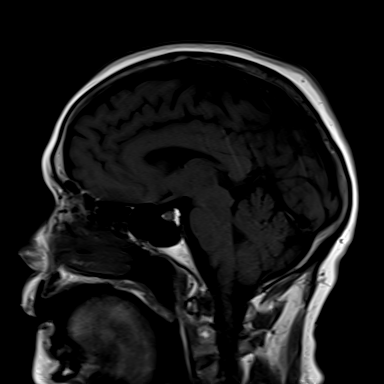
[im 22/22]
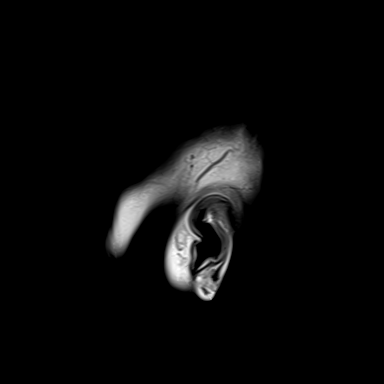

[Series 10: T2 · axial · 5.0mm · 0.53mm/px · z∈[-99,+55]mm · 4 of 27 slices shown (1 of 2)]
[im 1/27]
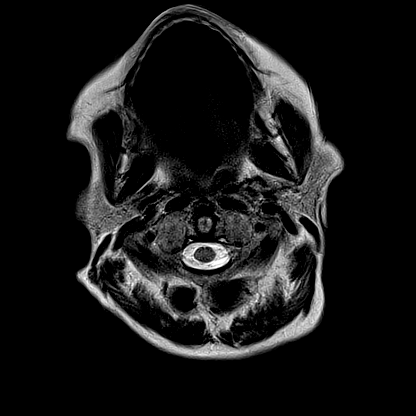
[im 9/27]
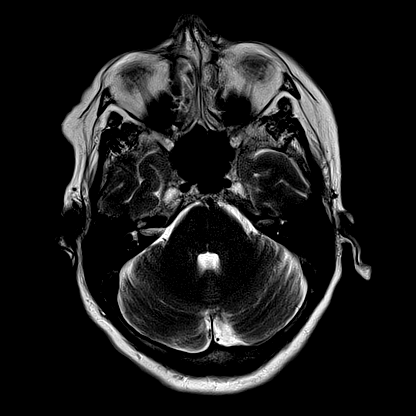
[im 18/27]
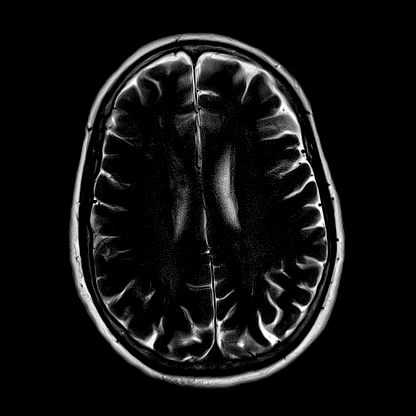
[im 27/27]
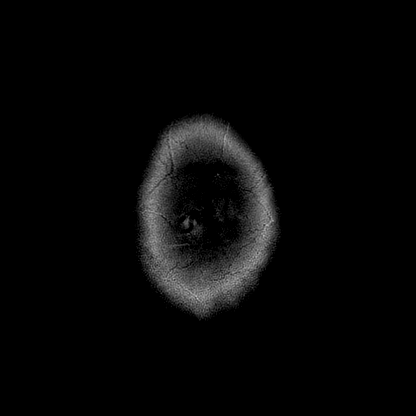

[Series 11: FLAIR · axial · 3.0mm · 0.53mm/px · z∈[-103,+57]mm · 7 of 55 slices shown]
[im 1/55]
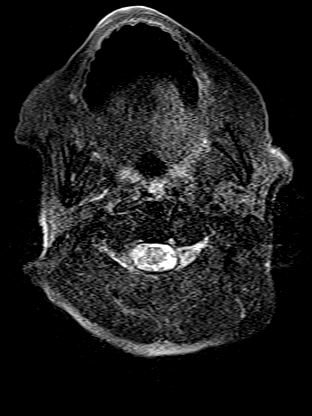
[im 10/55]
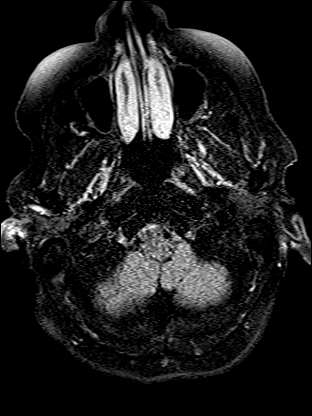
[im 19/55]
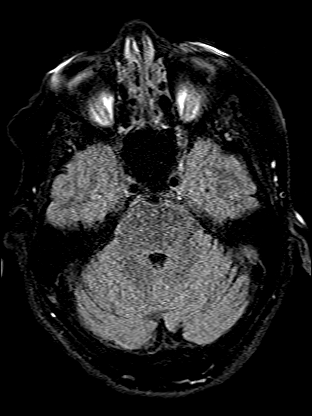
[im 28/55]
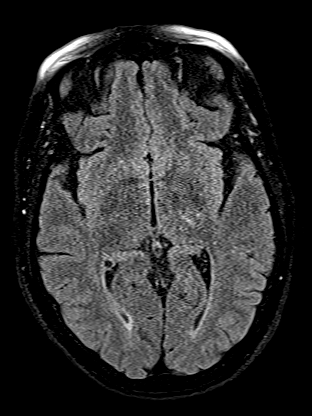
[im 37/55]
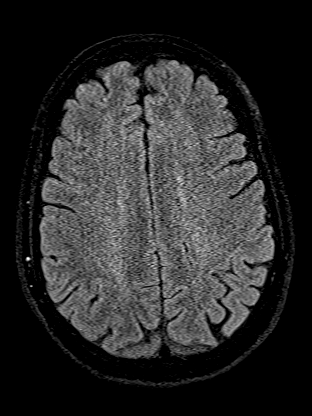
[im 46/55]
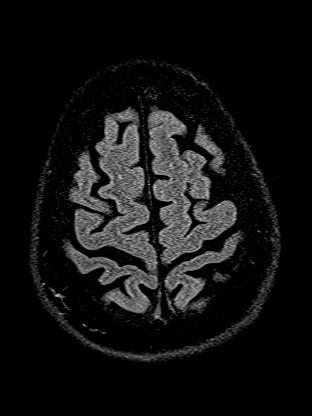
[im 55/55]
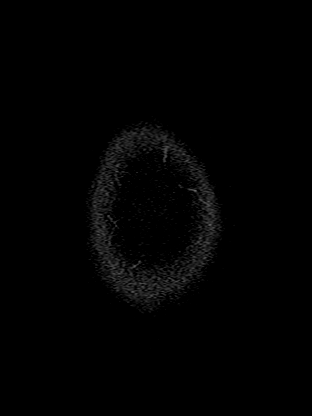

[Series 12: T1 · axial · 5.0mm · 0.90mm/px · z∈[-98,+55]mm · 4 of 27 slices shown (2 of 2)]
[im 1/27]
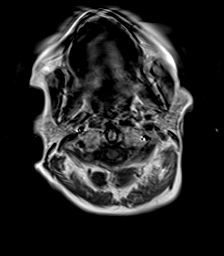
[im 9/27]
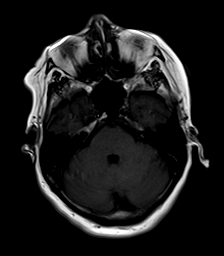
[im 18/27]
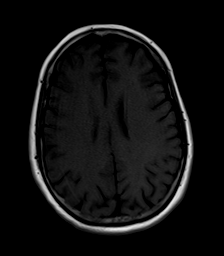
[im 27/27]
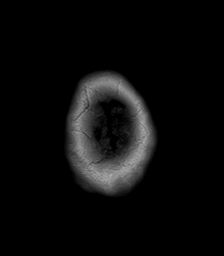

[Series 14: T2 · coronal · 5.0mm · 0.45mm/px · 4 of 31 slices shown (2 of 2)]
[im 1/31]
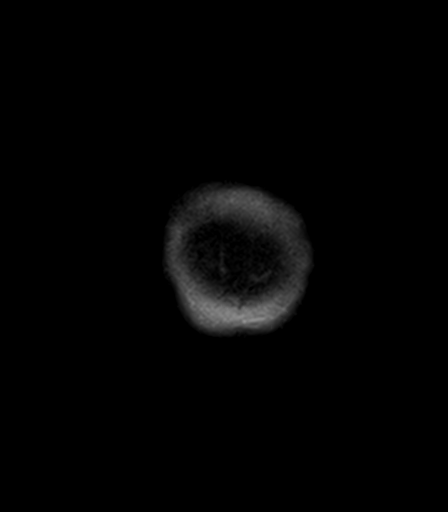
[im 11/31]
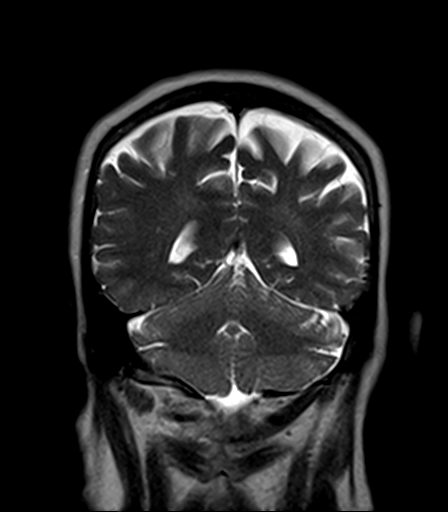
[im 21/31]
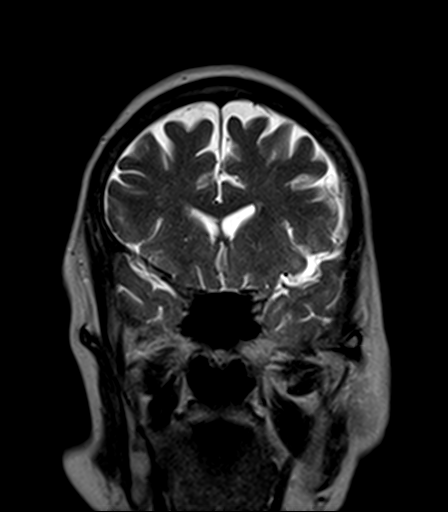
[im 31/31]
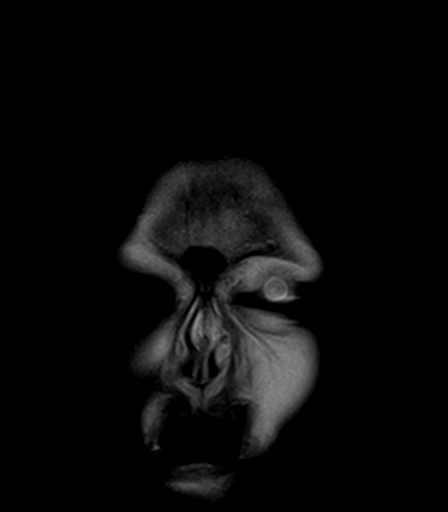

[37 of 48 positions shown; findings below may reference images not displayed]

FINDINGS: Brain: There is no evidence of acute intracranial hemorrhage,
extra-axial fluid collection, or acute infarct.

Parenchymal volume is normal. The ventricles are normal in size.
There are a few scattered small foci of FLAIR signal abnormality in
the subcortical and periventricular white matter, nonspecific but
likely reflecting sequela of mild chronic white matter
microangiopathy.

There is no suspicious parenchymal signal abnormality. There is no
mass lesion. There is no midline shift.

Vascular: Normal flow voids.

Skull and upper cervical spine: Normal marrow signal.

Sinuses/Orbits: The paranasal sinuses are clear. The globes and
orbits are unremarkable.

Other: None.
IMPRESSION: No acute intracranial pathology.

## 2023-05-07 IMAGING — CT CT ABD-PELV W/O CM
2 of 4 series · 16 of 46 positions shown, 18 images · non-contrast
Comparison: 02/05/2021

CLINICAL DATA: Acute abdominal pain, headache, DEION NAING administered
today after potential stroke symptoms.



[Series 2: routine abd/pel wo · axial · 0.98mm/px · z∈[-230,+235]mm · 13 of 103 slices shown, 15 images]
[im 5/103  soft-tissue]
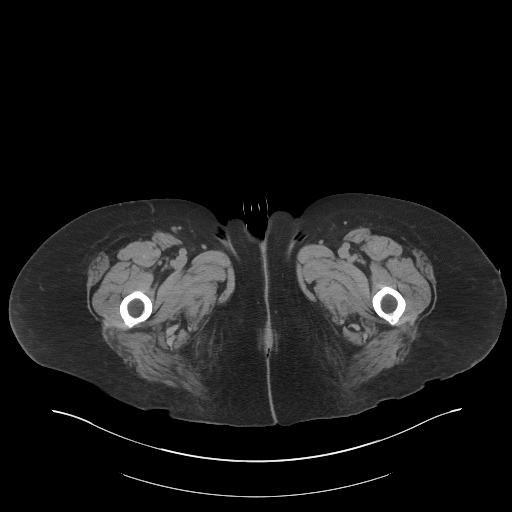
[im 5/103  bone]
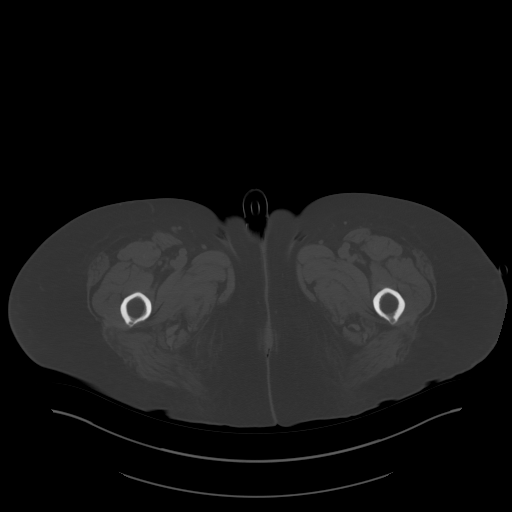
[im 13/103  soft-tissue]
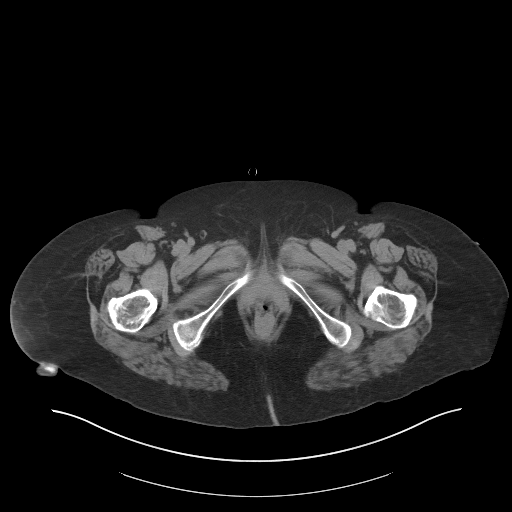
[im 21/103  soft-tissue]
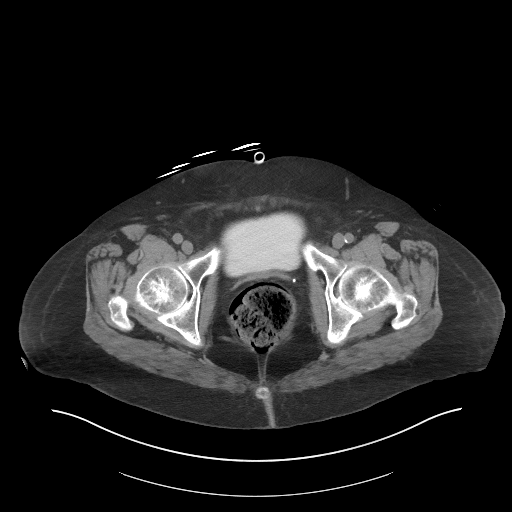
[im 29/103  soft-tissue]
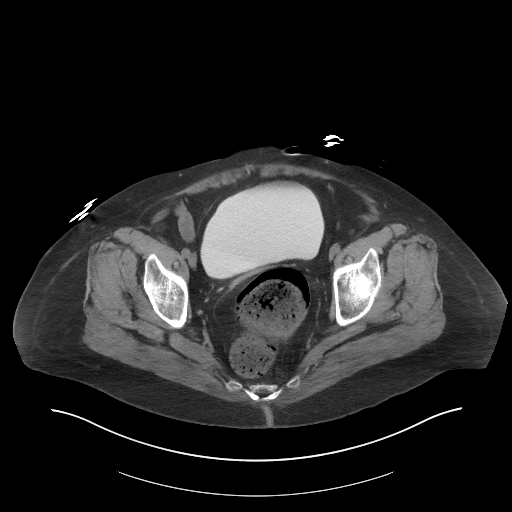
[im 37/103  soft-tissue]
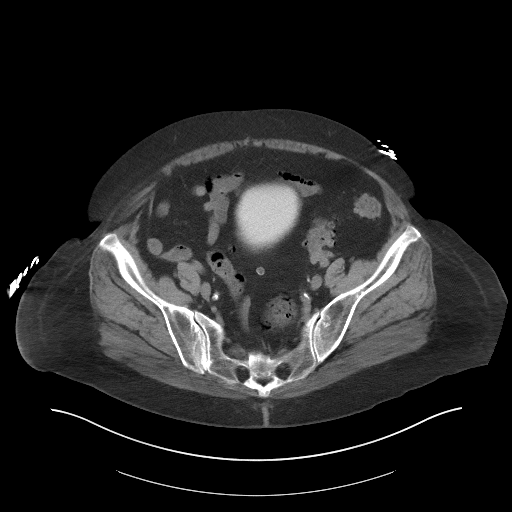
[im 45/103  soft-tissue]
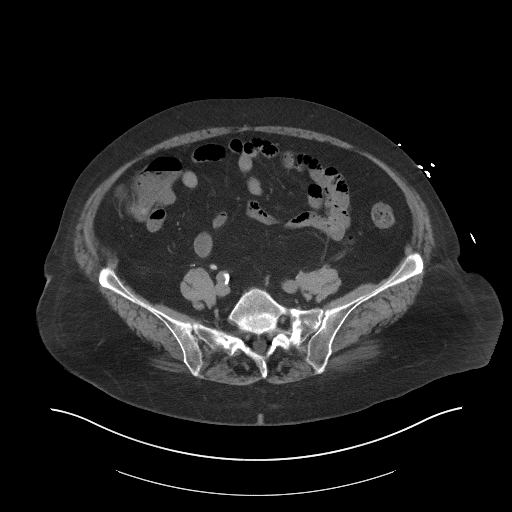
[im 54/103  soft-tissue]
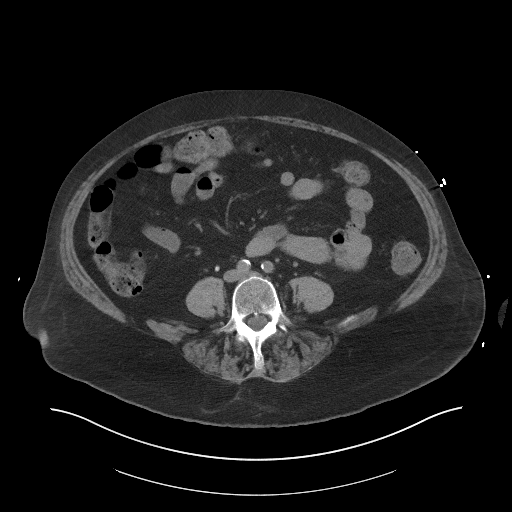
[im 58/103  soft-tissue]
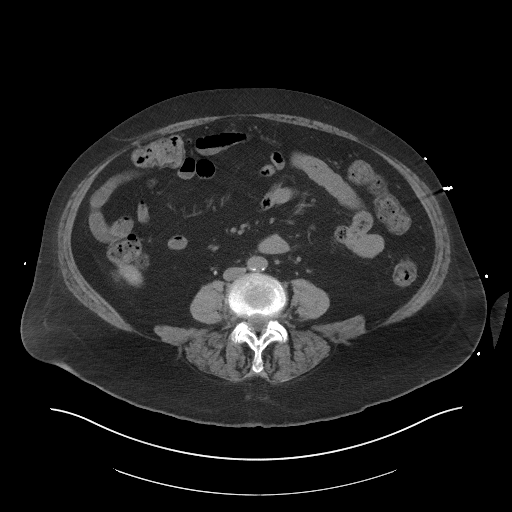
[im 66/103  soft-tissue]
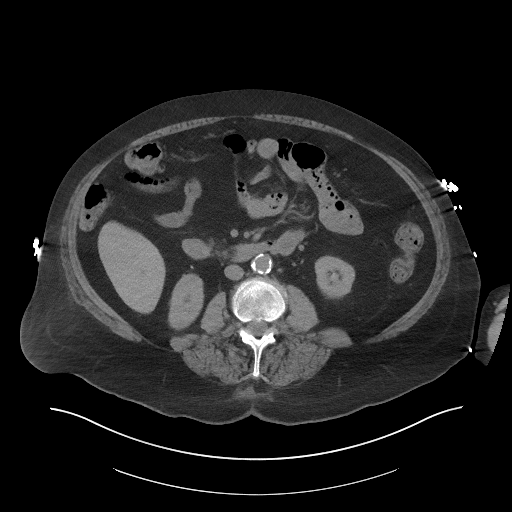
[im 66/103  bone]
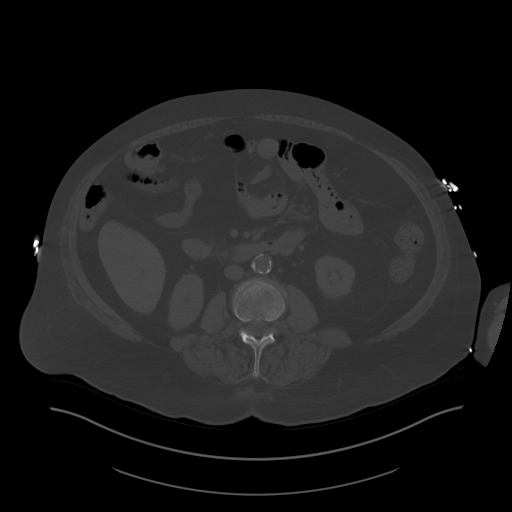
[im 74/103  soft-tissue]
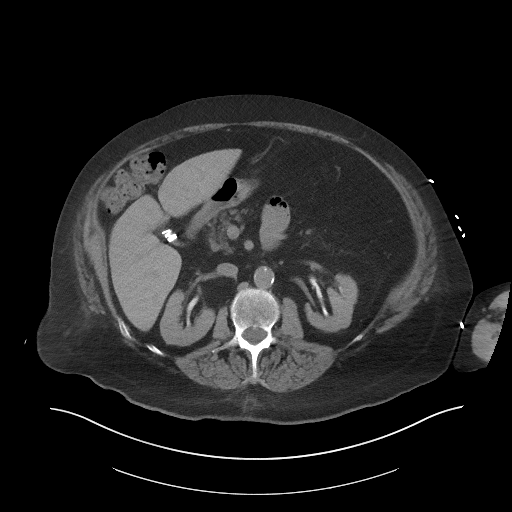
[im 82/103  soft-tissue]
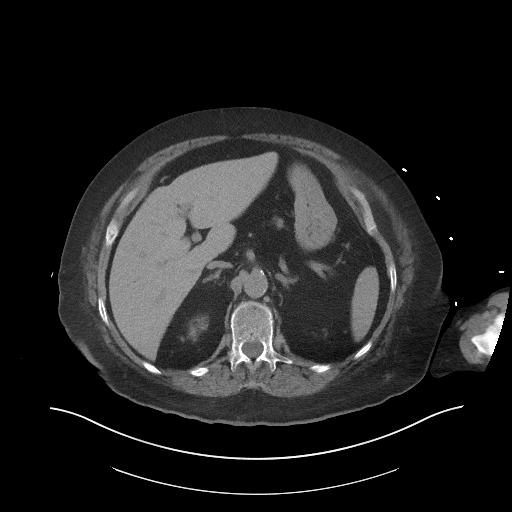
[im 90/103  soft-tissue]
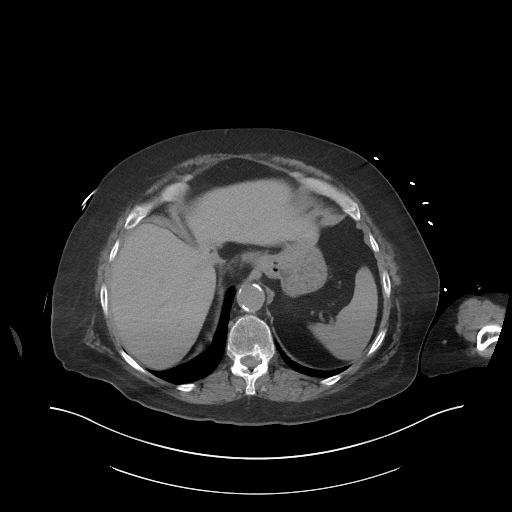
[im 98/103  soft-tissue]
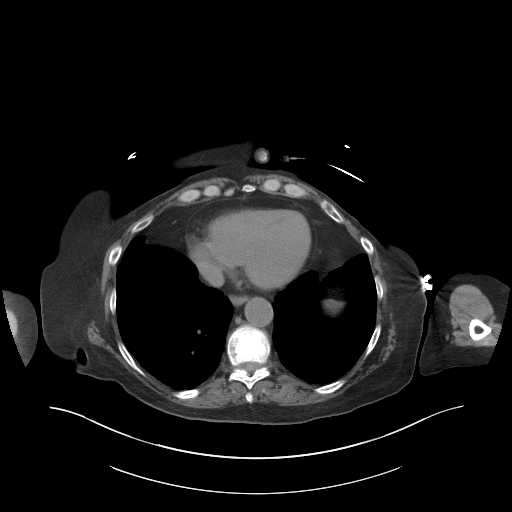

[Series 5: coronal st · coronal · 0.91mm/px · 3 of 112 slices shown]
[im 38/112  soft-tissue]
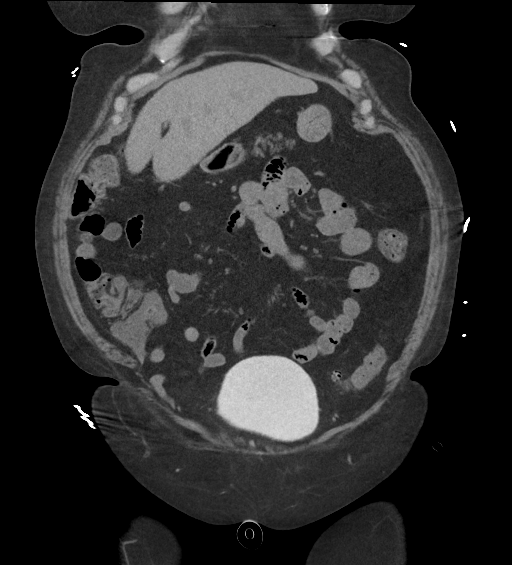
[im 50/112  soft-tissue]
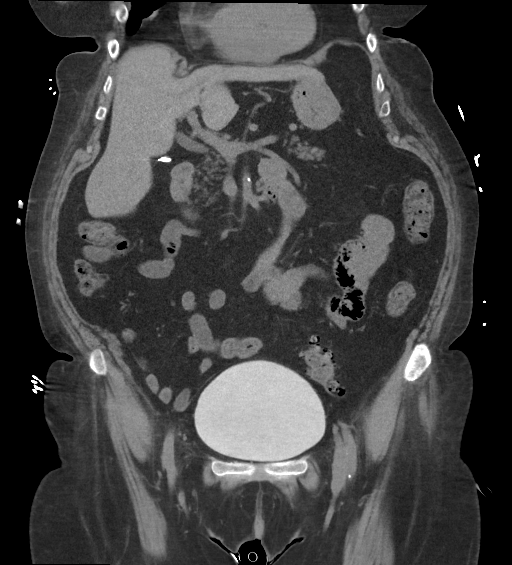
[im 62/112  soft-tissue]
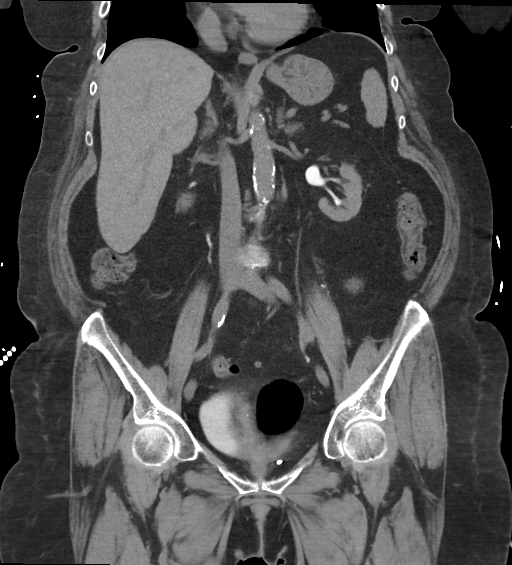

[16 of 46 positions shown; findings below may reference images not displayed]

FINDINGS: Lower chest: Descending thoracic aortic atherosclerotic
calcification. Small amount of methacrylate along pulmonary
vasculature in the left lower lobe.

Hepatobiliary: Cholecystectomy.  Otherwise unremarkable.

Pancreas: Unremarkable

Spleen: Unremarkable

Adrenals/Urinary Tract: There is excreted contrast medium in the
renal collecting systems, ureters, and urinary bladder related to
prior contrast administration. Adrenal glands and kidneys appear
otherwise unremarkable.

Stomach/Bowel: Sigmoid colon diverticulosis. Scattered diverticula
elsewhere in the colon. No dilated bowel.

Vascular/Lymphatic: Atherosclerosis is present, including aortoiliac
atherosclerotic disease.

Reproductive: Uterus absent.  Adnexa unremarkable.

Other: No supplemental non-categorized findings.

Musculoskeletal: Vertebral augmentations at T8 and T9.
IMPRESSION: 1. No acute findings.
2. Vertebral augmentations at T9 and likely T8, with a trace amount
of methacrylate along left lower lobe pulmonary arterial structures
on the top most image (this is usually clinically inconsequential).
3. Sigmoid colon diverticulosis.
4. Aortic Atherosclerosis ([id number]). Peripheral atherosclerosis.

## 2023-05-07 IMAGING — CT CT HEAD W/O CM
4 series · 16 of 47 positions shown, 18 images · non-contrast
Comparison: Multiple exams, including 01/18/2022 at [DATE] a.m.

CLINICAL DATA: Dizziness following renal ultrasound today. Left
extremity numbness and tingling with left facial weakness. Status
Brunilda Rox.



[Series 2: head wo · axial · 0.40mm/px · z∈[+606,+721]mm · 7 of 31 slices shown, 9 images]
[im 4/31  brain]
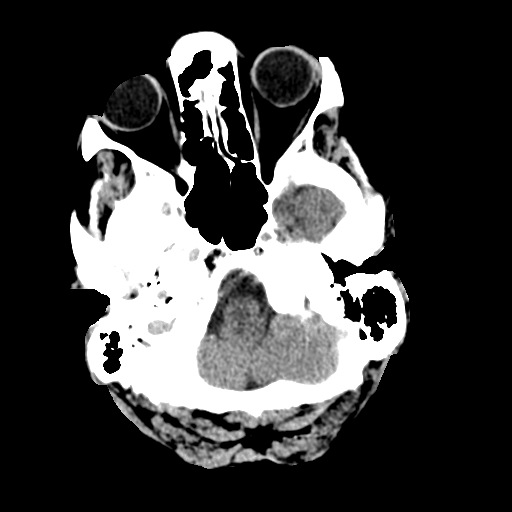
[im 4/31  bone]
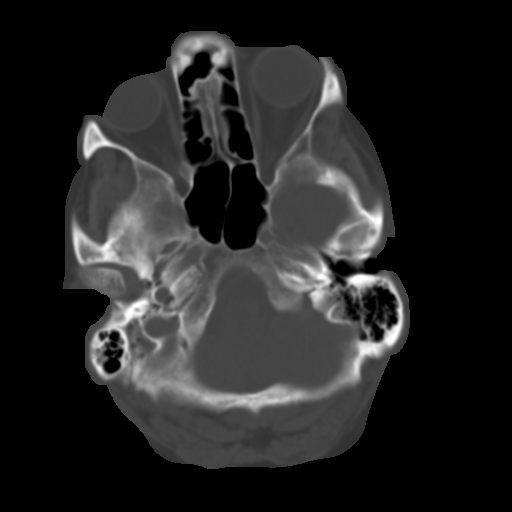
[im 8/31  brain]
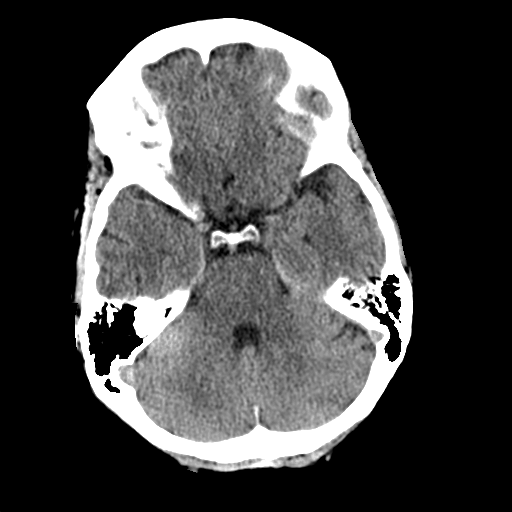
[im 12/31  brain]
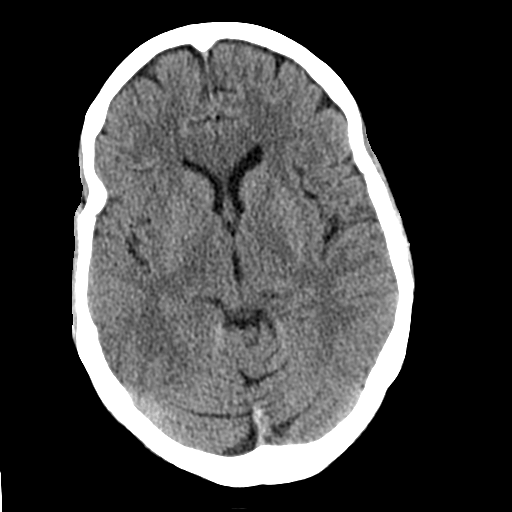
[im 16/31  brain]
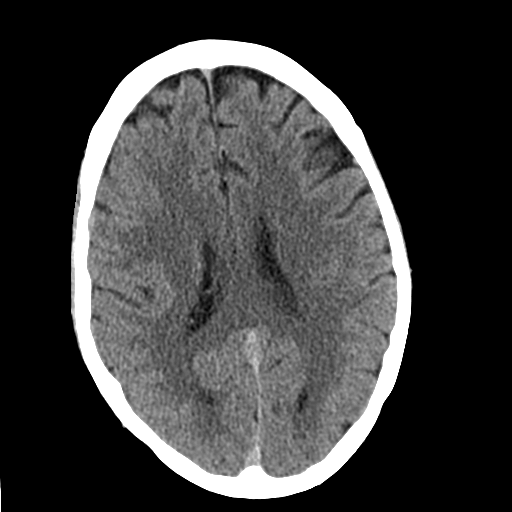
[im 19/31  brain]
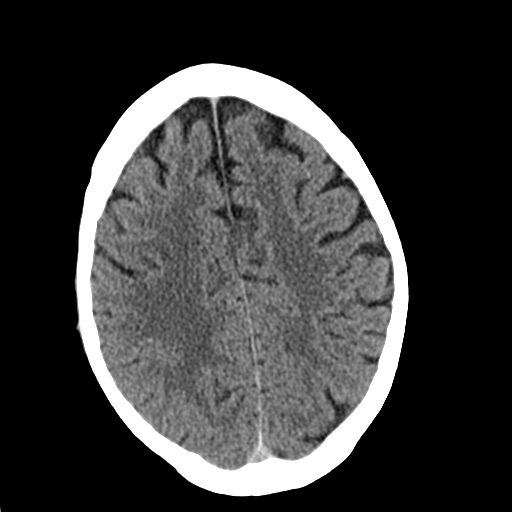
[im 19/31  bone]
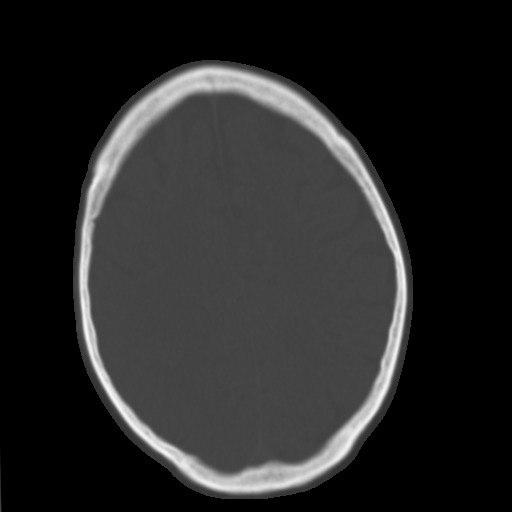
[im 23/31  brain]
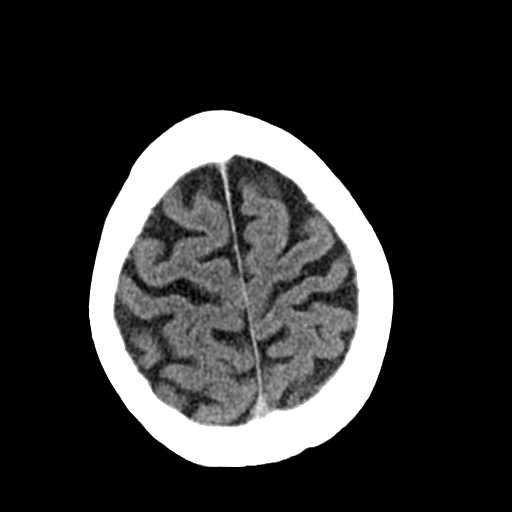
[im 27/31  brain]
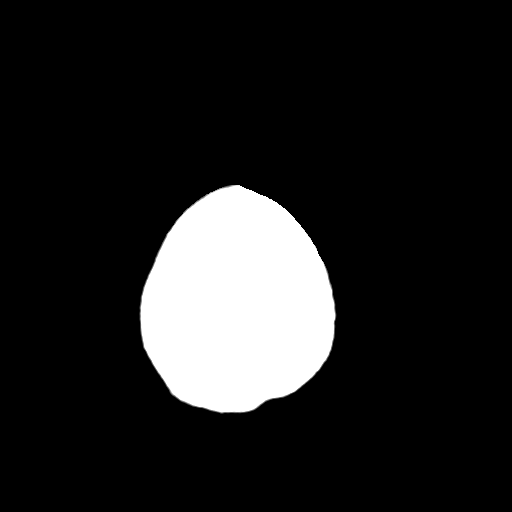

[Series 3: head bone · axial · 0.40mm/px · z∈[+605,+635]mm · 3 of 76 slices shown]
[im 8/76  bone]
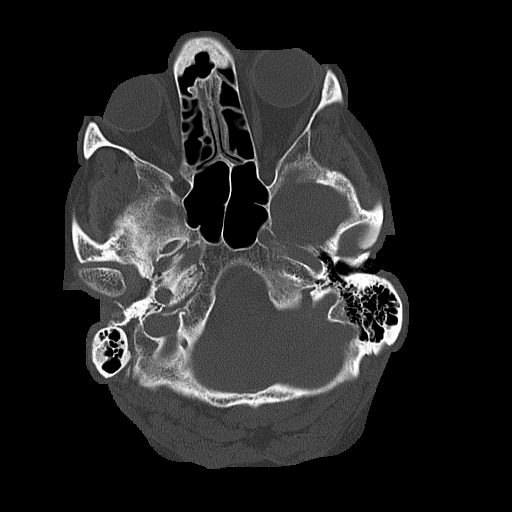
[im 16/76  bone]
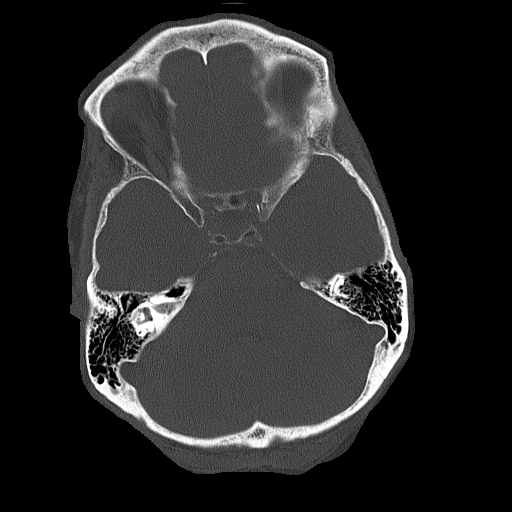
[im 23/76  bone]
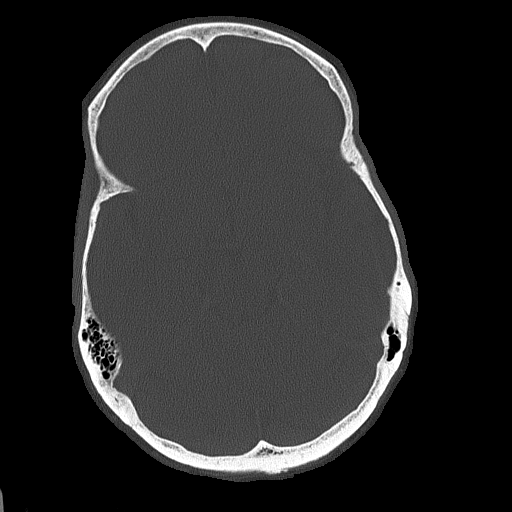

[Series 4: coronal soft tissue · coronal · 0.30mm/px · 3 of 67 slices shown]
[im 23/67  brain]
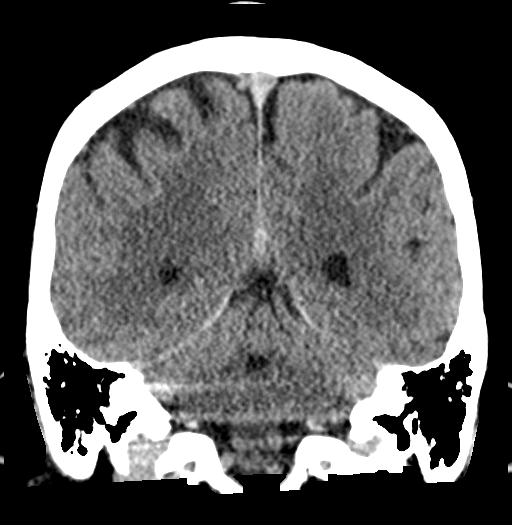
[im 30/67  brain]
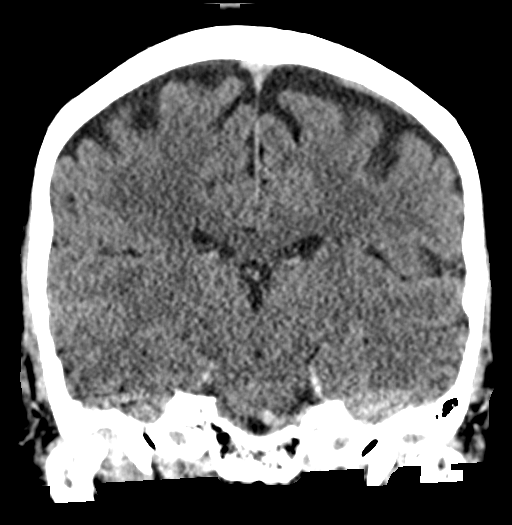
[im 37/67  brain]
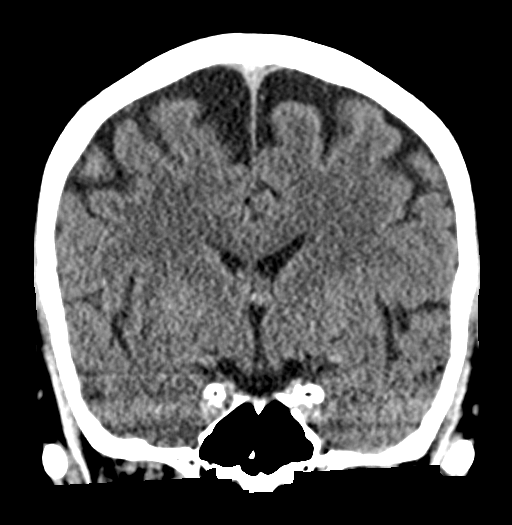

[Series 5: sagittal soft tissue · sagittal · 0.30mm/px · 3 of 51 slices shown]
[im 17/51  brain]
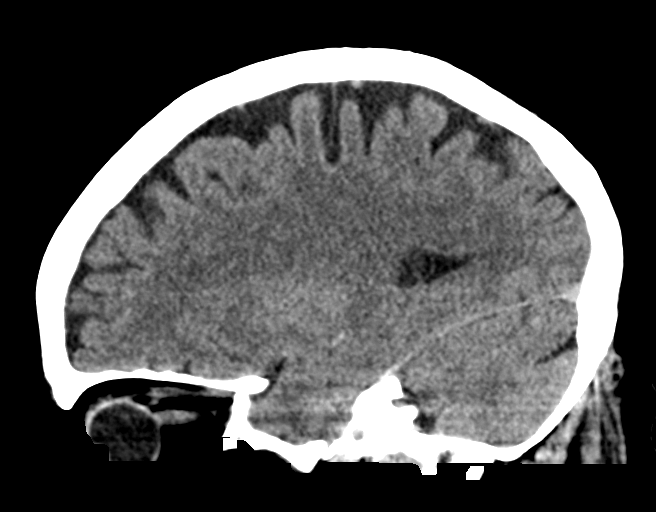
[im 26/51  brain]
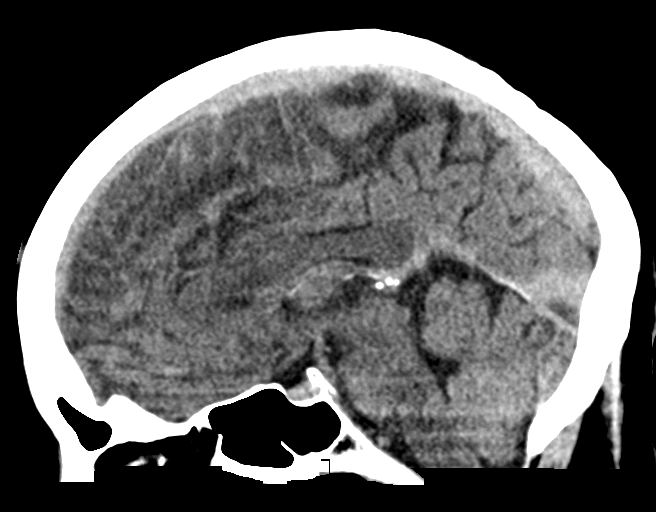
[im 34/51  brain]
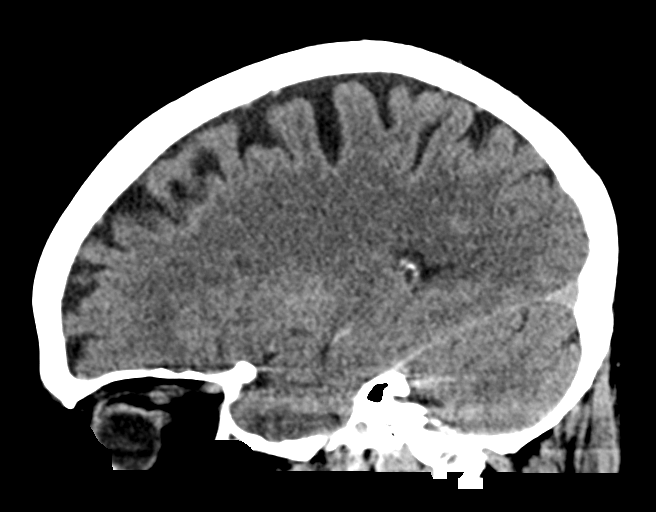

[16 of 47 positions shown; findings below may reference images not displayed]

FINDINGS: Brain: The brainstem, cerebellum, cerebral peduncles, thalami, basal
ganglia, basilar cisterns, and ventricular system appear within
normal limits. No intracranial hemorrhage, mass lesion, or acute
CVA.

Vascular: No hyperdense vessel observed.

Skull: Unremarkable

Sinuses/Orbits: Mild mucosal thickening in the ethmoid air cells
suggesting mild chronic sinusitis.

Other: No supplemental non-categorized findings.
IMPRESSION: 1. Stable appearance from earlier today, with no specific CT
findings of acute CVA or acute intracranial abnormality.
2. Mild chronic ethmoid sinusitis.

## 2023-05-08 IMAGING — CT CT HEAD W/O CM
4 series · 17 of 47 positions shown, 19 images · non-contrast
Comparison: January 18, 2022.

CLINICAL DATA: Stroke, follow up



[Series 2: head bone · axial · 0.41mm/px · z∈[-120,-64]mm · 4 of 80 slices shown]
[im 8/80  bone]
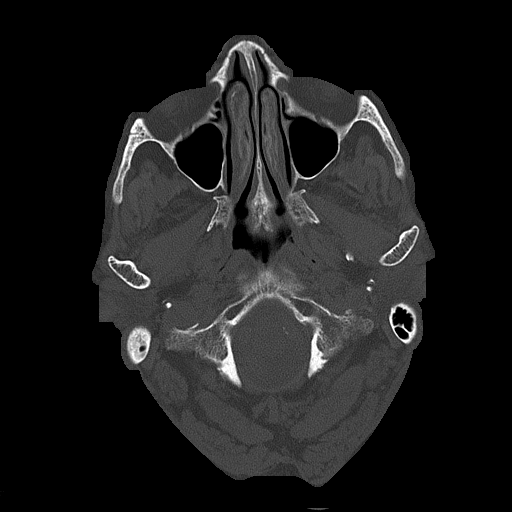
[im 16/80  bone]
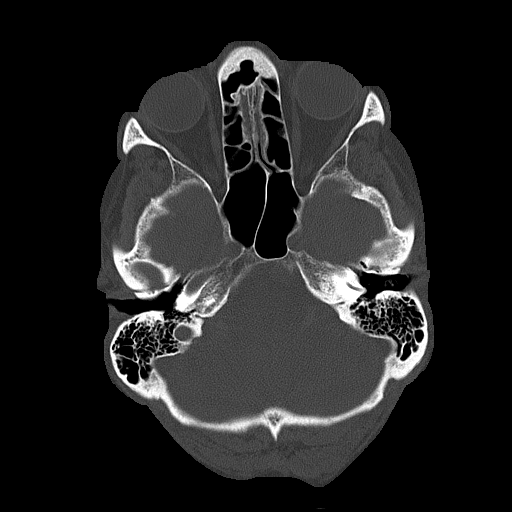
[im 24/80  bone]
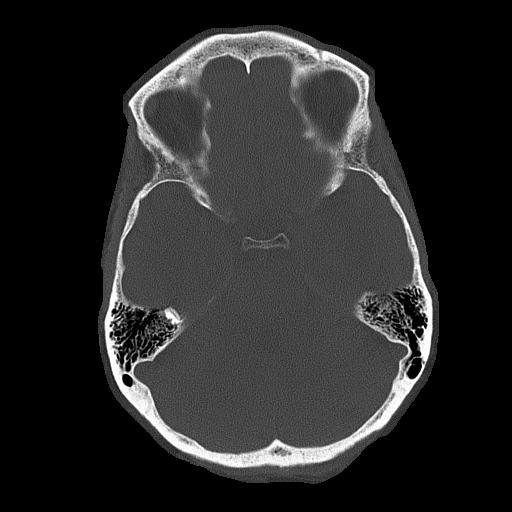
[im 36/80  bone]
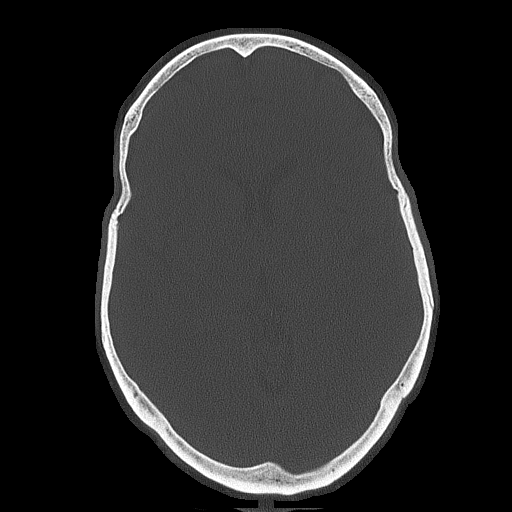

[Series 3: head wo · axial · 0.41mm/px · z∈[-119,+1]mm · 7 of 32 slices shown, 9 images]
[im 4/32  brain]
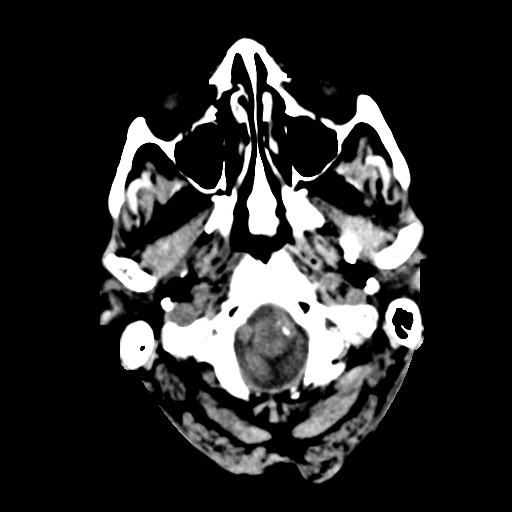
[im 4/32  bone]
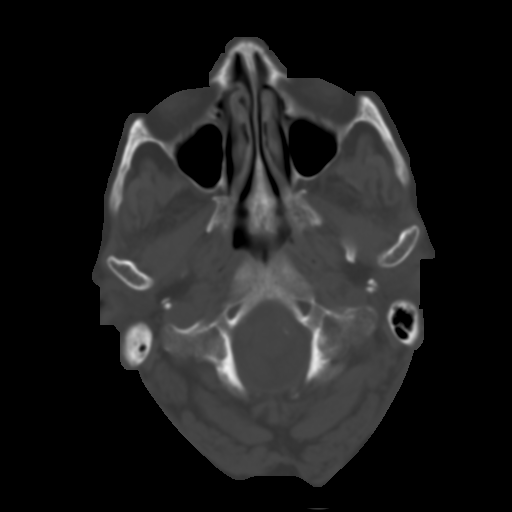
[im 8/32  brain]
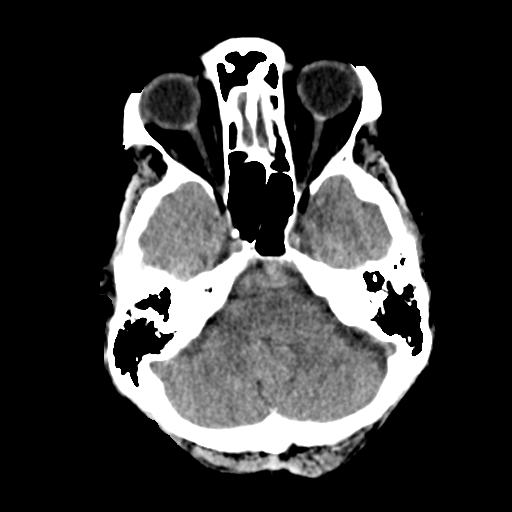
[im 12/32  brain]
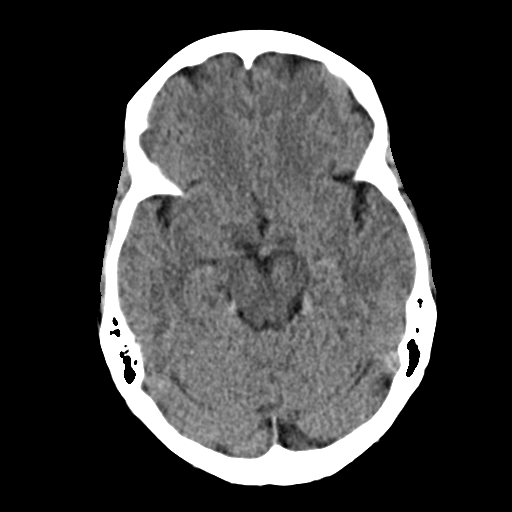
[im 16/32  brain]
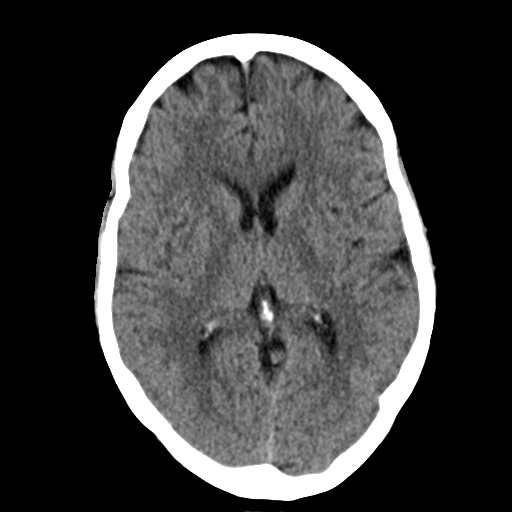
[im 20/32  brain]
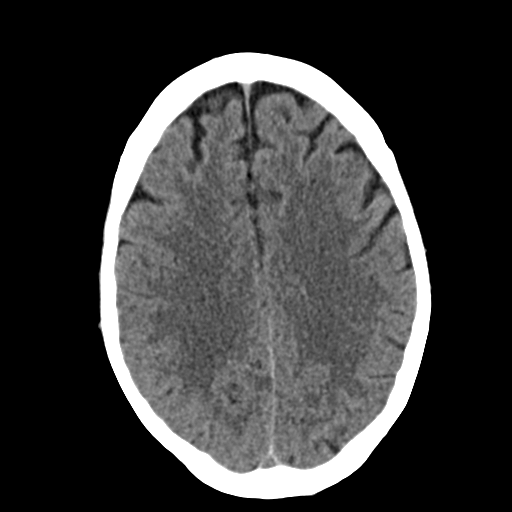
[im 20/32  bone]
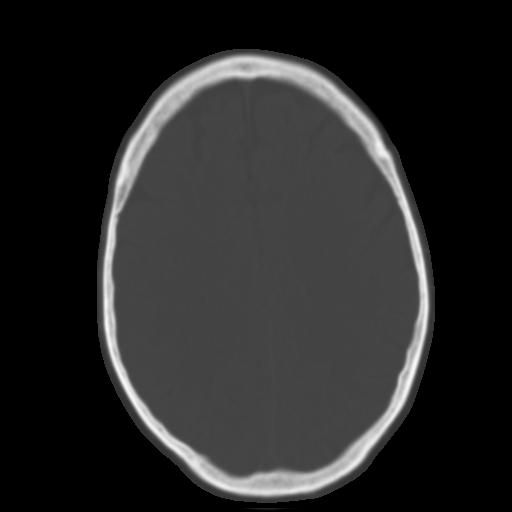
[im 24/32  brain]
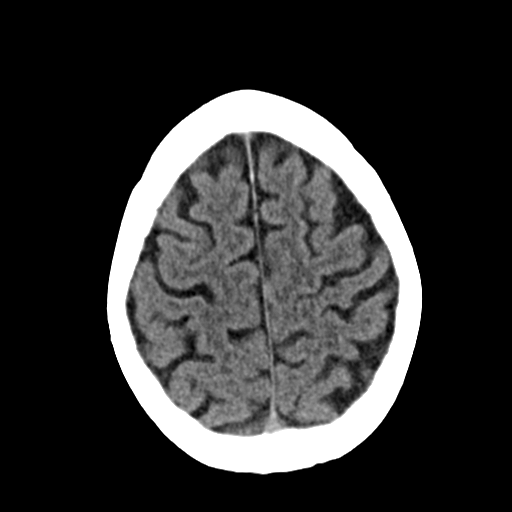
[im 28/32  brain]
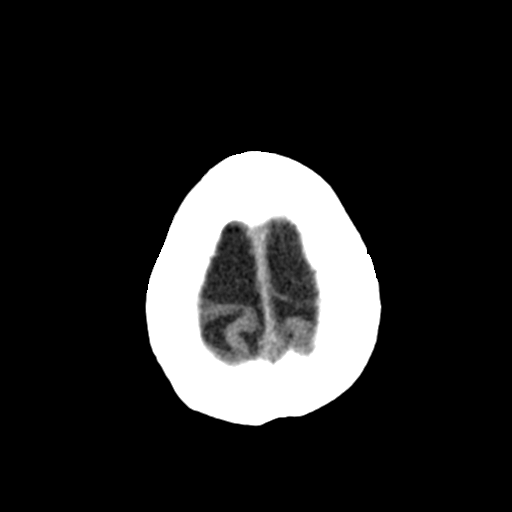

[Series 4: coronal soft tissue · coronal · 0.33mm/px · 3 of 67 slices shown]
[im 23/67  brain]
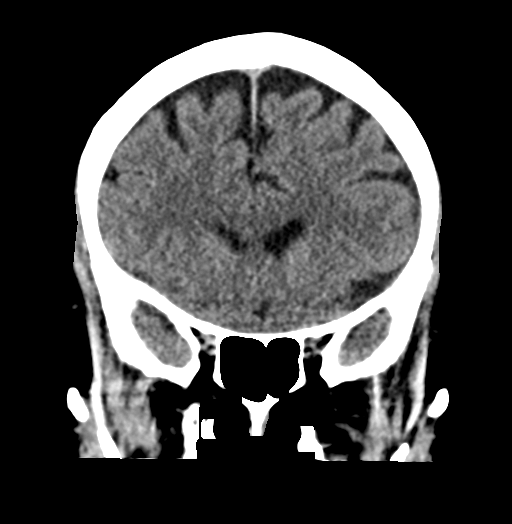
[im 30/67  brain]
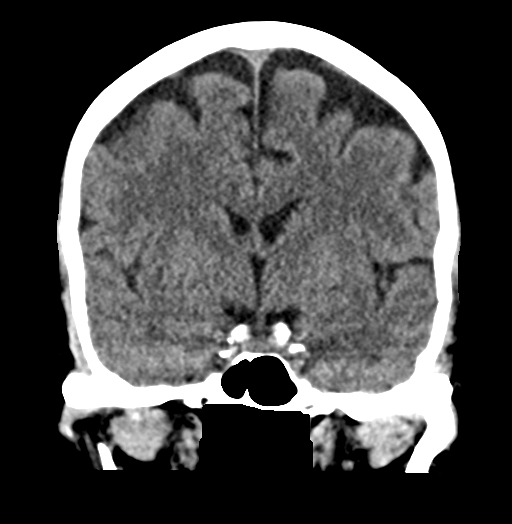
[im 37/67  brain]
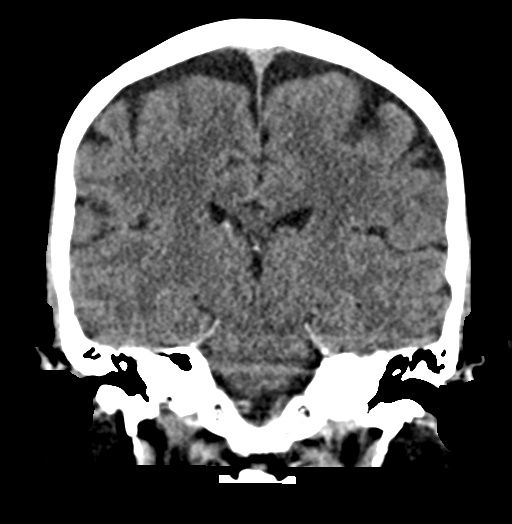

[Series 5: sagittal soft tissue · sagittal · 0.33mm/px · 3 of 54 slices shown]
[im 18/54  brain]
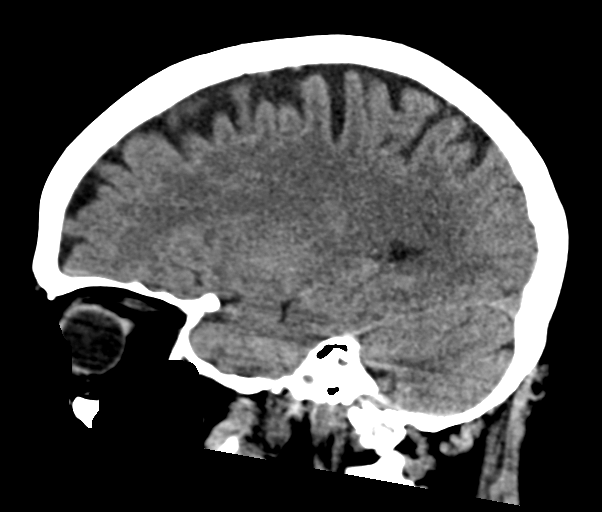
[im 27/54  brain]
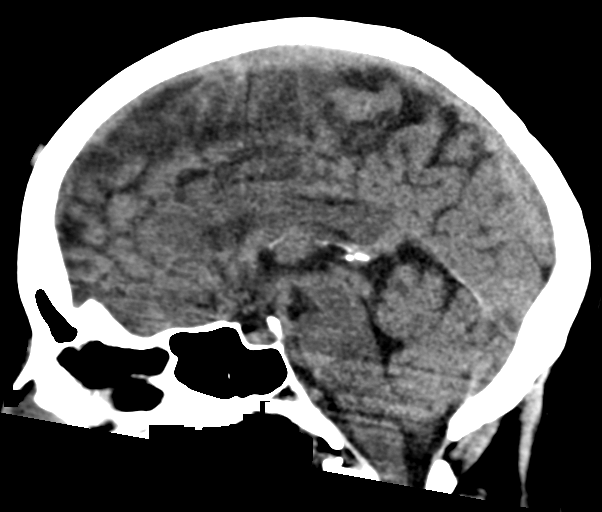
[im 36/54  brain]
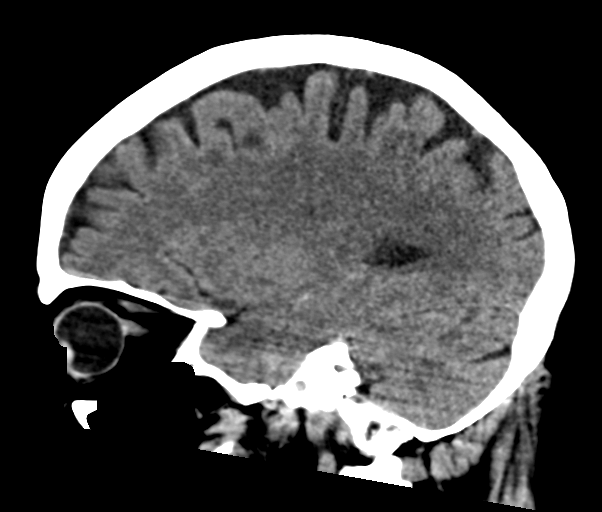

[17 of 47 positions shown; findings below may reference images not displayed]

FINDINGS: Brain: No evidence of acute infarction, hemorrhage, hydrocephalus,
extra-axial collection or mass lesion/mass effect.

Vascular: No hyperdense vessel identified.

Skull: No acute fracture.

Sinuses/Orbits: Clear sinuses.  Unremarkable orbits.

Other: No mastoid effusions.
IMPRESSION: No evidence of acute abnormality.

## 2023-05-09 IMAGING — MR MR HEAD WO/W CM
14 series · 48 of 48 positions shown · IV contrast (gadavist)
Comparison: 01/18/2022 MRI brain

CLINICAL DATA: TIA, left-sided weakness and left hemi field cut

EXAM:
MRI HEAD AND ORBITS WITHOUT AND WITH CONTRAST
TECHNIQUE: Multiplanar, multiecho pulse sequences of the brain and surrounding
structures were obtained without and with intravenous contrast.
Multiplanar, multiecho pulse sequences of the orbits and surrounding
structures were obtained including fat saturation techniques, before
and after intravenous contrast administration.
CONTRAST:  10mL GADAVIST GADOBUTROL 1 MMOL/ML IV SOLN

[Series 5: ax dwi_tracew · axial · 3.0mm · 0.65mm/px · z∈[-76,+65]mm · 2 of 44 slices shown]
[im 1/44]
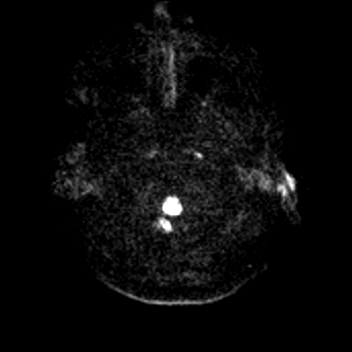
[im 44/44]
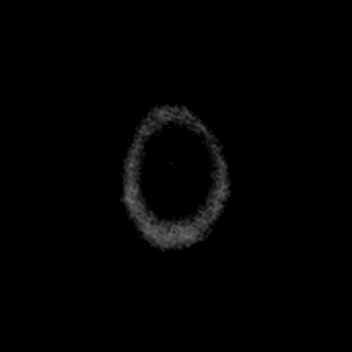

[Series 6: ax dwi_adc · axial · 3.0mm · 0.65mm/px · z∈[-76,+65]mm · 3 of 44 slices shown]
[im 1/44]
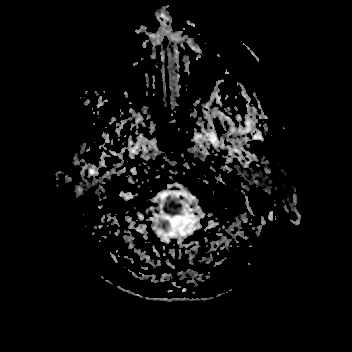
[im 22/44]
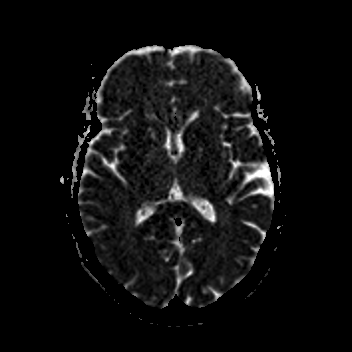
[im 44/44]
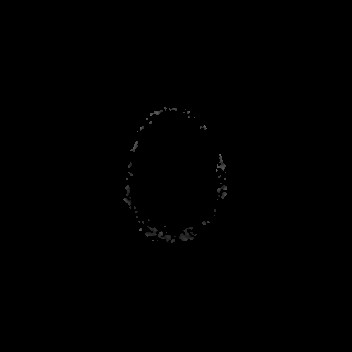

[Series 7: cor dwi_tracew · coronal · 5.0mm · 0.60mm/px · 2 of 38 slices shown]
[im 1/38]
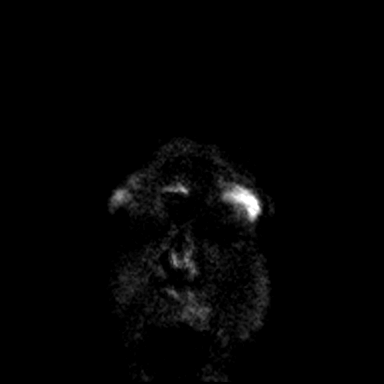
[im 38/38]
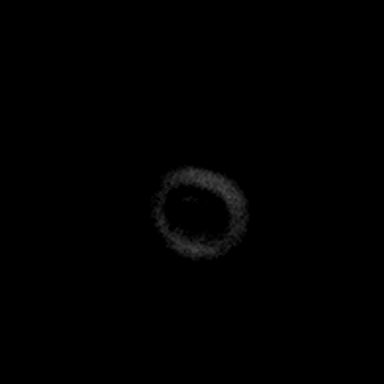

[Series 8: cor dwi_adc · coronal · 5.0mm · 0.60mm/px · 2 of 37 slices shown]
[im 1/37]
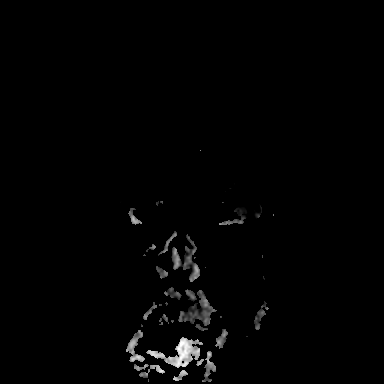
[im 37/37]
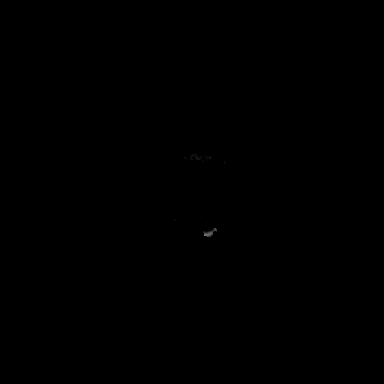

[Series 9: T1 · sagittal · 5.0mm · 0.62mm/px · 1 of 22 slices shown (1 of 2)]
[im 1/22]
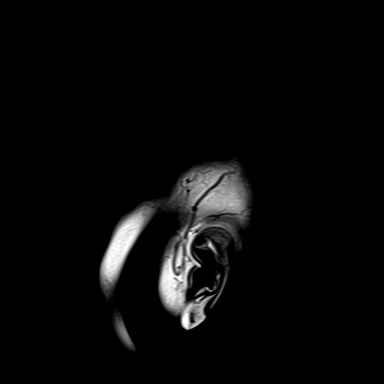

[Series 10: T2 · axial · 5.0mm · 0.53mm/px · z∈[-80,+70]mm · 2 of 26 slices shown]
[im 1/26]
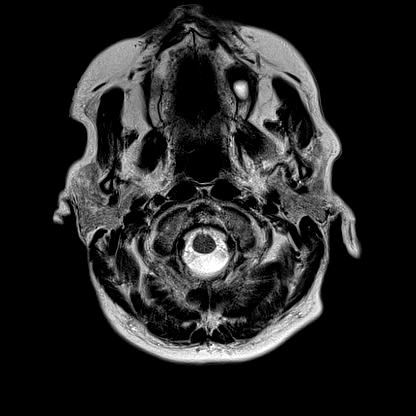
[im 26/26]
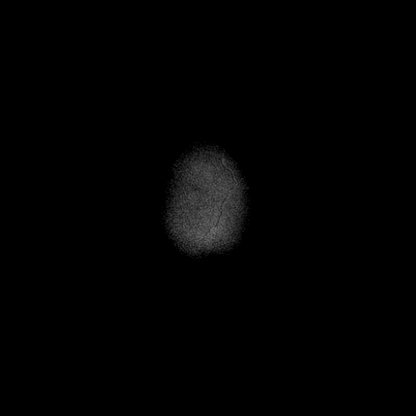

[Series 11: mag_images · axial · 3.0mm · 0.90mm/px · z∈[-87,+77]mm · 3 of 56 slices shown]
[im 1/56]
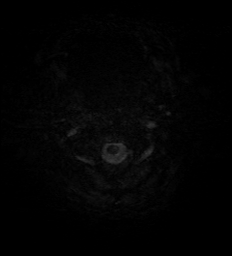
[im 28/56]
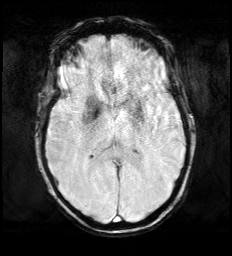
[im 56/56]
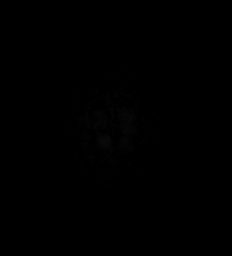

[Series 12: pha_images · axial · 3.0mm · 0.90mm/px · z∈[-87,+74]mm · 3 of 55 slices shown]
[im 1/55]
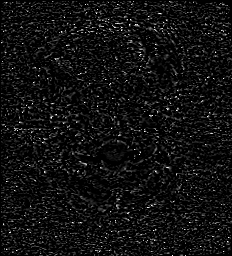
[im 28/55]
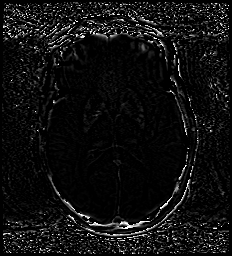
[im 55/55]
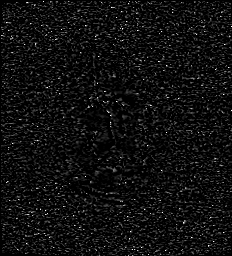

[Series 13: swi_images · axial · 3.0mm · 0.90mm/px · z∈[-87,+77]mm · 3 of 56 slices shown]
[im 1/56]
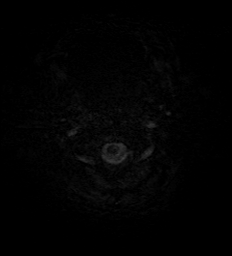
[im 28/56]
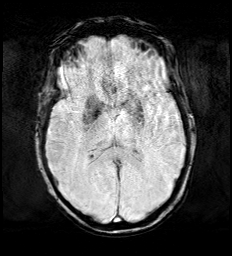
[im 56/56]
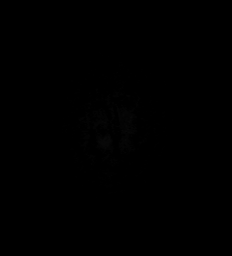

[Series 15: FLAIR · axial · 3.0mm · 0.53mm/px · z∈[-79,+68]mm · 3 of 42 slices shown]
[im 1/42]
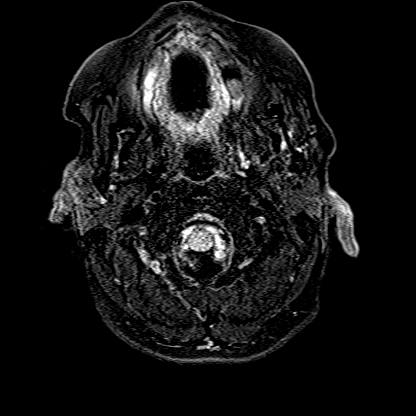
[im 21/42]
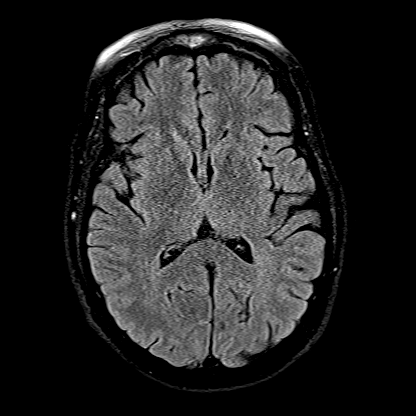
[im 42/42]
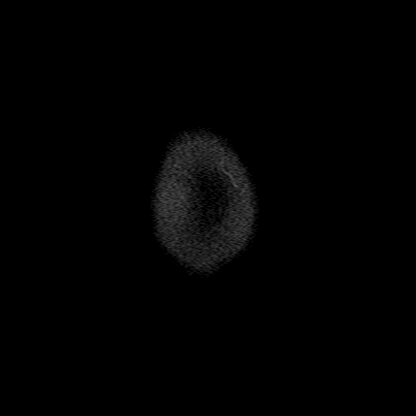

[Series 16: T1 · axial · 1.0mm · 0.98mm/px · z∈[-85,+74]mm · 10 of 160 slices shown (2 of 2)]
[im 1/160]
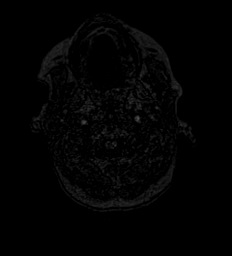
[im 18/160]
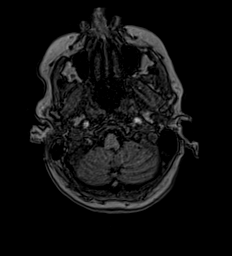
[im 36/160]
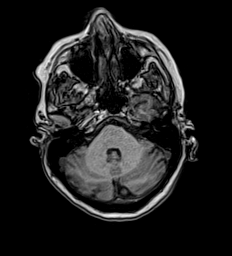
[im 54/160]
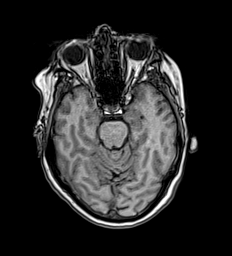
[im 71/160]
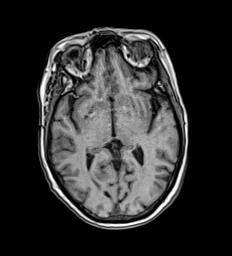
[im 89/160]
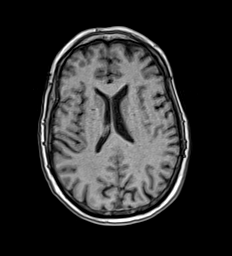
[im 107/160]
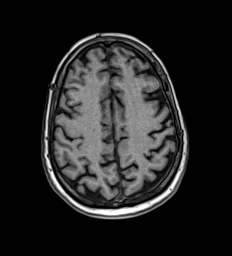
[im 124/160]
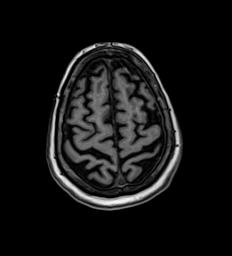
[im 142/160]
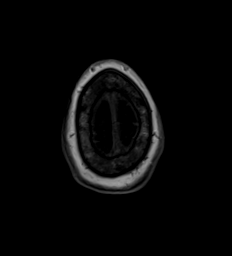
[im 160/160]
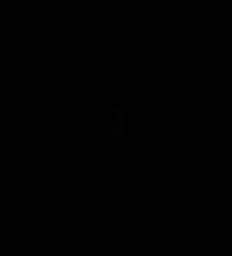

[Series 17: T2 post-contrast · coronal · 5.0mm · 0.57mm/px · 2 of 29 slices shown]
[im 1/29]
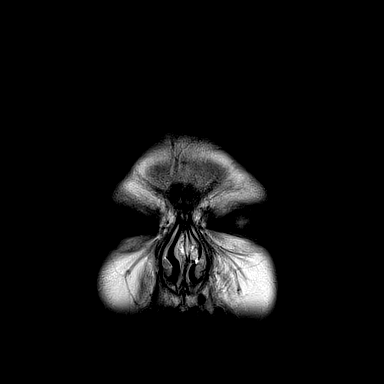
[im 29/29]
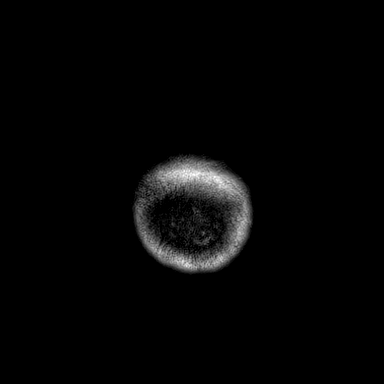

[Series 18: T1 post-contrast · axial · 1.0mm · 0.98mm/px · z∈[-85,+74]mm · 10 of 160 slices shown (1 of 2)]
[im 1/160]
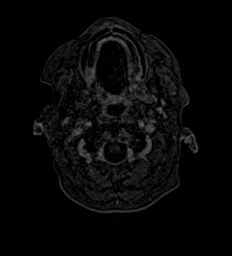
[im 18/160]
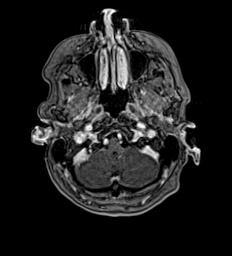
[im 36/160]
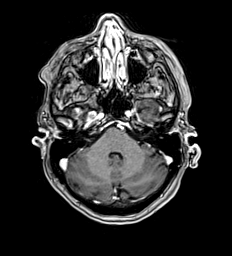
[im 54/160]
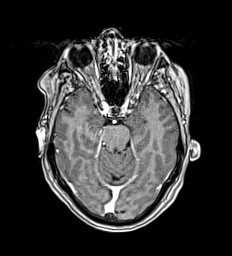
[im 71/160]
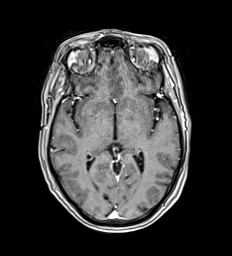
[im 89/160]
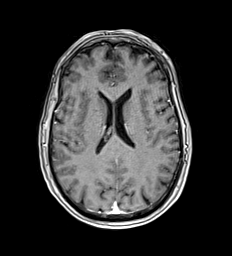
[im 107/160]
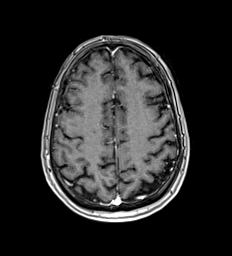
[im 124/160]
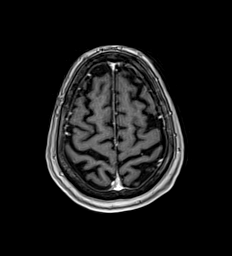
[im 142/160]
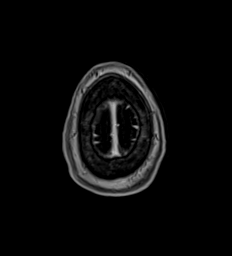
[im 160/160]
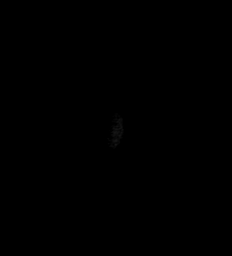

[Series 19: T1 post-contrast · coronal · 5.0mm · 0.57mm/px · 2 of 29 slices shown (2 of 2)]
[im 1/29]
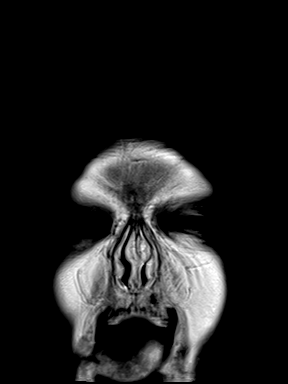
[im 29/29]
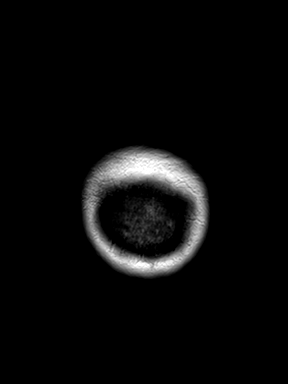

[48 of 48 positions shown; findings below may reference images not displayed]

FINDINGS: MRI HEAD FINDINGS

Evaluation is limited by motion artifact.

Brain: No restricted diffusion to suggest acute or subacute infarct.
No acute hemorrhage, mass, mass effect, or midline shift. No
hemosiderin deposition to suggest remote hemorrhage. No
hydrocephalus or extra-axial collection. Scattered T2 hyperintense
signal in the periventricular white matter, likely the sequela of
minimal chronic small vessel ischemic disease. No abnormal
parenchymal or meningeal enhancement.

Vascular: Normal flow voids and opacified vasculature.

Skull and upper cervical spine: Normal marrow signal.

Other: None.

MRI ORBITS FINDINGS

Evaluation is limited by motion artifact.

Orbits: No traumatic or inflammatory finding. Globes, optic nerves,
orbital fat, extraocular muscles, vascular structures, and lacrimal
glands are normal.

Visualized sinuses: Clear.

Soft tissues: Negative.
IMPRESSION: Evaluation is limited by motion artifact. Within this limitation, no
acute intracranial or orbital process.

## 2023-05-09 IMAGING — MR MR ORBITS WO/W CM
8 of 12 series · 31 of 48 positions shown · IV contrast (gadavist)
Comparison: 01/18/2022 MRI brain

CLINICAL DATA: TIA, left-sided weakness and left hemi field cut

EXAM:
MRI HEAD AND ORBITS WITHOUT AND WITH CONTRAST
TECHNIQUE: Multiplanar, multiecho pulse sequences of the brain and surrounding
structures were obtained without and with intravenous contrast.
Multiplanar, multiecho pulse sequences of the orbits and surrounding
structures were obtained including fat saturation techniques, before
and after intravenous contrast administration.
CONTRAST:  10mL GADAVIST GADOBUTROL 1 MMOL/ML IV SOLN

[Series 1: T2 · axial · 3.0mm · 0.78mm/px · z∈[-64,-0]mm · 3 of 17 slices shown (1 of 4)]
[im 1/17]
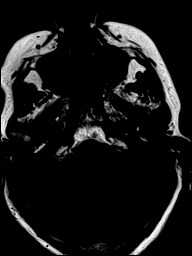
[im 9/17]
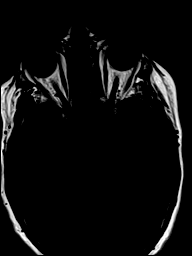
[im 17/17]
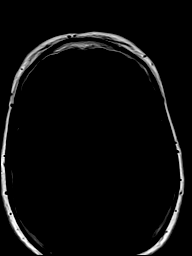

[Series 2: T2 · axial · 3.0mm · 0.78mm/px · z∈[-64,-0]mm · 3 of 17 slices shown (2 of 4)]
[im 1/17]
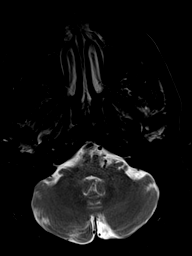
[im 9/17]
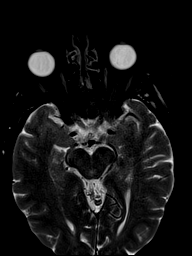
[im 17/17]
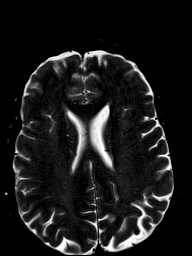

[Series 3: T2 · coronal · 3.0mm · 0.78mm/px · 5 of 35 slices shown (3 of 4)]
[im 1/35]
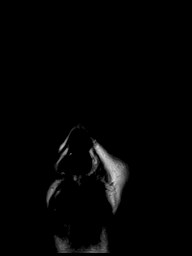
[im 9/35]
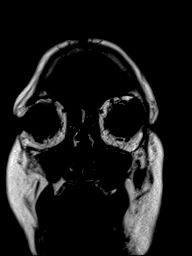
[im 18/35]
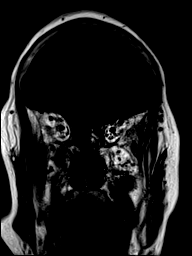
[im 26/35]
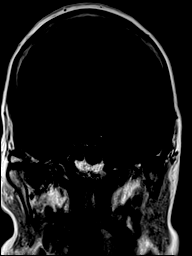
[im 35/35]
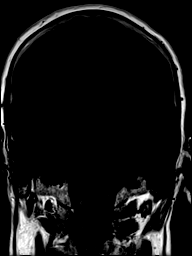

[Series 4: T2 · coronal · 3.0mm · 0.78mm/px · 5 of 35 slices shown (4 of 4)]
[im 1/35]
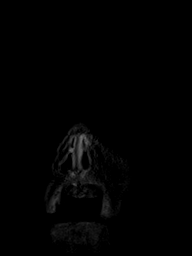
[im 9/35]
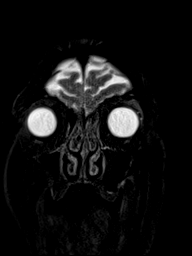
[im 18/35]
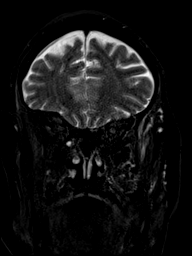
[im 26/35]
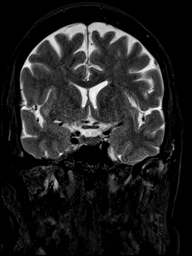
[im 35/35]
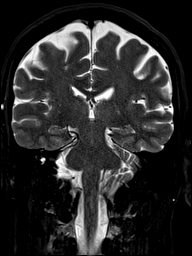

[Series 5: T1 · axial · non-contrast · 3.0mm · 0.31mm/px · z∈[-64,+2]mm · 3 of 23 slices shown (1 of 4)]
[im 1/23]
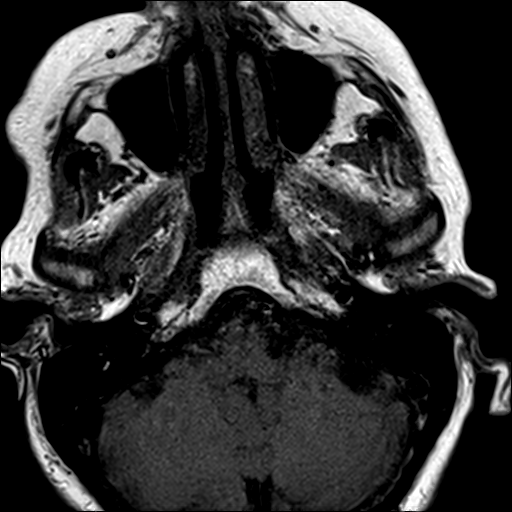
[im 12/23]
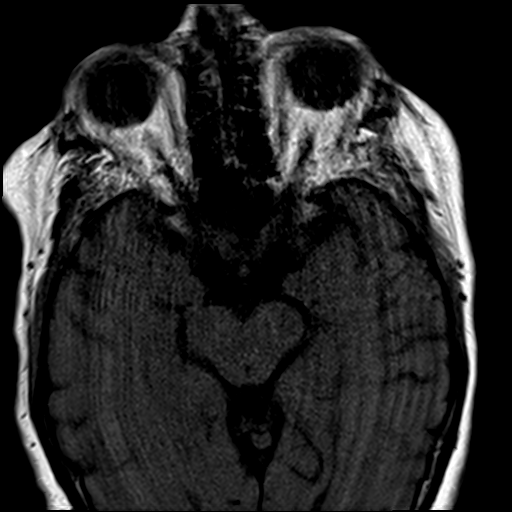
[im 23/23]
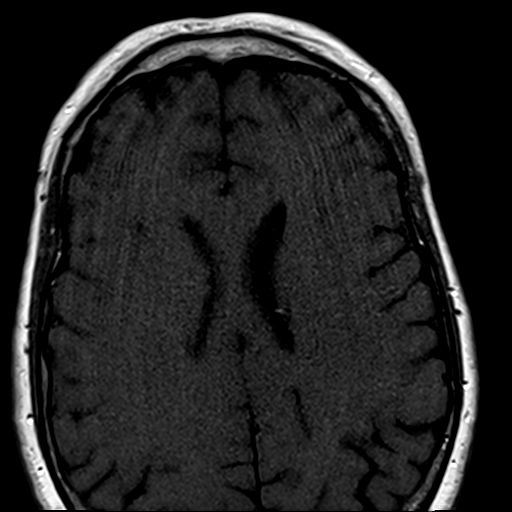

[Series 6: T1 · coronal · non-contrast · 3.0mm · 0.35mm/px · 5 of 40 slices shown (2 of 4)]
[im 1/40]
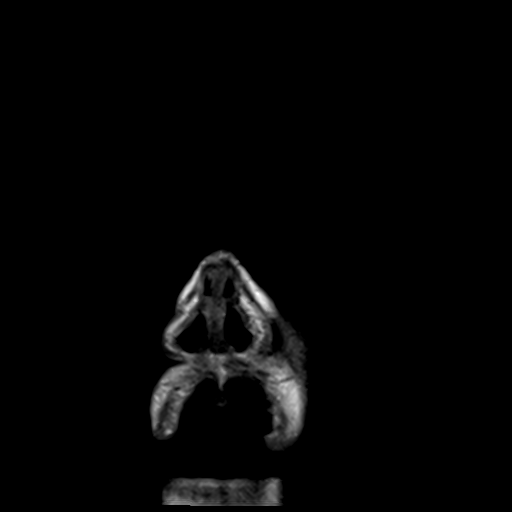
[im 10/40]
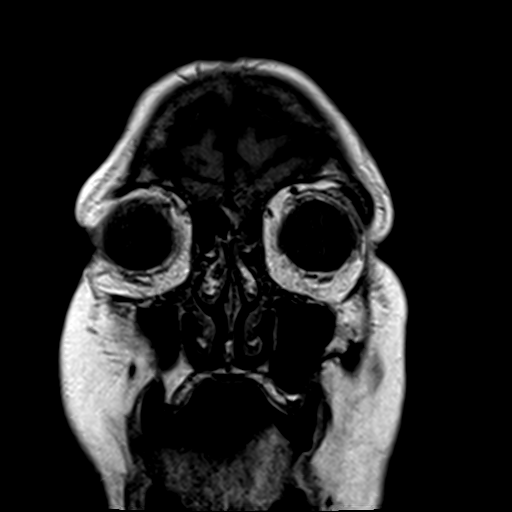
[im 20/40]
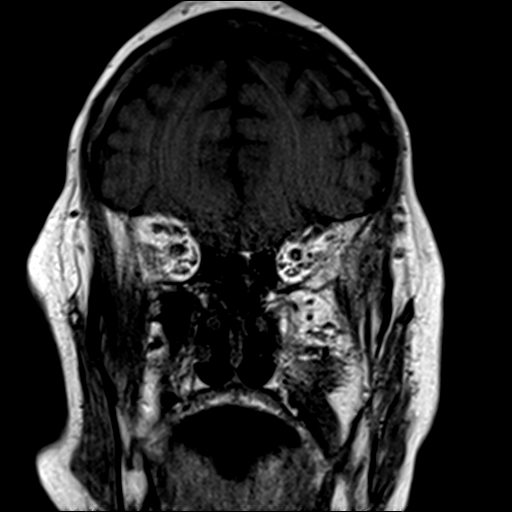
[im 30/40]
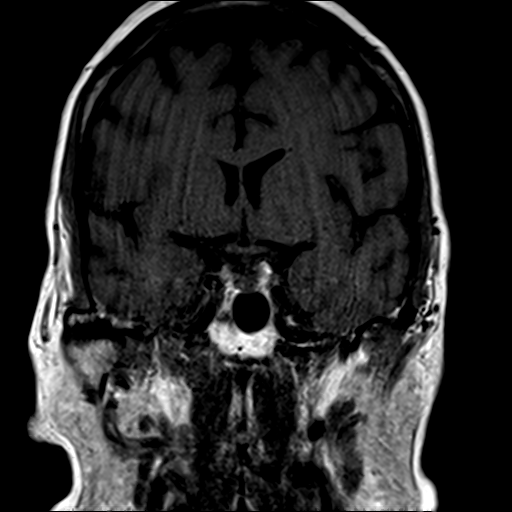
[im 40/40]
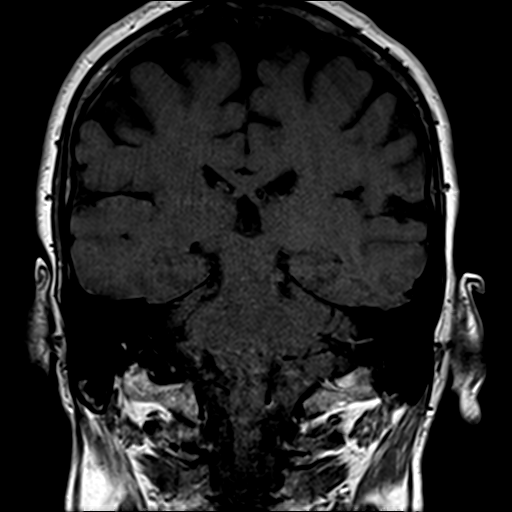

[Series 7: T1 · axial · non-contrast · 3.0mm · 0.31mm/px · z∈[-63,+2]mm · 3 of 23 slices shown (3 of 4)]
[im 1/23]
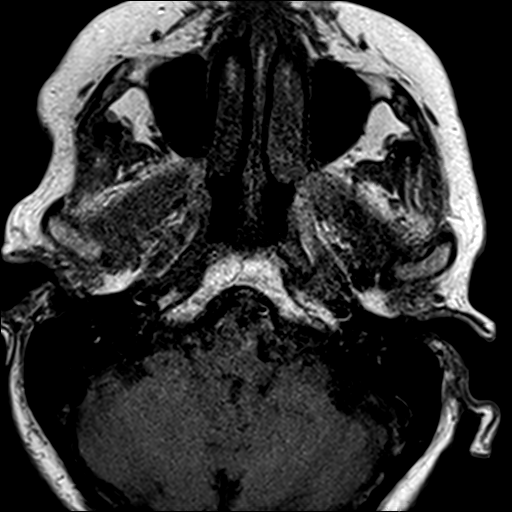
[im 12/23]
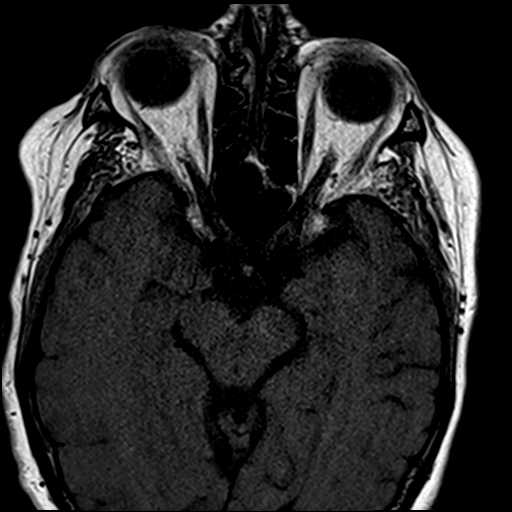
[im 23/23]
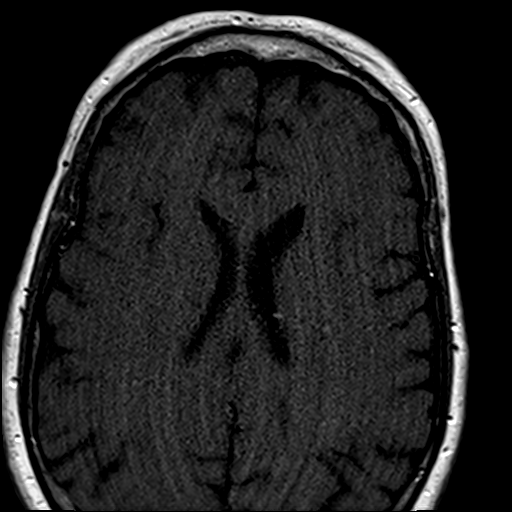

[Series 8: T1 · coronal · non-contrast · 3.0mm · 0.35mm/px · 4 of 40 slices shown (4 of 4)]
[im 1/40]
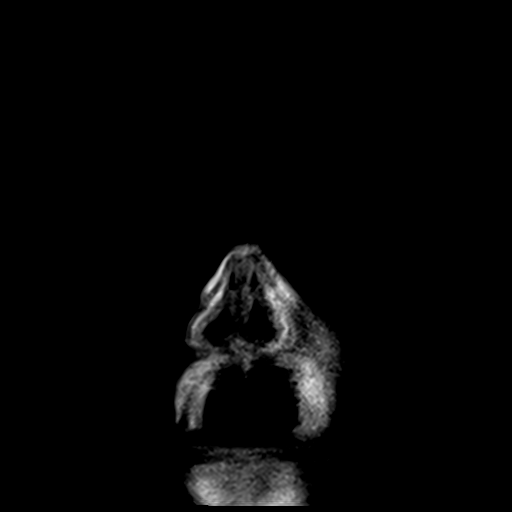
[im 10/40]
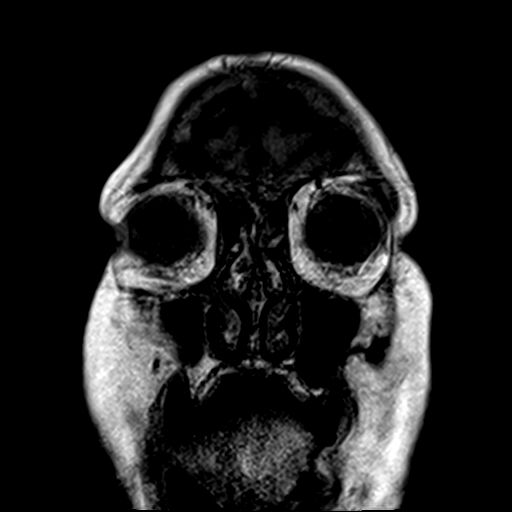
[im 20/40]
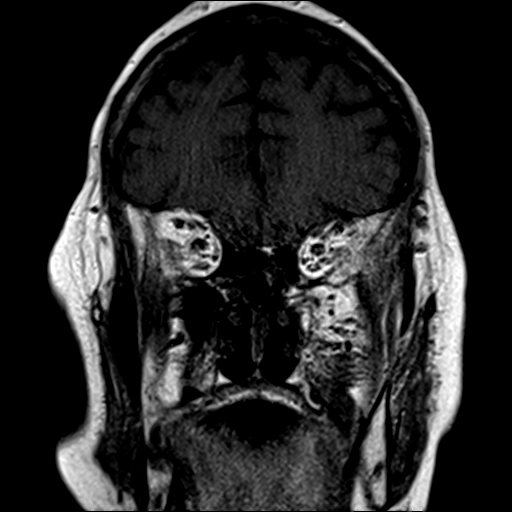
[im 30/40]
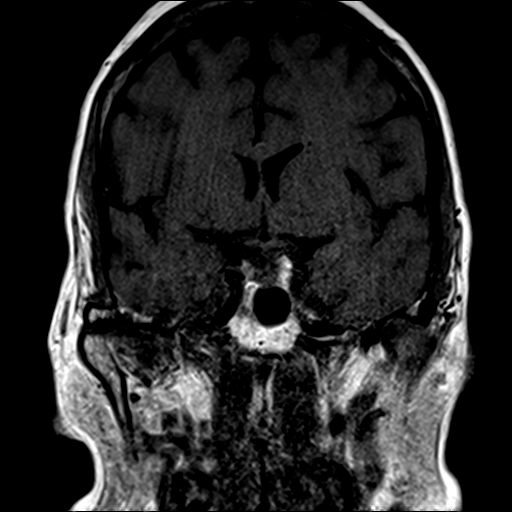

[31 of 48 positions shown; findings below may reference images not displayed]

FINDINGS: MRI HEAD FINDINGS

Evaluation is limited by motion artifact.

Brain: No restricted diffusion to suggest acute or subacute infarct.
No acute hemorrhage, mass, mass effect, or midline shift. No
hemosiderin deposition to suggest remote hemorrhage. No
hydrocephalus or extra-axial collection. Scattered T2 hyperintense
signal in the periventricular white matter, likely the sequela of
minimal chronic small vessel ischemic disease. No abnormal
parenchymal or meningeal enhancement.

Vascular: Normal flow voids and opacified vasculature.

Skull and upper cervical spine: Normal marrow signal.

Other: None.

MRI ORBITS FINDINGS

Evaluation is limited by motion artifact.

Orbits: No traumatic or inflammatory finding. Globes, optic nerves,
orbital fat, extraocular muscles, vascular structures, and lacrimal
glands are normal.

Visualized sinuses: Clear.

Soft tissues: Negative.
IMPRESSION: Evaluation is limited by motion artifact. Within this limitation, no
acute intracranial or orbital process.

## 2023-05-10 IMAGING — MR MR THORACIC SPINE WO/W CM
5 of 9 series · 26 of 48 positions shown · IV contrast (gadavist)
Comparison: MRI thoracic spine 08/18/2021, MRI cervical spine
10/05/2017.

CLINICAL DATA: Left upper extremity weakness

EXAM:
MRI CERVICAL AND THORACIC SPINE WITHOUT AND WITH CONTRAST
TECHNIQUE: Multiplanar and multiecho pulse sequences of the cervical spine, to
include the craniocervical junction and cervicothoracic junction,
and the thoracic spine, were obtained without and with intravenous
contrast.
CONTRAST:  10mL GADAVIST GADOBUTROL 1 MMOL/ML IV SOLN

[Series 16: T1 · sagittal · 5.0mm · 1.41mm/px · 3 of 10 slices shown (1 of 3)]
[im 1/10]
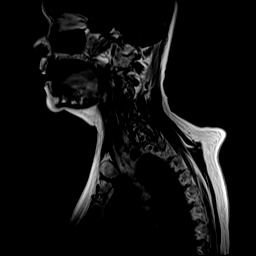
[im 5/10]
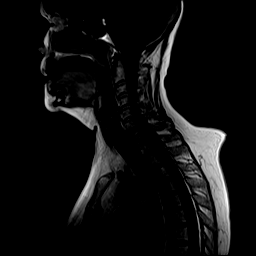
[im 10/10]
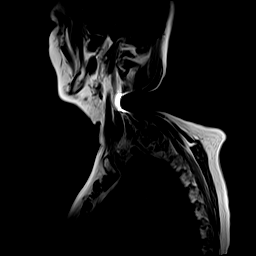

[Series 17: T2 · sagittal · 3.0mm · 1.06mm/px · 4 of 18 slices shown (1 of 2)]
[im 1/18]
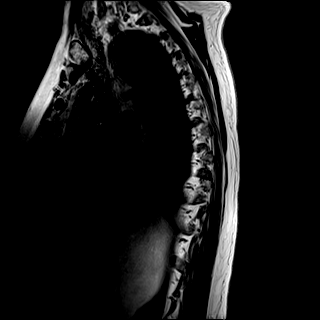
[im 6/18]
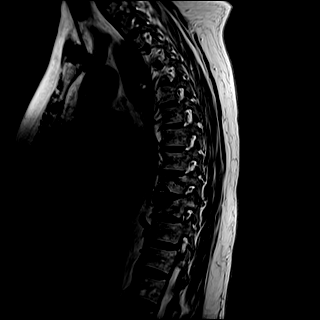
[im 12/18]
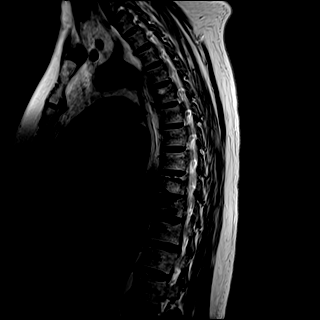
[im 18/18]
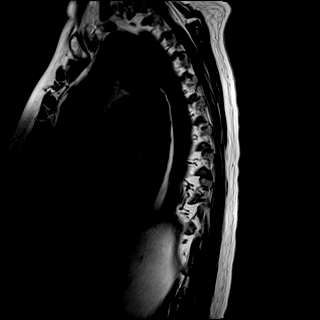

[Series 18: T1 · sagittal · 3.0mm · 1.06mm/px · 3 of 18 slices shown (2 of 3)]
[im 1/18]
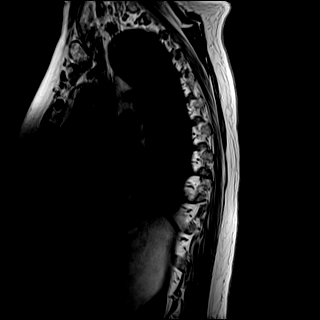
[im 9/18]
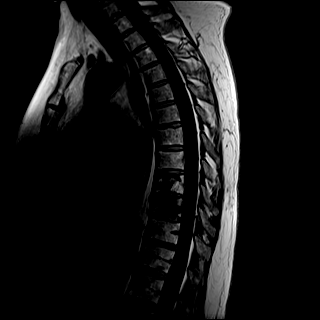
[im 18/18]
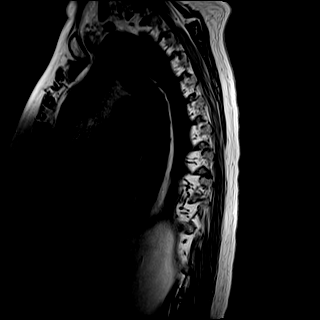

[Series 20: T2 · axial · 4.0mm · 0.59mm/px · z∈[-158,+58]mm · 8 of 42 slices shown (2 of 2)]
[im 1/42]
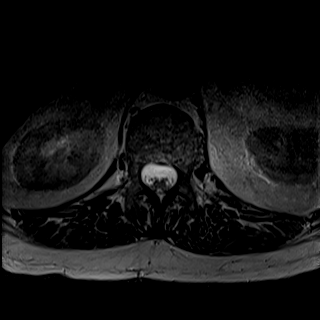
[im 6/42]
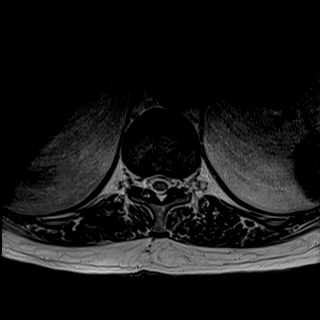
[im 12/42]
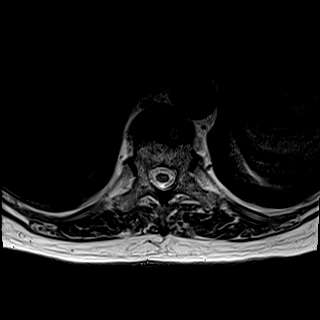
[im 18/42]
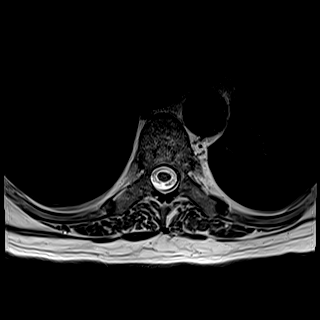
[im 24/42]
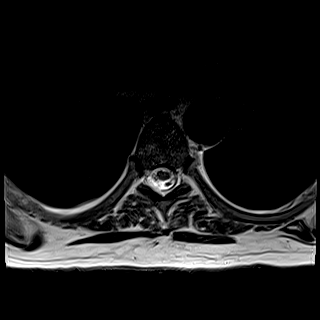
[im 30/42]
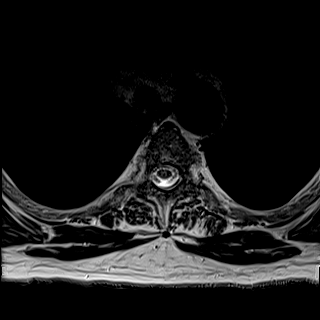
[im 36/42]
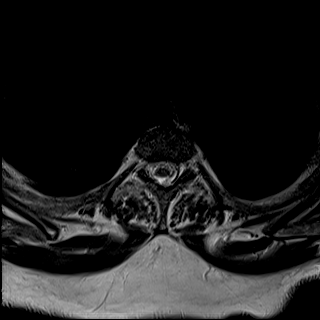
[im 42/42]
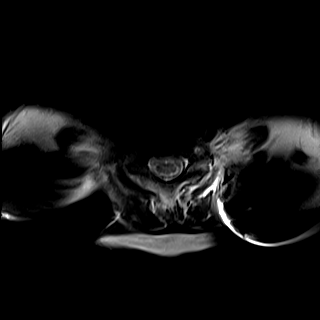

[Series 22: T1 · axial · non-contrast · 4.0mm · 0.31mm/px · z∈[-158,+58]mm · 8 of 42 slices shown (3 of 3)]
[im 1/42]
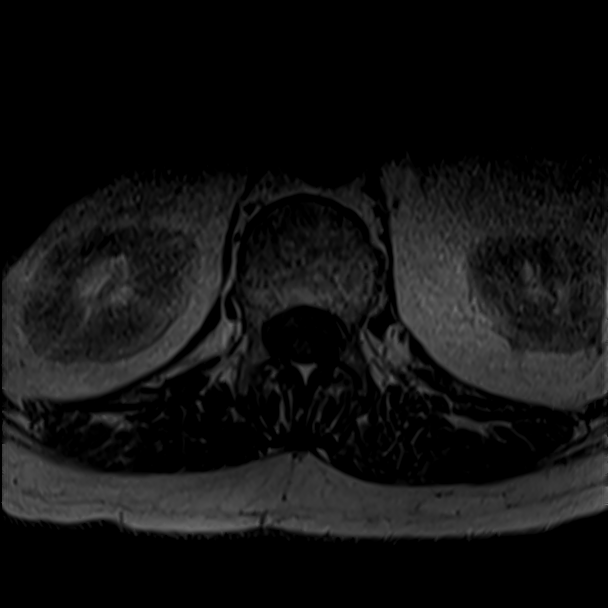
[im 6/42]
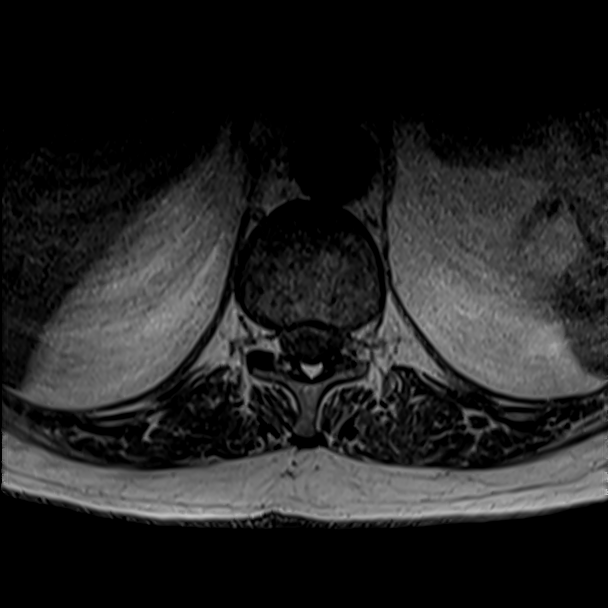
[im 12/42]
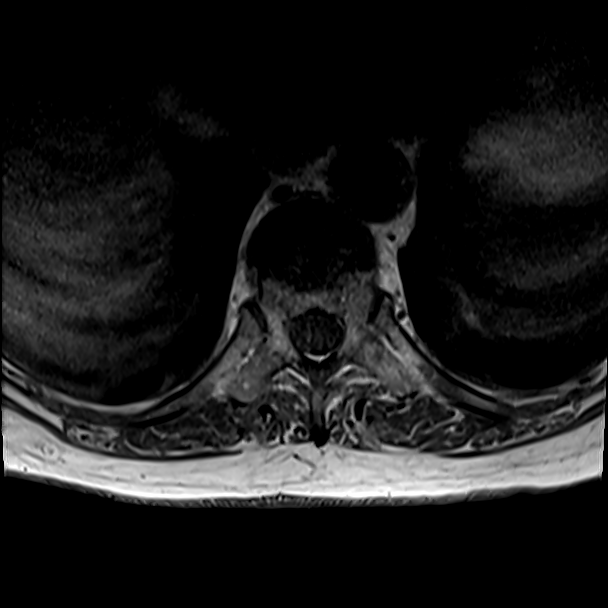
[im 18/42]
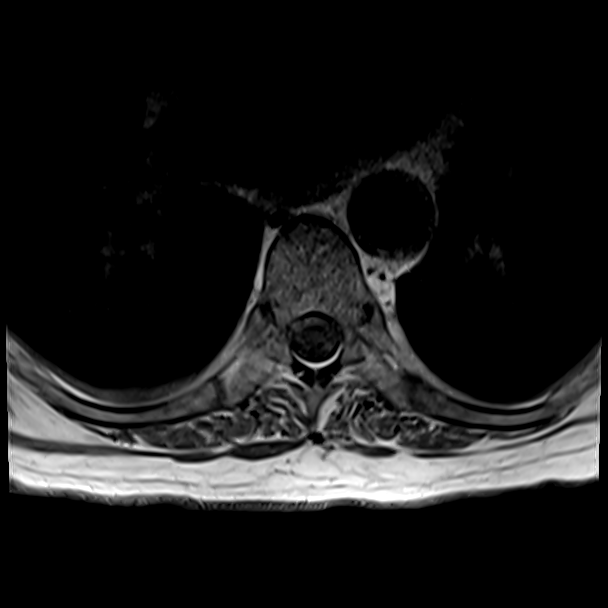
[im 24/42]
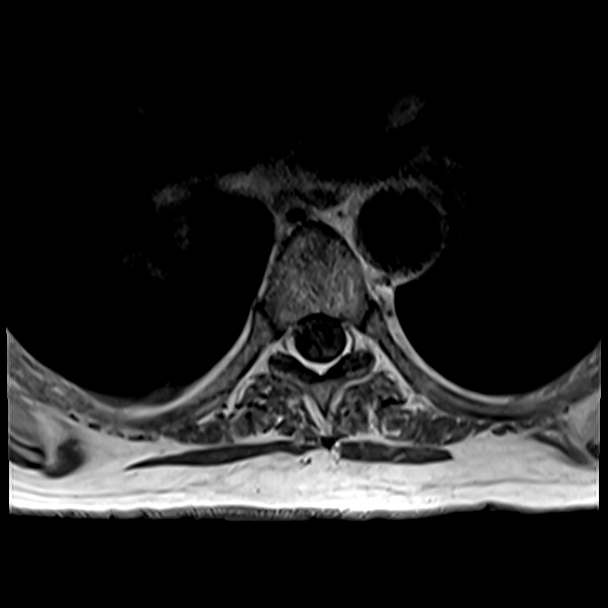
[im 30/42]
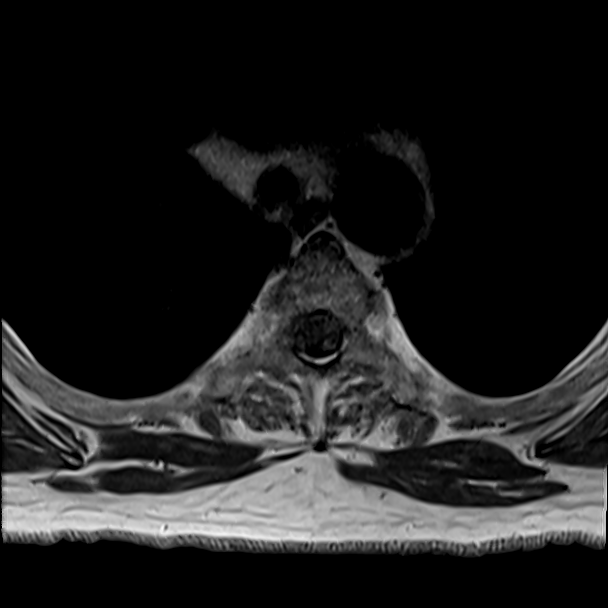
[im 36/42]
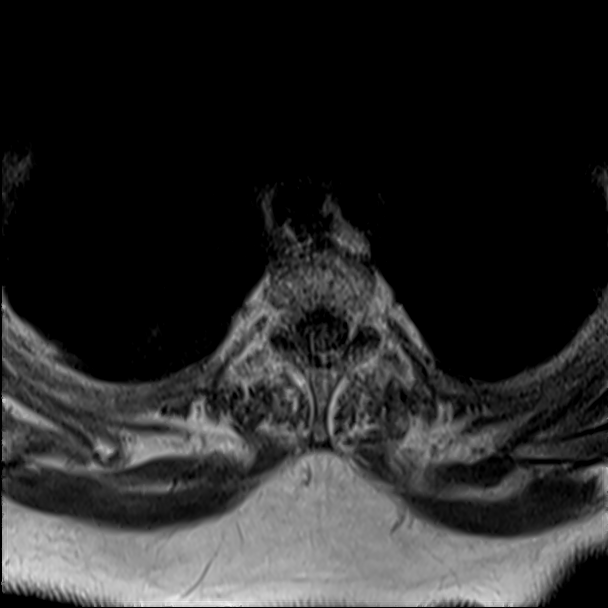
[im 42/42]
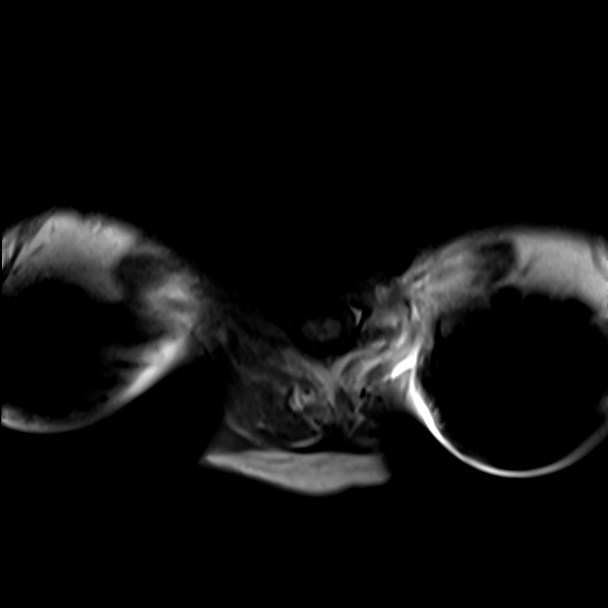

[26 of 48 positions shown; findings below may reference images not displayed]

FINDINGS: MRI CERVICAL SPINE FINDINGS

Evaluation is somewhat limited by motion artifact.

Alignment: Trace anterolisthesis C3 on C4, unchanged.

Vertebrae: No acute fracture or suspicious osseous lesion. No
abnormal enhancement.

Cord: Normal signal and morphology.  No abnormal enhancement.

Posterior Fossa, vertebral arteries, paraspinal tissues: Negative.

Disc levels:

C2-C3: No significant disc bulge. Mild facet arthropathy. No spinal
canal stenosis or neuroforaminal narrowing.

C3-C4: Trace anterolisthesis. Left-greater-than-right facet and
uncovertebral hypertrophy. No spinal canal stenosis. Moderate left
neural foraminal narrowing, unchanged.

C4-C5: No significant disc bulge. Facet and uncovertebral
hypertrophy. No spinal canal stenosis. Mild left-greater-than-right
neural foraminal narrowing.

C5-C6: Minimal disc bulge. Facet and uncovertebral hypertrophy. No
spinal canal stenosis. Mild bilateral neural foraminal narrowing.

C6-C7: No significant disc bulge. Left-greater-than-right facet and
uncovertebral hypertrophy. No spinal canal stenosis. Mild left
neural foraminal narrowing.

C7-T1: No significant disc bulge. No spinal canal stenosis or
neuroforaminal narrowing.

MRI THORACIC SPINE FINDINGS

Alignment:  Physiologic.

Vertebrae: Redemonstrated compression deformities of T9 and T10,
which are both status post kyphoplasty, without additional vertebral
body height loss. Persistent edema is noted in these vertebral
bodies, with increased T2 signal and enhancement, although it is
decreased compared to the prior exam. No acute fracture. No abnormal
enhancement.

Cord:  Normal signal and morphology.  No abnormal enhancement.

Paraspinal and other soft tissues: Negative.

Disc levels:

No significant spinal canal stenosis. Small disc bulges, the most
prominent of which is a left subarticular protrusion at T11-T12,
unchanged. No significant neural foraminal narrowing.
IMPRESSION: 1. No acute fracture in the cervical or thoracic spine.
Redemonstrated compression deformities of T9 and T10, status post
kyphoplasty, without additional vertebral body height loss but with
some persistent edema.
2. No spinal canal stenosis in the cervical or thoracic spine.
3. Multilevel uncovertebral and facet hypertrophy in the cervical
spine, which causes moderate left neural foraminal narrowing at
C3-C4, mild bilateral neural foraminal narrowing at C4-C5 and C5-C6,
and mild left neural foraminal narrowing at C6-C7, which appear
overall unchanged compared to 10/05/2017.

## 2023-05-10 IMAGING — MR MR CERVICAL SPINE WO/W CM
6 of 9 series · 29 of 48 positions shown · IV contrast (gadavist)
Comparison: MRI thoracic spine 08/18/2021, MRI cervical spine
10/05/2017.

CLINICAL DATA: Left upper extremity weakness

EXAM:
MRI CERVICAL AND THORACIC SPINE WITHOUT AND WITH CONTRAST
TECHNIQUE: Multiplanar and multiecho pulse sequences of the cervical spine, to
include the craniocervical junction and cervicothoracic junction,
and the thoracic spine, were obtained without and with intravenous
contrast.
CONTRAST:  10mL GADAVIST GADOBUTROL 1 MMOL/ML IV SOLN

[Series 1: T2 · sagittal · 3.0mm · 0.62mm/px · 4 of 16 slices shown (1 of 2)]
[im 1/16]
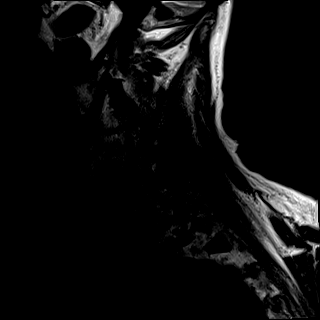
[im 6/16]
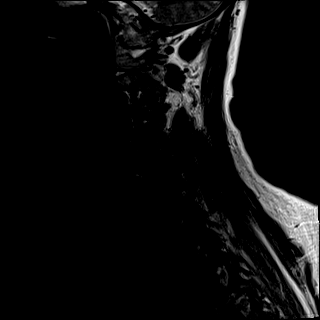
[im 11/16]
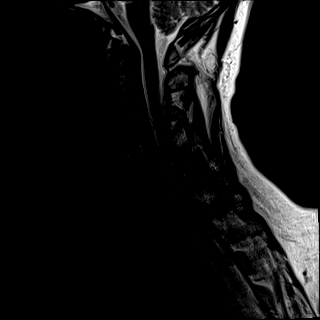
[im 16/16]
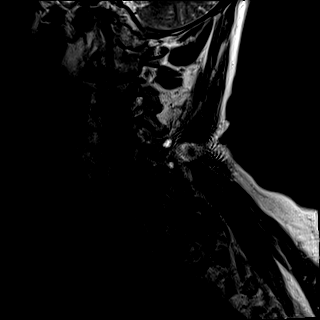

[Series 3: STIR · sagittal · 3.0mm · 0.62mm/px · 4 of 16 slices shown (1 of 2)]
[im 1/16]
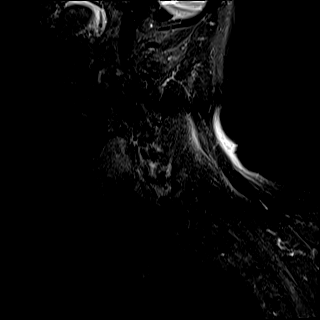
[im 6/16]
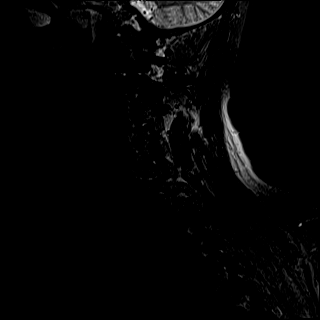
[im 11/16]
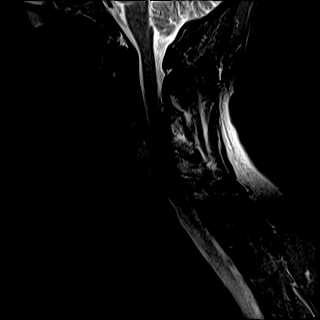
[im 16/16]
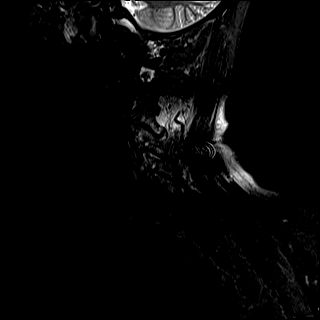

[Series 4: T2 · axial · 3.0mm · 0.70mm/px · z∈[+58,+153]mm · 7 of 30 slices shown (2 of 2)]
[im 1/30]
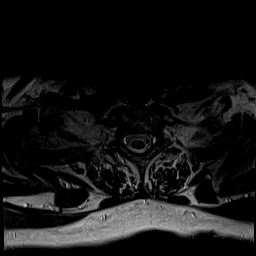
[im 5/30]
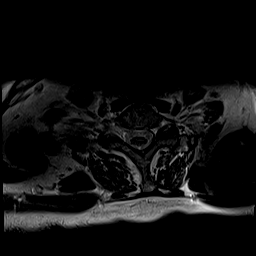
[im 10/30]
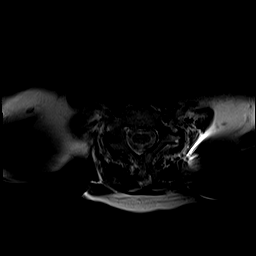
[im 15/30]
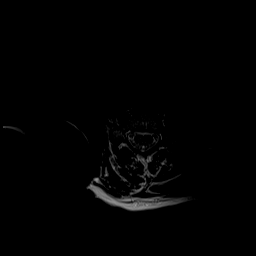
[im 20/30]
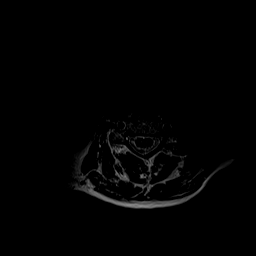
[im 25/30]
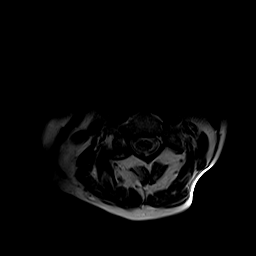
[im 30/30]
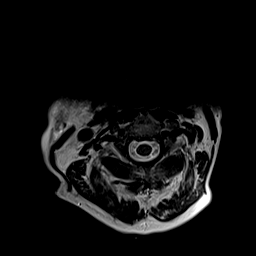

[Series 5: STIR · sagittal · 3.0mm · 0.62mm/px · 4 of 16 slices shown (2 of 2)]
[im 1/16]
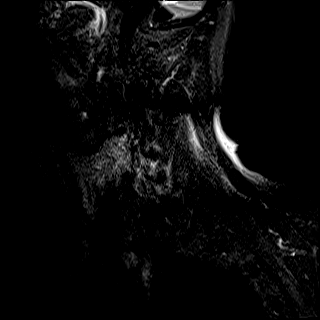
[im 6/16]
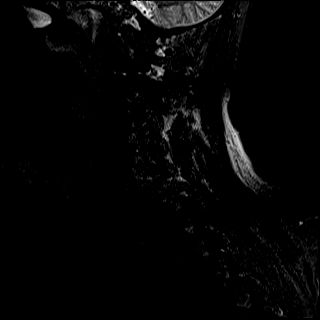
[im 11/16]
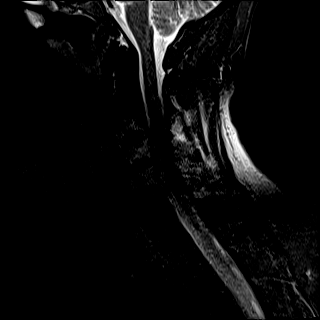
[im 16/16]
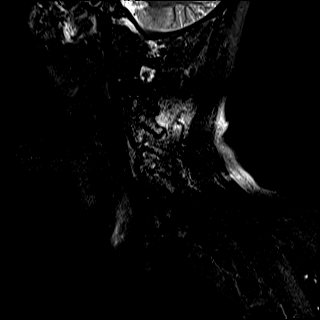

[Series 8: T1 · axial · non-contrast · 3.0mm · 0.35mm/px · z∈[+58,+153]mm · 7 of 30 slices shown]
[im 1/30]
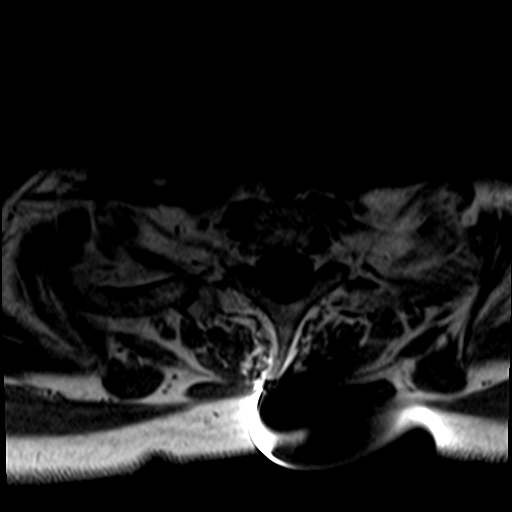
[im 5/30]
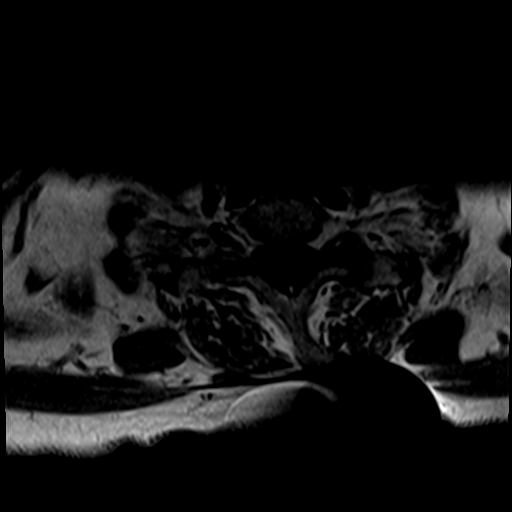
[im 10/30]
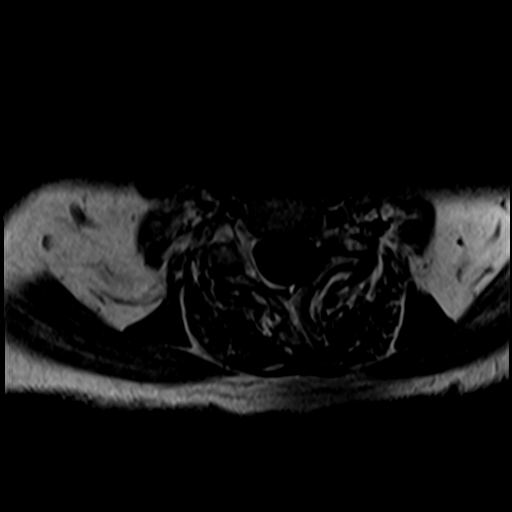
[im 15/30]
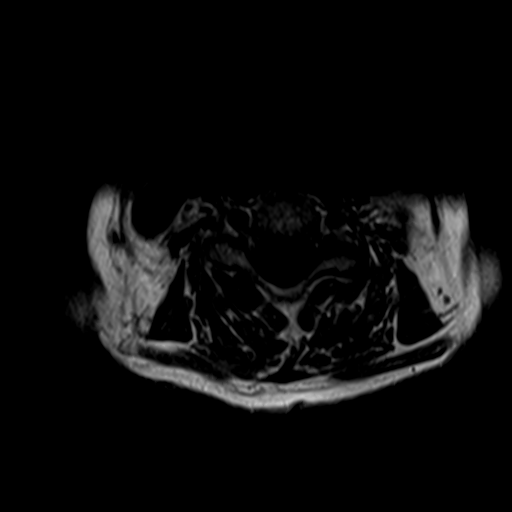
[im 20/30]
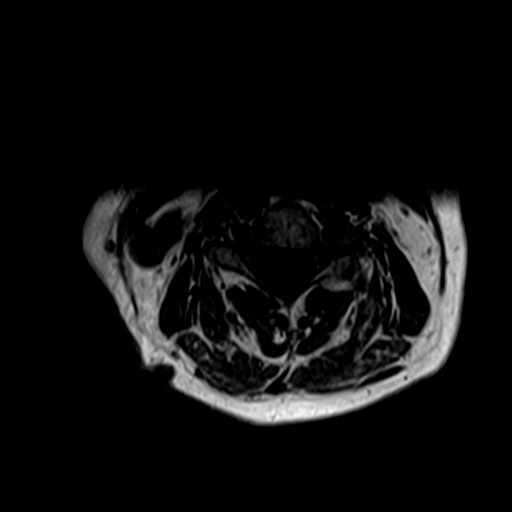
[im 25/30]
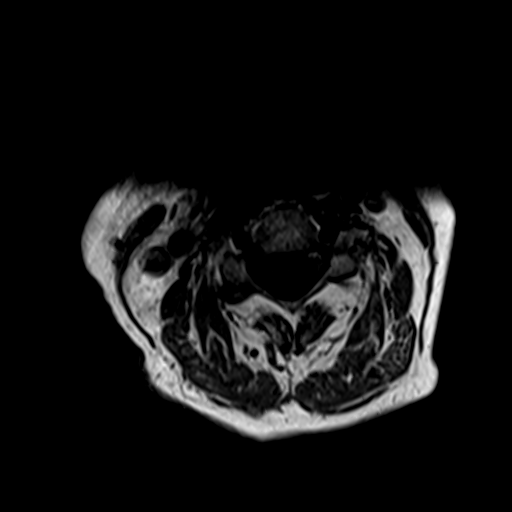
[im 30/30]
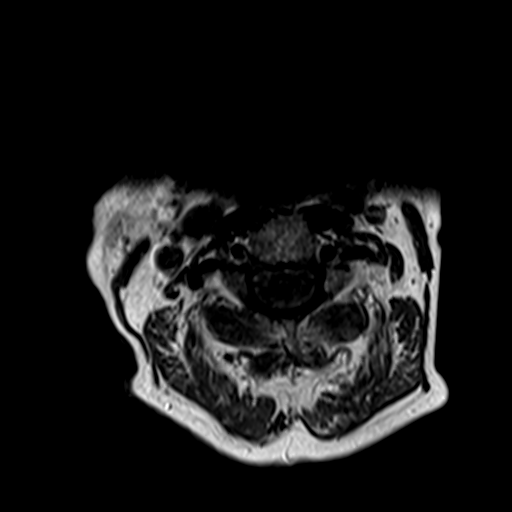

[Series 10: T1 post-contrast · axial · 3.0mm · 0.35mm/px · z∈[+58,+88]mm · 3 of 30 slices shown]
[im 1/30]
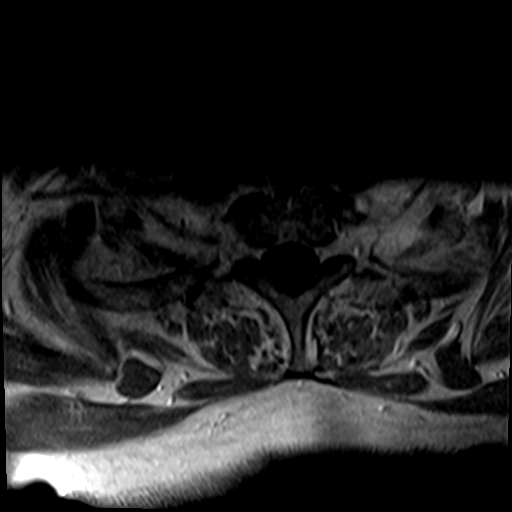
[im 5/30]
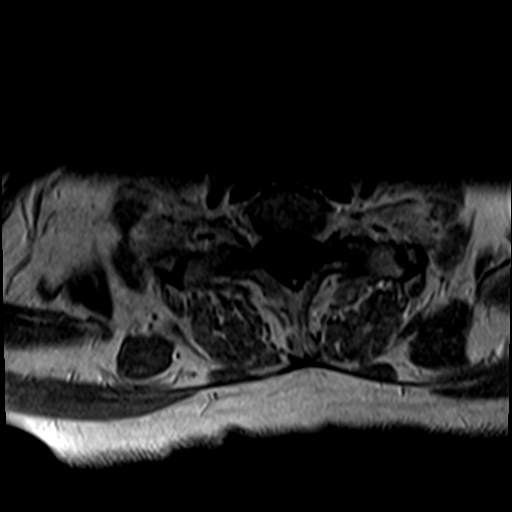
[im 10/30]
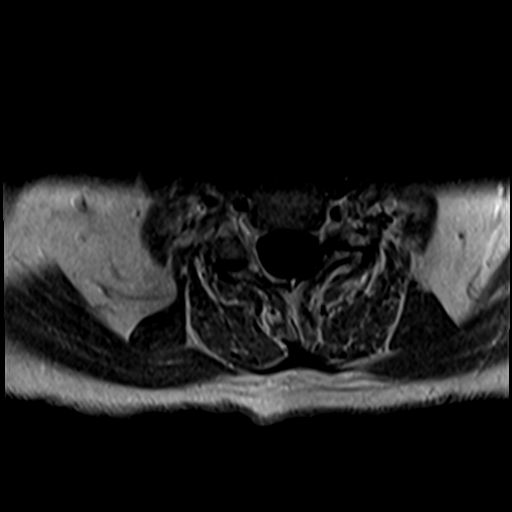

[29 of 48 positions shown; findings below may reference images not displayed]

FINDINGS: MRI CERVICAL SPINE FINDINGS

Evaluation is somewhat limited by motion artifact.

Alignment: Trace anterolisthesis C3 on C4, unchanged.

Vertebrae: No acute fracture or suspicious osseous lesion. No
abnormal enhancement.

Cord: Normal signal and morphology.  No abnormal enhancement.

Posterior Fossa, vertebral arteries, paraspinal tissues: Negative.

Disc levels:

C2-C3: No significant disc bulge. Mild facet arthropathy. No spinal
canal stenosis or neuroforaminal narrowing.

C3-C4: Trace anterolisthesis. Left-greater-than-right facet and
uncovertebral hypertrophy. No spinal canal stenosis. Moderate left
neural foraminal narrowing, unchanged.

C4-C5: No significant disc bulge. Facet and uncovertebral
hypertrophy. No spinal canal stenosis. Mild left-greater-than-right
neural foraminal narrowing.

C5-C6: Minimal disc bulge. Facet and uncovertebral hypertrophy. No
spinal canal stenosis. Mild bilateral neural foraminal narrowing.

C6-C7: No significant disc bulge. Left-greater-than-right facet and
uncovertebral hypertrophy. No spinal canal stenosis. Mild left
neural foraminal narrowing.

C7-T1: No significant disc bulge. No spinal canal stenosis or
neuroforaminal narrowing.

MRI THORACIC SPINE FINDINGS

Alignment:  Physiologic.

Vertebrae: Redemonstrated compression deformities of T9 and T10,
which are both status post kyphoplasty, without additional vertebral
body height loss. Persistent edema is noted in these vertebral
bodies, with increased T2 signal and enhancement, although it is
decreased compared to the prior exam. No acute fracture. No abnormal
enhancement.

Cord:  Normal signal and morphology.  No abnormal enhancement.

Paraspinal and other soft tissues: Negative.

Disc levels:

No significant spinal canal stenosis. Small disc bulges, the most
prominent of which is a left subarticular protrusion at T11-T12,
unchanged. No significant neural foraminal narrowing.
IMPRESSION: 1. No acute fracture in the cervical or thoracic spine.
Redemonstrated compression deformities of T9 and T10, status post
kyphoplasty, without additional vertebral body height loss but with
some persistent edema.
2. No spinal canal stenosis in the cervical or thoracic spine.
3. Multilevel uncovertebral and facet hypertrophy in the cervical
spine, which causes moderate left neural foraminal narrowing at
C3-C4, mild bilateral neural foraminal narrowing at C4-C5 and C5-C6,
and mild left neural foraminal narrowing at C6-C7, which appear
overall unchanged compared to 10/05/2017.

## 2023-05-18 ENCOUNTER — Other Ambulatory Visit: Payer: Self-pay | Admitting: Family

## 2023-05-18 DIAGNOSIS — F411 Generalized anxiety disorder: Secondary | ICD-10-CM

## 2023-05-18 DIAGNOSIS — F331 Major depressive disorder, recurrent, moderate: Secondary | ICD-10-CM

## 2023-05-18 DIAGNOSIS — R112 Nausea with vomiting, unspecified: Secondary | ICD-10-CM

## 2023-05-20 ENCOUNTER — Ambulatory Visit (INDEPENDENT_AMBULATORY_CARE_PROVIDER_SITE_OTHER): Payer: Medicare HMO | Admitting: Family

## 2023-05-20 ENCOUNTER — Encounter: Payer: Self-pay | Admitting: Family

## 2023-05-20 VITALS — BP 128/64 | HR 68 | Temp 97.6°F | Ht 72.0 in | Wt 181.6 lb

## 2023-05-20 DIAGNOSIS — H9313 Tinnitus, bilateral: Secondary | ICD-10-CM | POA: Diagnosis not present

## 2023-05-20 DIAGNOSIS — S0096XA Insect bite (nonvenomous) of unspecified part of head, initial encounter: Secondary | ICD-10-CM | POA: Insufficient documentation

## 2023-05-20 DIAGNOSIS — W57XXXA Bitten or stung by nonvenomous insect and other nonvenomous arthropods, initial encounter: Secondary | ICD-10-CM | POA: Diagnosis not present

## 2023-05-20 DIAGNOSIS — Z87898 Personal history of other specified conditions: Secondary | ICD-10-CM | POA: Diagnosis not present

## 2023-05-20 DIAGNOSIS — R197 Diarrhea, unspecified: Secondary | ICD-10-CM | POA: Diagnosis not present

## 2023-05-20 DIAGNOSIS — F331 Major depressive disorder, recurrent, moderate: Secondary | ICD-10-CM

## 2023-05-20 DIAGNOSIS — R112 Nausea with vomiting, unspecified: Secondary | ICD-10-CM

## 2023-05-20 DIAGNOSIS — F411 Generalized anxiety disorder: Secondary | ICD-10-CM | POA: Diagnosis not present

## 2023-05-20 DIAGNOSIS — F4323 Adjustment disorder with mixed anxiety and depressed mood: Secondary | ICD-10-CM

## 2023-05-20 DIAGNOSIS — S0086XA Insect bite (nonvenomous) of other part of head, initial encounter: Secondary | ICD-10-CM | POA: Diagnosis not present

## 2023-05-20 MED ORDER — PROMETHAZINE HCL 12.5 MG PO TABS: 12.5000 mg | ORAL_TABLET | Freq: Two times a day (BID) | ORAL | 0 refills | Status: DC | PRN

## 2023-05-20 MED ORDER — HYDROXYZINE HCL 25 MG PO TABS: 25.0000 mg | ORAL_TABLET | Freq: Two times a day (BID) | ORAL | 1 refills | Status: DC | PRN

## 2023-05-20 MED ORDER — DOXYCYCLINE HYCLATE 100 MG PO TABS: 100.0000 mg | ORAL_TABLET | Freq: Two times a day (BID) | ORAL | 0 refills | Status: AC

## 2023-05-20 NOTE — Patient Instructions (Signed)
  A referral was placed today for ent for your tinnitus  Please let us know if you have not heard back within 2 weeks about the referral.  Please monitor site for worsening signs/symptoms of infection to include: increasing redness, increasing tenderness, increase in size, and or pustulant drainage from site. If this is to occur please let me know immediately.    Regards,   Mort Sawyers FNP-C

## 2023-05-20 NOTE — Assessment & Plan Note (Signed)
Appearing on PE with cellulitis  Rx RX doxycycline 100 mg po bid x 10 days Please monitor site for worsening signs/symptoms of infection to include: increasing redness, increasing tenderness, increase in size, and or pustulant drainage from site. If this is to occur please let me know immediately.

## 2023-05-20 NOTE — Assessment & Plan Note (Signed)
Worsening Referral placed for ent to eval/treat

## 2023-05-20 NOTE — Progress Notes (Signed)
Established Patient Office Visit  Subjective:      CC:  Chief Complaint  Patient presents with   Insect Bite    Tick bite on back right side of her head, there's swelling.    HPI: Morgan [name] is a 72 y.o. female presenting on 05/20/2023 for Insect Bite (Tick bite on back right side of her head, there's swelling.) . GAD: slightly improved because her grandson is acting a little better without as much outbursts as before. On lexapro 20 mg once daily. Using hydroxyzine as well. Feels doing ok with this. Not currently seeing a hterapist.   Chronic diarrhea and nausea: pending colonoscopy July 2nd , on phenergan pretty regularly to maintain the nausea.   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 Has not ever been evaluated by ENT, worse in left > right, getting progressively worse.   New complaints: Tick bite, was mowing the yard and felt a tick on the right back side of her head. Pulled off a deer tick, states since this occurred she feels a knot at the base of her neck and increasing in size with tenderness, mild. Took it off within 24 hours.      Social history:  Relevant past medical, surgical, family and social history reviewed and updated as indicated. Interim medical history since our last visit reviewed.  Allergies and medications reviewed and updated.  DATA REVIEWED: CHART IN EPIC     ROS: Negative unless specifically indicated above in HPI.    Current Outpatient Medications:    acetaminophen (TYLENOL) 325 MG tablet, Take 2 tablets (650 mg total) by mouth every 4 (four) hours as needed for mild pain (or temp > 37.5 C (99.5 F))., Disp: , Rfl:    albuterol (VENTOLIN HFA) 108 (90 Base) MCG/ACT inhaler, Inhale 2 puffs into the lungs every 6 (six) hours as needed for wheezing or shortness of breath., Disp: 1 each, Rfl: 11   Budeson-Glycopyrrol-Formoterol (BREZTRI AEROSPHERE) 160-9-4.8 MCG/ACT AERO, Inhale 2 puffs into the lungs in the morning and at bedtime., Disp: 1 each,  Rfl: 11   carbidopa-levodopa (SINEMET IR) 25-100 MG tablet, Take 2 tablets by mouth 3 (three) times daily., Disp: , Rfl:    cyanocobalamin (VITAMIN B12) 100 MCG tablet, Take 100 mcg by mouth daily., Disp: , Rfl:    doxycycline (VIBRA-TABS) 100 MG tablet, Take 1 tablet (100 mg total) by mouth 2 (two) times daily for 7 days., Disp: 14 tablet, Rfl: 0   escitalopram (LEXAPRO) 20 MG tablet, Take 1 tablet (20 mg total) by mouth daily., Disp: 90 tablet, Rfl: 3   folic acid (FOLVITE) 1 MG tablet, TAKE 1 TABLET BY MOUTH EVERY DAY, Disp: 90 tablet, Rfl: 1   ibuprofen (ADVIL) 400 MG tablet, Take 1 tablet (400 mg total) by mouth every 8 (eight) hours as needed for headache or moderate pain., Disp: 20 tablet, Rfl: 0   meloxicam (MOBIC) 15 MG tablet, Take 1 tablet (15 mg total) by mouth daily., Disp: 30 tablet, Rfl: 0   OXYGEN, Inhale 3 L/min into the lungs See admin instructions. 3 L/min at bedtime and as needed for shortness of breath throughout the day, Disp: , Rfl:    pantoprazole (PROTONIX) 20 MG tablet, Take by mouth., Disp: , Rfl:    potassium chloride SA (KLOR-CON M) 20 MEQ tablet, Take 1 tablet (20 mEq total) by mouth daily., Disp: 90 tablet, Rfl: 1   topiramate (TOPAMAX) 50 MG tablet, TAKE 1 TABLET BY MOUTH EVERYDAY AT BEDTIME, Disp: 90  tablet, Rfl: 0   valsartan (DIOVAN) 80 MG tablet, Take 1 tablet (80 mg total) by mouth daily., Disp: 90 tablet, Rfl: 3   hydrOXYzine (ATARAX) 25 MG tablet, Take 1 tablet (25 mg total) by mouth 2 (two) times daily as needed for anxiety or nausea., Disp: 180 tablet, Rfl: 1   promethazine (PHENERGAN) 12.5 MG tablet, Take 1 tablet (12.5 mg total) by mouth every 12 (twelve) hours as needed for nausea or vomiting., Disp: 20 tablet, Rfl: 0      Objective:    BP 128/64   Pulse 68   Temp 97.6 F (36.4 C) (Temporal)   Ht 6' (1.829 m)   Wt 181 lb 9.6 oz (82.4 kg)   SpO2 97%   BMI 24.63 kg/m   Wt Readings from Last 3 Encounters:  05/20/23 181 lb 9.6 oz (82.4 kg)   04/04/23 184 lb 9.6 oz (83.7 kg)  02/20/23 180 lb (81.6 kg)    Physical Exam HENT:     Right Ear: Hearing, tympanic membrane, ear canal and external ear normal.     Left Ear: Hearing, tympanic membrane, ear canal and external ear normal.  Lymphadenopathy:     Head:     Right side of head: Occipital adenopathy present.  Skin:    Comments: Right lower aspect of posterior scalp with puncture site with surrounding erythema approx 1.5 cm diameter              Assessment & Plan:  Tick bite of other part of head, initial encounter Assessment & Plan: Appearing on PE with cellulitis  Rx RX doxycycline 100 mg po bid x 10 days Please monitor site for worsening signs/symptoms of infection to include: increasing redness, increasing tenderness, increase in size, and or pustulant drainage from site. If this is to occur please let me know immediately.    Orders: -     Doxycycline Hyclate; Take 1 tablet (100 mg total) by mouth 2 (two) times daily for 7 days.  Dispense: 14 tablet; Refill: 0  Generalized anxiety disorder -     hydrOXYzine HCl; Take 1 tablet (25 mg total) by mouth 2 (two) times daily as needed for anxiety or nausea.  Dispense: 180 tablet; Refill: 1  Nausea and vomiting in adult -     hydrOXYzine HCl; Take 1 tablet (25 mg total) by mouth 2 (two) times daily as needed for anxiety or nausea.  Dispense: 180 tablet; Refill: 1  Moderate episode of recurrent major depressive disorder (HCC) -     hydrOXYzine HCl; Take 1 tablet (25 mg total) by mouth 2 (two) times daily as needed for anxiety or nausea.  Dispense: 180 tablet; Refill: 1  Nausea vomiting and diarrhea -     Promethazine HCl; Take 1 tablet (12.5 mg total) by mouth every 12 (twelve) hours as needed for nausea or vomiting.  Dispense: 20 tablet; Refill: 0  Bilateral tinnitus Assessment & Plan: Worsening Referral placed for ent to eval/treat  Orders: -     Ambulatory referral to ENT  Adjustment reaction with anxiety  and depression Assessment & Plan: Improving  Continue lexapro 20 mg once daily with hydroxyxine prn refill sent to pharmacy    History of syncope     Return in about 6 months (around 11/19/2023), or if symptoms worsen or fail to improve, for f/u CPE.  Mort Sawyers, MSN, APRN, FNP-C [place] [facility]

## 2023-05-20 NOTE — Assessment & Plan Note (Signed)
Improving  Continue lexapro 20 mg once daily with hydroxyxine prn refill sent to pharmacy

## 2023-05-21 ENCOUNTER — Telehealth: Payer: Self-pay | Admitting: Family

## 2023-05-21 NOTE — Telephone Encounter (Signed)
Patient contacted the office regarding medication promethazine, stated pharmacy had sent medication request back to [place] for an insurance prior authorization. Patient is needing prior auth for medication listed below, please advise, thank you.  promethazine (PHENERGAN) 12.5 MG tablet

## 2023-05-23 ENCOUNTER — Encounter: Payer: Self-pay | Admitting: Oncology

## 2023-05-23 ENCOUNTER — Other Ambulatory Visit (HOSPITAL_COMMUNITY): Payer: Self-pay

## 2023-05-23 ENCOUNTER — Telehealth: Payer: Self-pay | Admitting: Pharmacy Technician

## 2023-05-23 NOTE — Telephone Encounter (Signed)
Patient Advocate Encounter  Prior Authorization for Promethazine HCl 12.5MG  tablets has been approved with [employer].    PA# [id number] Effective dates: 12/17/22 through 12/17/23

## 2023-05-23 NOTE — Telephone Encounter (Signed)
PA approved. Effective dates: 12/17/22 through 12/17/23. Please see separate encounter on 05/23/23 for more information.

## 2023-05-23 NOTE — Telephone Encounter (Signed)
Called and left a detailed message advising this medication was approved and to contact the pharmacy to fill this prescription.

## 2023-05-23 NOTE — Telephone Encounter (Signed)
Patient Advocate Encounter   Received notification that prior authorization for Promethazine HCl 12.5MG  tablets is required.   PA submitted on 05/23/2023 Key [id number] Insurance [employer] Electronic PA Form Status is pending

## 2023-05-24 NOTE — Telephone Encounter (Signed)
Patient Advocate Encounter  Prior Authorization for Promethazine HCl 12.5MG  tablets has been approved.    PA# [id number] Insurance [employer] Electronic PA Form Effective dates: 05/23/2023 through 12/17/2023

## 2023-05-26 DIAGNOSIS — J449 Chronic obstructive pulmonary disease, unspecified: Secondary | ICD-10-CM | POA: Diagnosis not present

## 2023-05-26 DIAGNOSIS — I509 Heart failure, unspecified: Secondary | ICD-10-CM | POA: Diagnosis not present

## 2023-05-27 ENCOUNTER — Encounter: Payer: Self-pay | Admitting: Family

## 2023-05-27 ENCOUNTER — Ambulatory Visit (INDEPENDENT_AMBULATORY_CARE_PROVIDER_SITE_OTHER): Payer: Medicare HMO | Admitting: Family

## 2023-05-27 VITALS — BP 132/76 | HR 72 | Temp 97.6°F | Ht 72.0 in | Wt 182.6 lb

## 2023-05-27 DIAGNOSIS — W57XXXA Bitten or stung by nonvenomous insect and other nonvenomous arthropods, initial encounter: Secondary | ICD-10-CM

## 2023-05-27 DIAGNOSIS — R519 Headache, unspecified: Secondary | ICD-10-CM | POA: Diagnosis not present

## 2023-05-27 DIAGNOSIS — H539 Unspecified visual disturbance: Secondary | ICD-10-CM | POA: Insufficient documentation

## 2023-05-27 DIAGNOSIS — J0110 Acute frontal sinusitis, unspecified: Secondary | ICD-10-CM

## 2023-05-27 DIAGNOSIS — H538 Other visual disturbances: Secondary | ICD-10-CM | POA: Insufficient documentation

## 2023-05-27 DIAGNOSIS — S0096XA Insect bite (nonvenomous) of unspecified part of head, initial encounter: Secondary | ICD-10-CM | POA: Diagnosis not present

## 2023-05-27 LAB — CBC
HCT: 34.2 % — ABNORMAL LOW (ref 36.0–46.0)
Hemoglobin: 11 g/dL — ABNORMAL LOW (ref 12.0–15.0)
MCHC: 32.1 g/dL (ref 30.0–36.0)
MCV: 86.5 fl (ref 78.0–100.0)
Platelets: 235 10*3/uL (ref 150.0–400.0)
RBC: 3.95 Mil/uL (ref 3.87–5.11)
RDW: 14.7 % (ref 11.5–15.5)
WBC: 7 10*3/uL (ref 4.0–10.5)

## 2023-05-27 LAB — C-REACTIVE PROTEIN: CRP: <1 mg/dL (ref 0.5–20.0)

## 2023-05-27 LAB — SEDIMENTATION RATE: Sed Rate: 11 mm/hr (ref 0–30)

## 2023-05-27 MED ORDER — PREDNISONE 20 MG PO TABS
ORAL_TABLET | ORAL | 0 refills | Status: DC
Start: 2023-05-27 — End: 2023-06-22

## 2023-05-27 MED ORDER — AMOXICILLIN-POT CLAVULANATE 875-125 MG PO TABS: 1.0000 | ORAL_TABLET | Freq: Two times a day (BID) | ORAL | 0 refills | Status: DC

## 2023-05-27 NOTE — Progress Notes (Signed)
Established Patient Office Visit  Subjective:   Patient ID: Morgan [name], female    DOB: February 06, 1951  Age: 72 y.o. MRN: [id number]  CC:  Chief Complaint  Patient presents with   Conjunctivitis   Headache    From eye pain   Tick Removal    Had a tick bite last week and another one on her stomach recently.    HPI: Morgan [name] is a 72 y.o. female presenting on 05/27/2023 for Conjunctivitis, Headache (From eye pain), and Tick Removal (Had a tick bite last week and another one on her stomach recently.)   Conjunctivitis  Associated symptoms include headaches.  Headache     Woke up Saturday night with a throbbing headache on her right upper side of her head, And noticed she had redness in her right eye. She does state that everything is blurry in her right eye. She doesn't know of any trauma to eye, however she does state she got really upset on Friday afternoon which she is curious if this caused it. No crusting in the am with her eye.   She does have sinus pressure as well. She has nasal congestion. Slight sore throat but also having allergy symptoms.   Had tick bite last Tuesday, was red but improving. Slightly engorged tick at the time.       ROS: Negative unless specifically indicated above in HPI.   Relevant past medical history reviewed and updated as indicated.   Allergies and medications reviewed and updated.   Current Outpatient Medications:    acetaminophen (TYLENOL) 325 MG tablet, Take 2 tablets (650 mg total) by mouth every 4 (four) hours as needed for mild pain (or temp > 37.5 C (99.5 F))., Disp: , Rfl:    albuterol (VENTOLIN HFA) 108 (90 Base) MCG/ACT inhaler, Inhale 2 puffs into the lungs every 6 (six) hours as needed for wheezing or shortness of breath., Disp: 1 each, Rfl: 11   Budeson-Glycopyrrol-Formoterol (BREZTRI AEROSPHERE) 160-9-4.8 MCG/ACT AERO, Inhale 2 puffs into the lungs in the morning and at bedtime., Disp: 1 each, Rfl: 11   carbidopa-levodopa  (SINEMET IR) 25-100 MG tablet, Take 2 tablets by mouth 3 (three) times daily., Disp: , Rfl:    cyanocobalamin (VITAMIN B12) 100 MCG tablet, Take 100 mcg by mouth daily., Disp: , Rfl:    doxycycline (VIBRA-TABS) 100 MG tablet, Take 1 tablet (100 mg total) by mouth 2 (two) times daily for 7 days., Disp: 14 tablet, Rfl: 0   escitalopram (LEXAPRO) 20 MG tablet, Take 1 tablet (20 mg total) by mouth daily., Disp: 90 tablet, Rfl: 3   folic acid (FOLVITE) 1 MG tablet, TAKE 1 TABLET BY MOUTH EVERY DAY, Disp: 90 tablet, Rfl: 1   hydrOXYzine (ATARAX) 25 MG tablet, Take 1 tablet (25 mg total) by mouth 2 (two) times daily as needed for anxiety or nausea., Disp: 180 tablet, Rfl: 1   ibuprofen (ADVIL) 400 MG tablet, Take 1 tablet (400 mg total) by mouth every 8 (eight) hours as needed for headache or moderate pain., Disp: 20 tablet, Rfl: 0   meloxicam (MOBIC) 15 MG tablet, Take 1 tablet (15 mg total) by mouth daily., Disp: 30 tablet, Rfl: 0   OXYGEN, Inhale 3 L/min into the lungs See admin instructions. 3 L/min at bedtime and as needed for shortness of breath throughout the day, Disp: , Rfl:    pantoprazole (PROTONIX) 20 MG tablet, Take by mouth., Disp: , Rfl:    potassium chloride SA (KLOR-CON M)  20 MEQ tablet, Take 1 tablet (20 mEq total) by mouth daily., Disp: 90 tablet, Rfl: 1   predniSONE (DELTASONE) 20 MG tablet, Take three tablets once daily, Disp: 90 tablet, Rfl: 0   promethazine (PHENERGAN) 12.5 MG tablet, Take 1 tablet (12.5 mg total) by mouth every 12 (twelve) hours as needed for nausea or vomiting., Disp: 20 tablet, Rfl: 0   topiramate (TOPAMAX) 50 MG tablet, TAKE 1 TABLET BY MOUTH EVERYDAY AT BEDTIME, Disp: 90 tablet, Rfl: 0   valsartan (DIOVAN) 80 MG tablet, Take 1 tablet (80 mg total) by mouth daily., Disp: 90 tablet, Rfl: 3  Allergies  Allergen Reactions   Meperidine Hives and Anaphylaxis   Strawberry Extract Hives and Swelling   Sucralfate Nausea Only and Other (See Comments)    Severe  nausea Other reaction(s): Other (See Comments) Severe nausea Severe nausea   Wasp Venom Hives    Objective:   BP 132/76   Pulse 72   Temp 97.6 F (36.4 C) (Temporal)   Ht 6' (1.829 m)   Wt 182 lb 9.6 oz (82.8 kg)   SpO2 98%   BMI 24.77 kg/m    Physical Exam Constitutional:      General: She is not in acute distress.    Appearance: Normal appearance. She is normal weight. She is not ill-appearing, toxic-appearing or diaphoretic.  HENT:     Head: Normocephalic.     Nose:     Right Sinus: Maxillary sinus tenderness and frontal sinus tenderness present.     Comments: Temporal tenderness on right temple  Eyes:     General: Lids are normal. Visual field deficit (right eye) present.        Right eye: No foreign body, discharge or hordeolum.        Left eye: No foreign body, discharge or hordeolum.     Comments: Redness to right eye with movement pain , slight yellowing of left side of inner eye  Cardiovascular:     Rate and Rhythm: Normal rate.  Pulmonary:     Effort: Pulmonary effort is normal.  Musculoskeletal:        General: Normal range of motion.  Neurological:     General: No focal deficit present.     Mental Status: She is alert and oriented to person, place, and time. Mental status is at baseline.  Psychiatric:        Mood and Affect: Mood normal.        Behavior: Behavior normal.        Thought Content: Thought content normal.        Judgment: Judgment normal.     Assessment & Plan:  Vision changes -     Ambulatory referral to Ophthalmology -     Sedimentation rate -     C-reactive protein -     CBC -     predniSONE; Take three tablets once daily  Dispense: 90 tablet; Refill: 0  Blurry vision Assessment & Plan: High suspicion for giant cell arteritis  Rx prednisone high dose 60 mg once daily  Rtc two weeks Trying to stat ophthalmologist evaluation, trying for today, pending referral team  Did advise pt if any declining vision needs to go to ER .     Orders: -     Ambulatory referral to Ophthalmology -     Sedimentation rate -     C-reactive protein -     CBC -     predniSONE; Take three tablets once daily  Dispense: 90 tablet; Refill: 0  Acute non-recurrent frontal sinusitis -     Sedimentation rate -     C-reactive protein -     CBC  Acute nonintractable headache, unspecified headache type -     Sedimentation rate -     C-reactive protein -     CBC -     predniSONE; Take three tablets once daily  Dispense: 90 tablet; Refill: 0  Tick bite, unspecified site, initial encounter -     B. burgdorfi antibodies  Temporal headache     Follow up plan: Return in about 2 weeks (around 06/10/2023).  Mort Sawyers, FNP

## 2023-05-27 NOTE — Assessment & Plan Note (Signed)
High suspicion for giant cell arteritis  Rx prednisone high dose 60 mg once daily  Rtc two weeks Trying to stat ophthalmologist evaluation, trying for today, pending referral team  Did advise pt if any declining vision needs to go to ER .

## 2023-05-27 NOTE — Patient Instructions (Addendum)
Start prednisone.   [facility]  [address] [place], [place]  [address] Phone: [phone] APPT TOMORROW 10 AM, BE THERE AROUND [phone] FOR CHECK IN  [facility] AND ID CARD    Regards,   Mort Sawyers FNP-C

## 2023-05-28 ENCOUNTER — Ambulatory Visit: Payer: Medicare HMO | Admitting: Dermatology

## 2023-05-28 DIAGNOSIS — J449 Chronic obstructive pulmonary disease, unspecified: Secondary | ICD-10-CM | POA: Diagnosis not present

## 2023-05-28 DIAGNOSIS — I509 Heart failure, unspecified: Secondary | ICD-10-CM | POA: Diagnosis not present

## 2023-05-28 DIAGNOSIS — H5711 Ocular pain, right eye: Secondary | ICD-10-CM | POA: Diagnosis not present

## 2023-05-28 DIAGNOSIS — H353131 Nonexudative age-related macular degeneration, bilateral, early dry stage: Secondary | ICD-10-CM | POA: Diagnosis not present

## 2023-05-28 LAB — B. BURGDORFI ANTIBODIES: B burgdorferi Ab IgG+IgM: <0.9 index

## 2023-05-28 NOTE — Progress Notes (Signed)
Anemia is stable.  How is her eye? Did ophthalmology appt go well?

## 2023-05-29 ENCOUNTER — Telehealth: Payer: Self-pay | Admitting: Family

## 2023-05-29 NOTE — Telephone Encounter (Signed)
Patient returned call regarding test results.I let her know the results of her lyme test.  She stated that the her ophthalmologist said that her eye looks the same .She goes back to see him on June 28th for a follow up appointment. She stated that he said that it looks ike she is getting shingles. He changed her antibiotic from amoxicillin to valacyclovir.

## 2023-05-30 NOTE — Telephone Encounter (Signed)
Noted  

## 2023-05-31 ENCOUNTER — Encounter: Payer: Self-pay | Admitting: Family

## 2023-06-03 NOTE — Progress Notes (Signed)
Yes agree to letter thank you

## 2023-06-04 DIAGNOSIS — R569 Unspecified convulsions: Secondary | ICD-10-CM | POA: Diagnosis not present

## 2023-06-04 DIAGNOSIS — G20C Parkinsonism, unspecified: Secondary | ICD-10-CM | POA: Diagnosis not present

## 2023-06-04 DIAGNOSIS — R200 Anesthesia of skin: Secondary | ICD-10-CM | POA: Diagnosis not present

## 2023-06-04 DIAGNOSIS — G479 Sleep disorder, unspecified: Secondary | ICD-10-CM | POA: Diagnosis not present

## 2023-06-04 DIAGNOSIS — R55 Syncope and collapse: Secondary | ICD-10-CM | POA: Diagnosis not present

## 2023-06-04 DIAGNOSIS — W57XXXD Bitten or stung by nonvenomous insect and other nonvenomous arthropods, subsequent encounter: Secondary | ICD-10-CM | POA: Diagnosis not present

## 2023-06-04 DIAGNOSIS — G8194 Hemiplegia, unspecified affecting left nondominant side: Secondary | ICD-10-CM | POA: Diagnosis not present

## 2023-06-05 ENCOUNTER — Other Ambulatory Visit: Payer: Self-pay | Admitting: Student

## 2023-06-05 DIAGNOSIS — R200 Anesthesia of skin: Secondary | ICD-10-CM

## 2023-06-06 ENCOUNTER — Telehealth: Payer: Self-pay | Admitting: Family

## 2023-06-06 NOTE — Telephone Encounter (Signed)
Morgan [name] from [facility] reached out to [facility] Dr Sherlyn Hay diagnosed her with the early stage of macular degeneration and Brett Canales  at [place] does not feel like it is temporal arteritis.

## 2023-06-07 NOTE — Telephone Encounter (Signed)
Called office to get more information they are closed after 12 on Friday will need to cal back at later time.

## 2023-06-10 ENCOUNTER — Telehealth: Payer: Self-pay | Admitting: Family

## 2023-06-10 NOTE — Telephone Encounter (Signed)
No that is ok this is helpful information. Added to chart.

## 2023-06-10 NOTE — Telephone Encounter (Signed)
Left message to return call to our office.  

## 2023-06-10 NOTE — Telephone Encounter (Signed)
Patient called the office regarding letter she received in the mail. Letter was sent after attempting to contact her regarding labs from 6/10. Patient would like a call back whenever possible. Please advise, thank you.

## 2023-06-10 NOTE — Telephone Encounter (Signed)
Reviewed chart I can now see note from [facility] in chart. Do you need me to call and get any further information?

## 2023-06-11 NOTE — Telephone Encounter (Signed)
Left message to return call to our office.  

## 2023-06-12 ENCOUNTER — Encounter: Payer: Self-pay | Admitting: *Deleted

## 2023-06-12 NOTE — Telephone Encounter (Signed)
Noted  

## 2023-06-12 NOTE — Telephone Encounter (Signed)
Called patient x 3 no call back. She was calling about a letter sent for her to call about results. Will close encounter and await call back from patient.

## 2023-06-13 ENCOUNTER — Encounter: Payer: Self-pay | Admitting: Student

## 2023-06-14 DIAGNOSIS — B0233 Zoster keratitis: Secondary | ICD-10-CM | POA: Diagnosis not present

## 2023-06-18 ENCOUNTER — Ambulatory Visit: Admission: RE | Admit: 2023-06-18 | Payer: Medicare HMO | Source: Home / Self Care

## 2023-06-18 ENCOUNTER — Encounter: Admission: RE | Payer: Self-pay | Source: Home / Self Care

## 2023-06-18 HISTORY — DX: Vascular dementia, unspecified severity, without behavioral disturbance, psychotic disturbance, mood disturbance, and anxiety: F01.50

## 2023-06-18 HISTORY — DX: Headache, unspecified: R51.9

## 2023-06-18 HISTORY — DX: Chronic kidney disease, unspecified: N18.9

## 2023-06-18 SURGERY — COLONOSCOPY WITH PROPOFOL
Anesthesia: General

## 2023-06-21 ENCOUNTER — Encounter: Payer: Self-pay | Admitting: Family Medicine

## 2023-06-21 ENCOUNTER — Observation Stay
Admission: EM | Admit: 2023-06-21 | Discharge: 2023-06-22 | Disposition: A | Discharge status: Home / Self Care | Payer: Medicare HMO | Attending: Internal Medicine | Admitting: Internal Medicine

## 2023-06-21 ENCOUNTER — Other Ambulatory Visit: Payer: Self-pay

## 2023-06-21 ENCOUNTER — Telehealth: Payer: Self-pay | Admitting: Family

## 2023-06-21 DIAGNOSIS — K219 Gastro-esophageal reflux disease without esophagitis: Secondary | ICD-10-CM | POA: Diagnosis not present

## 2023-06-21 DIAGNOSIS — Z87891 Personal history of nicotine dependence: Secondary | ICD-10-CM | POA: Diagnosis not present

## 2023-06-21 DIAGNOSIS — I5030 Unspecified diastolic (congestive) heart failure: Secondary | ICD-10-CM | POA: Diagnosis not present

## 2023-06-21 DIAGNOSIS — J449 Chronic obstructive pulmonary disease, unspecified: Secondary | ICD-10-CM | POA: Diagnosis not present

## 2023-06-21 DIAGNOSIS — N1831 Chronic kidney disease, stage 3a: Secondary | ICD-10-CM | POA: Diagnosis not present

## 2023-06-21 DIAGNOSIS — Z96651 Presence of right artificial knee joint: Secondary | ICD-10-CM | POA: Insufficient documentation

## 2023-06-21 DIAGNOSIS — I10 Essential (primary) hypertension: Secondary | ICD-10-CM | POA: Diagnosis present

## 2023-06-21 DIAGNOSIS — T783XXA Angioneurotic edema, initial encounter: Secondary | ICD-10-CM | POA: Diagnosis not present

## 2023-06-21 DIAGNOSIS — G20A1 Parkinson's disease without dyskinesia, without mention of fluctuations: Secondary | ICD-10-CM | POA: Diagnosis present

## 2023-06-21 DIAGNOSIS — I5032 Chronic diastolic (congestive) heart failure: Secondary | ICD-10-CM | POA: Diagnosis present

## 2023-06-21 DIAGNOSIS — G20C Parkinsonism, unspecified: Secondary | ICD-10-CM | POA: Diagnosis not present

## 2023-06-21 DIAGNOSIS — I130 Hypertensive heart and chronic kidney disease with heart failure and stage 1 through stage 4 chronic kidney disease, or unspecified chronic kidney disease: Secondary | ICD-10-CM | POA: Diagnosis not present

## 2023-06-21 DIAGNOSIS — N183 Chronic kidney disease, stage 3 unspecified: Secondary | ICD-10-CM | POA: Diagnosis present

## 2023-06-21 LAB — CBC WITH DIFFERENTIAL/PLATELET
Abs Immature Granulocytes: 0.05 10*3/uL (ref 0.00–0.07)
Basophils Absolute: 0 10*3/uL (ref 0.0–0.1)
Basophils Relative: 0 %
Eosinophils Absolute: 0.1 10*3/uL (ref 0.0–0.5)
Eosinophils Relative: 1 %
HCT: 38.7 % (ref 36.0–46.0)
Hemoglobin: 12.1 g/dL (ref 12.0–15.0)
Immature Granulocytes: 1 %
Lymphocytes Relative: 26 %
Lymphs Abs: 2.5 10*3/uL (ref 0.7–4.0)
MCH: 27.7 pg (ref 26.0–34.0)
MCHC: 31.3 g/dL (ref 30.0–36.0)
MCV: 88.6 fL (ref 80.0–100.0)
Monocytes Absolute: 0.3 10*3/uL (ref 0.1–1.0)
Monocytes Relative: 3 %
Neutro Abs: 6.7 10*3/uL (ref 1.7–7.7)
Neutrophils Relative %: 69 %
Platelets: 299 10*3/uL (ref 150–400)
RBC: 4.37 MIL/uL (ref 3.87–5.11)
RDW: 15.9 % — ABNORMAL HIGH (ref 11.5–15.5)
WBC: 9.7 10*3/uL (ref 4.0–10.5)
nRBC: 0 % (ref 0.0–0.2)

## 2023-06-21 LAB — COMPREHENSIVE METABOLIC PANEL
ALT: 11 U/L (ref 0–44)
AST: 15 U/L (ref 15–41)
Albumin: 3.7 g/dL (ref 3.5–5.0)
Alkaline Phosphatase: 81 U/L (ref 38–126)
Anion gap: 11 (ref 5–15)
BUN: 29 mg/dL — ABNORMAL HIGH (ref 8–23)
CO2: 21 mmol/L — ABNORMAL LOW (ref 22–32)
Calcium: 9.1 mg/dL (ref 8.9–10.3)
Chloride: 105 mmol/L (ref 98–111)
Creatinine, Ser: 1.33 mg/dL — ABNORMAL HIGH (ref 0.44–1.00)
GFR, Estimated: 43 mL/min — ABNORMAL LOW (ref >60)
Glucose, Bld: 100 mg/dL — ABNORMAL HIGH (ref 70–99)
Potassium: 3.7 mmol/L (ref 3.5–5.1)
Sodium: 137 mmol/L (ref 135–145)
Total Bilirubin: 0.5 mg/dL (ref 0.3–1.2)
Total Protein: 7.2 g/dL (ref 6.5–8.1)

## 2023-06-21 MED ORDER — ONDANSETRON HCL 4 MG/2ML IJ SOLN
4.0000 mg | Freq: Four times a day (QID) | INTRAMUSCULAR | Status: DC | PRN
  Administered 2023-06-21 – 2023-06-22 (×3): 4 mg via INTRAVENOUS
  Filled 2023-06-21 (×3): qty 2

## 2023-06-21 MED ORDER — ONDANSETRON HCL 4 MG PO TABS: 4.0000 mg | ORAL_TABLET | Freq: Four times a day (QID) | ORAL | Status: DC | PRN

## 2023-06-21 MED ORDER — METHYLPREDNISOLONE SODIUM SUCC 40 MG IJ SOLR
40.0000 mg | Freq: Once | INTRAMUSCULAR | Status: AC
  Administered 2023-06-21: 40 mg via INTRAVENOUS
  Filled 2023-06-21: qty 1

## 2023-06-21 MED ORDER — DIPHENHYDRAMINE HCL 50 MG/ML IJ SOLN
25.0000 mg | Freq: Once | INTRAMUSCULAR | Status: AC
  Administered 2023-06-21: 25 mg via INTRAVENOUS
  Filled 2023-06-21: qty 1

## 2023-06-21 MED ORDER — METHYLPREDNISOLONE SODIUM SUCC 125 MG IJ SOLR
125.0000 mg | INTRAMUSCULAR | Status: AC
  Administered 2023-06-21: 125 mg via INTRAVENOUS
  Filled 2023-06-21: qty 2

## 2023-06-21 MED ORDER — DIPHENHYDRAMINE HCL 50 MG/ML IJ SOLN
25.0000 mg | Freq: Four times a day (QID) | INTRAMUSCULAR | Status: AC
  Administered 2023-06-21 – 2023-06-22 (×3): 25 mg via INTRAVENOUS
  Filled 2023-06-21 (×3): qty 1

## 2023-06-21 MED ORDER — SODIUM CHLORIDE 0.9 % IV SOLN: INTRAVENOUS | Status: DC

## 2023-06-21 MED ORDER — ENOXAPARIN SODIUM 40 MG/0.4ML IJ SOSY
40.0000 mg | PREFILLED_SYRINGE | INTRAMUSCULAR | Status: DC
  Administered 2023-06-21: 40 mg via SUBCUTANEOUS
  Filled 2023-06-21: qty 0.4

## 2023-06-21 MED ORDER — EPINEPHRINE PF 1 MG/ML IJ SOLN
0.1500 mg | Freq: Once | INTRAMUSCULAR | Status: AC
  Administered 2023-06-21: 0.15 mg via INTRAMUSCULAR
  Filled 2023-06-21: qty 1

## 2023-06-21 MED ORDER — FAMOTIDINE IN NACL 20-0.9 MG/50ML-% IV SOLN
20.0000 mg | Freq: Two times a day (BID) | INTRAVENOUS | Status: DC
  Administered 2023-06-21 – 2023-06-22 (×3): 20 mg via INTRAVENOUS
  Filled 2023-06-21 (×3): qty 50

## 2023-06-21 NOTE — H&P (Addendum)
History and Physical    Patient: Morgan [name] ZOX:[id number] DOB: 1951-09-06 DOA: 06/21/2023 DOS: the patient was seen and examined on 06/21/2023 PCP: Mort Sawyers, FNP  Patient coming from: Home  Chief Complaint:  Chief Complaint  Patient presents with   Angioedema   HPI: Morgan [name] is a 72 y.o. female with medical history significant of COPD, CKD, GERD, Parkinson's, HFpEF presenting with angioedema.  Patient reports acute tongue swelling that woke her up from sleep this morning.  Minimal/mild shortness of breath.  Minimal to mild chest pain.  Denies any prior episodes like this in the past.  Was recently started on Cozaar within the past month for hypertension per patient.  Baseline COPD.  Overall well-controlled.  No reported recent flares.  No abdominal pain.  Has had worsening loss of voice.  Positive difficulty swallowing. Presented to the ER afebrile, hemodynamically stable.  Satting well on room air, placed on 1 to 2 L nasal cannula for respiratory support.  Labs are pending.  Patient was evaluated by Dr. Marella Chimes with ENT.  Noted lingular swelling though with posterior airway intact per report.  Started on IV Solu-Medrol, Benadryl. Review of Systems: As mentioned in the history of present illness. All other systems reviewed and are negative. Past Medical History:  Diagnosis Date   Allergy    Anemia    Anxiety    Arthritis    CHF (congestive heart failure) (HCC)    Chronic kidney disease    COPD (chronic obstructive pulmonary disease) (HCC)    Depression    GERD (gastroesophageal reflux disease)    Headache    Hyperlipidemia    Hypertension    MVP (mitral valve prolapse)    Parkinson disease    Vascular dementia (HCC)    Past Surgical History:  Procedure Laterality Date   ABDOMINAL HYSTERECTOMY     APPENDECTOMY     BALLOON DILATION N/A 04/21/2020   Procedure: BALLOON DILATION;  Surgeon: Rachael Fee, MD;  Location: [facility];  Service: Endoscopy;  Laterality: N/A;   pyloric/ GI   BALLOON DILATION N/A 07/04/2020   Procedure: BALLOON DILATION;  Surgeon: Napoleon Form, MD;  Location: [facility];  Service: Endoscopy;  Laterality: N/A;   BIOPSY  04/21/2020   Procedure: BIOPSY;  Surgeon: Rachael Fee, MD;  Location: [facility];  Service: Endoscopy;;   BIOPSY  07/04/2020   Procedure: BIOPSY;  Surgeon: Napoleon Form, MD;  Location: [facility];  Service: Endoscopy;;  EGD and COLON   BREAST SURGERY Right 1988   tumor removal   CHOLECYSTECTOMY N/A 05/18/2020   Procedure: LAPAROSCOPIC CHOLECYSTECTOMY;  Surgeon: Luretha Murphy, MD;  Location: [facility];  Service: General;  Laterality: N/A;   COLONOSCOPY WITH PROPOFOL N/A 07/04/2020   Procedure: COLONOSCOPY WITH PROPOFOL;  Surgeon: Napoleon Form, MD;  Location: [facility];  Service: Endoscopy;  Laterality: N/A;   COLONOSCOPY WITH PROPOFOL N/A 11/03/2021   Procedure: COLONOSCOPY WITH PROPOFOL;  Surgeon: Regis Bill, MD;  Location: [facility];  Service: Endoscopy;  Laterality: N/A;   ESOPHAGEAL DILATION  08/11/2020   Procedure: PYLORIC DILATION;  Surgeon: Sherrilyn Rist, MD;  Location: [facility];  Service: Gastroenterology;;   ESOPHAGOGASTRODUODENOSCOPY (EGD) WITH PROPOFOL N/A 04/21/2020   Procedure: ESOPHAGOGASTRODUODENOSCOPY (EGD) WITH PROPOFOL;  Surgeon: Rachael Fee, MD;  [facility];  Service: Endoscopy;  Laterality: N/A;   ESOPHAGOGASTRODUODENOSCOPY (EGD) WITH PROPOFOL N/A 07/04/2020   Procedure: ESOPHAGOGASTRODUODENOSCOPY (EGD) WITH PROPOFOL;  Surgeon: Napoleon Form, MD;  [facility]: [facility];  Service: Endoscopy;  Laterality: N/A;   ESOPHAGOGASTRODUODENOSCOPY (EGD) WITH PROPOFOL N/A 08/11/2020   Procedure: ESOPHAGOGASTRODUODENOSCOPY (EGD) WITH PROPOFOL;  Surgeon: Sherrilyn Rist, MD;  Location: [facility];  Service: Gastroenterology;  Laterality: N/A;   ESOPHAGOGASTRODUODENOSCOPY (EGD) WITH PROPOFOL N/A 11/03/2021   Procedure:  ESOPHAGOGASTRODUODENOSCOPY (EGD) WITH PROPOFOL;  Surgeon: Regis Bill, MD;  Location: [facility];  Service: Endoscopy;  Laterality: N/A;   ESOPHAGOGASTRODUODENOSCOPY (EGD) WITH PROPOFOL N/A 05/07/2022   Procedure: ESOPHAGOGASTRODUODENOSCOPY (EGD) WITH PROPOFOL;  Surgeon: Toney Reil, MD;  Location: [facility] ENDOSCOPY;  Service: Gastroenterology;  Laterality: N/A;   KNEE SURGERY  2005   2 times left   KNEE SURGERY Right    KYPHOPLASTY N/A 07/20/2021   Procedure: T 10KYPHOPLASTY;  Surgeon: Kennedy Bucker, MD;  Location: [facility];  Service: Orthopedics;  Laterality: N/A;   KYPHOPLASTY N/A 08/23/2021   Procedure: T9 KYPHOPLASTY;  Surgeon: Kennedy Bucker, MD;  Location: [facility];  Service: Orthopedics;  Laterality: N/A;   POLYPECTOMY  07/04/2020   Procedure: POLYPECTOMY;  Surgeon: Napoleon Form, MD;  [facility]: [facility];  Service: Endoscopy;;   TUBAL LIGATION     Social History:  reports that she quit smoking about 5 years ago. Her smoking use included cigarettes. She has a 15.00 pack-year smoking history. She has never used smokeless tobacco. She reports that she does not drink alcohol and does not use drugs.  Allergies  Allergen Reactions   Angiotensin Receptor Blockers Swelling    Angioedema   Meperidine Hives and Anaphylaxis   Strawberry Extract Hives and Swelling   Sucralfate Nausea Only and Other (See Comments)    Severe nausea Other reaction(s): Other (See Comments) Severe nausea Severe nausea   Wasp Venom Hives    Family History  Problem Relation Age of Onset   Breast cancer Mother 32   Ovarian cancer Mother 20   Brain cancer Mother    Hypertension Father    Hyperlipidemia Father    Heart disease Father    Colon cancer Father        diagnosed in his 56s   Breast cancer Maternal Grandmother 56   Diabetes Paternal Grandmother    Breast cancer Paternal Grandmother 55   Other Son        drug over dose   Colon cancer Maternal Grandfather        dx in his  42s   Liver disease Maternal Grandfather    Breast cancer Maternal Aunt 60   Breast cancer Maternal Aunt 60   Colon cancer Maternal Uncle    Stomach cancer Neg Hx    Pancreatic cancer Neg Hx    Esophageal cancer Neg Hx     Prior to Admission medications   Medication Sig Start Date End Date Taking? Authorizing Provider  acetaminophen (TYLENOL) 325 MG tablet Take 2 tablets (650 mg total) by mouth every 4 (four) hours as needed for mild pain (or temp > 37.5 C (99.5 F)). 01/30/22  Yes Marrion Coy, MD  albuterol (VENTOLIN HFA) 108 (90 Base) MCG/ACT inhaler Inhale 2 puffs into the lungs every 6 (six) hours as needed for wheezing or shortness of breath. 02/20/23  Yes Hunsucker, Lesia Sago, MD  carbidopa-levodopa (SINEMET IR) 25-100 MG tablet Take 2 tablets by mouth 3 (three) times daily. 11/21/21  Yes [provider]  cyanocobalamin (VITAMIN B12) 100 MCG tablet Take 100 mcg by mouth daily.   Yes [provider]  escitalopram (LEXAPRO) 10 MG tablet Take 10 mg  by mouth daily. 05/31/23  Yes [provider]  folic acid (FOLVITE) 1 MG tablet TAKE 1 TABLET BY MOUTH EVERY DAY 03/01/23  Yes Dugal, Tabitha, FNP  hydrOXYzine (ATARAX) 25 MG tablet Take 1 tablet (25 mg total) by mouth 2 (two) times daily as needed for anxiety or nausea. 05/20/23  Yes Dugal, Wyatt Mage, FNP  ibuprofen (ADVIL) 400 MG tablet Take 1 tablet (400 mg total) by mouth every 8 (eight) hours as needed for headache or moderate pain. 08/09/22  Yes Enedina Finner, MD  pantoprazole (PROTONIX) 20 MG tablet Take by mouth. 01/02/23 01/02/24 Yes [provider]  potassium chloride SA (KLOR-CON M) 20 MEQ tablet Take 1 tablet (20 mEq total) by mouth daily. 12/21/22  Yes Dugal, Wyatt Mage, FNP  promethazine (PHENERGAN) 12.5 MG tablet Take 1 tablet (12.5 mg total) by mouth every 12 (twelve) hours as needed for nausea or vomiting. 05/20/23  Yes Dugal, Wyatt Mage, FNP  scopolamine (TRANSDERM-SCOP) 1 MG/3DAYS Place 1 patch onto the skin every 3  (three) days.   Yes [provider]  topiramate (TOPAMAX) 50 MG tablet TAKE 1 TABLET BY MOUTH EVERYDAY AT BEDTIME 05/01/23  Yes Dugal, Tabitha, FNP  valACYclovir (VALTREX) 1000 MG tablet Take 1,000 mg by mouth 3 (three) times daily. 05/28/23  Yes [provider]  Budeson-Glycopyrrol-Formoterol (BREZTRI AEROSPHERE) 160-9-4.8 MCG/ACT AERO Inhale 2 puffs into the lungs in the morning and at bedtime. Patient not taking: Reported on 06/21/2023 02/20/23   Hunsucker, Lesia Sago, MD  escitalopram (LEXAPRO) 20 MG tablet Take 1 tablet (20 mg total) by mouth daily. Patient not taking: Reported on 06/21/2023 04/04/23   Mort Sawyers, FNP  meloxicam (MOBIC) 15 MG tablet Take 1 tablet (15 mg total) by mouth daily. Patient not taking: Reported on 06/21/2023 03/20/23   Rafoth, Baldemar Friday, MD  methylphenidate (RITALIN LA) 10 MG 24 hr capsule Take 5 mg by mouth daily. Patient not taking: Reported on 06/21/2023    [provider]  OXYGEN Inhale 3 L/min into the lungs See admin instructions. 3 L/min at bedtime and as needed for shortness of breath throughout the day    [provider]  predniSONE (DELTASONE) 20 MG tablet Take three tablets once daily Patient not taking: Reported on 06/21/2023 05/27/23   Mort Sawyers, FNP    Physical Exam: Vitals:   06/21/23 0846 06/21/23 0854 06/21/23 0900  BP: (!) 151/98  (!) 147/84  Pulse: 86  70  Resp: (!) 24  20  SpO2: 100%  100%  Weight:  77.1 kg    Physical Exam Constitutional:      Appearance: She is normal weight.  HENT:     Head: Normocephalic and atraumatic.     Nose: Nose normal.     Mouth/Throat:     Mouth: Mucous membranes are moist.     Comments: Positive tongue swelling with mild difficulty of facilitation of posterior oropharynx Eyes:     Pupils: Pupils are equal, round, and reactive to light.  Cardiovascular:     Rate and Rhythm: Normal rate and regular rhythm.  Pulmonary:     Effort: Pulmonary effort is normal.  Abdominal:      General: Bowel sounds are normal.  Musculoskeletal:        General: Normal range of motion.  Skin:    General: Skin is warm.  Neurological:     General: No focal deficit present.  Psychiatric:        Mood and Affect: Mood normal.     Data Reviewed:  There are no new results to review at this time. DG Knee Complete 4 Views Right CLINICAL DATA:  Right knee pain since falling yesterday.  EXAM: RIGHT KNEE - COMPLETE 4+ VIEW  COMPARISON:  Radiographs 03/26/2021  FINDINGS: The bones appear mildly demineralized. There is no evidence of acute fracture or dislocation. There are stable tricompartmental degenerative changes which are most advanced in the medial compartment. No evidence of knee joint effusion or foreign body.  IMPRESSION: No evidence of acute fracture or dislocation. Stable tricompartmental degenerative changes.  Electronically Signed   By: Carey Bullocks M.D.   On: 03/20/2023 11:22  Lab Results  Component Value Date   WBC 7.0 05/27/2023   HGB 11.0 (L) 05/27/2023   HCT 34.2 (L) 05/27/2023   MCV 86.5 05/27/2023   PLT 235.0 05/27/2023   Last metabolic panel Lab Results  Component Value Date   GLUCOSE 98 04/04/2023   NA 138 04/04/2023   K 4.3 04/04/2023   CL 105 04/04/2023   CO2 24 04/04/2023   BUN 19 04/04/2023   CREATININE 1.11 04/04/2023   GFR 50.62 (L) 04/04/2023   CALCIUM 8.9 04/04/2023   PHOS 4.1 01/20/2022   PROT 6.5 04/04/2023   ALBUMIN 4.0 04/04/2023   LABGLOB 3.4 09/11/2021   BILITOT 0.3 04/04/2023   ALKPHOS 90 04/04/2023   AST 11 04/04/2023   ALT 6 04/04/2023   ANIONGAP 8 09/23/2022    Assessment and Plan: * Angioedema Noted worsening acute tongue swelling in the setting of ARB use with concern for angioedema Status post formal evaluation by ENT at the bedside Posterior airway intact though with lingular swelling per report IV Solu-Medrol IV Benadryl and Pepcid Monitor airway for now Follow closely  Essential hypertension BP  stable Titrate home regimen  Parkinson disease Restart Sinemet as tongue swelling improves/resolves  CKD (chronic kidney disease) stage 3, GFR 30-59 ml/min (HCC) CMP pending  COPD (chronic obstructive pulmonary disease) (HCC) Stable from a respiratory standpoint-will monitor airway in setting of angioedema Continue respiratory regimen On IV Solu-Medrol in setting of angioedema  Chronic diastolic CHF (congestive heart failure) (HCC) 2D echo February 2023 with EF of 60-65% and grade 1 diastolic dysfunction Appears euvolemic at present Monitor   Greater than 50% was spent in counseling and coordination of care with patient Total encounter time 55 minutes or more    Advance Care Planning:   Code Status: Full Code   Consults: ENT- [place]   Family Communication: No family at the bedside   Severity of Illness: The appropriate patient status for this patient is OBSERVATION. Observation status is judged to be reasonable and necessary in order to provide the required intensity of service to ensure the patient's safety. The patient's presenting symptoms, physical exam findings, and initial radiographic and laboratory data in the context of their medical condition is felt to place them at decreased risk for further clinical deterioration. Furthermore, it is anticipated that the patient will be medically stable for discharge from the hospital within 2 midnights of admission.   Author: Floydene Flock, MD 06/21/2023 11:20 AM  For on call review [email or web address].

## 2023-06-21 NOTE — ED Triage Notes (Signed)
Pt to ED for waking up with shob and angioedema to face. States unsure if having allergic rxn, unsure what allergic to. Takes valsartan.  Swelling noted to tongue. With with labored breathing, unable to speak in complete sentences.

## 2023-06-21 NOTE — Op Note (Signed)
..  06/21/2023  4:41 PM    Morgan [name]  [id number]   Pre-Op Dx:  Angioedema [T78.3XXA]  Post-op Dx: Angioedema [T78.3XXA]  Proc: Transnasal flexible laryngoscopy  Surg: [name]  Anes:  none  EBL:  None  Comp:  None  Findings:  normal laryngological exam  Procedure: With the patient in a comfortable sitting position, at an appropriate level, the trans-nasal flexible laryngoscope was inserted into the patient's right nasal cavity.  This demonstrated a normal appearing nasal cavity, nasopharynx, pharynx, larynx and hypopharynx.  The vocal folds were mobile with no weakness or swelling.  The epiglottis was normal with no edema.  Base of tongue normal as well.  Imp:  Resolved angioedema  Plan: clear for PO.  Follow up as outpatient in 3 to 4 weeks.  Will sign off at this time.   [name] 4:41 PM 06/21/2023

## 2023-06-21 NOTE — Assessment & Plan Note (Addendum)
Noted worsening acute tongue swelling in the setting of ARB use with concern for angioedema Status post formal evaluation by ENT at the bedside Posterior airway intact though with lingular swelling per report IV Solu-Medrol IV Benadryl and Pepcid Monitor airway for now Follow closely

## 2023-06-21 NOTE — Consult Note (Signed)
Lyndell, Wenck [id number] June 17, 1951 Floydene Flock, MD  Reason for Consult: angioedema  HPI: 72 year old female presents for evaluation of acute onset of throat and mouth swelling.  Patient reports that at 4a.m. developed tongue and throat swelling that has persisted.  Went to ER and given epinephrine and IV steroids.  Patient is on ARB and concern for possible angioedema type reaction.  Found to be hypertensive.  History of parkinson's.  Patient reports dysphonia and mild shortness of breath.  Reports difficulty swallowing.    Allergies:  Allergies  Allergen Reactions   Angiotensin Receptor Blockers Swelling    Angioedema   Meperidine Hives and Anaphylaxis   Strawberry Extract Hives and Swelling   Sucralfate Nausea Only and Other (See Comments)    Severe nausea Other reaction(s): Other (See Comments) Severe nausea Severe nausea   Wasp Venom Hives    ROS: Review of systems normal other than 12 systems except per HPI.  PMH:  Past Medical History:  Diagnosis Date   Allergy    Anemia    Anxiety    Arthritis    CHF (congestive heart failure) (HCC)    Chronic kidney disease    COPD (chronic obstructive pulmonary disease) (HCC)    Depression    GERD (gastroesophageal reflux disease)    Headache    Hyperlipidemia    Hypertension    MVP (mitral valve prolapse)    Parkinson disease    Vascular dementia (HCC)     FH:  Family History  Problem Relation Age of Onset   Breast cancer Mother 20   Ovarian cancer Mother 74   Brain cancer Mother    Hypertension Father    Hyperlipidemia Father    Heart disease Father    Colon cancer Father        diagnosed in his 56s   Breast cancer Maternal Grandmother 72   Diabetes Paternal Grandmother    Breast cancer Paternal Grandmother 65   Other Son        drug over dose   Colon cancer Maternal Grandfather        dx in his 10s   Liver disease Maternal Grandfather    Breast cancer Maternal Aunt 60   Breast cancer Maternal Aunt 60    Colon cancer Maternal Uncle    Stomach cancer Neg Hx    Pancreatic cancer Neg Hx    Esophageal cancer Neg Hx     SH:  Social History   Socioeconomic History   Marital status: Single    Spouse name: Not on file   Number of children: 2   Years of education: Not on file   Highest education level: Not on file  Occupational History   Not on file  Tobacco Use   Smoking status: Former    Packs/day: 1.00    Years: 15.00    Additional pack years: 0.00    Total pack years: 15.00    Types: Cigarettes    Quit date: 09/12/2017    Years since quitting: 5.7   Smokeless tobacco: Never  Vaping Use   Vaping Use: Never used  Substance and Sexual Activity   Alcohol use: Never   Drug use: No   Sexual activity: Not Currently  Other Topics Concern   Not on file  Social History Narrative   Lives with Grandson's great grandparents    Grandson - 77 years old ([place])   Daughter - Dot Lanes - [place]   Support system - family,  aunt, Harriett Sine and Dorene Sorrow (who she lives with), brother   Transportation: roommates   Enjoy: [employer], playing with grandson   Exercise: PT exercises   Diet: good - chicken, some steak, tries to eat healthy, avoids fried foods   Retired from being a Lawyer   Social Determinants of Corporate investment banker Strain: Low Risk  (10/11/2022)   Overall Financial Resource Strain (CARDIA)    Difficulty of Paying Living Expenses: Not hard at all  Food Insecurity: No Food Insecurity (06/21/2023)   Hunger Vital Sign    Worried About Running Out of Food in the Last Year: Never true    Ran Out of Food in the Last Year: Never true  Transportation Needs: No Transportation Needs (06/21/2023)   PRAPARE - Administrator, Civil Service (Medical): No    Lack of Transportation (Non-Medical): No  Physical Activity: Inactive (10/11/2022)   Exercise Vital Sign    Days of Exercise per Week: 0 days    Minutes of Exercise per Session: 0 min  Stress: No Stress  Concern Present (10/11/2022)   [employer] of Occupational Health - Occupational Stress Questionnaire    Feeling of Stress : Not at all  Social Connections: Moderately Integrated (10/11/2022)   Social Connection and Isolation Panel [NHANES]    Frequency of Communication with Friends and Family: More than three times a week    Frequency of Social Gatherings with Friends and Family: More than three times a week    Attends Religious Services: More than 4 times per year    Active Member of Clubs or Organizations: Yes    Attends Banker Meetings: More than 4 times per year    Marital Status: Divorced  Intimate Partner Violence: Not At Risk (06/21/2023)   Humiliation, Afraid, Rape, and Kick questionnaire    Fear of Current or Ex-Partner: No    Emotionally Abused: No    Physically Abused: No    Sexually Abused: No    PSH:  Past Surgical History:  Procedure Laterality Date   ABDOMINAL HYSTERECTOMY     APPENDECTOMY     BALLOON DILATION N/A 04/21/2020   Procedure: BALLOON DILATION;  Surgeon: Rachael Fee, MD;  Location: [facility];  Service: Endoscopy;  Laterality: N/A;  pyloric/ GI   BALLOON DILATION N/A 07/04/2020   Procedure: BALLOON DILATION;  Surgeon: Napoleon Form, MD;  Location: [facility];  Service: Endoscopy;  Laterality: N/A;   BIOPSY  04/21/2020   Procedure: BIOPSY;  Surgeon: Rachael Fee, MD;  Location: [facility];  Service: Endoscopy;;   BIOPSY  07/04/2020   Procedure: BIOPSY;  Surgeon: Napoleon Form, MD;  Location: [facility];  Service: Endoscopy;;  EGD and COLON   BREAST SURGERY Right 1988   tumor removal   CHOLECYSTECTOMY N/A 05/18/2020   Procedure: LAPAROSCOPIC CHOLECYSTECTOMY;  Surgeon: Luretha Murphy, MD;  Location: [facility];  Service: General;  Laterality: N/A;   COLONOSCOPY WITH PROPOFOL N/A 07/04/2020   Procedure: COLONOSCOPY WITH PROPOFOL;  Surgeon: Napoleon Form, MD;  Location: [facility];  Service: Endoscopy;  Laterality:  N/A;   COLONOSCOPY WITH PROPOFOL N/A 11/03/2021   Procedure: COLONOSCOPY WITH PROPOFOL;  Surgeon: Regis Bill, MD;  Location: [facility];  Service: Endoscopy;  Laterality: N/A;   ESOPHAGEAL DILATION  08/11/2020   Procedure: PYLORIC DILATION;  Surgeon: Sherrilyn Rist, MD;  Location: [facility];  Service: Gastroenterology;;   ESOPHAGOGASTRODUODENOSCOPY (EGD) WITH PROPOFOL N/A 04/21/2020   Procedure: ESOPHAGOGASTRODUODENOSCOPY (  EGD) WITH PROPOFOL;  Surgeon: Rachael Fee, MD;  Location: [facility];  Service: Endoscopy;  Laterality: N/A;   ESOPHAGOGASTRODUODENOSCOPY (EGD) WITH PROPOFOL N/A 07/04/2020   Procedure: ESOPHAGOGASTRODUODENOSCOPY (EGD) WITH PROPOFOL;  Surgeon: Napoleon Form, MD;  Location: [facility];  Service: Endoscopy;  Laterality: N/A;   ESOPHAGOGASTRODUODENOSCOPY (EGD) WITH PROPOFOL N/A 08/11/2020   Procedure: ESOPHAGOGASTRODUODENOSCOPY (EGD) WITH PROPOFOL;  Surgeon: Sherrilyn Rist, MD;  Location: [facility];  Service: Gastroenterology;  Laterality: N/A;   ESOPHAGOGASTRODUODENOSCOPY (EGD) WITH PROPOFOL N/A 11/03/2021   Procedure: ESOPHAGOGASTRODUODENOSCOPY (EGD) WITH PROPOFOL;  Surgeon: Regis Bill, MD;  Location: [facility];  Service: Endoscopy;  Laterality: N/A;   ESOPHAGOGASTRODUODENOSCOPY (EGD) WITH PROPOFOL N/A 05/07/2022   Procedure: ESOPHAGOGASTRODUODENOSCOPY (EGD) WITH PROPOFOL;  Surgeon: Toney Reil, MD;  Location[facility] END[facility];  Service: Gastroenterology;  Laterality: N/A;   KNEE SURGERY  2005   2 times left   KNEE SURGERY Right    KYPHOPLASTY N/A 07/20/2021   Procedure: T 10KYPHOPLASTY;  Surgeon: Kennedy Bucker, MD;  Location: [facility];  Service: Orthopedics;  Laterality: N/A;   KYPHOPLASTY N/A 08/23/2021   Procedure: T9 KYPHOPLASTY;  Surgeon: Kennedy Bucker, MD;  Location: [facility];  Service: Orthopedics;  Laterality: N/A;   POLYPECTOMY  07/04/2020   Procedure: POLYPECTOMY;  Surgeon: Napoleon Form, MD;  Location: [facility];  Service: Endoscopy;;   TUBAL LIGATION      Physical  Exam:  GEN-  NAD, supine in bed watching TV NEURO- CN 2-12 grossly intact and symmetric. EARS- external ears clear NOSE- clear anteriorly OC/OP-  edema of anterior tongue and floor of mouth.  Posterior oropharynx easily visualized without masses or lesions.  Normal soft and hard palate. NECK- mild submental swelling and fullness CARD-  RRR RESP- unlabored.  Procedure:  Trans-nasal flexible laryngoscopy-  After verbal consent was obtained, the disposable distal chip laryngoscope was inserted into the patient's right nasal cavity.  This was done without anesthesia due to concern for airway swelling.  Visualization of the patient's nasal cavity. Nasopharynx, pharynx, larynx, and hypopharynx was made.  This demonstrated no abnormality of the mucosa and normal base of tongue, normal epiglottis, normal supraglottis and normal glottis.  No angioedema was seen in her airway other than oral cavity.  A/P: Angioedema vs allergic reaction of anterior oral tongue/floor of mouth.  Plan:  Reassuring exam of airway.  Will re-scope this afternoon most likely.  No role for consideration of intubation.  Normal vocal folds and normal airway that is widely patent.  Cleared for liquid nutrition given low concern for airway intervention.   Roney Mans Kyliee Ortego 06/21/2023 12:47 PM

## 2023-06-21 NOTE — Assessment & Plan Note (Signed)
Restart Sinemet as tongue swelling improves/resolves

## 2023-06-21 NOTE — Telephone Encounter (Signed)
FYI: This call has been transferred to Access Nurse. Once the result note has been entered staff can address the message at that time.  Patient called in with the following symptoms:  Red Word: swelling of face and tongue, trouble breathing and swallowing, took benadryl and no improvement   Please advise at Mobile [phone] (mobile)  Message is routed to Provider Pool and [facility] Triage

## 2023-06-21 NOTE — ED Provider Notes (Signed)
[facility] Provider Note    Event Date/Time   First MD Initiated Contact with Patient 06/21/23 [phone]     (approximate)   History   Angioedema   HPI  Morgan [name] is a 72 y.o. female awoke this morning with swelling of her tongue and neck.  This is never occurred before.  She does take blood pressure medication.  She does not have difficulty swallowing but does feel slightly labored in her breathing, but reports her tongue feels quite swollen   No itching or hives.  No known exposure to any sort of allergen.  No chest pain no wheezing no abdominal pain nausea or vomiting.  Physical Exam   Triage Vital Signs: ED Triage Vitals  Enc Vitals Group     BP 06/21/23 0846 (!) 151/98     Pulse Rate 06/21/23 0846 86     Resp 06/21/23 0846 (!) 24     Temp --      Temp src --      SpO2 06/21/23 0846 100 %     Weight 06/21/23 0854 170 lb (77.1 kg)     Height --      Head Circumference --      Peak Flow --      Pain Score 06/21/23 0854 0     Pain Loc --      Pain Edu? --      Excl. in GC? --     Most recent vital signs: Vitals:   06/21/23 1142 06/21/23 1520  BP: (!) 158/77 129/75  Pulse: 79 75  Resp: 20 18  Temp: 97.8 F (36.6 C) 98.1 F (36.7 C)  SpO2: 100% 99%     General: Awake, no distress.  CV:  Good peripheral perfusion.  Normal rate and tones Resp:  Normal effort.  Clear bilateral Abd:  No distention.  Other:  No hives or rashes.  Posterior oropharynx appears patent.  The lingula appears moderately edematous but there is 2 finger widths of space between it and the palate.  He also the anterior neck has slight anterior edema in the submandibular space and upper neck.  There is no stridor, she is swallowing and handling secretions well   ED Results / Procedures / Treatments   Labs (all labs ordered are listed, but only abnormal results are displayed) Labs Reviewed  CBC WITH DIFFERENTIAL/PLATELET - Abnormal; Notable for the  following components:      Result Value   RDW 15.9 (*)    All other components within normal limits  COMPREHENSIVE METABOLIC PANEL - Abnormal; Notable for the following components:   CO2 21 (*)    Glucose, Bld 100 (*)    BUN 29 (*)    Creatinine, Ser 1.33 (*)    GFR, Estimated 43 (*)    All other components within normal limits  HIV ANTIBODY (ROUTINE TESTING W REFLEX)  CBC  COMPREHENSIVE METABOLIC PANEL     EKG     RADIOLOGY     PROCEDURES:  Critical Care performed: No  Procedures   MEDICATIONS ORDERED IN ED: Medications  diphenhydrAMINE (BENADRYL) injection 25 mg (25 mg Intravenous Given 06/21/23 1719)  famotidine (PEPCID) IVPB 20 mg premix (20 mg Intravenous New Bag/Given 06/21/23 1313)  enoxaparin (LOVENOX) injection 40 mg (has no administration in time range)  0.9 %  sodium chloride infusion ( Intravenous New Bag/Given 06/21/23 1145)  ondansetron (ZOFRAN) tablet 4 mg ( Oral See Alternative 06/21/23 1302)    Or  ondansetron [facility]) injection 4 mg (4 mg Intravenous Given 06/21/23 1302)  EPINEPHrine (ADRENALIN) 0.15 mg (0.15 mg Intramuscular Given 06/21/23 0915)  diphenhydrAMINE (BENADRYL) injection 25 mg (25 mg Intravenous Given 06/21/23 0913)  methylPREDNISolone sodium succinate (SOLU-MEDROL) 125 mg/2 mL injection 125 mg (125 mg Intravenous Given 06/21/23 0911)  methylPREDNISolone sodium succinate (SOLU-MEDROL) 40 mg/mL injection 40 mg (40 mg Intravenous Given 06/21/23 1544)     IMPRESSION / MDM / ASSESSMENT AND PLAN / ED COURSE  I reviewed the triage vital signs and the nursing notes.                              Differential diagnosis includes, but is not limited to, angioedema, anaphylaxis, hereditary angioedema, deep tissue or neck space infection, thyroid etiology etc.  Based on clinical assessment and a clinical history is seems likely angioedema most likely from medication including ARB  I consulted with our ENT team, and Dr. Andee Poles provided laryngoscopy  demonstrating no acute upper airway compromise but some angioedema of the tongue.  His recommendation is for observation, and this seems quite reasonable.  No decline or change in condition after treatment with traditional medications for allergic reaction, thus even more suspicious for angioedema.  Consulted with and patient accepted to hospitalist Dr. Alvester Morin.  Patient agreeable with plan for admission  Patient's presentation is most consistent with acute presentation with potential threat to life or bodily function.   The patient is on the cardiac monitor to evaluate for evidence of arrhythmia and/or significant heart rate changes.       FINAL CLINICAL IMPRESSION(S) / ED DIAGNOSES   Final diagnoses:  Angioedema, initial encounter     Rx / DC Orders   ED Discharge Orders     None        Note:  This document was prepared using Dragon voice recognition software and may include unintentional dictation errors.   Sharyn Creamer, MD 06/21/23 [phone]

## 2023-06-21 NOTE — Assessment & Plan Note (Addendum)
2D echo February 2023 with EF of 60-65% and grade 1 diastolic dysfunction Appears euvolemic at present Monitor

## 2023-06-21 NOTE — Assessment & Plan Note (Signed)
BP stable Titrate home regimen 

## 2023-06-21 NOTE — Assessment & Plan Note (Signed)
CMP pending.

## 2023-06-21 NOTE — Assessment & Plan Note (Signed)
Stable from a respiratory standpoint-will monitor airway in setting of angioedema Continue respiratory regimen On IV Solu-Medrol in setting of angioedema

## 2023-06-21 NOTE — Telephone Encounter (Signed)
Noted. Patient currently under evaluation at [facility] ED. This is the appropriate place for her to be.

## 2023-06-21 NOTE — Telephone Encounter (Signed)
I have not gotten access nurse note and was going to call pt but per chart review tab pt is at [facility] ED. Sending note to Allayne Gitelman NP. Who is in office.

## 2023-06-22 DIAGNOSIS — T783XXA Angioneurotic edema, initial encounter: Secondary | ICD-10-CM | POA: Diagnosis not present

## 2023-06-22 LAB — COMPREHENSIVE METABOLIC PANEL
ALT: 10 U/L (ref 0–44)
AST: 12 U/L — ABNORMAL LOW (ref 15–41)
Albumin: 2.9 g/dL — ABNORMAL LOW (ref 3.5–5.0)
Alkaline Phosphatase: 68 U/L (ref 38–126)
Anion gap: 7 (ref 5–15)
BUN: 33 mg/dL — ABNORMAL HIGH (ref 8–23)
CO2: 23 mmol/L (ref 22–32)
Calcium: 8.7 mg/dL — ABNORMAL LOW (ref 8.9–10.3)
Chloride: 107 mmol/L (ref 98–111)
Creatinine, Ser: 1.35 mg/dL — ABNORMAL HIGH (ref 0.44–1.00)
GFR, Estimated: 42 mL/min — ABNORMAL LOW (ref >60)
Glucose, Bld: 112 mg/dL — ABNORMAL HIGH (ref 70–99)
Potassium: 4.1 mmol/L (ref 3.5–5.1)
Sodium: 137 mmol/L (ref 135–145)
Total Bilirubin: 0.4 mg/dL (ref 0.3–1.2)
Total Protein: 5.8 g/dL — ABNORMAL LOW (ref 6.5–8.1)

## 2023-06-22 LAB — CBC
HCT: 31.2 % — ABNORMAL LOW (ref 36.0–46.0)
Hemoglobin: 9.7 g/dL — ABNORMAL LOW (ref 12.0–15.0)
MCH: 27.4 pg (ref 26.0–34.0)
MCHC: 31.1 g/dL (ref 30.0–36.0)
MCV: 88.1 fL (ref 80.0–100.0)
Platelets: 246 10*3/uL (ref 150–400)
RBC: 3.54 MIL/uL — ABNORMAL LOW (ref 3.87–5.11)
RDW: 15.7 % — ABNORMAL HIGH (ref 11.5–15.5)
WBC: 6.3 10*3/uL (ref 4.0–10.5)
nRBC: 0 % (ref 0.0–0.2)

## 2023-06-22 LAB — HIV ANTIBODY (ROUTINE TESTING W REFLEX): HIV Screen 4th Generation wRfx: NONREACTIVE

## 2023-06-22 MED ORDER — IPRATROPIUM-ALBUTEROL 0.5-2.5 (3) MG/3ML IN SOLN: 3.0000 mL | Freq: Four times a day (QID) | RESPIRATORY_TRACT | Status: DC | PRN

## 2023-06-22 MED ORDER — CARBIDOPA-LEVODOPA ER 25-100 MG PO TBCR
2.0000 | EXTENDED_RELEASE_TABLET | Freq: Three times a day (TID) | ORAL | Status: DC
  Administered 2023-06-22: 2 via ORAL
  Filled 2023-06-22 (×2): qty 2

## 2023-06-22 MED ORDER — PANTOPRAZOLE SODIUM 20 MG PO TBEC
20.0000 mg | DELAYED_RELEASE_TABLET | Freq: Every day | ORAL | Status: DC
  Administered 2023-06-22: 20 mg via ORAL
  Filled 2023-06-22: qty 1

## 2023-06-22 MED ORDER — FOLIC ACID 1 MG PO TABS
1.0000 mg | ORAL_TABLET | Freq: Every day | ORAL | Status: DC
  Administered 2023-06-22: 1 mg via ORAL
  Filled 2023-06-22: qty 1

## 2023-06-22 MED ORDER — ESCITALOPRAM OXALATE 10 MG PO TABS
10.0000 mg | ORAL_TABLET | Freq: Every day | ORAL | Status: DC
  Administered 2023-06-22: 10 mg via ORAL
  Filled 2023-06-22: qty 1

## 2023-06-22 MED ORDER — TOPIRAMATE 25 MG PO TABS: 50.0000 mg | ORAL_TABLET | Freq: Every day | ORAL | Status: DC

## 2023-06-22 MED ORDER — VALACYCLOVIR HCL 500 MG PO TABS: 1000.0000 mg | ORAL_TABLET | Freq: Three times a day (TID) | ORAL | Status: DC

## 2023-06-22 NOTE — Progress Notes (Signed)
Morgan [name] to be discharged Home per MD order. Discussed prescriptions and follow up appointments with the patient. Prescriptions given to patient, medication list explained in detail. Patient verbalized understanding.  Allergies as of 06/22/2023       Reactions   Angiotensin Receptor Blockers Swelling   Angioedema   Meperidine Hives, Anaphylaxis   Strawberry Extract Hives, Swelling   Sucralfate Nausea Only, Other (See Comments)   Severe nausea Other reaction(s): Other (See Comments) Severe nausea Severe nausea   Wasp Venom Hives        Medication List     STOP taking these medications    meloxicam 15 MG tablet Commonly known as: Mobic   methylphenidate 10 MG 24 hr capsule Commonly known as: RITALIN LA   predniSONE 20 MG tablet Commonly known as: DELTASONE   valACYclovir 1000 MG tablet Commonly known as: VALTREX       TAKE these medications    acetaminophen 325 MG tablet Commonly known as: TYLENOL Take 2 tablets (650 mg total) by mouth every 4 (four) hours as needed for mild pain (or temp > 37.5 C (99.5 F)).   albuterol 108 (90 Base) MCG/ACT inhaler Commonly known as: Ventolin HFA Inhale 2 puffs into the lungs every 6 (six) hours as needed for wheezing or shortness of breath.   Breztri Aerosphere 160-9-4.8 MCG/ACT Aero Generic drug: Budeson-Glycopyrrol-Formoterol Inhale 2 puffs into the lungs in the morning and at bedtime.   carbidopa-levodopa 25-100 MG tablet Commonly known as: SINEMET IR Take 2 tablets by mouth 3 (three) times daily.   cyanocobalamin 100 MCG tablet Commonly known as: VITAMIN B12 Take 100 mcg by mouth daily.   escitalopram 10 MG tablet Commonly known as: LEXAPRO Take 10 mg by mouth daily. What changed: Another medication with the same name was removed. Continue taking this medication, and follow the directions you see here.   folic acid 1 MG tablet Commonly known as: FOLVITE TAKE 1 TABLET BY MOUTH EVERY DAY   hydrOXYzine 25 MG  tablet Commonly known as: ATARAX Take 1 tablet (25 mg total) by mouth 2 (two) times daily as needed for anxiety or nausea.   ibuprofen 400 MG tablet Commonly known as: ADVIL Take 1 tablet (400 mg total) by mouth every 8 (eight) hours as needed for headache or moderate pain.   OXYGEN Inhale 3 L/min into the lungs See admin instructions. 3 L/min at bedtime and as needed for shortness of breath throughout the day   pantoprazole 20 MG tablet Commonly known as: PROTONIX Take by mouth.   potassium chloride SA 20 MEQ tablet Commonly known as: KLOR-CON M Take 1 tablet (20 mEq total) by mouth daily.   promethazine 12.5 MG tablet Commonly known as: PHENERGAN Take 1 tablet (12.5 mg total) by mouth every 12 (twelve) hours as needed for nausea or vomiting.   scopolamine 1 MG/3DAYS Commonly known as: TRANSDERM-SCOP Place 1 patch onto the skin every 3 (three) days.   topiramate 50 MG tablet Commonly known as: TOPAMAX TAKE 1 TABLET BY MOUTH EVERYDAY AT BEDTIME        Vitals:   06/22/23 0443 06/22/23 0747  BP: 136/81 (!) 152/87  Pulse: 69 65  Resp: 19 14  Temp: 98.3 F (36.8 C) 98 F (36.7 C)  SpO2: 99% 100%    Skin clean, dry and intact without evidence of skin break down and or skin tears. IV catheter discontinued intact. Site without signs and symptoms of complications. Dressing and pressure applied. Patient denies pain  at this time. No complaints noted.  An After Visit Summary was printed and given to the patient. Patient escorted via wheelchair and discharged Home home via private auto.  Madie Reno, RN

## 2023-06-22 NOTE — Discharge Summary (Signed)
Physician Discharge Summary  SELEST YOX QIO:[id number] DOB: Aug 19, 1951 DOA: 06/21/2023  PCP: Mort Sawyers, FNP  Admit date: 06/21/2023 Discharge date: 06/22/2023  Admitted From: home  Disposition:  home   Recommendations for Outpatient Follow-up:  Follow up with PCP in 1-2 weeks   Home Health : no  Equipment/Devices:  Discharge Condition: stable CODE STATUS: full  Diet recommendation: Heart Healthy   Brief/Interim Summary: HPI was taken from Dr. Alvester Morin: Morgan [name] is a 72 y.o. female with medical history significant of COPD, CKD, GERD, Parkinson's, HFpEF presenting with angioedema.  Patient reports acute tongue swelling that woke her up from sleep this morning.  Minimal/mild shortness of breath.  Minimal to mild chest pain.  Denies any prior episodes like this in the past.  Was recently started on Cozaar within the past month for hypertension per patient.  Baseline COPD.  Overall well-controlled.  No reported recent flares.  No abdominal pain.  Has had worsening loss of voice.  Positive difficulty swallowing. Presented to the ER afebrile, hemodynamically stable.  Satting well on room air, placed on 1 to 2 L nasal cannula for respiratory support.  Labs are pending.  Patient was evaluated by Dr. Marella Chimes with ENT.  Noted lingular swelling though with posterior airway intact per report.  Started on IV Solu-Medrol, Benadryl.  Discharge Diagnoses:  Principal Problem:   Angioedema Active Problems:   Essential hypertension   Parkinson disease   CKD (chronic kidney disease) stage 3, GFR 30-59 ml/min (HCC)   COPD (chronic obstructive pulmonary disease) (HCC)   Chronic diastolic CHF (congestive heart failure) (HCC) Angioedema: w/ worsening of acute tongue swelling that is likely secondary to ARB use. ARB was d/c and placed on allergy list. S/p IV steroids. S/p transnasal flexible laryngoscopy w/ normal laryngological exam. Clear to have PO. Will need ENT f/u outpatient in 3-4 weeks. Resolved     Parkinson disease: restart home dose of sinemet    CKDIIIa: Cr is trending up slightly. Avoid nephrotoxic meds   COPD: w/o exacerbation. Continue on bronchodilators prn    Chronic diastolic CHF : echo in 01/2022 shows EF 60-65%, grade I diastolic dysfunction. Appears euvolemic    Discharge Instructions  Discharge Instructions     Diet - low sodium heart healthy   Complete by: As directed    Discharge instructions   Complete by: As directed    F/u w/ PCP in 1-2 weeks. F/u w/ ENT, Dr. Andee Poles, in 3-4 weeks   Increase activity slowly   Complete by: As directed       Allergies as of 06/22/2023       Reactions   Angiotensin Receptor Blockers Swelling   Angioedema   Meperidine Hives, Anaphylaxis   Strawberry Extract Hives, Swelling   Sucralfate Nausea Only, Other (See Comments)   Severe nausea Other reaction(s): Other (See Comments) Severe nausea Severe nausea   Wasp Venom Hives        Medication List     STOP taking these medications    meloxicam 15 MG tablet Commonly known as: Mobic   methylphenidate 10 MG 24 hr capsule Commonly known as: RITALIN LA   predniSONE 20 MG tablet Commonly known as: DELTASONE   valACYclovir 1000 MG tablet Commonly known as: VALTREX       TAKE these medications    acetaminophen 325 MG tablet Commonly known as: TYLENOL Take 2 tablets (650 mg total) by mouth every 4 (four) hours as needed for mild pain (or temp > 37.5 C (  99.5 F)).   albuterol 108 (90 Base) MCG/ACT inhaler Commonly known as: Ventolin HFA Inhale 2 puffs into the lungs every 6 (six) hours as needed for wheezing or shortness of breath.   Breztri Aerosphere 160-9-4.8 MCG/ACT Aero Generic drug: Budeson-Glycopyrrol-Formoterol Inhale 2 puffs into the lungs in the morning and at bedtime.   carbidopa-levodopa 25-100 MG tablet Commonly known as: SINEMET IR Take 2 tablets by mouth 3 (three) times daily.   cyanocobalamin 100 MCG tablet Commonly known as: VITAMIN  B12 Take 100 mcg by mouth daily.   escitalopram 10 MG tablet Commonly known as: LEXAPRO Take 10 mg by mouth daily. What changed: Another medication with the same name was removed. Continue taking this medication, and follow the directions you see here.   folic acid 1 MG tablet Commonly known as: FOLVITE TAKE 1 TABLET BY MOUTH EVERY DAY   hydrOXYzine 25 MG tablet Commonly known as: ATARAX Take 1 tablet (25 mg total) by mouth 2 (two) times daily as needed for anxiety or nausea.   ibuprofen 400 MG tablet Commonly known as: ADVIL Take 1 tablet (400 mg total) by mouth every 8 (eight) hours as needed for headache or moderate pain.   OXYGEN Inhale 3 L/min into the lungs See admin instructions. 3 L/min at bedtime and as needed for shortness of breath throughout the day   pantoprazole 20 MG tablet Commonly known as: PROTONIX Take by mouth.   potassium chloride SA 20 MEQ tablet Commonly known as: KLOR-CON M Take 1 tablet (20 mEq total) by mouth daily.   promethazine 12.5 MG tablet Commonly known as: PHENERGAN Take 1 tablet (12.5 mg total) by mouth every 12 (twelve) hours as needed for nausea or vomiting.   scopolamine 1 MG/3DAYS Commonly known as: TRANSDERM-SCOP Place 1 patch onto the skin every 3 (three) days.   topiramate 50 MG tablet Commonly known as: TOPAMAX TAKE 1 TABLET BY MOUTH EVERYDAY AT BEDTIME        Follow-up Information     Bud Face, MD Follow up.   Specialty: Otolaryngology Why: F/u in 3-4 weeks Contact information: [address] [place] [address] [phone]                Allergies  Allergen Reactions   Angiotensin Receptor Blockers Swelling    Angioedema   Meperidine Hives and Anaphylaxis   Strawberry Extract Hives and Swelling   Sucralfate Nausea Only and Other (See Comments)    Severe nausea Other reaction(s): Other (See Comments) Severe nausea Severe nausea   Wasp Venom Hives     Consultations:    Procedures/Studies: No results found. (Echo, Carotid, EGD, Colonoscopy, ERCP)    Subjective: Pt denies any complaints    Discharge Exam: Vitals:   06/22/23 0443 06/22/23 0747  BP: 136/81 (!) 152/87  Pulse: 69 65  Resp: 19 14  Temp: 98.3 F (36.8 C) 98 F (36.7 C)  SpO2: 99% 100%   Vitals:   06/21/23 2057 06/21/23 2132 06/22/23 0443 06/22/23 0747  BP:  132/66 136/81 (!) 152/87  Pulse:  73 69 65  Resp:  18 19 14   Temp:  98.4 F (36.9 C) 98.3 F (36.8 C) 98 F (36.7 C)  TempSrc:   Oral   SpO2:  98% 99% 100%  Weight:      Height: 6' (1.829 m)       General: Pt is alert, awake, not in acute distress Cardiovascular: S1/S2 +, no rubs, no gallops Respiratory: CTA bilaterally, no  wheezing, no rhonchi Abdominal: Soft, NT, ND, bowel sounds + Extremities: no edema, no cyanosis    The results of significant diagnostics from this hospitalization (including imaging, microbiology, ancillary and laboratory) are listed below for reference.     Microbiology: No results found for this or any previous visit (from the past 240 hour(s)).   Labs: BNP (last 3 results) Recent Labs    08/08/22 1113  BNP 77.7   Basic Metabolic Panel: Recent Labs  Lab 06/21/23 0858 06/22/23 0540  NA 137 137  K 3.7 4.1  CL 105 107  CO2 21* 23  GLUCOSE 100* 112*  BUN 29* 33*  CREATININE 1.33* 1.35*  CALCIUM 9.1 8.7*   Liver Function Tests: Recent Labs  Lab 06/21/23 0858 06/22/23 0540  AST 15 12*  ALT 11 10  ALKPHOS 81 68  BILITOT 0.5 0.4  PROT 7.2 5.8*  ALBUMIN 3.7 2.9*   No results for input(s): "LIPASE", "AMYLASE" in the last 168 hours. No results for input(s): "AMMONIA" in the last 168 hours. CBC: Recent Labs  Lab 06/21/23 0858 06/22/23 0540  WBC 9.7 6.3  NEUTROABS 6.7  --   HGB 12.1 9.7*  HCT 38.7 31.2*  MCV 88.6 88.1  PLT 299 246   Cardiac Enzymes: No results for input(s): "CKTOTAL", "CKMB", "CKMBINDEX", "TROPONINI" in the last 168  hours. BNP: Invalid input(s): "POCBNP" CBG: No results for input(s): "GLUCAP" in the last 168 hours. D-Dimer No results for input(s): "DDIMER" in the last 72 hours. Hgb A1c No results for input(s): "HGBA1C" in the last 72 hours. Lipid Profile No results for input(s): "CHOL", "HDL", "LDLCALC", "TRIG", "CHOLHDL", "LDLDIRECT" in the last 72 hours. Thyroid function studies No results for input(s): "TSH", "T4TOTAL", "T3FREE", "THYROIDAB" in the last 72 hours.  Invalid input(s): "FREET3" Anemia work up No results for input(s): "VITAMINB12", "FOLATE", "FERRITIN", "TIBC", "IRON", "RETICCTPCT" in the last 72 hours. Urinalysis    Component Value Date/Time   COLORURINE YELLOW 04/16/2023 1118   APPEARANCEUR CLEAR 04/16/2023 1118   APPEARANCEUR Cloudy 09/18/2012 2000   LABSPEC 1.010 04/16/2023 1118   LABSPEC 1.012 09/18/2012 2000   PHURINE 6.5 04/16/2023 1118   GLUCOSEU NEGATIVE 04/16/2023 1118   GLUCOSEU Negative 09/18/2012 2000   HGBUR NEGATIVE 04/16/2023 1118   BILIRUBINUR NEGATIVE 09/24/2022 0130   BILIRUBINUR negative 10/29/2021 1110   BILIRUBINUR negative 04/08/2019 1103   BILIRUBINUR Negative 09/18/2012 2000   KETONESUR NEGATIVE 04/16/2023 1118   PROTEINUR NEGATIVE 04/16/2023 1118   UROBILINOGEN 0.2 10/29/2021 1110   UROBILINOGEN 0.2 05/25/2017 1743   NITRITE NEGATIVE 09/24/2022 0130   LEUKOCYTESUR TRACE (A) 09/24/2022 0130   LEUKOCYTESUR Trace 09/18/2012 2000   Sepsis Labs Recent Labs  Lab 06/21/23 0858 06/22/23 0540  WBC 9.7 6.3   Microbiology No results found for this or any previous visit (from the past 240 hour(s)).   Time coordinating discharge: Over 30 minutes  SIGNED:   Charise Killian, MD  [facility] 06/22/2023, 12:08 PM Pager   If 7PM-7AM, please contact night-coverage [email or web address]

## 2023-06-22 NOTE — Plan of Care (Signed)
  Problem: Health Behavior/Discharge Planning: Goal: Ability to manage health-related needs will improve Outcome: Progressing   Problem: Clinical Measurements: Goal: Respiratory complications will improve Outcome: Progressing   Problem: Activity: Goal: Risk for activity intolerance will decrease Outcome: Progressing   Problem: Nutrition: Goal: Adequate nutrition will be maintained Outcome: Progressing   Problem: Coping: Goal: Level of anxiety will decrease Outcome: Progressing   Problem: Pain Managment: Goal: General experience of comfort will improve Outcome: Progressing   Problem: Safety: Goal: Ability to remain free from injury will improve Outcome: Progressing   Problem: Skin Integrity: Goal: Risk for impaired skin integrity will decrease Outcome: Progressing

## 2023-06-23 ENCOUNTER — Other Ambulatory Visit: Payer: Self-pay | Admitting: Family

## 2023-06-23 DIAGNOSIS — H539 Unspecified visual disturbance: Secondary | ICD-10-CM

## 2023-06-23 DIAGNOSIS — R519 Headache, unspecified: Secondary | ICD-10-CM

## 2023-06-23 DIAGNOSIS — H538 Other visual disturbances: Secondary | ICD-10-CM

## 2023-06-24 ENCOUNTER — Encounter: Payer: Self-pay | Admitting: Oncology

## 2023-06-24 ENCOUNTER — Encounter: Payer: Self-pay | Admitting: Student

## 2023-06-25 ENCOUNTER — Ambulatory Visit
Admission: RE | Admit: 2023-06-25 | Discharge: 2023-06-25 | Disposition: A | Discharge status: Home / Self Care | Payer: Medicare HMO | Source: Ambulatory Visit | Attending: Student | Admitting: Student

## 2023-06-25 DIAGNOSIS — R200 Anesthesia of skin: Secondary | ICD-10-CM

## 2023-06-25 DIAGNOSIS — J449 Chronic obstructive pulmonary disease, unspecified: Secondary | ICD-10-CM | POA: Diagnosis not present

## 2023-06-25 DIAGNOSIS — R29810 Facial weakness: Secondary | ICD-10-CM | POA: Diagnosis not present

## 2023-06-25 DIAGNOSIS — I509 Heart failure, unspecified: Secondary | ICD-10-CM | POA: Diagnosis not present

## 2023-06-26 ENCOUNTER — Inpatient Hospital Stay: Payer: Medicare HMO | Attending: Oncology

## 2023-06-26 DIAGNOSIS — D509 Iron deficiency anemia, unspecified: Secondary | ICD-10-CM | POA: Insufficient documentation

## 2023-06-26 DIAGNOSIS — D508 Other iron deficiency anemias: Secondary | ICD-10-CM

## 2023-06-26 DIAGNOSIS — Z79899 Other long term (current) drug therapy: Secondary | ICD-10-CM | POA: Diagnosis not present

## 2023-06-26 LAB — CBC WITH DIFFERENTIAL/PLATELET
Abs Immature Granulocytes: 0.06 10*3/uL (ref 0.00–0.07)
Basophils Absolute: 0 10*3/uL (ref 0.0–0.1)
Basophils Relative: 0 %
Eosinophils Absolute: 0.2 10*3/uL (ref 0.0–0.5)
Eosinophils Relative: 3 %
HCT: 33.9 % — ABNORMAL LOW (ref 36.0–46.0)
Hemoglobin: 10.8 g/dL — ABNORMAL LOW (ref 12.0–15.0)
Immature Granulocytes: 1 %
Lymphocytes Relative: 32 %
Lymphs Abs: 1.7 10*3/uL (ref 0.7–4.0)
MCH: 27.8 pg (ref 26.0–34.0)
MCHC: 31.9 g/dL (ref 30.0–36.0)
MCV: 87.4 fL (ref 80.0–100.0)
Monocytes Absolute: 0.2 10*3/uL (ref 0.1–1.0)
Monocytes Relative: 4 %
Neutro Abs: 3.2 10*3/uL (ref 1.7–7.7)
Neutrophils Relative %: 60 %
Platelets: 315 10*3/uL (ref 150–400)
RBC: 3.88 MIL/uL (ref 3.87–5.11)
RDW: 15.7 % — ABNORMAL HIGH (ref 11.5–15.5)
WBC: 5.4 10*3/uL (ref 4.0–10.5)
nRBC: 0 % (ref 0.0–0.2)

## 2023-06-26 LAB — RETIC PANEL
Immature Retic Fract: 6.6 % (ref 2.3–15.9)
RBC.: 3.89 MIL/uL (ref 3.87–5.11)
Retic Count, Absolute: 59.9 10*3/uL (ref 19.0–186.0)
Retic Ct Pct: 1.5 % (ref 0.4–3.1)
Reticulocyte Hemoglobin: 29.6 pg (ref >27.9)

## 2023-06-26 LAB — IRON AND TIBC
Iron: 42 ug/dL (ref 28–170)
Saturation Ratios: 16 % (ref 10.4–31.8)
TIBC: 263 ug/dL (ref 250–450)
UIBC: 221 ug/dL

## 2023-06-26 LAB — COMPREHENSIVE METABOLIC PANEL
ALT: 12 U/L (ref 0–44)
AST: 17 U/L (ref 15–41)
Albumin: 3.6 g/dL (ref 3.5–5.0)
Alkaline Phosphatase: 86 U/L (ref 38–126)
Anion gap: 11 (ref 5–15)
BUN: 24 mg/dL — ABNORMAL HIGH (ref 8–23)
CO2: 23 mmol/L (ref 22–32)
Calcium: 9 mg/dL (ref 8.9–10.3)
Chloride: 104 mmol/L (ref 98–111)
Creatinine, Ser: 1.08 mg/dL — ABNORMAL HIGH (ref 0.44–1.00)
GFR, Estimated: 55 mL/min — ABNORMAL LOW (ref >60)
Glucose, Bld: 120 mg/dL — ABNORMAL HIGH (ref 70–99)
Potassium: 3.6 mmol/L (ref 3.5–5.1)
Sodium: 138 mmol/L (ref 135–145)
Total Bilirubin: 0.3 mg/dL (ref 0.3–1.2)
Total Protein: 6.6 g/dL (ref 6.5–8.1)

## 2023-06-26 LAB — FERRITIN: Ferritin: 191 ng/mL (ref 11–307)

## 2023-06-27 DIAGNOSIS — H2513 Age-related nuclear cataract, bilateral: Secondary | ICD-10-CM | POA: Diagnosis not present

## 2023-06-27 DIAGNOSIS — B0233 Zoster keratitis: Secondary | ICD-10-CM | POA: Diagnosis not present

## 2023-06-27 DIAGNOSIS — J449 Chronic obstructive pulmonary disease, unspecified: Secondary | ICD-10-CM | POA: Diagnosis not present

## 2023-06-27 DIAGNOSIS — I509 Heart failure, unspecified: Secondary | ICD-10-CM | POA: Diagnosis not present

## 2023-06-28 ENCOUNTER — Ambulatory Visit: Payer: Medicare HMO | Admitting: Family

## 2023-06-28 ENCOUNTER — Inpatient Hospital Stay: Payer: Medicare HMO | Admitting: Family

## 2023-07-01 ENCOUNTER — Inpatient Hospital Stay: Payer: Medicare HMO | Admitting: Oncology

## 2023-07-01 ENCOUNTER — Inpatient Hospital Stay: Payer: Medicare HMO

## 2023-07-01 ENCOUNTER — Encounter: Payer: Self-pay | Admitting: Oncology

## 2023-07-01 VITALS — BP 153/78 | HR 62 | Resp 18

## 2023-07-01 VITALS — BP 139/81 | HR 67 | Temp 97.5°F | Resp 18 | Wt 182.3 lb

## 2023-07-01 DIAGNOSIS — D509 Iron deficiency anemia, unspecified: Secondary | ICD-10-CM | POA: Diagnosis not present

## 2023-07-01 DIAGNOSIS — D508 Other iron deficiency anemias: Secondary | ICD-10-CM | POA: Diagnosis not present

## 2023-07-01 DIAGNOSIS — N1831 Chronic kidney disease, stage 3a: Secondary | ICD-10-CM

## 2023-07-01 DIAGNOSIS — Z79899 Other long term (current) drug therapy: Secondary | ICD-10-CM | POA: Diagnosis not present

## 2023-07-01 IMAGING — DX DG CHEST 2V
2 series · 2 of 2 positions shown · non-contrast
Comparison: 10/05/2021

CLINICAL DATA: Cough for 3 days. Shortness of breath. COPD and
aortic aneurysm.

EXAM:
CHEST - 2 VIEW

[chest pa]
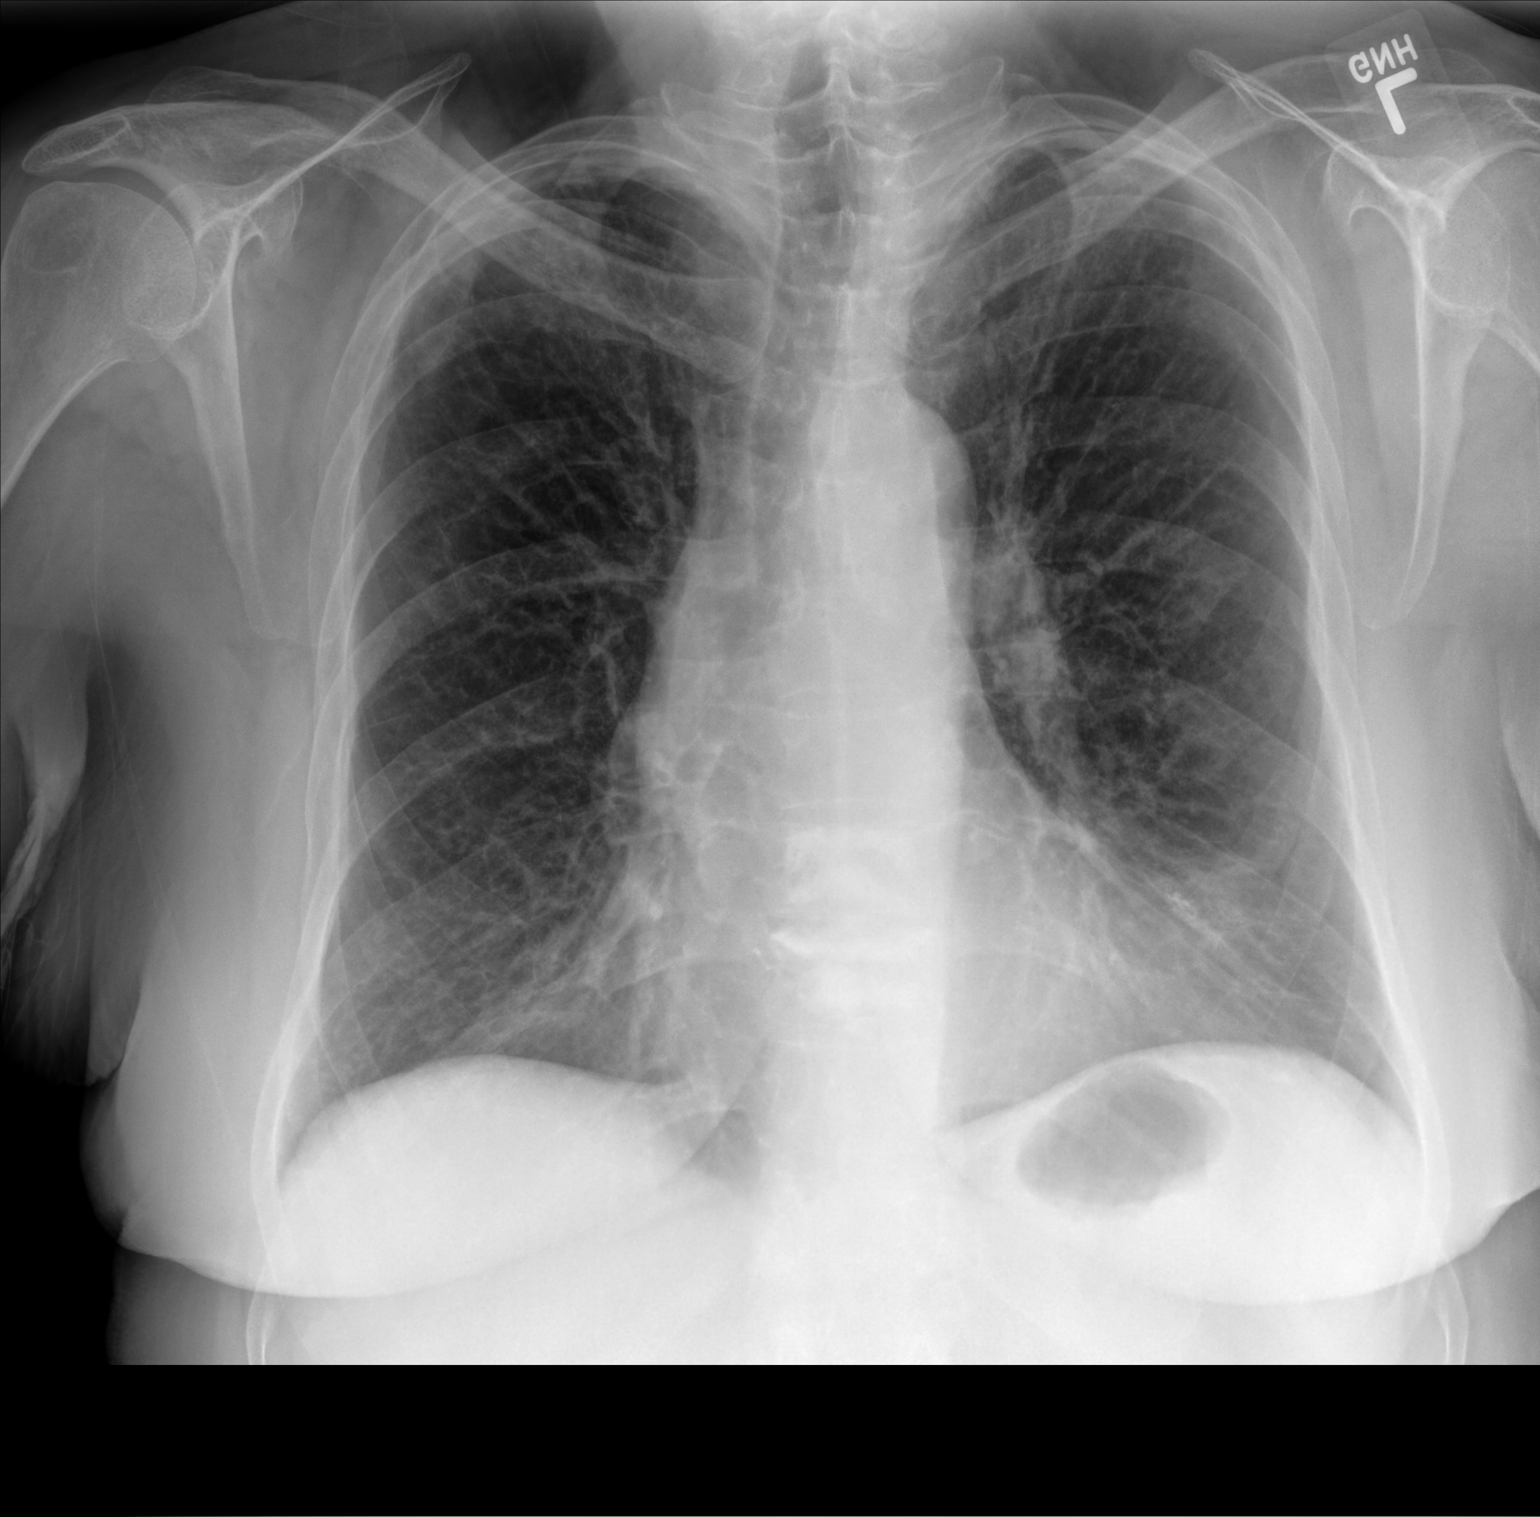

[chest lat]
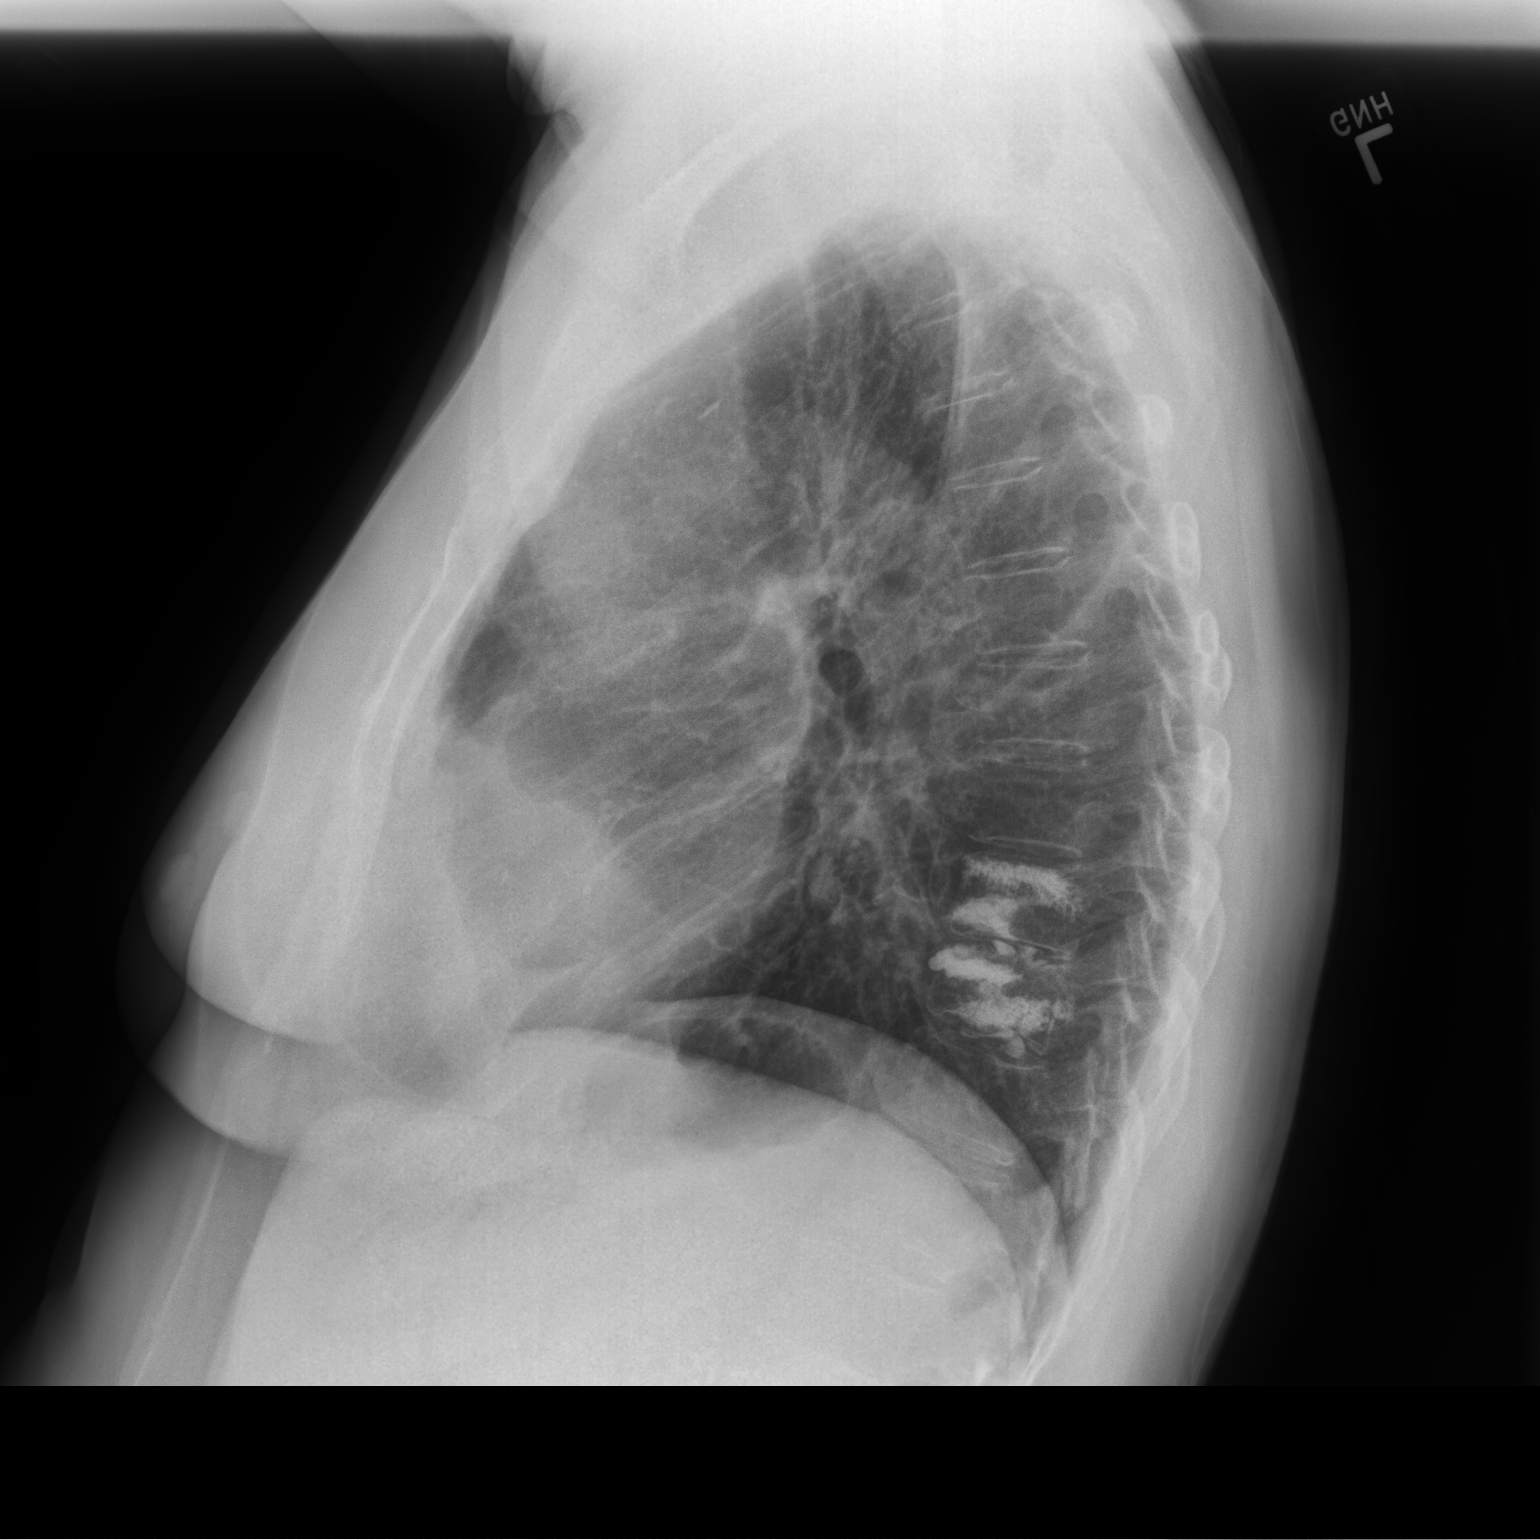

[2 of 2 positions shown; findings below may reference images not displayed]

FINDINGS: Osteopenia with vertebral augmentation at 2 lower thoracic levels.
Patient rotated minimally right. Midline trachea. Normal heart size
and mediastinal contours. No pleural effusion or pneumothorax.
Hyperinflation and interstitial thickening. Bilateral
pleuroparenchymal opacities are greater right than left. Felt to be
similar to 10/31/2020. No new pulmonary opacity.
IMPRESSION: Hyperinflation and interstitial coarsening, consistent with remote
history of smoking. No superimposed acute process or explanation for
patient's symptoms.

## 2023-07-01 MED ORDER — SODIUM CHLORIDE 0.9 % IV SOLN
INTRAVENOUS | Status: DC
  Filled 2023-07-01: qty 250

## 2023-07-01 MED ORDER — SODIUM CHLORIDE 0.9 % IV SOLN
200.0000 mg | Freq: Once | INTRAVENOUS | Status: AC
  Administered 2023-07-01: 200 mg via INTRAVENOUS
  Filled 2023-07-01: qty 200

## 2023-07-01 NOTE — Assessment & Plan Note (Addendum)
Encourage oral hydration and avoid nephrotoxins.   

## 2023-07-01 NOTE — Assessment & Plan Note (Addendum)
Iron deficiency anemia in the context of chronic kidney disease.  Labs are reviewed and discussed with patient. Lab Results  Component Value Date   HGB 10.8 (L) 06/26/2023   TIBC 263 06/26/2023   IRONPCTSAT 16 06/26/2023   FERRITIN 191 06/26/2023     I recommend Venofer 200 mg x 1 to further increase her iron store. Patient plans to reschedule her colonoscopy for further evaluation.

## 2023-07-01 NOTE — Progress Notes (Signed)
Pt here for follow up. Pt reports she was recently hospitalized due to anaphylactic reaction. She thinks it may have been her blood pressure pill.

## 2023-07-01 NOTE — Progress Notes (Signed)
Hematology/Oncology Progress note [phone] [phone]      Patient Care Team: Mort Sawyers, FNP as PCP - General (Family Medicine) Kathyrn Sheriff, [facility] (Inactive) as Pharmacist (Pharmacist) Rickard Patience, MD as Consulting Physician (Hematology and Oncology) Lonell Face, MD as Consulting Physician (Neurology) Wenda Overland, LCSW as Social Worker  CHIEF COMPLAINTS/REASON FOR VISIT:  Follow-up for iron deficiency anemia, CKD.  ASSESSMENT & PLAN:   IDA (iron deficiency anemia) Iron deficiency anemia in the context of chronic kidney disease.  Labs are reviewed and discussed with patient. Lab Results  Component Value Date   HGB 10.8 (L) 06/26/2023   TIBC 263 06/26/2023   IRONPCTSAT 16 06/26/2023   FERRITIN 191 06/26/2023     I recommend Venofer 200 mg x 1 to further increase her iron store. Patient plans to reschedule her colonoscopy for further evaluation.    CKD (chronic kidney disease) stage 3, GFR 30-59 ml/min (HCC) Encourage oral hydration and avoid nephrotoxins.     Orders Placed This Encounter  Procedures   CBC with Differential (Cancer Center Only)    Standing Status:   Future    Standing Expiration Date:   06/30/2024   CMP (Cancer Center only)    Standing Status:   Future    Standing Expiration Date:   06/30/2024   Iron and TIBC    Standing Status:   Future    Standing Expiration Date:   06/30/2024   Ferritin    Standing Status:   Future    Standing Expiration Date:   06/30/2024   Retic Panel    Standing Status:   Future    Standing Expiration Date:   06/30/2024   Follow up in 6 months All questions were answered. The patient knows to call the clinic with any problems, questions or concerns.  Rickard Patience, MD, PhD [facility] Hematology Oncology 07/01/2023   HISTORY OF PRESENTING ILLNESS:   Morgan [name] is a  72 y.o.  female with PMH listed below was seen in consultation at the request of  Mort Sawyers, FNP  for evaluation  of anemia Patient had blood work done recently on 08/29/2021. CBC showed a hemoglobin of 9.9, MCV 75.5. Patient denies any black stool or blood in the stool. She has a history of gastric stenosis for which she got EGD dilation periodically.  Last EGD was 08/11/2020. Patient reports feeling tired and fatigued. She has taken oral iron supplementation before.  Iron has made her constipation worse.  INTERVAL HISTORY Morgan [name] is a 72 y.o. female who has above history reviewed by me today presents for follow up visit for management of iron deficiency anemia and CKD.  Patient feels well at baseline.  She recently had a nonproductive reaction to one of her BP meds and was admitted.  Colonoscopy has to be rescheduled.  Denies any bleeding events.  Fatigue has improved.   Review of Systems  Constitutional:  Positive for fatigue. Negative for appetite change, chills and fever.  HENT:   Negative for hearing loss and voice change.   Eyes:  Negative for eye problems.  Respiratory:  Negative for chest tightness and cough.   Cardiovascular:  Negative for chest pain.  Gastrointestinal:  Negative for abdominal distention, abdominal pain and blood in stool.  Endocrine: Negative for hot flashes.  Genitourinary:  Negative for difficulty urinating and frequency.   Musculoskeletal:  Negative for arthralgias.  Skin:  Negative for itching and rash.  Neurological:  Negative for extremity  weakness.  Hematological:  Negative for adenopathy.  Psychiatric/Behavioral:  Negative for confusion.     MEDICAL HISTORY:  Past Medical History:  Diagnosis Date   Allergy    Anemia    Anxiety    Arthritis    CHF (congestive heart failure) (HCC)    Chronic kidney disease    COPD (chronic obstructive pulmonary disease) (HCC)    Depression    GERD (gastroesophageal reflux disease)    Headache    Hyperlipidemia    Hypertension    MVP (mitral valve prolapse)    Parkinson disease    Vascular dementia (HCC)      SURGICAL HISTORY: Past Surgical History:  Procedure Laterality Date   ABDOMINAL HYSTERECTOMY     APPENDECTOMY     BALLOON DILATION N/A 04/21/2020   Procedure: BALLOON DILATION;  Surgeon: Rachael Fee, MD;  Location: [facility];  Service: Endoscopy;  Laterality: N/A;  pyloric/ GI   BALLOON DILATION N/A 07/04/2020   Procedure: BALLOON DILATION;  Surgeon: Napoleon Form, MD;  Location: [facility];  Service: Endoscopy;  Laterality: N/A;   BIOPSY  04/21/2020   Procedure: BIOPSY;  Surgeon: Rachael Fee, MD;  Location: [facility];  Service: Endoscopy;;   BIOPSY  07/04/2020   Procedure: BIOPSY;  Surgeon: Napoleon Form, MD;  Location: [facility];  Service: Endoscopy;;  EGD and COLON   BREAST SURGERY Right 1988   tumor removal   CHOLECYSTECTOMY N/A 05/18/2020   Procedure: LAPAROSCOPIC CHOLECYSTECTOMY;  Surgeon: Luretha Murphy, MD;  Location: [facility];  Service: General;  Laterality: N/A;   COLONOSCOPY WITH PROPOFOL N/A 07/04/2020   Procedure: COLONOSCOPY WITH PROPOFOL;  Surgeon: Napoleon Form, MD;  Location: [facility];  Service: Endoscopy;  Laterality: N/A;   COLONOSCOPY WITH PROPOFOL N/A 11/03/2021   Procedure: COLONOSCOPY WITH PROPOFOL;  Surgeon: Regis Bill, MD;  Location: [facility];  Service: Endoscopy;  Laterality: N/A;   ESOPHAGEAL DILATION  08/11/2020   Procedure: PYLORIC DILATION;  Surgeon: Sherrilyn Rist, MD;  Location: [facility];  Service: Gastroenterology;;   ESOPHAGOGASTRODUODENOSCOPY (EGD) WITH PROPOFOL N/A 04/21/2020   Procedure: ESOPHAGOGASTRODUODENOSCOPY (EGD) WITH PROPOFOL;  Surgeon: Rachael Fee, MD;  [facility]: [facility];  Service: Endoscopy;  Laterality: N/A;   ESOPHAGOGASTRODUODENOSCOPY (EGD) WITH PROPOFOL N/A 07/04/2020   Procedure: ESOPHAGOGASTRODUODENOSCOPY (EGD) WITH PROPOFOL;  Surgeon: Napoleon Form, MD;  Location: [facility];  Service: Endoscopy;  Laterality: N/A;   ESOPHAGOGASTRODUODENOSCOPY (EGD) WITH PROPOFOL  N/A 08/11/2020   Procedure: ESOPHAGOGASTRODUODENOSCOPY (EGD) WITH PROPOFOL;  Surgeon: Sherrilyn Rist, MD;  Location: [facility];  Service: Gastroenterology;  Laterality: N/A;   ESOPHAGOGASTRODUODENOSCOPY (EGD) WITH PROPOFOL N/A 11/03/2021   Procedure: ESOPHAGOGASTRODUODENOSCOPY (EGD) WITH PROPOFOL;  Surgeon: Regis Bill, MD;  Location: [facility];  Service: Endoscopy;  Laterality: N/A;   ESOPHAGOGASTRODUODENOSCOPY (EGD) WITH PROPOFOL N/A 05/07/2022   Procedure: ESOPHAGOGASTRODUODENOSCOPY (EGD) WITH PROPOFOL;  Surgeon: Toney Reil, MD;  Location[facility];  Service: Gastroenterology;  Laterality: N/A;   KNEE SURGERY  2005   2 times left   KNEE SURGERY Right    KYPHOPLASTY N/A 07/20/2021   Procedure: T 10KYPHOPLASTY;  Surgeon: Kennedy Bucker, MD;  Location: [facility];  Service: Orthopedics;  Laterality: N/A;   KYPHOPLASTY N/A 08/23/2021   Procedure: T9 KYPHOPLASTY;  Surgeon: Kennedy Bucker, MD;  Location: [facility];  Service: Orthopedics;  Laterality: N/A;   POLYPECTOMY  07/04/2020   Procedure: POLYPECTOMY;  Surgeon: Napoleon Form, MD;  Location: [facility];  Service: Endoscopy;;  TUBAL LIGATION      SOCIAL HISTORY: Social History   Socioeconomic History   Marital status: Single    Spouse name: Not on file   Number of children: 2   Years of education: Not on file   Highest education level: Not on file  Occupational History   Not on file  Tobacco Use   Smoking status: Former    Current packs/day: 0.00    Average packs/day: 1 pack/day for 15.0 years (15.0 ttl pk-yrs)    Types: Cigarettes    Start date: 09/12/2002    Quit date: 09/12/2017    Years since quitting: 5.8   Smokeless tobacco: Never  Vaping Use   Vaping status: Never Used  Substance and Sexual Activity   Alcohol use: Never   Drug use: No   Sexual activity: Not Currently  Other Topics Concern   Not on file  Social History Narrative   Lives with Grandson's great grandparents    Grandson  - 73 years old ([place])   Daughter - Dot Lanes - [place]   Support system - family, aunt, Harriett Sine and Dorene Sorrow (who she lives with), brother   Transportation: roommates   Enjoy: [employer], playing with grandson   Exercise: PT exercises   Diet: good - chicken, some steak, tries to eat healthy, avoids fried foods   Retired from being a Lawyer   Social Determinants of Corporate investment banker Strain: Low Risk  (10/11/2022)   Overall Financial Resource Strain (CARDIA)    Difficulty of Paying Living Expenses: Not hard at all  Food Insecurity: No Food Insecurity (06/21/2023)   Hunger Vital Sign    Worried About Running Out of Food in the Last Year: Never true    Ran Out of Food in the Last Year: Never true  Transportation Needs: No Transportation Needs (06/21/2023)   PRAPARE - Administrator, Civil Service (Medical): No    Lack of Transportation (Non-Medical): No  Physical Activity: Inactive (10/11/2022)   Exercise Vital Sign    Days of Exercise per Week: 0 days    Minutes of Exercise per Session: 0 min  Stress: No Stress Concern Present (10/11/2022)   [employer] of Occupational Health - Occupational Stress Questionnaire    Feeling of Stress : Not at all  Social Connections: Moderately Integrated (10/11/2022)   Social Connection and Isolation Panel [NHANES]    Frequency of Communication with Friends and Family: More than three times a week    Frequency of Social Gatherings with Friends and Family: More than three times a week    Attends Religious Services: More than 4 times per year    Active Member of [employer] or Organizations: Yes    Attends Engineer, structural: More than 4 times per year    Marital Status: Divorced  Intimate Partner Violence: Not At Risk (06/21/2023)   Humiliation, Afraid, Rape, and Kick questionnaire    Fear of Current or Ex-Partner: No    Emotionally Abused: No    Physically Abused: No    Sexually Abused: No    FAMILY  HISTORY: Family History  Problem Relation Age of Onset   Breast cancer Mother 46   Ovarian cancer Mother 55   Brain cancer Mother    Hypertension Father    Hyperlipidemia Father    Heart disease Father    Colon cancer Father        diagnosed in his 43s   Breast cancer Maternal Grandmother  94   Diabetes Paternal Grandmother    Breast cancer Paternal Grandmother 32   Other Son        drug over dose   Colon cancer Maternal Grandfather        dx in his 56s   Liver disease Maternal Grandfather    Breast cancer Maternal Aunt 60   Breast cancer Maternal Aunt 60   Colon cancer Maternal Uncle    Stomach cancer Neg Hx    Pancreatic cancer Neg Hx    Esophageal cancer Neg Hx     ALLERGIES:  is allergic to angiotensin receptor blockers, meperidine, strawberry extract, sucralfate, and wasp venom.  MEDICATIONS:  Current Outpatient Medications  Medication Sig Dispense Refill   acetaminophen (TYLENOL) 325 MG tablet Take 2 tablets (650 mg total) by mouth every 4 (four) hours as needed for mild pain (or temp > 37.5 C (99.5 F)).     albuterol (VENTOLIN HFA) 108 (90 Base) MCG/ACT inhaler Inhale 2 puffs into the lungs every 6 (six) hours as needed for wheezing or shortness of breath. 1 each 11   Budeson-Glycopyrrol-Formoterol (BREZTRI AEROSPHERE) 160-9-4.8 MCG/ACT AERO Inhale 2 puffs into the lungs in the morning and at bedtime. 1 each 11   carbidopa-levodopa (SINEMET IR) 25-100 MG tablet Take 2 tablets by mouth 3 (three) times daily.     cyanocobalamin (VITAMIN B12) 100 MCG tablet Take 100 mcg by mouth daily.     escitalopram (LEXAPRO) 10 MG tablet Take 10 mg by mouth daily.     folic acid (FOLVITE) 1 MG tablet TAKE 1 TABLET BY MOUTH EVERY DAY 90 tablet 1   hydrOXYzine (ATARAX) 25 MG tablet Take 1 tablet (25 mg total) by mouth 2 (two) times daily as needed for anxiety or nausea. 180 tablet 1   ibuprofen (ADVIL) 400 MG tablet Take 1 tablet (400 mg total) by mouth every 8 (eight) hours as needed for  headache or moderate pain. 20 tablet 0   OXYGEN Inhale 3 L/min into the lungs See admin instructions. 3 L/min at bedtime and as needed for shortness of breath throughout the day     pantoprazole (PROTONIX) 20 MG tablet Take by mouth.     potassium chloride SA (KLOR-CON M) 20 MEQ tablet Take 1 tablet (20 mEq total) by mouth daily. 90 tablet 1   promethazine (PHENERGAN) 12.5 MG tablet Take 1 tablet (12.5 mg total) by mouth every 12 (twelve) hours as needed for nausea or vomiting. 20 tablet 0   topiramate (TOPAMAX) 50 MG tablet TAKE 1 TABLET BY MOUTH EVERYDAY AT BEDTIME 90 tablet 0   No current facility-administered medications for this visit.   Facility-Administered Medications Ordered in Other Visits  Medication Dose Route Frequency Provider Last Rate Last Admin   0.9 %  sodium chloride infusion   Intravenous Continuous Rickard Patience, MD   Stopped at 07/01/23 1448     PHYSICAL EXAMINATION: ECOG PERFORMANCE STATUS: 1 - Symptomatic but completely ambulatory Vitals:   07/01/23 1322  BP: 139/81  Pulse: 67  Resp: 18  Temp: (!) 97.5 F (36.4 C)   Filed Weights   07/01/23 1322  Weight: 182 lb 4.8 oz (82.7 kg)    Physical Exam Constitutional:      General: She is not in acute distress. HENT:     Head: Normocephalic and atraumatic.  Eyes:     General: No scleral icterus. Cardiovascular:     Rate and Rhythm: Normal rate and regular rhythm.     Heart sounds:  Normal heart sounds.  Pulmonary:     Effort: Pulmonary effort is normal. No respiratory distress.     Breath sounds: No wheezing.  Abdominal:     General: Bowel sounds are normal. There is no distension.     Palpations: Abdomen is soft.  Musculoskeletal:        General: No deformity. Normal range of motion.     Cervical back: Normal range of motion and neck supple.  Skin:    General: Skin is warm and dry.     Findings: No erythema or rash.  Neurological:     Mental Status: She is alert and oriented to person, place, and time.  Mental status is at baseline.     Cranial Nerves: No cranial nerve deficit.     Coordination: Coordination normal.  Psychiatric:        Mood and Affect: Mood normal.     LABORATORY DATA:  I have reviewed the data as listed     Latest Ref Rng & Units 06/26/2023   12:59 PM 06/22/2023    5:40 AM 06/21/2023    8:58 AM  CBC  WBC 4.0 - 10.5 K/uL 5.4  6.3  9.7   Hemoglobin 12.0 - 15.0 g/dL 20.2  9.7  54.2   Hematocrit 36.0 - 46.0 % 33.9  31.2  38.7   Platelets 150 - 400 K/uL 315  246  299       Latest Ref Rng & Units 06/26/2023   12:59 PM 06/22/2023    5:40 AM 06/21/2023    8:58 AM  CMP  Glucose 70 - 99 mg/dL 706  237  628   BUN 8 - 23 mg/dL 24  33  29   Creatinine 0.44 - 1.00 mg/dL 3.15  1.76  1.60   Sodium 135 - 145 mmol/L 138  137  137   Potassium 3.5 - 5.1 mmol/L 3.6  4.1  3.7   Chloride 98 - 111 mmol/L 104  107  105   CO2 22 - 32 mmol/L 23  23  21    Calcium 8.9 - 10.3 mg/dL 9.0  8.7  9.1   Total Protein 6.5 - 8.1 g/dL 6.6  5.8  7.2   Total Bilirubin 0.3 - 1.2 mg/dL 0.3  0.4  0.5   Alkaline Phos 38 - 126 U/L 86  68  81   AST 15 - 41 U/L 17  12  15    ALT 0 - 44 U/L 12  10  11      Iron/TIBC/Ferritin/ %Sat    Component Value Date/Time   IRON 42 06/26/2023 1259   TIBC 263 06/26/2023 1259   FERRITIN 191 06/26/2023 1259   IRONPCTSAT 16 06/26/2023 1259       RADIOGRAPHIC STUDIES: I have personally reviewed the radiological images as listed and agreed with the findings in the report. MR BRAIN WO CONTRAST  Result Date: 06/25/2023 CLINICAL DATA:  Provided history: Facial numbness. Additional history provided by scanning technologist: right facial numbness for several weeks. EXAM: MRI HEAD WITHOUT CONTRAST TECHNIQUE: Multiplanar, multiecho pulse sequences of the brain and surrounding structures were obtained without intravenous contrast. COMPARISON:  Brain MRI 08/09/2022. FINDINGS: Brain: No age advanced or lobar predominant parenchymal atrophy. Mild multifocal T2 FLAIR hyperintense  signal abnormality within the cerebral white matter, nonspecific but most often secondary to chronic small vessel ischemia. There is no acute infarct. No evidence of an intracranial mass. No chronic intracranial blood products. No extra-axial fluid collection. No midline shift. Vascular:  Maintained flow voids within the proximal large arterial vessels. Skull and upper cervical spine: No focal suspicious marrow lesion. Sinuses/Orbits: No mass or acute finding within the imaged orbits. Small mucous retention cyst within the left maxillary sinus. IMPRESSION: 1.  No evidence of an acute intracranial abnormality. 2. Mild multifocal T2 FLAIR hyperintense signal abnormality within the cerebral white matter, nonspecific but most often secondary to chronic small vessel ischemia. Findings are similar to the prior brain MRI of 08/09/2022. 3. Small left maxillary sinus mucous retention cyst. Electronically Signed   By: Jackey Loge D.O.   On: 06/25/2023 08:09

## 2023-07-03 ENCOUNTER — Ambulatory Visit: Payer: Medicare HMO | Admitting: Family

## 2023-07-03 ENCOUNTER — Telehealth: Payer: Self-pay | Admitting: *Deleted

## 2023-07-03 ENCOUNTER — Encounter: Payer: Self-pay | Admitting: Family

## 2023-07-03 VITALS — BP 128/70 | HR 69 | Temp 98.0°F | Ht 72.0 in | Wt 182.5 lb

## 2023-07-03 DIAGNOSIS — F4323 Adjustment disorder with mixed anxiety and depressed mood: Secondary | ICD-10-CM

## 2023-07-03 DIAGNOSIS — Z888 Allergy status to other drugs, medicaments and biological substances status: Secondary | ICD-10-CM

## 2023-07-03 DIAGNOSIS — Z636 Dependent relative needing care at home: Secondary | ICD-10-CM

## 2023-07-03 MED ORDER — SERTRALINE HCL 100 MG PO TABS: 100.0000 mg | ORAL_TABLET | Freq: Every day | ORAL | 0 refills | Status: DC

## 2023-07-03 MED ORDER — EPINEPHRINE 0.3 MG/0.3ML IJ SOAJ
0.3000 mg | INTRAMUSCULAR | 1 refills | Status: DC | PRN
Start: 2023-07-03 — End: 2024-05-28

## 2023-07-03 NOTE — Assessment & Plan Note (Signed)
D/c already Epi pen given as RX

## 2023-07-03 NOTE — Progress Notes (Signed)
  Care Coordination  Outreach Note  07/03/2023 Name: ADRIYANNA CHRISTIANS MRN: [id number] DOB: 1951/06/09   Care Coordination Outreach Attempts: An unsuccessful telephone outreach was attempted today to offer the patient information about available care coordination services.  Follow Up Plan:  Additional outreach attempts will be made to offer the patient care coordination information and services.   Encounter Outcome:  No Answer  Burman Nieves, CCMA Care Coordination Care Guide Direct Dial: [phone]

## 2023-07-03 NOTE — Progress Notes (Signed)
  Care Coordination   Note   07/03/2023 Name: AVIONA MARTENSON MRN: [id number] DOB: 1951/12/10  VIVICA DOBOSZ is a 72 y.o. year old female who sees Mort Sawyers, FNP for primary care. I reached out to Carl Best by phone today to offer care coordination services.  Ms. Rojek was given information about Care Coordination services today including:   The Care Coordination services include support from the care team which includes your Nurse Coordinator, Clinical Social Worker, or Pharmacist.  The Care Coordination team is here to help remove barriers to the health concerns and goals most important to you. Care Coordination services are voluntary, and the patient may decline or stop services at any time by request to their care team member.   Care Coordination Consent Status: Patient agreed to services and verbal consent obtained.   Follow up plan:  Telephone appointment with care coordination team member scheduled for:  07/08/2023  Encounter Outcome:  Pt. Scheduled from referral   Burman Nieves, [facility] Care Coordination Care Guide Direct Dial: [phone]

## 2023-07-03 NOTE — Assessment & Plan Note (Signed)
Worsening Stop lexapro 20 mg once daily Start sertraline 100 mg once daily Referral placed for ccm for therapy consult /resources  Hydroxyxine prn, can increase to 50 mg  May increase drowsiness

## 2023-07-03 NOTE — Patient Instructions (Signed)
  A referral was placed today for community care coordination to try to get you some information  Please let us know if you have not heard back within 2 weeks about the referral.   Regards,   Ellinor Test FNP-C

## 2023-07-03 NOTE — Progress Notes (Signed)
Established Patient Hospital follow up Visit Subjective:   Patient ID: Morgan [name], female    DOB: 02-05-1951  Age: 72 y.o. MRN: [id number]  CC:  Chief Complaint  Patient presents with   Hospitalization Follow-up        HPI  Morgan [name] is a 72 y.o. female presenting on 07/03/2023 for  hospital follow up.    Went to [facility] 7/5 , discharged 7/6 for c/o difficulty swallowing  ENT was consulted pt with laryngoscopy with no acute upper airway compromise but with some angioedema of tongue.  Admitted x one day to observe Given IV solu medrol, benadryl and pepcid inpatient  Suspected agent with anaphylaxis with ARB  Anxiety: harder and harder for her to deal with life events.  She is on lexapro 20 mg once daily and also hydroxyxine 25 mg bid prn  Has never tried anything other than lexapro      Allergies  Allergen Reactions   Angiotensin Receptor Blockers Swelling    Angioedema   Meperidine Hives and Anaphylaxis   Strawberry Extract Hives and Swelling   Sucralfate Nausea Only and Other (See Comments)    Severe nausea Other reaction(s): Other (See Comments) Severe nausea Severe nausea   Wasp Venom Hives   ----------------   Social history:  Relevant past medical, surgical, family and social history reviewed and updated as indicated. Interim medical history since our last visit reviewed.  Allergies and medications reviewed and updated.  DATA REVIEWED: CHART IN EPIC    ROS: Negative unless specifically indicated above in HPI.    Current Outpatient Medications:    acetaminophen (TYLENOL) 325 MG tablet, Take 2 tablets (650 mg total) by mouth every 4 (four) hours as needed for mild pain (or temp > 37.5 C (99.5 F))., Disp: , Rfl:    albuterol (VENTOLIN HFA) 108 (90 Base) MCG/ACT inhaler, Inhale 2 puffs into the lungs every 6 (six) hours as needed for wheezing or shortness of breath., Disp: 1 each, Rfl: 11   Budeson-Glycopyrrol-Formoterol (BREZTRI AEROSPHERE)  160-9-4.8 MCG/ACT AERO, Inhale 2 puffs into the lungs in the morning and at bedtime., Disp: 1 each, Rfl: 11   carbidopa-levodopa (SINEMET IR) 25-100 MG tablet, Take 2 tablets by mouth 3 (three) times daily., Disp: , Rfl:    cyanocobalamin (VITAMIN B12) 100 MCG tablet, Take 100 mcg by mouth daily., Disp: , Rfl:    EPINEPHrine 0.3 mg/0.3 mL IJ SOAJ injection, Inject 0.3 mg into the muscle as needed for anaphylaxis., Disp: 1 each, Rfl: 1   folic acid (FOLVITE) 1 MG tablet, TAKE 1 TABLET BY MOUTH EVERY DAY, Disp: 90 tablet, Rfl: 1   hydrOXYzine (ATARAX) 25 MG tablet, Take 1 tablet (25 mg total) by mouth 2 (two) times daily as needed for anxiety or nausea., Disp: 180 tablet, Rfl: 1   ibuprofen (ADVIL) 400 MG tablet, Take 1 tablet (400 mg total) by mouth every 8 (eight) hours as needed for headache or moderate pain., Disp: 20 tablet, Rfl: 0   OXYGEN, Inhale 3 L/min into the lungs See admin instructions. 3 L/min at bedtime and as needed for shortness of breath throughout the day, Disp: , Rfl:    pantoprazole (PROTONIX) 20 MG tablet, Take by mouth., Disp: , Rfl:    potassium chloride SA (KLOR-CON M) 20 MEQ tablet, Take 1 tablet (20 mEq total) by mouth daily., Disp: 90 tablet, Rfl: 1   promethazine (PHENERGAN) 12.5 MG tablet, Take 1 tablet (12.5 mg total) by mouth every 12 (twelve)  hours as needed for nausea or vomiting., Disp: 20 tablet, Rfl: 0   sertraline (ZOLOFT) 100 MG tablet, Take 1 tablet (100 mg total) by mouth daily., Disp: 90 tablet, Rfl: 0   topiramate (TOPAMAX) 50 MG tablet, TAKE 1 TABLET BY MOUTH EVERYDAY AT BEDTIME, Disp: 90 tablet, Rfl: 0      Objective:    BP 128/70 (BP Location: Left Arm, Patient Position: Sitting)   Pulse 69   Temp 98 F (36.7 C) (Temporal)   Ht 6' (1.829 m)   Wt 182 lb 8 oz (82.8 kg)   SpO2 96%   BMI 24.75 kg/m   Wt Readings from Last 3 Encounters:  07/03/23 182 lb 8 oz (82.8 kg)  07/01/23 182 lb 4.8 oz (82.7 kg)  06/21/23 170 lb (77.1 kg)    Physical  Exam Constitutional:      General: She is not in acute distress.    Appearance: Normal appearance. She is normal weight. She is not ill-appearing, toxic-appearing or diaphoretic.  HENT:     Head: Normocephalic.     Mouth/Throat:     Mouth: No oral lesions.     Tongue: No lesions.  Cardiovascular:     Rate and Rhythm: Normal rate and regular rhythm.  Pulmonary:     Effort: Pulmonary effort is normal.     Breath sounds: Normal breath sounds.  Musculoskeletal:        General: Normal range of motion.  Neurological:     General: No focal deficit present.     Mental Status: She is alert and oriented to person, place, and time. Mental status is at baseline.  Psychiatric:        Mood and Affect: Mood normal.        Behavior: Behavior normal.        Thought Content: Thought content normal.        Judgment: Judgment normal.         Assessment & Plan:  History of angiotensin II receptor antagonist allergy Assessment & Plan: D/c already Epi pen given as RX    Orders: -     EPINEPHrine; Inject 0.3 mg into the muscle as needed for anaphylaxis.  Dispense: 1 each; Refill: 1  Adjustment reaction with anxiety and depression Assessment & Plan: Worsening Stop lexapro 20 mg once daily Start sertraline 100 mg once daily Referral placed for ccm for therapy consult /resources  Hydroxyxine prn, can increase to 50 mg  May increase drowsiness   Orders: -     AMB Referral to [facility] ([facility] Patients) -     Sertraline HCl; Take 1 tablet (100 mg total) by mouth daily.  Dispense: 90 tablet; Refill: 0  Caregiver stress -     AMB Referral to [facility] ([facility] Patients) -     Sertraline HCl; Take 1 tablet (100 mg total) by mouth daily.  Dispense: 90 tablet; Refill: 0     Return in about 4 weeks (around 07/31/2023) for f/u anxiety.  Morgan Sawyers, FNP

## 2023-07-08 ENCOUNTER — Ambulatory Visit: Payer: Self-pay | Admitting: *Deleted

## 2023-07-08 NOTE — Patient Instructions (Addendum)
Visit Information  Thank you for taking time to visit with me today. Please don't hesitate to contact me if I can be of assistance to you.   Following are the goals we discussed today:  Please continue to consider participation in Grief counseling through Hospice and Palliative Care   Our next appointment is by telephone on 07/09/23 at 10:30am  Please call the care guide team at [phone] if you need to cancel or reschedule your appointment.   If you are experiencing a Mental Health or Behavioral Health Crisis or need someone to talk to, please call the Suicide and Crisis Lifeline: 988   Patient verbalizes understanding of instructions and care plan provided today and agrees to view in MyChart. Active MyChart status and patient understanding of how to access instructions and care plan via MyChart confirmed with patient.     Telephone follow up appointment with care management team member scheduled for: 07/09/23  Verna Czech, LCSW Clinical Social Worker  [facility] Care Management [phone]

## 2023-07-08 NOTE — Patient Outreach (Signed)
  Care Coordination   Follow Up Visit Note   07/08/2023 Name: DONNIKA KUCHER MRN: [id number] DOB: Nov 01, 1951  IRINA OKELLY is a 72 y.o. year old female who sees Mort Sawyers, FNP for primary care. I spoke with  Carl Best by phone today.  What matters to the patients health and wellness today?  Mental Health follow up, possible grief counseling    Goals Addressed             This Visit's Progress    mental health resources       Interventions Today    Flowsheet Row Most Recent Value  Chronic Disease   Chronic disease during today's visit Other  [depression]  General Interventions   General Interventions Discussed/Reviewed General Interventions Discussed, Community Resources  Mental Health Interventions   Mental Health Discussed/Reviewed Mental Health Discussed, Depression, Coping Strategies  [briefly discussed the loss of her son, grief response normalized, discussed options for grief counseling, per patient her insurance does not cover mental health , emotional support provided]              SDOH assessments and interventions completed:  Yes  SDOH Interventions Today    Flowsheet Row Most Recent Value  SDOH Interventions   Food Insecurity Interventions Intervention Not Indicated  Housing Interventions Intervention Not Indicated  Transportation Interventions Intervention Not Indicated  Utilities Interventions Intervention Not Indicated        Care Coordination Interventions:  Yes, provided   Follow up plan: Follow up call scheduled for 07/09/23    Encounter Outcome:  Pt. Visit Completed

## 2023-07-09 ENCOUNTER — Telehealth: Payer: Self-pay | Admitting: *Deleted

## 2023-07-09 ENCOUNTER — Encounter: Payer: Self-pay | Admitting: *Deleted

## 2023-07-09 ENCOUNTER — Ambulatory Visit: Payer: Medicare HMO | Admitting: Dermatology

## 2023-07-09 NOTE — Patient Outreach (Signed)
  Care Coordination   07/09/2023 Name: Morgan [name] MRN: [id number] DOB: 1951-08-08   Care Coordination Outreach Attempts:  An unsuccessful telephone outreach was attempted for a scheduled appointment today.  Follow Up Plan:  Additional outreach attempts will be made to offer the patient care coordination information and services.   Encounter Outcome:  No Answer   Care Coordination Interventions:  No, not indicated    Serina Nichter, LCSW Clinical Social Worker  [facility] Care Management [phone]

## 2023-07-11 ENCOUNTER — Telehealth: Payer: Self-pay | Admitting: Family

## 2023-07-11 NOTE — Telephone Encounter (Signed)
Called and notified patient she should have one refill left at the pharmacy to pickup. She will contact the pharmacy.

## 2023-07-11 NOTE — Telephone Encounter (Signed)
Prescription Request  07/11/2023  LOV: 07/03/2023  What is the name of the medication or equipment? hydrOXYzine (ATARAX) 25 MG tablet, should be increased to 50 MG  Have you contacted your pharmacy to request a refill? No   Which pharmacy would you like this sent to?  [id number], [place] - [address] [place] [address] Phone: [phone] Fax: [phone]     Patient notified that their request is being sent to the clinical staff for review and that they should receive a response within 2 business days.   Please advise at Mobile [phone] (mobile)

## 2023-07-16 ENCOUNTER — Telehealth: Payer: Self-pay | Admitting: Family

## 2023-07-16 DIAGNOSIS — F331 Major depressive disorder, recurrent, moderate: Secondary | ICD-10-CM

## 2023-07-16 DIAGNOSIS — R112 Nausea with vomiting, unspecified: Secondary | ICD-10-CM

## 2023-07-16 DIAGNOSIS — F411 Generalized anxiety disorder: Secondary | ICD-10-CM

## 2023-07-16 MED ORDER — HYDROXYZINE PAMOATE 50 MG PO CAPS
50.0000 mg | ORAL_CAPSULE | Freq: Three times a day (TID) | ORAL | 0 refills | Status: DC | PRN
Start: 2023-07-16 — End: 2023-09-25

## 2023-07-16 NOTE — Telephone Encounter (Signed)
[facility]

## 2023-07-16 NOTE — Telephone Encounter (Signed)
Patient contacted the office regarding medication hydrOXYzine (ATARAX) 25 MG tablet. States she is needing a refill, says that [place] has increased her dosage to 50 mg since her last visit. Patient says pharmacy told her they are not able to fill it for insurance purposes, and if Tabitha were to send in new rx for 50 mg dosage then they would be able to fill it. [facility] [place]. Please advise patient if needed at [phone]

## 2023-07-16 NOTE — Telephone Encounter (Signed)
Left message to return call to our office.  

## 2023-07-17 NOTE — Telephone Encounter (Signed)
Pt called returning Morgan [name]'s call. Told pt Dugal's response. Pt has no questions/concerns. Call back # [phone]

## 2023-07-26 DIAGNOSIS — I509 Heart failure, unspecified: Secondary | ICD-10-CM | POA: Diagnosis not present

## 2023-07-26 DIAGNOSIS — J449 Chronic obstructive pulmonary disease, unspecified: Secondary | ICD-10-CM | POA: Diagnosis not present

## 2023-07-28 DIAGNOSIS — I509 Heart failure, unspecified: Secondary | ICD-10-CM | POA: Diagnosis not present

## 2023-07-28 DIAGNOSIS — J449 Chronic obstructive pulmonary disease, unspecified: Secondary | ICD-10-CM | POA: Diagnosis not present

## 2023-07-29 ENCOUNTER — Other Ambulatory Visit: Payer: Self-pay | Admitting: Family

## 2023-07-29 DIAGNOSIS — R519 Headache, unspecified: Secondary | ICD-10-CM

## 2023-07-29 DIAGNOSIS — H538 Other visual disturbances: Secondary | ICD-10-CM

## 2023-07-29 DIAGNOSIS — H539 Unspecified visual disturbance: Secondary | ICD-10-CM

## 2023-07-30 IMAGING — DX DG CHEST 2V
2 series · 2 of 2 positions shown · non-contrast
Comparison: Chest two views 03/14/2022 and 10/05/2021; CT chest
02/20/2021

CLINICAL DATA: Cough and fever.

EXAM:
CHEST - 2 VIEW

[chest pa]
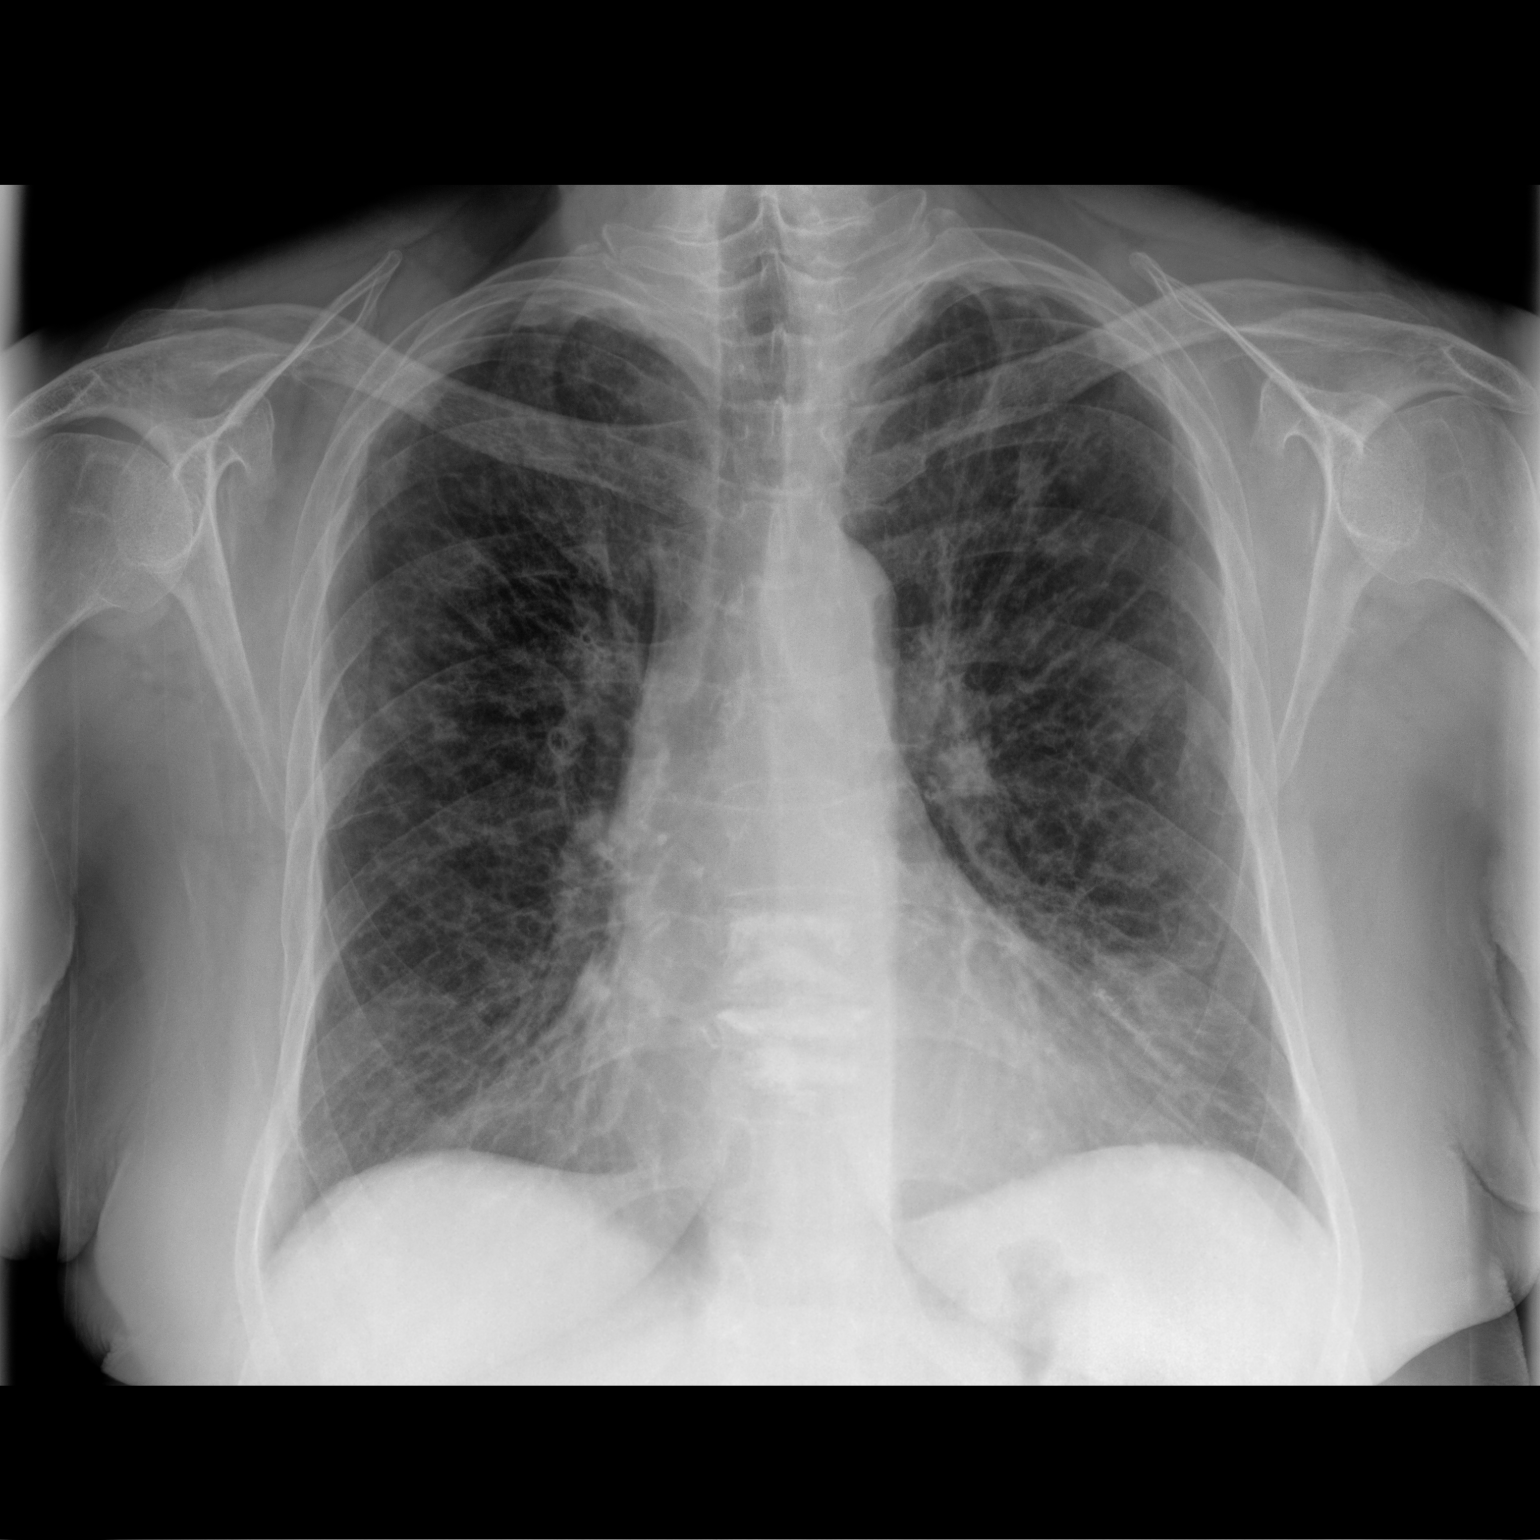

[chest lat]
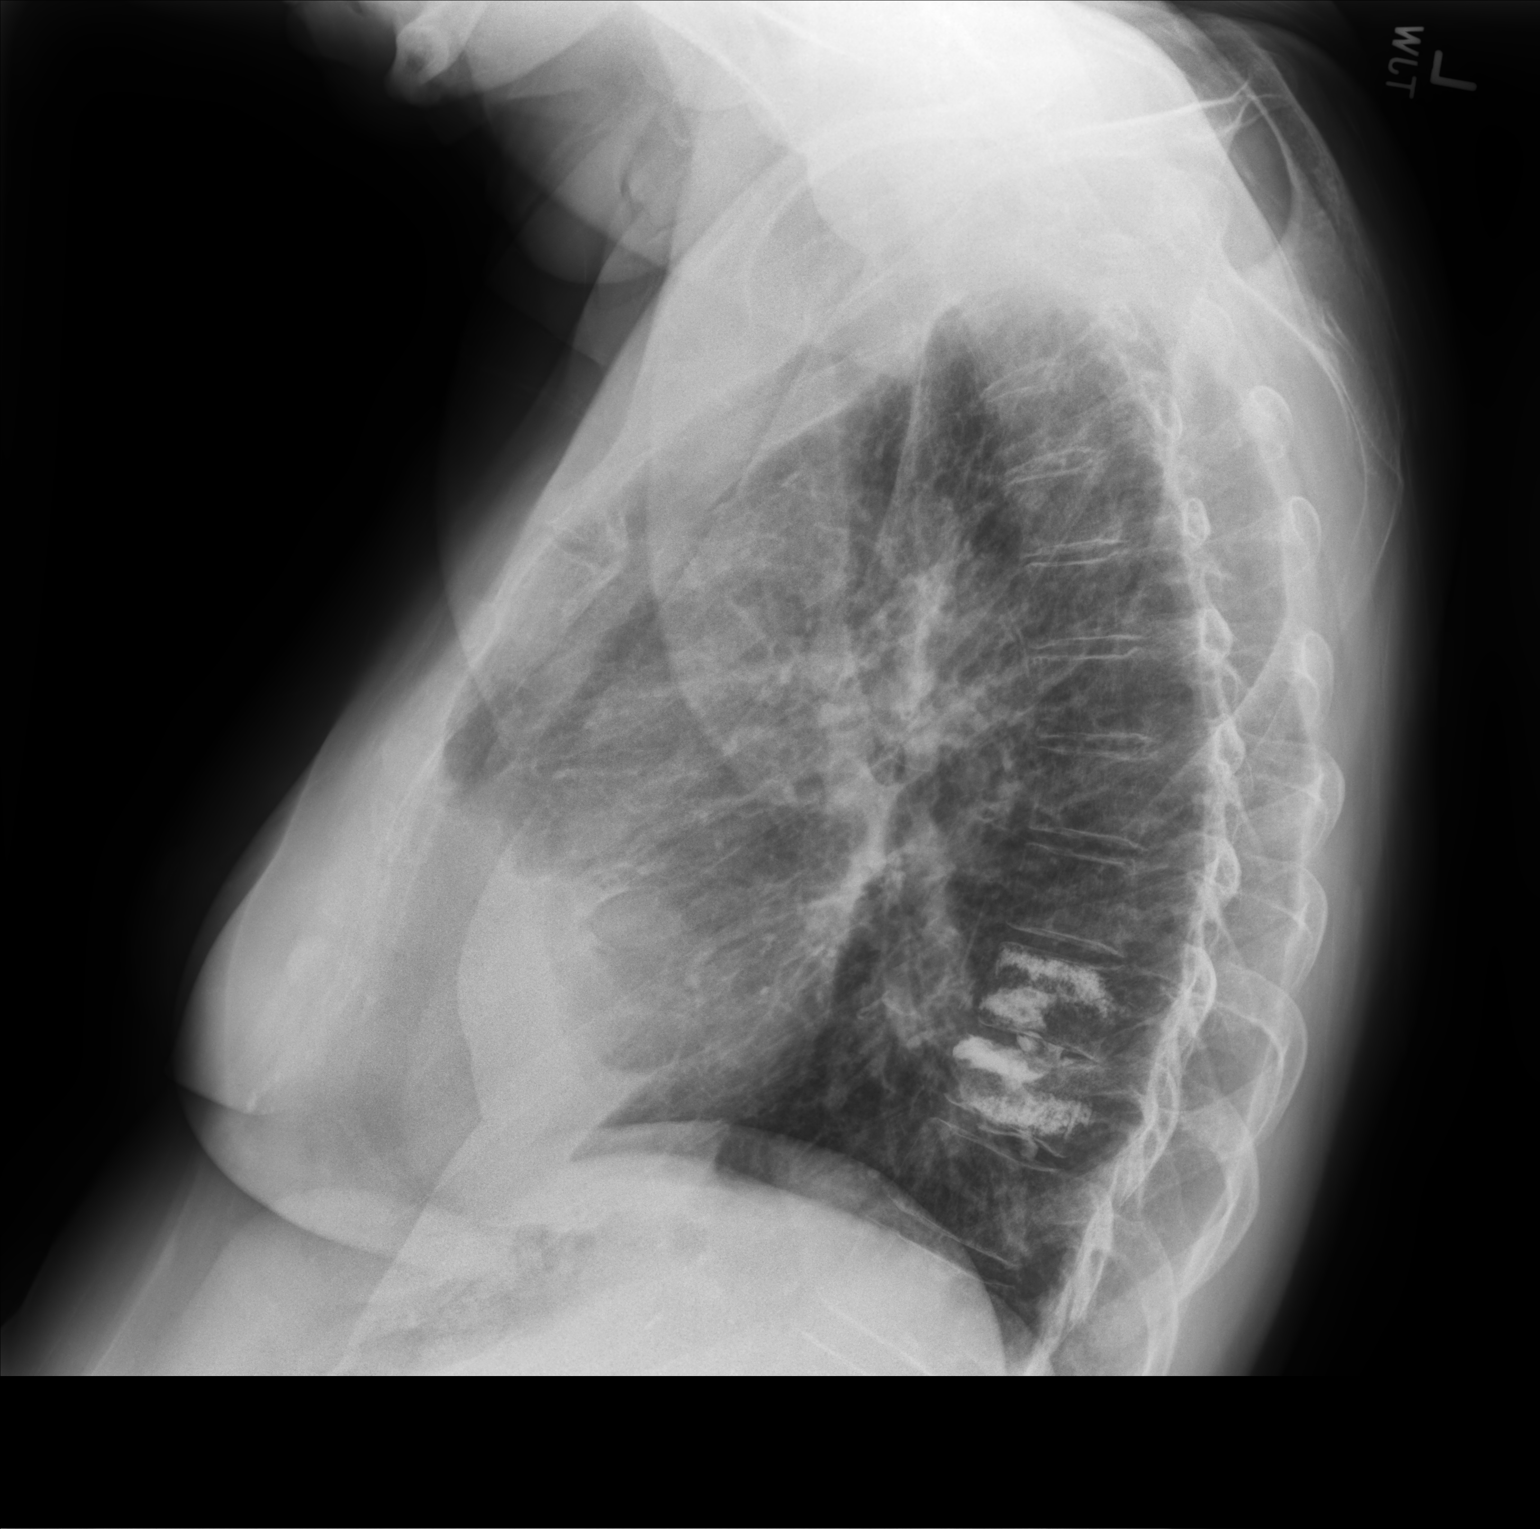

[2 of 2 positions shown; findings below may reference images not displayed]

FINDINGS: Cardiac silhouette and mediastinal contours are within normal
limits. There is flattening of the diaphragms and moderate
hyperinflation, unchanged. Chronic bilateral interstitial thickening
is unchanged from multiple prior radiographs and CT seen both within
the upper lungs and lower lungs. No new focal airspace opacity to
indicate pneumonia. No pulmonary edema pleural effusion or
pneumothorax. Cement augmentation of the T9 and T10 vertebral bodies
is again seen.
IMPRESSION: No active cardiopulmonary disease. Chronic hyperinflation and
chronic interstitial thickening, unchanged.

## 2023-08-07 ENCOUNTER — Ambulatory Visit: Payer: Medicare HMO | Attending: Pulmonary Disease

## 2023-08-12 ENCOUNTER — Emergency Department: Payer: Medicare HMO

## 2023-08-12 ENCOUNTER — Other Ambulatory Visit: Payer: Self-pay

## 2023-08-12 ENCOUNTER — Encounter: Payer: Self-pay | Admitting: Emergency Medicine

## 2023-08-12 ENCOUNTER — Emergency Department
Admission: EM | Admit: 2023-08-12 | Discharge: 2023-08-12 | Disposition: A | Discharge status: Home / Self Care | Payer: Medicare HMO | Attending: Emergency Medicine | Admitting: Emergency Medicine

## 2023-08-12 ENCOUNTER — Telehealth: Payer: Self-pay | Admitting: Family

## 2023-08-12 DIAGNOSIS — W19XXXA Unspecified fall, initial encounter: Secondary | ICD-10-CM

## 2023-08-12 DIAGNOSIS — M25561 Pain in right knee: Secondary | ICD-10-CM | POA: Insufficient documentation

## 2023-08-12 DIAGNOSIS — G20C Parkinsonism, unspecified: Secondary | ICD-10-CM | POA: Diagnosis not present

## 2023-08-12 DIAGNOSIS — J449 Chronic obstructive pulmonary disease, unspecified: Secondary | ICD-10-CM | POA: Insufficient documentation

## 2023-08-12 DIAGNOSIS — M1711 Unilateral primary osteoarthritis, right knee: Secondary | ICD-10-CM | POA: Diagnosis not present

## 2023-08-12 DIAGNOSIS — M25552 Pain in left hip: Secondary | ICD-10-CM | POA: Insufficient documentation

## 2023-08-12 DIAGNOSIS — N189 Chronic kidney disease, unspecified: Secondary | ICD-10-CM | POA: Insufficient documentation

## 2023-08-12 DIAGNOSIS — I130 Hypertensive heart and chronic kidney disease with heart failure and stage 1 through stage 4 chronic kidney disease, or unspecified chronic kidney disease: Secondary | ICD-10-CM | POA: Insufficient documentation

## 2023-08-12 DIAGNOSIS — F0390 Unspecified dementia without behavioral disturbance: Secondary | ICD-10-CM | POA: Insufficient documentation

## 2023-08-12 DIAGNOSIS — S0993XA Unspecified injury of face, initial encounter: Secondary | ICD-10-CM | POA: Diagnosis not present

## 2023-08-12 DIAGNOSIS — I509 Heart failure, unspecified: Secondary | ICD-10-CM | POA: Insufficient documentation

## 2023-08-12 DIAGNOSIS — M4802 Spinal stenosis, cervical region: Secondary | ICD-10-CM | POA: Diagnosis not present

## 2023-08-12 DIAGNOSIS — M8580 Other specified disorders of bone density and structure, unspecified site: Secondary | ICD-10-CM | POA: Diagnosis not present

## 2023-08-12 DIAGNOSIS — Z043 Encounter for examination and observation following other accident: Secondary | ICD-10-CM | POA: Diagnosis not present

## 2023-08-12 DIAGNOSIS — R8290 Unspecified abnormal findings in urine: Secondary | ICD-10-CM | POA: Insufficient documentation

## 2023-08-12 DIAGNOSIS — S2232XA Fracture of one rib, left side, initial encounter for closed fracture: Secondary | ICD-10-CM | POA: Insufficient documentation

## 2023-08-12 DIAGNOSIS — M47812 Spondylosis without myelopathy or radiculopathy, cervical region: Secondary | ICD-10-CM | POA: Diagnosis not present

## 2023-08-12 DIAGNOSIS — R519 Headache, unspecified: Secondary | ICD-10-CM | POA: Diagnosis not present

## 2023-08-12 DIAGNOSIS — M47816 Spondylosis without myelopathy or radiculopathy, lumbar region: Secondary | ICD-10-CM | POA: Diagnosis not present

## 2023-08-12 DIAGNOSIS — M25562 Pain in left knee: Secondary | ICD-10-CM | POA: Diagnosis not present

## 2023-08-12 DIAGNOSIS — W010XXA Fall on same level from slipping, tripping and stumbling without subsequent striking against object, initial encounter: Secondary | ICD-10-CM | POA: Insufficient documentation

## 2023-08-12 DIAGNOSIS — S61412A Laceration without foreign body of left hand, initial encounter: Secondary | ICD-10-CM | POA: Insufficient documentation

## 2023-08-12 DIAGNOSIS — J341 Cyst and mucocele of nose and nasal sinus: Secondary | ICD-10-CM | POA: Diagnosis not present

## 2023-08-12 DIAGNOSIS — S199XXA Unspecified injury of neck, initial encounter: Secondary | ICD-10-CM | POA: Diagnosis not present

## 2023-08-12 DIAGNOSIS — B9620 Unspecified Escherichia coli [E. coli] as the cause of diseases classified elsewhere: Secondary | ICD-10-CM | POA: Insufficient documentation

## 2023-08-12 DIAGNOSIS — R0781 Pleurodynia: Secondary | ICD-10-CM | POA: Diagnosis not present

## 2023-08-12 DIAGNOSIS — M85861 Other specified disorders of bone density and structure, right lower leg: Secondary | ICD-10-CM | POA: Diagnosis not present

## 2023-08-12 DIAGNOSIS — S20302A Unspecified superficial injuries of left front wall of thorax, initial encounter: Secondary | ICD-10-CM | POA: Diagnosis not present

## 2023-08-12 DIAGNOSIS — I6782 Cerebral ischemia: Secondary | ICD-10-CM | POA: Diagnosis not present

## 2023-08-12 DIAGNOSIS — M1712 Unilateral primary osteoarthritis, left knee: Secondary | ICD-10-CM | POA: Diagnosis not present

## 2023-08-12 DIAGNOSIS — M4312 Spondylolisthesis, cervical region: Secondary | ICD-10-CM | POA: Diagnosis not present

## 2023-08-12 LAB — URINALYSIS, ROUTINE W REFLEX MICROSCOPIC
Bilirubin Urine: NEGATIVE
Glucose, UA: NEGATIVE mg/dL
Ketones, ur: NEGATIVE mg/dL
Nitrite: NEGATIVE
Protein, ur: NEGATIVE mg/dL
Specific Gravity, Urine: 1.016 (ref 1.005–1.030)
pH: 5 (ref 5.0–8.0)

## 2023-08-12 LAB — BASIC METABOLIC PANEL
Anion gap: 8 (ref 5–15)
BUN: 27 mg/dL — ABNORMAL HIGH (ref 8–23)
CO2: 25 mmol/L (ref 22–32)
Calcium: 8.8 mg/dL — ABNORMAL LOW (ref 8.9–10.3)
Chloride: 105 mmol/L (ref 98–111)
Creatinine, Ser: 1.1 mg/dL — ABNORMAL HIGH (ref 0.44–1.00)
GFR, Estimated: 54 mL/min — ABNORMAL LOW (ref >60)
Glucose, Bld: 97 mg/dL (ref 70–99)
Potassium: 4.1 mmol/L (ref 3.5–5.1)
Sodium: 138 mmol/L (ref 135–145)

## 2023-08-12 LAB — CBC WITH DIFFERENTIAL/PLATELET
Abs Immature Granulocytes: 0.01 10*3/uL (ref 0.00–0.07)
Basophils Absolute: 0 10*3/uL (ref 0.0–0.1)
Basophils Relative: 0 %
Eosinophils Absolute: 0.2 10*3/uL (ref 0.0–0.5)
Eosinophils Relative: 3 %
HCT: 32.8 % — ABNORMAL LOW (ref 36.0–46.0)
Hemoglobin: 10.5 g/dL — ABNORMAL LOW (ref 12.0–15.0)
Immature Granulocytes: 0 %
Lymphocytes Relative: 35 %
Lymphs Abs: 2.2 10*3/uL (ref 0.7–4.0)
MCH: 28.1 pg (ref 26.0–34.0)
MCHC: 32 g/dL (ref 30.0–36.0)
MCV: 87.7 fL (ref 80.0–100.0)
Monocytes Absolute: 0.3 10*3/uL (ref 0.1–1.0)
Monocytes Relative: 5 %
Neutro Abs: 3.6 10*3/uL (ref 1.7–7.7)
Neutrophils Relative %: 57 %
Platelets: 212 10*3/uL (ref 150–400)
RBC: 3.74 MIL/uL — ABNORMAL LOW (ref 3.87–5.11)
RDW: 14.1 % (ref 11.5–15.5)
WBC: 6.3 10*3/uL (ref 4.0–10.5)
nRBC: 0 % (ref 0.0–0.2)

## 2023-08-12 MED ORDER — HYDROCODONE-ACETAMINOPHEN 5-325 MG PO TABS: 1.0000 | ORAL_TABLET | Freq: Four times a day (QID) | ORAL | 0 refills | Status: DC | PRN

## 2023-08-12 NOTE — ED Triage Notes (Signed)
Patient to ED via POV for multiple fall- sent by PCP. States she tripped and fell this AM- also did the same on Friday. Bruising noted under left eye and skin tear to left hand. Had headache since Friday when she fell. Denies LOC and blood thinners. Also having left rib pain.

## 2023-08-12 NOTE — Telephone Encounter (Signed)
Patient had fall Friday afternoon. She states that she tripped going up stairs hit both arm and face. States left are is tore up pretty back still bleeding and skin is sticking to it. Skin tore off not deep cut. Hit left side of face. She is having swelling and bruising on face. She has had ongoing headache, denies any vision changes or sensitivity to light or sound. Denies any memory or thought issues.  Pain is on whole left side of head and described as dull sharp pain. Patient had another fall this morning tripped on edge of bed and hit left arm again.  Patient is having  pain on left arm b/l knees, ribs and left side of head. States there is an area about 4 inches long on left arm that she has pulled skin back.    Patient concern is arm abrasion and pain in ribs causing increased shortness of breath. Pain is 7/10 and describes as constant pain that increased with movement.    Advised patient she needs to be seen at ER for evaluation. Agreed and will go to [facility] for evaluation now.

## 2023-08-12 NOTE — ED Provider Notes (Signed)
[facility] Provider Note    Event Date/Time   First MD Initiated Contact with Patient 08/12/23 1311     (approximate)   History   Fall   HPI  Morgan [name] is a 72 y.o. female   presents to the ED with complaint of multiple falls.  Patient states that she tripped and fell this morning and also lost her balance falling on Friday.  She called her PCP who extracted her to come to the emergency department.  Patient denies any LOC or the use of blood thinners.  She does endorse left-sided rib pain, left-sided facial pain, left hip pain and bilateral knee pain.  Patient also has a skin tear to her left hand.  Patient has a history of hypertension, CHF, CKD, COPD, GERD, MVP, Parkinson's disease, dementia, history of ischemic stroke, hemiaplasia affecting left nondominant side.      Physical Exam   Triage Vital Signs: ED Triage Vitals [08/12/23 1136]  Encounter Vitals Group     BP (!) 154/94     Systolic BP Percentile      Diastolic BP Percentile      Pulse Rate 74     Resp 18     Temp 98.2 F (36.8 C)     Temp Source Oral     SpO2 100 %     Weight 185 lb (83.9 kg)     Height 6\' 1"  (1.854 m)     Head Circumference      Peak Flow      Pain Score 7     Pain Loc      Pain Education      Exclude from Growth Chart     Most recent vital signs: Vitals:   08/12/23 1136  BP: (!) 154/94  Pulse: 74  Resp: 18  Temp: 98.2 F (36.8 C)  SpO2: 100%     General: Awake, no distress.  Alert, talkative, answers questions appropriately. CV:  Good peripheral perfusion.  Heart rate and rate and rhythm. Resp:  Normal effort.  Lungs are clear bilaterally.  Tenderness noted on palpation of the left ribs. Abd:  No distention.  Soft, flat, nontender, bowel sounds present x 4 quadrants. Other:  There is soft tissue edema and bruising noted to the left area.  PERRLA, EOMI's, no hyphema present.  Conjunctive of clear bilaterally.  Nontender cervical spine, thoracic and  lumbar spine to palpation posteriorly.  Patient does have tenderness on palpation of the left lateral ribs.  Left hip tenderness noted without lower extremity shortening or rotation.  Moderate tenderness on palpation of the knees bilaterally with light palpation anteriorly.   ED Results / Procedures / Treatments   Labs (all labs ordered are listed, but only abnormal results are displayed) Labs Reviewed  URINALYSIS, ROUTINE W REFLEX MICROSCOPIC - Abnormal; Notable for the following components:      Result Value   Color, Urine YELLOW (*)    APPearance HAZY (*)    Hgb urine dipstick SMALL (*)    Leukocytes,Ua MODERATE (*)    Bacteria, UA RARE (*)    All other components within normal limits  CBC WITH DIFFERENTIAL/PLATELET - Abnormal; Notable for the following components:   RBC 3.74 (*)    Hemoglobin 10.5 (*)    HCT 32.8 (*)    All other components within normal limits  BASIC METABOLIC PANEL - Abnormal; Notable for the following components:   BUN 27 (*)    Creatinine, Ser 1.10 (*)  Calcium 8.8 (*)    GFR, Estimated 54 (*)    All other components within normal limits     RADIOLOGY CT head without contrast per radiologist is negative for acute intracranial abnormality.  Chronic small vessel ischemia present.  X-ray images of the left ribs per radiology shows acute fracture of the anterior left ninth rib and multiple old fractures on the left.  X-ray images of the right knee were reviewed and interpreted by myself independent of the radiologist and was negative for fracture or dislocation.  X-ray images of the left knee also reviewed and interpreted by myself independent of the radiologist and was found to be negative for fracture or dislocation.  Left hip x-ray with 1 view pelvis was reviewed and interpreted by myself independent of the radiologist and was negative for fracture or dislocation.  PROCEDURES:  Critical Care performed:   Procedures   MEDICATIONS ORDERED IN  ED: Medications - No data to display   IMPRESSION / MDM / ASSESSMENT AND PLAN / ED COURSE  I reviewed the triage vital signs and the nursing notes.   Differential diagnosis includes, but is not limited to, head injury, contusion, skull fracture, subdural, left hip fracture, dislocation, contusion, bilateral knee contusion, fracture, dislocation, facial fracture, left facial contusion, left rib fracture versus multiple rib fractures versus contusion.  72 year old female presents to the ED for evaluation of multiple falls after she spoke with her PCP was advised to be evaluated in the emergency department.  Patient reports that she has problems with her balance since having her CVA.  She denies any loss of consciousness or blood thinners but continues to have various pains.  She was made aware that her CT was negative and that her left rib x-rays did show 1 fracture. ----------------------------------------- 4:43 PM on 08/12/2023 ----------------------------------------- At this time patient is being turned over to Greig Right, PA-C while we are waiting on the results of her radiology reads for her CT cervical spine, maxillofacial, bilateral knees and left hip.     Patient's presentation is most consistent with acute presentation with potential threat to life or bodily function.  FINAL CLINICAL IMPRESSION(S) / ED DIAGNOSES   Final diagnoses:  Closed fracture of one rib of left side, initial encounter     Rx / DC Orders   ED Discharge Orders     None        Note:  This document was prepared using Dragon voice recognition software and may include unintentional dictation errors.   Tommi Rumps, PA-C 08/12/23 1644    Trinna Post, MD 08/14/23 0001

## 2023-08-12 NOTE — Telephone Encounter (Signed)
With rib pain with SOB and head injury, agree with disposition. I do see she has went to Ed.

## 2023-08-12 NOTE — ED Notes (Signed)
See triage notes. Patient stated she fell on Friday and today. Around left eye is bruised and left arm is bandaged up. Patient stated she went to her PCP and was sent here.

## 2023-08-12 NOTE — ED Provider Notes (Signed)
  Physical Exam  BP (!) 154/94 (BP Location: Left Arm)   Pulse 74   Temp 98.2 F (36.8 C) (Oral)   Resp 18   Ht 6\' 1"  (1.854 m)   Wt 83.9 kg   SpO2 100%   BMI 24.41 kg/m   Physical Exam  Procedures  Procedures  ED Course / MDM    Medical Decision Making Amount and/or Complexity of Data Reviewed Labs: ordered. Radiology: ordered.  Risk Prescription drug management.   I did assumed care of patient at shift change from Bridget Hartshorn, [place].  In short I am waiting for her x-ray and CT results.  Patient has 1/9 rib fracture.  CT of the head, C-spine and maxillofacial were independently reviewed interpreted by me by reading radiologist report as being negative for any acute abnormality  X-rays of the right and left knee, left hip, were independently reviewed interpreted by me as being negative for any acute abnormality.  Left rib x-ray does show 1/9 rib fracture  I did explain everything to the patient.  She was offered pain medication here but she is driving so we will send medication to the pharmacy.  She is to follow-up with her regular doctor as needed.  Return if worsening.  She is in agreement treatment plan.  Discharged stable condition.       Faythe Ghee, PA-C 08/12/23 1711    Dionne Bucy, MD 08/13/23 [phone]

## 2023-08-13 ENCOUNTER — Encounter: Payer: Self-pay | Admitting: *Deleted

## 2023-08-14 LAB — URINE CULTURE: Culture: >=100000 — AB

## 2023-08-15 NOTE — Progress Notes (Signed)
ED Antimicrobial Stewardship Positive Culture Follow Up   Morgan [name] is an 72 y.o. female who presented to [facility] on 08/12/2023 with a chief complaint of  Chief Complaint  Patient presents with   Fall    Recent Results (from the past 720 hour(s))  Urine Culture     Status: Abnormal   Collection Time: 08/12/23  2:53 PM   Specimen: Urine, Clean Catch  Result Value Ref Range Status   Specimen Description   Final    URINE, CLEAN CATCH Performed at [facility], [address] [place], [place] [address]    Special Requests   Final    NONE Performed at [facility], [address] [place], [place] [address]    Culture >=100,000 COLONIES/mL ESCHERICHIA COLI (A)  Final   Report Status 08/14/2023 FINAL  Final   Organism ID, Bacteria ESCHERICHIA COLI (A)  Final      Susceptibility   Escherichia coli - MIC*    AMPICILLIN >=32 RESISTANT Resistant     CEFAZOLIN 32 INTERMEDIATE Intermediate     CEFEPIME <=0.12 SENSITIVE Sensitive     CEFTRIAXONE 1 SENSITIVE Sensitive     CIPROFLOXACIN <=0.25 SENSITIVE Sensitive     GENTAMICIN <=1 SENSITIVE Sensitive     IMIPENEM <=0.25 SENSITIVE Sensitive     NITROFURANTOIN <=16 SENSITIVE Sensitive     TRIMETH/SULFA <=20 SENSITIVE Sensitive     AMPICILLIN/SULBACTAM >=32 RESISTANT Resistant     PIP/TAZO 16 SENSITIVE Sensitive     * >=100,000 COLONIES/mL ESCHERICHIA COLI    ED Provider: Dionne Bucy  Patient presents to the ED with complaint of multiple falls but with no urinary symptoms. Ucx obtained which showed >100k colonies E.coli. Patient with history of recurrent e.coli UTIs(6 positive urine cultures since 2018) and this is likely a colonization. Discussed findings with EDP who agreed. I called the patient to see if they were having any symptoms and patient stated she was not. Will continue to defe antibiotics at this time.  Bettey Costa ,[facility] Clinical Pharmacist  08/15/2023, 11:50 AM

## 2023-08-20 ENCOUNTER — Telehealth: Payer: Self-pay | Admitting: Family

## 2023-08-20 ENCOUNTER — Ambulatory Visit: Payer: Medicare HMO | Admitting: Anesthesiology

## 2023-08-20 ENCOUNTER — Encounter
Admission: RE | Disposition: A | Discharge status: Home / Self Care | Payer: Self-pay | Source: Home / Self Care | Attending: Gastroenterology

## 2023-08-20 ENCOUNTER — Ambulatory Visit
Admission: RE | Admit: 2023-08-20 | Discharge: 2023-08-20 | Disposition: A | Discharge status: Home / Self Care | Payer: Medicare HMO | Attending: Gastroenterology | Admitting: Gastroenterology

## 2023-08-20 DIAGNOSIS — K640 First degree hemorrhoids: Secondary | ICD-10-CM | POA: Insufficient documentation

## 2023-08-20 DIAGNOSIS — I341 Nonrheumatic mitral (valve) prolapse: Secondary | ICD-10-CM | POA: Insufficient documentation

## 2023-08-20 DIAGNOSIS — I130 Hypertensive heart and chronic kidney disease with heart failure and stage 1 through stage 4 chronic kidney disease, or unspecified chronic kidney disease: Secondary | ICD-10-CM | POA: Diagnosis not present

## 2023-08-20 DIAGNOSIS — Z86010 Personal history of colonic polyps: Secondary | ICD-10-CM | POA: Diagnosis not present

## 2023-08-20 DIAGNOSIS — Z9981 Dependence on supplemental oxygen: Secondary | ICD-10-CM | POA: Insufficient documentation

## 2023-08-20 DIAGNOSIS — D124 Benign neoplasm of descending colon: Secondary | ICD-10-CM | POA: Insufficient documentation

## 2023-08-20 DIAGNOSIS — D120 Benign neoplasm of cecum: Secondary | ICD-10-CM | POA: Diagnosis not present

## 2023-08-20 DIAGNOSIS — Z87891 Personal history of nicotine dependence: Secondary | ICD-10-CM | POA: Diagnosis not present

## 2023-08-20 DIAGNOSIS — K635 Polyp of colon: Secondary | ICD-10-CM | POA: Diagnosis not present

## 2023-08-20 DIAGNOSIS — S2232XA Fracture of one rib, left side, initial encounter for closed fracture: Secondary | ICD-10-CM

## 2023-08-20 DIAGNOSIS — J449 Chronic obstructive pulmonary disease, unspecified: Secondary | ICD-10-CM | POA: Diagnosis not present

## 2023-08-20 DIAGNOSIS — K6389 Other specified diseases of intestine: Secondary | ICD-10-CM | POA: Diagnosis not present

## 2023-08-20 DIAGNOSIS — K219 Gastro-esophageal reflux disease without esophagitis: Secondary | ICD-10-CM | POA: Diagnosis not present

## 2023-08-20 DIAGNOSIS — G20A1 Parkinson's disease without dyskinesia, without mention of fluctuations: Secondary | ICD-10-CM | POA: Diagnosis not present

## 2023-08-20 DIAGNOSIS — K5730 Diverticulosis of large intestine without perforation or abscess without bleeding: Secondary | ICD-10-CM | POA: Diagnosis not present

## 2023-08-20 DIAGNOSIS — F32A Depression, unspecified: Secondary | ICD-10-CM | POA: Diagnosis not present

## 2023-08-20 DIAGNOSIS — K649 Unspecified hemorrhoids: Secondary | ICD-10-CM | POA: Diagnosis not present

## 2023-08-20 DIAGNOSIS — I509 Heart failure, unspecified: Secondary | ICD-10-CM | POA: Insufficient documentation

## 2023-08-20 DIAGNOSIS — Z1211 Encounter for screening for malignant neoplasm of colon: Secondary | ICD-10-CM | POA: Insufficient documentation

## 2023-08-20 DIAGNOSIS — N189 Chronic kidney disease, unspecified: Secondary | ICD-10-CM | POA: Diagnosis not present

## 2023-08-20 DIAGNOSIS — E785 Hyperlipidemia, unspecified: Secondary | ICD-10-CM | POA: Diagnosis not present

## 2023-08-20 DIAGNOSIS — Z8673 Personal history of transient ischemic attack (TIA), and cerebral infarction without residual deficits: Secondary | ICD-10-CM | POA: Insufficient documentation

## 2023-08-20 HISTORY — PX: SUBMUCOSAL TATTOO INJECTION: SHX6856

## 2023-08-20 HISTORY — PX: POLYPECTOMY: SHX5525

## 2023-08-20 HISTORY — PX: HEMOSTASIS CLIP PLACEMENT: SHX6857

## 2023-08-20 HISTORY — PX: COLONOSCOPY WITH PROPOFOL: SHX5780

## 2023-08-20 SURGERY — COLONOSCOPY WITH PROPOFOL
Anesthesia: General

## 2023-08-20 MED ORDER — PROPOFOL 10 MG/ML IV BOLUS
INTRAVENOUS | Status: DC | PRN
Start: 2023-08-20 — End: 2023-08-20
  Administered 2023-08-20: 80 mg via INTRAVENOUS

## 2023-08-20 MED ORDER — LIDOCAINE HCL (CARDIAC) PF 100 MG/5ML IV SOSY
PREFILLED_SYRINGE | INTRAVENOUS | Status: DC | PRN
  Administered 2023-08-20: 80 mg via INTRAVENOUS

## 2023-08-20 MED ORDER — SODIUM CHLORIDE 0.9 % IV SOLN
INTRAVENOUS | Status: DC
  Administered 2023-08-20: 1000 mL via INTRAVENOUS

## 2023-08-20 MED ORDER — PROPOFOL 500 MG/50ML IV EMUL
INTRAVENOUS | Status: DC | PRN
  Administered 2023-08-20: 150 ug/kg/min via INTRAVENOUS

## 2023-08-20 NOTE — Interval H&P Note (Signed)
History and Physical Interval Note:  08/20/2023 12:38 PM  Morgan [name]  has presented today for surgery, with the diagnosis of PH Colon Polyps.  The various methods of treatment have been discussed with the patient and family. After consideration of risks, benefits and other options for treatment, the patient has consented to  Procedure(s): COLONOSCOPY WITH PROPOFOL (N/A) as a surgical intervention.  The patient's history has been reviewed, patient examined, no change in status, stable for surgery.  I have reviewed the patient's chart and labs.  Questions were answered to the patient's satisfaction.     Regis Bill  Ok to proceed with colonoscopy

## 2023-08-20 NOTE — Telephone Encounter (Signed)
Pt needs hospital fu

## 2023-08-20 NOTE — H&P (Signed)
Outpatient short stay form Pre-procedure 08/20/2023  Regis Bill, MD  Primary Physician: Mort Sawyers, FNP  Reason for visit:  Surveillance colonoscopy  History of present illness:    72 y/o lady with history of COPD, CKD, and parkinson's here for surveillance colonoscopy. Last colonoscopy in 2022 with 12 mm SSA with polyp present at cauterization edges. No blood thinners. No family history of GI malignancies. History of cholecystectomy and hysterectomy.    Current Facility-Administered Medications:    0.9 %  sodium chloride infusion, , Intravenous, Continuous, Lasheba Stevens, Rossie Muskrat, MD, Last Rate: 20 mL/hr at 08/20/23 1147, 1,000 mL at 08/20/23 1147  Medications Prior to Admission  Medication Sig Dispense Refill Last Dose   acetaminophen (TYLENOL) 325 MG tablet Take 2 tablets (650 mg total) by mouth every 4 (four) hours as needed for mild pain (or temp > 37.5 C (99.5 F)).   08/19/2023   carbidopa-levodopa (SINEMET IR) 25-100 MG tablet Take 2 tablets by mouth 3 (three) times daily.   08/19/2023   cyanocobalamin (VITAMIN B12) 100 MCG tablet Take 100 mcg by mouth daily.   08/19/2023   folic acid (FOLVITE) 1 MG tablet TAKE 1 TABLET BY MOUTH EVERY DAY 90 tablet 1 08/19/2023   HYDROcodone-acetaminophen (NORCO/VICODIN) 5-325 MG tablet Take 1 tablet by mouth every 6 (six) hours as needed for moderate pain. 15 tablet 0 08/19/2023   hydrOXYzine (VISTARIL) 50 MG capsule Take 1 capsule (50 mg total) by mouth 3 (three) times daily as needed. 30 capsule 0 08/19/2023   ibuprofen (ADVIL) 400 MG tablet Take 1 tablet (400 mg total) by mouth every 8 (eight) hours as needed for headache or moderate pain. 20 tablet 0 Past Week   OXYGEN Inhale 3 L/min into the lungs See admin instructions. 3 L/min at bedtime and as needed for shortness of breath throughout the day   08/20/2023   pantoprazole (PROTONIX) 20 MG tablet Take by mouth.   08/19/2023   potassium chloride SA (KLOR-CON M) 20 MEQ tablet Take 1 tablet (20 mEq  total) by mouth daily. 90 tablet 1 08/19/2023   promethazine (PHENERGAN) 12.5 MG tablet Take 1 tablet (12.5 mg total) by mouth every 12 (twelve) hours as needed for nausea or vomiting. 20 tablet 0 08/19/2023   topiramate (TOPAMAX) 50 MG tablet TAKE 1 TABLET BY MOUTH EVERYDAY AT BEDTIME 90 tablet 0 08/19/2023   albuterol (VENTOLIN HFA) 108 (90 Base) MCG/ACT inhaler Inhale 2 puffs into the lungs every 6 (six) hours as needed for wheezing or shortness of breath. 1 each 11  at prn   Budeson-Glycopyrrol-Formoterol (BREZTRI AEROSPHERE) 160-9-4.8 MCG/ACT AERO Inhale 2 puffs into the lungs in the morning and at bedtime. 1 each 11  at prn   EPINEPHrine 0.3 mg/0.3 mL IJ SOAJ injection Inject 0.3 mg into the muscle as needed for anaphylaxis. 1 each 1  at prn   sertraline (ZOLOFT) 100 MG tablet Take 1 tablet (100 mg total) by mouth daily. (Patient not taking: Reported on 08/20/2023) 90 tablet 0 Not Taking     Allergies  Allergen Reactions   Angiotensin Receptor Blockers Swelling    Angioedema   Meperidine Hives and Anaphylaxis   Strawberry Extract Hives and Swelling   Sucralfate Nausea Only and Other (See Comments)    Severe nausea Other reaction(s): Other (See Comments) Severe nausea Severe nausea   Wasp Venom Hives     Past Medical History:  Diagnosis Date   Allergy    Anemia    Anxiety  Arthritis    CHF (congestive heart failure) (HCC)    Chronic kidney disease    COPD (chronic obstructive pulmonary disease) (HCC)    Depression    GERD (gastroesophageal reflux disease)    Headache    Hyperlipidemia    Hypertension    MVP (mitral valve prolapse)    Parkinson disease    Vascular dementia (HCC)     Review of systems:  Otherwise negative.    Physical Exam  Gen: Alert, oriented. Appears stated age.  HEENT: PERRLA. Lungs: No respiratory distress CV: RRR Abd: soft, benign, no masses Ext: No edema    Planned procedures: Proceed with colonoscopy. The patient understands the nature  of the planned procedure, indications, risks, alternatives and potential complications including but not limited to bleeding, infection, perforation, damage to internal organs and possible oversedation/side effects from anesthesia. The patient agrees and gives consent to proceed.  Please refer to procedure notes for findings, recommendations and patient disposition/instructions.     Regis Bill, MD [facility]

## 2023-08-20 NOTE — Telephone Encounter (Signed)
LM for pt to return call x 1 

## 2023-08-20 NOTE — Transfer of Care (Signed)
Immediate Anesthesia Transfer of Care Note  Patient: BRENE SESSIONS  Procedure(s) Performed: COLONOSCOPY WITH PROPOFOL POLYPECTOMY  Patient Location: PACU  Anesthesia Type:General  Level of Consciousness: sedated  Airway & Oxygen Therapy: Patient Spontanous Breathing and Patient connected to nasal cannula oxygen  Post-op Assessment: Report given to RN and Post -op Vital signs reviewed and stable  Post vital signs: Reviewed and stable  Last Vitals:  Vitals Value Taken Time  BP 106/65 08/20/23 1316  Temp 36.2 C 08/20/23 1316  Pulse 59 08/20/23 1316  Resp 15 08/20/23 1316  SpO2 100 % 08/20/23 1316    Last Pain:  Vitals:   08/20/23 1316  TempSrc: Temporal  PainSc: Asleep         Complications: No notable events documented.

## 2023-08-20 NOTE — Anesthesia Preprocedure Evaluation (Addendum)
Anesthesia Evaluation  Patient identified by MRN, date of birth, ID band Patient awake    Reviewed: Allergy & Precautions, NPO status , Patient's Chart, lab work & pertinent test results  Airway Mallampati: II  TM Distance: >3 FB Neck ROM: full    Dental  (+) Edentulous Upper, Edentulous Lower   Pulmonary COPD, former smoker Pt normally oxygen dependent at night. She reports falling a week ago and breaking a rib on the left side that has caused chest side pain and an oxygen requirement of 4 L [place]. ED note reviewed  4  L  Patient able to take a deep breath but has slight distressed facial expression with maximum inspiration         Cardiovascular Exercise Tolerance: Poor hypertension, Pt. on medications +CHF (diastolic) and + DOE  Normal cardiovascular exam Rhythm:Regular     Neuro/Psych  Headaches, Seizures -,   Anxiety Depression    Neurology care for HA, seizure like activity, Parkinson's, CVA CVA (Left side weakness), Residual Symptoms  negative psych ROS   GI/Hepatic Neg liver ROS, PUD,GERD  Poorly Controlled,,  Endo/Other  negative endocrine ROS    Renal/GU Renal InsufficiencyRenal disease  negative genitourinary   Musculoskeletal   Abdominal  (+) + obese  Peds negative pediatric ROS (+)  Hematology  (+) Blood dyscrasia, anemia   Anesthesia Other Findings Past Medical History: No date: Allergy No date: Anemia No date: Anxiety No date: Arthritis No date: CHF (congestive heart failure) (HCC) No date: Depression No date: GERD (gastroesophageal reflux disease) No date: Hypertension No date: MVP (mitral valve prolapse) No date: Parkinson disease [facility])  Past Surgical History: No date: ABDOMINAL HYSTERECTOMY No date: APPENDECTOMY 04/21/2020: BALLOON DILATION; N/A     Comment:  Procedure: BALLOON DILATION;  Surgeon: Rachael Fee,              MD;  Location: [facility];  Service: Endoscopy;                 Laterality: N/A;  pyloric/ GI 07/04/2020: BALLOON DILATION; N/A     Comment:  Procedure: BALLOON DILATION;  Surgeon: Napoleon Form, MD;  Location: [facility];  Service: Endoscopy;                Laterality: N/A; 04/21/2020: BIOPSY     Comment:  Procedure: BIOPSY;  Surgeon: Rachael Fee, MD;                Location: [facility];  Service: Endoscopy;; 07/04/2020: BIOPSY     Comment:  Procedure: BIOPSY;  Surgeon: Napoleon Form, MD;                Location: [facility];  Service: Endoscopy;;  EGD and               COLON 1988: BREAST SURGERY; Right     Comment:  tumor removal 05/18/2020: CHOLECYSTECTOMY; N/A     Comment:  Procedure: LAPAROSCOPIC CHOLECYSTECTOMY;  Surgeon:               Luretha Murphy, MD;  Location: [facility];  Service:               General;  Laterality: N/A; 07/04/2020: COLONOSCOPY WITH PROPOFOL; N/A     Comment:  Procedure: COLONOSCOPY WITH PROPOFOL;  Surgeon:               Napoleon Form, MD;  [facility];                Service: Endoscopy;  Laterality: N/A; 11/03/2021: COLONOSCOPY WITH PROPOFOL; N/A     Comment:  Procedure: COLONOSCOPY WITH PROPOFOL;  Surgeon:               Regis Bill, MD;  Location: [facility] ENDOSCOPY;                Service: Endoscopy;  Laterality: N/A; 08/11/2020: ESOPHAGEAL DILATION     Comment:  Procedure: PYLORIC DILATION;  Surgeon: Sherrilyn Rist, MD;  Location: [facility];  Service:               Gastroenterology;; 04/21/2020: ESOPHAGOGASTRODUODENOSCOPY (EGD) WITH PROPOFOL; N/A     Comment:  Procedure: ESOPHAGOGASTRODUODENOSCOPY (EGD) WITH               PROPOFOL;  Surgeon: Rachael Fee, MD;  [facility];  Service: Endoscopy;  Laterality: N/A; 07/04/2020: ESOPHAGOGASTRODUODENOSCOPY (EGD) WITH PROPOFOL; N/A     Comment:  Procedure: ESOPHAGOGASTRODUODENOSCOPY (EGD) WITH               PROPOFOL;  Surgeon: Napoleon Form, MD;  [facility]:                [facility];  Service: Endoscopy;  Laterality: N/A; 08/11/2020: ESOPHAGOGASTRODUODENOSCOPY (EGD) WITH PROPOFOL; N/A     Comment:  Procedure: ESOPHAGOGASTRODUODENOSCOPY (EGD) WITH               PROPOFOL;  Surgeon: Sherrilyn Rist, MD;  [facility];  Service: Gastroenterology;  Laterality: N/A; 11/03/2021: ESOPHAGOGASTRODUODENOSCOPY (EGD) WITH PROPOFOL; N/A     Comment:  Procedure: ESOPHAGOGASTRODUODENOSCOPY (EGD) WITH               PROPOFOL;  Surgeon: Regis Bill, MD;  Location:               [facility] END[facility];  Service: Endoscopy;  Laterality: N/A; 2005: KNEE SURGERY     Comment:  2 times left No date: KNEE SURGERY; Right 07/20/2021: KYPHOPLASTY; N/A     Comment:  Procedure: T 10KYPHOPLASTY;  Surgeon: Kennedy Bucker, MD;              Location: [facility] ORS;  Service: Orthopedics;  Laterality:               N/A; 08/23/2021: KYPHOPLASTY; N/A     Comment:  Procedure: T9 KYPHOPLASTY;  Surgeon: Kennedy Bucker, MD;               Location: [facility] ORS;  Service: Orthopedics;  Laterality:               N/A; 07/04/2020: POLYPECTOMY     Comment:  Procedure: POLYPECTOMY;  Surgeon: Napoleon Form,               MD;  [facility];  Service: Endoscopy;; No date: TUBAL LIGATION  BMI    Body Mass Index: 26.39 kg/m      Reproductive/Obstetrics negative OB ROS                             Anesthesia Physical Anesthesia Plan  ASA: 3  Anesthesia Plan: General   Post-op Pain Management:    Induction: Intravenous  PONV Risk Score and Plan: Propofol infusion and TIVA  Airway Management Planned: Natural Airway  Additional Equipment:   Intra-op Plan:   Post-operative Plan:   Informed Consent: I have reviewed the patients History and Physical, chart, labs and discussed the procedure including the risks, benefits and alternatives for the proposed anesthesia with the patient or authorized representative who has indicated his/her  understanding and acceptance.     Dental Advisory Given  Plan Discussed with: CRNA and Surgeon  Anesthesia Plan Comments: (I recommended to patient to reschedule procedure to allow time for her chest wall injury recovery. She refused and states she wants to proceed. She understands the risk of organ damage that can occur with low oxygen circulation.)        Anesthesia Quick Evaluation

## 2023-08-20 NOTE — Telephone Encounter (Signed)
Patient has been scheduled. She was wanting to know if this refill will be sent in. Please advise.

## 2023-08-20 NOTE — Op Note (Signed)
[facility] Gastroenterology Patient Name: Morgan [name] Procedure Date: 08/20/2023 12:13 PM MRN: [id number] Account #: [id number] Date of Birth: Apr 26, 1951 Admit Type: Outpatient Age: 72 Room: [facility] ENDO ROOM 1 Gender: Female Note Status: Finalized Instrument Name: Nelda Marseille [id number] Procedure:             Colonoscopy Indications:           Surveillance: History of piecemeal removal adenoma on                         last colonoscopy (< 3 yrs) Providers:             Eather Colas MD, MD Referring MD:          Eather Colas MD, MD (Referring MD), Mort Sawyers                         (Referring MD) Medicines:             Monitored Anesthesia Care Complications:         No immediate complications. Estimated blood loss:                         Minimal. Procedure:             Pre-Anesthesia Assessment:                        - Prior to the procedure, a History and Physical was                         performed, and patient medications and allergies were                         reviewed. The patient is competent. The risks and                         benefits of the procedure and the sedation options and                         risks were discussed with the patient. All questions                         were answered and informed consent was obtained.                         Patient identification and proposed procedure were                         verified by the physician, the nurse, the                         anesthesiologist, the anesthetist and the technician                         in the endoscopy suite. Mental Status Examination:                         alert and oriented. Airway Examination: normal  oropharyngeal airway and neck mobility. Respiratory                         Examination: clear to auscultation. CV Examination:                         normal. Prophylactic Antibiotics: The patient does not                         require  prophylactic antibiotics. Prior                         Anticoagulants: The patient has taken no anticoagulant                         or antiplatelet agents. ASA Grade Assessment: III - A                         patient with severe systemic disease. After reviewing                         the risks and benefits, the patient was deemed in                         satisfactory condition to undergo the procedure. The                         anesthesia plan was to use monitored anesthesia care                         (MAC). Immediately prior to administration of                         medications, the patient was re-assessed for adequacy                         to receive sedatives. The heart rate, respiratory                         rate, oxygen saturations, blood pressure, adequacy of                         pulmonary ventilation, and response to care were                         monitored throughout the procedure. The physical                         status of the patient was re-assessed after the                         procedure.                        After obtaining informed consent, the colonoscope was                         passed under direct vision. Throughout the procedure,  the patient's blood pressure, pulse, and oxygen                         saturations were monitored continuously. The                         Colonoscope was introduced through the anus and                         advanced to the the cecum, identified by appendiceal                         orifice and ileocecal valve. The colonoscopy was                         performed without difficulty. The patient tolerated                         the procedure well. The quality of the bowel                         preparation was adequate to identify polyps. The                         ileocecal valve, appendiceal orifice, and rectum were                         photographed. Findings:      The  perianal and digital rectal examinations were normal.      A 3 mm polyp was found in the cecum. The polyp was sessile. The polyp       was removed with a cold snare. Resection and retrieval were complete.       Estimated blood loss was minimal.      A localized area of mildly nodular mucosa was found in the descending       colon. Given that it had a polypoid appearance, decision made was to       remove it. The polyp was removed with a piecemeal technique using a hot       snare. Resection and retrieval were complete. Estimated blood loss was       minimal. To prevent bleeding post-intervention, two hemostatic clips       were successfully placed. There was no bleeding during, or at the end,       of the procedure. Area was tattooed with an injection of 0.2 mL of [place]       ink.      A few small-mouthed diverticula were found in the sigmoid colon.      Internal hemorrhoids were found during retroflexion. The hemorrhoids       were Grade I (internal hemorrhoids that do not prolapse).      The exam was otherwise without abnormality on direct and retroflexion       views. Impression:            - One 3 mm polyp in the cecum, removed with a cold                         snare. Resected and retrieved.                        -  Nodular mucosa in the descending colon. Clips were                         placed. Tattooed.                        - Diverticulosis in the sigmoid colon.                        - Internal hemorrhoids.                        - The examination was otherwise normal on direct and                         retroflexion views. Recommendation:        - Discharge patient to home.                        - Resume previous diet.                        - Continue present medications.                        - Await pathology results.                        - Repeat colonoscopy for surveillance based on                         pathology results.                        - Return to  referring physician as previously                         scheduled. Procedure Code(s):     --- Professional ---                        [phone], Colonoscopy, flexible; with removal of                         tumor(s), polyp(s), or other lesion(s) by snare                         technique                        45381, Colonoscopy, flexible; with directed submucosal                         injection(s), any substance Diagnosis Code(s):     --- Professional ---                        Z86.010, Personal history of colonic polyps                        D12.0, Benign neoplasm of cecum                        K63.89, Other specified diseases of intestine  K64.0, First degree hemorrhoids                        K57.30, Diverticulosis of large intestine without                         perforation or abscess without bleeding CPT copyright 2022 American Medical Association. All rights reserved. The codes documented in this report are preliminary and upon coder review may  be revised to meet current compliance requirements. Eather Colas MD, MD 08/20/2023 1:18:41 PM Number of Addenda: 0 Note Initiated On: 08/20/2023 12:13 PM Scope Withdrawal Time: 0 hours 19 minutes 43 seconds  Total Procedure Duration: 0 hours 26 minutes 43 seconds  Estimated Blood Loss:  Estimated blood loss was minimal.      [facility]

## 2023-08-20 NOTE — Telephone Encounter (Signed)
Prescription Request  08/20/2023  LOV: 07/03/2023  What is the name of the medication or equipment? HYDROcodone-acetaminophen (NORCO/VICODIN) 5-325 MG tablet   Have you contacted your pharmacy to request a refill? No   Which pharmacy would you like this sent to?   [id number] - [place], [place] - [address] AT [facility] [address] [facility] [address] [place] [address] Phone: [phone] Fax: [phone]    Patient notified that their request is being sent to the clinical staff for review and that they should receive a response within 2 business days.   Please advise at [facility] [phone]  Patient was prescribed in the hospital for a broken rib after a fall,and would like to know if she could have a refill?

## 2023-08-21 ENCOUNTER — Encounter: Payer: Self-pay | Admitting: Gastroenterology

## 2023-08-21 ENCOUNTER — Encounter: Payer: Self-pay | Admitting: Family

## 2023-08-21 DIAGNOSIS — S2232XA Fracture of one rib, left side, initial encounter for closed fracture: Secondary | ICD-10-CM

## 2023-08-21 IMAGING — DX DG ABD PORTABLE 1V
2 series · 3 of 3 positions shown · non-contrast
Comparison: 08/10/2020

CLINICAL DATA: Possible small bowel obstruction. Abdominal pain and
dry heaving beginning today.

EXAM:
PORTABLE ABDOMEN - 1 VIEW

[abdomen supine (1 of 2)]
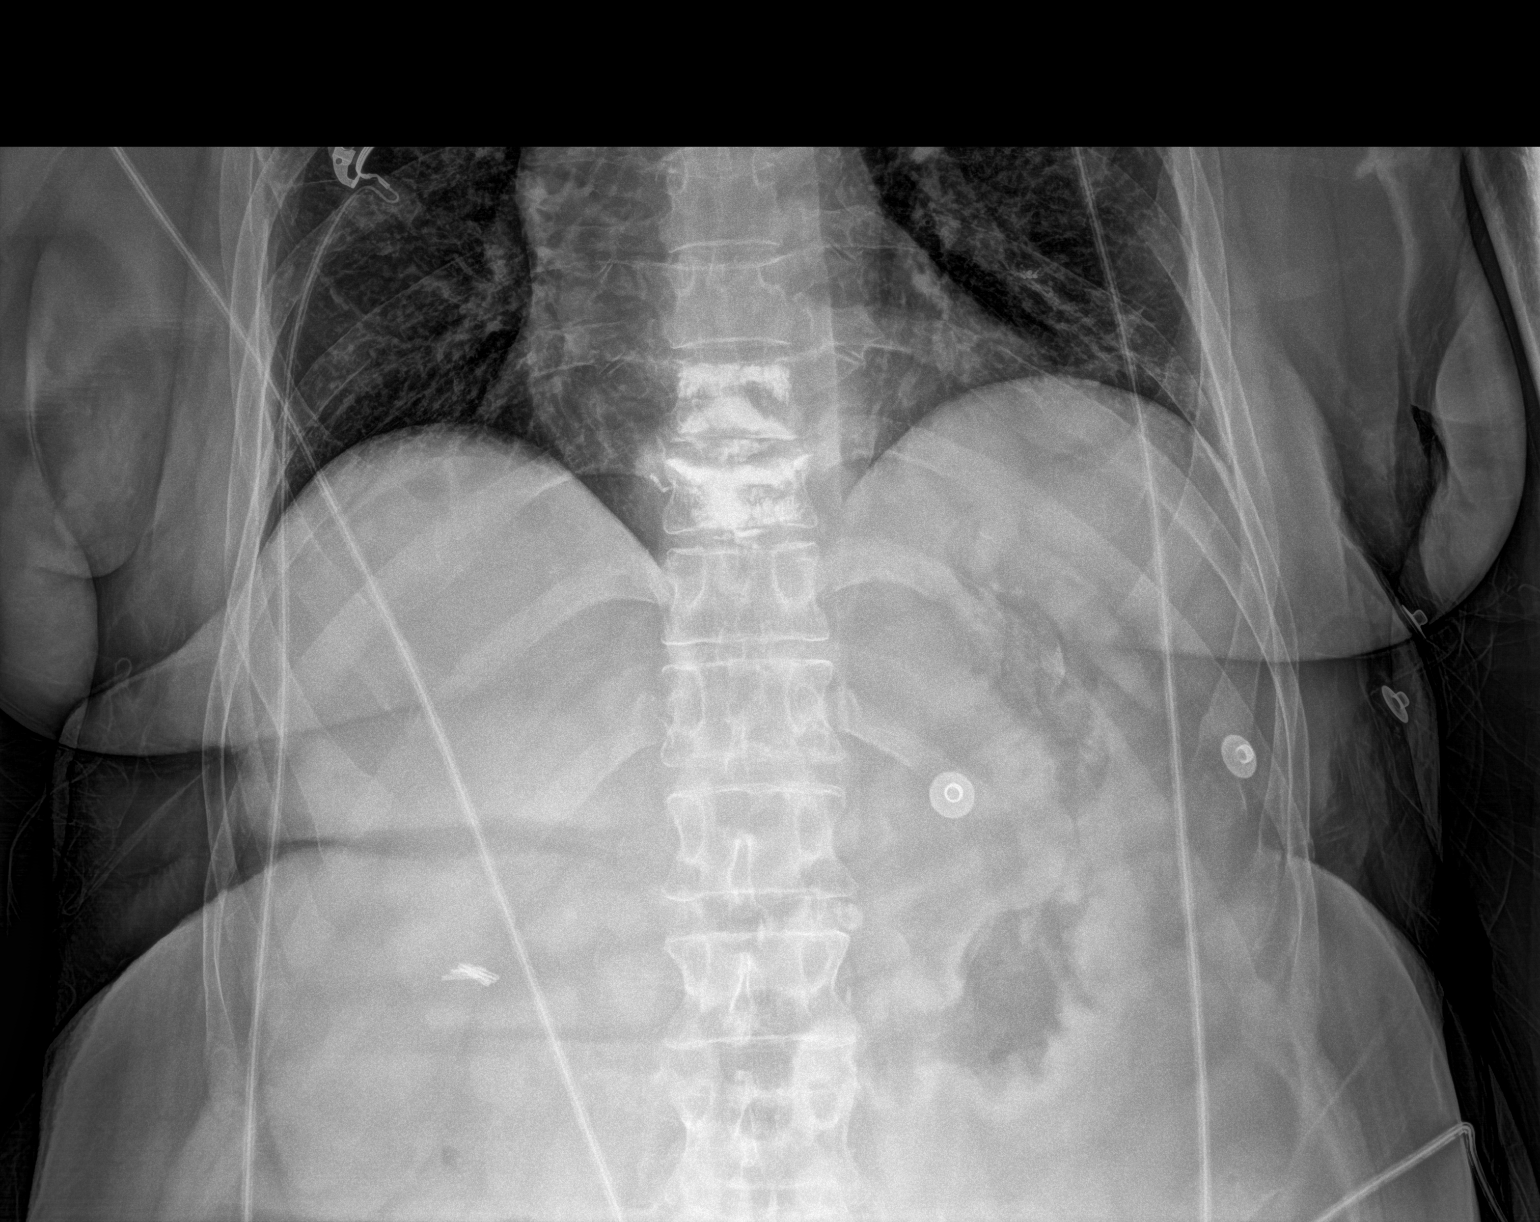

[Series 2: abdomen supine · 0.14mm/px · 2 of 2 slices shown (2 of 2)]
[im 1/2]
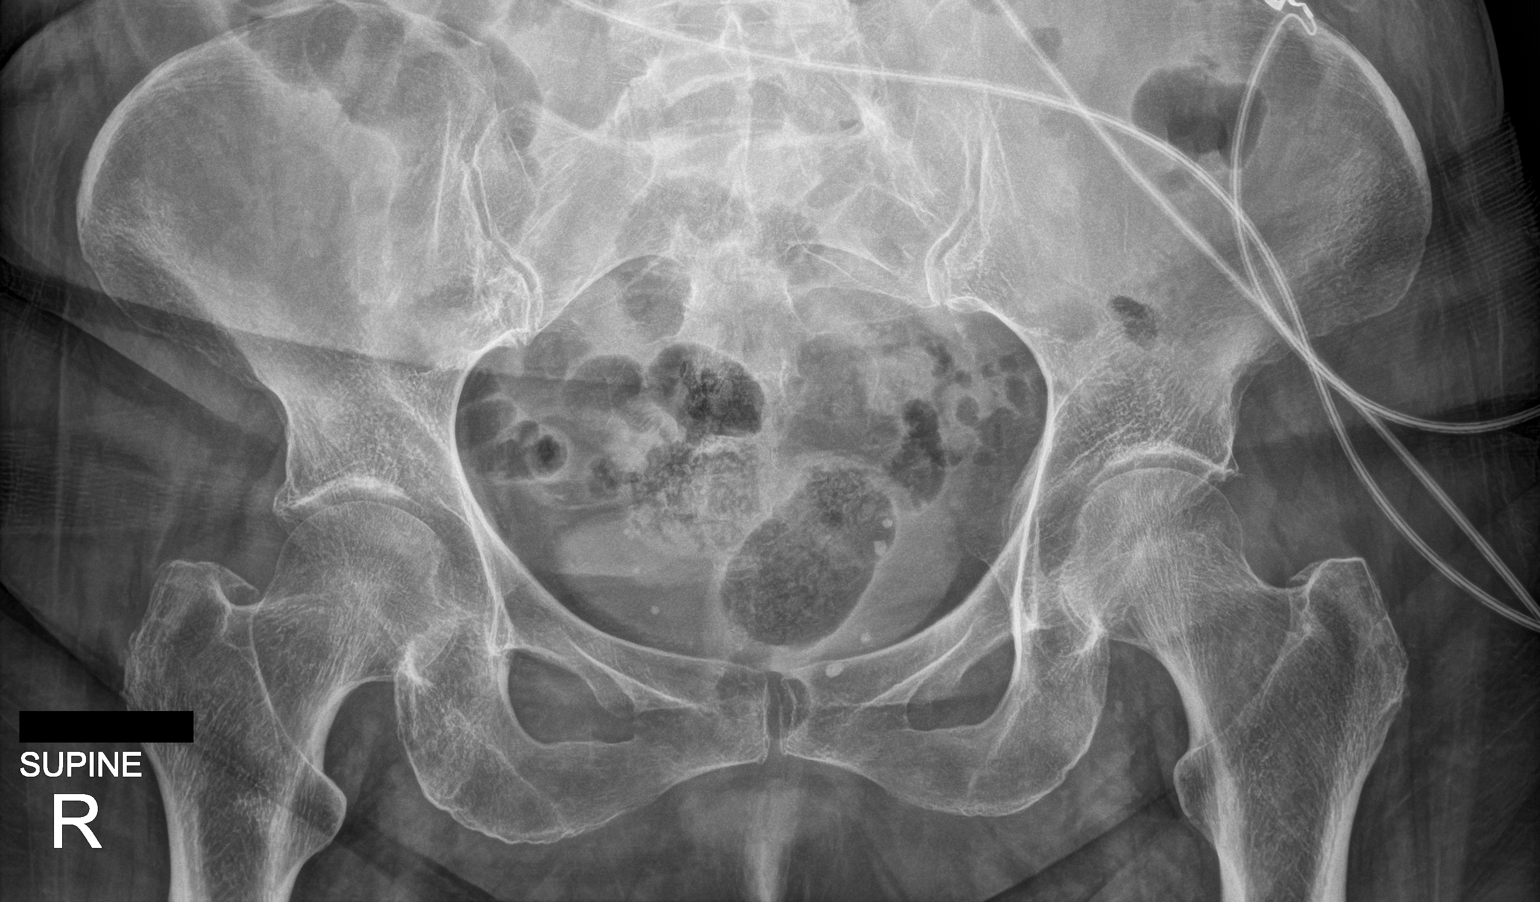
[im 2/2]
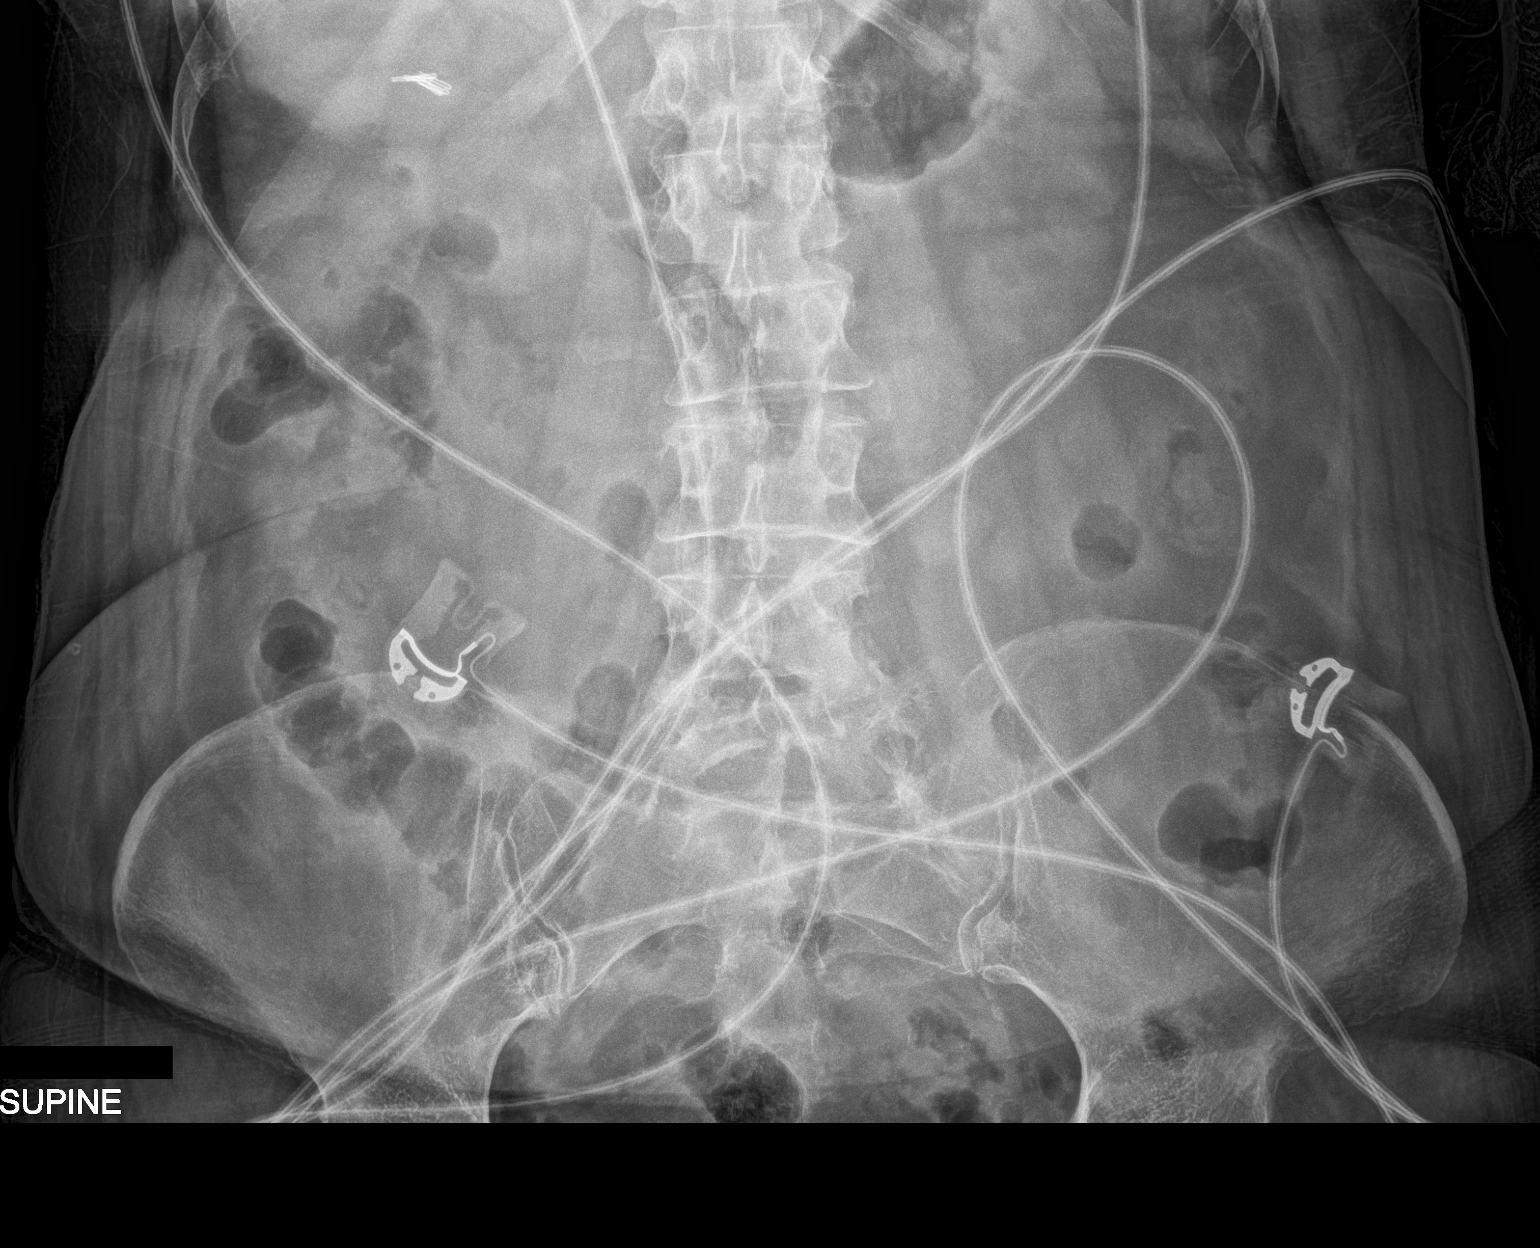

[3 of 3 positions shown; findings below may reference images not displayed]

FINDINGS: Bowel gas pattern is nonobstructive. Air and mild stool present
throughout the colon. No dilated small bowel loops. No free
peritoneal air. Previous kyphoplasties of T9 and T10. Several pelvic
phleboliths.
IMPRESSION: Nonobstructive bowel gas pattern.

## 2023-08-21 IMAGING — CT CT ABD-PELV W/ CM
2 of 5 series · 16 of 46 positions shown, 18 images · IV contrast (APPLIED)
Comparison: January 18, 2022.

CLINICAL DATA: Abdominal pain, vomiting.

EXAM:
CT ABDOMEN AND PELVIS WITH CONTRAST
TECHNIQUE: Multidetector CT imaging of the abdomen and pelvis was performed
using the standard protocol following bolus administration of
intravenous contrast.

[Series 2: abdomen 5.0 · axial · 0.94mm/px · z∈[-1276,-846]mm · 13 of 100 slices shown, 15 images]
[im 7/100  soft-tissue]
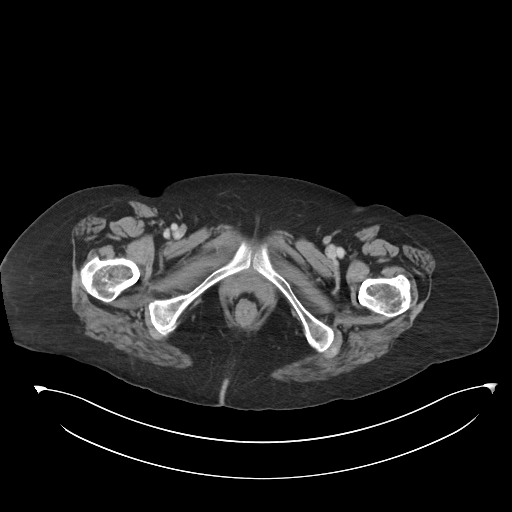
[im 7/100  bone]
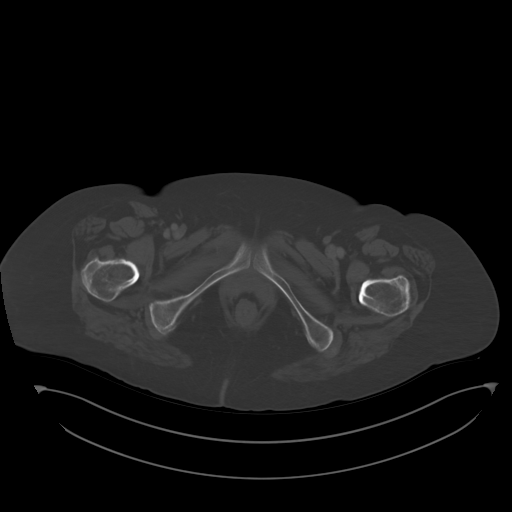
[im 13/100  soft-tissue]
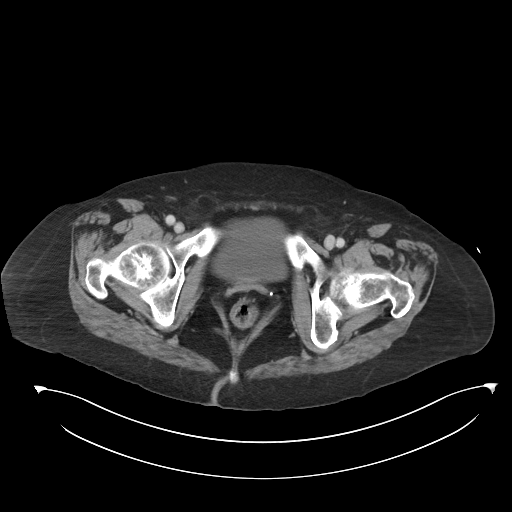
[im 19/100  soft-tissue]
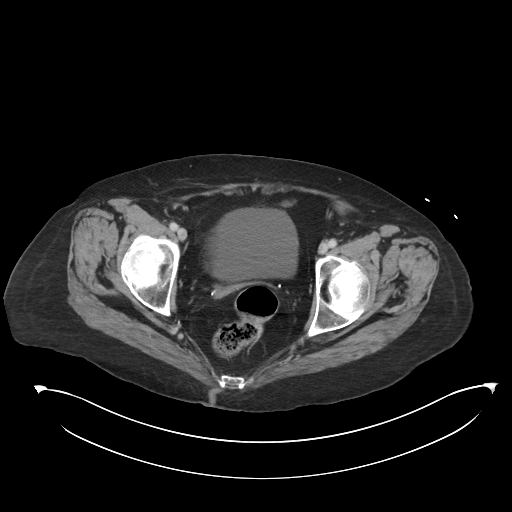
[im 31/100  soft-tissue]
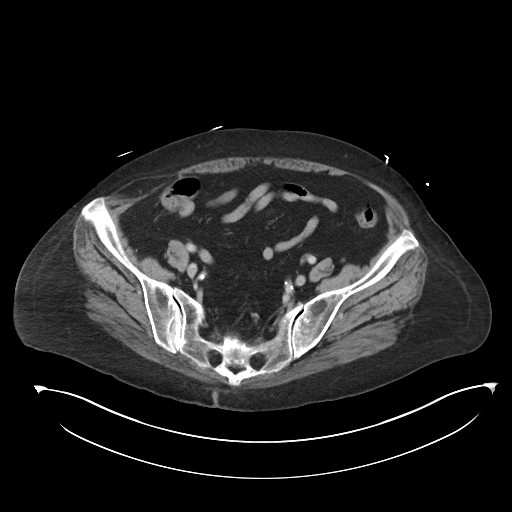
[im 38/100  soft-tissue]
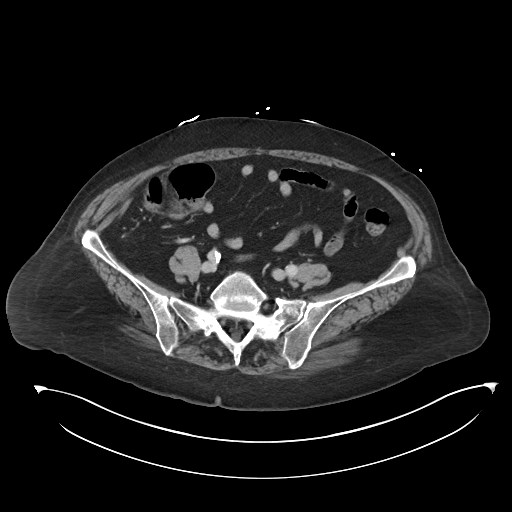
[im 44/100  soft-tissue]
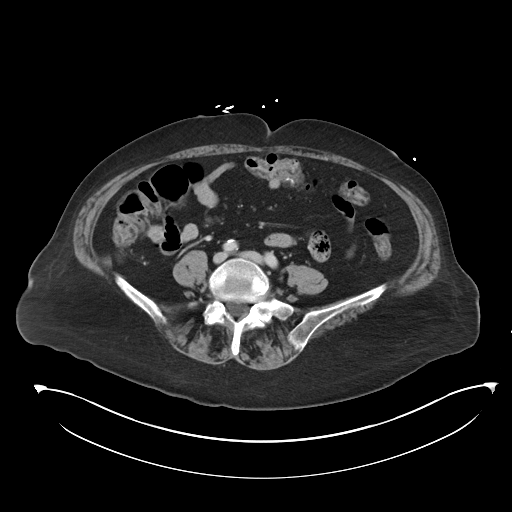
[im 50/100  soft-tissue]
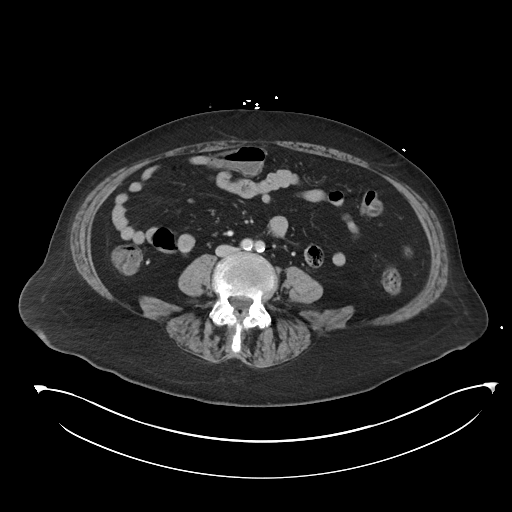
[im 56/100  soft-tissue]
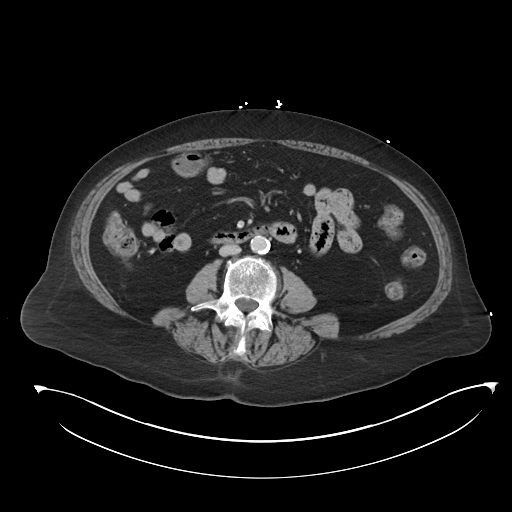
[im 62/100  soft-tissue]
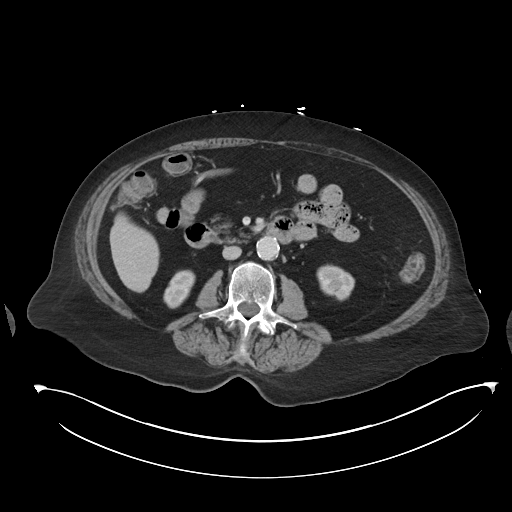
[im 62/100  bone]
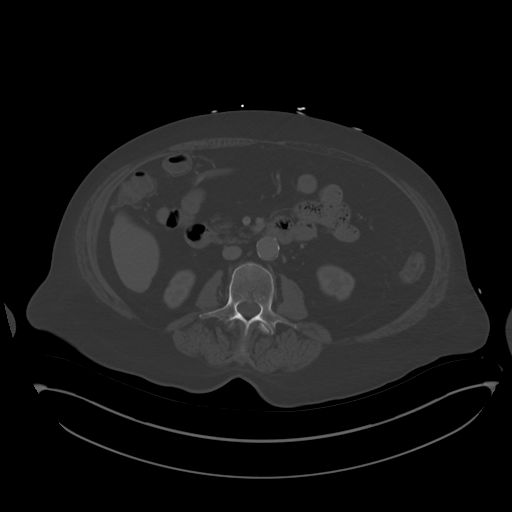
[im 69/100  soft-tissue]
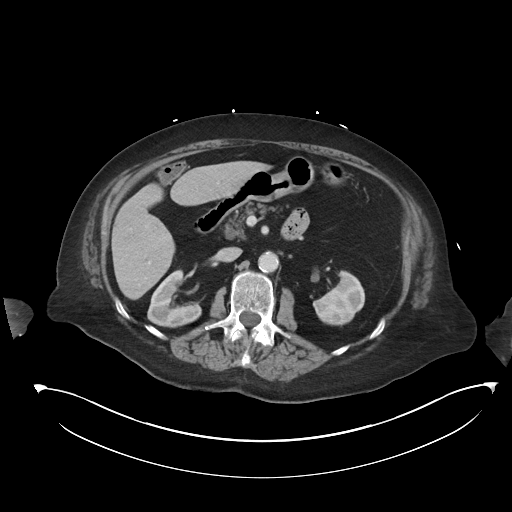
[im 81/100  soft-tissue]
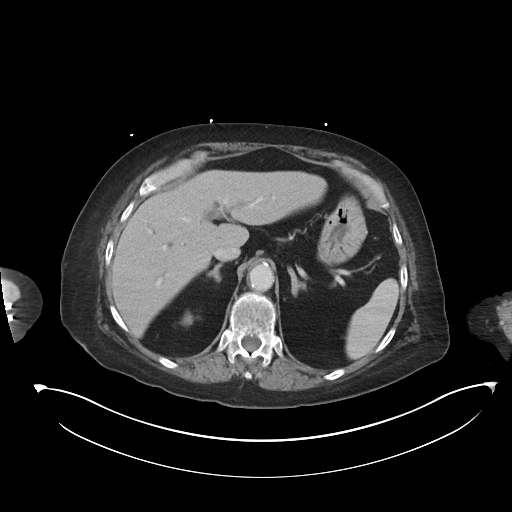
[im 87/100  soft-tissue]
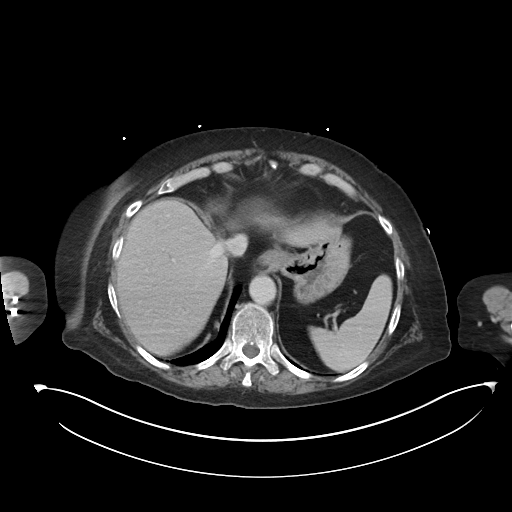
[im 93/100  soft-tissue]
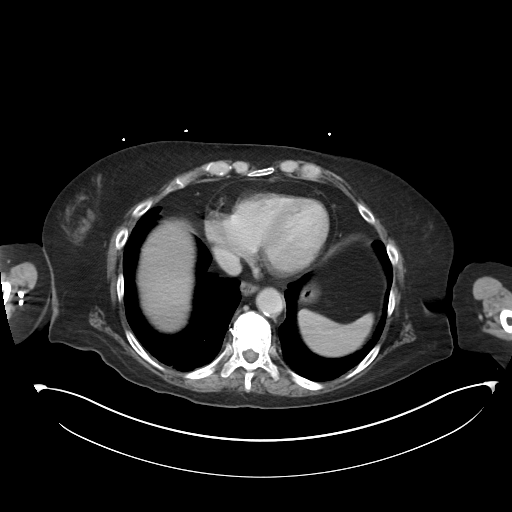

[Series 5: abdomen 3.0 mpr cor · coronal · 0.83mm/px · 3 of 98 slices shown]
[im 33/98  soft-tissue]
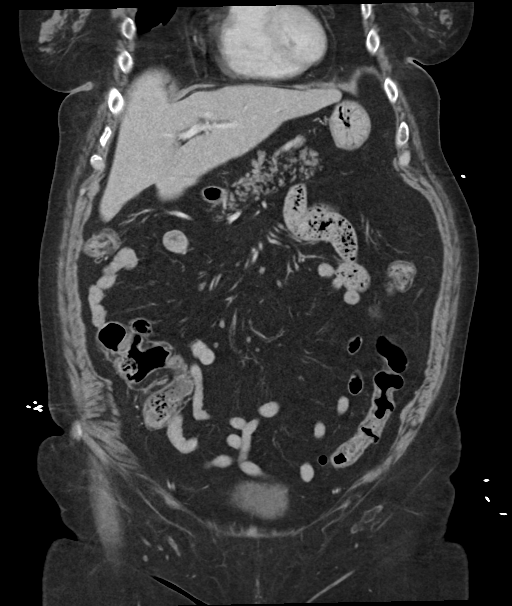
[im 44/98  soft-tissue]
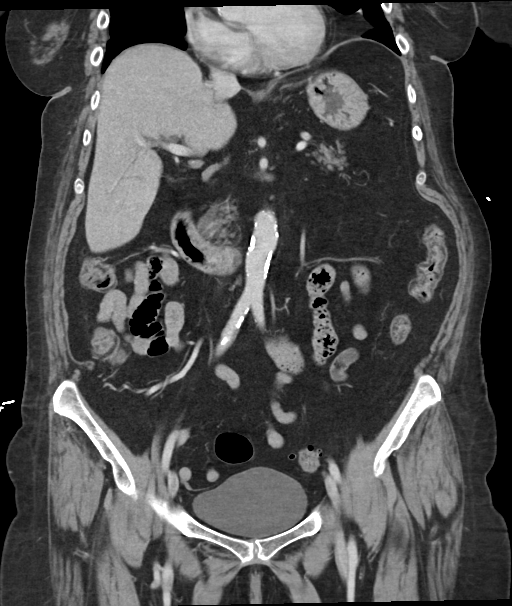
[im 54/98  soft-tissue]
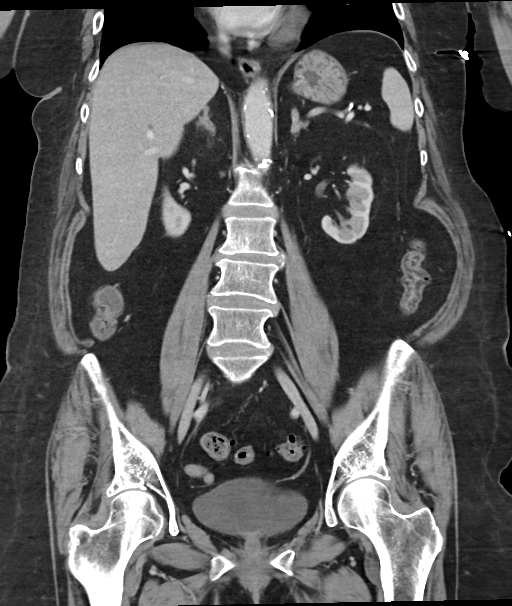

[16 of 46 positions shown; findings below may reference images not displayed]

RADIATION DOSE REDUCTION: This exam was performed according to the
departmental dose-optimization program which includes automated
exposure control, adjustment of the mA and/or kV according to
patient size and/or use of iterative reconstruction technique.

CONTRAST:  100mL OMNIPAQUE IOHEXOL 300 MG/ML  SOLN
FINDINGS: Lower chest: No acute abnormality.

Hepatobiliary: No focal liver abnormality is seen. Status post
cholecystectomy. Mild intrahepatic and extrahepatic biliary
dilatation is noted most likely due to post cholecystectomy status.

Pancreas: Unremarkable. No pancreatic ductal dilatation or
surrounding inflammatory changes.

Spleen: Normal in size without focal abnormality.

Adrenals/Urinary Tract: Adrenal glands are unremarkable. Kidneys are
normal, without renal calculi, focal lesion, or hydronephrosis.
Bladder is unremarkable.

Stomach/Bowel: The stomach appears normal. There is no evidence of
bowel obstruction or inflammation. Status post appendectomy. Sigmoid
diverticulosis without inflammation.

Vascular/Lymphatic: Aortic atherosclerosis. No enlarged abdominal or
pelvic lymph nodes.

Reproductive: Status post hysterectomy. No adnexal masses.

Other: No abdominal wall hernia or abnormality. No abdominopelvic
ascites.

Musculoskeletal: Status post T8-T9 kyphoplasty. No acute abnormality
seen.
IMPRESSION: Sigmoid diverticulosis without inflammation.

No acute abnormality seen in the abdomen or pelvis.

Aortic Atherosclerosis ([id number]).

## 2023-08-21 MED ORDER — HYDROCODONE-ACETAMINOPHEN 5-325 MG PO TABS: 1.0000 | ORAL_TABLET | Freq: Four times a day (QID) | ORAL | 0 refills | Status: DC | PRN

## 2023-08-21 NOTE — Telephone Encounter (Signed)
Spoke with pt. States that she needs this prescription sent to [facility]. Advised her that I would send this message over to [place]. Medication order as been pended with the correct pharmacy selected.

## 2023-08-21 NOTE — Telephone Encounter (Signed)
Pt is aware of Tabitha's message. Nothing further is needed.

## 2023-08-21 NOTE — Anesthesia Postprocedure Evaluation (Signed)
Anesthesia Post Note  Patient: Morgan [name]  Procedure(s) Performed: COLONOSCOPY WITH PROPOFOL POLYPECTOMY  Patient location during evaluation: PACU Anesthesia Type: General Level of consciousness: awake and alert Pain management: pain level controlled Vital Signs Assessment: post-procedure vital signs reviewed and stable Respiratory status: spontaneous breathing, nonlabored ventilation and respiratory function stable Cardiovascular status: blood pressure returned to baseline and stable Postop Assessment: no apparent nausea or vomiting Anesthetic complications: no   No notable events documented.   Last Vitals:  Vitals:   08/20/23 1326 08/20/23 1336  BP: 130/70 132/78  Pulse: (!) 56 (!) 57  Resp: 16 (!) 24  Temp:    SpO2: 100% 100%    Last Pain:  Vitals:   08/20/23 1326  TempSrc:   PainSc: 0-No pain                 Foye Deer

## 2023-08-21 NOTE — Telephone Encounter (Signed)
LM for pt to return call x 1 

## 2023-08-21 NOTE — Telephone Encounter (Signed)
Prescription Request  08/21/2023  LOV: 07/03/2023  What is the name of the medication or equipment? HYDROcodone-acetaminophen (NORCO/VICODIN) 5-325 MG tablet   Have you contacted your pharmacy to request a refill? Yes   Which pharmacy would you like this sent to?    [id number] - [place], [place] - [address] AT [facility] [address] [facility] [address] [place] [address] Phone: [phone] Fax: [phone]    Patient notified that their request is being sent to the clinical staff for review and that they should receive a response within 2 business days.   Please advise at Mobile [phone] (mobile)

## 2023-08-21 NOTE — Telephone Encounter (Signed)
Refilling medication.  Please advise pt to monitor tylenol intake as the hydrocodone has acetaminophen (tylenol in it) never go over 4 g daily (4000 mg)  (For my notes: PDMP reviewed and left rib fracture via ER report reviewed)

## 2023-08-21 NOTE — Addendum Note (Signed)
Addended by: Mort Sawyers on: 08/21/2023 12:56 PM   Modules accepted: Orders

## 2023-08-22 MED ORDER — HYDROCODONE-ACETAMINOPHEN 5-325 MG PO TABS
1.0000 | ORAL_TABLET | Freq: Four times a day (QID) | ORAL | 0 refills | Status: AC | PRN
Start: 2023-08-22 — End: 2023-08-27

## 2023-08-22 NOTE — Telephone Encounter (Signed)
Patient visited the office, asked if anything had been sent to [facility] for this medication. [facility]. Patient called and asked for [facility]. Patient came in today to get a physical rx to give to the pharmacy, advised that Wyatt Mage was gone for the day and we did not have a way to give her a physical rx. Advised patient I would send a message regarding this

## 2023-08-22 NOTE — Telephone Encounter (Signed)
[facility] and [facility] Norco script that has been sent to them.

## 2023-08-22 NOTE — Telephone Encounter (Signed)
Called patient let her know that provider will review and resend script as requested. Did not give patient time frame would be addressed.

## 2023-08-22 NOTE — Telephone Encounter (Signed)
Pt called stating [facility] didn't have rx yet from [facility]. Pt states she is in extreme pain & needs meds. Call back # [phone]

## 2023-08-23 ENCOUNTER — Telehealth: Payer: Self-pay

## 2023-08-23 NOTE — Telephone Encounter (Signed)
Transition Care Management Unsuccessful Follow-up Telephone Call  Date of discharge and from where:  08/12/2023 [facility]  Attempts:  1st Attempt  Reason for unsuccessful TCM follow-up call:  Left voice message  Hanaa Payes [facility]  [facility] Population Health Community Resource Care Guide   [email or web address]  ?? [phone]   Website: [email or web address]  [email or web address]

## 2023-08-26 ENCOUNTER — Telehealth: Payer: Self-pay

## 2023-08-26 DIAGNOSIS — I509 Heart failure, unspecified: Secondary | ICD-10-CM | POA: Diagnosis not present

## 2023-08-26 DIAGNOSIS — J449 Chronic obstructive pulmonary disease, unspecified: Secondary | ICD-10-CM | POA: Diagnosis not present

## 2023-08-26 NOTE — Progress Notes (Signed)
noted 

## 2023-08-26 NOTE — Telephone Encounter (Signed)
Transition Care Management Follow-up Telephone Call Date of discharge and from where: 08/12/2023 [facility] How have you been since you were released from the hospital? Patient stated she is still very sore and has fallen again thinks she may have broken another rib. PCP encouraged patient to use her can and walker. Any questions or concerns? No  Items Reviewed: Did the pt receive and understand the discharge instructions provided? Yes  Medications obtained and verified? Yes  Other? No  Any new allergies since your discharge? No  Dietary orders reviewed? Yes Do you have support at home? Yes   Follow up appointments reviewed:  PCP Hospital f/u appt confirmed? Yes  Scheduled to see Mort Sawyers, FNP on 08/21/2023 @ [facility] at [place]. Specialist Hospital f/u appt confirmed? No  Scheduled to see  on  @ . Are transportation arrangements needed? No  If their condition worsens, is the pt aware to call PCP or go to the Emergency Dept.? Yes Was the patient provided with contact information for the PCP's office or ED? Yes Was to pt encouraged to call back with questions or concerns? Yes  Amrit Cress [facility]  [facility] Population Health Community Resource Care Guide   [email or web address]  ?? [phone]   Website: [email or web address]  [email or web address]

## 2023-08-28 ENCOUNTER — Other Ambulatory Visit: Payer: Self-pay | Admitting: Family

## 2023-08-28 DIAGNOSIS — J449 Chronic obstructive pulmonary disease, unspecified: Secondary | ICD-10-CM | POA: Diagnosis not present

## 2023-08-28 DIAGNOSIS — I509 Heart failure, unspecified: Secondary | ICD-10-CM | POA: Diagnosis not present

## 2023-08-28 DIAGNOSIS — F4323 Adjustment disorder with mixed anxiety and depressed mood: Secondary | ICD-10-CM

## 2023-08-29 ENCOUNTER — Ambulatory Visit (INDEPENDENT_AMBULATORY_CARE_PROVIDER_SITE_OTHER): Payer: Medicare HMO | Admitting: Family

## 2023-08-29 ENCOUNTER — Other Ambulatory Visit: Payer: Self-pay | Admitting: Family

## 2023-08-29 ENCOUNTER — Encounter: Payer: Self-pay | Admitting: Family

## 2023-08-29 ENCOUNTER — Ambulatory Visit (INDEPENDENT_AMBULATORY_CARE_PROVIDER_SITE_OTHER)
Admission: RE | Admit: 2023-08-29 | Discharge: 2023-08-29 | Disposition: A | Discharge status: Home / Self Care | Payer: Medicare HMO | Source: Ambulatory Visit | Attending: Family | Admitting: Family

## 2023-08-29 VITALS — BP 128/76 | HR 67 | Temp 97.8°F | Ht 72.0 in | Wt 178.8 lb

## 2023-08-29 DIAGNOSIS — Z9181 History of falling: Secondary | ICD-10-CM | POA: Diagnosis not present

## 2023-08-29 DIAGNOSIS — S299XXA Unspecified injury of thorax, initial encounter: Secondary | ICD-10-CM

## 2023-08-29 DIAGNOSIS — R0781 Pleurodynia: Secondary | ICD-10-CM | POA: Diagnosis not present

## 2023-08-29 DIAGNOSIS — R296 Repeated falls: Secondary | ICD-10-CM

## 2023-08-29 DIAGNOSIS — S2241XA Multiple fractures of ribs, right side, initial encounter for closed fracture: Secondary | ICD-10-CM | POA: Diagnosis not present

## 2023-08-29 DIAGNOSIS — D520 Dietary folate deficiency anemia: Secondary | ICD-10-CM

## 2023-08-29 MED ORDER — HYDROCODONE-ACETAMINOPHEN 5-325 MG PO TABS: 1.0000 | ORAL_TABLET | Freq: Four times a day (QID) | ORAL | 0 refills | Status: DC | PRN

## 2023-08-29 NOTE — Progress Notes (Signed)
Established Patient Office Visit  Subjective:      CC:  Chief Complaint  Patient presents with   Follow-up    ED follow up - broken ribs    HPI: Morgan [name] is a 72 y.o. female presenting on 08/29/2023 for Follow-up (ED follow up - broken ribs) . 08/12/23 went to [facility]   CT head C spine and maxillofacial , negative for acute abn  Xray right and left knee left hip negative for acute abn  Left rib xray with 1/9 rib fracture.  Xray pelvis neg for acute findings. Left hip xray neg.  This was due a fall, with c/o multiple falls prior. Tripped and fell and lost her balance. Skin tear to left arm that she states is now healing.  Discharged same day from ER.   She states a few days later again fell at home and hit the floor and fell on her face and also on her right side, she thinks maybe she also fractured her right side in the rib area because the pain is every time she breaths, she is using an ice pack and taking pain medication , last dose today. She denies sob but it does hurt to breath due to the pain. Using a pillow to breathe without pain, cough also exacerbating.   She did land outstretched on both wrists so they are slightly tender but with active ROM no limitations.   She is using her cane now regularly.   New complaints: She does see neurologist for her parkinsons. He is not yet aware of multiple falls per her record but she will d/w him at her next visit x one month.   She is worried for her grandson, recently re homed because he was being violent at home, with his other aunt and he is doing well. She was just used to him being at home constantly.      Social history:  Relevant past medical, surgical, family and social history reviewed and updated as indicated. Interim medical history since our last visit reviewed.  Allergies and medications reviewed and updated.  DATA REVIEWED: CHART IN EPIC     ROS: Negative unless specifically indicated above in HPI.     Current Outpatient Medications:    acetaminophen (TYLENOL) 325 MG tablet, Take 2 tablets (650 mg total) by mouth every 4 (four) hours as needed for mild pain (or temp > 37.5 C (99.5 F))., Disp: , Rfl:    albuterol (VENTOLIN HFA) 108 (90 Base) MCG/ACT inhaler, Inhale 2 puffs into the lungs every 6 (six) hours as needed for wheezing or shortness of breath., Disp: 1 each, Rfl: 11   Budeson-Glycopyrrol-Formoterol (BREZTRI AEROSPHERE) 160-9-4.8 MCG/ACT AERO, Inhale 2 puffs into the lungs in the morning and at bedtime., Disp: 1 each, Rfl: 11   carbidopa-levodopa (SINEMET IR) 25-100 MG tablet, Take 2 tablets by mouth 3 (three) times daily., Disp: , Rfl:    cyanocobalamin (VITAMIN B12) 100 MCG tablet, Take 100 mcg by mouth daily., Disp: , Rfl:    EPINEPHrine 0.3 mg/0.3 mL IJ SOAJ injection, Inject 0.3 mg into the muscle as needed for anaphylaxis., Disp: 1 each, Rfl: 1   folic acid (FOLVITE) 1 MG tablet, TAKE 1 TABLET BY MOUTH EVERY DAY, Disp: 90 tablet, Rfl: 1   HYDROcodone-acetaminophen (NORCO) 5-325 MG tablet, Take 1 tablet by mouth every 6 (six) hours as needed for moderate pain., Disp: 30 tablet, Rfl: 0   hydrOXYzine (VISTARIL) 50 MG capsule, Take 1 capsule (50 mg  total) by mouth 3 (three) times daily as needed., Disp: 30 capsule, Rfl: 0   ibuprofen (ADVIL) 400 MG tablet, Take 1 tablet (400 mg total) by mouth every 8 (eight) hours as needed for headache or moderate pain., Disp: 20 tablet, Rfl: 0   KLOR-CON M20 20 MEQ tablet, TAKE 1 TABLET BY MOUTH EVERY DAY, Disp: 90 tablet, Rfl: 1   OXYGEN, Inhale 3 L/min into the lungs See admin instructions. 3 L/min at bedtime and as needed for shortness of breath throughout the day, Disp: , Rfl:    pantoprazole (PROTONIX) 20 MG tablet, Take by mouth., Disp: , Rfl:    promethazine (PHENERGAN) 12.5 MG tablet, Take 1 tablet (12.5 mg total) by mouth every 12 (twelve) hours as needed for nausea or vomiting., Disp: 20 tablet, Rfl: 0   topiramate (TOPAMAX) 50 MG  tablet, TAKE 1 TABLET BY MOUTH EVERYDAY AT BEDTIME, Disp: 90 tablet, Rfl: 0      Objective:    BP 128/76 (BP Location: Left Arm, Patient Position: Sitting, Cuff Size: Normal)   Pulse 67   Temp 97.8 F (36.6 C) (Temporal)   Ht 6' (1.829 m)   Wt 178 lb 12.8 oz (81.1 kg)   SpO2 98%   BMI 24.25 kg/m   Wt Readings from Last 3 Encounters:  08/29/23 178 lb 12.8 oz (81.1 kg)  08/20/23 175 lb 2.5 oz (79.5 kg)  08/12/23 185 lb (83.9 kg)    Physical Exam Constitutional:      General: She is not in acute distress.    Appearance: Normal appearance. She is normal weight. She is not ill-appearing, toxic-appearing or diaphoretic.  HENT:     Head: Normocephalic.  Cardiovascular:     Rate and Rhythm: Normal rate and regular rhythm.  Pulmonary:     Effort: Pulmonary effort is normal.     Breath sounds: Normal breath sounds.  Musculoskeletal:        General: Tenderness present. Normal range of motion.  Neurological:     General: No focal deficit present.     Mental Status: She is alert and oriented to person, place, and time. Mental status is at baseline.  Psychiatric:        Mood and Affect: Mood normal.        Behavior: Behavior normal.        Thought Content: Thought content normal.        Judgment: Judgment normal.     Pain with respiration  Tenderness palpated to bil inner sternum  Limited expansion of chest with respirations       Assessment & Plan:  Rib injury Assessment & Plan: With left fracture suspect right sprain vs fracture Stat xray right side and chest Pt stable currently but in uncontrolled pain Pdmp reviewed. Refilled norco advised pt of potential side effects Pillow when coughing, sneezing, laughing or ambulating  Orders: -     DG Ribs Unilateral W/Chest Right; Future -     HYDROcodone-Acetaminophen; Take 1 tablet by mouth every 6 (six) hours as needed for moderate pain.  Dispense: 30 tablet; Refill: 0  Multiple falls Assessment & Plan: Unsteady gait,  advised strict use of cane and or walker when ambulating consider physical therapy Suspect parkinsons related Advised for non slip changes to home and always wear non slip footwear, non slip rugs, well lit living area  Advised pt to f/u with neurology    At high risk for falls     Return in about 1 month (around  09/28/2023) for f/u pain .  Mort Sawyers, MSN, APRN, FNP-C [facility] Medicine

## 2023-08-29 NOTE — Progress Notes (Signed)
Suspected fractures of 8th and 9th rib on the right side as well since most recent fall. If sudden sob and or difficulty breathing call 911 otherwise pain/symptom management with medication and pillow.   Pt overdue for lung doctor visit and there is some more thickening around the lung on the chest xray today. Please have her schedule visit with them.

## 2023-08-30 ENCOUNTER — Telehealth: Payer: Self-pay | Admitting: Family

## 2023-08-30 DIAGNOSIS — S299XXA Unspecified injury of thorax, initial encounter: Secondary | ICD-10-CM | POA: Insufficient documentation

## 2023-08-30 DIAGNOSIS — R296 Repeated falls: Secondary | ICD-10-CM | POA: Insufficient documentation

## 2023-08-30 DIAGNOSIS — R42 Dizziness and giddiness: Secondary | ICD-10-CM | POA: Insufficient documentation

## 2023-08-30 DIAGNOSIS — Z9181 History of falling: Secondary | ICD-10-CM | POA: Insufficient documentation

## 2023-08-30 NOTE — Telephone Encounter (Signed)
Mort Sawyers, FNP  P Dugal Pool Please advise pt to get an incentive spirometer can get one offline on [place]. Use daily to reduced risk of pneumonia development with lack of lung movement with rib pain. -------------------------------------------- LM for pt to return call x1.

## 2023-08-30 NOTE — Telephone Encounter (Signed)
Patient called in returning call she received. Relayed message below and patient stated understanding.

## 2023-08-30 NOTE — Assessment & Plan Note (Signed)
With left fracture suspect right sprain vs fracture Stat xray right side and chest Pt stable currently but in uncontrolled pain Pdmp reviewed. Refilled norco advised pt of potential side effects Pillow when coughing, sneezing, laughing or ambulating

## 2023-08-30 NOTE — Assessment & Plan Note (Signed)
Unsteady gait, advised strict use of cane and or walker when ambulating consider physical therapy Suspect parkinsons related Advised for non slip changes to home and always wear non slip footwear, non slip rugs, well lit living area  Advised pt to f/u with neurology

## 2023-09-09 ENCOUNTER — Telehealth: Payer: Self-pay | Admitting: Family

## 2023-09-09 NOTE — Telephone Encounter (Signed)
Prescription Request  09/09/2023  LOV: 08/29/2023  What is the name of the medication or equipment?   Patient is aware that this was taken off med list, asked for a refill and says she has been taking this medication   Have you contacted your pharmacy to request a refill? No   Which pharmacy would you like this sent to?  [id number] [place] [facility], [place] - [address] [place] [address] Phone: [phone] Fax: [phone]    Patient notified that their request is being sent to the clinical staff for review and that they should receive a response within 2 business days.   Please advise at Mobile [phone] (mobile)

## 2023-09-10 MED ORDER — SERTRALINE HCL 100 MG PO TABS: 100.0000 mg | ORAL_TABLET | Freq: Every day | ORAL | 3 refills | Status: DC

## 2023-09-10 NOTE — Progress Notes (Signed)
noted 

## 2023-09-18 ENCOUNTER — Ambulatory Visit (INDEPENDENT_AMBULATORY_CARE_PROVIDER_SITE_OTHER): Payer: Medicare HMO | Admitting: Pulmonary Disease

## 2023-09-18 ENCOUNTER — Telehealth: Payer: Self-pay | Admitting: Family

## 2023-09-18 ENCOUNTER — Encounter: Payer: Self-pay | Admitting: Pulmonary Disease

## 2023-09-18 VITALS — BP 130/90 | HR 82 | Ht 73.0 in | Wt 176.2 lb

## 2023-09-18 DIAGNOSIS — R0609 Other forms of dyspnea: Secondary | ICD-10-CM

## 2023-09-18 MED ORDER — PREDNISONE 20 MG PO TABS: ORAL_TABLET | ORAL | 0 refills | Status: AC

## 2023-09-18 NOTE — Patient Instructions (Signed)
Nice to see you again  Continue Breztri 2 puffs twice a day rinse your mouth out after every use  Take prednisone 40 mg for 5 days then 20 mg for 5 days then stop, see if this helps with some of the shortness of breath  Someone should be contacting him in the coming days to schedule your CT scan at [facility].  Return to clinic based on results of scan, we will decide at what time we should follow-up

## 2023-09-18 NOTE — Telephone Encounter (Signed)
[place] from [place] [employer] services contacted the office in regards to patient's visit on 9/12, states that it was reported that patient broke some ribs. Caller states that medicare recommends a bone density scan within 6 months of a fracture if the patient does not have one on file within the past 2 years. Caller wanted to know if the order could be placed for the patient, and if provider was on board with this. I asked if the patient was aware of this and agreeable, caller says they have not contacted pt yet regarding bone density scan, they prefer to contact provider first to make sure they are agreeable. Advised that I would send message to [place] and someone would get in contact with a response, [place] can be reached directly at [phone].

## 2023-09-18 NOTE — Progress Notes (Signed)
@Patient  ID: Morgan [name], female    DOB: 1951/08/19, 72 y.o.   MRN: [id number]  Chief Complaint  Patient presents with   Follow-up    Pt is here for Mass in Rt Lung.    Referring provider: Mort Sawyers, FNP  HPI:   72 y.o. woman whom we are seeing in follow up for evaluation of chronic cough/bronchitis.  Multiple recent PCP notes reviewed.  Most recent ED note reviewed.  Returns for routine follow-up.  On Breztri.  Coughing is improved.  Gradually more dyspneic.  Worse after fall and broken rib.  Pain with breathing so not taking deep breaths.  But was worsening prior to this.  Previous reticular markings noted on chest x-ray and prompted CT high-resolution to be performed 07/2023.  It appears this was never scheduled.  Reviewed recent chest x-ray 08/2023 with similar interstitial markings, otherwise clear, no concern for mass or infiltrate.  It seems like it was communicated to her from her PCP office there is a mass on her chest but there is none seen on the chest x-ray.  We discussed obtaining CT high-resolution as previously planned.  HPI at initial visit: Patient reports onset of symptoms about 2 months ago.  Started with fever, runny nose, sore throat.  The symptoms resolved but cough persisted.  Productive.  Initially yellow to clear phlegm.  Worsened over the last months to more green phlegm.  Associate with worsening dyspnea on exertion.  No times a day with things are better or worse.  No position to make things better or worse.  She thinks the pollens have made nasal congestion and some cough worse given chronic issues with pollens and nasal/sinus congestion with seasonal changes in the past.  Has been using Advair without much improvement.  Was given a course of prednisone and Augmentin about 1 month ago.  This did help for period of time but symptoms have recurred and back to as bad as they were prior.  Was placed on prednisone and levofloxacin yesterday via PCP.  Review chest  x-ray 03/14/2022 that on my review and interpretation reveals hyperinflation with what appears to be chronic bronchial scarring/inflammation.  Review chest x-ray 04/12/2022 that on my interpretation is unchanged from earlier x-ray in March.  PMH: Tobacco abuse in remission, seasonal allergies, hypertension Surgical history: Hysterectomy, appendectomy, esophageal dilation, knee surgery, cholecystectomy Family history: Mother with history of breast cancer, ovarian cancer, father with hypertension, hyperlipidemia, CAD, colon cancer in 70s Social history: Former smoker, Health and safety inspector, quit 2015, retired Financial trader / Pulmonary Flowsheets:   ACT:      No data to display          MMRC:     No data to display          Epworth:      No data to display          Tests:   FENO:  No results found for: "NITRICOXIDE"  PFT:    Latest Ref Rng & Units 07/16/2022    8:57 AM  PFT Results  FVC-Pre L 2.07   FVC-Predicted Pre % 52   FVC-Post L 2.24   FVC-Predicted Post % 56   Pre FEV1/FVC % % 82   Post FEV1/FCV % % 82   FEV1-Pre L 1.69   FEV1-Predicted Pre % 56   FEV1-Post L 1.84   DLCO uncorrected ml/min/mmHg 13.18   DLCO UNC% % 53   DLCO corrected ml/min/mmHg 14.95   DLCO COR %Predicted %  61   DLVA Predicted % 98   TLC L 4.66   TLC % Predicted % 76   RV % Predicted % 92   Personally reviewed interpreted as spirometry just above moderate restriction versus air trapping.  No bronchodilator spots.  TLC confirms mild restriction.  DLCO moderately reduced.  WALK:      No data to display          Imaging: Personally reviewed and as per EMR discussion this note DG Ribs Unilateral W/Chest Right  Result Date: 08/29/2023 CLINICAL DATA:  Injury and right rib pain EXAM: RIGHT RIBS AND CHEST - 3+ VIEW COMPARISON:  08/12/2023 FINDINGS: Equivocal for anterior right eighth and ninth rib fractures. There is a regional symptom marker. No hemothorax or pneumothorax. Biapical  pleural based thickening is symmetric. Normal heart size and mediastinal contours. Lower thoracic cement augmentation. IMPRESSION: 1. Possible, nondisplaced right eighth and ninth anterior rib fractures. 2. No pneumothorax. Electronically Signed   By: Tiburcio Pea M.D.   On: 08/29/2023 12:33    Lab Results: Personally reviewed CBC    Component Value Date/Time   WBC 6.3 08/12/2023 1339   RBC 3.74 (L) 08/12/2023 1339   HGB 10.5 (L) 08/12/2023 1339   HGB 12.4 09/18/2012 2000   HCT 32.8 (L) 08/12/2023 1339   HCT 37.3 09/18/2012 2000   PLT 212 08/12/2023 1339   PLT 450 (H) 09/18/2012 2000   MCV 87.7 08/12/2023 1339   MCV 86 09/18/2012 2000   MCH 28.1 08/12/2023 1339   MCHC 32.0 08/12/2023 1339   RDW 14.1 08/12/2023 1339   RDW 14.5 09/18/2012 2000   LYMPHSABS 2.2 08/12/2023 1339   MONOABS 0.3 08/12/2023 1339   EOSABS 0.2 08/12/2023 1339   BASOSABS 0.0 08/12/2023 1339    BMET    Component Value Date/Time   NA 138 08/12/2023 1339   NA 143 12/26/2018 0000   NA 139 09/18/2012 2000   K 4.1 08/12/2023 1339   K 3.3 (L) 09/18/2012 2000   CL 105 08/12/2023 1339   CL 99 09/18/2012 2000   CO2 25 08/12/2023 1339   CO2 33 (H) 09/18/2012 2000   GLUCOSE 97 08/12/2023 1339   GLUCOSE 111 (H) 09/18/2012 2000   BUN 27 (H) 08/12/2023 1339   BUN 16 12/26/2018 0000   BUN 13 09/18/2012 2000   CREATININE 1.10 (H) 08/12/2023 1339   CREATININE 0.72 09/18/2012 2000   CALCIUM 8.8 (L) 08/12/2023 1339   CALCIUM 9.4 09/18/2012 2000   GFRNONAA 54 (L) 08/12/2023 1339   GFRNONAA >60 09/18/2012 2000   GFRAA >60 09/02/2020 1658   GFRAA >60 09/18/2012 2000    BNP    Component Value Date/Time   BNP 77.7 08/08/2022 1113    ProBNP    Component Value Date/Time   PROBNP 180.0 (H) 08/29/2021 0931    Specialty Problems       Pulmonary Problems   COPD (chronic obstructive pulmonary disease) (HCC)    Formatting of this note might be different from the original. Last Assessment & Plan:   Formatting of this note might be different from the original. Good air movement, no wheezes. Endorses sob with activity and fatigue but low suspicion for COPD exacerbation. Suspect is related to pain and post-hospital fatigue.      Oropharyngeal dysphagia   DOE (dyspnea on exertion)    Allergies  Allergen Reactions   Angiotensin Receptor Blockers Swelling    Angioedema   Meperidine Hives and Anaphylaxis   Strawberry Extract Hives  and Swelling   Sucralfate Nausea Only and Other (See Comments)    Severe nausea Other reaction(s): Other (See Comments) Severe nausea Severe nausea   Wasp Venom Hives    Immunization History  Administered Date(s) Administered   Fluad Quad(high Dose 65+) 09/30/2019, 08/29/2020, 08/28/2021   Influenza, High Dose Seasonal PF 08/23/2018   Influenza,inj,Quad PF,6+ Mos 02/24/2015   Moderna Sars-Covid-2 Vaccination 03/18/2020, 04/15/2020   Pneumococcal Conjugate-13 11/12/2018   Pneumococcal Polysaccharide-23 02/24/2015    Past Medical History:  Diagnosis Date   Allergy    Anemia    Anxiety    Arthritis    CHF (congestive heart failure) (HCC)    Chronic kidney disease    COPD (chronic obstructive pulmonary disease) (HCC)    Depression    GERD (gastroesophageal reflux disease)    Headache    Hyperlipidemia    Hypertension    MVP (mitral valve prolapse)    Parkinson disease (HCC)    Vascular dementia (HCC)     Tobacco History: Social History   Tobacco Use  Smoking Status Former   Current packs/day: 0.00   Average packs/day: 1 pack/day for 15.0 years (15.0 ttl pk-yrs)   Types: Cigarettes   Start date: 09/12/2002   Quit date: 09/12/2017   Years since quitting: 6.0  Smokeless Tobacco Never   Counseling given: Not Answered   Continue to not smoke  Outpatient Encounter Medications as of 09/18/2023  Medication Sig   acetaminophen (TYLENOL) 325 MG tablet Take 2 tablets (650 mg total) by mouth every 4 (four) hours as needed for mild pain  (or temp > 37.5 C (99.5 F)).   albuterol (VENTOLIN HFA) 108 (90 Base) MCG/ACT inhaler Inhale 2 puffs into the lungs every 6 (six) hours as needed for wheezing or shortness of breath.   Budeson-Glycopyrrol-Formoterol (BREZTRI AEROSPHERE) 160-9-4.8 MCG/ACT AERO Inhale 2 puffs into the lungs in the morning and at bedtime.   carbidopa-levodopa (SINEMET IR) 25-100 MG tablet Take 2 tablets by mouth 3 (three) times daily.   cyanocobalamin (VITAMIN B12) 100 MCG tablet Take 100 mcg by mouth daily.   EPINEPHrine 0.3 mg/0.3 mL IJ SOAJ injection Inject 0.3 mg into the muscle as needed for anaphylaxis.   folic acid (FOLVITE) 1 MG tablet TAKE 1 TABLET BY MOUTH EVERY DAY   HYDROcodone-acetaminophen (NORCO) 5-325 MG tablet Take 1 tablet by mouth every 6 (six) hours as needed for moderate pain.   hydrOXYzine (VISTARIL) 50 MG capsule Take 1 capsule (50 mg total) by mouth 3 (three) times daily as needed.   ibuprofen (ADVIL) 400 MG tablet Take 1 tablet (400 mg total) by mouth every 8 (eight) hours as needed for headache or moderate pain.   KLOR-CON M20 20 MEQ tablet TAKE 1 TABLET BY MOUTH EVERY DAY   OXYGEN Inhale 3 L/min into the lungs See admin instructions. 3 L/min at bedtime and as needed for shortness of breath throughout the day   pantoprazole (PROTONIX) 20 MG tablet Take by mouth.   predniSONE (DELTASONE) 20 MG tablet Take 2 tablets (40 mg total) by mouth daily with breakfast for 5 days, THEN 1 tablet (20 mg total) daily with breakfast for 5 days.   promethazine (PHENERGAN) 12.5 MG tablet Take 1 tablet (12.5 mg total) by mouth every 12 (twelve) hours as needed for nausea or vomiting.   sertraline (ZOLOFT) 100 MG tablet Take 1 tablet (100 mg total) by mouth daily.   topiramate (TOPAMAX) 50 MG tablet TAKE 1 TABLET BY MOUTH EVERYDAY AT BEDTIME  No facility-administered encounter medications on file as of 09/18/2023.     Review of Systems  Review of Systems  N/a Physical Exam  BP (!) 130/90 (BP Location:  Left Arm, Cuff Size: Normal)   Pulse 82   Ht 6\' 1"  (1.854 m)   Wt 176 lb 3.2 oz (79.9 kg)   SpO2 97%   BMI 23.25 kg/m   Wt Readings from Last 5 Encounters:  09/18/23 176 lb 3.2 oz (79.9 kg)  08/29/23 178 lb 12.8 oz (81.1 kg)  08/20/23 175 lb 2.5 oz (79.5 kg)  08/12/23 185 lb (83.9 kg)  07/03/23 182 lb 8 oz (82.8 kg)    BMI Readings from Last 5 Encounters:  09/18/23 23.25 kg/m  08/29/23 24.25 kg/m  08/20/23 23.11 kg/m  08/12/23 24.41 kg/m  07/03/23 24.75 kg/m     Physical Exam General: Sitting in chair, no acute distress Eyes: EOMI, icterus Neck: Supple, no JVP Pulmonary: Distant, clear Abdomen: Nondistended, bowel sounds present CV: Regular rate and rhythm, no murmur MSK: No synovitis, joint effusion Neuro: Normal gait, no weakness Psych: Normal mood, full affect   Assessment & Plan:    Dyspnea on exertion: Overall stable.  Now back on Breztri and albuterol.  PFTs without fixed obstruction or bronchodilator response.  Did show some signs of restriction with reduced DLCO.  High-res CT scan 07/2022 reveals mild upper lobe interlobular septal thickening, otherwise clear.  Pattern not clear but not consistent with UIP.  NSIP versus HP versus postinfectious scarring versus biapical scarring as result of prior smoking history.  High-resolution CT scan 07/2023 not performed, sent message to schedule today.  Prednisone taper today.  Continue Breztri.  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  Encouraged her to check her oxygen saturation to maintain 88%.  Notably no clear reason on recent high-res CT scan of the chest for hypoxemia in regards to lung parenchyma.  Mild interstitial findings in the upper lobes do not account for this.    Return for f/u Dr. Judeth Horn.  Timing based on findings of CT scan   Karren Burly, MD 09/18/2023

## 2023-09-19 ENCOUNTER — Other Ambulatory Visit: Payer: Self-pay | Admitting: Family

## 2023-09-19 DIAGNOSIS — Z780 Asymptomatic menopausal state: Secondary | ICD-10-CM

## 2023-09-19 DIAGNOSIS — F411 Generalized anxiety disorder: Secondary | ICD-10-CM

## 2023-09-19 DIAGNOSIS — S2232XA Fracture of one rib, left side, initial encounter for closed fracture: Secondary | ICD-10-CM

## 2023-09-19 NOTE — Telephone Encounter (Signed)
LM for pt to returncall

## 2023-09-19 NOTE — Telephone Encounter (Signed)
Spoke with pt. She is aware of this information and is agreeable to having this scan done. Information to make this appointment has been relayed to her. Nothing further was needed at this time.

## 2023-09-19 NOTE — Telephone Encounter (Signed)
Bone density ordered. Please advise pt  Have her  Please call [phone] to schedule this appointment.

## 2023-09-20 ENCOUNTER — Ambulatory Visit
Admission: RE | Admit: 2023-09-20 | Discharge: 2023-09-20 | Disposition: A | Discharge status: Home / Self Care | Payer: Medicare HMO | Source: Ambulatory Visit | Attending: Pulmonary Disease | Admitting: Pulmonary Disease

## 2023-09-20 DIAGNOSIS — R918 Other nonspecific abnormal finding of lung field: Secondary | ICD-10-CM | POA: Diagnosis not present

## 2023-09-20 DIAGNOSIS — I7121 Aneurysm of the ascending aorta, without rupture: Secondary | ICD-10-CM | POA: Diagnosis not present

## 2023-09-20 DIAGNOSIS — J849 Interstitial pulmonary disease, unspecified: Secondary | ICD-10-CM | POA: Insufficient documentation

## 2023-09-20 DIAGNOSIS — I700 Atherosclerosis of aorta: Secondary | ICD-10-CM | POA: Diagnosis not present

## 2023-09-25 ENCOUNTER — Other Ambulatory Visit: Payer: Self-pay | Admitting: Family

## 2023-09-25 DIAGNOSIS — I509 Heart failure, unspecified: Secondary | ICD-10-CM | POA: Diagnosis not present

## 2023-09-25 DIAGNOSIS — F411 Generalized anxiety disorder: Secondary | ICD-10-CM

## 2023-09-25 DIAGNOSIS — J449 Chronic obstructive pulmonary disease, unspecified: Secondary | ICD-10-CM | POA: Diagnosis not present

## 2023-09-25 DIAGNOSIS — F331 Major depressive disorder, recurrent, moderate: Secondary | ICD-10-CM

## 2023-09-25 MED ORDER — HYDROXYZINE PAMOATE 50 MG PO CAPS
50.0000 mg | ORAL_CAPSULE | Freq: Three times a day (TID) | ORAL | 0 refills | Status: DC | PRN
Start: 2023-09-25 — End: 2024-02-03

## 2023-09-25 NOTE — Telephone Encounter (Signed)
Prescription Request  09/25/2023  LOV: 08/29/2023  What is the name of the medication or equipment? hydrOXYzine (VISTARIL) 50 MG capsule   Have you contacted your pharmacy to request a refill? No   Which pharmacy would you like this sent to?  [id number], [place] - [address] [place] [address] Phone: [phone] Fax: [phone]   Patient notified that their request is being sent to the clinical staff for review and that they should receive a response within 2 business days.   Please advise at Mobile [phone] (mobile)  Patient is out of this medication

## 2023-09-25 NOTE — Telephone Encounter (Signed)
Last OV: 08/29/2023 Pending OV: Nothing scheduled at this time Medication Hydroxyzine 25mg  Directions: 1 tablet TID prn Last Refill: 07/16/2023 Qty: #30 with 0 refills

## 2023-09-27 DIAGNOSIS — J449 Chronic obstructive pulmonary disease, unspecified: Secondary | ICD-10-CM | POA: Diagnosis not present

## 2023-09-27 DIAGNOSIS — I509 Heart failure, unspecified: Secondary | ICD-10-CM | POA: Diagnosis not present

## 2023-10-13 ENCOUNTER — Ambulatory Visit
Admission: EM | Admit: 2023-10-13 | Discharge: 2023-10-13 | Disposition: A | Discharge status: Home / Self Care | Payer: Medicare HMO | Attending: Physician Assistant | Admitting: Physician Assistant

## 2023-10-13 DIAGNOSIS — J441 Chronic obstructive pulmonary disease with (acute) exacerbation: Secondary | ICD-10-CM

## 2023-10-13 DIAGNOSIS — Z1152 Encounter for screening for COVID-19: Secondary | ICD-10-CM | POA: Diagnosis not present

## 2023-10-13 MED ORDER — PREDNISONE 20 MG PO TABS: 40.0000 mg | ORAL_TABLET | Freq: Every day | ORAL | 0 refills | Status: AC

## 2023-10-13 MED ORDER — AMOXICILLIN-POT CLAVULANATE 875-125 MG PO TABS: 1.0000 | ORAL_TABLET | Freq: Two times a day (BID) | ORAL | 0 refills | Status: DC

## 2023-10-13 NOTE — ED Triage Notes (Signed)
"  Started with sore throat on Friday with losing my voice, then my sinus's started hurting really bad, with headache and cough now". No fever.

## 2023-10-14 LAB — SARS CORONAVIRUS 2 (TAT 6-24 HRS): SARS Coronavirus 2: NEGATIVE

## 2023-10-15 ENCOUNTER — Ambulatory Visit (INDEPENDENT_AMBULATORY_CARE_PROVIDER_SITE_OTHER): Payer: Medicare HMO

## 2023-10-15 VITALS — Ht 73.0 in | Wt 175.0 lb

## 2023-10-15 DIAGNOSIS — Z0000 Encounter for general adult medical examination without abnormal findings: Secondary | ICD-10-CM | POA: Diagnosis not present

## 2023-10-15 NOTE — Patient Instructions (Signed)
Morgan [name] , Thank you for taking time to come for your Medicare Wellness Visit. I appreciate your ongoing commitment to your health goals. Please review the following plan we discussed and let me know if I can assist you in the future.   Referrals/Orders/Follow-Ups/Clinician Recommendations: Aim for 30 minutes of exercise or brisk walking, 6-8 glasses of water, and 5 servings of fruits and vegetables each day.   This is a list of the screening recommended for you and due dates:  Health Maintenance  Topic Date Due   Zoster (Shingles) Vaccine (1 of 2) Never done   Pneumonia Vaccine (3 of 3 - PPSV23 or PCV20) 02/24/2020   Flu Shot  07/18/2023   Mammogram  09/01/2023   COVID-19 Vaccine (3 - Moderna risk series) 04/03/2024*   Medicare Annual Wellness Visit  10/14/2024   Colon Cancer Screening  08/19/2033   DEXA scan (bone density measurement)  Completed   Hepatitis C Screening  Completed   HPV Vaccine  Aged Out   DTaP/Tdap/Td vaccine  Discontinued  *Topic was postponed. The date shown is not the original due date.    Advanced directives: (Provided) Advance directive discussed with you today. I have provided a copy for you to complete at home and have notarized. Once this is complete, please bring a copy in to our office so we can scan it into your chart. Information on Advanced Care Planning can be found at [facility] Advance Health Care Directives Advance Health Care Directives ([email or web address]    Next Medicare Annual Wellness Visit scheduled for next year: Yes  Insert Preventive Care attachment Insert FALL PREVENTION attachment if needed

## 2023-10-15 NOTE — Progress Notes (Signed)
Subjective:   Morgan [name] is a 72 y.o. female who presents for Medicare Annual (Subsequent) preventive examination.  Visit Complete: Virtual I connected with  Morgan [name] on 10/15/23 by a audio enabled telemedicine application and verified that I am speaking with the correct person using two identifiers.  Patient Location: Home  Provider Location: Home Office  I discussed the limitations of evaluation and management by telemedicine. The patient expressed understanding and agreed to proceed.  Vital Signs: Because this visit was a virtual/telehealth visit, some criteria may be missing or patient reported. Any vitals not documented were not able to be obtained and vitals that have been documented are patient reported.  Patient Medicare AWV questionnaire was completed by the patient on 10/15/2023; I have confirmed that all information answered by patient is correct and no changes since this date.  Cardiac Risk Factors include: advanced age (>41men, >41 women);dyslipidemia;hypertension     Objective:    Today's Vitals   10/15/23 0802  Weight: 175 lb (79.4 kg)  Height: 6\' 1"  (1.854 m)   Body mass index is 23.09 kg/m.     10/15/2023    8:06 AM 08/20/2023   11:41 AM 08/12/2023   11:37 AM 06/21/2023   11:00 AM 01/01/2023    1:00 PM 12/26/2022    1:08 PM 10/11/2022    8:27 AM  Advanced Directives  Does Patient Have a Medical Advance Directive? No No No No No No No  Type of Advance Directive      Healthcare Power of Attorney   Does patient want to make changes to medical advance directive?     No - Patient declined    Copy of Healthcare Power of Attorney in Chart?      No - copy requested   Would patient like information on creating a medical advance directive? Yes (MAU/Ambulatory/Procedural Areas - Information given)   No - Patient declined No - Patient declined Yes (Inpatient - patient requests chaplain consult to create a medical advance directive) No - Patient declined     Current Medications (verified) Outpatient Encounter Medications as of 10/15/2023  Medication Sig   acetaminophen (TYLENOL) 325 MG tablet Take 2 tablets (650 mg total) by mouth every 4 (four) hours as needed for mild pain (or temp > 37.5 C (99.5 F)).   albuterol (VENTOLIN HFA) 108 (90 Base) MCG/ACT inhaler Inhale 2 puffs into the lungs every 6 (six) hours as needed for wheezing or shortness of breath.   amoxicillin-clavulanate (AUGMENTIN) 875-125 MG tablet Take 1 tablet by mouth every 12 (twelve) hours.   Budeson-Glycopyrrol-Formoterol (BREZTRI AEROSPHERE) 160-9-4.8 MCG/ACT AERO Inhale 2 puffs into the lungs in the morning and at bedtime.   carbidopa-levodopa (SINEMET IR) 25-100 MG tablet Take 2 tablets by mouth 3 (three) times daily.   cyanocobalamin (VITAMIN B12) 100 MCG tablet Take 100 mcg by mouth daily.   EPINEPHrine 0.3 mg/0.3 mL IJ SOAJ injection Inject 0.3 mg into the muscle as needed for anaphylaxis.   folic acid (FOLVITE) 1 MG tablet TAKE 1 TABLET BY MOUTH EVERY DAY   HYDROcodone-acetaminophen (NORCO) 5-325 MG tablet Take 1 tablet by mouth every 6 (six) hours as needed for moderate pain.   hydrOXYzine (ATARAX) 25 MG tablet Take 25 mg by mouth 2 (two) times daily as needed.   hydrOXYzine (VISTARIL) 50 MG capsule Take 1 capsule (50 mg total) by mouth 3 (three) times daily as needed.   ibuprofen (ADVIL) 400 MG tablet Take 1 tablet (400 mg total) by  mouth every 8 (eight) hours as needed for headache or moderate pain.   KLOR-CON M20 20 MEQ tablet TAKE 1 TABLET BY MOUTH EVERY DAY   OXYGEN Inhale 3 L/min into the lungs See admin instructions. 3 L/min at bedtime and as needed for shortness of breath throughout the day   pantoprazole (PROTONIX) 20 MG tablet Take by mouth.   pantoprazole (PROTONIX) 20 MG tablet Take 1 tablet by mouth daily.   predniSONE (DELTASONE) 20 MG tablet Take 2 tablets (40 mg total) by mouth daily with breakfast for 5 days.   promethazine (PHENERGAN) 12.5 MG tablet  Take 1 tablet (12.5 mg total) by mouth every 12 (twelve) hours as needed for nausea or vomiting.   sertraline (ZOLOFT) 100 MG tablet Take 1 tablet (100 mg total) by mouth daily.   topiramate (TOPAMAX) 50 MG tablet TAKE 1 TABLET BY MOUTH EVERYDAY AT BEDTIME   No facility-administered encounter medications on file as of 10/15/2023.    Allergies (verified) Angiotensin receptor blockers, Meperidine, Strawberry extract, Sucralfate, and Wasp venom   History: Past Medical History:  Diagnosis Date   Allergy    Anemia    Anxiety    Arthritis    CHF (congestive heart failure) (HCC)    Chronic kidney disease    COPD (chronic obstructive pulmonary disease) (HCC)    Depression    GERD (gastroesophageal reflux disease)    Headache    Hyperlipidemia    Hypertension    MVP (mitral valve prolapse)    Parkinson disease (HCC)    Vascular dementia (HCC)    Past Surgical History:  Procedure Laterality Date   ABDOMINAL HYSTERECTOMY     APPENDECTOMY     BALLOON DILATION N/A 04/21/2020   Procedure: BALLOON DILATION;  Surgeon: Rachael Fee, MD;  Location: [facility];  Service: Endoscopy;  Laterality: N/A;  pyloric/ GI   BALLOON DILATION N/A 07/04/2020   Procedure: BALLOON DILATION;  Surgeon: Napoleon Form, MD;  Location: [facility];  Service: Endoscopy;  Laterality: N/A;   BIOPSY  04/21/2020   Procedure: BIOPSY;  Surgeon: Rachael Fee, MD;  Location: [facility];  Service: Endoscopy;;   BIOPSY  07/04/2020   Procedure: BIOPSY;  Surgeon: Napoleon Form, MD;  Location: [facility];  Service: Endoscopy;;  EGD and COLON   BREAST SURGERY Right 1988   tumor removal   CHOLECYSTECTOMY N/A 05/18/2020   Procedure: LAPAROSCOPIC CHOLECYSTECTOMY;  Surgeon: Luretha Murphy, MD;  Location: [facility];  Service: General;  Laterality: N/A;   COLONOSCOPY WITH PROPOFOL N/A 07/04/2020   Procedure: COLONOSCOPY WITH PROPOFOL;  Surgeon: Napoleon Form, MD;  Location: [facility];  Service: Endoscopy;   Laterality: N/A;   COLONOSCOPY WITH PROPOFOL N/A 11/03/2021   Procedure: COLONOSCOPY WITH PROPOFOL;  Surgeon: Regis Bill, MD;  Location: [facility];  Service: Endoscopy;  Laterality: N/A;   COLONOSCOPY WITH PROPOFOL N/A 08/20/2023   Procedure: COLONOSCOPY WITH PROPOFOL;  Surgeon: Regis Bill, MD;  Location: [facility];  Service: Endoscopy;  Laterality: N/A;   ESOPHAGEAL DILATION  08/11/2020   Procedure: PYLORIC DILATION;  Surgeon: Sherrilyn Rist, MD;  Location: [facility];  Service: Gastroenterology;;   ESOPHAGOGASTRODUODENOSCOPY (EGD) WITH PROPOFOL N/A 04/21/2020   Procedure: ESOPHAGOGASTRODUODENOSCOPY (EGD) WITH PROPOFOL;  Surgeon: Rachael Fee, MD;  [facility];  Service: Endoscopy;  Laterality: N/A;   ESOPHAGOGASTRODUODENOSCOPY (EGD) WITH PROPOFOL N/A 07/04/2020   Procedure: ESOPHAGOGASTRODUODENOSCOPY (EGD) WITH PROPOFOL;  Surgeon: Napoleon Form, MD;  [facility]: [facility];  Service:  Endoscopy;  Laterality: N/A;   ESOPHAGOGASTRODUODENOSCOPY (EGD) WITH PROPOFOL N/A 08/11/2020   Procedure: ESOPHAGOGASTRODUODENOSCOPY (EGD) WITH PROPOFOL;  Surgeon: Sherrilyn Rist, MD;  [facility];  Service: Gastroenterology;  Laterality: N/A;   ESOPHAGOGASTRODUODENOSCOPY (EGD) WITH PROPOFOL N/A 11/03/2021   Procedure: ESOPHAGOGASTRODUODENOSCOPY (EGD) WITH PROPOFOL;  Surgeon: Regis Bill, MD;  Location: [facility];  Service: Endoscopy;  Laterality: N/A;   ESOPHAGOGASTRODUODENOSCOPY (EGD) WITH PROPOFOL N/A 05/07/2022   Procedure: ESOPHAGOGASTRODUODENOSCOPY (EGD) WITH PROPOFOL;  Surgeon: Toney Reil, MD;  Location[facility];  Service: Gastroenterology;  Laterality: N/A;   HEMOSTASIS CLIP PLACEMENT  08/20/2023   Procedure: HEMOSTASIS CLIP PLACEMENT;  Surgeon: Regis Bill, MD;  Location: [facility];  Service: Endoscopy;;   KNEE SURGERY  2005   2 times left   KNEE SURGERY Right    KYPHOPLASTY N/A 07/20/2021   Procedure: T  10KYPHOPLASTY;  Surgeon: Kennedy Bucker, MD;  Location: [facility];  Service: Orthopedics;  Laterality: N/A;   KYPHOPLASTY N/A 08/23/2021   Procedure: T9 KYPHOPLASTY;  Surgeon: Kennedy Bucker, MD;  Location: [facility];  Service: Orthopedics;  Laterality: N/A;   POLYPECTOMY  07/04/2020   Procedure: POLYPECTOMY;  Surgeon: Napoleon Form, MD;  [facility];  Service: Endoscopy;;   POLYPECTOMY  08/20/2023   Procedure: POLYPECTOMY;  Surgeon: Regis Bill, MD;  Location: [facility];  Service: Endoscopy;;   SUBMUCOSAL TATTOO INJECTION  08/20/2023   Procedure: SUBMUCOSAL TATTOO INJECTION;  Surgeon: Regis Bill, MD;  Location: [facility];  Service: Endoscopy;;   TUBAL LIGATION     Family History  Problem Relation Age of Onset   Breast cancer Mother 29   Ovarian cancer Mother 28   Brain cancer Mother    Hypertension Father    Hyperlipidemia Father    Heart disease Father    Colon cancer Father        diagnosed in his 81s   Breast cancer Maternal Grandmother 86   Diabetes Paternal Grandmother    Breast cancer Paternal Grandmother 80   Other Son        drug over dose   Colon cancer Maternal Grandfather        dx in his 16s   Liver disease Maternal Grandfather    Breast cancer Maternal Aunt 60   Breast cancer Maternal Aunt 60   Colon cancer Maternal Uncle    Stomach cancer Neg Hx    Pancreatic cancer Neg Hx    Esophageal cancer Neg Hx    Social History   Socioeconomic History   Marital status: Single    Spouse name: Not on file   Number of children: 2   Years of education: Not on file   Highest education level: Not on file  Occupational History   Not on file  Tobacco Use   Smoking status: Former    Current packs/day: 0.00    Average packs/day: 1 pack/day for 15.0 years (15.0 ttl pk-yrs)    Types: Cigarettes    Start date: 09/12/2002    Quit date: 09/12/2017    Years since quitting: 6.0   Smokeless tobacco: Never  Vaping Use   Vaping status: Never  Used  Substance and Sexual Activity   Alcohol use: Never   Drug use: No   Sexual activity: Not Currently  Other Topics Concern   Not on file  Social History Narrative   Lives with Grandson's great grandparents    Grandson - 61 years old Writer)   Daughter - Dot Lanes -  [place]   Support system - family, aunt, Harriett Sine and Dorene Sorrow (who she lives with), brother   Transportation: roommates   Enjoy: [employer], playing with grandson   Exercise: PT exercises   Diet: good - chicken, some steak, tries to eat healthy, avoids fried foods   Retired from being a Lawyer   Social Determinants of Corporate investment banker Strain: Low Risk  (10/15/2023)   Overall Financial Resource Strain (CARDIA)    Difficulty of Paying Living Expenses: Not hard at all  Food Insecurity: No Food Insecurity (10/15/2023)   Hunger Vital Sign    Worried About Running Out of Food in the Last Year: Never true    Ran Out of Food in the Last Year: Never true  Transportation Needs: No Transportation Needs (10/15/2023)   PRAPARE - Administrator, Civil Service (Medical): No    Lack of Transportation (Non-Medical): No  Physical Activity: Insufficiently Active (10/15/2023)   Exercise Vital Sign    Days of Exercise per Week: 3 days    Minutes of Exercise per Session: 30 min  Stress: No Stress Concern Present (10/15/2023)   [employer] of Occupational Health - Occupational Stress Questionnaire    Feeling of Stress : Not at all  Social Connections: Moderately Isolated (10/15/2023)   Social Connection and Isolation Panel [NHANES]    Frequency of Communication with Friends and Family: More than three times a week    Frequency of Social Gatherings with Friends and Family: More than three times a week    Attends Religious Services: More than 4 times per year    Active Member of [employer] or Organizations: No    Attends Engineer, structural: Never    Marital Status: Divorced    Tobacco  Counseling Counseling given: Not Answered   Clinical Intake:  Pre-visit preparation completed: Yes  Pain : No/denies pain     Nutritional Risks: None Diabetes: No  How often do you need to have someone help you when you read instructions, pamphlets, or other written materials from your doctor or pharmacy?: 1 - Never  Interpreter Needed?: No  Information entered by :: Morgan Ora, LPN   Activities of Daily Living    10/15/2023    8:06 AM 06/21/2023   11:00 AM  In your present state of health, do you have any difficulty performing the following activities:  Hearing? 0 0  Vision? 0 0  Difficulty concentrating or making decisions? 0 0  Walking or climbing stairs? 0 0  Dressing or bathing? 0 0  Doing errands, shopping? 0 0  Preparing Food and eating ? N   Using the Toilet? N   In the past six months, have you accidently leaked urine? N   Do you have problems with loss of bowel control? N   Managing your Medications? N   Managing your Finances? N   Housekeeping or managing your Housekeeping? N     Patient Care Team: Mort Sawyers, FNP as PCP - General (Family Medicine) Kathyrn Sheriff, [place] (Inactive) as Pharmacist (Pharmacist) Rickard Patience, MD as Consulting Physician (Hematology and Oncology) Lonell Face, MD as Consulting Physician (Neurology) [place], [place] M, LCSW as Social Worker  Indicate any recent Medical Services you may have received from other than [facility] providers in the past year (date may be approximate).     Assessment:   This is a routine wellness examination for Morgan [name].  Hearing/Vision screen Vision Screening - Comments:: Wears rx  glasses - up to date with routine eye exams with  [facility]    Goals Addressed             This Visit's Progress    Exercise 3x per week (30 min per time)         Depression Screen    10/15/2023    8:05 AM 08/29/2023   11:02 AM 07/08/2023    2:42 PM 05/27/2023   10:04 AM 05/20/2023    9:16 AM 04/04/2023     9:56 AM 02/05/2023    9:55 AM  PHQ 2/9 Scores  PHQ - 2 Score 0 2 2 2 2 2 4   PHQ- 9 Score 0 12 4  5 5 13     Fall Risk    10/15/2023    8:03 AM 08/29/2023   11:02 AM 05/27/2023   10:04 AM 05/20/2023    9:15 AM 04/04/2023    9:56 AM  Fall Risk   Falls in the past year? 0 1 1 1 1   Number falls in past yr: 0 1 1 0 1  Injury with Fall? 0 1 0 0 1  Risk for fall due to : No Fall Risks History of fall(s) History of fall(s)    Follow up Falls prevention discussed Falls evaluation completed Falls evaluation completed;Education provided;Falls prevention discussed Falls evaluation completed;Education provided;Falls prevention discussed Falls evaluation completed;Education provided;Falls prevention discussed    MEDICARE RISK AT HOME: Medicare Risk at Home Any stairs in or around the home?: No If so, are there any without handrails?: No Home free of loose throw rugs in walkways, pet beds, electrical cords, etc?: Yes Adequate lighting in your home to reduce risk of falls?: Yes Life alert?: No Use of a cane, walker or w/c?: No Grab bars in the bathroom?: Yes Shower chair or bench in shower?: Yes Elevated toilet seat or a handicapped toilet?: Yes  TIMED UP AND GO:  Was the test performed?  No    Cognitive Function:        10/15/2023    8:06 AM 10/11/2022    8:27 AM  6CIT Screen  What Year? 0 points 0 points  What month? 0 points 0 points  What time? 0 points 0 points  Count back from 20 0 points 0 points  Months in reverse 0 points 0 points  Repeat phrase 0 points 0 points  Total Score 0 points 0 points    Immunizations Immunization History  Administered Date(s) Administered   Fluad Quad(high Dose 65+) 09/30/2019, 08/29/2020, 08/28/2021   Influenza, High Dose Seasonal PF 08/23/2018   Influenza,inj,Quad PF,6+ Mos 02/24/2015   Moderna Sars-Covid-2 Vaccination 03/18/2020, 04/15/2020   Pneumococcal Conjugate-13 11/12/2018   Pneumococcal Polysaccharide-23 02/24/2015    TDAP  status: Due, Education has been provided regarding the importance of this vaccine. Advised may receive this vaccine at local pharmacy or Health Dept. Aware to provide a copy of the vaccination record if obtained from local pharmacy or Health Dept. Verbalized acceptance and understanding.  Flu Vaccine status: Due, Education has been provided regarding the importance of this vaccine. Advised may receive this vaccine at local pharmacy or Health Dept. Aware to provide a copy of the vaccination record if obtained from local pharmacy or Health Dept. Verbalized acceptance and understanding.  Pneumococcal vaccine status: Up to date  Covid-19 vaccine status: Completed vaccines  Qualifies for Shingles Vaccine? Yes   Zostavax completed No   Shingrix Completed?: No.    Education has been provided regarding the  importance of this vaccine. Patient has been advised to call insurance company to determine out of pocket expense if they have not yet received this vaccine. Advised may also receive vaccine at local pharmacy or Health Dept. Verbalized acceptance and understanding.  Screening Tests Health Maintenance  Topic Date Due   Zoster Vaccines- Shingrix (1 of 2) Never done   Pneumonia Vaccine 39+ Years old (3 of 3 - PPSV23 or PCV20) 02/24/2020   INFLUENZA VACCINE  07/18/2023   MAMMOGRAM  09/01/2023   COVID-19 Vaccine (3 - Moderna risk series) 04/03/2024 (Originally 05/13/2020)   Medicare Annual Wellness (AWV)  10/14/2024   Colonoscopy  08/19/2033   DEXA SCAN  Completed   Hepatitis C Screening  Completed   HPV VACCINES  Aged Out   DTaP/Tdap/Td  Discontinued    Health Maintenance  Health Maintenance Due  Topic Date Due   Zoster Vaccines- Shingrix (1 of 2) Never done   Pneumonia Vaccine 8+ Years old (3 of 3 - PPSV23 or PCV20) 02/24/2020   INFLUENZA VACCINE  07/18/2023   MAMMOGRAM  09/01/2023    Colorectal cancer screening: Type of screening: Colonoscopy. Completed 08/20/2023. Repeat every 10  years  Mammogram status: Ordered scheduled 12/2023. Pt provided with contact info and advised to call to schedule appt.   Bone Density status: Ordered 09/19/2023. Pt provided with contact info and advised to call to schedule appt.  Lung Cancer Screening: (Low Dose CT Chest recommended if Age 50-80 years, 20 pack-year currently smoking OR have quit w/in 15years.) does not qualify.   Lung Cancer Screening Referral: n/a  Additional Screening:  Hepatitis C Screening: does not qualify; Completed 09/24/2017  Vision Screening: Recommended annual ophthalmology exams for early detection of glaucoma and other disorders of the eye. Is the patient up to date with their annual eye exam?  Yes  Who is the provider or what is the name of the office in which the patient attends annual eye exams? [facility]  If pt is not established with a provider, would they like to be referred to a provider to establish care? No .   Dental Screening: Recommended annual dental exams for proper oral hygiene  Community Resource Referral / Chronic Care Management: CRR required this visit?  No   CCM required this visit?  No     Plan:     I have personally reviewed and noted the following in the patient's chart:   Medical and social history Use of alcohol, tobacco or illicit drugs  Current medications and supplements including opioid prescriptions. Patient is not currently taking opioid prescriptions. Functional ability and status Nutritional status Physical activity Advanced directives List of other physicians Hospitalizations, surgeries, and ER visits in previous 12 months Vitals Screenings to include cognitive, depression, and falls Referrals and appointments  In addition, I have reviewed and discussed with patient certain preventive protocols, quality metrics, and best practice recommendations. A written personalized care plan for preventive services as well as general preventive health  recommendations were provided to patient.     Lorrene Morrell, LPN   [id number]   After Visit Summary: (MyChart) Due to this being a telephonic visit, the after visit summary with patients personalized plan was offered to patient via MyChart   Nurse Notes: Due Pneumonia /TDAP Vaccine

## 2023-10-17 DIAGNOSIS — F01A4 Vascular dementia, mild, with anxiety: Secondary | ICD-10-CM | POA: Diagnosis not present

## 2023-10-17 DIAGNOSIS — Z9181 History of falling: Secondary | ICD-10-CM | POA: Diagnosis not present

## 2023-10-17 DIAGNOSIS — G479 Sleep disorder, unspecified: Secondary | ICD-10-CM | POA: Diagnosis not present

## 2023-10-17 DIAGNOSIS — G20C Parkinsonism, unspecified: Secondary | ICD-10-CM | POA: Diagnosis not present

## 2023-10-17 DIAGNOSIS — G43419 Hemiplegic migraine, intractable, without status migrainosus: Secondary | ICD-10-CM | POA: Diagnosis not present

## 2023-10-17 DIAGNOSIS — R55 Syncope and collapse: Secondary | ICD-10-CM | POA: Diagnosis not present

## 2023-10-22 ENCOUNTER — Ambulatory Visit (INDEPENDENT_AMBULATORY_CARE_PROVIDER_SITE_OTHER): Payer: Medicare HMO | Admitting: Family

## 2023-10-22 VITALS — BP 128/88 | HR 69 | Temp 97.8°F | Ht 73.0 in | Wt 185.0 lb

## 2023-10-22 DIAGNOSIS — J0110 Acute frontal sinusitis, unspecified: Secondary | ICD-10-CM

## 2023-10-22 DIAGNOSIS — F411 Generalized anxiety disorder: Secondary | ICD-10-CM | POA: Diagnosis not present

## 2023-10-22 DIAGNOSIS — F331 Major depressive disorder, recurrent, moderate: Secondary | ICD-10-CM | POA: Diagnosis not present

## 2023-10-22 DIAGNOSIS — F4323 Adjustment disorder with mixed anxiety and depressed mood: Secondary | ICD-10-CM | POA: Diagnosis not present

## 2023-10-22 MED ORDER — PREDNISONE 20 MG PO TABS: ORAL_TABLET | ORAL | 0 refills | Status: DC

## 2023-10-22 MED ORDER — LORAZEPAM 0.5 MG PO TABS: 0.5000 mg | ORAL_TABLET | Freq: Every evening | ORAL | 0 refills | Status: DC | PRN

## 2023-10-22 MED ORDER — FLUTICASONE PROPIONATE 50 MCG/ACT NA SUSP: 2.0000 | Freq: Every day | NASAL | 6 refills | Status: DC

## 2023-10-22 MED ORDER — SERTRALINE HCL 100 MG PO TABS: ORAL_TABLET | ORAL | 3 refills | Status: DC

## 2023-10-22 NOTE — Assessment & Plan Note (Addendum)
Increase sertraline to 200 mg once daily  Pt states insurance does not cover therapy, I will refer to CCM to see if any available resources and pt and I agree she would benefit highly from therapy

## 2023-10-22 NOTE — Progress Notes (Signed)
Established Patient Office Visit  Subjective:   Patient ID: Morgan [name], female    DOB: 1951/02/21  Age: 72 y.o. MRN: [id number]  CC:  Chief Complaint  Patient presents with   URI    Reports sinus congestion/pain, PND, sore throat. Denies fever, chills, coughing. Was seen at urgent care and was tested for COVID and the test was negative.     HPI: Morgan [name] is a 72 y.o. female presenting on 10/22/2023 for URI (Reports sinus congestion/pain, PND, sore throat. Denies fever, chills, coughing. Was seen at urgent care and was tested for COVID and the test was negative. )  Went to urgent care [place] for sinus symptoms.  Still with sore throat, sinus pressure, 'face hurts' right ear with pain and stopped up feeling. Completed 7 days augmentin bid as well as prednisone 40 mg x 5 days. No cough, sob and or fever. She states the prednisone helped while she was taking it. For allergies she is using otc antihistamine, recently switched it up from one she had used prior. Tested for covid and was negative.   Dealing with a lot of stress and axfniety at home, worsening due to a room mate that is hostile. She does feel safe at home physically but not mentally. She has tried hydroxyxine 50 mg states this not helping. She is taking sertraline 100 mg once daily as well.       ROS: Negative unless specifically indicated above in HPI.   Relevant past medical history reviewed and updated as indicated.   Allergies and medications reviewed and updated.   Current Outpatient Medications:    acetaminophen (TYLENOL) 325 MG tablet, Take 2 tablets (650 mg total) by mouth every 4 (four) hours as needed for mild pain (or temp > 37.5 C (99.5 F))., Disp: , Rfl:    albuterol (VENTOLIN HFA) 108 (90 Base) MCG/ACT inhaler, Inhale 2 puffs into the lungs every 6 (six) hours as needed for wheezing or shortness of breath., Disp: 1 each, Rfl: 11   Budeson-Glycopyrrol-Formoterol (BREZTRI AEROSPHERE) 160-9-4.8 MCG/ACT  AERO, Inhale 2 puffs into the lungs in the morning and at bedtime., Disp: 1 each, Rfl: 11   carbidopa-levodopa (SINEMET IR) 25-100 MG tablet, Take 2 tablets by mouth 3 (three) times daily., Disp: , Rfl:    cyanocobalamin (VITAMIN B12) 100 MCG tablet, Take 100 mcg by mouth daily., Disp: , Rfl:    EPINEPHrine 0.3 mg/0.3 mL IJ SOAJ injection, Inject 0.3 mg into the muscle as needed for anaphylaxis., Disp: 1 each, Rfl: 1   fluticasone (FLONASE) 50 MCG/ACT nasal spray, Place 2 sprays into both nostrils daily., Disp: 16 g, Rfl: 6   folic acid (FOLVITE) 1 MG tablet, TAKE 1 TABLET BY MOUTH EVERY DAY, Disp: 90 tablet, Rfl: 1   hydrOXYzine (VISTARIL) 50 MG capsule, Take 1 capsule (50 mg total) by mouth 3 (three) times daily as needed., Disp: 30 capsule, Rfl: 0   ibuprofen (ADVIL) 400 MG tablet, Take 1 tablet (400 mg total) by mouth every 8 (eight) hours as needed for headache or moderate pain., Disp: 20 tablet, Rfl: 0   KLOR-CON M20 20 MEQ tablet, TAKE 1 TABLET BY MOUTH EVERY DAY, Disp: 90 tablet, Rfl: 1   LORazepam (ATIVAN) 0.5 MG tablet, Take 1 tablet (0.5 mg total) by mouth at bedtime as needed for anxiety., Disp: 30 tablet, Rfl: 0   OXYGEN, Inhale 3 L/min into the lungs See admin instructions. 3 L/min at bedtime and as needed for shortness of  breath throughout the day, Disp: , Rfl:    pantoprazole (PROTONIX) 20 MG tablet, Take by mouth., Disp: , Rfl:    pantoprazole (PROTONIX) 20 MG tablet, Take 1 tablet by mouth daily., Disp: , Rfl:    predniSONE (DELTASONE) 20 MG tablet, Take 2 tablets by mouth once daily for 5 days., Disp: 10 tablet, Rfl: 0   promethazine (PHENERGAN) 12.5 MG tablet, Take 1 tablet (12.5 mg total) by mouth every 12 (twelve) hours as needed for nausea or vomiting., Disp: 20 tablet, Rfl: 0   sertraline (ZOLOFT) 100 MG tablet, Take two tablets once daily for total 200 mg daily, Disp: 180 tablet, Rfl: 3   topiramate (TOPAMAX) 50 MG tablet, TAKE 1 TABLET BY MOUTH EVERYDAY AT BEDTIME, Disp: 90  tablet, Rfl: 0  Allergies  Allergen Reactions   Angiotensin Receptor Blockers Swelling    Angioedema   Meperidine Hives and Anaphylaxis   Strawberry Extract Hives and Swelling   Sucralfate Nausea Only and Other (See Comments)    Severe nausea Other reaction(s): Other (See Comments) Severe nausea Severe nausea   Wasp Venom Hives    Objective:   BP 128/88 (BP Location: Left Arm, Patient Position: Sitting, Cuff Size: Normal)   Pulse 69   Temp 97.8 F (36.6 C)   Ht 6\' 1"  (1.854 m)   Wt 185 lb (83.9 kg)   SpO2 94%   BMI 24.41 kg/m    Physical Exam Constitutional:      General: She is not in acute distress.    Appearance: Normal appearance. She is normal weight. She is not ill-appearing, toxic-appearing or diaphoretic.  HENT:     Head: Normocephalic.     Right Ear: Tympanic membrane normal.     Left Ear: Tympanic membrane normal.     Nose:     Right Turbinates: Enlarged and swollen.     Right Sinus: Frontal sinus tenderness present.     Left Sinus: Frontal sinus tenderness present.     Mouth/Throat:     Mouth: Mucous membranes are dry.     Pharynx: No oropharyngeal exudate or posterior oropharyngeal erythema.  Eyes:     Extraocular Movements: Extraocular movements intact.     Pupils: Pupils are equal, round, and reactive to light.  Cardiovascular:     Rate and Rhythm: Normal rate and regular rhythm.     Pulses: Normal pulses.     Heart sounds: Normal heart sounds.  Pulmonary:     Effort: Pulmonary effort is normal.     Breath sounds: Normal breath sounds.  Musculoskeletal:     Cervical back: Normal range of motion.  Neurological:     General: No focal deficit present.     Mental Status: She is alert and oriented to person, place, and time. Mental status is at baseline.  Psychiatric:        Mood and Affect: Mood normal.        Behavior: Behavior normal.        Thought Content: Thought content normal.        Judgment: Judgment normal.     Assessment & Plan:   Acute non-recurrent frontal sinusitis Assessment & Plan: Previously treated with augmentin and prednisone x five days.  Will extend prednisone for 40 mg x 5 days more.  Recommendation for daily flonase, continue antihistamine. If no improvement in 2-3 days may have to consider levaquin.   Orders: -     predniSONE; Take 2 tablets by mouth once daily for 5 days.  Dispense: 10 tablet; Refill: 0 -     Fluticasone Propionate; Place 2 sprays into both nostrils daily.  Dispense: 16 g; Refill: 6  Moderate episode of recurrent major depressive disorder (HCC)  Anxiety state Assessment & Plan: Pdmp reviewed, non opiod drug contract signed [facility]DS next visit.  Increased risk for falls, strong discussion with patient to start half tablet prn and keep fall risk factors in place. Do not take with alcohol and or pain medication or other CNS depressant.    Orders: -     LORazepam; Take 1 tablet (0.5 mg total) by mouth at bedtime as needed for anxiety.  Dispense: 30 tablet; Refill: 0  Adjustment reaction with anxiety and depression Assessment & Plan: Increase sertraline to 200 mg once daily  Pt states insurance does not cover therapy, I will refer to CCM to see if any available resources and pt and I agree she would benefit highly from therapy   Orders: -     Sertraline HCl; Take two tablets once daily for total 200 mg daily  Dispense: 180 tablet; Refill: 3 -     LORazepam; Take 1 tablet (0.5 mg total) by mouth at bedtime as needed for anxiety.  Dispense: 30 tablet; Refill: 0     Follow up plan: Return in about 3 weeks (around 11/12/2023) for f/u anxiety.  Mort Sawyers, FNP

## 2023-10-22 NOTE — Assessment & Plan Note (Signed)
Pdmp reviewed, non opiod drug contract signed [facility]DS next visit.  Increased risk for falls, strong discussion with patient to start half tablet prn and keep fall risk factors in place. Do not take with alcohol and or pain medication or other CNS depressant.

## 2023-10-22 NOTE — Addendum Note (Signed)
Addended by: Mort Sawyers on: 10/22/2023 02:28 PM   Modules accepted: Orders

## 2023-10-22 NOTE — Patient Instructions (Signed)
  Increase sertraline to 200 mg once daily (take two tablets of 100 mg each)  Lorazepam as needed for severe anxiety.   Prednisone for sinus stuff.  I do recommend you start flonase nose spray over the counter as well.    Regards,   Mort Sawyers FNP-C

## 2023-10-22 NOTE — Assessment & Plan Note (Signed)
Previously treated with augmentin and prednisone x five days.  Will extend prednisone for 40 mg x 5 days more.  Recommendation for daily flonase, continue antihistamine. If no improvement in 2-3 days may have to consider levaquin.

## 2023-10-23 ENCOUNTER — Other Ambulatory Visit: Payer: Self-pay | Admitting: Family

## 2023-10-23 ENCOUNTER — Telehealth: Payer: Self-pay | Admitting: *Deleted

## 2023-10-23 DIAGNOSIS — G43419 Hemiplegic migraine, intractable, without status migrainosus: Secondary | ICD-10-CM

## 2023-10-23 NOTE — Progress Notes (Signed)
  Care Coordination  Outreach Note  10/23/2023 Name: Morgan [name] MRN: [id number] DOB: 1951/06/09   Care Coordination Outreach Attempts: An unsuccessful telephone outreach was attempted today to offer the patient information about available care coordination services.  Follow Up Plan:  Additional outreach attempts will be made to offer the patient care coordination information and services.   Encounter Outcome:  No Answer  Burman Nieves, CCMA Care Coordination Care Guide Direct Dial: [phone]

## 2023-10-24 NOTE — Progress Notes (Signed)
  Care Coordination  Outreach Note  10/24/2023 Name: Morgan [name] MRN: [id number] DOB: [birth year]   Care Coordination Outreach Attempts: A second unsuccessful outreach was attempted today to offer the patient with information about available care coordination services.  Follow Up Plan:  Additional outreach attempts will be made to offer the patient care coordination information and services.   Encounter Outcome:  No Answer  Burman Nieves, CCMA Care Coordination Care Guide Direct Dial: [phone]

## 2023-10-25 NOTE — Progress Notes (Signed)
  Care Coordination  Outreach Note  10/25/2023 Name: Morgan [name] MRN: [id number] DOB: 14-Nov-1951   Care Coordination Outreach Attempts: A third unsuccessful outreach was attempted today to offer the patient with information about available care coordination services.  Follow Up Plan:  No further outreach attempts will be made at this time. We have been unable to contact the patient to offer or enroll patient in care coordination services  Encounter Outcome:  No Answer  Burman Nieves, [facility] Care Coordination Care Guide Direct Dial: [phone]

## 2023-10-26 DIAGNOSIS — J449 Chronic obstructive pulmonary disease, unspecified: Secondary | ICD-10-CM | POA: Diagnosis not present

## 2023-10-26 DIAGNOSIS — I509 Heart failure, unspecified: Secondary | ICD-10-CM | POA: Diagnosis not present

## 2023-10-28 DIAGNOSIS — J449 Chronic obstructive pulmonary disease, unspecified: Secondary | ICD-10-CM | POA: Diagnosis not present

## 2023-10-28 DIAGNOSIS — I509 Heart failure, unspecified: Secondary | ICD-10-CM | POA: Diagnosis not present

## 2023-11-09 NOTE — ED Provider Notes (Signed)
[name]    CSN: [id number] Arrival date & time: 10/13/23  0955      History   Chief Complaint Chief Complaint  Patient presents with   Sore Throat   Sinus Problems    HPI Morgan [name] is a 72 y.o. female.   Patient here today for evaluation of sore throat that started several days ago.  She states that she also has had significant sinus congestion and pain as well as headache and now cough.  She does have known COPD.  Cough has been productive at times.  She denies any fever.  She has tried over-the-counter medication without resolution.  The history is provided by the patient.  Sore Throat Pertinent negatives include no abdominal pain and no shortness of breath.    Past Medical History:  Diagnosis Date   Allergy    Anemia    Anxiety    Arthritis    CHF (congestive heart failure) (HCC)    Chronic kidney disease    COPD (chronic obstructive pulmonary disease) (HCC)    Depression    GERD (gastroesophageal reflux disease)    Headache    Hyperlipidemia    Hypertension    MVP (mitral valve prolapse)    Parkinson disease (HCC)    Vascular dementia (HCC)     Patient Active Problem List   Diagnosis Date Noted   Anxiety state 10/22/2023   Acute non-recurrent frontal sinusitis 10/22/2023   At high risk for falls 08/30/2023   Multiple falls 08/30/2023   History of angiotensin II receptor antagonist allergy 07/03/2023   Angioedema 06/21/2023   Blurry vision 05/27/2023   Vision changes 05/27/2023   Bilateral tinnitus 05/20/2023   History of syncope 05/20/2023   LOC (loss of consciousness) (HCC) 04/04/2023   Adjustment reaction with anxiety and depression 02/05/2023   Chronic diarrhea 02/05/2023   Carotid stenosis 10/02/2022   Gastroparesis 09/27/2022   Lipoma of colon 09/21/2022   Atherosclerosis of aorta (HCC) 09/21/2022   Diverticulosis 09/21/2022   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    History of ischemic stroke 09/21/2022   History of colon polyps 09/21/2022   Parkinson disease (HCC)    Hemiplegia, unspecified affecting left nondominant side (HCC) 04/16/2022   Hypertensive heart and chronic kidney disease with heart failure and stage 1 through stage 4 chronic kidney disease, or unspecified chronic kidney disease (HCC) 04/16/2022   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   Hemiplegic migraine, not intractable, 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   Vitamin B12 deficiency 02/20/2022   Vitamin D deficiency 02/20/2022   Vascular dementia, unspecified severity, with anxiety (HCC) 01/10/2022   Tremor 10/27/2021   Stage 3a chronic kidney disease (HCC) 09/11/2021   IDA (iron deficiency anemia) 09/11/2021   Osteopenia 08/28/2021   DOE (dyspnea on exertion) 08/28/2021   Chronic diastolic CHF (congestive heart failure) (HCC) 08/09/2020   Pyloric stenosis in adult    Peptic ulcer disease 03/22/2020   Chronic abdominal pain 02/19/2019   CKD (chronic kidney disease) stage 3, GFR 30-59 ml/min (HCC) 01/13/2019   Oropharyngeal dysphagia 11/12/2018   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/2018   COPD (chronic obstructive pulmonary disease) (HCC) 01/14/2018   Non-rheumatic mitral regurgitation 11/21/2017   HLD (hyperlipidemia) 10/04/2017   GERD (gastroesophageal reflux disease) 10/04/2017   Left hemiparesis (HCC) 10/04/2017   Essential hypertension    Chronic lower back pain 04/29/2013   Paresthesia 04/29/2013    Past Surgical History:  Procedure  Laterality Date   ABDOMINAL HYSTERECTOMY     APPENDECTOMY     BALLOON DILATION N/A 04/21/2020   Procedure: BALLOON DILATION;  Surgeon: Rachael Fee, MD;  Location: [facility];  Service: Endoscopy;  Laterality: N/A;  pyloric/ GI   BALLOON DILATION N/A 07/04/2020   Procedure: BALLOON DILATION;  Surgeon: Napoleon Form, MD;  Location: [facility];  Service: Endoscopy;  Laterality: N/A;   BIOPSY  04/21/2020   Procedure:  BIOPSY;  Surgeon: Rachael Fee, MD;  Location: [facility];  Service: Endoscopy;;   BIOPSY  07/04/2020   Procedure: BIOPSY;  Surgeon: Napoleon Form, MD;  Location: [facility];  Service: Endoscopy;;  EGD and COLON   BREAST SURGERY Right 1988   tumor removal   CHOLECYSTECTOMY N/A 05/18/2020   Procedure: LAPAROSCOPIC CHOLECYSTECTOMY;  Surgeon: Luretha Murphy, MD;  Location: [facility];  Service: General;  Laterality: N/A;   COLONOSCOPY WITH PROPOFOL N/A 07/04/2020   Procedure: COLONOSCOPY WITH PROPOFOL;  Surgeon: Napoleon Form, MD;  Location: [facility];  Service: Endoscopy;  Laterality: N/A;   COLONOSCOPY WITH PROPOFOL N/A 11/03/2021   Procedure: COLONOSCOPY WITH PROPOFOL;  Surgeon: Regis Bill, MD;  Location: [facility];  Service: Endoscopy;  Laterality: N/A;   COLONOSCOPY WITH PROPOFOL N/A 08/20/2023   Procedure: COLONOSCOPY WITH PROPOFOL;  Surgeon: Regis Bill, MD;  Location: [facility];  Service: Endoscopy;  Laterality: N/A;   ESOPHAGEAL DILATION  08/11/2020   Procedure: PYLORIC DILATION;  Surgeon: Sherrilyn Rist, MD;  Location: [facility];  Service: Gastroenterology;;   ESOPHAGOGASTRODUODENOSCOPY (EGD) WITH PROPOFOL N/A 04/21/2020   Procedure: ESOPHAGOGASTRODUODENOSCOPY (EGD) WITH PROPOFOL;  Surgeon: Rachael Fee, MD;  Location: [facility];  Service: Endoscopy;  Laterality: N/A;   ESOPHAGOGASTRODUODENOSCOPY (EGD) WITH PROPOFOL N/A 07/04/2020   Procedure: ESOPHAGOGASTRODUODENOSCOPY (EGD) WITH PROPOFOL;  Surgeon: Napoleon Form, MD;  Location: [facility];  Service: Endoscopy;  Laterality: N/A;   ESOPHAGOGASTRODUODENOSCOPY (EGD) WITH PROPOFOL N/A 08/11/2020   Procedure: ESOPHAGOGASTRODUODENOSCOPY (EGD) WITH PROPOFOL;  Surgeon: Sherrilyn Rist, MD;  Location: [facility];  Service: Gastroenterology;  Laterality: N/A;   ESOPHAGOGASTRODUODENOSCOPY (EGD) WITH PROPOFOL N/A 11/03/2021   Procedure: ESOPHAGOGASTRODUODENOSCOPY (EGD) WITH PROPOFOL;   Surgeon: Regis Bill, MD;  Location: [facility];  Service: Endoscopy;  Laterality: N/A;   ESOPHAGOGASTRODUODENOSCOPY (EGD) WITH PROPOFOL N/A 05/07/2022   Procedure: ESOPHAGOGASTRODUODENOSCOPY (EGD) WITH PROPOFOL;  Surgeon: Toney Reil, MD;  Location: [facility];  Service: Gastroenterology;  Laterality: N/A;   HEMOSTASIS CLIP PLACEMENT  08/20/2023   Procedure: HEMOSTASIS CLIP PLACEMENT;  Surgeon: Regis Bill, MD;  Location: [facility];  Service: Endoscopy;;   KNEE SURGERY  2005   2 times left   KNEE SURGERY Right    KYPHOPLASTY N/A 07/20/2021   Procedure: T 10KYPHOPLASTY;  Surgeon: Kennedy Bucker, MD;  Location: [facility];  Service: Orthopedics;  Laterality: N/A;   KYPHOPLASTY N/A 08/23/2021   Procedure: T9 KYPHOPLASTY;  Surgeon: Kennedy Bucker, MD;  Location: [facility];  Service: Orthopedics;  Laterality: N/A;   POLYPECTOMY  07/04/2020   Procedure: POLYPECTOMY;  Surgeon: Napoleon Form, MD;  Location: [facility];  Service: Endoscopy;;   POLYPECTOMY  08/20/2023   Procedure: POLYPECTOMY;  Surgeon: Regis Bill, MD;  Location: [facility];  Service: Endoscopy;;   SUBMUCOSAL TATTOO INJECTION  08/20/2023   Procedure: SUBMUCOSAL TATTOO INJECTION;  Surgeon: Regis Bill, MD;  Location: [facility];  Service: Endoscopy;;   TUBAL LIGATION      OB History   No  obstetric history on file.      Home Medications    Prior to Admission medications   Medication Sig Start Date End Date Taking? Authorizing Provider  acetaminophen (TYLENOL) 325 MG tablet Take 2 tablets (650 mg total) by mouth every 4 (four) hours as needed for mild pain (or temp > 37.5 C (99.5 F)). 01/30/22   Marrion Coy, MD  albuterol (VENTOLIN HFA) 108 (90 Base) MCG/ACT inhaler Inhale 2 puffs into the lungs every 6 (six) hours as needed for wheezing or shortness of breath. 02/20/23   Hunsucker, Lesia Sago, MD  Budeson-Glycopyrrol-Formoterol (BREZTRI AEROSPHERE) 160-9-4.8 MCG/ACT AERO Inhale  2 puffs into the lungs in the morning and at bedtime. 02/20/23   Hunsucker, Lesia Sago, MD  carbidopa-levodopa (SINEMET IR) 25-100 MG tablet Take 2 tablets by mouth 3 (three) times daily. 11/21/21   [provider]  cyanocobalamin (VITAMIN B12) 100 MCG tablet Take 100 mcg by mouth daily.    [provider]  EPINEPHrine 0.3 mg/0.3 mL IJ SOAJ injection Inject 0.3 mg into the muscle as needed for anaphylaxis. 07/03/23   Mort Sawyers, FNP  fluticasone (FLONASE) 50 MCG/ACT nasal spray Place 2 sprays into both nostrils daily. 10/22/23   Mort Sawyers, FNP  folic acid (FOLVITE) 1 MG tablet TAKE 1 TABLET BY MOUTH EVERY DAY 08/29/23   Mort Sawyers, FNP  hydrOXYzine (VISTARIL) 50 MG capsule Take 1 capsule (50 mg total) by mouth 3 (three) times daily as needed. 09/25/23   Mort Sawyers, FNP  ibuprofen (ADVIL) 400 MG tablet Take 1 tablet (400 mg total) by mouth every 8 (eight) hours as needed for headache or moderate pain. 08/09/22   Enedina Finner, MD  KLOR-CON M20 20 MEQ tablet TAKE 1 TABLET BY MOUTH EVERY DAY 08/29/23   Mort Sawyers, FNP  LORazepam (ATIVAN) 0.5 MG tablet Take 1 tablet (0.5 mg total) by mouth at bedtime as needed for anxiety. 10/22/23   Mort Sawyers, FNP  OXYGEN Inhale 3 L/min into the lungs See admin instructions. 3 L/min at bedtime and as needed for shortness of breath throughout the day    [provider]  pantoprazole (PROTONIX) 20 MG tablet Take by mouth. 01/02/23 01/02/24  [provider]  pantoprazole (PROTONIX) 20 MG tablet Take 1 tablet by mouth daily. 09/25/23   [provider]  predniSONE (DELTASONE) 20 MG tablet Take 2 tablets by mouth once daily for 5 days. 10/22/23   Mort Sawyers, FNP  promethazine (PHENERGAN) 12.5 MG tablet Take 1 tablet (12.5 mg total) by mouth every 12 (twelve) hours as needed for nausea or vomiting. 05/20/23   Mort Sawyers, FNP  sertraline (ZOLOFT) 100 MG tablet Take two tablets once daily for total 200 mg daily 10/22/23    Mort Sawyers, FNP  topiramate (TOPAMAX) 50 MG tablet TAKE 1 TABLET BY MOUTH EVERYDAY AT BEDTIME 10/23/23   Mort Sawyers, FNP    Family History Family History  Problem Relation Age of Onset   Breast cancer Mother 70   Ovarian cancer Mother 29   Brain cancer Mother    Hypertension Father    Hyperlipidemia Father    Heart disease Father    Colon cancer Father        diagnosed in his 39s   Breast cancer Maternal Grandmother 72   Diabetes Paternal Grandmother    Breast cancer Paternal Grandmother 25   Other Son        drug over dose   Colon cancer Maternal Grandfather  dx in his 32s   Liver disease Maternal Grandfather    Breast cancer Maternal Aunt 60   Breast cancer Maternal Aunt 60   Colon cancer Maternal Uncle    Stomach cancer Neg Hx    Pancreatic cancer Neg Hx    Esophageal cancer Neg Hx     Social History Social History   Tobacco Use   Smoking status: Former    Current packs/day: 0.00    Average packs/day: 1 pack/day for 15.0 years (15.0 ttl pk-yrs)    Types: Cigarettes    Start date: 09/12/2002    Quit date: 09/12/2017    Years since quitting: 6.1   Smokeless tobacco: Never  Vaping Use   Vaping status: Never Used  Substance Use Topics   Alcohol use: Never   Drug use: No     Allergies   Angiotensin receptor blockers, Meperidine, Strawberry extract, Sucralfate, and Wasp venom   Review of Systems Review of Systems  Constitutional:  Negative for chills and fever.  HENT:  Positive for congestion, sinus pressure and sore throat. Negative for ear pain.   Eyes:  Negative for discharge and redness.  Respiratory:  Positive for cough. Negative for shortness of breath and wheezing.   Gastrointestinal:  Negative for abdominal pain, diarrhea, nausea and vomiting.     Physical Exam Triage Vital Signs ED Triage Vitals  Encounter Vitals Group     BP 10/13/23 1006 123/76     Systolic BP Percentile --      Diastolic BP Percentile --      Pulse Rate  10/13/23 1006 71     Resp 10/13/23 1006 (!) 24     Temp 10/13/23 1006 98.3 F (36.8 C)     Temp Source 10/13/23 1006 Oral     SpO2 10/13/23 1006 98 %     Weight 10/13/23 1004 185 lb (83.9 kg)     Height 10/13/23 1004 6\' 1"  (1.854 m)     Head Circumference --      Peak Flow --      Pain Score 10/13/23 1002 3     Pain Loc --      Pain Education --      Exclude from Growth Chart --    No data found.  Updated Vital Signs BP 123/76 (BP Location: Left Arm)   Pulse 71   Temp 98.3 F (36.8 C) (Oral)   Resp (!) 22   Ht 6\' 1"  (1.854 m)   Wt 185 lb (83.9 kg)   SpO2 98%   BMI 24.41 kg/m      Physical Exam Vitals and nursing note reviewed.  Constitutional:      General: She is not in acute distress.    Appearance: Normal appearance. She is not ill-appearing.  HENT:     Head: Normocephalic and atraumatic.     Right Ear: Tympanic membrane normal.     Left Ear: Tympanic membrane normal.     Nose: Congestion present.     Mouth/Throat:     Mouth: Mucous membranes are moist.     Pharynx: No oropharyngeal exudate or posterior oropharyngeal erythema.  Eyes:     Conjunctiva/sclera: Conjunctivae normal.  Cardiovascular:     Rate and Rhythm: Normal rate and regular rhythm.     Heart sounds: Normal heart sounds. No murmur heard. Pulmonary:     Effort: Pulmonary effort is normal. No respiratory distress.     Breath sounds: Normal breath sounds. No wheezing, rhonchi or rales.  Skin:    General: Skin is warm and dry.  Neurological:     Mental Status: She is alert.  Psychiatric:        Mood and Affect: Mood normal.        Thought Content: Thought content normal.      UC Treatments / Results  Labs (all labs ordered are listed, but only abnormal results are displayed) Labs Reviewed  SARS CORONAVIRUS 2 (TAT 6-24 HRS)    EKG   Radiology No results found.  Procedures Procedures (including critical care time)  Medications Ordered in UC Medications - No data to  display  Initial Impression / Assessment and Plan / UC Course  I have reviewed the triage vital signs and the nursing notes.  Pertinent labs & imaging results that were available during my care of the patient were reviewed by me and considered in my medical decision making (see chart for details).    Will treat to cover COPD exacerbation with steroid burst and Augmentin.  Augmentin ultimately prescribed given secondary concern for sinusitis.  Recommended follow-up if no gradual improvement with treatment or with any further concerns.  COVID screening ordered.  Final Clinical Impressions(s) / UC Diagnoses   Final diagnoses:  COPD exacerbation [facility])   Discharge Instructions   None    ED Prescriptions     Medication Sig Dispense Auth. Provider   predniSONE (DELTASONE) 20 MG tablet Take 2 tablets (40 mg total) by mouth daily with breakfast for 5 days. 10 tablet Tomi Bamberger, PA-C   amoxicillin-clavulanate (AUGMENTIN) 875-125 MG tablet Take 1 tablet by mouth every 12 (twelve) hours. 14 tablet Tomi Bamberger, PA-C      PDMP not reviewed this encounter.   Tomi Bamberger, PA-C 11/09/23 [phone]

## 2023-11-12 ENCOUNTER — Ambulatory Visit (INDEPENDENT_AMBULATORY_CARE_PROVIDER_SITE_OTHER): Payer: Medicare HMO | Admitting: Family

## 2023-11-12 ENCOUNTER — Encounter: Payer: Self-pay | Admitting: Family

## 2023-11-12 ENCOUNTER — Telehealth: Payer: Self-pay | Admitting: *Deleted

## 2023-11-12 VITALS — BP 136/86 | HR 80 | Temp 97.9°F | Ht 73.0 in | Wt 195.0 lb

## 2023-11-12 DIAGNOSIS — H1031 Unspecified acute conjunctivitis, right eye: Secondary | ICD-10-CM

## 2023-11-12 DIAGNOSIS — Z23 Encounter for immunization: Secondary | ICD-10-CM

## 2023-11-12 DIAGNOSIS — F4323 Adjustment disorder with mixed anxiety and depressed mood: Secondary | ICD-10-CM | POA: Diagnosis not present

## 2023-11-12 DIAGNOSIS — G43409 Hemiplegic migraine, not intractable, without status migrainosus: Secondary | ICD-10-CM

## 2023-11-12 MED ORDER — BUSPIRONE HCL 5 MG PO TABS: 5.0000 mg | ORAL_TABLET | Freq: Two times a day (BID) | ORAL | 2 refills | Status: DC

## 2023-11-12 MED ORDER — OFLOXACIN 0.3 % OP SOLN: 1.0000 [drp] | Freq: Four times a day (QID) | OPHTHALMIC | 0 refills | Status: AC

## 2023-11-12 NOTE — Progress Notes (Unsigned)
Established Patient Office Visit  Subjective:      CC:  Chief Complaint  Patient presents with   Medical Management of Chronic Issues    HPI: Morgan [name] is a 72 y.o. female presenting on 11/12/2023 for Medical Management of Chronic Issues . Anxiety depression: increased sertraline 200 mg once daily and also given lorazepam. Having to take this about daily. Room mate situation is not improving, at current unable to get out of this situation due to financial burden. Did go out for a walk last night and this was helpful. Did refer to CCM last visit however states did not receive call, referral appears to be closed. She states therapy is not covered with her insurance, but she would highly benefit from this. Has tried lexapro in the past.   New complaints: Having headaches every single day, feels it is from stress. 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 Recommendation was to wean off of topiramate and use promethazine prn nausea. She is completely weaned off of topiramate per her report. EEG was ordered. Advised to f/u with neuro x 4 months.   Started with right eye crusting in am and gritty sensation. No blurry vision. Not improving.    Social history:  Relevant past medical, surgical, family and social history reviewed and updated as indicated. Interim medical history since our last visit reviewed.  Allergies and medications reviewed and updated.  DATA REVIEWED: CHART IN EPIC     ROS: Negative unless specifically indicated above in HPI.    Current Outpatient Medications:    acetaminophen (TYLENOL) 325 MG tablet, Take 2 tablets (650 mg total) by mouth every 4 (four) hours as needed for mild pain (or temp > 37.5 C (99.5 F))., Disp: , Rfl:    albuterol (VENTOLIN HFA) 108 (90 Base) MCG/ACT inhaler, Inhale 2 puffs into the lungs every 6 (six) hours as needed for wheezing or shortness of breath., Disp: 1 each, Rfl:  11   Budeson-Glycopyrrol-Formoterol (BREZTRI AEROSPHERE) 160-9-4.8 MCG/ACT AERO, Inhale 2 puffs into the lungs in the morning and at bedtime., Disp: 1 each, Rfl: 11   busPIRone (BUSPAR) 5 MG tablet, Take 1 tablet (5 mg total) by mouth 2 (two) times daily., Disp: 60 tablet, Rfl: 2   carbidopa-levodopa (SINEMET IR) 25-100 MG tablet, Take 2 tablets by mouth 3 (three) times daily., Disp: , Rfl:    cyanocobalamin (VITAMIN B12) 100 MCG tablet, Take 100 mcg by mouth daily., Disp: , Rfl:    EPINEPHrine 0.3 mg/0.3 mL IJ SOAJ injection, Inject 0.3 mg into the muscle as needed for anaphylaxis., Disp: 1 each, Rfl: 1   fluticasone (FLONASE) 50 MCG/ACT nasal spray, Place 2 sprays into both nostrils daily., Disp: 16 g, Rfl: 6   folic acid (FOLVITE) 1 MG tablet, TAKE 1 TABLET BY MOUTH EVERY DAY, Disp: 90 tablet, Rfl: 1   hydrOXYzine (VISTARIL) 50 MG capsule, Take 1 capsule (50 mg total) by mouth 3 (three) times daily as needed., Disp: 30 capsule, Rfl: 0   ibuprofen (ADVIL) 400 MG tablet, Take 1 tablet (400 mg total) by mouth every 8 (eight) hours as needed for headache or moderate pain., Disp: 20 tablet, Rfl: 0   KLOR-CON M20 20 MEQ tablet, TAKE 1 TABLET BY MOUTH EVERY DAY, Disp: 90 tablet, Rfl: 1   LORazepam (ATIVAN) 0.5 MG tablet, Take 1 tablet (0.5 mg total) by mouth at bedtime as needed for anxiety., Disp: 30 tablet, Rfl: 0  ofloxacin (OCUFLOX) 0.3 % ophthalmic solution, Place 1 drop into the right eye 4 (four) times daily for 7 days., Disp: 1.4 mL, Rfl: 0   OXYGEN, Inhale 3 L/min into the lungs See admin instructions. 3 L/min at bedtime and as needed for shortness of breath throughout the day, Disp: , Rfl:    pantoprazole (PROTONIX) 20 MG tablet, Take by mouth., Disp: , Rfl:    pantoprazole (PROTONIX) 20 MG tablet, Take 1 tablet by mouth daily., Disp: , Rfl:    promethazine (PHENERGAN) 12.5 MG tablet, Take 1 tablet (12.5 mg total) by mouth every 12 (twelve) hours as needed for nausea or vomiting., Disp: 20  tablet, Rfl: 0   sertraline (ZOLOFT) 100 MG tablet, Take two tablets once daily for total 200 mg daily, Disp: 180 tablet, Rfl: 3   topiramate (TOPAMAX) 50 MG tablet, TAKE 1 TABLET BY MOUTH EVERYDAY AT BEDTIME, Disp: 90 tablet, Rfl: 1      Objective:    BP 136/86 (BP Location: Left Arm, Patient Position: Sitting, Cuff Size: Normal)   Pulse 80   Temp 97.9 F (36.6 C) (Temporal)   Ht 6\' 1"  (1.854 m)   Wt 195 lb (88.5 kg)   SpO2 98%   BMI 25.73 kg/m   Wt Readings from Last 3 Encounters:  11/12/23 195 lb (88.5 kg)  10/22/23 185 lb (83.9 kg)  10/15/23 175 lb (79.4 kg)    Physical Exam Constitutional:      General: She is not in acute distress.    Appearance: Normal appearance. She is normal weight. She is not ill-appearing, toxic-appearing or diaphoretic.  HENT:     Head: Normocephalic.     Right Ear: Tympanic membrane normal.     Left Ear: Tympanic membrane normal.     Nose: Nose normal.     Mouth/Throat:     Mouth: Mucous membranes are dry.     Pharynx: No oropharyngeal exudate or posterior oropharyngeal erythema.  Eyes:     General: Lids are normal. No allergic shiner, visual field deficit or scleral icterus.       Right eye: Discharge present. No foreign body or hordeolum.     Extraocular Movements: Extraocular movements intact.     Right eye: Normal extraocular motion.     Conjunctiva/sclera:     Right eye: Right conjunctiva is injected.     Pupils: Pupils are equal, round, and reactive to light.  Cardiovascular:     Rate and Rhythm: Normal rate and regular rhythm.     Pulses: Normal pulses.     Heart sounds: Normal heart sounds.  Pulmonary:     Effort: Pulmonary effort is normal.     Breath sounds: Normal breath sounds.  Musculoskeletal:     Cervical back: Normal range of motion.  Neurological:     General: No focal deficit present.     Mental Status: She is alert and oriented to person, place, and time. Mental status is at baseline.  Psychiatric:        Mood  and Affect: Mood normal.        Behavior: Behavior normal.        Thought Content: Thought content normal.        Judgment: Judgment normal.           Assessment & Plan:  Influenza vaccine needed -     Flu Vaccine QUAD High Dose(Fluad)  Adjustment reaction with anxiety and depression Assessment & Plan: Cont sertraline 200 mg once daily Start  buspirone 5 mg twice daily Placed follow up note with CCM, pt has been scheduled for next Tuesday with social worker for options available for therapy for ongoing depression anxiety.  Did d/w pt possibility of increased fatigue, drowsiness with addition of buspirone and how to minimize fall risks.    Orders: -     busPIRone HCl; Take 1 tablet (5 mg total) by mouth 2 (two) times daily.  Dispense: 60 tablet; Refill: 2  Acute bacterial conjunctivitis of right eye Assessment & Plan: rx for ofloxacin sent to pt pharmacy, take as prescribed.  Discussed how to wash eye appropriately with warm warm cloth from inner to outer canthus. Wash bed sheets and pillow cases to prevent re-infection. Frequent handwashing. If no improvement in 24-48 hours with eye drops as prescribed, please call office.    Orders: -     Ofloxacin; Place 1 drop into the right eye 4 (four) times daily for 7 days.  Dispense: 1.4 mL; Refill: 0  Hemiplegic migraine, not intractable, without status migrainosus Assessment & Plan: Ongoing, worsening.  Consideration for worsening due to stress.  Recommended pt increase water intake, goal 90 oz daily F/u with neurology as medically managed       Return in about 6 weeks (around 12/24/2023) for f/u anxiety.  Mort Sawyers, MSN, APRN, FNP-C [facility]

## 2023-11-12 NOTE — Progress Notes (Signed)
  Care Coordination   Note   11/12/2023 Name: Morgan [name] MRN: [id number] DOB: 08-Aug-1951  Morgan [name] is a 72 y.o. year old female who sees Mort Sawyers, FNP for primary care.  [name] was given information about Care Coordination services today including:   The Care Coordination services include support from the care team which includes your Nurse Coordinator, Clinical Social Worker, or Pharmacist.  The Care Coordination team is here to help remove barriers to the health concerns and goals most important to you. Care Coordination services are voluntary, and the patient may decline or stop services at any time by request to their care team member.   Care Coordination Consent Status: Patient agreed to services and verbal consent obtained.   Follow up plan:  Telephone appointment with care coordination team member scheduled for:  11/19/2023  Encounter Outcome:  Patient Scheduled from referral   Burman Nieves, [facility] Care Coordination Care Guide Direct Dial: [phone]

## 2023-11-13 NOTE — Assessment & Plan Note (Signed)
Ongoing, worsening.  Consideration for worsening due to stress.  Recommended pt increase water intake, goal 90 oz daily F/u with neurology as medically managed

## 2023-11-13 NOTE — Assessment & Plan Note (Signed)
rx for ofloxacin sent to pt pharmacy, take as prescribed.  Discussed how to wash eye appropriately with warm warm cloth from inner to outer canthus. Wash bed sheets and pillow cases to prevent re-infection. Frequent handwashing. If no improvement in 24-48 hours with eye drops as prescribed, please call office.

## 2023-11-13 NOTE — Assessment & Plan Note (Signed)
Cont sertraline 200 mg once daily Start buspirone 5 mg twice daily Placed follow up note with CCM, pt has been scheduled for next Tuesday with social worker for options available for therapy for ongoing depression anxiety.  Did d/w pt possibility of increased fatigue, drowsiness with addition of buspirone and how to minimize fall risks.

## 2023-11-19 ENCOUNTER — Ambulatory Visit: Payer: Self-pay | Admitting: *Deleted
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
## 2023-11-20 NOTE — Patient Instructions (Signed)
Visit Information  Thank you for taking time to visit with me today. Please don't hesitate to contact me if I can be of assistance to you.   Following are the goals we discussed today:   Goals Addressed             This Visit's Progress    community resource needs related to safety in the home       Activities and task to complete in order to accomplish goals.   EMOTIONAL / MENTAL HEALTH SUPPORT Self Support options  (please contact law enforcement  with any threats/concerns of harm) Needs assessment to continue on 11/22/23         Our next appointment is by telephone on 11/22/23 at 3:30pm  Please call the care guide team at [phone] if you need to cancel or reschedule your appointment.   If you are experiencing a Mental Health or Behavioral Health Crisis or need someone to talk to, please call 911   Patient verbalizes understanding of instructions and care plan provided today and agrees to view in MyChart. Active MyChart status and patient understanding of how to access instructions and care plan via MyChart confirmed with patient.     Telephone follow up appointment with care management team member scheduled for: 11/22/23  Verna Czech, LCSW [place]  [facility], [facility] Licensed Clinical Social Worker Care Coordinator  Direct Dial: [phone]

## 2023-11-25 DIAGNOSIS — J449 Chronic obstructive pulmonary disease, unspecified: Secondary | ICD-10-CM | POA: Diagnosis not present

## 2023-11-25 DIAGNOSIS — I509 Heart failure, unspecified: Secondary | ICD-10-CM | POA: Diagnosis not present

## 2023-11-27 DIAGNOSIS — J449 Chronic obstructive pulmonary disease, unspecified: Secondary | ICD-10-CM | POA: Diagnosis not present

## 2023-11-27 DIAGNOSIS — I509 Heart failure, unspecified: Secondary | ICD-10-CM | POA: Diagnosis not present

## 2023-12-04 ENCOUNTER — Other Ambulatory Visit (INDEPENDENT_AMBULATORY_CARE_PROVIDER_SITE_OTHER): Payer: Self-pay | Admitting: Vascular Surgery

## 2023-12-04 ENCOUNTER — Other Ambulatory Visit: Payer: Self-pay | Admitting: Family

## 2023-12-04 DIAGNOSIS — I701 Atherosclerosis of renal artery: Secondary | ICD-10-CM

## 2023-12-04 DIAGNOSIS — H524 Presbyopia: Secondary | ICD-10-CM | POA: Diagnosis not present

## 2023-12-04 DIAGNOSIS — I6523 Occlusion and stenosis of bilateral carotid arteries: Secondary | ICD-10-CM

## 2023-12-04 DIAGNOSIS — F4323 Adjustment disorder with mixed anxiety and depressed mood: Secondary | ICD-10-CM

## 2023-12-06 ENCOUNTER — Ambulatory Visit (INDEPENDENT_AMBULATORY_CARE_PROVIDER_SITE_OTHER): Payer: Medicare HMO | Admitting: Vascular Surgery

## 2023-12-06 ENCOUNTER — Ambulatory Visit: Payer: Self-pay | Admitting: Family

## 2023-12-06 ENCOUNTER — Ambulatory Visit
Admission: RE | Admit: 2023-12-06 | Discharge: 2023-12-06 | Disposition: A | Discharge status: Home / Self Care | Payer: Medicare HMO | Source: Ambulatory Visit | Attending: Family Medicine | Admitting: Family Medicine

## 2023-12-06 ENCOUNTER — Encounter: Payer: Self-pay | Admitting: Family Medicine

## 2023-12-06 ENCOUNTER — Ambulatory Visit (INDEPENDENT_AMBULATORY_CARE_PROVIDER_SITE_OTHER): Payer: Medicare HMO

## 2023-12-06 ENCOUNTER — Ambulatory Visit (INDEPENDENT_AMBULATORY_CARE_PROVIDER_SITE_OTHER): Payer: Medicare HMO | Admitting: Family Medicine

## 2023-12-06 VITALS — BP 154/82 | HR 73 | Temp 98.8°F | Ht 73.0 in | Wt 191.2 lb

## 2023-12-06 DIAGNOSIS — G20C Parkinsonism, unspecified: Secondary | ICD-10-CM | POA: Diagnosis not present

## 2023-12-06 DIAGNOSIS — R1030 Lower abdominal pain, unspecified: Secondary | ICD-10-CM | POA: Diagnosis not present

## 2023-12-06 DIAGNOSIS — K529 Noninfective gastroenteritis and colitis, unspecified: Secondary | ICD-10-CM

## 2023-12-06 DIAGNOSIS — K219 Gastro-esophageal reflux disease without esophagitis: Secondary | ICD-10-CM

## 2023-12-06 DIAGNOSIS — N1831 Chronic kidney disease, stage 3a: Secondary | ICD-10-CM

## 2023-12-06 DIAGNOSIS — I10 Essential (primary) hypertension: Secondary | ICD-10-CM

## 2023-12-06 DIAGNOSIS — R55 Syncope and collapse: Secondary | ICD-10-CM | POA: Diagnosis not present

## 2023-12-06 DIAGNOSIS — K625 Hemorrhage of anus and rectum: Secondary | ICD-10-CM | POA: Insufficient documentation

## 2023-12-06 DIAGNOSIS — R1032 Left lower quadrant pain: Secondary | ICD-10-CM | POA: Diagnosis not present

## 2023-12-06 DIAGNOSIS — K5730 Diverticulosis of large intestine without perforation or abscess without bleeding: Secondary | ICD-10-CM | POA: Diagnosis not present

## 2023-12-06 LAB — COMPREHENSIVE METABOLIC PANEL
ALT: 10 U/L (ref 0–35)
AST: 24 U/L (ref 0–37)
Albumin: 4.2 g/dL (ref 3.5–5.2)
Alkaline Phosphatase: 111 U/L (ref 39–117)
BUN: 22 mg/dL (ref 6–23)
CO2: 27 meq/L (ref 19–32)
Calcium: 9.3 mg/dL (ref 8.4–10.5)
Chloride: 103 meq/L (ref 96–112)
Creatinine, Ser: 1.03 mg/dL (ref 0.40–1.20)
GFR: 55.11 mL/min — ABNORMAL LOW (ref >60.00)
Glucose, Bld: 96 mg/dL (ref 70–99)
Potassium: 4.3 meq/L (ref 3.5–5.1)
Sodium: 140 meq/L (ref 135–145)
Total Bilirubin: 0.4 mg/dL (ref 0.2–1.2)
Total Protein: 7 g/dL (ref 6.0–8.3)

## 2023-12-06 LAB — POC URINALSYSI DIPSTICK (AUTOMATED)
Glucose, UA: NEGATIVE
Ketones, UA: NEGATIVE
Nitrite, UA: NEGATIVE
Protein, UA: POSITIVE — AB
Spec Grav, UA: >=1.03 — AB (ref 1.010–1.025)
Urobilinogen, UA: 0.2 U/dL
pH, UA: 5 (ref 5.0–8.0)

## 2023-12-06 LAB — CBC WITH DIFFERENTIAL/PLATELET
Basophils Absolute: 0 10*3/uL (ref 0.0–0.1)
Basophils Relative: 0.4 % (ref 0.0–3.0)
Eosinophils Absolute: 0.2 10*3/uL (ref 0.0–0.7)
Eosinophils Relative: 3.5 % (ref 0.0–5.0)
HCT: 36 % (ref 36.0–46.0)
Hemoglobin: 11.7 g/dL — ABNORMAL LOW (ref 12.0–15.0)
Lymphocytes Relative: 28.8 % (ref 12.0–46.0)
Lymphs Abs: 2 10*3/uL (ref 0.7–4.0)
MCHC: 32.5 g/dL (ref 30.0–36.0)
MCV: 83.2 fL (ref 78.0–100.0)
Monocytes Absolute: 0.3 10*3/uL (ref 0.1–1.0)
Monocytes Relative: 5 % (ref 3.0–12.0)
Neutro Abs: 4.3 10*3/uL (ref 1.4–7.7)
Neutrophils Relative %: 62.3 % (ref 43.0–77.0)
Platelets: 269 10*3/uL (ref 150.0–400.0)
RBC: 4.33 Mil/uL (ref 3.87–5.11)
RDW: 15.1 % (ref 11.5–15.5)
WBC: 6.8 10*3/uL (ref 4.0–10.5)

## 2023-12-06 LAB — LIPASE: Lipase: 5 U/L — ABNORMAL LOW (ref 11.0–59.0)

## 2023-12-06 MED ORDER — IOHEXOL 300 MG/ML  SOLN
100.0000 mL | Freq: Once | INTRAMUSCULAR | Status: AC | PRN
  Administered 2023-12-06: 100 mL via INTRAVENOUS

## 2023-12-06 MED ORDER — HYDROCORTISONE ACETATE 25 MG RE SUPP: 25.0000 mg | Freq: Two times a day (BID) | RECTAL | 0 refills | Status: DC

## 2023-12-06 NOTE — Assessment & Plan Note (Signed)
Encouraged pushing fluids with upcoming contrasted CT scan to help kidneys clear contrast.

## 2023-12-06 NOTE — Telephone Encounter (Signed)
Copied from CRM [id number]. Topic: Clinical - Red Word Triage >> Dec 06, 2023  8:31 AM Orinda Kenner C wrote: Red Word that prompted transfer to Nurse Triage: Bleeding really bad from rectal worsen this morning. Pt unsure if the abd pain from the issues. [phone]  Chief Complaint: Rectal bleeding Symptoms: Rectal bleeding Frequency: Last few days, worsened today Pertinent Negatives: Patient denies vomiting and constipation Disposition: [] ED /[] Urgent Care (no appt availability in office) / [x] Appointment(In office/virtual)/ []  [facility]/ [] Home Care/ [] Refused Recommended Disposition /[] [facility]/ []  Follow-up with PCP Additional Notes: Patient called in stating that she started bleeding from her rectum this morning. Patient noticed light spotting over the past few days, but describes that the blood flow this morning was noticeably heavier. Patient describes the blood as bright red and stated that it soaked through toilet paper 3 times. Patient stated that loose and watery bowel movements are typical for her, but she denies passing any stools as of this morning. Patient denies vomiting and constipation. Advised patient that she needed to be seen in the office within 24 hours. Scheduled an office appointment for this morning. Patient complied. Instructed patient to maintain adequate food and drink intake in the meantime. Also instructed patient to call back for worsening symptoms.   Reason for Disposition  MODERATE rectal bleeding (small blood clots, passing blood without stool, or toilet water turns red)  Answer Assessment - Initial Assessment Questions 1. APPEARANCE of BLOOD: "What color is it?" "Is it passed separately, on the surface of the stool, or mixed in with the stool?"      Bright red  2. AMOUNT: "How much blood was passed?"      "Soaked toilet paper 3 times"  3. FREQUENCY: "How many times has blood been passed with the stools?"      Spotting last couple of  days, but this morning a lot more  4. ONSET: "When was the blood first seen in the stools?" (Days or weeks)      Blood when wiping, has not passed stool as of this morning  5. DIARRHEA: "Is there also some diarrhea?" If Yes, ask: "How many diarrhea stools in the past 24 hours?"      Diarrhea everyday, but patient states this is typical for her  6. CONSTIPATION: "Do you have constipation?" If Yes, ask: "How bad is it?"     No  7. RECURRENT SYMPTOMS: "Have you had blood in your stools before?" If Yes, ask: "When was the last time?" and "What happened that time?"      No, patient states this is the first time she has had rectal bleeding   8. BLOOD THINNERS: "Do you take any blood thinners?" (e.g., Coumadin/warfarin, Pradaxa/dabigatran, aspirin)     Patient denies  9. OTHER SYMPTOMS: "Do you have any other symptoms?"  (e.g., abdomen pain, vomiting, dizziness, fever)     Patient describes baseline stomach pain is typical for her  Protocols used: Rectal Bleeding-A-AH

## 2023-12-06 NOTE — Assessment & Plan Note (Addendum)
1d h/o lower abdominal pain associated with bowel changes (diarrhea) and h/o diverticulosis. Will need to r/o diverticulitis - check labs (CBC, CMP, UA). Given abnormal abdominal exam - pain, rebound, guarding present - will check contrasted CT scan for further evaluation.  Recommend clear liquid diet to start.  Will be in touch with labs and imaging results.

## 2023-12-06 NOTE — Progress Notes (Signed)
Ph: [phone] Fax: [phone]   Patient ID: Morgan [name], female    DOB: 1951/09/18, 72 y.o.   MRN: [id number]  This visit was conducted in person.  BP (!) 154/82   Pulse 73   Temp 98.8 F (37.1 C) (Oral)   Ht 6\' 1"  (1.854 m)   Wt 191 lb 4 oz (86.8 kg)   SpO2 95%   BMI 25.23 kg/m   BP Readings from Last 3 Encounters:  12/06/23 (!) 154/82  11/12/23 136/86  10/22/23 128/88   CC: rectal bleed Subjective:   HPI: Morgan [name] is a 72 y.o. female presenting on 12/06/2023 for Rectal Bleeding (C/o seeing blood when wiping after Bms.  Started 12/04/23. H/o hemorrhoids. )   Patient of Mort Sawyers new to me with h/o CKD, IDA (s/p Venofer through heme), CHF, COPD, GERD on pantoprazole 20mg  daily, HTN, HLD, MVP, Parkinson's (EEG planned this afternoon with Dr Sherryll Burger), TIA and vascular dementia presents with:  BRBPR - light rectal spotting that started several days ago with BMs, heavier flow noted this morning after BM. Only blood with wiping, not in commode or mixed in stool. Has not noted any perianal lumps when wiping. Also developed suprapubic lower abd pain that started today - described as dull ache. Notes stools are more watery than normal. More malodorous than normal but not black tarry or pale. No formed stools.  Notes increased urinary frequency. Chronic nausea.  Bleed was just with wiping.  S/p cholecystectomy several years ago - this is when diarrhea worsened. She tried bile acid sequestrant cholestyramine BID without significant benefit.   She was treated for acute sinusitis with 7 days of augmentin antibiotic end of October at [facility].   No noted vaginal bleeding or blood in urine.  No fevers/chills, dysuria, urinary urgency.  No vomiting.  No diet changes.  No sick contacts at home.   Significant stress and concern - family member dealing with stage 4 colon cancer, one recently deceased from this colon cancer.  Yesterday she did move a lot of heavy furniture -  straining.   Chronic loose stools with stool incontinence for years wears depends due to this.  H/o chronic LUQ abdominal pain has been seen by [facility] [place])  Latest Colonoscopy 08/2023 - SSP, HP, nodular mucosa in descending colon s/p biopsy (lymphoid aggregates), diverticulosis, int hemorrhoids, rpt 1 yr ([facility] [place] [facility])  Next GI eval is 12/2023.      Relevant past medical, surgical, family and social history reviewed and updated as indicated. Interim medical history since our last visit reviewed. Allergies and medications reviewed and updated. Outpatient Medications Prior to Visit  Medication Sig Dispense Refill   acetaminophen (TYLENOL) 325 MG tablet Take 2 tablets (650 mg total) by mouth every 4 (four) hours as needed for mild pain (or temp > 37.5 C (99.5 F)).     albuterol (VENTOLIN HFA) 108 (90 Base) MCG/ACT inhaler Inhale 2 puffs into the lungs every 6 (six) hours as needed for wheezing or shortness of breath. 1 each 11   Budeson-Glycopyrrol-Formoterol (BREZTRI AEROSPHERE) 160-9-4.8 MCG/ACT AERO Inhale 2 puffs into the lungs in the morning and at bedtime. 1 each 11   busPIRone (BUSPAR) 5 MG tablet TAKE 1 TABLET BY MOUTH TWICE A DAY 180 tablet 1   carbidopa-levodopa (SINEMET IR) 25-100 MG tablet Take 2 tablets by mouth 3 (three) times daily.     cyanocobalamin (VITAMIN B12) 100 MCG tablet Take 100 mcg by mouth daily.  EPINEPHrine 0.3 mg/0.3 mL IJ SOAJ injection Inject 0.3 mg into the muscle as needed for anaphylaxis. 1 each 1   fluticasone (FLONASE) 50 MCG/ACT nasal spray Place 2 sprays into both nostrils daily. 16 g 6   folic acid (FOLVITE) 1 MG tablet TAKE 1 TABLET BY MOUTH EVERY DAY 90 tablet 1   hydrOXYzine (VISTARIL) 50 MG capsule Take 1 capsule (50 mg total) by mouth 3 (three) times daily as needed. 30 capsule 0   ibuprofen (ADVIL) 400 MG tablet Take 1 tablet (400 mg total) by mouth every 8 (eight) hours as needed for headache or moderate pain. 20 tablet 0    KLOR-CON M20 20 MEQ tablet TAKE 1 TABLET BY MOUTH EVERY DAY 90 tablet 1   LORazepam (ATIVAN) 0.5 MG tablet Take 1 tablet (0.5 mg total) by mouth at bedtime as needed for anxiety. 30 tablet 0   OXYGEN Inhale 3 L/min into the lungs See admin instructions. 3 L/min at bedtime and as needed for shortness of breath throughout the day     pantoprazole (PROTONIX) 20 MG tablet Take by mouth.     pantoprazole (PROTONIX) 20 MG tablet Take 1 tablet by mouth daily.     promethazine (PHENERGAN) 12.5 MG tablet Take 1 tablet (12.5 mg total) by mouth every 12 (twelve) hours as needed for nausea or vomiting. 20 tablet 0   sertraline (ZOLOFT) 100 MG tablet Take two tablets once daily for total 200 mg daily 180 tablet 3   topiramate (TOPAMAX) 50 MG tablet TAKE 1 TABLET BY MOUTH EVERYDAY AT BEDTIME 90 tablet 1   No facility-administered medications prior to visit.     Per HPI unless specifically indicated in ROS section below Review of Systems  Objective:  BP (!) 154/82   Pulse 73   Temp 98.8 F (37.1 C) (Oral)   Ht 6\' 1"  (1.854 m)   Wt 191 lb 4 oz (86.8 kg)   SpO2 95%   BMI 25.23 kg/m   Wt Readings from Last 3 Encounters:  12/06/23 191 lb 4 oz (86.8 kg)  11/12/23 195 lb (88.5 kg)  10/22/23 185 lb (83.9 kg)      Physical Exam Vitals and nursing note reviewed.  Constitutional:      Appearance: Normal appearance. She is not ill-appearing.  HENT:     Mouth/Throat:     Mouth: Mucous membranes are moist.     Pharynx: Oropharynx is clear. No oropharyngeal exudate or posterior oropharyngeal erythema.  Eyes:     Extraocular Movements: Extraocular movements intact.     Pupils: Pupils are equal, round, and reactive to light.  Neck:     Thyroid: No thyroid mass or thyromegaly.  Cardiovascular:     Rate and Rhythm: Normal rate and regular rhythm.     Pulses: Normal pulses.     Heart sounds: Normal heart sounds. No murmur heard. Pulmonary:     Effort: Pulmonary effort is normal. No respiratory  distress.     Breath sounds: Normal breath sounds. No wheezing, rhonchi or rales.  Abdominal:     General: Bowel sounds are normal. There is no distension.     Palpations: Abdomen is soft. There is no mass.     Tenderness: There is abdominal tenderness (mild-mod) in the right upper quadrant, right lower quadrant, epigastric area, periumbilical area, suprapubic area and left lower quadrant. There is guarding and rebound (to LLQ). Negative signs include Murphy's sign.     Hernia: No hernia is present.  Genitourinary:  Rectum: External hemorrhoid and internal hemorrhoid present. No mass or anal fissure.     Comments:  Inflamed tender anterior internal hemorrhoids with mild prolapse Musculoskeletal:     Cervical back: Normal range of motion and neck supple.     Right lower leg: No edema.     Left lower leg: No edema.  Skin:    General: Skin is warm and dry.     Findings: No rash.  Neurological:     Mental Status: She is alert.  Psychiatric:        Mood and Affect: Mood normal.        Behavior: Behavior normal.       Results for orders placed or performed in visit on 12/06/23  CBC with Differential/Platelet   Collection Time: 12/06/23 10:58 AM  Result Value Ref Range   WBC 6.8 4.0 - 10.5 K/uL   RBC 4.33 3.87 - 5.11 Mil/uL   Hemoglobin 11.7 (L) 12.0 - 15.0 g/dL   HCT 29.5 62.1 - 30.8 %   MCV 83.2 78.0 - 100.0 fl   MCHC 32.5 30.0 - 36.0 g/dL   RDW 65.7 84.6 - 96.2 %   Platelets 269.0 150.0 - 400.0 K/uL   Neutrophils Relative % 62.3 43.0 - 77.0 %   Lymphocytes Relative 28.8 12.0 - 46.0 %   Monocytes Relative 5.0 3.0 - 12.0 %   Eosinophils Relative 3.5 0.0 - 5.0 %   Basophils Relative 0.4 0.0 - 3.0 %   Neutro Abs 4.3 1.4 - 7.7 K/uL   Lymphs Abs 2.0 0.7 - 4.0 K/uL   Monocytes Absolute 0.3 0.1 - 1.0 K/uL   Eosinophils Absolute 0.2 0.0 - 0.7 K/uL   Basophils Absolute 0.0 0.0 - 0.1 K/uL  Comprehensive metabolic panel   Collection Time: 12/06/23 10:58 AM  Result Value Ref Range    Sodium 140 135 - 145 mEq/L   Potassium 4.3 3.5 - 5.1 mEq/L   Chloride 103 96 - 112 mEq/L   CO2 27 19 - 32 mEq/L   Glucose, Bld 96 70 - 99 mg/dL   BUN 22 6 - 23 mg/dL   Creatinine, Ser 9.52 0.40 - 1.20 mg/dL   Total Bilirubin 0.4 0.2 - 1.2 mg/dL   Alkaline Phosphatase 111 39 - 117 U/L   AST 24 0 - 37 U/L   ALT 10 0 - 35 U/L   Total Protein 7.0 6.0 - 8.3 g/dL   Albumin 4.2 3.5 - 5.2 g/dL   GFR 84.13 (L) >24.40 mL/min   Calcium 9.3 8.4 - 10.5 mg/dL  Lipase   Collection Time: 12/06/23 10:58 AM  Result Value Ref Range   Lipase 5.0 (L) 11.0 - 59.0 U/L  POCT Urinalysis Dipstick (Automated)   Collection Time: 12/06/23  2:38 PM  Result Value Ref Range   Color, UA yellow    Clarity, UA clear    Glucose, UA Negative Negative   Bilirubin, UA 1+    Ketones, UA negative    Spec Grav, UA >=1.030 (A) 1.010 - 1.025   Blood, UA +/-    pH, UA 5.0 5.0 - 8.0   Protein, UA Positive (A) Negative   Urobilinogen, UA 0.2 0.2 or 1.0 E.U./dL   Nitrite, UA negative    Leukocytes, UA Small (1+) (A) Negative   Assessment & Plan:   Problem List Items Addressed This Visit     CKD (chronic kidney disease) stage 3, GFR 30-59 ml/min (HCC) (Chronic)   Encouraged pushing fluids with upcoming  contrasted CT scan to help kidneys clear contrast.       Essential hypertension   BP mildly elevated in setting of abdominal pain - no changes indicated.       GERD (gastroesophageal reflux disease)   Continue daily low dose pantoprazole 20mg        Chronic diarrhea   This is followed by GI, s/p recent colonoscopy.  Last oral abx (7d augmentin) was 6+ wks ago. If persistent diarrhea, consider C diff testing.  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 Could consider retrial with titration to effect once current GI symptoms are improved.       BRBPR (bright red blood per rectum) - Primary    Anticipate hemorrhoidal - after recent furniture moving, notes blood with wiping, exam with evident mildly inflamed prolapsed anterior internal hemorrhoids.  Rx anusol suppository PRN. Update if ongoing.  Recent reassuring colonoscopy reviewed.       Relevant Orders   CBC with Differential/Platelet (Completed)   Lower abdominal pain   1d h/o lower abdominal pain associated with bowel changes (diarrhea) and h/o diverticulosis. Will need to r/o diverticulitis - check labs (CBC, CMP, UA). Given abnormal abdominal exam - pain, rebound, guarding present - will check contrasted CT scan for further evaluation.  Recommend clear liquid diet to start.  Will be in touch with labs and imaging results.       Relevant Orders   CBC with Differential/Platelet (Completed)   Comprehensive metabolic panel (Completed)   Lipase (Completed)   CT ABDOMEN PELVIS W CONTRAST   POCT Urinalysis Dipstick (Automated) (Completed)     Meds ordered this encounter  Medications   hydrocortisone (ANUSOL-HC) 25 MG suppository    Sig: Place 1 suppository (25 mg total) rectally 2 (two) times daily.    Dispense:  12 suppository    Refill:  0    Orders Placed This Encounter  Procedures   CT ABDOMEN PELVIS W CONTRAST    Standing Status:   Future    Number of Occurrences:   1    Expiration Date:   12/05/2024    If indicated for the ordered procedure, I authorize the administration of contrast media per Radiology protocol:   Yes    Does the patient have a contrast media/X-ray dye allergy?:   No    Preferred imaging location?:   [facility]    If indicated for the ordered procedure, I authorize the administration of oral contrast media per Radiology protocol:   Yes   CBC with Differential/Platelet   Comprehensive metabolic panel   Lipase   POCT Urinalysis Dipstick (Automated)    Patient Instructions  I think bleed may be from hemorrhoids.  We need to rule out diverticulitis infection given associated  abdominal pain.  Labs, urinalysis today  I would like to get contrasted CT of abdomen and pelvis - drink plenty of water after this to help kidneys clear contrast.  Start clear liquid diet for bowel rest for next 1-2 days.  We will be in touch with results of above.  If worsening symptoms, go to ER.   Follow up plan: Return if symptoms worsen or fail to improve.  Eustaquio Boyden, MD

## 2023-12-06 NOTE — Assessment & Plan Note (Signed)
Continue daily low dose pantoprazole 20mg 

## 2023-12-06 NOTE — Assessment & Plan Note (Signed)
BP mildly elevated in setting of abdominal pain - no changes indicated.

## 2023-12-06 NOTE — Assessment & Plan Note (Addendum)
Anticipate hemorrhoidal - after recent furniture moving, notes blood with wiping, exam with evident mildly inflamed prolapsed anterior internal hemorrhoids.  Rx anusol suppository PRN. Update if ongoing.  Recent reassuring colonoscopy reviewed.

## 2023-12-06 NOTE — Assessment & Plan Note (Addendum)
This is followed by GI, s/p recent colonoscopy.  Last oral abx (7d augmentin) was 6+ wks ago. If persistent diarrhea, consider C diff testing.  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 Could consider retrial with titration to effect once current GI symptoms are improved.

## 2023-12-06 NOTE — Patient Instructions (Addendum)
I think bleed may be from hemorrhoids.  We need to rule out diverticulitis infection given associated abdominal pain.  Labs, urinalysis today  I would like to get contrasted CT of abdomen and pelvis - drink plenty of water after this to help kidneys clear contrast.  Start clear liquid diet for bowel rest for next 1-2 days.  We will be in touch with results of above.  If worsening symptoms, go to ER.

## 2023-12-09 NOTE — Telephone Encounter (Signed)
Noted. Agree with recommendations.  

## 2023-12-19 DIAGNOSIS — R110 Nausea: Secondary | ICD-10-CM | POA: Diagnosis not present

## 2023-12-19 DIAGNOSIS — K580 Irritable bowel syndrome with diarrhea: Secondary | ICD-10-CM | POA: Diagnosis not present

## 2023-12-19 DIAGNOSIS — K529 Noninfective gastroenteritis and colitis, unspecified: Secondary | ICD-10-CM | POA: Diagnosis not present

## 2023-12-25 DIAGNOSIS — K529 Noninfective gastroenteritis and colitis, unspecified: Secondary | ICD-10-CM | POA: Diagnosis not present

## 2023-12-26 DIAGNOSIS — J449 Chronic obstructive pulmonary disease, unspecified: Secondary | ICD-10-CM | POA: Diagnosis not present

## 2023-12-26 DIAGNOSIS — I509 Heart failure, unspecified: Secondary | ICD-10-CM | POA: Diagnosis not present

## 2023-12-28 DIAGNOSIS — I509 Heart failure, unspecified: Secondary | ICD-10-CM | POA: Diagnosis not present

## 2023-12-28 DIAGNOSIS — J449 Chronic obstructive pulmonary disease, unspecified: Secondary | ICD-10-CM | POA: Diagnosis not present

## 2023-12-30 ENCOUNTER — Inpatient Hospital Stay: Payer: Medicare HMO | Attending: Oncology

## 2023-12-30 DIAGNOSIS — Z800 Family history of malignant neoplasm of digestive organs: Secondary | ICD-10-CM | POA: Insufficient documentation

## 2023-12-30 DIAGNOSIS — D509 Iron deficiency anemia, unspecified: Secondary | ICD-10-CM | POA: Insufficient documentation

## 2023-12-30 DIAGNOSIS — Z8041 Family history of malignant neoplasm of ovary: Secondary | ICD-10-CM | POA: Insufficient documentation

## 2023-12-30 DIAGNOSIS — N183 Chronic kidney disease, stage 3 unspecified: Secondary | ICD-10-CM | POA: Insufficient documentation

## 2023-12-30 DIAGNOSIS — Z803 Family history of malignant neoplasm of breast: Secondary | ICD-10-CM | POA: Insufficient documentation

## 2023-12-30 DIAGNOSIS — Z87891 Personal history of nicotine dependence: Secondary | ICD-10-CM | POA: Insufficient documentation

## 2023-12-31 ENCOUNTER — Inpatient Hospital Stay: Payer: Medicare HMO

## 2023-12-31 DIAGNOSIS — Z800 Family history of malignant neoplasm of digestive organs: Secondary | ICD-10-CM | POA: Diagnosis not present

## 2023-12-31 DIAGNOSIS — D508 Other iron deficiency anemias: Secondary | ICD-10-CM

## 2023-12-31 DIAGNOSIS — D509 Iron deficiency anemia, unspecified: Secondary | ICD-10-CM | POA: Diagnosis not present

## 2023-12-31 DIAGNOSIS — N183 Chronic kidney disease, stage 3 unspecified: Secondary | ICD-10-CM | POA: Diagnosis not present

## 2023-12-31 DIAGNOSIS — Z87891 Personal history of nicotine dependence: Secondary | ICD-10-CM | POA: Diagnosis not present

## 2023-12-31 DIAGNOSIS — Z803 Family history of malignant neoplasm of breast: Secondary | ICD-10-CM | POA: Diagnosis not present

## 2023-12-31 DIAGNOSIS — Z8041 Family history of malignant neoplasm of ovary: Secondary | ICD-10-CM | POA: Diagnosis not present

## 2023-12-31 LAB — CMP (CANCER CENTER ONLY)
ALT: 11 U/L (ref 0–44)
AST: 16 U/L (ref 15–41)
Albumin: 3.9 g/dL (ref 3.5–5.0)
Alkaline Phosphatase: 89 U/L (ref 38–126)
Anion gap: 9 (ref 5–15)
BUN: 33 mg/dL — ABNORMAL HIGH (ref 8–23)
CO2: 23 mmol/L (ref 22–32)
Calcium: 8.9 mg/dL (ref 8.9–10.3)
Chloride: 107 mmol/L (ref 98–111)
Creatinine: 1.17 mg/dL — ABNORMAL HIGH (ref 0.44–1.00)
GFR, Estimated: 50 mL/min — ABNORMAL LOW (ref >60)
Glucose, Bld: 121 mg/dL — ABNORMAL HIGH (ref 70–99)
Potassium: 4.6 mmol/L (ref 3.5–5.1)
Sodium: 139 mmol/L (ref 135–145)
Total Bilirubin: 0.5 mg/dL (ref 0.0–1.2)
Total Protein: 6.9 g/dL (ref 6.5–8.1)

## 2023-12-31 LAB — CBC WITH DIFFERENTIAL (CANCER CENTER ONLY)
Abs Immature Granulocytes: 0.02 10*3/uL (ref 0.00–0.07)
Basophils Absolute: 0 10*3/uL (ref 0.0–0.1)
Basophils Relative: 1 %
Eosinophils Absolute: 0.2 10*3/uL (ref 0.0–0.5)
Eosinophils Relative: 3 %
HCT: 36.5 % (ref 36.0–46.0)
Hemoglobin: 11.3 g/dL — ABNORMAL LOW (ref 12.0–15.0)
Immature Granulocytes: 0 %
Lymphocytes Relative: 33 %
Lymphs Abs: 2 10*3/uL (ref 0.7–4.0)
MCH: 26.5 pg (ref 26.0–34.0)
MCHC: 31 g/dL (ref 30.0–36.0)
MCV: 85.5 fL (ref 80.0–100.0)
Monocytes Absolute: 0.3 10*3/uL (ref 0.1–1.0)
Monocytes Relative: 4 %
Neutro Abs: 3.6 10*3/uL (ref 1.7–7.7)
Neutrophils Relative %: 59 %
Platelet Count: 227 10*3/uL (ref 150–400)
RBC: 4.27 MIL/uL (ref 3.87–5.11)
RDW: 14.6 % (ref 11.5–15.5)
WBC Count: 6.1 10*3/uL (ref 4.0–10.5)
nRBC: 0 % (ref 0.0–0.2)

## 2023-12-31 LAB — IRON AND TIBC
Iron: 63 ug/dL (ref 28–170)
Saturation Ratios: 21 % (ref 10.4–31.8)
TIBC: 308 ug/dL (ref 250–450)
UIBC: 245 ug/dL

## 2023-12-31 LAB — RETIC PANEL
Immature Retic Fract: 3.3 % (ref 2.3–15.9)
RBC.: 4.26 MIL/uL (ref 3.87–5.11)
Retic Count, Absolute: 32.4 10*3/uL (ref 19.0–186.0)
Retic Ct Pct: 0.8 % (ref 0.4–3.1)
Reticulocyte Hemoglobin: 29.9 pg (ref >27.9)

## 2023-12-31 LAB — FERRITIN: Ferritin: 237 ng/mL (ref 11–307)

## 2024-01-01 ENCOUNTER — Inpatient Hospital Stay: Payer: Medicare HMO | Admitting: Oncology

## 2024-01-01 ENCOUNTER — Encounter: Payer: Self-pay | Admitting: Oncology

## 2024-01-01 ENCOUNTER — Inpatient Hospital Stay: Payer: Medicare HMO

## 2024-01-01 VITALS — BP 145/83 | HR 67 | Temp 97.0°F | Resp 18 | Wt 191.4 lb

## 2024-01-01 DIAGNOSIS — Z87891 Personal history of nicotine dependence: Secondary | ICD-10-CM | POA: Diagnosis not present

## 2024-01-01 DIAGNOSIS — N1831 Chronic kidney disease, stage 3a: Secondary | ICD-10-CM

## 2024-01-01 DIAGNOSIS — Z8041 Family history of malignant neoplasm of ovary: Secondary | ICD-10-CM | POA: Diagnosis not present

## 2024-01-01 DIAGNOSIS — Z803 Family history of malignant neoplasm of breast: Secondary | ICD-10-CM | POA: Diagnosis not present

## 2024-01-01 DIAGNOSIS — D508 Other iron deficiency anemias: Secondary | ICD-10-CM

## 2024-01-01 DIAGNOSIS — Z800 Family history of malignant neoplasm of digestive organs: Secondary | ICD-10-CM | POA: Diagnosis not present

## 2024-01-01 DIAGNOSIS — D509 Iron deficiency anemia, unspecified: Secondary | ICD-10-CM | POA: Diagnosis not present

## 2024-01-01 DIAGNOSIS — N183 Chronic kidney disease, stage 3 unspecified: Secondary | ICD-10-CM | POA: Diagnosis not present

## 2024-01-01 NOTE — Progress Notes (Signed)
 Hematology/Oncology Progress note [phone] [phone]      Patient Care Team: Felicita Horns, FNP as PCP - General (Family Medicine) Jonathan Neighbor, [facility] (Inactive) as Pharmacist (Pharmacist) Timmy Forbes, MD as Consulting Physician (Hematology and Oncology) Rosan Comfort, MD as Consulting Physician (Neurology) Ave Leisure, LCSW as Social Worker  CHIEF COMPLAINTS/REASON FOR VISIT:  Follow-up for iron  deficiency anemia, CKD.  ASSESSMENT & PLAN:   IDA (iron  deficiency anemia) Iron  deficiency anemia in the context of chronic kidney disease.  Labs are reviewed and discussed with patient. Lab Results  Component Value Date   HGB 11.3 (L) 12/31/2023   TIBC 308 12/31/2023   IRONPCTSAT 21 12/31/2023   FERRITIN 237 12/31/2023   No need for IV Venofer .     CKD (chronic kidney disease) stage 3, GFR 30-59 ml/min (HCC) Encourage oral hydration and avoid nephrotoxins.     Orders Placed This Encounter  Procedures   CBC with Differential (Cancer Center Only)    Standing Status:   Future    Expected Date:   06/30/2024    Expiration Date:   12/31/2024   CMP (Cancer Center only)    Standing Status:   Future    Expected Date:   06/30/2024    Expiration Date:   12/31/2024   Ferritin    Standing Status:   Future    Expected Date:   06/30/2024    Expiration Date:   12/31/2024   Iron  and TIBC    Standing Status:   Future    Expected Date:   06/30/2024    Expiration Date:   12/31/2024   Retic Panel    Standing Status:   Future    Expected Date:   06/30/2024    Expiration Date:   12/31/2024   Follow up in 6 months All questions were answered. The patient knows to call the clinic with any problems, questions or concerns.  Timmy Forbes, MD, PhD [facility] Hematology Oncology 01/01/2024   HISTORY OF PRESENTING ILLNESS:   Morgan [name] is a  73 y.o.  female with PMH listed below was seen in consultation at the request of  Felicita Horns, FNP  for evaluation of  anemia Patient had blood work done recently on 08/29/2021. CBC showed a hemoglobin of 9.9, MCV 75.5. Patient denies any black stool or blood in the stool. She has a history of gastric stenosis for which she got EGD dilation periodically.  Last EGD was 08/11/2020. Patient reports feeling tired and fatigued. She has taken oral iron  supplementation before.  Iron  has made her constipation worse.  INTERVAL HISTORY Morgan [name] is a 73 y.o. female who has above history reviewed by me today presents for follow up visit for management of iron  deficiency anemia and CKD.  Patient feels well at baseline.  Denies any bleeding events. Colonoscopy was done on 08/20/23 results were reviewed.    Review of Systems  Constitutional:  Positive for fatigue. Negative for appetite change, chills and fever.  HENT:   Negative for hearing loss and voice change.   Eyes:  Negative for eye problems.  Respiratory:  Negative for chest tightness and cough.   Cardiovascular:  Negative for chest pain.  Gastrointestinal:  Negative for abdominal distention, abdominal pain and blood in stool.  Endocrine: Negative for hot flashes.  Genitourinary:  Negative for difficulty urinating and frequency.   Musculoskeletal:  Negative for arthralgias.  Skin:  Negative for itching and rash.  Neurological:  Negative for extremity  weakness.  Hematological:  Negative for adenopathy.  Psychiatric/Behavioral:  Negative for confusion.     MEDICAL HISTORY:  Past Medical History:  Diagnosis Date   Allergy    Anemia    Anxiety    Arthritis    CHF (congestive heart failure) (HCC)    Chronic kidney disease    COPD (chronic obstructive pulmonary disease) (HCC)    Depression    GERD (gastroesophageal reflux disease)    Headache    Hyperlipidemia    Hypertension    MVP (mitral valve prolapse)    Parkinson disease (HCC)    Vascular dementia (HCC)     SURGICAL HISTORY: Past Surgical History:  Procedure Laterality Date   ABDOMINAL  HYSTERECTOMY     APPENDECTOMY     BALLOON DILATION N/A 04/21/2020   Procedure: BALLOON DILATION;  Surgeon: Janel Medford, MD;  Location: [facility];  Service: Endoscopy;  Laterality: N/A;  pyloric/ GI   BALLOON DILATION N/A 07/04/2020   Procedure: BALLOON DILATION;  Surgeon: Sergio Dandy, MD;  Location: [facility];  Service: Endoscopy;  Laterality: N/A;   BIOPSY  04/21/2020   Procedure: BIOPSY;  Surgeon: Janel Medford, MD;  Location: [facility];  Service: Endoscopy;;   BIOPSY  07/04/2020   Procedure: BIOPSY;  Surgeon: Sergio Dandy, MD;  Location: [facility];  Service: Endoscopy;;  EGD and COLON   BREAST SURGERY Right 1988   tumor removal   CHOLECYSTECTOMY N/A 05/18/2020   Procedure: LAPAROSCOPIC CHOLECYSTECTOMY;  Surgeon: Jacolyn Matar, MD;  Location: [facility];  Service: General;  Laterality: N/A;   COLONOSCOPY WITH PROPOFOL  N/A 07/04/2020   Procedure: COLONOSCOPY WITH PROPOFOL ;  Surgeon: Sergio Dandy, MD;  Location: [facility];  Service: Endoscopy;  Laterality: N/A;   COLONOSCOPY WITH PROPOFOL  N/A 11/03/2021   Procedure: COLONOSCOPY WITH PROPOFOL ;  Surgeon: Shane Darling, MD;  Location: [facility];  Service: Endoscopy;  Laterality: N/A;   COLONOSCOPY WITH PROPOFOL  N/A 08/20/2023   Procedure: COLONOSCOPY WITH PROPOFOL ;  Surgeon: Shane Darling, MD;  Location: [facility];  Service: Endoscopy;  Laterality: N/A;   ESOPHAGEAL DILATION  08/11/2020   Procedure: PYLORIC DILATION;  Surgeon: Albertina Hugger, MD;  Location: [facility];  Service: Gastroenterology;;   ESOPHAGOGASTRODUODENOSCOPY (EGD) WITH PROPOFOL  N/A 04/21/2020   Procedure: ESOPHAGOGASTRODUODENOSCOPY (EGD) WITH PROPOFOL ;  Surgeon: Janel Medford, MD;  Location: [facility];  Service: Endoscopy;  Laterality: N/A;   ESOPHAGOGASTRODUODENOSCOPY (EGD) WITH PROPOFOL  N/A 07/04/2020   Procedure: ESOPHAGOGASTRODUODENOSCOPY (EGD) WITH PROPOFOL ;  Surgeon: Sergio Dandy, MD;  Location: [facility];  Service: Endoscopy;  Laterality: N/A;   ESOPHAGOGASTRODUODENOSCOPY (EGD) WITH PROPOFOL  N/A 08/11/2020   Procedure: ESOPHAGOGASTRODUODENOSCOPY (EGD) WITH PROPOFOL ;  Surgeon: Albertina Hugger, MD;  [facility]: [facility];  Service: Gastroenterology;  Laterality: N/A;   ESOPHAGOGASTRODUODENOSCOPY (EGD) WITH PROPOFOL  N/A 11/03/2021   Procedure: ESOPHAGOGASTRODUODENOSCOPY (EGD) WITH PROPOFOL ;  Surgeon: Shane Darling, MD;  Location: [facility];  Service: Endoscopy;  Laterality: N/A;   ESOPHAGOGASTRODUODENOSCOPY (EGD) WITH PROPOFOL  N/A 05/07/2022   Procedure: ESOPHAGOGASTRODUODENOSCOPY (EGD) WITH PROPOFOL ;  Surgeon: Selena Daily, MD;  Location: [facility];  Service: Gastroenterology;  Laterality: N/A;   HEMOSTASIS CLIP PLACEMENT  08/20/2023   Procedure: HEMOSTASIS CLIP PLACEMENT;  Surgeon: Shane Darling, MD;  Location: [facility];  Service: Endoscopy;;   KNEE SURGERY  2005   2 times left   KNEE SURGERY Right    KYPHOPLASTY N/A 07/20/2021   Procedure: T 10KYPHOPLASTY;  Surgeon: Molli Angelucci, MD;  Location:  [facility];  Service: Orthopedics;  Laterality: N/A;   KYPHOPLASTY N/A 08/23/2021   Procedure: T9 KYPHOPLASTY;  Surgeon: Molli Angelucci, MD;  Location: [facility];  Service: Orthopedics;  Laterality: N/A;   POLYPECTOMY  07/04/2020   Procedure: POLYPECTOMY;  Surgeon: Sergio Dandy, MD;  Location: [facility];  Service: Endoscopy;;   POLYPECTOMY  08/20/2023   Procedure: POLYPECTOMY;  Surgeon: Shane Darling, MD;  Location: [facility];  Service: Endoscopy;;   SUBMUCOSAL TATTOO INJECTION  08/20/2023   Procedure: SUBMUCOSAL TATTOO INJECTION;  Surgeon: Shane Darling, MD;  Location: [facility];  Service: Endoscopy;;   TUBAL LIGATION      SOCIAL HISTORY: Social History   Socioeconomic History   Marital status: Single    Spouse name: Not on file   Number of children: 2   Years of education: Not on file   Highest education level: Not on file   Occupational History   Not on file  Tobacco Use   Smoking status: Former    Current packs/day: 0.00    Average packs/day: 1 pack/day for 15.0 years (15.0 ttl pk-yrs)    Types: Cigarettes    Start date: 09/12/2002    Quit date: 09/12/2017    Years since quitting: 6.3   Smokeless tobacco: Never  Vaping Use   Vaping status: Never Used  Substance and Sexual Activity   Alcohol  use: Never   Drug use: No   Sexual activity: Not Currently  Other Topics Concern   Not on file  Social History Narrative   Lives with Grandson's great grandparents    Grandson - 3 years old ([place])   Daughter - Rosalia Colonel - [place]   Support system - family, aunt, Haskell Linker and Josefina Nian (who she lives with), brother   Transportation: roommates   Enjoy: [employer], playing with grandson   Exercise: PT exercises   Diet: good - chicken, some steak, tries to eat healthy, avoids fried foods   Retired from being a Lawyer   Social Drivers of Corporate investment banker Strain: Low Risk  (10/15/2023)   Overall Financial Resource Strain (CARDIA)    Difficulty of Paying Living Expenses: Not hard at all  Food Insecurity: No Food Insecurity (11/19/2023)   Hunger Vital Sign    Worried About Running Out of Food in the Last Year: Never true    Ran Out of Food in the Last Year: Never true  Transportation Needs: No Transportation Needs (11/19/2023)   PRAPARE - Administrator, Civil Service (Medical): No    Lack of Transportation (Non-Medical): No  Physical Activity: Insufficiently Active (10/15/2023)   Exercise Vital Sign    Days of Exercise per Week: 3 days    Minutes of Exercise per Session: 30 min  Stress: No Stress Concern Present (10/15/2023)   [employer] of Occupational Health - Occupational Stress Questionnaire    Feeling of Stress : Not at all  Social Connections: Moderately Isolated (11/19/2023)   Social Connection and Isolation Panel [NHANES]    Frequency of Communication with  Friends and Family: More than three times a week    Frequency of Social Gatherings with Friends and Family: More than three times a week    Attends Religious Services: More than 4 times per year    Active Member of [employer] or Organizations: No    Attends Banker Meetings: Never    Marital Status: Divorced  Catering manager Violence: Not At Risk (11/19/2023)   Humiliation,  Afraid, Rape, and Kick questionnaire    Fear of Current or Ex-Partner: No    Emotionally Abused: No    Physically Abused: No    Sexually Abused: No    FAMILY HISTORY: Family History  Problem Relation Age of Onset   Breast cancer Mother 37   Ovarian cancer Mother 54   Brain cancer Mother    Hypertension Father    Hyperlipidemia Father    Heart disease Father    Colon cancer Father        diagnosed in his 95s   Breast cancer Maternal Grandmother 62   Diabetes Paternal Grandmother    Breast cancer Paternal Grandmother 75   Other Son        drug over dose   Colon cancer Maternal Grandfather        dx in his 61s   Liver disease Maternal Grandfather    Breast cancer Maternal Aunt 60   Breast cancer Maternal Aunt 60   Colon cancer Maternal Uncle    Stomach cancer Neg Hx    Pancreatic cancer Neg Hx    Esophageal cancer Neg Hx     ALLERGIES:  is allergic to angiotensin receptor blockers, meperidine, strawberry extract, sucralfate , and wasp venom.  MEDICATIONS:  Current Outpatient Medications  Medication Sig Dispense Refill   acetaminophen  (TYLENOL ) 325 MG tablet Take 2 tablets (650 mg total) by mouth every 4 (four) hours as needed for mild pain (or temp > 37.5 C (99.5 F)).     albuterol  (VENTOLIN  HFA) 108 (90 Base) MCG/ACT inhaler Inhale 2 puffs into the lungs every 6 (six) hours as needed for wheezing or shortness of breath. 1 each 11   Budeson-Glycopyrrol-Formoterol  (BREZTRI  AEROSPHERE) 160-9-4.8 MCG/ACT AERO Inhale 2 puffs into the lungs in the morning and at bedtime. 1 each 11   busPIRone   (BUSPAR ) 5 MG tablet TAKE 1 TABLET BY MOUTH TWICE A DAY 180 tablet 1   carbidopa -levodopa  (SINEMET  IR) 25-100 MG tablet Take 2 tablets by mouth 3 (three) times daily.     cyanocobalamin  (VITAMIN B12) 100 MCG tablet Take 100 mcg by mouth daily.     fluticasone  (FLONASE ) 50 MCG/ACT nasal spray Place 2 sprays into both nostrils daily. 16 g 6   folic acid  (FOLVITE ) 1 MG tablet TAKE 1 TABLET BY MOUTH EVERY DAY 90 tablet 1   hydrOXYzine  (VISTARIL ) 50 MG capsule Take 1 capsule (50 mg total) by mouth 3 (three) times daily as needed. 30 capsule 0   ibuprofen  (ADVIL ) 400 MG tablet Take 1 tablet (400 mg total) by mouth every 8 (eight) hours as needed for headache or moderate pain. 20 tablet 0   KLOR-CON  M20 20 MEQ tablet TAKE 1 TABLET BY MOUTH EVERY DAY 90 tablet 1   LORazepam  (ATIVAN ) 0.5 MG tablet Take 1 tablet (0.5 mg total) by mouth at bedtime as needed for anxiety. 30 tablet 0   OXYGEN  Inhale 3 L/min into the lungs See admin instructions. 3 L/min at bedtime and as needed for shortness of breath throughout the day     pantoprazole  (PROTONIX ) 20 MG tablet Take by mouth.     promethazine  (PHENERGAN ) 12.5 MG tablet Take 1 tablet (12.5 mg total) by mouth every 12 (twelve) hours as needed for nausea or vomiting. 20 tablet 0   sertraline  (ZOLOFT ) 100 MG tablet Take two tablets once daily for total 200 mg daily 180 tablet 3   topiramate  (TOPAMAX ) 50 MG tablet TAKE 1 TABLET BY MOUTH EVERYDAY AT BEDTIME 90 tablet  1   EPINEPHrine  0.3 mg/0.3 mL IJ SOAJ injection Inject 0.3 mg into the muscle as needed for anaphylaxis. (Patient not taking: Reported on 01/01/2024) 1 each 1   hydrocortisone  (ANUSOL -HC) 25 MG suppository Place 1 suppository (25 mg total) rectally 2 (two) times daily. (Patient not taking: Reported on 01/01/2024) 12 suppository 0   No current facility-administered medications for this visit.     PHYSICAL EXAMINATION: ECOG PERFORMANCE STATUS: 1 - Symptomatic but completely ambulatory Vitals:   01/01/24  1345  BP: (!) 145/83  Pulse: 67  Resp: 18  Temp: (!) 97 F (36.1 C)   Filed Weights   01/01/24 1345  Weight: 191 lb 6.4 oz (86.8 kg)    Physical Exam Constitutional:      General: She is not in acute distress. HENT:     Head: Normocephalic and atraumatic.  Eyes:     General: No scleral icterus. Cardiovascular:     Rate and Rhythm: Normal rate and regular rhythm.     Heart sounds: Normal heart sounds.  Pulmonary:     Effort: Pulmonary effort is normal. No respiratory distress.     Breath sounds: No wheezing.  Abdominal:     General: Bowel sounds are normal. There is no distension.     Palpations: Abdomen is soft.  Musculoskeletal:        General: No deformity. Normal range of motion.     Cervical back: Normal range of motion and neck supple.  Skin:    General: Skin is warm and dry.     Findings: No erythema or rash.  Neurological:     Mental Status: She is alert and oriented to person, place, and time. Mental status is at baseline.     Cranial Nerves: No cranial nerve deficit.     Coordination: Coordination normal.  Psychiatric:        Mood and Affect: Mood normal.     LABORATORY DATA:  I have reviewed the data as listed     Latest Ref Rng & Units 12/31/2023    1:31 PM 12/06/2023   10:58 AM 08/12/2023    1:39 PM  CBC  WBC 4.0 - 10.5 K/uL 6.1  6.8  6.3   Hemoglobin 12.0 - 15.0 g/dL 16.1  09.6  04.5   Hematocrit 36.0 - 46.0 % 36.5  36.0  32.8   Platelets 150 - 400 K/uL 227  269.0  212       Latest Ref Rng & Units 12/31/2023    1:31 PM 12/06/2023   10:58 AM 08/12/2023    1:39 PM  CMP  Glucose 70 - 99 mg/dL 409  96  97   BUN 8 - 23 mg/dL 33  22  27   Creatinine 0.44 - 1.00 mg/dL 8.11  9.14  7.82   Sodium 135 - 145 mmol/L 139  140  138   Potassium 3.5 - 5.1 mmol/L 4.6  4.3  4.1   Chloride 98 - 111 mmol/L 107  103  105   CO2 22 - 32 mmol/L 23  27  25    Calcium  8.9 - 10.3 mg/dL 8.9  9.3  8.8   Total Protein 6.5 - 8.1 g/dL 6.9  7.0    Total Bilirubin 0.0 -  1.2 mg/dL 0.5  0.4    Alkaline Phos 38 - 126 U/L 89  111    AST 15 - 41 U/L 16  24    ALT 0 - 44 U/L 11  10  Iron /TIBC/Ferritin/ %Sat    Component Value Date/Time   IRON  63 12/31/2023 1331   TIBC 308 12/31/2023 1331   FERRITIN 237 12/31/2023 1331   IRONPCTSAT 21 12/31/2023 1331       RADIOGRAPHIC STUDIES: I have personally reviewed the radiological images as listed and agreed with the findings in the report. CT ABDOMEN PELVIS W CONTRAST Result Date: 12/06/2023 CLINICAL DATA:  Left lower quadrant abdominal pain.  Lower GI bleed. EXAM: CT ABDOMEN AND PELVIS WITH CONTRAST TECHNIQUE: Multidetector CT imaging of the abdomen and pelvis was performed using the standard protocol following bolus administration of intravenous contrast. RADIATION DOSE REDUCTION: This exam was performed according to the departmental dose-optimization program which includes automated exposure control, adjustment of the mA and/or kV according to patient size and/or use of iterative reconstruction technique. CONTRAST:  100mL OMNIPAQUE  IOHEXOL  300 MG/ML  SOLN COMPARISON:  CT of the abdomen pelvis dated 11/07/2022. FINDINGS: Lower chest: The visualized lung bases are clear. No intra-abdominal free air or free fluid. Hepatobiliary: The liver is unremarkable. There is mild biliary dilatation, post cholecystectomy. No retained calcified stone noted in the central CBD. Pancreas: Unremarkable. No pancreatic ductal dilatation or surrounding inflammatory changes. Spleen: Normal in size without focal abnormality. Adrenals/Urinary Tract: The adrenal glands are unremarkable. There is no hydronephrosis on either side. There is symmetric enhancement and excretion of contrast by both kidneys. The visualized ureters and the urinary bladder unremarkable. Stomach/Bowel: There is sigmoid diverticulosis without active inflammatory changes. There is no bowel obstruction or active inflammation. The appendix is not visualized with certainty. No  inflammatory changes identified in the right lower quadrant. Vascular/Lymphatic: Moderate aortoiliac atherosclerotic disease. The IVC is unremarkable. No portal venous gas. There is no adenopathy. Reproductive: Hysterectomy.  No adnexal masses. Other: None Musculoskeletal: Osteopenia. Lower thoracic vertebroplasty. No acute osseous pathology. IMPRESSION: 1. No acute intra-abdominal or pelvic pathology. 2. Sigmoid diverticulosis. No bowel obstruction. 3.  Aortic Atherosclerosis (ICD10-I70.0). Electronically Signed   By: Angus Bark M.D.   On: 12/06/2023 18:25

## 2024-01-01 NOTE — Assessment & Plan Note (Addendum)
 Iron  deficiency anemia in the context of chronic kidney disease.  Labs are reviewed and discussed with patient. Lab Results  Component Value Date   HGB 11.3 (L) 12/31/2023   TIBC 308 12/31/2023   IRONPCTSAT 21 12/31/2023   FERRITIN 237 12/31/2023   No need for IV Venofer .

## 2024-01-01 NOTE — Assessment & Plan Note (Signed)
 Encourage oral hydration and avoid nephrotoxins.

## 2024-01-07 ENCOUNTER — Encounter: Payer: Self-pay | Admitting: Family

## 2024-01-07 ENCOUNTER — Ambulatory Visit (INDEPENDENT_AMBULATORY_CARE_PROVIDER_SITE_OTHER): Payer: Medicare HMO | Admitting: Family

## 2024-01-07 ENCOUNTER — Ambulatory Visit (INDEPENDENT_AMBULATORY_CARE_PROVIDER_SITE_OTHER)
Admission: RE | Admit: 2024-01-07 | Discharge: 2024-01-07 | Disposition: A | Discharge status: Home / Self Care | Payer: Medicare HMO | Source: Ambulatory Visit | Attending: Family

## 2024-01-07 VITALS — BP 132/86 | HR 78 | Temp 98.0°F | Ht 73.0 in | Wt 192.4 lb

## 2024-01-07 DIAGNOSIS — J029 Acute pharyngitis, unspecified: Secondary | ICD-10-CM

## 2024-01-07 DIAGNOSIS — R0602 Shortness of breath: Secondary | ICD-10-CM | POA: Diagnosis not present

## 2024-01-07 DIAGNOSIS — Z20822 Contact with and (suspected) exposure to covid-19: Secondary | ICD-10-CM | POA: Diagnosis not present

## 2024-01-07 DIAGNOSIS — J22 Unspecified acute lower respiratory infection: Secondary | ICD-10-CM | POA: Insufficient documentation

## 2024-01-07 DIAGNOSIS — J42 Unspecified chronic bronchitis: Secondary | ICD-10-CM | POA: Diagnosis not present

## 2024-01-07 DIAGNOSIS — R0989 Other specified symptoms and signs involving the circulatory and respiratory systems: Secondary | ICD-10-CM

## 2024-01-07 DIAGNOSIS — R34 Anuria and oliguria: Secondary | ICD-10-CM

## 2024-01-07 DIAGNOSIS — B9689 Other specified bacterial agents as the cause of diseases classified elsewhere: Secondary | ICD-10-CM | POA: Diagnosis not present

## 2024-01-07 LAB — POCT RAPID STREP A (OFFICE): Rapid Strep A Screen: NEGATIVE

## 2024-01-07 LAB — POC COVID19 BINAXNOW: SARS Coronavirus 2 Ag: NEGATIVE

## 2024-01-07 MED ORDER — PREDNISONE 20 MG PO TABS: ORAL_TABLET | ORAL | 0 refills | Status: DC

## 2024-01-07 MED ORDER — CEFTRIAXONE SODIUM 1 G IJ SOLR
1.0000 g | Freq: Once | INTRAMUSCULAR | Status: AC
  Administered 2024-01-07: 1 g via INTRAMUSCULAR

## 2024-01-07 NOTE — Assessment & Plan Note (Signed)
Suspected pneumonia  Stat cxr pending results.  1 g rocephin in office Consider augmentin /doxy addition pending results if positive  Prednisone given for sob, copd exacerbation  Advised patient on supportive measures:  Be sure to rest, drink plenty of fluids, and use tylenol  as needed for pain. Follow up if fever >101, if symptoms worsen or if symptoms are not improved in 3 days. Patient verbalizes understanding.

## 2024-01-07 NOTE — Progress Notes (Signed)
Established Patient Office Visit  Subjective:   Patient ID: Morgan [name], female    DOB: [birth year]  Age: 73 y.o. MRN: [id number]  CC:  Chief Complaint  Patient presents with   URI    HPI: Morgan [name] is a 73 y.o. female presenting on 01/07/2024 for URI  Woke up yesterday am with runny nose, nasal congestion, ears stopped up on right side with pain. Slight sore throat, but thinks from coughing. Does have a cough, just recently which is dry. No chest congestion. No fever. Some body aches all over.   Otc meds tylenol  Has not been able to pee for a few days has had some urinary urgency. She is peeing a little.   She is having some sob        ROS: Negative unless specifically indicated above in HPI.   Relevant past medical history reviewed and updated as indicated.   Allergies and medications reviewed and updated.   Current Outpatient Medications:    acetaminophen (TYLENOL) 325 MG tablet, Take 2 tablets (650 mg total) by mouth every 4 (four) hours as needed for mild pain (or temp > 37.5 C (99.5 F))., Disp: , Rfl:    albuterol (VENTOLIN HFA) 108 (90 Base) MCG/ACT inhaler, Inhale 2 puffs into the lungs every 6 (six) hours as needed for wheezing or shortness of breath., Disp: 1 each, Rfl: 11   Budeson-Glycopyrrol-Formoterol (BREZTRI AEROSPHERE) 160-9-4.8 MCG/ACT AERO, Inhale 2 puffs into the lungs in the morning and at bedtime., Disp: 1 each, Rfl: 11   busPIRone (BUSPAR) 5 MG tablet, TAKE 1 TABLET BY MOUTH TWICE A DAY, Disp: 180 tablet, Rfl: 1   carbidopa-levodopa (SINEMET IR) 25-100 MG tablet, Take 2 tablets by mouth 3 (three) times daily., Disp: , Rfl:    cyanocobalamin (VITAMIN B12) 100 MCG tablet, Take 100 mcg by mouth daily., Disp: , Rfl:    EPINEPHrine 0.3 mg/0.3 mL IJ SOAJ injection, Inject 0.3 mg into the muscle as needed for anaphylaxis., Disp: 1 each, Rfl: 1   fluticasone (FLONASE) 50 MCG/ACT nasal spray, Place 2 sprays into both nostrils daily., Disp: 16 g, Rfl: 6    folic acid (FOLVITE) 1 MG tablet, TAKE 1 TABLET BY MOUTH EVERY DAY, Disp: 90 tablet, Rfl: 1   hydrocortisone (ANUSOL-HC) 25 MG suppository, Place 1 suppository (25 mg total) rectally 2 (two) times daily., Disp: 12 suppository, Rfl: 0   hydrOXYzine (VISTARIL) 50 MG capsule, Take 1 capsule (50 mg total) by mouth 3 (three) times daily as needed., Disp: 30 capsule, Rfl: 0   ibuprofen (ADVIL) 400 MG tablet, Take 1 tablet (400 mg total) by mouth every 8 (eight) hours as needed for headache or moderate pain., Disp: 20 tablet, Rfl: 0   KLOR-CON M20 20 MEQ tablet, TAKE 1 TABLET BY MOUTH EVERY DAY, Disp: 90 tablet, Rfl: 1   LORazepam (ATIVAN) 0.5 MG tablet, Take 1 tablet (0.5 mg total) by mouth at bedtime as needed for anxiety., Disp: 30 tablet, Rfl: 0   OXYGEN, Inhale 3 L/min into the lungs See admin instructions. 3 L/min at bedtime and as needed for shortness of breath throughout the day, Disp: , Rfl:    predniSONE (DELTASONE) 20 MG tablet, Take two tablets once daily for five days, Disp: 30 tablet, Rfl: 0   promethazine (PHENERGAN) 12.5 MG tablet, Take 1 tablet (12.5 mg total) by mouth every 12 (twelve) hours as needed for nausea or vomiting., Disp: 20 tablet, Rfl: 0   sertraline (ZOLOFT) 100 MG  tablet, Take two tablets once daily for total 200 mg daily, Disp: 180 tablet, Rfl: 3   topiramate (TOPAMAX) 50 MG tablet, TAKE 1 TABLET BY MOUTH EVERYDAY AT BEDTIME, Disp: 90 tablet, Rfl: 1   pantoprazole (PROTONIX) 20 MG tablet, Take by mouth., Disp: , Rfl:   Current Facility-Administered Medications:    cefTRIAXone (ROCEPHIN) injection 1 g, 1 g, Intramuscular, Once,   Allergies  Allergen Reactions   Angiotensin Receptor Blockers Swelling    Angioedema   Meperidine Hives and Anaphylaxis   Strawberry Extract Hives and Swelling   Sucralfate Nausea Only and Other (See Comments)    Severe nausea Other reaction(s): Other (See Comments) Severe nausea Severe nausea   Wasp Venom Hives    Objective:   BP  132/86 (BP Location: Left Arm, Patient Position: Sitting, Cuff Size: Normal)   Pulse 78   Temp 98 F (36.7 C) (Temporal)   Ht 6\' 1"  (1.854 m)   Wt 192 lb 6.4 oz (87.3 kg)   SpO2 94%   BMI 25.38 kg/m    Physical Exam Constitutional:      General: She is not in acute distress.    Appearance: Normal appearance. She is normal weight. She is not ill-appearing, toxic-appearing or diaphoretic.  HENT:     Head: Normocephalic.     Right Ear: A middle ear effusion (clear) is present.     Left Ear: Tympanic membrane normal.     Nose: Nose normal.     Mouth/Throat:     Mouth: Mucous membranes are dry.     Pharynx: No oropharyngeal exudate or posterior oropharyngeal erythema.  Eyes:     Extraocular Movements: Extraocular movements intact.     Pupils: Pupils are equal, round, and reactive to light.  Cardiovascular:     Rate and Rhythm: Normal rate and regular rhythm.     Pulses: Normal pulses.     Heart sounds: Normal heart sounds.  Pulmonary:     Effort: Pulmonary effort is normal.     Breath sounds: Examination of the right-lower field reveals rhonchi. Rhonchi present.  Musculoskeletal:     Cervical back: Normal range of motion.  Neurological:     General: No focal deficit present.     Mental Status: She is alert and oriented to person, place, and time. Mental status is at baseline.  Psychiatric:        Mood and Affect: Mood normal.        Behavior: Behavior normal.        Thought Content: Thought content normal.        Judgment: Judgment normal.     Assessment & Plan:  Sore throat -     POCT rapid strep A  Suspected COVID-19 virus infection -     POC COVID-19 BinaxNow  Decreased urine output Assessment & Plan: Unable to void Sent home with specimen cup  Future order for urinalysis with culture reflex  Orders: -     POCT Urinalysis Dipstick (Automated); Future -     Urinalysis w microscopic + reflex cultur; Future  Rhonchi at right lung base -     DG Chest 2 View;  Future -     cefTRIAXone Sodium  SOB (shortness of breath) -     predniSONE; Take two tablets once daily for five days  Dispense: 30 tablet; Refill: 0 -     cefTRIAXone Sodium  Bacterial lower respiratory infection Assessment & Plan: Suspected pneumonia  Stat cxr pending results.  1 g  rocephin in office Consider augmentin /doxy addition pending results if positive  Prednisone given for sob, copd exacerbation  Advised patient on supportive measures:  Be sure to rest, drink plenty of fluids, and use tylenol  as needed for pain. Follow up if fever >101, if symptoms worsen or if symptoms are not improved in 3 days. Patient verbalizes understanding.         Follow up plan: No follow-ups on file.  Mort Sawyers, FNP

## 2024-01-07 NOTE — Assessment & Plan Note (Signed)
Unable to void Sent home with specimen cup  Future order for urinalysis with culture reflex

## 2024-01-09 ENCOUNTER — Encounter: Payer: Self-pay | Admitting: Family

## 2024-01-09 ENCOUNTER — Other Ambulatory Visit: Payer: Self-pay

## 2024-01-09 DIAGNOSIS — R34 Anuria and oliguria: Secondary | ICD-10-CM

## 2024-01-09 LAB — POC URINALSYSI DIPSTICK (AUTOMATED)
Blood, UA: NEGATIVE
Glucose, UA: NEGATIVE
Ketones, UA: NEGATIVE
Leukocytes, UA: NEGATIVE
Nitrite, UA: NEGATIVE
Protein, UA: NEGATIVE
Spec Grav, UA: 1.025
Urobilinogen, UA: 0.2 U/dL
pH, UA: 5.5

## 2024-01-10 ENCOUNTER — Encounter: Payer: Self-pay | Admitting: Family

## 2024-01-10 DIAGNOSIS — B9689 Other specified bacterial agents as the cause of diseases classified elsewhere: Secondary | ICD-10-CM

## 2024-01-11 LAB — URINALYSIS W MICROSCOPIC + REFLEX CULTURE
Bilirubin Urine: NEGATIVE
Glucose, UA: NEGATIVE
Hgb urine dipstick: NEGATIVE
Hyaline Cast: NONE SEEN /LPF
Ketones, ur: NEGATIVE
Nitrites, Initial: NEGATIVE
Protein, ur: NEGATIVE
Specific Gravity, Urine: 1.02 (ref 1.001–1.035)
pH: 5.5 (ref 5.0–8.0)

## 2024-01-11 LAB — URINE CULTURE
MICRO NUMBER:: 15991564
SPECIMEN QUALITY:: ADEQUATE

## 2024-01-11 LAB — CULTURE INDICATED

## 2024-01-13 MED ORDER — AMOXICILLIN-POT CLAVULANATE 875-125 MG PO TABS: 1.0000 | ORAL_TABLET | Freq: Two times a day (BID) | ORAL | 0 refills | Status: DC

## 2024-01-26 DIAGNOSIS — I509 Heart failure, unspecified: Secondary | ICD-10-CM | POA: Diagnosis not present

## 2024-01-26 DIAGNOSIS — J449 Chronic obstructive pulmonary disease, unspecified: Secondary | ICD-10-CM | POA: Diagnosis not present

## 2024-01-28 DIAGNOSIS — I509 Heart failure, unspecified: Secondary | ICD-10-CM | POA: Diagnosis not present

## 2024-01-28 DIAGNOSIS — J449 Chronic obstructive pulmonary disease, unspecified: Secondary | ICD-10-CM | POA: Diagnosis not present

## 2024-02-03 ENCOUNTER — Ambulatory Visit: Payer: Self-pay | Admitting: Family

## 2024-02-03 ENCOUNTER — Ambulatory Visit (INDEPENDENT_AMBULATORY_CARE_PROVIDER_SITE_OTHER): Payer: Medicare HMO

## 2024-02-03 ENCOUNTER — Ambulatory Visit (INDEPENDENT_AMBULATORY_CARE_PROVIDER_SITE_OTHER): Payer: Medicare HMO | Admitting: Internal Medicine

## 2024-02-03 VITALS — BP 138/82 | HR 68 | Temp 98.1°F | Ht 73.0 in | Wt 199.4 lb

## 2024-02-03 DIAGNOSIS — M25562 Pain in left knee: Secondary | ICD-10-CM

## 2024-02-03 DIAGNOSIS — R011 Cardiac murmur, unspecified: Secondary | ICD-10-CM | POA: Insufficient documentation

## 2024-02-03 DIAGNOSIS — M25561 Pain in right knee: Secondary | ICD-10-CM | POA: Insufficient documentation

## 2024-02-03 DIAGNOSIS — M170 Bilateral primary osteoarthritis of knee: Secondary | ICD-10-CM | POA: Diagnosis not present

## 2024-02-03 MED ORDER — HYDROCODONE-ACETAMINOPHEN 5-325 MG PO TABS
1.0000 | ORAL_TABLET | Freq: Four times a day (QID) | ORAL | 0 refills | Status: DC | PRN
Start: 2024-02-03 — End: 2024-02-29

## 2024-02-03 MED ORDER — DICLOFENAC SODIUM 1 % EX GEL: 4.0000 g | Freq: Four times a day (QID) | CUTANEOUS | 0 refills | Status: DC

## 2024-02-03 NOTE — Telephone Encounter (Signed)
Patient previously triaged this morning. Called in to move appt to today with soonest available provider. Appt rescheduled today with assist from CAL for acute visit at [facility]. Appt cancelled for tomorrow.   Copied from CRM [id number]. Topic: Clinical - Red Word Triage >> Feb 03, 2024 11:28 AM Morgan [name] wrote: Red Word that prompted transfer to Nurse Triage: Patient fell, her knees are swelling, and she is in pain

## 2024-02-03 NOTE — Telephone Encounter (Signed)
Noted. Will see her as scheduled

## 2024-02-03 NOTE — Patient Instructions (Addendum)
-  It was a pleasure meeting you today -For your knee pain we will check x-rays of both your knees to ensure that you did not get a fracture when you fell -Prescribed Voltaren gel to use as needed for pain.  This should also help with the inflammation in the joints -I have prescribed hydrocodone with your pain.  Please do not use this medication with your hydroxyzine or alprazolam as this can lead to increased falls -If your knee pain worsens acutely or you notice warmth over the knees or fevers or chills please follow-up with [place] for further evaluation -If your x-rays do not show any fractures would consider physical therapy once your pain improves to help prevent recurrent falls -Please contact us with any questions or concerns

## 2024-02-03 NOTE — Progress Notes (Signed)
Acute Office Visit  Subjective:     Patient ID: Morgan [name], female    DOB: 10-13-1951, 73 y.o.   MRN: [id number]  Chief Complaint  Patient presents with   Acute Visit    Larey Seat down a flight of stairs on 02/01/24 Bilateral knee & shin swelling Pain 10/10 Whole Body is sore    HPI Patient is in today for bilateral knee pain after a recent fall.  Patient states that she fell on a flight of stairs on Saturday and hit both her knees as well as her mouth.  Patient states that she did not lose consciousness and did not feel lightheaded before her fall.  She states that she missed a step.  No fevers or chills.  No recent URI.  Patient states that since the fall she has noted swelling over both her knees as well as the upper part of her legs and has had significant pain (10 out of 10) when she is ambulating.  She has had to use a cane to help her walk.  Of note, patient does have a history of Parkinson's disease and is on alprazolam as well as hydroxyzine as needed which can increase her fall risk.  Review of Systems  Constitutional: Negative.   HENT: Negative.         Mild swelling on the inner side of her upper lip secondary to her fall  Respiratory: Negative.    Cardiovascular: Negative.   Musculoskeletal:  Positive for falls and joint pain.       Bilateral knee pain and swelling after fall  Neurological:  Negative for dizziness and headaches.        Objective:    BP 138/82   Pulse 68   Temp 98.1 F (36.7 C)   Ht 6\' 1"  (1.854 m)   Wt 199 lb 6.4 oz (90.4 kg)   SpO2 98%   BMI 26.31 kg/m    Physical Exam Constitutional:      Appearance: Normal appearance.  HENT:     Head: Normocephalic and atraumatic.     Mouth/Throat:     Comments: Patient with on the inner left side of her upper lip.  No ecchymosis noted Cardiovascular:     Rate and Rhythm: Normal rate and regular rhythm.     Heart sounds: Murmur heard.     Comments: Grade 2 out of 6 systolic murmur best heard in the  right second intercostal space Pulmonary:     Breath sounds: Normal breath sounds. No wheezing or rales.  Musculoskeletal:        General: Swelling and tenderness present.     Right knee: Swelling, effusion and ecchymosis present. No deformity, erythema or bony tenderness. Tenderness present.     Left knee: Swelling, effusion and ecchymosis present. No deformity, erythema or bony tenderness. Tenderness present.     Right lower leg: Swelling present. No deformity.     Left lower leg: Swelling present. No deformity.     Comments: Patient noted to have suprapatellar effusion bilaterally worse on the left than the right as well as a fluid collection bilaterally along the medial aspect of her knee.  Patient with significant tenderness to mild palpation over her patella suprapatellar and along her medial joint line.  Patient was noted to have ecchymosis over the upper aspects of her legs bilaterally with tenderness to palpation noted  Unable to assess joint laxity secondary to her significant tenderness  Neurological:     Mental Status: She  is alert.  Psychiatric:        Mood and Affect: Mood normal.        Behavior: Behavior normal.     No results found for any visits on 02/03/24.      Assessment & Plan:   Problem List Items Addressed This Visit       Other   Acute bilateral knee pain - Primary   -Patient complains of acute bilateral knee pain after a fall on Saturday -She was noted to have tenderness to palpation of her bilateral knees as well as bilateral knee effusions -Ecchymosis noted below her knees (on the medial aspect of her upper legs bilaterally) with some swelling noted -Will obtain x-rays of both knees bilaterally to rule out fractures but this appears less likely -Pain control with Voltaren gel as well as Vicodin as needed -Given patient has a history of Parkinson's disease and her mechanism of injury is not clear (missed a step) I would consider referring her to  physical therapy once her acute pain improves -No further workup at this time. -Return precautions given      Relevant Medications   HYDROcodone-acetaminophen (NORCO/VICODIN) 5-325 MG tablet   diclofenac Sodium (VOLTAREN ARTHRITIS PAIN) 1 % GEL   Other Relevant Orders   DG Knee Complete 4 Views Left   DG Knee Complete 4 Views Right   Heart murmur, systolic   -On routine exam I noted grade 2 out of 6 systolic murmur at the right second intercostal space consistent with aortic stenosis -Patient states that she cannot remember the last time she had a 2D echo (on review of her previous records she did have a 2D echo done 2 years ago with no evidence of stenosis close -She is not complaining of lightheadedness or syncope.  No chest pain or shortness of breath. -This appears to be an incidental finding -I encouraged her to talk to her PCP about repeating her echo  to further evaluate this murmur -No further workup at this time -Patient states she will discuss this with her PCP       Meds ordered this encounter  Medications   HYDROcodone-acetaminophen (NORCO/VICODIN) 5-325 MG tablet    Sig: Take 1 tablet by mouth every 6 (six) hours as needed for moderate pain (pain score 4-6).    Dispense:  30 tablet    Refill:  0   diclofenac Sodium (VOLTAREN ARTHRITIS PAIN) 1 % GEL    Sig: Apply 4 g topically 4 (four) times daily.    Dispense:  100 g    Refill:  0    No follow-ups on file.  Earl Lagos, MD

## 2024-02-03 NOTE — Telephone Encounter (Signed)
  Chief Complaint: fell , bilateral knee pain  walking with cane Symptoms: fell down stairs Friday night hitting mouth and bilateral knees. Knees swollen , walking with cane severe pain with walking  Frequency: Friday night  Pertinent Negatives: Patient denies hitting head.  Disposition: [] ED /[] Urgent Care (no appt availability in office) / [x] Appointment(In office/virtual)/ []  [facility]/ [] Home Care/ [] Refused Recommended Disposition /[] [facility]/ []  Follow-up with PCP Additional Notes:   Scheduled appt earliest available with PCP. Patient did not want to be seen at another location . Please advise if appt comes available today  per patient request. Recommended if sx worsen go to UC/ED.      Copied from CRM [id number]. Topic: Clinical - Red Word Triage >> Feb 03, 2024  9:58 AM Alcus Dad wrote: Red Word that prompted transfer to Nurse Triage: Patient stated that she fell and she is in severe pain in both knees Reason for Disposition  [1] MODERATE weakness (i.e., interferes with work, school, normal activities) AND [2] new-onset or worsening  Answer Assessment - Initial Assessment Questions 1. MECHANISM: "How did the fall happen?"     Fell down stairs 2. DOMESTIC VIOLENCE AND ELDER ABUSE SCREENING: "Did you fall because someone pushed you or tried to hurt you?" If Yes, ask: "Are you safe now?"     na 3. ONSET: "When did the fall happen?" (e.g., minutes, hours, or days ago)     Friday night 4. LOCATION: "What part of the body hit the ground?" (e.g., back, buttocks, head, hips, knees, hands, head, stomach)     Hit mouth, bilateral knees  5. INJURY: "Did you hurt (injure) yourself when you fell?" If Yes, ask: "What did you injure? Tell me more about this?" (e.g., body area; type of injury; pain severity)"     Yes knees  6. PAIN: "Is there any pain?" If Yes, ask: "How bad is the pain?" (e.g., Scale 1-10; or mild,  moderate, severe)   - NONE (0): No pain   - MILD  (1-3): Doesn't interfere with normal activities    - MODERATE (4-7): Interferes with normal activities or awakens from sleep    - SEVERE (8-10): Excruciating pain, unable to do any normal activities      10 with walking  7. SIZE: For cuts, bruises, or swelling, ask: "How large is it?" (e.g., inches or centimeters)      Bilateral knee swelling  8. PREGNANCY: "Is there any chance you are pregnant?" "When was your last menstrual period?"     na 9. OTHER SYMPTOMS: "Do you have any other symptoms?" (e.g., dizziness, fever, weakness; new onset or worsening).      Mouth with "cherry". Bilateral knees swelling using cane to walk 10. CAUSE: "What do you think caused the fall (or falling)?" (e.g., tripped, dizzy spell)       Na  Protocols used: Falls and [facility]

## 2024-02-03 NOTE — Assessment & Plan Note (Signed)
-  On routine exam I noted grade 2 out of 6 systolic murmur at the right second intercostal space consistent with aortic stenosis -Patient states that she cannot remember the last time she had a 2D echo (on review of her previous records she did have a 2D echo done 2 years ago with no evidence of stenosis close -She is not complaining of lightheadedness or syncope.  No chest pain or shortness of breath. -This appears to be an incidental finding -I encouraged her to talk to her PCP about repeating her echo  to further evaluate this murmur -No further workup at this time -Patient states she will discuss this with her PCP

## 2024-02-03 NOTE — Assessment & Plan Note (Signed)
-  Patient complains of acute bilateral knee pain after a fall on Saturday -She was noted to have tenderness to palpation of her bilateral knees as well as bilateral knee effusions -Ecchymosis noted below her knees (on the medial aspect of her upper legs bilaterally) with some swelling noted -Will obtain x-rays of both knees bilaterally to rule out fractures but this appears less likely -Pain control with Voltaren gel as well as Vicodin as needed -Given patient has a history of Parkinson's disease and her mechanism of injury is not clear (missed a step) I would consider referring her to physical therapy once her acute pain improves -No further workup at this time. -Return precautions given

## 2024-02-04 ENCOUNTER — Ambulatory Visit: Payer: Medicare HMO | Admitting: Family

## 2024-02-12 ENCOUNTER — Ambulatory Visit: Payer: Medicare HMO | Admitting: Pulmonary Disease

## 2024-02-12 ENCOUNTER — Telehealth: Payer: Self-pay

## 2024-02-12 ENCOUNTER — Other Ambulatory Visit (HOSPITAL_COMMUNITY): Payer: Self-pay

## 2024-02-12 ENCOUNTER — Encounter: Payer: Self-pay | Admitting: Pulmonary Disease

## 2024-02-12 VITALS — BP 138/82 | HR 74 | Temp 98.4°F | Ht 71.0 in | Wt 194.2 lb

## 2024-02-12 DIAGNOSIS — R0609 Other forms of dyspnea: Secondary | ICD-10-CM

## 2024-02-12 MED ORDER — PREDNISONE 20 MG PO TABS
ORAL_TABLET | ORAL | 0 refills | Status: AC
Start: 2024-02-12 — End: 2024-02-22

## 2024-02-12 NOTE — Progress Notes (Signed)
 @Patient  ID: Morgan [name], female    DOB: 1951-08-17, 73 y.o.   MRN: [id number]  Chief Complaint  Patient presents with   Follow-up    SOB and chest tightness persistent since dx pneumonia.      Referring provider: Mort Sawyers, FNP  HPI:   73 y.o. woman whom we are seeing in follow up for evaluation of chronic cough/bronchitis.  Multiple recent PCP notes reviewed.  Most recent PCP note reviewed.  Most recent oncology note reviewed.  Returns for routine follow-up.  On Breztri.  Another course of recurrent bronchitis.  Symptoms now for over a month.  Not improved with antibiotic course, Augmentin and doxycycline.  Chest x-ray clear 12/2023 on my review and interpretation..  This is just poorly controlled asthma.  Prednisone historically helped.  Markus Daft is helped day-to-day but still with frequent exacerbations.  We discussed biologic therapy and she agreed to move forward.  HPI at initial visit: Patient reports onset of symptoms about 2 months ago.  Started with fever, runny nose, sore throat.  The symptoms resolved but cough persisted.  Productive.  Initially yellow to clear phlegm.  Worsened over the last months to more green phlegm.  Associate with worsening dyspnea on exertion.  No times a day with things are better or worse.  No position to make things better or worse.  She thinks the pollens have made nasal congestion and some cough worse given chronic issues with pollens and nasal/sinus congestion with seasonal changes in the past.  Has been using Advair without much improvement.  Was given a course of prednisone and Augmentin about 1 month ago.  This did help for period of time but symptoms have recurred and back to as bad as they were prior.  Was placed on prednisone and levofloxacin yesterday via PCP.  Review chest x-ray 03/14/2022 that on my review and interpretation reveals hyperinflation with what appears to be chronic bronchial scarring/inflammation.  Review chest x-ray 04/12/2022  that on my interpretation is unchanged from earlier x-ray in March.  PMH: Tobacco abuse in remission, seasonal allergies, hypertension Surgical history: Hysterectomy, appendectomy, esophageal dilation, knee surgery, cholecystectomy Family history: Mother with history of breast cancer, ovarian cancer, father with hypertension, hyperlipidemia, CAD, colon cancer in 56s Social history: Former smoker, Health and safety inspector, quit 2015, retired Financial trader / Pulmonary Flowsheets:   ACT:      No data to display          MMRC:     No data to display          Epworth:      No data to display          Tests:   FENO:  No results found for: "NITRICOXIDE"  PFT:    Latest Ref Rng & Units 07/16/2022    8:57 AM  PFT Results  FVC-Pre L 2.07   FVC-Predicted Pre % 52   FVC-Post L 2.24   FVC-Predicted Post % 56   Pre FEV1/FVC % % 82   Post FEV1/FCV % % 82   FEV1-Pre L 1.69   FEV1-Predicted Pre % 56   FEV1-Post L 1.84   DLCO uncorrected ml/min/mmHg 13.18   DLCO UNC% % 53   DLCO corrected ml/min/mmHg 14.95   DLCO COR %Predicted % 61   DLVA Predicted % 98   TLC L 4.66   TLC % Predicted % 76   RV % Predicted % 92   Personally reviewed interpreted as spirometry just above moderate restriction  versus air trapping.  No bronchodilator spots.  TLC confirms mild restriction.  DLCO moderately reduced.  WALK:      No data to display          Imaging: Personally reviewed and as per EMR discussion this note DG Knee Complete 4 Views Left Result Date: 02/03/2024 CLINICAL DATA:  Bilateral knee pain, fall EXAM: LEFT KNEE - COMPLETE 4+ VIEW; RIGHT KNEE - COMPLETE 4+ VIEW COMPARISON:  08/12/2023 FINDINGS: No evidence of fracture, dislocation, or joint effusion. Mild tricompartmental arthrosis. Soft tissues are unremarkable. IMPRESSION: No fracture or dislocation of the bilateral knees. Mild tricompartmental arthrosis. Electronically Signed   By: Jearld Lesch M.D.   On: 02/03/2024  16:20   DG Knee Complete 4 Views Right Result Date: 02/03/2024 CLINICAL DATA:  Bilateral knee pain, fall EXAM: LEFT KNEE - COMPLETE 4+ VIEW; RIGHT KNEE - COMPLETE 4+ VIEW COMPARISON:  08/12/2023 FINDINGS: No evidence of fracture, dislocation, or joint effusion. Mild tricompartmental arthrosis. Soft tissues are unremarkable. IMPRESSION: No fracture or dislocation of the bilateral knees. Mild tricompartmental arthrosis. Electronically Signed   By: Jearld Lesch M.D.   On: 02/03/2024 16:20    Lab Results: Personally reviewed CBC    Component Value Date/Time   WBC 6.1 12/31/2023 1331   WBC 6.8 12/06/2023 1058   RBC 4.26 12/31/2023 1331   RBC 4.27 12/31/2023 1331   HGB 11.3 (L) 12/31/2023 1331   HGB 12.4 09/18/2012 2000   HCT 36.5 12/31/2023 1331   HCT 37.3 09/18/2012 2000   PLT 227 12/31/2023 1331   PLT 450 (H) 09/18/2012 2000   MCV 85.5 12/31/2023 1331   MCV 86 09/18/2012 2000   MCH 26.5 12/31/2023 1331   MCHC 31.0 12/31/2023 1331   RDW 14.6 12/31/2023 1331   RDW 14.5 09/18/2012 2000   LYMPHSABS 2.0 12/31/2023 1331   MONOABS 0.3 12/31/2023 1331   EOSABS 0.2 12/31/2023 1331   BASOSABS 0.0 12/31/2023 1331    BMET    Component Value Date/Time   NA 139 12/31/2023 1331   NA 143 12/26/2018 0000   NA 139 09/18/2012 2000   K 4.6 12/31/2023 1331   K 3.3 (L) 09/18/2012 2000   CL 107 12/31/2023 1331   CL 99 09/18/2012 2000   CO2 23 12/31/2023 1331   CO2 33 (H) 09/18/2012 2000   GLUCOSE 121 (H) 12/31/2023 1331   GLUCOSE 111 (H) 09/18/2012 2000   BUN 33 (H) 12/31/2023 1331   BUN 16 12/26/2018 0000   BUN 13 09/18/2012 2000   CREATININE 1.17 (H) 12/31/2023 1331   CREATININE 0.72 09/18/2012 2000   CALCIUM 8.9 12/31/2023 1331   CALCIUM 9.4 09/18/2012 2000   GFRNONAA 50 (L) 12/31/2023 1331   GFRNONAA >60 09/18/2012 2000   GFRAA >60 09/02/2020 1658   GFRAA >60 09/18/2012 2000    BNP    Component Value Date/Time   BNP 77.7 08/08/2022 1113    ProBNP    Component Value  Date/Time   PROBNP 180.0 (H) 08/29/2021 0931    Specialty Problems       Pulmonary Problems   COPD (chronic obstructive pulmonary disease) (HCC)   Formatting of this note might be different from the original. Last Assessment & Plan:  Formatting of this note might be different from the original. Good air movement, no wheezes. Endorses sob with activity and fatigue but low suspicion for COPD exacerbation. Suspect is related to pain and post-hospital fatigue.      Oropharyngeal dysphagia   DOE (dyspnea  on exertion)    Allergies  Allergen Reactions   Angiotensin Receptor Blockers Swelling    Angioedema   Meperidine Hives and Anaphylaxis   Strawberry Extract Hives and Swelling   Sucralfate Nausea Only and Other (See Comments)    Severe nausea Other reaction(s): Other (See Comments) Severe nausea Severe nausea   Wasp Venom Hives    Immunization History  Administered Date(s) Administered   Fluad Quad(high Dose 65+) 09/30/2019, 08/29/2020, 08/28/2021, 11/12/2023   Influenza, High Dose Seasonal PF 08/23/2018   Influenza,inj,Quad PF,6+ Mos 02/24/2015   Moderna Sars-Covid-2 Vaccination 03/18/2020, 04/15/2020   Pneumococcal Conjugate-13 11/12/2018   Pneumococcal Polysaccharide-23 02/24/2015    Past Medical History:  Diagnosis Date   Allergy    Anemia    Anxiety    Arthritis    CHF (congestive heart failure) (HCC)    Chronic kidney disease    COPD (chronic obstructive pulmonary disease) (HCC)    Depression    GERD (gastroesophageal reflux disease)    Headache    Hyperlipidemia    Hypertension    MVP (mitral valve prolapse)    Parkinson disease (HCC)    Vascular dementia (HCC)     Tobacco History: Social History   Tobacco Use  Smoking Status Former   Current packs/day: 0.00   Average packs/day: 1 pack/day for 15.0 years (15.0 ttl pk-yrs)   Types: Cigarettes   Start date: 09/12/2002   Quit date: 09/12/2017   Years since quitting: 6.4  Smokeless Tobacco Never    Counseling given: Not Answered   Continue to not smoke  Outpatient Encounter Medications as of 02/12/2024  Medication Sig   predniSONE (DELTASONE) 20 MG tablet Take 2 tablets (40 mg total) by mouth daily with breakfast for 5 days, THEN 1 tablet (20 mg total) daily with breakfast for 5 days.   acetaminophen (TYLENOL) 325 MG tablet Take 2 tablets (650 mg total) by mouth every 4 (four) hours as needed for mild pain (or temp > 37.5 C (99.5 F)).   albuterol (VENTOLIN HFA) 108 (90 Base) MCG/ACT inhaler Inhale 2 puffs into the lungs every 6 (six) hours as needed for wheezing or shortness of breath.   Budeson-Glycopyrrol-Formoterol (BREZTRI AEROSPHERE) 160-9-4.8 MCG/ACT AERO Inhale 2 puffs into the lungs in the morning and at bedtime.   busPIRone (BUSPAR) 5 MG tablet TAKE 1 TABLET BY MOUTH TWICE A DAY   carbidopa-levodopa (SINEMET IR) 25-100 MG tablet Take 2 tablets by mouth 3 (three) times daily.   cyanocobalamin (VITAMIN B12) 100 MCG tablet Take 100 mcg by mouth daily.   diclofenac Sodium (VOLTAREN ARTHRITIS PAIN) 1 % GEL Apply 4 g topically 4 (four) times daily.   EPINEPHrine 0.3 mg/0.3 mL IJ SOAJ injection Inject 0.3 mg into the muscle as needed for anaphylaxis.   fluticasone (FLONASE) 50 MCG/ACT nasal spray Place 2 sprays into both nostrils daily.   folic acid (FOLVITE) 1 MG tablet TAKE 1 TABLET BY MOUTH EVERY DAY   HYDROcodone-acetaminophen (NORCO/VICODIN) 5-325 MG tablet Take 1 tablet by mouth every 6 (six) hours as needed for moderate pain (pain score 4-6).   hydrocortisone (ANUSOL-HC) 25 MG suppository Place 1 suppository (25 mg total) rectally 2 (two) times daily.   ibuprofen (ADVIL) 400 MG tablet Take 1 tablet (400 mg total) by mouth every 8 (eight) hours as needed for headache or moderate pain.   KLOR-CON M20 20 MEQ tablet TAKE 1 TABLET BY MOUTH EVERY DAY   LORazepam (ATIVAN) 0.5 MG tablet Take 1 tablet (0.5 mg total) by  mouth at bedtime as needed for anxiety.   OXYGEN Inhale 3 L/min  into the lungs See admin instructions. 3 L/min at bedtime and as needed for shortness of breath throughout the day   pantoprazole (PROTONIX) 20 MG tablet Take by mouth.   promethazine (PHENERGAN) 12.5 MG tablet Take 1 tablet (12.5 mg total) by mouth every 12 (twelve) hours as needed for nausea or vomiting.   sertraline (ZOLOFT) 100 MG tablet Take two tablets once daily for total 200 mg daily   topiramate (TOPAMAX) 50 MG tablet TAKE 1 TABLET BY MOUTH EVERYDAY AT BEDTIME   No facility-administered encounter medications on file as of 02/12/2024.     Review of Systems  Review of Systems  N/a Physical Exam  BP 138/82 (BP Location: Left Arm, Patient Position: Sitting, Cuff Size: Normal)   Pulse 74   Temp 98.4 F (36.9 C) (Oral)   Ht 5\' 11"  (1.803 m)   Wt 194 lb 3.2 oz (88.1 kg)   SpO2 97%   BMI 27.09 kg/m   Wt Readings from Last 5 Encounters:  02/12/24 194 lb 3.2 oz (88.1 kg)  02/03/24 199 lb 6.4 oz (90.4 kg)  01/07/24 192 lb 6.4 oz (87.3 kg)  01/01/24 191 lb 6.4 oz (86.8 kg)  12/06/23 191 lb 4 oz (86.8 kg)    BMI Readings from Last 5 Encounters:  02/12/24 27.09 kg/m  02/03/24 26.31 kg/m  01/07/24 25.38 kg/m  01/01/24 25.25 kg/m  12/06/23 25.23 kg/m     Physical Exam General: Sitting in chair, no acute distress Eyes: EOMI, icterus Neck: Supple, no JVP Pulmonary: Distant, clear Abdomen: Nondistended, bowel sounds present CV: Regular rate and rhythm, no murmur MSK: No synovitis, joint effusion Neuro: Normal gait, no weakness Psych: Normal mood, full affect   Assessment & Plan:    Dyspnea on exertion: Overall stable.  Now back on Breztri and albuterol.  PFTs without fixed obstruction or bronchodilator response.  Did show some signs of restriction with reduced DLCO.  High-res CT scan 07/2022 reveals mild upper lobe interlobular septal thickening, otherwise clear.  Pattern not clear but not consistent with UIP.  NSIP versus HP versus postinfectious scarring versus  biapical scarring as result of prior smoking history.  High-resolution CT scan 09/2023 with apical scarring not really significant in my opinion.  Really concern for poorly controlled asthma.  Lung exam clear today.  Prednisone taper today.  Nucala paperwork given elevated eosinophils historically.  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  Encouraged her to check her oxygen saturation to maintain 88%.  Notably no clear reason on recent high-res CT scan of the chest for hypoxemia in regards to lung parenchyma.  Mild interstitial findings in the upper lobes do not account for this.    Return in about 3 months (around 05/11/2024) for f/u Dr. Judeth Horn.     Karren Burly, MD 02/12/2024

## 2024-02-12 NOTE — Patient Instructions (Signed)
 Nice to see you again  The CT scan in the interim since last visit shows some mild changes but I do not think we need to worry about.  With the recurrent bronchitis I worry that asthma is poorly controlled.  Prednisone prescribed today to help in the short-term  Paperwork for Nucala to help in the medium to long-term, this is the injection medicine  No changes to inhalers  Return to clinic in 3 months or sooner as needed with Dr. Judeth Horn

## 2024-02-12 NOTE — Telephone Encounter (Signed)
 Received New start paperwork for NUCALA. Will update as we work through the benefits process.  Submitted a Prior Authorization request to [facility] for NUCALA via CoverMyMeds. Authorization has been APPROVED from 12/18/2023 to 12/16/2024. Approval letter sent to scan center.   Test Claim revealed that a 28 day supply has a copay of $1,356.31.   Patient can fill through [facility]: [phone]     CMM KEY: [id number] Authorization#: [id number]   Will work on finalizing PAP paperwork for submission.

## 2024-02-13 ENCOUNTER — Other Ambulatory Visit: Payer: Self-pay | Admitting: Family

## 2024-02-13 DIAGNOSIS — F411 Generalized anxiety disorder: Secondary | ICD-10-CM

## 2024-02-13 DIAGNOSIS — F4323 Adjustment disorder with mixed anxiety and depressed mood: Secondary | ICD-10-CM

## 2024-02-13 MED ORDER — LORAZEPAM 0.5 MG PO TABS: 0.5000 mg | ORAL_TABLET | Freq: Every evening | ORAL | 0 refills | Status: AC | PRN

## 2024-02-17 ENCOUNTER — Other Ambulatory Visit (HOSPITAL_COMMUNITY): Payer: Self-pay

## 2024-02-17 NOTE — Telephone Encounter (Signed)
 Submitted Patient Assistance Application to [employer] [place] for [place] along with provider portion, patient portion, PA, medication list, insurance card copy and income documents. Will update patient when we receive a response.  Phone #: [phone] Fax #: [phone]

## 2024-02-18 DIAGNOSIS — R569 Unspecified convulsions: Secondary | ICD-10-CM | POA: Diagnosis not present

## 2024-02-23 DIAGNOSIS — J449 Chronic obstructive pulmonary disease, unspecified: Secondary | ICD-10-CM | POA: Diagnosis not present

## 2024-02-23 DIAGNOSIS — I509 Heart failure, unspecified: Secondary | ICD-10-CM | POA: Diagnosis not present

## 2024-02-24 ENCOUNTER — Other Ambulatory Visit: Payer: Self-pay

## 2024-02-25 DIAGNOSIS — I509 Heart failure, unspecified: Secondary | ICD-10-CM | POA: Diagnosis not present

## 2024-02-25 DIAGNOSIS — J449 Chronic obstructive pulmonary disease, unspecified: Secondary | ICD-10-CM | POA: Diagnosis not present

## 2024-02-29 ENCOUNTER — Ambulatory Visit (INDEPENDENT_AMBULATORY_CARE_PROVIDER_SITE_OTHER)

## 2024-02-29 ENCOUNTER — Ambulatory Visit: Admission: EM | Admit: 2024-02-29 | Discharge: 2024-02-29 | Disposition: A | Discharge status: Home / Self Care

## 2024-02-29 DIAGNOSIS — R051 Acute cough: Secondary | ICD-10-CM

## 2024-02-29 DIAGNOSIS — J441 Chronic obstructive pulmonary disease with (acute) exacerbation: Secondary | ICD-10-CM

## 2024-02-29 DIAGNOSIS — R059 Cough, unspecified: Secondary | ICD-10-CM | POA: Diagnosis not present

## 2024-02-29 DIAGNOSIS — I7300 Raynaud's syndrome without gangrene: Secondary | ICD-10-CM | POA: Diagnosis not present

## 2024-02-29 MED ORDER — METHYLPREDNISOLONE ACETATE 40 MG/ML IJ SUSP
40.0000 mg | Freq: Once | INTRAMUSCULAR | Status: AC
  Administered 2024-02-29: 40 mg via INTRAMUSCULAR

## 2024-02-29 MED ORDER — AMOXICILLIN-POT CLAVULANATE 875-125 MG PO TABS
1.0000 | ORAL_TABLET | Freq: Two times a day (BID) | ORAL | 0 refills | Status: DC
Start: 2024-02-29 — End: 2024-03-26

## 2024-02-29 MED ORDER — PREDNISONE 20 MG PO TABS: 40.0000 mg | ORAL_TABLET | Freq: Every day | ORAL | 0 refills | Status: AC

## 2024-02-29 MED ORDER — TRAMADOL HCL 50 MG PO TABS: 50.0000 mg | ORAL_TABLET | Freq: Four times a day (QID) | ORAL | 0 refills | Status: DC | PRN

## 2024-02-29 NOTE — ED Triage Notes (Signed)
"  I am on 3L of Oxygen, normally just at night but last 3 days all the time".

## 2024-02-29 NOTE — Discharge Instructions (Addendum)
 Chest x-ray done today.  Final interpretation by the radiologist is pending but when compared to chest x-ray from January, there appears to be no acute changes.  Likely this is COPD exacerbation with overlying sinus infection. Reassuringly your oxygen levels are good at 98%.  We will treat with the following: Medrol injection given today to help with airway inflammation. Prednisone 40 mg (2 tablets) once daily for 5 days. Take this in the morning.  This is a steroid to help with inflammation and pain. Start 03/01/24 Augmentin 875 mg twice daily for 7 days.  This is an antibiotic.  Take this with food.  This is to treat an overlying sinus infection. Tramadol 50 mg every 6 hours as needed for rib pain.  Continue previous home medications Rest and stay hydrated.  Return to urgent care or PCP if symptoms worsen or fail to resolve.

## 2024-02-29 NOTE — ED Triage Notes (Signed)
"  This started about 2 days ago with nose running, now cough (causing my chest to be sore) and sore/hoarse voice". "I have broken ribs twice recently and this is causing my cough to really hurt". No fever. PO's good. Output "normal". Seasonal Flu vaccine done, not COVID19.

## 2024-02-29 NOTE — ED Provider Notes (Signed)
 [facility]    CSN: [id number] Arrival date & time: 02/29/24  0825      History   Chief Complaint Chief Complaint  Patient presents with   Weakness   Sore Throat   COPD Exacerbation    HPI Morgan [name] is a 73 y.o. female.   73 year old female who presents to urgent care with complaints of cough, subjective shortness of breath, nasal congestion and hoarse.  She reports that this has been going on for several days now.  She does use oxygen at home at night but feels like she has been needing it during the day.  She denies any sick contacts that she knows of.  She has not had any fevers or chills.  She is eating and drinking without issues.  She has recently had injuries to her ribs which makes coughing and breathing more painful.  The cough is productive at times.   Weakness Associated symptoms: cough and shortness of breath   Associated symptoms: no abdominal pain, no arthralgias, no chest pain, no dysuria, no fever, no seizures and no vomiting   Sore Throat Associated symptoms include shortness of breath. Pertinent negatives include no chest pain and no abdominal pain.    Past Medical History:  Diagnosis Date   Allergy    Anemia    Anxiety    Arthritis    CHF (congestive heart failure) (HCC)    Chronic kidney disease    COPD (chronic obstructive pulmonary disease) (HCC)    Depression    GERD (gastroesophageal reflux disease)    Headache    Hyperlipidemia    Hypertension    MVP (mitral valve prolapse)    Parkinson disease (HCC)    Vascular dementia (HCC)     Patient Active Problem List   Diagnosis Date Noted   Acute bilateral knee pain 02/03/2024   Heart murmur, systolic 02/03/2024   BRBPR (bright red blood per rectum) 12/06/2023   Lower abdominal pain 12/06/2023   Anxiety state 10/22/2023   At high risk for falls 08/30/2023   Multiple falls 08/30/2023   History of angiotensin II receptor antagonist allergy 07/03/2023   Angioedema 06/21/2023    Blurry vision 05/27/2023   Bilateral tinnitus 05/20/2023   History of syncope 05/20/2023   LOC (loss of consciousness) (HCC) 04/04/2023   Adjustment reaction with anxiety and depression 02/05/2023   Chronic diarrhea 02/05/2023   Carotid stenosis 10/02/2022   Gastroparesis 09/27/2022   Lipoma of colon 09/21/2022   Atherosclerosis of aorta (HCC) 09/21/2022   Diverticulosis 09/21/2022   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   History of ischemic stroke 09/21/2022   History of colon polyps 09/21/2022   Parkinson disease (HCC)    Migraine, unspecified, not intractable, without status migrainosus 04/16/2022   Hemiplegia, unspecified affecting left nondominant side (HCC) 04/16/2022   Hypertensive heart and chronic kidney disease with heart failure and stage 1 through stage 4 chronic kidney disease, or unspecified chronic kidney disease (HCC) 04/16/2022   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   Hemiplegic migraine, not intractable, 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   Vitamin B12 deficiency 02/20/2022   Vitamin D deficiency 02/20/2022   Edema 01/10/2022   Vascular dementia, unspecified severity, with anxiety (HCC) 01/10/2022   Tremor 10/27/2021   Stage 3a chronic kidney disease (HCC) 09/11/2021   IDA (iron deficiency anemia) 09/11/2021   Osteopenia 08/28/2021   DOE (dyspnea on exertion) 08/28/2021   Chronic diastolic CHF (congestive  heart failure) (HCC) 08/09/2020   Pyloric stenosis in adult    Peptic ulcer disease 03/22/2020   Chronic abdominal pain 02/19/2019   CKD (chronic kidney disease) stage 3, GFR 30-59 ml/min (HCC) 01/13/2019   Oropharyngeal dysphagia 11/12/2018   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/2018   COPD (chronic obstructive pulmonary disease) (HCC) 01/14/2018   Non-rheumatic mitral regurgitation 11/21/2017   HLD (hyperlipidemia) 10/04/2017   GERD (gastroesophageal reflux disease) 10/04/2017   Left  hemiparesis (HCC) 10/04/2017   Essential hypertension    Chronic lower back pain 04/29/2013   Paresthesia 04/29/2013    Past Surgical History:  Procedure Laterality Date   ABDOMINAL HYSTERECTOMY     APPENDECTOMY     BALLOON DILATION N/A 04/21/2020   Procedure: BALLOON DILATION;  Surgeon: Rachael Fee, MD;  Location: [facility];  Service: Endoscopy;  Laterality: N/A;  pyloric/ GI   BALLOON DILATION N/A 07/04/2020   Procedure: BALLOON DILATION;  Surgeon: Napoleon Form, MD;  Location: [facility];  Service: Endoscopy;  Laterality: N/A;   BIOPSY  04/21/2020   Procedure: BIOPSY;  Surgeon: Rachael Fee, MD;  Location: [facility];  Service: Endoscopy;;   BIOPSY  07/04/2020   Procedure: BIOPSY;  Surgeon: Napoleon Form, MD;  Location: [facility];  Service: Endoscopy;;  EGD and COLON   BREAST SURGERY Right 1988   tumor removal   CHOLECYSTECTOMY N/A 05/18/2020   Procedure: LAPAROSCOPIC CHOLECYSTECTOMY;  Surgeon: Luretha Murphy, MD;  Location: [facility];  Service: General;  Laterality: N/A;   COLONOSCOPY WITH PROPOFOL N/A 07/04/2020   Procedure: COLONOSCOPY WITH PROPOFOL;  Surgeon: Napoleon Form, MD;  Location: [facility];  Service: Endoscopy;  Laterality: N/A;   COLONOSCOPY WITH PROPOFOL N/A 11/03/2021   Procedure: COLONOSCOPY WITH PROPOFOL;  Surgeon: Regis Bill, MD;  Location: [facility];  Service: Endoscopy;  Laterality: N/A;   COLONOSCOPY WITH PROPOFOL N/A 08/20/2023   Procedure: COLONOSCOPY WITH PROPOFOL;  Surgeon: Regis Bill, MD;  Location: [facility];  Service: Endoscopy;  Laterality: N/A;   ESOPHAGEAL DILATION  08/11/2020   Procedure: PYLORIC DILATION;  Surgeon: Sherrilyn Rist, MD;  Location: [facility];  Service: Gastroenterology;;   ESOPHAGOGASTRODUODENOSCOPY (EGD) WITH PROPOFOL N/A 04/21/2020   Procedure: ESOPHAGOGASTRODUODENOSCOPY (EGD) WITH PROPOFOL;  Surgeon: Rachael Fee, MD;  [facility]: [facility];  Service: Endoscopy;   Laterality: N/A;   ESOPHAGOGASTRODUODENOSCOPY (EGD) WITH PROPOFOL N/A 07/04/2020   Procedure: ESOPHAGOGASTRODUODENOSCOPY (EGD) WITH PROPOFOL;  Surgeon: Napoleon Form, MD;  Location: [facility];  Service: Endoscopy;  Laterality: N/A;   ESOPHAGOGASTRODUODENOSCOPY (EGD) WITH PROPOFOL N/A 08/11/2020   Procedure: ESOPHAGOGASTRODUODENOSCOPY (EGD) WITH PROPOFOL;  Surgeon: Sherrilyn Rist, MD;  Location: [facility];  Service: Gastroenterology;  Laterality: N/A;   ESOPHAGOGASTRODUODENOSCOPY (EGD) WITH PROPOFOL N/A 11/03/2021   Procedure: ESOPHAGOGASTRODUODENOSCOPY (EGD) WITH PROPOFOL;  Surgeon: Regis Bill, MD;  Location: [facility];  Service: Endoscopy;  Laterality: N/A;   ESOPHAGOGASTRODUODENOSCOPY (EGD) WITH PROPOFOL N/A 05/07/2022   Procedure: ESOPHAGOGASTRODUODENOSCOPY (EGD) WITH PROPOFOL;  Surgeon: Toney Reil, MD;  [facility];  Service: Gastroenterology;  Laterality: N/A;   HEMOSTASIS CLIP PLACEMENT  08/20/2023   Procedure: HEMOSTASIS CLIP PLACEMENT;  Surgeon: Regis Bill, MD;  Location: [facility];  Service: Endoscopy;;   KNEE SURGERY  2005   2 times left   KNEE SURGERY Right    KYPHOPLASTY N/A 07/20/2021   Procedure: T 10KYPHOPLASTY;  Surgeon: Kennedy Bucker, MD;  Location: [facility];  Service: Orthopedics;  Laterality: N/A;   KYPHOPLASTY N/A  08/23/2021   Procedure: T9 KYPHOPLASTY;  Surgeon: Kennedy Bucker, MD;  Location: [facility];  Service: Orthopedics;  Laterality: N/A;   POLYPECTOMY  07/04/2020   Procedure: POLYPECTOMY;  Surgeon: Napoleon Form, MD;  Location: [facility];  Service: Endoscopy;;   POLYPECTOMY  08/20/2023   Procedure: POLYPECTOMY;  Surgeon: Regis Bill, MD;  Location: [facility];  Service: Endoscopy;;   SUBMUCOSAL TATTOO INJECTION  08/20/2023   Procedure: SUBMUCOSAL TATTOO INJECTION;  Surgeon: Regis Bill, MD;  Location: [facility];  Service: Endoscopy;;   TUBAL LIGATION      OB History   No  obstetric history on file.      Home Medications    Prior to Admission medications   Medication Sig Start Date End Date Taking? Authorizing Provider  OXYGEN Inhale 3 L/min into the lungs See admin instructions. 3 L/min at bedtime and as needed for shortness of breath throughout the day   Yes [provider]  acetaminophen (TYLENOL) 325 MG tablet Take 2 tablets (650 mg total) by mouth every 4 (four) hours as needed for mild pain (or temp > 37.5 C (99.5 F)). 01/30/22   Marrion Coy, MD  albuterol (VENTOLIN HFA) 108 (90 Base) MCG/ACT inhaler Inhale 2 puffs into the lungs every 6 (six) hours as needed for wheezing or shortness of breath. 02/20/23   Hunsucker, Lesia Sago, MD  Budeson-Glycopyrrol-Formoterol (BREZTRI AEROSPHERE) 160-9-4.8 MCG/ACT AERO Inhale 2 puffs into the lungs in the morning and at bedtime. 02/20/23   Hunsucker, Lesia Sago, MD  busPIRone (BUSPAR) 5 MG tablet TAKE 1 TABLET BY MOUTH TWICE A DAY 12/04/23   Mort Sawyers, FNP  busPIRone (BUSPAR) 5 MG tablet Take 1 tablet by mouth 2 (two) times daily. 12/04/23   [provider]  carbidopa-levodopa (SINEMET IR) 25-100 MG tablet Take 2 tablets by mouth 3 (three) times daily. 11/21/21   [provider]  cyanocobalamin (VITAMIN B12) 100 MCG tablet Take 100 mcg by mouth daily.    [provider]  diclofenac Sodium (VOLTAREN ARTHRITIS PAIN) 1 % GEL Apply 4 g topically 4 (four) times daily. 02/03/24   Earl Lagos, MD  EPINEPHrine 0.3 mg/0.3 mL IJ SOAJ injection Inject 0.3 mg into the muscle as needed for anaphylaxis. 07/03/23   Mort Sawyers, FNP  fluticasone (FLONASE) 50 MCG/ACT nasal spray Place 2 sprays into both nostrils daily. 10/22/23   Mort Sawyers, FNP  folic acid (FOLVITE) 1 MG tablet TAKE 1 TABLET BY MOUTH EVERY DAY 08/29/23   Mort Sawyers, FNP  HYDROcodone-acetaminophen (NORCO/VICODIN) 5-325 MG tablet Take 1 tablet by mouth every 6 (six) hours as needed for moderate pain (pain score 4-6). 02/03/24    Earl Lagos, MD  hydrocortisone (ANUSOL-HC) 25 MG suppository Place 1 suppository (25 mg total) rectally 2 (two) times daily. 12/06/23   Eustaquio Boyden, MD  hydrOXYzine (VISTARIL) 50 MG capsule Take 50 mg by mouth 3 (three) times daily as needed for anxiety or itching. 09/25/23   [provider]  ibuprofen (ADVIL) 400 MG tablet Take 1 tablet (400 mg total) by mouth every 8 (eight) hours as needed for headache or moderate pain. 08/09/22   Enedina Finner, MD  KLOR-CON M20 20 MEQ tablet TAKE 1 TABLET BY MOUTH EVERY DAY 08/29/23   Mort Sawyers, FNP  lamoTRIgine (LAMICTAL) 25 MG tablet Take 25 mg by mouth. 02/21/24 02/20/25  [provider]  LORazepam (ATIVAN) 0.5 MG tablet Take 1 tablet (0.5 mg total) by mouth at bedtime as needed for anxiety. 02/13/24  Mort Sawyers, FNP  NUCALA 100 MG/ML SOAJ  02/21/24   [provider]  pantoprazole (PROTONIX) 20 MG tablet Take by mouth. 01/02/23 01/02/24  [provider]  promethazine (PHENERGAN) 25 MG tablet Take 25 mg by mouth every 6 (six) hours as needed. 02/12/24   [provider]  sertraline (ZOLOFT) 100 MG tablet Take two tablets once daily for total 200 mg daily 10/22/23   Mort Sawyers, FNP  topiramate (TOPAMAX) 50 MG tablet TAKE 1 TABLET BY MOUTH EVERYDAY AT BEDTIME 10/23/23   Mort Sawyers, FNP  Vitamin D, Ergocalciferol, (DRISDOL) 1.25 MG (50000 UNIT) CAPS capsule Take 50,000 Units by mouth once a week. 02/12/24   [provider]    Family History Family History  Problem Relation Age of Onset   Breast cancer Mother 54   Ovarian cancer Mother 1   Brain cancer Mother    Hypertension Father    Hyperlipidemia Father    Heart disease Father    Colon cancer Father        diagnosed in his 48s   Breast cancer Maternal Grandmother 23   Diabetes Paternal Grandmother    Breast cancer Paternal Grandmother 16   Other Son        drug over dose   Colon cancer Maternal Grandfather        dx in his 7s    Liver disease Maternal Grandfather    Breast cancer Maternal Aunt 60   Breast cancer Maternal Aunt 60   Colon cancer Maternal Uncle    Stomach cancer Neg Hx    Pancreatic cancer Neg Hx    Esophageal cancer Neg Hx     Social History Social History   Tobacco Use   Smoking status: Former    Current packs/day: 0.00    Average packs/day: 1 pack/day for 15.0 years (15.0 ttl pk-yrs)    Types: Cigarettes    Start date: 09/12/2002    Quit date: 09/12/2017    Years since quitting: 6.4   Smokeless tobacco: Never  Vaping Use   Vaping status: Never Used  Substance Use Topics   Alcohol use: Never   Drug use: No     Allergies   Angiotensin receptor blockers, Meperidine, Strawberry extract, Sucralfate, and Wasp venom   Review of Systems Review of Systems  Constitutional:  Negative for chills and fever.  HENT:  Positive for congestion, sinus pressure and voice change. Negative for ear pain and sore throat.   Eyes:  Negative for pain and visual disturbance.  Respiratory:  Positive for cough, chest tightness and shortness of breath.   Cardiovascular:  Negative for chest pain and palpitations.  Gastrointestinal:  Negative for abdominal pain and vomiting.  Genitourinary:  Negative for dysuria and hematuria.  Musculoskeletal:  Negative for arthralgias and back pain.  Skin:  Negative for color change and rash.  Neurological:  Positive for weakness. Negative for seizures and syncope.  All other systems reviewed and are negative.    Physical Exam Triage Vital Signs ED Triage Vitals  Encounter Vitals Group     BP 02/29/24 0836 (!) 159/89     Systolic BP Percentile --      Diastolic BP Percentile --      Pulse Rate 02/29/24 0836 81     Resp 02/29/24 0836 (!) 28     Temp 02/29/24 0836 98.2 F (36.8 C)     Temp Source 02/29/24 0836 Oral     SpO2 02/29/24 0836 98 %  Weight 02/29/24 0833 199 lb (90.3 kg)     Height 02/29/24 0833 6\' 1"  (1.854 m)     Head Circumference --      Peak  Flow --      Pain Score 02/29/24 0830 8     Pain Loc --      Pain Education --      Exclude from Growth Chart --    No data found.  Updated Vital Signs BP (!) 159/89 (BP Location: Left Arm)   Pulse 81   Temp 98.2 F (36.8 C) (Oral)   Resp (!) 28   Ht 6\' 1"  (1.854 m)   Wt 199 lb (90.3 kg)   SpO2 97%   BMI 26.25 kg/m   Visual Acuity Right Eye Distance:   Left Eye Distance:   Bilateral Distance:    Right Eye Near:   Left Eye Near:    Bilateral Near:     Physical Exam Vitals and nursing note reviewed.  Constitutional:      General: She is not in acute distress.    Appearance: She is well-developed.  HENT:     Head: Normocephalic and atraumatic.  Eyes:     Conjunctiva/sclera: Conjunctivae normal.  Cardiovascular:     Rate and Rhythm: Normal rate and regular rhythm.     Heart sounds: No murmur heard. Pulmonary:     Effort: Pulmonary effort is normal. No respiratory distress.     Breath sounds: Examination of the right-upper field reveals rhonchi. Examination of the left-middle field reveals rhonchi. Examination of the right-lower field reveals rhonchi. Examination of the left-lower field reveals rhonchi. Rhonchi present. No decreased breath sounds or wheezing.  Abdominal:     Palpations: Abdomen is soft.     Tenderness: There is no abdominal tenderness.  Musculoskeletal:        General: No swelling.     Cervical back: Neck supple.  Skin:    General: Skin is warm and dry.     Capillary Refill: Capillary refill takes less than 2 seconds.  Neurological:     Mental Status: She is alert.  Psychiatric:        Mood and Affect: Mood normal.      UC Treatments / Results  Labs (all labs ordered are listed, but only abnormal results are displayed) Labs Reviewed - No data to display  EKG   Radiology No results found.  Procedures Procedures (including critical care time)  Medications Ordered in UC Medications  methylPREDNISolone acetate (DEPO-MEDROL)  injection 40 mg (has no administration in time range)    Initial Impression / Assessment and Plan / UC Course  I have reviewed the triage vital signs and the nursing notes.  Pertinent labs & imaging results that were available during my care of the patient were reviewed by me and considered in my medical decision making (see chart for details).     Acute cough - Plan: DG Chest 2 View, DG Chest 2 View  COPD exacerbation [facility])   Chest x-ray done today.  Final interpretation by the radiologist is pending but when compared to chest x-ray from January, there appears to be no acute changes.  Likely this is COPD exacerbation with overlying sinus infection. Reassuringly your oxygen levels are good at 98%.  We will treat with the following: Medrol injection given today to help with airway inflammation. Prednisone 40 mg (2 tablets) once daily for 5 days. Take this in the morning.  This is a steroid to help with  inflammation and pain. Start 03/01/24 Augmentin 875 mg twice daily for 7 days.  This is an antibiotic.  Take this with food.  This is to treat an overlying sinus infection. Tramadol 50 mg every 6 hours as needed for rib pain.  Continue previous home medications Rest and stay hydrated.  Return to urgent care or PCP if symptoms worsen or fail to resolve.    Final Clinical Impressions(s) / UC Diagnoses   Final diagnoses:  Acute cough   Discharge Instructions   None    ED Prescriptions   None    PDMP not reviewed this encounter.   Landis Martins, [place] 02/29/24 [phone]

## 2024-03-08 ENCOUNTER — Other Ambulatory Visit: Payer: Self-pay | Admitting: Family

## 2024-03-08 DIAGNOSIS — F411 Generalized anxiety disorder: Secondary | ICD-10-CM

## 2024-03-08 DIAGNOSIS — F331 Major depressive disorder, recurrent, moderate: Secondary | ICD-10-CM

## 2024-03-08 DIAGNOSIS — R112 Nausea with vomiting, unspecified: Secondary | ICD-10-CM

## 2024-03-09 ENCOUNTER — Ambulatory Visit: Payer: Self-pay

## 2024-03-09 ENCOUNTER — Ambulatory Visit: Admission: EM | Admit: 2024-03-09 | Discharge: 2024-03-09 | Disposition: A | Discharge status: Home / Self Care

## 2024-03-09 ENCOUNTER — Ambulatory Visit (INDEPENDENT_AMBULATORY_CARE_PROVIDER_SITE_OTHER)

## 2024-03-09 ENCOUNTER — Encounter: Payer: Self-pay | Admitting: Family Medicine

## 2024-03-09 DIAGNOSIS — Z043 Encounter for examination and observation following other accident: Secondary | ICD-10-CM | POA: Diagnosis not present

## 2024-03-09 DIAGNOSIS — R0781 Pleurodynia: Secondary | ICD-10-CM | POA: Diagnosis not present

## 2024-03-09 DIAGNOSIS — S299XXA Unspecified injury of thorax, initial encounter: Secondary | ICD-10-CM

## 2024-03-09 DIAGNOSIS — W19XXXA Unspecified fall, initial encounter: Secondary | ICD-10-CM

## 2024-03-09 MED ORDER — TIZANIDINE HCL 4 MG PO TABS: 4.0000 mg | ORAL_TABLET | Freq: Three times a day (TID) | ORAL | 0 refills | Status: DC | PRN

## 2024-03-09 NOTE — Telephone Encounter (Addendum)
 This RN was triaging pt when call was disconnected, attempted to return call to pt, LVM requesting return call to office.   Chief Complaint: Fall Symptoms: pain, SOB Frequency: Began yesterday Pertinent Negatives: Patient denies additional injury Disposition: [x] ED /[] Urgent Care (no appt availability in office) / [] Appointment(In office/virtual)/ []  [facility]/ [] Home Care/ [] Refused Recommended Disposition /[] [facility]/ []  Follow-up with PCP Additional Notes: Pt reports she had a fall yesterday, notes "I just fell" reporting she believes she just lost her balance. Pt is having difficulty breathing and pain and believes she may have broken a rib. Pt reports sharp pain with breathing/movement/bending. She states she feels like she cannot "catch her breath". Pt advised UC/ED due to symptoms and need for immediate eval/treat. Pt declines and reports she would like to be seen in office, advised  due to symptoms UC/ED advised and OV unable to be scheduled at this time. Pt verbalized understanding, unsure if she will proceed to UC/ED. CAL notified.    Copied from CRM [id number]. Topic: Clinical - Red Word Triage >> Mar 09, 2024  9:58 AM Efraim Kaufmann C wrote: Red Word that prompted transfer to Nurse Triage: patient fell yesterday and thinks she broke her rib. She is trying to avoid going to the hospital . Answer Assessment - Initial Assessment Questions 1. MECHANISM: "How did the fall happen?"     Lost my balance  3. ONSET: "When did the fall happen?" (e.g., minutes, hours, or days ago)     Yesterday 4. LOCATION: "What part of the body hit the ground?" (e.g., back, buttocks, head, hips, knees, hands, head, stomach)     Front 5. INJURY: "Did you hurt (injure) yourself when you fell?" If Yes, ask: "What did you injure? Tell me more about this?" (e.g., body area; type of injury; pain severity)"     Pt believes she has a broken rib 6. PAIN: "Is there any pain?" If Yes, ask: "How bad  is the pain?" (e.g., Scale 1-10; or mild,  moderate, severe)   - NONE (0): No pain   - MILD (1-3): Doesn't interfere with normal activities    - MODERATE (4-7): Interferes with normal activities or awakens from sleep    - SEVERE (8-10): Excruciating pain, unable to do any normal activities       7. SIZE: For cuts, bruises, or swelling, ask: "How large is it?" (e.g., inches or centimeters)       9. OTHER SYMPTOMS: "Do you have any other symptoms?" (e.g., dizziness, fever, weakness; new onset or worsening).       10. CAUSE: "What do you think caused the fall (or falling)?" (e.g., tripped, dizzy spell)  Protocols used: Falls and [facility]

## 2024-03-09 NOTE — ED Triage Notes (Signed)
"  Yesterday I tripped and fell face forward and I think I broke a rib on each side, very painful". "Some difficulty breathing and taking deep breaths". "I broke some ribs a few months ago and it feels the same way". No lacerations or abrasions. No head injury.

## 2024-03-09 NOTE — Discharge Instructions (Addendum)
 Apply heat pad to ribs where tenderness is present. I have prescribed tizanidine 4 mg every 6 hours as needed for pain. Avoid driving when taking this medication .  I review x-ray images at the area in which you are experiencing pain, no fracture seen.

## 2024-03-09 NOTE — Telephone Encounter (Signed)
 Noted agree with u/c er recommendation.

## 2024-03-09 NOTE — Telephone Encounter (Signed)
 Reason for Disposition . Injury (or injuries) that need emergency care  Protocols used: Falls and [facility]

## 2024-03-09 NOTE — ED Provider Notes (Signed)
 [name]    CSN: [id number] Arrival date & time: 03/09/24  1503      History   Chief Complaint Chief Complaint  Patient presents with   Fall    HPI Morgan [name] is a 73 y.o. female.    Fall  Patient sustained a fall in which she fell forward on her face.  She thinks she broke her rib as she is having bilateral pain on both sides of her rib cage and pain with deep breathing.  Patient has had no head injury.  She reports that she broke her right ribs a few months ago and current symptoms feel the exact same way.  Denies falling on any surface or object.  She reports that during the fall she landed directly forward on the floor.  Denies loss of consciousness.  Past Medical History:  Diagnosis Date   Allergy    Anemia    Anxiety    Arthritis    CHF (congestive heart failure) (HCC)    Chronic kidney disease    COPD (chronic obstructive pulmonary disease) (HCC)    Depression    GERD (gastroesophageal reflux disease)    Headache    Hyperlipidemia    Hypertension    MVP (mitral valve prolapse)    Parkinson disease (HCC)    Vascular dementia (HCC)     Patient Active Problem List   Diagnosis Date Noted   Acute bilateral knee pain 02/03/2024   Heart murmur, systolic 02/03/2024   BRBPR (bright red blood per rectum) 12/06/2023   Lower abdominal pain 12/06/2023   Anxiety state 10/22/2023   At high risk for falls 08/30/2023   Multiple falls 08/30/2023   History of angiotensin II receptor antagonist allergy 07/03/2023   Angioedema 06/21/2023   Blurry vision 05/27/2023   Bilateral tinnitus 05/20/2023   History of syncope 05/20/2023   LOC (loss of consciousness) (HCC) 04/04/2023   Adjustment reaction with anxiety and depression 02/05/2023   Chronic diarrhea 02/05/2023   Carotid stenosis 10/02/2022   Gastroparesis 09/27/2022   Lipoma of colon 09/21/2022   Atherosclerosis of aorta (HCC) 09/21/2022   Diverticulosis 09/21/2022   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HCC)  09/21/2022   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   History of ischemic stroke 09/21/2022   History of colon polyps 09/21/2022   Parkinson disease (HCC)    Migraine, unspecified, not intractable, without status migrainosus 04/16/2022   Hemiplegia, unspecified affecting left nondominant side (HCC) 04/16/2022   Hypertensive heart and chronic kidney disease with heart failure and stage 1 through stage 4 chronic kidney disease, or unspecified chronic kidney disease (HCC) 04/16/2022   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   Hemiplegic migraine, not intractable, 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   Vitamin B12 deficiency 02/20/2022   Vitamin D deficiency 02/20/2022   Edema 01/10/2022   Vascular dementia, unspecified severity, with anxiety (HCC) 01/10/2022   Tremor 10/27/2021   Stage 3a chronic kidney disease (HCC) 09/11/2021   IDA (iron deficiency anemia) 09/11/2021   Osteopenia 08/28/2021   DOE (dyspnea on exertion) 08/28/2021   Chronic diastolic CHF (congestive heart failure) (HCC) 08/09/2020   Pyloric stenosis in adult    Peptic ulcer disease 03/22/2020   Chronic abdominal pain 02/19/2019   CKD (chronic kidney disease) stage 3, GFR 30-59 ml/min (HCC) 01/13/2019   Oropharyngeal dysphagia 11/12/2018   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/2018   COPD (chronic obstructive pulmonary disease) (HCC) 01/14/2018   Non-rheumatic mitral regurgitation 11/21/2017   HLD (hyperlipidemia) 10/04/2017  GERD (gastroesophageal reflux disease) 10/04/2017   Left hemiparesis (HCC) 10/04/2017   Essential hypertension    Chronic lower back pain 04/29/2013   Paresthesia 04/29/2013    Past Surgical History:  Procedure Laterality Date   ABDOMINAL HYSTERECTOMY     APPENDECTOMY     BALLOON DILATION N/A 04/21/2020   Procedure: BALLOON DILATION;  Surgeon: Rachael Fee, MD;  Location: [facility];  Service: Endoscopy;  Laterality: N/A;  pyloric/ GI   BALLOON DILATION N/A 07/04/2020    Procedure: BALLOON DILATION;  Surgeon: Napoleon Form, MD;  Location: [facility];  Service: Endoscopy;  Laterality: N/A;   BIOPSY  04/21/2020   Procedure: BIOPSY;  Surgeon: Rachael Fee, MD;  Location: [facility];  Service: Endoscopy;;   BIOPSY  07/04/2020   Procedure: BIOPSY;  Surgeon: Napoleon Form, MD;  Location: [facility];  Service: Endoscopy;;  EGD and COLON   BREAST SURGERY Right 1988   tumor removal   CHOLECYSTECTOMY N/A 05/18/2020   Procedure: LAPAROSCOPIC CHOLECYSTECTOMY;  Surgeon: Luretha Murphy, MD;  Location: [facility];  Service: General;  Laterality: N/A;   COLONOSCOPY WITH PROPOFOL N/A 07/04/2020   Procedure: COLONOSCOPY WITH PROPOFOL;  Surgeon: Napoleon Form, MD;  Location: [facility];  Service: Endoscopy;  Laterality: N/A;   COLONOSCOPY WITH PROPOFOL N/A 11/03/2021   Procedure: COLONOSCOPY WITH PROPOFOL;  Surgeon: Regis Bill, MD;  Location: [facility];  Service: Endoscopy;  Laterality: N/A;   COLONOSCOPY WITH PROPOFOL N/A 08/20/2023   Procedure: COLONOSCOPY WITH PROPOFOL;  Surgeon: Regis Bill, MD;  Location: [facility];  Service: Endoscopy;  Laterality: N/A;   ESOPHAGEAL DILATION  08/11/2020   Procedure: PYLORIC DILATION;  Surgeon: Sherrilyn Rist, MD;  Location: [facility];  Service: Gastroenterology;;   ESOPHAGOGASTRODUODENOSCOPY (EGD) WITH PROPOFOL N/A 04/21/2020   Procedure: ESOPHAGOGASTRODUODENOSCOPY (EGD) WITH PROPOFOL;  Surgeon: Rachael Fee, MD;  Location: [facility];  Service: Endoscopy;  Laterality: N/A;   ESOPHAGOGASTRODUODENOSCOPY (EGD) WITH PROPOFOL N/A 07/04/2020   Procedure: ESOPHAGOGASTRODUODENOSCOPY (EGD) WITH PROPOFOL;  Surgeon: Napoleon Form, MD;  Location: [facility];  Service: Endoscopy;  Laterality: N/A;   ESOPHAGOGASTRODUODENOSCOPY (EGD) WITH PROPOFOL N/A 08/11/2020   Procedure: ESOPHAGOGASTRODUODENOSCOPY (EGD) WITH PROPOFOL;  Surgeon: Sherrilyn Rist, MD;  Location: [facility];  Service:  Gastroenterology;  Laterality: N/A;   ESOPHAGOGASTRODUODENOSCOPY (EGD) WITH PROPOFOL N/A 11/03/2021   Procedure: ESOPHAGOGASTRODUODENOSCOPY (EGD) WITH PROPOFOL;  Surgeon: Regis Bill, MD;  Location: [facility];  Service: Endoscopy;  Laterality: N/A;   ESOPHAGOGASTRODUODENOSCOPY (EGD) WITH PROPOFOL N/A 05/07/2022   Procedure: ESOPHAGOGASTRODUODENOSCOPY (EGD) WITH PROPOFOL;  Surgeon: Toney Reil, MD;  Location: [facility];  Service: Gastroenterology;  Laterality: N/A;   HEMOSTASIS CLIP PLACEMENT  08/20/2023   Procedure: HEMOSTASIS CLIP PLACEMENT;  Surgeon: Regis Bill, MD;  Location: [facility];  Service: Endoscopy;;   KNEE SURGERY  2005   2 times left   KNEE SURGERY Right    KYPHOPLASTY N/A 07/20/2021   Procedure: T 10KYPHOPLASTY;  Surgeon: Kennedy Bucker, MD;  Location: [facility];  Service: Orthopedics;  Laterality: N/A;   KYPHOPLASTY N/A 08/23/2021   Procedure: T9 KYPHOPLASTY;  Surgeon: Kennedy Bucker, MD;  Location: [facility];  Service: Orthopedics;  Laterality: N/A;   POLYPECTOMY  07/04/2020   Procedure: POLYPECTOMY;  Surgeon: Napoleon Form, MD;  Location: [facility];  Service: Endoscopy;;   POLYPECTOMY  08/20/2023   Procedure: POLYPECTOMY;  Surgeon: Regis Bill, MD;  Location: [facility];  Service: Endoscopy;;   SUBMUCOSAL TATTOO INJECTION  08/20/2023   Procedure: SUBMUCOSAL TATTOO INJECTION;  Surgeon: Regis Bill, MD;  Location[facility];  Service: Endoscopy;;   TUBAL LIGATION      OB History   No obstetric history on file.      Home Medications    Prior to Admission medications   Medication Sig Start Date End Date Taking? Authorizing Provider  metroNIDAZOLE (FLAGYL) 250 MG tablet Take 250 mg by mouth 3 (three) times daily. 01/30/24  Yes [provider]  tiZANidine (ZANAFLEX) 4 MG tablet Take 1 tablet (4 mg total) by mouth every 8 (eight) hours as needed (muscular pain). 03/09/24  Yes Bing Neighbors, NP   acetaminophen (TYLENOL) 325 MG tablet Take 2 tablets (650 mg total) by mouth every 4 (four) hours as needed for mild pain (or temp > 37.5 C (99.5 F)). 01/30/22   Marrion Coy, MD  albuterol (VENTOLIN HFA) 108 (90 Base) MCG/ACT inhaler Inhale 2 puffs into the lungs every 6 (six) hours as needed for wheezing or shortness of breath. 02/20/23   Hunsucker, Lesia Sago, MD  amoxicillin-clavulanate (AUGMENTIN) 875-125 MG tablet Take 1 tablet by mouth every 12 (twelve) hours. 02/29/24   White, Elizabeth A, PA-C  Budeson-Glycopyrrol-Formoterol (BREZTRI AEROSPHERE) 160-9-4.8 MCG/ACT AERO Inhale 2 puffs into the lungs in the morning and at bedtime. 02/20/23   Hunsucker, Lesia Sago, MD  busPIRone (BUSPAR) 5 MG tablet TAKE 1 TABLET BY MOUTH TWICE A DAY 12/04/23   Mort Sawyers, FNP  busPIRone (BUSPAR) 5 MG tablet Take 1 tablet by mouth 2 (two) times daily. 12/04/23   [provider]  carbidopa-levodopa (SINEMET IR) 25-100 MG tablet Take 2 tablets by mouth 3 (three) times daily. 11/21/21   [provider]  cyanocobalamin (VITAMIN B12) 100 MCG tablet Take 100 mcg by mouth daily.    [provider]  diclofenac Sodium (VOLTAREN ARTHRITIS PAIN) 1 % GEL Apply 4 g topically 4 (four) times daily. 02/03/24   Earl Lagos, MD  EPINEPHrine 0.3 mg/0.3 mL IJ SOAJ injection Inject 0.3 mg into the muscle as needed for anaphylaxis. 07/03/23   Mort Sawyers, FNP  fluticasone (FLONASE) 50 MCG/ACT nasal spray Place 2 sprays into both nostrils daily. 10/22/23   Mort Sawyers, FNP  folic acid (FOLVITE) 1 MG tablet TAKE 1 TABLET BY MOUTH EVERY DAY 08/29/23   Mort Sawyers, FNP  hydrocortisone (ANUSOL-HC) 25 MG suppository Place 1 suppository (25 mg total) rectally 2 (two) times daily. 12/06/23   Eustaquio Boyden, MD  hydrOXYzine (VISTARIL) 50 MG capsule Take 50 mg by mouth 3 (three) times daily as needed for anxiety or itching. 09/25/23   [provider]  ibuprofen (ADVIL) 400 MG tablet Take 1 tablet  (400 mg total) by mouth every 8 (eight) hours as needed for headache or moderate pain. 08/09/22   Enedina Finner, MD  KLOR-CON M20 20 MEQ tablet TAKE 1 TABLET BY MOUTH EVERY DAY 08/29/23   Mort Sawyers, FNP  lamoTRIgine (LAMICTAL) 25 MG tablet Take 25 mg by mouth. 02/21/24 02/20/25  [provider]  LORazepam (ATIVAN) 0.5 MG tablet Take 1 tablet (0.5 mg total) by mouth at bedtime as needed for anxiety. 02/13/24   Mort Sawyers, FNP  NUCALA 100 MG/ML SOAJ  02/21/24   [provider]  OXYGEN Inhale 3 L/min into the lungs See admin instructions. 3 L/min at bedtime and as needed for shortness of breath throughout the day    [provider]  pantoprazole (PROTONIX) 20 MG tablet Take by mouth. 01/02/23 01/02/24  [provider]  promethazine (PHENERGAN) 25 MG tablet Take 25 mg by mouth every 6 (six) hours as needed. 02/12/24   [provider]  sertraline (ZOLOFT) 100 MG tablet Take two tablets once daily for total 200 mg daily 10/22/23   Mort Sawyers, FNP  topiramate (TOPAMAX) 50 MG tablet TAKE 1 TABLET BY MOUTH EVERYDAY AT BEDTIME 10/23/23   Mort Sawyers, FNP  traMADol (ULTRAM) 50 MG tablet Take 1 tablet (50 mg total) by mouth every 6 (six) hours as needed. 02/29/24   White, Elizabeth A, PA-C  Vitamin D, Ergocalciferol, (DRISDOL) 1.25 MG (50000 UNIT) CAPS capsule Take 50,000 Units by mouth once a week. 02/12/24   [provider]    Family History Family History  Problem Relation Age of Onset   Breast cancer Mother 43   Ovarian cancer Mother 22   Brain cancer Mother    Hypertension Father    Hyperlipidemia Father    Heart disease Father    Colon cancer Father        diagnosed in his 28s   Breast cancer Maternal Grandmother 48   Diabetes Paternal Grandmother    Breast cancer Paternal Grandmother 78   Other Son        drug over dose   Colon cancer Maternal Grandfather        dx in his 59s   Liver disease Maternal Grandfather    Breast cancer Maternal  Aunt 60   Breast cancer Maternal Aunt 60   Colon cancer Maternal Uncle    Stomach cancer Neg Hx    Pancreatic cancer Neg Hx    Esophageal cancer Neg Hx     Social History Social History   Tobacco Use   Smoking status: Former    Current packs/day: 0.00    Average packs/day: 1 pack/day for 15.0 years (15.0 ttl pk-yrs)    Types: Cigarettes    Start date: 09/12/2002    Quit date: 09/12/2017    Years since quitting: 6.4   Smokeless tobacco: Never  Vaping Use   Vaping status: Never Used  Substance Use Topics   Alcohol use: Never   Drug use: No     Allergies   Angiotensin receptor blockers, Meperidine, Strawberry extract, Sucralfate, and Wasp venom   Review of Systems Review of Systems Pertinent negatives listed in HPI   Physical Exam Triage Vital Signs ED Triage Vitals  Encounter Vitals Group     BP 03/09/24 1609 (!) 161/94     Systolic BP Percentile --      Diastolic BP Percentile --      Pulse Rate 03/09/24 1609 68     Resp 03/09/24 1609 (!) 24     Temp 03/09/24 1609 98.3 F (36.8 C)     Temp Source 03/09/24 1609 Oral     SpO2 03/09/24 1609 100 %     Weight 03/09/24 1607 199 lb 1.2 oz (90.3 kg)     Height 03/09/24 1607 6\' 1"  (1.854 m)     Head Circumference --      Peak Flow --      Pain Score 03/09/24 1604 8     Pain Loc --      Pain Education --      Exclude from Growth Chart --    No data found.  Updated Vital Signs BP (!) 161/94 (BP Location: Left Arm)   Pulse 68   Temp 98.3 F (36.8 C) (Oral)   Resp (!) 22   Ht  6\' 1"  (1.854 m)   Wt 199 lb 1.2 oz (90.3 kg)   SpO2 97%   BMI 26.26 kg/m   Visual Acuity Right Eye Distance:   Left Eye Distance:   Bilateral Distance:    Right Eye Near:   Left Eye Near:    Bilateral Near:     Physical Exam HENT:     Head: Normocephalic and atraumatic.  Eyes:     Extraocular Movements: Extraocular movements intact.     Pupils: Pupils are equal, round, and reactive to light.  Cardiovascular:     Rate and  Rhythm: Normal rate and regular rhythm.  Pulmonary:     Effort: Tachypnea present.  Chest:    Musculoskeletal:     Cervical back: Normal range of motion and neck supple.  Neurological:     General: No focal deficit present.     Mental Status: She is alert.      UC Treatments / Results  Labs (all labs ordered are listed, but only abnormal results are displayed) Labs Reviewed - No data to display  EKG   Radiology DG Chest 2 View Result Date: 03/09/2024 CLINICAL DATA:  Patient fell forward x one day ago and is having bilateral rib pain. Patient is s/p rib fracture right rib 8th and 9th september 2024 EXAM: CHEST - 2 VIEW COMPARISON:  Chest x-ray 02/29/2024. FINDINGS: The heart and mediastinal contours are within normal limits. No focal consolidation. No pulmonary edema. No pleural effusion. No pneumothorax. No acute osseous abnormality. Thoracic kyphoplasty. Right upper quadrant surgical clips. IMPRESSION: No active cardiopulmonary disease. Electronically Signed   By: Tish Frederickson M.D.   On: 03/09/2024 18:47    -Radiology read pending Initial read of chest x-ray interpreted and personally reviewed by this writer, Joaquin Courts, showed no acute vertebral fracture involving the fourth and fifth vertebrae in which the patient was experiencing pain.  Procedures Procedures (including critical care time)  Medications Ordered in UC Medications - No data to display  Initial Impression / Assessment and Plan / UC Course  I have reviewed the triage vital signs and the nursing notes.  Pertinent labs & imaging results that were available during my care of the patient were reviewed by me and considered in my medical decision making (see chart for details).     Traumatic injury involving the ribs, no acute fracture seen on initial read of x-ray, radiology read pending.  Encourage patient to manage symptoms of pain prescribed tizanidine every 8 hours as needed for acute pain.  Patient  cautioned to avoid any driving while taking medication as it causes severe drowsiness.  Patient aware that I will notify her if there is any changes to the read of the x-ray after radiology completes their final read.  Patient verbalized understanding and agreement with plan. Final Clinical Impressions(s) / UC Diagnoses   Final diagnoses:  Traumatic injury of rib  Fall, initial encounter     Discharge Instructions      Apply heat pad to ribs where tenderness is present. I have prescribed tizanidine 4 mg every 6 hours as needed for pain. Avoid driving when taking this medication .  I review x-ray images at the area in which you are experiencing pain, no fracture seen.     ED Prescriptions     Medication Sig Dispense Auth. Provider   tiZANidine (ZANAFLEX) 4 MG tablet Take 1 tablet (4 mg total) by mouth every 8 (eight) hours as needed (muscular pain). 20 tablet Joaquin Courts  S, NP      PDMP not reviewed this encounter.   Bing Neighbors, NP 03/09/24 [phone]

## 2024-03-15 ENCOUNTER — Other Ambulatory Visit: Payer: Self-pay | Admitting: Family

## 2024-03-16 ENCOUNTER — Encounter: Payer: Self-pay | Admitting: Family

## 2024-03-16 ENCOUNTER — Other Ambulatory Visit: Payer: Self-pay | Admitting: Family

## 2024-03-16 DIAGNOSIS — E538 Deficiency of other specified B group vitamins: Secondary | ICD-10-CM

## 2024-03-16 DIAGNOSIS — E876 Hypokalemia: Secondary | ICD-10-CM

## 2024-03-16 DIAGNOSIS — E785 Hyperlipidemia, unspecified: Secondary | ICD-10-CM

## 2024-03-25 ENCOUNTER — Ambulatory Visit: Payer: Self-pay

## 2024-03-25 DIAGNOSIS — I509 Heart failure, unspecified: Secondary | ICD-10-CM | POA: Diagnosis not present

## 2024-03-25 DIAGNOSIS — J449 Chronic obstructive pulmonary disease, unspecified: Secondary | ICD-10-CM | POA: Diagnosis not present

## 2024-03-25 NOTE — Telephone Encounter (Signed)
 Copied from CRM [id number]. Topic: Clinical - Red Word Triage >> Mar 25, 2024  8:31 AM Fonda Kinder J wrote: Red Word that prompted transfer to Nurse Triage: Trouble breathing, eyes swelling, coughing, runny nose    Chief Complaint: Cough, congestion, SOB, runny nose, allergies. Symptoms: Above Frequency: 1 week Pertinent Negatives: Patient denies fever Disposition: [] ED /[] Urgent Care (no appt availability in office) / [x] Appointment(In office/virtual)/ []  [facility]/ [] Home Care/ [] Refused Recommended Disposition /[]  Mobile Bus/ []  Follow-up with PCP Additional Notes: Agrees with appointment.  Reason for Disposition  [1] Continuous (nonstop) coughing interferes with work or school AND [2] no improvement using cough treatment per Care Advice  Answer Assessment - Initial Assessment Questions 1. ONSET: "When did the cough begin?"      1 week 2. SEVERITY: "How bad is the cough today?"      Severe 3. SPUTUM: "Describe the color of your sputum" (none, dry cough; clear, white, yellow, green)     none 4. HEMOPTYSIS: "Are you coughing up any blood?" If so ask: "How much?" (flecks, streaks, tablespoons, etc.)     No 5. DIFFICULTY BREATHING: "Are you having difficulty breathing?" If Yes, ask: "How bad is it?" (e.g., mild, moderate, severe)    - MILD: No SOB at rest, mild SOB with walking, speaks normally in sentences, can lie down, no retractions, pulse < 100.    - MODERATE: SOB at rest, SOB with minimal exertion and prefers to sit, cannot lie down flat, speaks in phrases, mild retractions, audible wheezing, pulse 100-120.    - SEVERE: Very SOB at rest, speaks in single words, struggling to breathe, sitting hunched forward, retractions, pulse > 120      Mild 6. FEVER: "Do you have a fever?" If Yes, ask: "What is your temperature, how was it measured, and when did it start?"     No 7. CARDIAC HISTORY: "Do you have any history of heart disease?" (e.g., heart attack, congestive  heart failure)      No 8. LUNG HISTORY: "Do you have any history of lung disease?"  (e.g., pulmonary embolus, asthma, emphysema)     No 9. PE RISK FACTORS: "Do you have a history of blood clots?" (or: recent major surgery, recent prolonged travel, bedridden)     No 10. OTHER SYMPTOMS: "Do you have any other symptoms?" (e.g., runny nose, wheezing, chest pain)       Runny nose 11. PREGNANCY: "Is there any chance you are pregnant?" "When was your last menstrual period?"       NO 12. TRAVEL: "Have you traveled out of the country in the last month?" (e.g., travel history, exposures)       nO  Protocols used: Cough - Acute Non-Productive-A-AH

## 2024-03-25 NOTE — Telephone Encounter (Signed)
 NOTED

## 2024-03-26 ENCOUNTER — Encounter: Payer: Self-pay | Admitting: Family

## 2024-03-26 ENCOUNTER — Ambulatory Visit (INDEPENDENT_AMBULATORY_CARE_PROVIDER_SITE_OTHER)
Admission: RE | Admit: 2024-03-26 | Discharge: 2024-03-26 | Disposition: A | Discharge status: Home / Self Care | Source: Ambulatory Visit | Attending: Family | Admitting: Family

## 2024-03-26 ENCOUNTER — Ambulatory Visit (INDEPENDENT_AMBULATORY_CARE_PROVIDER_SITE_OTHER): Admitting: Family

## 2024-03-26 VITALS — BP 134/88 | HR 88 | Temp 98.4°F | Ht 73.0 in | Wt 195.4 lb

## 2024-03-26 DIAGNOSIS — R0602 Shortness of breath: Secondary | ICD-10-CM

## 2024-03-26 DIAGNOSIS — R0781 Pleurodynia: Secondary | ICD-10-CM

## 2024-03-26 DIAGNOSIS — J0110 Acute frontal sinusitis, unspecified: Secondary | ICD-10-CM | POA: Diagnosis not present

## 2024-03-26 DIAGNOSIS — R051 Acute cough: Secondary | ICD-10-CM

## 2024-03-26 DIAGNOSIS — F411 Generalized anxiety disorder: Secondary | ICD-10-CM

## 2024-03-26 MED ORDER — HYDROXYZINE PAMOATE 50 MG PO CAPS: 50.0000 mg | ORAL_CAPSULE | Freq: Three times a day (TID) | ORAL | 2 refills | Status: DC | PRN

## 2024-03-26 MED ORDER — DOXYCYCLINE HYCLATE 100 MG PO TABS: 100.0000 mg | ORAL_TABLET | Freq: Two times a day (BID) | ORAL | 0 refills | Status: AC

## 2024-03-26 MED ORDER — BENZONATATE 200 MG PO CAPS: 200.0000 mg | ORAL_CAPSULE | Freq: Two times a day (BID) | ORAL | 0 refills | Status: DC | PRN
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
## 2024-03-26 NOTE — Assessment & Plan Note (Addendum)
RX doxycycline 100 mg po bid x 10 days  Pt to continue tylenol prn sinus pain. Continue with humidifier prn and steam showers recommended as well. instructed If no symptom improvement in 48 hours please f/u
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
## 2024-03-26 NOTE — Assessment & Plan Note (Signed)
 Not improving Advised it may take over a month to improve Heat to site, tylenol prn  Will obtain chest xray with bil rib today to r/o fracture and or pneumonia with retractions upon breathing.  Advised ER precautions

## 2024-03-27 DIAGNOSIS — I509 Heart failure, unspecified: Secondary | ICD-10-CM | POA: Diagnosis not present

## 2024-03-27 DIAGNOSIS — J449 Chronic obstructive pulmonary disease, unspecified: Secondary | ICD-10-CM | POA: Diagnosis not present

## 2024-03-31 ENCOUNTER — Other Ambulatory Visit: Payer: Self-pay | Admitting: Family

## 2024-03-31 DIAGNOSIS — D520 Dietary folate deficiency anemia: Secondary | ICD-10-CM

## 2024-04-06 ENCOUNTER — Other Ambulatory Visit (INDEPENDENT_AMBULATORY_CARE_PROVIDER_SITE_OTHER)

## 2024-04-06 DIAGNOSIS — E785 Hyperlipidemia, unspecified: Secondary | ICD-10-CM | POA: Diagnosis not present

## 2024-04-06 DIAGNOSIS — E876 Hypokalemia: Secondary | ICD-10-CM | POA: Diagnosis not present

## 2024-04-06 DIAGNOSIS — E538 Deficiency of other specified B group vitamins: Secondary | ICD-10-CM

## 2024-04-06 LAB — LIPID PANEL
Cholesterol: 232 mg/dL — ABNORMAL HIGH (ref 0–200)
HDL: 65.2 mg/dL (ref >39.00)
LDL Cholesterol: 141 mg/dL — ABNORMAL HIGH (ref 0–99)
NonHDL: 166.46
Total CHOL/HDL Ratio: 4
Triglycerides: 127 mg/dL (ref 0.0–149.0)
VLDL: 25.4 mg/dL (ref 0.0–40.0)

## 2024-04-06 LAB — B12 AND FOLATE PANEL
Folate: >25.2 ng/mL (ref >5.9)
Vitamin B-12: 1058 pg/mL — ABNORMAL HIGH (ref 211–911)

## 2024-04-06 LAB — BASIC METABOLIC PANEL WITH GFR
BUN: 23 mg/dL (ref 6–23)
CO2: 26 meq/L (ref 19–32)
Calcium: 8.8 mg/dL (ref 8.4–10.5)
Chloride: 107 meq/L (ref 96–112)
Creatinine, Ser: 0.93 mg/dL (ref 0.40–1.20)
GFR: 62.15 mL/min (ref >60.00)
Glucose, Bld: 75 mg/dL (ref 70–99)
Potassium: 3.6 meq/L (ref 3.5–5.1)
Sodium: 142 meq/L (ref 135–145)

## 2024-04-07 ENCOUNTER — Encounter: Payer: Self-pay | Admitting: Family

## 2024-04-07 DIAGNOSIS — D72829 Elevated white blood cell count, unspecified: Secondary | ICD-10-CM | POA: Diagnosis not present

## 2024-04-16 ENCOUNTER — Other Ambulatory Visit: Payer: Self-pay | Admitting: Family

## 2024-04-16 DIAGNOSIS — J0110 Acute frontal sinusitis, unspecified: Secondary | ICD-10-CM

## 2024-04-16 DIAGNOSIS — F331 Major depressive disorder, recurrent, moderate: Secondary | ICD-10-CM

## 2024-04-16 DIAGNOSIS — R112 Nausea with vomiting, unspecified: Secondary | ICD-10-CM

## 2024-04-16 DIAGNOSIS — F411 Generalized anxiety disorder: Secondary | ICD-10-CM

## 2024-04-22 ENCOUNTER — Encounter: Payer: Self-pay | Admitting: Family

## 2024-04-22 ENCOUNTER — Ambulatory Visit (INDEPENDENT_AMBULATORY_CARE_PROVIDER_SITE_OTHER)
Admission: RE | Admit: 2024-04-22 | Discharge: 2024-04-22 | Disposition: A | Discharge status: Home / Self Care | Source: Ambulatory Visit | Attending: Family

## 2024-04-22 ENCOUNTER — Other Ambulatory Visit: Payer: Self-pay | Admitting: Family

## 2024-04-22 ENCOUNTER — Ambulatory Visit
Admission: RE | Admit: 2024-04-22 | Discharge: 2024-04-22 | Disposition: A | Discharge status: Home / Self Care | Source: Ambulatory Visit | Attending: Family | Admitting: Family

## 2024-04-22 ENCOUNTER — Ambulatory Visit (INDEPENDENT_AMBULATORY_CARE_PROVIDER_SITE_OTHER): Admitting: Family

## 2024-04-22 VITALS — BP 138/70 | HR 82 | Temp 98.1°F | Ht 73.0 in | Wt 197.0 lb

## 2024-04-22 DIAGNOSIS — M25561 Pain in right knee: Secondary | ICD-10-CM

## 2024-04-22 DIAGNOSIS — M7989 Other specified soft tissue disorders: Secondary | ICD-10-CM | POA: Diagnosis not present

## 2024-04-22 DIAGNOSIS — M79604 Pain in right leg: Secondary | ICD-10-CM

## 2024-04-22 DIAGNOSIS — M170 Bilateral primary osteoarthritis of knee: Secondary | ICD-10-CM | POA: Diagnosis not present

## 2024-04-22 DIAGNOSIS — J301 Allergic rhinitis due to pollen: Secondary | ICD-10-CM

## 2024-04-22 MED ORDER — PREDNISONE 10 MG (21) PO TBPK: ORAL_TABLET | ORAL | 0 refills | Status: DC

## 2024-04-22 NOTE — Assessment & Plan Note (Signed)
 Stop benadryl  start zyrtec   Continue flonase , but if needed can change to rhinocort  Rx prednisone  pack take as directed Can consider referral to ENT if no improvement with these changes

## 2024-04-22 NOTE — Telephone Encounter (Unsigned)
 Copied from CRM [id number]. Topic: General - Other >> Apr 22, 2024  2:46 PM Rosheka N wrote: Reason for CRM: right leg ultra sound negative DVT no blood clots were seen in the vein the ultra sound report Dr. Reagan Camera stated pt has went home.

## 2024-04-22 NOTE — Patient Instructions (Addendum)
  Get some over the counter zyrtec  and stop benadryl .  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

## 2024-04-22 NOTE — Progress Notes (Signed)
 Established Patient Office Visit  Subjective:   Patient ID: Morgan [name], female    DOB: August 06, 1951  Age: 73 y.o. MRN: [id number]  CC:  Chief Complaint  Patient presents with   Acute Visit    Having issues with allergies    HPI: Morgan [name] is a 73 y.o. female presenting on 04/22/2024 for Acute Visit (Having issues with allergies)  Still with ongoing allergy issues   Swelling of her left medial knee feels really sore and she doesn't know any trauma or injury. Started swelling yesterday morning. She does have a h/o two surgeries from tearing meniscus x 2. She thinks it might be arthritis.   Allergic rhinitis: still having lots of coughing and sneezing and is losing her voice so she has staying in the house. Otc meds include allergy medication (benadryl ) as well as flonase . Still with sinus tenderness in her face and runny nose.  She was treated for sinusitis April 10 with doxycycline . She states there wasn't really any improvement in her sinus pressure.       ROS: Negative unless specifically indicated above in HPI.   Relevant past medical history reviewed and updated as indicated.   Allergies and medications reviewed and updated.   Current Outpatient Medications:    acetaminophen  (TYLENOL ) 325 MG tablet, Take 2 tablets (650 mg total) by mouth every 4 (four) hours as needed for mild pain (or temp > 37.5 C (99.5 F))., Disp: , Rfl:    albuterol  (VENTOLIN  HFA) 108 (90 Base) MCG/ACT inhaler, Inhale 2 puffs into the lungs every 6 (six) hours as needed for wheezing or shortness of breath., Disp: 1 each, Rfl: 11   Budeson-Glycopyrrol-Formoterol  (BREZTRI  AEROSPHERE) 160-9-4.8 MCG/ACT AERO, Inhale 2 puffs into the lungs in the morning and at bedtime., Disp: 1 each, Rfl: 11   busPIRone  (BUSPAR ) 5 MG tablet, TAKE 1 TABLET BY MOUTH TWICE A DAY, Disp: 180 tablet, Rfl: 1   busPIRone  (BUSPAR ) 5 MG tablet, Take 1 tablet by mouth 2 (two) times daily., Disp: , Rfl:    carbidopa -levodopa   (SINEMET  IR) 25-100 MG tablet, Take 2 tablets by mouth 3 (three) times daily., Disp: , Rfl:    cyanocobalamin  (VITAMIN B12) 100 MCG tablet, Take 100 mcg by mouth daily., Disp: , Rfl:    diclofenac  Sodium (VOLTAREN  ARTHRITIS PAIN) 1 % GEL, Apply 4 g topically 4 (four) times daily., Disp: 100 g, Rfl: 0   EPINEPHrine  0.3 mg/0.3 mL IJ SOAJ injection, Inject 0.3 mg into the muscle as needed for anaphylaxis., Disp: 1 each, Rfl: 1   fluticasone  (FLONASE ) 50 MCG/ACT nasal spray, SPRAY 2 SPRAYS INTO EACH NOSTRIL EVERY DAY, Disp: 48 mL, Rfl: 2   folic acid  (FOLVITE ) 1 MG tablet, TAKE 1 TABLET BY MOUTH EVERY DAY, Disp: 90 tablet, Rfl: 1   hydrocortisone  (ANUSOL -HC) 25 MG suppository, Place 1 suppository (25 mg total) rectally 2 (two) times daily., Disp: 12 suppository, Rfl: 0   hydrOXYzine  (VISTARIL ) 50 MG capsule, Take 1 capsule (50 mg total) by mouth 3 (three) times daily as needed for anxiety or itching., Disp: 30 capsule, Rfl: 2   ibuprofen  (ADVIL ) 400 MG tablet, Take 1 tablet (400 mg total) by mouth every 8 (eight) hours as needed for headache or moderate pain., Disp: 20 tablet, Rfl: 0   KLOR-CON  M20 20 MEQ tablet, TAKE 1 TABLET BY MOUTH EVERY DAY, Disp: 90 tablet, Rfl: 1   lamoTRIgine (LAMICTAL) 25 MG tablet, Take 25 mg by mouth., Disp: , Rfl:  LORazepam  (ATIVAN ) 0.5 MG tablet, Take 1 tablet (0.5 mg total) by mouth at bedtime as needed for anxiety., Disp: 30 tablet, Rfl: 0   metroNIDAZOLE  (FLAGYL ) 250 MG tablet, Take 250 mg by mouth 3 (three) times daily., Disp: , Rfl:    NUCALA 100 MG/ML SOAJ, , Disp: , Rfl:    OXYGEN , Inhale 3 L/min into the lungs See admin instructions. 3 L/min at bedtime and as needed for shortness of breath throughout the day, Disp: , Rfl:    predniSONE  (STERAPRED UNI-PAK 21 TAB) 10 MG (21) TBPK tablet, Take as directed, Disp: 1 each, Rfl: 0   promethazine  (PHENERGAN ) 25 MG tablet, Take 25 mg by mouth every 6 (six) hours as needed., Disp: , Rfl:    sertraline  (ZOLOFT ) 100 MG  tablet, Take two tablets once daily for total 200 mg daily, Disp: 180 tablet, Rfl: 3   tiZANidine  (ZANAFLEX ) 4 MG tablet, Take 1 tablet (4 mg total) by mouth every 8 (eight) hours as needed (muscular pain)., Disp: 20 tablet, Rfl: 0   topiramate  (TOPAMAX ) 50 MG tablet, TAKE 1 TABLET BY MOUTH EVERYDAY AT BEDTIME, Disp: 90 tablet, Rfl: 1   traMADol  (ULTRAM ) 50 MG tablet, Take 1 tablet (50 mg total) by mouth every 6 (six) hours as needed., Disp: 15 tablet, Rfl: 0   Vitamin D , Ergocalciferol , (DRISDOL) 1.25 MG (50000 UNIT) CAPS capsule, Take 50,000 Units by mouth once a week., Disp: , Rfl:    pantoprazole  (PROTONIX ) 20 MG tablet, Take by mouth., Disp: , Rfl:   Allergies  Allergen Reactions   Angiotensin Receptor Blockers Swelling    Angioedema   Meperidine Hives and Anaphylaxis   Strawberry Extract Hives and Swelling   Sucralfate  Nausea Only and Other (See Comments)    Severe nausea Other reaction(s): Other (See Comments) Severe nausea Severe nausea   Wasp Venom Hives    Objective:   BP 138/70 (BP Location: Left Arm, Patient Position: Sitting, Cuff Size: Large)   Pulse 82   Temp 98.1 F (36.7 C) (Temporal)   Ht 6\' 1"  (1.854 m)   Wt 197 lb (89.4 kg)   SpO2 98%   BMI 25.99 kg/m    Physical Exam Constitutional:      General: She is not in acute distress.    Appearance: Normal appearance. She is normal weight. She is not ill-appearing, toxic-appearing or diaphoretic.  HENT:     Head: Normocephalic.     Right Ear: Tympanic membrane normal.     Left Ear: Tympanic membrane normal.     Nose: Congestion and rhinorrhea present. No mucosal edema.     Right Sinus: Maxillary sinus tenderness and frontal sinus tenderness present.     Left Sinus: Maxillary sinus tenderness and frontal sinus tenderness present.     Mouth/Throat:     Mouth: Mucous membranes are dry.     Pharynx: No oropharyngeal exudate or posterior oropharyngeal erythema.  Eyes:     Extraocular Movements: Extraocular  movements intact.     Pupils: Pupils are equal, round, and reactive to light.  Cardiovascular:     Rate and Rhythm: Normal rate and regular rhythm.     Pulses: Normal pulses.     Heart sounds: Normal heart sounds.  Pulmonary:     Effort: Pulmonary effort is normal.     Breath sounds: Normal breath sounds.  Musculoskeletal:     Cervical back: Normal range of motion.     Right knee: Swelling present. Decreased range of motion. Tenderness present over  the medial joint line.  Neurological:     General: No focal deficit present.     Mental Status: She is alert and oriented to person, place, and time. Mental status is at baseline.  Psychiatric:        Mood and Affect: Mood normal.        Behavior: Behavior normal.        Thought Content: Thought content normal.        Judgment: Judgment normal.     Assessment & Plan:  Right leg pain -     US  Venous Img Lower Unilateral Right (DVT); Future  Right leg swelling -     US  Venous Img Lower Unilateral Right (DVT); Future  Acute pain of right knee -     DG Knee Complete 4 Views Right; Future -     predniSONE ; Take as directed  Dispense: 1 each; Refill: 0  Seasonal allergic rhinitis due to pollen Assessment & Plan: Stop benadryl  start zyrtec   Continue flonase , but if needed can change to rhinocort  Rx prednisone  pack take as directed Can consider referral to ENT if no improvement with these changes      Follow up plan: Return if symptoms worsen or fail to improve.  Felicita Horns, FNP

## 2024-04-23 ENCOUNTER — Encounter: Payer: Self-pay | Admitting: Family

## 2024-04-23 ENCOUNTER — Telehealth: Payer: Self-pay | Admitting: Family

## 2024-04-23 DIAGNOSIS — M25561 Pain in right knee: Secondary | ICD-10-CM

## 2024-04-23 MED ORDER — PREDNISONE 10 MG (21) PO TBPK
ORAL_TABLET | ORAL | 0 refills | Status: DC
Start: 2024-04-23 — End: 2024-05-19

## 2024-04-23 NOTE — Telephone Encounter (Signed)
 Copied from CRM [id number]. Topic: General - Other >> Apr 23, 2024 10:41 AM Orien Bird wrote: Reason for CRM: Patient stated she saw dr yesterday and she is wanting dr to send something in for pain she stated she can send to the [facility]

## 2024-04-23 NOTE — Telephone Encounter (Signed)
 Prednisone  was sent yesterday but to [facility]. Just resent to [facility].

## 2024-04-23 NOTE — Addendum Note (Signed)
 Addended by: Felicita Horns on: 04/23/2024 02:19 PM   Modules accepted: Orders

## 2024-04-24 ENCOUNTER — Ambulatory Visit: Admitting: Family

## 2024-04-24 DIAGNOSIS — I509 Heart failure, unspecified: Secondary | ICD-10-CM | POA: Diagnosis not present

## 2024-04-24 DIAGNOSIS — J449 Chronic obstructive pulmonary disease, unspecified: Secondary | ICD-10-CM | POA: Diagnosis not present

## 2024-04-24 DIAGNOSIS — M1711 Unilateral primary osteoarthritis, right knee: Secondary | ICD-10-CM | POA: Insufficient documentation

## 2024-04-24 NOTE — Telephone Encounter (Signed)
 Called pt per Tabitha's request. I had to leave a message as she did not answer the phone.  Per Melba Spittle - pt needs to go to [facility] urgent care, she might need an injection or fluid to be drained from her knee.

## 2024-04-24 NOTE — Telephone Encounter (Signed)
 Spoke with pt on the phone. She is aware of Tabitha's message.

## 2024-04-26 DIAGNOSIS — I509 Heart failure, unspecified: Secondary | ICD-10-CM | POA: Diagnosis not present

## 2024-04-26 DIAGNOSIS — J449 Chronic obstructive pulmonary disease, unspecified: Secondary | ICD-10-CM | POA: Diagnosis not present

## 2024-04-27 ENCOUNTER — Other Ambulatory Visit: Payer: Self-pay | Admitting: Family

## 2024-04-27 DIAGNOSIS — F4323 Adjustment disorder with mixed anxiety and depressed mood: Secondary | ICD-10-CM

## 2024-04-28 ENCOUNTER — Encounter: Payer: Self-pay | Admitting: Family

## 2024-05-05 ENCOUNTER — Encounter (INDEPENDENT_AMBULATORY_CARE_PROVIDER_SITE_OTHER): Payer: Self-pay

## 2024-05-18 ENCOUNTER — Ambulatory Visit: Payer: Self-pay

## 2024-05-18 DIAGNOSIS — M1711 Unilateral primary osteoarthritis, right knee: Secondary | ICD-10-CM | POA: Diagnosis not present

## 2024-05-18 DIAGNOSIS — J324 Chronic pansinusitis: Secondary | ICD-10-CM

## 2024-05-18 DIAGNOSIS — F411 Generalized anxiety disorder: Secondary | ICD-10-CM

## 2024-05-18 DIAGNOSIS — R448 Other symptoms and signs involving general sensations and perceptions: Secondary | ICD-10-CM

## 2024-05-18 NOTE — Telephone Encounter (Signed)
 NOTED

## 2024-05-18 NOTE — Telephone Encounter (Signed)
 Copied from CRM [id number]. Topic: Clinical - Red Word Triage >> May 18, 2024  8:36 AM Zipporah Him wrote: Red Word that prompted transfer to Nurse Triage: Suspects sinus infection, head stopped up, sinuses hurting. Onset a week  Chief Complaint: sinus congestion Symptoms: pain in forehead, under eyes, sinus drainage in throat, cough, nasal congestion,  Frequency: 1.5 weeks Pertinent Negatives: Patient denies fever Disposition: [] ED /[] Urgent Care (no appt availability in office) / [x] Appointment(In office/virtual)/ []  [place] [facility]/ [] Home Care/ [] Refused Recommended Disposition /[] [facility]/ []  Follow-up with PCP Additional Notes:pt has been taking OTC sinus medication without relief.  Head congestion/pressure, bilateral earache, throat irritation.  In office appt scheduled for 05/19/2024  Reason for Disposition  Earache  Answer Assessment - Initial Assessment Questions 1. LOCATION: "Where does it hurt?"      Sinus pain -under bilateral eyes, bilateral ears 2. ONSET: "When did the sinus pain start?"  (e.g., hours, days)      X 11.5  weeks 3. SEVERITY: "How bad is the pain?"   (Scale 1-10; mild, moderate or severe)   - MILD (1-3): doesn't interfere with normal activities    - MODERATE (4-7): interferes with normal activities (e.g., work or school) or awakens from sleep   - SEVERE (8-10): excruciating pain and patient unable to do any normal activities        Moderate to severe 4. RECURRENT SYMPTOM: "Have you ever had sinus problems before?" If Yes, ask: "When was the last time?" and "What happened that time?"      yes 5. NASAL CONGESTION: "Is the nose blocked?" If Yes, ask: "Can you open it or must you breathe through your mouth?"     Clear with blood tinge 6. NASAL DISCHARGE: "Do you have discharge from your nose?" If so ask, "What color?"     clear 7. FEVER: "Do you have a fever?" If Yes, ask: "What is it, how was it measured, and when did it start?"      no 8.  OTHER SYMPTOMS: "Do you have any other symptoms?" (e.g., sore throat, cough, earache, difficulty breathing)     SOB at times, sore throat 9. PREGNANCY: "Is there any chance you are pregnant?" "When was your last menstrual period?"     N/a  Protocols used: Sinus Pain or Congestion-A-AH

## 2024-05-19 ENCOUNTER — Ambulatory Visit (INDEPENDENT_AMBULATORY_CARE_PROVIDER_SITE_OTHER): Admitting: Family

## 2024-05-19 ENCOUNTER — Encounter: Payer: Self-pay | Admitting: Family

## 2024-05-19 VITALS — BP 122/86 | HR 70 | Temp 98.0°F | Ht 73.0 in | Wt 192.8 lb

## 2024-05-19 DIAGNOSIS — J0101 Acute recurrent maxillary sinusitis: Secondary | ICD-10-CM

## 2024-05-19 DIAGNOSIS — R110 Nausea: Secondary | ICD-10-CM

## 2024-05-19 MED ORDER — AMOXICILLIN-POT CLAVULANATE 875-125 MG PO TABS
1.0000 | ORAL_TABLET | Freq: Two times a day (BID) | ORAL | 0 refills | Status: DC
Start: 2024-05-19 — End: 2024-05-19

## 2024-05-19 MED ORDER — LEVOFLOXACIN 500 MG PO TABS: 500.0000 mg | ORAL_TABLET | Freq: Every day | ORAL | 0 refills | Status: AC

## 2024-05-19 MED ORDER — PROMETHAZINE HCL 25 MG PO TABS: 25.0000 mg | ORAL_TABLET | Freq: Four times a day (QID) | ORAL | 0 refills | Status: DC | PRN

## 2024-05-19 NOTE — Progress Notes (Signed)
 Established Patient Office Visit  Subjective:   Patient ID: Morgan [name], female    DOB: [birth year]  Age: 73 y.o. MRN: [id number]  CC:  Chief Complaint  Patient presents with   Acute Visit    Sinus congestion    HPI: Morgan [name] is a 73 y.o. female presenting on 05/19/2024 for Acute Visit (Sinus congestion)  Still with sinus congestion, ongoing since April. Was treated back then with doxycycline . Then again saw me on 5/7 with coughing sneezing and with no improvement with her sinus pressure in face in fact slightly worse so I gave her a prednisone  pack. The prednisone  pack did not seem to help much, only slightly. She does not have ENT. She is taking zyrtec  and also flonase . Still with sob and doe. She did have augmentin  in march for similar symptoms as well.   She does see pulmonary for DOE, she is due for a f/u appt. On breztri  and albuterol  prn. PFTS have been completed no fixed obstruction. CT scan 07/2022 mild upper lobe interlobular septal thickening. CT scan 10/24 with apical scarring. She is awaiting new treatment pending insurance then was advised to schedule f/u appt with pulmonary.        ROS: Negative unless specifically indicated above in HPI.   Relevant past medical history reviewed and updated as indicated.   Allergies and medications reviewed and updated.   Current Outpatient Medications:    acetaminophen  (TYLENOL ) 325 MG tablet, Take 2 tablets (650 mg total) by mouth every 4 (four) hours as needed for mild pain (or temp > 37.5 C (99.5 F))., Disp: , Rfl:    albuterol  (VENTOLIN  HFA) 108 (90 Base) MCG/ACT inhaler, Inhale 2 puffs into the lungs every 6 (six) hours as needed for wheezing or shortness of breath., Disp: 1 each, Rfl: 11   Budeson-Glycopyrrol-Formoterol  (BREZTRI  AEROSPHERE) 160-9-4.8 MCG/ACT AERO, Inhale 2 puffs into the lungs in the morning and at bedtime., Disp: 1 each, Rfl: 11   busPIRone  (BUSPAR ) 5 MG tablet, TAKE 1 TABLET BY MOUTH TWICE A DAY, Disp:  180 tablet, Rfl: 1   carbidopa -levodopa  (SINEMET  IR) 25-100 MG tablet, Take 2 tablets by mouth 3 (three) times daily., Disp: , Rfl:    cyanocobalamin  (VITAMIN B12) 100 MCG tablet, Take 100 mcg by mouth daily., Disp: , Rfl:    diclofenac  Sodium (VOLTAREN  ARTHRITIS PAIN) 1 % GEL, Apply 4 g topically 4 (four) times daily., Disp: 100 g, Rfl: 0   EPINEPHrine  0.3 mg/0.3 mL IJ SOAJ injection, Inject 0.3 mg into the muscle as needed for anaphylaxis., Disp: 1 each, Rfl: 1   fluticasone  (FLONASE ) 50 MCG/ACT nasal spray, SPRAY 2 SPRAYS INTO EACH NOSTRIL EVERY DAY, Disp: 48 mL, Rfl: 2   folic acid  (FOLVITE ) 1 MG tablet, TAKE 1 TABLET BY MOUTH EVERY DAY, Disp: 90 tablet, Rfl: 1   hydrocortisone  (ANUSOL -HC) 25 MG suppository, Place 1 suppository (25 mg total) rectally 2 (two) times daily., Disp: 12 suppository, Rfl: 0   hydrOXYzine  (VISTARIL ) 50 MG capsule, Take 1 capsule (50 mg total) by mouth 3 (three) times daily as needed for anxiety or itching., Disp: 30 capsule, Rfl: 2   ibuprofen  (ADVIL ) 400 MG tablet, Take 1 tablet (400 mg total) by mouth every 8 (eight) hours as needed for headache or moderate pain., Disp: 20 tablet, Rfl: 0   KLOR-CON  M20 20 MEQ tablet, TAKE 1 TABLET BY MOUTH EVERY DAY, Disp: 90 tablet, Rfl: 1   lamoTRIgine (LAMICTAL) 25 MG tablet, Take 25 mg by  mouth., Disp: , Rfl:    levofloxacin  (LEVAQUIN ) 500 MG tablet, Take 1 tablet (500 mg total) by mouth daily for 5 days., Disp: 5 tablet, Rfl: 0   LORazepam  (ATIVAN ) 0.5 MG tablet, Take 1 tablet (0.5 mg total) by mouth at bedtime as needed for anxiety., Disp: 30 tablet, Rfl: 0   NUCALA 100 MG/ML SOAJ, , Disp: , Rfl:    OXYGEN , Inhale 3 L/min into the lungs See admin instructions. 3 L/min at bedtime and as needed for shortness of breath throughout the day, Disp: , Rfl:    sertraline  (ZOLOFT ) 100 MG tablet, Take two tablets once daily for total 200 mg daily, Disp: 180 tablet, Rfl: 3   tiZANidine  (ZANAFLEX ) 4 MG tablet, Take 1 tablet (4 mg total) by  mouth every 8 (eight) hours as needed (muscular pain)., Disp: 20 tablet, Rfl: 0   topiramate  (TOPAMAX ) 50 MG tablet, TAKE 1 TABLET BY MOUTH EVERYDAY AT BEDTIME, Disp: 90 tablet, Rfl: 1   traMADol  (ULTRAM ) 50 MG tablet, Take 1 tablet (50 mg total) by mouth every 6 (six) hours as needed., Disp: 15 tablet, Rfl: 0   Vitamin D , Ergocalciferol , (DRISDOL) 1.25 MG (50000 UNIT) CAPS capsule, Take 50,000 Units by mouth once a week., Disp: , Rfl:    pantoprazole  (PROTONIX ) 20 MG tablet, Take by mouth., Disp: , Rfl:    promethazine  (PHENERGAN ) 25 MG tablet, Take 1 tablet (25 mg total) by mouth every 6 (six) hours as needed., Disp: 30 tablet, Rfl: 0  Allergies  Allergen Reactions   Angiotensin Receptor Blockers Swelling    Angioedema   Meperidine Hives and Anaphylaxis   Strawberry Extract Hives and Swelling   Sucralfate  Nausea Only and Other (See Comments)    Severe nausea Other reaction(s): Other (See Comments) Severe nausea Severe nausea   Wasp Venom Hives    Objective:   BP 122/86 (BP Location: Left Arm, Patient Position: Sitting, Cuff Size: Normal)   Pulse 70   Temp 98 F (36.7 C) (Temporal)   Ht 6\' 1"  (1.854 m)   Wt 192 lb 12.8 oz (87.5 kg)   SpO2 95%   BMI 25.44 kg/m    Physical Exam Vitals reviewed.  Constitutional:      General: She is not in acute distress.    Appearance: Normal appearance. She is normal weight. She is not ill-appearing, toxic-appearing or diaphoretic.  HENT:     Head: Normocephalic.     Right Ear: A middle ear effusion is present. Tympanic membrane is erythematous.     Left Ear: Tympanic membrane normal.     Nose:     Right Turbinates: Enlarged.     Right Sinus: Maxillary sinus tenderness and frontal sinus tenderness present.     Left Sinus: Maxillary sinus tenderness and frontal sinus tenderness present.     Mouth/Throat:     Mouth: Mucous membranes are dry.     Pharynx: Posterior oropharyngeal erythema and postnasal drip present. No oropharyngeal  exudate.  Eyes:     Extraocular Movements: Extraocular movements intact.     Pupils: Pupils are equal, round, and reactive to light.  Cardiovascular:     Rate and Rhythm: Normal rate and regular rhythm.     Pulses: Normal pulses.     Heart sounds: Normal heart sounds.  Pulmonary:     Effort: Pulmonary effort is normal.     Breath sounds: Normal breath sounds.  Musculoskeletal:     Cervical back: Normal range of motion.  Lymphadenopathy:  Head:     Right side of head: Occipital adenopathy present.  Neurological:     General: No focal deficit present.     Mental Status: She is alert and oriented to person, place, and time. Mental status is at baseline.  Psychiatric:        Mood and Affect: Mood normal.        Behavior: Behavior normal.        Thought Content: Thought content normal.        Judgment: Judgment normal.     Assessment & Plan:  Acute recurrent maxillary sinusitis Assessment & Plan: Failure augmetnin and doxycycline  Rx levaquin  500 mg po every day for five days. Pt to continue tylenol /ibuprofen  prn sinus pain. Continue with humidifier prn and steam showers recommended as well. instructed If no symptom improvement in 48 hours please f/u [facility], sending to ENT for referral.    Orders: -     Ambulatory referral to ENT -     levoFLOXacin ; Take 1 tablet (500 mg total) by mouth daily for 5 days.  Dispense: 5 tablet; Refill: 0  Nausea -     Promethazine  HCl; Take 1 tablet (25 mg total) by mouth every 6 (six) hours as needed.  Dispense: 30 tablet; Refill: 0     Follow up plan: Return if symptoms worsen or fail to improve.  Felicita Horns, FNP

## 2024-05-19 NOTE — Assessment & Plan Note (Signed)
 Failure augmetnin and doxycycline  Rx levaquin  500 mg po every day for five days. Pt to continue tylenol /ibuprofen  prn sinus pain. Continue with humidifier prn and steam showers recommended as well. instructed If no symptom improvement in 48 hours please f/u [facility], sending to ENT for referral.

## 2024-05-21 ENCOUNTER — Other Ambulatory Visit: Payer: Self-pay | Admitting: Family

## 2024-05-21 DIAGNOSIS — F411 Generalized anxiety disorder: Secondary | ICD-10-CM

## 2024-05-21 DIAGNOSIS — R112 Nausea with vomiting, unspecified: Secondary | ICD-10-CM

## 2024-05-21 DIAGNOSIS — F331 Major depressive disorder, recurrent, moderate: Secondary | ICD-10-CM

## 2024-05-25 DIAGNOSIS — I509 Heart failure, unspecified: Secondary | ICD-10-CM | POA: Diagnosis not present

## 2024-05-25 DIAGNOSIS — J449 Chronic obstructive pulmonary disease, unspecified: Secondary | ICD-10-CM | POA: Diagnosis not present

## 2024-05-26 MED ORDER — HYDROXYZINE PAMOATE 50 MG PO CAPS: 50.0000 mg | ORAL_CAPSULE | Freq: Three times a day (TID) | ORAL | 0 refills | Status: DC | PRN

## 2024-05-26 NOTE — Addendum Note (Signed)
 Addended by: Felicita Horns on: 05/26/2024 08:45 PM   Modules accepted: Orders

## 2024-05-27 DIAGNOSIS — I509 Heart failure, unspecified: Secondary | ICD-10-CM | POA: Diagnosis not present

## 2024-05-27 DIAGNOSIS — J449 Chronic obstructive pulmonary disease, unspecified: Secondary | ICD-10-CM | POA: Diagnosis not present

## 2024-05-28 ENCOUNTER — Emergency Department
Admission: EM | Admit: 2024-05-28 | Discharge: 2024-05-28 | Disposition: A | Discharge status: Home / Self Care | Attending: Emergency Medicine | Admitting: Emergency Medicine

## 2024-05-28 ENCOUNTER — Other Ambulatory Visit: Payer: Self-pay | Admitting: Family

## 2024-05-28 ENCOUNTER — Other Ambulatory Visit: Payer: Self-pay

## 2024-05-28 DIAGNOSIS — N189 Chronic kidney disease, unspecified: Secondary | ICD-10-CM | POA: Insufficient documentation

## 2024-05-28 DIAGNOSIS — J449 Chronic obstructive pulmonary disease, unspecified: Secondary | ICD-10-CM | POA: Diagnosis not present

## 2024-05-28 DIAGNOSIS — T63441A Toxic effect of venom of bees, accidental (unintentional), initial encounter: Secondary | ICD-10-CM | POA: Insufficient documentation

## 2024-05-28 DIAGNOSIS — T782XXA Anaphylactic shock, unspecified, initial encounter: Secondary | ICD-10-CM | POA: Insufficient documentation

## 2024-05-28 DIAGNOSIS — I130 Hypertensive heart and chronic kidney disease with heart failure and stage 1 through stage 4 chronic kidney disease, or unspecified chronic kidney disease: Secondary | ICD-10-CM | POA: Insufficient documentation

## 2024-05-28 DIAGNOSIS — I509 Heart failure, unspecified: Secondary | ICD-10-CM | POA: Diagnosis not present

## 2024-05-28 DIAGNOSIS — I10 Essential (primary) hypertension: Secondary | ICD-10-CM | POA: Diagnosis not present

## 2024-05-28 DIAGNOSIS — L539 Erythematous condition, unspecified: Secondary | ICD-10-CM | POA: Diagnosis present

## 2024-05-28 DIAGNOSIS — F411 Generalized anxiety disorder: Secondary | ICD-10-CM

## 2024-05-28 MED ORDER — HYDROCORTISONE 1 % EX LOTN
1.0000 | TOPICAL_LOTION | Freq: Once | CUTANEOUS | Status: AC
  Administered 2024-05-28: 1 via TOPICAL
  Filled 2024-05-28: qty 118

## 2024-05-28 MED ORDER — EPINEPHRINE 0.3 MG/0.3ML IJ SOAJ: 0.3000 mg | INTRAMUSCULAR | 0 refills | Status: AC | PRN

## 2024-05-28 MED ORDER — HYDROXYZINE PAMOATE 50 MG PO CAPS: 50.0000 mg | ORAL_CAPSULE | Freq: Three times a day (TID) | ORAL | 2 refills | Status: DC | PRN

## 2024-05-28 MED ORDER — EPINEPHRINE 0.3 MG/0.3ML IJ SOAJ: 0.3000 mg | Freq: Once | INTRAMUSCULAR | Status: AC

## 2024-05-28 MED ORDER — FAMOTIDINE IN NACL 20-0.9 MG/50ML-% IV SOLN
20.0000 mg | Freq: Once | INTRAVENOUS | Status: AC
  Administered 2024-05-28: 20 mg via INTRAVENOUS
  Filled 2024-05-28: qty 50

## 2024-05-28 MED ORDER — DIPHENHYDRAMINE HCL 50 MG/ML IJ SOLN
50.0000 mg | Freq: Once | INTRAMUSCULAR | Status: AC
  Administered 2024-05-28: 50 mg via INTRAVENOUS
  Filled 2024-05-28: qty 1

## 2024-05-28 MED ORDER — METHYLPREDNISOLONE SODIUM SUCC 125 MG IJ SOLR
125.0000 mg | Freq: Once | INTRAMUSCULAR | Status: AC
  Administered 2024-05-28: 125 mg via INTRAVENOUS
  Filled 2024-05-28: qty 2

## 2024-05-28 MED ORDER — EPINEPHRINE 0.3 MG/0.3ML IJ SOAJ
INTRAMUSCULAR | Status: AC
  Administered 2024-05-28: 0.3 mg via INTRAMUSCULAR
  Filled 2024-05-28: qty 0.3

## 2024-05-28 NOTE — Addendum Note (Signed)
 Addended by: Felicita Horns on: 05/28/2024 05:13 PM   Modules accepted: Orders

## 2024-05-28 NOTE — ED Notes (Signed)
 Pt ambulated to and from restroom without difficulty, nadn

## 2024-05-28 NOTE — ED Provider Notes (Signed)
 [facility] Provider Note    Event Date/Time   First MD Initiated Contact with Patient 05/28/24 1653     (approximate)   History   Chief Complaint Allergic Reaction   HPI  Morgan [name] is a 73 y.o. female with past medical history of hypertension, anemia, CHF, CKD, and COPD who presents to the ED complaining of allergic reaction.  Patient reports that about 30 minutes prior to arrival she was stung by a bee on her left hand.  Shortly afterwards, she began to feel like her throat was swelling with some difficulty breathing, similar to prior reactions that she has experienced with a bee sting.  She took her home EpiPen  about 20 minutes prior to arrival, has not had any improvement and sensation of throat swelling since then.  She reports feeling short of breath with some lightheadedness and nausea, has not had any vomiting or diarrhea.  She denies any pain in her chest and has not noticed any rash other than the redness and swelling around the area of bee sting.     Physical Exam   Triage Vital Signs: ED Triage Vitals  Encounter Vitals Group     BP 05/28/24 1652 (!) 151/81     Girls Systolic BP Percentile --      Girls Diastolic BP Percentile --      Boys Systolic BP Percentile --      Boys Diastolic BP Percentile --      Pulse Rate 05/28/24 1652 80     Resp 05/28/24 1652 (!) 22     Temp 05/28/24 1652 98 F (36.7 C)     Temp Source 05/28/24 1652 Oral     SpO2 05/28/24 1652 100 %     Weight --      Height 05/28/24 1650 6' 1 (1.854 m)     Head Circumference --      Peak Flow --      Pain Score 05/28/24 1650 0     Pain Loc --      Pain Education --      Exclude from Growth Chart --     Most recent vital signs: Vitals:   05/28/24 2000 05/28/24 2130  BP: 138/75 137/76  Pulse:    Resp: 17 17  Temp:    SpO2:      Constitutional: Alert and oriented. Eyes: Conjunctivae are normal. Head: Atraumatic. Nose: No  congestion/rhinnorhea. Mouth/Throat: Mucous membranes are moist.  No oropharyngeal or tongue edema noted. Cardiovascular: Normal rate, regular rhythm. Grossly normal heart sounds.  2+ radial pulses bilaterally. Respiratory: Normal respiratory effort.  No retractions. Lungs CTAB. Gastrointestinal: Soft and nontender. No distention. Musculoskeletal: No lower extremity tenderness nor edema.  Neurologic:  Normal speech and language. No gross focal neurologic deficits are appreciated.    ED Results / Procedures / Treatments   Labs (all labs ordered are listed, but only abnormal results are displayed) Labs Reviewed - No data to display   EKG  ED ECG REPORT I, Twilla Galea, the attending physician, personally viewed and interpreted this ECG.   Date: 05/28/2024  EKG Time: 17:05  Rate: 73  Rhythm: normal sinus rhythm  Axis: LAD  Intervals:none  ST&T Change: None  PROCEDURES:  Critical Care performed: No  Procedures   MEDICATIONS ORDERED IN ED: Medications  diphenhydrAMINE  (BENADRYL ) injection 50 mg (50 mg Intravenous Given 05/28/24 1707)  famotidine  (PEPCID ) IVPB 20 mg premix (0 mg Intravenous Stopped 05/28/24 1827)  methylPREDNISolone  sodium succinate (  SOLU-MEDROL ) 125 mg/2 mL injection 125 mg (125 mg Intravenous Given 05/28/24 1707)  EPINEPHrine  (EPI-PEN) injection 0.3 mg (0.3 mg Intramuscular Given 05/28/24 1702)  hydrocortisone  1 % lotion 1 Application (1 Application Topical Given 05/28/24 1826)     IMPRESSION / MDM / ASSESSMENT AND PLAN / ED COURSE  I reviewed the triage vital signs and the nursing notes.                              73 y.o. female with past medical history of hypertension, anemia, CHF, COPD, and CKD who presents to the ED complaining of sensation of throat swelling after being stung by a bee about 30 minutes prior to arrival.  Patient's presentation is most consistent with acute presentation with potential threat to life or bodily  function.  Differential diagnosis includes, but is not limited to, anaphylaxis, allergic reaction, angioedema.  Patient nontoxic-appearing and in no acute distress, vital signs are unremarkable.  No obvious swelling on exam but patient reports ongoing sensation that her throat is swelling with difficulty breathing following bee sting.  She already administered a dose of epinephrine  at home but will give follow-up dose given ongoing symptoms concerning for anaphylaxis.  We will give dose of IV Benadryl , Solu-Medrol , and Pepcid .  Plan to observe here in the ED for at least 4 hours.  Patient with improving symptoms following second dose of epinephrine  and additional medications.  She was observed for greater than 4 hours with no recurrent anaphylaxis.  She is appropriate for discharge home with outpatient follow-up, refill of EpiPen  was provided.  She was counseled to return to the ED for new or worsening symptoms, patient agrees with plan.      FINAL CLINICAL IMPRESSION(S) / ED DIAGNOSES   Final diagnoses:  Anaphylaxis, initial encounter  Bee sting, accidental or unintentional, initial encounter     Rx / DC Orders   ED Discharge Orders          Ordered    EPINEPHrine  0.3 mg/0.3 mL IJ SOAJ injection  As needed        05/28/24 2151             Note:  This document was prepared using Dragon voice recognition software and may include unintentional dictation errors.   Twilla Galea, MD 05/28/24 2153

## 2024-05-28 NOTE — ED Triage Notes (Addendum)
 Pt to ed from home via POV for allergic reaction. Pt was in yard and she got stung on her left index finger. Pt is allergic to bees. Pt took her own epi pen. Pt states I feel like my throat is closing up and I am hoarse and losing my voice. Pt having a hard time catching her breath.

## 2024-05-28 NOTE — ED Notes (Signed)
 Pt reporting that she no longer feels like her throat is closing. Pt reporting she feels like the swelling is getting better.

## 2024-05-31 ENCOUNTER — Other Ambulatory Visit: Payer: Self-pay | Admitting: Family

## 2024-05-31 DIAGNOSIS — F4323 Adjustment disorder with mixed anxiety and depressed mood: Secondary | ICD-10-CM

## 2024-06-01 ENCOUNTER — Other Ambulatory Visit: Payer: Self-pay

## 2024-06-01 DIAGNOSIS — G43419 Hemiplegic migraine, intractable, without status migrainosus: Secondary | ICD-10-CM

## 2024-06-06 ENCOUNTER — Ambulatory Visit
Admission: RE | Admit: 2024-06-06 | Discharge: 2024-06-06 | Disposition: A | Discharge status: Home / Self Care | Source: Ambulatory Visit

## 2024-06-06 DIAGNOSIS — G43419 Hemiplegic migraine, intractable, without status migrainosus: Secondary | ICD-10-CM

## 2024-06-09 ENCOUNTER — Other Ambulatory Visit: Payer: Self-pay | Admitting: Family

## 2024-06-09 DIAGNOSIS — F411 Generalized anxiety disorder: Secondary | ICD-10-CM

## 2024-06-15 DIAGNOSIS — M7918 Myalgia, other site: Secondary | ICD-10-CM | POA: Diagnosis not present

## 2024-06-15 DIAGNOSIS — M5481 Occipital neuralgia: Secondary | ICD-10-CM | POA: Diagnosis not present

## 2024-06-18 DIAGNOSIS — R1030 Lower abdominal pain, unspecified: Secondary | ICD-10-CM | POA: Diagnosis not present

## 2024-06-18 DIAGNOSIS — R197 Diarrhea, unspecified: Secondary | ICD-10-CM | POA: Diagnosis not present

## 2024-06-22 DIAGNOSIS — Z0100 Encounter for examination of eyes and vision without abnormal findings: Secondary | ICD-10-CM | POA: Diagnosis not present

## 2024-06-24 DIAGNOSIS — I509 Heart failure, unspecified: Secondary | ICD-10-CM | POA: Diagnosis not present

## 2024-06-24 DIAGNOSIS — J449 Chronic obstructive pulmonary disease, unspecified: Secondary | ICD-10-CM | POA: Diagnosis not present

## 2024-06-26 DIAGNOSIS — J449 Chronic obstructive pulmonary disease, unspecified: Secondary | ICD-10-CM | POA: Diagnosis not present

## 2024-06-26 DIAGNOSIS — I509 Heart failure, unspecified: Secondary | ICD-10-CM | POA: Diagnosis not present

## 2024-06-29 ENCOUNTER — Inpatient Hospital Stay: Payer: Medicare HMO | Attending: Oncology

## 2024-06-29 DIAGNOSIS — D509 Iron deficiency anemia, unspecified: Secondary | ICD-10-CM | POA: Insufficient documentation

## 2024-06-29 DIAGNOSIS — Z8041 Family history of malignant neoplasm of ovary: Secondary | ICD-10-CM | POA: Insufficient documentation

## 2024-06-29 DIAGNOSIS — N183 Chronic kidney disease, stage 3 unspecified: Secondary | ICD-10-CM | POA: Insufficient documentation

## 2024-06-29 DIAGNOSIS — Z808 Family history of malignant neoplasm of other organs or systems: Secondary | ICD-10-CM | POA: Insufficient documentation

## 2024-06-29 DIAGNOSIS — Z803 Family history of malignant neoplasm of breast: Secondary | ICD-10-CM | POA: Insufficient documentation

## 2024-06-29 DIAGNOSIS — D508 Other iron deficiency anemias: Secondary | ICD-10-CM

## 2024-06-29 DIAGNOSIS — Z800 Family history of malignant neoplasm of digestive organs: Secondary | ICD-10-CM | POA: Diagnosis not present

## 2024-06-29 DIAGNOSIS — Z87891 Personal history of nicotine dependence: Secondary | ICD-10-CM | POA: Diagnosis not present

## 2024-06-29 LAB — FERRITIN: Ferritin: 254 ng/mL (ref 11–307)

## 2024-06-29 LAB — CMP (CANCER CENTER ONLY)
ALT: 23 U/L (ref 0–44)
AST: 29 U/L (ref 15–41)
Albumin: 3.9 g/dL (ref 3.5–5.0)
Alkaline Phosphatase: 95 U/L (ref 38–126)
Anion gap: 9 (ref 5–15)
BUN: 29 mg/dL — ABNORMAL HIGH (ref 8–23)
CO2: 21 mmol/L — ABNORMAL LOW (ref 22–32)
Calcium: 8.9 mg/dL (ref 8.9–10.3)
Chloride: 109 mmol/L (ref 98–111)
Creatinine: 1.16 mg/dL — ABNORMAL HIGH (ref 0.44–1.00)
GFR, Estimated: 50 mL/min — ABNORMAL LOW (ref >60)
Glucose, Bld: 113 mg/dL — ABNORMAL HIGH (ref 70–99)
Potassium: 3.8 mmol/L (ref 3.5–5.1)
Sodium: 139 mmol/L (ref 135–145)
Total Bilirubin: 0.5 mg/dL (ref 0.0–1.2)
Total Protein: 6.8 g/dL (ref 6.5–8.1)

## 2024-06-29 LAB — CBC WITH DIFFERENTIAL (CANCER CENTER ONLY)
Abs Immature Granulocytes: 0.1 K/uL — ABNORMAL HIGH (ref 0.00–0.07)
Basophils Absolute: 0 K/uL (ref 0.0–0.1)
Basophils Relative: 0 %
Eosinophils Absolute: 0.2 K/uL (ref 0.0–0.5)
Eosinophils Relative: 2 %
HCT: 37.2 % (ref 36.0–46.0)
Hemoglobin: 11.7 g/dL — ABNORMAL LOW (ref 12.0–15.0)
Immature Granulocytes: 1 %
Lymphocytes Relative: 20 %
Lymphs Abs: 2 K/uL (ref 0.7–4.0)
MCH: 27.3 pg (ref 26.0–34.0)
MCHC: 31.5 g/dL (ref 30.0–36.0)
MCV: 86.7 fL (ref 80.0–100.0)
Monocytes Absolute: 0.4 K/uL (ref 0.1–1.0)
Monocytes Relative: 4 %
Neutro Abs: 7.5 K/uL (ref 1.7–7.7)
Neutrophils Relative %: 73 %
Platelet Count: 244 K/uL (ref 150–400)
RBC: 4.29 MIL/uL (ref 3.87–5.11)
RDW: 14.5 % (ref 11.5–15.5)
WBC Count: 10.2 K/uL (ref 4.0–10.5)
nRBC: 0 % (ref 0.0–0.2)

## 2024-06-29 LAB — RETIC PANEL
Immature Retic Fract: 4 % (ref 2.3–15.9)
RBC.: 4.26 MIL/uL (ref 3.87–5.11)
Retic Count, Absolute: 50.3 K/uL (ref 19.0–186.0)
Retic Ct Pct: 1.2 % (ref 0.4–3.1)
Reticulocyte Hemoglobin: 29.7 pg (ref >27.9)

## 2024-06-29 LAB — IRON AND TIBC
Iron: 40 ug/dL (ref 28–170)
Saturation Ratios: 13 % (ref 10.4–31.8)
TIBC: 298 ug/dL (ref 250–450)
UIBC: 258 ug/dL

## 2024-07-01 ENCOUNTER — Inpatient Hospital Stay: Payer: Medicare HMO

## 2024-07-01 ENCOUNTER — Inpatient Hospital Stay: Payer: Medicare HMO | Admitting: Oncology

## 2024-07-01 ENCOUNTER — Encounter: Payer: Self-pay | Admitting: Oncology

## 2024-07-01 VITALS — BP 145/88 | HR 74 | Temp 97.2°F | Resp 18

## 2024-07-01 DIAGNOSIS — N1831 Chronic kidney disease, stage 3a: Secondary | ICD-10-CM | POA: Diagnosis not present

## 2024-07-01 DIAGNOSIS — N183 Chronic kidney disease, stage 3 unspecified: Secondary | ICD-10-CM | POA: Diagnosis not present

## 2024-07-01 DIAGNOSIS — Z808 Family history of malignant neoplasm of other organs or systems: Secondary | ICD-10-CM | POA: Diagnosis not present

## 2024-07-01 DIAGNOSIS — Z803 Family history of malignant neoplasm of breast: Secondary | ICD-10-CM | POA: Diagnosis not present

## 2024-07-01 DIAGNOSIS — D508 Other iron deficiency anemias: Secondary | ICD-10-CM

## 2024-07-01 DIAGNOSIS — Z87891 Personal history of nicotine dependence: Secondary | ICD-10-CM | POA: Diagnosis not present

## 2024-07-01 DIAGNOSIS — D509 Iron deficiency anemia, unspecified: Secondary | ICD-10-CM | POA: Diagnosis not present

## 2024-07-01 DIAGNOSIS — Z800 Family history of malignant neoplasm of digestive organs: Secondary | ICD-10-CM | POA: Diagnosis not present

## 2024-07-01 DIAGNOSIS — Z8041 Family history of malignant neoplasm of ovary: Secondary | ICD-10-CM | POA: Diagnosis not present

## 2024-07-01 NOTE — Assessment & Plan Note (Signed)
 Iron  deficiency anemia in the context of chronic kidney disease.  Labs are reviewed and discussed with patient. Lab Results  Component Value Date   HGB 11.7 (L) 06/29/2024   TIBC 298 06/29/2024   IRONPCTSAT 13 06/29/2024   FERRITIN 254 06/29/2024   No need for IV Venofer .

## 2024-07-01 NOTE — Assessment & Plan Note (Signed)
 Encourage oral hydration and avoid nephrotoxins.

## 2024-07-01 NOTE — Progress Notes (Signed)
 Hematology/Oncology Progress note [phone] [phone]      Patient Care Team: Corwin Antu, FNP as PCP - General (Family Medicine) Fate Morna SAILOR, [facility] (Inactive) as Pharmacist (Pharmacist) Babara Call, MD as Consulting Physician (Hematology and Oncology) Maree Jannett POUR, MD as Consulting Physician (Neurology) Ermalinda Lenn HERO, LCSW as Social Worker  CHIEF COMPLAINTS/REASON FOR VISIT:  Follow-up for iron  deficiency anemia, CKD.  ASSESSMENT & PLAN:   IDA (iron  deficiency anemia) Iron  deficiency anemia in the context of chronic kidney disease.  Labs are reviewed and discussed with patient. Lab Results  Component Value Date   HGB 11.7 (L) 06/29/2024   TIBC 298 06/29/2024   IRONPCTSAT 13 06/29/2024   FERRITIN 254 06/29/2024   No need for IV Venofer .     CKD (chronic kidney disease) stage 3, GFR 30-59 ml/min (HCC) Encourage oral hydration and avoid nephrotoxins.     Orders Placed This Encounter  Procedures   CBC with Differential (Cancer Center Only)    Standing Status:   Future    Expected Date:   01/01/2025    Expiration Date:   04/01/2025   CMP (Cancer Center only)    Standing Status:   Future    Expected Date:   01/01/2025    Expiration Date:   04/01/2025   Iron  and TIBC    Standing Status:   Future    Expected Date:   01/01/2025    Expiration Date:   04/01/2025   Ferritin    Standing Status:   Future    Expected Date:   01/01/2025    Expiration Date:   04/01/2025   Retic Panel    Standing Status:   Future    Expected Date:   01/01/2025    Expiration Date:   04/01/2025   Vitamin B12    Standing Status:   Future    Expected Date:   01/01/2025    Expiration Date:   04/01/2025   Follow up in 6 months All questions were answered. The patient knows to call the clinic with any problems, questions or concerns.  Call Babara, MD, PhD [facility] Hematology Oncology 07/01/2024   HISTORY OF PRESENTING ILLNESS:   Morgan [name] is a  73 y.o.   female with PMH listed below was seen in consultation at the request of  Corwin Antu, FNP  for evaluation of anemia Patient had blood work done recently on 08/29/2021. CBC showed a hemoglobin of 9.9, MCV 75.5. Patient denies any black stool or blood in the stool. She has a history of gastric stenosis for which she got EGD dilation periodically.  Last EGD was 08/11/2020. Patient reports feeling tired and fatigued. She has taken oral iron  supplementation before.  Iron  has made her constipation worse.  Colonoscopy was done on 08/20/23 results were reviewed.  INTERVAL HISTORY Morgan [name] is a 73 y.o. female who has above history reviewed by me today presents for follow up visit for management of iron  deficiency anemia and CKD.  Patient feels well at baseline.  Denies any bleeding events.     Review of Systems  Constitutional:  Positive for fatigue. Negative for appetite change, chills and fever.  HENT:   Negative for hearing loss and voice change.   Eyes:  Negative for eye problems.  Respiratory:  Negative for chest tightness and cough.   Cardiovascular:  Negative for chest pain.  Gastrointestinal:  Negative for abdominal distention, abdominal pain and blood in stool.  Endocrine: Negative for hot flashes.  Genitourinary:  Negative for difficulty urinating and frequency.   Musculoskeletal:  Negative for arthralgias.  Skin:  Negative for itching and rash.  Neurological:  Negative for extremity weakness.  Hematological:  Negative for adenopathy.  Psychiatric/Behavioral:  Negative for confusion.     MEDICAL HISTORY:  Past Medical History:  Diagnosis Date   Allergy    Anemia    Anxiety    Arthritis    CHF (congestive heart failure) (HCC)    Chronic kidney disease    COPD (chronic obstructive pulmonary disease) (HCC)    Depression    GERD (gastroesophageal reflux disease)    Headache    Hyperlipidemia    Hypertension    MVP (mitral valve prolapse)    Parkinson disease (HCC)     Vascular dementia (HCC)     SURGICAL HISTORY: Past Surgical History:  Procedure Laterality Date   ABDOMINAL HYSTERECTOMY     APPENDECTOMY     BALLOON DILATION N/A 04/21/2020   Procedure: BALLOON DILATION;  Surgeon: Teressa Toribio SQUIBB, MD;  Location: [facility];  Service: Endoscopy;  Laterality: N/A;  pyloric/ GI   BALLOON DILATION N/A 07/04/2020   Procedure: BALLOON DILATION;  Surgeon: Shila Gustav GAILS, MD;  Location: [facility];  Service: Endoscopy;  Laterality: N/A;   BIOPSY  04/21/2020   Procedure: BIOPSY;  Surgeon: Teressa Toribio SQUIBB, MD;  Location: [facility];  Service: Endoscopy;;   BIOPSY  07/04/2020   Procedure: BIOPSY;  Surgeon: Shila Gustav GAILS, MD;  Location: [facility];  Service: Endoscopy;;  EGD and COLON   BREAST SURGERY Right 1988   tumor removal   CHOLECYSTECTOMY N/A 05/18/2020   Procedure: LAPAROSCOPIC CHOLECYSTECTOMY;  Surgeon: Gladis Cough, MD;  Location: [facility];  Service: General;  Laterality: N/A;   COLONOSCOPY WITH PROPOFOL  N/A 07/04/2020   Procedure: COLONOSCOPY WITH PROPOFOL ;  Surgeon: Shila Gustav GAILS, MD;  Location: [facility];  Service: Endoscopy;  Laterality: N/A;   COLONOSCOPY WITH PROPOFOL  N/A 11/03/2021   Procedure: COLONOSCOPY WITH PROPOFOL ;  Surgeon: Maryruth Ole DASEN, MD;  Location: [facility];  Service: Endoscopy;  Laterality: N/A;   COLONOSCOPY WITH PROPOFOL  N/A 08/20/2023   Procedure: COLONOSCOPY WITH PROPOFOL ;  Surgeon: Maryruth Ole DASEN, MD;  Location: [facility];  Service: Endoscopy;  Laterality: N/A;   ESOPHAGEAL DILATION  08/11/2020   Procedure: PYLORIC DILATION;  Surgeon: Legrand Victory LITTIE DOUGLAS, MD;  Location: [facility];  Service: Gastroenterology;;   ESOPHAGOGASTRODUODENOSCOPY (EGD) WITH PROPOFOL  N/A 04/21/2020   Procedure: ESOPHAGOGASTRODUODENOSCOPY (EGD) WITH PROPOFOL ;  Surgeon: Teressa Toribio SQUIBB, MD;  Location: [facility];  Service: Endoscopy;  Laterality: N/A;   ESOPHAGOGASTRODUODENOSCOPY (EGD) WITH PROPOFOL  N/A 07/04/2020    Procedure: ESOPHAGOGASTRODUODENOSCOPY (EGD) WITH PROPOFOL ;  Surgeon: Shila Gustav GAILS, MD;  Location: [facility];  Service: Endoscopy;  Laterality: N/A;   ESOPHAGOGASTRODUODENOSCOPY (EGD) WITH PROPOFOL  N/A 08/11/2020   Procedure: ESOPHAGOGASTRODUODENOSCOPY (EGD) WITH PROPOFOL ;  Surgeon: Legrand Victory LITTIE DOUGLAS, MD;  Location: [facility];  Service: Gastroenterology;  Laterality: N/A;   ESOPHAGOGASTRODUODENOSCOPY (EGD) WITH PROPOFOL  N/A 11/03/2021   Procedure: ESOPHAGOGASTRODUODENOSCOPY (EGD) WITH PROPOFOL ;  Surgeon: Maryruth Ole DASEN, MD;  Location: [facility];  Service: Endoscopy;  Laterality: N/A;   ESOPHAGOGASTRODUODENOSCOPY (EGD) WITH PROPOFOL  N/A 05/07/2022   Procedure: ESOPHAGOGASTRODUODENOSCOPY (EGD) WITH PROPOFOL ;  Surgeon: Unk Corinn Skiff, MD;  Location: [facility];  Service: Gastroenterology;  Laterality: N/A;   HEMOSTASIS CLIP PLACEMENT  08/20/2023   Procedure: HEMOSTASIS CLIP PLACEMENT;  Surgeon: Maryruth Ole DASEN, MD;  Location: [facility];  Service: Endoscopy;;   KNEE SURGERY  2005   2 times left   KNEE SURGERY Right    KYPHOPLASTY N/A 07/20/2021   Procedure: T 10KYPHOPLASTY;  Surgeon: Kathlynn Sharper, MD;  Location: [facility];  Service: Orthopedics;  Laterality: N/A;   KYPHOPLASTY N/A 08/23/2021   Procedure: T9 KYPHOPLASTY;  Surgeon: Kathlynn Sharper, MD;  Location: [facility];  Service: Orthopedics;  Laterality: N/A;   POLYPECTOMY  07/04/2020   Procedure: POLYPECTOMY;  Surgeon: Shila Gustav GAILS, MD;  Location: [facility];  Service: Endoscopy;;   POLYPECTOMY  08/20/2023   Procedure: POLYPECTOMY;  Surgeon: Maryruth Ole DASEN, MD;  Location: [facility];  Service: Endoscopy;;   SUBMUCOSAL TATTOO INJECTION  08/20/2023   Procedure: SUBMUCOSAL TATTOO INJECTION;  Surgeon: Maryruth Ole DASEN, MD;  Location: [facility];  Service: Endoscopy;;   TUBAL LIGATION      SOCIAL HISTORY: Social History   Socioeconomic History   Marital status: Single    Spouse name: Not  on file   Number of children: 2   Years of education: Not on file   Highest education level: Some college, no degree  Occupational History   Not on file  Tobacco Use   Smoking status: Former    Current packs/day: 0.00    Average packs/day: 1 pack/day for 15.0 years (15.0 ttl pk-yrs)    Types: Cigarettes    Start date: 09/12/2002    Quit date: 09/12/2017    Years since quitting: 6.8   Smokeless tobacco: Never  Vaping Use   Vaping status: Never Used  Substance and Sexual Activity   Alcohol  use: Never   Drug use: No   Sexual activity: Not Currently  Other Topics Concern   Not on file  Social History Narrative   Lives with Grandson's great grandparents    Grandson - 52 years old ([place])   Daughter - Bruno - [place]   Support system - family, aunt, Inocente and Christopher (who she lives with), brother   Transportation: roommates   Enjoy: [employer], playing with grandson   Exercise: PT exercises   Diet: good - chicken, some steak, tries to eat healthy, avoids fried foods   Retired from being a Lawyer   Social Drivers of Corporate investment banker Strain: Low Risk  (02/03/2024)   Overall Financial Resource Strain (CARDIA)    Difficulty of Paying Living Expenses: Not very hard  Food Insecurity: No Food Insecurity (02/03/2024)   Hunger Vital Sign    Worried About Running Out of Food in the Last Year: Never true    Ran Out of Food in the Last Year: Never true  Transportation Needs: No Transportation Needs (02/03/2024)   PRAPARE - Administrator, Civil Service (Medical): No    Lack of Transportation (Non-Medical): No  Physical Activity: Insufficiently Active (02/03/2024)   Exercise Vital Sign    Days of Exercise per Week: 2 days    Minutes of Exercise per Session: 30 min  Stress: Stress Concern Present (02/03/2024)   [employer] of Occupational Health - Occupational Stress Questionnaire    Feeling of Stress : To some extent  Social Connections:  Moderately Isolated (02/03/2024)   Social Connection and Isolation Panel    Frequency of Communication with Friends and Family: More than three times a week    Frequency of Social Gatherings with Friends and Family: Once a week    Attends Religious Services: More than 4 times per year    Active Member of Clubs or Organizations: No  Attends Banker Meetings: Never    Marital Status: Divorced  Catering manager Violence: Not At Risk (11/19/2023)   Humiliation, Afraid, Rape, and Kick questionnaire    Fear of Current or Ex-Partner: No    Emotionally Abused: No    Physically Abused: No    Sexually Abused: No    FAMILY HISTORY: Family History  Problem Relation Age of Onset   Breast cancer Mother 38   Ovarian cancer Mother 21   Brain cancer Mother    Hypertension Father    Hyperlipidemia Father    Heart disease Father    Colon cancer Father        diagnosed in his 23s   Breast cancer Maternal Grandmother 62   Diabetes Paternal Grandmother    Breast cancer Paternal Grandmother 27   Other Son        drug over dose   Colon cancer Maternal Grandfather        dx in his 83s   Liver disease Maternal Grandfather    Breast cancer Maternal Aunt 60   Breast cancer Maternal Aunt 60   Colon cancer Maternal Uncle    Stomach cancer Neg Hx    Pancreatic cancer Neg Hx    Esophageal cancer Neg Hx     ALLERGIES:  is allergic to angiotensin receptor blockers, meperidine, strawberry extract, sucralfate , and wasp venom.  MEDICATIONS:  Current Outpatient Medications  Medication Sig Dispense Refill   acetaminophen  (TYLENOL ) 325 MG tablet Take 2 tablets (650 mg total) by mouth every 4 (four) hours as needed for mild pain (or temp > 37.5 C (99.5 F)).     albuterol  (VENTOLIN  HFA) 108 (90 Base) MCG/ACT inhaler Inhale 2 puffs into the lungs every 6 (six) hours as needed for wheezing or shortness of breath. 1 each 11   Budeson-Glycopyrrol-Formoterol  (BREZTRI  AEROSPHERE) 160-9-4.8 MCG/ACT  AERO Inhale 2 puffs into the lungs in the morning and at bedtime. 1 each 11   busPIRone  (BUSPAR ) 5 MG tablet TAKE 1 TABLET BY MOUTH TWICE A DAY 180 tablet 1   carbidopa -levodopa  (SINEMET  IR) 25-100 MG tablet Take 2 tablets by mouth 3 (three) times daily.     cyanocobalamin  (VITAMIN B12) 100 MCG tablet Take 100 mcg by mouth daily.     diclofenac  Sodium (VOLTAREN  ARTHRITIS PAIN) 1 % GEL Apply 4 g topically 4 (four) times daily. 100 g 0   EPINEPHrine  0.3 mg/0.3 mL IJ SOAJ injection Inject 0.3 mg into the muscle as needed for anaphylaxis. 1 each 0   fluticasone  (FLONASE ) 50 MCG/ACT nasal spray SPRAY 2 SPRAYS INTO EACH NOSTRIL EVERY DAY 48 mL 2   folic acid  (FOLVITE ) 1 MG tablet TAKE 1 TABLET BY MOUTH EVERY DAY 90 tablet 1   hydrocortisone  (ANUSOL -HC) 25 MG suppository Place 1 suppository (25 mg total) rectally 2 (two) times daily. 12 suppository 0   hydrOXYzine  (VISTARIL ) 50 MG capsule TAKE 1 CAPSULE BY MOUTH 3 TIMES DAILY AS NEEDED. 270 capsule 1   ibuprofen  (ADVIL ) 400 MG tablet Take 1 tablet (400 mg total) by mouth every 8 (eight) hours as needed for headache or moderate pain. 20 tablet 0   KLOR-CON  M20 20 MEQ tablet TAKE 1 TABLET BY MOUTH EVERY DAY 90 tablet 1   lamoTRIgine (LAMICTAL) 25 MG tablet Take 25 mg by mouth.     LORazepam  (ATIVAN ) 0.5 MG tablet Take 1 tablet (0.5 mg total) by mouth at bedtime as needed for anxiety. 30 tablet 0   meloxicam  (MOBIC ) 15 MG tablet  TAKE 1 TABLET EVERY DAY BY ORAL ROUTE WITH MEAL(S).     NUCALA 100 MG/ML SOAJ      OXYGEN  Inhale 3 L/min into the lungs See admin instructions. 3 L/min at bedtime and as needed for shortness of breath throughout the day     pantoprazole  (PROTONIX ) 20 MG tablet Take by mouth.     promethazine  (PHENERGAN ) 25 MG tablet Take 1 tablet (25 mg total) by mouth every 6 (six) hours as needed. 30 tablet 0   sertraline  (ZOLOFT ) 100 MG tablet Take two tablets once daily for total 200 mg daily 180 tablet 3   tiZANidine  (ZANAFLEX ) 4 MG tablet  Take 1 tablet (4 mg total) by mouth every 8 (eight) hours as needed (muscular pain). 20 tablet 0   topiramate  (TOPAMAX ) 50 MG tablet TAKE 1 TABLET BY MOUTH EVERYDAY AT BEDTIME 90 tablet 1   traMADol  (ULTRAM ) 50 MG tablet Take 1 tablet (50 mg total) by mouth every 6 (six) hours as needed. 15 tablet 0   Vitamin D , Ergocalciferol , (DRISDOL) 1.25 MG (50000 UNIT) CAPS capsule Take 50,000 Units by mouth once a week.     No current facility-administered medications for this visit.     PHYSICAL EXAMINATION: ECOG PERFORMANCE STATUS: 1 - Symptomatic but completely ambulatory Vitals:   07/01/24 1340  BP: (!) 145/88  Pulse: 74  Resp: 18  Temp: (!) 97.2 F (36.2 C)  SpO2: 100%   There were no vitals filed for this visit.   Physical Exam Constitutional:      General: She is not in acute distress. HENT:     Head: Normocephalic and atraumatic.  Eyes:     General: No scleral icterus. Cardiovascular:     Rate and Rhythm: Normal rate and regular rhythm.     Heart sounds: Normal heart sounds.  Pulmonary:     Effort: Pulmonary effort is normal. No respiratory distress.     Breath sounds: No wheezing.  Abdominal:     General: Bowel sounds are normal. There is no distension.     Palpations: Abdomen is soft.  Musculoskeletal:        General: No deformity. Normal range of motion.     Cervical back: Normal range of motion and neck supple.  Skin:    General: Skin is warm and dry.     Findings: No erythema or rash.  Neurological:     Mental Status: She is alert and oriented to person, place, and time. Mental status is at baseline.     Cranial Nerves: No cranial nerve deficit.     Coordination: Coordination normal.  Psychiatric:        Mood and Affect: Mood normal.     LABORATORY DATA:  I have reviewed the data as listed     Latest Ref Rng & Units 06/29/2024    1:40 PM 12/31/2023    1:31 PM 12/06/2023   10:58 AM  CBC  WBC 4.0 - 10.5 K/uL 10.2  6.1  6.8   Hemoglobin 12.0 - 15.0 g/dL  88.2  88.6  88.2   Hematocrit 36.0 - 46.0 % 37.2  36.5  36.0   Platelets 150 - 400 K/uL 244  227  269.0       Latest Ref Rng & Units 06/29/2024    1:40 PM 04/06/2024    9:35 AM 12/31/2023    1:31 PM  CMP  Glucose 70 - 99 mg/dL 886  75  878   BUN 8 - 23 mg/dL  29  23  33   Creatinine 0.44 - 1.00 mg/dL 8.83  9.06  8.82   Sodium 135 - 145 mmol/L 139  142  139   Potassium 3.5 - 5.1 mmol/L 3.8  3.6  4.6   Chloride 98 - 111 mmol/L 109  107  107   CO2 22 - 32 mmol/L 21  26  23    Calcium  8.9 - 10.3 mg/dL 8.9  8.8  8.9   Total Protein 6.5 - 8.1 g/dL 6.8   6.9   Total Bilirubin 0.0 - 1.2 mg/dL 0.5   0.5   Alkaline Phos 38 - 126 U/L 95   89   AST 15 - 41 U/L 29   16   ALT 0 - 44 U/L 23   11     Iron /TIBC/Ferritin/ %Sat    Component Value Date/Time   IRON  40 06/29/2024 1340   TIBC 298 06/29/2024 1340   FERRITIN 254 06/29/2024 1340   IRONPCTSAT 13 06/29/2024 1340       RADIOGRAPHIC STUDIES: I have personally reviewed the radiological images as listed and agreed with the findings in the report. MR Venogram Head Result Date: 06/06/2024 CLINICAL DATA:  Intractable hemiplegic migraines. EXAM: MR VENOGRAM HEAD WITHOUT CONTRAST TECHNIQUE: Angiographic images of the intracranial venous structures were acquired using MRV technique without intravenous contrast. COMPARISON:  MR head without contrast 06/25/2023. FINDINGS: The dural sinuses are patent. The straight sinus and deep cerebral veins are intact. Cortical veins are within normal limits. No significant vascular malformation is evident. The right transverse sinus is dominant. IMPRESSION: Normal MRV of the head. Electronically Signed   By: Lonni Necessary M.D.   On: 06/06/2024 07:44

## 2024-07-18 ENCOUNTER — Ambulatory Visit
Admission: EM | Admit: 2024-07-18 | Discharge: 2024-07-18 | Disposition: A | Discharge status: Home / Self Care | Attending: Internal Medicine | Admitting: Internal Medicine

## 2024-07-18 DIAGNOSIS — N3000 Acute cystitis without hematuria: Secondary | ICD-10-CM | POA: Diagnosis not present

## 2024-07-18 LAB — POCT URINE DIPSTICK
Bilirubin, UA: NEGATIVE
Glucose, UA: NEGATIVE mg/dL
Ketones, POC UA: NEGATIVE mg/dL
Nitrite, UA: POSITIVE — AB
POC PROTEIN,UA: NEGATIVE
Spec Grav, UA: 1.02 (ref 1.010–1.025)
Urobilinogen, UA: 0.2 U/dL
pH, UA: 5.5 (ref 5.0–8.0)

## 2024-07-18 MED ORDER — SULFAMETHOXAZOLE-TRIMETHOPRIM 800-160 MG PO TABS: 1.0000 | ORAL_TABLET | Freq: Two times a day (BID) | ORAL | 0 refills | Status: AC

## 2024-07-18 NOTE — ED Triage Notes (Signed)
 I started feeling bad a few days ago, cough, nausea, runny nose then yesterday started burning with urination and lower abd pain. No fever (known). Some nausea (no vomiting).

## 2024-07-18 NOTE — ED Provider Notes (Signed)
 [name]    CSN: [id number] Arrival date & time: 07/18/24  9171      History   Chief Complaint Chief Complaint  Patient presents with   Abdominal Pain   UTI Symptoms   Nausea   Cough    HPI Morgan [name] is a 73 y.o. female.   73 year old female who presents urgent care with complaints of dysuria, abdominal pain and feeling off balance.  She has also had a little bit of a cough.  Her symptoms started 1 to 2 days ago.  She reports that symptoms have gotten worse in the last 24 hours.  She reports she has also been feeling a little bit off balance but denies fevers.  She did have a little bit of nausea but no vomiting.  She denies any loss of consciousness, congestion, chest pain, shortness of breath.   Abdominal Pain Associated symptoms: cough and dysuria   Associated symptoms: no chest pain, no chills, no fever, no hematuria, no shortness of breath, no sore throat and no vomiting   Cough Associated symptoms: no chest pain, no chills, no ear pain, no fever, no rash, no shortness of breath and no sore throat     Past Medical History:  Diagnosis Date   Allergy    Anemia    Anxiety    Arthritis    CHF (congestive heart failure) (HCC)    Chronic kidney disease    COPD (chronic obstructive pulmonary disease) (HCC)    Depression    GERD (gastroesophageal reflux disease)    Headache    Hyperlipidemia    Hypertension    MVP (mitral valve prolapse)    Parkinson disease (HCC)    Vascular dementia (HCC)     Patient Active Problem List   Diagnosis Date Noted   Osteoarthritis of right knee 04/24/2024   Seasonal allergic rhinitis due to pollen 04/22/2024   Heart murmur, systolic 02/03/2024   BRBPR (bright red blood per rectum) 12/06/2023   Lower abdominal pain 12/06/2023   Anxiety state 10/22/2023   At high risk for falls 08/30/2023   Multiple falls 08/30/2023   History of angiotensin II receptor antagonist allergy 07/03/2023   Angioedema 06/21/2023    Bilateral tinnitus 05/20/2023   History of syncope 05/20/2023   LOC (loss of consciousness) (HCC) 04/04/2023   Adjustment reaction with anxiety and depression 02/05/2023   Chronic diarrhea 02/05/2023   Carotid stenosis 10/02/2022   Gastroparesis 09/27/2022   Lipoma of colon 09/21/2022   Atherosclerosis of aorta (HCC) 09/21/2022   Diverticulosis 09/21/2022   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   History of ischemic stroke 09/21/2022   History of colon polyps 09/21/2022   Parkinson disease (HCC)    Migraine, unspecified, not intractable, without status migrainosus 04/16/2022   Hemiplegia, unspecified affecting left nondominant side (HCC) 04/16/2022   Hypertensive heart and chronic kidney disease with heart failure and stage 1 through stage 4 chronic kidney disease, or unspecified chronic kidney disease (HCC) 04/16/2022   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   Hemiplegic migraine, not intractable, 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   Vitamin B12 deficiency 02/20/2022   Vitamin D  deficiency 02/20/2022   Vascular dementia, unspecified severity, with anxiety (HCC) 01/10/2022   Tremor 10/27/2021   Stage 3a chronic kidney disease (HCC) 09/11/2021   IDA (iron  deficiency anemia) 09/11/2021   Osteopenia 08/28/2021   Chronic diastolic CHF (congestive heart failure) (HCC) 08/09/2020   Pyloric stenosis in  adult    Peptic ulcer disease 03/22/2020   Chronic abdominal pain 02/19/2019   CKD (chronic kidney disease) stage 3, GFR 30-59 ml/min (HCC) 01/13/2019   Oropharyngeal dysphagia 11/12/2018   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/2018   COPD (chronic obstructive pulmonary disease) (HCC) 01/14/2018   Non-rheumatic mitral regurgitation 11/21/2017   HLD (hyperlipidemia) 10/04/2017   GERD (gastroesophageal reflux disease) 10/04/2017   Left hemiparesis (HCC) 10/04/2017   Essential hypertension    Chronic lower back pain 04/29/2013    Paresthesia 04/29/2013    Past Surgical History:  Procedure Laterality Date   ABDOMINAL HYSTERECTOMY     APPENDECTOMY     BALLOON DILATION N/A 04/21/2020   Procedure: BALLOON DILATION;  Surgeon: Teressa Toribio SQUIBB, MD;  Location: [facility];  Service: Endoscopy;  Laterality: N/A;  pyloric/ GI   BALLOON DILATION N/A 07/04/2020   Procedure: BALLOON DILATION;  Surgeon: Shila Gustav GAILS, MD;  Location: [facility];  Service: Endoscopy;  Laterality: N/A;   BIOPSY  04/21/2020   Procedure: BIOPSY;  Surgeon: Teressa Toribio SQUIBB, MD;  Location: [facility];  Service: Endoscopy;;   BIOPSY  07/04/2020   Procedure: BIOPSY;  Surgeon: Shila Gustav GAILS, MD;  Location: [facility];  Service: Endoscopy;;  EGD and COLON   BREAST SURGERY Right 1988   tumor removal   CHOLECYSTECTOMY N/A 05/18/2020   Procedure: LAPAROSCOPIC CHOLECYSTECTOMY;  Surgeon: Gladis Cough, MD;  Location: [facility];  Service: General;  Laterality: N/A;   COLONOSCOPY WITH PROPOFOL  N/A 07/04/2020   Procedure: COLONOSCOPY WITH PROPOFOL ;  Surgeon: Shila Gustav GAILS, MD;  Location: [facility];  Service: Endoscopy;  Laterality: N/A;   COLONOSCOPY WITH PROPOFOL  N/A 11/03/2021   Procedure: COLONOSCOPY WITH PROPOFOL ;  Surgeon: Maryruth Ole DASEN, MD;  Location: [facility];  Service: Endoscopy;  Laterality: N/A;   COLONOSCOPY WITH PROPOFOL  N/A 08/20/2023   Procedure: COLONOSCOPY WITH PROPOFOL ;  Surgeon: Maryruth Ole DASEN, MD;  Location: [facility];  Service: Endoscopy;  Laterality: N/A;   ESOPHAGEAL DILATION  08/11/2020   Procedure: PYLORIC DILATION;  Surgeon: Legrand Victory LITTIE DOUGLAS, MD;  Location: [facility];  Service: Gastroenterology;;   ESOPHAGOGASTRODUODENOSCOPY (EGD) WITH PROPOFOL  N/A 04/21/2020   Procedure: ESOPHAGOGASTRODUODENOSCOPY (EGD) WITH PROPOFOL ;  Surgeon: Teressa Toribio SQUIBB, MD;  Location: [facility];  Service: Endoscopy;  Laterality: N/A;   ESOPHAGOGASTRODUODENOSCOPY (EGD) WITH PROPOFOL  N/A 07/04/2020   Procedure:  ESOPHAGOGASTRODUODENOSCOPY (EGD) WITH PROPOFOL ;  Surgeon: Shila Gustav GAILS, MD;  Location: [facility];  Service: Endoscopy;  Laterality: N/A;   ESOPHAGOGASTRODUODENOSCOPY (EGD) WITH PROPOFOL  N/A 08/11/2020   Procedure: ESOPHAGOGASTRODUODENOSCOPY (EGD) WITH PROPOFOL ;  Surgeon: Legrand Victory LITTIE DOUGLAS, MD;  Location: [facility];  Service: Gastroenterology;  Laterality: N/A;   ESOPHAGOGASTRODUODENOSCOPY (EGD) WITH PROPOFOL  N/A 11/03/2021   Procedure: ESOPHAGOGASTRODUODENOSCOPY (EGD) WITH PROPOFOL ;  Surgeon: Maryruth Ole DASEN, MD;  Location: [facility];  Service: Endoscopy;  Laterality: N/A;   ESOPHAGOGASTRODUODENOSCOPY (EGD) WITH PROPOFOL  N/A 05/07/2022   Procedure: ESOPHAGOGASTRODUODENOSCOPY (EGD) WITH PROPOFOL ;  Surgeon: Unk Corinn Skiff, MD;  Location: [facility];  Service: Gastroenterology;  Laterality: N/A;   HEMOSTASIS CLIP PLACEMENT  08/20/2023   Procedure: HEMOSTASIS CLIP PLACEMENT;  Surgeon: Maryruth Ole DASEN, MD;  Location: [facility];  Service: Endoscopy;;   KNEE SURGERY  2005   2 times left   KNEE SURGERY Right    KYPHOPLASTY N/A 07/20/2021   Procedure: T 10KYPHOPLASTY;  Surgeon: Kathlynn Sharper, MD;  Location: [facility];  Service: Orthopedics;  Laterality: N/A;   KYPHOPLASTY N/A 08/23/2021   Procedure: T9 KYPHOPLASTY;  Surgeon: Kathlynn,  Ozell, MD;  Location: [facility];  Service: Orthopedics;  Laterality: N/A;   POLYPECTOMY  07/04/2020   Procedure: POLYPECTOMY;  Surgeon: Shila Gustav GAILS, MD;  Location: [facility];  Service: Endoscopy;;   POLYPECTOMY  08/20/2023   Procedure: POLYPECTOMY;  Surgeon: Maryruth Ole DASEN, MD;  Location: [facility];  Service: Endoscopy;;   SUBMUCOSAL TATTOO INJECTION  08/20/2023   Procedure: SUBMUCOSAL TATTOO INJECTION;  Surgeon: Maryruth Ole DASEN, MD;  Location: [facility];  Service: Endoscopy;;   TUBAL LIGATION      OB History   No obstetric history on file.      Home Medications    Prior to Admission medications   Medication  Sig Start Date End Date Taking? Authorizing Provider  amoxicillin -clavulanate (AUGMENTIN ) 875-125 MG tablet Take 1 tablet by mouth 2 (two) times daily. 05/19/24  Yes [provider]  carbidopa -levodopa  (SINEMET  IR) 25-100 MG tablet Take 2 tablets by mouth 2 (two) times daily. 05/29/24 05/29/25 Yes [provider]  lamoTRIgine (LAMICTAL) 25 MG tablet Take 50 mg by mouth 2 (two) times daily. 05/29/24 05/29/25 Yes [provider]  sulfamethoxazole -trimethoprim  (BACTRIM  DS) 800-160 MG tablet Take 1 tablet by mouth 2 (two) times daily for 7 days. 07/18/24 07/25/24 Yes Jamarco Zaldivar A, PA-C  acetaminophen  (TYLENOL ) 325 MG tablet Take 2 tablets (650 mg total) by mouth every 4 (four) hours as needed for mild pain (or temp > 37.5 C (99.5 F)). 01/30/22   Laurita Pillion, MD  albuterol  (VENTOLIN  HFA) 108 (90 Base) MCG/ACT inhaler Inhale 2 puffs into the lungs every 6 (six) hours as needed for wheezing or shortness of breath. 02/20/23   Hunsucker, Donnice SAUNDERS, MD  Budeson-Glycopyrrol-Formoterol  (BREZTRI  AEROSPHERE) 160-9-4.8 MCG/ACT AERO Inhale 2 puffs into the lungs in the morning and at bedtime. 02/20/23   Hunsucker, Donnice SAUNDERS, MD  busPIRone  (BUSPAR ) 5 MG tablet TAKE 1 TABLET BY MOUTH TWICE A DAY 06/01/24   Corwin Antu, FNP  carbidopa -levodopa  (SINEMET  IR) 25-100 MG tablet Take 2 tablets by mouth 3 (three) times daily. 11/21/21   [provider]  cyanocobalamin  (VITAMIN B12) 100 MCG tablet Take 100 mcg by mouth daily.    [provider]  diclofenac  Sodium (VOLTAREN  ARTHRITIS PAIN) 1 % GEL Apply 4 g topically 4 (four) times daily. 02/03/24   Narendra, Nischal, MD  EPINEPHrine  0.3 mg/0.3 mL IJ SOAJ injection Inject 0.3 mg into the muscle as needed for anaphylaxis. 05/28/24   Willo Dunnings, MD  fluticasone  (FLONASE ) 50 MCG/ACT nasal spray SPRAY 2 SPRAYS INTO EACH NOSTRIL EVERY DAY 04/16/24   Dugal, Tabitha, FNP  folic acid  (FOLVITE ) 1 MG tablet TAKE 1 TABLET BY MOUTH EVERY DAY 03/31/24    Corwin Antu, FNP  hydrocortisone  (ANUSOL -HC) 25 MG suppository Place 1 suppository (25 mg total) rectally 2 (two) times daily. 12/06/23   Rilla Baller, MD  hydrOXYzine  (VISTARIL ) 50 MG capsule TAKE 1 CAPSULE BY MOUTH 3 TIMES DAILY AS NEEDED. 06/09/24   Corwin Antu, FNP  ibuprofen  (ADVIL ) 400 MG tablet Take 1 tablet (400 mg total) by mouth every 8 (eight) hours as needed for headache or moderate pain. 08/09/22   Patel, Sona, MD  KLOR-CON  M20 20 MEQ tablet TAKE 1 TABLET BY MOUTH EVERY DAY 03/16/24   Dugal, Tabitha, FNP  lamoTRIgine (LAMICTAL) 25 MG tablet Take 25 mg by mouth. 02/21/24 02/20/25  [provider]  LORazepam  (ATIVAN ) 0.5 MG tablet Take 1 tablet (0.5 mg total) by mouth at bedtime as needed for anxiety. 02/13/24   Dugal, Tabitha, FNP  meloxicam  (MOBIC ) 15 MG tablet TAKE 1 TABLET EVERY DAY BY ORAL ROUTE WITH MEAL(S). 05/18/24   [provider]  NUCALA 100 MG/ML SOAJ  02/21/24   [provider]  OXYGEN  Inhale 3 L/min into the lungs See admin instructions. 3 L/min at bedtime and as needed for shortness of breath throughout the day    [provider]  pantoprazole  (PROTONIX ) 20 MG tablet Take by mouth. 01/02/23 07/01/24  [provider]  promethazine  (PHENERGAN ) 25 MG tablet Take 1 tablet (25 mg total) by mouth every 6 (six) hours as needed. 05/19/24   Dugal, Tabitha, FNP  sertraline  (ZOLOFT ) 100 MG tablet Take two tablets once daily for total 200 mg daily 10/22/23   Corwin Antu, FNP  tiZANidine  (ZANAFLEX ) 4 MG tablet Take 1 tablet (4 mg total) by mouth every 8 (eight) hours as needed (muscular pain). 03/09/24   Arloa Suzen RAMAN, NP  topiramate  (TOPAMAX ) 50 MG tablet TAKE 1 TABLET BY MOUTH EVERYDAY AT BEDTIME 10/23/23   Corwin Antu, FNP  traMADol  (ULTRAM ) 50 MG tablet Take 1 tablet (50 mg total) by mouth every 6 (six) hours as needed. 02/29/24   Kevaughn Ewing A, PA-C  Vitamin D , Ergocalciferol , (DRISDOL) 1.25 MG (50000 UNIT) CAPS capsule Take 50,000  Units by mouth once a week. 02/12/24   [provider]    Family History Family History  Problem Relation Age of Onset   Breast cancer Mother 51   Ovarian cancer Mother 75   Brain cancer Mother    Hypertension Father    Hyperlipidemia Father    Heart disease Father    Colon cancer Father        diagnosed in his 24s   Breast cancer Maternal Grandmother 23   Diabetes Paternal Grandmother    Breast cancer Paternal Grandmother 77   Other Son        drug over dose   Colon cancer Maternal Grandfather        dx in his 42s   Liver disease Maternal Grandfather    Breast cancer Maternal Aunt 60   Breast cancer Maternal Aunt 60   Colon cancer Maternal Uncle    Stomach cancer Neg Hx    Pancreatic cancer Neg Hx    Esophageal cancer Neg Hx     Social History Social History   Tobacco Use   Smoking status: Former    Current packs/day: 0.00    Average packs/day: 1 pack/day for 15.0 years (15.0 ttl pk-yrs)    Types: Cigarettes    Start date: 09/12/2002    Quit date: 09/12/2017    Years since quitting: 6.8   Smokeless tobacco: Never  Vaping Use   Vaping status: Never Used  Substance Use Topics   Alcohol  use: Never   Drug use: No     Allergies   Angiotensin receptor blockers, Meperidine, Strawberry extract, Sucralfate , and Wasp venom   Review of Systems Review of Systems  Constitutional:  Negative for chills and fever.  HENT:  Negative for ear pain and sore throat.   Eyes:  Negative for pain and visual disturbance.  Respiratory:  Positive for cough. Negative for shortness of breath.   Cardiovascular:  Negative for chest pain and palpitations.  Gastrointestinal:  Positive for abdominal pain. Negative for vomiting.  Genitourinary:  Positive for dysuria and frequency. Negative for hematuria.  Musculoskeletal:  Negative for arthralgias and back pain.  Skin:  Negative for color change and rash.  Neurological:  Negative for seizures and syncope.  Feeling slightly  off balance  All other systems reviewed and are negative.    Physical Exam Triage Vital Signs ED Triage Vitals  Encounter Vitals Group     BP 07/18/24 1012 (!) 148/92     Girls Systolic BP Percentile --      Girls Diastolic BP Percentile --      Boys Systolic BP Percentile --      Boys Diastolic BP Percentile --      Pulse Rate 07/18/24 1012 77     Resp 07/18/24 1012 18     Temp 07/18/24 1012 98.8 F (37.1 C)     Temp Source 07/18/24 1012 Oral     SpO2 07/18/24 1012 98 %     Weight 07/18/24 1009 195 lb (88.5 kg)     Height 07/18/24 1009 6' 1 (1.854 m)     Head Circumference --      Peak Flow --      Pain Score 07/18/24 1007 5     Pain Loc --      Pain Education --      Exclude from Growth Chart --    No data found.  Updated Vital Signs BP (!) 148/92 (BP Location: Left Arm)   Pulse 77   Temp 98.8 F (37.1 C) (Oral)   Resp 18   Ht 6' 1 (1.854 m)   Wt 195 lb (88.5 kg)   SpO2 98%   BMI 25.73 kg/m   Visual Acuity Right Eye Distance:   Left Eye Distance:   Bilateral Distance:    Right Eye Near:   Left Eye Near:    Bilateral Near:     Physical Exam Vitals and nursing note reviewed.  Constitutional:      General: She is not in acute distress.    Appearance: She is well-developed.  HENT:     Head: Normocephalic and atraumatic.  Eyes:     Conjunctiva/sclera: Conjunctivae normal.  Cardiovascular:     Rate and Rhythm: Normal rate and regular rhythm.     Heart sounds: No murmur heard. Pulmonary:     Effort: Pulmonary effort is normal. No respiratory distress.     Breath sounds: Normal breath sounds.  Abdominal:     Palpations: Abdomen is soft.     Tenderness: There is generalized abdominal tenderness. There is no right CVA tenderness, left CVA tenderness, guarding or rebound.     Hernia: No hernia (Mild rectus diastases) is present.  Musculoskeletal:        General: No swelling.     Cervical back: Neck supple.  Skin:    General: Skin is warm and dry.      Capillary Refill: Capillary refill takes less than 2 seconds.  Neurological:     Mental Status: She is alert.  Psychiatric:        Mood and Affect: Mood normal.      UC Treatments / Results  Labs (all labs ordered are listed, but only abnormal results are displayed) Labs Reviewed  POCT URINE DIPSTICK - Abnormal; Notable for the following components:      Result Value   Clarity, UA cloudy (*)    Blood, UA trace-intact (*)    Nitrite, UA Positive (*)    Leukocytes, UA Small (1+) (*)    All other components within normal limits    EKG   Radiology No results found.  Procedures Procedures (including critical care time)  Medications Ordered in UC Medications - No data to display  Initial Impression / Assessment and Plan / UC Course  I have reviewed the triage vital signs and the nursing notes.  Pertinent labs & imaging results that were available during my care of the patient were reviewed by me and considered in my medical decision making (see chart for details).     Acute cystitis without hematuria   Urinalysis done today is consistent with a urinary tract infection.  Due to the symptoms induration we will treat with antibiotics by mouth.  We will treat this with the following: Bactrim  DS (sulfamethoxazole /trimethoprim ) 800/160 mg 1 tablet twice daily for 7 days. This is an antibiotic. Take this with food. Make sure to drink plenty of water to stay hydrated If the abdominal pain worsens or you develop fevers or worsening balance issues while on antibiotics then recommend either returning to urgent care or going to the emergency room for further evaluation May return to urgent care as needed otherwise  Final Clinical Impressions(s) / UC Diagnoses   Final diagnoses:  Acute cystitis without hematuria     Discharge Instructions      Urinalysis done today is consistent with a urinary tract infection.  Due to the symptoms induration we will treat with antibiotics by  mouth.  We will treat this with the following: Bactrim  DS (sulfamethoxazole /trimethoprim ) 800/160 mg 1 tablet twice daily for 7 days. This is an antibiotic. Take this with food. Make sure to drink plenty of water to stay hydrated If the abdominal pain worsens or you develop fevers or worsening balance issues while on antibiotics then recommend either returning to urgent care or going to the emergency room for further evaluation May return to urgent care as needed otherwise    ED Prescriptions     Medication Sig Dispense Auth. Provider   sulfamethoxazole -trimethoprim  (BACTRIM  DS) 800-160 MG tablet Take 1 tablet by mouth 2 (two) times daily for 7 days. 14 tablet Teresa Almarie LABOR, PA-C      PDMP not reviewed this encounter.   Teresa Almarie LABOR, [place] 07/18/24 1046

## 2024-07-18 NOTE — Discharge Instructions (Addendum)
 Urinalysis done today is consistent with a urinary tract infection.  Due to the symptoms induration we will treat with antibiotics by mouth.  We will treat this with the following: Bactrim  DS (sulfamethoxazole /trimethoprim ) 800/160 mg 1 tablet twice daily for 7 days. This is an antibiotic. Take this with food. Make sure to drink plenty of water to stay hydrated If the abdominal pain worsens or you develop fevers or worsening balance issues while on antibiotics then recommend either returning to urgent care or going to the emergency room for further evaluation May return to urgent care as needed otherwise

## 2024-07-20 ENCOUNTER — Ambulatory Visit (HOSPITAL_COMMUNITY): Payer: Self-pay

## 2024-07-20 LAB — URINE CULTURE: Culture: >=100000 — AB

## 2024-07-21 DIAGNOSIS — S90112A Contusion of left great toe without damage to nail, initial encounter: Secondary | ICD-10-CM | POA: Diagnosis not present

## 2024-07-22 ENCOUNTER — Ambulatory Visit (INDEPENDENT_AMBULATORY_CARE_PROVIDER_SITE_OTHER): Admitting: Otolaryngology

## 2024-07-22 ENCOUNTER — Encounter (INDEPENDENT_AMBULATORY_CARE_PROVIDER_SITE_OTHER): Payer: Self-pay | Admitting: Otolaryngology

## 2024-07-22 VITALS — BP 141/80 | HR 84 | Ht 73.0 in | Wt 189.0 lb

## 2024-07-22 DIAGNOSIS — R0981 Nasal congestion: Secondary | ICD-10-CM

## 2024-07-22 DIAGNOSIS — J310 Chronic rhinitis: Secondary | ICD-10-CM

## 2024-07-22 DIAGNOSIS — H6121 Impacted cerumen, right ear: Secondary | ICD-10-CM | POA: Diagnosis not present

## 2024-07-22 DIAGNOSIS — H9041 Sensorineural hearing loss, unilateral, right ear, with unrestricted hearing on the contralateral side: Secondary | ICD-10-CM

## 2024-07-22 DIAGNOSIS — H9201 Otalgia, right ear: Secondary | ICD-10-CM | POA: Diagnosis not present

## 2024-07-22 DIAGNOSIS — J343 Hypertrophy of nasal turbinates: Secondary | ICD-10-CM | POA: Diagnosis not present

## 2024-07-22 DIAGNOSIS — H6981 Other specified disorders of Eustachian tube, right ear: Secondary | ICD-10-CM

## 2024-07-22 MED ORDER — FLUTICASONE PROPIONATE 50 MCG/ACT NA SUSP: 2.0000 | Freq: Every day | NASAL | 10 refills | Status: DC

## 2024-07-23 ENCOUNTER — Telehealth (INDEPENDENT_AMBULATORY_CARE_PROVIDER_SITE_OTHER): Payer: Self-pay | Admitting: Otolaryngology

## 2024-07-23 DIAGNOSIS — H9041 Sensorineural hearing loss, unilateral, right ear, with unrestricted hearing on the contralateral side: Secondary | ICD-10-CM | POA: Insufficient documentation

## 2024-07-23 DIAGNOSIS — J310 Chronic rhinitis: Secondary | ICD-10-CM | POA: Insufficient documentation

## 2024-07-23 DIAGNOSIS — H6981 Other specified disorders of Eustachian tube, right ear: Secondary | ICD-10-CM | POA: Insufficient documentation

## 2024-07-23 DIAGNOSIS — H6121 Impacted cerumen, right ear: Secondary | ICD-10-CM | POA: Insufficient documentation

## 2024-07-23 DIAGNOSIS — J343 Hypertrophy of nasal turbinates: Secondary | ICD-10-CM | POA: Insufficient documentation

## 2024-07-23 NOTE — Telephone Encounter (Signed)
 LVM for patient to return call.  Need to reschedule October appointment.  She needs an Audio on same day as follow-up with Dr. Karis.

## 2024-07-23 NOTE — Progress Notes (Signed)
 CC: Right ear pain, right ear muffled hearing, chronic nasal congestion  HPI:  Morgan [name] is a 73 y.o. female who presents today complaining of recurrent right ear pain and muffled hearing for the past year.  She also has chronic nasal congestion, especially during the allergy seasons.  She has been having her right ear symptoms for 1 year.  She was treated with over-the-counter eardrops and peroxide without improvement in her symptoms.  She has no previous ENT surgery.  She also denies any recent known otitis media or otitis externa.  Past Medical History:  Diagnosis Date   Allergy    Anemia    Anxiety    Arthritis    CHF (congestive heart failure) (HCC)    Chronic kidney disease    COPD (chronic obstructive pulmonary disease) (HCC)    Depression    GERD (gastroesophageal reflux disease)    Headache    Hyperlipidemia    Hypertension    MVP (mitral valve prolapse)    Parkinson disease (HCC)    Vascular dementia (HCC)     Past Surgical History:  Procedure Laterality Date   ABDOMINAL HYSTERECTOMY     APPENDECTOMY     BALLOON DILATION N/A 04/21/2020   Procedure: BALLOON DILATION;  Surgeon: Teressa Toribio SQUIBB, MD;  Location: [facility];  Service: Endoscopy;  Laterality: N/A;  pyloric/ GI   BALLOON DILATION N/A 07/04/2020   Procedure: BALLOON DILATION;  Surgeon: Shila Gustav GAILS, MD;  Location: [facility];  Service: Endoscopy;  Laterality: N/A;   BIOPSY  04/21/2020   Procedure: BIOPSY;  Surgeon: Teressa Toribio SQUIBB, MD;  Location: [facility];  Service: Endoscopy;;   BIOPSY  07/04/2020   Procedure: BIOPSY;  Surgeon: Shila Gustav GAILS, MD;  Location: [facility];  Service: Endoscopy;;  EGD and COLON   BREAST SURGERY Right 1988   tumor removal   CHOLECYSTECTOMY N/A 05/18/2020   Procedure: LAPAROSCOPIC CHOLECYSTECTOMY;  Surgeon: Gladis Cough, MD;  Location: [facility];  Service: General;  Laterality: N/A;   COLONOSCOPY WITH PROPOFOL  N/A 07/04/2020   Procedure: COLONOSCOPY WITH  PROPOFOL ;  Surgeon: Shila Gustav GAILS, MD;  Location: [facility];  Service: Endoscopy;  Laterality: N/A;   COLONOSCOPY WITH PROPOFOL  N/A 11/03/2021   Procedure: COLONOSCOPY WITH PROPOFOL ;  Surgeon: Maryruth Ole DASEN, MD;  Location: [facility];  Service: Endoscopy;  Laterality: N/A;   COLONOSCOPY WITH PROPOFOL  N/A 08/20/2023   Procedure: COLONOSCOPY WITH PROPOFOL ;  Surgeon: Maryruth Ole DASEN, MD;  Location: [facility];  Service: Endoscopy;  Laterality: N/A;   ESOPHAGEAL DILATION  08/11/2020   Procedure: PYLORIC DILATION;  Surgeon: Legrand Victory LITTIE DOUGLAS, MD;  Location: [facility];  Service: Gastroenterology;;   ESOPHAGOGASTRODUODENOSCOPY (EGD) WITH PROPOFOL  N/A 04/21/2020   Procedure: ESOPHAGOGASTRODUODENOSCOPY (EGD) WITH PROPOFOL ;  Surgeon: Teressa Toribio SQUIBB, MD;  Location: [facility];  Service: Endoscopy;  Laterality: N/A;   ESOPHAGOGASTRODUODENOSCOPY (EGD) WITH PROPOFOL  N/A 07/04/2020   Procedure: ESOPHAGOGASTRODUODENOSCOPY (EGD) WITH PROPOFOL ;  Surgeon: Shila Gustav GAILS, MD;  Location: [facility];  Service: Endoscopy;  Laterality: N/A;   ESOPHAGOGASTRODUODENOSCOPY (EGD) WITH PROPOFOL  N/A 08/11/2020   Procedure: ESOPHAGOGASTRODUODENOSCOPY (EGD) WITH PROPOFOL ;  Surgeon: Legrand Victory LITTIE DOUGLAS, MD;  Location: [facility];  Service: Gastroenterology;  Laterality: N/A;   ESOPHAGOGASTRODUODENOSCOPY (EGD) WITH PROPOFOL  N/A 11/03/2021   Procedure: ESOPHAGOGASTRODUODENOSCOPY (EGD) WITH PROPOFOL ;  Surgeon: Maryruth Ole DASEN, MD;  Location: [facility];  Service: Endoscopy;  Laterality: N/A;   ESOPHAGOGASTRODUODENOSCOPY (EGD) WITH PROPOFOL  N/A 05/07/2022   Procedure: ESOPHAGOGASTRODUODENOSCOPY (EGD) WITH PROPOFOL ;  Surgeon: Unk,  Corinn Skiff, MD;  Location: [facility];  Service: Gastroenterology;  Laterality: N/A;   HEMOSTASIS CLIP PLACEMENT  08/20/2023   Procedure: HEMOSTASIS CLIP PLACEMENT;  Surgeon: Maryruth Ole DASEN, MD;  Location: [facility];  Service: Endoscopy;;   KNEE  SURGERY  2005   2 times left   KNEE SURGERY Right    KYPHOPLASTY N/A 07/20/2021   Procedure: T 10KYPHOPLASTY;  Surgeon: Kathlynn Sharper, MD;  Location: [facility];  Service: Orthopedics;  Laterality: N/A;   KYPHOPLASTY N/A 08/23/2021   Procedure: T9 KYPHOPLASTY;  Surgeon: Kathlynn Sharper, MD;  Location: [facility];  Service: Orthopedics;  Laterality: N/A;   POLYPECTOMY  07/04/2020   Procedure: POLYPECTOMY;  Surgeon: Shila Gustav GAILS, MD;  Location: [facility];  Service: Endoscopy;;   POLYPECTOMY  08/20/2023   Procedure: POLYPECTOMY;  Surgeon: Maryruth Ole DASEN, MD;  Location: [facility];  Service: Endoscopy;;   SUBMUCOSAL TATTOO INJECTION  08/20/2023   Procedure: SUBMUCOSAL TATTOO INJECTION;  Surgeon: Maryruth Ole DASEN, MD;  Location: [facility];  Service: Endoscopy;;   TUBAL LIGATION      Family History  Problem Relation Age of Onset   Breast cancer Mother 29   Ovarian cancer Mother 81   Brain cancer Mother    Hypertension Father    Hyperlipidemia Father    Heart disease Father    Colon cancer Father        diagnosed in his 80s   Breast cancer Maternal Grandmother 62   Diabetes Paternal Grandmother    Breast cancer Paternal Grandmother 51   Other Son        drug over dose   Colon cancer Maternal Grandfather        dx in his 18s   Liver disease Maternal Grandfather    Breast cancer Maternal Aunt 60   Breast cancer Maternal Aunt 60   Colon cancer Maternal Uncle    Stomach cancer Neg Hx    Pancreatic cancer Neg Hx    Esophageal cancer Neg Hx     Social History:  reports that she quit smoking about 6 years ago. Her smoking use included cigarettes. She started smoking about 21 years ago. She has a 15 pack-year smoking history. She has never used smokeless tobacco. She reports that she does not drink alcohol  and does not use drugs.  Allergies:  Allergies  Allergen Reactions   Angiotensin Receptor Blockers Swelling    Angioedema   Meperidine Hives and Anaphylaxis    Strawberry Extract Hives and Swelling   Sucralfate  Nausea Only and Other (See Comments)    Severe nausea Other reaction(s): Other (See Comments) Severe nausea Severe nausea   Wasp Venom Hives    Prior to Admission medications   Medication Sig Start Date End Date Taking? Authorizing Provider  acetaminophen  (TYLENOL ) 325 MG tablet Take 2 tablets (650 mg total) by mouth every 4 (four) hours as needed for mild pain (or temp > 37.5 C (99.5 F)). 01/30/22  Yes Laurita Pillion, MD  albuterol  (VENTOLIN  HFA) 108 (90 Base) MCG/ACT inhaler Inhale 2 puffs into the lungs every 6 (six) hours as needed for wheezing or shortness of breath. 02/20/23  Yes Hunsucker, Donnice SAUNDERS, MD  Budeson-Glycopyrrol-Formoterol  (BREZTRI  AEROSPHERE) 160-9-4.8 MCG/ACT AERO Inhale 2 puffs into the lungs in the morning and at bedtime. 02/20/23  Yes Hunsucker, Donnice SAUNDERS, MD  busPIRone  (BUSPAR ) 5 MG tablet TAKE 1 TABLET BY MOUTH TWICE A DAY 06/01/24  Yes Dugal, Tabitha, FNP  carbidopa -levodopa  (SINEMET  IR) 25-100 MG tablet Take 2 tablets  by mouth 3 (three) times daily. 11/21/21  Yes [provider]  carbidopa -levodopa  (SINEMET  IR) 25-100 MG tablet Take 2 tablets by mouth 2 (two) times daily. 05/29/24 05/29/25 Yes [provider]  cyanocobalamin  (VITAMIN B12) 100 MCG tablet Take 100 mcg by mouth daily.   Yes [provider]  diclofenac  Sodium (VOLTAREN  ARTHRITIS PAIN) 1 % GEL Apply 4 g topically 4 (four) times daily. 02/03/24  Yes Onesimo Claude, MD  EPINEPHrine  0.3 mg/0.3 mL IJ SOAJ injection Inject 0.3 mg into the muscle as needed for anaphylaxis. 05/28/24  Yes Willo Dunnings, MD  fluticasone  (FLONASE ) 50 MCG/ACT nasal spray SPRAY 2 SPRAYS INTO EACH NOSTRIL EVERY DAY 04/16/24  Yes Dugal, Tabitha, FNP  fluticasone  (FLONASE ) 50 MCG/ACT nasal spray Place 2 sprays into both nostrils daily. 07/22/24 08/21/24 Yes Karis Clunes, MD  folic acid  (FOLVITE ) 1 MG tablet TAKE 1 TABLET BY MOUTH EVERY DAY 03/31/24  Yes Dugal, Tabitha, FNP   hydrocortisone  (ANUSOL -HC) 25 MG suppository Place 1 suppository (25 mg total) rectally 2 (two) times daily. 12/06/23  Yes Rilla Baller, MD  hydrOXYzine  (VISTARIL ) 50 MG capsule TAKE 1 CAPSULE BY MOUTH 3 TIMES DAILY AS NEEDED. 06/09/24  Yes Dugal, Tabitha, FNP  ibuprofen  (ADVIL ) 400 MG tablet Take 1 tablet (400 mg total) by mouth every 8 (eight) hours as needed for headache or moderate pain. 08/09/22  Yes Patel, Sona, MD  KLOR-CON  M20 20 MEQ tablet TAKE 1 TABLET BY MOUTH EVERY DAY 03/16/24  Yes Dugal, Tabitha, FNP  lamoTRIgine (LAMICTAL) 25 MG tablet Take 25 mg by mouth. 02/21/24 02/20/25 Yes [provider]  lamoTRIgine (LAMICTAL) 25 MG tablet Take 50 mg by mouth 2 (two) times daily. 05/29/24 05/29/25 Yes [provider]  LORazepam  (ATIVAN ) 0.5 MG tablet Take 1 tablet (0.5 mg total) by mouth at bedtime as needed for anxiety. 02/13/24  Yes Dugal, Ginger, FNP  meloxicam  (MOBIC ) 15 MG tablet TAKE 1 TABLET EVERY DAY BY ORAL ROUTE WITH MEAL(S). 05/18/24  Yes [provider]  NUCALA 100 MG/ML SOAJ  02/21/24  Yes [provider]  OXYGEN  Inhale 3 L/min into the lungs See admin instructions. 3 L/min at bedtime and as needed for shortness of breath throughout the day   Yes [provider]  pantoprazole  (PROTONIX ) 20 MG tablet Take by mouth. 01/02/23 07/22/24 Yes [provider]  promethazine  (PHENERGAN ) 25 MG tablet Take 1 tablet (25 mg total) by mouth every 6 (six) hours as needed. 05/19/24  Yes Dugal, Tabitha, FNP  sertraline  (ZOLOFT ) 100 MG tablet Take two tablets once daily for total 200 mg daily 10/22/23  Yes Dugal, Tabitha, FNP  sulfamethoxazole -trimethoprim  (BACTRIM  DS) 800-160 MG tablet Take 1 tablet by mouth 2 (two) times daily for 7 days. 07/18/24 07/25/24 Yes White, Elizabeth A, PA-C  topiramate  (TOPAMAX ) 50 MG tablet TAKE 1 TABLET BY MOUTH EVERYDAY AT BEDTIME 10/23/23  Yes Dugal, Tabitha, FNP  traMADol  (ULTRAM ) 50 MG tablet Take 1 tablet (50 mg total) by mouth  every 6 (six) hours as needed. 02/29/24  Yes White, Elizabeth A, PA-C  Vitamin D , Ergocalciferol , (DRISDOL) 1.25 MG (50000 UNIT) CAPS capsule Take 50,000 Units by mouth once a week. 02/12/24  Yes [provider]  amoxicillin -clavulanate (AUGMENTIN ) 875-125 MG tablet Take 1 tablet by mouth 2 (two) times daily. Patient not taking: Reported on 07/22/2024 05/19/24   [provider]  tiZANidine  (ZANAFLEX ) 4 MG tablet Take 1 tablet (4 mg total) by mouth every 8 (eight) hours as needed (muscular pain). Patient not taking:  Reported on 07/22/2024 03/09/24   Arloa Suzen RAMAN, NP    Blood pressure (!) 141/80, pulse 84, height 6' 1 (1.854 m), weight 189 lb (85.7 kg), SpO2 99%. Exam: General: Communicates without difficulty, well nourished, no acute distress. Head: Normocephalic, no evidence injury, no tenderness, facial buttresses intact without stepoff. Face/sinus: No tenderness to palpation and percussion. Facial movement is normal and symmetric. Eyes: PERRL, EOMI. No scleral icterus, conjunctivae clear. Neuro: CN II exam reveals vision grossly intact.  No nystagmus at any point of gaze. Ears: Auricles well formed without lesions.  Right ear cerumen impaction.  The left ear canal and tympanic membrane are normal.  Nose: External evaluation reveals normal support and skin without lesions.  Dorsum is intact.  Anterior rhinoscopy reveals congested mucosa over anterior aspect of inferior turbinates and intact septum.  No purulence noted. Oral:  Oral cavity and oropharynx are intact, symmetric, without erythema or edema.  Mucosa is moist without lesions. Neck: Full range of motion without pain.  There is no significant lymphadenopathy.  No masses palpable.  Thyroid  bed within normal limits to palpation.  Parotid glands and submandibular glands equal bilaterally without mass.  Trachea is midline. Neuro:  CN 2-12 grossly intact.   Procedure:  Flexible Nasal Endoscopy: Description: Risks, benefits, and  alternatives of flexible endoscopy were explained to the patient.  Specific mention was made of the risk of throat numbness with difficulty swallowing, possible bleeding from the nose and mouth, and pain from the procedure.  The patient gave oral consent to proceed.  The flexible scope was inserted into the right nasal cavity.  Endoscopy of the interior nasal cavity, superior, inferior, and middle meatus was performed. The sphenoid-ethmoid recess was examined. Edematous mucosa was noted.  No polyp, mass, or lesion was appreciated. Olfactory cleft was clear.  Nasopharynx was clear.  Turbinates were hypertrophied but without mass.  The procedure was repeated on the contralateral side with similar findings.  The patient tolerated the procedure well.   Procedure: Right ear cerumen disimpaction Anesthesia: None Description: Under the operating microscope, the cerumen is carefully removed with a combination of cerumen currette, alligator forceps, and suction catheters.  After the cerumen is removed, the TMs are noted to be normal.  No mass, erythema, or lesions. The patient tolerated the procedure well.    Assessment: 1.  The patient's history is suggestive of right ear eustachian tube dysfunction and referred right otalgia. 2.  Right ear cerumen impaction.  After the disimpaction procedure, both tympanic membranes and middle ear spaces are noted to be normal. 3.  Chronic rhinitis with nasal mucosal congestion and bilateral inferior turbinate hypertrophy.  Plan: 1.  The physical exam and nasal endoscopy findings are reviewed with the patient. 2.  Otomicroscopy with right ear cerumen disimpaction. 3.  Flonase  nasal spray 2 sprays each nostril daily.  The importance of consistent daily use is discussed. 4.  Valsalva exercise multiple times a day. 5.  Outpatient hearing test.  No audiologist available today. 6.  The patient will return for reevaluation in 2 months.  Morgan [name] 07/23/2024, 12:18 PM

## 2024-07-25 DIAGNOSIS — J449 Chronic obstructive pulmonary disease, unspecified: Secondary | ICD-10-CM | POA: Diagnosis not present

## 2024-07-25 DIAGNOSIS — I509 Heart failure, unspecified: Secondary | ICD-10-CM | POA: Diagnosis not present

## 2024-07-27 DIAGNOSIS — I509 Heart failure, unspecified: Secondary | ICD-10-CM | POA: Diagnosis not present

## 2024-07-27 DIAGNOSIS — J449 Chronic obstructive pulmonary disease, unspecified: Secondary | ICD-10-CM | POA: Diagnosis not present

## 2024-07-29 ENCOUNTER — Ambulatory Visit (INDEPENDENT_AMBULATORY_CARE_PROVIDER_SITE_OTHER)

## 2024-07-29 ENCOUNTER — Ambulatory Visit
Admission: EM | Admit: 2024-07-29 | Discharge: 2024-07-29 | Disposition: A | Discharge status: Home / Self Care | Attending: Family Medicine | Admitting: Family Medicine

## 2024-07-29 ENCOUNTER — Encounter: Payer: Self-pay | Admitting: Emergency Medicine

## 2024-07-29 DIAGNOSIS — M62838 Other muscle spasm: Secondary | ICD-10-CM | POA: Diagnosis not present

## 2024-07-29 DIAGNOSIS — M25512 Pain in left shoulder: Secondary | ICD-10-CM | POA: Diagnosis not present

## 2024-07-29 MED ORDER — TIZANIDINE HCL 4 MG PO TABS: 4.0000 mg | ORAL_TABLET | Freq: Three times a day (TID) | ORAL | 0 refills | Status: DC | PRN

## 2024-07-29 NOTE — ED Provider Notes (Signed)
 [facility]   [id number] 07/29/24 Arrival Time: 9178  ASSESSMENT & PLAN:  1. Acute pain of left shoulder   2. Muscle spasms of neck    I have personally viewed and independently interpreted the imaging studies ordered this visit. L Shoulder: no acute bony changes; no dislocation.  Has Mobic  at home and takes daily. Trial of: Meds ordered this encounter  Medications   tiZANidine  (ZANAFLEX ) 4 MG tablet    Sig: Take 1 tablet (4 mg total) by mouth every 8 (eight) hours as needed for muscle spasms.    Dispense:  30 tablet    Refill:  0   Heating pad. Do not immobilize shoulder.  Orders Placed This Encounter  Procedures   DG Shoulder Left    Recommend:  Follow-up Information     Corwin Antu, FNP.   Specialty: Family Medicine Why: If worsening or failing to improve as anticipated. Contact information: [address] [place] [address] [phone]                 Reviewed expectations re: course of current medical issues. Questions answered. Outlined signs and symptoms indicating need for more acute intervention. Patient verbalized understanding. After Visit Summary given.  SUBJECTIVE: History from: patient. Morgan [name] is a 73 y.o. female who reports L shoulder pain/upper back pain; x 3 days; reports falling on steps and hitting shoulder area on hard surface. Pain has gradually worsened but is now staple. Certain movements of shoulder exacerbate pain. Denies extremity sensation changes or weakness.   Past Surgical History:  Procedure Laterality Date   ABDOMINAL HYSTERECTOMY     APPENDECTOMY     BALLOON DILATION N/A 04/21/2020   Procedure: BALLOON DILATION;  Surgeon: Teressa Toribio SQUIBB, MD;  Location: [facility];  Service: Endoscopy;  Laterality: N/A;  pyloric/ GI   BALLOON DILATION N/A 07/04/2020   Procedure: BALLOON DILATION;  Surgeon: Shila Gustav GAILS, MD;  Location: [facility];  Service: Endoscopy;  Laterality: N/A;   BIOPSY   04/21/2020   Procedure: BIOPSY;  Surgeon: Teressa Toribio SQUIBB, MD;  Location: [facility];  Service: Endoscopy;;   BIOPSY  07/04/2020   Procedure: BIOPSY;  Surgeon: Shila Gustav GAILS, MD;  Location: [facility];  Service: Endoscopy;;  EGD and COLON   BREAST SURGERY Right 1988   tumor removal   CHOLECYSTECTOMY N/A 05/18/2020   Procedure: LAPAROSCOPIC CHOLECYSTECTOMY;  Surgeon: Gladis Cough, MD;  Location: [facility];  Service: General;  Laterality: N/A;   COLONOSCOPY WITH PROPOFOL  N/A 07/04/2020   Procedure: COLONOSCOPY WITH PROPOFOL ;  Surgeon: Shila Gustav GAILS, MD;  [facility];  Service: Endoscopy;  Laterality: N/A;   COLONOSCOPY WITH PROPOFOL  N/A 11/03/2021   Procedure: COLONOSCOPY WITH PROPOFOL ;  Surgeon: Maryruth Ole DASEN, MD;  Location: [facility];  Service: Endoscopy;  Laterality: N/A;   COLONOSCOPY WITH PROPOFOL  N/A 08/20/2023   Procedure: COLONOSCOPY WITH PROPOFOL ;  Surgeon: Maryruth Ole DASEN, MD;  Location: [facility];  Service: Endoscopy;  Laterality: N/A;   ESOPHAGEAL DILATION  08/11/2020   Procedure: PYLORIC DILATION;  Surgeon: Legrand Victory LITTIE DOUGLAS, MD;  Location: [facility];  Service: Gastroenterology;;   ESOPHAGOGASTRODUODENOSCOPY (EGD) WITH PROPOFOL  N/A 04/21/2020   Procedure: ESOPHAGOGASTRODUODENOSCOPY (EGD) WITH PROPOFOL ;  Surgeon: Teressa Toribio SQUIBB, MD;  [facility]: [facility];  Service: Endoscopy;  Laterality: N/A;   ESOPHAGOGASTRODUODENOSCOPY (EGD) WITH PROPOFOL  N/A 07/04/2020   Procedure: ESOPHAGOGASTRODUODENOSCOPY (EGD) WITH PROPOFOL ;  Surgeon: Shila Gustav GAILS, MD;  [facility];  Service: Endoscopy;  Laterality:  N/A;   ESOPHAGOGASTRODUODENOSCOPY (EGD) WITH PROPOFOL  N/A 08/11/2020   Procedure: ESOPHAGOGASTRODUODENOSCOPY (EGD) WITH PROPOFOL ;  Surgeon: Legrand Victory LITTIE DOUGLAS, MD;  [facility];  Service: Gastroenterology;  Laterality: N/A;   ESOPHAGOGASTRODUODENOSCOPY (EGD) WITH PROPOFOL  N/A 11/03/2021   Procedure: ESOPHAGOGASTRODUODENOSCOPY  (EGD) WITH PROPOFOL ;  Surgeon: Maryruth Ole DASEN, MD;  Location: [facility];  Service: Endoscopy;  Laterality: N/A;   ESOPHAGOGASTRODUODENOSCOPY (EGD) WITH PROPOFOL  N/A 05/07/2022   Procedure: ESOPHAGOGASTRODUODENOSCOPY (EGD) WITH PROPOFOL ;  Surgeon: Unk Corinn Skiff, MD;  Location: [facility];  Service: Gastroenterology;  Laterality: N/A;   HEMOSTASIS CLIP PLACEMENT  08/20/2023   Procedure: HEMOSTASIS CLIP PLACEMENT;  Surgeon: Maryruth Ole DASEN, MD;  Location: [facility];  Service: Endoscopy;;   KNEE SURGERY  2005   2 times left   KNEE SURGERY Right    KYPHOPLASTY N/A 07/20/2021   Procedure: T 10KYPHOPLASTY;  Surgeon: Kathlynn Sharper, MD;  Location: [facility];  Service: Orthopedics;  Laterality: N/A;   KYPHOPLASTY N/A 08/23/2021   Procedure: T9 KYPHOPLASTY;  Surgeon: Kathlynn Sharper, MD;  Location: [facility];  Service: Orthopedics;  Laterality: N/A;   POLYPECTOMY  07/04/2020   Procedure: POLYPECTOMY;  Surgeon: Shila Gustav GAILS, MD;  [facility];  Service: Endoscopy;;   POLYPECTOMY  08/20/2023   Procedure: POLYPECTOMY;  Surgeon: Maryruth Ole DASEN, MD;  Location: [facility];  Service: Endoscopy;;   SUBMUCOSAL TATTOO INJECTION  08/20/2023   Procedure: SUBMUCOSAL TATTOO INJECTION;  Surgeon: Maryruth Ole DASEN, MD;  Location: [facility];  Service: Endoscopy;;   TUBAL LIGATION        OBJECTIVE:  Vitals:   07/29/24 0836 07/29/24 0837  BP:  129/85  Pulse:  90  Resp:  18  Temp:  98.1 F (36.7 C)  TempSrc:  Oral  SpO2:  98%  Weight: 85.7 kg     General appearance: alert; no distress HEENT: [place]; AT Neck: supple with FROM Resp: unlabored respirations Extremities: LUE: warm with well perfused appearance; poorly localized moderate tenderness over left shoulder and over L trapezius; without gross deformities; swelling: none; bruising: none; shoudler ROM: normal, with reported discomfort CV: brisk extremity capillary refill of LUE; 2+ radial pulse of LUE. Skin:  warm and dry; no visible rashes Neurologic: gait normal; normal sensation and strength of LUE Psychological: alert and cooperative; normal mood and affect  Imaging: DG Shoulder Left Result Date: 07/29/2024 CLINICAL DATA:  Pain after a fall. EXAM: LEFT SHOULDER - 2+ VIEW COMPARISON:  None Available. FINDINGS: There is no evidence of fracture or dislocation. There is no evidence of arthropathy or other focal bone abnormality. Soft tissues are unremarkable. IMPRESSION: Negative. Electronically Signed   By: Camellia Candle M.D.   On: 07/29/2024 10:22      Allergies  Allergen Reactions   Angiotensin Receptor Blockers Swelling    Angioedema   Meperidine Hives and Anaphylaxis   Strawberry Extract Hives and Swelling   Sucralfate  Nausea Only and Other (See Comments)    Severe nausea Other reaction(s): Other (See Comments) Severe nausea Severe nausea   Wasp Venom Hives    Past Medical History:  Diagnosis Date   Allergy    Anemia    Anxiety    Arthritis    CHF (congestive heart failure) (HCC)    Chronic kidney disease    COPD (chronic obstructive pulmonary disease) (HCC)    Depression    GERD (gastroesophageal reflux disease)    Headache    Hyperlipidemia    Hypertension    MVP (mitral valve prolapse)  Parkinson disease (HCC)    Vascular dementia (HCC)    Social History   Socioeconomic History   Marital status: Single    Spouse name: Not on file   Number of children: 2   Years of education: Not on file   Highest education level: Some college, no degree  Occupational History   Not on file  Tobacco Use   Smoking status: Former    Current packs/day: 0.00    Average packs/day: 1 pack/day for 15.0 years (15.0 ttl pk-yrs)    Types: Cigarettes    Start date: 09/12/2002    Quit date: 09/12/2017    Years since quitting: 6.8    Passive exposure: Past   Smokeless tobacco: Never  Vaping Use   Vaping status: Never Used  Substance and Sexual Activity   Alcohol  use: Never    Drug use: No   Sexual activity: Not Currently  Other Topics Concern   Not on file  Social History Narrative   Lives with Grandson's great grandparents    Grandson - 75 years old ([place])   Daughter - Bruno - [place]   Support system - family, aunt, Inocente and Christopher (who she lives with), brother   Transportation: roommates   Enjoy: [employer], playing with grandson   Exercise: PT exercises   Diet: good - chicken, some steak, tries to eat healthy, avoids fried foods   Retired from being a Lawyer   Social Drivers of Corporate investment banker Strain: Low Risk  (02/03/2024)   Overall Financial Resource Strain (CARDIA)    Difficulty of Paying Living Expenses: Not very hard  Food Insecurity: No Food Insecurity (02/03/2024)   Hunger Vital Sign    Worried About Running Out of Food in the Last Year: Never true    Ran Out of Food in the Last Year: Never true  Transportation Needs: No Transportation Needs (02/03/2024)   PRAPARE - Administrator, Civil Service (Medical): No    Lack of Transportation (Non-Medical): No  Physical Activity: Insufficiently Active (02/03/2024)   Exercise Vital Sign    Days of Exercise per Week: 2 days    Minutes of Exercise per Session: 30 min  Stress: Stress Concern Present (02/03/2024)   [employer] of Occupational Health - Occupational Stress Questionnaire    Feeling of Stress : To some extent  Social Connections: Moderately Isolated (02/03/2024)   Social Connection and Isolation Panel    Frequency of Communication with Friends and Family: More than three times a week    Frequency of Social Gatherings with Friends and Family: Once a week    Attends Religious Services: More than 4 times per year    Active Member of [employer] or Organizations: No    Attends Banker Meetings: Never    Marital Status: Divorced   Family History  Problem Relation Age of Onset   Breast cancer Mother 41   Ovarian cancer Mother 39   Brain  cancer Mother    Hypertension Father    Hyperlipidemia Father    Heart disease Father    Colon cancer Father        diagnosed in his 72s   Breast cancer Maternal Grandmother 7   Diabetes Paternal Grandmother    Breast cancer Paternal Grandmother 84   Other Son        drug over dose   Colon cancer Maternal Grandfather        dx in his 26s  Liver disease Maternal Grandfather    Breast cancer Maternal Aunt 60   Breast cancer Maternal Aunt 60   Colon cancer Maternal Uncle    Stomach cancer Neg Hx    Pancreatic cancer Neg Hx    Esophageal cancer Neg Hx    Past Surgical History:  Procedure Laterality Date   ABDOMINAL HYSTERECTOMY     APPENDECTOMY     BALLOON DILATION N/A 04/21/2020   Procedure: BALLOON DILATION;  Surgeon: Teressa Toribio SQUIBB, MD;  [facility]: [facility];  Service: Endoscopy;  Laterality: N/A;  pyloric/ GI   BALLOON DILATION N/A 07/04/2020   Procedure: BALLOON DILATION;  Surgeon: Shila Gustav GAILS, MD;  [facility];  Service: Endoscopy;  Laterality: N/A;   BIOPSY  04/21/2020   Procedure: BIOPSY;  Surgeon: Teressa Toribio SQUIBB, MD;  [facility];  Service: Endoscopy;;   BIOPSY  07/04/2020   Procedure: BIOPSY;  Surgeon: Shila Gustav GAILS, MD;  [facility];  Service: Endoscopy;;  EGD and COLON   BREAST SURGERY Right 1988   tumor removal   CHOLECYSTECTOMY N/A 05/18/2020   Procedure: LAPAROSCOPIC CHOLECYSTECTOMY;  Surgeon: Gladis Cough, MD;  [facility];  Service: General;  Laterality: N/A;   COLONOSCOPY WITH PROPOFOL  N/A 07/04/2020   Procedure: COLONOSCOPY WITH PROPOFOL ;  Surgeon: Shila Gustav GAILS, MD;  [facility];  Service: Endoscopy;  Laterality: N/A;   COLONOSCOPY WITH PROPOFOL  N/A 11/03/2021   Procedure: COLONOSCOPY WITH PROPOFOL ;  Surgeon: Maryruth Ole DASEN, MD;  Location: [facility];  Service: Endoscopy;  Laterality: N/A;   COLONOSCOPY WITH PROPOFOL  N/A 08/20/2023   Procedure: COLONOSCOPY WITH PROPOFOL ;  Surgeon:  Maryruth Ole DASEN, MD;  Location: [facility];  Service: Endoscopy;  Laterality: N/A;   ESOPHAGEAL DILATION  08/11/2020   Procedure: PYLORIC DILATION;  Surgeon: Legrand Victory LITTIE DOUGLAS, MD;  [facility];  Service: Gastroenterology;;   ESOPHAGOGASTRODUODENOSCOPY (EGD) WITH PROPOFOL  N/A 04/21/2020   Procedure: ESOPHAGOGASTRODUODENOSCOPY (EGD) WITH PROPOFOL ;  Surgeon: Teressa Toribio SQUIBB, MD;  [facility];  Service: Endoscopy;  Laterality: N/A;   ESOPHAGOGASTRODUODENOSCOPY (EGD) WITH PROPOFOL  N/A 07/04/2020   Procedure: ESOPHAGOGASTRODUODENOSCOPY (EGD) WITH PROPOFOL ;  Surgeon: Shila Gustav GAILS, MD;  [facility];  Service: Endoscopy;  Laterality: N/A;   ESOPHAGOGASTRODUODENOSCOPY (EGD) WITH PROPOFOL  N/A 08/11/2020   Procedure: ESOPHAGOGASTRODUODENOSCOPY (EGD) WITH PROPOFOL ;  Surgeon: Legrand Victory LITTIE DOUGLAS, MD;  [facility];  Service: Gastroenterology;  Laterality: N/A;   ESOPHAGOGASTRODUODENOSCOPY (EGD) WITH PROPOFOL  N/A 11/03/2021   Procedure: ESOPHAGOGASTRODUODENOSCOPY (EGD) WITH PROPOFOL ;  Surgeon: Maryruth Ole DASEN, MD;  Location: [facility];  Service: Endoscopy;  Laterality: N/A;   ESOPHAGOGASTRODUODENOSCOPY (EGD) WITH PROPOFOL  N/A 05/07/2022   Procedure: ESOPHAGOGASTRODUODENOSCOPY (EGD) WITH PROPOFOL ;  Surgeon: Unk Corinn Skiff, MD;  Location: [facility];  Service: Gastroenterology;  Laterality: N/A;   HEMOSTASIS CLIP PLACEMENT  08/20/2023   Procedure: HEMOSTASIS CLIP PLACEMENT;  Surgeon: Maryruth Ole DASEN, MD;  Location: [facility];  Service: Endoscopy;;   KNEE SURGERY  2005   2 times left   KNEE SURGERY Right    KYPHOPLASTY N/A 07/20/2021   Procedure: T 10KYPHOPLASTY;  Surgeon: Kathlynn Sharper, MD;  Location: [facility];  Service: Orthopedics;  Laterality: N/A;   KYPHOPLASTY N/A 08/23/2021   Procedure: T9 KYPHOPLASTY;  Surgeon: Kathlynn Sharper, MD;  Location: [facility];  Service: Orthopedics;  Laterality: N/A;   POLYPECTOMY  07/04/2020   Procedure:  POLYPECTOMY;  Surgeon: Shila Gustav GAILS, MD;  [facility];  Service: Endoscopy;;   POLYPECTOMY  08/20/2023   Procedure:  POLYPECTOMY;  Surgeon: Maryruth Ole DASEN, MD;  [facility];  Service: Endoscopy;;   SUBMUCOSAL TATTOO INJECTION  08/20/2023   Procedure: SUBMUCOSAL TATTOO INJECTION;  Surgeon: Maryruth Ole DASEN, MD;  Location: [facility];  Service: Endoscopy;;   RICKA LINNA Rolinda Rogue, MD 07/29/24 1408

## 2024-07-29 NOTE — ED Triage Notes (Signed)
 I was walking up some steps and lost my balance because of this. I though it would get better but it,s only gotten worse. The pain radiates down my back and up my arm and neck on the left side.

## 2024-07-31 ENCOUNTER — Emergency Department

## 2024-07-31 ENCOUNTER — Emergency Department
Admission: EM | Admit: 2024-07-31 | Discharge: 2024-07-31 | Disposition: A | Discharge status: Home / Self Care | Attending: Emergency Medicine | Admitting: Emergency Medicine

## 2024-07-31 ENCOUNTER — Ambulatory Visit: Payer: Self-pay | Admitting: *Deleted

## 2024-07-31 ENCOUNTER — Other Ambulatory Visit: Payer: Self-pay

## 2024-07-31 DIAGNOSIS — B029 Zoster without complications: Secondary | ICD-10-CM | POA: Diagnosis not present

## 2024-07-31 DIAGNOSIS — S0990XA Unspecified injury of head, initial encounter: Secondary | ICD-10-CM | POA: Diagnosis not present

## 2024-07-31 DIAGNOSIS — Z043 Encounter for examination and observation following other accident: Secondary | ICD-10-CM | POA: Diagnosis not present

## 2024-07-31 DIAGNOSIS — S199XXA Unspecified injury of neck, initial encounter: Secondary | ICD-10-CM | POA: Diagnosis not present

## 2024-07-31 DIAGNOSIS — D631 Anemia in chronic kidney disease: Secondary | ICD-10-CM | POA: Diagnosis not present

## 2024-07-31 DIAGNOSIS — W19XXXA Unspecified fall, initial encounter: Secondary | ICD-10-CM | POA: Diagnosis not present

## 2024-07-31 DIAGNOSIS — Q760 Spina bifida occulta: Secondary | ICD-10-CM | POA: Diagnosis not present

## 2024-07-31 DIAGNOSIS — M542 Cervicalgia: Secondary | ICD-10-CM | POA: Diagnosis not present

## 2024-07-31 DIAGNOSIS — J449 Chronic obstructive pulmonary disease, unspecified: Secondary | ICD-10-CM | POA: Insufficient documentation

## 2024-07-31 DIAGNOSIS — I5032 Chronic diastolic (congestive) heart failure: Secondary | ICD-10-CM | POA: Insufficient documentation

## 2024-07-31 DIAGNOSIS — N1831 Chronic kidney disease, stage 3a: Secondary | ICD-10-CM | POA: Insufficient documentation

## 2024-07-31 DIAGNOSIS — I130 Hypertensive heart and chronic kidney disease with heart failure and stage 1 through stage 4 chronic kidney disease, or unspecified chronic kidney disease: Secondary | ICD-10-CM | POA: Diagnosis not present

## 2024-07-31 DIAGNOSIS — M8580 Other specified disorders of bone density and structure, unspecified site: Secondary | ICD-10-CM | POA: Diagnosis not present

## 2024-07-31 DIAGNOSIS — G20A1 Parkinson's disease without dyskinesia, without mention of fluctuations: Secondary | ICD-10-CM | POA: Insufficient documentation

## 2024-07-31 DIAGNOSIS — M25512 Pain in left shoulder: Secondary | ICD-10-CM | POA: Diagnosis not present

## 2024-07-31 DIAGNOSIS — R519 Headache, unspecified: Secondary | ICD-10-CM | POA: Diagnosis not present

## 2024-07-31 DIAGNOSIS — I6529 Occlusion and stenosis of unspecified carotid artery: Secondary | ICD-10-CM | POA: Diagnosis not present

## 2024-07-31 MED ORDER — MORPHINE SULFATE (PF) 2 MG/ML IV SOLN
2.0000 mg | Freq: Once | INTRAVENOUS | Status: AC
  Administered 2024-07-31: 2 mg via INTRAMUSCULAR

## 2024-07-31 MED ORDER — VALACYCLOVIR HCL 1 G PO TABS: 1000.0000 mg | ORAL_TABLET | Freq: Three times a day (TID) | ORAL | 0 refills | Status: DC

## 2024-07-31 MED ORDER — KETOROLAC TROMETHAMINE 15 MG/ML IJ SOLN
15.0000 mg | Freq: Once | INTRAMUSCULAR | Status: AC
  Administered 2024-07-31: 15 mg via INTRAVENOUS
  Filled 2024-07-31: qty 1

## 2024-07-31 MED ORDER — NAPROXEN 500 MG PO TABS
500.0000 mg | ORAL_TABLET | Freq: Two times a day (BID) | ORAL | 0 refills | Status: AC
Start: 2024-07-31 — End: 2024-08-07

## 2024-07-31 MED ORDER — MORPHINE SULFATE (PF) 2 MG/ML IV SOLN
2.0000 mg | Freq: Once | INTRAVENOUS | Status: DC
  Filled 2024-07-31: qty 1

## 2024-07-31 MED ORDER — ACETAMINOPHEN 325 MG PO TABS
650.0000 mg | ORAL_TABLET | Freq: Once | ORAL | Status: AC
  Administered 2024-07-31: 650 mg via ORAL
  Filled 2024-07-31: qty 2

## 2024-07-31 MED ORDER — LIDOCAINE 5 % EX PTCH
1.0000 | MEDICATED_PATCH | Freq: Two times a day (BID) | CUTANEOUS | 0 refills | Status: AC
Start: 2024-07-31 — End: 2024-08-05

## 2024-07-31 MED ORDER — MORPHINE SULFATE (PF) 4 MG/ML IV SOLN: 4.0000 mg | Freq: Once | INTRAVENOUS | Status: DC

## 2024-07-31 MED ORDER — VALACYCLOVIR HCL 1 G PO TABS
1000.0000 mg | ORAL_TABLET | Freq: Two times a day (BID) | ORAL | 0 refills | Status: DC
Start: 2024-07-31 — End: 2024-08-07

## 2024-07-31 MED ORDER — METOCLOPRAMIDE HCL 5 MG/ML IJ SOLN
10.0000 mg | Freq: Once | INTRAMUSCULAR | Status: AC
  Administered 2024-07-31: 10 mg via INTRAVENOUS
  Filled 2024-07-31: qty 2

## 2024-07-31 NOTE — ED Triage Notes (Signed)
 Pt comes with fall. Pt states she lost her balance and fel. Pt states left neck and shoulder pain.

## 2024-07-31 NOTE — Telephone Encounter (Signed)
 Please call her and have [facility] (urgent care) this would be much better for her specific need and they can do imaging on site.

## 2024-07-31 NOTE — Telephone Encounter (Signed)
 FYI Only or Action Required?: Action required by provider: request for appointment.  Patient was last seen in primary care on 05/19/2024 by Morgan Antu, FNP.  Called Nurse Triage reporting Shoulder Pain.  Symptoms began several days ago.  Interventions attempted: Prescription medications: muscle relaxer.  Symptoms are: rapidly worsening.  Triage Disposition: Go to ED Now (or PCP Triage)  Patient/caregiver understands and will follow disposition?: No, wishes to speak with PCP            Copied from CRM #[id number]. Topic: Clinical - Red Word Triage >> Jul 31, 2024  8:04 AM Adelita E wrote: [facility] that prompted transfer to Nurse Triage: Left shoulder pain. Patient fell last "Sunday, went to urgent care on 8/13 and the pain is now worse. Reason for Disposition  [1] SEVERE pain AND [2] not improved 2 hours after pain medicine  Answer Assessment - Initial Assessment Questions Per protocol recommended ED . Muscle relaxer's given per UC visit not helping and pain now shooting into neck left arm /finger numbness. Patient declined ED and wants to see PCP or other provider first please advise CAL notified patient declined ED.     1. ONSET: When did the pain start?     SUnday  2. LOCATION: Where is the pain located?    Left shoulder  3. PAIN: How bad is the pain? (Scale 1-10; or mild, moderate, severe)     10" /10 4. WORK OR EXERCISE: Has there been any recent work or exercise that involved this part of the body?     Fell on right elbow and landed on left shoulder 5. CAUSE: What do you think is causing the shoulder pain?     Fall Sunday  6. OTHER SYMPTOMS: Do you have any other symptoms? (e.g., neck pain, swelling, rash, fever, numbness, weakness)     Sharp Neck pain , left shoulder pain numbness 7. PREGNANCY: Is there any chance you are pregnant? When was your last menstrual period?     na  Protocols used: Shoulder Pain-A-AH

## 2024-07-31 NOTE — Telephone Encounter (Signed)
 Spoke with pt and she is aware of Tabitha's message. She verbalized understanding.

## 2024-07-31 NOTE — ED Provider Notes (Signed)
 [facility] Provider Note    Event Date/Time   First MD Initiated Contact with Patient 07/31/24 1206     (approximate)   History   Fall   HPI  Morgan [name] is a 73 y.o. female who presents today for evaluation of left-sided shoulder pain and neck pain after a fall.  Patient reports that she tripped because of a boot that she has to wear on her left foot for 3 weeks, and landed on her left shoulder.  She reports that she also struck her head.  She reports that she also fell 3 days ago for the same reason.  She feels that she has pain in her left arm.  She does not feel weak.  She has not had any vomiting.  There is no LOC.  She has been able to ambulate since this happened.  Patient reports that she has had a stroke in the past but does not have any residual deficits.  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 07/23/2024   Osteoarthritis of right knee 04/24/2024   Seasonal allergic rhinitis due to pollen 04/22/2024   Heart murmur, systolic 02/03/2024   BRBPR (bright red blood per rectum) 12/06/2023   Lower abdominal pain 12/06/2023   Anxiety state 10/22/2023   At high risk for falls 08/30/2023   Multiple falls 08/30/2023   History of angiotensin II receptor antagonist allergy 07/03/2023   Angioedema 06/21/2023   Bilateral tinnitus 05/20/2023   History of syncope 05/20/2023   LOC (loss of consciousness) (HCC) 04/04/2023   Adjustment reaction with anxiety and depression 02/05/2023   Chronic diarrhea 02/05/2023   Carotid stenosis 10/02/2022   Gastroparesis 09/27/2022   Lipoma of colon 09/21/2022   Atherosclerosis of aorta (HCC) 09/21/2022   Diverticulosis  09/21/2022   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   History of ischemic stroke 09/21/2022   History of colon polyps 09/21/2022   Parkinson disease (HCC)    Migraine, unspecified, not intractable, without status migrainosus 04/16/2022   Hemiplegia, unspecified affecting left nondominant side (HCC) 04/16/2022   Hypertensive heart and chronic kidney disease with heart failure and stage 1 through stage 4 chronic kidney disease, or unspecified chronic kidney disease (HCC) 04/16/2022   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   Hemiplegic migraine, not intractable, 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   Vitamin B12 deficiency 02/20/2022   Vitamin D  deficiency 02/20/2022   Vascular dementia, unspecified severity, with anxiety (HCC) 01/10/2022   Tremor 10/27/2021   Stage 3a chronic kidney disease (HCC) 09/11/2021   IDA (iron  deficiency anemia) 09/11/2021   Osteopenia 08/28/2021   Chronic diastolic CHF (congestive heart failure) (HCC) 08/09/2020   Pyloric stenosis in adult    Peptic ulcer disease 03/22/2020   Chronic abdominal pain 02/19/2019   CKD (chronic kidney disease) stage 3, GFR 30-59 ml/min (HCC) 01/13/2019   Oropharyngeal dysphagia 11/12/2018   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/2018   COPD (chronic obstructive pulmonary disease) (HCC) 01/14/2018   Non-rheumatic mitral regurgitation 11/21/2017   HLD (hyperlipidemia) 10/04/2017   GERD (gastroesophageal reflux disease) 10/04/2017   Left hemiparesis (HCC) 10/04/2017   Essential hypertension    Chronic lower back pain  04/29/2013   Paresthesia 04/29/2013          Physical Exam   Triage Vital Signs: ED Triage Vitals  Encounter Vitals Group     BP 07/31/24 1140 (!) 147/107     Girls Systolic BP Percentile --      Girls Diastolic BP Percentile --      Boys Systolic BP Percentile --      Boys Diastolic BP Percentile --      Pulse Rate 07/31/24 1140 86     Resp  07/31/24 1140 19     Temp 07/31/24 1140 98.6 F (37 C)     Temp src --      SpO2 07/31/24 1140 100 %     Weight 07/31/24 1138 189 lb (85.7 kg)     Height 07/31/24 1138 6' 1 (1.854 m)     Head Circumference --      Peak Flow --      Pain Score 07/31/24 1138 10     Pain Loc --      Pain Education --      Exclude from Growth Chart --     Most recent vital signs: Vitals:   07/31/24 1140 07/31/24 1628  BP: (!) 147/107 138/88  Pulse: 86 80  Resp: 19 18  Temp: 98.6 F (37 C) 98 F (36.7 C)  SpO2: 100% 100%    Physical Exam Vitals and nursing note reviewed.  Constitutional:      General: Awake and alert. No acute distress.    Appearance: Normal appearance. The patient is normal weight.  HENT:     Head: Normocephalic and atraumatic.     Mouth: Mucous membranes are moist.  Eyes:     General: PERRL. Normal EOMs        Right eye: No discharge.        Left eye: No discharge.     Conjunctiva/sclera: Conjunctivae normal.  Cardiovascular:     Rate and Rhythm: Normal rate and regular rhythm.     Pulses: Normal pulses.  Pulmonary:     Effort: Pulmonary effort is normal. No respiratory distress.     Breath sounds: Normal breath sounds.  Abdominal:     Abdomen is soft. There is no abdominal tenderness. No rebound or guarding. No distention. Musculoskeletal:        General: No swelling. Normal range of motion.     Cervical back: Normal range of motion and neck supple.  No midline cervical spine tenderness.  Tenderness to left cervical paraspinal muscle area.  Full range of motion of neck.  Negative Hoffmann sign.  Normal strength and sensation in bilateral upper extremities. Normal grip strength bilaterally.  Normal intrinsic muscle function of the hand bilaterally.  Normal radial pulses bilaterally.  Mild difficulty holding cervical with thumb and forefinger on the left side as compared to his right.  No clonus bilaterally. Left shoulder: No obvious deformity, swelling, ecchymosis,  or erythema No clavicular or AC joint tenderness Able to actively and passively forward flex and abduct at shoulder fully though pain past 90 degrees, negative drop arm test Normal ROM at elbow and wrist Normal resisted pronation and supination 2+ radial pulse Normal grip strength Normal intrinsic hand muscle function Skin:    General: Skin is warm and dry.     Capillary Refill: Capillary refill takes less than 2 seconds.     Findings: Patchy erythematous rash with vesicles noted to the left anterior shoulder Neurological:     Mental  Status: The patient is awake and alert.   Neurological: GCS 15 alert and oriented x3 Normal speech, no expressive or receptive aphasia or dysarthria Cranial nerves II through XII intact Normal visual fields 5 out of 5 strength in all 4 extremities with intact sensation throughout No extremity drift Normal finger-to-nose testing, no limb or truncal ataxia   ED Results / Procedures / Treatments   Labs (all labs ordered are listed, but only abnormal results are displayed) Labs Reviewed - No data to display   EKG     RADIOLOGY I independently reviewed and interpreted imaging and agree with radiologists findings.     PROCEDURES:  Critical Care performed:   Procedures   MEDICATIONS ORDERED IN ED: Medications  acetaminophen  (TYLENOL ) tablet 650 mg (650 mg Oral Given 07/31/24 1240)  morphine  (PF) 2 MG/ML injection 2 mg (2 mg Intramuscular Given 07/31/24 1308)  ketorolac  (TORADOL ) 15 MG/ML injection 15 mg (15 mg Intravenous Given 07/31/24 1500)  metoCLOPramide  (REGLAN ) injection 10 mg (10 mg Intravenous Given 07/31/24 1500)     IMPRESSION / MDM / ASSESSMENT AND PLAN / ED COURSE  I reviewed the triage vital signs and the nursing notes.   Differential diagnosis includes, but is not limited to, cervical spine injury, rotator cuff injury, fracture, dislocation, intracranial hemorrhage.  Patient is awake and alert, hemodynamically stable and  afebrile.  She is nontoxic in appearance.  I reviewed the patient's chart.  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  Per that note, patient has mild left-sided weakness that has been present for a couple of years.  Patient has pain to her anterior and lateral left shoulder and neck area.  She does have a rash in this area with vesicles that may be consistent with shingles.  Reports that she has had shingles in the past, to the left side of her face.  Patient did not know that she had this rash here.  It is possible that this is also contributing to her pain.    CT head and neck obtained are normal.  Shoulder x-ray is normal.  Given her pain and the mild difficulty holding thumb and forefinger closed against resistance, will obtain MRI for evaluation of central cord syndrome or nerve injury.  She was treated symptomatically with good effect in the meantime.  MRI is negative for acute cervical spine injury.  She has diffuse cervical disc and facet degeneration.  Upon reevaluation after adequate pain control she feels significantly improved and comfortable with discharge home.  Will treat for shingles given her vesicular rash in the area of her pain.  Creatinine clearance calculated, will provide 1 g twice daily.  Per chart review, normal creatinine recommended symptomatic management and return precautions.  Patient's presentation is most consistent with acute presentation with potential threat to life or bodily function.      FINAL CLINICAL IMPRESSION(S) / ED DIAGNOSES   Final diagnoses:  Fall, initial encounter  Herpes zoster without complication  Acute pain of left shoulder  Injury of head, initial encounter     Rx / DC Orders   ED Discharge Orders          Ordered    valACYclovir  (VALTREX ) 1000 MG tablet  3 times daily,   Status:  Discontinued        07/31/24 1613    naproxen  (NAPROSYN ) 500 MG tablet  2 times daily with meals        07/31/24  1613    lidocaine  (LIDODERM )  5 %  Every 12 hours        07/31/24 1613    valACYclovir  (VALTREX ) 1000 MG tablet  2 times daily        07/31/24 1757             Note:  This document was prepared using Dragon voice recognition software and may include unintentional dictation errors.   Kelijah Towry E, PA-C 07/31/24 ROSENA Dorothyann Drivers, MD 08/03/24 2240

## 2024-07-31 NOTE — Discharge Instructions (Signed)
 Your CT scans and MRI are normal.  Your shoulder x-ray is normal.  Immediate the medications to help with your pain.  Given the rash to the area of your pain, you were started on Valtrex  to treat for shingles.  Please return for any new, worsening, or change in symptoms or other concerns.  It was a pleasure caring for you today.

## 2024-07-31 NOTE — ED Notes (Signed)
 Patient is in CT and CT will bring patient to room when completes test.

## 2024-08-04 ENCOUNTER — Telehealth: Payer: Self-pay | Admitting: Family

## 2024-08-04 DIAGNOSIS — R1114 Bilious vomiting: Secondary | ICD-10-CM

## 2024-08-04 MED ORDER — ONDANSETRON HCL 4 MG PO TABS: 4.0000 mg | ORAL_TABLET | Freq: Three times a day (TID) | ORAL | 0 refills | Status: DC | PRN

## 2024-08-04 NOTE — Addendum Note (Signed)
 Addended by: CORWIN ANTU on: 08/04/2024 01:15 PM   Modules accepted: Orders

## 2024-08-04 NOTE — Telephone Encounter (Signed)
 Spoke with pt and she is aware of Tabitha's recommendations. She verbalized understanding. Nothing further was needed.

## 2024-08-04 NOTE — Telephone Encounter (Signed)
 Copied from CRM #[id number]. Topic: Clinical - Medical Advice >> Aug 04, 2024 11:12 AM Morgan [name] wrote: Reason for CRM: Patient is calling in due to going to the ER on Friday and was diagnosed with shingles they prescribed medication and it is has made her very sick and throwing up, patient is asking if a different medication can be called in for it and also a nausea medication.  Please advise patient

## 2024-08-04 NOTE — Telephone Encounter (Signed)
 I am assuming this was valtrex .  Stop valtrex . I would not advise we start anything else as this valtrex  is mainly an antiviral that will decrease the duration of the course of the shingles but if it is making her throw up then we wouldn't give anything else.   Keep us  updated if symptoms get worse Sending in zofran .

## 2024-08-11 ENCOUNTER — Ambulatory Visit: Payer: Self-pay

## 2024-08-11 NOTE — Telephone Encounter (Signed)
 FYI Only or Action Required?: Action required by provider: request for appointment.  Patient was last seen in primary care on 05/19/2024 by Corwin Antu, FNP.  Called Nurse Triage reporting Herpes Zoster.  Symptoms began several days ago.  Interventions attempted: OTC medications: calamine lotion.  Symptoms are: gradually worsening.  Triage Disposition: See Physician Within 24 Hours  Patient/caregiver understands and will follow disposition?: Yes     Copied from CRM [id number]. Topic: Clinical - Red Word Triage >> Aug 11, 2024  2:47 PM [place] J wrote: [facility] that prompted transfer to Nurse Triage: Patient's shingles are spreading and worsening. She is still nauseas and is tired of the burning, and itching sensations. Reason for Disposition  [1] Shingles rash AND [2] onset > 72 hours ago (3 days)  Answer Assessment - Initial Assessment Questions Scheduled appt tomorrow, no available appts today. Advised UC/ED if symptoms worsen.  Denies diffculty breathing, fever, blurred vision Reports HA, n/v,numbness tingling to rash Stopped Valtrex  previously due to possible caused nausea. Uses Calamine lotin and bendadryl cream applying to rash, still nauseated all the time.  1. APPEARANCE of RASH: What does the rash look like?      Rash is starting to spread, burning and painful 2. LOCATION: Where is the rash located?      Was intitially on chest, spreaded to chin, neck on left and back of neck, back to rt chin, back, shoulder left; upper left side of body 3. ONSET: When did the rash start?      2 weeks started 4. ITCHING: Does the rash itch? If Yes, ask: How bad is the itch?  (Scale 1-10; or mild, moderate, severe)     severe 5. PAIN: Does the rash hurt? If Yes, ask: How bad is the pain?  (Scale 0-10; or none, mild, moderate, severe)     9/10 6. OTHER SYMPTOMS: Do you have any other symptoms? (e.g., fever)     Itching burns tingling  Protocols used: Shingles  (Zoster)-A-AH

## 2024-08-12 ENCOUNTER — Telehealth: Payer: Self-pay

## 2024-08-12 ENCOUNTER — Other Ambulatory Visit (HOSPITAL_COMMUNITY): Payer: Self-pay

## 2024-08-12 ENCOUNTER — Ambulatory Visit (INDEPENDENT_AMBULATORY_CARE_PROVIDER_SITE_OTHER): Admitting: Family Medicine

## 2024-08-12 VITALS — BP 122/88 | HR 90 | Temp 98.2°F | Ht 73.0 in | Wt 192.0 lb

## 2024-08-12 DIAGNOSIS — M5090 Cervical disc disorder, unspecified, unspecified cervical region: Secondary | ICD-10-CM | POA: Diagnosis not present

## 2024-08-12 DIAGNOSIS — B029 Zoster without complications: Secondary | ICD-10-CM

## 2024-08-12 MED ORDER — PREGABALIN 25 MG PO CAPS: 25.0000 mg | ORAL_CAPSULE | Freq: Two times a day (BID) | ORAL | 0 refills | Status: DC | PRN

## 2024-08-12 MED ORDER — PREDNISONE 20 MG PO TABS: ORAL_TABLET | ORAL | 0 refills | Status: DC

## 2024-08-12 NOTE — Assessment & Plan Note (Addendum)
 Acute, now completed 2 weeks of rash, no clear new vesicles appearing but pain very poorly controlled.  Concern for postherpetic neuralgia.  Encouraged her to use Tylenol  Extra Strength 2 tablets 3 times a day but not more frequently.  Continue to control nausea with Phenergan . Will start Lyrica  25 mg p.o. twice daily and if she tolerates this she can increase the dose to 3 times daily for better pain control.  She will call if her pain is not controlled.  Return and ER precautions reviewed in detail with patient.  I will also start prednisone  to help with inflammation and pain more from the possible cervical disc disease Association.

## 2024-08-12 NOTE — Telephone Encounter (Signed)
 Copied from CRM [id number]. Topic: Clinical - Medication Prior Auth >> Aug 12, 2024  9:42 AM Burnard DEL wrote: Reason for CRM: Pateint called in stating that medication  pregabalin  (LYRICA ) 25 MG capsule needs a PA.

## 2024-08-12 NOTE — Assessment & Plan Note (Signed)
 Acute, possible irritation of the nerve roots at her neck given she does note some numbness in her neck and pain from neck down to left arm. Will treat with prednisone  40 mg x 5 days then 20 mg x 5 days. Hold nonsteroidal anti-inflammatories while on this medication.

## 2024-08-12 NOTE — Telephone Encounter (Signed)
 Pt was seen in office today

## 2024-08-12 NOTE — Telephone Encounter (Signed)
 Pharmacy Patient Advocate Encounter  Received notification from [facility] that Prior Authorization for Pregabalin  25MG  capsules  has been APPROVED from 08/12/24 to 12/16/24. Unable to obtain price due to refill too soon rejection, last fill date 08/12/24 next available fill date9/7/25   PA #/Case ID/Reference #: [id number]

## 2024-08-12 NOTE — Telephone Encounter (Signed)
 Pharmacy Patient Advocate Encounter   Received notification from Pt Calls Messages that prior authorization for Pregabalin  25MG  capsules  is required/requested.   Insurance verification completed.   The patient is insured through [facility] .   Per test claim: PA required; PA submitted to above mentioned insurance via Latent Key/confirmation #/EOC [id number] Status is pending

## 2024-08-12 NOTE — Progress Notes (Signed)
 Patient ID: Morgan [name], female    DOB: 11-Oct-1951, 73 y.o.   MRN: [id number]  This visit was conducted in person.  BP 122/88   Pulse 90   Temp 98.2 F (36.8 C) (Oral)   Ht 6' 1 (1.854 m)   Wt 192 lb (87.1 kg) Comment: with boot on L foot.  SpO2 96%   BMI 25.33 kg/m    CC:  Chief Complaint  Patient presents with   Rash    Pt complains of shingles/rash that has not gone away. States of ongoing for a couple weeks. Complains of having a flare up, tingling and itching.     Subjective:   HPI: Morgan [name] is a 73 y.o. female presenting on 08/12/2024 for Rash (Pt complains of shingles/rash that has not gone away. States of ongoing for a couple weeks. Complains of having a flare up, tingling and itching. )   Patient of Ginger Patrick seen in ED on August 15 status post accidental fall resulting in left sided shoulder and neck pain. At that time it was noted she had a rash on her left upper chest and shoulder... Diagnosed with herpes zoster. CT head and neck were unremarkable.  Shoulder x-ray normal. MRI of neck was negative for acute cervical spine injury but showed evidence of disc Fuhs cervical disc disease and facet degeneration. Started on valacyclovir , recommended naproxen  and lidocaine  patches.  She states that she took the valacyclovir  for about 3 days but felt very nauseous and ill.  She had to stop this medication.  She has been treating the rash with calamine and Benadryl .  She is using Tylenol  1500 mg every 2-3 hours for pain.  This is not controlling pain well at all. She continues to have nausea and occasional vomiting but is overall tolerating p.o. intake now that she is using Phenergan  as needed. She denies fever.  She feels like the rash has spread since initial issue but does not appear to have cross the midline.  She does feel some hypersensitivity on the right, but there is no rash.      Relevant past medical, surgical, family and social history reviewed and  updated as indicated. Interim medical history since our last visit reviewed. Allergies and medications reviewed and updated. Outpatient Medications Prior to Visit  Medication Sig Dispense Refill   acetaminophen  (TYLENOL ) 325 MG tablet Take 2 tablets (650 mg total) by mouth every 4 (four) hours as needed for mild pain (or temp > 37.5 C (99.5 F)).     albuterol  (VENTOLIN  HFA) 108 (90 Base) MCG/ACT inhaler Inhale 2 puffs into the lungs every 6 (six) hours as needed for wheezing or shortness of breath. 1 each 11   Budeson-Glycopyrrol-Formoterol  (BREZTRI  AEROSPHERE) 160-9-4.8 MCG/ACT AERO Inhale 2 puffs into the lungs in the morning and at bedtime. 1 each 11   busPIRone  (BUSPAR ) 5 MG tablet TAKE 1 TABLET BY MOUTH TWICE A DAY 180 tablet 1   carbidopa -levodopa  (SINEMET  IR) 25-100 MG tablet Take 2 tablets by mouth 3 (three) times daily.     carbidopa -levodopa  (SINEMET  IR) 25-100 MG tablet Take 2 tablets by mouth 2 (two) times daily.     cyanocobalamin  (VITAMIN B12) 100 MCG tablet Take 100 mcg by mouth daily.     diclofenac  Sodium (VOLTAREN  ARTHRITIS PAIN) 1 % GEL Apply 4 g topically 4 (four) times daily. 100 g 0   EPINEPHrine  0.3 mg/0.3 mL IJ SOAJ injection Inject 0.3 mg into the muscle as needed for  anaphylaxis. 1 each 0   fluticasone  (FLONASE ) 50 MCG/ACT nasal spray SPRAY 2 SPRAYS INTO EACH NOSTRIL EVERY DAY 48 mL 2   fluticasone  (FLONASE ) 50 MCG/ACT nasal spray Place 2 sprays into both nostrils daily. 16 g 10   folic acid  (FOLVITE ) 1 MG tablet TAKE 1 TABLET BY MOUTH EVERY DAY 90 tablet 1   hydrocortisone  (ANUSOL -HC) 25 MG suppository Place 1 suppository (25 mg total) rectally 2 (two) times daily. 12 suppository 0   hydrOXYzine  (VISTARIL ) 50 MG capsule TAKE 1 CAPSULE BY MOUTH 3 TIMES DAILY AS NEEDED. 270 capsule 1   ibuprofen  (ADVIL ) 400 MG tablet Take 1 tablet (400 mg total) by mouth every 8 (eight) hours as needed for headache or moderate pain. 20 tablet 0   KLOR-CON  M20 20 MEQ tablet TAKE 1 TABLET BY  MOUTH EVERY DAY 90 tablet 1   lamoTRIgine (LAMICTAL) 25 MG tablet Take 25 mg by mouth.     lamoTRIgine (LAMICTAL) 25 MG tablet Take 50 mg by mouth 2 (two) times daily.     LORazepam  (ATIVAN ) 0.5 MG tablet Take 1 tablet (0.5 mg total) by mouth at bedtime as needed for anxiety. 30 tablet 0   meloxicam  (MOBIC ) 15 MG tablet TAKE 1 TABLET EVERY DAY BY ORAL ROUTE WITH MEAL(S).     NUCALA 100 MG/ML SOAJ      ondansetron  (ZOFRAN ) 4 MG tablet Take 1 tablet (4 mg total) by mouth every 8 (eight) hours as needed for nausea or vomiting. 20 tablet 0   OXYGEN  Inhale 3 L/min into the lungs See admin instructions. 3 L/min at bedtime and as needed for shortness of breath throughout the day     pantoprazole  (PROTONIX ) 20 MG tablet Take by mouth.     promethazine  (PHENERGAN ) 25 MG tablet Take 1 tablet (25 mg total) by mouth every 6 (six) hours as needed. 30 tablet 0   sertraline  (ZOLOFT ) 100 MG tablet Take two tablets once daily for total 200 mg daily 180 tablet 3   tiZANidine  (ZANAFLEX ) 4 MG tablet Take 1 tablet (4 mg total) by mouth every 8 (eight) hours as needed for muscle spasms. 30 tablet 0   topiramate  (TOPAMAX ) 50 MG tablet TAKE 1 TABLET BY MOUTH EVERYDAY AT BEDTIME 90 tablet 1   traMADol  (ULTRAM ) 50 MG tablet Take 1 tablet (50 mg total) by mouth every 6 (six) hours as needed. 15 tablet 0   Vitamin D , Ergocalciferol , (DRISDOL) 1.25 MG (50000 UNIT) CAPS capsule Take 50,000 Units by mouth once a week.     No facility-administered medications prior to visit.     Per HPI unless specifically indicated in ROS section below Review of Systems  Constitutional:  Negative for fatigue and fever.  HENT:  Negative for congestion.   Eyes:  Negative for pain.  Respiratory:  Negative for cough and shortness of breath.   Cardiovascular:  Negative for chest pain, palpitations and leg swelling.  Gastrointestinal:  Negative for abdominal pain.  Genitourinary:  Negative for dysuria and vaginal bleeding.  Musculoskeletal:   Positive for neck pain. Negative for back pain.  Skin:  Positive for rash.  Neurological:  Negative for syncope, light-headedness and headaches.  Psychiatric/Behavioral:  Negative for dysphoric mood.    Objective:  BP 122/88   Pulse 90   Temp 98.2 F (36.8 C) (Oral)   Ht 6' 1 (1.854 m)   Wt 192 lb (87.1 kg) Comment: with boot on L foot.  SpO2 96%   BMI 25.33 kg/m  Wt Readings from Last 3 Encounters:  08/12/24 192 lb (87.1 kg)  07/31/24 189 lb (85.7 kg)  07/29/24 188 lb 15 oz (85.7 kg)      Physical Exam Constitutional:      General: She is not in acute distress.    Appearance: Normal appearance. She is well-developed. She is not ill-appearing or toxic-appearing.  HENT:     Head: Normocephalic.     Right Ear: Hearing, tympanic membrane, ear canal and external ear normal. Tympanic membrane is not erythematous, retracted or bulging.     Left Ear: Hearing, tympanic membrane, ear canal and external ear normal. Tympanic membrane is not erythematous, retracted or bulging.     Nose: No mucosal edema or rhinorrhea.     Right Sinus: No maxillary sinus tenderness or frontal sinus tenderness.     Left Sinus: No maxillary sinus tenderness or frontal sinus tenderness.     Mouth/Throat:     Pharynx: Uvula midline.  Eyes:     General: Lids are normal. Lids are everted, no foreign bodies appreciated.     Conjunctiva/sclera: Conjunctivae normal.     Pupils: Pupils are equal, round, and reactive to light.  Neck:     Thyroid : No thyroid  mass or thyromegaly.     Vascular: No carotid bruit.     Trachea: Trachea normal.  Cardiovascular:     Rate and Rhythm: Normal rate and regular rhythm.     Pulses: Normal pulses.     Heart sounds: Normal heart sounds, S1 normal and S2 normal. No murmur heard.    No friction rub. No gallop.  Pulmonary:     Effort: Pulmonary effort is normal. No tachypnea or respiratory distress.     Breath sounds: Normal breath sounds. No decreased breath sounds,  wheezing, rhonchi or rales.  Abdominal:     General: Bowel sounds are normal.     Palpations: Abdomen is soft.     Tenderness: There is no abdominal tenderness.  Musculoskeletal:     Cervical back: Neck supple. Tenderness present. No spasms or bony tenderness. Pain with movement present. Decreased range of motion.  Skin:    General: Skin is warm and dry.     Findings: No rash.     Comments: Skin very tender to touch  Dermatomal skin rash, scabbed over, no blisters .SABRA See photo  Neurological:     Mental Status: She is alert.  Psychiatric:        Mood and Affect: Mood is not anxious or depressed.        Speech: Speech normal.        Behavior: Behavior normal. Behavior is cooperative.        Thought Content: Thought content normal.        Judgment: Judgment normal.       Results for orders placed or performed during the hospital encounter of 07/18/24  POCT URINE DIPSTICK   Collection Time: 07/18/24  9:58 AM  Result Value Ref Range   Color, UA yellow yellow   Clarity, UA cloudy (A) clear   Glucose, UA negative negative mg/dL   Bilirubin, UA negative negative   Ketones, POC UA negative negative mg/dL   Spec Grav, UA 8.979 8.989 - 1.025   Blood, UA trace-intact (A) negative   pH, UA 5.5 5.0 - 8.0   POC PROTEIN,UA negative negative, trace   Urobilinogen, UA 0.2 0.2 or 1.0 E.U./dL   Nitrite, UA Positive (A) Negative   Leukocytes, UA Small (1+) (A) Negative  Urine Culture   Collection Time: 07/18/24 10:55 AM   Specimen: Urine, Clean Catch  Result Value Ref Range   Specimen Description URINE, CLEAN CATCH    Special Requests      NONE Performed at [facility], [address], [place], [place] [address]    Culture >=100,000 COLONIES/mL KLEBSIELLA AEROGENES (A)    Report Status 07/20/2024 FINAL    Organism ID, Bacteria KLEBSIELLA AEROGENES (A)       Susceptibility   Klebsiella aerogenes - MIC*    CEFEPIME  <=0.12 SENSITIVE Sensitive     CEFTRIAXONE  <=0.25 SENSITIVE  Sensitive     CIPROFLOXACIN  <=0.25 SENSITIVE Sensitive     GENTAMICIN <=1 SENSITIVE Sensitive     IMIPENEM 2 SENSITIVE Sensitive     NITROFURANTOIN  64 INTERMEDIATE Intermediate     TRIMETH /SULFA  <=20 SENSITIVE Sensitive     PIP/TAZO <=4 SENSITIVE Sensitive ug/mL    * >=100,000 COLONIES/mL KLEBSIELLA AEROGENES    Assessment and Plan  Herpes zoster without complication Assessment & Plan: Acute, now completed 2 weeks of rash, no clear new vesicles appearing but pain very poorly controlled.  Concern for postherpetic neuralgia.  Encouraged her to use Tylenol  Extra Strength 2 tablets 3 times a day but not more frequently.  Continue to control nausea with Phenergan . Will start Lyrica  25 mg p.o. twice daily and if she tolerates this she can increase the dose to 3 times daily for better pain control.  She will call if her pain is not controlled.  Return and ER precautions reviewed in detail with patient.  I will also start prednisone  to help with inflammation and pain more from the possible cervical disc disease Association.   Cervical disc disease Assessment & Plan: Acute, possible irritation of the nerve roots at her neck given she does note some numbness in her neck and pain from neck down to left arm. Will treat with prednisone  40 mg x 5 days then 20 mg x 5 days. Hold nonsteroidal anti-inflammatories while on this medication.   Other orders -     predniSONE ; 2 tabs by mouth daily x 5 days, then 1 tabs by mouth daily x 5 days  Dispense: 15 tablet; Refill: 0 -     Pregabalin ; Take 1 capsule (25 mg total) by mouth 2 (two) times daily as needed (shingles pain).  Dispense: 30 capsule; Refill: 0    No follow-ups on file.   Greig Ring, MD

## 2024-08-12 NOTE — Telephone Encounter (Signed)
 NOTED

## 2024-08-13 ENCOUNTER — Telehealth: Payer: Self-pay

## 2024-08-13 NOTE — Telephone Encounter (Signed)
 Pharmacy Patient Advocate Encounter  Received notification from [facility] that Prior Authorization for Pregabalin  25MG  capsules has been APPROVED from 03/17/2024 to 12/16/24   PA #/Case ID/Reference #: [id number]

## 2024-08-21 DIAGNOSIS — S90112A Contusion of left great toe without damage to nail, initial encounter: Secondary | ICD-10-CM | POA: Diagnosis not present

## 2024-08-25 DIAGNOSIS — J449 Chronic obstructive pulmonary disease, unspecified: Secondary | ICD-10-CM | POA: Diagnosis not present

## 2024-08-25 DIAGNOSIS — I509 Heart failure, unspecified: Secondary | ICD-10-CM | POA: Diagnosis not present

## 2024-08-26 ENCOUNTER — Other Ambulatory Visit: Payer: Self-pay | Admitting: Family

## 2024-08-26 DIAGNOSIS — G43419 Hemiplegic migraine, intractable, without status migrainosus: Secondary | ICD-10-CM

## 2024-08-26 NOTE — Telephone Encounter (Signed)
 Can we call and ask pt if she is still taking topiramate ?  From previous notes from neurology it looks like she should no longer be on this.  Maybe getting from headache clinic?

## 2024-08-27 ENCOUNTER — Other Ambulatory Visit: Payer: Self-pay | Admitting: Family

## 2024-08-27 DIAGNOSIS — J449 Chronic obstructive pulmonary disease, unspecified: Secondary | ICD-10-CM | POA: Diagnosis not present

## 2024-08-27 DIAGNOSIS — I509 Heart failure, unspecified: Secondary | ICD-10-CM | POA: Diagnosis not present

## 2024-08-28 ENCOUNTER — Other Ambulatory Visit: Payer: Self-pay | Admitting: Family Medicine

## 2024-09-02 ENCOUNTER — Encounter: Payer: Self-pay | Admitting: Family Medicine

## 2024-09-02 ENCOUNTER — Ambulatory Visit (INDEPENDENT_AMBULATORY_CARE_PROVIDER_SITE_OTHER): Admitting: Family Medicine

## 2024-09-02 VITALS — BP 124/74 | HR 73 | Temp 98.3°F | Ht 73.0 in | Wt 187.8 lb

## 2024-09-02 DIAGNOSIS — N390 Urinary tract infection, site not specified: Secondary | ICD-10-CM | POA: Diagnosis not present

## 2024-09-02 DIAGNOSIS — B0229 Other postherpetic nervous system involvement: Secondary | ICD-10-CM | POA: Diagnosis not present

## 2024-09-02 LAB — POC URINALSYSI DIPSTICK (AUTOMATED)
Glucose, UA: NEGATIVE
Ketones, UA: NEGATIVE
Nitrite, UA: POSITIVE
Protein, UA: POSITIVE — AB
Spec Grav, UA: 1.02 (ref 1.010–1.025)
Urobilinogen, UA: NEGATIVE U/dL — AB
pH, UA: 6 (ref 5.0–8.0)

## 2024-09-02 MED ORDER — CEPHALEXIN 500 MG PO CAPS: 500.0000 mg | ORAL_CAPSULE | Freq: Three times a day (TID) | ORAL | 0 refills | Status: DC

## 2024-09-02 MED ORDER — PREGABALIN 50 MG PO CAPS: 50.0000 mg | ORAL_CAPSULE | Freq: Three times a day (TID) | ORAL | 1 refills | Status: DC

## 2024-09-02 NOTE — Addendum Note (Signed)
 Addended by: HOPE VEVA PARAS on: 09/02/2024 11:12 AM   Modules accepted: Orders

## 2024-09-02 NOTE — Assessment & Plan Note (Signed)
 Acute, patient with recent Klebsiella UTI in August.  Symptoms resolved completely.  Now with new symptoms.  Urinalysis concerning for acute cystitis.  Will send urine for culture to verify and identify bacteria. Encouraged patient to push fluids and complete a course of cephalexin  500 mg p.o. 3 times daily x 7 days.  Return and ER precautions provided.

## 2024-09-02 NOTE — Assessment & Plan Note (Signed)
 Acute, resolved shingles rash with no associated infection. Patient states Lyrica  minimally helpful at 25 mg twice daily but no side effects to this medication. Will have her increase to Lyrica  50 mg p.o. twice daily and she can increase to 3 times daily for pain control if no sedation side effects.

## 2024-09-02 NOTE — Progress Notes (Signed)
 Patient ID: Morgan [name], female    DOB: 1951-03-07, 73 y.o.   MRN: [id number]  This visit was conducted in person.  BP 124/74   Pulse 73   Temp 98.3 F (36.8 C) (Oral)   Ht 6' 1 (1.854 m)   Wt 187 lb 12.8 oz (85.2 kg)   SpO2 96%   BMI 24.78 kg/m    CC:  Chief Complaint  Patient presents with   Urinary Tract Infection    Ongoing for 4 days, odor, has not noticed any blood. No increase in frequency but she feels like she has to go. When she does go its cloudy    Subjective:   HPI: Morgan [name] is a 73 y.o. female presenting on 09/02/2024 for Urinary Tract Infection (Ongoing for 4 days, odor, has not noticed any blood. No increase in frequency but she feels like she has to go. When she does go its cloudy)  Urinary Tract Infection  This is a new problem. The current episode started in the past 7 days. The problem occurs every urination. The problem has been gradually worsening. The quality of the pain is described as burning. The patient is experiencing no pain. There has been no fever. She is Not sexually active. Associated symptoms include hesitancy and nausea. Pertinent negatives include no chills, discharge, flank pain, frequency, hematuria, sweats, urgency or vomiting. Associated symptoms comments: Cloud urine. She has tried increased fluids for the symptoms. The treatment provided no relief. Her past medical history is significant for recurrent UTIs. There is no history of catheterization, kidney stones, a single kidney, urinary stasis or a urological procedure.     07/18/2024 Urine Culture: Klebsiella:  resistant to nitrofurantoin .  Treated with Bactrim  07/2023 Ecoli     Pain from shingles continue... still with constant pain and burning in left chest where Zoster.  Relevant past medical, surgical, family and social history reviewed and updated as indicated. Interim medical history since our last visit reviewed. Allergies and medications reviewed and updated. Outpatient  Medications Prior to Visit  Medication Sig Dispense Refill   acetaminophen  (TYLENOL ) 325 MG tablet Take 2 tablets (650 mg total) by mouth every 4 (four) hours as needed for mild pain (or temp > 37.5 C (99.5 F)).     albuterol  (VENTOLIN  HFA) 108 (90 Base) MCG/ACT inhaler Inhale 2 puffs into the lungs every 6 (six) hours as needed for wheezing or shortness of breath. 1 each 11   Budeson-Glycopyrrol-Formoterol  (BREZTRI  AEROSPHERE) 160-9-4.8 MCG/ACT AERO Inhale 2 puffs into the lungs in the morning and at bedtime. 1 each 11   busPIRone  (BUSPAR ) 5 MG tablet TAKE 1 TABLET BY MOUTH TWICE A DAY 180 tablet 1   carbidopa -levodopa  (SINEMET  IR) 25-100 MG tablet Take 2 tablets by mouth 2 (two) times daily.     cyanocobalamin  (VITAMIN B12) 100 MCG tablet Take 100 mcg by mouth daily.     EPINEPHrine  0.3 mg/0.3 mL IJ SOAJ injection Inject 0.3 mg into the muscle as needed for anaphylaxis. 1 each 0   fluticasone  (FLONASE ) 50 MCG/ACT nasal spray SPRAY 2 SPRAYS INTO EACH NOSTRIL EVERY DAY 48 mL 2   folic acid  (FOLVITE ) 1 MG tablet TAKE 1 TABLET BY MOUTH EVERY DAY 90 tablet 1   hydrOXYzine  (VISTARIL ) 50 MG capsule TAKE 1 CAPSULE BY MOUTH 3 TIMES DAILY AS NEEDED. 270 capsule 1   ibuprofen  (ADVIL ) 400 MG tablet Take 1 tablet (400 mg total) by mouth every 8 (eight) hours as needed for headache  or moderate pain. 20 tablet 0   lamoTRIgine (LAMICTAL) 25 MG tablet Take 50 mg by mouth 2 (two) times daily.     LORazepam  (ATIVAN ) 0.5 MG tablet Take 1 tablet (0.5 mg total) by mouth at bedtime as needed for anxiety. 30 tablet 0   NUCALA 100 MG/ML SOAJ      ondansetron  (ZOFRAN ) 4 MG tablet Take 1 tablet (4 mg total) by mouth every 8 (eight) hours as needed for nausea or vomiting. 20 tablet 0   OXYGEN  Inhale 3 L/min into the lungs See admin instructions. 3 L/min at bedtime and as needed for shortness of breath throughout the day     potassium chloride  SA (KLOR-CON  M) 20 MEQ tablet TAKE 1 TABLET BY MOUTH EVERY DAY 90 tablet 1    predniSONE  (DELTASONE ) 20 MG tablet 2 tabs by mouth daily x 5 days, then 1 tabs by mouth daily x 5 days 15 tablet 0   promethazine  (PHENERGAN ) 25 MG tablet Take 1 tablet (25 mg total) by mouth every 6 (six) hours as needed. 30 tablet 0   sertraline  (ZOLOFT ) 100 MG tablet Take two tablets once daily for total 200 mg daily 180 tablet 3   tiZANidine  (ZANAFLEX ) 4 MG tablet Take 1 tablet (4 mg total) by mouth every 8 (eight) hours as needed for muscle spasms. 30 tablet 0   topiramate  (TOPAMAX ) 50 MG tablet TAKE 1 TABLET BY MOUTH EVERYDAY AT BEDTIME 90 tablet 1   Vitamin D , Ergocalciferol , (DRISDOL) 1.25 MG (50000 UNIT) CAPS capsule Take 50,000 Units by mouth once a week.     carbidopa -levodopa  (SINEMET  IR) 25-100 MG tablet Take 2 tablets by mouth 3 (three) times daily.     diclofenac  Sodium (VOLTAREN  ARTHRITIS PAIN) 1 % GEL Apply 4 g topically 4 (four) times daily. 100 g 0   fluticasone  (FLONASE ) 50 MCG/ACT nasal spray Place 2 sprays into both nostrils daily. 16 g 10   hydrocortisone  (ANUSOL -HC) 25 MG suppository Place 1 suppository (25 mg total) rectally 2 (two) times daily. 12 suppository 0   lamoTRIgine (LAMICTAL) 25 MG tablet Take 25 mg by mouth.     meloxicam  (MOBIC ) 15 MG tablet TAKE 1 TABLET EVERY DAY BY ORAL ROUTE WITH MEAL(S).     pantoprazole  (PROTONIX ) 20 MG tablet Take by mouth.     pregabalin  (LYRICA ) 25 MG capsule Take 1 capsule (25 mg total) by mouth 2 (two) times daily as needed (shingles pain). 30 capsule 0   traMADol  (ULTRAM ) 50 MG tablet Take 1 tablet (50 mg total) by mouth every 6 (six) hours as needed. 15 tablet 0   No facility-administered medications prior to visit.     Per HPI unless specifically indicated in ROS section below Review of Systems  Constitutional:  Negative for chills, fatigue and fever.  HENT:  Negative for congestion.   Eyes:  Negative for pain.  Respiratory:  Negative for cough and shortness of breath.   Cardiovascular:  Negative for chest pain,  palpitations and leg swelling.  Gastrointestinal:  Positive for abdominal pain and nausea. Negative for vomiting.  Genitourinary:  Positive for hesitancy. Negative for dysuria, flank pain, frequency, hematuria, urgency and vaginal bleeding.  Musculoskeletal:  Negative for back pain.  Neurological:  Negative for syncope, light-headedness and headaches.  Psychiatric/Behavioral:  Negative for dysphoric mood.    Objective:  BP 124/74   Pulse 73   Temp 98.3 F (36.8 C) (Oral)   Ht 6' 1 (1.854 m)   Wt 187 lb 12.8 oz (  85.2 kg)   SpO2 96%   BMI 24.78 kg/m   Wt Readings from Last 3 Encounters:  09/02/24 187 lb 12.8 oz (85.2 kg)  08/12/24 192 lb (87.1 kg)  07/31/24 189 lb (85.7 kg)      Physical Exam Constitutional:      General: She is not in acute distress.    Appearance: Normal appearance. She is well-developed. She is not ill-appearing or toxic-appearing.  HENT:     Head: Normocephalic.     Right Ear: Hearing, tympanic membrane, ear canal and external ear normal. Tympanic membrane is not erythematous, retracted or bulging.     Left Ear: Hearing, tympanic membrane, ear canal and external ear normal. Tympanic membrane is not erythematous, retracted or bulging.     Nose: No mucosal edema or rhinorrhea.     Right Sinus: No maxillary sinus tenderness or frontal sinus tenderness.     Left Sinus: No maxillary sinus tenderness or frontal sinus tenderness.     Mouth/Throat:     Pharynx: Uvula midline.  Eyes:     General: Lids are normal. Lids are everted, no foreign bodies appreciated.     Conjunctiva/sclera: Conjunctivae normal.     Pupils: Pupils are equal, round, and reactive to light.  Neck:     Thyroid : No thyroid  mass or thyromegaly.     Vascular: No carotid bruit.     Trachea: Trachea normal.  Cardiovascular:     Rate and Rhythm: Normal rate and regular rhythm.     Pulses: Normal pulses.     Heart sounds: Normal heart sounds, S1 normal and S2 normal. No murmur heard.    No  friction rub. No gallop.  Pulmonary:     Effort: Pulmonary effort is normal. No tachypnea or respiratory distress.     Breath sounds: Normal breath sounds. No decreased breath sounds, wheezing, rhonchi or rales.  Abdominal:     General: Bowel sounds are normal.     Palpations: Abdomen is soft.     Tenderness: There is abdominal tenderness in the suprapubic area. There is no right CVA tenderness or left CVA tenderness.     Hernia: No hernia is present.  Musculoskeletal:     Cervical back: Normal range of motion and neck supple.  Skin:    General: Skin is warm and dry.     Findings: Rash present.         Comments: Healed herpes zoster rash, only skin hyperpigmentation remaining.  Neurological:     Mental Status: She is alert.  Psychiatric:        Mood and Affect: Mood is not anxious or depressed.        Speech: Speech normal.        Behavior: Behavior normal. Behavior is cooperative.        Thought Content: Thought content normal.        Judgment: Judgment normal.       Results for orders placed or performed in visit on 09/02/24  POCT Urinalysis Dipstick (Automated)   Collection Time: 09/02/24  9:37 AM  Result Value Ref Range   Color, UA Amber    Clarity, UA CLOUDY    Glucose, UA Negative Negative   Bilirubin, UA 1+    Ketones, UA NEGATIVE    Spec Grav, UA 1.020 1.010 - 1.025   Blood, UA 1+    pH, UA 6.0 5.0 - 8.0   Protein, UA Positive (A) Negative   Urobilinogen, UA negative (A) 0.2 or 1.0  E.U./dL   Nitrite, UA POSITIVE    Leukocytes, UA Moderate (2+) (A) Negative    Assessment and Plan  Urinary tract infection without hematuria, site unspecified Assessment & Plan: Acute, patient with recent Klebsiella UTI in August.  Symptoms resolved completely.  Now with new symptoms.  Urinalysis concerning for acute cystitis.  Will send urine for culture to verify and identify bacteria. Encouraged patient to push fluids and complete a course of cephalexin  500 mg p.o. 3 times  daily x 7 days.  Return and ER precautions provided.  Orders: -     POCT Urinalysis Dipstick (Automated)  Post herpetic neuralgia Assessment & Plan: Acute, resolved shingles rash with no associated infection. Patient states Lyrica  minimally helpful at 25 mg twice daily but no side effects to this medication. Will have her increase to Lyrica  50 mg p.o. twice daily and she can increase to 3 times daily for pain control if no sedation side effects.     Other orders -     Pregabalin ; Take 1 capsule (50 mg total) by mouth 3 (three) times daily.  Dispense: 90 capsule; Refill: 1 -     Cephalexin ; Take 1 capsule (500 mg total) by mouth 3 (three) times daily.  Dispense: 21 capsule; Refill: 0    No follow-ups on file.   Greig Ring, MD

## 2024-09-05 LAB — URINE CULTURE
MICRO NUMBER:: 16980199
SPECIMEN QUALITY:: ADEQUATE

## 2024-09-07 ENCOUNTER — Ambulatory Visit: Payer: Self-pay | Admitting: Family Medicine

## 2024-09-22 ENCOUNTER — Ambulatory Visit (INDEPENDENT_AMBULATORY_CARE_PROVIDER_SITE_OTHER): Admitting: Otolaryngology

## 2024-09-24 ENCOUNTER — Ambulatory Visit (INDEPENDENT_AMBULATORY_CARE_PROVIDER_SITE_OTHER): Admitting: Otolaryngology

## 2024-09-24 ENCOUNTER — Ambulatory Visit (INDEPENDENT_AMBULATORY_CARE_PROVIDER_SITE_OTHER): Admitting: Audiology

## 2024-09-24 DIAGNOSIS — J449 Chronic obstructive pulmonary disease, unspecified: Secondary | ICD-10-CM | POA: Diagnosis not present

## 2024-09-24 DIAGNOSIS — I509 Heart failure, unspecified: Secondary | ICD-10-CM | POA: Diagnosis not present

## 2024-09-26 DIAGNOSIS — I509 Heart failure, unspecified: Secondary | ICD-10-CM | POA: Diagnosis not present

## 2024-09-29 ENCOUNTER — Ambulatory Visit
Admission: RE | Admit: 2024-09-29 | Discharge: 2024-09-29 | Disposition: A | Discharge status: Home / Self Care | Source: Ambulatory Visit | Attending: Family | Admitting: Family

## 2024-09-29 ENCOUNTER — Encounter: Payer: Self-pay | Admitting: Family

## 2024-09-29 ENCOUNTER — Ambulatory Visit (INDEPENDENT_AMBULATORY_CARE_PROVIDER_SITE_OTHER): Admitting: Family

## 2024-09-29 ENCOUNTER — Ambulatory Visit: Payer: Self-pay

## 2024-09-29 ENCOUNTER — Ambulatory Visit: Payer: Self-pay | Admitting: Family

## 2024-09-29 VITALS — BP 162/104 | HR 84 | Temp 98.0°F | Ht 73.0 in | Wt 184.2 lb

## 2024-09-29 DIAGNOSIS — R1085 Abdominal pain of multiple sites: Secondary | ICD-10-CM

## 2024-09-29 DIAGNOSIS — K838 Other specified diseases of biliary tract: Secondary | ICD-10-CM | POA: Diagnosis not present

## 2024-09-29 DIAGNOSIS — R935 Abnormal findings on diagnostic imaging of other abdominal regions, including retroperitoneum: Secondary | ICD-10-CM | POA: Diagnosis not present

## 2024-09-29 DIAGNOSIS — R197 Diarrhea, unspecified: Secondary | ICD-10-CM

## 2024-09-29 DIAGNOSIS — R34 Anuria and oliguria: Secondary | ICD-10-CM | POA: Insufficient documentation

## 2024-09-29 DIAGNOSIS — R110 Nausea: Secondary | ICD-10-CM

## 2024-09-29 DIAGNOSIS — K5730 Diverticulosis of large intestine without perforation or abscess without bleeding: Secondary | ICD-10-CM | POA: Diagnosis not present

## 2024-09-29 DIAGNOSIS — N3090 Cystitis, unspecified without hematuria: Secondary | ICD-10-CM | POA: Diagnosis not present

## 2024-09-29 DIAGNOSIS — R1030 Lower abdominal pain, unspecified: Secondary | ICD-10-CM

## 2024-09-29 DIAGNOSIS — N390 Urinary tract infection, site not specified: Secondary | ICD-10-CM

## 2024-09-29 LAB — CBC WITH DIFFERENTIAL/PLATELET
Basophils Absolute: 0 K/uL (ref 0.0–0.1)
Basophils Relative: 0.5 % (ref 0.0–3.0)
Eosinophils Absolute: 0.2 K/uL (ref 0.0–0.7)
Eosinophils Relative: 2.2 % (ref 0.0–5.0)
HCT: 38.3 % (ref 36.0–46.0)
Hemoglobin: 12.4 g/dL (ref 12.0–15.0)
Lymphocytes Relative: 29.7 % (ref 12.0–46.0)
Lymphs Abs: 2.4 K/uL (ref 0.7–4.0)
MCHC: 32.3 g/dL (ref 30.0–36.0)
MCV: 82.3 fl (ref 78.0–100.0)
Monocytes Absolute: 0.4 K/uL (ref 0.1–1.0)
Monocytes Relative: 5.1 % (ref 3.0–12.0)
Neutro Abs: 5 K/uL (ref 1.4–7.7)
Neutrophils Relative %: 62.5 % (ref 43.0–77.0)
Platelets: 256 K/uL (ref 150.0–400.0)
RBC: 4.66 Mil/uL (ref 3.87–5.11)
RDW: 14.5 % (ref 11.5–15.5)
WBC: 8 K/uL (ref 4.0–10.5)

## 2024-09-29 LAB — COMPREHENSIVE METABOLIC PANEL WITH GFR
ALT: 4 U/L (ref 0–35)
AST: 12 U/L (ref 0–37)
Albumin: 4.2 g/dL (ref 3.5–5.2)
Alkaline Phosphatase: 101 U/L (ref 39–117)
BUN: 27 mg/dL — ABNORMAL HIGH (ref 6–23)
CO2: 28 meq/L (ref 19–32)
Calcium: 9 mg/dL (ref 8.4–10.5)
Chloride: 105 meq/L (ref 96–112)
Creatinine, Ser: 0.94 mg/dL (ref 0.40–1.20)
GFR: 61.15 mL/min (ref >60.00)
Glucose, Bld: 105 mg/dL — ABNORMAL HIGH (ref 70–99)
Potassium: 3.6 meq/L (ref 3.5–5.1)
Sodium: 140 meq/L (ref 135–145)
Total Bilirubin: 0.3 mg/dL (ref 0.2–1.2)
Total Protein: 6.9 g/dL (ref 6.0–8.3)

## 2024-09-29 LAB — AMYLASE: Amylase: 45 U/L (ref 27–131)

## 2024-09-29 LAB — URINALYSIS, ROUTINE W REFLEX MICROSCOPIC
Bilirubin Urine: NEGATIVE
Nitrite: POSITIVE — AB
Specific Gravity, Urine: 1.025 (ref 1.000–1.030)
Total Protein, Urine: 100 — AB
Urine Glucose: NEGATIVE
Urobilinogen, UA: 0.2 (ref 0.0–1.0)
pH: 7.5 (ref 5.0–8.0)

## 2024-09-29 LAB — LIPASE: Lipase: 11 U/L (ref 11.0–59.0)

## 2024-09-29 MED ORDER — IOHEXOL 300 MG/ML  SOLN
100.0000 mL | Freq: Once | INTRAMUSCULAR | Status: AC | PRN
  Administered 2024-09-29: 100 mL via INTRAVENOUS

## 2024-09-29 MED ORDER — CIPROFLOXACIN HCL 500 MG PO TABS: 500.0000 mg | ORAL_TABLET | Freq: Two times a day (BID) | ORAL | 0 refills | Status: AC

## 2024-09-29 MED ORDER — PROMETHAZINE HCL 25 MG PO TABS: 25.0000 mg | ORAL_TABLET | Freq: Four times a day (QID) | ORAL | 0 refills | Status: AC | PRN

## 2024-09-29 NOTE — Telephone Encounter (Signed)
 Attempted to call pt again, there was no answer. I left another message.

## 2024-09-29 NOTE — Telephone Encounter (Signed)
 Copied from CRM [id number]. Topic: General - Other >> Sep 29, 2024  8:43 AM [place] A wrote: Reason for CRM: Please call patient back

## 2024-09-29 NOTE — Telephone Encounter (Signed)
 LM for pt to return call.

## 2024-09-29 NOTE — Progress Notes (Signed)
 Established Patient Office Visit  Subjective:      CC:  Chief Complaint  Patient presents with   Acute Visit    Reports abdominal pain, headache, feeling shaky, chills and vomiting x1 day.    HPI: Morgan [name] is a 73 y.o. female presenting on 09/29/2024 for Acute Visit (Reports abdominal pain, headache, feeling shaky, chills and vomiting x1 day.) .  Discussed the use of AI scribe software for clinical note transcription with the patient, who gave verbal consent to proceed.  History of Present Illness Morgan [name] is a 73 year old female who presents with abdominal pain and vomiting.  She began experiencing abdominal pain and vomiting yesterday afternoon. The vomitus is greenish-black and slimy. She attempted to drink Coke but vomited again, totaling four episodes of vomiting since this morning. The abdominal pain is sharp and stabbing, rated 8 to 9 out of 10 in severity, and is constant across the lower abdomen. No back pain is reported, but there is increased gassiness.  She has a history of diarrhea, which she states is normal for her and has not increased. No fever, sinus pressure, congestion, or cough. She reports difficulty urinating, with very little output since this morning.  She recalls a similar episode of severe abdominal pain last year or the year before, which was not resolved. Her gallbladder was removed two years ago. Her brother recently had kidney stones, and she wonders if her symptoms could be related to a kidney stone.  She is currently out of her nausea medication, Phenergan , which she finds effective, as Zofran  does not work for her.         Social history:  Relevant past medical, surgical, family and social history reviewed and updated as indicated. Interim medical history since our last visit reviewed.  Allergies and medications reviewed and updated.  DATA REVIEWED: CHART IN EPIC     ROS: Negative unless specifically indicated above in  HPI.    Current Outpatient Medications:    acetaminophen  (TYLENOL ) 325 MG tablet, Take 2 tablets (650 mg total) by mouth every 4 (four) hours as needed for mild pain (or temp > 37.5 C (99.5 F))., Disp: , Rfl:    albuterol  (VENTOLIN  HFA) 108 (90 Base) MCG/ACT inhaler, Inhale 2 puffs into the lungs every 6 (six) hours as needed for wheezing or shortness of breath., Disp: 1 each, Rfl: 11   Budeson-Glycopyrrol-Formoterol  (BREZTRI  AEROSPHERE) 160-9-4.8 MCG/ACT AERO, Inhale 2 puffs into the lungs in the morning and at bedtime., Disp: 1 each, Rfl: 11   busPIRone  (BUSPAR ) 5 MG tablet, TAKE 1 TABLET BY MOUTH TWICE A DAY, Disp: 180 tablet, Rfl: 1   carbidopa -levodopa  (SINEMET  IR) 25-100 MG tablet, Take 2 tablets by mouth 2 (two) times daily., Disp: , Rfl:    cephALEXin  (KEFLEX ) 500 MG capsule, Take 1 capsule (500 mg total) by mouth 3 (three) times daily., Disp: 21 capsule, Rfl: 0   cyanocobalamin  (VITAMIN B12) 100 MCG tablet, Take 100 mcg by mouth daily., Disp: , Rfl:    EPINEPHrine  0.3 mg/0.3 mL IJ SOAJ injection, Inject 0.3 mg into the muscle as needed for anaphylaxis., Disp: 1 each, Rfl: 0   fluticasone  (FLONASE ) 50 MCG/ACT nasal spray, SPRAY 2 SPRAYS INTO EACH NOSTRIL EVERY DAY, Disp: 48 mL, Rfl: 2   folic acid  (FOLVITE ) 1 MG tablet, TAKE 1 TABLET BY MOUTH EVERY DAY, Disp: 90 tablet, Rfl: 1   hydrOXYzine  (VISTARIL ) 50 MG capsule, TAKE 1 CAPSULE BY MOUTH 3 TIMES DAILY AS  NEEDED., Disp: 270 capsule, Rfl: 1   ibuprofen  (ADVIL ) 400 MG tablet, Take 1 tablet (400 mg total) by mouth every 8 (eight) hours as needed for headache or moderate pain., Disp: 20 tablet, Rfl: 0   lamoTRIgine (LAMICTAL) 25 MG tablet, Take 50 mg by mouth 2 (two) times daily., Disp: , Rfl:    LORazepam  (ATIVAN ) 0.5 MG tablet, Take 1 tablet (0.5 mg total) by mouth at bedtime as needed for anxiety., Disp: 30 tablet, Rfl: 0   NUCALA 100 MG/ML SOAJ, , Disp: , Rfl:    ondansetron  (ZOFRAN ) 4 MG tablet, Take 1 tablet (4 mg total) by mouth every  8 (eight) hours as needed for nausea or vomiting., Disp: 20 tablet, Rfl: 0   OXYGEN , Inhale 3 L/min into the lungs See admin instructions. 3 L/min at bedtime and as needed for shortness of breath throughout the day, Disp: , Rfl:    potassium chloride  SA (KLOR-CON  M) 20 MEQ tablet, TAKE 1 TABLET BY MOUTH EVERY DAY, Disp: 90 tablet, Rfl: 1   pregabalin  (LYRICA ) 50 MG capsule, Take 1 capsule (50 mg total) by mouth 3 (three) times daily., Disp: 90 capsule, Rfl: 1   sertraline  (ZOLOFT ) 100 MG tablet, Take two tablets once daily for total 200 mg daily, Disp: 180 tablet, Rfl: 3   tiZANidine  (ZANAFLEX ) 4 MG tablet, Take 1 tablet (4 mg total) by mouth every 8 (eight) hours as needed for muscle spasms., Disp: 30 tablet, Rfl: 0   topiramate  (TOPAMAX ) 50 MG tablet, TAKE 1 TABLET BY MOUTH EVERYDAY AT BEDTIME, Disp: 90 tablet, Rfl: 1   Vitamin D , Ergocalciferol , (DRISDOL) 1.25 MG (50000 UNIT) CAPS capsule, Take 50,000 Units by mouth once a week., Disp: , Rfl:    promethazine  (PHENERGAN ) 25 MG tablet, Take 1 tablet (25 mg total) by mouth every 6 (six) hours as needed., Disp: 30 tablet, Rfl: 0        Objective:        BP (!) 162/104 (BP Location: Left Arm, Patient Position: Sitting, Cuff Size: Large)   Pulse 84   Temp 98 F (36.7 C) (Oral)   Ht 6' 1 (1.854 m)   Wt 184 lb 3.7 oz (83.6 kg)   SpO2 99%   BMI 24.31 kg/m   Physical Exam ABDOMEN: Severe tenderness on light palpation.  Wt Readings from Last 3 Encounters:  09/29/24 184 lb 3.7 oz (83.6 kg)  09/02/24 187 lb 12.8 oz (85.2 kg)  08/12/24 192 lb (87.1 kg)    Physical Exam Constitutional:      General: She is not in acute distress.    Appearance: Normal appearance. She is normal weight. She is not ill-appearing, toxic-appearing or diaphoretic.  HENT:     Head: Normocephalic.  Cardiovascular:     Rate and Rhythm: Normal rate.  Pulmonary:     Effort: Pulmonary effort is normal.  Abdominal:     General: Bowel sounds are increased.      Tenderness: There is abdominal tenderness.     Comments: 10/10 tenderness all quadrants   Musculoskeletal:        General: Normal range of motion.  Neurological:     General: No focal deficit present.     Mental Status: She is alert and oriented to person, place, and time. Mental status is at baseline.  Psychiatric:        Mood and Affect: Mood normal.        Behavior: Behavior normal.        Thought  Content: Thought content normal.        Judgment: Judgment normal.          Results   Assessment & Plan:   Assessment and Plan Assessment & Plan Acute abdominal pain with vomiting and decreased urination Acute onset of sharp, stabbing abdominal pain, rated 8-9/10, diffuse across the lower abdomen. Associated with greenish-black, slimy emesis and decreased urination since this morning. Differential diagnosis includes kidney stones, considering symptoms and family history. No fever. Previous cholecystectomy two years ago. No known allergies to contrast or dye. Discussed potential for kidney stones causing obstruction, risking kidney damage if untreated. Emphasized monitoring for worsening symptoms: increased pain, inability to keep fluids down, fever, hematuria, or gastrointestinal bleeding. -I do recommend ER as pt not voiding, could be possible obstructed kidney stone vs infectious process, pt declines.  - Order stat CT abdomen - Order stat labs including liver function tests, amylase, and lipase, cbc - Prescribe Phenergan  for nausea, advised sedation side effects - Advise to go to the ER if symptoms worsen, such as increased pain, inability to urinate, or signs of gastrointestinal bleeding  Chronic diarrhea Chronic diarrhea is reported as normal for her, with no increase in frequency or change in consistency. No constipation.       Return in about 2 weeks (around 10/13/2024) for f/u abdominal pain .     Ginger Patrick, MSN, APRN, FNP-C [facility]

## 2024-09-29 NOTE — Telephone Encounter (Signed)
 FYI Only or Action Required?: FYI only for provider.  Patient was last seen in primary care on 09/02/2024 by Avelina Greig BRAVO, MD.  Called Nurse Triage reporting Abdominal Pain.  Symptoms began yesterday.  Interventions attempted: Nothing.  Symptoms are: unchanged.  Triage Disposition: Go to ED Now (Notify PCP)  Patient/caregiver understands and will follow disposition?: No  **Refused ED; see note below; CAL Notified**         Copied from CRM #[id number]. Topic: Clinical - Red Word Triage >> Sep 29, 2024  8:15 AM Eva FALCON wrote: Red Word that prompted transfer to Nurse Triage: Severe stomach pain since yesterday. Got dizzy last night/ states bp was hight but unable to remember the numbers. Was nauseous this morning and threw up greenish/black stuff. Reason for Disposition  [1] SEVERE pain AND [2] age > 60 years  Answer Assessment - Initial Assessment Questions 1. LOCATION: Where does it hurt?       Mid abdomen  2. RADIATION: Does the pain shoot anywhere else? (e.g., chest, back)     Chest pain last night, since resolved, burning sensation, lasting an hour  3. ONSET: When did the pain begin? (e.g., minutes, hours or days ago)      X 1 day   4. SUDDEN: Gradual or sudden onset?     Sudden  5. PATTERN Does the pain come and go, or is it constant?     Constant   6. SEVERITY: How bad is the pain?  (e.g., Scale 1-10; mild, moderate, or severe)     Sever  7. RECURRENT SYMPTOM: Have you ever had this type of stomach pain before? If Yes, ask: When was the last time? and What happened that time?      Yes, last year   8. CAUSE: What do you think is causing the stomach pain? (e.g., gallstones, recent abdominal surgery)     Unsure   9. RELIEVING/AGGRAVATING FACTORS: What makes it better or worse? (e.g., antacids, bending or twisting motion, bowel movement)     Nothing  10. OTHER SYMPTOMS: Do you have any other symptoms? (e.g., back pain, diarrhea, fever,  urination pain, vomiting)    Dizziness, lightheadedness mild currently during the call, high B/P she doesn't know the reading, vomiting x 4 this morning, headache.   Referred to the ED for symptoms. Pt. Refusing to go; and states she only wants an appt.  Protocols used: Abdominal Pain - Female-A-AH

## 2024-09-29 NOTE — Telephone Encounter (Signed)
 Spoke with pt and she has been scheduled to see Tabitha today at 1320.

## 2024-09-30 ENCOUNTER — Other Ambulatory Visit

## 2024-09-30 ENCOUNTER — Other Ambulatory Visit: Payer: Self-pay | Admitting: Family

## 2024-09-30 DIAGNOSIS — N3001 Acute cystitis with hematuria: Secondary | ICD-10-CM

## 2024-09-30 MED ORDER — PHENAZOPYRIDINE HCL 100 MG PO TABS: 100.0000 mg | ORAL_TABLET | Freq: Three times a day (TID) | ORAL | 0 refills | Status: DC | PRN

## 2024-10-02 LAB — URINE CULTURE
MICRO NUMBER:: 17096964
SPECIMEN QUALITY:: ADEQUATE

## 2024-10-05 NOTE — Telephone Encounter (Signed)
 Spoke with pt and she states that she is still having pain but it's not any worse; it's about the same.

## 2024-10-05 NOTE — Telephone Encounter (Signed)
 Hey lindsay   Can you give her a call this am  Ask how she is feeling with her stomach? Any better? I want to know if I need to send in more meds.

## 2024-10-06 MED ORDER — CIPROFLOXACIN HCL 500 MG PO TABS: 500.0000 mg | ORAL_TABLET | Freq: Two times a day (BID) | ORAL | 0 refills | Status: DC

## 2024-10-06 NOTE — Telephone Encounter (Signed)
 Let's send in five more days of antibiotics Have her make a follow up Friday if she can

## 2024-10-06 NOTE — Telephone Encounter (Signed)
 LM for pt to return call.

## 2024-10-07 NOTE — Telephone Encounter (Signed)
 Spoke with pt. She is aware of Tabitha's response. She will call if this round of medication doesn't clear things ups.

## 2024-10-09 ENCOUNTER — Ambulatory Visit (INDEPENDENT_AMBULATORY_CARE_PROVIDER_SITE_OTHER): Admitting: Family

## 2024-10-09 ENCOUNTER — Ambulatory Visit: Payer: Self-pay | Admitting: Family

## 2024-10-09 ENCOUNTER — Other Ambulatory Visit: Payer: Self-pay

## 2024-10-09 ENCOUNTER — Ambulatory Visit (INDEPENDENT_AMBULATORY_CARE_PROVIDER_SITE_OTHER)
Admission: RE | Admit: 2024-10-09 | Discharge: 2024-10-09 | Disposition: A | Discharge status: Home / Self Care | Source: Ambulatory Visit | Attending: Family | Admitting: Family

## 2024-10-09 ENCOUNTER — Encounter: Payer: Self-pay | Admitting: Family

## 2024-10-09 VITALS — BP 126/92 | HR 74 | Temp 98.4°F | Ht 73.0 in | Wt 198.0 lb

## 2024-10-09 DIAGNOSIS — N3001 Acute cystitis with hematuria: Secondary | ICD-10-CM

## 2024-10-09 DIAGNOSIS — R0789 Other chest pain: Secondary | ICD-10-CM

## 2024-10-09 DIAGNOSIS — R10A1 Flank pain, right side: Secondary | ICD-10-CM | POA: Diagnosis not present

## 2024-10-09 DIAGNOSIS — N3090 Cystitis, unspecified without hematuria: Secondary | ICD-10-CM

## 2024-10-09 DIAGNOSIS — S299XXA Unspecified injury of thorax, initial encounter: Secondary | ICD-10-CM | POA: Diagnosis not present

## 2024-10-09 DIAGNOSIS — Z0189 Encounter for other specified special examinations: Secondary | ICD-10-CM | POA: Diagnosis not present

## 2024-10-09 LAB — POCT URINALYSIS DIP (CLINITEK)
Bilirubin, UA: NEGATIVE
Glucose, UA: NEGATIVE mg/dL
Ketones, POC UA: NEGATIVE mg/dL
Nitrite, UA: NEGATIVE
POC PROTEIN,UA: NEGATIVE
Spec Grav, UA: 1.025 (ref 1.010–1.025)
Urobilinogen, UA: 0.2 U/dL
pH, UA: 5.5 (ref 5.0–8.0)

## 2024-10-09 LAB — COMPREHENSIVE METABOLIC PANEL WITH GFR
ALT: 8 U/L (ref 0–35)
AST: 16 U/L (ref 0–37)
Albumin: 4.1 g/dL (ref 3.5–5.2)
Alkaline Phosphatase: 88 U/L (ref 39–117)
BUN: 26 mg/dL — ABNORMAL HIGH (ref 6–23)
CO2: 26 meq/L (ref 19–32)
Calcium: 9.3 mg/dL (ref 8.4–10.5)
Chloride: 103 meq/L (ref 96–112)
Creatinine, Ser: 0.95 mg/dL (ref 0.40–1.20)
GFR: 60.37 mL/min (ref >60.00)
Glucose, Bld: 90 mg/dL (ref 70–99)
Potassium: 3.9 meq/L (ref 3.5–5.1)
Sodium: 139 meq/L (ref 135–145)
Total Bilirubin: 0.3 mg/dL (ref 0.2–1.2)
Total Protein: 6.8 g/dL (ref 6.0–8.3)

## 2024-10-09 LAB — CBC WITH DIFFERENTIAL/PLATELET
Basophils Absolute: 0 K/uL (ref 0.0–0.1)
Basophils Relative: 0.7 % (ref 0.0–3.0)
Eosinophils Absolute: 0.2 K/uL (ref 0.0–0.7)
Eosinophils Relative: 3.3 % (ref 0.0–5.0)
HCT: 37.9 % (ref 36.0–46.0)
Hemoglobin: 12.1 g/dL (ref 12.0–15.0)
Lymphocytes Relative: 31.5 % (ref 12.0–46.0)
Lymphs Abs: 2.1 K/uL (ref 0.7–4.0)
MCHC: 31.8 g/dL (ref 30.0–36.0)
MCV: 82 fl (ref 78.0–100.0)
Monocytes Absolute: 0.3 K/uL (ref 0.1–1.0)
Monocytes Relative: 4.4 % (ref 3.0–12.0)
Neutro Abs: 4.1 K/uL (ref 1.4–7.7)
Neutrophils Relative %: 60.1 % (ref 43.0–77.0)
Platelets: 272 K/uL (ref 150.0–400.0)
RBC: 4.63 Mil/uL (ref 3.87–5.11)
RDW: 14.9 % (ref 11.5–15.5)
WBC: 6.8 K/uL (ref 4.0–10.5)

## 2024-10-09 NOTE — Progress Notes (Signed)
 Established Patient Office Visit  Subjective:      CC:  Chief Complaint  Patient presents with   Acute Visit    HPI: Morgan [name] is a 73 y.o. female presenting on 10/09/2024 for Acute Visit .  Discussed the use of AI scribe software for clinical note transcription with the patient, who gave verbal consent to proceed.  History of Present Illness Morgan [name] is a 73 year old female who presents with persistent diarrhea and recent fall-related back pain.  She has experienced persistent watery diarrhea for years, requiring the use of protective garments due to its frequency and consistency. There is no mucus or blood in the stool. She was evaluated by a gastroenterologist approximately four to five months ago. She is on sertraline  but does not attribute her symptoms to this medication. She recently completed a six-day course of ciprofloxacin  for a suspected bladder infection. Nausea and vomiting have improved, but diarrhea persists. No fever, flank pain, or changes in bowel habits other than diarrhea.  She recently fell at home, tripping on a raised board on her deck and landing on her right side and upper back. She has pain in the upper back and rib area, especially when moving her arm or twisting. No head injury or difficulty breathing. She is concerned about potential rib or back injury from the fall.  Shingles x 3 weeks ago. Started with rash left upper chest to left upper back. Went to ER and they gave her benadryl  for the symptoms as she already had had the rash over 72 hours. Since the rash she has noticed neuralgia/paresthesia to these areas. Hasn't changed or progressed by not improving. Rash has healed but still with patchy red spots on her left upper chest that itch.        Social history:  Relevant past medical, surgical, family and social history reviewed and updated as indicated. Interim medical history since our last visit reviewed.  Allergies and medications  reviewed and updated.  DATA REVIEWED: CHART IN EPIC     ROS: Negative unless specifically indicated above in HPI.    Current Outpatient Medications:    acetaminophen  (TYLENOL ) 325 MG tablet, Take 2 tablets (650 mg total) by mouth every 4 (four) hours as needed for mild pain (or temp > 37.5 C (99.5 F))., Disp: , Rfl:    albuterol  (VENTOLIN  HFA) 108 (90 Base) MCG/ACT inhaler, Inhale 2 puffs into the lungs every 6 (six) hours as needed for wheezing or shortness of breath., Disp: 1 each, Rfl: 11   Budeson-Glycopyrrol-Formoterol  (BREZTRI  AEROSPHERE) 160-9-4.8 MCG/ACT AERO, Inhale 2 puffs into the lungs in the morning and at bedtime., Disp: 1 each, Rfl: 11   busPIRone  (BUSPAR ) 5 MG tablet, TAKE 1 TABLET BY MOUTH TWICE A DAY, Disp: 180 tablet, Rfl: 1   carbidopa -levodopa  (SINEMET  IR) 25-100 MG tablet, Take 2 tablets by mouth 2 (two) times daily., Disp: , Rfl:    cyanocobalamin  (VITAMIN B12) 100 MCG tablet, Take 100 mcg by mouth daily., Disp: , Rfl:    EPINEPHrine  0.3 mg/0.3 mL IJ SOAJ injection, Inject 0.3 mg into the muscle as needed for anaphylaxis., Disp: 1 each, Rfl: 0   fluticasone  (FLONASE ) 50 MCG/ACT nasal spray, SPRAY 2 SPRAYS INTO EACH NOSTRIL EVERY DAY, Disp: 48 mL, Rfl: 2   folic acid  (FOLVITE ) 1 MG tablet, TAKE 1 TABLET BY MOUTH EVERY DAY, Disp: 90 tablet, Rfl: 1   hydrOXYzine  (VISTARIL ) 50 MG capsule, TAKE 1 CAPSULE BY MOUTH 3 TIMES DAILY  AS NEEDED., Disp: 270 capsule, Rfl: 1   ibuprofen  (ADVIL ) 400 MG tablet, Take 1 tablet (400 mg total) by mouth every 8 (eight) hours as needed for headache or moderate pain., Disp: 20 tablet, Rfl: 0   lamoTRIgine (LAMICTAL) 25 MG tablet, Take 50 mg by mouth 2 (two) times daily., Disp: , Rfl:    LORazepam  (ATIVAN ) 0.5 MG tablet, Take 1 tablet (0.5 mg total) by mouth at bedtime as needed for anxiety., Disp: 30 tablet, Rfl: 0   NUCALA 100 MG/ML SOAJ, , Disp: , Rfl:    ondansetron  (ZOFRAN ) 4 MG tablet, Take 1 tablet (4 mg total) by mouth every 8 (eight)  hours as needed for nausea or vomiting., Disp: 20 tablet, Rfl: 0   OXYGEN , Inhale 3 L/min into the lungs See admin instructions. 3 L/min at bedtime and as needed for shortness of breath throughout the day, Disp: , Rfl:    phenazopyridine (PYRIDIUM) 100 MG tablet, Take 1 tablet (100 mg total) by mouth 3 (three) times daily as needed for pain., Disp: 6 tablet, Rfl: 0   potassium chloride  SA (KLOR-CON  M) 20 MEQ tablet, TAKE 1 TABLET BY MOUTH EVERY DAY, Disp: 90 tablet, Rfl: 1   pregabalin  (LYRICA ) 50 MG capsule, Take 1 capsule (50 mg total) by mouth 3 (three) times daily., Disp: 90 capsule, Rfl: 1   promethazine  (PHENERGAN ) 25 MG tablet, Take 1 tablet (25 mg total) by mouth every 6 (six) hours as needed., Disp: 30 tablet, Rfl: 0   sertraline  (ZOLOFT ) 100 MG tablet, Take two tablets once daily for total 200 mg daily, Disp: 180 tablet, Rfl: 3   tiZANidine  (ZANAFLEX ) 4 MG tablet, Take 1 tablet (4 mg total) by mouth every 8 (eight) hours as needed for muscle spasms., Disp: 30 tablet, Rfl: 0   topiramate  (TOPAMAX ) 50 MG tablet, TAKE 1 TABLET BY MOUTH EVERYDAY AT BEDTIME, Disp: 90 tablet, Rfl: 1   Vitamin D , Ergocalciferol , (DRISDOL) 1.25 MG (50000 UNIT) CAPS capsule, Take 50,000 Units by mouth once a week., Disp: , Rfl:         Objective:        BP (!) 126/92 (BP Location: Left Arm, Patient Position: Sitting, Cuff Size: Large)   Pulse 74   Temp 98.4 F (36.9 C) (Temporal)   Ht 6' 1 (1.854 m)   Wt 198 lb (89.8 kg)   SpO2 98%   BMI 26.12 kg/m   Physical Exam ABDOMEN: Tenderness on palpation. MUSCULOSKELETAL: Tenderness on palpation of the upper back.  Wt Readings from Last 3 Encounters:  10/09/24 198 lb (89.8 kg)  09/29/24 184 lb 3.7 oz (83.6 kg)  09/02/24 187 lb 12.8 oz (85.2 kg)    Physical Exam Vitals reviewed.  Constitutional:      General: She is not in acute distress.    Appearance: Normal appearance. She is normal weight. She is not ill-appearing, toxic-appearing or  diaphoretic.  HENT:     Head: Normocephalic.  Cardiovascular:     Rate and Rhythm: Normal rate.  Pulmonary:     Effort: Pulmonary effort is normal.  Chest:     Chest wall: Tenderness (palpable posterior right rib midline, worsened with movement) present.  Musculoskeletal:        General: Normal range of motion.  Neurological:     General: No focal deficit present.     Mental Status: She is alert and oriented to person, place, and time. Mental status is at baseline.  Psychiatric:  Mood and Affect: Mood normal.        Behavior: Behavior normal.        Thought Content: Thought content normal.        Judgment: Judgment normal.          Results RADIOLOGY CT scan: Stable granuloma at the left lung base; cystitis.  Assessment & Plan:   Assessment and Plan Assessment & Plan Abdominal pain and diarrhea Abdominal pain has improved with cessation of vomiting. Diarrhea remains chronic and unchanged, with watery consistency and no mucus or blood. Previous CT scan showed granuloma at the left lung base and bladder inflammation. No constipation reported. Chronic diarrhea predates sertraline  use. Gastroenterologist consulted 4-5 months ago. - Repeat urinalysis to check for ongoing issues. - Check kidney function and white blood cell count to rule out kidney infection.  Bladder inflammation (cystitis) Bladder inflammation suspected on previous CT scan. Completed a six-day course of Cipro . No fever or flank pain reported. Concerns about potential kidney infection due to pain location and previous hematuria. - Repeat urinalysis to assess for ongoing bladder inflammation or infection. - Depending on urine dip results, consider extending Cipro  for an additional seven days.  Right upper back/rib pain after fall Pain in the right upper back and rib area after falling on the deck. Pain occurs with arm movement or twisting. No difficulty breathing or shoulder injury reported. Concerns about  potential rib injury or kidney involvement due to pain location. - Order right rib x-ray to assess for fractures or injuries.  Shingles: likely self limited suspected PHN continue symptom control management. Pt already on lyrica , I do not want to increase or add another sedative medication. Recommendation for calamine lotion for itch. Please monitor site for worsening signs/symptoms of infection to include: increasing redness, increasing tenderness, increase in size, and or pustulant drainage from site. If this is to occur please let me know immediately.        Return in about 3 months (around 01/09/2025).     Ginger Patrick, MSN, APRN, FNP-C [facility]

## 2024-10-10 LAB — URINE CULTURE
MICRO NUMBER:: 17144687
SPECIMEN QUALITY:: ADEQUATE

## 2024-10-16 ENCOUNTER — Encounter

## 2024-10-25 DIAGNOSIS — I509 Heart failure, unspecified: Secondary | ICD-10-CM | POA: Diagnosis not present

## 2024-10-27 DIAGNOSIS — I509 Heart failure, unspecified: Secondary | ICD-10-CM | POA: Diagnosis not present

## 2024-10-30 ENCOUNTER — Encounter

## 2024-11-23 DIAGNOSIS — M7918 Myalgia, other site: Secondary | ICD-10-CM | POA: Diagnosis not present

## 2024-11-23 DIAGNOSIS — H9313 Tinnitus, bilateral: Secondary | ICD-10-CM | POA: Diagnosis not present

## 2024-11-23 DIAGNOSIS — H6991 Unspecified Eustachian tube disorder, right ear: Secondary | ICD-10-CM | POA: Diagnosis not present

## 2024-11-23 DIAGNOSIS — M5481 Occipital neuralgia: Secondary | ICD-10-CM | POA: Diagnosis not present

## 2024-11-24 ENCOUNTER — Other Ambulatory Visit: Payer: Self-pay | Admitting: Family

## 2024-11-24 DIAGNOSIS — J449 Chronic obstructive pulmonary disease, unspecified: Secondary | ICD-10-CM | POA: Diagnosis not present

## 2024-11-24 DIAGNOSIS — I509 Heart failure, unspecified: Secondary | ICD-10-CM | POA: Diagnosis not present

## 2024-11-24 DIAGNOSIS — F4323 Adjustment disorder with mixed anxiety and depressed mood: Secondary | ICD-10-CM

## 2024-11-26 DIAGNOSIS — I509 Heart failure, unspecified: Secondary | ICD-10-CM | POA: Diagnosis not present

## 2024-11-26 DIAGNOSIS — J449 Chronic obstructive pulmonary disease, unspecified: Secondary | ICD-10-CM | POA: Diagnosis not present

## 2024-12-02 DIAGNOSIS — H5213 Myopia, bilateral: Secondary | ICD-10-CM | POA: Diagnosis not present

## 2024-12-02 DIAGNOSIS — H2513 Age-related nuclear cataract, bilateral: Secondary | ICD-10-CM | POA: Diagnosis not present

## 2024-12-13 ENCOUNTER — Other Ambulatory Visit: Payer: Self-pay | Admitting: Family

## 2024-12-13 DIAGNOSIS — F4323 Adjustment disorder with mixed anxiety and depressed mood: Secondary | ICD-10-CM

## 2024-12-14 ENCOUNTER — Other Ambulatory Visit: Payer: Self-pay | Admitting: Family

## 2024-12-14 DIAGNOSIS — F411 Generalized anxiety disorder: Secondary | ICD-10-CM

## 2024-12-28 ENCOUNTER — Inpatient Hospital Stay: Attending: Oncology

## 2024-12-28 DIAGNOSIS — D508 Other iron deficiency anemias: Secondary | ICD-10-CM

## 2024-12-28 LAB — CMP (CANCER CENTER ONLY)
ALT: 9 U/L (ref 0–44)
AST: 16 U/L (ref 15–41)
Albumin: 4.2 g/dL (ref 3.5–5.0)
Alkaline Phosphatase: 120 U/L (ref 38–126)
Anion gap: 13 (ref 5–15)
BUN: 32 mg/dL — ABNORMAL HIGH (ref 8–23)
CO2: 23 mmol/L (ref 22–32)
Calcium: 9.4 mg/dL (ref 8.9–10.3)
Chloride: 100 mmol/L (ref 98–111)
Creatinine: 1.06 mg/dL — ABNORMAL HIGH (ref 0.44–1.00)
GFR, Estimated: 56 mL/min — ABNORMAL LOW
Glucose, Bld: 107 mg/dL — ABNORMAL HIGH (ref 70–99)
Potassium: 3.9 mmol/L (ref 3.5–5.1)
Sodium: 136 mmol/L (ref 135–145)
Total Bilirubin: 0.2 mg/dL (ref 0.0–1.2)
Total Protein: 6.8 g/dL (ref 6.5–8.1)

## 2024-12-28 LAB — CBC WITH DIFFERENTIAL (CANCER CENTER ONLY)
Abs Immature Granulocytes: 0.04 K/uL (ref 0.00–0.07)
Basophils Absolute: 0 K/uL (ref 0.0–0.1)
Basophils Relative: 0 %
Eosinophils Absolute: 0.2 K/uL (ref 0.0–0.5)
Eosinophils Relative: 2 %
HCT: 34.8 % — ABNORMAL LOW (ref 36.0–46.0)
Hemoglobin: 11.2 g/dL — ABNORMAL LOW (ref 12.0–15.0)
Immature Granulocytes: 1 %
Lymphocytes Relative: 27 %
Lymphs Abs: 2.1 K/uL (ref 0.7–4.0)
MCH: 26 pg (ref 26.0–34.0)
MCHC: 32.2 g/dL (ref 30.0–36.0)
MCV: 80.7 fL (ref 80.0–100.0)
Monocytes Absolute: 0.4 K/uL (ref 0.1–1.0)
Monocytes Relative: 5 %
Neutro Abs: 5.1 K/uL (ref 1.7–7.7)
Neutrophils Relative %: 65 %
Platelet Count: 272 K/uL (ref 150–400)
RBC: 4.31 MIL/uL (ref 3.87–5.11)
RDW: 15 % (ref 11.5–15.5)
WBC Count: 7.8 K/uL (ref 4.0–10.5)
nRBC: 0 % (ref 0.0–0.2)

## 2024-12-28 LAB — IRON AND TIBC
Iron: 64 ug/dL (ref 28–170)
Saturation Ratios: 21 % (ref 10.4–31.8)
TIBC: 304 ug/dL (ref 250–450)
UIBC: 240 ug/dL

## 2024-12-28 LAB — RETIC PANEL
Immature Retic Fract: 4.9 % (ref 2.3–15.9)
RBC.: 4.36 MIL/uL (ref 3.87–5.11)
Retic Count, Absolute: 48 K/uL (ref 19.0–186.0)
Retic Ct Pct: 1.1 % (ref 0.4–3.1)
Reticulocyte Hemoglobin: 29.8 pg

## 2024-12-28 LAB — VITAMIN B12: Vitamin B-12: 414 pg/mL (ref 180–914)

## 2024-12-28 LAB — FERRITIN: Ferritin: 107 ng/mL (ref 11–307)

## 2025-01-04 ENCOUNTER — Inpatient Hospital Stay: Admitting: Oncology

## 2025-01-04 ENCOUNTER — Encounter: Payer: Self-pay | Admitting: Oncology

## 2025-01-04 ENCOUNTER — Inpatient Hospital Stay

## 2025-01-04 VITALS — BP 137/85 | HR 70

## 2025-01-04 VITALS — BP 143/88 | HR 92 | Temp 97.6°F | Resp 20 | Wt 185.6 lb

## 2025-01-04 DIAGNOSIS — D631 Anemia in chronic kidney disease: Secondary | ICD-10-CM | POA: Diagnosis not present

## 2025-01-04 DIAGNOSIS — N1831 Chronic kidney disease, stage 3a: Secondary | ICD-10-CM

## 2025-01-04 DIAGNOSIS — D508 Other iron deficiency anemias: Secondary | ICD-10-CM | POA: Diagnosis not present

## 2025-01-04 DIAGNOSIS — R5383 Other fatigue: Secondary | ICD-10-CM | POA: Diagnosis not present

## 2025-01-04 DIAGNOSIS — R5382 Chronic fatigue, unspecified: Secondary | ICD-10-CM | POA: Insufficient documentation

## 2025-01-04 MED ORDER — VITAMIN B-12 1000 MCG SL SUBL: 1.0000 | SUBLINGUAL_TABLET | Freq: Every day | SUBLINGUAL | Status: AC

## 2025-01-04 MED ORDER — IRON SUCROSE 20 MG/ML IV SOLN
200.0000 mg | Freq: Once | INTRAVENOUS | Status: AC
  Administered 2025-01-04: 200 mg via INTRAVENOUS
  Filled 2025-01-04: qty 10

## 2025-01-04 NOTE — Assessment & Plan Note (Signed)
 Recommend B12 sublingual 1000 mcg daily. Repeat B12 at next visit.

## 2025-01-04 NOTE — Assessment & Plan Note (Signed)
 Encourage oral hydration and avoid nephrotoxins.

## 2025-01-04 NOTE — Progress Notes (Signed)
 " Hematology/Oncology Progress note [phone] [phone]      Patient Care Team: Corwin Antu, FNP as PCP - General (Family Medicine) Fate Morna SAILOR, [facility] (Inactive) as Pharmacist (Pharmacist) Babara Call, MD as Consulting Physician (Hematology and Oncology) Maree Jannett POUR, MD as Consulting Physician (Neurology) Ermalinda Lenn HERO, LCSW as Social Worker  CHIEF COMPLAINTS/REASON FOR VISIT:  Follow-up for iron  deficiency anemia, CKD.  ASSESSMENT & PLAN:   Anemia in chronic kidney disease (CKD) Iron  deficiency anemia in the context of chronic kidney disease.  Labs are reviewed and discussed with patient. Lab Results  Component Value Date   HGB 11.2 (L) 12/28/2024   TIBC 304 12/28/2024   IRONPCTSAT 21 12/28/2024   FERRITIN 107 12/28/2024   Recommend Venofer  200mg  x 2 doses to increase iron  store.  Goal of ferritin > = 200.   CKD (chronic kidney disease) stage 3, GFR 30-59 ml/min (HCC) Encourage oral hydration and avoid nephrotoxins.    Fatigue Recommend B12 sublingual 1000 mcg daily. Repeat B12 at next visit.    Orders Placed This Encounter  Procedures   CMP (Cancer Center only)    Standing Status:   Future    Expected Date:   05/04/2025    Expiration Date:   08/02/2025   CBC with Differential (Cancer Center Only)    Standing Status:   Future    Expected Date:   05/04/2025    Expiration Date:   08/02/2025   Iron  and TIBC    Standing Status:   Future    Expected Date:   05/04/2025    Expiration Date:   08/02/2025   Ferritin    Standing Status:   Future    Expected Date:   05/04/2025    Expiration Date:   08/02/2025   Retic Panel    Standing Status:   Future    Expected Date:   05/04/2025    Expiration Date:   08/02/2025   Vitamin B12    Standing Status:   Future    Expected Date:   05/04/2025    Expiration Date:   08/02/2025   Follow up in 6 months All questions were answered. The patient knows to call the clinic with any problems, questions  or concerns.  Call Babara, MD, PhD [facility] Hematology Oncology 01/04/2025   HISTORY OF PRESENTING ILLNESS:   Morgan [name] is a  74 y.o.  female with PMH listed below was seen in consultation at the request of  Corwin Antu, FNP  for evaluation of anemia Patient had blood work done recently on 08/29/2021. CBC showed a hemoglobin of 9.9, MCV 75.5. Patient denies any black stool or blood in the stool. She has a history of gastric stenosis for which she got EGD dilation periodically.  Last EGD was 08/11/2020. Patient reports feeling tired and fatigued. She has taken oral iron  supplementation before.  Iron  has made her constipation worse.  Colonoscopy was done on 08/20/23 results were reviewed.  INTERVAL HISTORY Morgan [name] is a 74 y.o. female who has above history reviewed by me today presents for follow up visit for management of iron  deficiency anemia and CKD.  Patient feels well at baseline.  Denies any bleeding events.  She feels more tired.    Review of Systems  Constitutional:  Positive for fatigue. Negative for appetite change, chills and fever.  HENT:   Negative for hearing loss and voice change.   Eyes:  Negative for eye problems.  Respiratory:  Negative for  chest tightness and cough.   Cardiovascular:  Negative for chest pain.  Gastrointestinal:  Negative for abdominal distention, abdominal pain and blood in stool.  Endocrine: Negative for hot flashes.  Genitourinary:  Negative for difficulty urinating and frequency.   Musculoskeletal:  Negative for arthralgias.  Skin:  Negative for itching and rash.  Neurological:  Negative for extremity weakness.  Hematological:  Negative for adenopathy.  Psychiatric/Behavioral:  Negative for confusion.     MEDICAL HISTORY:  Past Medical History:  Diagnosis Date   Allergy    Anemia    Anxiety    Arthritis    CHF (congestive heart failure) (HCC)    Chronic kidney disease    COPD (chronic obstructive pulmonary disease) (HCC)     Depression    GERD (gastroesophageal reflux disease)    Headache    Hyperlipidemia    Hypertension    MVP (mitral valve prolapse)    Parkinson disease (HCC)    Vascular dementia (HCC)     SURGICAL HISTORY: Past Surgical History:  Procedure Laterality Date   ABDOMINAL HYSTERECTOMY     APPENDECTOMY     BALLOON DILATION N/A 04/21/2020   Procedure: BALLOON DILATION;  Surgeon: Teressa Toribio SQUIBB, MD;  Location: [facility];  Service: Endoscopy;  Laterality: N/A;  pyloric/ GI   BALLOON DILATION N/A 07/04/2020   Procedure: BALLOON DILATION;  Surgeon: Shila Gustav GAILS, MD;  Location: [facility];  Service: Endoscopy;  Laterality: N/A;   BIOPSY  04/21/2020   Procedure: BIOPSY;  Surgeon: Teressa Toribio SQUIBB, MD;  Location: [facility];  Service: Endoscopy;;   BIOPSY  07/04/2020   Procedure: BIOPSY;  Surgeon: Shila Gustav GAILS, MD;  Location: [facility];  Service: Endoscopy;;  EGD and COLON   BREAST SURGERY Right 1988   tumor removal   CHOLECYSTECTOMY N/A 05/18/2020   Procedure: LAPAROSCOPIC CHOLECYSTECTOMY;  Surgeon: Gladis Cough, MD;  Location: [facility];  Service: General;  Laterality: N/A;   COLONOSCOPY WITH PROPOFOL  N/A 07/04/2020   Procedure: COLONOSCOPY WITH PROPOFOL ;  Surgeon: Shila Gustav GAILS, MD;  Location: [facility];  Service: Endoscopy;  Laterality: N/A;   COLONOSCOPY WITH PROPOFOL  N/A 11/03/2021   Procedure: COLONOSCOPY WITH PROPOFOL ;  Surgeon: Maryruth Ole DASEN, MD;  Location: [facility];  Service: Endoscopy;  Laterality: N/A;   COLONOSCOPY WITH PROPOFOL  N/A 08/20/2023   Procedure: COLONOSCOPY WITH PROPOFOL ;  Surgeon: Maryruth Ole DASEN, MD;  Location: [facility];  Service: Endoscopy;  Laterality: N/A;   ESOPHAGEAL DILATION  08/11/2020   Procedure: PYLORIC DILATION;  Surgeon: Legrand Victory LITTIE DOUGLAS, MD;  Location: [facility];  Service: Gastroenterology;;   ESOPHAGOGASTRODUODENOSCOPY (EGD) WITH PROPOFOL  N/A 04/21/2020   Procedure: ESOPHAGOGASTRODUODENOSCOPY (EGD) WITH  PROPOFOL ;  Surgeon: Teressa Toribio SQUIBB, MD;  Location: [facility];  Service: Endoscopy;  Laterality: N/A;   ESOPHAGOGASTRODUODENOSCOPY (EGD) WITH PROPOFOL  N/A 07/04/2020   Procedure: ESOPHAGOGASTRODUODENOSCOPY (EGD) WITH PROPOFOL ;  Surgeon: Shila Gustav GAILS, MD;  Location: [facility];  Service: Endoscopy;  Laterality: N/A;   ESOPHAGOGASTRODUODENOSCOPY (EGD) WITH PROPOFOL  N/A 08/11/2020   Procedure: ESOPHAGOGASTRODUODENOSCOPY (EGD) WITH PROPOFOL ;  Surgeon: Legrand Victory LITTIE DOUGLAS, MD;  Location: [facility];  Service: Gastroenterology;  Laterality: N/A;   ESOPHAGOGASTRODUODENOSCOPY (EGD) WITH PROPOFOL  N/A 11/03/2021   Procedure: ESOPHAGOGASTRODUODENOSCOPY (EGD) WITH PROPOFOL ;  Surgeon: Maryruth Ole DASEN, MD;  Location: [facility];  Service: Endoscopy;  Laterality: N/A;   ESOPHAGOGASTRODUODENOSCOPY (EGD) WITH PROPOFOL  N/A 05/07/2022   Procedure: ESOPHAGOGASTRODUODENOSCOPY (EGD) WITH PROPOFOL ;  Surgeon: Unk Corinn Skiff, MD;  Location: [facility];  Service: Gastroenterology;  Laterality:  N/A;   HEMOSTASIS CLIP PLACEMENT  08/20/2023   Procedure: HEMOSTASIS CLIP PLACEMENT;  Surgeon: Maryruth Ole DASEN, MD;  Location: [facility];  Service: Endoscopy;;   KNEE SURGERY  2005   2 times left   KNEE SURGERY Right    KYPHOPLASTY N/A 07/20/2021   Procedure: T 10KYPHOPLASTY;  Surgeon: Kathlynn Sharper, MD;  Location: [facility];  Service: Orthopedics;  Laterality: N/A;   KYPHOPLASTY N/A 08/23/2021   Procedure: T9 KYPHOPLASTY;  Surgeon: Kathlynn Sharper, MD;  Location: [facility];  Service: Orthopedics;  Laterality: N/A;   POLYPECTOMY  07/04/2020   Procedure: POLYPECTOMY;  Surgeon: Shila Gustav GAILS, MD;  Location: [facility];  Service: Endoscopy;;   POLYPECTOMY  08/20/2023   Procedure: POLYPECTOMY;  Surgeon: Maryruth Ole DASEN, MD;  Location: [facility];  Service: Endoscopy;;   SUBMUCOSAL TATTOO INJECTION  08/20/2023   Procedure: SUBMUCOSAL TATTOO INJECTION;  Surgeon: Maryruth Ole DASEN, MD;  Location:  [facility];  Service: Endoscopy;;   TUBAL LIGATION      SOCIAL HISTORY: Social History   Socioeconomic History   Marital status: Single    Spouse name: Not on file   Number of children: 2   Years of education: Not on file   Highest education level: Some college, no degree  Occupational History   Not on file  Tobacco Use   Smoking status: Former    Current packs/day: 0.00    Average packs/day: 1 pack/day for 15.0 years (15.0 ttl pk-yrs)    Types: Cigarettes    Start date: 09/12/2002    Quit date: 09/12/2017    Years since quitting: 7.3    Passive exposure: Past   Smokeless tobacco: Never  Vaping Use   Vaping status: Never Used  Substance and Sexual Activity   Alcohol  use: Never   Drug use: No   Sexual activity: Not Currently  Other Topics Concern   Not on file  Social History Narrative   Lives with Grandson's great grandparents    Grandson - 53 years old ([place])   Daughter - Bruno - [place]   Support system - family, aunt, Inocente and Christopher (who she lives with), brother   Transportation: roommates   Enjoy: [employer], playing with grandson   Exercise: PT exercises   Diet: good - chicken, some steak, tries to eat healthy, avoids fried foods   Retired from being a LAWYER   Social Drivers of Health   Tobacco Use: Medium Risk (01/04/2025)   Patient History    Smoking Tobacco Use: Former    Smokeless Tobacco Use: Never    Passive Exposure: Past  Physicist, Medical Strain: Low Risk (02/03/2024)   Overall Financial Resource Strain (CARDIA)    Difficulty of Paying Living Expenses: Not very hard  Food Insecurity: No Food Insecurity (02/03/2024)   Hunger Vital Sign    Worried About Running Out of Food in the Last Year: Never true    Ran Out of Food in the Last Year: Never true  Transportation Needs: No Transportation Needs (02/03/2024)   PRAPARE - Administrator, Civil Service (Medical): No    Lack of Transportation (Non-Medical): No   Physical Activity: Insufficiently Active (02/03/2024)   Exercise Vital Sign    Days of Exercise per Week: 2 days    Minutes of Exercise per Session: 30 min  Stress: Stress Concern Present (02/03/2024)   [employer] of Occupational Health - Occupational Stress Questionnaire    Feeling of Stress : To some extent  Social Connections: Moderately Isolated (02/03/2024)   Social Connection and Isolation Panel    Frequency of Communication with Friends and Family: More than three times a week    Frequency of Social Gatherings with Friends and Family: Once a week    Attends Religious Services: More than 4 times per year    Active Member of [employer] or Organizations: No    Attends Banker Meetings: Never    Marital Status: Divorced  Catering Manager Violence: Not At Risk (11/19/2023)   Humiliation, Afraid, Rape, and Kick questionnaire    Fear of Current or Ex-Partner: No    Emotionally Abused: No    Physically Abused: No    Sexually Abused: No  Depression (PHQ2-9): Medium Risk (09/02/2024)   Depression (PHQ2-9)    PHQ-2 Score: 6  Alcohol  Screen: Low Risk (10/15/2023)   Alcohol  Screen    Last Alcohol  Screening Score (AUDIT): 0  Housing: Low Risk (02/03/2024)   Housing Stability Vital Sign    Unable to Pay for Housing in the Last Year: No    Number of Times Moved in the Last Year: 0    Homeless in the Last Year: No  Utilities: Not At Risk (11/19/2023)   AHC Utilities    Threatened with loss of utilities: No  Health Literacy: Adequate Health Literacy (10/15/2023)   B1300 Health Literacy    Frequency of need for help with medical instructions: Never    FAMILY HISTORY: Family History  Problem Relation Age of Onset   Breast cancer Mother 50   Ovarian cancer Mother 13   Brain cancer Mother    Hypertension Father    Hyperlipidemia Father    Heart disease Father    Colon cancer Father        diagnosed in his 24s   Breast cancer Maternal Grandmother 53   Diabetes Paternal  Grandmother    Breast cancer Paternal Grandmother 8   Other Son        drug over dose   Colon cancer Maternal Grandfather        dx in his 50s   Liver disease Maternal Grandfather    Breast cancer Maternal Aunt 60   Breast cancer Maternal Aunt 60   Colon cancer Maternal Uncle    Stomach cancer Neg Hx    Pancreatic cancer Neg Hx    Esophageal cancer Neg Hx     ALLERGIES:  is allergic to angiotensin receptor blockers, meperidine, strawberry extract, wasp venom protein, sucralfate , valtrex  [valacyclovir ], and wasp venom.  MEDICATIONS:  Current Outpatient Medications  Medication Sig Dispense Refill   acetaminophen  (TYLENOL ) 325 MG tablet Take 2 tablets (650 mg total) by mouth every 4 (four) hours as needed for mild pain (or temp > 37.5 C (99.5 F)).     albuterol  (VENTOLIN  HFA) 108 (90 Base) MCG/ACT inhaler Inhale 2 puffs into the lungs every 6 (six) hours as needed for wheezing or shortness of breath. 1 each 11   Budeson-Glycopyrrol-Formoterol  (BREZTRI  AEROSPHERE) 160-9-4.8 MCG/ACT AERO Inhale 2 puffs into the lungs in the morning and at bedtime. 1 each 11   busPIRone  (BUSPAR ) 5 MG tablet TAKE 1 TABLET BY MOUTH TWICE A DAY 180 tablet 1   carbidopa -levodopa  (SINEMET  IR) 25-100 MG tablet Take 2 tablets by mouth 2 (two) times daily.     Cyanocobalamin  (VITAMIN B-12) 1000 MCG SUBL Place 1 tablet (1,000 mcg total) under the tongue daily.     EPINEPHrine  0.3 mg/0.3 mL IJ SOAJ injection Inject 0.3 mg into  the muscle as needed for anaphylaxis. 1 each 0   fluticasone  (FLONASE ) 50 MCG/ACT nasal spray SPRAY 2 SPRAYS INTO EACH NOSTRIL EVERY DAY 48 mL 2   folic acid  (FOLVITE ) 1 MG tablet TAKE 1 TABLET BY MOUTH EVERY DAY 90 tablet 1   hydrOXYzine  (VISTARIL ) 50 MG capsule TAKE 1 CAPSULE BY MOUTH THREE TIMES A DAY AS NEEDED 270 capsule 1   ibuprofen  (ADVIL ) 400 MG tablet Take 1 tablet (400 mg total) by mouth every 8 (eight) hours as needed for headache or moderate pain. 20 tablet 0   lamoTRIgine  (LAMICTAL) 25 MG tablet Take 50 mg by mouth 2 (two) times daily.     LORazepam  (ATIVAN ) 0.5 MG tablet Take 1 tablet (0.5 mg total) by mouth at bedtime as needed for anxiety. 30 tablet 0   NUCALA 100 MG/ML SOAJ      ondansetron  (ZOFRAN ) 4 MG tablet Take 1 tablet (4 mg total) by mouth every 8 (eight) hours as needed for nausea or vomiting. 20 tablet 0   OXYGEN  Inhale 3 L/min into the lungs See admin instructions. 3 L/min at bedtime and as needed for shortness of breath throughout the day     phenazopyridine  (PYRIDIUM ) 100 MG tablet Take 1 tablet (100 mg total) by mouth 3 (three) times daily as needed for pain. 6 tablet 0   potassium chloride  SA (KLOR-CON  M) 20 MEQ tablet TAKE 1 TABLET BY MOUTH EVERY DAY 90 tablet 1   pregabalin  (LYRICA ) 50 MG capsule Take 1 capsule (50 mg total) by mouth 3 (three) times daily. 90 capsule 1   promethazine  (PHENERGAN ) 25 MG tablet Take 1 tablet (25 mg total) by mouth every 6 (six) hours as needed. 30 tablet 0   sertraline  (ZOLOFT ) 100 MG tablet TAKE 1 TABLET BY MOUTH EVERY DAY 90 tablet 3   topiramate  (TOPAMAX ) 50 MG tablet TAKE 1 TABLET BY MOUTH EVERYDAY AT BEDTIME 90 tablet 1   Vitamin D , Ergocalciferol , (DRISDOL) 1.25 MG (50000 UNIT) CAPS capsule Take 50,000 Units by mouth once a week.     tiZANidine  (ZANAFLEX ) 4 MG tablet Take 1 tablet (4 mg total) by mouth every 8 (eight) hours as needed for muscle spasms. (Patient not taking: Reported on 01/04/2025) 30 tablet 0   No current facility-administered medications for this visit.     PHYSICAL EXAMINATION: ECOG PERFORMANCE STATUS: 1 - Symptomatic but completely ambulatory Vitals:   01/04/25 1027  BP: (!) 143/88  Pulse: 92  Resp: 20  Temp: 97.6 F (36.4 C)  SpO2: 100%   Filed Weights   01/04/25 1027  Weight: 185 lb 9.6 oz (84.2 kg)     Physical Exam Constitutional:      General: She is not in acute distress. HENT:     Head: Normocephalic and atraumatic.  Eyes:     General: No scleral  icterus. Cardiovascular:     Rate and Rhythm: Normal rate and regular rhythm.     Heart sounds: Normal heart sounds.  Pulmonary:     Effort: Pulmonary effort is normal. No respiratory distress.     Breath sounds: No wheezing.  Abdominal:     General: Bowel sounds are normal. There is no distension.     Palpations: Abdomen is soft.  Musculoskeletal:        General: No deformity. Normal range of motion.     Cervical back: Normal range of motion and neck supple.  Skin:    General: Skin is warm and dry.  Findings: No erythema or rash.  Neurological:     Mental Status: She is alert and oriented to person, place, and time. Mental status is at baseline.     Cranial Nerves: No cranial nerve deficit.     Coordination: Coordination normal.  Psychiatric:        Mood and Affect: Mood normal.     LABORATORY DATA:  I have reviewed the data as listed     Latest Ref Rng & Units 12/28/2024    8:03 AM 10/09/2024    9:32 AM 09/29/2024    1:54 PM  CBC  WBC 4.0 - 10.5 K/uL 7.8  6.8  8.0   Hemoglobin 12.0 - 15.0 g/dL 88.7  87.8  87.5   Hematocrit 36.0 - 46.0 % 34.8  37.9  38.3   Platelets 150 - 400 K/uL 272  272.0  256.0       Latest Ref Rng & Units 12/28/2024    8:03 AM 10/09/2024    9:32 AM 09/29/2024    1:54 PM  CMP  Glucose 70 - 99 mg/dL 892  90  894   BUN 8 - 23 mg/dL 32  26  27   Creatinine 0.44 - 1.00 mg/dL 8.93  9.04  9.05   Sodium 135 - 145 mmol/L 136  139  140   Potassium 3.5 - 5.1 mmol/L 3.9  3.9  3.6   Chloride 98 - 111 mmol/L 100  103  105   CO2 22 - 32 mmol/L 23  26  28    Calcium  8.9 - 10.3 mg/dL 9.4  9.3  9.0   Total Protein 6.5 - 8.1 g/dL 6.8  6.8  6.9   Total Bilirubin 0.0 - 1.2 mg/dL 0.2  0.3  0.3   Alkaline Phos 38 - 126 U/L 120  88  101   AST 15 - 41 U/L 16  16  12    ALT 0 - 44 U/L 9  8  4      Iron /TIBC/Ferritin/ %Sat    Component Value Date/Time   IRON  64 12/28/2024 0803   TIBC 304 12/28/2024 0803   FERRITIN 107 12/28/2024 0803   IRONPCTSAT 21  12/28/2024 0803       RADIOGRAPHIC STUDIES: I have personally reviewed the radiological images as listed and agreed with the findings in the report. No results found.    "

## 2025-01-04 NOTE — Assessment & Plan Note (Addendum)
 Iron  deficiency anemia in the context of chronic kidney disease.  Labs are reviewed and discussed with patient. Lab Results  Component Value Date   HGB 11.2 (L) 12/28/2024   TIBC 304 12/28/2024   IRONPCTSAT 21 12/28/2024   FERRITIN 107 12/28/2024   Recommend Venofer  200mg  x 2 doses to increase iron  store.  Goal of ferritin > = 200.

## 2025-01-04 NOTE — Patient Instructions (Signed)

## 2025-01-20 ENCOUNTER — Encounter: Payer: Self-pay | Admitting: Family Medicine

## 2025-01-20 ENCOUNTER — Ambulatory Visit: Admitting: Family Medicine

## 2025-01-20 ENCOUNTER — Ambulatory Visit: Payer: Self-pay

## 2025-01-20 VITALS — BP 144/92 | HR 76 | Temp 98.5°F | Ht 73.0 in | Wt 187.0 lb

## 2025-01-20 DIAGNOSIS — I701 Atherosclerosis of renal artery: Secondary | ICD-10-CM

## 2025-01-20 DIAGNOSIS — E559 Vitamin D deficiency, unspecified: Secondary | ICD-10-CM

## 2025-01-20 DIAGNOSIS — E538 Deficiency of other specified B group vitamins: Secondary | ICD-10-CM

## 2025-01-20 DIAGNOSIS — I10 Essential (primary) hypertension: Secondary | ICD-10-CM

## 2025-01-20 DIAGNOSIS — G20C Parkinsonism, unspecified: Secondary | ICD-10-CM

## 2025-01-20 DIAGNOSIS — R5382 Chronic fatigue, unspecified: Secondary | ICD-10-CM

## 2025-01-20 DIAGNOSIS — F4323 Adjustment disorder with mixed anxiety and depressed mood: Secondary | ICD-10-CM

## 2025-01-20 DIAGNOSIS — I7789 Other specified disorders of arteries and arterioles: Secondary | ICD-10-CM

## 2025-01-20 DIAGNOSIS — R42 Dizziness and giddiness: Secondary | ICD-10-CM

## 2025-01-20 DIAGNOSIS — I6523 Occlusion and stenosis of bilateral carotid arteries: Secondary | ICD-10-CM

## 2025-01-20 DIAGNOSIS — G6289 Other specified polyneuropathies: Secondary | ICD-10-CM

## 2025-01-20 NOTE — Telephone Encounter (Signed)
 FYI Only or Action Required?: FYI only for provider: appointment scheduled on 01/20/25.  Patient was last seen in primary care on 10/09/2024 by Corwin Antu, FNP.  Called Nurse Triage reporting Dizziness.  Symptoms began a week ago.  Interventions attempted: Other: Vitamin B 12, Excedrin , fluids.  Symptoms are: stable.  Triage Disposition: See Physician Within 24 Hours  Patient/caregiver understands and will follow disposition?: Yes              Message from Leah C sent at 01/20/2025  9:15 AM EST  Reason for Triage: Pt is extremely fatigued and upon standing nausea   Reason for Disposition  Taking a medicine that could cause dizziness (e.g., blood pressure medications, diuretics)  Answer Assessment - Initial Assessment Questions 1. DESCRIPTION: Describe your dizziness.     Dizzy and nauseated, felt like going to pass out  2. LIGHTHEADED: Do you feel lightheaded? (e.g., somewhat faint, woozy, weak upon standing)     Yes, not at this time but has 5-10 minute episodes that come and go.  3. VERTIGO: Do you feel like either you or the room is spinning or tilting? (i.e., vertigo)     No.  4. SEVERITY: How bad is it?  Do you feel like you are going to faint? Can you stand and walk?     Able to stand and walk.  5. ONSET:  When did the dizziness begin?     First episode was about a week ago, another episode yesterday.  6. AGGRAVATING FACTORS: Does anything make it worse? (e.g., standing, change in head position)     Standing.  7. HEART RATE: Can you tell me your heart rate? How many beats in 15 seconds?  (Note: Not all patients can do this.)       No.  8. CAUSE: What do you think is causing the dizziness? (e.g., decreased fluids or food, diarrhea, emotional distress, heat exposure, new medicine, sudden standing, vomiting; unknown)     Unsure.  9. RECURRENT SYMPTOM: Have you had dizziness before? If Yes, ask: When was the last time? What  happened that time?     Yes, she was at the hospital years ago and had an episode where she passed out. She said they thought it was a stroke but was not diagnosed with a stroke and unsure what had caused it.  10. OTHER SYMPTOMS: Do you have any other symptoms? (e.g., fever, chest pain, vomiting, diarrhea, bleeding)       Fatigue: Just don't have any energy. Nausea all the time.  5-6/10 headache.  No fever, chest pain, difficulty breathing or swallowing, unilateral numbness or weakness, changes in speech or vision, LOC, palpitations, dehydration, unsteady gait, bleeding, black or bloody stool.  Protocols used: Dizziness - Lightheadedness-A-AH

## 2025-01-20 NOTE — Progress Notes (Unsigned)
 " Ph: [phone] Fax: [phone]   Patient ID: Morgan [name], female    DOB: 07-03-1951, 74 y.o.   MRN: [id number]  This visit was conducted in person.  BP (!) 144/92 (BP Location: Right Arm, Patient Position: Supine, Cuff Size: Normal)   Pulse 76   Temp 98.5 F (36.9 C) (Oral)   Ht 6' 1 (1.854 m)   Wt 187 lb (84.8 kg)   SpO2 100%   BMI 24.67 kg/m   Orthostatic Vitals for the past 48 hrs (Last 6 readings):  Patient Position Orthostatic BP Orthostatic Pulse BP Pulse BP Location Cuff Size  01/20/25 1227 Supine -- -- (!) 144/92 76 Right Arm Normal  01/20/25 1236 Standing 128/90 93 -- -- Left Arm Normal  01/20/25 1238 Standing (!) 138/94 -- -- -- Left Arm Normal   CC: weak lightheaded and fatigue  Subjective:   HPI: Morgan [name] is a 73 y.o. female presenting on 01/20/2025 for Acute Visit (Weak, no energy, lightheaded, dizzy episodes, 14 days/Pt having insomnia, no relief with OTC medication//FYI pt has Parkinsons, COPD and CHF, occ wears 3 L ox at home. Upon ambulation pt sp02 stays between 96-99)   Patient of Tabitha Dugal new to me with h/o CKD, IDA, CHF, COPD, GERD on pantoprazole  20mg  daily, HTN, HLD, MVP, possible Parkinsonism, TIA.   I don't have any energy ongoing for weeks. Episode 2 wks ago while in [employer] for 20-30 min started getting dizzy, weak, felt nauseated and like she was going to pass out. Went straight to car, sat and rested. Some nausea without vomiting, some dyspnea.  This happened [facility] at [facility].  No LOC  No fevers/chills, chest pain, palpitations.  Today she is still feeling weak, very fatigued.   Notes new paresthesias/numbness to toes > hands.  No new bone pain, abd pain She notes she's had several falls in the past year, latest 10/2024 - seen at [facility] for this. Scraped her hand and broke some ribs.  She denies alcohol  or recreational drug use.   Daytime sleepiness. Trouble sleeping at night - chronic insomnia for years. Takes OTC  sleep aide from [place] - still has trouble sleeping.  Denies frequent vivid dreams.   No h/o sleep apnea. Doesn't snore, no PNdyspnea, witnessed apnea. + frequent morning headaches, non restorative sleep.   Chronic fatigue and CKD stage 3 related anemia followed by hematology Dr Babara on b12 sublingual oral tablets daily.  Lab Results  Component Value Date   VITAMINB12 414 12/28/2024  Recently received first iron  Venofer  200mg  infusion through heme/onc on 01/04/2025 for iron  deficiency anemia. 2nd one scheduled next week (2/11). Oral iron  caused constipation.   Colonoscopy 08/2023.   Chronic occipital neuralgia with HA followed by [facility] most recently saw Allyson Stallion NP last month s/p occipital nerve block with temporary relief.   Parkinsonism - sees Dr Maree at [facility] on sinemet .  Known 40-69% R carotid stenosis last checked 11/2022, last saw [facility] at same time.      Relevant past medical, surgical, family and social history reviewed and updated as indicated. Interim medical history since our last visit reviewed. Allergies and medications reviewed and updated. Outpatient Medications Prior to Visit  Medication Sig Dispense Refill   acetaminophen  (TYLENOL ) 325 MG tablet Take 2 tablets (650 mg total) by mouth every 4 (four) hours as needed for mild pain (or temp > 37.5 C (99.5 F)).     albuterol  (VENTOLIN  HFA) 108 (90 Base) MCG/ACT inhaler  Inhale 2 puffs into the lungs every 6 (six) hours as needed for wheezing or shortness of breath. 1 each 11   Budeson-Glycopyrrol-Formoterol  (BREZTRI  AEROSPHERE) 160-9-4.8 MCG/ACT AERO Inhale 2 puffs into the lungs in the morning and at bedtime. 1 each 11   busPIRone  (BUSPAR ) 5 MG tablet TAKE 1 TABLET BY MOUTH TWICE A DAY 180 tablet 1   carbidopa -levodopa  (SINEMET  IR) 25-100 MG tablet Take 2 tablets by mouth 2 (two) times daily.     Cyanocobalamin  (VITAMIN B-12) 1000 MCG SUBL Place 1 tablet (1,000 mcg total) under the tongue daily.     EPINEPHrine   0.3 mg/0.3 mL IJ SOAJ injection Inject 0.3 mg into the muscle as needed for anaphylaxis. 1 each 0   fluticasone  (FLONASE ) 50 MCG/ACT nasal spray SPRAY 2 SPRAYS INTO EACH NOSTRIL EVERY DAY 48 mL 2   folic acid  (FOLVITE ) 1 MG tablet TAKE 1 TABLET BY MOUTH EVERY DAY 90 tablet 1   hydrOXYzine  (VISTARIL ) 50 MG capsule TAKE 1 CAPSULE BY MOUTH THREE TIMES A DAY AS NEEDED 270 capsule 1   ibuprofen  (ADVIL ) 400 MG tablet Take 1 tablet (400 mg total) by mouth every 8 (eight) hours as needed for headache or moderate pain. 20 tablet 0   lamoTRIgine (LAMICTAL) 25 MG tablet Take 50 mg by mouth 2 (two) times daily.     LORazepam  (ATIVAN ) 0.5 MG tablet Take 1 tablet (0.5 mg total) by mouth at bedtime as needed for anxiety. 30 tablet 0   ondansetron  (ZOFRAN ) 4 MG tablet Take 1 tablet (4 mg total) by mouth every 8 (eight) hours as needed for nausea or vomiting. 20 tablet 0   OXYGEN  Inhale 3 L/min into the lungs See admin instructions. 3 L/min at bedtime and as needed for shortness of breath throughout the day     potassium chloride  SA (KLOR-CON  M) 20 MEQ tablet TAKE 1 TABLET BY MOUTH EVERY DAY 90 tablet 1   promethazine  (PHENERGAN ) 25 MG tablet Take 1 tablet (25 mg total) by mouth every 6 (six) hours as needed. 30 tablet 0   sertraline  (ZOLOFT ) 100 MG tablet TAKE 1 TABLET BY MOUTH EVERY DAY 90 tablet 3   topiramate  (TOPAMAX ) 50 MG tablet TAKE 1 TABLET BY MOUTH EVERYDAY AT BEDTIME 90 tablet 1   Vitamin D , Ergocalciferol , (DRISDOL) 1.25 MG (50000 UNIT) CAPS capsule Take 50,000 Units by mouth once a week.     phenazopyridine  (PYRIDIUM ) 100 MG tablet Take 1 tablet (100 mg total) by mouth 3 (three) times daily as needed for pain. 6 tablet 0   tiZANidine  (ZANAFLEX ) 4 MG tablet Take 1 tablet (4 mg total) by mouth every 8 (eight) hours as needed for muscle spasms. 30 tablet 0   NUCALA 100 MG/ML SOAJ  (Patient not taking: Reported on 01/20/2025)     pregabalin  (LYRICA ) 50 MG capsule Take 1 capsule (50 mg total) by mouth 3  (three) times daily. (Patient not taking: Reported on 01/20/2025) 90 capsule 1   No facility-administered medications prior to visit.     Per HPI unless specifically indicated in ROS section below Review of Systems  Objective:  BP (!) 144/92 (BP Location: Right Arm, Patient Position: Supine, Cuff Size: Normal)   Pulse 76   Temp 98.5 F (36.9 C) (Oral)   Ht 6' 1 (1.854 m)   Wt 187 lb (84.8 kg)   SpO2 100%   BMI 24.67 kg/m   Wt Readings from Last 3 Encounters:  01/20/25 187 lb (84.8 kg)  01/04/25 185 lb  9.6 oz (84.2 kg)  10/09/24 198 lb (89.8 kg)      Physical Exam Vitals and nursing note reviewed.  Constitutional:      Appearance: Normal appearance. She is not ill-appearing.  HENT:     Head: Normocephalic and atraumatic.     Mouth/Throat:     Mouth: Mucous membranes are moist.     Pharynx: Oropharynx is clear. No oropharyngeal exudate or posterior oropharyngeal erythema.  Eyes:     Extraocular Movements: Extraocular movements intact.     Conjunctiva/sclera: Conjunctivae normal.     Pupils: Pupils are equal, round, and reactive to light.  Neck:     Thyroid : No thyroid  mass or thyromegaly.     Vascular: No carotid bruit.  Cardiovascular:     Rate and Rhythm: Normal rate and regular rhythm.     Pulses: Normal pulses.     Heart sounds: Normal heart sounds. No murmur heard. Pulmonary:     Effort: Pulmonary effort is normal. No respiratory distress.     Breath sounds: Normal breath sounds. No wheezing, rhonchi or rales.  Musculoskeletal:     Cervical back: Normal range of motion and neck supple.     Right lower leg: No edema.     Left lower leg: No edema.  Lymphadenopathy:     Cervical: No cervical adenopathy.  Skin:    General: Skin is warm and dry.     Capillary Refill: Capillary refill takes less than 2 seconds.     Findings: No rash.  Neurological:     Mental Status: She is alert.     Cranial Nerves: Cranial nerves 2-12 are intact.     Sensory: Sensation is  intact.     Motor: Motor function is intact.     Coordination: Romberg sign negative. Coordination normal. Finger-Nose-Finger Test normal.     Gait: Gait is intact.     Comments:  CN 2-12 intact EOMI FTN intact Unsteady with romberg but without ataxia  Psychiatric:        Mood and Affect: Mood normal.        Behavior: Behavior normal.       Results for orders placed or performed in visit on 12/28/24  Vitamin B12   Collection Time: 12/28/24  8:03 AM  Result Value Ref Range   Vitamin B-12 414 180 - 914 pg/mL  Retic Panel   Collection Time: 12/28/24  8:03 AM  Result Value Ref Range   Retic Ct Pct 1.1 0.4 - 3.1 %   RBC. 4.36 3.87 - 5.11 MIL/uL   Retic Count, Absolute 48.0 19.0 - 186.0 K/uL   Immature Retic Fract 4.9 2.3 - 15.9 %   Reticulocyte Hemoglobin 29.8 >27.9 pg  Ferritin   Collection Time: 12/28/24  8:03 AM  Result Value Ref Range   Ferritin 107 11 - 307 ng/mL  Iron  and TIBC   Collection Time: 12/28/24  8:03 AM  Result Value Ref Range   Iron  64 28 - 170 ug/dL   TIBC 695 749 - 549 ug/dL   Saturation Ratios 21 10.4 - 31.8 %   UIBC 240 ug/dL  CMP (Cancer Center only)   Collection Time: 12/28/24  8:03 AM  Result Value Ref Range   Sodium 136 135 - 145 mmol/L   Potassium 3.9 3.5 - 5.1 mmol/L   Chloride 100 98 - 111 mmol/L   CO2 23 22 - 32 mmol/L   Glucose, Bld 107 (H) 70 - 99 mg/dL   BUN 32 (H)  8 - 23 mg/dL   Creatinine 8.93 (H) 9.55 - 1.00 mg/dL   Calcium  9.4 8.9 - 10.3 mg/dL   Total Protein 6.8 6.5 - 8.1 g/dL   Albumin 4.2 3.5 - 5.0 g/dL   AST 16 15 - 41 U/L   ALT 9 0 - 44 U/L   Alkaline Phosphatase 120 38 - 126 U/L   Total Bilirubin 0.2 0.0 - 1.2 mg/dL   GFR, Estimated 56 (L) >60 mL/min   Anion gap 13 5 - 15  CBC with Differential (Cancer Center Only)   Collection Time: 12/28/24  8:03 AM  Result Value Ref Range   WBC Count 7.8 4.0 - 10.5 K/uL   RBC 4.31 3.87 - 5.11 MIL/uL   Hemoglobin 11.2 (L) 12.0 - 15.0 g/dL   HCT 65.1 (L) 63.9 - 53.9 %   MCV 80.7  80.0 - 100.0 fL   MCH 26.0 26.0 - 34.0 pg   MCHC 32.2 30.0 - 36.0 g/dL   RDW 84.9 88.4 - 84.4 %   Platelet Count 272 150 - 400 K/uL   nRBC 0.0 0.0 - 0.2 %   Neutrophils Relative % 65 %   Neutro Abs 5.1 1.7 - 7.7 K/uL   Lymphocytes Relative 27 %   Lymphs Abs 2.1 0.7 - 4.0 K/uL   Monocytes Relative 5 %   Monocytes Absolute 0.4 0.1 - 1.0 K/uL   Eosinophils Relative 2 %   Eosinophils Absolute 0.2 0.0 - 0.5 K/uL   Basophils Relative 0 %   Basophils Absolute 0.0 0.0 - 0.1 K/uL   Immature Granulocytes 1 %   Abs Immature Granulocytes 0.04 0.00 - 0.07 K/uL      01/20/2025    1:14 PM 01/20/2025   12:47 PM 09/02/2024    9:22 AM 08/12/2024    8:27 AM 05/19/2024    9:05 AM  Depression screen PHQ 2/9  Decreased Interest 1 1 1 1 1   Down, Depressed, Hopeless 1 1 1 1 1   PHQ - 2 Score 2 2 2 2 2   Altered sleeping 2 2 1 1 1   Tired, decreased energy 3 3 1 1 1   Change in appetite 1 1 1 1 1   Feeling bad or failure about yourself  1 1 1 1 1   Trouble concentrating 1 1 0 0 0  Moving slowly or fidgety/restless 0 0 0 0 0  Suicidal thoughts 0 0 0 0 0  PHQ-9 Score 10 10 6  6  6    Difficult doing work/chores Somewhat difficult Somewhat difficult Somewhat difficult Somewhat difficult Somewhat difficult     Data saved with a previous flowsheet row definition       01/20/2025    1:13 PM 09/02/2024    9:22 AM 08/12/2024    8:27 AM 05/19/2024    9:05 AM  GAD 7 : Generalized Anxiety Score  Nervous, Anxious, on Edge 1 1  1  1    Control/stop worrying 1 0  1  1   Worry too much - different things 0 0  0  1   Trouble relaxing 0 1  1  1    Restless 0 0  0  0   Easily annoyed or irritable 1 1  1  1    Afraid - awful might happen 0 1  0  0   Total GAD 7 Score 3 4 4 5   Anxiety Difficulty Somewhat difficult Somewhat difficult Somewhat difficult Somewhat difficult     Data saved with a previous flowsheet  row definition   ESS = 2  Assessment & Plan:   Problem List Items Addressed This Visit   None    No orders  of the defined types were placed in this encounter.   No orders of the defined types were placed in this encounter.   Patient Instructions  Call to schedule vascular surgeon follow up (last seen 11/2022)   Follow up plan: No follow-ups on file.  Anton Blas, MD   "

## 2025-01-20 NOTE — Telephone Encounter (Signed)
Will see then. 

## 2025-01-20 NOTE — Patient Instructions (Addendum)
 Labs today I will order carotid ultrasound as you're due. Call to schedule vascular surgeon follow up (last seen 11/2022) Could consider further evaluation for sleep apnea although screening test ok today.  If ongoing dizziness, check in with Dr Maree again.  Also schedule follow up with Tabitha to review medicines that could contribute to dizziness.

## 2025-01-20 NOTE — Telephone Encounter (Signed)
 NOTED

## 2025-01-21 ENCOUNTER — Ambulatory Visit: Payer: Self-pay | Admitting: Family Medicine

## 2025-01-21 LAB — VITAMIN D 25 HYDROXY (VIT D DEFICIENCY, FRACTURES): VITD: 26.6 ng/mL — ABNORMAL LOW (ref 30.00–100.00)

## 2025-01-21 LAB — TSH: TSH: 2.64 u[IU]/mL (ref 0.35–5.50)

## 2025-01-21 NOTE — Assessment & Plan Note (Addendum)
 Overdue for [facility] f/u.  BP mildly elevated but overall stable kidney function (GFR high 50s).

## 2025-01-21 NOTE — Assessment & Plan Note (Addendum)
 Newly endorsed ?parkinsonism related - rec f/u with neuro for this.

## 2025-01-21 NOTE — Assessment & Plan Note (Signed)
 BP remains mildly elevated off antihypertensives, noted h/o renal artery stenosis.

## 2025-01-21 NOTE — Assessment & Plan Note (Addendum)
 With unsteadiness on exam with romberg, as well as endorsing symptoms of periph neuropathy. ?parkinsonism related Several of her meds could also contribute to fatigue dizziness and unsteadiness. Would start with decreasing hydroxyzine  use, consider trial off buspar .  Consider outpatient PT for balance training/fall prevention.  Recent B12 normal.  Update TSH

## 2025-01-21 NOTE — Assessment & Plan Note (Signed)
 Last imaged on HRCT chest 09/2023 - 4.4 cm ascending aortic aneurysm, minimally increased from 4.2 cm on 08/08/2022. Will touch base with patient and PCP about rpt imaging with CTA chest.

## 2025-01-21 NOTE — Assessment & Plan Note (Signed)
 Stable period on daily oral sublingual b12 replacement.

## 2025-01-21 NOTE — Assessment & Plan Note (Signed)
 Overdue for f/u - will order rpt carotid US .  Last check 11/2022 RICA 40-59%, LICA 1-39% She is not on statin.

## 2025-01-21 NOTE — Assessment & Plan Note (Signed)
 On sinemet  through neurology Avelina).

## 2025-01-21 NOTE — Assessment & Plan Note (Addendum)
 This seems to be a chronic concern. Unclear cause.  Recent lab work reviewed. She just received 1st of Venofer  iron  infusions - hopeful this may help. Will update TSH and vit D levels to further evaluate for other reversible causes of fatigue.  She is on low dose buspar  and high dose hydroxyzine  - consider medication adjustment.  Consider OSA although negative ESS screen today (2).  STOP-BANG at elevated risk (3)

## 2025-01-21 NOTE — Assessment & Plan Note (Signed)
 Update levels today.  Will need to verify what vitamin D  replacement she is taking.

## 2025-01-21 NOTE — Assessment & Plan Note (Signed)
 On sertraline  100mg  daily, buspar  5mg  bid, hydroxyzine  50mg  PRN and lorazepam  0.5mg  PRN. She note she uses hydroxyzine  more regularly than lorazepam  as finds it more helpful for anxiety.  She is also on lamictal and topamax .  Many of these meds may contribute to fatigue and unsteadiness - see below.

## 2025-01-27 ENCOUNTER — Inpatient Hospital Stay

## 2025-04-07 ENCOUNTER — Encounter: Admitting: Family

## 2025-04-07 ENCOUNTER — Ambulatory Visit

## 2025-05-04 ENCOUNTER — Inpatient Hospital Stay

## 2025-05-04 ENCOUNTER — Inpatient Hospital Stay: Admitting: Oncology
